# Patient Record
Sex: Female | Born: 1940
Health system: Southern US, Community
[De-identification: ages and names within clinical notes are randomized; demographics above are authoritative.]

## PROBLEM LIST (undated history)

## (undated) DIAGNOSIS — Z8542 Personal history of malignant neoplasm of other parts of uterus: Secondary | ICD-10-CM

## (undated) DIAGNOSIS — N182 Chronic kidney disease, stage 2 (mild): Secondary | ICD-10-CM

## (undated) DIAGNOSIS — M1990 Unspecified osteoarthritis, unspecified site: Secondary | ICD-10-CM

## (undated) DIAGNOSIS — E119 Type 2 diabetes mellitus without complications: Secondary | ICD-10-CM

## (undated) DIAGNOSIS — Z801 Family history of malignant neoplasm of trachea, bronchus and lung: Secondary | ICD-10-CM

## (undated) DIAGNOSIS — I429 Cardiomyopathy, unspecified: Secondary | ICD-10-CM

## (undated) DIAGNOSIS — I2510 Atherosclerotic heart disease of native coronary artery without angina pectoris: Secondary | ICD-10-CM

## (undated) DIAGNOSIS — Z8049 Family history of malignant neoplasm of other genital organs: Secondary | ICD-10-CM

## (undated) DIAGNOSIS — Z806 Family history of leukemia: Secondary | ICD-10-CM

## (undated) DIAGNOSIS — E785 Hyperlipidemia, unspecified: Secondary | ICD-10-CM

## (undated) DIAGNOSIS — I509 Heart failure, unspecified: Secondary | ICD-10-CM

## (undated) DIAGNOSIS — J449 Chronic obstructive pulmonary disease, unspecified: Secondary | ICD-10-CM

## (undated) DIAGNOSIS — Z87442 Personal history of urinary calculi: Secondary | ICD-10-CM

## (undated) DIAGNOSIS — I10 Essential (primary) hypertension: Secondary | ICD-10-CM

## (undated) DIAGNOSIS — G8929 Other chronic pain: Secondary | ICD-10-CM

## (undated) DIAGNOSIS — I4892 Unspecified atrial flutter: Secondary | ICD-10-CM

## (undated) DIAGNOSIS — M545 Low back pain, unspecified: Secondary | ICD-10-CM

## (undated) DIAGNOSIS — Z808 Family history of malignant neoplasm of other organs or systems: Secondary | ICD-10-CM

## (undated) DIAGNOSIS — I214 Non-ST elevation (NSTEMI) myocardial infarction: Secondary | ICD-10-CM

## (undated) DIAGNOSIS — Z800 Family history of malignant neoplasm of digestive organs: Secondary | ICD-10-CM

## (undated) DIAGNOSIS — C801 Malignant (primary) neoplasm, unspecified: Secondary | ICD-10-CM

## (undated) DIAGNOSIS — K859 Acute pancreatitis without necrosis or infection, unspecified: Secondary | ICD-10-CM

## (undated) DIAGNOSIS — Z9289 Personal history of other medical treatment: Secondary | ICD-10-CM

## (undated) DIAGNOSIS — K869 Disease of pancreas, unspecified: Secondary | ICD-10-CM

## (undated) DIAGNOSIS — K635 Polyp of colon: Secondary | ICD-10-CM

## (undated) DIAGNOSIS — C55 Malignant neoplasm of uterus, part unspecified: Secondary | ICD-10-CM

## (undated) HISTORY — PX: ABDOMINAL HYSTERECTOMY: SHX81

## (undated) HISTORY — DX: Disease of pancreas, unspecified: K86.9

## (undated) HISTORY — DX: Atherosclerotic heart disease of native coronary artery without angina pectoris: I25.10

## (undated) HISTORY — DX: Type 2 diabetes mellitus without complications: E11.9

## (undated) HISTORY — PX: GANGLION CYST EXCISION: SHX1691

## (undated) HISTORY — DX: Personal history of malignant neoplasm of other parts of uterus: Z85.42

## (undated) HISTORY — PX: EXCISIONAL HEMORRHOIDECTOMY: SHX1541

## (undated) HISTORY — DX: Essential (primary) hypertension: I10

## (undated) HISTORY — DX: Hyperlipidemia, unspecified: E78.5

## (undated) HISTORY — PX: TUBAL LIGATION: SHX77

## (undated) HISTORY — DX: Family history of malignant neoplasm of digestive organs: Z80.0

## (undated) HISTORY — PX: DILATION AND CURETTAGE OF UTERUS: SHX78

## (undated) HISTORY — DX: Chronic kidney disease, stage 2 (mild): N18.2

## (undated) HISTORY — DX: Family history of malignant neoplasm of other organs or systems: Z80.8

## (undated) HISTORY — DX: Family history of malignant neoplasm of other genital organs: Z80.49

## (undated) HISTORY — DX: Acute pancreatitis without necrosis or infection, unspecified: K85.90

## (undated) HISTORY — DX: Family history of leukemia: Z80.6

## (undated) HISTORY — PX: NASAL SINUS SURGERY: SHX719

## (undated) HISTORY — PX: CATARACT EXTRACTION W/ INTRAOCULAR LENS  IMPLANT, BILATERAL: SHX1307

## (undated) HISTORY — DX: Family history of malignant neoplasm of trachea, bronchus and lung: Z80.1

## (undated) HISTORY — DX: Polyp of colon: K63.5

---

## 1999-07-14 ENCOUNTER — Emergency Department (HOSPITAL_COMMUNITY): Admission: EM | Admit: 1999-07-14 | Discharge: 1999-07-14 | Payer: Self-pay | Admitting: Emergency Medicine

## 1999-07-15 ENCOUNTER — Encounter: Payer: Self-pay | Admitting: *Deleted

## 2000-06-24 ENCOUNTER — Encounter: Admission: RE | Admit: 2000-06-24 | Discharge: 2000-06-24 | Payer: Self-pay | Admitting: Internal Medicine

## 2000-06-24 ENCOUNTER — Encounter: Payer: Self-pay | Admitting: Internal Medicine

## 2000-12-08 HISTORY — PX: CARDIAC CATHETERIZATION: SHX172

## 2001-03-23 ENCOUNTER — Encounter: Payer: Self-pay | Admitting: Emergency Medicine

## 2001-03-24 ENCOUNTER — Encounter: Payer: Self-pay | Admitting: Internal Medicine

## 2001-03-24 ENCOUNTER — Inpatient Hospital Stay (HOSPITAL_COMMUNITY): Admission: EM | Admit: 2001-03-24 | Discharge: 2001-03-27 | Payer: Self-pay | Admitting: Emergency Medicine

## 2001-03-26 ENCOUNTER — Encounter: Payer: Self-pay | Admitting: Internal Medicine

## 2001-06-25 ENCOUNTER — Encounter: Admission: RE | Admit: 2001-06-25 | Discharge: 2001-06-25 | Payer: Self-pay | Admitting: Internal Medicine

## 2001-06-25 ENCOUNTER — Encounter: Payer: Self-pay | Admitting: Internal Medicine

## 2002-07-25 ENCOUNTER — Encounter: Admission: RE | Admit: 2002-07-25 | Discharge: 2002-07-25 | Payer: Self-pay | Admitting: Obstetrics and Gynecology

## 2002-07-25 ENCOUNTER — Encounter: Payer: Self-pay | Admitting: Internal Medicine

## 2003-09-27 ENCOUNTER — Encounter: Admission: RE | Admit: 2003-09-27 | Discharge: 2003-09-27 | Payer: Self-pay | Admitting: Internal Medicine

## 2003-09-27 ENCOUNTER — Encounter: Payer: Self-pay | Admitting: Internal Medicine

## 2004-10-28 ENCOUNTER — Ambulatory Visit: Payer: Self-pay | Admitting: Internal Medicine

## 2004-11-27 ENCOUNTER — Ambulatory Visit (HOSPITAL_COMMUNITY): Admission: RE | Admit: 2004-11-27 | Discharge: 2004-11-27 | Payer: Self-pay | Admitting: Internal Medicine

## 2005-01-31 ENCOUNTER — Ambulatory Visit: Payer: Self-pay | Admitting: Internal Medicine

## 2005-07-30 ENCOUNTER — Ambulatory Visit: Payer: Self-pay | Admitting: Internal Medicine

## 2005-07-30 ENCOUNTER — Other Ambulatory Visit: Admission: RE | Admit: 2005-07-30 | Discharge: 2005-07-30 | Payer: Self-pay | Admitting: Internal Medicine

## 2007-01-01 ENCOUNTER — Encounter (INDEPENDENT_AMBULATORY_CARE_PROVIDER_SITE_OTHER): Payer: Self-pay | Admitting: Specialist

## 2007-01-01 ENCOUNTER — Ambulatory Visit: Payer: Self-pay | Admitting: Internal Medicine

## 2007-01-01 ENCOUNTER — Ambulatory Visit (HOSPITAL_COMMUNITY): Admission: RE | Admit: 2007-01-01 | Discharge: 2007-01-01 | Payer: Self-pay | Admitting: Internal Medicine

## 2007-01-01 ENCOUNTER — Other Ambulatory Visit: Admission: RE | Admit: 2007-01-01 | Discharge: 2007-01-01 | Payer: Self-pay | Admitting: Internal Medicine

## 2007-01-21 ENCOUNTER — Encounter: Admission: RE | Admit: 2007-01-21 | Discharge: 2007-01-21 | Payer: Self-pay | Admitting: Internal Medicine

## 2007-01-21 ENCOUNTER — Ambulatory Visit: Payer: Self-pay | Admitting: Internal Medicine

## 2007-02-13 ENCOUNTER — Emergency Department (HOSPITAL_COMMUNITY): Admission: EM | Admit: 2007-02-13 | Discharge: 2007-02-13 | Payer: Self-pay | Admitting: Emergency Medicine

## 2007-03-01 ENCOUNTER — Ambulatory Visit: Payer: Self-pay | Admitting: Internal Medicine

## 2007-03-04 ENCOUNTER — Encounter: Admission: RE | Admit: 2007-03-04 | Discharge: 2007-03-04 | Payer: Self-pay | Admitting: Internal Medicine

## 2007-04-12 ENCOUNTER — Ambulatory Visit: Payer: Self-pay | Admitting: Internal Medicine

## 2007-06-28 ENCOUNTER — Telehealth: Payer: Self-pay | Admitting: Internal Medicine

## 2007-07-13 ENCOUNTER — Ambulatory Visit: Payer: Self-pay | Admitting: Internal Medicine

## 2007-07-13 DIAGNOSIS — I10 Essential (primary) hypertension: Secondary | ICD-10-CM | POA: Insufficient documentation

## 2007-07-13 DIAGNOSIS — E119 Type 2 diabetes mellitus without complications: Secondary | ICD-10-CM

## 2007-07-13 DIAGNOSIS — Z794 Long term (current) use of insulin: Secondary | ICD-10-CM

## 2007-07-13 DIAGNOSIS — IMO0001 Reserved for inherently not codable concepts without codable children: Secondary | ICD-10-CM | POA: Insufficient documentation

## 2007-07-13 HISTORY — DX: Essential (primary) hypertension: I10

## 2007-07-13 HISTORY — DX: Type 2 diabetes mellitus without complications: E11.9

## 2007-08-30 ENCOUNTER — Telehealth (INDEPENDENT_AMBULATORY_CARE_PROVIDER_SITE_OTHER): Payer: Self-pay | Admitting: *Deleted

## 2007-09-07 ENCOUNTER — Ambulatory Visit: Payer: Self-pay | Admitting: Internal Medicine

## 2007-09-07 DIAGNOSIS — R609 Edema, unspecified: Secondary | ICD-10-CM | POA: Insufficient documentation

## 2007-10-20 ENCOUNTER — Encounter: Payer: Self-pay | Admitting: Internal Medicine

## 2007-11-03 ENCOUNTER — Ambulatory Visit: Payer: Self-pay | Admitting: Internal Medicine

## 2007-12-24 ENCOUNTER — Ambulatory Visit: Payer: Self-pay | Admitting: Internal Medicine

## 2007-12-24 DIAGNOSIS — R0789 Other chest pain: Secondary | ICD-10-CM | POA: Insufficient documentation

## 2007-12-24 DIAGNOSIS — I2510 Atherosclerotic heart disease of native coronary artery without angina pectoris: Secondary | ICD-10-CM | POA: Insufficient documentation

## 2007-12-24 DIAGNOSIS — E785 Hyperlipidemia, unspecified: Secondary | ICD-10-CM | POA: Insufficient documentation

## 2007-12-24 DIAGNOSIS — Z87442 Personal history of urinary calculi: Secondary | ICD-10-CM

## 2007-12-24 HISTORY — DX: Atherosclerotic heart disease of native coronary artery without angina pectoris: I25.10

## 2007-12-24 HISTORY — DX: Personal history of urinary calculi: Z87.442

## 2007-12-24 HISTORY — DX: Hyperlipidemia, unspecified: E78.5

## 2008-01-26 ENCOUNTER — Ambulatory Visit: Payer: Self-pay | Admitting: Internal Medicine

## 2008-01-26 DIAGNOSIS — M25559 Pain in unspecified hip: Secondary | ICD-10-CM | POA: Insufficient documentation

## 2008-03-20 ENCOUNTER — Encounter: Admission: RE | Admit: 2008-03-20 | Discharge: 2008-03-20 | Payer: Self-pay | Admitting: Internal Medicine

## 2008-05-23 ENCOUNTER — Ambulatory Visit: Payer: Self-pay | Admitting: Internal Medicine

## 2008-06-20 ENCOUNTER — Ambulatory Visit: Payer: Self-pay | Admitting: Internal Medicine

## 2008-10-03 ENCOUNTER — Ambulatory Visit: Payer: Self-pay | Admitting: Internal Medicine

## 2009-01-05 ENCOUNTER — Ambulatory Visit: Payer: Self-pay | Admitting: Internal Medicine

## 2009-01-05 LAB — CONVERTED CEMR LAB: Hgb A1c MFr Bld: 8.2 % — ABNORMAL HIGH (ref 4.6–6.1)

## 2009-03-06 ENCOUNTER — Telehealth: Payer: Self-pay | Admitting: Internal Medicine

## 2009-03-06 ENCOUNTER — Encounter: Payer: Self-pay | Admitting: Internal Medicine

## 2009-04-09 ENCOUNTER — Encounter: Admission: RE | Admit: 2009-04-09 | Discharge: 2009-04-09 | Payer: Self-pay | Admitting: Internal Medicine

## 2009-04-09 ENCOUNTER — Ambulatory Visit: Payer: Self-pay | Admitting: Internal Medicine

## 2009-04-12 ENCOUNTER — Ambulatory Visit: Payer: Self-pay | Admitting: Internal Medicine

## 2009-05-25 ENCOUNTER — Telehealth: Payer: Self-pay | Admitting: Internal Medicine

## 2009-07-09 ENCOUNTER — Ambulatory Visit: Payer: Self-pay | Admitting: Internal Medicine

## 2009-08-06 ENCOUNTER — Telehealth: Payer: Self-pay | Admitting: Internal Medicine

## 2009-08-27 ENCOUNTER — Encounter (INDEPENDENT_AMBULATORY_CARE_PROVIDER_SITE_OTHER): Payer: Self-pay

## 2009-09-10 ENCOUNTER — Telehealth: Payer: Self-pay | Admitting: Internal Medicine

## 2009-10-09 ENCOUNTER — Ambulatory Visit: Payer: Self-pay | Admitting: Internal Medicine

## 2009-11-14 ENCOUNTER — Telehealth (INDEPENDENT_AMBULATORY_CARE_PROVIDER_SITE_OTHER): Payer: Self-pay

## 2009-11-23 ENCOUNTER — Telehealth (INDEPENDENT_AMBULATORY_CARE_PROVIDER_SITE_OTHER): Payer: Self-pay

## 2009-12-08 HISTORY — PX: COLONOSCOPY W/ BIOPSIES AND POLYPECTOMY: SHX1376

## 2009-12-13 ENCOUNTER — Telehealth: Payer: Self-pay | Admitting: Internal Medicine

## 2009-12-31 ENCOUNTER — Telehealth: Payer: Self-pay | Admitting: Internal Medicine

## 2010-01-10 ENCOUNTER — Ambulatory Visit: Payer: Self-pay | Admitting: Internal Medicine

## 2010-01-10 LAB — CONVERTED CEMR LAB
Glucose, Urine, Semiquant: >=1000
Nitrite: NEGATIVE
Protein, U semiquant: 100
Specific Gravity, Urine: >=1.03
Urobilinogen, UA: 0.2
pH: 5

## 2010-01-25 ENCOUNTER — Encounter: Payer: Self-pay | Admitting: Internal Medicine

## 2010-04-12 ENCOUNTER — Ambulatory Visit: Payer: Self-pay | Admitting: Internal Medicine

## 2010-04-12 ENCOUNTER — Encounter (INDEPENDENT_AMBULATORY_CARE_PROVIDER_SITE_OTHER): Payer: Self-pay | Admitting: *Deleted

## 2010-04-12 ENCOUNTER — Encounter: Admission: RE | Admit: 2010-04-12 | Discharge: 2010-04-12 | Payer: Self-pay | Admitting: Internal Medicine

## 2010-04-12 LAB — CONVERTED CEMR LAB: Hgb A1c MFr Bld: 7.3 % — ABNORMAL HIGH (ref <5.7)

## 2010-04-17 ENCOUNTER — Encounter (INDEPENDENT_AMBULATORY_CARE_PROVIDER_SITE_OTHER): Payer: Self-pay | Admitting: *Deleted

## 2010-04-19 ENCOUNTER — Ambulatory Visit: Payer: Self-pay | Admitting: Internal Medicine

## 2010-04-19 ENCOUNTER — Encounter (INDEPENDENT_AMBULATORY_CARE_PROVIDER_SITE_OTHER): Payer: Self-pay | Admitting: *Deleted

## 2010-04-24 ENCOUNTER — Telehealth (INDEPENDENT_AMBULATORY_CARE_PROVIDER_SITE_OTHER): Payer: Self-pay | Admitting: *Deleted

## 2010-04-29 ENCOUNTER — Ambulatory Visit: Payer: Self-pay | Admitting: Internal Medicine

## 2010-04-29 DIAGNOSIS — K635 Polyp of colon: Secondary | ICD-10-CM

## 2010-04-29 HISTORY — DX: Polyp of colon: K63.5

## 2010-04-29 LAB — HM COLONOSCOPY

## 2010-04-30 ENCOUNTER — Encounter: Payer: Self-pay | Admitting: Internal Medicine

## 2010-05-29 ENCOUNTER — Telehealth: Payer: Self-pay | Admitting: Internal Medicine

## 2010-07-12 ENCOUNTER — Ambulatory Visit: Payer: Self-pay | Admitting: Internal Medicine

## 2010-08-20 ENCOUNTER — Encounter: Payer: Self-pay | Admitting: Internal Medicine

## 2010-08-21 ENCOUNTER — Encounter: Payer: Self-pay | Admitting: Internal Medicine

## 2010-10-10 ENCOUNTER — Ambulatory Visit: Payer: Self-pay | Admitting: Internal Medicine

## 2010-10-10 DIAGNOSIS — R82998 Other abnormal findings in urine: Secondary | ICD-10-CM | POA: Insufficient documentation

## 2010-10-10 LAB — CONVERTED CEMR LAB
Ketones, urine, test strip: NEGATIVE
Specific Gravity, Urine: 1.02
Urobilinogen, UA: 0.2

## 2010-11-25 ENCOUNTER — Telehealth: Payer: Self-pay | Admitting: Internal Medicine

## 2010-12-29 ENCOUNTER — Encounter: Payer: Self-pay | Admitting: Emergency Medicine

## 2011-01-02 ENCOUNTER — Encounter: Payer: Self-pay | Admitting: Internal Medicine

## 2011-01-05 LAB — CONVERTED CEMR LAB
AST: 18 units/L (ref 0–37)
Basophils Absolute: 0 10*3/uL (ref 0.0–0.1)
Bilirubin, Direct: 0.2 mg/dL (ref 0.0–0.3)
CO2: 30 meq/L (ref 19–32)
Calcium: 9.4 mg/dL (ref 8.4–10.5)
Chloride: 107 meq/L (ref 96–112)
Cholesterol: 123 mg/dL (ref 0–200)
Eosinophils Relative: 1.6 % (ref 0.0–5.0)
GFR calc non Af Amer: 76 mL/min
Glucose, Bld: 223 mg/dL — ABNORMAL HIGH (ref 70–99)
HCT: 33.3 % — ABNORMAL LOW (ref 36.0–46.0)
HCT: 39.1 % (ref 36.0–46.0)
HDL: 24.7 mg/dL — ABNORMAL LOW (ref >39.0)
Hgb A1c MFr Bld: 12.9 % — ABNORMAL HIGH (ref 4.6–6.0)
LDL Cholesterol: 66 mg/dL (ref 0–99)
Lymphocytes Relative: 20.4 % (ref 12.0–46.0)
MCHC: 34.7 g/dL (ref 30.0–36.0)
MCHC: 35.6 g/dL (ref 30.0–36.0)
MCV: 83.5 fL (ref 78.0–100.0)
Monocytes Absolute: 0.6 10*3/uL (ref 0.2–0.7)
Monocytes Relative: 8.6 % (ref 3.0–11.0)
Neutro Abs: 5.2 10*3/uL (ref 1.4–7.7)
Neutrophils Relative %: 69.3 % (ref 43.0–77.0)
Platelets: 198 10*3/uL (ref 150–400)
RBC: 3.99 M/uL (ref 3.87–5.11)
RDW: 12.6 % (ref 11.5–14.6)
Sodium: 143 meq/L (ref 135–145)
TSH: 0.78 microintl units/mL (ref 0.35–5.50)
Total Bilirubin: 0.9 mg/dL (ref 0.3–1.2)
Total CHOL/HDL Ratio: 5
Total Protein: 6.4 g/dL (ref 6.0–8.3)
Triglycerides: 160 mg/dL — ABNORMAL HIGH (ref 0–149)
WBC: 4.6 10*3/uL (ref 4.5–10.5)

## 2011-01-07 NOTE — Progress Notes (Signed)
Summary: prep ?  Phone Note Call from Patient Call back at Work Phone [phone]   Caller: Patient Call For: Dr. Henrene Pastor Reason for Call: Talk to Nurse Summary of Call: cannot afford prep Initial call taken by: Lucien Mons,  Apr 24, 2010 8:24 AM  Follow-up for Phone Call        Pt. states she cannot afford Moviprep even with the rebate coupon.  Compliment Movi Prep given to pt.  Follow-up by: Emerson Monte RN,  Apr 24, 2010 9:56 AM

## 2011-01-07 NOTE — Miscellaneous (Signed)
Summary: [facility] PV.  Clinical Lists Changes  Medications: Added new medication of MOVIPREP 100 GM  SOLR (PEG-KCL-NACL-NASULF-NA ASC-C) As per prep instructions. - Signed Rx of MOVIPREP 100 GM  SOLR (PEG-KCL-NACL-NASULF-NA ASC-C) As per prep instructions.;  #1 x 0;  Signed;  Entered by: Ulice Dash RN;  Authorized by: Irene Shipper MD;  Method used: Electronically to [facility] [phone]*, [address], [place], [place]  [address], Ph: [phone], Fax: [phone] Observations: Added new observation of NKA: T (04/19/2010 16:20)    Prescriptions: MOVIPREP 100 GM  SOLR (PEG-KCL-NACL-NASULF-NA ASC-C) As per prep instructions.  #1 x 0   Entered by:   Ulice Dash RN   Authorized by:   Irene Shipper MD   Signed by:   Ulice Dash RN on 04/19/2010   Method used:   Electronically to        [facility] [phone]* (retail)       [address]       [place], [place]  [address]       Ph: [phone]       Fax: [phone]   RxID:   [id number]

## 2011-01-07 NOTE — Miscellaneous (Signed)
Summary: Flu Shot/[facility]  [facility]   Imported By: Laural Benes 08/26/2010 09:30:36  _____________________________________________________________________  External Attachment:    Type:   Image     Comment:   External Document

## 2011-01-07 NOTE — Progress Notes (Signed)
Summary: lost rx - hydrocodone-acetamin.  Phone Note Call from Patient Call back at Work Phone [phone]   Caller: Patient Call For: Catherine Lor  MD Summary of Call: pt lost rx hydrocodone Initial call taken by: Glo Herring,  May 29, 2010 10:27 AM  Follow-up for Phone Call        #90 Follow-up by: Catherine Lor  MD,  May 29, 2010 12:19 PM  Additional Follow-up for Phone Call Additional follow up Details #1::        spoke with pt - informed that I would call in #90 but no RF per Dr. Burnice Logan - she states she is using 3 every day - I told her he may want to she her for future rf since this is as needed and she is using daily at least three times a day - verbalixed understanding.   [facility]. KIK Additional Follow-up by: Cay Schillings LPN,  June 22, 624THL 624THL PM    Prescriptions: HYDROCODONE-ACETAMINOPHEN 5-500 MG  TABS (HYDROCODONE-ACETAMINOPHEN) one every 6 hours for pain  #90 x 0   Entered by:   Cay Schillings LPN   Authorized by:   Catherine Lor  MD   Signed by:   Cay Schillings LPN on [id number]   Method used:   Historical   RxIDVK:[id number]

## 2011-01-07 NOTE — Assessment & Plan Note (Signed)
Summary: 3 month rov/njr   Vital Signs:  Patient profile:   70 year old female Weight:      193 pounds Temp:     98.1 degrees F oral BP sitting:   110 / 62  (left arm) Cuff size:   regular  Vitals Entered By: Cay Schillings LPN (May  6, 624THL 579FGE PM) CC: 3 mos rov - doing well     fbs 139 Is Patient Diabetic? Yes Did you bring your meter with you today? No   CC:  3 mos rov - doing well     fbs 139.  History of Present Illness:  70 year old patient who is seen today for follow up of her type 2 diabetes.  Medical regimen now includes Byetta.  She has hypertension, coronary artery disease, dyslipidemia.  She denies any cardiopulmonary complaints. No screening colonoscopy in excess of 10 years. She remains on simvastatin, which he tolerates well.  She does continue to have occasional nausea with Byetta  Preventive Screening-Counseling & Management  Alcohol-Tobacco     Smoking Status: quit  Allergies (verified): No Known Drug Allergies  Past History:  Past Medical History: Reviewed history from 12/24/2007 and no changes required. Diabetes mellitus, type II Hypertension Coronary artery disease Hyperlipidemia thyroid not dural Nephrolithiasis, hx of  Past Surgical History: Reviewed history from 12/24/2007 and no changes required. Hemorrhoidectomy Hysterectomy Sinus surgery heart catheterization 2002  Family History: Reviewed history from 12/24/2007 and no changes required. father died age 24, throat cancer, history of COPD  Four brothers, one died of throat cancer.  Another suicide death 74 sisters, one died of uterine cancer; history of breast cancer  Review of Systems       The patient complains of weight gain.  The patient denies anorexia, fever, weight loss, vision loss, decreased hearing, hoarseness, chest pain, syncope, dyspnea on exertion, peripheral edema, prolonged cough, headaches, hemoptysis, abdominal pain, melena, hematochezia, severe  indigestion/heartburn, hematuria, incontinence, genital sores, muscle weakness, suspicious skin lesions, transient blindness, difficulty walking, depression, unusual weight change, abnormal bleeding, enlarged lymph nodes, angioedema, and breast masses.    Physical Exam  General:  overweight-appearing.   130/60overweight-appearing.   Head:  Normocephalic and atraumatic without obvious abnormalities. No apparent alopecia or balding. Eyes:  No corneal or conjunctival inflammation noted. EOMI. Perrla. Funduscopic exam benign, without hemorrhages, exudates or papilledema. Vision grossly normal. Ears:  External ear exam shows no significant lesions or deformities.  Otoscopic examination reveals clear canals, tympanic membranes are intact bilaterally without bulging, retraction, inflammation or discharge. Hearing is grossly normal bilaterally. Mouth:  Oral mucosa and oropharynx without lesions or exudates.  Teeth in good repair. Neck:  No deformities, masses, or tenderness noted. Lungs:  Normal respiratory effort, chest expands symmetrically. Lungs are clear to auscultation, no crackles or wheezes. Heart:  Normal rate and regular rhythm. S1 and S2 normal without gallop, murmur, click, rub or other extra sounds. Abdomen:  Bowel sounds positive,abdomen soft and non-tender without masses, organomegaly or hernias noted. Msk:  No deformity or scoliosis noted of thoracic or lumbar spine.   Extremities:  No clubbing, cyanosis, edema, or deformity noted with normal full range of motion of all joints.   Skin:  Intact without suspicious lesions or rashes Cervical Nodes:  No lymphadenopathy noted Axillary Nodes:  No palpable lymphadenopathy Inguinal Nodes:  No significant adenopathy   Impression & Recommendations:  Problem # 1:  CORONARY ARTERY DISEASE (ICD-414.00)  The following medications were removed from the medication list:    Furosemide  40 Mg Tabs (Furosemide) ..... One daily Her updated medication  list for this problem includes:    Bayer Aspirin 325 Mg Tabs (Aspirin) .Marland Kitchen... Take 1 tablet by mouth once a day    Furosemide 40 Mg Tabs (Furosemide) ..... One daily    Benazepril Hcl 40 Mg Tabs (Benazepril hcl) ..... One daily    Amlodipine Besylate 5 Mg Tabs (Amlodipine besylate) ..... One daily  The following medications were removed from the medication list:    Furosemide 40 Mg Tabs (Furosemide) ..... One daily Her updated medication list for this problem includes:    Bayer Aspirin 325 Mg Tabs (Aspirin) .Marland Kitchen... Take 1 tablet by mouth once a day    Furosemide 40 Mg Tabs (Furosemide) ..... One daily    Benazepril Hcl 40 Mg Tabs (Benazepril hcl) ..... One daily    Amlodipine Besylate 5 Mg Tabs (Amlodipine besylate) ..... One daily  Problem # 2:  HYPERLIPIDEMIA (ICD-272.4)  Her updated medication list for this problem includes:    Simvastatin 20 Mg Tabs (Simvastatin) .Marland Kitchen... Take 1 tablet by mouth every night  Her updated medication list for this problem includes:    Simvastatin 20 Mg Tabs (Simvastatin) .Marland Kitchen... Take 1 tablet by mouth every night  Problem # 3:  HYPERTENSION (ICD-401.9)  The following medications were removed from the medication list:    Furosemide 40 Mg Tabs (Furosemide) ..... One daily Her updated medication list for this problem includes:    Furosemide 40 Mg Tabs (Furosemide) ..... One daily    Benazepril Hcl 40 Mg Tabs (Benazepril hcl) ..... One daily    Amlodipine Besylate 5 Mg Tabs (Amlodipine besylate) ..... One daily  The following medications were removed from the medication list:    Furosemide 40 Mg Tabs (Furosemide) ..... One daily Her updated medication list for this problem includes:    Furosemide 40 Mg Tabs (Furosemide) ..... One daily    Benazepril Hcl 40 Mg Tabs (Benazepril hcl) ..... One daily    Amlodipine Besylate 5 Mg Tabs (Amlodipine besylate) ..... One daily  Problem # 4:  DIABETES MELLITUS, TYPE II (ICD-250.00)  Her updated medication list for  this problem includes:    Bayer Aspirin 325 Mg Tabs (Aspirin) .Marland Kitchen... Take 1 tablet by mouth once a day    Glucophage 1000 Mg Tabs (Metformin hcl) .Marland Kitchen... 1 two times a day    Glyburide 5 Mg Tabs (Glyburide) .Marland Kitchen... 1 two times a day    Benazepril Hcl 40 Mg Tabs (Benazepril hcl) ..... One daily    Byetta 10 Mcg Pen 10 Mcg/0.68ml Soln (Exenatide) ..... Use twice daily    Her updated medication list for this problem includes:    Bayer Aspirin 325 Mg Tabs (Aspirin) .Marland Kitchen... Take 1 tablet by mouth once a day    Glucophage 1000 Mg Tabs (Metformin hcl) .Marland Kitchen... 1 two times a day    Glyburide 5 Mg Tabs (Glyburide) .Marland Kitchen... 1 two times a day    Benazepril Hcl 40 Mg Tabs (Benazepril hcl) ..... One daily    Byetta 10 Mcg Pen 10 Mcg/0.63ml Soln (Exenatide) ..... Use twice daily  Orders: Venipuncture [id number]) TLB-A1C / Hgb A1C (Glycohemoglobin) (83036-A1C) Prescription Created Electronically [phone])  Complete Medication List: 1)  Bayer Aspirin 325 Mg Tabs (Aspirin) .... Take 1 tablet by mouth once a day 2)  Glucophage 1000 Mg Tabs (Metformin hcl) .Marland Kitchen.. 1 two times a day 3)  Glyburide 5 Mg Tabs (Glyburide) .Marland Kitchen.. 1 two times a day 4)  Lorazepam 0.5 Mg Tabs (  Lorazepam) .... Q6h as needed 5)  Simvastatin 20 Mg Tabs (Simvastatin) .... Take 1 tablet by mouth every night 6)  Furosemide 40 Mg Tabs (Furosemide) .... One daily 7)  Benazepril Hcl 40 Mg Tabs (Benazepril hcl) .... One daily 8)  Freestyle Lite Strp (Glucose blood) 9)  Meloxicam 7.5 Mg Tabs (Meloxicam) .... One twice daily as needed 10)  Hydrocodone-acetaminophen 5-500 Mg Tabs (Hydrocodone-acetaminophen) .... One every 6 hours for pain 11)  Bd Insulin Syringe Ultrafine 31g X 5/16" 0.3 Ml Misc (Insulin syringe-needle u-100) .... Use as directed 12)  Onetouch Ultra Test Strp (Glucose blood) .... Use daily 13)  Amlodipine Besylate 5 Mg Tabs (Amlodipine besylate) .... One daily 14)  Byetta 10 Mcg Pen 10 Mcg/0.107ml Soln (Exenatide) .... Use twice daily 15)   Ciprofloxacin Hcl 500 Mg Tabs (Ciprofloxacin hcl) .... One twice daily  Other Orders: Gastroenterology Referral (GI)  Patient Instructions: 1)  Please schedule a follow-up appointment in 3 months. 2)  Limit your Sodium (Salt). 3)  It is important that you exercise regularly at least 20 minutes 5 times a week. If you develop chest pain, have severe difficulty breathing, or feel very tired , stop exercising immediately and seek medical attention. 4)  You need to lose weight. Consider a lower calorie diet and regular exercise.  5)  Check your blood sugars regularly. If your readings are usually above : or below 70 you should contact our office. 6)  It is important that your Diabetic A1c level is checked every 3 months. 7)  See your eye doctor yearly to check for diabetic eye damage. Prescriptions: HYDROCODONE-ACETAMINOPHEN 5-500 MG  TABS (HYDROCODONE-ACETAMINOPHEN) one every 6 hours for pain  #90 x 3   Entered and Authorized by:   Marletta Lor  MD   Signed by:   Marletta Lor  MD on 04/12/2010   Method used:   Print then Give to Patient   RxID:   [id number] LORAZEPAM 0.5 MG TABS (LORAZEPAM) q6h as needed  #50 x 2   Entered and Authorized by:   Marletta Lor  MD   Signed by:   Marletta Lor  MD on 04/12/2010   Method used:   Print then Give to Patient   RxID:   [id number] BYETTA 10 MCG PEN 10 MCG/0.04ML SOLN (EXENATIDE) use twice daily  #3 pens x 6   Entered and Authorized by:   Marletta Lor  MD   Signed by:   Marletta Lor  MD on 04/12/2010   Method used:   Electronically to        [facility] [phone]* (retail)       [place], [place]  [address]       Ph: [phone]       Fax: [phone]   RxIDNL:[id number] AMLODIPINE BESYLATE 5 MG TABS (AMLODIPINE BESYLATE) one daily  #90 x 4   Entered and Authorized by:   Marletta Lor  MD   Signed by:   Marletta Lor  MD on 04/12/2010   Method used:    Electronically to        [facility] [phone]* (retail)       [address]       [place], [place]  [address]       Ph: [phone]       Fax: [phone]   RxID:   [id number] Donald Siva  TEST   STRP (GLUCOSE BLOOD) use daily  #180 x 6   Entered and Authorized by:   Marletta Lor  MD   Signed by:   Marletta Lor  MD on 04/12/2010   Method used:   Electronically to        [facility] [phone]* (retail)       [address]       [place], [place]  [address]       Ph: [phone]       Fax: [phone]   RxID:   [id number] BD INSULIN SYRINGE ULTRAFINE 31G X 5/16" 0.3 ML  MISC (INSULIN SYRINGE-NEEDLE U-100) use as directed  #180 x 6   Entered and Authorized by:   Marletta Lor  MD   Signed by:   Marletta Lor  MD on 04/12/2010   Method used:   Electronically to        [facility] [phone]* (retail)       [address]       [place], [place]  [address]       Ph: [phone]       Fax: [phone]   [id number] MELOXICAM 7.5 MG  TABS (MELOXICAM) one twice daily as needed  #90 x 2   Entered and Authorized by:   Marletta Lor  MD   Signed by:   Marletta Lor  MD on 04/12/2010   Method used:   Electronically to        [facility] [phone]* (retail)       [place], [place]  [address]       Ph: [phone]       Fax: [phone]   RxID:   [id number] FREESTYLE LITE   STRP (GLUCOSE BLOOD)   #180 x 6   Entered and Authorized by:   Marletta Lor  MD   Signed by:   Marletta Lor  MD on 04/12/2010   Method used:   Print then Give to Patient   RxID:   [id number] BENAZEPRIL HCL 40 MG  TABS (BENAZEPRIL HCL) one daily  #90 Tablet x 4   Entered and Authorized by:   Marletta Lor  MD   Signed by:   Marletta Lor  MD on 04/12/2010   Method used:   Electronically to        [facility] [phone]* (retail)       [place], [place]   [address]       Ph: [phone]       Fax: [phone]   [id number] FUROSEMIDE 40 MG  TABS (FUROSEMIDE) one daily  #90 x 6   Entered and Authorized by:   Marletta Lor  MD   Signed by:   Marletta Lor  MD on 04/12/2010   Method used:   Electronically to        [facility] [phone]* (retail)       [place], [place]  [address]       Ph: [phone]       Fax: [phone]   [id number] SIMVASTATIN 20 MG TABS (SIMVASTATIN) Take 1 tablet by mouth every night  #90 x 6   Entered and Authorized by:  Marletta Lor  MD   Signed by:   Marletta Lor  MD on 04/12/2010   Method used:   Electronically to        [facility] [phone]* (retail)       [place], [place]  [address]       Ph: [phone]       Fax: [phone]   RxID:   [id number] GLYBURIDE 5 MG TABS (GLYBURIDE) 1 two times a day  #180 Tablet x 4   Entered and Authorized by:   Marletta Lor  MD   Signed by:   Marletta Lor  MD on 04/12/2010   Method used:   Electronically to        [facility] [phone]* (retail)       [place], [place]  [address]       Ph: [phone]       Fax: [phone]   RxIDZA:[id number] GLUCOPHAGE 1000 MG TABS (METFORMIN HCL) 1 two times a day  #180 Tablet x 4   Entered and Authorized by:   Marletta Lor  MD   Signed by:   Marletta Lor  MD on 04/12/2010   Method used:   Electronically to        [facility] [phone]* (retail)       [address]       [place], [place]  [address]       Ph: [phone]       Fax: [phone]   RxID:   [id number]

## 2011-01-07 NOTE — Progress Notes (Signed)
Summary: REQ FOR SAMPLES  Phone Note Call from Patient   Caller: Patient @ [phone] Reason for Call: Talk to Nurse Summary of Call: Pt called to speak with Dr Marthann Schiller Nurse Bethena Roys, RN)..... Pt req addtitional samples of diabetes pen (Victoza)...... Pt can be reached at [phone] with any questions or concerns.  Initial call taken by: Duanne Moron,  December 13, 2009 8:54 AM  Follow-up for Phone Call        Pt called back to check on status of samples. Please call today asap. [id number] Follow-up by: Braulio Bosch,  December 14, 2009 12:47 PM  Additional Follow-up for Phone Call Additional follow up Details #1::        none here, Rx called in Additional Follow-up by: Chipper Oman, RN,  December 14, 2009 1:03 PM    Prescriptions: VICTOZA 18 MG/3ML SOLN (LIRAGLUTIDE) 0.6 mg daily  #1 x 6   Entered by:   Chipper Oman, RN   Authorized by:   Marletta Lor  MD   Signed by:   Chipper Oman, RN on 12/14/2009   Method used:   Electronically to        [facility] [phone]* (retail)       [address]       [place], [place]  [address]       Ph: [phone]       Fax: [phone]   [id number]

## 2011-01-07 NOTE — Letter (Signed)
Summary: Moviprep Instructions  [facility]  [address]   [place], [place] [address]   Phone: [phone]  Fax: [phone]       TOLEEN SPRADLIN    1941/03/02    MRN: [id number]        Procedure Day Sudie Grumbling: Monday, 04-29-10     Arrival Time: 9:30 a.m.     Procedure Time: 10:30 a.m.     Location of Procedure:                    x   [facility] (4th Floor)                        [place]   Starting 5 days prior to your procedure 04-24-10 do not eat nuts, seeds, popcorn, corn, beans, peas,  salads, or any raw vegetables.  Do not take any fiber supplements (e.g. Metamucil, Citrucel, and Benefiber).  THE DAY BEFORE YOUR PROCEDURE         DATE: 04-28-10    DAY: Sunday  1.  Drink clear liquids the entire day-NO SOLID FOOD  2.  Do not drink anything colored red or purple.  Avoid juices with pulp.  No orange juice.  3.  Drink at least 64 oz. (8 glasses) of fluid/clear liquids during the day to prevent dehydration and help the prep work efficiently.  CLEAR LIQUIDS INCLUDE: Water Jello Ice Popsicles Tea (sugar ok, no milk/cream) Powdered fruit flavored drinks Coffee (sugar ok, no milk/cream) Gatorade Juice: apple, white grape, white cranberry  Lemonade Clear bullion, consomm, broth Carbonated beverages (any kind) Strained chicken noodle soup Hard Candy                             4.  In the morning, mix first dose of MoviPrep solution:    Empty 1 Pouch A and 1 Pouch B into the disposable container    Add lukewarm drinking water to the top line of the container. Mix to dissolve    Refrigerate (mixed solution should be used within 24 hrs)  5.  Begin drinking the prep at 5:00 p.m. The MoviPrep container is divided by 4 marks.   Every 15 minutes drink the solution down to the next mark (approximately 8 oz) until the full liter is complete.   6.  Follow completed prep with 16 oz of clear liquid of your choice  (Nothing red or purple).  Continue to drink clear liquids until bedtime.  7.  Before going to bed, mix second dose of MoviPrep solution:    Empty 1 Pouch A and 1 Pouch B into the disposable container    Add lukewarm drinking water to the top line of the container. Mix to dissolve    Refrigerate  THE DAY OF YOUR PROCEDURE      DATE: 04-29-10  DAY: Monday  Beginning at  5:30 a.m. (5 hours before procedure):         1. Every 15 minutes, drink the solution down to the next mark (approx 8 oz) until the full liter is complete.  2. Follow completed prep with 16 oz. of clear liquid of your choice.    3. You may drink clear liquids until  8:30 a.m.  (2 HOURS BEFORE PROCEDURE).   MEDICATION INSTRUCTIONS  Unless otherwise instructed, you should take regular prescription medications with a small sip of water   as  early as possible the morning of your procedure.  Diabetic patients - see separate instructions.   Additional medication instructions:  Do not take Furosemide day of procedure.         OTHER INSTRUCTIONS  You will need a responsible adult at least 69 years of age to accompany you and drive you home.   This person must remain in the waiting room during your procedure.  Wear loose fitting clothing that is easily removed.  Leave jewelry and other valuables at home.  However, you may wish to bring a book to read or  an iPod/MP3 player to listen to music as you wait for your procedure to start.  Remove all body piercing jewelry and leave at home.  Total time from sign-in until discharge is approximately 2-3 hours.  You should go home directly after your procedure and rest.  You can resume normal activities the  day after your procedure.  The day of your procedure you should not:   Drive   Make legal decisions   Operate machinery   Drink alcohol   Return to work  You will receive specific instructions about eating, activities and medications before you  leave.    The above instructions have been reviewed and explained to me by   Ulice Dash RN  Apr 19, 2010 5:11 PM     I fully understand and can verbalize these instructions _____________________________ Date _________

## 2011-01-07 NOTE — Miscellaneous (Signed)
Summary: flu vaccine  Clinical Lists Changes  Observations: Added new observation of FLU VAX: Historical (08/20/2010 9:43)      Immunization History:  Influenza Immunization History:    Influenza:  Historical (08/20/2010)  H1N1 History:    H1N1 # 1:  H1N1 vaccine G code (01/05/2009)

## 2011-01-07 NOTE — Progress Notes (Signed)
Summary: Name of Insulin Pen  Phone Note Other Incoming Call back at [phone]   Caller: Ammie Ferrier [employer]) Summary of Call: I am trying to get patient's insulin injection pen covered under her insurance and she does not know the name of it.  Please call back with the name of this med. Initial call taken by: Candace Cruise,  December 31, 2009 10:54 AM  Follow-up for Phone Call        called patient and told her she needed to give him that info. Follow-up by: Chipper Oman, RN,  December 31, 2009 11:40 AM

## 2011-01-07 NOTE — Letter (Signed)
Summary: Diabetic Instructions  [facility]  [place], [place] [address]   Phone: [phone]  Fax: [phone]    [name] [birth year] MRN: [id number]   _  _   ORAL DIABETIC MEDICATION INSTRUCTIONS  The day before your procedure:   Take your diabetic pill as you do normally  The day of your procedure:   Do not take your diabetic pill    We will check your blood sugar levels during the admission process and again in Recovery before discharging you home  ________________________________________________________________________ Hold Byetta day before procedure and day of procedure.

## 2011-01-07 NOTE — Medication Information (Signed)
Summary: Request for Exception-Vitoza Pen  Request for Exception-Vitoza Pen   Imported By: Laural Benes 01/29/2010 15:42:56  _____________________________________________________________________  External Attachment:    Type:   Image     Comment:   External Document

## 2011-01-07 NOTE — Letter (Signed)
Summary: Previsit letter  [facility]  [place], [place] [address]   Phone: [phone]  Fax: [phone]       04/12/2010 MRN: [id number]  [facility] [place], [place]  [address]  Dear Ms. Arreola,  Welcome to the Gastroenterology Division at [facility].    You are scheduled to see a nurse for your pre-procedure visit on 05-03-10 at 10:30a.m. on the 3rd floor at [facility], [place] [employer].  We ask that you try to arrive at our office 15 minutes prior to your appointment time to allow for check-in.  Your nurse visit will consist of discussing your medical and surgical history, your immediate family medical history, and your medications.    Please bring a complete list of all your medications or, if you prefer, bring the medication bottles and we will list them.  We will need to be aware of both prescribed and over the counter drugs.  We will need to know exact dosage information as well.  If you are on blood thinners (Coumadin, Plavix, Aggrenox, Ticlid, etc.) please call our office today/prior to your appointment, as we need to consult with your physician about holding your medication.   Please be prepared to read and sign documents such as consent forms, a financial agreement, and acknowledgement forms.  If necessary, and with your consent, a friend or relative is welcome to sit-in on the nurse visit with you.  Please bring your insurance card so that we may make a copy of it.  If your insurance requires a referral to see a specialist, please bring your referral form from your primary care physician.  No co-pay is required for this nurse visit.     If you cannot keep your appointment, please call [phone] to cancel or reschedule prior to your appointment date.  This allows [place] the opportunity to schedule an appointment for another patient in need of care.    Thank you for choosing [facility] for your medical needs.   We appreciate the opportunity to care for you.  Please visit [place] at our website  to learn more about our practice.                     Sincerely.                                                                                                                   The Gastroenterology Division

## 2011-01-07 NOTE — Assessment & Plan Note (Signed)
Summary: 3 month rov/njr   Vital Signs:  Patient profile:   70 year old female Weight:      191 pounds Temp:     98.4 degrees F oral BP sitting:   124 / 70  (right arm) Cuff size:   regular  Vitals Entered By: Cay Schillings LPN (August  5, 624THL 4:21 PM) CC: 3 mos rov - doing ok      fbs 87 Is Patient Diabetic? Yes Did you bring your meter with you today? No   CC:  3 mos rov - doing ok      fbs 87.  History of Present Illness:  70 year old patient who is seen today for follow-up of her type 2 diabetes.  She has dyslipidemia and hypertension.  Her last hemoglobin A1c7.3.  She has had some modest weight loss and has done quite well.  Since her last visit here.  She has had colonoscopy.  A fasting blood sugar this morning 89.  No concerns or complaints.  She does have a history of coronary artery disease, which has been stable  Allergies (verified): No Known Drug Allergies  Past History:  Past Medical History: Reviewed history from 12/24/2007 and no changes required. Diabetes mellitus, type II Hypertension Coronary artery disease Hyperlipidemia thyroid not dural Nephrolithiasis, hx of  Review of Systems       The patient complains of weight loss.  The patient denies anorexia, fever, weight gain, vision loss, decreased hearing, hoarseness, chest pain, syncope, dyspnea on exertion, peripheral edema, prolonged cough, headaches, hemoptysis, abdominal pain, melena, hematochezia, severe indigestion/heartburn, hematuria, incontinence, genital sores, muscle weakness, suspicious skin lesions, transient blindness, difficulty walking, depression, unusual weight change, abnormal bleeding, enlarged lymph nodes, angioedema, and breast masses.    Physical Exam  General:  overweight-appearing.  normal blood pressure Head:  Normocephalic and atraumatic without obvious abnormalities. No apparent alopecia or balding. Eyes:  No corneal or conjunctival inflammation noted. EOMI. Perrla.  Funduscopic exam benign, without hemorrhages, exudates or papilledema. Vision grossly normal. Mouth:  Oral mucosa and oropharynx without lesions or exudates.  Teeth in good repair. Neck:  No deformities, masses, or tenderness noted. Lungs:  Normal respiratory effort, chest expands symmetrically. Lungs are clear to auscultation, no crackles or wheezes. Heart:  Normal rate and regular rhythm. S1 and S2 normal without gallop, murmur, click, rub or other extra sounds. Abdomen:  Bowel sounds positive,abdomen soft and non-tender without masses, organomegaly or hernias noted.  Diabetes Management Exam:    Eye Exam:       Eye Exam done here today          Results: normal   Impression & Recommendations:  Problem # 1:  HYPERLIPIDEMIA (P102836.4)  Her updated medication list for this problem includes:    Simvastatin 20 Mg Tabs (Simvastatin) .Marland Kitchen... Take 1 tablet by mouth every night  Problem # 2:  CORONARY ARTERY DISEASE (ICD-414.00)  Her updated medication list for this problem includes:    Bayer Aspirin 325 Mg Tabs (Aspirin) .Marland Kitchen... Take 1 tablet by mouth once a day    Furosemide 40 Mg Tabs (Furosemide) ..... One daily    Benazepril Hcl 40 Mg Tabs (Benazepril hcl) ..... One daily    Amlodipine Besylate 5 Mg Tabs (Amlodipine besylate) ..... One daily  Problem # 3:  DIABETES MELLITUS, TYPE II (ICD-250.00)  Her updated medication list for this problem includes:    Bayer Aspirin 325 Mg Tabs (Aspirin) .Marland Kitchen... Take 1 tablet by mouth once a day  Glucophage 1000 Mg Tabs (Metformin hcl) .Marland Kitchen... 1 two times a day    Glyburide 5 Mg Tabs (Glyburide) .Marland Kitchen... 1 two times a day    Benazepril Hcl 40 Mg Tabs (Benazepril hcl) ..... One daily    Byetta 10 Mcg Pen 10 Mcg/0.80ml Soln (Exenatide) ..... Use twice daily patient declines hemoglobin A1c testing today.  Will check next R. O. the  Problem # 4:  HYPERTENSION (ICD-401.9)  Her updated medication list for this problem includes:    Furosemide 40 Mg Tabs  (Furosemide) ..... One daily    Benazepril Hcl 40 Mg Tabs (Benazepril hcl) ..... One daily    Amlodipine Besylate 5 Mg Tabs (Amlodipine besylate) ..... One daily  Complete Medication List: 1)  Bayer Aspirin 325 Mg Tabs (Aspirin) .... Take 1 tablet by mouth once a day 2)  Glucophage 1000 Mg Tabs (Metformin hcl) .Marland Kitchen.. 1 two times a day 3)  Glyburide 5 Mg Tabs (Glyburide) .Marland Kitchen.. 1 two times a day 4)  Lorazepam 0.5 Mg Tabs (Lorazepam) .... Q6h as needed 5)  Simvastatin 20 Mg Tabs (Simvastatin) .... Take 1 tablet by mouth every night 6)  Furosemide 40 Mg Tabs (Furosemide) .... One daily 7)  Benazepril Hcl 40 Mg Tabs (Benazepril hcl) .... One daily 8)  Freestyle Lite Strp (Glucose blood) 9)  Meloxicam 7.5 Mg Tabs (Meloxicam) .... One twice daily as needed 10)  Hydrocodone-acetaminophen 5-500 Mg Tabs (Hydrocodone-acetaminophen) .... One every 6 hours for pain 11)  Bd Insulin Syringe Ultrafine 31g X 5/16" 0.3 Ml Misc (Insulin syringe-needle u-100) .... Use as directed 12)  Onetouch Ultra Test Strp (Glucose blood) .... Use daily 13)  Amlodipine Besylate 5 Mg Tabs (Amlodipine besylate) .... One daily 14)  Byetta 10 Mcg Pen 10 Mcg/0.36ml Soln (Exenatide) .... Use twice daily 15)  Ciprofloxacin Hcl 500 Mg Tabs (Ciprofloxacin hcl) .... One twice daily  Patient Instructions: 1)  Please schedule a follow-up appointment in 3 months. 2)  Limit your Sodium (Salt). 3)  It is important that you exercise regularly at least 20 minutes 5 times a week. If you develop chest pain, have severe difficulty breathing, or feel very tired , stop exercising immediately and seek medical attention. 4)  You need to lose weight. Consider a lower calorie diet and regular exercise.  5)  Check your blood sugars regularly. If your readings are usually above : or below 70 you should contact our office. 6)  It is important that your Diabetic A1c level is checked every 3 months.

## 2011-01-07 NOTE — Assessment & Plan Note (Signed)
Summary: ROA X 3 MTHS /RS ALSO A1C/NJR   Vital Signs:  Patient profile:   70 year old female Weight:      181 pounds BP sitting:   124 / 84  (left arm) Cuff size:   regular  Vitals Entered By: Chipper Oman, RN (January 10, 2010 4:25 PM) CC: ROV, c/o urine burning skin. BS's 200's- can't afford Victoza. Is Patient Diabetic? Yes   CC:  ROV and c/o urine burning skin. BS's 200's- can't afford Victoza.Marland Kitchen  History of Present Illness:  70 year old patient who is seen today for follow-up of her type 2 diabetes.  She was placed on Victoza 3 months ago, but had a difficult time obtaining insurance coverage.  Her blood sugars did do quite well even on a submaximal dose of the medication.  Blood sugars more recently have been quite high, often in excess of 300 and she has not been able to obtain the medication.  She has hypertension, dyslipidemia, and a history of coronary artery disease.  Otherwise, her status has been stable.  Her only new complaint is burning, dysuria for several days.  She denies any fever, chills, or flank pain.  She denies any exertional chest pain or shortness of breath  Allergies: No Known Drug Allergies  Past History:  Past Medical History: Reviewed history from 12/24/2007 and no changes required. Diabetes mellitus, type II Hypertension Coronary artery disease Hyperlipidemia thyroid not dural Nephrolithiasis, hx of  Family History: Reviewed history from 12/24/2007 and no changes required. father died age 52, throat cancer, history of COPD  Four brothers, one died of throat cancer.  Another suicide death 6 sisters, one died of uterine cancer; history of breast cancer  Social History: Reviewed history from 12/24/2007 and no changes required. Former Smoker  Review of Systems  The patient denies anorexia, fever, weight loss, weight gain, vision loss, decreased hearing, hoarseness, chest pain, syncope, dyspnea on exertion, peripheral edema, prolonged cough,  headaches, hemoptysis, abdominal pain, melena, hematochezia, severe indigestion/heartburn, hematuria, incontinence, genital sores, muscle weakness, suspicious skin lesions, transient blindness, difficulty walking, depression, unusual weight change, abnormal bleeding, enlarged lymph nodes, angioedema, and breast masses.    Physical Exam  General:  overweight-appearing.  no distress.  Blood pressure 140/70 Head:  Normocephalic and atraumatic without obvious abnormalities. No apparent alopecia or balding. Eyes:  No corneal or conjunctival inflammation noted. EOMI. Perrla. Funduscopic exam benign, without hemorrhages, exudates or papilledema. Vision grossly normal. Mouth:  Oral mucosa and oropharynx without lesions or exudates.  Teeth in good repair. Neck:  No deformities, masses, or tenderness noted. Lungs:  Normal respiratory effort, chest expands symmetrically. Lungs are clear to auscultation, no crackles or wheezes. Heart:  Normal rate and regular rhythm. S1 and S2 normal without gallop, murmur, click, rub or other extra sounds. Abdomen:  Bowel sounds positive,abdomen soft and non-tender without masses, organomegaly or hernias noted. Msk:  No deformity or scoliosis noted of thoracic or lumbar spine.   Pulses:  R and L carotid,radial,femoral,dorsalis pedis and posterior tibial pulses are full and equal bilaterally Extremities:  No clubbing, cyanosis, edema, or deformity noted with normal full range of motion of all joints.     Impression & Recommendations:  Problem # 1:  DIABETES MELLITUS, TYPE II (ICD-250.00)  The following medications were removed from the medication list:    Victoza 18 Mg/51ml Soln (Liraglutide) .Marland Kitchen... 0.6 mg daily Her updated medication list for this problem includes:    Bayer Aspirin 325 Mg Tabs (Aspirin) .Marland Kitchen... Take  1 tablet by mouth once a day    Glucophage 1000 Mg Tabs (Metformin hcl) .Marland Kitchen... 1 two times a day    Glyburide 5 Mg Tabs (Glyburide) .Marland Kitchen... 1 two times a day     Benazepril Hcl 40 Mg Tabs (Benazepril hcl) ..... One daily    Byetta 10 Mcg Pen 10 Mcg/0.73ml Soln (Exenatide) ..... Use twice daily  Orders: UA Dipstick w/o Micro (manual) (81002)  Problem # 2:  CORONARY ARTERY DISEASE (ICD-414.00)  Her updated medication list for this problem includes:    Bayer Aspirin 325 Mg Tabs (Aspirin) .Marland Kitchen... Take 1 tablet by mouth once a day    Furosemide 40 Mg Tabs (Furosemide) ..... One daily    Benazepril Hcl 40 Mg Tabs (Benazepril hcl) ..... One daily    Amlodipine Besylate 5 Mg Tabs (Amlodipine besylate) ..... One daily  Problem # 3:  HYPERTENSION (ICD-401.9)  Her updated medication list for this problem includes:    Furosemide 40 Mg Tabs (Furosemide) ..... One daily    Benazepril Hcl 40 Mg Tabs (Benazepril hcl) ..... One daily    Amlodipine Besylate 5 Mg Tabs (Amlodipine besylate) ..... One daily  Problem # 4:  UTI (ICD-599.0)  Her updated medication list for this problem includes:    Ciprofloxacin Hcl 500 Mg Tabs (Ciprofloxacin hcl) ..... One twice daily  Complete Medication List: 1)  Bayer Aspirin 325 Mg Tabs (Aspirin) .... Take 1 tablet by mouth once a day 2)  Glucophage 1000 Mg Tabs (Metformin hcl) .Marland Kitchen.. 1 two times a day 3)  Glyburide 5 Mg Tabs (Glyburide) .Marland Kitchen.. 1 two times a day 4)  Lorazepam 0.5 Mg Tabs (Lorazepam) .... Q6h as needed 5)  Simvastatin 20 Mg Tabs (Simvastatin) .... Take 1 tablet by mouth every night 6)  Furosemide 40 Mg Tabs (Furosemide) .... One daily 7)  Benazepril Hcl 40 Mg Tabs (Benazepril hcl) .... One daily 8)  Freestyle Lite Strp (Glucose blood) 9)  Meloxicam 7.5 Mg Tabs (Meloxicam) .... One twice daily as needed 10)  Hydrocodone-acetaminophen 5-500 Mg Tabs (Hydrocodone-acetaminophen) .... One every 6 hours for pain 11)  Bd Insulin Syringe Ultrafine 31g X 5/16" 0.3 Ml Misc (Insulin syringe-needle u-100) .... Use as directed 12)  Furosemide 40 Mg Tabs (Furosemide) .... One daily 13)  Onetouch Ultra Test Strp (Glucose blood)  .... Use daily 14)  Amlodipine Besylate 5 Mg Tabs (Amlodipine besylate) .... One daily 15)  Byetta 10 Mcg Pen 10 Mcg/0.26ml Soln (Exenatide) .... Use twice daily 16)  Ciprofloxacin Hcl 500 Mg Tabs (Ciprofloxacin hcl) .... One twice daily  Patient Instructions: 1)  Please schedule a follow-up appointment in 3 months. 2)  Limit your Sodium (Salt). 3)  It is important that you exercise regularly at least 20 minutes 5 times a week. If you develop chest pain, have severe difficulty breathing, or feel very tired , stop exercising immediately and seek medical attention. 4)  You need to lose weight. Consider a lower calorie diet and regular exercise.  5)  Check your blood sugars regularly. If your readings are usually above : or below 70 you should contact our office. 6)  It is important that your Diabetic A1c level is checked every 3 months. 7)  See your eye doctor yearly to check for diabetic eye damage. Prescriptions: CIPROFLOXACIN HCL 500 MG TABS (CIPROFLOXACIN HCL) one twice daily  #1and a4 x 0   Entered and Authorized by:   Marletta Lor  MD   Signed by:   Marletta Lor  MD on 01/10/2010   Method used:   Print then Give to Patient   RxID:   [id number] BYETTA 10 MCG PEN 10 MCG/0.04ML SOLN (EXENATIDE) use twice daily  #3 pens x 6   Entered and Authorized by:   Marletta Lor  MD   Signed by:   Marletta Lor  MD on 01/10/2010   Method used:   Print then Give to Patient   RxID:   [id number] AMLODIPINE BESYLATE 5 MG TABS (AMLODIPINE BESYLATE) one daily  #90 x 4   Entered and Authorized by:   Marletta Lor  MD   Signed by:   Marletta Lor  MD on 01/10/2010   Method used:   Print then Give to Patient   RxID:   [id number] Glory [place] ULTRA TEST   STRP (GLUCOSE BLOOD) use daily  #180 x 6   Entered and Authorized by:   Marletta Lor  MD   Signed by:   Marletta Lor  MD on 01/10/2010   Method used:   Print then Give to Patient   RxID:    [id number] BD INSULIN SYRINGE ULTRAFINE 31G X 5/16" 0.3 ML  MISC (INSULIN SYRINGE-NEEDLE U-100) use as directed  #180 x 6   Entered and Authorized by:   Marletta Lor  MD   Signed by:   Marletta Lor  MD on 01/10/2010   Method used:   Print then Give to Patient   RxID:   [id number] MELOXICAM 7.5 MG  TABS (MELOXICAM) one twice daily as needed  #90 x 2   Entered and Authorized by:   Marletta Lor  MD   Signed by:   Marletta Lor  MD on 01/10/2010   Method used:   Print then Give to Patient   RxID:   [id number] FREESTYLE LITE   STRP (GLUCOSE BLOOD)   #180 x 6   Entered and Authorized by:   Marletta Lor  MD   Signed by:   Marletta Lor  MD on 01/10/2010   Method used:   Print then Give to Patient   RxID:   [id number] BENAZEPRIL HCL 40 MG  TABS (BENAZEPRIL HCL) one daily  #90 Tablet x 4   Entered and Authorized by:   Marletta Lor  MD   Signed by:   Marletta Lor  MD on 01/10/2010   Method used:   Print then Give to Patient   [id number] FUROSEMIDE 40 MG  TABS (FUROSEMIDE) one daily  #90 x 6   Entered and Authorized by:   Marletta Lor  MD   Signed by:   Marletta Lor  MD on 01/10/2010   Method used:   Print then Give to Patient   RxID:   [id number] SIMVASTATIN 20 MG TABS (SIMVASTATIN) Take 1 tablet by mouth every night  #90 x 6   Entered and Authorized by:   Marletta Lor  MD   Signed by:   Marletta Lor  MD on 01/10/2010   Method used:   Print then Give to Patient   RxID:   [id number] GLYBURIDE 5 MG TABS (GLYBURIDE) 1 two times a day  #180 Tablet x 4   Entered and Authorized by:   Marletta Lor  MD   Signed by:   Marletta Lor  MD on 01/10/2010   Method used:   Print then Give to Patient   RxID:   [id number]  GLUCOPHAGE 1000 MG TABS (METFORMIN HCL) 1 two times a day  #180 Tablet x 4   Entered and Authorized by:   Marletta Lor  MD    Signed by:   Marletta Lor  MD on 01/10/2010   Method used:   Print then Give to Patient   RxID:   [id number] LORAZEPAM 0.5 MG TABS (LORAZEPAM) q6h as needed  #50 x 2   Entered and Authorized by:   Marletta Lor  MD   Signed by:   Marletta Lor  MD on 01/10/2010   Method used:   Print then Give to Patient   RxID:   [id number]   Laboratory Results   Urine Tests    Routine Urinalysis   Color: yellow Appearance: Clear Glucose: >=1000   (Normal Range: Negative) Bilirubin: small   (Normal Range: Negative) Ketone: small (15)   (Normal Range: Negative) Spec. Gravity: >=1.030   (Normal Range: 1.003-1.035) Blood: negative   (Normal Range: Negative) pH: 5.0   (Normal Range: 5.0-8.0) Protein: 100   (Normal Range: Negative) Urobilinogen: 0.2   (Normal Range: 0-1) Nitrite: negative   (Normal Range: Negative) Leukocyte Esterace: small   (Normal Range: Negative)

## 2011-01-07 NOTE — Assessment & Plan Note (Signed)
Summary: 3 month rov/njr   Vital Signs:  Patient profile:   70 year old female Weight:      194 pounds Temp:     98.7 degrees F oral BP sitting:   160 / 80  (right arm) Cuff size:   regular  Vitals Entered By: Cay Schillings LPN (November  3, 624THL 4:48 PM) CC: 3 mos rov - doing well - c/o urine very smelly     fbs 150 Is Patient Diabetic? Yes Did you bring your meter with you today? No   CC:  3 mos rov - doing well - c/o urine very smelly     fbs 150.  History of Present Illness: 70 year old patient who has a history of hypertension and type 2 diabetes.  She has a history also of dyslipidemia.  For the past several days.  She has had increasing frequency and darker urine.  She has also noted more odor to the urine.  Her diabetic status has been about the same.  Medical regimen includes oral therapy, as well as Byetta.  Denies any hypoglycemic symptoms  Allergies (verified): No Known Drug Allergies  Past History:  Past Medical History: Reviewed history from 12/24/2007 and no changes required. Diabetes mellitus, type II Hypertension Coronary artery disease Hyperlipidemia thyroid not dural Nephrolithiasis, hx of  Review of Systems  The patient denies anorexia, fever, weight loss, weight gain, vision loss, decreased hearing, hoarseness, chest pain, syncope, dyspnea on exertion, peripheral edema, prolonged cough, headaches, hemoptysis, abdominal pain, melena, hematochezia, severe indigestion/heartburn, hematuria, incontinence, genital sores, muscle weakness, suspicious skin lesions, transient blindness, difficulty walking, depression, unusual weight change, abnormal bleeding, enlarged lymph nodes, angioedema, and breast masses.    Physical Exam  General:  overweight-appearing.  140/78 Head:  Normocephalic and atraumatic without obvious abnormalities. No apparent alopecia or balding. Eyes:  No corneal or conjunctival inflammation noted. EOMI. Perrla. Funduscopic exam benign,  without hemorrhages, exudates or papilledema. Vision grossly normal. Mouth:  Oral mucosa and oropharynx without lesions or exudates.  Teeth in good repair. Neck:  No deformities, masses, or tenderness noted. Lungs:  Normal respiratory effort, chest expands symmetrically. Lungs are clear to auscultation, no crackles or wheezes. Heart:  Normal rate and regular rhythm. S1 and S2 normal without gallop, murmur, click, rub or other extra sounds. Abdomen:  Bowel sounds positive,abdomen soft and non-tender without masses, organomegaly or hernias noted. Msk:  No deformity or scoliosis noted of thoracic or lumbar spine.   Extremities:  No clubbing, cyanosis, edema, or deformity noted with normal full range of motion of all joints.     Impression & Recommendations:  Problem # 1:  UTI (ICD-599.0)  Her updated medication list for this problem includes:    Ciprofloxacin Hcl 500 Mg Tabs (Ciprofloxacin hcl) ..... One twice daily  Problem # 2:  HYPERLIPIDEMIA (ICD-272.4)  Her updated medication list for this problem includes:    Simvastatin 20 Mg Tabs (Simvastatin) .Marland Kitchen... Take 1 tablet by mouth every night  Problem # 3:  HYPERTENSION (ICD-401.9)  Her updated medication list for this problem includes:    Furosemide 40 Mg Tabs (Furosemide) ..... One daily    Benazepril Hcl 40 Mg Tabs (Benazepril hcl) ..... One daily    Amlodipine Besylate 5 Mg Tabs (Amlodipine besylate) ..... One daily  Problem # 4:  DIABETES MELLITUS, TYPE II (ICD-250.00)  Her updated medication list for this problem includes:    Bayer Aspirin 325 Mg Tabs (Aspirin) .Marland Kitchen... Take 1 tablet by mouth once a day  Glucophage 1000 Mg Tabs (Metformin hcl) .Marland Kitchen... 1 two times a day    Glyburide 5 Mg Tabs (Glyburide) .Marland Kitchen... 1 two times a day    Benazepril Hcl 40 Mg Tabs (Benazepril hcl) ..... One daily    Byetta 10 Mcg Pen 10 Mcg/0.78ml Soln (Exenatide) ..... Use twice daily will check a hemoglobin A1C next visit.  Unfortunately, it is after 5  o'clock  and  our lab has closed  Complete Medication List: 1)  Bayer Aspirin 325 Mg Tabs (Aspirin) .... Take 1 tablet by mouth once a day 2)  Glucophage 1000 Mg Tabs (Metformin hcl) .Marland Kitchen.. 1 two times a day 3)  Glyburide 5 Mg Tabs (Glyburide) .Marland Kitchen.. 1 two times a day 4)  Lorazepam 0.5 Mg Tabs (Lorazepam) .... Q6h as needed 5)  Simvastatin 20 Mg Tabs (Simvastatin) .... Take 1 tablet by mouth every night 6)  Furosemide 40 Mg Tabs (Furosemide) .... One daily 7)  Benazepril Hcl 40 Mg Tabs (Benazepril hcl) .... One daily 8)  Freestyle Lite Strp (Glucose blood) 9)  Meloxicam 7.5 Mg Tabs (Meloxicam) .... One twice daily as needed 10)  Hydrocodone-acetaminophen 5-500 Mg Tabs (Hydrocodone-acetaminophen) .... One every 6 hours for pain 11)  Bd Insulin Syringe Ultrafine 31g X 5/16" 0.3 Ml Misc (Insulin syringe-needle u-100) .... Use as directed 12)  Onetouch Ultra Test Strp (Glucose blood) .... Use daily 13)  Amlodipine Besylate 5 Mg Tabs (Amlodipine besylate) .... One daily 14)  Byetta 10 Mcg Pen 10 Mcg/0.59ml Soln (Exenatide) .... Use twice daily 15)  Ciprofloxacin Hcl 500 Mg Tabs (Ciprofloxacin hcl) .... One twice daily  Other Orders: UA Dipstick W/ Micro (manual) (81000) Tdap => 50yrs IM [id number]) Admin 1st Vaccine [id number])  Patient Instructions: 1)  Please schedule a follow-up appointment in 3 months. 2)  Limit your Sodium (Salt) to less than 2 grams a day(slightly less than 1/2 a teaspoon) to prevent fluid retention, swelling, or worsening of symptoms. 3)  It is important that you exercise regularly at least 20 minutes 5 times a week. If you develop chest pain, have severe difficulty breathing, or feel very tired , stop exercising immediately and seek medical attention. 4)  You need to lose weight. Consider a lower calorie diet and regular exercise.  5)  Check your blood sugars regularly. If your readings are usually above : or below 70 you should contact our office. 6)  It is important that your  Diabetic A1c level is checked every 3 months.   Orders Added: 1)  UA Dipstick W/ Micro (manual) [81000] 2)  Tdap => 63yrs IM [90715] 3)  Admin 1st Vaccine [90471] 4)  Est. Patient Level IV [id number]   Immunizations Administered:  Tetanus Vaccine:    Vaccine Type: Tdap    Site: left deltoid    Mfr: GlaxoSmithKline    Dose: 0.5 ml    Route: IM    Given by: Cay Schillings LPN    Exp. Date: 09/26/2012    Lot #: [id number]    VIS given: 10/25/08 version given October 10, 2010.    Physician counseled: yes   Immunizations Administered:  Tetanus Vaccine:    Vaccine Type: Tdap    Site: left deltoid    Mfr: GlaxoSmithKline    Dose: 0.5 ml    Route: IM    Given by: Cay Schillings LPN    Exp. Date: 09/26/2012    Lot #: [id number]    VIS given: 10/25/08 version given October 10, 2010.  Physician counseled: yes   Laboratory Results   Urine Tests  Date/Time Received: October 10, 2010 5:02 PM  Date/Time Reported: October 10, 2010 5:02 PM   Routine Urinalysis   Color: lt. yellow Appearance: Hazy Glucose: negative   (Normal Range: Negative) Bilirubin: negative   (Normal Range: Negative) Ketone: negative   (Normal Range: Negative) Spec. Gravity: 1.020   (Normal Range: 1.003-1.035) Blood: negative   (Normal Range: Negative) pH: 5.0   (Normal Range: 5.0-8.0) Protein: negative   (Normal Range: Negative) Urobilinogen: 0.2   (Normal Range: 0-1) Nitrite: negative   (Normal Range: Negative) Leukocyte Esterace: moderate   (Normal Range: Negative)

## 2011-01-07 NOTE — Procedures (Signed)
Summary: Colonoscopy  Patient: [name] Note: All result statuses are Final unless otherwise noted.  Tests: (1) Colonoscopy (COL)   COL Colonoscopy           [facility].     [place],   [address]           COLONOSCOPY PROCEDURE REPORT           PATIENT:  [name], [name]  MR#:  [id number]     BIRTHDATE:  Jan 21, 1941, 22 yrs. old  GENDER:  female     ENDOSCOPIST:  Docia Chuck. Geri Seminole, MD     REF. BY:  Bluford Kaufmann, M.D.     PROCEDURE DATE:  04/29/2010     PROCEDURE:  Colonoscopy with snare polypectomy x 2     ASA CLASS:  Class II     INDICATIONS:  Routine Risk Screening     MEDICATIONS:   Fentanyl 75 mcg IV, Versed 8 mg IV           DESCRIPTION OF PROCEDURE:   After the risks benefits and     alternatives of the procedure were thoroughly explained, informed     consent was obtained.  Digital rectal exam was performed and     revealed no abnormalities.   The LB [id number] endoscope     was introduced through the anus and advanced to the cecum, which     was identified by both the appendix and ileocecal valve, without     limitations.Time to cecum = 2:54 min. The quality of the prep was     excellent, using MoviPrep.  The instrument was then slowly     withdrawn (time = 9:10 min) as the colon was fully examined.     <<PROCEDUREIMAGES>>           FINDINGS:  Two 52mm polyps were found in the ascending colon.     Polyps were snared without cautery. Retrieval was successful.     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  This was otherwise a normal examination of the     colon.   Retroflexed views in the rectum revealed small internal     hemorrhoids.    The scope was then withdrawn from the patient and     the procedure completed.           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 Geri Seminole, MD           CC:  Marletta Lor, MD; The Patient           n.     eSIGNED:   Docia Chuck. Geri Seminole at 04/29/2010 11:04 AM           Arther Abbott, [id number]  Note: An exclamation mark (!) indicates a result that was not dispersed into the flowsheet. Document Creation Date: 04/29/2010 11:05  AM _______________________________________________________________________  (1) Order result status: Final Collection or observation date-time: 04/29/2010 10:55 Requested date-time:  Receipt date-time:  Reported date-time:  Referring Physician:   Ordering Physician: Lavena Bullion [phone]) Specimen Source:  Source: Tawanna Cooler Order Number: [id number] Lab site:   Appended Document: Colonoscopy recall     Procedures Next Due Date:    Colonoscopy: 04/2015

## 2011-01-07 NOTE — Letter (Signed)
Summary: Patient Notice- Polyp Results  [facility]  [address] [place], [place] [address]   Phone: [phone]  Fax: [phone]        Apr 30, 2010 MRN: [id number]    [facility] [address] [place], [place]  [address]    Dear [name],  I am pleased to inform you that the colon polyp(s) removed during your recent colonoscopy was (were) found to be benign (no cancer detected) upon pathologic examination.  I recommend you have a repeat colonoscopy examination in 5 years to look for recurrent polyps, as having colon polyps increases your risk for having recurrent polyps or even colon cancer in the future.  Should you develop new or worsening symptoms of abdominal pain, bowel habit changes or bleeding from the rectum or bowels, please schedule an evaluation with either your primary care physician or with me.  Additional information/recommendations:  __ No further action with gastroenterology is needed at this time. Please      follow-up with your primary care physician for your other healthcare      needs.   Please call us if you are having persistent problems or have questions about your condition that have not been fully answered at this time.  Sincerely,  Catherine Shipper MD  This letter has been electronically signed by your physician.  Appended Document: Patient Notice- Polyp Results letter mailed.

## 2011-01-09 ENCOUNTER — Encounter: Payer: Self-pay | Admitting: Internal Medicine

## 2011-01-09 ENCOUNTER — Ambulatory Visit (INDEPENDENT_AMBULATORY_CARE_PROVIDER_SITE_OTHER): Payer: Medicare Other | Admitting: Internal Medicine

## 2011-01-09 ENCOUNTER — Ambulatory Visit: Admit: 2011-01-09 | Payer: Self-pay | Admitting: Internal Medicine

## 2011-01-09 DIAGNOSIS — E785 Hyperlipidemia, unspecified: Secondary | ICD-10-CM

## 2011-01-09 DIAGNOSIS — I2510 Atherosclerotic heart disease of native coronary artery without angina pectoris: Secondary | ICD-10-CM

## 2011-01-09 DIAGNOSIS — E119 Type 2 diabetes mellitus without complications: Secondary | ICD-10-CM

## 2011-01-09 DIAGNOSIS — I10 Essential (primary) hypertension: Secondary | ICD-10-CM

## 2011-01-09 MED ORDER — AMLODIPINE BESYLATE 5 MG PO TABS: 5.0000 mg | ORAL_TABLET | Freq: Every day | ORAL | Status: DC

## 2011-01-09 MED ORDER — GLYBURIDE 5 MG PO TABS: 5.0000 mg | ORAL_TABLET | Freq: Two times a day (BID) | ORAL | Status: DC

## 2011-01-09 MED ORDER — LORAZEPAM 0.5 MG PO TABS: 0.5000 mg | ORAL_TABLET | Freq: Four times a day (QID) | ORAL | Status: DC | PRN

## 2011-01-09 MED ORDER — GLIMEPIRIDE 4 MG PO TABS: 4.0000 mg | ORAL_TABLET | ORAL | Status: DC

## 2011-01-09 MED ORDER — HYDROCODONE-ACETAMINOPHEN 5-500 MG PO TABS: 1.0000 | ORAL_TABLET | Freq: Four times a day (QID) | ORAL | Status: DC | PRN

## 2011-01-09 MED ORDER — MELOXICAM 7.5 MG PO TABS: 7.5000 mg | ORAL_TABLET | Freq: Two times a day (BID) | ORAL | Status: DC | PRN

## 2011-01-09 MED ORDER — "INSULIN SYRINGE-NEEDLE U-100 31G X 5/16"" 0.3 ML MISC": Status: DC

## 2011-01-09 MED ORDER — SIMVASTATIN 20 MG PO TABS: 20.0000 mg | ORAL_TABLET | Freq: Every day | ORAL | Status: DC

## 2011-01-09 MED ORDER — FUROSEMIDE 40 MG PO TABS: 40.0000 mg | ORAL_TABLET | Freq: Every day | ORAL | Status: DC

## 2011-01-09 MED ORDER — BENAZEPRIL HCL 40 MG PO TABS: 40.0000 mg | ORAL_TABLET | Freq: Every day | ORAL | Status: DC

## 2011-01-09 MED ORDER — EXENATIDE 10 MCG/0.04ML ~~LOC~~ SOPN: 10.0000 ug | PEN_INJECTOR | Freq: Two times a day (BID) | SUBCUTANEOUS | Status: DC

## 2011-01-09 MED ORDER — GLUCOSE BLOOD VI STRP: ORAL_STRIP | Status: DC

## 2011-01-09 MED ORDER — METFORMIN HCL 1000 MG PO TABS: 1000.0000 mg | ORAL_TABLET | Freq: Two times a day (BID) | ORAL | Status: DC

## 2011-01-09 NOTE — Assessment & Plan Note (Signed)
1.  Diabetes mellitus.  Will switch from Glyburide  to generic Amaryl; Will check a him go an A1c today.  Diet and weight loss encouraged

## 2011-01-09 NOTE — Progress Notes (Signed)
  Subjective:    Patient ID: [name], female    DOB: 1941-09-23, 70 y.o.   MRN: [id number]  HPI   70 -year-old patient who is seen today for follow-up.  She has a history of type 2 diabetes, which has been fairly stable.  Her last tingle and A1c7.3.  She has a history of exogenous obesity.  She has hypertension, which has been stable.  She has coronary artery disease and has had no ischemic symptoms.  She has dyslipidemia, controlled on simvastatin therapy.  Blood pressure medications include ACE inhibition, which he tolerates well.  Due to cost considerations.  She wishes to switch off glyburide and switched to generic Ameron   Review of Systems  Constitutional: Negative.   HENT: Negative for hearing loss, congestion, sore throat, rhinorrhea, dental problem, sinus pressure and tinnitus.   Eyes: Negative for pain, discharge and visual disturbance.  Respiratory: Negative for cough and shortness of breath.   Cardiovascular: Negative for chest pain, palpitations and leg swelling.  Gastrointestinal: Negative for nausea, vomiting, abdominal pain, diarrhea, constipation, blood in stool and abdominal distention.  Genitourinary: Negative for dysuria, urgency, frequency, hematuria, flank pain, vaginal bleeding, vaginal discharge, difficulty urinating, vaginal pain and pelvic pain.  Musculoskeletal: Negative for joint swelling, arthralgias and gait problem.  Skin: Negative for rash.  Neurological: Negative for dizziness, syncope, speech difficulty, weakness, numbness and headaches.  Hematological: Negative for adenopathy. Does not bruise/bleed easily.  Psychiatric/Behavioral: Negative for behavioral problems, dysphoric mood and agitation. The patient is not nervous/anxious.        Objective:   Physical Exam  Constitutional: She is oriented to person, place, and time. She appears well-developed and well-nourished.       obese  HENT:  Head: Normocephalic.  Right Ear: External ear normal.  Left  Ear: External ear normal.  Mouth/Throat: Oropharynx is clear and moist.  Eyes: Conjunctivae and EOM are normal. Pupils are equal, round, and reactive to light.  Neck: Normal range of motion. Neck supple. No thyromegaly present.  Cardiovascular: Normal rate, regular rhythm, normal heart sounds and intact distal pulses.   Pulmonary/Chest: Effort normal and breath sounds normal.  Abdominal: Soft. Bowel sounds are normal. She exhibits no mass. There is no tenderness.  Musculoskeletal: Normal range of motion.  Lymphadenopathy:    She has no cervical adenopathy.  Neurological: She is alert and oriented to person, place, and time.  Skin: Skin is warm and dry. No rash noted.  Psychiatric: She has a normal mood and affect. Her behavior is normal.          Assessment & Plan:

## 2011-01-09 NOTE — Patient Instructions (Signed)
Limit your sodium (Salt) intake   It is important that you exercise regularly, at least 20 minutes 3 to 4 times per week.  If you develop chest pain or shortness of breath seek  medical attention. You need to lose weight.  Consider a lower calorie diet and regular exercise.  Please check your hemoglobin A1c every 3 months  Smoking tobacco is very bad for your health. You should stop smoking immediately.  Return in 3 months for follow-up

## 2011-01-09 NOTE — Assessment & Plan Note (Signed)
3.  Coronary artery disease.  Clinically stable will continue aggressive risk factor modification.  Encouraged weight loss, diet, exercise, and total cessation of tobacco products

## 2011-01-09 NOTE — Assessment & Plan Note (Signed)
2. Hypertension stable.  Repeat blood pressure 140/60.  Will continue her present regimen.  Medications refill

## 2011-01-09 NOTE — Progress Notes (Signed)
Summary: new rx  Phone Note Refill Request Message from:  Patient  Refills Requested: Medication #1:  ONETOUCH ULTRA TEST   STRP use daily pt would like to test her glucose 4 twices a day please call [facility] [address]  Initial call taken by: Glo Herring,  November 25, 2010 11:04 AM  Follow-up for Phone Call        ok but two times a day max more appropriate Follow-up by: Marletta Lor  MD,  November 25, 2010 5:22 PM    OK  Appended Document: new rx    Clinical Lists Changes  Medications: Changed medication from [facility] ULTRA TEST   STRP (GLUCOSE BLOOD) use daily to [facility] ULTRA TEST   STRP (GLUCOSE BLOOD) qid - Signed Rx of ONETOUCH ULTRA TEST   STRP (GLUCOSE BLOOD) qid;  #360 x 3;  Signed;  Entered by: Cay Schillings LPN;  Authorized by: Marletta Lor  MD;  Method used: Electronically to [facility] [phone]*, [address], [place], [place]  [address], Ph: [phone], Fax: [phone]    Prescriptions: [facility] Siva TEST   STRP (GLUCOSE BLOOD) qid  #360 x 3   Entered by:   Cay Schillings LPN   Authorized by:   Marletta Lor  MD   Signed by:   Cay Schillings LPN on [id number]   Method used:   Electronically to        [facility] [phone]* (retail)       [address]       [place], [place]  [id number]       Ph: [phone]       Fax: [phone]   [id number]

## 2011-01-10 LAB — HEMOGLOBIN A1C: Hgb A1c MFr Bld: 7.5 % — ABNORMAL HIGH (ref 4.6–6.5)

## 2011-02-24 ENCOUNTER — Other Ambulatory Visit: Payer: Self-pay | Admitting: Internal Medicine

## 2011-02-24 DIAGNOSIS — Z1231 Encounter for screening mammogram for malignant neoplasm of breast: Secondary | ICD-10-CM

## 2011-04-11 ENCOUNTER — Ambulatory Visit: Payer: Medicare Other | Admitting: Internal Medicine

## 2011-04-14 ENCOUNTER — Ambulatory Visit (INDEPENDENT_AMBULATORY_CARE_PROVIDER_SITE_OTHER): Payer: Medicare Other | Admitting: Internal Medicine

## 2011-04-14 ENCOUNTER — Ambulatory Visit
Admission: RE | Admit: 2011-04-14 | Discharge: 2011-04-14 | Disposition: A | Discharge status: Home / Self Care | Payer: Medicare Other | Source: Ambulatory Visit | Attending: Internal Medicine | Admitting: Internal Medicine

## 2011-04-14 ENCOUNTER — Encounter: Payer: Self-pay | Admitting: Internal Medicine

## 2011-04-14 DIAGNOSIS — E119 Type 2 diabetes mellitus without complications: Secondary | ICD-10-CM

## 2011-04-14 DIAGNOSIS — I10 Essential (primary) hypertension: Secondary | ICD-10-CM

## 2011-04-14 DIAGNOSIS — Z1231 Encounter for screening mammogram for malignant neoplasm of breast: Secondary | ICD-10-CM

## 2011-04-14 DIAGNOSIS — E785 Hyperlipidemia, unspecified: Secondary | ICD-10-CM

## 2011-04-14 NOTE — Progress Notes (Signed)
  Subjective:    Patient ID: [name], female    DOB: 08/02/1941, 70 y.o.   MRN: [id number]  HPI Wt Readings from Last 3 Encounters:  04/14/11 184 lb (83.462 kg)  01/09/11 186 lb (84.369 kg)  10/10/10 194 lb (87.998 kg)    A 70 year old patient who is seen today for quarterly followup of her type 2 diabetes. Her last hemoglobin A1c 7.5. There has been some modest weight loss she remains on by 8 glimepiride and metformin. She describes rare hypoglycemia in the range of 55-65 which is only mildly symptomatic. She has had perhaps 2 episodes over the past 2 months She has coronary artery disease which has been stable. She has treated hypertension and remains on simvastatin for dyslipidemia. Denies any cardiopulmonary complaints she has osteoarthritis and chronic left hip pain this has been stable Review of Systems  Constitutional: Negative.   HENT: Negative for hearing loss, congestion, sore throat, rhinorrhea, dental problem, sinus pressure and tinnitus.   Eyes: Negative for pain, discharge and visual disturbance.  Respiratory: Negative for cough and shortness of breath.   Cardiovascular: Negative for chest pain, palpitations and leg swelling.  Gastrointestinal: Negative for nausea, vomiting, abdominal pain, diarrhea, constipation, blood in stool and abdominal distention.  Genitourinary: Negative for dysuria, urgency, frequency, hematuria, flank pain, vaginal bleeding, vaginal discharge, difficulty urinating, vaginal pain and pelvic pain.  Musculoskeletal: Negative for joint swelling, arthralgias and gait problem.  Skin: Negative for rash.  Neurological: Negative for dizziness, syncope, speech difficulty, weakness, numbness and headaches.  Hematological: Negative for adenopathy.  Psychiatric/Behavioral: Negative for behavioral problems, dysphoric mood and agitation. The patient is not nervous/anxious.        Objective:   Physical Exam  Constitutional: She is oriented to person, place,  and time. She appears well-developed and well-nourished.       Overweight no distress. Blood pressure 120/64  HENT:  Head: Normocephalic.  Right Ear: External ear normal.  Left Ear: External ear normal.  Mouth/Throat: Oropharynx is clear and moist.  Eyes: Conjunctivae and EOM are normal. Pupils are equal, round, and reactive to light.  Neck: Normal range of motion. Neck supple. No thyromegaly present.  Cardiovascular: Normal rate, regular rhythm, normal heart sounds and intact distal pulses.   Pulmonary/Chest: Effort normal and breath sounds normal.  Abdominal: Soft. Bowel sounds are normal. She exhibits no mass. There is no tenderness.  Musculoskeletal: Normal range of motion.  Lymphadenopathy:    She has no cervical adenopathy.  Neurological: She is alert and oriented to person, place, and time.  Skin: Skin is warm and dry. No rash noted.  Psychiatric: She has a normal mood and affect. Her behavior is normal.          Assessment & Plan:   Diabetes mellitus. Weight loss exercise all encouraged. There has been some modest weight loss over the past 6 months. We'll check a hemoglobin A1c  Hypertension stable we'll continue present regimen Coronary artery disease stable Dyslipidemia. Continues to tolerate the Zocor 20 mg daily

## 2011-04-14 NOTE — Patient Instructions (Signed)
It is important that you exercise regularly, at least 20 minutes 3 to 4 times per week.  If you develop chest pain or shortness of breath seek  medical attention.  You need to lose weight.  Consider a lower calorie diet and regular exercise.  Return in 3 months for follow-up

## 2011-04-15 LAB — HEMOGLOBIN A1C: Hgb A1c MFr Bld: 7.2 % — ABNORMAL HIGH (ref 4.6–6.5)

## 2011-04-25 NOTE — Discharge Summary (Signed)
[place]. [facility]  Patient:    [name], [name]                          MRN: [id number] Adm. Date:  [id number] Disc. Date: [id number] Attending:  [name] CC:         Marletta Lor, M.D.   Discharge Summary  ADMISSION DIAGNOSIS:  Acute coronary syndrome.  DISCHARGE DIAGNOSES: 1. Diffuse coronary artery disease with lesions ranging from 20% to 50%,    medical treatment recommended. 2. Hypertension controlled. 3. Diabetes, suboptimal control. 4. Granulomatous disease. 5. Thyroid nodule, incidental finding.  BRIEF HISTORY:  [name] is a 70 year old white female admitted with burning substernal chest pain lasting approximately 45 minutes.  Her symptoms included nausea, diaphoresis, and shortness of breath.  HOSPITAL COURSE:  She was admitted and placed on heparin.  She was seen in consultation by cardiology on April 17.  A catheterization was performed on April 18, which showed lesions which varied from 20 to 50%.  Ejection fraction was 70%.  Medical treatment was recommended as there was no high grade obstructive lesion.  Her LDL cholesterol was noted to be 139 above the goal of 130.  HDL was mildly reduced at 37 and total cholesterol was 193.  Zocor 20 mg at bedtime was initiated.  Serial CPKs revealed no injury.  Glucoses ranged from 140 to 170 roughly.  Hemoglobin A1C revealed suboptimal control with a hemoglobin A1C of 7.7; ideal goal would be at least less than 7 and preferably less than 6 which is nondiabetic.  Her renal function was normal.  Her initial hematocrit was 36.4 which was low normal; follow-up was 34.1 after IV fluids and multiple blood draws.  Her last hematocrit was on April 19, at 31.8.  She denied any rectal bleeding or melena.  During the hospitalization, her Glucovance was held because of the Metformin, because of the need for radiographic studies.  Her chest x-ray showed some vascular accentuation  and possibility of pulmonary venous hypertension.  A CT scan was performed following the catheterization to make sure there was no pulmonary emboli and this did not reveal such.  There were small nodules noted diffusely suggesting remote granulomatous disease. Follow-up CT limited to the bases was recommended in four to five months.  An incidental finding was a thyroid nodule.  This could not be palpated on examination.  Significantly she had quit smoking one month prior to admission.  She states this was related to her brothers upper airway cancer and her concerns of increased risk.  Her discharge status was improved.  On the day of discharge she denied any chest pain, was having improved respiratory function.  Chest was clear and she had a grade I systolic murmur.  Her pO2 on room air was 71 with a pCO2 of 34.   She was to be discharged on Protonix 40 mg daily, Lo-Trol 520 to be continued as at home, as well as Glucovance 3.5/500 twice a day with the largest meals.  New medications included enteric-coated aspirin 325 mg daily with a meal, Zocor 20 mg at bedtime.  Her hydrochlorothiazide was to be discontinued.  She will see Marletta Lor, M.D. in one to two weeks.  It was recommended that she follow the diets in [place] which is Weight [employer]chers 250 best recipes.  Dr. Burnice Logan can discuss with Ms. Durrani as to whether a nutritionalist should be consulted or whether she should  attend the [facility].  At the time of discharge, a glucometer was prescribed.  She was asked to check her fasting blood sugars and two-hour after the largest meal glucoses each day. The average fasting should be less than 120 and the average for the postmeal glucose should be less than 180.  She has addressed the most important risk factor which is smoking, weight loss to 150 or less would be recommended, along with a graduated exercise program. As stated the LDL or bad cholesterol was  only minimally elevated, but the protective goal would be less than 100.  The mildly reduced HDL can be addressed with exercise and the addition of cold water fish to her diet such as tuna or salmon.  CONDITION ON DISCHARGE:  Improved.  Her prognosis depends on addressing the risk factors to delay progression of the coronary artery disease or to even allow reversal.  The dictation was done in her presence to reinforce these recommendations.  Prescriptions were written for the glucometer with the specific strips as provided by the pharmacy.  Prescription was also written for Zocor 20 mg at bedtime dispense 30, Protonix 40 mg one each morning dispense 30, and Imdur 30 mg one daily dispense 30.  Dr. Burnice Logan will adjust these during her follow-up as an outpatient.  Her activities will be as tolerated. DD:  03/27/01 TD:  03/29/01 Job: [id number]

## 2011-04-25 NOTE — Assessment & Plan Note (Signed)
[facility] OFFICE NOTE   [name]                        MRN:          [id number]  DATE:01/01/2007                            DOB:          09-01-1941    A 70 year old female who is seen today for a physical.  She has not been  seen in approximately 16 months.  She has history of type 2 diabetes and  states blood sugars have been running high.  She has developed an upper  respiratory tract infection.  She was last hospital in 1999 for an acute  coronary syndrome.  She had moderate coronary artery disease documented  at that time.  She has hypertension, hyperlipidemia, has seen Dr.  Terance [name] in the past for a kidney stone.  She has had a prior  hysterectomy in 1981 for uterine cancer.  Has had a hemorrhoidectomy in  1983 and sinus surgery performed by Dr. Ernesto [name].   REVIEW OF SYSTEMS:  Negative.  No prior colon screening.  She is  scheduled for an eye exam today and states that she is planning a  mammogram.   SOCIAL HISTORY:  She is married.  Husband has been diagnosed with  esophageal cancer, is followed by oncology.   FAMILY HISTORY:  Father died at 41 of throat cancer and had COPD.  Mother is followed in this practice.  She is 65 with hypertension.  Four  brothers, six sisters.  Two brothers deceased, one from throat cancer,  another a suicide death.  One sister died of uterine cancer.  One sister  has a history of breast cancer.   PHYSICAL EXAMINATION:  GENERAL:  Revealed an overweight white female in  no acute distress.  VITAL SIGNS:  Blood pressure is 140/80, weight 187, temperature  afebrile.  SKIN:  Unremarkable.  HEENT:  Fundi, ear, nose and throat unremarkable.  NECK:  No bruits.  CHEST:  Revealed rhonchi and some faint expiratory wheezing.  BREAST:  Normal.  CARDIOVASCULAR:  Normal heart sounds, no murmurs.  ABDOMEN:  Obese, soft and nontender.  No organomegaly.  PELVIC:  Revealed  absent uterus, no adnexal masses, stool heme negative.  EXTREMITIES:  Reveal intact peripheral pulses.  NEUROLOGIC:  Negative, intact to monofilament testing.   IMPRESSION:  1. Upper respiratory infection.  2. Diabetes, poor control.  3. Coronary artery disease.  4. Hypertension.  5. Dyslipidemia.   DISPOSITION:  Laboratory studies including a hemoglobin A1c will be  reviewed.  Januvia will be added to her regimen today.  Once she is  stable, will set her up for a colonoscopy and also a Cardiolite stress  test.     Catherine Lor, MD  Electronically Signed    PFK/MedQ  DD: 01/01/2007  DT: 01/01/2007  Job #: [id number]

## 2011-04-25 NOTE — H&P (Signed)
[place]. [facility]  Patient:    [name], [name]                          MRN: [id number] Adm. Date:  03/24/01 Attending:  Marletta Lor, M.D.                         History and Physical  CHIEF COMPLAINT:  Chest pain.  HISTORY OF PRESENT ILLNESS:  The patient is a 70 year old white female with a history of hypertension and diabetes and prior tobacco use, who was stable until the evening of admission when she presented to the ED with a 45-minute episode of burning chest pain.  This was associated with significant left arm discomfort, nausea, diaphoresis and possibly some mild shortness of breath. Patient was evaluated in the emergency room department where EKG was normal. Patient is now admitted to exclude acute coronary insufficiency.  PAST MEDICAL HISTORY:  The patient has a prior history of a heart catheterization 7 to 10 years ago; this was performed as an outpatient and the patient is not aware of any abnormalities at that time.  She has long-standing hypertension and diabetes and discontinued tobacco use approximately four weeks ago.  She states that about 1 month ago also she had a similar episode of burning chest pain that lasted about 40 minutes.  She has had a remote hysterectomy and a remote hemorrhoidectomy.  MEDICATIONS:  Her present medical regimen includes Lotrel, unclear dose once daily; Glucovance, unclear dose b.i.d.; hydrochlorothiazide.  ALLERGIES:  She has no known allergies.  REVIEW OF SYSTEMS:  Review of system exam is otherwise fairly noncontributory. She denies any known hyperlipidemia nor any significant family history of coronary artery disease.  FAMILY HISTORY:  Patients mother is alive at 2 and in reasonably good health.  Father died of throat cancer.  Three brothers and six sisters have no coronary artery disease.  PHYSICAL EXAMINATION:  VITAL SIGNS:  Blood pressure was 160/74, pulse 80.  GENERAL:  General exam  revealed a well-developed, healthy-appearing, overweight white female, pain-free, in no acute distress.  SKIN:  Unremarkable.  HEENT:  Exam revealed normal pupillary responses, conjunctivae clear, ENT unremarkable.  NECK:  Neck revealed bilateral carotid bruits, the left louder than the right.  CHEST:  Chest was clear.  CARDIOVASCULAR:  Exam revealed normal S1 and S2.  There was a grade 2/6 systolic murmur heard rather diffusely.  BREASTS:  Negative.  ABDOMEN:  Abdomen soft, nontender.  No organomegaly.  No masses or bruits appreciate.  GU:  External genitalia normal.  EXTREMITIES:  Extremities revealed no edema, peripheral pulses intact.  NEUROLOGIC:  Negative.  IMPRESSION: 1. Rule out acute coronary syndrome. 2. Hypertension. 3. Diabetes. 4. Tobacco use. 5. History of a negative heart catheterization 7 to 10 years ago.  DISPOSITION:  Will admit the patient to the hospital to a telemetry setting. Serial cardiac enzymes and EKGs to be reviewed.  Cardiology will evaluate. DD:  03/24/01 TD:  03/24/01 Job: [id number]

## 2011-04-25 NOTE — Cardiovascular Report (Signed)
[place]. [facility]  Patient:    [name], [name]                          MRN: [id number] Proc. Date: 03/25/01 Adm. Date:  [id number] Disc. Date: [id number] Attending:  [name]                        Cardiac Catheterization  REFERRING PHYSICIAN:  Marletta Lor, M.D.  CARDIOLOGIST:  Terald Sleeper, M.D.  PROCEDURE PERFORMED:  Left heart catheterization and ventriculography.  INTERVENTION:  None was performed.  DIAGNOSES: 1. Single-vessel coronary artery disease. 2. Normal left ventricular systolic function, elevated left ventricular    end-diastolic pressure.  LOCATION:  Access was obtained from the right femoral artery.  The JL4 and JR4 catheters were used.  COMPLICATIONS:  None.  HEMODYNAMICS:  Left ventricular pressure 118/25 mmHg, aortic pressure 118/62 mmHg.  VENTRICULOGRAPHY:  Performed from single plane area.  Projected ejection fraction was 70% with no segmental wall motion abnormalities.  SELECTIVE CORONARY ANGIOGRAPHY: 1. Left main coronary was free of flow-limiting disease. 2. Left anterior descending artery had proximal diffuse 20% stenosis involving    the first diagonal branch with a 40% ostial stenosis. 3. Left circumflex coronary artery was a large caliber vessel with no evidence    of flow-limiting coronary artery disease. 4. The right coronary artery has an ostial diffuse proximal 50% stenosis.    There was damping of the pressure waves when engaging the JL4 catheter.    Normal dye efflux was observed.  There was further vessel diameter    increased with intracoronary nitroglycerin.  The remainder of the    right coronary artery at the level of the mid vessel with 30-40%    diffuse stenosis at the level of an RV branch.  CONCLUSIONS AND RECOMMENDATIONS:  The patient appears to have single-vessel coronary artery disease.  Start medical therapy as indicated.  Aspirin, beta blocker, and ACE inhibitor can be  continued.  A statin should be started lipid ______ check in six weeks.  PLAN:  The plan is to discharge the patient in the morning. DD:  05/09/01 TD:  05/10/01 Job: [id number]

## 2011-07-09 ENCOUNTER — Encounter: Payer: Self-pay | Admitting: Internal Medicine

## 2011-07-09 ENCOUNTER — Ambulatory Visit (INDEPENDENT_AMBULATORY_CARE_PROVIDER_SITE_OTHER): Payer: Medicare Other | Admitting: Internal Medicine

## 2011-07-09 DIAGNOSIS — E119 Type 2 diabetes mellitus without complications: Secondary | ICD-10-CM

## 2011-07-09 DIAGNOSIS — E785 Hyperlipidemia, unspecified: Secondary | ICD-10-CM

## 2011-07-09 DIAGNOSIS — I10 Essential (primary) hypertension: Secondary | ICD-10-CM

## 2011-07-09 DIAGNOSIS — I2510 Atherosclerotic heart disease of native coronary artery without angina pectoris: Secondary | ICD-10-CM

## 2011-07-09 LAB — HEMOGLOBIN A1C: Hgb A1c MFr Bld: 7.5 % — ABNORMAL HIGH (ref 4.6–6.5)

## 2011-07-09 MED ORDER — GLIMEPIRIDE 4 MG PO TABS: 4.0000 mg | ORAL_TABLET | ORAL | Status: DC

## 2011-07-09 MED ORDER — SIMVASTATIN 20 MG PO TABS: 20.0000 mg | ORAL_TABLET | Freq: Every day | ORAL | Status: DC

## 2011-07-09 MED ORDER — HYDROCODONE-ACETAMINOPHEN 5-500 MG PO TABS: 1.0000 | ORAL_TABLET | Freq: Four times a day (QID) | ORAL | Status: DC | PRN

## 2011-07-09 MED ORDER — BENAZEPRIL HCL 40 MG PO TABS: 40.0000 mg | ORAL_TABLET | Freq: Every day | ORAL | Status: DC

## 2011-07-09 MED ORDER — FUROSEMIDE 40 MG PO TABS: 40.0000 mg | ORAL_TABLET | Freq: Every day | ORAL | Status: DC

## 2011-07-09 MED ORDER — GLUCOSE BLOOD VI STRP: ORAL_STRIP | Status: DC

## 2011-07-09 MED ORDER — MELOXICAM 7.5 MG PO TABS: 7.5000 mg | ORAL_TABLET | Freq: Two times a day (BID) | ORAL | Status: DC | PRN

## 2011-07-09 MED ORDER — EXENATIDE 10 MCG/0.04ML ~~LOC~~ SOPN: 10.0000 ug | PEN_INJECTOR | Freq: Two times a day (BID) | SUBCUTANEOUS | Status: DC

## 2011-07-09 MED ORDER — AMLODIPINE BESYLATE 5 MG PO TABS: 5.0000 mg | ORAL_TABLET | Freq: Every day | ORAL | Status: DC

## 2011-07-09 MED ORDER — LORAZEPAM 0.5 MG PO TABS: 0.5000 mg | ORAL_TABLET | Freq: Four times a day (QID) | ORAL | Status: DC | PRN

## 2011-07-09 MED ORDER — METFORMIN HCL 1000 MG PO TABS: 1000.0000 mg | ORAL_TABLET | Freq: Two times a day (BID) | ORAL | Status: DC

## 2011-07-09 NOTE — Progress Notes (Signed)
  Subjective:    Patient ID: [name], female    DOB: 1941-10-28, 70 y.o.   MRN: [id number]  HPI   Wt Readings from Last 3 Encounters:  07/09/11 184 lb (83.462 kg)  04/14/11 184 lb (83.462 kg)  01/09/11 186 lb (84.369 kg)     Review of Systems     Objective:   Physical Exam        Assessment & Plan:

## 2011-07-09 NOTE — Patient Instructions (Signed)
Please check your hemoglobin A1c every 3 months    It is important that you exercise regularly, at least 20 minutes 3 to 4 times per week.  If you develop chest pain or shortness of breath seek  medical attention.  You need to lose weight.  Consider a lower calorie diet and regular exercise. 

## 2011-07-09 NOTE — Progress Notes (Signed)
  Subjective:    Patient ID: [name], female    DOB: 1941-08-04, 70 y.o.   MRN: [id number]  HPI  70 year old patient who is in today for followup of her type 2 diabetes. There has been no weight loss. Glycemic control has been fair. Her last hemoglobin A1c 7.3. She has coronary artery disease which has been stable. She denies any exertional chest pain or shortness of breath She has treated hypertension which has been controlled on ACE inhibition and diuretic therapy. She remains on simvastatin for dyslipidemia. She continues to tolerate this medication well. She is requesting refills on all her medications including her hydrocodone    Review of Systems  Constitutional: Negative.   HENT: Negative for hearing loss, congestion, sore throat, rhinorrhea, dental problem, sinus pressure and tinnitus.   Eyes: Negative for pain, discharge and visual disturbance.  Respiratory: Negative for cough and shortness of breath.   Cardiovascular: Negative for chest pain, palpitations and leg swelling.  Gastrointestinal: Negative for nausea, vomiting, abdominal pain, diarrhea, constipation, blood in stool and abdominal distention.  Genitourinary: Negative for dysuria, urgency, frequency, hematuria, flank pain, vaginal bleeding, vaginal discharge, difficulty urinating, vaginal pain and pelvic pain.  Musculoskeletal: Negative for joint swelling, arthralgias and gait problem.  Skin: Negative for rash.  Neurological: Negative for dizziness, syncope, speech difficulty, weakness, numbness and headaches.  Hematological: Negative for adenopathy.  Psychiatric/Behavioral: Negative for behavioral problems, dysphoric mood and agitation. The patient is not nervous/anxious.        Objective:   Physical Exam  Constitutional: She is oriented to person, place, and time. She appears well-developed and well-nourished.  HENT:  Head: Normocephalic.  Right Ear: External ear normal.  Left Ear: External ear normal.    Mouth/Throat: Oropharynx is clear and moist.  Eyes: Conjunctivae and EOM are normal. Pupils are equal, round, and reactive to light.  Neck: Normal range of motion. Neck supple. No thyromegaly present.  Cardiovascular: Normal rate, regular rhythm, normal heart sounds and intact distal pulses.   Pulmonary/Chest: Effort normal and breath sounds normal.  Abdominal: Soft. Bowel sounds are normal. She exhibits no mass. There is no tenderness.  Musculoskeletal: Normal range of motion.  Lymphadenopathy:    She has no cervical adenopathy.  Neurological: She is alert and oriented to person, place, and time.  Skin: Skin is warm and dry. No rash noted.  Psychiatric: She has a normal mood and affect. Her behavior is normal.          Assessment & Plan:    Diabetes mellitus. We'll check a hemoglobin A1c weight loss or exercise encouraged Hypertension well controlled Dyslipidemia Coronary artery disease stable

## 2011-07-14 ENCOUNTER — Ambulatory Visit: Payer: Medicare Other | Admitting: Internal Medicine

## 2011-09-17 LAB — HM DIABETES EYE EXAM

## 2011-09-19 ENCOUNTER — Encounter: Payer: Self-pay | Admitting: Internal Medicine

## 2011-10-27 ENCOUNTER — Other Ambulatory Visit: Payer: Self-pay | Admitting: Internal Medicine

## 2011-10-27 NOTE — Telephone Encounter (Signed)
Ok if available

## 2011-10-27 NOTE — Telephone Encounter (Signed)
Pt need byetta insulin pens samples.

## 2011-10-28 NOTE — Telephone Encounter (Signed)
Pt aware samples in fridge for pick up

## 2011-10-31 ENCOUNTER — Ambulatory Visit (INDEPENDENT_AMBULATORY_CARE_PROVIDER_SITE_OTHER): Payer: Medicare Other | Admitting: Internal Medicine

## 2011-10-31 ENCOUNTER — Encounter: Payer: Self-pay | Admitting: Internal Medicine

## 2011-10-31 DIAGNOSIS — H9202 Otalgia, left ear: Secondary | ICD-10-CM

## 2011-10-31 DIAGNOSIS — I10 Essential (primary) hypertension: Secondary | ICD-10-CM

## 2011-10-31 DIAGNOSIS — I2510 Atherosclerotic heart disease of native coronary artery without angina pectoris: Secondary | ICD-10-CM

## 2011-10-31 DIAGNOSIS — E119 Type 2 diabetes mellitus without complications: Secondary | ICD-10-CM

## 2011-10-31 DIAGNOSIS — H9209 Otalgia, unspecified ear: Secondary | ICD-10-CM

## 2011-10-31 DIAGNOSIS — E785 Hyperlipidemia, unspecified: Secondary | ICD-10-CM

## 2011-10-31 MED ORDER — MELOXICAM 7.5 MG PO TABS: 7.5000 mg | ORAL_TABLET | Freq: Two times a day (BID) | ORAL | Status: DC | PRN

## 2011-10-31 MED ORDER — EXENATIDE 10 MCG/0.04ML ~~LOC~~ SOPN: 10.0000 ug | PEN_INJECTOR | Freq: Two times a day (BID) | SUBCUTANEOUS | Status: DC

## 2011-10-31 MED ORDER — LORAZEPAM 0.5 MG PO TABS: 0.5000 mg | ORAL_TABLET | Freq: Four times a day (QID) | ORAL | Status: DC | PRN

## 2011-10-31 MED ORDER — BENAZEPRIL HCL 40 MG PO TABS: 40.0000 mg | ORAL_TABLET | Freq: Every day | ORAL | Status: DC

## 2011-10-31 MED ORDER — GLUCOSE BLOOD VI STRP: ORAL_STRIP | Status: DC

## 2011-10-31 MED ORDER — HYDROCODONE-ACETAMINOPHEN 5-500 MG PO TABS: 1.0000 | ORAL_TABLET | Freq: Four times a day (QID) | ORAL | Status: DC | PRN

## 2011-10-31 MED ORDER — AMLODIPINE BESYLATE 5 MG PO TABS: 5.0000 mg | ORAL_TABLET | Freq: Every day | ORAL | Status: DC

## 2011-10-31 MED ORDER — SIMVASTATIN 20 MG PO TABS: 20.0000 mg | ORAL_TABLET | Freq: Every day | ORAL | Status: DC

## 2011-10-31 MED ORDER — METFORMIN HCL 1000 MG PO TABS
1000.0000 mg | ORAL_TABLET | Freq: Two times a day (BID) | ORAL | Status: DC
Start: 2011-10-31 — End: 2012-01-05

## 2011-10-31 MED ORDER — FUROSEMIDE 40 MG PO TABS: 40.0000 mg | ORAL_TABLET | Freq: Every day | ORAL | Status: DC

## 2011-10-31 MED ORDER — GLIMEPIRIDE 4 MG PO TABS: 4.0000 mg | ORAL_TABLET | ORAL | Status: DC

## 2011-10-31 NOTE — Progress Notes (Signed)
  Subjective:    Patient ID: [name], female    DOB: 1941-05-31, 70 y.o.   MRN: [id number]  HPI  70 year old patient who presents today with a chief complaint of left ear discomfort. She describes a mild to moderate aching sensation about the left ear. There has been no hearing loss tinnitus or drainage from the canal. She has a history of diabetes. Her last hemoglobin A1c is 7.5. She has coronary artery disease ongoing tobacco use and exogenous obesity. She states her glycemic control has done better Denies any chest pain Requires medication refills Since her last visit here she has had an eye exam and cataract extraction surgery. She has had a flu vaccine this year   Review of Systems  Constitutional: Negative.   HENT: Positive for ear pain. Negative for hearing loss, congestion, sore throat, rhinorrhea, dental problem, sinus pressure and tinnitus.   Eyes: Negative for pain, discharge and visual disturbance.  Respiratory: Negative for cough and shortness of breath.   Cardiovascular: Negative for chest pain, palpitations and leg swelling.  Gastrointestinal: Negative for nausea, vomiting, abdominal pain, diarrhea, constipation, blood in stool and abdominal distention.  Genitourinary: Negative for dysuria, urgency, frequency, hematuria, flank pain, vaginal bleeding, vaginal discharge, difficulty urinating, vaginal pain and pelvic pain.  Musculoskeletal: Negative for joint swelling, arthralgias and gait problem.  Skin: Negative for rash.  Neurological: Negative for dizziness, syncope, speech difficulty, weakness, numbness and headaches.  Hematological: Negative for adenopathy.  Psychiatric/Behavioral: Negative for behavioral problems, dysphoric mood and agitation. The patient is not nervous/anxious.        Objective:   Physical Exam  Constitutional: She is oriented to person, place, and time. She appears well-developed and well-nourished.       Overweight. No distress. Blood pressure  normal  HENT:  Head: Normocephalic.  Right Ear: External ear normal.  Left Ear: External ear normal.  Mouth/Throat: Oropharynx is clear and moist.       Both canals and tympanic membranes were normal  Eyes: Conjunctivae and EOM are normal. Pupils are equal, round, and reactive to light.  Neck: Normal range of motion. Neck supple. No thyromegaly present.  Cardiovascular: Normal rate, regular rhythm, normal heart sounds and intact distal pulses.   Pulmonary/Chest: Effort normal and breath sounds normal.  Abdominal: Soft. Bowel sounds are normal. She exhibits no mass. There is no tenderness.  Musculoskeletal: Normal range of motion.  Lymphadenopathy:    She has no cervical adenopathy.  Neurological: She is alert and oriented to person, place, and time.  Skin: Skin is warm and dry. No rash noted.  Psychiatric: She has a normal mood and affect. Her behavior is normal.          Assessment & Plan:   Left ear pain. Unclear etiology. Will observe at this point the patient does have anti-inflammatories and analgesics pain is minor Diabetes mellitus. We'll check a hemoglobin A1c lifestyle issues discussed and stressed Coronary artery disease stable

## 2011-10-31 NOTE — Patient Instructions (Signed)
Limit your sodium (Salt) intake   Please check your hemoglobin A1c every 3 months  You need to lose weight.  Consider a lower calorie diet and regular exercise.    It is important that you exercise regularly, at least 20 minutes 3 to 4 times per week.  If you develop chest pain or shortness of breath seek  medical attention. 

## 2011-11-07 ENCOUNTER — Ambulatory Visit: Payer: Medicare Other | Admitting: Internal Medicine

## 2011-12-04 ENCOUNTER — Ambulatory Visit (INDEPENDENT_AMBULATORY_CARE_PROVIDER_SITE_OTHER): Payer: Medicare Other | Admitting: Internal Medicine

## 2011-12-04 ENCOUNTER — Encounter: Payer: Self-pay | Admitting: Internal Medicine

## 2011-12-04 DIAGNOSIS — J069 Acute upper respiratory infection, unspecified: Secondary | ICD-10-CM

## 2011-12-04 DIAGNOSIS — E119 Type 2 diabetes mellitus without complications: Secondary | ICD-10-CM

## 2011-12-04 DIAGNOSIS — I10 Essential (primary) hypertension: Secondary | ICD-10-CM

## 2011-12-04 MED ORDER — HYDROCODONE-HOMATROPINE 5-1.5 MG/5ML PO SYRP: 5.0000 mL | ORAL_SOLUTION | Freq: Four times a day (QID) | ORAL | Status: AC | PRN

## 2011-12-04 NOTE — Patient Instructions (Signed)
Get plenty of rest, Drink lots of  clear liquids, and use Tylenol or ibuprofen for fever and discomfort.    Limit your sodium (Salt) intake   Please check your hemoglobin A1c every 3 months    It is important that you exercise regularly, at least 20 minutes 3 to 4 times per week.  If you develop chest pain or shortness of breath seek  medical attention.

## 2011-12-04 NOTE — Progress Notes (Signed)
  Subjective:    Patient ID: [name], female    DOB: 28-Sep-1941, 70 y.o.   MRN: [id number]  HPI  70 year old patient who has a history of hypertension diabetes coronary artery disease who presents with a one-week history of head and chest congestion and cough cough is nonproductive. She has had intermittent low-grade fever denies any chest pain shortness of breath or wheezing. She has been using OTC medications with little benefit. Fasting blood sugar today 150. Her last hemoglobin A1c one month ago was slightly elevated at 7.2    Review of Systems  Constitutional: Positive for fever and fatigue.  HENT: Negative for hearing loss, congestion, sore throat, rhinorrhea, dental problem, sinus pressure and tinnitus.   Eyes: Negative for pain, discharge and visual disturbance.  Respiratory: Positive for cough (nonproductive). Negative for shortness of breath.   Cardiovascular: Negative for chest pain, palpitations and leg swelling.  Gastrointestinal: Negative for nausea, vomiting, abdominal pain, diarrhea, constipation, blood in stool and abdominal distention.  Genitourinary: Negative for dysuria, urgency, frequency, hematuria, flank pain, vaginal bleeding, vaginal discharge, difficulty urinating, vaginal pain and pelvic pain.  Musculoskeletal: Negative for joint swelling, arthralgias and gait problem.  Skin: Negative for rash.  Neurological: Negative for dizziness, syncope, speech difficulty, weakness, numbness and headaches.  Hematological: Negative for adenopathy.  Psychiatric/Behavioral: Negative for behavioral problems, dysphoric mood and agitation. The patient is not nervous/anxious.        Objective:   Physical Exam  Constitutional: She is oriented to person, place, and time. She appears well-developed and well-nourished.  HENT:  Head: Normocephalic.  Right Ear: External ear normal.  Left Ear: External ear normal.  Mouth/Throat: Oropharynx is clear and moist.  Eyes: Conjunctivae  and EOM are normal. Pupils are equal, round, and reactive to light.  Neck: Normal range of motion. Neck supple. No thyromegaly present.  Cardiovascular: Normal rate, regular rhythm, normal heart sounds and intact distal pulses.   Pulmonary/Chest: Effort normal and breath sounds normal.  Abdominal: Soft. Bowel sounds are normal. She exhibits no mass. There is no tenderness.  Musculoskeletal: Normal range of motion.  Lymphadenopathy:    She has no cervical adenopathy.  Neurological: She is alert and oriented to person, place, and time.  Skin: Skin is warm and dry. No rash noted.  Psychiatric: She has a normal mood and affect. Her behavior is normal.          Assessment & Plan:   Viral URI. Will treat symptomatically Diabetes mellitus. Last hemoglobin A1c 7.2 diet exercise weight loss all encouraged. Hypertension stable

## 2011-12-09 HISTORY — PX: PORTA CATH INSERTION: CATH118285

## 2012-01-05 ENCOUNTER — Telehealth: Payer: Self-pay | Admitting: Internal Medicine

## 2012-01-05 DIAGNOSIS — I2510 Atherosclerotic heart disease of native coronary artery without angina pectoris: Secondary | ICD-10-CM

## 2012-01-05 DIAGNOSIS — E119 Type 2 diabetes mellitus without complications: Secondary | ICD-10-CM

## 2012-01-05 DIAGNOSIS — E785 Hyperlipidemia, unspecified: Secondary | ICD-10-CM

## 2012-01-05 DIAGNOSIS — I10 Essential (primary) hypertension: Secondary | ICD-10-CM

## 2012-01-05 MED ORDER — MELOXICAM 7.5 MG PO TABS: 7.5000 mg | ORAL_TABLET | Freq: Two times a day (BID) | ORAL | Status: DC | PRN

## 2012-01-05 MED ORDER — FUROSEMIDE 40 MG PO TABS: 40.0000 mg | ORAL_TABLET | Freq: Every day | ORAL | Status: DC

## 2012-01-05 MED ORDER — AMLODIPINE BESYLATE 5 MG PO TABS: 5.0000 mg | ORAL_TABLET | Freq: Every day | ORAL | Status: DC

## 2012-01-05 MED ORDER — EXENATIDE 10 MCG/0.04ML ~~LOC~~ SOPN: 10.0000 ug | PEN_INJECTOR | Freq: Two times a day (BID) | SUBCUTANEOUS | Status: DC

## 2012-01-05 MED ORDER — GLIMEPIRIDE 4 MG PO TABS: 4.0000 mg | ORAL_TABLET | ORAL | Status: DC

## 2012-01-05 MED ORDER — BENAZEPRIL HCL 40 MG PO TABS: 40.0000 mg | ORAL_TABLET | Freq: Every day | ORAL | Status: DC

## 2012-01-05 MED ORDER — METFORMIN HCL 1000 MG PO TABS: 1000.0000 mg | ORAL_TABLET | Freq: Two times a day (BID) | ORAL | Status: DC

## 2012-01-05 MED ORDER — SIMVASTATIN 20 MG PO TABS: 20.0000 mg | ORAL_TABLET | Freq: Every day | ORAL | Status: DC

## 2012-01-05 NOTE — Telephone Encounter (Signed)
Rx sent to [facility] electronically.

## 2012-01-05 NOTE — Telephone Encounter (Signed)
Pt said that the exenatide (BYETTA 10 MCG PEN) 10 MCG/0.04ML SOLN and all other meds need to be faxed over to [facility] today. Pt is almost out of meds. Need 3 month supply on all meds.

## 2012-01-14 ENCOUNTER — Other Ambulatory Visit: Payer: Self-pay

## 2012-01-14 DIAGNOSIS — E785 Hyperlipidemia, unspecified: Secondary | ICD-10-CM

## 2012-01-14 MED ORDER — EXENATIDE 10 MCG/0.04ML ~~LOC~~ SOPN: 10.0000 ug | PEN_INJECTOR | Freq: Two times a day (BID) | SUBCUTANEOUS | Status: DC

## 2012-02-02 ENCOUNTER — Ambulatory Visit: Payer: Medicare Other | Admitting: Internal Medicine

## 2012-02-02 ENCOUNTER — Other Ambulatory Visit: Payer: Self-pay

## 2012-02-02 DIAGNOSIS — I10 Essential (primary) hypertension: Secondary | ICD-10-CM

## 2012-02-02 DIAGNOSIS — I2510 Atherosclerotic heart disease of native coronary artery without angina pectoris: Secondary | ICD-10-CM

## 2012-02-02 DIAGNOSIS — E119 Type 2 diabetes mellitus without complications: Secondary | ICD-10-CM

## 2012-02-02 MED ORDER — AMLODIPINE BESYLATE 5 MG PO TABS: 5.0000 mg | ORAL_TABLET | Freq: Every day | ORAL | Status: DC

## 2012-02-02 MED ORDER — METFORMIN HCL 1000 MG PO TABS: 1000.0000 mg | ORAL_TABLET | Freq: Two times a day (BID) | ORAL | Status: DC

## 2012-02-02 MED ORDER — MELOXICAM 7.5 MG PO TABS: 7.5000 mg | ORAL_TABLET | Freq: Two times a day (BID) | ORAL | Status: DC | PRN

## 2012-02-02 MED ORDER — BENAZEPRIL HCL 40 MG PO TABS: 40.0000 mg | ORAL_TABLET | Freq: Every day | ORAL | Status: DC

## 2012-03-01 ENCOUNTER — Encounter: Payer: Self-pay | Admitting: Internal Medicine

## 2012-03-01 ENCOUNTER — Ambulatory Visit (INDEPENDENT_AMBULATORY_CARE_PROVIDER_SITE_OTHER): Payer: Medicare Other | Admitting: Internal Medicine

## 2012-03-01 VITALS — BP 128/70 | Temp 98.2°F | Wt 182.0 lb

## 2012-03-01 DIAGNOSIS — I10 Essential (primary) hypertension: Secondary | ICD-10-CM

## 2012-03-01 DIAGNOSIS — L570 Actinic keratosis: Secondary | ICD-10-CM

## 2012-03-01 DIAGNOSIS — E119 Type 2 diabetes mellitus without complications: Secondary | ICD-10-CM

## 2012-03-01 DIAGNOSIS — I2510 Atherosclerotic heart disease of native coronary artery without angina pectoris: Secondary | ICD-10-CM

## 2012-03-01 NOTE — Progress Notes (Signed)
  Subjective:    Patient ID: [name], female    DOB: 1941-11-26, 71 y.o.   MRN: [id number]  HPI 71 year old patient has a history of type 2 diabetes. Medical regimen includes byetta. She's only been on 10 units twice daily for a short period of time. She is maintained nice glycemic control and wishes to wait 3 months to check a hemoglobin A1c due to her recent dose change. She has coronary artery disease which has been stable as well as treated hypertension. She is on Zocor for dyslipidemia. Denies any cardiopulmonary complaints She does have 2 skin lesions one above the left elbow and the other about the right knee that she wishes to have removed.    Review of Systems  Constitutional: Negative.   HENT: Negative for hearing loss, congestion, sore throat, rhinorrhea, dental problem, sinus pressure and tinnitus.   Eyes: Negative for pain, discharge and visual disturbance.  Respiratory: Negative for cough and shortness of breath.   Cardiovascular: Negative for chest pain, palpitations and leg swelling.  Gastrointestinal: Negative for nausea, vomiting, abdominal pain, diarrhea, constipation, blood in stool and abdominal distention.  Genitourinary: Negative for dysuria, urgency, frequency, hematuria, flank pain, vaginal bleeding, vaginal discharge, difficulty urinating, vaginal pain and pelvic pain.  Musculoskeletal: Negative for joint swelling, arthralgias and gait problem.  Skin: Positive for rash.  Neurological: Negative for dizziness, syncope, speech difficulty, weakness, numbness and headaches.  Hematological: Negative for adenopathy.  Psychiatric/Behavioral: Negative for behavioral problems, dysphoric mood and agitation. The patient is not nervous/anxious.        Objective:   Physical Exam  Constitutional: She is oriented to person, place, and time. She appears well-developed and well-nourished.       Overweight normal blood pressure   HENT:  Head: Normocephalic.  Right Ear:  External ear normal.  Left Ear: External ear normal.  Mouth/Throat: Oropharynx is clear and moist.  Eyes: Conjunctivae and EOM are normal. Pupils are equal, round, and reactive to light.  Neck: Normal range of motion. Neck supple. No thyromegaly present.  Cardiovascular: Normal rate, regular rhythm, normal heart sounds and intact distal pulses.   Pulmonary/Chest: Effort normal and breath sounds normal.  Abdominal: Soft. Bowel sounds are normal. She exhibits no mass. There is no tenderness.  Musculoskeletal: Normal range of motion.  Lymphadenopathy:    She has no cervical adenopathy.  Neurological: She is alert and oriented to person, place, and time.  Skin: Skin is warm and dry. No rash noted.       2 papules slightly scaly were noted involving the left elbow and right knee area. These appear to be actinic keratoses. They were both treated with cryotherapy  Psychiatric: She has a normal mood and affect. Her behavior is normal.          Assessment & Plan:   Diabetes mellitus. Will continue present therapy appears to be well-controlled we'll check a hemoglobin A1c in 3 months Hypertension well controlled Coronary artery disease stable Actinic keratoses-  2 lesions treated with cryotherapy without complications

## 2012-03-01 NOTE — Patient Instructions (Signed)
Please check your hemoglobin A1c every 3 months  You need to lose weight.  Consider a lower calorie diet and regular exercise.    It is important that you exercise regularly, at least 20 minutes 3 to 4 times per week.  If you develop chest pain or shortness of breath seek  medical attention.  Limit your sodium (Salt) intake

## 2012-03-10 ENCOUNTER — Other Ambulatory Visit: Payer: Self-pay | Admitting: Internal Medicine

## 2012-03-10 DIAGNOSIS — Z1231 Encounter for screening mammogram for malignant neoplasm of breast: Secondary | ICD-10-CM

## 2012-04-09 ENCOUNTER — Other Ambulatory Visit: Payer: Self-pay

## 2012-04-09 DIAGNOSIS — I2510 Atherosclerotic heart disease of native coronary artery without angina pectoris: Secondary | ICD-10-CM

## 2012-04-09 MED ORDER — HYDROCODONE-ACETAMINOPHEN 5-500 MG PO TABS: 1.0000 | ORAL_TABLET | Freq: Four times a day (QID) | ORAL | Status: DC | PRN

## 2012-04-09 NOTE — Telephone Encounter (Signed)
ok 

## 2012-04-09 NOTE — Telephone Encounter (Signed)
Fax refill request from [facility] for vicodin refill Last see 03/01/12  - 3 mos rov  Last written 10/31/11 # [phone] Rf Please advise

## 2012-04-09 NOTE — Telephone Encounter (Signed)
Faxed pre printed form back to [facility]

## 2012-04-15 ENCOUNTER — Ambulatory Visit
Admission: RE | Admit: 2012-04-15 | Discharge: 2012-04-15 | Disposition: A | Discharge status: Home / Self Care | Payer: Medicare Other | Source: Ambulatory Visit | Attending: Internal Medicine | Admitting: Internal Medicine

## 2012-04-15 DIAGNOSIS — Z1231 Encounter for screening mammogram for malignant neoplasm of breast: Secondary | ICD-10-CM

## 2012-05-13 ENCOUNTER — Other Ambulatory Visit: Payer: Self-pay

## 2012-05-13 DIAGNOSIS — I10 Essential (primary) hypertension: Secondary | ICD-10-CM

## 2012-05-13 DIAGNOSIS — E785 Hyperlipidemia, unspecified: Secondary | ICD-10-CM

## 2012-05-13 DIAGNOSIS — E119 Type 2 diabetes mellitus without complications: Secondary | ICD-10-CM

## 2012-05-13 MED ORDER — SIMVASTATIN 20 MG PO TABS: 20.0000 mg | ORAL_TABLET | Freq: Every day | ORAL | Status: DC

## 2012-05-13 MED ORDER — GLIMEPIRIDE 4 MG PO TABS: 4.0000 mg | ORAL_TABLET | ORAL | Status: DC

## 2012-05-13 MED ORDER — FUROSEMIDE 40 MG PO TABS: 40.0000 mg | ORAL_TABLET | Freq: Every day | ORAL | Status: DC

## 2012-06-01 ENCOUNTER — Ambulatory Visit (INDEPENDENT_AMBULATORY_CARE_PROVIDER_SITE_OTHER): Payer: Medicare Other | Admitting: Internal Medicine

## 2012-06-01 ENCOUNTER — Encounter: Payer: Self-pay | Admitting: Internal Medicine

## 2012-06-01 VITALS — BP 150/80 | Temp 98.4°F | Wt 180.0 lb

## 2012-06-01 DIAGNOSIS — I10 Essential (primary) hypertension: Secondary | ICD-10-CM

## 2012-06-01 DIAGNOSIS — E785 Hyperlipidemia, unspecified: Secondary | ICD-10-CM

## 2012-06-01 DIAGNOSIS — E119 Type 2 diabetes mellitus without complications: Secondary | ICD-10-CM

## 2012-06-01 NOTE — Patient Instructions (Signed)
It is important that you exercise regularly, at least 20 minutes 3 to 4 times per week.  If you develop chest pain or shortness of breath seek  medical attention.  You need to lose weight.  Consider a lower calorie diet and regular exercise.   Please check your hemoglobin A1c every 3 months   

## 2012-06-01 NOTE — Progress Notes (Signed)
  Subjective:    Patient ID: [name], female    DOB: [birth year], 71 y.o.   MRN: [id number]  HPI  71 year old patient who is seen today for followup. She has type 2 diabetes blood sugars fluctuate quite a bit. She has been under considerable situational stress due to the poor health another sister who is in the terminal stages of cancer. She has treated hypertension coronary artery disease and dyslipidemia. Her weight is down 2 pounds   Wt Readings from Last 3 Encounters:  06/01/12 180 lb (81.647 kg)  03/01/12 182 lb (82.555 kg)  12/04/11 182 lb (82.555 kg)   Review of Systems  Constitutional: Negative.   HENT: Negative for hearing loss, congestion, sore throat, rhinorrhea, dental problem, sinus pressure and tinnitus.   Eyes: Negative for pain, discharge and visual disturbance.  Respiratory: Negative for cough and shortness of breath.   Cardiovascular: Negative for chest pain, palpitations and leg swelling.  Gastrointestinal: Negative for nausea, vomiting, abdominal pain, diarrhea, constipation, blood in stool and abdominal distention.  Genitourinary: Negative for dysuria, urgency, frequency, hematuria, flank pain, vaginal bleeding, vaginal discharge, difficulty urinating, vaginal pain and pelvic pain.  Musculoskeletal: Negative for joint swelling, arthralgias and gait problem.  Skin: Negative for rash.  Neurological: Negative for dizziness, syncope, speech difficulty, weakness, numbness and headaches.  Hematological: Negative for adenopathy.  Psychiatric/Behavioral: Negative for behavioral problems, dysphoric mood and agitation. The patient is not nervous/anxious.        Objective:   Physical Exam  Constitutional: She is oriented to person, place, and time. She appears well-developed and well-nourished.       Blood pressure 148/70  HENT:  Head: Normocephalic.  Right Ear: External ear normal.  Left Ear: External ear normal.  Mouth/Throat: Oropharynx is clear and moist.  Eyes:  Conjunctivae and EOM are normal. Pupils are equal, round, and reactive to light.  Neck: Normal range of motion. Neck supple. No thyromegaly present.  Cardiovascular: Normal rate, regular rhythm, normal heart sounds and intact distal pulses.   Pulmonary/Chest: Effort normal and breath sounds normal.  Abdominal: Soft. Bowel sounds are normal. She exhibits no mass. There is no tenderness.  Musculoskeletal: Normal range of motion.  Lymphadenopathy:    She has no cervical adenopathy.  Neurological: She is alert and oriented to person, place, and time.  Skin: Skin is warm and dry. No rash noted.  Psychiatric: She has a normal mood and affect. Her behavior is normal.          Assessment & Plan:   Diabetes mellitus. We'll check a hemoglobin A1c weight loss exercise encouraged better diet encouraged  hypertension we'll continue present regimen dyslipidemia we'll continue simvastatin 20 Coronary artery disease asymptomatic We'll see in 3 months for a annual exam

## 2012-06-17 ENCOUNTER — Encounter: Payer: Self-pay | Admitting: Internal Medicine

## 2012-09-03 ENCOUNTER — Ambulatory Visit (INDEPENDENT_AMBULATORY_CARE_PROVIDER_SITE_OTHER): Payer: Medicare Other | Admitting: Internal Medicine

## 2012-09-03 ENCOUNTER — Encounter: Payer: Self-pay | Admitting: Internal Medicine

## 2012-09-03 VITALS — BP 118/70 | HR 68 | Temp 97.6°F | Resp 18 | Ht 62.0 in | Wt 187.0 lb

## 2012-09-03 DIAGNOSIS — E119 Type 2 diabetes mellitus without complications: Secondary | ICD-10-CM

## 2012-09-03 DIAGNOSIS — Z0000 Encounter for general adult medical examination without abnormal findings: Secondary | ICD-10-CM

## 2012-09-03 DIAGNOSIS — E785 Hyperlipidemia, unspecified: Secondary | ICD-10-CM

## 2012-09-03 DIAGNOSIS — I2510 Atherosclerotic heart disease of native coronary artery without angina pectoris: Secondary | ICD-10-CM

## 2012-09-03 DIAGNOSIS — I10 Essential (primary) hypertension: Secondary | ICD-10-CM

## 2012-09-03 DIAGNOSIS — Z23 Encounter for immunization: Secondary | ICD-10-CM

## 2012-09-03 LAB — CBC WITH DIFFERENTIAL/PLATELET
Basophils Absolute: 0 10*3/uL (ref 0.0–0.1)
Eosinophils Relative: 1.6 % (ref 0.0–5.0)
HCT: 32.2 % — ABNORMAL LOW (ref 36.0–46.0)
Lymphs Abs: 1.3 10*3/uL (ref 0.7–4.0)
MCV: 83 fl (ref 78.0–100.0)
Monocytes Absolute: 0.6 10*3/uL (ref 0.1–1.0)
Neutrophils Relative %: 67.7 % (ref 43.0–77.0)
Platelets: 151 10*3/uL (ref 150.0–400.0)
RDW: 14.8 % — ABNORMAL HIGH (ref 11.5–14.6)
WBC: 6.1 10*3/uL (ref 4.5–10.5)

## 2012-09-03 LAB — COMPREHENSIVE METABOLIC PANEL
Albumin: 3.7 g/dL (ref 3.5–5.2)
Alkaline Phosphatase: 55 U/L (ref 39–117)
BUN: 20 mg/dL (ref 6–23)
CO2: 27 mEq/L (ref 19–32)
GFR: 47.11 mL/min — ABNORMAL LOW (ref >60.00)
Glucose, Bld: 227 mg/dL — ABNORMAL HIGH (ref 70–99)
Total Bilirubin: 0.9 mg/dL (ref 0.3–1.2)
Total Protein: 6.6 g/dL (ref 6.0–8.3)

## 2012-09-03 LAB — MICROALBUMIN / CREATININE URINE RATIO
Microalb Creat Ratio: 2.6 mg/g (ref 0.0–30.0)
Microalb, Ur: 3.3 mg/dL — ABNORMAL HIGH (ref 0.0–1.9)

## 2012-09-03 LAB — LIPID PANEL
Cholesterol: 140 mg/dL (ref 0–200)
HDL: 35.8 mg/dL — ABNORMAL LOW (ref >39.00)
VLDL: 29 mg/dL (ref 0.0–40.0)

## 2012-09-03 LAB — HEMOGLOBIN A1C: Hgb A1c MFr Bld: 8.9 % — ABNORMAL HIGH (ref 4.6–6.5)

## 2012-09-03 LAB — TSH: TSH: 1.72 u[IU]/mL (ref 0.35–5.50)

## 2012-09-03 MED ORDER — AMLODIPINE BESYLATE 5 MG PO TABS: 5.0000 mg | ORAL_TABLET | Freq: Every day | ORAL | Status: DC

## 2012-09-03 MED ORDER — GLIMEPIRIDE 4 MG PO TABS: 4.0000 mg | ORAL_TABLET | ORAL | Status: DC

## 2012-09-03 MED ORDER — HYDROCODONE-ACETAMINOPHEN 5-500 MG PO TABS: 1.0000 | ORAL_TABLET | Freq: Four times a day (QID) | ORAL | Status: DC | PRN

## 2012-09-03 MED ORDER — LORAZEPAM 0.5 MG PO TABS: 0.5000 mg | ORAL_TABLET | Freq: Four times a day (QID) | ORAL | Status: DC | PRN

## 2012-09-03 MED ORDER — BENAZEPRIL HCL 40 MG PO TABS: 40.0000 mg | ORAL_TABLET | Freq: Every day | ORAL | Status: DC

## 2012-09-03 MED ORDER — METFORMIN HCL 1000 MG PO TABS: 1000.0000 mg | ORAL_TABLET | Freq: Two times a day (BID) | ORAL | Status: DC

## 2012-09-03 MED ORDER — FUROSEMIDE 40 MG PO TABS: 40.0000 mg | ORAL_TABLET | Freq: Every day | ORAL | Status: DC

## 2012-09-03 MED ORDER — SIMVASTATIN 20 MG PO TABS: 20.0000 mg | ORAL_TABLET | Freq: Every day | ORAL | Status: DC

## 2012-09-03 NOTE — Patient Instructions (Signed)
Limit your sodium (Salt) intake    It is important that you exercise regularly, at least 20 minutes 3 to 4 times per week.  If you develop chest pain or shortness of breath seek  medical attention.   Please check your hemoglobin A1c every 3 months  You need to lose weight.  Consider a lower calorie diet and regular exercise.

## 2012-09-03 NOTE — Progress Notes (Signed)
Subjective:    Patient ID: [name], female    DOB: Mar 12, 1941, 71 y.o.   MRN: [id number]  HPI   71 year old patient who is seen today for a health maintenance examination. Medical problems include type 2 diabetes. She has discontinued Byetta do to cancer concerns. Family history is strongly positive for a number of cancers. She has been attempting to eat healthier. She has coronary artery disease and has had a heart catheterization in 2002. She has single vessel disease at that time. She denies any exertional chest pain. She did have a screening colonoscopy in 2011    Current Allergies:  No known allergies   Past Medical History:  Reviewed history from 12/24/2007 and no changes required:  Diabetes mellitus, type II  Hypertension  Coronary artery disease  Hyperlipidemia  thyroid  Nephrolithiasis, hx of   Family History:  Reviewed history from 12/24/2007 and no changes required:  father died age 87, throat cancer, history of COPD  Four brothers, one died of throat cancer. Another suicide death  82 sisters, one died of uterine cancer; history of breast cancer  One sister recently died from either lung or esophageal cancer another treated with chemotherapy for lung cancer   Social History:  Reviewed history from 12/24/2007 and no changes required:  Former Smoker   Colonoscopy 2011  1. Risk factors, based on past  M,S,F history-  patient has known coronary artery disease status post catheterization 2002 vascular risk factors include diabetes dyslipidemia and hypertension  2.  Physical activities: No activity restrictions ;  little in the way of exercise  3.  Depression/mood: No history depression or mood disorder  4.  Hearing: No major deficits  5.  ADL's: Independent in all aspects of daily living  6.  Fall risk: Low  7.  Home safety: No problems identified  8.  Height weight, and visual acuity; height and weight stable no change in visual acuity eye exam in July of  this year  9.  Counseling: Weight loss more regular exercise encouraged  10. Lab orders based on risk factors: Laboratory profile including lipid panel hemoglobin A1c and urine for microalbuminuria will be reviewed 11. Referral : Not appropriate at this time  12. Care plan: Continue aggressive risk factor modification may need additional medications for diabetic control  13. Cognitive assessment: Alert and oriented normal affect. No cognitive dysfunction       Wt Readings from Last 3 Encounters:  09/03/12 187 lb (84.823 kg)  06/01/12 180 lb (81.647 kg)  03/01/12 182 lb (82.555 kg)    Review of Systems  Constitutional: Negative for fever, appetite change, fatigue and unexpected weight change.  HENT: Negative for hearing loss, ear pain, nosebleeds, congestion, sore throat, mouth sores, trouble swallowing, neck stiffness, dental problem, voice change, sinus pressure and tinnitus.   Eyes: Negative for photophobia, pain, redness and visual disturbance.  Respiratory: Negative for cough, chest tightness and shortness of breath.   Cardiovascular: Negative for chest pain, palpitations and leg swelling.  Gastrointestinal: Negative for nausea, vomiting, abdominal pain, diarrhea, constipation, blood in stool, abdominal distention and rectal pain.  Genitourinary: Negative for dysuria, urgency, frequency, hematuria, flank pain, vaginal bleeding, vaginal discharge, difficulty urinating, genital sores, vaginal pain, menstrual problem and pelvic pain.  Musculoskeletal: Negative for back pain and arthralgias.  Skin: Negative for rash.  Neurological: Negative for dizziness, syncope, speech difficulty, weakness, light-headedness, numbness and headaches.  Hematological: Negative for adenopathy. Does not bruise/bleed easily.  Psychiatric/Behavioral: Negative for suicidal ideas, behavioral  problems, self-injury, dysphoric mood and agitation. The patient is nervous/anxious.        Objective:    Physical Exam  Constitutional: She is oriented to person, place, and time. She appears well-developed and well-nourished.  HENT:  Head: Normocephalic and atraumatic.  Right Ear: External ear normal.  Left Ear: External ear normal.  Mouth/Throat: Oropharynx is clear and moist.  Eyes: Conjunctivae normal and EOM are normal.  Neck: Normal range of motion. Neck supple. No JVD present. No thyromegaly present.       Soft carotid and supraclavicular bruits  Cardiovascular: Normal rate, regular rhythm and intact distal pulses.   Murmur heard.      Grade 2/6 systolic murmur loudest over the primary aortic area Right dorsalis pedis pulse slightly diminished  Pulmonary/Chest: Effort normal and breath sounds normal. She has no wheezes. She has no rales.  Abdominal: Soft. Bowel sounds are normal. She exhibits no distension and no mass. There is no tenderness. There is no rebound and no guarding.  Genitourinary: Vagina normal.  Musculoskeletal: Normal range of motion. She exhibits no edema and no tenderness.  Neurological: She is alert and oriented to person, place, and time. She has normal reflexes. No cranial nerve deficit. She exhibits normal muscle tone. Coordination normal.  Skin: Skin is warm and dry. No rash noted.  Psychiatric: She has a normal mood and affect. Her behavior is normal.          Assessment & Plan:  Preventive health examination  Coronary artery disease remains asymptomatic Diabetes mellitus. Patient has had some weight gain and has also discontinuedByetta. Will check a hemoglobin A1c may need additional medication Hypertension well controlled Dyslipidemia. We'll check a lipid profile Exogenous obesity. Weight loss exercise encouraged  Recheck 3 months  Medications refilled

## 2012-09-06 ENCOUNTER — Other Ambulatory Visit: Payer: Self-pay | Admitting: Internal Medicine

## 2012-09-06 MED ORDER — PIOGLITAZONE HCL 15 MG PO TABS: 15.0000 mg | ORAL_TABLET | Freq: Every day | ORAL | Status: DC

## 2012-09-06 NOTE — Progress Notes (Signed)
Quick Note:  Spoke with pt- informed of lab and new med to add - will send to [facility]. ______

## 2012-12-06 ENCOUNTER — Encounter: Payer: Self-pay | Admitting: Internal Medicine

## 2012-12-06 ENCOUNTER — Ambulatory Visit (INDEPENDENT_AMBULATORY_CARE_PROVIDER_SITE_OTHER): Payer: Medicare Other | Admitting: Internal Medicine

## 2012-12-06 VITALS — BP 120/66 | HR 89 | Temp 98.2°F | Resp 18 | Wt 198.0 lb

## 2012-12-06 DIAGNOSIS — I10 Essential (primary) hypertension: Secondary | ICD-10-CM

## 2012-12-06 DIAGNOSIS — E785 Hyperlipidemia, unspecified: Secondary | ICD-10-CM

## 2012-12-06 DIAGNOSIS — E119 Type 2 diabetes mellitus without complications: Secondary | ICD-10-CM

## 2012-12-06 DIAGNOSIS — I2510 Atherosclerotic heart disease of native coronary artery without angina pectoris: Secondary | ICD-10-CM

## 2012-12-06 MED ORDER — FUROSEMIDE 40 MG PO TABS: 40.0000 mg | ORAL_TABLET | Freq: Every day | ORAL | Status: DC

## 2012-12-06 MED ORDER — BENAZEPRIL HCL 40 MG PO TABS: 40.0000 mg | ORAL_TABLET | Freq: Every day | ORAL | Status: DC

## 2012-12-06 MED ORDER — LORAZEPAM 0.5 MG PO TABS: 0.5000 mg | ORAL_TABLET | Freq: Four times a day (QID) | ORAL | Status: DC | PRN

## 2012-12-06 MED ORDER — METFORMIN HCL 1000 MG PO TABS: 1000.0000 mg | ORAL_TABLET | Freq: Two times a day (BID) | ORAL | Status: DC

## 2012-12-06 MED ORDER — AMLODIPINE BESYLATE 5 MG PO TABS: 5.0000 mg | ORAL_TABLET | Freq: Every day | ORAL | Status: DC

## 2012-12-06 MED ORDER — HYDROCODONE-ACETAMINOPHEN 5-325 MG PO TABS: 1.0000 | ORAL_TABLET | Freq: Four times a day (QID) | ORAL | Status: DC | PRN

## 2012-12-06 MED ORDER — SIMVASTATIN 20 MG PO TABS: 20.0000 mg | ORAL_TABLET | Freq: Every day | ORAL | Status: DC

## 2012-12-06 MED ORDER — PIOGLITAZONE HCL 15 MG PO TABS: 15.0000 mg | ORAL_TABLET | Freq: Every day | ORAL | Status: DC

## 2012-12-06 MED ORDER — MELOXICAM 7.5 MG PO TABS: 7.5000 mg | ORAL_TABLET | Freq: Two times a day (BID) | ORAL | Status: DC | PRN

## 2012-12-06 NOTE — Patient Instructions (Signed)
Limit your sodium (Salt) intake    It is important that you exercise regularly, at least 20 minutes 3 to 4 times per week.  If you develop chest pain or shortness of breath seek  medical attention.   Please check your hemoglobin A1c every 3 months  You need to lose weight.  Consider a lower calorie diet and regular exercise.

## 2012-12-06 NOTE — Progress Notes (Signed)
Subjective:    Patient ID: [name], female    DOB: 1941-06-05, 71 y.o.   MRN: [id number]  HPI  71 year old patient who is seen today for followup of her type 2 diabetes. 3 months ago her hemoglobin A1c was elevated at 8.9 after discontinuation of Byetta. She is reluctant to take this medication do to side effect and cancer concerns. There's been significant weight gain over the past 3 months. She was placed on pioglitazone at that time no cardiopulmonary complaints. She does have a history of coronary artery disease.  Past Medical History  Diagnosis Date  . DIABETES MELLITUS, TYPE II 07/13/2007  . HYPERLIPIDEMIA 12/24/2007  . HYPERTENSION 07/13/2007  . CORONARY ARTERY DISEASE 12/24/2007  . NEPHROLITHIASIS, HX OF 12/24/2007    History   Social History  . Marital Status: Married    Spouse Name: N/A    Number of Children: N/A  . Years of Education: N/A   Occupational History  . Not on file.   Social History Main Topics  . Smoking status: Former Smoker    Types: Cigarettes    Quit date: 12/08/2000  . Smokeless tobacco: Never Used  . Alcohol Use: No  . Drug Use: No  . Sexually Active: Not on file   Other Topics Concern  . Not on file   Social History Narrative  . No narrative on file    Past Surgical History  Procedure Date  . Excisional hemorrhoidectomy unknown  . Abdominal hysterectomy unknown  . Nasal sinus surgery unknown  . Cardiac catheterization 2002    Family History  Problem Relation Age of Onset  . Cancer Father     throat ca  . COPD Father   . Cancer Sister     uterine ca and breast ca  . Cancer Brother     throat    No Known Allergies  Current Outpatient Prescriptions on File Prior to Visit  Medication Sig Dispense Refill  . amLODipine (NORVASC) 5 MG tablet Take 1 tablet (5 mg total) by mouth daily.  90 tablet  3  . aspirin 325 MG tablet Take 325 mg by mouth daily.        . benazepril (LOTENSIN) 40 MG tablet Take 1 tablet (40 mg total) by mouth  daily.  90 tablet  3  . furosemide (LASIX) 40 MG tablet Take 1 tablet (40 mg total) by mouth daily.  90 tablet  3  . glimepiride (AMARYL) 4 MG tablet Take 1 tablet (4 mg total) by mouth every morning.  90 tablet  3  . glucose blood (ONE TOUCH ULTRA TEST) test strip Use as instructed   100 each  4  . HYDROcodone-acetaminophen (VICODIN) 5-500 MG per tablet Take 1 tablet by mouth every 6 (six) hours as needed.  90 tablet  1  . Insulin Syringe-Needle U-100 (B-D INSULIN SYRINGE) 31G X 5/16" 0.3 ML MISC As directed   180 each  4  . LORazepam (ATIVAN) 0.5 MG tablet Take 1 tablet (0.5 mg total) by mouth every 6 (six) hours as needed.  60 tablet  4  . meloxicam (MOBIC) 7.5 MG tablet Take 1 tablet (7.5 mg total) by mouth 2 (two) times daily as needed.  180 tablet  3  . metFORMIN (GLUCOPHAGE) 1000 MG tablet Take 1 tablet (1,000 mg total) by mouth 2 (two) times daily with a meal.  180 tablet  3  . pioglitazone (ACTOS) 15 MG tablet Take 1 tablet (15 mg total) by mouth daily.  90 tablet  3  . simvastatin (ZOCOR) 20 MG tablet Take 1 tablet (20 mg total) by mouth at bedtime.  90 tablet  3    BP 120/66  Pulse 89  Temp 98.2 F (36.8 C) (Oral)  Resp 18  Wt 198 lb (89.812 kg)  SpO2 95%       Review of Systems  Constitutional: Positive for unexpected weight change.  HENT: Negative for hearing loss, congestion, sore throat, rhinorrhea, dental problem, sinus pressure and tinnitus.   Eyes: Negative for pain, discharge and visual disturbance.  Respiratory: Negative for cough and shortness of breath.   Cardiovascular: Negative for chest pain, palpitations and leg swelling.  Gastrointestinal: Negative for nausea, vomiting, abdominal pain, diarrhea, constipation, blood in stool and abdominal distention.  Genitourinary: Negative for dysuria, urgency, frequency, hematuria, flank pain, vaginal bleeding, vaginal discharge, difficulty urinating, vaginal pain and pelvic pain.  Musculoskeletal: Negative for joint  swelling, arthralgias and gait problem.  Skin: Negative for rash.  Neurological: Negative for dizziness, syncope, speech difficulty, weakness, numbness and headaches.  Hematological: Negative for adenopathy.  Psychiatric/Behavioral: Negative for behavioral problems, dysphoric mood and agitation. The patient is not nervous/anxious.        Objective:   Physical Exam  Constitutional: She is oriented to person, place, and time. She appears well-developed and well-nourished.        Weight 198  Blood pressure low normal  HENT:  Head: Normocephalic.  Right Ear: External ear normal.  Left Ear: External ear normal.  Mouth/Throat: Oropharynx is clear and moist.  Eyes: Conjunctivae normal and EOM are normal. Pupils are equal, round, and reactive to light.  Neck: Normal range of motion. Neck supple. No thyromegaly present.  Cardiovascular: Normal rate, regular rhythm, normal heart sounds and intact distal pulses.   Pulmonary/Chest: Effort normal and breath sounds normal.  Abdominal: Soft. Bowel sounds are normal. She exhibits no mass. There is no tenderness.  Musculoskeletal: Normal range of motion.  Lymphadenopathy:    She has no cervical adenopathy.  Neurological: She is alert and oriented to person, place, and time.  Skin: Skin is warm and dry. No rash noted.  Psychiatric: She has a normal mood and affect. Her behavior is normal.          Assessment & Plan:    Diabetes mellitus. Will check a hemoglobin A1c Obesity/weight gain Hypertension well controlled CAD stable  Nonpharmacologic measures discussed Recheck 3 months

## 2012-12-06 NOTE — Progress Notes (Signed)
  Subjective:    Patient ID: [name], female    DOB: Apr 15, 1941, 71 y.o.   MRN: [id number]  HPI  Wt Readings from Last 3 Encounters:  12/06/12 198 lb (89.812 kg)  09/03/12 187 lb (84.823 kg)  06/01/12 180 lb (81.647 kg)    Review of Systems     Objective:   Physical Exam        Assessment & Plan:

## 2012-12-07 LAB — HEMOGLOBIN A1C: Hgb A1c MFr Bld: 7.7 % — ABNORMAL HIGH (ref 4.6–6.5)

## 2013-01-28 ENCOUNTER — Other Ambulatory Visit: Payer: Self-pay | Admitting: *Deleted

## 2013-01-28 DIAGNOSIS — E785 Hyperlipidemia, unspecified: Secondary | ICD-10-CM

## 2013-01-28 DIAGNOSIS — E119 Type 2 diabetes mellitus without complications: Secondary | ICD-10-CM

## 2013-01-28 DIAGNOSIS — I10 Essential (primary) hypertension: Secondary | ICD-10-CM

## 2013-01-28 MED ORDER — GLIMEPIRIDE 4 MG PO TABS: 4.0000 mg | ORAL_TABLET | ORAL | Status: DC

## 2013-01-28 MED ORDER — FUROSEMIDE 40 MG PO TABS: 40.0000 mg | ORAL_TABLET | Freq: Every day | ORAL | Status: DC

## 2013-01-28 MED ORDER — SIMVASTATIN 20 MG PO TABS: 20.0000 mg | ORAL_TABLET | Freq: Every day | ORAL | Status: DC

## 2013-01-28 MED ORDER — BENAZEPRIL HCL 40 MG PO TABS: 40.0000 mg | ORAL_TABLET | Freq: Every day | ORAL | Status: DC

## 2013-03-07 ENCOUNTER — Ambulatory Visit: Payer: Medicare Other | Admitting: Internal Medicine

## 2013-03-23 ENCOUNTER — Other Ambulatory Visit: Payer: Self-pay

## 2013-03-23 DIAGNOSIS — Z1231 Encounter for screening mammogram for malignant neoplasm of breast: Secondary | ICD-10-CM

## 2013-04-21 ENCOUNTER — Ambulatory Visit
Admission: RE | Admit: 2013-04-21 | Discharge: 2013-04-21 | Disposition: A | Discharge status: Home / Self Care | Payer: Medicare Other | Source: Ambulatory Visit

## 2013-04-21 ENCOUNTER — Ambulatory Visit (INDEPENDENT_AMBULATORY_CARE_PROVIDER_SITE_OTHER): Payer: Medicare Other | Admitting: Internal Medicine

## 2013-04-21 ENCOUNTER — Other Ambulatory Visit: Payer: Self-pay | Admitting: *Deleted

## 2013-04-21 ENCOUNTER — Encounter: Payer: Self-pay | Admitting: Internal Medicine

## 2013-04-21 ENCOUNTER — Ambulatory Visit: Payer: Medicare Other

## 2013-04-21 VITALS — BP 150/80 | HR 109 | Temp 98.7°F | Resp 20 | Wt 210.0 lb

## 2013-04-21 DIAGNOSIS — E119 Type 2 diabetes mellitus without complications: Secondary | ICD-10-CM

## 2013-04-21 DIAGNOSIS — I10 Essential (primary) hypertension: Secondary | ICD-10-CM

## 2013-04-21 DIAGNOSIS — D649 Anemia, unspecified: Secondary | ICD-10-CM

## 2013-04-21 DIAGNOSIS — Z87442 Personal history of urinary calculi: Secondary | ICD-10-CM

## 2013-04-21 DIAGNOSIS — R350 Frequency of micturition: Secondary | ICD-10-CM

## 2013-04-21 DIAGNOSIS — M545 Low back pain, unspecified: Secondary | ICD-10-CM

## 2013-04-21 DIAGNOSIS — Z1231 Encounter for screening mammogram for malignant neoplasm of breast: Secondary | ICD-10-CM

## 2013-04-21 DIAGNOSIS — I2510 Atherosclerotic heart disease of native coronary artery without angina pectoris: Secondary | ICD-10-CM

## 2013-04-21 LAB — POCT URINALYSIS DIPSTICK
Spec Grav, UA: 1.015
Urobilinogen, UA: 0.2

## 2013-04-21 MED ORDER — LIRAGLUTIDE 18 MG/3ML ~~LOC~~ SOPN: 1.8000 mg | PEN_INJECTOR | Freq: Every day | SUBCUTANEOUS | Status: DC

## 2013-04-21 NOTE — Patient Instructions (Signed)
Limit your sodium (Salt) intake    It is important that you exercise regularly, at least 20 minutes 3 to 4 times per week.  If you develop chest pain or shortness of breath seek  medical attention.  You need to lose weight.  Consider a lower calorie diet and regular exercise. 

## 2013-04-21 NOTE — Telephone Encounter (Signed)
Pt needs refill

## 2013-04-21 NOTE — Progress Notes (Signed)
Subjective:    Patient ID: [name], female    DOB: Sep 15, 1941, 72 y.o.   MRN: [id number]  HPI  72 year old patient who is seen today for followup of her diabetes. She has not been seen in a number of months. She has been on triple oral therapy which has included Actos 15 mg daily. She complains of weight gain and increasing the peel edema. She has been on Victoza in the past but this was discontinued due to poor insurance coverage.  She has no insurance coverage and she feels this may be covered at this time. She has treated hypertension as well as coronary artery disease. Other complaints include left lumbar pain as well as painful knees and legs. Medical regimen does include Mobic.  There's some nonspecific weakness and fatigue. She is on anti-inflammatory medication and does have a history of mild anemia.  Past Medical History  Diagnosis Date  . DIABETES MELLITUS, TYPE II 07/13/2007  . HYPERLIPIDEMIA 12/24/2007  . HYPERTENSION 07/13/2007  . CORONARY ARTERY DISEASE 12/24/2007  . NEPHROLITHIASIS, HX OF 12/24/2007    History   Social History  . Marital Status: Married    Spouse Name: N/A    Number of Children: N/A  . Years of Education: N/A   Occupational History  . Not on file.   Social History Main Topics  . Smoking status: Former Smoker    Types: Cigarettes    Quit date: 12/08/2000  . Smokeless tobacco: Never Used  . Alcohol Use: No  . Drug Use: No  . Sexually Active: Not on file   Other Topics Concern  . Not on file   Social History Narrative  . No narrative on file    Past Surgical History  Procedure Laterality Date  . Excisional hemorrhoidectomy  unknown  . Abdominal hysterectomy  unknown  . Nasal sinus surgery  unknown  . Cardiac catheterization  2002    Family History  Problem Relation Age of Onset  . Cancer Father     throat ca  . COPD Father   . Cancer Sister     uterine ca and breast ca  . Cancer Brother     throat    No Known  Allergies  Current Outpatient Prescriptions on File Prior to Visit  Medication Sig Dispense Refill  . amLODipine (NORVASC) 5 MG tablet Take 1 tablet (5 mg total) by mouth daily.  90 tablet  3  . aspirin 325 MG tablet Take 325 mg by mouth daily.        . benazepril (LOTENSIN) 40 MG tablet Take 1 tablet (40 mg total) by mouth daily.  90 tablet  3  . furosemide (LASIX) 40 MG tablet Take 1 tablet (40 mg total) by mouth daily.  90 tablet  3  . glimepiride (AMARYL) 4 MG tablet Take 1 tablet (4 mg total) by mouth every morning.  90 tablet  3  . glucose blood (ONE TOUCH ULTRA TEST) test strip Use as instructed   100 each  4  . HYDROcodone-acetaminophen (NORCO/VICODIN) 5-325 MG per tablet Take 1 tablet by mouth every 6 (six) hours as needed for pain.  270 tablet  1  . Insulin Syringe-Needle U-100 (B-D INSULIN SYRINGE) 31G X 5/16" 0.3 ML MISC As directed   180 each  4  . LORazepam (ATIVAN) 0.5 MG tablet Take 1 tablet (0.5 mg total) by mouth every 6 (six) hours as needed.  180 tablet  1  . meloxicam (MOBIC) 7.5 MG tablet Take  1 tablet (7.5 mg total) by mouth 2 (two) times daily as needed.  180 tablet  1  . metFORMIN (GLUCOPHAGE) 1000 MG tablet Take 1 tablet (1,000 mg total) by mouth 2 (two) times daily with a meal.  180 tablet  3  . pioglitazone (ACTOS) 15 MG tablet Take 1 tablet (15 mg total) by mouth daily.  90 tablet  3  . simvastatin (ZOCOR) 20 MG tablet Take 1 tablet (20 mg total) by mouth at bedtime.  90 tablet  3   No current facility-administered medications on file prior to visit.    BP 150/80  Pulse 109  Temp(Src) 98.7 F (37.1 C) (Oral)  Resp 20  Wt 210 lb (95.255 kg)  BMI 38.4 kg/m2  SpO2 98%     Wt Readings from Last 3 Encounters:  04/21/13 210 lb (95.255 kg)  12/06/12 198 lb (89.812 kg)  09/03/12 187 lb (84.823 kg)    Review of Systems  Constitutional: Positive for fatigue and unexpected weight change.  HENT: Negative for hearing loss, congestion, sore throat, rhinorrhea,  dental problem, sinus pressure and tinnitus.   Eyes: Negative for pain, discharge and visual disturbance.  Respiratory: Negative for cough and shortness of breath.   Cardiovascular: Positive for leg swelling. Negative for chest pain and palpitations.  Gastrointestinal: Negative for nausea, vomiting, abdominal pain, diarrhea, constipation, blood in stool and abdominal distention.  Genitourinary: Negative for dysuria, urgency, frequency, hematuria, flank pain, vaginal bleeding, vaginal discharge, difficulty urinating, vaginal pain and pelvic pain.  Musculoskeletal: Positive for back pain and arthralgias. Negative for joint swelling and gait problem.  Skin: Negative for rash.  Neurological: Positive for weakness. Negative for dizziness, syncope, speech difficulty, numbness and headaches.  Hematological: Negative for adenopathy.  Psychiatric/Behavioral: Negative for behavioral problems, dysphoric mood and agitation. The patient is not nervous/anxious.        Objective:   Physical Exam  Constitutional: She is oriented to person, place, and time. She appears well-developed and well-nourished.  HENT:  Head: Normocephalic.  Right Ear: External ear normal.  Left Ear: External ear normal.  Mouth/Throat: Oropharynx is clear and moist.  Eyes: Conjunctivae and EOM are normal. Pupils are equal, round, and reactive to light.  Neck: Normal range of motion. Neck supple. No thyromegaly present.  Cardiovascular: Normal rate, regular rhythm, normal heart sounds and intact distal pulses.   Pulmonary/Chest: Effort normal and breath sounds normal.  Abdominal: Soft. Bowel sounds are normal. She exhibits no mass. There is no tenderness.  Musculoskeletal: Normal range of motion.  Trace pedal edema  Lymphadenopathy:    She has no cervical adenopathy.  Neurological: She is alert and oriented to person, place, and time.  Skin: Skin is warm and dry. No rash noted.  Psychiatric: She has a normal mood and affect.  Her behavior is normal.          Assessment & Plan:  Diabetes mellitus We'll discontinue Actos and resume Victoza. Weight loss exercise discussed History of anemia. We'll check a followup CBC. Patient is on Mobic Hypertension stable CAD stable   Recheck 3 months

## 2013-04-22 LAB — CBC WITH DIFFERENTIAL/PLATELET
Basophils Absolute: 0.1 10*3/uL (ref 0.0–0.1)
Eosinophils Absolute: 0.1 10*3/uL (ref 0.0–0.7)
HCT: 32.2 % — ABNORMAL LOW (ref 36.0–46.0)
Lymphs Abs: 1.3 10*3/uL (ref 0.7–4.0)
MCHC: 33.6 g/dL (ref 30.0–36.0)
Monocytes Absolute: 0.6 10*3/uL (ref 0.1–1.0)
Monocytes Relative: 7.3 % (ref 3.0–12.0)
Platelets: 183 10*3/uL (ref 150.0–400.0)
RDW: 16 % — ABNORMAL HIGH (ref 11.5–14.6)

## 2013-04-22 LAB — HEMOGLOBIN A1C: Hgb A1c MFr Bld: 8.1 % — ABNORMAL HIGH (ref 4.6–6.5)

## 2013-04-22 MED ORDER — HYDROCODONE-ACETAMINOPHEN 5-325 MG PO TABS: 1.0000 | ORAL_TABLET | Freq: Four times a day (QID) | ORAL | Status: DC | PRN

## 2013-04-22 NOTE — Telephone Encounter (Signed)
Rx for Hydrocodone faxed to [place].

## 2013-05-10 ENCOUNTER — Emergency Department (HOSPITAL_COMMUNITY): Payer: No Typology Code available for payment source

## 2013-05-10 ENCOUNTER — Encounter (HOSPITAL_COMMUNITY): Payer: Self-pay | Admitting: *Deleted

## 2013-05-10 ENCOUNTER — Emergency Department (HOSPITAL_COMMUNITY)
Admission: EM | Admit: 2013-05-10 | Discharge: 2013-05-10 | Disposition: A | Discharge status: Home / Self Care | Payer: No Typology Code available for payment source | Attending: Emergency Medicine | Admitting: Emergency Medicine

## 2013-05-10 DIAGNOSIS — E119 Type 2 diabetes mellitus without complications: Secondary | ICD-10-CM | POA: Insufficient documentation

## 2013-05-10 DIAGNOSIS — Z7982 Long term (current) use of aspirin: Secondary | ICD-10-CM | POA: Insufficient documentation

## 2013-05-10 DIAGNOSIS — Z79899 Other long term (current) drug therapy: Secondary | ICD-10-CM | POA: Insufficient documentation

## 2013-05-10 DIAGNOSIS — M545 Low back pain, unspecified: Secondary | ICD-10-CM | POA: Insufficient documentation

## 2013-05-10 DIAGNOSIS — E785 Hyperlipidemia, unspecified: Secondary | ICD-10-CM | POA: Insufficient documentation

## 2013-05-10 DIAGNOSIS — Z87891 Personal history of nicotine dependence: Secondary | ICD-10-CM | POA: Insufficient documentation

## 2013-05-10 DIAGNOSIS — Y9389 Activity, other specified: Secondary | ICD-10-CM | POA: Insufficient documentation

## 2013-05-10 DIAGNOSIS — M25519 Pain in unspecified shoulder: Secondary | ICD-10-CM | POA: Insufficient documentation

## 2013-05-10 DIAGNOSIS — I10 Essential (primary) hypertension: Secondary | ICD-10-CM | POA: Insufficient documentation

## 2013-05-10 DIAGNOSIS — Y92410 Unspecified street and highway as the place of occurrence of the external cause: Secondary | ICD-10-CM | POA: Insufficient documentation

## 2013-05-10 DIAGNOSIS — Z87442 Personal history of urinary calculi: Secondary | ICD-10-CM | POA: Insufficient documentation

## 2013-05-10 DIAGNOSIS — M549 Dorsalgia, unspecified: Secondary | ICD-10-CM

## 2013-05-10 DIAGNOSIS — I2510 Atherosclerotic heart disease of native coronary artery without angina pectoris: Secondary | ICD-10-CM | POA: Insufficient documentation

## 2013-05-10 MED ORDER — PROMETHAZINE HCL 12.5 MG PO TABS: 25.0000 mg | ORAL_TABLET | Freq: Four times a day (QID) | ORAL | Status: DC | PRN

## 2013-05-10 MED ORDER — ONDANSETRON 8 MG PO TBDP
8.0000 mg | ORAL_TABLET | Freq: Once | ORAL | Status: AC
  Administered 2013-05-10: 8 mg via ORAL
  Filled 2013-05-10: qty 1

## 2013-05-10 MED ORDER — HYDROCODONE-ACETAMINOPHEN 5-325 MG PO TABS: 1.0000 | ORAL_TABLET | Freq: Four times a day (QID) | ORAL | Status: DC | PRN

## 2013-05-10 MED ORDER — HYDROCODONE-ACETAMINOPHEN 5-325 MG PO TABS
2.0000 | ORAL_TABLET | Freq: Once | ORAL | Status: AC
  Administered 2013-05-10: 2 via ORAL
  Filled 2013-05-10: qty 2
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
## 2013-05-10 NOTE — ED Notes (Signed)
Patient returned from X-ray 

## 2013-05-11 NOTE — ED Provider Notes (Signed)
Medical screening examination/treatment/procedure(s) were performed by non-physician practitioner and as supervising physician I was immediately available for consultation/collaboration.   [name], MD 05/11/13 Bosie Helper

## 2013-07-04 ENCOUNTER — Other Ambulatory Visit: Payer: Self-pay | Admitting: *Deleted

## 2013-07-04 MED ORDER — LORAZEPAM 0.5 MG PO TABS: 0.5000 mg | ORAL_TABLET | Freq: Four times a day (QID) | ORAL | Status: DC | PRN

## 2013-07-04 NOTE — Telephone Encounter (Signed)
Rx faxed to [facility].

## 2013-07-21 ENCOUNTER — Ambulatory Visit (INDEPENDENT_AMBULATORY_CARE_PROVIDER_SITE_OTHER): Payer: Self-pay | Admitting: Internal Medicine

## 2013-07-21 ENCOUNTER — Encounter: Payer: Self-pay | Admitting: Internal Medicine

## 2013-07-21 VITALS — BP 150/70 | HR 100 | Temp 98.7°F | Resp 20 | Wt 198.0 lb

## 2013-07-21 DIAGNOSIS — E785 Hyperlipidemia, unspecified: Secondary | ICD-10-CM

## 2013-07-21 DIAGNOSIS — E119 Type 2 diabetes mellitus without complications: Secondary | ICD-10-CM

## 2013-07-21 DIAGNOSIS — I10 Essential (primary) hypertension: Secondary | ICD-10-CM

## 2013-07-21 DIAGNOSIS — I2510 Atherosclerotic heart disease of native coronary artery without angina pectoris: Secondary | ICD-10-CM

## 2013-07-21 NOTE — Progress Notes (Signed)
Subjective:    Patient ID: [name], female    DOB: 06-06-1941, 72 y.o.   MRN: [id number]  HPI    72 year old patient who is seen today for followup. She has diabetes dyslipidemia hypertension and coronary artery disease. She has had much improved glycemic control since starting Victoza; however she now is having a difficult time of pain almost $500 for a three-month supply. She will attempt to enroll in a patient assistance program. Samples provided today She has hypertension which has been stable. Denies any exertional chest pain. She has been on lorazepam which she takes up to 4 times daily. Moderation this medication discussed  Past Medical History  Diagnosis Date  . DIABETES MELLITUS, TYPE II 07/13/2007  . HYPERLIPIDEMIA 12/24/2007  . HYPERTENSION 07/13/2007  . CORONARY ARTERY DISEASE 12/24/2007  . NEPHROLITHIASIS, HX OF 12/24/2007    History   Social History  . Marital Status: Married    Spouse Name: N/A    Number of Children: N/A  . Years of Education: N/A   Occupational History  . Not on file.   Social History Main Topics  . Smoking status: Former Smoker    Types: Cigarettes    Quit date: 12/08/2000  . Smokeless tobacco: Never Used  . Alcohol Use: No  . Drug Use: No  . Sexual Activity: Not on file   Other Topics Concern  . Not on file   Social History Narrative  . No narrative on file    Past Surgical History  Procedure Laterality Date  . Excisional hemorrhoidectomy  unknown  . Abdominal hysterectomy  unknown  . Nasal sinus surgery  unknown  . Cardiac catheterization  2002    Family History  Problem Relation Age of Onset  . Cancer Father     throat ca  . COPD Father   . Cancer Sister     uterine ca and breast ca  . Cancer Brother     throat    No Known Allergies  Current Outpatient Prescriptions on File Prior to Visit  Medication Sig Dispense Refill  . amLODipine (NORVASC) 5 MG tablet Take 5 mg by mouth daily.      Marland Kitchen aspirin 325 MG tablet Take  325 mg by mouth daily.        . benazepril (LOTENSIN) 40 MG tablet Take 40 mg by mouth daily.      . furosemide (LASIX) 40 MG tablet Take 40 mg by mouth daily.      Marland Kitchen glimepiride (AMARYL) 4 MG tablet Take 4 mg by mouth daily before breakfast.      . HYDROcodone-acetaminophen (NORCO/VICODIN) 5-325 MG per tablet Take 1 tablet by mouth every 6 (six) hours as needed for pain.  6 tablet  0  . Liraglutide (VICTOZA) 18 MG/3ML SOPN Inject 1.8 mg into the skin daily.      Marland Kitchen LORazepam (ATIVAN) 0.5 MG tablet Take 1 tablet (0.5 mg total) by mouth every 6 (six) hours as needed for anxiety.  90 tablet  1  . meloxicam (MOBIC) 7.5 MG tablet Take 7.5 mg by mouth daily.      . metFORMIN (GLUCOPHAGE) 1000 MG tablet Take 1,000 mg by mouth 2 (two) times daily with a meal.      . promethazine (PHENERGAN) 12.5 MG tablet Take 2 tablets (25 mg total) by mouth every 6 (six) hours as needed for nausea.  12 tablet  0  . simvastatin (ZOCOR) 20 MG tablet Take 20 mg by mouth every evening.  No current facility-administered medications on file prior to visit.    BP 150/70  Pulse 100  Temp(Src) 98.7 F (37.1 C) (Oral)  Resp 20  Wt 198 lb (89.812 kg)  BMI 36.21 kg/m2  SpO2 98%    Lab Results  Component Value Date   HGBA1C 8.1* 04/21/2013   BP Readings from Last 3 Encounters:  05/10/13 121/70  04/21/13 150/80  12/06/12 120/66    Wt Readings from Last 3 Encounters:  04/21/13 210 lb (95.255 kg)  12/06/12 198 lb (89.812 kg)  09/03/12 187 lb (84.823 kg)     Review of Systems  Constitutional: Negative.   HENT: Negative for hearing loss, congestion, sore throat, rhinorrhea, dental problem, sinus pressure and tinnitus.   Eyes: Negative for pain, discharge and visual disturbance.  Respiratory: Negative for cough and shortness of breath.   Cardiovascular: Negative for chest pain, palpitations and leg swelling.  Gastrointestinal: Negative for nausea, vomiting, abdominal pain, diarrhea, constipation, blood in  stool and abdominal distention.  Genitourinary: Negative for dysuria, urgency, frequency, hematuria, flank pain, vaginal bleeding, vaginal discharge, difficulty urinating, vaginal pain and pelvic pain.  Musculoskeletal: Negative for joint swelling, arthralgias and gait problem.  Skin: Negative for rash.  Neurological: Negative for dizziness, syncope, speech difficulty, weakness, numbness and headaches.  Hematological: Negative for adenopathy.  Psychiatric/Behavioral: Negative for behavioral problems, dysphoric mood and agitation. The patient is nervous/anxious.        Objective:   Physical Exam  Constitutional: She is oriented to person, place, and time. She appears well-developed and well-nourished.  HENT:  Head: Normocephalic.  Right Ear: External ear normal.  Left Ear: External ear normal.  Mouth/Throat: Oropharynx is clear and moist.  Eyes: Conjunctivae and EOM are normal. Pupils are equal, round, and reactive to light.  Neck: Normal range of motion. Neck supple. No thyromegaly present.  Carotid and supraclavicular bruits  Cardiovascular: Normal rate, regular rhythm and intact distal pulses.   Murmur heard. Pulmonary/Chest: Effort normal and breath sounds normal.  Abdominal: Soft. Bowel sounds are normal. She exhibits no mass. There is no tenderness.  Musculoskeletal: Normal range of motion.  Lymphadenopathy:    She has no cervical adenopathy.  Neurological: She is alert and oriented to person, place, and time.  Skin: Skin is warm and dry. No rash noted.  Psychiatric: She has a normal mood and affect. Her behavior is normal.          Assessment & Plan:   DM2 appears to be better controlled. Will check HghA1C HTN stable HLD  CAD  Anxiety disorder- moderation of  lorazepam discussed

## 2013-07-21 NOTE — Patient Instructions (Signed)
Please check your hemoglobin A1c every 3 months  You need to lose weight.  Consider a lower calorie diet and regular exercise.    It is important that you exercise regularly, at least 20 minutes 3 to 4 times per week.  If you develop chest pain or shortness of breath seek  medical attention.

## 2013-07-22 LAB — HEMOGLOBIN A1C: Hgb A1c MFr Bld: 6.7 % — ABNORMAL HIGH (ref 4.6–6.5)

## 2013-07-25 ENCOUNTER — Telehealth: Payer: Self-pay | Admitting: Internal Medicine

## 2013-07-25 MED ORDER — LIRAGLUTIDE 18 MG/3ML ~~LOC~~ SOPN: 1.8000 mg | PEN_INJECTOR | Freq: Every day | SUBCUTANEOUS | Status: DC

## 2013-07-25 NOTE — Telephone Encounter (Signed)
Pt called and requested a refill of her Liraglutide (VICTOZA) 18 MG/3ML SOPN. She would like these called into [facility] on [address].

## 2013-07-25 NOTE — Telephone Encounter (Signed)
Spoke to pt asked her how many pens she is getting at one time? Pt stated 3 month supply so 9 pens and she is paying $400 some dollars at a time now. Told pt I have a discount card for her that says $25 dollars for Rx she can pick up and also I can give her a sample. Pt verbalized understanding and stated she will have her daughter pick it up for her. Told her that is fine. Rx sent to pharmacy.

## 2013-08-26 ENCOUNTER — Other Ambulatory Visit: Payer: Self-pay | Admitting: *Deleted

## 2013-08-26 MED ORDER — MELOXICAM 7.5 MG PO TABS: 7.5000 mg | ORAL_TABLET | Freq: Every day | ORAL | Status: DC

## 2013-08-31 ENCOUNTER — Telehealth: Payer: Self-pay | Admitting: Internal Medicine

## 2013-08-31 NOTE — Telephone Encounter (Signed)
Called pt told her I do have sample of Victoza two boxes will put back for you. Pt verbalized understanding.

## 2013-08-31 NOTE — Telephone Encounter (Signed)
Pt would like to know if we have samples (three) of Liraglutide (VICTOZA) 18 MG/3ML SOPN Pt states it is $400 for her to get this pen and she can not afford. Pt's would like to know before lunch if possible.

## 2013-09-14 ENCOUNTER — Ambulatory Visit (INDEPENDENT_AMBULATORY_CARE_PROVIDER_SITE_OTHER): Payer: Medicare Other

## 2013-09-14 DIAGNOSIS — Z23 Encounter for immunization: Secondary | ICD-10-CM

## 2013-10-24 ENCOUNTER — Telehealth: Payer: Self-pay | Admitting: Internal Medicine

## 2013-10-24 NOTE — Telephone Encounter (Signed)
Pt is out of Liraglutide (VICTOZA) 18 MG/3ML SOPN. Pt states she is in the doughnut hole and needs samples. Pt ran out of this med and states her sugar ran up to over 500 over the weekend. Butch Penny was able to get samples and pt will pick up.

## 2013-10-28 ENCOUNTER — Encounter: Payer: Self-pay | Admitting: Internal Medicine

## 2013-10-31 ENCOUNTER — Encounter: Payer: Self-pay | Admitting: Internal Medicine

## 2013-10-31 ENCOUNTER — Ambulatory Visit (INDEPENDENT_AMBULATORY_CARE_PROVIDER_SITE_OTHER): Payer: Medicare Other | Admitting: Internal Medicine

## 2013-10-31 VITALS — BP 150/60 | HR 101 | Temp 98.2°F | Resp 20 | Wt 190.0 lb

## 2013-10-31 DIAGNOSIS — I2510 Atherosclerotic heart disease of native coronary artery without angina pectoris: Secondary | ICD-10-CM

## 2013-10-31 DIAGNOSIS — I10 Essential (primary) hypertension: Secondary | ICD-10-CM

## 2013-10-31 DIAGNOSIS — E119 Type 2 diabetes mellitus without complications: Secondary | ICD-10-CM

## 2013-10-31 DIAGNOSIS — E785 Hyperlipidemia, unspecified: Secondary | ICD-10-CM

## 2013-10-31 DIAGNOSIS — Z23 Encounter for immunization: Secondary | ICD-10-CM

## 2013-10-31 NOTE — Progress Notes (Signed)
Subjective:    Patient ID: [name], female    DOB: September 06, 1941, 72 y.o.   MRN: [id number]  HPI Pre-visit discussion using our clinic review tool. No additional management support is needed unless otherwise documented below in the visit note.  Her quarterly followup. Medical problems include diabetes. Her last hemoglobin A1c is well controlled at 6.7. She is now in the doughnut hole having a difficult time pain for her medications. She has treated hypertension dyslipidemia and coronary artery disease.  Blood sugars are generally well controlled but she states that she was briefly out of medications with blood sugars approaching 500;  blood sugars are now fairly normal  Past Medical History  Diagnosis Date  . DIABETES MELLITUS, TYPE II 07/13/2007  . HYPERLIPIDEMIA 12/24/2007  . HYPERTENSION 07/13/2007  . CORONARY ARTERY DISEASE 12/24/2007  . NEPHROLITHIASIS, HX OF 12/24/2007    History   Social History  . Marital Status: Married    Spouse Name: N/A    Number of Children: N/A  . Years of Education: N/A   Occupational History  . Not on file.   Social History Main Topics  . Smoking status: Former Smoker    Types: Cigarettes    Quit date: 12/08/2000  . Smokeless tobacco: Never Used  . Alcohol Use: No  . Drug Use: No  . Sexual Activity: Not on file   Other Topics Concern  . Not on file   Social History Narrative  . No narrative on file    Past Surgical History  Procedure Laterality Date  . Excisional hemorrhoidectomy  unknown  . Abdominal hysterectomy  unknown  . Nasal sinus surgery  unknown  . Cardiac catheterization  2002    Family History  Problem Relation Age of Onset  . Cancer Father     throat ca  . COPD Father   . Cancer Sister     uterine ca and breast ca  . Cancer Brother     throat    No Known Allergies  Current Outpatient Prescriptions on File Prior to Visit  Medication Sig Dispense Refill  . amLODipine (NORVASC) 5 MG tablet Take 5 mg by mouth  daily.      Marland Kitchen aspirin 325 MG tablet Take 325 mg by mouth daily.        . benazepril (LOTENSIN) 40 MG tablet Take 40 mg by mouth daily.      . furosemide (LASIX) 40 MG tablet Take 40 mg by mouth daily.      Marland Kitchen glimepiride (AMARYL) 4 MG tablet Take 4 mg by mouth daily before breakfast.      . HYDROcodone-acetaminophen (NORCO/VICODIN) 5-325 MG per tablet Take 1 tablet by mouth every 6 (six) hours as needed for pain.  6 tablet  0  . Liraglutide (VICTOZA) 18 MG/3ML SOPN Inject 1.8 mg into the skin daily.  9 pen  1  . LORazepam (ATIVAN) 0.5 MG tablet Take 1 tablet (0.5 mg total) by mouth every 6 (six) hours as needed for anxiety.  90 tablet  1  . meloxicam (MOBIC) 7.5 MG tablet Take 1 tablet (7.5 mg total) by mouth daily.  90 tablet  1  . metFORMIN (GLUCOPHAGE) 1000 MG tablet Take 1,000 mg by mouth 2 (two) times daily with a meal.      . promethazine (PHENERGAN) 12.5 MG tablet Take 2 tablets (25 mg total) by mouth every 6 (six) hours as needed for nausea.  12 tablet  0  . simvastatin (ZOCOR) 20 MG tablet  Take 20 mg by mouth every evening.       No current facility-administered medications on file prior to visit.    BP 150/60  Pulse 101  Temp(Src) 98.2 F (36.8 C) (Oral)  Resp 20  Wt 190 lb (86.183 kg)  SpO2 97%     Review of Systems  Constitutional: Negative.   HENT: Negative for congestion, dental problem, hearing loss, rhinorrhea, sinus pressure, sore throat and tinnitus.   Eyes: Negative for pain, discharge and visual disturbance.  Respiratory: Negative for cough and shortness of breath.   Cardiovascular: Negative for chest pain, palpitations and leg swelling.  Gastrointestinal: Negative for nausea, vomiting, abdominal pain, diarrhea, constipation, blood in stool and abdominal distention.  Genitourinary: Negative for dysuria, urgency, frequency, hematuria, flank pain, vaginal bleeding, vaginal discharge, difficulty urinating, vaginal pain and pelvic pain.  Musculoskeletal: Negative for  arthralgias, gait problem and joint swelling.  Skin: Negative for rash.  Neurological: Negative for dizziness, syncope, speech difficulty, weakness, numbness and headaches.  Hematological: Negative for adenopathy.  Psychiatric/Behavioral: Negative for behavioral problems, dysphoric mood and agitation. The patient is not nervous/anxious.        Objective:   Physical Exam  Constitutional: She is oriented to person, place, and time. She appears well-developed and well-nourished.  HENT:  Head: Normocephalic.  Right Ear: External ear normal.  Left Ear: External ear normal.  Mouth/Throat: Oropharynx is clear and moist.  Eyes: Conjunctivae and EOM are normal. Pupils are equal, round, and reactive to light.  Neck: Normal range of motion. Neck supple. No thyromegaly present.  Cardiovascular: Normal rate, regular rhythm, normal heart sounds and intact distal pulses.   Pulmonary/Chest: Effort normal and breath sounds normal.  Abdominal: Soft. Bowel sounds are normal. She exhibits no mass. There is no tenderness.  Musculoskeletal: Normal range of motion.  Lymphadenopathy:    She has no cervical adenopathy.  Neurological: She is alert and oriented to person, place, and time.  Skin: Skin is warm and dry. No rash noted.  Psychiatric: She has a normal mood and affect. Her behavior is normal.          Assessment & Plan:   Diabetes mellitus. We'll check a hemoglobin A1c. Samples dispensed Hypertension stable CAD stable Dyslipidemia. Continue simvastatin  Recheck 3 months Continue efforts at weight loss. Patient has had a steady 8 to 10 pounds of weight loss each quarter for the past 2 visits

## 2013-10-31 NOTE — Patient Instructions (Signed)
Limit your sodium (Salt) intake   Please check your hemoglobin A1c every 3 months  You need to lose weight.  Consider a lower calorie diet and regular exercise.    It is important that you exercise regularly, at least 20 minutes 3 to 4 times per week.  If you develop chest pain or shortness of breath seek  medical attention. 

## 2013-10-31 NOTE — Progress Notes (Signed)
Pre-visit discussion using our clinic review tool. No additional management support is needed unless otherwise documented below in the visit note.  

## 2013-10-31 NOTE — Progress Notes (Signed)
  Subjective:    Patient ID: [name], female    DOB: Apr 11, 1941, 72 y.o.   MRN: [id number]  HPI   Wt Readings from Last 3 Encounters:  10/31/13 190 lb (86.183 kg)  07/21/13 198 lb (89.812 kg)  04/21/13 210 lb (95.255 kg)   Review of Systems     Objective:   Physical Exam        Assessment & Plan:

## 2013-11-01 LAB — HEMOGLOBIN A1C: Hgb A1c MFr Bld: 8 % — ABNORMAL HIGH (ref 4.6–6.5)

## 2013-11-04 ENCOUNTER — Telehealth: Payer: Self-pay | Admitting: Internal Medicine

## 2013-11-04 ENCOUNTER — Ambulatory Visit: Payer: Medicare Other | Admitting: Internal Medicine

## 2013-11-04 NOTE — Telephone Encounter (Signed)
Pt returned your call concerning labs

## 2013-11-04 NOTE — Telephone Encounter (Signed)
Pt aware, see result note under labs.

## 2013-12-08 DIAGNOSIS — Z9289 Personal history of other medical treatment: Secondary | ICD-10-CM

## 2013-12-08 HISTORY — DX: Personal history of other medical treatment: Z92.89

## 2013-12-27 ENCOUNTER — Telehealth: Payer: Self-pay | Admitting: Internal Medicine

## 2013-12-27 NOTE — Telephone Encounter (Signed)
Pt was given samples of invokana. Pt would like more samples if none avail call [facility] [address]

## 2013-12-27 NOTE — Telephone Encounter (Signed)
Left detailed message need to know what dosage if Invokanna she is on so I can check for samples?

## 2013-12-28 NOTE — Telephone Encounter (Signed)
Pt notified samples of Invokanna will be at the front desk.

## 2013-12-28 NOTE — Telephone Encounter (Addendum)
300 mg invokana. (Enough to get until feb 23).  pls  Call @ [phone] sk for Denay Laforte.

## 2014-01-20 ENCOUNTER — Telehealth: Payer: Self-pay | Admitting: Internal Medicine

## 2014-01-20 MED ORDER — METFORMIN HCL 1000 MG PO TABS: 1000.0000 mg | ORAL_TABLET | Freq: Two times a day (BID) | ORAL | Status: DC

## 2014-01-20 NOTE — Telephone Encounter (Signed)
Rx sent to pharmacy   

## 2014-01-20 NOTE — Telephone Encounter (Signed)
Pt need refill on metformin 1000mg  twice a day #180 w/refills call into [facility] [address]. Pt has appt this month

## 2014-01-29 ENCOUNTER — Encounter (HOSPITAL_COMMUNITY): Payer: Self-pay | Admitting: Emergency Medicine

## 2014-01-29 ENCOUNTER — Emergency Department (HOSPITAL_COMMUNITY): Payer: Medicare HMO

## 2014-01-29 ENCOUNTER — Observation Stay (HOSPITAL_COMMUNITY)
Admission: EM | Admit: 2014-01-29 | Discharge: 2014-01-31 | Disposition: A | Discharge status: Home / Self Care | Payer: Medicare HMO | Attending: Internal Medicine | Admitting: Internal Medicine

## 2014-01-29 DIAGNOSIS — Z860100 Personal history of colon polyps, unspecified: Secondary | ICD-10-CM | POA: Insufficient documentation

## 2014-01-29 DIAGNOSIS — R1013 Epigastric pain: Secondary | ICD-10-CM | POA: Diagnosis not present

## 2014-01-29 DIAGNOSIS — K7460 Unspecified cirrhosis of liver: Secondary | ICD-10-CM | POA: Diagnosis present

## 2014-01-29 DIAGNOSIS — D649 Anemia, unspecified: Secondary | ICD-10-CM | POA: Insufficient documentation

## 2014-01-29 DIAGNOSIS — R52 Pain, unspecified: Secondary | ICD-10-CM

## 2014-01-29 DIAGNOSIS — K859 Acute pancreatitis without necrosis or infection, unspecified: Secondary | ICD-10-CM | POA: Diagnosis not present

## 2014-01-29 DIAGNOSIS — N289 Disorder of kidney and ureter, unspecified: Secondary | ICD-10-CM

## 2014-01-29 DIAGNOSIS — K869 Disease of pancreas, unspecified: Secondary | ICD-10-CM | POA: Diagnosis not present

## 2014-01-29 DIAGNOSIS — K862 Cyst of pancreas: Secondary | ICD-10-CM

## 2014-01-29 DIAGNOSIS — IMO0001 Reserved for inherently not codable concepts without codable children: Secondary | ICD-10-CM | POA: Diagnosis present

## 2014-01-29 DIAGNOSIS — Z87891 Personal history of nicotine dependence: Secondary | ICD-10-CM | POA: Insufficient documentation

## 2014-01-29 DIAGNOSIS — Z7982 Long term (current) use of aspirin: Secondary | ICD-10-CM | POA: Insufficient documentation

## 2014-01-29 DIAGNOSIS — N182 Chronic kidney disease, stage 2 (mild): Secondary | ICD-10-CM

## 2014-01-29 DIAGNOSIS — K863 Pseudocyst of pancreas: Secondary | ICD-10-CM

## 2014-01-29 DIAGNOSIS — I2510 Atherosclerotic heart disease of native coronary artery without angina pectoris: Secondary | ICD-10-CM | POA: Insufficient documentation

## 2014-01-29 DIAGNOSIS — E785 Hyperlipidemia, unspecified: Secondary | ICD-10-CM | POA: Insufficient documentation

## 2014-01-29 DIAGNOSIS — R110 Nausea: Secondary | ICD-10-CM

## 2014-01-29 DIAGNOSIS — D3500 Benign neoplasm of unspecified adrenal gland: Secondary | ICD-10-CM | POA: Insufficient documentation

## 2014-01-29 DIAGNOSIS — I129 Hypertensive chronic kidney disease with stage 1 through stage 4 chronic kidney disease, or unspecified chronic kidney disease: Secondary | ICD-10-CM | POA: Insufficient documentation

## 2014-01-29 DIAGNOSIS — Z794 Long term (current) use of insulin: Secondary | ICD-10-CM

## 2014-01-29 DIAGNOSIS — E119 Type 2 diabetes mellitus without complications: Secondary | ICD-10-CM

## 2014-01-29 DIAGNOSIS — R933 Abnormal findings on diagnostic imaging of other parts of digestive tract: Secondary | ICD-10-CM

## 2014-01-29 DIAGNOSIS — I10 Essential (primary) hypertension: Secondary | ICD-10-CM | POA: Diagnosis present

## 2014-01-29 LAB — BASIC METABOLIC PANEL
BUN: 28 mg/dL — AB (ref 6–23)
CHLORIDE: 102 meq/L (ref 96–112)
CO2: 24 mEq/L (ref 19–32)
Calcium: 9.5 mg/dL (ref 8.4–10.5)
Creatinine, Ser: 1.53 mg/dL — ABNORMAL HIGH (ref 0.50–1.10)
GFR calc Af Amer: 38 mL/min — ABNORMAL LOW (ref >90)
GFR calc non Af Amer: 33 mL/min — ABNORMAL LOW (ref >90)
GLUCOSE: 147 mg/dL — AB (ref 70–99)
POTASSIUM: 4.2 meq/L (ref 3.7–5.3)
Sodium: 144 mEq/L (ref 137–147)

## 2014-01-29 LAB — CBC
HEMATOCRIT: 37.7 % (ref 36.0–46.0)
HEMOGLOBIN: 12.2 g/dL (ref 12.0–15.0)
MCH: 25.9 pg — ABNORMAL LOW (ref 26.0–34.0)
MCHC: 32.4 g/dL (ref 30.0–36.0)
MCV: 80 fL (ref 78.0–100.0)
Platelets: 180 10*3/uL (ref 150–400)
RBC: 4.71 MIL/uL (ref 3.87–5.11)
RDW: 15.3 % (ref 11.5–15.5)
WBC: 10.9 10*3/uL — AB (ref 4.0–10.5)

## 2014-01-29 LAB — HEPATIC FUNCTION PANEL
ALK PHOS: 177 U/L — AB (ref 39–117)
ALT: 14 U/L (ref 0–35)
AST: 25 U/L (ref 0–37)
Albumin: 4.2 g/dL (ref 3.5–5.2)
BILIRUBIN TOTAL: 0.2 mg/dL — AB (ref 0.3–1.2)
Total Protein: 8.2 g/dL (ref 6.0–8.3)

## 2014-01-29 LAB — LIPASE, BLOOD: Lipase: 61 U/L — ABNORMAL HIGH (ref 11–59)

## 2014-01-29 LAB — GLUCOSE, CAPILLARY
GLUCOSE-CAPILLARY: 123 mg/dL — AB (ref 70–99)
Glucose-Capillary: 137 mg/dL — ABNORMAL HIGH (ref 70–99)

## 2014-01-29 LAB — I-STAT TROPONIN, ED: Troponin i, poc: 0 ng/mL (ref 0.00–0.08)

## 2014-01-29 MED ORDER — BENAZEPRIL HCL 40 MG PO TABS: 40.0000 mg | ORAL_TABLET | Freq: Every day | ORAL | Status: DC

## 2014-01-29 MED ORDER — INSULIN ASPART 100 UNIT/ML ~~LOC~~ SOLN
0.0000 [IU] | SUBCUTANEOUS | Status: DC
  Administered 2014-01-29 – 2014-01-30 (×2): 2 [IU] via SUBCUTANEOUS
  Administered 2014-01-30: 3 [IU] via SUBCUTANEOUS
  Administered 2014-01-31: 2 [IU] via SUBCUTANEOUS

## 2014-01-29 MED ORDER — MORPHINE SULFATE 4 MG/ML IJ SOLN
4.0000 mg | Freq: Once | INTRAMUSCULAR | Status: AC
  Administered 2014-01-29: 4 mg via INTRAVENOUS
  Filled 2014-01-29: qty 1

## 2014-01-29 MED ORDER — IOHEXOL 300 MG/ML  SOLN
25.0000 mL | Freq: Once | INTRAMUSCULAR | Status: AC | PRN
  Administered 2014-01-29: 25 mL via ORAL

## 2014-01-29 MED ORDER — HYDROMORPHONE HCL PF 1 MG/ML IJ SOLN
1.0000 mg | INTRAMUSCULAR | Status: DC | PRN
  Administered 2014-01-30 (×2): 1 mg via INTRAVENOUS
  Filled 2014-01-29 (×2): qty 1

## 2014-01-29 MED ORDER — PROMETHAZINE HCL 25 MG PO TABS: 25.0000 mg | ORAL_TABLET | Freq: Four times a day (QID) | ORAL | Status: DC | PRN

## 2014-01-29 MED ORDER — ASPIRIN 325 MG PO TABS
325.0000 mg | ORAL_TABLET | Freq: Every day | ORAL | Status: DC
  Administered 2014-01-30 – 2014-01-31 (×2): 325 mg via ORAL
  Filled 2014-01-29 (×2): qty 1

## 2014-01-29 MED ORDER — IOHEXOL 300 MG/ML  SOLN
100.0000 mL | Freq: Once | INTRAMUSCULAR | Status: AC | PRN
  Administered 2014-01-29: 100 mL via INTRAVENOUS

## 2014-01-29 MED ORDER — LORAZEPAM 0.5 MG PO TABS: 0.5000 mg | ORAL_TABLET | Freq: Four times a day (QID) | ORAL | Status: DC | PRN

## 2014-01-29 MED ORDER — SODIUM CHLORIDE 0.9 % IV SOLN
INTRAVENOUS | Status: DC
  Administered 2014-01-29 – 2014-01-30 (×3): via INTRAVENOUS
  Administered 2014-01-30: 20 mL/h via INTRAVENOUS

## 2014-01-29 MED ORDER — HYDROMORPHONE HCL PF 1 MG/ML IJ SOLN
1.0000 mg | Freq: Once | INTRAMUSCULAR | Status: AC
  Administered 2014-01-29: 1 mg via INTRAVENOUS
  Filled 2014-01-29 (×2): qty 1

## 2014-01-29 MED ORDER — ONDANSETRON HCL 4 MG/2ML IJ SOLN
4.0000 mg | Freq: Once | INTRAMUSCULAR | Status: AC
  Administered 2014-01-29: 4 mg via INTRAVENOUS
  Filled 2014-01-29: qty 2

## 2014-01-29 MED ORDER — AMLODIPINE BESYLATE 5 MG PO TABS
5.0000 mg | ORAL_TABLET | Freq: Every day | ORAL | Status: DC
  Administered 2014-01-30 – 2014-01-31 (×2): 5 mg via ORAL
  Filled 2014-01-29 (×2): qty 1

## 2014-01-29 MED ORDER — ONDANSETRON HCL 4 MG/2ML IJ SOLN
4.0000 mg | Freq: Four times a day (QID) | INTRAMUSCULAR | Status: DC | PRN
  Administered 2014-01-30: 4 mg via INTRAVENOUS
  Filled 2014-01-29: qty 2

## 2014-01-29 MED ORDER — HEPARIN SODIUM (PORCINE) 5000 UNIT/ML IJ SOLN
5000.0000 [IU] | Freq: Three times a day (TID) | INTRAMUSCULAR | Status: DC
  Administered 2014-01-30 – 2014-01-31 (×5): 5000 [IU] via SUBCUTANEOUS
  Filled 2014-01-29 (×8): qty 1

## 2014-01-29 MED ORDER — SIMVASTATIN 20 MG PO TABS
20.0000 mg | ORAL_TABLET | Freq: Every evening | ORAL | Status: DC
  Administered 2014-01-29 – 2014-01-31 (×3): 20 mg via ORAL
  Filled 2014-01-29 (×3): qty 1

## 2014-01-29 MED ORDER — MELOXICAM 7.5 MG PO TABS
7.5000 mg | ORAL_TABLET | Freq: Every day | ORAL | Status: DC
  Administered 2014-01-30 – 2014-01-31 (×2): 7.5 mg via ORAL
  Filled 2014-01-29 (×2): qty 1

## 2014-01-29 NOTE — ED Notes (Signed)
[place] [place] notified pt done with contrast.

## 2014-01-29 NOTE — ED Notes (Signed)
Pt to radiology.

## 2014-01-29 NOTE — ED Notes (Signed)
Report given to floor RN. No questions at this time. RN informed that pt will get dilaudid now in ED before being transported.

## 2014-01-29 NOTE — ED Notes (Signed)
Pt returned from radiology.

## 2014-01-29 NOTE — ED Notes (Signed)
Ct called about delay in contrast; RN offered to go get; CT working on it now. Pt aware.

## 2014-01-29 NOTE — ED Notes (Signed)
MD at bedside. 

## 2014-01-29 NOTE — ED Notes (Signed)
Per pt sts epigastric pain since this am. sts some nausea and gassiness. sts belching but not feeling any better. sts it hurts when she takes a deep breath.

## 2014-01-29 NOTE — H&P (Addendum)
[facility] History and Physical  [name] [id number] DOB: 1941-06-08 DOA: 01/29/2014  Referring physician: EDP PCP: [name], MD   Chief Complaint: Abdominal pain   HPI: [name] is a 73 y.o. female who presents to the ED with 1 day history of abdominal pain.  Pain is located in epigastric area, radiates to back, worse when lying down.  Associated with nausea, no vomiting nor diarrhea.  Pain constant over the course of the day.  Pain is pressure like sensation.  Review of Systems: Systems reviewed.  As above, otherwise negative  Past Medical History  Diagnosis Date  . DIABETES MELLITUS, TYPE II 07/13/2007  . HYPERLIPIDEMIA 12/24/2007  . HYPERTENSION 07/13/2007  . CORONARY ARTERY DISEASE 12/24/2007  . NEPHROLITHIASIS, HX OF 12/24/2007  . Tubular adenoma of colon    Past Surgical History  Procedure Laterality Date  . Excisional hemorrhoidectomy  unknown  . Abdominal hysterectomy  unknown  . Nasal sinus surgery  unknown  . Cardiac catheterization  2002   Social History:  reports that she quit smoking about 13 years ago. Her smoking use included Cigarettes. She smoked 0.00 packs per day. She has never used smokeless tobacco. She reports that she does not drink alcohol or use illicit drugs.  No Known Allergies  Family History  Problem Relation Age of Onset  . Cancer Father     throat ca  . COPD Father   . Cancer Sister     uterine ca and breast ca  . Cancer Brother     throat     Prior to Admission medications   Medication Sig Start Date End Date Taking? Authorizing Provider  amLODipine (NORVASC) 5 MG tablet Take 5 mg by mouth daily.   Yes Historical Provider, MD  aspirin 325 MG tablet Take 325 mg by mouth daily.    Yes Historical Provider, MD  benazepril (LOTENSIN) 40 MG tablet Take 40 mg by mouth daily.   Yes Historical Provider, MD  furosemide (LASIX) 40 MG tablet Take 40 mg by mouth daily.   Yes Historical Provider, MD  glimepiride  (AMARYL) 4 MG tablet Take 4 mg by mouth daily before breakfast.   Yes Historical Provider, MD  HYDROcodone-acetaminophen (NORCO/VICODIN) 5-325 MG per tablet Take 1 tablet by mouth every 6 (six) hours as needed for pain. 05/10/13  Yes Elwyn Lade, PA-C  LORazepam (ATIVAN) 0.5 MG tablet Take 1 tablet (0.5 mg total) by mouth every 6 (six) hours as needed for anxiety. 07/04/13  Yes Marletta Lor, MD  meloxicam (MOBIC) 7.5 MG tablet Take 1 tablet (7.5 mg total) by mouth daily. 08/26/13  Yes Marletta Lor, MD  metFORMIN (GLUCOPHAGE) 1000 MG tablet Take 1 tablet (1,000 mg total) by mouth 2 (two) times daily with a meal. 01/20/14  Yes Marletta Lor, MD  promethazine (PHENERGAN) 12.5 MG tablet Take 2 tablets (25 mg total) by mouth every 6 (six) hours as needed for nausea. 05/10/13  Yes Elwyn Lade, PA-C  simvastatin (ZOCOR) 20 MG tablet Take 20 mg by mouth every evening.   Yes Historical Provider, MD   Physical Exam: Filed Vitals:   01/29/14 2100  BP: 122/49  Pulse: 81  Temp:   Resp: 22    BP 122/49  Pulse 81  Temp(Src) 98 F (36.7 C) (Oral)  Resp 22  SpO2 93%  General Appearance:    Alert, oriented, no distress, appears stated age  Head:    Normocephalic, atraumatic  Eyes:  PERRL, EOMI, sclera non-icteric        Nose:   Nares without drainage or epistaxis. Mucosa, turbinates normal  Throat:   Dry mucous membranes. Oropharynx without erythema or exudate.  Neck:   Supple. No carotid bruits.  No thyromegaly.  No lymphadenopathy.   Back:     No CVA tenderness, no spinal tenderness  Lungs:     Clear to auscultation bilaterally, without wheezes, rhonchi or rales  Chest wall:    No tenderness to palpitation  Heart:    Regular rate and rhythm without murmurs, gallops, rubs  Abdomen:     Soft, severe epigastric tenderness, nondistended, normal bowel sounds, no organomegaly  Genitalia:    deferred  Rectal:    deferred  Extremities:   No clubbing, cyanosis or edema.   Pulses:   2+ and symmetric all extremities  Skin:   Skin color, texture, turgor normal, no rashes or lesions  Lymph nodes:   Cervical, supraclavicular, and axillary nodes normal  Neurologic:   CNII-XII intact. Normal strength, sensation and reflexes      throughout    Labs on Admission:  Basic Metabolic Panel:  Recent Labs Lab 01/29/14 1430  NA 144  K 4.2  CL 102  CO2 24  GLUCOSE 147*  BUN 28*  CREATININE 1.53*  CALCIUM 9.5   Liver Function Tests:  Recent Labs Lab 01/29/14 1430  AST 25  ALT 14  ALKPHOS 177*  BILITOT 0.2*  PROT 8.2  ALBUMIN 4.2    Recent Labs Lab 01/29/14 1430  LIPASE 61*   No results found for this basename: AMMONIA,  in the last 168 hours CBC:  Recent Labs Lab 01/29/14 1430  WBC 10.9*  HGB 12.2  HCT 37.7  MCV 80.0  PLT 180   Cardiac Enzymes: No results found for this basename: CKTOTAL, CKMB, CKMBINDEX, TROPONINI,  in the last 168 hours  BNP (last 3 results) No results found for this basename: PROBNP,  in the last 8760 hours CBG:  Recent Labs Lab 01/29/14 2201  GLUCAP 123*    Radiological Exams on Admission: Dg Chest 2 View  01/29/2014   CLINICAL DATA:  Abdominal pain  EXAM: CHEST  2 VIEW  COMPARISON:  None  FINDINGS: The heart size and mediastinal contours appear normal. There is no pleural effusion or edema. No airspace consolidation identified. Degenerative mild to moderate degenerative disc disease is noted within the lower thoracic spine.  IMPRESSION: No active cardiopulmonary disease.   Electronically Signed   By: Kerby Moors M.D.   On: 01/29/2014 17:06   US Abdomen Complete  01/29/2014   CLINICAL DATA:  Epigastric abdominal pain. Current history of diabetes and hypertension. Prior history of urinary tract calculi.  EXAM: ULTRASOUND ABDOMEN COMPLETE  COMPARISON:  CT abdomen and pelvis 02/13/2007.  FINDINGS: Gallbladder:  No shadowing gallstones or echogenic sludge. No gallbladder wall thickening or pericholecystic  fluid. Negative sonographic Murphy sign according to the ultrasound technologist.  Common bile duct:  Diameter: Approximately 4 mm diameter.  Liver:  Normal size and echotexture without focal parenchymal abnormality. Patent portal vein.  IVC:  Patent.  Pancreas:  Thickened, heterogeneous head and proximal body. Distal body and tail obscured by overlying bowel gas.  Spleen:  Borderline enlarged measuring approximately 13.1 x 5.1 x 12.6 cm, yielding a volume of approximately 420 cc. No focal splenic parenchymal abnormality.  Right Kidney:  Length: Approximately 10.7 cm. Mild diffuse cortical thinning consistent with age. No hydronephrosis. No focal parenchymal abnormality. No visible  shadowing calculi.  Left Kidney:  Length: Approximately 11.3 cm. Mild cortical thinning involving the upper pole with well-preserved cortex elsewhere. No hydronephrosis. No focal parenchymal abnormality. No visible shadowing calculi.  Abdominal aorta:  Normal in caliber throughout its visualized course in the abdomen with evidence of atherosclerosis. Obscured distally by overlying bowel gas. Maximum diameter 2.4 cm.  Other findings:  None.  IMPRESSION: 1. Thickened, heterogeneous pancreatic head and proximal body. Is there clinical evidence of pancreatitis? Distal body and tail were obscured by overlying bowel gas. 2. Borderline splenomegaly without focal splenic parenchymal abnormality. 3. Normal-appearing gallbladder.  No biliary ductal dilation.   Electronically Signed   By: Evangeline Dakin M.D.   On: 01/29/2014 16:46   Ct Abdomen Pelvis W Contrast  01/29/2014   CLINICAL DATA:  Abdominal and pelvic pain with nausea.  EXAM: CT ABDOMEN AND PELVIS WITH CONTRAST  TECHNIQUE: Multidetector CT imaging of the abdomen and pelvis was performed using the standard protocol following bolus administration of intravenous contrast.  CONTRAST:  150mL OMNIPAQUE IOHEXOL 300 MG/ML  SOLN  COMPARISON:  02/13/2007 CT and 01/29/2014 ultrasound  FINDINGS:  The liver has a slightly nodular contour suspicious for cirrhosis.  The spleen is upper limits of normal in size.  Mild to moderate bilateral renal cortical thinning is noted.  Adrenal glands are unremarkable except for at a stable small left adrenal myelolipoma.  There has been interval development of a 1.1 x 1.5 cm cystic structure within the pancreatic head (image number 33). There is no evidence of pancreatic ductal dilatation or the pancreatic abnormality.  There is no evidence of abdominal aortic aneurysm, biliary dilatation or worrisome enlarged lymph nodes.  There are few upper normal caliber small bowel loops within the mid abdomen containing gas and some fluid - nonspecific. There is no evidence of bowel obstruction, pneumoperitoneum, or abscess.  Remainder of the bowel and appendix are unremarkable.  The bladder is within normal limits.  The patient is status post hysterectomy.  No acute or suspicious bony abnormalities are noted. Degenerative changes within the lumbar spine are again noted. Marland Kitchen  IMPRESSION: Nonspecific nonobstructive bowel gas pattern with upper limits of normal caliber gas and fluid filled small bowel in the mid abdomen. This probably represents a mild ileus.  1.1 x 1.5 cm pancreatic head cystic structure - new since 2008. MR followup in 1 year is recommended to evaluate stability. This may represent an indolent cystic lesion or IPMN.  Probable cirrhosis.  Upper limits of normal spleen size.   Electronically Signed   By: [name] M.D.   On: 01/29/2014 20:49    EKG: Independently reviewed.  Assessment/Plan Principal Problem:   Pancreatitis Active Problems:   DIABETES MELLITUS, TYPE II   HYPERTENSION   Renal insufficiency   Lesion of pancreas   1. Pancreatitis - very mild by lab work, patient on pain control, IVF, monitor labs. 2. Lesion of pancreas - CT and ultrasound reveal a new pancreatic head cystic structure not present in 2008.  This could be an indolent cyst, however  IPMN is also a possibility and the patient certainly has a family history worrisome enough for this (everyone in family just about has cancer).  Given family history, unexplained nature of this lesion, the well described pre-cancerous nature of IPMNs, and the family and patients concern; do feel that it would be most appropriate to follow this up for definitive diagnosis.  Should consult with GI (patient sees Dr. Henrene Pastor), for referral for outpatient follow up  following this admission (may need ERCP for diagnosis). 3. Renal insufficiency - patient on IVF, holding lasix, holding lisinopril, last labs were in 2010 which showed creatinine of 1.2, now 1.5, unclear how much of this is acute today vs chronic progression from DM.  Repeat labs in AM. 4. DM2 - holding home meds and putting patient on Q4H mod dose SSI.    Code Status: Full Code  Family Communication: Daughter at bedside Disposition Plan: Admit to obs   Time spent: 70 min  Obediah Welles M. [facility] Pager [phone]  If 7AM-7PM, please contact the day team taking care of the patient [email or web address] Password [facility] 01/29/2014, 10:20 PM

## 2014-01-29 NOTE — ED Provider Notes (Signed)
CSN: [id number]     Arrival date & time 01/29/14  1402 History   First MD Initiated Contact with Patient 01/29/14 1457     Chief Complaint  Patient presents with  . Chest Pain     (Consider location/radiation/quality/duration/timing/severity/associated sxs/prior Treatment) HPI Patient reports she has had epigastric pain since she was awakened at 6 AM this morning. She describes the pain as a pressure sensation and she can feel her heart beating. She states she also has a heaviness to her epigastric area. She states sometimes it radiates to her back. The pain has been there constantly all day. She states laying flat makes it feel worse, nothing made it feel better. She tried aspirin without relief. She has mild shortness of breath, she describes diaphoresis. She has had nausea without vomiting. She states she's never had this before. She states she has not noticed any food intolerances lately. She states last night she ate hot wings. She cannot recall if this discomfort is similar to when she had her heart disease.  PCP Dr [facility] [place]  Past Medical History  Diagnosis Date  . DIABETES MELLITUS, TYPE II 07/13/2007  . HYPERLIPIDEMIA 12/24/2007  . HYPERTENSION 07/13/2007  . CORONARY ARTERY DISEASE 12/24/2007  . NEPHROLITHIASIS, HX OF 12/24/2007   Past Surgical History  Procedure Laterality Date  . Excisional hemorrhoidectomy  unknown  . Abdominal hysterectomy  unknown  . Nasal sinus surgery  unknown  . Cardiac catheterization  2002   Family History  Problem Relation Age of Onset  . Cancer Father     throat ca  . COPD Father   . Cancer Sister     uterine ca and breast ca  . Cancer Brother     throat   History  Substance Use Topics  . Smoking status: Former Smoker    Types: Cigarettes    Quit date: 12/08/2000  . Smokeless tobacco: Never Used  . Alcohol Use: No   Lives at home Lives with spouse   OB History   Grav Para Term Preterm Abortions TAB SAB Ect Mult  Living                 Review of Systems  All other systems reviewed and are negative.      Allergies  Review of patient's allergies indicates no known allergies.  Home Medications   Current Outpatient Rx  Name  Route  Sig  Dispense  Refill  . amLODipine (NORVASC) 5 MG tablet   Oral   Take 5 mg by mouth daily.         Marland Kitchen aspirin 325 MG tablet   Oral   Take 325 mg by mouth daily.          . benazepril (LOTENSIN) 40 MG tablet   Oral   Take 40 mg by mouth daily.         . furosemide (LASIX) 40 MG tablet   Oral   Take 40 mg by mouth daily.         Marland Kitchen glimepiride (AMARYL) 4 MG tablet   Oral   Take 4 mg by mouth daily before breakfast.         . HYDROcodone-acetaminophen (NORCO/VICODIN) 5-325 MG per tablet   Oral   Take 1 tablet by mouth every 6 (six) hours as needed for pain.   6 tablet   0   . LORazepam (ATIVAN) 0.5 MG tablet   Oral   Take 1 tablet (0.5 mg total) by mouth  every 6 (six) hours as needed for anxiety.   90 tablet   1   . meloxicam (MOBIC) 7.5 MG tablet   Oral   Take 1 tablet (7.5 mg total) by mouth daily.   90 tablet   1   . metFORMIN (GLUCOPHAGE) 1000 MG tablet   Oral   Take 1 tablet (1,000 mg total) by mouth 2 (two) times daily with a meal.   180 tablet   0   . promethazine (PHENERGAN) 12.5 MG tablet   Oral   Take 2 tablets (25 mg total) by mouth every 6 (six) hours as needed for nausea.   12 tablet   0   . simvastatin (ZOCOR) 20 MG tablet   Oral   Take 20 mg by mouth every evening.          BP 157/61  Pulse 84  Temp(Src) 98 F (36.7 C) (Oral)  Resp 16  SpO2 95%  Vital signs normal   Physical Exam  Nursing note and vitals reviewed. Constitutional: She is oriented to person, place, and time. She appears well-developed and well-nourished.  Non-toxic appearance. She does not appear ill. She appears distressed.  HENT:  Head: Normocephalic and atraumatic.  Right Ear: External ear normal.  Left Ear: External ear  normal.  Nose: Nose normal. No mucosal edema or rhinorrhea.  Mouth/Throat: Oropharynx is clear and moist and mucous membranes are normal. No dental abscesses or uvula swelling.  Eyes: Conjunctivae and EOM are normal. Pupils are equal, round, and reactive to light.  Neck: Normal range of motion and full passive range of motion without pain. Neck supple.  Cardiovascular: Normal rate, regular rhythm and normal heart sounds.  Exam reveals no gallop and no friction rub.   No murmur heard. Pulmonary/Chest: Effort normal and breath sounds normal. No respiratory distress. She has no wheezes. She has no rhonchi. She has no rales. She exhibits no tenderness and no crepitus.  Abdominal: Soft. Normal appearance and bowel sounds are normal. She exhibits no distension. There is tenderness. There is no rebound and no guarding.    Musculoskeletal: Normal range of motion. She exhibits no edema and no tenderness.  Moves all extremities well.   Neurological: She is alert and oriented to person, place, and time. She has normal strength. No cranial nerve deficit.  Skin: Skin is warm, dry and intact. No rash noted. No erythema. No pallor.  Psychiatric: She has a normal mood and affect. Her speech is normal and behavior is normal. Her mood appears not anxious.    ED Course  Procedures (including critical care time)  Medications  HYDROmorphone (DILAUDID) injection 1 mg (not administered)  morphine 4 MG/ML injection 4 mg (4 mg Intravenous Given 01/29/14 1516)  ondansetron (ZOFRAN) injection 4 mg (4 mg Intravenous Given 01/29/14 1516)  morphine 4 MG/ML injection 4 mg (4 mg Intravenous Given 01/29/14 1757)  ondansetron (ZOFRAN) injection 4 mg (4 mg Intravenous Given 01/29/14 1757)  iohexol (OMNIPAQUE) 300 MG/ML solution 25 mL (25 mLs Oral Contrast Given 01/29/14 1933)  iohexol (OMNIPAQUE) 300 MG/ML solution 100 mL (100 mLs Intravenous Contrast Given 01/29/14 2017)  morphine 4 MG/ML injection 4 mg (4 mg Intravenous Given  01/29/14 2044)   Pt required several doses of morphine with improvement of pain but no resolution.   Pt and daughter given CT results. Their concern is that her father and all her 12 siblings have had some type of cancer. Pt has not had relief of her pain. Is agreeable to  admission.   21:22 Dr Alcario Drought admit to med-surg, observation   Labs Review Results for orders placed during the hospital encounter of 01/29/14  CBC      Result Value Ref Range   WBC 10.9 (*) 4.0 - 10.5 K/uL   RBC 4.71  3.87 - 5.11 MIL/uL   Hemoglobin 12.2  12.0 - 15.0 g/dL   HCT 37.7  36.0 - 46.0 %   MCV 80.0  78.0 - 100.0 fL   MCH 25.9 (*) 26.0 - 34.0 pg   MCHC 32.4  30.0 - 36.0 g/dL   RDW 15.3  11.5 - 15.5 %   Platelets 180  150 - 400 K/uL  BASIC METABOLIC PANEL      Result Value Ref Range   Sodium 144  137 - 147 mEq/L   Potassium 4.2  3.7 - 5.3 mEq/L   Chloride 102  96 - 112 mEq/L   CO2 24  19 - 32 mEq/L   Glucose, Bld 147 (*) 70 - 99 mg/dL   BUN 28 (*) 6 - 23 mg/dL   Creatinine, Ser 1.53 (*) 0.50 - 1.10 mg/dL   Calcium 9.5  8.4 - 10.5 mg/dL   GFR calc non Af Amer 33 (*) >90 mL/min   GFR calc Af Amer 38 (*) >90 mL/min  HEPATIC FUNCTION PANEL      Result Value Ref Range   Total Protein 8.2  6.0 - 8.3 g/dL   Albumin 4.2  3.5 - 5.2 g/dL   AST 25  0 - 37 U/L   ALT 14  0 - 35 U/L   Alkaline Phosphatase 177 (*) 39 - 117 U/L   Total Bilirubin 0.2 (*) 0.3 - 1.2 mg/dL   Bilirubin, Direct <0.2  0.0 - 0.3 mg/dL   Indirect Bilirubin NOT CALCULATED  0.3 - 0.9 mg/dL  LIPASE, BLOOD      Result Value Ref Range   Lipase 61 (*) 11 - 59 U/L  I-STAT TROPOININ, ED      Result Value Ref Range   Troponin i, poc 0.00  0.00 - 0.08 ng/mL   Comment 3            Laboratory interpretation all normal except mild elevation of lipase    Imaging Review Dg Chest 2 View  01/29/2014   CLINICAL DATA:  Abdominal pain  EXAM: CHEST  2 VIEW  COMPARISON:  None  FINDINGS: The heart size and mediastinal contours appear normal. There  is no pleural effusion or edema. No airspace consolidation identified. Degenerative mild to moderate degenerative disc disease is noted within the lower thoracic spine.  IMPRESSION: No active cardiopulmonary disease.   Electronically Signed   By: Kerby Moors M.D.   On: 01/29/2014 17:06   US Abdomen Complete  01/29/2014   CLINICAL DATA:  Epigastric abdominal pain. Current history of diabetes and hypertension. Prior history of urinary tract calculi.  EXAM: ULTRASOUND ABDOMEN COMPLETE  COMPARISON:  CT abdomen and pelvis 02/13/2007.  FINDINGS: Gallbladder:  No shadowing gallstones or echogenic sludge. No gallbladder wall thickening or pericholecystic fluid. Negative sonographic Murphy sign according to the ultrasound technologist.  Common bile duct:  Diameter: Approximately 4 mm diameter.  Liver:  Normal size and echotexture without focal parenchymal abnormality. Patent portal vein.  IVC:  Patent.  Pancreas:  Thickened, heterogeneous head and proximal body. Distal body and tail obscured by overlying bowel gas.  Spleen:  Borderline enlarged measuring approximately 13.1 x 5.1 x 12.6 cm, yielding a volume of approximately  420 cc. No focal splenic parenchymal abnormality.  Right Kidney:  Length: Approximately 10.7 cm. Mild diffuse cortical thinning consistent with age. No hydronephrosis. No focal parenchymal abnormality. No visible shadowing calculi.  Left Kidney:  Length: Approximately 11.3 cm. Mild cortical thinning involving the upper pole with well-preserved cortex elsewhere. No hydronephrosis. No focal parenchymal abnormality. No visible shadowing calculi.  Abdominal aorta:  Normal in caliber throughout its visualized course in the abdomen with evidence of atherosclerosis. Obscured distally by overlying bowel gas. Maximum diameter 2.4 cm.  Other findings:  None.  IMPRESSION: 1. Thickened, heterogeneous pancreatic head and proximal body. Is there clinical evidence of pancreatitis? Distal body and tail were  obscured by overlying bowel gas. 2. Borderline splenomegaly without focal splenic parenchymal abnormality. 3. Normal-appearing gallbladder.  No biliary ductal dilation.   Electronically Signed   By: Evangeline Dakin M.D.   On: 01/29/2014 16:46   Ct Abdomen Pelvis W Contrast  01/29/2014   CLINICAL DATA:  Abdominal and pelvic pain with nausea.  EXAM: CT ABDOMEN AND PELVIS WITH CONTRAST  TECHNIQUE: Multidetector CT imaging of the abdomen and pelvis was performed using the standard protocol following bolus administration of intravenous contrast.  CONTRAST:  148mL OMNIPAQUE IOHEXOL 300 MG/ML  SOLN  COMPARISON:  02/13/2007 CT and 01/29/2014 ultrasound  FINDINGS: The liver has a slightly nodular contour suspicious for cirrhosis.  The spleen is upper limits of normal in size.  Mild to moderate bilateral renal cortical thinning is noted.  Adrenal glands are unremarkable except for at a stable small left adrenal myelolipoma.  There has been interval development of a 1.1 x 1.5 cm cystic structure within the pancreatic head (image number 33). There is no evidence of pancreatic ductal dilatation or the pancreatic abnormality.  There is no evidence of abdominal aortic aneurysm, biliary dilatation or worrisome enlarged lymph nodes.  There are few upper normal caliber small bowel loops within the mid abdomen containing gas and some fluid - nonspecific. There is no evidence of bowel obstruction, pneumoperitoneum, or abscess.  Remainder of the bowel and appendix are unremarkable.  The bladder is within normal limits.  The patient is status post hysterectomy.  No acute or suspicious bony abnormalities are noted. Degenerative changes within the lumbar spine are again noted. Marland Kitchen  IMPRESSION: Nonspecific nonobstructive bowel gas pattern with upper limits of normal caliber gas and fluid filled small bowel in the mid abdomen. This probably represents a mild ileus.  1.1 x 1.5 cm pancreatic head cystic structure - new since 2008. MR  followup in 1 year is recommended to evaluate stability. This may represent an indolent cystic lesion or IPMN.  Probable cirrhosis.  Upper limits of normal spleen size.   Electronically Signed   By: [name] M.D.   On: 01/29/2014 20:49    EKG Interpretation    Date/Time:  Sunday January 29 2014 14:05:22 EST Ventricular Rate:  101 PR Interval:  208 QRS Duration: 84 QT Interval:  346 QTC Calculation: 448 R Axis:   94 Text Interpretation:  Sinus tachycardia Rightward axis Low voltage QRS Electrode noise No significant change since last tracing Confirmed by Torina Ey  MD-I, Tj Kitchings ([id number]) on 01/29/2014 2:59:25 PM            MDM   Final diagnoses:  Epigastric abdominal pain  Inadequate pain control  Nausea  Pancreatic cyst  Pancreatitis     Plan admission   Rolland Porter, MD, Alanson Aly, MD 01/29/14 2129

## 2014-01-29 NOTE — ED Notes (Signed)
Pt returned from CT scan and placed back on monitor; updated on POC. Requesting more pain medicine. MD gave verbal order for repeat.

## 2014-01-30 ENCOUNTER — Encounter (HOSPITAL_COMMUNITY): Payer: Self-pay | Admitting: *Deleted

## 2014-01-30 ENCOUNTER — Ambulatory Visit: Payer: Medicare Other | Admitting: Internal Medicine

## 2014-01-30 DIAGNOSIS — R1013 Epigastric pain: Secondary | ICD-10-CM | POA: Diagnosis present

## 2014-01-30 DIAGNOSIS — K859 Acute pancreatitis without necrosis or infection, unspecified: Secondary | ICD-10-CM | POA: Diagnosis not present

## 2014-01-30 DIAGNOSIS — R933 Abnormal findings on diagnostic imaging of other parts of digestive tract: Secondary | ICD-10-CM | POA: Diagnosis not present

## 2014-01-30 DIAGNOSIS — K862 Cyst of pancreas: Secondary | ICD-10-CM | POA: Diagnosis not present

## 2014-01-30 DIAGNOSIS — E119 Type 2 diabetes mellitus without complications: Secondary | ICD-10-CM

## 2014-01-30 DIAGNOSIS — N289 Disorder of kidney and ureter, unspecified: Secondary | ICD-10-CM

## 2014-01-30 DIAGNOSIS — K869 Disease of pancreas, unspecified: Secondary | ICD-10-CM | POA: Diagnosis not present

## 2014-01-30 LAB — COMPREHENSIVE METABOLIC PANEL
ALT: 9 U/L (ref 0–35)
AST: 16 U/L (ref 0–37)
Albumin: 3.5 g/dL (ref 3.5–5.2)
Alkaline Phosphatase: 166 U/L — ABNORMAL HIGH (ref 39–117)
BUN: 25 mg/dL — ABNORMAL HIGH (ref 6–23)
CALCIUM: 8.8 mg/dL (ref 8.4–10.5)
CO2: 27 mEq/L (ref 19–32)
CREATININE: 1.57 mg/dL — AB (ref 0.50–1.10)
Chloride: 104 mEq/L (ref 96–112)
GFR calc Af Amer: 37 mL/min — ABNORMAL LOW (ref >90)
GFR calc non Af Amer: 32 mL/min — ABNORMAL LOW (ref >90)
GLUCOSE: 114 mg/dL — AB (ref 70–99)
Potassium: 4.5 mEq/L (ref 3.7–5.3)
SODIUM: 143 meq/L (ref 137–147)
Total Bilirubin: 0.2 mg/dL — ABNORMAL LOW (ref 0.3–1.2)
Total Protein: 6.8 g/dL (ref 6.0–8.3)

## 2014-01-30 LAB — GLUCOSE, CAPILLARY
GLUCOSE-CAPILLARY: 122 mg/dL — AB (ref 70–99)
GLUCOSE-CAPILLARY: 103 mg/dL — AB (ref 70–99)
Glucose-Capillary: 106 mg/dL — ABNORMAL HIGH (ref 70–99)
Glucose-Capillary: 108 mg/dL — ABNORMAL HIGH (ref 70–99)
Glucose-Capillary: 163 mg/dL — ABNORMAL HIGH (ref 70–99)
Glucose-Capillary: 92 mg/dL (ref 70–99)

## 2014-01-30 LAB — CBC
HCT: 32.3 % — ABNORMAL LOW (ref 36.0–46.0)
HEMOGLOBIN: 10.3 g/dL — AB (ref 12.0–15.0)
MCH: 25.7 pg — AB (ref 26.0–34.0)
MCHC: 31.9 g/dL (ref 30.0–36.0)
MCV: 80.5 fL (ref 78.0–100.0)
Platelets: 133 10*3/uL — ABNORMAL LOW (ref 150–400)
RBC: 4.01 MIL/uL (ref 3.87–5.11)
RDW: 15.6 % — ABNORMAL HIGH (ref 11.5–15.5)
WBC: 7.4 10*3/uL (ref 4.0–10.5)

## 2014-01-30 LAB — HEPATITIS B SURFACE ANTIGEN: Hepatitis B Surface Ag: NEGATIVE

## 2014-01-30 LAB — HEPATITIS B SURFACE ANTIBODY,QUALITATIVE: Hep B S Ab: NEGATIVE

## 2014-01-30 MED ORDER — ONDANSETRON HCL 4 MG/2ML IJ SOLN: 4.0000 mg | Freq: Three times a day (TID) | INTRAMUSCULAR | Status: AC | PRN

## 2014-01-30 MED ORDER — HYDROMORPHONE HCL PF 1 MG/ML IJ SOLN
1.0000 mg | INTRAMUSCULAR | Status: AC | PRN
  Administered 2014-01-30: 1 mg via INTRAVENOUS
  Filled 2014-01-30: qty 1

## 2014-01-30 MED ORDER — HYDRALAZINE HCL 10 MG PO TABS
10.0000 mg | ORAL_TABLET | Freq: Four times a day (QID) | ORAL | Status: DC | PRN
  Filled 2014-01-30: qty 1

## 2014-01-30 MED ORDER — SODIUM CHLORIDE 0.9 % IV SOLN: INTRAVENOUS | Status: DC

## 2014-01-30 NOTE — Progress Notes (Signed)
[facility] PROGRESS NOTE  KESHUNA HIGH [id number] DOB: 1941-02-26 DOA: 01/29/2014 PCP: [name], MD  Assessment/Plan: Principal Problem:   Pancreatitis: Lipase minimally elevated, resolved today. Have started clear liquids. See below.  Appreciate GIs help, this could be related to her medicine, Victoza  Active Problems:   DIABETES MELLITUS, TYPE II: Until on solid food, every 4 hours sliding scale    HYPERTENSION: Blood pressure slightly elevated. Continue home meds with when necessary hydralazine    Chronic renal insufficiency, stage II (mild): Stable. Recheck labs in the morning. Gentle hydration.    Lesion of pancreas: Checking CA 19-9 level. 4 MRCP tomorrow.  Code Status: Full code Family Communication: Spoke w/husband by phone Disposition Plan: Check MRCP, likely home tomorrow   Consultants:  [facility]  Procedures:  MRCP tomorrow  Antibiotics:  None  HPI/Subjective: Patient feeling better. No acute abdominal pain. About to start clear liquids  Objective: Filed Vitals:   01/30/14 1456  BP: 153/56  Pulse: 83  Temp: 98.6 F (37 C)  Resp: 18    Intake/Output Summary (Last 24 hours) at 01/30/14 1522 Last data filed at 01/30/14 1400  Gross per 24 hour  Intake 2072.92 ml  Output      0 ml  Net 2072.92 ml   Filed Weights   01/30/14 0513  Weight: 81.647 kg (180 lb)    Exam:   General:  Alert and oriented x3, no acute distress  Cardiovascular: Regular rate and rhythm, S1-S2  Respiratory: Clear auscultation bilaterally  Abdomen: Soft, nontender, nondistended, positive bowel sounds  Musculoskeletal: No clubbing or cyanosis, trace edema   Data Reviewed: Basic Metabolic Panel:  Recent Labs Lab 01/29/14 1430 01/30/14 0500  NA 144 143  K 4.2 4.5  CL 102 104  CO2 24 27  GLUCOSE 147* 114*  BUN 28* 25*  CREATININE 1.53* 1.57*  CALCIUM 9.5 8.8   Liver Function Tests:  Recent Labs Lab 01/29/14 1430 01/30/14 0500   AST 25 16  ALT 14 9  ALKPHOS 177* 166*  BILITOT 0.2* 0.2*  PROT 8.2 6.8  ALBUMIN 4.2 3.5    Recent Labs Lab 01/29/14 1430  LIPASE 61*   No results found for this basename: AMMONIA,  in the last 168 hours CBC:  Recent Labs Lab 01/29/14 1430 01/30/14 0500  WBC 10.9* 7.4  HGB 12.2 10.3*  HCT 37.7 32.3*  MCV 80.0 80.5  PLT 180 133*   Cardiac Enzymes: No results found for this basename: CKTOTAL, CKMB, CKMBINDEX, TROPONINI,  in the last 168 hours BNP (last 3 results) No results found for this basename: PROBNP,  in the last 8760 hours CBG:  Recent Labs Lab 01/29/14 2201 01/29/14 2354 01/30/14 0401 01/30/14 0753 01/30/14 1140  GLUCAP 123* 137* 122* 106* 108*    No results found for this or any previous visit (from the past 240 hour(s)).   Studies: Dg Chest 2 View  01/29/2014   CLINICAL DATA:  Abdominal pain  EXAM: CHEST  2 VIEW  COMPARISON:  None  FINDINGS: The heart size and mediastinal contours appear normal. There is no pleural effusion or edema. No airspace consolidation identified. Degenerative mild to moderate degenerative disc disease is noted within the lower thoracic spine.  IMPRESSION: No active cardiopulmonary disease.   Electronically Signed   By: Kerby Moors M.D.   On: 01/29/2014 17:06   US Abdomen Complete  01/29/2014   CLINICAL DATA:  Epigastric abdominal pain. Current history of diabetes and hypertension. Prior history of urinary  tract calculi.  EXAM: ULTRASOUND ABDOMEN COMPLETE  COMPARISON:  CT abdomen and pelvis 02/13/2007.  FINDINGS: Gallbladder:  No shadowing gallstones or echogenic sludge. No gallbladder wall thickening or pericholecystic fluid. Negative sonographic Murphy sign according to the ultrasound technologist.  Common bile duct:  Diameter: Approximately 4 mm diameter.  Liver:  Normal size and echotexture without focal parenchymal abnormality. Patent portal vein.  IVC:  Patent.  Pancreas:  Thickened, heterogeneous head and proximal body.  Distal body and tail obscured by overlying bowel gas.  Spleen:  Borderline enlarged measuring approximately 13.1 x 5.1 x 12.6 cm, yielding a volume of approximately 420 cc. No focal splenic parenchymal abnormality.  Right Kidney:  Length: Approximately 10.7 cm. Mild diffuse cortical thinning consistent with age. No hydronephrosis. No focal parenchymal abnormality. No visible shadowing calculi.  Left Kidney:  Length: Approximately 11.3 cm. Mild cortical thinning involving the upper pole with well-preserved cortex elsewhere. No hydronephrosis. No focal parenchymal abnormality. No visible shadowing calculi.  Abdominal aorta:  Normal in caliber throughout its visualized course in the abdomen with evidence of atherosclerosis. Obscured distally by overlying bowel gas. Maximum diameter 2.4 cm.  Other findings:  None.  IMPRESSION: 1. Thickened, heterogeneous pancreatic head and proximal body. Is there clinical evidence of pancreatitis? Distal body and tail were obscured by overlying bowel gas. 2. Borderline splenomegaly without focal splenic parenchymal abnormality. 3. Normal-appearing gallbladder.  No biliary ductal dilation.   Electronically Signed   By: Evangeline Dakin M.D.   On: 01/29/2014 16:46   Ct Abdomen Pelvis W Contrast  01/29/2014   CLINICAL DATA:  Abdominal and pelvic pain with nausea.  EXAM: CT ABDOMEN AND PELVIS WITH CONTRAST  TECHNIQUE: Multidetector CT imaging of the abdomen and pelvis was performed using the standard protocol following bolus administration of intravenous contrast.  CONTRAST:  133mL OMNIPAQUE IOHEXOL 300 MG/ML  SOLN  COMPARISON:  02/13/2007 CT and 01/29/2014 ultrasound  FINDINGS: The liver has a slightly nodular contour suspicious for cirrhosis.  The spleen is upper limits of normal in size.  Mild to moderate bilateral renal cortical thinning is noted.  Adrenal glands are unremarkable except for at a stable small left adrenal myelolipoma.  There has been interval development of a 1.1 x  1.5 cm cystic structure within the pancreatic head (image number 33). There is no evidence of pancreatic ductal dilatation or the pancreatic abnormality.  There is no evidence of abdominal aortic aneurysm, biliary dilatation or worrisome enlarged lymph nodes.  There are few upper normal caliber small bowel loops within the mid abdomen containing gas and some fluid - nonspecific. There is no evidence of bowel obstruction, pneumoperitoneum, or abscess.  Remainder of the bowel and appendix are unremarkable.  The bladder is within normal limits.  The patient is status post hysterectomy.  No acute or suspicious bony abnormalities are noted. Degenerative changes within the lumbar spine are again noted. Marland Kitchen  IMPRESSION: Nonspecific nonobstructive bowel gas pattern with upper limits of normal caliber gas and fluid filled small bowel in the mid abdomen. This probably represents a mild ileus.  1.1 x 1.5 cm pancreatic head cystic structure - new since 2008. MR followup in 1 year is recommended to evaluate stability. This may represent an indolent cystic lesion or IPMN.  Probable cirrhosis.  Upper limits of normal spleen size.   Electronically Signed   By: [name] M.D.   On: 01/29/2014 20:49    Scheduled Meds: . amLODipine  5 mg Oral Daily  . aspirin  325 mg  Oral Daily  . heparin  5,000 Units Subcutaneous 3 times per day  . insulin aspart  0-15 Units Subcutaneous 6 times per day  . meloxicam  7.5 mg Oral Daily  . simvastatin  20 mg Oral QPM   Continuous Infusions: . sodium chloride 125 mL/hr at 01/30/14 [id number]    Principal Problem:   Pancreatitis Active Problems:   DIABETES MELLITUS, TYPE II   HYPERTENSION   Chronic renal insufficiency, stage II (mild)   Lesion of pancreas    Time spent: 25 minutes    [facility] Pager [phone]. If 7PM-7AM, please contact night-coverage at [email or web address], password [facility] 01/30/2014, 3:22 PM  LOS: 1 day

## 2014-01-30 NOTE — Consult Note (Addendum)
[facility] Consult: 2:51 PM 01/30/2014  LOS: 1 day    Referring Provider: Dr Gevena Barre  Primary Care Physician:  [name], MD Primary Gastroenterologist:  Dr. Scarlette Shorts.      Reason for Consultation:  Cystic lesion in head of pancreas   HPI: [name] is a 73 y.o. female.   Known to Dr Henrene Pastor for adenomatous polyps on 2011 screening colonoscopy.  Admitted last PM with epigastric pain, radiating to back, pressure-like quality, severity 8-9/10.  Worse when laying down.  No nausea. Pain started 6 AM yesterday and not relived by passing flatus, oral narcotic, antacid, belching.  No previous similar pain. No anorexia or weight loss.  Lipase 61, alk phos 177 but otherwise normal LFTs.  CT scan confirms cystic lesion in HOP (head of pancreas).  Ducts not dilated.  Incidental finding of nodular liver.  No hx of ETOH abuse, jaundice.  Alk phos dating to 2008 is mildly elevated to 70s and 80s.  No pale stools, no pruritus.  Only newish med is Victoza, Dr Raliegh Ip has provided her with samples of this in last few months.   Today she is pain free and just started on clears which she is so far tolerating.      Past Medical History  Diagnosis Date  . DIABETES MELLITUS, TYPE II 07/13/2007  . HYPERLIPIDEMIA 12/24/2007  . HYPERTENSION 07/13/2007  . CORONARY ARTERY DISEASE 12/24/2007  . NEPHROLITHIASIS, HX OF 12/24/2007  . Tubular adenoma of colon 04/2010    on colonoscopy by Scarlette Shorts MD    Past Surgical History  Procedure Laterality Date  . Excisional hemorrhoidectomy  unknown  . Abdominal hysterectomy  unknown  . Nasal sinus surgery  unknown  . Cardiac catheterization  2002  . Cataract extraction Bilateral     Prior to Admission medications   Medication Sig Start Date End Date Taking?  Authorizing Provider  amLODipine (NORVASC) 5 MG tablet Take 5 mg by mouth daily.   Yes Historical Provider, MD  aspirin 325 MG tablet Take 325 mg by mouth daily.    Yes Historical Provider, MD  benazepril (LOTENSIN) 40 MG tablet Take 40 mg by mouth daily.   Yes Historical Provider, MD  furosemide (LASIX) 40 MG tablet Take 40 mg by mouth daily.   Yes Historical Provider, MD  glimepiride (AMARYL) 4 MG tablet Take 4 mg by mouth daily before breakfast.   Yes Historical Provider, MD  HYDROcodone-acetaminophen (NORCO/VICODIN) 5-325 MG per tablet Take 1 tablet by mouth every 6 (six) hours as needed for pain. 05/10/13  Yes Elwyn Lade, PA-C  LORazepam (ATIVAN) 0.5 MG tablet Take 1 tablet (0.5 mg total) by mouth every 6 (six) hours as needed for anxiety. 07/04/13  Yes Marletta Lor, MD  meloxicam (MOBIC) 7.5 MG tablet Take 1 tablet (7.5 mg total) by mouth daily. 08/26/13  Yes Marletta Lor, MD  metFORMIN (GLUCOPHAGE) 1000 MG tablet Take 1 tablet (1,000 mg total) by mouth 2 (two) times daily with a meal. 01/20/14  Yes Marletta Lor, MD  promethazine (PHENERGAN) 12.5 MG tablet Take 2 tablets (25 mg total) by mouth every 6 (six) hours as needed for nausea. 05/10/13  Yes Elwyn Lade, PA-C  simvastatin (ZOCOR) 20 MG tablet Take 20 mg by mouth every evening.   Yes Historical Provider, MD  Victoza                                    Dose unknown.           Once daily  Scheduled Meds: . amLODipine  5 mg Oral Daily  . aspirin  325 mg Oral Daily  . heparin  5,000 Units Subcutaneous 3 times per day  . insulin aspart  0-15 Units Subcutaneous 6 times per day  . meloxicam  7.5 mg Oral Daily  . simvastatin  20 mg Oral QPM   Infusions: . sodium chloride    . sodium chloride 125 mL/hr at 01/30/14 0821   PRN Meds: HYDROmorphone (DILAUDID) injection, LORazepam, ondansetron (ZOFRAN) IV, promethazine   Allergies as of 01/29/2014  . (No Known Allergies)    Family History  Problem Relation Age  of Onset  . Cancer Father     throat ca  . COPD Father   . Cancer Sister     uterine ca and breast ca  . Cancer Brother     throat    History   Social History  . Marital Status: Married    Spouse Name: N/A    Number of Children: N/A  . Years of Education: N/A   Occupational History  . Not on file.   Social History Main Topics  . Smoking status: Former Smoker    Types: Cigarettes    Quit date: 12/08/2000  . Smokeless tobacco: Never Used  . Alcohol Use: No  . Drug Use: No  . Sexual Activity: Not on file   Other Topics Concern  . Not on file   Social History Narrative  . No narrative on file    REVIEW OF SYSTEMS: Constitutional:  Stable weight, no weakness ENT:  No nose bleeds.  + full dentures Pulm:  No PND, no cough, no exertional dyspnea except with stair climbing CV:  No palpitations, no LE edema.  GU:  No hematuria, no frequency GI:  Per HPI.  No heartburn, no dysphagia.  No BPR Heme:  No iron use in past   Transfusions:  None  Neuro:  No headaches, no peripheral tingling or numbness Derm:  No itching, no rash or sores.  Endocrine:  No sweats or chills.  No polyuria or dysuria Immunization:  Flu, Td, pneumovax up todate Travel:  None beyond local counties in last few months.    PHYSICAL EXAM: Vital signs in last 24 hours: Filed Vitals:   01/30/14 0513  BP:   Pulse: 83  Temp: 98.2 F (36.8 C)  Resp: 19   Wt Readings from Last 3 Encounters:  01/30/14 81.647 kg (180 lb)  10/31/13 86.183 kg (190 lb)  07/21/13 89.812 kg (198 lb)    General: overweight, somewhat chronically unwell looking WF.  Comfortable.  Not acutely ill Head:  No asymmetry or swelling  Eyes:  No pallor or icterus Ears:  Not HOH  Nose:  No congestion or discharge Mouth:  Clear, moist, edentulous Neck:  No mass, JVD, or bruits Lungs:  Clear but overall diminished.  No cough or dyspnea Heart: RRR no MRG Abdomen:  Soft, NT,  no mass or HSM.  No caput medusa, no hernia.  BS  hypoactive. .   Rectal: deferred   Musc/Skeltl: no joint contractures Extremities:  No pedal edema  Neurologic:  No tremor, no asterixis,.  Oriented x 3.   Skin:  No rash or sores, no telangectasia Tattoos:  none Nodes:  No cervical adenopathy   Psych:  Pleasant, not depressed or anxious. Cooperative.   Intake/Output from previous day: 02/22 0701 - 02/23 0700 In: 947.9 [P.O.:200; I.V.:747.9] Out: -  Intake/Output this shift: Total I/O In: 1125 [I.V.:1125] Out: -   LAB RESULTS:  Recent Labs  01/29/14 1430 01/30/14 0500  WBC 10.9* 7.4  HGB 12.2 10.3*  HCT 37.7 32.3*  PLT 180 133*  MCV    80   BMET Lab Results  Component Value Date   NA 143 01/30/2014   NA 144 01/29/2014   NA 138 09/03/2012   K 4.5 01/30/2014   K 4.2 01/29/2014   K 4.3 09/03/2012   CL 104 01/30/2014   CL 102 01/29/2014   CL 103 09/03/2012   CO2 27 01/30/2014   CO2 24 01/29/2014   CO2 27 09/03/2012   GLUCOSE 114* 01/30/2014   GLUCOSE 147* 01/29/2014   GLUCOSE 227* 09/03/2012   BUN 25* 01/30/2014   BUN 28* 01/29/2014   BUN 20 09/03/2012   CREATININE 1.57* 01/30/2014   CREATININE 1.53* 01/29/2014   CREATININE 1.2 09/03/2012   CALCIUM 8.8 01/30/2014   CALCIUM 9.5 01/29/2014   CALCIUM 8.7 09/03/2012   LFT  Recent Labs  01/29/14 1430 01/30/14 0500  PROT 8.2 6.8  ALBUMIN 4.2 3.5  AST 25 16  ALT 14 9  ALKPHOS 177* 166*  BILITOT 0.2* 0.2*  BILIDIR <0.2  --   IBILI NOT CALCULATED  --    PT/INR No results found for this basename: INR,  PROTIME   Hepatitis Panel No results found for this basename: HEPBSAG, HCVAB, HEPAIGM, HEPBIGM,  in the last 72 hours Lipase     Component Value Date/Time   LIPASE 61* 01/29/2014 1430     RADIOLOGY STUDIES: Dg Chest 2 View 01/29/2014  COMPARISON:  None  FINDINGS: The heart size and mediastinal contours appear normal. There is no pleural effusion or edema. No airspace consolidation identified. Degenerative mild to moderate degenerative disc disease is noted within the  lower thoracic spine.  IMPRESSION: No active cardiopulmonary disease.   Electronically Signed   By: Kerby Moors M.D.   On: 01/29/2014 17:06   US Abdomen Complete 01/29/2014    COMPARISON:  CT abdomen and pelvis 02/13/2007.  FINDINGS: Gallbladder:  No shadowing gallstones or echogenic sludge. No gallbladder wall thickening or pericholecystic fluid. Negative sonographic Murphy sign according to the ultrasound technologist.  Common bile duct:  Diameter: Approximately 4 mm diameter.  Liver:  Normal size and echotexture without focal parenchymal abnormality. Patent portal vein.  IVC:  Patent.  Pancreas:  Thickened, heterogeneous head and proximal body. Distal body and tail obscured by overlying bowel gas.  Spleen:  Borderline enlarged measuring approximately 13.1 x 5.1 x 12.6 cm, yielding a volume of approximately 420 cc. No focal splenic parenchymal abnormality.  Right Kidney:  Length: Approximately 10.7 cm. Mild diffuse cortical thinning consistent with age. No hydronephrosis. No focal parenchymal abnormality. No visible shadowing calculi.  Left Kidney:  Length: Approximately 11.3 cm. Mild cortical thinning involving the upper pole with well-preserved cortex elsewhere. No hydronephrosis. No focal parenchymal abnormality. No visible shadowing calculi.  Abdominal aorta:  Normal in  caliber throughout its visualized course in the abdomen with evidence of atherosclerosis. Obscured distally by overlying bowel gas. Maximum diameter 2.4 cm.  Other findings:  None.  IMPRESSION: 1. Thickened, heterogeneous pancreatic head and proximal body. Is there clinical evidence of pancreatitis? Distal body and tail were obscured by overlying bowel gas. 2. Borderline splenomegaly without focal splenic parenchymal abnormality. 3. Normal-appearing gallbladder.  No biliary ductal dilation.   Electronically Signed   By: Evangeline Dakin M.D.   On: 01/29/2014 16:46   Ct Abdomen Pelvis W Contrast 01/29/2014     COMPARISON:  02/13/2007 CT  and 01/29/2014 ultrasound  FINDINGS: The liver has a slightly nodular contour suspicious for cirrhosis.  The spleen is upper limits of normal in size.  Mild to moderate bilateral renal cortical thinning is noted.  Adrenal glands are unremarkable except for at a stable small left adrenal myelolipoma.  There has been interval development of a 1.1 x 1.5 cm cystic structure within the pancreatic head (image number 33). There is no evidence of pancreatic ductal dilatation or the pancreatic abnormality.  There is no evidence of abdominal aortic aneurysm, biliary dilatation or worrisome enlarged lymph nodes.  There are few upper normal caliber small bowel loops within the mid abdomen containing gas and some fluid - nonspecific. There is no evidence of bowel obstruction, pneumoperitoneum, or abscess.  Remainder of the bowel and appendix are unremarkable.  The bladder is within normal limits.  The patient is status post hysterectomy.  No acute or suspicious bony abnormalities are noted. Degenerative changes within the lumbar spine are again noted. Marland Kitchen  IMPRESSION: Nonspecific nonobstructive bowel gas pattern with upper limits of normal caliber gas and fluid filled small bowel in the mid abdomen. This probably represents a mild ileus.  1.1 x 1.5 cm pancreatic head cystic structure - new since 2008. MR followup in 1 year is recommended to evaluate stability. This may represent an indolent cystic lesion or IPMN.  Probable cirrhosis.  Upper limits of normal spleen size.   Electronically Signed   By: [name] M.D.   On: 01/29/2014 20:49    ENDOSCOPIC STUDIES: 04/2010  Colonoscopy, screening study ENDOSCOPIC IMPRESSION:  1) Two polyps in the ascending colon -removed  Pathology: COLON, POLYP(S), ASCENDING : TUBULAR ADENOMA AND A SERRATED ADENOMA. NO HIGH GRADE DYSPLASIA OR MALIGNANCY IDENTIFIED. 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 .  Mild elevation of lipase with now resolved epigastric pain, rule out mild pancreatitis. No antecedent sxs or history to suggest previous pancreatitis.  *  Probable cirrhosis of liver per CT scan. Suspect due to fatty liver but need to rule out viral and autoimmune causes.  *  Borderline splenomegaly per  *  Hx adenomatous colon polyps 04/2010, due repeat colonoscopy 04/2015.  *  Normocytic anemia.  *  Renal insufficiency, CKD *  NIDDM   PLAN:     *  Per Dr Fuller Plan.  May need EUS/ERCP which would need to be set up with Dr Owens Loffler. For now, per Up to Date review:  obtain MRCP (pancreatic protocol CT is an alternative but she has renal dysfunction which is progressive in last 2 years) *  Check hep B and C serologies, ANA, smooth muscle ab, and AMA to rule  out viral and auroimmune liver disease. CA 19-9 is pending. *  Advance to diabetic diet as tolerated *  Need to consider stopping Victoza, which is linked to pancreatitis in drug studies.    Azucena Freed  01/30/2014, 2:51 PM Pager: [phone]      Attending physician's note   I have taken a history, examined the patient and reviewed the chart. I agree with the Advanced Practitioner's note, impression and recommendations.  *Mild pancreatitis, etiology is unclear. Victoza linked to pancreatitis however the patient relates that she has not taken this medication since November. She has been on Invokana daily since November which is not associated with pancreatitis but could potentially be a cause. Furosemide is also a potential cause of pancreatitis. MRI/MRCP to further evaluate. *1.5 cm pancreatic head cystic lesion. MRI/MRCP to further evaluate. Likely will need repeat MRI/MRCP in 6-12 months to assess stability. Outpatient follow up. No plans for EUS or ERCP at this time.  *Nodular hepatic  contour, elevated alk phos and borderline splenomegaly. R/O cirrhosis. Standard hepatic serologies for underlying causes of liver disease and outpatient follow up.   Ladene Artist, MD [facility]

## 2014-01-31 ENCOUNTER — Observation Stay (HOSPITAL_COMMUNITY): Payer: Medicare HMO

## 2014-01-31 DIAGNOSIS — K862 Cyst of pancreas: Secondary | ICD-10-CM | POA: Diagnosis not present

## 2014-01-31 DIAGNOSIS — R1013 Epigastric pain: Secondary | ICD-10-CM | POA: Diagnosis not present

## 2014-01-31 DIAGNOSIS — R933 Abnormal findings on diagnostic imaging of other parts of digestive tract: Secondary | ICD-10-CM | POA: Diagnosis not present

## 2014-01-31 DIAGNOSIS — K863 Pseudocyst of pancreas: Secondary | ICD-10-CM | POA: Diagnosis not present

## 2014-01-31 DIAGNOSIS — I10 Essential (primary) hypertension: Secondary | ICD-10-CM | POA: Diagnosis not present

## 2014-01-31 DIAGNOSIS — K859 Acute pancreatitis without necrosis or infection, unspecified: Secondary | ICD-10-CM | POA: Diagnosis not present

## 2014-01-31 DIAGNOSIS — K7460 Unspecified cirrhosis of liver: Secondary | ICD-10-CM | POA: Diagnosis present

## 2014-01-31 DIAGNOSIS — K869 Disease of pancreas, unspecified: Secondary | ICD-10-CM | POA: Diagnosis not present

## 2014-01-31 LAB — BASIC METABOLIC PANEL
BUN: 17 mg/dL (ref 6–23)
CO2: 24 mEq/L (ref 19–32)
CREATININE: 1.23 mg/dL — AB (ref 0.50–1.10)
Calcium: 9 mg/dL (ref 8.4–10.5)
Chloride: 108 mEq/L (ref 96–112)
GFR, EST AFRICAN AMERICAN: 50 mL/min — AB (ref >90)
GFR, EST NON AFRICAN AMERICAN: 43 mL/min — AB (ref >90)
GLUCOSE: 121 mg/dL — AB (ref 70–99)
Potassium: 5.4 mEq/L — ABNORMAL HIGH (ref 3.7–5.3)
Sodium: 145 mEq/L (ref 137–147)

## 2014-01-31 LAB — GLUCOSE, CAPILLARY
Glucose-Capillary: 111 mg/dL — ABNORMAL HIGH (ref 70–99)
Glucose-Capillary: 117 mg/dL — ABNORMAL HIGH (ref 70–99)
Glucose-Capillary: 106 mg/dL — ABNORMAL HIGH (ref 70–99)
Glucose-Capillary: 133 mg/dL — ABNORMAL HIGH (ref 70–99)

## 2014-01-31 LAB — HEPATITIS C ANTIBODY: HCV Ab: NEGATIVE

## 2014-01-31 LAB — LIPASE, BLOOD: Lipase: 39 U/L (ref 11–59)

## 2014-01-31 LAB — CANCER ANTIGEN 19-9: CA 19 9: 9.7 U/mL — AB (ref <35.0)

## 2014-01-31 MED ORDER — GADOBENATE DIMEGLUMINE 529 MG/ML IV SOLN
20.0000 mL | Freq: Once | INTRAVENOUS | Status: AC
  Administered 2014-01-31: 17 mL via INTRAVENOUS

## 2014-01-31 NOTE — Discharge Summary (Signed)
Physician Discharge Summary  [name] [id number] DOB: [birth year] DOA: 01/29/2014  PCP: [name], MD  Admit date: 01/29/2014 Discharge date: 01/31/2014  Time spent: 25 minutes  Recommendations for Outpatient Follow-up:  1. Patient will follow up with [facility] in the next month  Discharge Diagnoses:  Principal Problem:   Pancreatitis Active Problems:   DIABETES MELLITUS, TYPE II   HYPERTENSION   Chronic renal insufficiency, stage II (mild)   Lesion of pancreas   Nonspecific (abnormal) findings on radiological and other examination of gastrointestinal tract   Abdominal pain, epigastric   Discharge Condition: Proved, being discharged home  Diet recommendation: Carb modified heart healthy  Filed Weights   01/30/14 0513  Weight: 81.647 kg (180 lb)    History of present illness:  Patient is a 73 year old white female past medical history diabetes hypertension who presented on 2/22 with one-day history of midepigastric abdominal pain radiating to her back associated with some nausea. Pain was felt to be relatively constant. On examination in the emergency room, she was found to have minimally elevated lipase level in the 60s. A CT scan and ultrasound noted a new pancreatic head cystic structure had not previously seen since 2008. Patient was made n.p.o. and admitted to the hospitalist service.  Hospital Course:  Principal Problem:   Pancreatitis: By hospital day 2, patient was starting to feel better. She was put on clear liquids and tolerating this well. An MRCP didn't deep confirming nonobstructing pancreatic cystic lesion, but it was unclear whether or not the lesion had caused the pancreatitis. Post MRCP, patient's diet was advanced and currently she is tolerating a low sodium solid food diet without incident. She's up to be stable to be discharged home and will followup with GI as outpatient.  Active Problems:   DIABETES MELLITUS, TYPE II: Stable. Initially  she was placed on a every 4 hour sliding-scale only while n.p.o. She'll resume all of her home meds upon discharge    HYPERTENSION: Stable. Patient will continue home meds.    Chronic renal insufficiency, stage II (mild): Stable. Chronic conjunctival 0.57, with hydration by day of discharge. 0.23.    Lesion of pancreas: After being evaluated by GI, patient underwent MRCP on 2/24. Patient was noted of the cystic lesion in the head of the pancreas which may will communicate with the pancreatic duct. Differential considerations included postinflammatory cystic change versus an intraductal papilloma medullary tumor. Followup in one year was recommended. Patient will followup GI. Incidental tumor marker CA 19-9 level was normal at 9.7.    Nonspecific (abnormal) findings on radiological and other examination of gastrointestinal tract: As described above see my initial ultrasound.    Abdominal pain, epigastric: Secondary to resolving pancreatitis.  ? Early cirrhosis: No evidence of architectural distortion or portal hypertension was noted. Incidentally noted on MRCP. Patient underwent hepatitis panel which was negative. Rest of workup will be done as outpatient by GI  Procedures:  None  Consultations:  [facility]  Discharge Exam: Filed Vitals:   01/31/14 1500  BP: 166/80  Pulse: 88  Temp: 98.2 F (36.8 C)  Resp: 18    General: Alert and oriented x3, no acute distress, tolerating by mouth Cardiovascular: Regular rate and rhythm, S1-S2 Respiratory: Clear to auscultation bilaterally Abdomen: Soft, nontender, nondistended, positive bowel sounds  Discharge Instructions  Discharge Orders   Future Orders Complete By Expires   Diet - low sodium heart healthy  As directed    Diet Carb Modified  As directed  Increase activity slowly  As directed        Medication List         amLODipine 5 MG tablet  Commonly known as:  NORVASC  Take 5 mg by mouth daily.      aspirin 325 MG tablet  Take 325 mg by mouth daily.     benazepril 40 MG tablet  Commonly known as:  LOTENSIN  Take 40 mg by mouth daily.     furosemide 40 MG tablet  Commonly known as:  LASIX  Take 40 mg by mouth daily.     glimepiride 4 MG tablet  Commonly known as:  AMARYL  Take 4 mg by mouth daily before breakfast.     HYDROcodone-acetaminophen 5-325 MG per tablet  Commonly known as:  NORCO/VICODIN  Take 1 tablet by mouth every 6 (six) hours as needed for pain.     LORazepam 0.5 MG tablet  Commonly known as:  ATIVAN  Take 1 tablet (0.5 mg total) by mouth every 6 (six) hours as needed for anxiety.     meloxicam 7.5 MG tablet  Commonly known as:  MOBIC  Take 1 tablet (7.5 mg total) by mouth daily.     metFORMIN 1000 MG tablet  Commonly known as:  GLUCOPHAGE  Take 1 tablet (1,000 mg total) by mouth 2 (two) times daily with a meal.     promethazine 12.5 MG tablet  Commonly known as:  PHENERGAN  Take 2 tablets (25 mg total) by mouth every 6 (six) hours as needed for nausea.     simvastatin 20 MG tablet  Commonly known as:  ZOCOR  Take 20 mg by mouth every evening.       No Known Allergies    The results of significant diagnostics from this hospitalization (including imaging, microbiology, ancillary and laboratory) are listed below for reference.    Significant Diagnostic Studies: Dg Chest 2 View  01/29/2014   CLINICAL DATA:  Abdominal pain  EXAM: CHEST  2 VIEW  COMPARISON:  None  FINDINGS: The heart size and mediastinal contours appear normal. There is no pleural effusion or edema. No airspace consolidation identified. Degenerative mild to moderate degenerative disc disease is noted within the lower thoracic spine.  IMPRESSION: No active cardiopulmonary disease.   Electronically Signed   By: Kerby Moors M.D.   On: 01/29/2014 17:06   US Abdomen Complete  01/29/2014   CLINICAL DATA:  Epigastric abdominal pain. Current history of diabetes and hypertension. Prior  history of urinary tract calculi.  EXAM: ULTRASOUND ABDOMEN COMPLETE  COMPARISON:  CT abdomen and pelvis 02/13/2007.  FINDINGS: Gallbladder:  No shadowing gallstones or echogenic sludge. No gallbladder wall thickening or pericholecystic fluid. Negative sonographic Murphy sign according to the ultrasound technologist.  Common bile duct:  Diameter: Approximately 4 mm diameter.  Liver:  Normal size and echotexture without focal parenchymal abnormality. Patent portal vein.  IVC:  Patent.  Pancreas:  Thickened, heterogeneous head and proximal body. Distal body and tail obscured by overlying bowel gas.  Spleen:  Borderline enlarged measuring approximately 13.1 x 5.1 x 12.6 cm, yielding a volume of approximately 420 cc. No focal splenic parenchymal abnormality.  Right Kidney:  Length: Approximately 10.7 cm. Mild diffuse cortical thinning consistent with age. No hydronephrosis. No focal parenchymal abnormality. No visible shadowing calculi.  Left Kidney:  Length: Approximately 11.3 cm. Mild cortical thinning involving the upper pole with well-preserved cortex elsewhere. No hydronephrosis. No focal parenchymal abnormality. No visible shadowing calculi.  Abdominal aorta:  Normal in caliber throughout its visualized course in the abdomen with evidence of atherosclerosis. Obscured distally by overlying bowel gas. Maximum diameter 2.4 cm.  Other findings:  None.  IMPRESSION: 1. Thickened, heterogeneous pancreatic head and proximal body. Is there clinical evidence of pancreatitis? Distal body and tail were obscured by overlying bowel gas. 2. Borderline splenomegaly without focal splenic parenchymal abnormality. 3. Normal-appearing gallbladder.  No biliary ductal dilation.   Electronically Signed   By: Evangeline Dakin M.D.   On: 01/29/2014 16:46   Ct Abdomen Pelvis W Contrast  01/29/2014   CLINICAL DATA:  Abdominal and pelvic pain with nausea.  EXAM: CT ABDOMEN AND PELVIS WITH CONTRAST  TECHNIQUE: Multidetector CT imaging of  the abdomen and pelvis was performed using the standard protocol following bolus administration of intravenous contrast.  CONTRAST:  164mL OMNIPAQUE IOHEXOL 300 MG/ML  SOLN  COMPARISON:  02/13/2007 CT and 01/29/2014 ultrasound  FINDINGS: The liver has a slightly nodular contour suspicious for cirrhosis.  The spleen is upper limits of normal in size.  Mild to moderate bilateral renal cortical thinning is noted.  Adrenal glands are unremarkable except for at a stable small left adrenal myelolipoma.  There has been interval development of a 1.1 x 1.5 cm cystic structure within the pancreatic head (image number 33). There is no evidence of pancreatic ductal dilatation or the pancreatic abnormality.  There is no evidence of abdominal aortic aneurysm, biliary dilatation or worrisome enlarged lymph nodes.  There are few upper normal caliber small bowel loops within the mid abdomen containing gas and some fluid - nonspecific. There is no evidence of bowel obstruction, pneumoperitoneum, or abscess.  Remainder of the bowel and appendix are unremarkable.  The bladder is within normal limits.  The patient is status post hysterectomy.  No acute or suspicious bony abnormalities are noted. Degenerative changes within the lumbar spine are again noted. Marland Kitchen  IMPRESSION: Nonspecific nonobstructive bowel gas pattern with upper limits of normal caliber gas and fluid filled small bowel in the mid abdomen. This probably represents a mild ileus.  1.1 x 1.5 cm pancreatic head cystic structure - new since 2008. MR followup in 1 year is recommended to evaluate stability. This may represent an indolent cystic lesion or IPMN.  Probable cirrhosis.  Upper limits of normal spleen size.   Electronically Signed   By: [name] M.D.   On: 01/29/2014 20:49   Mr 3d Recon At Scanner  01/31/2014   CLINICAL DATA:  One-day history of abdominal pain in the epigastric region radiating to the back.  EXAM: MR 3D RECON AT SCANNER; MR ABDOMEN WO/W CM MRCP   TECHNIQUE: Multiplanar multisequence MR imaging of the abdomen was performed, including heavily T2-weighted images of the biliary and pancreatic ducts. Three-dimensional MR images were rendered by post processing of the original MR data.  CONTRAST:  87mL MULTIHANCE GADOBENATE DIMEGLUMINE 529 MG/ML IV SOLN  COMPARISON:  CT ABD/PELVIS W CM dated 01/29/2014  FINDINGS: Liver has a lobular contour with enlarged caudate lobe suggesting underlying cirrhosis. No evidence of underlying architectural distortion. Portal veins are patent. No evidence ascites. No significant periportal lymphadenopathy. Gallbladder is normal. There is no intrahepatic biliary duct dilatation. The common hepatic duct and common bile duct are normal caliber.  There is a small multilobular cystic lesion in the head of the pancreas which is immediately adjacent to the pancreatic duct. This lesion measures 17 mm x 9 mm by 13 mm (image 21, series 5). No evidence of dilatation of the  pancreas hepatic duct upstream or downstream from this lesion. The pancreatic parenchyma otherwise appears normal. The pancreatic duct is normal through the body and tail. No evidence of enhancement of the cystic lesion in the head of the pancreas. No enhancing lesions throughout the body of the pancreas. No enhancing hepatic lesion.  Spleen is mildly enlarged at 580 cubic cm. The splenic vein is patent. There is thickening of the left adrenal gland which has loss of signal intensity on the post phase imaging consistent with a benign adrenal adenoma. Within the left adrenal gland is a rounded 8 mm lesion (image 47, series 1300) consistent with a small myelolipoma. Kidneys are normal. No abdominal adenopathy.  IMPRESSION: 1. Morphologic changes of early cirrhosis. No evidence architectural distortion or significant portal hypertension. 2. Mild splenomegaly. 3. Cystic lesion in the head of the pancreas may well communicate with the pancreatic duct. Differential considerations  include IPMT and post inflammatory cystic change. Recommend followup MRI without with contrast in 12 months for re-evaluation. This recommendation follows ACR consensus guidelines: Managing Incidental Findings on Abdominal CT: White Paper of the ACR Incidental Findings Committee. J Am Coll Radiol 2010;7:754-773 4. Benign adenoma and myelolipoma of the left adrenal gland.   Electronically Signed   By: Suzy Bouchard M.D.   On: 01/31/2014 13:00   Mr Abd W/wo Cm/mrcp  01/31/2014   CLINICAL DATA:  One-day history of abdominal pain in the epigastric region radiating to the back.  EXAM: MR 3D RECON AT SCANNER; MR ABDOMEN WO/W CM MRCP  TECHNIQUE: Multiplanar multisequence MR imaging of the abdomen was performed, including heavily T2-weighted images of the biliary and pancreatic ducts. Three-dimensional MR images were rendered by post processing of the original MR data.  CONTRAST:  35mL MULTIHANCE GADOBENATE DIMEGLUMINE 529 MG/ML IV SOLN  COMPARISON:  CT ABD/PELVIS W CM dated 01/29/2014  FINDINGS: Liver has a lobular contour with enlarged caudate lobe suggesting underlying cirrhosis. No evidence of underlying architectural distortion. Portal veins are patent. No evidence ascites. No significant periportal lymphadenopathy. Gallbladder is normal. There is no intrahepatic biliary duct dilatation. The common hepatic duct and common bile duct are normal caliber.  There is a small multilobular cystic lesion in the head of the pancreas which is immediately adjacent to the pancreatic duct. This lesion measures 17 mm x 9 mm by 13 mm (image 21, series 5). No evidence of dilatation of the pancreas hepatic duct upstream or downstream from this lesion. The pancreatic parenchyma otherwise appears normal. The pancreatic duct is normal through the body and tail. No evidence of enhancement of the cystic lesion in the head of the pancreas. No enhancing lesions throughout the body of the pancreas. No enhancing hepatic lesion.  Spleen is  mildly enlarged at 580 cubic cm. The splenic vein is patent. There is thickening of the left adrenal gland which has loss of signal intensity on the post phase imaging consistent with a benign adrenal adenoma. Within the left adrenal gland is a rounded 8 mm lesion (image 47, series 1300) consistent with a small myelolipoma. Kidneys are normal. No abdominal adenopathy.  IMPRESSION: 1. Morphologic changes of early cirrhosis. No evidence architectural distortion or significant portal hypertension. 2. Mild splenomegaly. 3. Cystic lesion in the head of the pancreas may well communicate with the pancreatic duct. Differential considerations include IPMT and post inflammatory cystic change. Recommend followup MRI without with contrast in 12 months for re-evaluation. This recommendation follows ACR consensus guidelines: Managing Incidental Findings on Abdominal CT: White Paper of the  ACR Incidental Findings Committee. J Am Coll Radiol 2010;7:754-773 4. Benign adenoma and myelolipoma of the left adrenal gland.   Electronically Signed   By: Suzy Bouchard M.D.   On: 01/31/2014 13:00    Microbiology: No results found for this or any previous visit (from the past 240 hour(s)).   Labs: Basic Metabolic Panel:  Recent Labs Lab 01/29/14 1430 01/30/14 0500 01/31/14 0523  NA 144 143 145  K 4.2 4.5 5.4*  CL 102 104 108  CO2 24 27 24   GLUCOSE 147* 114* 121*  BUN 28* 25* 17  CREATININE 1.53* 1.57* 1.23*  CALCIUM 9.5 8.8 9.0   Liver Function Tests:  Recent Labs Lab 01/29/14 1430 01/30/14 0500  AST 25 16  ALT 14 9  ALKPHOS 177* 166*  BILITOT 0.2* 0.2*  PROT 8.2 6.8  ALBUMIN 4.2 3.5    Recent Labs Lab 01/29/14 1430 01/31/14 0523  LIPASE 61* 39   No results found for this basename: AMMONIA,  in the last 168 hours CBC:  Recent Labs Lab 01/29/14 1430 01/30/14 0500  WBC 10.9* 7.4  HGB 12.2 10.3*  HCT 37.7 32.3*  MCV 80.0 80.5  PLT 180 133*   Cardiac Enzymes: No results found for this  basename: CKTOTAL, CKMB, CKMBINDEX, TROPONINI,  in the last 168 hours BNP: BNP (last 3 results) No results found for this basename: PROBNP,  in the last 8760 hours CBG:  Recent Labs Lab 01/30/14 1921 01/30/14 2342 01/31/14 0405 01/31/14 0826 01/31/14 1217  GLUCAP 163* 92 111* 117* 106*       Signed:  Zyonna Vardaman K  [facility] 01/31/2014, 3:51 PM

## 2014-01-31 NOTE — Progress Notes (Signed)
Daily Rounding Note  01/31/2014, 9:12 AM  LOS: 2 days   SUBJECTIVE:      Some limited, milder epigastric pain with radiation into back, overnight.  Queasy at times, no emesis.  She is hungry, tolerated clears at supper.  NPO waiting for MRCP.  Feels bloated but passing gas, no stools. Urination not diminished.    OBJECTIVE:         Vital signs in last 24 hours:    Temp:  [98.2 F (36.8 C)-98.6 F (37 C)] 98.2 F (36.8 C) (02/24 0514) Pulse Rate:  [80-83] 82 (02/24 0514) Resp:  [17-18] 17 (02/24 0514) BP: (150-166)/(47-61) 150/47 mmHg (02/24 0514) SpO2:  [94 %-98 %] 98 % (02/24 0514)   General: non-toxic, looks tired.     Heart: occasional extra beats but overall regular Chest: clear, no cough, no dyspnea Abdomen: soft, NT, ND.  Obese.  BS active.   Extremities: no pedal edema Neuro/Psych:  Oriented x 3.  No tremor or limb weakness.   Intake/Output from previous day: 02/23 0701 - 02/24 0700 In: 1285 [I.V.:1285] Out: -   Intake/Output this shift:    Lab Results:  Recent Labs  01/29/14 1430 01/30/14 0500  WBC 10.9* 7.4  HGB 12.2 10.3*  HCT 37.7 32.3*  PLT 180 133*   BMET  Recent Labs  01/29/14 1430 01/30/14 0500 01/31/14 0523  NA 144 143 145  K 4.2 4.5 5.4*  CL 102 104 108  CO2 24 27 24   GLUCOSE 147* 114* 121*  BUN 28* 25* 17  CREATININE 1.53* 1.57* 1.23*  CALCIUM 9.5 8.8 9.0   LFT  Recent Labs  01/29/14 1430 01/30/14 0500  PROT 8.2 6.8  ALBUMIN 4.2 3.5  AST 25 16  ALT 14 9  ALKPHOS 177* 166*  BILITOT 0.2* 0.2*  BILIDIR <0.2  --   IBILI NOT CALCULATED  --    Lipase     Component Value Date/Time   LIPASE 39 01/31/2014 0523   Hepatitis Panel  Recent Labs  01/30/14 1529  HEPBSAG NEGATIVE    Studies/Results: US Abdomen Complete 01/29/2014  FINDINGS: Gallbladder:  No shadowing gallstones or echogenic sludge. No gallbladder wall thickening or pericholecystic fluid. Negative  sonographic Murphy sign according to the ultrasound technologist.  Common bile duct:  Diameter: Approximately 4 mm diameter.  Liver:  Normal size and echotexture without focal parenchymal abnormality. Patent portal vein.  IVC:  Patent.  Pancreas:  Thickened, heterogeneous head and proximal body. Distal body and tail obscured by overlying bowel gas.  Spleen:  Borderline enlarged measuring approximately 13.1 x 5.1 x 12.6 cm, yielding a volume of approximately 420 cc. No focal splenic parenchymal abnormality.  Right Kidney:  Length: Approximately 10.7 cm. Mild diffuse cortical thinning consistent with age. No hydronephrosis. No focal parenchymal abnormality. No visible shadowing calculi.  Left Kidney:  Length: Approximately 11.3 cm. Mild cortical thinning involving the upper pole with well-preserved cortex elsewhere. No hydronephrosis. No focal parenchymal abnormality. No visible shadowing calculi.  Abdominal aorta:  Normal in caliber throughout its visualized course in the abdomen with evidence of atherosclerosis. Obscured distally by overlying bowel gas. Maximum diameter 2.4 cm.  Other findings:  None.  IMPRESSION: 1. Thickened, heterogeneous pancreatic head and proximal body. Is there clinical evidence of pancreatitis? Distal body and tail were obscured by overlying bowel gas. 2. Borderline splenomegaly without focal splenic parenchymal abnormality. 3. Normal-appearing gallbladder.  No biliary ductal dilation.   Electronically Signed  By: Evangeline Dakin M.D.   On: 01/29/2014 16:46   Ct Abdomen Pelvis W Contrast 01/29/2014     FINDINGS: The liver has a slightly nodular contour suspicious for cirrhosis.  The spleen is upper limits of normal in size.  Mild to moderate bilateral renal cortical thinning is noted.  Adrenal glands are unremarkable except for at a stable small left adrenal myelolipoma.  There has been interval development of a 1.1 x 1.5 cm cystic structure within the pancreatic head (image number 33).  There is no evidence of pancreatic ductal dilatation or the pancreatic abnormality.  There is no evidence of abdominal aortic aneurysm, biliary dilatation or worrisome enlarged lymph nodes.  There are few upper normal caliber small bowel loops within the mid abdomen containing gas and some fluid - nonspecific. There is no evidence of bowel obstruction, pneumoperitoneum, or abscess.  Remainder of the bowel and appendix are unremarkable.  The bladder is within normal limits.  The patient is status post hysterectomy.  No acute or suspicious bony abnormalities are noted. Degenerative changes within the lumbar spine are again noted. Marland Kitchen  IMPRESSION: Nonspecific nonobstructive bowel gas pattern with upper limits of normal caliber gas and fluid filled small bowel in the mid abdomen. This probably represents a mild ileus.  1.1 x 1.5 cm pancreatic head cystic structure - new since 2008. MR followup in 1 year is recommended to evaluate stability. This may represent an indolent cystic lesion or IPMN.  Probable cirrhosis.  Upper limits of normal spleen size.   Electronically Signed   By: [name] M.D.   On: 01/29/2014 20:49    ASSESMENT:   * New cystic lesion in HOP, rule out IPMN vs pseudocyst. . Mild elevation of lipase with now resolved epigastric pain, rule out mild pancreatitis. No antecedent sxs or history to suggest previous pancreatitis. CA 19-9 in process.  Epigastric pain improved. * Probable cirrhosis of liver per CT scan. Suspect due to fatty liver but need to rule out viral and autoimmune causes. Hep B surface Ag negative. Other serologies pending.  * Borderline splenomegaly per imaging.  * Hx adenomatous colon polyps 04/2010, due repeat colonoscopy 04/2015.  * Normocytic anemia.  * Renal insufficiency, CKD.  BUN and creatinine improved.  * NIDDM    PLAN   *  Advance diet after completion of MRCP.  Addendum at 1300: MRI/MRCP IMPRESSION:  1. Morphologic changes of early cirrhosis. No evidence  architectural  distortion or significant portal hypertension.  2. Mild splenomegaly.  3. Cystic lesion in the head of the pancreas may well communicate  with the pancreatic duct. Differential considerations include IPMT  and post inflammatory cystic change. Recommend followup MRI without  with contrast in 12 months for re-evaluation. This recommendation  follows ACR consensus guidelines: Managing Incidental Findings on  Abdominal CT: White Paper of the ACR Incidental Findings Committee.  J Am Coll Radiol 2010;7:754-773  4. Benign adenoma and myelolipoma of the left adrenal gland.   Azucena Freed  01/31/2014, 9:12 AM Pager: [phone]    Attending physician's note   I have taken an interval history, reviewed the chart and examined the patient. I agree with the Advanced Practitioner's note, impression and recommendations. MRI/MRCP impression:   1. Morphologic changes of early cirrhosis. No evidence architectural distortion or significant portal hypertension.  2. Mild splenomegaly.  3. Cystic lesion in the head of the pancreas may well communicate with the pancreatic duct. Differential considerations include IPMT and post inflammatory cystic change. Recommend followup  MRI without with contrast in 12 months for re-evaluation.  Etiology of pancreatitis is not clear. Pancreatitis rapidly improving. Advance diet as tolerated. She might be ready for discharge tomorrow.  Follow of pancreatic head cystic lesion as per MRI recommendation. Complete the cirrhosis evaluation as outpatient with Dr. Scarlette Shorts.  GI signing off. Please call if needed.   Pricilla Riffle. Fuller Plan, MD [facility]

## 2014-01-31 NOTE — Discharge Instructions (Signed)
Acute Pancreatitis °Acute pancreatitis is a disease in which the pancreas becomes suddenly inflamed. The pancreas is a large gland located behind your stomach. The pancreas produces enzymes that help digest food. The pancreas also releases the hormones glucagon and insulin that help regulate blood sugar. Damage to the pancreas occurs when the digestive enzymes from the pancreas are activated and begin attacking the pancreas before being released into the intestine. Most acute attacks last a couple of days and can cause serious complications. Some people become dehydrated and develop low blood pressure. In severe cases, bleeding into the pancreas can lead to shock and can be life-threatening. The lungs, heart, and kidneys may fail. °CAUSES  °Pancreatitis can happen to anyone. In some cases, the cause is unknown. Most cases are caused by: °· Alcohol abuse. °· Gallstones. °Other less common causes are: °· Certain medicines. °· Exposure to certain chemicals. °· Infection. °· Damage caused by an accident (trauma). °· Abdominal surgery. °SYMPTOMS  °· Pain in the upper abdomen that may radiate to the back. °· Tenderness and swelling of the abdomen. °· Nausea and vomiting. °DIAGNOSIS  °Your caregiver will perform a physical exam. Blood and stool tests may be done to confirm the diagnosis. Imaging tests may also be done, such as X-rays, CT scans, or an ultrasound of the abdomen. °TREATMENT  °Treatment usually requires a stay in the hospital. Treatment may include: °· Pain medicine. °· Fluid replacement through an intravenous line (IV). °· Placing a tube in the stomach to remove stomach contents and control vomiting. °· Not eating for 3 or 4 days. This gives your pancreas a rest, because enzymes are not being produced that can cause further damage. °· Antibiotic medicines if your condition is caused by an infection. °· Surgery of the pancreas or gallbladder. °HOME CARE INSTRUCTIONS  °· Follow the diet advised by your  caregiver. This may involve avoiding alcohol and decreasing the amount of fat in your diet. °· Eat smaller, more frequent meals. This reduces the amount of digestive juices the pancreas produces. °· Drink enough fluids to keep your urine clear or pale yellow. °· Only take over-the-counter or prescription medicines as directed by your caregiver. °· Avoid drinking alcohol if it caused your condition. °· Do not smoke. °· Get plenty of rest. °· Check your blood sugar at home as directed by your caregiver. °· Keep all follow-up appointments as directed by your caregiver. °SEEK MEDICAL CARE IF:  °· You do not recover as quickly as expected. °· You develop new or worsening symptoms. °· You have persistent pain, weakness, or nausea. °· You recover and then have another episode of pain. °SEEK IMMEDIATE MEDICAL CARE IF:  °· You are unable to eat or keep fluids down. °· Your pain becomes severe. °· You have a fever or persistent symptoms for more than 2 to 3 days. °· You have a fever and your symptoms suddenly get worse. °· Your skin or the white part of your eyes turn yellow (jaundice). °· You develop vomiting. °· You feel dizzy, or you faint. °· Your blood sugar is high (over 300 mg/dL). °MAKE SURE YOU:  °· Understand these instructions. °· Will watch your condition. °· Will get help right away if you are not doing well or get worse. °Document Released: 11/24/2005 Document Revised: 05/25/2012 Document Reviewed: 03/04/2012 °ExitCare® Patient Information ©2014 [employer]. ° °

## 2014-02-01 ENCOUNTER — Telehealth: Payer: Self-pay | Admitting: Internal Medicine

## 2014-02-01 ENCOUNTER — Encounter: Payer: Self-pay | Admitting: Internal Medicine

## 2014-02-01 DIAGNOSIS — K859 Acute pancreatitis without necrosis or infection, unspecified: Secondary | ICD-10-CM

## 2014-02-01 DIAGNOSIS — R109 Unspecified abdominal pain: Secondary | ICD-10-CM

## 2014-02-01 LAB — MITOCHONDRIAL ANTIBODIES: Mitochondrial M2 Ab, IgG: 0.31 (ref <0.91)

## 2014-02-01 LAB — ANA: Anti Nuclear Antibody(ANA): NEGATIVE

## 2014-02-01 LAB — ANTI-SMOOTH MUSCLE ANTIBODY, IGG: F-ACTIN AB IGG: 6 U (ref <20)

## 2014-02-01 NOTE — Telephone Encounter (Signed)
Okay please schedule

## 2014-02-01 NOTE — Telephone Encounter (Signed)
Okay to do referral to GI?

## 2014-02-01 NOTE — Telephone Encounter (Signed)
Pt needs more samples of invokana

## 2014-02-01 NOTE — Telephone Encounter (Signed)
Pt's daughter walked in for samples of Invokana 300 mg . Samples given.

## 2014-02-01 NOTE — Telephone Encounter (Signed)
Pt was in hosp for pancreas attack. Pt instructed to fu w/ dr Scarlette Shorts @ [facility]. Pt needs referral for this appt. Pls advise. Pt instructed to make appt for a month out.

## 2014-02-01 NOTE — Telephone Encounter (Signed)
Spoke to pt told her order for referral to GI was done and someone will contact you for an appointment. Pt verbalized understanding and stated she is already scheduled 4/14. Told her okay.

## 2014-02-04 ENCOUNTER — Emergency Department (HOSPITAL_COMMUNITY): Payer: Medicare HMO

## 2014-02-04 ENCOUNTER — Inpatient Hospital Stay (HOSPITAL_COMMUNITY)
Admission: EM | Admit: 2014-02-04 | Discharge: 2014-02-06 | DRG: 313 | Disposition: A | Discharge status: Home / Self Care | Payer: Medicare HMO | Attending: Internal Medicine | Admitting: Internal Medicine

## 2014-02-04 ENCOUNTER — Encounter (HOSPITAL_COMMUNITY): Payer: Self-pay | Admitting: Emergency Medicine

## 2014-02-04 DIAGNOSIS — Z794 Long term (current) use of insulin: Secondary | ICD-10-CM

## 2014-02-04 DIAGNOSIS — N182 Chronic kidney disease, stage 2 (mild): Secondary | ICD-10-CM

## 2014-02-04 DIAGNOSIS — I10 Essential (primary) hypertension: Secondary | ICD-10-CM

## 2014-02-04 DIAGNOSIS — K863 Pseudocyst of pancreas: Secondary | ICD-10-CM

## 2014-02-04 DIAGNOSIS — K859 Acute pancreatitis without necrosis or infection, unspecified: Secondary | ICD-10-CM

## 2014-02-04 DIAGNOSIS — I129 Hypertensive chronic kidney disease with stage 1 through stage 4 chronic kidney disease, or unspecified chronic kidney disease: Secondary | ICD-10-CM | POA: Diagnosis present

## 2014-02-04 DIAGNOSIS — N183 Chronic kidney disease, stage 3 unspecified: Secondary | ICD-10-CM | POA: Diagnosis present

## 2014-02-04 DIAGNOSIS — N2889 Other specified disorders of kidney and ureter: Secondary | ICD-10-CM

## 2014-02-04 DIAGNOSIS — E785 Hyperlipidemia, unspecified: Secondary | ICD-10-CM

## 2014-02-04 DIAGNOSIS — K862 Cyst of pancreas: Secondary | ICD-10-CM | POA: Diagnosis present

## 2014-02-04 DIAGNOSIS — R609 Edema, unspecified: Secondary | ICD-10-CM

## 2014-02-04 DIAGNOSIS — Z79899 Other long term (current) drug therapy: Secondary | ICD-10-CM

## 2014-02-04 DIAGNOSIS — M25559 Pain in unspecified hip: Secondary | ICD-10-CM

## 2014-02-04 DIAGNOSIS — I4892 Unspecified atrial flutter: Secondary | ICD-10-CM

## 2014-02-04 DIAGNOSIS — E119 Type 2 diabetes mellitus without complications: Secondary | ICD-10-CM

## 2014-02-04 DIAGNOSIS — IMO0001 Reserved for inherently not codable concepts without codable children: Secondary | ICD-10-CM | POA: Diagnosis present

## 2014-02-04 DIAGNOSIS — Z87891 Personal history of nicotine dependence: Secondary | ICD-10-CM

## 2014-02-04 DIAGNOSIS — R0789 Other chest pain: Principal | ICD-10-CM

## 2014-02-04 DIAGNOSIS — K7460 Unspecified cirrhosis of liver: Secondary | ICD-10-CM

## 2014-02-04 DIAGNOSIS — R079 Chest pain, unspecified: Secondary | ICD-10-CM

## 2014-02-04 DIAGNOSIS — R82998 Other abnormal findings in urine: Secondary | ICD-10-CM

## 2014-02-04 DIAGNOSIS — K869 Disease of pancreas, unspecified: Secondary | ICD-10-CM

## 2014-02-04 DIAGNOSIS — I2510 Atherosclerotic heart disease of native coronary artery without angina pectoris: Secondary | ICD-10-CM

## 2014-02-04 DIAGNOSIS — Z7982 Long term (current) use of aspirin: Secondary | ICD-10-CM

## 2014-02-04 DIAGNOSIS — Z87442 Personal history of urinary calculi: Secondary | ICD-10-CM

## 2014-02-04 DIAGNOSIS — I4891 Unspecified atrial fibrillation: Secondary | ICD-10-CM | POA: Diagnosis present

## 2014-02-04 DIAGNOSIS — R933 Abnormal findings on diagnostic imaging of other parts of digestive tract: Secondary | ICD-10-CM

## 2014-02-04 DIAGNOSIS — R1013 Epigastric pain: Secondary | ICD-10-CM

## 2014-02-04 LAB — COMPREHENSIVE METABOLIC PANEL
ALBUMIN: 3.6 g/dL (ref 3.5–5.2)
ALK PHOS: 293 U/L — AB (ref 39–117)
ALT: 8 U/L (ref 0–35)
AST: 14 U/L (ref 0–37)
BUN: 24 mg/dL — ABNORMAL HIGH (ref 6–23)
CO2: 26 mEq/L (ref 19–32)
Calcium: 9.6 mg/dL (ref 8.4–10.5)
Chloride: 99 mEq/L (ref 96–112)
Creatinine, Ser: 1.54 mg/dL — ABNORMAL HIGH (ref 0.50–1.10)
GFR calc Af Amer: 38 mL/min — ABNORMAL LOW (ref >90)
GFR calc non Af Amer: 33 mL/min — ABNORMAL LOW (ref >90)
Glucose, Bld: 230 mg/dL — ABNORMAL HIGH (ref 70–99)
POTASSIUM: 3.9 meq/L (ref 3.7–5.3)
Sodium: 143 mEq/L (ref 137–147)
Total Bilirubin: 0.4 mg/dL (ref 0.3–1.2)
Total Protein: 7.5 g/dL (ref 6.0–8.3)

## 2014-02-04 LAB — URINE MICROSCOPIC-ADD ON

## 2014-02-04 LAB — CBC WITH DIFFERENTIAL/PLATELET
BASOS ABS: 0 10*3/uL (ref 0.0–0.1)
BASOS PCT: 0 % (ref 0–1)
Eosinophils Absolute: 0.1 10*3/uL (ref 0.0–0.7)
Eosinophils Relative: 1 % (ref 0–5)
HCT: 35 % — ABNORMAL LOW (ref 36.0–46.0)
Hemoglobin: 11 g/dL — ABNORMAL LOW (ref 12.0–15.0)
Lymphocytes Relative: 24 % (ref 12–46)
Lymphs Abs: 2.4 10*3/uL (ref 0.7–4.0)
MCH: 25 pg — ABNORMAL LOW (ref 26.0–34.0)
MCHC: 31.4 g/dL (ref 30.0–36.0)
MCV: 79.5 fL (ref 78.0–100.0)
Monocytes Absolute: 0.5 10*3/uL (ref 0.1–1.0)
Monocytes Relative: 5 % (ref 3–12)
NEUTROS ABS: 7.1 10*3/uL (ref 1.7–7.7)
NEUTROS PCT: 71 % (ref 43–77)
PLATELETS: 153 10*3/uL (ref 150–400)
RBC: 4.4 MIL/uL (ref 3.87–5.11)
RDW: 15.5 % (ref 11.5–15.5)
WBC: 10.1 10*3/uL (ref 4.0–10.5)

## 2014-02-04 LAB — LIPASE, BLOOD: Lipase: 66 U/L — ABNORMAL HIGH (ref 11–59)

## 2014-02-04 LAB — GLUCOSE, CAPILLARY
GLUCOSE-CAPILLARY: 212 mg/dL — AB (ref 70–99)
Glucose-Capillary: 166 mg/dL — ABNORMAL HIGH (ref 70–99)
Glucose-Capillary: 196 mg/dL — ABNORMAL HIGH (ref 70–99)
Glucose-Capillary: 196 mg/dL — ABNORMAL HIGH (ref 70–99)

## 2014-02-04 LAB — URINALYSIS, ROUTINE W REFLEX MICROSCOPIC
BILIRUBIN URINE: NEGATIVE
Hgb urine dipstick: NEGATIVE
KETONES UR: 15 mg/dL — AB
LEUKOCYTES UA: NEGATIVE
Nitrite: NEGATIVE
PH: 6 (ref 5.0–8.0)
Protein, ur: NEGATIVE mg/dL
SPECIFIC GRAVITY, URINE: 1.027 (ref 1.005–1.030)
Urobilinogen, UA: 0.2 mg/dL (ref 0.0–1.0)

## 2014-02-04 LAB — TROPONIN I
Troponin I: <0.3 ng/mL (ref <0.30)
Troponin I: <0.3 ng/mL (ref <0.30)

## 2014-02-04 LAB — D-DIMER, QUANTITATIVE: D-Dimer, Quant: 12.97 ug/mL-FEU — ABNORMAL HIGH (ref 0.00–0.48)

## 2014-02-04 MED ORDER — ONDANSETRON HCL 4 MG/2ML IJ SOLN: 4.0000 mg | Freq: Four times a day (QID) | INTRAMUSCULAR | Status: DC | PRN

## 2014-02-04 MED ORDER — AMLODIPINE BESYLATE 5 MG PO TABS
5.0000 mg | ORAL_TABLET | Freq: Every day | ORAL | Status: DC
  Administered 2014-02-04 – 2014-02-06 (×3): 5 mg via ORAL
  Filled 2014-02-04 (×3): qty 1

## 2014-02-04 MED ORDER — ACETAMINOPHEN 325 MG PO TABS: 650.0000 mg | ORAL_TABLET | Freq: Four times a day (QID) | ORAL | Status: DC | PRN

## 2014-02-04 MED ORDER — MORPHINE SULFATE 4 MG/ML IJ SOLN
4.0000 mg | Freq: Once | INTRAMUSCULAR | Status: AC
  Administered 2014-02-04: 4 mg via INTRAVENOUS
  Filled 2014-02-04: qty 1

## 2014-02-04 MED ORDER — HYDROMORPHONE HCL PF 1 MG/ML IJ SOLN
1.0000 mg | INTRAMUSCULAR | Status: DC | PRN
  Administered 2014-02-04 – 2014-02-06 (×10): 1 mg via INTRAVENOUS
  Filled 2014-02-04 (×11): qty 1

## 2014-02-04 MED ORDER — LORAZEPAM 0.5 MG PO TABS
0.5000 mg | ORAL_TABLET | Freq: Four times a day (QID) | ORAL | Status: DC | PRN
  Administered 2014-02-04: 0.5 mg via ORAL
  Filled 2014-02-04: qty 1

## 2014-02-04 MED ORDER — ACETAMINOPHEN 650 MG RE SUPP: 650.0000 mg | Freq: Four times a day (QID) | RECTAL | Status: DC | PRN

## 2014-02-04 MED ORDER — ENOXAPARIN SODIUM 40 MG/0.4ML ~~LOC~~ SOLN
40.0000 mg | SUBCUTANEOUS | Status: DC
Start: 2014-02-04 — End: 2014-02-06
  Administered 2014-02-04 – 2014-02-06 (×3): 40 mg via SUBCUTANEOUS
  Filled 2014-02-04 (×3): qty 0.4

## 2014-02-04 MED ORDER — PANTOPRAZOLE SODIUM 40 MG PO TBEC
40.0000 mg | DELAYED_RELEASE_TABLET | Freq: Every day | ORAL | Status: DC
  Administered 2014-02-04 – 2014-02-06 (×3): 40 mg via ORAL
  Filled 2014-02-04 (×3): qty 1

## 2014-02-04 MED ORDER — HYDROMORPHONE HCL PF 1 MG/ML IJ SOLN
2.0000 mg | Freq: Once | INTRAMUSCULAR | Status: AC
  Administered 2014-02-04: 2 mg via INTRAVENOUS
  Filled 2014-02-04: qty 2

## 2014-02-04 MED ORDER — REGADENOSON 0.4 MG/5ML IV SOLN
0.4000 mg | Freq: Once | INTRAVENOUS | Status: AC
  Administered 2014-02-05: 0.4 mg via INTRAVENOUS
  Filled 2014-02-04: qty 5

## 2014-02-04 MED ORDER — SODIUM CHLORIDE 0.9 % IV SOLN
INTRAVENOUS | Status: DC
  Administered 2014-02-04 – 2014-02-06 (×3): via INTRAVENOUS

## 2014-02-04 MED ORDER — IOHEXOL 350 MG/ML SOLN
80.0000 mL | Freq: Once | INTRAVENOUS | Status: AC | PRN
  Administered 2014-02-04: 80 mL via INTRAVENOUS

## 2014-02-04 MED ORDER — NITROGLYCERIN 0.4 MG SL SUBL
0.4000 mg | SUBLINGUAL_TABLET | SUBLINGUAL | Status: DC | PRN
  Administered 2014-02-04 (×2): 0.4 mg via SUBLINGUAL
  Filled 2014-02-04: qty 25

## 2014-02-04 MED ORDER — GI COCKTAIL ~~LOC~~
30.0000 mL | Freq: Once | ORAL | Status: AC
  Administered 2014-02-04: 30 mL via ORAL
  Filled 2014-02-04: qty 30

## 2014-02-04 MED ORDER — SIMVASTATIN 20 MG PO TABS
20.0000 mg | ORAL_TABLET | Freq: Every evening | ORAL | Status: DC
  Administered 2014-02-04 – 2014-02-05 (×2): 20 mg via ORAL
  Filled 2014-02-04 (×3): qty 1

## 2014-02-04 MED ORDER — HYDROCODONE-ACETAMINOPHEN 5-325 MG PO TABS
1.0000 | ORAL_TABLET | Freq: Four times a day (QID) | ORAL | Status: DC | PRN
  Administered 2014-02-04 – 2014-02-06 (×6): 1 via ORAL
  Filled 2014-02-04 (×6): qty 1

## 2014-02-04 MED ORDER — INSULIN ASPART 100 UNIT/ML ~~LOC~~ SOLN
0.0000 [IU] | Freq: Three times a day (TID) | SUBCUTANEOUS | Status: DC
  Administered 2014-02-04: 2 [IU] via SUBCUTANEOUS
  Administered 2014-02-05: 5 [IU] via SUBCUTANEOUS
  Administered 2014-02-05 – 2014-02-06 (×2): 2 [IU] via SUBCUTANEOUS

## 2014-02-04 MED ORDER — ASPIRIN 325 MG PO TABS
325.0000 mg | ORAL_TABLET | Freq: Every day | ORAL | Status: DC
  Administered 2014-02-04 – 2014-02-06 (×3): 325 mg via ORAL
  Filled 2014-02-04 (×3): qty 1

## 2014-02-04 MED ORDER — HYDROMORPHONE HCL PF 1 MG/ML IJ SOLN
1.0000 mg | Freq: Once | INTRAMUSCULAR | Status: AC
  Administered 2014-02-04: 1 mg via INTRAVENOUS
  Filled 2014-02-04: qty 1

## 2014-02-04 MED ORDER — PROMETHAZINE HCL 25 MG PO TABS: 25.0000 mg | ORAL_TABLET | Freq: Four times a day (QID) | ORAL | Status: DC | PRN

## 2014-02-04 MED ORDER — METOPROLOL TARTRATE 12.5 MG HALF TABLET
12.5000 mg | ORAL_TABLET | Freq: Two times a day (BID) | ORAL | Status: DC
  Administered 2014-02-04 – 2014-02-06 (×3): 12.5 mg via ORAL
  Filled 2014-02-04 (×5): qty 1

## 2014-02-04 NOTE — ED Notes (Signed)
Pt delayed due to having increased pain. Admitting MD paged for medication to be transported to the unit.

## 2014-02-04 NOTE — ED Notes (Signed)
The pt is c/o lt upper chest pain since 1800 tonight.  Sob and it hurts to breathe

## 2014-02-04 NOTE — ED Notes (Signed)
Admitting MD called. Patient complains of pain 9/10.

## 2014-02-04 NOTE — Consult Note (Signed)
Primary cardiologist: Consulting cardiologist:  Clinical Summary Ms. Romer is a 73 y.o.female history of DM, HL, HTN, mild to moderate non-obstructive CAD, pacncreatic mass admitted with chest pain. She describes a constant pressure in her mid chest lasting consistently (several hours), with no other associated symptoms. Not worst with position or breathing. Only pain relief is taking pain medications. Has not had pain like this before she reports. Denies any N/V, no diaphoresis, no SOB.   Cath 2002: LM patent, LAD diffuse 20%, LCX patent, RCA ostial 50% w/ dampening of pressures. Managed medically.   CT PE negative, did show evidence of diffuse coronary calcifications. Iniital EKG showed aflutter w/ variable block. Followup EKG with NSR, no acute ischemic changes. Troponin negative x 2.   No Known Allergies  Medications Scheduled Medications: . amLODipine  5 mg Oral Daily  . aspirin  325 mg Oral Daily  . enoxaparin (LOVENOX) injection  40 mg Subcutaneous Q24H  . pantoprazole  40 mg Oral Q1200  . simvastatin  20 mg Oral QPM     Infusions: . sodium chloride 75 mL/hr at 02/04/14 1223     PRN Medications:  acetaminophen, acetaminophen, HYDROcodone-acetaminophen, HYDROmorphone (DILAUDID) injection, LORazepam, nitroGLYCERIN, ondansetron (ZOFRAN) IV, promethazine   Past Medical History  Diagnosis Date  . DIABETES MELLITUS, TYPE II 07/13/2007  . HYPERLIPIDEMIA 12/24/2007  . HYPERTENSION 07/13/2007  . CORONARY ARTERY DISEASE 12/24/2007  . NEPHROLITHIASIS, HX OF 12/24/2007  . Tubular adenoma of colon 04/2010    on colonoscopy by Scarlette Shorts MD    Past Surgical History  Procedure Laterality Date  . Excisional hemorrhoidectomy  unknown  . Abdominal hysterectomy  unknown  . Nasal sinus surgery  unknown  . Cardiac catheterization  2002  . Cataract extraction Bilateral     Family History  Problem Relation Age of Onset  . Cancer Father     throat ca  . COPD Father   . Cancer  Sister     uterine ca and breast ca  . Cancer Brother     throat    Social History Ms. Colvert reports that she quit smoking about 13 years ago. Her smoking use included Cigarettes. She smoked 0.00 packs per day. She has never used smokeless tobacco. Ms. Dusseault reports that she does not drink alcohol.  Review of Systems CONSTITUTIONAL: No weight loss, fever, chills, weakness or fatigue.  HEENT: Eyes: No visual loss, blurred vision, double vision or yellow sclerae. No hearing loss, sneezing, congestion, runny nose or sore throat.  SKIN: No rash or itching.  CARDIOVASCULAR: per HPI  RESPIRATORY: No shortness of breath, cough or sputum.  GASTROINTESTINAL: No anorexia, nausea, vomiting or diarrhea. No abdominal pain or blood.  GENITOURINARY: no polyuria, no dysuria NEUROLOGICAL: No headache, dizziness, syncope, paralysis, ataxia, numbness or tingling in the extremities. No change in bowel or bladder control.  MUSCULOSKELETAL: No muscle, back pain, joint pain or stiffness.  HEMATOLOGIC: No anemia, bleeding or bruising.  LYMPHATICS: No enlarged nodes. No history of splenectomy.  PSYCHIATRIC: No history of depression or anxiety.      Physical Examination Blood pressure 121/60, pulse 103, temperature 97.7 F (36.5 C), temperature source Oral, resp. rate 18, height 5\' 2"  (1.575 m), weight 180 lb (81.647 kg), SpO2 97.00%. No intake or output data in the 24 hours ending 02/04/14 1453  Cardiovascular: RRR, no m/r/g, no JVD, no carotid bruits  Respiratory: CTAB  GI: abdomen soft, NT, ND  MSK: no LE edema  Neuro: no focal deficits  Lab Results  Basic Metabolic Panel:  Recent Labs Lab 01/29/14 1430 01/30/14 0500 01/31/14 0523 02/04/14 0425  NA 144 143 145 143  K 4.2 4.5 5.4* 3.9  CL 102 104 108 99  CO2 24 27 24 26   GLUCOSE 147* 114* 121* 230*  BUN 28* 25* 17 24*  CREATININE 1.53* 1.57* 1.23* 1.54*  CALCIUM 9.5 8.8 9.0 9.6    Liver Function Tests:  Recent Labs Lab  01/29/14 1430 01/30/14 0500 02/04/14 0425  AST 25 16 14   ALT 14 9 8   ALKPHOS 177* 166* 293*  BILITOT 0.2* 0.2* 0.4  PROT 8.2 6.8 7.5  ALBUMIN 4.2 3.5 3.6    CBC:  Recent Labs Lab 01/29/14 1430 01/30/14 0500 02/04/14 0425  WBC 10.9* 7.4 10.1  NEUTROABS  --   --  7.1  HGB 12.2 10.3* 11.0*  HCT 37.7 32.3* 35.0*  MCV 80.0 80.5 79.5  PLT 180 133* 153    Cardiac Enzymes:  Recent Labs Lab 02/04/14 0425 02/04/14 1125  TROPONINI <0.30 <0.30    BNP: No components found with this basename: POCBNP,    ECG niital EKG showed aflutter w/ variable block. Followup EKG with NSR, no acute ischemic changes.   Imaging 05/2001 Cath DIAGNOSES:  1. Single-vessel coronary artery disease.  2. Normal left ventricular systolic function, elevated left ventricular  end-diastolic pressure.  LOCATION: Access was obtained from the right femoral artery. The JL4 and JR4  catheters were used.  COMPLICATIONS: None.  HEMODYNAMICS: Left ventricular pressure 118/25 mmHg, aortic pressure 118/62  mmHg.  VENTRICULOGRAPHY: Performed from single plane area. Projected ejection  fraction was 70% with no segmental wall motion abnormalities.  SELECTIVE CORONARY ANGIOGRAPHY:  1. Left main coronary was free of flow-limiting disease.  2. Left anterior descending artery had proximal diffuse 20% stenosis involving  the first diagonal Mazie Fencl with a 40% ostial stenosis.  3. Left circumflex coronary artery was a large caliber vessel with no evidence  of flow-limiting coronary artery disease.  4. The right coronary artery has an ostial diffuse proximal 50% stenosis.  There was damping of the pressure waves when engaging the JL4 catheter.  Normal dye efflux was observed. There was further vessel diameter  increased with intracoronary nitroglycerin. The remainder of the  right coronary artery at the level of the mid vessel with 30-40%  diffuse stenosis at the level of an RV Ivadell Gaul.  CONCLUSIONS AND  RECOMMENDATIONS: The patient appears to have single-vessel  coronary artery disease. Start medical therapy as indicated. Aspirin, beta  blocker, and ACE inhibitor can be continued. A statin should be started lipid  ______ check in six weeks.  PLAN: The plan is to discharge the patient in the morning.      Impression/Recommendations 1. Atypical chest pain - she describes constant (over 24 hrs) pain in midchest with no associated symptoms - no evidence of ACS by EKG or cardiac enzymes at this time.  - she does have a history of mild to mod CAD by cath in 2002  - echo pending, given her history will obtain a lexiscan MPI to further evaluate.  - continue ASA and statin, will avoid high dose statin in the setting of possible cirrhosis  2. Atrial arrythmias - intermittent irregular rhythm, appears to have intermittent episodes of aflutter noted on EKG and telemetry. Normal rates.  - CHADS2Vasc score is 4. Given new diagnosis of pancreatic mass and evidence of cirrhosis by imaging, will defer anticoagulation at this time.   - will start  low dose metoprolol, continue ASA.  Carlyle Dolly, M.D., F.A.C.C.

## 2014-02-04 NOTE — H&P (Addendum)
[facility] History and Physical  CHELSAE MCGLOIN [id number] DOB: 04-28-1941 DOA: 02/04/2014  Referring physician: EDP PCP: [name], MD   Chief Complaint: chest pain  HPI: [name] is a 73 y.o. female with PMH of DM, Single vessel CAD on cath in 2002, HTN, Dyslipidemia, recently discharged from hospital after evaluation for cystic lesion in head of pancreas and mild pancreatitis, she is supposed to FU with GI next month for further workup for this. After discharge home next day started experiencing substernal/upper chest pressure, heaviness radiating up to her shoulders, arms. This is ongoing for 1-2 days now and hence presented to the ER. No nausea or Vomiting, no bowel disturbances, no fevers or chills. In ER, had elevated  D-dimer hence had CTA chest which was negative for PE, diffuse coronary calcifications  Review of Systems:  Constitutional:  No weight loss, night sweats, Fevers, chills, fatigue.  HEENT:  No headaches, Difficulty swallowing,Tooth/dental problems,Sore throat,  No sneezing, itching, ear ache, nasal congestion, post nasal drip,  Cardio-vascular:  No chest pain, Orthopnea, PND, swelling in lower extremities, anasarca, dizziness, palpitations  GI:  No heartburn, indigestion, abdominal pain, nausea, vomiting, diarrhea, change in bowel habits, loss of appetite  Resp:  No shortness of breath with exertion or at rest. No excess mucus, no productive cough, No non-productive cough, No coughing up of blood.No change in color of mucus.No wheezing.No chest wall deformity  Skin:  no rash or lesions.  GU:  no dysuria, change in color of urine, no urgency or frequency. No flank pain.  Musculoskeletal:  No joint pain or swelling. No decreased range of motion. No back pain.  Psych:  No change in mood or affect. No depression or anxiety. No memory loss.   Past Medical History  Diagnosis Date  . DIABETES MELLITUS, TYPE II 07/13/2007  .  HYPERLIPIDEMIA 12/24/2007  . HYPERTENSION 07/13/2007  . CORONARY ARTERY DISEASE 12/24/2007  . NEPHROLITHIASIS, HX OF 12/24/2007  . Tubular adenoma of colon 04/2010    on colonoscopy by Scarlette Shorts MD   Past Surgical History  Procedure Laterality Date  . Excisional hemorrhoidectomy  unknown  . Abdominal hysterectomy  unknown  . Nasal sinus surgery  unknown  . Cardiac catheterization  2002  . Cataract extraction Bilateral    Social History:  reports that she quit smoking about 13 years ago. Her smoking use included Cigarettes. She smoked 0.00 packs per day. She has never used smokeless tobacco. She reports that she does not drink alcohol or use illicit drugs.  No Known Allergies  Family History  Problem Relation Age of Onset  . Cancer Father     throat ca  . COPD Father   . Cancer Sister     uterine ca and breast ca  . Cancer Brother     throat     Prior to Admission medications   Medication Sig Start Date End Date Taking? Authorizing Provider  amLODipine (NORVASC) 5 MG tablet Take 5 mg by mouth daily.   Yes Historical Provider, MD  aspirin 325 MG tablet Take 325 mg by mouth daily.    Yes Historical Provider, MD  benazepril (LOTENSIN) 40 MG tablet Take 40 mg by mouth daily.   Yes Historical Provider, MD  furosemide (LASIX) 40 MG tablet Take 40 mg by mouth daily.   Yes Historical Provider, MD  glimepiride (AMARYL) 4 MG tablet Take 4 mg by mouth daily before breakfast.   Yes Historical Provider, MD  HYDROcodone-acetaminophen (NORCO/VICODIN) 5-325 MG  per tablet Take 1 tablet by mouth every 6 (six) hours as needed for pain. 05/10/13  Yes Elwyn Lade, PA-C  LORazepam (ATIVAN) 0.5 MG tablet Take 1 tablet (0.5 mg total) by mouth every 6 (six) hours as needed for anxiety. 07/04/13  Yes Marletta Lor, MD  meloxicam (MOBIC) 7.5 MG tablet Take 1 tablet (7.5 mg total) by mouth daily. 08/26/13  Yes Marletta Lor, MD  metFORMIN (GLUCOPHAGE) 1000 MG tablet Take 1 tablet (1,000 mg total)  by mouth 2 (two) times daily with a meal. 01/20/14  Yes Marletta Lor, MD  promethazine (PHENERGAN) 12.5 MG tablet Take 2 tablets (25 mg total) by mouth every 6 (six) hours as needed for nausea. 05/10/13  Yes Elwyn Lade, PA-C  simvastatin (ZOCOR) 20 MG tablet Take 20 mg by mouth every evening.   Yes Historical Provider, MD   Physical Exam: Filed Vitals:   02/04/14 1025  BP: 135/63  Pulse: 98  Temp: 98.6 F (37 C)  Resp: 17    BP 135/63  Pulse 98  Temp(Src) 98.6 F (37 C) (Oral)  Resp 17  Ht 5\' 2"  (1.575 m)  Wt 81.647 kg (180 lb)  BMI 32.91 kg/m2  SpO2 100%  General:  Appears calm and comfortable Eyes: PERRL, normal lids, irises & conjunctiva ENT: grossly normal hearing, lips & tongue Neck: no LAD, masses or thyromegaly Cardiovascular: RRR, no m/r/g. No LE edema. Telemetry: SR, no arrhythmias  Respiratory: CTA bilaterally, no w/r/r. Normal respiratory effort. Abdomen: soft, ntnd Skin: no rash or induration seen on limited exam Musculoskeletal: grossly normal tone BUE/BLE Psychiatric: grossly normal mood and affect, speech fluent and appropriate Neurologic: grossly non-focal.          Labs on Admission:  Basic Metabolic Panel:  Recent Labs Lab 01/29/14 1430 01/30/14 0500 01/31/14 0523 02/04/14 0425  NA 144 143 145 143  K 4.2 4.5 5.4* 3.9  CL 102 104 108 99  CO2 24 27 24 26   GLUCOSE 147* 114* 121* 230*  BUN 28* 25* 17 24*  CREATININE 1.53* 1.57* 1.23* 1.54*  CALCIUM 9.5 8.8 9.0 9.6   Liver Function Tests:  Recent Labs Lab 01/29/14 1430 01/30/14 0500 02/04/14 0425  AST 25 16 14   ALT 14 9 8   ALKPHOS 177* 166* 293*  BILITOT 0.2* 0.2* 0.4  PROT 8.2 6.8 7.5  ALBUMIN 4.2 3.5 3.6    Recent Labs Lab 01/29/14 1430 01/31/14 0523 02/04/14 0516  LIPASE 61* 39 66*   No results found for this basename: AMMONIA,  in the last 168 hours CBC:  Recent Labs Lab 01/29/14 1430 01/30/14 0500 02/04/14 0425  WBC 10.9* 7.4 10.1  NEUTROABS  --   --   7.1  HGB 12.2 10.3* 11.0*  HCT 37.7 32.3* 35.0*  MCV 80.0 80.5 79.5  PLT 180 133* 153   Cardiac Enzymes:  Recent Labs Lab 02/04/14 0425  TROPONINI <0.30    BNP (last 3 results) No results found for this basename: PROBNP,  in the last 8760 hours CBG:  Recent Labs Lab 01/31/14 0405 01/31/14 0826 01/31/14 1217 01/31/14 1653 02/04/14 1021  GLUCAP 111* 117* 106* 133* 212*    Radiological Exams on Admission: Dg Chest 2 View  02/04/2014   CLINICAL DATA:  Chest pain and shortness of breath. Shoulder and upper back pain.  EXAM: CHEST  2 VIEW  COMPARISON:  Chest radiograph from 01/29/2014  FINDINGS: The lungs are well-aerated and clear. There is no evidence of focal opacification,  pleural effusion or pneumothorax.  The heart is borderline normal in size. No acute osseous abnormalities are seen.  IMPRESSION: No acute cardiopulmonary process seen.   Electronically Signed   By: Garald Balding M.D.   On: 02/04/2014 05:53   Ct Angio Chest Pe W/cm &/or Wo Cm  02/04/2014   CLINICAL DATA:  Left upper chest pain  EXAM: CT ANGIOGRAPHY CHEST WITH CONTRAST  TECHNIQUE: Multidetector CT imaging of the chest was performed using the standard protocol during bolus administration of intravenous contrast. Multiplanar CT image reconstructions and MIPs were obtained to evaluate the vascular anatomy.  CONTRAST:  67mL OMNIPAQUE IOHEXOL 350 MG/ML SOLN  COMPARISON:  Prior radiograph from earlier the same day.  FINDINGS: Visualized thyroid is unremarkable.  No pathologically enlarged mediastinal, hilar, or axillary lymph nodes are identified.  Prominent atherosclerotic calcification seen within the aortic arch and within the proximal great vessels. Intrathoracic aorta is of normal caliber. Calcifications seen about the aortic valve. Mild cardiomegaly is present. Diffuse 3 vessel coronary artery calcifications present. No pericardial effusion.  Pulmonary arterial tree is well opacified. No filling defect to suggest  acute pulmonary embolism. Re-formatted imaging confirms these findings.  The lungs are clear without parenchymal consolidation, pulmonary edema, or pleural effusion. Mild subsegmental atelectasis seen dependently within the lower lobes bilaterally. 4 mm nodule seen within the right upper lobe (series 6, image 36). Additional subpleural 5 mm nodule seen within the right lower lobe (series 6, image 44). No other definite pulmonary nodules identified.  1.1 cm nodule within the medial limb of the left adrenal gland containing macroscopic fat density is most compatible with an adrenal myelolipoma (series 4, image 79). Remainder the visualized upper abdomen is unremarkable.  No acute osseous abnormality. No worrisome lytic or blastic osseous lesions.  1. No CT evidence of acute pulmonary embolism. 2. No other acute abnormality identified within the thorax. 3. Prominent atherosclerotic calcifications within the intrathoracic aorta with diffuse 3 vessel coronary artery calcifications. 4. 4 mm right upper lobe pulmonary nodule with additional 5 mm right lower lobe pulmonary nodule as above, indeterminate. If the patient is at high risk for bronchogenic carcinoma, follow-up chest CT at 6-12 months is recommended. If the patient is at low risk for bronchogenic carcinoma, follow-up chest CT at 12 months is recommended. This recommendation follows the consensus statement: Guidelines for Management of Small Pulmonary Nodules Detected on CT Scans: A Statement from the [place] as published in Radiology 2005;237:395-400. 5. 1.1 cm left adrenal myelolipoma.  Review of the MIP images confirms the above findings.   Electronically Signed   By: Jeannine Boga M.D.   On: 02/04/2014 06:56    EKG: Independently reviewed.non specific ST changes Assessment/Plan Active Problems:   Chest pain   1. Chest pain with Typical and atypical features -different pain and location from pancreatitis last admit -CTA negative for  PE -Cycle cardiac enzymes, ECHO -will ask Cards to eval, H/o Single vessel CAD on cath in 2002 -ASA 325mg  daily  2. Cystic lesion in head of pancreas -for further GI workup next month  3. 101mm RUL lung nodule -Follow up as outpatient  4. DM -SSI for now -hold metformin  5. HTN -stable, continue amlodipine -hold lasix  6. Dyslipidemia -continue statin  7. Cirrhosis -noted on imaging last admission, to be worked up further per dr.Perry  DVT proph: lovenox  Code Status: Full Code Family Communication: no family at bedside Disposition Plan: inpatient  Time spent: 60min  Cayce Paschal [facility] Pager  [phone] 

## 2014-02-04 NOTE — ED Provider Notes (Signed)
CSN: [id number]     Arrival date & time 02/04/14  0404 History   First MD Initiated Contact with Patient 02/04/14 [id number]     Chief Complaint  Patient presents with  . Chest Pain     (Consider location/radiation/quality/duration/timing/severity/associated sxs/prior Treatment) HPI 73 year old female presents to emergency apartment from home with complaint of chest pain.  Pain started last night around 6 PM.  Patient reports pain is a sharp, pressure-like sensation in the center of her chest.  She reports that she has pain with breathing.  She has had some mild shortness of breath.  Patient was discharged from the hospital last week (2/24) after having episode of epigastric pain, found to have mild pancreatitis, and mass at head of pancreas.  She has followup scheduled with GI.  She reports current pain is somewhat different in location and nature of pain that she had last week.  No n/v, no fever, no cough, no leg swelling.  Pain is worse with lying down.  No improvement from NTG, Gi cocktail, or morphine.   Past Medical History  Diagnosis Date  . DIABETES MELLITUS, TYPE II 07/13/2007  . HYPERLIPIDEMIA 12/24/2007  . HYPERTENSION 07/13/2007  . CORONARY ARTERY DISEASE 12/24/2007  . NEPHROLITHIASIS, HX OF 12/24/2007  . Tubular adenoma of colon 04/2010    on colonoscopy by Scarlette Shorts MD   Past Surgical History  Procedure Laterality Date  . Excisional hemorrhoidectomy  unknown  . Abdominal hysterectomy  unknown  . Nasal sinus surgery  unknown  . Cardiac catheterization  2002  . Cataract extraction Bilateral    Family History  Problem Relation Age of Onset  . Cancer Father     throat ca  . COPD Father   . Cancer Sister     uterine ca and breast ca  . Cancer Brother     throat   History  Substance Use Topics  . Smoking status: Former Smoker    Types: Cigarettes    Quit date: 12/08/2000  . Smokeless tobacco: Never Used  . Alcohol Use: No   OB History   Grav Para Term Preterm Abortions TAB  SAB Ect Mult Living                 Review of Systems   See History of Present Illness; otherwise all other systems are reviewed and negative  Allergies  Review of patient's allergies indicates no known allergies.  Home Medications   Current Outpatient Rx  Name  Route  Sig  Dispense  Refill  . amLODipine (NORVASC) 5 MG tablet   Oral   Take 5 mg by mouth daily.         Marland Kitchen aspirin 325 MG tablet   Oral   Take 325 mg by mouth daily.          . benazepril (LOTENSIN) 40 MG tablet   Oral   Take 40 mg by mouth daily.         . furosemide (LASIX) 40 MG tablet   Oral   Take 40 mg by mouth daily.         Marland Kitchen glimepiride (AMARYL) 4 MG tablet   Oral   Take 4 mg by mouth daily before breakfast.         . HYDROcodone-acetaminophen (NORCO/VICODIN) 5-325 MG per tablet   Oral   Take 1 tablet by mouth every 6 (six) hours as needed for pain.   6 tablet   0   . LORazepam (ATIVAN) 0.5 MG  tablet   Oral   Take 1 tablet (0.5 mg total) by mouth every 6 (six) hours as needed for anxiety.   90 tablet   1   . meloxicam (MOBIC) 7.5 MG tablet   Oral   Take 1 tablet (7.5 mg total) by mouth daily.   90 tablet   1   . metFORMIN (GLUCOPHAGE) 1000 MG tablet   Oral   Take 1 tablet (1,000 mg total) by mouth 2 (two) times daily with a meal.   180 tablet   0   . promethazine (PHENERGAN) 12.5 MG tablet   Oral   Take 2 tablets (25 mg total) by mouth every 6 (six) hours as needed for nausea.   12 tablet   0   . simvastatin (ZOCOR) 20 MG tablet   Oral   Take 20 mg by mouth every evening.          BP 146/54  Pulse 93  Temp(Src) 97.6 F (36.4 C)  Resp 17  Ht 5\' 2"  (1.575 m)  Wt 180 lb (81.647 kg)  BMI 32.91 kg/m2  SpO2 100% Physical Exam  Nursing note and vitals reviewed. Constitutional: She is oriented to person, place, and time. She appears well-developed and well-nourished. She appears distressed.  HENT:  Head: Normocephalic and atraumatic.  Right Ear: External ear  normal.  Left Ear: External ear normal.  Nose: Nose normal.  Mouth/Throat: Oropharynx is clear and moist.  Eyes: Conjunctivae and EOM are normal. Pupils are equal, round, and reactive to light.  Neck: Normal range of motion. Neck supple. No JVD present. No tracheal deviation present. No thyromegaly present.  Cardiovascular: Normal rate, regular rhythm, normal heart sounds and intact distal pulses.  Exam reveals no gallop and no friction rub.   No murmur heard. Pulmonary/Chest: Effort normal and breath sounds normal. No stridor. No respiratory distress. She has no wheezes. She has no rales. She exhibits no tenderness.  Abdominal: Soft. Bowel sounds are normal. She exhibits no distension and no mass. There is tenderness (mild epigastric tenderness, does not reproduce her chest pain). There is no rebound and no guarding.  Musculoskeletal: Normal range of motion. She exhibits no edema and no tenderness.  Lymphadenopathy:    She has no cervical adenopathy.  Neurological: She is alert and oriented to person, place, and time. She exhibits normal muscle tone. Coordination normal.  Skin: Skin is warm and dry. No rash noted. No erythema. No pallor.  Psychiatric: She has a normal mood and affect. Her behavior is normal. Judgment and thought content normal.    ED Course  Procedures (including critical care time) Labs Review Labs Reviewed  CBC WITH DIFFERENTIAL - Abnormal; Notable for the following:    Hemoglobin 11.0 (*)    HCT 35.0 (*)    MCH 25.0 (*)    All other components within normal limits  COMPREHENSIVE METABOLIC PANEL - Abnormal; Notable for the following:    Glucose, Bld 230 (*)    BUN 24 (*)    Creatinine, Ser 1.54 (*)    Alkaline Phosphatase 293 (*)    GFR calc non Af Amer 33 (*)    GFR calc Af Amer 38 (*)    All other components within normal limits  URINALYSIS, ROUTINE W REFLEX MICROSCOPIC - Abnormal; Notable for the following:    Glucose, UA >1000 (*)    Ketones, ur 15 (*)     All other components within normal limits  D-DIMER, QUANTITATIVE - Abnormal; Notable for the following:  D-Dimer, Quant 12.97 (*)    All other components within normal limits  LIPASE, BLOOD - Abnormal; Notable for the following:    Lipase 66 (*)    All other components within normal limits  TROPONIN I  URINE MICROSCOPIC-ADD ON   Imaging Review Dg Chest 2 View  02/04/2014   CLINICAL DATA:  Chest pain and shortness of breath. Shoulder and upper back pain.  EXAM: CHEST  2 VIEW  COMPARISON:  Chest radiograph from 01/29/2014  FINDINGS: The lungs are well-aerated and clear. There is no evidence of focal opacification, pleural effusion or pneumothorax.  The heart is borderline normal in size. No acute osseous abnormalities are seen.  IMPRESSION: No acute cardiopulmonary process seen.   Electronically Signed   By: Garald Balding M.D.   On: 02/04/2014 05:53   Ct Angio Chest Pe W/cm &/or Wo Cm  02/04/2014   CLINICAL DATA:  Left upper chest pain  EXAM: CT ANGIOGRAPHY CHEST WITH CONTRAST  TECHNIQUE: Multidetector CT imaging of the chest was performed using the standard protocol during bolus administration of intravenous contrast. Multiplanar CT image reconstructions and MIPs were obtained to evaluate the vascular anatomy.  CONTRAST:  62mL OMNIPAQUE IOHEXOL 350 MG/ML SOLN  COMPARISON:  Prior radiograph from earlier the same day.  FINDINGS: Visualized thyroid is unremarkable.  No pathologically enlarged mediastinal, hilar, or axillary lymph nodes are identified.  Prominent atherosclerotic calcification seen within the aortic arch and within the proximal great vessels. Intrathoracic aorta is of normal caliber. Calcifications seen about the aortic valve. Mild cardiomegaly is present. Diffuse 3 vessel coronary artery calcifications present. No pericardial effusion.  Pulmonary arterial tree is well opacified. No filling defect to suggest acute pulmonary embolism. Re-formatted imaging confirms these findings.  The  lungs are clear without parenchymal consolidation, pulmonary edema, or pleural effusion. Mild subsegmental atelectasis seen dependently within the lower lobes bilaterally. 4 mm nodule seen within the right upper lobe (series 6, image 36). Additional subpleural 5 mm nodule seen within the right lower lobe (series 6, image 44). No other definite pulmonary nodules identified.  1.1 cm nodule within the medial limb of the left adrenal gland containing macroscopic fat density is most compatible with an adrenal myelolipoma (series 4, image 79). Remainder the visualized upper abdomen is unremarkable.  No acute osseous abnormality. No worrisome lytic or blastic osseous lesions.  1. No CT evidence of acute pulmonary embolism. 2. No other acute abnormality identified within the thorax. 3. Prominent atherosclerotic calcifications within the intrathoracic aorta with diffuse 3 vessel coronary artery calcifications. 4. 4 mm right upper lobe pulmonary nodule with additional 5 mm right lower lobe pulmonary nodule as above, indeterminate. If the patient is at high risk for bronchogenic carcinoma, follow-up chest CT at 6-12 months is recommended. If the patient is at low risk for bronchogenic carcinoma, follow-up chest CT at 12 months is recommended. This recommendation follows the consensus statement: Guidelines for Management of Small Pulmonary Nodules Detected on CT Scans: A Statement from the [employer] as published in Radiology 2005;237:395-400. 5. 1.1 cm left adrenal myelolipoma.  Review of the MIP images confirms the above findings.   Electronically Signed   By: Jeannine Boga M.D.   On: 02/04/2014 06:56     EKG Interpretation   Date/Time:  Saturday February 04 2014 04:51:19 EST Ventricular Rate:  97 PR Interval:  228 QRS Duration: 97 QT Interval:  374 QTC Calculation: 475 R Axis:   93 Text Interpretation:  Sinus rhythm Prolonged PR interval Right  axis  deviation Low voltage, precordial leads  Borderline T abnormalities,  inferior leads pr increased from prior Confirmed by Jackqueline Aquilar  MD, Aliviyah Malanga  ([id number]) on 02/04/2014 5:21:53 AM      MDM   Final diagnoses:  Chest pain  Lesion of pancreas  Pancreatitis    73 year old female with chest pain.  Patient with recent hospitalization with some immobilization during that time.  She is not on any blood thinners.  She does report some pleuritic-type pain and dyspnea.  Concern for possible PE, we'll start with, d-dimer, and probably at include CT angio of the chest.  Patient has had persistent pain for over 10 hours, initial troponin is negative.  Her EKG does not have any ischemic changes.  We'll also add on lipase, for possible recurrence of her pancreatitis.  As patient reports no improvement with pain with morphine, will give a milligram of Dilaudid  7:27 AM Patient reports pain was relieved only briefly with Dilaudid.  CT angio of chest without PE despite elevated d-dimer.  Lipase is again very mildly elevated.  Unable to get pain under control.  Will discuss with hospitalist for admission.    Kalman Drape, MD 02/04/14 [phone]

## 2014-02-05 ENCOUNTER — Inpatient Hospital Stay (HOSPITAL_COMMUNITY): Payer: Medicare HMO

## 2014-02-05 DIAGNOSIS — N182 Chronic kidney disease, stage 2 (mild): Secondary | ICD-10-CM

## 2014-02-05 DIAGNOSIS — C9200 Acute myeloblastic leukemia, not having achieved remission: Secondary | ICD-10-CM

## 2014-02-05 DIAGNOSIS — R1013 Epigastric pain: Secondary | ICD-10-CM

## 2014-02-05 HISTORY — DX: Acute myeloblastic leukemia, not having achieved remission: C92.00

## 2014-02-05 LAB — BASIC METABOLIC PANEL
BUN: 16 mg/dL (ref 6–23)
CHLORIDE: 103 meq/L (ref 96–112)
CO2: 26 mEq/L (ref 19–32)
Calcium: 8.5 mg/dL (ref 8.4–10.5)
Creatinine, Ser: 1.32 mg/dL — ABNORMAL HIGH (ref 0.50–1.10)
GFR calc Af Amer: 45 mL/min — ABNORMAL LOW (ref >90)
GFR calc non Af Amer: 39 mL/min — ABNORMAL LOW (ref >90)
GLUCOSE: 176 mg/dL — AB (ref 70–99)
POTASSIUM: 4.7 meq/L (ref 3.7–5.3)
Sodium: 142 mEq/L (ref 137–147)

## 2014-02-05 LAB — GLUCOSE, CAPILLARY
Glucose-Capillary: 168 mg/dL — ABNORMAL HIGH (ref 70–99)
Glucose-Capillary: 161 mg/dL — ABNORMAL HIGH (ref 70–99)
Glucose-Capillary: 270 mg/dL — ABNORMAL HIGH (ref 70–99)
Glucose-Capillary: 87 mg/dL (ref 70–99)

## 2014-02-05 LAB — CBC
HEMATOCRIT: 31 % — AB (ref 36.0–46.0)
HEMOGLOBIN: 9.9 g/dL — AB (ref 12.0–15.0)
MCH: 25.7 pg — AB (ref 26.0–34.0)
MCHC: 31.9 g/dL (ref 30.0–36.0)
MCV: 80.5 fL (ref 78.0–100.0)
Platelets: 118 10*3/uL — ABNORMAL LOW (ref 150–400)
RBC: 3.85 MIL/uL — ABNORMAL LOW (ref 3.87–5.11)
RDW: 15.8 % — ABNORMAL HIGH (ref 11.5–15.5)
WBC: 5.8 10*3/uL (ref 4.0–10.5)

## 2014-02-05 MED ORDER — TECHNETIUM TC 99M SESTAMIBI GENERIC - CARDIOLITE
10.0000 | Freq: Once | INTRAVENOUS | Status: AC | PRN
  Administered 2014-02-05: 10 via INTRAVENOUS

## 2014-02-05 MED ORDER — TECHNETIUM TC 99M SESTAMIBI - CARDIOLITE
30.0000 | Freq: Once | INTRAVENOUS | Status: AC | PRN
  Administered 2014-02-05: 11:00:00 30 via INTRAVENOUS

## 2014-02-05 MED ORDER — REGADENOSON 0.4 MG/5ML IV SOLN
INTRAVENOUS | Status: AC
  Administered 2014-02-05: 0.4 mg via INTRAVENOUS
  Filled 2014-02-05: qty 5

## 2014-02-05 NOTE — Progress Notes (Signed)
    Subjective:  Constant chest pain, no chest change.  Objective:  Vital Signs in the last 24 hours: Temp:  [97.7 F (36.5 C)-100 F (37.8 C)] 99.2 F (37.3 C) (03/01 0549) Pulse Rate:  [103-109] 103 (03/01 0549) Resp:  [18-20] 20 (03/01 0549) BP: (121-158)/(60-77) 158/72 mmHg (03/01 0619) SpO2:  [97 %-99 %] 99 % (03/01 0549)  Intake/Output from previous day:  Intake/Output Summary (Last 24 hours) at 02/05/14 1115 Last data filed at 02/05/14 0200  Gross per 24 hour  Intake 1741.25 ml  Output      0 ml  Net 1741.25 ml    Physical Exam: General appearance: alert, cooperative and no distress Lungs: decreased at bases Heart: regular rate and rhythm and decreased heart sounds   Rate: 104  Rhythm: atrial flutter and with variable block  Lab Results:  Recent Labs  02/04/14 0425 02/05/14 0425  WBC 10.1 5.8  HGB 11.0* 9.9*  PLT 153 118*    Recent Labs  02/04/14 0425 02/05/14 0425  NA 143 142  K 3.9 4.7  CL 99 103  CO2 26 26  GLUCOSE 230* 176*  BUN 24* 16  CREATININE 1.54* 1.32*    Recent Labs  02/04/14 1125 02/04/14 1610  TROPONINI <0.30 <0.30   No results found for this basename: INR,  in the last 72 hours  Imaging: Imaging results have been reviewed  Cardiac Studies:  Assessment/Plan:   Active Problems:   Chest pain   DIABETES MELLITUS, TYPE II   CAD, non obstructive in '02   PAF- flutter   HYPERLIPIDEMIA   HYPERTENSION   Chronic renal insufficiency, stage II (mild)   Cirrhosis of liver without mention of alcohol    PLAN: Lexiscan Myoview today.  Kerin Ransom PA-C Beeper [id number] 02/05/2014, 11:15 AM   Patient seen and discussed with PA Rosalyn Gess, agree with documentation above.   1. Atypical chest pain  - she describes constant (over 24 hrs) pain in midchest with no associated symptoms  - no evidence of ACS by EKG or cardiac enzymes at this time.  - she does have a history of mild to mod CAD by cath in 2002  - negative Lexiscan  MPI for ischemia  2. Atrial arrythmias  - intermittent irregular rhythm, appears to have intermittent episodes of aflutter noted on EKG and telemetry. Normal rates.  - CHADS2Vasc score is 4. Given new diagnosis of pancreatic mass and evidence of cirrhosis by imaging, will defer anticoagulation at this time.  - will start low dose metoprolol, continue ASA.   No further cardiac testing or interventions planned at this time. Will sign off of inpatient care.    Carlyle Dolly MD

## 2014-02-05 NOTE — Progress Notes (Signed)
Patient ID: [name]  female  [id number]    DOB: [birth year]    DOA: 02/04/2014  PCP: [name], MD  Assessment/Plan:   1. Chest pain with Typical and atypical features,H/o Single vessel CAD on cath in 2002   -different pain and location from pancreatitis last admit  -CTA is negative for PE  - cardiac enzymes negative so far, ECHO pending - Cardiology consulted, recommended lexi scan myoview, pending today  - ASA 325mg  daily  - cont PPI  2. Cystic lesion in head of pancreas  -for further GI workup next month   3. 88mm RUL lung nodule  -Follow up as outpatient   4. DM  -SSI for now  -hold metformin   5. HTN  -stable, continue amlodipine  -hold lasix   6. Dyslipidemia  -continue statin   7. Cirrhosis  -noted on imaging last admission, to be worked up further per dr.Perry   DVT Prophylaxis: lovenox  Code Status:  Family Communication:  Disposition:   Consultants: Cardiologu  Procedures: NM stress test  Antibiotics: None  Subjective: States chest still hurting, eased up some with pain meds  Objective: Weight change:   Intake/Output Summary (Last 24 hours) at 02/05/14 1222 Last data filed at 02/05/14 0200  Gross per 24 hour  Intake 1741.25 ml  Output      0 ml  Net 1741.25 ml   Blood pressure 139/56, pulse 105, temperature 99.2 F (37.3 C), temperature source Oral, resp. rate 20, height 5\' 2"  (1.575 m), weight 81.647 kg (180 lb), SpO2 99.00%.  Physical Exam: General: Alert and awake, oriented x3, not in any acute distress. HEENT: anicteric sclera, PERLA, EOMI CVS: S1-S2 clear, no murmur rubs or gallops Chest: clear to auscultation bilaterally, no wheezing, rales or rhonchi Abdomen: soft nontender, nondistended, normal bowel sounds  Extremities: no cyanosis, clubbing or edema noted bilaterally Neuro: Cranial nerves II-XII intact, no focal neurological deficits  Lab Results: Basic Metabolic Panel:  Recent Labs Lab  02/04/14 0425 02/05/14 0425  NA 143 142  K 3.9 4.7  CL 99 103  CO2 26 26  GLUCOSE 230* 176*  BUN 24* 16  CREATININE 1.54* 1.32*  CALCIUM 9.6 8.5   Liver Function Tests:  Recent Labs Lab 01/30/14 0500 02/04/14 0425  AST 16 14  ALT 9 8  ALKPHOS 166* 293*  BILITOT 0.2* 0.4  PROT 6.8 7.5  ALBUMIN 3.5 3.6    Recent Labs Lab 01/31/14 0523 02/04/14 0516  LIPASE 39 66*   No results found for this basename: AMMONIA,  in the last 168 hours CBC:  Recent Labs Lab 02/04/14 0425 02/05/14 0425  WBC 10.1 5.8  NEUTROABS 7.1  --   HGB 11.0* 9.9*  HCT 35.0* 31.0*  MCV 79.5 80.5  PLT 153 118*   Cardiac Enzymes:  Recent Labs Lab 02/04/14 0425 02/04/14 1125 02/04/14 1610  TROPONINI <0.30 <0.30 <0.30   BNP: No components found with this basename: POCBNP,  CBG:  Recent Labs Lab 02/04/14 1021 02/04/14 1135 02/04/14 1655 02/04/14 2015 02/05/14 0747  GLUCAP 212* 166* 196* 196* 168*     Micro Results: No results found for this or any previous visit (from the past 240 hour(s)).  Studies/Results: Dg Chest 2 View  02/04/2014   CLINICAL DATA:  Chest pain and shortness of breath. Shoulder and upper back pain.  EXAM: CHEST  2 VIEW  COMPARISON:  Chest radiograph from 01/29/2014  FINDINGS: The lungs are well-aerated and clear. There is no evidence  of focal opacification, pleural effusion or pneumothorax.  The heart is borderline normal in size. No acute osseous abnormalities are seen.  IMPRESSION: No acute cardiopulmonary process seen.   Electronically Signed   By: Garald Balding M.D.   On: 02/04/2014 05:53   Dg Chest 2 View  01/29/2014   CLINICAL DATA:  Abdominal pain  EXAM: CHEST  2 VIEW  COMPARISON:  None  FINDINGS: The heart size and mediastinal contours appear normal. There is no pleural effusion or edema. No airspace consolidation identified. Degenerative mild to moderate degenerative disc disease is noted within the lower thoracic spine.  IMPRESSION: No active  cardiopulmonary disease.   Electronically Signed   By: Kerby Moors M.D.   On: 01/29/2014 17:06   Ct Angio Chest Pe W/cm &/or Wo Cm  02/04/2014   CLINICAL DATA:  Left upper chest pain  EXAM: CT ANGIOGRAPHY CHEST WITH CONTRAST  TECHNIQUE: Multidetector CT imaging of the chest was performed using the standard protocol during bolus administration of intravenous contrast. Multiplanar CT image reconstructions and MIPs were obtained to evaluate the vascular anatomy.  CONTRAST:  60mL OMNIPAQUE IOHEXOL 350 MG/ML SOLN  COMPARISON:  Prior radiograph from earlier the same day.  FINDINGS: Visualized thyroid is unremarkable.  No pathologically enlarged mediastinal, hilar, or axillary lymph nodes are identified.  Prominent atherosclerotic calcification seen within the aortic arch and within the proximal great vessels. Intrathoracic aorta is of normal caliber. Calcifications seen about the aortic valve. Mild cardiomegaly is present. Diffuse 3 vessel coronary artery calcifications present. No pericardial effusion.  Pulmonary arterial tree is well opacified. No filling defect to suggest acute pulmonary embolism. Re-formatted imaging confirms these findings.  The lungs are clear without parenchymal consolidation, pulmonary edema, or pleural effusion. Mild subsegmental atelectasis seen dependently within the lower lobes bilaterally. 4 mm nodule seen within the right upper lobe (series 6, image 36). Additional subpleural 5 mm nodule seen within the right lower lobe (series 6, image 44). No other definite pulmonary nodules identified.  1.1 cm nodule within the medial limb of the left adrenal gland containing macroscopic fat density is most compatible with an adrenal myelolipoma (series 4, image 79). Remainder the visualized upper abdomen is unremarkable.  No acute osseous abnormality. No worrisome lytic or blastic osseous lesions.  1. No CT evidence of acute pulmonary embolism. 2. No other acute abnormality identified within the  thorax. 3. Prominent atherosclerotic calcifications within the intrathoracic aorta with diffuse 3 vessel coronary artery calcifications. 4. 4 mm right upper lobe pulmonary nodule with additional 5 mm right lower lobe pulmonary nodule as above, indeterminate. If the patient is at high risk for bronchogenic carcinoma, follow-up chest CT at 6-12 months is recommended. If the patient is at low risk for bronchogenic carcinoma, follow-up chest CT at 12 months is recommended. This recommendation follows the consensus statement: Guidelines for Management of Small Pulmonary Nodules Detected on CT Scans: A Statement from the [place] as published in Radiology 2005;237:395-400. 5. 1.1 cm left adrenal myelolipoma.  Review of the MIP images confirms the above findings.   Electronically Signed   By: Jeannine Boga M.D.   On: 02/04/2014 06:56   US Abdomen Complete  01/29/2014   CLINICAL DATA:  Epigastric abdominal pain. Current history of diabetes and hypertension. Prior history of urinary tract calculi.  EXAM: ULTRASOUND ABDOMEN COMPLETE  COMPARISON:  CT abdomen and pelvis 02/13/2007.  FINDINGS: Gallbladder:  No shadowing gallstones or echogenic sludge. No gallbladder wall thickening or pericholecystic fluid. Negative sonographic Percell Miller  sign according to the ultrasound technologist.  Common bile duct:  Diameter: Approximately 4 mm diameter.  Liver:  Normal size and echotexture without focal parenchymal abnormality. Patent portal vein.  IVC:  Patent.  Pancreas:  Thickened, heterogeneous head and proximal body. Distal body and tail obscured by overlying bowel gas.  Spleen:  Borderline enlarged measuring approximately 13.1 x 5.1 x 12.6 cm, yielding a volume of approximately 420 cc. No focal splenic parenchymal abnormality.  Right Kidney:  Length: Approximately 10.7 cm. Mild diffuse cortical thinning consistent with age. No hydronephrosis. No focal parenchymal abnormality. No visible shadowing calculi.  Left  Kidney:  Length: Approximately 11.3 cm. Mild cortical thinning involving the upper pole with well-preserved cortex elsewhere. No hydronephrosis. No focal parenchymal abnormality. No visible shadowing calculi.  Abdominal aorta:  Normal in caliber throughout its visualized course in the abdomen with evidence of atherosclerosis. Obscured distally by overlying bowel gas. Maximum diameter 2.4 cm.  Other findings:  None.  IMPRESSION: 1. Thickened, heterogeneous pancreatic head and proximal body. Is there clinical evidence of pancreatitis? Distal body and tail were obscured by overlying bowel gas. 2. Borderline splenomegaly without focal splenic parenchymal abnormality. 3. Normal-appearing gallbladder.  No biliary ductal dilation.   Electronically Signed   By: Evangeline Dakin M.D.   On: 01/29/2014 16:46   Ct Abdomen Pelvis W Contrast  01/29/2014   CLINICAL DATA:  Abdominal and pelvic pain with nausea.  EXAM: CT ABDOMEN AND PELVIS WITH CONTRAST  TECHNIQUE: Multidetector CT imaging of the abdomen and pelvis was performed using the standard protocol following bolus administration of intravenous contrast.  CONTRAST:  157mL OMNIPAQUE IOHEXOL 300 MG/ML  SOLN  COMPARISON:  02/13/2007 CT and 01/29/2014 ultrasound  FINDINGS: The liver has a slightly nodular contour suspicious for cirrhosis.  The spleen is upper limits of normal in size.  Mild to moderate bilateral renal cortical thinning is noted.  Adrenal glands are unremarkable except for at a stable small left adrenal myelolipoma.  There has been interval development of a 1.1 x 1.5 cm cystic structure within the pancreatic head (image number 33). There is no evidence of pancreatic ductal dilatation or the pancreatic abnormality.  There is no evidence of abdominal aortic aneurysm, biliary dilatation or worrisome enlarged lymph nodes.  There are few upper normal caliber small bowel loops within the mid abdomen containing gas and some fluid - nonspecific. There is no evidence  of bowel obstruction, pneumoperitoneum, or abscess.  Remainder of the bowel and appendix are unremarkable.  The bladder is within normal limits.  The patient is status post hysterectomy.  No acute or suspicious bony abnormalities are noted. Degenerative changes within the lumbar spine are again noted. Marland Kitchen  IMPRESSION: Nonspecific nonobstructive bowel gas pattern with upper limits of normal caliber gas and fluid filled small bowel in the mid abdomen. This probably represents a mild ileus.  1.1 x 1.5 cm pancreatic head cystic structure - new since 2008. MR followup in 1 year is recommended to evaluate stability. This may represent an indolent cystic lesion or IPMN.  Probable cirrhosis.  Upper limits of normal spleen size.   Electronically Signed   By: [name] M.D.   On: 01/29/2014 20:49   Mr 3d Recon At Scanner  01/31/2014   CLINICAL DATA:  One-day history of abdominal pain in the epigastric region radiating to the back.  EXAM: MR 3D RECON AT SCANNER; MR ABDOMEN WO/W CM MRCP  TECHNIQUE: Multiplanar multisequence MR imaging of the abdomen was performed, including heavily T2-weighted  images of the biliary and pancreatic ducts. Three-dimensional MR images were rendered by post processing of the original MR data.  CONTRAST:  28mL MULTIHANCE GADOBENATE DIMEGLUMINE 529 MG/ML IV SOLN  COMPARISON:  CT ABD/PELVIS W CM dated 01/29/2014  FINDINGS: Liver has a lobular contour with enlarged caudate lobe suggesting underlying cirrhosis. No evidence of underlying architectural distortion. Portal veins are patent. No evidence ascites. No significant periportal lymphadenopathy. Gallbladder is normal. There is no intrahepatic biliary duct dilatation. The common hepatic duct and common bile duct are normal caliber.  There is a small multilobular cystic lesion in the head of the pancreas which is immediately adjacent to the pancreatic duct. This lesion measures 17 mm x 9 mm by 13 mm (image 21, series 5). No evidence of dilatation of the  pancreas hepatic duct upstream or downstream from this lesion. The pancreatic parenchyma otherwise appears normal. The pancreatic duct is normal through the body and tail. No evidence of enhancement of the cystic lesion in the head of the pancreas. No enhancing lesions throughout the body of the pancreas. No enhancing hepatic lesion.  Spleen is mildly enlarged at 580 cubic cm. The splenic vein is patent. There is thickening of the left adrenal gland which has loss of signal intensity on the post phase imaging consistent with a benign adrenal adenoma. Within the left adrenal gland is a rounded 8 mm lesion (image 47, series 1300) consistent with a small myelolipoma. Kidneys are normal. No abdominal adenopathy.  IMPRESSION: 1. Morphologic changes of early cirrhosis. No evidence architectural distortion or significant portal hypertension. 2. Mild splenomegaly. 3. Cystic lesion in the head of the pancreas may well communicate with the pancreatic duct. Differential considerations include IPMT and post inflammatory cystic change. Recommend followup MRI without with contrast in 12 months for re-evaluation. This recommendation follows ACR consensus guidelines: Managing Incidental Findings on Abdominal CT: White Paper of the ACR Incidental Findings Committee. J Am Coll Radiol 2010;7:754-773 4. Benign adenoma and myelolipoma of the left adrenal gland.   Electronically Signed   By: Suzy Bouchard M.D.   On: 01/31/2014 13:00   Mr Abd W/wo Cm/mrcp  01/31/2014   CLINICAL DATA:  One-day history of abdominal pain in the epigastric region radiating to the back.  EXAM: MR 3D RECON AT SCANNER; MR ABDOMEN WO/W CM MRCP  TECHNIQUE: Multiplanar multisequence MR imaging of the abdomen was performed, including heavily T2-weighted images of the biliary and pancreatic ducts. Three-dimensional MR images were rendered by post processing of the original MR data.  CONTRAST:  84mL MULTIHANCE GADOBENATE DIMEGLUMINE 529 MG/ML IV SOLN   COMPARISON:  CT ABD/PELVIS W CM dated 01/29/2014  FINDINGS: Liver has a lobular contour with enlarged caudate lobe suggesting underlying cirrhosis. No evidence of underlying architectural distortion. Portal veins are patent. No evidence ascites. No significant periportal lymphadenopathy. Gallbladder is normal. There is no intrahepatic biliary duct dilatation. The common hepatic duct and common bile duct are normal caliber.  There is a small multilobular cystic lesion in the head of the pancreas which is immediately adjacent to the pancreatic duct. This lesion measures 17 mm x 9 mm by 13 mm (image 21, series 5). No evidence of dilatation of the pancreas hepatic duct upstream or downstream from this lesion. The pancreatic parenchyma otherwise appears normal. The pancreatic duct is normal through the body and tail. No evidence of enhancement of the cystic lesion in the head of the pancreas. No enhancing lesions throughout the body of the pancreas. No enhancing hepatic lesion.  Spleen is mildly enlarged at 580 cubic cm. The splenic vein is patent. There is thickening of the left adrenal gland which has loss of signal intensity on the post phase imaging consistent with a benign adrenal adenoma. Within the left adrenal gland is a rounded 8 mm lesion (image 47, series 1300) consistent with a small myelolipoma. Kidneys are normal. No abdominal adenopathy.  IMPRESSION: 1. Morphologic changes of early cirrhosis. No evidence architectural distortion or significant portal hypertension. 2. Mild splenomegaly. 3. Cystic lesion in the head of the pancreas may well communicate with the pancreatic duct. Differential considerations include IPMT and post inflammatory cystic change. Recommend followup MRI without with contrast in 12 months for re-evaluation. This recommendation follows ACR consensus guidelines: Managing Incidental Findings on Abdominal CT: White Paper of the ACR Incidental Findings Committee. J Am Coll Radiol  2010;7:754-773 4. Benign adenoma and myelolipoma of the left adrenal gland.   Electronically Signed   By: Suzy Bouchard M.D.   On: 01/31/2014 13:00    Medications: Scheduled Meds: . amLODipine  5 mg Oral Daily  . aspirin  325 mg Oral Daily  . enoxaparin (LOVENOX) injection  40 mg Subcutaneous Q24H  . insulin aspart  0-9 Units Subcutaneous TID WC  . metoprolol tartrate  12.5 mg Oral BID  . pantoprazole  40 mg Oral Q1200  . simvastatin  20 mg Oral QPM      LOS: 1 day   Gaelle Adriance M.D. [facility] 02/05/2014, 12:22 PM Pager: [phone]  If 7PM-7AM, please contact night-coverage [email or web address] Password [email or web address]

## 2014-02-06 ENCOUNTER — Encounter: Payer: Self-pay | Admitting: *Deleted

## 2014-02-06 ENCOUNTER — Encounter: Payer: Self-pay | Admitting: Gastroenterology

## 2014-02-06 ENCOUNTER — Telehealth: Payer: Self-pay | Admitting: Internal Medicine

## 2014-02-06 DIAGNOSIS — R0789 Other chest pain: Principal | ICD-10-CM

## 2014-02-06 DIAGNOSIS — I2510 Atherosclerotic heart disease of native coronary artery without angina pectoris: Secondary | ICD-10-CM

## 2014-02-06 DIAGNOSIS — I369 Nonrheumatic tricuspid valve disorder, unspecified: Secondary | ICD-10-CM

## 2014-02-06 LAB — GLUCOSE, CAPILLARY
GLUCOSE-CAPILLARY: 178 mg/dL — AB (ref 70–99)
GLUCOSE-CAPILLARY: 133 mg/dL — AB (ref 70–99)

## 2014-02-06 LAB — LIPASE, BLOOD: Lipase: 48 U/L (ref 11–59)

## 2014-02-06 LAB — AMYLASE: Amylase: 50 U/L (ref 0–105)

## 2014-02-06 MED ORDER — NITROGLYCERIN 0.4 MG SL SUBL: 0.4000 mg | SUBLINGUAL_TABLET | SUBLINGUAL | Status: DC | PRN

## 2014-02-06 MED ORDER — PANTOPRAZOLE SODIUM 40 MG PO TBEC: 40.0000 mg | DELAYED_RELEASE_TABLET | Freq: Every day | ORAL | Status: DC

## 2014-02-06 MED ORDER — HYDROCODONE-ACETAMINOPHEN 5-325 MG PO TABS: 1.0000 | ORAL_TABLET | Freq: Four times a day (QID) | ORAL | Status: DC | PRN

## 2014-02-06 MED ORDER — METOPROLOL TARTRATE 25 MG PO TABS: ORAL_TABLET | ORAL | Status: DC

## 2014-02-06 MED ORDER — METOPROLOL TARTRATE 25 MG PO TABS: 12.5000 mg | ORAL_TABLET | Freq: Two times a day (BID) | ORAL | Status: DC

## 2014-02-06 NOTE — Telephone Encounter (Signed)
referal to Gastroenterology she has [facility] and an appointment scheduled for Thursday. Dr. Alonza Bogus

## 2014-02-06 NOTE — Discharge Summary (Signed)
Physician Discharge Summary  Patient ID: ZILLIE LEGE MRN: [id number] DOB/AGE: 02-04-1941 73 y.o.  Admit date: 02/04/2014 Discharge date: 02/06/2014  Primary Care Physician:  [name], MD  Discharge Diagnoses:    . atypical Chest pain . PAF- flutter anticoagulation deferred at this time  . Chronic renal insufficiency, stage II (mild) . Cirrhosis of liver without mention of alcohol . CAD, non obstructive in '02 . HYPERTENSION . HYPERLIPIDEMIA . DIABETES MELLITUS, TYPE II  Consults:  Cardiology, Dr. Harl Bowie   Recommendations for Outpatient Follow-up:  Patient was admitted to followup with gastroenterology, appointment was made for next week.  Allergies:  No Known Allergies   Discharge Medications:   Medication List    STOP taking these medications       benazepril 40 MG tablet  Commonly known as:  LOTENSIN     furosemide 40 MG tablet  Commonly known as:  LASIX      TAKE these medications       amLODipine 5 MG tablet  Commonly known as:  NORVASC  Take 5 mg by mouth daily.     aspirin 325 MG tablet  Take 325 mg by mouth daily.     glimepiride 4 MG tablet  Commonly known as:  AMARYL  Take 4 mg by mouth daily before breakfast.     HYDROcodone-acetaminophen 5-325 MG per tablet  Commonly known as:  NORCO/VICODIN  Take 1 tablet by mouth every 6 (six) hours as needed for moderate pain or severe pain.     LORazepam 0.5 MG tablet  Commonly known as:  ATIVAN  Take 1 tablet (0.5 mg total) by mouth every 6 (six) hours as needed for anxiety.     meloxicam 7.5 MG tablet  Commonly known as:  MOBIC  Take 1 tablet (7.5 mg total) by mouth daily.     metFORMIN 1000 MG tablet  Commonly known as:  GLUCOPHAGE  Take 1 tablet (1,000 mg total) by mouth 2 (two) times daily with a meal.     metoprolol tartrate 25 MG tablet  Commonly known as:  LOPRESSOR  Take metoprolol 12.5 mg (split 25mg  tab to half) oral BID (twice a day).     nitroGLYCERIN 0.4 MG SL  tablet  Commonly known as:  NITROSTAT  Place 1 tablet (0.4 mg total) under the tongue every 5 (five) minutes as needed for chest pain.     pantoprazole 40 MG tablet  Commonly known as:  PROTONIX  Take 1 tablet (40 mg total) by mouth daily.     promethazine 12.5 MG tablet  Commonly known as:  PHENERGAN  Take 2 tablets (25 mg total) by mouth every 6 (six) hours as needed for nausea.     simvastatin 20 MG tablet  Commonly known as:  ZOCOR  Take 20 mg by mouth every evening.         Brief H and P: For complete details please refer to admission H and P, but in brief [name] is a 73 y.o. female with PMH of DM, Single vessel CAD on cath in 2002, HTN, Dyslipidemia, recently discharged from hospital after evaluation for cystic lesion in head of pancreas and mild pancreatitis, she is supposed to FU with GI next month for further workup for this.  After discharge home next day started experiencing substernal/upper chest pressure, heaviness radiating up to her shoulders, arms. This was ongoing for 1-2 days now and hence presented to the ER.  No nausea or Vomiting, no bowel disturbances,  no fevers or chills.  In ER, had elevated D-dimer hence had CTA chest which was negative for PE, diffuse coronary calcifications   Hospital Course:   1. Chest pain with Typical and atypical features,H/o Single vessel CAD on cath in 2002 . Patient reported as different pain and location from pancreatitis last admit. CT angiogram was negative for pulmonary embolism. Cardiac enzymes remained negative. Cardiology was consulted. Patient underwent lexiscan stress test which showed no evidence of prior infarction or pharmacologically induced ischemia EF 63%. 2-D echo showed EF of 55-60%, mild LVF. Patient was continued on PPI and aspirin. Amylase and lipase were normal. Cardiology signed off and recommended no further cardiac workup.  2. Cystic lesion in head of pancreas: Patient has scheduled a followup appointment  with Dr. Henrene Pastor. However the patient continued to atypical GI symptoms, I made her appointment with [facility], Ms Zehr 02/09/14.  3. 47mm RUL lung nodule -Follow up as outpatient   4. DM: continue oral hypoglycemics  5. HTN -stable, continue amlodipine  6. Dyslipidemia -continue statin   7. Cirrhosis  -noted on imaging last admission, to be worked up further per Dr.Perry    Day of Discharge BP 137/68  Pulse 98  Temp(Src) 98.2 F (36.8 C) (Oral)  Resp 18  Ht 5\' 2"  (1.575 m)  Wt 81.647 kg (180 lb)  BMI 32.91 kg/m2  SpO2 98%  Physical Exam: General: Alert and awake oriented x3 not in any acute distress. CVS: S1-S2 clear no murmur rubs or gallops Chest: clear to auscultation bilaterally, no wheezing rales or rhonchi Abdomen: soft nontender, nondistended, normal bowel sounds Extremities: no cyanosis, clubbing or edema noted bilaterally Neuro: Cranial nerves II-XII intact, no focal neurological deficits   The results of significant diagnostics from this hospitalization (including imaging, microbiology, ancillary and laboratory) are listed below for reference.    LAB RESULTS: Basic Metabolic Panel:  Recent Labs Lab 02/04/14 0425 02/05/14 0425  NA 143 142  K 3.9 4.7  CL 99 103  CO2 26 26  GLUCOSE 230* 176*  BUN 24* 16  CREATININE 1.54* 1.32*  CALCIUM 9.6 8.5   Liver Function Tests:  Recent Labs Lab 02/04/14 0425  AST 14  ALT 8  ALKPHOS 293*  BILITOT 0.4  PROT 7.5  ALBUMIN 3.6    Recent Labs Lab 02/04/14 0516 02/06/14 0855  LIPASE 66* 48  AMYLASE  --  50   No results found for this basename: AMMONIA,  in the last 168 hours CBC:  Recent Labs Lab 02/04/14 0425 02/05/14 0425  WBC 10.1 5.8  NEUTROABS 7.1  --   HGB 11.0* 9.9*  HCT 35.0* 31.0*  MCV 79.5 80.5  PLT 153 118*   Cardiac Enzymes:  Recent Labs Lab 02/04/14 1125 02/04/14 1610  TROPONINI <0.30 <0.30   BNP: No components found with this basename: POCBNP,   CBG:  Recent Labs Lab 02/05/14 2046 02/06/14 0744  GLUCAP 87 178*    Significant Diagnostic Studies:  Dg Chest 2 View  02/04/2014   CLINICAL DATA:  Chest pain and shortness of breath. Shoulder and upper back pain.  EXAM: CHEST  2 VIEW  COMPARISON:  Chest radiograph from 01/29/2014  FINDINGS: The lungs are well-aerated and clear. There is no evidence of focal opacification, pleural effusion or pneumothorax.  The heart is borderline normal in size. No acute osseous abnormalities are seen.  IMPRESSION: No acute cardiopulmonary process seen.   Electronically Signed   By: Garald Balding M.D.   On: 02/04/2014 05:53  Ct Angio Chest Pe W/cm &/or Wo Cm  02/04/2014   CLINICAL DATA:  Left upper chest pain  EXAM: CT ANGIOGRAPHY CHEST WITH CONTRAST  TECHNIQUE: Multidetector CT imaging of the chest was performed using the standard protocol during bolus administration of intravenous contrast. Multiplanar CT image reconstructions and MIPs were obtained to evaluate the vascular anatomy.  CONTRAST:  3mL OMNIPAQUE IOHEXOL 350 MG/ML SOLN  COMPARISON:  Prior radiograph from earlier the same day.  FINDINGS: Visualized thyroid is unremarkable.  No pathologically enlarged mediastinal, hilar, or axillary lymph nodes are identified.  Prominent atherosclerotic calcification seen within the aortic arch and within the proximal great vessels. Intrathoracic aorta is of normal caliber. Calcifications seen about the aortic valve. Mild cardiomegaly is present. Diffuse 3 vessel coronary artery calcifications present. No pericardial effusion.  Pulmonary arterial tree is well opacified. No filling defect to suggest acute pulmonary embolism. Re-formatted imaging confirms these findings.  The lungs are clear without parenchymal consolidation, pulmonary edema, or pleural effusion. Mild subsegmental atelectasis seen dependently within the lower lobes bilaterally. 4 mm nodule seen within the right upper lobe (series 6, image 36).  Additional subpleural 5 mm nodule seen within the right lower lobe (series 6, image 44). No other definite pulmonary nodules identified.  1.1 cm nodule within the medial limb of the left adrenal gland containing macroscopic fat density is most compatible with an adrenal myelolipoma (series 4, image 79). Remainder the visualized upper abdomen is unremarkable.  No acute osseous abnormality. No worrisome lytic or blastic osseous lesions.  1. No CT evidence of acute pulmonary embolism. 2. No other acute abnormality identified within the thorax. 3. Prominent atherosclerotic calcifications within the intrathoracic aorta with diffuse 3 vessel coronary artery calcifications. 4. 4 mm right upper lobe pulmonary nodule with additional 5 mm right lower lobe pulmonary nodule as above, indeterminate. If the patient is at high risk for bronchogenic carcinoma, follow-up chest CT at 6-12 months is recommended. If the patient is at low risk for bronchogenic carcinoma, follow-up chest CT at 12 months is recommended. This recommendation follows the consensus statement: Guidelines for Management of Small Pulmonary Nodules Detected on CT Scans: A Statement from the [employer] as published in Radiology 2005;237:395-400. 5. 1.1 cm left adrenal myelolipoma.  Review of the MIP images confirms the above findings.   Electronically Signed   By: Jeannine Boga M.D.   On: 02/04/2014 06:56   Nm Myocar Multi W/spect W/wall Motion / Ef  02/05/2014   CLINICAL DATA:  Chest pain, history of diabetes and CAD  EXAM: MYOCARDIAL IMAGING WITH SPECT (REST AND PHARMACOLOGIC-STRESS)  GATED LEFT VENTRICULAR WALL MOTION STUDY  LEFT VENTRICULAR EJECTION FRACTION  TECHNIQUE: Standard myocardial SPECT imaging was performed after resting intravenous injection of 10 mCi Tc-79m sestamibi. Subsequently, intravenous infusion of Lexiscan was performed under the supervision of the Cardiology staff. At peak effect of the drug, 30 mCi Tc-13m sestamibi was  injected intravenously and standard myocardial SPECT imaging was performed. Quantitative gated imaging was also performed to evaluate left ventricular wall motion, and estimate left ventricular ejection fraction.  COMPARISON:  CT ANGIO CHEST W/CM &/OR WO/CM dated 02/04/2014; DG CHEST 2 VIEW dated 02/04/2014  FINDINGS: Review of the rotational raw images demonstrates significant breast and chest wall attenuation on both the provided rest and stress images. Mild GI activity is seen on both the provided rest and stress images. No significant patient motion artifact.  SPECT imaging demonstrates there is a very minimal amount of matched attenuation involving the left  ventricular apex which is without associated wall motion abnormality. No scintigraphic evidence of prior infarction or pharmacologically induced ischemia.  Quantitative gated analysis demonstrates normal wall motion.  The resting left ventricular ejection fraction is [id number] with end-diastolic volume of 57 ml and end-systolic volume of 21 ml.  IMPRESSION: 1. No scintigraphic evidence of prior infarction or pharmacologically induced ischemia. 2. Normal wall motion.  Ejection fraction is 63%.   Electronically Signed   By: Sandi Mariscal M.D.   On: 02/05/2014 12:34    2D ECHO: Study Conclusions  - Left ventricle: The cavity size was normal. Wall thickness was increased in a pattern of mild LVH. Systolic function was normal. The estimated ejection fraction was in the range of 55% to 60%. - Aortic valve: Small gradient across valve with no significant stenosis Valve area: 1.37cm^2(VTI). Valve area: 1.44cm^2 (Vmax). - Mitral valve: Calcification of anterior leaflet - Left atrium: The atrium was mildly dilated. - Atrial septum: No defect or patent foramen ovale was identified.    Disposition and Follow-up:     Discharge Orders   Future Appointments Provider Department Dept Phone   02/09/2014 2:30 PM Laban Emperor. Zehr, [facility] [phone]   03/21/2014 11:00 AM Irene Shipper, MD [facility] [phone]   Future Orders Complete By Expires   Diet Carb Modified  As directed    Increase activity slowly  As directed        DISPOSITION: home DIET: carb modified diet   DISCHARGE FOLLOW-UP Follow-up Information   Follow up with ZEHR, JESSICA D., PA-C On 02/09/2014. (at 2:30PM )    Specialty:  Gastroenterology   Contact information:   [address] [place] [address] [phone]       Follow up with [name], MD. Schedule an appointment as soon as possible for a visit in 2 weeks. (for hospital follow-up)    Specialty:  Internal Medicine   Contact information:   [place] [address] [phone]       Time spent on Discharge: 40 mins  Signed:   Kilyn Maragh M.D. [facility] 02/06/2014, 10:47 AM Pager: [phone]

## 2014-02-07 NOTE — Telephone Encounter (Signed)
Pt is being seen at [facility], which is within the [facility] network. No referral is required.

## 2014-02-09 ENCOUNTER — Encounter: Payer: Self-pay | Admitting: Gastroenterology

## 2014-02-09 ENCOUNTER — Ambulatory Visit (INDEPENDENT_AMBULATORY_CARE_PROVIDER_SITE_OTHER): Payer: Medicare HMO | Admitting: Gastroenterology

## 2014-02-09 VITALS — BP 130/60 | HR 80 | Ht 62.0 in | Wt 180.0 lb

## 2014-02-09 DIAGNOSIS — M549 Dorsalgia, unspecified: Secondary | ICD-10-CM

## 2014-02-09 DIAGNOSIS — K869 Disease of pancreas, unspecified: Secondary | ICD-10-CM

## 2014-02-09 DIAGNOSIS — K7460 Unspecified cirrhosis of liver: Secondary | ICD-10-CM

## 2014-02-09 NOTE — Patient Instructions (Addendum)
Please keep your appointment with Dr. Henrene Pastor on 03-21-2014 at 11 am.  Please follow up with your primary care doctor

## 2014-02-10 ENCOUNTER — Encounter: Payer: Self-pay | Admitting: Gastroenterology

## 2014-02-13 ENCOUNTER — Other Ambulatory Visit: Payer: Self-pay | Admitting: Cardiology

## 2014-02-13 DIAGNOSIS — M549 Dorsalgia, unspecified: Secondary | ICD-10-CM

## 2014-02-13 DIAGNOSIS — G8929 Other chronic pain: Secondary | ICD-10-CM | POA: Insufficient documentation

## 2014-02-13 NOTE — Progress Notes (Signed)
02/13/2014 KALIANNE DAVIDSON [id number] 10-May-1941   HISTORY OF PRESENT ILLNESS:  This is a pleasant 73 year old female who comes to our office today for hospital followup. She is known to Dr. Henrene Pastor in the past for previous colonoscopy. Her last colonoscopy was May 2011 at which time she was found to have 2 polyps which were removed from the descending colon as well as AV malformations in the cecum and descending colon, and small internal hemorrhoids. One of the polyps was a tubular adenoma and the other was a serrated adenoma with no high-grade dysplasia seen.  She was put in for a 5 year colonoscopy recall.  She was recently hospitalized with complaints of pelvic pain, abdominal pain, and chest pain.  She underwent extensive evaluation while hospitalized including an ultrasound of the abdomen which revealed normal gallbladder and bile ducts, but with thickened heterogeneous pancreatic head and proximal body with some borderline splenomegaly without focal splenic parenchymal abnormalities. CT scan of the abdomen and pelvis with contrast showed nonspecific nonobstructive bowel gas pattern with upper limits of normal caliber gas and fluid-filled small bowel in the midabdomen possibly representing an ileus. It also showed probable cirrhosis with upper limits of normal spleen size and a 1.1 x 1.5 cm pancreatic head cystic structure new since 2008 and it was recommended MRI followup in one year to evaluate stability. MRI was then performed, however, and showed morphologic changes consistent with early cirrhosis with no evidence of architectural distortion or significant portal hypertension, mild splenomegaly, cystic lesion in the head of the pancreas that may well communicate the pancreatic duct with differential including IPMN and postinflammatory cystic change. Once again repeat MRI was recommended in 12 months for further evaluation. She actually has followup scheduled in April with Dr. Henrene Pastor to discuss the cirrhosis  and the pancreatic lesion in further detail, but was scheduled for this appointment today due to the complaints of abdominal and chest pain issues that she was having.  Cardiac work-up with ECHO, etc was also unremarkable.  In regards to the new diagnosis of cirrhosis, she did have extensive serological evaluation in the hospital for that as well. ANA, AMA, anti-smooth muscle antibody, hepatitis B surface antigen and antibody, and hepatitis C antibody were all negative/normal.  Denies ETOH use.  CA 19-9 was actually low at 9.7.  She comes in today stating that her abdominal pain and chest pain have completely resolved and she has had no further issues since her hospitalizations. She is now complaining only of back pain and says that she has pain deep inside, but it also hurts more superficial like when she had shingles in the past. She says that she had a couple of lesions on her chest earlier.  She denies absolutely any other GI complaints at this time.  She is on pantoprazole 40 mg daily, which I believe was started in the hospital, but denies having any complaints of heartburn/reflux.  She is a rather poor historian.   Past Medical History  Diagnosis Date  . DIABETES MELLITUS, TYPE II 07/13/2007  . HYPERLIPIDEMIA 12/24/2007  . HYPERTENSION 07/13/2007  . CORONARY ARTERY DISEASE 12/24/2007  . NEPHROLITHIASIS, HX OF 12/24/2007  . Colon polyps 04/29/2010    TUBULAR ADENOMA AND A SERRATED ADENOMA  . Acute pancreatitis   . Unspecified disease of pancreas   . Chronic kidney disease, stage II (mild)    Past Surgical History  Procedure Laterality Date  . Excisional hemorrhoidectomy  unknown  . Abdominal hysterectomy  unknown  . Nasal  sinus surgery  unknown  . Cardiac catheterization  2002  . Cataract extraction Bilateral     reports that she quit smoking about 13 years ago. Her smoking use included Cigarettes. She smoked 0.00 packs per day. She has never used smokeless tobacco. She reports that she  does not drink alcohol or use illicit drugs. family history includes COPD in her father; Cancer in her brother, father, and sister. No Known Allergies    Outpatient Encounter Prescriptions as of 02/09/2014  Medication Sig  . amLODipine (NORVASC) 5 MG tablet Take 5 mg by mouth daily.  Marland Kitchen aspirin 325 MG tablet Take 325 mg by mouth daily.   Marland Kitchen glimepiride (AMARYL) 4 MG tablet Take 4 mg by mouth daily before breakfast.  . HYDROcodone-acetaminophen (NORCO/VICODIN) 5-325 MG per tablet Take 1 tablet by mouth every 6 (six) hours as needed for moderate pain or severe pain.  Marland Kitchen LORazepam (ATIVAN) 0.5 MG tablet Take 1 tablet (0.5 mg total) by mouth every 6 (six) hours as needed for anxiety.  . meloxicam (MOBIC) 7.5 MG tablet Take 1 tablet (7.5 mg total) by mouth daily.  . metFORMIN (GLUCOPHAGE) 1000 MG tablet Take 1 tablet (1,000 mg total) by mouth 2 (two) times daily with a meal.  . metoprolol tartrate (LOPRESSOR) 25 MG tablet Take metoprolol 12.5 mg (split 25mg  tab to half) oral BID (twice a day).  . nitroGLYCERIN (NITROSTAT) 0.4 MG SL tablet Place 1 tablet (0.4 mg total) under the tongue every 5 (five) minutes as needed for chest pain.  . pantoprazole (PROTONIX) 40 MG tablet Take 1 tablet (40 mg total) by mouth daily.  . promethazine (PHENERGAN) 12.5 MG tablet Take 2 tablets (25 mg total) by mouth every 6 (six) hours as needed for nausea.  . simvastatin (ZOCOR) 20 MG tablet Take 20 mg by mouth every evening.     REVIEW OF SYSTEMS  : All other systems reviewed and negative except where noted in the History of Present Illness.   PHYSICAL EXAM: BP 130/60  Pulse 80  Ht 5\' 2"  (1.575 m)  Wt 180 lb (81.647 kg)  BMI 32.91 kg/m2 General: Well developed white female in no acute distress Head: Normocephalic and atraumatic Eyes:  Sclerae anicteric, conjunctiva pink. Ears: Normal auditory acuity. Lungs: Clear throughout to auscultation Heart: Regular rate and rhythm Abdomen: Soft, non-distended.  Normal  bowel sounds.  Non-tender. Musculoskeletal: Symmetrical with no gross deformities  Skin: No lesions on visible extremities.  No rashes noted. Extremities: No edema  Neurological: Alert oriented x 4, grossly non-focal. Psychological:  Alert and cooperative. Normal mood and affect  ASSESSMENT AND PLAN: -Pelvic, abdominal, and chest pain:  Now resolved.  Unsure as to the cause of these symptoms as extensive evaluation has not shown any pain-causing abnormalities.  Could consider EGD.  She is on pantoprazole 40 mg daily and should continue that. -Back pain:  Her only complaint today.  Says that it feels like when she had shingles previously.  Says that she had a couple of small lesions on her chest earlier as well.  Needs to follow-up with PCP regarding this complaint. -Cirrhosis, early changes seen on imaging:  New diagnosis.  Possibly secondary to NASH with negative serologic evaluation at this point.  Denies ETOH.  ALP elevated at 293 with other LFT's normal. -Pancreatic lesion:  ? Observation and repeat imaging in a year vs EUS.  *She already has an appointment with Dr. Henrene Pastor in April and should keep that appointment for any further discussion/evaluation.

## 2014-02-14 NOTE — Progress Notes (Signed)
Agree with assessment and plans as outlined 

## 2014-02-20 ENCOUNTER — Telehealth: Payer: Self-pay | Admitting: Internal Medicine

## 2014-02-20 NOTE — Telephone Encounter (Signed)
Patient Information:  Caller Name: Donice  Phone: [phone]  Patient: [name], [name]  Gender: Female  DOB: 11-03-1941  Age: 73 Years  PCP: Bluford Kaufmann (Family Practice > 61yrs old)  Office Follow Up:  Does the office need to follow up with this patient?: No  Instructions For The Office: N/A   Symptoms  Reason For Call & Symptoms: Pt is calling with shoulder pain. Onset Sat am. Pt states it hurts in her shoulder joint. No injury.  Reviewed Health History In EMR: Yes  Reviewed Medications In EMR: Yes  Reviewed Allergies In EMR: Yes  Reviewed Surgeries / Procedures: Yes  Date of Onset of Symptoms: 02/18/2014  Treatments Tried: hydrocodone prn  Treatments Tried Worked: No  Guideline(s) Used:  Shoulder Pain  Disposition Per Guideline:   Go to ED Now  Reason For Disposition Reached:   Age > 19 and no obvious cause and pain even when not moving the arm (Exception: pain is clearly made worse by moving arm or bending neck)  Advice Given:  N/A  Patient Refused Recommendation:  Patient Refused Care Advice  RN called office d/t ED disposition. office recommended ED. Pt refused "I've already been there 3x in the past month/had x-rays, etc. " of course not of the shoulder but...pt refuses to go. RN advised her Dr. Raliegh Ip not in today.

## 2014-02-22 ENCOUNTER — Emergency Department (HOSPITAL_COMMUNITY)
Admission: EM | Admit: 2014-02-22 | Discharge: 2014-02-22 | Disposition: A | Discharge status: Home / Self Care | Payer: Medicare HMO | Attending: Emergency Medicine | Admitting: Emergency Medicine

## 2014-02-22 ENCOUNTER — Encounter (HOSPITAL_COMMUNITY): Payer: Self-pay | Admitting: Emergency Medicine

## 2014-02-22 DIAGNOSIS — I129 Hypertensive chronic kidney disease with stage 1 through stage 4 chronic kidney disease, or unspecified chronic kidney disease: Secondary | ICD-10-CM | POA: Insufficient documentation

## 2014-02-22 DIAGNOSIS — M545 Low back pain, unspecified: Secondary | ICD-10-CM | POA: Insufficient documentation

## 2014-02-22 DIAGNOSIS — Z7982 Long term (current) use of aspirin: Secondary | ICD-10-CM | POA: Insufficient documentation

## 2014-02-22 DIAGNOSIS — Z79899 Other long term (current) drug therapy: Secondary | ICD-10-CM | POA: Insufficient documentation

## 2014-02-22 DIAGNOSIS — E119 Type 2 diabetes mellitus without complications: Secondary | ICD-10-CM | POA: Insufficient documentation

## 2014-02-22 DIAGNOSIS — Z860100 Personal history of colon polyps, unspecified: Secondary | ICD-10-CM | POA: Insufficient documentation

## 2014-02-22 DIAGNOSIS — Z791 Long term (current) use of non-steroidal anti-inflammatories (NSAID): Secondary | ICD-10-CM | POA: Insufficient documentation

## 2014-02-22 DIAGNOSIS — E785 Hyperlipidemia, unspecified: Secondary | ICD-10-CM | POA: Insufficient documentation

## 2014-02-22 DIAGNOSIS — Z87442 Personal history of urinary calculi: Secondary | ICD-10-CM | POA: Insufficient documentation

## 2014-02-22 DIAGNOSIS — N182 Chronic kidney disease, stage 2 (mild): Secondary | ICD-10-CM | POA: Insufficient documentation

## 2014-02-22 DIAGNOSIS — I2510 Atherosclerotic heart disease of native coronary artery without angina pectoris: Secondary | ICD-10-CM | POA: Insufficient documentation

## 2014-02-22 DIAGNOSIS — R350 Frequency of micturition: Secondary | ICD-10-CM | POA: Insufficient documentation

## 2014-02-22 DIAGNOSIS — Z8719 Personal history of other diseases of the digestive system: Secondary | ICD-10-CM | POA: Insufficient documentation

## 2014-02-22 DIAGNOSIS — Z87891 Personal history of nicotine dependence: Secondary | ICD-10-CM | POA: Insufficient documentation

## 2014-02-22 DIAGNOSIS — R109 Unspecified abdominal pain: Secondary | ICD-10-CM | POA: Insufficient documentation

## 2014-02-22 DIAGNOSIS — Z9889 Other specified postprocedural states: Secondary | ICD-10-CM | POA: Insufficient documentation

## 2014-02-22 DIAGNOSIS — M549 Dorsalgia, unspecified: Secondary | ICD-10-CM

## 2014-02-22 DIAGNOSIS — R000 Tachycardia, unspecified: Secondary | ICD-10-CM | POA: Insufficient documentation

## 2014-02-22 LAB — COMPREHENSIVE METABOLIC PANEL
ALBUMIN: 3.5 g/dL (ref 3.5–5.2)
ALT: 7 U/L (ref 0–35)
AST: 17 U/L (ref 0–37)
Alkaline Phosphatase: 741 U/L — ABNORMAL HIGH (ref 39–117)
BUN: 23 mg/dL (ref 6–23)
CALCIUM: 9 mg/dL (ref 8.4–10.5)
CO2: 21 mEq/L (ref 19–32)
Chloride: 96 mEq/L (ref 96–112)
Creatinine, Ser: 1.59 mg/dL — ABNORMAL HIGH (ref 0.50–1.10)
GFR calc Af Amer: 36 mL/min — ABNORMAL LOW (ref >90)
GFR calc non Af Amer: 31 mL/min — ABNORMAL LOW (ref >90)
Glucose, Bld: 205 mg/dL — ABNORMAL HIGH (ref 70–99)
Potassium: 5.3 mEq/L (ref 3.7–5.3)
Sodium: 136 mEq/L — ABNORMAL LOW (ref 137–147)
TOTAL PROTEIN: 7.4 g/dL (ref 6.0–8.3)
Total Bilirubin: 0.5 mg/dL (ref 0.3–1.2)

## 2014-02-22 LAB — CBC WITH DIFFERENTIAL/PLATELET
BASOS ABS: 0 10*3/uL (ref 0.0–0.1)
BASOS PCT: 1 % (ref 0–1)
EOS PCT: 1 % (ref 0–5)
Eosinophils Absolute: 0 10*3/uL (ref 0.0–0.7)
HCT: 35.1 % — ABNORMAL LOW (ref 36.0–46.0)
Hemoglobin: 11.3 g/dL — ABNORMAL LOW (ref 12.0–15.0)
Lymphocytes Relative: 35 % (ref 12–46)
Lymphs Abs: 1.2 10*3/uL (ref 0.7–4.0)
MCH: 25.3 pg — ABNORMAL LOW (ref 26.0–34.0)
MCHC: 32.2 g/dL (ref 30.0–36.0)
MCV: 78.5 fL (ref 78.0–100.0)
Monocytes Absolute: 0.2 10*3/uL (ref 0.1–1.0)
Monocytes Relative: 5 % (ref 3–12)
Neutro Abs: 2 10*3/uL (ref 1.7–7.7)
Neutrophils Relative %: 59 % (ref 43–77)
PLATELETS: 91 10*3/uL — AB (ref 150–400)
RBC: 4.47 MIL/uL (ref 3.87–5.11)
RDW: 16.1 % — AB (ref 11.5–15.5)
WBC: 3.3 10*3/uL — ABNORMAL LOW (ref 4.0–10.5)

## 2014-02-22 LAB — URINE MICROSCOPIC-ADD ON

## 2014-02-22 LAB — URINALYSIS, ROUTINE W REFLEX MICROSCOPIC
Bilirubin Urine: NEGATIVE
Hgb urine dipstick: NEGATIVE
Ketones, ur: 15 mg/dL — AB
LEUKOCYTES UA: NEGATIVE
Nitrite: NEGATIVE
PROTEIN: 30 mg/dL — AB
SPECIFIC GRAVITY, URINE: 1.022 (ref 1.005–1.030)
UROBILINOGEN UA: 0.2 mg/dL (ref 0.0–1.0)
pH: 6 (ref 5.0–8.0)

## 2014-02-22 LAB — LIPASE, BLOOD: Lipase: 31 U/L (ref 11–59)

## 2014-02-22 MED ORDER — ONDANSETRON HCL 4 MG/2ML IJ SOLN
4.0000 mg | Freq: Once | INTRAMUSCULAR | Status: AC
  Administered 2014-02-22: 4 mg via INTRAVENOUS
  Filled 2014-02-22: qty 2

## 2014-02-22 MED ORDER — OXYCODONE-ACETAMINOPHEN 7.5-325 MG PO TABS: 1.0000 | ORAL_TABLET | ORAL | Status: DC | PRN

## 2014-02-22 MED ORDER — OXYCODONE-ACETAMINOPHEN 5-325 MG PO TABS
2.0000 | ORAL_TABLET | Freq: Once | ORAL | Status: AC
  Administered 2014-02-22: 2 via ORAL
  Filled 2014-02-22: qty 2

## 2014-02-22 MED ORDER — HYDROMORPHONE HCL PF 1 MG/ML IJ SOLN
0.5000 mg | Freq: Once | INTRAMUSCULAR | Status: AC
  Administered 2014-02-22: 0.5 mg via INTRAVENOUS
  Filled 2014-02-22: qty 1

## 2014-02-22 NOTE — ED Notes (Signed)
Ambulated to the BR without difficulty.  Urine obtained and sent to the lab

## 2014-02-22 NOTE — ED Notes (Signed)
Pt. reports low back pain with urinary frequency and low abdominal cramping onset this evening , denies fever or chills.

## 2014-02-22 NOTE — ED Notes (Signed)
Patient again requesting something for pain.  MD aware.

## 2014-02-22 NOTE — ED Notes (Signed)
Patient presents with c/o lower back pain.  Has been seen here for the same.  Denies any leg weakness or numbness.  Also c/o lower abd pain that started earlier this evening.

## 2014-02-22 NOTE — ED Provider Notes (Signed)
CSN: [id number]     Arrival date & time 02/22/14  0229 History   First MD Initiated Contact with Patient 02/22/14 [phone]     Chief Complaint  Patient presents with  . Back Pain     (Consider location/radiation/quality/duration/timing/severity/associated sxs/prior Treatment) HPI Comments: 73 year old female, history of recent diagnosis of a pancreatic cystic mass, recently admitted to the hospital within the last month because of chest pain and found to have a normal CT angiogram of the chest without pulmonary embolism and a nonischemic stress test. She presents to the hospital today because of several days of lower back pain, lower abdominal cramping and urinary frequency. Her symptoms have been variable over the last several days including back pain, bilateral shoulder pain and abdominal pain which is not always in the same location. At this time she describes her lower back pain as paraspinal, spinal intra-abdominal pain as bilateral lower abdominal in location. She admits to nausea but has not vomited, she endorses eating a barbecue sandwich for dinner. She has had loose stools today. The patient has been taking hydrocodone with minimal improvement  Patient is a 73 y.o. female presenting with back pain. The history is provided by the patient.  Back Pain   Past Medical History  Diagnosis Date  . DIABETES MELLITUS, TYPE II 07/13/2007  . HYPERLIPIDEMIA 12/24/2007  . HYPERTENSION 07/13/2007  . CORONARY ARTERY DISEASE 12/24/2007  . NEPHROLITHIASIS, HX OF 12/24/2007  . Colon polyps 04/29/2010    TUBULAR ADENOMA AND A SERRATED ADENOMA  . Acute pancreatitis   . Unspecified disease of pancreas   . Chronic kidney disease, stage II (mild)    Past Surgical History  Procedure Laterality Date  . Excisional hemorrhoidectomy  unknown  . Abdominal hysterectomy  unknown  . Nasal sinus surgery  unknown  . Cardiac catheterization  2002  . Cataract extraction Bilateral    Family History  Problem Relation  Age of Onset  . Cancer Father     throat ca  . COPD Father   . Cancer Sister     uterine ca and breast ca  . Cancer Brother     throat   History  Substance Use Topics  . Smoking status: Former Smoker    Types: Cigarettes    Quit date: 12/08/2000  . Smokeless tobacco: Never Used  . Alcohol Use: No   OB History   Grav Para Term Preterm Abortions TAB SAB Ect Mult Living                 Review of Systems  Musculoskeletal: Positive for back pain.  All other systems reviewed and are negative.      Allergies  Review of patient's allergies indicates no known allergies.  Home Medications   Current Outpatient Rx  Name  Route  Sig  Dispense  Refill  . amLODipine (NORVASC) 5 MG tablet   Oral   Take 5 mg by mouth daily.         Marland Kitchen aspirin 325 MG tablet   Oral   Take 325 mg by mouth daily.          Marland Kitchen glimepiride (AMARYL) 4 MG tablet   Oral   Take 4 mg by mouth daily before breakfast.         . HYDROcodone-acetaminophen (NORCO/VICODIN) 5-325 MG per tablet   Oral   Take 1 tablet by mouth every 6 (six) hours as needed for moderate pain or severe pain.   30 tablet   0   .  LORazepam (ATIVAN) 0.5 MG tablet   Oral   Take 1 tablet (0.5 mg total) by mouth every 6 (six) hours as needed for anxiety.   90 tablet   1   . meloxicam (MOBIC) 7.5 MG tablet   Oral   Take 1 tablet (7.5 mg total) by mouth daily.   90 tablet   1   . metFORMIN (GLUCOPHAGE) 1000 MG tablet   Oral   Take 1 tablet (1,000 mg total) by mouth 2 (two) times daily with a meal.   180 tablet   0   . metoprolol tartrate (LOPRESSOR) 25 MG tablet      Take metoprolol 12.5 mg (split 25mg  tab to half) oral BID (twice a day).   60 tablet   3   . nitroGLYCERIN (NITROSTAT) 0.4 MG SL tablet   Sublingual   Place 1 tablet (0.4 mg total) under the tongue every 5 (five) minutes as needed for chest pain.   60 tablet   5   . pantoprazole (PROTONIX) 40 MG tablet   Oral   Take 1 tablet (40 mg total) by  mouth daily.   30 tablet   4   . promethazine (PHENERGAN) 12.5 MG tablet   Oral   Take 2 tablets (25 mg total) by mouth every 6 (six) hours as needed for nausea.   12 tablet   0   . simvastatin (ZOCOR) 20 MG tablet   Oral   Take 20 mg by mouth every evening.         Marland Kitchen oxyCODONE-acetaminophen (PERCOCET) 7.5-325 MG per tablet   Oral   Take 1 tablet by mouth every 4 (four) hours as needed for pain.   20 tablet   0    BP 155/54  Pulse 100  Temp(Src) 97.5 F (36.4 C) (Oral)  Resp 24  Ht 5\' 2"  (1.575 m)  Wt 175 lb (79.379 kg)  BMI 32.00 kg/m2  SpO2 99% Physical Exam  Nursing note and vitals reviewed. Constitutional: She appears well-developed and well-nourished.  HENT:  Head: Normocephalic and atraumatic.  Mouth/Throat: Oropharynx is clear and moist. No oropharyngeal exudate.  Eyes: Conjunctivae and EOM are normal. Pupils are equal, round, and reactive to light. Right eye exhibits no discharge. Left eye exhibits no discharge. No scleral icterus.  Neck: Normal range of motion. Neck supple. No JVD present. No thyromegaly present.  Cardiovascular: Regular rhythm, normal heart sounds and intact distal pulses.  Exam reveals no gallop and no friction rub.   No murmur heard. Borderline tachycardia  Pulmonary/Chest: Effort normal and breath sounds normal. No respiratory distress. She has no wheezes. She has no rales.  Abdominal: Soft. Bowel sounds are normal. She exhibits no distension and no mass. There is tenderness (scant tenderness to the bilateral lower abdomen, no focal pain over McBurney's point, left upper quadrant tenderness present).  No right upper quadrant tenderness, no Murphy's sign  Musculoskeletal: Normal range of motion. She exhibits no edema and no tenderness.  No spinal tenderness to palpation  Lymphadenopathy:    She has no cervical adenopathy.  Neurological: She is alert. Coordination normal.  Skin: Skin is warm and dry. No rash noted. No erythema.   Psychiatric: She has a normal mood and affect. Her behavior is normal.    ED Course  Procedures (including critical care time) Labs Review Labs Reviewed  URINALYSIS, ROUTINE W REFLEX MICROSCOPIC - Abnormal; Notable for the following:    Glucose, UA >1000 (*)    Ketones, ur 15 (*)  Protein, ur 30 (*)    All other components within normal limits  CBC WITH DIFFERENTIAL - Abnormal; Notable for the following:    WBC 3.3 (*)    Hemoglobin 11.3 (*)    HCT 35.1 (*)    MCH 25.3 (*)    RDW 16.1 (*)    Platelets 91 (*)    All other components within normal limits  COMPREHENSIVE METABOLIC PANEL - Abnormal; Notable for the following:    Sodium 136 (*)    Glucose, Bld 205 (*)    Creatinine, Ser 1.59 (*)    Alkaline Phosphatase 741 (*)    GFR calc non Af Amer 31 (*)    GFR calc Af Amer 36 (*)    All other components within normal limits  URINE MICROSCOPIC-ADD ON - Abnormal; Notable for the following:    Casts HYALINE CASTS (*)    All other components within normal limits  LIPASE, BLOOD   Imaging Review No results found.   EKG Interpretation None      MDM   Final diagnoses:  Back pain    The patient appears to be uncomfortable with regards to her abdominal and low back discomfort. Her vital signs show no fever, no hypotension and no hypoxia. She has had a recent thorough cardiac evaluation and has had a gastroenterology evaluation in the hospital and has been referred to the outpatient setting. The family is displeased with the time to followup with gastroenterology, I have reviewed the MRI results as well as the CT results and find there to be no recent history of significant surgical pathology or need for emergent emergency department evaluation. Given her benign abdominal exam at this time I doubt need for that at this time as well. Labs, urinalysis, pain medication, reevaluate   Pain medication help the patient feel better, she has ambulated without difficulty or trouble with  her gait. I have discussed with the family and the patient regarding discharge and followup, they are amenable to the plan. Percocet to be given, encouraged her to use stronger medication until followup at her appointment tomorrow.   Meds given in ED:  Medications  oxyCODONE-acetaminophen (PERCOCET/ROXICET) 5-325 MG per tablet 2 tablet (not administered)  HYDROmorphone (DILAUDID) injection 0.5 mg (0.5 mg Intravenous Given 02/22/14 0457)  ondansetron (ZOFRAN) injection 4 mg (4 mg Intravenous Given 02/22/14 0455)    New Prescriptions   OXYCODONE-ACETAMINOPHEN (PERCOCET) 7.5-325 MG PER TABLET    Take 1 tablet by mouth every 4 (four) hours as needed for pain.      Johnna Acosta, MD 02/22/14 [phone]

## 2014-02-22 NOTE — Discharge Instructions (Signed)
Please call your doctor for a followup appointment within 24-48 hours. When you talk to your doctor please let them know that you were seen in the emergency department and have them acquire all of your records so that they can discuss the findings with you and formulate a treatment plan to fully care for your new and ongoing problems.  Back Pain:   Your back pain should be treated with medicines such as ibuprofen or aleve and this back pain should get better over the next 2 weeks.  However if you develop severe or worsening pain, low back pain with fever, numbness, weakness or inability to walk or urinate, you should return to the ER immediately.  Please follow up with your doctor this week for a recheck if still having symptoms. Low back pain is discomfort in the lower back that may be due to injuries to muscles and ligaments around the spine.  Occasionally, it may be caused by a a problem to a part of the spine called a disc.  The pain may last several days or a week;  However, most patients get completely well in 4 weeks.  Self - care:  The application of heat can help soothe the pain.  Maintaining your daily activities, including walking, is encourged, as it will help you get better faster than just staying in bed.  Medications are also useful to help with pain control.  A commonly prescribed medications includes acetaminophen.  This medication is generally safe, though you should not take more than 8 of the extra strength (500mg ) pills a day.  Non steroidal anti inflammatory medications including Ibuprofen and naproxen;  These medications help both pain and swelling and are very useful in treating back pain.  They should be taken with food, as they can cause stomach upset, and more seriously, stomach bleeding.    Muscle relaxants:  These medications can help with muscle tightness that is a cause of lower back pain.  Most of these medications can cause drowsiness, and it is not safe to drive or use  dangerous machinery while taking them.  You will need to follow up with  Your primary healthcare provider in 1-2 weeks for reassessment.  Be aware that if you develop new symptoms, such as a fever, leg weakness, difficulty with or loss of control of your urine or bowels, abdominal pain, or more severe pain, you will need to seek medical attention and  / or return to the Emergency department.  If you do not have a doctor see the list below.

## 2014-02-23 ENCOUNTER — Ambulatory Visit (INDEPENDENT_AMBULATORY_CARE_PROVIDER_SITE_OTHER): Payer: Medicare HMO | Admitting: Internal Medicine

## 2014-02-23 ENCOUNTER — Emergency Department (HOSPITAL_COMMUNITY): Payer: Medicare HMO

## 2014-02-23 ENCOUNTER — Encounter: Payer: Self-pay | Admitting: Internal Medicine

## 2014-02-23 ENCOUNTER — Inpatient Hospital Stay (HOSPITAL_COMMUNITY)
Admission: EM | Admit: 2014-02-23 | Discharge: 2014-03-02 | DRG: 834 | Disposition: A | Discharge status: Other Acute Inpatient Hospital | Payer: Medicare HMO | Attending: Internal Medicine | Admitting: Internal Medicine

## 2014-02-23 ENCOUNTER — Encounter (HOSPITAL_COMMUNITY): Payer: Self-pay | Admitting: Emergency Medicine

## 2014-02-23 VITALS — BP 126/70 | HR 158 | Temp 102.7°F | Resp 20 | Ht 62.0 in | Wt 177.0 lb

## 2014-02-23 DIAGNOSIS — R933 Abnormal findings on diagnostic imaging of other parts of digestive tract: Secondary | ICD-10-CM

## 2014-02-23 DIAGNOSIS — D649 Anemia, unspecified: Secondary | ICD-10-CM | POA: Diagnosis present

## 2014-02-23 DIAGNOSIS — A419 Sepsis, unspecified organism: Secondary | ICD-10-CM

## 2014-02-23 DIAGNOSIS — R739 Hyperglycemia, unspecified: Secondary | ICD-10-CM

## 2014-02-23 DIAGNOSIS — Z794 Long term (current) use of insulin: Secondary | ICD-10-CM

## 2014-02-23 DIAGNOSIS — R7401 Elevation of levels of liver transaminase levels: Secondary | ICD-10-CM | POA: Diagnosis present

## 2014-02-23 DIAGNOSIS — I2510 Atherosclerotic heart disease of native coronary artery without angina pectoris: Secondary | ICD-10-CM

## 2014-02-23 DIAGNOSIS — R109 Unspecified abdominal pain: Secondary | ICD-10-CM | POA: Diagnosis present

## 2014-02-23 DIAGNOSIS — M899 Disorder of bone, unspecified: Secondary | ICD-10-CM | POA: Diagnosis present

## 2014-02-23 DIAGNOSIS — E785 Hyperlipidemia, unspecified: Secondary | ICD-10-CM

## 2014-02-23 DIAGNOSIS — R748 Abnormal levels of other serum enzymes: Secondary | ICD-10-CM

## 2014-02-23 DIAGNOSIS — R161 Splenomegaly, not elsewhere classified: Secondary | ICD-10-CM | POA: Diagnosis present

## 2014-02-23 DIAGNOSIS — E236 Other disorders of pituitary gland: Secondary | ICD-10-CM | POA: Diagnosis present

## 2014-02-23 DIAGNOSIS — R5381 Other malaise: Secondary | ICD-10-CM | POA: Diagnosis present

## 2014-02-23 DIAGNOSIS — D696 Thrombocytopenia, unspecified: Secondary | ICD-10-CM | POA: Diagnosis present

## 2014-02-23 DIAGNOSIS — E119 Type 2 diabetes mellitus without complications: Secondary | ICD-10-CM

## 2014-02-23 DIAGNOSIS — R7402 Elevation of levels of lactic acid dehydrogenase (LDH): Secondary | ICD-10-CM | POA: Diagnosis present

## 2014-02-23 DIAGNOSIS — E872 Acidosis, unspecified: Secondary | ICD-10-CM | POA: Diagnosis present

## 2014-02-23 DIAGNOSIS — I4892 Unspecified atrial flutter: Secondary | ICD-10-CM

## 2014-02-23 DIAGNOSIS — M949 Disorder of cartilage, unspecified: Secondary | ICD-10-CM

## 2014-02-23 DIAGNOSIS — R82998 Other abnormal findings in urine: Secondary | ICD-10-CM

## 2014-02-23 DIAGNOSIS — N183 Chronic kidney disease, stage 3 unspecified: Secondary | ICD-10-CM

## 2014-02-23 DIAGNOSIS — Z860100 Personal history of colon polyps, unspecified: Secondary | ICD-10-CM

## 2014-02-23 DIAGNOSIS — K863 Pseudocyst of pancreas: Secondary | ICD-10-CM

## 2014-02-23 DIAGNOSIS — R9389 Abnormal findings on diagnostic imaging of other specified body structures: Secondary | ICD-10-CM | POA: Diagnosis present

## 2014-02-23 DIAGNOSIS — R0789 Other chest pain: Secondary | ICD-10-CM

## 2014-02-23 DIAGNOSIS — D638 Anemia in other chronic diseases classified elsewhere: Secondary | ICD-10-CM | POA: Diagnosis present

## 2014-02-23 DIAGNOSIS — R52 Pain, unspecified: Secondary | ICD-10-CM | POA: Diagnosis present

## 2014-02-23 DIAGNOSIS — R740 Nonspecific elevation of levels of transaminase and lactic acid dehydrogenase [LDH]: Secondary | ICD-10-CM

## 2014-02-23 DIAGNOSIS — G893 Neoplasm related pain (acute) (chronic): Secondary | ICD-10-CM

## 2014-02-23 DIAGNOSIS — M549 Dorsalgia, unspecified: Secondary | ICD-10-CM

## 2014-02-23 DIAGNOSIS — N289 Disorder of kidney and ureter, unspecified: Secondary | ICD-10-CM

## 2014-02-23 DIAGNOSIS — IMO0001 Reserved for inherently not codable concepts without codable children: Secondary | ICD-10-CM | POA: Diagnosis present

## 2014-02-23 DIAGNOSIS — R7989 Other specified abnormal findings of blood chemistry: Secondary | ICD-10-CM | POA: Diagnosis present

## 2014-02-23 DIAGNOSIS — I4891 Unspecified atrial fibrillation: Secondary | ICD-10-CM

## 2014-02-23 DIAGNOSIS — K7689 Other specified diseases of liver: Secondary | ICD-10-CM | POA: Diagnosis present

## 2014-02-23 DIAGNOSIS — Z87442 Personal history of urinary calculi: Secondary | ICD-10-CM

## 2014-02-23 DIAGNOSIS — Z79899 Other long term (current) drug therapy: Secondary | ICD-10-CM

## 2014-02-23 DIAGNOSIS — R627 Adult failure to thrive: Secondary | ICD-10-CM | POA: Diagnosis present

## 2014-02-23 DIAGNOSIS — Z6833 Body mass index (BMI) 33.0-33.9, adult: Secondary | ICD-10-CM

## 2014-02-23 DIAGNOSIS — Z7982 Long term (current) use of aspirin: Secondary | ICD-10-CM

## 2014-02-23 DIAGNOSIS — K859 Acute pancreatitis without necrosis or infection, unspecified: Secondary | ICD-10-CM

## 2014-02-23 DIAGNOSIS — D72819 Decreased white blood cell count, unspecified: Secondary | ICD-10-CM

## 2014-02-23 DIAGNOSIS — D61818 Other pancytopenia: Secondary | ICD-10-CM | POA: Diagnosis present

## 2014-02-23 DIAGNOSIS — R5383 Other fatigue: Secondary | ICD-10-CM

## 2014-02-23 DIAGNOSIS — M25519 Pain in unspecified shoulder: Secondary | ICD-10-CM | POA: Diagnosis present

## 2014-02-23 DIAGNOSIS — I129 Hypertensive chronic kidney disease with stage 1 through stage 4 chronic kidney disease, or unspecified chronic kidney disease: Secondary | ICD-10-CM | POA: Diagnosis present

## 2014-02-23 DIAGNOSIS — E43 Unspecified severe protein-calorie malnutrition: Secondary | ICD-10-CM | POA: Diagnosis present

## 2014-02-23 DIAGNOSIS — R1013 Epigastric pain: Secondary | ICD-10-CM

## 2014-02-23 DIAGNOSIS — Z9849 Cataract extraction status, unspecified eye: Secondary | ICD-10-CM

## 2014-02-23 DIAGNOSIS — Z87891 Personal history of nicotine dependence: Secondary | ICD-10-CM

## 2014-02-23 DIAGNOSIS — R079 Chest pain, unspecified: Secondary | ICD-10-CM

## 2014-02-23 DIAGNOSIS — K7460 Unspecified cirrhosis of liver: Secondary | ICD-10-CM

## 2014-02-23 DIAGNOSIS — I10 Essential (primary) hypertension: Secondary | ICD-10-CM

## 2014-02-23 DIAGNOSIS — K869 Disease of pancreas, unspecified: Secondary | ICD-10-CM | POA: Diagnosis present

## 2014-02-23 DIAGNOSIS — C9500 Acute leukemia of unspecified cell type not having achieved remission: Principal | ICD-10-CM

## 2014-02-23 DIAGNOSIS — M542 Cervicalgia: Secondary | ICD-10-CM

## 2014-02-23 DIAGNOSIS — K862 Cyst of pancreas: Secondary | ICD-10-CM | POA: Diagnosis present

## 2014-02-23 DIAGNOSIS — M898X9 Other specified disorders of bone, unspecified site: Secondary | ICD-10-CM

## 2014-02-23 LAB — URINALYSIS, ROUTINE W REFLEX MICROSCOPIC
Bilirubin Urine: NEGATIVE
Glucose, UA: >1000 mg/dL — AB
Hgb urine dipstick: NEGATIVE
Ketones, ur: 15 mg/dL — AB
Leukocytes, UA: NEGATIVE
Nitrite: NEGATIVE
Protein, ur: NEGATIVE mg/dL
Specific Gravity, Urine: 1.027 (ref 1.005–1.030)
Urobilinogen, UA: 0.2 mg/dL (ref 0.0–1.0)
pH: 5 (ref 5.0–8.0)

## 2014-02-23 LAB — BASIC METABOLIC PANEL
BUN: 25 mg/dL — ABNORMAL HIGH (ref 6–23)
CO2: 19 mEq/L (ref 19–32)
Calcium: 8.8 mg/dL (ref 8.4–10.5)
Chloride: 94 mEq/L — ABNORMAL LOW (ref 96–112)
Creatinine, Ser: 1.79 mg/dL — ABNORMAL HIGH (ref 0.50–1.10)
Glucose, Bld: 247 mg/dL — ABNORMAL HIGH (ref 70–99)

## 2014-02-23 LAB — I-STAT CG4 LACTIC ACID, ED
Lactic Acid, Venous: 3.07 mmol/L — ABNORMAL HIGH (ref 0.5–2.2)
Lactic Acid, Venous: 1.6 mmol/L (ref 0.5–2.2)

## 2014-02-23 LAB — BASIC METABOLIC PANEL WITH GFR
GFR calc Af Amer: 31 mL/min — ABNORMAL LOW (ref >90)
GFR calc non Af Amer: 27 mL/min — ABNORMAL LOW (ref >90)
Potassium: 4.7 meq/L (ref 3.7–5.3)
Sodium: 133 meq/L — ABNORMAL LOW (ref 137–147)

## 2014-02-23 LAB — URINE MICROSCOPIC-ADD ON

## 2014-02-23 LAB — CBC
HCT: 33.7 % — ABNORMAL LOW (ref 36.0–46.0)
Hemoglobin: 11 g/dL — ABNORMAL LOW (ref 12.0–15.0)
MCH: 25.4 pg — ABNORMAL LOW (ref 26.0–34.0)
MCHC: 32.6 g/dL (ref 30.0–36.0)
MCV: 77.8 fL — ABNORMAL LOW (ref 78.0–100.0)
Platelets: 85 10*3/uL — ABNORMAL LOW (ref 150–400)
RBC: 4.33 MIL/uL (ref 3.87–5.11)
RDW: 16.3 % — ABNORMAL HIGH (ref 11.5–15.5)
WBC: 2.6 10*3/uL — ABNORMAL LOW (ref 4.0–10.5)

## 2014-02-23 LAB — I-STAT TROPONIN, ED: Troponin i, poc: 0.01 ng/mL (ref 0.00–0.08)

## 2014-02-23 MED ORDER — SODIUM CHLORIDE 0.9 % IV SOLN
1000.0000 mL | Freq: Once | INTRAVENOUS | Status: AC
  Administered 2014-02-23: 1000 mL via INTRAVENOUS

## 2014-02-23 MED ORDER — FENTANYL CITRATE 0.05 MG/ML IJ SOLN
50.0000 ug | Freq: Once | INTRAMUSCULAR | Status: AC
  Administered 2014-02-23: 50 ug via INTRAVENOUS
  Filled 2014-02-23: qty 2

## 2014-02-23 MED ORDER — PIPERACILLIN-TAZOBACTAM 3.375 G IVPB
3.3750 g | Freq: Three times a day (TID) | INTRAVENOUS | Status: DC
  Filled 2014-02-23 (×2): qty 50

## 2014-02-23 MED ORDER — HYDROMORPHONE HCL PF 1 MG/ML IJ SOLN
0.5000 mg | INTRAMUSCULAR | Status: DC | PRN
  Administered 2014-02-23: 0.5 mg via INTRAVENOUS
  Filled 2014-02-23: qty 1

## 2014-02-23 MED ORDER — SODIUM CHLORIDE 0.9 % IV SOLN
1000.0000 mL | INTRAVENOUS | Status: DC
  Administered 2014-02-23: 1000 mL via INTRAVENOUS

## 2014-02-23 MED ORDER — PIPERACILLIN-TAZOBACTAM 3.375 G IVPB 30 MIN
3.3750 g | Freq: Once | INTRAVENOUS | Status: AC
  Administered 2014-02-23: 3.375 g via INTRAVENOUS
  Filled 2014-02-23: qty 50

## 2014-02-23 MED ORDER — SODIUM CHLORIDE 0.9 % IV BOLUS (SEPSIS)
500.0000 mL | Freq: Once | INTRAVENOUS | Status: AC
  Administered 2014-02-23: 500 mL via INTRAVENOUS

## 2014-02-23 MED ORDER — HYDROMORPHONE HCL PF 1 MG/ML IJ SOLN
0.5000 mg | Freq: Once | INTRAMUSCULAR | Status: AC
  Administered 2014-02-23: 0.5 mg via INTRAVENOUS
  Filled 2014-02-23: qty 1

## 2014-02-23 MED ORDER — IOHEXOL 300 MG/ML  SOLN
25.0000 mL | INTRAMUSCULAR | Status: AC
  Administered 2014-02-23 (×2): 25 mL via ORAL

## 2014-02-23 MED ORDER — ACETAMINOPHEN 500 MG PO TABS
1000.0000 mg | ORAL_TABLET | Freq: Once | ORAL | Status: AC
  Administered 2014-02-23: 1000 mg via ORAL
  Filled 2014-02-23: qty 2

## 2014-02-23 MED ORDER — HYDROMORPHONE HCL PF 1 MG/ML IJ SOLN: 1.0000 mg | Freq: Once | INTRAMUSCULAR | Status: DC

## 2014-02-23 NOTE — ED Notes (Addendum)
Pt sent here by her pcp for an irregular heartbeat.  She was seeing him for a follow-up visit post er chest pain tx.  C/o nausea and some sob. C/o back pain and chest pain immediately below both breasts.

## 2014-02-23 NOTE — ED Notes (Signed)
Pt refused to take off one of her rings for MRI, radiology ok.

## 2014-02-23 NOTE — Progress Notes (Signed)
Subjective:    Patient ID: [name], female    DOB: 12/06/1941, 73 y.o.   MRN: [id number]  HPI  73 year old patient who has had 2 recent hospitalizations.  She was discharged on February 24 and treated for a suspected pancreatitis.  She is noted to have a cystic mass involving the pancreatic head.  At that time.  She is written in the hospital with atypical chest pain and brief atrial flutter with a discharge date of March 2.  She's had a extensive cardiac evaluation including a negative stress test.  The patient was seen in the ED again yesterday complaining of low back pain over the past few days.  She was seen by GI and followup 10 days ago.  At that time.  Her GI complaints had resolved and her only complaint was some low back pain.  She presents to the office today complaining of worsening low back pain.  Was also noted to have fever and tachycardia.  Past Medical History  Diagnosis Date  . DIABETES MELLITUS, TYPE II 07/13/2007  . HYPERLIPIDEMIA 12/24/2007  . HYPERTENSION 07/13/2007  . CORONARY ARTERY DISEASE 12/24/2007  . NEPHROLITHIASIS, HX OF 12/24/2007  . Colon polyps 04/29/2010    TUBULAR ADENOMA AND A SERRATED ADENOMA  . Acute pancreatitis   . Unspecified disease of pancreas   . Chronic kidney disease, stage II (mild)     History   Social History  . Marital Status: Married    Spouse Name: N/A    Number of Children: N/A  . Years of Education: N/A   Occupational History  . Not on file.   Social History Main Topics  . Smoking status: Former Smoker    Types: Cigarettes    Quit date: 12/08/2000  . Smokeless tobacco: Never Used  . Alcohol Use: No  . Drug Use: No  . Sexual Activity: Not on file   Other Topics Concern  . Not on file   Social History Narrative  . No narrative on file    Past Surgical History  Procedure Laterality Date  . Excisional hemorrhoidectomy  unknown  . Abdominal hysterectomy  unknown  . Nasal sinus surgery  unknown  . Cardiac  catheterization  2002  . Cataract extraction Bilateral     Family History  Problem Relation Age of Onset  . Cancer Father     throat ca  . COPD Father   . Cancer Sister     uterine ca and breast ca  . Cancer Brother     throat    No Known Allergies  Current Outpatient Prescriptions on File Prior to Visit  Medication Sig Dispense Refill  . amLODipine (NORVASC) 5 MG tablet Take 5 mg by mouth daily.      Marland Kitchen aspirin 325 MG tablet Take 325 mg by mouth daily.       Marland Kitchen glimepiride (AMARYL) 4 MG tablet Take 4 mg by mouth daily before breakfast.      . LORazepam (ATIVAN) 0.5 MG tablet Take 1 tablet (0.5 mg total) by mouth every 6 (six) hours as needed for anxiety.  90 tablet  1  . meloxicam (MOBIC) 7.5 MG tablet Take 1 tablet (7.5 mg total) by mouth daily.  90 tablet  1  . metFORMIN (GLUCOPHAGE) 1000 MG tablet Take 1 tablet (1,000 mg total) by mouth 2 (two) times daily with a meal.  180 tablet  0  . metoprolol tartrate (LOPRESSOR) 25 MG tablet Take metoprolol 12.5 mg (split 25mg  tab  to half) oral BID (twice a day).  60 tablet  3  . nitroGLYCERIN (NITROSTAT) 0.4 MG SL tablet Place 1 tablet (0.4 mg total) under the tongue every 5 (five) minutes as needed for chest pain.  60 tablet  5  . oxyCODONE-acetaminophen (PERCOCET) 7.5-325 MG per tablet Take 1 tablet by mouth every 4 (four) hours as needed for pain.  20 tablet  0  . pantoprazole (PROTONIX) 40 MG tablet Take 1 tablet (40 mg total) by mouth daily.  30 tablet  4  . promethazine (PHENERGAN) 12.5 MG tablet Take 2 tablets (25 mg total) by mouth every 6 (six) hours as needed for nausea.  12 tablet  0  . simvastatin (ZOCOR) 20 MG tablet Take 20 mg by mouth every evening.      Marland Kitchen HYDROcodone-acetaminophen (NORCO/VICODIN) 5-325 MG per tablet Take 1 tablet by mouth every 6 (six) hours as needed for moderate pain or severe pain.  30 tablet  0   No current facility-administered medications on file prior to visit.    BP 126/70  Pulse 158  Temp(Src)  102.7 F (39.3 C) (Oral)  Resp 20  Ht 5\' 2"  (1.575 m)  Wt 177 lb (80.287 kg)  BMI 32.37 kg/m2  SpO2 98%        Review of Systems  Constitutional: Positive for fever, activity change, appetite change and fatigue.  HENT: Negative for congestion, dental problem, hearing loss, rhinorrhea, sinus pressure, sore throat and tinnitus.   Eyes: Negative for pain, discharge and visual disturbance.  Respiratory: Negative for cough and shortness of breath.   Cardiovascular: Negative for chest pain, palpitations and leg swelling.  Gastrointestinal: Positive for abdominal pain. Negative for nausea, vomiting, diarrhea, constipation, blood in stool and abdominal distention.  Genitourinary: Negative for dysuria, urgency, frequency, hematuria, flank pain, vaginal bleeding, vaginal discharge, difficulty urinating, vaginal pain and pelvic pain.  Musculoskeletal: Positive for back pain. Negative for arthralgias, gait problem and joint swelling.  Skin: Negative for rash.  Neurological: Negative for dizziness, syncope, speech difficulty, weakness, numbness and headaches.  Hematological: Negative for adenopathy.  Psychiatric/Behavioral: Negative for behavioral problems, dysphoric mood and agitation. The patient is not nervous/anxious.        Objective:   Physical Exam  Constitutional: She is oriented to person, place, and time. She appears well-developed and well-nourished.  Temperature 100.7 Pulse fast and irregular  HENT:  Head: Normocephalic.  Right Ear: External ear normal.  Left Ear: External ear normal.  Mouth/Throat: Oropharynx is clear and moist.  Eyes: Conjunctivae and EOM are normal. Pupils are equal, round, and reactive to light.  Neck: Normal range of motion. Neck supple. No thyromegaly present.  Cardiovascular: Normal rate, normal heart sounds and intact distal pulses.   Irregular tachycardia  Pulmonary/Chest: Effort normal and breath sounds normal.  Abdominal: Soft. Bowel sounds are  normal. She exhibits no mass. There is tenderness.  Very mild epigastric tenderness  Musculoskeletal: Normal range of motion.  No tenderness over the thoracic or lumbar spine  Lymphadenopathy:    She has no cervical adenopathy.  Neurological: She is alert and oriented to person, place, and time.  Skin: Skin is warm and dry. No rash noted.  Psychiatric: She has a normal mood and affect. Her behavior is normal.          Assessment & Plan:  Worsening back pain and fever.  Will need lumbar CT to exclude discitis, epidural abscess, etc. Hypertension Diabetes Atrial fibrillation with uncontrolled ventricular response   The patient  will be referred back to the ED for further evaluation and management

## 2014-02-23 NOTE — Progress Notes (Signed)
ANTIBIOTIC CONSULT NOTE - INITIAL  Pharmacy Consult for Zosyn Indication: intra-abdominal infection  No Known Allergies  Patient Measurements: Height: 5' 1.81" (157 cm) Weight: 177 lb 0.5 oz (80.3 kg) IBW/kg (Calculated) : 49.67  Vital Signs: Temp: 99.3 F (37.4 C) (03/19 1942) Temp src: Oral (03/19 1942) BP: 165/69 mmHg (03/19 1945) Pulse Rate: 118 (03/19 1945)  Labs:  Recent Labs  02/22/14 0406 02/23/14 1746  WBC 3.3* 2.6*  HGB 11.3* 11.0*  PLT 91* 85*  CREATININE 1.59* 1.79*   Estimated Creatinine Clearance: 27.8 ml/min (by C-G formula based on Cr of 1.79). No results found for this basename: VANCOTROUGH, VANCOPEAK, VANCORANDOM, GENTTROUGH, GENTPEAK, GENTRANDOM, TOBRATROUGH, TOBRAPEAK, TOBRARND, AMIKACINPEAK, AMIKACINTROU, AMIKACIN,  in the last 72 hours   Microbiology: No results found for this or any previous visit (from the past 720 hour(s)).  Medical History: Past Medical History  Diagnosis Date  . DIABETES MELLITUS, TYPE II 07/13/2007  . HYPERLIPIDEMIA 12/24/2007  . HYPERTENSION 07/13/2007  . CORONARY ARTERY DISEASE 12/24/2007  . NEPHROLITHIASIS, HX OF 12/24/2007  . Colon polyps 04/29/2010    TUBULAR ADENOMA AND A SERRATED ADENOMA  . Acute pancreatitis   . Unspecified disease of pancreas   . Chronic kidney disease, stage II (mild)     Medications:  Scheduled:  .  HYDROmorphone (DILAUDID) injection  0.5 mg Intravenous Once   Infusions:  . sodium chloride 1,000 mL (02/23/14 1813)  . piperacillin-tazobactam     Assessment: 73 yo F with recent diagnosis of pancreatic cystic mass and recent admission last month for CP, presents to ED tonight with c/o several days of lower back pain, lower abdominal cramping, nausea, and increased urinary frequency.  Pharmacy has been consulted to dose Zosyn for intra-abdominal infection.  She is currently receiving a one time dose of Zosyn IV 3.375g 98min infusion in the ED.  Initial labs reveal WBC 2.6 and SCr increasing from  1.59 to 1.79 with estimated CrCl ~28.  Patient has a Tmax in ED of 102.9, but currently afebrile.  Goal of Therapy:  Resolution of infection  Plan:  - continue with Zosyn IV 3.375g 55min infusion x1 in ED, followed by Zosyn IV 3.375g q8h (4h extended infusion) - monitor kidney function, WBC, temperature curve, any cultures, and clinical progression  Ovid Curd E. Jacqlyn Larsen, PharmD Clinical Pharmacist - Resident Pager: [phone] Pharmacy: [phone] 02/23/2014 8:17 PM

## 2014-02-23 NOTE — Progress Notes (Signed)
Pre-visit discussion using our clinic review tool. No additional management support is needed unless otherwise documented below in the visit note.  

## 2014-02-23 NOTE — Patient Instructions (Signed)
Return to the [facility]  emergency room immediately for further evaluation and management

## 2014-02-23 NOTE — ED Provider Notes (Addendum)
CSN: [id number]     Arrival date & time 02/23/14  1733 History   First MD Initiated Contact with Patient 02/23/14 1747     Chief Complaint  Patient presents with  . Palpitations     (Consider location/radiation/quality/duration/timing/severity/associated sxs/prior Treatment) HPI Comments: Pt with no reported h/o atrial fib presents from PCP office who was following up on patient to an ED visit yesterday.  Pt has had unusual back pain that is worsening over the past 3 days in lower back.  No change in urination, has felt cold and hot, but didn't know she had a fever, no cough, diarrhea, vomiting.  Has had some intermittent CP's, none now.  Has not had anti fever reducers.  Doesn't typically have low back pain.  Pt had extensive work up in the ED previously and has had neg Myoview and CT of chest, abd/pelvis as well a few weeks ago.    Patient is a 73 y.o. female presenting with palpitations. The history is provided by the patient and medical records. The history is limited by the condition of the patient.  Palpitations   Past Medical History  Diagnosis Date  . DIABETES MELLITUS, TYPE II 07/13/2007  . HYPERLIPIDEMIA 12/24/2007  . HYPERTENSION 07/13/2007  . CORONARY ARTERY DISEASE 12/24/2007  . NEPHROLITHIASIS, HX OF 12/24/2007  . Colon polyps 04/29/2010    TUBULAR ADENOMA AND A SERRATED ADENOMA  . Acute pancreatitis   . Unspecified disease of pancreas   . Chronic kidney disease, stage II (mild)    Past Surgical History  Procedure Laterality Date  . Excisional hemorrhoidectomy  unknown  . Abdominal hysterectomy  unknown  . Nasal sinus surgery  unknown  . Cardiac catheterization  2002  . Cataract extraction Bilateral    Family History  Problem Relation Age of Onset  . Cancer Father     throat ca  . COPD Father   . Cancer Sister     uterine ca and breast ca  . Cancer Brother     throat   History  Substance Use Topics  . Smoking status: Former Smoker    Types: Cigarettes    Quit  date: 12/08/2000  . Smokeless tobacco: Never Used  . Alcohol Use: No   OB History   Grav Para Term Preterm Abortions TAB SAB Ect Mult Living                 Review of Systems  Unable to perform ROS: Acuity of condition  Cardiovascular: Positive for palpitations.      Allergies  Review of patient's allergies indicates no known allergies.  Home Medications   Current Outpatient Rx  Name  Route  Sig  Dispense  Refill  . amLODipine (NORVASC) 5 MG tablet   Oral   Take 5 mg by mouth daily.         Marland Kitchen aspirin 325 MG tablet   Oral   Take 325 mg by mouth daily.          Marland Kitchen glimepiride (AMARYL) 4 MG tablet   Oral   Take 4 mg by mouth daily before breakfast.         . HYDROcodone-acetaminophen (NORCO/VICODIN) 5-325 MG per tablet   Oral   Take 1 tablet by mouth every 6 (six) hours as needed for moderate pain or severe pain.   30 tablet   0   . LORazepam (ATIVAN) 0.5 MG tablet   Oral   Take 1 tablet (0.5 mg total) by mouth every  6 (six) hours as needed for anxiety.   90 tablet   1   . meloxicam (MOBIC) 7.5 MG tablet   Oral   Take 1 tablet (7.5 mg total) by mouth daily.   90 tablet   1   . metFORMIN (GLUCOPHAGE) 1000 MG tablet   Oral   Take 1 tablet (1,000 mg total) by mouth 2 (two) times daily with a meal.   180 tablet   0   . oxyCODONE-acetaminophen (PERCOCET) 7.5-325 MG per tablet   Oral   Take 1 tablet by mouth every 4 (four) hours as needed for pain.   20 tablet   0   . pantoprazole (PROTONIX) 40 MG tablet   Oral   Take 1 tablet (40 mg total) by mouth daily.   30 tablet   4   . promethazine (PHENERGAN) 12.5 MG tablet   Oral   Take 2 tablets (25 mg total) by mouth every 6 (six) hours as needed for nausea.   12 tablet   0   . simvastatin (ZOCOR) 20 MG tablet   Oral   Take 20 mg by mouth every evening.         . metoprolol tartrate (LOPRESSOR) 25 MG tablet      Take metoprolol 12.5 mg (split 25mg  tab to half) oral BID (twice a day).   60  tablet   3   . nitroGLYCERIN (NITROSTAT) 0.4 MG SL tablet   Sublingual   Place 1 tablet (0.4 mg total) under the tongue every 5 (five) minutes as needed for chest pain.   60 tablet   5    BP 126/82  Pulse 115  Temp(Src) 98.3 F (36.8 C) (Oral)  Resp 19  Ht 5' 1.81" (1.57 m)  Wt 177 lb 0.5 oz (80.3 kg)  BMI 32.58 kg/m2  SpO2 96% Physical Exam  Nursing note and vitals reviewed. Constitutional: She is oriented to person, place, and time. She appears well-developed and well-nourished. She is cooperative. She has a sickly appearance. She appears ill. She appears distressed.  Eyes: Conjunctivae and EOM are normal. No scleral icterus.  Neck: Normal range of motion. Neck supple. No JVD present.  Pulmonary/Chest: Accessory muscle usage present. Tachypnea noted. She has no decreased breath sounds. She has no wheezes. She has no rhonchi.  Abdominal: Soft. She exhibits no distension. There is no tenderness. There is no rebound and no guarding.  Neurological: She is alert and oriented to person, place, and time. She exhibits normal muscle tone. Coordination normal.  Skin: Skin is warm. No rash noted. She is diaphoretic.    ED Course  Procedures (including critical care time) Labs Review Labs Reviewed  BASIC METABOLIC PANEL - Abnormal; Notable for the following:    Sodium 133 (*)    Chloride 94 (*)    Glucose, Bld 247 (*)    BUN 25 (*)    Creatinine, Ser 1.79 (*)    GFR calc non Af Amer 27 (*)    GFR calc Af Amer 31 (*)    All other components within normal limits  CBC - Abnormal; Notable for the following:    WBC 2.6 (*)    Hemoglobin 11.0 (*)    HCT 33.7 (*)    MCV 77.8 (*)    MCH 25.4 (*)    RDW 16.3 (*)    Platelets 85 (*)    All other components within normal limits  URINALYSIS, ROUTINE W REFLEX MICROSCOPIC - Abnormal; Notable for the following:  APPearance CLOUDY (*)    Glucose, UA >1000 (*)    Ketones, ur 15 (*)    All other components within normal limits  URINE  MICROSCOPIC-ADD ON - Abnormal; Notable for the following:    Squamous Epithelial / LPF FEW (*)    Casts HYALINE CASTS (*)    All other components within normal limits  I-STAT CG4 LACTIC ACID, ED - Abnormal; Notable for the following:    Lactic Acid, Venous 3.07 (*)    All other components within normal limits  CULTURE, BLOOD (ROUTINE X 2)  CULTURE, BLOOD (ROUTINE X 2)  URINE CULTURE  LIPASE, BLOOD  HEPATIC FUNCTION PANEL  I-STAT TROPOININ, ED  I-STAT CG4 LACTIC ACID, ED   Imaging Review Mr Lumbar Spine Wo Contrast  02/23/2014   CLINICAL DATA:  Back pain, concern for osteomyelitis/discitis.  EXAM: MRI LUMBAR SPINE WITHOUT CONTRAST  TECHNIQUE: Multiplanar, multisequence MR imaging was performed. No intravenous contrast was administered.  COMPARISON:  Prior MRI from 01/31/2014 and CT from 01/29/2014.  FINDINGS: For the purposes of this dictation, the lowest well-formed intervertebral disc spaces presumed to be the L5-S1 level, and there presumed to be 5 lumbar type vertebral bodies.  Mild straightening of the normal lumbar lordosis. There is no listhesis. Vertebral body heights are preserved. Degenerative endplate Schmorl's node present at the superior endplate of 624THL.  Mild diffuse congenital shortening of the pedicles noted.  Signal intensity within the vertebral body bone marrow is normal. Signal intensity within the spinal cord is within normal limits as well. Conus medullaris terminates at the L1 level. No abnormal STIR signal seen within the intervertebral discs or vertebral bodies to suggest osteomyelitis/ discitis. No epidural fluid collection. Mildly prominent epidural fat noted within the lower lumbar spine.  At T11-12, seen only on sagittal projection. There is degenerative disc bulging with bilateral facet arthrosis. The bulging disc appears to abut the ventral spinal cord with resultant moderate to severe central canal stenosis. No definite cord signal changes identified. Canal stenosis  (series 5, image 6). Mild right foraminal narrowing is present this level as well.  At T12-L1, prominent degenerative endplate spurring seen anteriorly. There is mild disc desiccation without significant disc bulge. No focal disc protrusion or facet arthrosis. No canal or neural foraminal stenosis.  At L1-2, there is degenerative disc desiccation with mild diffuse disc bulge. Mild bilateral facet hypertrophy. No canal or neural foraminal stenosis.  At L2-3, there is degenerative disc desiccation with associated degenerative endplate changes anteriorly. Mild anterior osteophytic spurring. There is mild disc bulge without focal disc protrusion. Mild bilateral facet hypertrophy and ligamentum flavum thickening. No canal or neural foraminal stenosis.  At L3-4, there is disc desiccation with mild diffuse disc bulge mild bilateral facet arthrosis. There is resultant moderate canal narrowing, largely due to short pedicles. There is no focal disc protrusion, although the bulging disc appears to abut the exiting left L3 nerve root as it courses out of the neural foramen (series 8, image 16). Neural foramina remain widely patent.  At L4-5, there is degenerative disc desiccation with mild-to-moderate bilateral facet hypertrophy and ligamentum flavum thickening. Moderate central canal stenosis present, largely due to short pedicles. There is mild left foraminal narrowing without significant right foraminal stenosis. No focal disc herniation at this level.  At L5-S1, there is degenerative disc desiccation with diffuse disc bulging and mild bilateral facet hypertrophy. A superimposed left foraminal disc protrusion is present, abutting the left L5 nerve root as it courses through the left  neural foramen (series 8, image 28). There is moderate left foraminal stenosis. No significant canal or right neural foraminal narrowing. Prominent epidural lipomatosis noted at this level.  Visualized paraspinous musculature is within normal  limits. Visualized visceral structures are unremarkable. No retroperitoneal adenopathy.  IMPRESSION: 1. No MRI evidence of osteomyelitis/discitis or other infectious process within the lower back. 2. Right central disc bulge/protrusion at T11-12 with resultant moderate to severe central canal stenosis. No definite cord signal changes identified. 3. Left foraminal disc protrusion at L5-S1 impinging upon the left L5 nerve root and resulting and moderate left neural foraminal stenosis. 4. Mild degenerative disc bulging and facet arthrosis at L3-4 and L4-5, which superimposed a short pedicles results in moderate canal stenosis. At the L3-4 level, the bulging disc appears to abut the exiting left L3 nerve root as it courses out of the left neural foramen.   Electronically Signed   By: Jeannine Boga M.D.   On: 02/23/2014 22:51   Dg Chest Port 1 View  02/23/2014   CLINICAL DATA:  Fever.  EXAM: PORTABLE CHEST - 1 VIEW  COMPARISON:  Chest x-ray 01/29/2014 .  FINDINGS: Mediastinum and hilar structures are normal. Heart size upper limits normal, no pulmonary venous congestion. Minimal left base infiltrate cannot be excluded. No pleural effusion or pneumothorax. No acute bony abnormality. Degenerative changes thoracic spine and both shoulders.  IMPRESSION: Cannot exclude a minimal infiltrate in the left lung base.   Electronically Signed   By: Marcello Moores  Register   On: 02/23/2014 18:45     EKG Interpretation   Date/Time:  Thursday February 23 2014 17:43:28 EDT Ventricular Rate:  149 PR Interval:    QRS Duration: 78 QT Interval:  282 QTC Calculation: 444 R Axis:   87 Text Interpretation:  Atrial fibrillation with rapid ventricular response  Low voltage QRS Abnormal QRS-T angle, consider primary T wave abnormality  Abnormal ECG Nonspecific ST depression diffuse leads Confirmed by [facility]   MD, MICHEAL ([id number]) on 02/23/2014 6:37:35 PM     RA sat is 98% and I interpret to be adequate  6:41 PM POC lactic acid  is elevated, will need further resuscitation and recheck of lactic acid.    8:03 PM] Pt required IV dilaudid due to abd pain with last admission while receiving IV abx.  Pt with possibly subtle LLL infiltrate, will treat with IV zosyn and vanco.  Given possibility of intra-abdominal infection due to new pancreatic cyst, would also be covered by IV zosyn as well.  With IVF's and antipyretics given, HR is improved into the 110's.    9:43 PM BP and HR improved, lactic acid improved as well.  Stabilizing, ok for telemetry in my opinion.  Abd CT changed to non contrast due to Cr as well as Lumbar MRI.    11:18 PM Pain improved.  MRI does not suggest diskitis or other acute infectious process.  CT of abd/pelvis is pending.  Awaiting hosptialist call back for admission.   MDM   Final diagnoses:  Sepsis  Atrial fibrillation with RVR  Back pain  Renal insufficiency  Hyperglycemia    Pt with fever to 102, tachycardic, irregular consistent wih atrial fib.  Pt had an ECG with atrial flutter from this past 02-14-2023.  Pt is not anticoagulated.  Pt has had neg CT of chest and abd/pelvis recnely.y  Will begin sepsis evaluation and provide IVF's, antipyretics and consider IV diltiazem as well for cardiac.  Blood cultures obtained.  UA and U cx also  ordered.  Will need admission.       Saddie Benders. Dorna Mai, MD 02/23/14 2144  Saddie Benders. Dorna Mai, MD 02/23/14 [phone]

## 2014-02-24 ENCOUNTER — Encounter (HOSPITAL_COMMUNITY): Payer: Self-pay | Admitting: Internal Medicine

## 2014-02-24 ENCOUNTER — Inpatient Hospital Stay (HOSPITAL_COMMUNITY): Payer: Medicare HMO

## 2014-02-24 DIAGNOSIS — A419 Sepsis, unspecified organism: Secondary | ICD-10-CM

## 2014-02-24 DIAGNOSIS — D696 Thrombocytopenia, unspecified: Secondary | ICD-10-CM | POA: Diagnosis present

## 2014-02-24 DIAGNOSIS — I4891 Unspecified atrial fibrillation: Secondary | ICD-10-CM

## 2014-02-24 DIAGNOSIS — D72819 Decreased white blood cell count, unspecified: Secondary | ICD-10-CM | POA: Diagnosis present

## 2014-02-24 DIAGNOSIS — M542 Cervicalgia: Secondary | ICD-10-CM | POA: Diagnosis present

## 2014-02-24 DIAGNOSIS — D638 Anemia in other chronic diseases classified elsewhere: Secondary | ICD-10-CM | POA: Diagnosis present

## 2014-02-24 DIAGNOSIS — D649 Anemia, unspecified: Secondary | ICD-10-CM | POA: Diagnosis present

## 2014-02-24 DIAGNOSIS — R748 Abnormal levels of other serum enzymes: Secondary | ICD-10-CM | POA: Diagnosis present

## 2014-02-24 LAB — COMPREHENSIVE METABOLIC PANEL
ALT: 6 U/L (ref 0–35)
AST: 21 U/L (ref 0–37)
Albumin: 2.9 g/dL — ABNORMAL LOW (ref 3.5–5.2)
Alkaline Phosphatase: 805 U/L — ABNORMAL HIGH (ref 39–117)
BUN: 22 mg/dL (ref 6–23)
CO2: 22 meq/L (ref 19–32)
Calcium: 8.5 mg/dL (ref 8.4–10.5)
Chloride: 102 mEq/L (ref 96–112)
Creatinine, Ser: 1.4 mg/dL — ABNORMAL HIGH (ref 0.50–1.10)
GFR, EST AFRICAN AMERICAN: 42 mL/min — AB (ref >90)
GFR, EST NON AFRICAN AMERICAN: 37 mL/min — AB (ref >90)
Glucose, Bld: 192 mg/dL — ABNORMAL HIGH (ref 70–99)
POTASSIUM: 5.3 meq/L (ref 3.7–5.3)
SODIUM: 138 meq/L (ref 137–147)
TOTAL PROTEIN: 6.5 g/dL (ref 6.0–8.3)
Total Bilirubin: 0.6 mg/dL (ref 0.3–1.2)

## 2014-02-24 LAB — CBC WITH DIFFERENTIAL/PLATELET
BASOS ABS: 0 10*3/uL (ref 0.0–0.1)
BASOS PCT: 1 % (ref 0–1)
EOS ABS: 0 10*3/uL (ref 0.0–0.7)
EOS PCT: 1 % (ref 0–5)
HCT: 29.9 % — ABNORMAL LOW (ref 36.0–46.0)
Hemoglobin: 9.7 g/dL — ABNORMAL LOW (ref 12.0–15.0)
Lymphocytes Relative: 25 % (ref 12–46)
Lymphs Abs: 0.5 10*3/uL — ABNORMAL LOW (ref 0.7–4.0)
MCH: 25.1 pg — ABNORMAL LOW (ref 26.0–34.0)
MCHC: 32.4 g/dL (ref 30.0–36.0)
MCV: 77.3 fL — ABNORMAL LOW (ref 78.0–100.0)
MONO ABS: 0.2 10*3/uL (ref 0.1–1.0)
Monocytes Relative: 8 % (ref 3–12)
NEUTROS ABS: 1.1 10*3/uL — AB (ref 1.7–7.7)
Neutrophils Relative %: 64 % (ref 43–77)
Platelets: 59 10*3/uL — ABNORMAL LOW (ref 150–400)
RBC: 3.87 MIL/uL (ref 3.87–5.11)
RDW: 16.2 % — AB (ref 11.5–15.5)
WBC: 1.8 10*3/uL — ABNORMAL LOW (ref 4.0–10.5)

## 2014-02-24 LAB — GLUCOSE, CAPILLARY
GLUCOSE-CAPILLARY: 145 mg/dL — AB (ref 70–99)
Glucose-Capillary: 223 mg/dL — ABNORMAL HIGH (ref 70–99)
Glucose-Capillary: 145 mg/dL — ABNORMAL HIGH (ref 70–99)

## 2014-02-24 LAB — LIPASE, BLOOD: Lipase: 59 U/L (ref 11–59)

## 2014-02-24 LAB — LACTATE DEHYDROGENASE
LDH: 1745 U/L — ABNORMAL HIGH (ref 94–250)
LDH: 1877 U/L — ABNORMAL HIGH (ref 94–250)

## 2014-02-24 LAB — URINE CULTURE

## 2014-02-24 LAB — PROCALCITONIN: Procalcitonin: 1.98 ng/mL

## 2014-02-24 LAB — GAMMA GT: GGT: 29 U/L (ref 7–51)

## 2014-02-24 LAB — HEPATIC FUNCTION PANEL
ALK PHOS: 790 U/L — AB (ref 39–117)
ALT: 6 U/L (ref 0–35)
AST: 17 U/L (ref 0–37)
Albumin: 2.8 g/dL — ABNORMAL LOW (ref 3.5–5.2)
BILIRUBIN DIRECT: 0.2 mg/dL (ref 0.0–0.3)
BILIRUBIN INDIRECT: 0.3 mg/dL (ref 0.3–0.9)
BILIRUBIN TOTAL: 0.5 mg/dL (ref 0.3–1.2)
Total Protein: 6.4 g/dL (ref 6.0–8.3)

## 2014-02-24 LAB — TROPONIN I
Troponin I: <0.3 ng/mL (ref <0.30)
Troponin I: <0.3 ng/mL (ref <0.30)
Troponin I: <0.3 ng/mL (ref <0.30)
Troponin I: <0.3 ng/mL (ref <0.30)

## 2014-02-24 LAB — SEDIMENTATION RATE: SED RATE: 61 mm/h — AB (ref 0–22)

## 2014-02-24 LAB — TSH: TSH: 0.782 u[IU]/mL (ref 0.350–4.500)

## 2014-02-24 LAB — MRSA PCR SCREENING: MRSA BY PCR: NEGATIVE

## 2014-02-24 LAB — LACTIC ACID, PLASMA: Lactic Acid, Venous: 1.5 mmol/L (ref 0.5–2.2)

## 2014-02-24 LAB — KETONES, QUALITATIVE: Acetone, Bld: NEGATIVE

## 2014-02-24 MED ORDER — NITROGLYCERIN 0.4 MG SL SUBL: 0.4000 mg | SUBLINGUAL_TABLET | SUBLINGUAL | Status: DC | PRN

## 2014-02-24 MED ORDER — BOOST / RESOURCE BREEZE PO LIQD
1.0000 | Freq: Three times a day (TID) | ORAL | Status: DC
  Administered 2014-02-24 – 2014-03-02 (×10): 1 via ORAL

## 2014-02-24 MED ORDER — OXYCODONE HCL 5 MG PO TABS
5.0000 mg | ORAL_TABLET | ORAL | Status: DC | PRN
  Administered 2014-02-24 – 2014-02-26 (×11): 5 mg via ORAL
  Filled 2014-02-24 (×11): qty 1

## 2014-02-24 MED ORDER — ACETAMINOPHEN 650 MG RE SUPP: 650.0000 mg | Freq: Four times a day (QID) | RECTAL | Status: DC | PRN

## 2014-02-24 MED ORDER — LORAZEPAM 0.5 MG PO TABS
0.5000 mg | ORAL_TABLET | Freq: Four times a day (QID) | ORAL | Status: DC | PRN
  Administered 2014-02-25 – 2014-03-02 (×5): 0.5 mg via ORAL
  Filled 2014-02-24 (×5): qty 1

## 2014-02-24 MED ORDER — ONDANSETRON HCL 4 MG PO TABS: 4.0000 mg | ORAL_TABLET | Freq: Four times a day (QID) | ORAL | Status: DC | PRN

## 2014-02-24 MED ORDER — VANCOMYCIN HCL IN DEXTROSE 1-5 GM/200ML-% IV SOLN: 1000.0000 mg | INTRAVENOUS | Status: DC

## 2014-02-24 MED ORDER — VANCOMYCIN HCL 10 G IV SOLR
1500.0000 mg | Freq: Once | INTRAVENOUS | Status: AC
  Administered 2014-02-24: 1500 mg via INTRAVENOUS
  Filled 2014-02-24: qty 1500

## 2014-02-24 MED ORDER — OXYCODONE HCL ER 10 MG PO T12A
10.0000 mg | EXTENDED_RELEASE_TABLET | Freq: Two times a day (BID) | ORAL | Status: DC
  Administered 2014-02-24 – 2014-02-25 (×3): 10 mg via ORAL
  Filled 2014-02-24 (×3): qty 1

## 2014-02-24 MED ORDER — HYDROMORPHONE HCL PF 1 MG/ML IJ SOLN
1.0000 mg | Freq: Once | INTRAMUSCULAR | Status: AC
  Administered 2014-02-24: 1 mg via INTRAVENOUS
  Filled 2014-02-24: qty 1

## 2014-02-24 MED ORDER — FENTANYL CITRATE 0.05 MG/ML IJ SOLN
50.0000 ug | INTRAMUSCULAR | Status: DC | PRN
  Administered 2014-02-24 (×3): 50 ug via INTRAVENOUS
  Filled 2014-02-24 (×3): qty 2

## 2014-02-24 MED ORDER — VANCOMYCIN HCL IN DEXTROSE 1-5 GM/200ML-% IV SOLN
1000.0000 mg | INTRAVENOUS | Status: DC
  Administered 2014-02-25: 1000 mg via INTRAVENOUS
  Filled 2014-02-24 (×2): qty 200

## 2014-02-24 MED ORDER — ONDANSETRON HCL 4 MG/2ML IJ SOLN: 4.0000 mg | Freq: Four times a day (QID) | INTRAMUSCULAR | Status: DC | PRN

## 2014-02-24 MED ORDER — GADOBENATE DIMEGLUMINE 529 MG/ML IV SOLN
10.0000 mL | Freq: Once | INTRAVENOUS | Status: AC
  Administered 2014-02-24: 8 mL via INTRAVENOUS

## 2014-02-24 MED ORDER — ACETAMINOPHEN 325 MG PO TABS: 650.0000 mg | ORAL_TABLET | Freq: Four times a day (QID) | ORAL | Status: DC | PRN

## 2014-02-24 MED ORDER — SODIUM CHLORIDE 0.9 % IJ SOLN
3.0000 mL | Freq: Two times a day (BID) | INTRAMUSCULAR | Status: DC
  Administered 2014-02-24 – 2014-02-28 (×8): 3 mL via INTRAVENOUS

## 2014-02-24 MED ORDER — ASPIRIN 325 MG PO TABS
325.0000 mg | ORAL_TABLET | Freq: Every day | ORAL | Status: DC
  Administered 2014-02-24 – 2014-02-28 (×5): 325 mg via ORAL
  Filled 2014-02-24 (×6): qty 1

## 2014-02-24 MED ORDER — SODIUM CHLORIDE 0.9 % IV SOLN
INTRAVENOUS | Status: AC
  Administered 2014-02-24 (×2): via INTRAVENOUS

## 2014-02-24 MED ORDER — DEXTROSE 5 % IV SOLN
1.0000 g | Freq: Every day | INTRAVENOUS | Status: DC
  Administered 2014-02-24 (×2): 1 g via INTRAVENOUS
  Filled 2014-02-24 (×3): qty 1

## 2014-02-24 MED ORDER — INSULIN ASPART 100 UNIT/ML ~~LOC~~ SOLN
0.0000 [IU] | Freq: Three times a day (TID) | SUBCUTANEOUS | Status: DC
  Administered 2014-02-24: 1 [IU] via SUBCUTANEOUS
  Administered 2014-02-24: 3 [IU] via SUBCUTANEOUS
  Administered 2014-02-24: 1 [IU] via SUBCUTANEOUS
  Administered 2014-02-25: 3 [IU] via SUBCUTANEOUS
  Administered 2014-02-25: 2 [IU] via SUBCUTANEOUS
  Administered 2014-02-25: 1 [IU] via SUBCUTANEOUS
  Administered 2014-02-26 (×3): 2 [IU] via SUBCUTANEOUS
  Administered 2014-02-27 (×3): 3 [IU] via SUBCUTANEOUS
  Administered 2014-02-28 (×2): 2 [IU] via SUBCUTANEOUS
  Administered 2014-02-28: 3 [IU] via SUBCUTANEOUS
  Administered 2014-03-01: 7 [IU] via SUBCUTANEOUS
  Administered 2014-03-01 – 2014-03-02 (×2): 2 [IU] via SUBCUTANEOUS
  Administered 2014-03-02: 3 [IU] via SUBCUTANEOUS

## 2014-02-24 MED ORDER — ONDANSETRON HCL 4 MG/2ML IJ SOLN: 4.0000 mg | Freq: Three times a day (TID) | INTRAMUSCULAR | Status: DC | PRN

## 2014-02-24 MED ORDER — PANTOPRAZOLE SODIUM 40 MG PO TBEC
40.0000 mg | DELAYED_RELEASE_TABLET | Freq: Every day | ORAL | Status: DC
  Administered 2014-02-24 – 2014-03-02 (×7): 40 mg via ORAL
  Filled 2014-02-24 (×7): qty 1

## 2014-02-24 MED ORDER — AMLODIPINE BESYLATE 5 MG PO TABS
5.0000 mg | ORAL_TABLET | Freq: Every day | ORAL | Status: DC
  Administered 2014-02-24 – 2014-03-02 (×7): 5 mg via ORAL
  Filled 2014-02-24 (×8): qty 1

## 2014-02-24 MED ORDER — METOPROLOL TARTRATE 12.5 MG HALF TABLET
12.5000 mg | ORAL_TABLET | Freq: Two times a day (BID) | ORAL | Status: DC
  Administered 2014-02-24 – 2014-02-25 (×4): 12.5 mg via ORAL
  Filled 2014-02-24 (×7): qty 1

## 2014-02-24 MED ORDER — SIMVASTATIN 20 MG PO TABS
20.0000 mg | ORAL_TABLET | Freq: Every evening | ORAL | Status: DC
  Administered 2014-02-24 – 2014-03-02 (×7): 20 mg via ORAL
  Filled 2014-02-24 (×8): qty 1

## 2014-02-24 NOTE — Progress Notes (Signed)
Returned from MRI 

## 2014-02-24 NOTE — Progress Notes (Signed)
[facility]  [name] [id number] DOB: 24-Mar-1941 DOA: 02/23/2014 PCP: [name], MD  Brief narrative: 73 year old female patient with multiple medical problems. Was sent to the ER with complaints of low back pain and abdominal pain associated with fever. Patient has been admitted twice in 2015 with complaints related to pancreatitis-type symptoms and was found to have a pancreatic cyst. Since discharge she was contacted by the gastroenterology office for previous a discussion and has plans to to formally see the physician in April. During previous admission she had chest discomfort and underwent a stress test that was negative. Essentially since last discharge patient has not improved in her symptomatology. Over the past week she had been having increasing abdominal pain that is now associated with a Back pain. She denied vomiting but endorses significant nausea and anorexia and inability to keep. No diarrhea. In the ER temperature was 102. She spent in atrial fibrillation with RVR. She had lactic acidosis. Symptoms did improve after administration of 2 L of saline. CT of the abdomen and pelvis was unremarkable. Lumbar MRI demonstrated disc protrusions without compressions or evidence of discitis or osteomyelitis in this region.  Assessment/Plan: Active Problems:   Sepsis -Source unclear but patient did have significant fever presentation -Lactic acid has decreased from 3.07 to 1.5 since admission -Procalcitonin elevated at 1.98 and based on the algorithm, broad-spectrum antibiotics should be continued until source is identified -Urinalysis, chest x-ray and CT the abdomen unrevealing -Upon additional examination this morning patient endorsed significant cervical/upper thoracic back discomfort on exam so concerned this may be source (see below) -Continue empiric antibiotic coverage -Followup on blood cultures -Influenza PCR  pending -ESR elevated at 61 which is indicative of some sort of inflammatory and/or infectious process    Cervical spine pain -Check MRI of cervical and thoracic spine -No neurological deficits appreciated on exam -Significant pain so adding long-acting narcotics with short acting oral and give a one-time dose of IV Dilaudid    Alkaline phosphatase elevation -Markedly elevated and greater than 700  -Etiology unclear but differential includes possible malignancy, possible bone resorption issue, infection, or related to atypical presentation of biliary disease -LFTs are normal (including gamma GT which is 96) but daughter reported that during abdominal ultrasound in the ER patient was very tender in right upper quadrant and endorsed family history of atypical presentation of biliary and pancreas issues -Possible marker of malignancy, noting calcium is normal even when corrected for low albumin, no protein in urine -Consider heme/onc evaluation if unable to clarify -TSH 0.72    Leukopenia/Thrombocytopenia (? Pancytopenia) -Unclear etiology -Check LDH and haptoglobin -Possibly marker for sepsis and significant infection -Patient had new thrombocytopenia last admission which has not resolved and is actually worsened somewhat    DIABETES MELLITUS, TYPE II -Variable control this admission -Continue sliding scale insulin-adjust as needed -Was on Amaryl and Glucophage at home but will hold for now -Consider inpatient use of long acting insulin in place at [facility] during acute illness    HYPERTENSION -Poorly controlled suspect related to pain -Continue antihypertensive medications    Lesion of pancreas -MRCP last admission revealed a cystic lesion in the head of the pancreas that might be communicating with a pancreatic duct in addition there was postinflammatory cystic change -CT abdomen and pelvis this admission revealed stable cystic changes of the pancreas when compared to prior  MRI/MRCP -CA 19.9 was was low on 01/31/2014 -No pancreatic abnormality seen on CT  abdomen and pelvis 2008 -Noted calcific density of the pancreas that could be related to vascular disease or chronic pancreatitis -Lipase normal this admission -Has not had a hepatobiliary scan to rule out atypical presentation of chronic cholecystitis -Continue to monitor for and work this problem up while she is in the hospital noting unclear if this is contributing to current symptoms -Pending results of cervical/thoracic spine MRI may need to consult gastroenterology this admission    Cirrhosis of liver without mention of alcohol/mild splenomegaly -Finding on MRCP this admission -CT abdomen and pelvis from 2008 without evidence of cirrhosis    Atrial fibrillation with RVR -Likely RVR precipitated by fever at presentation as well as pain -Currently only mildly tachycardic with sinus etiology -Continue beta blocker    HYPERLIPIDEMIA    CAD, non obstructive in '02 -Negative stress test last admission -TNI negative x3 this admission an EKG has been nonischemic    CKD (chronic kidney disease), stage III -Renal function remains stable    Anemia -Hemoglobin was 11.3 at presentation and after hydration has decreased to 9.7 so likely reflective of patient's volume status since baseline appears to be between 9.5 and 10 point   DVT prophylaxis: SCDs Code Status: Full Family Communication: Spoke with patient's daughter over the telephone. Reassured her that extensive workup will be undertaken this admission. Daughter is very frightened that patient may be experiencing an atypical presentation for malignancy or cholecystitis given family history of multiple family members with atypical presentations for malignancy and/or gallbladder disease. Disposition Plan/Expected LOS: Remain in step down   Consultants: None  Procedures: None  Antibiotics: Cefepime 3/19 >>> Zosyn 3/19 >>> Vancomycin 3/19  >>>  HPI/Subjective: Patient alert and quite restless in the bed. Endorses extensive discomfort from neck down to upper buttocks. States she is unable to get relief of pain with current medication regimen. Currently denies chest pain shortness of breath. Still having some mild low abnormal pain. Denies upper abdominal pain  Objective: Blood pressure 168/92, pulse 104, temperature 98 F (36.7 C), temperature source Oral, resp. rate 32, height _0  (1.575 m), weight 179 lb 0.2 oz (81.2 kg), SpO2 97.00%.  Intake/Output Summary (Last 24 hours) at 02/24/14 1326 Last data filed at 02/24/14 8182  Gross per 24 hour  Intake    895 ml  Output    250 ml  Net    645 ml     Exam: General: No acute respiratory distress-very restless due to complaints of significant pain Lungs: Clear to auscultation bilaterally without wheezes or crackles, RA Cardiovascular: Regular slightly tachycardic rate and rhythm without murmur gallop or rub normal S1 and S2, no peripheral edema or JVD Abdomen: Normal tender lower abdomen without guarding and no specific focal pain, nondistended, soft, bowel sounds positive, no rebound, no ascites, no appreciable mass Musculoskeletal: No significant cyanosis, clubbing of bilateral lower extremities-very tender upper neck and back without nuchal rigidity Neurological: Alert and oriented x 3, moves all extremities x 4 without focal neurological deficits, CN 2-12 intact  Scheduled Meds:  Scheduled Meds: . amLODipine  5 mg Oral Daily  . aspirin  325 mg Oral Daily  . ceFEPime (MAXIPIME) IV  1 g Intravenous QHS  . feeding supplement (RESOURCE BREEZE)  1 Container Oral TID BM  . insulin aspart  0-9 Units Subcutaneous TID WC  . metoprolol tartrate  12.5 mg Oral BID  . OxyCODONE  10 mg Oral Q12H  . pantoprazole  40 mg Oral Daily  . simvastatin  20 mg  Oral QPM  . sodium chloride  3 mL Intravenous Q12H  . [START ON 02/25/2014] vancomycin  1,000 mg Intravenous Q24H   Continuous  Infusions: . sodium chloride 125 mL/hr at 02/24/14 0230    Data Reviewed: Basic Metabolic Panel:  Recent Labs Lab 02/22/14 0406 02/23/14 1746 02/24/14 0311  NA 136* 133* 138  K 5.3 4.7 5.3  CL 96 94* 102  CO2 _0 GLUCOSE 205* 247* 192*  BUN 23 25* 22  CREATININE 1.59* 1.79* 1.40*  CALCIUM 9.0 8.8 8.5   Liver Function Tests:  Recent Labs Lab 02/22/14 0406 02/24/14 0040 02/24/14 0311  AST _1 ALT _2 ALKPHOS 741* 790* 805*  BILITOT 0.5 0.5 0.6  PROT 7.4 6.4 6.5  ALBUMIN 3.5 2.8* 2.9*    Recent Labs Lab 02/22/14 0540 02/24/14 0040  LIPASE 31 59   No results found for this basename: AMMONIA,  in the last 168 hours CBC:  Recent Labs Lab 02/22/14 0406 02/23/14 1746 02/24/14 0311  WBC 3.3* 2.6* 1.8*  NEUTROABS 2.0  --  1.1*  HGB 11.3* 11.0* 9.7*  HCT 35.1* 33.7* 29.9*  MCV 78.5 77.8* 77.3*  PLT 91* 85* 59*   Cardiac Enzymes:  Recent Labs Lab 02/24/14 0040 02/24/14 0311 02/24/14 0900  TROPONINI <0.30 <0.30 <0.30   BNP (last 3 results) No results found for this basename: PROBNP,  in the last 8760 hours CBG:  Recent Labs Lab 02/24/14 0844  GLUCAP 223*    Recent Results (from the past 240 hour(s))  MRSA PCR SCREENING     Status: None   Collection Time    02/24/14  3:00 AM      Result Value Ref Range Status   MRSA by PCR NEGATIVE  NEGATIVE Final   Comment:            The GeneXpert MRSA Assay (FDA     approved for NASAL specimens     only), is one component of a     comprehensive MRSA colonization     surveillance program. It is not     intended to diagnose MRSA     infection nor to guide or     monitor treatment for     MRSA infections.     Studies:  Recent x-ray studies have been reviewed in detail by the Attending Physician  Time spent :     Erin Hearing, [place] [facility] Office  [phone] Pager [phone]  **If unable to reach the above provider after paging please contact the [place] @  [phone]  On-Call/Text Page:      [email or web address]      password [email or web address]  If 7PM-7AM, please contact night-coverage [email or web address] Password [email or web address] 02/24/2014, 1:26 PM   LOS: 1 day

## 2014-02-24 NOTE — H&P (Addendum)
[facility] History and Physical  [name] [id number] DOB: 22-Nov-1941 DOA: 02/23/2014  Referring physician: ER physician. PCP: [name], MD   Chief Complaint: Low back pain and abdominal pain.  HPI: [name] is a 73 y.o. female with history of paroxysmal atrial fibrillation, chronic kidney disease, hypertension, diabetes mellitus was brought to the ER after patient was referred by patient's PCP for increasing low back pain and abdominal pain with fever. Patient was recently admitted twice for pancreatitis with pancreatic cyst and chest pain and at that time patient had stress test which was negative. Patient states that over the last one week patient has been having increasing abdominal pain and last 2-3 days has been having low back pain. Patient denies any vomiting but does have nausea with poor appetite. Denies any diarrhea. Denies any incontinence of urine or bowel. Denies any headache. In the ER patient was found to be febrile with temperature of 102F and was found to be in A. fib with RVR. Patient's lactic acid level was elevated and the patient's heart rate improved with 2 L normal saline bolus. CT abdomen pelvis done without contrast shows nothing significant from known patient's pancreatic cyst. Lumbar MRI shows disc protrusions and no compressions but no cord compression or discitis osteomyelitis.   Review of Systems: As presented in the history of presenting illness, rest negative.  Past Medical History  Diagnosis Date  . DIABETES MELLITUS, TYPE II 07/13/2007  . HYPERLIPIDEMIA 12/24/2007  . HYPERTENSION 07/13/2007  . CORONARY ARTERY DISEASE 12/24/2007  . NEPHROLITHIASIS, HX OF 12/24/2007  . Colon polyps 04/29/2010    TUBULAR ADENOMA AND A SERRATED ADENOMA  . Acute pancreatitis   . Unspecified disease of pancreas   . Chronic kidney disease, stage II (mild)    Past Surgical History  Procedure Laterality Date  . Excisional hemorrhoidectomy  unknown  .  Abdominal hysterectomy  unknown  . Nasal sinus surgery  unknown  . Cardiac catheterization  2002  . Cataract extraction Bilateral    Social History:  reports that she quit smoking about 13 years ago. Her smoking use included Cigarettes. She smoked 0.00 packs per day. She has never used smokeless tobacco. She reports that she does not drink alcohol or use illicit drugs. Where does patient live home. Can patient participate in ADLs? Yes.  No Known Allergies  Family History:  Family History  Problem Relation Age of Onset  . Cancer Father     throat ca  . COPD Father   . Cancer Sister     uterine ca and breast ca  . Cancer Brother     throat      Prior to Admission medications   Medication Sig Start Date End Date Taking? Authorizing Provider  amLODipine (NORVASC) 5 MG tablet Take 5 mg by mouth daily.   Yes Historical Provider, MD  aspirin 325 MG tablet Take 325 mg by mouth daily.    Yes Historical Provider, MD  glimepiride (AMARYL) 4 MG tablet Take 4 mg by mouth daily before breakfast.   Yes Historical Provider, MD  HYDROcodone-acetaminophen (NORCO/VICODIN) 5-325 MG per tablet Take 1 tablet by mouth every 6 (six) hours as needed for moderate pain or severe pain. 02/06/14  Yes Ripudeep Krystal Eaton, MD  LORazepam (ATIVAN) 0.5 MG tablet Take 1 tablet (0.5 mg total) by mouth every 6 (six) hours as needed for anxiety. 07/04/13  Yes Marletta Lor, MD  meloxicam (MOBIC) 7.5 MG tablet Take 1 tablet (7.5 mg total)  by mouth daily. 08/26/13  Yes Marletta Lor, MD  metFORMIN (GLUCOPHAGE) 1000 MG tablet Take 1 tablet (1,000 mg total) by mouth 2 (two) times daily with a meal. 01/20/14  Yes Marletta Lor, MD  oxyCODONE-acetaminophen (PERCOCET) 7.5-325 MG per tablet Take 1 tablet by mouth every 4 (four) hours as needed for pain. 02/22/14  Yes Johnna Acosta, MD  pantoprazole (PROTONIX) 40 MG tablet Take 1 tablet (40 mg total) by mouth daily. 02/06/14  Yes Ripudeep Krystal Eaton, MD  promethazine  (PHENERGAN) 12.5 MG tablet Take 2 tablets (25 mg total) by mouth every 6 (six) hours as needed for nausea. 05/10/13  Yes Elwyn Lade, PA-C  simvastatin (ZOCOR) 20 MG tablet Take 20 mg by mouth every evening.   Yes Historical Provider, MD  metoprolol tartrate (LOPRESSOR) 25 MG tablet Take metoprolol 12.5 mg (split 25mg  tab to half) oral BID (twice a day). 02/06/14   Ripudeep Krystal Eaton, MD  nitroGLYCERIN (NITROSTAT) 0.4 MG SL tablet Place 1 tablet (0.4 mg total) under the tongue every 5 (five) minutes as needed for chest pain. 02/06/14   Ripudeep Krystal Eaton, MD    Physical Exam: Filed Vitals:   02/24/14 0000 02/24/14 0015 02/24/14 0030 02/24/14 0106  BP: 138/62 142/65 145/63 152/59  Pulse: 106 105 104 105  Temp:      TempSrc:      Resp: 18 20 16 20   Height:      Weight:      SpO2: 92% 93% 95% 95%     General:  Well-developed well-nourished.  Eyes: Anicteric no pallor.  ENT: No discharge from the ears eyes nose mouth.  Neck: No mass felt.  Cardiovascular: S1-S2 heard.  Respiratory: No rhonchi or crepitations.  Abdomen: Soft mild tenderness in the epigastric area no guarding or rigidity.  Skin: No rash.  Musculoskeletal: No edema.  Psychiatric: Appears normal.  Neurologic: Alert awake oriented to time place and person. Moves all extremities.  Labs on Admission:  Basic Metabolic Panel:  Recent Labs Lab 02/22/14 0406 02/23/14 1746  NA 136* 133*  K 5.3 4.7  CL 96 94*  CO2 21 19  GLUCOSE 205* 247*  BUN 23 25*  CREATININE 1.59* 1.79*  CALCIUM 9.0 8.8   Liver Function Tests:  Recent Labs Lab 02/22/14 0406 02/24/14 0040  AST 17 17  ALT 7 6  ALKPHOS 741* 790*  BILITOT 0.5 0.5  PROT 7.4 6.4  ALBUMIN 3.5 2.8*    Recent Labs Lab 02/22/14 0540 02/24/14 0040  LIPASE 31 59   No results found for this basename: AMMONIA,  in the last 168 hours CBC:  Recent Labs Lab 02/22/14 0406 02/23/14 1746  WBC 3.3* 2.6*  NEUTROABS 2.0  --   HGB 11.3* 11.0*  HCT 35.1*  33.7*  MCV 78.5 77.8*  PLT 91* 85*   Cardiac Enzymes:  Recent Labs Lab 02/24/14 0040  TROPONINI <0.30    BNP (last 3 results) No results found for this basename: PROBNP,  in the last 8760 hours CBG: No results found for this basename: GLUCAP,  in the last 168 hours  Radiological Exams on Admission: Ct Abdomen Pelvis Wo Contrast  02/23/2014   CLINICAL DATA:  Low back pain.  EXAM: CT ABDOMEN AND PELVIS WITHOUT CONTRAST  TECHNIQUE: Multidetector CT imaging of the abdomen and pelvis was performed following the standard protocol without intravenous contrast.  COMPARISON:  MRI 01/31/2014.  FINDINGS: No focal hepatic abnormality. Slight hepatic irregularity noted. Cirrhosis could present this  fashion. Mild splenomegaly. Splenosis. No biliary distention. Cystic changes in the head of pancreas for better imaged with MRI, represents made to prior MRI report. Calcific densities noted in the pancreas could be related to vascular disease and/or chronic pancreatic inflammatory change. Gallbladder is nondistended. No biliary distention.  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 The otherwise unremarkable. No focal renal abnormalities identified. Renal vascular calcification present. No hydronephrosis or obstructing ureteral stone. Bladder is nondistended.  Hysterectomy. Tiny 1.5 cm left ovarian cyst noted. No free pelvic fluid.  No significant adenopathy. Abdominal aorta is atherosclerotic. No aneurysm. Distal vessels are atherosclerotic.  Appendix normal. No inflammatory changes are present in the right lower quadrant. Terminal ileum appears normal. No inflammatory changes left lower quadrant. No significant bowel distention. No free air. No mesenteric mass lesions noted. Small nodular opacities noted in the region of the gastrohepatic ligament may represent perigastric varices  Lung bases are clear. Coronary artery disease. Abdominal wall is intact. No significant  hernia. Lipoma noted along the distal ileal psoas muscle on the right. No acute bony abnormality. Degenerative changes lumbar spine. Degenerative changes both hips.  IMPRESSION: 1. Slight hepatic irregularity with splenomegaly. These findings suggest the possibility of cirrhosis. Small perigastric varices cannot be excluded.  2. Cystic changes previously noted in the pancreas on prior MRI better demonstrated by MRI and continued follow-up as recommended on prior MRI report.  3. Calcific density pancreas may be related to vascular disease however chronic pancreatitis cannot be excluded.  4.  Small left ovarian cyst.  5.  Coronary artery disease.  .   Electronically Signed   By: [place]   On: 02/23/2014 23:30   Mr Lumbar Spine Wo Contrast  02/23/2014   CLINICAL DATA:  Back pain, concern for osteomyelitis/discitis.  EXAM: MRI LUMBAR SPINE WITHOUT CONTRAST  TECHNIQUE: Multiplanar, multisequence MR imaging was performed. No intravenous contrast was administered.  COMPARISON:  Prior MRI from 01/31/2014 and CT from 01/29/2014.  FINDINGS: For the purposes of this dictation, the lowest well-formed intervertebral disc spaces presumed to be the L5-S1 level, and there presumed to be 5 lumbar type vertebral bodies.  Mild straightening of the normal lumbar lordosis. There is no listhesis. Vertebral body heights are preserved. Degenerative endplate Schmorl's node present at the superior endplate of [id number].  Mild diffuse congenital shortening of the pedicles noted.  Signal intensity within the vertebral body bone marrow is normal. Signal intensity within the spinal cord is within normal limits as well. Conus medullaris terminates at the L1 level. No abnormal STIR signal seen within the intervertebral discs or vertebral bodies to suggest osteomyelitis/ discitis. No epidural fluid collection. Mildly prominent epidural fat noted within the lower lumbar spine.  At T11-12, seen only on sagittal projection. There is degenerative  disc bulging with bilateral facet arthrosis. The bulging disc appears to abut the ventral spinal cord with resultant moderate to severe central canal stenosis. No definite cord signal changes identified. Canal stenosis (series 5, image 6). Mild right foraminal narrowing is present this level as well.  At T12-L1, prominent degenerative endplate spurring seen anteriorly. There is mild disc desiccation without significant disc bulge. No focal disc protrusion or facet arthrosis. No canal or neural foraminal stenosis.  At L1-2, there is degenerative disc desiccation with mild diffuse disc bulge. Mild bilateral facet hypertrophy. No canal or neural foraminal stenosis.  At L2-3, there is degenerative disc desiccation with associated degenerative endplate changes anteriorly. Mild anterior osteophytic spurring. There  is mild disc bulge without focal disc protrusion. Mild bilateral facet hypertrophy and ligamentum flavum thickening. No canal or neural foraminal stenosis.  At L3-4, there is disc desiccation with mild diffuse disc bulge mild bilateral facet arthrosis. There is resultant moderate canal narrowing, largely due to short pedicles. There is no focal disc protrusion, although the bulging disc appears to abut the exiting left L3 nerve root as it courses out of the neural foramen (series 8, image 16). Neural foramina remain widely patent.  At L4-5, there is degenerative disc desiccation with mild-to-moderate bilateral facet hypertrophy and ligamentum flavum thickening. Moderate central canal stenosis present, largely due to short pedicles. There is mild left foraminal narrowing without significant right foraminal stenosis. No focal disc herniation at this level.  At L5-S1, there is degenerative disc desiccation with diffuse disc bulging and mild bilateral facet hypertrophy. A superimposed left foraminal disc protrusion is present, abutting the left L5 nerve root as it courses through the left neural foramen (series 8,  image 28). There is moderate left foraminal stenosis. No significant canal or right neural foraminal narrowing. Prominent epidural lipomatosis noted at this level.  Visualized paraspinous musculature is within normal limits. Visualized visceral structures are unremarkable. No retroperitoneal adenopathy.  IMPRESSION: 1. No MRI evidence of osteomyelitis/discitis or other infectious process within the lower back. 2. Right central disc bulge/protrusion at T11-12 with resultant moderate to severe central canal stenosis. No definite cord signal changes identified. 3. Left foraminal disc protrusion at L5-S1 impinging upon the left L5 nerve root and resulting and moderate left neural foraminal stenosis. 4. Mild degenerative disc bulging and facet arthrosis at L3-4 and L4-5, which superimposed a short pedicles results in moderate canal stenosis. At the L3-4 level, the bulging disc appears to abut the exiting left L3 nerve root as it courses out of the left neural foramen.   Electronically Signed   By: Jeannine Boga M.D.   On: 02/23/2014 22:51   Dg Chest Port 1 View  02/23/2014   CLINICAL DATA:  Fever.  EXAM: PORTABLE CHEST - 1 VIEW  COMPARISON:  Chest x-ray 01/29/2014 .  FINDINGS: Mediastinum and hilar structures are normal. Heart size upper limits normal, no pulmonary venous congestion. Minimal left base infiltrate cannot be excluded. No pleural effusion or pneumothorax. No acute bony abnormality. Degenerative changes thoracic spine and both shoulders.  IMPRESSION: Cannot exclude a minimal infiltrate in the left lung base.   Electronically Signed   By: Marcello Moores  Register   On: 02/23/2014 18:45    EKG: Independently reviewed. Atrial fibrillation with RVR.  Assessment/Plan Principal Problem:   Sepsis Active Problems:   DIABETES MELLITUS, TYPE II   HYPERLIPIDEMIA   HYPERTENSION   Chronic renal insufficiency, stage II (mild)   Atrial fibrillation with RVR   1. Sepsis - most likely source could be  intra-abdominal. CT abdomen pelvis done without contrast does not show anything acute. I have ordered a sonogram of the abdomen since patient's alkaline phosphatase is significantly elevated although AST ALT and bilirubin is normal. Other source of her increased alkaline phosphatase could be bone but MRI of the lumbar spine doesn't show any signs of discitis. Check GGT.Patient has been placed on empiric antibiotics for now. Follow blood cultures and continue hydration. 2. A fibrillation RVR - patient does have known history of paroxysmal atrial fibrillation. Atrial fibrillation with RVR probably precipitated by sepsis. Patient's heart rate has improved with fluid. Closely monitor for now continue metoprolol. Patient is not on anticoagulants but eventually will  need to be on but presently anticipating procedures we'll hold off any anticoagulation for now. 3. Diabetes mellitus type 2 uncontrolled with metabolic acidosis - have ordered acetone levels and repeat metabolic panel. If acetone levels are positive and it metabolic panel still shows anion gap then patient may need to be on insulin infusion. Holding off metformin for now due to chronic kidney disease. Patient is placed on sliding-scale coverage.Metabolic acidosis probably from lactic acidosis. 4. Pancytopenia - probably precipitated by sepsis. Closely follow CBC. 5. Low back pain - lumbar MRI shows disc bulging at the root compressions which will need eventually referred to orthopedics and physical therapy. Continue with pain relief medications. 6. Chronic renal insufficiency - closely follow intake output and metabolic panel. 7. Hyperlipidemia - on statins. 8. Recently admitted for pancreatitis with pancreatic cyst - CT abdomen and pelvis does not show anything acute. Lipase and LFT levels are pending.    Code Status: Full code.  Family Communication: Patient's daughter.  Disposition Plan: Admit to inpatient.    KAKRAKANDY,ARSHAD N. [facility] Pager [phone].  If 7PM-7AM, please contact night-coverage [email or web address] Password [email or web address] 02/24/2014, 1:18 AM

## 2014-02-24 NOTE — Progress Notes (Signed)
INITIAL NUTRITION ASSESSMENT  DOCUMENTATION CODES Per approved criteria  -Obesity Unspecified   INTERVENTION: - Resource Breeze TID, each provides 250 kcal, 9 g protein - Encourage adequate intake of meals and supplements  NUTRITION DIAGNOSIS: Inadequate oral intake related to pain as evidenced by minimal PO intake.   Goal: Patient will meet >/=90% of estimated nutrition needs  Monitor:  PO intake of meals and supplements, weight, labs, I/Os  Reason for Assessment: Malnutrition screening tool  73 y.o. female  Admitting Dx: Sepsis  ASSESSMENT: Patient with history of atrial fibrillation, CKD, HTN, diabetes, admitted with lower back and abdominal pain with fever. She does have 2 recent admits for pancreatitis with pancreatic cyst. Patient reports that she has not been eating much due to being in pain. Patient has lost about 20 pounds in the last 7 months, but this is intentional. She is open to trying nutrition supplements.   Height: Ht Readings from Last 1 Encounters:  02/24/14 5\' 2"  (1.575 m)    Weight: Wt Readings from Last 1 Encounters:  02/24/14 179 lb 0.2 oz (81.2 kg)    Ideal Body Weight: 110 pounds  % Ideal Body Weight: 163%  Wt Readings from Last 10 Encounters:  02/24/14 179 lb 0.2 oz (81.2 kg)  02/23/14 177 lb (80.287 kg)  02/22/14 175 lb (79.379 kg)  02/09/14 180 lb (81.647 kg)  02/04/14 180 lb (81.647 kg)  01/30/14 180 lb (81.647 kg)  10/31/13 190 lb (86.183 kg)  07/21/13 198 lb (89.812 kg)  04/21/13 210 lb (95.255 kg)  12/06/12 198 lb (89.812 kg)    Usual Body Weight: 200 pounds  % Usual Body Weight: 90%  BMI:  Body mass index is 32.73 kg/(m^2). Patient is obese, class I  Estimated Nutritional Needs: Kcal: 1550-1650 kcal Protein: 80-95 g Fluid: >2.4 L/day  Skin: Intact  Diet Order:  Carb modified, heart healthy  EDUCATION NEEDS: -No education needs identified at this time   Intake/Output Summary (Last 24 hours) at 02/24/14  1314 Last data filed at 02/24/14 0437  Gross per 24 hour  Intake    895 ml  Output    250 ml  Net    645 ml    Last BM: PTA   Labs:   Recent Labs Lab 02/22/14 0406 02/23/14 1746 02/24/14 0311  NA 136* 133* 138  K 5.3 4.7 5.3  CL 96 94* 102  CO2 21 19 22   BUN 23 25* 22  CREATININE 1.59* 1.79* 1.40*  CALCIUM 9.0 8.8 8.5  GLUCOSE 205* 247* 192*    CBG (last 3)   Recent Labs  02/24/14 0844  GLUCAP 223*    Scheduled Meds: . amLODipine  5 mg Oral Daily  . aspirin  325 mg Oral Daily  . ceFEPime (MAXIPIME) IV  1 g Intravenous QHS  . insulin aspart  0-9 Units Subcutaneous TID WC  . metoprolol tartrate  12.5 mg Oral BID  . OxyCODONE  10 mg Oral Q12H  . pantoprazole  40 mg Oral Daily  . simvastatin  20 mg Oral QPM  . sodium chloride  3 mL Intravenous Q12H  . [START ON 02/25/2014] vancomycin  1,000 mg Intravenous Q24H    Continuous Infusions: . sodium chloride 125 mL/hr at 02/24/14 0230    Past Medical History  Diagnosis Date  . DIABETES MELLITUS, TYPE II 07/13/2007  . HYPERLIPIDEMIA 12/24/2007  . HYPERTENSION 07/13/2007  . CORONARY ARTERY DISEASE 12/24/2007  . NEPHROLITHIASIS, HX OF 12/24/2007  . Colon polyps 04/29/2010  TUBULAR ADENOMA AND A SERRATED ADENOMA  . Acute pancreatitis   . Unspecified disease of pancreas   . Chronic kidney disease, stage II (mild)     Past Surgical History  Procedure Laterality Date  . Excisional hemorrhoidectomy  unknown  . Abdominal hysterectomy  unknown  . Nasal sinus surgery  unknown  . Cardiac catheterization  2002  . Cataract extraction Bilateral     Larey Seat, RD, LDN Pager #: [phone] [place] Pager #: [phone]

## 2014-02-24 NOTE — Progress Notes (Signed)
Utilization review completed. Jaidalyn Schillo, RN, BSN. 

## 2014-02-24 NOTE — ED Notes (Signed)
Report attempted x 1

## 2014-02-24 NOTE — Progress Notes (Signed)
To MRI via bed

## 2014-02-24 NOTE — Progress Notes (Addendum)
ANTIBIOTIC CONSULT NOTE - INITIAL  Pharmacy Consult for Vancomycin and Cefepime Indication: rule out sepsis  No Known Allergies  Patient Measurements: Height: 5' 1.81" (157 cm) Weight: 177 lb 0.5 oz (80.3 kg) IBW/kg (Calculated) : 49.67 Adjusted Body Weight: 65 kg  Vital Signs: Temp: 98 F (36.7 C) (03/20 0215) Temp src: Oral (03/20 0215) BP: 160/68 mmHg (03/20 0215) Pulse Rate: 103 (03/20 0215) Intake/Output from previous day:   Intake/Output from this shift:    Labs:  Recent Labs  02/22/14 0406 02/23/14 1746  WBC 3.3* 2.6*  HGB 11.3* 11.0*  PLT 91* 85*  CREATININE 1.59* 1.79*   Estimated Creatinine Clearance: 27.8 ml/min (by C-G formula based on Cr of 1.79). No results found for this basename: VANCOTROUGH, VANCOPEAK, VANCORANDOM, GENTTROUGH, GENTPEAK, GENTRANDOM, TOBRATROUGH, TOBRAPEAK, TOBRARND, AMIKACINPEAK, AMIKACINTROU, AMIKACIN,  in the last 72 hours   Microbiology: No results found for this or any previous visit (from the past 720 hour(s)).  Medications:  Scheduled:  . amLODipine  5 mg Oral Daily  . aspirin  325 mg Oral Daily  . insulin aspart  0-9 Units Subcutaneous TID WC  . metoprolol tartrate  12.5 mg Oral BID  . pantoprazole  40 mg Oral Daily  . piperacillin-tazobactam (ZOSYN)  IV  3.375 g Intravenous Q8H  . simvastatin  20 mg Oral QPM  . sodium chloride  3 mL Intravenous Q12H   Assessment: 73 yo female with abdominal pain/possible sepsis for empiric antibiotics  Goal of Therapy:  Vancomycin trough level 15-20 mcg/ml  Plan:  Vancomycin 1500 mg IV now, then 1 g IV q48h Cefepime 1 g IV q24h  Abbott, Bronson Curb 02/24/2014,2:39 AM   Addendum: SCr improved to 1.4, estimated CrCl 35 ml/min.   Change vancomycin to 1000 mg IV q24h  [facility], Pharm.D., BCPS Clinical Pharmacist Pager: [phone] 02/24/2014 11:29 AM

## 2014-02-24 NOTE — Progress Notes (Signed)
Patient seen, examined and discussed with my nurse practitioner. Agree with note.  Elevated alkaline phosphatase especially in setting of normal GGTP, concerning for malignancy. Given acute pain, certainly concerning for bone metastases.  Awaiting for MRI, and if unrevealing, bone scan.

## 2014-02-25 DIAGNOSIS — R9389 Abnormal findings on diagnostic imaging of other specified body structures: Secondary | ICD-10-CM | POA: Diagnosis present

## 2014-02-25 DIAGNOSIS — E119 Type 2 diabetes mellitus without complications: Secondary | ICD-10-CM

## 2014-02-25 LAB — COMPREHENSIVE METABOLIC PANEL
ALBUMIN: 2.7 g/dL — AB (ref 3.5–5.2)
ALT: 7 U/L (ref 0–35)
AST: 28 U/L (ref 0–37)
Alkaline Phosphatase: 1575 U/L — ABNORMAL HIGH (ref 39–117)
BUN: 19 mg/dL (ref 6–23)
CO2: 21 mEq/L (ref 19–32)
Calcium: 8.6 mg/dL (ref 8.4–10.5)
Chloride: 101 mEq/L (ref 96–112)
Creatinine, Ser: 1.12 mg/dL — ABNORMAL HIGH (ref 0.50–1.10)
GFR calc non Af Amer: 48 mL/min — ABNORMAL LOW (ref >90)
GFR, EST AFRICAN AMERICAN: 55 mL/min — AB (ref >90)
Glucose, Bld: 201 mg/dL — ABNORMAL HIGH (ref 70–99)
Potassium: 4.4 mEq/L (ref 3.7–5.3)
SODIUM: 136 meq/L — AB (ref 137–147)
TOTAL PROTEIN: 6.3 g/dL (ref 6.0–8.3)
Total Bilirubin: 0.4 mg/dL (ref 0.3–1.2)

## 2014-02-25 LAB — INFLUENZA PANEL BY PCR (TYPE A & B)
H1N1 flu by pcr: NOT DETECTED
INFLBPCR: NEGATIVE
Influenza A By PCR: NEGATIVE

## 2014-02-25 LAB — CBC
HCT: 29 % — ABNORMAL LOW (ref 36.0–46.0)
HEMOGLOBIN: 9.4 g/dL — AB (ref 12.0–15.0)
MCH: 25.1 pg — AB (ref 26.0–34.0)
MCHC: 32.4 g/dL (ref 30.0–36.0)
MCV: 77.3 fL — ABNORMAL LOW (ref 78.0–100.0)
Platelets: 52 10*3/uL — ABNORMAL LOW (ref 150–400)
RBC: 3.75 MIL/uL — ABNORMAL LOW (ref 3.87–5.11)
RDW: 16.5 % — AB (ref 11.5–15.5)
WBC: 2.4 10*3/uL — ABNORMAL LOW (ref 4.0–10.5)

## 2014-02-25 LAB — GLUCOSE, CAPILLARY
GLUCOSE-CAPILLARY: 144 mg/dL — AB (ref 70–99)
GLUCOSE-CAPILLARY: 249 mg/dL — AB (ref 70–99)
GLUCOSE-CAPILLARY: 241 mg/dL — AB (ref 70–99)
GLUCOSE-CAPILLARY: 246 mg/dL — AB (ref 70–99)
Glucose-Capillary: 155 mg/dL — ABNORMAL HIGH (ref 70–99)

## 2014-02-25 LAB — HAPTOGLOBIN: Haptoglobin: 387 mg/dL — ABNORMAL HIGH (ref 45–215)

## 2014-02-25 MED ORDER — BISACODYL 10 MG RE SUPP
10.0000 mg | Freq: Once | RECTAL | Status: AC
  Administered 2014-02-26: 10 mg via RECTAL
  Filled 2014-02-25: qty 1

## 2014-02-25 MED ORDER — POLYETHYLENE GLYCOL 3350 17 G PO PACK
17.0000 g | PACK | Freq: Every day | ORAL | Status: DC | PRN
  Filled 2014-02-25: qty 1

## 2014-02-25 MED ORDER — METOPROLOL TARTRATE 25 MG PO TABS
25.0000 mg | ORAL_TABLET | Freq: Two times a day (BID) | ORAL | Status: DC
  Administered 2014-02-25 – 2014-03-02 (×10): 25 mg via ORAL
  Filled 2014-02-25 (×12): qty 1

## 2014-02-25 MED ORDER — METOPROLOL TARTRATE 12.5 MG HALF TABLET
12.5000 mg | ORAL_TABLET | ORAL | Status: AC
  Administered 2014-02-25: 12.5 mg via ORAL
  Filled 2014-02-25: qty 1

## 2014-02-25 MED ORDER — OXYCODONE HCL ER 15 MG PO T12A
15.0000 mg | EXTENDED_RELEASE_TABLET | Freq: Two times a day (BID) | ORAL | Status: DC
  Administered 2014-02-25 – 2014-03-02 (×10): 15 mg via ORAL
  Filled 2014-02-25 (×10): qty 1

## 2014-02-25 NOTE — Progress Notes (Signed)
Pt pick up by [facility] for transport to [facility], Rm [id number]; pt alert and oriented; no complications noted

## 2014-02-25 NOTE — Progress Notes (Signed)
Patient received from [facility] via [facility], pt alert, oriented and stable.

## 2014-02-25 NOTE — Progress Notes (Signed)
Report called to RN receiving patient at [place]; all questions answered

## 2014-02-25 NOTE — Progress Notes (Addendum)
[facility] PROGRESS NOTE  [name] LXB:[id number] DOB: Mar 10, 1941 DOA: 02/23/2014 PCP: [name], MD  Interim summary:  Patient is a 73 year old white female with past medical history of diabetes, atrial fibrillation and mild chronic kidney disease who was seen approximately a month ago for abdominal pain and monitored overnight. She is noted to have a mild nonobstructing cystic lesion in the head of the pancreas. She actually had several days of gastroenteritis-like symptoms prior to admission at that time. Plan was for outpatient workup, although there were no immediate signs of cancer. At that time, patient's lab work was unremarkable.   Patient return back on 3/19 with complaints of severe back and neck pain that had been worsening over the past 2-3 weeks. In the emergency room she was noted to have a temperature of 102 M.D. in nature fibrillation with rapid ventricular rate. Her lactic acid level was elevated and she looked to be moderately dehydrated. She was complaining of abdominal and back pain and a CT abdomen/pelvis was done which was unremarkable. Patient was admitted to the hospitalist service for further evaluation. Patient is also noted on admission to have mild pancytopenia with a low white blood cell count, anemia and thrombocytopenia. She is also noted to have a markedly elevated alkaline phosphatase level of 800 on admission but normal GG TP consistent with a bone turnover process. By 3/20, patient was continued in severe colicky-like pain with severe pain in her neck more than anything else. Concerns were for discitis and she underwent MRI of the cervical and lumbar spine. In the interim, her pain was better controlled starting her on extended release OxyContin 10 mg every 12 hours plus when necessary for breakthrough.  Surprisingly, patient MRI noted no signs of acute bony infection, but did note Areas of geographic nonenhancement throughout the vertebral body  marrow and probably also affecting the ribs. This pattern can be seen in widespread bone infarction or in hematopoetic diseases, particularly T-cell lymphoma.  I spoke with Dr. Yancey Flemings oncology for recommendations for next steps. A skeletal survey was recommended. Beta-2 and urine/serum electrophoresis testing as well. He recommended transfer to [facility] long for additional evaluation by hematology/oncology. Depending on skeletal survey findings, a bone marrow biopsy would be recommended as well.  Note: Initially with unknown diagnosis and reported fever, although none documented in epic, patient was started on broad-spectrum IV antibiotics. Now that we have a diagnosis and to calcitonin level between 0.5 and 2, which can be seen elevated and other processes, has stopped antibiotics in light of no obvious sign of infection.  Assessment/Plan: Active Problems:   DIABETES MELLITUS, TYPE II: CBG stable, on sliding scale   HYPERLIPIDEMIA   HYPERTENSION: Stable now that pain is under control    CAD, non obstructive in '02   CKD (chronic kidney disease), stage III: With hydration, creatinine down to baseline at 1.12    Lesion of pancreas: Cystic lesion, appears stable from CT scan    Cirrhosis of liver without mention of alcohol   Sepsis   Atrial fibrillation with RVR: Now the pain is under control, rate is better. Have increased metoprolol, patient not on anticoagulation    Cervical spine pain: No focal findings, but patient has diffuse type of bone turnover involvement. Etiology? Check a skeletal survey, urine and serum protein electrophoresis . Still frequent use of when necessary pain medication, although extended release oxy ER has caused some pain relief. Increase extended release to 15 mg every 12 hours. Have told patient  that goal is using instant release less than every 4 hours    Alkaline phosphatase elevation: Secondary bone turnover and disease process.     Leukopenia: Whatever is causing her  bone marrow suppression, check skeletal survey followed by bone marrow biopsy    Thrombocytopenia   Anemia   Abnormal MRI  Code Status: Full code  Family Communication: Daughter at the bedside.  Disposition Plan: Transfer to [facility] for further evaluation by heme/   Consultants:  Oncology  Procedures:  Skeletal survey ordered  Antibiotics:  IV cefepime and vancomycin 3/19-3/21  HPI/Subjective: Patient's pain better controlled. Still having some episodes in the back and mostly in the neck. Requiring extended release OxyContin plus intermediate release every 4 hours  Objective: Filed Vitals:   02/25/14 1140  BP: 152/59  Pulse: 98  Temp: 98.5 F (36.9 C)  Resp: 20    Intake/Output Summary (Last 24 hours) at 02/25/14 1533 Last data filed at 02/25/14 1348  Gross per 24 hour  Intake   1730 ml  Output   1600 ml  Net    130 ml   Filed Weights   02/24/14 0215 02/24/14 0423 02/25/14 0406  Weight: 81.2 kg (179 lb 0.2 oz) 81.2 kg (179 lb 0.2 oz) 82.6 kg (182 lb 1.6 oz)    Exam:   General:  Alert and oriented x3, fatigue, no acute distress  Cardiovascular: Regular rate and rhythm, S1-S2  Respiratory: Clear to auscultation bilaterally  Abdomen: Soft, nontender, nondistended, positive bowel sounds  Musculoskeletal: No clubbing or cyanosis or edema   Data Reviewed: Basic Metabolic Panel:  Recent Labs Lab 02/22/14 0406 02/23/14 1746 02/24/14 0311 02/25/14 0230  NA 136* 133* 138 136*  K 5.3 4.7 5.3 4.4  CL 96 94* 102 101  CO2 $Re'21 19 22 21  'pUo$ GLUCOSE 205* 247* 192* 201*  BUN 23 25* 22 19  CREATININE 1.59* 1.79* 1.40* 1.12*  CALCIUM 9.0 8.8 8.5 8.6   Liver Function Tests:  Recent Labs Lab 02/22/14 0406 02/24/14 0040 02/24/14 0311 02/25/14 0230  AST $Re'17 17 21 28  'ONq$ ALT $R'7 6 6 7  'lX$ ALKPHOS 741* 790* 805* 1575*  BILITOT 0.5 0.5 0.6 0.4  PROT 7.4 6.4 6.5 6.3  ALBUMIN 3.5 2.8* 2.9* 2.7*    Recent Labs Lab 02/22/14 0540 02/24/14 0040  LIPASE 31 59    No results found for this basename: AMMONIA,  in the last 168 hours CBC:  Recent Labs Lab 02/22/14 0406 02/23/14 1746 02/24/14 0311 02/25/14 0230  WBC 3.3* 2.6* 1.8* 2.4*  NEUTROABS 2.0  --  1.1*  --   HGB 11.3* 11.0* 9.7* 9.4*  HCT 35.1* 33.7* 29.9* 29.0*  MCV 78.5 77.8* 77.3* 77.3*  PLT 91* 85* 59* 52*   Cardiac Enzymes:  Recent Labs Lab 02/24/14 0040 02/24/14 0311 02/24/14 0900 02/24/14 1400  TROPONINI <0.30 <0.30 <0.30 <0.30   BNP (last 3 results) No results found for this basename: PROBNP,  in the last 8760 hours CBG:  Recent Labs Lab 02/24/14 0844 02/24/14 1333 02/24/14 1631 02/25/14 0739 02/25/14 1320  GLUCAP 223* 145* 145* 155* 144*    Recent Results (from the past 240 hour(s))  CULTURE, BLOOD (ROUTINE X 2)     Status: None   Collection Time    02/23/14  6:03 PM      Result Value Ref Range Status   Specimen Description BLOOD ARM LEFT   Final   Special Requests BOTTLES DRAWN AEROBIC AND ANAEROBIC 5CC   Final  Culture  Setup Time     Final   Value: 02/24/2014 00:44     Performed at [facility]   Culture     Final   Value:        BLOOD CULTURE RECEIVED NO GROWTH TO DATE CULTURE WILL BE HELD FOR 5 DAYS BEFORE ISSUING A FINAL NEGATIVE REPORT     Performed at [facility]   Report Status PENDING   Incomplete  CULTURE, BLOOD (ROUTINE X 2)     Status: None   Collection Time    02/23/14  6:10 PM      Result Value Ref Range Status   Specimen Description BLOOD HAND LEFT   Final   Special Requests BOTTLES DRAWN AEROBIC AND ANAEROBIC 10CC   Final   Culture  Setup Time     Final   Value: 02/24/2014 00:45     Performed at [facility]   Culture     Final   Value:        BLOOD CULTURE RECEIVED NO GROWTH TO DATE CULTURE WILL BE HELD FOR 5 DAYS BEFORE ISSUING A FINAL NEGATIVE REPORT     Performed at [facility]   Report Status PENDING   Incomplete  URINE CULTURE     Status: None   Collection Time    02/23/14  7:40 PM       Result Value Ref Range Status   Specimen Description URINE, CLEAN CATCH   Final   Special Requests NONE   Final   Culture  Setup Time     Final   Value: 02/24/2014 00:50     Performed at [facility] Count     Final   Value: 75,000 COLONIES/ML     Performed at [facility]   Culture     Final   Value: Multiple bacterial morphotypes present, none predominant. Suggest appropriate recollection if clinically indicated.     Performed at [facility]   Report Status 02/24/2014 FINAL   Final  MRSA PCR SCREENING     Status: None   Collection Time    02/24/14  3:00 AM      Result Value Ref Range Status   MRSA by PCR NEGATIVE  NEGATIVE Final   Comment:            The GeneXpert MRSA Assay (FDA     approved for NASAL specimens     only), is one component of a     comprehensive MRSA colonization     surveillance program. It is not     intended to diagnose MRSA     infection nor to guide or     monitor treatment for     MRSA infections.     Studies: Ct Abdomen Pelvis Wo Contrast  02/23/2014   CLINICAL DATA:  Low back pain.  EXAM: CT ABDOMEN AND PELVIS WITHOUT CONTRAST  TECHNIQUE: Multidetector CT imaging of the abdomen and pelvis was performed following the standard protocol without intravenous contrast.  COMPARISON:  MRI 01/31/2014.  FINDINGS: No focal hepatic abnormality. Slight hepatic irregularity noted. Cirrhosis could present this fashion. Mild splenomegaly. Splenosis. No biliary distention. Cystic changes in the head of pancreas for better imaged with MRI, represents made to prior MRI report. Calcific densities noted in the pancreas could be related to vascular disease and/or chronic pancreatic inflammatory change. Gallbladder is nondistended. No biliary distention.  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 The otherwise unremarkable. No focal renal abnormalities identified. Renal vascular calcification  present. No hydronephrosis or obstructing ureteral stone. Bladder is nondistended.  Hysterectomy. Tiny 1.5 cm left ovarian cyst noted. No free pelvic fluid.  No significant adenopathy. Abdominal aorta is atherosclerotic. No aneurysm. Distal vessels are atherosclerotic.  Appendix normal. No inflammatory changes are present in the right lower quadrant. Terminal ileum appears normal. No inflammatory changes left lower quadrant. No significant bowel distention. No free air. No mesenteric mass lesions noted. Small nodular opacities noted in the region of the gastrohepatic ligament may represent perigastric varices  Lung bases are clear. Coronary artery disease. Abdominal wall is intact. No significant hernia. Lipoma noted along the distal ileal psoas muscle on the right. No acute bony abnormality. Degenerative changes lumbar spine. Degenerative changes both hips.  IMPRESSION: 1. Slight hepatic irregularity with splenomegaly. These findings suggest the possibility of cirrhosis. Small perigastric varices cannot be excluded.  2. Cystic changes previously noted in the pancreas on prior MRI better demonstrated by MRI and continued follow-up as recommended on prior MRI report.  3. Calcific density pancreas may be related to vascular disease however chronic pancreatitis cannot be excluded.  4.  Small left ovarian cyst.  5.  Coronary artery disease.  .   Electronically Signed   By: Cleona   On: 02/23/2014 23:30   Mr Lumbar Spine Wo Contrast  02/23/2014   CLINICAL DATA:  Back pain, concern for osteomyelitis/discitis.  EXAM: MRI LUMBAR SPINE WITHOUT CONTRAST  TECHNIQUE: Multiplanar, multisequence MR imaging was performed. No intravenous contrast was administered.  COMPARISON:  Prior MRI from 01/31/2014 and CT from 01/29/2014.  FINDINGS: For the purposes of this dictation, the lowest well-formed intervertebral disc spaces presumed to be the L5-S1 level, and there presumed to be 5 lumbar type vertebral bodies.  Mild  straightening of the normal lumbar lordosis. There is no listhesis. Vertebral body heights are preserved. Degenerative endplate Schmorl's node present at the superior endplate of R42.  Mild diffuse congenital shortening of the pedicles noted.  Signal intensity within the vertebral body bone marrow is normal. Signal intensity within the spinal cord is within normal limits as well. Conus medullaris terminates at the L1 level. No abnormal STIR signal seen within the intervertebral discs or vertebral bodies to suggest osteomyelitis/ discitis. No epidural fluid collection. Mildly prominent epidural fat noted within the lower lumbar spine.  At T11-12, seen only on sagittal projection. There is degenerative disc bulging with bilateral facet arthrosis. The bulging disc appears to abut the ventral spinal cord with resultant moderate to severe central canal stenosis. No definite cord signal changes identified. Canal stenosis (series 5, image 6). Mild right foraminal narrowing is present this level as well.  At T12-L1, prominent degenerative endplate spurring seen anteriorly. There is mild disc desiccation without significant disc bulge. No focal disc protrusion or facet arthrosis. No canal or neural foraminal stenosis.  At L1-2, there is degenerative disc desiccation with mild diffuse disc bulge. Mild bilateral facet hypertrophy. No canal or neural foraminal stenosis.  At L2-3, there is degenerative disc desiccation with associated degenerative endplate changes anteriorly. Mild anterior osteophytic spurring. There is mild disc bulge without focal disc protrusion. Mild bilateral facet hypertrophy and ligamentum flavum thickening. No canal or neural foraminal stenosis.  At L3-4, there is disc desiccation with mild diffuse disc bulge mild bilateral facet arthrosis. There is resultant moderate canal narrowing, largely due to short pedicles. There is no focal disc protrusion, although the bulging disc  appears to abut the exiting  left L3 nerve root as it courses out of the neural foramen (series 8, image 16). Neural foramina remain widely patent.  At L4-5, there is degenerative disc desiccation with mild-to-moderate bilateral facet hypertrophy and ligamentum flavum thickening. Moderate central canal stenosis present, largely due to short pedicles. There is mild left foraminal narrowing without significant right foraminal stenosis. No focal disc herniation at this level.  At L5-S1, there is degenerative disc desiccation with diffuse disc bulging and mild bilateral facet hypertrophy. A superimposed left foraminal disc protrusion is present, abutting the left L5 nerve root as it courses through the left neural foramen (series 8, image 28). There is moderate left foraminal stenosis. No significant canal or right neural foraminal narrowing. Prominent epidural lipomatosis noted at this level.  Visualized paraspinous musculature is within normal limits. Visualized visceral structures are unremarkable. No retroperitoneal adenopathy.  IMPRESSION: 1. No MRI evidence of osteomyelitis/discitis or other infectious process within the lower back. 2. Right central disc bulge/protrusion at T11-12 with resultant moderate to severe central canal stenosis. No definite cord signal changes identified. 3. Left foraminal disc protrusion at L5-S1 impinging upon the left L5 nerve root and resulting and moderate left neural foraminal stenosis. 4. Mild degenerative disc bulging and facet arthrosis at L3-4 and L4-5, which superimposed a short pedicles results in moderate canal stenosis. At the L3-4 level, the bulging disc appears to abut the exiting left L3 nerve root as it courses out of the left neural foramen.   Electronically Signed   By: Jeannine Boga M.D.   On: 02/23/2014 22:51   Mr Cervical Spine W Wo Contrast  02/24/2014   CLINICAL DATA:  Severe back pain.  Fever.  EXAM: MRI CERVICAL AND thoracic SPINE WITHOUT AND WITH CONTRAST  TECHNIQUE: Multiplanar  and multiecho pulse sequences of the cervical spine, to include the craniocervical junction and cervicothoracic junction, and thoracic spine, were obtained without and with intravenous contrast.  CONTRAST:  36mL MULTIHANCE GADOBENATE DIMEGLUMINE 529 MG/ML IV SOLN  COMPARISON:  Lumbar study done yesterday.  Chest CT 02/04/2014.  FINDINGS: MRI CERVICAL SPINE FINDINGS  Alignment of the spine is normal. There are mild bulging discs at C3-4, C4-5, C5-6 and C6-7. These narrow the ventral subarachnoid space but do not compress the cord. No cord lesion. Enhancement pattern of the vertebral bodies shows geographic areas of nonenhancement throughout the marrow spaces. This is an unusual pattern that can occur with either bone infarction or can be seen an other hematopoetic malignancies particularly T-cell lymphoma. I do not see enhancement pattern that would allow diagnosis of osteomyelitis or any septic joint.  MRI thoracic SPINE FINDINGS  Alignment is normal. There are disc bulges in the mid and lower thoracic region but no herniation or compression of the neural structures. No abnormal cord signal. There is a small amount of layering pleural fluid bilaterally. Again demonstrated throughout the thoracic region are areas of geographic nonenhancement within the bones. This pattern can be seen with widespread bone infarction or hematopoetic states particularly T-cell lymphoma. There is no sign of focal enhancement to suggest osteomyelitis or septic joint. I think this marrow pattern also may be affecting the ribs, an this could be a systemic osseous condition.  IMPRESSION: There is no finding to allow diagnosis of focal bone or joint infection. No fracture. No significant disc pathology.  Areas of geographic nonenhancement throughout the vertebral body marrow and probably also affecting the ribs. This pattern can be seen in widespread bone infarction  or in hematopoetic diseases, particularly T-cell lymphoma.   Electronically  Signed   By: Paulina Fusi M.D.   On: 02/24/2014 20:05   Mr Thoracic Spine W Wo Contrast  02/24/2014   CLINICAL DATA:  Severe back pain.  Fever.  EXAM: MRI CERVICAL AND thoracic SPINE WITHOUT AND WITH CONTRAST  TECHNIQUE: Multiplanar and multiecho pulse sequences of the cervical spine, to include the craniocervical junction and cervicothoracic junction, and thoracic spine, were obtained without and with intravenous contrast.  CONTRAST:  31mL MULTIHANCE GADOBENATE DIMEGLUMINE 529 MG/ML IV SOLN  COMPARISON:  Lumbar study done yesterday.  Chest CT 02/04/2014.  FINDINGS: MRI CERVICAL SPINE FINDINGS  Alignment of the spine is normal. There are mild bulging discs at C3-4, C4-5, C5-6 and C6-7. These narrow the ventral subarachnoid space but do not compress the cord. No cord lesion. Enhancement pattern of the vertebral bodies shows geographic areas of nonenhancement throughout the marrow spaces. This is an unusual pattern that can occur with either bone infarction or can be seen an other hematopoetic malignancies particularly T-cell lymphoma. I do not see enhancement pattern that would allow diagnosis of osteomyelitis or any septic joint.  MRI thoracic SPINE FINDINGS  Alignment is normal. There are disc bulges in the mid and lower thoracic region but no herniation or compression of the neural structures. No abnormal cord signal. There is a small amount of layering pleural fluid bilaterally. Again demonstrated throughout the thoracic region are areas of geographic nonenhancement within the bones. This pattern can be seen with widespread bone infarction or hematopoetic states particularly T-cell lymphoma. There is no sign of focal enhancement to suggest osteomyelitis or septic joint. I think this marrow pattern also may be affecting the ribs, an this could be a systemic osseous condition.  IMPRESSION: There is no finding to allow diagnosis of focal bone or joint infection. No fracture. No significant disc pathology.  Areas  of geographic nonenhancement throughout the vertebral body marrow and probably also affecting the ribs. This pattern can be seen in widespread bone infarction or in hematopoetic diseases, particularly T-cell lymphoma.   Electronically Signed   By: Paulina Fusi M.D.   On: 02/24/2014 20:05   US Abdomen Complete  02/24/2014   CLINICAL DATA:  Abdominal pain.  EXAM: ULTRASOUND ABDOMEN COMPLETE  COMPARISON:  CT abdomen and pelvis 02/23/2014.  FINDINGS: Gallbladder:  No gallstones or wall thickening visualized. No sonographic Murphy sign noted.  Common bile duct:  Diameter: 0.4 cm  Liver:  Nodularity of the liver border is better demonstrated on CT scan. No focal liver lesion or intrahepatic biliary ductal dilatation.  IVC:  No abnormality visualized.  Pancreas:  Visualized portion unremarkable.  Spleen:  Size and appearance within normal limits.  Right Kidney:  Length: 11.3 cm. Echogenicity within normal limits. No mass or hydronephrosis visualized.  Left Kidney:  Length: 11.6 cm. Echogenicity within normal limits. No mass or hydronephrosis visualized.  Abdominal aorta:  No aneurysm visualized.  Other findings:  None.  IMPRESSION: Negative for gallstones.  No acute finding.  Nodularity of the liver border consistent with cirrhosis is better demonstrated on comparison CT scan.   Electronically Signed   By: Drusilla Kanner M.D.   On: 02/24/2014 03:45   Dg Chest Port 1 View  02/23/2014   CLINICAL DATA:  Fever.  EXAM: PORTABLE CHEST - 1 VIEW  COMPARISON:  Chest x-ray 01/29/2014 .  FINDINGS: Mediastinum and hilar structures are normal. Heart size upper limits normal, no pulmonary venous congestion. Minimal left  base infiltrate cannot be excluded. No pleural effusion or pneumothorax. No acute bony abnormality. Degenerative changes thoracic spine and both shoulders.  IMPRESSION: Cannot exclude a minimal infiltrate in the left lung base.   Electronically Signed   By: Marcello Moores  Register   On: 02/23/2014 18:45    Scheduled  Meds: . amLODipine  5 mg Oral Daily  . aspirin  325 mg Oral Daily  . ceFEPime (MAXIPIME) IV  1 g Intravenous QHS  . feeding supplement (RESOURCE BREEZE)  1 Container Oral TID BM  . insulin aspart  0-9 Units Subcutaneous TID WC  . metoprolol tartrate  12.5 mg Oral NOW  . metoprolol tartrate  25 mg Oral BID  . OxyCODONE  15 mg Oral Q12H  . pantoprazole  40 mg Oral Daily  . simvastatin  20 mg Oral QPM  . sodium chloride  3 mL Intravenous Q12H  . vancomycin  1,000 mg Intravenous Q24H   Continuous Infusions:   Active Problems:   DIABETES MELLITUS, TYPE II   HYPERLIPIDEMIA   HYPERTENSION   CAD, non obstructive in '02   CKD (chronic kidney disease), stage III   Lesion of pancreas   Cirrhosis of liver without mention of alcohol   Sepsis   Atrial fibrillation with RVR   Cervical spine pain   Alkaline phosphatase elevation   Leukopenia   Thrombocytopenia   Anemia   Abnormal MRI    Time spent: 35 minutes    [facility] Pager [phone]. If 7PM-7AM, please contact night-coverage at [email or web address], password [email or web address] 02/25/2014, 3:33 PM  LOS: 2 days

## 2014-02-26 ENCOUNTER — Inpatient Hospital Stay (HOSPITAL_COMMUNITY): Payer: Medicare HMO

## 2014-02-26 DIAGNOSIS — D649 Anemia, unspecified: Secondary | ICD-10-CM

## 2014-02-26 LAB — GLUCOSE, CAPILLARY
GLUCOSE-CAPILLARY: 166 mg/dL — AB (ref 70–99)
Glucose-Capillary: 161 mg/dL — ABNORMAL HIGH (ref 70–99)
Glucose-Capillary: 166 mg/dL — ABNORMAL HIGH (ref 70–99)
Glucose-Capillary: 255 mg/dL — ABNORMAL HIGH (ref 70–99)

## 2014-02-26 MED ORDER — HYDROMORPHONE HCL PF 2 MG/ML IJ SOLN
2.0000 mg | INTRAMUSCULAR | Status: DC | PRN
  Administered 2014-02-26 – 2014-02-27 (×9): 2 mg via INTRAVENOUS
  Administered 2014-02-27: 1 mg via INTRAVENOUS
  Administered 2014-02-27 – 2014-02-28 (×6): 2 mg via INTRAVENOUS
  Filled 2014-02-26 (×17): qty 1

## 2014-02-26 MED ORDER — HYDROMORPHONE HCL PF 1 MG/ML IJ SOLN
INTRAMUSCULAR | Status: AC
  Filled 2014-02-26: qty 1

## 2014-02-26 MED ORDER — OXYCODONE HCL 5 MG PO TABS
5.0000 mg | ORAL_TABLET | ORAL | Status: DC | PRN
  Administered 2014-02-26: 5 mg via ORAL
  Administered 2014-02-27 – 2014-03-02 (×7): 10 mg via ORAL
  Administered 2014-03-02: 5 mg via ORAL
  Filled 2014-02-26: qty 1
  Filled 2014-02-26: qty 2
  Filled 2014-02-26: qty 1
  Filled 2014-02-26 (×7): qty 2

## 2014-02-26 MED ORDER — HYDROMORPHONE HCL PF 1 MG/ML IJ SOLN
1.0000 mg | INTRAMUSCULAR | Status: DC | PRN
  Administered 2014-02-26: 1 mg via INTRAVENOUS

## 2014-02-26 NOTE — Progress Notes (Addendum)
[facility] PROGRESS NOTE  [name] GEX:[id number] DOB: 1941-07-21 DOA: 02/23/2014 PCP: [name], MD  Brief narrative: 73 year old female with past medical history of diabetes, atrial fibrillation and mild chronic kidney disease who presented to [facility] ED 02/23/2014 with complaints of neck, back and abdominal pain worsening over past 2-3 weeks prior to this admission. On admission she had a fever of 102, elevated lactic acid and looked clinically dehydrated. Her CT abdomen revealed cystic changes in pancreas similar to prior study and slight hepatic irregularity suggestive of possible cirrhosis. Additional evaluation revealed marked elevation in ALP thought to be from bone resorption likely from malignant process. MRI cervical and thoracic spine revealed possible widespread bone infarction. Per oncology, skeletal survey was recommended for further evaluation and likely bone marrow biopsy depending on results of skeletal survey.  Assessment/Plan:   Principal Problem: Bone pain, back and neck pain in the setting of markedly elevated ALP - findings on MRI thoracic and cervical spine showed possible widespread bone infarction/ T cell lymphoma - skeletal survey is pending. Will call oncology in am for an official consult  - follow up UPEP and SPEP - pain management with dilaudid 2 mg every 2 hours IV PRN severe pain; oxycodone 15 mg PO every 12 hours   Active Problems:  DIABETES MELLITUS, TYPE II - CBG's in past 24 hours: 161, 166, 255 - continue sliding scale insulin  HYPERLIPIDEMIA  - continue statin therapy HYPERTENSION - continue Norvasc and metoprolol CAD, non obstructive in '02  - continue aspirin  CKD (chronic kidney disease), stage III - With hydration, creatinine down to baseline at 1.12  H/O atrial fibrillation - rate controlled with metoprolol - not on anticoagulation; she is on aspirin daily Alkaline phosphatase elevation - Secondary bone turnover and  disease process Pancytopenia - possible malignancy - will follow up on skeletal survey results  Code Status: full code Family Communication: no family at the bedside  Disposition Plan: home when stable  Leisa Lenz, MD  [facility] Pager [phone]  If 7PM-7AM, please contact night-coverage [email or web address] Password [email or web address] 02/26/2014, 2:15 PM   LOS: 3 days   Consultants:  None   Procedures:  None   Antibiotics:  None   HPI/Subjective: In lot of pain this am mostly in the lower back.  Objective: Filed Vitals:   02/25/14 2300 02/25/14 2309 02/26/14 0510 02/26/14 1322  BP:  140/76 142/66 152/68  Pulse:  80 105 96  Temp: 99.2 F (37.3 C)  98.4 F (36.9 C) 97.7 F (36.5 C)  TempSrc: Oral  Oral Oral  Resp:  _0 Height:      Weight:      SpO2:  97% 97% 97%    Intake/Output Summary (Last 24 hours) at 02/26/14 1415 Last data filed at 02/26/14 1300  Gross per 24 hour  Intake    960 ml  Output    450 ml  Net    510 ml    Exam:   General:  Pt is alert, in mild distress due to pain  Cardiovascular: Regular rate and rhythm, S1/S2, no murmurs, no rubs, no gallops  Respiratory: Clear to auscultation bilaterally, no wheezing, no crackles, no rhonchi  Abdomen: Soft, non tender, non distended, bowel sounds present, no guarding  Extremities: No edema, pulses DP and PT palpable bilaterally  Neuro: Grossly nonfocal  Data Reviewed: Basic Metabolic Panel:  Recent Labs Lab 02/22/14 0406 02/23/14 1746 02/24/14 0311 02/25/14 0230  NA 136* 133* 138 136*  K  5.3 4.7 5.3 4.4  CL 96 94* 102 101  CO2 _0 GLUCOSE 205* 247* 192* 201*  BUN 23 25* 22 19  CREATININE 1.59* 1.79* 1.40* 1.12*  CALCIUM 9.0 8.8 8.5 8.6   Liver Function Tests:  Recent Labs Lab 02/22/14 0406 02/24/14 0040 02/24/14 0311 02/25/14 0230  AST _1 ALT _2 ALKPHOS 741* 790* 805* 1575*  BILITOT 0.5 0.5 0.6 0.4  PROT 7.4 6.4 6.5 6.3  ALBUMIN 3.5 2.8*  2.9* 2.7*    Recent Labs Lab 02/22/14 0540 02/24/14 0040  LIPASE 31 59   No results found for this basename: AMMONIA,  in the last 168 hours CBC:  Recent Labs Lab 02/22/14 0406 02/23/14 1746 02/24/14 0311 02/25/14 0230  WBC 3.3* 2.6* 1.8* 2.4*  NEUTROABS 2.0  --  1.1*  --   HGB 11.3* 11.0* 9.7* 9.4*  HCT 35.1* 33.7* 29.9* 29.0*  MCV 78.5 77.8* 77.3* 77.3*  PLT 91* 85* 59* 52*   Cardiac Enzymes:  Recent Labs Lab 02/24/14 0040 02/24/14 0311 02/24/14 0900 02/24/14 1400  TROPONINI <0.30 <0.30 <0.30 <0.30   BNP: No components found with this basename: POCBNP,  CBG:  Recent Labs Lab 02/25/14 1612 02/25/14 1852 02/25/14 2139 02/26/14 0751 02/26/14 1151  GLUCAP 249* 241* 246* 166* 161*    CULTURE, BLOOD (ROUTINE X 2)     Status: None   Collection Time    02/23/14  6:03 PM      Result Value Ref Range Status   Specimen Description BLOOD ARM LEFT   Final   Value:        BLOOD CULTURE RECEIVED NO GROWTH TO DATE CULTURE WILL BE HELD FOR 5 DAYS BEFORE ISSUING A FINAL NEGATIVE REPORT     Performed at [facility]   Report Status PENDING   Incomplete  CULTURE, BLOOD (ROUTINE X 2)     Status: None   Collection Time    02/23/14  6:10 PM      Result Value Ref Range Status   Specimen Description BLOOD HAND LEFT   Final   Value:        BLOOD CULTURE RECEIVED NO GROWTH TO DATE CULTURE WILL BE HELD FOR 5 DAYS BEFORE ISSUING A FINAL NEGATIVE REPORT     Performed at [facility]   Report Status PENDING   Incomplete  URINE CULTURE     Status: None   Collection Time    02/23/14  7:40 PM      Result Value Ref Range Status   Specimen Description URINE, CLEAN CATCH   Final   Value: Multiple bacterial morphotypes present, none predominant. Suggest appropriate recollection if clinically indicated.     Performed at [facility]   Report Status 02/24/2014 FINAL   Final  MRSA PCR SCREENING     Status: None   Collection Time    02/24/14  3:00 AM       Result Value Ref Range Status   MRSA by PCR NEGATIVE  NEGATIVE Final     Studies: Mr Cervical Spine W Wo Contrast 02/24/2014    IMPRESSION: There is no finding to allow diagnosis of focal bone or joint infection. No fracture. No significant disc pathology.  Areas of geographic nonenhancement throughout the vertebral body marrow and probably also affecting the ribs. This pattern can be seen in widespread bone infarction or in hematopoetic diseases, particularly T-cell lymphoma.     Mr Thoracic  Spine W Wo Contrast 02/24/2014    IMPRESSION: There is no finding to allow diagnosis of focal bone or joint infection. No fracture. No significant disc pathology.  Areas of geographic nonenhancement throughout the vertebral body marrow and probably also affecting the ribs. This pattern can be seen in widespread bone infarction or in hematopoetic diseases, particularly T-cell lymphoma.      Scheduled Meds: . amLODipine  5 mg Oral Daily  . aspirin  325 mg Oral Daily  . feeding supplement  1 Container Oral TID BM  . insulin aspart  0-9 Units Subcutaneous TID WC  . metoprolol tartrate  25 mg Oral BID  . OxyCODONE  15 mg Oral Q12H  . pantoprazole  40 mg Oral Daily  . simvastatin  20 mg Oral QPM

## 2014-02-27 DIAGNOSIS — C9030 Solitary plasmacytoma not having achieved remission: Secondary | ICD-10-CM

## 2014-02-27 DIAGNOSIS — M545 Low back pain, unspecified: Secondary | ICD-10-CM

## 2014-02-27 DIAGNOSIS — K7460 Unspecified cirrhosis of liver: Secondary | ICD-10-CM

## 2014-02-27 DIAGNOSIS — R109 Unspecified abdominal pain: Secondary | ICD-10-CM

## 2014-02-27 DIAGNOSIS — D61818 Other pancytopenia: Secondary | ICD-10-CM

## 2014-02-27 DIAGNOSIS — C801 Malignant (primary) neoplasm, unspecified: Secondary | ICD-10-CM

## 2014-02-27 DIAGNOSIS — R509 Fever, unspecified: Secondary | ICD-10-CM

## 2014-02-27 DIAGNOSIS — R1013 Epigastric pain: Secondary | ICD-10-CM

## 2014-02-27 LAB — GLUCOSE, CAPILLARY
GLUCOSE-CAPILLARY: 215 mg/dL — AB (ref 70–99)
GLUCOSE-CAPILLARY: 186 mg/dL — AB (ref 70–99)
Glucose-Capillary: 228 mg/dL — ABNORMAL HIGH (ref 70–99)
Glucose-Capillary: 222 mg/dL — ABNORMAL HIGH (ref 70–99)

## 2014-02-27 LAB — BETA-2-GLYCOPROTEIN I ABS, IGG/M/A
Beta-2 Glyco I IgG: 6 G Units (ref <20)
Beta-2-Glycoprotein I IgA: 19 A Units (ref <20)
Beta-2-Glycoprotein I IgM: 45 M Units — ABNORMAL HIGH (ref <20)

## 2014-02-27 LAB — CBC WITH DIFFERENTIAL/PLATELET
BASOS ABS: 0.1 10*3/uL (ref 0.0–0.1)
Basophils Relative: 2 % — ABNORMAL HIGH (ref 0–1)
Eosinophils Absolute: 0 10*3/uL (ref 0.0–0.7)
Eosinophils Relative: 1 % (ref 0–5)
HEMATOCRIT: 26.7 % — AB (ref 36.0–46.0)
Hemoglobin: 8.8 g/dL — ABNORMAL LOW (ref 12.0–15.0)
LYMPHS ABS: 1.2 10*3/uL (ref 0.7–4.0)
LYMPHS PCT: 33 % (ref 12–46)
MCH: 25.1 pg — ABNORMAL LOW (ref 26.0–34.0)
MCHC: 33 g/dL (ref 30.0–36.0)
MCV: 76.1 fL — ABNORMAL LOW (ref 78.0–100.0)
MONOS PCT: 7 % (ref 3–12)
Monocytes Absolute: 0.3 10*3/uL (ref 0.1–1.0)
Neutro Abs: 2 10*3/uL (ref 1.7–7.7)
Neutrophils Relative %: 57 % (ref 43–77)
PLATELETS: 39 10*3/uL — AB (ref 150–400)
RBC: 3.51 MIL/uL — ABNORMAL LOW (ref 3.87–5.11)
RDW: 16.7 % — AB (ref 11.5–15.5)
WBC: 3.6 10*3/uL — AB (ref 4.0–10.5)

## 2014-02-27 LAB — BASIC METABOLIC PANEL
BUN: 41 mg/dL — ABNORMAL HIGH (ref 6–23)
CALCIUM: 8.6 mg/dL (ref 8.4–10.5)
CHLORIDE: 94 meq/L — AB (ref 96–112)
CO2: 19 mEq/L (ref 19–32)
Creatinine, Ser: 1.72 mg/dL — ABNORMAL HIGH (ref 0.50–1.10)
GFR calc non Af Amer: 29 mL/min — ABNORMAL LOW (ref >90)
GFR, EST AFRICAN AMERICAN: 33 mL/min — AB (ref >90)
Glucose, Bld: 299 mg/dL — ABNORMAL HIGH (ref 70–99)
Potassium: 5 mEq/L (ref 3.7–5.3)
Sodium: 129 mEq/L — ABNORMAL LOW (ref 137–147)

## 2014-02-27 LAB — HEMOGLOBIN A1C
Hgb A1c MFr Bld: 8.5 % — ABNORMAL HIGH (ref <5.7)
Mean Plasma Glucose: 197 mg/dL — ABNORMAL HIGH (ref <117)

## 2014-02-27 MED ORDER — INSULIN GLARGINE 100 UNIT/ML ~~LOC~~ SOLN
10.0000 [IU] | Freq: Every day | SUBCUTANEOUS | Status: DC
  Administered 2014-02-27 – 2014-03-02 (×4): 10 [IU] via SUBCUTANEOUS
  Filled 2014-02-27 (×5): qty 0.1

## 2014-02-27 NOTE — Progress Notes (Addendum)
[facility] PROGRESS NOTE  CATHIE BONNELL UQJ:[id number] DOB: 1941-01-12 DOA: 02/23/2014 PCP: [name], MD  Brief narrative: 73 year old female with past medical history of diabetes, atrial fibrillation and mild chronic kidney disease who presented to [facility] ED 02/23/2014 with complaints of neck, back and abdominal pain worsening over past 2-3 weeks prior to this admission. On admission she had a fever of 102, elevated lactic acid and looked clinically dehydrated. Her CT abdomen revealed cystic changes in pancreas similar to prior study and slight hepatic irregularity suggestive of possible cirrhosis. Additional evaluation revealed marked elevation in ALP thought to be from bone resorption likely from malignant process. MRI cervical and thoracic spine revealed possible widespread bone infarction. Per oncology, skeletal survey was recommended for further evaluation but skeletal survey did not show focal osseous abnormalities to correspond to the findings on MRI.  Assessment/Plan:   Principal Problem:  Bone pain, back and cervical spine pain in the setting of markedly elevated ALP  Patient referred from [place] to [facility] 3/21 for further evaluation of possible widespread bone infarction/T-cell lymphoma. These findings were seen on MRI thoracic and cervical spine. Patient had skeletal survey done as well but there were no focal osseous abnormalities to correspond to the findings on MRI. I asked oncology to give [place] an input on further evaluation. Not sure how to explain high ALP and pancytopenia. GGT is WNL. So far UPEP, SPEP and IFE are still pending.  Pain management with dilaudid 2 mg every 2 hours IV PRN severe pain; oxycodone 15 mg PO every 12 hours  Active Problems:  Liver cirrhosis, NASH Has been seen by GI in the office on 02/09/2014 and patient was thought to have liver cirrhosis secondary to NASH. Patient had an extensive serological evaluation during previous hospital stay and the  evaluation included ANA, AMA, anti-smooth muscle antibody, hepatitis B surface antigen and antibody, and hepatitis C antibody were all negative/normal. Patient denies ETOH use. CA 19.9 was 9.7. GI consulted today.  DIABETES MELLITUS, TYPE II  CBG's in past 24 hours: 255, 228, 215. Diabetic coordinator recommended 10 units of Lantus daily. We will continue sliding scale insulin as well. A1c is pending. HYPERLIPIDEMIA  Continue statin therapy  HYPERTENSION  Continue Norvasc and metoprolol  CAD, non obstructive in '02  Continue aspirin  CKD (chronic kidney disease), stage III  With hydration, creatinine down to baseline at 1.12  H/O atrial fibrillation  Rate controlled with metoprolol. Not on anticoagulation; she is on aspirin daily  Pancytopenia  Unclear etiology. We will follow up on UPEP, SPEP, IFE. Appreciate oncology assistance. Patient's last colonoscopy was on 5/23/112 At which time, polyps which were removed from the descending colon as well as AV malformations in the cecum and descending colon, and small internal hemorrhoids. One of the polyps was a tubular adenoma and the other was a serrated adenoma with no evidence of high-grade dysplasia.  Code Status: full code  Family Communication: no family at the bedside  Disposition Plan: home when stable   Consultants:  Oncology Gastroenterology  Procedures:  None  Antibiotics:  None    Leisa Lenz, MD  [facility] Pager [phone]  If 7PM-7AM, please contact night-coverage [email or web address] Password [email or web address] 02/27/2014, 1:56 PM   LOS: 4 days    HPI/Subjective: Pain in the neck and back this am, 6/10 in intensity.   Objective: Filed Vitals:   02/26/14 1322 02/26/14 2120 02/27/14 0522 02/27/14 1159  BP: 152/68 147/75 139/62 110/68  Pulse: 96 64 108  Temp: 97.7 F (36.5 C) 98.7 F (37.1 C) 98.4 F (36.9 C)   TempSrc: Oral Oral Oral   Resp: _0 Height:      Weight:   82.2 kg (181 lb 3.5 oz)   SpO2: 97% 94%  95%     Intake/Output Summary (Last 24 hours) at 02/27/14 1356 Last data filed at 02/27/14 1129  Gross per 24 hour  Intake      0 ml  Output    500 ml  Net   -500 ml    Exam:   General:  Pt is alert, follows commands appropriately, not in acute distress  Cardiovascular: Regular rate and rhythm, S1/S2 appreciated   Respiratory: Clear to auscultation bilaterally, no wheezing, no crackles  Abdomen: non tender, non distended, bowel sounds present, no guarding  Extremities: No edema, pulses DP and PT palpable bilaterally  Neuro: Grossly nonfocal  Data Reviewed: Basic Metabolic Panel:  Recent Labs Lab 02/22/14 0406 02/23/14 1746 02/24/14 0311 02/25/14 0230 02/27/14 1015  NA 136* 133* 138 136* 129*  K 5.3 4.7 5.3 4.4 5.0  CL 96 94* 102 101 94*  CO2 _1 GLUCOSE 205* 247* 192* 201* 299*  BUN 23 25* 22 19 41*  CREATININE 1.59* 1.79* 1.40* 1.12* 1.72*  CALCIUM 9.0 8.8 8.5 8.6 8.6   Liver Function Tests:  Recent Labs Lab 02/22/14 0406 02/24/14 0040 02/24/14 0311 02/25/14 0230  AST _2 ALT _3 ALKPHOS 741* 790* 805* 1575*  BILITOT 0.5 0.5 0.6 0.4  PROT 7.4 6.4 6.5 6.3  ALBUMIN 3.5 2.8* 2.9* 2.7*    Recent Labs Lab 02/22/14 0540 02/24/14 0040  LIPASE 31 59   No results found for this basename: AMMONIA,  in the last 168 hours CBC:  Recent Labs Lab 02/22/14 0406 02/23/14 1746 02/24/14 0311 02/25/14 0230 02/27/14 1015  WBC 3.3* 2.6* 1.8* 2.4* 3.6*  NEUTROABS 2.0  --  1.1*  --  2.0  HGB 11.3* 11.0* 9.7* 9.4* 8.8*  HCT 35.1* 33.7* 29.9* 29.0* 26.7*  MCV 78.5 77.8* 77.3* 77.3* 76.1*  PLT 91* 85* 59* 52* 39*   Cardiac Enzymes:  Recent Labs Lab 02/24/14 0040 02/24/14 0311 02/24/14 0900 02/24/14 1400  TROPONINI <0.30 <0.30 <0.30 <0.30   BNP: No components found with this basename: POCBNP,  CBG:  Recent Labs Lab 02/26/14 1151 02/26/14 1720 02/26/14 2119 02/27/14 0803 02/27/14 1204  GLUCAP 161* 166* 255* 228*  215*    Recent Results (from the past 240 hour(s))  CULTURE, BLOOD (ROUTINE X 2)     Status: None   Collection Time    02/23/14  6:03 PM      Result Value Ref Range Status   Specimen Description BLOOD ARM LEFT   Final   Special Requests BOTTLES DRAWN AEROBIC AND ANAEROBIC 5CC   Final   Culture  Setup Time     Final   Value: 02/24/2014 00:44     Performed at [facility]   Culture     Final   Value:        BLOOD CULTURE RECEIVED NO GROWTH TO DATE CULTURE WILL BE HELD FOR 5 DAYS BEFORE ISSUING A FINAL NEGATIVE REPORT     Performed at [facility]   Report Status PENDING   Incomplete  CULTURE, BLOOD (ROUTINE X 2)     Status: None   Collection Time    02/23/14  6:10 PM  Result Value Ref Range Status   Specimen Description BLOOD HAND LEFT   Final   Special Requests BOTTLES DRAWN AEROBIC AND ANAEROBIC 10CC   Final   Culture  Setup Time     Final   Value: 02/24/2014 00:45     Performed at [facility]   Culture     Final   Value:        BLOOD CULTURE RECEIVED NO GROWTH TO DATE CULTURE WILL BE HELD FOR 5 DAYS BEFORE ISSUING A FINAL NEGATIVE REPORT     Performed at [facility]   Report Status PENDING   Incomplete  URINE CULTURE     Status: None   Collection Time    02/23/14  7:40 PM      Result Value Ref Range Status   Specimen Description URINE, CLEAN CATCH   Final   Special Requests NONE   Final   Culture  Setup Time     Final   Value: 02/24/2014 00:50     Performed at [place]     Final   Value: 75,000 COLONIES/ML     Performed at [facility]   Culture     Final   Value: Multiple bacterial morphotypes present, none predominant. Suggest appropriate recollection if clinically indicated.     Performed at [facility]   Report Status 02/24/2014 FINAL   Final  MRSA PCR SCREENING     Status: None   Collection Time    02/24/14  3:00 AM      Result Value Ref Range Status   MRSA by PCR NEGATIVE   NEGATIVE Final   Comment:            The GeneXpert MRSA Assay (FDA     approved for NASAL specimens     only), is one component of a     comprehensive MRSA colonization     surveillance program. It is not     intended to diagnose MRSA     infection nor to guide or     monitor treatment for     MRSA infections.     Studies: Dg Bone Survey Met 02/27/2014    IMPRESSION: No focal osseous abnormality to correspond to the abnormalities seen on MRI.     Scheduled Meds: . amLODipine  5 mg Oral Daily  . aspirin  325 mg Oral Daily  . feeding supplement (RESOURCE BREEZE)  1 Container Oral TID BM  . insulin aspart  0-9 Units Subcutaneous TID WC  . insulin glargine  10 Units Subcutaneous Daily  . metoprolol tartrate  25 mg Oral BID  . OxyCODONE  15 mg Oral Q12H  . pantoprazole  40 mg Oral Daily  . simvastatin  20 mg Oral QPM

## 2014-02-27 NOTE — Consult Note (Signed)
02/27/2014  Referring Provider: No ref. provider found Primary Care Physician:  [name], MD Primary Gastroenterologist:  Dr.Perry  Reason for Consultation:  Abdominal pain, back pain,cirrhosis,pancytopenia  HPI: [name] is a 73 y.o. female , primary patient of Dr. Cordelia Pen and known to Dr. Henrene Pastor from previous procedure In 2011. Patient has developed an illness within the past 4-5 weeks with progressive constant pain from her ischial  tuberosities up her  back into her shoulders and posterior neck. She says her bones are hurting , the pain has been constant over the past month. She has developed associated weakness and is unable to walk unassisted. She has been unable to sleep due to pain and her appetite has been poor. She apparently had complaints of abdominal pain at one point but has not complain of any specific abdominal pain at this time. She was seen in our office on 02/09/2014 by Janett Billow is air and at that time stated that her abdominal pain and chest pain had completely resolved and her only pain was back pain.. She had been hospitalized in February and was rehospitalized 02/23/2014 with complaints of diffuse pain and was noted to have a fever of 100.7 and tachycardia in the office with Dr. Cordelia Pen. She has had an extensive workup over the past 5 weeks which has included CT scans MRIs of the cervical thoracic and lumbar spine, abdominal ultrasounds and a bone survey which was done this admission. She is also developed a pancytopenia with significant drop in all of her counts. Etiology of her illness is unclear at this time CTA of the chest on 2/28 showed a 4 mm right upper lobe pulmonary nodule, MRIs of the lumbar spine showed bulging discs at T11-12 and L1-L2 at the T11-12 level the disc appears to about 30 central spinal cord with moderate severe central canal stenosis at the L3-L4 level there was a bulging disc with abutment of the L3 nerve root. Most recent  CT of the abdomen and pelvis on 319 shows no focal hepatic abnormality there is slight hepatic irregularity cirrhosis could be present mild splenomegaly small cyst in the head of the pancreas and calcific densities in the pancreas possibly related to chronic pancreatic inflammatory change gallbladder nondistended no stones no significant adenopathy. MRI of the cervical spine shows mild bulging discs at multiple levels ,no cord compression there was enhancement pattern of the vertebral body showing geographic areas of non-enhancement throughout the marrow spaces in an unusual pattern that could occur with bone infarction or hematochezia headache malignancy particularly T-cell lymphoma findings not consistent with osteomyelitis or sepsis.  Review of labs shows WBC on 02/04/2014 was 10.1 now 2.4 hemoglobin was 11 now 9.4 platelets were 153 now 52 Bone survey done this admission negative. MRI/ MRCP on 01/29/2014 shows a small multilobular cystic lesion in the head of the pancreas 17 mm x 9 mm x 13 mm no dilation of the pancreatic duct upstream or downstream parenchyma otherwise appears normal, spleen mildly enlarged and morphologic changes of early cirrhosis   Past Medical History  Diagnosis Date  . DIABETES MELLITUS, TYPE II 07/13/2007  . HYPERLIPIDEMIA 12/24/2007  . HYPERTENSION 07/13/2007  . CORONARY ARTERY DISEASE 12/24/2007  . NEPHROLITHIASIS, HX OF 12/24/2007  . Colon polyps 04/29/2010    TUBULAR ADENOMA AND A SERRATED ADENOMA  . Acute pancreatitis   . Unspecified disease of pancreas   . Chronic kidney disease, stage II (mild)     Past Surgical History  Procedure Laterality Date  . Excisional hemorrhoidectomy  unknown  . Abdominal hysterectomy  unknown  . Nasal sinus surgery  unknown  . Cardiac catheterization  2002  . Cataract extraction Bilateral     Prior to Admission medications   Medication Sig Start Date End Date Taking? Authorizing Provider  amLODipine (NORVASC) 5 MG tablet Take 5  mg by mouth daily.   Yes Historical Provider, MD  aspirin 325 MG tablet Take 325 mg by mouth daily.    Yes Historical Provider, MD  glimepiride (AMARYL) 4 MG tablet Take 4 mg by mouth daily before breakfast.   Yes Historical Provider, MD  HYDROcodone-acetaminophen (NORCO/VICODIN) 5-325 MG per tablet Take 1 tablet by mouth every 6 (six) hours as needed for moderate pain or severe pain. 02/06/14  Yes Ripudeep Krystal Eaton, MD  LORazepam (ATIVAN) 0.5 MG tablet Take 1 tablet (0.5 mg total) by mouth every 6 (six) hours as needed for anxiety. 07/04/13  Yes Marletta Lor, MD  meloxicam (MOBIC) 7.5 MG tablet Take 1 tablet (7.5 mg total) by mouth daily. 08/26/13  Yes Marletta Lor, MD  metFORMIN (GLUCOPHAGE) 1000 MG tablet Take 1 tablet (1,000 mg total) by mouth 2 (two) times daily with a meal. 01/20/14  Yes Marletta Lor, MD  oxyCODONE-acetaminophen (PERCOCET) 7.5-325 MG per tablet Take 1 tablet by mouth every 4 (four) hours as needed for pain. 02/22/14  Yes Johnna Acosta, MD  pantoprazole (PROTONIX) 40 MG tablet Take 1 tablet (40 mg total) by mouth daily. 02/06/14  Yes Ripudeep Krystal Eaton, MD  promethazine (PHENERGAN) 12.5 MG tablet Take 2 tablets (25 mg total) by mouth every 6 (six) hours as needed for nausea. 05/10/13  Yes Elwyn Lade, PA-C  simvastatin (ZOCOR) 20 MG tablet Take 20 mg by mouth every evening.   Yes Historical Provider, MD  metoprolol tartrate (LOPRESSOR) 25 MG tablet Take metoprolol 12.5 mg (split 69m tab to half) oral BID (twice a day). 02/06/14   Ripudeep KKrystal Eaton MD  nitroGLYCERIN (NITROSTAT) 0.4 MG SL tablet Place 1 tablet (0.4 mg total) under the tongue every 5 (five) minutes as needed for chest pain. 02/06/14   Ripudeep KKrystal Eaton MD    Current Facility-Administered Medications  Medication Dose Route Frequency Provider Last Rate Last Dose  . acetaminophen (TYLENOL) tablet 650 mg  650 mg Oral Q6H PRN ARise Patience MD       Or  . acetaminophen (TYLENOL) suppository 650 mg  650 mg  Rectal Q6H PRN ARise Patience MD      . amLODipine (NORVASC) tablet 5 mg  5 mg Oral Daily ARise Patience MD   5 mg at 02/27/14 1000  . aspirin tablet 325 mg  325 mg Oral Daily ARise Patience MD   325 mg at 02/27/14 0956  . feeding supplement (RESOURCE BREEZE) (RESOURCE BREEZE) liquid 1 Container  1 Container Oral TID BM Elyse A Shearer, RD   1 Container at 02/27/14 0957  . HYDROmorphone (DILAUDID) injection 2 mg  2 mg Intravenous Q2H PRN ARobbie Lis MD   2 mg at 02/27/14 1210  . insulin aspart (novoLOG) injection 0-9 Units  0-9 Units Subcutaneous TID WC ARise Patience MD   3 Units at 02/27/14 1209  . LORazepam (ATIVAN) tablet 0.5 mg  0.5 mg Oral Q6H PRN ARise Patience MD   0.5 mg at 02/25/14 2248  . metoprolol tartrate (LOPRESSOR) tablet 25 mg  25 mg Oral BID SAnnita Brod  MD   25 mg at 02/27/14 0956  . nitroGLYCERIN (NITROSTAT) SL tablet 0.4 mg  0.4 mg Sublingual Q5 min PRN Rise Patience, MD      . ondansetron [facility]) tablet 4 mg  4 mg Oral Q6H PRN Rise Patience, MD       Or  . ondansetron [facility]) injection 4 mg  4 mg Intravenous Q6H PRN Rise Patience, MD      . oxyCODONE (Oxy IR/ROXICODONE) immediate release tablet 5-10 mg  5-10 mg Oral Q4H PRN Dianne Dun, NP   10 mg at 02/27/14 9604  . OxyCODONE (OXYCONTIN) 12 hr tablet 15 mg  15 mg Oral Q12H Annita Brod, MD   15 mg at 02/27/14 0956  . pantoprazole (PROTONIX) EC tablet 40 mg  40 mg Oral Daily Rise Patience, MD   40 mg at 02/27/14 0956  . polyethylene glycol (MIRALAX / GLYCOLAX) packet 17 g  17 g Oral Daily PRN Annita Brod, MD      . simvastatin (ZOCOR) tablet 20 mg  20 mg Oral QPM Rise Patience, MD   20 mg at 02/26/14 1809  . sodium chloride 0.9 % injection 3 mL  3 mL Intravenous Q12H Rise Patience, MD   3 mL at 02/27/14 0957    Allergies as of 02/23/2014  . (No Known Allergies)    Family History  Problem Relation Age of Onset  . Cancer  Father     throat ca  . COPD Father   . Cancer Sister     uterine ca and breast ca  . Cancer Brother     throat    History   Social History  . Marital Status: Married    Spouse Name: N/A    Number of Children: N/A  . Years of Education: N/A   Occupational History  . Not on file.   Social History Main Topics  . Smoking status: Former Smoker    Types: Cigarettes    Quit date: 12/08/2000  . Smokeless tobacco: Never Used  . Alcohol Use: No  . Drug Use: No  . Sexual Activity: Not on file   Other Topics Concern  . Not on file   Social History Narrative  . No narrative on file    Review of Systems: Gen: no appetite, no nn/v.. +weight loss ,can't sleep  CV: Denies chest pain, angina, palpitations, syncope, orthopnea,  peripheral edema, and claudication. Resp: Denies dyspnea at rest, dyspnea with exercise, cough, sputum, wheezing, . GI: as in HPI. GU : Denies urinary burning, blood in urine, urinary frequency, urinary hesitancy, nocturnal urination, and urinary incontinence. MS: c/o pain from buttocks to neck and shoulders-constant Derm: Denies rash, itching,.  Psych: Denies depression, anxiety, memory loss, suicidal ideation, hallucinations, paranoia, and confusion. Heme: Denies bruising, bleeding, and enlarged lymph nodes. Neuro:  Denies any headaches, dizziness, paresthesias. Endo:  Denies any problems with DM, thyroid, adrenal function.  Physical Exam: Vital signs in last 24 hours: Temp:  [97.7 F (36.5 C)-98.7 F (37.1 C)] 98.4 F (36.9 C) (03/23 0522) Pulse Rate:  [64-108] 108 (03/23 0522) Resp:  [16-18] 16 (03/23 0522) BP: (110-152)/(62-75) 110/68 mmHg (03/23 1159) SpO2:  [94 %-97 %] 95 % (03/23 0522) Weight:  [181 lb 3.5 oz (82.2 kg)] 181 lb 3.5 oz (82.2 kg) (03/23 0522) Last BM Date: 02/26/14 General:  Well-developed older white female, pale and uncomfortable-appearing lying still in the bed Head:  Normocephalic and atraumatic. Eyes:  Sclera  clear, no  icterus.   Conjunctiva pale. Ears:  Normal auditory acuity. Nose:  No deformity, discharge,  or lesions. Mouth: benign Neck:  Supple; no masses or thyromegaly. Lungs:   clear. Heart: Regular rate and rhythm with S1-S2 no murmur rub or gallop Abdomen: Large soft no focal tenderness no guarding or rebound no palpable mass or hepatosplenomegaly bowel sounds are present Rectal: Not done Msk:  Symmetrical without gross deformities. Normal posture. Pulses:  Normal pulses noted. Extremities:  Without clubbing or edema. Neurologic:  Alert and  oriented x4;  grossly normal neurologically. Skin:  Intact without significant lesions or rashes. Cervical Nodes:  No significant cervical adenopathy. Psych:  Alert and cooperative. Normal mood and affect.  Intake/Output from previous day: 03/22 0701 - 03/23 0700 In: 480 [P.O.:480] Out: 475 [Urine:475] Intake/Output this shift: Total I/O In: -  Out: 225 [Urine:225]  Lab Results:  Recent Labs  02/25/14 0230 02/27/14 1015  WBC 2.4* 3.6*  HGB 9.4* 8.8*  HCT 29.0* 26.7*  PLT 52* 39*   BMET  Recent Labs  02/25/14 0230 02/27/14 1015  NA 136* 129*  K 4.4 5.0  CL 101 94*  CO2 21 19  GLUCOSE 201* 299*  BUN 19 41*  CREATININE 1.12* 1.72*  CALCIUM 8.6 8.6   LFT  Recent Labs  02/25/14 0230  PROT 6.3  ALBUMIN 2.7*  AST 28  ALT 7  ALKPHOS 1575*  BILITOT 0.4      Studies/Results: Dg Bone Survey Met  02/27/2014   CLINICAL DATA:  Unusual findings on MRI. Question diffuse metastatic lymphoma.  EXAM: METASTATIC BONE SURVEY  COMPARISON:  None.  FINDINGS: Skull: Hyperostosis frontalis. No focal lytic lesion or acute bony abnormality.  Cervical spine: Mild degenerative disc and facet disease. Normal alignment. Prevertebral soft tissues are normal. No visualized focal lesion or acute bony abnormality.  Thoracic spine: Degenerative spurring throughout the mid and lower thoracic spine. No focal lytic lesion or acute bony abnormality.  Lumbar  spine: Anterior degenerative spurring. Normal alignment. No focal lytic lesion or acute bony abnormality.  Pelvis: Contrast material within the right-sided colon obscures the right iliac bone. Mild degenerative changes in the hips bilaterally which are symmetric. No focal lytic lesion or acute bony abnormality.  Bilateral femurs:  No focal lytic lesion or acute bony abnormality.  Bilateral humeri/ shoulders: Degenerative changes in the [facility] joints bilaterally. No focal lytic lesion or acute bony abnormality.  Chest: Heart is borderline enlarged. Lungs are clear. No effusions or acute bony abnormality. No visible rib abnormality.  IMPRESSION: No focal osseous abnormality to correspond to the abnormalities seen on MRI.   Electronically Signed   By: Rolm Baptise M.D.   On: 02/27/2014 07:31    Impression/Plan;    #49  73 year old female with 4-5 week history of progressive pain which appears to involve her entire back neck and shoulders. This is been associated with generalized weakness poor appetite and weight loss. #2 progressive pancytopenia #3 early cirrhosis and mild splenomegaly #4 abnormal cervical spine imaging with MRI suggestive of hematologic malignancy however bone survey apparently negative #5 adult-onset diabetes mellitus #6 coronary artery disease #7 hypertension #8 chronic kidney disease stage III  #9 small pancreatic head cystic lesion does not appear malignant #10 history of colon polyps    Plan; Will review with Dr. Carlean Purl however at this time do not feel that her illness is related to primary GI issue. She does appear to have early cirrhosis and prior hepatic markers etc. all  negative and therefore most consistent with [name] Eventually the pancreatic cystic lesion may need further evaluation however primary concerns are present are severe back neck and shoulder pain and significant progressive pancytopenia. Oncology is following, suspect she may need a bone marrow biopsy as next step   GI will be available.        Amy Esterwood  02/27/2014, 12:59 PM    [facility] Attending  I have also seen and assessed the patient and agree with the above note. I think the answer is somewhere in her bone marrow and not the liver or GI tract.  Gatha Mayer, MD, [facility] [phone] (pager) 02/27/2014 5:42 PM

## 2014-02-27 NOTE — Progress Notes (Signed)
Inpatient Diabetes Program Recommendations  AACE/ADA: New Consensus Statement on Inpatient Glycemic Control (2013)  Target Ranges:  Prepandial:   less than 140 mg/dL      Peak postprandial:   less than 180 mg/dL (1-2 hours)      Critically ill patients:  140 - 180 mg/dL   Reason for Assessment: Hyperglycemia  Diabetes history: Type 2  Outpatient Diabetes medications: Amaryl 4 mg qd, Glucophage 1000 mg bid Current orders for Inpatient glycemic control: Sensitive correction tid  Results for [name], [name] (MRN [id number]) as of 02/27/2014 12:58  Ref. Range 02/26/2014 07:51 02/26/2014 11:51 02/26/2014 17:20 02/26/2014 21:19 02/27/2014 08:03 02/27/2014 12:04  Glucose-Capillary Latest Range: 70-99 mg/dL 166 (H) 161 (H) 166 (H) 255 (H) 228 (H) 215 (H)   Note: Despite correction, CBG's beginning at HS have all been over 200 mg/dl.  Request MD consider:  Add low-dose basal insulin such as Lantus 10 units either daily or at HS Thank you.  Cecely Rengel S. Marcelline Mates, RN, CNS, CDE Inpatient Diabetes Program, team pager [phone]

## 2014-02-27 NOTE — Consult Note (Signed)
[place]  Telephone:(336) [facility] NOTE  [name]                                MR#: [id number]  DOB: 1941/06/22                       CSN#: [id number]  Referring MD: [facility] Primary MD: Dr.KWIATKOWSKI,PETER Pilar Plate, MD    Reason for Consult: Pancytopenia   LFY:BOFBP [name] is a 73 y.o. female  asked to see for evaluation of Pancytopenia. In review, the patient was admitted due to a 2-3 day history of low back pain and abdominal pain, accompanied with fever up to 102. Of note, the patient had been recently admitted with chest pain, at which time a pancreatic cyst was noted on CT, (She  has an appointment with Dr. Henrene Pastor in April ).she was found to have atrial fibrillation with rapid ventricular rate as well as elevated lactic acid, eventually normalized with IV fluids.  CT of the abdomen and pelvis without contrast on 02/24/2014 Was remarkable for slight hepatic irregularity with mild splenomegaly, suggesting the possibility of cirrhosis. Lipoma noted along the distal ileal psoas muscle on the right was seen, no acute bony abnormality was noted. Pancreatic cyst was once again noted. MRI of the lumbar spine on the same day, was negative for cord compression or discitis osteomyelitis.disc bulging at the root were seen.  Recent CT of the chest on 02/04/2014 had shown 4. 4 mm right upper lobe pulmonary nodule with additional 5 mm right lower lobe pulmonary nodule as above, indeterminate, which was be followed up as an outpatient no PE was noted. Metastatic bone survey on 3/21 negative.   Labs on admission showed a white count of 3.3, ANC of 2.0  with a hemoglobin and hematocrit of 11.3 and 35.1 respectively, MCV of 78.5 and platelets of 91,000. Repeat labs in 02/23/2014 showed a white count of 2.6,H&H of 11 and 33.7 respectively, MCV of 77.8 and platelets dropping to 85,000.   As for her cirrhosis, have extensive serological  evaluation in the hospital for that as well. ANA, AMA, anti-smooth muscle antibody, hepatitis B surface antigen and antibody, and hepatitis C antibody were all negative/normal. Denies ETOH use. CA 19.9 was 9.7  Last colonoscopy was on 5/23/112  At which time, polyps which were removed from the descending colon as well as AV malformations in the cecum and descending colon, and small internal hemorrhoids. One of the polyps was a tubular adenoma and the other was a serrated adenoma with no high-grade dysplasia seen. She was put in for a 5 year colonoscopy recall.   No family history of hematological disorders. No gum bleed. No epistaxis or hemoptysis. Denies easy bruising. No prior episodes of fevers or chills, no night sweats.  Positive weight loss, with decreased appetite.Denies any ticks or other insect bites. No sick exposure.  PMH:  Past Medical History  Diagnosis Date  . DIABETES MELLITUS, TYPE II 07/13/2007  . HYPERLIPIDEMIA 12/24/2007  . HYPERTENSION 07/13/2007  . CORONARY ARTERY DISEASE 12/24/2007  . NEPHROLITHIASIS, HX OF 12/24/2007  . Colon polyps 04/29/2010    TUBULAR ADENOMA AND A SERRATED ADENOMA  . Acute pancreatitis   . Unspecified disease of pancreas   . Chronic kidney disease, stage II (mild)     . Paroxysmal atrial fibrillation-flutter  Surgeries:  Past  Surgical History  Procedure Laterality Date  . Excisional hemorrhoidectomy  unknown  . Abdominal hysterectomy  unknown  . Nasal sinus surgery  unknown  . Cardiac catheterization  2002  . Cataract extraction Bilateral     Allergies: No Known Allergies  Medications:   Prior to Admission:  Prescriptions prior to admission  Medication Sig Dispense Refill  . amLODipine (NORVASC) 5 MG tablet Take 5 mg by mouth daily.      Marland Kitchen aspirin 325 MG tablet Take 325 mg by mouth daily.       Marland Kitchen glimepiride (AMARYL) 4 MG tablet Take 4 mg by mouth daily before breakfast.      . HYDROcodone-acetaminophen (NORCO/VICODIN) 5-325 MG per tablet  Take 1 tablet by mouth every 6 (six) hours as needed for moderate pain or severe pain.  30 tablet  0  . LORazepam (ATIVAN) 0.5 MG tablet Take 1 tablet (0.5 mg total) by mouth every 6 (six) hours as needed for anxiety.  90 tablet  1  . meloxicam (MOBIC) 7.5 MG tablet Take 1 tablet (7.5 mg total) by mouth daily.  90 tablet  1  . metFORMIN (GLUCOPHAGE) 1000 MG tablet Take 1 tablet (1,000 mg total) by mouth 2 (two) times daily with a meal.  180 tablet  0  . oxyCODONE-acetaminophen (PERCOCET) 7.5-325 MG per tablet Take 1 tablet by mouth every 4 (four) hours as needed for pain.  20 tablet  0  . pantoprazole (PROTONIX) 40 MG tablet Take 1 tablet (40 mg total) by mouth daily.  30 tablet  4  . promethazine (PHENERGAN) 12.5 MG tablet Take 2 tablets (25 mg total) by mouth every 6 (six) hours as needed for nausea.  12 tablet  0  . simvastatin (ZOCOR) 20 MG tablet Take 20 mg by mouth every evening.      . metoprolol tartrate (LOPRESSOR) 25 MG tablet Take metoprolol 12.5 mg (split $RemoveB'25mg'HzjaNUmq$  tab to half) oral BID (twice a day).  60 tablet  3  . nitroGLYCERIN (NITROSTAT) 0.4 MG SL tablet Place 1 tablet (0.4 mg total) under the tongue every 5 (five) minutes as needed for chest pain.  60 tablet  5   Scheduled Meds: . amLODipine  5 mg Oral Daily  . aspirin  325 mg Oral Daily  . feeding supplement (RESOURCE BREEZE)  1 Container Oral TID BM  . insulin aspart  0-9 Units Subcutaneous TID WC  . metoprolol tartrate  25 mg Oral BID  . OxyCODONE  15 mg Oral Q12H  . pantoprazole  40 mg Oral Daily  . simvastatin  20 mg Oral QPM  . sodium chloride  3 mL Intravenous Q12H   Continuous Infusions:  PRN Meds:.acetaminophen, acetaminophen, HYDROmorphone (DILAUDID) injection, LORazepam, nitroGLYCERIN, ondansetron (ZOFRAN) IV, ondansetron, oxyCODONE, polyethylene glycol  ROS: Constitutional: Positive for  4 lb weight loss over the last 3 weeks.Positive for fever, no chills , night sweats while in hospital. Negative for  fatigue.    Eyes: Negative for blurred vision and double vision.  Respiratory: Negative for cough. No hemoptysis. No shortness of breath. No pleuritic chest pain.  Cardiovascular: Negative for chest pain. No palpitations.  GI: Negative for  nausea, vomiting, diarrhea or constipation. No change in bowel caliber. No  Melena or hematochezia. Positive  abdominal pain.  GU: Negative for hematuria. No loss of urinary control.No urinary retention. Skin: Negative for itching. No rash. No petechia. No easy bruising.  Neurological: No headaches. No motor or sensory deficits.positive back pain as above  Family  History:    Family History  Problem Relation Age of Onset  . Cancer Father     throat ca  . COPD Father   . Cancer Sister     uterine ca and breast ca  . Cancer Brother     throat    No family history of hematological  disorders.  Social History:  reports that she quit smoking about 13 years ago. Her smoking use included Cigarettes. She smoked 0.00 packs per day. She has never used smokeless tobacco. She reports that she does not drink alcohol or use illicit drugs. She still works as a Field seismologist.Married .one daughter, her son commited suicide. . Lives in [place].full code  Physical Exam    Filed Vitals:   02/27/14 0522  BP: 139/62  Pulse: 108  Temp: 98.4 F (36.9 C)  Resp: 16    General: 73 y.o. female  in no acute distress A. and O. x3  well-developed and well-nourished.  HEENT: Normocephalic, atraumatic, PERRLA. Oral cavity without thrush or lesions. Edentulous Neck supple. no thyromegaly, no cervical or supraclavicular adenopathy  Lungs clear bilaterally . No wheezing, rhonchi or rales. No axillary masses. Breasts: not examined. Cardiac regular rate and rhythm, no murmur , rubs or gallops Abdomen soft nontender , bowel sounds x4. No hepatosplenomegaly. No masses palpable.  GU/rectal: deferred. Extremities no clubbing cyanosis or edema. No bruising or petechial rash. No  inguinal lymph nodes palpable.  Musculoskeletal: no spinal tenderness.  Neuro: Non Focal  Labs:    Recent Labs Lab 02/22/14 0406 02/23/14 1746 02/24/14 0311 02/25/14 0230 02/27/14 1015  WBC 3.3* 2.6* 1.8* 2.4* 3.6*  HGB 11.3* 11.0* 9.7* 9.4* 8.8*  HCT 35.1* 33.7* 29.9* 29.0* 26.7*  PLT 91* 85* 59* 52* 39*  MCV 78.5 77.8* 77.3* 77.3* 76.1*  MCH 25.3* 25.4* 25.1* 25.1* 25.1*  MCHC 32.2 32.6 32.4 32.4 33.0  RDW 16.1* 16.3* 16.2* 16.5* 16.7*  LYMPHSABS 1.2  --  0.5*  --  1.2  MONOABS 0.2  --  0.2  --  0.3  EOSABS 0.0  --  0.0  --  0.0  BASOSABS 0.0  --  0.0  --  0.1   Peripheral blood smear 02/27/2014-the platelets are decreased in number, no platelet clumps. No blasts. Rare nucleated red cell. Moderate ovalocytes and a few teardrops.    Recent Labs Lab 02/22/14 0406 02/23/14 1746 02/24/14 0040 02/24/14 0311 02/25/14 0230 02/27/14 1015  NA 136* 133*  --  138 136* 129*  K 5.3 4.7  --  5.3 4.4 5.0  CL 96 94*  --  102 101 94*  CO2 21 19  --  _0 GLUCOSE 205* 247*  --  192* 201* 299*  BUN 23 25*  --  22 19 41*  CREATININE 1.59* 1.79*  --  1.40* 1.12* 1.72*  CALCIUM 9.0 8.8  --  8.5 8.6 8.6  AST 17  --  _1 --   ALT 7  --  _2 --   ALKPHOS 741*  --  790* 805* 1575*  --   BILITOT 0.5  --  0.5 0.6 0.4  --         Component Value Date/Time   BILITOT 0.4 02/25/2014 0230   BILIDIR 0.2 02/24/2014 0040   IBILI 0.3 02/24/2014 0040     No results found for this basename: INR, PROTIME,  in the last 168 hours  No results found for this basename: DDIMER,  in the last 72 hours   Anemia panel:  No results found for this basename: VITAMINB12, FOLATE, FERRITIN, TIBC, IRON, RETICCTPCT,  in the last 72 hours  Urinalysis    Component Value Date/Time   COLORURINE YELLOW 02/23/2014 1940   APPEARANCEUR CLOUDY* 02/23/2014 1940   LABSPEC 1.027 02/23/2014 1940   PHURINE 5.0 02/23/2014 1940   GLUCOSEU >1000* 02/23/2014 1940   HGBUR NEGATIVE 02/23/2014 1940   HGBUR  negative 10/10/2010 1607   BILIRUBINUR NEGATIVE 02/23/2014 1940   BILIRUBINUR Negative 04/21/2013 1703   KETONESUR 15* 02/23/2014 1940   PROTEINUR NEGATIVE 02/23/2014 1940   UROBILINOGEN 0.2 02/23/2014 1940   UROBILINOGEN 0.2 04/21/2013 1703   NITRITE NEGATIVE 02/23/2014 1940   NITRITE Negative 04/21/2013 1703   LEUKOCYTESUR NEGATIVE 02/23/2014 1940    Drugs of Abuse  No results found for this basename: labopia, cocainscrnur, labbenz, amphetmu, thcu, labbarb      Imaging Studies:  Ct Abdomen Pelvis Wo Contrast  02/23/2014   CLINICAL DATA:  Low back pain.  EXAM: CT ABDOMEN AND PELVIS WITHOUT CONTRAST  TECHNIQUE: Multidetector CT imaging of the abdomen and pelvis was performed following the standard protocol without intravenous contrast.  COMPARISON:  MRI 01/31/2014.  FINDINGS: No focal hepatic abnormality. Slight hepatic irregularity noted. Cirrhosis could present this fashion. Mild splenomegaly. Splenosis. No biliary distention. Cystic changes in the head of pancreas for better imaged with MRI, represents made to prior MRI report. Calcific densities noted in the pancreas could be related to vascular disease and/or chronic pancreatic inflammatory change. Gallbladder is nondistended. No biliary distention.  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 The otherwise unremarkable. No focal renal abnormalities identified. Renal vascular calcification present. No hydronephrosis or obstructing ureteral stone. Bladder is nondistended.  Hysterectomy. Tiny 1.5 cm left ovarian cyst noted. No free pelvic fluid.  No significant adenopathy. Abdominal aorta is atherosclerotic. No aneurysm. Distal vessels are atherosclerotic.  Appendix normal. No inflammatory changes are present in the right lower quadrant. Terminal ileum appears normal. No inflammatory changes left lower quadrant. No significant bowel distention. No free air. No mesenteric mass lesions noted. Small nodular  opacities noted in the region of the gastrohepatic ligament may represent perigastric varices  Lung bases are clear. Coronary artery disease. Abdominal wall is intact. No significant hernia. Lipoma noted along the distal ileal psoas muscle on the right. No acute bony abnormality. Degenerative changes lumbar spine. Degenerative changes both hips.  IMPRESSION: 1. Slight hepatic irregularity with splenomegaly. These findings suggest the possibility of cirrhosis. Small perigastric varices cannot be excluded.  2. Cystic changes previously noted in the pancreas on prior MRI better demonstrated by MRI and continued follow-up as recommended on prior MRI report.  3. Calcific density pancreas may be related to vascular disease however chronic pancreatitis cannot be excluded.  4.  Small left ovarian cyst.  5.  Coronary artery disease.  .   Electronically Signed   By: Noble   On: 02/23/2014 23:30   Ct Angio Chest Pe W/cm &/or Wo Cm  02/04/2014   CLINICAL DATA:  Left upper chest pain  EXAM: CT ANGIOGRAPHY CHEST WITH CONTRAST  TECHNIQUE: Multidetector CT imaging of the chest was performed using the standard protocol during bolus administration of intravenous contrast. Multiplanar CT image reconstructions and MIPs were obtained to evaluate the vascular anatomy.  CONTRAST:  31m OMNIPAQUE IOHEXOL 350 MG/ML SOLN  COMPARISON:  Prior radiograph from earlier the same day.  FINDINGS: Visualized thyroid is unremarkable.  No pathologically enlarged mediastinal, hilar, or axillary lymph nodes are identified.  Prominent atherosclerotic calcification seen within the aortic arch and within the proximal great vessels. Intrathoracic aorta is of normal caliber. Calcifications seen about the aortic valve. Mild cardiomegaly is present. Diffuse 3 vessel coronary artery calcifications present. No pericardial effusion.  Pulmonary arterial tree is well opacified. No filling defect to suggest acute pulmonary embolism. Re-formatted imaging  confirms these findings.  The lungs are clear without parenchymal consolidation, pulmonary edema, or pleural effusion. Mild subsegmental atelectasis seen dependently within the lower lobes bilaterally. 4 mm nodule seen within the right upper lobe (series 6, image 36). Additional subpleural 5 mm nodule seen within the right lower lobe (series 6, image 44). No other definite pulmonary nodules identified.  1.1 cm nodule within the medial limb of the left adrenal gland containing macroscopic fat density is most compatible with an adrenal myelolipoma (series 4, image 79). Remainder the visualized upper abdomen is unremarkable.  No acute osseous abnormality. No worrisome lytic or blastic osseous lesions.  1. No CT evidence of acute pulmonary embolism. 2. No other acute abnormality identified within the thorax. 3. Prominent atherosclerotic calcifications within the intrathoracic aorta with diffuse 3 vessel coronary artery calcifications. 4. 4 mm right upper lobe pulmonary nodule with additional 5 mm right lower lobe pulmonary nodule as above, indeterminate. If the patient is at high risk for bronchogenic carcinoma, follow-up chest CT at 6-12 months is recommended. If the patient is at low risk for bronchogenic carcinoma, follow-up chest CT at 12 months is recommended. This recommendation follows the consensus statement: Guidelines for Management of Small Pulmonary Nodules Detected on CT Scans: A Statement from the [place] as published in Radiology 2005;237:395-400. 5. 1.1 cm left adrenal myelolipoma.  Review of the MIP images confirms the above findings.   Electronically Signed   By: Jeannine Boga M.D.   On: 02/04/2014 06:56   Mr Lumbar Spine Wo Contrast  02/23/2014   CLINICAL DATA:  Back pain, concern for osteomyelitis/discitis.  EXAM: MRI LUMBAR SPINE WITHOUT CONTRAST  TECHNIQUE: Multiplanar, multisequence MR imaging was performed. No intravenous contrast was administered.  COMPARISON:  Prior MRI  from 01/31/2014 and CT from 01/29/2014.  FINDINGS: For the purposes of this dictation, the lowest well-formed intervertebral disc spaces presumed to be the L5-S1 level, and there presumed to be 5 lumbar type vertebral bodies.  Mild straightening of the normal lumbar lordosis. There is no listhesis. Vertebral body heights are preserved. Degenerative endplate Schmorl's node present at the superior endplate of S34.  Mild diffuse congenital shortening of the pedicles noted.  Signal intensity within the vertebral body bone marrow is normal. Signal intensity within the spinal cord is within normal limits as well. Conus medullaris terminates at the L1 level. No abnormal STIR signal seen within the intervertebral discs or vertebral bodies to suggest osteomyelitis/ discitis. No epidural fluid collection. Mildly prominent epidural fat noted within the lower lumbar spine.  At T11-12, seen only on sagittal projection. There is degenerative disc bulging with bilateral facet arthrosis. The bulging disc appears to abut the ventral spinal cord with resultant moderate to severe central canal stenosis. No definite cord signal changes identified. Canal stenosis (series 5, image 6). Mild right foraminal narrowing is present this level as well.  At T12-L1, prominent degenerative endplate spurring seen anteriorly. There is mild disc desiccation without significant disc bulge. No focal disc protrusion or facet arthrosis. No canal or neural foraminal stenosis.  At L1-2, there is degenerative disc desiccation  with mild diffuse disc bulge. Mild bilateral facet hypertrophy. No canal or neural foraminal stenosis.  At L2-3, there is degenerative disc desiccation with associated degenerative endplate changes anteriorly. Mild anterior osteophytic spurring. There is mild disc bulge without focal disc protrusion. Mild bilateral facet hypertrophy and ligamentum flavum thickening. No canal or neural foraminal stenosis.  At L3-4, there is disc  desiccation with mild diffuse disc bulge mild bilateral facet arthrosis. There is resultant moderate canal narrowing, largely due to short pedicles. There is no focal disc protrusion, although the bulging disc appears to abut the exiting left L3 nerve root as it courses out of the neural foramen (series 8, image 16). Neural foramina remain widely patent.  At L4-5, there is degenerative disc desiccation with mild-to-moderate bilateral facet hypertrophy and ligamentum flavum thickening. Moderate central canal stenosis present, largely due to short pedicles. There is mild left foraminal narrowing without significant right foraminal stenosis. No focal disc herniation at this level.  At L5-S1, there is degenerative disc desiccation with diffuse disc bulging and mild bilateral facet hypertrophy. A superimposed left foraminal disc protrusion is present, abutting the left L5 nerve root as it courses through the left neural foramen (series 8, image 28). There is moderate left foraminal stenosis. No significant canal or right neural foraminal narrowing. Prominent epidural lipomatosis noted at this level.  Visualized paraspinous musculature is within normal limits. Visualized visceral structures are unremarkable. No retroperitoneal adenopathy.  IMPRESSION: 1. No MRI evidence of osteomyelitis/discitis or other infectious process within the lower back. 2. Right central disc bulge/protrusion at T11-12 with resultant moderate to severe central canal stenosis. No definite cord signal changes identified. 3. Left foraminal disc protrusion at L5-S1 impinging upon the left L5 nerve root and resulting and moderate left neural foraminal stenosis. 4. Mild degenerative disc bulging and facet arthrosis at L3-4 and L4-5, which superimposed a short pedicles results in moderate canal stenosis. At the L3-4 level, the bulging disc appears to abut the exiting left L3 nerve root as it courses out of the left neural foramen.   Electronically Signed    By: Jeannine Boga M.D.   On: 02/23/2014 22:51   Mr Cervical Spine W Wo Contrast  02/24/2014   CLINICAL DATA:  Severe back pain.  Fever.  EXAM: MRI CERVICAL AND thoracic SPINE WITHOUT AND WITH CONTRAST  TECHNIQUE: Multiplanar and multiecho pulse sequences of the cervical spine, to include the craniocervical junction and cervicothoracic junction, and thoracic spine, were obtained without and with intravenous contrast.  CONTRAST:  63m MULTIHANCE GADOBENATE DIMEGLUMINE 529 MG/ML IV SOLN  COMPARISON:  Lumbar study done yesterday.  Chest CT 02/04/2014.  FINDINGS: MRI CERVICAL SPINE FINDINGS  Alignment of the spine is normal. There are mild bulging discs at C3-4, C4-5, C5-6 and C6-7. These narrow the ventral subarachnoid space but do not compress the cord. No cord lesion. Enhancement pattern of the vertebral bodies shows geographic areas of nonenhancement throughout the marrow spaces. This is an unusual pattern that can occur with either bone infarction or can be seen an other hematopoetic malignancies particularly T-cell lymphoma. I do not see enhancement pattern that would allow diagnosis of osteomyelitis or any septic joint.  MRI thoracic SPINE FINDINGS  Alignment is normal. There are disc bulges in the mid and lower thoracic region but no herniation or compression of the neural structures. No abnormal cord signal. There is a small amount of layering pleural fluid bilaterally. Again demonstrated throughout the thoracic region are areas of geographic nonenhancement within the bones.  This pattern can be seen with widespread bone infarction or hematopoetic states particularly T-cell lymphoma. There is no sign of focal enhancement to suggest osteomyelitis or septic joint. I think this marrow pattern also may be affecting the ribs, an this could be a systemic osseous condition.  IMPRESSION: There is no finding to allow diagnosis of focal bone or joint infection. No fracture. No significant disc pathology.  Areas  of geographic nonenhancement throughout the vertebral body marrow and probably also affecting the ribs. This pattern can be seen in widespread bone infarction or in hematopoetic diseases, particularly T-cell lymphoma.   Electronically Signed   By: Nelson Chimes M.D.   On: 02/24/2014 20:05   Mr Thoracic Spine W Wo Contrast  02/24/2014   CLINICAL DATA:  Severe back pain.  Fever.  EXAM: MRI CERVICAL AND thoracic SPINE WITHOUT AND WITH CONTRAST  TECHNIQUE: Multiplanar and multiecho pulse sequences of the cervical spine, to include the craniocervical junction and cervicothoracic junction, and thoracic spine, were obtained without and with intravenous contrast.  CONTRAST:  61m MULTIHANCE GADOBENATE DIMEGLUMINE 529 MG/ML IV SOLN  COMPARISON:  Lumbar study done yesterday.  Chest CT 02/04/2014.  FINDINGS: MRI CERVICAL SPINE FINDINGS  Alignment of the spine is normal. There are mild bulging discs at C3-4, C4-5, C5-6 and C6-7. These narrow the ventral subarachnoid space but do not compress the cord. No cord lesion. Enhancement pattern of the vertebral bodies shows geographic areas of nonenhancement throughout the marrow spaces. This is an unusual pattern that can occur with either bone infarction or can be seen an other hematopoetic malignancies particularly T-cell lymphoma. I do not see enhancement pattern that would allow diagnosis of osteomyelitis or any septic joint.  MRI thoracic SPINE FINDINGS  Alignment is normal. There are disc bulges in the mid and lower thoracic region but no herniation or compression of the neural structures. No abnormal cord signal. There is a small amount of layering pleural fluid bilaterally. Again demonstrated throughout the thoracic region are areas of geographic nonenhancement within the bones. This pattern can be seen with widespread bone infarction or hematopoetic states particularly T-cell lymphoma. There is no sign of focal enhancement to suggest osteomyelitis or septic joint. I think  this marrow pattern also may be affecting the ribs, an this could be a systemic osseous condition.  IMPRESSION: There is no finding to allow diagnosis of focal bone or joint infection. No fracture. No significant disc pathology.  Areas of geographic nonenhancement throughout the vertebral body marrow and probably also affecting the ribs. This pattern can be seen in widespread bone infarction or in hematopoetic diseases, particularly T-cell lymphoma.   Electronically Signed   By: MNelson ChimesM.D.   On: 02/24/2014 20:05   UKoreaAbdomen Complete  02/24/2014   CLINICAL DATA:  Abdominal pain.  EXAM: ULTRASOUND ABDOMEN COMPLETE  COMPARISON:  CT abdomen and pelvis 02/23/2014.  FINDINGS: Gallbladder:  No gallstones or wall thickening visualized. No sonographic Murphy sign noted.  Common bile duct:  Diameter: 0.4 cm  Liver:  Nodularity of the liver border is better demonstrated on CT scan. No focal liver lesion or intrahepatic biliary ductal dilatation.  IVC:  No abnormality visualized.  Pancreas:  Visualized portion unremarkable.  Spleen:  Size and appearance within normal limits.  Right Kidney:  Length: 11.3 cm. Echogenicity within normal limits. No mass or hydronephrosis visualized.  Left Kidney:  Length: 11.6 cm. Echogenicity within normal limits. No mass or hydronephrosis visualized.  Abdominal aorta:  No aneurysm visualized.  Other findings:  None.  IMPRESSION: Negative for gallstones.  No acute finding.  Nodularity of the liver border consistent with cirrhosis is better demonstrated on comparison CT scan.   Electronically Signed   By: Inge Rise M.D.   On: 02/24/2014 03:45   US Abdomen Complete  01/29/2014   CLINICAL DATA:  Epigastric abdominal pain. Current history of diabetes and hypertension. Prior history of urinary tract calculi.  EXAM: ULTRASOUND ABDOMEN COMPLETE  COMPARISON:  CT abdomen and pelvis 02/13/2007.  FINDINGS: Gallbladder:  No shadowing gallstones or echogenic sludge. No gallbladder wall  thickening or pericholecystic fluid. Negative sonographic Murphy sign according to the ultrasound technologist.  Common bile duct:  Diameter: Approximately 4 mm diameter.  Liver:  Normal size and echotexture without focal parenchymal abnormality. Patent portal vein.  IVC:  Patent.  Pancreas:  Thickened, heterogeneous head and proximal body. Distal body and tail obscured by overlying bowel gas.  Spleen:  Borderline enlarged measuring approximately 13.1 x 5.1 x 12.6 cm, yielding a volume of approximately 420 cc. No focal splenic parenchymal abnormality.  Right Kidney:  Length: Approximately 10.7 cm. Mild diffuse cortical thinning consistent with age. No hydronephrosis. No focal parenchymal abnormality. No visible shadowing calculi.  Left Kidney:  Length: Approximately 11.3 cm. Mild cortical thinning involving the upper pole with well-preserved cortex elsewhere. No hydronephrosis. No focal parenchymal abnormality. No visible shadowing calculi.  Abdominal aorta:  Normal in caliber throughout its visualized course in the abdomen with evidence of atherosclerosis. Obscured distally by overlying bowel gas. Maximum diameter 2.4 cm.  Other findings:  None.  IMPRESSION: 1. Thickened, heterogeneous pancreatic head and proximal body. Is there clinical evidence of pancreatitis? Distal body and tail were obscured by overlying bowel gas. 2. Borderline splenomegaly without focal splenic parenchymal abnormality. 3. Normal-appearing gallbladder.  No biliary ductal dilation.   Electronically Signed   By: Evangeline Dakin M.D.   On: 01/29/2014 16:46   Ct Abdomen Pelvis W Contrast  01/29/2014   CLINICAL DATA:  Abdominal and pelvic pain with nausea.  EXAM: CT ABDOMEN AND PELVIS WITH CONTRAST  TECHNIQUE: Multidetector CT imaging of the abdomen and pelvis was performed using the standard protocol following bolus administration of intravenous contrast.  CONTRAST:  171m OMNIPAQUE IOHEXOL 300 MG/ML  SOLN  COMPARISON:  02/13/2007 CT and  01/29/2014 ultrasound  FINDINGS: The liver has a slightly nodular contour suspicious for cirrhosis.  The spleen is upper limits of normal in size.  Mild to moderate bilateral renal cortical thinning is noted.  Adrenal glands are unremarkable except for at a stable small left adrenal myelolipoma.  There has been interval development of a 1.1 x 1.5 cm cystic structure within the pancreatic head (image number 33). There is no evidence of pancreatic ductal dilatation or the pancreatic abnormality.  There is no evidence of abdominal aortic aneurysm, biliary dilatation or worrisome enlarged lymph nodes.  There are few upper normal caliber small bowel loops within the mid abdomen containing gas and some fluid - nonspecific. There is no evidence of bowel obstruction, pneumoperitoneum, or abscess.  Remainder of the bowel and appendix are unremarkable.  The bladder is within normal limits.  The patient is status post hysterectomy.  No acute or suspicious bony abnormalities are noted. Degenerative changes within the lumbar spine are again noted. .Marland Kitchen IMPRESSION: Nonspecific nonobstructive bowel gas pattern with upper limits of normal caliber gas and fluid filled small bowel in the mid abdomen. This probably represents a mild ileus.  1.1 x 1.5 cm pancreatic  head cystic structure - new since 2008. MR followup in 1 year is recommended to evaluate stability. This may represent an indolent cystic lesion or IPMN.  Probable cirrhosis.  Upper limits of normal spleen size.   Electronically Signed   By: [name] M.D.   On: 01/29/2014 20:49   Mr 3d Recon At Scanner  01/31/2014   CLINICAL DATA:  One-day history of abdominal pain in the epigastric region radiating to the back.  EXAM: MR 3D RECON AT SCANNER; MR ABDOMEN WO/W CM MRCP  TECHNIQUE: Multiplanar multisequence MR imaging of the abdomen was performed, including heavily T2-weighted images of the biliary and pancreatic ducts. Three-dimensional MR images were rendered by post  processing of the original MR data.  CONTRAST:  1mL MULTIHANCE GADOBENATE DIMEGLUMINE 529 MG/ML IV SOLN  COMPARISON:  CT ABD/PELVIS W CM dated 01/29/2014  FINDINGS: Liver has a lobular contour with enlarged caudate lobe suggesting underlying cirrhosis. No evidence of underlying architectural distortion. Portal veins are patent. No evidence ascites. No significant periportal lymphadenopathy. Gallbladder is normal. There is no intrahepatic biliary duct dilatation. The common hepatic duct and common bile duct are normal caliber.  There is a small multilobular cystic lesion in the head of the pancreas which is immediately adjacent to the pancreatic duct. This lesion measures 17 mm x 9 mm by 13 mm (image 21, series 5). No evidence of dilatation of the pancreas hepatic duct upstream or downstream from this lesion. The pancreatic parenchyma otherwise appears normal. The pancreatic duct is normal through the body and tail. No evidence of enhancement of the cystic lesion in the head of the pancreas. No enhancing lesions throughout the body of the pancreas. No enhancing hepatic lesion.  Spleen is mildly enlarged at 580 cubic cm. The splenic vein is patent. There is thickening of the left adrenal gland which has loss of signal intensity on the post phase imaging consistent with a benign adrenal adenoma. Within the left adrenal gland is a rounded 8 mm lesion (image 47, series 1300) consistent with a small myelolipoma. Kidneys are normal. No abdominal adenopathy.  IMPRESSION: 1. Morphologic changes of early cirrhosis. No evidence architectural distortion or significant portal hypertension. 2. Mild splenomegaly. 3. Cystic lesion in the head of the pancreas may well communicate with the pancreatic duct. Differential considerations include IPMT and post inflammatory cystic change. Recommend followup MRI without with contrast in 12 months for re-evaluation. This recommendation follows ACR consensus guidelines: Managing Incidental  Findings on Abdominal CT: White Paper of the ACR Incidental Findings Committee. J Am Coll Radiol 2010;7:754-773 4. Benign adenoma and myelolipoma of the left adrenal gland.   Electronically Signed   By: Suzy Bouchard M.D.   On: 01/31/2014 13:00   Nm Myocar Multi W/spect W/wall Motion / Ef  02/05/2014   CLINICAL DATA:  Chest pain, history of diabetes and CAD  EXAM: MYOCARDIAL IMAGING WITH SPECT (REST AND PHARMACOLOGIC-STRESS)  GATED LEFT VENTRICULAR WALL MOTION STUDY  LEFT VENTRICULAR EJECTION FRACTION  TECHNIQUE: Standard myocardial SPECT imaging was performed after resting intravenous injection of 10 mCi Tc-58m sestamibi. Subsequently, intravenous infusion of Lexiscan was performed under the supervision of the Cardiology staff. At peak effect of the drug, 30 mCi Tc-12m sestamibi was injected intravenously and standard myocardial SPECT imaging was performed. Quantitative gated imaging was also performed to evaluate left ventricular wall motion, and estimate left ventricular ejection fraction.  COMPARISON:  CT ANGIO CHEST W/CM &/OR WO/CM dated 02/04/2014; DG CHEST 2 VIEW dated 02/04/2014  FINDINGS: Review of  the rotational raw images demonstrates significant breast and chest wall attenuation on both the provided rest and stress images. Mild GI activity is seen on both the provided rest and stress images. No significant patient motion artifact.  SPECT imaging demonstrates there is a very minimal amount of matched attenuation involving the left ventricular apex which is without associated wall motion abnormality. No scintigraphic evidence of prior infarction or pharmacologically induced ischemia.  Quantitative gated analysis demonstrates normal wall motion.  The resting left ventricular ejection fraction is 63% with end-diastolic volume of 57 ml and end-systolic volume of 21 ml.  IMPRESSION: 1. No scintigraphic evidence of prior infarction or pharmacologically induced ischemia. 2. Normal wall motion.  Ejection  fraction is 63%.   Electronically Signed   By: Simonne Come M.D.   On: 02/05/2014 12:34   Dg Chest Port 1 View  02/23/2014   CLINICAL DATA:  Fever.  EXAM: PORTABLE CHEST - 1 VIEW  COMPARISON:  Chest x-ray 01/29/2014 .  FINDINGS: Mediastinum and hilar structures are normal. Heart size upper limits normal, no pulmonary venous congestion. Minimal left base infiltrate cannot be excluded. No pleural effusion or pneumothorax. No acute bony abnormality. Degenerative changes thoracic spine and both shoulders.  IMPRESSION: Cannot exclude a minimal infiltrate in the left lung base.   Electronically Signed   By: Maisie Fus  Register   On: 02/23/2014 18:45   Dg Bone Survey Met  02/27/2014   CLINICAL DATA:  Unusual findings on MRI. Question diffuse metastatic lymphoma.  EXAM: METASTATIC BONE SURVEY  COMPARISON:  None.  FINDINGS: Skull: Hyperostosis frontalis. No focal lytic lesion or acute bony abnormality.  Cervical spine: Mild degenerative disc and facet disease. Normal alignment. Prevertebral soft tissues are normal. No visualized focal lesion or acute bony abnormality.  Thoracic spine: Degenerative spurring throughout the mid and lower thoracic spine. No focal lytic lesion or acute bony abnormality.  Lumbar spine: Anterior degenerative spurring. Normal alignment. No focal lytic lesion or acute bony abnormality.  Pelvis: Contrast material within the right-sided colon obscures the right iliac bone. Mild degenerative changes in the hips bilaterally which are symmetric. No focal lytic lesion or acute bony abnormality.  Bilateral femurs:  No focal lytic lesion or acute bony abnormality.  Bilateral humeri/ shoulders: Degenerative changes in the [facility] joints bilaterally. No focal lytic lesion or acute bony abnormality.  Chest: Heart is borderline enlarged. Lungs are clear. No effusions or acute bony abnormality. No visible rib abnormality.  IMPRESSION: No focal osseous abnormality to correspond to the abnormalities seen on MRI.    Electronically Signed   By: Charlett Nose M.D.   On: 02/27/2014 07:31   Mr Roe Coombs W/wo Cm/mrcp  01/31/2014   CLINICAL DATA:  One-day history of abdominal pain in the epigastric region radiating to the back.  EXAM: MR 3D RECON AT SCANNER; MR ABDOMEN WO/W CM MRCP  TECHNIQUE: Multiplanar multisequence MR imaging of the abdomen was performed, including heavily T2-weighted images of the biliary and pancreatic ducts. Three-dimensional MR images were rendered by post processing of the original MR data.  CONTRAST:  60mL MULTIHANCE GADOBENATE DIMEGLUMINE 529 MG/ML IV SOLN  COMPARISON:  CT ABD/PELVIS W CM dated 01/29/2014  FINDINGS: Liver has a lobular contour with enlarged caudate lobe suggesting underlying cirrhosis. No evidence of underlying architectural distortion. Portal veins are patent. No evidence ascites. No significant periportal lymphadenopathy. Gallbladder is normal. There is no intrahepatic biliary duct dilatation. The common hepatic duct and common bile duct are normal caliber.  There is a small  multilobular cystic lesion in the head of the pancreas which is immediately adjacent to the pancreatic duct. This lesion measures 17 mm x 9 mm by 13 mm (image 21, series 5). No evidence of dilatation of the pancreas hepatic duct upstream or downstream from this lesion. The pancreatic parenchyma otherwise appears normal. The pancreatic duct is normal through the body and tail. No evidence of enhancement of the cystic lesion in the head of the pancreas. No enhancing lesions throughout the body of the pancreas. No enhancing hepatic lesion.  Spleen is mildly enlarged at 580 cubic cm. The splenic vein is patent. There is thickening of the left adrenal gland which has loss of signal intensity on the post phase imaging consistent with a benign adrenal adenoma. Within the left adrenal gland is a rounded 8 mm lesion (image 47, series 1300) consistent with a small myelolipoma. Kidneys are normal. No abdominal adenopathy.   IMPRESSION: 1. Morphologic changes of early cirrhosis. No evidence architectural distortion or significant portal hypertension. 2. Mild splenomegaly. 3. Cystic lesion in the head of the pancreas may well communicate with the pancreatic duct. Differential considerations include IPMT and post inflammatory cystic change. Recommend followup MRI without with contrast in 12 months for re-evaluation. This recommendation follows ACR consensus guidelines: Managing Incidental Findings on Abdominal CT: White Paper of the ACR Incidental Findings Committee. J Am Coll Radiol 2010;7:754-773 4. Benign adenoma and myelolipoma of the left adrenal gland.   Electronically Signed   By: Suzy Bouchard M.D.   On: 01/31/2014 13:00      A/P: 73 y.o. female asked to see for evaluation of pancytopenia. Rondel Jumbo, PA-C 02/27/2014 11:56 AM  [name] was interviewed and examined. The peripheral blood smear was reviewed.  Impression: 1. Pancytopenia 2. Red cell microcytosis 3. CT changes of cirrhosis 4. Markedly elevated LDH 5. Back pain 6. Fever 7. Cystic pancreas lesion  Ms. Grega presents with a fever, failure to thrive, pancytopenia, pain, and a markedly elevated LDH. I reviewed the thoracic spine MRI in radiology. The bone marrow is diffusely abnormal with areas of enhancement. I suspect she has a disseminated malignancy such as a hematologic malignancy. The differential diagnosis includes metastatic carcinoma.  Recommendations: 1. Diagnostic bone marrow biopsy 2. Image guided biopsy of a spine lesion if the bone marrow is nondiagnostic 3. Check ferritin level  I will followup on the results bone marrow biopsy and recommend treatment as indicated.

## 2014-02-27 NOTE — Evaluation (Signed)
Occupational Therapy Evaluation Patient Details Name: [name] MRN: [id number] DOB: [birth year] Today's Date: 02/27/2014 Time: 1350-1404 OT Time Calculation (min): 14 min  OT Assessment / Plan / Recommendation History of present illness pt admittedm 3/19 with c/o  neck and back pain, fever. 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   Clinical Impression   Pt presents to OT with decreased I with ADL activity and will benefit from skilled OT to increase I with ADL activity and return to PLOF    OT Assessment  Patient needs continued OT Services    Follow Up Recommendations  SNF;Home health OT;Supervision/Assistance - 24 hour       Equipment Recommendations  None recommended by OT (TBD)       Frequency  Min 2X/week    Precautions / Restrictions Precautions Precautions: Fall       ADL  Where Assessed - Grooming: Unsupported sitting Upper Body Bathing: Minimal assistance Where Assessed - Upper Body Bathing: Unsupported sitting Lower Body Bathing: Maximal assistance Where Assessed - Lower Body Bathing: Supported sit to stand Upper Body Dressing: Min guard Where Assessed - Upper Body Dressing: Unsupported sitting Lower Body Dressing: Maximal assistance Where Assessed - Lower Body Dressing: Supported sit to Lobbyist: Moderate assistance Toilet Transfer Method: Sit to stand Toileting - Clothing Manipulation and Hygiene: Moderate assistance Where Assessed - Toileting Clothing Manipulation and Hygiene: Standing    OT Diagnosis: Acute pain  OT Problem List: Decreased strength;Decreased activity tolerance;Impaired balance (sitting and/or standing) OT Treatment Interventions: Self-care/ADL training;Patient/family education;DME and/or AE instruction   OT Goals(Current goals can be found in the care plan section) Acute Rehab OT Goals Patient Stated Goal: I want to walk. OT Goal  Formulation: With patient Time For Goal Achievement: 03/13/14  Visit Information  Last OT Received On: 02/27/14 Assistance Needed: +2 History of Present Illness: pt admittedm 3/19 with c/o  neck and back pain, fever. 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       Prior [place] expects to be discharged to:: Private residence Living Arrangements: Spouse/significant other Available Help at Discharge: Family Type of Home: House Home Access: Stairs to enter Technical brewer of Steps: 1 Entrance Stairs-Rails: None Home Layout: One level Home Equipment: None Prior Function Level of Independence: Independent Communication Communication: No difficulties         Vision/Perception Vision - History Patient Visual Report: No change from baseline   Cognition  Cognition Arousal/Alertness: Awake/alert Behavior During Therapy: Anxious;Restless Overall Cognitive Status: Within Functional Limits for tasks assessed    Extremity/Trunk Assessment Upper Extremity Assessment Upper Extremity Assessment: RUE deficits/detail RUE Deficits / Details: tremors present LUE Deficits / Details: tremors present Lower Extremity Assessment Lower Extremity Assessment: Generalized weakness     Mobility Bed Mobility Overal bed mobility: Needs Assistance Bed Mobility: Rolling;Sidelying to Sit Rolling: Mod assist Sidelying to sit: Max assist;HOB elevated General bed mobility comments: HOB raised fully to assist with getting  to upright, pain limiting Transfers Overall transfer level: Needs assistance Equipment used: Rolling walker (2 wheeled) Transfers: Sit to/from [facility] Sit to Stand: Mod assist Stand pivot transfers: Mod assist General transfer comment: cues for hand placement, extra time for pain control while pivoting from recliner to [facility] and  back., pt is very Marketing executive  End of Session OT - End of Session Activity Tolerance: Patient limited by pain Patient left: in bed;with call bell/phone within reach;with nursing/sitter in room Nurse Communication: Mobility status  GO     Betsy Pries 02/27/2014, 2:11 PM

## 2014-02-27 NOTE — Evaluation (Signed)
Physical Therapy Evaluation Patient Details Name: [name] MRN: [id number] DOB: [birth year] Today's Date: 02/27/2014 Time: [id number] PT Time Calculation (min): 27 min  PT Assessment / Plan / Recommendation History of Present Illness  pt admittedm 3/19 with c/o  neck and back pain, fever. 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  Clinical Impression   Pt is very anxious and tremulous. Pt reports today she is functioning much worse. Pt will benefit from PT to address problems listed. Pt states it is too early to think about needing SNF rehab, but may benefit.    PT Assessment  Patient needs continued PT services    Follow Up Recommendations  SNF (unless pt  improves to level to DC home with 24/7 )    Does the patient have the potential to tolerate intense rehabilitation      Barriers to Discharge        Equipment Recommendations  Rolling walker with 5" wheels    Recommendations for Other Services     Frequency Min 3X/week    Precautions / Restrictions Precautions Precautions: Fall   Pertinent Vitals/Pain 7 pain in back, RN aware.      Mobility  Bed Mobility Overal bed mobility: Needs Assistance Bed Mobility: Rolling;Sidelying to Sit Rolling: Mod assist Sidelying to sit: Max assist;HOB elevated General bed mobility comments: HOB raised fully to assist with getting  to upright, pain limiting Transfers Overall transfer level: Needs assistance Equipment used: Rolling walker (2 wheeled) Transfers: Sit to/from [facility] Sit to Stand: Mod assist Stand pivot transfers: Mod assist General transfer comment: cues for hand placement, extra time for pain control while pivoting from recliner to [facility] and back., pt is very shakey Ambulation/Gait Ambulation/Gait assistance: Mod assist Ambulation Distance (Feet): 5 Feet Assistive device: Rolling walker (2 wheeled) General  Gait Details: extra time  getting balance and taking a few steps.    Exercises     PT Diagnosis: Difficulty walking;Acute pain;Generalized weakness  PT Problem List: Decreased strength;Decreased range of motion;Decreased activity tolerance;Decreased mobility;Decreased balance;Decreased safety awareness;Decreased knowledge of precautions;Decreased knowledge of use of DME;Pain PT Treatment Interventions: DME instruction;Gait training;Functional mobility training;Therapeutic activities;Therapeutic exercise;Patient/family education     PT Goals(Current goals can be found in the care plan section) Acute Rehab PT Goals Patient Stated Goal: I want to walk. PT Goal Formulation: With patient Time For Goal Achievement: 03/13/14 Potential to Achieve Goals: Good  Visit Information  Last PT Received On: 02/27/14 Assistance Needed: +2 History of Present Illness: pt admittedm 3/19 with c/o  neck and back pain, fever. 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       Prior [place] expects to be discharged to:: Private residence Living Arrangements: Spouse/significant other Available Help at Discharge: Family Type of Home: House Home Access: Stairs to enter Technical brewer of Steps: 1 Entrance Stairs-Rails: None Home Equipment: None Prior Function Level of Independence: Independent Communication Communication: No difficulties    Cognition  Cognition Arousal/Alertness: Awake/alert Behavior During Therapy: Anxious;Restless Overall Cognitive Status: Within Functional Limits for tasks assessed    Extremity/Trunk Assessment Upper Extremity Assessment Upper Extremity Assessment: RUE deficits/detail;LUE deficits/detail RUE Deficits / Details: trmors present LUE Deficits / Details: tremors present Lower Extremity Assessment Lower Extremity Assessment: Generalized  weakness   Balance    End of Session PT - End of Session Equipment Utilized  During Treatment: Gait belt Activity Tolerance: Patient limited by pain Patient left: in chair;with call bell/phone within reach;with nursing/sitter in room Nurse Communication: Mobility status  GP     [name] 02/27/2014, 12:52 PM [phone]

## 2014-02-28 ENCOUNTER — Encounter (HOSPITAL_COMMUNITY): Payer: Self-pay | Admitting: Radiology

## 2014-02-28 ENCOUNTER — Inpatient Hospital Stay (HOSPITAL_COMMUNITY): Payer: Medicare HMO

## 2014-02-28 DIAGNOSIS — M899 Disorder of bone, unspecified: Secondary | ICD-10-CM

## 2014-02-28 DIAGNOSIS — M949 Disorder of cartilage, unspecified: Secondary | ICD-10-CM

## 2014-02-28 DIAGNOSIS — G893 Neoplasm related pain (acute) (chronic): Secondary | ICD-10-CM

## 2014-02-28 LAB — URINALYSIS, ROUTINE W REFLEX MICROSCOPIC
Bilirubin Urine: NEGATIVE
Glucose, UA: >1000 mg/dL — AB
Hgb urine dipstick: NEGATIVE
Ketones, ur: NEGATIVE mg/dL
LEUKOCYTES UA: NEGATIVE
NITRITE: NEGATIVE
PH: 5 (ref 5.0–8.0)
Protein, ur: NEGATIVE mg/dL
SPECIFIC GRAVITY, URINE: 1.016 (ref 1.005–1.030)
Urobilinogen, UA: 0.2 mg/dL (ref 0.0–1.0)

## 2014-02-28 LAB — UIFE/LIGHT CHAINS/TP QN, 24-HR UR
ALBUMIN, U: DETECTED
ALPHA 1 UR: DETECTED — AB
ALPHA 2 UR: DETECTED — AB
Beta, Urine: DETECTED — AB
Free Kappa Lt Chains,Ur: 27.3 mg/dL — ABNORMAL HIGH (ref 0.14–2.42)
Free Kappa/Lambda Ratio: 6.98 ratio (ref 2.04–10.37)
Free Lambda Lt Chains,Ur: 3.91 mg/dL — ABNORMAL HIGH (ref 0.02–0.67)
GAMMA UR: DETECTED — AB
Total Protein, Urine: 44.9 mg/dL

## 2014-02-28 LAB — GLUCOSE, CAPILLARY
GLUCOSE-CAPILLARY: 222 mg/dL — AB (ref 70–99)
GLUCOSE-CAPILLARY: 191 mg/dL — AB (ref 70–99)
Glucose-Capillary: 159 mg/dL — ABNORMAL HIGH (ref 70–99)
Glucose-Capillary: 166 mg/dL — ABNORMAL HIGH (ref 70–99)

## 2014-02-28 LAB — PROTEIN ELECTROPHORESIS, SERUM
ALBUMIN ELP: 45.2 % — AB (ref 55.8–66.1)
Alpha-1-Globulin: 14 % — ABNORMAL HIGH (ref 2.9–4.9)
Alpha-2-Globulin: 18.9 % — ABNORMAL HIGH (ref 7.1–11.8)
Beta 2: 4.3 % (ref 3.2–6.5)
Beta Globulin: 6 % (ref 4.7–7.2)
Gamma Globulin: 11.6 % (ref 11.1–18.8)
M-SPIKE, %: NOT DETECTED g/dL
TOTAL PROTEIN ELP: 5.2 g/dL — AB (ref 6.0–8.3)

## 2014-02-28 LAB — URINE MICROSCOPIC-ADD ON

## 2014-02-28 LAB — PROCALCITONIN: Procalcitonin: 3.15 ng/mL

## 2014-02-28 LAB — FERRITIN: Ferritin: 2727 ng/mL — ABNORMAL HIGH (ref 10–291)

## 2014-02-28 LAB — BONE MARROW EXAM

## 2014-02-28 MED ORDER — FENTANYL CITRATE 0.05 MG/ML IJ SOLN
INTRAMUSCULAR | Status: AC
  Filled 2014-02-28: qty 6

## 2014-02-28 MED ORDER — HYDROMORPHONE HCL PF 2 MG/ML IJ SOLN: 4.0000 mg | INTRAMUSCULAR | Status: DC | PRN

## 2014-02-28 MED ORDER — FENTANYL CITRATE 0.05 MG/ML IJ SOLN
INTRAMUSCULAR | Status: AC | PRN
  Administered 2014-02-28 (×2): 25 ug via INTRAVENOUS

## 2014-02-28 MED ORDER — MIDAZOLAM HCL 2 MG/2ML IJ SOLN
INTRAMUSCULAR | Status: AC
  Filled 2014-02-28: qty 6

## 2014-02-28 MED ORDER — MORPHINE SULFATE 4 MG/ML IJ SOLN
4.0000 mg | Freq: Once | INTRAMUSCULAR | Status: AC
Start: 2014-02-28 — End: 2014-02-28
  Administered 2014-02-28: 4 mg via INTRAVENOUS
  Filled 2014-02-28: qty 1

## 2014-02-28 MED ORDER — LEVOFLOXACIN IN D5W 750 MG/150ML IV SOLN
750.0000 mg | INTRAVENOUS | Status: DC
  Administered 2014-02-28 – 2014-03-02 (×2): 750 mg via INTRAVENOUS
  Filled 2014-02-28 (×2): qty 150

## 2014-02-28 MED ORDER — MIDAZOLAM HCL 2 MG/2ML IJ SOLN
INTRAMUSCULAR | Status: AC | PRN
  Administered 2014-02-28: 0.5 mg via INTRAVENOUS

## 2014-02-28 MED ORDER — MORPHINE SULFATE 4 MG/ML IJ SOLN
4.0000 mg | INTRAMUSCULAR | Status: DC | PRN
  Administered 2014-02-28 – 2014-03-02 (×9): 4 mg via INTRAVENOUS
  Filled 2014-02-28 (×9): qty 1

## 2014-02-28 MED ORDER — SODIUM CHLORIDE 0.9 % IV SOLN
INTRAVENOUS | Status: AC
  Administered 2014-02-28 – 2014-03-01 (×3): via INTRAVENOUS

## 2014-02-28 NOTE — Progress Notes (Signed)
Chaplain provided support and prayers with family prior to biopsy.   Pt's sister, husband communicated fears around results, as several relatives have died from cancer.   Will continue to follow for support needs.   Please page if condition changes or specific needs arise.   Jerene Pitch [employer] [place]

## 2014-02-28 NOTE — Progress Notes (Signed)
Came to visit patient at bedside to offer [place] [facility] services. However, she was off the unit. Will come back at later time. Spoke with inpatient RN case manager to make aware that writer is following for potential [place] [employer] services.  Marthenia Rolling, MSN- RN,BSN- [facility] [id number]

## 2014-02-28 NOTE — Consult Note (Signed)
HPI: [name] is an 73 y.o. female with c/o severe body pain and pancytopenia. There is concern of a systemic/disseminated malignancy. Oncology has requested a bone marrow biopsy as part of her workup. Chart, PMHX, meds reviewed. She presently c/o generalized pain, but denies CP, SOB, palpitations. She has been NPO this am.  Past Medical History:  Past Medical History  Diagnosis Date  . DIABETES MELLITUS, TYPE II 07/13/2007  . HYPERLIPIDEMIA 12/24/2007  . HYPERTENSION 07/13/2007  . CORONARY ARTERY DISEASE 12/24/2007  . NEPHROLITHIASIS, HX OF 12/24/2007  . Colon polyps 04/29/2010    TUBULAR ADENOMA AND A SERRATED ADENOMA  . Acute pancreatitis   . Unspecified disease of pancreas   . Chronic kidney disease, stage II (mild)     Past Surgical History:  Past Surgical History  Procedure Laterality Date  . Excisional hemorrhoidectomy  unknown  . Abdominal hysterectomy  unknown  . Nasal sinus surgery  unknown  . Cardiac catheterization  2002  . Cataract extraction Bilateral     Family History:  Family History  Problem Relation Age of Onset  . Cancer Father     throat ca  . COPD Father   . Cancer Sister     uterine ca and breast ca  . Cancer Brother     throat    Social History:  reports that she quit smoking about 13 years ago. Her smoking use included Cigarettes. She smoked 0.00 packs per day. She has never used smokeless tobacco. She reports that she does not drink alcohol or use illicit drugs.  Allergies: No Known Allergies  Medications:   Medication List    ASK your doctor about these medications       amLODipine 5 MG tablet  Commonly known as:  NORVASC  Take 5 mg by mouth daily.     aspirin 325 MG tablet  Take 325 mg by mouth daily.     glimepiride 4 MG tablet  Commonly known as:  AMARYL  Take 4 mg by mouth daily before breakfast.     HYDROcodone-acetaminophen 5-325 MG per tablet  Commonly known as:  NORCO/VICODIN  Take 1 tablet by mouth every 6 (six)  hours as needed for moderate pain or severe pain.     LORazepam 0.5 MG tablet  Commonly known as:  ATIVAN  Take 1 tablet (0.5 mg total) by mouth every 6 (six) hours as needed for anxiety.     meloxicam 7.5 MG tablet  Commonly known as:  MOBIC  Take 1 tablet (7.5 mg total) by mouth daily.     metFORMIN 1000 MG tablet  Commonly known as:  GLUCOPHAGE  Take 1 tablet (1,000 mg total) by mouth 2 (two) times daily with a meal.     metoprolol tartrate 25 MG tablet  Commonly known as:  LOPRESSOR  Take metoprolol 12.5 mg (split 3m tab to half) oral BID (twice a day).     nitroGLYCERIN 0.4 MG SL tablet  Commonly known as:  NITROSTAT  Place 1 tablet (0.4 mg total) under the tongue every 5 (five) minutes as needed for chest pain.     oxyCODONE-acetaminophen 7.5-325 MG per tablet  Commonly known as:  PERCOCET  Take 1 tablet by mouth every 4 (four) hours as needed for pain.     pantoprazole 40 MG tablet  Commonly known as:  PROTONIX  Take 1 tablet (40 mg total) by mouth daily.     promethazine 12.5 MG tablet  Commonly known as:  PHENERGAN  Take 2 tablets (25 mg total) by mouth every 6 (six) hours as needed for nausea.     simvastatin 20 MG tablet  Commonly known as:  ZOCOR  Take 20 mg by mouth every evening.        Please HPI for pertinent positives, otherwise complete 10 system ROS negative.  Physical Exam: BP 141/59  Pulse 100  Temp(Src) 99 F (37.2 C) (Oral)  Resp 16  Ht _0  (1.575 m)  Wt 181 lb 3.5 oz (82.2 kg)  BMI 33.14 kg/m2  SpO2 93% Body mass index is 33.14 kg/(m^2).   General Appearance:  Alert, cooperative, no distress, appears stated age  Head:  Normocephalic, without obvious abnormality, atraumatic  ENT: Unremarkable  Neck: Supple, symmetrical, trachea midline  Lungs:   Clear to auscultation bilaterally, no w/r/r.  Chest Wall:  No tenderness or deformity  Heart:  Regular rate and rhythm, S1, S2 normal, no murmur, rub or gallop.  Neurologic: Normal  affect, no gross deficits.   Results for orders placed during the hospital encounter of 02/23/14 (from the past 48 hour(s))  GLUCOSE, CAPILLARY     Status: Abnormal   Collection Time    02/26/14 11:51 AM      Result Value Ref Range   Glucose-Capillary 161 (*) 70 - 99 mg/dL  GLUCOSE, CAPILLARY     Status: Abnormal   Collection Time    02/26/14  5:20 PM      Result Value Ref Range   Glucose-Capillary 166 (*) 70 - 99 mg/dL  GLUCOSE, CAPILLARY     Status: Abnormal   Collection Time    02/26/14  9:19 PM      Result Value Ref Range   Glucose-Capillary 255 (*) 70 - 99 mg/dL   Comment 1 Notify RN    GLUCOSE, CAPILLARY     Status: Abnormal   Collection Time    02/27/14  8:03 AM      Result Value Ref Range   Glucose-Capillary 228 (*) 70 - 99 mg/dL   Comment 1 Documented in Chart     Comment 2 Notify RN    BASIC METABOLIC PANEL     Status: Abnormal   Collection Time    02/27/14 10:15 AM      Result Value Ref Range   Sodium 129 (*) 137 - 147 mEq/L   Potassium 5.0  3.7 - 5.3 mEq/L   Chloride 94 (*) 96 - 112 mEq/L   CO2 19  19 - 32 mEq/L   Glucose, Bld 299 (*) 70 - 99 mg/dL   BUN 41 (*) 6 - 23 mg/dL   Creatinine, Ser 1.72 (*) 0.50 - 1.10 mg/dL   Calcium 8.6  8.4 - 10.5 mg/dL   GFR calc non Af Amer 29 (*) >90 mL/min   GFR calc Af Amer 33 (*) >90 mL/min   Comment: (NOTE)     The eGFR has been calculated using the CKD EPI equation.     This calculation has not been validated in all clinical situations.     eGFR's persistently <90 mL/min signify possible Chronic Kidney     Disease.  CBC WITH DIFFERENTIAL     Status: Abnormal   Collection Time    02/27/14 10:15 AM      Result Value Ref Range   WBC 3.6 (*) 4.0 - 10.5 K/uL   RBC 3.51 (*) 3.87 - 5.11 MIL/uL   Hemoglobin 8.8 (*) 12.0 - 15.0 g/dL   HCT 26.7 (*) 36.0 -  46.0 %   MCV 76.1 (*) 78.0 - 100.0 fL   MCH 25.1 (*) 26.0 - 34.0 pg   MCHC 33.0  30.0 - 36.0 g/dL   RDW 16.7 (*) 11.5 - 15.5 %   Platelets 39 (*) 150 - 400 K/uL    Comment: REPEATED TO VERIFY     SPECIMEN CHECKED FOR CLOTS     PLATELET COUNT CONFIRMED BY SMEAR   Neutrophils Relative % 57  43 - 77 %   Lymphocytes Relative 33  12 - 46 %   Monocytes Relative 7  3 - 12 %   Eosinophils Relative 1  0 - 5 %   Basophils Relative 2 (*) 0 - 1 %   Neutro Abs 2.0  1.7 - 7.7 K/uL   Lymphs Abs 1.2  0.7 - 4.0 K/uL   Monocytes Absolute 0.3  0.1 - 1.0 K/uL   Eosinophils Absolute 0.0  0.0 - 0.7 K/uL   Basophils Absolute 0.1  0.0 - 0.1 K/uL   WBC Morphology MILD LEFT SHIFT (1-5% METAS, OCC MYELO, OCC BANDS)     Smear Review PLATELET COUNT CONFIRMED BY SMEAR    HEMOGLOBIN A1C     Status: Abnormal   Collection Time    02/27/14 10:15 AM      Result Value Ref Range   Hemoglobin A1C 8.5 (*) <5.7 %   Comment: (NOTE)                                                                               According to the ADA Clinical Practice Recommendations for 2011, when     HbA1c is used as a screening test:      >=6.5%   Diagnostic of Diabetes Mellitus               (if abnormal result is confirmed)     5.7-6.4%   Increased risk of developing Diabetes Mellitus     References:Diagnosis and Classification of Diabetes Mellitus,Diabetes     XTGG,2694,85(IOEVO 1):S62-S69 and Standards of Medical Care in             Diabetes - 2011,Diabetes Care,2011,34 (Suppl 1):S11-S61.   Mean Plasma Glucose 197 (*) <117 mg/dL   Comment: Performed at [facility], CAPILLARY     Status: Abnormal   Collection Time    02/27/14 12:04 PM      Result Value Ref Range   Glucose-Capillary 215 (*) 70 - 99 mg/dL   Comment 1 Documented in Chart     Comment 2 Notify RN    GLUCOSE, CAPILLARY     Status: Abnormal   Collection Time    02/27/14  5:41 PM      Result Value Ref Range   Glucose-Capillary 222 (*) 70 - 99 mg/dL  GLUCOSE, CAPILLARY     Status: Abnormal   Collection Time    02/27/14  9:49 PM      Result Value Ref Range   Glucose-Capillary 186 (*) 70 - 99 mg/dL   Comment 1  Notify RN    PROCALCITONIN     Status: None   Collection Time    02/28/14  3:30 AM  Result Value Ref Range   Procalcitonin 3.15     Comment:            Interpretation:     PCT > 2 ng/mL:     Systemic infection (sepsis) is likely,     unless other causes are known.     (NOTE)             ICU PCT Algorithm               Non ICU PCT Algorithm        ----------------------------     ------------------------------             PCT < 0.25 ng/mL                 PCT < 0.1 ng/mL         Stopping of antibiotics            Stopping of antibiotics           strongly encouraged.               strongly encouraged.        ----------------------------     ------------------------------           PCT level decrease by               PCT < 0.25 ng/mL           >= 80% from peak PCT           OR PCT 0.25 - 0.5 ng/mL          Stopping of antibiotics                                                 encouraged.         Stopping of antibiotics               encouraged.        ----------------------------     ------------------------------           PCT level decrease by              PCT >= 0.25 ng/mL           < 80% from peak PCT            AND PCT >= 0.5 ng/mL            Continuing antibiotics                                                  encouraged.           Continuing antibiotics                encouraged.        ----------------------------     ------------------------------         PCT level increase compared          PCT > 0.5 ng/mL             with peak PCT AND              PCT >= 0.5 ng/mL  Escalation of antibiotics                                              strongly encouraged.          Escalation of antibiotics            strongly encouraged.  GLUCOSE, CAPILLARY     Status: Abnormal   Collection Time    02/28/14  7:39 AM      Result Value Ref Range   Glucose-Capillary 222 (*) 70 - 99 mg/dL   Comment 1 Documented in Chart     Comment 2 Notify RN     Dg Bone Survey  Met  02/27/2014   CLINICAL DATA:  Unusual findings on MRI. Question diffuse metastatic lymphoma.  EXAM: METASTATIC BONE SURVEY  COMPARISON:  None.  FINDINGS: Skull: Hyperostosis frontalis. No focal lytic lesion or acute bony abnormality.  Cervical spine: Mild degenerative disc and facet disease. Normal alignment. Prevertebral soft tissues are normal. No visualized focal lesion or acute bony abnormality.  Thoracic spine: Degenerative spurring throughout the mid and lower thoracic spine. No focal lytic lesion or acute bony abnormality.  Lumbar spine: Anterior degenerative spurring. Normal alignment. No focal lytic lesion or acute bony abnormality.  Pelvis: Contrast material within the right-sided colon obscures the right iliac bone. Mild degenerative changes in the hips bilaterally which are symmetric. No focal lytic lesion or acute bony abnormality.  Bilateral femurs:  No focal lytic lesion or acute bony abnormality.  Bilateral humeri/ shoulders: Degenerative changes in the [facility] joints bilaterally. No focal lytic lesion or acute bony abnormality.  Chest: Heart is borderline enlarged. Lungs are clear. No effusions or acute bony abnormality. No visible rib abnormality.  IMPRESSION: No focal osseous abnormality to correspond to the abnormalities seen on MRI.   Electronically Signed   By: Rolm Baptise M.D.   On: 02/27/2014 07:31    Assessment/Plan Pancytopenia Widespread bone infarction on MRI Plan for CT guided bone marrow biopsy Procedure explained, including risks, complications, use of sedation to patient and family. Labs reviewed. Consent signed in chart   Ascencion Dike PA-C 02/28/2014, 8:58 AM

## 2014-02-28 NOTE — Progress Notes (Signed)
ANTIBIOTIC CONSULT NOTE - INITIAL  Pharmacy Consult for Renally adjust antibiotics (Levaquin) Indication: r/o Pna  No Known Allergies  Patient Measurements: Height: 5\' 2"  (157.5 cm) Weight: 181 lb 3.5 oz (82.2 kg) IBW/kg (Calculated) : 50.1  Vital Signs: Temp: 98.5 F (36.9 C) (03/24 1413) Temp src: Oral (03/24 1413) BP: 132/54 mmHg (03/24 1413) Pulse Rate: 87 (03/24 1413)  Labs:  Recent Labs  02/27/14 1015  WBC 3.6*  HGB 8.8*  PLT 39*  CREATININE 1.72*   Estimated Creatinine Clearance: 29.4 ml/min (by C-G formula based on Cr of 1.72).   Microbiology: Recent Results (from the past 720 hour(s))  CULTURE, BLOOD (ROUTINE X 2)     Status: None   Collection Time    02/23/14  6:03 PM      Result Value Ref Range Status   Specimen Description BLOOD ARM LEFT   Final   Special Requests BOTTLES DRAWN AEROBIC AND ANAEROBIC 5CC   Final   Culture  Setup Time     Final   Value: 02/24/2014 00:44     Performed at [facility]   Culture     Final   Value:        BLOOD CULTURE RECEIVED NO GROWTH TO DATE CULTURE WILL BE HELD FOR 5 DAYS BEFORE ISSUING A FINAL NEGATIVE REPORT     Performed at [facility]   Report Status PENDING   Incomplete  CULTURE, BLOOD (ROUTINE X 2)     Status: None   Collection Time    02/23/14  6:10 PM      Result Value Ref Range Status   Specimen Description BLOOD HAND LEFT   Final   Special Requests BOTTLES DRAWN AEROBIC AND ANAEROBIC 10CC   Final   Culture  Setup Time     Final   Value: 02/24/2014 00:45     Performed at [facility]   Culture     Final   Value:        BLOOD CULTURE RECEIVED NO GROWTH TO DATE CULTURE WILL BE HELD FOR 5 DAYS BEFORE ISSUING A FINAL NEGATIVE REPORT     Performed at [facility]   Report Status PENDING   Incomplete  URINE CULTURE     Status: None   Collection Time    02/23/14  7:40 PM      Result Value Ref Range Status   Specimen Description URINE, CLEAN CATCH   Final   Special Requests  NONE   Final   Culture  Setup Time     Final   Value: 02/24/2014 00:50     Performed at [place]     Final   Value: 75,000 COLONIES/ML     Performed at [facility]   Culture     Final   Value: Multiple bacterial morphotypes present, none predominant. Suggest appropriate recollection if clinically indicated.     Performed at [facility]   Report Status 02/24/2014 FINAL   Final  MRSA PCR SCREENING     Status: None   Collection Time    02/24/14  3:00 AM      Result Value Ref Range Status   MRSA by PCR NEGATIVE  NEGATIVE Final   Comment:            The GeneXpert MRSA Assay (FDA     approved for NASAL specimens     only), is one component of a  comprehensive MRSA colonization     surveillance program. It is not     intended to diagnose MRSA     infection nor to guide or     monitor treatment for     MRSA infections.    Medical History: Past Medical History  Diagnosis Date  . DIABETES MELLITUS, TYPE II 07/13/2007  . HYPERLIPIDEMIA 12/24/2007  . HYPERTENSION 07/13/2007  . CORONARY ARTERY DISEASE 12/24/2007  . NEPHROLITHIASIS, HX OF 12/24/2007  . Colon polyps 04/29/2010    TUBULAR ADENOMA AND A SERRATED ADENOMA  . Acute pancreatitis   . Unspecified disease of pancreas   . Chronic kidney disease, stage II (mild)     Anti-infectives:  3/19 >> Zosyn x1 3/20 >> vanc >> 3/21 3/20 >> cefepime >>  3/21 3/24 >> Levaquin >>  Assessment: 28 yoF admitted [facility]/20 for increasing low back pain and abdominal pain with fever, transferred to [facility] on 3/22 for oncology w/u.  Initially pt was on broad spectrum antibiotics for r/o sepsis, but were d/c on 3/21.  Pharmacy is now consulted to renally adjust Levaquin, added for fever and r/o pneumonia.  Tm 100.4  WBC 3.6  CrCl ~ 29   Goal of Therapy:  Appropriate abx dosing, eradication of infection.   Plan:   Levaquin 750mg  IV q48h  Pharmacy will sign off note writing, but will follow  peripherally.  Gretta Arab PharmD, BCPS Pager [phone] 02/28/2014 5:32 PM

## 2014-02-28 NOTE — Procedures (Signed)
CT guided bone marrow biopsy.  Unable to get aspirate material.  As a result, 5 core biopsies were performed and yielded soft tan colored material.  No immediate complication.

## 2014-02-28 NOTE — Progress Notes (Addendum)
Clinical Social Work Department BRIEF PSYCHOSOCIAL ASSESSMENT 02/28/2014  Patient:  [name], [name]     Account Number:  [id number]     Admit date:  02/23/2014  Clinical Social Worker:  Ulyess Blossom  Date/Time:  02/28/2014 11:30 AM  Referred by:  RN  Date Referred:  02/28/2014 Referred for  Advanced Directives   Other Referral:   Interview type:  Patient Other interview type:   and patient family at bedside    PSYCHOSOCIAL DATA Living Status:  [place] Admitted from facility:   Level of care:   Primary support name:  Gwyndolyn Saxon Morren/spouse/[phone] Primary support relationship to patient:  SPOUSE Degree of support available:   strong    CURRENT CONCERNS Current Concerns  Other - See comment   Other Concerns:   Advanced Directive    SOCIAL WORK ASSESSMENT / PLAN CSW received referral for Advanced Directive.    RN notified CSW that pt wishing to complete advanced directive before biopsy today.    CSW met with pt and pt family at bedside. Pt had completed document and CSW reviewed document with pt to confirm pt wishes.    CSW arranged two witnesses and notary.    Advanced Directive document notarized and original and two copies provided to pt. Copy placed in permanent medical record.    Pt and pt family appreciative of assistance with completion of packet as pt was very anxious about having advanced directives completed prior to biopsy.    CSW to continue to follow to provide support and assist with disposition planning as appropriate.   Assessment/plan status:  Psychosocial Support/Ongoing Assessment of Needs Other assessment/ plan:   Information/referral to community resources:   Ecologist    PATIENT'S/FAMILY'S RESPONSE TO PLAN OF CARE: Pt alert and oriented x 4. Pt family appears anxious about upcoming biopsy and support provided. Advanced Directive packet completed which appeared to ease some nerves in terms of having the document in  place prior to biopsy.    Alison Murray, MSW, [place] Work [phone]

## 2014-02-28 NOTE — Progress Notes (Addendum)
[facility] PROGRESS NOTE  LINDIE ROBERSON TKP:[id number] DOB: Jun 14, 1941 DOA: 02/23/2014 PCP: [name], MD  Brief narrative: 73 year old female with past medical history of diabetes, atrial fibrillation and mild chronic kidney disease who presented to [facility] ED 02/23/2014 with complaints of neck, back and abdominal pain worsening over past 2-3 weeks prior to this admission. On admission she had a fever of 102, elevated lactic acid and looked clinically dehydrated. Her CT abdomen revealed cystic changes in pancreas similar to prior study (done during recent hospitalization) and slight hepatic irregularity suggestive of possible cirrhosis. Additional evaluation revealed marked elevation in ALP thought to be from bone resorption likely from malignant process. MRI cervical and thoracic spine revealed possible widespread bone infarction. Per oncology, skeletal survey was recommended for further evaluation but skeletal survey did not show focal osseous abnormalities to correspond to the findings on MRI. Oncology further recommended bone marrow biopsy.   Assessment/Plan:   Principal Problem:  Bone pain, back and cervical spine pain in the setting of markedly elevated ALP  Patient transferred from [facility] to [facility] 3/21 for further evaluation of possible widespread bone infarction/T-cell lymphoma. These findings were seen on MRI thoracic and cervical spine. Patient had skeletal survey done as well but there were no focal osseous abnormalities to correspond to the findings on MRI. I asked oncology to give [place] an input on further evaluation. Not sure how to explain high ALP, high LDH and pancytopenia. GGT is WNL. Per oncology this is likely a malignancy for which reason order was placed for bone marrow biopsy. This was done today 3/24, dry aspirate so 5 core biopsies were done.  IFE results significant for free kappa and lambda chains, detected gamma globulin. SPEP was significant for low total  protein and albumin and high alpha 1 and alpha 2 globulin. Ferritin was 2727, LDH 1877.  Pain management waswith dilaudid 2 mg every 2 hours IV PRN but her pain was largely uncontrolled so we have changed it to morphine 4 mg IV every 2 hours IV PRN and pain seems to be better controlled with this med. Patient is also on oxycodone 15 mg PO every 12 hours.  Active Problems:  Fever Patient spiked fever overnight. We will obtain blood cultures, urinalysis, urine culture and CXR. She had a CXR done on admission showed questionable left lung base opacity but has not been treated with antibiotics on admission. Due to concern for possible pneumonia in this immunocompromised patient I think it is reasonable to start antibiotic and will treat empirically for pneumonia with Levaquin. We will narrow down as results of above tests are available. Her procalcitonin level was mildly elevate at 3.15 on 3/24. Of note, patient was given 1 dose of zosyn and vanco in ED on admission and 1 dose of cefepime for possible sepsis. Those antibiotics were not continued from ED. Liver cirrhosis, NASH  Has been seen by GI in the office on 02/09/2014 and patient was thought to have liver cirrhosis secondary to NASH. Patient had an extensive serological evaluation during previous hospital stay and the evaluation included ANA, AMA, anti-smooth muscle antibody, hepatitis B surface antigen and antibody, and hepatitis C antibody were all negative/normal. Patient denies ETOH use. CA 19.9 was 9.7. GI consulted and thought her abdominal pain is not related to liver cirrhosis.  DIABETES MELLITUS, TYPE II  CBG's in past 24 hours: 186, 222, 191. Diabetic coordinator recommended 10 units of Lantus daily. We will continue sliding scale insulin as well. A1c is 8.5  indicating poor glycemic control.  HYPERLIPIDEMIA  Continue statin therapy  HYPERTENSION  Continue Norvasc and metoprolol  Hyponatremia Secondary to dehydration. Sodium 129 on 3/23. Has  received IV fluids on admission and due to poor PO intake we will restart IV fluids today. Check BMP in am.  CAD, non obstructive in '02  Continue aspirin  CKD (chronic kidney disease), stage III  In 01/2014 creatinine was around 1.5 and on this admission as high as 1.7. We will continue to monitor renal function.  H/O atrial fibrillation  Rate controlled with metoprolol. Not on anticoagulation; she is on aspirin daily, stopped due to low platelet count Pancytopenia  Likely related to malignancy. IFE results significant for free kappa and lambda chains, detected gamma globulin. SPEP was significant for low total protein and albumin and high alpha 1 and alpha 2 globulin. Ferritin was 2727, LDH 1877. Appreciate oncology assistance. Blood work on 3/23 showed WBC count of 3.6, hemoglobin of 8.8 and platelet count of 39. No signs of acute bleed. No current indications for transfusion. We will continue to monitor CBC daily.  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 One of the polyps was a tubular adenoma and the other was a serrated adenoma with no evidence of high-grade dysplasia.  Severe protein calorie malnutrition Secondary to extensive pain, possible malignancy. Nutrition consulted.   Code Status: full code  Family Communication: family at the bedside  Disposition Plan: home when stable   Consultants:  Oncology  Gastroenterology  Interventional radiology  Procedures:  CT guided biopsy 3/24  Antibiotics:  Vanco, zosyn, cefepime on 3/19 Levaquin 02/28/14 -->  , , MD  [facility] Pager [phone]  If 7PM-7AM, please contact night-coverage [email or web address] Password [email or web address] 02/28/2014, 4:26 PM   LOS: 5 days   HPI/Subjective: In severe pain this am.   Objective: Filed Vitals:   02/28/14 1249 02/28/14  1252 02/28/14 1256 02/28/14 1413  BP: 118/53 103/75 127/81 132/54  Pulse: 86 88 87 87  Temp:    98.5 F (36.9 C)  TempSrc:    Oral  Resp: 14 14 12 18  Height:      Weight:      SpO2: 98% 98% 99% 93%    Intake/Output Summary (Last 24 hours) at 02/28/14 1626 Last data filed at 02/28/14 0900  Gross per 24 hour  Intake      0 ml  Output    200 ml  Net   -200 ml    Exam:   General:  Pt is alert, appears ill, in distress due to pain  Cardiovascular: tachycardic, irregular rhythm, S1/S2 appreciated   Respiratory: Clear to auscultation bilaterally, no wheezing, no crackles  Abdomen: Soft, obese, non tender, non distended, bowel sounds present  Extremities: Pulses DP and PT palpable bilaterally  Neuro: Grossly nonfocal  Data Reviewed: Basic Metabolic Panel:  Recent Labs Lab 02/22/14 0406 02/23/14 1746 02/24/14 0311 02/25/14 0230 02/27/14 1015  NA 136* 133* 138 136* 129*  K 5.3 4.7 5.3 4.4 5.0  CL 96 94* 102 101 94*  CO2 21 19 22 21 19  GLUCOSE 205* 247* 192* 201* 299*  BUN 23 25* 22 19 41*  CREATININE 1.59* 1.79* 1.40* 1.12* 1.72*  CALCIUM 9.0 8.8 8.5 8.6 8.6   Liver Function Tests:  Recent Labs Lab 02/22/14 0406 02/24/14 0040 02/24/14 0311 02/25/14 0230    AST 17 17 21 28  ALT 7 6 6 7  ALKPHOS 741* 790* 805* 1575*  BILITOT 0.5 0.5 0.6 0.4  PROT 7.4 6.4 6.5 6.3  ALBUMIN 3.5 2.8* 2.9* 2.7*    Recent Labs Lab 02/22/14 0540 02/24/14 0040  LIPASE 31 59   No results found for this basename: AMMONIA,  in the last 168 hours CBC:  Recent Labs Lab 02/22/14 0406 02/23/14 1746 02/24/14 0311 02/25/14 0230 02/27/14 1015  WBC 3.3* 2.6* 1.8* 2.4* 3.6*  NEUTROABS 2.0  --  1.1*  --  2.0  HGB 11.3* 11.0* 9.7* 9.4* 8.8*  HCT 35.1* 33.7* 29.9* 29.0* 26.7*  MCV 78.5 77.8* 77.3* 77.3* 76.1*  PLT 91* 85* 59* 52* 39*   Cardiac Enzymes:  Recent Labs Lab 02/24/14 0040 02/24/14 0311 02/24/14 0900 02/24/14 1400  TROPONINI <0.30 <0.30 <0.30 <0.30    BNP: No components found with this basename: POCBNP,  CBG:  Recent Labs Lab 02/27/14 1204 02/27/14 1741 02/27/14 2149 02/28/14 0739 02/28/14 1153  GLUCAP 215* 222* 186* 222* 191*    CULTURE, BLOOD (ROUTINE X 2)     Status: None   Collection Time    02/23/14  6:03 PM      Result Value Ref Range Status   Specimen Description BLOOD ARM LEFT   Final   Value:        BLOOD CULTURE RECEIVED NO GROWTH TO DATE CULTURE WILL BE HELD FOR 5 DAYS BEFORE ISSUING A FINAL NEGATIVE REPORT     Performed at [facility]   Report Status PENDING   Incomplete  CULTURE, BLOOD (ROUTINE X 2)     Status: None   Collection Time    02/23/14  6:10 PM      Result Value Ref Range Status   Specimen Description BLOOD HAND LEFT   Final   Value:        BLOOD CULTURE RECEIVED NO GROWTH TO DATE CULTURE WILL BE HELD FOR 5 DAYS BEFORE ISSUING A FINAL NEGATIVE REPORT     Performed at [facility]   Report Status PENDING   Incomplete  URINE CULTURE     Status: None   Collection Time    02/23/14  7:40 PM      Result Value Ref Range Status   Specimen Description URINE, CLEAN CATCH   Final   Value: Multiple bacterial morphotypes present, none predominant. Suggest appropriate recollection if clinically indicated.     Performed at [facility]   Report Status 02/24/2014 FINAL   Final  MRSA PCR SCREENING     Status: None   Collection Time    02/24/14  3:00 AM      Result Value Ref Range Status   MRSA by PCR NEGATIVE  NEGATIVE Final     Studies: Ct Biopsy 02/28/2014    IMPRESSION: CT guided bone marrow biopsy.     Scheduled Meds: . amLODipine  5 mg Oral Daily  . aspirin  325 mg Oral Daily  . feeding supplement  1 Container Oral TID BM  . fentaNYL      . insulin aspart  0-9 Units Subcutaneous TID WC  . insulin glargine  10 Units Subcutaneous Daily  . metoprolol tartrate  25 mg Oral BID  . midazolam      . OxyCODONE  15 mg Oral Q12H  . pantoprazole  40 mg Oral Daily  . simvastatin   20 mg Oral QPM        

## 2014-03-01 DIAGNOSIS — R933 Abnormal findings on diagnostic imaging of other parts of digestive tract: Secondary | ICD-10-CM

## 2014-03-01 DIAGNOSIS — D72819 Decreased white blood cell count, unspecified: Secondary | ICD-10-CM

## 2014-03-01 DIAGNOSIS — N183 Chronic kidney disease, stage 3 unspecified: Secondary | ICD-10-CM

## 2014-03-01 DIAGNOSIS — R82998 Other abnormal findings in urine: Secondary | ICD-10-CM

## 2014-03-01 LAB — BASIC METABOLIC PANEL
BUN: 67 mg/dL — ABNORMAL HIGH (ref 6–23)
CALCIUM: 8.2 mg/dL — AB (ref 8.4–10.5)
CO2: 17 mEq/L — ABNORMAL LOW (ref 19–32)
Chloride: 98 mEq/L (ref 96–112)
Creatinine, Ser: 2.16 mg/dL — ABNORMAL HIGH (ref 0.50–1.10)
GFR calc Af Amer: 25 mL/min — ABNORMAL LOW (ref >90)
GFR, EST NON AFRICAN AMERICAN: 22 mL/min — AB (ref >90)
GLUCOSE: 192 mg/dL — AB (ref 70–99)
Potassium: 5.2 mEq/L (ref 3.7–5.3)
Sodium: 131 mEq/L — ABNORMAL LOW (ref 137–147)

## 2014-03-01 LAB — URINE CULTURE
COLONY COUNT: NO GROWTH
CULTURE: NO GROWTH

## 2014-03-01 LAB — GLUCOSE, CAPILLARY
GLUCOSE-CAPILLARY: 118 mg/dL — AB (ref 70–99)
GLUCOSE-CAPILLARY: 124 mg/dL — AB (ref 70–99)
Glucose-Capillary: 210 mg/dL — ABNORMAL HIGH (ref 70–99)
Glucose-Capillary: 321 mg/dL — ABNORMAL HIGH (ref 70–99)

## 2014-03-01 LAB — CBC
HCT: 25 % — ABNORMAL LOW (ref 36.0–46.0)
Hemoglobin: 8 g/dL — ABNORMAL LOW (ref 12.0–15.0)
MCH: 24.3 pg — ABNORMAL LOW (ref 26.0–34.0)
MCHC: 32 g/dL (ref 30.0–36.0)
MCV: 76 fL — AB (ref 78.0–100.0)
Platelets: 24 10*3/uL — CL (ref 150–400)
RBC: 3.29 MIL/uL — ABNORMAL LOW (ref 3.87–5.11)
RDW: 16.9 % — ABNORMAL HIGH (ref 11.5–15.5)
WBC: 2.3 10*3/uL — ABNORMAL LOW (ref 4.0–10.5)

## 2014-03-01 MED ORDER — ENSURE PUDDING PO PUDG
1.0000 | ORAL | Status: AC
  Filled 2014-03-01: qty 1

## 2014-03-01 MED ORDER — UNJURY CHICKEN SOUP POWDER
2.0000 [oz_av] | Freq: Two times a day (BID) | ORAL | Status: DC
  Administered 2014-03-01 – 2014-03-02 (×2): 2 [oz_av] via ORAL
  Filled 2014-03-01 (×4): qty 27

## 2014-03-01 NOTE — Progress Notes (Signed)
CRITICAL VALUE ALERT  Critical value received:  Platelet 24  Date of notification:  03/01/14  Time of notification:  0426  Critical value read back:yes  Nurse who received alert:  Lottie Dawson  MD notified (1st page):  Tylene Fantasia  Time of first page:  0430  MD notified (2nd page):  Time of second page:  Responding MD: Tylene Fantasia  Time MD responded: [phone]

## 2014-03-01 NOTE — Progress Notes (Signed)
Occupational Therapy Treatment Patient Details Name: [name] MRN: [id number] DOB: Dec 16, 1940 Today's Date: 03/01/2014    History of present illness pt admittedm 3/19 with c/o  neck and back pain, fever. 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   OT comments  Lengthy conversation with daughter regarding ADL activity for pt.  Daughter did report pt wanted to sit EOB last night and get to [facility]. Educated on safety and appropriate activity. Daughter very open to conversation with OT regarding activity as pt feels able. Explained activity such as getting to [facility] and washing face is good for pt right now to provide time OOB.   Follow Up Recommendations  SNF;Home health OT;Supervision/Assistance - 24 hour    Equipment Recommendations  None recommended by OT (TBD)       Precautions / Restrictions Restrictions Weight Bearing Restrictions: No              Activity Tolerance Patient limited by pain   Patient Left in bed;with call bell/phone within reach;with nursing/sitter in room   Nurse Communication Mobility status         Betsy Pries 03/01/2014, 11:10 AM

## 2014-03-01 NOTE — Progress Notes (Signed)
Inpatient Diabetes Program Recommendations  AACE/ADA: New Consensus Statement on Inpatient Glycemic Control (2013)  Target Ranges:  Prepandial:   less than 140 mg/dL      Peak postprandial:   less than 180 mg/dL (1-2 hours)      Critically ill patients:  140 - 180 mg/dL    Results for CALIANNE, LARUE (MRN [id number]) as of 03/01/2014 08:56  Ref. Range 02/28/2014 07:39 02/28/2014 11:53 02/28/2014 18:42 02/28/2014 21:38  Glucose-Capillary Latest Range: 70-99 mg/dL 222 (H) 191 (H) 159 (H) 166 (H)    Results for DEBERA, STERBA (MRN [id number]) as of 03/01/2014 08:56  Ref. Range 03/01/2014 08:18  Glucose-Capillary Latest Range: 70-99 mg/dL 210 (H)     **Pt s/p bone marrow biopsy yesterday.   **Started on 10 units Lantus daily on 03/23.  Pt still having mild glucose elevations first thing in the morning   MD- Please consider increasing Lantus to 12 units daily    Will follow. Wyn Quaker RN, MSN, CDE Diabetes Coordinator Inpatient Diabetes Program Team Pager: [phone] (8a-10p)

## 2014-03-01 NOTE — Progress Notes (Signed)
Additional Interventions: -Allow one salt packet with meals to encourage PO intake/increase food pallatbility -Consider addition of stool softener/laxative as pt w/out bowel movement in three days; may assist in improving appetite  I have reviewed and agree with nutrition follow up note completed by Avel Peace, DI [facility] Clinical Dietitian [id number]

## 2014-03-01 NOTE — Progress Notes (Signed)
NUTRITION FOLLOW UP  Intervention:   - Continue Resource Breeze TID, each provides 250 kcal, 9 g protein - Recommend 2pm jello snack  - Recommend Unjury chicken broth at lunch and dinner  - Recommend Ensure pudding (chocolate) at 8 pm  - Continue to encourage adequate intake of meals and supplements   Nutrition Dx:   Inadeqaute oral intake related to pain as evidenced by minimal PO intake.   Goal:   Patient will meet >/=90% of estimated nutrition needs   Monitor:   PO intake of meals, supplements, weight, labs, I/Os   Assessment:   Patient with history of atrial fibrillation, CKD, HTN, diabetes, admitted with lower back and abdominal pain with fever. She does have 2 recent admits for pancreatitis with pancreatic cyst. Patient has lost about 20 pounds in the last 7 months, but this is intentional. Pt's daughter reports that pt is not eating much more than a half a piece of toast at breakfast. Pt reports that she is drinking all of her RB supplements, which is 3 times a day. Daughter explained that pt is eating beef broth and likes it. Pt reports that she has no appetite, but is willing to try Unjury chicken broth supplement at meals and daughter asked if pt meals could be chopped for easier chewing. Daughter asked if we could provide jello as a snack and also stated that pt likes pudding and requested that we provide that as well.    Height: Ht Readings from Last 1 Encounters:  02/25/14 5\' 2"  (1.575 m)    Weight Status:   Wt Readings from Last 1 Encounters:  02/27/14 181 lb 3.5 oz (82.2 kg)    Re-estimated needs:  Kcal: 1550-1750  Protein: 85-95 g Fluid: >2.4 L/day   Skin: Intact   Diet Order:  Heart Healthy/ Carb Modified    Intake/Output Summary (Last 24 hours) at 03/01/14 0955 Last data filed at 03/01/14 0414  Gross per 24 hour  Intake    480 ml  Output    550 ml  Net    -70 ml    Last BM: 02/26/14    Labs:   Recent Labs Lab 02/25/14 0230 02/27/14 1015  03/01/14 0315  NA 136* 129* 131*  K 4.4 5.0 5.2  CL 101 94* 98  CO2 21 19 17*  BUN 19 41* 67*  CREATININE 1.12* 1.72* 2.16*  CALCIUM 8.6 8.6 8.2*  GLUCOSE 201* 299* 192*    CBG (last 3)   Recent Labs  02/28/14 1842 02/28/14 2138 03/01/14 0818  GLUCAP 159* 166* 210*    Scheduled Meds: . amLODipine  5 mg Oral Daily  . feeding supplement (RESOURCE BREEZE)  1 Container Oral TID BM  . insulin aspart  0-9 Units Subcutaneous TID WC  . insulin glargine  10 Units Subcutaneous Daily  . levofloxacin (LEVAQUIN) IV  750 mg Intravenous Q48H  . metoprolol tartrate  25 mg Oral BID  . OxyCODONE  15 mg Oral Q12H  . pantoprazole  40 mg Oral Daily  . simvastatin  20 mg Oral QPM  . sodium chloride  3 mL Intravenous Q12H    Continuous Infusions: . sodium chloride 75 mL/hr at 03/01/14 0221    Avel Peace, BS Nutrition Intern

## 2014-03-01 NOTE — Progress Notes (Signed)
PROGRESS NOTE   [name] BPZ:[id number] DOB: 06/07/1941 DOA: 02/23/2014 PCP: [name], MD  Brief narrative: [name] is an 73 y.o. female with past medical history of diabetes, atrial fibrillation and mild chronic kidney disease who presented to [facility] ED 02/23/2014 with complaints of neck, back and abdominal pain worsening over past 2-3 weeks prior to this admission. On admission she had a fever of 102, elevated lactic acid and looked clinically dehydrated. Her CT abdomen revealed cystic changes in pancreas similar to prior study (done during recent hospitalization) and slight hepatic irregularity suggestive of possible cirrhosis. Additional evaluation revealed marked elevation in ALP thought to be from bone resorption likely from malignant process. MRI cervical and thoracic spine revealed possible widespread bone infarction. Per oncology, skeletal survey was recommended for further evaluation but skeletal survey did not show focal osseous abnormalities to correspond to the findings on MRI. Oncology further recommended bone marrow biopsy.    Assessment/Plan: Principal Problem:  Bone pain, back and cervical spine pain in the setting of markedly elevated ALP  Patient transferred from [facility] to [facility] 3/21 for further evaluation of possible widespread bone infarction/T-cell lymphoma. These findings were seen on MRI thoracic and cervical spine. Patient had skeletal survey done as well but there were no focal osseous abnormalities to correspond to the findings on MRI. Status post bone marrow biopsy with final pathology pending, but likely has infiltrative process of the bone marrow. IFE results significant for free kappa and lambda chains, detected gamma globulin. SPEP was significant for low total protein and albumin and high alpha 1 and alpha 2 globulin. Ferritin was 2727, LDH 1877.   Continue pain management with morphine 4 mg IV every 2 hours IV PRN. Patient is also on oxycodone  15 mg PO every 12 hours.  Active Problems:  Fever  Followup blood cultures and urine culture. She is on empiric Levaquin due to her immunosuppressed status although her chest x-rays have been clear without any evidence of pneumonia. Her procalcitonin level was mildly elevated at 3.15 on 3/24.  Of note, patient was given 1 dose of zosyn and vanco in ED on admission and 1 dose of cefepime for possible sepsis. Those antibiotics were not continued from ED.  Liver cirrhosis, NASH  Has been seen by GI in the office on 02/09/2014 and patient was thought to have liver cirrhosis secondary to NASH. Patient had an extensive serological evaluation during previous hospital stay and the evaluation included ANA, AMA, anti-smooth muscle antibody, hepatitis B surface antigen and antibody, and hepatitis C antibody were all negative/normal. Patient denies ETOH use. CA 19.9 was 9.7. GI consulted and felt that her abdominal pain was not related to liver cirrhosis.  DIABETES MELLITUS, TYPE II  Diabetic coordinator recommended 10 units of Lantus daily. We will continue insulin sensitive sliding scale as well. A1c is 8.5 indicating poor glycemic control. CBGs 166-321 over the past 24 hours. HYPERLIPIDEMIA  Continue statin therapy. HYPERTENSION  Continue Norvasc and metoprolol. Hyponatremia  Possibly from SIADH. Mild. CAD, non obstructive in '02  Continue aspirin.  CKD (chronic kidney disease), stage III  In 01/2014 creatinine was around 1.5 and on this admission as high as 1.7. We will continue to monitor renal function.  H/O atrial fibrillation  Rate controlled with metoprolol. Not on anticoagulation; she is on aspirin daily, stopped due to low platelet count  Pancytopenia  Likely secondary to infiltrative malignancy involving the bone marrow. Severe protein calorie malnutrition  Secondary to extensive  pain, possible malignancy. Nutrition consulted.   Code Status: full code  Family Communication: Family including  the daughter and the patient's husband at the bedside. Disposition Plan: Home when stable.   Consultants:  Oncology  Gastroenterology  Interventional radiology  Procedures:  CT guided biopsy 3/24  Antibiotics:  Vanco, zosyn, cefepime on 3/19  Levaquin 02/28/14 -->   HPI/Subjective: [name] is a bit lethargic. Nursing staff reports that there is a significant amount of familial discord with family members fighting in the patient's room. The daughter is attempting to obtain power of attorney and has threatened to bar the patient's husband from visiting. She apparently wants her mother to sign over her property to her. Patient is disengaged and appears depressed. She is having bone pain. No nausea or vomiting. Poor appetite.  Objective: Filed Vitals:   02/28/14 1256 02/28/14 1413 02/28/14 2140 03/01/14 0413  BP: 127/81 132/54 114/60 114/54  Pulse: 87 87 106 87  Temp:  98.5 F (36.9 C) 100 F (37.8 C) 99.1 F (37.3 C)  TempSrc:  Oral Oral Oral  Resp: $Remo'12 18 18 16  'RmyOH$ Height:      Weight:      SpO2: 99% 93% 98% 99%    Intake/Output Summary (Last 24 hours) at 03/01/14 1616 Last data filed at 03/01/14 1435  Gross per 24 hour  Intake    480 ml  Output   1200 ml  Net   -720 ml    Exam: Gen:  NAD Cardiovascular:  RRR, No M/R/G Respiratory:  Lungs CTAB Gastrointestinal:  Abdomen soft, NT/ND, + BS Extremities:  No C/E/C  Data Reviewed: Basic Metabolic Panel:  Recent Labs Lab 02/23/14 1746 02/24/14 0311 02/25/14 0230 02/27/14 1015 03/01/14 0315  NA 133* 138 136* 129* 131*  K 4.7 5.3 4.4 5.0 5.2  CL 94* 102 101 94* 98  CO2 $Re'19 22 21 19 'LOy$ 17*  GLUCOSE 247* 192* 201* 299* 192*  BUN 25* 22 19 41* 67*  CREATININE 1.79* 1.40* 1.12* 1.72* 2.16*  CALCIUM 8.8 8.5 8.6 8.6 8.2*   GFR Estimated Creatinine Clearance: 23.4 ml/min (by C-G formula based on Cr of 2.16). Liver Function Tests:  Recent Labs Lab 02/24/14 0040 02/24/14 0311 02/25/14 0230  AST $Re'17 21 28  'Yds$ ALT $R'6 6  7  'Ps$ ALKPHOS 790* 805* 1575*  BILITOT 0.5 0.6 0.4  PROT 6.4 6.5 6.3  ALBUMIN 2.8* 2.9* 2.7*    Recent Labs Lab 02/24/14 0040  LIPASE 59   CBC:  Recent Labs Lab 02/23/14 1746 02/24/14 0311 02/25/14 0230 02/27/14 1015 03/01/14 0315  WBC 2.6* 1.8* 2.4* 3.6* 2.3*  NEUTROABS  --  1.1*  --  2.0  --   HGB 11.0* 9.7* 9.4* 8.8* 8.0*  HCT 33.7* 29.9* 29.0* 26.7* 25.0*  MCV 77.8* 77.3* 77.3* 76.1* 76.0*  PLT 85* 59* 52* 39* 24*   Cardiac Enzymes:  Recent Labs Lab 02/24/14 0040 02/24/14 0311 02/24/14 0900 02/24/14 1400  TROPONINI <0.30 <0.30 <0.30 <0.30   CBG:  Recent Labs Lab 02/28/14 1153 02/28/14 1842 02/28/14 2138 03/01/14 0818 03/01/14 1132  GLUCAP 191* 159* 166* 210* 321*   Hgb A1c  Recent Labs  02/27/14 1015  HGBA1C 8.5*   Anemia work up  Recent Labs  02/28/14 0330  FERRITIN 2727*   Microbiology Recent Results (from the past 240 hour(s))  CULTURE, BLOOD (ROUTINE X 2)     Status: None   Collection Time    02/23/14  6:03 PM  Result Value Ref Range Status   Specimen Description BLOOD ARM LEFT   Final   Special Requests BOTTLES DRAWN AEROBIC AND ANAEROBIC 5CC   Final   Culture  Setup Time     Final   Value: 02/24/2014 00:44     Performed at [facility]   Culture     Final   Value:        BLOOD CULTURE RECEIVED NO GROWTH TO DATE CULTURE WILL BE HELD FOR 5 DAYS BEFORE ISSUING A FINAL NEGATIVE REPORT     Performed at [facility]   Report Status PENDING   Incomplete  CULTURE, BLOOD (ROUTINE X 2)     Status: None   Collection Time    02/23/14  6:10 PM      Result Value Ref Range Status   Specimen Description BLOOD HAND LEFT   Final   Special Requests BOTTLES DRAWN AEROBIC AND ANAEROBIC 10CC   Final   Culture  Setup Time     Final   Value: 02/24/2014 00:45     Performed at [facility]   Culture     Final   Value:        BLOOD CULTURE RECEIVED NO GROWTH TO DATE CULTURE WILL BE HELD FOR 5 DAYS BEFORE ISSUING A FINAL  NEGATIVE REPORT     Performed at [facility]   Report Status PENDING   Incomplete  URINE CULTURE     Status: None   Collection Time    02/23/14  7:40 PM      Result Value Ref Range Status   Specimen Description URINE, CLEAN CATCH   Final   Special Requests NONE   Final   Culture  Setup Time     Final   Value: 02/24/2014 00:50     Performed at [facility] Count     Final   Value: 75,000 COLONIES/ML     Performed at [facility]   Culture     Final   Value: Multiple bacterial morphotypes present, none predominant. Suggest appropriate recollection if clinically indicated.     Performed at [facility]   Report Status 02/24/2014 FINAL   Final  MRSA PCR SCREENING     Status: None   Collection Time    02/24/14  3:00 AM      Result Value Ref Range Status   MRSA by PCR NEGATIVE  NEGATIVE Final   Comment:            The GeneXpert MRSA Assay (FDA     approved for NASAL specimens     only), is one component of a     comprehensive MRSA colonization     surveillance program. It is not     intended to diagnose MRSA     infection nor to guide or     monitor treatment for     MRSA infections.  CULTURE, BLOOD (ROUTINE X 2)     Status: None   Collection Time    02/28/14  5:00 PM      Result Value Ref Range Status   Specimen Description BLOOD LEFT ARM   Final   Special Requests BOTTLES DRAWN AEROBIC ONLY 3CC   Final   Culture  Setup Time     Final   Value: 02/28/2014 21:35     Performed at [facility]   Culture     Final   Value:  BLOOD CULTURE RECEIVED NO GROWTH TO DATE CULTURE WILL BE HELD FOR 5 DAYS BEFORE ISSUING A FINAL NEGATIVE REPORT     Performed at [facility]   Report Status PENDING   Incomplete  CULTURE, BLOOD (ROUTINE X 2)     Status: None   Collection Time    02/28/14  5:10 PM      Result Value Ref Range Status   Specimen Description BLOOD LEFT HAND   Final   Special Requests BOTTLES DRAWN AEROBIC ONLY  3CC   Final   Culture  Setup Time     Final   Value: 02/28/2014 21:34     Performed at [facility]   Culture     Final   Value:        BLOOD CULTURE RECEIVED NO GROWTH TO DATE CULTURE WILL BE HELD FOR 5 DAYS BEFORE ISSUING A FINAL NEGATIVE REPORT     Performed at [facility]   Report Status PENDING   Incomplete     Procedures and Diagnostic Studies: Ct Abdomen Pelvis Wo Contrast  02/23/2014   CLINICAL DATA:  Low back pain.  EXAM: CT ABDOMEN AND PELVIS WITHOUT CONTRAST  TECHNIQUE: Multidetector CT imaging of the abdomen and pelvis was performed following the standard protocol without intravenous contrast.  COMPARISON:  MRI 01/31/2014.  FINDINGS: No focal hepatic abnormality. Slight hepatic irregularity noted. Cirrhosis could present this fashion. Mild splenomegaly. Splenosis. No biliary distention. Cystic changes in the head of pancreas for better imaged with MRI, represents made to prior MRI report. Calcific densities noted in the pancreas could be related to vascular disease and/or chronic pancreatic inflammatory change. Gallbladder is nondistended. No biliary distention.  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 The otherwise unremarkable. No focal renal abnormalities identified. Renal vascular calcification present. No hydronephrosis or obstructing ureteral stone. Bladder is nondistended.  Hysterectomy. Tiny 1.5 cm left ovarian cyst noted. No free pelvic fluid.  No significant adenopathy. Abdominal aorta is atherosclerotic. No aneurysm. Distal vessels are atherosclerotic.  Appendix normal. No inflammatory changes are present in the right lower quadrant. Terminal ileum appears normal. No inflammatory changes left lower quadrant. No significant bowel distention. No free air. No mesenteric mass lesions noted. Small nodular opacities noted in the region of the gastrohepatic ligament may represent perigastric varices  Lung bases are clear.  Coronary artery disease. Abdominal wall is intact. No significant hernia. Lipoma noted along the distal ileal psoas muscle on the right. No acute bony abnormality. Degenerative changes lumbar spine. Degenerative changes both hips.  IMPRESSION: 1. Slight hepatic irregularity with splenomegaly. These findings suggest the possibility of cirrhosis. Small perigastric varices cannot be excluded.  2. Cystic changes previously noted in the pancreas on prior MRI better demonstrated by MRI and continued follow-up as recommended on prior MRI report.  3. Calcific density pancreas may be related to vascular disease however chronic pancreatitis cannot be excluded.  4.  Small left ovarian cyst.  5.  Coronary artery disease.  .   Electronically Signed   By: Cowan   On: 02/23/2014 23:30   Dg Chest 2 View  02/04/2014   CLINICAL DATA:  Chest pain and shortness of breath. Shoulder and upper back pain.  EXAM: CHEST  2 VIEW  COMPARISON:  Chest radiograph from 01/29/2014  FINDINGS: The lungs are well-aerated and clear. There is no evidence of focal opacification, pleural effusion or pneumothorax.  The heart is borderline normal in size. No acute  osseous abnormalities are seen.  IMPRESSION: No acute cardiopulmonary process seen.   Electronically Signed   By: Garald Balding M.D.   On: 02/04/2014 05:53   Ct Angio Chest Pe W/cm &/or Wo Cm  02/04/2014   CLINICAL DATA:  Left upper chest pain  EXAM: CT ANGIOGRAPHY CHEST WITH CONTRAST  TECHNIQUE: Multidetector CT imaging of the chest was performed using the standard protocol during bolus administration of intravenous contrast. Multiplanar CT image reconstructions and MIPs were obtained to evaluate the vascular anatomy.  CONTRAST:  91mL OMNIPAQUE IOHEXOL 350 MG/ML SOLN  COMPARISON:  Prior radiograph from earlier the same day.  FINDINGS: Visualized thyroid is unremarkable.  No pathologically enlarged mediastinal, hilar, or axillary lymph nodes are identified.  Prominent  atherosclerotic calcification seen within the aortic arch and within the proximal great vessels. Intrathoracic aorta is of normal caliber. Calcifications seen about the aortic valve. Mild cardiomegaly is present. Diffuse 3 vessel coronary artery calcifications present. No pericardial effusion.  Pulmonary arterial tree is well opacified. No filling defect to suggest acute pulmonary embolism. Re-formatted imaging confirms these findings.  The lungs are clear without parenchymal consolidation, pulmonary edema, or pleural effusion. Mild subsegmental atelectasis seen dependently within the lower lobes bilaterally. 4 mm nodule seen within the right upper lobe (series 6, image 36). Additional subpleural 5 mm nodule seen within the right lower lobe (series 6, image 44). No other definite pulmonary nodules identified.  1.1 cm nodule within the medial limb of the left adrenal gland containing macroscopic fat density is most compatible with an adrenal myelolipoma (series 4, image 79). Remainder the visualized upper abdomen is unremarkable.  No acute osseous abnormality. No worrisome lytic or blastic osseous lesions.  1. No CT evidence of acute pulmonary embolism. 2. No other acute abnormality identified within the thorax. 3. Prominent atherosclerotic calcifications within the intrathoracic aorta with diffuse 3 vessel coronary artery calcifications. 4. 4 mm right upper lobe pulmonary nodule with additional 5 mm right lower lobe pulmonary nodule as above, indeterminate. If the patient is at high risk for bronchogenic carcinoma, follow-up chest CT at 6-12 months is recommended. If the patient is at low risk for bronchogenic carcinoma, follow-up chest CT at 12 months is recommended. This recommendation follows the consensus statement: Guidelines for Management of Small Pulmonary Nodules Detected on CT Scans: A Statement from the [place] as published in Radiology 2005;237:395-400. 5. 1.1 cm left adrenal myelolipoma.   Review of the MIP images confirms the above findings.   Electronically Signed   By: Jeannine Boga M.D.   On: 02/04/2014 06:56   Mr Lumbar Spine Wo Contrast  02/23/2014   CLINICAL DATA:  Back pain, concern for osteomyelitis/discitis.  EXAM: MRI LUMBAR SPINE WITHOUT CONTRAST  TECHNIQUE: Multiplanar, multisequence MR imaging was performed. No intravenous contrast was administered.  COMPARISON:  Prior MRI from 01/31/2014 and CT from 01/29/2014.  FINDINGS: For the purposes of this dictation, the lowest well-formed intervertebral disc spaces presumed to be the L5-S1 level, and there presumed to be 5 lumbar type vertebral bodies.  Mild straightening of the normal lumbar lordosis. There is no listhesis. Vertebral body heights are preserved. Degenerative endplate Schmorl's node present at the superior endplate of Z61.  Mild diffuse congenital shortening of the pedicles noted.  Signal intensity within the vertebral body bone marrow is normal. Signal intensity within the spinal cord is within normal limits as well. Conus medullaris terminates at the L1 level. No abnormal STIR signal seen within the intervertebral discs or vertebral bodies  to suggest osteomyelitis/ discitis. No epidural fluid collection. Mildly prominent epidural fat noted within the lower lumbar spine.  At T11-12, seen only on sagittal projection. There is degenerative disc bulging with bilateral facet arthrosis. The bulging disc appears to abut the ventral spinal cord with resultant moderate to severe central canal stenosis. No definite cord signal changes identified. Canal stenosis (series 5, image 6). Mild right foraminal narrowing is present this level as well.  At T12-L1, prominent degenerative endplate spurring seen anteriorly. There is mild disc desiccation without significant disc bulge. No focal disc protrusion or facet arthrosis. No canal or neural foraminal stenosis.  At L1-2, there is degenerative disc desiccation with mild diffuse disc  bulge. Mild bilateral facet hypertrophy. No canal or neural foraminal stenosis.  At L2-3, there is degenerative disc desiccation with associated degenerative endplate changes anteriorly. Mild anterior osteophytic spurring. There is mild disc bulge without focal disc protrusion. Mild bilateral facet hypertrophy and ligamentum flavum thickening. No canal or neural foraminal stenosis.  At L3-4, there is disc desiccation with mild diffuse disc bulge mild bilateral facet arthrosis. There is resultant moderate canal narrowing, largely due to short pedicles. There is no focal disc protrusion, although the bulging disc appears to abut the exiting left L3 nerve root as it courses out of the neural foramen (series 8, image 16). Neural foramina remain widely patent.  At L4-5, there is degenerative disc desiccation with mild-to-moderate bilateral facet hypertrophy and ligamentum flavum thickening. Moderate central canal stenosis present, largely due to short pedicles. There is mild left foraminal narrowing without significant right foraminal stenosis. No focal disc herniation at this level.  At L5-S1, there is degenerative disc desiccation with diffuse disc bulging and mild bilateral facet hypertrophy. A superimposed left foraminal disc protrusion is present, abutting the left L5 nerve root as it courses through the left neural foramen (series 8, image 28). There is moderate left foraminal stenosis. No significant canal or right neural foraminal narrowing. Prominent epidural lipomatosis noted at this level.  Visualized paraspinous musculature is within normal limits. Visualized visceral structures are unremarkable. No retroperitoneal adenopathy.  IMPRESSION: 1. No MRI evidence of osteomyelitis/discitis or other infectious process within the lower back. 2. Right central disc bulge/protrusion at T11-12 with resultant moderate to severe central canal stenosis. No definite cord signal changes identified. 3. Left foraminal disc  protrusion at L5-S1 impinging upon the left L5 nerve root and resulting and moderate left neural foraminal stenosis. 4. Mild degenerative disc bulging and facet arthrosis at L3-4 and L4-5, which superimposed a short pedicles results in moderate canal stenosis. At the L3-4 level, the bulging disc appears to abut the exiting left L3 nerve root as it courses out of the left neural foramen.   Electronically Signed   By: Jeannine Boga M.D.   On: 02/23/2014 22:51   Mr Cervical Spine W Wo Contrast  02/24/2014   CLINICAL DATA:  Severe back pain.  Fever.  EXAM: MRI CERVICAL AND thoracic SPINE WITHOUT AND WITH CONTRAST  TECHNIQUE: Multiplanar and multiecho pulse sequences of the cervical spine, to include the craniocervical junction and cervicothoracic junction, and thoracic spine, were obtained without and with intravenous contrast.  CONTRAST:  51mL MULTIHANCE GADOBENATE DIMEGLUMINE 529 MG/ML IV SOLN  COMPARISON:  Lumbar study done yesterday.  Chest CT 02/04/2014.  FINDINGS: MRI CERVICAL SPINE FINDINGS  Alignment of the spine is normal. There are mild bulging discs at C3-4, C4-5, C5-6 and C6-7. These narrow the ventral subarachnoid space but do not compress the cord.  No cord lesion. Enhancement pattern of the vertebral bodies shows geographic areas of nonenhancement throughout the marrow spaces. This is an unusual pattern that can occur with either bone infarction or can be seen an other hematopoetic malignancies particularly T-cell lymphoma. I do not see enhancement pattern that would allow diagnosis of osteomyelitis or any septic joint.  MRI thoracic SPINE FINDINGS  Alignment is normal. There are disc bulges in the mid and lower thoracic region but no herniation or compression of the neural structures. No abnormal cord signal. There is a small amount of layering pleural fluid bilaterally. Again demonstrated throughout the thoracic region are areas of geographic nonenhancement within the bones. This pattern can be  seen with widespread bone infarction or hematopoetic states particularly T-cell lymphoma. There is no sign of focal enhancement to suggest osteomyelitis or septic joint. I think this marrow pattern also may be affecting the ribs, an this could be a systemic osseous condition.  IMPRESSION: There is no finding to allow diagnosis of focal bone or joint infection. No fracture. No significant disc pathology.  Areas of geographic nonenhancement throughout the vertebral body marrow and probably also affecting the ribs. This pattern can be seen in widespread bone infarction or in hematopoetic diseases, particularly T-cell lymphoma.   Electronically Signed   By: Nelson Chimes M.D.   On: 02/24/2014 20:05   Mr Thoracic Spine W Wo Contrast  02/24/2014   CLINICAL DATA:  Severe back pain.  Fever.  EXAM: MRI CERVICAL AND thoracic SPINE WITHOUT AND WITH CONTRAST  TECHNIQUE: Multiplanar and multiecho pulse sequences of the cervical spine, to include the craniocervical junction and cervicothoracic junction, and thoracic spine, were obtained without and with intravenous contrast.  CONTRAST:  68mL MULTIHANCE GADOBENATE DIMEGLUMINE 529 MG/ML IV SOLN  COMPARISON:  Lumbar study done yesterday.  Chest CT 02/04/2014.  FINDINGS: MRI CERVICAL SPINE FINDINGS  Alignment of the spine is normal. There are mild bulging discs at C3-4, C4-5, C5-6 and C6-7. These narrow the ventral subarachnoid space but do not compress the cord. No cord lesion. Enhancement pattern of the vertebral bodies shows geographic areas of nonenhancement throughout the marrow spaces. This is an unusual pattern that can occur with either bone infarction or can be seen an other hematopoetic malignancies particularly T-cell lymphoma. I do not see enhancement pattern that would allow diagnosis of osteomyelitis or any septic joint.  MRI thoracic SPINE FINDINGS  Alignment is normal. There are disc bulges in the mid and lower thoracic region but no herniation or compression of the  neural structures. No abnormal cord signal. There is a small amount of layering pleural fluid bilaterally. Again demonstrated throughout the thoracic region are areas of geographic nonenhancement within the bones. This pattern can be seen with widespread bone infarction or hematopoetic states particularly T-cell lymphoma. There is no sign of focal enhancement to suggest osteomyelitis or septic joint. I think this marrow pattern also may be affecting the ribs, an this could be a systemic osseous condition.  IMPRESSION: There is no finding to allow diagnosis of focal bone or joint infection. No fracture. No significant disc pathology.  Areas of geographic nonenhancement throughout the vertebral body marrow and probably also affecting the ribs. This pattern can be seen in widespread bone infarction or in hematopoetic diseases, particularly T-cell lymphoma.   Electronically Signed   By: Nelson Chimes M.D.   On: 02/24/2014 20:05   US Abdomen Complete  02/24/2014   CLINICAL DATA:  Abdominal pain.  EXAM: ULTRASOUND ABDOMEN COMPLETE  COMPARISON:  CT abdomen and pelvis 02/23/2014.  FINDINGS: Gallbladder:  No gallstones or wall thickening visualized. No sonographic Murphy sign noted.  Common bile duct:  Diameter: 0.4 cm  Liver:  Nodularity of the liver border is better demonstrated on CT scan. No focal liver lesion or intrahepatic biliary ductal dilatation.  IVC:  No abnormality visualized.  Pancreas:  Visualized portion unremarkable.  Spleen:  Size and appearance within normal limits.  Right Kidney:  Length: 11.3 cm. Echogenicity within normal limits. No mass or hydronephrosis visualized.  Left Kidney:  Length: 11.6 cm. Echogenicity within normal limits. No mass or hydronephrosis visualized.  Abdominal aorta:  No aneurysm visualized.  Other findings:  None.  IMPRESSION: Negative for gallstones.  No acute finding.  Nodularity of the liver border consistent with cirrhosis is better demonstrated on comparison CT scan.    Electronically Signed   By: Inge Rise M.D.   On: 02/24/2014 03:45   Mr 3d Recon At Scanner  01/31/2014   CLINICAL DATA:  One-day history of abdominal pain in the epigastric region radiating to the back.  EXAM: MR 3D RECON AT SCANNER; MR ABDOMEN WO/W CM MRCP  TECHNIQUE: Multiplanar multisequence MR imaging of the abdomen was performed, including heavily T2-weighted images of the biliary and pancreatic ducts. Three-dimensional MR images were rendered by post processing of the original MR data.  CONTRAST:  58mL MULTIHANCE GADOBENATE DIMEGLUMINE 529 MG/ML IV SOLN  COMPARISON:  CT ABD/PELVIS W CM dated 01/29/2014  FINDINGS: Liver has a lobular contour with enlarged caudate lobe suggesting underlying cirrhosis. No evidence of underlying architectural distortion. Portal veins are patent. No evidence ascites. No significant periportal lymphadenopathy. Gallbladder is normal. There is no intrahepatic biliary duct dilatation. The common hepatic duct and common bile duct are normal caliber.  There is a small multilobular cystic lesion in the head of the pancreas which is immediately adjacent to the pancreatic duct. This lesion measures 17 mm x 9 mm by 13 mm (image 21, series 5). No evidence of dilatation of the pancreas hepatic duct upstream or downstream from this lesion. The pancreatic parenchyma otherwise appears normal. The pancreatic duct is normal through the body and tail. No evidence of enhancement of the cystic lesion in the head of the pancreas. No enhancing lesions throughout the body of the pancreas. No enhancing hepatic lesion.  Spleen is mildly enlarged at 580 cubic cm. The splenic vein is patent. There is thickening of the left adrenal gland which has loss of signal intensity on the post phase imaging consistent with a benign adrenal adenoma. Within the left adrenal gland is a rounded 8 mm lesion (image 47, series 1300) consistent with a small myelolipoma. Kidneys are normal. No abdominal adenopathy.   IMPRESSION: 1. Morphologic changes of early cirrhosis. No evidence architectural distortion or significant portal hypertension. 2. Mild splenomegaly. 3. Cystic lesion in the head of the pancreas may well communicate with the pancreatic duct. Differential considerations include IPMT and post inflammatory cystic change. Recommend followup MRI without with contrast in 12 months for re-evaluation. This recommendation follows ACR consensus guidelines: Managing Incidental Findings on Abdominal CT: White Paper of the ACR Incidental Findings Committee. J Am Coll Radiol 2010;7:754-773 4. Benign adenoma and myelolipoma of the left adrenal gland.   Electronically Signed   By: Suzy Bouchard M.D.   On: 01/31/2014 13:00   Nm Myocar Multi W/spect W/wall Motion / Ef  02/05/2014   CLINICAL DATA:  Chest pain, history of diabetes and CAD  EXAM: MYOCARDIAL IMAGING  WITH SPECT (REST AND PHARMACOLOGIC-STRESS)  GATED LEFT VENTRICULAR WALL MOTION STUDY  LEFT VENTRICULAR EJECTION FRACTION  TECHNIQUE: Standard myocardial SPECT imaging was performed after resting intravenous injection of 10 mCi Tc-11m sestamibi. Subsequently, intravenous infusion of Lexiscan was performed under the supervision of the Cardiology staff. At peak effect of the drug, 30 mCi Tc-8m sestamibi was injected intravenously and standard myocardial SPECT imaging was performed. Quantitative gated imaging was also performed to evaluate left ventricular wall motion, and estimate left ventricular ejection fraction.  COMPARISON:  CT ANGIO CHEST W/CM &/OR WO/CM dated 02/04/2014; DG CHEST 2 VIEW dated 02/04/2014  FINDINGS: Review of the rotational raw images demonstrates significant breast and chest wall attenuation on both the provided rest and stress images. Mild GI activity is seen on both the provided rest and stress images. No significant patient motion artifact.  SPECT imaging demonstrates there is a very minimal amount of matched attenuation involving the left  ventricular apex which is without associated wall motion abnormality. No scintigraphic evidence of prior infarction or pharmacologically induced ischemia.  Quantitative gated analysis demonstrates normal wall motion.  The resting left ventricular ejection fraction is 30% with end-diastolic volume of 57 ml and end-systolic volume of 21 ml.  IMPRESSION: 1. No scintigraphic evidence of prior infarction or pharmacologically induced ischemia. 2. Normal wall motion.  Ejection fraction is 63%.   Electronically Signed   By: Sandi Mariscal M.D.   On: 02/05/2014 12:34   Ct Biopsy  02/28/2014   CLINICAL DATA:  73 year old with pancytopenia.  EXAM: CT GUIDED BONE MARROW ASPIRATES AND BIOPSY  Physician: Stephan Minister. Henn, MD  MEDICATIONS: 0.5 mg Versed, 50 mcg fentanyl. A radiology nurse monitored the patient for moderate sedation.  ANESTHESIA/SEDATION: Sedation time: 20 min  PROCEDURE: The procedure was explained to the patient. The risks and benefits of the procedure were discussed and the patient's questions were addressed. Informed consent was obtained from the patient. The patient was placed prone on CT scan. Images of the pelvis were obtained. The right side of back was prepped and draped in sterile fashion. The skin and right posterior iliac bone were anesthetized with 1% lidocaine. 11 gauge bone needle was directed into the right iliac bone with CT guidance. No aspirate material could be obtained. As a result, multiple core biopsies were obtained. A total of 5 core biopsies were performed. Three of the core biopsies yielded soft tan colored material.  FINDINGS: No aspirate could be obtained.  Soft core material was obtained.  COMPLICATIONS: None  IMPRESSION: CT guided bone marrow biopsy.   Electronically Signed   By: Markus Daft M.D.   On: 02/28/2014 14:25   Dg Chest Port 1 View  02/28/2014   CLINICAL DATA:  Fever.  EXAM: PORTABLE CHEST - 1 VIEW  COMPARISON:  CT BIOPSY dated 02/28/2014; CT ANGIO CHEST W/CM &/OR WO/CM dated  02/04/2014; DG CHEST 2 VIEW dated 02/04/2014  FINDINGS: Mediastinum and hilar structures are normal. Borderline cardiomegaly, unchanged. Pulmonary vascularity is normal. No pleural effusion or pneumothorax. No focal pulmonary infiltrate. Questionable pulmonary nodule is noted. This is projected over the anterior right fourth rib. Chest CT is suggested for further evaluation. No acute bony abnormality.  IMPRESSION: Questionable subcentimeter pulmonary nodule periphery of the right mid lung at the level of the anterior right fourth rib. Chest CT suggested for further evaluation.   Electronically Signed   By: Marcello Moores  Register   On: 02/28/2014 18:03   Dg Chest Port 1 View  02/23/2014   CLINICAL  DATA:  Fever.  EXAM: PORTABLE CHEST - 1 VIEW  COMPARISON:  Chest x-ray 01/29/2014 .  FINDINGS: Mediastinum and hilar structures are normal. Heart size upper limits normal, no pulmonary venous congestion. Minimal left base infiltrate cannot be excluded. No pleural effusion or pneumothorax. No acute bony abnormality. Degenerative changes thoracic spine and both shoulders.  IMPRESSION: Cannot exclude a minimal infiltrate in the left lung base.   Electronically Signed   By: Marcello Moores  Register   On: 02/23/2014 18:45   Dg Bone Survey Met  02/27/2014   CLINICAL DATA:  Unusual findings on MRI. Question diffuse metastatic lymphoma.  EXAM: METASTATIC BONE SURVEY  COMPARISON:  None.  FINDINGS: Skull: Hyperostosis frontalis. No focal lytic lesion or acute bony abnormality.  Cervical spine: Mild degenerative disc and facet disease. Normal alignment. Prevertebral soft tissues are normal. No visualized focal lesion or acute bony abnormality.  Thoracic spine: Degenerative spurring throughout the mid and lower thoracic spine. No focal lytic lesion or acute bony abnormality.  Lumbar spine: Anterior degenerative spurring. Normal alignment. No focal lytic lesion or acute bony abnormality.  Pelvis: Contrast material within the right-sided colon  obscures the right iliac bone. Mild degenerative changes in the hips bilaterally which are symmetric. No focal lytic lesion or acute bony abnormality.  Bilateral femurs:  No focal lytic lesion or acute bony abnormality.  Bilateral humeri/ shoulders: Degenerative changes in the [facility] joints bilaterally. No focal lytic lesion or acute bony abnormality.  Chest: Heart is borderline enlarged. Lungs are clear. No effusions or acute bony abnormality. No visible rib abnormality.  IMPRESSION: No focal osseous abnormality to correspond to the abnormalities seen on MRI.   Electronically Signed   By: Rolm Baptise M.D.   On: 02/27/2014 07:31   Mr Jeananne  W/wo Cm/mrcp  01/31/2014   CLINICAL DATA:  One-day history of abdominal pain in the epigastric region radiating to the back.  EXAM: MR 3D RECON AT SCANNER; MR ABDOMEN WO/W CM MRCP  TECHNIQUE: Multiplanar multisequence MR imaging of the abdomen was performed, including heavily T2-weighted images of the biliary and pancreatic ducts. Three-dimensional MR images were rendered by post processing of the original MR data.  CONTRAST:  47mL MULTIHANCE GADOBENATE DIMEGLUMINE 529 MG/ML IV SOLN  COMPARISON:  CT ABD/PELVIS W CM dated 01/29/2014  FINDINGS: Liver has a lobular contour with enlarged caudate lobe suggesting underlying cirrhosis. No evidence of underlying architectural distortion. Portal veins are patent. No evidence ascites. No significant periportal lymphadenopathy. Gallbladder is normal. There is no intrahepatic biliary duct dilatation. The common hepatic duct and common bile duct are normal caliber.  There is a small multilobular cystic lesion in the head of the pancreas which is immediately adjacent to the pancreatic duct. This lesion measures 17 mm x 9 mm by 13 mm (image 21, series 5). No evidence of dilatation of the pancreas hepatic duct upstream or downstream from this lesion. The pancreatic parenchyma otherwise appears normal. The pancreatic duct is normal through the body  and tail. No evidence of enhancement of the cystic lesion in the head of the pancreas. No enhancing lesions throughout the body of the pancreas. No enhancing hepatic lesion.  Spleen is mildly enlarged at 580 cubic cm. The splenic vein is patent. There is thickening of the left adrenal gland which has loss of signal intensity on the post phase imaging consistent with a benign adrenal adenoma. Within the left adrenal gland is a rounded 8 mm lesion (image 47, series 1300) consistent with a small myelolipoma. Kidneys are  normal. No abdominal adenopathy.  IMPRESSION: 1. Morphologic changes of early cirrhosis. No evidence architectural distortion or significant portal hypertension. 2. Mild splenomegaly. 3. Cystic lesion in the head of the pancreas may well communicate with the pancreatic duct. Differential considerations include IPMT and post inflammatory cystic change. Recommend followup MRI without with contrast in 12 months for re-evaluation. This recommendation follows ACR consensus guidelines: Managing Incidental Findings on Abdominal CT: White Paper of the ACR Incidental Findings Committee. J Am Coll Radiol 2010;7:754-773 4. Benign adenoma and myelolipoma of the left adrenal gland.   Electronically Signed   By: Suzy Bouchard M.D.   On: 01/31/2014 13:00    Scheduled Meds: . amLODipine  5 mg Oral Daily  . feeding supplement (ENSURE)  1 Container Oral 1 day or 1 dose  . feeding supplement (RESOURCE BREEZE)  1 Container Oral TID BM  . insulin aspart  0-9 Units Subcutaneous TID WC  . insulin glargine  10 Units Subcutaneous Daily  . levofloxacin (LEVAQUIN) IV  750 mg Intravenous Q48H  . metoprolol tartrate  25 mg Oral BID  . OxyCODONE  15 mg Oral Q12H  . pantoprazole  40 mg Oral Daily  . protein supplement  2 oz Oral q12n4p  . simvastatin  20 mg Oral QPM  . sodium chloride  3 mL Intravenous Q12H   Continuous Infusions: . sodium chloride 75 mL/hr at 03/01/14 1217    Time spent: 35 minutes with > 50%  of time discussing current diagnostic test results, clinical impression and plan of care.    LOS: 6 days   [facility] Pager [phone]. If unable to reach me by pager, please call my cell phone at [phone].  *Please note that the hospitalists switch teams on Wednesdays. Please call the flow manager at [phone] if you are having difficulty reaching the hospitalist taking care of this patient as she can update you and provide the most up-to-date pager number of provider caring for the patient. If 8PM-8AM, please contact night-coverage at [email or web address], password [facility]  03/01/2014, 4:16 PM    **Disclaimer: This note was dictated with voice recognition software. Similar sounding words can inadvertently be transcribed and this note may contain transcription errors which may not have been corrected upon publication of note.**

## 2014-03-02 DIAGNOSIS — C9500 Acute leukemia of unspecified cell type not having achieved remission: Principal | ICD-10-CM

## 2014-03-02 DIAGNOSIS — M549 Dorsalgia, unspecified: Secondary | ICD-10-CM

## 2014-03-02 LAB — BASIC METABOLIC PANEL
BUN: 57 mg/dL — ABNORMAL HIGH (ref 6–23)
CO2: 16 mEq/L — ABNORMAL LOW (ref 19–32)
Calcium: 8.1 mg/dL — ABNORMAL LOW (ref 8.4–10.5)
Chloride: 101 mEq/L (ref 96–112)
Creatinine, Ser: 1.71 mg/dL — ABNORMAL HIGH (ref 0.50–1.10)
GFR calc non Af Amer: 29 mL/min — ABNORMAL LOW (ref >90)
GFR, EST AFRICAN AMERICAN: 33 mL/min — AB (ref >90)
Glucose, Bld: 240 mg/dL — ABNORMAL HIGH (ref 70–99)
POTASSIUM: 4.8 meq/L (ref 3.7–5.3)
Sodium: 132 mEq/L — ABNORMAL LOW (ref 137–147)

## 2014-03-02 LAB — CBC
HEMATOCRIT: 23.9 % — AB (ref 36.0–46.0)
Hemoglobin: 7.8 g/dL — ABNORMAL LOW (ref 12.0–15.0)
MCH: 24.8 pg — ABNORMAL LOW (ref 26.0–34.0)
MCHC: 32.6 g/dL (ref 30.0–36.0)
MCV: 75.9 fL — ABNORMAL LOW (ref 78.0–100.0)
PLATELETS: 22 10*3/uL — AB (ref 150–400)
RBC: 3.15 MIL/uL — ABNORMAL LOW (ref 3.87–5.11)
RDW: 17.1 % — AB (ref 11.5–15.5)
WBC: 2 10*3/uL — AB (ref 4.0–10.5)

## 2014-03-02 LAB — CULTURE, BLOOD (ROUTINE X 2)
Culture: NO GROWTH
Culture: NO GROWTH

## 2014-03-02 LAB — GLUCOSE, CAPILLARY: GLUCOSE-CAPILLARY: 171 mg/dL — AB (ref 70–99)

## 2014-03-02 LAB — BETA-2 MICROGLOBULIN, URINE: Beta-2 Microglobulin, Urine: HIGH ng/mL (ref 0–300)

## 2014-03-02 MED ORDER — OXYCODONE HCL 5 MG PO TABS: 5.0000 mg | ORAL_TABLET | ORAL | Status: DC | PRN

## 2014-03-02 MED ORDER — LEVOFLOXACIN IN D5W 750 MG/150ML IV SOLN: 750.0000 mg | INTRAVENOUS | Status: DC

## 2014-03-02 MED ORDER — SODIUM CHLORIDE 0.9 % IV SOLN: INTRAVENOUS | Status: DC

## 2014-03-02 MED ORDER — INSULIN GLARGINE 100 UNIT/ML ~~LOC~~ SOLN: 10.0000 [IU] | Freq: Every day | SUBCUTANEOUS | Status: DC

## 2014-03-02 MED ORDER — OXYCODONE HCL ER 15 MG PO T12A: 15.0000 mg | EXTENDED_RELEASE_TABLET | Freq: Two times a day (BID) | ORAL | Status: DC

## 2014-03-02 MED ORDER — ONDANSETRON HCL 4 MG/2ML IJ SOLN: 4.0000 mg | Freq: Four times a day (QID) | INTRAMUSCULAR | Status: DC | PRN

## 2014-03-02 MED ORDER — ENSURE PUDDING PO PUDG: 1.0000 | ORAL | Status: DC

## 2014-03-02 MED ORDER — MORPHINE SULFATE 4 MG/ML IJ SOLN: 4.0000 mg | INTRAMUSCULAR | Status: DC | PRN

## 2014-03-02 MED ORDER — ACETAMINOPHEN 325 MG PO TABS: 650.0000 mg | ORAL_TABLET | Freq: Four times a day (QID) | ORAL | Status: DC | PRN

## 2014-03-02 MED ORDER — GUAIFENESIN-DM 100-10 MG/5ML PO SYRP
5.0000 mL | ORAL_SOLUTION | ORAL | Status: DC | PRN
Start: 2014-03-02 — End: 2014-03-03

## 2014-03-02 MED ORDER — POLYETHYLENE GLYCOL 3350 17 G PO PACK: 17.0000 g | PACK | Freq: Every day | ORAL | Status: DC | PRN

## 2014-03-02 MED ORDER — UNJURY CHICKEN SOUP POWDER: 2.0000 [oz_av] | Freq: Two times a day (BID) | ORAL | Status: DC

## 2014-03-02 MED ORDER — SODIUM CHLORIDE 0.9 % IV SOLN
INTRAVENOUS | Status: DC
  Administered 2014-03-02: 1000 mL via INTRAVENOUS
  Administered 2014-03-02: 11:00:00 via INTRAVENOUS

## 2014-03-02 MED ORDER — GUAIFENESIN-DM 100-10 MG/5ML PO SYRP: 5.0000 mL | ORAL_SOLUTION | ORAL | Status: DC | PRN

## 2014-03-02 MED ORDER — ONDANSETRON HCL 4 MG PO TABS: 4.0000 mg | ORAL_TABLET | Freq: Four times a day (QID) | ORAL | Status: DC | PRN

## 2014-03-02 MED ORDER — INSULIN ASPART 100 UNIT/ML ~~LOC~~ SOLN: 0.0000 [IU] | Freq: Three times a day (TID) | SUBCUTANEOUS | Status: DC

## 2014-03-02 MED ORDER — BOOST / RESOURCE BREEZE PO LIQD: 1.0000 | Freq: Three times a day (TID) | ORAL | Status: DC

## 2014-03-02 NOTE — Progress Notes (Signed)
Notified of room availability, C [facility] at [facility],  will call report.

## 2014-03-02 NOTE — Plan of Care (Signed)
Problem: Phase III Progression Outcomes Goal: Voiding independently Outcome: Completed/Met Date Met:  03/02/14 Voiding frequent small amounts

## 2014-03-02 NOTE — Progress Notes (Signed)
IP PROGRESS NOTE  Subjective:   She continues to have pain at the upper and lower back. She reports shoulder pain. She feels "congested" this morning.  Objective: Vital signs in last 24 hours: Blood pressure 128/64, pulse 98, temperature 98.1 F (36.7 C), temperature source Oral, resp. rate 16, height _0  (1.575 m), weight 182 lb 15.7 oz (83 kg), SpO2 96.00%.  Intake/Output from previous day: 03/25 0701 - 03/26 0700 In: 2097.5 [P.O.:310; I.V.:1787.5] Out: 2125 [Urine:2125]  Physical Exam:  HEENT: No thrush or bleeding Lungs: Clear bilaterally, no respiratory distress Cardiac: Regular rate and rhythm Abdomen: No hepatosplenomegaly Extremities: No leg edema Skin: Bone marrow site without bleeding or evidence of infection   Lab Results:  Recent Labs  03/01/14 0315 03/02/14 0325  WBC 2.3* 2.0*  HGB 8.0* 7.8*  HCT 25.0* 23.9*  PLT 24* 22*    BMET  Recent Labs  03/01/14 0315 03/02/14 0325  NA 131* 132*  K 5.2 4.8  CL 98 101  CO2 17* 16*  GLUCOSE 192* 240*  BUN 67* 57*  CREATININE 2.16* 1.71*  CALCIUM 8.2* 8.1*    Studies/Results: Ct Biopsy  02/28/2014   CLINICAL DATA:  73 year old with pancytopenia.  EXAM: CT GUIDED BONE MARROW ASPIRATES AND BIOPSY  Physician: Stephan Minister. Henn, MD  MEDICATIONS: 0.5 mg Versed, 50 mcg fentanyl. A radiology nurse monitored the patient for moderate sedation.  ANESTHESIA/SEDATION: Sedation time: 20 min  PROCEDURE: The procedure was explained to the patient. The risks and benefits of the procedure were discussed and the patient's questions were addressed. Informed consent was obtained from the patient. The patient was placed prone on CT scan. Images of the pelvis were obtained. The right side of back was prepped and draped in sterile fashion. The skin and right posterior iliac bone were anesthetized with 1% lidocaine. 11 gauge bone needle was directed into the right iliac bone with CT guidance. No aspirate material could be obtained. As a  result, multiple core biopsies were obtained. A total of 5 core biopsies were performed. Three of the core biopsies yielded soft tan colored material.  FINDINGS: No aspirate could be obtained.  Soft core material was obtained.  COMPLICATIONS: None  IMPRESSION: CT guided bone marrow biopsy.   Electronically Signed   By: Markus Daft M.D.   On: 02/28/2014 14:25   Dg Chest Port 1 View  02/28/2014   CLINICAL DATA:  Fever.  EXAM: PORTABLE CHEST - 1 VIEW  COMPARISON:  CT BIOPSY dated 02/28/2014; CT ANGIO CHEST W/CM &/OR WO/CM dated 02/04/2014; DG CHEST 2 VIEW dated 02/04/2014  FINDINGS: Mediastinum and hilar structures are normal. Borderline cardiomegaly, unchanged. Pulmonary vascularity is normal. No pleural effusion or pneumothorax. No focal pulmonary infiltrate. Questionable pulmonary nodule is noted. This is projected over the anterior right fourth rib. Chest CT is suggested for further evaluation. No acute bony abnormality.  IMPRESSION: Questionable subcentimeter pulmonary nodule periphery of the right mid lung at the level of the anterior right fourth rib. Chest CT suggested for further evaluation.   Electronically Signed   By: Marcello Moores  Register   On: 02/28/2014 18:03    Medications: I have reviewed the patient's current medications.  Assessment/Plan:  1. Pancytopenia 2. Markedly elevated LDH 3. CT changes of cirrhosis 4. Back pain 5. Cystic pancreas lesion 6. Diabetes 7. Elevated creatinine 8. History of paroxysmal atrial fibrillation/flutter 9. Fever  She has progressive pancytopenia. I discussed the bone marrow findings with Dr. Gari Crown yesterday and again today. He indicates the  bone marrow has significant necrosis with a lack of adequate viable cells to judge morphology. The malignant cells are not marking with the or T-cell markers. 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 He request additional bone marrow material for morphology reviewed and diagnostic  studies.  I contacted Dr. Florene Glen on the acute leukemia service at [facility] We discussed the case. He agrees to accept [name] and transfer. We will arrange for a transfer to the [facility] service today. I will be glad to help in her care in the future as needed.   LOS: 7 days   Michaele Amundson, Dominica Severin  03/02/2014, 11:33 AM

## 2014-03-02 NOTE — Discharge Summary (Signed)
Physician Discharge Summary  [name] MWN:[id number] DOB: Jun 16, 1941 DOA: 02/23/2014  PCP: [name], MD  Admit date: 02/23/2014 Discharge date: 03/02/2014  Recommendations for Outpatient Follow-up:  1. The patient is being transferred to [facility] under the care of Dr. Jerrye Noble.  Discharge Diagnoses:  Principal Problem:    Acute leukemia Active Problems:    DIABETES MELLITUS, TYPE II    HYPERLIPIDEMIA    HYPERTENSION    CAD, non obstructive in '02    CKD (chronic kidney disease), stage III    Lesion of pancreas    Cirrhosis of liver without mention of alcohol    Sepsis    Atrial fibrillation with RVR    Cervical spine pain    Alkaline phosphatase elevation    Leukopenia    Thrombocytopenia    Anemia    Abnormal MRI   Discharge Condition: Stable.  Diet recommendation: Low-sodium, heart healthy, carbohydrate modified.  History of present illness:  [name] is an 73 y.o. female with a PMH of diabetes, atrial fibrillation and mild chronic kidney disease who presented to [facility] ED 02/23/2014 with complaints of neck, back and abdominal pain worsening over past 2-3 weeks prior to admission. On presentation, she had a fever of 102, elevated lactic acid and appeared clinically dehydrated. A CT of the abdomen revealed cystic changes in pancreas similar to prior study (done during recent hospitalization) and slight hepatic irregularity suggestive of possible cirrhosis. Additional evaluation revealed marked elevation in ALP thought to be from a malignant process. MRI cervical and thoracic spine revealed possible widespread bone infarction.    Hospital Course by problem:  Principal Problem:  Probable acute leukemia with bone pain, back and cervical spine pain in the setting of markedly elevated ALP  Patient transferred from [facility] to [facility] 02/25/14 for further evaluation of widespread bone infarction, seen on MRI thoracic and cervical spine. Patient  had skeletal survey done as well but there were no focal osseous abnormalities to correspond to the findings on MRI. Status post bone marrow biopsy with final pathology pending, but likely has infiltrative process of the bone marrow suggestive of acute leukemia. IFE results significant for free kappa and lambda chains, detected gamma globulin. SPEP was significant for low total protein and albumin and high alpha 1 and alpha 2 globulin. Ferritin was 2727, LDH 1877. Dr. [name] has been following the patient and has arranged transfer to [facility] for further evaluation and treatment. Active Problems:  Fever  Blood cultures have been negative to date and urine culture grew 75,000 colonies of multiple morphotypes. She is on empiric Levaquin due to her immunosuppressed status although her chest x-rays have been clear without any evidence of pneumonia. Her procalcitonin level was mildly elevated at 3.15 on 3/24. Of note, patient was given 1 dose of zosyn and vanco in ED on admission and 1 dose of cefepime for possible sepsis. Those antibiotics were not continued from ED.  Liver cirrhosis, NASH  Has been seen by GI in the office on 02/09/2014 and patient was thought to have liver cirrhosis secondary to NASH. Patient had an extensive serological evaluation during previous hospital stay and the evaluation included ANA, AMA, anti-smooth muscle antibody, hepatitis B surface antigen and antibody, and hepatitis C antibody were all negative/normal. Patient denies ETOH use. CA 19.9 was 9.7. GI was consulted and felt that her abdominal pain was not related to liver cirrhosis.  DIABETES MELLITUS, TYPE II  Diabetic coordinator recommended 10 units of Lantus daily. We  will continue insulin sensitive sliding scale as well. A1c is 8.5 indicating poor glycemic control. CBGs 166-321 over the past 24 hours. We'll need further titration of insulin to achieve euglycemia. HYPERLIPIDEMIA  Continue statin therapy.  HYPERTENSION  Continue  Norvasc and metoprolol.  Hyponatremia  Possibly from SIADH. Mild.  CAD, non obstructive in '02  Continue aspirin.  CKD (chronic kidney disease), stage III  In 01/2014 creatinine was around 1.5 and on this admission as high as 1.7. Recommend close monitoring of renal function.  H/O atrial fibrillation  Rate controlled with metoprolol. Not on anticoagulation; she is on aspirin daily, stopped due to low platelet count. Pancytopenia  Likely secondary to infiltrative malignancy involving the bone marrow.  Severe protein calorie malnutrition  Likely cancer related cachexia and significant bone pain contributory. Nutrition consulted. Continue to encourage by mouth intake and provide supplements.   Procedures:  Bone marrow biopsy 02/28/14  Consultations:  Dr. Julieanne Manson, Oncology  Dr. Silvano Rusk, Gastroenterology  Ascencion Dike, PA-C, IR  Discharge Exam: Filed Vitals:   03/02/14 0700  BP: 128/64  Pulse: 98  Temp: 98.1 F (36.7 C)  Resp: 16   Filed Vitals:   03/01/14 2020 03/01/14 2255 03/02/14 0220 03/02/14 0700  BP:  112/60  128/64  Pulse: 84 110 90 98  Temp:  100.6 F (38.1 C) 99 F (37.2 C) 98.1 F (36.7 C)  TempSrc:  Oral Oral Oral  Resp:  16  16  Height:      Weight:    83 kg (182 lb 15.7 oz)  SpO2:  96%  96%    Gen:  NAD Cardiovascular:  RRR, No M/R/G Respiratory: Lungs CTAB Gastrointestinal: Abdomen soft, NT/ND with normal active bowel sounds. Extremities: No C/E/C   Discharge Instructions      Discharge Orders   Future Appointments Provider Department Dept Phone   03/21/2014 11:00 AM Irene Shipper, MD [facility] [phone]   Future Orders Complete By Expires   Call MD for:  persistant nausea and vomiting  As directed    Call MD for:  severe uncontrolled pain  As directed    Call MD for:  temperature >100.4  As directed    Diet - low sodium heart healthy  As directed    Diet Carb Modified  As directed    Increase  activity slowly  As directed        Medication List    STOP taking these medications       aspirin 325 MG tablet     glimepiride 4 MG tablet  Commonly known as:  AMARYL     HYDROcodone-acetaminophen 5-325 MG per tablet  Commonly known as:  NORCO/VICODIN     meloxicam 7.5 MG tablet  Commonly known as:  MOBIC     metFORMIN 1000 MG tablet  Commonly known as:  GLUCOPHAGE     oxyCODONE-acetaminophen 7.5-325 MG per tablet  Commonly known as:  PERCOCET      TAKE these medications       acetaminophen 325 MG tablet  Commonly known as:  TYLENOL  Take 2 tablets (650 mg total) by mouth every 6 (six) hours as needed for mild pain (or Fever >/= 101).     amLODipine 5 MG tablet  Commonly known as:  NORVASC  Take 5 mg by mouth daily.     feeding supplement (ENSURE) Pudg  Take 1 Container by mouth 1 day or 1 dose.     feeding supplement (RESOURCE BREEZE) Liqd  Take 1 Container by mouth 3 (three) times daily between meals.     guaiFENesin-dextromethorphan 100-10 MG/5ML syrup  Commonly known as:  ROBITUSSIN DM  Take 5 mLs by mouth every 4 (four) hours as needed for cough.     insulin aspart 100 UNIT/ML injection  Commonly known as:  novoLOG  Inject 0-9 Units into the skin 3 (three) times daily with meals.     insulin glargine 100 UNIT/ML injection  Commonly known as:  LANTUS  Inject 0.1 mLs (10 Units total) into the skin daily.     levofloxacin 750 MG/150ML Soln  Commonly known as:  LEVAQUIN  Inject 150 mLs (750 mg total) into the vein every other day.     LORazepam 0.5 MG tablet  Commonly known as:  ATIVAN  Take 1 tablet (0.5 mg total) by mouth every 6 (six) hours as needed for anxiety.     metoprolol tartrate 25 MG tablet  Commonly known as:  LOPRESSOR  Take metoprolol 12.5 mg (split $RemoveB'25mg'vmhJBmTA$  tab to half) oral BID (twice a day).     morphine 4 MG/ML injection  Inject 1 mL (4 mg total) into the vein every 2 (two) hours as needed for severe pain.     nitroGLYCERIN 0.4 MG  SL tablet  Commonly known as:  NITROSTAT  Place 1 tablet (0.4 mg total) under the tongue every 5 (five) minutes as needed for chest pain.     ondansetron 4 MG tablet  Commonly known as:  ZOFRAN  Take 1 tablet (4 mg total) by mouth every 6 (six) hours as needed for nausea.     ondansetron 4 MG/2ML Soln injection  Commonly known as:  ZOFRAN  Inject 2 mLs (4 mg total) into the vein every 6 (six) hours as needed for nausea.     oxyCODONE 5 MG immediate release tablet  Commonly known as:  Oxy IR/ROXICODONE  Take 1-2 tablets (5-10 mg total) by mouth every 4 (four) hours as needed for severe pain or breakthrough pain.     OxyCODONE 15 mg T12a 12 hr tablet  Commonly known as:  OXYCONTIN  Take 1 tablet (15 mg total) by mouth every 12 (twelve) hours.     pantoprazole 40 MG tablet  Commonly known as:  PROTONIX  Take 1 tablet (40 mg total) by mouth daily.     polyethylene glycol packet  Commonly known as:  MIRALAX / GLYCOLAX  Take 17 g by mouth daily as needed for moderate constipation.     promethazine 12.5 MG tablet  Commonly known as:  PHENERGAN  Take 2 tablets (25 mg total) by mouth every 6 (six) hours as needed for nausea.     protein supplement Powd  Commonly known as:  UNJURY CHICKEN SOUP  Take 7 g (2 oz total) by mouth 2 times daily at 12 noon and 4 pm.     simvastatin 20 MG tablet  Commonly known as:  ZOCOR  Take 20 mg by mouth every evening.     sodium chloride 0.9 % infusion  $RemoveB'@100'jTZjuhFX$  cc/hr          The results of significant diagnostics from this hospitalization (including imaging, microbiology, ancillary and laboratory) are listed below for reference.    Significant Diagnostic Studies: Ct Abdomen Pelvis Wo Contrast  02/23/2014   CLINICAL DATA:  Low back pain.  EXAM: CT ABDOMEN AND PELVIS WITHOUT CONTRAST  TECHNIQUE: Multidetector CT imaging of the abdomen and pelvis was performed following the standard protocol without intravenous  contrast.  COMPARISON:  MRI  01/31/2014.  FINDINGS: No focal hepatic abnormality. Slight hepatic irregularity noted. Cirrhosis could present this fashion. Mild splenomegaly. Splenosis. No biliary distention. Cystic changes in the head of pancreas for better imaged with MRI, represents made to prior MRI report. Calcific densities noted in the pancreas could be related to vascular disease and/or chronic pancreatic inflammatory change. Gallbladder is nondistended. No biliary distention.  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 The otherwise unremarkable. No focal renal abnormalities identified. Renal vascular calcification present. No hydronephrosis or obstructing ureteral stone. Bladder is nondistended.  Hysterectomy. Tiny 1.5 cm left ovarian cyst noted. No free pelvic fluid.  No significant adenopathy. Abdominal aorta is atherosclerotic. No aneurysm. Distal vessels are atherosclerotic.  Appendix normal. No inflammatory changes are present in the right lower quadrant. Terminal ileum appears normal. No inflammatory changes left lower quadrant. No significant bowel distention. No free air. No mesenteric mass lesions noted. Small nodular opacities noted in the region of the gastrohepatic ligament may represent perigastric varices  Lung bases are clear. Coronary artery disease. Abdominal wall is intact. No significant hernia. Lipoma noted along the distal ileal psoas muscle on the right. No acute bony abnormality. Degenerative changes lumbar spine. Degenerative changes both hips.  IMPRESSION: 1. Slight hepatic irregularity with splenomegaly. These findings suggest the possibility of cirrhosis. Small perigastric varices cannot be excluded.  2. Cystic changes previously noted in the pancreas on prior MRI better demonstrated by MRI and continued follow-up as recommended on prior MRI report.  3. Calcific density pancreas may be related to vascular disease however chronic pancreatitis cannot be excluded.  4.   Small left ovarian cyst.  5.  Coronary artery disease.  .   Electronically Signed   By: [place]   On: 02/23/2014 23:30   Dg Chest 2 View  02/04/2014   CLINICAL DATA:  Chest pain and shortness of breath. Shoulder and upper back pain.  EXAM: CHEST  2 VIEW  COMPARISON:  Chest radiograph from 01/29/2014  FINDINGS: The lungs are well-aerated and clear. There is no evidence of focal opacification, pleural effusion or pneumothorax.  The heart is borderline normal in size. No acute osseous abnormalities are seen.  IMPRESSION: No acute cardiopulmonary process seen.   Electronically Signed   By: Garald Balding M.D.   On: 02/04/2014 05:53   Ct Angio Chest Pe W/cm &/or Wo Cm  02/04/2014   CLINICAL DATA:  Left upper chest pain  EXAM: CT ANGIOGRAPHY CHEST WITH CONTRAST  TECHNIQUE: Multidetector CT imaging of the chest was performed using the standard protocol during bolus administration of intravenous contrast. Multiplanar CT image reconstructions and MIPs were obtained to evaluate the vascular anatomy.  CONTRAST:  54mL OMNIPAQUE IOHEXOL 350 MG/ML SOLN  COMPARISON:  Prior radiograph from earlier the same day.  FINDINGS: Visualized thyroid is unremarkable.  No pathologically enlarged mediastinal, hilar, or axillary lymph nodes are identified.  Prominent atherosclerotic calcification seen within the aortic arch and within the proximal great vessels. Intrathoracic aorta is of normal caliber. Calcifications seen about the aortic valve. Mild cardiomegaly is present. Diffuse 3 vessel coronary artery calcifications present. No pericardial effusion.  Pulmonary arterial tree is well opacified. No filling defect to suggest acute pulmonary embolism. Re-formatted imaging confirms these findings.  The lungs are clear without parenchymal consolidation, pulmonary edema, or pleural effusion. Mild subsegmental atelectasis seen dependently within the lower lobes bilaterally. 4 mm nodule seen within the right upper lobe (series 6,  image 36). Additional subpleural 5 mm nodule seen within the right lower lobe (series 6, image 44). No other definite pulmonary nodules identified.  1.1 cm nodule within the medial limb of the left adrenal gland containing macroscopic fat density is most compatible with an adrenal myelolipoma (series 4, image 79). Remainder the visualized upper abdomen is unremarkable.  No acute osseous abnormality. No worrisome lytic or blastic osseous lesions.  1. No CT evidence of acute pulmonary embolism. 2. No other acute abnormality identified within the thorax. 3. Prominent atherosclerotic calcifications within the intrathoracic aorta with diffuse 3 vessel coronary artery calcifications. 4. 4 mm right upper lobe pulmonary nodule with additional 5 mm right lower lobe pulmonary nodule as above, indeterminate. If the patient is at high risk for bronchogenic carcinoma, follow-up chest CT at 6-12 months is recommended. If the patient is at low risk for bronchogenic carcinoma, follow-up chest CT at 12 months is recommended. This recommendation follows the consensus statement: Guidelines for Management of Small Pulmonary Nodules Detected on CT Scans: A Statement from the Fleischner Society as published in Radiology 2005;237:395-400. 5. 1.1 cm left adrenal myelolipoma.  Review of the MIP images confirms the above findings.   Electronically Signed   By: Rise Mu M.D.   On: 02/04/2014 06:56   Mr Lumbar Spine Wo Contrast  02/23/2014   CLINICAL DATA:  Back pain, concern for osteomyelitis/discitis.  EXAM: MRI LUMBAR SPINE WITHOUT CONTRAST  TECHNIQUE: Multiplanar, multisequence MR imaging was performed. No intravenous contrast was administered.  COMPARISON:  Prior MRI from 01/31/2014 and CT from 01/29/2014.  FINDINGS: For the purposes of this dictation, the lowest well-formed intervertebral disc spaces presumed to be the L5-S1 level, and there presumed to be 5 lumbar type vertebral bodies.  Mild straightening of the normal  lumbar lordosis. There is no listhesis. Vertebral body heights are preserved. Degenerative endplate Schmorl's node present at the superior endplate of T12.  Mild diffuse congenital shortening of the pedicles noted.  Signal intensity within the vertebral body bone marrow is normal. Signal intensity within the spinal cord is within normal limits as well. Conus medullaris terminates at the L1 level. No abnormal STIR signal seen within the intervertebral discs or vertebral bodies to suggest osteomyelitis/ discitis. No epidural fluid collection. Mildly prominent epidural fat noted within the lower lumbar spine.  At T11-12, seen only on sagittal projection. There is degenerative disc bulging with bilateral facet arthrosis. The bulging disc appears to abut the ventral spinal cord with resultant moderate to severe central canal stenosis. No definite cord signal changes identified. Canal stenosis (series 5, image 6). Mild right foraminal narrowing is present this level as well.  At T12-L1, prominent degenerative endplate spurring seen anteriorly. There is mild disc desiccation without significant disc bulge. No focal disc protrusion or facet arthrosis. No canal or neural foraminal stenosis.  At L1-2, there is degenerative disc desiccation with mild diffuse disc bulge. Mild bilateral facet hypertrophy. No canal or neural foraminal stenosis.  At L2-3, there is degenerative disc desiccation with associated degenerative endplate changes anteriorly. Mild anterior osteophytic spurring. There is mild disc bulge without focal disc protrusion. Mild bilateral facet hypertrophy and ligamentum flavum thickening. No canal or neural foraminal stenosis.  At L3-4, there is disc desiccation with mild diffuse disc bulge mild bilateral facet arthrosis. There is resultant moderate canal narrowing, largely due to short pedicles. There is no focal disc protrusion, although the bulging disc appears to abut the exiting left L3 nerve root as it  courses out  of the neural foramen (series 8, image 16). Neural foramina remain widely patent.  At L4-5, there is degenerative disc desiccation with mild-to-moderate bilateral facet hypertrophy and ligamentum flavum thickening. Moderate central canal stenosis present, largely due to short pedicles. There is mild left foraminal narrowing without significant right foraminal stenosis. No focal disc herniation at this level.  At L5-S1, there is degenerative disc desiccation with diffuse disc bulging and mild bilateral facet hypertrophy. A superimposed left foraminal disc protrusion is present, abutting the left L5 nerve root as it courses through the left neural foramen (series 8, image 28). There is moderate left foraminal stenosis. No significant canal or right neural foraminal narrowing. Prominent epidural lipomatosis noted at this level.  Visualized paraspinous musculature is within normal limits. Visualized visceral structures are unremarkable. No retroperitoneal adenopathy.  IMPRESSION: 1. No MRI evidence of osteomyelitis/discitis or other infectious process within the lower back. 2. Right central disc bulge/protrusion at T11-12 with resultant moderate to severe central canal stenosis. No definite cord signal changes identified. 3. Left foraminal disc protrusion at L5-S1 impinging upon the left L5 nerve root and resulting and moderate left neural foraminal stenosis. 4. Mild degenerative disc bulging and facet arthrosis at L3-4 and L4-5, which superimposed a short pedicles results in moderate canal stenosis. At the L3-4 level, the bulging disc appears to abut the exiting left L3 nerve root as it courses out of the left neural foramen.   Electronically Signed   By: Rise Mu M.D.   On: 02/23/2014 22:51   Mr Cervical Spine W Wo Contrast  02/24/2014   CLINICAL DATA:  Severe back pain.  Fever.  EXAM: MRI CERVICAL AND thoracic SPINE WITHOUT AND WITH CONTRAST  TECHNIQUE: Multiplanar and multiecho pulse  sequences of the cervical spine, to include the craniocervical junction and cervicothoracic junction, and thoracic spine, were obtained without and with intravenous contrast.  CONTRAST:  44mL MULTIHANCE GADOBENATE DIMEGLUMINE 529 MG/ML IV SOLN  COMPARISON:  Lumbar study done yesterday.  Chest CT 02/04/2014.  FINDINGS: MRI CERVICAL SPINE FINDINGS  Alignment of the spine is normal. There are mild bulging discs at C3-4, C4-5, C5-6 and C6-7. These narrow the ventral subarachnoid space but do not compress the cord. No cord lesion. Enhancement pattern of the vertebral bodies shows geographic areas of nonenhancement throughout the marrow spaces. This is an unusual pattern that can occur with either bone infarction or can be seen an other hematopoetic malignancies particularly T-cell lymphoma. I do not see enhancement pattern that would allow diagnosis of osteomyelitis or any septic joint.  MRI thoracic SPINE FINDINGS  Alignment is normal. There are disc bulges in the mid and lower thoracic region but no herniation or compression of the neural structures. No abnormal cord signal. There is a small amount of layering pleural fluid bilaterally. Again demonstrated throughout the thoracic region are areas of geographic nonenhancement within the bones. This pattern can be seen with widespread bone infarction or hematopoetic states particularly T-cell lymphoma. There is no sign of focal enhancement to suggest osteomyelitis or septic joint. I think this marrow pattern also may be affecting the ribs, an this could be a systemic osseous condition.  IMPRESSION: There is no finding to allow diagnosis of focal bone or joint infection. No fracture. No significant disc pathology.  Areas of geographic nonenhancement throughout the vertebral body marrow and probably also affecting the ribs. This pattern can be seen in widespread bone infarction or in hematopoetic diseases, particularly T-cell lymphoma.   Electronically Signed   By:  Nelson Chimes M.D.   On: 02/24/2014 20:05   Mr Thoracic Spine W Wo Contrast  02/24/2014   CLINICAL DATA:  Severe back pain.  Fever.  EXAM: MRI CERVICAL AND thoracic SPINE WITHOUT AND WITH CONTRAST  TECHNIQUE: Multiplanar and multiecho pulse sequences of the cervical spine, to include the craniocervical junction and cervicothoracic junction, and thoracic spine, were obtained without and with intravenous contrast.  CONTRAST:  88mL MULTIHANCE GADOBENATE DIMEGLUMINE 529 MG/ML IV SOLN  COMPARISON:  Lumbar study done yesterday.  Chest CT 02/04/2014.  FINDINGS: MRI CERVICAL SPINE FINDINGS  Alignment of the spine is normal. There are mild bulging discs at C3-4, C4-5, C5-6 and C6-7. These narrow the ventral subarachnoid space but do not compress the cord. No cord lesion. Enhancement pattern of the vertebral bodies shows geographic areas of nonenhancement throughout the marrow spaces. This is an unusual pattern that can occur with either bone infarction or can be seen an other hematopoetic malignancies particularly T-cell lymphoma. I do not see enhancement pattern that would allow diagnosis of osteomyelitis or any septic joint.  MRI thoracic SPINE FINDINGS  Alignment is normal. There are disc bulges in the mid and lower thoracic region but no herniation or compression of the neural structures. No abnormal cord signal. There is a small amount of layering pleural fluid bilaterally. Again demonstrated throughout the thoracic region are areas of geographic nonenhancement within the bones. This pattern can be seen with widespread bone infarction or hematopoetic states particularly T-cell lymphoma. There is no sign of focal enhancement to suggest osteomyelitis or septic joint. I think this marrow pattern also may be affecting the ribs, an this could be a systemic osseous condition.  IMPRESSION: There is no finding to allow diagnosis of focal bone or joint infection. No fracture. No significant disc pathology.  Areas of geographic  nonenhancement throughout the vertebral body marrow and probably also affecting the ribs. This pattern can be seen in widespread bone infarction or in hematopoetic diseases, particularly T-cell lymphoma.   Electronically Signed   By: Nelson Chimes M.D.   On: 02/24/2014 20:05   US Abdomen Complete  02/24/2014   CLINICAL DATA:  Abdominal pain.  EXAM: ULTRASOUND ABDOMEN COMPLETE  COMPARISON:  CT abdomen and pelvis 02/23/2014.  FINDINGS: Gallbladder:  No gallstones or wall thickening visualized. No sonographic Murphy sign noted.  Common bile duct:  Diameter: 0.4 cm  Liver:  Nodularity of the liver border is better demonstrated on CT scan. No focal liver lesion or intrahepatic biliary ductal dilatation.  IVC:  No abnormality visualized.  Pancreas:  Visualized portion unremarkable.  Spleen:  Size and appearance within normal limits.  Right Kidney:  Length: 11.3 cm. Echogenicity within normal limits. No mass or hydronephrosis visualized.  Left Kidney:  Length: 11.6 cm. Echogenicity within normal limits. No mass or hydronephrosis visualized.  Abdominal aorta:  No aneurysm visualized.  Other findings:  None.  IMPRESSION: Negative for gallstones.  No acute finding.  Nodularity of the liver border consistent with cirrhosis is better demonstrated on comparison CT scan.   Electronically Signed   By: Inge Rise M.D.   On: 02/24/2014 03:45   Nm Myocar Multi W/spect W/wall Motion / Ef  02/05/2014   CLINICAL DATA:  Chest pain, history of diabetes and CAD  EXAM: MYOCARDIAL IMAGING WITH SPECT (REST AND PHARMACOLOGIC-STRESS)  GATED LEFT VENTRICULAR WALL MOTION STUDY  LEFT VENTRICULAR EJECTION FRACTION  TECHNIQUE: Standard myocardial SPECT imaging was performed after resting intravenous injection of 10 mCi Tc-59m sestamibi.  Subsequently, intravenous infusion of Lexiscan was performed under the supervision of the Cardiology staff. At peak effect of the drug, 30 mCi Tc-39m sestamibi was injected intravenously and standard  myocardial SPECT imaging was performed. Quantitative gated imaging was also performed to evaluate left ventricular wall motion, and estimate left ventricular ejection fraction.  COMPARISON:  CT ANGIO CHEST W/CM &/OR WO/CM dated 02/04/2014; DG CHEST 2 VIEW dated 02/04/2014  FINDINGS: Review of the rotational raw images demonstrates significant breast and chest wall attenuation on both the provided rest and stress images. Mild GI activity is seen on both the provided rest and stress images. No significant patient motion artifact.  SPECT imaging demonstrates there is a very minimal amount of matched attenuation involving the left ventricular apex which is without associated wall motion abnormality. No scintigraphic evidence of prior infarction or pharmacologically induced ischemia.  Quantitative gated analysis demonstrates normal wall motion.  The resting left ventricular ejection fraction is 24% with end-diastolic volume of 57 ml and end-systolic volume of 21 ml.  IMPRESSION: 1. No scintigraphic evidence of prior infarction or pharmacologically induced ischemia. 2. Normal wall motion.  Ejection fraction is 63%.   Electronically Signed   By: Sandi Mariscal M.D.   On: 02/05/2014 12:34   Ct Biopsy  02/28/2014   CLINICAL DATA:  73 year old with pancytopenia.  EXAM: CT GUIDED BONE MARROW ASPIRATES AND BIOPSY  Physician: Stephan Minister. Henn, MD  MEDICATIONS: 0.5 mg Versed, 50 mcg fentanyl. A radiology nurse monitored the patient for moderate sedation.  ANESTHESIA/SEDATION: Sedation time: 20 min  PROCEDURE: The procedure was explained to the patient. The risks and benefits of the procedure were discussed and the patient's questions were addressed. Informed consent was obtained from the patient. The patient was placed prone on CT scan. Images of the pelvis were obtained. The right side of back was prepped and draped in sterile fashion. The skin and right posterior iliac bone were anesthetized with 1% lidocaine. 11 gauge bone needle  was directed into the right iliac bone with CT guidance. No aspirate material could be obtained. As a result, multiple core biopsies were obtained. A total of 5 core biopsies were performed. Three of the core biopsies yielded soft tan colored material.  FINDINGS: No aspirate could be obtained.  Soft core material was obtained.  COMPLICATIONS: None  IMPRESSION: CT guided bone marrow biopsy.   Electronically Signed   By: Markus Daft M.D.   On: 02/28/2014 14:25   Dg Chest Port 1 View  02/28/2014   CLINICAL DATA:  Fever.  EXAM: PORTABLE CHEST - 1 VIEW  COMPARISON:  CT BIOPSY dated 02/28/2014; CT ANGIO CHEST W/CM &/OR WO/CM dated 02/04/2014; DG CHEST 2 VIEW dated 02/04/2014  FINDINGS: Mediastinum and hilar structures are normal. Borderline cardiomegaly, unchanged. Pulmonary vascularity is normal. No pleural effusion or pneumothorax. No focal pulmonary infiltrate. Questionable pulmonary nodule is noted. This is projected over the anterior right fourth rib. Chest CT is suggested for further evaluation. No acute bony abnormality.  IMPRESSION: Questionable subcentimeter pulmonary nodule periphery of the right mid lung at the level of the anterior right fourth rib. Chest CT suggested for further evaluation.   Electronically Signed   By: [place]   On: 02/28/2014 18:03   Dg Chest Port 1 View  02/23/2014   CLINICAL DATA:  Fever.  EXAM: PORTABLE CHEST - 1 VIEW  COMPARISON:  Chest x-ray 01/29/2014 .  FINDINGS: Mediastinum and hilar structures are normal. Heart size upper limits normal, no pulmonary venous congestion.  Minimal left base infiltrate cannot be excluded. No pleural effusion or pneumothorax. No acute bony abnormality. Degenerative changes thoracic spine and both shoulders.  IMPRESSION: Cannot exclude a minimal infiltrate in the left lung base.   Electronically Signed   By: Marcello Moores  Register   On: 02/23/2014 18:45   Dg Bone Survey Met  02/27/2014   CLINICAL DATA:  Unusual findings on MRI. Question diffuse  metastatic lymphoma.  EXAM: METASTATIC BONE SURVEY  COMPARISON:  None.  FINDINGS: Skull: Hyperostosis frontalis. No focal lytic lesion or acute bony abnormality.  Cervical spine: Mild degenerative disc and facet disease. Normal alignment. Prevertebral soft tissues are normal. No visualized focal lesion or acute bony abnormality.  Thoracic spine: Degenerative spurring throughout the mid and lower thoracic spine. No focal lytic lesion or acute bony abnormality.  Lumbar spine: Anterior degenerative spurring. Normal alignment. No focal lytic lesion or acute bony abnormality.  Pelvis: Contrast material within the right-sided colon obscures the right iliac bone. Mild degenerative changes in the hips bilaterally which are symmetric. No focal lytic lesion or acute bony abnormality.  Bilateral femurs:  No focal lytic lesion or acute bony abnormality.  Bilateral humeri/ shoulders: Degenerative changes in the [facility] joints bilaterally. No focal lytic lesion or acute bony abnormality.  Chest: Heart is borderline enlarged. Lungs are clear. No effusions or acute bony abnormality. No visible rib abnormality.  IMPRESSION: No focal osseous abnormality to correspond to the abnormalities seen on MRI.   Electronically Signed   By: Rolm Baptise M.D.   On: 02/27/2014 07:31    Labs:  Basic Metabolic Panel:  Recent Labs Lab 02/24/14 0311 02/25/14 0230 02/27/14 1015 03/01/14 0315 03/02/14 0325  NA 138 136* 129* 131* 132*  K 5.3 4.4 5.0 5.2 4.8  CL 102 101 94* 98 101  CO2 $Re'22 21 19 'VjE$ 17* 16*  GLUCOSE 192* 201* 299* 192* 240*  BUN 22 19 41* 67* 57*  CREATININE 1.40* 1.12* 1.72* 2.16* 1.71*  CALCIUM 8.5 8.6 8.6 8.2* 8.1*   GFR Estimated Creatinine Clearance: 29.7 ml/min (by C-G formula based on Cr of 1.71). Liver Function Tests:  Recent Labs Lab 02/24/14 0040 02/24/14 0311 02/25/14 0230  AST $Re'17 21 28  'VHM$ ALT $R'6 6 7  'uS$ ALKPHOS 790* 805* 1575*  BILITOT 0.5 0.6 0.4  PROT 6.4 6.5 6.3  ALBUMIN 2.8* 2.9* 2.7*    Recent  Labs Lab 02/24/14 0040  LIPASE 59   CBC:  Recent Labs Lab 02/24/14 0311 02/25/14 0230 02/27/14 1015 03/01/14 0315 03/02/14 0325  WBC 1.8* 2.4* 3.6* 2.3* 2.0*  NEUTROABS 1.1*  --  2.0  --   --   HGB 9.7* 9.4* 8.8* 8.0* 7.8*  HCT 29.9* 29.0* 26.7* 25.0* 23.9*  MCV 77.3* 77.3* 76.1* 76.0* 75.9*  PLT 59* 52* 39* 24* 22*   Cardiac Enzymes:  Recent Labs Lab 02/24/14 0040 02/24/14 0311 02/24/14 0900 02/24/14 1400  TROPONINI <0.30 <0.30 <0.30 <0.30   BNP: No components found with this basename: POCBNP,  CBG:  Recent Labs Lab 03/01/14 0818 03/01/14 1132 03/01/14 1645 03/01/14 2255 03/02/14 0838  GLUCAP 210* 321* 118* 124* 171*   Anemia work up  Recent Labs  02/28/14 0330  FERRITIN 2727*   Microbiology Recent Results (from the past 240 hour(s))  CULTURE, BLOOD (ROUTINE X 2)     Status: None   Collection Time    02/23/14  6:03 PM      Result Value Ref Range Status   Specimen Description BLOOD ARM LEFT  Final   Special Requests BOTTLES DRAWN AEROBIC AND ANAEROBIC 5CC   Final   Culture  Setup Time     Final   Value: 02/24/2014 00:44     Performed at [facility]   Culture     Final   Value: NO GROWTH 5 DAYS     Performed at [facility]   Report Status 03/02/2014 FINAL   Final  CULTURE, BLOOD (ROUTINE X 2)     Status: None   Collection Time    02/23/14  6:10 PM      Result Value Ref Range Status   Specimen Description BLOOD HAND LEFT   Final   Special Requests BOTTLES DRAWN AEROBIC AND ANAEROBIC 10CC   Final   Culture  Setup Time     Final   Value: 02/24/2014 00:45     Performed at [facility]   Culture     Final   Value: NO GROWTH 5 DAYS     Performed at [facility]   Report Status 03/02/2014 FINAL   Final  URINE CULTURE     Status: None   Collection Time    02/23/14  7:40 PM      Result Value Ref Range Status   Specimen Description URINE, CLEAN CATCH   Final   Special Requests NONE   Final   Culture  Setup  Time     Final   Value: 02/24/2014 00:50     Performed at [place]     Final   Value: 75,000 COLONIES/ML     Performed at [facility]   Culture     Final   Value: Multiple bacterial morphotypes present, none predominant. Suggest appropriate recollection if clinically indicated.     Performed at [facility]   Report Status 02/24/2014 FINAL   Final  MRSA PCR SCREENING     Status: None   Collection Time    02/24/14  3:00 AM      Result Value Ref Range Status   MRSA by PCR NEGATIVE  NEGATIVE Final   Comment:            The GeneXpert MRSA Assay (FDA     approved for NASAL specimens     only), is one component of a     comprehensive MRSA colonization     surveillance program. It is not     intended to diagnose MRSA     infection nor to guide or     monitor treatment for     MRSA infections.  CULTURE, BLOOD (ROUTINE X 2)     Status: None   Collection Time    02/28/14  5:00 PM      Result Value Ref Range Status   Specimen Description BLOOD LEFT ARM   Final   Special Requests BOTTLES DRAWN AEROBIC ONLY 3CC   Final   Culture  Setup Time     Final   Value: 02/28/2014 21:35     Performed at [facility]   Culture     Final   Value:        BLOOD CULTURE RECEIVED NO GROWTH TO DATE CULTURE WILL BE HELD FOR 5 DAYS BEFORE ISSUING A FINAL NEGATIVE REPORT     Performed at [facility]   Report Status PENDING   Incomplete  CULTURE, BLOOD (ROUTINE X 2)     Status: None   Collection Time  02/28/14  5:10 PM      Result Value Ref Range Status   Specimen Description BLOOD LEFT HAND   Final   Special Requests BOTTLES DRAWN AEROBIC ONLY 3CC   Final   Culture  Setup Time     Final   Value: 02/28/2014 21:34     Performed at [facility]   Culture     Final   Value:        BLOOD CULTURE RECEIVED NO GROWTH TO DATE CULTURE WILL BE HELD FOR 5 DAYS BEFORE ISSUING A FINAL NEGATIVE REPORT     Performed at [facility]    Report Status PENDING   Incomplete  URINE CULTURE     Status: None   Collection Time    02/28/14  9:52 PM      Result Value Ref Range Status   Specimen Description URINE, CLEAN CATCH   Final   Special Requests NONE   Final   Culture  Setup Time     Final   Value: 03/01/2014 01:57     Performed at [place]     Final   Value: NO GROWTH     Performed at [facility]   Culture     Final   Value: NO GROWTH     Performed at [facility]   Report Status 03/01/2014 FINAL   Final    Time coordinating discharge: 40 minutes.  Signed:  RAMA,CHRISTINA  Pager [phone] [facility] 03/02/2014, 2:13 PM

## 2014-03-02 NOTE — Progress Notes (Signed)
Pt transferring to [place] arranged transfer.  No further social work needs at this time.  CSW signing off.   Alison Murray, MSW, [place] [facility] [phone]

## 2014-03-02 NOTE — Care Management Note (Signed)
Cm spoke with patient and daughter at bedside concerning consent to transfer to [facility]. Patient agreeable to transfer, patient's daughter HPOA signed COBRA form. Cm contacted [facility] physician referral line which confirms Dr.Powell with acute leukemia team to accept patient. [facility] transfer center to contact 3e Oncology with room assignment. RN instructed to write room number on [facility] form and call [facility] for transport at that time.    Venita Lick Maisen Klingler,MSN,RN [phone]

## 2014-03-02 NOTE — Progress Notes (Signed)
Report called to Interior and spatial designer at [place].

## 2014-03-02 NOTE — Progress Notes (Signed)
Transport arrived for pick up from [facility]. Patient transferred.

## 2014-03-03 LAB — GLUCOSE, CAPILLARY
GLUCOSE-CAPILLARY: 256 mg/dL — AB (ref 70–99)
GLUCOSE-CAPILLARY: 240 mg/dL — AB (ref 70–99)

## 2014-03-06 ENCOUNTER — Other Ambulatory Visit: Payer: Self-pay | Admitting: Oncology

## 2014-03-06 LAB — CULTURE, BLOOD (ROUTINE X 2)
CULTURE: NO GROWTH
Culture: NO GROWTH

## 2014-03-08 LAB — CHROMOSOME ANALYSIS, BONE MARROW

## 2014-03-16 ENCOUNTER — Encounter (HOSPITAL_COMMUNITY): Payer: Self-pay

## 2014-03-21 ENCOUNTER — Ambulatory Visit: Payer: Medicare Other | Admitting: Internal Medicine

## 2014-05-16 ENCOUNTER — Telehealth: Payer: Self-pay | Admitting: *Deleted

## 2014-05-16 DIAGNOSIS — C9500 Acute leukemia of unspecified cell type not having achieved remission: Secondary | ICD-10-CM

## 2014-05-16 NOTE — Telephone Encounter (Signed)
Message from Darliss Cheney at [facility] requesting lab appt for pt 05/19/14. Reviewed with Dr. [name], orders sent to schedulers for lab only appt. Left message on voicemail informing Alyse Low that schedulers will contact pt.

## 2014-05-17 ENCOUNTER — Telehealth: Payer: Self-pay | Admitting: Oncology

## 2014-05-17 NOTE — Telephone Encounter (Signed)
christy Stfleur at [facility] called to sched lab...orders were in...she will contact pt

## 2014-05-18 ENCOUNTER — Encounter: Payer: Self-pay | Admitting: Internal Medicine

## 2014-05-18 ENCOUNTER — Telehealth: Payer: Self-pay | Admitting: Internal Medicine

## 2014-05-18 ENCOUNTER — Ambulatory Visit (INDEPENDENT_AMBULATORY_CARE_PROVIDER_SITE_OTHER): Payer: Commercial Managed Care - HMO | Admitting: Internal Medicine

## 2014-05-18 VITALS — BP 120/70 | HR 69 | Temp 98.6°F | Resp 18 | Ht 62.0 in | Wt 153.0 lb

## 2014-05-18 DIAGNOSIS — E119 Type 2 diabetes mellitus without complications: Secondary | ICD-10-CM

## 2014-05-18 DIAGNOSIS — N183 Chronic kidney disease, stage 3 unspecified: Secondary | ICD-10-CM

## 2014-05-18 DIAGNOSIS — C9500 Acute leukemia of unspecified cell type not having achieved remission: Secondary | ICD-10-CM

## 2014-05-18 DIAGNOSIS — I4891 Unspecified atrial fibrillation: Secondary | ICD-10-CM

## 2014-05-18 DIAGNOSIS — D696 Thrombocytopenia, unspecified: Secondary | ICD-10-CM

## 2014-05-18 DIAGNOSIS — E785 Hyperlipidemia, unspecified: Secondary | ICD-10-CM

## 2014-05-18 DIAGNOSIS — I10 Essential (primary) hypertension: Secondary | ICD-10-CM

## 2014-05-18 DIAGNOSIS — C9200 Acute myeloblastic leukemia, not having achieved remission: Secondary | ICD-10-CM

## 2014-05-18 DIAGNOSIS — K7460 Unspecified cirrhosis of liver: Secondary | ICD-10-CM

## 2014-05-18 MED ORDER — LORAZEPAM 0.5 MG PO TABS: 0.5000 mg | ORAL_TABLET | Freq: Four times a day (QID) | ORAL | Status: DC | PRN

## 2014-05-18 MED ORDER — OXYCODONE-ACETAMINOPHEN 7.5-325 MG PO TABS: 1.0000 | ORAL_TABLET | ORAL | Status: DC | PRN

## 2014-05-18 NOTE — Telephone Encounter (Signed)
ok 

## 2014-05-18 NOTE — Telephone Encounter (Signed)
Patient is requesting a referral to see Dr. Florene Glen at [facility]

## 2014-05-18 NOTE — Progress Notes (Signed)
Subjective:    Patient ID: [name], female    DOB: 28-Feb-1941, 73 y.o.   MRN: [id number]  HPI  Admit date: 02/23/2014  Discharge date: 03/02/2014  Recommendations for Outpatient Follow-up:  1. The patient is being transferred to [facility] under the care of Dr. Jerrye Noble. Discharge Diagnoses:  Principal Problem:  Acute leukemia Active Problems:  DIABETES MELLITUS, TYPE II  HYPERLIPIDEMIA  HYPERTENSION  CAD, non obstructive in '02  CKD (chronic kidney disease), stage III  Lesion of pancreas  Cirrhosis of liver without mention of alcohol  Sepsis  Atrial fibrillation with RVR  Cervical spine pain  Alkaline phosphatase elevation  Leukopenia  Thrombocytopenia  Anemia  Abnormal MRI  Discharge Condition: Stable.  Diet recommendation: Low-sodium, heart healthy, carbohydrate modified.  73 year old patient who is seen in followup today after a recent hospital discharge.  She was admitted to the hospital here locally on March 19 and transferred to [facility] for further evaluation and management of acute leukemia.  She was discharged June 8. She is scheduled for evaluation and lab draw at the cancer center tomorrow and will be seen at [facility].  Next week for placement of a Port-A-Cath and further evaluation and treatment.  Medical problems include diabetes.  Currently is on metformin therapy alone.  She was discharged on metformin 2 g daily in divided dosages and has been having some diarrhea. She states her glycemic control has been good  She continues to have considerable pain and is requesting a refill on oxycodone.  She states that she requires this 2 or 3 times daily.  She also is requesting a refill on lorazepam what she also takes 2-3 times daily.  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  She has coronary artery disease, and mild chronic kidney disease.  Past Medical History  Diagnosis Date    . DIABETES MELLITUS, TYPE II 07/13/2007  . HYPERLIPIDEMIA 12/24/2007  . HYPERTENSION 07/13/2007  . CORONARY ARTERY DISEASE 12/24/2007  . NEPHROLITHIASIS, HX OF 12/24/2007  . Colon polyps 04/29/2010    TUBULAR ADENOMA AND A SERRATED ADENOMA  . Acute pancreatitis   . Unspecified disease of pancreas   . Chronic kidney disease, stage II (mild)     History   Social History  . Marital Status: Married    Spouse Name: N/A    Number of Children: N/A  . Years of Education: N/A   Occupational History  . Not on file.   Social History Main Topics  . Smoking status: Former Smoker    Types: Cigarettes    Quit date: 12/08/2000  . Smokeless tobacco: Never Used  . Alcohol Use: No  . Drug Use: No  . Sexual Activity: Not on file   Other Topics Concern  . Not on file   Social History Narrative  . No narrative on file    Past Surgical History  Procedure Laterality Date  . Excisional hemorrhoidectomy  unknown  . Abdominal hysterectomy  unknown  . Nasal sinus surgery  unknown  . Cardiac catheterization  2002  . Cataract extraction Bilateral     Family History  Problem Relation Age of Onset  . Cancer Father     throat ca  . COPD Father   . Cancer Sister     uterine ca and breast ca  . Cancer Brother     throat    No Known Allergies  Current Outpatient Prescriptions on File  Prior to Visit  Medication Sig Dispense Refill  . acetaminophen (TYLENOL) 325 MG tablet Take 2 tablets (650 mg total) by mouth every 6 (six) hours as needed for mild pain (or Fever >/= 101).      Marland Kitchen amLODipine (NORVASC) 5 MG tablet Take 5 mg by mouth daily.      . nitroGLYCERIN (NITROSTAT) 0.4 MG SL tablet Place 1 tablet (0.4 mg total) under the tongue every 5 (five) minutes as needed for chest pain.  60 tablet  5  . pantoprazole (PROTONIX) 40 MG tablet Take 1 tablet (40 mg total) by mouth daily.  30 tablet  4  . polyethylene glycol (MIRALAX / GLYCOLAX) packet Take 17 g by mouth daily as needed for moderate  constipation.  14 each  0  . promethazine (PHENERGAN) 12.5 MG tablet Take 2 tablets (25 mg total) by mouth every 6 (six) hours as needed for nausea.  12 tablet  0  . simvastatin (ZOCOR) 20 MG tablet Take 20 mg by mouth every evening.       No current facility-administered medications on file prior to visit.    BP 120/70  Pulse 69  Temp(Src) 98.6 F (37 C) (Oral)  Resp 18  Ht 5\' 2"  (1.575 m)  Wt 153 lb (69.4 kg)  BMI 27.98 kg/m2  SpO2 98%     Review of Systems  Constitutional: Positive for activity change, appetite change, fatigue and unexpected weight change.  HENT: Negative for congestion, dental problem, hearing loss, rhinorrhea, sinus pressure, sore throat and tinnitus.   Eyes: Negative for pain, discharge and visual disturbance.  Respiratory: Negative for cough and shortness of breath.   Cardiovascular: Negative for chest pain, palpitations and leg swelling.  Gastrointestinal: Negative for nausea, vomiting, abdominal pain, diarrhea, constipation, blood in stool and abdominal distention.  Genitourinary: Negative for dysuria, urgency, frequency, hematuria, flank pain, vaginal bleeding, vaginal discharge, difficulty urinating, vaginal pain and pelvic pain.  Musculoskeletal: Positive for arthralgias, back pain and myalgias. Negative for gait problem and joint swelling.  Skin: Positive for pallor and rash.  Neurological: Positive for weakness. Negative for dizziness, syncope, speech difficulty, numbness and headaches.  Hematological: Negative for adenopathy. Bruises/bleeds easily.  Psychiatric/Behavioral: Negative for behavioral problems, dysphoric mood and agitation. The patient is not nervous/anxious.        Objective:   Physical Exam  Constitutional: She is oriented to person, place, and time. She appears well-developed and well-nourished.   Weak uses a walker Blood pressure 120/70 Weight 153   HENT:  Head: Normocephalic.  Right Ear: External ear normal.  Left Ear:  External ear normal.  Mouth/Throat: Oropharynx is clear and moist.  Eyes: Conjunctivae and EOM are normal. Pupils are equal, round, and reactive to light.  Neck: Normal range of motion. Neck supple. No thyromegaly present.  Cardiovascular: Normal rate, normal heart sounds and intact distal pulses.   Irregular rhythm with a controlled ventricular response  Pulmonary/Chest: Effort normal and breath sounds normal.  Abdominal: Soft. Bowel sounds are normal. She exhibits no mass. There is no tenderness.  Musculoskeletal: Normal range of motion.  Lymphadenopathy:    She has no cervical adenopathy.  Neurological: She is alert and oriented to person, place, and time.  Skin: Skin is warm and dry. No rash noted.  Scattered petechial lesions over the lower arms bilaterally Healing abrasion over the right ankle bald  Psychiatric: She has a normal mood and affect. Her behavior is normal.          Assessment &  Plan:   Leukemia.  Followup local cancer center and [facility].  The patient is receiving home physical therapy  Diabetes mellitus.  Will continue home blood sugar monitoring.  Will decrease metformin to once daily in view of the diarrhea.  Recheck 1 month.  Check hemoglobin A1c at that time  Hypertension  Coronary artery disease  Atrial fibrillation  , presently on aspirin therapy, and not full anticoagulation

## 2014-05-18 NOTE — Telephone Encounter (Signed)
Okay to do referral

## 2014-05-18 NOTE — Patient Instructions (Signed)
Limit your sodium (Salt) intake   Please check your hemoglobin A1c every 3 months  Urology followup

## 2014-05-18 NOTE — Progress Notes (Signed)
Pre-visit discussion using our clinic review tool. No additional management support is needed unless otherwise documented below in the visit note.  

## 2014-05-19 ENCOUNTER — Telehealth: Payer: Self-pay | Admitting: *Deleted

## 2014-05-19 ENCOUNTER — Other Ambulatory Visit (HOSPITAL_BASED_OUTPATIENT_CLINIC_OR_DEPARTMENT_OTHER): Payer: Commercial Managed Care - HMO

## 2014-05-19 DIAGNOSIS — C9500 Acute leukemia of unspecified cell type not having achieved remission: Secondary | ICD-10-CM

## 2014-05-19 DIAGNOSIS — D61818 Other pancytopenia: Secondary | ICD-10-CM

## 2014-05-19 LAB — CBC WITH DIFFERENTIAL/PLATELET
BASO%: 0.1 % (ref 0.0–2.0)
BASOS ABS: 0 10*3/uL (ref 0.0–0.1)
EOS%: 0.1 % (ref 0.0–7.0)
Eosinophils Absolute: 0 10*3/uL (ref 0.0–0.5)
HCT: 29.8 % — ABNORMAL LOW (ref 34.8–46.6)
HGB: 9.9 g/dL — ABNORMAL LOW (ref 11.6–15.9)
LYMPH%: 8.7 % — ABNORMAL LOW (ref 14.0–49.7)
MCH: 28.4 pg (ref 25.1–34.0)
MCHC: 33.1 g/dL (ref 31.5–36.0)
MCV: 85.9 fL (ref 79.5–101.0)
MONO#: 0.9 10*3/uL (ref 0.1–0.9)
MONO%: 12.3 % (ref 0.0–14.0)
NEUT#: 5.8 10*3/uL (ref 1.5–6.5)
NEUT%: 78.8 % — ABNORMAL HIGH (ref 38.4–76.8)
Platelets: 156 10*3/uL (ref 145–400)
RBC: 3.47 10*6/uL — ABNORMAL LOW (ref 3.70–5.45)
RDW: 15.4 % — ABNORMAL HIGH (ref 11.2–14.5)
WBC: 7.4 10*3/uL (ref 3.9–10.3)
lymph#: 0.6 10*3/uL — ABNORMAL LOW (ref 0.9–3.3)

## 2014-05-19 LAB — COMPREHENSIVE METABOLIC PANEL (CC13)
ALK PHOS: 169 U/L — AB (ref 40–150)
AST: 11 U/L (ref 5–34)
Albumin: 2.4 g/dL — ABNORMAL LOW (ref 3.5–5.0)
Anion Gap: 12 mEq/L — ABNORMAL HIGH (ref 3–11)
BUN: 22.4 mg/dL (ref 7.0–26.0)
CALCIUM: 7.9 mg/dL — AB (ref 8.4–10.4)
CHLORIDE: 102 meq/L (ref 98–109)
CO2: 26 mEq/L (ref 22–29)
CREATININE: 2.4 mg/dL — AB (ref 0.6–1.1)
Glucose: 145 mg/dl — ABNORMAL HIGH (ref 70–140)
POTASSIUM: 3.3 meq/L — AB (ref 3.5–5.1)
Sodium: 139 mEq/L (ref 136–145)
Total Bilirubin: 0.62 mg/dL (ref 0.20–1.20)
Total Protein: 6.2 g/dL — ABNORMAL LOW (ref 6.4–8.3)

## 2014-05-19 LAB — LACTATE DEHYDROGENASE (CC13): LDH: 275 U/L — ABNORMAL HIGH (ref 125–245)

## 2014-05-19 LAB — MAGNESIUM (CC13): Magnesium: 1.3 mg/dl — CL (ref 1.5–2.5)

## 2014-05-19 NOTE — Telephone Encounter (Signed)
Faxed results to [facility] and called triage nurse, Cecille Rubin with panic Magnesium level.

## 2014-05-19 NOTE — Telephone Encounter (Signed)
Spoke to pt told her order for referral to Oncology at [facility] with Dr. Jerrye Noble was done.

## 2014-05-22 ENCOUNTER — Telehealth: Payer: Self-pay | Admitting: Internal Medicine

## 2014-05-22 DIAGNOSIS — C9500 Acute leukemia of unspecified cell type not having achieved remission: Secondary | ICD-10-CM

## 2014-05-22 DIAGNOSIS — M549 Dorsalgia, unspecified: Secondary | ICD-10-CM

## 2014-05-22 DIAGNOSIS — R269 Unspecified abnormalities of gait and mobility: Secondary | ICD-10-CM

## 2014-05-22 NOTE — Telephone Encounter (Signed)
Beth called for status of referral. Deborah aware. Fax [phone]

## 2014-05-22 NOTE — Telephone Encounter (Signed)
Order faxed.

## 2014-05-22 NOTE — Telephone Encounter (Signed)
Pt is having a lot of weakness. Cindy nurse from [facility] is requesting a rx for wheelchair and shower bench fax to [phone] [facility]

## 2014-05-24 ENCOUNTER — Observation Stay (HOSPITAL_COMMUNITY)
Admission: EM | Admit: 2014-05-24 | Discharge: 2014-05-25 | Disposition: A | Discharge status: Home / Self Care | Payer: Medicare HMO | Attending: Internal Medicine | Admitting: Internal Medicine

## 2014-05-24 ENCOUNTER — Emergency Department (HOSPITAL_COMMUNITY): Payer: Medicare HMO

## 2014-05-24 ENCOUNTER — Observation Stay (HOSPITAL_COMMUNITY): Payer: Medicare HMO

## 2014-05-24 ENCOUNTER — Encounter (HOSPITAL_COMMUNITY): Payer: Self-pay | Admitting: Emergency Medicine

## 2014-05-24 DIAGNOSIS — N183 Chronic kidney disease, stage 3 unspecified: Secondary | ICD-10-CM

## 2014-05-24 DIAGNOSIS — R0789 Other chest pain: Secondary | ICD-10-CM

## 2014-05-24 DIAGNOSIS — I4891 Unspecified atrial fibrillation: Secondary | ICD-10-CM

## 2014-05-24 DIAGNOSIS — G459 Transient cerebral ischemic attack, unspecified: Secondary | ICD-10-CM | POA: Diagnosis not present

## 2014-05-24 DIAGNOSIS — I10 Essential (primary) hypertension: Secondary | ICD-10-CM

## 2014-05-24 DIAGNOSIS — I2510 Atherosclerotic heart disease of native coronary artery without angina pectoris: Secondary | ICD-10-CM

## 2014-05-24 DIAGNOSIS — Z87442 Personal history of urinary calculi: Secondary | ICD-10-CM

## 2014-05-24 DIAGNOSIS — E119 Type 2 diabetes mellitus without complications: Secondary | ICD-10-CM | POA: Diagnosis not present

## 2014-05-24 DIAGNOSIS — C9200 Acute myeloblastic leukemia, not having achieved remission: Secondary | ICD-10-CM

## 2014-05-24 DIAGNOSIS — L659 Nonscarring hair loss, unspecified: Secondary | ICD-10-CM | POA: Insufficient documentation

## 2014-05-24 DIAGNOSIS — I4892 Unspecified atrial flutter: Secondary | ICD-10-CM | POA: Insufficient documentation

## 2014-05-24 DIAGNOSIS — R079 Chest pain, unspecified: Secondary | ICD-10-CM

## 2014-05-24 DIAGNOSIS — K7460 Unspecified cirrhosis of liver: Secondary | ICD-10-CM

## 2014-05-24 DIAGNOSIS — D638 Anemia in other chronic diseases classified elsewhere: Secondary | ICD-10-CM | POA: Diagnosis present

## 2014-05-24 DIAGNOSIS — Z79899 Other long term (current) drug therapy: Secondary | ICD-10-CM | POA: Insufficient documentation

## 2014-05-24 DIAGNOSIS — T451X5A Adverse effect of antineoplastic and immunosuppressive drugs, initial encounter: Secondary | ICD-10-CM | POA: Insufficient documentation

## 2014-05-24 DIAGNOSIS — I319 Disease of pericardium, unspecified: Secondary | ICD-10-CM

## 2014-05-24 DIAGNOSIS — E162 Hypoglycemia, unspecified: Secondary | ICD-10-CM

## 2014-05-24 DIAGNOSIS — R82998 Other abnormal findings in urine: Secondary | ICD-10-CM

## 2014-05-24 DIAGNOSIS — Z7982 Long term (current) use of aspirin: Secondary | ICD-10-CM | POA: Insufficient documentation

## 2014-05-24 DIAGNOSIS — D649 Anemia, unspecified: Secondary | ICD-10-CM

## 2014-05-24 DIAGNOSIS — R748 Abnormal levels of other serum enzymes: Secondary | ICD-10-CM

## 2014-05-24 DIAGNOSIS — R609 Edema, unspecified: Secondary | ICD-10-CM

## 2014-05-24 DIAGNOSIS — N184 Chronic kidney disease, stage 4 (severe): Secondary | ICD-10-CM | POA: Diagnosis present

## 2014-05-24 DIAGNOSIS — R933 Abnormal findings on diagnostic imaging of other parts of digestive tract: Secondary | ICD-10-CM

## 2014-05-24 DIAGNOSIS — C9500 Acute leukemia of unspecified cell type not having achieved remission: Secondary | ICD-10-CM | POA: Diagnosis not present

## 2014-05-24 DIAGNOSIS — Z8673 Personal history of transient ischemic attack (TIA), and cerebral infarction without residual deficits: Secondary | ICD-10-CM | POA: Diagnosis present

## 2014-05-24 DIAGNOSIS — D320 Benign neoplasm of cerebral meninges: Secondary | ICD-10-CM | POA: Insufficient documentation

## 2014-05-24 DIAGNOSIS — M542 Cervicalgia: Secondary | ICD-10-CM

## 2014-05-24 DIAGNOSIS — R1013 Epigastric pain: Secondary | ICD-10-CM

## 2014-05-24 DIAGNOSIS — E785 Hyperlipidemia, unspecified: Secondary | ICD-10-CM

## 2014-05-24 DIAGNOSIS — Z87891 Personal history of nicotine dependence: Secondary | ICD-10-CM | POA: Insufficient documentation

## 2014-05-24 DIAGNOSIS — Z794 Long term (current) use of insulin: Secondary | ICD-10-CM

## 2014-05-24 DIAGNOSIS — M25559 Pain in unspecified hip: Secondary | ICD-10-CM

## 2014-05-24 DIAGNOSIS — M549 Dorsalgia, unspecified: Secondary | ICD-10-CM

## 2014-05-24 DIAGNOSIS — I129 Hypertensive chronic kidney disease with stage 1 through stage 4 chronic kidney disease, or unspecified chronic kidney disease: Secondary | ICD-10-CM | POA: Insufficient documentation

## 2014-05-24 DIAGNOSIS — A419 Sepsis, unspecified organism: Secondary | ICD-10-CM

## 2014-05-24 DIAGNOSIS — K869 Disease of pancreas, unspecified: Secondary | ICD-10-CM

## 2014-05-24 DIAGNOSIS — IMO0001 Reserved for inherently not codable concepts without codable children: Secondary | ICD-10-CM | POA: Diagnosis present

## 2014-05-24 DIAGNOSIS — I484 Atypical atrial flutter: Secondary | ICD-10-CM

## 2014-05-24 DIAGNOSIS — E1169 Type 2 diabetes mellitus with other specified complication: Secondary | ICD-10-CM | POA: Insufficient documentation

## 2014-05-24 DIAGNOSIS — D696 Thrombocytopenia, unspecified: Secondary | ICD-10-CM

## 2014-05-24 DIAGNOSIS — D72819 Decreased white blood cell count, unspecified: Secondary | ICD-10-CM

## 2014-05-24 DIAGNOSIS — K859 Acute pancreatitis without necrosis or infection, unspecified: Secondary | ICD-10-CM

## 2014-05-24 DIAGNOSIS — N179 Acute kidney failure, unspecified: Secondary | ICD-10-CM | POA: Insufficient documentation

## 2014-05-24 DIAGNOSIS — R9389 Abnormal findings on diagnostic imaging of other specified body structures: Secondary | ICD-10-CM

## 2014-05-24 LAB — COMPREHENSIVE METABOLIC PANEL
ALT: 23 U/L (ref 0–35)
AST: 66 U/L — ABNORMAL HIGH (ref 0–37)
Albumin: 2.3 g/dL — ABNORMAL LOW (ref 3.5–5.2)
Alkaline Phosphatase: 250 U/L — ABNORMAL HIGH (ref 39–117)
BUN: 24 mg/dL — AB (ref 6–23)
CALCIUM: 7.9 mg/dL — AB (ref 8.4–10.5)
CO2: 25 meq/L (ref 19–32)
CREATININE: 1.83 mg/dL — AB (ref 0.50–1.10)
Chloride: 98 mEq/L (ref 96–112)
GFR calc non Af Amer: 26 mL/min — ABNORMAL LOW (ref >90)
GFR, EST AFRICAN AMERICAN: 31 mL/min — AB (ref >90)
GLUCOSE: 128 mg/dL — AB (ref 70–99)
Potassium: 3.4 mEq/L — ABNORMAL LOW (ref 3.7–5.3)
Sodium: 140 mEq/L (ref 137–147)
TOTAL PROTEIN: 6.5 g/dL (ref 6.0–8.3)
Total Bilirubin: 0.5 mg/dL (ref 0.3–1.2)

## 2014-05-24 LAB — URINALYSIS, ROUTINE W REFLEX MICROSCOPIC
Bilirubin Urine: NEGATIVE
GLUCOSE, UA: NEGATIVE mg/dL
Hgb urine dipstick: NEGATIVE
KETONES UR: NEGATIVE mg/dL
Nitrite: NEGATIVE
PROTEIN: 100 mg/dL — AB
Specific Gravity, Urine: 1.015 (ref 1.005–1.030)
Urobilinogen, UA: 0.2 mg/dL (ref 0.0–1.0)
pH: 6 (ref 5.0–8.0)

## 2014-05-24 LAB — URINE MICROSCOPIC-ADD ON

## 2014-05-24 LAB — DIFFERENTIAL
Basophils Absolute: 0 10*3/uL (ref 0.0–0.1)
Basophils Relative: 0 % (ref 0–1)
EOS ABS: 0 10*3/uL (ref 0.0–0.7)
Eosinophils Relative: 0 % (ref 0–5)
LYMPHS ABS: 0.4 10*3/uL — AB (ref 0.7–4.0)
Lymphocytes Relative: 6 % — ABNORMAL LOW (ref 12–46)
MONOS PCT: 7 % (ref 3–12)
Monocytes Absolute: 0.4 10*3/uL (ref 0.1–1.0)
Neutro Abs: 5.1 10*3/uL (ref 1.7–7.7)
Neutrophils Relative %: 87 % — ABNORMAL HIGH (ref 43–77)

## 2014-05-24 LAB — PROTIME-INR
INR: 1.11 (ref 0.00–1.49)
PROTHROMBIN TIME: 14.1 s (ref 11.6–15.2)

## 2014-05-24 LAB — GLUCOSE, CAPILLARY
Glucose-Capillary: 58 mg/dL — ABNORMAL LOW (ref 70–99)
Glucose-Capillary: 134 mg/dL — ABNORMAL HIGH (ref 70–99)
Glucose-Capillary: 107 mg/dL — ABNORMAL HIGH (ref 70–99)
Glucose-Capillary: 141 mg/dL — ABNORMAL HIGH (ref 70–99)

## 2014-05-24 LAB — CBC
HEMATOCRIT: 25.3 % — AB (ref 36.0–46.0)
HEMOGLOBIN: 8.5 g/dL — AB (ref 12.0–15.0)
MCH: 27.9 pg (ref 26.0–34.0)
MCHC: 33.6 g/dL (ref 30.0–36.0)
MCV: 83 fL (ref 78.0–100.0)
Platelets: 135 10*3/uL — ABNORMAL LOW (ref 150–400)
RBC: 3.05 MIL/uL — ABNORMAL LOW (ref 3.87–5.11)
RDW: 15.6 % — ABNORMAL HIGH (ref 11.5–15.5)
WBC: 6.1 10*3/uL (ref 4.0–10.5)

## 2014-05-24 LAB — HEMOGLOBIN A1C
HEMOGLOBIN A1C: 6.4 % — AB (ref <5.7)
Mean Plasma Glucose: 137 mg/dL — ABNORMAL HIGH (ref <117)

## 2014-05-24 LAB — MAGNESIUM: Magnesium: 1.6 mg/dL (ref 1.5–2.5)

## 2014-05-24 LAB — I-STAT TROPONIN, ED: TROPONIN I, POC: 0.04 ng/mL (ref 0.00–0.08)

## 2014-05-24 LAB — CBG MONITORING, ED: GLUCOSE-CAPILLARY: 136 mg/dL — AB (ref 70–99)

## 2014-05-24 LAB — APTT: aPTT: 31 seconds (ref 24–37)

## 2014-05-24 MED ORDER — POTASSIUM CHLORIDE CRYS ER 20 MEQ PO TBCR
40.0000 meq | EXTENDED_RELEASE_TABLET | ORAL | Status: AC
  Administered 2014-05-24 (×2): 40 meq via ORAL
  Filled 2014-05-24 (×2): qty 2

## 2014-05-24 MED ORDER — LORAZEPAM 0.5 MG PO TABS: 0.5000 mg | ORAL_TABLET | Freq: Four times a day (QID) | ORAL | Status: DC | PRN

## 2014-05-24 MED ORDER — OXYCODONE-ACETAMINOPHEN 5-325 MG PO TABS
1.0000 | ORAL_TABLET | ORAL | Status: DC | PRN
  Administered 2014-05-24: 1 via ORAL
  Filled 2014-05-24: qty 1

## 2014-05-24 MED ORDER — PANTOPRAZOLE SODIUM 40 MG PO TBEC
40.0000 mg | DELAYED_RELEASE_TABLET | Freq: Every day | ORAL | Status: DC
  Administered 2014-05-24 – 2014-05-25 (×2): 40 mg via ORAL
  Filled 2014-05-24 (×2): qty 1

## 2014-05-24 MED ORDER — ENOXAPARIN SODIUM 30 MG/0.3ML ~~LOC~~ SOLN
30.0000 mg | SUBCUTANEOUS | Status: DC
  Administered 2014-05-24 – 2014-05-25 (×2): 30 mg via SUBCUTANEOUS
  Filled 2014-05-24 (×3): qty 0.3

## 2014-05-24 MED ORDER — AMLODIPINE BESYLATE 5 MG PO TABS
5.0000 mg | ORAL_TABLET | Freq: Every day | ORAL | Status: DC
  Administered 2014-05-24 – 2014-05-25 (×2): 5 mg via ORAL
  Filled 2014-05-24 (×2): qty 1

## 2014-05-24 MED ORDER — INSULIN ASPART 100 UNIT/ML ~~LOC~~ SOLN: 0.0000 [IU] | SUBCUTANEOUS | Status: DC

## 2014-05-24 MED ORDER — INSULIN ASPART 100 UNIT/ML ~~LOC~~ SOLN
0.0000 [IU] | Freq: Three times a day (TID) | SUBCUTANEOUS | Status: DC
  Administered 2014-05-24: 1 [IU] via SUBCUTANEOUS
  Administered 2014-05-25: 3 [IU] via SUBCUTANEOUS
  Administered 2014-05-25: 5 [IU] via SUBCUTANEOUS

## 2014-05-24 MED ORDER — NITROGLYCERIN 0.4 MG SL SUBL: 0.4000 mg | SUBLINGUAL_TABLET | SUBLINGUAL | Status: DC | PRN

## 2014-05-24 MED ORDER — PROMETHAZINE HCL 25 MG PO TABS: 25.0000 mg | ORAL_TABLET | Freq: Four times a day (QID) | ORAL | Status: DC | PRN

## 2014-05-24 MED ORDER — POLYETHYLENE GLYCOL 3350 17 G PO PACK
17.0000 g | PACK | Freq: Every day | ORAL | Status: DC | PRN
  Filled 2014-05-24: qty 1

## 2014-05-24 MED ORDER — ASPIRIN EC 325 MG PO TBEC
325.0000 mg | DELAYED_RELEASE_TABLET | Freq: Every day | ORAL | Status: DC
  Administered 2014-05-24 – 2014-05-25 (×2): 325 mg via ORAL
  Filled 2014-05-24 (×2): qty 1

## 2014-05-24 MED ORDER — OXYCODONE HCL 5 MG PO TABS: 2.5000 mg | ORAL_TABLET | ORAL | Status: DC | PRN

## 2014-05-24 MED ORDER — ASPIRIN 325 MG PO TABS: 325.0000 mg | ORAL_TABLET | Freq: Every day | ORAL | Status: DC

## 2014-05-24 MED ORDER — METOPROLOL TARTRATE 50 MG PO TABS
50.0000 mg | ORAL_TABLET | Freq: Two times a day (BID) | ORAL | Status: DC
  Administered 2014-05-24 – 2014-05-25 (×3): 50 mg via ORAL
  Filled 2014-05-24 (×4): qty 1

## 2014-05-24 MED ORDER — BOOST / RESOURCE BREEZE PO LIQD
1.0000 | ORAL | Status: DC
  Administered 2014-05-25: 1 via ORAL

## 2014-05-24 MED ORDER — MAGNESIUM OXIDE 400 (241.3 MG) MG PO TABS
400.0000 mg | ORAL_TABLET | Freq: Two times a day (BID) | ORAL | Status: DC
  Administered 2014-05-24 – 2014-05-25 (×2): 400 mg via ORAL
  Filled 2014-05-24 (×5): qty 1

## 2014-05-24 MED ORDER — SIMVASTATIN 20 MG PO TABS
20.0000 mg | ORAL_TABLET | Freq: Every evening | ORAL | Status: DC
  Administered 2014-05-24: 20 mg via ORAL
  Filled 2014-05-24: qty 1
  Filled 2014-05-24: qty 4
  Filled 2014-05-24: qty 1

## 2014-05-24 MED ORDER — OXYCODONE-ACETAMINOPHEN 7.5-325 MG PO TABS: 1.0000 | ORAL_TABLET | ORAL | Status: DC | PRN

## 2014-05-24 NOTE — Progress Notes (Signed)
  Echocardiogram 2D Echocardiogram has been performed.  Donata Clay 05/24/2014, 12:13 PM

## 2014-05-24 NOTE — Progress Notes (Signed)
VASCULAR LAB PRELIMINARY  PRELIMINARY  PRELIMINARY  PRELIMINARY  Carotid duplex  completed.    Preliminary report:  Bilateral:  1-39% ICA stenosis.  Vertebral artery flow is antegrade.      CESTONE, HELENE, RVT 05/24/2014, 1:51 PM

## 2014-05-24 NOTE — H&P (Signed)
[facility] History and Physical  [name] [id number] DOB: 27-Jan-1941 DOA: 05/24/2014  Referring physician:  PCP: Catherine Cowden, MD  Specialists:   Chief Complaint:   HPI: [name] is a 73 y.o. female with a history of CAD, PAF, HTN, DM2, hyperlipidemia, and CKD Stage II Dz who was brought to the ED with symptoms of Left sided weakness, and dysarthria/ garbled speech which was noticed by her husband at 59 pm.   She had been last seen normal at 10 pm.   She had been having problems with hypoglycemia for the past 3-4 days and she reports that sh stopped taking her Metformin .  When EMS arrived her blood sugar was found to be 66.    EMS administered IV D50 x 1 with improvement in her blood sugars and her symptoms.    She does not remember what had happened.   She was seen in the ED as a Code Stroke and seen by Neurology Dr [name] and referred for a TIA workup.      Review of Systems:   Unable to Obtain from Patient  Past Medical History  Diagnosis Date  . DIABETES MELLITUS, TYPE II 07/13/2007  . HYPERLIPIDEMIA 12/24/2007  . HYPERTENSION 07/13/2007  . CORONARY ARTERY DISEASE 12/24/2007  . NEPHROLITHIASIS, HX OF 12/24/2007  . Colon polyps 04/29/2010    TUBULAR ADENOMA AND A SERRATED ADENOMA  . Acute pancreatitis   . Unspecified disease of pancreas   . Chronic kidney disease, stage II (mild)     Past Surgical History  Procedure Laterality Date  . Excisional hemorrhoidectomy  unknown  . Abdominal hysterectomy  unknown  . Nasal sinus surgery  unknown  . Cardiac catheterization  2002  . Cataract extraction Bilateral      Prior to Admission medications   Medication Sig Start Date End Date Taking? Authorizing Provider  acetaminophen (TYLENOL) 325 MG tablet Take 2 tablets (650 mg total) by mouth every 6 (six) hours as needed for mild pain (or Fever >/= 101). 03/02/14  Yes Christina P Rama, MD  amLODipine (NORVASC) 5 MG tablet Take 5 mg by mouth daily.   Yes  Historical Provider, MD  aspirin 325 MG EC tablet Take 325 mg by mouth daily.   Yes Historical Provider, MD  LORazepam (ATIVAN) 0.5 MG tablet Take 1 tablet (0.5 mg total) by mouth every 6 (six) hours as needed for anxiety. 05/18/14  Yes Marletta Lor, MD  metFORMIN (GLUCOPHAGE) 1000 MG tablet Take 1,000 mg by mouth 2 (two) times daily with a meal.   Yes Historical Provider, MD  nitroGLYCERIN (NITROSTAT) 0.4 MG SL tablet Place 1 tablet (0.4 mg total) under the tongue every 5 (five) minutes as needed for chest pain. 02/06/14  Yes Ripudeep Krystal Eaton, MD  oxyCODONE-acetaminophen (PERCOCET) 7.5-325 MG per tablet Take 1 tablet by mouth every 4 (four) hours as needed for pain. 05/18/14  Yes Marletta Lor, MD  pantoprazole (PROTONIX) 40 MG tablet Take 1 tablet (40 mg total) by mouth daily. 02/06/14  Yes Ripudeep Krystal Eaton, MD  polyethylene glycol (MIRALAX / GLYCOLAX) packet Take 17 g by mouth daily as needed for moderate constipation. 03/02/14  Yes Venetia Maxon Rama, MD  promethazine (PHENERGAN) 12.5 MG tablet Take 2 tablets (25 mg total) by mouth every 6 (six) hours as needed for nausea. 05/10/13  Yes Elwyn Lade, PA-C  simvastatin (ZOCOR) 20 MG tablet Take 20 mg by mouth every evening.   Yes Historical Provider, MD  metoprolol (LOPRESSOR) 50 MG tablet Take 50 mg by mouth 2 (two) times daily.    Historical Provider, MD    No Known Allergies   Social History:  reports that she quit smoking about 13 years ago. Her smoking use included Cigarettes. She smoked 0.00 packs per day. She has never used smokeless tobacco. She reports that she does not drink alcohol or use illicit drugs.     Family History  Problem Relation Age of Onset  . Cancer Father     throat ca  . COPD Father   . Cancer Sister     uterine ca and breast ca  . Cancer Brother     throat      Physical Exam:  GEN:  Pleasant Obese  73 y.o. Caucasian female with Alopecia due to Chemotherapy examined  and in no acute distress; cooperative  with exam Filed Vitals:   05/24/14 0400 05/24/14 0420  BP: 155/67 152/76  Pulse: 71 88  Temp:  98 F (36.7 C)  TempSrc:  Oral  Resp: 16 19  SpO2: 99% 99%   Blood pressure 152/76, pulse 88, temperature 98 F (36.7 C), temperature source Oral, resp. rate 19, SpO2 99.00%. PSYCH: She is alert and oriented x3; does not appear anxious does not appear depressed; affect is normal HEENT: Normocephalic and Atraumatic, Mucous membranes pink; PERRLA; EOM intact; Fundi:  Benign;  No scleral icterus, Nares: Patent, Oropharynx: Clear, Edentulous, Neck:  FROM, no cervical lymphadenopathy nor thyromegaly or carotid bruit; no JVD; Breasts:: Not examined CHEST WALL: No tenderness CHEST: Normal respiration, clear to auscultation bilaterally HEART: Regular rate and rhythm; no murmurs rubs or gallops BACK: No kyphosis or scoliosis; no CVA tenderness ABDOMEN: Positive Bowel Sounds, Obese, soft non-tender; no masses, no organomegaly, no pannus; no intertriginous candida. Rectal Exam: Not done EXTREMITIES: No cyanosis, clubbing or edema; no ulcerations. Genitalia: not examined PULSES: 2+ and symmetric SKIN: Normal hydration no rash or ulceration CNS:   Mental Status:  Alert, oriented, thought content appropriate. Speech fluent without evidence of aphasia. Able to follow 3 step commands without difficulty. In No obvious pain.  Cranial Nerves:  II: Discs flat bilaterally; Visual fields Intact, Pupils equal and reactive.  III,IV, VI: extra-ocular motions intact bilaterally  V,VII: smile symmetric, facial light touch sensation normal bilaterally  VIII: hearing decreasesd bilaterally  IX,X: gag reflex present  XI: bilateral shoulder shrug  XII: midline tongue extension  Motor:  Right : Upper extremity 5/5   Left: Upper extremity 5/5  Right:  Lower extremity 5/5   Left: Lower extremity 5/5  Tone and bulk:normal tone throughout; no atrophy noted  Sensory: Pinprick and light touch intact throughout,  bilaterally  Deep Tendon Reflexes: 2+ and symmetric throughout  Plantars/ Babinski: Right: normal Left: normal   Cerebellar:  Finger to nose with or without difficulty.  Gait: deferred  Vascular: pulses palpable throughout    Labs on Admission:  Basic Metabolic Panel:  Recent Labs Lab 05/19/14 1137 05/19/14 1137 05/24/14 0330  NA  --  139 140  K  --  3.3* 3.4*  CL  --   --  98  CO2  --  26 25  GLUCOSE  --  145* 128*  BUN  --  22.4 24*  CREATININE  --  2.4* 1.83*  CALCIUM  --  7.9* 7.9*  MG 1.3*  --  1.6   Liver Function Tests:  Recent Labs Lab 05/19/14 1137 05/24/14 0330  AST 11 66*  ALT <6 23  ALKPHOS 169* 250*  BILITOT 0.62 0.5  PROT 6.2* 6.5  ALBUMIN 2.4* 2.3*   No results found for this basename: LIPASE, AMYLASE,  in the last 168 hours No results found for this basename: AMMONIA,  in the last 168 hours CBC:  Recent Labs Lab 05/19/14 1137 05/24/14 0413  WBC 7.4 6.1  NEUTROABS 5.8  --   HGB 9.9 Repeated and Verified* 8.5*  HCT 29.8* 25.3*  MCV 85.9 83.0  PLT 156 135*   Cardiac Enzymes: No results found for this basename: CKTOTAL, CKMB, CKMBINDEX, TROPONINI,  in the last 168 hours  BNP (last 3 results) No results found for this basename: PROBNP,  in the last 8760 hours CBG:  Recent Labs Lab 05/24/14 0327  GLUCAP 136*    Radiological Exams on Admission: Dg Chest 2 View  05/24/2014   CLINICAL DATA:  Weakness.  EXAM: CHEST  2 VIEW  COMPARISON:  Chest radiograph performed 02/28/2014  FINDINGS: The lungs are well-aerated. Small bilateral pleural effusions are suspected on the lateral view. Pulmonary vascularity is at the upper limits of normal. There is no evidence of focal opacification or pneumothorax.  The heart is mildly enlarged. A right-sided chest port is noted ending about the distal SVC. No acute osseous abnormalities are seen.  IMPRESSION: Small bilateral pleural effusions suspected; lungs otherwise clear. Mild cardiomegaly.   Electronically  Signed   By: Garald Balding M.D.   On: 05/24/2014 04:35   Ct Head (brain) Wo Contrast  05/24/2014   CLINICAL DATA:  Code stroke. Right-sided weakness and slurred speech.  EXAM: CT HEAD WITHOUT CONTRAST  TECHNIQUE: Contiguous axial images were obtained from the base of the skull through the vertex without intravenous contrast.  COMPARISON:  None.  FINDINGS: There is no evidence of acute infarction, mass lesion, or intra- or extra-axial hemorrhage on CT.  Scattered periventricular and subcortical white matter change likely reflects small vessel ischemic microangiopathy. An apparent densely calcified 1.6 cm meningioma is noted at the anterior falx cerebri.  The posterior fossa, including the cerebellum, brainstem and fourth ventricle, is within normal limits. The third and lateral ventricles, and basal ganglia are unremarkable in appearance. The cerebral hemispheres demonstrate grossly normal gray-white differentiation. No midline shift is seen.  There is no evidence of fracture; visualized osseous structures are unremarkable in appearance. The orbits are within normal limits. The paranasal sinuses and mastoid air cells are well-aerated. No significant soft tissue abnormalities are seen.  IMPRESSION: 1. No acute intracranial pathology seen on CT. 2. Scattered small vessel ischemic microangiopathy. 3. Densely calcified 1.6 cm meningioma noted at the anterior falx cerebri.  These results were called by telephone at the time of interpretation on 05/24/2014 at 3:56 AM to Dr. Nicole [name], who verbally acknowledged these results.   Electronically Signed   By: Garald Balding M.D.   On: 05/24/2014 03:58     Assessment/Plan:     73 y.o. female with  Principal Problem:   TIA (transient ischemic attack) Active Problems:   Hypoglycemia   DIABETES MELLITUS, TYPE II   HYPERLIPIDEMIA   HYPERTENSION   CAD, non obstructive in '02   PAF- flutter   Thrombocytopenia   Anemia   CKD (chronic kidney disease) stage 3, GFR 30-59  ml/min   AML (acute myeloblastic leukemia)     1.   TIA-  TIA Workup ordered, CT scan of brain Negative for Acute findings,  ASA Rx.  MRI/MRA of Brain, Carotid US, and  2-D ECHO this AM.  Neuro checks, and Check HbA1c and Fasting Lipids.    2.    Hypoglycemia-  Hold oral hypoglycemics, monitor Glucose levels q 1 hr x6 and then q 4 hrs,  May have decreased her Glycogen reserves.       3.    DM2-  On Glucophage RX, but on hold due to #2.    SSI coverage PRN.     4.    CAD- stable on ASA Rx.  And Metoprolol.    5.    HTN-  Continue Metoprolol, and  Amlodipine Rx,   Monitor BPs.    6.    PAF/A.Flutter-   On ASA Rx.   And Metoprolol Rx.    7.    Thrombocytopenia-   Due to Cirrhosis  Hx, and consequence of Cancer and Cancer rx.    8.     CKD Stage III-   May have degree of pre-renal azotemia, Gentle IVFs ordered,  Monitor BUN/Cr trend.  ,.       9.     Hyperlipidemia-  On Zocor Rx.    10.   AML-  Last Chemo in 02/2014 at [facility],  hospitalized until 1 week ago.    11.   SCDs for DVT prophylaxis.        12.  Other- Request Medical records from [facility].         Code Status:   FULL CODE Family Communication:   Family at Bedside  Disposition Plan:     Observation Telemetry  Time spent:  [facility] Pager [phone]  If 7PM-7AM, please contact night-coverage [email or web address] Password [email or web address] 05/24/2014, 6:00 AM

## 2014-05-24 NOTE — Progress Notes (Signed)
UR Completed Brenda Graves-Bigelow, RN,BSN [phone]  

## 2014-05-24 NOTE — Progress Notes (Signed)
Stroke Team Progress Note  HISTORY Chief Complaint: Slurred speech and left-sided weakness.  HPI: [name] is an 73 y.o. female with a history of leukemia, diabetes mellitus, hypertension, hyperlipidemia, atrial fibrillation, chronic kidney disease stage III and coronary artery disease, presenting with transient slurred speech and left-sided weakness. Patient was last seen well at 10 PM. CBG initially was 66. She was given an amp of D50 and blood sugar increased to 136. Slurred speech and left-sided weakness resolved. CT scan of her head showed no acute intracranial abnormality. Frontal falx mass lesion is noted, likely meningioma. NIH stroke score was 0.  LSN: 10 PM on 05/23/2014   Patient was not administered TPA secondary to deficits rapidly resolved. She was admitted to the Internal Medicine Service for further evaluation and treatment. Neurology is consulting.   SUBJECTIVE No acute events overnight. Family is at the bedside. The patient is alert and conversant, states she is back to her baseline.   OBJECTIVE Most recent Vital Signs: Filed Vitals:   05/24/14 0420 05/24/14 0559 05/24/14 0608 05/24/14 0700  BP: 152/76 153/52  154/66  Pulse: 88 81  84  Temp: 98 F (36.7 C) 98.7 F (37.1 C) 98.7 F (37.1 C)   TempSrc: Oral Oral    Resp: 19 14  18   SpO2: 99% 99%  98%   CBG (last 3)   Recent Labs  05/24/14 0327  GLUCAP 136*    IV Fluid Intake:     MEDICATIONS  . amLODipine  5 mg Oral Daily  . aspirin  325 mg Oral Daily  . enoxaparin (LOVENOX) injection  30 mg Subcutaneous Q24H  . [START ON 05/25/2014] feeding supplement (RESOURCE BREEZE)  1 Container Oral Q24H  . insulin aspart  0-9 Units Subcutaneous TID AC & HS  . metoprolol  50 mg Oral BID  . pantoprazole  40 mg Oral Daily  . potassium chloride  40 mEq Oral Q4H  . simvastatin  20 mg Oral QPM   PRN:    Diet:  General thin liquids Activity:  Bathroom privileges with assistance DVT Prophylaxis:   Lovenox  CLINICALLY SIGNIFICANT STUDIES Basic Metabolic Panel:  Recent Labs Lab 05/19/14 1137 05/19/14 1137 05/24/14 0330  NA  --  139 140  K  --  3.3* 3.4*  CL  --   --  98  CO2  --  26 25  GLUCOSE  --  145* 128*  BUN  --  22.4 24*  CREATININE  --  2.4* 1.83*  CALCIUM  --  7.9* 7.9*  MG 1.3*  --  1.6   Liver Function Tests:  Recent Labs Lab 05/19/14 1137 05/24/14 0330  AST 11 66*  ALT <6 23  ALKPHOS 169* 250*  BILITOT 0.62 0.5  PROT 6.2* 6.5  ALBUMIN 2.4* 2.3*   CBC:  Recent Labs Lab 05/19/14 1137 05/24/14 0413  WBC 7.4 6.1  NEUTROABS 5.8  --   HGB 9.9 Repeated and Verified* 8.5*  HCT 29.8* 25.3*  MCV 85.9 83.0  PLT 156 135*   Coagulation:  Recent Labs Lab 05/24/14 0330  LABPROT 14.1  INR 1.11   Cardiac Enzymes: No results found for this basename: CKTOTAL, CKMB, CKMBINDEX, TROPONINI,  in the last 168 hours Urinalysis:  Recent Labs Lab 05/24/14 0443  COLORURINE YELLOW  LABSPEC 1.015  PHURINE 6.0  GLUCOSEU NEGATIVE  HGBUR NEGATIVE  BILIRUBINUR NEGATIVE  KETONESUR NEGATIVE  PROTEINUR 100*  UROBILINOGEN 0.2  NITRITE NEGATIVE  LEUKOCYTESUR TRACE*   Lipid Panel  Component Value Date/Time   CHOL 140 09/03/2012 0957   TRIG 145.0 09/03/2012 0957   HDL 35.80* 09/03/2012 0957   CHOLHDL 4 09/03/2012 0957   VLDL 29.0 09/03/2012 0957   LDLCALC 75 09/03/2012 0957   HgbA1C  Lab Results  Component Value Date   HGBA1C 8.5* 02/27/2014    Urine Drug Screen:   No results found for this basename: labopia, cocainscrnur, labbenz, amphetmu, thcu, labbarb    Alcohol Level: No results found for this basename: ETH,  in the last 168 hours  CXR  05/24/2014 IMPRESSION: Small bilateral pleural effusions suspected; lungs otherwise clear. Mild cardiomegaly.     CT HEAD  05/24/2014  IMPRESSION: 1. No acute intracranial pathology seen on CT. 2. Scattered small vessel ischemic microangiopathy. 3. Densely calcified 1.6 cm meningioma noted at the anterior falx  cerebri.    MRI/MRA brain:   05/24/2014 IMPRESSION: 1. No acute intracranial abnormality. Moderate for age nonspecific cerebral white matter signal changes, most commonly due to chronic small vessel disease. 2. Diffusely abnormal bone marrow signal in keeping with the "Hematopoietic neoplasm" result from marrow biopsy in March. 3. Small anterior midline meningioma, likely clinically silent. 4. Fairly extensive intracranial large vessel atherosclerosis, but no high-grade intracranial stenosis. No major circle of Willis branch occlusion.    2D Echocardiogram 05/24/14 Mild LVH. EF 55-60%. Abnormal relaxation with elevated filling pressures.   Carotid Doppler pending  EKG 05/24/14 Sinus rhythm. Atrial premature complex. Borderline T abnormalities, anterior leads. Borderline prolonged QT interval. For complete results please see formal report.   Therapy Recommendations   Physical Exam Blood pressure 134/42, pulse 66, temperature 97.9 F (36.6 C), temperature source Oral, resp. rate 18, SpO2 98.00%. Gen: Patient is well developed, ill appearing elderly woman  Cardiac: RRR. S1S2 audible.   Extremities: Cap refill <2 secs. No cyanosis or edema. Pulses 2+ radial and DP. Pulmonary: Respirations regular, symmetric. Lungs clear to auscultation bilat. Abd: Soft, non-tender. BS audible x 4 quadrants.  G/U: Deferred  MS: Alert, follows commands. Oriented to person, place, time, and event. Speech: Speech fluent and non-dysarthric. Able to name and repeat. No alexia or agraphia.   CN: No visual field cut. PERRL. EOMs intact. Facial sensation intact V1-3. No facial droop. Hearing grossly intact. Strong cough. Sternocleidomastoids and trapezius 5/5 strength. Tongue midline, full strength, no atrophy or fasciculations.  Strength: 5/5 in all four extremities proximally and distally.  Sensation: Intact to light touch in all four extremities.  Coordination: No ataxia or dysmetria on FTN or HTS bilat. Gait steady.   Proprioception: Negative Romberg.   Reflexes: 2+ biceps, brachioradialis bilat. 2+ patellar, achilles bilat. No clonus. Downgoing toes bilat.  NIHSS 0  ASSESSMENT/PLAN [name] is a 73 y.o. female with a prior history of leukemia, diabetes mellitus, hypertension, hyperlipidemia, atrial fibrillation, chronic kidney disease stage III and coronary artery disease, presenting with transient slurred speech and left-sided weakness. Patient did not receive IV t-PA due to symptoms resolved. MRI/MRA showed no acute infarct or severe arterial stenosis or occlusion, but did show chronic small vessel ischemic disease. On aspirin 325 mg orally every day prior to admission. Now on aspirin 325 mg orally every day for secondary stroke prevention. Patient's symptoms completely resolved. TIA work up underway.  TIA  Echo shows mild LVH. EF 55-60%.  Carotid US pending  LDL pending, on simvastatin 20 mg daily  HgbA1c pending  Continue ASA 325 mg daily  Hospital day # 0  SIGNED Deirdre Marshell Garfinkel, MSN, ANP-C, CNRN,  MSCS [facility] Stroke Team [phone]  I have personally obtained a history, examined the patient, evaluated imaging results, and formulated the assessment and plan of care. I agree with the above. Antony Contras, MD   To contact Stroke Continuity provider, please refer to [email or web address] After hours, contact General Neurology

## 2014-05-24 NOTE — ED Provider Notes (Signed)
CSN: [id number]     Arrival date & time 05/24/14  [id number] History   First MD Initiated Contact with Patient 05/24/14 [id number]     Chief Complaint  Patient presents with  . Code Stroke    An emergency department physician performed an initial assessment on this suspected stroke patient at 21. (Consider location/radiation/quality/duration/timing/severity/associated sxs/prior Treatment) Patient is a 73 y.o. female presenting with Acute Neurological Problem. The history is provided by the patient and the EMS personnel.  Cerebrovascular Accident This is a new problem. The current episode started 6 to 12 hours ago. The problem occurs constantly. The problem has been rapidly improving. Pertinent negatives include no chest pain, no abdominal pain, no headaches and no shortness of breath. Nothing aggravates the symptoms. Nothing relieves the symptoms. She has tried nothing for the symptoms. The treatment provided significant relief.    Past Medical History  Diagnosis Date  . DIABETES MELLITUS, TYPE II 07/13/2007  . HYPERLIPIDEMIA 12/24/2007  . HYPERTENSION 07/13/2007  . CORONARY ARTERY DISEASE 12/24/2007  . NEPHROLITHIASIS, HX OF 12/24/2007  . Colon polyps 04/29/2010    TUBULAR ADENOMA AND A SERRATED ADENOMA  . Acute pancreatitis   . Unspecified disease of pancreas   . Chronic kidney disease, stage II (mild)    Past Surgical History  Procedure Laterality Date  . Excisional hemorrhoidectomy  unknown  . Abdominal hysterectomy  unknown  . Nasal sinus surgery  unknown  . Cardiac catheterization  2002  . Cataract extraction Bilateral    Family History  Problem Relation Age of Onset  . Cancer Father     throat ca  . COPD Father   . Cancer Sister     uterine ca and breast ca  . Cancer Brother     throat   History  Substance Use Topics  . Smoking status: Former Smoker    Types: Cigarettes    Quit date: 12/08/2000  . Smokeless tobacco: Never Used  . Alcohol Use: No   OB History   Grav Para  Term Preterm Abortions TAB SAB Ect Mult Living                 Review of Systems  Constitutional: Negative for fever.  HENT: Negative for drooling.   Respiratory: Negative for shortness of breath.   Cardiovascular: Negative for chest pain.  Gastrointestinal: Negative for abdominal pain.  Neurological: Negative for headaches.  All other systems reviewed and are negative.     Allergies  Review of patient's allergies indicates no known allergies.  Home Medications   Prior to Admission medications   Medication Sig Start Date End Date Taking? Authorizing Marne Meline  acetaminophen (TYLENOL) 325 MG tablet Take 2 tablets (650 mg total) by mouth every 6 (six) hours as needed for mild pain (or Fever >/= 101). 03/02/14  Yes Christina P Rama, MD  amLODipine (NORVASC) 5 MG tablet Take 5 mg by mouth daily.   Yes Historical Yoko Mcgahee, MD  aspirin 325 MG EC tablet Take 325 mg by mouth daily.   Yes Historical Cledith Kamiya, MD  LORazepam (ATIVAN) 0.5 MG tablet Take 1 tablet (0.5 mg total) by mouth every 6 (six) hours as needed for anxiety. 05/18/14  Yes Marletta Lor, MD  metFORMIN (GLUCOPHAGE) 1000 MG tablet Take 1,000 mg by mouth 2 (two) times daily with a meal.   Yes Historical Sinclair Arrazola, MD  nitroGLYCERIN (NITROSTAT) 0.4 MG SL tablet Place 1 tablet (0.4 mg total) under the tongue every 5 (five) minutes as needed for chest pain.  02/06/14  Yes Ripudeep Krystal Eaton, MD  oxyCODONE-acetaminophen (PERCOCET) 7.5-325 MG per tablet Take 1 tablet by mouth every 4 (four) hours as needed for pain. 05/18/14  Yes Marletta Lor, MD  pantoprazole (PROTONIX) 40 MG tablet Take 1 tablet (40 mg total) by mouth daily. 02/06/14  Yes Ripudeep Krystal Eaton, MD  polyethylene glycol (MIRALAX / GLYCOLAX) packet Take 17 g by mouth daily as needed for moderate constipation. 03/02/14  Yes Venetia Maxon Rama, MD  promethazine (PHENERGAN) 12.5 MG tablet Take 2 tablets (25 mg total) by mouth every 6 (six) hours as needed for nausea. 05/10/13  Yes  Elwyn Lade, PA-C  simvastatin (ZOCOR) 20 MG tablet Take 20 mg by mouth every evening.   Yes Historical Kirtan Sada, MD  metoprolol (LOPRESSOR) 50 MG tablet Take 50 mg by mouth 2 (two) times daily.    Historical Eupha Lobb, MD   BP 153/52  Pulse 81  Temp(Src) 98.7 F (37.1 C) (Oral)  Resp 14  SpO2 99% Physical Exam  Constitutional: She appears well-developed and well-nourished. No distress.  HENT:  Head: Atraumatic.  Mouth/Throat: Oropharynx is clear and moist.  Eyes: Conjunctivae and EOM are normal. Pupils are equal, round, and reactive to light.  Neck: Normal range of motion. No tracheal deviation present.  Cardiovascular: Normal rate, regular rhythm and intact distal pulses.   Pulmonary/Chest: Effort normal and breath sounds normal. No stridor. No respiratory distress.  Abdominal: Soft. Bowel sounds are normal. There is no tenderness. There is no rebound and no guarding.  Musculoskeletal: Normal range of motion. She exhibits no tenderness.  Neurological: She is alert. She has normal reflexes. No cranial nerve deficit.  Skin: Skin is warm and dry.  Psychiatric: She has a normal mood and affect.    ED Course  Procedures (including critical care time) Labs Review Labs Reviewed  COMPREHENSIVE METABOLIC PANEL - Abnormal; Notable for the following:    Potassium 3.4 (*)    Glucose, Bld 128 (*)    BUN 24 (*)    Creatinine, Ser 1.83 (*)    Calcium 7.9 (*)    Albumin 2.3 (*)    AST 66 (*)    Alkaline Phosphatase 250 (*)    GFR calc non Af Amer 26 (*)    GFR calc Af Amer 31 (*)    All other components within normal limits  URINALYSIS, ROUTINE W REFLEX MICROSCOPIC - Abnormal; Notable for the following:    Protein, ur 100 (*)    Leukocytes, UA TRACE (*)    All other components within normal limits  CBC - Abnormal; Notable for the following:    RBC 3.05 (*)    Hemoglobin 8.5 (*)    HCT 25.3 (*)    RDW 15.6 (*)    Platelets 135 (*)    All other components within normal limits   URINE MICROSCOPIC-ADD ON - Abnormal; Notable for the following:    Squamous Epithelial / LPF MANY (*)    Casts GRANULAR CAST (*)    All other components within normal limits  CBG MONITORING, ED - Abnormal; Notable for the following:    Glucose-Capillary 136 (*)    All other components within normal limits  PROTIME-INR  APTT  MAGNESIUM  CBC WITH DIFFERENTIAL  Randolm Idol, ED    Imaging Review Dg Chest 2 View  05/24/2014   CLINICAL DATA:  Weakness.  EXAM: CHEST  2 VIEW  COMPARISON:  Chest radiograph performed 02/28/2014  FINDINGS: The lungs are well-aerated. Small bilateral pleural  effusions are suspected on the lateral view. Pulmonary vascularity is at the upper limits of normal. There is no evidence of focal opacification or pneumothorax.  The heart is mildly enlarged. A right-sided chest port is noted ending about the distal SVC. No acute osseous abnormalities are seen.  IMPRESSION: Small bilateral pleural effusions suspected; lungs otherwise clear. Mild cardiomegaly.   Electronically Signed   By: Garald Balding M.D.   On: 05/24/2014 04:35   Ct Head (brain) Wo Contrast  05/24/2014   CLINICAL DATA:  Code stroke. Right-sided weakness and slurred speech.  EXAM: CT HEAD WITHOUT CONTRAST  TECHNIQUE: Contiguous axial images were obtained from the base of the skull through the vertex without intravenous contrast.  COMPARISON:  None.  FINDINGS: There is no evidence of acute infarction, mass lesion, or intra- or extra-axial hemorrhage on CT.  Scattered periventricular and subcortical white matter change likely reflects small vessel ischemic microangiopathy. An apparent densely calcified 1.6 cm meningioma is noted at the anterior falx cerebri.  The posterior fossa, including the cerebellum, brainstem and fourth ventricle, is within normal limits. The third and lateral ventricles, and basal ganglia are unremarkable in appearance. The cerebral hemispheres demonstrate grossly normal gray-white  differentiation. No midline shift is seen.  There is no evidence of fracture; visualized osseous structures are unremarkable in appearance. The orbits are within normal limits. The paranasal sinuses and mastoid air cells are well-aerated. No significant soft tissue abnormalities are seen.  IMPRESSION: 1. No acute intracranial pathology seen on CT. 2. Scattered small vessel ischemic microangiopathy. 3. Densely calcified 1.6 cm meningioma noted at the anterior falx cerebri.  These results were called by telephone at the time of interpretation on 05/24/2014 at 3:56 AM to Dr. Nicole Kindred, who verbally acknowledged these results.   Electronically Signed   By: Garald Balding M.D.   On: 05/24/2014 03:58     EKG Interpretation   Date/Time:  Wednesday May 24 2014 05:58:42 EDT Ventricular Rate:  72 PR Interval:  190 QRS Duration: 93 QT Interval:  453 QTC Calculation: 496 R Axis:   91 Text Interpretation:  Sinus rhythm Atrial premature complex Borderline T  abnormalities, anterior leads Borderline prolonged QT interval Confirmed  by [facility]  MD, APRIL ([id number]) on 05/24/2014 6:18:00 AM      MDM   Final diagnoses:  TIA (transient ischemic attack)  Acute leukemia  Atrial fibrillation with RVR  Type II or unspecified type diabetes mellitus without mention of complication, not stated as uncontrolled  Unspecified essential hypertension  Hypoglycemia  AML (acute myeloblastic leukemia)  CKD (chronic kidney disease), stage III  Coronary atherosclerosis of unspecified type of vessel, native or graft  Thrombocytopenia    Admit for tia    April Alfonso Patten, MD 05/24/14 [phone]

## 2014-05-24 NOTE — Progress Notes (Signed)
Patient Demographics  [name], is a 73 y.o. female, DOB - 15-Oct-1941, [id number]  Admit date - 05/24/2014   Admitting Physician Theressa Millard, MD  Outpatient Primary MD for the patient is Catherine Cowden, MD  LOS - 0   Chief Complaint  Patient presents with  . Code Stroke           Subjective:    [name] today has, No headache, No chest pain, No abdominal pain - No Nausea, No new weakness tingling or numbness, No Cough - SOB. Back to baseline now per patient and family.    Assessment & Plan     1. TIA- TIA Workup ordered, CT scan of brain Negative for Acute findings, ASA Rx. MRI/MRA of Brain, Carotid US, and 2-D ECHO this AM. Neuro checks, and Check HbA1c and Fasting Lipids.      2. DM type II with Hypoglycemia- Hold oral hypoglycemics, monitor Glucose levels q 1 hr x6 and then q 4 hrs, May have decreased her Glycogen reserves.      3. CAD- stable on ASA Rx. And Metoprolol.      4. HTN- Continue Metoprolol, and Amlodipine Rx, Monitor BPs.      5. PAF/A.Flutter- On ASA Rx. And Metoprolol Rx. Will request neurology to evaluate for long-term anticoagulation using needed.      6. Thrombocytopenia- Due to Cirrhosis Hx, and consequence of Cancer and Cancer rx.      7. ARF on CKD Stage III- now baseline creatinine appears to be close to 2, improved with IV fluids continue to monitor.     8. Hyperlipidemia- On Zocor Rx.      9. AML- Last Chemo in 02/2014 at [facility], hospitalized until 1 week ago.      10. Calcified 1.6 cm meningioma noted on CT scan. Await MRI, neurology following will await their input.       Code Status: Full  Family Communication: Daughter bedside  Disposition Plan: Home   Procedures CT head, MRI MRA  brain, 2-D echo, carotid duplex   Consults   neurology   Medications  Scheduled Meds: . amLODipine  5 mg Oral Daily  . aspirin  325 mg Oral Daily  . enoxaparin (LOVENOX) injection  30 mg Subcutaneous Q24H  . insulin aspart  0-9 Units Subcutaneous 6 times per day  . metoprolol  50 mg Oral BID  . pantoprazole  40 mg Oral Daily  . simvastatin  20 mg Oral QPM   Continuous Infusions:  PRN Meds:.LORazepam, nitroGLYCERIN, oxyCODONE, oxyCODONE-acetaminophen, polyethylene glycol, promethazine  DVT Prophylaxis  Lovenox   Lab Results  Component Value Date   PLT 135* 05/24/2014    Antibiotics     Anti-infectives   None          Objective:   Filed Vitals:   05/24/14 0420 05/24/14 0559 05/24/14 0608 05/24/14 0700  BP: 152/76 153/52  154/66  Pulse: 88 81  84  Temp: 98 F (36.7 C) 98.7 F (37.1 C) 98.7 F (37.1 C)   TempSrc: Oral Oral    Resp: 19 14  18   SpO2: 99% 99%  98%    Wt Readings from Last 3 Encounters:  05/18/14 69.4 kg (153 lb)  03/02/14 83 kg (182  lb 15.7 oz)  02/23/14 80.287 kg (177 lb)     Intake/Output Summary (Last 24 hours) at 05/24/14 0957 Last data filed at 05/24/14 0443  Gross per 24 hour  Intake      0 ml  Output    125 ml  Net   -125 ml     Physical Exam  Awake Alert, Oriented X 3, No new F.N deficits, Normal affect [place].AT,PERRAL Supple Neck,No JVD, No cervical lymphadenopathy appriciated.  Symmetrical Chest wall movement, Good air movement bilaterally, CTAB RRR,No Gallops,Rubs or new Murmurs, No Parasternal Heave +ve B.Sounds, Abd Soft, No tenderness, No organomegaly appriciated, No rebound - guarding or rigidity. No Cyanosis, Clubbing or edema, No new Rash or bruise      Data Review   Micro Results No results found for this or any previous visit (from the past 240 hour(s)).  Radiology Reports Dg Chest 2 View  05/24/2014   CLINICAL DATA:  Weakness.  EXAM: CHEST  2 VIEW  COMPARISON:  Chest radiograph performed 02/28/2014   FINDINGS: The lungs are well-aerated. Small bilateral pleural effusions are suspected on the lateral view. Pulmonary vascularity is at the upper limits of normal. There is no evidence of focal opacification or pneumothorax.  The heart is mildly enlarged. A right-sided chest port is noted ending about the distal SVC. No acute osseous abnormalities are seen.  IMPRESSION: Small bilateral pleural effusions suspected; lungs otherwise clear. Mild cardiomegaly.   Electronically Signed   By: Garald Balding M.D.   On: 05/24/2014 04:35   Ct Head (brain) Wo Contrast  05/24/2014   CLINICAL DATA:  Code stroke. Right-sided weakness and slurred speech.  EXAM: CT HEAD WITHOUT CONTRAST  TECHNIQUE: Contiguous axial images were obtained from the base of the skull through the vertex without intravenous contrast.  COMPARISON:  None.  FINDINGS: There is no evidence of acute infarction, mass lesion, or intra- or extra-axial hemorrhage on CT.  Scattered periventricular and subcortical white matter change likely reflects small vessel ischemic microangiopathy. An apparent densely calcified 1.6 cm meningioma is noted at the anterior falx cerebri.  The posterior fossa, including the cerebellum, brainstem and fourth ventricle, is within normal limits. The third and lateral ventricles, and basal ganglia are unremarkable in appearance. The cerebral hemispheres demonstrate grossly normal gray-white differentiation. No midline shift is seen.  There is no evidence of fracture; visualized osseous structures are unremarkable in appearance. The orbits are within normal limits. The paranasal sinuses and mastoid air cells are well-aerated. No significant soft tissue abnormalities are seen.  IMPRESSION: 1. No acute intracranial pathology seen on CT. 2. Scattered small vessel ischemic microangiopathy. 3. Densely calcified 1.6 cm meningioma noted at the anterior falx cerebri.  These results were called by telephone at the time of interpretation on  05/24/2014 at 3:56 AM to Dr. Nicole Kindred, who verbally acknowledged these results.   Electronically Signed   By: Garald Balding M.D.   On: 05/24/2014 03:58    CBC  Recent Labs Lab 05/19/14 1137 05/24/14 0413  WBC 7.4 6.1  HGB 9.9 Repeated and Verified* 8.5*  HCT 29.8* 25.3*  PLT 156 135*  MCV 85.9 83.0  MCH 28.4 27.9  MCHC 33.1 33.6  RDW 15.4* 15.6*  LYMPHSABS 0.6*  --   MONOABS 0.9  --   EOSABS 0.0  --   BASOSABS 0.0  --     Chemistries   Recent Labs Lab 05/19/14 1137 05/19/14 1137 05/24/14 0330  NA  --  139 140  K  --  3.3* 3.4*  CL  --   --  98  CO2  --  26 25  GLUCOSE  --  145* 128*  BUN  --  22.4 24*  CREATININE  --  2.4* 1.83*  CALCIUM  --  7.9* 7.9*  MG 1.3*  --  1.6  AST  --  11 66*  ALT  --  <6 23  ALKPHOS  --  169* 250*  BILITOT  --  0.62 0.5   ------------------------------------------------------------------------------------------------------------------ CrCl is unknown because both a height and weight (above a minimum accepted value) are required for this calculation. ------------------------------------------------------------------------------------------------------------------ No results found for this basename: HGBA1C,  in the last 72 hours ------------------------------------------------------------------------------------------------------------------ No results found for this basename: CHOL, HDL, LDLCALC, TRIG, CHOLHDL, LDLDIRECT,  in the last 72 hours ------------------------------------------------------------------------------------------------------------------ No results found for this basename: TSH, T4TOTAL, FREET3, T3FREE, THYROIDAB,  in the last 72 hours ------------------------------------------------------------------------------------------------------------------ No results found for this basename: VITAMINB12, FOLATE, FERRITIN, TIBC, IRON, RETICCTPCT,  in the last 72 hours  Coagulation profile  Recent Labs Lab 05/24/14 0330  INR  1.11    No results found for this basename: DDIMER,  in the last 72 hours  Cardiac Enzymes No results found for this basename: CK, CKMB, TROPONINI, MYOGLOBIN,  in the last 168 hours ------------------------------------------------------------------------------------------------------------------ No components found with this basename: POCBNP,      Time Spent in minutes   35   Lala Lund K M.D on 05/24/2014 at 9:57 AM  Between 7am to 7pm - Pager - [phone]  After 7pm go to [email or web address] - password [email or web address]  And look for the night coverage person covering for me after hours  [facility] Office  [phone]   **Disclaimer: This note may have been dictated with voice recognition software. Similar sounding words can inadvertently be transcribed and this note may contain transcription errors which may not have been corrected upon publication of note.**

## 2014-05-24 NOTE — ED Notes (Addendum)
Patient transported to X-ray 

## 2014-05-24 NOTE — Progress Notes (Addendum)
INITIAL NUTRITION ASSESSMENT  DOCUMENTATION CODES Per approved criteria  -Severe malnutrition in the context of chronic illness  Pt meets criteria for SEVERE MALNUTRITION in the context of  CHRONIC ILLNESS as evidenced by 23% weight loss in less than 10 months and estimated energy intake <75% of estimated energy needs for > 1 month.  INTERVENTION: Recommend liberalizing diet to regular Provide Carnation Instant Breakfast BID Provide Resource Breeze once daily Encourage PO intake  NUTRITION DIAGNOSIS: Inadequate oral intake related to poor appetite as evidenced by >12% weight loss in less than 3 months.   Goal: Pt to meet >/= 90% of their estimated nutrition needs   Monitor:  PO intake, weight, labs  Reason for Assessment: Malnutrition Screening Tool  74 y.o. female  Admitting Dx: TIA (transient ischemic attack)  ASSESSMENT: 73 y.o. female with a history of CAD, PAF, HTN, DM2, hyperlipidemia, and CKD Stage II Dz who was brought to the ED with symptoms of Left sided weakness, and dysarthria/ garbled speech which was noticed by her husband at 1130 pm. She had been having problems with hypoglycemia for the past 3-4 days and she reports that sh stopped taking her Metformin . AML- Last Chemo in 02/2014 at [facility], hospitalized until 1 week ago.   Per weight history pt has lost 23% of her body weight within the past 10 months with >12% weight loss within the past 3 months. Discussed weight loss with pt. She reports her appetite has been poor and she has been eating 50-75% compared to what she used to. She reports drinking Carnation Instant Breakfast BID PTA. She has had Ensure in the past but, it causes her to have diarrhea.  Pt reports that her appetite is better and she ate 50-75% of her meals today. She states she is hoping to be discharged tomorrow.  Labs reviewed. Blood glucose ranging  58-136 mg/dL, low hemoglobin, low calcium, elevated BUN and creatinine, decreased GFR  Nutrition  Focused Physical Exam:  Subcutaneous Fat:  Orbital Region: wnl Upper Arm Region: mild wasting Thoracic and Lumbar Region: NA  Muscle:  Temple Region: wnl Clavicle Bone Region: mild wasting Clavicle and Acromion Bone Region: mild wasting Scapular Bone Region: NA Dorsal Hand: mild wasting Patellar Region: moderate wasting Anterior Thigh Region: moderate wasting Posterior Calf Region: moderate/severe wasting  Edema: none noted   Height: Ht Readings from Last 1 Encounters:  05/18/14 5\' 2"  (1.575 m)    Weight: Wt Readings from Last 1 Encounters:  05/18/14 153 lb (69.4 kg)    Ideal Body Weight: 110 lbs  % Ideal Body Weight: 139%  Wt Readings from Last 10 Encounters:  05/18/14 153 lb (69.4 kg)  03/02/14 182 lb 15.7 oz (83 kg)  02/23/14 177 lb (80.287 kg)  02/22/14 175 lb (79.379 kg)  02/09/14 180 lb (81.647 kg)  02/04/14 180 lb (81.647 kg)  01/30/14 180 lb (81.647 kg)  10/31/13 190 lb (86.183 kg)  07/21/13 198 lb (89.812 kg)  04/21/13 210 lb (95.255 kg)    Usual Body Weight: 200 lbs  % Usual Body Weight: 77%  BMI:  There is no weight on file to calculate BMI. 27.98 kg/(m^2) based on most recent weight on 6/11  Estimated Nutritional Needs: Kcal: 1700-1900 Protein: 85-95 grams Fluid: 1.9 L/day  Skin: WDL  Diet Order:   Heart Healthy/Carb Modified  EDUCATION NEEDS: -No education needs identified at this time   Intake/Output Summary (Last 24 hours) at 05/24/14 1357 Last data filed at 05/24/14 0443  Gross per 24  hour  Intake      0 ml  Output    125 ml  Net   -125 ml    Last BM: 6/17  Labs:   Recent Labs Lab 05/19/14 1137 05/19/14 1137 05/24/14 0330  NA  --  139 140  K  --  3.3* 3.4*  CL  --   --  98  CO2  --  26 25  BUN  --  22.4 24*  CREATININE  --  2.4* 1.83*  CALCIUM  --  7.9* 7.9*  MG 1.3*  --  1.6  GLUCOSE  --  145* 128*    CBG (last 3)   Recent Labs  05/24/14 0327 05/24/14 1019 05/24/14 1127  GLUCAP 136* 58* 134*     Scheduled Meds: . amLODipine  5 mg Oral Daily  . aspirin  325 mg Oral Daily  . enoxaparin (LOVENOX) injection  30 mg Subcutaneous Q24H  . insulin aspart  0-9 Units Subcutaneous TID AC & HS  . metoprolol  50 mg Oral BID  . pantoprazole  40 mg Oral Daily  . potassium chloride  40 mEq Oral Q4H  . simvastatin  20 mg Oral QPM    Continuous Infusions:   Past Medical History  Diagnosis Date  . DIABETES MELLITUS, TYPE II 07/13/2007  . HYPERLIPIDEMIA 12/24/2007  . HYPERTENSION 07/13/2007  . CORONARY ARTERY DISEASE 12/24/2007  . NEPHROLITHIASIS, HX OF 12/24/2007  . Colon polyps 04/29/2010    TUBULAR ADENOMA AND A SERRATED ADENOMA  . Acute pancreatitis   . Unspecified disease of pancreas   . Chronic kidney disease, stage II (mild)     Past Surgical History  Procedure Laterality Date  . Excisional hemorrhoidectomy  unknown  . Abdominal hysterectomy  unknown  . Nasal sinus surgery  unknown  . Cardiac catheterization  2002  . Cataract extraction Bilateral     Pryor Ochoa RD, LDN Inpatient Clinical Dietitian Pager: [phone] After Hours Pager: [phone]

## 2014-05-24 NOTE — Consult Note (Signed)
Referring Physician: [facility], A    Chief Complaint: Slurred speech and left-sided weakness.  HPI: [name] is an 73 y.o. female with a history of leukemia, diabetes mellitus, hypertension, hyperlipidemia, atrial fibrillation, chronic kidney disease stage III and coronary artery disease, presenting with transient slurred speech and left-sided weakness. Patient was last seen well at 10 PM. CBG initially was 66. She was given an amp of D50 and blood sugar increased to 136. Slurred speech and left-sided weakness resolved. CT scan of her head showed no acute intracranial abnormality. Frontal falx mass lesion is noted, likely meningioma. NIH stroke score was 0.  LSN: 10 PM on 05/23/2014 tPA Given: No: Deficits rapidly resolved. MRankin: 0  Past Medical History  Diagnosis Date  . DIABETES MELLITUS, TYPE II 07/13/2007  . HYPERLIPIDEMIA 12/24/2007  . HYPERTENSION 07/13/2007  . CORONARY ARTERY DISEASE 12/24/2007  . NEPHROLITHIASIS, HX OF 12/24/2007  . Colon polyps 04/29/2010    TUBULAR ADENOMA AND A SERRATED ADENOMA  . Acute pancreatitis   . Unspecified disease of pancreas   . Chronic kidney disease, stage II (mild)     Family History  Problem Relation Age of Onset  . Cancer Father     throat ca  . COPD Father   . Cancer Sister     uterine ca and breast ca  . Cancer Brother     throat     Medications: I have reviewed the patient's current medications.  ROS: History obtained from the patient  General ROS: negative for - chills, fatigue, fever, night sweats, weight gain or weight loss Psychological ROS: negative for - behavioral disorder, hallucinations, memory difficulties, mood swings or suicidal ideation Ophthalmic ROS: negative for - blurry vision, double vision, eye pain or loss of vision ENT ROS: negative for - epistaxis, nasal discharge, oral lesions, sore throat, tinnitus or vertigo Allergy and Immunology ROS: negative for - hives or itchy/watery eyes Hematological and  Lymphatic ROS: Leukemia, [facility]; recent Port-A-Cath placement Endocrine ROS: negative for - galactorrhea, hair pattern changes, polydipsia/polyuria or temperature intolerance Respiratory ROS: negative for - cough, hemoptysis, shortness of breath or wheezing Cardiovascular ROS: negative for - chest pain, dyspnea on exertion, edema or irregular heartbeat Gastrointestinal ROS: negative for - abdominal pain, diarrhea, hematemesis, nausea/vomiting or stool incontinence Genito-Urinary ROS: negative for - dysuria, hematuria, incontinence or urinary frequency/urgency Musculoskeletal ROS: negative for - joint swelling or muscular weakness Neurological ROS: as noted in HPI Dermatological ROS: negative for rash and skin lesion changes  Physical Examination: There were no vitals taken for this visit.  Neurologic Examination: Mental Status: Alert, oriented, thought content appropriate.  Speech fluent without evidence of aphasia. Able to follow commands without difficulty. Cranial Nerves: II-Visual fields were normal. III/IV/VI-Pupils were equal and reacted. Extraocular movements were full and conjugate.    V/VII-no facial numbness and no facial weakness. VIII-normal. X-normal speech, edentulous. Motor: 5/5 bilaterally with normal tone and bulk Sensory: Normal throughout. Deep Tendon Reflexes: 2+ and symmetric. Plantars: Flexor bilaterally Cerebellar: Normal finger-to-nose testing.  No results found.  Assessment: 73 y.o. female with multiple risk factors for stroke with transient dysarthria and left-sided weakness associated with hypoglycemia. Right subcortical TIA, and possibly acute subcortical ischemic stroke, cannot be ruled out at this point.  Stroke Risk Factors - atrial fibrillation, diabetes mellitus, hyperlipidemia and hypertension  Plan: 1. HgbA1c, fasting lipid panel 2. MRI, MRA  of the brain without contrast 3. PT consult, OT consult, Speech  consult 4. Echocardiogram 5. Carotid  dopplers 6. Prophylactic therapy-Antiplatelet med: Aspirin  7. Risk factor modification 8. Telemetry monitoring   C.R. Nicole Kindred, MD [facility] [phone]  05/24/2014, 3:51 AM

## 2014-05-24 NOTE — ED Notes (Signed)
Pt transported to 4N room 6 by Learta Codding EMT, report given to [place]

## 2014-05-24 NOTE — ED Notes (Signed)
Pt arrives to ED via [facility] with Left side flaccidity and slurred speech starting at 0130 and family called 911 at 0245. Pt LSN at 2200 yesterday. Pt's CBG was 66 upon EMS arrival 1 amp of D50 given en route and pt's CBG increasing and all symptoms resolved. Pt had a porta cath placed yesterday.

## 2014-05-24 NOTE — Code Documentation (Signed)
73 yo wf brought in via [facility] for Lt side weakness & garbled speech. Pt had porta-cath placed yesterday for leukemia treatment.  Per report pt went to bed at 2200 at neuro baseline & was later discovered to have Lt side weakness & garbled speech.  BS 66 for EMS so D50 IV given with resolution of s/s prior to ED arrival.  NIH 0.

## 2014-05-25 DIAGNOSIS — C9200 Acute myeloblastic leukemia, not having achieved remission: Secondary | ICD-10-CM

## 2014-05-25 DIAGNOSIS — M479 Spondylosis, unspecified: Secondary | ICD-10-CM

## 2014-05-25 DIAGNOSIS — E119 Type 2 diabetes mellitus without complications: Secondary | ICD-10-CM

## 2014-05-25 DIAGNOSIS — M6281 Muscle weakness (generalized): Secondary | ICD-10-CM

## 2014-05-25 LAB — LIPID PANEL
CHOLESTEROL: 153 mg/dL (ref 0–200)
HDL: 27 mg/dL — ABNORMAL LOW (ref >39)
LDL Cholesterol: 86 mg/dL (ref 0–99)
Total CHOL/HDL Ratio: 5.7 RATIO
Triglycerides: 198 mg/dL — ABNORMAL HIGH (ref <150)
VLDL: 40 mg/dL (ref 0–40)

## 2014-05-25 LAB — COMPREHENSIVE METABOLIC PANEL
ALBUMIN: 2.1 g/dL — AB (ref 3.5–5.2)
ALK PHOS: 209 U/L — AB (ref 39–117)
ALT: 10 U/L (ref 0–35)
AST: 20 U/L (ref 0–37)
BILIRUBIN TOTAL: 0.4 mg/dL (ref 0.3–1.2)
BUN: 26 mg/dL — ABNORMAL HIGH (ref 6–23)
CHLORIDE: 102 meq/L (ref 96–112)
CO2: 24 mEq/L (ref 19–32)
Calcium: 8 mg/dL — ABNORMAL LOW (ref 8.4–10.5)
Creatinine, Ser: 1.83 mg/dL — ABNORMAL HIGH (ref 0.50–1.10)
GFR calc Af Amer: 31 mL/min — ABNORMAL LOW (ref >90)
GFR calc non Af Amer: 26 mL/min — ABNORMAL LOW (ref >90)
Glucose, Bld: 197 mg/dL — ABNORMAL HIGH (ref 70–99)
Potassium: 4.8 mEq/L (ref 3.7–5.3)
SODIUM: 142 meq/L (ref 137–147)
Total Protein: 5.8 g/dL — ABNORMAL LOW (ref 6.0–8.3)

## 2014-05-25 LAB — GLUCOSE, CAPILLARY
GLUCOSE-CAPILLARY: 225 mg/dL — AB (ref 70–99)
Glucose-Capillary: 268 mg/dL — ABNORMAL HIGH (ref 70–99)

## 2014-05-25 LAB — HEMOGLOBIN A1C
HEMOGLOBIN A1C: 6.6 % — AB (ref <5.7)
Mean Plasma Glucose: 143 mg/dL — ABNORMAL HIGH (ref <117)

## 2014-05-25 MED ORDER — FREESTYLE SYSTEM KIT: 1.0000 | PACK | Freq: Three times a day (TID) | Status: DC

## 2014-05-25 NOTE — Progress Notes (Signed)
Stroke Team Progress Note  HISTORY Chief Complaint: Slurred speech and left-sided weakness.  HPI: [name] is an 73 y.o. female with a history of leukemia, diabetes mellitus, hypertension, hyperlipidemia, atrial fibrillation, chronic kidney disease stage III and coronary artery disease, presenting with transient slurred speech and left-sided weakness. Patient was last seen well at 10 PM on 05/23/2014. CBG initially was 66. She was given an amp of D50 and blood sugar increased to 136. Slurred speech and left-sided weakness resolved. CT scan of her head showed no acute intracranial abnormality. Frontal falx mass lesion is noted, likely meningioma. NIH stroke score was 0. Patient was not administered TPA secondary to deficits rapidly resolved. She was admitted to the Internal Medicine Service for further evaluation and treatment. Neurology is consulting.   SUBJECTIVE No family at bedside. Asked MD to look at [facility].  OBJECTIVE Most recent Vital Signs: Filed Vitals:   05/24/14 2123 05/25/14 0101 05/25/14 0507 05/25/14 0958  BP: 150/52 118/54 123/50 135/75  Pulse: 78 71 69 69  Temp: 98.4 F (36.9 C) 98.4 F (36.9 C) 98 F (36.7 C) 97.9 F (36.6 C)  TempSrc: Oral Oral Oral Oral  Resp: 20 20 20 20   Height:      Weight:      SpO2: 100% 100% 100% 97%   CBG (last 3)   Recent Labs  05/24/14 1620 05/24/14 2111 05/25/14 0642  GLUCAP 107* 141* 268*    IV Fluid Intake:     MEDICATIONS  . amLODipine  5 mg Oral Daily  . aspirin  325 mg Oral Daily  . enoxaparin (LOVENOX) injection  30 mg Subcutaneous Q24H  . feeding supplement (RESOURCE BREEZE)  1 Container Oral Q24H  . insulin aspart  0-9 Units Subcutaneous TID AC & HS  . magnesium oxide  400 mg Oral BID  . metoprolol  50 mg Oral BID  . pantoprazole  40 mg Oral Daily  . simvastatin  20 mg Oral QPM   PRN:  LORazepam, nitroGLYCERIN, oxyCODONE, oxyCODONE-acetaminophen, polyethylene glycol, promethazine  Diet:  General thin  liquids Activity:  Bathroom privileges with assistance DVT Prophylaxis:  Lovenox  CLINICALLY SIGNIFICANT STUDIES Basic Metabolic Panel:  Recent Labs Lab 05/19/14 1137  05/24/14 0330 05/25/14 0612  NA  --   < > 140 142  K  --   < > 3.4* 4.8  CL  --   --  98 102  CO2  --   < > 25 24  GLUCOSE  --   < > 128* 197*  BUN  --   < > 24* 26*  CREATININE  --   < > 1.83* 1.83*  CALCIUM  --   < > 7.9* 8.0*  MG 1.3*  --  1.6  --   < > = values in this interval not displayed. Liver Function Tests:   Recent Labs Lab 05/24/14 0330 05/25/14 0612  AST 66* 20  ALT 23 10  ALKPHOS 250* 209*  BILITOT 0.5 0.4  PROT 6.5 5.8*  ALBUMIN 2.3* 2.1*   CBC:   Recent Labs Lab 05/19/14 1137 05/24/14 0413  WBC 7.4 6.1  NEUTROABS 5.8 5.1  HGB 9.9 Repeated and Verified* 8.5*  HCT 29.8* 25.3*  MCV 85.9 83.0  PLT 156 135*   Coagulation:   Recent Labs Lab 05/24/14 0330  LABPROT 14.1  INR 1.11   Cardiac Enzymes: No results found for this basename: CKTOTAL, CKMB, CKMBINDEX, TROPONINI,  in the last 168 hours Urinalysis:   Recent Labs  Lab 05/24/14 0443  COLORURINE YELLOW  LABSPEC 1.015  PHURINE 6.0  GLUCOSEU NEGATIVE  HGBUR NEGATIVE  BILIRUBINUR NEGATIVE  KETONESUR NEGATIVE  PROTEINUR 100*  UROBILINOGEN 0.2  NITRITE NEGATIVE  LEUKOCYTESUR TRACE*   Lipid Panel    Component Value Date/Time   CHOL 153 05/25/2014 0612   TRIG 198* 05/25/2014 0612   HDL 27* 05/25/2014 0612   CHOLHDL 5.7 05/25/2014 0612   VLDL 40 05/25/2014 0612   LDLCALC 86 05/25/2014 0612   HgbA1C  Lab Results  Component Value Date   HGBA1C 6.4* 05/24/2014    Urine Drug Screen:   No results found for this basename: labopia,  cocainscrnur,  labbenz,  amphetmu,  thcu,  labbarb    Alcohol Level: No results found for this basename: ETH,  in the last 168 hours   CT HEAD 05/24/2014  IMPRESSION: 1. No acute intracranial pathology seen on CT. 2. Scattered small vessel ischemic microangiopathy. 3. Densely calcified 1.6  cm meningioma noted at the anterior falx cerebri.    MRI/MRA brain 05/24/2014 IMPRESSION: 1. No acute intracranial abnormality. Moderate for age nonspecific cerebral white matter signal changes, most commonly due to chronic small vessel disease. 2. Diffusely abnormal bone marrow signal in keeping with the "Hematopoietic neoplasm" result from marrow biopsy in March. 3. Small anterior midline meningioma, likely clinically silent. 4. Fairly extensive intracranial large vessel atherosclerosis, but no high-grade intracranial stenosis. No major circle of Willis branch occlusion.    2D Echocardiogram 05/24/14 Mild LVH. EF 55-60%. Abnormal relaxation with elevated filling pressures.   Carotid Doppler No evidence of hemodynamically significant internal carotid artery stenosis. Vertebral artery flow is antegrade.   CXR 05/24/2014 IMPRESSION: Small bilateral pleural effusions suspected; lungs otherwise clear. Mild cardiomegaly.     EKG 05/24/14 Sinus rhythm. Atrial premature complex. Borderline T abnormalities, anterior leads. Borderline prolonged QT interval. For complete results please see formal report.   Therapy Recommendations   Physical Exam Blood pressure 135/75, pulse 69, temperature 97.9 F (36.6 C), temperature source Oral, resp. rate 20, height 5\' 2"  (1.575 m), weight 69.4 kg (153 lb), SpO2 97.00%. Gen: Patient is well developed, ill appearing elderly woman  Cardiac: RRR. S1S2 audible.   Extremities: Cap refill <2 secs. No cyanosis or edema. Pulses 2+ radial and DP. Pulmonary: Respirations regular, symmetric. Lungs clear to auscultation bilat. Abd: Soft, non-tender. BS audible x 4 quadrants.  G/U: Deferred  MS: Alert, follows commands. Oriented to person, place, time, and event. Speech: Speech fluent and non-dysarthric. Able to name and repeat. No alexia or agraphia.   CN: No visual field cut. PERRL. EOMs intact. Facial sensation intact V1-3. No facial droop. Hearing grossly intact. Strong  cough. Sternocleidomastoids and trapezius 5/5 strength. Tongue midline, full strength, no atrophy or fasciculations.  Strength: 5/5 in all four extremities proximally and distally.  Sensation: Intact to light touch in all four extremities.  Coordination: No ataxia or dysmetria on FTN or HTS bilat. Gait steady.  Proprioception: Negative Romberg.   Reflexes: 2+ biceps, brachioradialis bilat. 2+ patellar, achilles bilat. No clonus. Downgoing toes bilat.  NIHSS 0  ASSESSMENT [name] is a 73 y.o. female with a prior history of leukemia, diabetes mellitus, hypertension, hyperlipidemia, atrial fibrillation, chronic kidney disease stage III and coronary artery disease, presenting with transient slurred speech and left-sided weakness. Patient did not receive IV t-PA due to symptoms resolved. MRI/MRA showed no acute infarct or severe arterial stenosis or occlusion, but did show chronic small vessel ischemic disease. Dx:  Alfonso Patten  brain TIA in the setting of hypoglycemia. On aspirin 325 mg orally every day prior to admission. Now on aspirin 325 mg orally every day for secondary stroke prevention. Patient's symptoms completely resolved. TIA work up underway.  atrial fibrillation hypertension Hyperlipidemia, LDL 86, on simvastatin 20 mg daily PTA, now on zocor 20 mg daily, goal LDL < 100 (< 70 for diabetics)  Diabetes, HgbA1c 6.4, goal < 7.0  CAD  Thrombocytopenia- Due to Cirrhosis Hx, and consequence of Cancer and Cancer rx.  ARF on CKD Stage III- now baseline creatinine appears to be close to 2  AML- Last Chemo in 02/2014 at [facility], hospitalized until 1 week ago.   Calcified 1.6 cm meningioma   PLAN  Continue ASA 325 mg daily for secondary stroke prevention, due to high CHADsVasc and atrial fibrillation would typically consider anticoagulation; not an anticoagulation candidate secondary to ongoing chemo and leukemia  Meningioma - no further evaluation or treatment indicated  No Therapy  needs  No further stroke workup indicated.  Patient has a 10-15% risk of having another stroke over the next year, the highest risk is within 2 weeks of the most recent stroke/TIA (risk of having a stroke following a stroke or TIA is the same).  Ongoing risk factor control by Primary Care Physician  Stroke Service will sign off. Please call should any needs arise.  Follow up with Dr. Erlinda Hong, [facility], in 2 months.  Hospital day # 1  SIGNED Burnetta Sabin, MSN, RN, ANVP-BC, ANP-BC, [facility] Pager: [phone] 05/25/2014 10:05 AM   I have personally obtained a history, examined the patient, evaluated imaging results, and formulated the assessment and plan of care. I agree with the above.  Antony Contras, MD   To contact Stroke Continuity provider, please refer to [email or web address] After hours, contact General Neurology

## 2014-05-25 NOTE — Progress Notes (Signed)
Patient is d/ced home this afternoon, assessments remain unchanged as at now. Prescription for Glucose monitoring kit was given but patient made a concern that she did not need it. Education given to patient and husband on the need to follow up after d/c.

## 2014-05-25 NOTE — Discharge Instructions (Signed)
Follow with Primary MD [name], MD in 3 days   Get CBC, CMP  checked  by Primary MD next visit.    Activity: As tolerated with Full fall precautions use walker/cane & assistance as needed   Disposition Home     Diet: Heart Healthy low carb.  Accuchecks 4 times/day, Once in AM empty stomach and then before each meal. Log in all results and show them to your Prim.MD in 3 days. If any glucose reading is under 80 or above 300 call your Prim MD immidiately. Follow Low glucose instructions for glucose under 80 as instructed.   For Heart failure patients - Check your Weight same time everyday, if you gain over 2 pounds, or you develop in leg swelling, experience more shortness of breath or chest pain, call your Primary MD immediately. Follow Cardiac Low Salt Diet and 1.8 lit/day fluid restriction.   On your next visit with her primary care physician please Get Medicines reviewed and adjusted.  Please request your Prim.MD to go over all Hospital Tests and Procedure/Radiological results at the follow up, please get all Hospital records sent to your Prim MD by signing hospital release before you go home.   If you experience worsening of your admission symptoms, develop shortness of breath, life threatening emergency, suicidal or homicidal thoughts you must seek medical attention immediately by calling 911 or calling your MD immediately  if symptoms less severe.  You Must read complete instructions/literature along with all the possible adverse reactions/side effects for all the Medicines you take and that have been prescribed to you. Take any new Medicines after you have completely understood and accpet all the possible adverse reactions/side effects.   Do not drive and provide baby sitting services if your were admitted for syncope or siezures until you have seen by Primary MD or a Neurologist and advised to do so again.  Do not drive when taking Pain medications.    Do not  take more than prescribed Pain, Sleep and Anxiety Medications  Special Instructions: If you have smoked or chewed Tobacco  in the last 2 yrs please stop smoking, stop any regular Alcohol  and or any Recreational drug use.  Wear Seat belts while driving.   Please note  You were cared for by a hospitalist during your hospital stay. If you have any questions about your discharge medications or the care you received while you were in the hospital after you are discharged, you can call the unit and asked to speak with the hospitalist on call if the hospitalist that took care of you is not available. Once you are discharged, your primary care physician will handle any further medical issues. Please note that NO REFILLS for any discharge medications will be authorized once you are discharged, as it is imperative that you return to your primary care physician (or establish a relationship with a primary care physician if you do not have one) for your aftercare needs so that they can reassess your need for medications and monitor your lab values.

## 2014-05-25 NOTE — Discharge Summary (Signed)
[name], is a 73 y.o. female  DOB [birth year]  MRN [id number].  Admission date:  05/24/2014  Admitting Physician  Theressa Millard, MD  Discharge Date:  05/25/2014   Primary MD  [name], MD  Recommendations for primary care physician for things to follow:   Monitor glycemic control and secondary factors for stroke   Admission Diagnosis  Type II or unspecified type diabetes mellitus without mention of complication, not stated as uncontrolled [250.00] Unspecified essential hypertension [401.9] Coronary atherosclerosis of unspecified type of vessel, native or graft [414.00] TIA (transient ischemic attack) [435.9] Acute leukemia [208.00] Anemia [285.9] Hypoglycemia [251.2] Thrombocytopenia [287.5] AML (acute myeloblastic leukemia) [205.00] CKD (chronic kidney disease), stage III [585.3] Atrial fibrillation with RVR [427.31]   Discharge Diagnosis  Type II or unspecified type diabetes mellitus without mention of complication, not stated as uncontrolled [250.00] Unspecified essential hypertension [401.9] Coronary atherosclerosis of unspecified type of vessel, native or graft [414.00] TIA (transient ischemic attack) [435.9] Acute leukemia [208.00] Anemia [285.9] Hypoglycemia [251.2] Thrombocytopenia [287.5] AML (acute myeloblastic leukemia) [205.00] CKD (chronic kidney disease), stage III [585.3] Atrial fibrillation with RVR [427.31]   Hypoglycemia versus TIA  Principal Problem:   TIA (transient ischemic attack) Active Problems:   DIABETES MELLITUS, TYPE II   HYPERLIPIDEMIA   HYPERTENSION   CAD, non obstructive in '02   PAF- flutter   Thrombocytopenia   Anemia   Hypoglycemia   CKD (chronic kidney disease) stage 3, GFR 30-59 ml/min   AML (acute myeloblastic leukemia)      Past Medical History    Diagnosis Date  . DIABETES MELLITUS, TYPE II 07/13/2007  . HYPERLIPIDEMIA 12/24/2007  . HYPERTENSION 07/13/2007  . CORONARY ARTERY DISEASE 12/24/2007  . NEPHROLITHIASIS, HX OF 12/24/2007  . Colon polyps 04/29/2010    TUBULAR ADENOMA AND A SERRATED ADENOMA  . Acute pancreatitis   . Unspecified disease of pancreas   . Chronic kidney disease, stage II (mild)     Past Surgical History  Procedure Laterality Date  . Excisional hemorrhoidectomy  unknown  . Abdominal hysterectomy  unknown  . Nasal sinus surgery  unknown  . Cardiac catheterization  2002  . Cataract extraction Bilateral      Discharge Condition: Stable   Follow UP  Follow-up Information   Follow up with Xu,Jindong, MD. Schedule an appointment as soon as possible for a visit in 2 months. (Stroke Clinic)    Specialty:  Neurology   Contact information:   [address] [place] [address] [phone]       Follow up with [name], MD. Schedule an appointment as soon as possible for a visit in 3 days.   Specialty:  Internal Medicine   Contact information:   [place]  [address] [phone]       Follow up with Consuella Lose, C, MD. Schedule an appointment as soon as possible for a visit in 1 week. (Calcified meningioma)    Specialty:  Neurosurgery   Contact information:   [phone]  [place], [address] [place] [address] [phone]         Discharge Instructions  and  Discharge Medications          Discharge Instructions   Discharge instructions    Complete by:  As directed   Follow with Primary MD [name], MD in 3 days   Get CBC, CMP  checked  by Primary MD next visit.    Activity: As tolerated with Full fall precautions use walker/cane & assistance as needed   Disposition Home     Diet: Heart Healthy low carb.  Accuchecks 4 times/day, Once in AM empty stomach and then before each meal. Log in all results and  show them to your Prim.MD in 3 days. If any glucose reading is under 80 or above 300 call your Prim MD immidiately. Follow Low glucose instructions for glucose under 80 as instructed.   For Heart failure patients - Check your Weight same time everyday, if you gain over 2 pounds, or you develop in leg swelling, experience more shortness of breath or chest pain, call your Primary MD immediately. Follow Cardiac Low Salt Diet and 1.8 lit/day fluid restriction.   On your next visit with her primary care physician please Get Medicines reviewed and adjusted.  Please request your Prim.MD to go over all Hospital Tests and Procedure/Radiological results at the follow up, please get all Hospital records sent to your Prim MD by signing hospital release before you go home.   If you experience worsening of your admission symptoms, develop shortness of breath, life threatening emergency, suicidal or homicidal thoughts you must seek medical attention immediately by calling 911 or calling your MD immediately  if symptoms less severe.  You Must read complete instructions/literature along with all the possible adverse reactions/side effects for all the Medicines you take and that have been prescribed to you. Take any new Medicines after you have completely understood and accpet all the possible adverse reactions/side effects.   Do not drive and provide baby sitting services if your were admitted for syncope or siezures until you have seen by Primary MD or a Neurologist and advised to do so again.  Do not drive when taking Pain medications.    Do not take more than prescribed Pain, Sleep and Anxiety Medications  Special Instructions: If you have smoked or chewed Tobacco  in the last 2 yrs please stop smoking, stop any regular Alcohol  and or any Recreational drug use.  Wear Seat belts while driving.   Please note  You were cared for by a hospitalist during your hospital stay. If you have any questions  about your discharge medications or the care you received while you were in the hospital after you are discharged, you can call the unit and asked to speak with the hospitalist on call if the hospitalist that took care of you is not available. Once you are discharged, your primary care physician will handle any further medical issues. Please note that NO REFILLS for any discharge medications will be authorized once you are discharged, as it is imperative that you return to your primary care physician (or establish a relationship with a primary care physician if you do not have one) for your aftercare needs so that they can reassess your need for medications and monitor your lab values.  Follow with Primary MD [name], MD in 3 days   Get CBC, CMP  checked  by Primary MD next visit.    Activity:  As tolerated with Full fall precautions use walker/cane & assistance as needed   Disposition Home     Diet: Heart Healthy low carb.  Accuchecks 4 times/day, Once in AM empty stomach and then before each meal. Log in all results and show them to your Prim.MD in 3 days. If any glucose reading is under 80 or above 300 call your Prim MD immidiately. Follow Low glucose instructions for glucose under 80 as instructed.   For Heart failure patients - Check your Weight same time everyday, if you gain over 2 pounds, or you develop in leg swelling, experience more shortness of breath or chest pain, call your Primary MD immediately. Follow Cardiac Low Salt Diet and 1.8 lit/day fluid restriction.   On your next visit with her primary care physician please Get Medicines reviewed and adjusted.  Please request your Prim.MD to go over all Hospital Tests and Procedure/Radiological results at the follow up, please get all Hospital records sent to your Prim MD by signing hospital release before you go home.   If you experience worsening of your admission symptoms, develop shortness of breath, life  threatening emergency, suicidal or homicidal thoughts you must seek medical attention immediately by calling 911 or calling your MD immediately  if symptoms less severe.  You Must read complete instructions/literature along with all the possible adverse reactions/side effects for all the Medicines you take and that have been prescribed to you. Take any new Medicines after you have completely understood and accpet all the possible adverse reactions/side effects.   Do not drive and provide baby sitting services if your were admitted for syncope or siezures until you have seen by Primary MD or a Neurologist and advised to do so again.  Do not drive when taking Pain medications.    Do not take more than prescribed Pain, Sleep and Anxiety Medications  Special Instructions: If you have smoked or chewed Tobacco  in the last 2 yrs please stop smoking, stop any regular Alcohol  and or any Recreational drug use.  Wear Seat belts while driving.   Please note  You were cared for by a hospitalist during your hospital stay. If you have any questions about your discharge medications or the care you received while you were in the hospital after you are discharged, you can call the unit and asked to speak with the hospitalist on call if the hospitalist that took care of you is not available. Once you are discharged, your primary care physician will handle any further medical issues. Please note that NO REFILLS for any discharge medications will be authorized once you are discharged, as it is imperative that you return to your primary care physician (or establish a relationship with a primary care physician if you do not have one) for your aftercare needs so that they can reassess your need for medications and monitor your lab values.     Increase activity slowly    Complete by:  As directed             Medication List    STOP taking these medications       metFORMIN 1000 MG tablet  Commonly known as:   GLUCOPHAGE      TAKE these medications       acetaminophen 325 MG tablet  Commonly known as:  TYLENOL  Take 2 tablets (650 mg total) by mouth every 6 (six) hours as needed for mild pain (or Fever >/= 101).     amLODipine 5  MG tablet  Commonly known as:  NORVASC  Take 5 mg by mouth daily.     aspirin 325 MG EC tablet  Take 325 mg by mouth daily.     glucose monitoring kit monitoring kit  1 each by Does not apply route 4 (four) times daily - after meals and at bedtime. 1 month Diabetic Testing Supplies for QAC-QHS accuchecks.Any brand OK     LORazepam 0.5 MG tablet  Commonly known as:  ATIVAN  Take 1 tablet (0.5 mg total) by mouth every 6 (six) hours as needed for anxiety.     metoprolol 50 MG tablet  Commonly known as:  LOPRESSOR  Take 50 mg by mouth 2 (two) times daily.     nitroGLYCERIN 0.4 MG SL tablet  Commonly known as:  NITROSTAT  Place 1 tablet (0.4 mg total) under the tongue every 5 (five) minutes as needed for chest pain.     oxyCODONE-acetaminophen 7.5-325 MG per tablet  Commonly known as:  PERCOCET  Take 1 tablet by mouth every 4 (four) hours as needed for pain.     pantoprazole 40 MG tablet  Commonly known as:  PROTONIX  Take 1 tablet (40 mg total) by mouth daily.     polyethylene glycol packet  Commonly known as:  MIRALAX / GLYCOLAX  Take 17 g by mouth daily as needed for moderate constipation.     promethazine 12.5 MG tablet  Commonly known as:  PHENERGAN  Take 2 tablets (25 mg total) by mouth every 6 (six) hours as needed for nausea.     simvastatin 20 MG tablet  Commonly known as:  ZOCOR  Take 20 mg by mouth every evening.          Diet and Activity recommendation: See Discharge Instructions above   Consults obtained - neurology   Major procedures and Radiology Reports - PLEASE review detailed and final reports for all details, in brief -    Echo  - Left ventricle: The cavity size was normal. There was mild concentric hypertrophy.  Systolic function was normal. The estimated ejection fraction was in the range of 55% to 60%. Wall motion was normal; there were no regional wall motion abnormalities. Doppler parameters are consistent with abnormal left ventricular relaxation (grade 1 diastolic dysfunction). Doppler parameters are consistent with elevated ventricular end-diastolic filling pressure. - Aortic valve: There was mild stenosis. There was no regurgitation. Valve area (VTI): 1.66 cm^2. Valve area (Vmax): 1.64 cm^2. - Mitral valve: Mildly thickened, moderately calcified tip of the anterior leaflet. There was no regurgitation. - Left atrium: The atrium was mildly dilated. - Right ventricle: Systolic function was normal. - Pericardium, extracardiac: A small pericardial effusion was identified. Features were not consistent with tamponade physiology.  Impressions:  - Abnormal relaxation with elevated filling pressures. Normal LVEF. RVSP can&'t be estimated as there is no tricuspid regurgitation.    Carotids  Preliminary report: Bilateral: 1-39% ICA stenosis. Vertebral artery flow is antegrade.   Dg Chest 2 View  05/24/2014   CLINICAL DATA:  Weakness.  EXAM: CHEST  2 VIEW  COMPARISON:  Chest radiograph performed 02/28/2014  FINDINGS: The lungs are well-aerated. Small bilateral pleural effusions are suspected on the lateral view. Pulmonary vascularity is at the upper limits of normal. There is no evidence of focal opacification or pneumothorax.  The heart is mildly enlarged. A right-sided chest port is noted ending about the distal SVC. No acute osseous abnormalities are seen.  IMPRESSION: Small bilateral pleural effusions suspected; lungs  otherwise clear. Mild cardiomegaly.   Electronically Signed   By: Garald Balding M.D.   On: 05/24/2014 04:35   Ct Head (brain) Wo Contrast  05/24/2014   CLINICAL DATA:  Code stroke. Right-sided weakness and slurred speech.  EXAM: CT HEAD WITHOUT CONTRAST  TECHNIQUE: Contiguous  axial images were obtained from the base of the skull through the vertex without intravenous contrast.  COMPARISON:  None.  FINDINGS: There is no evidence of acute infarction, mass lesion, or intra- or extra-axial hemorrhage on CT.  Scattered periventricular and subcortical white matter change likely reflects small vessel ischemic microangiopathy. An apparent densely calcified 1.6 cm meningioma is noted at the anterior falx cerebri.  The posterior fossa, including the cerebellum, brainstem and fourth ventricle, is within normal limits. The third and lateral ventricles, and basal ganglia are unremarkable in appearance. The cerebral hemispheres demonstrate grossly normal gray-white differentiation. No midline shift is seen.  There is no evidence of fracture; visualized osseous structures are unremarkable in appearance. The orbits are within normal limits. The paranasal sinuses and mastoid air cells are well-aerated. No significant soft tissue abnormalities are seen.  IMPRESSION: 1. No acute intracranial pathology seen on CT. 2. Scattered small vessel ischemic microangiopathy. 3. Densely calcified 1.6 cm meningioma noted at the anterior falx cerebri.  These results were called by telephone at the time of interpretation on 05/24/2014 at 3:56 AM to Dr. Nicole Kindred, who verbally acknowledged these results.   Electronically Signed   By: Garald Balding M.D.   On: 05/24/2014 03:58   Mri Brain Without Contrast  05/24/2014   CLINICAL DATA:  73 year old female with slurred speech and left-sided weakness. Initial encounter. Renal insufficiency (GFR 31) precludes IV contrast at this time. Suspected anterior midline meningioma on recent head CT.  "Hematopoietic neoplasm" on bone marrow biopsy in March.  EXAM: MRI HEAD WITHOUT CONTRAST  MRA HEAD WITHOUT CONTRAST  TECHNIQUE: Multiplanar, multiecho pulse sequences of the brain and surrounding structures were obtained without intravenous contrast. Angiographic images of the head were  obtained using MRA technique without contrast.  COMPARISON:  Head CT without contrast 05/24/2014. Cervical spine MRI 02/24/2014.  FINDINGS: MRI HEAD FINDINGS  Oval 16 x 13 x 14 mm (AP by transverse by CC) gray matter a isointense extra-axial lesion in the midline anteriorly is compatible with a small benign meningioma. Minor associated mass effect on the adjacent anterior frontal lobes with no cerebral edema. As seen by CT, this is densely mineralized.  Cerebral volume is within normal limits for age. No restricted diffusion to suggest acute infarction. No midline shift, mass effect, evidence of mass lesion, ventriculomegaly, extra-axial collection or acute intracranial hemorrhage. Cervicomedullary junction and pituitary are within normal limits. Major intracranial vascular flow voids are preserved.  Patchy cerebral white matter T2 and FLAIR hyperintensity, moderate for age but nonspecific. Mild involvement in the pons. No cortical encephalomalacia. Mild involvement in both alum I a. Cerebellum within normal limits.  Visible internal auditory structures appear normal. Visualized scalp soft tissues are within normal limits. Postoperative changes to the globes. Trace left mastoid fluid, significance doubtful. Other Visualized paranasal sinuses and mastoids are clear.  Diffusely abnormal bone marrow signal, mildly progressed since March, with a more abnormal appearance of the clivus today.  MRA HEAD FINDINGS  Antegrade flow in the posterior circulation. Dominant distal right vertebral artery. It non dominant and irregular distal left vertebral artery, but with no focal stenosis. Patent vertebrobasilar junction. Normal right PICA origin. Patent AICA origins (the left is duplicated). The  basilar artery is irregular in keeping with atherosclerosis but no significant stenosis occurs. SCA and PCA origins are within normal limits. Posterior communicating arteries are diminutive or absent. Bilateral PCA branches are within  normal limits.  Antegrade flow in both ICA siphons. Extensive irregularity of the cavernous and supra clinoid segments in keeping with atherosclerosis. Mild to moderate tandem stenoses on the left. Mild stenoses on the right. Ophthalmic artery origins are within normal limits.  Carotid termini are patent. MCA and ACA origins are patent. There is mild left MCA origin stenosis.  Mild ectasia of the anterior communicating artery without discrete aneurysm. Other visualized bilateral ACA branches are within normal limits. Visualized bilateral MCA branches are within normal limits. Incidental simple applied branching pattern on the right.  IMPRESSION: 1. No acute intracranial abnormality. Moderate for age nonspecific cerebral white matter signal changes, most commonly due to chronic small vessel disease. 2. Diffusely abnormal bone marrow signal in keeping with the "Hematopoietic neoplasm" result from marrow biopsy in March. 3. Small anterior midline meningioma, likely clinically silent. 4. Fairly extensive intracranial large vessel atherosclerosis, but no high-grade intracranial stenosis. No major circle of Willis branch occlusion.   Electronically Signed   By: Lars Pinks M.D.   On: 05/24/2014 10:13   Mr Jodene [name] Head/brain Wo Cm  05/24/2014   CLINICAL DATA:  73 year old female with slurred speech and left-sided weakness. Initial encounter. Renal insufficiency (GFR 31) precludes IV contrast at this time. Suspected anterior midline meningioma on recent head CT.  "Hematopoietic neoplasm" on bone marrow biopsy in March.  EXAM: MRI HEAD WITHOUT CONTRAST  MRA HEAD WITHOUT CONTRAST  TECHNIQUE: Multiplanar, multiecho pulse sequences of the brain and surrounding structures were obtained without intravenous contrast. Angiographic images of the head were obtained using MRA technique without contrast.  COMPARISON:  Head CT without contrast 05/24/2014. Cervical spine MRI 02/24/2014.  FINDINGS: MRI HEAD FINDINGS  Oval 16 x 13 x 14 mm (AP  by transverse by CC) gray matter a isointense extra-axial lesion in the midline anteriorly is compatible with a small benign meningioma. Minor associated mass effect on the adjacent anterior frontal lobes with no cerebral edema. As seen by CT, this is densely mineralized.  Cerebral volume is within normal limits for age. No restricted diffusion to suggest acute infarction. No midline shift, mass effect, evidence of mass lesion, ventriculomegaly, extra-axial collection or acute intracranial hemorrhage. Cervicomedullary junction and pituitary are within normal limits. Major intracranial vascular flow voids are preserved.  Patchy cerebral white matter T2 and FLAIR hyperintensity, moderate for age but nonspecific. Mild involvement in the pons. No cortical encephalomalacia. Mild involvement in both alum I a. Cerebellum within normal limits.  Visible internal auditory structures appear normal. Visualized scalp soft tissues are within normal limits. Postoperative changes to the globes. Trace left mastoid fluid, significance doubtful. Other Visualized paranasal sinuses and mastoids are clear.  Diffusely abnormal bone marrow signal, mildly progressed since March, with a more abnormal appearance of the clivus today.  MRA HEAD FINDINGS  Antegrade flow in the posterior circulation. Dominant distal right vertebral artery. It non dominant and irregular distal left vertebral artery, but with no focal stenosis. Patent vertebrobasilar junction. Normal right PICA origin. Patent AICA origins (the left is duplicated). The basilar artery is irregular in keeping with atherosclerosis but no significant stenosis occurs. SCA and PCA origins are within normal limits. Posterior communicating arteries are diminutive or absent. Bilateral PCA branches are within normal limits.  Antegrade flow in both ICA siphons. Extensive irregularity of the  cavernous and supra clinoid segments in keeping with atherosclerosis. Mild to moderate tandem stenoses  on the left. Mild stenoses on the right. Ophthalmic artery origins are within normal limits.  Carotid termini are patent. MCA and ACA origins are patent. There is mild left MCA origin stenosis.  Mild ectasia of the anterior communicating artery without discrete aneurysm. Other visualized bilateral ACA branches are within normal limits. Visualized bilateral MCA branches are within normal limits. Incidental simple applied branching pattern on the right.  IMPRESSION: 1. No acute intracranial abnormality. Moderate for age nonspecific cerebral white matter signal changes, most commonly due to chronic small vessel disease. 2. Diffusely abnormal bone marrow signal in keeping with the "Hematopoietic neoplasm" result from marrow biopsy in March. 3. Small anterior midline meningioma, likely clinically silent. 4. Fairly extensive intracranial large vessel atherosclerosis, but no high-grade intracranial stenosis. No major circle of Willis branch occlusion.   Electronically Signed   By: Lars Pinks M.D.   On: 05/24/2014 10:13    Micro Results      No results found for this or any previous visit (from the past 240 hour(s)).   History of present illness and  Hospital Course:     Kindly see H&P for history of present illness and admission details, please review complete Labs, Consult reports and Test reports for all details in brief [name], is a 73 y.o. female, patient with history of CAD, PAF, HTN, DM2, hyperlipidemia, and CKD Stage II Dz who was brought to the ED with symptoms of Left sided weakness, and dysarthria/ garbled speech which was noticed by her husband at 1130 pm. She had been last seen normal at 10 pm. She had been having problems with hypoglycemia for the past 3-4 days and she reports that sh stopped taking her Metformin . When EMS arrived her blood sugar was found to be 66. EMS administered IV D50 x 1 with improvement in her blood sugars and her symptoms. She does not remember what had happened. She  was seen in the ED as a Code Stroke and seen by Neurology Dr Nicole Kindred and referred for a TIA workup.      1. Hypoglycemia versus TIA- TIA Workup negative including CT head, MRI/MRA of Brain, Carotid US, and 2-D ECHO, seen and cleared by Neuro for home discharge, discussed with Dr. Leonie Man, acceptable A1c and fasting lipid panel. Likely prediabetic per A1c. Completely resolved. Continue home dose aspirin    2. DM type II with Hypoglycemia- Hold Glucophage, given testing supplies to do q. a.c. at bedtime Accu-Cheks and follow with PCP, A1c was 6.4 and prediabetic range.     3. CAD- stable on ASA Rx. And Metoprolol.     4. HTN- Continue Metoprolol, and Amlodipine .   5. PAF/A.Flutter- On ASA Rx. And Metoprolol Rx. Will defer PCP for long-term and aggravation outpatient, neurology did not recommend anticoagulation as symptoms could have been secondary to hypoglycemia Plus patient has thrombocytopenia as well.    6. Thrombocytopenia- Due to Cirrhosis Hx, and consequence of Cancer and Cancer rx.           7. ARF on CKD Stage III- now baseline creatinine appears to be close to 2, improved with IV fluids continue to monitor.     8. Hyperlipidemia- On Zocor Rx.    9. 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     10. Calcified 1.6 cm meningioma - Outpatient neurosurgery followup.      Today  Subjective:   Shaelin Lalley today has no headache,no chest abdominal pain,no new weakness tingling or numbness, feels much better wants to go home today.   Objective:   Blood pressure 135/75, pulse 69, temperature 97.9 F (36.6 C), temperature source Oral, resp. rate 20, height _0  (1.575 m), weight 69.4 kg (153 lb), SpO2 97.00%.  No intake or output data in the 24 hours ending 05/25/14 1056  Exam Awake Alert, Oriented x 3, No new F.N deficits, Normal affect [place].AT,PERRAL Supple Neck,No JVD, No cervical lymphadenopathy appriciated.  Symmetrical Chest wall  movement, Good air movement bilaterally, CTAB RRR,No Gallops,Rubs or new Murmurs, No Parasternal Heave +ve B.Sounds, Abd Soft, Non tender, No organomegaly appriciated, No rebound -guarding or rigidity. No Cyanosis, Clubbing or edema, No new Rash or bruise  Data Review    Lab Results  Component Value Date   CHOL 153 05/25/2014   HDL 27* 05/25/2014   LDLCALC 86 05/25/2014   TRIG 198* 05/25/2014   CHOLHDL 5.7 05/25/2014    Lab Results  Component Value Date   HGBA1C 6.4* 05/24/2014    CBC w Diff:  Lab Results  Component Value Date   WBC 6.1 05/24/2014   WBC 7.4 05/19/2014   HGB 8.5* 05/24/2014   HGB 9.9 Repeated and Verified* 05/19/2014   HCT 25.3* 05/24/2014   HCT 29.8* 05/19/2014   PLT 135* 05/24/2014   PLT 156 05/19/2014   LYMPHOPCT 6* 05/24/2014   LYMPHOPCT 8.7* 05/19/2014   MONOPCT 7 05/24/2014   MONOPCT 12.3 05/19/2014   EOSPCT 0 05/24/2014   EOSPCT 0.1 05/19/2014   BASOPCT 0 05/24/2014   BASOPCT 0.1 05/19/2014    CMP:  Lab Results  Component Value Date   NA 142 05/25/2014   NA 139 05/19/2014   K 4.8 05/25/2014   K 3.3* 05/19/2014   CL 102 05/25/2014   CO2 24 05/25/2014   CO2 26 05/19/2014   BUN 26* 05/25/2014   BUN 22.4 05/19/2014   CREATININE 1.83* 05/25/2014   CREATININE 2.4* 05/19/2014   PROT 5.8* 05/25/2014   PROT 6.2* 05/19/2014   ALBUMIN 2.1* 05/25/2014   ALBUMIN 2.4* 05/19/2014   BILITOT 0.4 05/25/2014   BILITOT 0.62 05/19/2014   ALKPHOS 209* 05/25/2014   ALKPHOS 169* 05/19/2014   AST 20 05/25/2014   AST 11 05/19/2014   ALT 10 05/25/2014   ALT <6 05/19/2014  .   Total Time in preparing paper work, data evaluation and todays exam - 35 minutes  Thurnell Lose M.D on 05/25/2014 at 10:56 AM  [facility] Office  [phone]   **Disclaimer: This note may have been dictated with voice recognition software. Similar sounding words can inadvertently be transcribed and this note may contain transcription errors which may not have been corrected upon publication of  note.**

## 2014-05-25 NOTE — Progress Notes (Signed)
UR Completed Brenda Graves-Bigelow, RN,BSN [phone]  

## 2014-05-30 ENCOUNTER — Other Ambulatory Visit: Payer: Self-pay | Admitting: Oncology

## 2014-06-01 ENCOUNTER — Telehealth: Payer: Self-pay | Admitting: Oncology

## 2014-06-01 ENCOUNTER — Telehealth: Payer: Self-pay | Admitting: *Deleted

## 2014-06-01 DIAGNOSIS — C9200 Acute myeloblastic leukemia, not having achieved remission: Secondary | ICD-10-CM

## 2014-06-01 NOTE — Telephone Encounter (Signed)
per 6/25 pof called jill @ [facility] [phone]) re appts for 6/30 and 7/2. lmonvm for jill who was identified on vm. also called pt's home and s/w husband who was not comfortable w/taking the appts. schedule mailed.

## 2014-06-01 NOTE — Telephone Encounter (Signed)
Message from [place], [place] coordinator at [facility] [phone]) requesting Neulasta appt 6/30 and labs to be drawn on 06/08/14. Order sent to scheduler for appt.

## 2014-06-05 ENCOUNTER — Other Ambulatory Visit: Payer: Self-pay | Admitting: *Deleted

## 2014-06-05 ENCOUNTER — Telehealth: Payer: Self-pay | Admitting: Oncology

## 2014-06-05 NOTE — Telephone Encounter (Signed)
S/w the pt and she is aware of her lab appts and to pick up an appt calendar

## 2014-06-06 ENCOUNTER — Ambulatory Visit (HOSPITAL_BASED_OUTPATIENT_CLINIC_OR_DEPARTMENT_OTHER): Payer: Commercial Managed Care - HMO

## 2014-06-06 VITALS — BP 154/75 | HR 98 | Temp 100.3°F

## 2014-06-06 DIAGNOSIS — C9500 Acute leukemia of unspecified cell type not having achieved remission: Secondary | ICD-10-CM

## 2014-06-06 DIAGNOSIS — Z5189 Encounter for other specified aftercare: Secondary | ICD-10-CM | POA: Diagnosis not present

## 2014-06-06 MED ORDER — PEGFILGRASTIM INJECTION 6 MG/0.6ML
6.0000 mg | Freq: Once | SUBCUTANEOUS | Status: AC
  Administered 2014-06-06: 6 mg via SUBCUTANEOUS
  Filled 2014-06-06: qty 0.6

## 2014-06-08 ENCOUNTER — Ambulatory Visit: Payer: PRIVATE HEALTH INSURANCE | Admitting: Oncology

## 2014-06-08 ENCOUNTER — Ambulatory Visit: Payer: PRIVATE HEALTH INSURANCE

## 2014-06-08 ENCOUNTER — Ambulatory Visit (HOSPITAL_BASED_OUTPATIENT_CLINIC_OR_DEPARTMENT_OTHER): Payer: Commercial Managed Care - HMO

## 2014-06-08 ENCOUNTER — Inpatient Hospital Stay (HOSPITAL_COMMUNITY)
Admission: AD | Admit: 2014-06-08 | Discharge: 2014-06-16 | DRG: 809 | Disposition: A | Discharge status: Home / Self Care | Payer: Medicare HMO | Source: Ambulatory Visit | Attending: Oncology | Admitting: Oncology

## 2014-06-08 ENCOUNTER — Other Ambulatory Visit: Payer: Self-pay | Admitting: Oncology

## 2014-06-08 ENCOUNTER — Encounter (HOSPITAL_COMMUNITY): Payer: Self-pay | Admitting: *Deleted

## 2014-06-08 ENCOUNTER — Other Ambulatory Visit (HOSPITAL_BASED_OUTPATIENT_CLINIC_OR_DEPARTMENT_OTHER): Payer: Commercial Managed Care - HMO

## 2014-06-08 ENCOUNTER — Encounter: Payer: Self-pay | Admitting: *Deleted

## 2014-06-08 ENCOUNTER — Ambulatory Visit (HOSPITAL_COMMUNITY)
Admission: RE | Admit: 2014-06-08 | Discharge: 2014-06-08 | Disposition: A | Discharge status: Home / Self Care | Payer: Medicare HMO | Source: Ambulatory Visit | Attending: Oncology | Admitting: Oncology

## 2014-06-08 ENCOUNTER — Other Ambulatory Visit: Payer: Self-pay | Admitting: *Deleted

## 2014-06-08 VITALS — Ht 62.0 in | Wt 153.0 lb

## 2014-06-08 VITALS — BP 134/76 | HR 77 | Temp 97.2°F

## 2014-06-08 DIAGNOSIS — E8809 Other disorders of plasma-protein metabolism, not elsewhere classified: Secondary | ICD-10-CM | POA: Diagnosis present

## 2014-06-08 DIAGNOSIS — C9200 Acute myeloblastic leukemia, not having achieved remission: Secondary | ICD-10-CM | POA: Diagnosis present

## 2014-06-08 DIAGNOSIS — D709 Neutropenia, unspecified: Secondary | ICD-10-CM | POA: Diagnosis present

## 2014-06-08 DIAGNOSIS — Z808 Family history of malignant neoplasm of other organs or systems: Secondary | ICD-10-CM

## 2014-06-08 DIAGNOSIS — Z8049 Family history of malignant neoplasm of other genital organs: Secondary | ICD-10-CM | POA: Diagnosis not present

## 2014-06-08 DIAGNOSIS — Z8701 Personal history of pneumonia (recurrent): Secondary | ICD-10-CM

## 2014-06-08 DIAGNOSIS — Z87891 Personal history of nicotine dependence: Secondary | ICD-10-CM

## 2014-06-08 DIAGNOSIS — T451X5A Adverse effect of antineoplastic and immunosuppressive drugs, initial encounter: Secondary | ICD-10-CM | POA: Diagnosis present

## 2014-06-08 DIAGNOSIS — I2510 Atherosclerotic heart disease of native coronary artery without angina pectoris: Secondary | ICD-10-CM | POA: Diagnosis present

## 2014-06-08 DIAGNOSIS — Z836 Family history of other diseases of the respiratory system: Secondary | ICD-10-CM

## 2014-06-08 DIAGNOSIS — Z9849 Cataract extraction status, unspecified eye: Secondary | ICD-10-CM

## 2014-06-08 DIAGNOSIS — N19 Unspecified kidney failure: Secondary | ICD-10-CM

## 2014-06-08 DIAGNOSIS — A0472 Enterocolitis due to Clostridium difficile, not specified as recurrent: Secondary | ICD-10-CM

## 2014-06-08 DIAGNOSIS — E119 Type 2 diabetes mellitus without complications: Secondary | ICD-10-CM | POA: Diagnosis present

## 2014-06-08 DIAGNOSIS — E785 Hyperlipidemia, unspecified: Secondary | ICD-10-CM | POA: Diagnosis present

## 2014-06-08 DIAGNOSIS — E876 Hypokalemia: Secondary | ICD-10-CM | POA: Diagnosis not present

## 2014-06-08 DIAGNOSIS — D61810 Antineoplastic chemotherapy induced pancytopenia: Secondary | ICD-10-CM | POA: Diagnosis present

## 2014-06-08 DIAGNOSIS — C9201 Acute myeloblastic leukemia, in remission: Secondary | ICD-10-CM

## 2014-06-08 DIAGNOSIS — R609 Edema, unspecified: Secondary | ICD-10-CM | POA: Diagnosis present

## 2014-06-08 DIAGNOSIS — N182 Chronic kidney disease, stage 2 (mild): Secondary | ICD-10-CM | POA: Diagnosis present

## 2014-06-08 DIAGNOSIS — I4891 Unspecified atrial fibrillation: Secondary | ICD-10-CM | POA: Diagnosis present

## 2014-06-08 DIAGNOSIS — R233 Spontaneous ecchymoses: Secondary | ICD-10-CM | POA: Diagnosis present

## 2014-06-08 DIAGNOSIS — Z803 Family history of malignant neoplasm of breast: Secondary | ICD-10-CM

## 2014-06-08 DIAGNOSIS — D61818 Other pancytopenia: Secondary | ICD-10-CM | POA: Insufficient documentation

## 2014-06-08 DIAGNOSIS — Z87442 Personal history of urinary calculi: Secondary | ICD-10-CM

## 2014-06-08 DIAGNOSIS — D696 Thrombocytopenia, unspecified: Secondary | ICD-10-CM

## 2014-06-08 DIAGNOSIS — I129 Hypertensive chronic kidney disease with stage 1 through stage 4 chronic kidney disease, or unspecified chronic kidney disease: Secondary | ICD-10-CM | POA: Diagnosis present

## 2014-06-08 DIAGNOSIS — R197 Diarrhea, unspecified: Secondary | ICD-10-CM

## 2014-06-08 DIAGNOSIS — Z860100 Personal history of colon polyps, unspecified: Secondary | ICD-10-CM

## 2014-06-08 DIAGNOSIS — C9500 Acute leukemia of unspecified cell type not having achieved remission: Secondary | ICD-10-CM | POA: Diagnosis present

## 2014-06-08 LAB — CBC WITH DIFFERENTIAL/PLATELET
BASO%: 0 % (ref 0.0–2.0)
Basophils Absolute: 0 10*3/uL (ref 0.0–0.1)
EOS ABS: 0 10*3/uL (ref 0.0–0.5)
EOS%: 5.4 % (ref 0.0–7.0)
HCT: 22.9 % — ABNORMAL LOW (ref 34.8–46.6)
HGB: 7.7 g/dL — ABNORMAL LOW (ref 11.6–15.9)
LYMPH%: 46.4 % (ref 14.0–49.7)
MCH: 28.6 pg (ref 25.1–34.0)
MCHC: 33.6 g/dL (ref 31.5–36.0)
MCV: 85.1 fL (ref 79.5–101.0)
MONO#: 0 10*3/uL — ABNORMAL LOW (ref 0.1–0.9)
MONO%: 0 % (ref 0.0–14.0)
NEUT%: 48.2 % (ref 38.4–76.8)
NEUTROS ABS: 0.3 10*3/uL — AB (ref 1.5–6.5)
Platelets: 10 10*3/uL — CL (ref 145–400)
RBC: 2.69 10*6/uL — ABNORMAL LOW (ref 3.70–5.45)
RDW: 14.8 % — AB (ref 11.2–14.5)
WBC: 0.6 10*3/uL — CL (ref 3.9–10.3)
lymph#: 0.3 10*3/uL — ABNORMAL LOW (ref 0.9–3.3)
nRBC: 0 % (ref 0–0)

## 2014-06-08 LAB — GLUCOSE, CAPILLARY: GLUCOSE-CAPILLARY: 161 mg/dL — AB (ref 70–99)

## 2014-06-08 LAB — HOLD TUBE, BLOOD BANK

## 2014-06-08 LAB — PREPARE RBC (CROSSMATCH)

## 2014-06-08 LAB — ABO/RH: ABO/RH(D): O NEG

## 2014-06-08 MED ORDER — DIPHENHYDRAMINE HCL 25 MG PO CAPS
25.0000 mg | ORAL_CAPSULE | Freq: Once | ORAL | Status: AC
  Administered 2014-06-08: 25 mg via ORAL

## 2014-06-08 MED ORDER — SODIUM CHLORIDE 0.9 % IV SOLN
INTRAVENOUS | Status: DC
  Administered 2014-06-09: 03:00:00 via INTRAVENOUS

## 2014-06-08 MED ORDER — ACYCLOVIR 200 MG PO CAPS
400.0000 mg | ORAL_CAPSULE | Freq: Every morning | ORAL | Status: DC
Start: 2014-06-09 — End: 2014-06-12
  Administered 2014-06-09 – 2014-06-11 (×3): 400 mg via ORAL
  Filled 2014-06-08 (×4): qty 2

## 2014-06-08 MED ORDER — ACETAMINOPHEN 325 MG PO TABS
ORAL_TABLET | ORAL | Status: AC
  Filled 2014-06-08: qty 2

## 2014-06-08 MED ORDER — HEPARIN SOD (PORK) LOCK FLUSH 100 UNIT/ML IV SOLN
500.0000 [IU] | Freq: Every day | INTRAVENOUS | Status: DC | PRN
  Filled 2014-06-08: qty 5

## 2014-06-08 MED ORDER — METOPROLOL TARTRATE 50 MG PO TABS
50.0000 mg | ORAL_TABLET | Freq: Two times a day (BID) | ORAL | Status: DC
  Administered 2014-06-08 – 2014-06-16 (×14): 50 mg via ORAL
  Filled 2014-06-08 (×17): qty 1

## 2014-06-08 MED ORDER — ACETAMINOPHEN 325 MG PO TABS
650.0000 mg | ORAL_TABLET | Freq: Once | ORAL | Status: AC
  Administered 2014-06-08: 650 mg via ORAL

## 2014-06-08 MED ORDER — SODIUM CHLORIDE 0.9 % IV SOLN
250.0000 mL | Freq: Once | INTRAVENOUS | Status: AC
  Administered 2014-06-08: 250 mL via INTRAVENOUS

## 2014-06-08 MED ORDER — NITROGLYCERIN 0.4 MG SL SUBL: 0.4000 mg | SUBLINGUAL_TABLET | SUBLINGUAL | Status: DC | PRN

## 2014-06-08 MED ORDER — SODIUM CHLORIDE 0.9 % IJ SOLN
10.0000 mL | INTRAMUSCULAR | Status: DC | PRN
  Filled 2014-06-08: qty 10

## 2014-06-08 MED ORDER — ACETAMINOPHEN 325 MG PO TABS: 650.0000 mg | ORAL_TABLET | Freq: Four times a day (QID) | ORAL | Status: DC | PRN

## 2014-06-08 MED ORDER — LEVOFLOXACIN 250 MG PO TABS
250.0000 mg | ORAL_TABLET | Freq: Every day | ORAL | Status: DC
  Administered 2014-06-08 – 2014-06-16 (×9): 250 mg via ORAL
  Filled 2014-06-08 (×9): qty 1

## 2014-06-08 MED ORDER — PANTOPRAZOLE SODIUM 40 MG PO TBEC
40.0000 mg | DELAYED_RELEASE_TABLET | Freq: Every day | ORAL | Status: DC
  Administered 2014-06-08: 40 mg via ORAL
  Filled 2014-06-08: qty 1

## 2014-06-08 MED ORDER — DIPHENHYDRAMINE HCL 25 MG PO CAPS
ORAL_CAPSULE | ORAL | Status: AC
  Filled 2014-06-08: qty 1

## 2014-06-08 MED ORDER — FLUCONAZOLE 100 MG PO TABS
100.0000 mg | ORAL_TABLET | Freq: Every day | ORAL | Status: DC
  Administered 2014-06-08 – 2014-06-16 (×9): 100 mg via ORAL
  Filled 2014-06-08 (×9): qty 1

## 2014-06-08 MED ORDER — AMLODIPINE BESYLATE 5 MG PO TABS
5.0000 mg | ORAL_TABLET | Freq: Every day | ORAL | Status: DC
  Administered 2014-06-08 – 2014-06-16 (×8): 5 mg via ORAL
  Filled 2014-06-08 (×10): qty 1

## 2014-06-08 MED ORDER — LORAZEPAM 0.5 MG PO TABS: 0.5000 mg | ORAL_TABLET | Freq: Four times a day (QID) | ORAL | Status: DC | PRN

## 2014-06-08 NOTE — H&P (Signed)
Patient History and Physical   [name] [id number] 1941-05-19 73 y.o. 06/08/2014    Patient Identification: 73 year old with AML, status post high-dose cytarabine consolidation  HPI:  [name] was diagnosed with AML (monocytic differentiation) in March of 2015. She was treated with induction cytarabine/daunorubicin (7+3) followed by reinduction with cytarabine/daunorubicin and zinecard (5+2) on 15 2015. A recovering bone marrow 05/16/2014 was consistent with remission.  She was admitted for high-dose cytarabine consolidation 05/30/2014. She was treated with cytarabine at a dose of 400 mg per meter squared every 24 hours for 5 days. She was discharged 06/05/2014. She received Neulasta 06/06/2014.  [name] presented today for a nadir CBC. She was found to have severe pancytopenia. She is admitted for transfusion support.  PMH:  Past Medical History  Diagnosis Date  . DIABETES MELLITUS, TYPE II 07/13/2007  . HYPERLIPIDEMIA 12/24/2007  . HYPERTENSION 07/13/2007  . CORONARY ARTERY DISEASE 12/24/2007  . NEPHROLITHIASIS, HX OF 12/24/2007  . Colon polyps 04/29/2010    TUBULAR ADENOMA AND A SERRATED ADENOMA  . Acute pancreatitis   .  history of pneumonia treated with cefepime and micafungin    . Chronic kidney disease, stage II (mild)     .   History of VRE bacteremia treated with linezolid   .  Atrial fibrillation  Past Surgical History  Procedure Laterality Date  . Excisional hemorrhoidectomy  unknown  . Abdominal hysterectomy  unknown  . Nasal sinus surgery  unknown  . Cardiac catheterization  2002  . Cataract extraction Bilateral     Allergies:  No Known Allergies  Medications: see electronically medical  Social History:   She lives with her husband. She makes eyeglasses. She has a remote history of smoking. No alcohol use.  Family History:  Family History  Problem Relation Age of Onset  . Cancer Father     throat ca  . COPD Father   . Cancer Sister    uterine ca and breast ca  . Cancer Brother     throat    Review of Systems:  Positives include: Diarrhea for the past 1 week, malaise  A complete ROS was otherwise negative.   Physical Exam:  HEENT: Edentulous, oral cavity without bleeding or thrush Lungs: Clear bilaterally Cardiac: Irregular Abdomen: Mild diffuse tenderness, no hepatosplenomegaly, soft  Vascular: Trace pitting edema at the right greater than left lower leg Neurologic: Alert and oriented, the motor exam appears grossly intact. Skin: Small ecchymoses over the arms, no rash  Lab Results:  Lab Results  Component Value Date   WBC 0.6* 06/08/2014   HGB 7.7* 06/08/2014   HCT 22.9* 06/08/2014   MCV 85.1 06/08/2014   PLT 10* 06/08/2014   NEUTROABS 0.3* 06/08/2014      Impression and Plan:  1. Acute myelogenous leukemia, status post cycle 1 high-dose cytarabine consolidation therapy, currently day 10 2. Severe pancytopenia secondary chemotherapy 3. Diabetes-currently maintained off of metformin secondary to recent hypoglycemia 4. atrial fibrillation 5. history of coronary artery disease 6. chronic renal failure 7. Hypertension 8. Hyperlipidemia 9. diarrhea   [name] has a history of acute myelogenous leukemia, currently in clinical remission. She is now at day 10 following a first cycle of high-dose cytarabine consolidation therapy. She has a severe pancytopenia. She will be admitted for transfusion support with platelets and packed red blood cells. We will check a stool sample for the C. difficile toxin. The diarrhea is most likely related to chemotherapy.  She will be discharged to home if  she is in stable condition on 06/09/2014.  Betsy Coder, MD 06/08/2014, 5:12 PM

## 2014-06-08 NOTE — Progress Notes (Addendum)
Platelet transfusion stopped. 282 ml transfused. Pt tolerated transfusion with no suspected reaction

## 2014-06-08 NOTE — Progress Notes (Signed)
Fax from [facility] requesting stool collection today for C-diff when she comes in today for other labs. Added order as requested and noted on order to call results to Dr. Florene Glen. Call [place] if not able to reach [facility] since our office is closed tomorrow.

## 2014-06-08 NOTE — Progress Notes (Signed)
Dr. [name] was on unit to check on pt. Dr. [name] stated that he wanted pt to receive the unit of platelets before the 2 units of blood. Order was released by [facility] nurse for pt to receive 1 unit of platelets which were not given at the [facility]. Confirmed with blood bank that platelets had not been given yet. When this RN went to scan the platelets it was stated to be discontinued because the patient was transferred here before they were given. Platelets given per protocol and documented in progress notes. Noreene Larsson RN, BSN

## 2014-06-08 NOTE — Progress Notes (Signed)
1725-notified by blood bank that platelets are ready. Made nurse on 3-west aware.

## 2014-06-08 NOTE — Progress Notes (Signed)
Dr. [name] at chairside assessing patient at this time.

## 2014-06-08 NOTE — Progress Notes (Signed)
Platelets administration: Unit [id number] started 2038 Rate 50 Volume 12.5 platelet type A positive. Linked to Right Port-A-Cath  Verified with Adline Peals RN

## 2014-06-08 NOTE — Progress Notes (Signed)
Platelets rate increased to 531ml/hr.

## 2014-06-08 NOTE — Patient Instructions (Signed)
Platelet Transfusion Information  This is information about transfusions of platelets. Platelets are tiny cells made by the bone marrow and found in the blood. When a blood vessel is damaged platelets rush to the damaged area to help form a clot. This begins the healing process. When platelets get very low your blood may have trouble clotting. This may be from:   Illness.   Blood disorder.   Chemotherapy to treat cancer.  Often lower platelet counts do not usually cause problems.   Platelets usually last for 7 to 10 days. If they are not used not used in an injury, they are broken down by the liver or spleen.  Symptoms of low platelet count include:   Nosebleeds.   Bleeding gums.   Heavy periods.   Bruising and tiny blood spots in the skin.   Pin point spots of bleeding are called (petechiae).   Larger bruises (purpura).   Bleeding can be more serious if it happens in the brain or bowel.  Platelet transfusions are often used to keep the platelet count at an acceptable level. Serious bleeding due to low platelets is uncommon.  RISKS AND COMPLICATIONS  Severe side effects from platelet transfusions are uncommon. Minor reactions may include:   Itching.   Rashes.   High temperature and shivering.  Medications are available to stop transfusion reactions. Let your caregivers know if you develop any of the above problems.   If you are having platelet transfusions frequently they may get less effective. This is called becoming refractory to platelets. It is uncommon. This can happen from non-immune causes and immune causes. Non-immune causes include:   High temperatures.   Some medications.   An enlarged spleen.  Immune causes happen when your body discovers the platelets are not your own and begin making antibodies against them. The antibodies kill the platelets quickly. Even with platelet transfusions you may still notice problems with bleeding or bruising. Let your caregivers know about this. Other things  can be done to help if this happens.   BEFORE THE PROCEDURE    Your doctors will check your platelet count regularly.   If the platelet count is too low it may be necessary to have a platelet transfusion.   This is more important before certain procedures with a risk of bleeding such as a spinal tap.   Platelet transfusion reduces the risk of bleeding during or after the procedure.   Except in emergencies, giving a transfusion requires a written consent.  Before blood is taken from a donor, a complete history is taken to make sure the person has no history of previous diseases, nor engages in risky social behavior. Examples of this are intravenous drug use or sexual activity with multiple partners. This could lead to infected blood or blood products being used. This history is done even in spite of the extensive testing to make sure the blood is safe. All blood products transfused are tested to make sure it is a match for the person getting the blood. It is also checked for infections. Blood is the safest it has ever been. The risk of getting an infection is very low.  PROCEDURE   The platelets are stored in small plastic bags which are kept at a low temperature.   Each bag is called a unit and sometimes two units are given. They are given through an intravenous line by drip infusion over about one half hour.   Usually blood is collected from multiple people to   get enough to transfuse.   Sometimes, the platelets are collected from a single person. This is done using a special machine that separates the platelets from the blood. The machine is called an apheresis machine. Platelets collected in this way are called apheresed platelets. Apheresed platelets reduce the risk of becoming sensitive to the platelets. This lowers the chances of having a transfusion reaction.   As it only takes a short time to give the platelets, this treatment can be given in an outpatients department. Platelets can also be given  before or after other treatments.  SEEK IMMEDIATE MEDICAL CARE IF:  Any of the following symptoms over the next 12 hours or several days:   Shaking chills.   Fever with a temperature greater than 102 F (38.9 C) develops.   Back pain or muscle pain.   People around you feel you are not acting correctly, or you are confused.   Blood in the urine or bowel movements or bleeding from any place in your body.   Shortness of breath, or difficulty breathing.   Dizziness.   Fainting.   You break out in a rash or develop hives.   You have a decrease in the amount of urine you are putting out, or the urine turns a dark color or changes to pink, red, or [place].   A severe headache or stiff neck.   Bruising more easily.  Document Released: 09/21/2007 Document Revised: 02/16/2012 Document Reviewed: 09/21/2007  ExitCare Patient Information 2015 [employer]. This information is not intended to replace advice given to you by your health care provider. Make sure you discuss any questions you have with your health care provider.

## 2014-06-08 NOTE — Progress Notes (Signed)
See admission history and physical 06/08/2014

## 2014-06-09 DIAGNOSIS — D61818 Other pancytopenia: Secondary | ICD-10-CM | POA: Diagnosis not present

## 2014-06-09 DIAGNOSIS — T451X5A Adverse effect of antineoplastic and immunosuppressive drugs, initial encounter: Secondary | ICD-10-CM | POA: Diagnosis not present

## 2014-06-09 LAB — CBC
HEMATOCRIT: 25.8 % — AB (ref 36.0–46.0)
HEMOGLOBIN: 9 g/dL — AB (ref 12.0–15.0)
MCH: 29.6 pg (ref 26.0–34.0)
MCHC: 34.9 g/dL (ref 30.0–36.0)
MCV: 84.9 fL (ref 78.0–100.0)
Platelets: 5 10*3/uL — CL (ref 150–400)
RBC: 3.04 MIL/uL — ABNORMAL LOW (ref 3.87–5.11)
RDW: 14 % (ref 11.5–15.5)
WBC: 0.2 10*3/uL — CL (ref 4.0–10.5)

## 2014-06-09 LAB — PREPARE PLATELET PHERESIS: UNIT DIVISION: 0

## 2014-06-09 LAB — GLUCOSE, CAPILLARY: GLUCOSE-CAPILLARY: 89 mg/dL (ref 70–99)

## 2014-06-09 LAB — COMPREHENSIVE METABOLIC PANEL
ALBUMIN: 2.2 g/dL — AB (ref 3.5–5.2)
ALT: 11 U/L (ref 0–35)
ANION GAP: 14 (ref 5–15)
AST: 10 U/L (ref 0–37)
Alkaline Phosphatase: 171 U/L — ABNORMAL HIGH (ref 39–117)
BUN: 37 mg/dL — AB (ref 6–23)
CO2: 25 mEq/L (ref 19–32)
CREATININE: 1.57 mg/dL — AB (ref 0.50–1.10)
Calcium: 7.1 mg/dL — ABNORMAL LOW (ref 8.4–10.5)
Chloride: 104 mEq/L (ref 96–112)
GFR calc non Af Amer: 32 mL/min — ABNORMAL LOW (ref >90)
GFR, EST AFRICAN AMERICAN: 37 mL/min — AB (ref >90)
Glucose, Bld: 143 mg/dL — ABNORMAL HIGH (ref 70–99)
Potassium: 3.3 mEq/L — ABNORMAL LOW (ref 3.7–5.3)
Sodium: 143 mEq/L (ref 137–147)
TOTAL PROTEIN: 5.7 g/dL — AB (ref 6.0–8.3)
Total Bilirubin: 1.4 mg/dL — ABNORMAL HIGH (ref 0.3–1.2)

## 2014-06-09 LAB — CLOSTRIDIUM DIFFICILE BY PCR: CDIFFPCR: POSITIVE — AB

## 2014-06-09 LAB — PLATELET COUNT

## 2014-06-09 MED ORDER — LORAZEPAM 0.5 MG PO TABS
0.5000 mg | ORAL_TABLET | Freq: Four times a day (QID) | ORAL | Status: DC | PRN
Start: 2014-06-09 — End: 2014-06-16
  Administered 2014-06-09: 0.5 mg via ORAL
  Filled 2014-06-09: qty 1

## 2014-06-09 MED ORDER — PROMETHAZINE HCL 25 MG PO TABS
12.5000 mg | ORAL_TABLET | Freq: Four times a day (QID) | ORAL | Status: DC | PRN
  Administered 2014-06-09 – 2014-06-15 (×5): 12.5 mg via ORAL
  Filled 2014-06-09 (×6): qty 1

## 2014-06-09 MED ORDER — METRONIDAZOLE 500 MG PO TABS
500.0000 mg | ORAL_TABLET | Freq: Three times a day (TID) | ORAL | Status: DC
  Administered 2014-06-09 – 2014-06-16 (×22): 500 mg via ORAL
  Filled 2014-06-09 (×24): qty 1

## 2014-06-09 MED ORDER — PANTOPRAZOLE SODIUM 40 MG PO TBEC
40.0000 mg | DELAYED_RELEASE_TABLET | Freq: Every day | ORAL | Status: DC
  Administered 2014-06-09 – 2014-06-16 (×8): 40 mg via ORAL
  Filled 2014-06-09 (×8): qty 1

## 2014-06-09 MED ORDER — OXYCODONE-ACETAMINOPHEN 5-325 MG PO TABS
1.0000 | ORAL_TABLET | ORAL | Status: DC | PRN
  Administered 2014-06-09 – 2014-06-16 (×24): 1 via ORAL
  Filled 2014-06-09 (×24): qty 1

## 2014-06-09 MED ORDER — SODIUM CHLORIDE 0.9 % IV SOLN: INTRAVENOUS | Status: DC

## 2014-06-09 MED ORDER — NITROGLYCERIN 0.4 MG SL SUBL: 0.4000 mg | SUBLINGUAL_TABLET | SUBLINGUAL | Status: DC | PRN

## 2014-06-09 MED ORDER — ACETAMINOPHEN 325 MG PO TABS: 650.0000 mg | ORAL_TABLET | Freq: Four times a day (QID) | ORAL | Status: DC | PRN

## 2014-06-09 MED ORDER — POTASSIUM CHLORIDE IN NACL 20-0.9 MEQ/L-% IV SOLN
INTRAVENOUS | Status: DC
  Administered 2014-06-09: 1000 mL via INTRAVENOUS
  Administered 2014-06-09 – 2014-06-12 (×4): via INTRAVENOUS
  Filled 2014-06-09 (×6): qty 1000

## 2014-06-09 NOTE — Progress Notes (Signed)
CRITICAL VALUE ALERT  Critical value received:   Positive c-diff  Date of notification:  06/09/13  Time of notification:  0840  Critical value read back:Yes.    Nurse who received alert:  Sandie Ano  MD notified (1st page):  Dr. Learta Codding  Time of first page:  [phone]  MD notified (2nd page):  Time of second page:  Responding MD: DR. Learta Codding  Time MD responded:  [phone]

## 2014-06-09 NOTE — Progress Notes (Signed)
IP PROGRESS NOTE  Subjective:   She feels stronger after the red cell transfusion. No bleeding. She continues to have diarrhea. Mild abdominal "soreness ". No fever.  Objective: Vital signs in last 24 hours: Blood pressure 133/49, pulse 82, temperature 98.5 F (36.9 C), temperature source Oral, resp. rate 20, height 5\' 2"  (1.575 m), weight 153 lb (69.4 kg), SpO2 97.00%.  Intake/Output from previous day: 07/02 0701 - 07/03 0700 In: 909.2 [I.V.:379.2; Blood:530] Out: -   Physical Exam:  HEENT: No thrush or bleeding Lungs: Clear bilaterally Cardiac: Irregular Abdomen: Soft, mild diffuse tenderness Extremities: Trace pitting edema at the lower leg bilaterally  Portacath/PICC-without erythema  Lab Results:  Recent Labs  06/08/14 1456 06/09/14 0500  WBC 0.6* 0.2*  HGB 7.7* 9.0*  HCT 22.9* 25.8*  PLT 10* 5*    BMET  Recent Labs  06/09/14 0500  NA 143  K 3.3*  CL 104  CO2 25  GLUCOSE 143*  BUN 37*  CREATININE 1.57*  CALCIUM 7.1*    Studies/Results: No results found.  Medications: I have reviewed the patient's current medications.  Assessment/Plan:  1. Acute myelogenous leukemia, status post cycle 1 high-dose cytarabine consolidation therapy, currently day 11 2. Severe pancytopenia secondary chemotherapy  3. Diabetes-currently maintained off of metformin secondary to recent hypoglycemia  4. atrial fibrillation  5. history of coronary artery disease  6. chronic renal failure  7. Hypertension  8. Hyperlipidemia  9. diarrhea-stool positive for the C. difficile toxin 10. Hypokalemia  She has persistent severe pancytopenia. The platelets remain low despite the transfusion yesterday. We will transfuse platelets again today.  She has been diagnosed with C. difficile colitis. We will begin Flagyl therapy.  I will add intravenous hydration with  potassium.    LOS: 1 day   [name]  06/09/2014, 9:37 AM

## 2014-06-09 NOTE — Progress Notes (Addendum)
WBC count this AM 0.2, 0.6 yesterday. MD to arrive to unit in 1 hour. Protective precautions implemented.  0619 Critical value Platelets 5. Was 59 yesterday. Waiting on MD arrival to unit. Noreene Larsson RN, BSN

## 2014-06-10 LAB — CBC WITH DIFFERENTIAL/PLATELET
Basophils Absolute: 0 10*3/uL (ref 0.0–0.1)
Basophils Relative: 0 % (ref 0–1)
EOS PCT: 0 % (ref 0–5)
Eosinophils Absolute: 0 10*3/uL (ref 0.0–0.7)
HCT: 20.3 % — ABNORMAL LOW (ref 36.0–46.0)
HEMOGLOBIN: 7.1 g/dL — AB (ref 12.0–15.0)
LYMPHS PCT: 96 % — AB (ref 12–46)
Lymphs Abs: 0.3 10*3/uL — ABNORMAL LOW (ref 0.7–4.0)
MCH: 30.1 pg (ref 26.0–34.0)
MCHC: 35 g/dL (ref 30.0–36.0)
MCV: 86 fL (ref 78.0–100.0)
MONOS PCT: 4 % (ref 3–12)
Monocytes Absolute: 0 10*3/uL — ABNORMAL LOW (ref 0.1–1.0)
NEUTROS PCT: 0 % — AB (ref 43–77)
Neutro Abs: 0 10*3/uL — ABNORMAL LOW (ref 1.7–7.7)
Platelets: 5 10*3/uL — CL (ref 150–400)
RBC: 2.36 MIL/uL — AB (ref 3.87–5.11)
RDW: 14.2 % (ref 11.5–15.5)
WBC: 0.3 10*3/uL — CL (ref 4.0–10.5)

## 2014-06-10 LAB — COMPREHENSIVE METABOLIC PANEL
ALT: 9 U/L (ref 0–35)
ANION GAP: 13 (ref 5–15)
AST: 9 U/L (ref 0–37)
Albumin: 2.2 g/dL — ABNORMAL LOW (ref 3.5–5.2)
Alkaline Phosphatase: 188 U/L — ABNORMAL HIGH (ref 39–117)
BUN: 36 mg/dL — ABNORMAL HIGH (ref 6–23)
CALCIUM: 7.1 mg/dL — AB (ref 8.4–10.5)
CHLORIDE: 106 meq/L (ref 96–112)
CO2: 23 mEq/L (ref 19–32)
Creatinine, Ser: 1.46 mg/dL — ABNORMAL HIGH (ref 0.50–1.10)
GFR calc Af Amer: 40 mL/min — ABNORMAL LOW (ref >90)
GFR calc non Af Amer: 35 mL/min — ABNORMAL LOW (ref >90)
GLUCOSE: 106 mg/dL — AB (ref 70–99)
POTASSIUM: 3.7 meq/L (ref 3.7–5.3)
SODIUM: 142 meq/L (ref 137–147)
Total Bilirubin: 1.2 mg/dL (ref 0.3–1.2)
Total Protein: 5.7 g/dL — ABNORMAL LOW (ref 6.0–8.3)

## 2014-06-10 LAB — PREPARE PLATELET PHERESIS: Unit division: 0

## 2014-06-10 LAB — MAGNESIUM: MAGNESIUM: 1 mg/dL — AB (ref 1.5–2.5)

## 2014-06-10 LAB — PLATELET COUNT: PLATELETS: 11 10*3/uL — AB (ref 150–400)

## 2014-06-10 MED ORDER — MAGNESIUM OXIDE 400 (241.3 MG) MG PO TABS
800.0000 mg | ORAL_TABLET | Freq: Two times a day (BID) | ORAL | Status: DC
  Administered 2014-06-10 (×2): 800 mg via ORAL
  Filled 2014-06-10 (×4): qty 2

## 2014-06-10 MED ORDER — AMINOCAPROIC ACID 500 MG PO TABS
1.0000 g | ORAL_TABLET | Freq: Four times a day (QID) | ORAL | Status: DC
  Administered 2014-06-10 – 2014-06-16 (×24): 1 g via ORAL
  Filled 2014-06-10 (×30): qty 2

## 2014-06-10 NOTE — Progress Notes (Signed)
IP PROGRESS NOTE  Subjective:   The diarrhea has improved. The stool is dark. No gross bleeding..  Objective: Vital signs in last 24 hours: Blood pressure 136/71, pulse 90, temperature 98.3 F (36.8 C), temperature source Oral, resp. rate 16, height 5\' 2"  (1.575 m), weight 153 lb (69.4 kg), SpO2 100.00%.  Intake/Output from previous day: 07/03 0701 - 07/04 0700 In: 363.8 [P.O.:120; I.V.:231.3; Blood:12.5] Out: -   Physical Exam:  HEENT: No thrush or bleeding Lungs: Clear bilaterally Cardiac: Irregular Abdomen: Soft, mild diffuse tenderness Extremities: Trace pitting edema at the right greater than left lower leg Skin: Small ecchymoses over the arms  Portacath/PICC-without erythema  Lab Results:  Recent Labs  06/09/14 0500 06/09/14 1040 06/10/14 0525  WBC 0.2*  --  0.3*  HGB 9.0*  --  7.1*  HCT 25.8*  --  20.3*  PLT 5* <5* 5*    BMET  Recent Labs  06/09/14 0500 06/10/14 0525  NA 143 142  K 3.3* 3.7  CL 104 106  CO2 25 23  GLUCOSE 143* 106*  BUN 37* 36*  CREATININE 1.57* 1.46*  CALCIUM 7.1* 7.1*    Studies/Results: No results found.  Medications: I have reviewed the patient's current medications.  Assessment/Plan:  1. Acute myelogenous leukemia, status post cycle 1 high-dose cytarabine consolidation therapy, currently day 12 2. Severe pancytopenia secondary chemotherapy-she appears to be alloimmunized 2 platelets  3. Diabetes-currently maintained off of metformin secondary to recent hypoglycemia  4. atrial fibrillation  5. history of coronary artery disease  6. chronic renal failure  7. Hypertension  8. Hyperlipidemia  9. diarrhea-stool positive for the C. difficile toxin, on Flagyl 10. Hypokalemia-improved  She has persistent severe pancytopenia. The thrombocytopenia has not responded to platelet transfusions. We will try another platelet transfusion today and and Amicar.  There has been no gross GI bleeding, but the stool is dark. We will  followup on the hemoglobin 06/11/2014 and transfuse red cells as indicated.      LOS: 2 days   Ryle Buscemi  06/10/2014, 9:31 AM

## 2014-06-11 LAB — BASIC METABOLIC PANEL
Anion gap: 15 (ref 5–15)
BUN: 32 mg/dL — AB (ref 6–23)
CO2: 22 meq/L (ref 19–32)
Calcium: 7.4 mg/dL — ABNORMAL LOW (ref 8.4–10.5)
Chloride: 106 mEq/L (ref 96–112)
Creatinine, Ser: 1.46 mg/dL — ABNORMAL HIGH (ref 0.50–1.10)
GFR calc Af Amer: 40 mL/min — ABNORMAL LOW (ref >90)
GFR, EST NON AFRICAN AMERICAN: 35 mL/min — AB (ref >90)
GLUCOSE: 106 mg/dL — AB (ref 70–99)
POTASSIUM: 3.7 meq/L (ref 3.7–5.3)
Sodium: 143 mEq/L (ref 137–147)

## 2014-06-11 LAB — CBC
HEMATOCRIT: 20.8 % — AB (ref 36.0–46.0)
Hemoglobin: 7.2 g/dL — ABNORMAL LOW (ref 12.0–15.0)
MCH: 30 pg (ref 26.0–34.0)
MCHC: 34.6 g/dL (ref 30.0–36.0)
MCV: 86.7 fL (ref 78.0–100.0)
Platelets: 12 10*3/uL — CL (ref 150–400)
RBC: 2.4 MIL/uL — AB (ref 3.87–5.11)
RDW: 13.9 % (ref 11.5–15.5)
WBC: 0.4 10*3/uL — CL (ref 4.0–10.5)

## 2014-06-11 LAB — PREPARE PLATELET PHERESIS: Unit division: 0

## 2014-06-11 LAB — PREPARE RBC (CROSSMATCH)

## 2014-06-11 LAB — MAGNESIUM: Magnesium: 1 mg/dL — ABNORMAL LOW (ref 1.5–2.5)

## 2014-06-11 LAB — GLUCOSE, CAPILLARY
Glucose-Capillary: 129 mg/dL — ABNORMAL HIGH (ref 70–99)
Glucose-Capillary: 104 mg/dL — ABNORMAL HIGH (ref 70–99)

## 2014-06-11 MED ORDER — MAGNESIUM SULFATE 4000MG/100ML IJ SOLN
4.0000 g | Freq: Once | INTRAMUSCULAR | Status: AC
  Administered 2014-06-11: 4 g via INTRAVENOUS
  Filled 2014-06-11: qty 100

## 2014-06-11 MED ORDER — FUROSEMIDE 10 MG/ML IJ SOLN
40.0000 mg | Freq: Once | INTRAMUSCULAR | Status: AC
  Administered 2014-06-11: 40 mg via INTRAVENOUS
  Filled 2014-06-11: qty 4

## 2014-06-11 NOTE — Progress Notes (Signed)
IP PROGRESS NOTE  Subjective:   She reports some blood when "wiping "after urinating. No other bleeding. Approximately 2 bowel movements yesterday. She complains of lower leg and foot swelling.  Objective: Vital signs in last 24 hours: Blood pressure 133/52, pulse 83, temperature 99.3 F (37.4 C), temperature source Oral, resp. rate 20, height 5\' 2"  (1.575 m), weight 153 lb (69.4 kg), SpO2 99.00%.  Intake/Output from previous day: 07/04 0701 - 07/05 0700 In: 730 [P.O.:730] Out: -   Physical Exam:  HEENT: No thrush or bleeding Lungs: Clear bilaterally Cardiac: Irregular Abdomen: Soft, mild tenderness in the right upper quadrant Extremities: 1+ pitting edema at the right greater than left lower leg and foot Skin: Small ecchymoses over the arms, petechial rash at the dorsum of the foot bilaterally  Portacath/PICC-without erythema  Lab Results:  Recent Labs  06/10/14 0525 06/10/14 1540 06/11/14 0530  WBC 0.3*  --  0.4*  HGB 7.1*  --  7.2*  HCT 20.3*  --  20.8*  PLT 5* 11* 12*    BMET  Recent Labs  06/10/14 0525 06/11/14 0530  NA 142 143  K 3.7 3.7  CL 106 106  CO2 23 22  GLUCOSE 106* 106*  BUN 36* 32*  CREATININE 1.46* 1.46*  CALCIUM 7.1* 7.4*    Studies/Results: No results found.  Medications: I have reviewed the patient's current medications.  Assessment/Plan:  1. Acute myelogenous leukemia, status post cycle 1 high-dose cytarabine consolidation therapy, currently day 13. 2. Severe pancytopenia secondary chemotherapy-the platelets and white count are slightly higher today, persistent severe anemia 3. Diabetes-currently maintained off of metformin secondary to recent hypoglycemia  4. atrial fibrillation  5. history of coronary artery disease  6. chronic renal failure  7. Hypertension  8. Hyperlipidemia  9. diarrhea-stool positive for the C. difficile toxin, on Flagyl 10. Hypokalemia-improved 11. Leg/foot edema secondary to hypoalbuminemia and  intravenous hydration  The diarrhea is improving. She will continue Flagyl.  The platelet count is slightly higher today.  We will decrease the IV fluids and give a dose of Lasix secondary to the increased lower leg and foot edema.  She will be transfused with packed red blood cells today.      LOS: 3 days   [place]  06/11/2014, 8:40 AM

## 2014-06-12 ENCOUNTER — Other Ambulatory Visit: Payer: PRIVATE HEALTH INSURANCE

## 2014-06-12 ENCOUNTER — Other Ambulatory Visit: Payer: Commercial Managed Care - HMO

## 2014-06-12 DIAGNOSIS — E8809 Other disorders of plasma-protein metabolism, not elsewhere classified: Secondary | ICD-10-CM

## 2014-06-12 LAB — CBC WITH DIFFERENTIAL/PLATELET
Basophils Absolute: 0 10*3/uL (ref 0.0–0.1)
Basophils Relative: 0 % (ref 0–1)
Eosinophils Absolute: 0 10*3/uL (ref 0.0–0.7)
Eosinophils Relative: 0 % (ref 0–5)
HCT: 27.3 % — ABNORMAL LOW (ref 36.0–46.0)
Hemoglobin: 9.6 g/dL — ABNORMAL LOW (ref 12.0–15.0)
LYMPHS ABS: 0.3 10*3/uL — AB (ref 0.7–4.0)
Lymphocytes Relative: 97 % — ABNORMAL HIGH (ref 12–46)
MCH: 28.9 pg (ref 26.0–34.0)
MCHC: 35.2 g/dL (ref 30.0–36.0)
MCV: 82.2 fL (ref 78.0–100.0)
MONO ABS: 0 10*3/uL — AB (ref 0.1–1.0)
Monocytes Relative: 0 % — ABNORMAL LOW (ref 3–12)
Neutro Abs: 0 10*3/uL — ABNORMAL LOW (ref 1.7–7.7)
Neutrophils Relative %: 3 % — ABNORMAL LOW (ref 43–77)
RBC: 3.32 MIL/uL — ABNORMAL LOW (ref 3.87–5.11)
RDW: 15.8 % — AB (ref 11.5–15.5)
WBC: 0.3 10*3/uL — CL (ref 4.0–10.5)

## 2014-06-12 LAB — TYPE AND SCREEN
ABO/RH(D): O NEG
ANTIBODY SCREEN: NEGATIVE
UNIT DIVISION: 0
UNIT DIVISION: 0
Unit division: 0
Unit division: 0

## 2014-06-12 LAB — BASIC METABOLIC PANEL
Anion gap: 15 (ref 5–15)
BUN: 29 mg/dL — AB (ref 6–23)
CALCIUM: 8.3 mg/dL — AB (ref 8.4–10.5)
CO2: 22 mEq/L (ref 19–32)
CREATININE: 1.45 mg/dL — AB (ref 0.50–1.10)
Chloride: 102 mEq/L (ref 96–112)
GFR, EST AFRICAN AMERICAN: 41 mL/min — AB (ref >90)
GFR, EST NON AFRICAN AMERICAN: 35 mL/min — AB (ref >90)
Glucose, Bld: 130 mg/dL — ABNORMAL HIGH (ref 70–99)
Potassium: 3.6 mEq/L — ABNORMAL LOW (ref 3.7–5.3)
Sodium: 139 mEq/L (ref 137–147)

## 2014-06-12 LAB — GLUCOSE, CAPILLARY: Glucose-Capillary: 136 mg/dL — ABNORMAL HIGH (ref 70–99)

## 2014-06-12 LAB — MAGNESIUM: Magnesium: 1.7 mg/dL (ref 1.5–2.5)

## 2014-06-12 MED ORDER — FUROSEMIDE 40 MG PO TABS
40.0000 mg | ORAL_TABLET | Freq: Two times a day (BID) | ORAL | Status: DC
  Administered 2014-06-12 – 2014-06-13 (×2): 40 mg via ORAL
  Filled 2014-06-12 (×4): qty 1

## 2014-06-12 MED ORDER — ACYCLOVIR 400 MG PO TABS
400.0000 mg | ORAL_TABLET | Freq: Every day | ORAL | Status: DC
  Administered 2014-06-12 – 2014-06-16 (×5): 400 mg via ORAL
  Filled 2014-06-12 (×5): qty 1

## 2014-06-12 MED ORDER — SODIUM CHLORIDE 0.9 % IV SOLN
INTRAVENOUS | Status: DC
  Administered 2014-06-12: 12:00:00 via INTRAVENOUS
  Filled 2014-06-12 (×2): qty 1000

## 2014-06-12 NOTE — Progress Notes (Addendum)
Bilateral edema Lt >Rt. Pitting edema 2 +. Pt received 1 unit of platelets, tolerated well. Appetite poor. Eating applesauce for dinner. Pt states her BM is not as loose.

## 2014-06-12 NOTE — Progress Notes (Signed)
IP PROGRESS NOTE  Subjective:   The diarrhea has resolved. No bleeding except when she has, to the arms. She reports urinating following Lasix, but the leg edema has not improved.  Objective: Vital signs in last 24 hours: Blood pressure 128/55, pulse 81, temperature 99.2 F (37.3 C), temperature source Oral, resp. rate 20, height 5\' 2"  (1.575 m), weight 153 lb (69.4 kg), SpO2 99.00%.  Intake/Output from previous day: 07/05 0701 - 07/06 0700 In: 720 [P.O.:720] Out: -   Physical Exam:  HEENT: No thrush or bleeding Lungs: Clear bilaterally Cardiac: Irregular Abdomen: Soft, mild upper abdomen tenderness bilaterally Extremities: 1+ pitting edema at the right greater than left lower leg and foot Skin: Small ecchymoses over the arms, petechial rash at the dorsum of the foot bilaterally  Portacath/PICC-without erythema  Lab Results:  Recent Labs  06/11/14 0530 06/12/14 0530  WBC 0.4* 0.3*  HGB 7.2* 9.6*  HCT 20.8* 27.3*  PLT 12* <5*    BMET  Recent Labs  06/11/14 0530 06/12/14 0530  NA 143 139  K 3.7 3.6*  CL 106 102  CO2 22 22  GLUCOSE 106* 130*  BUN 32* 29*  CREATININE 1.46* 1.45*  CALCIUM 7.4* 8.3*    Studies/Results: No results found.  Medications: I have reviewed the patient's current medications.  Assessment/Plan:  1. Acute myelogenous leukemia, status post cycle 1 high-dose cytarabine consolidation therapy, currently day 14. 2. Severe pancytopenia secondary chemotherapy-the hemoglobin is higher after the red cell transfusion 06/11/2014. Persistent severe neutropenia/thrombocytopenia. 3. Diabetes-currently maintained off of metformin secondary to recent hypoglycemia  4. atrial fibrillation  5. history of coronary artery disease  6. chronic renal failure  7. Hypertension  8. Hyperlipidemia  9. diarrhea-stool positive for the C. difficile toxin, on Flagyl 10. Hypokalemia-improved 11. Leg/foot edema secondary to hypoalbuminemia and intravenous  hydration-I will add Lasix  She appears stable. There is persistent severe neutropenia/thrombocytopenia. [name] will remain in the hospital until her counts began to rise.     LOS: 4 days   [name]  06/12/2014, 10:11 AM

## 2014-06-13 ENCOUNTER — Telehealth: Payer: Self-pay | Admitting: Internal Medicine

## 2014-06-13 DIAGNOSIS — I129 Hypertensive chronic kidney disease with stage 1 through stage 4 chronic kidney disease, or unspecified chronic kidney disease: Secondary | ICD-10-CM

## 2014-06-13 LAB — BASIC METABOLIC PANEL
ANION GAP: 13 (ref 5–15)
BUN: 25 mg/dL — ABNORMAL HIGH (ref 6–23)
CHLORIDE: 105 meq/L (ref 96–112)
CO2: 23 mEq/L (ref 19–32)
Calcium: 7.6 mg/dL — ABNORMAL LOW (ref 8.4–10.5)
Creatinine, Ser: 1.31 mg/dL — ABNORMAL HIGH (ref 0.50–1.10)
GFR calc non Af Amer: 40 mL/min — ABNORMAL LOW (ref >90)
GFR, EST AFRICAN AMERICAN: 46 mL/min — AB (ref >90)
Glucose, Bld: 119 mg/dL — ABNORMAL HIGH (ref 70–99)
POTASSIUM: 3.4 meq/L — AB (ref 3.7–5.3)
Sodium: 141 mEq/L (ref 137–147)

## 2014-06-13 LAB — MAGNESIUM: MAGNESIUM: 1.2 mg/dL — AB (ref 1.5–2.5)

## 2014-06-13 LAB — GLUCOSE, CAPILLARY: GLUCOSE-CAPILLARY: 174 mg/dL — AB (ref 70–99)

## 2014-06-13 LAB — CBC WITH DIFFERENTIAL/PLATELET
BASOS ABS: 0 10*3/uL (ref 0.0–0.1)
Basophils Relative: 0 % (ref 0–1)
Eosinophils Absolute: 0 10*3/uL (ref 0.0–0.7)
Eosinophils Relative: 0 % (ref 0–5)
HEMATOCRIT: 24.8 % — AB (ref 36.0–46.0)
Hemoglobin: 8.7 g/dL — ABNORMAL LOW (ref 12.0–15.0)
LYMPHS ABS: 0.2 10*3/uL — AB (ref 0.7–4.0)
Lymphocytes Relative: 100 % — ABNORMAL HIGH (ref 12–46)
MCH: 29.1 pg (ref 26.0–34.0)
MCHC: 35.1 g/dL (ref 30.0–36.0)
MCV: 82.9 fL (ref 78.0–100.0)
MONOS PCT: 0 % — AB (ref 3–12)
Monocytes Absolute: 0 10*3/uL — ABNORMAL LOW (ref 0.1–1.0)
Neutro Abs: 0 10*3/uL — ABNORMAL LOW (ref 1.7–7.7)
Neutrophils Relative %: 0 % — ABNORMAL LOW (ref 43–77)
PLATELETS: 21 10*3/uL — AB (ref 150–400)
RBC: 2.99 MIL/uL — ABNORMAL LOW (ref 3.87–5.11)
RDW: 15.5 % (ref 11.5–15.5)
WBC: 0.2 10*3/uL — AB (ref 4.0–10.5)

## 2014-06-13 LAB — PREPARE PLATELET PHERESIS: UNIT DIVISION: 0

## 2014-06-13 LAB — PATHOLOGIST SMEAR REVIEW

## 2014-06-13 MED ORDER — MAGNESIUM SULFATE 4000MG/100ML IJ SOLN
4.0000 g | Freq: Once | INTRAMUSCULAR | Status: AC
  Administered 2014-06-13: 4 g via INTRAVENOUS
  Filled 2014-06-13: qty 100

## 2014-06-13 MED ORDER — FUROSEMIDE 40 MG PO TABS
40.0000 mg | ORAL_TABLET | Freq: Every day | ORAL | Status: DC
  Administered 2014-06-14 – 2014-06-16 (×3): 40 mg via ORAL
  Filled 2014-06-13 (×3): qty 1

## 2014-06-13 MED ORDER — POTASSIUM CHLORIDE CRYS ER 20 MEQ PO TBCR
20.0000 meq | EXTENDED_RELEASE_TABLET | Freq: Every day | ORAL | Status: DC
  Administered 2014-06-13 – 2014-06-15 (×3): 20 meq via ORAL
  Filled 2014-06-13 (×4): qty 1

## 2014-06-13 NOTE — Telephone Encounter (Signed)
Error/gd °

## 2014-06-13 NOTE — Progress Notes (Signed)
Came to visit patient at bedside to offer and explain [facility] Care Management services. She states " I am not interested" and did not want to hear further about the program. She was pleasant however. Accepted [facility] Care Management brochure and states she will call in case she changes her mind in the future. Made inpatient RNCM aware.  Marthenia Rolling, [facility] [phone]

## 2014-06-13 NOTE — Progress Notes (Signed)
IP PROGRESS NOTE  Subjective:  No diarrhea. No bleeding. She reports frequent urination during the night after receiving Lasix.  Objective: Vital signs in last 24 hours: Blood pressure 144/63, pulse 101, temperature 99 F (37.2 C), temperature source Oral, resp. rate 16, height 5\' 2"  (1.575 m), weight 153 lb (69.4 kg), SpO2 97.00%.  Intake/Output from previous day: 07/06 0701 - 07/07 0700 In: 2010.5 [P.O.:1320; I.V.:298; Blood:392.5] Out: -   Physical Exam:  HEENT: No thrush or bleeding Lungs: Coarse inspiratory rales at the right upper chest, no respiratory Cardiac: Irregular Abdomen: Soft, nontender Extremities: 1+ pitting edema at the right greater than left lower leg and foot-partially improved Skin: Small ecchymoses over the arms, petechial rash at the dorsum of the foot bilaterally  Portacath/PICC-without erythema  Lab Results:  Recent Labs  06/12/14 0530 06/13/14 0530  WBC 0.3* 0.2*  HGB 9.6* 8.7*  HCT 27.3* 24.8*  PLT <5* 21*    BMET  Recent Labs  06/12/14 0530 06/13/14 0530  NA 139 141  K 3.6* 3.4*  CL 102 105  CO2 22 23  GLUCOSE 130* 119*  BUN 29* 25*  CREATININE 1.45* 1.31*  CALCIUM 8.3* 7.6*    Studies/Results: No results found.  Medications: I have reviewed the patient's current medications.  Assessment/Plan:  1. Acute myelogenous leukemia, status post cycle 1 high-dose cytarabine consolidation therapy, currently day 15. 2. Severe pancytopenia secondary chemotherapy-the hemoglobin is higher after the red cell transfusion 06/11/2014. Persistent severe neutropenia, platelets are better today 3. Diabetes-currently maintained off of metformin secondary to recent hypoglycemia  4. atrial fibrillation  5. history of coronary artery disease  6. chronic renal failure  7. Hypertension  8. Hyperlipidemia  9. diarrhea-stool positive for the C. difficile toxin, on Flagyl, day 5 10. Hypokalemia/hypomagnesemia-replete magnesium IV, and oral  potassium 11. Leg/foot edema secondary to hypoalbuminemia and intravenous hydration-continue daily Lasix  She appears stable. There is persistent severe neutropenia. Plan to consider discharge to home if the platelet count remains adequate on 06/14/2014.    LOS: 5 days   [name]  06/13/2014, 8:09 AM

## 2014-06-14 LAB — CBC WITH DIFFERENTIAL/PLATELET
Basophils Absolute: 0 10*3/uL (ref 0.0–0.1)
Basophils Relative: 0 % (ref 0–1)
EOS ABS: 0 10*3/uL (ref 0.0–0.7)
Eosinophils Relative: 0 % (ref 0–5)
HCT: 26.6 % — ABNORMAL LOW (ref 36.0–46.0)
Hemoglobin: 9.1 g/dL — ABNORMAL LOW (ref 12.0–15.0)
Lymphocytes Relative: 97 % — ABNORMAL HIGH (ref 12–46)
Lymphs Abs: 0.3 10*3/uL — ABNORMAL LOW (ref 0.7–4.0)
MCH: 28.5 pg (ref 26.0–34.0)
MCHC: 34.2 g/dL (ref 30.0–36.0)
MCV: 83.4 fL (ref 78.0–100.0)
MONO ABS: 0 10*3/uL — AB (ref 0.1–1.0)
Monocytes Relative: 3 % (ref 3–12)
NEUTROS PCT: 0 % — AB (ref 43–77)
Neutro Abs: 0 10*3/uL — ABNORMAL LOW (ref 1.7–7.7)
PLATELETS: 16 10*3/uL — AB (ref 150–400)
RBC: 3.19 MIL/uL — ABNORMAL LOW (ref 3.87–5.11)
RDW: 15.1 % (ref 11.5–15.5)
WBC: 0.3 10*3/uL — CL (ref 4.0–10.5)

## 2014-06-14 LAB — BASIC METABOLIC PANEL
ANION GAP: 12 (ref 5–15)
BUN: 22 mg/dL (ref 6–23)
CALCIUM: 8.5 mg/dL (ref 8.4–10.5)
CO2: 23 mEq/L (ref 19–32)
CREATININE: 1.3 mg/dL — AB (ref 0.50–1.10)
Chloride: 103 mEq/L (ref 96–112)
GFR, EST AFRICAN AMERICAN: 46 mL/min — AB (ref >90)
GFR, EST NON AFRICAN AMERICAN: 40 mL/min — AB (ref >90)
Glucose, Bld: 149 mg/dL — ABNORMAL HIGH (ref 70–99)
Potassium: 3.4 mEq/L — ABNORMAL LOW (ref 3.7–5.3)
Sodium: 138 mEq/L (ref 137–147)

## 2014-06-14 LAB — GLUCOSE, CAPILLARY: GLUCOSE-CAPILLARY: 149 mg/dL — AB (ref 70–99)

## 2014-06-14 LAB — MAGNESIUM: Magnesium: 2 mg/dL (ref 1.5–2.5)

## 2014-06-14 NOTE — Care Management Note (Signed)
    Page 1 of 1   06/14/2014     12:46:08 PM CARE MANAGEMENT NOTE 06/14/2014  Patient:  [name], [name]   Account Number:  [id number]  Date Initiated:  06/12/2014  Documentation initiated by:  [facility]  Subjective/Objective Assessment:   73 year old female admitted with pancytopenia.     Action/Plan:   From home.   Anticipated DC Date:  06/15/2014   Anticipated DC Plan:  [place]  CM consult      Choice offered to / List presented to:             Status of service:  Completed, signed off Medicare Important Message given?   (If response is "NO", the following Medicare IM given date fields will be blank) Date Medicare IM given:   Medicare IM given by:   Date Additional Medicare IM given:   Additional Medicare IM given by:    Discharge Disposition:  HOME/SELF CARE  Per UR Regulation:  Reviewed for med. necessity/level of care/duration of stay  If discussed at [place] of Stay Meetings, dates discussed:    Comments:  06/14/14 12:40 CM met with pt in room to discuss discharge needs.  Pt states she doe not need any help at home.  Pt states her husband is able to help her and does not wish to have any agency come into her home.  No other CM needs were communicated.  [name] Masters, BSN, CM [phone].

## 2014-06-14 NOTE — Progress Notes (Signed)
IP PROGRESS NOTE  Subjective:  No complaint. No diarrhea or bleeding  Objective: Vital signs in last 24 hours: Blood pressure 145/59, pulse 79, temperature 98.6 F (37 C), temperature source Oral, resp. rate 20, height 5\' 2"  (1.575 m), weight 153 lb (69.4 kg), SpO2 98.00%.  Intake/Output from previous day: 07/07 0701 - 07/08 0700 In: 480 [P.O.:480] Out: -   Physical Exam:  HEENT: No thrush or bleeding Lungs: Few end inspiratory rales at the posterior basis, no respiratory distress Cardiac: Regular rhythm with premature beats Abdomen: Soft, nontender Extremities: 1+ pitting edema at the right greater than left lower leg and foot-partially improved Skin: Small ecchymoses over the arms, petechial rash at the dorsum of the foot bilaterally  Portacath/PICC-without erythema  Lab Results:  Recent Labs  06/13/14 0530 06/14/14 0540  WBC 0.2* 0.3*  HGB 8.7* 9.1*  HCT 24.8* 26.6*  PLT 21* 16*    BMET  Recent Labs  06/13/14 0530 06/14/14 0540  NA 141 138  K 3.4* 3.4*  CL 105 103  CO2 23 23  GLUCOSE 119* 149*  BUN 25* 22  CREATININE 1.31* 1.30*  CALCIUM 7.6* 8.5    Studies/Results: No results found.  Medications: I have reviewed the patient's current medications.  Assessment/Plan:  1. Acute myelogenous leukemia, status post cycle 1 high-dose cytarabine consolidation therapy, currently day 16. 2. Severe pancytopenia secondary chemotherapy-the hemoglobin is higher after the red cell transfusion 06/11/2014. Persistent severe neutropenia, platelets are adequate today 3. Diabetes-currently maintained off of metformin secondary to recent hypoglycemia  4. atrial fibrillation  5. history of coronary artery disease  6. chronic renal failure  7. Hypertension  8. Hyperlipidemia  9. diarrhea-stool positive for the C. difficile toxin, on Flagyl, day 6 10. Hypokalemia/hypomagnesemia-improved 11. Leg/foot edema secondary to hypoalbuminemia and intravenous hydration-continue  daily Lasix  She appears stable. There is persistent severe neutropenia. Plan to consider discharge to home if the platelet count remains adequate on 06/15/2014.    LOS: 6 days   Taysen Bushart, Dominica Severin  06/14/2014, 9:36 AM

## 2014-06-15 ENCOUNTER — Ambulatory Visit: Payer: Commercial Managed Care - HMO | Admitting: Internal Medicine

## 2014-06-15 ENCOUNTER — Other Ambulatory Visit: Payer: Commercial Managed Care - HMO

## 2014-06-15 DIAGNOSIS — R609 Edema, unspecified: Secondary | ICD-10-CM

## 2014-06-15 DIAGNOSIS — N189 Chronic kidney disease, unspecified: Secondary | ICD-10-CM

## 2014-06-15 DIAGNOSIS — I10 Essential (primary) hypertension: Secondary | ICD-10-CM

## 2014-06-15 DIAGNOSIS — E119 Type 2 diabetes mellitus without complications: Secondary | ICD-10-CM

## 2014-06-15 DIAGNOSIS — D61818 Other pancytopenia: Secondary | ICD-10-CM

## 2014-06-15 DIAGNOSIS — E876 Hypokalemia: Secondary | ICD-10-CM

## 2014-06-15 DIAGNOSIS — A0472 Enterocolitis due to Clostridium difficile, not specified as recurrent: Secondary | ICD-10-CM

## 2014-06-15 DIAGNOSIS — I4891 Unspecified atrial fibrillation: Secondary | ICD-10-CM

## 2014-06-15 DIAGNOSIS — E785 Hyperlipidemia, unspecified: Secondary | ICD-10-CM

## 2014-06-15 DIAGNOSIS — D696 Thrombocytopenia, unspecified: Secondary | ICD-10-CM

## 2014-06-15 DIAGNOSIS — C9200 Acute myeloblastic leukemia, not having achieved remission: Secondary | ICD-10-CM

## 2014-06-15 DIAGNOSIS — Z0289 Encounter for other administrative examinations: Secondary | ICD-10-CM

## 2014-06-15 DIAGNOSIS — D709 Neutropenia, unspecified: Secondary | ICD-10-CM

## 2014-06-15 LAB — BASIC METABOLIC PANEL
ANION GAP: 12 (ref 5–15)
BUN: 20 mg/dL (ref 6–23)
CALCIUM: 8.4 mg/dL (ref 8.4–10.5)
CO2: 23 mEq/L (ref 19–32)
Chloride: 103 mEq/L (ref 96–112)
Creatinine, Ser: 1.32 mg/dL — ABNORMAL HIGH (ref 0.50–1.10)
GFR calc Af Amer: 45 mL/min — ABNORMAL LOW (ref >90)
GFR calc non Af Amer: 39 mL/min — ABNORMAL LOW (ref >90)
GLUCOSE: 124 mg/dL — AB (ref 70–99)
Potassium: 3.4 mEq/L — ABNORMAL LOW (ref 3.7–5.3)
SODIUM: 138 meq/L (ref 137–147)

## 2014-06-15 LAB — CBC WITH DIFFERENTIAL/PLATELET
BASOS ABS: 0 10*3/uL (ref 0.0–0.1)
Basophils Relative: 0 % (ref 0–1)
EOS ABS: 0 10*3/uL (ref 0.0–0.7)
Eosinophils Relative: 0 % (ref 0–5)
HEMATOCRIT: 26.2 % — AB (ref 36.0–46.0)
Hemoglobin: 8.9 g/dL — ABNORMAL LOW (ref 12.0–15.0)
LYMPHS PCT: 78 % — AB (ref 12–46)
Lymphs Abs: 0.4 10*3/uL — ABNORMAL LOW (ref 0.7–4.0)
MCH: 28.4 pg (ref 26.0–34.0)
MCHC: 34 g/dL (ref 30.0–36.0)
MCV: 83.7 fL (ref 78.0–100.0)
Monocytes Absolute: 0.1 10*3/uL (ref 0.1–1.0)
Monocytes Relative: 14 % — ABNORMAL HIGH (ref 3–12)
NEUTROS ABS: 0 10*3/uL — AB (ref 1.7–7.7)
Neutrophils Relative %: 8 % — ABNORMAL LOW (ref 43–77)
Platelets: 11 10*3/uL — CL (ref 150–400)
RBC: 3.13 MIL/uL — ABNORMAL LOW (ref 3.87–5.11)
RDW: 15 % (ref 11.5–15.5)
WBC: 0.5 10*3/uL — CL (ref 4.0–10.5)

## 2014-06-15 LAB — MAGNESIUM: MAGNESIUM: 1.5 mg/dL (ref 1.5–2.5)

## 2014-06-15 LAB — GLUCOSE, CAPILLARY: Glucose-Capillary: 162 mg/dL — ABNORMAL HIGH (ref 70–99)

## 2014-06-15 NOTE — Progress Notes (Signed)
IP PROGRESS NOTE  Subjective:  Her taste is abnormal.. No diarrhea or bleeding  Objective: Vital signs in last 24 hours: Blood pressure 120/60, pulse 80, temperature 98.2 F (36.8 C), temperature source Oral, resp. rate 20, height 5\' 2"  (1.575 m), weight 153 lb (69.4 kg), SpO2 97.00%.  Intake/Output from previous day: 07/08 0701 - 07/09 0700 In: 875 [P.O.:360; I.V.:515] Out: -   Physical Exam:  HEENT: No thrush or bleeding Lungs: Few end inspiratory rales at the posterior bases, no respiratory distress Cardiac: Irregular Abdomen: Soft, nontender Extremities: 1+ pitting edema at the right greater than left lower leg and foot Skin: Small ecchymoses over the arms, petechial rash at the dorsum of the foot bilaterally  Portacath/PICC-without erythema  Lab Results:  Recent Labs  06/14/14 0540 06/15/14 0500  WBC 0.3* 0.5*  HGB 9.1* 8.9*  HCT 26.6* 26.2*  PLT 16* 11*    BMET  Recent Labs  06/14/14 0540 06/15/14 0500  NA 138 138  K 3.4* 3.4*  CL 103 103  CO2 23 23  GLUCOSE 149* 124*  BUN 22 20  CREATININE 1.30* 1.32*  CALCIUM 8.5 8.4   magnesium 1.5  Studies/Results: No results found.  Medications: I have reviewed the patient's current medications.  Assessment/Plan:  1. Acute myelogenous leukemia, status post cycle 1 high-dose cytarabine consolidation therapy, currently day 17. 2. Severe pancytopenia secondary chemotherapy-the hemoglobin is higher after the red cell transfusion 06/11/2014. Persistent severe neutropenia, platelets are lower today 3. Diabetes-currently maintained off of metformin secondary to recent hypoglycemia  4. atrial fibrillation  5. history of coronary artery disease  6. chronic renal failure  7. Hypertension  8. Hyperlipidemia  9. diarrhea-stool positive for the C. difficile toxin, on Flagyl, day 7 10. Hypokalemia/hypomagnesemia-improved 11. Leg/foot edema secondary to hypoalbuminemia and intravenous hydration-continue daily  Lasix  She appears stable. There is persistent severe neutropenia. Plan to consider discharge to home if the platelet count remains adequate and the white count is higher on 06/16/2014. I encouraged her to increase ambulation.    LOS: 7 days   Ashlye Oviedo  06/15/2014, 5:07 PM

## 2014-06-16 ENCOUNTER — Other Ambulatory Visit: Payer: Self-pay | Admitting: Oncology

## 2014-06-16 ENCOUNTER — Telehealth: Payer: Self-pay | Admitting: *Deleted

## 2014-06-16 ENCOUNTER — Other Ambulatory Visit: Payer: Self-pay | Admitting: *Deleted

## 2014-06-16 DIAGNOSIS — C9500 Acute leukemia of unspecified cell type not having achieved remission: Secondary | ICD-10-CM

## 2014-06-16 DIAGNOSIS — I2510 Atherosclerotic heart disease of native coronary artery without angina pectoris: Secondary | ICD-10-CM

## 2014-06-16 LAB — CBC WITH DIFFERENTIAL/PLATELET
BASOS PCT: 0 % (ref 0–1)
Basophils Absolute: 0 10*3/uL (ref 0.0–0.1)
EOS ABS: 0 10*3/uL (ref 0.0–0.7)
Eosinophils Relative: 0 % (ref 0–5)
HEMATOCRIT: 25.4 % — AB (ref 36.0–46.0)
HEMOGLOBIN: 8.8 g/dL — AB (ref 12.0–15.0)
LYMPHS ABS: 0.5 10*3/uL — AB (ref 0.7–4.0)
Lymphocytes Relative: 50 % — ABNORMAL HIGH (ref 12–46)
MCH: 29 pg (ref 26.0–34.0)
MCHC: 34.6 g/dL (ref 30.0–36.0)
MCV: 83.8 fL (ref 78.0–100.0)
MONO ABS: 0.2 10*3/uL (ref 0.1–1.0)
Monocytes Relative: 25 % — ABNORMAL HIGH (ref 3–12)
Neutro Abs: 0.2 10*3/uL — ABNORMAL LOW (ref 1.7–7.7)
Neutrophils Relative %: 25 % — ABNORMAL LOW (ref 43–77)
Platelets: 6 10*3/uL — CL (ref 150–400)
RBC: 3.03 MIL/uL — ABNORMAL LOW (ref 3.87–5.11)
RDW: 14.9 % (ref 11.5–15.5)
WBC: 0.9 10*3/uL — CL (ref 4.0–10.5)

## 2014-06-16 LAB — BASIC METABOLIC PANEL
Anion gap: 13 (ref 5–15)
BUN: 17 mg/dL (ref 6–23)
CALCIUM: 8.6 mg/dL (ref 8.4–10.5)
CO2: 22 meq/L (ref 19–32)
CREATININE: 1.27 mg/dL — AB (ref 0.50–1.10)
Chloride: 105 mEq/L (ref 96–112)
GFR calc Af Amer: 48 mL/min — ABNORMAL LOW (ref >90)
GFR calc non Af Amer: 41 mL/min — ABNORMAL LOW (ref >90)
GLUCOSE: 118 mg/dL — AB (ref 70–99)
Potassium: 3.4 mEq/L — ABNORMAL LOW (ref 3.7–5.3)
Sodium: 140 mEq/L (ref 137–147)

## 2014-06-16 LAB — HEPATIC FUNCTION PANEL
AST: 7 U/L (ref 0–37)
Albumin: 2.4 g/dL — ABNORMAL LOW (ref 3.5–5.2)
Alkaline Phosphatase: 294 U/L — ABNORMAL HIGH (ref 39–117)
BILIRUBIN INDIRECT: 0.3 mg/dL (ref 0.3–0.9)
Bilirubin, Direct: 0.3 mg/dL (ref 0.0–0.3)
Total Bilirubin: 0.6 mg/dL (ref 0.3–1.2)
Total Protein: 5.5 g/dL — ABNORMAL LOW (ref 6.0–8.3)

## 2014-06-16 LAB — PLATELET COUNT: PLATELETS: 21 10*3/uL — AB (ref 150–400)

## 2014-06-16 LAB — GLUCOSE, CAPILLARY: GLUCOSE-CAPILLARY: 171 mg/dL — AB (ref 70–99)

## 2014-06-16 LAB — MAGNESIUM: Magnesium: 1.3 mg/dL — ABNORMAL LOW (ref 1.5–2.5)

## 2014-06-16 MED ORDER — FLUCONAZOLE 100 MG PO TABS: 100.0000 mg | ORAL_TABLET | Freq: Every day | ORAL | Status: DC

## 2014-06-16 MED ORDER — HEPARIN SOD (PORK) LOCK FLUSH 100 UNIT/ML IV SOLN
500.0000 [IU] | INTRAVENOUS | Status: DC | PRN
  Administered 2014-06-16: 500 [IU]
  Filled 2014-06-16: qty 5

## 2014-06-16 MED ORDER — HEPARIN SOD (PORK) LOCK FLUSH 100 UNIT/ML IV SOLN: 500.0000 [IU] | INTRAVENOUS | Status: DC

## 2014-06-16 MED ORDER — AMINOCAPROIC ACID 500 MG PO TABS: 1000.0000 mg | ORAL_TABLET | Freq: Three times a day (TID) | ORAL | Status: DC

## 2014-06-16 MED ORDER — ACYCLOVIR 400 MG PO TABS: 400.0000 mg | ORAL_TABLET | Freq: Every day | ORAL | Status: DC

## 2014-06-16 MED ORDER — POTASSIUM CHLORIDE CRYS ER 20 MEQ PO TBCR
20.0000 meq | EXTENDED_RELEASE_TABLET | Freq: Two times a day (BID) | ORAL | Status: DC
  Administered 2014-06-16: 20 meq via ORAL
  Filled 2014-06-16 (×2): qty 1

## 2014-06-16 MED ORDER — LEVOFLOXACIN 250 MG PO TABS: 250.0000 mg | ORAL_TABLET | Freq: Every day | ORAL | Status: DC

## 2014-06-16 MED ORDER — MAGNESIUM SULFATE 40 MG/ML IJ SOLN
2.0000 g | Freq: Once | INTRAMUSCULAR | Status: AC
  Administered 2014-06-16: 2 g via INTRAVENOUS
  Filled 2014-06-16: qty 50

## 2014-06-16 MED ORDER — MAGNESIUM OXIDE 400 (241.3 MG) MG PO TABS: 400.0000 mg | ORAL_TABLET | Freq: Two times a day (BID) | ORAL | Status: DC

## 2014-06-16 MED ORDER — POTASSIUM CHLORIDE CRYS ER 20 MEQ PO TBCR: 20.0000 meq | EXTENDED_RELEASE_TABLET | Freq: Every day | ORAL | Status: DC

## 2014-06-16 MED ORDER — MAGNESIUM OXIDE 400 (241.3 MG) MG PO TABS
400.0000 mg | ORAL_TABLET | Freq: Two times a day (BID) | ORAL | Status: DC
  Administered 2014-06-16: 400 mg via ORAL
  Filled 2014-06-16 (×2): qty 1

## 2014-06-16 MED ORDER — METRONIDAZOLE 500 MG PO TABS: 500.0000 mg | ORAL_TABLET | Freq: Three times a day (TID) | ORAL | Status: AC

## 2014-06-16 NOTE — Telephone Encounter (Signed)
Pt discharged from hospital today. Lab appt did not get scheduled before she left. Called pt with instructions to come in for lab 7/13 at 1100.

## 2014-06-16 NOTE — Progress Notes (Signed)
Patient discharge instructions given,discussed meds with patient and she verbalized understanding. Reminded the patient to call MD if she has bleeding and to keep up with her scheduled lab on [place] at the [facility] Porta cth site unremarkable, no bleeding noted. She said she is ready to go home. Awaiting for husband to pick her up. Stable._ Catherine Ano RN

## 2014-06-16 NOTE — Progress Notes (Signed)
Results of platelets called in to Dr. Learta Codding.  MD gave verbal order to d/c patient.Sandie Ano RN

## 2014-06-16 NOTE — Progress Notes (Signed)
Pt port cath due to be changed. Rn stated to pt the hospital policy of portha cath, but pt wanted dressing and biopatch changed and if not d/c 7/10 then needle changed. Port site was cleaned, dressing was changed and biopatch changed. Will notify morning nurse.

## 2014-06-16 NOTE — Progress Notes (Signed)
Dr. Learta Codding aware about port dsg being change last night by Justice Rocher RN except for the [place] needle,MD said it is okay to use port for the platelet transfusion, site unremarkable.Sandie Ano RN

## 2014-06-16 NOTE — Discharge Instructions (Signed)
Call for fever or bleeding

## 2014-06-16 NOTE — Discharge Summary (Signed)
Physician Discharge Summary  Patient ID: [name] @ATTENDINGNPI @ MRN: [id number] DOB/AGE: 15-Apr-1941 73 y.o.  Admit date: 06/08/2014 Discharge date: 06/16/2014  Discharge Diagnoses:  1. Acute myelogenous leukemia, status post cycle 1 high-dose araC consolidation 05/30/2014 2. pancytopenia secondary to chemotherapy-improved 3. C. difficile colitis-started on Flagyl 06/09/2014 4. Diabetes 5. renal insufficiency 6. Hypokalemia 7. Hypomagnesemia 8. atrial fibrillation 9. history of coronary artery disease  Discharged Condition: Improved  Discharge Labs:  potassium 3.4, creatinine 1.27, magnesium 1.3, white count 0.9, ANC 0.2, platelets 21  Significant Diagnostic Studies: None  Consults: None  Procedures:  Transfusion with packed red blood cells and platelets  Disposition: 01-Home or Self Care     Medication List    STOP taking these medications       aspirin 325 MG EC tablet      TAKE these medications       acetaminophen 325 MG tablet  Commonly known as:  TYLENOL  Take 2 tablets (650 mg total) by mouth every 6 (six) hours as needed for mild pain (or Fever >/= 101).     acyclovir 400 MG tablet  Commonly known as:  ZOVIRAX  Take 1 tablet (400 mg total) by mouth daily.     aminocaproic acid 500 MG tablet  Commonly known as:  AMICAR  Take 2 tablets (1,000 mg total) by mouth every 8 (eight) hours.     amLODipine 5 MG tablet  Commonly known as:  NORVASC  Take 5 mg by mouth daily.     fluconazole 100 MG tablet  Commonly known as:  DIFLUCAN  Take 1 tablet (100 mg total) by mouth daily.     levofloxacin 250 MG tablet  Commonly known as:  LEVAQUIN  Take 1 tablet (250 mg total) by mouth daily.     LORazepam 0.5 MG tablet  Commonly known as:  ATIVAN  Take 1 tablet (0.5 mg total) by mouth every 6 (six) hours as needed for anxiety.     magnesium oxide 400 (241.3 MG) MG tablet  Commonly known as:  MAG-OX  Take 1 tablet (400 mg total) by mouth 2 (two) times  daily.     metoprolol 50 MG tablet  Commonly known as:  LOPRESSOR  Take 50 mg by mouth 2 (two) times daily.     metroNIDAZOLE 500 MG tablet  Commonly known as:  FLAGYL  Take 1 tablet (500 mg total) by mouth every 8 (eight) hours.     nitroGLYCERIN 0.4 MG SL tablet  Commonly known as:  NITROSTAT  Place 1 tablet (0.4 mg total) under the tongue every 5 (five) minutes as needed for chest pain.     oxyCODONE-acetaminophen 7.5-325 MG per tablet  Commonly known as:  PERCOCET  Take 1 tablet by mouth every 4 (four) hours as needed for pain.     pantoprazole 40 MG tablet  Commonly known as:  PROTONIX  Take 1 tablet (40 mg total) by mouth daily.     polyethylene glycol packet  Commonly known as:  MIRALAX / GLYCOLAX  Take 17 g by mouth daily as needed for moderate constipation.     potassium chloride SA 20 MEQ tablet  Commonly known as:  K-DUR,KLOR-CON  Take 1 tablet (20 mEq total) by mouth daily.     promethazine 12.5 MG tablet  Commonly known as:  PHENERGAN  Take 2 tablets (25 mg total) by mouth every 6 (six) hours as needed for nausea.     simvastatin 20 MG tablet  Commonly  known as:  ZOCOR  Take 20 mg by mouth every evening.        Follow-up Information   Follow up with Betsy Coder, MD On 06/19/2014. (Dr. [facility] for lab visit 06/19/2014)    Specialty:  Oncology   Contact information:   [place] [address] [place] Hospital Course: [name] was admitted on 06/08/2014 with severe pancytopenia following high-dose araC consolidation initiated 05/30/2014. She had received Neulasta 06/06/2014. She was maintained on prophylactic acyclovir, Diflucan, and Levaquin throughout this hospital admission. She remained afebrile aside from a low-grade fever between 99 and 100 on a few occasions. She had diarrhea on hospital admission. A stool sample returned positive for the C. difficile toxin and she was treated with Flagyl. The  diarrhea resolved.  [name] received multiple red cell and platelet transfusions during this admission. The white count began to recover over the last 2 days of this hospital admission. There was initial evidence of alloimmunization to platelets, but the platelet count responded to transfusion 06/12/2014 and drifted down over the next few days. The platelets returned at 6000 the morning of 06/16/2014. She received a platelet transfusion and a post transfusion count returned at 21,000.  [name] had no bleeding or symptoms of a systemic infection. She appeared stable for discharge to home after the platelet transfusion 06/16/2014.  She received potassium and magnesium supplements. She developed pitting edema at the lower legs and was treated with furosemide diuresis. Edema persisted on the day of discharge.  [name] will return to the [facility] for a CBC 06/19/2014. She knows to call for a fever or bleeding.       Signed: Betsy Coder, MD 06/16/2014, 6:02 PM

## 2014-06-16 NOTE — Progress Notes (Signed)
Patient d/c home. Smiling. Appreciative of care given to her. -Sandie Ano RN

## 2014-06-16 NOTE — Progress Notes (Signed)
IP PROGRESS NOTE  Subjective:  No complaint.  Objective: Vital signs in last 24 hours: Blood pressure 128/59, pulse 88, temperature 98.4 F (36.9 C), temperature source Oral, resp. rate 16, height 5\' 2"  (1.575 m), weight 153 lb (69.4 kg), SpO2 99.00%.  Intake/Output from previous day: 07/09 0701 - 07/10 0700 In: 840 [P.O.:840] Out: -   Physical Exam:  HEENT: No thrush or bleeding Lungs: Clear bilaterally Cardiac: Irregular Extremities: 1+ pitting edema at the right greater than left lower leg and foot Skin: Small ecchymoses over the arms, petechial rash at the dorsum of the foot bilaterally  Portacath/PICC-without erythema  Lab Results:  Recent Labs  06/15/14 0500 06/16/14 0500  WBC 0.5* 0.9*  HGB 8.9* 8.8*  HCT 26.2* 25.4*  PLT 11* 6*    BMET  Recent Labs  06/15/14 0500 06/16/14 0500  NA 138 140  K 3.4* 3.4*  CL 103 105  CO2 23 22  GLUCOSE 124* 118*  BUN 20 17  CREATININE 1.32* 1.27*  CALCIUM 8.4 8.6   magnesium 1.3  Studies/Results: No results found.  Medications: I have reviewed the patient's current medications.  Assessment/Plan:  1. Acute myelogenous leukemia, status post cycle 1 high-dose cytarabine consolidation therapy, currently day 18. 2. Severe pancytopenia secondary chemotherapy-the hemoglobin is higher after the red cell transfusion 06/11/2014. The white count has bumped over the past 2 days. platelets are lower today 3. Diabetes-currently maintained off of metformin secondary to recent hypoglycemia  4. atrial fibrillation  5. history of coronary artery disease  6. chronic renal failure  7. Hypertension  8. Hyperlipidemia  9. diarrhea-stool positive for the C. difficile toxin, on Flagyl, day 7 10. Hypokalemia/hypomagnesemia-improved 11. Leg/foot edema secondary to hypoalbuminemia and intravenous hydration-continue daily Lasix  She appears stable. The white count appears to be increasing. The platelets remain low. She would like to  go home. We will transfuse platelets this morning. If there is a significant improvement in the platelet count she will be discharged to home with a followup lab planned for 06/19/2014.  LOS: 8 days   [name]  06/16/2014, 8:21 AM

## 2014-06-16 NOTE — Progress Notes (Signed)
Pt has a a critical Platelet of 6 and wbc 0.9 this A.M.

## 2014-06-17 ENCOUNTER — Telehealth: Payer: Self-pay | Admitting: Oncology

## 2014-06-17 NOTE — Telephone Encounter (Signed)
S/w the pt's husband and he is aware of the lab appt on 06/19/2014@11 :00am

## 2014-06-18 LAB — PREPARE PLATELET PHERESIS: Unit division: 0

## 2014-06-19 ENCOUNTER — Telehealth: Payer: Self-pay | Admitting: *Deleted

## 2014-06-19 ENCOUNTER — Encounter: Payer: Self-pay | Admitting: Internal Medicine

## 2014-06-19 ENCOUNTER — Other Ambulatory Visit (HOSPITAL_BASED_OUTPATIENT_CLINIC_OR_DEPARTMENT_OTHER): Payer: Medicare HMO

## 2014-06-19 ENCOUNTER — Other Ambulatory Visit: Payer: Medicare HMO

## 2014-06-19 DIAGNOSIS — C9200 Acute myeloblastic leukemia, not having achieved remission: Secondary | ICD-10-CM

## 2014-06-19 LAB — CBC WITH DIFFERENTIAL/PLATELET
BASO%: 0 % (ref 0.0–2.0)
BASOS ABS: 0 10*3/uL (ref 0.0–0.1)
EOS%: 0 % (ref 0.0–7.0)
Eosinophils Absolute: 0 10*3/uL (ref 0.0–0.5)
HEMATOCRIT: 30.4 % — AB (ref 34.8–46.6)
HEMOGLOBIN: 10.3 g/dL — AB (ref 11.6–15.9)
LYMPH%: 20 % (ref 14.0–49.7)
MCH: 28.3 pg (ref 25.1–34.0)
MCHC: 33.9 g/dL (ref 31.5–36.0)
MCV: 83.5 fL (ref 79.5–101.0)
MONO#: 1.3 10*3/uL — ABNORMAL HIGH (ref 0.1–0.9)
MONO%: 26.8 % — ABNORMAL HIGH (ref 0.0–14.0)
NEUT#: 2.5 10*3/uL (ref 1.5–6.5)
NEUT%: 53.2 % (ref 38.4–76.8)
PLATELETS: 17 10*3/uL — AB (ref 145–400)
RBC: 3.64 10*6/uL — ABNORMAL LOW (ref 3.70–5.45)
RDW: 14.9 % — ABNORMAL HIGH (ref 11.2–14.5)
WBC: 4.7 10*3/uL (ref 3.9–10.3)
lymph#: 0.9 10*3/uL (ref 0.9–3.3)

## 2014-06-19 LAB — COMPREHENSIVE METABOLIC PANEL (CC13)
ALK PHOS: 360 U/L — AB (ref 40–150)
ALT: 6 U/L (ref 0–55)
AST: 14 U/L (ref 5–34)
Albumin: 2.7 g/dL — ABNORMAL LOW (ref 3.5–5.0)
Anion Gap: 12 mEq/L — ABNORMAL HIGH (ref 3–11)
BUN: 9.6 mg/dL (ref 7.0–26.0)
CALCIUM: 8.2 mg/dL — AB (ref 8.4–10.4)
CO2: 27 mEq/L (ref 22–29)
Chloride: 105 mEq/L (ref 98–109)
Creatinine: 1.1 mg/dL (ref 0.6–1.1)
Glucose: 160 mg/dl — ABNORMAL HIGH (ref 70–140)
Potassium: 3.1 mEq/L — ABNORMAL LOW (ref 3.5–5.1)
Sodium: 143 mEq/L (ref 136–145)
Total Bilirubin: 0.53 mg/dL (ref 0.20–1.20)
Total Protein: 6 g/dL — ABNORMAL LOW (ref 6.4–8.3)

## 2014-06-19 LAB — MAGNESIUM (CC13): Magnesium: 1.2 mg/dl — CL (ref 1.5–2.5)

## 2014-06-19 NOTE — Telephone Encounter (Signed)
MD reviewed labs- CBC is stable. Needs to increase Ktab to 20 meq bid and Magnesium oxide to 800 mg twice daily. Recheck labs on Thursday at 11:30. May d/c Diflucan and Levaquin per Dr. [name]. Patient understands and agrees. Copy of labs sent to Dr. Abel Presto at [facility].

## 2014-06-20 ENCOUNTER — Telehealth: Payer: Self-pay | Admitting: *Deleted

## 2014-06-20 ENCOUNTER — Other Ambulatory Visit: Payer: Self-pay | Admitting: *Deleted

## 2014-06-20 DIAGNOSIS — C9200 Acute myeloblastic leukemia, not having achieved remission: Secondary | ICD-10-CM

## 2014-06-20 NOTE — Telephone Encounter (Signed)
Message from [place], [place] at [facility]. Asking if pt was given platelets and magnesium after labs on 06/19/14? Informed her that CBC will be checked again 7/16. Pt is taking oral magnesium BID. She will make Dr. Florene Glen aware.

## 2014-06-21 ENCOUNTER — Telehealth: Payer: Self-pay | Admitting: Oncology

## 2014-06-21 NOTE — Telephone Encounter (Signed)
Called to give pt next appt ordered. Gave appt for Lb/MD on 7/23 @ 12pm. Pt verbalized understanding.

## 2014-06-22 ENCOUNTER — Other Ambulatory Visit (HOSPITAL_BASED_OUTPATIENT_CLINIC_OR_DEPARTMENT_OTHER): Payer: Medicare HMO

## 2014-06-22 ENCOUNTER — Telehealth: Payer: Self-pay | Admitting: *Deleted

## 2014-06-22 DIAGNOSIS — C9200 Acute myeloblastic leukemia, not having achieved remission: Secondary | ICD-10-CM

## 2014-06-22 DIAGNOSIS — C9500 Acute leukemia of unspecified cell type not having achieved remission: Secondary | ICD-10-CM

## 2014-06-22 LAB — CBC WITH DIFFERENTIAL/PLATELET
BASO%: 0 % (ref 0.0–2.0)
Basophils Absolute: 0 10*3/uL (ref 0.0–0.1)
EOS%: 0 % (ref 0.0–7.0)
Eosinophils Absolute: 0 10*3/uL (ref 0.0–0.5)
HCT: 28.9 % — ABNORMAL LOW (ref 34.8–46.6)
HGB: 9.6 g/dL — ABNORMAL LOW (ref 11.6–15.9)
LYMPH%: 15 % (ref 14.0–49.7)
MCH: 28 pg (ref 25.1–34.0)
MCHC: 33.2 g/dL (ref 31.5–36.0)
MCV: 84.3 fL (ref 79.5–101.0)
MONO#: 2.4 10*3/uL — ABNORMAL HIGH (ref 0.1–0.9)
MONO%: 28.6 % — AB (ref 0.0–14.0)
NEUT#: 4.8 10*3/uL (ref 1.5–6.5)
NEUT%: 56.4 % (ref 38.4–76.8)
PLATELETS: 65 10*3/uL — AB (ref 145–400)
RBC: 3.43 10*6/uL — ABNORMAL LOW (ref 3.70–5.45)
RDW: 15 % — ABNORMAL HIGH (ref 11.2–14.5)
WBC: 8.5 10*3/uL (ref 3.9–10.3)
lymph#: 1.3 10*3/uL (ref 0.9–3.3)

## 2014-06-22 LAB — COMPREHENSIVE METABOLIC PANEL (CC13)
ALBUMIN: 2.7 g/dL — AB (ref 3.5–5.0)
ALK PHOS: 337 U/L — AB (ref 40–150)
ALT: <6 U/L (ref 0–55)
AST: 15 U/L (ref 5–34)
Anion Gap: 10 mEq/L (ref 3–11)
BUN: 12.2 mg/dL (ref 7.0–26.0)
CO2: 26 mEq/L (ref 22–29)
Calcium: 8.2 mg/dL — ABNORMAL LOW (ref 8.4–10.4)
Chloride: 106 mEq/L (ref 98–109)
Creatinine: 1.2 mg/dL — ABNORMAL HIGH (ref 0.6–1.1)
GLUCOSE: 202 mg/dL — AB (ref 70–140)
POTASSIUM: 4.3 meq/L (ref 3.5–5.1)
Sodium: 142 mEq/L (ref 136–145)
TOTAL PROTEIN: 6.1 g/dL — AB (ref 6.4–8.3)
Total Bilirubin: 0.47 mg/dL (ref 0.20–1.20)

## 2014-06-22 LAB — MAGNESIUM (CC13): Magnesium: 1.2 mg/dl — CL (ref 1.5–2.5)

## 2014-06-22 NOTE — Telephone Encounter (Signed)
Per Dr [name], labs reviewed from 7/16, pt is to continue mag oxide 800mg  BID, pt can take K dur 18meq 1 tablet daily, and she is to keep her lab appt as scheduled on 06/29/14.  Pt verbalized understanding.  SLJ

## 2014-06-27 ENCOUNTER — Telehealth: Payer: Self-pay | Admitting: Oncology

## 2014-06-27 NOTE — Telephone Encounter (Signed)
Faxed pt labs to [facility]

## 2014-06-29 ENCOUNTER — Telehealth: Payer: Self-pay | Admitting: *Deleted

## 2014-06-29 ENCOUNTER — Other Ambulatory Visit (HOSPITAL_BASED_OUTPATIENT_CLINIC_OR_DEPARTMENT_OTHER): Payer: Medicare HMO

## 2014-06-29 ENCOUNTER — Other Ambulatory Visit (HOSPITAL_BASED_OUTPATIENT_CLINIC_OR_DEPARTMENT_OTHER): Payer: Medicare HMO | Admitting: Oncology

## 2014-06-29 ENCOUNTER — Other Ambulatory Visit: Payer: Self-pay | Admitting: *Deleted

## 2014-06-29 ENCOUNTER — Telehealth: Payer: Self-pay | Admitting: Oncology

## 2014-06-29 ENCOUNTER — Ambulatory Visit (HOSPITAL_BASED_OUTPATIENT_CLINIC_OR_DEPARTMENT_OTHER): Payer: Medicare HMO | Admitting: Oncology

## 2014-06-29 VITALS — BP 176/70 | HR 83 | Temp 98.2°F | Resp 18 | Ht 62.0 in | Wt 145.1 lb

## 2014-06-29 DIAGNOSIS — C9500 Acute leukemia of unspecified cell type not having achieved remission: Secondary | ICD-10-CM

## 2014-06-29 DIAGNOSIS — C9200 Acute myeloblastic leukemia, not having achieved remission: Secondary | ICD-10-CM

## 2014-06-29 DIAGNOSIS — D61818 Other pancytopenia: Secondary | ICD-10-CM

## 2014-06-29 DIAGNOSIS — A0472 Enterocolitis due to Clostridium difficile, not specified as recurrent: Secondary | ICD-10-CM

## 2014-06-29 DIAGNOSIS — N289 Disorder of kidney and ureter, unspecified: Secondary | ICD-10-CM

## 2014-06-29 LAB — CBC WITH DIFFERENTIAL/PLATELET
BASO%: 0.5 % (ref 0.0–2.0)
Basophils Absolute: 0 10*3/uL (ref 0.0–0.1)
EOS%: 0 % (ref 0.0–7.0)
Eosinophils Absolute: 0 10*3/uL (ref 0.0–0.5)
HEMATOCRIT: 29.1 % — AB (ref 34.8–46.6)
HGB: 9.6 g/dL — ABNORMAL LOW (ref 11.6–15.9)
LYMPH#: 0.9 10*3/uL (ref 0.9–3.3)
LYMPH%: 10.7 % — ABNORMAL LOW (ref 14.0–49.7)
MCH: 28.5 pg (ref 25.1–34.0)
MCHC: 32.9 g/dL (ref 31.5–36.0)
MCV: 86.5 fL (ref 79.5–101.0)
MONO#: 1.5 10*3/uL — AB (ref 0.1–0.9)
MONO%: 18.1 % — ABNORMAL HIGH (ref 0.0–14.0)
NEUT#: 5.8 10*3/uL (ref 1.5–6.5)
NEUT%: 70.7 % (ref 38.4–76.8)
Platelets: 209 10*3/uL (ref 145–400)
RBC: 3.36 10*6/uL — AB (ref 3.70–5.45)
RDW: 15.4 % — AB (ref 11.2–14.5)
WBC: 8.2 10*3/uL (ref 3.9–10.3)

## 2014-06-29 LAB — BASIC METABOLIC PANEL (CC13)
ANION GAP: 8 meq/L (ref 3–11)
BUN: 16.5 mg/dL (ref 7.0–26.0)
CO2: 22 mEq/L (ref 22–29)
Calcium: 9 mg/dL (ref 8.4–10.4)
Chloride: 108 mEq/L (ref 98–109)
Creatinine: 1.4 mg/dL — ABNORMAL HIGH (ref 0.6–1.1)
Glucose: 131 mg/dl (ref 70–140)
Sodium: 139 mEq/L (ref 136–145)

## 2014-06-29 LAB — MAGNESIUM (CC13): MAGNESIUM: 1.6 mg/dL (ref 1.5–2.5)

## 2014-06-29 MED ORDER — SODIUM POLYSTYRENE SULFONATE PO POWD: ORAL | Status: DC

## 2014-06-29 NOTE — Telephone Encounter (Signed)
gv and printed appt sched and avs for pt for Aug °

## 2014-06-29 NOTE — Progress Notes (Signed)
  [place] OFFICE PROGRESS NOTE   Diagnosis: AML  INTERVAL HISTORY:   Ms. Slonaker was discharged from the hospital 06/16/2014 after an admission with pancytopenia following high-dose cytarabine chemotherapy. She was diagnosed with C. difficile colitis and completed a course of Flagyl. She denies diarrhea. She feels well.  Objective:  Vital signs in last 24 hours:  Blood pressure 176/70, pulse 83, temperature 98.2 F (36.8 C), temperature source Oral, resp. rate 18, height 5\' 2"  (1.575 m), weight 145 lb 1.6 oz (65.817 kg), SpO2 100.00%.    HEENT: No thrush or ulcers Resp: Lungs clear bilaterally Cardio: Regular rate and rhythm GI: No hepatosplenomegaly Vascular: No leg edema   Portacath/PICC-without erythema  Lab Results:  Lab Results  Component Value Date   WBC 8.2 06/29/2014   HGB 9.6* 06/29/2014   HCT 29.1* 06/29/2014   MCV 86.5 06/29/2014   PLT 209 06/29/2014   NEUTROABS 5.8 06/29/2014   magnesium 1.6   Medications: I have reviewed the patient's current medications.  Assessment/Plan: 1. Acute myelogenous leukemia, monocytic differentiation diagnosed in March of 2015  Induction cytarabine/daunorubicin (7+3) followed by reinduction with cytarabine/daunorubicin and zinecard (5+2) with a recovering bone marrow 05/16/2014 consistent with remission   status post cycle 1 high-dose araC consolidation 05/30/2014  2. pancytopenia secondary to chemotherapy-leukopenia and thrombocytopenia have resolved, stable anemia 3. C. difficile colitis-06/09/2014 treated with Flagyl 4. Diabetes  5. renal insufficiency  6. history of hypokalemia and hypomagnesemia 7. history of atrial fibrillation  9. history of coronary artery disease    Disposition:  Ms. Bottari appears well. She is scheduled to be admitted for a second cycle of high-dose cytarabine chemotherapy 07/11/2014. She will return here for Neulasta and laboratory monitoring following chemotherapy. We will be  sure Dr. Florene Glen is aware of the hospital admission with severe pancytopenia following cycle 1 high-dose cytarabine. A dose reduction may be considered with cycle 2.  Ms. Diebel will be scheduled for an office visit approximately 2 weeks following the next cycle of chemotherapy.  Betsy Coder, MD  06/29/2014  12:51 PM

## 2014-06-29 NOTE — Telephone Encounter (Signed)
Per Dr. [name]; pt notified of high potassium and instructed to stop taking 20 meq KCL daily; script sent to pharmacy for Kayexalate 60 g to take today and re-check lab 06/30/14.  Pt verbalized understanding of all instructions and states "I'll go get it now" Confirmed appt for 7/24 @ 11:15.

## 2014-06-29 NOTE — Addendum Note (Signed)
Addended by: Domenic Schwab on: 06/29/2014 03:59 PM   Modules accepted: Orders

## 2014-06-29 NOTE — Telephone Encounter (Signed)
Spoke with Darliss Cheney, RN @ [facility] re: per Dr. Junious Silk fax dates for Neulasta and labs post next chemo; made her aware after 1st cycle pt admitted; severe pancytopenia; KCL level today in office 6.0; Kayexalate ordered, oral KCL stopped.  Darliss Cheney verbalized understanding and states at this time next cycle not scheduled but will fax new orders when available.

## 2014-06-30 ENCOUNTER — Other Ambulatory Visit: Payer: Self-pay | Admitting: Physician Assistant

## 2014-06-30 ENCOUNTER — Telehealth: Payer: Self-pay | Admitting: *Deleted

## 2014-06-30 ENCOUNTER — Other Ambulatory Visit: Payer: PRIVATE HEALTH INSURANCE

## 2014-06-30 DIAGNOSIS — E875 Hyperkalemia: Secondary | ICD-10-CM

## 2014-06-30 NOTE — Telephone Encounter (Signed)
NO NOTE

## 2014-06-30 NOTE — Telephone Encounter (Signed)
PT. DID NOT HAVE THE KAYEXALATE TO TAKE LAST NIGHT. HER PHARMACY ORDERED THE MEDICATION SO PT. COULD TAKE IT TODAY. CANCELLED PT. LAB WORK TODAY AT [facility] AND RESCHEDULED IT FOR TOMORROW AT [facility] [place]- WILL NOTIFIED THE PHYSICIAN ON CALL Saturday CONCERNING PT.'S LAB WORK.

## 2014-07-01 LAB — BASIC METABOLIC PANEL
BUN: 19 mg/dL (ref 6–23)
CALCIUM: 8.9 mg/dL (ref 8.4–10.5)
CO2: 23 meq/L (ref 19–32)
Chloride: 104 mEq/L (ref 96–112)
Creatinine, Ser: 1.44 mg/dL — ABNORMAL HIGH (ref 0.50–1.10)
Glucose, Bld: 191 mg/dL — ABNORMAL HIGH (ref 70–99)
POTASSIUM: 4.8 meq/L (ref 3.5–5.3)
SODIUM: 142 meq/L (ref 135–145)

## 2014-07-07 ENCOUNTER — Telehealth: Payer: Self-pay | Admitting: *Deleted

## 2014-07-07 NOTE — Telephone Encounter (Signed)
   Provider input needed: vaginal discharge and itching    Reason for call: vaginal discharge and itching   Genitourinary:positive for itching and vaginal discharge   ALLERGIES:  has No Known Allergies.  Patient last received chemotherapy/ treatment on dytarabine/daunorubin early July at [facility]  Patient was last seen in the office on 06/29/14   Next appt is Returns to [facility] on 07/11/14 for another treatment  Is patient having fevers greater than 100.5?  no   Is patient having uncontrolled pain, or new pain? no   Is patient having new back pain that changes with position (worsens or eases when laying down?)  no   Is patient able to eat and drink? yes    Is patient able to pass stool without difficulty?   yes     Is patient having uncontrolled nausea?  no    Summary Based on the above information advised patient to await return call after talking with NP.   Tania Ade  07/07/2014, 9:54 AM   Background Info  [name]   DOB: 1941-10-14   MR#: [id number]   CSN#   [id number] 07/07/2014

## 2014-07-07 NOTE — Telephone Encounter (Signed)
Per Ned Card, call Dr. Abel Presto staff at [facility] since she is due treatment next week and ensure they are aware of her C. Diff colitis she was in hospital for this month. Gaylyn Rong at [facility] triage nurse line. Reported symptoms and recent hospitalization. They will take care of it and call patient. Notified Corneisha of plan and she agrees.

## 2014-07-12 ENCOUNTER — Other Ambulatory Visit: Payer: Self-pay | Admitting: Oncology

## 2014-07-12 ENCOUNTER — Telehealth: Payer: Self-pay | Admitting: *Deleted

## 2014-07-12 ENCOUNTER — Other Ambulatory Visit: Payer: Self-pay | Admitting: *Deleted

## 2014-07-12 NOTE — Telephone Encounter (Signed)
Received call from [facility] to set up appt. For inj. Discuss with RN-Tonya about POF

## 2014-07-12 NOTE — Telephone Encounter (Signed)
Received fax from Dr. Abel Presto office. Pt will need Neulasta 8/11. CBC 8/13 then every M+TH. CMET and Mag every Mon. Transfuse for HGB less than 9 and PLT less than 20- per their protocol. Orders entered for lab/ injection appts.

## 2014-07-14 ENCOUNTER — Telehealth: Payer: Self-pay | Admitting: Dietician

## 2014-07-14 NOTE — Telephone Encounter (Signed)
Brief Outpatient Oncology Nutrition Note  Patient has been identified to be at risk on malnutrition screen.  Wt Readings from Last 10 Encounters:  06/29/14 145 lb 1.6 oz (65.817 kg)  06/08/14 153 lb (69.4 kg)  06/08/14 153 lb (69.4 kg)  05/24/14 153 lb (69.4 kg)  05/18/14 153 lb (69.4 kg)  03/02/14 182 lb 15.7 oz (83 kg)  02/23/14 177 lb (80.287 kg)  02/22/14 175 lb (79.379 kg)  02/09/14 180 lb (81.647 kg)  02/04/14 180 lb (81.647 kg)      Dx:  AML.  Patient of Dr. [name].  Called patient due to weight loss.  Patient has lost 35 lbs (19%) in the last 7 months.  Patient was seen by the Inpatient RD 05/24/14 and diagnosed with severe MALNUTRITION at that time.  Patient has continued to lose weight since discharge.  Patient denied problems or questions at this time.  Stated that she was eating well.  Does not use supplement.  [place] RD available as needed.  Antonieta Iba, RD, LDN

## 2014-07-15 ENCOUNTER — Telehealth: Payer: Self-pay | Admitting: Oncology

## 2014-07-15 NOTE — Telephone Encounter (Signed)
Labs added per 08/05 POF, advised to give pt an updated sch at next apt 08/11.Marland Kitchen..KJ

## 2014-07-17 ENCOUNTER — Other Ambulatory Visit: Payer: Self-pay | Admitting: *Deleted

## 2014-07-17 DIAGNOSIS — C9500 Acute leukemia of unspecified cell type not having achieved remission: Secondary | ICD-10-CM

## 2014-07-18 ENCOUNTER — Telehealth: Payer: Self-pay | Admitting: Oncology

## 2014-07-18 ENCOUNTER — Ambulatory Visit: Payer: Medicare HMO

## 2014-07-18 NOTE — Telephone Encounter (Signed)
[facility] @ [facility] called to cx appts for 8/12 - per Sharee Pimple still in [place] and they will call back to r/s lab when pt d/cd. message to desk nurse.

## 2014-07-19 ENCOUNTER — Ambulatory Visit: Payer: Medicare HMO

## 2014-07-19 ENCOUNTER — Other Ambulatory Visit: Payer: Medicare HMO

## 2014-07-20 ENCOUNTER — Other Ambulatory Visit: Payer: Medicare HMO

## 2014-07-25 ENCOUNTER — Ambulatory Visit: Payer: Medicare HMO | Admitting: Oncology

## 2014-07-25 ENCOUNTER — Other Ambulatory Visit: Payer: Medicare HMO

## 2014-07-26 ENCOUNTER — Other Ambulatory Visit: Payer: Self-pay | Admitting: *Deleted

## 2014-07-26 DIAGNOSIS — C9200 Acute myeloblastic leukemia, not having achieved remission: Secondary | ICD-10-CM

## 2014-07-26 DIAGNOSIS — C9500 Acute leukemia of unspecified cell type not having achieved remission: Secondary | ICD-10-CM

## 2014-07-27 ENCOUNTER — Other Ambulatory Visit: Payer: Medicare HMO

## 2014-07-27 ENCOUNTER — Telehealth: Payer: Self-pay | Admitting: Oncology

## 2014-07-27 NOTE — Telephone Encounter (Signed)
Nurse from [facility] cld to cancel apt due to pt was hospitalized will be heading home today.[name]..KJ

## 2014-07-28 ENCOUNTER — Other Ambulatory Visit: Payer: Self-pay | Admitting: *Deleted

## 2014-07-28 ENCOUNTER — Telehealth: Payer: Self-pay | Admitting: Oncology

## 2014-07-28 NOTE — Telephone Encounter (Signed)
lvm for pt regarding to all Aug appt

## 2014-07-31 ENCOUNTER — Encounter: Payer: Self-pay | Admitting: *Deleted

## 2014-07-31 ENCOUNTER — Telehealth: Payer: Self-pay | Admitting: *Deleted

## 2014-07-31 ENCOUNTER — Other Ambulatory Visit (HOSPITAL_BASED_OUTPATIENT_CLINIC_OR_DEPARTMENT_OTHER): Payer: Commercial Managed Care - HMO

## 2014-07-31 DIAGNOSIS — C9200 Acute myeloblastic leukemia, not having achieved remission: Secondary | ICD-10-CM

## 2014-07-31 DIAGNOSIS — D61818 Other pancytopenia: Secondary | ICD-10-CM

## 2014-07-31 DIAGNOSIS — C9500 Acute leukemia of unspecified cell type not having achieved remission: Secondary | ICD-10-CM

## 2014-07-31 LAB — COMPREHENSIVE METABOLIC PANEL (CC13)
ALK PHOS: 263 U/L — AB (ref 40–150)
ALT: <6 U/L (ref 0–55)
AST: 10 U/L (ref 5–34)
Albumin: 2.6 g/dL — ABNORMAL LOW (ref 3.5–5.0)
Anion Gap: 8 mEq/L (ref 3–11)
BUN: 14.3 mg/dL (ref 7.0–26.0)
CHLORIDE: 107 meq/L (ref 98–109)
CO2: 29 mEq/L (ref 22–29)
Calcium: 8.1 mg/dL — ABNORMAL LOW (ref 8.4–10.4)
Creatinine: 1 mg/dL (ref 0.6–1.1)
GLUCOSE: 162 mg/dL — AB (ref 70–140)
Potassium: 3.5 mEq/L (ref 3.5–5.1)
Sodium: 144 mEq/L (ref 136–145)
TOTAL PROTEIN: 5.6 g/dL — AB (ref 6.4–8.3)
Total Bilirubin: 0.38 mg/dL (ref 0.20–1.20)

## 2014-07-31 LAB — MAGNESIUM (CC13): Magnesium: 1.1 mg/dl — CL (ref 1.5–2.5)

## 2014-07-31 LAB — TECHNOLOGIST REVIEW

## 2014-07-31 LAB — CBC WITH DIFFERENTIAL/PLATELET
BASO%: 0.2 % (ref 0.0–2.0)
BASOS ABS: 0 10*3/uL (ref 0.0–0.1)
EOS%: 0.6 % (ref 0.0–7.0)
Eosinophils Absolute: 0 10*3/uL (ref 0.0–0.5)
HCT: 28.1 % — ABNORMAL LOW (ref 34.8–46.6)
HEMOGLOBIN: 9.4 g/dL — AB (ref 11.6–15.9)
LYMPH#: 0.7 10*3/uL — AB (ref 0.9–3.3)
LYMPH%: 17.5 % (ref 14.0–49.7)
MCH: 29.5 pg (ref 25.1–34.0)
MCHC: 33.5 g/dL (ref 31.5–36.0)
MCV: 88.1 fL (ref 79.5–101.0)
MONO#: 1.2 10*3/uL — ABNORMAL HIGH (ref 0.1–0.9)
MONO%: 30.7 % — ABNORMAL HIGH (ref 0.0–14.0)
NEUT#: 2 10*3/uL (ref 1.5–6.5)
NEUT%: 51 % (ref 38.4–76.8)
PLATELETS: 19 10*3/uL — AB (ref 145–400)
RBC: 3.19 10*6/uL — ABNORMAL LOW (ref 3.70–5.45)
RDW: 15 % — ABNORMAL HIGH (ref 11.2–14.5)
WBC: 3.9 10*3/uL (ref 3.9–10.3)

## 2014-07-31 NOTE — Telephone Encounter (Signed)
Per Dr. [name]; notified pt that platelets are 19 today and need to re-check tomorrow; also that Magnesium level is low and increase her Mag Oxide to 800 mg BID.  Pt verbalized understanding and confirmed lab appt 08/01/14 @ 10:45.  Faxed results to [facility].

## 2014-08-01 ENCOUNTER — Other Ambulatory Visit (HOSPITAL_BASED_OUTPATIENT_CLINIC_OR_DEPARTMENT_OTHER): Payer: Commercial Managed Care - HMO

## 2014-08-01 ENCOUNTER — Telehealth: Payer: Self-pay | Admitting: *Deleted

## 2014-08-01 DIAGNOSIS — C9200 Acute myeloblastic leukemia, not having achieved remission: Secondary | ICD-10-CM

## 2014-08-01 DIAGNOSIS — C9500 Acute leukemia of unspecified cell type not having achieved remission: Secondary | ICD-10-CM

## 2014-08-01 DIAGNOSIS — D61818 Other pancytopenia: Secondary | ICD-10-CM

## 2014-08-01 LAB — CBC WITH DIFFERENTIAL/PLATELET
BASO%: 0.1 % (ref 0.0–2.0)
BASOS ABS: 0 10*3/uL (ref 0.0–0.1)
EOS ABS: 0 10*3/uL (ref 0.0–0.5)
EOS%: 0.5 % (ref 0.0–7.0)
HEMATOCRIT: 25.9 % — AB (ref 34.8–46.6)
HEMOGLOBIN: 8.7 g/dL — AB (ref 11.6–15.9)
LYMPH%: 19.2 % (ref 14.0–49.7)
MCH: 29.4 pg (ref 25.1–34.0)
MCHC: 33.6 g/dL (ref 31.5–36.0)
MCV: 87.6 fL (ref 79.5–101.0)
MONO#: 1.1 10*3/uL — ABNORMAL HIGH (ref 0.1–0.9)
MONO%: 35.3 % — ABNORMAL HIGH (ref 0.0–14.0)
NEUT%: 44.9 % (ref 38.4–76.8)
NEUTROS ABS: 1.4 10*3/uL — AB (ref 1.5–6.5)
Platelets: 30 10*3/uL — ABNORMAL LOW (ref 145–400)
RBC: 2.95 10*6/uL — ABNORMAL LOW (ref 3.70–5.45)
RDW: 14.8 % — AB (ref 11.2–14.5)
WBC: 3.1 10*3/uL — ABNORMAL LOW (ref 3.9–10.3)
lymph#: 0.6 10*3/uL — ABNORMAL LOW (ref 0.9–3.3)

## 2014-08-01 NOTE — Telephone Encounter (Signed)
Per Dr. [name]; spoke to pt in lobby re: lab results; platelets up to 30, WBC 3.1; neutropenic precautions reviewed.  Pt verbalized understanding; copy of labs given to pt and confirmed lab appt Thursday 08/03/14.  Faxed lab results to [facility].

## 2014-08-03 ENCOUNTER — Other Ambulatory Visit: Payer: Self-pay | Admitting: *Deleted

## 2014-08-03 ENCOUNTER — Telehealth: Payer: Self-pay | Admitting: *Deleted

## 2014-08-03 ENCOUNTER — Other Ambulatory Visit (HOSPITAL_BASED_OUTPATIENT_CLINIC_OR_DEPARTMENT_OTHER): Payer: Commercial Managed Care - HMO

## 2014-08-03 DIAGNOSIS — D61818 Other pancytopenia: Secondary | ICD-10-CM

## 2014-08-03 DIAGNOSIS — C9200 Acute myeloblastic leukemia, not having achieved remission: Secondary | ICD-10-CM

## 2014-08-03 DIAGNOSIS — C9500 Acute leukemia of unspecified cell type not having achieved remission: Secondary | ICD-10-CM

## 2014-08-03 LAB — COMPREHENSIVE METABOLIC PANEL (CC13)
ALK PHOS: 244 U/L — AB (ref 40–150)
ALT: 8 U/L (ref 0–55)
AST: 14 U/L (ref 5–34)
Albumin: 2.4 g/dL — ABNORMAL LOW (ref 3.5–5.0)
Anion Gap: 8 mEq/L (ref 3–11)
BILIRUBIN TOTAL: 0.6 mg/dL (ref 0.20–1.20)
BUN: 16.9 mg/dL (ref 7.0–26.0)
CO2: 28 mEq/L (ref 22–29)
Calcium: 7.6 mg/dL — ABNORMAL LOW (ref 8.4–10.4)
Chloride: 106 mEq/L (ref 98–109)
Creatinine: 1.1 mg/dL (ref 0.6–1.1)
GLUCOSE: 198 mg/dL — AB (ref 70–140)
Potassium: 3.7 mEq/L (ref 3.5–5.1)
Sodium: 143 mEq/L (ref 136–145)
Total Protein: 5.6 g/dL — ABNORMAL LOW (ref 6.4–8.3)

## 2014-08-03 LAB — CBC WITH DIFFERENTIAL/PLATELET
BASO%: 0.2 % (ref 0.0–2.0)
Basophils Absolute: 0 10*3/uL (ref 0.0–0.1)
EOS ABS: 0 10*3/uL (ref 0.0–0.5)
EOS%: 0.1 % (ref 0.0–7.0)
HCT: 24.7 % — ABNORMAL LOW (ref 34.8–46.6)
HGB: 8.1 g/dL — ABNORMAL LOW (ref 11.6–15.9)
LYMPH%: 14.8 % (ref 14.0–49.7)
MCH: 29.3 pg (ref 25.1–34.0)
MCHC: 32.9 g/dL (ref 31.5–36.0)
MCV: 89.1 fL (ref 79.5–101.0)
MONO#: 1.2 10*3/uL — ABNORMAL HIGH (ref 0.1–0.9)
MONO%: 30.1 % — AB (ref 0.0–14.0)
NEUT%: 54.8 % (ref 38.4–76.8)
NEUTROS ABS: 2.2 10*3/uL (ref 1.5–6.5)
PLATELETS: 59 10*3/uL — AB (ref 145–400)
RBC: 2.77 10*6/uL — ABNORMAL LOW (ref 3.70–5.45)
RDW: 14.9 % — AB (ref 11.2–14.5)
WBC: 4 10*3/uL (ref 3.9–10.3)
lymph#: 0.6 10*3/uL — ABNORMAL LOW (ref 0.9–3.3)

## 2014-08-03 LAB — MAGNESIUM (CC13): MAGNESIUM: 1.1 mg/dL — AB (ref 1.5–2.5)

## 2014-08-03 NOTE — Telephone Encounter (Signed)
After MD review of labs, called [name] who reports some increase in fatigue, but no real dyspnea at this time. Gave her results-will try to transfuse tomorrow or Saturday-she understands and agrees. Lab results faxed to [facility]. Dr. [name] not making any changes in regards to magnesium-has already increased dose.

## 2014-08-03 NOTE — Telephone Encounter (Signed)
Per staff message and POF I have scheduled appts. Patient called and awae. JMW

## 2014-08-04 ENCOUNTER — Telehealth: Payer: Self-pay | Admitting: *Deleted

## 2014-08-04 ENCOUNTER — Other Ambulatory Visit: Payer: Self-pay | Admitting: *Deleted

## 2014-08-04 ENCOUNTER — Ambulatory Visit (HOSPITAL_BASED_OUTPATIENT_CLINIC_OR_DEPARTMENT_OTHER): Payer: Commercial Managed Care - HMO | Admitting: Nurse Practitioner

## 2014-08-04 ENCOUNTER — Ambulatory Visit (HOSPITAL_COMMUNITY)
Admission: RE | Admit: 2014-08-04 | Discharge: 2014-08-04 | Disposition: A | Discharge status: Home / Self Care | Payer: Medicare HMO | Source: Ambulatory Visit | Attending: Oncology | Admitting: Oncology

## 2014-08-04 ENCOUNTER — Ambulatory Visit (HOSPITAL_BASED_OUTPATIENT_CLINIC_OR_DEPARTMENT_OTHER): Payer: Commercial Managed Care - HMO

## 2014-08-04 ENCOUNTER — Other Ambulatory Visit (HOSPITAL_BASED_OUTPATIENT_CLINIC_OR_DEPARTMENT_OTHER): Payer: Commercial Managed Care - HMO

## 2014-08-04 VITALS — BP 165/58 | HR 99 | Temp 99.9°F | Resp 20

## 2014-08-04 DIAGNOSIS — C9200 Acute myeloblastic leukemia, not having achieved remission: Secondary | ICD-10-CM

## 2014-08-04 DIAGNOSIS — C9500 Acute leukemia of unspecified cell type not having achieved remission: Secondary | ICD-10-CM

## 2014-08-04 DIAGNOSIS — D61818 Other pancytopenia: Secondary | ICD-10-CM | POA: Diagnosis present

## 2014-08-04 DIAGNOSIS — D649 Anemia, unspecified: Secondary | ICD-10-CM

## 2014-08-04 DIAGNOSIS — R509 Fever, unspecified: Secondary | ICD-10-CM

## 2014-08-04 DIAGNOSIS — R197 Diarrhea, unspecified: Secondary | ICD-10-CM

## 2014-08-04 LAB — URINALYSIS, MICROSCOPIC - CHCC
BILIRUBIN (URINE): NEGATIVE
GLUCOSE UR CHCC: NEGATIVE mg/dL
KETONES: NEGATIVE mg/dL
Leukocyte Esterase: NEGATIVE
NITRITE: NEGATIVE
Protein: 300 mg/dL
Specific Gravity, Urine: 1.015 (ref 1.003–1.035)
Urobilinogen, UR: 0.2 mg/dL (ref 0.2–1)
pH: 8 (ref 4.6–8.0)

## 2014-08-04 LAB — CBC WITH DIFFERENTIAL/PLATELET
BASO%: 0 % (ref 0.0–2.0)
Basophils Absolute: 0 10*3/uL (ref 0.0–0.1)
EOS%: 0 % (ref 0.0–7.0)
Eosinophils Absolute: 0 10*3/uL (ref 0.0–0.5)
HEMATOCRIT: 25 % — AB (ref 34.8–46.6)
HGB: 8.3 g/dL — ABNORMAL LOW (ref 11.6–15.9)
LYMPH%: 13.7 % — AB (ref 14.0–49.7)
MCH: 29.4 pg (ref 25.1–34.0)
MCHC: 33.2 g/dL (ref 31.5–36.0)
MCV: 88.7 fL (ref 79.5–101.0)
MONO#: 1.3 10*3/uL — AB (ref 0.1–0.9)
MONO%: 26.3 % — ABNORMAL HIGH (ref 0.0–14.0)
NEUT#: 2.9 10*3/uL (ref 1.5–6.5)
NEUT%: 60 % (ref 38.4–76.8)
Platelets: 79 10*3/uL — ABNORMAL LOW (ref 145–400)
RBC: 2.82 10*6/uL — ABNORMAL LOW (ref 3.70–5.45)
RDW: 14.5 % (ref 11.2–14.5)
WBC: 4.8 10*3/uL (ref 3.9–10.3)
lymph#: 0.7 10*3/uL — ABNORMAL LOW (ref 0.9–3.3)

## 2014-08-04 LAB — PREPARE RBC (CROSSMATCH)

## 2014-08-04 LAB — HOLD TUBE, BLOOD BANK

## 2014-08-04 MED ORDER — SODIUM CHLORIDE 0.9 % IJ SOLN
10.0000 mL | INTRAMUSCULAR | Status: DC | PRN
Start: 2014-08-04 — End: 2014-08-04
  Administered 2014-08-04 (×2): 10 mL via INTRAVENOUS
  Filled 2014-08-04: qty 10

## 2014-08-04 MED ORDER — ACETAMINOPHEN 325 MG PO TABS
650.0000 mg | ORAL_TABLET | Freq: Once | ORAL | Status: AC
  Administered 2014-08-04: 650 mg via ORAL

## 2014-08-04 MED ORDER — DIPHENHYDRAMINE HCL 25 MG PO CAPS
ORAL_CAPSULE | ORAL | Status: AC
  Filled 2014-08-04: qty 1

## 2014-08-04 MED ORDER — SODIUM CHLORIDE 0.9 % IJ SOLN
10.0000 mL | INTRAMUSCULAR | Status: DC | PRN
  Filled 2014-08-04: qty 10

## 2014-08-04 MED ORDER — ACETAMINOPHEN 325 MG PO TABS
ORAL_TABLET | ORAL | Status: AC
  Filled 2014-08-04: qty 2

## 2014-08-04 MED ORDER — SODIUM CHLORIDE 0.9 % IV SOLN
Freq: Once | INTRAVENOUS | Status: AC
  Administered 2014-08-04: 13:00:00 via INTRAVENOUS

## 2014-08-04 MED ORDER — SODIUM CHLORIDE 0.9 % IV SOLN
250.0000 mL | Freq: Once | INTRAVENOUS | Status: AC
  Administered 2014-08-04: 250 mL via INTRAVENOUS

## 2014-08-04 MED ORDER — DIPHENHYDRAMINE HCL 25 MG PO CAPS
25.0000 mg | ORAL_CAPSULE | Freq: Once | ORAL | Status: AC
  Administered 2014-08-04: 25 mg via ORAL

## 2014-08-04 MED ORDER — SODIUM CHLORIDE 0.9 % IV SOLN
INTRAVENOUS | Status: AC
  Administered 2014-08-04: 13:00:00 via INTRAVENOUS
  Filled 2014-08-04: qty 1000

## 2014-08-04 MED ORDER — HEPARIN SOD (PORK) LOCK FLUSH 100 UNIT/ML IV SOLN
500.0000 [IU] | Freq: Once | INTRAVENOUS | Status: AC
  Administered 2014-08-04: 500 [IU] via INTRAVENOUS
  Filled 2014-08-04: qty 5

## 2014-08-04 NOTE — Progress Notes (Signed)
1345-Pt with a temp of 100.0.  Tylenol 650 mg PO given at 1315 pre med to PRBC's.  Slight shaking and chills noted to patient.  Pt denies any other complaints at this time.  Michel Harrow NP notified and will be in to see pt in infusion room.  Order received to proceed with blood transfusion at this time and to send urine for u/a and culture.  1600-Order received to give pt additional Tylenol 650 mg PO at 1715 and to send stool for c-diff sample. Pt has had no loose stools today.  Per pt, she had 2 small loose stools yesterday.   1710-Temp-100.2.  Pt instructed to monitor temperature over the weekend and to notify MD on call for temp 100.5 or greater.  Pt does have thermometer and Tylenol at home.

## 2014-08-04 NOTE — Progress Notes (Signed)
Selena Lesser NP in to see pt. From [facility] to assess pt's increase temp.   Repeat Tylenol in 4hrs since last given dose. Continue to monitor.  Cyndee will return to assess pt. Before discharge. Hl

## 2014-08-04 NOTE — Patient Instructions (Signed)

## 2014-08-04 NOTE — Telephone Encounter (Signed)
Received call from [facility] @ Dr. Theodora Blow office stating that she had received pt's  faxed lab results done yesterday.   Pt would need to be set up for blood transfusion and magnesium infusion as per their parameters.  Asked Maudie Mercury to fax the order to our office.  Message to Norville Haggard, desk nurse.

## 2014-08-04 NOTE — Addendum Note (Signed)
Addended by: Domenic Schwab on: 08/04/2014 10:56 AM   Modules accepted: Orders

## 2014-08-05 LAB — URINE CULTURE

## 2014-08-05 LAB — TYPE AND SCREEN
ABO/RH(D): O NEG
ANTIBODY SCREEN: NEGATIVE
UNIT DIVISION: 0
Unit division: 0

## 2014-08-07 ENCOUNTER — Ambulatory Visit (HOSPITAL_BASED_OUTPATIENT_CLINIC_OR_DEPARTMENT_OTHER): Payer: Commercial Managed Care - HMO | Admitting: Nurse Practitioner

## 2014-08-07 ENCOUNTER — Telehealth: Payer: Self-pay | Admitting: Oncology

## 2014-08-07 ENCOUNTER — Other Ambulatory Visit (HOSPITAL_BASED_OUTPATIENT_CLINIC_OR_DEPARTMENT_OTHER): Payer: Commercial Managed Care - HMO

## 2014-08-07 ENCOUNTER — Other Ambulatory Visit: Payer: Medicare HMO

## 2014-08-07 VITALS — BP 148/55 | HR 78 | Temp 98.5°F | Resp 18 | Ht 62.0 in | Wt 153.1 lb

## 2014-08-07 DIAGNOSIS — D61810 Antineoplastic chemotherapy induced pancytopenia: Secondary | ICD-10-CM

## 2014-08-07 DIAGNOSIS — E876 Hypokalemia: Secondary | ICD-10-CM

## 2014-08-07 DIAGNOSIS — C9200 Acute myeloblastic leukemia, not having achieved remission: Secondary | ICD-10-CM

## 2014-08-07 DIAGNOSIS — N289 Disorder of kidney and ureter, unspecified: Secondary | ICD-10-CM

## 2014-08-07 DIAGNOSIS — C9500 Acute leukemia of unspecified cell type not having achieved remission: Secondary | ICD-10-CM

## 2014-08-07 DIAGNOSIS — T451X5A Adverse effect of antineoplastic and immunosuppressive drugs, initial encounter: Secondary | ICD-10-CM

## 2014-08-07 LAB — CBC WITH DIFFERENTIAL/PLATELET
BASO%: 0.3 % (ref 0.0–2.0)
BASOS ABS: 0 10*3/uL (ref 0.0–0.1)
EOS%: 0.1 % (ref 0.0–7.0)
Eosinophils Absolute: 0 10*3/uL (ref 0.0–0.5)
HCT: 31 % — ABNORMAL LOW (ref 34.8–46.6)
HGB: 10.2 g/dL — ABNORMAL LOW (ref 11.6–15.9)
LYMPH#: 0.5 10*3/uL — AB (ref 0.9–3.3)
LYMPH%: 11.9 % — ABNORMAL LOW (ref 14.0–49.7)
MCH: 29.5 pg (ref 25.1–34.0)
MCHC: 33 g/dL (ref 31.5–36.0)
MCV: 89.3 fL (ref 79.5–101.0)
MONO#: 0.6 10*3/uL (ref 0.1–0.9)
MONO%: 13.9 % (ref 0.0–14.0)
NEUT#: 3.2 10*3/uL (ref 1.5–6.5)
NEUT%: 73.8 % (ref 38.4–76.8)
PLATELETS: 129 10*3/uL — AB (ref 145–400)
RBC: 3.47 10*6/uL — ABNORMAL LOW (ref 3.70–5.45)
RDW: 15.2 % — ABNORMAL HIGH (ref 11.2–14.5)
WBC: 4.3 10*3/uL (ref 3.9–10.3)

## 2014-08-07 LAB — COMPREHENSIVE METABOLIC PANEL (CC13)
ALBUMIN: 2.4 g/dL — AB (ref 3.5–5.0)
ALT: 12 U/L (ref 0–55)
ANION GAP: 8 meq/L (ref 3–11)
AST: 15 U/L (ref 5–34)
Alkaline Phosphatase: 221 U/L — ABNORMAL HIGH (ref 40–150)
BUN: 11.5 mg/dL (ref 7.0–26.0)
CALCIUM: 8.1 mg/dL — AB (ref 8.4–10.4)
CHLORIDE: 112 meq/L — AB (ref 98–109)
CO2: 24 meq/L (ref 22–29)
CREATININE: 1 mg/dL (ref 0.6–1.1)
Glucose: 210 mg/dl — ABNORMAL HIGH (ref 70–140)
POTASSIUM: 3.5 meq/L (ref 3.5–5.1)
Sodium: 144 mEq/L (ref 136–145)
Total Bilirubin: 0.61 mg/dL (ref 0.20–1.20)
Total Protein: 5.8 g/dL — ABNORMAL LOW (ref 6.4–8.3)

## 2014-08-07 LAB — MAGNESIUM (CC13): Magnesium: 1.5 mg/dl (ref 1.5–2.5)

## 2014-08-07 MED ORDER — MAGNESIUM OXIDE 400 (241.3 MG) MG PO TABS: 800.0000 mg | ORAL_TABLET | Freq: Two times a day (BID) | ORAL | Status: DC

## 2014-08-07 NOTE — Telephone Encounter (Signed)
gv and printed appt sched and avs for pt for SEpt. °

## 2014-08-07 NOTE — Progress Notes (Signed)
  [place] OFFICE PROGRESS NOTE   Diagnosis:  AML.  INTERVAL HISTORY:   [name] was admitted on 07/11/2014 for cycle 2 high-dose cytarabine chemotherapy. Course was complicated by diarrhea, C. difficile negative, as well as fever and possible pneumonia. She was discharged on 07/28/2014.  She received a blood transfusion and IV magnesium on 08/04/2014.  She denies fever. No shaking chills. She denies bleeding. She has occasional loose stools. No significant diarrhea. No nausea or vomiting. No mouth sores. She denies pain. No shortness of breath. No hematuria or dysuria.     Objective:  Vital signs in last 24 hours:  Blood pressure 148/55, pulse 78, temperature 98.5 F (36.9 C), temperature source Oral, resp. rate 18, height 5\' 2"  (1.575 m), weight 153 lb 1.6 oz (69.446 kg), SpO2 99.00%.    HEENT: No thrush or ulcers. Lymphatics: No palpable cervical or supraclavicular lymph nodes. Resp: Lungs clear bilaterally. Cardio: Regular rate and rhythm. GI: Abdomen soft and nontender. No organomegaly. Vascular: Trace to 1+ pitting edema at the lower legs bilaterally. Calves soft and nontender.  Port-A-Cath site without erythema.  Lab Results:  Lab Results  Component Value Date   WBC 4.3 08/07/2014   HGB 10.2* 08/07/2014   HCT 31.0* 08/07/2014   MCV 89.3 08/07/2014   PLT 129* 08/07/2014   NEUTROABS 3.2 08/07/2014    Imaging:  No results found.  Medications: I have reviewed the patient's current medications.  Assessment/Plan: 1. Acute myelogenous leukemia, monocytic differentiation diagnosed in March of 2015  Induction cytarabine/daunorubicin (7+3) followed by reinduction with cytarabine/daunorubicin and zinecard (5+2) with a recovering bone marrow 05/16/2014 consistent with remission  status post cycle 1 high-dose araC consolidation 05/30/2014  Status post cycle 2 high-dose araC consolidation 07/11/2014. 2. pancytopenia secondary to chemotherapy.  Improved. 3. C. difficile colitis-06/09/2014 treated with Flagyl  4. Diabetes  5. renal insufficiency  6. history of hypokalemia and hypomagnesemia. She continues potassium and magnesium supplements.  7. history of atrial fibrillation  9. history of coronary artery disease     Disposition: [name] appears stable. She is scheduled to be admitted for a third cycle of high-dose cytarabine chemotherapy on 08/22/2014. We scheduled a followup visit here in one month.  She will contact the office prior to her next visit with any problems.  Plan reviewed with Dr. [name].      Ned Card ANP/GNP-BC   08/07/2014  10:27 AM

## 2014-08-08 ENCOUNTER — Other Ambulatory Visit: Payer: Self-pay | Admitting: *Deleted

## 2014-08-08 MED ORDER — POTASSIUM CHLORIDE CRYS ER 20 MEQ PO TBCR: 20.0000 meq | EXTENDED_RELEASE_TABLET | Freq: Every day | ORAL | Status: DC

## 2014-08-09 DIAGNOSIS — D61818 Other pancytopenia: Secondary | ICD-10-CM | POA: Insufficient documentation

## 2014-08-09 DIAGNOSIS — R197 Diarrhea, unspecified: Secondary | ICD-10-CM | POA: Insufficient documentation

## 2014-08-09 DIAGNOSIS — R509 Fever, unspecified: Secondary | ICD-10-CM | POA: Insufficient documentation

## 2014-08-09 NOTE — Assessment & Plan Note (Signed)
Patient's hemoglobin was 8.3 yesterday.  The patient will receive 2 units packed red blood cells today.  Also, patients up the count has slightly improved from 59 up to 79.  Patient denies any new issues with either easy bleeding or bruising.

## 2014-08-09 NOTE — Progress Notes (Signed)
[name]   Chief Complaint  Patient presents with  . Fever    HPI: [name] 73 y.o. female diagnosed with acute myelogenous leukemia.  Patient has completed 2 cycles of high-dose cytarabine chemotherapy as an admitted patient.  As she is scheduled to be admitted and received cycle 3 of the same chemotherapy regimen on 08/22/2014.  Patient had labs drawn just yesterday; and it was noted that her hemoglobin remained low at 8.3.  Also, her magnesium was 1.1.  The patient presented today to receive 2 units packed red blood cells; and to receive a magnesium infusion.  Patient states that she has also been taking oral magnesium at home as well.  Patient states she has noticed some intermittent loose stools secondary to increase in her oral magnesium.  It was noted the patient presented to the [facility] today with a temperature of 99.2.  The patient's temperature did increase to maximum of 100.2 prior to receiving any blood transfusion products.  Did confirm the patient did receive both Tylenol and Benadryl as premedications prior to any blood product transfusions today.  Also, patient was given a second dose of Tylenol approximate 4-5 hours after receiving first dose of the initial Tylenol.    Patient denies any new symptoms whatsoever.  Patient does have a history of C. difficile in the past; but states that she has had no abdominal pain/cramping within the past few days.  Chills denies any coughing or dysuria as well.    HPI   ROS  Past Medical History  Diagnosis Date  . DIABETES MELLITUS, TYPE II 07/13/2007  . HYPERLIPIDEMIA 12/24/2007  . HYPERTENSION 07/13/2007  . CORONARY ARTERY DISEASE 12/24/2007  . NEPHROLITHIASIS, HX OF 12/24/2007  . Colon polyps 04/29/2010    TUBULAR ADENOMA AND A SERRATED ADENOMA  . Acute pancreatitis   . Unspecified disease of pancreas   . Chronic kidney disease, stage II (mild)     Past Surgical History  Procedure Laterality Date  .  Excisional hemorrhoidectomy  unknown  . Abdominal hysterectomy  unknown  . Nasal sinus surgery  unknown  . Cardiac catheterization  2002  . Cataract extraction Bilateral     has DIABETES MELLITUS, TYPE II; HYPERLIPIDEMIA; HYPERTENSION; CAD, non obstructive in '02; HIP PAIN, LEFT; EDEMA- LOCALIZED; CHEST PAIN, ATYPICAL; OTHER NONSPECIFIC FINDING EXAMINATION OF URINE; NEPHROLITHIASIS, HX OF; Pancreatitis; CKD (chronic kidney disease), stage III; Lesion of pancreas; Nonspecific (abnormal) findings on radiological and other examination of gastrointestinal tract; Abdominal pain, epigastric; Cirrhosis of liver without mention of alcohol; Chest pain; PAF- flutter; Back pain; Sepsis; Atrial fibrillation with RVR; Cervical spine pain; Alkaline phosphatase elevation; Leukopenia; Thrombocytopenia; Anemia; Abnormal MRI; Acute leukemia; TIA (transient ischemic attack); Hypoglycemia; CKD (chronic kidney disease) stage 3, GFR 30-59 ml/min; AML (acute myeloblastic leukemia); Leukemia, acute; Other pancytopenia; Fever; Hypomagnesemia; and Diarrhea on her problem list.     has No Known Allergies.    Medication List       This list is accurate as of: 08/04/14 11:59 PM.  Always use your most recent med list.               acetaminophen 325 MG tablet  Commonly known as:  TYLENOL  Take 2 tablets (650 mg total) by mouth every 6 (six) hours as needed for mild pain (or Fever >/= 101).     acyclovir 400 MG tablet  Commonly known as:  ZOVIRAX  Take 1 tablet (400 mg total) by mouth daily.  aminocaproic acid 500 MG tablet  Commonly known as:  AMICAR  Take 2 tablets (1,000 mg total) by mouth every 8 (eight) hours.     amLODipine 5 MG tablet  Commonly known as:  NORVASC  Take 5 mg by mouth daily.     LORazepam 0.5 MG tablet  Commonly known as:  ATIVAN  Take 1 tablet (0.5 mg total) by mouth every 6 (six) hours as needed for anxiety.     magnesium oxide 400 (241.3 MG) MG tablet  Commonly known as:  MAG-OX   Take 800 mg by mouth 2 (two) times daily.     metoprolol 50 MG tablet  Commonly known as:  LOPRESSOR  Take 50 mg by mouth 2 (two) times daily.     nitroGLYCERIN 0.4 MG SL tablet  Commonly known as:  NITROSTAT  Place 1 tablet (0.4 mg total) under the tongue every 5 (five) minutes as needed for chest pain.     oxyCODONE-acetaminophen 7.5-325 MG per tablet  Commonly known as:  PERCOCET  Take 1 tablet by mouth every 4 (four) hours as needed for pain.     pantoprazole 40 MG tablet  Commonly known as:  PROTONIX  Take 1 tablet (40 mg total) by mouth daily.     polyethylene glycol packet  Commonly known as:  MIRALAX / GLYCOLAX  Take 17 g by mouth daily as needed for moderate constipation.     potassium chloride SA 20 MEQ tablet  Commonly known as:  K-DUR,KLOR-CON  Take 1 tablet (20 mEq total) by mouth daily.     promethazine 12.5 MG tablet  Commonly known as:  PHENERGAN  Take 2 tablets (25 mg total) by mouth every 6 (six) hours as needed for nausea.     simvastatin 20 MG tablet  Commonly known as:  ZOCOR  Take 20 mg by mouth every evening.     sodium polystyrene powder  Commonly known as:  KAYEXALATE  Take 60 grams now--Today only 06/29/14         PHYSICAL EXAMINATION Vitals: BP 150/53, HR 93, temp 99.8, o2 sat 99%  Physical Exam  LABORATORY DATA:. CBC  Lab Results  Component Value Date   WBC 4.3 08/07/2014   RBC 3.47* 08/07/2014   HGB 10.2* 08/07/2014   HCT 31.0* 08/07/2014   PLT 129* 08/07/2014   MCV 89.3 08/07/2014   MCH 29.5 08/07/2014   MCHC 33.0 08/07/2014   RDW 15.2* 08/07/2014   LYMPHSABS 0.5* 08/07/2014   MONOABS 0.6 08/07/2014   EOSABS 0.0 08/07/2014   BASOSABS 0.0 08/07/2014     CMET  Lab Results  Component Value Date   NA 144 08/07/2014   K 3.5 08/07/2014   CL 104 06/30/2014   CO2 24 08/07/2014   GLUCOSE 210* 08/07/2014   BUN 11.5 08/07/2014   CREATININE 1.0 08/07/2014   CALCIUM 8.1* 08/07/2014   PROT 5.8* 08/07/2014   ALBUMIN 2.4* 08/07/2014   AST 15  08/07/2014   ALT 12 08/07/2014   ALKPHOS 221* 08/07/2014   BILITOT 0.61 08/07/2014   GFRNONAA 41* 06/16/2014   GFRAA 48* 06/16/2014     ASSESSMENT/PLAN:    AML (acute myeloblastic leukemia)  Assessment & Plan Patient was admitted and received high dose cytarabine on 07/11/2014.  I she is scheduled to receive cycle 3 of the same chemotherapy regimen as an admitted patient on 08/22/2014.   Other pancytopenia  Assessment & Plan Patient's hemoglobin was 8.3 yesterday.  The patient will receive 2 units packed  red blood cells today.  Also, patients up the count has slightly improved from 59 up to 79.  Patient denies any new issues with either easy bleeding or bruising.   Fever  Assessment & Plan Patient was noted to have a temperature of 99.0 to max of 100.2 prior to receiving any blood transfusion products.  Patient denies any known fever or chills within the past 24-48 hours.  Patient denies any new cough or dysuria.  Urinalysis obtained today was negative for UTI.  Since patient presented with low grade temperature prior to receiving a blood transfusion-low grade fever  cannot realistically be associated with possible blood transfusion reaction.  Patient was premedicated with both Tylenol and.  She also received a second dose of Tylenol Pepcid 45 hours after arriving at the [facility]. Patient proceeded with transfusion of 2 units of packed red blood cells at this afternoon cancer Center with the no other symptoms whatsoever.  Temperature prior to discharge from the [facility] infusion area was 99.2.  Patient was advised to call or go directly to the emergency department if she continues with fever or any worsening symptoms whatsoever.   Hypomagnesemia  Assessment & Plan Magnesium level from labs drawn yesterday was 1.1.  The patient did receive a magnesium infusion today in the cancer Center infusion area as well.  Patient also states that she has been taking exam orally at home.  She  states that this has caused some mild diarrhea; but denies any specific abdominal discomfort whatsoever.   Diarrhea  Assessment & Plan Patient has been taking oral magnesium at home due to low magnesium levels.  She also received a magnesium intravenously today while in the infusion area.  She states that she has developed some intermittent loose stools secondary to the oral magnesium.  I she does have a history of C. difficile in the past; but denies any abdominal discomfort or cramping.  She states that she only had 2 small loose stools yesterday.  Will need to consider obtaining a stool sample to rule out C. difficile is fevers or new symptoms present.   Patient stated understanding of all instructions; and was in agreement with this plan of care. The patient knows to call the clinic with any problems, questions or concerns.   Review/collaboration with Dr. [name]  regarding all aspects of patient's visit today.   Total time spent with patient was  40 minutes;  with greater than 80 percent of that time spent in face to face counseling regarding  her symptoms, frequent monitoring of patient,  and coordination of care and follow up.  Disclaimer: This note was dictated with voice recognition software. Similar sounding words can inadvertently be transcribed and may not be corrected upon review.   Drue Second, NP 08/09/2014

## 2014-08-09 NOTE — Assessment & Plan Note (Signed)
Patient was noted to have a temperature of 99.0 to max of 100.2 prior to receiving any blood transfusion products.  Patient denies any known fever or chills within the past 24-48 hours.  Patient denies any new cough or dysuria.  Urinalysis obtained today was negative for UTI.  Since patient presented with low grade temperature prior to receiving a blood transfusion-low grade fever  cannot realistically be associated with possible blood transfusion reaction.  Patient was premedicated with both Tylenol and.  She also received a second dose of Tylenol Pepcid 45 hours after arriving at the [facility]. Patient proceeded with transfusion of 2 units of packed red blood cells at this afternoon cancer Center with the no other symptoms whatsoever.  Temperature prior to discharge from the [facility] infusion area was 99.2.  Patient was advised to call or go directly to the emergency department if she continues with fever or any worsening symptoms whatsoever.

## 2014-08-09 NOTE — Assessment & Plan Note (Signed)
Magnesium level from labs drawn yesterday was 1.1.  The patient did receive a magnesium infusion today in the cancer Center infusion area as well.  Patient also states that she has been taking exam orally at home.  She states that this has caused some mild diarrhea; but denies any specific abdominal discomfort whatsoever.

## 2014-08-09 NOTE — Assessment & Plan Note (Signed)
Patient has been taking oral magnesium at home due to low magnesium levels.  She also received a magnesium intravenously today while in the infusion area.  She states that she has developed some intermittent loose stools secondary to the oral magnesium.  I she does have a history of C. difficile in the past; but denies any abdominal discomfort or cramping.  She states that she only had 2 small loose stools yesterday.  Will need to consider obtaining a stool sample to rule out C. difficile is fevers or new symptoms present.

## 2014-08-09 NOTE — Assessment & Plan Note (Signed)
Patient was admitted and received high dose cytarabine on 07/11/2014.  I she is scheduled to receive cycle 3 of the same chemotherapy regimen as an admitted patient on 08/22/2014.

## 2014-08-21 ENCOUNTER — Ambulatory Visit: Payer: PRIVATE HEALTH INSURANCE | Admitting: *Deleted

## 2014-08-21 VITALS — BP 132/54

## 2014-08-21 DIAGNOSIS — I10 Essential (primary) hypertension: Secondary | ICD-10-CM

## 2014-08-21 NOTE — Progress Notes (Signed)
Pt comes in today for a nurse visit BP check for the diabetic bundle.  BP was 132/54. Pt was metoprolol 50 mg and amlodipine 5 mg.  Told pt to continue current medications

## 2014-08-24 ENCOUNTER — Other Ambulatory Visit: Payer: Self-pay | Admitting: *Deleted

## 2014-08-24 ENCOUNTER — Telehealth: Payer: Self-pay | Admitting: Oncology

## 2014-08-24 DIAGNOSIS — C9500 Acute leukemia of unspecified cell type not having achieved remission: Secondary | ICD-10-CM

## 2014-08-24 NOTE — Telephone Encounter (Signed)
Spk w/pt's husband Gwyndolyn Saxon, pt is still in the hospital but if she gets to come home this wknd pt will come to 09/21 if not pt's husband will call us back cancel. KJ

## 2014-08-28 ENCOUNTER — Ambulatory Visit (HOSPITAL_BASED_OUTPATIENT_CLINIC_OR_DEPARTMENT_OTHER): Payer: Commercial Managed Care - HMO

## 2014-08-28 ENCOUNTER — Other Ambulatory Visit (HOSPITAL_BASED_OUTPATIENT_CLINIC_OR_DEPARTMENT_OTHER): Payer: Commercial Managed Care - HMO

## 2014-08-28 VITALS — BP 148/56 | HR 75 | Temp 98.3°F

## 2014-08-28 DIAGNOSIS — C9200 Acute myeloblastic leukemia, not having achieved remission: Secondary | ICD-10-CM

## 2014-08-28 DIAGNOSIS — Z5189 Encounter for other specified aftercare: Secondary | ICD-10-CM

## 2014-08-28 DIAGNOSIS — C9500 Acute leukemia of unspecified cell type not having achieved remission: Secondary | ICD-10-CM

## 2014-08-28 LAB — COMPREHENSIVE METABOLIC PANEL (CC13)
ALBUMIN: 3 g/dL — AB (ref 3.5–5.0)
ALK PHOS: 232 U/L — AB (ref 40–150)
ALT: 34 U/L (ref 0–55)
AST: 47 U/L — ABNORMAL HIGH (ref 5–34)
Anion Gap: 12 mEq/L — ABNORMAL HIGH (ref 3–11)
BILIRUBIN TOTAL: 0.69 mg/dL (ref 0.20–1.20)
BUN: 19.1 mg/dL (ref 7.0–26.0)
CO2: 21 mEq/L — ABNORMAL LOW (ref 22–29)
Calcium: 8.3 mg/dL — ABNORMAL LOW (ref 8.4–10.4)
Chloride: 112 mEq/L — ABNORMAL HIGH (ref 98–109)
Creatinine: 1.3 mg/dL — ABNORMAL HIGH (ref 0.6–1.1)
Glucose: 181 mg/dl — ABNORMAL HIGH (ref 70–140)
POTASSIUM: 3.6 meq/L (ref 3.5–5.1)
SODIUM: 145 meq/L (ref 136–145)
TOTAL PROTEIN: 6 g/dL — AB (ref 6.4–8.3)

## 2014-08-28 LAB — CBC WITH DIFFERENTIAL/PLATELET
BASO%: 0.3 % (ref 0.0–2.0)
Basophils Absolute: 0 10*3/uL (ref 0.0–0.1)
EOS ABS: 0 10*3/uL (ref 0.0–0.5)
EOS%: 0.7 % (ref 0.0–7.0)
HCT: 32.4 % — ABNORMAL LOW (ref 34.8–46.6)
HGB: 10.6 g/dL — ABNORMAL LOW (ref 11.6–15.9)
LYMPH%: 4.9 % — AB (ref 14.0–49.7)
MCH: 29.4 pg (ref 25.1–34.0)
MCHC: 32.6 g/dL (ref 31.5–36.0)
MCV: 90.2 fL (ref 79.5–101.0)
MONO#: 0 10*3/uL — ABNORMAL LOW (ref 0.1–0.9)
MONO%: 0.2 % (ref 0.0–14.0)
NEUT%: 93.9 % — ABNORMAL HIGH (ref 38.4–76.8)
NEUTROS ABS: 5.6 10*3/uL (ref 1.5–6.5)
PLATELETS: 92 10*3/uL — AB (ref 145–400)
RBC: 3.59 10*6/uL — AB (ref 3.70–5.45)
RDW: 17 % — AB (ref 11.2–14.5)
WBC: 6 10*3/uL (ref 3.9–10.3)
lymph#: 0.3 10*3/uL — ABNORMAL LOW (ref 0.9–3.3)

## 2014-08-28 LAB — MAGNESIUM (CC13): Magnesium: 1.7 mg/dl (ref 1.5–2.5)

## 2014-08-28 MED ORDER — PEGFILGRASTIM INJECTION 6 MG/0.6ML
6.0000 mg | Freq: Once | SUBCUTANEOUS | Status: AC
Start: 2014-08-28 — End: 2014-08-28
  Administered 2014-08-28: 6 mg via SUBCUTANEOUS
  Filled 2014-08-28: qty 0.6

## 2014-08-29 ENCOUNTER — Telehealth: Payer: Self-pay | Admitting: *Deleted

## 2014-08-29 NOTE — Telephone Encounter (Signed)
Faxed lab results to [facility] @ [phone]

## 2014-08-29 NOTE — Telephone Encounter (Signed)
Call from pt requesting Neulasta authorization to be re-filed. She received a call from [facility] stating it was filed incorrectly therefore denied. Informed pt that managed care dept is working on this. Spoke with Benjamine Mola, she will clarify with [employer].

## 2014-08-31 ENCOUNTER — Other Ambulatory Visit (HOSPITAL_BASED_OUTPATIENT_CLINIC_OR_DEPARTMENT_OTHER): Payer: Commercial Managed Care - HMO

## 2014-08-31 ENCOUNTER — Telehealth: Payer: Self-pay | Admitting: *Deleted

## 2014-08-31 ENCOUNTER — Ambulatory Visit (HOSPITAL_BASED_OUTPATIENT_CLINIC_OR_DEPARTMENT_OTHER): Payer: Commercial Managed Care - HMO

## 2014-08-31 ENCOUNTER — Other Ambulatory Visit: Payer: Self-pay | Admitting: *Deleted

## 2014-08-31 ENCOUNTER — Ambulatory Visit (HOSPITAL_COMMUNITY)
Admission: RE | Admit: 2014-08-31 | Discharge: 2014-08-31 | Disposition: A | Discharge status: Home / Self Care | Payer: Medicare HMO | Source: Ambulatory Visit | Attending: Oncology | Admitting: Oncology

## 2014-08-31 VITALS — BP 145/82 | HR 95 | Temp 98.5°F | Resp 20

## 2014-08-31 DIAGNOSIS — C9500 Acute leukemia of unspecified cell type not having achieved remission: Secondary | ICD-10-CM

## 2014-08-31 DIAGNOSIS — C9200 Acute myeloblastic leukemia, not having achieved remission: Secondary | ICD-10-CM | POA: Diagnosis present

## 2014-08-31 LAB — CBC WITH DIFFERENTIAL/PLATELET
BASO%: 0 % (ref 0.0–2.0)
BASOS ABS: 0 10*3/uL (ref 0.0–0.1)
EOS%: 1 % (ref 0.0–7.0)
Eosinophils Absolute: 0 10*3/uL (ref 0.0–0.5)
HEMATOCRIT: 27 % — AB (ref 34.8–46.6)
HEMOGLOBIN: 9 g/dL — AB (ref 11.6–15.9)
LYMPH#: 0.3 10*3/uL — AB (ref 0.9–3.3)
LYMPH%: 27.9 % (ref 14.0–49.7)
MCH: 29.5 pg (ref 25.1–34.0)
MCHC: 33.3 g/dL (ref 31.5–36.0)
MCV: 88.5 fL (ref 79.5–101.0)
MONO#: 0 10*3/uL — ABNORMAL LOW (ref 0.1–0.9)
MONO%: 0 % (ref 0.0–14.0)
NEUT#: 0.7 10*3/uL — ABNORMAL LOW (ref 1.5–6.5)
NEUT%: 71.1 % (ref 38.4–76.8)
Platelets: 15 10*3/uL — ABNORMAL LOW (ref 145–400)
RBC: 3.05 10*6/uL — ABNORMAL LOW (ref 3.70–5.45)
RDW: 15.2 % — ABNORMAL HIGH (ref 11.2–14.5)
WBC: 1 10*3/uL — ABNORMAL LOW (ref 3.9–10.3)

## 2014-08-31 LAB — SAMPLE TO BLOOD BANK

## 2014-08-31 LAB — HOLD TUBE, BLOOD BANK

## 2014-08-31 MED ORDER — HEPARIN SOD (PORK) LOCK FLUSH 100 UNIT/ML IV SOLN
500.0000 [IU] | Freq: Every day | INTRAVENOUS | Status: AC | PRN
  Administered 2014-08-31: 500 [IU]
  Filled 2014-08-31: qty 5

## 2014-08-31 MED ORDER — ACETAMINOPHEN 325 MG PO TABS: 650.0000 mg | ORAL_TABLET | Freq: Once | ORAL | Status: DC

## 2014-08-31 MED ORDER — SODIUM CHLORIDE 0.9 % IV SOLN: 250.0000 mL | Freq: Once | INTRAVENOUS | Status: DC

## 2014-08-31 MED ORDER — NITROGLYCERIN 0.4 MG SL SUBL
0.4000 mg | SUBLINGUAL_TABLET | SUBLINGUAL | Status: DC | PRN
  Administered 2014-08-31: 0.4 mg via SUBLINGUAL

## 2014-08-31 MED ORDER — DIPHENHYDRAMINE HCL 25 MG PO CAPS
ORAL_CAPSULE | ORAL | Status: AC
  Filled 2014-08-31: qty 1

## 2014-08-31 MED ORDER — ACETAMINOPHEN 325 MG PO TABS
ORAL_TABLET | ORAL | Status: AC
  Filled 2014-08-31: qty 2

## 2014-08-31 MED ORDER — DIPHENHYDRAMINE HCL 25 MG PO CAPS: 25.0000 mg | ORAL_CAPSULE | Freq: Once | ORAL | Status: DC

## 2014-08-31 MED ORDER — SODIUM CHLORIDE 0.9 % IJ SOLN
10.0000 mL | INTRAMUSCULAR | Status: AC | PRN
  Administered 2014-08-31: 10 mL
  Filled 2014-08-31: qty 10

## 2014-08-31 MED ORDER — NITROGLYCERIN 0.4 MG SL SUBL
SUBLINGUAL_TABLET | SUBLINGUAL | Status: AC
  Filled 2014-08-31: qty 1

## 2014-08-31 NOTE — Progress Notes (Signed)
At 1655, pt co of chest pain after ambulating to bathroom. Pulse ox 100 on RA, pulse  Of 110, Resp at 22. Pt stated she takes nitorglyercin tabs at home.  Nitro 0.4 mg given per subling as order. Pt denies nausea, jaw pain, dizziness or pain in shoulder.   At 1700,  VSS are BP 148/78, pulse 100, Repiration are 20, SaO2  At 100 on RA. States pain resolving, denies nausea, denies difficulty breathing.  AT 1705, pt  States pain is totatlly gone. Adair Laundry informed.  At 1710, VSS, denies chest pain, nausea or difficulty breathing or  Dizziness. BP is 138/72, Pulse 88, Resp 20 SaO2 is 99 on RA.  At 1715, deines distress. VSS at 138/70, Pulse is 82, Respirations are 18 SaO2 is 100 on RA. States " I'm fine now..ibuprofen have these episodes at home and I take Nitro pills for it".  Pt asked if she had brought nitro pills with her. Pt stated "No..... I didn't." Nurse Instructed pt on  complicance of  Carry Nitro medications with her at all times. At 1720 Denies distress... VSS at 120/74 pulsr is 78, Resp at 18, SaO2 at 100 on RA.  Denies nausea or distress... Denies chest pain.   At 1725, VSS at 122/82 P is 79, Resp at 18... Watching Television... Denies nausea or pain. At 1730... Pt states she is fine... VSS, denies nausea or pain....sipping soda. Skin warm dry, non cyanotic in colouring.  AT 1735, Pt denies distress..  VSS BP is 126/74, Pulse is 84, Respirations are even and unlaboured at 18.. SaO2 at 99 on RA, pulse even, skin warm and dry.  Pt discharged home.   Pt instructed to call 911 if having another episode of chest pain or  Difficulty breathin immediately. Pt verbalized understanding. History  Sexual Activity  . Sexual Activity: Not on file

## 2014-08-31 NOTE — Telephone Encounter (Signed)
Per Dr. [name], needs CBC check again on Friday. Faxed labs to [facility]. Jaritza agrees to come 9/25 at 1130 for recheck on labs.

## 2014-08-31 NOTE — Patient Instructions (Signed)
Platelet Transfusion Information °This is information about transfusions of platelets. Platelets are tiny cells made by the bone marrow and found in the blood. When a blood vessel is damaged, platelets rush to the damaged area to help form a clot. This begins the healing process. When platelets get very low, your blood may have trouble clotting. This may be from: °· Illness. °· Blood disorder. °· Chemotherapy to treat cancer. °Often, lower platelet counts do not cause problems.  °Platelets usually last for 7 to 10 days. If they are not used in an injury, they are broken down by the liver or spleen. °Symptoms of low platelet count include: °· Nosebleeds. °· Bleeding gums. °· Heavy periods. °· Bruising and tiny blood spots in the skin. °¨ Pinpoint spots of bleeding (petechiae). °¨ Larger bruises (purpura). °· Bleeding can be more serious if it happens in the brain or bowel. °Platelet transfusions are often used to keep the platelet count at an acceptable level. Serious bleeding due to low platelets is uncommon. °RISKS AND COMPLICATIONS °Severe side effects from platelet transfusions are uncommon. Minor reactions may include: °· Itching. °· Rashes. °· High temperature and shivering. °Medications are available to stop transfusion reactions. Let your health care provider know if you develop any of the above problems.  °If you are having platelet transfusions frequently, they may get less effective. This is called becoming refractory to platelets. It is uncommon. This can happen from non-immune causes and immune causes. Non-immune causes include: °· High temperatures. °· Some medications. °· An enlarged spleen. °Immune causes happen when your body discovers the platelets are not your own and begins making antibodies against them. The antibodies kill the platelets quickly. Even with platelet transfusions, you may still notice problems with bleeding or bruising. Let your health care providers know about this. Other things  can be done to help if this happens.  °BEFORE THE PROCEDURE  °· Your health care provider will check your platelet count regularly. °· If the platelet count is too low, it may be necessary to have a platelet transfusion. °· This is more important before certain procedures with a risk of bleeding, such as a spinal tap. °· Platelet transfusion reduces the risk of bleeding during or after the procedure. °· Except in emergencies, giving a transfusion requires a written consent. °Before blood is taken from a donor, a complete history is taken to make sure the person has no history of previous diseases, nor engages in risky social behavior. Examples of this are intravenous drug use or sexual activity with multiple partners. This could lead to infected blood or blood products being used. This history is taken in spite of the extensive testing to make sure the blood is safe. All blood products transfused are tested to make sure it is a match for the person getting the blood. It is also checked for infections. Blood is the safest it has ever been. The risk of getting an infection is very low. °PROCEDURE °· The platelets are stored in small plastic bags that are kept at a low temperature. °· Each bag is called a unit and sometimes two units are given. They are given through an intravenous line by drip infusion over about one-half hour. °· Usually blood is collected from multiple people to get enough to transfuse. °· Sometimes, the platelets are collected from a single person. This is done using a special machine that separates the platelets from the blood. The machine is called an apheresis machine. Platelets collected in this   way are called apheresed platelets. Apheresed platelets reduce the risk of becoming sensitive to the platelets. This lowers the chances of having a transfusion reaction. °· As it only takes a short time to give the platelets, this treatment can be given in an outpatient department. Platelets can also be  given before or after other treatments. °SEEK IMMEDIATE MEDICAL CARE IF: °You have any of the following symptoms over the next 12 hours or several days: °· Shaking chills. °· Fever with a temperature greater than 102°F (38.9°C) develops. °· Back pain or muscle pain. °· People around you feel you are not acting correctly, or you are confused. °· Blood in the urine or bowel movements, or bleeding from any place in your body. °· Shortness of breath, or difficulty breathing. °· Dizziness. °· Fainting. °· You break out in a rash or develop hives. °· Decrease in the amount of urine you are putting out, or the urine turns a dark color or changes to pink, red, or Allie. °· A severe headache or stiff neck. °· Bruising more easily. °Document Released: 09/21/2007 Document Revised: 04/10/2014 Document Reviewed: 09/21/2007 °ExitCare® Patient Information ©2015 [employer]. This information is not intended to replace advice given to you by your health care provider. Make sure you discuss any questions you have with your health care provider. ° °

## 2014-09-01 ENCOUNTER — Ambulatory Visit (HOSPITAL_BASED_OUTPATIENT_CLINIC_OR_DEPARTMENT_OTHER): Payer: Commercial Managed Care - HMO

## 2014-09-01 ENCOUNTER — Other Ambulatory Visit: Payer: Self-pay | Admitting: *Deleted

## 2014-09-01 ENCOUNTER — Other Ambulatory Visit (HOSPITAL_BASED_OUTPATIENT_CLINIC_OR_DEPARTMENT_OTHER): Payer: Commercial Managed Care - HMO

## 2014-09-01 ENCOUNTER — Other Ambulatory Visit: Payer: Self-pay | Admitting: Medical Oncology

## 2014-09-01 ENCOUNTER — Telehealth: Payer: Self-pay | Admitting: *Deleted

## 2014-09-01 VITALS — BP 158/56 | HR 87 | Temp 98.6°F | Resp 18

## 2014-09-01 DIAGNOSIS — C9502 Acute leukemia of unspecified cell type, in relapse: Secondary | ICD-10-CM

## 2014-09-01 DIAGNOSIS — C9200 Acute myeloblastic leukemia, not having achieved remission: Secondary | ICD-10-CM | POA: Diagnosis not present

## 2014-09-01 DIAGNOSIS — D62 Acute posthemorrhagic anemia: Secondary | ICD-10-CM

## 2014-09-01 DIAGNOSIS — D61818 Other pancytopenia: Secondary | ICD-10-CM

## 2014-09-01 DIAGNOSIS — D696 Thrombocytopenia, unspecified: Secondary | ICD-10-CM

## 2014-09-01 DIAGNOSIS — C9500 Acute leukemia of unspecified cell type not having achieved remission: Secondary | ICD-10-CM

## 2014-09-01 LAB — CBC WITH DIFFERENTIAL/PLATELET
BASO%: 0 % (ref 0.0–2.0)
Basophils Absolute: 0 10*3/uL (ref 0.0–0.1)
EOS%: 0 % (ref 0.0–7.0)
Eosinophils Absolute: 0 10*3/uL (ref 0.0–0.5)
HEMATOCRIT: 23.8 % — AB (ref 34.8–46.6)
HEMOGLOBIN: 7.9 g/dL — AB (ref 11.6–15.9)
LYMPH%: 73.9 % — ABNORMAL HIGH (ref 14.0–49.7)
MCH: 29 pg (ref 25.1–34.0)
MCHC: 33.2 g/dL (ref 31.5–36.0)
MCV: 87.5 fL (ref 79.5–101.0)
MONO#: 0 10*3/uL — ABNORMAL LOW (ref 0.1–0.9)
MONO%: 4.3 % (ref 0.0–14.0)
NEUT#: 0.1 10*3/uL — CL (ref 1.5–6.5)
NEUT%: 21.8 % — ABNORMAL LOW (ref 38.4–76.8)
NRBC: 0 % (ref 0–0)
Platelets: 21 10*3/uL — ABNORMAL LOW (ref 145–400)
RBC: 2.72 10*6/uL — ABNORMAL LOW (ref 3.70–5.45)
RDW: 15 % — AB (ref 11.2–14.5)
WBC: 0.2 10*3/uL — CL (ref 3.9–10.3)
lymph#: 0.2 10*3/uL — ABNORMAL LOW (ref 0.9–3.3)

## 2014-09-01 LAB — TYPE AND SCREEN
ABO/RH(D): O NEG
ANTIBODY SCREEN: NEGATIVE
UNIT DIVISION: 0
Unit division: 0

## 2014-09-01 LAB — PREPARE PLATELET PHERESIS: UNIT DIVISION: 0

## 2014-09-01 LAB — PREPARE RBC (CROSSMATCH)

## 2014-09-01 MED ORDER — LEVOFLOXACIN 250 MG PO TABS: 250.0000 mg | ORAL_TABLET | Freq: Every day | ORAL | Status: DC

## 2014-09-01 MED ORDER — DIPHENHYDRAMINE HCL 25 MG PO CAPS
ORAL_CAPSULE | ORAL | Status: AC
  Filled 2014-09-01: qty 1

## 2014-09-01 MED ORDER — FLUCONAZOLE 200 MG PO TABS: 200.0000 mg | ORAL_TABLET | Freq: Every day | ORAL | Status: DC

## 2014-09-01 MED ORDER — HEPARIN SOD (PORK) LOCK FLUSH 100 UNIT/ML IV SOLN
500.0000 [IU] | Freq: Every day | INTRAVENOUS | Status: AC | PRN
  Administered 2014-09-01: 500 [IU]
  Filled 2014-09-01: qty 5

## 2014-09-01 MED ORDER — ACETAMINOPHEN 325 MG PO TABS
650.0000 mg | ORAL_TABLET | Freq: Once | ORAL | Status: AC
  Administered 2014-09-01: 650 mg via ORAL

## 2014-09-01 MED ORDER — ACETAMINOPHEN 325 MG PO TABS
ORAL_TABLET | ORAL | Status: AC
  Filled 2014-09-01: qty 2

## 2014-09-01 MED ORDER — SODIUM CHLORIDE 0.9 % IJ SOLN
10.0000 mL | INTRAMUSCULAR | Status: AC | PRN
  Administered 2014-09-01: 10 mL
  Filled 2014-09-01: qty 10

## 2014-09-01 MED ORDER — DIPHENHYDRAMINE HCL 25 MG PO CAPS
25.0000 mg | ORAL_CAPSULE | Freq: Once | ORAL | Status: AC
  Administered 2014-09-01: 25 mg via ORAL

## 2014-09-01 MED ORDER — SODIUM CHLORIDE 0.9 % IV SOLN
250.0000 mL | Freq: Once | INTRAVENOUS | Status: AC
  Administered 2014-09-01: 250 mL via INTRAVENOUS

## 2014-09-01 NOTE — Patient Instructions (Addendum)
Go to [facility] outpatient lab on Saturday 09/02/14 for stat CBC to check platelets. Then proceed to [place] infusion room to await results and need to transfuse. Call for any fever or s/s of infection.      Blood Transfusion Information WHAT IS A BLOOD TRANSFUSION? A transfusion is the replacement of blood or some of its parts. Blood is made up of multiple cells which provide different functions.  Red blood cells carry oxygen and are used for blood loss replacement.  White blood cells fight against infection.  Platelets control bleeding.  Plasma helps clot blood.  Other blood products are available for specialized needs, such as hemophilia or other clotting disorders. BEFORE THE TRANSFUSION  Who gives blood for transfusions?   You may be able to donate blood to be used at a later date on yourself (autologous donation).  Relatives can be asked to donate blood. This is generally not any safer than if you have received blood from a stranger. The same precautions are taken to ensure safety when a relative's blood is donated.  Healthy volunteers who are fully evaluated to make sure their blood is safe. This is blood bank blood. Transfusion therapy is the safest it has ever been in the practice of medicine. Before blood is taken from a donor, a complete history is taken to make sure that person has no history of diseases nor engages in risky social behavior (examples are intravenous drug use or sexual activity with multiple partners). The donor's travel history is screened to minimize risk of transmitting infections, such as malaria. The donated blood is tested for signs of infectious diseases, such as HIV and hepatitis. The blood is then tested to be sure it is compatible with you in order to minimize the chance of a transfusion reaction. If you or a relative donates blood, this is often done in anticipation of surgery and is not appropriate for emergency situations. It takes  many days to process the donated blood. RISKS AND COMPLICATIONS Although transfusion therapy is very safe and saves many lives, the main dangers of transfusion include:   Getting an infectious disease.  Developing a transfusion reaction. This is an allergic reaction to something in the blood you were given. Every precaution is taken to prevent this. The decision to have a blood transfusion has been considered carefully by your caregiver before blood is given. Blood is not given unless the benefits outweigh the risks. AFTER THE TRANSFUSION  Right after receiving a blood transfusion, you will usually feel much better and more energetic. This is especially true if your red blood cells have gotten low (anemic). The transfusion raises the level of the red blood cells which carry oxygen, and this usually causes an energy increase.  The nurse administering the transfusion will monitor you carefully for complications. HOME CARE INSTRUCTIONS  No special instructions are needed after a transfusion. You may find your energy is better. Speak with your caregiver about any limitations on activity for underlying diseases you may have. SEEK MEDICAL CARE IF:   Your condition is not improving after your transfusion.  You develop redness or irritation at the intravenous (IV) site. SEEK IMMEDIATE MEDICAL CARE IF:  Any of the following symptoms occur over the next 12 hours:  Shaking chills.  You have a temperature by mouth above 102 F (38.9 C), not controlled by medicine.  Chest, back, or muscle pain.  People around you feel you are not acting correctly or are confused.  Shortness  of breath or difficulty breathing.  Dizziness and fainting.  You get a rash or develop hives.  You have a decrease in urine output.  Your urine turns a dark color or changes to pink, red, or Larin. Any of the following symptoms occur over the next 10 days:  You have a temperature by mouth above 102 F (38.9 C), not  controlled by medicine.  Shortness of breath.  Weakness after normal activity.  The white part of the eye turns yellow (jaundice).  You have a decrease in the amount of urine or are urinating less often.  Your urine turns a dark color or changes to pink, red, or Preston. Document Released: 11/21/2000 Document Revised: 02/16/2012 Document Reviewed: 07/10/2008 [facility] Patient Information 2015 [place], [place]. This information is not intended to replace advice given to you by your health care provider. Make sure you discuss any questions you have with your health care provider.

## 2014-09-01 NOTE — Telephone Encounter (Signed)
Husband left VM that nurse from [facility] just called him and told him to have [name] continue her antibiotics she was on when she was last neutropenic. He did not say what antibiotic and she does not have any except the acyclovir at home. Asking nurse to call in to her pharmacy. Noted at last nadir she was on Levaquin 250 mg daily + Diflucan 200 mg daily, so this was ordered. Called [name] back and made her aware to start her antibiotics tonight.

## 2014-09-01 NOTE — Progress Notes (Unsigned)
Faxed CBC results to [facility] [phone]

## 2014-09-01 NOTE — Telephone Encounter (Signed)
Per staff message and POF I have scheduled appts. Patient receive message with AVS . JMW

## 2014-09-02 ENCOUNTER — Ambulatory Visit (HOSPITAL_BASED_OUTPATIENT_CLINIC_OR_DEPARTMENT_OTHER): Payer: Commercial Managed Care - HMO

## 2014-09-02 ENCOUNTER — Other Ambulatory Visit: Payer: Self-pay | Admitting: *Deleted

## 2014-09-02 VITALS — BP 140/56 | HR 102 | Temp 99.5°F | Resp 18

## 2014-09-02 DIAGNOSIS — C9200 Acute myeloblastic leukemia, not having achieved remission: Secondary | ICD-10-CM | POA: Diagnosis not present

## 2014-09-02 DIAGNOSIS — D61818 Other pancytopenia: Secondary | ICD-10-CM

## 2014-09-02 DIAGNOSIS — C9502 Acute leukemia of unspecified cell type, in relapse: Secondary | ICD-10-CM

## 2014-09-02 DIAGNOSIS — D62 Acute posthemorrhagic anemia: Secondary | ICD-10-CM

## 2014-09-02 LAB — TYPE AND SCREEN
ABO/RH(D): O NEG
Antibody Screen: NEGATIVE
UNIT DIVISION: 0
Unit division: 0

## 2014-09-02 LAB — CBC
HEMATOCRIT: 28 % — AB (ref 36.0–46.0)
Hemoglobin: 9.4 g/dL — ABNORMAL LOW (ref 12.0–15.0)
MCH: 28.8 pg (ref 26.0–34.0)
MCHC: 33.6 g/dL (ref 30.0–36.0)
MCV: 85.9 fL (ref 78.0–100.0)
Platelets: 10 10*3/uL — CL (ref 150–400)
RBC: 3.26 MIL/uL — ABNORMAL LOW (ref 3.87–5.11)
RDW: 16 % — AB (ref 11.5–15.5)
WBC: 0.2 10*3/uL — CL (ref 4.0–10.5)

## 2014-09-02 MED ORDER — ACETAMINOPHEN 325 MG PO TABS
650.0000 mg | ORAL_TABLET | Freq: Once | ORAL | Status: AC
  Administered 2014-09-02: 650 mg via ORAL

## 2014-09-02 MED ORDER — DIPHENHYDRAMINE HCL 25 MG PO CAPS
25.0000 mg | ORAL_CAPSULE | Freq: Once | ORAL | Status: AC
  Administered 2014-09-02: 25 mg via ORAL

## 2014-09-02 MED ORDER — ACETAMINOPHEN 325 MG PO TABS
ORAL_TABLET | ORAL | Status: AC
  Filled 2014-09-02: qty 2

## 2014-09-02 MED ORDER — DIPHENHYDRAMINE HCL 25 MG PO CAPS
ORAL_CAPSULE | ORAL | Status: AC
  Filled 2014-09-02: qty 1

## 2014-09-02 NOTE — Progress Notes (Signed)
Per Dr. Jeral Fruit at [facility]- have pt proceed to [facility] for direct admission. Pt received platelets but did not get second unit of blood. Blood returned to blood bank.

## 2014-09-02 NOTE — Progress Notes (Signed)
Pt in infusion room for platelets and blood. Prior to initiation of platelets pt temp 100.4. Pt received Tylenol as premed. Fever increased to 100.6 at 15 min check. Pt neutropenic and was advised to start antibiotic last night however, she has not picked up prescription from pharmacy yet. MD oncall advised to call MD at [facility]. Awaiting return call from [facility] MD.

## 2014-09-04 ENCOUNTER — Other Ambulatory Visit: Payer: Commercial Managed Care - HMO

## 2014-09-04 ENCOUNTER — Ambulatory Visit: Payer: Commercial Managed Care - HMO | Admitting: Oncology

## 2014-09-04 LAB — PREPARE PLATELET PHERESIS: Unit division: 0

## 2014-09-07 ENCOUNTER — Other Ambulatory Visit: Payer: Commercial Managed Care - HMO

## 2014-09-14 ENCOUNTER — Telehealth: Payer: Self-pay | Admitting: Oncology

## 2014-09-14 NOTE — Telephone Encounter (Signed)
Christy from [facility] called is sending lab req for this coming wk, sch labs and also r/s lab/ov from 09/28 where pt was hospitalized. Pt is to p/u sch on lab visit 10/12 per edit note.... KJ

## 2014-09-15 ENCOUNTER — Other Ambulatory Visit: Payer: Self-pay | Admitting: *Deleted

## 2014-09-15 NOTE — Progress Notes (Signed)
Received faxed orders from [facility] for weekly CBC CMET and Mag to be drawn 10/12 and then weekly. Pt is not to receive any growth factors. They recommend transfusion for PLT less than 20 or HGB less than 9. All blood products need to be irradiated. Pre-medications for blood products also on this order. (Sent to be scanned.) Pt is scheduled for lab/office visit 10/19 will schedule further labs then.

## 2014-09-18 ENCOUNTER — Other Ambulatory Visit (HOSPITAL_BASED_OUTPATIENT_CLINIC_OR_DEPARTMENT_OTHER): Payer: Commercial Managed Care - HMO

## 2014-09-18 ENCOUNTER — Other Ambulatory Visit: Payer: Commercial Managed Care - HMO

## 2014-09-18 DIAGNOSIS — C9200 Acute myeloblastic leukemia, not having achieved remission: Secondary | ICD-10-CM

## 2014-09-18 DIAGNOSIS — D61818 Other pancytopenia: Secondary | ICD-10-CM

## 2014-09-18 LAB — CBC WITH DIFFERENTIAL/PLATELET
BASO%: 0.3 % (ref 0.0–2.0)
Basophils Absolute: 0 10*3/uL (ref 0.0–0.1)
EOS ABS: 0 10*3/uL (ref 0.0–0.5)
EOS%: 0 % (ref 0.0–7.0)
HEMATOCRIT: 29.7 % — AB (ref 34.8–46.6)
HGB: 10 g/dL — ABNORMAL LOW (ref 11.6–15.9)
LYMPH#: 0.8 10*3/uL — AB (ref 0.9–3.3)
LYMPH%: 9.3 % — ABNORMAL LOW (ref 14.0–49.7)
MCH: 28.6 pg (ref 25.1–34.0)
MCHC: 33.5 g/dL (ref 31.5–36.0)
MCV: 85.4 fL (ref 79.5–101.0)
MONO#: 2 10*3/uL — ABNORMAL HIGH (ref 0.1–0.9)
MONO%: 23.6 % — ABNORMAL HIGH (ref 0.0–14.0)
NEUT%: 66.8 % (ref 38.4–76.8)
NEUTROS ABS: 5.6 10*3/uL (ref 1.5–6.5)
Platelets: 108 10*3/uL — ABNORMAL LOW (ref 145–400)
RBC: 3.48 10*6/uL — ABNORMAL LOW (ref 3.70–5.45)
RDW: 15.5 % — ABNORMAL HIGH (ref 11.2–14.5)
WBC: 8.5 10*3/uL (ref 3.9–10.3)

## 2014-09-18 LAB — COMPREHENSIVE METABOLIC PANEL (CC13)
ALT: 13 U/L (ref 0–55)
ANION GAP: 11 meq/L (ref 3–11)
AST: 15 U/L (ref 5–34)
Albumin: 2.7 g/dL — ABNORMAL LOW (ref 3.5–5.0)
Alkaline Phosphatase: 270 U/L — ABNORMAL HIGH (ref 40–150)
BUN: 14.3 mg/dL (ref 7.0–26.0)
CALCIUM: 8.8 mg/dL (ref 8.4–10.4)
CO2: 26 meq/L (ref 22–29)
Chloride: 109 mEq/L (ref 98–109)
Creatinine: 1 mg/dL (ref 0.6–1.1)
Glucose: 155 mg/dl — ABNORMAL HIGH (ref 70–140)
Potassium: 3.6 mEq/L (ref 3.5–5.1)
SODIUM: 147 meq/L — AB (ref 136–145)
TOTAL PROTEIN: 6.1 g/dL — AB (ref 6.4–8.3)
Total Bilirubin: 0.38 mg/dL (ref 0.20–1.20)

## 2014-09-18 LAB — MAGNESIUM (CC13): Magnesium: 1.6 mg/dl (ref 1.5–2.5)

## 2014-09-25 ENCOUNTER — Ambulatory Visit (HOSPITAL_BASED_OUTPATIENT_CLINIC_OR_DEPARTMENT_OTHER): Payer: Commercial Managed Care - HMO | Admitting: Nurse Practitioner

## 2014-09-25 ENCOUNTER — Other Ambulatory Visit (HOSPITAL_BASED_OUTPATIENT_CLINIC_OR_DEPARTMENT_OTHER): Payer: Commercial Managed Care - HMO

## 2014-09-25 ENCOUNTER — Telehealth: Payer: Self-pay | Admitting: Oncology

## 2014-09-25 VITALS — BP 173/66 | HR 92 | Temp 97.3°F | Resp 20 | Ht 62.0 in | Wt 155.1 lb

## 2014-09-25 DIAGNOSIS — C9201 Acute myeloblastic leukemia, in remission: Secondary | ICD-10-CM

## 2014-09-25 DIAGNOSIS — D61810 Antineoplastic chemotherapy induced pancytopenia: Secondary | ICD-10-CM

## 2014-09-25 DIAGNOSIS — C9200 Acute myeloblastic leukemia, not having achieved remission: Secondary | ICD-10-CM

## 2014-09-25 DIAGNOSIS — C9500 Acute leukemia of unspecified cell type not having achieved remission: Secondary | ICD-10-CM

## 2014-09-25 LAB — COMPREHENSIVE METABOLIC PANEL (CC13)
ALT: 11 U/L (ref 0–55)
AST: 16 U/L (ref 5–34)
Albumin: 2.7 g/dL — ABNORMAL LOW (ref 3.5–5.0)
Alkaline Phosphatase: 252 U/L — ABNORMAL HIGH (ref 40–150)
Anion Gap: 9 mEq/L (ref 3–11)
BUN: 16 mg/dL (ref 7.0–26.0)
CALCIUM: 8.4 mg/dL (ref 8.4–10.4)
CO2: 26 mEq/L (ref 22–29)
Chloride: 111 mEq/L — ABNORMAL HIGH (ref 98–109)
Creatinine: 1.5 mg/dL — ABNORMAL HIGH (ref 0.6–1.1)
Glucose: 164 mg/dl — ABNORMAL HIGH (ref 70–140)
Potassium: 4.3 mEq/L (ref 3.5–5.1)
Sodium: 145 mEq/L (ref 136–145)
TOTAL PROTEIN: 5.8 g/dL — AB (ref 6.4–8.3)
Total Bilirubin: 0.38 mg/dL (ref 0.20–1.20)

## 2014-09-25 LAB — CBC WITH DIFFERENTIAL/PLATELET
BASO%: 0.2 % (ref 0.0–2.0)
BASOS ABS: 0 10*3/uL (ref 0.0–0.1)
EOS%: 0 % (ref 0.0–7.0)
Eosinophils Absolute: 0 10*3/uL (ref 0.0–0.5)
HCT: 29.6 % — ABNORMAL LOW (ref 34.8–46.6)
HEMOGLOBIN: 9.7 g/dL — AB (ref 11.6–15.9)
LYMPH#: 0.7 10*3/uL — AB (ref 0.9–3.3)
LYMPH%: 18.1 % (ref 14.0–49.7)
MCH: 28.8 pg (ref 25.1–34.0)
MCHC: 32.8 g/dL (ref 31.5–36.0)
MCV: 87.8 fL (ref 79.5–101.0)
MONO#: 0.9 10*3/uL (ref 0.1–0.9)
MONO%: 21.8 % — ABNORMAL HIGH (ref 0.0–14.0)
NEUT#: 2.4 10*3/uL (ref 1.5–6.5)
NEUT%: 59.9 % (ref 38.4–76.8)
Platelets: 151 10*3/uL (ref 145–400)
RBC: 3.37 10*6/uL — ABNORMAL LOW (ref 3.70–5.45)
RDW: 15.7 % — AB (ref 11.2–14.5)
WBC: 4 10*3/uL (ref 3.9–10.3)

## 2014-09-25 LAB — MAGNESIUM (CC13): Magnesium: 1.6 mg/dl (ref 1.5–2.5)

## 2014-09-25 NOTE — Telephone Encounter (Signed)
gv and prnted appt sched and avs for pt for DEc

## 2014-09-25 NOTE — Progress Notes (Addendum)
  [place] OFFICE PROGRESS NOTE   Diagnosis:  AML  INTERVAL HISTORY:   Ms. Mcdonnell was admitted to [facility] on 08/22/2014 for cycle 3 high-dose cytarabine chemotherapy. She was readmitted on 09/02/2014 with febrile neutropenia/sepsis secondary to Escherichia coli bacteremia. Followup bone marrow biopsy on 09/15/2014 showed necrotic marrow with minute focus of myeloid precursors. No evidence of viable acute leukemia. She was discharged home on 09/15/2014.  She feels her strength is slowly improving. She denies fever. No shaking chills. Appetite is better. She denies bleeding. No pain. She has stable mild dyspnea on exertion. She has occasional nausea. No vomiting. Bowels moving regularly. No diarrhea. No hematuria or dysuria.  Objective:  Vital signs in last 24 hours:  Blood pressure 173/66, pulse 92, temperature 97.3 F (36.3 C), temperature source Oral, resp. rate 20, height $RemoveBe'5\' 2"'HoreGykxv$  (1.575 m), weight 155 lb 1.6 oz (70.353 kg), SpO2 99.00%.    HEENT: No thrush or ulcers. Lymphatics: No palpable cervical or supraclavicular lymph nodes. Resp: Breath sounds diminished right base. No respiratory distress. Cardio: Regular rate and rhythm. GI: Abdomen soft and nontender. No organomegaly. Vascular: 1+ pitting edema at the lower legs bilaterally. Skin: Ecchymoses scattered over the forearms.  Port-A-Cath without erythema.  Lab Results:  Lab Results  Component Value Date   WBC 4.0 09/25/2014   HGB 9.7* 09/25/2014   HCT 29.6* 09/25/2014   MCV 87.8 09/25/2014   PLT 151 09/25/2014   NEUTROABS 2.4 09/25/2014    Imaging:  No results found.  Medications: I have reviewed the patient's current medications.  Assessment/Plan: 1. Acute myelogenous leukemia, monocytic differentiation diagnosed in March of 2015  Induction cytarabine/daunorubicin (7+3) followed by reinduction with cytarabine/daunorubicin and zinecard (5+2) with a recovering bone marrow 05/16/2014 consistent with  remission  status post cycle 1 high-dose araC consolidation 05/30/2014  Status post cycle 2 high-dose araC consolidation 07/11/2014. Status post cycle 3 high-dose araC consolidation 08/22/2014. Bone marrow 09/15/2014 showed necrotic marrow with minute focus of myeloid precursors. No evidence of viable acute leukemia. 2. pancytopenia secondary to chemotherapy. Improved.  3. C. difficile colitis-06/09/2014 treated with Flagyl  4. Diabetes  5. renal insufficiency  6. history of hypokalemia and hypomagnesemia. She continues potassium and magnesium supplements.  7. history of atrial fibrillation  9. history of coronary artery disease  10. Hospitalization with febrile neutropenia/sepsis secondary to Escherichia coli bacteremia 09/02/2014.  Disposition: [name] continues to recover from the recent chemotherapy and subsequent hospitalization with Escherichia coli sepsis in the setting of neutropenia. Blood counts have recovered. She has a followup visit with Dr. Florene Glen on 10/13/2014. We scheduled a followup visit here in 8 weeks with a Port-A-Cath flush. She will contact the office prior to her next visit with any problems.  Patient seen with Dr. [name].    Ned Card ANP/GNP-BC   09/25/2014  1:17 PM  This was a shared visit with Ned Card.  Ms. Mara is in clinical remission from AML. She continues followup with the leukemia service at [facility]. She will return for an office visit here in 2 months.  Julieanne Manson, M.D.

## 2014-09-29 ENCOUNTER — Encounter: Payer: Self-pay | Admitting: Family Medicine

## 2014-09-29 ENCOUNTER — Ambulatory Visit (INDEPENDENT_AMBULATORY_CARE_PROVIDER_SITE_OTHER): Payer: Commercial Managed Care - HMO | Admitting: Family Medicine

## 2014-09-29 VITALS — BP 116/72 | HR 73 | Temp 97.9°F | Ht 62.0 in | Wt 150.5 lb

## 2014-09-29 DIAGNOSIS — B029 Zoster without complications: Secondary | ICD-10-CM

## 2014-09-29 MED ORDER — VALACYCLOVIR HCL 1 G PO TABS: 1000.0000 mg | ORAL_TABLET | Freq: Three times a day (TID) | ORAL | Status: DC

## 2014-09-29 MED ORDER — OXYCODONE-ACETAMINOPHEN 7.5-325 MG PO TABS: 1.0000 | ORAL_TABLET | ORAL | Status: DC | PRN

## 2014-09-29 NOTE — Progress Notes (Signed)
No chief complaint on file.   HPI:  Acute visit for:  1) skin rash: -a little pain R larter abd - then rash started yesterday, burning and itchy pain -denies: malaise, fevers, nvd, HA -request refill on pain med for the shingles pain  ROS: See pertinent positives and negatives per HPI.  Past Medical History  Diagnosis Date  . DIABETES MELLITUS, TYPE II 07/13/2007  . HYPERLIPIDEMIA 12/24/2007  . HYPERTENSION 07/13/2007  . CORONARY ARTERY DISEASE 12/24/2007  . NEPHROLITHIASIS, HX OF 12/24/2007  . Colon polyps 04/29/2010    TUBULAR ADENOMA AND A SERRATED ADENOMA  . Acute pancreatitis   . Unspecified disease of pancreas   . Chronic kidney disease, stage II (mild)     Past Surgical History  Procedure Laterality Date  . Excisional hemorrhoidectomy  unknown  . Abdominal hysterectomy  unknown  . Nasal sinus surgery  unknown  . Cardiac catheterization  2002  . Cataract extraction Bilateral     Family History  Problem Relation Age of Onset  . Cancer Father     throat ca  . COPD Father   . Cancer Sister     uterine ca and breast ca  . Cancer Brother     throat    History   Social History  . Marital Status: Married    Spouse Name: N/A    Number of Children: N/A  . Years of Education: N/A   Social History Main Topics  . Smoking status: Former Smoker    Types: Cigarettes    Quit date: 12/08/2000  . Smokeless tobacco: Never Used  . Alcohol Use: No  . Drug Use: No  . Sexual Activity: None   Other Topics Concern  . None   Social History Narrative  . None    Current outpatient prescriptions:acetaminophen (TYLENOL) 325 MG tablet, Take 2 tablets (650 mg total) by mouth every 6 (six) hours as needed for mild pain (or Fever >/= 101)., Disp: , Rfl: ;  acyclovir (ZOVIRAX) 400 MG tablet, Take 1 tablet (400 mg total) by mouth daily., Disp: 30 tablet, Rfl: 0;  aminocaproic acid (AMICAR) 500 MG tablet, Take 2 tablets (1,000 mg total) by mouth every 8 (eight) hours., Disp: 30  tablet, Rfl: 0 amLODipine (NORVASC) 5 MG tablet, Take 5 mg by mouth daily., Disp: , Rfl: ;  fluconazole (DIFLUCAN) 200 MG tablet, Take 1 tablet (200 mg total) by mouth daily., Disp: 30 tablet, Rfl: 0;  furosemide (LASIX) 40 MG tablet, Take 40 mg by mouth., Disp: , Rfl: ;  levofloxacin (LEVAQUIN) 250 MG tablet, Take 1 tablet (250 mg total) by mouth daily., Disp: 30 tablet, Rfl: 0 LORazepam (ATIVAN) 0.5 MG tablet, Take 1 tablet (0.5 mg total) by mouth every 6 (six) hours as needed for anxiety., Disp: 90 tablet, Rfl: 2;  magnesium oxide (MAG-OX) 400 (241.3 MG) MG tablet, Take 2 tablets (800 mg total) by mouth 2 (two) times daily., Disp: 60 tablet, Rfl: 0;  metoprolol (LOPRESSOR) 50 MG tablet, Take 50 mg by mouth 2 (two) times daily., Disp: , Rfl:  nitroGLYCERIN (NITROSTAT) 0.4 MG SL tablet, Place 1 tablet (0.4 mg total) under the tongue every 5 (five) minutes as needed for chest pain., Disp: 60 tablet, Rfl: 5;  oxyCODONE (OXY IR/ROXICODONE) 5 MG immediate release tablet, Take 5 mg by mouth., Disp: , Rfl: ;  oxyCODONE-acetaminophen (PERCOCET) 7.5-325 MG per tablet, Take 1 tablet by mouth every 4 (four) hours as needed for pain., Disp: 60 tablet, Rfl: 0 pantoprazole (PROTONIX) 40  MG tablet, Take 1 tablet (40 mg total) by mouth daily., Disp: 30 tablet, Rfl: 4;  polyethylene glycol (MIRALAX / GLYCOLAX) packet, Take 17 g by mouth daily as needed for moderate constipation., Disp: 14 each, Rfl: 0;  potassium chloride SA (K-DUR,KLOR-CON) 20 MEQ tablet, Take 1 tablet (20 mEq total) by mouth daily., Disp: 30 tablet, Rfl: 0 promethazine (PHENERGAN) 12.5 MG tablet, Take 2 tablets (25 mg total) by mouth every 6 (six) hours as needed for nausea., Disp: 12 tablet, Rfl: 0;  simvastatin (ZOCOR) 20 MG tablet, Take 20 mg by mouth every evening., Disp: , Rfl: ;  sodium polystyrene (KAYEXALATE) powder, Take 60 grams now--Today only 06/29/14, Disp: 60 g, Rfl: 0 valACYclovir (VALTREX) 1000 MG tablet, Take 1 tablet (1,000 mg total) by  mouth 3 (three) times daily., Disp: 21 tablet, Rfl: 0 No current facility-administered medications for this visit. Facility-Administered Medications Ordered in Other Visits: sodium chloride 0.9 % 1,000 mL with magnesium sulfate 2 g infusion, , Intravenous, Continuous, Ladell Pier, MD  EXAMDanley Danker Vitals:   09/29/14 1425  BP: 116/72  Pulse: 73  Temp: 97.9 F (36.6 C)    Body mass index is 27.52 kg/(m^2).  GENERAL: vitals reviewed and listed above, alert, oriented, appears well hydrated and in no acute distress  HEENT: atraumatic, conjunttiva clear, no obvious abnormalities on inspection of external nose and ears  NECK: no obvious masses on inspection  SKIN: Two patches of erythematous vesicular lesions on R flank in dermatomal pattern  MS: moves all extremities without noticeable abnormality  PSYCH: pleasant and cooperative, no obvious depression or anxiety  ASSESSMENT AND PLAN:  Discussed the following assessment and plan:  Shingles - Plan: oxyCODONE-acetaminophen (PERCOCET) 7.5-325 MG per tablet, valACYclovir (VALTREX) 1000 MG tablet  -Patient advised to return or notify a doctor immediately if symptoms worsen or persist or new concerns arise.  Patient Instructions  BEFORE YOU LEAVE: -schedule follow up with Dr. Burnice Logan next week  Take the valtrex starting today   Shingles Shingles is caused by the same virus that causes chickenpox. The first feelings may be pain or tingling. A rash will follow in a couple days. The rash may occur on any area of the body. Long-lasting pain is more likely in an elderly person. It can last months to years. There are medicines that can help prevent pain if you start taking them early. HOME CARE   Take cool baths or place cool cloths on the rash as told by your doctor.  Take medicine only as told by your doctor.  Rest as told by your doctor.  Keep your rash clean with mild soap and cool water or as told by your doctor.  Do  not scratch your rash. You may use calamine lotion to relieve itchy skin as told by your doctor.  Keep your rash covered with a loose bandage (dressing).  Avoid touching:  Babies.  Pregnant women.  Children with inflamed skin (eczema).  People who have gotten organ transplants.  People with chronic illnesses, such as leukemia or AIDS.  Wear loose-fitting clothing.  If the rash is on the face, you may need to see a specialist. Keep all appointments. Shingles must be kept away from the eyes, if possible.  Keep all follow-up visits as told by your doctor. GET HELP RIGHT AWAY IF:   You have any pain on the face or eye.  You lose feeling on one side of your face.  You have ear pain or ringing in  your ear.  You cannot taste as well.  Your medicines do not help the pain.  Your redness or puffiness (swelling) spreads.  You feel like you are getting worse.  You have a fever. MAKE SURE YOU:   Understand these instructions.  Will watch your condition.  Will get help right away if you are not doing well or get worse. Document Released: 05/12/2008 Document Revised: 04/10/2014 Document Reviewed: 05/12/2008 [facility] Patient Information 2015 [place], [place]. This information is not intended to replace advice given to you by your health care provider. Make sure you discuss any questions you have with your health care provider.      [name] R.

## 2014-09-29 NOTE — Patient Instructions (Signed)
BEFORE YOU LEAVE: -schedule follow up with Dr. Burnice Logan next week  Take the valtrex starting today   Shingles Shingles is caused by the same virus that causes chickenpox. The first feelings may be pain or tingling. A rash will follow in a couple days. The rash may occur on any area of the body. Long-lasting pain is more likely in an elderly person. It can last months to years. There are medicines that can help prevent pain if you start taking them early. HOME CARE   Take cool baths or place cool cloths on the rash as told by your doctor.  Take medicine only as told by your doctor.  Rest as told by your doctor.  Keep your rash clean with mild soap and cool water or as told by your doctor.  Do not scratch your rash. You may use calamine lotion to relieve itchy skin as told by your doctor.  Keep your rash covered with a loose bandage (dressing).  Avoid touching:  Babies.  Pregnant women.  Children with inflamed skin (eczema).  People who have gotten organ transplants.  People with chronic illnesses, such as leukemia or AIDS.  Wear loose-fitting clothing.  If the rash is on the face, you may need to see a specialist. Keep all appointments. Shingles must be kept away from the eyes, if possible.  Keep all follow-up visits as told by your doctor. GET HELP RIGHT AWAY IF:   You have any pain on the face or eye.  You lose feeling on one side of your face.  You have ear pain or ringing in your ear.  You cannot taste as well.  Your medicines do not help the pain.  Your redness or puffiness (swelling) spreads.  You feel like you are getting worse.  You have a fever. MAKE SURE YOU:   Understand these instructions.  Will watch your condition.  Will get help right away if you are not doing well or get worse. Document Released: 05/12/2008 Document Revised: 04/10/2014 Document Reviewed: 05/12/2008 [facility] Patient Information 2015 [place], [place]. This information is  not intended to replace advice given to you by your health care provider. Make sure you discuss any questions you have with your health care provider.

## 2014-09-29 NOTE — Progress Notes (Signed)
Pre visit review using our clinic review tool, if applicable. No additional management support is needed unless otherwise documented below in the visit note. 

## 2014-10-06 ENCOUNTER — Encounter: Payer: Self-pay | Admitting: Internal Medicine

## 2014-10-06 ENCOUNTER — Encounter: Payer: Self-pay | Admitting: *Deleted

## 2014-10-06 ENCOUNTER — Ambulatory Visit (INDEPENDENT_AMBULATORY_CARE_PROVIDER_SITE_OTHER): Payer: Commercial Managed Care - HMO | Admitting: Internal Medicine

## 2014-10-06 VITALS — BP 120/58 | HR 68 | Temp 97.8°F | Resp 18 | Ht 62.0 in | Wt 149.0 lb

## 2014-10-06 DIAGNOSIS — B029 Zoster without complications: Secondary | ICD-10-CM

## 2014-10-06 MED ORDER — OXYCODONE-ACETAMINOPHEN 7.5-325 MG PO TABS: 1.0000 | ORAL_TABLET | ORAL | Status: DC | PRN

## 2014-10-06 MED ORDER — LORAZEPAM 0.5 MG PO TABS: 0.5000 mg | ORAL_TABLET | Freq: Four times a day (QID) | ORAL | Status: DC | PRN

## 2014-10-06 NOTE — Progress Notes (Signed)
Subjective:    Patient ID: [name], female    DOB: May 26, 1941, 73 y.o.   MRN: [id number]  HPI 73 year old patient who is seen today in follow-up.  She was seen last week with an acute herpetic eruption involving her right flank and chest wall area.  She has been on analgesics with some improvement.  She is completing antiviral therapy.  She states that she has had a remote history of prior shingles and no history of receiving a shingles vaccine.  She will discuss with oncology.  The timing of a possible future shingles vaccine  Past Medical History  Diagnosis Date  . DIABETES MELLITUS, TYPE II 07/13/2007  . HYPERLIPIDEMIA 12/24/2007  . HYPERTENSION 07/13/2007  . CORONARY ARTERY DISEASE 12/24/2007  . NEPHROLITHIASIS, HX OF 12/24/2007  . Colon polyps 04/29/2010    TUBULAR ADENOMA AND A SERRATED ADENOMA  . Acute pancreatitis   . Unspecified disease of pancreas   . Chronic kidney disease, stage II (mild)     History   Social History  . Marital Status: Married    Spouse Name: N/A    Number of Children: N/A  . Years of Education: N/A   Occupational History  . Not on file.   Social History Main Topics  . Smoking status: Former Smoker    Types: Cigarettes    Quit date: 12/08/2000  . Smokeless tobacco: Never Used  . Alcohol Use: No  . Drug Use: No  . Sexual Activity: Not on file   Other Topics Concern  . Not on file   Social History Narrative  . No narrative on file    Past Surgical History  Procedure Laterality Date  . Excisional hemorrhoidectomy  unknown  . Abdominal hysterectomy  unknown  . Nasal sinus surgery  unknown  . Cardiac catheterization  2002  . Cataract extraction Bilateral     Family History  Problem Relation Age of Onset  . Cancer Father     throat ca  . COPD Father   . Cancer Sister     uterine ca and breast ca  . Cancer Brother     throat    No Known Allergies  Current Outpatient Prescriptions on File Prior to Visit  Medication Sig  Dispense Refill  . acetaminophen (TYLENOL) 325 MG tablet Take 2 tablets (650 mg total) by mouth every 6 (six) hours as needed for mild pain (or Fever >/= 101).      Marland Kitchen aminocaproic acid (AMICAR) 500 MG tablet Take 2 tablets (1,000 mg total) by mouth every 8 (eight) hours.  30 tablet  0  . amLODipine (NORVASC) 5 MG tablet Take 5 mg by mouth daily.      . furosemide (LASIX) 40 MG tablet Take 40 mg by mouth.      . magnesium oxide (MAG-OX) 400 (241.3 MG) MG tablet Take 2 tablets (800 mg total) by mouth 2 (two) times daily.  60 tablet  0  . metoprolol (LOPRESSOR) 50 MG tablet Take 50 mg by mouth 2 (two) times daily.      . nitroGLYCERIN (NITROSTAT) 0.4 MG SL tablet Place 1 tablet (0.4 mg total) under the tongue every 5 (five) minutes as needed for chest pain.  60 tablet  5  . oxyCODONE (OXY IR/ROXICODONE) 5 MG immediate release tablet Take 5 mg by mouth.      . pantoprazole (PROTONIX) 40 MG tablet Take 1 tablet (40 mg total) by mouth daily.  30 tablet  4  . polyethylene  glycol (MIRALAX / GLYCOLAX) packet Take 17 g by mouth daily as needed for moderate constipation.  14 each  0  . potassium chloride SA (K-DUR,KLOR-CON) 20 MEQ tablet Take 1 tablet (20 mEq total) by mouth daily.  30 tablet  0  . promethazine (PHENERGAN) 12.5 MG tablet Take 2 tablets (25 mg total) by mouth every 6 (six) hours as needed for nausea.  12 tablet  0  . simvastatin (ZOCOR) 20 MG tablet Take 20 mg by mouth every evening.      . sodium polystyrene (KAYEXALATE) powder Take 60 grams now--Today only 06/29/14  60 g  0  . valACYclovir (VALTREX) 1000 MG tablet Take 1 tablet (1,000 mg total) by mouth 3 (three) times daily.  21 tablet  0   Current Facility-Administered Medications on File Prior to Visit  Medication Dose Route Frequency Provider Last Rate Last Dose  . sodium chloride 0.9 % 1,000 mL with magnesium sulfate 2 g infusion   Intravenous Continuous Ladell Pier, MD        BP 120/58  Pulse 68  Temp(Src) 97.8 F (36.6 C)  (Oral)  Resp 18  Ht 5\' 2"  (1.575 m)  Wt 149 lb (67.586 kg)  BMI 27.25 kg/m2  SpO2 96%      Review of Systems  Skin: Positive for rash.       Objective:   Physical Exam  Constitutional: She is oriented to person, place, and time. She appears well-developed and well-nourished.  HENT:  Head: Normocephalic.  Right Ear: External ear normal.  Left Ear: External ear normal.  Mouth/Throat: Oropharynx is clear and moist.  Eyes: Conjunctivae and EOM are normal. Pupils are equal, round, and reactive to light.  Neck: Normal range of motion. Neck supple. No thyromegaly present.  Cardiovascular: Normal rate, regular rhythm, normal heart sounds and intact distal pulses.   Pulmonary/Chest: Effort normal and breath sounds normal.  Abdominal: Soft. Bowel sounds are normal. She exhibits no mass. There is no tenderness.  Musculoskeletal: Normal range of motion.  Lymphadenopathy:    She has no cervical adenopathy.  Neurological: She is alert and oriented to person, place, and time.  Skin: Skin is warm and dry. Rash noted.  Two  Patchy areas of dry erythematous scaly dermatitis consistent with resolving herpetic lesions  Psychiatric: She has a normal mood and affect. Her behavior is normal.          Assessment & Plan:   Shingles.  Will complete Valtrex.  Will continue to treat symptomatically with analgesics AML.  Follow-up oncology History of hypertension, stable History diet controlled diabetes.  Will reassess in 6 months

## 2014-10-06 NOTE — Progress Notes (Signed)
Pre visit review using our clinic review tool, if applicable. No additional management support is needed unless otherwise documented below in the visit note. 

## 2014-10-06 NOTE — Patient Instructions (Signed)
Shingles Shingles is caused by the same virus that causes chickenpox. The first feelings may be pain or tingling. A rash will follow in a couple days. The rash may occur on any area of the body. Long-lasting pain is more likely in an elderly person. It can last months to years. There are medicines that can help prevent pain if you start taking them early. HOME CARE   Take cool baths or place cool cloths on the rash as told by your doctor.  Take medicine only as told by your doctor.  Rest as told by your doctor.  Keep your rash clean with mild soap and cool water or as told by your doctor.  Do not scratch your rash. You may use calamine lotion to relieve itchy skin as told by your doctor.  Keep your rash covered with a loose bandage (dressing).  Avoid touching:  Babies.  Pregnant women.  Children with inflamed skin (eczema).  People who have gotten organ transplants.  People with chronic illnesses, such as leukemia or AIDS.  Wear loose-fitting clothing.  If the rash is on the face, you may need to see a specialist. Keep all appointments. Shingles must be kept away from the eyes, if possible.  Keep all follow-up visits as told by your doctor. GET HELP RIGHT AWAY IF:   You have any pain on the face or eye.  You lose feeling on one side of your face.  You have ear pain or ringing in your ear.  You cannot taste as well.  Your medicines do not help the pain.  Your redness or puffiness (swelling) spreads.  You feel like you are getting worse.  You have a fever. MAKE SURE YOU:   Understand these instructions.  Will watch your condition.  Will get help right away if you are not doing well or get worse. Document Released: 05/12/2008 Document Revised: 04/10/2014 Document Reviewed: 05/12/2008 [employer] Patient Information 2015 [employer]. This information is not intended to replace advice given to you by your health care provider. Make sure you discuss any questions  you have with your health care provider.  

## 2014-10-18 ENCOUNTER — Other Ambulatory Visit: Payer: Self-pay | Admitting: *Deleted

## 2014-10-18 ENCOUNTER — Telehealth: Payer: Self-pay | Admitting: Oncology

## 2014-10-18 DIAGNOSIS — C9201 Acute myeloblastic leukemia, in remission: Secondary | ICD-10-CM

## 2014-10-18 NOTE — Progress Notes (Signed)
Received fax from [facility] requesting labs 11/17. Orders entered, request to schedulers for appt.

## 2014-10-18 NOTE — Telephone Encounter (Signed)
Lft msg for pt labs only per 11/11 POF, mailed sch to pt...Marland KitchenMarland KitchenMarland Kitchen KJ

## 2014-10-24 ENCOUNTER — Other Ambulatory Visit (HOSPITAL_BASED_OUTPATIENT_CLINIC_OR_DEPARTMENT_OTHER): Payer: Commercial Managed Care - HMO

## 2014-10-24 DIAGNOSIS — C9201 Acute myeloblastic leukemia, in remission: Secondary | ICD-10-CM

## 2014-10-24 DIAGNOSIS — D61810 Antineoplastic chemotherapy induced pancytopenia: Secondary | ICD-10-CM

## 2014-10-24 LAB — CBC WITH DIFFERENTIAL/PLATELET
BASO%: 0.2 % (ref 0.0–2.0)
Basophils Absolute: 0 10*3/uL (ref 0.0–0.1)
EOS%: 4.8 % (ref 0.0–7.0)
Eosinophils Absolute: 0.2 10*3/uL (ref 0.0–0.5)
HEMATOCRIT: 33.4 % — AB (ref 34.8–46.6)
HGB: 10.7 g/dL — ABNORMAL LOW (ref 11.6–15.9)
LYMPH#: 0.8 10*3/uL — AB (ref 0.9–3.3)
LYMPH%: 17.4 % (ref 14.0–49.7)
MCH: 30.8 pg (ref 25.1–34.0)
MCHC: 32.1 g/dL (ref 31.5–36.0)
MCV: 96.1 fL (ref 79.5–101.0)
MONO#: 0.5 10*3/uL (ref 0.1–0.9)
MONO%: 12 % (ref 0.0–14.0)
NEUT%: 65.6 % (ref 38.4–76.8)
NEUTROS ABS: 3 10*3/uL (ref 1.5–6.5)
Platelets: 104 10*3/uL — ABNORMAL LOW (ref 145–400)
RBC: 3.48 10*6/uL — ABNORMAL LOW (ref 3.70–5.45)
RDW: 19.9 % — ABNORMAL HIGH (ref 11.2–14.5)
WBC: 4.5 10*3/uL (ref 3.9–10.3)

## 2014-10-24 LAB — COMPREHENSIVE METABOLIC PANEL (CC13)
ALBUMIN: 3.3 g/dL — AB (ref 3.5–5.0)
ALT: 21 U/L (ref 0–55)
AST: 24 U/L (ref 5–34)
Alkaline Phosphatase: 226 U/L — ABNORMAL HIGH (ref 40–150)
Anion Gap: 9 mEq/L (ref 3–11)
BUN: 24.7 mg/dL (ref 7.0–26.0)
CALCIUM: 8.9 mg/dL (ref 8.4–10.4)
CHLORIDE: 109 meq/L (ref 98–109)
CO2: 22 mEq/L (ref 22–29)
CREATININE: 1.6 mg/dL — AB (ref 0.6–1.1)
Glucose: 219 mg/dl — ABNORMAL HIGH (ref 70–140)
Potassium: 4.1 mEq/L (ref 3.5–5.1)
Sodium: 141 mEq/L (ref 136–145)
Total Bilirubin: 0.48 mg/dL (ref 0.20–1.20)
Total Protein: 6.4 g/dL (ref 6.4–8.3)

## 2014-10-24 LAB — MAGNESIUM (CC13): MAGNESIUM: 1.9 mg/dL (ref 1.5–2.5)

## 2014-10-30 ENCOUNTER — Telehealth: Payer: Self-pay | Admitting: *Deleted

## 2014-10-30 NOTE — Telephone Encounter (Signed)
Received message from pt asking "I have a cold and sinus congestion; should I go to my regular physician or see Dr. [name]?"  Per Dr. [name]; instructed pt to call her PCP for evaluation of cold symptoms.  Pt verbalized understanding and expressed appreciation for call back.

## 2014-11-08 ENCOUNTER — Telehealth: Payer: Self-pay | Admitting: Oncology

## 2014-11-08 NOTE — Telephone Encounter (Signed)
11/06/14 - S/w pt to advise of appt chg from 12/14 (md pal) to 12/17. Pt stated she has an appt with Dr. Florene Glen at [facility] on 12/18 and doesn't think she needs to see Dr. Ammie Dalton and Dr. Florene Glen that close together. Msg sent to Dr. [name].  11/08/14 - S/w pt re: 12/17 appt, advised per Dr. [name] ok to come back 2nd wk in Jan with MD or APP. Gave pt appt for 12/18/14 @ 1.15pm and adivsed pt to have Dr. Abel Presto office send and labs results to [place] from 12/18 appt. Pt verbalized understanding.

## 2014-11-20 ENCOUNTER — Other Ambulatory Visit: Payer: Commercial Managed Care - HMO

## 2014-11-20 ENCOUNTER — Ambulatory Visit: Payer: Commercial Managed Care - HMO | Admitting: Oncology

## 2014-11-23 ENCOUNTER — Other Ambulatory Visit: Payer: Commercial Managed Care - HMO

## 2014-11-23 ENCOUNTER — Ambulatory Visit: Payer: Commercial Managed Care - HMO | Admitting: Oncology

## 2014-12-18 ENCOUNTER — Ambulatory Visit (HOSPITAL_BASED_OUTPATIENT_CLINIC_OR_DEPARTMENT_OTHER): Payer: Commercial Managed Care - HMO | Admitting: Nurse Practitioner

## 2014-12-18 ENCOUNTER — Other Ambulatory Visit (HOSPITAL_BASED_OUTPATIENT_CLINIC_OR_DEPARTMENT_OTHER): Payer: Commercial Managed Care - HMO

## 2014-12-18 ENCOUNTER — Telehealth: Payer: Self-pay | Admitting: Nurse Practitioner

## 2014-12-18 ENCOUNTER — Other Ambulatory Visit: Payer: Commercial Managed Care - HMO

## 2014-12-18 VITALS — BP 163/89 | HR 87 | Temp 98.5°F | Resp 18 | Ht 62.0 in | Wt 161.3 lb

## 2014-12-18 DIAGNOSIS — D61810 Antineoplastic chemotherapy induced pancytopenia: Secondary | ICD-10-CM

## 2014-12-18 DIAGNOSIS — C9200 Acute myeloblastic leukemia, not having achieved remission: Secondary | ICD-10-CM

## 2014-12-18 DIAGNOSIS — C9500 Acute leukemia of unspecified cell type not having achieved remission: Secondary | ICD-10-CM

## 2014-12-18 LAB — COMPREHENSIVE METABOLIC PANEL (CC13)
ALT: 11 U/L (ref 0–55)
ANION GAP: 9 meq/L (ref 3–11)
AST: 15 U/L (ref 5–34)
Albumin: 3.6 g/dL (ref 3.5–5.0)
Alkaline Phosphatase: 163 U/L — ABNORMAL HIGH (ref 40–150)
BILIRUBIN TOTAL: 0.42 mg/dL (ref 0.20–1.20)
BUN: 27.4 mg/dL — ABNORMAL HIGH (ref 7.0–26.0)
CO2: 26 meq/L (ref 22–29)
CREATININE: 1.5 mg/dL — AB (ref 0.6–1.1)
Calcium: 8.9 mg/dL (ref 8.4–10.4)
Chloride: 108 mEq/L (ref 98–109)
EGFR: 35 mL/min/{1.73_m2} — ABNORMAL LOW (ref >90)
GLUCOSE: 113 mg/dL (ref 70–140)
POTASSIUM: 4.2 meq/L (ref 3.5–5.1)
Sodium: 143 mEq/L (ref 136–145)
Total Protein: 6.4 g/dL (ref 6.4–8.3)

## 2014-12-18 LAB — CBC WITH DIFFERENTIAL/PLATELET
BASO%: 0 % (ref 0.0–2.0)
BASOS ABS: 0 10*3/uL (ref 0.0–0.1)
EOS%: 1.5 % (ref 0.0–7.0)
Eosinophils Absolute: 0.1 10*3/uL (ref 0.0–0.5)
HCT: 33.8 % — ABNORMAL LOW (ref 34.8–46.6)
HGB: 11.4 g/dL — ABNORMAL LOW (ref 11.6–15.9)
LYMPH#: 0.8 10*3/uL — AB (ref 0.9–3.3)
LYMPH%: 24.1 % (ref 14.0–49.7)
MCH: 32.1 pg (ref 25.1–34.0)
MCHC: 33.7 g/dL (ref 31.5–36.0)
MCV: 95.2 fL (ref 79.5–101.0)
MONO#: 0.4 10*3/uL (ref 0.1–0.9)
MONO%: 11.1 % (ref 0.0–14.0)
NEUT%: 63.3 % (ref 38.4–76.8)
NEUTROS ABS: 2 10*3/uL (ref 1.5–6.5)
Platelets: 103 10*3/uL — ABNORMAL LOW (ref 145–400)
RBC: 3.55 10*6/uL — ABNORMAL LOW (ref 3.70–5.45)
RDW: 12.4 % (ref 11.2–14.5)
WBC: 3.2 10*3/uL — AB (ref 3.9–10.3)

## 2014-12-18 LAB — MAGNESIUM (CC13): Magnesium: 2.2 mg/dl (ref 1.5–2.5)

## 2014-12-18 NOTE — Telephone Encounter (Signed)
Pt confirmed labs/ov per 01/11 POF, gave pt AVS..... KJ  °

## 2014-12-18 NOTE — Progress Notes (Signed)
  [facility] OFFICE PROGRESS NOTE   Diagnosis:  AML  INTERVAL HISTORY:   [name] returns as scheduled. She completed the third consolidation cycle of high-dose cytarabine beginning 08/22/2014. End of treatment bone marrow on 09/15/2014 was necrotic with no viable evidence of leukemia.  She feels well. No fevers or sweats. She reports being diagnosed with shingles at the right back in October of this year. Otherwise she has felt well. She has a good appetite and good energy level. She is gaining weight. She continues to note easy bruising over the forearms. She has occasional mild nausea. No vomiting. Bowels moving regular. No shortness of breath or cough. No hematuria or dysuria. She denies pain.  Objective:  Vital signs in last 24 hours:  Blood pressure 163/89, pulse 87, temperature 98.5 F (36.9 C), temperature source Oral, resp. rate 18, height 5\' 2"  (1.575 m), weight 161 lb 4.8 oz (73.165 kg), SpO2 100 %.    HEENT: No thrush or ulcers. Resp: Lungs clear bilaterally. Cardio: Regular rate and rhythm. GI: Abdomen soft and nontender. No organomegaly. Vascular: No leg edema. Calves soft and nontender. Neuro: Alert and oriented.  Skin: A few ecchymoses scattered over the forearms.  Port-A-Cath without erythema.   Lab Results:  Lab Results  Component Value Date   WBC 3.2* 12/18/2014   HGB 11.4* 12/18/2014   HCT 33.8* 12/18/2014   MCV 95.2 12/18/2014   PLT 103* 12/18/2014   NEUTROABS 2.0 12/18/2014    Imaging:  No results found.  Medications: I have reviewed the patient's current medications.  Assessment/Plan: 1. Acute myelogenous leukemia, monocytic differentiation diagnosed in March of 2015   Induction cytarabine/daunorubicin (7+3) followed by reinduction with cytarabine/daunorubicin and zinecard (5+2) with a recovering bone marrow 05/16/2014 consistent with remission   status post cycle 1 high-dose araC consolidation 05/30/2014   Status post  cycle 2 high-dose araC consolidation 07/11/2014.  Status post cycle 3 high-dose araC consolidation 08/22/2014.  Bone marrow 09/15/2014 showed necrotic marrow with minute focus of myeloid precursors. No evidence of viable acute leukemia. 2. Pancytopenia secondary to chemotherapy. Improved.  3. C. difficile colitis-06/09/2014 treated with Flagyl  4. Diabetes  5. renal insufficiency  6. history of hypokalemia and hypomagnesemia. She continues potassium and magnesium supplements.  7. history of atrial fibrillation  9. history of coronary artery disease  10. Hospitalization with febrile neutropenia/sepsis secondary to Escherichia coli bacteremia 09/02/2014. 11. Shingles involving the right back October 2015.   Disposition: [name] appears stable. Platelet count is stable to improved as compared to labs done at [facility] 12/06/2014. She has a follow-up visit with Dr. Florene Glen 01/12/2015. We scheduled a return visit here in 8 weeks. She will contact the office in the interim with any problems.  Plan reviewed with Dr. [name].    Ned Card ANP/GNP-BC   12/18/2014  2:23 PM

## 2015-01-12 ENCOUNTER — Telehealth: Payer: Self-pay | Admitting: Oncology

## 2015-01-12 ENCOUNTER — Other Ambulatory Visit: Payer: Self-pay | Admitting: *Deleted

## 2015-01-12 DIAGNOSIS — C9200 Acute myeloblastic leukemia, not having achieved remission: Secondary | ICD-10-CM

## 2015-01-12 DIAGNOSIS — C9500 Acute leukemia of unspecified cell type not having achieved remission: Secondary | ICD-10-CM

## 2015-01-12 DIAGNOSIS — C9201 Acute myeloblastic leukemia, in remission: Secondary | ICD-10-CM | POA: Diagnosis not present

## 2015-01-12 NOTE — Telephone Encounter (Addendum)
Patient confirmed appt for 03/21. Mailed calendar. Patient req all on the same day.

## 2015-01-16 ENCOUNTER — Other Ambulatory Visit: Payer: Self-pay | Admitting: *Deleted

## 2015-02-12 ENCOUNTER — Ambulatory Visit: Payer: Commercial Managed Care - HMO | Admitting: Oncology

## 2015-02-12 ENCOUNTER — Other Ambulatory Visit: Payer: Commercial Managed Care - HMO

## 2015-02-19 ENCOUNTER — Telehealth: Payer: Self-pay | Admitting: Oncology

## 2015-02-19 ENCOUNTER — Telehealth: Payer: Self-pay | Admitting: Internal Medicine

## 2015-02-19 MED ORDER — METFORMIN HCL 1000 MG PO TABS
1000.0000 mg | ORAL_TABLET | Freq: Two times a day (BID) | ORAL | Status: DC
Start: 2015-02-19 — End: 2015-04-17

## 2015-02-19 NOTE — Telephone Encounter (Signed)
Pt request refill of the following: metFORMIN (GLUCOPHAGE) 1000 MG tablet   Phamacy: [facility]

## 2015-02-19 NOTE — Telephone Encounter (Signed)
Pt lft msg to confirm schedule, lft msg for pt confirming labs/ov .Marland Kitchen... KJ

## 2015-02-19 NOTE — Telephone Encounter (Signed)
Pt notified Rx sent to pharmacy

## 2015-02-23 ENCOUNTER — Other Ambulatory Visit: Payer: Medicare HMO

## 2015-02-26 ENCOUNTER — Ambulatory Visit (HOSPITAL_BASED_OUTPATIENT_CLINIC_OR_DEPARTMENT_OTHER): Payer: Commercial Managed Care - HMO

## 2015-02-26 ENCOUNTER — Ambulatory Visit (HOSPITAL_BASED_OUTPATIENT_CLINIC_OR_DEPARTMENT_OTHER): Payer: Commercial Managed Care - HMO | Admitting: Oncology

## 2015-02-26 ENCOUNTER — Telehealth: Payer: Self-pay | Admitting: Oncology

## 2015-02-26 ENCOUNTER — Other Ambulatory Visit (HOSPITAL_BASED_OUTPATIENT_CLINIC_OR_DEPARTMENT_OTHER): Payer: Commercial Managed Care - HMO

## 2015-02-26 VITALS — BP 187/62 | HR 71 | Temp 98.2°F | Resp 18 | Ht 62.0 in | Wt 172.5 lb

## 2015-02-26 DIAGNOSIS — C9201 Acute myeloblastic leukemia, in remission: Secondary | ICD-10-CM | POA: Diagnosis not present

## 2015-02-26 DIAGNOSIS — C9200 Acute myeloblastic leukemia, not having achieved remission: Secondary | ICD-10-CM

## 2015-02-26 DIAGNOSIS — D61810 Antineoplastic chemotherapy induced pancytopenia: Secondary | ICD-10-CM | POA: Diagnosis not present

## 2015-02-26 DIAGNOSIS — N289 Disorder of kidney and ureter, unspecified: Secondary | ICD-10-CM | POA: Diagnosis not present

## 2015-02-26 DIAGNOSIS — C9500 Acute leukemia of unspecified cell type not having achieved remission: Secondary | ICD-10-CM

## 2015-02-26 DIAGNOSIS — Z452 Encounter for adjustment and management of vascular access device: Secondary | ICD-10-CM | POA: Diagnosis not present

## 2015-02-26 DIAGNOSIS — Z95828 Presence of other vascular implants and grafts: Secondary | ICD-10-CM

## 2015-02-26 LAB — CBC WITH DIFFERENTIAL/PLATELET
BASO%: 0.4 % (ref 0.0–2.0)
Basophils Absolute: 0 10*3/uL (ref 0.0–0.1)
EOS%: 2.7 % (ref 0.0–7.0)
Eosinophils Absolute: 0.2 10*3/uL (ref 0.0–0.5)
HCT: 34.8 % (ref 34.8–46.6)
HGB: 11.4 g/dL — ABNORMAL LOW (ref 11.6–15.9)
LYMPH%: 21.7 % (ref 14.0–49.7)
MCH: 31.3 pg (ref 25.1–34.0)
MCHC: 32.8 g/dL (ref 31.5–36.0)
MCV: 95.6 fL (ref 79.5–101.0)
MONO#: 0.6 10*3/uL (ref 0.1–0.9)
MONO%: 10.1 % (ref 0.0–14.0)
NEUT#: 3.8 10*3/uL (ref 1.5–6.5)
NEUT%: 65.1 % (ref 38.4–76.8)
PLATELETS: 119 10*3/uL — AB (ref 145–400)
RBC: 3.63 10*6/uL — AB (ref 3.70–5.45)
RDW: 12.8 % (ref 11.2–14.5)
WBC: 5.8 10*3/uL (ref 3.9–10.3)
lymph#: 1.3 10*3/uL (ref 0.9–3.3)

## 2015-02-26 LAB — COMPREHENSIVE METABOLIC PANEL (CC13)
ALK PHOS: 130 U/L (ref 40–150)
ALT: 17 U/L (ref 0–55)
AST: 17 U/L (ref 5–34)
Albumin: 3.7 g/dL (ref 3.5–5.0)
Anion Gap: 11 mEq/L (ref 3–11)
BUN: 37.7 mg/dL — AB (ref 7.0–26.0)
CALCIUM: 9.3 mg/dL (ref 8.4–10.4)
CHLORIDE: 109 meq/L (ref 98–109)
CO2: 20 mEq/L — ABNORMAL LOW (ref 22–29)
Creatinine: 1.5 mg/dL — ABNORMAL HIGH (ref 0.6–1.1)
EGFR: 33 mL/min/{1.73_m2} — ABNORMAL LOW (ref >90)
GLUCOSE: 134 mg/dL (ref 70–140)
Potassium: 5.2 mEq/L — ABNORMAL HIGH (ref 3.5–5.1)
Sodium: 140 mEq/L (ref 136–145)
Total Bilirubin: 0.46 mg/dL (ref 0.20–1.20)
Total Protein: 6.7 g/dL (ref 6.4–8.3)

## 2015-02-26 LAB — MAGNESIUM (CC13): Magnesium: 2 mg/dl (ref 1.5–2.5)

## 2015-02-26 MED ORDER — HEPARIN SOD (PORK) LOCK FLUSH 100 UNIT/ML IV SOLN
500.0000 [IU] | Freq: Once | INTRAVENOUS | Status: AC
  Administered 2015-02-26: 500 [IU] via INTRAVENOUS
  Filled 2015-02-26: qty 5

## 2015-02-26 MED ORDER — SODIUM CHLORIDE 0.9 % IJ SOLN
10.0000 mL | INTRAMUSCULAR | Status: DC | PRN
  Administered 2015-02-26: 10 mL via INTRAVENOUS
  Filled 2015-02-26: qty 10

## 2015-02-26 NOTE — Patient Instructions (Signed)

## 2015-02-26 NOTE — Telephone Encounter (Signed)
gv and printed appt sched and avs for pt for June... °

## 2015-02-26 NOTE — Progress Notes (Signed)
  [place] OFFICE PROGRESS NOTE   Diagnosis: AML  INTERVAL HISTORY:   [name] returns as scheduled. She feels well. No fever, bleeding, or recent infection. No specific complaint.  Objective:  Vital signs in last 24 hours:  Blood pressure 187/62, pulse 71, temperature 98.2 F (36.8 C), temperature source Oral, resp. rate 18, height 5\' 2"  (1.575 m), weight 172 lb 8 oz (78.245 kg), SpO2 99 %.    Resp: Mild wheeze at the right upper posterior chest, no respiratory distress Cardio: Distant heart sounds, regular rhythm with premature beats GI: No hepatomegaly, nontender Vascular: No leg edema   Portacath/PICC-without erythema  Lab Results:  Lab Results  Component Value Date   WBC 5.8 02/26/2015   HGB 11.4* 02/26/2015   HCT 34.8 02/26/2015   MCV 95.6 02/26/2015   PLT 119* 02/26/2015   NEUTROABS 3.8 02/26/2015    Medications: I have reviewed the patient's current medications.  Assessment/Plan: 1. 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  Status post cycle 3 high-dose araC consolidation 08/22/2014.  Bone marrow 09/15/2014 showed necrotic marrow with minute focus of myeloid precursors. No evidence of viable acute leukemia. 2. Pancytopenia secondary to chemotherapy. Improved.  3. C. difficile colitis-06/09/2014 treated with Flagyl  4. Diabetes  5. renal insufficiency  6. history of hypokalemia and hypomagnesemia. 7. history of atrial fibrillation  9. history of coronary artery disease  10. Hospitalization with febrile neutropenia/sepsis secondary to Escherichia coli bacteremia 09/02/2014. 11.  Shingles involving the right back October 2015.  Disposition:  [name] appears well. She remains in clinical remission from AML. She is scheduled for appointment at [facility] in approximately 6 weeks. She will return for an office visit and Port-A-Cath flush in 3 months.  Betsy Coder, MD  02/26/2015  10:39 AM

## 2015-02-27 ENCOUNTER — Telehealth: Payer: Self-pay | Admitting: *Deleted

## 2015-02-27 NOTE — Telephone Encounter (Signed)
-----   Message from Ladell Pier, MD sent at 02/26/2015  6:27 PM EDT ----- Please call patient, stop potassium

## 2015-02-27 NOTE — Telephone Encounter (Signed)
Called pt to confirm she is not taking potassium. She reports having stopped it months ago. Continues magnesium supplement. Per Dr. [name]: OK to stop magnesium.

## 2015-03-14 ENCOUNTER — Encounter: Payer: Self-pay | Admitting: Internal Medicine

## 2015-04-05 ENCOUNTER — Ambulatory Visit: Payer: PRIVATE HEALTH INSURANCE | Admitting: Internal Medicine

## 2015-04-09 DIAGNOSIS — H40013 Open angle with borderline findings, low risk, bilateral: Secondary | ICD-10-CM | POA: Diagnosis not present

## 2015-04-09 DIAGNOSIS — Z9841 Cataract extraction status, right eye: Secondary | ICD-10-CM | POA: Diagnosis not present

## 2015-04-09 DIAGNOSIS — H52223 Regular astigmatism, bilateral: Secondary | ICD-10-CM | POA: Diagnosis not present

## 2015-04-09 DIAGNOSIS — H18413 Arcus senilis, bilateral: Secondary | ICD-10-CM | POA: Diagnosis not present

## 2015-04-09 DIAGNOSIS — H11153 Pinguecula, bilateral: Secondary | ICD-10-CM | POA: Diagnosis not present

## 2015-04-09 DIAGNOSIS — E119 Type 2 diabetes mellitus without complications: Secondary | ICD-10-CM | POA: Diagnosis not present

## 2015-04-09 DIAGNOSIS — H5203 Hypermetropia, bilateral: Secondary | ICD-10-CM | POA: Diagnosis not present

## 2015-04-09 DIAGNOSIS — H524 Presbyopia: Secondary | ICD-10-CM | POA: Diagnosis not present

## 2015-04-09 DIAGNOSIS — Z9842 Cataract extraction status, left eye: Secondary | ICD-10-CM | POA: Diagnosis not present

## 2015-04-10 ENCOUNTER — Telehealth: Payer: Self-pay | Admitting: *Deleted

## 2015-04-10 NOTE — Telephone Encounter (Signed)
PT. SAW DR.[place] ON 02/26/15. [name] DID NOT HAVE A REFERRAL FOR THIS VISIT AND IS RESPONSIBLE FOR THE BILL. PT.'S PRIMARY CARE PHYSICIAN'S OFFICE SAID THEY SENT THE REFERRAL. SPOKE TO [place]. THE PCP'S OFFICE SHOULD HAVE SENT THE REFERRAL TO [employer] WHO WOULD HAVE SENT THE PCP'S OFFICE THE APPROVAL FOR THE 02/26/15 VISIT WITH DR.SHERRILL. LEFT PT. A MESSAGE TO RETURN A CALL.

## 2015-04-13 DIAGNOSIS — C9200 Acute myeloblastic leukemia, not having achieved remission: Secondary | ICD-10-CM | POA: Diagnosis not present

## 2015-04-13 DIAGNOSIS — Z9221 Personal history of antineoplastic chemotherapy: Secondary | ICD-10-CM | POA: Diagnosis not present

## 2015-04-13 DIAGNOSIS — C9201 Acute myeloblastic leukemia, in remission: Secondary | ICD-10-CM | POA: Diagnosis not present

## 2015-04-13 DIAGNOSIS — Z7982 Long term (current) use of aspirin: Secondary | ICD-10-CM | POA: Diagnosis not present

## 2015-04-17 ENCOUNTER — Ambulatory Visit (INDEPENDENT_AMBULATORY_CARE_PROVIDER_SITE_OTHER): Payer: Commercial Managed Care - HMO | Admitting: Internal Medicine

## 2015-04-17 ENCOUNTER — Other Ambulatory Visit: Payer: Self-pay | Admitting: *Deleted

## 2015-04-17 ENCOUNTER — Encounter: Payer: Self-pay | Admitting: Internal Medicine

## 2015-04-17 VITALS — BP 130/70 | HR 77 | Temp 98.2°F | Resp 20 | Ht 62.0 in | Wt 171.0 lb

## 2015-04-17 DIAGNOSIS — N183 Chronic kidney disease, stage 3 unspecified: Secondary | ICD-10-CM

## 2015-04-17 DIAGNOSIS — E0821 Diabetes mellitus due to underlying condition with diabetic nephropathy: Secondary | ICD-10-CM

## 2015-04-17 DIAGNOSIS — C9200 Acute myeloblastic leukemia, not having achieved remission: Secondary | ICD-10-CM

## 2015-04-17 DIAGNOSIS — C9500 Acute leukemia of unspecified cell type not having achieved remission: Secondary | ICD-10-CM

## 2015-04-17 DIAGNOSIS — I10 Essential (primary) hypertension: Secondary | ICD-10-CM

## 2015-04-17 MED ORDER — HYDROCODONE-ACETAMINOPHEN 5-325 MG PO TABS: 1.0000 | ORAL_TABLET | Freq: Four times a day (QID) | ORAL | Status: DC | PRN

## 2015-04-17 MED ORDER — SIMVASTATIN 20 MG PO TABS: 20.0000 mg | ORAL_TABLET | Freq: Every evening | ORAL | Status: DC

## 2015-04-17 MED ORDER — METOPROLOL TARTRATE 50 MG PO TABS: 50.0000 mg | ORAL_TABLET | Freq: Two times a day (BID) | ORAL | Status: DC

## 2015-04-17 MED ORDER — GLIMEPIRIDE 4 MG PO TABS: 4.0000 mg | ORAL_TABLET | Freq: Every day | ORAL | Status: DC

## 2015-04-17 MED ORDER — LORAZEPAM 0.5 MG PO TABS: 0.5000 mg | ORAL_TABLET | Freq: Four times a day (QID) | ORAL | Status: DC | PRN

## 2015-04-17 MED ORDER — METFORMIN HCL 1000 MG PO TABS: 1000.0000 mg | ORAL_TABLET | Freq: Two times a day (BID) | ORAL | Status: DC

## 2015-04-17 MED ORDER — AMLODIPINE BESYLATE 5 MG PO TABS: 5.0000 mg | ORAL_TABLET | Freq: Every day | ORAL | Status: DC

## 2015-04-17 MED ORDER — FUROSEMIDE 40 MG PO TABS: 40.0000 mg | ORAL_TABLET | ORAL | Status: DC | PRN

## 2015-04-17 NOTE — Patient Instructions (Signed)
Limit your sodium (Salt) intake   Please check your hemoglobin A1c every 3 months    It is important that you exercise regularly, at least 20 minutes 3 to 4 times per week.  If you develop chest pain or shortness of breath seek  medical attention.  You need to lose weight.  Consider a lower calorie diet and regular exercise. 

## 2015-04-17 NOTE — Progress Notes (Signed)
Subjective:    Patient ID: [name], female    DOB: 19-Jul-1941, 74 y.o.   MRN: [id number]  HPI 74 year old patient who is seen today for follow-up.  She is followed closely by oncology, both locally and at [facility] due to AML, which presently is in remission.  She has a history of diabetes, dyslipidemia and hypertension. She has chronic low back pain and has been on narcotic analgesics in the past.  She has chronic kidney disease. She has had a recent eye examination. Recent laboratory studies revealed a creatinine of 1.69.  Past Medical History  Diagnosis Date  . DIABETES MELLITUS, TYPE II 07/13/2007  . HYPERLIPIDEMIA 12/24/2007  . HYPERTENSION 07/13/2007  . CORONARY ARTERY DISEASE 12/24/2007  . NEPHROLITHIASIS, HX OF 12/24/2007  . Colon polyps 04/29/2010    TUBULAR ADENOMA AND A SERRATED ADENOMA  . Acute pancreatitis   . Unspecified disease of pancreas   . Chronic kidney disease, stage II (mild)     History   Social History  . Marital Status: Married    Spouse Name: N/A  . Number of Children: N/A  . Years of Education: N/A   Occupational History  . Not on file.   Social History Main Topics  . Smoking status: Former Smoker    Types: Cigarettes    Quit date: 12/08/2000  . Smokeless tobacco: Never Used  . Alcohol Use: No  . Drug Use: No  . Sexual Activity: Not on file   Other Topics Concern  . Not on file   Social History Narrative    Past Surgical History  Procedure Laterality Date  . Excisional hemorrhoidectomy  unknown  . Abdominal hysterectomy  unknown  . Nasal sinus surgery  unknown  . Cardiac catheterization  2002  . Cataract extraction Bilateral     Family History  Problem Relation Age of Onset  . Cancer Father     throat ca  . COPD Father   . Cancer Sister     uterine ca and breast ca  . Cancer Brother     throat    No Known Allergies  Current Outpatient Prescriptions on File Prior to Visit  Medication Sig Dispense Refill  . aspirin  EC 325 MG tablet Take 325 mg by mouth daily.    . magnesium oxide (MAG-OX) 400 (241.3 MG) MG tablet Take 2 tablets (800 mg total) by mouth 2 (two) times daily. (Patient taking differently: Take 800 mg by mouth daily. ) 60 tablet 0  . nitroGLYCERIN (NITROSTAT) 0.4 MG SL tablet Place 1 tablet (0.4 mg total) under the tongue every 5 (five) minutes as needed for chest pain. 60 tablet 5  . polyethylene glycol (MIRALAX / GLYCOLAX) packet Take 17 g by mouth daily as needed for moderate constipation. 14 each 0  . promethazine (PHENERGAN) 12.5 MG tablet Take 2 tablets (25 mg total) by mouth every 6 (six) hours as needed for nausea. 12 tablet 0  . senna-docusate (SENOKOT-S) 8.6-50 MG per tablet Take 1 tablet by mouth 2 (two) times daily as needed.     Current Facility-Administered Medications on File Prior to Visit  Medication Dose Route Frequency Provider Last Rate Last Dose  . sodium chloride 0.9 % 1,000 mL with magnesium sulfate 2 g infusion   Intravenous Continuous Ladell Pier, MD   Stopped at 08/04/14 1441    BP 130/70 mmHg  Pulse 77  Temp(Src) 98.2 F (36.8 C) (Oral)  Resp 20  Ht 5\' 2"  (1.575 m)  Wt 171 lb (77.565 kg)  BMI 31.27 kg/m2  SpO2 98%     Review of Systems  Constitutional: Negative.   HENT: Negative for congestion, dental problem, hearing loss, rhinorrhea, sinus pressure, sore throat and tinnitus.   Eyes: Negative for pain, discharge and visual disturbance.  Respiratory: Negative for cough and shortness of breath.   Cardiovascular: Negative for chest pain, palpitations and leg swelling.  Gastrointestinal: Negative for nausea, vomiting, abdominal pain, diarrhea, constipation, blood in stool and abdominal distention.  Genitourinary: Negative for dysuria, urgency, frequency, hematuria, flank pain, vaginal bleeding, vaginal discharge, difficulty urinating, vaginal pain and pelvic pain.  Musculoskeletal: Positive for back pain and arthralgias. Negative for joint swelling and  gait problem.  Skin: Negative for rash.  Neurological: Negative for dizziness, syncope, speech difficulty, weakness, numbness and headaches.  Hematological: Negative for adenopathy.  Psychiatric/Behavioral: Negative for behavioral problems, dysphoric mood and agitation. The patient is not nervous/anxious.        Objective:   Physical Exam  Constitutional: She is oriented to person, place, and time. She appears well-developed and well-nourished.  HENT:  Head: Normocephalic.  Right Ear: External ear normal.  Left Ear: External ear normal.  Mouth/Throat: Oropharynx is clear and moist.  Eyes: Conjunctivae and EOM are normal. Pupils are equal, round, and reactive to light.  Neck: Normal range of motion. Neck supple. No thyromegaly present.  Diffuse bruits in the carotid and supraclavicular regions hemoglobin A1cn, stable Cardiovascular: Normal rate, regular rhythm and intact distal pulses.   Murmur heard. Right dorsalis pedis pulse diminished  Pulmonary/Chest: Effort normal and breath sounds normal.  Abdominal: Soft. Bowel sounds are normal. She exhibits no mass. There is no tenderness.  Musculoskeletal: Normal range of motion.  Lymphadenopathy:    She has no cervical adenopathy.  Neurological: She is alert and oriented to person, place, and time.  Skin: Skin is warm and dry. No rash noted.  Psychiatric: She has a normal mood and affect. Her behavior is normal.          Assessment & Plan:   Diabetes mellitus.  Will check a hemoglobin A1c and urine for microalbumin Hypertension, controlled AML in remission.  Follow-up oncology Chronic low back pain.  Vicodin refilled  Recheck 3 months All medications updated

## 2015-04-17 NOTE — Progress Notes (Signed)
Pre visit review using our clinic review tool, if applicable. No additional management support is needed unless otherwise documented below in the visit note. 

## 2015-04-18 ENCOUNTER — Other Ambulatory Visit: Payer: Self-pay | Admitting: *Deleted

## 2015-04-18 LAB — HEMOGLOBIN A1C: Hgb A1c MFr Bld: 7.2 % — ABNORMAL HIGH (ref 4.6–6.5)

## 2015-04-18 MED ORDER — FUROSEMIDE 40 MG PO TABS: 40.0000 mg | ORAL_TABLET | Freq: Every day | ORAL | Status: DC

## 2015-04-23 ENCOUNTER — Telehealth: Payer: Self-pay | Admitting: Internal Medicine

## 2015-04-23 NOTE — Telephone Encounter (Signed)
Spoke to pt, told her all her medicines were refilled and sent to [facility] when you were in. Pt said she knows she called [employer] and they are sending them out. Told pt okay.

## 2015-04-23 NOTE — Telephone Encounter (Signed)
Pt request refill of the following: BENAZEPRIL,  simvastatin (ZOCOR) 20 MG tablet  metFORMIN (GLUCOPHAGE) 1000 MG tablet,  metoprolol (LOPRESSOR) 50 MG tablet   glimepiride (AMARYL) 4 MG tablet  Pt said she did not receive any of the above medicines      Phamacy: [place]

## 2015-04-25 ENCOUNTER — Telehealth: Payer: Self-pay | Admitting: Internal Medicine

## 2015-04-25 MED ORDER — NITROGLYCERIN 0.4 MG SL SUBL: 0.4000 mg | SUBLINGUAL_TABLET | SUBLINGUAL | Status: DC | PRN

## 2015-04-25 NOTE — Telephone Encounter (Signed)
Pt needs refill on nitroglycerin send to [facility] order

## 2015-04-25 NOTE — Telephone Encounter (Signed)
Pt notified Rx sent to [facility].

## 2015-05-17 ENCOUNTER — Telehealth: Payer: Self-pay | Admitting: Oncology

## 2015-05-17 NOTE — Telephone Encounter (Signed)
Pt called lft msg stating she doesn't have an apt for the 13th of June, called pt back lft msg confirming labs/flush/ov on 06/13 per 03/21 POF and labs/flush on 06/17 per 05/10 POF..... KJ

## 2015-05-18 NOTE — Telephone Encounter (Signed)
S/w pt confirming labs/ov/inj all moved to 06/17 .... [name]

## 2015-05-21 ENCOUNTER — Ambulatory Visit: Payer: Medicare HMO | Admitting: Nurse Practitioner

## 2015-05-21 ENCOUNTER — Other Ambulatory Visit: Payer: Medicare HMO

## 2015-05-24 ENCOUNTER — Telehealth: Payer: Self-pay | Admitting: Nurse Practitioner

## 2015-05-24 ENCOUNTER — Other Ambulatory Visit (HOSPITAL_BASED_OUTPATIENT_CLINIC_OR_DEPARTMENT_OTHER): Payer: Commercial Managed Care - HMO

## 2015-05-24 ENCOUNTER — Telehealth: Payer: Self-pay | Admitting: Oncology

## 2015-05-24 ENCOUNTER — Ambulatory Visit (HOSPITAL_BASED_OUTPATIENT_CLINIC_OR_DEPARTMENT_OTHER): Payer: Commercial Managed Care - HMO

## 2015-05-24 ENCOUNTER — Ambulatory Visit (HOSPITAL_BASED_OUTPATIENT_CLINIC_OR_DEPARTMENT_OTHER): Payer: Commercial Managed Care - HMO | Admitting: Nurse Practitioner

## 2015-05-24 VITALS — BP 150/84 | HR 70 | Temp 99.0°F | Resp 18 | Ht 62.0 in | Wt 189.6 lb

## 2015-05-24 DIAGNOSIS — D61810 Antineoplastic chemotherapy induced pancytopenia: Secondary | ICD-10-CM

## 2015-05-24 DIAGNOSIS — N289 Disorder of kidney and ureter, unspecified: Secondary | ICD-10-CM

## 2015-05-24 DIAGNOSIS — E119 Type 2 diabetes mellitus without complications: Secondary | ICD-10-CM

## 2015-05-24 DIAGNOSIS — C9201 Acute myeloblastic leukemia, in remission: Secondary | ICD-10-CM

## 2015-05-24 DIAGNOSIS — C9200 Acute myeloblastic leukemia, not having achieved remission: Secondary | ICD-10-CM

## 2015-05-24 DIAGNOSIS — Z452 Encounter for adjustment and management of vascular access device: Secondary | ICD-10-CM

## 2015-05-24 DIAGNOSIS — C9500 Acute leukemia of unspecified cell type not having achieved remission: Secondary | ICD-10-CM

## 2015-05-24 DIAGNOSIS — D329 Benign neoplasm of meninges, unspecified: Secondary | ICD-10-CM

## 2015-05-24 DIAGNOSIS — Z95828 Presence of other vascular implants and grafts: Secondary | ICD-10-CM

## 2015-05-24 LAB — CBC WITH DIFFERENTIAL/PLATELET
BASO%: 0.2 % (ref 0.0–2.0)
Basophils Absolute: 0 10*3/uL (ref 0.0–0.1)
EOS%: 4 % (ref 0.0–7.0)
Eosinophils Absolute: 0.3 10*3/uL (ref 0.0–0.5)
HEMATOCRIT: 32.4 % — AB (ref 34.8–46.6)
HEMOGLOBIN: 11 g/dL — AB (ref 11.6–15.9)
LYMPH#: 1.4 10*3/uL (ref 0.9–3.3)
LYMPH%: 21.4 % (ref 14.0–49.7)
MCH: 31.6 pg (ref 25.1–34.0)
MCHC: 34 g/dL (ref 31.5–36.0)
MCV: 93.1 fL (ref 79.5–101.0)
MONO#: 0.7 10*3/uL (ref 0.1–0.9)
MONO%: 10.1 % (ref 0.0–14.0)
NEUT%: 64.3 % (ref 38.4–76.8)
NEUTROS ABS: 4.2 10*3/uL (ref 1.5–6.5)
Platelets: 108 10*3/uL — ABNORMAL LOW (ref 145–400)
RBC: 3.48 10*6/uL — ABNORMAL LOW (ref 3.70–5.45)
RDW: 13.3 % (ref 11.2–14.5)
WBC: 6.5 10*3/uL (ref 3.9–10.3)

## 2015-05-24 LAB — COMPREHENSIVE METABOLIC PANEL (CC13)
ALK PHOS: 99 U/L (ref 40–150)
ALT: 20 U/L (ref 0–55)
AST: 17 U/L (ref 5–34)
Albumin: 3.6 g/dL (ref 3.5–5.0)
Anion Gap: 7 mEq/L (ref 3–11)
BUN: 37.9 mg/dL — AB (ref 7.0–26.0)
CO2: 24 mEq/L (ref 22–29)
Calcium: 9 mg/dL (ref 8.4–10.4)
Chloride: 110 mEq/L — ABNORMAL HIGH (ref 98–109)
Creatinine: 1.6 mg/dL — ABNORMAL HIGH (ref 0.6–1.1)
EGFR: 32 mL/min/{1.73_m2} — ABNORMAL LOW (ref >90)
Glucose: 135 mg/dl (ref 70–140)
POTASSIUM: 5.2 meq/L — AB (ref 3.5–5.1)
Sodium: 141 mEq/L (ref 136–145)
Total Bilirubin: 0.51 mg/dL (ref 0.20–1.20)
Total Protein: 6.7 g/dL (ref 6.4–8.3)

## 2015-05-24 LAB — MAGNESIUM (CC13): MAGNESIUM: 2.3 mg/dL (ref 1.5–2.5)

## 2015-05-24 MED ORDER — HEPARIN SOD (PORK) LOCK FLUSH 100 UNIT/ML IV SOLN
500.0000 [IU] | Freq: Once | INTRAVENOUS | Status: AC
  Administered 2015-05-24: 500 [IU] via INTRAVENOUS
  Filled 2015-05-24: qty 5

## 2015-05-24 MED ORDER — SODIUM CHLORIDE 0.9 % IJ SOLN
10.0000 mL | INTRAMUSCULAR | Status: DC | PRN
  Administered 2015-05-24: 10 mL via INTRAVENOUS
  Filled 2015-05-24: qty 10

## 2015-05-24 NOTE — Telephone Encounter (Signed)
Pt came in a day early....LT ok to see pt today all appts moved to today.

## 2015-05-24 NOTE — Progress Notes (Signed)
  [name] OFFICE PROGRESS NOTE   Diagnosis:  AML  INTERVAL HISTORY:   [name] returns as scheduled. She feels well. She has a good appetite. She is gaining weight. No interim illnesses or infections. No fevers or sweats. She denies any bleeding. She has occasional dyspnea on exertion. No cough. She has occasional nausea. No change in bowel habits. No hematuria or dysuria. No unusual headaches. No vision change. She has bilateral shoulder pain which she attributes to arthritis.  Objective:  Vital signs in last 24 hours:  Blood pressure 150/84, pulse 70, temperature 99 F (37.2 C), temperature source Oral, resp. rate 18, height 5\' 2"  (1.575 m), weight 189 lb 9.6 oz (86.002 kg), SpO2 99 %.    HEENT: No thrush or ulcers. Lymphatics: No palpable cervical, supra clavicular or axillary lymph nodes. Resp: Lungs clear bilaterally. Cardio: Regular rate and rhythm. GI: Abdomen soft and nontender. No organomegaly. Vascular: No leg edema. Calves soft and nontender. Skin: No rash. Port-A-Cath without erythema.    Lab Results:  Lab Results  Component Value Date   WBC 6.5 05/24/2015   HGB 11.0* 05/24/2015   HCT 32.4* 05/24/2015   MCV 93.1 05/24/2015   PLT 108* 05/24/2015   NEUTROABS 4.2 05/24/2015    Imaging:  No results found.  Medications: I have reviewed the patient's current medications.  Assessment/Plan: 1. 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  Status post cycle 3 high-dose araC consolidation 08/22/2014.  Bone marrow 09/15/2014 showed necrotic marrow with minute focus of myeloid precursors. No evidence of  viable acute leukemia. 2. Pancytopenia secondary to chemotherapy. Improved.  3. C. difficile colitis-06/09/2014 treated with Flagyl  4. Diabetes  5. renal insufficiency  6. history of hypokalemia and hypomagnesemia. 7. history of atrial fibrillation  9. history of coronary artery disease  10. Hospitalization with febrile neutropenia/sepsis secondary to Escherichia coli bacteremia 09/02/2014. 11. Shingles involving the right back October 2015. 12. Meningioma on brain CT 05/24/2014 and brain MRI 05/24/2014   Disposition: [name] appears stable. She remains in clinical remission from AML. She has follow-up at [facility] in approximately 6 weeks. She will return for a follow-up visit and Port-A-Cath flush here in 12 weeks. She will contact the office in the interim with any problems.  She was found to have a meningioma on brain CT and brain MRI 05/24/2014. We are referring her for a follow-up noncontrast brain CT. We will contact her with the result.  Plan reviewed with Dr. [name].    Ned Card ANP/GNP-BC   05/24/2015  4:29 PM

## 2015-05-24 NOTE — Telephone Encounter (Signed)
per pof to sch pt papt-gave pt avs

## 2015-05-24 NOTE — Progress Notes (Signed)
Patient in for labs and PAC flush. Patient has double lumen PAC. Double lumen PAC accessed and flushed without any difficulty. Blood return noted with flush of both lumens and blood obtained for labs. Patient denies any pain or discomfort at this time. Patient states, "Even though I'm feeling okay, please leave my port accessed just in case. I will have them take it out at my appointment with Ned Card after my results come back." Per patient's request, Patient's PAC left accessed and covered with a Tegaderm dressing. Truett Mainland, LPN, nurse working with Ned Card, NP, made aware that Patient's [facility] is accessed. Truett Mainland, LPN verbalized understanding.

## 2015-05-24 NOTE — Patient Instructions (Signed)

## 2015-05-25 ENCOUNTER — Ambulatory Visit: Payer: Commercial Managed Care - HMO | Admitting: Nurse Practitioner

## 2015-05-25 ENCOUNTER — Encounter: Payer: Self-pay | Admitting: Internal Medicine

## 2015-05-25 ENCOUNTER — Other Ambulatory Visit: Payer: Commercial Managed Care - HMO

## 2015-05-29 ENCOUNTER — Encounter (HOSPITAL_COMMUNITY): Payer: Self-pay

## 2015-05-29 ENCOUNTER — Ambulatory Visit (HOSPITAL_COMMUNITY)
Admission: RE | Admit: 2015-05-29 | Discharge: 2015-05-29 | Disposition: A | Discharge status: Home / Self Care | Payer: Commercial Managed Care - HMO | Source: Ambulatory Visit | Attending: Nurse Practitioner | Admitting: Nurse Practitioner

## 2015-05-29 DIAGNOSIS — D329 Benign neoplasm of meninges, unspecified: Secondary | ICD-10-CM

## 2015-05-29 DIAGNOSIS — C9200 Acute myeloblastic leukemia, not having achieved remission: Secondary | ICD-10-CM

## 2015-05-29 HISTORY — DX: Malignant (primary) neoplasm, unspecified: C80.1

## 2015-05-31 ENCOUNTER — Telehealth: Payer: Self-pay | Admitting: *Deleted

## 2015-05-31 NOTE — Telephone Encounter (Signed)
Per Dr. [name]; notified pt that meningioma is stable on CT scan, f/u as scheduled.  Pt verbalized understanding.

## 2015-05-31 NOTE — Telephone Encounter (Signed)
-----   Message from Ladell Pier, MD sent at 05/30/2015  8:27 PM EDT ----- Please call patient, meningioma is stable, f/u as scheduled

## 2015-06-22 NOTE — Patient Outreach (Signed)
[facility] [facility]) Care Management  06/22/2015  KADE DEATRICK [birth year] [id number]   Referral from [facility] Tier 4 list, assigned to Mariann Laster, RN for patient outreach.  Novah Goza L. [facility] Care Management Assistant [phone]

## 2015-06-25 ENCOUNTER — Other Ambulatory Visit: Payer: Self-pay

## 2015-06-25 NOTE — Patient Outreach (Signed)
[facility]) Care Management  06/25/2015  [name] 26-Dec-1940 [id number]  Telephonic Care Management Note  Referral Date:  06/22/15 Referral Source:  [facility] Tier 4 Referral Issue:  DM with no ED or Hospital admissions.  PCP:  Marletta Lor, MD Per Epic MR review:  H/O meningioma on brain CT and brain MRI 05/24/2014 and Acute myelogenous leukemia (AML)  Triage contact call.  Patient not reached.  HIPAA compliant voice message left with name and contact #.  RN CM rescheduled for next contact call.   Mariann Laster, RN, BSN, [facility], CCM  [employer] Management Coordinator [phone] Office [phone] Direct [phone] Cell

## 2015-06-27 ENCOUNTER — Other Ambulatory Visit: Payer: Commercial Managed Care - HMO

## 2015-06-27 NOTE — Patient Outreach (Signed)
[facility]) Care Management  06/27/2015  [name] 05-19-1941 [id number]    Telephonic Care Management Note  Referral Date: 06/22/15 Referral Source: [facility] Tier 4 Referral Issue: DM with no ED or Hospital admissions.  PCP: Marletta Lor, MD Per Epic MR review: H/O meningioma on brain CT and brain MRI 05/24/2014 and Acute myelogenous leukemia (AML)  Triage contact call #2. Patient reached.  [phone] home - left message [phone] cell -  contact made.  Patient states she is at work and does not have time to talk.  States she is doing fine and does not need anything.  Plan:  RN CM notified [place] Assistant:  Close case; patient refused services.  RN CM will send MD notification of unsuccessful outreach via letter   Mariann Laster, RN, BSN, [facility], [place] Management Care Management Coordinator [phone] Office [phone] Direct [phone] Cell

## 2015-07-03 NOTE — Patient Outreach (Signed)
[place] [facility]) Care Management  07/03/2015  [name] 03/30/1941 [id number]   Notification from Mariann Laster, RN to close case due to patient refused [place].  Ronnell Freshwater. [place], [place] Management [place] Assistant Phone: [phone] Fax: [phone]

## 2015-07-11 ENCOUNTER — Telehealth: Payer: Self-pay | Admitting: Internal Medicine

## 2015-07-11 DIAGNOSIS — C9500 Acute leukemia of unspecified cell type not having achieved remission: Secondary | ICD-10-CM

## 2015-07-11 NOTE — Telephone Encounter (Addendum)
Pt is calling back with first name of dr Florene Glen. His first name is [name]. Pt has an appt on 07-13-15. Pt has [place] ins

## 2015-07-11 NOTE — Telephone Encounter (Signed)
Pt call to say she need a referral to see Dr Florene Glen in [place] at  [place] also need a referral to see Dr [name] at [place]     Reason; pt see the doctor for  Leukemia

## 2015-07-12 NOTE — Telephone Encounter (Signed)
All ok

## 2015-07-12 NOTE — Telephone Encounter (Signed)
Referrals placed 

## 2015-07-12 NOTE — Telephone Encounter (Signed)
Okay to refer? 

## 2015-07-13 ENCOUNTER — Other Ambulatory Visit: Payer: Self-pay | Admitting: Internal Medicine

## 2015-07-13 DIAGNOSIS — E119 Type 2 diabetes mellitus without complications: Secondary | ICD-10-CM | POA: Diagnosis not present

## 2015-07-13 DIAGNOSIS — C9500 Acute leukemia of unspecified cell type not having achieved remission: Secondary | ICD-10-CM

## 2015-07-13 DIAGNOSIS — M25552 Pain in left hip: Secondary | ICD-10-CM | POA: Diagnosis not present

## 2015-07-13 DIAGNOSIS — K5900 Constipation, unspecified: Secondary | ICD-10-CM | POA: Diagnosis not present

## 2015-07-13 DIAGNOSIS — M25511 Pain in right shoulder: Secondary | ICD-10-CM | POA: Diagnosis not present

## 2015-07-13 DIAGNOSIS — N189 Chronic kidney disease, unspecified: Secondary | ICD-10-CM | POA: Diagnosis not present

## 2015-07-13 DIAGNOSIS — I4891 Unspecified atrial fibrillation: Secondary | ICD-10-CM | POA: Diagnosis not present

## 2015-07-13 DIAGNOSIS — I129 Hypertensive chronic kidney disease with stage 1 through stage 4 chronic kidney disease, or unspecified chronic kidney disease: Secondary | ICD-10-CM | POA: Diagnosis not present

## 2015-07-13 DIAGNOSIS — C9201 Acute myeloblastic leukemia, in remission: Secondary | ICD-10-CM | POA: Diagnosis not present

## 2015-07-18 ENCOUNTER — Ambulatory Visit: Payer: Commercial Managed Care - HMO | Admitting: Internal Medicine

## 2015-08-27 ENCOUNTER — Other Ambulatory Visit: Payer: Commercial Managed Care - HMO

## 2015-08-27 ENCOUNTER — Ambulatory Visit (HOSPITAL_BASED_OUTPATIENT_CLINIC_OR_DEPARTMENT_OTHER): Payer: Commercial Managed Care - HMO

## 2015-08-27 ENCOUNTER — Other Ambulatory Visit (HOSPITAL_BASED_OUTPATIENT_CLINIC_OR_DEPARTMENT_OTHER): Payer: Commercial Managed Care - HMO

## 2015-08-27 ENCOUNTER — Telehealth: Payer: Self-pay | Admitting: Oncology

## 2015-08-27 ENCOUNTER — Ambulatory Visit (HOSPITAL_BASED_OUTPATIENT_CLINIC_OR_DEPARTMENT_OTHER): Payer: Commercial Managed Care - HMO | Admitting: Oncology

## 2015-08-27 VITALS — Resp 18

## 2015-08-27 DIAGNOSIS — Z23 Encounter for immunization: Secondary | ICD-10-CM | POA: Diagnosis not present

## 2015-08-27 DIAGNOSIS — Z452 Encounter for adjustment and management of vascular access device: Secondary | ICD-10-CM

## 2015-08-27 DIAGNOSIS — D61810 Antineoplastic chemotherapy induced pancytopenia: Secondary | ICD-10-CM | POA: Diagnosis not present

## 2015-08-27 DIAGNOSIS — C9201 Acute myeloblastic leukemia, in remission: Secondary | ICD-10-CM | POA: Diagnosis not present

## 2015-08-27 DIAGNOSIS — N289 Disorder of kidney and ureter, unspecified: Secondary | ICD-10-CM

## 2015-08-27 DIAGNOSIS — E119 Type 2 diabetes mellitus without complications: Secondary | ICD-10-CM

## 2015-08-27 DIAGNOSIS — C9500 Acute leukemia of unspecified cell type not having achieved remission: Secondary | ICD-10-CM

## 2015-08-27 DIAGNOSIS — D329 Benign neoplasm of meninges, unspecified: Secondary | ICD-10-CM

## 2015-08-27 DIAGNOSIS — C9200 Acute myeloblastic leukemia, not having achieved remission: Secondary | ICD-10-CM

## 2015-08-27 LAB — CBC WITH DIFFERENTIAL/PLATELET
BASO%: 0.2 % (ref 0.0–2.0)
Basophils Absolute: 0 10*3/uL (ref 0.0–0.1)
EOS%: 2.9 % (ref 0.0–7.0)
Eosinophils Absolute: 0.2 10*3/uL (ref 0.0–0.5)
HCT: 34.2 % — ABNORMAL LOW (ref 34.8–46.6)
HEMOGLOBIN: 11.5 g/dL — AB (ref 11.6–15.9)
LYMPH%: 23.7 % (ref 14.0–49.7)
MCH: 31.2 pg (ref 25.1–34.0)
MCHC: 33.6 g/dL (ref 31.5–36.0)
MCV: 93 fL (ref 79.5–101.0)
MONO#: 0.6 10*3/uL (ref 0.1–0.9)
MONO%: 9.6 % (ref 0.0–14.0)
NEUT#: 4.1 10*3/uL (ref 1.5–6.5)
NEUT%: 63.6 % (ref 38.4–76.8)
Platelets: 135 10*3/uL — ABNORMAL LOW (ref 145–400)
RBC: 3.67 10*6/uL — AB (ref 3.70–5.45)
RDW: 13.6 % (ref 11.2–14.5)
WBC: 6.4 10*3/uL (ref 3.9–10.3)
lymph#: 1.5 10*3/uL (ref 0.9–3.3)

## 2015-08-27 LAB — COMPREHENSIVE METABOLIC PANEL (CC13)
ALK PHOS: 78 U/L (ref 40–150)
ALT: 12 U/L (ref 0–55)
AST: 13 U/L (ref 5–34)
Albumin: 3.5 g/dL (ref 3.5–5.0)
Anion Gap: 7 mEq/L (ref 3–11)
BILIRUBIN TOTAL: 0.52 mg/dL (ref 0.20–1.20)
BUN: 16.3 mg/dL (ref 7.0–26.0)
CO2: 24 meq/L (ref 22–29)
Calcium: 8.9 mg/dL (ref 8.4–10.4)
Chloride: 112 mEq/L — ABNORMAL HIGH (ref 98–109)
Creatinine: 1.4 mg/dL — ABNORMAL HIGH (ref 0.6–1.1)
EGFR: 38 mL/min/{1.73_m2} — AB (ref >90)
GLUCOSE: 157 mg/dL — AB (ref 70–140)
Potassium: 4.5 mEq/L (ref 3.5–5.1)
SODIUM: 143 meq/L (ref 136–145)
TOTAL PROTEIN: 6.3 g/dL — AB (ref 6.4–8.3)

## 2015-08-27 LAB — MAGNESIUM (CC13): Magnesium: 1.6 mg/dl (ref 1.5–2.5)

## 2015-08-27 MED ORDER — SODIUM CHLORIDE 0.9 % IJ SOLN
10.0000 mL | INTRAMUSCULAR | Status: DC | PRN
  Administered 2015-08-27: 10 mL via INTRAVENOUS
  Filled 2015-08-27: qty 10

## 2015-08-27 MED ORDER — HEPARIN SOD (PORK) LOCK FLUSH 100 UNIT/ML IV SOLN
500.0000 [IU] | Freq: Once | INTRAVENOUS | Status: AC
  Administered 2015-08-27: 500 [IU] via INTRAVENOUS
  Filled 2015-08-27: qty 5

## 2015-08-27 MED ORDER — HYDROCODONE-ACETAMINOPHEN 5-325 MG PO TABS: 1.0000 | ORAL_TABLET | Freq: Four times a day (QID) | ORAL | Status: DC | PRN

## 2015-08-27 MED ORDER — INFLUENZA VAC SPLIT QUAD 0.5 ML IM SUSY
0.5000 mL | PREFILLED_SYRINGE | Freq: Once | INTRAMUSCULAR | Status: AC
  Administered 2015-08-27: 0.5 mL via INTRAMUSCULAR
  Filled 2015-08-27: qty 0.5

## 2015-08-27 NOTE — Telephone Encounter (Signed)
Gave and printed appts ched and avs for pt for DEC  °

## 2015-08-27 NOTE — Progress Notes (Signed)
  [place] OFFICE PROGRESS NOTE   Diagnosis: AML  INTERVAL HISTORY:   Ms. Sandler returns as scheduled. She feels well. No recent infection. She continues every three-month follow-up with Dr. Florene Glen. She has chronic discomfort at the right shoulder and left hip.  Objective:  Vital signs in last 24 hours:  Resp. rate 18.    HEENT: No thrush or bleeding Resp: Lungs clear bilaterally Cardio: Distant heart sounds GI: No hepatosplenomegaly Vascular: No leg edema   Portacath/PICC-without erythema  Lab Results:  Lab Results  Component Value Date   WBC 6.4 08/27/2015   HGB 11.5* 08/27/2015   HCT 34.2* 08/27/2015   MCV 93.0 08/27/2015   PLT 135* 08/27/2015   NEUTROABS 4.1 08/27/2015     Medications: I have reviewed the patient's current medications.  Assessment/Plan: 1. 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  Status post cycle 3 high-dose araC consolidation 08/22/2014.  Bone marrow 09/15/2014 showed necrotic marrow with minute focus of myeloid precursors. No evidence of viable acute leukemia. 2. Pancytopenia secondary to chemotherapy. Improved.  3. C. difficile colitis-06/09/2014 treated with Flagyl  4. Diabetes  5. renal insufficiency  6. history of hypokalemia and hypomagnesemia. 7. history of atrial fibrillation  9. history of coronary artery disease  10. Hospitalization with febrile neutropenia/sepsis secondary to Escherichia coli bacteremia 09/02/2014. 11. Shingles involving the right back October 2015. 12. Meningioma on brain CT 05/24/2014 and brain MRI 05/24/2014  Disposition:  Ms. Doh  remains in clinical remission from AML. She received an influenza vaccine today. She will see Dr. Florene Glen in 6 weeks and will return for an office visit/Port-A-Cath flush here in 12 weeks.  Betsy Coder, MD  08/27/2015  10:52 AM

## 2015-09-27 ENCOUNTER — Other Ambulatory Visit: Payer: Self-pay | Admitting: Internal Medicine

## 2015-10-05 ENCOUNTER — Encounter: Payer: Self-pay | Admitting: Internal Medicine

## 2015-10-05 ENCOUNTER — Ambulatory Visit (INDEPENDENT_AMBULATORY_CARE_PROVIDER_SITE_OTHER): Payer: Commercial Managed Care - HMO | Admitting: Internal Medicine

## 2015-10-05 VITALS — BP 160/70 | HR 76 | Temp 98.3°F | Resp 20 | Ht 62.0 in | Wt 200.0 lb

## 2015-10-05 DIAGNOSIS — N183 Chronic kidney disease, stage 3 unspecified: Secondary | ICD-10-CM

## 2015-10-05 DIAGNOSIS — E785 Hyperlipidemia, unspecified: Secondary | ICD-10-CM | POA: Diagnosis not present

## 2015-10-05 DIAGNOSIS — I4891 Unspecified atrial fibrillation: Secondary | ICD-10-CM | POA: Diagnosis not present

## 2015-10-05 DIAGNOSIS — E119 Type 2 diabetes mellitus without complications: Secondary | ICD-10-CM

## 2015-10-05 DIAGNOSIS — E0821 Diabetes mellitus due to underlying condition with diabetic nephropathy: Secondary | ICD-10-CM | POA: Diagnosis not present

## 2015-10-05 DIAGNOSIS — I10 Essential (primary) hypertension: Secondary | ICD-10-CM

## 2015-10-05 LAB — LIPID PANEL
Cholesterol: 170 mg/dL (ref 125–200)
HDL: 37 mg/dL — ABNORMAL LOW (ref >=46)
Total CHOL/HDL Ratio: 4.6 Ratio (ref <=5.0)
Triglycerides: 401 mg/dL — ABNORMAL HIGH (ref <150)

## 2015-10-05 LAB — HEMOGLOBIN A1C
HEMOGLOBIN A1C: 9.2 % — AB (ref <5.7)
MEAN PLASMA GLUCOSE: 217 mg/dL — AB (ref <117)

## 2015-10-05 MED ORDER — METFORMIN HCL 500 MG PO TABS: 500.0000 mg | ORAL_TABLET | Freq: Two times a day (BID) | ORAL | Status: DC

## 2015-10-05 MED ORDER — LISINOPRIL 10 MG PO TABS: 10.0000 mg | ORAL_TABLET | Freq: Every day | ORAL | Status: DC

## 2015-10-05 MED ORDER — GLIPIZIDE ER 10 MG PO TB24: 10.0000 mg | ORAL_TABLET | Freq: Every day | ORAL | Status: DC

## 2015-10-05 MED ORDER — HYDROCODONE-ACETAMINOPHEN 5-325 MG PO TABS: 1.0000 | ORAL_TABLET | Freq: Four times a day (QID) | ORAL | Status: DC | PRN

## 2015-10-05 NOTE — Progress Notes (Signed)
Subjective:    Patient ID: [name], female    DOB: [birth year], 74 y.o.   MRN: [id number]  HPI  Lab Results  Component Value Date   HGBA1C 7.2* 04/17/2015    74 year old patient who is seen today in follow-up.  She has type 2 diabetes which has been controlled on oral medications. She has chronic kidney disease with most recent creatinine 1.4 and GFR 38.  She is on a maximal dose of metformin as well as glimepiride  Wt Readings from Last 3 Encounters:  10/05/15 200 lb (90.719 kg)  05/24/15 189 lb 9.6 oz (86.002 kg)  04/17/15 171 lb (77.565 kg)    She generally feels well following treatment for AML with excellent appetite and significant weight gain  Past Medical History  Diagnosis Date  . DIABETES MELLITUS, TYPE II 07/13/2007  . HYPERLIPIDEMIA 12/24/2007  . HYPERTENSION 07/13/2007  . CORONARY ARTERY DISEASE 12/24/2007  . NEPHROLITHIASIS, HX OF 12/24/2007  . Colon polyps 04/29/2010    TUBULAR ADENOMA AND A SERRATED ADENOMA  . Acute pancreatitis   . Unspecified disease of pancreas   . Chronic kidney disease, stage II (mild)   . acute myeloblastic leukemia (AML) dx'd 06/2014    Social History   Social History  . Marital Status: Married    Spouse Name: N/A  . Number of Children: N/A  . Years of Education: N/A   Occupational History  . Not on file.   Social History Main Topics  . Smoking status: Former Smoker    Types: Cigarettes    Quit date: 12/08/2000  . Smokeless tobacco: Never Used  . Alcohol Use: No  . Drug Use: No  . Sexual Activity: Not on file   Other Topics Concern  . Not on file   Social History Narrative    Past Surgical History  Procedure Laterality Date  . Excisional hemorrhoidectomy  unknown  . Abdominal hysterectomy  unknown  . Nasal sinus surgery  unknown  . Cardiac catheterization  2002  . Cataract extraction Bilateral     Family History  Problem Relation Age of Onset  . Cancer Father     throat ca  . COPD Father   . Cancer  Sister     uterine ca and breast ca  . Cancer Brother     throat    No Known Allergies  Current Outpatient Prescriptions on File Prior to Visit  Medication Sig Dispense Refill  . amLODipine (NORVASC) 5 MG tablet TAKE 1 TABLET EVERY DAY 90 tablet 1  . aspirin EC 325 MG tablet Take 325 mg by mouth daily.    . furosemide (LASIX) 40 MG tablet TAKE 1 TABLET (40 MG TOTAL) BY MOUTH DAILY. 90 tablet 1  . glimepiride (AMARYL) 4 MG tablet TAKE 1 TABLET EVERY DAY WITH BREAKFAST 90 tablet 1  . HYDROcodone-acetaminophen (NORCO/VICODIN) 5-325 MG per tablet Take 1 tablet by mouth every 6 (six) hours as needed for moderate pain. 60 tablet 0  . LORazepam (ATIVAN) 0.5 MG tablet Take 1 tablet (0.5 mg total) by mouth every 6 (six) hours as needed for anxiety. 90 tablet 2  . magnesium oxide (MAG-OX) 400 (241.3 MG) MG tablet Take 2 tablets (800 mg total) by mouth 2 (two) times daily. (Patient taking differently: Take 800 mg by mouth daily. ) 60 tablet 0  . metFORMIN (GLUCOPHAGE) 1000 MG tablet TAKE 1 TABLET TWICE DAILY WITH MEALS 180 tablet 1  . metoprolol (LOPRESSOR) 50 MG tablet TAKE 1 TABLET TWICE  DAILY 180 tablet 1  . nitroGLYCERIN (NITROSTAT) 0.4 MG SL tablet Place 1 tablet (0.4 mg total) under the tongue every 5 (five) minutes as needed for chest pain. 90 tablet 1  . polyethylene glycol (MIRALAX / GLYCOLAX) packet Take 17 g by mouth daily as needed for moderate constipation. 14 each 0  . promethazine (PHENERGAN) 12.5 MG tablet Take 2 tablets (25 mg total) by mouth every 6 (six) hours as needed for nausea. 12 tablet 0  . simvastatin (ZOCOR) 20 MG tablet TAKE 1 TABLET EVERY EVENING 90 tablet 1   Current Facility-Administered Medications on File Prior to Visit  Medication Dose Route Frequency Provider Last Rate Last Dose  . sodium chloride 0.9 % 1,000 mL with magnesium sulfate 2 g infusion   Intravenous Continuous Ladell Pier, MD   Stopped at 08/04/14 1441    BP 160/70 mmHg  Pulse 76  Temp(Src) 98.3  F (36.8 C) (Oral)  Resp 20  Ht 5\' 2"  (1.575 m)  Wt 200 lb (90.719 kg)  BMI 36.57 kg/m2  SpO2 98%     Review of Systems  Constitutional: Negative.   HENT: Negative for congestion, dental problem, hearing loss, rhinorrhea, sinus pressure, sore throat and tinnitus.   Eyes: Negative for pain, discharge and visual disturbance.  Respiratory: Negative for cough and shortness of breath.   Cardiovascular: Negative for chest pain, palpitations and leg swelling.  Gastrointestinal: Negative for nausea, vomiting, abdominal pain, diarrhea, constipation, blood in stool and abdominal distention.  Genitourinary: Negative for dysuria, urgency, frequency, hematuria, flank pain, vaginal bleeding, vaginal discharge, difficulty urinating, vaginal pain and pelvic pain.  Musculoskeletal: Negative for joint swelling, arthralgias and gait problem.  Skin: Negative for rash.  Neurological: Negative for dizziness, syncope, speech difficulty, weakness, numbness and headaches.  Hematological: Negative for adenopathy.  Psychiatric/Behavioral: Negative for behavioral problems, dysphoric mood and agitation. The patient is not nervous/anxious.        Objective:   Physical Exam  Constitutional: She is oriented to person, place, and time. She appears well-developed and well-nourished.  Blood pressure 144/76  HENT:  Head: Normocephalic.  Right Ear: External ear normal.  Left Ear: External ear normal.  Mouth/Throat: Oropharynx is clear and moist.  Eyes: Conjunctivae and EOM are normal. Pupils are equal, round, and reactive to light.  Neck: Normal range of motion. Neck supple. No thyromegaly present.  Cardiovascular: Normal rate, regular rhythm and normal heart sounds.   Pulmonary/Chest: Effort normal and breath sounds normal.  Abdominal: Soft. Bowel sounds are normal. She exhibits no mass. There is no tenderness.  Musculoskeletal: Normal range of motion.  Lymphadenopathy:    She has no cervical adenopathy.    Neurological: She is alert and oriented to person, place, and time.  Skin: Skin is warm and dry. No rash noted.  Psychiatric: She has a normal mood and affect. Her behavior is normal.          Assessment & Plan:

## 2015-10-05 NOTE — Addendum Note (Signed)
Addended by: Gari Crown D on: 10/05/2015 05:11 PM   Modules accepted: Orders

## 2015-10-05 NOTE — Progress Notes (Signed)
Pre visit review using our clinic review tool, if applicable. No additional management support is needed unless otherwise documented below in the visit note. 

## 2015-10-05 NOTE — Patient Instructions (Signed)
Limit your sodium (Salt) intake  You need to lose weight.  Consider a lower calorie diet and regular exercise.   Please check your hemoglobin A1c every 3 months    It is important that you exercise regularly, at least 20 minutes 3 to 4 times per week.  If you develop chest pain or shortness of breath seek  medical attention.  Consider dietary referral  Basic Carbohydrate Counting for Diabetes Mellitus Carbohydrate counting is a method for keeping track of the amount of carbohydrates you eat. Eating carbohydrates naturally increases the level of sugar (glucose) in your blood, so it is important for you to know the amount that is okay for you to have in every meal. Carbohydrate counting helps keep the level of glucose in your blood within normal limits. The amount of carbohydrates allowed is different for every person. A dietitian can help you calculate the amount that is right for you. Once you know the amount of carbohydrates you can have, you can count the carbohydrates in the foods you want to eat. Carbohydrates are found in the following foods:  Grains, such as breads and cereals.  Dried beans and soy products.  Starchy vegetables, such as potatoes, peas, and corn.  Fruit and fruit juices.  Milk and yogurt.  Sweets and snack foods, such as cake, cookies, candy, chips, soft drinks, and fruit drinks. CARBOHYDRATE COUNTING There are two ways to count the carbohydrates in your food. You can use either of the methods or a combination of both. Reading the "Nutrition Facts" on [place] The "Nutrition Facts" is an area that is included on the labels of almost all packaged food and beverages in the [place]. It includes the serving size of that food or beverage and information about the nutrients in each serving of the food, including the grams (g) of carbohydrate per serving.  Decide the number of servings of this food or beverage that you will be able to eat or drink. Multiply that  number of servings by the number of grams of carbohydrate that is listed on the label for that serving. The total will be the amount of carbohydrates you will be having when you eat or drink this food or beverage. Learning Standard Serving Sizes of Food When you eat food that is not packaged or does not include "Nutrition Facts" on the label, you need to measure the servings in order to count the amount of carbohydrates.A serving of most carbohydrate-rich foods contains about 15 g of carbohydrates. The following list includes serving sizes of carbohydrate-rich foods that provide 15 g ofcarbohydrate per serving:   1 slice of bread (1 oz) or 1 six-inch tortilla.    of a hamburger bun or English muffin.  4-6 crackers.   cup unsweetened dry cereal.    cup hot cereal.   cup rice or pasta.    cup mashed potatoes or  of a large baked potato.  1 cup fresh fruit or one small piece of fruit.    cup canned or frozen fruit or fruit juice.  1 cup milk.   cup plain fat-free yogurt or yogurt sweetened with artificial sweeteners.   cup cooked dried beans or starchy vegetable, such as peas, corn, or potatoes.  Decide the number of standard-size servings that you will eat. Multiply that number of servings by 15 (the grams of carbohydrates in that serving). For example, if you eat 2 cups of strawberries, you will have eaten 2 servings and 30 g of carbohydrates (  2 servings x 15 g = 30 g). For foods such as soups and casseroles, in which more than one food is mixed in, you will need to count the carbohydrates in each food that is included. EXAMPLE OF CARBOHYDRATE COUNTING Sample Dinner  3 oz chicken breast.   cup of [place] rice.   cup of corn.  1 cup milk.   1 cup strawberries with sugar-free whipped topping.  Carbohydrate Calculation Step 1: Identify the foods that contain carbohydrates:   Rice.   Corn.   Milk.   Strawberries. Step 2:Calculate the number of  servings eaten of each:   2 servings of rice.   1 serving of corn.   1 serving of milk.   1 serving of strawberries. Step 3: Multiply each of those number of servings by 15 g:   2 servings of rice x 15 g = 30 g.   1 serving of corn x 15 g = 15 g.   1 serving of milk x 15 g = 15 g.   1 serving of strawberries x 15 g = 15 g. Step 4: Add together all of the amounts to find the total grams of carbohydrates eaten: 30 g + 15 g + 15 g + 15 g = 75 g.   This information is not intended to replace advice given to you by your health care provider. Make sure you discuss any questions you have with your health care provider.   Document Released: 11/24/2005 Document Revised: 12/15/2014 Document Reviewed: 10/21/2013 Elsevier Interactive Patient Education 2016 [employer]. Diabetes Mellitus and Food It is important for you to manage your blood sugar (glucose) level. Your blood glucose level can be greatly affected by what you eat. Eating healthier foods in the appropriate amounts throughout the day at about the same time each day will help you control your blood glucose level. It can also help slow or prevent worsening of your diabetes mellitus. Healthy eating may even help you improve the level of your blood pressure and reach or maintain a healthy weight.  General recommendations for healthful eating and cooking habits include:  Eating meals and snacks regularly. Avoid going long periods of time without eating to lose weight.  Eating a diet that consists mainly of plant-based foods, such as fruits, vegetables, nuts, legumes, and whole grains.  Using low-heat cooking methods, such as baking, instead of high-heat cooking methods, such as deep frying. Work with your dietitian to make sure you understand how to use the Nutrition Facts information on food labels. HOW CAN FOOD AFFECT ME? Carbohydrates Carbohydrates affect your blood glucose level more than any other type of food. Your  dietitian will help you determine how many carbohydrates to eat at each meal and teach you how to count carbohydrates. Counting carbohydrates is important to keep your blood glucose at a healthy level, especially if you are using insulin or taking certain medicines for diabetes mellitus. Alcohol Alcohol can cause sudden decreases in blood glucose (hypoglycemia), especially if you use insulin or take certain medicines for diabetes mellitus. Hypoglycemia can be a life-threatening condition. Symptoms of hypoglycemia (sleepiness, dizziness, and disorientation) are similar to symptoms of having too much alcohol.  If your health care provider has given you approval to drink alcohol, do so in moderation and use the following guidelines:  Women should not have more than one drink per day, and men should not have more than two drinks per day. One drink is equal to:  12 oz of beer.  5 oz of wine.  1 oz of hard liquor.  Do not drink on an empty stomach.  Keep yourself hydrated. Have water, diet soda, or unsweetened iced tea.  Regular soda, juice, and other mixers might contain a lot of carbohydrates and should be counted. WHAT FOODS ARE NOT RECOMMENDED? As you make food choices, it is important to remember that all foods are not the same. Some foods have fewer nutrients per serving than other foods, even though they might have the same number of calories or carbohydrates. It is difficult to get your body what it needs when you eat foods with fewer nutrients. Examples of foods that you should avoid that are high in calories and carbohydrates but low in nutrients include:  Trans fats (most processed foods list trans fats on the Nutrition Facts label).  Regular soda.  Juice.  Candy.  Sweets, such as cake, pie, doughnuts, and cookies.  Fried foods. WHAT FOODS CAN I EAT? Eat nutrient-rich foods, which will nourish your body and keep you healthy. The food you should eat also will depend on several  factors, including:  The calories you need.  The medicines you take.  Your weight.  Your blood glucose level.  Your blood pressure level.  Your cholesterol level. You should eat a variety of foods, including:  Protein.  Lean cuts of meat.  Proteins low in saturated fats, such as fish, egg whites, and beans. Avoid processed meats.  Fruits and vegetables.  Fruits and vegetables that may help control blood glucose levels, such as apples, mangoes, and yams.  Dairy products.  Choose fat-free or low-fat dairy products, such as milk, yogurt, and cheese.  Grains, bread, pasta, and rice.  Choose whole grain products, such as multigrain bread, whole oats, and [place] rice. These foods may help control blood pressure.  Fats.  Foods containing healthful fats, such as nuts, avocado, olive oil, canola oil, and fish. DOES EVERYONE WITH DIABETES MELLITUS HAVE THE SAME MEAL PLAN? Because every person with diabetes mellitus is different, there is not one meal plan that works for everyone. It is very important that you meet with a dietitian who will help you create a meal plan that is just right for you.   This information is not intended to replace advice given to you by your health care provider. Make sure you discuss any questions you have with your health care provider.   Document Released: 08/21/2005 Document Revised: 12/15/2014 Document Reviewed: 10/21/2013 Elsevier Interactive Patient Education 2016 [place] for Eating Away From Home If You Have Diabetes Controlling your level of blood glucose, also known as blood sugar, can be challenging. It can be even more difficult when you do not prepare your own meals. The following tips can help you manage your diabetes when you eat away from home. PLANNING AHEAD Plan ahead if you know you will be eating away from home:  Ask your health care provider how to time meals and medicine if you are taking insulin.  Make a list of  restaurants near you that offer healthy choices. If they have a carry-out menu, take it home and plan what you will order ahead of time.  Look up the restaurant you want to eat at online. Many chain and fast-food restaurants list nutritional information online. Use this information to choose the healthiest options and to calculate how many carbohydrates will be in your meal.  Use a carbohydrate-counting book or mobile app to look up the carbohydrate content and serving  size of the foods you want to eat.  Become familiar with serving sizes and learn to recognize how many servings are in a portion. This will allow you to estimate how many carbohydrates you can eat. FREE FOODS A "free food" is any food or drink that has less than 5 g of carbohydrates per serving. Free foods include:  Many vegetables.  Hard boiled eggs.  Nuts or seeds.  Olives.  Cheeses.  Meats. These types of foods make good appetizer choices and are often available at salad bars. Lemon juice, vinegar, or a low-calorie salad dressing of fewer than 20 calories per serving can be used as a "free" salad dressing.  CHOICES TO REDUCE CARBOHYDRATES  Substitute nonfat sweetened yogurt with a sugar-free yogurt. Yogurt made from soy milk may also be used, but you will still want a sugar-free or plain option to choose a lower carbohydrate amount.  Ask your server to take away the bread basket or chips from your table.  Order fresh fruit. A salad bar often offers fresh fruit choices. Avoid canned fruit because it is usually packed in sugar or syrup.  Order a salad, and eat it without dressing. Or, create a "free" salad dressing.  Ask for substitutions. For example, instead of [place] fries, request an order of a vegetable such as salad, green beans, or broccoli. OTHER TIPS   If you take insulin, take the insulin once your food arrives to your table. This will ensure your insulin and food are timed correctly.  Ask your server  about the portion size before your order, and ask for a take-out box if the portion has more servings than you should have. When your food comes, leave the amount you should have on the plate, and put the rest in the take-out box.  Consider splitting an entree with someone and ordering a side salad.   This information is not intended to replace advice given to you by your health care provider. Make sure you discuss any questions you have with your health care provider.   Document Released: 11/24/2005 Document Revised: 08/15/2015 Document Reviewed: 02/21/2014 Elsevier Interactive Patient Education [employer].

## 2015-10-06 LAB — MICROALBUMIN / CREATININE URINE RATIO
Creatinine, Urine: 88 mg/dL (ref 20–320)
MICROALB UR: 75.4 mg/dL
MICROALB/CREAT RATIO: 857 ug/mg{creat} — AB (ref <30)

## 2015-10-08 ENCOUNTER — Telehealth: Payer: Self-pay | Admitting: *Deleted

## 2015-10-08 NOTE — Telephone Encounter (Signed)
Pt has been sch for tomorrow

## 2015-10-08 NOTE — Telephone Encounter (Signed)
Please call pt and schedule follow up this week for Diabetes per Dr.K.

## 2015-10-09 ENCOUNTER — Encounter: Payer: Self-pay | Admitting: Internal Medicine

## 2015-10-09 ENCOUNTER — Ambulatory Visit (INDEPENDENT_AMBULATORY_CARE_PROVIDER_SITE_OTHER): Payer: Commercial Managed Care - HMO | Admitting: Internal Medicine

## 2015-10-09 VITALS — BP 136/80 | HR 80 | Temp 98.5°F | Resp 20 | Ht 62.0 in | Wt 197.0 lb

## 2015-10-09 DIAGNOSIS — E0821 Diabetes mellitus due to underlying condition with diabetic nephropathy: Secondary | ICD-10-CM | POA: Diagnosis not present

## 2015-10-09 MED ORDER — INSULIN GLARGINE 300 UNIT/ML ~~LOC~~ SOPN: 12.0000 [IU] | PEN_INJECTOR | Freq: Every day | SUBCUTANEOUS | Status: DC

## 2015-10-09 NOTE — Progress Notes (Signed)
Pre visit review using our clinic review tool, if applicable. No additional management support is needed unless otherwise documented below in the visit note. 

## 2015-10-09 NOTE — Patient Instructions (Signed)
Toujeo  12 units daily at bedtime;  increase by 4 units every week until fasting blood sugar less than 130  Return in one month for follow-up  You need to lose weight.  Consider a lower calorie diet and regular exercise.

## 2015-10-09 NOTE — Progress Notes (Signed)
Subjective:    Patient ID: [name], female    DOB: 1941-05-25, 74 y.o.   MRN: [id number]  HPI  74 year old patient who is seen today for further management of diabetes.  Hemoglobin A1c has increased from 7.2 to 9.2 over the past 6 months, associated with significant weight gain.  Blood sugars are consistently greater than 200. She has chronic kidney disease with GFR in the mid to high thirties  She has been on G LP 1 agonist in the past, but has not been able to afford Presently is on metformin and SU therapy  Past Medical History  Diagnosis Date  . DIABETES MELLITUS, TYPE II 07/13/2007  . HYPERLIPIDEMIA 12/24/2007  . HYPERTENSION 07/13/2007  . CORONARY ARTERY DISEASE 12/24/2007  . NEPHROLITHIASIS, HX OF 12/24/2007  . Colon polyps 04/29/2010    TUBULAR ADENOMA AND A SERRATED ADENOMA  . Acute pancreatitis   . Unspecified disease of pancreas   . Chronic kidney disease, stage II (mild)   . acute myeloblastic leukemia (AML) dx'd 06/2014    Social History   Social History  . Marital Status: Married    Spouse Name: N/A  . Number of Children: N/A  . Years of Education: N/A   Occupational History  . Not on file.   Social History Main Topics  . Smoking status: Former Smoker    Types: Cigarettes    Quit date: 12/08/2000  . Smokeless tobacco: Never Used  . Alcohol Use: No  . Drug Use: No  . Sexual Activity: Not on file   Other Topics Concern  . Not on file   Social History Narrative    Past Surgical History  Procedure Laterality Date  . Excisional hemorrhoidectomy  unknown  . Abdominal hysterectomy  unknown  . Nasal sinus surgery  unknown  . Cardiac catheterization  2002  . Cataract extraction Bilateral     Family History  Problem Relation Age of Onset  . Cancer Father     throat ca  . COPD Father   . Cancer Sister     uterine ca and breast ca  . Cancer Brother     throat    No Known Allergies  Current Outpatient Prescriptions on File Prior to Visit    Medication Sig Dispense Refill  . amLODipine (NORVASC) 5 MG tablet TAKE 1 TABLET EVERY DAY 90 tablet 1  . aspirin EC 325 MG tablet Take 325 mg by mouth daily.    . furosemide (LASIX) 40 MG tablet TAKE 1 TABLET (40 MG TOTAL) BY MOUTH DAILY. 90 tablet 1  . HYDROcodone-acetaminophen (NORCO/VICODIN) 5-325 MG tablet Take 1 tablet by mouth every 6 (six) hours as needed for moderate pain. 90 tablet 0  . lisinopril (PRINIVIL,ZESTRIL) 10 MG tablet Take 1 tablet (10 mg total) by mouth daily. 90 tablet 3  . LORazepam (ATIVAN) 0.5 MG tablet Take 1 tablet (0.5 mg total) by mouth every 6 (six) hours as needed for anxiety. 90 tablet 2  . magnesium oxide (MAG-OX) 400 (241.3 MG) MG tablet Take 2 tablets (800 mg total) by mouth 2 (two) times daily. (Patient taking differently: Take 800 mg by mouth daily. ) 60 tablet 0  . metFORMIN (GLUCOPHAGE) 500 MG tablet Take 1 tablet (500 mg total) by mouth 2 (two) times daily with a meal. (Patient taking differently: Take 1,000 mg by mouth 2 (two) times daily with a meal. ) 180 tablet 2  . metoprolol (LOPRESSOR) 50 MG tablet TAKE 1 TABLET TWICE DAILY 180 tablet  1  . nitroGLYCERIN (NITROSTAT) 0.4 MG SL tablet Place 1 tablet (0.4 mg total) under the tongue every 5 (five) minutes as needed for chest pain. 90 tablet 1  . polyethylene glycol (MIRALAX / GLYCOLAX) packet Take 17 g by mouth daily as needed for moderate constipation. 14 each 0  . promethazine (PHENERGAN) 12.5 MG tablet Take 2 tablets (25 mg total) by mouth every 6 (six) hours as needed for nausea. 12 tablet 0  . simvastatin (ZOCOR) 20 MG tablet TAKE 1 TABLET EVERY EVENING 90 tablet 1  . glipiZIDE (GLUCOTROL XL) 10 MG 24 hr tablet Take 1 tablet (10 mg total) by mouth daily with breakfast. (Patient not taking: Reported on 10/09/2015) 90 tablet 3   Current Facility-Administered Medications on File Prior to Visit  Medication Dose Route Frequency Provider Last Rate Last Dose  . sodium chloride 0.9 % 1,000 mL with magnesium  sulfate 2 g infusion   Intravenous Continuous Ladell Pier, MD   Stopped at 08/04/14 1441    BP 136/80 mmHg  Pulse 80  Temp(Src) 98.5 F (36.9 C) (Oral)  Resp 20  Ht 5\' 2"  (1.575 m)  Wt 197 lb (89.359 kg)  BMI 36.02 kg/m2  SpO2 98%      Review of Systems  Constitutional: Negative.   HENT: Negative for congestion, dental problem, hearing loss, rhinorrhea, sinus pressure, sore throat and tinnitus.   Eyes: Negative for pain, discharge and visual disturbance.  Respiratory: Negative for cough and shortness of breath.   Cardiovascular: Negative for chest pain, palpitations and leg swelling.  Gastrointestinal: Negative for nausea, vomiting, abdominal pain, diarrhea, constipation, blood in stool and abdominal distention.  Genitourinary: Negative for dysuria, urgency, frequency, hematuria, flank pain, vaginal bleeding, vaginal discharge, difficulty urinating, vaginal pain and pelvic pain.  Musculoskeletal: Negative for joint swelling, arthralgias and gait problem.  Skin: Negative for rash.  Neurological: Negative for dizziness, syncope, speech difficulty, weakness, numbness and headaches.  Hematological: Negative for adenopathy.  Psychiatric/Behavioral: Negative for behavioral problems, dysphoric mood and agitation. The patient is not nervous/anxious.        Objective:   Physical Exam  Constitutional: She appears well-developed and well-nourished. No distress.  Blood pressure 130/60 Weight 197          Assessment & Plan:   Diabetes mellitus.  Poor control.  Will initiate insulin therapy with Toujeo 12 units at bedtime.  She will increase her nighttime dose by 4 units weekly until fasting blood sugars consistently less than 1:30.  We'll reassess in 4 weeks

## 2015-10-12 ENCOUNTER — Other Ambulatory Visit: Payer: Self-pay | Admitting: *Deleted

## 2015-10-12 ENCOUNTER — Other Ambulatory Visit: Payer: Self-pay

## 2015-10-12 DIAGNOSIS — M25511 Pain in right shoulder: Secondary | ICD-10-CM | POA: Diagnosis not present

## 2015-10-12 DIAGNOSIS — E875 Hyperkalemia: Secondary | ICD-10-CM | POA: Diagnosis not present

## 2015-10-12 DIAGNOSIS — C9201 Acute myeloblastic leukemia, in remission: Secondary | ICD-10-CM | POA: Diagnosis not present

## 2015-10-12 DIAGNOSIS — E785 Hyperlipidemia, unspecified: Secondary | ICD-10-CM | POA: Diagnosis not present

## 2015-10-12 DIAGNOSIS — K5900 Constipation, unspecified: Secondary | ICD-10-CM | POA: Diagnosis not present

## 2015-10-12 DIAGNOSIS — Z1231 Encounter for screening mammogram for malignant neoplasm of breast: Secondary | ICD-10-CM

## 2015-10-12 DIAGNOSIS — R0902 Hypoxemia: Secondary | ICD-10-CM | POA: Diagnosis not present

## 2015-10-12 DIAGNOSIS — N183 Chronic kidney disease, stage 3 (moderate): Secondary | ICD-10-CM | POA: Diagnosis not present

## 2015-10-12 DIAGNOSIS — N189 Chronic kidney disease, unspecified: Secondary | ICD-10-CM | POA: Diagnosis not present

## 2015-10-12 DIAGNOSIS — I480 Paroxysmal atrial fibrillation: Secondary | ICD-10-CM | POA: Diagnosis not present

## 2015-10-12 DIAGNOSIS — I129 Hypertensive chronic kidney disease with stage 1 through stage 4 chronic kidney disease, or unspecified chronic kidney disease: Secondary | ICD-10-CM | POA: Diagnosis not present

## 2015-10-12 DIAGNOSIS — J90 Pleural effusion, not elsewhere classified: Secondary | ICD-10-CM | POA: Diagnosis not present

## 2015-10-12 DIAGNOSIS — E119 Type 2 diabetes mellitus without complications: Secondary | ICD-10-CM | POA: Diagnosis not present

## 2015-10-12 MED ORDER — METFORMIN HCL 1000 MG PO TABS: 1000.0000 mg | ORAL_TABLET | Freq: Two times a day (BID) | ORAL | Status: DC

## 2015-11-06 ENCOUNTER — Ambulatory Visit
Admission: RE | Admit: 2015-11-06 | Discharge: 2015-11-06 | Disposition: A | Discharge status: Home / Self Care | Payer: Commercial Managed Care - HMO | Source: Ambulatory Visit

## 2015-11-06 DIAGNOSIS — Z1231 Encounter for screening mammogram for malignant neoplasm of breast: Secondary | ICD-10-CM | POA: Diagnosis not present

## 2015-11-12 ENCOUNTER — Other Ambulatory Visit: Payer: Self-pay | Admitting: *Deleted

## 2015-11-12 DIAGNOSIS — E119 Type 2 diabetes mellitus without complications: Secondary | ICD-10-CM

## 2015-11-19 ENCOUNTER — Encounter: Payer: Self-pay | Admitting: Internal Medicine

## 2015-11-19 ENCOUNTER — Ambulatory Visit (INDEPENDENT_AMBULATORY_CARE_PROVIDER_SITE_OTHER): Payer: Commercial Managed Care - HMO | Admitting: Internal Medicine

## 2015-11-19 VITALS — BP 140/80 | HR 82 | Temp 99.1°F | Ht 62.0 in | Wt 203.0 lb

## 2015-11-19 DIAGNOSIS — E0821 Diabetes mellitus due to underlying condition with diabetic nephropathy: Secondary | ICD-10-CM | POA: Diagnosis not present

## 2015-11-19 MED ORDER — INSULIN GLARGINE 300 UNIT/ML ~~LOC~~ SOPN: 30.0000 [IU] | PEN_INJECTOR | Freq: Every day | SUBCUTANEOUS | Status: DC

## 2015-11-19 MED ORDER — METFORMIN HCL 500 MG PO TABS: 500.0000 mg | ORAL_TABLET | Freq: Two times a day (BID) | ORAL | Status: DC

## 2015-11-19 MED ORDER — HYDROCODONE-ACETAMINOPHEN 5-325 MG PO TABS: 1.0000 | ORAL_TABLET | Freq: Four times a day (QID) | ORAL | Status: DC | PRN

## 2015-11-19 NOTE — Progress Notes (Signed)
Pre visit review using our clinic review tool, if applicable. No additional management support is needed unless otherwise documented below in the visit note. 

## 2015-11-19 NOTE — Progress Notes (Signed)
Subjective:    Patient ID: [name], female    DOB: [birth year], 74 y.o.   MRN: [id number]  HPI  Lab Results  Component Value Date   HGBA1C 9.2* 10/05/2015    74 year old patient who is seen today in follow-up.  She was placed on basal insulin approximate 6 weeks ago and glipizide was discontinued.  Metformin was down titrated to 500 mg twice a day due to renal insufficiency. She has been up titrating basal insulin and last night she took 32 units with a fasting blood sugar of 93 this morning.  No hypoglycemia.  She generally feels well She is scheduled for laboratory follow-up later this week  Recent mammogram Colonoscopy scheduled for 12/07/2015  Past Medical History  Diagnosis Date  . DIABETES MELLITUS, TYPE II 07/13/2007  . HYPERLIPIDEMIA 12/24/2007  . HYPERTENSION 07/13/2007  . CORONARY ARTERY DISEASE 12/24/2007  . NEPHROLITHIASIS, HX OF 12/24/2007  . Colon polyps 04/29/2010    TUBULAR ADENOMA AND A SERRATED ADENOMA  . Acute pancreatitis   . Unspecified disease of pancreas   . Chronic kidney disease, stage II (mild)   . acute myeloblastic leukemia (AML) dx'd 06/2014    Social History   Social History  . Marital Status: Married    Spouse Name: N/A  . Number of Children: N/A  . Years of Education: N/A   Occupational History  . Not on file.   Social History Main Topics  . Smoking status: Former Smoker    Types: Cigarettes    Quit date: 12/08/2000  . Smokeless tobacco: Never Used  . Alcohol Use: No  . Drug Use: No  . Sexual Activity: Not on file   Other Topics Concern  . Not on file   Social History Narrative    Past Surgical History  Procedure Laterality Date  . Excisional hemorrhoidectomy  unknown  . Abdominal hysterectomy  unknown  . Nasal sinus surgery  unknown  . Cardiac catheterization  2002  . Cataract extraction Bilateral     Family History  Problem Relation Age of Onset  . Cancer Father     throat ca  . COPD Father   . Cancer Sister    uterine ca and breast ca  . Cancer Brother     throat    No Known Allergies  Current Outpatient Prescriptions on File Prior to Visit  Medication Sig Dispense Refill  . amLODipine (NORVASC) 5 MG tablet TAKE 1 TABLET EVERY DAY 90 tablet 1  . aspirin EC 325 MG tablet Take 325 mg by mouth daily.    . furosemide (LASIX) 40 MG tablet TAKE 1 TABLET (40 MG TOTAL) BY MOUTH DAILY. 90 tablet 1  . lisinopril (PRINIVIL,ZESTRIL) 10 MG tablet Take 1 tablet (10 mg total) by mouth daily. 90 tablet 3  . LORazepam (ATIVAN) 0.5 MG tablet Take 1 tablet (0.5 mg total) by mouth every 6 (six) hours as needed for anxiety. 90 tablet 2  . magnesium oxide (MAG-OX) 400 (241.3 MG) MG tablet Take 2 tablets (800 mg total) by mouth 2 (two) times daily. (Patient taking differently: Take 800 mg by mouth daily. ) 60 tablet 0  . metoprolol (LOPRESSOR) 50 MG tablet TAKE 1 TABLET TWICE DAILY 180 tablet 1  . nitroGLYCERIN (NITROSTAT) 0.4 MG SL tablet Place 1 tablet (0.4 mg total) under the tongue every 5 (five) minutes as needed for chest pain. 90 tablet 1  . polyethylene glycol (MIRALAX / GLYCOLAX) packet Take 17 g by mouth daily as needed  for moderate constipation. 14 each 0  . promethazine (PHENERGAN) 12.5 MG tablet Take 2 tablets (25 mg total) by mouth every 6 (six) hours as needed for nausea. 12 tablet 0  . simvastatin (ZOCOR) 20 MG tablet TAKE 1 TABLET EVERY EVENING 90 tablet 1   Current Facility-Administered Medications on File Prior to Visit  Medication Dose Route Frequency Provider Last Rate Last Dose  . sodium chloride 0.9 % 1,000 mL with magnesium sulfate 2 g infusion   Intravenous Continuous Ladell Pier, MD   Stopped at 08/04/14 1441    BP 140/80 mmHg  Pulse 82  Temp(Src) 99.1 F (37.3 C) (Oral)  Ht 5\' 2"  (1.575 m)  Wt 203 lb (92.08 kg)  BMI 37.12 kg/m2  SpO2 98%     Review of Systems  Constitutional: Negative.   HENT: Negative for congestion, dental problem, hearing loss, rhinorrhea, sinus pressure,  sore throat and tinnitus.   Eyes: Negative for pain, discharge and visual disturbance.  Respiratory: Negative for cough and shortness of breath.   Cardiovascular: Negative for chest pain, palpitations and leg swelling.  Gastrointestinal: Negative for nausea, vomiting, abdominal pain, diarrhea, constipation, blood in stool and abdominal distention.  Genitourinary: Negative for dysuria, urgency, frequency, hematuria, flank pain, vaginal bleeding, vaginal discharge, difficulty urinating, vaginal pain and pelvic pain.  Musculoskeletal: Negative for joint swelling, arthralgias and gait problem.  Skin: Negative for rash.  Neurological: Negative for dizziness, syncope, speech difficulty, weakness, numbness and headaches.  Hematological: Negative for adenopathy.  Psychiatric/Behavioral: Negative for behavioral problems, dysphoric mood and agitation. The patient is not nervous/anxious.        Objective:   Physical Exam  Constitutional: She appears well-developed and well-nourished. No distress.  Blood pressure well controlled          Assessment & Plan:   Diabetes.  Will decrease basal insulin to 30 units Hypertension, stable Chronic kidney disease.  Lab draw scheduled for Friday  Recheck 2 months

## 2015-11-19 NOTE — Patient Instructions (Signed)
Limit your sodium (Salt) intake   Please check your hemoglobin A1c every 3 months   

## 2015-11-20 ENCOUNTER — Telehealth: Payer: Self-pay | Admitting: Oncology

## 2015-11-20 NOTE — Telephone Encounter (Signed)
Due to LT out per BS patient to keep lab/flush for 12/16 and follow up in 6 weeks for lab/flush/LT. Spoke with patient yesterday evening and per patient she can come for lab/flush 11/23/15 but she has would like to cx the 6 week lab/flush/LT due to she will be seeing her doctor in [place] 01/18/16 and will have lab and flush with that visit. Per patient she would like to come for lab/flush 12/16, cx 01/04/16 lab/flush/LT, proceed with her 01/18/16 visits at [facility] and f/u with [place] for lab/flush/LT 6 weeks after her 01/18/16 visit in [place]. Patient made aware I would send a message to BS to see if this was ok and will get back to her after I have received a response. Patient will keep 12/16 appointments and look to hear from me re 01/04/16 appointments. Message to BS this morning re patient's request.

## 2015-11-21 ENCOUNTER — Ambulatory Visit (AMBULATORY_SURGERY_CENTER): Payer: Self-pay

## 2015-11-21 VITALS — Ht 62.0 in | Wt 202.0 lb

## 2015-11-21 DIAGNOSIS — Z86010 Personal history of colonic polyps: Secondary | ICD-10-CM

## 2015-11-21 MED ORDER — NA SULFATE-K SULFATE-MG SULF 17.5-3.13-1.6 GM/177ML PO SOLN
1.0000 | Freq: Once | ORAL | Status: DC
Start: 2015-11-21 — End: 2015-11-21

## 2015-11-21 MED ORDER — NA SULFATE-K SULFATE-MG SULF 17.5-3.13-1.6 GM/177ML PO SOLN: ORAL | Status: DC

## 2015-11-21 NOTE — Progress Notes (Signed)
No egg or soy allergies Not on home 02 No previous anesthesia complications No diet or weight loss meds Sample of Suprep given to pt during PV 11-21-15.

## 2015-11-22 ENCOUNTER — Other Ambulatory Visit: Payer: Self-pay | Admitting: *Deleted

## 2015-11-22 NOTE — Patient Outreach (Signed)
[place] [facility]) Care Management  11/22/2015  JOELEE STALLSWORTH 16-Oct-1941 [id number]   Referral from [place] 4 list: Telephone call to patient; left message on voice mail requesting return call.  Plan: Will follow up. Sherrin Daisy, RN BSN [place] Management Coordinator [facility] Care Management  [phone]

## 2015-11-23 ENCOUNTER — Ambulatory Visit: Payer: Commercial Managed Care - HMO | Admitting: Internal Medicine

## 2015-11-23 ENCOUNTER — Other Ambulatory Visit (HOSPITAL_BASED_OUTPATIENT_CLINIC_OR_DEPARTMENT_OTHER): Payer: Commercial Managed Care - HMO

## 2015-11-23 ENCOUNTER — Ambulatory Visit (HOSPITAL_BASED_OUTPATIENT_CLINIC_OR_DEPARTMENT_OTHER): Payer: Commercial Managed Care - HMO

## 2015-11-23 ENCOUNTER — Ambulatory Visit: Payer: Medicare HMO | Admitting: Nurse Practitioner

## 2015-11-23 VITALS — BP 143/68 | HR 71 | Temp 98.5°F | Resp 18

## 2015-11-23 DIAGNOSIS — C9201 Acute myeloblastic leukemia, in remission: Secondary | ICD-10-CM | POA: Diagnosis not present

## 2015-11-23 DIAGNOSIS — D61810 Antineoplastic chemotherapy induced pancytopenia: Secondary | ICD-10-CM | POA: Diagnosis not present

## 2015-11-23 DIAGNOSIS — Z23 Encounter for immunization: Secondary | ICD-10-CM

## 2015-11-23 DIAGNOSIS — E119 Type 2 diabetes mellitus without complications: Secondary | ICD-10-CM | POA: Diagnosis not present

## 2015-11-23 DIAGNOSIS — Z452 Encounter for adjustment and management of vascular access device: Secondary | ICD-10-CM | POA: Diagnosis not present

## 2015-11-23 DIAGNOSIS — C9500 Acute leukemia of unspecified cell type not having achieved remission: Secondary | ICD-10-CM

## 2015-11-23 DIAGNOSIS — Z95828 Presence of other vascular implants and grafts: Secondary | ICD-10-CM

## 2015-11-23 LAB — COMPREHENSIVE METABOLIC PANEL
ALT: 14 U/L (ref 0–55)
ANION GAP: 8 meq/L (ref 3–11)
AST: 15 U/L (ref 5–34)
Albumin: 3.2 g/dL — ABNORMAL LOW (ref 3.5–5.0)
Alkaline Phosphatase: 81 U/L (ref 40–150)
BILIRUBIN TOTAL: 0.52 mg/dL (ref 0.20–1.20)
BUN: 22.6 mg/dL (ref 7.0–26.0)
CHLORIDE: 109 meq/L (ref 98–109)
CO2: 24 meq/L (ref 22–29)
Calcium: 9.3 mg/dL (ref 8.4–10.4)
Creatinine: 1.4 mg/dL — ABNORMAL HIGH (ref 0.6–1.1)
EGFR: 38 mL/min/{1.73_m2} — AB (ref >90)
Glucose: 199 mg/dl — ABNORMAL HIGH (ref 70–140)
POTASSIUM: 4.5 meq/L (ref 3.5–5.1)
Sodium: 141 mEq/L (ref 136–145)
TOTAL PROTEIN: 6.1 g/dL — AB (ref 6.4–8.3)

## 2015-11-23 LAB — CBC WITH DIFFERENTIAL/PLATELET
BASO%: 0.3 % (ref 0.0–2.0)
BASOS ABS: 0 10*3/uL (ref 0.0–0.1)
EOS ABS: 0.2 10*3/uL (ref 0.0–0.5)
EOS%: 2.4 % (ref 0.0–7.0)
HCT: 35.8 % (ref 34.8–46.6)
HGB: 11.7 g/dL (ref 11.6–15.9)
LYMPH%: 21.4 % (ref 14.0–49.7)
MCH: 31.2 pg (ref 25.1–34.0)
MCHC: 32.6 g/dL (ref 31.5–36.0)
MCV: 95.8 fL (ref 79.5–101.0)
MONO#: 0.6 10*3/uL (ref 0.1–0.9)
MONO%: 8.9 % (ref 0.0–14.0)
NEUT#: 4.3 10*3/uL (ref 1.5–6.5)
NEUT%: 67 % (ref 38.4–76.8)
Platelets: 133 10*3/uL — ABNORMAL LOW (ref 145–400)
RBC: 3.74 10*6/uL (ref 3.70–5.45)
RDW: 13.2 % (ref 11.2–14.5)
WBC: 6.4 10*3/uL (ref 3.9–10.3)
lymph#: 1.4 10*3/uL (ref 0.9–3.3)

## 2015-11-23 LAB — MAGNESIUM: MAGNESIUM: 1.8 mg/dL (ref 1.5–2.5)

## 2015-11-23 MED ORDER — SODIUM CHLORIDE 0.9 % IJ SOLN
10.0000 mL | INTRAMUSCULAR | Status: DC | PRN
  Administered 2015-11-23: 10 mL via INTRAVENOUS
  Filled 2015-11-23: qty 10

## 2015-11-23 MED ORDER — HEPARIN SOD (PORK) LOCK FLUSH 100 UNIT/ML IV SOLN
500.0000 [IU] | Freq: Once | INTRAVENOUS | Status: AC
  Administered 2015-11-23: 500 [IU] via INTRAVENOUS
  Filled 2015-11-23: qty 5

## 2015-11-23 NOTE — Patient Instructions (Signed)

## 2015-11-24 LAB — HEMOGLOBIN A1C
HEMOGLOBIN A1C: 8.5 % — AB (ref <5.7)
MEAN PLASMA GLUCOSE: 197 mg/dL — AB (ref <117)

## 2015-11-29 ENCOUNTER — Telehealth: Payer: Self-pay | Admitting: Oncology

## 2015-11-29 ENCOUNTER — Telehealth: Payer: Self-pay | Admitting: Internal Medicine

## 2015-11-29 ENCOUNTER — Other Ambulatory Visit: Payer: Self-pay | Admitting: *Deleted

## 2015-11-29 ENCOUNTER — Telehealth: Payer: Self-pay | Admitting: *Deleted

## 2015-11-29 NOTE — Patient Outreach (Signed)
[place] [facility]) Care Management  11/29/2015  KERLYN TIDRICK [birth year] [id number]   [facility] HMO Tier 4 list:  Telephone call to patient. HIPPA verification received from patient.  Subjective: Patient agreed to complete screening assessment. Voices that she currently has diagnosis of leukemia, diabetes, and HTN. States at last primary care office visit her hemoglobin A1c was 8.5 and that target was 7 or less. States she is in remission with leukemia and sees oncologist-every 6 weeks and gets blood work done locally (Dr. [name]).  States she gets follow up at [facility] every 2 months.  States blood pressure is under control.  Patient voices that she is taking medications consistently and as prescribed by her doctors. States she has never had any formal diabetic education. Currently owns workable glucometer and checks her blood sugar 2-3 times daily. She is aware of elevated hemoglobin A1c level and wants to get level down to target level.  Patient voices that she is taking medications as prescribed by her doctors consistently.   Patient advised of [facility] care management services. Patient voices interest in learning more about her diabetes condition. States she works part-time and availability to talk is limited. Agrees to [employer] and will take call to try to set up convenient time for phone calls.  Objective: See screening assessment as noted.  See medication list as noted in system.  Assessment: Recommended Health Coach for disease management for diabetes mellitus.  Patient interested in getting more information about diabetes mellitus.  Patient interested in Health Coaching regarding diabetes if able to set convenient times. Patient works part-time.  Plan: Referral to care management assistant to refer to Health Coach. Telephonic signing off.   Sherrin Daisy, RN BSN [place] Management Coordinator [facility] Care Management  [phone]

## 2015-11-29 NOTE — Telephone Encounter (Signed)
Patient called today to r/s 1/27 lab/flush and LT to 03/01/15 due to she is being seen in [place] 2/10. This was a previous request of patient when December appointments were rescheduled and okayed per [facility] - see staff  Message from [facility] 12/13. Patient has new date/time for 02/29/2016.

## 2015-11-29 NOTE — Telephone Encounter (Signed)
Pt call to say her blood sugar on 11/23/15 was 8.5

## 2015-11-29 NOTE — Telephone Encounter (Signed)
Noted, lab result is in chart.

## 2015-11-29 NOTE — Telephone Encounter (Signed)
VM message received from patient @ 8:26 am requesting lab results from 11/23/15.  TC back to patient and reviewed CBC, and A1C with her.  She wants to change her next appt with Dr. [name] scheduled for 01/04/16 to after her appt @ [facility] , which is Feb. 11, 2017. Transferred her call to scheduling. No other issues verbalized. States she feels well.

## 2015-12-07 ENCOUNTER — Ambulatory Visit (AMBULATORY_SURGERY_CENTER): Payer: Commercial Managed Care - HMO | Admitting: Internal Medicine

## 2015-12-07 ENCOUNTER — Encounter: Payer: Self-pay | Admitting: Internal Medicine

## 2015-12-07 VITALS — BP 162/82 | HR 80 | Temp 97.9°F | Resp 15 | Ht 62.0 in | Wt 202.0 lb

## 2015-12-07 DIAGNOSIS — Z86010 Personal history of colonic polyps: Secondary | ICD-10-CM

## 2015-12-07 DIAGNOSIS — D123 Benign neoplasm of transverse colon: Secondary | ICD-10-CM

## 2015-12-07 DIAGNOSIS — I10 Essential (primary) hypertension: Secondary | ICD-10-CM | POA: Diagnosis not present

## 2015-12-07 DIAGNOSIS — E119 Type 2 diabetes mellitus without complications: Secondary | ICD-10-CM | POA: Diagnosis not present

## 2015-12-07 DIAGNOSIS — I2510 Atherosclerotic heart disease of native coronary artery without angina pectoris: Secondary | ICD-10-CM | POA: Diagnosis not present

## 2015-12-07 LAB — GLUCOSE, CAPILLARY
Glucose-Capillary: 110 mg/dL — ABNORMAL HIGH (ref 65–99)
Glucose-Capillary: 98 mg/dL (ref 65–99)

## 2015-12-07 MED ORDER — SODIUM CHLORIDE 0.9 % IV SOLN: 500.0000 mL | INTRAVENOUS | Status: DC

## 2015-12-07 NOTE — Progress Notes (Signed)
To recovery, report to [place], RN, VSS. 

## 2015-12-07 NOTE — Progress Notes (Signed)
Called to room to assist during endoscopic procedure.  Patient ID and intended procedure confirmed with present staff. Received instructions for my participation in the procedure from the performing physician.  

## 2015-12-07 NOTE — Op Note (Signed)
[place]  [address] [place], [address]   COLONOSCOPY PROCEDURE REPORT  PATIENT: [name], [name]  MR#: [id number] BIRTHDATE: [birth year] , 74  yrs. old GENDER: female ENDOSCOPIST: Eustace Quail, MD REFERRED [id number] Program Recall PROCEDURE DATE:  12/07/2015 PROCEDURE:   Colonoscopy, surveillance and Colonoscopy with snare polypectomy x 1 First Screening Colonoscopy - Avg.  risk and is 50 yrs.  old or older - No.  Prior Negative Screening - Now for repeat screening. N/A  History of Adenoma - Now for follow-up colonoscopy & has been > or = to 3 yrs.  Yes hx of adenoma.  Has been 3 or more years since last colonoscopy.  Polyps removed today? Yes ASA CLASS:   Class II INDICATIONS:Surveillance due to prior colonic neoplasia and PH Colon Adenoma.. Index examination May 2011 with tubular adenoma and sessile serrated polyp MEDICATIONS: Monitored anesthesia care and Propofol 200 mg IV  DESCRIPTION OF PROCEDURE:   After the risks benefits and alternatives of the procedure were thoroughly explained, informed consent was obtained.  The digital rectal exam revealed no abnormalities of the rectum.   The LB [id number]  endoscope was introduced through the anus and advanced to the cecum, which was identified by both the appendix and ileocecal valve. No adverse events experienced.   The quality of the prep was excellent. (Suprep was used)  The instrument was then slowly withdrawn as the colon was fully examined. Estimated blood loss is zero unless otherwise noted in this procedure report.  COLON FINDINGS: A single polyp measuring 4 mm in size was found in the transverse colon.  A polypectomy was performed with a cold snare.  The resection was complete, the polyp tissue was completely retrieved and sent to histology.   Arteriovenous malformation(s) measuring 102mm in size were found at the cecum.   The examination was otherwise normal.  Retroflexed views  revealed internal hemorrhoids. The time to cecum = 2.4 Withdrawal time = 9.5   The scope was withdrawn and the procedure completed. COMPLICATIONS: There were no immediate complications.  ENDOSCOPIC IMPRESSION: 1.   Single polyp was found in the transverse colon; polypectomy was performed with a cold snare 2.   Arteriovenous malformation(s) measuring 66mm in size were found at the cecum 3.   The examination was otherwise normal  RECOMMENDATIONS: 1. Return to the care of your primary provider.  GI follow up as needed  eSigned:  Eustace Quail, MD 12/07/2015 3:43 PM   cc: The Patient

## 2015-12-07 NOTE — Patient Instructions (Signed)
YOU HAD AN ENDOSCOPIC PROCEDURE TODAY AT [facility]:   Refer to the procedure report that was given to you for any specific questions about what was found during the examination.  If the procedure report does not answer your questions, please call your gastroenterologist to clarify.  If you requested that your care partner not be given the details of your procedure findings, then the procedure report has been included in a sealed envelope for you to review at your convenience later.  YOU SHOULD EXPECT: Some feelings of bloating in the abdomen. Passage of more gas than usual.  Walking can help get rid of the air that was put into your GI tract during the procedure and reduce the bloating. If you had a lower endoscopy (such as a colonoscopy or flexible sigmoidoscopy) you may notice spotting of blood in your stool or on the toilet paper. If you underwent a bowel prep for your procedure, you may not have a normal bowel movement for a few days.  Please Note:  You might notice some irritation and congestion in your nose or some drainage.  This is from the oxygen used during your procedure.  There is no need for concern and it should clear up in a day or so.  SYMPTOMS TO REPORT IMMEDIATELY:   Following lower endoscopy (colonoscopy or flexible sigmoidoscopy):  Excessive amounts of blood in the stool  Significant tenderness or worsening of abdominal pains  Swelling of the abdomen that is new, acute  Fever of 100F or higher   For urgent or emergent issues, a gastroenterologist can be reached at any hour by calling [phone].   DIET: Your first meal following the procedure should be a small meal and then it is ok to progress to your normal diet. Heavy or fried foods are harder to digest and may make you feel nauseous or bloated.  Likewise, meals heavy in dairy and vegetables can increase bloating.  Drink plenty of fluids but you should avoid alcoholic beverages for 24  hours.  ACTIVITY:  You should plan to take it easy for the rest of today and you should NOT DRIVE or use heavy machinery until tomorrow (because of the sedation medicines used during the test).    FOLLOW UP: Our staff will call the number listed on your records the next business day following your procedure to check on you and address any questions or concerns that you may have regarding the information given to you following your procedure. If we do not reach you, we will leave a message.  However, if you are feeling well and you are not experiencing any problems, there is no need to return our call.  We will assume that you have returned to your regular daily activities without incident.  If any biopsies were taken you will be contacted by phone or by letter within the next 1-3 weeks.  Please call us at [phone] if you have not heard about the biopsies in 3 weeks.    SIGNATURES/CONFIDENTIALITY: You and/or your care partner have signed paperwork which will be entered into your electronic medical record.  These signatures attest to the fact that that the information above on your After Visit Summary has been reviewed and is understood.  Full responsibility of the confidentiality of this discharge information lies with you and/or your care-partner.  Polyp-handouts given  Return to your primary care doctor, Follow-up with GI as needed.

## 2015-12-11 ENCOUNTER — Telehealth: Payer: Self-pay | Admitting: *Deleted

## 2015-12-11 NOTE — Telephone Encounter (Signed)
  Follow up Call-  Call back number 12/07/2015  Post procedure Call Back phone  # [phone] home #  Permission to leave phone message Yes     Patient questions:  Do you have a fever, pain , or abdominal swelling? No. Pain Score  0 *  Have you tolerated food without any problems? Yes.    Have you been able to return to your normal activities? Yes.    Do you have any questions about your discharge instructions: Diet   No. Medications  No. Follow up visit  No.  Do you have questions or concerns about your Care? No.  Actions: * If pain score is 4 or above: No action needed, pain <4.

## 2015-12-13 ENCOUNTER — Encounter: Payer: Self-pay | Admitting: Internal Medicine

## 2015-12-25 ENCOUNTER — Telehealth: Payer: Self-pay | Admitting: Internal Medicine

## 2015-12-25 NOTE — Telephone Encounter (Signed)
Pt call to ask for an order to take with her to [facility] to have her A1C DRAWN  Since she has an appt that day    Fax  [phone]

## 2015-12-25 NOTE — Telephone Encounter (Signed)
I used sda for 01/21/16 pt had to be rescheduled from 01/18/16 she need a 4:15 appt .Marland Kitchen

## 2015-12-26 NOTE — Telephone Encounter (Signed)
Noted  

## 2015-12-27 ENCOUNTER — Other Ambulatory Visit: Payer: Self-pay | Admitting: *Deleted

## 2015-12-27 NOTE — Patient Outreach (Signed)
[place] [facility]) Care Management  12/27/2015  [name] Feb 18, 1941 [id number]   RN Health Coach  Attempted introduction  outreach call to patient.  Patient was unavailable. HIPPA compliance  message was left with return callback number.    [place] Care Management [phone]

## 2016-01-02 ENCOUNTER — Encounter: Payer: Self-pay | Admitting: Family Medicine

## 2016-01-04 ENCOUNTER — Ambulatory Visit: Payer: Commercial Managed Care - HMO | Admitting: Nurse Practitioner

## 2016-01-04 ENCOUNTER — Other Ambulatory Visit: Payer: Commercial Managed Care - HMO

## 2016-01-18 ENCOUNTER — Ambulatory Visit: Payer: Commercial Managed Care - HMO | Admitting: Internal Medicine

## 2016-01-18 DIAGNOSIS — I2510 Atherosclerotic heart disease of native coronary artery without angina pectoris: Secondary | ICD-10-CM | POA: Diagnosis not present

## 2016-01-18 DIAGNOSIS — C9201 Acute myeloblastic leukemia, in remission: Secondary | ICD-10-CM | POA: Diagnosis not present

## 2016-01-18 DIAGNOSIS — K5900 Constipation, unspecified: Secondary | ICD-10-CM | POA: Diagnosis not present

## 2016-01-18 DIAGNOSIS — I480 Paroxysmal atrial fibrillation: Secondary | ICD-10-CM | POA: Diagnosis not present

## 2016-01-18 DIAGNOSIS — N183 Chronic kidney disease, stage 3 (moderate): Secondary | ICD-10-CM | POA: Diagnosis not present

## 2016-01-18 DIAGNOSIS — E1122 Type 2 diabetes mellitus with diabetic chronic kidney disease: Secondary | ICD-10-CM | POA: Diagnosis not present

## 2016-01-18 DIAGNOSIS — D696 Thrombocytopenia, unspecified: Secondary | ICD-10-CM | POA: Diagnosis not present

## 2016-01-18 DIAGNOSIS — I129 Hypertensive chronic kidney disease with stage 1 through stage 4 chronic kidney disease, or unspecified chronic kidney disease: Secondary | ICD-10-CM | POA: Diagnosis not present

## 2016-01-18 DIAGNOSIS — E785 Hyperlipidemia, unspecified: Secondary | ICD-10-CM | POA: Diagnosis not present

## 2016-01-21 ENCOUNTER — Encounter: Payer: Self-pay | Admitting: Internal Medicine

## 2016-01-21 ENCOUNTER — Ambulatory Visit (INDEPENDENT_AMBULATORY_CARE_PROVIDER_SITE_OTHER): Payer: Commercial Managed Care - HMO | Admitting: Internal Medicine

## 2016-01-21 ENCOUNTER — Telehealth: Payer: Self-pay | Admitting: Oncology

## 2016-01-21 VITALS — BP 140/60 | HR 78 | Temp 98.5°F | Resp 22 | Ht 62.0 in | Wt 201.0 lb

## 2016-01-21 DIAGNOSIS — I10 Essential (primary) hypertension: Secondary | ICD-10-CM

## 2016-01-21 DIAGNOSIS — C9201 Acute myeloblastic leukemia, in remission: Secondary | ICD-10-CM

## 2016-01-21 DIAGNOSIS — E0821 Diabetes mellitus due to underlying condition with diabetic nephropathy: Secondary | ICD-10-CM | POA: Diagnosis not present

## 2016-01-21 DIAGNOSIS — Z794 Long term (current) use of insulin: Secondary | ICD-10-CM

## 2016-01-21 DIAGNOSIS — N183 Chronic kidney disease, stage 3 unspecified: Secondary | ICD-10-CM

## 2016-01-21 MED ORDER — INSULIN GLARGINE 300 UNIT/ML ~~LOC~~ SOPN: 36.0000 [IU] | PEN_INJECTOR | Freq: Every day | SUBCUTANEOUS | Status: DC

## 2016-01-21 MED ORDER — INSULIN LISPRO 200 UNIT/ML ~~LOC~~ SOPN: 6.0000 [IU] | PEN_INJECTOR | Freq: Two times a day (BID) | SUBCUTANEOUS | Status: DC

## 2016-01-21 MED ORDER — HYDROCODONE-ACETAMINOPHEN 5-325 MG PO TABS: 1.0000 | ORAL_TABLET | Freq: Four times a day (QID) | ORAL | Status: DC | PRN

## 2016-01-21 NOTE — Telephone Encounter (Signed)
Left message for patient to confirm date/time of March appointments

## 2016-01-21 NOTE — Progress Notes (Signed)
Pre visit review using our clinic review tool, if applicable. No additional management support is needed unless otherwise documented below in the visit note. 

## 2016-01-21 NOTE — Patient Instructions (Signed)
Increase Toujeo to 36 units daily  Start Humalog mealtime insulin prior to breakfast and your evening meal- 6 units; if blood sugar is greater than 200, add an additional 4 units  Return in one month for follow-up

## 2016-01-21 NOTE — Progress Notes (Signed)
Subjective:    Patient ID: [name], female    DOB: 1941/05/06, 75 y.o.   MRN: [id number]  HPI  75 year old patient who is seen today for follow-up of diabetes.  Lab Results  Component Value Date   HGBA1C 8.5* 11/23/2015    She states that she was seen at [facility] last weekend hemoglobin A1c was repeated and was 9.6  She states fasting blood sugars are generally greater than 200.  She states that she eats a very light breakfast and a modest lunch and dinner.  She still works and has lunch at work  She has essential hypertension.  She is followed by oncology with a history of AML. She does have a history of chronic kidney disease and is on submaximal dose of metformin.  Wt Readings from Last 3 Encounters:  01/21/16 201 lb (91.173 kg)  12/07/15 202 lb (91.627 kg)  11/21/15 202 lb (91.627 kg)    Past Medical History  Diagnosis Date  . DIABETES MELLITUS, TYPE II 07/13/2007  . HYPERLIPIDEMIA 12/24/2007  . HYPERTENSION 07/13/2007  . CORONARY ARTERY DISEASE 12/24/2007  . NEPHROLITHIASIS, HX OF 12/24/2007  . Colon polyps 04/29/2010    TUBULAR ADENOMA AND A SERRATED ADENOMA  . Acute pancreatitis   . Unspecified disease of pancreas   . Chronic kidney disease, stage II (mild)   . acute myeloblastic leukemia (AML) dx'd 06/2014  . Blood transfusion without reported diagnosis 2015    Leukemia    Social History   Social History  . Marital Status: Married    Spouse Name: N/A  . Number of Children: N/A  . Years of Education: N/A   Occupational History  . Not on file.   Social History Main Topics  . Smoking status: Former Smoker    Types: Cigarettes    Quit date: 12/08/2000  . Smokeless tobacco: Never Used  . Alcohol Use: No  . Drug Use: No  . Sexual Activity: Not on file   Other Topics Concern  . Not on file   Social History Narrative    Past Surgical History  Procedure Laterality Date  . Excisional hemorrhoidectomy  unknown  . Abdominal hysterectomy   unknown  . Nasal sinus surgery  unknown  . Cardiac catheterization  2002  . Cataract extraction Bilateral   . Colonoscopy  2011  . Polypectomy  2011    Family History  Problem Relation Age of Onset  . Cancer Father     throat ca  . COPD Father   . Cancer Sister     uterine ca and breast ca  . Cancer Brother     throat  . Colon cancer Neg Hx     No Known Allergies  Current Outpatient Prescriptions on File Prior to Visit  Medication Sig Dispense Refill  . amLODipine (NORVASC) 5 MG tablet TAKE 1 TABLET EVERY DAY 90 tablet 1  . aspirin EC 325 MG tablet Take 325 mg by mouth daily.    . furosemide (LASIX) 40 MG tablet TAKE 1 TABLET (40 MG TOTAL) BY MOUTH DAILY. 90 tablet 1  . HYDROcodone-acetaminophen (NORCO/VICODIN) 5-325 MG tablet Take 1 tablet by mouth every 6 (six) hours as needed for moderate pain. 90 tablet 0  . Insulin Glargine (TOUJEO SOLOSTAR) 300 UNIT/ML SOPN Inject 30 Units into the skin at bedtime. 1 pen 6  . lisinopril (PRINIVIL,ZESTRIL) 10 MG tablet Take 1 tablet (10 mg total) by mouth daily. 90 tablet 3  . LORazepam (ATIVAN) 0.5 MG tablet  Take 1 tablet (0.5 mg total) by mouth every 6 (six) hours as needed for anxiety. 90 tablet 2  . magnesium oxide (MAG-OX) 400 (241.3 MG) MG tablet Take 2 tablets (800 mg total) by mouth 2 (two) times daily. 60 tablet 0  . metFORMIN (GLUCOPHAGE) 500 MG tablet Take 1 tablet (500 mg total) by mouth 2 (two) times daily with a meal. 180 tablet 3  . metoprolol (LOPRESSOR) 50 MG tablet TAKE 1 TABLET TWICE DAILY 180 tablet 1  . nitroGLYCERIN (NITROSTAT) 0.4 MG SL tablet Place 1 tablet (0.4 mg total) under the tongue every 5 (five) minutes as needed for chest pain. 90 tablet 1  . promethazine (PHENERGAN) 12.5 MG tablet Take 2 tablets (25 mg total) by mouth every 6 (six) hours as needed for nausea. 12 tablet 0  . simvastatin (ZOCOR) 20 MG tablet TAKE 1 TABLET EVERY EVENING 90 tablet 1   Current Facility-Administered Medications on File Prior to  Visit  Medication Dose Route Frequency Provider Last Rate Last Dose  . sodium chloride 0.9 % 1,000 mL with magnesium sulfate 2 g infusion   Intravenous Continuous Ladell Pier, MD   Stopped at 08/04/14 1441    BP 140/60 mmHg  Pulse 78  Temp(Src) 98.5 F (36.9 C) (Oral)  Resp 22  Ht 5\' 2"  (1.575 m)  Wt 201 lb (91.173 kg)  BMI 36.75 kg/m2  SpO2 98%     Review of Systems  Constitutional: Negative.   HENT: Negative for congestion, dental problem, hearing loss, rhinorrhea, sinus pressure, sore throat and tinnitus.   Eyes: Negative for pain, discharge and visual disturbance.  Respiratory: Negative for cough and shortness of breath.   Cardiovascular: Negative for chest pain, palpitations and leg swelling.  Gastrointestinal: Negative for nausea, vomiting, abdominal pain, diarrhea, constipation, blood in stool and abdominal distention.  Genitourinary: Negative for dysuria, urgency, frequency, hematuria, flank pain, vaginal bleeding, vaginal discharge, difficulty urinating, vaginal pain and pelvic pain.  Musculoskeletal: Positive for back pain. Negative for joint swelling, arthralgias and gait problem.  Skin: Negative for rash.  Neurological: Negative for dizziness, syncope, speech difficulty, weakness, numbness and headaches.  Hematological: Negative for adenopathy.  Psychiatric/Behavioral: Negative for behavioral problems, dysphoric mood and agitation. The patient is not nervous/anxious.        Objective:   Physical Exam  Constitutional: She is oriented to person, place, and time. She appears well-developed and well-nourished.  HENT:  Head: Normocephalic.  Right Ear: External ear normal.  Left Ear: External ear normal.  Mouth/Throat: Oropharynx is clear and moist.  Eyes: Conjunctivae and EOM are normal. Pupils are equal, round, and reactive to light.  Neck: Normal range of motion. Neck supple. No thyromegaly present.  Cardiovascular: Normal rate, regular rhythm, normal heart  sounds and intact distal pulses.   Pulmonary/Chest: Effort normal and breath sounds normal.  Abdominal: Soft. Bowel sounds are normal. She exhibits no mass. There is no tenderness.  Musculoskeletal: Normal range of motion.  Lymphadenopathy:    She has no cervical adenopathy.  Neurological: She is alert and oriented to person, place, and time.  Skin: Skin is warm and dry. No rash noted.  Psychiatric: She has a normal mood and affect. Her behavior is normal.          Assessment & Plan:   Diabetes mellitus.  Poor control.  Will initiate mealtime insulin.  Parameters discussed Hypertension History of AML.  Follow-up oncology  Recheck 4 weeks

## 2016-01-23 ENCOUNTER — Other Ambulatory Visit: Payer: Self-pay | Admitting: *Deleted

## 2016-01-23 DIAGNOSIS — C9200 Acute myeloblastic leukemia, not having achieved remission: Secondary | ICD-10-CM

## 2016-01-24 ENCOUNTER — Other Ambulatory Visit: Payer: Self-pay | Admitting: *Deleted

## 2016-01-24 NOTE — Patient Outreach (Addendum)
[place] [facility]) Care Management  01/24/2016  [name] Mar 19, 1941 [id number]   RN Health Coach telephone call to patient.  [place] compliance verified. RN Health Coach  introduced self to patient. Patient was very agreeable to follow up out reach calls. Per patient she would like for the nurse to call her after 5 pm.  [place] made patient aware that office closed at 5 pm and that [name] would try to make her the last call Patient works part-time and is not sure of the convenient  time to receive calls. RN explained may have to leave message for her to call back,  Patient was referred by  Sherrin Daisy  Plan: [place] will do follow up call within a month for assessment. Patient agreed to follow up call  Johny Shock, BSN, [place] Management RN Health Coach Phone: [place] complies with applicable Federal civil rights laws and does not discriminate on the basis of race, color, national origin, age, disability, or sex. Espaol (Spanish)  [facility] cumple con las leyes federales de derechos civiles aplicables y no discrimina por motivos de raza, color, nacionalidad, edad, discapacidad o sexo.    [place] ([place]  [place] tun th? lu?t dn quy?n hi?n hnh c?a Lin bang v khng phn bi?t ?i x? d?a trn ch?ng t?c, mu da, ngu?n g?c qu?c gia, ? tu?i, khuy?t t?t, ho?c gi?i tnh.    (Arabic)    [place]                      .

## 2016-02-15 ENCOUNTER — Ambulatory Visit (INDEPENDENT_AMBULATORY_CARE_PROVIDER_SITE_OTHER): Payer: Commercial Managed Care - HMO | Admitting: Internal Medicine

## 2016-02-15 ENCOUNTER — Encounter: Payer: Self-pay | Admitting: Internal Medicine

## 2016-02-15 VITALS — BP 140/80 | HR 86 | Temp 98.6°F | Resp 20 | Ht 62.0 in | Wt 207.0 lb

## 2016-02-15 DIAGNOSIS — D696 Thrombocytopenia, unspecified: Secondary | ICD-10-CM | POA: Diagnosis not present

## 2016-02-15 DIAGNOSIS — E0821 Diabetes mellitus due to underlying condition with diabetic nephropathy: Secondary | ICD-10-CM

## 2016-02-15 DIAGNOSIS — N183 Chronic kidney disease, stage 3 unspecified: Secondary | ICD-10-CM

## 2016-02-15 MED ORDER — INSULIN LISPRO 200 UNIT/ML ~~LOC~~ SOPN: 15.0000 [IU] | PEN_INJECTOR | Freq: Three times a day (TID) | SUBCUTANEOUS | Status: DC

## 2016-02-15 MED ORDER — LORAZEPAM 0.5 MG PO TABS: 0.5000 mg | ORAL_TABLET | Freq: Four times a day (QID) | ORAL | Status: DC | PRN

## 2016-02-15 MED ORDER — MAGNESIUM OXIDE 400 (241.3 MG) MG PO TABS: 800.0000 mg | ORAL_TABLET | Freq: Two times a day (BID) | ORAL | Status: DC

## 2016-02-15 MED ORDER — INSULIN GLARGINE 300 UNIT/ML ~~LOC~~ SOPN: 40.0000 [IU] | PEN_INJECTOR | Freq: Every day | SUBCUTANEOUS | Status: DC

## 2016-02-15 MED ORDER — HYDROCODONE-ACETAMINOPHEN 5-325 MG PO TABS: 1.0000 | ORAL_TABLET | Freq: Four times a day (QID) | ORAL | Status: DC | PRN

## 2016-02-15 NOTE — Progress Notes (Signed)
Pre visit review using our clinic review tool, if applicable. No additional management support is needed unless otherwise documented below in the visit note. 

## 2016-02-15 NOTE — Progress Notes (Signed)
Subjective:    Patient ID: [name], female    DOB: 01/13/1941, 75 y.o.   MRN: [id number]  HPI  Lab Results  Component Value Date   HGBA1C 8.5* 11/23/2015   75 year old patient who was placed on mealtime insulin .  4 weeks ago.  She is on basal insulin 36 units and is taking 10 units prior to breakfast and 10 units prior to her evening meal. Blood sugars generally run 150-275  in the morning and higher throughout the rest of the day.  Occasionally over 400 She works through the day and has not been using insulin prior to lunch  Past Medical History  Diagnosis Date  . DIABETES MELLITUS, TYPE II 07/13/2007  . HYPERLIPIDEMIA 12/24/2007  . HYPERTENSION 07/13/2007  . CORONARY ARTERY DISEASE 12/24/2007  . NEPHROLITHIASIS, HX OF 12/24/2007  . Colon polyps 04/29/2010    TUBULAR ADENOMA AND A SERRATED ADENOMA  . Acute pancreatitis   . Unspecified disease of pancreas   . Chronic kidney disease, stage II (mild)   . acute myeloblastic leukemia (AML) dx'd 06/2014  . Blood transfusion without reported diagnosis 2015    Leukemia    Social History   Social History  . Marital Status: Married    Spouse Name: N/A  . Number of Children: N/A  . Years of Education: N/A   Occupational History  . Not on file.   Social History Main Topics  . Smoking status: Former Smoker    Types: Cigarettes    Quit date: 12/08/2000  . Smokeless tobacco: Never Used  . Alcohol Use: No  . Drug Use: No  . Sexual Activity: Not on file   Other Topics Concern  . Not on file   Social History Narrative    Past Surgical History  Procedure Laterality Date  . Excisional hemorrhoidectomy  unknown  . Abdominal hysterectomy  unknown  . Nasal sinus surgery  unknown  . Cardiac catheterization  2002  . Cataract extraction Bilateral   . Colonoscopy  2011  . Polypectomy  2011    Family History  Problem Relation Age of Onset  . Cancer Father     throat ca  . COPD Father   . Cancer Sister     uterine ca and  breast ca  . Cancer Brother     throat  . Colon cancer Neg Hx     No Known Allergies  Current Outpatient Prescriptions on File Prior to Visit  Medication Sig Dispense Refill  . amLODipine (NORVASC) 5 MG tablet TAKE 1 TABLET EVERY DAY 90 tablet 1  . aspirin EC 325 MG tablet Take 325 mg by mouth daily.    . Blood Glucose Monitoring Suppl (TRUE METRIX AIR GLUCOSE METER) w/Device KIT     . furosemide (LASIX) 40 MG tablet TAKE 1 TABLET (40 MG TOTAL) BY MOUTH DAILY. 90 tablet 1  . lisinopril (PRINIVIL,ZESTRIL) 10 MG tablet Take 1 tablet (10 mg total) by mouth daily. 90 tablet 3  . metFORMIN (GLUCOPHAGE) 500 MG tablet Take 1 tablet (500 mg total) by mouth 2 (two) times daily with a meal. 180 tablet 3  . metoprolol (LOPRESSOR) 50 MG tablet TAKE 1 TABLET TWICE DAILY 180 tablet 1  . nitroGLYCERIN (NITROSTAT) 0.4 MG SL tablet Place 1 tablet (0.4 mg total) under the tongue every 5 (five) minutes as needed for chest pain. 90 tablet 1  . promethazine (PHENERGAN) 12.5 MG tablet Take 2 tablets (25 mg total) by mouth every 6 (six) hours  as needed for nausea. 12 tablet 0  . simvastatin (ZOCOR) 20 MG tablet TAKE 1 TABLET EVERY EVENING 90 tablet 1  . TRUE METRIX BLOOD GLUCOSE TEST test strip      Current Facility-Administered Medications on File Prior to Visit  Medication Dose Route Frequency Provider Last Rate Last Dose  . sodium chloride 0.9 % 1,000 mL with magnesium sulfate 2 g infusion   Intravenous Continuous Ladell Pier, MD   Stopped at 08/04/14 1441    BP 140/80 mmHg  Pulse 86  Temp(Src) 98.6 F (37 C) (Oral)  Resp 20  Ht '5\' 2"'$  (1.575 m)  Wt 207 lb (93.895 kg)  BMI 37.85 kg/m2  SpO2 98%     Review of Systems  Constitutional: Negative.   HENT: Negative for congestion, dental problem, hearing loss, rhinorrhea, sinus pressure, sore throat and tinnitus.   Eyes: Negative for pain, discharge and visual disturbance.  Respiratory: Negative for cough and shortness of breath.     Cardiovascular: Negative for chest pain, palpitations and leg swelling.  Gastrointestinal: Negative for nausea, vomiting, abdominal pain, diarrhea, constipation, blood in stool and abdominal distention.  Genitourinary: Negative for dysuria, urgency, frequency, hematuria, flank pain, vaginal bleeding, vaginal discharge, difficulty urinating, vaginal pain and pelvic pain.  Musculoskeletal: Negative for joint swelling, arthralgias and gait problem.  Skin: Negative for rash.  Neurological: Negative for dizziness, syncope, speech difficulty, weakness, numbness and headaches.  Hematological: Negative for adenopathy.  Psychiatric/Behavioral: Negative for behavioral problems, dysphoric mood and agitation. The patient is not nervous/anxious.        Objective:   Physical Exam  Constitutional: She appears well-developed and well-nourished. No distress.  Blood pressure 140/80          Assessment & Plan:   Diabetes mellitus.  Poor control.  Will modestly increase basal insulin to 40 units; will intensify prandial insulin.  Detailed written instructions dispensed  Return in 4 weeks for follow-up

## 2016-02-15 NOTE — Patient Instructions (Signed)
Increase bedtime dose of insulin to 40 units  Humalog  15 units prior to breakfast 20 units prior to lunch 25 units prior to your evening meal  If blood sugar before your meals are greater than 200, add an additional 5 units

## 2016-02-19 ENCOUNTER — Other Ambulatory Visit: Payer: Self-pay | Admitting: *Deleted

## 2016-02-19 ENCOUNTER — Ambulatory Visit: Payer: Self-pay | Admitting: *Deleted

## 2016-02-29 ENCOUNTER — Other Ambulatory Visit (HOSPITAL_BASED_OUTPATIENT_CLINIC_OR_DEPARTMENT_OTHER): Payer: Commercial Managed Care - HMO

## 2016-02-29 ENCOUNTER — Encounter: Payer: Self-pay | Admitting: *Deleted

## 2016-02-29 ENCOUNTER — Telehealth: Payer: Self-pay | Admitting: Nurse Practitioner

## 2016-02-29 ENCOUNTER — Ambulatory Visit (HOSPITAL_BASED_OUTPATIENT_CLINIC_OR_DEPARTMENT_OTHER): Payer: Commercial Managed Care - HMO

## 2016-02-29 ENCOUNTER — Ambulatory Visit (HOSPITAL_BASED_OUTPATIENT_CLINIC_OR_DEPARTMENT_OTHER): Payer: Commercial Managed Care - HMO | Admitting: Nurse Practitioner

## 2016-02-29 ENCOUNTER — Telehealth: Payer: Self-pay | Admitting: Internal Medicine

## 2016-02-29 VITALS — BP 150/62 | HR 70 | Temp 98.1°F | Resp 18 | Ht 62.0 in | Wt 208.3 lb

## 2016-02-29 DIAGNOSIS — C9501 Acute leukemia of unspecified cell type, in remission: Secondary | ICD-10-CM

## 2016-02-29 DIAGNOSIS — Z452 Encounter for adjustment and management of vascular access device: Secondary | ICD-10-CM | POA: Diagnosis not present

## 2016-02-29 DIAGNOSIS — E119 Type 2 diabetes mellitus without complications: Secondary | ICD-10-CM

## 2016-02-29 DIAGNOSIS — I4891 Unspecified atrial fibrillation: Secondary | ICD-10-CM

## 2016-02-29 DIAGNOSIS — D61810 Antineoplastic chemotherapy induced pancytopenia: Secondary | ICD-10-CM | POA: Diagnosis not present

## 2016-02-29 DIAGNOSIS — N289 Disorder of kidney and ureter, unspecified: Secondary | ICD-10-CM | POA: Diagnosis not present

## 2016-02-29 DIAGNOSIS — C9200 Acute myeloblastic leukemia, not having achieved remission: Secondary | ICD-10-CM

## 2016-02-29 DIAGNOSIS — C9251 Acute myelomonocytic leukemia, in remission: Secondary | ICD-10-CM

## 2016-02-29 DIAGNOSIS — Z95828 Presence of other vascular implants and grafts: Secondary | ICD-10-CM

## 2016-02-29 LAB — COMPREHENSIVE METABOLIC PANEL
ALT: 14 U/L (ref 0–55)
ANION GAP: 8 meq/L (ref 3–11)
AST: 19 U/L (ref 5–34)
Albumin: 3.3 g/dL — ABNORMAL LOW (ref 3.5–5.0)
Alkaline Phosphatase: 82 U/L (ref 40–150)
BILIRUBIN TOTAL: 0.46 mg/dL (ref 0.20–1.20)
BUN: 30.7 mg/dL — ABNORMAL HIGH (ref 7.0–26.0)
CALCIUM: 9.1 mg/dL (ref 8.4–10.4)
CO2: 25 mEq/L (ref 22–29)
CREATININE: 1.7 mg/dL — AB (ref 0.6–1.1)
Chloride: 109 mEq/L (ref 98–109)
EGFR: 30 mL/min/{1.73_m2} — ABNORMAL LOW (ref >90)
Glucose: 226 mg/dl — ABNORMAL HIGH (ref 70–140)
Potassium: 4.1 mEq/L (ref 3.5–5.1)
Sodium: 142 mEq/L (ref 136–145)
TOTAL PROTEIN: 6.3 g/dL — AB (ref 6.4–8.3)

## 2016-02-29 LAB — CBC WITH DIFFERENTIAL/PLATELET
BASO%: 0.4 % (ref 0.0–2.0)
BASOS ABS: 0 10*3/uL (ref 0.0–0.1)
EOS ABS: 0.2 10*3/uL (ref 0.0–0.5)
EOS%: 2.8 % (ref 0.0–7.0)
HEMATOCRIT: 35.6 % (ref 34.8–46.6)
HEMOGLOBIN: 11.6 g/dL (ref 11.6–15.9)
LYMPH#: 1.5 10*3/uL (ref 0.9–3.3)
LYMPH%: 24.5 % (ref 14.0–49.7)
MCH: 30.9 pg (ref 25.1–34.0)
MCHC: 32.5 g/dL (ref 31.5–36.0)
MCV: 95.3 fL (ref 79.5–101.0)
MONO#: 0.5 10*3/uL (ref 0.1–0.9)
MONO%: 8.3 % (ref 0.0–14.0)
NEUT#: 4 10*3/uL (ref 1.5–6.5)
NEUT%: 64 % (ref 38.4–76.8)
PLATELETS: 129 10*3/uL — AB (ref 145–400)
RBC: 3.74 10*6/uL (ref 3.70–5.45)
RDW: 13.7 % (ref 11.2–14.5)
WBC: 6.2 10*3/uL (ref 3.9–10.3)

## 2016-02-29 LAB — MAGNESIUM: MAGNESIUM: 2.1 mg/dL (ref 1.5–2.5)

## 2016-02-29 LAB — PROTIME-INR
INR: 1.1 — AB (ref 2.00–3.50)
Protime: 13.2 Seconds (ref 10.6–13.4)

## 2016-02-29 MED ORDER — HEPARIN SOD (PORK) LOCK FLUSH 100 UNIT/ML IV SOLN
500.0000 [IU] | Freq: Once | INTRAVENOUS | Status: AC
  Administered 2016-02-29: 500 [IU] via INTRAVENOUS
  Filled 2016-02-29: qty 5

## 2016-02-29 MED ORDER — SODIUM CHLORIDE 0.9% FLUSH
10.0000 mL | INTRAVENOUS | Status: DC | PRN
  Administered 2016-02-29: 10 mL via INTRAVENOUS
  Filled 2016-02-29: qty 10

## 2016-02-29 NOTE — Telephone Encounter (Signed)
Pt call to ask if youhave any samples of the following med Insulin Lispro (HUMALOG KWIKPEN) 200 UNIT/ML SOPN

## 2016-02-29 NOTE — Patient Outreach (Signed)
[facility]) Care Management  [facility] Care Manager  Late entry 02/29/2016  [name] Sep 03, 1941 [id number]  Subjective: [place] telephone call to patient.  [employer] compliance verified. Per patient she does not much about diabetes. Patient knows the symptoms of hypoglycemia but had never experienced  hyperglycemia. Per patient she just knew she needed to eat. Per patient her blood sugars have been ranging from 148-270.Patient is on insulin and oral agents. Per patient she is in remission from the leukemia. Patient is wanting to get her A1C from 8.5 to 7.  Patient has agreed to follow up outreach calls.     Objective:   Current Medications:  Current Outpatient Prescriptions  Medication Sig Dispense Refill  . amLODipine (NORVASC) 5 MG tablet TAKE 1 TABLET EVERY DAY 90 tablet 1  . aspirin EC 325 MG tablet Take 325 mg by mouth daily.    . Blood Glucose Monitoring Suppl (TRUE METRIX AIR GLUCOSE METER) w/Device KIT     . furosemide (LASIX) 40 MG tablet TAKE 1 TABLET (40 MG TOTAL) BY MOUTH DAILY. 90 tablet 1  . HYDROcodone-acetaminophen (NORCO/VICODIN) 5-325 MG tablet Take 1 tablet by mouth every 6 (six) hours as needed for moderate pain. 90 tablet 0  . Insulin Glargine (TOUJEO SOLOSTAR) 300 UNIT/ML SOPN Inject 40 Units into the skin at bedtime. 1 pen 6  . Insulin Lispro (HUMALOG KWIKPEN) 200 UNIT/ML SOPN Inject 15 Units into the skin 3 (three) times daily before meals. 15 units prior to breakfast, 20 units prior to lunch , 25 units prior to your evening meal; If blood sugar greater than 200, add an additional 5 units 3 pen 4  . lisinopril (PRINIVIL,ZESTRIL) 10 MG tablet Take 1 tablet (10 mg total) by mouth daily. 90 tablet 3  . LORazepam (ATIVAN) 0.5 MG tablet Take 1 tablet (0.5 mg total) by mouth every 6 (six) hours as needed for anxiety. 90 tablet 5  . magnesium oxide (MAG-OX) 400 (241.3 Mg) MG tablet Take 2 tablets (800 mg total) by mouth 2 (two) times daily. 360  tablet 3  . metFORMIN (GLUCOPHAGE) 500 MG tablet Take 1 tablet (500 mg total) by mouth 2 (two) times daily with a meal. 180 tablet 3  . metoprolol (LOPRESSOR) 50 MG tablet TAKE 1 TABLET TWICE DAILY 180 tablet 1  . nitroGLYCERIN (NITROSTAT) 0.4 MG SL tablet Place 1 tablet (0.4 mg total) under the tongue every 5 (five) minutes as needed for chest pain. 90 tablet 1  . promethazine (PHENERGAN) 12.5 MG tablet Take 2 tablets (25 mg total) by mouth every 6 (six) hours as needed for nausea. 12 tablet 0  . simvastatin (ZOCOR) 20 MG tablet TAKE 1 TABLET EVERY EVENING 90 tablet 1  . TRUE METRIX BLOOD GLUCOSE TEST test strip      No current facility-administered medications for this visit.   Facility-Administered Medications Ordered in Other Visits  Medication Dose Route Frequency Provider Last Rate Last Dose  . sodium chloride 0.9 % 1,000 mL with magnesium sulfate 2 g infusion   Intravenous Continuous Ladell Pier, MD   Stopped at 08/04/14 1441    Functional Status:  In your present state of health, do you have any difficulty performing the following activities: 02/19/2016  Hearing? N  Vision? N  Difficulty concentrating or making decisions? N  Walking or climbing stairs? N  Dressing or bathing? N  Doing errands, shopping? N    Fall/Depression Screening: PHQ 2/9 Scores 02/19/2016 11/29/2015 04/17/2015 10/31/2013  PHQ -  2 Score 0 0 0 0   THN CM Care Plan Problem One        Most Recent Value   Care Plan Problem One  Knowledge deficit in self management of diabetes   Role Documenting the Problem One  [place] for Problem One  Active   THN Long Term Goal (31-90 days)  patient A1C will decrease by one point within the next 90 days   THN Long Term Goal Start Date  02/19/16   Interventions for Problem One [place] sent patient educational information on How the A1C tests helps, Your A1c goal, The A1C test, Controlling your blood sugar.. Rn will have have  follow up discussion and teachback   THN CM Short Term Goal #1 (0-30 days)  Patient will be able to tell the signs and symptoms with action plan for Hypoglycemia and hyperglycemia wiithin 30 days   THN CM Short Term Goal #1 Start Date  02/19/16   Interventions for Short Term Goal #1  RN will send patient a picture chart of symptoms of hyper and hypoglycemia. RN will send EMMI information on hypo and hyperglycemia. RN will  send information on treating high and low blood glucose. RN will send information on sick day plan   THN CM Short Term Goal #2 (0-30 days)  Patient will be able to verbalize foods to eat for a healthy snack within 30 days   THN CM Short Term Goal #2 Start Date  02/19/16   Interventions for Short Term Goal #2  RN will send patient a healthy snack list. RN will have a follow up discussion and teach back      Assessment:  Patient will benefit from Health Coach outreach for education and support for diabetes self management  Plan:  RN sent picture chart Hypo and Hyperglycemia signs and symptoms RN sent  EMMI written educational information on hypo and hyperglycemia RN send educational material on treating high and low blood glucose RN sent EMMI information on what to do on sick days with diabetes RN sent 2017 calendar Book RN sent Healthy snack list RN sent Emmi information on A1C RN will send educational material on track your readings and what it tells RN will follow up within a month with discussion and teach back  [place] Management [phone]

## 2016-02-29 NOTE — Telephone Encounter (Signed)
per pof to sch pt appt-gave pt copy of avs °

## 2016-02-29 NOTE — Progress Notes (Signed)
  [facility] OFFICE PROGRESS NOTE   Diagnosis:  AML  INTERVAL HISTORY:   [name] returns as scheduled. She feels well. No interim illnesses or infections. No fevers or sweats. She reports a good appetite. No bleeding. No rash. No unusual headaches. No vision change. She has stable mild dyspnea on exertion. No cough. No chest pain. No leg swelling or calf pain. She has intermittent numbness in both hands.  Objective:  Vital signs in last 24 hours:  Blood pressure 150/62, pulse 70, temperature 98.1 F (36.7 C), temperature source Oral, resp. rate 18, height 5\' 2"  (1.575 m), weight 208 lb 4.8 oz (94.484 kg), SpO2 99 %.    HEENT: No thrush or ulcers. Lymphatics: No palpable cervical or supraclavicular lymph nodes. Resp: Lungs clear bilaterally. Cardio: Distant heart sounds. GI: Abdomen soft and nontender. No organomegaly. Vascular: Trace lower leg edema bilaterally. Port-A-Cath without erythema.   Lab Results:  Lab Results  Component Value Date   WBC 6.2 02/29/2016   HGB 11.6 02/29/2016   HCT 35.6 02/29/2016   MCV 95.3 02/29/2016   PLT 129* 02/29/2016   NEUTROABS 4.0 02/29/2016    Imaging:  No results found.  Medications: I have reviewed the patient's current medications.  Assessment/Plan: 1. 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  Status post cycle 3 high-dose araC consolidation 08/22/2014.  Bone marrow 09/15/2014 showed necrotic marrow with minute focus of myeloid precursors. No evidence of viable acute leukemia. 2. Pancytopenia secondary to chemotherapy. Improved.  3. C. difficile  colitis-06/09/2014 treated with Flagyl  4. Diabetes  5. renal insufficiency  6. history of hypokalemia and hypomagnesemia. 7. history of atrial fibrillation  9. history of coronary artery disease  10. Hospitalization with febrile neutropenia/sepsis secondary to Escherichia coli bacteremia 09/02/2014. 11. Shingles involving the right back October 2015. 12. Meningioma on brain CT 05/24/2014 and brain MRI 05/24/2014   Disposition: [name] remains in clinical remission from AML. She will see Dr. Florene Glen in 6 weeks. We scheduled a return visit here with a Port-A-Cath flush in 12 weeks. She will contact the office in the interim with any problems.  Plan reviewed with Dr. [name].  Ned Card ANP/GNP-BC   02/29/2016  12:04 PM

## 2016-02-29 NOTE — Patient Instructions (Signed)

## 2016-02-29 NOTE — Telephone Encounter (Signed)
Spoke to pt, told her I do have a sample of Humalog Kwikpen 200 u/ml you can come by and pickup. Pt verbalized understanding and said she will be by later today. Told her okay.

## 2016-03-18 ENCOUNTER — Encounter: Payer: Self-pay | Admitting: Internal Medicine

## 2016-03-18 ENCOUNTER — Ambulatory Visit (INDEPENDENT_AMBULATORY_CARE_PROVIDER_SITE_OTHER): Payer: Commercial Managed Care - HMO | Admitting: Internal Medicine

## 2016-03-18 VITALS — BP 140/70 | HR 81 | Temp 99.1°F | Resp 20 | Ht 62.0 in | Wt 208.0 lb

## 2016-03-18 DIAGNOSIS — E0821 Diabetes mellitus due to underlying condition with diabetic nephropathy: Secondary | ICD-10-CM

## 2016-03-18 DIAGNOSIS — I10 Essential (primary) hypertension: Secondary | ICD-10-CM

## 2016-03-18 MED ORDER — HYDROCODONE-ACETAMINOPHEN 5-325 MG PO TABS: 1.0000 | ORAL_TABLET | Freq: Four times a day (QID) | ORAL | Status: DC | PRN

## 2016-03-18 MED ORDER — INSULIN LISPRO 100 UNIT/ML (KWIKPEN): PEN_INJECTOR | SUBCUTANEOUS | Status: DC

## 2016-03-18 NOTE — Patient Instructions (Signed)
Limit your sodium (Salt) intake   Please check your hemoglobin A1c every 3 months   

## 2016-03-18 NOTE — Progress Notes (Signed)
Pre visit review using our clinic review tool, if applicable. No additional management support is needed unless otherwise documented below in the visit note. 

## 2016-03-18 NOTE — Progress Notes (Signed)
Subjective:    Patient ID: [name], female    DOB: 1941/11/05, 75 y.o.   MRN: [id number]  HPI 75 year old patient who is seen today for follow-up of diabetes. Presently she is on basal insulin 40 units.  Fasting blood sugars are generally between 100-120. She is taking 16 units prior to breakfast and 20 units prior to her evening meal.  She takes 20 units prior to lunch only sporadically hearing low blood sugars.  She states that blood sugars prior to lunch are occasionally in the eighties and blood sugars prior to bedtime are also occasionally in the eighties.  No hypoglycemia  Past Medical History  Diagnosis Date  . DIABETES MELLITUS, TYPE II 07/13/2007  . HYPERLIPIDEMIA 12/24/2007  . HYPERTENSION 07/13/2007  . CORONARY ARTERY DISEASE 12/24/2007  . NEPHROLITHIASIS, HX OF 12/24/2007  . Colon polyps 04/29/2010    TUBULAR ADENOMA AND A SERRATED ADENOMA  . Acute pancreatitis   . Unspecified disease of pancreas   . Chronic kidney disease, stage II (mild)   . acute myeloblastic leukemia (AML) dx'd 06/2014  . Blood transfusion without reported diagnosis 2015    Leukemia    Social History   Social History  . Marital Status: Married    Spouse Name: N/A  . Number of Children: N/A  . Years of Education: N/A   Occupational History  . Not on file.   Social History Main Topics  . Smoking status: Former Smoker    Types: Cigarettes    Quit date: 12/08/2000  . Smokeless tobacco: Never Used  . Alcohol Use: No  . Drug Use: No  . Sexual Activity: Not on file   Other Topics Concern  . Not on file   Social History Narrative    Past Surgical History  Procedure Laterality Date  . Excisional hemorrhoidectomy  unknown  . Abdominal hysterectomy  unknown  . Nasal sinus surgery  unknown  . Cardiac catheterization  2002  . Cataract extraction Bilateral   . Colonoscopy  2011  . Polypectomy  2011    Family History  Problem Relation Age of Onset  . Cancer Father     throat ca  .  COPD Father   . Cancer Sister     uterine ca and breast ca  . Cancer Brother     throat  . Colon cancer Neg Hx     No Known Allergies  Current Outpatient Prescriptions on File Prior to Visit  Medication Sig Dispense Refill  . amLODipine (NORVASC) 5 MG tablet TAKE 1 TABLET EVERY DAY 90 tablet 1  . aspirin EC 325 MG tablet Take 325 mg by mouth daily.    . Blood Glucose Monitoring Suppl (TRUE METRIX AIR GLUCOSE METER) w/Device KIT     . furosemide (LASIX) 40 MG tablet TAKE 1 TABLET (40 MG TOTAL) BY MOUTH DAILY. 90 tablet 1  . Insulin Glargine (TOUJEO SOLOSTAR) 300 UNIT/ML SOPN Inject 40 Units into the skin at bedtime. 1 pen 6  . lisinopril (PRINIVIL,ZESTRIL) 10 MG tablet Take 1 tablet (10 mg total) by mouth daily. 90 tablet 3  . LORazepam (ATIVAN) 0.5 MG tablet Take 1 tablet (0.5 mg total) by mouth every 6 (six) hours as needed for anxiety. 90 tablet 5  . magnesium oxide (MAG-OX) 400 (241.3 Mg) MG tablet Take 2 tablets (800 mg total) by mouth 2 (two) times daily. 360 tablet 3  . metFORMIN (GLUCOPHAGE) 500 MG tablet Take 1 tablet (500 mg total) by mouth 2 (two) times  daily with a meal. 180 tablet 3  . metoprolol (LOPRESSOR) 50 MG tablet TAKE 1 TABLET TWICE DAILY 180 tablet 1  . nitroGLYCERIN (NITROSTAT) 0.4 MG SL tablet Place 1 tablet (0.4 mg total) under the tongue every 5 (five) minutes as needed for chest pain. 90 tablet 1  . promethazine (PHENERGAN) 12.5 MG tablet Take 2 tablets (25 mg total) by mouth every 6 (six) hours as needed for nausea. 12 tablet 0  . simvastatin (ZOCOR) 20 MG tablet TAKE 1 TABLET EVERY EVENING 90 tablet 1  . TRUE METRIX BLOOD GLUCOSE TEST test strip      Current Facility-Administered Medications on File Prior to Visit  Medication Dose Route Frequency Provider Last Rate Last Dose  . sodium chloride 0.9 % 1,000 mL with magnesium sulfate 2 g infusion   Intravenous Continuous Ladell Pier, MD   Stopped at 08/04/14 1441    BP 140/70 mmHg  Pulse 81  Temp(Src)  99.1 F (37.3 C) (Oral)  Resp 20  Ht '5\' 2"'$  (1.575 m)  Wt 208 lb (94.348 kg)  BMI 38.03 kg/m2  SpO2 97%      Review of Systems  Eyes: Negative for visual disturbance.  Endocrine: Negative for polydipsia, polyphagia and polyuria.       Objective:   Physical Exam  Constitutional: She appears well-developed and well-nourished. No distress.          Assessment & Plan:  Diabetes mellitus.  Better controlled.  Compliance with the mealtime insulin prior to lunch.  Encouraged.  Will continue present basal insulin dose of 40 units We'll change.  Prandial insulin to 12 units prior to breakfast and lunch and 16 units prior to her evening meal  Recheck 3 months or as needed

## 2016-03-19 ENCOUNTER — Ambulatory Visit: Payer: Self-pay | Admitting: *Deleted

## 2016-03-27 ENCOUNTER — Ambulatory Visit: Payer: Self-pay | Admitting: *Deleted

## 2016-03-31 ENCOUNTER — Ambulatory Visit: Payer: Self-pay | Admitting: *Deleted

## 2016-04-01 ENCOUNTER — Other Ambulatory Visit: Payer: Self-pay | Admitting: *Deleted

## 2016-04-01 ENCOUNTER — Ambulatory Visit: Payer: Self-pay | Admitting: *Deleted

## 2016-04-01 NOTE — Patient Outreach (Signed)
[facility]) Care Management  04/01/2016  [name] Dec 19, 1940 [id number]   RN Health Coach  attempted  #1  Follow up outreach call to patient.  Patient was unavailable. HIPPA compliance voicemail message was left with return callback number.    [facility] Care Management [phone]

## 2016-04-14 ENCOUNTER — Telehealth: Payer: Self-pay | Admitting: Internal Medicine

## 2016-04-14 NOTE — Telephone Encounter (Signed)
Error/ltd ° °

## 2016-04-22 ENCOUNTER — Other Ambulatory Visit: Payer: Self-pay | Admitting: Internal Medicine

## 2016-04-22 DIAGNOSIS — I129 Hypertensive chronic kidney disease with stage 1 through stage 4 chronic kidney disease, or unspecified chronic kidney disease: Secondary | ICD-10-CM | POA: Diagnosis not present

## 2016-04-22 DIAGNOSIS — B9789 Other viral agents as the cause of diseases classified elsewhere: Secondary | ICD-10-CM | POA: Diagnosis not present

## 2016-04-22 DIAGNOSIS — I480 Paroxysmal atrial fibrillation: Secondary | ICD-10-CM | POA: Diagnosis not present

## 2016-04-22 DIAGNOSIS — N183 Chronic kidney disease, stage 3 (moderate): Secondary | ICD-10-CM | POA: Diagnosis not present

## 2016-04-22 DIAGNOSIS — J069 Acute upper respiratory infection, unspecified: Secondary | ICD-10-CM | POA: Diagnosis not present

## 2016-04-22 DIAGNOSIS — E1122 Type 2 diabetes mellitus with diabetic chronic kidney disease: Secondary | ICD-10-CM | POA: Diagnosis not present

## 2016-04-22 DIAGNOSIS — C9201 Acute myeloblastic leukemia, in remission: Secondary | ICD-10-CM | POA: Diagnosis not present

## 2016-04-22 DIAGNOSIS — N179 Acute kidney failure, unspecified: Secondary | ICD-10-CM | POA: Diagnosis not present

## 2016-04-28 ENCOUNTER — Telehealth: Payer: Self-pay | Admitting: Internal Medicine

## 2016-04-28 ENCOUNTER — Encounter: Payer: Self-pay | Admitting: Internal Medicine

## 2016-04-28 ENCOUNTER — Ambulatory Visit (INDEPENDENT_AMBULATORY_CARE_PROVIDER_SITE_OTHER): Payer: Commercial Managed Care - HMO | Admitting: Internal Medicine

## 2016-04-28 VITALS — BP 112/56 | HR 66 | Temp 98.4°F | Ht 62.0 in | Wt 202.0 lb

## 2016-04-28 DIAGNOSIS — C9201 Acute myeloblastic leukemia, in remission: Secondary | ICD-10-CM

## 2016-04-28 DIAGNOSIS — J209 Acute bronchitis, unspecified: Secondary | ICD-10-CM

## 2016-04-28 DIAGNOSIS — I10 Essential (primary) hypertension: Secondary | ICD-10-CM

## 2016-04-28 DIAGNOSIS — J441 Chronic obstructive pulmonary disease with (acute) exacerbation: Secondary | ICD-10-CM | POA: Diagnosis not present

## 2016-04-28 DIAGNOSIS — E119 Type 2 diabetes mellitus without complications: Secondary | ICD-10-CM

## 2016-04-28 DIAGNOSIS — Z794 Long term (current) use of insulin: Secondary | ICD-10-CM

## 2016-04-28 DIAGNOSIS — E0821 Diabetes mellitus due to underlying condition with diabetic nephropathy: Secondary | ICD-10-CM | POA: Diagnosis not present

## 2016-04-28 MED ORDER — AZITHROMYCIN 250 MG PO TABS: ORAL_TABLET | ORAL | Status: DC

## 2016-04-28 NOTE — Telephone Encounter (Signed)
Pt scheduled by Triage Nurse

## 2016-04-28 NOTE — Patient Instructions (Signed)
Take your antibiotic as prescribed until ALL of it is gone, but stop if you develop a rash, swelling, or any side effects of the medication.  Contact our office as soon as possible if  there are side effects of the medication.   Take over-the-counter expectorants and cough medications such as  Mucinex DM.  Call if there is no improvement in 5 to 7 days or if  you develop worsening cough, fever, or new symptoms, such as shortness of breath or chest pain.  Please check with [employer] for a patient assistant program   Please check your hemoglobin A1c every 3 months

## 2016-04-28 NOTE — Telephone Encounter (Addendum)
Pt would like to have a Zpac coughing up green mucus. Sent to Triage nurse.

## 2016-04-28 NOTE — Progress Notes (Signed)
Subjective:    Patient ID: [name], female    DOB: 22-Oct-1941, 75 y.o.   MRN: [id number]  HPI  75 year old patient who presents with a five-day history of chest congestion and productive cough.  She has developed a sputum described as green.  She has history of diabetes, chronic kidney disease, and AML in remission.  Denies any fever or chills.  She remains on basal insulin, but she is no longer taking mealtime insulin.  She states that she has no insurance coverage.  Lab Results  Component Value Date   HGBA1C 8.5* 11/23/2015     Past Medical History  Diagnosis Date  . DIABETES MELLITUS, TYPE II 07/13/2007  . HYPERLIPIDEMIA 12/24/2007  . HYPERTENSION 07/13/2007  . CORONARY ARTERY DISEASE 12/24/2007  . NEPHROLITHIASIS, HX OF 12/24/2007  . Colon polyps 04/29/2010    TUBULAR ADENOMA AND A SERRATED ADENOMA  . Acute pancreatitis   . Unspecified disease of pancreas   . Chronic kidney disease, stage II (mild)   . acute myeloblastic leukemia (AML) dx'd 06/2014  . Blood transfusion without reported diagnosis 2015    Leukemia     Social History   Social History  . Marital Status: Married    Spouse Name: N/A  . Number of Children: N/A  . Years of Education: N/A   Occupational History  . Not on file.   Social History Main Topics  . Smoking status: Former Smoker    Types: Cigarettes    Quit date: 12/08/2000  . Smokeless tobacco: Never Used  . Alcohol Use: No  . Drug Use: No  . Sexual Activity: Not on file   Other Topics Concern  . Not on file   Social History Narrative    Past Surgical History  Procedure Laterality Date  . Excisional hemorrhoidectomy  unknown  . Abdominal hysterectomy  unknown  . Nasal sinus surgery  unknown  . Cardiac catheterization  2002  . Cataract extraction Bilateral   . Colonoscopy  2011  . Polypectomy  2011    Family History  Problem Relation Age of Onset  . Cancer Father     throat ca  . COPD Father   . Cancer Sister     uterine  ca and breast ca  . Cancer Brother     throat  . Colon cancer Neg Hx     No Known Allergies  Current Outpatient Prescriptions on File Prior to Visit  Medication Sig Dispense Refill  . amLODipine (NORVASC) 5 MG tablet TAKE 1 TABLET EVERY DAY 90 tablet 1  . aspirin EC 325 MG tablet Take 325 mg by mouth daily.    . Blood Glucose Monitoring Suppl (TRUE METRIX AIR GLUCOSE METER) w/Device KIT     . furosemide (LASIX) 40 MG tablet TAKE 1 TABLET EVERY DAY 90 tablet 1  . HYDROcodone-acetaminophen (NORCO/VICODIN) 5-325 MG tablet Take 1 tablet by mouth every 6 (six) hours as needed for moderate pain. 90 tablet 0  . Insulin Glargine (TOUJEO SOLOSTAR) 300 UNIT/ML SOPN Inject 40 Units into the skin at bedtime. 1 pen 6  . insulin lispro (HUMALOG KWIKPEN) 100 UNIT/ML KiwkPen 12 units prior to breakfast and lunch and 16 units prior to your evening meal 15 mL 11  . lisinopril (PRINIVIL,ZESTRIL) 10 MG tablet Take 1 tablet (10 mg total) by mouth daily. 90 tablet 3  . LORazepam (ATIVAN) 0.5 MG tablet Take 1 tablet (0.5 mg total) by mouth every 6 (six) hours as needed for anxiety. 90 tablet  5  . magnesium oxide (MAG-OX) 400 (241.3 Mg) MG tablet Take 2 tablets (800 mg total) by mouth 2 (two) times daily. 360 tablet 3  . metFORMIN (GLUCOPHAGE) 500 MG tablet Take 1 tablet (500 mg total) by mouth 2 (two) times daily with a meal. 180 tablet 3  . metoprolol (LOPRESSOR) 50 MG tablet TAKE 1 TABLET TWICE DAILY 180 tablet 1  . nitroGLYCERIN (NITROSTAT) 0.4 MG SL tablet Place 1 tablet (0.4 mg total) under the tongue every 5 (five) minutes as needed for chest pain. 90 tablet 1  . promethazine (PHENERGAN) 12.5 MG tablet Take 2 tablets (25 mg total) by mouth every 6 (six) hours as needed for nausea. 12 tablet 0  . simvastatin (ZOCOR) 20 MG tablet TAKE 1 TABLET EVERY EVENING 90 tablet 1  . TRUE METRIX BLOOD GLUCOSE TEST test strip      Current Facility-Administered Medications on File Prior to Visit  Medication Dose Route  Frequency Provider Last Rate Last Dose  . sodium chloride 0.9 % 1,000 mL with magnesium sulfate 2 g infusion   Intravenous Continuous Ladell Pier, MD   Stopped at 08/04/14 1441    BP 112/56 mmHg  Pulse 66  Temp(Src) 98.4 F (36.9 C) (Oral)  Ht 5' 2" (1.575 m)  Wt 202 lb (91.627 kg)  BMI 36.94 kg/m2  SpO2 96%      Review of Systems  Constitutional: Positive for activity change, appetite change and fatigue. Negative for fever, chills and diaphoresis.  HENT: Negative for congestion, dental problem, hearing loss, rhinorrhea, sinus pressure, sore throat and tinnitus.   Eyes: Negative for pain, discharge and visual disturbance.  Respiratory: Positive for cough. Negative for shortness of breath.   Cardiovascular: Negative for chest pain, palpitations and leg swelling.  Gastrointestinal: Negative for nausea, vomiting, abdominal pain, diarrhea, constipation, blood in stool and abdominal distention.  Genitourinary: Negative for dysuria, urgency, frequency, hematuria, flank pain, vaginal bleeding, vaginal discharge, difficulty urinating, vaginal pain and pelvic pain.  Musculoskeletal: Negative for joint swelling, arthralgias and gait problem.  Skin: Negative for rash.  Neurological: Positive for weakness. Negative for dizziness, syncope, speech difficulty, numbness and headaches.  Hematological: Negative for adenopathy.  Psychiatric/Behavioral: Negative for behavioral problems, dysphoric mood and agitation. The patient is not nervous/anxious.        Objective:   Physical Exam  Constitutional: She is oriented to person, place, and time. She appears well-developed and well-nourished. No distress.  HENT:  Head: Normocephalic.  Right Ear: External ear normal.  Left Ear: External ear normal.  Mouth/Throat: Oropharynx is clear and moist.  Eyes: Conjunctivae and EOM are normal. Pupils are equal, round, and reactive to light.  Neck: Normal range of motion. Neck supple. No thyromegaly  present.  Cardiovascular: Normal rate, regular rhythm, normal heart sounds and intact distal pulses.   Pulmonary/Chest: Effort normal and breath sounds normal. No respiratory distress. She has no wheezes. She has no rales.  Abdominal: Soft. Bowel sounds are normal. She exhibits no mass. There is no tenderness.  Musculoskeletal: Normal range of motion.  Lymphadenopathy:    She has no cervical adenopathy.  Neurological: She is alert and oriented to person, place, and time.  Skin: Skin is warm and dry. No rash noted.  Psychiatric: She has a normal mood and affect. Her behavior is normal.          Assessment & Plan:   Acute bronchitis.  High risk patient.  Will treat with azithromycin and expectorants Diabetes mellitus.  Samples of  Toujeo and Humalog dispensed.  Patient was told to check with her insurance company about coverage for mealtime insulin.  Patient was asked to check into the patient.  Assistant plans Essential hypertension, stable Chronic kidney disease).  These come and that's not appropriate at  [name], MD

## 2016-04-28 NOTE — Telephone Encounter (Signed)
Patient Name: [name]  DOB: 1941/04/18    Initial Comment Caller states coughing up thick green stuff    Nurse Assessment  Nurse: Raphael Gibney, RN, Vera Date/Time (Eastern Time): 04/28/2016 9:01:39 AM  Confirm and document reason for call. If symptomatic, describe symptoms. You must click the next button to save text entered. ---Caller states she has productive cough with green sputum for 4 days. Has been taking OTC medication which is not helping. No fever.  Has the patient traveled out of the country within the last 30 days? ---No  Does the patient have any new or worsening symptoms? ---Yes  Will a triage be completed? ---Yes  Related visit to physician within the last 2 weeks? ---No  Does the PT have any chronic conditions? (i.e. diabetes, asthma, etc.) ---Yes  List chronic conditions. ---diabetes, leukemia in remission  Is this a behavioral health or substance abuse call? ---No     Guidelines    Guideline Title Affirmed Question Affirmed Notes  Cough - Acute Productive SEVERE coughing spells (e.g., whooping sound after coughing, vomiting after coughing)    Final Disposition User   See Physician within 24 Hours Burley, RN, Vera    Comments  Appt scheduled for 11 am 04/28/2016 with Dr. Simonne Martinet   Referrals  REFERRED TO PCP OFFICE   Disagree/Comply: Comply

## 2016-04-28 NOTE — Telephone Encounter (Signed)
Pt scheduled on 04/28/16 to see Dr. Raliegh Ip

## 2016-05-02 ENCOUNTER — Other Ambulatory Visit: Payer: Self-pay | Admitting: *Deleted

## 2016-05-02 ENCOUNTER — Ambulatory Visit: Payer: Self-pay | Admitting: *Deleted

## 2016-05-02 NOTE — Patient Outreach (Signed)
[facility]) Care Management  05/02/2016  [name] July 24, 1941 [id number]   RN Health Coach attempted  Follow up outreach call to patient.  Patient was unavailable. Per Husband patient is not at home.   [facility] Care Management [phone]

## 2016-05-30 ENCOUNTER — Ambulatory Visit (HOSPITAL_BASED_OUTPATIENT_CLINIC_OR_DEPARTMENT_OTHER): Payer: Commercial Managed Care - HMO

## 2016-05-30 ENCOUNTER — Ambulatory Visit (HOSPITAL_BASED_OUTPATIENT_CLINIC_OR_DEPARTMENT_OTHER): Payer: Commercial Managed Care - HMO | Admitting: Oncology

## 2016-05-30 ENCOUNTER — Telehealth: Payer: Self-pay | Admitting: Oncology

## 2016-05-30 ENCOUNTER — Telehealth: Payer: Self-pay | Admitting: Internal Medicine

## 2016-05-30 ENCOUNTER — Other Ambulatory Visit (HOSPITAL_BASED_OUTPATIENT_CLINIC_OR_DEPARTMENT_OTHER): Payer: Commercial Managed Care - HMO

## 2016-05-30 VITALS — BP 157/55 | HR 79 | Temp 98.6°F | Resp 17 | Ht 62.0 in | Wt 204.6 lb

## 2016-05-30 DIAGNOSIS — E876 Hypokalemia: Secondary | ICD-10-CM

## 2016-05-30 DIAGNOSIS — C9251 Acute myelomonocytic leukemia, in remission: Secondary | ICD-10-CM | POA: Diagnosis not present

## 2016-05-30 DIAGNOSIS — D61810 Antineoplastic chemotherapy induced pancytopenia: Secondary | ICD-10-CM

## 2016-05-30 DIAGNOSIS — N289 Disorder of kidney and ureter, unspecified: Secondary | ICD-10-CM

## 2016-05-30 DIAGNOSIS — C9501 Acute leukemia of unspecified cell type, in remission: Secondary | ICD-10-CM

## 2016-05-30 DIAGNOSIS — E119 Type 2 diabetes mellitus without complications: Secondary | ICD-10-CM

## 2016-05-30 DIAGNOSIS — Z95828 Presence of other vascular implants and grafts: Secondary | ICD-10-CM

## 2016-05-30 DIAGNOSIS — Z452 Encounter for adjustment and management of vascular access device: Secondary | ICD-10-CM | POA: Diagnosis not present

## 2016-05-30 LAB — COMPREHENSIVE METABOLIC PANEL
ALBUMIN: 3.2 g/dL — AB (ref 3.5–5.0)
ALK PHOS: 96 U/L (ref 40–150)
ALT: 15 U/L (ref 0–55)
ANION GAP: 8 meq/L (ref 3–11)
AST: 13 U/L (ref 5–34)
BUN: 18 mg/dL (ref 7.0–26.0)
CALCIUM: 8.9 mg/dL (ref 8.4–10.4)
CHLORIDE: 106 meq/L (ref 98–109)
CO2: 26 mEq/L (ref 22–29)
CREATININE: 1.4 mg/dL — AB (ref 0.6–1.1)
EGFR: 36 mL/min/{1.73_m2} — ABNORMAL LOW (ref >90)
Glucose: 326 mg/dl — ABNORMAL HIGH (ref 70–140)
POTASSIUM: 4.8 meq/L (ref 3.5–5.1)
Sodium: 141 mEq/L (ref 136–145)
Total Bilirubin: 0.59 mg/dL (ref 0.20–1.20)
Total Protein: 6.2 g/dL — ABNORMAL LOW (ref 6.4–8.3)

## 2016-05-30 LAB — CBC WITH DIFFERENTIAL/PLATELET
BASO%: 0.2 % (ref 0.0–2.0)
BASOS ABS: 0 10*3/uL (ref 0.0–0.1)
EOS ABS: 0.1 10*3/uL (ref 0.0–0.5)
EOS%: 1.9 % (ref 0.0–7.0)
HEMATOCRIT: 32.5 % — AB (ref 34.8–46.6)
HEMOGLOBIN: 11 g/dL — AB (ref 11.6–15.9)
LYMPH#: 1.5 10*3/uL (ref 0.9–3.3)
LYMPH%: 28.9 % (ref 14.0–49.7)
MCH: 31.8 pg (ref 25.1–34.0)
MCHC: 33.8 g/dL (ref 31.5–36.0)
MCV: 93.9 fL (ref 79.5–101.0)
MONO#: 0.4 10*3/uL (ref 0.1–0.9)
MONO%: 7 % (ref 0.0–14.0)
NEUT#: 3.3 10*3/uL (ref 1.5–6.5)
NEUT%: 62 % (ref 38.4–76.8)
PLATELETS: 108 10*3/uL — AB (ref 145–400)
RBC: 3.46 10*6/uL — ABNORMAL LOW (ref 3.70–5.45)
RDW: 13.6 % (ref 11.2–14.5)
WBC: 5.3 10*3/uL (ref 3.9–10.3)
nRBC: 0 % (ref 0–0)

## 2016-05-30 LAB — MAGNESIUM: MAGNESIUM: 1.8 mg/dL (ref 1.5–2.5)

## 2016-05-30 MED ORDER — HEPARIN SOD (PORK) LOCK FLUSH 100 UNIT/ML IV SOLN
500.0000 [IU] | Freq: Once | INTRAVENOUS | Status: AC | PRN
  Administered 2016-05-30: 500 [IU] via INTRAVENOUS
  Filled 2016-05-30: qty 5

## 2016-05-30 MED ORDER — SODIUM CHLORIDE 0.9 % IJ SOLN
10.0000 mL | INTRAMUSCULAR | Status: DC | PRN
  Administered 2016-05-30: 10 mL via INTRAVENOUS
  Filled 2016-05-30: qty 10

## 2016-05-30 NOTE — Telephone Encounter (Signed)
Pt request refill  °HYDROcodone-acetaminophen (NORCO/VICODIN) 5-325 MG tablet °

## 2016-05-30 NOTE — Telephone Encounter (Signed)
Okay to refill Hydrocodone.  

## 2016-05-30 NOTE — Progress Notes (Signed)
  [place] OFFICE PROGRESS NOTE   Diagnosis: AML  INTERVAL HISTORY:   [name] returns as scheduled. She saw Dr. Florene Glen in May. She reports upper chest congestion. No fever. Mild dyspnea. She was treated with azithromycin last month. Her chief complaint is chronic back pain. The pain is worse with activity.  Objective:  Vital signs in last 24 hours:  Blood pressure 157/55, pulse 79, temperature 98.6 F (37 C), temperature source Oral, resp. rate 17, height 5\' 2"  (1.575 m), weight 204 lb 9.6 oz (92.806 kg), SpO2 96 %.    HEENT: Pharynx without exudate or erythema. No thrush Resp: Lungs clear bilaterally Cardio: Distant heart sounds, regular rate and rhythm GI: No hepatosplenomegaly Vascular: Trace pretibial edema bilaterally Port-A-Cath: No erythema  Lab Results:  Lab Results  Component Value Date   WBC 5.3 05/30/2016   HGB 11.0* 05/30/2016   HCT 32.5* 05/30/2016   MCV 93.9 05/30/2016   PLT 108* 05/30/2016   NEUTROABS 3.3 05/30/2016     Medications: I have reviewed the patient's current medications.  Assessment/Plan: 1. 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  Status post cycle 3 high-dose araC consolidation 08/22/2014.  Bone marrow 09/15/2014 showed necrotic marrow with minute focus of myeloid precursors. No evidence of viable acute leukemia. 2. Pancytopenia secondary to chemotherapy. Improved.  3. C. difficile colitis-06/09/2014 treated with Flagyl  4. Diabetes  5. renal insufficiency  6. history of hypokalemia and hypomagnesemia. 7. history of atrial fibrillation  9. history of coronary  artery disease  10. Hospitalization with febrile neutropenia/sepsis secondary to Escherichia coli bacteremia 09/02/2014. 11. Shingles involving the right back October 2015. 12. Meningioma on brain CT 05/24/2014 and brain MRI 05/24/2014    Disposition:  [name] remains in clinical remission from AML. She has persistent mild anemia and thrombocytopenia. She will see Dr. Florene Glen in 6 weeks and return for an office visit here in 12 weeks. I recommended she follow-up with her primary physician to evaluate the chronic back pain.  Catherine Coder, MD  05/30/2016  12:03 PM

## 2016-05-30 NOTE — Patient Instructions (Signed)

## 2016-05-30 NOTE — Telephone Encounter (Signed)
Gave and printed appt sched and avs for pt for Sept °

## 2016-05-30 NOTE — Telephone Encounter (Signed)
Okay to refill? 

## 2016-06-02 MED ORDER — HYDROCODONE-ACETAMINOPHEN 5-325 MG PO TABS: 1.0000 | ORAL_TABLET | Freq: Four times a day (QID) | ORAL | Status: DC | PRN

## 2016-06-02 NOTE — Telephone Encounter (Signed)
Pt notified Rx ready for pickup. Rx printed and signed.  

## 2016-06-03 ENCOUNTER — Other Ambulatory Visit: Payer: Self-pay | Admitting: *Deleted

## 2016-06-03 NOTE — Patient Outreach (Signed)
[facility]) Care Management  06/03/2016  [name] June 10, 1941 [id number]   RN Health  attempted  #3  outreach call to patient.  Patient stated she was eating dinner and expecting company. Patient stated she would call me back. Plan: RN will file for 10 days if no return call  I will send out an unsuccessful outreach letter and closure letter in 10 additional days   [place] Management [phone]

## 2016-06-16 ENCOUNTER — Encounter: Payer: Self-pay | Admitting: Internal Medicine

## 2016-06-16 ENCOUNTER — Ambulatory Visit (INDEPENDENT_AMBULATORY_CARE_PROVIDER_SITE_OTHER): Payer: Commercial Managed Care - HMO | Admitting: Internal Medicine

## 2016-06-16 VITALS — BP 136/60 | HR 81 | Temp 98.5°F | Resp 20 | Ht 62.0 in | Wt 202.0 lb

## 2016-06-16 DIAGNOSIS — N183 Chronic kidney disease, stage 3 unspecified: Secondary | ICD-10-CM

## 2016-06-16 DIAGNOSIS — I10 Essential (primary) hypertension: Secondary | ICD-10-CM

## 2016-06-16 DIAGNOSIS — Z794 Long term (current) use of insulin: Secondary | ICD-10-CM

## 2016-06-16 DIAGNOSIS — M545 Low back pain, unspecified: Secondary | ICD-10-CM

## 2016-06-16 DIAGNOSIS — E0821 Diabetes mellitus due to underlying condition with diabetic nephropathy: Secondary | ICD-10-CM

## 2016-06-16 MED ORDER — PIOGLITAZONE HCL 15 MG PO TABS: 15.0000 mg | ORAL_TABLET | Freq: Every day | ORAL | Status: DC

## 2016-06-16 MED ORDER — GLIPIZIDE ER 5 MG PO TB24: 5.0000 mg | ORAL_TABLET | Freq: Every day | ORAL | Status: DC

## 2016-06-16 MED ORDER — HYDROCODONE-ACETAMINOPHEN 5-325 MG PO TABS: 1.0000 | ORAL_TABLET | Freq: Four times a day (QID) | ORAL | Status: DC | PRN

## 2016-06-16 NOTE — Progress Notes (Signed)
Subjective:    Patient ID: [name], female    DOB: 1941/07/17, 75 y.o.   MRN: [id number]  HPI  Lab Results  Component Value Date   HGBA1C 8.5* 11/23/2015   75 year old patient who is seen today for follow-up of diabetes.  Presently she is on Toujeo 40 units at bedtime.  She has been unable to afford mealtime Humalog stating that it cost in excess of 300 dollars per month.  She is on submaximal dose of metformin due to chronic kidney disease. She strongly wishes to switch to oral medications if at all possible due to cost considerations. Fasting blood sugars are always greater than 200.  Recent laboratory screen 2 weeks ago revealed a random blood sugar in excess of 300.  The natural history of diabetes.  Discussed  Otherwise, doing quite well.  Followed closely by hematology locally and at [facility].  She has essential hypertension and has been on lisinopril. She has osteoarthritis and intermittent low back pain.  She is requesting a refill on her analgesics.  No eye examination in greater than 1 year  Past Medical History  Diagnosis Date  . DIABETES MELLITUS, TYPE II 07/13/2007  . HYPERLIPIDEMIA 12/24/2007  . HYPERTENSION 07/13/2007  . CORONARY ARTERY DISEASE 12/24/2007  . NEPHROLITHIASIS, HX OF 12/24/2007  . Colon polyps 04/29/2010    TUBULAR ADENOMA AND A SERRATED ADENOMA  . Acute pancreatitis   . Unspecified disease of pancreas   . Chronic kidney disease, stage II (mild)   . acute myeloblastic leukemia (AML) dx'd 06/2014  . Blood transfusion without reported diagnosis 2015    Leukemia     Social History   Social History  . Marital Status: Married    Spouse Name: N/A  . Number of Children: N/A  . Years of Education: N/A   Occupational History  . Not on file.   Social History Main Topics  . Smoking status: Former Smoker    Types: Cigarettes    Quit date: 12/08/2000  . Smokeless tobacco: Never Used  . Alcohol Use: No  . Drug Use: No  . Sexual Activity: Not  on file   Other Topics Concern  . Not on file   Social History Narrative    Past Surgical History  Procedure Laterality Date  . Excisional hemorrhoidectomy  unknown  . Abdominal hysterectomy  unknown  . Nasal sinus surgery  unknown  . Cardiac catheterization  2002  . Cataract extraction Bilateral   . Colonoscopy  2011  . Polypectomy  2011    Family History  Problem Relation Age of Onset  . Cancer Father     throat ca  . COPD Father   . Cancer Sister     uterine ca and breast ca  . Cancer Brother     throat  . Colon cancer Neg Hx     No Known Allergies  Current Outpatient Prescriptions on File Prior to Visit  Medication Sig Dispense Refill  . amLODipine (NORVASC) 5 MG tablet TAKE 1 TABLET EVERY DAY 90 tablet 1  . aspirin EC 325 MG tablet Take 325 mg by mouth daily.    . Blood Glucose Monitoring Suppl (TRUE METRIX AIR GLUCOSE METER) w/Device KIT     . furosemide (LASIX) 40 MG tablet TAKE 1 TABLET EVERY DAY 90 tablet 1  . Insulin Glargine (TOUJEO SOLOSTAR) 300 UNIT/ML SOPN Inject 40 Units into the skin at bedtime. 1 pen 6  . insulin lispro (HUMALOG KWIKPEN) 100 UNIT/ML KiwkPen 12  units prior to breakfast and lunch and 16 units prior to your evening meal 15 mL 11  . lisinopril (PRINIVIL,ZESTRIL) 10 MG tablet Take 1 tablet (10 mg total) by mouth daily. 90 tablet 3  . LORazepam (ATIVAN) 0.5 MG tablet Take 1 tablet (0.5 mg total) by mouth every 6 (six) hours as needed for anxiety. 90 tablet 5  . magnesium oxide (MAG-OX) 400 (241.3 Mg) MG tablet Take 2 tablets (800 mg total) by mouth 2 (two) times daily. 360 tablet 3  . metFORMIN (GLUCOPHAGE) 500 MG tablet Take 1 tablet (500 mg total) by mouth 2 (two) times daily with a meal. 180 tablet 3  . metoprolol (LOPRESSOR) 50 MG tablet TAKE 1 TABLET TWICE DAILY 180 tablet 1  . nitroGLYCERIN (NITROSTAT) 0.4 MG SL tablet Place 1 tablet (0.4 mg total) under the tongue every 5 (five) minutes as needed for chest pain. 90 tablet 1  .  promethazine (PHENERGAN) 12.5 MG tablet Take 2 tablets (25 mg total) by mouth every 6 (six) hours as needed for nausea. 12 tablet 0  . simvastatin (ZOCOR) 20 MG tablet TAKE 1 TABLET EVERY EVENING 90 tablet 1  . TRUE METRIX BLOOD GLUCOSE TEST test strip      Current Facility-Administered Medications on File Prior to Visit  Medication Dose Route Frequency Provider Last Rate Last Dose  . sodium chloride 0.9 % 1,000 mL with magnesium sulfate 2 g infusion   Intravenous Continuous Ladell Pier, MD   Stopped at 08/04/14 1441    BP 136/60 mmHg  Pulse 81  Temp(Src) 98.5 F (36.9 C) (Oral)  Resp 20  Ht _0  (1.575 m)  Wt 202 lb (91.627 kg)  BMI 36.94 kg/m2  SpO2 98%     Review of Systems  Constitutional: Negative.   HENT: Negative for congestion, dental problem, hearing loss, rhinorrhea, sinus pressure, sore throat and tinnitus.   Eyes: Negative for pain, discharge and visual disturbance.  Respiratory: Negative for cough and shortness of breath.   Cardiovascular: Negative for chest pain, palpitations and leg swelling.  Gastrointestinal: Negative for nausea, vomiting, abdominal pain, diarrhea, constipation, blood in stool and abdominal distention.  Genitourinary: Negative for dysuria, urgency, frequency, hematuria, flank pain, vaginal bleeding, vaginal discharge, difficulty urinating, vaginal pain and pelvic pain.  Musculoskeletal: Negative for joint swelling, arthralgias and gait problem.  Skin: Negative for rash.  Neurological: Negative for dizziness, syncope, speech difficulty, weakness, numbness and headaches.  Hematological: Negative for adenopathy.  Psychiatric/Behavioral: Negative for behavioral problems, dysphoric mood and agitation. The patient is not nervous/anxious.        Objective:   Physical Exam  Constitutional: She is oriented to person, place, and time. She appears well-developed and well-nourished.  Weight 202 pounds Blood pressure 130/70  HENT:  Head:  Normocephalic.  Right Ear: External ear normal.  Left Ear: External ear normal.  Mouth/Throat: Oropharynx is clear and moist.  Eyes: Conjunctivae and EOM are normal. Pupils are equal, round, and reactive to light.  Neck: Normal range of motion. Neck supple. No thyromegaly present.  Cardiovascular: Normal rate, regular rhythm, normal heart sounds and intact distal pulses.   Pulmonary/Chest: Effort normal and breath sounds normal.  Abdominal: Soft. Bowel sounds are normal. She exhibits no mass. There is no tenderness.  Musculoskeletal: Normal range of motion.  Lymphadenopathy:    She has no cervical adenopathy.  Neurological: She is alert and oriented to person, place, and time.  Skin: Skin is warm and dry. No rash noted.  Psychiatric: She  has a normal mood and affect. Her behavior is normal.          Assessment & Plan:   Diabetes mellitus.  Poor control.  Will check hemoglobin A1c.  Samples of Toujeo, dispensed.  Will start glipizide and pioglitazone per patient request.  Patient aware that unlikely we'll be able to control diabetes adequately without a more aggressive basal bolus insulin regimen Low back pain.  Analgesics refilled Essential hypertension, stable Chronic kidney disease, stable  Recheck 6 weeks  [name], MD

## 2016-06-16 NOTE — Patient Instructions (Signed)
Return in 6 weeks for follow-up  Limit your sodium (Salt) intake   Please check your hemoglobin A1c every 3 months

## 2016-06-16 NOTE — Progress Notes (Signed)
Pre visit review using our clinic review tool, if applicable. No additional management support is needed unless otherwise documented below in the visit note. 

## 2016-06-20 ENCOUNTER — Encounter: Payer: Self-pay | Admitting: *Deleted

## 2016-07-01 ENCOUNTER — Encounter: Payer: Self-pay | Admitting: *Deleted

## 2016-07-25 DIAGNOSIS — I10 Essential (primary) hypertension: Secondary | ICD-10-CM | POA: Diagnosis not present

## 2016-07-25 DIAGNOSIS — E119 Type 2 diabetes mellitus without complications: Secondary | ICD-10-CM | POA: Diagnosis not present

## 2016-07-25 DIAGNOSIS — Z794 Long term (current) use of insulin: Secondary | ICD-10-CM | POA: Diagnosis not present

## 2016-07-25 DIAGNOSIS — Z9221 Personal history of antineoplastic chemotherapy: Secondary | ICD-10-CM | POA: Diagnosis not present

## 2016-07-25 DIAGNOSIS — C92Z1 Other myeloid leukemia, in remission: Secondary | ICD-10-CM | POA: Diagnosis not present

## 2016-07-25 DIAGNOSIS — Z7982 Long term (current) use of aspirin: Secondary | ICD-10-CM | POA: Diagnosis not present

## 2016-08-04 ENCOUNTER — Encounter: Payer: Self-pay | Admitting: Internal Medicine

## 2016-08-04 ENCOUNTER — Ambulatory Visit (INDEPENDENT_AMBULATORY_CARE_PROVIDER_SITE_OTHER): Payer: Commercial Managed Care - HMO | Admitting: Internal Medicine

## 2016-08-04 VITALS — BP 130/90 | HR 71 | Temp 98.3°F | Resp 24 | Ht 62.0 in | Wt 206.5 lb

## 2016-08-04 DIAGNOSIS — N183 Chronic kidney disease, stage 3 unspecified: Secondary | ICD-10-CM

## 2016-08-04 DIAGNOSIS — R0609 Other forms of dyspnea: Secondary | ICD-10-CM | POA: Diagnosis not present

## 2016-08-04 DIAGNOSIS — I10 Essential (primary) hypertension: Secondary | ICD-10-CM | POA: Diagnosis not present

## 2016-08-04 DIAGNOSIS — E0821 Diabetes mellitus due to underlying condition with diabetic nephropathy: Secondary | ICD-10-CM

## 2016-08-04 DIAGNOSIS — Z794 Long term (current) use of insulin: Secondary | ICD-10-CM

## 2016-08-04 MED ORDER — HYDROCODONE-ACETAMINOPHEN 5-325 MG PO TABS: 1.0000 | ORAL_TABLET | Freq: Four times a day (QID) | ORAL | 0 refills | Status: DC | PRN

## 2016-08-04 NOTE — Patient Instructions (Signed)
Limit your sodium (Salt) intake  You need to lose weight.  Consider a lower calorie diet and regular exercise.    It is important that you exercise regularly, at least 20 minutes 3 to 4 times per week.  If you develop chest pain or shortness of breath seek  medical attention.   Please check your hemoglobin A1c every 3 months  Oncology follow-up as scheduled

## 2016-08-04 NOTE — Progress Notes (Signed)
Subjective:    Patient ID: [name], female    DOB: 02-May-1941, 75 y.o.   MRN: [id number]  HPI  75 year old patient who is seen today for follow-up of diabetes.  Due to cost consideration mealtime insulin was discontinued and she now is on basal insulin only, along with oral medications.  She has maintained much better glycemic control.  [facility] revealed a random sugar of 111.  Patient states fasting blood sugar today 79.  This has improved in spite of some modest weight gain  She has a 4-5 month history of dyspnea on exertion.  Due to a history of anthracycline exposure, oncology is recommended.  Echocardiogram to assess ejection fraction and also PFTs.  The patient is a former smoker starting at age 40 and discontinuation after approximately 40 years of tobacco use.  Maximum daily.  Use one pack per day  Blood pressure elevated at recent oncology evaluation.  A chest x-ray in 2015 did reveal mild cardiomegaly and prominent vascularity and small effusions  Patient is requesting hemoglobin A1c to be performed next month.  At the time of her oncology blood draw through her Port-A-Cath  Past Medical History:  Diagnosis Date  . acute myeloblastic leukemia (AML) dx'd 06/2014  . Acute pancreatitis   . Blood transfusion without reported diagnosis 2015   Leukemia  . Chronic kidney disease, stage II (mild)   . Colon polyps 04/29/2010   TUBULAR ADENOMA AND A SERRATED ADENOMA  . CORONARY ARTERY DISEASE 12/24/2007  . DIABETES MELLITUS, TYPE II 07/13/2007  . HYPERLIPIDEMIA 12/24/2007  . HYPERTENSION 07/13/2007  . NEPHROLITHIASIS, HX OF 12/24/2007  . Unspecified disease of pancreas      Social History   Social History  . Marital status: Married    Spouse name: N/A  . Number of children: N/A  . Years of education: N/A   Occupational History  . Not on file.   Social History Main Topics  . Smoking status: Former Smoker    Types: Cigarettes    Quit  date: 12/08/2000  . Smokeless tobacco: Never Used  . Alcohol use No  . Drug use: No  . Sexual activity: Not on file   Other Topics Concern  . Not on file   Social History Narrative  . No narrative on file    Past Surgical History:  Procedure Laterality Date  . ABDOMINAL HYSTERECTOMY  unknown  . CARDIAC CATHETERIZATION  2002  . CATARACT EXTRACTION Bilateral   . COLONOSCOPY  2011  . EXCISIONAL HEMORRHOIDECTOMY  unknown  . NASAL SINUS SURGERY  unknown  . POLYPECTOMY  2011    Family History  Problem Relation Age of Onset  . Cancer Father     throat ca  . COPD Father   . Cancer Sister     uterine ca and breast ca  . Cancer Brother     throat  . Colon cancer Neg Hx     No Known Allergies  Current Outpatient Prescriptions on File Prior to Visit  Medication Sig Dispense Refill  . amLODipine (NORVASC) 5 MG tablet TAKE 1 TABLET EVERY DAY 90 tablet 1  . aspirin EC 325 MG tablet Take 325 mg by mouth daily.    . Blood Glucose Monitoring Suppl (TRUE METRIX AIR GLUCOSE METER) w/Device KIT     . furosemide (LASIX) 40 MG tablet TAKE 1 TABLET EVERY DAY 90 tablet 1  . glipiZIDE (GLUCOTROL XL) 5 MG 24 hr tablet Take 1  tablet (5 mg total) by mouth daily with breakfast. 90 tablet 2  . HYDROcodone-acetaminophen (NORCO/VICODIN) 5-325 MG tablet Take 1 tablet by mouth every 6 (six) hours as needed for moderate pain. 90 tablet 0  . Insulin Glargine (TOUJEO SOLOSTAR) 300 UNIT/ML SOPN Inject 40 Units into the skin at bedtime. 1 pen 6  . lisinopril (PRINIVIL,ZESTRIL) 10 MG tablet Take 1 tablet (10 mg total) by mouth daily. 90 tablet 3  . LORazepam (ATIVAN) 0.5 MG tablet Take 1 tablet (0.5 mg total) by mouth every 6 (six) hours as needed for anxiety. 90 tablet 5  . magnesium oxide (MAG-OX) 400 (241.3 Mg) MG tablet Take 2 tablets (800 mg total) by mouth 2 (two) times daily. 360 tablet 3  . metFORMIN (GLUCOPHAGE) 500 MG tablet Take 1 tablet (500 mg total) by mouth 2 (two) times daily with a meal. 180  tablet 3  . metoprolol (LOPRESSOR) 50 MG tablet TAKE 1 TABLET TWICE DAILY 180 tablet 1  . nitroGLYCERIN (NITROSTAT) 0.4 MG SL tablet Place 1 tablet (0.4 mg total) under the tongue every 5 (five) minutes as needed for chest pain. 90 tablet 1  . pioglitazone (ACTOS) 15 MG tablet Take 1 tablet (15 mg total) by mouth daily. 90 tablet 2  . promethazine (PHENERGAN) 12.5 MG tablet Take 2 tablets (25 mg total) by mouth every 6 (six) hours as needed for nausea. 12 tablet 0  . simvastatin (ZOCOR) 20 MG tablet TAKE 1 TABLET EVERY EVENING 90 tablet 1  . TRUE METRIX BLOOD GLUCOSE TEST test strip      Current Facility-Administered Medications on File Prior to Visit  Medication Dose Route Frequency Provider Last Rate Last Dose  . sodium chloride 0.9 % 1,000 mL with magnesium sulfate 2 g infusion   Intravenous Continuous Ladell Pier, MD   Stopped at 08/04/14 1441    BP 130/90 (BP Location: Left Arm, Patient Position: Sitting, Cuff Size: Large)   Pulse 71   Temp 98.3 F (36.8 C) (Oral)   Resp (!) 24   Ht '5\' 2"'$  (1.575 m)   Wt 206 lb 8 oz (93.7 kg)   SpO2 98%   BMI 37.77 kg/m     Review of Systems  Constitutional: Negative.   HENT: Negative for congestion, dental problem, hearing loss, rhinorrhea, sinus pressure, sore throat and tinnitus.   Eyes: Negative for pain, discharge and visual disturbance.  Respiratory: Positive for shortness of breath. Negative for cough.   Cardiovascular: Negative for chest pain, palpitations and leg swelling.  Gastrointestinal: Negative for abdominal distention, abdominal pain, blood in stool, constipation, diarrhea, nausea and vomiting.  Genitourinary: Negative for difficulty urinating, dysuria, flank pain, frequency, hematuria, pelvic pain, urgency, vaginal bleeding, vaginal discharge and vaginal pain.  Musculoskeletal: Negative for arthralgias, gait problem and joint swelling.  Skin: Negative for rash.  Neurological: Negative for dizziness, syncope, speech  difficulty, weakness, numbness and headaches.  Hematological: Negative for adenopathy.  Psychiatric/Behavioral: Negative for agitation, behavioral problems and dysphoric mood. The patient is not nervous/anxious.        Objective:   Physical Exam  Constitutional: She is oriented to person, place, and time. She appears well-developed and well-nourished.  HENT:  Head: Normocephalic.  Right Ear: External ear normal.  Left Ear: External ear normal.  Mouth/Throat: Oropharynx is clear and moist.  Eyes: Conjunctivae and EOM are normal. Pupils are equal, round, and reactive to light.  Neck: Normal range of motion. Neck supple. No thyromegaly present.  Cardiovascular: Normal rate, regular rhythm, normal  heart sounds and intact distal pulses.   Pulmonary/Chest: Effort normal and breath sounds normal. No respiratory distress. She has no wheezes. She has no rales.  Abdominal: Soft. Bowel sounds are normal. She exhibits no mass. There is no tenderness.  Musculoskeletal: Normal range of motion.  Lymphadenopathy:    She has no cervical adenopathy.  Neurological: She is alert and oriented to person, place, and time.  Skin: Skin is warm and dry. No rash noted.  Psychiatric: She has a normal mood and affect. Her behavior is normal.          Assessment & Plan:   Diabetes mellitus.  Appears to be much better controlled on present regimen of basal insulin and oral therapy.  Check hemoglobin A1c with lab draw next month with oncology Hypertension.  Well-controlled today.  No change in medical regimen Dyspnea on exertion.  This has been present for 5 months.  Will check PFTs and 2-D echo Obesity.  Weight loss encouraged  Follow-up 3 months Oncology follow-up as scheduled  [name], MD

## 2016-08-25 ENCOUNTER — Telehealth: Payer: Self-pay | Admitting: Internal Medicine

## 2016-08-25 ENCOUNTER — Other Ambulatory Visit (HOSPITAL_BASED_OUTPATIENT_CLINIC_OR_DEPARTMENT_OTHER): Payer: Commercial Managed Care - HMO

## 2016-08-25 ENCOUNTER — Telehealth: Payer: Self-pay | Admitting: Oncology

## 2016-08-25 ENCOUNTER — Ambulatory Visit (HOSPITAL_BASED_OUTPATIENT_CLINIC_OR_DEPARTMENT_OTHER): Payer: Commercial Managed Care - HMO

## 2016-08-25 ENCOUNTER — Ambulatory Visit (HOSPITAL_BASED_OUTPATIENT_CLINIC_OR_DEPARTMENT_OTHER): Payer: Commercial Managed Care - HMO | Admitting: Nurse Practitioner

## 2016-08-25 DIAGNOSIS — N289 Disorder of kidney and ureter, unspecified: Secondary | ICD-10-CM | POA: Diagnosis not present

## 2016-08-25 DIAGNOSIS — Z23 Encounter for immunization: Secondary | ICD-10-CM

## 2016-08-25 DIAGNOSIS — Z452 Encounter for adjustment and management of vascular access device: Secondary | ICD-10-CM | POA: Diagnosis not present

## 2016-08-25 DIAGNOSIS — E876 Hypokalemia: Secondary | ICD-10-CM

## 2016-08-25 DIAGNOSIS — D649 Anemia, unspecified: Secondary | ICD-10-CM

## 2016-08-25 DIAGNOSIS — E119 Type 2 diabetes mellitus without complications: Secondary | ICD-10-CM | POA: Diagnosis not present

## 2016-08-25 DIAGNOSIS — C9201 Acute myeloblastic leukemia, in remission: Secondary | ICD-10-CM

## 2016-08-25 DIAGNOSIS — C9501 Acute leukemia of unspecified cell type, in remission: Secondary | ICD-10-CM

## 2016-08-25 DIAGNOSIS — Z95828 Presence of other vascular implants and grafts: Secondary | ICD-10-CM

## 2016-08-25 DIAGNOSIS — D61810 Antineoplastic chemotherapy induced pancytopenia: Secondary | ICD-10-CM

## 2016-08-25 DIAGNOSIS — D696 Thrombocytopenia, unspecified: Secondary | ICD-10-CM

## 2016-08-25 DIAGNOSIS — E1129 Type 2 diabetes mellitus with other diabetic kidney complication: Secondary | ICD-10-CM | POA: Diagnosis not present

## 2016-08-25 LAB — COMPREHENSIVE METABOLIC PANEL
ALBUMIN: 3.1 g/dL — AB (ref 3.5–5.0)
ALK PHOS: 87 U/L (ref 40–150)
ALT: 14 U/L (ref 0–55)
AST: 15 U/L (ref 5–34)
Anion Gap: 7 mEq/L (ref 3–11)
BUN: 21.1 mg/dL (ref 7.0–26.0)
CALCIUM: 9.2 mg/dL (ref 8.4–10.4)
CHLORIDE: 108 meq/L (ref 98–109)
CO2: 28 mEq/L (ref 22–29)
Creatinine: 1.4 mg/dL — ABNORMAL HIGH (ref 0.6–1.1)
EGFR: 37 mL/min/{1.73_m2} — AB (ref >90)
Glucose: 169 mg/dl — ABNORMAL HIGH (ref 70–140)
POTASSIUM: 4.4 meq/L (ref 3.5–5.1)
Sodium: 143 mEq/L (ref 136–145)
Total Bilirubin: 0.45 mg/dL (ref 0.20–1.20)
Total Protein: 6.3 g/dL — ABNORMAL LOW (ref 6.4–8.3)

## 2016-08-25 LAB — CBC WITH DIFFERENTIAL/PLATELET
BASO%: 0.1 % (ref 0.0–2.0)
BASOS ABS: 0 10*3/uL (ref 0.0–0.1)
EOS ABS: 0.2 10*3/uL (ref 0.0–0.5)
EOS%: 2.1 % (ref 0.0–7.0)
HEMATOCRIT: 36.5 % (ref 34.8–46.6)
HEMOGLOBIN: 12 g/dL (ref 11.6–15.9)
LYMPH#: 1.2 10*3/uL (ref 0.9–3.3)
LYMPH%: 16.4 % (ref 14.0–49.7)
MCH: 31.2 pg (ref 25.1–34.0)
MCHC: 32.7 g/dL (ref 31.5–36.0)
MCV: 95.4 fL (ref 79.5–101.0)
MONO#: 0.6 10*3/uL (ref 0.1–0.9)
MONO%: 9 % (ref 0.0–14.0)
NEUT#: 5.1 10*3/uL (ref 1.5–6.5)
NEUT%: 72.4 % (ref 38.4–76.8)
Platelets: 130 10*3/uL — ABNORMAL LOW (ref 145–400)
RBC: 3.83 10*6/uL (ref 3.70–5.45)
RDW: 13.2 % (ref 11.2–14.5)
WBC: 7.1 10*3/uL (ref 3.9–10.3)

## 2016-08-25 LAB — HEMOGLOBIN A1C: Hemoglobin A1C: 6.6

## 2016-08-25 MED ORDER — SODIUM CHLORIDE 0.9 % IJ SOLN
10.0000 mL | INTRAMUSCULAR | Status: DC | PRN
  Administered 2016-08-25: 10 mL via INTRAVENOUS
  Filled 2016-08-25: qty 10

## 2016-08-25 MED ORDER — HEPARIN SOD (PORK) LOCK FLUSH 100 UNIT/ML IV SOLN
500.0000 [IU] | Freq: Once | INTRAVENOUS | Status: AC | PRN
  Administered 2016-08-25: 500 [IU] via INTRAVENOUS
  Filled 2016-08-25: qty 5

## 2016-08-25 MED ORDER — INFLUENZA VAC SPLIT QUAD 0.5 ML IM SUSY
0.5000 mL | PREFILLED_SYRINGE | Freq: Once | INTRAMUSCULAR | Status: AC
  Administered 2016-08-25: 0.5 mL via INTRAMUSCULAR
  Filled 2016-08-25: qty 0.5

## 2016-08-25 MED ORDER — HEPARIN SOD (PORK) LOCK FLUSH 100 UNIT/ML IV SOLN
500.0000 [IU] | Freq: Once | INTRAVENOUS | Status: DC
  Filled 2016-08-25: qty 5

## 2016-08-25 NOTE — Telephone Encounter (Signed)
[facility] needs order a1c fax to [phone]. Please put dx code

## 2016-08-25 NOTE — Patient Instructions (Signed)
Influenza Virus Vaccine (Flucelvax) What is this medicine? INFLUENZA VIRUS VACCINE (in floo EN zuh VAHY ruhs vak SEEN) helps to reduce the risk of getting influenza also known as the flu. The vaccine only helps protect you against some strains of the flu. This medicine may be used for other purposes; ask your health care provider or pharmacist if you have questions. What should I tell my health care provider before I take this medicine? They need to know if you have any of these conditions: -bleeding disorder like hemophilia -fever or infection -Guillain-Barre syndrome or other neurological problems -immune system problems -infection with the human immunodeficiency virus (HIV) or AIDS -low blood platelet counts -multiple sclerosis -an unusual or allergic reaction to influenza virus vaccine, other medicines, foods, dyes or preservatives -pregnant or trying to get pregnant -breast-feeding How should I use this medicine? This vaccine is for injection into a muscle. It is given by a health care professional. A copy of Vaccine Information Statements will be given before each vaccination. Read this sheet carefully each time. The sheet may change frequently. Talk to your pediatrician regarding the use of this medicine in children. Special care may be needed. Overdosage: If you think you've taken too much of this medicine contact a poison control center or emergency room at once. Overdosage: If you think you have taken too much of this medicine contact a poison control center or emergency room at once. NOTE: This medicine is only for you. Do not share this medicine with others. What if I miss a dose? This does not apply. What may interact with this medicine? -chemotherapy or radiation therapy -medicines that lower your immune system like etanercept, anakinra, infliximab, and adalimumab -medicines that treat or prevent blood clots like warfarin -phenytoin -steroid medicines like prednisone or  cortisone -theophylline -vaccines This list may not describe all possible interactions. Give your health care provider a list of all the medicines, herbs, non-prescription drugs, or dietary supplements you use. Also tell them if you smoke, drink alcohol, or use illegal drugs. Some items may interact with your medicine. What should I watch for while using this medicine? Report any side effects that do not go away within 3 days to your doctor or health care professional. Call your health care provider if any unusual symptoms occur within 6 weeks of receiving this vaccine. You may still catch the flu, but the illness is not usually as bad. You cannot get the flu from the vaccine. The vaccine will not protect against colds or other illnesses that may cause fever. The vaccine is needed every year. What side effects may I notice from receiving this medicine? Side effects that you should report to your doctor or health care professional as soon as possible: -allergic reactions like skin rash, itching or hives, swelling of the face, lips, or tongue Side effects that usually do not require medical attention (Report these to your doctor or health care professional if they continue or are bothersome.): -fever -headache -muscle aches and pains -pain, tenderness, redness, or swelling at the injection site -tiredness This list may not describe all possible side effects. Call your doctor for medical advice about side effects. You may report side effects to FDA at 1-800-FDA-1088. Where should I keep my medicine? The vaccine will be given by a health care professional in a clinic, pharmacy, doctor's office, or other health care setting. You will not be given vaccine doses to store at home. NOTE: This sheet is a summary. It may not cover   all possible information. If you have questions about this medicine, talk to your doctor, pharmacist, or health care provider.    2016, Elsevier/Gold Standard. (2011-11-05  14:06:47)  

## 2016-08-25 NOTE — Telephone Encounter (Signed)
Pt request refill  °HYDROcodone-acetaminophen (NORCO/VICODIN) 5-325 MG tablet °

## 2016-08-25 NOTE — Telephone Encounter (Signed)
Okay to refill? 

## 2016-08-25 NOTE — Progress Notes (Signed)
Pt requesting HGB A1C drawn while in office today, states Dr. Nonie Hoyer was requesting it and she wanted it drawn from her port. Noted PCP office note from 08/04/16 states pt to have it drawn during oncology visit, no orders noted in system. Called Dr. Lolita Rieger office and spoke with Constance Holster and requested an order with diagnosis code be faxed to this office.

## 2016-08-25 NOTE — Telephone Encounter (Signed)
Gave patient avs report and appointments for December  °

## 2016-08-25 NOTE — Progress Notes (Signed)
  [name] OFFICE PROGRESS NOTE   Diagnosis:  AML  INTERVAL HISTORY:   [name] returns as scheduled. She saw Dr. Florene Glen on 07/25/2016. Next visit in 3 months. No interim illnesses or infections. No fever. She has persistent dyspnea on exertion. No chest pain. Stable chronic back pain.  Objective:  Vital signs in last 24 hours:  Blood pressure (!) 165/62, pulse 62, temperature 98.4 F (36.9 C), temperature source Oral, resp. rate 18, height 5\' 2"  (1.575 m), weight 209 lb 1.6 oz (94.8 kg), SpO2 100 %.    HEENT: No thrush or ulcers. Resp: Lungs clear bilaterally. Cardio: Regular rate and rhythm. GI: Abdomen soft and nontender. No organomegaly. Vascular: No leg edema. Port-A-Cath without erythema.  Lab Results:  Lab Results  Component Value Date   WBC 7.1 08/25/2016   HGB 12.0 08/25/2016   HCT 36.5 08/25/2016   MCV 95.4 08/25/2016   PLT 130 (L) 08/25/2016   NEUTROABS 5.1 08/25/2016    Imaging:  No results found.  Medications: I have reviewed the patient's current medications.  Assessment/Plan: 1. 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  Status post cycle 3 high-dose araC consolidation 08/22/2014.  Bone marrow 09/15/2014 showed necrotic marrow with minute focus of myeloid precursors. No evidence of viable acute leukemia. 2. Pancytopenia secondary to chemotherapy. Improved.  3. C. difficile colitis-06/09/2014 treated with Flagyl  4. Diabetes  5. renal insufficiency  6. history of hypokalemia and hypomagnesemia. 7. history of atrial fibrillation  9. history of coronary artery disease  10.  Hospitalization with febrile neutropenia/sepsis secondary to Escherichia coli bacteremia 09/02/2014. 11. Shingles involving the right back October 2015. 12. Meningioma on brain CT 05/24/2014 and brain MRI 05/24/2014   Disposition: [name] remains in clinical remission from AML. She has mild persistent anemia and thrombocytopenia. She will see Dr. Florene Glen in approximately 6 weeks and return for a follow-up visit here in 12 weeks. We administered the influenza vaccine at today's visit.    Ned Card ANP/GNP-BC   08/25/2016  11:37 AM

## 2016-08-25 NOTE — Telephone Encounter (Signed)
Order faxed to [facility] for Hemoglobin A1c.

## 2016-08-25 NOTE — Telephone Encounter (Signed)
Pt requesting refill Hydrocodone, last fill 8/28, # [phone] please advise if refill okay?

## 2016-08-25 NOTE — Patient Instructions (Signed)

## 2016-08-26 MED ORDER — HYDROCODONE-ACETAMINOPHEN 5-325 MG PO TABS: 1.0000 | ORAL_TABLET | Freq: Four times a day (QID) | ORAL | 0 refills | Status: DC | PRN

## 2016-08-26 NOTE — Telephone Encounter (Signed)
Left message on voicemail Rx ready for pickup will be at the front desk. Rx printed and signed.

## 2016-08-27 ENCOUNTER — Ambulatory Visit (HOSPITAL_COMMUNITY): Payer: Commercial Managed Care - HMO | Attending: Cardiology

## 2016-08-27 DIAGNOSIS — I350 Nonrheumatic aortic (valve) stenosis: Secondary | ICD-10-CM | POA: Diagnosis not present

## 2016-08-27 DIAGNOSIS — R0609 Other forms of dyspnea: Secondary | ICD-10-CM | POA: Diagnosis not present

## 2016-09-01 ENCOUNTER — Ambulatory Visit: Payer: Commercial Managed Care - HMO | Admitting: Internal Medicine

## 2016-09-04 ENCOUNTER — Encounter: Payer: Self-pay | Admitting: Internal Medicine

## 2016-09-23 ENCOUNTER — Other Ambulatory Visit: Payer: Self-pay | Admitting: Internal Medicine

## 2016-09-26 ENCOUNTER — Other Ambulatory Visit: Payer: Self-pay | Admitting: Internal Medicine

## 2016-09-26 DIAGNOSIS — Z1231 Encounter for screening mammogram for malignant neoplasm of breast: Secondary | ICD-10-CM

## 2016-10-03 ENCOUNTER — Telehealth: Payer: Self-pay | Admitting: Internal Medicine

## 2016-10-03 NOTE — Telephone Encounter (Signed)
Pt need new Rx for Hydrocodone  Pt is aware of the 3 business day refill and that Dr. Raliegh Ip is not in the office today 10/27

## 2016-10-06 MED ORDER — HYDROCODONE-ACETAMINOPHEN 5-325 MG PO TABS: 1.0000 | ORAL_TABLET | Freq: Four times a day (QID) | ORAL | 0 refills | Status: DC | PRN

## 2016-10-06 NOTE — Telephone Encounter (Signed)
Pt notified Rx ready for pickup. Rx printed and signed.  

## 2016-10-24 DIAGNOSIS — C9201 Acute myeloblastic leukemia, in remission: Secondary | ICD-10-CM | POA: Diagnosis not present

## 2016-10-24 DIAGNOSIS — R0600 Dyspnea, unspecified: Secondary | ICD-10-CM | POA: Diagnosis not present

## 2016-10-24 DIAGNOSIS — J9601 Acute respiratory failure with hypoxia: Secondary | ICD-10-CM | POA: Diagnosis not present

## 2016-10-24 DIAGNOSIS — I129 Hypertensive chronic kidney disease with stage 1 through stage 4 chronic kidney disease, or unspecified chronic kidney disease: Secondary | ICD-10-CM | POA: Diagnosis not present

## 2016-10-24 DIAGNOSIS — K5900 Constipation, unspecified: Secondary | ICD-10-CM | POA: Diagnosis not present

## 2016-10-24 DIAGNOSIS — J90 Pleural effusion, not elsewhere classified: Secondary | ICD-10-CM | POA: Diagnosis not present

## 2016-10-24 DIAGNOSIS — E1122 Type 2 diabetes mellitus with diabetic chronic kidney disease: Secondary | ICD-10-CM | POA: Diagnosis not present

## 2016-10-24 DIAGNOSIS — I480 Paroxysmal atrial fibrillation: Secondary | ICD-10-CM | POA: Diagnosis not present

## 2016-10-24 DIAGNOSIS — E119 Type 2 diabetes mellitus without complications: Secondary | ICD-10-CM | POA: Diagnosis not present

## 2016-10-24 DIAGNOSIS — N189 Chronic kidney disease, unspecified: Secondary | ICD-10-CM | POA: Diagnosis not present

## 2016-10-24 DIAGNOSIS — N183 Chronic kidney disease, stage 3 (moderate): Secondary | ICD-10-CM | POA: Diagnosis not present

## 2016-10-24 DIAGNOSIS — E785 Hyperlipidemia, unspecified: Secondary | ICD-10-CM | POA: Diagnosis not present

## 2016-10-24 DIAGNOSIS — J189 Pneumonia, unspecified organism: Secondary | ICD-10-CM | POA: Diagnosis not present

## 2016-10-24 DIAGNOSIS — I2510 Atherosclerotic heart disease of native coronary artery without angina pectoris: Secondary | ICD-10-CM | POA: Diagnosis not present

## 2016-10-29 ENCOUNTER — Telehealth: Payer: Self-pay | Admitting: *Deleted

## 2016-10-29 DIAGNOSIS — E538 Deficiency of other specified B group vitamins: Secondary | ICD-10-CM | POA: Insufficient documentation

## 2016-10-29 NOTE — Telephone Encounter (Signed)
Spoke to pt, told her Dr.K received lab result for Vit B12 from [place] her need to start Vit B12 injections once a week x 4 weeks and then monthly. Pt verbalized understanding. Asked pt if she can come in next week to start injections? Pt said yes. Told pt I will have schedulers call her to schedule. Pt verbalized understanding.

## 2016-11-04 ENCOUNTER — Ambulatory Visit: Payer: Commercial Managed Care - HMO | Admitting: Internal Medicine

## 2016-11-04 ENCOUNTER — Telehealth: Payer: Self-pay | Admitting: Internal Medicine

## 2016-11-04 ENCOUNTER — Ambulatory Visit (INDEPENDENT_AMBULATORY_CARE_PROVIDER_SITE_OTHER): Payer: Commercial Managed Care - HMO | Admitting: *Deleted

## 2016-11-04 DIAGNOSIS — E538 Deficiency of other specified B group vitamins: Secondary | ICD-10-CM | POA: Diagnosis not present

## 2016-11-04 MED ORDER — HYDROCODONE-ACETAMINOPHEN 5-325 MG PO TABS: 1.0000 | ORAL_TABLET | Freq: Four times a day (QID) | ORAL | 0 refills | Status: DC | PRN

## 2016-11-04 MED ORDER — CYANOCOBALAMIN 1000 MCG/ML IJ SOLN
1000.0000 ug | Freq: Once | INTRAMUSCULAR | Status: AC
  Administered 2016-11-04: 1000 ug via INTRAMUSCULAR

## 2016-11-04 NOTE — Telephone Encounter (Signed)
Pt notified Rx ready for pickup. Rx printed and signed.  

## 2016-11-04 NOTE — Telephone Encounter (Signed)
Pt request refill  HYDROcodone-acetaminophen (NORCO/VICODIN) 5-325 MG   Pt has a B12 at noon and wants to know if she can pick up when she comes in for that today?

## 2016-11-07 ENCOUNTER — Ambulatory Visit
Admission: RE | Admit: 2016-11-07 | Discharge: 2016-11-07 | Disposition: A | Discharge status: Home / Self Care | Payer: Commercial Managed Care - HMO | Source: Ambulatory Visit | Attending: Internal Medicine | Admitting: Internal Medicine

## 2016-11-07 DIAGNOSIS — Z1231 Encounter for screening mammogram for malignant neoplasm of breast: Secondary | ICD-10-CM

## 2016-11-11 ENCOUNTER — Ambulatory Visit: Payer: Commercial Managed Care - HMO | Admitting: Internal Medicine

## 2016-11-11 ENCOUNTER — Ambulatory Visit: Payer: Commercial Managed Care - HMO

## 2016-11-11 ENCOUNTER — Ambulatory Visit (INDEPENDENT_AMBULATORY_CARE_PROVIDER_SITE_OTHER): Payer: Commercial Managed Care - HMO

## 2016-11-11 DIAGNOSIS — E538 Deficiency of other specified B group vitamins: Secondary | ICD-10-CM

## 2016-11-11 MED ORDER — CYANOCOBALAMIN 1000 MCG/ML IJ SOLN
1000.0000 ug | Freq: Once | INTRAMUSCULAR | Status: AC
  Administered 2016-11-11: 1000 ug via INTRAMUSCULAR

## 2016-11-17 ENCOUNTER — Ambulatory Visit: Payer: Commercial Managed Care - HMO | Admitting: Internal Medicine

## 2016-11-18 ENCOUNTER — Other Ambulatory Visit: Payer: Self-pay | Admitting: Internal Medicine

## 2016-11-18 ENCOUNTER — Encounter: Payer: Self-pay | Admitting: *Deleted

## 2016-11-18 ENCOUNTER — Ambulatory Visit (INDEPENDENT_AMBULATORY_CARE_PROVIDER_SITE_OTHER): Payer: Commercial Managed Care - HMO | Admitting: Internal Medicine

## 2016-11-18 ENCOUNTER — Encounter: Payer: Self-pay | Admitting: Internal Medicine

## 2016-11-18 VITALS — BP 140/66 | HR 83 | Temp 97.8°F | Ht 62.0 in | Wt 217.8 lb

## 2016-11-18 DIAGNOSIS — Z794 Long term (current) use of insulin: Secondary | ICD-10-CM

## 2016-11-18 DIAGNOSIS — E538 Deficiency of other specified B group vitamins: Secondary | ICD-10-CM

## 2016-11-18 DIAGNOSIS — R0609 Other forms of dyspnea: Secondary | ICD-10-CM | POA: Diagnosis not present

## 2016-11-18 DIAGNOSIS — N183 Chronic kidney disease, stage 3 unspecified: Secondary | ICD-10-CM

## 2016-11-18 DIAGNOSIS — E0821 Diabetes mellitus due to underlying condition with diabetic nephropathy: Secondary | ICD-10-CM

## 2016-11-18 DIAGNOSIS — I10 Essential (primary) hypertension: Secondary | ICD-10-CM | POA: Diagnosis not present

## 2016-11-18 DIAGNOSIS — Z23 Encounter for immunization: Secondary | ICD-10-CM

## 2016-11-18 MED ORDER — CYANOCOBALAMIN 1000 MCG/ML IJ SOLN
1000.0000 ug | Freq: Once | INTRAMUSCULAR | Status: AC
Start: 2016-11-18 — End: 2016-11-18
  Administered 2016-11-18: 1000 ug via INTRAMUSCULAR

## 2016-11-18 NOTE — Progress Notes (Signed)
Pre visit review using our clinic review tool, if applicable. No additional management support is needed unless otherwise documented below in the visit note. 

## 2016-11-18 NOTE — Progress Notes (Signed)
Subjective:    Patient ID: [name], female    DOB: [birth year], 75 y.o.   MRN: [id number]  HPI  75 year old patient who is seen today for follow-up.  She is followed closely by oncology with a history of AML in remission. She has type 2 diabetes  Lab Results  Component Value Date   HGBA1C 6.6 08/25/2016   Doing quite well.  Due to dyspnea on exertion as well as anthracycline exposure.  A 2-D echocardiogram was obtained that was unremarkable.  PFTs were also ordered but have not been performed.  Her DOE is about the same She has essential hypertension. Diabetes remains well controlled. She has chronic kidney disease and is on a submaximal dose of metformin therapy.  She remains on basal insulin and continues to do well She receives monthly B12 injections  Past Medical History:  Diagnosis Date  . acute myeloblastic leukemia (AML) dx'd 06/2014  . Acute pancreatitis   . Blood transfusion without reported diagnosis 2015   Leukemia  . Chronic kidney disease, stage II (mild)   . Colon polyps 04/29/2010   TUBULAR ADENOMA AND A SERRATED ADENOMA  . CORONARY ARTERY DISEASE 12/24/2007  . DIABETES MELLITUS, TYPE II 07/13/2007  . HYPERLIPIDEMIA 12/24/2007  . HYPERTENSION 07/13/2007  . NEPHROLITHIASIS, HX OF 12/24/2007  . Unspecified disease of pancreas      Social History   Social History  . Marital status: Married    Spouse name: N/A  . Number of children: N/A  . Years of education: N/A   Occupational History  . Not on file.   Social History Main Topics  . Smoking status: Former Smoker    Types: Cigarettes    Quit date: 12/08/2000  . Smokeless tobacco: Never Used  . Alcohol use No  . Drug use: No  . Sexual activity: Not on file   Other Topics Concern  . Not on file   Social History Narrative  . No narrative on file    Past Surgical History:  Procedure Laterality Date  . ABDOMINAL HYSTERECTOMY  unknown  . CARDIAC CATHETERIZATION  2002  . CATARACT EXTRACTION Bilateral    . COLONOSCOPY  2011  . EXCISIONAL HEMORRHOIDECTOMY  unknown  . NASAL SINUS SURGERY  unknown  . POLYPECTOMY  2011    Family History  Problem Relation Age of Onset  . Cancer Father     throat ca  . COPD Father   . Cancer Sister     uterine ca and breast ca  . Cancer Brother     throat  . Colon cancer Neg Hx     No Known Allergies  Current Outpatient Prescriptions on File Prior to Visit  Medication Sig Dispense Refill  . amLODipine (NORVASC) 5 MG tablet TAKE 1 TABLET EVERY DAY 90 tablet 1  . aspirin EC 325 MG tablet Take 325 mg by mouth daily.    . Blood Glucose Monitoring Suppl (TRUE METRIX AIR GLUCOSE METER) w/Device KIT     . furosemide (LASIX) 40 MG tablet TAKE 1 TABLET EVERY DAY 90 tablet 1  . glipiZIDE (GLUCOTROL XL) 5 MG 24 hr tablet Take 1 tablet (5 mg total) by mouth daily with breakfast. 90 tablet 2  . HYDROcodone-acetaminophen (NORCO/VICODIN) 5-325 MG tablet Take 1 tablet by mouth every 6 (six) hours as needed for moderate pain. 90 tablet 0  . Insulin Glargine (TOUJEO SOLOSTAR) 300 UNIT/ML SOPN Inject 40 Units into the skin at bedtime. 1 pen 6  . lisinopril (PRINIVIL,ZESTRIL)  10 MG tablet TAKE 1 TABLET EVERY DAY 90 tablet 3  . LORazepam (ATIVAN) 0.5 MG tablet Take 1 tablet (0.5 mg total) by mouth every 6 (six) hours as needed for anxiety. 90 tablet 5  . magnesium oxide (MAG-OX) 400 (241.3 Mg) MG tablet Take 2 tablets (800 mg total) by mouth 2 (two) times daily. 360 tablet 3  . metFORMIN (GLUCOPHAGE) 500 MG tablet Take 1 tablet (500 mg total) by mouth 2 (two) times daily with a meal. 180 tablet 3  . metoprolol (LOPRESSOR) 50 MG tablet TAKE 1 TABLET TWICE DAILY 180 tablet 1  . nitroGLYCERIN (NITROSTAT) 0.4 MG SL tablet Place 1 tablet (0.4 mg total) under the tongue every 5 (five) minutes as needed for chest pain. 90 tablet 1  . pioglitazone (ACTOS) 15 MG tablet Take 1 tablet (15 mg total) by mouth daily. 90 tablet 2  . promethazine (PHENERGAN) 12.5 MG tablet Take 2 tablets  (25 mg total) by mouth every 6 (six) hours as needed for nausea. 12 tablet 0  . simvastatin (ZOCOR) 20 MG tablet TAKE 1 TABLET EVERY EVENING 90 tablet 1  . TRUE METRIX BLOOD GLUCOSE TEST test strip      Current Facility-Administered Medications on File Prior to Visit  Medication Dose Route Frequency Provider Last Rate Last Dose  . sodium chloride 0.9 % 1,000 mL with magnesium sulfate 2 g infusion   Intravenous Continuous Ladell Pier, MD   Stopped at 08/04/14 1441    BP 140/66 (BP Location: Left Arm, Patient Position: Sitting, Cuff Size: Large)   Pulse 83   Temp 97.8 F (36.6 C) (Oral)   Ht 5' 2" (1.575 m)   Wt 217 lb 12 oz (98.8 kg)   SpO2 97%   BMI 39.83 kg/m     Review of Systems  Constitutional: Negative.   HENT: Negative for congestion, dental problem, hearing loss, rhinorrhea, sinus pressure, sore throat and tinnitus.   Eyes: Negative for pain, discharge and visual disturbance.  Respiratory: Positive for shortness of breath. Negative for cough.   Cardiovascular: Negative for chest pain, palpitations and leg swelling.  Gastrointestinal: Negative for abdominal distention, abdominal pain, blood in stool, constipation, diarrhea, nausea and vomiting.  Genitourinary: Negative for difficulty urinating, dysuria, flank pain, frequency, hematuria, pelvic pain, urgency, vaginal bleeding, vaginal discharge and vaginal pain.  Musculoskeletal: Positive for back pain. Negative for arthralgias, gait problem and joint swelling.  Skin: Negative for rash.  Neurological: Negative for dizziness, syncope, speech difficulty, weakness, numbness and headaches.  Hematological: Negative for adenopathy.  Psychiatric/Behavioral: Negative for agitation, behavioral problems and dysphoric mood. The patient is not nervous/anxious.        Objective:   Physical Exam  Constitutional: She is oriented to person, place, and time. She appears well-developed and well-nourished.  Blood pressure 140/70  HENT:   Head: Normocephalic.  Right Ear: External ear normal.  Left Ear: External ear normal.  Mouth/Throat: Oropharynx is clear and moist.  Eyes: Conjunctivae and EOM are normal. Pupils are equal, round, and reactive to light.  Neck: Normal range of motion. Neck supple. No thyromegaly present.  Cardiovascular: Normal rate, regular rhythm, normal heart sounds and intact distal pulses.   Pulmonary/Chest: Effort normal and breath sounds normal. No respiratory distress. She has no wheezes. She has no rales.  Abdominal: Soft. Bowel sounds are normal. She exhibits no mass. There is no tenderness.  Musculoskeletal: Normal range of motion. She exhibits no edema.  Lymphadenopathy:    She has no cervical adenopathy.  Neurological: She is alert and oriented to person, place, and time.  Skin: Skin is warm and dry. No rash noted.  Psychiatric: She has a normal mood and affect. Her behavior is normal.          Assessment & Plan:   Diabetes mellitus.  Well-controlled.  No change in medical therapy Essential hypertension, stable Chronic kidney disease History of AML in remission Dyspnea on exertion.  Will reschedule PFTs  Follow-up 4 months  Jeniece Hannis Pilar Plate

## 2016-11-18 NOTE — Patient Instructions (Signed)
Limit your sodium (Salt) intake  Pulmonary function studies as discussed    It is important that you exercise regularly, at least 20 minutes 3 to 4 times per week.  If you develop chest pain or shortness of breath seek  medical attention.  You need to lose weight.  Consider a lower calorie diet and regular exercise.

## 2016-11-25 ENCOUNTER — Ambulatory Visit (INDEPENDENT_AMBULATORY_CARE_PROVIDER_SITE_OTHER): Payer: Commercial Managed Care - HMO

## 2016-11-25 ENCOUNTER — Other Ambulatory Visit: Payer: Self-pay | Admitting: Internal Medicine

## 2016-11-25 DIAGNOSIS — E538 Deficiency of other specified B group vitamins: Secondary | ICD-10-CM

## 2016-11-25 MED ORDER — CYANOCOBALAMIN 1000 MCG/ML IJ SOLN
1000.0000 ug | Freq: Once | INTRAMUSCULAR | Status: AC
  Administered 2016-11-25: 1000 ug via INTRAMUSCULAR

## 2016-11-25 NOTE — Progress Notes (Signed)
Patient had a B12 injection on 11/25/16 at 12.45pm for Vit B12 Deficiency.

## 2016-12-02 ENCOUNTER — Other Ambulatory Visit: Payer: Self-pay | Admitting: Internal Medicine

## 2016-12-02 ENCOUNTER — Ambulatory Visit (INDEPENDENT_AMBULATORY_CARE_PROVIDER_SITE_OTHER): Payer: Commercial Managed Care - HMO

## 2016-12-02 DIAGNOSIS — E539 Vitamin B deficiency, unspecified: Secondary | ICD-10-CM | POA: Diagnosis not present

## 2016-12-02 MED ORDER — CYANOCOBALAMIN 1000 MCG/ML IJ SOLN
1000.0000 ug | Freq: Once | INTRAMUSCULAR | Status: AC
  Administered 2016-12-02: 1000 ug via INTRAMUSCULAR

## 2016-12-02 MED ORDER — HYDROCODONE-ACETAMINOPHEN 5-325 MG PO TABS: 1.0000 | ORAL_TABLET | Freq: Four times a day (QID) | ORAL | 0 refills | Status: DC | PRN

## 2016-12-02 NOTE — Progress Notes (Signed)
Patient got her B12 injection for her Vit B deficiency today at 12PM. Given by Izora Gala CMA

## 2016-12-02 NOTE — Telephone Encounter (Signed)
Pt need new Rx for Hydrocodone  Pt is aware of Dr. Raliegh Ip being out of the office and of the 3 business days for the refill.   Pt would like to get the refill today when she comes in for a appointment.

## 2016-12-02 NOTE — Telephone Encounter (Signed)
Pt notified Rx ready for pickup. Rx printed and signed.  

## 2016-12-04 ENCOUNTER — Telehealth: Payer: Self-pay | Admitting: *Deleted

## 2016-12-04 NOTE — Telephone Encounter (Signed)
Called pt to reschedule office visit due to change in MD schedule. MD will see her with next port flush. Pt reports she is scheduled for port flush 01/23/17. Requests visit 6 weeks from that appt. Message to schedulers. Pt will pick up appt while here for lab on 12/29.

## 2016-12-05 ENCOUNTER — Ambulatory Visit (HOSPITAL_BASED_OUTPATIENT_CLINIC_OR_DEPARTMENT_OTHER): Payer: Commercial Managed Care - HMO

## 2016-12-05 ENCOUNTER — Telehealth: Payer: Self-pay | Admitting: Oncology

## 2016-12-05 ENCOUNTER — Other Ambulatory Visit (HOSPITAL_BASED_OUTPATIENT_CLINIC_OR_DEPARTMENT_OTHER): Payer: Commercial Managed Care - HMO

## 2016-12-05 ENCOUNTER — Ambulatory Visit: Payer: Commercial Managed Care - HMO | Admitting: Oncology

## 2016-12-05 DIAGNOSIS — Z95828 Presence of other vascular implants and grafts: Secondary | ICD-10-CM

## 2016-12-05 DIAGNOSIS — C9201 Acute myeloblastic leukemia, in remission: Secondary | ICD-10-CM | POA: Diagnosis not present

## 2016-12-05 DIAGNOSIS — Z452 Encounter for adjustment and management of vascular access device: Secondary | ICD-10-CM | POA: Diagnosis not present

## 2016-12-05 LAB — CBC WITH DIFFERENTIAL/PLATELET
BASO%: 0.2 % (ref 0.0–2.0)
Basophils Absolute: 0 10*3/uL (ref 0.0–0.1)
EOS ABS: 0.1 10*3/uL (ref 0.0–0.5)
EOS%: 2.5 % (ref 0.0–7.0)
HCT: 31.8 % — ABNORMAL LOW (ref 34.8–46.6)
HGB: 10.2 g/dL — ABNORMAL LOW (ref 11.6–15.9)
LYMPH%: 21.2 % (ref 14.0–49.7)
MCH: 30.1 pg (ref 25.1–34.0)
MCHC: 32.1 g/dL (ref 31.5–36.0)
MCV: 93.8 fL (ref 79.5–101.0)
MONO#: 0.5 10*3/uL (ref 0.1–0.9)
MONO%: 9.4 % (ref 0.0–14.0)
NEUT#: 3.8 10*3/uL (ref 1.5–6.5)
NEUT%: 66.7 % (ref 38.4–76.8)
PLATELETS: 133 10*3/uL — AB (ref 145–400)
RBC: 3.39 10*6/uL — ABNORMAL LOW (ref 3.70–5.45)
RDW: 13.3 % (ref 11.2–14.5)
WBC: 5.6 10*3/uL (ref 3.9–10.3)
lymph#: 1.2 10*3/uL (ref 0.9–3.3)

## 2016-12-05 LAB — COMPREHENSIVE METABOLIC PANEL
ALT: 14 U/L (ref 0–55)
ANION GAP: 9 meq/L (ref 3–11)
AST: 15 U/L (ref 5–34)
Albumin: 2.9 g/dL — ABNORMAL LOW (ref 3.5–5.0)
Alkaline Phosphatase: 90 U/L (ref 40–150)
BILIRUBIN TOTAL: 0.46 mg/dL (ref 0.20–1.20)
BUN: 28.8 mg/dL — AB (ref 7.0–26.0)
CALCIUM: 8.6 mg/dL (ref 8.4–10.4)
CHLORIDE: 106 meq/L (ref 98–109)
CO2: 23 mEq/L (ref 22–29)
CREATININE: 1.4 mg/dL — AB (ref 0.6–1.1)
EGFR: 37 mL/min/{1.73_m2} — ABNORMAL LOW (ref >90)
Glucose: 337 mg/dl — ABNORMAL HIGH (ref 70–140)
Potassium: 4.8 mEq/L (ref 3.5–5.1)
Sodium: 138 mEq/L (ref 136–145)
TOTAL PROTEIN: 5.9 g/dL — AB (ref 6.4–8.3)

## 2016-12-05 LAB — MAGNESIUM: MAGNESIUM: 2 mg/dL (ref 1.5–2.5)

## 2016-12-05 MED ORDER — HEPARIN SOD (PORK) LOCK FLUSH 100 UNIT/ML IV SOLN
500.0000 [IU] | Freq: Once | INTRAVENOUS | Status: AC | PRN
  Administered 2016-12-05: 500 [IU] via INTRAVENOUS
  Filled 2016-12-05: qty 5

## 2016-12-05 MED ORDER — SODIUM CHLORIDE 0.9 % IJ SOLN
10.0000 mL | INTRAMUSCULAR | Status: DC | PRN
  Administered 2016-12-05: 10 mL via INTRAVENOUS
  Filled 2016-12-05: qty 10

## 2016-12-05 NOTE — Telephone Encounter (Signed)
A copy of the appointment schedule was given to the patient, per 03/06/17 appointments. 12/05/16

## 2016-12-23 ENCOUNTER — Other Ambulatory Visit: Payer: Self-pay | Admitting: Internal Medicine

## 2016-12-30 ENCOUNTER — Telehealth: Payer: Self-pay | Admitting: Internal Medicine

## 2016-12-30 NOTE — Telephone Encounter (Signed)
See message below... Last Ov: 11/18/16 Last refilled on 12/02/16 with #90 Order is for Q6h prn.  Ok to refill? Please advise.

## 2016-12-30 NOTE — Telephone Encounter (Signed)
All okay.  If sample available

## 2016-12-30 NOTE — Telephone Encounter (Signed)
° ° °  Pt asked if she can get a sample of   Insulin Glargine (TOUJEO SOLOSTAR) 300 UNIT/ML SOPN   Pt request refill of the following:  HYDROcodone-acetaminophen (NORCO/VICODIN) 5-325 MG tablet  Pt is coming in tomorrow for a injection and is asking if she can pick up her rx and sample    Phamacy:

## 2016-12-31 ENCOUNTER — Ambulatory Visit (INDEPENDENT_AMBULATORY_CARE_PROVIDER_SITE_OTHER): Payer: Medicare HMO

## 2016-12-31 DIAGNOSIS — E538 Deficiency of other specified B group vitamins: Secondary | ICD-10-CM | POA: Diagnosis not present

## 2016-12-31 MED ORDER — HYDROCODONE-ACETAMINOPHEN 5-325 MG PO TABS: 1.0000 | ORAL_TABLET | Freq: Four times a day (QID) | ORAL | 0 refills | Status: DC | PRN

## 2016-12-31 MED ORDER — CYANOCOBALAMIN 1000 MCG/ML IJ SOLN
1000.0000 ug | Freq: Once | INTRAMUSCULAR | Status: AC
  Administered 2016-12-31: 1000 ug via INTRAMUSCULAR

## 2016-12-31 NOTE — Telephone Encounter (Signed)
Pt notified Rx ready for pickup. Rx printed and signed.  

## 2016-12-31 NOTE — Progress Notes (Signed)
Patient received her B12 injection for her Vit B12 deficiency on 12/31/16 at 12.25pm. Given by Rosealee Albee CMA.

## 2017-01-02 ENCOUNTER — Ambulatory Visit: Payer: Commercial Managed Care - HMO

## 2017-01-14 ENCOUNTER — Encounter: Payer: Self-pay | Admitting: Internal Medicine

## 2017-01-14 DIAGNOSIS — H18413 Arcus senilis, bilateral: Secondary | ICD-10-CM | POA: Diagnosis not present

## 2017-01-14 DIAGNOSIS — E119 Type 2 diabetes mellitus without complications: Secondary | ICD-10-CM | POA: Diagnosis not present

## 2017-01-14 DIAGNOSIS — H40013 Open angle with borderline findings, low risk, bilateral: Secondary | ICD-10-CM | POA: Diagnosis not present

## 2017-01-14 DIAGNOSIS — H11423 Conjunctival edema, bilateral: Secondary | ICD-10-CM | POA: Diagnosis not present

## 2017-01-14 DIAGNOSIS — H40053 Ocular hypertension, bilateral: Secondary | ICD-10-CM | POA: Diagnosis not present

## 2017-01-14 DIAGNOSIS — H11153 Pinguecula, bilateral: Secondary | ICD-10-CM | POA: Diagnosis not present

## 2017-01-14 DIAGNOSIS — H40003 Preglaucoma, unspecified, bilateral: Secondary | ICD-10-CM | POA: Diagnosis not present

## 2017-01-14 DIAGNOSIS — Z7984 Long term (current) use of oral hypoglycemic drugs: Secondary | ICD-10-CM | POA: Diagnosis not present

## 2017-01-14 DIAGNOSIS — Z9841 Cataract extraction status, right eye: Secondary | ICD-10-CM | POA: Diagnosis not present

## 2017-01-14 LAB — HM DIABETES EYE EXAM

## 2017-01-23 DIAGNOSIS — J90 Pleural effusion, not elsewhere classified: Secondary | ICD-10-CM | POA: Diagnosis not present

## 2017-01-23 DIAGNOSIS — J9601 Acute respiratory failure with hypoxia: Secondary | ICD-10-CM | POA: Diagnosis not present

## 2017-01-23 DIAGNOSIS — Z95828 Presence of other vascular implants and grafts: Secondary | ICD-10-CM | POA: Diagnosis not present

## 2017-01-23 DIAGNOSIS — E119 Type 2 diabetes mellitus without complications: Secondary | ICD-10-CM | POA: Diagnosis not present

## 2017-01-23 DIAGNOSIS — N183 Chronic kidney disease, stage 3 (moderate): Secondary | ICD-10-CM | POA: Diagnosis not present

## 2017-01-23 DIAGNOSIS — D696 Thrombocytopenia, unspecified: Secondary | ICD-10-CM | POA: Diagnosis not present

## 2017-01-23 DIAGNOSIS — C9201 Acute myeloblastic leukemia, in remission: Secondary | ICD-10-CM | POA: Diagnosis not present

## 2017-01-23 DIAGNOSIS — D631 Anemia in chronic kidney disease: Secondary | ICD-10-CM | POA: Diagnosis not present

## 2017-01-23 DIAGNOSIS — E1122 Type 2 diabetes mellitus with diabetic chronic kidney disease: Secondary | ICD-10-CM | POA: Diagnosis not present

## 2017-01-23 DIAGNOSIS — I10 Essential (primary) hypertension: Secondary | ICD-10-CM | POA: Diagnosis not present

## 2017-01-23 DIAGNOSIS — K5900 Constipation, unspecified: Secondary | ICD-10-CM | POA: Diagnosis not present

## 2017-01-23 DIAGNOSIS — I129 Hypertensive chronic kidney disease with stage 1 through stage 4 chronic kidney disease, or unspecified chronic kidney disease: Secondary | ICD-10-CM | POA: Diagnosis not present

## 2017-01-23 DIAGNOSIS — E538 Deficiency of other specified B group vitamins: Secondary | ICD-10-CM | POA: Diagnosis not present

## 2017-01-28 ENCOUNTER — Telehealth: Payer: Self-pay | Admitting: Internal Medicine

## 2017-01-28 NOTE — Telephone Encounter (Signed)
Pt request refill   HYDROcodone-acetaminophen (NORCO/VICODIN) 5-325 MG tablet  SAMPLE OF Insulin Glargine (TOUJEO SOLOSTAR) 300 UNIT/ML SOPN  Pt would like to pick up on 2/27 when she gets her B12 inj

## 2017-01-29 MED ORDER — HYDROCODONE-ACETAMINOPHEN 5-325 MG PO TABS: 1.0000 | ORAL_TABLET | Freq: Four times a day (QID) | ORAL | 0 refills | Status: DC | PRN

## 2017-01-29 MED ORDER — INSULIN GLARGINE 300 UNIT/ML ~~LOC~~ SOPN: 40.0000 [IU] | PEN_INJECTOR | Freq: Every day | SUBCUTANEOUS | 6 refills | Status: DC

## 2017-01-29 NOTE — Telephone Encounter (Signed)
Rx ready for pickup. Rx printed and signed.

## 2017-01-29 NOTE — Telephone Encounter (Signed)
Rx printed

## 2017-01-29 NOTE — Addendum Note (Signed)
Addended by: Abelardo Diesel on: 01/29/2017 12:44 PM   Modules accepted: Orders

## 2017-02-03 ENCOUNTER — Ambulatory Visit (INDEPENDENT_AMBULATORY_CARE_PROVIDER_SITE_OTHER): Payer: Medicare HMO

## 2017-02-03 ENCOUNTER — Telehealth: Payer: Self-pay | Admitting: Internal Medicine

## 2017-02-03 DIAGNOSIS — E538 Deficiency of other specified B group vitamins: Secondary | ICD-10-CM

## 2017-02-03 MED ORDER — CYANOCOBALAMIN 1000 MCG/ML IJ SOLN
1000.0000 ug | Freq: Once | INTRAMUSCULAR | Status: AC
  Administered 2017-02-03: 1000 ug via INTRAMUSCULAR

## 2017-02-03 NOTE — Progress Notes (Signed)
Patient got her B12 Injection for her Vit B12 deficiency on 02/03/2017 at 4.45PM. Given by Rosealee Albee CMA.

## 2017-02-03 NOTE — Telephone Encounter (Signed)
Medication Samples have been provided to the patient.  Toujeo   1 pen   LOT: [id number]   Exp.Date: 02/15/18    The patient has been instructed regarding the correct time, dose, and frequency of taking this medication, including desired effects and most common side effects.   [name] 4:56 PM 02/03/2017

## 2017-02-09 ENCOUNTER — Telehealth: Payer: Self-pay | Admitting: Internal Medicine

## 2017-02-09 NOTE — Telephone Encounter (Signed)
Pt need new Rx for metoprolol  Pharm:  [facility] pts state to please refill all other Rx's that are needed.

## 2017-02-10 MED ORDER — LISINOPRIL 10 MG PO TABS: 10.0000 mg | ORAL_TABLET | Freq: Every day | ORAL | 3 refills | Status: DC

## 2017-02-10 MED ORDER — GLIPIZIDE ER 5 MG PO TB24: 5.0000 mg | ORAL_TABLET | Freq: Every day | ORAL | 2 refills | Status: DC

## 2017-02-10 MED ORDER — METFORMIN HCL 500 MG PO TABS: 500.0000 mg | ORAL_TABLET | Freq: Two times a day (BID) | ORAL | 3 refills | Status: DC

## 2017-02-10 MED ORDER — PIOGLITAZONE HCL 15 MG PO TABS: 15.0000 mg | ORAL_TABLET | Freq: Every day | ORAL | 2 refills | Status: DC

## 2017-02-10 MED ORDER — AMLODIPINE BESYLATE 5 MG PO TABS: 5.0000 mg | ORAL_TABLET | Freq: Every day | ORAL | 1 refills | Status: DC

## 2017-02-10 MED ORDER — FUROSEMIDE 40 MG PO TABS: 40.0000 mg | ORAL_TABLET | Freq: Every day | ORAL | 1 refills | Status: DC

## 2017-02-10 MED ORDER — INSULIN GLARGINE 300 UNIT/ML ~~LOC~~ SOPN: 40.0000 [IU] | PEN_INJECTOR | Freq: Every day | SUBCUTANEOUS | 6 refills | Status: DC

## 2017-02-10 MED ORDER — METOPROLOL TARTRATE 50 MG PO TABS
50.0000 mg | ORAL_TABLET | Freq: Two times a day (BID) | ORAL | 1 refills | Status: DC
Start: 2017-02-10 — End: 2017-06-05

## 2017-02-10 MED ORDER — SIMVASTATIN 20 MG PO TABS: 20.0000 mg | ORAL_TABLET | Freq: Every evening | ORAL | 2 refills | Status: DC

## 2017-02-10 NOTE — Telephone Encounter (Signed)
All long term meds were refilled

## 2017-02-27 ENCOUNTER — Telehealth: Payer: Self-pay | Admitting: Internal Medicine

## 2017-02-27 NOTE — Telephone Encounter (Signed)
° ° ° ° ° °  Pt request refill of the following: ° °HYDROcodone-acetaminophen (NORCO/VICODIN) 5-325 MG tablet ° ° °Phamacy: °

## 2017-03-02 MED ORDER — HYDROCODONE-ACETAMINOPHEN 5-325 MG PO TABS: 1.0000 | ORAL_TABLET | Freq: Four times a day (QID) | ORAL | 0 refills | Status: DC | PRN

## 2017-03-02 NOTE — Telephone Encounter (Signed)
Pt notified Rx ready for pickup. Rx printed and signed.  

## 2017-03-03 ENCOUNTER — Telehealth: Payer: Self-pay | Admitting: Internal Medicine

## 2017-03-03 ENCOUNTER — Ambulatory Visit (INDEPENDENT_AMBULATORY_CARE_PROVIDER_SITE_OTHER): Payer: Medicare HMO

## 2017-03-03 DIAGNOSIS — E538 Deficiency of other specified B group vitamins: Secondary | ICD-10-CM | POA: Diagnosis not present

## 2017-03-03 MED ORDER — CYANOCOBALAMIN 1000 MCG/ML IJ SOLN
1000.0000 ug | Freq: Once | INTRAMUSCULAR | Status: AC
  Administered 2017-03-03: 1000 ug via INTRAMUSCULAR

## 2017-03-03 NOTE — Telephone Encounter (Signed)
Medication Samples have been provided to the patient.  Toujeo      Qty: 2 pens                                                  LOT: [id number]                                                       Exp.Date: 10/27/18  The patient has been instructed regarding the correct time, dose, and frequency of taking this medication, including desired effects and most common side effects.   [name] 4:45 PM 03/03/2017

## 2017-03-03 NOTE — Progress Notes (Signed)
Patient got her B12 injection for her Vit B 12 deficiency on 03/03/2017 at 4.45 pm. Given by Rosealee Albee CMA.Patient also has an appointment with Dr Raliegh Ip on 03/24/2017 and requested to have her B12 injection same day.

## 2017-03-05 ENCOUNTER — Other Ambulatory Visit: Payer: Self-pay | Admitting: *Deleted

## 2017-03-05 DIAGNOSIS — C9201 Acute myeloblastic leukemia, in remission: Secondary | ICD-10-CM

## 2017-03-06 ENCOUNTER — Telehealth: Payer: Self-pay | Admitting: Oncology

## 2017-03-06 ENCOUNTER — Other Ambulatory Visit: Payer: Self-pay | Admitting: *Deleted

## 2017-03-06 ENCOUNTER — Ambulatory Visit (HOSPITAL_BASED_OUTPATIENT_CLINIC_OR_DEPARTMENT_OTHER): Payer: Medicare HMO

## 2017-03-06 ENCOUNTER — Other Ambulatory Visit (HOSPITAL_BASED_OUTPATIENT_CLINIC_OR_DEPARTMENT_OTHER): Payer: Medicare HMO

## 2017-03-06 ENCOUNTER — Ambulatory Visit (HOSPITAL_BASED_OUTPATIENT_CLINIC_OR_DEPARTMENT_OTHER): Payer: Medicare HMO | Admitting: Oncology

## 2017-03-06 VITALS — BP 141/79 | HR 63 | Temp 97.9°F | Resp 18 | Ht 62.0 in | Wt 225.1 lb

## 2017-03-06 DIAGNOSIS — Z95828 Presence of other vascular implants and grafts: Secondary | ICD-10-CM | POA: Diagnosis not present

## 2017-03-06 DIAGNOSIS — E0821 Diabetes mellitus due to underlying condition with diabetic nephropathy: Secondary | ICD-10-CM

## 2017-03-06 DIAGNOSIS — Z452 Encounter for adjustment and management of vascular access device: Secondary | ICD-10-CM

## 2017-03-06 DIAGNOSIS — E1122 Type 2 diabetes mellitus with diabetic chronic kidney disease: Secondary | ICD-10-CM | POA: Diagnosis not present

## 2017-03-06 DIAGNOSIS — Z794 Long term (current) use of insulin: Secondary | ICD-10-CM

## 2017-03-06 DIAGNOSIS — C9201 Acute myeloblastic leukemia, in remission: Secondary | ICD-10-CM

## 2017-03-06 DIAGNOSIS — C9501 Acute leukemia of unspecified cell type, in remission: Secondary | ICD-10-CM | POA: Diagnosis not present

## 2017-03-06 LAB — COMPREHENSIVE METABOLIC PANEL
ALBUMIN: 3.3 g/dL — AB (ref 3.5–5.0)
ALK PHOS: 85 U/L (ref 40–150)
ALT: 14 U/L (ref 0–55)
AST: 13 U/L (ref 5–34)
Anion Gap: 5 mEq/L (ref 3–11)
BILIRUBIN TOTAL: 0.41 mg/dL (ref 0.20–1.20)
BUN: 33 mg/dL — AB (ref 7.0–26.0)
CALCIUM: 9 mg/dL (ref 8.4–10.4)
CO2: 27 mEq/L (ref 22–29)
CREATININE: 1.4 mg/dL — AB (ref 0.6–1.1)
Chloride: 108 mEq/L (ref 98–109)
EGFR: 37 mL/min/{1.73_m2} — ABNORMAL LOW (ref >90)
Glucose: 243 mg/dl — ABNORMAL HIGH (ref 70–140)
Potassium: 4.8 mEq/L (ref 3.5–5.1)
Sodium: 141 mEq/L (ref 136–145)
Total Protein: 6.2 g/dL — ABNORMAL LOW (ref 6.4–8.3)

## 2017-03-06 LAB — CBC WITH DIFFERENTIAL/PLATELET
BASO%: 0.2 % (ref 0.0–2.0)
BASOS ABS: 0 10*3/uL (ref 0.0–0.1)
EOS%: 2.1 % (ref 0.0–7.0)
Eosinophils Absolute: 0.1 10*3/uL (ref 0.0–0.5)
HEMATOCRIT: 33.7 % — AB (ref 34.8–46.6)
HEMOGLOBIN: 10.7 g/dL — AB (ref 11.6–15.9)
LYMPH#: 1.3 10*3/uL (ref 0.9–3.3)
LYMPH%: 24 % (ref 14.0–49.7)
MCH: 30.1 pg (ref 25.1–34.0)
MCHC: 31.8 g/dL (ref 31.5–36.0)
MCV: 94.7 fL (ref 79.5–101.0)
MONO#: 0.5 10*3/uL (ref 0.1–0.9)
MONO%: 9.4 % (ref 0.0–14.0)
NEUT#: 3.4 10*3/uL (ref 1.5–6.5)
NEUT%: 64.3 % (ref 38.4–76.8)
Platelets: 134 10*3/uL — ABNORMAL LOW (ref 145–400)
RBC: 3.56 10*6/uL — ABNORMAL LOW (ref 3.70–5.45)
RDW: 14 % (ref 11.2–14.5)
WBC: 5.3 10*3/uL (ref 3.9–10.3)

## 2017-03-06 MED ORDER — HEPARIN SOD (PORK) LOCK FLUSH 100 UNIT/ML IV SOLN
500.0000 [IU] | Freq: Once | INTRAVENOUS | Status: AC
  Administered 2017-03-06: 500 [IU] via INTRAVENOUS
  Filled 2017-03-06: qty 5

## 2017-03-06 MED ORDER — SODIUM CHLORIDE 0.9% FLUSH
10.0000 mL | INTRAVENOUS | Status: DC | PRN
  Administered 2017-03-06: 10 mL via INTRAVENOUS
  Filled 2017-03-06: qty 10

## 2017-03-06 NOTE — Progress Notes (Signed)
  [facility] OFFICE PROGRESS NOTE   Diagnosis: AML  INTERVAL HISTORY:   [name] returns as scheduled. She complains of chronic low back pain that is worse with standing. She has swelling in the legs and exertional dyspnea. She continues close follow-up with Dr. Florene Glen for the leukemia.  Objective:  Vital signs in last 24 hours:  Blood pressure (!) 141/79, pulse 63, temperature 97.9 F (36.6 C), temperature source Oral, resp. rate 18, height 5\' 2"  (1.575 m), weight 225 lb 1.6 oz (102.1 kg), SpO2 94 %.    HEENT: Neck without mass Resp: Distant breath sounds, no respiratory distress Cardio: Distant heart sounds, regular rate and rhythm GI: No hepatosplenomegaly Vascular: Trace pitting edema at the lower leg bilaterally   Lab Results:  Lab Results  Component Value Date   WBC 5.3 03/06/2017   HGB 10.7 (L) 03/06/2017   HCT 33.7 (L) 03/06/2017   MCV 94.7 03/06/2017   PLT 134 (L) 03/06/2017   NEUTROABS 3.4 03/06/2017     Medications: I have reviewed the patient's current medications.  Assessment/Plan: 1. 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  Status post cycle 3 high-dose araC consolidation 08/22/2014.  Bone marrow 09/15/2014 showed necrotic marrow with minute focus of myeloid precursors. No evidence of viable acute leukemia. 2. Pancytopenia secondary to chemotherapy. Improved.  3. C. difficile colitis-06/09/2014 treated with Flagyl  4. Diabetes  5. renal insufficiency  6. history of hypokalemia and hypomagnesemia. 7. history of atrial fibrillation  9. history of coronary artery disease  10.  Hospitalization with febrile neutropenia/sepsis secondary to Escherichia coli bacteremia 09/02/2014. 11. Shingles involving the right back October 2015. 12. Meningioma on brain CT 05/24/2014 and brain MRI 05/24/2014    Disposition:  [name] remains in clinical remission from AML. She will see Dr. Florene Glen in 6 weeks and return for an office visit/Port-A-Cath flush year in 12 weeks. She is scheduled to see Dr.Kwiatkowski On 03/24/2017. I recommended she discuss the low back pain and exertional dyspnea with him.  15 minutes were spent with the patient today. The majority of the time was used for counseling and coordination of care.  Betsy Coder, MD  03/06/2017  1:05 PM

## 2017-03-06 NOTE — Patient Instructions (Signed)
Implanted Port Insertion, Care After °This sheet gives you information about how to care for yourself after your procedure. Your health care provider may also give you more specific instructions. If you have problems or questions, contact your health care provider. °What can I expect after the procedure? °After your procedure, it is common to have: °· Discomfort at the port insertion site. °· Bruising on the skin over the port. This should improve over 3-4 days. ° °Follow these instructions at home: °Port care °· After your port is placed, you will get a manufacturer's information card. The card has information about your port. Keep this card with you at all times. °· Take care of the port as told by your health care provider. Ask your health care provider if you or a family member can get training for taking care of the port at home. A home health care nurse may also take care of the port. °· Make sure to remember what type of port you have. °Incision care °· Follow instructions from your health care provider about how to take care of your port insertion site. Make sure you: °? Wash your hands with soap and water before you change your bandage (dressing). If soap and water are not available, use hand sanitizer. °? Change your dressing as told by your health care provider. °? Leave stitches (sutures), skin glue, or adhesive strips in place. These skin closures may need to stay in place for 2 weeks or longer. If adhesive strip edges start to loosen and curl up, you may trim the loose edges. Do not remove adhesive strips completely unless your health care provider tells you to do that. °· Check your port insertion site every day for signs of infection. Check for: °? More redness, swelling, or pain. °? More fluid or blood. °? Warmth. °? Pus or a bad smell. °General instructions °· Do not take baths, swim, or use a hot tub until your health care provider approves. °· Do not lift anything that is heavier than 10 lb (4.5  kg) for a week, or as told by your health care provider. °· Ask your health care provider when it is okay to: °? Return to work or school. °? Resume usual physical activities or sports. °· Do not drive for 24 hours if you were given a medicine to help you relax (sedative). °· Take over-the-counter and prescription medicines only as told by your health care provider. °· Wear a medical alert bracelet in case of an emergency. This will tell any health care providers that you have a port. °· Keep all follow-up visits as told by your health care provider. This is important. °Contact a health care provider if: °· You cannot flush your port with saline as directed, or you cannot draw blood from the port. °· You have a fever or chills. °· You have more redness, swelling, or pain around your port insertion site. °· You have more fluid or blood coming from your port insertion site. °· Your port insertion site feels warm to the touch. °· You have pus or a bad smell coming from the port insertion site. °Get help right away if: °· You have chest pain or shortness of breath. °· You have bleeding from your port that you cannot control. °Summary °· Take care of the port as told by your health care provider. °· Change your dressing as told by your health care provider. °· Keep all follow-up visits as told by your health care provider. °  This information is not intended to replace advice given to you by your health care provider. Make sure you discuss any questions you have with your health care provider. °Document Released: 09/14/2013 Document Revised: 10/15/2016 Document Reviewed: 10/15/2016 °Elsevier Interactive Patient Education © 2017 [employer] ° °

## 2017-03-06 NOTE — Telephone Encounter (Signed)
Gave patient avs report and appointments for June. Per patient she does not need another flush scheduled before returning in June as it will be done in [place].

## 2017-03-07 LAB — HEMOGLOBIN A1C
Est. average glucose Bld gHb Est-mCnc: 160 mg/dL
Hemoglobin A1c: 7.2 % — ABNORMAL HIGH (ref 4.8–5.6)

## 2017-03-24 ENCOUNTER — Encounter: Payer: Self-pay | Admitting: Internal Medicine

## 2017-03-24 ENCOUNTER — Ambulatory Visit (INDEPENDENT_AMBULATORY_CARE_PROVIDER_SITE_OTHER): Payer: Medicare HMO | Admitting: Internal Medicine

## 2017-03-24 VITALS — BP 140/64 | HR 80 | Temp 97.7°F | Wt 225.6 lb

## 2017-03-24 DIAGNOSIS — I10 Essential (primary) hypertension: Secondary | ICD-10-CM | POA: Diagnosis not present

## 2017-03-24 DIAGNOSIS — I4891 Unspecified atrial fibrillation: Secondary | ICD-10-CM

## 2017-03-24 DIAGNOSIS — E538 Deficiency of other specified B group vitamins: Secondary | ICD-10-CM | POA: Diagnosis not present

## 2017-03-24 DIAGNOSIS — J441 Chronic obstructive pulmonary disease with (acute) exacerbation: Secondary | ICD-10-CM | POA: Diagnosis not present

## 2017-03-24 DIAGNOSIS — Z794 Long term (current) use of insulin: Secondary | ICD-10-CM | POA: Diagnosis not present

## 2017-03-24 DIAGNOSIS — R0609 Other forms of dyspnea: Secondary | ICD-10-CM

## 2017-03-24 DIAGNOSIS — M545 Low back pain, unspecified: Secondary | ICD-10-CM

## 2017-03-24 DIAGNOSIS — E0821 Diabetes mellitus due to underlying condition with diabetic nephropathy: Secondary | ICD-10-CM | POA: Diagnosis not present

## 2017-03-24 DIAGNOSIS — G8929 Other chronic pain: Secondary | ICD-10-CM | POA: Diagnosis not present

## 2017-03-24 MED ORDER — INSULIN GLARGINE 300 UNIT/ML ~~LOC~~ SOPN: 40.0000 [IU] | PEN_INJECTOR | Freq: Every day | SUBCUTANEOUS | 6 refills | Status: DC

## 2017-03-24 MED ORDER — CYANOCOBALAMIN 1000 MCG/ML IJ SOLN
1000.0000 ug | Freq: Once | INTRAMUSCULAR | Status: AC
  Administered 2017-03-24: 1000 ug via INTRAMUSCULAR

## 2017-03-24 MED ORDER — HYDROCODONE-ACETAMINOPHEN 5-325 MG PO TABS: 1.0000 | ORAL_TABLET | Freq: Four times a day (QID) | ORAL | 0 refills | Status: DC | PRN

## 2017-03-24 NOTE — Progress Notes (Signed)
Pre visit review using our clinic review tool, if applicable. No additional management support is needed unless otherwise documented below in the visit note. 

## 2017-03-24 NOTE — Patient Instructions (Addendum)
WE NOW OFFER   [facility] FAST TRACK!!!  SAME DAY Appointments for ACUTE CARE  Such as: Sprains, Injuries, cuts, abrasions, rashes, muscle pain, joint pain, back pain Colds, flu, sore throats, headache, allergies, cough, fever  Ear pain, sinus and eye infections Abdominal pain, nausea, vomiting, diarrhea, upset stomach Animal/insect bites  3 Easy Ways to Schedule: Walk-In Scheduling Call in scheduling Mychart Sign-up: [email or web address]      Please check your hemoglobin A1c every 3 months  Limit your sodium (Salt) intake  You need to lose weight.  Consider a lower calorie diet and regular exercise.  Pulmonary function studies as discussed  Chest x-ray  Now  Lumbar MRI

## 2017-03-24 NOTE — Progress Notes (Signed)
Subjective:    Patient ID: [name], female    DOB: 06/29/1941, 76 y.o.   MRN: [id number]  HPI 76 year old patient who is seen today in follow-up.  She is followed by oncology with a history of AML in remission.  She has diabetes, kidney by renal disease, essential hypertension and dyslipidemia. Her chief complaint today is low back pain.  This has been present at least since 2015 but has worsened over the past several months.  Pain is aggravated by walking and relieved by rest.  She also describes some numbness in the legs with activity. She continues to have dyspnea on exertion.  Pulmonary function studies were ordered.  Will reorder today Denies any productive cough or wheezing.  Lab Results  Component Value Date   HGBA1C 7.2 (H) 03/06/2017    Wt Readings from Last 3 Encounters:  03/24/17 225 lb 9.6 oz (102.3 kg)  03/06/17 225 lb 1.6 oz (102.1 kg)  11/18/16 217 lb 12 oz (98.8 kg)   Past Medical History:  Diagnosis Date  . acute myeloblastic leukemia (AML) dx'd 06/2014  . Acute pancreatitis   . Blood transfusion without reported diagnosis 2015   Leukemia  . Chronic kidney disease, stage II (mild)   . Colon polyps 04/29/2010   TUBULAR ADENOMA AND A SERRATED ADENOMA  . CORONARY ARTERY DISEASE 12/24/2007  . DIABETES MELLITUS, TYPE II 07/13/2007  . HYPERLIPIDEMIA 12/24/2007  . HYPERTENSION 07/13/2007  . NEPHROLITHIASIS, HX OF 12/24/2007  . Unspecified disease of pancreas      Social History   Social History  . Marital status: Married    Spouse name: N/A  . Number of children: N/A  . Years of education: N/A   Occupational History  . Not on file.   Social History Main Topics  . Smoking status: Former Smoker    Types: Cigarettes    Quit date: 12/08/2000  . Smokeless tobacco: Never Used  . Alcohol use No  . Drug use: No  . Sexual activity: Not on file   Other Topics Concern  . Not on file   Social History Narrative  . No narrative on file    Past Surgical  History:  Procedure Laterality Date  . ABDOMINAL HYSTERECTOMY  unknown  . CARDIAC CATHETERIZATION  2002  . CATARACT EXTRACTION Bilateral   . COLONOSCOPY  2011  . EXCISIONAL HEMORRHOIDECTOMY  unknown  . NASAL SINUS SURGERY  unknown  . POLYPECTOMY  2011    Family History  Problem Relation Age of Onset  . Cancer Father     throat ca  . COPD Father   . Cancer Sister     uterine ca and breast ca  . Cancer Brother     throat  . Colon cancer Neg Hx     No Known Allergies  Current Outpatient Prescriptions on File Prior to Visit  Medication Sig Dispense Refill  . amLODipine (NORVASC) 5 MG tablet Take 1 tablet (5 mg total) by mouth daily. 90 tablet 1  . aspirin EC 325 MG tablet Take 325 mg by mouth daily.    . Blood Glucose Monitoring Suppl (TRUE METRIX AIR GLUCOSE METER) w/Device KIT USE AS DIRECTED 1 kit 0  . furosemide (LASIX) 40 MG tablet Take 1 tablet (40 mg total) by mouth daily. 90 tablet 1  . glipiZIDE (GLUCOTROL XL) 5 MG 24 hr tablet Take 1 tablet (5 mg total) by mouth daily with breakfast. 90 tablet 2  . lisinopril (PRINIVIL,ZESTRIL) 10 MG tablet Take  1 tablet (10 mg total) by mouth daily. 90 tablet 3  . LORazepam (ATIVAN) 0.5 MG tablet Take 1 tablet (0.5 mg total) by mouth every 6 (six) hours as needed for anxiety. 90 tablet 5  . magnesium oxide (MAG-OX) 400 (241.3 Mg) MG tablet Take 2 tablets (800 mg total) by mouth 2 (two) times daily. 360 tablet 3  . metFORMIN (GLUCOPHAGE) 500 MG tablet Take 1 tablet (500 mg total) by mouth 2 (two) times daily with a meal. 180 tablet 3  . metoprolol (LOPRESSOR) 50 MG tablet Take 1 tablet (50 mg total) by mouth 2 (two) times daily. 180 tablet 1  . nitroGLYCERIN (NITROSTAT) 0.4 MG SL tablet Place 1 tablet (0.4 mg total) under the tongue every 5 (five) minutes as needed for chest pain. 90 tablet 1  . pioglitazone (ACTOS) 15 MG tablet Take 1 tablet (15 mg total) by mouth daily. 90 tablet 2  . promethazine (PHENERGAN) 12.5 MG tablet Take 2  tablets (25 mg total) by mouth every 6 (six) hours as needed for nausea. 12 tablet 0  . simvastatin (ZOCOR) 20 MG tablet Take 1 tablet (20 mg total) by mouth every evening. 90 tablet 2  . TRUE METRIX BLOOD GLUCOSE TEST test strip USE TO CHECK BLOOD SUGAR 4 TIMES DAILY AND AS NEEDED  450 each 3   Current Facility-Administered Medications on File Prior to Visit  Medication Dose Route Frequency Provider Last Rate Last Dose  . sodium chloride 0.9 % 1,000 mL with magnesium sulfate 2 g infusion   Intravenous Continuous Ladene Artist, MD   Stopped at 08/04/14 1441    BP 140/64 (BP Location: Left Arm, Patient Position: Sitting, Cuff Size: Large)   Pulse 80   Temp 97.7 F (36.5 C) (Oral)   Wt 225 lb 9.6 oz (102.3 kg)   SpO2 96%   BMI 41.26 kg/m      Review of Systems  Constitutional: Positive for unexpected weight change.  HENT: Negative for congestion, dental problem, hearing loss, rhinorrhea, sinus pressure, sore throat and tinnitus.   Eyes: Negative for pain, discharge and visual disturbance.  Respiratory: Positive for shortness of breath. Negative for cough.   Cardiovascular: Negative for chest pain, palpitations and leg swelling.  Gastrointestinal: Negative for abdominal distention, abdominal pain, blood in stool, constipation, diarrhea, nausea and vomiting.  Genitourinary: Negative for difficulty urinating, dysuria, flank pain, frequency, hematuria, pelvic pain, urgency, vaginal bleeding, vaginal discharge and vaginal pain.  Musculoskeletal: Positive for back pain and gait problem. Negative for arthralgias and joint swelling.  Skin: Negative for rash.  Neurological: Negative for dizziness, syncope, speech difficulty, weakness, numbness and headaches.  Hematological: Negative for adenopathy.  Psychiatric/Behavioral: Negative for agitation, behavioral problems and dysphoric mood. The patient is not nervous/anxious.        Objective:   Physical Exam  Constitutional: She is oriented  to person, place, and time. She appears well-developed and well-nourished.  Obese Weight 225 Blood pressure 120/60 O2 saturation 96  HENT:  Head: Normocephalic.  Right Ear: External ear normal.  Left Ear: External ear normal.  Mouth/Throat: Oropharynx is clear and moist.  Eyes: Conjunctivae and EOM are normal. Pupils are equal, round, and reactive to light.  Neck: Normal range of motion. Neck supple. No thyromegaly present.  Cardiovascular: Normal rate, regular rhythm, normal heart sounds and intact distal pulses.   Pulmonary/Chest: Effort normal and breath sounds normal. No respiratory distress. She has no wheezes. She has no rales.  Dyspnea with mild exertion  Abdominal:  Soft. Bowel sounds are normal. She exhibits no mass. There is no tenderness.  Musculoskeletal: Normal range of motion.  Lymphadenopathy:    She has no cervical adenopathy.  Neurological: She is alert and oriented to person, place, and time.  Skin: Skin is warm and dry. No rash noted.  Psychiatric: She has a normal mood and affect. Her behavior is normal.          Assessment & Plan:   Progressive lumbar pain.  Request a lumbar MRI.  Agree this is indicated due to the severity and progression.  Rule out spinal stenosis and neurogenic claudication AML in remission Diabetes mellitus.  Samples of insulin dispensed Essential hypertension, stable DOE.  Will set up for pulmonary function studies B12 deficiency.  B12 injection, dispensed  Return in 3 months Weight loss encouraged  [name]

## 2017-03-25 ENCOUNTER — Telehealth: Payer: Self-pay | Admitting: Internal Medicine

## 2017-03-25 NOTE — Telephone Encounter (Signed)
[employer] unable to approve MRI lumbar spine at this time. Would like pt to try PT and anti inflammatory.for at least four weeks.  If Dr Raliegh Ip insists, it can be sent up for next level review.   Fax:  [phone]

## 2017-03-27 ENCOUNTER — Other Ambulatory Visit: Payer: Self-pay | Admitting: *Deleted

## 2017-03-27 ENCOUNTER — Ambulatory Visit (INDEPENDENT_AMBULATORY_CARE_PROVIDER_SITE_OTHER)
Admission: RE | Admit: 2017-03-27 | Discharge: 2017-03-27 | Disposition: A | Discharge status: Home / Self Care | Payer: Medicare HMO | Source: Ambulatory Visit | Attending: Internal Medicine | Admitting: Internal Medicine

## 2017-03-27 DIAGNOSIS — R0602 Shortness of breath: Secondary | ICD-10-CM | POA: Diagnosis not present

## 2017-03-27 DIAGNOSIS — R0609 Other forms of dyspnea: Secondary | ICD-10-CM

## 2017-03-27 NOTE — Telephone Encounter (Signed)
See message below, please advise.

## 2017-03-27 NOTE — Telephone Encounter (Signed)
Please call in a new prescription for meloxicam 7.5 milligrams #30 one daily

## 2017-03-30 MED ORDER — MELOXICAM 7.5 MG PO TABS: 7.5000 mg | ORAL_TABLET | Freq: Every day | ORAL | 0 refills | Status: DC

## 2017-03-30 NOTE — Telephone Encounter (Signed)
Spoke to the pt and advised she will need to try Mobic 7.5 mg daily for 4 weeks before insurance will approve MRI.  Pt agreed to try medication for the four weeks.  Will report back if medication is working or not.

## 2017-04-01 ENCOUNTER — Ambulatory Visit (INDEPENDENT_AMBULATORY_CARE_PROVIDER_SITE_OTHER): Payer: Medicare HMO | Admitting: Pulmonary Disease

## 2017-04-01 ENCOUNTER — Encounter: Payer: Self-pay | Admitting: Pulmonary Disease

## 2017-04-01 VITALS — BP 130/60 | HR 73 | Ht 62.0 in | Wt 225.2 lb

## 2017-04-01 DIAGNOSIS — R0609 Other forms of dyspnea: Secondary | ICD-10-CM

## 2017-04-01 DIAGNOSIS — Z6841 Body Mass Index (BMI) 40.0 and over, adult: Secondary | ICD-10-CM

## 2017-04-01 DIAGNOSIS — M549 Dorsalgia, unspecified: Secondary | ICD-10-CM | POA: Diagnosis not present

## 2017-04-01 DIAGNOSIS — R5381 Other malaise: Secondary | ICD-10-CM

## 2017-04-01 NOTE — Patient Instructions (Signed)
Will arrange for pulmonary function test and overnight oxygen test   Follow up in 4 weeks with Dr. Halford Chessman or Nurse Practitioner

## 2017-04-01 NOTE — Progress Notes (Signed)
Past surgical history She  has a past surgical history that includes Excisional hemorrhoidectomy (unknown); Abdominal hysterectomy (unknown); Nasal sinus surgery (unknown); Cardiac catheterization (2002); Cataract extraction (Bilateral); Colonoscopy (2011); and Polypectomy (2011).  Family history Her family history includes COPD in her father; Cancer in her brother, father, and sister.  Social history She  reports that she quit smoking about 16 years ago. Her smoking use included Cigarettes. She has a 20.00 pack-year smoking history. She has never used smokeless tobacco. She reports that she does not drink alcohol or use drugs.  ROS: 12 point ROS negative except below.  No Known Allergies  Current Outpatient Prescriptions on File Prior to Visit  Medication Sig  . amLODipine (NORVASC) 5 MG tablet Take 1 tablet (5 mg total) by mouth daily.  Marland Kitchen aspirin EC 325 MG tablet Take 325 mg by mouth daily.  . Blood Glucose Monitoring Suppl (TRUE METRIX AIR GLUCOSE METER) w/Device KIT USE AS DIRECTED  . furosemide (LASIX) 40 MG tablet Take 1 tablet (40 mg total) by mouth daily.  Marland Kitchen glipiZIDE (GLUCOTROL XL) 5 MG 24 hr tablet Take 1 tablet (5 mg total) by mouth daily with breakfast.  . HYDROcodone-acetaminophen (NORCO/VICODIN) 5-325 MG tablet Take 1 tablet by mouth every 6 (six) hours as needed for moderate pain.  . Insulin Glargine (TOUJEO SOLOSTAR) 300 UNIT/ML SOPN Inject 40 Units into the skin at bedtime.  Marland Kitchen lisinopril (PRINIVIL,ZESTRIL) 10 MG tablet Take 1 tablet (10 mg total) by mouth daily.  Marland Kitchen LORazepam (ATIVAN) 0.5 MG tablet Take 1 tablet (0.5 mg total) by mouth every 6 (six) hours as needed for anxiety.  . magnesium oxide (MAG-OX) 400 (241.3 Mg) MG tablet Take 2 tablets (800 mg total) by mouth 2 (two) times daily.  . meloxicam (MOBIC) 7.5 MG tablet Take 1 tablet (7.5 mg total) by mouth daily.  . metFORMIN (GLUCOPHAGE) 500 MG tablet Take 1 tablet (500 mg total) by mouth 2 (two) times daily with a  meal.  . metoprolol (LOPRESSOR) 50 MG tablet Take 1 tablet (50 mg total) by mouth 2 (two) times daily.  . nitroGLYCERIN (NITROSTAT) 0.4 MG SL tablet Place 1 tablet (0.4 mg total) under the tongue every 5 (five) minutes as needed for chest pain.  . pioglitazone (ACTOS) 15 MG tablet Take 1 tablet (15 mg total) by mouth daily.  . promethazine (PHENERGAN) 12.5 MG tablet Take 2 tablets (25 mg total) by mouth every 6 (six) hours as needed for nausea.  . simvastatin (ZOCOR) 20 MG tablet Take 1 tablet (20 mg total) by mouth every evening.  . TRUE METRIX BLOOD GLUCOSE TEST test strip USE TO CHECK BLOOD SUGAR 4 TIMES DAILY AND AS NEEDED    Current Facility-Administered Medications on File Prior to Visit  Medication  . sodium chloride 0.9 % 1,000 mL with magnesium sulfate 2 g infusion    Chief Complaint  Patient presents with  . PULMONARY CONSULT    Referred by [facility] for bronchitis. Recent CXR 4/20. Pt states that her breathing is worse when she has increased back pain.     Cardiac tests Echo 08/27/16 >> mild LVH, EF 60 to 65%, grade 1 DD  Past medical history She  has a past medical history of acute myeloblastic leukemia (AML) (dx'd 06/2014); Acute pancreatitis; Blood transfusion without reported diagnosis (2015); Chronic kidney disease, stage II (mild); Colon polyps (04/29/2010); CORONARY ARTERY DISEASE (12/24/2007); DIABETES MELLITUS, TYPE II (07/13/2007); HYPERLIPIDEMIA (12/24/2007); HYPERTENSION (07/13/2007); NEPHROLITHIASIS, HX OF (12/24/2007); and Unspecified disease of pancreas.  Vital  signs BP 130/60 (BP Location: Right Arm, Cuff Size: Large)   Pulse 73   Ht _0  (1.575 m)   Wt 225 lb 3.2 oz (102.2 kg)   SpO2 94%   BMI 41.19 kg/m   History of present illness [name] is a 76 y.o. female with dyspnea.  She has noticed trouble with her breathing with exertion.  She starts getting back pain and then feels her breathing gets worse.  She recovers quickly after resting.  She denies chest pain  or palpitations.  She gets episodes of wheezing.  She quit smoking several years ago.  She was never told she had asthma, or COPD.  She had PFT scheduled, but hasn't had this done yet.  She had chest xray 03/27/17 that was unremarkable.  She has history of leukemia, but this has been in remission.  She denies history of pneumonia or exposure to tuberculosis.  She has mild ankle swelling, but this no worse than usual.  She denies history of thromboembolic disease.  As a result of her back pain she is not very active.  Ambulatory oximetry attempted today.  She was only able to walk 1 lap.  Had to stop due to back pain and leg pain.  SpO2 low was 95% on room air.  Physical exam  General - No distress Eyes - wears glasses, pupils reactive ENT - No sinus tenderness, no oral exudate, no LAN, no thyromegaly, TM clear, pupils equal/reactive Cardiac - s1s2 regular, no murmur, pulses symmetric Chest - No wheeze/rales/dullness, good air entry, normal respiratory excursion Back - No focal tenderness Abd - Soft, non-tender, no organomegaly, + bowel sounds Ext - 1+ edema Neuro - Normal strength, cranial nerves intact Skin - No rashes Psych - Normal mood, and behavior   CMP Latest Ref Rng & Units 03/06/2017 12/05/2016 08/25/2016  Glucose 70 - 140 mg/dl 243(H) 337(H) 169(H)  BUN 7.0 - 26.0 mg/dL 33.0(H) 28.8(H) 21.1  Creatinine 0.6 - 1.1 mg/dL 1.4(H) 1.4(H) 1.4(H)  Sodium 136 - 145 mEq/L 141 138 143  Potassium 3.5 - 5.1 mEq/L 4.8 4.8 4.4  Chloride 96 - 112 mEq/L - - -  CO2 22 - 29 mEq/L _1 Calcium 8.4 - 10.4 mg/dL 9.0 8.6 9.2  Total Protein 6.4 - 8.3 g/dL 6.2(L) 5.9(L) 6.3(L)  Total Bilirubin 0.20 - 1.20 mg/dL 0.41 0.46 0.45  Alkaline Phos 40 - 150 U/L 85 90 87  AST 5 - 34 U/L _2 ALT 0 - 55 U/L _3 CBC Latest Ref Rng & Units 03/06/2017 12/05/2016 08/25/2016  WBC 3.9 - 10.3 10e3/uL 5.3 5.6 7.1  Hemoglobin 11.6 - 15.9 g/dL 10.7(L) 10.2(L) 12.0  Hematocrit 34.8 - 46.6 % 33.7(L)  31.8(L) 36.5  Platelets 145 - 400 10e3/uL 134(L) 133(L) 130(L)    Dg Chest 2 View  Result Date: 03/27/2017 CLINICAL DATA:  76 year old female with shortness of breath for 3 months. Former smoker. History of leukemia. EXAM: CHEST  2 VIEW COMPARISON:  05/24/2014 FINDINGS: Right chest stool lumen power port remains in place. Small bilateral pleural effusions seen in 2015 have resolved. Mediastinal contours are within normal limits. No pneumothorax, pulmonary edema, pleural effusion or confluent pulmonary opacity. No acute osseous abnormality identified. Negative visible bowel gas pattern. Calcified aortic atherosclerosis. In the abdomen. IMPRESSION: 1.  No acute cardiopulmonary abnormality. 2.  Calcified aortic atherosclerosis. Electronically Signed   By: Genevie Ann M.D.   On: 03/27/2017 17:17  Discussion She has progressive dyspnea. She has prior history of smoking.  She is obese and relatively inactive.  She reports her respiratory symptoms getting worse since developed back pain.   Assessment/plan  Dyspnea on exertion. - will arrange for pulmonary function test and overnight oximetry - she might need further cardiac assessment if pulmonary assessment is unrevealing  Morbid obesity with deconditioning. - she would likely benefit from a monitor exercise program - discussed importance of weight loss  Back pain. - advised her to f/u with her PCP   Patient Instructions  Will arrange for pulmonary function test and overnight oxygen test   Follow up in 4 weeks with Dr. Halford Chessman or Nurse Practitioner    Chesley Mires, MD [place] [facility]/Critical Care/Sleep Pager:  [phone] 04/01/2017, 4:48 PM

## 2017-04-06 ENCOUNTER — Telehealth: Payer: Self-pay | Admitting: Pulmonary Disease

## 2017-04-06 NOTE — Telephone Encounter (Signed)
Spoke with pt, answered questions regarding PFT and ONO that she has scheduled.  Nothing further needed.

## 2017-04-08 ENCOUNTER — Ambulatory Visit (INDEPENDENT_AMBULATORY_CARE_PROVIDER_SITE_OTHER): Payer: Medicare HMO | Admitting: Pulmonary Disease

## 2017-04-08 DIAGNOSIS — R0609 Other forms of dyspnea: Secondary | ICD-10-CM | POA: Diagnosis not present

## 2017-04-08 NOTE — Progress Notes (Signed)
PFT done today. 

## 2017-04-16 ENCOUNTER — Telehealth: Payer: Self-pay | Admitting: Internal Medicine

## 2017-04-16 NOTE — Telephone Encounter (Signed)
Pt needs new rx hydrocodone and would like to pick up on 04-24-17. Pt has an appt for injection

## 2017-04-17 ENCOUNTER — Other Ambulatory Visit: Payer: Self-pay

## 2017-04-17 MED ORDER — HYDROCODONE-ACETAMINOPHEN 5-325 MG PO TABS: 1.0000 | ORAL_TABLET | Freq: Four times a day (QID) | ORAL | 0 refills | Status: DC | PRN

## 2017-04-17 NOTE — Telephone Encounter (Signed)
Rx approved, printed awaiting for doctor K signature.

## 2017-04-17 NOTE — Telephone Encounter (Signed)
Rx ready for pick up at the front desk. Patient aware.

## 2017-04-17 NOTE — Telephone Encounter (Signed)
Okay for refill?  

## 2017-04-21 ENCOUNTER — Telehealth: Payer: Self-pay | Admitting: Pulmonary Disease

## 2017-04-21 LAB — PULMONARY FUNCTION TEST
DL/VA % PRED: 106 %
DL/VA: 4.55 ml/min/mmHg/L
DLCO COR % PRED: 79 %
DLCO UNC: 14.51 ml/min/mmHg
DLCO cor: 15.32 ml/min/mmHg
DLCO unc % pred: 75 %
FEF 25-75 PRE: 0.49 L/s
FEF 25-75 Post: 0.68 L/sec
FEF2575-%Change-Post: 40 %
FEF2575-%PRED-POST: 47 %
FEF2575-%PRED-PRE: 34 %
FEV1-%Change-Post: 11 %
FEV1-%PRED-POST: 51 %
FEV1-%Pred-Pre: 46 %
FEV1-Post: 0.91 L
FEV1-Pre: 0.81 L
FEV1FVC-%Change-Post: -2 %
FEV1FVC-%PRED-PRE: 89 %
FEV6-%CHANGE-POST: 15 %
FEV6-%PRED-POST: 61 %
FEV6-%Pred-Pre: 53 %
FEV6-POST: 1.38 L
FEV6-Pre: 1.2 L
FEV6FVC-%CHANGE-POST: 0 %
FEV6FVC-%PRED-POST: 106 %
FEV6FVC-%Pred-Pre: 105 %
FVC-%CHANGE-POST: 14 %
FVC-%PRED-POST: 58 %
FVC-%Pred-Pre: 50 %
FVC-Post: 1.38 L
FVC-Pre: 1.2 L
POST FEV1/FVC RATIO: 66 %
PRE FEV1/FVC RATIO: 67 %
Post FEV6/FVC ratio: 100 %
Pre FEV6/FVC Ratio: 99 %

## 2017-04-21 NOTE — Telephone Encounter (Signed)
PFT 04/08/17 >> FEV1 0.91 (51%), FEV1% 66, TLC 3.54 (78%), DLCO 75%  Will have my nurse inform pt that PFT showed severe airflow obstruction.  Will discuss in more detail at [facility] with [place] on 04/24/17.

## 2017-04-23 ENCOUNTER — Ambulatory Visit: Payer: Medicare HMO

## 2017-04-24 ENCOUNTER — Encounter: Payer: Self-pay | Admitting: Adult Health

## 2017-04-24 ENCOUNTER — Ambulatory Visit (INDEPENDENT_AMBULATORY_CARE_PROVIDER_SITE_OTHER): Payer: Medicare HMO | Admitting: *Deleted

## 2017-04-24 ENCOUNTER — Ambulatory Visit: Payer: Medicare HMO

## 2017-04-24 ENCOUNTER — Ambulatory Visit (INDEPENDENT_AMBULATORY_CARE_PROVIDER_SITE_OTHER): Payer: Medicare HMO | Admitting: Adult Health

## 2017-04-24 DIAGNOSIS — J449 Chronic obstructive pulmonary disease, unspecified: Secondary | ICD-10-CM

## 2017-04-24 DIAGNOSIS — E538 Deficiency of other specified B group vitamins: Secondary | ICD-10-CM | POA: Diagnosis not present

## 2017-04-24 DIAGNOSIS — E785 Hyperlipidemia, unspecified: Secondary | ICD-10-CM | POA: Diagnosis not present

## 2017-04-24 DIAGNOSIS — I129 Hypertensive chronic kidney disease with stage 1 through stage 4 chronic kidney disease, or unspecified chronic kidney disease: Secondary | ICD-10-CM | POA: Diagnosis not present

## 2017-04-24 DIAGNOSIS — J9601 Acute respiratory failure with hypoxia: Secondary | ICD-10-CM | POA: Diagnosis not present

## 2017-04-24 DIAGNOSIS — E1122 Type 2 diabetes mellitus with diabetic chronic kidney disease: Secondary | ICD-10-CM | POA: Diagnosis not present

## 2017-04-24 DIAGNOSIS — C9201 Acute myeloblastic leukemia, in remission: Secondary | ICD-10-CM | POA: Diagnosis not present

## 2017-04-24 DIAGNOSIS — I2510 Atherosclerotic heart disease of native coronary artery without angina pectoris: Secondary | ICD-10-CM | POA: Diagnosis not present

## 2017-04-24 DIAGNOSIS — N183 Chronic kidney disease, stage 3 (moderate): Secondary | ICD-10-CM | POA: Diagnosis not present

## 2017-04-24 DIAGNOSIS — I480 Paroxysmal atrial fibrillation: Secondary | ICD-10-CM | POA: Diagnosis not present

## 2017-04-24 MED ORDER — CYANOCOBALAMIN 1000 MCG/ML IJ SOLN
1000.0000 ug | Freq: Once | INTRAMUSCULAR | Status: AC
  Administered 2017-04-24: 1000 ug via INTRAMUSCULAR

## 2017-04-24 MED ORDER — BUDESONIDE-FORMOTEROL FUMARATE 160-4.5 MCG/ACT IN AERO: 2.0000 | INHALATION_SPRAY | Freq: Two times a day (BID) | RESPIRATORY_TRACT | 0 refills | Status: DC

## 2017-04-24 NOTE — Progress Notes (Signed)
Patient walked in for b12 injection; verbally ok by Dr. Raliegh Ip to administer; administered b12 injection to rt deltoid IM; patient tolerated well; no s/s of reactions

## 2017-04-24 NOTE — Progress Notes (Signed)
_0  ID: [name], female    DOB: 1941/06/25, 76 y.o.   MRN: [id number]  Chief Complaint  Patient presents with  . Follow-up    dyspnea     Referring provider: Marletta Lor, MD  HPI: 76 year old female former smoker seen for pulmonary consult 04/01/2017 for dyspnea  Cardiac tests Echo 08/27/16 >> mild LVH, EF 60 to 65%, grade 1 DD  04/24/2017 Follow up ; Dyspnea  Patient returns for a one-month follow-up. Patient was seen last month for a pulmonary consult for shortness of breath. She had noticed that she was having more shortness of breath with activity. She does have back pain. Walk test last visit showed no desats on room air. Chest x-ray 03/27/2017 showed no acute process. Has a history of leukemia,  in remission PFTs done on 04/08/2017 showed moderate to severe airflow obstruction  with an FEV1 at 51%, ratio 66, FVC 58%, positive bronchodilator response, DLCO 75%. ONO was ordered but not done.  Smoked 1 PPD for at least 20 yr .  On ACE inhibitor , denies sign cough . Or wheezing .   No Known Allergies  Immunization History  Administered Date(s) Administered  . H1N1 01/05/2009  . Influenza Split 09/03/2012  . Influenza Whole 08/26/2009, 08/20/2010  . Influenza, High Dose Seasonal PF 09/14/2013  . Influenza,inj,Quad PF,36+ Mos 08/27/2015, 08/25/2016  . Influenza-Unspecified 09/15/2014  . Pneumococcal Conjugate-13 10/31/2013  . Pneumococcal Polysaccharide-23 11/18/2016  . Td 10/10/2010    Past Medical History:  Diagnosis Date  . acute myeloblastic leukemia (AML) dx'd 06/2014  . Acute pancreatitis   . Blood transfusion without reported diagnosis 2015   Leukemia  . Chronic kidney disease, stage II (mild)   . Colon polyps 04/29/2010   TUBULAR ADENOMA AND A SERRATED ADENOMA  . CORONARY ARTERY DISEASE 12/24/2007  . DIABETES MELLITUS, TYPE II 07/13/2007  . HYPERLIPIDEMIA 12/24/2007  . HYPERTENSION 07/13/2007  . NEPHROLITHIASIS, HX OF 12/24/2007  .  Unspecified disease of pancreas     Tobacco History: History  Smoking Status  . Former Smoker  . Packs/day: 1.00  . Years: 20.00  . Types: Cigarettes  . Quit date: 12/08/2000  Smokeless Tobacco  . Never Used   Counseling given: Not Answered   Outpatient Encounter Prescriptions as of 04/24/2017  Medication Sig  . amLODipine (NORVASC) 5 MG tablet Take 1 tablet (5 mg total) by mouth daily.  Marland Kitchen aspirin EC 325 MG tablet Take 325 mg by mouth daily.  . Blood Glucose Monitoring Suppl (TRUE METRIX AIR GLUCOSE METER) w/Device KIT USE AS DIRECTED  . furosemide (LASIX) 40 MG tablet Take 1 tablet (40 mg total) by mouth daily.  Marland Kitchen glipiZIDE (GLUCOTROL XL) 5 MG 24 hr tablet Take 1 tablet (5 mg total) by mouth daily with breakfast.  . HYDROcodone-acetaminophen (NORCO/VICODIN) 5-325 MG tablet Take 1 tablet by mouth every 6 (six) hours as needed for moderate pain.  . Insulin Glargine (TOUJEO SOLOSTAR) 300 UNIT/ML SOPN Inject 40 Units into the skin at bedtime.  Marland Kitchen lisinopril (PRINIVIL,ZESTRIL) 10 MG tablet Take 1 tablet (10 mg total) by mouth daily.  Marland Kitchen LORazepam (ATIVAN) 0.5 MG tablet Take 1 tablet (0.5 mg total) by mouth every 6 (six) hours as needed for anxiety.  . magnesium oxide (MAG-OX) 400 (241.3 Mg) MG tablet Take 2 tablets (800 mg total) by mouth 2 (two) times daily.  . meloxicam (MOBIC) 7.5 MG tablet Take 1 tablet (7.5 mg total) by mouth daily.  . metFORMIN (GLUCOPHAGE) 500 MG tablet  Take 1 tablet (500 mg total) by mouth 2 (two) times daily with a meal.  . metoprolol (LOPRESSOR) 50 MG tablet Take 1 tablet (50 mg total) by mouth 2 (two) times daily.  . nitroGLYCERIN (NITROSTAT) 0.4 MG SL tablet Place 1 tablet (0.4 mg total) under the tongue every 5 (five) minutes as needed for chest pain.  . pioglitazone (ACTOS) 15 MG tablet Take 1 tablet (15 mg total) by mouth daily.  . promethazine (PHENERGAN) 12.5 MG tablet Take 2 tablets (25 mg total) by mouth every 6 (six) hours as needed for nausea.  .  simvastatin (ZOCOR) 20 MG tablet Take 1 tablet (20 mg total) by mouth every evening.  . TRUE METRIX BLOOD GLUCOSE TEST test strip USE TO CHECK BLOOD SUGAR 4 TIMES DAILY AND AS NEEDED    Facility-Administered Encounter Medications as of 04/24/2017  Medication  . sodium chloride 0.9 % 1,000 mL with magnesium sulfate 2 g infusion     Review of Systems  Constitutional:   No  weight loss, night sweats,  Fevers, chills, + fatigue, or  lassitude.  HEENT:   No headaches,  Difficulty swallowing,  Tooth/dental problems, or  Sore throat,                No sneezing, itching, ear ache,  +nasal congestion, post nasal drip,   CV:  No chest pain,  Orthopnea, PND,  anasarca, dizziness, palpitations, syncope.   GI  No heartburn, indigestion, abdominal pain, nausea, vomiting, diarrhea, change in bowel habits, loss of appetite, bloody stools.   Resp:   No chest wall deformity  Skin: no rash or lesions.  GU: no dysuria, change in color of urine, no urgency or frequency.  No flank pain, no hematuria   MS:  No joint pain or swelling.  No decreased range of motion.  No back pain.    Physical Exam  BP 128/70 (BP Location: Left Arm, Cuff Size: Large)   Pulse 88   Ht 5' 0.25" (1.53 m)   Wt 226 lb 9.6 oz (102.8 kg)   SpO2 94%   BMI 43.89 kg/m   GEN: A/Ox3; pleasant , NAD, obese    HEENT:  [place]/AT,  EACs-clear, TMs-wnl, NOSE-clear, THROAT-clear, no lesions, no postnasal drip or exudate noted. Class 2-3 MP airway   NECK:  Supple w/ fair ROM; no JVD; normal carotid impulses w/o bruits; no thyromegaly or nodules palpated; no lymphadenopathy.    RESP  Clear  P & A; w/o, wheezes/ rales/ or rhonchi. no accessory muscle use, no dullness to percussion  CARD:  RRR, no m/r/g, 1+  peripheral edema, pulses intact, no cyanosis or clubbing.  GI:   Soft & nt; nml bowel sounds; no organomegaly or masses detected.   Musco: Warm bil, no deformities or joint swelling noted.   Neuro: alert, no focal deficits  noted.    Skin: Warm, no lesions or rashes    Lab Results:NP No results found for: BNP  ProBNP No results found for: PROBNP  Imaging: Dg Chest 2 View  Result Date: 03/27/2017 CLINICAL DATA:  76 year old female with shortness of breath for 3 months. Former smoker. History of leukemia. EXAM: CHEST  2 VIEW COMPARISON:  05/24/2014 FINDINGS: Right chest stool lumen power port remains in place. Small bilateral pleural effusions seen in 2015 have resolved. Mediastinal contours are within normal limits. No pneumothorax, pulmonary edema, pleural effusion or confluent pulmonary opacity. No acute osseous abnormality identified. Negative visible bowel gas pattern. Calcified aortic atherosclerosis. In the  abdomen. IMPRESSION: 1.  No acute cardiopulmonary abnormality. 2.  Calcified aortic atherosclerosis. Electronically Signed   By: Genevie Ann M.D.   On: 03/27/2017 17:17     Assessment & Plan:   COPD (chronic obstructive pulmonary disease) (HCC) Moderate to Severe COPD with suspected asthnma component  Begin Symbicort 2 puffs Twice daily   Check ONO .   Plan  Patient Instructions  Begin Symbicort 160/4.46mg 2 puffs Twice daily , rinse after use.  Set up overnight oxygen test.  Follow up up with Dr. SHalford Chessman In 2 months and As needed   Please contact office for sooner follow up if symptoms do not improve or worsen or seek emergency care         TRexene Edison NP 04/24/2017

## 2017-04-24 NOTE — Addendum Note (Signed)
Addended by: Parke Poisson E on: 04/24/2017 04:45 PM   Modules accepted: Orders

## 2017-04-24 NOTE — Patient Instructions (Addendum)
Begin Symbicort 160/4.60mcg 2 puffs Twice daily , rinse after use.  Set up overnight oxygen test.  Follow up up with Dr. Halford Chessman  In 2 months and As needed   Please contact office for sooner follow up if symptoms do not improve or worsen or seek emergency care

## 2017-04-24 NOTE — Assessment & Plan Note (Signed)
Moderate to Severe COPD with suspected asthnma component  Begin Symbicort 2 puffs Twice daily   Check ONO .   Plan  Patient Instructions  Begin Symbicort 160/4.70mcg 2 puffs Twice daily , rinse after use.  Set up overnight oxygen test.  Follow up up with Dr. Halford Chessman  In 2 months and As needed   Please contact office for sooner follow up if symptoms do not improve or worsen or seek emergency care

## 2017-04-25 ENCOUNTER — Telehealth: Payer: Self-pay | Admitting: Pulmonary Disease

## 2017-04-25 NOTE — Telephone Encounter (Signed)
Called by patient. Saw TP yesterday and was started on symbicort. C/O of some chest discomfort on deep inspiration right after she takes the inhaler.  Denies any SOB, cough, fevers, chills Advised to continue using the inahler. She will call us back if symptoms persist or worsen.  PM

## 2017-04-27 NOTE — Telephone Encounter (Signed)
LMOM TCB x1 to check on patient

## 2017-04-27 NOTE — Progress Notes (Signed)
I have reviewed and agree with assessment/plan.  Chesley Mires, MD [facility] Pulmonary/Critical Care 04/27/2017, 6:23 AM Pager:  [phone]

## 2017-04-28 NOTE — Telephone Encounter (Signed)
Reviewed with patient at 04/24/17 appt with TP. Nothing further needed.

## 2017-05-01 NOTE — Telephone Encounter (Signed)
LMOM TCB x2

## 2017-05-08 MED ORDER — BUDESONIDE-FORMOTEROL FUMARATE 160-4.5 MCG/ACT IN AERO: 2.0000 | INHALATION_SPRAY | Freq: Two times a day (BID) | RESPIRATORY_TRACT | 5 refills | Status: DC

## 2017-05-08 NOTE — Telephone Encounter (Signed)
Called pt , she is feeling better. Symbicort is helping .

## 2017-05-25 ENCOUNTER — Ambulatory Visit: Payer: Medicare HMO

## 2017-05-25 ENCOUNTER — Ambulatory Visit (INDEPENDENT_AMBULATORY_CARE_PROVIDER_SITE_OTHER): Payer: Medicare HMO | Admitting: *Deleted

## 2017-05-25 ENCOUNTER — Telehealth: Payer: Self-pay | Admitting: Internal Medicine

## 2017-05-25 DIAGNOSIS — E538 Deficiency of other specified B group vitamins: Secondary | ICD-10-CM

## 2017-05-25 MED ORDER — CYANOCOBALAMIN 1000 MCG/ML IJ SOLN
1000.0000 ug | Freq: Once | INTRAMUSCULAR | Status: AC
  Administered 2017-05-25: 1000 ug via INTRAMUSCULAR

## 2017-05-25 MED ORDER — HYDROCODONE-ACETAMINOPHEN 5-325 MG PO TABS
1.0000 | ORAL_TABLET | Freq: Four times a day (QID) | ORAL | 0 refills | Status: DC | PRN
Start: 2017-05-25 — End: 2017-06-26

## 2017-05-25 NOTE — Progress Notes (Signed)
Patient here for b12 injection; administered IM to left deltoid; patient tolerated well; no s/s of reactions

## 2017-05-25 NOTE — Telephone Encounter (Signed)
Medication Samples have been provided to the patient.  Toujeo       300 units/ML      One pen  LOT [id number]  Exp.Date: 08/07/18    The patient has been instructed regarding the correct time, dose, and frequency of taking this medication, including desired effects and most common side effects.   Abelardo Diesel 11:31 AM 05/25/2017

## 2017-05-25 NOTE — Telephone Encounter (Signed)
Pt notified Rx ready for pickup. Rx printed and signed.  

## 2017-05-25 NOTE — Telephone Encounter (Signed)
° ° ° ° ° °  Pt request refill of the following: ° °HYDROcodone-acetaminophen (NORCO/VICODIN) 5-325 MG tablet ° ° °Phamacy: °

## 2017-05-25 NOTE — Telephone Encounter (Signed)
Rx printed awaiting to be signed.  

## 2017-06-05 ENCOUNTER — Other Ambulatory Visit: Payer: Self-pay | Admitting: Internal Medicine

## 2017-06-05 ENCOUNTER — Other Ambulatory Visit: Payer: Medicare HMO

## 2017-06-05 ENCOUNTER — Ambulatory Visit: Payer: Medicare HMO | Admitting: Nurse Practitioner

## 2017-06-08 ENCOUNTER — Other Ambulatory Visit (HOSPITAL_BASED_OUTPATIENT_CLINIC_OR_DEPARTMENT_OTHER): Payer: Medicare HMO

## 2017-06-08 ENCOUNTER — Telehealth: Payer: Self-pay | Admitting: Nurse Practitioner

## 2017-06-08 ENCOUNTER — Ambulatory Visit (HOSPITAL_BASED_OUTPATIENT_CLINIC_OR_DEPARTMENT_OTHER): Payer: Medicare HMO | Admitting: Nurse Practitioner

## 2017-06-08 ENCOUNTER — Other Ambulatory Visit: Payer: Self-pay | Admitting: Internal Medicine

## 2017-06-08 ENCOUNTER — Ambulatory Visit: Payer: Medicare HMO

## 2017-06-08 ENCOUNTER — Other Ambulatory Visit: Payer: Self-pay | Admitting: Family Medicine

## 2017-06-08 ENCOUNTER — Other Ambulatory Visit: Payer: Self-pay

## 2017-06-08 VITALS — BP 151/65 | HR 64 | Temp 97.7°F | Resp 18 | Ht 60.25 in | Wt 229.9 lb

## 2017-06-08 DIAGNOSIS — Z452 Encounter for adjustment and management of vascular access device: Secondary | ICD-10-CM | POA: Diagnosis not present

## 2017-06-08 DIAGNOSIS — Z95828 Presence of other vascular implants and grafts: Secondary | ICD-10-CM

## 2017-06-08 DIAGNOSIS — C9201 Acute myeloblastic leukemia, in remission: Secondary | ICD-10-CM | POA: Diagnosis not present

## 2017-06-08 DIAGNOSIS — E119 Type 2 diabetes mellitus without complications: Secondary | ICD-10-CM | POA: Diagnosis not present

## 2017-06-08 DIAGNOSIS — E0821 Diabetes mellitus due to underlying condition with diabetic nephropathy: Secondary | ICD-10-CM

## 2017-06-08 DIAGNOSIS — Z794 Long term (current) use of insulin: Secondary | ICD-10-CM | POA: Diagnosis not present

## 2017-06-08 LAB — CBC WITH DIFFERENTIAL/PLATELET
BASO%: 0.1 % (ref 0.0–2.0)
BASOS ABS: 0 10*3/uL (ref 0.0–0.1)
EOS ABS: 0 10*3/uL (ref 0.0–0.5)
EOS%: 0.1 % (ref 0.0–7.0)
HEMATOCRIT: 32.2 % — AB (ref 34.8–46.6)
HEMOGLOBIN: 10.4 g/dL — AB (ref 11.6–15.9)
LYMPH%: 12.6 % — ABNORMAL LOW (ref 14.0–49.7)
MCH: 30.7 pg (ref 25.1–34.0)
MCHC: 32.4 g/dL (ref 31.5–36.0)
MCV: 94.7 fL (ref 79.5–101.0)
MONO#: 0.5 10*3/uL (ref 0.1–0.9)
MONO%: 7.4 % (ref 0.0–14.0)
NEUT#: 5.9 10*3/uL (ref 1.5–6.5)
NEUT%: 79.8 % — AB (ref 38.4–76.8)
Platelets: 146 10*3/uL (ref 145–400)
RBC: 3.4 10*6/uL — ABNORMAL LOW (ref 3.70–5.45)
RDW: 14.7 % — AB (ref 11.2–14.5)
WBC: 7.3 10*3/uL (ref 3.9–10.3)
lymph#: 0.9 10*3/uL (ref 0.9–3.3)

## 2017-06-08 MED ORDER — SODIUM CHLORIDE 0.9 % IJ SOLN
10.0000 mL | INTRAMUSCULAR | Status: DC | PRN
  Administered 2017-06-08: 10 mL via INTRAVENOUS
  Filled 2017-06-08: qty 10

## 2017-06-08 MED ORDER — INSULIN PEN NEEDLE 32G X 6 MM MISC: 11 refills | Status: DC

## 2017-06-08 MED ORDER — TRUE METRIX AIR GLUCOSE METER DEVI: 1.0000 | Freq: Three times a day (TID) | 0 refills | Status: DC | PRN

## 2017-06-08 MED ORDER — HEPARIN SOD (PORK) LOCK FLUSH 100 UNIT/ML IV SOLN
500.0000 [IU] | Freq: Once | INTRAVENOUS | Status: AC | PRN
  Administered 2017-06-08: 500 [IU] via INTRAVENOUS
  Filled 2017-06-08: qty 5

## 2017-06-08 NOTE — Progress Notes (Signed)
  [place] OFFICE PROGRESS NOTE   Diagnosis:  AML  INTERVAL HISTORY:   Ms. [name] returns as scheduled. No interim illnesses or infections. No bleeding. No fever. Continued back pain. Dyspnea on exertion is better since beginning an inhaler.  Objective:  Vital signs in last 24 hours:  Blood pressure (!) 151/65, pulse 64, temperature 97.7 F (36.5 C), temperature source Oral, resp. rate 18, height 5' 0.25" (1.53 m), weight 229 lb 14.4 oz (104.3 kg), SpO2 97 %.    HEENT: Neck without mass. Resp: Distant breath sounds. No respiratory distress. Cardio: Distant heart sounds. Regular rate and rhythm. GI: No hepatosplenomegaly. Vascular: Trace pitting edema at the lower legs bilaterally. Neuro: Alert and oriented.   Port-A-Cath without erythema.  Lab Results:  Lab Results  Component Value Date   WBC 7.3 06/08/2017   HGB 10.4 (L) 06/08/2017   HCT 32.2 (L) 06/08/2017   MCV 94.7 06/08/2017   PLT 146 06/08/2017   NEUTROABS 5.9 06/08/2017    Imaging:  No results found.  Medications: I have reviewed the patient's current medications.  Assessment/Plan: 1. 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  Status post cycle 3 high-dose araC consolidation 08/22/2014.  Bone marrow 09/15/2014 showed necrotic marrow with minute focus of myeloid precursors. No evidence of viable acute leukemia. 2. Pancytopenia secondary to chemotherapy. Improved.  3. C. difficile colitis-06/09/2014 treated with Flagyl  4. Diabetes  5. renal insufficiency  6. history of hypokalemia and hypomagnesemia. 7. history of atrial fibrillation  9.  history of coronary artery disease  10. Hospitalization with febrile neutropenia/sepsis secondary to Escherichia coli bacteremia 09/02/2014. 11. Shingles involving the right back October 2015. 12. Meningioma on brain CT 05/24/2014 and brain MRI 05/24/2014    Disposition: [name] remains in clinical remission from AML. She will see Dr. Florene Glen and have the Port-A-Cath flushed in 6 weeks. We will schedule a return visit and Port-A-Cath flush here in 12 weeks. She will contact the office in the interim with any problems.  Plan reviewed with Dr. [name].   Ned Card ANP/GNP-BC   06/08/2017  12:12 PM

## 2017-06-08 NOTE — Telephone Encounter (Signed)
Scheduled appt per 7/2 los - Gave patient AVS and calender per los.  

## 2017-06-08 NOTE — Patient Instructions (Signed)

## 2017-06-09 ENCOUNTER — Telehealth: Payer: Self-pay | Admitting: Internal Medicine

## 2017-06-09 NOTE — Telephone Encounter (Signed)
Pharmacy needs clarification on directions for  1. Blood Glucose Monitoring Suppl (TRUE METRIX AIR GLUCOSE METER) DEVI - Route: 1 applicator by Does not apply route 3 (three) times daily between meals as needed. - Does not apply  2.Insulin Pen Needle (BD ULTRA-FINE MICRO PEN NEEDLE) 32G X 6 MM MISC  Sig: Use to check Blood sugar TID and prn.  They do not understand these directions. Can you clarify?

## 2017-06-10 LAB — HGB A1C W/O EAG: Hgb A1c MFr Bld: 8.7 % — ABNORMAL HIGH (ref 4.8–5.6)

## 2017-06-26 ENCOUNTER — Encounter: Payer: Self-pay | Admitting: Internal Medicine

## 2017-06-26 ENCOUNTER — Ambulatory Visit (INDEPENDENT_AMBULATORY_CARE_PROVIDER_SITE_OTHER): Payer: Medicare HMO | Admitting: Internal Medicine

## 2017-06-26 VITALS — BP 144/58 | HR 82 | Temp 97.7°F | Ht 60.25 in | Wt 230.0 lb

## 2017-06-26 DIAGNOSIS — E0821 Diabetes mellitus due to underlying condition with diabetic nephropathy: Secondary | ICD-10-CM | POA: Diagnosis not present

## 2017-06-26 DIAGNOSIS — E538 Deficiency of other specified B group vitamins: Secondary | ICD-10-CM

## 2017-06-26 DIAGNOSIS — I10 Essential (primary) hypertension: Secondary | ICD-10-CM | POA: Diagnosis not present

## 2017-06-26 MED ORDER — INSULIN LISPRO 100 UNIT/ML (KWIKPEN): PEN_INJECTOR | SUBCUTANEOUS | 11 refills | Status: DC

## 2017-06-26 MED ORDER — CYANOCOBALAMIN 1000 MCG/ML IJ SOLN
1000.0000 ug | Freq: Once | INTRAMUSCULAR | Status: AC
  Administered 2017-06-26: 1000 ug via INTRAMUSCULAR

## 2017-06-26 MED ORDER — INSULIN GLARGINE 300 UNIT/ML ~~LOC~~ SOPN: 20.0000 [IU] | PEN_INJECTOR | Freq: Every day | SUBCUTANEOUS | 6 refills | Status: DC

## 2017-06-26 MED ORDER — HYDROCODONE-ACETAMINOPHEN 5-325 MG PO TABS: 1.0000 | ORAL_TABLET | Freq: Four times a day (QID) | ORAL | 0 refills | Status: DC | PRN

## 2017-06-26 NOTE — Patient Instructions (Addendum)
Discontinue glipizide  Decrease Toujeo to 20 units at bedtime  Mealtime insulin as instructed  Do not take any anti-inflammatory medications such as Mobic  Naproxen or ibuprofen    Return in one month for follow-up

## 2017-06-26 NOTE — Progress Notes (Signed)
Subjective:    Patient ID: [name], female    DOB: 13-Nov-1941, 76 y.o.   MRN: [id number]  HPI  Wt Readings from Last 3 Encounters:  06/26/17 230 lb (104.3 kg)  06/08/17 229 lb 14.4 oz (104.3 kg)  04/24/17 226 lb 9.6 oz (38.46 kg)   76 year old patient who is seen today for her quarterly follow-up She has a history of diabetes and last hemoglobin A1c was poorly controlled  Lab Results  Component Value Date   HGBA1C 8.7 (H) 06/08/2017   She has chronic kidney disease and remains on submaximal dose of metformin.  GFRs  have been approximately 37.  She states that she has had several episodes of nocturnal hypoglycemia with blood sugars in the fifties associated with diaphoresis and weakness. Medical regimen has  included glipizide in addition to basal insulin. Patient has chronic renal disease.  She continues to use anti-inflammatory medication sporadically.  She was counseled to permanently discontinue all anti-inflammatory medications. She continues to have chronic low back pain involving the cervical, thoracic and lumbar area.  She remains on hydrocodone analgesics She has essential hypertension. She has been seen by oncology.  Recently with history of AML in remission  She continues to be actively employed. Other complaints include pedal edema.  Medical regimen includes furosemide, amlodipine, and submaximal dose of Actos  Past Medical History:  Diagnosis Date  . acute myeloblastic leukemia (AML) dx'd 06/2014  . Acute pancreatitis   . Blood transfusion without reported diagnosis 2015   Leukemia  . Chronic kidney disease, stage II (mild)   . Colon polyps 04/29/2010   TUBULAR ADENOMA AND A SERRATED ADENOMA  . CORONARY ARTERY DISEASE 12/24/2007  . DIABETES MELLITUS, TYPE II 07/13/2007  . HYPERLIPIDEMIA 12/24/2007  . HYPERTENSION 07/13/2007  . NEPHROLITHIASIS, HX OF 12/24/2007  . Unspecified disease of pancreas      Social History   Social History  . Marital status: Married      Spouse name: N/A  . Number of children: N/A  . Years of education: N/A   Occupational History  . works in a lab     makes glasses   Social History Main Topics  . Smoking status: Former Smoker    Packs/day: 1.00    Years: 20.00    Types: Cigarettes    Quit date: 12/08/2000  . Smokeless tobacco: Never Used  . Alcohol use No  . Drug use: No  . Sexual activity: Not on file   Other Topics Concern  . 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 ABDOMINAL HYSTERECTOMY  unknown  . CARDIAC CATHETERIZATION  2002  . CATARACT EXTRACTION Bilateral   . COLONOSCOPY  2011  . EXCISIONAL HEMORRHOIDECTOMY  unknown  . NASAL SINUS SURGERY  unknown  . POLYPECTOMY  2011    Family History  Problem Relation Age of Onset  . Cancer Father        throat ca  . COPD Father   . Cancer Sister        uterine ca and breast ca  . Cancer Brother        throat  . Colon cancer Neg Hx     No Known Allergies  Current Outpatient Prescriptions on File Prior to Visit  Medication Sig Dispense Refill  . amLODipine (NORVASC) 5 MG tablet Take 1 tablet (5 mg total) by mouth daily.  90 tablet 1  . aspirin EC 325 MG tablet Take 325 mg by mouth daily.    . Blood Glucose Monitoring Suppl (TRUE METRIX AIR GLUCOSE METER) DEVI 1 applicator by Does not apply route 3 (three) times daily between meals as needed. 1 Device 0  . Blood Glucose Monitoring Suppl (TRUE METRIX AIR GLUCOSE METER) w/Device KIT USE AS DIRECTED 1 kit 0  . furosemide (LASIX) 40 MG tablet Take 1 tablet (40 mg total) by mouth daily. 90 tablet 1  . Insulin Pen Needle (BD ULTRA-FINE MICRO PEN NEEDLE) 32G X 6 MM MISC Use to check Blood sugar TID and prn. 100 each 11  . lisinopril (PRINIVIL,ZESTRIL) 10 MG tablet Take 1 tablet (10 mg total) by mouth daily. 90 tablet 3  . LORazepam (ATIVAN) 0.5 MG tablet Take 1 tablet (0.5 mg total) by mouth every 6  (six) hours as needed for anxiety. 90 tablet 5  . magnesium oxide (MAG-OX) 400 (241.3 Mg) MG tablet Take 2 tablets (800 mg total) by mouth 2 (two) times daily. 360 tablet 3  . metFORMIN (GLUCOPHAGE) 500 MG tablet Take 1 tablet (500 mg total) by mouth 2 (two) times daily with a meal. 180 tablet 3  . metoprolol tartrate (LOPRESSOR) 50 MG tablet TAKE 1 TABLET (50 MG TOTAL) BY MOUTH 2 (TWO) TIMES DAILY. 180 tablet 1  . nitroGLYCERIN (NITROSTAT) 0.4 MG SL tablet Place 1 tablet (0.4 mg total) under the tongue every 5 (five) minutes as needed for chest pain. 90 tablet 1  . pioglitazone (ACTOS) 15 MG tablet Take 1 tablet (15 mg total) by mouth daily. 90 tablet 2  . promethazine (PHENERGAN) 12.5 MG tablet Take 2 tablets (25 mg total) by mouth every 6 (six) hours as needed for nausea. 12 tablet 0  . simvastatin (ZOCOR) 20 MG tablet Take 1 tablet (20 mg total) by mouth every evening. 90 tablet 2  . TRUE METRIX BLOOD GLUCOSE TEST test strip USE TO CHECK BLOOD SUGAR 4 TIMES DAILY AND AS NEEDED  450 each 3  . budesonide-formoterol (SYMBICORT) 160-4.5 MCG/ACT inhaler Inhale 2 puffs into the lungs 2 (two) times daily. 1 Inhaler 5   Current Facility-Administered Medications on File Prior to Visit  Medication Dose Route Frequency Provider Last Rate Last Dose  . sodium chloride 0.9 % 1,000 mL with magnesium sulfate 2 g infusion   Intravenous Continuous Ladene Artist, MD   Stopped at 08/04/14 1441    BP (!) 144/58 (BP Location: Left Arm, Patient Position: Sitting, Cuff Size: Normal)   Pulse 82   Temp 97.7 F (36.5 C) (Oral)   Ht 5' 0.25" (1.53 m)   Wt 230 lb (104.3 kg)   SpO2 98%   BMI 44.55 kg/m     Review of Systems  Constitutional: Positive for activity change and unexpected weight change.  HENT: Negative for congestion, dental problem, hearing loss, rhinorrhea, sinus pressure, sore throat and tinnitus.   Eyes: Negative for pain, discharge and visual disturbance.  Respiratory: Negative for cough  and shortness of breath.   Cardiovascular: Positive for leg swelling. Negative for chest pain and palpitations.  Gastrointestinal: Negative for abdominal distention, abdominal pain, blood in stool, constipation, diarrhea, nausea and vomiting.  Genitourinary: Negative for difficulty urinating, dysuria, flank pain, frequency, hematuria, pelvic pain, urgency, vaginal bleeding, vaginal discharge and vaginal pain.  Musculoskeletal: Positive for back pain. Negative for arthralgias, gait problem and joint swelling.  Skin: Negative for rash.  Neurological: Negative for dizziness, syncope, speech difficulty, weakness, numbness  and headaches.  Hematological: Negative for adenopathy.  Psychiatric/Behavioral: Negative for agitation, behavioral problems and dysphoric mood. The patient is not nervous/anxious.        Objective:   Physical Exam  Constitutional: She is oriented to person, place, and time. She appears well-developed and well-nourished.  Weight 2:30    HENT:  Head: Normocephalic.  Right Ear: External ear normal.  Left Ear: External ear normal.  Mouth/Throat: Oropharynx is clear and moist.  Eyes: Pupils are equal, round, and reactive to light. Conjunctivae and EOM are normal.  Neck: Normal range of motion. Neck supple. No thyromegaly present.  Cardiovascular: Normal rate, regular rhythm, normal heart sounds and intact distal pulses.   Pulmonary/Chest: Effort normal and breath sounds normal.  Abdominal: Soft. Bowel sounds are normal. She exhibits no mass. There is no tenderness.  Musculoskeletal: Normal range of motion. She exhibits edema.  Ankle and peel edema, right slightly more prominent  Lymphadenopathy:    She has no cervical adenopathy.  Neurological: She is alert and oriented to person, place, and time.  Skin: Skin is warm and dry. No rash noted.  Psychiatric: She has a normal mood and affect. Her behavior is normal.          Assessment & Plan:   Diabetes mellitus.   Elevated hemoglobin A1c.  Also with nocturnal hypoglycemia. We'll decrease basal insulin by 50% to 20 units at bedtime.  We'll continue to monitor blood sugars 3 times daily prior to meals.  Will discontinue glipizide and start mealtime insulin sliding scale.  Recheck 1 month Hypertension, stable Chronic back pain Pedal edema.  Multi-factorial.  Stress.  Low-salt diet.  May need to consider discontinuation of amlodipine and/or Actos if worsens Chronic kidney disease.  Patient counseled to permanently discontinue all anti-inflammatory medications.  Present.  GFR 37.  Will need to discontinue metformin.  If any further decrease  .  Recheck 4 weeks Forms for handicap placard completed Samples of insulin provided We'll start sliding scale mealtime short acting insulin  [name]

## 2017-06-29 ENCOUNTER — Ambulatory Visit: Payer: Medicare HMO | Admitting: Adult Health

## 2017-07-06 ENCOUNTER — Telehealth: Payer: Self-pay

## 2017-07-06 NOTE — Telephone Encounter (Signed)
Received PA request for Humalog. PA submitted & pending. Key: [name]

## 2017-07-08 NOTE — Telephone Encounter (Addendum)
PA approved. Form faxed back to pharmacy. 

## 2017-07-13 ENCOUNTER — Telehealth: Payer: Self-pay | Admitting: Internal Medicine

## 2017-07-13 NOTE — Telephone Encounter (Signed)
I called the pt and informed her of the message below.  I offered an appt for her to be seen tomorrow at 3:45, Wednesday and she declined, stated she will await the appt on 8/17.

## 2017-07-13 NOTE — Telephone Encounter (Signed)
Pt is calling stating that she would like to have the whole back from her shoulders to the buttocks done instead of only the lower back.  Pt state that she need to call the office that is going to be doing the MRI back to let them know she will be having the whole back and not particle.  She state that she is hurting from her shoulders down to the buttocks and she needs to know why.

## 2017-07-13 NOTE — Telephone Encounter (Signed)
Pt is calling stating that she would like to have a MRI done (perfer afternoon) and would like to be referred this week due to back pain unable to hardly walk.

## 2017-07-13 NOTE — Telephone Encounter (Signed)
Patient requires office visit to determine appropriate diagnostic studies

## 2017-07-14 ENCOUNTER — Encounter: Payer: Self-pay | Admitting: Internal Medicine

## 2017-07-14 ENCOUNTER — Ambulatory Visit (INDEPENDENT_AMBULATORY_CARE_PROVIDER_SITE_OTHER): Payer: Medicare HMO | Admitting: Internal Medicine

## 2017-07-14 VITALS — BP 146/76 | HR 93 | Temp 97.9°F | Ht 60.0 in | Wt 234.2 lb

## 2017-07-14 DIAGNOSIS — N183 Chronic kidney disease, stage 3 unspecified: Secondary | ICD-10-CM

## 2017-07-14 DIAGNOSIS — Z794 Long term (current) use of insulin: Secondary | ICD-10-CM

## 2017-07-14 DIAGNOSIS — M545 Low back pain: Secondary | ICD-10-CM

## 2017-07-14 DIAGNOSIS — C9201 Acute myeloblastic leukemia, in remission: Secondary | ICD-10-CM | POA: Diagnosis not present

## 2017-07-14 DIAGNOSIS — G8929 Other chronic pain: Secondary | ICD-10-CM

## 2017-07-14 DIAGNOSIS — I10 Essential (primary) hypertension: Secondary | ICD-10-CM | POA: Diagnosis not present

## 2017-07-14 DIAGNOSIS — E0821 Diabetes mellitus due to underlying condition with diabetic nephropathy: Secondary | ICD-10-CM

## 2017-07-14 DIAGNOSIS — R748 Abnormal levels of other serum enzymes: Secondary | ICD-10-CM

## 2017-07-14 MED ORDER — HYDROCODONE-ACETAMINOPHEN 5-325 MG PO TABS: 1.0000 | ORAL_TABLET | Freq: Four times a day (QID) | ORAL | 0 refills | Status: DC | PRN

## 2017-07-14 NOTE — Progress Notes (Signed)
Subjective:    Patient ID: [name], female    DOB: 21-Feb-1941, 76 y.o.   MRN: [id number]  HPI  76 year old patient who is seen today complaining of persistent back pain.  This is rather diffuse and in the cervical, thoracic and lumbar area. She presented with fever and diffuse back pain in 2015 and cervical and thoracic MRIs were performed that suggested her hematologic malignancy.  For the past few months.  She has had diffuse back pain.  She does not recall that this pain is qualitatively similar.  At that time she was noted have a very elevated alkaline phosphatase which subsequently  has been normal with AML in remission.    She has diabetes.  Presently is on basal insulin 40 units and glycemic control seems to be improving.  She no longer is on mealtime insulin due to noncoverage  Past Medical History:  Diagnosis Date  . acute myeloblastic leukemia (AML) dx'd 06/2014  . Acute pancreatitis   . Blood transfusion without reported diagnosis 2015   Leukemia  . Chronic kidney disease, stage II (mild)   . Colon polyps 04/29/2010   TUBULAR ADENOMA AND A SERRATED ADENOMA  . CORONARY ARTERY DISEASE 12/24/2007  . DIABETES MELLITUS, TYPE II 07/13/2007  . HYPERLIPIDEMIA 12/24/2007  . HYPERTENSION 07/13/2007  . NEPHROLITHIASIS, HX OF 12/24/2007  . Unspecified disease of pancreas      Social History   Social History  . Marital status: Married    Spouse name: N/A  . Number of children: N/A  . Years of education: N/A   Occupational History  . works in a lab     makes glasses   Social History Main Topics  . Smoking status: Former Smoker    Packs/day: 1.00    Years: 20.00    Types: Cigarettes    Quit date: 12/08/2000  . Smokeless tobacco: Never Used  . Alcohol use No  . Drug use: No  . Sexual activity: Not on file   Other Topics Concern  . 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 ABDOMINAL HYSTERECTOMY  unknown  . CARDIAC CATHETERIZATION  2002  . CATARACT EXTRACTION Bilateral   . COLONOSCOPY  2011  . EXCISIONAL HEMORRHOIDECTOMY  unknown  . NASAL SINUS SURGERY  unknown  . POLYPECTOMY  2011    Family History  Problem Relation Age of Onset  . Cancer Father        throat ca  . COPD Father   . Cancer Sister        uterine ca and breast ca  . Cancer Brother        throat  . Colon cancer Neg Hx     No Known Allergies  Current Outpatient Prescriptions on File Prior to Visit  Medication Sig Dispense Refill  . amLODipine (NORVASC) 5 MG tablet Take 1 tablet (5 mg total) by mouth daily. 90 tablet 1  . aspirin EC 325 MG tablet Take 325 mg by mouth daily.    . Blood Glucose Monitoring Suppl (TRUE METRIX AIR GLUCOSE METER) DEVI 1 applicator by Does not apply route 3 (three) times daily between meals as needed. 1 Device 0  . Blood Glucose Monitoring Suppl (TRUE METRIX AIR GLUCOSE METER) w/Device KIT USE AS DIRECTED 1 kit 0  . furosemide (LASIX) 40 MG tablet Take 1 tablet (40 mg total) by mouth  daily. 90 tablet 1  . Insulin Glargine (TOUJEO SOLOSTAR) 300 UNIT/ML SOPN Inject 20 Units into the skin at bedtime. 1 pen 6  . insulin lispro (HUMALOG) 100 UNIT/ML KiwkPen Blood sugar less than 70- no insulin Blood sugar 71-150- 4 units Blood sugar 151- 200- 6 units Blood sugar over 200-  8 units 15 mL 11  . Insulin Pen Needle (BD ULTRA-FINE MICRO PEN NEEDLE) 32G X 6 MM MISC Use to check Blood sugar TID and prn. 100 each 11  . lisinopril (PRINIVIL,ZESTRIL) 10 MG tablet Take 1 tablet (10 mg total) by mouth daily. 90 tablet 3  . LORazepam (ATIVAN) 0.5 MG tablet Take 1 tablet (0.5 mg total) by mouth every 6 (six) hours as needed for anxiety. 90 tablet 5  . magnesium oxide (MAG-OX) 400 (241.3 Mg) MG tablet Take 2 tablets (800 mg total) by mouth 2 (two) times daily. 360 tablet 3  . metFORMIN (GLUCOPHAGE) 500 MG tablet Take 1 tablet (500 mg total) by mouth 2 (two) times daily  with a meal. 180 tablet 3  . metoprolol tartrate (LOPRESSOR) 50 MG tablet TAKE 1 TABLET (50 MG TOTAL) BY MOUTH 2 (TWO) TIMES DAILY. 180 tablet 1  . nitroGLYCERIN (NITROSTAT) 0.4 MG SL tablet Place 1 tablet (0.4 mg total) under the tongue every 5 (five) minutes as needed for chest pain. 90 tablet 1  . pioglitazone (ACTOS) 15 MG tablet Take 1 tablet (15 mg total) by mouth daily. 90 tablet 2  . promethazine (PHENERGAN) 12.5 MG tablet Take 2 tablets (25 mg total) by mouth every 6 (six) hours as needed for nausea. 12 tablet 0  . simvastatin (ZOCOR) 20 MG tablet Take 1 tablet (20 mg total) by mouth every evening. 90 tablet 2  . TRUE METRIX BLOOD GLUCOSE TEST test strip USE TO CHECK BLOOD SUGAR 4 TIMES DAILY AND AS NEEDED  450 each 3  . budesonide-formoterol (SYMBICORT) 160-4.5 MCG/ACT inhaler Inhale 2 puffs into the lungs 2 (two) times daily. 1 Inhaler 5   Current Facility-Administered Medications on File Prior to Visit  Medication Dose Route Frequency Provider Last Rate Last Dose  . sodium chloride 0.9 % 1,000 mL with magnesium sulfate 2 g infusion   Intravenous Continuous Ladell Pier, MD   Stopped at 08/04/14 1441    BP (!) 146/76 (BP Location: Left Arm, Patient Position: Sitting, Cuff Size: Normal)   Pulse 93   Temp 97.9 F (36.6 C) (Oral)   Ht 5' (1.524 m)   Wt 234 lb 3.2 oz (106.2 kg)   SpO2 98%   BMI 45.74 kg/m     Review of Systems  Constitutional: Negative.  Negative for fever.  HENT: Negative for congestion, dental problem, hearing loss, rhinorrhea, sinus pressure, sore throat and tinnitus.   Eyes: Negative for pain, discharge and visual disturbance.  Respiratory: Negative for cough and shortness of breath.   Cardiovascular: Positive for leg swelling. Negative for chest pain and palpitations.  Gastrointestinal: Negative for abdominal distention, abdominal pain, blood in stool, constipation, diarrhea, nausea and vomiting.  Genitourinary: Negative for difficulty urinating,  dysuria, flank pain, frequency, hematuria, pelvic pain, urgency, vaginal bleeding, vaginal discharge and vaginal pain.  Musculoskeletal: Positive for back pain. Negative for arthralgias, gait problem and joint swelling.  Skin: Negative for rash.  Neurological: Negative for dizziness, syncope, speech difficulty, weakness, numbness and headaches.  Hematological: Negative for adenopathy.  Psychiatric/Behavioral: Negative for agitation, behavioral problems and dysphoric mood. The patient is not nervous/anxious.  Objective:   Physical Exam  Constitutional: She appears well-developed and well-nourished. No distress.  Musculoskeletal: She exhibits edema.  Edema right greater than left          Assessment & Plan:   Diffuse back pain.  We'll check alkaline phosphatase and CBC.  Continue analgesics. Suspect this may signal recurrence of AML Diabetes mellitus.  Seems to be improving.  Continue present regimen  Hematologic/oncology follow-up  Return here as scheduled Analgesics refilled  [name]

## 2017-07-14 NOTE — Patient Instructions (Signed)
Hematology oncology follow-up as scheduled  Pain medications as directed

## 2017-07-15 LAB — CBC WITH DIFFERENTIAL/PLATELET
BASOS ABS: 0 10*3/uL (ref 0.0–0.1)
Basophils Relative: 0.7 % (ref 0.0–3.0)
EOS ABS: 0.1 10*3/uL (ref 0.0–0.7)
Eosinophils Relative: 1.6 % (ref 0.0–5.0)
HEMATOCRIT: 33.4 % — AB (ref 36.0–46.0)
Hemoglobin: 10.8 g/dL — ABNORMAL LOW (ref 12.0–15.0)
LYMPHS PCT: 24.2 % (ref 12.0–46.0)
Lymphs Abs: 1.3 10*3/uL (ref 0.7–4.0)
MCHC: 32.4 g/dL (ref 30.0–36.0)
MCV: 97.8 fl (ref 78.0–100.0)
Monocytes Absolute: 0.5 10*3/uL (ref 0.1–1.0)
Monocytes Relative: 9.8 % (ref 3.0–12.0)
NEUTROS ABS: 3.5 10*3/uL (ref 1.4–7.7)
Neutrophils Relative %: 63.7 % (ref 43.0–77.0)
Platelets: 131 10*3/uL — ABNORMAL LOW (ref 150.0–400.0)
RBC: 3.41 Mil/uL — ABNORMAL LOW (ref 3.87–5.11)
RDW: 15.4 % (ref 11.5–15.5)
WBC: 5.5 10*3/uL (ref 4.0–10.5)

## 2017-07-15 LAB — COMPREHENSIVE METABOLIC PANEL
ALBUMIN: 3.7 g/dL (ref 3.5–5.2)
ALT: 14 U/L (ref 0–35)
AST: 14 U/L (ref 0–37)
Alkaline Phosphatase: 80 U/L (ref 39–117)
BILIRUBIN TOTAL: 0.4 mg/dL (ref 0.2–1.2)
BUN: 27 mg/dL — ABNORMAL HIGH (ref 6–23)
CALCIUM: 9.2 mg/dL (ref 8.4–10.5)
CO2: 30 meq/L (ref 19–32)
CREATININE: 1.69 mg/dL — AB (ref 0.40–1.20)
Chloride: 103 mEq/L (ref 96–112)
GFR: 31.31 mL/min — ABNORMAL LOW (ref >60.00)
Glucose, Bld: 381 mg/dL — ABNORMAL HIGH (ref 70–99)
Potassium: 5.8 mEq/L — ABNORMAL HIGH (ref 3.5–5.1)
Sodium: 138 mEq/L (ref 135–145)
TOTAL PROTEIN: 5.9 g/dL — AB (ref 6.0–8.3)

## 2017-07-15 LAB — HEMOGLOBIN A1C: HEMOGLOBIN A1C: 10.3 % — AB (ref 4.6–6.5)

## 2017-07-16 ENCOUNTER — Telehealth: Payer: Self-pay | Admitting: Internal Medicine

## 2017-07-16 NOTE — Telephone Encounter (Signed)
Pt would like results of labs done yesterday.  Advised pt waiting on Dr Raliegh Ip to review. Please call back.

## 2017-07-16 NOTE — Telephone Encounter (Signed)
Called and informed pt that we have some insulin samples for her.

## 2017-07-17 NOTE — Telephone Encounter (Signed)
   Medication Samples have been provided to the patient.  Drug name: Humalog U-200      Strength: 200 units/ mL      Qty: 1 pen LOT: [id number]  Exp.Date:08/08/19  Dosing instructions:  HUMALOG  Sig: Blood sugar less than 70- no insulin  Blood sugar 71-150- 4 units  Blood sugar 151- 200- 6 units  Blood sugar over 200- 8 units The patient has been instructed regarding the correct time, dose, and frequency of taking this medication, including desired effects and most common side effects.     Drug name: Humalog U-100       Strength: 100 units/mL       Qty: 1 pen  LOT: [id number]  Exp.Date: 21/31/19  Dosing instructions: HUMALOG  Sig: Blood sugar less than 70- no insulin  Blood sugar 71-150- 4 units  Blood sugar 151- 200- 6 units  Blood sugar over 200- 8 units    Abelardo Diesel 4:47 PM 07/17/2017 Abelardo Diesel 4:44 PM 07/17/2017

## 2017-07-17 NOTE — Telephone Encounter (Signed)
Please advise 

## 2017-07-17 NOTE — Telephone Encounter (Signed)
Notify patient the labs were okay and did not suggest relapse of leukemia.  Continue analgesics.  Have patient discuss with oncology possible Imaging studies

## 2017-07-17 NOTE — Telephone Encounter (Signed)
Left message on voicemail to call office.  

## 2017-07-24 ENCOUNTER — Ambulatory Visit (INDEPENDENT_AMBULATORY_CARE_PROVIDER_SITE_OTHER): Payer: Medicare HMO | Admitting: Internal Medicine

## 2017-07-24 ENCOUNTER — Encounter: Payer: Self-pay | Admitting: Internal Medicine

## 2017-07-24 VITALS — BP 100/56 | HR 86 | Temp 98.4°F | Wt 232.0 lb

## 2017-07-24 DIAGNOSIS — I10 Essential (primary) hypertension: Secondary | ICD-10-CM | POA: Diagnosis not present

## 2017-07-24 DIAGNOSIS — E538 Deficiency of other specified B group vitamins: Secondary | ICD-10-CM

## 2017-07-24 DIAGNOSIS — E0821 Diabetes mellitus due to underlying condition with diabetic nephropathy: Secondary | ICD-10-CM | POA: Diagnosis not present

## 2017-07-24 DIAGNOSIS — I4892 Unspecified atrial flutter: Secondary | ICD-10-CM

## 2017-07-24 DIAGNOSIS — J449 Chronic obstructive pulmonary disease, unspecified: Secondary | ICD-10-CM | POA: Diagnosis not present

## 2017-07-24 DIAGNOSIS — M549 Dorsalgia, unspecified: Secondary | ICD-10-CM | POA: Diagnosis not present

## 2017-07-24 DIAGNOSIS — Z7984 Long term (current) use of oral hypoglycemic drugs: Secondary | ICD-10-CM | POA: Diagnosis not present

## 2017-07-24 DIAGNOSIS — N189 Chronic kidney disease, unspecified: Secondary | ICD-10-CM | POA: Diagnosis not present

## 2017-07-24 DIAGNOSIS — I129 Hypertensive chronic kidney disease with stage 1 through stage 4 chronic kidney disease, or unspecified chronic kidney disease: Secondary | ICD-10-CM | POA: Diagnosis not present

## 2017-07-24 DIAGNOSIS — G8929 Other chronic pain: Secondary | ICD-10-CM | POA: Diagnosis not present

## 2017-07-24 DIAGNOSIS — Z9221 Personal history of antineoplastic chemotherapy: Secondary | ICD-10-CM | POA: Diagnosis not present

## 2017-07-24 DIAGNOSIS — C9201 Acute myeloblastic leukemia, in remission: Secondary | ICD-10-CM | POA: Diagnosis not present

## 2017-07-24 DIAGNOSIS — I480 Paroxysmal atrial fibrillation: Secondary | ICD-10-CM | POA: Diagnosis not present

## 2017-07-24 DIAGNOSIS — Z794 Long term (current) use of insulin: Secondary | ICD-10-CM

## 2017-07-24 DIAGNOSIS — Z7982 Long term (current) use of aspirin: Secondary | ICD-10-CM | POA: Diagnosis not present

## 2017-07-24 DIAGNOSIS — E119 Type 2 diabetes mellitus without complications: Secondary | ICD-10-CM | POA: Diagnosis not present

## 2017-07-24 MED ORDER — CYANOCOBALAMIN 1000 MCG/ML IJ SOLN
1000.0000 ug | Freq: Once | INTRAMUSCULAR | Status: AC
  Administered 2017-07-24: 1000 ug via INTRAMUSCULAR

## 2017-07-24 NOTE — Progress Notes (Signed)
Subjective:    Patient ID: [name], female    DOB: May 16, 1941, 76 y.o.   MRN: [id number]  HPI 76 year old patient who is seen today in follow-up.  She was seen 8 days ago complaining of fairly diffuse back pain involving both the cervical, lumbar and thoracic areas. Today she complains of mainly severe lumbar pain associated with leg pain.  This has greatly interfere with her activities and ability to walk.  She describes some tightness in the shoulder area, but again pain seems more limited to the lumbar region.  She did have cervical and thoracic MRI studies performed in 2015 She has had a recent hematologic evaluation earlier today for AML which has been in remission The patient states that she is quite miserable and hopes there is a surgical solution to her pain.  She remains symptomatic in spite of analgesics  She has poorly controlled diabetes and self discontinued mealtime insulin due to noncoverage.  She has resumed short acting insulin (.  Samples were provided) and has noted much improved glycemic control.  No hypoglycemia  Blood studies obtained earlier today by hematology revealed a random blood sugar of 145  Past Medical History:  Diagnosis Date  . acute myeloblastic leukemia (AML) dx'd 06/2014  . Acute pancreatitis   . Blood transfusion without reported diagnosis 2015   Leukemia  . Chronic kidney disease, stage II (mild)   . Colon polyps 04/29/2010   TUBULAR ADENOMA AND A SERRATED ADENOMA  . CORONARY ARTERY DISEASE 12/24/2007  . DIABETES MELLITUS, TYPE II 07/13/2007  . HYPERLIPIDEMIA 12/24/2007  . HYPERTENSION 07/13/2007  . NEPHROLITHIASIS, HX OF 12/24/2007  . Unspecified disease of pancreas      Social History   Social History  . Marital status: Married    Spouse name: N/A  . Number of children: N/A  . Years of education: N/A   Occupational History  . works in a lab     makes glasses   Social History Main Topics  . Smoking status: Former Smoker    Packs/day:  1.00    Years: 20.00    Types: Cigarettes    Quit date: 12/08/2000  . Smokeless tobacco: Never Used  . Alcohol use No  . Drug use: No  . Sexual activity: Not on file   Other Topics Concern  . Not on file   Social History Narrative   Still works full-time assembling eye glasses '[employer]'$     Past Surgical History:  Procedure Laterality Date  . ABDOMINAL HYSTERECTOMY  unknown  . CARDIAC CATHETERIZATION  2002  . CATARACT EXTRACTION Bilateral   . COLONOSCOPY  2011  . EXCISIONAL HEMORRHOIDECTOMY  unknown  . NASAL SINUS SURGERY  unknown  . POLYPECTOMY  2011    Family History  Problem Relation Age of Onset  . Cancer Father        throat ca  . COPD Father   . Cancer Sister        uterine ca and breast ca  . Cancer Brother        throat  . Colon cancer Neg Hx     No Known Allergies  Current Outpatient Prescriptions on File Prior to Visit  Medication Sig Dispense Refill  . amLODipine (NORVASC) 5 MG tablet Take 1 tablet (5 mg total) by mouth daily. 90 tablet 1  . aspirin EC 325 MG tablet Take 325 mg by mouth daily.    . Blood Glucose Monitoring Suppl (TRUE METRIX AIR GLUCOSE METER) DEVI 1  applicator by Does not apply route 3 (three) times daily between meals as needed. 1 Device 0  . Blood Glucose Monitoring Suppl (TRUE METRIX AIR GLUCOSE METER) w/Device KIT USE AS DIRECTED 1 kit 0  . furosemide (LASIX) 40 MG tablet Take 1 tablet (40 mg total) by mouth daily. 90 tablet 1  . HYDROcodone-acetaminophen (NORCO/VICODIN) 5-325 MG tablet Take 1 tablet by mouth every 6 (six) hours as needed for moderate pain. 120 tablet 0  . Insulin Glargine (TOUJEO SOLOSTAR) 300 UNIT/ML SOPN Inject 20 Units into the skin at bedtime. 1 pen 6  . insulin lispro (HUMALOG) 100 UNIT/ML KiwkPen Blood sugar less than 70- no insulin Blood sugar 71-150- 4 units Blood sugar 151- 200- 6 units Blood sugar over 200-  8 units 15 mL 11  . Insulin Pen Needle (BD ULTRA-FINE MICRO PEN NEEDLE) 32G X 6 MM MISC Use to check  Blood sugar TID and prn. 100 each 11  . lisinopril (PRINIVIL,ZESTRIL) 10 MG tablet Take 1 tablet (10 mg total) by mouth daily. 90 tablet 3  . LORazepam (ATIVAN) 0.5 MG tablet Take 1 tablet (0.5 mg total) by mouth every 6 (six) hours as needed for anxiety. 90 tablet 5  . magnesium oxide (MAG-OX) 400 (241.3 Mg) MG tablet Take 2 tablets (800 mg total) by mouth 2 (two) times daily. 360 tablet 3  . metFORMIN (GLUCOPHAGE) 500 MG tablet Take 1 tablet (500 mg total) by mouth 2 (two) times daily with a meal. 180 tablet 3  . metoprolol tartrate (LOPRESSOR) 50 MG tablet TAKE 1 TABLET (50 MG TOTAL) BY MOUTH 2 (TWO) TIMES DAILY. 180 tablet 1  . nitroGLYCERIN (NITROSTAT) 0.4 MG SL tablet Place 1 tablet (0.4 mg total) under the tongue every 5 (five) minutes as needed for chest pain. 90 tablet 1  . pioglitazone (ACTOS) 15 MG tablet Take 1 tablet (15 mg total) by mouth daily. 90 tablet 2  . promethazine (PHENERGAN) 12.5 MG tablet Take 2 tablets (25 mg total) by mouth every 6 (six) hours as needed for nausea. 12 tablet 0  . simvastatin (ZOCOR) 20 MG tablet Take 1 tablet (20 mg total) by mouth every evening. 90 tablet 2  . TRUE METRIX BLOOD GLUCOSE TEST test strip USE TO CHECK BLOOD SUGAR 4 TIMES DAILY AND AS NEEDED  450 each 3  . budesonide-formoterol (SYMBICORT) 160-4.5 MCG/ACT inhaler Inhale 2 puffs into the lungs 2 (two) times daily. 1 Inhaler 5   Current Facility-Administered Medications on File Prior to Visit  Medication Dose Route Frequency Provider Last Rate Last Dose  . sodium chloride 0.9 % 1,000 mL with magnesium sulfate 2 g infusion   Intravenous Continuous Ladell Pier, MD   Stopped at 08/04/14 1441    BP (!) 100/56 (BP Location: Left Arm, Patient Position: Sitting, Cuff Size: Large)   Pulse 86   Temp 98.4 F (36.9 C) (Oral)   Wt 232 lb (105.2 kg)   SpO2 97%   BMI 45.31 kg/m      Review of Systems  Constitutional: Negative.   HENT: Negative for congestion, dental problem, hearing loss,  rhinorrhea, sinus pressure, sore throat and tinnitus.   Eyes: Negative for pain, discharge and visual disturbance.  Respiratory: Negative for cough and shortness of breath.   Cardiovascular: Negative for chest pain, palpitations and leg swelling.  Gastrointestinal: Negative for abdominal distention, abdominal pain, blood in stool, constipation, diarrhea, nausea and vomiting.  Genitourinary: Negative for difficulty urinating, dysuria, flank pain, frequency, hematuria, pelvic pain, urgency, vaginal  bleeding, vaginal discharge and vaginal pain.  Musculoskeletal: Positive for back pain and neck stiffness. Negative for arthralgias, gait problem and joint swelling.  Skin: Negative for rash.  Neurological: Negative for dizziness, syncope, speech difficulty, weakness, numbness and headaches.  Hematological: Negative for adenopathy.  Psychiatric/Behavioral: Negative for agitation, behavioral problems and dysphoric mood. The patient is not nervous/anxious.        Objective:   Physical Exam  Constitutional: She is oriented to person, place, and time. She appears well-developed and well-nourished.  Weight 232 Blood pressure 110/60  HENT:  Head: Normocephalic.  Right Ear: External ear normal.  Left Ear: External ear normal.  Mouth/Throat: Oropharynx is clear and moist.  Eyes: Pupils are equal, round, and reactive to light. Conjunctivae and EOM are normal.  Neck: Normal range of motion. Neck supple. No thyromegaly present.  Cardiovascular: Normal rate, regular rhythm, normal heart sounds and intact distal pulses.   Pulmonary/Chest: Effort normal and breath sounds normal.  Abdominal: Soft. Bowel sounds are normal. She exhibits no mass. There is no tenderness.  Musculoskeletal: Normal range of motion.  Lymphadenopathy:    She has no cervical adenopathy.  Neurological: She is alert and oriented to person, place, and time.  Skin: Skin is warm and dry. No rash noted.  Psychiatric: She has a normal  mood and affect. Her behavior is normal.          Assessment & Plan:   Chronic low back pain with associated leg pain, rule out spinal stenosis.  Lumbar MRI ordered.  Continue analgesics Diabetes mellitus, improved.  Will continue present regimen which includes basal and bolus insulin.  Insulin.  Samples provided AML remission B12 deficiency.  Vitamin B12 injection dispensed  Follow-up 3 months  [id number],PETER Pilar Plate

## 2017-07-24 NOTE — Patient Instructions (Signed)
Please check your hemoglobin A1c every 3 months  Lumbar MRI as discussed  Limit your sodium (Salt) intake

## 2017-07-24 NOTE — Progress Notes (Signed)
Medication Samples have been provided to the patient.  Drug name: Toujeo Solostar       Strength:300 units        Qty: 2   LOT: [id number] Exp.Date: 06/07/2019  Dosing instructions: Inject 20 units at bedtime  The patient has been instructed regarding the correct time, dose, and frequency of taking this medication, including desired effects and most common side effects.   [name] 4:22 PM 07/24/2017

## 2017-07-24 NOTE — Addendum Note (Signed)
Addended by: Wyvonne Lenz on: 07/24/2017 04:39 PM   Modules accepted: Orders

## 2017-08-08 ENCOUNTER — Ambulatory Visit
Admission: RE | Admit: 2017-08-08 | Discharge: 2017-08-08 | Disposition: A | Discharge status: Home / Self Care | Payer: Medicare HMO | Source: Ambulatory Visit | Attending: Internal Medicine | Admitting: Internal Medicine

## 2017-08-08 DIAGNOSIS — M549 Dorsalgia, unspecified: Principal | ICD-10-CM

## 2017-08-08 DIAGNOSIS — M5126 Other intervertebral disc displacement, lumbar region: Secondary | ICD-10-CM | POA: Diagnosis not present

## 2017-08-08 DIAGNOSIS — G8929 Other chronic pain: Secondary | ICD-10-CM

## 2017-08-24 ENCOUNTER — Encounter (HOSPITAL_COMMUNITY): Payer: Self-pay | Admitting: Emergency Medicine

## 2017-08-24 ENCOUNTER — Ambulatory Visit (HOSPITAL_BASED_OUTPATIENT_CLINIC_OR_DEPARTMENT_OTHER): Payer: Medicare HMO

## 2017-08-24 ENCOUNTER — Inpatient Hospital Stay (HOSPITAL_COMMUNITY)
Admission: EM | Admit: 2017-08-24 | Discharge: 2017-08-28 | DRG: 280 | Disposition: A | Discharge status: Home / Self Care | Payer: Medicare HMO | Attending: Internal Medicine | Admitting: Internal Medicine

## 2017-08-24 ENCOUNTER — Emergency Department (HOSPITAL_COMMUNITY): Payer: Medicare HMO

## 2017-08-24 DIAGNOSIS — R0689 Other abnormalities of breathing: Secondary | ICD-10-CM

## 2017-08-24 DIAGNOSIS — I5042 Chronic combined systolic (congestive) and diastolic (congestive) heart failure: Secondary | ICD-10-CM

## 2017-08-24 DIAGNOSIS — E1165 Type 2 diabetes mellitus with hyperglycemia: Secondary | ICD-10-CM | POA: Diagnosis not present

## 2017-08-24 DIAGNOSIS — I5043 Acute on chronic combined systolic (congestive) and diastolic (congestive) heart failure: Secondary | ICD-10-CM

## 2017-08-24 DIAGNOSIS — C9200 Acute myeloblastic leukemia, not having achieved remission: Secondary | ICD-10-CM | POA: Diagnosis present

## 2017-08-24 DIAGNOSIS — M549 Dorsalgia, unspecified: Secondary | ICD-10-CM | POA: Diagnosis not present

## 2017-08-24 DIAGNOSIS — I4891 Unspecified atrial fibrillation: Secondary | ICD-10-CM | POA: Diagnosis present

## 2017-08-24 DIAGNOSIS — I5023 Acute on chronic systolic (congestive) heart failure: Secondary | ICD-10-CM | POA: Diagnosis not present

## 2017-08-24 DIAGNOSIS — R0682 Tachypnea, not elsewhere classified: Secondary | ICD-10-CM | POA: Diagnosis not present

## 2017-08-24 DIAGNOSIS — Z7951 Long term (current) use of inhaled steroids: Secondary | ICD-10-CM | POA: Diagnosis not present

## 2017-08-24 DIAGNOSIS — N184 Chronic kidney disease, stage 4 (severe): Secondary | ICD-10-CM | POA: Diagnosis not present

## 2017-08-24 DIAGNOSIS — Z8249 Family history of ischemic heart disease and other diseases of the circulatory system: Secondary | ICD-10-CM

## 2017-08-24 DIAGNOSIS — I2510 Atherosclerotic heart disease of native coronary artery without angina pectoris: Secondary | ICD-10-CM | POA: Diagnosis present

## 2017-08-24 DIAGNOSIS — G8929 Other chronic pain: Secondary | ICD-10-CM | POA: Diagnosis present

## 2017-08-24 DIAGNOSIS — I130 Hypertensive heart and chronic kidney disease with heart failure and stage 1 through stage 4 chronic kidney disease, or unspecified chronic kidney disease: Secondary | ICD-10-CM | POA: Diagnosis not present

## 2017-08-24 DIAGNOSIS — E0821 Diabetes mellitus due to underlying condition with diabetic nephropathy: Secondary | ICD-10-CM | POA: Diagnosis not present

## 2017-08-24 DIAGNOSIS — I10 Essential (primary) hypertension: Secondary | ICD-10-CM | POA: Diagnosis not present

## 2017-08-24 DIAGNOSIS — R0601 Orthopnea: Secondary | ICD-10-CM | POA: Diagnosis present

## 2017-08-24 DIAGNOSIS — R0602 Shortness of breath: Secondary | ICD-10-CM | POA: Diagnosis not present

## 2017-08-24 DIAGNOSIS — I272 Pulmonary hypertension, unspecified: Secondary | ICD-10-CM | POA: Diagnosis present

## 2017-08-24 DIAGNOSIS — Z79899 Other long term (current) drug therapy: Secondary | ICD-10-CM | POA: Diagnosis not present

## 2017-08-24 DIAGNOSIS — Z6841 Body Mass Index (BMI) 40.0 and over, adult: Secondary | ICD-10-CM | POA: Diagnosis not present

## 2017-08-24 DIAGNOSIS — Z87891 Personal history of nicotine dependence: Secondary | ICD-10-CM

## 2017-08-24 DIAGNOSIS — E1329 Other specified diabetes mellitus with other diabetic kidney complication: Secondary | ICD-10-CM | POA: Diagnosis not present

## 2017-08-24 DIAGNOSIS — C9201 Acute myeloblastic leukemia, in remission: Secondary | ICD-10-CM | POA: Diagnosis not present

## 2017-08-24 DIAGNOSIS — D649 Anemia, unspecified: Secondary | ICD-10-CM | POA: Diagnosis present

## 2017-08-24 DIAGNOSIS — Z9221 Personal history of antineoplastic chemotherapy: Secondary | ICD-10-CM | POA: Diagnosis not present

## 2017-08-24 DIAGNOSIS — Z7982 Long term (current) use of aspirin: Secondary | ICD-10-CM | POA: Diagnosis not present

## 2017-08-24 DIAGNOSIS — D61818 Other pancytopenia: Secondary | ICD-10-CM

## 2017-08-24 DIAGNOSIS — Z23 Encounter for immunization: Secondary | ICD-10-CM | POA: Diagnosis not present

## 2017-08-24 DIAGNOSIS — E785 Hyperlipidemia, unspecified: Secondary | ICD-10-CM | POA: Diagnosis present

## 2017-08-24 DIAGNOSIS — J449 Chronic obstructive pulmonary disease, unspecified: Secondary | ICD-10-CM | POA: Diagnosis present

## 2017-08-24 DIAGNOSIS — I214 Non-ST elevation (NSTEMI) myocardial infarction: Secondary | ICD-10-CM | POA: Diagnosis present

## 2017-08-24 DIAGNOSIS — I25110 Atherosclerotic heart disease of native coronary artery with unstable angina pectoris: Secondary | ICD-10-CM | POA: Diagnosis not present

## 2017-08-24 DIAGNOSIS — Z794 Long term (current) use of insulin: Secondary | ICD-10-CM | POA: Diagnosis not present

## 2017-08-24 DIAGNOSIS — I5041 Acute combined systolic (congestive) and diastolic (congestive) heart failure: Secondary | ICD-10-CM | POA: Diagnosis not present

## 2017-08-24 DIAGNOSIS — E875 Hyperkalemia: Secondary | ICD-10-CM | POA: Diagnosis not present

## 2017-08-24 DIAGNOSIS — E119 Type 2 diabetes mellitus without complications: Secondary | ICD-10-CM

## 2017-08-24 DIAGNOSIS — N289 Disorder of kidney and ureter, unspecified: Secondary | ICD-10-CM

## 2017-08-24 DIAGNOSIS — D696 Thrombocytopenia, unspecified: Secondary | ICD-10-CM | POA: Diagnosis present

## 2017-08-24 DIAGNOSIS — I509 Heart failure, unspecified: Secondary | ICD-10-CM

## 2017-08-24 DIAGNOSIS — R0600 Dyspnea, unspecified: Secondary | ICD-10-CM | POA: Diagnosis not present

## 2017-08-24 DIAGNOSIS — I252 Old myocardial infarction: Secondary | ICD-10-CM

## 2017-08-24 DIAGNOSIS — I5021 Acute systolic (congestive) heart failure: Secondary | ICD-10-CM | POA: Diagnosis not present

## 2017-08-24 DIAGNOSIS — E1122 Type 2 diabetes mellitus with diabetic chronic kidney disease: Secondary | ICD-10-CM | POA: Diagnosis present

## 2017-08-24 DIAGNOSIS — I5033 Acute on chronic diastolic (congestive) heart failure: Secondary | ICD-10-CM

## 2017-08-24 DIAGNOSIS — IMO0001 Reserved for inherently not codable concepts without codable children: Secondary | ICD-10-CM | POA: Diagnosis present

## 2017-08-24 DIAGNOSIS — N183 Chronic kidney disease, stage 3 (moderate): Secondary | ICD-10-CM | POA: Diagnosis not present

## 2017-08-24 DIAGNOSIS — R000 Tachycardia, unspecified: Secondary | ICD-10-CM | POA: Diagnosis not present

## 2017-08-24 DIAGNOSIS — N058 Unspecified nephritic syndrome with other morphologic changes: Secondary | ICD-10-CM | POA: Diagnosis not present

## 2017-08-24 HISTORY — DX: Unspecified atrial flutter: I48.92

## 2017-08-24 HISTORY — DX: Chronic obstructive pulmonary disease, unspecified: J44.9

## 2017-08-24 LAB — ECHOCARDIOGRAM COMPLETE
CHL CUP RV SYS PRESS: 51 mmHg
CHL CUP TV REG PEAK VELOCITY: 300 cm/s
FS: 33 % (ref 28–44)
IV/PV OW: 1
LA diam end sys: 40 mm
LA diam index: 1.84 cm/m2
LA vol A4C: 52.1 ml
LA vol index: 21.4 mL/m2
LA vol: 46.5 mL
LASIZE: 40 mm
LVOT SV: 64 mL
LVOT VTI: 28 cm
LVOT area: 2.27 cm2
LVOT diameter: 17 mm
LVOT peak grad rest: 7 mmHg
LVOT peak vel: 128 cm/s
MVPG: 4 mmHg
MVPKAVEL: 103 m/s
MVPKEVEL: 100 m/s
PW: 14 mm — AB (ref 0.6–1.1)
TAPSE: 16 mm
TRMAXVEL: 300 cm/s

## 2017-08-24 LAB — DIFFERENTIAL
Basophils Absolute: 0 10*3/uL (ref 0.0–0.1)
Basophils Relative: 0 %
EOS ABS: 0.1 10*3/uL (ref 0.0–0.7)
EOS PCT: 1 %
LYMPHS PCT: 13 %
Lymphs Abs: 0.9 10*3/uL (ref 0.7–4.0)
MONO ABS: 0.8 10*3/uL (ref 0.1–1.0)
Monocytes Relative: 11 %
NEUTROS PCT: 75 %
Neutro Abs: 5 10*3/uL (ref 1.7–7.7)

## 2017-08-24 LAB — URINALYSIS, ROUTINE W REFLEX MICROSCOPIC
BACTERIA UA: NONE SEEN
Bilirubin Urine: NEGATIVE
Hgb urine dipstick: NEGATIVE
Ketones, ur: NEGATIVE mg/dL
LEUKOCYTES UA: NEGATIVE
NITRITE: NEGATIVE
PROTEIN: NEGATIVE mg/dL
SQUAMOUS EPITHELIAL / LPF: NONE SEEN
Specific Gravity, Urine: 1.009 (ref 1.005–1.030)
pH: 5 (ref 5.0–8.0)

## 2017-08-24 LAB — CBC
HCT: 28.2 % — ABNORMAL LOW (ref 36.0–46.0)
Hemoglobin: 9.1 g/dL — ABNORMAL LOW (ref 12.0–15.0)
MCH: 31.3 pg (ref 26.0–34.0)
MCHC: 32.3 g/dL (ref 30.0–36.0)
MCV: 96.9 fL (ref 78.0–100.0)
PLATELETS: 101 10*3/uL — AB (ref 150–400)
RBC: 2.91 MIL/uL — ABNORMAL LOW (ref 3.87–5.11)
RDW: 14.4 % (ref 11.5–15.5)
WBC: 7 10*3/uL (ref 4.0–10.5)

## 2017-08-24 LAB — BLOOD CULTURE ID PANEL (REFLEXED)
Acinetobacter baumannii: NOT DETECTED
CANDIDA ALBICANS: NOT DETECTED
CANDIDA PARAPSILOSIS: NOT DETECTED
CANDIDA TROPICALIS: NOT DETECTED
Candida glabrata: NOT DETECTED
Candida krusei: NOT DETECTED
Carbapenem resistance: NOT DETECTED
ENTEROBACTER CLOACAE COMPLEX: NOT DETECTED
ENTEROCOCCUS SPECIES: NOT DETECTED
Enterobacteriaceae species: NOT DETECTED
Escherichia coli: NOT DETECTED
Haemophilus influenzae: NOT DETECTED
KLEBSIELLA PNEUMONIAE: NOT DETECTED
Klebsiella oxytoca: NOT DETECTED
LISTERIA MONOCYTOGENES: NOT DETECTED
Methicillin resistance: NOT DETECTED
Neisseria meningitidis: NOT DETECTED
PROTEUS SPECIES: NOT DETECTED
Pseudomonas aeruginosa: NOT DETECTED
SERRATIA MARCESCENS: NOT DETECTED
STAPHYLOCOCCUS AUREUS BCID: NOT DETECTED
STREPTOCOCCUS PNEUMONIAE: NOT DETECTED
Staphylococcus species: NOT DETECTED
Streptococcus agalactiae: NOT DETECTED
Streptococcus pyogenes: NOT DETECTED
Streptococcus species: DETECTED — AB
VANCOMYCIN RESISTANCE: NOT DETECTED

## 2017-08-24 LAB — BASIC METABOLIC PANEL
Anion gap: 9 (ref 5–15)
BUN: 23 mg/dL — AB (ref 6–20)
CO2: 22 mmol/L (ref 22–32)
CREATININE: 1.73 mg/dL — AB (ref 0.44–1.00)
Calcium: 8.7 mg/dL — ABNORMAL LOW (ref 8.9–10.3)
Chloride: 105 mmol/L (ref 101–111)
GFR calc Af Amer: 32 mL/min — ABNORMAL LOW (ref >60)
GFR, EST NON AFRICAN AMERICAN: 28 mL/min — AB (ref >60)
GLUCOSE: 443 mg/dL — AB (ref 65–99)
Potassium: 4.4 mmol/L (ref 3.5–5.1)
SODIUM: 136 mmol/L (ref 135–145)

## 2017-08-24 LAB — TSH: TSH: 6.197 u[IU]/mL — ABNORMAL HIGH (ref 0.350–4.500)

## 2017-08-24 LAB — I-STAT TROPONIN, ED: TROPONIN I, POC: 0.01 ng/mL (ref 0.00–0.08)

## 2017-08-24 LAB — TROPONIN I
Troponin I: 0.34 ng/mL (ref <0.03)
Troponin I: 0.33 ng/mL (ref <0.03)
Troponin I: 0.4 ng/mL (ref <0.03)
Troponin I: 0.47 ng/mL (ref <0.03)

## 2017-08-24 LAB — I-STAT CG4 LACTIC ACID, ED: LACTIC ACID, VENOUS: 1.88 mmol/L (ref 0.5–1.9)

## 2017-08-24 LAB — PROTIME-INR
INR: 1.05
Prothrombin Time: 13.6 seconds (ref 11.4–15.2)

## 2017-08-24 LAB — CBG MONITORING, ED
GLUCOSE-CAPILLARY: 114 mg/dL — AB (ref 65–99)
GLUCOSE-CAPILLARY: 153 mg/dL — AB (ref 65–99)
Glucose-Capillary: 323 mg/dL — ABNORMAL HIGH (ref 65–99)

## 2017-08-24 LAB — GLUCOSE, CAPILLARY: GLUCOSE-CAPILLARY: 204 mg/dL — AB (ref 65–99)

## 2017-08-24 LAB — BRAIN NATRIURETIC PEPTIDE: B NATRIURETIC PEPTIDE 5: 357.9 pg/mL — AB (ref 0.0–100.0)

## 2017-08-24 MED ORDER — ENOXAPARIN SODIUM 30 MG/0.3ML ~~LOC~~ SOLN: 30.0000 mg | SUBCUTANEOUS | Status: DC

## 2017-08-24 MED ORDER — INSULIN ASPART 100 UNIT/ML ~~LOC~~ SOLN
4.0000 [IU] | Freq: Three times a day (TID) | SUBCUTANEOUS | Status: DC
  Administered 2017-08-24 – 2017-08-28 (×13): 4 [IU] via SUBCUTANEOUS
  Filled 2017-08-24: qty 1

## 2017-08-24 MED ORDER — PERFLUTREN LIPID MICROSPHERE
1.0000 mL | INTRAVENOUS | Status: AC | PRN
  Administered 2017-08-24: 2 mL via INTRAVENOUS
  Filled 2017-08-24: qty 10

## 2017-08-24 MED ORDER — MORPHINE SULFATE (PF) 4 MG/ML IV SOLN
4.0000 mg | Freq: Once | INTRAVENOUS | Status: AC | PRN
  Administered 2017-08-24: 4 mg via INTRAVENOUS
  Filled 2017-08-24: qty 1

## 2017-08-24 MED ORDER — ONDANSETRON HCL 4 MG/2ML IJ SOLN: 4.0000 mg | Freq: Four times a day (QID) | INTRAMUSCULAR | Status: DC | PRN

## 2017-08-24 MED ORDER — ONDANSETRON HCL 4 MG PO TABS: 4.0000 mg | ORAL_TABLET | Freq: Four times a day (QID) | ORAL | Status: DC | PRN

## 2017-08-24 MED ORDER — ACETAMINOPHEN 325 MG PO TABS
650.0000 mg | ORAL_TABLET | Freq: Four times a day (QID) | ORAL | Status: DC | PRN
  Administered 2017-08-28: 650 mg via ORAL
  Filled 2017-08-24: qty 2

## 2017-08-24 MED ORDER — MORPHINE SULFATE (PF) 4 MG/ML IV SOLN
4.0000 mg | Freq: Once | INTRAVENOUS | Status: AC
  Administered 2017-08-24: 4 mg via INTRAVENOUS
  Filled 2017-08-24: qty 1

## 2017-08-24 MED ORDER — FUROSEMIDE 10 MG/ML IJ SOLN
40.0000 mg | Freq: Two times a day (BID) | INTRAMUSCULAR | Status: DC
  Administered 2017-08-24 – 2017-08-25 (×2): 40 mg via INTRAVENOUS
  Filled 2017-08-24 (×2): qty 4

## 2017-08-24 MED ORDER — INSULIN ASPART 100 UNIT/ML ~~LOC~~ SOLN
0.0000 [IU] | Freq: Every day | SUBCUTANEOUS | Status: DC
  Administered 2017-08-24 – 2017-08-25 (×2): 2 [IU] via SUBCUTANEOUS

## 2017-08-24 MED ORDER — LORAZEPAM 0.5 MG PO TABS
0.5000 mg | ORAL_TABLET | Freq: Four times a day (QID) | ORAL | Status: DC | PRN
  Administered 2017-08-24 – 2017-08-27 (×4): 0.5 mg via ORAL
  Filled 2017-08-24 (×4): qty 1

## 2017-08-24 MED ORDER — INSULIN ASPART 100 UNIT/ML ~~LOC~~ SOLN
7.0000 [IU] | Freq: Once | SUBCUTANEOUS | Status: AC
  Administered 2017-08-24: 7 [IU] via INTRAVENOUS
  Filled 2017-08-24: qty 1

## 2017-08-24 MED ORDER — INSULIN ASPART 100 UNIT/ML ~~LOC~~ SOLN
0.0000 [IU] | Freq: Three times a day (TID) | SUBCUTANEOUS | Status: DC
  Administered 2017-08-24: 15 [IU] via SUBCUTANEOUS
  Administered 2017-08-24: 4 [IU] via SUBCUTANEOUS
  Administered 2017-08-25: 7 [IU] via SUBCUTANEOUS
  Administered 2017-08-25: 11 [IU] via SUBCUTANEOUS
  Administered 2017-08-26 – 2017-08-27 (×3): 7 [IU] via SUBCUTANEOUS
  Administered 2017-08-27: 4 [IU] via SUBCUTANEOUS
  Administered 2017-08-28: 15 [IU] via SUBCUTANEOUS
  Administered 2017-08-28: 4 [IU] via SUBCUTANEOUS
  Filled 2017-08-24: qty 1

## 2017-08-24 MED ORDER — SIMVASTATIN 20 MG PO TABS
20.0000 mg | ORAL_TABLET | Freq: Every evening | ORAL | Status: DC
  Administered 2017-08-24 – 2017-08-27 (×4): 20 mg via ORAL
  Filled 2017-08-24 (×5): qty 1

## 2017-08-24 MED ORDER — HYDROCODONE-ACETAMINOPHEN 5-325 MG PO TABS
1.0000 | ORAL_TABLET | Freq: Four times a day (QID) | ORAL | Status: DC | PRN
  Administered 2017-08-24 – 2017-08-28 (×8): 1 via ORAL
  Filled 2017-08-24 (×8): qty 1

## 2017-08-24 MED ORDER — AMLODIPINE BESYLATE 5 MG PO TABS
5.0000 mg | ORAL_TABLET | Freq: Every day | ORAL | Status: DC
  Administered 2017-08-24 – 2017-08-28 (×5): 5 mg via ORAL
  Filled 2017-08-24 (×5): qty 1

## 2017-08-24 MED ORDER — METOPROLOL TARTRATE 50 MG PO TABS
50.0000 mg | ORAL_TABLET | Freq: Two times a day (BID) | ORAL | Status: DC
  Administered 2017-08-24 – 2017-08-28 (×9): 50 mg via ORAL
  Filled 2017-08-24 (×7): qty 1
  Filled 2017-08-24: qty 2
  Filled 2017-08-24: qty 1

## 2017-08-24 MED ORDER — LISINOPRIL 10 MG PO TABS
10.0000 mg | ORAL_TABLET | Freq: Every day | ORAL | Status: DC
  Administered 2017-08-24 – 2017-08-26 (×3): 10 mg via ORAL
  Filled 2017-08-24 (×3): qty 1

## 2017-08-24 MED ORDER — ASPIRIN EC 325 MG PO TBEC
325.0000 mg | DELAYED_RELEASE_TABLET | Freq: Every day | ORAL | Status: DC
  Administered 2017-08-24 – 2017-08-27 (×4): 325 mg via ORAL
  Filled 2017-08-24 (×4): qty 1

## 2017-08-24 MED ORDER — INFLUENZA VAC SPLIT HIGH-DOSE 0.5 ML IM SUSY
0.5000 mL | PREFILLED_SYRINGE | INTRAMUSCULAR | Status: AC
  Administered 2017-08-25: 0.5 mL via INTRAMUSCULAR
  Filled 2017-08-24: qty 0.5

## 2017-08-24 MED ORDER — POTASSIUM CHLORIDE CRYS ER 20 MEQ PO TBCR
20.0000 meq | EXTENDED_RELEASE_TABLET | Freq: Two times a day (BID) | ORAL | Status: DC
  Administered 2017-08-24 – 2017-08-26 (×5): 20 meq via ORAL
  Filled 2017-08-24 (×5): qty 1

## 2017-08-24 MED ORDER — ACETAMINOPHEN 650 MG RE SUPP: 650.0000 mg | Freq: Four times a day (QID) | RECTAL | Status: DC | PRN

## 2017-08-24 MED ORDER — FUROSEMIDE 10 MG/ML IJ SOLN
40.0000 mg | Freq: Once | INTRAMUSCULAR | Status: AC
  Administered 2017-08-24: 40 mg via INTRAVENOUS
  Filled 2017-08-24: qty 4

## 2017-08-24 MED ORDER — INSULIN GLARGINE 100 UNIT/ML ~~LOC~~ SOLN
20.0000 [IU] | Freq: Every day | SUBCUTANEOUS | Status: DC
  Administered 2017-08-24 – 2017-08-25 (×2): 20 [IU] via SUBCUTANEOUS
  Filled 2017-08-24 (×3): qty 0.2

## 2017-08-24 NOTE — Progress Notes (Signed)
  Echocardiogram 2D Echocardiogram has been performed.  [name] 08/24/2017, 4:19 PM

## 2017-08-24 NOTE — Progress Notes (Signed)
Patient admitted after midnight, please see H&P.  Await echo.  Feels better after IV lasix- continue with close monitoring of labs.  Troponin slightly elevated- trend  Eulogio Bear DO

## 2017-08-24 NOTE — Progress Notes (Signed)
  PHARMACY - PHYSICIAN COMMUNICATION CRITICAL VALUE ALERT - BLOOD CULTURE IDENTIFICATION (BCID)  Results for orders placed or performed during the hospital encounter of 08/24/17  Blood Culture ID Panel (Reflexed) (Collected: 08/24/2017  2:47 AM)  Result Value Ref Range   Enterococcus species NOT DETECTED NOT DETECTED   Vancomycin resistance NOT DETECTED NOT DETECTED   Listeria monocytogenes NOT DETECTED NOT DETECTED   Staphylococcus species NOT DETECTED NOT DETECTED   Staphylococcus aureus NOT DETECTED NOT DETECTED   Methicillin resistance NOT DETECTED NOT DETECTED   Streptococcus species DETECTED (A) NOT DETECTED   Streptococcus agalactiae NOT DETECTED NOT DETECTED   Streptococcus pneumoniae NOT DETECTED NOT DETECTED   Streptococcus pyogenes NOT DETECTED NOT DETECTED   Acinetobacter baumannii NOT DETECTED NOT DETECTED   Enterobacteriaceae species NOT DETECTED NOT DETECTED   Enterobacter cloacae complex NOT DETECTED NOT DETECTED   Escherichia coli NOT DETECTED NOT DETECTED   Klebsiella oxytoca NOT DETECTED NOT DETECTED   Klebsiella pneumoniae NOT DETECTED NOT DETECTED   Proteus species NOT DETECTED NOT DETECTED   Serratia marcescens NOT DETECTED NOT DETECTED   Carbapenem resistance NOT DETECTED NOT DETECTED   Haemophilus influenzae NOT DETECTED NOT DETECTED   Neisseria meningitidis NOT DETECTED NOT DETECTED   Pseudomonas aeruginosa NOT DETECTED NOT DETECTED   Candida albicans NOT DETECTED NOT DETECTED   Candida glabrata NOT DETECTED NOT DETECTED   Candida krusei NOT DETECTED NOT DETECTED   Candida parapsilosis NOT DETECTED NOT DETECTED   Candida tropicalis NOT DETECTED NOT DETECTED    Name of physician (or Provider) Contacted: K. Schorr  Here with CHF and COPD exacerbation. Blood cx's probably drawn on admit due to meeting "septic" criteria with tachycardia and tachypnea. No clinical signs of infection. Now 2/4 blood cx with strep species. Probably contaminant. No need to add abx  at this time unless clinically declines and shows signs of infection.  Changes to prescribed antibiotics required: No abx needed at this time  Elenor Quinones, PharmD, [place] Pharmacist Pager [phone] 08/24/2017 9:09 PM

## 2017-08-24 NOTE — ED Notes (Signed)
MD Eliseo Squires paged to inform on trop of 0.34

## 2017-08-24 NOTE — ED Triage Notes (Signed)
Pt transported from home by [facility] for CP,SHOB worsening throughout the day  Pt initially did not want EMS to transport her but became severely shob with exertion.  Pt took several nitro and neb tx today without relief. IV est by EMS,  Last BP 178/78

## 2017-08-24 NOTE — ED Provider Notes (Signed)
[facility] DEPT Provider Note   CSN: [id number] Arrival date & time: 08/24/17  0059     History   Chief Complaint Chief Complaint  Patient presents with  . Shortness of Breath    HPI [name] is a 76 y.o. female.  The history is provided by the patient.   She complains of shortness of breath all day which is getting worse. Dyspnea is worse with exertion and with laying flat. She did have some dyspnea last night and did wake up twice. She's been having some problems with her breathing for quite a while. She has used albuterol nebulizer without any relief. She complains of burning in her chest when she takes a breath, but no tightness or heaviness. She denies cough. She denies fever or chills.  Past Medical History:  Diagnosis Date  . acute myeloblastic leukemia (AML) dx'd 06/2014  . Acute pancreatitis   . Atrial flutter ([place])   . Blood transfusion without reported diagnosis 2015   Leukemia  . Chronic kidney disease, stage II (mild)   . Colon polyps 04/29/2010   TUBULAR ADENOMA AND A SERRATED ADENOMA  . COPD (chronic obstructive pulmonary disease) ([place])   . CORONARY ARTERY DISEASE 12/24/2007  . DIABETES MELLITUS, TYPE II 07/13/2007  . HYPERLIPIDEMIA 12/24/2007  . HYPERTENSION 07/13/2007  . NEPHROLITHIASIS, HX OF 12/24/2007  . Unspecified disease of pancreas     Patient Active Problem List   Diagnosis Date Noted  . COPD (chronic obstructive pulmonary disease) ([place]) 04/24/2017  . Vitamin B12 deficiency 10/29/2016  . Port catheter in place 05/30/2016  . Other pancytopenia ([place]) 08/09/2014  . Leukemia, acute ([place]) 06/08/2014  . TIA (transient ischemic attack) 05/24/2014  . CKD (chronic kidney disease) stage 3, GFR 30-59 ml/min 05/24/2014  . AML (acute myeloblastic leukemia) ([place]) 05/24/2014  . Acute leukemia ([place]) 03/02/2014  . Atrial fibrillation with RVR ([place]) 02/24/2014  . Cervical spine pain 02/24/2014  . Alkaline phosphatase elevation 02/24/2014  . Back pain  02/13/2014  . PAF- flutter 02/04/2014  . Cirrhosis of liver without mention of alcohol 01/31/2014  . Pancreatitis 01/29/2014  . Lesion of pancreas 01/29/2014  . HIP PAIN, LEFT 01/26/2008  . HYPERLIPIDEMIA 12/24/2007  . CAD, non obstructive in '02 12/24/2007  . CHEST PAIN, ATYPICAL 12/24/2007  . NEPHROLITHIASIS, HX OF 12/24/2007  . Diabetes mellitus with renal complications ([place]) 47/42/5956  . Essential hypertension 07/13/2007    Past Surgical History:  Procedure Laterality Date  . ABDOMINAL HYSTERECTOMY  unknown  . CARDIAC CATHETERIZATION  2002  . CATARACT EXTRACTION Bilateral   . COLONOSCOPY  2011  . EXCISIONAL HEMORRHOIDECTOMY  unknown  . NASAL SINUS SURGERY  unknown  . POLYPECTOMY  2011    OB History    No data available       Home Medications    Prior to Admission medications   Medication Sig Start Date End Date Taking? Authorizing Provider  amLODipine (NORVASC) 5 MG tablet Take 1 tablet (5 mg total) by mouth daily. 02/10/17   Marletta Lor, MD  aspirin EC 325 MG tablet Take 325 mg by mouth daily. 01/12/15   [provider]  Blood Glucose Monitoring Suppl (TRUE METRIX AIR GLUCOSE METER) DEVI 1 applicator by Does not apply route 3 (three) times daily between meals as needed. 06/08/17   Marletta Lor, MD  Blood Glucose Monitoring Suppl (TRUE METRIX AIR GLUCOSE METER) w/Device KIT USE AS DIRECTED 11/19/16   Marletta Lor, MD  budesonide-formoterol [facility]) 160-4.5 MCG/ACT inhaler  Inhale 2 puffs into the lungs 2 (two) times daily. 05/08/17 05/09/17  Par[place], Fonnie Mu, NP  furosemide (LASIX) 40 MG tablet Take 1 tablet (40 mg total) by mouth daily. 02/10/17   Marletta Lor, MD  HYDROcodone-acetaminophen (NORCO/VICODIN) 5-325 MG tablet Take 1 tablet by mouth every 6 (six) hours as needed for moderate pain. 07/14/17   Marletta Lor, MD  Insulin Glargine (TOUJEO SOLOSTAR) 300 UNIT/ML SOPN Inject 20 Units into the skin at bedtime. 06/26/17    Marletta Lor, MD  insulin lispro (HUMALOG) 100 UNIT/ML KiwkPen Blood sugar less than 70- no insulin Blood sugar 71-150- 4 units Blood sugar 151- 200- 6 units Blood sugar over 200-  8 units 06/26/17   Marletta Lor, MD  Insulin Pen Needle (BD ULTRA-FINE MICRO PEN NEEDLE) 32G X 6 MM MISC Use to check Blood sugar TID and prn. 06/08/17   Marletta Lor, MD  lisinopril (PRINIVIL,ZESTRIL) 10 MG tablet Take 1 tablet (10 mg total) by mouth daily. 02/10/17   Marletta Lor, MD  LORazepam (ATIVAN) 0.5 MG tablet Take 1 tablet (0.5 mg total) by mouth every 6 (six) hours as needed for anxiety. 02/15/16   Marletta Lor, MD  magnesium oxide (MAG-OX) 400 (241.3 Mg) MG tablet Take 2 tablets (800 mg total) by mouth 2 (two) times daily. 02/15/16   Marletta Lor, MD  metFORMIN (GLUCOPHAGE) 500 MG tablet Take 1 tablet (500 mg total) by mouth 2 (two) times daily with a meal. 02/10/17   Marletta Lor, MD  metoprolol tartrate (LOPRESSOR) 50 MG tablet TAKE 1 TABLET (50 MG TOTAL) BY MOUTH 2 (TWO) TIMES DAILY. 06/08/17   Marletta Lor, MD  nitroGLYCERIN (NITROSTAT) 0.4 MG SL tablet Place 1 tablet (0.4 mg total) under the tongue every 5 (five) minutes as needed for chest pain. 04/25/15   Marletta Lor, MD  pioglitazone (ACTOS) 15 MG tablet Take 1 tablet (15 mg total) by mouth daily. 02/10/17   Marletta Lor, MD  promethazine (PHENERGAN) 12.5 MG tablet Take 2 tablets (25 mg total) by mouth every 6 (six) hours as needed for nausea. 05/10/13   [name], PA-C  simvastatin (ZOCOR) 20 MG tablet Take 1 tablet (20 mg total) by mouth every evening. 02/10/17   Marletta Lor, MD  TRUE METRIX BLOOD GLUCOSE TEST test strip USE TO CHECK BLOOD SUGAR 4 TIMES DAILY AND AS NEEDED  12/26/16   Marletta Lor, MD    Family History Family History  Problem Relation Age of Onset  . Cancer Father        throat ca  . COPD Father   . Cancer Sister        uterine ca and breast  ca  . Cancer Brother        throat  . Colon cancer Neg Hx     Social History Social History  Substance Use Topics  . Smoking status: Former Smoker    Packs/day: 1.00    Years: 20.00    Types: Cigarettes    Quit date: 12/08/2000  . Smokeless tobacco: Never Used  . Alcohol use No     Allergies   Patient has no known allergies.   Review of Systems Review of Systems  All other systems reviewed and are negative.    Physical Exam Updated Vital Signs BP (!) 155/86 (BP Location: Right Arm)   Pulse (!) 108   Temp 98.9 F (37.2 C) (Oral)   Resp (!) 24  SpO2 96%   Physical Exam  Nursing note and vitals reviewed.  76 year old female, resting comfortably and in no acute distress. Vital signs are significant for hypertension, tachycardia, tachypnea. Oxygen saturation is 96%, which is normal. Head is normocephalic and atraumatic. PERRLA, EOMI. Oropharynx is clear. Neck is nontender and supple without adenopathy or JVD. Back is nontender and there is no CVA tenderness. Lungs are clear without rales, wheezes, or rhonchi. Chest is nontender. Heart has regular rate and rhythm without murmur. Abdomen is soft, flat, nontender without masses or hepatosplenomegaly and peristalsis is normoactive. Extremities have 1+ pretibial edema and 1+ presacral edema, full range of motion is present. Skin is warm and dry without rash. Neurologic: Mental status is normal, cranial nerves are intact, there are no motor or sensory deficits.  ED Treatments / Results  Labs (all labs ordered are listed, but only abnormal results are displayed) Labs Reviewed  BASIC METABOLIC PANEL - Abnormal; Notable for the following:       Result Value   Glucose, Bld 443 (*)    BUN 23 (*)    Creatinine, Ser 1.73 (*)    Calcium 8.7 (*)    GFR calc non Af Amer 28 (*)    GFR calc Af Amer 32 (*)    All other components within normal limits  CBC - Abnormal; Notable for the following:    RBC 2.91 (*)    Hemoglobin  9.1 (*)    HCT 28.2 (*)    Platelets 101 (*)    All other components within normal limits  BRAIN NATRIURETIC PEPTIDE - Abnormal; Notable for the following:    B Natriuretic Peptide 357.9 (*)    All other components within normal limits  URINALYSIS, ROUTINE W REFLEX MICROSCOPIC - Abnormal; Notable for the following:    Color, Urine STRAW (*)    Glucose, UA >=500 (*)    All other components within normal limits  CULTURE, BLOOD (ROUTINE X 2)  CULTURE, BLOOD (ROUTINE X 2)  DIFFERENTIAL  PROTIME-INR  I-STAT TROPONIN, ED  I-STAT CG4 LACTIC ACID, ED    EKG  EKG Interpretation  Date/Time:  Monday August 24 2017 01:10:04 EDT Ventricular Rate:  114 PR Interval:    QRS Duration: 93 QT Interval:  376 QTC Calculation: 518 R Axis:   83 Text Interpretation:  Sinus tachycardia Multiform ventricular premature complexes Borderline right axis deviation Low voltage, precordial leads Prolonged QT interval When compared with ECG of 05/24/2014, QT has lengthened Premature ventricular complexes are now present Confirmed by Delora Fuel ([id number]) on 08/24/2017 1:26:05 AM       Radiology Dg Chest 2 View  Result Date: 08/24/2017 CLINICAL DATA:  Shortness of breath EXAM: CHEST  2 VIEW COMPARISON:  03/27/2017 FINDINGS: Mild cardiac enlargement. No pulmonary vascular congestion or edema. Mild blunting of costophrenic angles suggesting small effusions. No pneumothorax. Mediastinal contours appear intact. Calcification of the aorta. Power port type central venous catheter on the right with tip over the low SVC region. Degenerative changes in the shoulders. IMPRESSION: Cardiac enlargement. Blunting of the costophrenic angles suggesting small effusions. No edema or consolidation. Electronically Signed   By: Lucienne Capers M.D.   On: 08/24/2017 01:40    Procedures Procedures (including critical care time)  Medications Ordered in ED Medications  furosemide (LASIX) injection 40 mg (40 mg Intravenous Given  08/24/17 0158)  morphine 4 MG/ML injection 4 mg (4 mg Intravenous Given 08/24/17 0158)  insulin aspart (novoLOG) injection 7 Units (7 Units Intravenous  Given 08/24/17 1829)     Initial Impression / Assessment and Plan / ED Course  I have reviewed the triage vital signs and the nursing notes.  Pertinent labs & imaging results that were available during my care of the patient were reviewed by me and considered in my medical decision making (see chart for details).  Dyspnea without evidence of bronchospasm on exam. Response to bronchodilators at home. Some findings suggestive of congestive heart failure. Old records are reviewed, and she has a history of acute myelogenous leukemia in remission. She underwent induction twice with daunorubicin, so she would be at risk for cardiomyopathy from that. Echocardiogram 1 year ago did show grade 1 diastolic dysfunction. Chest x-ray does show some changes of heart failure with cardiomegaly. She is given furosemide and morphine.   She had reasonably good diuresis with furosemide. BNP is moderately elevated consistent with CHF. She will be ambulated to see if she maintains adequate oxygen saturation with exertion.  On ambulation, patient was only able to walk about 20 feet before getting exceedingly dyspneic, and oxygen ration dropped to 88%. Case is discussed with Dr. Loleta Books of [facility] hospital[facility] who agrees to admit the patient.  Final Clinical Impressions(s) / ED Diagnoses   Final diagnoses:  Acute on chronic diastolic congestive heart failure (HCC)  Renal insufficiency  Normochromic normocytic anemia  Chronic obstructive pulmonary disease, unspecified COPD type ([place])  AML (acute myeloid leukemia) in remission [facility])    New Prescriptions New Prescriptions   No medications on file     Delora Fuel, MD 93/71/69 [phone]

## 2017-08-24 NOTE — ED Notes (Signed)
Family at bedside. 

## 2017-08-24 NOTE — H&P (Signed)
History and Physical  Patient Name: [name]     QAS:[id number]    DOB: [birth year]    DOA: 08/24/2017 PCP: Marletta Lor, MD  Patient coming from: Home  Chief Complaint: Chest discomfort, orthopnea, leg swelling      HPI: [name] is a 76 y.o. female with a past medical history significant for AML in remission since 2015, treated with doxurubicin, CKD III-IV baseline Cr 1.7, chronic anemia, and moderate COPD who presents with chest discomfort.  The patient was in her usual state of health (very limited mobility since her chemotherapy, widespread pain, he relates only short distances), until about the last month. Over the last several weeks, she has started to noticed leg swelling. She has not been able to notice exertional dyspnea, however she can only walk from one room to the other at baseline. Then in the last 24 hours, she has had burning chest discomfort with inspiration, worse with exertion, new orthopnea, and for the last several nights, she has been waking up with chest discomfort, relieved with sitting up and taking nitroglycerin.  ED course: -Afebrile, heart rate 108, respirations 24, blood pressure 155/86, pulse ox 96% on room air -Na 136, K 4.4, Cr 1.7 (baseline 1.7), WBC 7K, Hgb 9.1 down from baseline 10.8, platelets 101, glucose 444 -Lactic acid normal -BNP 357 -Troponin negative -INR normal -Chest x-ray showed cardiomegaly, trace pleural effusions, no frank edema -ECG showed sinus tachycardia, first-degree AV block -She was given Lasix and insulin, with good urine output, but was still dyspneic to 88% with atrial fibrillation with tachycardia and so TRH were asked to evaluate for admission     ROS: Review of Systems  Constitutional: Negative for chills and fever.  Respiratory: Positive for shortness of breath. Negative for cough, hemoptysis, sputum production and wheezing.   Cardiovascular: Positive for chest pain, orthopnea, leg swelling and PND.    Neurological: Positive for weakness.  All other systems reviewed and are negative.         Past Medical History:  Diagnosis Date  . acute myeloblastic leukemia (AML) dx'd 06/2014  . Acute pancreatitis   . Atrial flutter ([place])   . Blood transfusion without reported diagnosis 2015   Leukemia  . Chronic kidney disease, stage II (mild)   . Colon polyps 04/29/2010   TUBULAR ADENOMA AND A SERRATED ADENOMA  . COPD (chronic obstructive pulmonary disease) ([place])   . CORONARY ARTERY DISEASE 12/24/2007  . DIABETES MELLITUS, TYPE II 07/13/2007  . HYPERLIPIDEMIA 12/24/2007  . HYPERTENSION 07/13/2007  . NEPHROLITHIASIS, HX OF 12/24/2007  . Unspecified disease of pancreas     Past Surgical History:  Procedure Laterality Date  . ABDOMINAL HYSTERECTOMY  unknown  . CARDIAC CATHETERIZATION  2002  . CATARACT EXTRACTION Bilateral   . COLONOSCOPY  2011  . EXCISIONAL HEMORRHOIDECTOMY  unknown  . NASAL SINUS SURGERY  unknown  . POLYPECTOMY  2011    Social History: Patient lives with her husband.  The patient walks unassisted at baseline, uses a walker for distances >30 feet.  Nonsmoker.  Makes eye glasses, still works.  Never smoker.  No alcohol.  No Known Allergies  Family history: family history includes COPD in her father; Cancer in her brother, father, and sister; Congestive Heart Failure in her mother; Heart attack in her maternal grandmother.  Prior to Admission medications   Medication Sig Start Date End Date Taking? Authorizing Provider  amLODipine (NORVASC) 5 MG tablet Take 1 tablet (5 mg total) by mouth  daily. 02/10/17   Marletta Lor, MD  aspirin EC 325 MG tablet Take 325 mg by mouth daily. 01/12/15   [provider]  Blood Glucose Monitoring Suppl (TRUE METRIX AIR GLUCOSE METER) DEVI 1 applicator by Does not apply route 3 (three) times daily between meals as needed. 06/08/17   Marletta Lor, MD  Blood Glucose Monitoring Suppl (TRUE METRIX AIR GLUCOSE METER) w/Device KIT  USE AS DIRECTED 11/19/16   Marletta Lor, MD  budesonide-formoterol [facility]) 160-4.5 MCG/ACT inhaler Inhale 2 puffs into the lungs 2 (two) times daily. 05/08/17 05/09/17  Parrett, Fonnie Mu, NP  furosemide (LASIX) 40 MG tablet Take 1 tablet (40 mg total) by mouth daily. 02/10/17   Marletta Lor, MD  HYDROcodone-acetaminophen (NORCO/VICODIN) 5-325 MG tablet Take 1 tablet by mouth every 6 (six) hours as needed for moderate pain. 07/14/17   Marletta Lor, MD  Insulin Glargine (TOUJEO SOLOSTAR) 300 UNIT/ML SOPN Inject 20 Units into the skin at bedtime. 06/26/17   Marletta Lor, MD  insulin lispro (HUMALOG) 100 UNIT/ML KiwkPen Blood sugar less than 70- no insulin Blood sugar 71-150- 4 units Blood sugar 151- 200- 6 units Blood sugar over 200-  8 units 06/26/17   Marletta Lor, MD  Insulin Pen Needle (BD ULTRA-FINE MICRO PEN NEEDLE) 32G X 6 MM MISC Use to check Blood sugar TID and prn. 06/08/17   Marletta Lor, MD  lisinopril (PRINIVIL,ZESTRIL) 10 MG tablet Take 1 tablet (10 mg total) by mouth daily. 02/10/17   Marletta Lor, MD  LORazepam (ATIVAN) 0.5 MG tablet Take 1 tablet (0.5 mg total) by mouth every 6 (six) hours as needed for anxiety. 02/15/16   Marletta Lor, MD  magnesium oxide (MAG-OX) 400 (241.3 Mg) MG tablet Take 2 tablets (800 mg total) by mouth 2 (two) times daily. 02/15/16   Marletta Lor, MD  metFORMIN (GLUCOPHAGE) 500 MG tablet Take 1 tablet (500 mg total) by mouth 2 (two) times daily with a meal. 02/10/17   Marletta Lor, MD  metoprolol tartrate (LOPRESSOR) 50 MG tablet TAKE 1 TABLET (50 MG TOTAL) BY MOUTH 2 (TWO) TIMES DAILY. 06/08/17   Marletta Lor, MD  nitroGLYCERIN (NITROSTAT) 0.4 MG SL tablet Place 1 tablet (0.4 mg total) under the tongue every 5 (five) minutes as needed for chest pain. 04/25/15   Marletta Lor, MD  promethazine (PHENERGAN) 12.5 MG tablet Take 2 tablets (25 mg total) by mouth every 6 (six) hours as  needed for nausea. 05/10/13   [name], PA-C  simvastatin (ZOCOR) 20 MG tablet Take 1 tablet (20 mg total) by mouth every evening. 02/10/17   Marletta Lor, MD  TRUE METRIX BLOOD GLUCOSE TEST test strip USE TO CHECK BLOOD SUGAR 4 TIMES DAILY AND AS NEEDED  12/26/16   Marletta Lor, MD       Physical Exam: BP (!) 128/52   Pulse (!) 116   Temp 98.9 F (37.2 C) (Oral)   Resp 19   SpO2 98%  General appearance: Well-developed, obese adult female, alert and in moderate distress from dyspnea.   Eyes: Anicteric, conjunctiva pink, lids and lashes normal. PERRL.    ENT: No nasal deformity, discharge, epistaxis.  Hearing normal. OP moist without lesions.   Neck: No neck masses.  Trachea midline.  No thyromegaly/tenderness. Lymph: No cervical or supraclavicular lymphadenopathy. Skin: Warm and dry.  No jaundice.  No suspicious rashes or lesions. Cardiac: Tachycardia, nl S1-S2, no murmurs appreciated,  heart sounds distant.  Capillary refill is brisk.  JVP not visible.  2+ LE edema to knees.  Radial pulses 2+ and symmetric. Respiratory: Normal respiratory rate and rhythm.  CTAB without rales or wheezes, but lung sounds distant. Abdomen: Abdomen soft.  No TTP. No ascites, distension, hepatosplenomegaly.   MSK: No deformities or effusions.  No cyanosis or clubbing. Neuro: Cranial nerves normal. Speech is fluent.  Muscle strength normal.    Psych: Sensorium intact and responding to questions, attention normal.  Behavior appropriate.  Affect blunted by discomfort.  Judgment and insight appear normal.     Labs on Admission:  I have personally reviewed following labs and imaging studies: CBC:  Recent Labs Lab 08/24/17 0129 08/24/17 0130  WBC 7.0  --   NEUTROABS  --  5.0  HGB 9.1*  --   HCT 28.2*  --   MCV 96.9  --   PLT 101*  --    Basic Metabolic Panel:  Recent Labs Lab 08/24/17 0129  NA 136  K 4.4  CL 105  CO2 22  GLUCOSE 443*  BUN 23*  CREATININE 1.73*  CALCIUM  8.7*   GFR: CrCl cannot be calculated (Unknown ideal weight.).  Liver Function Tests: No results for input(s): AST, ALT, ALKPHOS, BILITOT, PROT, ALBUMIN in the last 168 hours. No results for input(s): LIPASE, AMYLASE in the last 168 hours. No results for input(s): AMMONIA in the last 168 hours. Coagulation Profile:  Recent Labs Lab 08/24/17 0310  INR 1.05   Cardiac Enzymes: No results for input(s): CKTOTAL, CKMB, CKMBINDEX, TROPONINI in the last 168 hours. BNP (last 3 results) No results for input(s): PROBNP in the last 8760 hours. HbA1C: No results for input(s): HGBA1C in the last 72 hours. CBG: No results for input(s): GLUCAP in the last 168 hours. Lipid Profile: No results for input(s): CHOL, HDL, LDLCALC, TRIG, CHOLHDL, LDLDIRECT in the last 72 hours. Thyroid Function Tests: No results for input(s): TSH, T4TOTAL, FREET4, T3FREE, THYROIDAB in the last 72 hours. Anemia Panel: No results for input(s): VITAMINB12, FOLATE, FERRITIN, TIBC, IRON, RETICCTPCT in the last 72 hours. Sepsis Labs: Lactic acid normal Invalid input(s): PROCALCITONIN, LACTICIDVEN No results found for this or any previous visit (from the past 240 hour(s)).       Radiological Exams on Admission: Personally reviewed CXR shows cardiomegaly, trace effusions, no pneumonia, no obvoius edema, Sensitivity reduced given body habitus: Dg Chest 2 View  Result Date: 08/24/2017 CLINICAL DATA:  Shortness of breath EXAM: CHEST  2 VIEW COMPARISON:  03/27/2017 FINDINGS: Mild cardiac enlargement. No pulmonary vascular congestion or edema. Mild blunting of costophrenic angles suggesting small effusions. No pneumothorax. Mediastinal contours appear intact. Calcification of the aorta. Power port type central venous catheter on the right with tip over the low SVC region. Degenerative changes in the shoulders. IMPRESSION: Cardiac enlargement. Blunting of the costophrenic angles suggesting small effusions. No edema or  consolidation. Electronically Signed   By: Lucienne Capers M.D.   On: 08/24/2017 01:40    EKG: Independently reviewed. Rate 114, QTc 518, it appears that this is sinus tachycardia, regular, although PR interval overlaps the T-wave and P waves are difficult to discern.  Echocardiogram 2017: Report reviewed EF 60-65% Mild LVH Grade 1 DD       Assessment/Plan Principal Problem:   Acute CHF (congestive heart failure) (HCC) Active Problems:   Diabetes mellitus with renal complications (HCC)   Essential hypertension   Back pain   CKD (chronic kidney disease) stage 3, GFR  30-59 ml/min   AML (acute myeloblastic leukemia) (HCC)   Other pancytopenia (HCC)   COPD (chronic obstructive pulmonary disease) ([place])  1. Acute CHF:  New-onset, type unknown. Risk factor previous grade 1 diastolic dysfunction, also doxorubicin use, coronary disease.   -Trend troponin -Furosemide 40 mg IV twice a day  -K supplement -Strict I/Os, daily weights, telemetry  -Daily monitoring renal function -Echocardiogram ordered   2. COPD:  No clear wheezing, favor that this is all CHF. -Await med rec and continue Symbicort if using  3. Hypertension:  Hypertensive at admission. -Continue lisinopril, metoprolol, amlodipine -Continue aspirin, statin  4. Diabetes:  Hyperglycemic at admission. -Continue long-acting insulin -Mealtime and SSI ordered -Hold metformin -Stop Actos given CHF  5. CKD IV:  Baseline Cr 1.7, eGFR 28. -Monitor renal function daily  6. Anemia and thrombocytopenia:  Slightly down from baseline.  Just saw her Oncologist in the last few weeks.      DVT prophylaxis: SCDs  Code Status: FULL  Family Communication: Daughter at bedside  Disposition Plan: Anticipate IV Lasix and close I/Os, daily weithts.  Obtain echo and trend troponins.  If significantly better respirations tomorrow, perhaps home, otherwise may need to be inpatient for further diuresis. Consults called:  None Admission status: OBS At the point of initial evaluation, it is my clinical opinion that admission for OBSERVATION is reasonable and necessary because the patient's presenting complaints in the context of their chronic conditions represent sufficient risk of deterioration or significant morbidity to constitute reasonable grounds for close observation in the hospital setting, but that the patient may be medically stable for discharge from the hospital within 24 to 48 hours.    Medical decision making: Patient seen at 5:10 AM on 08/24/2017.  The patient was discussed with Dr. Roxanne Mins.  What exists of the patient's chart was reviewed in depth and summarized above.  Clinical condition: stable.        Edwin Dada [facility] Pager [phone]

## 2017-08-24 NOTE — ED Notes (Signed)
cbg was 153

## 2017-08-24 NOTE — ED Notes (Signed)
Hospital bed ordered for patient.

## 2017-08-24 NOTE — ED Notes (Signed)
cbg was 323

## 2017-08-24 NOTE — ED Notes (Signed)
Pt ambulated to BR & back w/ great difficulty. Pt became winded & very SHOB w/ sats @ 88% & HR @ 131. Gave pt chance to catch breath in BR before attempting to ambulate back to RM. EDP made aware.

## 2017-08-24 NOTE — ED Notes (Signed)
Report given to Grace

## 2017-08-25 ENCOUNTER — Ambulatory Visit: Payer: Medicare HMO

## 2017-08-25 DIAGNOSIS — Z7951 Long term (current) use of inhaled steroids: Secondary | ICD-10-CM | POA: Diagnosis not present

## 2017-08-25 DIAGNOSIS — J449 Chronic obstructive pulmonary disease, unspecified: Secondary | ICD-10-CM | POA: Diagnosis present

## 2017-08-25 DIAGNOSIS — E1329 Other specified diabetes mellitus with other diabetic kidney complication: Secondary | ICD-10-CM | POA: Diagnosis not present

## 2017-08-25 DIAGNOSIS — E875 Hyperkalemia: Secondary | ICD-10-CM | POA: Diagnosis not present

## 2017-08-25 DIAGNOSIS — D696 Thrombocytopenia, unspecified: Secondary | ICD-10-CM | POA: Diagnosis present

## 2017-08-25 DIAGNOSIS — I5021 Acute systolic (congestive) heart failure: Secondary | ICD-10-CM | POA: Diagnosis not present

## 2017-08-25 DIAGNOSIS — I509 Heart failure, unspecified: Secondary | ICD-10-CM | POA: Diagnosis not present

## 2017-08-25 DIAGNOSIS — I2510 Atherosclerotic heart disease of native coronary artery without angina pectoris: Secondary | ICD-10-CM | POA: Diagnosis present

## 2017-08-25 DIAGNOSIS — N184 Chronic kidney disease, stage 4 (severe): Secondary | ICD-10-CM | POA: Diagnosis present

## 2017-08-25 DIAGNOSIS — N058 Unspecified nephritic syndrome with other morphologic changes: Secondary | ICD-10-CM | POA: Diagnosis not present

## 2017-08-25 DIAGNOSIS — I25110 Atherosclerotic heart disease of native coronary artery with unstable angina pectoris: Secondary | ICD-10-CM

## 2017-08-25 DIAGNOSIS — Z23 Encounter for immunization: Secondary | ICD-10-CM | POA: Diagnosis present

## 2017-08-25 DIAGNOSIS — Z6841 Body Mass Index (BMI) 40.0 and over, adult: Secondary | ICD-10-CM | POA: Diagnosis not present

## 2017-08-25 DIAGNOSIS — E1165 Type 2 diabetes mellitus with hyperglycemia: Secondary | ICD-10-CM | POA: Diagnosis present

## 2017-08-25 DIAGNOSIS — D649 Anemia, unspecified: Secondary | ICD-10-CM | POA: Diagnosis present

## 2017-08-25 DIAGNOSIS — I214 Non-ST elevation (NSTEMI) myocardial infarction: Secondary | ICD-10-CM | POA: Diagnosis present

## 2017-08-25 DIAGNOSIS — Z794 Long term (current) use of insulin: Secondary | ICD-10-CM | POA: Diagnosis not present

## 2017-08-25 DIAGNOSIS — Z79899 Other long term (current) drug therapy: Secondary | ICD-10-CM | POA: Diagnosis not present

## 2017-08-25 DIAGNOSIS — N183 Chronic kidney disease, stage 3 (moderate): Secondary | ICD-10-CM | POA: Diagnosis not present

## 2017-08-25 DIAGNOSIS — Z9221 Personal history of antineoplastic chemotherapy: Secondary | ICD-10-CM | POA: Diagnosis not present

## 2017-08-25 DIAGNOSIS — I5041 Acute combined systolic (congestive) and diastolic (congestive) heart failure: Secondary | ICD-10-CM | POA: Diagnosis not present

## 2017-08-25 DIAGNOSIS — E1122 Type 2 diabetes mellitus with diabetic chronic kidney disease: Secondary | ICD-10-CM | POA: Diagnosis present

## 2017-08-25 DIAGNOSIS — I4891 Unspecified atrial fibrillation: Secondary | ICD-10-CM | POA: Diagnosis present

## 2017-08-25 DIAGNOSIS — Z8249 Family history of ischemic heart disease and other diseases of the circulatory system: Secondary | ICD-10-CM | POA: Diagnosis not present

## 2017-08-25 DIAGNOSIS — I10 Essential (primary) hypertension: Secondary | ICD-10-CM | POA: Diagnosis not present

## 2017-08-25 DIAGNOSIS — E0821 Diabetes mellitus due to underlying condition with diabetic nephropathy: Secondary | ICD-10-CM | POA: Diagnosis not present

## 2017-08-25 DIAGNOSIS — E785 Hyperlipidemia, unspecified: Secondary | ICD-10-CM | POA: Diagnosis present

## 2017-08-25 DIAGNOSIS — Z87891 Personal history of nicotine dependence: Secondary | ICD-10-CM | POA: Diagnosis not present

## 2017-08-25 DIAGNOSIS — I5043 Acute on chronic combined systolic (congestive) and diastolic (congestive) heart failure: Secondary | ICD-10-CM | POA: Diagnosis present

## 2017-08-25 DIAGNOSIS — C9201 Acute myeloblastic leukemia, in remission: Secondary | ICD-10-CM | POA: Diagnosis present

## 2017-08-25 DIAGNOSIS — I272 Pulmonary hypertension, unspecified: Secondary | ICD-10-CM | POA: Diagnosis present

## 2017-08-25 DIAGNOSIS — I130 Hypertensive heart and chronic kidney disease with heart failure and stage 1 through stage 4 chronic kidney disease, or unspecified chronic kidney disease: Secondary | ICD-10-CM | POA: Diagnosis present

## 2017-08-25 DIAGNOSIS — R0601 Orthopnea: Secondary | ICD-10-CM | POA: Diagnosis present

## 2017-08-25 DIAGNOSIS — Z7982 Long term (current) use of aspirin: Secondary | ICD-10-CM | POA: Diagnosis not present

## 2017-08-25 LAB — GLUCOSE, CAPILLARY
GLUCOSE-CAPILLARY: 72 mg/dL (ref 65–99)
GLUCOSE-CAPILLARY: 140 mg/dL — AB (ref 65–99)
Glucose-Capillary: 207 mg/dL — ABNORMAL HIGH (ref 65–99)
Glucose-Capillary: 272 mg/dL — ABNORMAL HIGH (ref 65–99)
Glucose-Capillary: 103 mg/dL — ABNORMAL HIGH (ref 65–99)
Glucose-Capillary: 215 mg/dL — ABNORMAL HIGH (ref 65–99)

## 2017-08-25 LAB — CBC
HEMATOCRIT: 28.7 % — AB (ref 36.0–46.0)
HEMOGLOBIN: 8.9 g/dL — AB (ref 12.0–15.0)
MCH: 30.1 pg (ref 26.0–34.0)
MCHC: 31 g/dL (ref 30.0–36.0)
MCV: 97 fL (ref 78.0–100.0)
Platelets: 141 10*3/uL — ABNORMAL LOW (ref 150–400)
RBC: 2.96 MIL/uL — ABNORMAL LOW (ref 3.87–5.11)
RDW: 14.6 % (ref 11.5–15.5)
WBC: 5.8 10*3/uL (ref 4.0–10.5)

## 2017-08-25 LAB — BASIC METABOLIC PANEL
ANION GAP: 7 (ref 5–15)
BUN: 28 mg/dL — ABNORMAL HIGH (ref 6–20)
CHLORIDE: 107 mmol/L (ref 101–111)
CO2: 25 mmol/L (ref 22–32)
CREATININE: 2.05 mg/dL — AB (ref 0.44–1.00)
Calcium: 8.8 mg/dL — ABNORMAL LOW (ref 8.9–10.3)
GFR calc Af Amer: 26 mL/min — ABNORMAL LOW (ref >60)
GFR calc non Af Amer: 23 mL/min — ABNORMAL LOW (ref >60)
Glucose, Bld: 148 mg/dL — ABNORMAL HIGH (ref 65–99)
Potassium: 4.4 mmol/L (ref 3.5–5.1)
Sodium: 139 mmol/L (ref 135–145)

## 2017-08-25 LAB — T4, FREE: Free T4: 1.06 ng/dL (ref 0.61–1.12)

## 2017-08-25 MED ORDER — FUROSEMIDE 10 MG/ML IJ SOLN
40.0000 mg | Freq: Every day | INTRAMUSCULAR | Status: DC
  Administered 2017-08-26: 40 mg via INTRAVENOUS
  Filled 2017-08-25: qty 4

## 2017-08-25 MED ORDER — GI COCKTAIL ~~LOC~~
30.0000 mL | Freq: Once | ORAL | Status: AC
  Administered 2017-08-25: 30 mL via ORAL
  Filled 2017-08-25: qty 30

## 2017-08-25 NOTE — Progress Notes (Signed)
PROGRESS NOTE    [name]  TGY:[id number] DOB: 1941/05/04 DOA: 08/24/2017 PCP: Marletta Lor, MD   Outpatient Specialists:     Brief Narrative:  [name] is a 76 y.o. female with a past medical history significant for AML in remission since 2015, treated with doxurubicin, CKD III-IV baseline Cr 1.7, chronic anemia, and moderate COPD who presents with chest discomfort.  The patient was in her usual state of health (very limited mobility since her chemotherapy, widespread pain, he relates only short distances), until about the last month. Over the last several weeks, she has started to noticed leg swelling. She has not been able to notice exertional dyspnea, however she can only walk from one room to the other at baseline. Then in the last 24 hours, she has had burning chest discomfort with inspiration, worse with exertion, new orthopnea, and for the last several nights, she has been waking up with chest discomfort, relieved with sitting up and taking nitroglycerin.  Assessment & Plan:   Principal Problem:   Acute CHF (congestive heart failure) (HCC) Active Problems:   Diabetes mellitus with renal complications (HCC)   Essential hypertension   Back pain   CKD (chronic kidney disease) stage 3, GFR 30-59 ml/min   AML (acute myeloblastic leukemia) (HCC)   Other pancytopenia (HCC)   COPD (chronic obstructive pulmonary disease) (HCC)   Acute systolic CHF - EF 28-76% with hypokinesis   -troponin trending up -Furosemide 40 mg IV daily, not much diuresis if I/Os correct -Strict I/Os, daily weights, telemetry  -Daily monitoring renal function -Echocardiogram: Left ventricle: The cavity size was normal. There was moderate   concentric hypertrophy. Systolic function was mildly to   moderately reduced. The estimated ejection fraction was in the   range of 40% to 45%. Hypokinesis of all mid-apical segments.   Apical akinesis. 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  Hypertension:  Hypertensive at admission. -Continue lisinopril, metoprolol, amlodipine -Continue aspirin, statin  Elevated TSH -check free t4  Diabetes:  Hyperglycemic at admission. -Continue long-acting insulin -Mealtime and SSI ordered -Hold metformin -Stop Actos given CHF  CKD IV:  Baseline Cr 1.7, eGFR 28. -Monitor renal function daily   Anemia and thrombocytopenia:  Slightly down from baseline.  Just saw her Oncologist in the last few weeks.  1/2 blood cultures-- + for gram + cocci -await final results    DVT prophylaxis:  SCD's  Code Status: Full Code   Family Communication: At bedside  Disposition Plan:     Consultants:   cards  Procedures:  echo  Subjective: Breathing better but not back to baseline  Objective: Vitals:   08/25/17 0006 08/25/17 0518 08/25/17 0830 08/25/17 1145  BP: (!) 120/57 (!) 143/60 (!) 122/56 132/63  Pulse: 73 86 74 67  Resp: 20 18  (!) 21  Temp: 98.5 F (36.9 C) 98.1 F (36.7 C) 97.9 F (36.6 C) 97.9 F (36.6 C)  TempSrc: Oral Oral Oral Oral  SpO2: 96% 100% 96% 96%  Weight:  106.1 kg (234 lb)    Height:        Intake/Output Summary (Last 24 hours) at 08/25/17 1220 Last data filed at 08/25/17 1143  Gross per 24 hour  Intake              480 ml  Output  650 ml  Net             -170 ml   Filed Weights   08/24/17 1811 08/25/17 0518  Weight: 106.3 kg (234 lb 6.4 oz) 106.1 kg (234 lb)    Examination:  General exam: chronically ill appearing Respiratory system: diminished, no wheezing, no crackles Cardiovascular system: rrr, +pitting LE edema Gastrointestinal system: +BS, soft Central nervous system: Alert and oriented. No focal neurological deficits. Extremities: moves all 4 ext Skin: No rashes, lesions or ulcers Psychiatry: Judgement and insight appear normal. Mood & affect  appropriate.     Data Reviewed: I have personally reviewed following labs and imaging studies  CBC:  Recent Labs Lab 08/24/17 0129 08/24/17 0130 08/25/17 0504  WBC 7.0  --  5.8  NEUTROABS  --  5.0  --   HGB 9.1*  --  8.9*  HCT 28.2*  --  28.7*  MCV 96.9  --  97.0  PLT 101*  --  202*   Basic Metabolic Panel:  Recent Labs Lab 08/24/17 0129 08/25/17 0504  NA 136 139  K 4.4 4.4  CL 105 107  CO2 22 25  GLUCOSE 443* 148*  BUN 23* 28*  CREATININE 1.73* 2.05*  CALCIUM 8.7* 8.8*   GFR: Estimated Creatinine Clearance: 27.1 mL/min (A) (by C-G formula based on SCr of 2.05 mg/dL (H)). Liver Function Tests: No results for input(s): AST, ALT, ALKPHOS, BILITOT, PROT, ALBUMIN in the last 168 hours. No results for input(s): LIPASE, AMYLASE in the last 168 hours. No results for input(s): AMMONIA in the last 168 hours. Coagulation Profile:  Recent Labs Lab 08/24/17 0310  INR 1.05   Cardiac Enzymes:  Recent Labs Lab 08/24/17 0810 08/24/17 1049 08/24/17 1829 08/24/17 2242  TROPONINI 0.34* 0.33* 0.40* 0.47*   BNP (last 3 results) No results for input(s): PROBNP in the last 8760 hours. HbA1C: No results for input(s): HGBA1C in the last 72 hours. CBG:  Recent Labs Lab 08/24/17 0725 08/24/17 1318 08/24/17 1744 08/24/17 2037 08/25/17 0727  GLUCAP 323* 114* 153* 204* 207*   Lipid Profile: No results for input(s): CHOL, HDL, LDLCALC, TRIG, CHOLHDL, LDLDIRECT in the last 72 hours. Thyroid Function Tests:  Recent Labs  08/24/17 0800  TSH 6.197*   Anemia Panel: No results for input(s): VITAMINB12, FOLATE, FERRITIN, TIBC, IRON, RETICCTPCT in the last 72 hours. Urine analysis:    Component Value Date/Time   COLORURINE STRAW (A) 08/24/2017 0247   APPEARANCEUR CLEAR 08/24/2017 0247   LABSPEC 1.009 08/24/2017 0247   LABSPEC 1.015 08/04/2014 1439   PHURINE 5.0 08/24/2017 0247   GLUCOSEU >=500 (A) 08/24/2017 0247   GLUCOSEU Negative 08/04/2014 1439   HGBUR  NEGATIVE 08/24/2017 0247   HGBUR negative 10/10/2010 1607   BILIRUBINUR NEGATIVE 08/24/2017 0247   BILIRUBINUR Negative 08/04/2014 1439   KETONESUR NEGATIVE 08/24/2017 0247   PROTEINUR NEGATIVE 08/24/2017 0247   UROBILINOGEN 0.2 08/04/2014 1439   NITRITE NEGATIVE 08/24/2017 0247   LEUKOCYTESUR NEGATIVE 08/24/2017 0247   LEUKOCYTESUR Negative 08/04/2014 1439     ) Recent Results (from the past 240 hour(s))  Culture, blood (Routine x 2)     Status: None (Preliminary result)   Collection Time: 08/24/17  2:47 AM  Result Value Ref Range Status   Specimen Description BLOOD LEFT HAND  Final   Special Requests   Final    BOTTLES DRAWN AEROBIC AND ANAEROBIC Blood Culture adequate volume   Culture  Setup Time   Final    GRAM POSITIVE  COCCI IN CHAINS IN BOTH AEROBIC AND ANAEROBIC BOTTLES CRITICAL RESULT CALLED TO, READ BACK BY AND VERIFIED WITH: Karlene Einstein PHARMD 2100 08/24/17 A BROWNING    Culture GRAM POSITIVE COCCI  Final   Report Status PENDING  Incomplete  Blood Culture ID Panel (Reflexed)     Status: Abnormal   Collection Time: 08/24/17  2:47 AM  Result Value Ref Range Status   Enterococcus species NOT DETECTED NOT DETECTED Final   Vancomycin resistance NOT DETECTED NOT DETECTED Final   Listeria monocytogenes NOT DETECTED NOT DETECTED Final   Staphylococcus species NOT DETECTED NOT DETECTED Final   Staphylococcus aureus NOT DETECTED NOT DETECTED Final   Methicillin resistance NOT DETECTED NOT DETECTED Final   Streptococcus species DETECTED (A) NOT DETECTED Final    Comment: Not Enterococcus species, Streptococcus agalactiae, Streptococcus pyogenes, or Streptococcus pneumoniae. CRITICAL RESULT CALLED TO, READ BACK BY AND VERIFIED WITH: Karlene Einstein PHARMD 2100 08/24/17 A BROWNING    Streptococcus agalactiae NOT DETECTED NOT DETECTED Final   Streptococcus pneumoniae NOT DETECTED NOT DETECTED Final   Streptococcus pyogenes NOT DETECTED NOT DETECTED Final   Acinetobacter baumannii  NOT DETECTED NOT DETECTED Final   Enterobacteriaceae species NOT DETECTED NOT DETECTED Final   Enterobacter cloacae complex NOT DETECTED NOT DETECTED Final   Escherichia coli NOT DETECTED NOT DETECTED Final   Klebsiella oxytoca NOT DETECTED NOT DETECTED Final   Klebsiella pneumoniae NOT DETECTED NOT DETECTED Final   Proteus species NOT DETECTED NOT DETECTED Final   Serratia marcescens NOT DETECTED NOT DETECTED Final   Carbapenem resistance NOT DETECTED NOT DETECTED Final   Haemophilus influenzae NOT DETECTED NOT DETECTED Final   Neisseria meningitidis NOT DETECTED NOT DETECTED Final   Pseudomonas aeruginosa NOT DETECTED NOT DETECTED Final   Candida albicans NOT DETECTED NOT DETECTED Final   Candida glabrata NOT DETECTED NOT DETECTED Final   Candida krusei NOT DETECTED NOT DETECTED Final   Candida parapsilosis NOT DETECTED NOT DETECTED Final   Candida tropicalis NOT DETECTED NOT DETECTED Final      Anti-infectives    None       Radiology Studies: Dg Chest 2 View  Result Date: 08/24/2017 CLINICAL DATA:  Shortness of breath EXAM: CHEST  2 VIEW COMPARISON:  03/27/2017 FINDINGS: Mild cardiac enlargement. No pulmonary vascular congestion or edema. Mild blunting of costophrenic angles suggesting small effusions. No pneumothorax. Mediastinal contours appear intact. Calcification of the aorta. Power port type central venous catheter on the right with tip over the low SVC region. Degenerative changes in the shoulders. IMPRESSION: Cardiac enlargement. Blunting of the costophrenic angles suggesting small effusions. No edema or consolidation. Electronically Signed   By: Lucienne Capers M.D.   On: 08/24/2017 01:40        Scheduled Meds: . amLODipine  5 mg Oral Daily  . aspirin EC  325 mg Oral Daily  . furosemide  40 mg Intravenous BID  . insulin aspart  0-20 Units Subcutaneous TID WC  . insulin aspart  0-5 Units Subcutaneous QHS  . insulin aspart  4 Units Subcutaneous TID WC  . insulin  glargine  20 Units Subcutaneous QHS  . lisinopril  10 mg Oral Daily  . metoprolol tartrate  50 mg Oral BID  . potassium chloride  20 mEq Oral BID  . simvastatin  20 mg Oral QPM   Continuous Infusions:   LOS: 0 days    Time spent:35 min    Voyd Groft Alison Stalling, DO [facility] Pager [phone]  If 7PM-7AM, please contact  night-coverage [email or web address] Password [email or web address] 08/25/2017, 12:20 PM

## 2017-08-25 NOTE — Progress Notes (Signed)
Patient's CBG 72, pt is diaphoretic, alert and oriented, orange juice given. CBG rechecked 103. Will monitor accordingly.

## 2017-08-25 NOTE — Consult Note (Signed)
Cardiology Consultation:   Patient ID: [name]; [id number]; 01-28-1941   Admit date: 08/24/2017 Date of Consult: 08/25/2017  Primary Care Provider: Marletta Lor, MD Primary Cardiologist: New to Dr. Acie Fredrickson    Patient Profile:   [name] is a 76 y.o. female with a hx of non obstructive CAD, HTN, HLD, CKD stage III-IV with baseline Scr of 1.7, AML in remission (tx with Doxurubicin), COPD and chronic anemia who is being seen today for the evaluation of elevated troponin at the request of Dr. Eliseo Squires.    Cath 2002: LM patent, LAD diffuse 20%, LCX patent, RCA ostial 50% w/ dampening of pressures. Managed medically.   Seen by Dr. Harl Bowie 01/2014 when she was admitted with chest pain. Felt atypical. Also noted intermittent atrial flutter. Given new diagnosis of pancreatic mass and evidence of cirrhosis by imaging--> defer anticoagulation. Lexiscan negative for ischemic.   History of Present Illness:   [name] presented to ER yesterday with progressive worsening of DOE. Limited ambulation since chemo however symptoms worsen in past one month. Noted burning chest discomfort that relives with up sitting and nitro. Has orthopnea and LE edema. Admitted for further  Evaluation. BNP 357. Treated with IV lasix. Scr worsen to 2.05 from 1.73. Echo showed newly reduced LVEF of 40-45%, apical hypokinesis. Moderate pulmonary hypertension. hgb 8.9. Troponin 0.34-->0.33-->0.4-->0.47. TSH 6.1. 1/2 blood cx grown + cocci.   Her breathing has improved. No recurrent burning chest pain.    Past Medical History:  Diagnosis Date  . acute myeloblastic leukemia (AML) dx'd 06/2014  . Acute pancreatitis   . Atrial flutter ([place])   . Blood transfusion without reported diagnosis 2015   Leukemia  . Chronic kidney disease, stage II (mild)   . Colon polyps 04/29/2010   TUBULAR ADENOMA AND A SERRATED ADENOMA  . COPD (chronic obstructive pulmonary disease) ([place])   . CORONARY ARTERY DISEASE 12/24/2007  .  DIABETES MELLITUS, TYPE II 07/13/2007  . HYPERLIPIDEMIA 12/24/2007  . HYPERTENSION 07/13/2007  . NEPHROLITHIASIS, HX OF 12/24/2007  . Unspecified disease of pancreas     Past Surgical History:  Procedure Laterality Date  . ABDOMINAL HYSTERECTOMY  unknown  . CARDIAC CATHETERIZATION  2002  . CATARACT EXTRACTION Bilateral   . COLONOSCOPY  2011  . EXCISIONAL HEMORRHOIDECTOMY  unknown  . NASAL SINUS SURGERY  unknown  . POLYPECTOMY  2011      Inpatient Medications: Scheduled Meds: . amLODipine  5 mg Oral Daily  . aspirin EC  325 mg Oral Daily  . [START ON 08/26/2017] furosemide  40 mg Intravenous Daily  . insulin aspart  0-20 Units Subcutaneous TID WC  . insulin aspart  0-5 Units Subcutaneous QHS  . insulin aspart  4 Units Subcutaneous TID WC  . insulin glargine  20 Units Subcutaneous QHS  . lisinopril  10 mg Oral Daily  . metoprolol tartrate  50 mg Oral BID  . potassium chloride  20 mEq Oral BID  . simvastatin  20 mg Oral QPM   Continuous Infusions:  PRN Meds: acetaminophen **OR** acetaminophen, HYDROcodone-acetaminophen, LORazepam, ondansetron **OR** ondansetron (ZOFRAN) IV  Allergies:   No Known Allergies  Social History:   Social History   Social History  . Marital status: Married    Spouse name: N/A  . Number of children: N/A  . Years of education: N/A   Occupational History  . works in a lab     makes glasses   Social History Main Topics  . Smoking status:  Former Smoker    Packs/day: 1.00    Years: 20.00    Types: Cigarettes    Quit date: 12/08/2000  . Smokeless tobacco: Never Used  . Alcohol use No  . Drug use: No  . Sexual activity: Not on file   Other Topics Concern  . 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 Cancer Father        throat ca  . COPD Father   . Cancer Sister        uterine ca and breast ca  . Cancer Brother         throat  . Congestive Heart Failure Mother   . Heart attack Maternal Grandmother   . Colon cancer Neg Hx      ROS:  Please see the history of present illness.  ROS All other ROS reviewed and negative.     Physical Exam/Data:   Vitals:   08/25/17 0006 08/25/17 0518 08/25/17 0830 08/25/17 1145  BP: (!) 120/57 (!) 143/60 (!) 122/56 132/63  Pulse: 73 86 74 67  Resp: 20 18  (!) 21  Temp: 98.5 F (36.9 C) 98.1 F (36.7 C) 97.9 F (36.6 C) 97.9 F (36.6 C)  TempSrc: Oral Oral Oral Oral  SpO2: 96% 100% 96% 96%  Weight:  234 lb (106.1 kg)    Height:        Intake/Output Summary (Last 24 hours) at 08/25/17 1334 Last data filed at 08/25/17 1143  Gross per 24 hour  Intake              480 ml  Output              650 ml  Net             -170 ml   Filed Weights   08/24/17 1811 08/25/17 0518  Weight: 234 lb 6.4 oz (106.3 kg) 234 lb (106.1 kg)   Body mass index is 42.8 kg/m.  General:  Obese female in no acute distress HEENT: normal Lymph: no adenopathy Neck: no JVD Endocrine:  No thryomegaly Vascular: No carotid bruits; FA pulses 2+ bilaterally without bruits  Cardiac:  normal S1, S2; RRR; no murmur Lungs:  clear to auscultation bilaterally, no wheezing, rhonchi or rales  Abd: soft, nontender, no hepatomegaly  Ext: 1 + BL LE edema Musculoskeletal:  No deformities, BUE and BLE strength normal and equal Skin: warm and dry  Neuro:  CNs 2-12 intact, no focal abnormalities noted Psych:  Normal affect   EKG:  The EKG was personally reviewed and demonstrates:  Sinus tachycardia at rate of 114 bpm, PVC and QT/QTc 376/518 ms Telemetry:  Telemetry was personally reviewed and demonstrates:  Sinus rhythm with PVCs  Relevant CV Studies: Cath 2002  VENTRICULOGRAPHY:  Performed from single plane area.  Projected ejection fraction was 70% with no segmental wall motion abnormalities.  SELECTIVE CORONARY ANGIOGRAPHY: 1. Left main coronary was free of flow-limiting disease. 2. Left  anterior descending artery had proximal diffuse 20% stenosis involving    the first diagonal branch with a 40% ostial stenosis. 3. Left circumflex coronary artery was a large caliber vessel with no evidence    of flow-limiting coronary artery disease. 4. The right coronary artery has an ostial diffuse proximal 50% stenosis.    There was damping of the pressure waves when engaging  the JL4 catheter.    Normal dye efflux was observed.  There was further vessel diameter    increased with intracoronary nitroglycerin.  The remainder of the    right coronary artery at the level of the mid vessel with 30-40%    diffuse stenosis at the level of an RV branch.  CONCLUSIONS AND RECOMMENDATIONS:  The patient appears to have single-vessel coronary artery disease.  Start medical therapy as indicated.  Aspirin, beta blocker, and ACE inhibitor can be continued.  A statin should be started lipid ______ check in six weeks.  Echo 08/24/17 Study Conclusions  - Left ventricle: The cavity size was normal. There was moderate   concentric hypertrophy. Systolic function was mildly to   moderately reduced. The estimated ejection fraction was in the   range of 40% to 45%. Hypokinesis of all mid-apical segments.   Apical akinesis. - Aortic valve: Transvalvular velocity was within the normal range.   There was no stenosis. There was no regurgitation. - Mitral valve: Mild thickening and calcification. Transvalvular   velocity was within the normal range. There was no evidence for   stenosis. There was trivial regurgitation. - Right ventricle: The cavity size was normal. Wall thickness was   normal. - Tricuspid valve: There was trivial regurgitation. - Pulmonary arteries: Systolic pressure was moderately increased.   PA peak pressure: 51 mm Hg (S). Laboratory Data:  Chemistry Recent Labs Lab 08/24/17 0129 08/25/17 0504  NA 136 139  K 4.4 4.4  CL 105 107  CO2 22 25  GLUCOSE 443* 148*  BUN 23* 28*    CREATININE 1.73* 2.05*  CALCIUM 8.7* 8.8*  GFRNONAA 28* 23*  GFRAA 32* 26*  ANIONGAP 9 7    No results for input(s): PROT, ALBUMIN, AST, ALT, ALKPHOS, BILITOT in the last 168 hours. Hematology Recent Labs Lab 08/24/17 0129 08/25/17 0504  WBC 7.0 5.8  RBC 2.91* 2.96*  HGB 9.1* 8.9*  HCT 28.2* 28.7*  MCV 96.9 97.0  MCH 31.3 30.1  MCHC 32.3 31.0  RDW 14.4 14.6  PLT 101* 141*   Cardiac Enzymes Recent Labs Lab 08/24/17 0810 08/24/17 1049 08/24/17 1829 08/24/17 2242  TROPONINI 0.34* 0.33* 0.40* 0.47*    Recent Labs Lab 08/24/17 0145  TROPIPOC 0.01    BNP Recent Labs Lab 08/24/17 0130  BNP 357.9*    DDimer No results for input(s): DDIMER in the last 168 hours.  Radiology/Studies:  Dg Chest 2 View  Result Date: 08/24/2017 CLINICAL DATA:  Shortness of breath EXAM: CHEST  2 VIEW COMPARISON:  03/27/2017 FINDINGS: Mild cardiac enlargement. No pulmonary vascular congestion or edema. Mild blunting of costophrenic angles suggesting small effusions. No pneumothorax. Mediastinal contours appear intact. Calcification of the aorta. Power port type central venous catheter on the right with tip over the low SVC region. Degenerative changes in the shoulders. IMPRESSION: Cardiac enlargement. Blunting of the costophrenic angles suggesting small effusions. No edema or consolidation. Electronically Signed   By: Lucienne Capers M.D.   On: 08/24/2017 01:40    Assessment and Plan:   1. Acute systolic CHF - Echo showed LVEF of 40-45% (was 60-65 in 08/2016) with apical hypokinesis and moderate pHTN. BNP 357.9.  - NET I & O +10cc. Patient states she is going to bathroom a lot. Doubt accurate. Has 1+ LE edema bilaterally.  - Given newly reduce LVEF, she will need ischemic evaluation. However, Scr has worsen with diuresis. She does not have nephrologist. She is risk of contrast induced nephropathy. Continue  BB and ACE.  Reduce IV lasix to 40mg  qd.   2. Acute on CKD, stage IV - as above.  Follow closely with diuresis. Baseline creatinine 1.6-1.7.   3. Elevated TSH - pending free t4. Per primary team  4. HTN - BP was 155/86 at presentation. Now stable.   5. Anemia and thrombocytopenia - Per primary team.   For questions or updates, please contact [place] Please consult [email or web address] for contact info under Cardiology/STEMI.   Jarrett Soho, [place]  08/25/2017 1:34 PM   Attending Note:   The patient was seen and examined.  Agree with assessment and plan as noted above.  Changes made to the above note as needed.  Patient seen and independently examined with Vin bhagat, PA .   We discussed all aspects of the encounter. I agree with the assessment and plan as stated above.  1. Coronary artery disease/non-ST segment elevation myocardial infarction: Ruth presents with severe shortness of breath and chest burning. Her troponins are positive and are gradually increasing.  Last troponin is 0.47. Her echocardiogram shows a moderately depressed left ventricle systolic function ( EF 49-70%)  with apical akinesis. I suspect that she has a mid/ apical LAD stenosis.    She also has acute on chronic kidney disease with a creatinine of 2.05.  She has diabetes mellitus  We discussed the fact that she would typically need a cardiac catheterization for further evaluation of her coronary artery disease and LV wall motion abnormalities. We discussed the fact that this might lead to renal failure and dialysis. She has had dialysis before when she was started on chemotherapy and does not want to have dialysis again.  She would not completely rule out starting dialysis if it was a life-threatening issue.  At this point we'll reduce her Lasix to 40 mg a day and allow her creatinine to improve slightly. Continue with maximal medical therapy including beta blockers ,  hydralazine, nitrates. One approach would be to continue continue with maximal medical therapy and hold off on doing  a heart catheterization for now. We would reserve heart catheterization if she failed medical therapy and had recurrent episodes of chest discomfort / NSTEMI.    If her creatinine improves back to baseline from earlier this year, I think we could safely do a limited contrast heart cath. If she's found have significant blockage we would consider staging  the PCI for another day, again minimizing the risk for contrast-induced nephropathy.    I have spent a total of 40 minutes with patient reviewing hospital  notes , telemetry, EKGs, labs and examining patient as well as establishing an assessment and plan that was discussed with the patient. > 50% of time was spent in direct patient care.    Thayer Headings, Brooke Bonito., MD, [facility] 08/25/2017, 3:14 PM [address]  [place] Pager [phone]

## 2017-08-26 DIAGNOSIS — I5041 Acute combined systolic (congestive) and diastolic (congestive) heart failure: Secondary | ICD-10-CM

## 2017-08-26 LAB — CBC
HEMATOCRIT: 29.3 % — AB (ref 36.0–46.0)
HEMOGLOBIN: 9 g/dL — AB (ref 12.0–15.0)
MCH: 29.6 pg (ref 26.0–34.0)
MCHC: 30.7 g/dL (ref 30.0–36.0)
MCV: 96.4 fL (ref 78.0–100.0)
Platelets: 153 10*3/uL (ref 150–400)
RBC: 3.04 MIL/uL — ABNORMAL LOW (ref 3.87–5.11)
RDW: 14.3 % (ref 11.5–15.5)
WBC: 5.4 10*3/uL (ref 4.0–10.5)

## 2017-08-26 LAB — BASIC METABOLIC PANEL
ANION GAP: 7 (ref 5–15)
BUN: 36 mg/dL — AB (ref 6–20)
CHLORIDE: 106 mmol/L (ref 101–111)
CO2: 23 mmol/L (ref 22–32)
Calcium: 8.7 mg/dL — ABNORMAL LOW (ref 8.9–10.3)
Creatinine, Ser: 2.32 mg/dL — ABNORMAL HIGH (ref 0.44–1.00)
GFR calc Af Amer: 23 mL/min — ABNORMAL LOW (ref >60)
GFR, EST NON AFRICAN AMERICAN: 19 mL/min — AB (ref >60)
Glucose, Bld: 170 mg/dL — ABNORMAL HIGH (ref 65–99)
POTASSIUM: 5.2 mmol/L — AB (ref 3.5–5.1)
Sodium: 136 mmol/L (ref 135–145)

## 2017-08-26 LAB — GLUCOSE, CAPILLARY
GLUCOSE-CAPILLARY: 106 mg/dL — AB (ref 65–99)
GLUCOSE-CAPILLARY: 75 mg/dL (ref 65–99)
Glucose-Capillary: 217 mg/dL — ABNORMAL HIGH (ref 65–99)
Glucose-Capillary: 241 mg/dL — ABNORMAL HIGH (ref 65–99)
Glucose-Capillary: 156 mg/dL — ABNORMAL HIGH (ref 65–99)

## 2017-08-26 LAB — CULTURE, BLOOD (ROUTINE X 2): Special Requests: ADEQUATE

## 2017-08-26 MED ORDER — INSULIN GLARGINE 100 UNIT/ML ~~LOC~~ SOLN
15.0000 [IU] | Freq: Every day | SUBCUTANEOUS | Status: DC
  Administered 2017-08-26 – 2017-08-27 (×2): 15 [IU] via SUBCUTANEOUS
  Filled 2017-08-26 (×3): qty 0.15

## 2017-08-26 MED ORDER — FUROSEMIDE 40 MG PO TABS
40.0000 mg | ORAL_TABLET | Freq: Every day | ORAL | Status: DC
  Administered 2017-08-27 – 2017-08-28 (×2): 40 mg via ORAL
  Filled 2017-08-26 (×2): qty 1

## 2017-08-26 NOTE — Care Management Note (Signed)
Case Management Note  Patient Details  Name: [name] MRN: [id number] Date of Birth: 1941-09-04  Subjective/Objective: CHF                  Action/Plan: Patient lives with spouse; PCP: Marletta Lor, MD; has private insurance with [facility] Medicare with prescription drug coverage; CM following for DCP;   Expected Discharge Date:   possibly 08/30/2017               Expected Discharge Plan: possibly [place] depending on PT eval  In-House Referral:   [facility]  Discharge planning Services  CM Consult     Status of Service:  In process, will continue to follow  Sherrilyn Rist [phone] 08/26/2017, 10:01 AM

## 2017-08-26 NOTE — Progress Notes (Signed)
Progress Note  Patient Name: [name] Date of Encounter: 08/26/2017  Primary Cardiologist: new to [facility]   Subjective   [name] is a 76 y.o. female with a hx of non obstructive CAD, HTN, HLD, CKD stage III-IV with baseline Scr of 1.7, AML in remission (tx with Doxurubicin), COPD and chronic anemia who is being seen today for the evaluation of elevated troponin at the request of Dr. Eliseo Squires.   We decreased her Lasix dose yesterday. Her creatinine continues to climb. She is breathing a little bit better.  Inpatient Medications    Scheduled Meds: . amLODipine  5 mg Oral Daily  . aspirin EC  325 mg Oral Daily  . furosemide  40 mg Intravenous Daily  . insulin aspart  0-20 Units Subcutaneous TID WC  . insulin aspart  0-5 Units Subcutaneous QHS  . insulin aspart  4 Units Subcutaneous TID WC  . insulin glargine  20 Units Subcutaneous QHS  . lisinopril  10 mg Oral Daily  . metoprolol tartrate  50 mg Oral BID  . potassium chloride  20 mEq Oral BID  . simvastatin  20 mg Oral QPM   Continuous Infusions:  PRN Meds: acetaminophen **OR** acetaminophen, HYDROcodone-acetaminophen, LORazepam, ondansetron **OR** ondansetron (ZOFRAN) IV   Vital Signs    Vitals:   08/25/17 2012 08/26/17 0107 08/26/17 0543 08/26/17 0803  BP: (!) 115/48 (!) 146/66 128/64 111/80  Pulse: 76 69 75 82  Resp: 20 20 20 18   Temp: (!) 97.5 F (36.4 C) 97.6 F (36.4 C) 98.1 F (36.7 C) 98.2 F (36.8 C)  TempSrc: Oral Oral Oral Oral  SpO2: 100% 100% 100% 94%  Weight:   235 lb 12.8 oz (107 kg)   Height:        Intake/Output Summary (Last 24 hours) at 08/26/17 1045 Last data filed at 08/26/17 0930  Gross per 24 hour  Intake              820 ml  Output             1050 ml  Net             -230 ml   Filed Weights   08/24/17 1811 08/25/17 0518 08/26/17 0543  Weight: 234 lb 6.4 oz (106.3 kg) 234 lb (106.1 kg) 235 lb 12.8 oz (107 kg)    Telemetry    NSR  - Personally Reviewed  ECG     NSR  -  Personally Reviewed  Physical Exam   XLK:[id number] female. She is morbidly obese. She is in no acute distress. Neck: No JVD Cardiac: RRR, no murmurs, rubs, or gallops.  Respiratory: Clear to auscultation , breath sounds are distant. GI:  obese, Soft, nontender, non-distended  MS:  1+ pitting edema bilaterally. Neuro:  Nonfocal  Psych: Normal affect   Labs    Chemistry Recent Labs Lab 08/24/17 0129 08/25/17 0504 08/26/17 0422  NA 136 139 136  K 4.4 4.4 5.2*  CL 105 107 106  CO2 22 25 23   GLUCOSE 443* 148* 170*  BUN 23* 28* 36*  CREATININE 1.73* 2.05* 2.32*  CALCIUM 8.7* 8.8* 8.7*  GFRNONAA 28* 23* 19*  GFRAA 32* 26* 23*  ANIONGAP 9 7 7      Hematology Recent Labs Lab 08/24/17 0129 08/25/17 0504 08/26/17 0422  WBC 7.0 5.8 5.4  RBC 2.91* 2.96* 3.04*  HGB 9.1* 8.9* 9.0*  HCT 28.2* 28.7* 29.3*  MCV 96.9 97.0 96.4  MCH 31.3 30.1 29.6  MCHC 32.3 31.0 30.7  RDW 14.4 14.6 14.3  PLT 101* 141* 153    Cardiac Enzymes Recent Labs Lab 08/24/17 0810 08/24/17 1049 08/24/17 1829 08/24/17 2242  TROPONINI 0.34* 0.33* 0.40* 0.47*    Recent Labs Lab 08/24/17 0145  TROPIPOC 0.01     BNP Recent Labs Lab 08/24/17 0130  BNP 357.9*     DDimer No results for input(s): DDIMER in the last 168 hours.   Radiology    No results found.  Cardiac Studies     Patient Profile     76 y.o. female with known mild coronary artery disease, morbid obesity, diabetes mellitus. She was admitted with progressive shortness of breath. Troponin levels are consistent with a non-ST segment elevation myocardial infarction.   Assessment & Plan    1. Non-ST segment elevation myocardial infarction: The patient seems to be feeling a little bit better. She has apical akinesis which is suggestive of a mid/distal LAD occlusion.  She has stage 3-4 chronic kidney disease and does not want to go on dialysis. We've tried to treat her medically. Since that time she's done well and has not had  any recurrent chest pain. I would continue with medical therapy. I would reserve heart catheter station if she fails medical therapy.  2. Chronic combined systolic and diastolic congestive heart failure:   I have personally reviewed the echo.   It is very difficult to assess diastolic function but I think there is grade 2 diastolic dysfunction  Continue with Lasix at a lower dose given her renal insufficiency. She desperately needs to lose weight.  3. Essential hypertension: Continue metoprolol, amlodipine, lisinopril  4. CKD:   Further management per IM   5. DM :  Further management per IM   For questions or updates, please contact [place] Please consult [email or web address] for contact info under Cardiology/STEMI.      Signed, Mertie Moores, MD  08/26/2017, 10:45 AM

## 2017-08-26 NOTE — Progress Notes (Signed)
PROGRESS NOTE    [name]  QZE:[id number] DOB: Jun 27, 1941 DOA: 08/24/2017 PCP: Marletta Lor, MD   Outpatient Specialists:     Brief Narrative:  [name] is a 76 y.o. female with a past medical history significant for AML in remission since 2015, treated with doxurubicin, CKD III-IV baseline Cr 1.7, chronic anemia, and moderate COPD who presents with chest discomfort.  The patient was in her usual state of health (very limited mobility since her chemotherapy, widespread pain, he relates only short distances), until about the last month. Over the last several weeks, she has started to noticed leg swelling. She has not been able to notice exertional dyspnea, however she can only walk from one room to the other at baseline. Then in the last 24 hours, she has had burning chest discomfort with inspiration, worse with exertion, new orthopnea, and for the last several nights, she has been waking up with chest discomfort, relieved with sitting up and taking nitroglycerin.  Found to have an EF of 40-45% with hypokinesis.  Cath being held in favor of medical management.  Assessment & Plan:   Principal Problem:   Acute CHF (congestive heart failure) (HCC) Active Problems:   Diabetes mellitus with renal complications (HCC)   Essential hypertension   Back pain   CKD (chronic kidney disease) stage 3, GFR 30-59 ml/min   AML (acute myeloblastic leukemia) (HCC)   Other pancytopenia (HCC)   COPD (chronic obstructive pulmonary disease) (HCC)   Acute systolic/diastolic CHF - EF 22-63% with hypokinesis / grade 2 diastolic CHF -troponins up -lasix per cards- decreased to 40 mg PO daily -Strict I/Os, daily weights, telemetry  -Daily monitoring renal function -Echocardiogram: Left ventricle: The cavity size was normal. There was moderate   concentric hypertrophy. Systolic function was mildly to   moderately reduced. The estimated ejection fraction was in the   range of 40% to 45%.  Hypokinesis of all mid-apical segments.   Apical akinesis. -cardiology consult: "apical akinesis which is suggestive of a mid/distal LAD occlusion.  She has stage 3-4 chronic kidney disease and does not want to go on dialysis. We've tried to treat her medically. Since that time she's done well and has not had any recurrent chest pain. I would continue with medical therapy. I would reserve heart catheter station if she fails medical therapy." -TED hose for LE edema  COPD:  No clear wheezing, favor that this is all CHF.  Hypertension:  Hypertensive at admission. -Continue lisinopril, metoprolol, amlodipine -Continue aspirin, statin  Elevated TSH -free t4 pending  Diabetes:  Hyperglycemic at admission. -Continue long-acting insulin -Mealtime and SSI ordered -Hold metformin -Stop Actos given CHF  CKD IV:  Baseline Cr 1.7, eGFR 28. -Monitor renal function daily -hold lisinopril for increasing Cr -decrease lasix to PO    Anemia and thrombocytopenia:  Slightly down from baseline.  Just saw her Oncologist in the last few weeks.  1/2 blood cultures-- + for gram + cocci -await final results- suspect contaminant    DVT prophylaxis:  SCD's  Code Status: Full Code   Family Communication: At bedside  Disposition Plan:  Home 1-2 days   Consultants:   cards  Procedures:  echo  Subjective: No overnight events  Objective: Vitals:   08/26/17 0107 08/26/17 0543 08/26/17 0803 08/26/17 1206  BP: (!) 146/66 128/64 111/80 (!) 118/43  Pulse: 69 75 82 68  Resp: '20 20 18 18  '$ Temp: 97.6 F (36.4 C) 98.1 F (36.7 C) 98.2 F (  36.8 C) 98.3 F (36.8 C)  TempSrc: Oral Oral Oral Oral  SpO2: 100% 100% 94% 94%  Weight:  107 kg (235 lb 12.8 oz)    Height:        Intake/Output Summary (Last 24 hours) at 08/26/17 1446 Last data filed at 08/26/17 1339  Gross per 24 hour  Intake              700 ml  Output             1000 ml  Net             -300 ml   Filed  Weights   08/24/17 1811 08/25/17 0518 08/26/17 0543  Weight: 106.3 kg (234 lb 6.4 oz) 106.1 kg (234 lb) 107 kg (235 lb 12.8 oz)    Examination:  General exam: on bedside commode Respiratory system: no weezing Cardiovascular system: +LE edema, rrr Gastrointestinal system: +Bs, soft Central nervous system: alert Psychiatry: flat affect    Data Reviewed: I have personally reviewed following labs and imaging studies  CBC:  Recent Labs Lab 08/24/17 0129 08/24/17 0130 08/25/17 0504 08/26/17 0422  WBC 7.0  --  5.8 5.4  NEUTROABS  --  5.0  --   --   HGB 9.1*  --  8.9* 9.0*  HCT 28.2*  --  28.7* 29.3*  MCV 96.9  --  97.0 96.4  PLT 101*  --  141* 892   Basic Metabolic Panel:  Recent Labs Lab 08/24/17 0129 08/25/17 0504 08/26/17 0422  NA 136 139 136  K 4.4 4.4 5.2*  CL 105 107 106  CO2 '22 25 23  '$ GLUCOSE 443* 148* 170*  BUN 23* 28* 36*  CREATININE 1.73* 2.05* 2.32*  CALCIUM 8.7* 8.8* 8.7*   GFR: Estimated Creatinine Clearance: 24.1 mL/min (A) (by C-G formula based on SCr of 2.32 mg/dL (H)). Liver Function Tests: No results for input(s): AST, ALT, ALKPHOS, BILITOT, PROT, ALBUMIN in the last 168 hours. No results for input(s): LIPASE, AMYLASE in the last 168 hours. No results for input(s): AMMONIA in the last 168 hours. Coagulation Profile:  Recent Labs Lab 08/24/17 0310  INR 1.05   Cardiac Enzymes:  Recent Labs Lab 08/24/17 0810 08/24/17 1049 08/24/17 1829 08/24/17 2242  TROPONINI 0.34* 0.33* 0.40* 0.47*   BNP (last 3 results) No results for input(s): PROBNP in the last 8760 hours. HbA1C: No results for input(s): HGBA1C in the last 72 hours. CBG:  Recent Labs Lab 08/25/17 1728 08/25/17 1808 08/25/17 2117 08/26/17 0737 08/26/17 1104  GLUCAP 103* 140* 215* 217* 106*   Lipid Profile: No results for input(s): CHOL, HDL, LDLCALC, TRIG, CHOLHDL, LDLDIRECT in the last 72 hours. Thyroid Function Tests:  Recent Labs  08/24/17 0800 08/25/17 1428    TSH 6.197*  --   FREET4  --  1.06   Anemia Panel: No results for input(s): VITAMINB12, FOLATE, FERRITIN, TIBC, IRON, RETICCTPCT in the last 72 hours. Urine analysis:    Component Value Date/Time   COLORURINE STRAW (A) 08/24/2017 0247   APPEARANCEUR CLEAR 08/24/2017 0247   LABSPEC 1.009 08/24/2017 0247   LABSPEC 1.015 08/04/2014 1439   PHURINE 5.0 08/24/2017 0247   GLUCOSEU >=500 (A) 08/24/2017 0247   GLUCOSEU Negative 08/04/2014 1439   HGBUR NEGATIVE 08/24/2017 0247   HGBUR negative 10/10/2010 1607   BILIRUBINUR NEGATIVE 08/24/2017 0247   BILIRUBINUR Negative 08/04/2014 1439   KETONESUR NEGATIVE 08/24/2017 0247   PROTEINUR NEGATIVE 08/24/2017 0247   UROBILINOGEN 0.2 08/04/2014 1439  NITRITE NEGATIVE 08/24/2017 0247   LEUKOCYTESUR NEGATIVE 08/24/2017 0247   LEUKOCYTESUR Negative 08/04/2014 1439     ) Recent Results (from the past 240 hour(s))  Culture, blood (Routine x 2)     Status: Abnormal   Collection Time: 08/24/17  2:47 AM  Result Value Ref Range Status   Specimen Description BLOOD LEFT HAND  Final   Special Requests   Final    BOTTLES DRAWN AEROBIC AND ANAEROBIC Blood Culture adequate volume   Culture  Setup Time   Final    GRAM POSITIVE COCCI IN CHAINS IN BOTH AEROBIC AND ANAEROBIC BOTTLES CRITICAL RESULT CALLED TO, READ BACK BY AND VERIFIED WITH: Patrick North PHARMD 2100 08/24/17 A BROWNING    Culture STREPTOCOCCUS MITIS/ORALIS (A)  Final   Report Status 08/26/2017 FINAL  Final   Organism ID, Bacteria STREPTOCOCCUS MITIS/ORALIS  Final      Susceptibility   Streptococcus mitis/oralis - MIC*    ERYTHROMYCIN 2 RESISTANT Resistant     TETRACYCLINE >=16 RESISTANT Resistant     VANCOMYCIN 0.5 SENSITIVE Sensitive     CLINDAMYCIN <=0.25 SENSITIVE Sensitive     * STREPTOCOCCUS MITIS/ORALIS  Blood Culture ID Panel (Reflexed)     Status: Abnormal   Collection Time: 08/24/17  2:47 AM  Result Value Ref Range Status   Enterococcus species NOT DETECTED NOT DETECTED  Final   Vancomycin resistance NOT DETECTED NOT DETECTED Final   Listeria monocytogenes NOT DETECTED NOT DETECTED Final   Staphylococcus species NOT DETECTED NOT DETECTED Final   Staphylococcus aureus NOT DETECTED NOT DETECTED Final   Methicillin resistance NOT DETECTED NOT DETECTED Final   Streptococcus species DETECTED (A) NOT DETECTED Final    Comment: Not Enterococcus species, Streptococcus agalactiae, Streptococcus pyogenes, or Streptococcus pneumoniae. CRITICAL RESULT CALLED TO, READ BACK BY AND VERIFIED WITH: Patrick North PHARMD 2100 08/24/17 A BROWNING    Streptococcus agalactiae NOT DETECTED NOT DETECTED Final   Streptococcus pneumoniae NOT DETECTED NOT DETECTED Final   Streptococcus pyogenes NOT DETECTED NOT DETECTED Final   Acinetobacter baumannii NOT DETECTED NOT DETECTED Final   Enterobacteriaceae species NOT DETECTED NOT DETECTED Final   Enterobacter cloacae complex NOT DETECTED NOT DETECTED Final   Escherichia coli NOT DETECTED NOT DETECTED Final   Klebsiella oxytoca NOT DETECTED NOT DETECTED Final   Klebsiella pneumoniae NOT DETECTED NOT DETECTED Final   Proteus species NOT DETECTED NOT DETECTED Final   Serratia marcescens NOT DETECTED NOT DETECTED Final   Carbapenem resistance NOT DETECTED NOT DETECTED Final   Haemophilus influenzae NOT DETECTED NOT DETECTED Final   Neisseria meningitidis NOT DETECTED NOT DETECTED Final   Pseudomonas aeruginosa NOT DETECTED NOT DETECTED Final   Candida albicans NOT DETECTED NOT DETECTED Final   Candida glabrata NOT DETECTED NOT DETECTED Final   Candida krusei NOT DETECTED NOT DETECTED Final   Candida parapsilosis NOT DETECTED NOT DETECTED Final   Candida tropicalis NOT DETECTED NOT DETECTED Final  Culture, blood (Routine x 2)     Status: None (Preliminary result)   Collection Time: 08/24/17  3:10 AM  Result Value Ref Range Status   Specimen Description BLOOD RIGHT ANTECUBITAL  Final   Special Requests   Final    BOTTLES DRAWN  AEROBIC ONLY Blood Culture adequate volume   Culture NO GROWTH 1 DAY  Final   Report Status PENDING  Incomplete      Anti-infectives    None       Radiology Studies: No results found.      Scheduled  Meds: . amLODipine  5 mg Oral Daily  . aspirin EC  325 mg Oral Daily  . furosemide  40 mg Intravenous Daily  . insulin aspart  0-20 Units Subcutaneous TID WC  . insulin aspart  0-5 Units Subcutaneous QHS  . insulin aspart  4 Units Subcutaneous TID WC  . insulin glargine  20 Units Subcutaneous QHS  . metoprolol tartrate  50 mg Oral BID  . simvastatin  20 mg Oral QPM   Continuous Infusions:   LOS: 1 day    Time spent:35 min    JESSICA U VANN, DO [facility] Pager [phone]  If 7PM-7AM, please contact night-coverage [email or web address] Password [email or web address] 08/26/2017, 2:46 PM

## 2017-08-27 DIAGNOSIS — C9201 Acute myeloblastic leukemia, in remission: Secondary | ICD-10-CM

## 2017-08-27 DIAGNOSIS — I10 Essential (primary) hypertension: Secondary | ICD-10-CM

## 2017-08-27 DIAGNOSIS — Z794 Long term (current) use of insulin: Secondary | ICD-10-CM

## 2017-08-27 DIAGNOSIS — J449 Chronic obstructive pulmonary disease, unspecified: Secondary | ICD-10-CM

## 2017-08-27 DIAGNOSIS — E0821 Diabetes mellitus due to underlying condition with diabetic nephropathy: Secondary | ICD-10-CM

## 2017-08-27 DIAGNOSIS — I5043 Acute on chronic combined systolic (congestive) and diastolic (congestive) heart failure: Secondary | ICD-10-CM

## 2017-08-27 LAB — BASIC METABOLIC PANEL
Anion gap: 9 (ref 5–15)
BUN: 43 mg/dL — ABNORMAL HIGH (ref 6–20)
CHLORIDE: 108 mmol/L (ref 101–111)
CO2: 19 mmol/L — ABNORMAL LOW (ref 22–32)
CREATININE: 2.31 mg/dL — AB (ref 0.44–1.00)
Calcium: 8.3 mg/dL — ABNORMAL LOW (ref 8.9–10.3)
GFR, EST AFRICAN AMERICAN: 23 mL/min — AB (ref >60)
GFR, EST NON AFRICAN AMERICAN: 20 mL/min — AB (ref >60)
Glucose, Bld: 200 mg/dL — ABNORMAL HIGH (ref 65–99)
POTASSIUM: 5.6 mmol/L — AB (ref 3.5–5.1)
SODIUM: 136 mmol/L (ref 135–145)

## 2017-08-27 LAB — GLUCOSE, CAPILLARY
GLUCOSE-CAPILLARY: 192 mg/dL — AB (ref 65–99)
GLUCOSE-CAPILLARY: 114 mg/dL — AB (ref 65–99)
Glucose-Capillary: 224 mg/dL — ABNORMAL HIGH (ref 65–99)
Glucose-Capillary: 120 mg/dL — ABNORMAL HIGH (ref 65–99)

## 2017-08-27 LAB — CBC
HEMATOCRIT: 28 % — AB (ref 36.0–46.0)
Hemoglobin: 8.7 g/dL — ABNORMAL LOW (ref 12.0–15.0)
MCH: 30.2 pg (ref 26.0–34.0)
MCHC: 31.1 g/dL (ref 30.0–36.0)
MCV: 97.2 fL (ref 78.0–100.0)
PLATELETS: 151 10*3/uL (ref 150–400)
RBC: 2.88 MIL/uL — AB (ref 3.87–5.11)
RDW: 14.5 % (ref 11.5–15.5)
WBC: 5.5 10*3/uL (ref 4.0–10.5)

## 2017-08-27 MED ORDER — ASPIRIN 81 MG PO CHEW
81.0000 mg | CHEWABLE_TABLET | Freq: Every day | ORAL | Status: DC
  Administered 2017-08-28: 81 mg via ORAL
  Filled 2017-08-27 (×2): qty 1

## 2017-08-27 MED ORDER — ISOSORBIDE MONONITRATE ER 30 MG PO TB24
30.0000 mg | ORAL_TABLET | Freq: Every day | ORAL | Status: DC
  Administered 2017-08-27 – 2017-08-28 (×2): 30 mg via ORAL
  Filled 2017-08-27 (×2): qty 1

## 2017-08-27 NOTE — Progress Notes (Signed)
PROGRESS NOTE    [name]  HWT:[id number] DOB: 02/15/1941 DOA: 08/24/2017 PCP: Marletta Lor, MD   Outpatient Specialists:     Brief Narrative:  [name] is a 76 y.o. female with a past medical history significant for AML in remission since 2015, treated with doxurubicin, CKD III-IV baseline Cr 1.7, chronic anemia, and moderate COPD who presents with chest discomfort.  The patient was in her usual state of health (very limited mobility since her chemotherapy, widespread pain, he relates only short distances), until about the last month. Over the last several weeks, she has started to noticed leg swelling. She has not been able to notice exertional dyspnea, however she can only walk from one room to the other at baseline. Then in the last 24 hours, she has had burning chest discomfort with inspiration, worse with exertion, new orthopnea, and for the last several nights, she has been waking up with chest discomfort, relieved with sitting up and taking nitroglycerin.  Found to have an EF of 40-45% with hypokinesis.  Cath being held in favor of medical management.  Assessment & Plan:   Principal Problem:   Acute CHF (congestive heart failure) (HCC) Active Problems:   Diabetes mellitus with renal complications (HCC)   Essential hypertension   Back pain   CKD (chronic kidney disease) stage 3, GFR 30-59 ml/min   AML (acute myeloblastic leukemia) (HCC)   Other pancytopenia (HCC)   COPD (chronic obstructive pulmonary disease) (HCC)   Acute systolic/diastolic CHF/NSTEMI - EF 91-79% with hypokinesis / grade 2 diastolic CHF -troponins up -no CP -following cardiology rec's, no candidate for cath given CKD and CP free. Will continue medical management -cardiology consult recommendations: "apical akinesis which is suggestive of a mid/distal LAD occlusion.  She has stage 3-4 chronic kidney disease and does not want to go on dialysis. We've tried to treat her medically. Since that  time she's done well and has not had any recurrent chest pain. I would continue with medical therapy. I would reserve heart catheter station if she fails medical therapy." -started on Imdur and continue PO lasix -continue to follow daily weights, Strict I's and O's -close follow up of renal function -continue TED hose for LE edema  COPD:  -patient with Some SOB -No clear wheezing, favor that this is all CHF. -continue PRN duoneb  HLD -continue statins   Hyperkalemia -will follow electrolytes -stop potassium supplementation -K 5.6   Hypertension:  -continue metoprolol and amlodipine -lasix also helping with BP control. -Continue aspirin, statin  Elevated TSH -TSH 6.197 -normal free T4 -will recommend repeat thyroid panel in 3 weeks and start treatment if needed.  Diabetes:  -Hyperglycemic at admission. -Continue long-acting insulin and SSI -will hold metformin in setting of CKD with worsening Cr and inpatient status -due to CHF will stop Actos.  CKD IV:  -Baseline Cr 1.7, eGFR 28. -Monitor renal function daily, especially with diuresis -continue hold lisinopril in setting of increasing Cr from baseline  -continue lasix by mouth   Anemia and thrombocytopenia:  -Slightly down from baseline.  Just saw her Oncologist in the last few weeks. -no active bleeding -will monitor trend   1/2 blood cultures-- + for gram + cocci -streptococcus mitis/oralis -sensitive to vancomycin and cleocin -will discussed with ID if treatment needed or considered to be contaminant -no fever, and non toxic.  DVT prophylaxis:  SCD's  Code Status: Full Code   Family Communication: No family at bedside   Disposition Plan:  Home when medically stable. Per discussion with cardiology, no cath to be pursuit, continue medical management. Transition diuretics to PO and observe urine output.   Consultants:   Cardiology   Procedures:   2-D echo - Left ventricle: The cavity size  was normal. There was moderate   concentric hypertrophy. Systolic function was mildly to   moderately reduced. The estimated ejection fraction was in the   range of 40% to 45%. Hypokinesis of all mid-apical segments.   Apical akinesis. - Aortic valve: Transvalvular velocity was within the normal range.   There was no stenosis. There was no regurgitation. - Mitral valve: Mild thickening and calcification. Transvalvular   velocity was within the normal range. There was no evidence for   stenosis. There was trivial regurgitation. - Right ventricle: The cavity size was normal. Wall thickness was   normal. - Tricuspid valve: There was trivial regurgitation. - Pulmonary arteries: Systolic pressure was moderately increased.   PA peak pressure: 51 mm Hg (S).  Subjective: No CP, no fever, no nausea, no vomiting. Breathing better and in no distress.  Objective: Vitals:   08/27/17 0501 08/27/17 0930 08/27/17 1202 08/27/17 1940  BP: (!) 136/55 (!) 120/53 102/68 (!) 114/43  Pulse: 81 73 62 66  Resp: 20 (!) _0 Temp: 97.7 F (36.5 C)  98.4 F (36.9 C) 98.6 F (37 C)  TempSrc: Oral  Oral Oral  SpO2: 93% 97% 93% 93%  Weight: 106.8 kg (235 lb 6.4 oz)     Height:        Intake/Output Summary (Last 24 hours) at 08/27/17 2138 Last data filed at 08/27/17 1842  Gross per 24 hour  Intake             1200 ml  Output              600 ml  Net              600 ml   Filed Weights   08/25/17 0518 08/26/17 0543 08/27/17 0501  Weight: 106.1 kg (234 lb) 107 kg (235 lb 12.8 oz) 106.8 kg (235 lb 6.4 oz)    Examination:  General exam: sitting up on bed, no fever, no CP, no nausea or vomiting. Feeling better and reporting improvement in her breathing. Respiratory system: no wheezing, no frank crackles, improved air movement  Cardiovascular system: rrr, no rubs, no gallops Gastrointestinal system: positive BS, no guarding, no distension  Central nervous system: AAOX3, CN intact, no focal motor  deficit  Psychiatry: flat affect, no hallucinations, no SI    Data Reviewed: I have personally reviewed following labs and imaging studies  CBC:  Recent Labs Lab 08/24/17 0129 08/24/17 0130 08/25/17 0504 08/26/17 0422 08/27/17 0225  WBC 7.0  --  5.8 5.4 5.5  NEUTROABS  --  5.0  --   --   --   HGB 9.1*  --  8.9* 9.0* 8.7*  HCT 28.2*  --  28.7* 29.3* 28.0*  MCV 96.9  --  97.0 96.4 97.2  PLT 101*  --  141* 153 580   Basic Metabolic Panel:  Recent Labs Lab 08/24/17 0129 08/25/17 0504 08/26/17 0422 08/27/17 0225  NA 136 139 136 136  K 4.4 4.4 5.2* 5.6*  CL 105 107 106 108  CO2 _1 19*  GLUCOSE 443* 148* 170* 200*  BUN 23* 28* 36* 43*  CREATININE 1.73* 2.05* 2.32* 2.31*  CALCIUM 8.7* 8.8* 8.7* 8.3*   GFR: Estimated  Creatinine Clearance: 24.2 mL/min (A) (by C-G formula based on SCr of 2.31 mg/dL (H)).  Coagulation Profile:  Recent Labs Lab 08/24/17 0310  INR 1.05   Cardiac Enzymes:  Recent Labs Lab 08/24/17 0810 08/24/17 1049 08/24/17 1829 08/24/17 2242  TROPONINI 0.34* 0.33* 0.40* 0.47*   CBG:  Recent Labs Lab 08/26/17 2116 08/26/17 2321 08/27/17 0742 08/27/17 1207 08/27/17 1629  GLUCAP 75 156* 224* 192* 120*   Thyroid Function Tests:  Recent Labs  08/25/17 1428  FREET4 1.06   Urine analysis:    Component Value Date/Time   COLORURINE STRAW (A) 08/24/2017 0247   APPEARANCEUR CLEAR 08/24/2017 0247   LABSPEC 1.009 08/24/2017 0247   LABSPEC 1.015 08/04/2014 1439   PHURINE 5.0 08/24/2017 0247   GLUCOSEU >=500 (A) 08/24/2017 0247   GLUCOSEU Negative 08/04/2014 1439   HGBUR NEGATIVE 08/24/2017 0247   HGBUR negative 10/10/2010 1607   BILIRUBINUR NEGATIVE 08/24/2017 0247   BILIRUBINUR Negative 08/04/2014 1439   KETONESUR NEGATIVE 08/24/2017 0247   PROTEINUR NEGATIVE 08/24/2017 0247   UROBILINOGEN 0.2 08/04/2014 1439   NITRITE NEGATIVE 08/24/2017 0247   LEUKOCYTESUR NEGATIVE 08/24/2017 0247   LEUKOCYTESUR Negative 08/04/2014 1439       ) Recent Results (from the past 240 hour(s))  Culture, blood (Routine x 2)     Status: Abnormal   Collection Time: 08/24/17  2:47 AM  Result Value Ref Range Status   Specimen Description BLOOD LEFT HAND  Final   Special Requests   Final    BOTTLES DRAWN AEROBIC AND ANAEROBIC Blood Culture adequate volume   Culture  Setup Time   Final    GRAM POSITIVE COCCI IN CHAINS IN BOTH AEROBIC AND ANAEROBIC BOTTLES CRITICAL RESULT CALLED TO, READ BACK BY AND VERIFIED WITH: Karlene Einstein PHARMD 2100 08/24/17 A BROWNING    Culture STREPTOCOCCUS MITIS/ORALIS (A)  Final   Report Status 08/26/2017 FINAL  Final   Organism ID, Bacteria STREPTOCOCCUS MITIS/ORALIS  Final      Susceptibility   Streptococcus mitis/oralis - MIC*    ERYTHROMYCIN 2 RESISTANT Resistant     TETRACYCLINE >=16 RESISTANT Resistant     VANCOMYCIN 0.5 SENSITIVE Sensitive     CLINDAMYCIN <=0.25 SENSITIVE Sensitive     * STREPTOCOCCUS MITIS/ORALIS  Blood Culture ID Panel (Reflexed)     Status: Abnormal   Collection Time: 08/24/17  2:47 AM  Result Value Ref Range Status   Enterococcus species NOT DETECTED NOT DETECTED Final   Vancomycin resistance NOT DETECTED NOT DETECTED Final   Listeria monocytogenes NOT DETECTED NOT DETECTED Final   Staphylococcus species NOT DETECTED NOT DETECTED Final   Staphylococcus aureus NOT DETECTED NOT DETECTED Final   Methicillin resistance NOT DETECTED NOT DETECTED Final   Streptococcus species DETECTED (A) NOT DETECTED Final    Comment: Not Enterococcus species, Streptococcus agalactiae, Streptococcus pyogenes, or Streptococcus pneumoniae. CRITICAL RESULT CALLED TO, READ BACK BY AND VERIFIED WITH: Karlene Einstein PHARMD 2100 08/24/17 A BROWNING    Streptococcus agalactiae NOT DETECTED NOT DETECTED Final   Streptococcus pneumoniae NOT DETECTED NOT DETECTED Final   Streptococcus pyogenes NOT DETECTED NOT DETECTED Final   Acinetobacter baumannii NOT DETECTED NOT DETECTED Final   Enterobacteriaceae  species NOT DETECTED NOT DETECTED Final   Enterobacter cloacae complex NOT DETECTED NOT DETECTED Final   Escherichia coli NOT DETECTED NOT DETECTED Final   Klebsiella oxytoca NOT DETECTED NOT DETECTED Final   Klebsiella pneumoniae NOT DETECTED NOT DETECTED Final   Proteus species NOT DETECTED NOT DETECTED Final  Serratia marcescens NOT DETECTED NOT DETECTED Final   Carbapenem resistance NOT DETECTED NOT DETECTED Final   Haemophilus influenzae NOT DETECTED NOT DETECTED Final   Neisseria meningitidis NOT DETECTED NOT DETECTED Final   Pseudomonas aeruginosa NOT DETECTED NOT DETECTED Final   Candida albicans NOT DETECTED NOT DETECTED Final   Candida glabrata NOT DETECTED NOT DETECTED Final   Candida krusei NOT DETECTED NOT DETECTED Final   Candida parapsilosis NOT DETECTED NOT DETECTED Final   Candida tropicalis NOT DETECTED NOT DETECTED Final  Culture, blood (Routine x 2)     Status: None (Preliminary result)   Collection Time: 08/24/17  3:10 AM  Result Value Ref Range Status   Specimen Description BLOOD RIGHT ANTECUBITAL  Final   Special Requests   Final    BOTTLES DRAWN AEROBIC ONLY Blood Culture adequate volume   Culture NO GROWTH 3 DAYS  Final   Report Status PENDING  Incomplete      Anti-infectives    None       Scheduled Meds: . amLODipine  5 mg Oral Daily  . aspirin  81 mg Oral Daily  . furosemide  40 mg Oral Daily  . insulin aspart  0-20 Units Subcutaneous TID WC  . insulin aspart  0-5 Units Subcutaneous QHS  . insulin aspart  4 Units Subcutaneous TID WC  . insulin glargine  15 Units Subcutaneous QHS  . isosorbide mononitrate  30 mg Oral Daily  . metoprolol tartrate  50 mg Oral BID  . simvastatin  20 mg Oral QPM   Continuous Infusions:   LOS: 2 days    Time spent: 30 min    Barton Dubois, MD [facility] Pager [phone]  If 7PM-7AM, please contact night-coverage [email or web address] Password [facility] 08/27/2017, 9:38 PM

## 2017-08-27 NOTE — Progress Notes (Signed)
Progress Note  Patient Name: [name] Date of Encounter: 08/27/2017  Primary Cardiologist:   Aydrien Froman  Subjective   [name] a 76 y.o.femalewith a hx of non obstructive CAD, HTN, HLD, CKD stage III-IV with baseline Scr of 1.7, AML in remission (tx with Doxurubicin), COPD and chronic anemiawho is being seen today for the evaluation of elevated troponinat the request of Dr. Eliseo Squires  Overall is doing well.  Mild continued DOE .  No significant angina   Inpatient Medications    Scheduled Meds: . amLODipine  5 mg Oral Daily  . aspirin EC  325 mg Oral Daily  . furosemide  40 mg Oral Daily  . insulin aspart  0-20 Units Subcutaneous TID WC  . insulin aspart  0-5 Units Subcutaneous QHS  . insulin aspart  4 Units Subcutaneous TID WC  . insulin glargine  15 Units Subcutaneous QHS  . metoprolol tartrate  50 mg Oral BID  . simvastatin  20 mg Oral QPM   Continuous Infusions:  PRN Meds: acetaminophen **OR** acetaminophen, HYDROcodone-acetaminophen, LORazepam, ondansetron **OR** ondansetron (ZOFRAN) IV   Vital Signs    Vitals:   08/26/17 1600 08/26/17 1937 08/27/17 0008 08/27/17 0501  BP: (!) 112/40 (!) 135/42 (!) 104/47 (!) 136/55  Pulse: 75 69 67 81  Resp: (!) 23 20 20 20   Temp: 98.1 F (36.7 C) 98.4 F (36.9 C) 98.8 F (37.1 C) 97.7 F (36.5 C)  TempSrc:  Oral Oral Oral  SpO2: 98% 99% 96% 93%  Weight:    235 lb 6.4 oz (106.8 kg)  Height:        Intake/Output Summary (Last 24 hours) at 08/27/17 0935 Last data filed at 08/27/17 0934  Gross per 24 hour  Intake             1320 ml  Output             1300 ml  Net               20 ml   Filed Weights   08/25/17 0518 08/26/17 0543 08/27/17 0501  Weight: 234 lb (106.1 kg) 235 lb 12.8 oz (107 kg) 235 lb 6.4 oz (106.8 kg)    Telemetry    NSR  - Personally Reviewed  ECG    NSR  - Personally Reviewed  Physical Exam   GEN: morbidly obese  Female.   No acute distress.   Neck: No JVD Cardiac: RR,     Respiratory: Clear to auscultation bilaterally. GI: Soft, nontender, non-distended  MS: trace edema . Neuro:  Nonfocal  Psych: Normal affect   Labs    Chemistry Recent Labs Lab 08/25/17 0504 08/26/17 0422 08/27/17 0225  NA 139 136 136  K 4.4 5.2* 5.6*  CL 107 106 108  CO2 25 23 19*  GLUCOSE 148* 170* 200*  BUN 28* 36* 43*  CREATININE 2.05* 2.32* 2.31*  CALCIUM 8.8* 8.7* 8.3*  GFRNONAA 23* 19* 20*  GFRAA 26* 23* 23*  ANIONGAP 7 7 9      Hematology Recent Labs Lab 08/25/17 0504 08/26/17 0422 08/27/17 0225  WBC 5.8 5.4 5.5  RBC 2.96* 3.04* 2.88*  HGB 8.9* 9.0* 8.7*  HCT 28.7* 29.3* 28.0*  MCV 97.0 96.4 97.2  MCH 30.1 29.6 30.2  MCHC 31.0 30.7 31.1  RDW 14.6 14.3 14.5  PLT 141* 153 151    Cardiac Enzymes Recent Labs Lab 08/24/17 0810 08/24/17 1049 08/24/17 1829 08/24/17 2242  TROPONINI 0.34* 0.33* 0.40* 0.47*  Recent Labs Lab 08/24/17 0145  TROPIPOC 0.01     BNP Recent Labs Lab 08/24/17 0130  BNP 357.9*     DDimer No results for input(s): DDIMER in the last 168 hours.   Radiology    No results found.  Cardiac Studies      Patient Profile     76 y.o. female with morbid obesity, DM, acute on chronic combined CHF + troponins   Assessment & Plan    1.  Non-ST segment elevation myocardial infarction: The patient seems to be feeling better. She has apical akinesis suggestive of a mid/distal LAD stenosis. She has chronic stage 3-4 chronic kidney disease and would prefer not to go on dialysis. Because of this we will continue with medical therapy.   Add Imdur  Decrease ASA to 81 mg a day  She has CKD and is at risk for renal failure if we cath her.   She does not want to go on dialysis.   For now, she seems to be doing well  If she were to start to have significant CP that does not resolve, we certainly would consider cath / PCI   Needs to get up and walk  Needs to lose weight,  Get diabetes under control   For questions or updates,  please contact [place] Please consult [email or web address] for contact info under Cardiology/STEMI.      Signed, Mertie Moores, MD  08/27/2017, 9:35 AM

## 2017-08-27 NOTE — Consult Note (Signed)
   [facility] Inpatient Consult   08/27/2017  AMIEL SHARROW 01/14/1941 [id number]  Patient screened for re-start of services [facility] services in the [place].  Met with patient and daughter at the bedside regarding [facility] for community needs and follow up. Patient declined any needs at this time.   Patient endorses she sees her primary care provider, Dr. Bluford Kaufmann of [facility].  This office is listed to provide post hospital transition of care calls and follow up.  Patient did accept brochure, contact information with 24 hour nurse advise line and has been assign by inpatient RNCM for EMMI HF calls a information sheet given regarding calls by this Probation officer.   Please place a [facility] Care Management consult or for questions contact:   [name], RN BSN [place] [facility] Liaison  [phone] business mobile phone Toll free office [phone]

## 2017-08-27 NOTE — Progress Notes (Signed)
Inpatient Diabetes Program Recommendations  AACE/ADA: New Consensus Statement on Inpatient Glycemic Control (2015)  Target Ranges:  Prepandial:   less than 140 mg/dL      Peak postprandial:   less than 180 mg/dL (1-2 hours)      Critically ill patients:  140 - 180 mg/dL   Results for [name], [name] (MRN [id number]) as of 08/27/2017 08:26  Ref. Range 08/26/2017 07:37 08/26/2017 11:04 08/26/2017 16:16 08/26/2017 21:16 08/26/2017 23:21 08/27/2017 07:42  Glucose-Capillary Latest Ref Range: 65 - 99 mg/dL 217 (H)  Novolog 11 units 106 (H)  NO Meal Cov given (ate 100%) 241 (H)  Novolog 11 units 75 156 (H)  Lantus 15 units 224 (H)  Novolog 11 units   Results for [name], [name] (MRN [id number]) as of 08/27/2017 08:26  Ref. Range 08/25/2017 07:27 08/25/2017 11:42 08/25/2017 16:46 08/25/2017 17:28 08/25/2017 18:08 08/25/2017 21:17  Glucose-Capillary Latest Ref Range: 65 - 99 mg/dL 207 (H)  Novolog 11 units 272 (H)  Novolog 15 units 72 103 (H)   140 (H)  Novolog 4 units 215 (H)  Novolog 2 units  Lantus 20 units   Review of Glycemic Control  Diabetes history: DM2 Outpatient Diabetes medications: Toujeo 40 units QHS, Humalog 0-8 units correction scale, Actos 15 mg daily, Glipizide XL 5 mg daily, Metformin 500 mg BID Current orders for Inpatient glycemic control: Lantus 15 units QHS, Novolog 0-20 units TID with meals, Novolog 0-5 units QHS, Novolog 4 units TID with meals  Inpatient Diabetes Program Recommendations: Insulin - Basal: Please consider increasing Lantus back up to 20 units QHS. Correction (SSI): Please decrease Novolog correction scale to 0-15 units TID with meals. Insulin - Meal Coverage: Please consider increasing meal coverage to Novolog 6 units TID with meals.  NOTE: In reviewing chart, noted patient was ordered Lantus 20 units but it was decreased on 08/26/17. Patient's glucose 75 mg/dl at 21:16 on 08/26/17 but likely due to too much Novolog correction insulin. Also noted to be down  to 72 mg/dl on 08/25/17 at 16:46 which was also likely due to too much Novolog correction insulin. Recommend increase Lantus back up to 20 units, increasing meal coverage, and decreasing Novolog correction scale.  Thanks, Barnie Alderman, RN, MSN, CDE Diabetes Coordinator Inpatient Diabetes Program [phone] (Team Pager from 8am to 5pm)

## 2017-08-28 ENCOUNTER — Other Ambulatory Visit: Payer: Self-pay | Admitting: *Deleted

## 2017-08-28 ENCOUNTER — Other Ambulatory Visit: Payer: Medicare HMO

## 2017-08-28 ENCOUNTER — Ambulatory Visit: Payer: Medicare HMO | Admitting: Oncology

## 2017-08-28 DIAGNOSIS — N183 Chronic kidney disease, stage 3 (moderate): Secondary | ICD-10-CM

## 2017-08-28 DIAGNOSIS — I5042 Chronic combined systolic (congestive) and diastolic (congestive) heart failure: Secondary | ICD-10-CM

## 2017-08-28 DIAGNOSIS — I214 Non-ST elevation (NSTEMI) myocardial infarction: Secondary | ICD-10-CM

## 2017-08-28 DIAGNOSIS — I5043 Acute on chronic combined systolic (congestive) and diastolic (congestive) heart failure: Secondary | ICD-10-CM

## 2017-08-28 DIAGNOSIS — E1329 Other specified diabetes mellitus with other diabetic kidney complication: Secondary | ICD-10-CM

## 2017-08-28 DIAGNOSIS — I252 Old myocardial infarction: Secondary | ICD-10-CM

## 2017-08-28 DIAGNOSIS — N058 Unspecified nephritic syndrome with other morphologic changes: Secondary | ICD-10-CM

## 2017-08-28 DIAGNOSIS — Z23 Encounter for immunization: Secondary | ICD-10-CM | POA: Diagnosis not present

## 2017-08-28 LAB — GLUCOSE, CAPILLARY
GLUCOSE-CAPILLARY: 309 mg/dL — AB (ref 65–99)
Glucose-Capillary: 198 mg/dL — ABNORMAL HIGH (ref 65–99)

## 2017-08-28 LAB — BASIC METABOLIC PANEL
ANION GAP: 10 (ref 5–15)
BUN: 49 mg/dL — ABNORMAL HIGH (ref 6–20)
CALCIUM: 8.2 mg/dL — AB (ref 8.9–10.3)
CO2: 20 mmol/L — ABNORMAL LOW (ref 22–32)
CREATININE: 2.35 mg/dL — AB (ref 0.44–1.00)
Chloride: 106 mmol/L (ref 101–111)
GFR calc non Af Amer: 19 mL/min — ABNORMAL LOW (ref >60)
GFR, EST AFRICAN AMERICAN: 22 mL/min — AB (ref >60)
Glucose, Bld: 239 mg/dL — ABNORMAL HIGH (ref 65–99)
Potassium: 5 mmol/L (ref 3.5–5.1)
Sodium: 136 mmol/L (ref 135–145)

## 2017-08-28 MED ORDER — ASPIRIN 81 MG PO CHEW: 81.0000 mg | CHEWABLE_TABLET | Freq: Every day | ORAL | 1 refills | Status: DC

## 2017-08-28 MED ORDER — ISOSORBIDE MONONITRATE ER 30 MG PO TB24
30.0000 mg | ORAL_TABLET | Freq: Every day | ORAL | 1 refills | Status: DC
Start: 2017-08-29 — End: 2017-10-09

## 2017-08-28 NOTE — Progress Notes (Signed)
Progress Note  Patient Name: [name] Date of Encounter: 08/28/2017  Primary Cardiologist: Dr. Acie Fredrickson  Subjective   Pt states that she continues to feel better. No longer having the intense chest burning that she was having on admission. She is still having DOE with very little exertion like up to [facility], but is much improved. She states that her edema is also better although still 1+ pitting.  Inpatient Medications    Scheduled Meds: . amLODipine  5 mg Oral Daily  . aspirin  81 mg Oral Daily  . furosemide  40 mg Oral Daily  . insulin aspart  0-20 Units Subcutaneous TID WC  . insulin aspart  0-5 Units Subcutaneous QHS  . insulin aspart  4 Units Subcutaneous TID WC  . insulin glargine  15 Units Subcutaneous QHS  . isosorbide mononitrate  30 mg Oral Daily  . metoprolol tartrate  50 mg Oral BID  . simvastatin  20 mg Oral QPM   Continuous Infusions:  PRN Meds: acetaminophen **OR** acetaminophen, HYDROcodone-acetaminophen, LORazepam, ondansetron **OR** ondansetron (ZOFRAN) IV   Vital Signs    Vitals:   08/27/17 1202 08/27/17 1940 08/28/17 0452 08/28/17 0813  BP: 102/68 (!) 114/43 (!) 108/42 (!) 128/50  Pulse: 62 66 72 72  Resp: 18 18 20    Temp: 98.4 F (36.9 C) 98.6 F (37 C) 98.2 F (36.8 C) 97.9 F (36.6 C)  TempSrc: Oral Oral Oral Oral  SpO2: 93% 93% 93% 96%  Weight:   236 lb 12.8 oz (107.4 kg)   Height:        Intake/Output Summary (Last 24 hours) at 08/28/17 0907 Last data filed at 08/28/17 0455  Gross per 24 hour  Intake             1200 ml  Output              700 ml  Net              500 ml   Filed Weights   08/26/17 0543 08/27/17 0501 08/28/17 0452  Weight: 235 lb 12.8 oz (107 kg) 235 lb 6.4 oz (106.8 kg) 236 lb 12.8 oz (107.4 kg)    Telemetry    First degree AVB in the 60's, occ 50's with PVC's - Personally Reviewed  ECG    No new tracings - Personally Reviewed  Physical Exam   GEN: Obese caucasian female. No acute distress.   Neck: No  JVD Cardiac: RRR, no murmurs, rubs, or gallops.  Respiratory: Clear to auscultation bilaterally. GI: Soft, nontender, non-distended  MS: 1+ pitting edema in lower legs.  Neuro:  Nonfocal  Psych: Normal affect   Labs    Chemistry Recent Labs Lab 08/26/17 0422 08/27/17 0225 08/28/17 0420  NA 136 136 136  K 5.2* 5.6* 5.0  CL 106 108 106  CO2 23 19* 20*  GLUCOSE 170* 200* 239*  BUN 36* 43* 49*  CREATININE 2.32* 2.31* 2.35*  CALCIUM 8.7* 8.3* 8.2*  GFRNONAA 19* 20* 19*  GFRAA 23* 23* 22*  ANIONGAP 7 9 10      Hematology Recent Labs Lab 08/25/17 0504 08/26/17 0422 08/27/17 0225  WBC 5.8 5.4 5.5  RBC 2.96* 3.04* 2.88*  HGB 8.9* 9.0* 8.7*  HCT 28.7* 29.3* 28.0*  MCV 97.0 96.4 97.2  MCH 30.1 29.6 30.2  MCHC 31.0 30.7 31.1  RDW 14.6 14.3 14.5  PLT 141* 153 151    Cardiac Enzymes Recent Labs Lab 08/24/17 0810 08/24/17 1049 08/24/17 1829  08/24/17 2242  TROPONINI 0.34* 0.33* 0.40* 0.47*    Recent Labs Lab 08/24/17 0145  TROPIPOC 0.01     BNP Recent Labs Lab 08/24/17 0130  BNP 357.9*     DDimer No results for input(s): DDIMER in the last 168 hours.   Radiology    No results found.  Cardiac Studies   Echocardiogram 08/24/17 Study Conclusions  - Left ventricle: The cavity size was normal. There was moderate   concentric hypertrophy. Systolic function was mildly to   moderately reduced. The estimated ejection fraction was in the   range of 40% to 45%. Hypokinesis of all mid-apical segments.   Apical akinesis. - Aortic valve: Transvalvular velocity was within the normal range.   There was no stenosis. There was no regurgitation. - Mitral valve: Mild thickening and calcification. Transvalvular   velocity was within the normal range. There was no evidence for   stenosis. There was trivial regurgitation. - Right ventricle: The cavity size was normal. Wall thickness was   normal. - Tricuspid valve: There was trivial regurgitation. - Pulmonary  arteries: Systolic pressure was moderately increased.   PA peak pressure: 51 mm Hg (S).   Patient Profile     76 y.o. female with a hx of non obstructive CAD, HTN, HLD, CKD stage III-IV with baseline Scr of 1.7, AML in remission (tx with Doxurubicin), COPD and chronic anemiawho is being seen for evaluation of elevated troponin and acute on chronic combined CHF.   Assessment & Plan    NonSTEMI: Troponins mildly elevated 0.47. She has apical akinesis on echo suggestive of a mid/distal LAD stenosis. She has chronic stage 3-4 CKD and would prefer not to go on dialysis. Continuing medical therapy with Imdur added. No longer having chest burning. Would Consider cath/PCI if significant chest pain that does not resolve.   Acute on chronic combine heart failure: Echo showed EF 40-45%. On Lasix 40 mg. BNP was 357.9. WT 234-->236. UOP is only fair with [phone] ml/day. Still looks volume overloaded. Continue lasix.   CKD: SCr 2.35- elevated but stable. Baseline appears to be around 1.4. ACE-I is on hold.   Hypertension: Continuing on amlodipine 5, metoprolol 50 bid. BP is stable.   Hyperlipidemia:  Simvastatin 20 mg daily. No recent liid pane.  For questions or updates, please contact [place] Please consult [email or web address] for contact info under Cardiology/STEMI.      Signed, Daune Perch, NP  08/28/2017, 9:07 AM    Attending Note:   The patient was seen and examined.  Agree with assessment and plan as noted above.  Changes made to the above note as needed.  Patient seen and independently examined with Pecolia Ades, NP.   We discussed all aspects of the encounter. I agree with the assessment and plan as stated above.  1.  NSTEMI:   Doing well.  No CP currently . Continue medical therapy.   Will reserve cath if she fails medical therapy  2.  Acute on chronic combined CHF:    Doing well   3.. Acute on chronic kidney disease;   Will need close follow up    I have spent a total  of 40 minutes with patient reviewing hospital  notes , telemetry, EKGs, labs and examining patient as well as establishing an assessment and plan that was discussed with the patient. > 50% of time was spent in direct patient care.    Thayer Headings, Brooke Bonito., MD, [facility] 08/28/2017, 11:10 AM [address],  [place] Pager [phone]

## 2017-08-28 NOTE — Care Management Important Message (Signed)
Important Message  Patient Details  Name: [name] MRN: [id number] Date of Birth: 1941/06/25   Medicare Important Message Given:  Yes    Orbie Pyo 08/28/2017, 12:24 PM

## 2017-08-28 NOTE — Progress Notes (Signed)
Pt stated that she will wait till she goes home being d/c today

## 2017-08-28 NOTE — Progress Notes (Signed)
Pt has orders to be discharged. Discharge instructions given and pt has no additional questions at this time. Medication regimen reviewed and pt educated. Pt verbalized understanding and has no additional questions. Telemetry box removed. IV removed and site in good condition. Pt stable and waiting for transportation.  Vir Whetstine RN 

## 2017-08-28 NOTE — Discharge Summary (Addendum)
Physician Discharge Summary  [name] QGB:[id number] DOB: [birth year] DOA: 08/24/2017  PCP: Marletta Lor, MD  Admit date: 08/24/2017 Discharge date: 08/28/2017  Time spent: 35 minutes  Recommendations for Outpatient Follow-up:  Repeat BMET to assess electrolytes and renal function. Follow CBG's/diabetes and adjust hypoglycemic regimen. Check bp  And adjust antihypertensive regimen if needed  Follow volume status and further complaints of CP; patient to follow up with cardiology service after discharge. Check CBC to follow platelets and Hgb trend  Check Thyroid panel in 3 weeks   Discharge Diagnoses:  Principal Problem:   Acute CHF (congestive heart failure) (HCC) Active Problems:   Diabetes mellitus with renal complications (HCC)   Essential hypertension   Back pain   CKD (chronic kidney disease) stage 3, GFR 30-59 ml/min   AML (acute myeloblastic leukemia) (HCC)   Other pancytopenia (HCC)   COPD (chronic obstructive pulmonary disease) (HCC)   NSTEMI (non-ST elevated myocardial infarction) (HCC)   Acute on chronic combined systolic and diastolic CHF (congestive heart failure) ([place]) morbid Obesity   Discharge Condition: stable and improved. No CP and looking to go home. Will follow up with PCP in 10 days and will have also follow up with cardiology service after discharge.  Diet recommendation: heart healthy diet and modified carbohydrates diet.  Filed Weights   08/26/17 0543 08/27/17 0501 08/28/17 0452  Weight: 107 kg (235 lb 12.8 oz) 106.8 kg (235 lb 6.4 oz) 107.4 kg (236 lb 12.8 oz)    History of present illness:  As per H&P written by Dr. Loleta Books on 08/24/17 76 y.o. female with a past medical history significant for AML in remission since 2015, treated with doxurubicin, CKD III-IV baseline Cr 1.7, chronic anemia, and moderate COPD who presents with chest discomfort. The patient was in her usual state of health (very limited mobility since her chemotherapy,  widespread pain, he relates only short distances), until about the last month. Over the last several weeks, she has started to noticed leg swelling. She has not been able to notice exertional dyspnea, however she can only walk from one room to the other at baseline. Then in the last 24 hours, she has had burning chest discomfort with inspiration, worse with exertion, new orthopnea, and for the last several nights, she has been waking up with chest discomfort, relieved with sitting up and taking nitroglycerin.  Hospital Course:  Acute systolic/diastolic CHF/NSTEMI- EF 97-58% with hypokinesis / grade 2 diastolic CHF -no CP at discharge -per cardiology rec's, no candidate for cath given CKD and current CP free status. Will continue medical management; see below for proper rec's -cardiology consult recommendations: "apical akinesis which is suggestive of a mid/distal LAD occlusion. She has stage 3-4 chronic kidney disease and does not want to go on dialysis. We've tried to treat her medically. Since that time she's done well and has not had any recurrent chest pain. I would continue with medical therapy. I would reserve heart catheter station if she fails medical therapy." -started on Imdur and will continue b-blocker. Patient also on lasix for volume control  -advise to check weight on daily basis  -close follow up of renal function at follow up visit  -advise to use TED hose for LE edema  COPD: -patient with Some SOB -No clear wheezing, favor that this is all CHF. -continue PRN duoneb  HLD -continue statins   Hyperkalemia -will recommend BMWET at follow up visit to reassess electrolytes trend  -K WNL at discharge   Hypertension: -  Continue metoprolol and amlodipine -Lasix also helping with BP control. -advise to follow heart healthy diet  -Continue aspirin, statin  Elevated TSH -TSH 6.197 -normal free T4 -will recommend repeat thyroid panel in 3 weeks and start treatment if  needed.  Diabetes: -Hyperglycemic at admission. -Continue modified carbohydrates and resume glipizide  -will hold metformin in setting of CKD with worsening Cr and inpatient status -due to CHF will stop Actos.  CKD IV: -Baseline Cr 1.7, eGFR 28. -Monitor renal function daily, especially with diuresis -continue hold lisinopril in setting of increasing Cr from baseline  -continue lasix by mouth   Anemia and thrombocytopenia: -Slightly down from baseline.  -Just saw her Oncologist in the last few weeks. -no active bleeding appreciated -will recommend CBC to monitor trend   1/2 blood cultures-- + for gram + cocci -streptococcus mitis/oralis -sensitive to vancomycin and cleocin -discussed with ID and decided not to be treated and labeled as a contaminant -no fever, and non toxic.  Morbid Obesity -Body mass index is 43.31 kg/m. -low calorie diet discussed with patient   Procedures:  2-D echo - Left ventricle: The cavity size was normal. There was moderate concentric hypertrophy. Systolic function was mildly to moderately reduced. The estimated ejection fraction was in the range of 40% to 45%. Hypokinesis of all mid-apical segments. Apical akinesis. - Aortic valve: Transvalvular velocity was within the normal range. There was no stenosis. There was no regurgitation. - Mitral valve: Mild thickening and calcification. Transvalvular velocity was within the normal range. There was no evidence for stenosis. There was trivial regurgitation. - Right ventricle: The cavity size was normal. Wall thickness was normal. - Tricuspid valve: There was trivial regurgitation. - Pulmonary arteries: Systolic pressure was moderately increased. PA peak pressure: 51 mm Hg (S).  Consultations:  Cardiology   ID (Curbside)  Discharge Exam: Vitals:   08/28/17 0452 08/28/17 0813  BP: (!) 108/42 (!) 128/50  Pulse: 72 72  Resp: 20   Temp: 98.2 F (36.8 C) 97.9 F  (36.6 C)  SpO2: 93% 96%   General exam: sitting up on bed, no fever, no CP, no nausea or vomiting. Feeling better and reporting improvement in her breathing. Respiratory system: no wheezing, no frank crackles, improved air movement  Cardiovascular system: rrr, no rubs, no gallops Gastrointestinal system: positive BS, no guarding, no distension  Central nervous system: AAOX3, CN intact, no focal motor deficit  Psychiatry: flat affect, no hallucinations, no SI   Discharge Instructions   Discharge Instructions    (HEART FAILURE PATIENTS) Call MD:  Anytime you have any of the following symptoms: 1) 3 pound weight gain in 24 hours or 5 pounds in 1 week 2) shortness of breath, with or without a dry hacking cough 3) swelling in the hands, feet or stomach 4) if you have to sleep on extra pillows at night in order to breathe.    Complete by:  As directed    Diet - low sodium heart healthy    Complete by:  As directed    Diet Carb Modified    Complete by:  As directed    Discharge instructions    Complete by:  As directed    Take medications as prescribed  Follow up with PCP in 10 days Outpatient follow up with cardiology as instructed (office will contact you with appointment details) Low sodium diet (less than 2 gram daily) Fluid restriction to 2L daily Check weight on daily basis and contact cardiology office with more than 3   pounds gained overnight and/or more than 5 pounds in 1 week.   Increase activity slowly    Complete by:  As directed      Current Discharge Medication List    START taking these medications   Details  aspirin 81 MG chewable tablet Chew 1 tablet (81 mg total) by mouth daily. Qty: 30 tablet, Refills: 1    isosorbide mononitrate (IMDUR) 30 MG 24 hr tablet Take 1 tablet (30 mg total) by mouth daily. Qty: 30 tablet, Refills: 1      CONTINUE these medications which have NOT CHANGED   Details  amLODipine (NORVASC) 5 MG tablet Take 1 tablet (5 mg total) by mouth  daily. Qty: 90 tablet, Refills: 1    budesonide-formoterol (SYMBICORT) 160-4.5 MCG/ACT inhaler Inhale 2 puffs into the lungs 2 (two) times daily. Qty: 1 Inhaler, Refills: 5    furosemide (LASIX) 40 MG tablet Take 1 tablet (40 mg total) by mouth daily. Qty: 90 tablet, Refills: 1    glipiZIDE (GLUCOTROL XL) 5 MG 24 hr tablet Take 5 mg by mouth daily.    HYDROcodone-acetaminophen (NORCO/VICODIN) 5-325 MG tablet Take 1 tablet by mouth every 6 (six) hours as needed for moderate pain. Qty: 120 tablet, Refills: 0    Insulin Glargine (TOUJEO SOLOSTAR) 300 UNIT/ML SOPN Inject 20 Units into the skin at bedtime. Qty: 1 pen, Refills: 6    insulin lispro (HUMALOG) 100 UNIT/ML KiwkPen Blood sugar less than 70- no insulin Blood sugar 71-150- 4 units Blood sugar 151- 200- 6 units Blood sugar over 200-  8 units Qty: 15 mL, Refills: 11    LORazepam (ATIVAN) 0.5 MG tablet Take 1 tablet (0.5 mg total) by mouth every 6 (six) hours as needed for anxiety. Qty: 90 tablet, Refills: 5    magnesium oxide (MAG-OX) 400 (241.3 Mg) MG tablet Take 2 tablets (800 mg total) by mouth 2 (two) times daily. Qty: 360 tablet, Refills: 3    metoprolol tartrate (LOPRESSOR) 50 MG tablet TAKE 1 TABLET (50 MG TOTAL) BY MOUTH 2 (TWO) TIMES DAILY. Qty: 180 tablet, Refills: 1    nitroGLYCERIN (NITROSTAT) 0.4 MG SL tablet Place 1 tablet (0.4 mg total) under the tongue every 5 (five) minutes as needed for chest pain. Qty: 90 tablet, Refills: 1    simvastatin (ZOCOR) 20 MG tablet Take 1 tablet (20 mg total) by mouth every evening. Qty: 90 tablet, Refills: 2    promethazine (PHENERGAN) 12.5 MG tablet Take 2 tablets (25 mg total) by mouth every 6 (six) hours as needed for nausea. Qty: 12 tablet, Refills: 0      STOP taking these medications     aspirin EC 325 MG tablet      lisinopril (PRINIVIL,ZESTRIL) 10 MG tablet      meloxicam (MOBIC) 7.5 MG tablet      metFORMIN (GLUCOPHAGE) 500 MG tablet      pioglitazone  (ACTOS) 15 MG tablet        No Known Allergies Follow-up Information    Marletta Lor, MD. Schedule an appointment as soon as possible for a visit in 10 day(s).   Specialty:  Internal Medicine Contact information: [address] [place] [id number] [phone]            The results of significant diagnostics from this hospitalization (including imaging, microbiology, ancillary and laboratory) are listed below for reference.    Significant Diagnostic Studies: Dg Chest 2 View  Result Date: 08/24/2017 CLINICAL DATA:  Shortness of breath EXAM:  CHEST  2 VIEW COMPARISON:  03/27/2017 FINDINGS: Mild cardiac enlargement. No pulmonary vascular congestion or edema. Mild blunting of costophrenic angles suggesting small effusions. No pneumothorax. Mediastinal contours appear intact. Calcification of the aorta. Power port type central venous catheter on the right with tip over the low SVC region. Degenerative changes in the shoulders. IMPRESSION: Cardiac enlargement. Blunting of the costophrenic angles suggesting small effusions. No edema or consolidation. Electronically Signed   By: William  Stevens M.D.   On: 08/24/2017 01:40   Mr Lumbar Spine Wo Contrast  Result Date: 08/08/2017 CLINICAL DATA:  76-year-old female with chronic lumbar back pain affecting both legs. Progressive symptoms. Personal history of acute myeloblastic leukemia diagnosed in 2015. EXAM: MRI LUMBAR SPINE WITHOUT CONTRAST TECHNIQUE: Multiplanar, multisequence MR imaging of the lumbar spine was performed. No intravenous contrast was administered. COMPARISON:  Lumbar MRI and CT Abdomen and Pelvis  02/23/2014 FINDINGS: Segmentation: Normal as demonstrated on the comparison CT which is the same numbering system used on the 2015 MRI. Alignment / Vertebrae: Severely heterogeneous bone marrow signal throughout the visible spine and pelvis. This is compared to homogeneous but diffusely abnormal T1 marrow signal in 2015.  Multiple 10 mm or smaller areas of decreased T1 marrow signal today are associated with increased STIR signal, most notably in the central L2 body, the inferior L4 endplate, the central S1 body, and the posteromedial left iliac bone. Associated increased diffuse lower thoracic and lumbar endplate irregularity since 2015. No significant loss of lumbar vertebral body height. Chronic straightening of lumbar lordosis. Conus medullaris: Extends to the L1 level and appears normal. Paraspinal and other soft tissues: Diffusely decreased T2 signal in the visible liver and spleen. Bilateral renal cortical volume loss since 2015. There is a degree of diffuse paraspinal muscle atrophy since 2015. Disc levels: Superimposed degenerative changes: T10-T11: Circumferential disc bulge with broad-based posterior component. Borderline to mild spinal stenosis. T11-T12: Chronic circumferential disc bulge and mild spinal stenosis. No definite spinal cord mass effect. T12-L1:  Circumferential disc bulge without spinal stenosis. L1-L2:  Circumferential disc bulge without spinal stenosis. L2-L3:  Circumferential disc bulge without spinal stenosis. L3-L4: Mostly far lateral circumferential disc bulge with mild facet hypertrophy but decreased ligament flavum hypertrophy and improved thecal sac patency since 2015. L4-L5: Mostly far lateral disc bulge. Mild facet and ligament flavum hypertrophy. No spinal stenosis. L5-S1:  Negative. IMPRESSION: 1. Severely heterogeneous bone marrow signal throughout the visible spine and pelvis. This appearance might reflect sequelae of AML therapy, but active leukemia cannot be excluded. 2. Associated endplate irregularity throughout the visible spine has increased since 2015, but there is no bona fide pathologic fracture. 3. 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 Bilateral renal cortical atrophy, and a degree of diffuse paraspinal muscle atrophy since 2015. 4. Chronic mild  lower thoracic spinal stenosis. No lumbar spinal stenosis. Electronically Signed   By: H  Hall M.D.   On: 08/08/2017 20:58    Microbiology: Recent Results (from the past 240 hour(s))  Culture, blood (Routine x 2)     Status: Abnormal   Collection Time: 08/24/17  2:47 AM  Result Value Ref Range Status   Specimen Description BLOOD LEFT HAND  Final   Special Requests   Final    BOTTLES DRAWN AEROBIC AND ANAEROBIC Blood Culture adequate volume   Culture  Setup Time   Final    GRAM POSITIVE COCCI IN CHAINS IN BOTH AEROBIC AND ANAEROBIC BOTTLES CRITICAL RESULT CALLED TO, READ BACK BY AND   VERIFIED WITH: N BATCHELDER PHARMD 2100 08/24/17 A BROWNING    Culture STREPTOCOCCUS MITIS/ORALIS (A)  Final   Report Status 08/26/2017 FINAL  Final   Organism ID, Bacteria STREPTOCOCCUS MITIS/ORALIS  Final      Susceptibility   Streptococcus mitis/oralis - MIC*    ERYTHROMYCIN 2 RESISTANT Resistant     TETRACYCLINE >=16 RESISTANT Resistant     VANCOMYCIN 0.5 SENSITIVE Sensitive     CLINDAMYCIN <=0.25 SENSITIVE Sensitive     * STREPTOCOCCUS MITIS/ORALIS  Blood Culture ID Panel (Reflexed)     Status: Abnormal   Collection Time: 08/24/17  2:47 AM  Result Value Ref Range Status   Enterococcus species NOT DETECTED NOT DETECTED Final   Vancomycin resistance NOT DETECTED NOT DETECTED Final   Listeria monocytogenes NOT DETECTED NOT DETECTED Final   Staphylococcus species NOT DETECTED NOT DETECTED Final   Staphylococcus aureus NOT DETECTED NOT DETECTED Final   Methicillin resistance NOT DETECTED NOT DETECTED Final   Streptococcus species DETECTED (A) NOT DETECTED Final    Comment: Not Enterococcus species, Streptococcus agalactiae, Streptococcus pyogenes, or Streptococcus pneumoniae. CRITICAL RESULT CALLED TO, READ BACK BY AND VERIFIED WITH: N BATCHELDER PHARMD 2100 08/24/17 A BROWNING    Streptococcus agalactiae NOT DETECTED NOT DETECTED Final   Streptococcus pneumoniae NOT DETECTED NOT DETECTED Final    Streptococcus pyogenes NOT DETECTED NOT DETECTED Final   Acinetobacter baumannii NOT DETECTED NOT DETECTED Final   Enterobacteriaceae species NOT DETECTED NOT DETECTED Final   Enterobacter cloacae complex NOT DETECTED NOT DETECTED Final   Escherichia coli NOT DETECTED NOT DETECTED Final   Klebsiella oxytoca NOT DETECTED NOT DETECTED Final   Klebsiella pneumoniae NOT DETECTED NOT DETECTED Final   Proteus species NOT DETECTED NOT DETECTED Final   Serratia marcescens NOT DETECTED NOT DETECTED Final   Carbapenem resistance NOT DETECTED NOT DETECTED Final   Haemophilus influenzae NOT DETECTED NOT DETECTED Final   Neisseria meningitidis NOT DETECTED NOT DETECTED Final   Pseudomonas aeruginosa NOT DETECTED NOT DETECTED Final   Candida albicans NOT DETECTED NOT DETECTED Final   Candida glabrata NOT DETECTED NOT DETECTED Final   Candida krusei NOT DETECTED NOT DETECTED Final   Candida parapsilosis NOT DETECTED NOT DETECTED Final   Candida tropicalis NOT DETECTED NOT DETECTED Final  Culture, blood (Routine x 2)     Status: None (Preliminary result)   Collection Time: 08/24/17  3:10 AM  Result Value Ref Range Status   Specimen Description BLOOD RIGHT ANTECUBITAL  Final   Special Requests   Final    BOTTLES DRAWN AEROBIC ONLY Blood Culture adequate volume   Culture NO GROWTH 3 DAYS  Final   Report Status PENDING  Incomplete     Labs: Basic Metabolic Panel:  Recent Labs Lab 08/24/17 0129 08/25/17 0504 08/26/17 0422 08/27/17 0225 08/28/17 0420  NA 136 139 136 136 136  K 4.4 4.4 5.2* 5.6* 5.0  CL 105 107 106 108 106  CO2 22 25 23 19* 20*  GLUCOSE 443* 148* 170* 200* 239*  BUN 23* 28* 36* 43* 49*  CREATININE 1.73* 2.05* 2.32* 2.31* 2.35*  CALCIUM 8.7* 8.8* 8.7* 8.3* 8.2*   Liver Function Tests: No results for input(s): AST, ALT, ALKPHOS, BILITOT, PROT, ALBUMIN in the last 168 hours. No results for input(s): LIPASE, AMYLASE in the last 168 hours. No results for input(s): AMMONIA in  the last 168 hours. CBC:  Recent Labs Lab 08/24/17 0129 08/24/17 0130 08/25/17 0504 08/26/17 0422 08/27/17 0225  WBC 7.0  --    5.8 5.4 5.5  NEUTROABS  --  5.0  --   --   --   HGB 9.1*  --  8.9* 9.0* 8.7*  HCT 28.2*  --  28.7* 29.3* 28.0*  MCV 96.9  --  97.0 96.4 97.2  PLT 101*  --  141* 153 151   Cardiac Enzymes:  Recent Labs Lab 08/24/17 0810 08/24/17 1049 08/24/17 1829 08/24/17 2242  TROPONINI 0.34* 0.33* 0.40* 0.47*   BNP: BNP (last 3 results)  Recent Labs  08/24/17 0130  BNP 357.9*    ProBNP (last 3 results) No results for input(s): PROBNP in the last 8760 hours.  CBG:  Recent Labs Lab 08/27/17 1207 08/27/17 1629 08/27/17 2221 08/28/17 0730 08/28/17 1139  GLUCAP 192* 120* 114* 198* 309*     Signed:  Barton Dubois MD.  [facility] 08/28/2017, 11:42 AM

## 2017-08-28 NOTE — Telephone Encounter (Signed)
Left message informing pt schedulers will call to reschedule visit missed office visit.

## 2017-08-29 LAB — CULTURE, BLOOD (ROUTINE X 2)
Culture: NO GROWTH
Special Requests: ADEQUATE

## 2017-09-01 ENCOUNTER — Telehealth: Payer: Self-pay | Admitting: Oncology

## 2017-09-01 NOTE — Telephone Encounter (Signed)
Scheduled appt per 9/21 sch message - patient is aware and request an earlier appt .

## 2017-09-02 ENCOUNTER — Telehealth: Payer: Self-pay | Admitting: Internal Medicine

## 2017-09-02 NOTE — Telephone Encounter (Signed)
Pt would like 2 humalog kwik pen samples and will pick up on friday

## 2017-09-03 ENCOUNTER — Other Ambulatory Visit: Payer: Self-pay | Admitting: *Deleted

## 2017-09-03 NOTE — Telephone Encounter (Signed)
Medication Samples are ready for pt to pick up on Friday  Drug name: Humalog U-100      Strength: 100 units/mL       Qty: 2 pens  LOT: [id number]                        Exp.Date: 10/08/19  Abelardo Diesel 9:12 AM 09/03/2017

## 2017-09-03 NOTE — Patient Outreach (Signed)
[facility]) Care Management  09/03/2017  [name] [birth year] [id number]  Referral via EMMI-Heart Failure; Red Flag day #2, 09/02/2017; reason-new worsening problem:  Call#1 to patient who was advised of reason for call. Patient gave HIPPA verification.  Patient voices that she did not have any worsening or new problem since hospital discharge. States she has not had chest pain since discharged 09/21.   Patient voices that she has follow up appointment scheduled with primary care provider-Dr. Burnice Logan 10/2. States she has not received call back for cardiology appointment yet. Voices that she does have transportation to appointments. States she has all of medications & is taking as prescribed.  States she weighs daily & is aware of symptoms that she needs to report to her MD.   Patient voicing no concerns at this time. Per notes,  patient declined [facility] care management  services when offered by [facility]. EMMI-call has been addressed.  Plan: Send to care management assistant to close case.  Sherrin Daisy, RN BSN [place] Management Coordinator [facility] Care Management  [phone]

## 2017-09-04 ENCOUNTER — Telehealth: Payer: Self-pay | Admitting: Oncology

## 2017-09-04 ENCOUNTER — Ambulatory Visit (HOSPITAL_BASED_OUTPATIENT_CLINIC_OR_DEPARTMENT_OTHER): Payer: Medicare HMO | Admitting: Nurse Practitioner

## 2017-09-04 ENCOUNTER — Ambulatory Visit (HOSPITAL_BASED_OUTPATIENT_CLINIC_OR_DEPARTMENT_OTHER): Payer: Medicare HMO

## 2017-09-04 ENCOUNTER — Other Ambulatory Visit (HOSPITAL_BASED_OUTPATIENT_CLINIC_OR_DEPARTMENT_OTHER): Payer: Medicare HMO

## 2017-09-04 VITALS — BP 150/61 | HR 65 | Temp 98.4°F | Resp 20 | Ht 62.0 in | Wt 229.8 lb

## 2017-09-04 DIAGNOSIS — C9201 Acute myeloblastic leukemia, in remission: Secondary | ICD-10-CM | POA: Diagnosis not present

## 2017-09-04 DIAGNOSIS — Z452 Encounter for adjustment and management of vascular access device: Secondary | ICD-10-CM

## 2017-09-04 DIAGNOSIS — N289 Disorder of kidney and ureter, unspecified: Secondary | ICD-10-CM

## 2017-09-04 DIAGNOSIS — E119 Type 2 diabetes mellitus without complications: Secondary | ICD-10-CM

## 2017-09-04 DIAGNOSIS — D61810 Antineoplastic chemotherapy induced pancytopenia: Secondary | ICD-10-CM

## 2017-09-04 DIAGNOSIS — C9202 Acute myeloblastic leukemia, in relapse: Secondary | ICD-10-CM

## 2017-09-04 DIAGNOSIS — Z95828 Presence of other vascular implants and grafts: Secondary | ICD-10-CM

## 2017-09-04 LAB — CBC WITH DIFFERENTIAL/PLATELET
BASO%: 0.3 % (ref 0.0–2.0)
Basophils Absolute: 0 10*3/uL (ref 0.0–0.1)
EOS%: 2 % (ref 0.0–7.0)
Eosinophils Absolute: 0.1 10*3/uL (ref 0.0–0.5)
HCT: 29.3 % — ABNORMAL LOW (ref 34.8–46.6)
HGB: 9.6 g/dL — ABNORMAL LOW (ref 11.6–15.9)
LYMPH%: 23.7 % (ref 14.0–49.7)
MCH: 31.2 pg (ref 25.1–34.0)
MCHC: 32.6 g/dL (ref 31.5–36.0)
MCV: 95.6 fL (ref 79.5–101.0)
MONO#: 0.6 10*3/uL (ref 0.1–0.9)
MONO%: 9.6 % (ref 0.0–14.0)
NEUT#: 3.8 10*3/uL (ref 1.5–6.5)
NEUT%: 64.4 % (ref 38.4–76.8)
PLATELETS: 162 10*3/uL (ref 145–400)
RBC: 3.07 10*6/uL — AB (ref 3.70–5.45)
RDW: 14.7 % — ABNORMAL HIGH (ref 11.2–14.5)
WBC: 5.9 10*3/uL (ref 3.9–10.3)
lymph#: 1.4 10*3/uL (ref 0.9–3.3)

## 2017-09-04 LAB — COMPREHENSIVE METABOLIC PANEL
ALBUMIN: 3.4 g/dL — AB (ref 3.5–5.0)
ALT: 15 U/L (ref 0–55)
AST: 20 U/L (ref 5–34)
Alkaline Phosphatase: 84 U/L (ref 40–150)
Anion Gap: 8 mEq/L (ref 3–11)
BILIRUBIN TOTAL: 0.68 mg/dL (ref 0.20–1.20)
BUN: 20.6 mg/dL (ref 7.0–26.0)
CALCIUM: 9.4 mg/dL (ref 8.4–10.4)
CO2: 26 meq/L (ref 22–29)
Chloride: 111 mEq/L — ABNORMAL HIGH (ref 98–109)
Creatinine: 1.4 mg/dL — ABNORMAL HIGH (ref 0.6–1.1)
EGFR: 37 mL/min/{1.73_m2} — AB (ref >90)
GLUCOSE: 152 mg/dL — AB (ref 70–140)
POTASSIUM: 4.1 meq/L (ref 3.5–5.1)
Sodium: 144 mEq/L (ref 136–145)
Total Protein: 6.3 g/dL — ABNORMAL LOW (ref 6.4–8.3)

## 2017-09-04 MED ORDER — HEPARIN SOD (PORK) LOCK FLUSH 100 UNIT/ML IV SOLN
500.0000 [IU] | Freq: Once | INTRAVENOUS | Status: AC | PRN
  Administered 2017-09-04: 1000 [IU] via INTRAVENOUS
  Filled 2017-09-04: qty 5

## 2017-09-04 MED ORDER — SODIUM CHLORIDE 0.9 % IJ SOLN
10.0000 mL | INTRAMUSCULAR | Status: DC | PRN
  Administered 2017-09-04: 20 mL via INTRAVENOUS
  Filled 2017-09-04: qty 10

## 2017-09-04 NOTE — Progress Notes (Signed)
  [facility] OFFICE PROGRESS NOTE   Diagnosis:  AML  INTERVAL HISTORY:   Ms. Rozman returns for follow-up. She was hospitalized 08/24/2017 through 08/28/2017 with acute CHF. She continues to have shortness of breath with exertion. The chest "burning" has resolved. Leg swelling is better. No bleeding. She denies fever.   Objective:  Vital signs in last 24 hours:  Blood pressure (!) 150/61, pulse 65, temperature 98.4 F (36.9 C), temperature source Oral, resp. rate 20, height 5\' 2"  (1.575 m), weight 229 lb 12.8 oz (104.2 kg), SpO2 96 %.    HEENT: No thrush or ulcers. Lymphatics: No palpable cervical, supraclavicular or axillary lymph nodes. Resp: Distant breath sounds. No respiratory distress. Cardio: Regular rate and rhythm. GI: No hepatomegaly. Vascular: Trace to 1+ pitting edema at the lower legs bilaterally. Skin: Scattered ecchymoses over the forearms. Port-A-Cath without erythema.    Lab Results:  Lab Results  Component Value Date   WBC 5.9 09/04/2017   HGB 9.6 (L) 09/04/2017   HCT 29.3 (L) 09/04/2017   MCV 95.6 09/04/2017   PLT 162 09/04/2017   NEUTROABS 3.8 09/04/2017    Imaging:  No results found.  Medications: I have reviewed the patient's current medications.  Assessment/Plan: 1. 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  Status post cycle 3 high-dose araC consolidation 08/22/2014.  Bone marrow 09/15/2014 showed necrotic marrow with minute focus of myeloid precursors. No evidence of viable acute leukemia. 2. Pancytopenia secondary to chemotherapy. Improved.  3. C. difficile colitis-06/09/2014  treated with Flagyl  4. Diabetes  5. renal insufficiency  6. history of hypokalemia and hypomagnesemia. 7. history of atrial fibrillation  9. history of coronary artery disease  10. Hospitalization with febrile neutropenia/sepsis secondary to Escherichia coli bacteremia 09/02/2014. 11. Shingles involving the right back October 2015. 12. Meningioma on brain CT 05/24/2014 and brain MRI 05/24/2014 13. Hospitalization 08/24/2017 through 08/28/2017 with acute CHF.   Disposition: Ms. Mooradian remains in clinical remission from AML. She is scheduled to see Dr. Florene Glen and have a Port-A-Cath flush 10/23/2017. We will see her in follow-up with a Port-A-Cath flush 12/03/2017.  We will contact cardiology regarding follow-up of the CHF.  Plan reviewed with Dr. [name].    Ned Card ANP/GNP-BC   09/04/2017  3:38 PM

## 2017-09-04 NOTE — Progress Notes (Signed)
Appointment with Cardiology is scheduled for October 10 at 130pm. Patient has been advised.

## 2017-09-04 NOTE — Telephone Encounter (Signed)
Gave avs and calendar for december °

## 2017-09-07 NOTE — Telephone Encounter (Signed)
Called and spoke with pt because insulin is still in the refrigerator. Pt states she will stop by tomorrow on 09-08-18

## 2017-09-08 ENCOUNTER — Telehealth: Payer: Self-pay

## 2017-09-08 ENCOUNTER — Encounter: Payer: Self-pay | Admitting: Internal Medicine

## 2017-09-08 ENCOUNTER — Ambulatory Visit (INDEPENDENT_AMBULATORY_CARE_PROVIDER_SITE_OTHER): Payer: Medicare HMO | Admitting: Internal Medicine

## 2017-09-08 VITALS — BP 110/60 | HR 58 | Temp 97.7°F | Wt 224.0 lb

## 2017-09-08 DIAGNOSIS — E0821 Diabetes mellitus due to underlying condition with diabetic nephropathy: Secondary | ICD-10-CM | POA: Diagnosis not present

## 2017-09-08 DIAGNOSIS — E538 Deficiency of other specified B group vitamins: Secondary | ICD-10-CM

## 2017-09-08 DIAGNOSIS — Z794 Long term (current) use of insulin: Secondary | ICD-10-CM

## 2017-09-08 DIAGNOSIS — J410 Simple chronic bronchitis: Secondary | ICD-10-CM

## 2017-09-08 DIAGNOSIS — I5021 Acute systolic (congestive) heart failure: Secondary | ICD-10-CM

## 2017-09-08 DIAGNOSIS — I10 Essential (primary) hypertension: Secondary | ICD-10-CM | POA: Diagnosis not present

## 2017-09-08 MED ORDER — CYANOCOBALAMIN 1000 MCG/ML IJ SOLN
1000.0000 ug | Freq: Once | INTRAMUSCULAR | Status: AC
  Administered 2017-09-08: 1000 ug via INTRAMUSCULAR

## 2017-09-08 MED ORDER — INSULIN LISPRO 100 UNIT/ML (KWIKPEN): PEN_INJECTOR | SUBCUTANEOUS | 11 refills | Status: DC

## 2017-09-08 MED ORDER — HYDROCODONE-ACETAMINOPHEN 5-325 MG PO TABS: 1.0000 | ORAL_TABLET | Freq: Four times a day (QID) | ORAL | 0 refills | Status: DC | PRN

## 2017-09-08 MED ORDER — INSULIN GLARGINE 300 UNIT/ML ~~LOC~~ SOPN: 24.0000 [IU] | PEN_INJECTOR | Freq: Every day | SUBCUTANEOUS | 6 refills | Status: DC

## 2017-09-08 NOTE — Progress Notes (Signed)
Subjective:    Patient ID: [name], female    DOB: Oct 21, 1941, 76 y.o.   MRN: [id number]  HPI  Admit date: 08/24/2017 Discharge date: 08/28/2017   Recommendations for Outpatient Follow-up:  Repeat BMET to assess electrolytes and renal function. Follow CBG's/diabetes and adjust hypoglycemic regimen. Check bp  And adjust antihypertensive regimen if needed  Follow volume status and further complaints of CP; patient to follow up with cardiology service after discharge. Check CBC to follow platelets and Hgb trend  Check Thyroid panel in 3 weeks   Discharge Diagnoses:  Principal Problem:   Acute CHF (congestive heart failure) (HCC) Active Problems:   Diabetes mellitus with renal complications (HCC)   Essential hypertension   Back pain   CKD (chronic kidney disease) stage 3, GFR 30-59 ml/min   AML (acute myeloblastic leukemia) (HCC)   Other pancytopenia (HCC)   COPD (chronic obstructive pulmonary disease) (HCC)   NSTEMI (non-ST elevated myocardial infarction) (HCC)   Acute on chronic combined systolic and diastolic CHF (congestive heart failure) ([place]) morbid Obesity   Discharge Condition: stable and improved. No CP and looking to go home. Will follow up with PCP in 10 days and will have also follow up with cardiology service after discharge.   Wt Readings from Last 3 Encounters:  09/08/17 224 lb (101.6 kg)  09/04/17 229 lb 12.8 oz (104.2 kg)  08/28/17 236 lb 12.8 oz (107.4 kg)     Lab Results  Component Value Date   HGBA1C 10.3 (H) 07/14/2017   76 year old patient who is seen today following a recent hospital discharge. Hospital records reviewed. She was admitted with acute heart failure.  She continues to do well following her discharge.  She is anxious to return to work.  She is scheduled for cardiology follow-up in 8 days She's had no recurrent chest pain  She has insulin requiring diabetes and oral medications have largely been discontinued.  She remains  on basal bolus insulin with sliding scale mealtime coverage Fasting blood sugars are generally in the 150 range.  She is on sliding scale mealtime coverage and only rarely through the day is blood sugar over 200 requiring 8 units  Her pulmonary status has been stable She has been seen and followed by oncology.  Due to AML in remission  Post hospital laboratory studies reviewed and revealed improved renal indices  Past Medical History:  Diagnosis Date  . acute myeloblastic leukemia (AML) dx'd 06/2014  . Acute pancreatitis   . Atrial flutter ([place])   . Blood transfusion without reported diagnosis 2015   Leukemia  . Chronic kidney disease, stage II (mild)   . Colon polyps 04/29/2010   TUBULAR ADENOMA AND A SERRATED ADENOMA  . COPD (chronic obstructive pulmonary disease) ([place])   . CORONARY ARTERY DISEASE 12/24/2007  . DIABETES MELLITUS, TYPE II 07/13/2007  . HYPERLIPIDEMIA 12/24/2007  . HYPERTENSION 07/13/2007  . NEPHROLITHIASIS, HX OF 12/24/2007  . Unspecified disease of pancreas      Social History   Social History  . Marital status: Married    Spouse name: N/A  . Number of children: N/A  . Years of education: N/A   Occupational History  . works in a lab     makes glasses   Social History Main Topics  . Smoking status: Former Smoker    Packs/day: 1.00    Years: 20.00    Types: Cigarettes    Quit date: 12/08/2000  . Smokeless tobacco: Never Used  . Alcohol use  No  . Drug use: No  . Sexual activity: Not on file   Other Topics Concern  . 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 ABDOMINAL HYSTERECTOMY  unknown  . CARDIAC CATHETERIZATION  2002  . CATARACT EXTRACTION Bilateral   . COLONOSCOPY  2011  . EXCISIONAL HEMORRHOIDECTOMY  unknown  . NASAL SINUS SURGERY  unknown  . POLYPECTOMY  2011    Family History  Problem Relation Age of Onset  . Cancer Father         throat ca  . COPD Father   . Cancer Sister        uterine ca and breast ca  . Cancer Brother        throat  . Congestive Heart Failure Mother   . Heart attack Maternal Grandmother   . Colon cancer Neg Hx     No Known Allergies  Current Outpatient Prescriptions on File Prior to Visit  Medication Sig Dispense Refill  . amLODipine (NORVASC) 5 MG tablet Take 1 tablet (5 mg total) by mouth daily. 90 tablet 1  . aspirin 81 MG chewable tablet Chew 1 tablet (81 mg total) by mouth daily. 30 tablet 1  . furosemide (LASIX) 40 MG tablet Take 1 tablet (40 mg total) by mouth daily. 90 tablet 1  . isosorbide mononitrate (IMDUR) 30 MG 24 hr tablet Take 1 tablet (30 mg total) by mouth daily. 30 tablet 1  . LORazepam (ATIVAN) 0.5 MG tablet Take 1 tablet (0.5 mg total) by mouth every 6 (six) hours as needed for anxiety. 90 tablet 5  . magnesium oxide (MAG-OX) 400 (241.3 Mg) MG tablet Take 2 tablets (800 mg total) by mouth 2 (two) times daily. 360 tablet 3  . metoprolol tartrate (LOPRESSOR) 50 MG tablet TAKE 1 TABLET (50 MG TOTAL) BY MOUTH 2 (TWO) TIMES DAILY. 180 tablet 1  . nitroGLYCERIN (NITROSTAT) 0.4 MG SL tablet Place 1 tablet (0.4 mg total) under the tongue every 5 (five) minutes as needed for chest pain. 90 tablet 1  . promethazine (PHENERGAN) 12.5 MG tablet Take 2 tablets (25 mg total) by mouth every 6 (six) hours as needed for nausea. 12 tablet 0  . simvastatin (ZOCOR) 20 MG tablet Take 1 tablet (20 mg total) by mouth every evening. 90 tablet 2  . budesonide-formoterol (SYMBICORT) 160-4.5 MCG/ACT inhaler Inhale 2 puffs into the lungs 2 (two) times daily. 1 Inhaler 5   Current Facility-Administered Medications on File Prior to Visit  Medication Dose Route Frequency Provider Last Rate Last Dose  . sodium chloride 0.9 % 1,000 mL with magnesium sulfate 2 g infusion   Intravenous Continuous Ladell Pier, MD   Stopped at 08/04/14 1441    BP 110/60 (BP Location: Left Arm, Patient Position:  Sitting, Cuff Size: Large)   Pulse (!) 58   Temp 97.7 F (36.5 C) (Oral)   Wt 224 lb (101.6 kg)   SpO2 95%   BMI 40.97 kg/m     Review of Systems  Constitutional: Negative.   HENT: Negative for congestion, dental problem, hearing loss, rhinorrhea, sinus pressure, sore throat and tinnitus.   Eyes: Negative for pain, discharge and visual disturbance.  Respiratory: Positive for shortness of breath. Negative for cough.   Cardiovascular: Negative for chest pain, palpitations and leg swelling.  Gastrointestinal: Negative for abdominal distention, abdominal pain, blood in stool, constipation, diarrhea, nausea  and vomiting.  Genitourinary: Negative for difficulty urinating, dysuria, flank pain, frequency, hematuria, pelvic pain, urgency, vaginal bleeding, vaginal discharge and vaginal pain.  Musculoskeletal: Negative for arthralgias, gait problem and joint swelling.  Skin: Negative for rash.  Neurological: Negative for dizziness, syncope, speech difficulty, weakness, numbness and headaches.  Hematological: Negative for adenopathy.  Psychiatric/Behavioral: Negative for agitation, behavioral problems and dysphoric mood. The patient is not nervous/anxious.        Objective:   Physical Exam  Constitutional: She is oriented to person, place, and time. She appears well-developed and well-nourished.   Weight stable at 224, blood pressure well controlled  HENT:  Head: Normocephalic.  Right Ear: External ear normal.  Left Ear: External ear normal.  Mouth/Throat: Oropharynx is clear and moist.  Eyes: Pupils are equal, round, and reactive to light. Conjunctivae and EOM are normal.  Neck: Normal range of motion. Neck supple. No thyromegaly present.  Cardiovascular: Normal rate, regular rhythm and intact distal pulses.   Murmur heard. Grade 2/6 systolic murmur  Pulmonary/Chest: Effort normal and breath sounds normal.  Abdominal: Soft. Bowel sounds are normal. She exhibits no mass. There is no  tenderness.  Musculoskeletal: Normal range of motion. She exhibits edema.  Trace pedal edema  Lymphadenopathy:    She has no cervical adenopathy.  Neurological: She is alert and oriented to person, place, and time.  Skin: Skin is warm and dry. No rash noted.  Psychiatric: She has a normal mood and affect. Her behavior is normal.          Assessment & Plan:   Acute on chronic systolic and diastolic heart failure.  Appears fairly well compensated Cardiology follow-up as scheduled next week.  Restricted salt diet encouraged Diabetes mellitus.  Under better control.  Will up titrate insulin therapy slightly.  Follow-up 2 months Essential hypertension, stable AML in remission Chronic back pain.  Vicodin refilled.  Patient aware that she will need chronic pain management  [name]

## 2017-09-08 NOTE — Patient Instructions (Signed)
Limit your sodium (Salt) intake  Check your weights daily and notify if any weight gain of greater than 3 pounds  Cardiology follow-up as scheduled  Return here in 2 months for follow-up  Discontinue glyburide  Increase bedtime insulin to 24 units  No change in mealtime sliding scale insulin

## 2017-09-08 NOTE — Telephone Encounter (Signed)
Pt picked up the 2 Kwik pen Humalog Insulin that was in the refrigerator on 09/08/2017

## 2017-09-10 ENCOUNTER — Ambulatory Visit: Payer: Medicare HMO | Admitting: Oncology

## 2017-09-10 ENCOUNTER — Other Ambulatory Visit: Payer: Medicare HMO

## 2017-09-11 ENCOUNTER — Other Ambulatory Visit: Payer: Self-pay | Admitting: *Deleted

## 2017-09-11 NOTE — Patient Outreach (Signed)
[facility]) Care Management  09/11/2017  [name] 1941-03-10 [id number]  EMMI-HF-Red Alert referral-Day#37, 09/09/2017;  Reason: Know why taking meds-no.  Call#2 to patient who gave HIPPA verification. States she is doing well & has started back to work.  States she takes her medications as prescribed & knows generally why she I taking all of her medications but sometimes can't single out what a specific pill is for if she does not have pill bottle close by. States she does not identify pill by color. States she takes medication from bottle & follow instructions on how to take as noted on bottle. Medication review was done with patient & she was able to state why she was taking 90% of the time when generic name was called out.  Patient voices that she continues to weigh daily & has not gained weight but also has not lost any. Voices understanding of knowing when to call MD as it relates to gaining weight. States she is really looking at labels & cutting down on sodium intake.   Patient voices no further concerns. EMMI call has been completed.   Plan: Send to care management assistant to close out  Sherrin Daisy, RN BSN [place] Management Coordinator [facility] Care Management  [phone]

## 2017-09-15 NOTE — Progress Notes (Signed)
Cardiology Office Note:    Date:  09/16/2017   ID:  [name], DOB [birth year], MRN [id number]  PCP:  Marletta Lor, MD  Cardiologist:  Dr. Acie Fredrickson   Referring MD: Marletta Lor, MD   Chief Complaint  Patient presents with  . Hospitalization Follow-up    NSTEMI    History of Present Illness:    [name] is a 76 y.o. female with a past medical history significant for Nonobstructive CAD, hypertension, hyperlipidemia, AML in remission since 2015 treated with doxurubicin, CKD III-IV Ace line CR 1.7, chronic anemia, moderate COPD.   The patient has a history of cardiac cath in 2002 showing patent left main, LAD with diffuse 20%, left circumflex patent, RCA ostial 50% with dampening of pressures. She was managed medically. She was seen in 01/2014 by Dr. Harl Bowie and she was admitted for chest pain that was felt to be atypical. She was noted to have atrial flutter at the time, but given a new diagnosis of pancreatic mass and evidence of cirrhosis by imaging, anticoagulation was deferred. A Lexiscan was negative for ischemia at that time.  On 08/24/17 [name] presented to the [facility] emergency department with progressive worsening dyspnea on exertion. She also noted a burning chest discomfort that was relieved with sitting up and with nitroglycerin. She also had orthopnea and lower extremity edema. BNP was 357 and echo showed newly reduced LVEF of 40-45% with apical hypokinesis. Her max troponin level was 0.47 and 1/2 blood culture was positive. She was diuresed and her serum creatinine increased to 2.05. The need for further cardiac evaluation was discussed with the patient including cardiac catheterization that may worsen her renal function. The patient had been on dialysis once before when she had chemotherapy and did not want to take the chance of needing dialysis again. It was decided to manage the patient with maximal medical therapy and hold off on doing a heart  catheterization unless she failed medical therapy.  Today the patient is here for follow up with her husband. She is complaining of continued dyspnea on minimal exertion. She became short of breath walking in from the parking lot and had to use a wheelchair to get up to the office. She gets short of breath just walking through her house. She still longer having chest burning that she is having occasional tightness across her upper shoulders unrelated to activity. She has chronic lower extremity edema for the last 6 months which is currently present but no worse than her usual. She has no cough or wheezing. Posthospitalization labs showed an improved renal function with serum creatinine of 1.4 and the patient says that she is interested in cardiac catheterization to further definitively evaluate her cardiac status.  Past Medical History:  Diagnosis Date  . acute myeloblastic leukemia (AML) dx'd 06/2014  . Acute pancreatitis   . Atrial flutter ([place])   . Blood transfusion without reported diagnosis 2015   Leukemia  . Chronic kidney disease, stage II (mild)   . Colon polyps 04/29/2010   TUBULAR ADENOMA AND A SERRATED ADENOMA  . COPD (chronic obstructive pulmonary disease) ([place])   . CORONARY ARTERY DISEASE 12/24/2007  . DIABETES MELLITUS, TYPE II 07/13/2007  . HYPERLIPIDEMIA 12/24/2007  . HYPERTENSION 07/13/2007  . NEPHROLITHIASIS, HX OF 12/24/2007  . Unspecified disease of pancreas     Past Surgical History:  Procedure Laterality Date  . ABDOMINAL HYSTERECTOMY  unknown  . CARDIAC CATHETERIZATION  2002  . CATARACT EXTRACTION Bilateral   .  COLONOSCOPY  2011  . EXCISIONAL HEMORRHOIDECTOMY  unknown  . NASAL SINUS SURGERY  unknown  . POLYPECTOMY  2011    Current Medications: Current Meds  Medication Sig  . amLODipine (NORVASC) 5 MG tablet Take 1 tablet (5 mg total) by mouth daily.  Marland Kitchen aspirin 81 MG chewable tablet Chew 1 tablet (81 mg total) by mouth daily.  . budesonide-formoterol (SYMBICORT)  160-4.5 MCG/ACT inhaler Inhale 2 puffs into the lungs 2 (two) times daily.  . furosemide (LASIX) 40 MG tablet Take 1 tablet (40 mg total) by mouth daily.  Marland Kitchen HYDROcodone-acetaminophen (NORCO/VICODIN) 5-325 MG tablet Take 1 tablet by mouth every 6 (six) hours as needed for moderate pain.  . Insulin Glargine (TOUJEO SOLOSTAR) 300 UNIT/ML SOPN Inject 24 Units into the skin at bedtime.  . insulin lispro (HUMALOG) 100 UNIT/ML KiwkPen Blood sugar less than 70- no insulin Blood sugar 71-150- 6 units Blood sugar 151- 200- 8 units Blood sugar over 200-  10 units  . isosorbide mononitrate (IMDUR) 30 MG 24 hr tablet Take 1 tablet (30 mg total) by mouth daily.  Marland Kitchen LORazepam (ATIVAN) 0.5 MG tablet Take 1 tablet (0.5 mg total) by mouth every 6 (six) hours as needed for anxiety.  . magnesium oxide (MAG-OX) 400 (241.3 Mg) MG tablet Take 2 tablets (800 mg total) by mouth 2 (two) times daily.  . metoprolol tartrate (LOPRESSOR) 50 MG tablet TAKE 1 TABLET (50 MG TOTAL) BY MOUTH 2 (TWO) TIMES DAILY.  . nitroGLYCERIN (NITROSTAT) 0.4 MG SL tablet Place 1 tablet (0.4 mg total) under the tongue every 5 (five) minutes as needed for chest pain.  . promethazine (PHENERGAN) 12.5 MG tablet Take 2 tablets (25 mg total) by mouth every 6 (six) hours as needed for nausea.  . simvastatin (ZOCOR) 20 MG tablet Take 1 tablet (20 mg total) by mouth every evening.     Allergies:   Patient has no known allergies.   Social History   Social History  . Marital status: Married    Spouse name: N/A  . Number of children: N/A  . Years of education: N/A   Occupational History  . works in a lab     makes glasses   Social History Main Topics  . Smoking status: Former Smoker    Packs/day: 1.00    Years: 20.00    Types: Cigarettes    Quit date: 12/08/2000  . Smokeless tobacco: Never Used  . Alcohol use No  . Drug use: No  . Sexual activity: Not Asked   Other Topics Concern  . 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 There is no history of Colon cancer. ROS:   Please see the history of present illness.     All other systems reviewed and are negative.  EKGs/Labs/Other Studies Reviewed:    The following studies were reviewed today:  Echocardiogram 08/24/17 Study Conclusions - Left ventricle: The cavity size was normal. There was moderate   concentric hypertrophy. Systolic function was mildly to   moderately reduced. The estimated ejection fraction was in the   range of 40% to 45%. Hypokinesis of all mid-apical segments.   Apical akinesis. - Aortic valve: Transvalvular velocity was within the normal range.   There was no stenosis. There was  no regurgitation. - Mitral valve: Mild thickening and calcification. Transvalvular   velocity was within the normal range. There was no evidence for   stenosis. There was trivial regurgitation. - Right ventricle: The cavity size was normal. Wall thickness was   normal. - Tricuspid valve: There was trivial regurgitation. - Pulmonary arteries: Systolic pressure was moderately increased.   PA peak pressure: 51 mm Hg (S).  EKG:  EKG is  ordered today.  The ekg ordered today demonstrates sinus rhythm with first-degree AV block, PR are 284, T-wave inversions that are fairly diffuse and new compared to previous EKG  Recent Labs: 12/05/2016: Magnesium 2.0 08/24/2017: B Natriuretic Peptide 357.9; TSH 6.197 09/04/2017: ALT 15; BUN 20.6; Creatinine 1.4; HGB 9.6; Platelets 162; Potassium 4.1; Sodium 144   Recent Lipid Panel    Component Value Date/Time   CHOL 170 10/05/2015 1711   TRIG 401 (H) 10/05/2015 1711   HDL 37 (L) 10/05/2015 1711   CHOLHDL 4.6 10/05/2015 1711   VLDL NOT CALC 10/05/2015 1711   LDLCALC NOT CALC 10/05/2015  1711    Physical Exam:    VS:  BP (!) 122/40   Pulse 73   Ht 5\' 2"  (1.575 m)   Wt 228 lb (103.4 kg)   SpO2 95%   BMI 41.70 kg/m     Wt Readings from Last 3 Encounters:  09/16/17 228 lb (103.4 kg)  09/08/17 224 lb (101.6 kg)  09/04/17 229 lb 12.8 oz (104.2 kg)     Physical Exam  Constitutional: She is oriented to person, place, and time. She appears well-developed and well-nourished. No distress.  Obese female  HENT:  Head: Normocephalic and atraumatic.  Neck: Normal range of motion. Neck supple. No JVD present.  Cardiovascular: Normal rate, regular rhythm and normal heart sounds.  Exam reveals no gallop and no friction rub.   No murmur heard. Pulmonary/Chest: Breath sounds normal. No respiratory distress. She has no wheezes. She has no rales.  Slightly increase effort with minimal activity like talking  And moving in exam room.   Abdominal: Soft. Bowel sounds are normal.  Central obesity  Musculoskeletal: Normal range of motion. She exhibits edema. She exhibits no deformity.  1-2+ lower leg pitting edema  Neurological: She is alert and oriented to person, place, and time.  Skin: Skin is warm and dry.  Psychiatric: She has a normal mood and affect. Her behavior is normal. Thought content normal.     ASSESSMENT:    1. NSTEMI (non-ST elevated myocardial infarction) ([place])   2. Acute systolic congestive heart failure ([place])   3. CKD (chronic kidney disease) stage 4, GFR 15-29 ml/min (HCC)   4. Essential hypertension   5. Hyperlipidemia, unspecified hyperlipidemia type   6. Shortness of breath   7. Chest pain, unspecified type    PLAN:    In order of problems listed above:  S/P NSTEMI:  The patient was admitted to the hospital with worsening shortness of breath and burning in the chest 9/17. Troponin was mildly elevated at 0.47. An echo showed reduced EF 40% to 45% with RWMAs. Due to impaired renal function it was decided to avoid invasive cardiac procedures and optimize  medical therapy. Isosorbide was added. Today the patient denies chest burning, however she still continues to have significant dyspnea on exertion with minimal activity and occasional tightness across her shoulders. Her creatinine has returned to better than baseline at 1.4. In the hospital Dr. Acie Fredrickson had discussed with the patient the possibility of a cardiac  cath if her renal function improved and currently the patient would like to proceed. I have spoken to Dr.Nahser and conveyed the patient's continued complaints of dyspnea.   -We will arrange for cardiac catheterization to be done tomorrow.   -She will arrive early and receive gentle hydration for approximately 3 hours prior to her cath.   -I will have her labs checked today. (Scr today is 1.46)  -I will request limited contrast to be used with her catheterization and she should be considered for staged PCI if needed, minimizing the risk of contrast-induced nephropathy.          Cardiac catheterization has been arranged for tomorrow. The patient understands that risks included but are not limited to stroke (1 in 1000), death (1 in 100), kidney failure [usually temporary] (1 in 500), bleeding (1 in 200), allergic reaction [possibly serious] (1 in 200). She wishes to proceed.                                                                                   Acute on chronic combined heart failure:  Echocardiogram showed EF 40-45%. BNP was 357.9. The patient was diuresed in the hospital as allowed by renal function. She was discharged on oral Lasix. Initially her breathing was mildly improved after the hospital however her shortness of breath has worsened. She has lower extremity edema which has been present for about the last 6 months. Plan for right and left heart cath tomorrow to assess filling pressures and possible ischemic cause of reduced EF.  Acute on chronic CKD stage III-IV: Baseline creatinine is around 1.7. Creatinine was elevated in the  hospital up to 2.35. Follow-up labs on 9/28 showed creatinine of 1.4. Lisinopril is currently on hold. We'll continue to hold until after cardiac catheterization.  Hypertension: Blood pressure is currently well controlled  Hyperlipidemia:  Continue statin. May need to be switched to high intensity statin if cardiac cath reveals CAD.   Diabetes on insulin:  Hgb A1c 10.3 on 07/14/17. Pt needs better blood sugar control. Management per IM. Sine pt will be NPO after midnight, she is advised to only take 1/2 dose of insulin tonight and hold am Novolog.    Medication Adjustments/Labs and Tests Ordered: Current medicines are reviewed at length with the patient today.  Concerns regarding medicines are outlined above. Labs and tests ordered and medication changes are outlined in the patient instructions below:  Patient Instructions  Medication Instructions:  Your physician recommends that you continue on your current medications as directed. Please refer to the Current Medication list given to you today.  Labwork: TODAY BMET, CBC, PT/INR  Testing/Procedures: Your physician has requested that you have a cardiac catheterization. Cardiac catheterization is used to diagnose and/or treat various heart conditions. Doctors may recommend this procedure for a number of different reasons. The most common reason is to evaluate chest pain. Chest pain can be a symptom of coronary artery disease (CAD), and cardiac catheterization can show whether plaque is narrowing or blocking your heart's arteries. This procedure is also used to evaluate the valves, as well as measure the blood flow and oxygen levels in different parts of your heart. For  further information please visit HugeFiesta.tn. Please follow instruction sheet, as given.    Follow-Up: 10/06/17 @ 3:15 WITH SCOTT WEAVER, PAC ; THIS IS YOUR POST PROCEDURE FOLLOW UP   Any Other Special Instructions Will Be Listed Below (If Applicable). If you need a  refill on your cardiac medications before your next appointment, please call your pharmacy.    [place] OFFICE [address] [place] [address] Dept: [phone] Loc: [phone]  [name]  09/16/2017  You are scheduled for a Cardiac Catheterization on Thursday, October 11 with Dr. Harrell Gave End.  1. Please arrive at the [facility] (Main Entrance A) at [facility]: [place], [place] [address] at 10:30 AM (two hours before your procedure to ensure your preparation). Free valet parking service is available.   Special note: Every effort is made to have your procedure done on time. Please understand that emergencies sometimes delay scheduled procedures.  2. Diet: Do not eat or drink anything after midnight prior to your procedure except sips of water to take medications.  3. Labs: You will need to have blood drawn on Wednesday, October 10 at [facility] at [facility]. [address]  Open: 7:30am - 5pm    Phone: [phone]. You do not need to be fasting.  4. Medication instructions in preparation for your procedure: INSULIN INSTRUCTIONS:  1. TAKE 1/2 DOSE TONIGHT 09/16/17 OF TOUJEO 2. TOMORROW MORNING 09/17/17 YOU WILL NOT TAKE YOUR MORNING HUMALOG.    OTHER MEDICATION DIRECTIONS: MAKE SURE YOU TAKE YOU IMDUR AND METOPROLOL AND AMLODIPINE TOMORROW MORNING   On the morning of your procedure, take your Aspirin 81 MG THE MORNING OF CATH.  You may use sips of water.  5. Plan for one night stay--bring personal belongings. 6. Bring a current list of your medications and current insurance cards. 7. You MUST have a responsible person to drive you home. 8. Someone MUST be with you the first 24 hours after you arrive home or your discharge will be delayed. 9. Please wear clothes that are easy to get on and off and wear slip-on  shoes.  Thank you for allowing [place] to care for you!   -- [facility] Invasive Cardiovascular services     Signed, Daune Perch, NP  09/16/2017 3:11 PM    [place]  Attending Note:   The patient was seen and examined.  Agree with assessment and plan as noted above.  Changes made to the above note as needed.  Patient seen and independently examined with Pecolia Ades, NP.   We discussed all aspects of the encounter. I agree with the assessment and plan as stated above.  1.  Unstable angina Agree with plans for cath . We have discussed risks, benefits, options.  She understands and agrees to proceed     I have spent a total of 40 minutes with patient reviewing hospital  notes , telemetry, EKGs, labs and examining patient as well as establishing an assessment and plan that was discussed with the patient. > 50% of time was spent in direct patient care.    Thayer Headings, Brooke Bonito., MD, [facility] 09/18/2017, 3:26 PM [address],  [place] Pager [phone]

## 2017-09-16 ENCOUNTER — Telehealth: Payer: Self-pay | Admitting: *Deleted

## 2017-09-16 ENCOUNTER — Ambulatory Visit (INDEPENDENT_AMBULATORY_CARE_PROVIDER_SITE_OTHER): Payer: Medicare HMO | Admitting: Cardiology

## 2017-09-16 ENCOUNTER — Other Ambulatory Visit: Payer: Self-pay | Admitting: Cardiology

## 2017-09-16 ENCOUNTER — Encounter (INDEPENDENT_AMBULATORY_CARE_PROVIDER_SITE_OTHER): Payer: Self-pay

## 2017-09-16 ENCOUNTER — Encounter: Payer: Self-pay | Admitting: Physician Assistant

## 2017-09-16 VITALS — BP 122/40 | HR 73 | Ht 62.0 in | Wt 228.0 lb

## 2017-09-16 DIAGNOSIS — R0602 Shortness of breath: Secondary | ICD-10-CM

## 2017-09-16 DIAGNOSIS — E785 Hyperlipidemia, unspecified: Secondary | ICD-10-CM

## 2017-09-16 DIAGNOSIS — R079 Chest pain, unspecified: Secondary | ICD-10-CM

## 2017-09-16 DIAGNOSIS — I10 Essential (primary) hypertension: Secondary | ICD-10-CM

## 2017-09-16 DIAGNOSIS — I5021 Acute systolic (congestive) heart failure: Secondary | ICD-10-CM

## 2017-09-16 DIAGNOSIS — I214 Non-ST elevation (NSTEMI) myocardial infarction: Secondary | ICD-10-CM

## 2017-09-16 DIAGNOSIS — N184 Chronic kidney disease, stage 4 (severe): Secondary | ICD-10-CM

## 2017-09-16 LAB — PROTIME-INR
INR: 0.9 (ref 0.8–1.2)
PROTHROMBIN TIME: 10.1 s (ref 9.1–12.0)

## 2017-09-16 LAB — BASIC METABOLIC PANEL
BUN/Creatinine Ratio: 10 — ABNORMAL LOW (ref 12–28)
BUN: 15 mg/dL (ref 8–27)
CHLORIDE: 103 mmol/L (ref 96–106)
CO2: 29 mmol/L (ref 20–29)
CREATININE: 1.46 mg/dL — AB (ref 0.57–1.00)
Calcium: 9.1 mg/dL (ref 8.7–10.3)
GFR calc Af Amer: 40 mL/min/{1.73_m2} — ABNORMAL LOW (ref >59)
GFR calc non Af Amer: 35 mL/min/{1.73_m2} — ABNORMAL LOW (ref >59)
GLUCOSE: 292 mg/dL — AB (ref 65–99)
POTASSIUM: 4 mmol/L (ref 3.5–5.2)
SODIUM: 139 mmol/L (ref 134–144)

## 2017-09-16 LAB — CBC
Hematocrit: 30 % — ABNORMAL LOW (ref 34.0–46.6)
Hemoglobin: 10 g/dL — ABNORMAL LOW (ref 11.1–15.9)
MCH: 30.5 pg (ref 26.6–33.0)
MCHC: 33.3 g/dL (ref 31.5–35.7)
MCV: 92 fL (ref 79–97)
PLATELETS: 154 10*3/uL (ref 150–379)
RBC: 3.28 x10E6/uL — AB (ref 3.77–5.28)
RDW: 13.6 % (ref 12.3–15.4)
WBC: 5.8 10*3/uL (ref 3.4–10.8)

## 2017-09-16 NOTE — Telephone Encounter (Signed)
Pt has been notified of lab results and findings by phone with verbal understanding. Pt aware glucose high 292 and A1c from August was over 10. Advised she may need to f/u with PCP for medication adjustment of her DM medications. Pt agreeable to plan of care and is aware she is ok to proceed with her cath tomorrow 09/17/17. Pt thanked me for my call. Will forward results to Dr. Inda Merlin.

## 2017-09-16 NOTE — Telephone Encounter (Signed)
-----   Message from Daune Perch, NP sent at 09/16/2017  4:57 PM EDT ----- Please let pt  Know that her labs today look pretty good. Her kidney function is stable and OK to proceed with cath tomorrow. Her glucose is elevated at 292 and her A1c in August was over 10. She should work on better blood sugar control. May need to see her PCP for medication adjustment if her current insulin regimen is not controlling her blood sugar (after her cath).  Daune Perch, AGNP-C [facility] 09/16/2017  4:57 PM

## 2017-09-16 NOTE — Patient Instructions (Addendum)
Medication Instructions:  Your physician recommends that you continue on your current medications as directed. Please refer to the Current Medication list given to you today.  Labwork: TODAY BMET, CBC, PT/INR  Testing/Procedures: Your physician has requested that you have a cardiac catheterization. Cardiac catheterization is used to diagnose and/or treat various heart conditions. Doctors may recommend this procedure for a number of different reasons. The most common reason is to evaluate chest pain. Chest pain can be a symptom of coronary artery disease (CAD), and cardiac catheterization can show whether plaque is narrowing or blocking your heart's arteries. This procedure is also used to evaluate the valves, as well as measure the blood flow and oxygen levels in different parts of your heart. For further information please visit HugeFiesta.tn. Please follow instruction sheet, as given.    Follow-Up: 10/06/17 @ 3:15 WITH SCOTT WEAVER, PAC ; THIS IS YOUR POST PROCEDURE FOLLOW UP   Any Other Special Instructions Will Be Listed Below (If Applicable). If you need a refill on your cardiac medications before your next appointment, please call your pharmacy.    [place] OFFICE [address] [place] [address] Dept: [phone] Loc: [phone]  [name]  09/16/2017  You are scheduled for a Cardiac Catheterization on Thursday, October 11 with Dr. Harrell Gave End.  1. Please arrive at the [facility] (Main Entrance A) at [facility]: [place], [place] [address] at 10:30 AM (two hours before your procedure to ensure your preparation). Free valet parking service is available.   Special note: Every effort is made to have your procedure done on time. Please understand that emergencies sometimes delay scheduled procedures.  2. Diet: Do not eat or drink anything after  midnight prior to your procedure except sips of water to take medications.  3. Labs: You will need to have blood drawn on Wednesday, October 10 at [facility] at [facility]. [address]  Open: 7:30am - 5pm    Phone: [phone]. You do not need to be fasting.  4. Medication instructions in preparation for your procedure: INSULIN INSTRUCTIONS:  1. TAKE 1/2 DOSE TONIGHT 09/16/17 OF TOUJEO 2. TOMORROW MORNING 09/17/17 YOU WILL NOT TAKE YOUR MORNING HUMALOG.    OTHER MEDICATION DIRECTIONS: MAKE SURE YOU TAKE YOU IMDUR AND METOPROLOL AND AMLODIPINE TOMORROW MORNING   On the morning of your procedure, take your Aspirin 81 MG THE MORNING OF CATH.  You may use sips of water.  5. Plan for one night stay--bring personal belongings. 6. Bring a current list of your medications and current insurance cards. 7. You MUST have a responsible person to drive you home. 8. Someone MUST be with you the first 24 hours after you arrive home or your discharge will be delayed. 9. Please wear clothes that are easy to get on and off and wear slip-on shoes.  Thank you for allowing [place] to care for you!   -- [place] Invasive Cardiovascular services

## 2017-09-17 ENCOUNTER — Encounter (HOSPITAL_COMMUNITY): Payer: Self-pay | Admitting: Internal Medicine

## 2017-09-17 ENCOUNTER — Inpatient Hospital Stay (HOSPITAL_COMMUNITY)
Admission: AD | Admit: 2017-09-17 | Discharge: 2017-09-22 | DRG: 246 | Disposition: A | Discharge status: Home / Self Care | Payer: Medicare HMO | Source: Ambulatory Visit | Attending: Internal Medicine | Admitting: Internal Medicine

## 2017-09-17 ENCOUNTER — Encounter (HOSPITAL_COMMUNITY)
Admission: AD | Disposition: A | Discharge status: Home / Self Care | Payer: Self-pay | Source: Ambulatory Visit | Attending: Internal Medicine

## 2017-09-17 DIAGNOSIS — Z7982 Long term (current) use of aspirin: Secondary | ICD-10-CM | POA: Diagnosis not present

## 2017-09-17 DIAGNOSIS — C9201 Acute myeloblastic leukemia, in remission: Secondary | ICD-10-CM | POA: Diagnosis present

## 2017-09-17 DIAGNOSIS — I5042 Chronic combined systolic (congestive) and diastolic (congestive) heart failure: Secondary | ICD-10-CM | POA: Diagnosis present

## 2017-09-17 DIAGNOSIS — Z79899 Other long term (current) drug therapy: Secondary | ICD-10-CM

## 2017-09-17 DIAGNOSIS — N183 Chronic kidney disease, stage 3 (moderate): Secondary | ICD-10-CM | POA: Diagnosis not present

## 2017-09-17 DIAGNOSIS — E1122 Type 2 diabetes mellitus with diabetic chronic kidney disease: Secondary | ICD-10-CM | POA: Diagnosis present

## 2017-09-17 DIAGNOSIS — N186 End stage renal disease: Secondary | ICD-10-CM | POA: Diagnosis not present

## 2017-09-17 DIAGNOSIS — I5043 Acute on chronic combined systolic (congestive) and diastolic (congestive) heart failure: Secondary | ICD-10-CM

## 2017-09-17 DIAGNOSIS — I2510 Atherosclerotic heart disease of native coronary artery without angina pectoris: Secondary | ICD-10-CM | POA: Diagnosis not present

## 2017-09-17 DIAGNOSIS — E039 Hypothyroidism, unspecified: Secondary | ICD-10-CM | POA: Diagnosis present

## 2017-09-17 DIAGNOSIS — Z9842 Cataract extraction status, left eye: Secondary | ICD-10-CM | POA: Diagnosis not present

## 2017-09-17 DIAGNOSIS — E669 Obesity, unspecified: Secondary | ICD-10-CM | POA: Diagnosis present

## 2017-09-17 DIAGNOSIS — I255 Ischemic cardiomyopathy: Secondary | ICD-10-CM | POA: Diagnosis present

## 2017-09-17 DIAGNOSIS — I130 Hypertensive heart and chronic kidney disease with heart failure and stage 1 through stage 4 chronic kidney disease, or unspecified chronic kidney disease: Principal | ICD-10-CM | POA: Diagnosis present

## 2017-09-17 DIAGNOSIS — Z6841 Body Mass Index (BMI) 40.0 and over, adult: Secondary | ICD-10-CM

## 2017-09-17 DIAGNOSIS — I509 Heart failure, unspecified: Secondary | ICD-10-CM

## 2017-09-17 DIAGNOSIS — I4892 Unspecified atrial flutter: Secondary | ICD-10-CM | POA: Diagnosis not present

## 2017-09-17 DIAGNOSIS — I214 Non-ST elevation (NSTEMI) myocardial infarction: Secondary | ICD-10-CM

## 2017-09-17 DIAGNOSIS — D631 Anemia in chronic kidney disease: Secondary | ICD-10-CM | POA: Diagnosis not present

## 2017-09-17 DIAGNOSIS — Z955 Presence of coronary angioplasty implant and graft: Secondary | ICD-10-CM

## 2017-09-17 DIAGNOSIS — Z87891 Personal history of nicotine dependence: Secondary | ICD-10-CM | POA: Diagnosis not present

## 2017-09-17 DIAGNOSIS — Z794 Long term (current) use of insulin: Secondary | ICD-10-CM

## 2017-09-17 DIAGNOSIS — I252 Old myocardial infarction: Secondary | ICD-10-CM | POA: Diagnosis not present

## 2017-09-17 DIAGNOSIS — E785 Hyperlipidemia, unspecified: Secondary | ICD-10-CM | POA: Diagnosis present

## 2017-09-17 DIAGNOSIS — J449 Chronic obstructive pulmonary disease, unspecified: Secondary | ICD-10-CM | POA: Diagnosis present

## 2017-09-17 DIAGNOSIS — Z9841 Cataract extraction status, right eye: Secondary | ICD-10-CM

## 2017-09-17 DIAGNOSIS — R0602 Shortness of breath: Secondary | ICD-10-CM | POA: Diagnosis present

## 2017-09-17 DIAGNOSIS — I25110 Atherosclerotic heart disease of native coronary artery with unstable angina pectoris: Secondary | ICD-10-CM | POA: Diagnosis not present

## 2017-09-17 DIAGNOSIS — I200 Unstable angina: Secondary | ICD-10-CM | POA: Diagnosis present

## 2017-09-17 DIAGNOSIS — Z992 Dependence on renal dialysis: Secondary | ICD-10-CM | POA: Diagnosis not present

## 2017-09-17 HISTORY — DX: Personal history of other medical treatment: Z92.89

## 2017-09-17 HISTORY — DX: Non-ST elevation (NSTEMI) myocardial infarction: I21.4

## 2017-09-17 HISTORY — DX: Low back pain, unspecified: M54.50

## 2017-09-17 HISTORY — DX: Low back pain: M54.5

## 2017-09-17 HISTORY — DX: Malignant neoplasm of uterus, part unspecified: C55

## 2017-09-17 HISTORY — DX: Personal history of urinary calculi: Z87.442

## 2017-09-17 HISTORY — DX: Other chronic pain: G89.29

## 2017-09-17 HISTORY — DX: Cardiomyopathy, unspecified: I42.9

## 2017-09-17 HISTORY — PX: INTRAVASCULAR PRESSURE WIRE/FFR STUDY: CATH118243

## 2017-09-17 HISTORY — PX: CARDIAC CATHETERIZATION: SHX172

## 2017-09-17 HISTORY — PX: RIGHT/LEFT HEART CATH AND CORONARY ANGIOGRAPHY: CATH118266

## 2017-09-17 HISTORY — DX: Unspecified osteoarthritis, unspecified site: M19.90

## 2017-09-17 LAB — POCT I-STAT 3, ART BLOOD GAS (G3+)
ACID-BASE EXCESS: 2 mmol/L (ref 0.0–2.0)
BICARBONATE: 26.9 mmol/L (ref 20.0–28.0)
O2 Saturation: 90 %
PH ART: 7.399 (ref 7.350–7.450)
TCO2: 28 mmol/L (ref 22–32)
pCO2 arterial: 43.5 mmHg (ref 32.0–48.0)
pO2, Arterial: 60 mmHg — ABNORMAL LOW (ref 83.0–108.0)

## 2017-09-17 LAB — POCT ACTIVATED CLOTTING TIME: Activated Clotting Time: 252 seconds

## 2017-09-17 LAB — POCT I-STAT 3, VENOUS BLOOD GAS (G3P V)
ACID-BASE EXCESS: 2 mmol/L (ref 0.0–2.0)
Bicarbonate: 27.7 mmol/L (ref 20.0–28.0)
O2 SAT: 54 %
PCO2 VEN: 48.9 mmHg (ref 44.0–60.0)
PO2 VEN: 30 mmHg — AB (ref 32.0–45.0)
TCO2: 29 mmol/L (ref 22–32)
pH, Ven: 7.362 (ref 7.250–7.430)

## 2017-09-17 LAB — GLUCOSE, CAPILLARY
Glucose-Capillary: 323 mg/dL — ABNORMAL HIGH (ref 65–99)
Glucose-Capillary: 174 mg/dL — ABNORMAL HIGH (ref 65–99)
Glucose-Capillary: 257 mg/dL — ABNORMAL HIGH (ref 65–99)

## 2017-09-17 SURGERY — RIGHT/LEFT HEART CATH AND CORONARY ANGIOGRAPHY
Anesthesia: LOCAL

## 2017-09-17 MED ORDER — ASPIRIN 81 MG PO CHEW: 81.0000 mg | CHEWABLE_TABLET | ORAL | Status: DC

## 2017-09-17 MED ORDER — VERAPAMIL HCL 2.5 MG/ML IV SOLN
INTRAVENOUS | Status: DC | PRN
  Administered 2017-09-17: 15:00:00 via INTRA_ARTERIAL

## 2017-09-17 MED ORDER — ACETAMINOPHEN 325 MG PO TABS
650.0000 mg | ORAL_TABLET | ORAL | Status: DC | PRN
  Administered 2017-09-19: 650 mg via ORAL
  Filled 2017-09-17: qty 2

## 2017-09-17 MED ORDER — LORAZEPAM 0.5 MG PO TABS
0.5000 mg | ORAL_TABLET | Freq: Four times a day (QID) | ORAL | Status: DC | PRN
  Administered 2017-09-18 – 2017-09-21 (×4): 0.5 mg via ORAL
  Filled 2017-09-17 (×4): qty 1

## 2017-09-17 MED ORDER — IOPAMIDOL (ISOVUE-370) INJECTION 76%
INTRAVENOUS | Status: DC | PRN
  Administered 2017-09-17: 60 mL via INTRAVENOUS

## 2017-09-17 MED ORDER — SODIUM CHLORIDE 0.9% FLUSH: 3.0000 mL | Freq: Two times a day (BID) | INTRAVENOUS | Status: DC

## 2017-09-17 MED ORDER — INSULIN ASPART 100 UNIT/ML ~~LOC~~ SOLN
SUBCUTANEOUS | Status: AC
  Filled 2017-09-17: qty 1

## 2017-09-17 MED ORDER — INSULIN GLARGINE 100 UNIT/ML ~~LOC~~ SOLN
20.0000 [IU] | Freq: Every day | SUBCUTANEOUS | Status: DC
  Administered 2017-09-17 – 2017-09-18 (×2): 20 [IU] via SUBCUTANEOUS
  Filled 2017-09-17 (×3): qty 0.2

## 2017-09-17 MED ORDER — HEPARIN SODIUM (PORCINE) 1000 UNIT/ML IJ SOLN
INTRAMUSCULAR | Status: DC | PRN
Start: 2017-09-17 — End: 2017-09-17
  Administered 2017-09-17 (×2): 5000 [IU] via INTRAVENOUS

## 2017-09-17 MED ORDER — INSULIN ASPART 100 UNIT/ML ~~LOC~~ SOLN
5.0000 [IU] | Freq: Once | SUBCUTANEOUS | Status: AC
  Administered 2017-09-17: 5 [IU] via SUBCUTANEOUS

## 2017-09-17 MED ORDER — ADENOSINE 12 MG/4ML IV SOLN
INTRAVENOUS | Status: AC
  Filled 2017-09-17: qty 16

## 2017-09-17 MED ORDER — METOPROLOL TARTRATE 50 MG PO TABS
50.0000 mg | ORAL_TABLET | Freq: Two times a day (BID) | ORAL | Status: DC
  Administered 2017-09-17 – 2017-09-18 (×3): 50 mg via ORAL
  Filled 2017-09-17 (×3): qty 1

## 2017-09-17 MED ORDER — SODIUM CHLORIDE 0.9% FLUSH: 3.0000 mL | INTRAVENOUS | Status: DC | PRN

## 2017-09-17 MED ORDER — ATORVASTATIN CALCIUM 40 MG PO TABS
40.0000 mg | ORAL_TABLET | Freq: Every day | ORAL | Status: DC
  Administered 2017-09-17 – 2017-09-20 (×4): 40 mg via ORAL
  Filled 2017-09-17 (×4): qty 1

## 2017-09-17 MED ORDER — HEPARIN (PORCINE) IN NACL 2-0.9 UNIT/ML-% IJ SOLN
INTRAMUSCULAR | Status: AC
  Filled 2017-09-17: qty 1000

## 2017-09-17 MED ORDER — ASPIRIN 81 MG PO CHEW
81.0000 mg | CHEWABLE_TABLET | Freq: Every day | ORAL | Status: DC
  Administered 2017-09-18 – 2017-09-20 (×3): 81 mg via ORAL
  Filled 2017-09-17 (×3): qty 1

## 2017-09-17 MED ORDER — VERAPAMIL HCL 2.5 MG/ML IV SOLN
INTRAVENOUS | Status: AC
Start: 2017-09-17 — End: 2017-09-17
  Filled 2017-09-17: qty 2

## 2017-09-17 MED ORDER — SODIUM CHLORIDE 0.9 % IV SOLN
INTRAVENOUS | Status: DC
  Administered 2017-09-17: 11:00:00 via INTRAVENOUS

## 2017-09-17 MED ORDER — SODIUM CHLORIDE 0.9 % IV SOLN: 250.0000 mL | INTRAVENOUS | Status: DC | PRN

## 2017-09-17 MED ORDER — IOPAMIDOL (ISOVUE-370) INJECTION 76%
INTRAVENOUS | Status: AC
  Filled 2017-09-17: qty 100

## 2017-09-17 MED ORDER — HEPARIN (PORCINE) IN NACL 2-0.9 UNIT/ML-% IJ SOLN
INTRAMUSCULAR | Status: DC | PRN
  Administered 2017-09-17: 1000 mL

## 2017-09-17 MED ORDER — SODIUM CHLORIDE 0.9 % IV SOLN: INTRAVENOUS | Status: AC

## 2017-09-17 MED ORDER — INSULIN ASPART 100 UNIT/ML ~~LOC~~ SOLN
0.0000 [IU] | Freq: Three times a day (TID) | SUBCUTANEOUS | Status: DC
Start: 2017-09-17 — End: 2017-09-18
  Administered 2017-09-17: 3 [IU] via SUBCUTANEOUS
  Administered 2017-09-18: 11 [IU] via SUBCUTANEOUS
  Administered 2017-09-18: 5 [IU] via SUBCUTANEOUS
  Administered 2017-09-18: 8 [IU] via SUBCUTANEOUS

## 2017-09-17 MED ORDER — NITROGLYCERIN 0.4 MG SL SUBL: 0.4000 mg | SUBLINGUAL_TABLET | SUBLINGUAL | Status: DC | PRN

## 2017-09-17 MED ORDER — ANGIOPLASTY BOOK
Freq: Once | Status: AC
  Administered 2017-09-17: 21:00:00
  Filled 2017-09-17: qty 1

## 2017-09-17 MED ORDER — HEPARIN SODIUM (PORCINE) 1000 UNIT/ML IJ SOLN
INTRAMUSCULAR | Status: AC
  Filled 2017-09-17: qty 1

## 2017-09-17 MED ORDER — ADENOSINE (DIAGNOSTIC) 140MCG/KG/MIN
INTRAVENOUS | Status: DC | PRN
  Administered 2017-09-17: 140 ug/kg/min via INTRAVENOUS

## 2017-09-17 MED ORDER — MIDAZOLAM HCL 2 MG/2ML IJ SOLN
INTRAMUSCULAR | Status: DC | PRN
  Administered 2017-09-17: 1 mg via INTRAVENOUS
  Administered 2017-09-17: 0.5 mg via INTRAVENOUS

## 2017-09-17 MED ORDER — ONDANSETRON HCL 4 MG/2ML IJ SOLN: 4.0000 mg | Freq: Four times a day (QID) | INTRAMUSCULAR | Status: DC | PRN

## 2017-09-17 MED ORDER — HYDRALAZINE HCL 20 MG/ML IJ SOLN: 5.0000 mg | INTRAMUSCULAR | Status: AC | PRN

## 2017-09-17 MED ORDER — MIDAZOLAM HCL 2 MG/2ML IJ SOLN
INTRAMUSCULAR | Status: AC
  Filled 2017-09-17: qty 2

## 2017-09-17 MED ORDER — ISOSORBIDE MONONITRATE ER 30 MG PO TB24
30.0000 mg | ORAL_TABLET | Freq: Every day | ORAL | Status: DC
  Administered 2017-09-18: 30 mg via ORAL
  Filled 2017-09-17: qty 1

## 2017-09-17 MED ORDER — FENTANYL CITRATE (PF) 100 MCG/2ML IJ SOLN
INTRAMUSCULAR | Status: DC | PRN
  Administered 2017-09-17 (×2): 25 ug via INTRAVENOUS

## 2017-09-17 MED ORDER — LIDOCAINE HCL 2 % IJ SOLN
INTRAMUSCULAR | Status: DC | PRN
  Administered 2017-09-17: 5 mL

## 2017-09-17 MED ORDER — HEPARIN SODIUM (PORCINE) 5000 UNIT/ML IJ SOLN
5000.0000 [IU] | Freq: Three times a day (TID) | INTRAMUSCULAR | Status: DC
  Administered 2017-09-18 – 2017-09-22 (×9): 5000 [IU] via SUBCUTANEOUS
  Filled 2017-09-17 (×8): qty 1

## 2017-09-17 MED ORDER — HYDROCODONE-ACETAMINOPHEN 5-325 MG PO TABS
1.0000 | ORAL_TABLET | Freq: Four times a day (QID) | ORAL | Status: DC | PRN
  Administered 2017-09-17 – 2017-09-22 (×10): 1 via ORAL
  Filled 2017-09-17 (×10): qty 1

## 2017-09-17 MED ORDER — AMLODIPINE BESYLATE 5 MG PO TABS
5.0000 mg | ORAL_TABLET | Freq: Every day | ORAL | Status: DC
  Administered 2017-09-18 – 2017-09-22 (×5): 5 mg via ORAL
  Filled 2017-09-17 (×5): qty 1

## 2017-09-17 MED ORDER — LIDOCAINE HCL 2 % IJ SOLN
INTRAMUSCULAR | Status: AC
  Filled 2017-09-17: qty 10

## 2017-09-17 MED ORDER — SODIUM CHLORIDE 0.9% FLUSH
3.0000 mL | Freq: Two times a day (BID) | INTRAVENOUS | Status: DC
  Administered 2017-09-17 – 2017-09-20 (×7): 3 mL via INTRAVENOUS

## 2017-09-17 MED ORDER — FENTANYL CITRATE (PF) 100 MCG/2ML IJ SOLN
INTRAMUSCULAR | Status: AC
  Filled 2017-09-17: qty 2

## 2017-09-17 SURGICAL SUPPLY — 16 items
CATH BALLN WEDGE 5F 110CM (CATHETERS) ×1 IMPLANT
CATH IMPULSE 5F ANG/FL3.5 (CATHETERS) ×1 IMPLANT
CATH LAUNCHER 5F EBU3.0 (CATHETERS) IMPLANT
CATHETER LAUNCHER 5F EBU3.0 (CATHETERS) ×2
DEVICE RAD COMP TR BAND LRG (VASCULAR PRODUCTS) ×1 IMPLANT
GLIDESHEATH SLEND SS 6F .021 (SHEATH) ×1 IMPLANT
GUIDEWIRE INQWIRE 1.5J.035X260 (WIRE) IMPLANT
GUIDEWIRE PRESSURE COMET II (WIRE) ×1 IMPLANT
INQWIRE 1.5J .035X260CM (WIRE) ×2
KIT ESSENTIALS PG (KITS) ×1 IMPLANT
KIT HEART LEFT (KITS) ×2 IMPLANT
PACK CARDIAC CATHETERIZATION (CUSTOM PROCEDURE TRAY) ×2 IMPLANT
SHEATH GLIDE SLENDER 4/5FR (SHEATH) ×1 IMPLANT
TRANSDUCER W/STOPCOCK (MISCELLANEOUS) ×2 IMPLANT
TUBING CIL FLEX 10 FLL-RA (TUBING) ×2 IMPLANT
WIRE HI TORQ BMW 190CM (WIRE) ×1 IMPLANT

## 2017-09-17 NOTE — Progress Notes (Signed)
TR BAND REMOVAL  LOCATION:    right radial  DEFLATED PER PROTOCOL:    Yes.    TIME BAND OFF / DRESSING APPLIED:    1845   SITE UPON ARRIVAL:    Level 0  SITE AFTER BAND REMOVAL:    Level 0  CIRCULATION SENSATION AND MOVEMENT:    Within Normal Limits   Yes.    COMMENTS:   Dressing applied C/D/I, no bleeding, no hematoma. Soft to touch. Post removal instructions provided, teach back effective.

## 2017-09-17 NOTE — H&P (Addendum)
Cardiac Catheterization History & Physical    Patient ID: [name] MRN: [id number], DOB: Apr 28, 1941 Date of Encounter: 09/17/2017, 2:45 PM Primary Physician: Marletta Lor, MD Primary Cardiologist: Dr. Acie Fredrickson  Chief Complaint: shortness of breath Reason for Catheterization: Acute on chronic systolic heart failure and recent NSTEMI Requesting MD: Daune Perch, NP  HPI: 76 year old woman with history of nonobstructive CAD by remote, hypertension, hyperlipidemia, AML in remission, CKD, chronic anemia, and moderate COPD, presenting for evaluation of newly diagnosed low LVEF and recent NSTEMI. She was hospitalized last month due to worsening dyspnea on exertion and burning in her chest. At the time, echo revealed LVEF of 40-45% with apical hypokinesis. Troponin also rose up to 0.47. In the setting of acute on chronic kidney disease and 1/2 positive blood cultures, the decision was made to manage her medically rather than pursue cardiac catheterization at that time. She was seen in clinic for follow-up yesterday and noted continued significant exertional dyspnea (NYHA III). Creatinine has returned to baseline. She has therefore been referred for left and right heart catheterization.  Past Medical History:  Diagnosis Date  . acute myeloblastic leukemia (AML) dx'd 06/2014  . Acute pancreatitis   . Atrial flutter ([place])   . Blood transfusion without reported diagnosis 2015   Leukemia  . Cardiomyopathy ([place])   . Chronic kidney disease, stage II (mild)   . Colon polyps 04/29/2010   TUBULAR ADENOMA AND A SERRATED ADENOMA  . COPD (chronic obstructive pulmonary disease) ([place])   . CORONARY ARTERY DISEASE 12/24/2007  . DIABETES MELLITUS, TYPE II 07/13/2007  . HYPERLIPIDEMIA 12/24/2007  . HYPERTENSION 07/13/2007  . NEPHROLITHIASIS, HX OF 12/24/2007  . Unspecified disease of pancreas      Surgical History:  Past Surgical History:  Procedure Laterality Date  . ABDOMINAL HYSTERECTOMY  unknown   . CARDIAC CATHETERIZATION  2002  . CATARACT EXTRACTION Bilateral   . COLONOSCOPY  2011  . EXCISIONAL HEMORRHOIDECTOMY  unknown  . NASAL SINUS SURGERY  unknown  . POLYPECTOMY  2011     Home Meds: Prior to Admission medications   Medication Sig Start Date Jesus Poplin Date Taking? Authorizing Provider  amLODipine (NORVASC) 5 MG tablet Take 1 tablet (5 mg total) by mouth daily. 02/10/17  Yes Marletta Lor, MD  aspirin 81 MG chewable tablet Chew 1 tablet (81 mg total) by mouth daily. 08/29/17  Yes Barton Dubois, MD  budesonide-formoterol [facility] Dt) 160-4.5 MCG/ACT inhaler Inhale 2 puffs into the lungs 2 (two) times daily. Patient taking differently: Inhale 2 puffs into the lungs 2 (two) times daily as needed.  05/08/17 09/16/17 Yes Parrett, Tammy S, NP  furosemide (LASIX) 40 MG tablet Take 1 tablet (40 mg total) by mouth daily. 02/10/17  Yes Marletta Lor, MD  HYDROcodone-acetaminophen (NORCO/VICODIN) 5-325 MG tablet Take 1 tablet by mouth every 6 (six) hours as needed for moderate pain. 09/08/17  Yes Marletta Lor, MD  Insulin Glargine (TOUJEO SOLOSTAR) 300 UNIT/ML SOPN Inject 24 Units into the skin at bedtime. Patient taking differently: Inject 20 Units into the skin at bedtime.  09/08/17  Yes Marletta Lor, MD  insulin lispro (HUMALOG) 100 UNIT/ML KiwkPen Blood sugar less than 70- no insulin Blood sugar 71-150- 6 units Blood sugar 151- 200- 8 units Blood sugar over 200-  10 units Patient taking differently: Inject into the skin 3 (three) times daily. Blood sugar less than 70- no insulin Blood sugar 71-150- 6 units Blood sugar 151- 200- 8 units Blood sugar over  200-  10 units 09/08/17  Yes Marletta Lor, MD  isosorbide mononitrate (IMDUR) 30 MG 24 hr tablet Take 1 tablet (30 mg total) by mouth daily. 08/29/17  Yes Barton Dubois, MD  LORazepam (ATIVAN) 0.5 MG tablet Take 1 tablet (0.5 mg total) by mouth every 6 (six) hours as needed for anxiety. 02/15/16  Yes Marletta Lor, MD  magnesium oxide (MAG-OX) 400 (241.3 Mg) MG tablet Take 2 tablets (800 mg total) by mouth 2 (two) times daily. 02/15/16  Yes Marletta Lor, MD  metoprolol tartrate (LOPRESSOR) 50 MG tablet TAKE 1 TABLET (50 MG TOTAL) BY MOUTH 2 (TWO) TIMES DAILY. 06/08/17  Yes Marletta Lor, MD  nitroGLYCERIN (NITROSTAT) 0.4 MG SL tablet Place 1 tablet (0.4 mg total) under the tongue every 5 (five) minutes as needed for chest pain. 04/25/15  Yes Marletta Lor, MD  promethazine (PHENERGAN) 12.5 MG tablet Take 2 tablets (25 mg total) by mouth every 6 (six) hours as needed for nausea. 05/10/13  Yes [name], PA-C  simvastatin (ZOCOR) 20 MG tablet Take 1 tablet (20 mg total) by mouth every evening. 02/10/17  Yes Marletta Lor, MD    Allergies: No Known Allergies  Social History   Social History  . Marital status: Married    Spouse name: N/A  . Number of children: N/A  . Years of education: N/A   Occupational History  . works in a lab     makes glasses   Social History Main Topics  . Smoking status: Former Smoker    Packs/day: 1.00    Years: 20.00    Types: Cigarettes    Quit date: 12/08/2000  . Smokeless tobacco: Never Used  . Alcohol use No  . Drug use: No  . Sexual activity: Not on file   Other Topics Concern  . 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 Cancer Father        throat ca  . COPD Father   . Cancer Sister        uterine ca and breast ca  . Cancer Brother        throat  . Congestive Heart Failure Mother   . Heart attack Maternal Grandmother   . Colon cancer Neg Hx     Review of Systems: A 12-system review of systems was performed and is negative except as noted in the HPI.  Labs:   Lab Results  Component Value Date   WBC 5.8 09/16/2017   HGB 10.0 (L) 09/16/2017   HCT 30.0 (L) 09/16/2017   MCV 92 09/16/2017   PLT 154  09/16/2017     Recent Labs Lab 09/16/17 1507  NA 139  K 4.0  CL 103  CO2 29  BUN 15  CREATININE 1.46*  CALCIUM 9.1  GLUCOSE 292*   No results for input(s): CKTOTAL, CKMB, TROPONINI in the last 72 hours. Lab Results  Component Value Date   CHOL 170 10/05/2015   HDL 37 (L) 10/05/2015   LDLCALC NOT CALC 10/05/2015   TRIG 401 (H) 10/05/2015   Lab Results  Component Value Date   DDIMER 12.97 (H) 02/04/2014    Radiology/Studies:  Dg Chest 2 View  Result Date: 08/24/2017 CLINICAL DATA:  Shortness of breath EXAM: CHEST  2 VIEW COMPARISON:  03/27/2017 FINDINGS: Mild cardiac enlargement. No pulmonary vascular congestion or  edema. Mild blunting of costophrenic angles suggesting small effusions. No pneumothorax. Mediastinal contours appear intact. Calcification of the aorta. Power port type central venous catheter on the right with tip over the low SVC region. Degenerative changes in the shoulders. IMPRESSION: Cardiac enlargement. Blunting of the costophrenic angles suggesting small effusions. No edema or consolidation. Electronically Signed   By: Lucienne Capers M.D.   On: 08/24/2017 01:40   Wt Readings from Last 3 Encounters:  09/17/17 103.4 kg (228 lb)  09/16/17 103.4 kg (228 lb)  09/08/17 101.6 kg (224 lb)    EKG: 09/16/17 - normal sinus rhythm with first-degree AV block, right axis deviation, low voltage, prolonged QT, and inferior and anterolateral T-wave inversions.  Physical Exam: Blood pressure (!) 145/43, pulse 69, temperature 98.5 F (36.9 C), temperature source Oral, height 5\' 2"  (1.575 m), weight 103.4 kg (228 lb), SpO2 95 %. Body mass index is 41.7 kg/m. General: Obese woman seated comfortably in a chair. She is accompanied by her daughter. Head: Normocephalic, atraumatic, sclera non-icteric, no xanthomas, nares are without discharge.  Neck: difficult to assess JVP due to body habitus. Lungs: diminished breath sounds throughout without wheezes or crackles. Heart:  distant heart sounds. Regular rate and rhythm without murmurs. Abdomen: Soft, non-tender, non-distended with normoactive bowel sounds. No hepatomegaly. No rebound/guarding. No obvious abdominal masses. Msk:  Strength and tone appear normal for age. Extremities: 1+ pretibial edema bilaterally. Neuro: Alert and oriented X 3. No focal deficit. No facial asymmetry. Moves all extremities spontaneously. Psych:  Responds to questions appropriately with a normal affect.    Assessment and Plan  Ms. Lepak is a 76 year old woman with history of nonobstructive CAD and recently diagnosed moderately reduced LVEF with apical wall motion abnormality and mild troponin elevation during hospitalization last month, who presents for left and right heart catheterization for evaluation of persistent dyspnea on exertion and cardiomyopathy. I have reviewed the risks, indications, and alternatives to cardiac catheterization, possible angioplasty, and stenting with the patient. Risks include but are not limited to bleeding, infection, vascular injury, stroke, myocardial infection, arrhythmia, kidney injury, radiation-related injury in the case of prolonged fluoroscopy use, emergency cardiac surgery, and death. The patient understands the risks of serious complication is 1-2 in 1655 with diagnostic cardiac cath and 1-2% or less with angioplasty/stenting.  Verlan Friends Adreena Willits MD 09/17/2017, 2:45 PM Pager: [phone]

## 2017-09-18 ENCOUNTER — Encounter (HOSPITAL_COMMUNITY): Payer: Self-pay | Admitting: Internal Medicine

## 2017-09-18 ENCOUNTER — Inpatient Hospital Stay (HOSPITAL_COMMUNITY): Payer: Medicare HMO

## 2017-09-18 DIAGNOSIS — I25110 Atherosclerotic heart disease of native coronary artery with unstable angina pectoris: Secondary | ICD-10-CM

## 2017-09-18 DIAGNOSIS — N186 End stage renal disease: Secondary | ICD-10-CM

## 2017-09-18 DIAGNOSIS — Z992 Dependence on renal dialysis: Secondary | ICD-10-CM

## 2017-09-18 LAB — CBC
HCT: 31 % — ABNORMAL LOW (ref 36.0–46.0)
HEMOGLOBIN: 9.7 g/dL — AB (ref 12.0–15.0)
MCH: 29.7 pg (ref 26.0–34.0)
MCHC: 31.3 g/dL (ref 30.0–36.0)
MCV: 94.8 fL (ref 78.0–100.0)
Platelets: 144 10*3/uL — ABNORMAL LOW (ref 150–400)
RBC: 3.27 MIL/uL — ABNORMAL LOW (ref 3.87–5.11)
RDW: 14.2 % (ref 11.5–15.5)
WBC: 5.2 10*3/uL (ref 4.0–10.5)

## 2017-09-18 LAB — BASIC METABOLIC PANEL
ANION GAP: 10 (ref 5–15)
BUN: 17 mg/dL (ref 6–20)
CO2: 24 mmol/L (ref 22–32)
Calcium: 8.4 mg/dL — ABNORMAL LOW (ref 8.9–10.3)
Chloride: 103 mmol/L (ref 101–111)
Creatinine, Ser: 1.5 mg/dL — ABNORMAL HIGH (ref 0.44–1.00)
GFR calc Af Amer: 38 mL/min — ABNORMAL LOW (ref >60)
GFR calc non Af Amer: 33 mL/min — ABNORMAL LOW (ref >60)
GLUCOSE: 338 mg/dL — AB (ref 65–99)
POTASSIUM: 3.8 mmol/L (ref 3.5–5.1)
Sodium: 137 mmol/L (ref 135–145)

## 2017-09-18 LAB — GLUCOSE, CAPILLARY
GLUCOSE-CAPILLARY: 320 mg/dL — AB (ref 65–99)
GLUCOSE-CAPILLARY: 264 mg/dL — AB (ref 65–99)
GLUCOSE-CAPILLARY: 313 mg/dL — AB (ref 65–99)
Glucose-Capillary: 239 mg/dL — ABNORMAL HIGH (ref 65–99)

## 2017-09-18 LAB — PULMONARY FUNCTION TEST
FEF 25-75 Post: 0.29 L/sec
FEF 25-75 Pre: 0.29 L/sec
FEF2575-%Change-Post: 0 %
FEF2575-%Pred-Post: 18 %
FEF2575-%Pred-Pre: 19 %
FEV1-%Change-Post: 8 %
FEV1-%Pred-Post: 48 %
FEV1-%Pred-Pre: 44 %
FEV1-Post: 0.92 L
FEV1-Pre: 0.85 L
FEV1FVC-%Change-Post: 30 %
FEV1FVC-%Pred-Pre: 67 %
FEV6-%Change-Post: -14 %
FEV6-%Pred-Post: 56 %
FEV6-%Pred-Pre: 66 %
FEV6-Post: 1.37 L
FEV6-Pre: 1.6 L
FEV6FVC-%Change-Post: 3 %
FEV6FVC-%Pred-Post: 105 %
FEV6FVC-%Pred-Pre: 101 %
FVC-%Change-Post: -17 %
FVC-%Pred-Post: 54 %
FVC-%Pred-Pre: 65 %
FVC-Post: 1.37 L
FVC-Pre: 1.66 L
Post FEV1/FVC ratio: 67 %
Post FEV6/FVC ratio: 100 %
Pre FEV1/FVC ratio: 51 %
Pre FEV6/FVC Ratio: 97 %

## 2017-09-18 LAB — HEPATIC FUNCTION PANEL
ALK PHOS: 83 U/L (ref 38–126)
ALT: 14 U/L (ref 14–54)
AST: 20 U/L (ref 15–41)
Albumin: 3.2 g/dL — ABNORMAL LOW (ref 3.5–5.0)
BILIRUBIN INDIRECT: 0.6 mg/dL (ref 0.3–0.9)
BILIRUBIN TOTAL: 0.8 mg/dL (ref 0.3–1.2)
Bilirubin, Direct: 0.2 mg/dL (ref 0.1–0.5)
Total Protein: 5.7 g/dL — ABNORMAL LOW (ref 6.5–8.1)

## 2017-09-18 MED ORDER — POTASSIUM CHLORIDE CRYS ER 10 MEQ PO TBCR
10.0000 meq | EXTENDED_RELEASE_TABLET | Freq: Once | ORAL | Status: AC
  Administered 2017-09-18: 10 meq via ORAL
  Filled 2017-09-18: qty 1

## 2017-09-18 MED ORDER — FUROSEMIDE 10 MG/ML IJ SOLN
40.0000 mg | Freq: Once | INTRAMUSCULAR | Status: AC
  Administered 2017-09-18: 40 mg via INTRAVENOUS
  Filled 2017-09-18: qty 4

## 2017-09-18 MED ORDER — ALBUTEROL SULFATE (2.5 MG/3ML) 0.083% IN NEBU
INHALATION_SOLUTION | RESPIRATORY_TRACT | Status: AC
  Administered 2017-09-18: 2.5 mg
  Filled 2017-09-18: qty 3

## 2017-09-18 MED ORDER — ALBUTEROL SULFATE (2.5 MG/3ML) 0.083% IN NEBU: 2.5000 mg | INHALATION_SOLUTION | Freq: Once | RESPIRATORY_TRACT | Status: DC

## 2017-09-18 MED ORDER — INSULIN ASPART 100 UNIT/ML ~~LOC~~ SOLN
0.0000 [IU] | Freq: Three times a day (TID) | SUBCUTANEOUS | Status: DC
  Administered 2017-09-18: 11 [IU] via SUBCUTANEOUS
  Administered 2017-09-19: 3 [IU] via SUBCUTANEOUS
  Administered 2017-09-19 (×3): 8 [IU] via SUBCUTANEOUS
  Administered 2017-09-20: 3 [IU] via SUBCUTANEOUS
  Administered 2017-09-20: 11 [IU] via SUBCUTANEOUS
  Administered 2017-09-20: 8 [IU] via SUBCUTANEOUS
  Administered 2017-09-20: 15 [IU] via SUBCUTANEOUS
  Administered 2017-09-21: 11 [IU] via SUBCUTANEOUS
  Administered 2017-09-21: 18:00:00 15 [IU] via SUBCUTANEOUS
  Administered 2017-09-22: 13:00:00 5 [IU] via SUBCUTANEOUS

## 2017-09-18 NOTE — Consult Note (Signed)
   [facility] Inpatient Consult   09/18/2017  [name] 04/24/1941 [id number]  Patient assessed for re-admission in the [facility] Medicare ACO with [facility] and active with [facility] RN.  Patient was being followed by [place] for HF EMMI calls follow up. Patient is currently being evaluated for CABG for 1st degree AV block, PVCs and PACs according to documentation. Will follow for progress and disposition needs as appropriate. Please contact for questions or referrals:  [name], RN BSN [facility] Liaison  [phone] business mobile phone Toll free office [phone]

## 2017-09-18 NOTE — Progress Notes (Signed)
Results for NELSON, JULSON (MRN [id number]) as of 09/18/2017 12:35  Ref. Range 09/17/2017 10:54 09/17/2017 16:51 09/17/2017 21:07 09/18/2017 06:33 09/18/2017 11:31  Glucose-Capillary Latest Ref Range: 65 - 99 mg/dL 323 (H) 174 (H) 257 (H) 320 (H) 239 (H)  Received diabetes coordinator consult.  Noted that patient was on Toujeo 24 units every HS and Humalog sliding scale 6 units (CBG 71-150 mg/dl)-8 units (CBG 151-200 mg/dl)-10 units (CBG >200 mg/dl) at home. Was a patient in the hospital in September, 2018.   Recommend increasing Lantus to 24 units every HS (if blood sugars continue to be elevated) and continue Novolog MODERATE correction scale TID & HS.  If blood sugars continue to be greater than 180 mg/dl, may need to add Novolog 3-4 units TID with meals if patient eats at least 50 % of meal.  Will continue to follow patient's blood sugars while in the hospital.   Harvel Ricks RN BSN CDE Diabetes Coordinator Pager: [phone]  8am-5pm

## 2017-09-18 NOTE — Consult Note (Signed)
[address]       [place] [address]             [place] Record #[id number] Date of Birth: [birth year]  Referring: Dr. Saunders Revel Primary Care: Marletta Lor, MD  Chief Complaint:   No chief complaint on file.   History of Present Illness:     [name] is a 76 year old female with a past medical history of known nonobstructive coronary artery disease, Was on dialysis while being treated for AML, stayed in hospital for 2 months ,   TIA, history of atrial fibrillation with RVR, history of tobacco abuse, uncontrolled type 2 diabetes mellitus, hypertension, hyperlipidemia, chronic back pain, AML in remission since 2015 with Port-A-Cath in place, chronic kidney disease, chronic anemia, and COPD who presented to [facility] emergency department on 08/24/2017 with progressive worsening dyspnea on exertion. She also had burning chest discomfort which was relieved when sitting up and with nitroglycerin. She has had orthopnea and lower extremity edema. On her initial workup she did have 1/2 positive blood cultures for Streptococcus mitis/oralis. Diuretics were initiated for fluid overload and her creatinine increased to 2.05. The patient had been on dialysis once before when she had chemotherapy and did not want to risk needing dialysis again. It was decided at this time to manage the patient medically and hold off on doing a cardiac catheterization.   She then returned to her cardiologist's office on 10/10 for a follow-up visit. She was having increased shortness of breath. She continued to have dyspnea on exertion. She continued to have occasional chest tightness across her upper shoulders. At this time she was agreeable to cardiac catheterization. On cardiac cath she was found to have significant multivessel coronary artery disease, including sequential 40% ostial and 50% distal L MCA stenosis, sequential proximal and mid LAD stenosis of  50-60%, 40% distal left circumflex stenosis, and 80% proximal to mid RCA stenosis.  She did receive an echocardiogram on 08/24/2017 which showed a LVEF of 40-45%, and no significant valvular disease. With multivessel coronary artery disease, diabetes mellitus, and a reduced LVEF of the patient was admitted for cardiac surgery consultation for coronary bypass grafting.   Current Activity/ Functional Status: Patient was independent with mobility/ambulation, transfers, ADL's, IADL's.   Zubrod Score: At the time of surgery this patient's most appropriate activity status/level should be described as: []     0    Normal activity, no symptoms []     1    Restricted in physical strenuous activity but ambulatory, able to do out light work []     2    Ambulatory and capable of self care, unable to do work activities, up and about                 more than 50%  Of the time                            [x]     3    Only limited self care, in bed greater than 50% of waking hours []     4    Completely disabled, no self care, confined to bed or chair []     5    Moribund  Past Medical History:  Diagnosis Date  . acute myeloblastic leukemia (AML) dx'd 06/2014  . Acute pancreatitis   . Arthritis    "  back" (09/17/2017)  . Atrial flutter ([place])   . Cardiomyopathy ([place])   . Chronic kidney disease, stage II (mild)   . Chronic lower back pain   . Colon polyps 04/29/2010   TUBULAR ADENOMA AND A SERRATED ADENOMA  . COPD (chronic obstructive pulmonary disease) ([place])   . CORONARY ARTERY DISEASE 12/24/2007  . DIABETES MELLITUS, TYPE II 07/13/2007  . History of blood transfusion 2015   "related to leukemia"  . History of kidney stones 12/24/2007  . HYPERLIPIDEMIA 12/24/2007  . HYPERTENSION 07/13/2007  . NSTEMI (non-ST elevated myocardial infarction) ([place]) 09/17/2017  . Unspecified disease of pancreas   . Uterine cancer [facility])     Past Surgical History:  Procedure Laterality Date  . ABDOMINAL HYSTERECTOMY     "still  have my ovaries"  . CARDIAC CATHETERIZATION  2002  . CARDIAC CATHETERIZATION  09/17/2017  . CATARACT EXTRACTION W/ INTRAOCULAR LENS  IMPLANT, BILATERAL Bilateral   . COLONOSCOPY W/ BIOPSIES AND POLYPECTOMY  2011  . DILATION AND CURETTAGE OF UTERUS    . EXCISIONAL HEMORRHOIDECTOMY  X 2  . GANGLION CYST EXCISION Left X 3  . INTRAVASCULAR PRESSURE WIRE/FFR STUDY N/A 09/17/2017   Procedure: INTRAVASCULAR PRESSURE WIRE/FFR STUDY;  Surgeon: Nelva Bush, MD;  Location: [place] [facility];  Service: Cardiovascular;  Laterality: N/A;  . NASAL SINUS SURGERY    . PORTA CATH INSERTION Right 2013  . RIGHT/LEFT HEART CATH AND CORONARY ANGIOGRAPHY N/A 09/17/2017   Procedure: RIGHT/LEFT HEART CATH AND CORONARY ANGIOGRAPHY;  Surgeon: Nelva Bush, MD;  Location: [facility] [facility];  Service: Cardiovascular;  Laterality: N/A;  . TUBAL LIGATION      History  Smoking Status  . Former Smoker  . Packs/day: 1.00  . Years: 20.00  . Types: Cigarettes  . Quit date: 12/08/2000  Smokeless Tobacco  . Never Used    History  Alcohol Use No    Social History   Social History  . Marital status: Married    Spouse name: N/A  . Number of children: N/A  . Years of education: N/A   Occupational History  . works in a lab     makes glasses   Social History Main Topics  . Smoking status: Former Smoker    Packs/day: 1.00    Years: 20.00    Types: Cigarettes    Quit date: 12/08/2000  . Smokeless tobacco: Never Used  . Alcohol use No  . Drug use: No  . Sexual activity: No   Other Topics Concern  . 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 0.9 %  sodium chloride infusion  250 mL Intravenous PRN End, Harrell Gave, MD      . acetaminophen (TYLENOL) tablet 650 mg  650 mg Oral Q4H PRN End, Harrell Gave, MD      .  amLODipine (NORVASC) tablet 5 mg  5 mg Oral Daily End, Christopher, MD   5 mg at 09/18/17 0930  . aspirin chewable tablet 81 mg  81 mg Oral Daily End, Christopher, MD   81 mg at 09/18/17 0930  . atorvastatin (LIPITOR) tablet 40 mg  40 mg Oral q1800 End, Christopher, MD   40 mg at 09/17/17 1701  . heparin injection 5,000 Units  5,000 Units Subcutaneous Q8H End, Christopher, MD   5,000 Units at 09/18/17 0451  .  HYDROcodone-acetaminophen (NORCO/VICODIN) 5-325 MG per tablet 1 tablet  1 tablet Oral Q6H PRN End, Harrell Gave, MD   1 tablet at 09/18/17 0059  . insulin aspart (novoLOG) injection 0-15 Units  0-15 Units Subcutaneous TID WC End, Harrell Gave, MD   5 Units at 09/18/17 1209  . insulin glargine (LANTUS) injection 20 Units  20 Units Subcutaneous QHS End, Christopher, MD   20 Units at 09/17/17 2107  . isosorbide mononitrate (IMDUR) 24 hr tablet 30 mg  30 mg Oral Daily End, Christopher, MD   30 mg at 09/18/17 0930  . LORazepam (ATIVAN) tablet 0.5 mg  0.5 mg Oral Q6H PRN End, Harrell Gave, MD   0.5 mg at 09/18/17 0059  . metoprolol tartrate (LOPRESSOR) tablet 50 mg  50 mg Oral BID End, Christopher, MD   50 mg at 09/18/17 0930  . nitroGLYCERIN (NITROSTAT) SL tablet 0.4 mg  0.4 mg Sublingual Q5 min PRN End, Harrell Gave, MD      . ondansetron (ZOFRAN) injection 4 mg  4 mg Intravenous Q6H PRN End, Christopher, MD      . sodium chloride flush (NS) 0.9 % injection 3 mL  3 mL Intravenous Q12H End, Christopher, MD   3 mL at 09/18/17 0931  . sodium chloride flush (NS) 0.9 % injection 3 mL  3 mL Intravenous PRN End, Harrell Gave, MD       Facility-Administered Medications Ordered in Other Encounters  Medication Dose Route Frequency Provider Last Rate Last Dose  . sodium chloride 0.9 % 1,000 mL with magnesium sulfate 2 g infusion   Intravenous Continuous Ladell Pier, MD   Stopped at 08/04/14 1441    Prescriptions Prior to Admission  Medication Sig Dispense Refill Last Dose  . amLODipine (NORVASC) 5 MG  tablet Take 1 tablet (5 mg total) by mouth daily. 90 tablet 1 09/17/2017 at 0600  . aspirin 81 MG chewable tablet Chew 1 tablet (81 mg total) by mouth daily. 30 tablet 1 09/17/2017 at 0600  . budesonide-formoterol (SYMBICORT) 160-4.5 MCG/ACT inhaler Inhale 2 puffs into the lungs 2 (two) times daily. (Patient taking differently: Inhale 2 puffs into the lungs 2 (two) times daily as needed. ) 1 Inhaler 5 Taking  . furosemide (LASIX) 40 MG tablet Take 1 tablet (40 mg total) by mouth daily. 90 tablet 1 09/16/2017 at Unknown time  . HYDROcodone-acetaminophen (NORCO/VICODIN) 5-325 MG tablet Take 1 tablet by mouth every 6 (six) hours as needed for moderate pain. 120 tablet 0 09/16/2017 at Unknown time  . Insulin Glargine (TOUJEO SOLOSTAR) 300 UNIT/ML SOPN Inject 24 Units into the skin at bedtime. (Patient taking differently: Inject 20 Units into the skin at bedtime. ) 1 pen 6 09/16/2017 at Unknown time  . insulin lispro (HUMALOG) 100 UNIT/ML KiwkPen Blood sugar less than 70- no insulin Blood sugar 71-150- 6 units Blood sugar 151- 200- 8 units Blood sugar over 200-  10 units (Patient taking differently: Inject into the skin 3 (three) times daily. Blood sugar less than 70- no insulin Blood sugar 71-150- 6 units Blood sugar 151- 200- 8 units Blood sugar over 200-  10 units) 15 mL 11 09/16/2017 at Unknown time  . isosorbide mononitrate (IMDUR) 30 MG 24 hr tablet Take 1 tablet (30 mg total) by mouth daily. 30 tablet 1 09/16/2017 at Unknown time  . LORazepam (ATIVAN) 0.5 MG tablet Take 1 tablet (0.5 mg total) by mouth every 6 (six) hours as needed for anxiety. 90 tablet 5 09/16/2017 at Unknown time  . magnesium oxide (MAG-OX) 400 (  241.3 Mg) MG tablet Take 2 tablets (800 mg total) by mouth 2 (two) times daily. 360 tablet 3 09/16/2017 at Unknown time  . metoprolol tartrate (LOPRESSOR) 50 MG tablet TAKE 1 TABLET (50 MG TOTAL) BY MOUTH 2 (TWO) TIMES DAILY. 180 tablet 1 09/17/2017 at 0600  . nitroGLYCERIN (NITROSTAT)  0.4 MG SL tablet Place 1 tablet (0.4 mg total) under the tongue every 5 (five) minutes as needed for chest pain. 90 tablet 1 09/16/2017 at Unknown time  . promethazine (PHENERGAN) 12.5 MG tablet Take 2 tablets (25 mg total) by mouth every 6 (six) hours as needed for nausea. 12 tablet 0 Taking  . simvastatin (ZOCOR) 20 MG tablet Take 1 tablet (20 mg total) by mouth every evening. 90 tablet 2 09/16/2017 at Unknown time    Family History  Problem Relation Age of Onset  . Cancer Father        throat ca  . COPD Father   . Cancer Sister        uterine ca and breast ca  . Cancer Brother        throat  . Congestive Heart Failure Mother   . Heart attack Maternal Grandmother   . Colon cancer Neg Hx      Review of Systems:  Pertinent items are noted in HPI.     Cardiac Review of Systems: Y or N  Chest Pain [  Y  ]  Resting SOB [   ] Exertional SOB  [ Y ]  Orthopnea [  ]   Pedal Edema [ Y  ]    Palpitations [  ] Syncope  [ N ]   Presyncope [   ]  General Review of Systems: [Y] = yes [  ]=no Constitional: recent weight change [  ]; anorexia [  ]; fatigue [  ]; nausea [ N ]; night sweats [  ]; fever [ N ]; or chills [  ]                                                               Dental: poor dentition[  ]; Last Dentist visit:   Eye : blurred vision [  ]; diplopia [   ]; vision changes [  ];  Amaurosis fugax[  ]; Resp: cough Aqua.Slicker  ];  wheezing[ N ];  hemoptysis[  ]; shortness of breath[Y  ]; paroxysmal nocturnal dyspnea[  ]; dyspnea on exertion[Y  ]; or orthopnea[Y  ];  GI:  gallstones[  ], vomiting[N  ];  dysphagia[  ]; melena[  ];  hematochezia [  ]; heartburn[  ];   Hx of  Colonoscopy[  ]; GU: kidney stones [  ]; hematuria[  ];   dysuria [  ];  nocturia[  ];  history of  obstruction [ Y ]; urinary frequency [  ]             Skin: rash, swelling[ N ];, hair loss[  ];  peripheral edema[ Y ];  or itching[  ]; Musculosketetal: myalgias[  ];  joint swelling[  ];  joint erythema[ N ];  joint pain[  Y ];  back pain[ Y ];  Heme/Lymph: bruising[  ];  bleeding[  ];  anemia[  ];  Neuro: TIA[Y  ];  headaches[  ];  stroke[ N ];  vertigo[  ];  seizures[  ];   paresthesias[  ];  difficulty walking[  ];  Psych:depression[N  ]; anxiety[ N ];  Endocrine: diabetes[Y  ];  thyroid dysfunction[ N ];  Immunizations: Flu [  ]; Pneumococcal[  ];  Other:  Physical Exam: BP 140/61 (BP Location: Left Arm)   Pulse 61   Temp 97.8 F (36.6 C) (Oral)   Resp (!) 24   Ht 5\' 2"  (1.575 m)   Wt 228 lb (103.4 kg)   SpO2 93%   BMI 41.70 kg/m    General appearance: alert, cooperative and no distress Resp: clear to auscultation bilaterally Cardio: regular rate and rhythm, S1, S2 normal, no murmur, click, rub or gallop GI: soft, non-tender; bowel sounds normal; no masses,  no organomegaly Extremities: extremities normal, atraumatic, no cyanosis or edema  Diagnostic Studies & Laboratory data:  Cardiac Cath 10/11   Conclusion   Conclusions: 1. Significant multivessel coronary artery disease, including sequential 40% ostial and 50% distal LMCA stenoses, sequential proximal and mid LAD stenoses of 50-60%, 40% distal LCx lesion, and 80% proximal to mid RCA stenosis. FFR of the LAD is hemodynamically significant (FFR 0.72). 2. Mildly elevated left and right heart filling pressures. 3. Normal Fick cardiac output  Recommendations: 1. Admit for medical optimization and cardiac surgery consultation for bypass, given the patient's multivessel CAD, diabetes mellitus, and reduced LVEF. 2. Gentle hydration tonight. Restart diuresis tomorrow based on renal function. 3. Aggressive secondary prevention.    Study Conclusions Echocardiogram 08/24/17  - Left ventricle: The cavity size was normal. There was moderate   concentric hypertrophy. Systolic function was mildly to   moderately reduced. The estimated ejection fraction was in the   range of 40% to 45%. Hypokinesis of all mid-apical segments.   Apical  akinesis. - Aortic valve: Transvalvular velocity was within the normal range.   There was no stenosis. There was no regurgitation. - Mitral valve: Mild thickening and calcification. Transvalvular   velocity was within the normal range. There was no evidence for   stenosis. There was trivial regurgitation. - Right ventricle: The cavity size was normal. Wall thickness was   normal. - Tricuspid valve: There was trivial regurgitation. - Pulmonary arteries: Systolic pressure was moderately increased.   PA peak pressure: 51 mm Hg (S).   Recent Radiology Findings:    CLINICAL DATA:  Shortness of breath  EXAM: CHEST  2 VIEW  COMPARISON:  03/27/2017  FINDINGS: Mild cardiac enlargement. No pulmonary vascular congestion or edema. Mild blunting of costophrenic angles suggesting small effusions. No pneumothorax. Mediastinal contours appear intact. Calcification of the aorta. Power port type central venous catheter on the right with tip over the low SVC region. Degenerative changes in the shoulders.  IMPRESSION: Cardiac enlargement. Blunting of the costophrenic angles suggesting small effusions. No edema or consolidation.   Electronically Signed   By: Lucienne Capers M.D.   On: 08/24/2017 01:40  I have independently reviewed the above radiologic studies.  Recent Lab Findings: Lab Results  Component Value Date   WBC 5.2 09/18/2017   HGB 9.7 (L) 09/18/2017   HCT 31.0 (L) 09/18/2017   PLT 144 (L) 09/18/2017   GLUCOSE 338 (H) 09/18/2017   CHOL 170 10/05/2015   TRIG 401 (H) 10/05/2015   HDL 37 (L) 10/05/2015   LDLCALC NOT CALC 10/05/2015   ALT 14 09/18/2017   AST 20 09/18/2017   NA 137 09/18/2017   K 3.8 09/18/2017   CL 103  09/18/2017   CREATININE 1.50 (H) 09/18/2017   BUN 17 09/18/2017   CO2 24 09/18/2017   TSH 6.197 (H) 08/24/2017   INR 0.9 09/16/2017   HGBA1C 10.3 (H) 07/14/2017   PFT's Pulmonary Function Diagnosis: Severe Obstructive Airways Disease FEV1=   0.85 44%  Chronic Kidney Disease   Stage I     GFR >90  Stage II    GFR 60-89  Stage IIIA GFR 45-59  Stage IIIB GFR 30-44  Stage IV   GFR 15-29  Stage V    GFR  <15  Lab Results  Component Value Date   CREATININE 1.50 (H) 09/18/2017   Estimated Creatinine Clearance: 36.5 mL/min (A) (by C-G formula based on SCr of 1.5 mg/dL (H)).  RISK SCORES About the STS Risk Calculator Procedure: CAB Only  Risk of Mortality: 25.042%  Morbidity or Mortality: 65.496%  Long Length of Stay: 55.433%  Short Length of Stay: 3.156%  Permanent Stroke: 3.78%  Prolonged Ventilation: 51.342%  DSW Infection: 5.474%  Renal Failure: 44.531%  Reoperation: 20.738%    Assessment / Plan:   Patient is very poor candidate for CABG due to high risk of respiratory failure, renal failure ( not on dialysis now but was on with chemo for leukemia ) Consider angioplasty/stent  of RCA and treat remaining non critical disease medically. Discussed with patient and daughter .    Pulmonary Function Diagnosis: Severe Obstructive Airways Disease FEV1 = 44% Patient has very limited functional status, sob  at rest  History of dialysis - not currently History of AML    I  spent 40  minutes counseling the patient face to face and 50% or more the  time was spent in counseling and coordination of care. The total time spent in the appointment was 60 minutes.   Grace Isaac MD      [address] [place] [address] Office [phone]   [place]

## 2017-09-18 NOTE — Progress Notes (Signed)
Progress Note  Patient Name: [name] Date of Encounter: 09/18/2017  Primary Cardiologist: Dr. Acie Fredrickson  Subjective   + SOB just sitting at bedside.   No chest pain or back burning.  + lower ext edema   Inpatient Medications    Scheduled Meds: . amLODipine  5 mg Oral Daily  . aspirin  81 mg Oral Daily  . atorvastatin  40 mg Oral q1800  . heparin  5,000 Units Subcutaneous Q8H  . insulin aspart  0-15 Units Subcutaneous TID WC  . insulin glargine  20 Units Subcutaneous QHS  . isosorbide mononitrate  30 mg Oral Daily  . metoprolol tartrate  50 mg Oral BID  . sodium chloride flush  3 mL Intravenous Q12H   Continuous Infusions: . sodium chloride     PRN Meds: sodium chloride, acetaminophen, HYDROcodone-acetaminophen, LORazepam, nitroGLYCERIN, ondansetron (ZOFRAN) IV, sodium chloride flush   Vital Signs    Vitals:   09/17/17 2200 09/18/17 0500 09/18/17 0635 09/18/17 0803  BP: 129/66 (!) 173/66 (!) 146/48 132/71  Pulse:    72  Resp: 18 (!) 22 15 (!) 22  Temp:  98.7 F (37.1 C)  98.2 F (36.8 C)  TempSrc:  Oral  Oral  SpO2:  93% 92% 94%  Weight:      Height:        Intake/Output Summary (Last 24 hours) at 09/18/17 1058 Last data filed at 09/18/17 0806  Gross per 24 hour  Intake              975 ml  Output              500 ml  Net              475 ml   Filed Weights   09/17/17 1046  Weight: 228 lb (103.4 kg)    Telemetry    SR with 1st degree AV block and PVCs and non conducted PACs - Personally Reviewed  ECG    SR 1st degree AV block with T wave inversion inf ant lat.  These are deeper than in Sept.  - Personally Reviewed  Physical Exam   GEN: No acute distress.  But obviously SOB Neck: No JVD Cardiac: RRR, no murmurs, rubs, or gallops.  Respiratory: breath sounds to auscultation bilaterally and diminished in the bases. GI: Soft, nontender, non-distended  MS: 2+ edema; No deformity. Neuro:  Nonfocal  Psych: Normal affect   Labs     Chemistry Recent Labs Lab 09/16/17 1507 09/18/17 0335  NA 139 137  K 4.0 3.8  CL 103 103  CO2 29 24  GLUCOSE 292* 338*  BUN 15 17  CREATININE 1.46* 1.50*  CALCIUM 9.1 8.4*  PROT  --  5.7*  ALBUMIN  --  3.2*  AST  --  20  ALT  --  14  ALKPHOS  --  83  BILITOT  --  0.8  GFRNONAA 35* 33*  GFRAA 40* 38*  ANIONGAP  --  10     Hematology Recent Labs Lab 09/16/17 1507 09/18/17 0335  WBC 5.8 5.2  RBC 3.28* 3.27*  HGB 10.0* 9.7*  HCT 30.0* 31.0*  MCV 92 94.8  MCH 30.5 29.7  MCHC 33.3 31.3  RDW 13.6 14.2  PLT 154 144*    Cardiac EnzymesNo results for input(s): TROPONINI in the last 168 hours. No results for input(s): TROPIPOC in the last 168 hours.   BNPNo results for input(s): BNP, PROBNP in the last 168 hours.  DDimer No results for input(s): DDIMER in the last 168 hours.   Radiology    No results found.  Cardiac Studies   09/17/17 cardiac cath   RIGHT/LEFT HEART CATH AND CORONARY ANGIOGRAPHY  Conclusion   Conclusions: 1. Significant multivessel coronary artery disease, including sequential 40% ostial and 50% distal LMCA stenoses, sequential proximal and mid LAD stenoses of 50-60%, 40% distal LCx lesion, and 80% proximal to mid RCA stenosis. FFR of the LAD is hemodynamically significant (FFR 0.72). 2. Mildly elevated left and right heart filling pressures. 3. Normal Fick cardiac output  Recommendations: 1. Admit for medical optimization and cardiac surgery consultation for bypass, given the patient's multivessel CAD, diabetes mellitus, and reduced LVEF. 2. Gentle hydration tonight. Restart diuresis tomorrow based on renal function. 3. Aggressive secondary prevention   Echo 08/24/17  Study Conclusions  - Left ventricle: The cavity size was normal. There was moderate   concentric hypertrophy. Systolic function was mildly to   moderately reduced. The estimated ejection fraction was in the   range of 40% to 45%. Hypokinesis of all mid-apical  segments.   Apical akinesis. - Aortic valve: Transvalvular velocity was within the normal range.   There was no stenosis. There was no regurgitation. - Mitral valve: Mild thickening and calcification. Transvalvular   velocity was within the normal range. There was no evidence for   stenosis. There was trivial regurgitation. - Right ventricle: The cavity size was normal. Wall thickness was   normal. - Tricuspid valve: There was trivial regurgitation. - Pulmonary arteries: Systolic pressure was moderately increased.   PA peak pressure: 51 mm Hg (S).   Patient Profile     76 y.o. female with a past medical history significant for AML in remission since 2015, treated with doxurubicin, CKD III-IV baseline Cr 1.7, chronic anemia, and moderate COPD, recent acute CHF and NSTEMI and now cardiac cath arranged 09/16/17 for continued if not increased DOE and burning across shoulders.    Assessment & Plan    1. Crescendo angina with DOE and burning across shoulders. Presented for cardiac cath. Recent NSTEMI in Sept  2.  CAD  Significant multivessel CAD - admitted for TCTS consult for CABG and management of disease.  I have contacted TCTS -on lopressor 50 BID, Imdur 30 mg daily  3.  LV dysfunction with EF 40-45%  Recent admit for CHF. -resume lasix today will give 40 mg IV X 1 then possible po tomorrow  - + 475 since admit  4.  CKD -stage 3 Cr today 1.50 resume lasix continue to monitor  5.  HLD  No recent lipids noted on lipitor 40 mg. Check in AM  6.  DM-2 insulin depend.  Glucose 174 to 320 will ask diabetic coordinator to see, check [id number]    For questions or updates, please contact [place] Please consult [email or web address] for contact info under Cardiology/STEMI.      Signed, Cecilie Kicks, NP  09/18/2017, 10:58 AM    Attending Note:   The patient was seen and examined.  Agree with assessment and plan as noted above.  Changes made to the above note as needed.  Patient seen  and independently examined with Cecilie Kicks, NP .   We discussed all aspects of the encounter. I agree with the assessment and plan as stated above.  1.  CAD:  I have reviewed cath films She has been referred to TCTS. Pain free at present Renal function seems to have remained steady following cath  I have spent a total of 40 minutes with patient reviewing hospital  notes , telemetry, EKGs, labs and examining patient as well as establishing an assessment and plan that was discussed with the patient. > 50% of time was spent in direct patient care.   Thayer Headings, Brooke Bonito., MD, [facility] 09/18/2017, 3:29 PM [address]  [place] Pager [phone]

## 2017-09-18 NOTE — Care Management Note (Signed)
Case Management Note  Patient Details  Name: [name] MRN: [id number] Date of Birth: 1941-05-16  Subjective/Objective:  From home with spouse, pta indep, she states her spouse is able to assist her at home after dc if needed,  she has a PCP and medication coverage.  She is s/p heart cath/coronary angiography shows multivessel CAD, for heart surgery consult.                    Action/Plan: NCM will follow for dc needs.  Expected Discharge Date:                  Expected Discharge Plan:  Home/Self Care  In-House Referral:     Discharge planning Services  CM Consult  Post Acute Care Choice:    Choice offered to:     DME Arranged:    DME Agency:     HH Arranged:    HH Agency:     Status of Service:  In process, will continue to follow  If discussed at Long Length of Stay Meetings, dates discussed:    Additional Comments:  Zenon Mayo, RN 09/18/2017, 9:54 AM

## 2017-09-19 ENCOUNTER — Other Ambulatory Visit (HOSPITAL_COMMUNITY): Payer: Medicare HMO

## 2017-09-19 LAB — BASIC METABOLIC PANEL
ANION GAP: 7 (ref 5–15)
BUN: 19 mg/dL (ref 6–20)
CHLORIDE: 104 mmol/L (ref 101–111)
CO2: 27 mmol/L (ref 22–32)
Calcium: 8.3 mg/dL — ABNORMAL LOW (ref 8.9–10.3)
Creatinine, Ser: 1.64 mg/dL — ABNORMAL HIGH (ref 0.44–1.00)
GFR calc non Af Amer: 30 mL/min — ABNORMAL LOW (ref >60)
GFR, EST AFRICAN AMERICAN: 34 mL/min — AB (ref >60)
Glucose, Bld: 232 mg/dL — ABNORMAL HIGH (ref 65–99)
POTASSIUM: 3.6 mmol/L (ref 3.5–5.1)
SODIUM: 138 mmol/L (ref 135–145)

## 2017-09-19 LAB — GLUCOSE, CAPILLARY
GLUCOSE-CAPILLARY: 182 mg/dL — AB (ref 65–99)
GLUCOSE-CAPILLARY: 251 mg/dL — AB (ref 65–99)
GLUCOSE-CAPILLARY: 267 mg/dL — AB (ref 65–99)
GLUCOSE-CAPILLARY: 269 mg/dL — AB (ref 65–99)

## 2017-09-19 MED ORDER — ALUM & MAG HYDROXIDE-SIMETH 200-200-20 MG/5ML PO SUSP
30.0000 mL | ORAL | Status: DC | PRN
  Administered 2017-09-19: 30 mL via ORAL
  Filled 2017-09-19: qty 30

## 2017-09-19 MED ORDER — MAGNESIUM HYDROXIDE 400 MG/5ML PO SUSP: 30.0000 mL | Freq: Every day | ORAL | Status: DC | PRN

## 2017-09-19 MED ORDER — ISOSORBIDE MONONITRATE ER 60 MG PO TB24
60.0000 mg | ORAL_TABLET | Freq: Every day | ORAL | Status: DC
  Administered 2017-09-19 – 2017-09-22 (×4): 60 mg via ORAL
  Filled 2017-09-19 (×4): qty 1

## 2017-09-19 MED ORDER — METOPROLOL TARTRATE 25 MG PO TABS
25.0000 mg | ORAL_TABLET | Freq: Two times a day (BID) | ORAL | Status: DC
  Administered 2017-09-19 (×2): 25 mg via ORAL
  Filled 2017-09-19 (×2): qty 1

## 2017-09-19 MED ORDER — INSULIN GLARGINE 100 UNIT/ML ~~LOC~~ SOLN
24.0000 [IU] | Freq: Every day | SUBCUTANEOUS | Status: DC
  Administered 2017-09-19 – 2017-09-21 (×3): 24 [IU] via SUBCUTANEOUS
  Filled 2017-09-19 (×4): qty 0.24

## 2017-09-19 MED ORDER — NITROGLYCERIN 0.4 MG SL SUBL
SUBLINGUAL_TABLET | SUBLINGUAL | Status: DC
Start: 2017-09-19 — End: 2017-09-19
  Filled 2017-09-19: qty 2

## 2017-09-19 MED ORDER — LOPERAMIDE HCL 2 MG PO CAPS: 2.0000 mg | ORAL_CAPSULE | ORAL | Status: DC | PRN

## 2017-09-19 NOTE — Progress Notes (Signed)
Progress Note  Patient Name: [name] Date of Encounter: 09/19/2017  Primary Cardiologist: Dr. Acie Fredrickson  Subjective   No recurrent chest pain. Breathing at baseline but still with lower extremity edema.   Inpatient Medications    Scheduled Meds: . albuterol  2.5 mg Nebulization Once  . amLODipine  5 mg Oral Daily  . aspirin  81 mg Oral Daily  . atorvastatin  40 mg Oral q1800  . heparin  5,000 Units Subcutaneous Q8H  . insulin aspart  0-15 Units Subcutaneous TID AC & HS  . insulin glargine  20 Units Subcutaneous QHS  . isosorbide mononitrate  30 mg Oral Daily  . metoprolol tartrate  50 mg Oral BID  . sodium chloride flush  3 mL Intravenous Q12H   Continuous Infusions: . sodium chloride     PRN Meds: sodium chloride, acetaminophen, HYDROcodone-acetaminophen, LORazepam, nitroGLYCERIN, ondansetron (ZOFRAN) IV, sodium chloride flush   Vital Signs    Vitals:   09/19/17 0020 09/19/17 0250 09/19/17 0620 09/19/17 0723  BP: (!) 142/61 137/69 (!) 145/62 (!) 130/44  Pulse: 74 70 77 71  Resp: 20 18 (!) 26 (!) 26  Temp: 98.5 F (36.9 C)  97.9 F (36.6 C) (!) 97.5 F (36.4 C)  TempSrc: Oral  Oral Oral  SpO2: (!) 87% 96% 99% 94%  Weight:  225 lb 5 oz (102.2 kg)    Height:        Intake/Output Summary (Last 24 hours) at 09/19/17 0802 Last data filed at 09/19/17 0600  Gross per 24 hour  Intake              563 ml  Output             1300 ml  Net             -737 ml   Filed Weights   09/17/17 1046 09/19/17 0250  Weight: 228 lb (103.4 kg) 225 lb 5 oz (102.2 kg)    Telemetry    NSR, HR in 60's - 70's. Pause up to 2.04 seconds.  - Personally Reviewed  ECG   No new tracings.   Physical Exam   General: Well developed, well nourished, female appearing in no acute distress. Head: Normocephalic, atraumatic.  Neck: Supple without bruits, JVD not elevated. Lungs:  Resp regular and unlabored, CTA. Heart: RRR, S1, S2, no S3, S4, or murmur; no rub. Abdomen: Soft,  non-tender, non-distended with normoactive bowel sounds. No hepatomegaly. No rebound/guarding. No obvious abdominal masses. Extremities: No clubbing, cyanosis, 1+ pitting edema bilaterally. Distal pedal pulses are 2+ bilaterally. Neuro: Alert and oriented X 3. Moves all extremities spontaneously. Psych: Normal affect.  Labs    Chemistry Recent Labs Lab 09/16/17 1507 09/18/17 0335 09/19/17 0235  NA 139 137 138  K 4.0 3.8 3.6  CL 103 103 104  CO2 29 24 27   GLUCOSE 292* 338* 232*  BUN 15 17 19   CREATININE 1.46* 1.50* 1.64*  CALCIUM 9.1 8.4* 8.3*  PROT  --  5.7*  --   ALBUMIN  --  3.2*  --   AST  --  20  --   ALT  --  14  --   ALKPHOS  --  83  --   BILITOT  --  0.8  --   GFRNONAA 35* 33* 30*  GFRAA 40* 38* 34*  ANIONGAP  --  10 7     Hematology Recent Labs Lab 09/16/17 1507 09/18/17 0335  WBC 5.8 5.2  RBC  3.28* 3.27*  HGB 10.0* 9.7*  HCT 30.0* 31.0*  MCV 92 94.8  MCH 30.5 29.7  MCHC 33.3 31.3  RDW 13.6 14.2  PLT 154 144*    Cardiac EnzymesNo results for input(s): TROPONINI in the last 168 hours. No results for input(s): TROPIPOC in the last 168 hours.   BNPNo results for input(s): BNP, PROBNP in the last 168 hours.   DDimer No results for input(s): DDIMER in the last 168 hours.   Radiology    No results found.  Cardiac Studies   Echocardiogram: 08/24/2017 Study Conclusions  - Left ventricle: The cavity size was normal. There was moderate   concentric hypertrophy. Systolic function was mildly to   moderately reduced. The estimated ejection fraction was in the   range of 40% to 45%. Hypokinesis of all mid-apical segments.   Apical akinesis. - Aortic valve: Transvalvular velocity was within the normal range.   There was no stenosis. There was no regurgitation. - Mitral valve: Mild thickening and calcification. Transvalvular   velocity was within the normal range. There was no evidence for   stenosis. There was trivial regurgitation. - Right  ventricle: The cavity size was normal. Wall thickness was   normal. - Tricuspid valve: There was trivial regurgitation. - Pulmonary arteries: Systolic pressure was moderately increased.   PA peak pressure: 51 mm Hg (S).   Cardiac Catheterization: 09/17/2017 Conclusions: 1. Significant multivessel coronary artery disease, including sequential 40% ostial and 50% distal LMCA stenoses, sequential proximal and mid LAD stenoses of 50-60%, 40% distal LCx lesion, and 80% proximal to mid RCA stenosis. FFR of the LAD is hemodynamically significant (FFR 0.72). 2. Mildly elevated left and right heart filling pressures. 3. Normal Fick cardiac output  Recommendations: 1. Admit for medical optimization and cardiac surgery consultation for bypass, given the patient's multivessel CAD, diabetes mellitus, and reduced LVEF. 2. Gentle hydration tonight. Restart diuresis tomorrow based on renal function. 3. Aggressive secondary prevention.   Patient Profile     76 y.o. 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 Cath showed multivessel CAD. Admitted for further management.   Assessment & Plan    1. Unstable Angina/CAD   - was evaluated in the office for worsening chest pain and dyspnea on exertion. Was scheduled for an outpatient cath on 09/17/2017 which showed significant multivessel coronary artery disease, including sequential 40% ostial and 50% distal LMCA stenoses, sequential proximal and mid LAD stenoses of 50-60%, 40% distal LCx lesion, and 80% proximal to mid RCA stenosis. FFR of the LAD is hemodynamically significant (FFR 0.72). - TCTS consulted and she was felt to be a poor candidate for CABG due to the high-risk for respiratory failure, renal failure, and her limited functional status. Therefore, will need to consider PIC  with stent placement of the RCA on Monday and continued medical management.  - continue ASA, Lipitor 40mg  daily, and Imdur 30mg  daily. Currently on Lopressor 50mg  BID, will reduce to 25mg  BID in the setting of sinus pauses up to 2.04 seconds.   2. Chronic Systolic CHF - LV dysfunction with EF 40-45%. Given IV Lasix yesterday with mild bump in creatinine. Would hold further diuresis at this time in preparation for cath.   3. Stage 3 CKD - creatinine previously elevated to 2.35 in 08/2017. At 1.46 on admission, trending up to 1.64 today.  4. HLD - FLP ordered. Continue with Atorvastatin 40mg  daily.   5. IDDM - Hgb A1c ordered. Diabetes Coordinator following. Recommended increasing Lantus to 24 units HS and continuing Novolog moderate correction. Will adjust Lantus dosing.   Arna Medici , PA-C 8:02 AM 09/19/2017 Pager: [phone]   Patient seen and examined. Agree with assessment and plan. No recurrent chest pain. Pt was turned down for CABG.  I have reviewed the cath cines. High grade RCA stenosis is amenable to stenting.  LAD lesion is significant by FFR and may need intervention, ? staged. Will need to be cautious with contrast with reduced GFR.  Cr increased to 1.64 today.  Will hold lasix. She had several 2 second pauses while sleeping. Will reduce metoprolol and increase isosorbide to 60 mg. Pt is in agreement for attmpt at interventin.  Will tentatively set up for Monday, 10/15 depending on renal function. F/U lipids and  increase atorvastatin if LDL  > 70   Troy Sine, MD, [facility] 09/19/2017 8:28 AM

## 2017-09-19 NOTE — Plan of Care (Signed)
Problem: Activity: Goal: Ability to return to baseline activity level will improve Outcome: Progressing Patient ambulates in room independently with ease

## 2017-09-20 LAB — BASIC METABOLIC PANEL
Anion gap: 6 (ref 5–15)
BUN: 21 mg/dL — ABNORMAL HIGH (ref 6–20)
CALCIUM: 8.3 mg/dL — AB (ref 8.9–10.3)
CHLORIDE: 104 mmol/L (ref 101–111)
CO2: 28 mmol/L (ref 22–32)
CREATININE: 1.59 mg/dL — AB (ref 0.44–1.00)
GFR, EST AFRICAN AMERICAN: 36 mL/min — AB (ref >60)
GFR, EST NON AFRICAN AMERICAN: 31 mL/min — AB (ref >60)
Glucose, Bld: 203 mg/dL — ABNORMAL HIGH (ref 65–99)
Potassium: 3.9 mmol/L (ref 3.5–5.1)
SODIUM: 138 mmol/L (ref 135–145)

## 2017-09-20 LAB — GLUCOSE, CAPILLARY
GLUCOSE-CAPILLARY: 197 mg/dL — AB (ref 65–99)
GLUCOSE-CAPILLARY: 359 mg/dL — AB (ref 65–99)
Glucose-Capillary: 255 mg/dL — ABNORMAL HIGH (ref 65–99)
Glucose-Capillary: 323 mg/dL — ABNORMAL HIGH (ref 65–99)

## 2017-09-20 LAB — LIPID PANEL
CHOLESTEROL: 117 mg/dL (ref 0–200)
HDL: 28 mg/dL — ABNORMAL LOW (ref >40)
LDL Cholesterol: 44 mg/dL (ref 0–99)
TRIGLYCERIDES: 224 mg/dL — AB (ref <150)
Total CHOL/HDL Ratio: 4.2 RATIO
VLDL: 45 mg/dL — ABNORMAL HIGH (ref 0–40)

## 2017-09-20 LAB — HEMOGLOBIN A1C
Hgb A1c MFr Bld: 7.7 % — ABNORMAL HIGH (ref 4.8–5.6)
Mean Plasma Glucose: 174.29 mg/dL

## 2017-09-20 MED ORDER — SODIUM CHLORIDE 0.9 % IV SOLN: 250.0000 mL | INTRAVENOUS | Status: DC | PRN

## 2017-09-20 MED ORDER — MAGNESIUM CITRATE PO SOLN
1.0000 | Freq: Once | ORAL | Status: AC
  Administered 2017-09-20: 1 via ORAL
  Filled 2017-09-20: qty 296

## 2017-09-20 MED ORDER — ASPIRIN 81 MG PO CHEW
81.0000 mg | CHEWABLE_TABLET | Freq: Every day | ORAL | Status: DC
  Administered 2017-09-22: 12:00:00 81 mg via ORAL
  Filled 2017-09-20: qty 1

## 2017-09-20 MED ORDER — SODIUM CHLORIDE 0.9 % IV SOLN
INTRAVENOUS | Status: DC
  Administered 2017-09-21: 06:00:00 via INTRAVENOUS

## 2017-09-20 MED ORDER — SODIUM CHLORIDE 0.9% FLUSH: 3.0000 mL | Freq: Two times a day (BID) | INTRAVENOUS | Status: DC

## 2017-09-20 MED ORDER — BISACODYL 10 MG RE SUPP
10.0000 mg | Freq: Every day | RECTAL | Status: DC | PRN
  Administered 2017-09-20: 10 mg via RECTAL
  Filled 2017-09-20: qty 1

## 2017-09-20 MED ORDER — ASPIRIN 81 MG PO CHEW
81.0000 mg | CHEWABLE_TABLET | ORAL | Status: AC
  Administered 2017-09-21: 81 mg via ORAL
  Filled 2017-09-20: qty 1

## 2017-09-20 MED ORDER — SODIUM CHLORIDE 0.9% FLUSH: 3.0000 mL | INTRAVENOUS | Status: DC | PRN

## 2017-09-20 MED ORDER — METOPROLOL TARTRATE 25 MG PO TABS
25.0000 mg | ORAL_TABLET | Freq: Three times a day (TID) | ORAL | Status: DC
  Administered 2017-09-20 – 2017-09-22 (×7): 25 mg via ORAL
  Filled 2017-09-20 (×7): qty 1

## 2017-09-20 NOTE — Progress Notes (Signed)
Patient stated she has not had a bowel movement since admission. Has received milk of mag, prune juice, 1 rectal suppository and, magnesium citrate. Was successful, although it was difficult for her to pass. Patient states this happens at home and self-administers suppository and magnesium citrate. Will pass information to night shift RN.

## 2017-09-20 NOTE — Progress Notes (Signed)
Progress Note  Patient Name: [name] Date of Encounter: 09/20/2017  Primary Cardiologist: Nahser  Subjective   No chest pain on current meds.  Inpatient Medications    Scheduled Meds: . albuterol  2.5 mg Nebulization Once  . amLODipine  5 mg Oral Daily  . aspirin  81 mg Oral Daily  . atorvastatin  40 mg Oral q1800  . heparin  5,000 Units Subcutaneous Q8H  . insulin aspart  0-15 Units Subcutaneous TID AC & HS  . insulin glargine  24 Units Subcutaneous QHS  . isosorbide mononitrate  60 mg Oral Daily  . metoprolol tartrate  25 mg Oral BID  . sodium chloride flush  3 mL Intravenous Q12H   Continuous Infusions: . sodium chloride     PRN Meds: sodium chloride, acetaminophen, alum & mag hydroxide-simeth, HYDROcodone-acetaminophen, loperamide, LORazepam, magnesium hydroxide, nitroGLYCERIN, ondansetron (ZOFRAN) IV, sodium chloride flush   Vital Signs    Vitals:   09/19/17 0723 09/19/17 1104 09/19/17 2120 09/20/17 0449  BP: (!) 130/44 (!) 138/52 (!) 132/46 (!) 142/50  Pulse: 71 67 73 82  Resp: (!) 26 20 20 18   Temp: (!) 97.5 F (36.4 C) 98.6 F (37 C) 98.7 F (37.1 C) 98 F (36.7 C)  TempSrc: Oral Oral Oral Oral  SpO2: 94% 95% 90% 94%  Weight:    229 lb 4.8 oz (104 kg)  Height:       No intake or output data in the 24 hours ending 09/20/17 0744  I/O since admission:  N/A  Filed Weights   09/17/17 1046 09/19/17 0250 09/20/17 0449  Weight: 228 lb (103.4 kg) 225 lb 5 oz (102.2 kg) 229 lb 4.8 oz (104 kg)    Telemetry    Sinus at 80bpm- Personally Reviewed  ECG    09/18/17 ECG (independently read by me):sinus rhythm at 69 bpm.  T-wave inversion in leads 2 aVF, aVL LV2 through V6.  QTc interval increased at 481 ms.  Physical Exam   BP (!) 142/50 (BP Location: Left Arm)   Pulse 82   Temp 98 F (36.7 C) (Oral)   Resp 18   Ht 5\' 2"  (1.575 m)   Wt 229 lb 4.8 oz (104 kg)   SpO2 94%   BMI 41.94 kg/m  General: Alert, oriented, no distress.  Skin:  normal turgor, no rashes, warm and dry HEENT: Normocephalic, atraumatic. Pupils equal round and reactive to light; sclera anicteric; extraocular muscles intact;  Nose without nasal septal hypertrophy Mouth/Parynx benign; Mallinpatti scale 3 Neck:Thick neck  No JVD, no carotid bruits; normal carotid upstroke Lungs: clear to ausculatation and percussion; no wheezing or rales Chest wall: without tenderness to palpitation Heart: PMI not displaced, RRR, s1 s2 normal, 1/6 systolic murmur, no diastolic murmur, no rubs, gallops, thrills, or heaves Abdomen: obese; soft, nontender; no hepatosplenomehaly, BS+; abdominal aorta nontender and not dilated by palpation. Back: no CVA tenderness Pulses 2+ Musculoskeletal: full range of motion, normal strength, no joint deformities Extremities: no clubbing cyanosis or edema, Homan's sign negative  Neurologic: grossly nonfocal; Cranial nerves grossly wnl Psychologic: Normal mood and affect   Labs    Chemistry Recent Labs Lab 09/18/17 0335 09/19/17 0235 09/20/17 0306  NA 137 138 138  K 3.8 3.6 3.9  CL 103 104 104  CO2 24 27 28   GLUCOSE 338* 232* 203*  BUN 17 19 21*  CREATININE 1.50* 1.64* 1.59*  CALCIUM 8.4* 8.3* 8.3*  PROT 5.7*  --   --   ALBUMIN  3.2*  --   --   AST 20  --   --   ALT 14  --   --   ALKPHOS 83  --   --   BILITOT 0.8  --   --   GFRNONAA 33* 30* 31*  GFRAA 38* 34* 36*  ANIONGAP 10 7 6      Hematology Recent Labs Lab 09/16/17 1507 09/18/17 0335  WBC 5.8 5.2  RBC 3.28* 3.27*  HGB 10.0* 9.7*  HCT 30.0* 31.0*  MCV 92 94.8  MCH 30.5 29.7  MCHC 33.3 31.3  RDW 13.6 14.2  PLT 154 144*    Cardiac EnzymesNo results for input(s): TROPONINI in the last 168 hours. No results for input(s): TROPIPOC in the last 168 hours.   BNPNo results for input(s): BNP, PROBNP in the last 168 hours.   DDimer No results for input(s): DDIMER in the last 168 hours.   Lipid Panel     Component Value Date/Time   CHOL 117 09/20/2017 0306    TRIG 224 (H) 09/20/2017 0306   HDL 28 (L) 09/20/2017 0306   CHOLHDL 4.2 09/20/2017 0306   VLDL 45 (H) 09/20/2017 0306   LDLCALC 44 09/20/2017 0306    Radiology    No results found.  Cardiac Studies   Cardiac Cath Conclusions: 1. Significant multivessel coronary artery disease, including sequential 40% ostial and 50% distal LMCA stenoses, sequential proximal and mid LAD stenoses of 50-60%, 40% distal LCx lesion, and 80% proximal to mid RCA stenosis. FFR of the LAD is hemodynamically significant (FFR 0.72). 2. Mildly elevated left and right heart filling pressures. 3. Normal Fick cardiac output  Recommendations: 1. Admit for medical optimization and cardiac surgery consultation for bypass, given the patient's multivessel CAD, diabetes mellitus, and reduced LVEF. 2. Gentle hydration tonight. Restart diuresis tomorrow based on renal function. 3. Aggressive secondary prevention    Patient Profile     76 y.o. 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 Cath showed multivessel CAD. Admitted for further management.   Assessment & Plan    1. CAD/previous unstable angina: Data noted as above.  The patient was turned down and felt to be a poor candidate for CABG revascularization due to high risk for respiratory failure, renal failure and functional status.  Plan for PCI to RCA tomorrow and possibly the LAD depending upon contrast load. Currently asymptomatic on isosorbide 60 mg, atorvastatin, metoprolol 25 mg twice a day.  Her metoprolol dose was reduced yesterday due to previous bradycardia.  Resting pulse is now on the 80s.  Will increase to 25 mg every 8 hours.  2.  Ischemic cardiomyopathy/chronic systolic heart failure; EF 40-45%.  Appears euvolemic.  Lasix was discontinued yesterday due to renal  insufficiency.  3.  Stage III chronic kidney disease: September Cr 2.35, which was improved on admission of 1.46.  Increase to 1.64 yesterday, slightly improved to 1.59 today.  Will hydrate prior to catheterization. Patient had previously been on dialysis while being treated for AML  4. Hyperlipidemia: currently on atorvastatin 40 mg.  LDL is 44.  However, atherogenic profile with elevation of TG and VLDL.  5. Type 2 DM:  HbA1c 7.7  6. Anemia: H/H 9.7/31.0  7. Hypothyroid:  TSH 6.197 in September  The risks and benefits of a cardiac catheterization/PCI including, but not limited to, death, stroke,  MI, kidney damage and bleeding were discussed with the patient who indicates understanding and agrees to proceed.    Signed, Troy Sine, MD, [facility] 09/20/2017, 7:44 AM

## 2017-09-20 NOTE — Progress Notes (Signed)
Inpatient Diabetes Program Recommendations  AACE/ADA: New Consensus Statement on Inpatient Glycemic Control (2015)  Target Ranges:  Prepandial:   less than 140 mg/dL      Peak postprandial:   less than 180 mg/dL (1-2 hours)      Critically ill patients:  140 - 180 mg/dL   Lab Results  Component Value Date   GLUCAP 359 (H) 09/20/2017   HGBA1C 7.7 (H) 09/20/2017    Review of Glycemic Control  Post-prandial blood sugars elevated. FBS has improved with Lantus 24 units. Needs meal coverage insulin.  Inpatient Diabetes Program Recommendations:   Add Novolog 4 units tidwc for meal coverage insulin if pt eats > 50% meal.  Continue to follow.  Thank you. Lorenda Peck, RD, LDN, CDE Inpatient Diabetes Coordinator [phone]

## 2017-09-21 ENCOUNTER — Encounter (HOSPITAL_COMMUNITY)
Admission: AD | Disposition: A | Discharge status: Home / Self Care | Payer: Self-pay | Source: Ambulatory Visit | Attending: Internal Medicine

## 2017-09-21 DIAGNOSIS — I2510 Atherosclerotic heart disease of native coronary artery without angina pectoris: Secondary | ICD-10-CM

## 2017-09-21 DIAGNOSIS — I25110 Atherosclerotic heart disease of native coronary artery with unstable angina pectoris: Secondary | ICD-10-CM

## 2017-09-21 HISTORY — PX: CORONARY STENT INTERVENTION: CATH118234

## 2017-09-21 LAB — BASIC METABOLIC PANEL
ANION GAP: 7 (ref 5–15)
BUN: 19 mg/dL (ref 6–20)
CO2: 28 mmol/L (ref 22–32)
Calcium: 8.7 mg/dL — ABNORMAL LOW (ref 8.9–10.3)
Chloride: 103 mmol/L (ref 101–111)
Creatinine, Ser: 1.52 mg/dL — ABNORMAL HIGH (ref 0.44–1.00)
GFR calc Af Amer: 38 mL/min — ABNORMAL LOW (ref >60)
GFR, EST NON AFRICAN AMERICAN: 32 mL/min — AB (ref >60)
GLUCOSE: 135 mg/dL — AB (ref 65–99)
POTASSIUM: 3.8 mmol/L (ref 3.5–5.1)
Sodium: 138 mmol/L (ref 135–145)

## 2017-09-21 LAB — CBC WITH DIFFERENTIAL/PLATELET
BASOS ABS: 0 10*3/uL (ref 0.0–0.1)
Basophils Relative: 0 %
Eosinophils Absolute: 0.2 10*3/uL (ref 0.0–0.7)
Eosinophils Relative: 3 %
HEMATOCRIT: 31.7 % — AB (ref 36.0–46.0)
Hemoglobin: 9.9 g/dL — ABNORMAL LOW (ref 12.0–15.0)
LYMPHS PCT: 33 %
Lymphs Abs: 1.8 10*3/uL (ref 0.7–4.0)
MCH: 29.6 pg (ref 26.0–34.0)
MCHC: 31.2 g/dL (ref 30.0–36.0)
MCV: 94.9 fL (ref 78.0–100.0)
MONO ABS: 0.4 10*3/uL (ref 0.1–1.0)
Monocytes Relative: 8 %
NEUTROS ABS: 3.1 10*3/uL (ref 1.7–7.7)
Neutrophils Relative %: 56 %
Platelets: 175 10*3/uL (ref 150–400)
RBC: 3.34 MIL/uL — ABNORMAL LOW (ref 3.87–5.11)
RDW: 14.6 % (ref 11.5–15.5)
WBC: 5.5 10*3/uL (ref 4.0–10.5)

## 2017-09-21 LAB — GLUCOSE, CAPILLARY
GLUCOSE-CAPILLARY: 146 mg/dL — AB (ref 65–99)
GLUCOSE-CAPILLARY: 351 mg/dL — AB (ref 65–99)
GLUCOSE-CAPILLARY: 307 mg/dL — AB (ref 65–99)

## 2017-09-21 LAB — POCT ACTIVATED CLOTTING TIME: ACTIVATED CLOTTING TIME: 670 s

## 2017-09-21 SURGERY — CORONARY STENT INTERVENTION
Anesthesia: LOCAL

## 2017-09-21 MED ORDER — MIDAZOLAM HCL 2 MG/2ML IJ SOLN
INTRAMUSCULAR | Status: DC | PRN
  Administered 2017-09-21 (×3): 1 mg via INTRAVENOUS

## 2017-09-21 MED ORDER — HEPARIN (PORCINE) IN NACL 2-0.9 UNIT/ML-% IJ SOLN
INTRAMUSCULAR | Status: AC | PRN
  Administered 2017-09-21: 1000 mL

## 2017-09-21 MED ORDER — HYDRALAZINE HCL 20 MG/ML IJ SOLN: 5.0000 mg | INTRAMUSCULAR | Status: AC | PRN

## 2017-09-21 MED ORDER — LIDOCAINE HCL 2 % IJ SOLN
INTRAMUSCULAR | Status: AC
  Filled 2017-09-21: qty 10

## 2017-09-21 MED ORDER — MIDAZOLAM HCL 2 MG/2ML IJ SOLN
INTRAMUSCULAR | Status: AC
Start: 2017-09-21 — End: 2017-09-21
  Filled 2017-09-21: qty 2

## 2017-09-21 MED ORDER — BIVALIRUDIN TRIFLUOROACETATE 250 MG IV SOLR
INTRAVENOUS | Status: AC
  Filled 2017-09-21: qty 250

## 2017-09-21 MED ORDER — IOPAMIDOL (ISOVUE-370) INJECTION 76%
INTRAVENOUS | Status: AC
  Filled 2017-09-21: qty 100

## 2017-09-21 MED ORDER — LABETALOL HCL 5 MG/ML IV SOLN: 10.0000 mg | INTRAVENOUS | Status: AC | PRN

## 2017-09-21 MED ORDER — FENTANYL CITRATE (PF) 100 MCG/2ML IJ SOLN
INTRAMUSCULAR | Status: DC | PRN
  Administered 2017-09-21 (×2): 25 ug via INTRAVENOUS

## 2017-09-21 MED ORDER — SODIUM CHLORIDE 0.9 % IV SOLN: 250.0000 mL | INTRAVENOUS | Status: DC | PRN

## 2017-09-21 MED ORDER — ASPIRIN 81 MG PO CHEW: 81.0000 mg | CHEWABLE_TABLET | Freq: Every day | ORAL | Status: DC

## 2017-09-21 MED ORDER — VERAPAMIL HCL 2.5 MG/ML IV SOLN
INTRAVENOUS | Status: AC
  Filled 2017-09-21: qty 2

## 2017-09-21 MED ORDER — NITROGLYCERIN 1 MG/10 ML FOR IR/CATH LAB
INTRA_ARTERIAL | Status: AC
  Filled 2017-09-21: qty 10

## 2017-09-21 MED ORDER — SODIUM CHLORIDE 0.9 % IV SOLN: INTRAVENOUS | Status: DC

## 2017-09-21 MED ORDER — SODIUM CHLORIDE 0.9 % IV SOLN
INTRAVENOUS | Status: AC | PRN
  Administered 2017-09-21 (×2): 1.75 mg/kg/h via INTRAVENOUS

## 2017-09-21 MED ORDER — BIVALIRUDIN BOLUS VIA INFUSION - CUPID
INTRAVENOUS | Status: DC | PRN
  Administered 2017-09-21: 78.075 mg via INTRAVENOUS

## 2017-09-21 MED ORDER — LIDOCAINE HCL (PF) 1 % IJ SOLN: INTRAMUSCULAR | Status: DC | PRN

## 2017-09-21 MED ORDER — ONDANSETRON HCL 4 MG/2ML IJ SOLN: 4.0000 mg | Freq: Four times a day (QID) | INTRAMUSCULAR | Status: DC | PRN

## 2017-09-21 MED ORDER — IOPAMIDOL (ISOVUE-370) INJECTION 76%
INTRAVENOUS | Status: DC | PRN
  Administered 2017-09-21: 155 mL via INTRA_ARTERIAL

## 2017-09-21 MED ORDER — CLOPIDOGREL BISULFATE 75 MG PO TABS
75.0000 mg | ORAL_TABLET | Freq: Every day | ORAL | Status: DC
  Administered 2017-09-22: 09:00:00 75 mg via ORAL
  Filled 2017-09-21: qty 1

## 2017-09-21 MED ORDER — ACETAMINOPHEN 325 MG PO TABS: 650.0000 mg | ORAL_TABLET | ORAL | Status: DC | PRN

## 2017-09-21 MED ORDER — SODIUM CHLORIDE 0.9% FLUSH
3.0000 mL | Freq: Two times a day (BID) | INTRAVENOUS | Status: DC
  Administered 2017-09-21: 3 mL via INTRAVENOUS

## 2017-09-21 MED ORDER — NITROGLYCERIN 1 MG/10 ML FOR IR/CATH LAB
INTRA_ARTERIAL | Status: DC | PRN
  Administered 2017-09-21: 200 ug
  Administered 2017-09-21: 200 ug via INTRACORONARY

## 2017-09-21 MED ORDER — SODIUM CHLORIDE 0.9% FLUSH: 3.0000 mL | INTRAVENOUS | Status: DC | PRN

## 2017-09-21 MED ORDER — SODIUM CHLORIDE 0.9 % IV SOLN
INTRAVENOUS | Status: DC
  Administered 2017-09-21: 12:00:00 via INTRAVENOUS

## 2017-09-21 MED ORDER — CLOPIDOGREL BISULFATE 300 MG PO TABS
600.0000 mg | ORAL_TABLET | Freq: Once | ORAL | Status: AC
  Administered 2017-09-21: 600 mg via ORAL
  Filled 2017-09-21: qty 2

## 2017-09-21 MED ORDER — LIDOCAINE HCL 2 % IJ SOLN
INTRAMUSCULAR | Status: DC | PRN
Start: 2017-09-21 — End: 2017-09-21
  Administered 2017-09-21: 2 mL

## 2017-09-21 MED ORDER — HEPARIN (PORCINE) IN NACL 2-0.9 UNIT/ML-% IJ SOLN
INTRAMUSCULAR | Status: AC
  Filled 2017-09-21: qty 1000

## 2017-09-21 MED ORDER — FENTANYL CITRATE (PF) 100 MCG/2ML IJ SOLN
INTRAMUSCULAR | Status: AC
  Filled 2017-09-21: qty 2

## 2017-09-21 MED ORDER — DIAZEPAM 5 MG PO TABS: 5.0000 mg | ORAL_TABLET | Freq: Four times a day (QID) | ORAL | Status: DC | PRN

## 2017-09-21 MED ORDER — ANGIOPLASTY BOOK
Freq: Once | Status: AC
  Administered 2017-09-21: 22:00:00
  Filled 2017-09-21: qty 1

## 2017-09-21 MED ORDER — MIDAZOLAM HCL 2 MG/2ML IJ SOLN
INTRAMUSCULAR | Status: AC
  Filled 2017-09-21: qty 2

## 2017-09-21 MED ORDER — ATORVASTATIN CALCIUM 80 MG PO TABS
80.0000 mg | ORAL_TABLET | Freq: Every day | ORAL | Status: DC
  Administered 2017-09-21: 18:00:00 80 mg via ORAL
  Filled 2017-09-21: qty 1

## 2017-09-21 MED ORDER — VERAPAMIL HCL 2.5 MG/ML IV SOLN
INTRAVENOUS | Status: DC | PRN
  Administered 2017-09-21: 10 mL via INTRA_ARTERIAL

## 2017-09-21 SURGICAL SUPPLY — 18 items
BALLN EMERGE MR 2.0X15 (BALLOONS) ×2
BALLN SAPPHIRE ~~LOC~~ 2.75X15 (BALLOONS) ×1 IMPLANT
BALLOON EMERGE MR 2.0X15 (BALLOONS) IMPLANT
CATH VISTA GUIDE 6FR JR4 (CATHETERS) ×1 IMPLANT
DEVICE RAD COMP TR BAND LRG (VASCULAR PRODUCTS) ×1 IMPLANT
GLIDESHEATH SLEND SS 6F .021 (SHEATH) ×1 IMPLANT
GUIDEWIRE INQWIRE 1.5J.035X260 (WIRE) IMPLANT
HOVERMATT SINGLE USE (MISCELLANEOUS) ×1 IMPLANT
INQWIRE 1.5J .035X260CM (WIRE) ×2
KIT ENCORE 26 ADVANTAGE (KITS) ×1 IMPLANT
KIT HEART LEFT (KITS) ×2 IMPLANT
PACK CARDIAC CATHETERIZATION (CUSTOM PROCEDURE TRAY) ×2 IMPLANT
STENT SIERRA 2.50 X 23 MM (Permanent Stent) ×1 IMPLANT
TRANSDUCER W/STOPCOCK (MISCELLANEOUS) ×2 IMPLANT
TUBING CIL FLEX 10 FLL-RA (TUBING) ×2 IMPLANT
WIRE ASAHI PROWATER 180CM (WIRE) ×1 IMPLANT
WIRE MARVEL STR TIP 190CM (WIRE) ×1 IMPLANT
WIRE PT2 MS 185 (WIRE) ×1 IMPLANT

## 2017-09-21 NOTE — Interval H&P Note (Signed)
Cath Lab Visit (complete for each Cath Lab visit)  Clinical Evaluation Leading to the Procedure:   ACS: No.  Non-ACS:    Anginal Classification: CCS III  Anti-ischemic medical therapy: Maximal Therapy (2 or more classes of medications)  Non-Invasive Test Results: No non-invasive testing performed  Prior CABG: No previous CABG      History and Physical Interval Note:  09/21/2017 9:33 AM  [name]  has presented today for surgery, with the diagnosis of unstable angina  The various methods of treatment have been discussed with the patient and family. After consideration of risks, benefits and other options for treatment, the patient has consented to  Procedure(s): CORONARY STENT INTERVENTION (N/A) as a surgical intervention .  The patient's history has been reviewed, patient examined, no change in status, stable for surgery.  I have reviewed the patient's chart and labs.  Questions were answered to the patient's satisfaction.     Shelva Majestic

## 2017-09-21 NOTE — Care Management Important Message (Signed)
Important Message  Patient Details  Name: [name] MRN: [id number] Date of Birth: [birth year]   Medicare Important Message Given:  Yes    Orbie Pyo 09/21/2017, 2:27 PM

## 2017-09-21 NOTE — Progress Notes (Signed)
Progress Note  Patient Name: [name] Date of Encounter: 09/21/2017  Primary Cardiologist: Dr. Acie Fredrickson  Subjective   Patient is feeling well; denies chest pain, SOB, and palpitations. She reports lower extremity swelling  Inpatient Medications    Scheduled Meds: . albuterol  2.5 mg Nebulization Once  . amLODipine  5 mg Oral Daily  . [START ON 09/22/2017] aspirin  81 mg Oral Daily  . atorvastatin  40 mg Oral q1800  . heparin  5,000 Units Subcutaneous Q8H  . insulin aspart  0-15 Units Subcutaneous TID AC & HS  . insulin glargine  24 Units Subcutaneous QHS  . isosorbide mononitrate  60 mg Oral Daily  . metoprolol tartrate  25 mg Oral Q8H  . sodium chloride flush  3 mL Intravenous Q12H  . sodium chloride flush  3 mL Intravenous Q12H   Continuous Infusions: . sodium chloride    . sodium chloride    . sodium chloride 50 mL/hr at 09/21/17 0602   PRN Meds: sodium chloride, sodium chloride, acetaminophen, alum & mag hydroxide-simeth, bisacodyl, HYDROcodone-acetaminophen, loperamide, LORazepam, magnesium hydroxide, nitroGLYCERIN, ondansetron (ZOFRAN) IV, sodium chloride flush, sodium chloride flush   Vital Signs    Vitals:   09/19/17 2120 09/20/17 0449 09/20/17 2048 09/21/17 0527  BP: (!) 132/46 (!) 142/50 (!) 134/43 (!) 146/58  Pulse: 73 82 77 86  Resp: 20 18 18    Temp: 98.7 F (37.1 C) 98 F (36.7 C) 97.7 F (36.5 C) 98.4 F (36.9 C)  TempSrc: Oral Oral Oral Oral  SpO2: 90% 94% 94% 96%  Weight:  229 lb 4.8 oz (104 kg)  229 lb 8 oz (104.1 kg)  Height:        Intake/Output Summary (Last 24 hours) at 09/21/17 0711 Last data filed at 09/20/17 1400  Gross per 24 hour  Intake              240 ml  Output                0 ml  Net              240 ml   Filed Weights   09/19/17 0250 09/20/17 0449 09/21/17 0527  Weight: 225 lb 5 oz (102.2 kg) 229 lb 4.8 oz (104 kg) 229 lb 8 oz (104.1 kg)     Physical Exam   General: Well developed, well nourished, female  appearing in no acute distress. Head: Normocephalic, atraumatic.  Neck: Supple without bruits, no JVD Lungs:  Resp regular and unlabored, CTA but diminished in bases. Heart: RRR, S1, S2, no S3, S4, or murmur; no rub. Abdomen: Soft, non-tender, non-distended with normoactive bowel sounds. No hepatomegaly. No rebound/guarding. No obvious abdominal masses. Extremities: No clubbing, cyanosis, 1+edema. Distal pedal pulses are 2+ bilaterally. Neuro: Alert and oriented X 3. Moves all extremities spontaneously. Psych: Normal affect.  Labs    Chemistry Recent Labs Lab 09/18/17 0335 09/19/17 0235 09/20/17 0306 09/21/17 0339  NA 137 138 138 138  K 3.8 3.6 3.9 3.8  CL 103 104 104 103  CO2 24 27 28 28   GLUCOSE 338* 232* 203* 135*  BUN 17 19 21* 19  CREATININE 1.50* 1.64* 1.59* 1.52*  CALCIUM 8.4* 8.3* 8.3* 8.7*  PROT 5.7*  --   --   --   ALBUMIN 3.2*  --   --   --   AST 20  --   --   --   ALT 14  --   --   --  ALKPHOS 83  --   --   --   BILITOT 0.8  --   --   --   GFRNONAA 33* 30* 31* 32*  GFRAA 38* 34* 36* 38*  ANIONGAP 10 7 6 7      Hematology Recent Labs Lab 09/16/17 1507 09/18/17 0335 09/21/17 0339  WBC 5.8 5.2 5.5  RBC 3.28* 3.27* 3.34*  HGB 10.0* 9.7* 9.9*  HCT 30.0* 31.0* 31.7*  MCV 92 94.8 94.9  MCH 30.5 29.7 29.6  MCHC 33.3 31.3 31.2  RDW 13.6 14.2 14.6  PLT 154 144* 175    Cardiac EnzymesNo results for input(s): TROPONINI in the last 168 hours. No results for input(s): TROPIPOC in the last 168 hours.   BNPNo results for input(s): BNP, PROBNP in the last 168 hours.   DDimer No results for input(s): DDIMER in the last 168 hours.   Radiology    No results found.   Telemetry    Sinus  - Personally Reviewed  ECG    No new tracings - Personally Reviewed   Cardiac Studies   Left heart cath 09/17/17: Conclusions: 1. Significant multivessel coronary artery disease, including sequential 40% ostial and 50% distal LMCA stenoses, sequential proximal and  mid LAD stenoses of 50-60%, 40% distal LCx lesion, and 80% proximal to mid RCA stenosis. FFR of the LAD is hemodynamically significant (FFR 0.72). 2. Mildly elevated left and right heart filling pressures. 3. Normal Fick cardiac output  Recommendations: 1. Admit for medical optimization and cardiac surgery consultation for bypass, given the patient's multivessel CAD, diabetes mellitus, and reduced LVEF. 2. Gentle hydration tonight. Restart diuresis tomorrow based on renal function. 3. Aggressive secondary prevention.   Echocardiogram 08/24/17: Study Conclusions - Left ventricle: The cavity size was normal. There was moderate   concentric hypertrophy. Systolic function was mildly to   moderately reduced. The estimated ejection fraction was in the   range of 40% to 45%. Hypokinesis of all mid-apical segments.   Apical akinesis. - Aortic valve: Transvalvular velocity was within the normal range.   There was no stenosis. There was no regurgitation. - Mitral valve: Mild thickening and calcification. Transvalvular   velocity was within the normal range. There was no evidence for   stenosis. There was trivial regurgitation. - Right ventricle: The cavity size was normal. Wall thickness was   normal. - Tricuspid valve: There was trivial regurgitation. - Pulmonary arteries: Systolic pressure was moderately increased.   PA peak pressure: 51 mm Hg (S).  Patient Profile     76 y.o. female w/ PMH of Stage 3-4 CKD, COPD, chronic anemia, and AML (in remission since 2015) who presented to [facility] on 09/17/2017 for planned cardiac catheterization after recent evaluation for worsening unstable angina. Cath showed multivessel CAD, turned down by TCTS.    Assessment & Plan    1. CAD - cath with multivessel disease - TCTS consulted and will not proceed with CABG - pt is scheduled for PCI to RCA and possible LAD - continue imdur and lopresser 25 mg q8hr - continue ASA and statin  2. Ischemic  cardiomyopathy, chronic systolic heart failure with LVEF of 40-45% - sCr trending back down after lasix D/C'ed yesterday : 1.52 (1.59) - pt appears has 1+ pitting edema on lower extremities - restart lasix when recovered from cath dye load  3. CKD stage III - creatinine baseline appears to be near 1.4 - sCr trending down - continue holding lasix today  4. HLD - continue statin  5. DM type 2 - per primary team   Signed, Ledora Bottcher , PA-C 7:11 AM 09/21/2017 Pager: [phone]  Personally seen and examined. Agree with above.  No CP, no SOB.   76 year old with severe CAD, not wishing to pursue CABG. Post PCI to RCA.  Residual LAD lesion Hydration given CKD 3. Future plans to perform LAD stent. Given dye load, was not performed today  Cath site with mild ecchymosis. RRR, lungs CTAB, mild LE edema.  Obese  Candee Furbish, MD

## 2017-09-21 NOTE — H&P (View-Only) (Signed)
Progress Note  Patient Name: [name] Date of Encounter: 09/20/2017  Primary Cardiologist: Nahser  Subjective   No chest pain on current meds.  Inpatient Medications    Scheduled Meds: . albuterol  2.5 mg Nebulization Once  . amLODipine  5 mg Oral Daily  . aspirin  81 mg Oral Daily  . atorvastatin  40 mg Oral q1800  . heparin  5,000 Units Subcutaneous Q8H  . insulin aspart  0-15 Units Subcutaneous TID AC & HS  . insulin glargine  24 Units Subcutaneous QHS  . isosorbide mononitrate  60 mg Oral Daily  . metoprolol tartrate  25 mg Oral BID  . sodium chloride flush  3 mL Intravenous Q12H   Continuous Infusions: . sodium chloride     PRN Meds: sodium chloride, acetaminophen, alum & mag hydroxide-simeth, HYDROcodone-acetaminophen, loperamide, LORazepam, magnesium hydroxide, nitroGLYCERIN, ondansetron (ZOFRAN) IV, sodium chloride flush   Vital Signs    Vitals:   09/19/17 0723 09/19/17 1104 09/19/17 2120 09/20/17 0449  BP: (!) 130/44 (!) 138/52 (!) 132/46 (!) 142/50  Pulse: 71 67 73 82  Resp: (!) 26 20 20 18   Temp: (!) 97.5 F (36.4 C) 98.6 F (37 C) 98.7 F (37.1 C) 98 F (36.7 C)  TempSrc: Oral Oral Oral Oral  SpO2: 94% 95% 90% 94%  Weight:    229 lb 4.8 oz (104 kg)  Height:       No intake or output data in the 24 hours ending 09/20/17 0744  I/O since admission:  N/A  Filed Weights   09/17/17 1046 09/19/17 0250 09/20/17 0449  Weight: 228 lb (103.4 kg) 225 lb 5 oz (102.2 kg) 229 lb 4.8 oz (104 kg)    Telemetry    Sinus at 80bpm- Personally Reviewed  ECG    09/18/17 ECG (independently read by me):sinus rhythm at 69 bpm.  T-wave inversion in leads 2 aVF, aVL LV2 through V6.  QTc interval increased at 481 ms.  Physical Exam   BP (!) 142/50 (BP Location: Left Arm)   Pulse 82   Temp 98 F (36.7 C) (Oral)   Resp 18   Ht 5\' 2"  (1.575 m)   Wt 229 lb 4.8 oz (104 kg)   SpO2 94%   BMI 41.94 kg/m  General: Alert, oriented, no distress.  Skin:  normal turgor, no rashes, warm and dry HEENT: Normocephalic, atraumatic. Pupils equal round and reactive to light; sclera anicteric; extraocular muscles intact;  Nose without nasal septal hypertrophy Mouth/Parynx benign; Mallinpatti scale 3 Neck:Thick neck  No JVD, no carotid bruits; normal carotid upstroke Lungs: clear to ausculatation and percussion; no wheezing or rales Chest wall: without tenderness to palpitation Heart: PMI not displaced, RRR, s1 s2 normal, 1/6 systolic murmur, no diastolic murmur, no rubs, gallops, thrills, or heaves Abdomen: obese; soft, nontender; no hepatosplenomehaly, BS+; abdominal aorta nontender and not dilated by palpation. Back: no CVA tenderness Pulses 2+ Musculoskeletal: full range of motion, normal strength, no joint deformities Extremities: no clubbing cyanosis or edema, Homan's sign negative  Neurologic: grossly nonfocal; Cranial nerves grossly wnl Psychologic: Normal mood and affect   Labs    Chemistry Recent Labs Lab 09/18/17 0335 09/19/17 0235 09/20/17 0306  NA 137 138 138  K 3.8 3.6 3.9  CL 103 104 104  CO2 24 27 28   GLUCOSE 338* 232* 203*  BUN 17 19 21*  CREATININE 1.50* 1.64* 1.59*  CALCIUM 8.4* 8.3* 8.3*  PROT 5.7*  --   --   ALBUMIN  3.2*  --   --   AST 20  --   --   ALT 14  --   --   ALKPHOS 83  --   --   BILITOT 0.8  --   --   GFRNONAA 33* 30* 31*  GFRAA 38* 34* 36*  ANIONGAP 10 7 6      Hematology Recent Labs Lab 09/16/17 1507 09/18/17 0335  WBC 5.8 5.2  RBC 3.28* 3.27*  HGB 10.0* 9.7*  HCT 30.0* 31.0*  MCV 92 94.8  MCH 30.5 29.7  MCHC 33.3 31.3  RDW 13.6 14.2  PLT 154 144*    Cardiac EnzymesNo results for input(s): TROPONINI in the last 168 hours. No results for input(s): TROPIPOC in the last 168 hours.   BNPNo results for input(s): BNP, PROBNP in the last 168 hours.   DDimer No results for input(s): DDIMER in the last 168 hours.   Lipid Panel     Component Value Date/Time   CHOL 117 09/20/2017 0306    TRIG 224 (H) 09/20/2017 0306   HDL 28 (L) 09/20/2017 0306   CHOLHDL 4.2 09/20/2017 0306   VLDL 45 (H) 09/20/2017 0306   LDLCALC 44 09/20/2017 0306    Radiology    No results found.  Cardiac Studies   Cardiac Cath Conclusions: 1. Significant multivessel coronary artery disease, including sequential 40% ostial and 50% distal LMCA stenoses, sequential proximal and mid LAD stenoses of 50-60%, 40% distal LCx lesion, and 80% proximal to mid RCA stenosis. FFR of the LAD is hemodynamically significant (FFR 0.72). 2. Mildly elevated left and right heart filling pressures. 3. Normal Fick cardiac output  Recommendations: 1. Admit for medical optimization and cardiac surgery consultation for bypass, given the patient's multivessel CAD, diabetes mellitus, and reduced LVEF. 2. Gentle hydration tonight. Restart diuresis tomorrow based on renal function. 3. Aggressive secondary prevention    Patient Profile     76 y.o. female w/ PMH of Stage 3-4 CKDm COPD, chronic anemia, and AML (in remission since 2015) who presented to [facility] on 09/17/2017 for planned cardiac catheterization after recent evaluation for worsening unstable angina. Cath showed multivessel CAD. Admitted for further management.   Assessment & Plan    1. CAD/previous unstable angina: Data noted as above.  The patient was turned down and felt to be a poor candidate for CABG revascularization due to high risk for respiratory failure, renal failure and functional status.  Plan for PCI to RCA tomorrow and possibly the LAD depending upon contrast load. Currently asymptomatic on isosorbide 60 mg, atorvastatin, metoprolol 25 mg twice a day.  Her metoprolol dose was reduced yesterday due to previous bradycardia.  Resting pulse is now on the 80s.  Will increase to 25 mg every 8 hours.  2.  Ischemic cardiomyopathy/chronic systolic heart failure; EF 40-45%.  Appears euvolemic.  Lasix was discontinued yesterday due to renal  insufficiency.  3.  Stage III chronic kidney disease: September Cr 2.35, which was improved on admission of 1.46.  Increase to 1.64 yesterday, slightly improved to 1.59 today.  Will hydrate prior to catheterization. Patient had previously been on dialysis while being treated for AML  4. Hyperlipidemia: currently on atorvastatin 40 mg.  LDL is 44.  However, atherogenic profile with elevation of TG and VLDL.  5. Type 2 DM:  HbA1c 7.7  6. Anemia: H/H 9.7/31.0  7. Hypothyroid:  TSH 6.197 in September  The risks and benefits of a cardiac catheterization/PCI including, but not limited to, death, stroke,  MI, kidney damage and bleeding were discussed with the patient who indicates understanding and agrees to proceed.    Signed, Troy Sine, MD, [facility] 09/20/2017, 7:44 AM

## 2017-09-21 NOTE — Progress Notes (Signed)
Notified by CCMD that patient has 6 bts non sustained VT. Pt asymptomatic. Will continue to monitor. Jessie Foot, RN

## 2017-09-21 NOTE — Progress Notes (Signed)
Inpatient Diabetes Program Recommendations  AACE/ADA: New Consensus Statement on Inpatient Glycemic Control (2015)  Target Ranges:  Prepandial:   less than 140 mg/dL      Peak postprandial:   less than 180 mg/dL (1-2 hours)      Critically ill patients:  140 - 180 mg/dL   Lab Results  Component Value Date   GLUCAP 146 (H) 09/21/2017   HGBA1C 7.7 (H) 09/20/2017    Review of Glycemic ControlResults for MONA, AYARS (MRN [id number]) as of 09/21/2017 12:05  Ref. Range 09/19/2017 06:48 09/19/2017 11:40 09/19/2017 17:06 09/19/2017 21:18 09/20/2017 07:47 09/20/2017 12:10 09/20/2017 17:13 09/20/2017 20:46 09/21/2017 07:42  Glucose-Capillary Latest Ref Range: 65 - 99 mg/dL 182 (H) 251 (H) 267 (H) 269 (H) 197 (H) 359 (H) 255 (H) 323 (H) 146 (H)   Diabetes history: Type 2 DM Outpatient Diabetes medications: Toujeo 20 units q HS, Humalog kwik pen 71-150- 6 units, 151-200-8 units, Blood sugar>200-10 units Current orders for Inpatient glycemic control:  Novolog moderate tid with meals and HS Lantus 24 units q HS Inpatient Diabetes Program Recommendations:   Please add Novolog meal coverage 4 units tid with meals- hold if patient eats less than 50%.    Thanks, Adah Perl, RN, BC-ADM Inpatient Diabetes Coordinator Pager [phone] (8a-5p)

## 2017-09-22 ENCOUNTER — Encounter (HOSPITAL_COMMUNITY): Payer: Self-pay | Admitting: Cardiovascular Disease

## 2017-09-22 ENCOUNTER — Encounter (HOSPITAL_COMMUNITY)
Admission: AD | Disposition: A | Discharge status: Home / Self Care | Payer: Self-pay | Source: Ambulatory Visit | Attending: Internal Medicine

## 2017-09-22 ENCOUNTER — Telehealth: Payer: Self-pay | Admitting: Cardiovascular Disease

## 2017-09-22 DIAGNOSIS — Z955 Presence of coronary angioplasty implant and graft: Secondary | ICD-10-CM

## 2017-09-22 LAB — BASIC METABOLIC PANEL
ANION GAP: 6 (ref 5–15)
BUN: 16 mg/dL (ref 6–20)
CHLORIDE: 106 mmol/L (ref 101–111)
CO2: 27 mmol/L (ref 22–32)
Calcium: 8.2 mg/dL — ABNORMAL LOW (ref 8.9–10.3)
Creatinine, Ser: 1.4 mg/dL — ABNORMAL HIGH (ref 0.44–1.00)
GFR calc Af Amer: 41 mL/min — ABNORMAL LOW (ref >60)
GFR, EST NON AFRICAN AMERICAN: 36 mL/min — AB (ref >60)
GLUCOSE: 97 mg/dL (ref 65–99)
POTASSIUM: 4 mmol/L (ref 3.5–5.1)
Sodium: 139 mmol/L (ref 135–145)

## 2017-09-22 LAB — GLUCOSE, CAPILLARY
Glucose-Capillary: 108 mg/dL — ABNORMAL HIGH (ref 65–99)
Glucose-Capillary: 240 mg/dL — ABNORMAL HIGH (ref 65–99)

## 2017-09-22 LAB — CBC
HEMATOCRIT: 26.8 % — AB (ref 36.0–46.0)
HEMOGLOBIN: 8.4 g/dL — AB (ref 12.0–15.0)
MCH: 29.9 pg (ref 26.0–34.0)
MCHC: 31.3 g/dL (ref 30.0–36.0)
MCV: 95.4 fL (ref 78.0–100.0)
Platelets: 142 10*3/uL — ABNORMAL LOW (ref 150–400)
RBC: 2.81 MIL/uL — ABNORMAL LOW (ref 3.87–5.11)
RDW: 14.5 % (ref 11.5–15.5)
WBC: 5 10*3/uL (ref 4.0–10.5)

## 2017-09-22 SURGERY — CORONARY ARTERY BYPASS GRAFTING (CABG)
Anesthesia: General | Site: Chest

## 2017-09-22 MED ORDER — CLOPIDOGREL BISULFATE 75 MG PO TABS: 75.0000 mg | ORAL_TABLET | Freq: Every day | ORAL | 11 refills | Status: DC

## 2017-09-22 MED ORDER — METOPROLOL SUCCINATE ER 100 MG PO TB24: 100.0000 mg | ORAL_TABLET | Freq: Every day | ORAL | 11 refills | Status: DC

## 2017-09-22 MED ORDER — POLYETHYLENE GLYCOL 3350 17 GM/SCOOP PO POWD: ORAL | 3 refills | Status: DC

## 2017-09-22 MED ORDER — METOPROLOL SUCCINATE ER 100 MG PO TB24
100.0000 mg | ORAL_TABLET | Freq: Every day | ORAL | 11 refills | Status: DC
Start: 2017-09-22 — End: 2017-10-09

## 2017-09-22 MED ORDER — HEPARIN SOD (PORK) LOCK FLUSH 100 UNIT/ML IV SOLN
500.0000 [IU] | INTRAVENOUS | Status: AC | PRN
  Administered 2017-09-22: 500 [IU]

## 2017-09-22 NOTE — Telephone Encounter (Signed)
New message    Appt with Richardson Dopp on 10/30 at 3:15pm. TOC per Angie.

## 2017-09-22 NOTE — Telephone Encounter (Signed)
Per the pts husband the pt is still in the hospital and has not been released. Will callback later this week.

## 2017-09-22 NOTE — Discharge Summary (Signed)
Discharge Summary    Patient ID: [name],  MRN: [id number], DOB/AGE: 75-Apr-1942 75 y.o.  Admit date: 09/17/2017 Discharge date: 09/22/2017   Primary Care Provider: Marletta Lor Primary Cardiologist: Dr. Acie Fredrickson  Discharge Diagnoses    Principal Problem:   Acute on chronic combined systolic and diastolic CHF (congestive heart failure) ([place]) Active Problems:   NSTEMI (non-ST elevated myocardial infarction) ([place])   Unstable angina (H[facility])   Allergies No Known Allergies   History of Present Illness     Pt is a 76 year old woman with history of nonobstructive CAD, hypertension, hyperlipidemia, AML in remission, CKD, chronic anemia, and moderate COPD, presenting for evaluation of newly diagnosed low LVEF and recent NSTEMI. She was hospitalized last month due to worsening dyspnea on exertion and burning in her chest. At the time, echo revealed LVEF of 40-45% with apical hypokinesis. Troponin also rose up to 0.47. In the setting of acute on chronic kidney disease and 1/2 positive blood cultures, the decision was made to manage her medically rather than pursue cardiac catheterization at that time. She was seen in clinic for follow-up yesterday and noted continued significant exertional dyspnea (NYHA III). Creatinine has returned to baseline. She has therefore been referred for left and right heart catheterization.  Hospital Course     Consultants: TCTS  [name] presented for elective right and left heart catheterization. She had sequential 40% ostial and 50% distal LMCA stenoses, sequential proximal and mid LAD stenoses of 50-60%, 40% distal LCx lesion, and 80% proximal to mid RCA stenosis. FFR of the LAD is hemodynamically significant (FFR 0.72). She also had mildly elevated left and right heart filling pressures but normal cardiac output. She was admitted and TCTS was consulted for CABG revascularization, but she was ultimately turned down for surgery given her  comorbidities. She returned to the cath lab for stent placement to the RCA on 09/21/17. It is recommended that she remains on lifelong DAPT: ASA and plavix.  She was hydrated and discharge sCr was 1.40, near her baseline of 1.46.  She was discharged on ASA, plavix, lipitor, 100 mg toprol, imdur, and norvasc.  She is scheduled for follow up in two weeks. At that time, she can discuss utility of repeat cath for intervention on her LAD.   Patient seen and examined by Dr. Marlou Porch today and was stable for discharge. All follow up has been arranged.  _____________   Physical Exam   Discharge Vitals Blood pressure (!) 145/42, pulse 76, temperature 98.1 F (36.7 C), temperature source Oral, resp. rate 17, height 5\' 2"  (1.575 m), weight 235 lb 7.2 oz (106.8 kg), SpO2 99 %.  Filed Weights   09/20/17 0449 09/21/17 0527 09/22/17 0430  Weight: 229 lb 4.8 oz (104 kg) 229 lb 8 oz (104.1 kg) 235 lb 7.2 oz (106.8 kg)   Physical Exam  Constitutional: She is oriented to person, place, and time.  Obese, no acute distress  HENT:  Head: Normocephalic.  Neck: No JVD present.  Cardiovascular: Normal rate, regular rhythm and normal heart sounds.   No murmur heard. Pulmonary/Chest: Effort normal and breath sounds normal. No respiratory distress. She has no wheezes.  Abdominal: Soft. Bowel sounds are normal.  Musculoskeletal: She exhibits no edema.  Neurological: She is alert and oriented to person, place, and time.  Skin: Skin is warm and dry.  Psychiatric: She has a normal mood and affect.  ecchymosis noted near right radial site, no signs of infection  Labs &  Radiologic Studies    CBC  Recent Labs  09/21/17 0339 09/22/17 0555  WBC 5.5 5.0  NEUTROABS 3.1  --   HGB 9.9* 8.4*  HCT 31.7* 26.8*  MCV 94.9 95.4  PLT 175 665*   Basic Metabolic Panel  Recent Labs  09/21/17 0339 09/22/17 0555  NA 138 139  K 3.8 4.0  CL 103 106  CO2 28 27  GLUCOSE 135* 97  BUN 19 16  CREATININE 1.52*  1.40*  CALCIUM 8.7* 8.2*   Liver Function Tests No results for input(s): AST, ALT, ALKPHOS, BILITOT, PROT, ALBUMIN in the last 72 hours. No results for input(s): LIPASE, AMYLASE in the last 72 hours. Cardiac Enzymes No results for input(s): CKTOTAL, CKMB, CKMBINDEX, TROPONINI in the last 72 hours. BNP Invalid input(s): POCBNP D-Dimer No results for input(s): DDIMER in the last 72 hours. Hemoglobin A1C  Recent Labs  09/20/17 0306  HGBA1C 7.7*   Fasting Lipid Panel  Recent Labs  09/20/17 0306  CHOL 117  HDL 28*  LDLCALC 44  TRIG 224*  CHOLHDL 4.2   Thyroid Function Tests No results for input(s): TSH, T4TOTAL, T3FREE, THYROIDAB in the last 72 hours.  Invalid input(s): FREET3 _____________  Dg Chest 2 View  Result Date: 08/24/2017 CLINICAL DATA:  Shortness of breath EXAM: CHEST  2 VIEW COMPARISON:  03/27/2017 FINDINGS: Mild cardiac enlargement. No pulmonary vascular congestion or edema. Mild blunting of costophrenic angles suggesting small effusions. No pneumothorax. Mediastinal contours appear intact. Calcification of the aorta. Power port type central venous catheter on the right with tip over the low SVC region. Degenerative changes in the shoulders. IMPRESSION: Cardiac enlargement. Blunting of the costophrenic angles suggesting small effusions. No edema or consolidation. Electronically Signed   By: Lucienne Capers M.D.   On: 08/24/2017 01:40     Diagnostic Studies/Procedures    Left Heart Cath 09/21/17:  A STENT SIERRA 2.50 X 23 MM drug eluting stent was successfully placed.  Prox RCA to Mid RCA lesion, 90 %stenosed.  Post intervention, there is a 0% residual stenosis.   Difficult but successful PCI to the mid RCA with ultimate insertion of a 2.523 mm Xience Sierra DES stent postdilated to 2.8 mm with the 90% stenosis being reduced to 0%.  RECOMMENDATION: The patient should continue with DAPT, probably indefinitely with her concomitant CAD since she was turned  down for CABG revascularization.  Plan to hydrate the patient.  Increased medical therapy.  Consider staged PCI to the LAD since FFR was positive later this week or possibly in several weeks to allow for stabilization of renal function in this patient with renal insufficiency who already has undergone cath on 10/12 and PCI today vs medical therapy unless increase in symptoms.   Diagnostic Diagram       Post-Intervention Diagram           Right and Left Heart Cath 09/17/17: Conclusions: 1. Significant multivessel coronary artery disease, including sequential 40% ostial and 50% distal LMCA stenoses, sequential proximal and mid LAD stenoses of 50-60%, 40% distal LCx lesion, and 80% proximal to mid RCA stenosis. FFR of the LAD is hemodynamically significant (FFR 0.72). 2. Mildly elevated left and right heart filling pressures. 3. Normal Fick cardiac output  Recommendations: 1. Admit for medical optimization and cardiac surgery consultation for bypass, given the patient's multivessel CAD, diabetes mellitus, and reduced LVEF. 2. Gentle hydration tonight. Restart diuresis tomorrow based on renal function. 3. Aggressive secondary prevention.  Diagnostic Diagram  Echocardiogram 08/24/17: Study Conclusions - Left ventricle: The cavity size was normal. There was moderate   concentric hypertrophy. Systolic function was mildly to   moderately reduced. The estimated ejection fraction was in the   range of 40% to 45%. Hypokinesis of all mid-apical segments.   Apical akinesis. - Aortic valve: Transvalvular velocity was within the normal range.   There was no stenosis. There was no regurgitation. - Mitral valve: Mild thickening and calcification. Transvalvular   velocity was within the normal range. There was no evidence for   stenosis. There was trivial regurgitation. - Right ventricle: The cavity size was normal. Wall thickness was   normal. - Tricuspid valve: There was trivial  regurgitation. - Pulmonary arteries: Systolic pressure was moderately increased.   PA peak pressure: 51 mm Hg (S).  Disposition   Pt is being discharged home today in good condition.  Follow-up Plans & Appointments    Follow-up Information    Liliane Shi, PA-C Follow up on 10/06/2017.   Specialties:  Cardiology, Physician Assistant Why:  3:15 pm for hospital follow up and TOC Contact information: [id number] [id number] [phone]          Discharge Instructions    Diet - low sodium heart healthy    Complete by:  As directed    Discharge instructions    Complete by:  As directed    No driving for 2 days. No lifting over 5 lbs for 1 week. No sexual activity for 1 week. Keep procedure site clean & dry. If you notice increased pain, swelling, bleeding or pus, call/return!  You may shower, but no soaking baths/hot tubs/pools for 1 week.   Increase activity slowly    Complete by:  As directed       Discharge Medications   Current Discharge Medication List    START taking these medications   Details  clopidogrel (PLAVIX) 75 MG tablet Take 1 tablet (75 mg total) by mouth daily with breakfast. Qty: 30 tablet, Refills: 11    metoprolol succinate (TOPROL XL) 100 MG 24 hr tablet Take 1 tablet (100 mg total) by mouth daily. Take with or immediately following a meal. Qty: 30 tablet, Refills: 11      CONTINUE these medications which have NOT CHANGED   Details  amLODipine (NORVASC) 5 MG tablet Take 1 tablet (5 mg total) by mouth daily. Qty: 90 tablet, Refills: 1    aspirin 81 MG chewable tablet Chew 1 tablet (81 mg total) by mouth daily. Qty: 30 tablet, Refills: 1    budesonide-formoterol (SYMBICORT) 160-4.5 MCG/ACT inhaler Inhale 2 puffs into the lungs 2 (two) times daily. Qty: 1 Inhaler, Refills: 5    furosemide (LASIX) 40 MG tablet Take 1 tablet (40 mg total) by mouth daily. Qty: 90 tablet, Refills: 1    HYDROcodone-acetaminophen  (NORCO/VICODIN) 5-325 MG tablet Take 1 tablet by mouth every 6 (six) hours as needed for moderate pain. Qty: 120 tablet, Refills: 0    Insulin Glargine (TOUJEO SOLOSTAR) 300 UNIT/ML SOPN Inject 24 Units into the skin at bedtime. Qty: 1 pen, Refills: 6    insulin lispro (HUMALOG) 100 UNIT/ML KiwkPen Blood sugar less than 70- no insulin Blood sugar 71-150- 6 units Blood sugar 151- 200- 8 units Blood sugar over 200-  10 units Qty: 15 mL, Refills: 11    isosorbide mononitrate (IMDUR) 30 MG 24 hr tablet Take 1 tablet (30 mg total) by mouth daily. Qty: 30 tablet, Refills:  1    LORazepam (ATIVAN) 0.5 MG tablet Take 1 tablet (0.5 mg total) by mouth every 6 (six) hours as needed for anxiety. Qty: 90 tablet, Refills: 5    magnesium oxide (MAG-OX) 400 (241.3 Mg) MG tablet Take 2 tablets (800 mg total) by mouth 2 (two) times daily. Qty: 360 tablet, Refills: 3    nitroGLYCERIN (NITROSTAT) 0.4 MG SL tablet Place 1 tablet (0.4 mg total) under the tongue every 5 (five) minutes as needed for chest pain. Qty: 90 tablet, Refills: 1    promethazine (PHENERGAN) 12.5 MG tablet Take 2 tablets (25 mg total) by mouth every 6 (six) hours as needed for nausea. Qty: 12 tablet, Refills: 0    simvastatin (ZOCOR) 20 MG tablet Take 1 tablet (20 mg total) by mouth every evening. Qty: 90 tablet, Refills: 2      STOP taking these medications     metoprolol tartrate (LOPRESSOR) 50 MG tablet          Outstanding Labs/Studies   Discuss staged LAD intervention at follow up  Duration of Discharge Encounter   Greater than 30 minutes including physician time.  Signed, Tami Lin Duke PA-C 09/22/2017, 9:37 AM Personally seen and examined. Agree with above.  76 year old with CAD post RCA stent (challenging) with remaining LAD dz. Not felt to be a CABG candidate.    - DAPT  - consider LAD PCI as elective PCI (FFR was abnormal) in the future or continue with medical mgt if no anginal symptoms develop.    - Continue to encourage weight loss.   - Hg 8.4 dilutional. Aggressive IVF post cath. CKD 3. Radial site normal, RRR, obese, CTAB, alert.   OK for DC.  Return to work on Monday (next week) Requested Miralax.   Close follow up in clinic.   Candee Furbish, MD   Candee Furbish, MD

## 2017-09-22 NOTE — Progress Notes (Signed)
CARDIAC REHAB PHASE I   PRE:  Rate/Rhythm: 80 SR  BP:  Supine:   Sitting: 167/50  Standing:    SaO2: 95%RA  MODE:  Ambulation: 100 ft   POST:  Rate/Rhythm: 99 SR  BP:  Supine:   Sitting: 179/51  Standing:    SaO2: 96%RA 0905-1019 Pt only able to walk 100 ft independently with steady gait. Her back started to hurt badly with short distance. Pt only able to walk short distances normally. She had received pain med. Her pain intensified her SOB. Tried to get pt to do purse-lip breathing. Sats good on RA but pt with visible DOE. To recliner. Gave pt CHF booklet and encouraged daily weights and low sodium diet. Gave diabetic diet and discussed carb counting. Discussed importance of plavix with stent. Reviewed NTG use and importance of notifying MD if she has recurrent CP.  Discussed CRP 2 and will refer to [place] letting them know that pt for staged procedure. Pt stated she would not be able to do program with back issues. Did not give ex ed due to poor tolerance of ex with back issues.   Graylon Good, RN BSN  09/22/2017 10:10 AM

## 2017-09-23 ENCOUNTER — Observation Stay (HOSPITAL_COMMUNITY)
Admission: EM | Admit: 2017-09-23 | Discharge: 2017-09-24 | Disposition: A | Discharge status: Home / Self Care | Payer: Medicare HMO | Attending: Cardiology | Admitting: Cardiology

## 2017-09-23 ENCOUNTER — Encounter (HOSPITAL_COMMUNITY): Payer: Self-pay | Admitting: Emergency Medicine

## 2017-09-23 ENCOUNTER — Emergency Department (HOSPITAL_COMMUNITY): Payer: Medicare HMO

## 2017-09-23 ENCOUNTER — Other Ambulatory Visit: Payer: Self-pay | Admitting: *Deleted

## 2017-09-23 DIAGNOSIS — R079 Chest pain, unspecified: Secondary | ICD-10-CM | POA: Diagnosis present

## 2017-09-23 DIAGNOSIS — I130 Hypertensive heart and chronic kidney disease with heart failure and stage 1 through stage 4 chronic kidney disease, or unspecified chronic kidney disease: Secondary | ICD-10-CM | POA: Insufficient documentation

## 2017-09-23 DIAGNOSIS — I200 Unstable angina: Secondary | ICD-10-CM | POA: Diagnosis present

## 2017-09-23 DIAGNOSIS — Z87891 Personal history of nicotine dependence: Secondary | ICD-10-CM | POA: Diagnosis not present

## 2017-09-23 DIAGNOSIS — Z8673 Personal history of transient ischemic attack (TIA), and cerebral infarction without residual deficits: Secondary | ICD-10-CM | POA: Insufficient documentation

## 2017-09-23 DIAGNOSIS — I25110 Atherosclerotic heart disease of native coronary artery with unstable angina pectoris: Principal | ICD-10-CM | POA: Insufficient documentation

## 2017-09-23 DIAGNOSIS — Z794 Long term (current) use of insulin: Secondary | ICD-10-CM

## 2017-09-23 DIAGNOSIS — I252 Old myocardial infarction: Secondary | ICD-10-CM | POA: Diagnosis not present

## 2017-09-23 DIAGNOSIS — E1122 Type 2 diabetes mellitus with diabetic chronic kidney disease: Secondary | ICD-10-CM | POA: Diagnosis not present

## 2017-09-23 DIAGNOSIS — E785 Hyperlipidemia, unspecified: Secondary | ICD-10-CM | POA: Diagnosis not present

## 2017-09-23 DIAGNOSIS — Z79899 Other long term (current) drug therapy: Secondary | ICD-10-CM | POA: Diagnosis not present

## 2017-09-23 DIAGNOSIS — E0821 Diabetes mellitus due to underlying condition with diabetic nephropathy: Secondary | ICD-10-CM

## 2017-09-23 DIAGNOSIS — Z955 Presence of coronary angioplasty implant and graft: Secondary | ICD-10-CM | POA: Diagnosis not present

## 2017-09-23 DIAGNOSIS — Z7902 Long term (current) use of antithrombotics/antiplatelets: Secondary | ICD-10-CM | POA: Insufficient documentation

## 2017-09-23 DIAGNOSIS — K7460 Unspecified cirrhosis of liver: Secondary | ICD-10-CM | POA: Insufficient documentation

## 2017-09-23 DIAGNOSIS — I2510 Atherosclerotic heart disease of native coronary artery without angina pectoris: Secondary | ICD-10-CM | POA: Diagnosis not present

## 2017-09-23 DIAGNOSIS — Z6841 Body Mass Index (BMI) 40.0 and over, adult: Secondary | ICD-10-CM | POA: Insufficient documentation

## 2017-09-23 DIAGNOSIS — I5042 Chronic combined systolic (congestive) and diastolic (congestive) heart failure: Secondary | ICD-10-CM | POA: Insufficient documentation

## 2017-09-23 DIAGNOSIS — N184 Chronic kidney disease, stage 4 (severe): Secondary | ICD-10-CM | POA: Diagnosis not present

## 2017-09-23 DIAGNOSIS — J449 Chronic obstructive pulmonary disease, unspecified: Secondary | ICD-10-CM | POA: Insufficient documentation

## 2017-09-23 DIAGNOSIS — C9201 Acute myeloblastic leukemia, in remission: Secondary | ICD-10-CM | POA: Diagnosis not present

## 2017-09-23 DIAGNOSIS — Z8542 Personal history of malignant neoplasm of other parts of uterus: Secondary | ICD-10-CM | POA: Diagnosis not present

## 2017-09-23 DIAGNOSIS — I429 Cardiomyopathy, unspecified: Secondary | ICD-10-CM | POA: Insufficient documentation

## 2017-09-23 DIAGNOSIS — I4891 Unspecified atrial fibrillation: Secondary | ICD-10-CM | POA: Diagnosis not present

## 2017-09-23 DIAGNOSIS — I209 Angina pectoris, unspecified: Secondary | ICD-10-CM | POA: Diagnosis not present

## 2017-09-23 LAB — COMPREHENSIVE METABOLIC PANEL
ALT: 20 U/L (ref 14–54)
AST: 25 U/L (ref 15–41)
Albumin: 3.1 g/dL — ABNORMAL LOW (ref 3.5–5.0)
Alkaline Phosphatase: 79 U/L (ref 38–126)
Anion gap: 8 (ref 5–15)
BILIRUBIN TOTAL: 0.9 mg/dL (ref 0.3–1.2)
BUN: 15 mg/dL (ref 6–20)
CALCIUM: 8.5 mg/dL — AB (ref 8.9–10.3)
CO2: 24 mmol/L (ref 22–32)
CREATININE: 1.44 mg/dL — AB (ref 0.44–1.00)
Chloride: 106 mmol/L (ref 101–111)
GFR calc Af Amer: 40 mL/min — ABNORMAL LOW (ref >60)
GFR, EST NON AFRICAN AMERICAN: 35 mL/min — AB (ref >60)
Glucose, Bld: 288 mg/dL — ABNORMAL HIGH (ref 65–99)
Potassium: 4.5 mmol/L (ref 3.5–5.1)
Sodium: 138 mmol/L (ref 135–145)
Total Protein: 5.3 g/dL — ABNORMAL LOW (ref 6.5–8.1)

## 2017-09-23 LAB — I-STAT TROPONIN, ED: Troponin i, poc: 0.16 ng/mL (ref 0.00–0.08)

## 2017-09-23 LAB — CBC WITH DIFFERENTIAL/PLATELET
BASOS PCT: 0 %
Basophils Absolute: 0 10*3/uL (ref 0.0–0.1)
EOS ABS: 0.1 10*3/uL (ref 0.0–0.7)
EOS PCT: 3 %
HCT: 29.9 % — ABNORMAL LOW (ref 36.0–46.0)
Hemoglobin: 9.5 g/dL — ABNORMAL LOW (ref 12.0–15.0)
LYMPHS ABS: 1.1 10*3/uL (ref 0.7–4.0)
Lymphocytes Relative: 25 %
MCH: 30.5 pg (ref 26.0–34.0)
MCHC: 31.8 g/dL (ref 30.0–36.0)
MCV: 96.1 fL (ref 78.0–100.0)
Monocytes Absolute: 0.3 10*3/uL (ref 0.1–1.0)
Monocytes Relative: 6 %
Neutro Abs: 2.8 10*3/uL (ref 1.7–7.7)
Neutrophils Relative %: 66 %
PLATELETS: 127 10*3/uL — AB (ref 150–400)
RBC: 3.11 MIL/uL — AB (ref 3.87–5.11)
RDW: 15.1 % (ref 11.5–15.5)
WBC: 4.3 10*3/uL (ref 4.0–10.5)

## 2017-09-23 LAB — TROPONIN I: TROPONIN I: 0.17 ng/mL — AB (ref <0.03)

## 2017-09-23 LAB — GLUCOSE, CAPILLARY: Glucose-Capillary: 330 mg/dL — ABNORMAL HIGH (ref 65–99)

## 2017-09-23 MED ORDER — INSULIN ASPART 100 UNIT/ML ~~LOC~~ SOLN
0.0000 [IU] | Freq: Every day | SUBCUTANEOUS | Status: DC
  Administered 2017-09-23: 4 [IU] via SUBCUTANEOUS

## 2017-09-23 MED ORDER — METOPROLOL SUCCINATE ER 100 MG PO TB24
100.0000 mg | ORAL_TABLET | Freq: Every day | ORAL | Status: DC
  Administered 2017-09-24: 100 mg via ORAL
  Filled 2017-09-23 (×2): qty 1

## 2017-09-23 MED ORDER — SODIUM CHLORIDE 0.9 % WEIGHT BASED INFUSION
1.0000 mL/kg/h | INTRAVENOUS | Status: DC
  Administered 2017-09-24: 1 mL/kg/h via INTRAVENOUS

## 2017-09-23 MED ORDER — ASPIRIN 81 MG PO CHEW: 81.0000 mg | CHEWABLE_TABLET | Freq: Every day | ORAL | Status: DC

## 2017-09-23 MED ORDER — CLOPIDOGREL BISULFATE 75 MG PO TABS
75.0000 mg | ORAL_TABLET | Freq: Every day | ORAL | Status: DC
  Administered 2017-09-23 – 2017-09-24 (×2): 75 mg via ORAL
  Filled 2017-09-23 (×2): qty 1

## 2017-09-23 MED ORDER — ASPIRIN 81 MG PO CHEW
324.0000 mg | CHEWABLE_TABLET | ORAL | Status: AC
  Administered 2017-09-23: 324 mg via ORAL
  Filled 2017-09-23: qty 4

## 2017-09-23 MED ORDER — PROMETHAZINE HCL 25 MG PO TABS: 25.0000 mg | ORAL_TABLET | Freq: Four times a day (QID) | ORAL | Status: DC | PRN

## 2017-09-23 MED ORDER — LORAZEPAM 0.5 MG PO TABS
0.5000 mg | ORAL_TABLET | Freq: Four times a day (QID) | ORAL | Status: DC | PRN
  Administered 2017-09-23: 0.5 mg via ORAL
  Filled 2017-09-23: qty 1

## 2017-09-23 MED ORDER — NITROGLYCERIN 0.4 MG SL SUBL: 0.4000 mg | SUBLINGUAL_TABLET | SUBLINGUAL | Status: DC | PRN

## 2017-09-23 MED ORDER — SODIUM CHLORIDE 0.9 % IV SOLN
INTRAVENOUS | Status: AC
  Administered 2017-09-23: 50 mL/h via INTRAVENOUS

## 2017-09-23 MED ORDER — ONDANSETRON HCL 4 MG/2ML IJ SOLN: 4.0000 mg | Freq: Four times a day (QID) | INTRAMUSCULAR | Status: DC | PRN

## 2017-09-23 MED ORDER — SODIUM CHLORIDE 0.9 % IV SOLN: 250.0000 mL | INTRAVENOUS | Status: DC | PRN

## 2017-09-23 MED ORDER — ISOSORBIDE MONONITRATE ER 30 MG PO TB24
30.0000 mg | ORAL_TABLET | Freq: Every day | ORAL | Status: DC
  Administered 2017-09-24: 30 mg via ORAL
  Filled 2017-09-23 (×2): qty 1

## 2017-09-23 MED ORDER — INSULIN ASPART 100 UNIT/ML ~~LOC~~ SOLN
0.0000 [IU] | Freq: Three times a day (TID) | SUBCUTANEOUS | Status: DC
  Administered 2017-09-24: 3 [IU] via SUBCUTANEOUS
  Administered 2017-09-24: 5 [IU] via SUBCUTANEOUS

## 2017-09-23 MED ORDER — SODIUM CHLORIDE 0.9 % WEIGHT BASED INFUSION
3.0000 mL/kg/h | INTRAVENOUS | Status: AC
  Administered 2017-09-24: 3 mL/kg/h via INTRAVENOUS

## 2017-09-23 MED ORDER — SODIUM CHLORIDE 0.9% FLUSH
3.0000 mL | Freq: Two times a day (BID) | INTRAVENOUS | Status: DC
  Administered 2017-09-23: 3 mL via INTRAVENOUS

## 2017-09-23 MED ORDER — MAGNESIUM OXIDE 400 (241.3 MG) MG PO TABS
800.0000 mg | ORAL_TABLET | Freq: Two times a day (BID) | ORAL | Status: DC
  Administered 2017-09-23 – 2017-09-24 (×2): 800 mg via ORAL
  Filled 2017-09-23 (×3): qty 2

## 2017-09-23 MED ORDER — HYDROCODONE-ACETAMINOPHEN 5-325 MG PO TABS
1.0000 | ORAL_TABLET | Freq: Four times a day (QID) | ORAL | Status: DC | PRN
  Administered 2017-09-23 – 2017-09-24 (×2): 1 via ORAL
  Filled 2017-09-23 (×2): qty 1

## 2017-09-23 MED ORDER — HEPARIN SODIUM (PORCINE) 5000 UNIT/ML IJ SOLN
5000.0000 [IU] | Freq: Three times a day (TID) | INTRAMUSCULAR | Status: DC
  Administered 2017-09-23 – 2017-09-24 (×2): 5000 [IU] via SUBCUTANEOUS
  Filled 2017-09-23 (×2): qty 1

## 2017-09-23 MED ORDER — SIMVASTATIN 20 MG PO TABS
20.0000 mg | ORAL_TABLET | Freq: Every evening | ORAL | Status: DC
  Administered 2017-09-23: 20 mg via ORAL
  Filled 2017-09-23: qty 1

## 2017-09-23 MED ORDER — POLYETHYLENE GLYCOL 3350 17 GM/SCOOP PO POWD: 1.0000 | Freq: Two times a day (BID) | ORAL | Status: DC | PRN

## 2017-09-23 MED ORDER — SODIUM CHLORIDE 0.9% FLUSH: 3.0000 mL | INTRAVENOUS | Status: DC | PRN

## 2017-09-23 MED ORDER — AMLODIPINE BESYLATE 5 MG PO TABS
5.0000 mg | ORAL_TABLET | Freq: Every day | ORAL | Status: DC
  Administered 2017-09-24: 5 mg via ORAL
  Filled 2017-09-23 (×2): qty 1

## 2017-09-23 MED ORDER — ACETAMINOPHEN 325 MG PO TABS: 650.0000 mg | ORAL_TABLET | ORAL | Status: DC | PRN

## 2017-09-23 MED ORDER — SODIUM CHLORIDE 0.9% FLUSH
10.0000 mL | INTRAVENOUS | Status: DC | PRN
  Administered 2017-09-24 (×2): 10 mL
  Filled 2017-09-23 (×2): qty 40

## 2017-09-23 MED ORDER — ASPIRIN 81 MG PO CHEW
81.0000 mg | CHEWABLE_TABLET | ORAL | Status: AC
  Administered 2017-09-24: 81 mg via ORAL
  Filled 2017-09-23: qty 1

## 2017-09-23 MED ORDER — MOMETASONE FURO-FORMOTEROL FUM 200-5 MCG/ACT IN AERO
2.0000 | INHALATION_SPRAY | Freq: Two times a day (BID) | RESPIRATORY_TRACT | Status: DC
  Administered 2017-09-23 – 2017-09-24 (×2): 2 via RESPIRATORY_TRACT
  Filled 2017-09-23: qty 8.8

## 2017-09-23 MED ORDER — ASPIRIN EC 81 MG PO TBEC
81.0000 mg | DELAYED_RELEASE_TABLET | Freq: Every day | ORAL | Status: DC
  Filled 2017-09-23: qty 1

## 2017-09-23 MED ORDER — POLYETHYLENE GLYCOL 3350 17 G PO PACK: 17.0000 g | PACK | Freq: Three times a day (TID) | ORAL | Status: DC | PRN

## 2017-09-23 MED ORDER — ASPIRIN 300 MG RE SUPP
300.0000 mg | RECTAL | Status: AC
  Filled 2017-09-23: qty 1

## 2017-09-23 NOTE — Patient Outreach (Signed)
Patient triggered RED on [facility] Heart Failure dashboard, notification sent to:  Sherrin Daisy, RN

## 2017-09-23 NOTE — Telephone Encounter (Signed)
Pt c/o of Chest Pain: STAT if CP now or developed within 24 hours  1. Are you having CP right now? no  2. Are you experiencing any other symptoms (ex. SOB, nausea, vomiting, sweating)? wheezing  3. How long have you been experiencing CP? Last night 4. Is your CP continuous or coming and going? both  5. Have you taken Nitroglycerin? Yes  ?

## 2017-09-23 NOTE — H&P (Signed)
Cardiology History and Physical   Patient ID: [name]; [id number]; May 08, 1941   Admit date: 09/23/2017 Date of Consult: 09/23/2017  Primary Care Provider: Marletta Lor, MD Primary Cardiologist: Dr. Acie Fredrickson Primary Electrophysiologist:     Patient Profile:   [name] is a 76 y.o. female with a hx of CAD s/p DES to RCA 09/21/17, HTN, HLD, COPD, AML in remission, and CKD stage 2 who is being seen today for the evaluation of recurrent chest pain.  History of Present Illness:   [name] is a 76 year old woman with history of nonobstructive CAD, hypertension, hyperlipidemia, AML in remission, CKD, chronic anemia, and moderate COPD. She was hospitalized last month due to worsening dyspnea on exertion and burning in her chest. At the time, echo revealed LVEF of 40-45% with apical hypokinesis. Troponin also rose up to 0.47. In the setting of acute on chronic kidney disease and 1/2 positive blood cultures, the decision was made to manage her medically rather than pursue cardiac catheterization at that time. She was seen in clinic forfollow-up 09/16/17 and noted continued significant exertional dyspnea (NYHA III). Creatinine has returned to baseline. She was scheduled for elective outpatient left and right heart catheterization.   On heart caht, she had sequential 40% ostial and 50% distal LMCA stenoses, sequential proximal and mid LAD stenoses of 50-60%, 40% distal LCx lesion, and 80% proximal to mid RCA stenosis. FFR of the LAD is hemodynamically significant (FFR 0.72). She also had mildly elevated left and right heart filling pressures but normal cardiac output. She was admitted and TCTS was consulted for CABG revascularization, but she was ultimately turned down for surgery given her comorbidities. She returned to the cath lab for stent placement to the RCA on 09/21/17. It was recommended that she continue on lifelong DAPT with ASA and plavix.   She returned to [facility] today  09/23/17 with 2 episodes of chest burning. This is described as the same sensation she had prior to her recent heart cath.she took one sublingual nitroglycerin with relief of her chest burning. She denies shortness of breath, palpitations, dizziness, lightheadedness, syncope, lower extremity edema. She was able to go to bed and rest. However; she woke up with the chest burning at approximately 5 AM this morning. The pain lasted about 5 minutes before subsiding. She waited until 8 AM this morning to call the [employer] for advice. She was advised to return to [facility] ED. On my interview, she is not currently having chest pain. She has had no further chest pain since 5 AM this morning. She did not take a nitroglycerin SL this morning. She has received no medications in the ER.    Past Medical History:  Diagnosis Date  . acute myeloblastic leukemia (AML) dx'd 06/2014  . Acute pancreatitis   . Arthritis    "back" (09/17/2017)  . Atrial flutter ([place])   . Cardiomyopathy ([place])   . Chronic kidney disease, stage II (mild)   . Chronic lower back pain   . Colon polyps 04/29/2010   TUBULAR ADENOMA AND A SERRATED ADENOMA  . COPD (chronic obstructive pulmonary disease) ([place])   . CORONARY ARTERY DISEASE 12/24/2007  . DIABETES MELLITUS, TYPE II 07/13/2007  . History of blood transfusion 2015   "related to leukemia"  . History of kidney stones 12/24/2007  . HYPERLIPIDEMIA 12/24/2007  . HYPERTENSION 07/13/2007  . NSTEMI (non-ST elevated myocardial infarction) ([place]) 09/17/2017  . Unspecified disease of pancreas   . Uterine cancer ([place])  Past Surgical History:  Procedure Laterality Date  . ABDOMINAL HYSTERECTOMY     "still have my ovaries"  . CARDIAC CATHETERIZATION  2002  . CARDIAC CATHETERIZATION  09/17/2017  . CATARACT EXTRACTION W/ INTRAOCULAR LENS  IMPLANT, BILATERAL Bilateral   . COLONOSCOPY W/ BIOPSIES AND POLYPECTOMY  2011  . CORONARY STENT INTERVENTION N/A 09/21/2017   Procedure:  CORONARY STENT INTERVENTION;  Surgeon: Troy Sine, MD;  Location: [place] [facility];  Service: Cardiovascular;  Laterality: N/A;  . DILATION AND CURETTAGE OF UTERUS    . EXCISIONAL HEMORRHOIDECTOMY  X 2  . GANGLION CYST EXCISION Left X 3  . INTRAVASCULAR PRESSURE WIRE/FFR STUDY N/A 09/17/2017   Procedure: INTRAVASCULAR PRESSURE WIRE/FFR STUDY;  Surgeon: Nelva Bush, MD;  Location: [place] [facility];  Service: Cardiovascular;  Laterality: N/A;  . NASAL SINUS SURGERY    . PORTA CATH INSERTION Right 2013  . RIGHT/LEFT HEART CATH AND CORONARY ANGIOGRAPHY N/A 09/17/2017   Procedure: RIGHT/LEFT HEART CATH AND CORONARY ANGIOGRAPHY;  Surgeon: Nelva Bush, MD;  Location: [place] [facility];  Service: Cardiovascular;  Laterality: N/A;  . TUBAL LIGATION       Home Medications:  Prior to Admission medications   Medication Sig Start Date End Date Taking? Authorizing Provider  amLODipine (NORVASC) 5 MG tablet Take 1 tablet (5 mg total) by mouth daily. 02/10/17  Yes Marletta Lor, MD  aspirin 81 MG chewable tablet Chew 1 tablet (81 mg total) by mouth daily. 08/29/17  Yes Barton Dubois, MD  budesonide-formoterol [facility]) 160-4.5 MCG/ACT inhaler Inhale 2 puffs into the lungs 2 (two) times daily. Patient taking differently: Inhale 2 puffs into the lungs 2 (two) times daily as needed.  05/08/17 09/23/17 Yes Parrett, Fonnie Mu, NP  clopidogrel (PLAVIX) 75 MG tablet Take 1 tablet (75 mg total) by mouth daily with breakfast. 09/23/17  Yes Duke, Tami Lin, PA  furosemide (LASIX) 40 MG tablet Take 1 tablet (40 mg total) by mouth daily. 02/10/17  Yes Marletta Lor, MD  HYDROcodone-acetaminophen (NORCO/VICODIN) 5-325 MG tablet Take 1 tablet by mouth every 6 (six) hours as needed for moderate pain. 09/08/17  Yes Marletta Lor, MD  Insulin Glargine (TOUJEO SOLOSTAR) 300 UNIT/ML SOPN Inject 24 Units into the skin at bedtime. 09/08/17  Yes Marletta Lor, MD  insulin lispro  (HUMALOG) 100 UNIT/ML KiwkPen Blood sugar less than 70- no insulin Blood sugar 71-150- 6 units Blood sugar 151- 200- 8 units Blood sugar over 200-  10 units Patient taking differently: Inject into the skin 3 (three) times daily. Blood sugar less than 70- no insulin Blood sugar 71-150- 6 units Blood sugar 151- 200- 8 units Blood sugar over 200-  10 units 09/08/17  Yes Marletta Lor, MD  isosorbide mononitrate (IMDUR) 30 MG 24 hr tablet Take 1 tablet (30 mg total) by mouth daily. 08/29/17  Yes Barton Dubois, MD  LORazepam (ATIVAN) 0.5 MG tablet Take 1 tablet (0.5 mg total) by mouth every 6 (six) hours as needed for anxiety. 02/15/16  Yes Marletta Lor, MD  magnesium oxide (MAG-OX) 400 (241.3 Mg) MG tablet Take 2 tablets (800 mg total) by mouth 2 (two) times daily. 02/15/16  Yes Marletta Lor, MD  metoprolol succinate (TOPROL XL) 100 MG 24 hr tablet Take 1 tablet (100 mg total) by mouth daily. Take with or immediately following a meal. 09/22/17 09/22/18 Yes Duke, Tami Lin, PA  nitroGLYCERIN (NITROSTAT) 0.4 MG SL tablet Place 1 tablet (0.4 mg total) under  the tongue every 5 (five) minutes as needed for chest pain. 04/25/15  Yes Marletta Lor, MD  polyethylene glycol powder [facility]) powder Take 17 grams by mouth in 8 ounces of water one to two times daily for constipation. 09/22/17  Yes Duke, Tami Lin, PA  promethazine (PHENERGAN) 12.5 MG tablet Take 2 tablets (25 mg total) by mouth every 6 (six) hours as needed for nausea. 05/10/13  Yes [name], PA-C  simvastatin (ZOCOR) 20 MG tablet Take 1 tablet (20 mg total) by mouth every evening. 02/10/17  Yes Marletta Lor, MD    Inpatient Medications: Scheduled Meds:  Continuous Infusions:  PRN Meds: sodium chloride flush  Allergies:   No Known Allergies  Social History:   Social History   Social History  . Marital status: Married    Spouse name: N/A  . Number of children: N/A  . Years of  education: N/A   Occupational History  . works in a lab     makes glasses   Social History Main Topics  . Smoking status: Former Smoker    Packs/day: 1.00    Years: 20.00    Types: Cigarettes    Quit date: 12/08/2000  . Smokeless tobacco: Never Used  . Alcohol use No  . Drug use: No  . Sexual activity: No   Other Topics Concern  . 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 Cancer Father        throat ca  . COPD Father   . Cancer Sister        uterine ca and breast ca  . Cancer Brother        throat  . Congestive Heart Failure Mother   . Heart attack Maternal Grandmother   . Colon cancer Neg Hx      ROS:  Please see the history of present illness.  ROS  All other ROS reviewed and negative.     Physical Exam/Data:   Vitals:   09/23/17 0934 09/23/17 1230 09/23/17 1302  BP:  (!) 134/48 (!) 132/50  Pulse: 99 85 89  Resp: 16 (!) 24 (!) 21  Temp: 98 F (36.7 C)    TempSrc: Oral    SpO2: 97% 96% 94%    General:  Well nourished, well developed, in no acute distress HEENT: normal Neck: no JVD Vascular: No carotid bruits Cardiac:  normal S1, S2; RRR; no murmur  Lungs:  clear to auscultation bilaterally, no wheezing, rhonchi or rales  Abd: soft, nontender, no hepatomegaly  Ext: no edema Musculoskeletal:  No deformities, BUE and BLE strength normal and equal Skin: warm and dry  Neuro:  CNs 2-12 intact, no focal abnormalities noted Psych:  Normal affect   EKG:  The EKG was personally reviewed and demonstrates:  Sinus with sinus arrhythmia Telemetry:  Telemetry was personally reviewed and demonstrates:  Sinus   Relevant CV Studies:  Left Heart Cath 09/21/17:  A STENT SIERRA 2.50 X 23 MM drug eluting stent was successfully placed.  Prox RCA to Mid RCA lesion, 90 %stenosed.  Post intervention, there is a 0% residual stenosis.  Difficult  but successful PCI to the mid RCA with ultimate insertion of a 2.523 mm Xience Sierra DES stent postdilated to 2.8 mm with the 90% stenosis being reduced to 0%.  RECOMMENDATION: The patient should continue with DAPT,  probably indefinitely with her concomitant CAD since she was turned down for CABG revascularization. Plan to hydrate the patient. Increased medical therapy. Consider staged PCI to the LAD since FFR was positive later this week or possibly in several weeks to allow for stabilization of renal function in this patient with renal insufficiency who already has undergone cath on 10/12 and PCI today vs medical therapy unless increase in symptoms.   Diagnostic Diagram       Post-Intervention Diagram         Right and left heart cath 09/17/17: Conclusions: 1. Significant multivessel coronary artery disease, including sequential 40% ostial and 50% distal LMCA stenoses, sequential proximal and mid LAD stenoses of 50-60%, 40% distal LCx lesion, and 80% proximal to mid RCA stenosis. FFR of the LAD is hemodynamically significant (FFR 0.72). 2. Mildly elevated left and right heart filling pressures. 3. Normal Fick cardiac output  Recommendations: 1. Admit for medical optimization and cardiac surgery consultation for bypass, given the patient's multivessel CAD, diabetes mellitus, and reduced LVEF. 2. Gentle hydration tonight. Restart diuresis tomorrow based on renal function. 3. Aggressive secondary prevention.   Diagnostic Diagram            Laboratory Data:  Chemistry Recent Labs Lab 09/21/17 0339 09/22/17 0555 09/23/17 1230  NA 138 139 138  K 3.8 4.0 4.5  CL 103 106 106  CO2 28 27 24   GLUCOSE 135* 97 288*  BUN 19 16 15   CREATININE 1.52* 1.40* 1.44*  CALCIUM 8.7* 8.2* 8.5*  GFRNONAA 32* 36* 35*  GFRAA 38* 41* 40*  ANIONGAP 7 6 8      Recent Labs Lab 09/18/17 0335 09/23/17 1230  PROT 5.7* 5.3*  ALBUMIN 3.2* 3.1*  AST 20 25  ALT 14 20    ALKPHOS 83 79  BILITOT 0.8 0.9   Hematology Recent Labs Lab 09/21/17 0339 09/22/17 0555 09/23/17 1230  WBC 5.5 5.0 4.3  RBC 3.34* 2.81* 3.11*  HGB 9.9* 8.4* 9.5*  HCT 31.7* 26.8* 29.9*  MCV 94.9 95.4 96.1  MCH 29.6 29.9 30.5  MCHC 31.2 31.3 31.8  RDW 14.6 14.5 15.1  PLT 175 142* 127*   Cardiac EnzymesNo results for input(s): TROPONINI in the last 168 hours.  Recent Labs Lab 09/23/17 1242  TROPIPOC 0.16*    BNPNo results for input(s): BNP, PROBNP in the last 168 hours.  DDimer No results for input(s): DDIMER in the last 168 hours.  Radiology/Studies:  Dg Chest 2 View  Result Date: 09/23/2017 CLINICAL DATA:  Chest pain EXAM: CHEST  2 VIEW COMPARISON:  08/24/2017 FINDINGS: Porta catheter on the right with tip at the upper cavoatrial junction. Chronic cardiomegaly. EKG leads create artifact over the chest. There is no edema, consolidation, effusion, or pneumothorax. Stable mediastinal contours. IMPRESSION: 1. No evidence of active disease. 2. Chronic cardiomegaly. Electronically Signed   By: Monte Fantasia M.D.   On: 09/23/2017 11:42    Assessment and Plan:   1. Chest pain, CAD, s/p DES to RCA 09/21/17 and 50-60% stenosis - This patient has known CAD. Troponin initially drawn was 0.16. EKG looks similar to prior. She is not currently in chest pain. We'll plan to admit patient to cardiology service for repeat heart catheterization tomorrow with possible coronary stenting of her LAD. Differential includes in-stent restenosis versus LAD etiology - continue aspirin, Plavix - nitroglycerin PRN - will hold off on heparin, if second troponin has increased over initial, will start heparin drip   2. Hypertension - Continue Norvasc, Toprol  3. Hyperlipidemia - Continue statin   4. CKD stage II - sCr 1.44 (1.40) - hydrate with gentle IV fluids    For questions or updates, please contact [place] Please consult [email or web address] for contact info under  Cardiology/STEMI.   Signed, Tami Lin Duke, PA  09/23/2017 1:39 PM  Personally seen and examined. Agree with above.  76 year old with severe CAD, post PCI to RCA 3 days ago, with residual LAD FFR positive lesion here with angina, mildly elevated Troponin, likely related to recent PCI.  ECG shows no ST elevation in inferior leads. Hence, stent thrombosis not present. CP currently is resolved. However I am concerned that she may have another episode of pain in near future. Because of this, we will set her up for LAD PCI tomorrow. IVF will be administered.  CKD 3- creat has been 1.4.  Continue with goal directed therapy. If troponin increases, start heparin.   Exam: alert, MAE, RRR, obese, CTAB.  Candee Furbish, MD

## 2017-09-23 NOTE — Progress Notes (Signed)
Paged physician regarding pain medication for patient.  Awaiting call back.

## 2017-09-23 NOTE — ED Triage Notes (Signed)
Pt was just discharged yesterday from this hospital after having a cardiac stent put in on Monday. Pt states last night at 10pm she started having a burning in the top of her chest into her throat. Pt states the pain last until this am and then went away. Pt has no discomfort at this time.

## 2017-09-23 NOTE — Patient Outreach (Signed)
[place] [facility]) Care Management  09/23/2017  LEGACY CARRENDER 04-Jun-1941 [id number]  EMMI-Heart Failure -Red Alert referral -Day#22, 10/16;  Reason: weight-235 lbs. / It is noted that there are no answers to EMMI calls from 10/4-10/16. Per history patient had planned hospital admission from 10/11 thru 10/16 where elective left & right catheterizations were done. Stent placement completed on 10/15; Pt not a candidate for bypass due to co-morbidities.  "TOC" being done by primary care office who will refer to [facility] if needed.   Today 10/17 per notes patient is in ED.  Patient had been in contact with MD office.    Plan: Will follow up.      Sherrin Daisy, RN BSN [place] Management Coordinator [facility] Care Management  [phone]

## 2017-09-23 NOTE — ED Provider Notes (Signed)
[facility] EMERGENCY DEPARTMENT Provider Note   CSN: [id number] Arrival date & time: 09/23/17  4098     History   Chief Complaint Chief Complaint  Patient presents with  . Chest Pain    HPI [name] is a 76 y.o. female.  HPI   [name] is a 76 y.o. female, with a history of CAD, cardiac cath, atrial flutter, pancreatitis, CKD stage II, AML, DM, HTN, and a-fib, presenting to the ED with chest pain beginning last night around 10PM. Three separate episodes. Took one nitro with first episode, which helped. Did not take any other nitro. Feels similar to the chest pain she had when recent blockages were found. Last pain episode at 5AM this morning, but no recurrence since then. Accompanied by shortness of breath. Pain was burning, superior sternum, nonradiating, 4-5/10. Each episode lasted for 5 minutes or less and arose while patient was at rest. Nothing made it worse or better. He states he does not feel like acid reflux. Stent placed on 10/15, discharged yesterday. Was recommended for possible CABG, but ultimately determined not to be a candidate. Patient called cardiology office this morning. Case was discussed with Dr. Irish Lack who recommended patient come to ED.  Patient denies increased shortness of breath above baseline, current chest pain, abdominal pain, cough, N/V/D, fever/chills, dizziness, diaphoresis, or any other complaints.  Cardiologist: Nahser  Past Medical History:  Diagnosis Date  . acute myeloblastic leukemia (AML) dx'd 06/2014  . Acute pancreatitis   . Arthritis    "back" (09/17/2017)  . Atrial flutter ([place])   . Cardiomyopathy ([place])   . Chronic kidney disease, stage II (mild)   . Chronic lower back pain   . Colon polyps 04/29/2010   TUBULAR ADENOMA AND A SERRATED ADENOMA  . COPD (chronic obstructive pulmonary disease) ([place])   . CORONARY ARTERY DISEASE 12/24/2007  . DIABETES MELLITUS, TYPE II 07/13/2007  . History of blood  transfusion 2015   "related to leukemia"  . History of kidney stones 12/24/2007  . HYPERLIPIDEMIA 12/24/2007  . HYPERTENSION 07/13/2007  . NSTEMI (non-ST elevated myocardial infarction) ([place]) 09/17/2017  . Unspecified disease of pancreas   . Uterine cancer [facility])     Patient Active Problem List   Diagnosis Date Noted  . Unstable angina ([place]) 09/17/2017  . NSTEMI (non-ST elevated myocardial infarction) ([place])   . Acute on chronic combined systolic and diastolic CHF (congestive heart failure) ([place])   . COPD (chronic obstructive pulmonary disease) ([place]) 04/24/2017  . Vitamin B12 deficiency 10/29/2016  . Port catheter in place 05/30/2016  . Other pancytopenia ([place]) 08/09/2014  . Leukemia, acute ([place]) 06/08/2014  . TIA (transient ischemic attack) 05/24/2014  . CKD (chronic kidney disease) stage 4, GFR 15-29 ml/min (HCC) 05/24/2014  . AML (acute myeloblastic leukemia) ([place]) 05/24/2014  . Acute leukemia ([place]) 03/02/2014  . Atrial fibrillation with RVR ([place]) 02/24/2014  . Cervical spine pain 02/24/2014  . Alkaline phosphatase elevation 02/24/2014  . Back pain 02/13/2014  . PAF- flutter 02/04/2014  . Cirrhosis of liver without mention of alcohol 01/31/2014  . Pancreatitis 01/29/2014  . Lesion of pancreas 01/29/2014  . HIP PAIN, LEFT 01/26/2008  . Hyperlipidemia 12/24/2007  . CAD, non obstructive in '02 12/24/2007  . CHEST PAIN, ATYPICAL 12/24/2007  . NEPHROLITHIASIS, HX OF 12/24/2007  . Diabetes mellitus with renal complications ([place]) 11/91/4782  . Essential hypertension 07/13/2007    Past Surgical History:  Procedure Laterality Date  . ABDOMINAL HYSTERECTOMY     "  still have my ovaries"  . CARDIAC CATHETERIZATION  2002  . CARDIAC CATHETERIZATION  09/17/2017  . CATARACT EXTRACTION W/ INTRAOCULAR LENS  IMPLANT, BILATERAL Bilateral   . COLONOSCOPY W/ BIOPSIES AND POLYPECTOMY  2011  . CORONARY STENT INTERVENTION N/A 09/21/2017   Procedure: CORONARY STENT INTERVENTION;  Surgeon: Troy Sine, MD;  Location: [facility] CV LAB;  Service: Cardiovascular;  Laterality: N/A;  . DILATION AND CURETTAGE OF UTERUS    . EXCISIONAL HEMORRHOIDECTOMY  X 2  . GANGLION CYST EXCISION Left X 3  . INTRAVASCULAR PRESSURE WIRE/FFR STUDY N/A 09/17/2017   Procedure: INTRAVASCULAR PRESSURE WIRE/FFR STUDY;  Surgeon: Nelva Bush, MD;  Location: [facility] CV LAB;  Service: Cardiovascular;  Laterality: N/A;  . NASAL SINUS SURGERY    . PORTA CATH INSERTION Right 2013  . RIGHT/LEFT HEART CATH AND CORONARY ANGIOGRAPHY N/A 09/17/2017   Procedure: RIGHT/LEFT HEART CATH AND CORONARY ANGIOGRAPHY;  Surgeon: Nelva Bush, MD;  Location: [facility] CV LAB;  Service: Cardiovascular;  Laterality: N/A;  . TUBAL LIGATION      OB History    No data available       Home Medications    Prior to Admission medications   Medication Sig Start Date End Date Taking? Authorizing Provider  amLODipine (NORVASC) 5 MG tablet Take 1 tablet (5 mg total) by mouth daily. 02/10/17  Yes Marletta Lor, MD  aspirin 81 MG chewable tablet Chew 1 tablet (81 mg total) by mouth daily. 08/29/17  Yes Barton Dubois, MD  budesonide-formoterol [facility]) 160-4.5 MCG/ACT inhaler Inhale 2 puffs into the lungs 2 (two) times daily. Patient taking differently: Inhale 2 puffs into the lungs 2 (two) times daily as needed.  05/08/17 09/23/17 Yes Parrett, Fonnie Mu, NP  clopidogrel (PLAVIX) 75 MG tablet Take 1 tablet (75 mg total) by mouth daily with breakfast. 09/23/17  Yes Duke, Tami Lin, PA  furosemide (LASIX) 40 MG tablet Take 1 tablet (40 mg total) by mouth daily. 02/10/17  Yes Marletta Lor, MD  HYDROcodone-acetaminophen (NORCO/VICODIN) 5-325 MG tablet Take 1 tablet by mouth every 6 (six) hours as needed for moderate pain. 09/08/17  Yes Marletta Lor, MD  Insulin Glargine (TOUJEO SOLOSTAR) 300 UNIT/ML SOPN Inject 24 Units into the skin at bedtime. 09/08/17  Yes Marletta Lor, MD  insulin lispro  (HUMALOG) 100 UNIT/ML KiwkPen Blood sugar less than 70- no insulin Blood sugar 71-150- 6 units Blood sugar 151- 200- 8 units Blood sugar over 200-  10 units Patient taking differently: Inject into the skin 3 (three) times daily. Blood sugar less than 70- no insulin Blood sugar 71-150- 6 units Blood sugar 151- 200- 8 units Blood sugar over 200-  10 units 09/08/17  Yes Marletta Lor, MD  isosorbide mononitrate (IMDUR) 30 MG 24 hr tablet Take 1 tablet (30 mg total) by mouth daily. 08/29/17  Yes Barton Dubois, MD  LORazepam (ATIVAN) 0.5 MG tablet Take 1 tablet (0.5 mg total) by mouth every 6 (six) hours as needed for anxiety. 02/15/16  Yes Marletta Lor, MD  magnesium oxide (MAG-OX) 400 (241.3 Mg) MG tablet Take 2 tablets (800 mg total) by mouth 2 (two) times daily. 02/15/16  Yes Marletta Lor, MD  metoprolol succinate (TOPROL XL) 100 MG 24 hr tablet Take 1 tablet (100 mg total) by mouth daily. Take with or immediately following a meal. 09/22/17 09/22/18 Yes Duke, Tami Lin, PA  nitroGLYCERIN (NITROSTAT) 0.4 MG SL tablet Place 1 tablet (0.4 mg total) under  the tongue every 5 (five) minutes as needed for chest pain. 04/25/15  Yes Marletta Lor, MD  polyethylene glycol powder [facility]) powder Take 17 grams by mouth in 8 ounces of water one to two times daily for constipation. 09/22/17  Yes Duke, Tami Lin, PA  promethazine (PHENERGAN) 12.5 MG tablet Take 2 tablets (25 mg total) by mouth every 6 (six) hours as needed for nausea. 05/10/13  Yes [name], PA-C  simvastatin (ZOCOR) 20 MG tablet Take 1 tablet (20 mg total) by mouth every evening. 02/10/17  Yes Marletta Lor, MD    Family History Family History  Problem Relation Age of Onset  . Cancer Father        throat ca  . COPD Father   . Cancer Sister        uterine ca and breast ca  . Cancer Brother        throat  . Congestive Heart Failure Mother   . Heart attack Maternal Grandmother   .  Colon cancer Neg Hx     Social History Social History  Substance Use Topics  . Smoking status: Former Smoker    Packs/day: 1.00    Years: 20.00    Types: Cigarettes    Quit date: 12/08/2000  . Smokeless tobacco: Never Used  . Alcohol use No     Allergies   Patient has no known allergies.   Review of Systems Review of Systems  Constitutional: Negative for chills, diaphoresis and fever.  Respiratory: Negative for shortness of breath.   Cardiovascular: Positive for chest pain.  Gastrointestinal: Negative for abdominal pain, diarrhea, nausea and vomiting.  Neurological: Negative for dizziness and light-headedness.  All other systems reviewed and are negative.    Physical Exam Updated Vital Signs Pulse 99   Temp 98 F (36.7 C) (Oral)   Resp 16   SpO2 97%   Physical Exam  Constitutional: She appears well-developed and well-nourished. No distress.  HENT:  Head: Normocephalic and atraumatic.  Eyes: Conjunctivae are normal.  Neck: Neck supple.  Cardiovascular: Normal rate, regular rhythm, normal heart sounds and intact distal pulses.   Pulmonary/Chest: Effort normal and breath sounds normal. No respiratory distress. She exhibits no tenderness.  No noted increased work of breathing at rest. Patient speaks in full sentences without difficulty.  Abdominal: Soft. There is no tenderness. There is no guarding.  Musculoskeletal: She exhibits edema.  Bilateral lower extremity pitting edema that the patient states is normal for her.  Lymphadenopathy:    She has no cervical adenopathy.  Neurological: She is alert.  Skin: Skin is warm and dry. Capillary refill takes less than 2 seconds. She is not diaphoretic.  PCI entry site noted left anterior forearm. No signs of dehiscence of wound. Some old appearing bruising noted. No surrounding swelling or erythema.  Psychiatric: She has a normal mood and affect. Her behavior is normal.  Nursing note and vitals reviewed.    ED  Treatments / Results  Labs (all labs ordered are listed, but only abnormal results are displayed) Labs Reviewed  COMPREHENSIVE METABOLIC PANEL - Abnormal; Notable for the following:       Result Value   Glucose, Bld 288 (*)    Creatinine, Ser 1.44 (*)    Calcium 8.5 (*)    Total Protein 5.3 (*)    Albumin 3.1 (*)    GFR calc non Af Amer 35 (*)    GFR calc Af Amer 40 (*)    All other components  within normal limits  CBC WITH DIFFERENTIAL/PLATELET - Abnormal; Notable for the following:    RBC 3.11 (*)    Hemoglobin 9.5 (*)    HCT 29.9 (*)    Platelets 127 (*)    All other components within normal limits  I-STAT TROPONIN, ED - Abnormal; Notable for the following:    Troponin i, poc 0.16 (*)    All other components within normal limits    EKG  EKG Interpretation  Date/Time:  Wednesday September 23 2017 09:34:30 EDT Ventricular Rate:  94 PR Interval:  272 QRS Duration: 76 QT Interval:  382 QTC Calculation: 477 R Axis:   92 Text Interpretation:  Sinus rhythm with 1st degree A-V block with occasional Premature ventricular complexes Rightward axis Low voltage QRS T wave abnormality, consider inferolateral ischemia Prolonged QT Abnormal ECG No STEMI.  Confirmed by Nanda Quinton [id number] on 09/23/2017 11:35:54 AM       Radiology Dg Chest 2 View  Result Date: 09/23/2017 CLINICAL DATA:  Chest pain EXAM: CHEST  2 VIEW COMPARISON:  08/24/2017 FINDINGS: Porta catheter on the right with tip at the upper cavoatrial junction. Chronic cardiomegaly. EKG leads create artifact over the chest. There is no edema, consolidation, effusion, or pneumothorax. Stable mediastinal contours. IMPRESSION: 1. No evidence of active disease. 2. Chronic cardiomegaly. Electronically Signed   By: Monte Fantasia M.D.   On: 09/23/2017 11:42    Procedures Procedures (including critical care time)  Medications Ordered in ED Medications  sodium chloride flush (NS) 0.9 % injection 10-40 mL (not administered)  0.9  %  sodium chloride infusion (not administered)     Initial Impression / Assessment and Plan / ED Course  I have reviewed the triage vital signs and the nursing notes.  Pertinent labs & imaging results that were available during my care of the patient were reviewed by me and considered in my medical decision making (see chart for details).  Clinical Course as of Sep 23 1416  Wed Sep 23, 2017  1308 Spoke with Wannetta Sender with cardiology. States she will send someone to evaluate the patient.  [SJ]    Clinical Course User Index [SJ] Linell Meldrum C, PA-C    Patient presents with episodes of chest discomfort today status post cardiac cath. No recurrence of chest pain since this morning or during ED course. Cardiology was consulted and evaluated the patient. Cardiology to admit with plan for repeat cardiac cath tomorrow.  Findings and plan of care discussed with Nanda Quinton, MD.   Vitals:   09/23/17 1302 09/23/17 1315 09/23/17 1345 09/23/17 1400  BP: (!) 132/50 (!) 131/50 (!) 113/47 (!) 121/59  Pulse: 89 85 88 95  Resp: (!) 21 (!) 21 (!) 27 (!) 24  Temp:      TempSrc:      SpO2: 94% 94% 96% 95%     Recent procedure summary: Patient underwent cardiac cath on October 11, was found to have multiple areas of stenosis. Consultation for possible CABG was obtained, but patient was ultimately not a candidate. PCI with stent placement performed on October 15.   Cardiac cath and stent placement on 09/21/17: Performed by Dr. Claiborne Billings  A STENT SIERRA 2.50 X 23 MM drug eluting stent was successfully placed.  Prox RCA to Mid RCA lesion, 90 %stenosed.  Post intervention, there is a 0% residual stenosis.   Difficult but successful PCI to the mid RCA with ultimate insertion of a 2.523 mm Xience Sierra DES stent postdilated to 2.8 mm with  the 90% stenosis being reduced to 0%.  RECOMMENDATION: The patient should continue with DAPT, probably indefinitely with her concomitant CAD since she was turned down  for CABG revascularization.  Plan to hydrate the patient.  Increased medical therapy.  Consider staged PCI to the LAD since FFR was positive later this week or possibly in several weeks to allow for stabilization of renal function in this patient with renal insufficiency who already has undergone cath on 10/12 and PCI today vs medical therapy unless increase in symptoms.    Cardiac cath on 09/17/17: Performed by Dr. Saunders Revel. Conclusions: 1. Significant multivessel coronary artery disease, including sequential 40% ostial and 50% distal LMCA stenoses, sequential proximal and mid LAD stenoses of 50-60%, 40% distal LCx lesion, and 80% proximal to mid RCA stenosis. FFR of the LAD is hemodynamically significant (FFR 0.72). 2. Mildly elevated left and right heart filling pressures. 3. Normal Fick cardiac output  Recommendations: 1. Admit for medical optimization and cardiac surgery consultation for bypass, given the patient's multivessel CAD, diabetes mellitus, and reduced LVEF. 2. Gentle hydration tonight. Restart diuresis tomorrow based on renal function. 3. Aggressive secondary prevention.  Final Clinical Impressions(s) / ED Diagnoses   Final diagnoses:  Chest pain, unspecified type    New Prescriptions New Prescriptions   No medications on file     Layla Maw 09/23/17 1452    Lorayne Bender, PA-C 09/23/17 1608    Margette Fast, MD 09/24/17 [phone]

## 2017-09-23 NOTE — ED Notes (Signed)
Lab results reported to Dr. Laverta Baltimore.

## 2017-09-23 NOTE — ED Notes (Signed)
Pt placed on pure wick at her request

## 2017-09-23 NOTE — Telephone Encounter (Addendum)
Pt called complaining of chest burning same symptoms as previous to cath on Monday.Pt has taken ntg with relief per hx pt has disease affecting other arteries and is not bypass candidate also pt states has bad kidney's .Discussed with Dr Irish Lack and pt needs to return to hospiatl via  ER pt is aware and will have husband transport her to  ER .Adonis Housekeeper

## 2017-09-23 NOTE — ED Notes (Addendum)
Pts wishes are not to be struck for blood in triage but she would like to go to a room and have her port accessed due to recent admission and multiple IV attempts. Pt has has redness and swelling in her right arm where cath was done. Will move to next available room.

## 2017-09-24 ENCOUNTER — Other Ambulatory Visit: Payer: Self-pay | Admitting: *Deleted

## 2017-09-24 ENCOUNTER — Encounter (HOSPITAL_COMMUNITY)
Admission: EM | Disposition: A | Discharge status: Home / Self Care | Payer: Self-pay | Source: Home / Self Care | Attending: Emergency Medicine

## 2017-09-24 DIAGNOSIS — I2510 Atherosclerotic heart disease of native coronary artery without angina pectoris: Secondary | ICD-10-CM | POA: Diagnosis not present

## 2017-09-24 DIAGNOSIS — I209 Angina pectoris, unspecified: Secondary | ICD-10-CM

## 2017-09-24 DIAGNOSIS — I5042 Chronic combined systolic (congestive) and diastolic (congestive) heart failure: Secondary | ICD-10-CM | POA: Diagnosis not present

## 2017-09-24 DIAGNOSIS — I429 Cardiomyopathy, unspecified: Secondary | ICD-10-CM | POA: Diagnosis not present

## 2017-09-24 DIAGNOSIS — E1122 Type 2 diabetes mellitus with diabetic chronic kidney disease: Secondary | ICD-10-CM | POA: Diagnosis not present

## 2017-09-24 DIAGNOSIS — I130 Hypertensive heart and chronic kidney disease with heart failure and stage 1 through stage 4 chronic kidney disease, or unspecified chronic kidney disease: Secondary | ICD-10-CM | POA: Diagnosis not present

## 2017-09-24 DIAGNOSIS — C9201 Acute myeloblastic leukemia, in remission: Secondary | ICD-10-CM | POA: Diagnosis not present

## 2017-09-24 DIAGNOSIS — J449 Chronic obstructive pulmonary disease, unspecified: Secondary | ICD-10-CM | POA: Diagnosis not present

## 2017-09-24 DIAGNOSIS — N184 Chronic kidney disease, stage 4 (severe): Secondary | ICD-10-CM | POA: Diagnosis not present

## 2017-09-24 DIAGNOSIS — I200 Unstable angina: Secondary | ICD-10-CM | POA: Diagnosis not present

## 2017-09-24 DIAGNOSIS — R079 Chest pain, unspecified: Secondary | ICD-10-CM | POA: Diagnosis present

## 2017-09-24 DIAGNOSIS — I25110 Atherosclerotic heart disease of native coronary artery with unstable angina pectoris: Secondary | ICD-10-CM | POA: Diagnosis not present

## 2017-09-24 DIAGNOSIS — E785 Hyperlipidemia, unspecified: Secondary | ICD-10-CM | POA: Diagnosis not present

## 2017-09-24 LAB — GLUCOSE, CAPILLARY
GLUCOSE-CAPILLARY: 240 mg/dL — AB (ref 65–99)
GLUCOSE-CAPILLARY: 190 mg/dL — AB (ref 65–99)

## 2017-09-24 LAB — CBC
HCT: 25.7 % — ABNORMAL LOW (ref 36.0–46.0)
Hemoglobin: 7.9 g/dL — ABNORMAL LOW (ref 12.0–15.0)
MCH: 29.8 pg (ref 26.0–34.0)
MCHC: 30.7 g/dL (ref 30.0–36.0)
MCV: 97 fL (ref 78.0–100.0)
PLATELETS: 128 10*3/uL — AB (ref 150–400)
RBC: 2.65 MIL/uL — ABNORMAL LOW (ref 3.87–5.11)
RDW: 15 % (ref 11.5–15.5)
WBC: 3.8 10*3/uL — ABNORMAL LOW (ref 4.0–10.5)

## 2017-09-24 LAB — PROTIME-INR
INR: 1.11
PROTHROMBIN TIME: 14.3 s (ref 11.4–15.2)

## 2017-09-24 LAB — BASIC METABOLIC PANEL
Anion gap: 8 (ref 5–15)
BUN: 14 mg/dL (ref 6–20)
CALCIUM: 8.3 mg/dL — AB (ref 8.9–10.3)
CHLORIDE: 107 mmol/L (ref 101–111)
CO2: 25 mmol/L (ref 22–32)
CREATININE: 1.38 mg/dL — AB (ref 0.44–1.00)
GFR calc non Af Amer: 36 mL/min — ABNORMAL LOW (ref >60)
GFR, EST AFRICAN AMERICAN: 42 mL/min — AB (ref >60)
GLUCOSE: 243 mg/dL — AB (ref 65–99)
Potassium: 4.3 mmol/L (ref 3.5–5.1)
Sodium: 140 mmol/L (ref 135–145)

## 2017-09-24 LAB — TROPONIN I: Troponin I: 0.13 ng/mL (ref <0.03)

## 2017-09-24 SURGERY — CORONARY STENT INTERVENTION
Anesthesia: LOCAL

## 2017-09-24 MED ORDER — HEPARIN SOD (PORK) LOCK FLUSH 100 UNIT/ML IV SOLN
500.0000 [IU] | INTRAVENOUS | Status: AC | PRN
  Administered 2017-09-24: 500 [IU]

## 2017-09-24 MED ORDER — ATORVASTATIN CALCIUM 40 MG PO TABS
40.0000 mg | ORAL_TABLET | Freq: Every day | ORAL | 11 refills | Status: DC
Start: 2017-09-24 — End: 2017-10-09

## 2017-09-24 MED ORDER — NITROGLYCERIN 0.4 MG SL SUBL: 0.4000 mg | SUBLINGUAL_TABLET | SUBLINGUAL | 1 refills | Status: DC | PRN

## 2017-09-24 NOTE — Care Management CC44 (Signed)
Condition Code 44 Documentation Completed  Patient Details  Name: [name] MRN: [id number] Date of Birth: 01-Feb-1941   Condition Code 44 given:  Yes Patient signature on Condition Code 44 notice:  Yes Documentation of 2 MD's agreement:  Yes Code 44 added to claim:  Yes    Carles Collet, RN 09/24/2017, 11:40 AM

## 2017-09-24 NOTE — Progress Notes (Signed)
Inpatient Diabetes Program Recommendations  AACE/ADA: New Consensus Statement on Inpatient Glycemic Control (2015)  Target Ranges:  Prepandial:   less than 140 mg/dL      Peak postprandial:   less than 180 mg/dL (1-2 hours)      Critically ill patients:  140 - 180 mg/dL   Lab Results  Component Value Date   GLUCAP 240 (H) 09/24/2017   HGBA1C 7.7 (H) 09/20/2017    Review of Glycemic Control Results for [name], [name] (MRN [id number]) as of 09/24/2017 08:32  Ref. Range 09/23/2017 21:06 09/24/2017 07:49  Glucose-Capillary Latest Ref Range: 65 - 99 mg/dL 330 (H) 240 (H)   Diabetes history: Type 2 DM Outpatient Diabetes medications: Toujeo 24 units q HS, Humalog kwik pen 71-150- 6 units, 151-200-8 units, Blood sugar>200-10 units Current orders for Inpatient glycemic control:  Novolog moderate tid with meals and HS   Inpatient Diabetes Program Recommendations:    -Lantus 20 units qd -Add Novolog 4 units tid meal coverage post procedure and patient eating @ least 50%  Thank you, Bethena Roys E. Jamair Cato, RN, MSN, CDE  Diabetes Coordinator Inpatient Glycemic Control Team Team Pager [phone] (8am-5pm) 09/24/2017 8:34 AM

## 2017-09-24 NOTE — Care Management Obs Status (Signed)
[facility] NOTIFICATION   Patient Details  Name: [name] MRN: [id number] Date of Birth: [birth year]   Medicare Observation Status Notification Given:  Yes    Carles Collet, RN 09/24/2017, 11:40 AM

## 2017-09-24 NOTE — Progress Notes (Signed)
Progress Note  Patient Name: [name] Date of Encounter: 09/24/2017  Primary Cardiologist:  Dr. Acie Fredrickson  Subjective   Feels better. No CP since episode which brought her into ER. No bleeding. In bed.   Inpatient Medications    Scheduled Meds: . amLODipine  5 mg Oral Daily  . aspirin EC  81 mg Oral Daily  . clopidogrel  75 mg Oral Q breakfast  . heparin  5,000 Units Subcutaneous Q8H  . insulin aspart  0-15 Units Subcutaneous TID WC  . insulin aspart  0-5 Units Subcutaneous QHS  . isosorbide mononitrate  30 mg Oral Daily  . magnesium oxide  800 mg Oral BID  . metoprolol succinate  100 mg Oral Daily  . mometasone-formoterol  2 puff Inhalation BID  . simvastatin  20 mg Oral QPM  . sodium chloride flush  3 mL Intravenous Q12H   Continuous Infusions: . sodium chloride    . sodium chloride 1 mL/kg/hr (09/24/17 0653)   PRN Meds: sodium chloride, acetaminophen, HYDROcodone-acetaminophen, LORazepam, nitroGLYCERIN, ondansetron (ZOFRAN) IV, polyethylene glycol, promethazine, sodium chloride flush, sodium chloride flush   Vital Signs    Vitals:   09/24/17 0013 09/24/17 0210 09/24/17 0535 09/24/17 0800  BP: (!) 133/48  (!) 141/55 (!) 134/48  Pulse: 88  95 89  Resp: 20  20 (!) 24  Temp: 98.1 F (36.7 C)  98.2 F (36.8 C) 97.7 F (36.5 C)  TempSrc: Oral  Oral Oral  SpO2: 93%  95% 97%  Weight:  235 lb 12.8 oz (107 kg)    Height:        Intake/Output Summary (Last 24 hours) at 09/24/17 0850 Last data filed at 09/24/17 0600  Gross per 24 hour  Intake            570.4 ml  Output              600 ml  Net            -29.6 ml   Filed Weights   09/23/17 1959 09/24/17 0210  Weight: 234 lb 9.6 oz (106.4 kg) 235 lb 12.8 oz (107 kg)    Telemetry    NSR, no VT - Personally Reviewed  ECG    TWI chronic on EG - Personally Reviewed  Physical Exam   GEN: No acute distress.  obese Neck: No JVD Cardiac: RRR, no murmurs, rubs, or gallops.  Respiratory: Clear to  auscultation bilaterally. GI: Soft, nontender, non-distended  MS: No edema; No deformity. Neuro:  Nonfocal  Psych: Normal affect   Labs    Chemistry Recent Labs Lab 09/18/17 0335  09/22/17 0555 09/23/17 1230 09/24/17 0436  NA 137  < > 139 138 140  K 3.8  < > 4.0 4.5 4.3  CL 103  < > 106 106 107  CO2 24  < > 27 24 25   GLUCOSE 338*  < > 97 288* 243*  BUN 17  < > 16 15 14   CREATININE 1.50*  < > 1.40* 1.44* 1.38*  CALCIUM 8.4*  < > 8.2* 8.5* 8.3*  PROT 5.7*  --   --  5.3*  --   ALBUMIN 3.2*  --   --  3.1*  --   AST 20  --   --  25  --   ALT 14  --   --  20  --   ALKPHOS 83  --   --  79  --   BILITOT 0.8  --   --  0.9  --   GFRNONAA 33*  < > 36* 35* 36*  GFRAA 38*  < > 41* 40* 42*  ANIONGAP 10  < > 6 8 8   < > = values in this interval not displayed.   Hematology Recent Labs Lab 09/22/17 0555 09/23/17 1230 09/24/17 0436  WBC 5.0 4.3 3.8*  RBC 2.81* 3.11* 2.65*  HGB 8.4* 9.5* 7.9*  HCT 26.8* 29.9* 25.7*  MCV 95.4 96.1 97.0  MCH 29.9 30.5 29.8  MCHC 31.3 31.8 30.7  RDW 14.5 15.1 15.0  PLT 142* 127* 128*    Cardiac Enzymes Recent Labs Lab 09/23/17 2052 09/24/17 0436  TROPONINI 0.17* 0.13*    Recent Labs Lab 09/23/17 1242  TROPIPOC 0.16*     BNPNo results for input(s): BNP, PROBNP in the last 168 hours.   DDimer No results for input(s): DDIMER in the last 168 hours.   Radiology    Dg Chest 2 View  Result Date: 09/23/2017 CLINICAL DATA:  Chest pain EXAM: CHEST  2 VIEW COMPARISON:  08/24/2017 FINDINGS: Porta catheter on the right with tip at the upper cavoatrial junction. Chronic cardiomegaly. EKG leads create artifact over the chest. There is no edema, consolidation, effusion, or pneumothorax. Stable mediastinal contours. IMPRESSION: 1. No evidence of active disease. 2. Chronic cardiomegaly. Electronically Signed   By: Monte Fantasia M.D.   On: 09/23/2017 11:42    Cardiac Studies   Cath reviewed.   Patient Profile     76 y.o. female with mildly  elevated down trending troponin likely from recent PCI, morbid obesity, CAD with recent RCA stent and residual LAD diffuse disease (despite FFR +, many 30-40% lesions scattered)  Assessment & Plan    Angina/CAD  - discussed with Dr. Saunders Revel of interventional cardiology who examined her films and discussed with other colleagues. We are not going to proceed with PCI to LAD because of diffuse nature of moderate disease, would have to stent the entire vessel. Increased risk. This makes sense. Focal RCA lesion is stable. No evidence of stent thrombosis.   - Continue with DAPT, imdur and metoprolol.  - Remember, previously deemed a non surgical candidate.   Leukemia  - Sees Dr. [name].   - Anemia noted. She has required transfusion in the past. In part her Hg today is dilutional. She will be seeing Dr. [name] soon. Can transfuse at their discretion.   Will ambulate the hall. If feeling well, ok for DC home. Back to work Monday.   Diabetes  - continue outpt reg.    For questions or updates, please contact [facility] Please consult [email or web address] for contact info under Cardiology/STEMI.      Signed, Candee Furbish, MD  09/24/2017, 8:50 AM

## 2017-09-24 NOTE — Discharge Summary (Signed)
Discharge Summary    Patient ID: [name],  MRN: [id number], DOB/AGE: 1941-05-01 76 y.o.  Admit date: 09/23/2017 Discharge date: 09/24/2017   Primary Care Provider: Marletta Lor Primary Cardiologist: Dr. Acie Fredrickson  Discharge Diagnoses    Active Problems:   Unstable angina [facility])   Chest pain   Allergies No Known Allergies   History of Present Illness     [name] is a 76 year old woman with history of nonobstructive CAD, hypertension, hyperlipidemia, AML in remission, CKD, chronic anemia, and moderate COPD. She was hospitalized last month due to worsening dyspnea on exertion and burning in her chest. At the time, echo revealed LVEF of 40-45% with apical hypokinesis. Troponin also rose up to 0.47. In the setting of acute on chronic kidney disease and 1/2 positive blood cultures, the decision was made to manage her medically rather than pursue cardiac catheterization at that time. She was seen in clinic forfollow-up 09/16/17 and noted continued significant exertional dyspnea (NYHA III). Creatinine has returned to baseline. She was scheduled for elective outpatient left and right heart catheterization.   On heart caht, she had sequential 40% ostial and 50% distal LMCA stenoses, sequential proximal and mid LAD stenoses of 50-60%, 40% distal LCx lesion, and 80% proximal to mid RCA stenosis. FFR of the LAD is hemodynamically significant (FFR 0.72). She also had mildly elevated left and right heart filling pressures but normal cardiac output. She was admitted and TCTS was consulted for CABG revascularization, but she was ultimately turned down for surgery given her comorbidities. She returned to the cath lab for stent placement to the RCA on 09/21/17. It was recommended that she continue on lifelong DAPT with ASA and plavix.   She returned to [facility] today 09/23/17 with 2 episodes of chest burning. This is described as the same sensation she had prior to her recent heart cath.she  took one sublingual nitroglycerin with relief of her chest burning. She denies shortness of breath, palpitations, dizziness, lightheadedness, syncope, lower extremity edema. She was able to go to bed and rest. However; she woke up with the chest burning at approximately 5 AM this morning. The pain lasted about 5 minutes before subsiding. She waited until 8 AM this morning to call the [employer] for advice. She was advised to return to [facility] ED. On my interview, she is not currently having chest pain. She has had no further chest pain since 5 AM this morning. She did not take a nitroglycerin SL this morning. She has received no medications in the ER.   Hospital Course     Consultants: none  Troponin this admission: 0.17 --> 0.13. EKG was unchanged.  Patient was admitted and tentatively scheduled for repeat cardiac catheterization with possible intervention to her LAD. However, films were reviewed by Dr. Saunders Revel and colleagues this morning and she was deemed not a candidate for LAD intervention, given her diffuse disease throughout the entire vessel. She has been chest pain free since her admission. I discussed the use of SL nitro with her if her chest pain returns. She has follow up already scheduled with [place].  Her Hb was found to be 7.9, down from 9.5 at admission. She ambulated in the halls and was at her baseline SOB and ability to walk. She states that she feels safe to return home today. She has close follow up with her hematologist.  She was discharged on ASA, plavix, lipitor, 100 mg toprol, imdur, and norvasc. She was also  provided with a new prescription for nitro.   Her discharge creatinine is 1.38 (1.44), which is at her baseline.  Patient seen and examined by Dr. Marlou Porch today and was stable for discharge. All follow up has been arranged.  _____________  Discharge Vitals Blood pressure (!) 130/48, pulse 75, temperature 98.4 F (36.9 C), temperature source Oral, resp. rate (!)  22, height 5\' 2"  (1.575 m), weight 235 lb 12.8 oz (107 kg), SpO2 93 %.  Filed Weights   09/23/17 1959 09/24/17 0210  Weight: 234 lb 9.6 oz (106.4 kg) 235 lb 12.8 oz (107 kg)    Labs & Radiologic Studies    CBC  Recent Labs  09/23/17 1230 09/24/17 0436  WBC 4.3 3.8*  NEUTROABS 2.8  --   HGB 9.5* 7.9*  HCT 29.9* 25.7*  MCV 96.1 97.0  PLT 127* 793*   Basic Metabolic Panel  Recent Labs  09/23/17 1230 09/24/17 0436  NA 138 140  K 4.5 4.3  CL 106 107  CO2 24 25  GLUCOSE 288* 243*  BUN 15 14  CREATININE 1.44* 1.38*  CALCIUM 8.5* 8.3*   Liver Function Tests  Recent Labs  09/23/17 1230  AST 25  ALT 20  ALKPHOS 79  BILITOT 0.9  PROT 5.3*  ALBUMIN 3.1*   No results for input(s): LIPASE, AMYLASE in the last 72 hours. Cardiac Enzymes  Recent Labs  09/23/17 2052 09/24/17 0436  TROPONINI 0.17* 0.13*   BNP Invalid input(s): POCBNP D-Dimer No results for input(s): DDIMER in the last 72 hours. Hemoglobin A1C No results for input(s): HGBA1C in the last 72 hours. Fasting Lipid Panel No results for input(s): CHOL, HDL, LDLCALC, TRIG, CHOLHDL, LDLDIRECT in the last 72 hours. Thyroid Function Tests No results for input(s): TSH, T4TOTAL, T3FREE, THYROIDAB in the last 72 hours.  Invalid input(s): FREET3 _____________  Dg Chest 2 View  Result Date: 09/23/2017 CLINICAL DATA:  Chest pain EXAM: CHEST  2 VIEW COMPARISON:  08/24/2017 FINDINGS: Porta catheter on the right with tip at the upper cavoatrial junction. Chronic cardiomegaly. EKG leads create artifact over the chest. There is no edema, consolidation, effusion, or pneumothorax. Stable mediastinal contours. IMPRESSION: 1. No evidence of active disease. 2. Chronic cardiomegaly. Electronically Signed   By: Monte Fantasia M.D.   On: 09/23/2017 11:42     Diagnostic Studies/Procedures    Left Heart Cath 09/21/17:  A STENT SIERRA 2.50 X 23 MM drug eluting stent was successfully placed.  Prox RCA to Mid RCA  lesion, 90 %stenosed.  Post intervention, there is a 0% residual stenosis.  Difficult but successful PCI to the mid RCA with ultimate insertion of a 2.523 mm Xience Sierra DES stent postdilated to 2.8 mm with the 90% stenosis being reduced to 0%.  RECOMMENDATION: The patient should continue with DAPT, probably indefinitely with her concomitant CAD since she was turned down for CABG revascularization. Plan to hydrate the patient. Increased medical therapy. Consider staged PCI to the LAD since FFR was positive later this week or possibly in several weeks to allow for stabilization of renal function in this patient with renal insufficiency who already has undergone cath on 10/12 and PCI today vs medical therapy unless increase in symptoms.   Diagnostic Diagram       Post-Intervention Diagram           Right and Left Heart Cath 09/17/17: Conclusions: 1. Significant multivessel coronary artery disease, including sequential 40% ostial and 50% distal LMCA stenoses, sequential proximal and mid LAD  stenoses of 50-60%, 40% distal LCx lesion, and 80% proximal to mid RCA stenosis. FFR of the LAD is hemodynamically significant (FFR 0.72). 2. Mildly elevated left and right heart filling pressures. 3. Normal Fick cardiac output  Recommendations: 1. Admit for medical optimization and cardiac surgery consultation for bypass, given the patient's multivessel CAD, diabetes mellitus, and reduced LVEF. 2. Gentle hydration tonight. Restart diuresis tomorrow based on renal function. 3. Aggressive secondary prevention.  Diagnostic Diagram          Echocardiogram 08/24/17: Study Conclusions - Left ventricle: The cavity size was normal. There was moderate concentric hypertrophy. Systolic function was mildly to moderately reduced. The estimated ejection fraction was in the range of 40% to 45%. Hypokinesis of all mid-apical segments. Apical akinesis. - Aortic valve:  Transvalvular velocity was within the normal range. There was no stenosis. There was no regurgitation. - Mitral valve: Mild thickening and calcification. Transvalvular velocity was within the normal range. There was no evidence for stenosis. There was trivial regurgitation. - Right ventricle: The cavity size was normal. Wall thickness was normal. - Tricuspid valve: There was trivial regurgitation. - Pulmonary arteries: Systolic pressure was moderately increased. PA peak pressure: 51 mm Hg (S).   Disposition   Pt is being discharged home today in good condition.  Follow-up Plans & Appointments    Follow-up Information    Liliane Shi, PA-C Follow up on 10/06/2017.   Specialties:  Cardiology, Physician Assistant Why:  3:15 pm for hospital follow up and TOC Contact information: [address] [place] [address] [phone]     Discharge Instructions    Diet - low sodium heart healthy    Complete by:  As directed    Increase activity slowly    Complete by:  As directed      Discharge Medications   Current Discharge Medication List    START taking these medications   Details  atorvastatin (LIPITOR) 40 MG tablet Take 1 tablet (40 mg total) by mouth daily. Qty: 30 tablet, Refills: 11      CONTINUE these medications which have CHANGED   Details  nitroGLYCERIN (NITROSTAT) 0.4 MG SL tablet Place 1 tablet (0.4 mg total) under the tongue every 5 (five) minutes as needed for chest pain. Qty: 24 tablet, Refills: 1      CONTINUE these medications which have NOT CHANGED   Details  amLODipine (NORVASC) 5 MG tablet Take 1 tablet (5 mg total) by mouth daily. Qty: 90 tablet, Refills: 1    aspirin 81 MG chewable tablet Chew 1 tablet (81 mg total) by mouth daily. Qty: 30 tablet, Refills: 1    budesonide-formoterol (SYMBICORT) 160-4.5 MCG/ACT inhaler Inhale 2 puffs into the lungs 2 (two) times daily. Qty: 1 Inhaler, Refills: 5      clopidogrel (PLAVIX) 75 MG tablet Take 1 tablet (75 mg total) by mouth daily with breakfast. Qty: 30 tablet, Refills: 11    furosemide (LASIX) 40 MG tablet Take 1 tablet (40 mg total) by mouth daily. Qty: 90 tablet, Refills: 1    HYDROcodone-acetaminophen (NORCO/VICODIN) 5-325 MG tablet Take 1 tablet by mouth every 6 (six) hours as needed for moderate pain. Qty: 120 tablet, Refills: 0    Insulin Glargine (TOUJEO SOLOSTAR) 300 UNIT/ML SOPN Inject 24 Units into the skin at bedtime. Qty: 1 pen, Refills: 6    insulin lispro (HUMALOG) 100 UNIT/ML KiwkPen Blood sugar less than 70- no insulin Blood sugar 71-150- 6 units Blood sugar 151- 200- 8 units  Blood sugar over 200-  10 units Qty: 15 mL, Refills: 11    isosorbide mononitrate (IMDUR) 30 MG 24 hr tablet Take 1 tablet (30 mg total) by mouth daily. Qty: 30 tablet, Refills: 1    LORazepam (ATIVAN) 0.5 MG tablet Take 1 tablet (0.5 mg total) by mouth every 6 (six) hours as needed for anxiety. Qty: 90 tablet, Refills: 5    magnesium oxide (MAG-OX) 400 (241.3 Mg) MG tablet Take 2 tablets (800 mg total) by mouth 2 (two) times daily. Qty: 360 tablet, Refills: 3    metoprolol succinate (TOPROL XL) 100 MG 24 hr tablet Take 1 tablet (100 mg total) by mouth daily. Take with or immediately following a meal. Qty: 30 tablet, Refills: 11    polyethylene glycol powder (GLYCOLAX/MIRALAX) powder Take 17 grams by mouth in 8 ounces of water one to two times daily for constipation. Qty: 255 g, Refills: 3    promethazine (PHENERGAN) 12.5 MG tablet Take 2 tablets (25 mg total) by mouth every 6 (six) hours as needed for nausea. Qty: 12 tablet, Refills: 0      STOP taking these medications     simvastatin (ZOCOR) 20 MG tablet            Outstanding Labs/Studies   Follow up OP clinic, repeat CBC. Has close follow up with her hematologist.  Duration of Discharge Encounter   Greater than 30 minutes including physician time.  Signed, Tami Lin Duke PA-C 09/24/2017, 1:28 PM  Personally seen and examined. Agree with above.   Subjective   Feels better. No CP since episode which brought her into ER. No bleeding. In bed.   Inpatient Medications    Scheduled Meds: . amLODipine  5 mg Oral Daily  . aspirin EC  81 mg Oral Daily  . clopidogrel  75 mg Oral Q breakfast  . heparin  5,000 Units Subcutaneous Q8H  . insulin aspart  0-15 Units Subcutaneous TID WC  . insulin aspart  0-5 Units Subcutaneous QHS  . isosorbide mononitrate  30 mg Oral Daily  . magnesium oxide  800 mg Oral BID  . metoprolol succinate  100 mg Oral Daily  . mometasone-formoterol  2 puff Inhalation BID  . simvastatin  20 mg Oral QPM  . sodium chloride flush  3 mL Intravenous Q12H   Continuous Infusions: . sodium chloride    . sodium chloride 1 mL/kg/hr (09/24/17 0653)   PRN Meds: sodium chloride, acetaminophen, HYDROcodone-acetaminophen, LORazepam, nitroGLYCERIN, ondansetron (ZOFRAN) IV, polyethylene glycol, promethazine, sodium chloride flush, sodium chloride flush   Vital Signs          Vitals:   09/24/17 0013 09/24/17 0210 09/24/17 0535 09/24/17 0800  BP: (!) 133/48  (!) 141/55 (!) 134/48  Pulse: 88  95 89  Resp: 20  20 (!) 24  Temp: 98.1 F (36.7 C)  98.2 F (36.8 C) 97.7 F (36.5 C)  TempSrc: Oral  Oral Oral  SpO2: 93%  95% 97%  Weight:  235 lb 12.8 oz (107 kg)    Height:        Intake/Output Summary (Last 24 hours) at 09/24/17 0850 Last data filed at 09/24/17 0600  Gross per 24 hour  Intake            570.4 ml  Output              600 ml  Net            -29.6 ml  Filed Weights   09/23/17 1959 09/24/17 0210  Weight: 234 lb 9.6 oz (106.4 kg) 235 lb 12.8 oz (107 kg)    Telemetry    NSR, no VT - Personally Reviewed  ECG    TWI chronic on EG - Personally Reviewed  Physical Exam   GEN:No acute distress.  obese Neck:No JVD Cardiac:RRR, no murmurs, rubs, or gallops.    Respiratory:Clear to auscultation bilaterally. ZO:XWRU, nontender, non-distended  MS:No edema; No deformity. Neuro:Nonfocal  Psych: Normal affect   Labs    Chemistry Last Labs    Recent Labs Lab 09/18/17 0335  09/22/17 0555 09/23/17 1230 09/24/17 0436  NA 137  < > 139 138 140  K 3.8  < > 4.0 4.5 4.3  CL 103  < > 106 106 107  CO2 24  < > 27 24 25   GLUCOSE 338*  < > 97 288* 243*  BUN 17  < > 16 15 14   CREATININE 1.50*  < > 1.40* 1.44* 1.38*  CALCIUM 8.4*  < > 8.2* 8.5* 8.3*  PROT 5.7*  --   --  5.3*  --   ALBUMIN 3.2*  --   --  3.1*  --   AST 20  --   --  25  --   ALT 14  --   --  20  --   ALKPHOS 83  --   --  79  --   BILITOT 0.8  --   --  0.9  --   GFRNONAA 33*  < > 36* 35* 36*  GFRAA 38*  < > 41* 40* 42*  ANIONGAP 10  < > 6 8 8   < > = values in this interval not displayed.     Hematology Last Labs    Recent Labs Lab 09/22/17 0555 09/23/17 1230 09/24/17 0436  WBC 5.0 4.3 3.8*  RBC 2.81* 3.11* 2.65*  HGB 8.4* 9.5* 7.9*  HCT 26.8* 29.9* 25.7*  MCV 95.4 96.1 97.0  MCH 29.9 30.5 29.8  MCHC 31.3 31.8 30.7  RDW 14.5 15.1 15.0  PLT 142* 127* 128*      Cardiac Enzymes Last Labs    Recent Labs Lab 09/23/17 2052 09/24/17 0436  TROPONINI 0.17* 0.13*      Recent Labs Lab 09/23/17 1242  TROPIPOC 0.16*     BNP Last Labs   No results for input(s): BNP, PROBNP in the last 168 hours.     DDimer  Last Labs   No results for input(s): DDIMER in the last 168 hours.     Radiology     Imaging Results (Last 48 hours)  Dg Chest 2 View  Result Date: 09/23/2017 CLINICAL DATA:  Chest pain EXAM: CHEST  2 VIEW COMPARISON:  08/24/2017 FINDINGS: Porta catheter on the right with tip at the upper cavoatrial junction. Chronic cardiomegaly. EKG leads create artifact over the chest. There is no edema, consolidation, effusion, or pneumothorax. Stable mediastinal contours. IMPRESSION: 1. No evidence of active disease. 2. Chronic cardiomegaly.  Electronically Signed   By: Monte Fantasia M.D.   On: 09/23/2017 11:42     Cardiac Studies   Cath reviewed.   Patient Profile     76 y.o. female with mildly elevated down trending troponin likely from recent PCI, morbid obesity, CAD with recent RCA stent and residual LAD diffuse disease (despite FFR +, many 30-40% lesions scattered)  Assessment & Plan    Angina/CAD  - discussed with Dr. Saunders Revel of interventional cardiology who examined her  films and discussed with other colleagues. We are not going to proceed with PCI to LAD because of diffuse nature of moderate disease, would have to stent the entire vessel. Increased risk. This makes sense. Focal RCA lesion is stable. No evidence of stent thrombosis.   - Continue with DAPT, imdur and metoprolol.  - Remember, previously deemed a non surgical candidate.   Leukemia  - Sees Dr. [name].   - Anemia noted. She has required transfusion in the past. In part her Hg today is dilutional. She will be seeing Dr. [name] soon. Can transfuse at their discretion.   Will ambulate the hall. If feeling well, ok for DC home. Back to work Monday.   Diabetes  - continue outpt reg.   Candee Furbish, MD

## 2017-09-24 NOTE — Progress Notes (Signed)
CRITICAL VALUE ALERT  Critical Value:  Troponin 0.17  Date & Time Notied:  09/24/17 0227  Provider Notified: Dr. Johnna Acosta  Orders Received/Actions taken: no new orders

## 2017-09-24 NOTE — Progress Notes (Signed)
AT bedside, pt requesting to speak with MD prior to d/c.  Will wait to deaccess PAC until d/c definite.

## 2017-09-24 NOTE — Plan of Care (Signed)
Problem: Safety: Goal: Ability to remain free from injury will improve Outcome: Progressing Provided pt education regarding safety from falls.  Monitor for dizziness and lightheadedness.  Take rest breaks as needed if she gets short of breath.  Demonstrated purse lip breathing.  Transition from lying to sitting and sitting to standing slowly.    Problem: Pain Managment: Goal: General experience of comfort will improve Outcome: Progressing Monitored patient pain using 0-10 pain scale.    Problem: Activity: Goal: Risk for activity intolerance will decrease Outcome: Progressing Provided pt education regarding ambulation and complications of immobility.   Continue to ambulate as tolerated.

## 2017-09-24 NOTE — Patient Outreach (Signed)
[place] [facility]) Care Management  09/24/2017  [name] 05/26/1941 [id number]  Per chart review patient is currently inpatient status.  Plan: Notify [facility] hospital liaison Stop EMI calls.  Sherrin Daisy, RN BSN [place] Management Coordinator [facility] Care Management  [phone]

## 2017-09-24 NOTE — Consult Note (Signed)
   [facility] CM Inpatient Consult   09/24/2017  [name] 1941/03/28 [id number]  Information received from [place], Vaughan Basta of the patient's hospitalization.  Patient is currently in observation status for unstable angina, HX of diabetes, HF.  Met with the patient at bedside. Patient with a flat affect.  Patient states she prefers to have follow up calls from a live person, EMMI calls were stopped. Patient will have follow up with [place] for HF as she does not subscribe to home visits at this time.  Contact information given.  She endorses Dr. Bluford Kaufmann as her primary care provider with [facility].  This office is listed to provide the transition of care calls and follow up for their patients.  Inpatient made aware of [facility] Care Management following.  Patient for discharge home today.  For questions, please contact:  [name], RN BSN [facility] Liaison  [phone] business mobile phone Toll free office [phone]

## 2017-09-29 ENCOUNTER — Other Ambulatory Visit: Payer: Self-pay | Admitting: *Deleted

## 2017-09-29 NOTE — Patient Outreach (Signed)
[place] [facility]) Care Management  09/29/2017  [name] 1941/03/07 [id number]  Referral via [place]; recent hospital discharge 09/24/2017: Transition of care calls handled by care provider.  Telephone call to patient; person answered call & advised that patient was not available. Contact information given & request was for patient to return call.  Plan: Will follow up.  Sherrin Daisy, RN BSN [place] Management Coordinator [facility] Care Management  [phone]

## 2017-10-01 ENCOUNTER — Other Ambulatory Visit: Payer: Self-pay | Admitting: *Deleted

## 2017-10-01 NOTE — Patient Outreach (Signed)
[place] [facility]) Care Management  10/01/2017  NATHA GUIN 03-May-1941 [id number]   Referral via [place]; recent hospital discharge 09/24/2017: Transition of care calls handled by care provider.  Telephone call attempt x 2 ; left HIPPA compliant voice mail requesting return call.  Plan: Will follow up.   Sherrin Daisy, RN BSN [place] Management Coordinator [facility] Care Management  [phone]

## 2017-10-06 ENCOUNTER — Ambulatory Visit (INDEPENDENT_AMBULATORY_CARE_PROVIDER_SITE_OTHER): Payer: Medicare HMO | Admitting: Physician Assistant

## 2017-10-06 ENCOUNTER — Encounter: Payer: Self-pay | Admitting: Physician Assistant

## 2017-10-06 VITALS — BP 110/46 | HR 58 | Ht 62.0 in | Wt 219.0 lb

## 2017-10-06 DIAGNOSIS — D61818 Other pancytopenia: Secondary | ICD-10-CM | POA: Diagnosis not present

## 2017-10-06 DIAGNOSIS — N184 Chronic kidney disease, stage 4 (severe): Secondary | ICD-10-CM

## 2017-10-06 DIAGNOSIS — E785 Hyperlipidemia, unspecified: Secondary | ICD-10-CM | POA: Diagnosis not present

## 2017-10-06 DIAGNOSIS — I10 Essential (primary) hypertension: Secondary | ICD-10-CM | POA: Diagnosis not present

## 2017-10-06 DIAGNOSIS — I5042 Chronic combined systolic (congestive) and diastolic (congestive) heart failure: Secondary | ICD-10-CM | POA: Diagnosis not present

## 2017-10-06 DIAGNOSIS — I25119 Atherosclerotic heart disease of native coronary artery with unspecified angina pectoris: Secondary | ICD-10-CM | POA: Diagnosis not present

## 2017-10-06 MED ORDER — MOMETASONE FURO-FORMOTEROL FUM 200-5 MCG/ACT IN AERO: 2.0000 | INHALATION_SPRAY | Freq: Two times a day (BID) | RESPIRATORY_TRACT | 3 refills | Status: DC

## 2017-10-06 NOTE — Progress Notes (Signed)
Cardiology Office Note:    Date:  10/06/2017   ID:  MADELIN [name], DOB Sep 17, 1941, MRN [id number]  PCP:  Marletta Lor, MD  Cardiologist:  Dr. Liam Rogers   Oncologist: Dr. Jerrye Noble [facility]); Dr. [name] ([facility]) Pulmonologist: Dr. Halford Chessman  Referring MD: Marletta Lor, MD   Chief Complaint  Patient presents with  . Hospitalization Follow-up    CAD status post PCI    History of Present Illness:    [name] is a 76 y.o. female with a hx of acute myelogenous leukemia in remission since 2015 (treated with doxorubicin), stage III-IV chronic kidney disease, chronic anemia, moderate COPD, prior history of atrial fibrillation (anticoagulation deferred in 2015 due to bleeding risk).  Cardiac catheterization in 2002 demonstrated single-vessel CAD.    She was admitted in September 2018 with non-ST elevation myocardial infarction in the setting of acute combined systolic and diastolic heart failure.   Echocardiogram demonstrated reduced LV function with an EF of 40-45% and apical hypokinesis/akinesis.  Wall motion abnormalities suggested mid to distal LAD stenosis.  Initially, the patient was treated medically at an attempt to avoid cardiac catheterization due to her high risk for contrast-induced nephropathy.    When she was seen in follow-up on 09/16/17, she continued to complain of dyspnea with exertion and occasional chest tightness.  Therefore, decision was made to pursue cardiac catheterization.  This demonstrated three-vessel CAD.  She was seen by Dr. Servando Snare for surgical consultation.  She was felt to be high risk for coronary artery bypass grafting.  PCI of the RCA was recommended.  She underwent drug-eluting stent placement to the proximal-mid RCA 09/21/17.  Creatinine remained stable.  Indefinite dual antiplatelet therapy was recommended.  Intervention of the LAD could be considered if she has refractory angina.    She was admitted again 10/17-10/18 with chest pain.   Initially it was felt that she would need repeat catheterization with possible intervention of the LAD.  However, after review of her films and discussion with the interventional team, she was deemed a poor candidate for LAD intervention given diffuse disease throughout the entire vessel.  Of note, her hemoglobin was decreased.  Follow-up with her oncologist was recommended.    [name] returns for posthospitalization follow-up.  She is here with her husband.  Since discharge, she has been doing well.  She feels that her breathing is improved.  She is able to get around better at work.  She has not had chest discomfort.  She denies paroxysmal nocturnal dyspnea, syncope.  Her lower extremity edema is improved.  Of note, her Symbicort was changed over to [facility] in the hospital.  She feels better on this medication.  She would like it refilled.  Prior CV studies:   The following studies were reviewed today:  Cardiac catheterization/PCI 09/21/17 2.5 x 23 mm Sierra DES to the proximal-mid RCA  R/L cardiac catheterization 09/17/17 LM ostial 40, 50 LAD proximal 60, mid 50 LCx mid 40 RCA proximal 80 LVEDP 20, mean RA 10, RV 40/12, PA 40/15 (mean 23), PCWP 20  Echocardiogram 08/24/17 Moderate concentric LVH, EF 40-45, mid apical hypokinesis, apical akinesis, trivial MR/TR, PASP 51  Echocardiogram 08/27/16 EF 88-32, grade 1 diastolic dysfunction, mild aortic stenosis (mean 13), normal RV SF  Carotid US 05/24/14 Bilateral ICA 1-39  Nuclear stress test 02/05/14 IMPRESSION: 1. No scintigraphic evidence of prior infarction or pharmacologically induced ischemia. 2. Normal wall motion.  Ejection fraction is 63%.  Past Medical History:  Diagnosis Date  . acute myeloblastic leukemia (AML) dx'd 06/2014  . Acute pancreatitis   . Arthritis    "back" (09/17/2017)  . Atrial flutter ([place])   . Cardiomyopathy ([place])   . Chronic kidney disease, stage II (mild)   . Chronic lower back pain   . Colon polyps  04/29/2010   TUBULAR ADENOMA AND A SERRATED ADENOMA  . COPD (chronic obstructive pulmonary disease) ([place])   . CORONARY ARTERY DISEASE 12/24/2007  . DIABETES MELLITUS, TYPE II 07/13/2007  . History of blood transfusion 2015   "related to leukemia"  . History of kidney stones 12/24/2007  . HYPERLIPIDEMIA 12/24/2007  . HYPERTENSION 07/13/2007  . NSTEMI (non-ST elevated myocardial infarction) ([place]) 09/17/2017  . Unspecified disease of pancreas   . Uterine cancer [facility])     Past Surgical History:  Procedure Laterality Date  . ABDOMINAL HYSTERECTOMY     "still have my ovaries"  . CARDIAC CATHETERIZATION  2002  . CARDIAC CATHETERIZATION  09/17/2017  . CATARACT EXTRACTION W/ INTRAOCULAR LENS  IMPLANT, BILATERAL Bilateral   . COLONOSCOPY W/ BIOPSIES AND POLYPECTOMY  2011  . CORONARY STENT INTERVENTION N/A 09/21/2017   Procedure: CORONARY STENT INTERVENTION;  Surgeon: Troy Sine, MD;  Location: [place] [facility];  Service: Cardiovascular;  Laterality: N/A;  . DILATION AND CURETTAGE OF UTERUS    . EXCISIONAL HEMORRHOIDECTOMY  X 2  . GANGLION CYST EXCISION Left X 3  . INTRAVASCULAR PRESSURE WIRE/FFR STUDY N/A 09/17/2017   Procedure: INTRAVASCULAR PRESSURE WIRE/FFR STUDY;  Surgeon: Nelva Bush, MD;  Location: [place] [facility];  Service: Cardiovascular;  Laterality: N/A;  . NASAL SINUS SURGERY    . PORTA CATH INSERTION Right 2013  . RIGHT/LEFT HEART CATH AND CORONARY ANGIOGRAPHY N/A 09/17/2017   Procedure: RIGHT/LEFT HEART CATH AND CORONARY ANGIOGRAPHY;  Surgeon: Nelva Bush, MD;  Location: [place] [facility];  Service: Cardiovascular;  Laterality: N/A;  . TUBAL LIGATION      Current Medications: Current Meds  Medication Sig  . amLODipine (NORVASC) 5 MG tablet Take 1 tablet (5 mg total) by mouth daily.  Marland Kitchen aspirin 81 MG chewable tablet Chew 1 tablet (81 mg total) by mouth daily.  Marland Kitchen atorvastatin (LIPITOR) 40 MG tablet Take 1 tablet (40 mg total) by mouth daily.  . clopidogrel  (PLAVIX) 75 MG tablet Take 1 tablet (75 mg total) by mouth daily with breakfast.  . furosemide (LASIX) 40 MG tablet Take 1 tablet (40 mg total) by mouth daily.  Marland Kitchen HYDROcodone-acetaminophen (NORCO/VICODIN) 5-325 MG tablet Take 1 tablet by mouth every 6 (six) hours as needed for moderate pain.  . Insulin Glargine (TOUJEO SOLOSTAR) 300 UNIT/ML SOPN Inject 24 Units into the skin at bedtime.  . insulin lispro (HUMALOG) 100 UNIT/ML KiwkPen Inject 6-10 Units into the skin 3 (three) times daily.  . isosorbide mononitrate (IMDUR) 30 MG 24 hr tablet Take 1 tablet (30 mg total) by mouth daily.  Marland Kitchen LORazepam (ATIVAN) 0.5 MG tablet Take 1 tablet (0.5 mg total) by mouth every 6 (six) hours as needed for anxiety.  . magnesium oxide (MAG-OX) 400 (241.3 Mg) MG tablet Take 2 tablets (800 mg total) by mouth 2 (two) times daily.  . metoprolol succinate (TOPROL XL) 100 MG 24 hr tablet Take 1 tablet (100 mg total) by mouth daily. Take with or immediately following a meal.  . nitroGLYCERIN (NITROSTAT) 0.4 MG SL tablet Place 1 tablet (0.4 mg total) under the tongue every 5 (five) minutes as needed for chest pain.  Marland Kitchen  polyethylene glycol powder (GLYCOLAX/MIRALAX) powder Take 17 grams by mouth in 8 ounces of water one to two times daily for constipation.  . promethazine (PHENERGAN) 12.5 MG tablet Take 2 tablets (25 mg total) by mouth every 6 (six) hours as needed for nausea.     Allergies:   Patient has no known allergies.   Social History  Substance Use Topics  . Smoking status: Former Smoker    Packs/day: 1.00    Years: 20.00    Types: Cigarettes    Quit date: 12/08/2000  . Smokeless tobacco: Never Used  . Alcohol use No     Family Hx: The patient's family history includes COPD in her father; Cancer in her brother, father, and sister; Congestive Heart Failure in her mother; Heart attack in her maternal grandmother. There is no history of Colon cancer.  ROS:   Please see the history of present illness.    ROS All  other systems reviewed and are negative.   EKGs/Labs/Other Test Reviewed:    EKG:  EKG is  ordered today.  The ekg ordered today demonstrates sinus bradycardia, rightward axis, first-degree AV block, PR interval 272 ms, nonspecific ST-T wave changes -of note previous diffuse inferior and anterolateral T wave inversions have resolved  Recent Labs: 12/05/2016: Magnesium 2.0 08/24/2017: B Natriuretic Peptide 357.9; TSH 6.197 09/23/2017: ALT 20 09/24/2017: BUN 14; Creatinine, Ser 1.38; Hemoglobin 7.9; Platelets 128; Potassium 4.3; Sodium 140   Recent Lipid Panel Lab Results  Component Value Date/Time   CHOL 117 09/20/2017 03:06 AM   TRIG 224 (H) 09/20/2017 03:06 AM   HDL 28 (L) 09/20/2017 03:06 AM   CHOLHDL 4.2 09/20/2017 03:06 AM   LDLCALC 44 09/20/2017 03:06 AM    Physical Exam:    VS:  BP (!) 110/46   Pulse (!) 58   Ht 5\' 2"  (1.575 m)   Wt 219 lb (99.3 kg)   BMI 40.06 kg/m     Wt Readings from Last 3 Encounters:  10/06/17 219 lb (99.3 kg)  09/24/17 235 lb 12.8 oz (107 kg)  09/22/17 235 lb 7.2 oz (106.8 kg)     Physical Exam  Constitutional: She is oriented to person, place, and time. She appears well-developed and well-nourished. No distress.  HENT:  Head: Normocephalic and atraumatic.  Neck: No JVD (No JVD at 90 degrees) present.  Cardiovascular: Normal rate and regular rhythm.   Pulmonary/Chest: Effort normal. She has no wheezes. She has no rales.  Abdominal: Soft.  Musculoskeletal: She exhibits no edema or deformity (Right wrist without hematoma).  Neurological: She is alert and oriented to person, place, and time.  Skin: Skin is warm and dry.    ASSESSMENT:    1. Coronary artery disease involving native coronary artery of native heart with angina pectoris ([place])   2. Chronic combined systolic and diastolic heart failure ([place])   3. Essential hypertension   4. Hyperlipidemia, unspecified hyperlipidemia type   5. CKD (chronic kidney disease) stage 4, GFR 15-29  ml/min (HCC)   6. Other pancytopenia (HCC)    PLAN:    In order of problems listed above:  1.  Coronary artery disease involving native coronary artery of native heart with angina pectoris [facility]) History of non-ST elevation myocardial infarction in September 2018.  The cardiac catheterization demonstrated three-vessel CAD but she was not a good candidate for coronary artery bypass grafting.  She is now status post drug-eluting stent to the RCA.  She has diffuse LAD disease.  Initially, staged PCI  of the LAD was considered.  However, after consultation with the interventional team, it is felt that the LAD is not amenable to PCI.  She is doing well on current medical therapy which includes amlodipine, aspirin, Plavix, statin, nitrates, beta-blocker.  2.  Chronic combined systolic and diastolic heart failure (HCC) EF 40-45.  New York Heart Association class IIb-III.  Volume is currently stable.  Continue current medical program which includes beta-blocker, nitrates.  If her renal function remains stable, we should consider the addition of ARB for heart failure and renal protection.  3.  Essential hypertension The patient's blood pressure is controlled on her current regimen.  Continue current therapy.   4.  Hyperlipidemia, unspecified hyperlipidemia type She is now on Lipitor 40 mg daily.  Arrange lipids and LFTs in 6-8 weeks.  5.  CKD (chronic kidney disease) stage 4, GFR 15-29 ml/min (HCC) Obtain follow-up basic metabolic panel today.  6.  Other pancytopenia [facility]) She has follow-up with oncology in several weeks at [facility].  Hemoglobin was 7.9 in the hospital.  Obtain repeat CBC today.  If it continues to decrease, she will need earlier follow-up with oncology.   Dispo:  Return in about 6 weeks (around 11/17/2017) for Routine Follow Up, w/ Dr. Acie Fredrickson.   Medication Adjustments/Labs and Tests Ordered: Current medicines are reviewed at length with the patient today.  Concerns regarding  medicines are outlined above.  Tests Ordered: Orders Placed This Encounter  Procedures  . Basic Metabolic Panel (BMET)  . CBC  . Lipid Profile  . Hepatic function panel  . EKG 12-Lead   Medication Changes: Meds ordered this encounter  Medications  . mometasone-formoterol (DULERA) 200-5 MCG/ACT AERO    Sig: Inhale 2 puffs into the lungs 2 (two) times daily.    Dispense:  1 Inhaler    Refill:  3    FURTHER REFILLS NEED TO BE DONE WITH PRIMARY CARE    Signed, Richardson Dopp, PA-C  10/06/2017 5:19 PM    [facility] [place], [place], [place]  [address] Phone: [phone]; Fax: [phone]

## 2017-10-06 NOTE — Patient Instructions (Signed)
Medication Instructions:  1. A REFILL WAS SENT IN FOR DULERA ; YOU HAVE BEEN GIVEN 3 REFILLS AFTER THE 3 REFILLS YOU WILL NEED PRIMARY CARE TO REFILL   Labwork: 1. TODAY BMET, CBC  2. 11/23/17 FASTING LIPID AND LIVER PANEL TO BE DONE; NOTHING TO EAT OR DRINK AFTER MIDNIGHT THE NIGHT BEFORE LAB WORK  Testing/Procedures: NONE ORDERED TODAY  Follow-Up: 6-8 WEEKS WITH DR. Acie Fredrickson   Any Other Special Instructions Will Be Listed Below (If Applicable).     If you need a refill on your cardiac medications before your next appointment, please call your pharmacy.

## 2017-10-07 ENCOUNTER — Telehealth: Payer: Self-pay

## 2017-10-07 ENCOUNTER — Encounter: Payer: Self-pay | Admitting: *Deleted

## 2017-10-07 ENCOUNTER — Other Ambulatory Visit: Payer: Self-pay | Admitting: *Deleted

## 2017-10-07 DIAGNOSIS — I5042 Chronic combined systolic (congestive) and diastolic (congestive) heart failure: Secondary | ICD-10-CM

## 2017-10-07 LAB — CBC
HEMATOCRIT: 35.6 % (ref 34.0–46.6)
Hemoglobin: 11.2 g/dL (ref 11.1–15.9)
MCH: 29.4 pg (ref 26.6–33.0)
MCHC: 31.5 g/dL (ref 31.5–35.7)
MCV: 93 fL (ref 79–97)
Platelets: 213 10*3/uL (ref 150–379)
RBC: 3.81 x10E6/uL (ref 3.77–5.28)
RDW: 15.1 % (ref 12.3–15.4)
WBC: 9.8 10*3/uL (ref 3.4–10.8)

## 2017-10-07 LAB — BASIC METABOLIC PANEL
BUN / CREAT RATIO: 20 (ref 12–28)
BUN: 31 mg/dL — AB (ref 8–27)
CO2: 24 mmol/L (ref 20–29)
CREATININE: 1.53 mg/dL — AB (ref 0.57–1.00)
Calcium: 9.4 mg/dL (ref 8.7–10.3)
Chloride: 103 mmol/L (ref 96–106)
GFR calc non Af Amer: 33 mL/min/{1.73_m2} — ABNORMAL LOW (ref >59)
GFR, EST AFRICAN AMERICAN: 38 mL/min/{1.73_m2} — AB (ref >59)
Glucose: 93 mg/dL (ref 65–99)
Potassium: 4.8 mmol/L (ref 3.5–5.2)
SODIUM: 140 mmol/L (ref 134–144)

## 2017-10-07 NOTE — Telephone Encounter (Signed)
-----   Message from Liliane Shi, [place] sent at 10/07/2017  3:37 PM EDT ----- Please call the patient Renal function stable.  Potassium normal.  Hemoglobin improved - now normal. Continue current medications and follow up as planned.  Repeat BMET 1 month. Richardson Dopp, PA-C 10/07/2017 3:37 PM

## 2017-10-07 NOTE — Telephone Encounter (Signed)
Spoke with patient and gave results of recent lab results with verbal understanding. Reminded patient of her upcoming appt with Dr. Acie Fredrickson on Dec 11 at 8:40 with FASTING labs, BMET. Patient verbalized understanding.

## 2017-10-07 NOTE — Patient Outreach (Signed)
[place] [facility] [place] [facility]) Care Management  10/07/2017  [name] 07-31-1941 [id number]  Telephone call to patient who was advised of reason for call.  Patient voices that she is doing okay now & had recent MD follow up this week.  States heart failure is under control. States she is weighing daily & knows when she needs to call MD office as it relates to weight gain.  States she has started back to work & would not be available to participate in [employer].  States she will accept [facility] contact information to use if needed.   Plan: Close case/send to care management assistant.   Sherrin Daisy, RN BSN [place] Management Coordinator [facility] Care Management  [phone]

## 2017-10-08 ENCOUNTER — Other Ambulatory Visit: Payer: Self-pay | Admitting: Internal Medicine

## 2017-10-09 ENCOUNTER — Ambulatory Visit (INDEPENDENT_AMBULATORY_CARE_PROVIDER_SITE_OTHER): Payer: Medicare HMO

## 2017-10-09 DIAGNOSIS — E538 Deficiency of other specified B group vitamins: Secondary | ICD-10-CM

## 2017-10-09 MED ORDER — METOPROLOL SUCCINATE ER 100 MG PO TB24: 100.0000 mg | ORAL_TABLET | Freq: Every day | ORAL | 3 refills | Status: DC

## 2017-10-09 MED ORDER — CLOPIDOGREL BISULFATE 75 MG PO TABS: 75.0000 mg | ORAL_TABLET | Freq: Every day | ORAL | 3 refills | Status: DC

## 2017-10-09 MED ORDER — CYANOCOBALAMIN 1000 MCG/ML IJ SOLN
1000.0000 ug | Freq: Once | INTRAMUSCULAR | Status: AC
  Administered 2017-10-09: 1000 ug via INTRAMUSCULAR

## 2017-10-09 MED ORDER — POLYETHYLENE GLYCOL 3350 17 GM/SCOOP PO POWD: ORAL | 5 refills | Status: DC

## 2017-10-09 MED ORDER — MAGNESIUM OXIDE 400 (241.3 MG) MG PO TABS: 800.0000 mg | ORAL_TABLET | Freq: Two times a day (BID) | ORAL | 3 refills | Status: DC

## 2017-10-09 MED ORDER — ATORVASTATIN CALCIUM 40 MG PO TABS: 40.0000 mg | ORAL_TABLET | Freq: Every day | ORAL | 3 refills | Status: DC

## 2017-10-09 MED ORDER — ISOSORBIDE MONONITRATE ER 30 MG PO TB24: 30.0000 mg | ORAL_TABLET | Freq: Every day | ORAL | 3 refills | Status: DC

## 2017-10-09 MED ORDER — MOMETASONE FURO-FORMOTEROL FUM 200-5 MCG/ACT IN AERO: 2.0000 | INHALATION_SPRAY | Freq: Two times a day (BID) | RESPIRATORY_TRACT | 6 refills | Status: DC

## 2017-10-12 ENCOUNTER — Telehealth: Payer: Self-pay | Admitting: Internal Medicine

## 2017-10-12 ENCOUNTER — Encounter: Payer: Self-pay | Admitting: Internal Medicine

## 2017-10-12 ENCOUNTER — Ambulatory Visit (INDEPENDENT_AMBULATORY_CARE_PROVIDER_SITE_OTHER): Payer: Medicare HMO | Admitting: Internal Medicine

## 2017-10-12 VITALS — BP 138/68 | Temp 98.2°F | Ht 62.0 in | Wt 217.8 lb

## 2017-10-12 DIAGNOSIS — G8929 Other chronic pain: Secondary | ICD-10-CM | POA: Diagnosis not present

## 2017-10-12 DIAGNOSIS — M549 Dorsalgia, unspecified: Secondary | ICD-10-CM | POA: Diagnosis not present

## 2017-10-12 DIAGNOSIS — R52 Pain, unspecified: Secondary | ICD-10-CM

## 2017-10-12 MED ORDER — HYDROCODONE-ACETAMINOPHEN 5-325 MG PO TABS: 1.0000 | ORAL_TABLET | Freq: Four times a day (QID) | ORAL | 0 refills | Status: DC | PRN

## 2017-10-12 MED ORDER — INSULIN GLARGINE 300 UNIT/ML ~~LOC~~ SOPN: 40.0000 [IU] | PEN_INJECTOR | Freq: Every day | SUBCUTANEOUS | 6 refills | Status: DC

## 2017-10-12 NOTE — Telephone Encounter (Signed)
Please advise 

## 2017-10-12 NOTE — Telephone Encounter (Signed)
Sorry, no longer accepting new patients

## 2017-10-12 NOTE — Progress Notes (Signed)
   Subjective:    Patient ID: [name], female    DOB: 12/26/1940, 76 y.o.   MRN: [id number]  HPI  [place] printed, initialed  and scanned into the EMR.  Indication for chronic opioid: Patient has a history of chronic back pain secondary to advanced osteoarthritis.  On most days she takes 4 hydrocodone tablets daily Medication and dose: Hydrocodone 5-acetaminophen 325 mg every 6 hours as needed for pain # pills per month: 120 maximum monthly dose  Last UDS date: October 12, 2017 Pain contract signed (Y/N): Yes Date narcotic database last reviewed (include red flags): October 12, 2017  Review of Systems     Objective:   Physical Exam        Assessment & Plan:    Encounter for chronic pain management (G89.29) Narcotic use  (711.90) Pain management contract signed (Z02.89)  [name]

## 2017-10-12 NOTE — Patient Instructions (Signed)
Return as scheduled for general follow-up

## 2017-10-12 NOTE — Telephone Encounter (Signed)
Pt husband would like to est with dr Raliegh Ip. Can I sch?

## 2017-10-12 NOTE — Telephone Encounter (Signed)
Pt has OV today

## 2017-10-15 ENCOUNTER — Other Ambulatory Visit: Payer: Self-pay | Admitting: Nurse Practitioner

## 2017-10-17 ENCOUNTER — Other Ambulatory Visit: Payer: Self-pay | Admitting: Nurse Practitioner

## 2017-10-21 ENCOUNTER — Telehealth: Payer: Self-pay | Admitting: Internal Medicine

## 2017-10-21 NOTE — Telephone Encounter (Signed)
Received prior authorization for [facility]. Prior authorization has been submitted via covermymeds. [id number]

## 2017-10-22 ENCOUNTER — Ambulatory Visit: Payer: Medicare HMO | Admitting: Internal Medicine

## 2017-10-22 NOTE — Telephone Encounter (Signed)
[employer] [facility] would like to know why the formulary alternatives would not be appropriate for this patient please call [phone] or fax form back

## 2017-10-23 DIAGNOSIS — C9201 Acute myeloblastic leukemia, in remission: Secondary | ICD-10-CM | POA: Diagnosis not present

## 2017-10-23 DIAGNOSIS — I10 Essential (primary) hypertension: Secondary | ICD-10-CM | POA: Diagnosis not present

## 2017-10-23 DIAGNOSIS — I129 Hypertensive chronic kidney disease with stage 1 through stage 4 chronic kidney disease, or unspecified chronic kidney disease: Secondary | ICD-10-CM | POA: Diagnosis not present

## 2017-10-23 DIAGNOSIS — N183 Chronic kidney disease, stage 3 (moderate): Secondary | ICD-10-CM | POA: Diagnosis not present

## 2017-10-23 DIAGNOSIS — E119 Type 2 diabetes mellitus without complications: Secondary | ICD-10-CM | POA: Diagnosis not present

## 2017-10-23 DIAGNOSIS — D696 Thrombocytopenia, unspecified: Secondary | ICD-10-CM | POA: Diagnosis not present

## 2017-10-23 DIAGNOSIS — Z9221 Personal history of antineoplastic chemotherapy: Secondary | ICD-10-CM | POA: Diagnosis not present

## 2017-10-23 DIAGNOSIS — E538 Deficiency of other specified B group vitamins: Secondary | ICD-10-CM | POA: Diagnosis not present

## 2017-10-23 DIAGNOSIS — Z95828 Presence of other vascular implants and grafts: Secondary | ICD-10-CM | POA: Diagnosis not present

## 2017-10-23 DIAGNOSIS — I480 Paroxysmal atrial fibrillation: Secondary | ICD-10-CM | POA: Diagnosis not present

## 2017-10-23 DIAGNOSIS — M25471 Effusion, right ankle: Secondary | ICD-10-CM | POA: Diagnosis not present

## 2017-10-23 DIAGNOSIS — K5900 Constipation, unspecified: Secondary | ICD-10-CM | POA: Diagnosis not present

## 2017-10-23 LAB — HEMOGLOBIN A1C: Hemoglobin A1C: 9.8

## 2017-10-26 ENCOUNTER — Ambulatory Visit: Payer: Medicare HMO | Admitting: Internal Medicine

## 2017-10-26 ENCOUNTER — Encounter: Payer: Self-pay | Admitting: Internal Medicine

## 2017-10-26 VITALS — BP 120/64 | HR 74 | Temp 98.6°F | Wt 222.6 lb

## 2017-10-26 DIAGNOSIS — I2510 Atherosclerotic heart disease of native coronary artery without angina pectoris: Secondary | ICD-10-CM

## 2017-10-26 DIAGNOSIS — E1122 Type 2 diabetes mellitus with diabetic chronic kidney disease: Secondary | ICD-10-CM | POA: Diagnosis not present

## 2017-10-26 DIAGNOSIS — Z794 Long term (current) use of insulin: Secondary | ICD-10-CM

## 2017-10-26 DIAGNOSIS — N189 Chronic kidney disease, unspecified: Secondary | ICD-10-CM | POA: Diagnosis not present

## 2017-10-26 DIAGNOSIS — E0821 Diabetes mellitus due to underlying condition with diabetic nephropathy: Secondary | ICD-10-CM

## 2017-10-26 MED ORDER — LORAZEPAM 0.5 MG PO TABS: 0.5000 mg | ORAL_TABLET | Freq: Four times a day (QID) | ORAL | 5 refills | Status: DC | PRN

## 2017-10-26 MED ORDER — INSULIN GLARGINE 300 UNIT/ML ~~LOC~~ SOPN: 50.0000 [IU] | PEN_INJECTOR | Freq: Every day | SUBCUTANEOUS | 6 refills | Status: DC

## 2017-10-26 MED ORDER — INSULIN LISPRO 100 UNIT/ML (KWIKPEN): PEN_INJECTOR | SUBCUTANEOUS | 11 refills | Status: DC

## 2017-10-26 NOTE — Patient Instructions (Signed)
Increase bedtime insulin to 50 units  Increase mealtime units as directed  Return in 4 weeks for follow-up

## 2017-10-26 NOTE — Progress Notes (Signed)
Subjective:    Patient ID: [name], female    DOB: 01/28/1941, 76 y.o.   MRN: [id number]  HPI 76 year old patient who is seen today for follow-up of poorly controlled diabetes. Due to chronic renal and cardiac disease oral medications have been discontinued.  Presently she is on basal bolus insulin only last week hemoglobin A1c had increased to 9.8.  On October 14 hemoglobin A1c was 7.7.  The patient has increased basal insulin to 45 units and has increased her mealtime insulin to 10 units 3 times daily.  Blood sugars remain consistently greater than 300.  She generally feels well   Past Medical History:  Diagnosis Date  . acute myeloblastic leukemia (AML) dx'd 06/2014  . Acute pancreatitis   . Arthritis    "back" (09/17/2017)  . Atrial flutter ([place])   . Cardiomyopathy ([place])   . Chronic kidney disease, stage II (mild)   . Chronic lower back pain   . Colon polyps 04/29/2010   TUBULAR ADENOMA AND A SERRATED ADENOMA  . COPD (chronic obstructive pulmonary disease) ([place])   . CORONARY ARTERY DISEASE 12/24/2007  . DIABETES MELLITUS, TYPE II 07/13/2007  . History of blood transfusion 2015   "related to leukemia"  . History of kidney stones 12/24/2007  . HYPERLIPIDEMIA 12/24/2007  . HYPERTENSION 07/13/2007  . NSTEMI (non-ST elevated myocardial infarction) ([place]) 09/17/2017  . Unspecified disease of pancreas   . Uterine cancer [facility])      Social History   Socioeconomic History  . Marital status: Married    Spouse name: Not on file  . Number of children: Not on file  . Years of education: Not on file  . Highest education level: Not on file  Social Needs  . Financial resource strain: Not on file  . Food insecurity - worry: Not on file  . Food insecurity - inability: Not on file  . Transportation needs - medical: Not on file  . Transportation needs - non-medical: Not on file  Occupational History  . Occupation: works in a lab    Comment: makes glasses  Tobacco Use  . Smoking  status: Former Smoker    Packs/day: 1.00    Years: 20.00    Pack years: 20.00    Types: Cigarettes    Last attempt to quit: 12/08/2000    Years since quitting: 16.8  . Smokeless tobacco: Never Used  Substance and Sexual Activity  . Alcohol use: No    Alcohol/week: 0.0 oz  . Drug use: No  . Sexual activity: No  Other Topics Concern  . 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 ABDOMINAL HYSTERECTOMY     "still have my ovaries"  . CARDIAC CATHETERIZATION  2002  . CARDIAC CATHETERIZATION  09/17/2017  . CATARACT EXTRACTION W/ INTRAOCULAR LENS  IMPLANT, BILATERAL Bilateral   . COLONOSCOPY W/ BIOPSIES AND POLYPECTOMY  2011  . CORONARY STENT INTERVENTION N/A 09/21/2017   Performed by 10/04/2017, MD at [place] [facility]  . DILATION AND CURETTAGE OF UTERUS    . EXCISIONAL HEMORRHOIDECTOMY  X 2  . GANGLION CYST EXCISION Left X 3  . INTRAVASCULAR PRESSURE WIRE/FFR STUDY N/A 09/17/2017   Performed by 23/10/2017, MD at [place] [facility]  . NASAL SINUS SURGERY    . PORTA CATH INSERTION Right 2013  . RIGHT/LEFT HEART CATH AND CORONARY ANGIOGRAPHY N/A  09/17/2017   Performed by Nelva Bush, MD at [place] [facility]  . TUBAL LIGATION      Family History  Problem Relation Age of Onset  . Cancer Father        throat ca  . COPD Father   . Cancer Sister        uterine ca and breast ca  . Cancer Brother        throat  . Congestive Heart Failure Mother   . Heart attack Maternal Grandmother   . Colon cancer Neg Hx     No Known Allergies  Current Outpatient Medications on File Prior to Visit  Medication Sig Dispense Refill  . amLODipine (NORVASC) 5 MG tablet TAKE 1 TABLET (5 MG TOTAL) BY MOUTH DAILY. 90 tablet 1  . aspirin 81 MG chewable tablet Chew 1 tablet (81 mg total) by mouth daily. 30 tablet 1  . atorvastatin (LIPITOR) 40 MG tablet Take 1 tablet (40 mg total)  by mouth daily. 90 tablet 3  . clopidogrel (PLAVIX) 75 MG tablet Take 1 tablet (75 mg total) by mouth daily with breakfast. 90 tablet 3  . furosemide (LASIX) 40 MG tablet Take 1 tablet (40 mg total) by mouth daily. 90 tablet 1  . HYDROcodone-acetaminophen (NORCO/VICODIN) 5-325 MG tablet Take 1 tablet every 6 (six) hours as needed by mouth for moderate pain. 120 tablet 0  . isosorbide mononitrate (IMDUR) 30 MG 24 hr tablet Take 1 tablet (30 mg total) by mouth daily. 90 tablet 3  . magnesium oxide (MAG-OX) 400 (241.3 Mg) MG tablet Take 2 tablets (800 mg total) by mouth 2 (two) times daily. 360 tablet 3  . metoprolol succinate (TOPROL XL) 100 MG 24 hr tablet Take 1 tablet (100 mg total) by mouth daily. Take with or immediately following a meal. 90 tablet 3  . mometasone-formoterol (DULERA) 200-5 MCG/ACT AERO Inhale 2 puffs into the lungs 2 (two) times daily. 2 Inhaler 6  . nitroGLYCERIN (NITROSTAT) 0.4 MG SL tablet Place 1 tablet (0.4 mg total) under the tongue every 5 (five) minutes as needed for chest pain. 24 tablet 1  . pioglitazone (ACTOS) 15 MG tablet TAKE 1 TABLET EVERY DAY 90 tablet 2  . polyethylene glycol powder (GLYCOLAX/MIRALAX) powder Take 17 grams by mouth in 8 ounces of water one to two times daily for constipation. 850 g 5  . promethazine (PHENERGAN) 12.5 MG tablet Take 2 tablets (25 mg total) by mouth every 6 (six) hours as needed for nausea. 12 tablet 0   Current Facility-Administered Medications on File Prior to Visit  Medication Dose Route Frequency Provider Last Rate Last Dose  . sodium chloride 0.9 % 1,000 mL with magnesium sulfate 2 g infusion   Intravenous Continuous Ladell Pier, MD   Stopped at 08/04/14 1441    BP 120/64 (BP Location: Left Arm, Patient Position: Sitting, Cuff Size: Large)   Pulse 74   Temp 98.6 F (37 C) (Oral)   Wt 222 lb 9.6 oz (101 kg)   BMI 40.71 kg/m      Review of Systems  Constitutional: Negative.   HENT: Negative for congestion,  dental problem, hearing loss, rhinorrhea, sinus pressure, sore throat and tinnitus.   Eyes: Negative for pain, discharge and visual disturbance.  Respiratory: Negative for cough and shortness of breath.   Cardiovascular: Negative for chest pain, palpitations and leg swelling.  Gastrointestinal: Negative for abdominal distention, abdominal pain, blood in stool, constipation, diarrhea, nausea and vomiting.  Genitourinary: Negative  for difficulty urinating, dysuria, flank pain, frequency, hematuria, pelvic pain, urgency, vaginal bleeding, vaginal discharge and vaginal pain.  Musculoskeletal: Positive for back pain. Negative for arthralgias, gait problem and joint swelling.  Skin: Negative for rash.  Neurological: Negative for dizziness, syncope, speech difficulty, weakness, numbness and headaches.  Hematological: Negative for adenopathy.  Psychiatric/Behavioral: Negative for agitation, behavioral problems and dysphoric mood. The patient is not nervous/anxious.        Objective:   Physical Exam  Constitutional: She appears well-developed and well-nourished. No distress.  Weight 222 Blood pressure well controlled pulse 70   Psychiatric: She has a normal mood and affect. Her behavior is normal.          Assessment & Plan:   Diabetes mellitus poor control.  Will increase basal insulin to 50 units and up titrate mealtime insulin.  Presently no regular home blood sugar monitoring. We will place on a sliding scale mealtime regimen. Follow-up 4 weeks  [name]

## 2017-11-02 NOTE — Telephone Encounter (Signed)
Breo Ellipta 200/25 one puff daily

## 2017-11-02 NOTE — Telephone Encounter (Signed)
The form stated Ruthe Mannan is non-formulary and the following drugs are preferred: -Breo Ellipta powder -Advair Diskus powder -Advair HFA aerosol inhaler

## 2017-11-02 NOTE — Telephone Encounter (Signed)
Please advise 

## 2017-11-04 ENCOUNTER — Other Ambulatory Visit: Payer: Self-pay | Admitting: Internal Medicine

## 2017-11-04 MED ORDER — FLUTICASONE FUROATE-VILANTEROL 200-25 MCG/INH IN AEPB: 1.0000 | INHALATION_SPRAY | Freq: Every day | RESPIRATORY_TRACT | 5 refills | Status: DC

## 2017-11-04 NOTE — Telephone Encounter (Signed)
medication was ordered threw [facility] mail order pharmacy,pt was called and a voicemail was left.

## 2017-11-06 ENCOUNTER — Telehealth: Payer: Self-pay | Admitting: Internal Medicine

## 2017-11-06 NOTE — Telephone Encounter (Signed)
Called pt left a message for pt to return my call in the office in regards to her request for Humalog  Kwik pen samples.

## 2017-11-06 NOTE — Telephone Encounter (Signed)
Copied from [place] [phone]. Topic: Quick Communication - See Telephone Encounter >> Nov 06, 2017  9:31 AM Aurelio Brash B wrote: CRM for notification. See Telephone encounter for:  PT asking if office has samples of humalog pen, would like a call back  11/06/17.

## 2017-11-09 ENCOUNTER — Ambulatory Visit (INDEPENDENT_AMBULATORY_CARE_PROVIDER_SITE_OTHER): Payer: Medicare HMO | Admitting: *Deleted

## 2017-11-09 DIAGNOSIS — E538 Deficiency of other specified B group vitamins: Secondary | ICD-10-CM

## 2017-11-09 MED ORDER — CYANOCOBALAMIN 1000 MCG/ML IJ SOLN
1000.0000 ug | Freq: Once | INTRAMUSCULAR | Status: AC
  Administered 2017-11-09: 1000 ug via INTRAMUSCULAR

## 2017-11-09 NOTE — Progress Notes (Signed)
Per orders of Dr. Burnice Logan, injection of B12 given by Dorrene German. Patient tolerated injection well.  Dorrene German, RN

## 2017-11-10 ENCOUNTER — Telehealth: Payer: Self-pay | Admitting: Oncology

## 2017-11-10 NOTE — Telephone Encounter (Signed)
Moved from 12/27 spoke w/ patient

## 2017-11-17 ENCOUNTER — Other Ambulatory Visit: Payer: Medicare HMO

## 2017-11-17 ENCOUNTER — Ambulatory Visit: Payer: Medicare HMO | Admitting: Cardiovascular Disease

## 2017-11-20 ENCOUNTER — Encounter: Payer: Self-pay | Admitting: Cardiovascular Disease

## 2017-11-21 ENCOUNTER — Emergency Department (HOSPITAL_COMMUNITY): Payer: Medicare HMO

## 2017-11-21 ENCOUNTER — Encounter (HOSPITAL_COMMUNITY): Payer: Self-pay | Admitting: Family Medicine

## 2017-11-21 ENCOUNTER — Observation Stay (HOSPITAL_COMMUNITY): Payer: Medicare HMO

## 2017-11-21 ENCOUNTER — Inpatient Hospital Stay (HOSPITAL_COMMUNITY)
Admission: EM | Admit: 2017-11-21 | Discharge: 2017-11-25 | DRG: 069 | Disposition: A | Discharge status: Home / Self Care | Payer: Medicare HMO | Attending: Internal Medicine | Admitting: Internal Medicine

## 2017-11-21 DIAGNOSIS — Z8542 Personal history of malignant neoplasm of other parts of uterus: Secondary | ICD-10-CM

## 2017-11-21 DIAGNOSIS — R569 Unspecified convulsions: Secondary | ICD-10-CM | POA: Diagnosis present

## 2017-11-21 DIAGNOSIS — I130 Hypertensive heart and chronic kidney disease with heart failure and stage 1 through stage 4 chronic kidney disease, or unspecified chronic kidney disease: Secondary | ICD-10-CM | POA: Diagnosis present

## 2017-11-21 DIAGNOSIS — IMO0001 Reserved for inherently not codable concepts without codable children: Secondary | ICD-10-CM

## 2017-11-21 DIAGNOSIS — R4781 Slurred speech: Secondary | ICD-10-CM | POA: Diagnosis not present

## 2017-11-21 DIAGNOSIS — J449 Chronic obstructive pulmonary disease, unspecified: Secondary | ICD-10-CM | POA: Diagnosis present

## 2017-11-21 DIAGNOSIS — Z961 Presence of intraocular lens: Secondary | ICD-10-CM | POA: Diagnosis present

## 2017-11-21 DIAGNOSIS — C9200 Acute myeloblastic leukemia, not having achieved remission: Secondary | ICD-10-CM | POA: Diagnosis present

## 2017-11-21 DIAGNOSIS — E1122 Type 2 diabetes mellitus with diabetic chronic kidney disease: Secondary | ICD-10-CM | POA: Diagnosis present

## 2017-11-21 DIAGNOSIS — I5042 Chronic combined systolic (congestive) and diastolic (congestive) heart failure: Secondary | ICD-10-CM | POA: Diagnosis not present

## 2017-11-21 DIAGNOSIS — E785 Hyperlipidemia, unspecified: Secondary | ICD-10-CM | POA: Diagnosis present

## 2017-11-21 DIAGNOSIS — I429 Cardiomyopathy, unspecified: Secondary | ICD-10-CM | POA: Diagnosis present

## 2017-11-21 DIAGNOSIS — E119 Type 2 diabetes mellitus without complications: Secondary | ICD-10-CM

## 2017-11-21 DIAGNOSIS — I10 Essential (primary) hypertension: Secondary | ICD-10-CM

## 2017-11-21 DIAGNOSIS — I672 Cerebral atherosclerosis: Secondary | ICD-10-CM | POA: Diagnosis present

## 2017-11-21 DIAGNOSIS — Z794 Long term (current) use of insulin: Secondary | ICD-10-CM

## 2017-11-21 DIAGNOSIS — E871 Hypo-osmolality and hyponatremia: Secondary | ICD-10-CM | POA: Diagnosis present

## 2017-11-21 DIAGNOSIS — E878 Other disorders of electrolyte and fluid balance, not elsewhere classified: Secondary | ICD-10-CM | POA: Diagnosis present

## 2017-11-21 DIAGNOSIS — R739 Hyperglycemia, unspecified: Secondary | ICD-10-CM

## 2017-11-21 DIAGNOSIS — I2510 Atherosclerotic heart disease of native coronary artery without angina pectoris: Secondary | ICD-10-CM

## 2017-11-21 DIAGNOSIS — E1165 Type 2 diabetes mellitus with hyperglycemia: Secondary | ICD-10-CM | POA: Diagnosis present

## 2017-11-21 DIAGNOSIS — Z955 Presence of coronary angioplasty implant and graft: Secondary | ICD-10-CM

## 2017-11-21 DIAGNOSIS — Z79899 Other long term (current) drug therapy: Secondary | ICD-10-CM

## 2017-11-21 DIAGNOSIS — M545 Low back pain: Secondary | ICD-10-CM | POA: Diagnosis present

## 2017-11-21 DIAGNOSIS — G459 Transient cerebral ischemic attack, unspecified: Secondary | ICD-10-CM | POA: Diagnosis present

## 2017-11-21 DIAGNOSIS — I4892 Unspecified atrial flutter: Secondary | ICD-10-CM | POA: Diagnosis present

## 2017-11-21 DIAGNOSIS — Z7902 Long term (current) use of antithrombotics/antiplatelets: Secondary | ICD-10-CM

## 2017-11-21 DIAGNOSIS — I252 Old myocardial infarction: Secondary | ICD-10-CM

## 2017-11-21 DIAGNOSIS — R29700 NIHSS score 0: Secondary | ICD-10-CM | POA: Diagnosis present

## 2017-11-21 DIAGNOSIS — G40209 Localization-related (focal) (partial) symptomatic epilepsy and epileptic syndromes with complex partial seizures, not intractable, without status epilepticus: Secondary | ICD-10-CM | POA: Diagnosis not present

## 2017-11-21 DIAGNOSIS — N184 Chronic kidney disease, stage 4 (severe): Secondary | ICD-10-CM | POA: Diagnosis present

## 2017-11-21 DIAGNOSIS — I5022 Chronic systolic (congestive) heart failure: Secondary | ICD-10-CM | POA: Diagnosis present

## 2017-11-21 DIAGNOSIS — Z8673 Personal history of transient ischemic attack (TIA), and cerebral infarction without residual deficits: Secondary | ICD-10-CM | POA: Diagnosis not present

## 2017-11-21 DIAGNOSIS — N179 Acute kidney failure, unspecified: Secondary | ICD-10-CM | POA: Diagnosis present

## 2017-11-21 DIAGNOSIS — K869 Disease of pancreas, unspecified: Secondary | ICD-10-CM | POA: Diagnosis present

## 2017-11-21 DIAGNOSIS — D696 Thrombocytopenia, unspecified: Secondary | ICD-10-CM | POA: Diagnosis present

## 2017-11-21 DIAGNOSIS — I5084 End stage heart failure: Secondary | ICD-10-CM | POA: Diagnosis present

## 2017-11-21 DIAGNOSIS — Z6841 Body Mass Index (BMI) 40.0 and over, adult: Secondary | ICD-10-CM | POA: Diagnosis not present

## 2017-11-21 DIAGNOSIS — G8929 Other chronic pain: Secondary | ICD-10-CM | POA: Diagnosis present

## 2017-11-21 DIAGNOSIS — E860 Dehydration: Secondary | ICD-10-CM | POA: Diagnosis present

## 2017-11-21 DIAGNOSIS — E669 Obesity, unspecified: Secondary | ICD-10-CM | POA: Diagnosis present

## 2017-11-21 DIAGNOSIS — Z8249 Family history of ischemic heart disease and other diseases of the circulatory system: Secondary | ICD-10-CM

## 2017-11-21 DIAGNOSIS — R253 Fasciculation: Secondary | ICD-10-CM | POA: Diagnosis present

## 2017-11-21 DIAGNOSIS — J410 Simple chronic bronchitis: Secondary | ICD-10-CM | POA: Diagnosis not present

## 2017-11-21 DIAGNOSIS — R4701 Aphasia: Secondary | ICD-10-CM | POA: Diagnosis present

## 2017-11-21 DIAGNOSIS — C9201 Acute myeloblastic leukemia, in remission: Secondary | ICD-10-CM | POA: Diagnosis not present

## 2017-11-21 DIAGNOSIS — I440 Atrioventricular block, first degree: Secondary | ICD-10-CM | POA: Diagnosis present

## 2017-11-21 DIAGNOSIS — I5043 Acute on chronic combined systolic (congestive) and diastolic (congestive) heart failure: Secondary | ICD-10-CM | POA: Diagnosis present

## 2017-11-21 DIAGNOSIS — E861 Hypovolemia: Secondary | ICD-10-CM | POA: Diagnosis present

## 2017-11-21 DIAGNOSIS — Z87891 Personal history of nicotine dependence: Secondary | ICD-10-CM

## 2017-11-21 DIAGNOSIS — Z86010 Personal history of colonic polyps: Secondary | ICD-10-CM

## 2017-11-21 DIAGNOSIS — D631 Anemia in chronic kidney disease: Secondary | ICD-10-CM | POA: Diagnosis present

## 2017-11-21 DIAGNOSIS — N183 Chronic kidney disease, stage 3 (moderate): Secondary | ICD-10-CM | POA: Diagnosis not present

## 2017-11-21 DIAGNOSIS — I480 Paroxysmal atrial fibrillation: Secondary | ICD-10-CM | POA: Diagnosis not present

## 2017-11-21 DIAGNOSIS — R9431 Abnormal electrocardiogram [ECG] [EKG]: Secondary | ICD-10-CM | POA: Diagnosis not present

## 2017-11-21 DIAGNOSIS — G40109 Localization-related (focal) (partial) symptomatic epilepsy and epileptic syndromes with simple partial seizures, not intractable, without status epilepticus: Secondary | ICD-10-CM | POA: Diagnosis not present

## 2017-11-21 DIAGNOSIS — Z7982 Long term (current) use of aspirin: Secondary | ICD-10-CM

## 2017-11-21 DIAGNOSIS — R130 Aphagia: Secondary | ICD-10-CM | POA: Diagnosis not present

## 2017-11-21 DIAGNOSIS — G40119 Localization-related (focal) (partial) symptomatic epilepsy and epileptic syndromes with simple partial seizures, intractable, without status epilepticus: Secondary | ICD-10-CM | POA: Diagnosis not present

## 2017-11-21 LAB — COMPREHENSIVE METABOLIC PANEL
ALT: 17 U/L (ref 14–54)
AST: 20 U/L (ref 15–41)
Albumin: 3.1 g/dL — ABNORMAL LOW (ref 3.5–5.0)
Alkaline Phosphatase: 159 U/L — ABNORMAL HIGH (ref 38–126)
Anion gap: 11 (ref 5–15)
BUN: 27 mg/dL — ABNORMAL HIGH (ref 6–20)
CO2: 22 mmol/L (ref 22–32)
Calcium: 8.3 mg/dL — ABNORMAL LOW (ref 8.9–10.3)
Chloride: 96 mmol/L — ABNORMAL LOW (ref 101–111)
Creatinine, Ser: 2.16 mg/dL — ABNORMAL HIGH (ref 0.44–1.00)
GFR calc Af Amer: 25 mL/min — ABNORMAL LOW (ref >60)
GFR calc non Af Amer: 21 mL/min — ABNORMAL LOW (ref >60)
Glucose, Bld: 478 mg/dL — ABNORMAL HIGH (ref 65–99)
Potassium: 4 mmol/L (ref 3.5–5.1)
Sodium: 129 mmol/L — ABNORMAL LOW (ref 135–145)
Total Bilirubin: 0.7 mg/dL (ref 0.3–1.2)
Total Protein: 5.8 g/dL — ABNORMAL LOW (ref 6.5–8.1)

## 2017-11-21 LAB — DIFFERENTIAL
Basophils Absolute: 0 10*3/uL (ref 0.0–0.1)
Basophils Relative: 0 %
Eosinophils Absolute: 0.1 10*3/uL (ref 0.0–0.7)
Eosinophils Relative: 2 %
Lymphocytes Relative: 22 %
Lymphs Abs: 1.5 10*3/uL (ref 0.7–4.0)
Monocytes Absolute: 0.5 10*3/uL (ref 0.1–1.0)
Monocytes Relative: 7 %
Neutro Abs: 5 10*3/uL (ref 1.7–7.7)
Neutrophils Relative %: 69 %

## 2017-11-21 LAB — I-STAT CHEM 8, ED
BUN: 26 mg/dL — ABNORMAL HIGH (ref 6–20)
Calcium, Ion: 1.12 mmol/L — ABNORMAL LOW (ref 1.15–1.40)
Chloride: 95 mmol/L — ABNORMAL LOW (ref 101–111)
Creatinine, Ser: 2 mg/dL — ABNORMAL HIGH (ref 0.44–1.00)
Glucose, Bld: 474 mg/dL — ABNORMAL HIGH (ref 65–99)
HCT: 35 % — ABNORMAL LOW (ref 36.0–46.0)
Hemoglobin: 11.9 g/dL — ABNORMAL LOW (ref 12.0–15.0)
Potassium: 4.1 mmol/L (ref 3.5–5.1)
Sodium: 133 mmol/L — ABNORMAL LOW (ref 135–145)
TCO2: 24 mmol/L (ref 22–32)

## 2017-11-21 LAB — CBC
HCT: 35 % — ABNORMAL LOW (ref 36.0–46.0)
Hemoglobin: 11.6 g/dL — ABNORMAL LOW (ref 12.0–15.0)
MCH: 30.1 pg (ref 26.0–34.0)
MCHC: 33.1 g/dL (ref 30.0–36.0)
MCV: 90.9 fL (ref 78.0–100.0)
Platelets: 145 10*3/uL — ABNORMAL LOW (ref 150–400)
RBC: 3.85 MIL/uL — ABNORMAL LOW (ref 3.87–5.11)
RDW: 13.7 % (ref 11.5–15.5)
WBC: 7.2 10*3/uL (ref 4.0–10.5)

## 2017-11-21 LAB — PROTIME-INR
INR: 1.02
Prothrombin Time: 13.3 seconds (ref 11.4–15.2)

## 2017-11-21 LAB — I-STAT TROPONIN, ED: Troponin i, poc: 0.01 ng/mL (ref 0.00–0.08)

## 2017-11-21 LAB — APTT: aPTT: 27 seconds (ref 24–36)

## 2017-11-21 MED ORDER — ACETAMINOPHEN 650 MG RE SUPP: 650.0000 mg | RECTAL | Status: DC | PRN

## 2017-11-21 MED ORDER — STROKE: EARLY STAGES OF RECOVERY BOOK
Freq: Once | Status: AC
  Administered 2017-11-22: 01:00:00
  Filled 2017-11-21: qty 1

## 2017-11-21 MED ORDER — CLOPIDOGREL BISULFATE 75 MG PO TABS
75.0000 mg | ORAL_TABLET | Freq: Every day | ORAL | Status: DC
  Administered 2017-11-22 – 2017-11-25 (×4): 75 mg via ORAL
  Filled 2017-11-21 (×4): qty 1

## 2017-11-21 MED ORDER — MOMETASONE FURO-FORMOTEROL FUM 200-5 MCG/ACT IN AERO
2.0000 | INHALATION_SPRAY | Freq: Two times a day (BID) | RESPIRATORY_TRACT | Status: DC
  Administered 2017-11-22 – 2017-11-25 (×7): 2 via RESPIRATORY_TRACT
  Filled 2017-11-21: qty 8.8

## 2017-11-21 MED ORDER — ASPIRIN 81 MG PO CHEW
324.0000 mg | CHEWABLE_TABLET | Freq: Once | ORAL | Status: AC
  Administered 2017-11-21: 324 mg via ORAL
  Filled 2017-11-21: qty 4

## 2017-11-21 MED ORDER — ACETAMINOPHEN 160 MG/5ML PO SOLN: 650.0000 mg | ORAL | Status: DC | PRN

## 2017-11-21 MED ORDER — ATORVASTATIN CALCIUM 40 MG PO TABS
40.0000 mg | ORAL_TABLET | Freq: Every day | ORAL | Status: DC
  Administered 2017-11-22 – 2017-11-25 (×4): 40 mg via ORAL
  Filled 2017-11-21 (×4): qty 1

## 2017-11-21 MED ORDER — INSULIN GLARGINE 100 UNIT/ML ~~LOC~~ SOLN
50.0000 [IU] | Freq: Every day | SUBCUTANEOUS | Status: DC
  Administered 2017-11-22: 50 [IU] via SUBCUTANEOUS
  Filled 2017-11-21 (×2): qty 0.5

## 2017-11-21 MED ORDER — INSULIN ASPART 100 UNIT/ML ~~LOC~~ SOLN: 15.0000 [IU] | Freq: Once | SUBCUTANEOUS | Status: DC

## 2017-11-21 MED ORDER — HEPARIN SODIUM (PORCINE) 5000 UNIT/ML IJ SOLN
5000.0000 [IU] | Freq: Three times a day (TID) | INTRAMUSCULAR | Status: DC
  Administered 2017-11-22 – 2017-11-25 (×11): 5000 [IU] via SUBCUTANEOUS
  Filled 2017-11-21 (×11): qty 1

## 2017-11-21 MED ORDER — SODIUM CHLORIDE 0.9 % IV SOLN
INTRAVENOUS | Status: AC
  Administered 2017-11-22: 01:00:00 via INTRAVENOUS

## 2017-11-21 MED ORDER — ASPIRIN 300 MG RE SUPP
300.0000 mg | Freq: Every day | RECTAL | Status: DC
  Filled 2017-11-21: qty 1

## 2017-11-21 MED ORDER — HYDROCODONE-ACETAMINOPHEN 5-325 MG PO TABS
1.0000 | ORAL_TABLET | Freq: Four times a day (QID) | ORAL | Status: DC | PRN
  Administered 2017-11-25: 1 via ORAL
  Filled 2017-11-21: qty 1

## 2017-11-21 MED ORDER — INSULIN ASPART 100 UNIT/ML ~~LOC~~ SOLN
0.0000 [IU] | SUBCUTANEOUS | Status: DC
  Administered 2017-11-22 (×4): 5 [IU] via SUBCUTANEOUS

## 2017-11-21 MED ORDER — ASPIRIN 325 MG PO TABS
325.0000 mg | ORAL_TABLET | Freq: Every day | ORAL | Status: DC
  Administered 2017-11-22 – 2017-11-25 (×4): 325 mg via ORAL
  Filled 2017-11-21 (×4): qty 1

## 2017-11-21 MED ORDER — LORAZEPAM 0.5 MG PO TABS
0.5000 mg | ORAL_TABLET | Freq: Four times a day (QID) | ORAL | Status: DC | PRN
  Administered 2017-11-25: 0.5 mg via ORAL
  Filled 2017-11-21: qty 1

## 2017-11-21 MED ORDER — SODIUM CHLORIDE 0.9% FLUSH
10.0000 mL | INTRAVENOUS | Status: DC | PRN
  Administered 2017-11-24: 10 mL
  Filled 2017-11-21: qty 40

## 2017-11-21 MED ORDER — SODIUM CHLORIDE 0.9 % IV BOLUS (SEPSIS)
1000.0000 mL | Freq: Once | INTRAVENOUS | Status: AC
  Administered 2017-11-22: 1000 mL via INTRAVENOUS

## 2017-11-21 MED ORDER — ISOSORBIDE MONONITRATE ER 30 MG PO TB24
30.0000 mg | ORAL_TABLET | Freq: Every day | ORAL | Status: DC
Start: 2017-11-22 — End: 2017-11-25
  Administered 2017-11-22 – 2017-11-25 (×4): 30 mg via ORAL
  Filled 2017-11-21 (×4): qty 1

## 2017-11-21 MED ORDER — ACETAMINOPHEN 325 MG PO TABS
650.0000 mg | ORAL_TABLET | ORAL | Status: DC | PRN
  Administered 2017-11-22: 650 mg via ORAL
  Filled 2017-11-21: qty 2

## 2017-11-21 MED ORDER — SENNOSIDES-DOCUSATE SODIUM 8.6-50 MG PO TABS: 1.0000 | ORAL_TABLET | Freq: Every evening | ORAL | Status: DC | PRN

## 2017-11-21 NOTE — H&P (Signed)
History and Physical    [name]:[id number] DOB: February 13, 1941 DOA: 11/21/2017  PCP: Marletta Lor, MD   Patient coming from: Home  Chief Complaint: Speech difficulty   HPI: [name] is a 76 y.o. female with medical history significant for acute myeloblastic leukemia, chronic kidney disease stage IV, chronic systolic CHF, coronary artery disease, insulin-dependent diabetes mellitus, and hypertension, now presenting to the emergency department with difficulty communicating.  Patient reports that yesterday afternoon at approximately 3 PM, she was unable to write a check, describing difficulty spelling and knowing what to write where.  She went on to have a fairly unremarkable evening, but today, had difficulty "getting the words out" in the morning, and this persisted throughout the day.  No recent fall or trauma.  No alcohol or substance use, and no headache, chest pain, or focal numbness or weakness reported.  ED Course: Upon arrival to the ED, patient is found to be afebrile, saturating well on room air, and with vitals otherwise stable.  EKG features a sinus rhythm with PVC and first-degree AV nodal block.  Noncontrast head CT is negative for acute intracranial abnormality.  Chemistry panel reveals a sodium of 129, glucose 478, and creatinine of 2.16, up from 1.5 a few weeks ago.  CBC is notable for stable normocytic anemia with hemoglobin of 11.6 and a stable chronic thrombocytopenia with platelets 145,000.  Bone and is within normal limits.  Patient was treated with 324 mg of aspirin and 1 L normal saline in the ED.  Neurology was consulted by the ED physician.  Patient remained hemodynamically stable and in no apparent respiratory distress, and she will be observed on the telemetry unit for ongoing evaluation and management of expressive aphasia, suspected secondary to CVA.  Review of Systems:  All other systems reviewed and apart from HPI, are negative.  Past Medical  History:  Diagnosis Date  . acute myeloblastic leukemia (AML) dx'd 06/2014  . Acute pancreatitis   . Arthritis    "back" (09/17/2017)  . Atrial flutter ([place])   . Cardiomyopathy ([place])   . Chronic kidney disease, stage II (mild)   . Chronic lower back pain   . Colon polyps 04/29/2010   TUBULAR ADENOMA AND A SERRATED ADENOMA  . COPD (chronic obstructive pulmonary disease) ([place])   . CORONARY ARTERY DISEASE 12/24/2007  . DIABETES MELLITUS, TYPE II 07/13/2007  . History of blood transfusion 2015   "related to leukemia"  . History of kidney stones 12/24/2007  . HYPERLIPIDEMIA 12/24/2007  . HYPERTENSION 07/13/2007  . NSTEMI (non-ST elevated myocardial infarction) ([place]) 09/17/2017  . Unspecified disease of pancreas   . Uterine cancer [facility])     Past Surgical History:  Procedure Laterality Date  . ABDOMINAL HYSTERECTOMY     "still have my ovaries"  . CARDIAC CATHETERIZATION  2002  . CARDIAC CATHETERIZATION  09/17/2017  . CATARACT EXTRACTION W/ INTRAOCULAR LENS  IMPLANT, BILATERAL Bilateral   . COLONOSCOPY W/ BIOPSIES AND POLYPECTOMY  2011  . CORONARY STENT INTERVENTION N/A 09/21/2017   Procedure: CORONARY STENT INTERVENTION;  Surgeon: Troy Sine, MD;  Location: [place] [facility];  Service: Cardiovascular;  Laterality: N/A;  . DILATION AND CURETTAGE OF UTERUS    . EXCISIONAL HEMORRHOIDECTOMY  X 2  . GANGLION CYST EXCISION Left X 3  . INTRAVASCULAR PRESSURE WIRE/FFR STUDY N/A 09/17/2017   Procedure: INTRAVASCULAR PRESSURE WIRE/FFR STUDY;  Surgeon: Nelva Bush, MD;  Location: [place] [facility];  Service: Cardiovascular;  Laterality: N/A;  .  NASAL SINUS SURGERY    . PORTA CATH INSERTION Right 2013  . RIGHT/LEFT HEART CATH AND CORONARY ANGIOGRAPHY N/A 09/17/2017   Procedure: RIGHT/LEFT HEART CATH AND CORONARY ANGIOGRAPHY;  Surgeon: Nelva Bush, MD;  Location: [place] [facility];  Service: Cardiovascular;  Laterality: N/A;  . TUBAL LIGATION       reports that she quit smoking  about 16 years ago. Her smoking use included cigarettes. She has a 20.00 pack-year smoking history. she has never used smokeless tobacco. She reports that she does not drink alcohol or use drugs.  No Known Allergies  Family History  Problem Relation Age of Onset  . Cancer Father        throat ca  . COPD Father   . Cancer Sister        uterine ca and breast ca  . Cancer Brother        throat  . Congestive Heart Failure Mother   . Heart attack Maternal Grandmother   . Colon cancer Neg Hx      Prior to Admission medications   Medication Sig Start Date End Date Taking? Authorizing Provider  amLODipine (NORVASC) 5 MG tablet TAKE 1 TABLET (5 MG TOTAL) BY MOUTH DAILY. 10/09/17   Marletta Lor, MD  aspirin 81 MG chewable tablet Chew 1 tablet (81 mg total) by mouth daily. 08/29/17   Barton Dubois, MD  atorvastatin (LIPITOR) 40 MG tablet Take 1 tablet (40 mg total) by mouth daily. 10/09/17 10/09/18  Marletta Lor, MD  clopidogrel (PLAVIX) 75 MG tablet Take 1 tablet (75 mg total) by mouth daily with breakfast. 10/09/17   Marletta Lor, MD  fluticasone furoate-vilanterol (BREO ELLIPTA) 200-25 MCG/INH AEPB Inhale 1 puff into the lungs daily. 11/04/17   Marletta Lor, MD  furosemide (LASIX) 40 MG tablet Take 1 tablet (40 mg total) by mouth daily. 02/10/17   Marletta Lor, MD  HYDROcodone-acetaminophen (NORCO/VICODIN) 5-325 MG tablet Take 1 tablet every 6 (six) hours as needed by mouth for moderate pain. 10/12/17   Marletta Lor, MD  Insulin Glargine (TOUJEO SOLOSTAR) 300 UNIT/ML SOPN Inject 50 Units at bedtime into the skin. 10/26/17   Marletta Lor, MD  insulin lispro (HUMALOG KWIKPEN) 100 UNIT/ML KiwkPen 14 units prior to each meal; BS> 200 add 4 units; BS>300 add 6 units 10/26/17   Marletta Lor, MD  isosorbide mononitrate (IMDUR) 30 MG 24 hr tablet Take 1 tablet (30 mg total) by mouth daily. 10/09/17   Marletta Lor, MD  LORazepam (ATIVAN)  0.5 MG tablet Take 1 tablet (0.5 mg total) every 6 (six) hours as needed by mouth for anxiety. 10/26/17   Marletta Lor, MD  magnesium oxide (MAG-OX) 400 (241.3 Mg) MG tablet Take 2 tablets (800 mg total) by mouth 2 (two) times daily. 10/09/17   Marletta Lor, MD  metoprolol succinate (TOPROL XL) 100 MG 24 hr tablet Take 1 tablet (100 mg total) by mouth daily. Take with or immediately following a meal. 10/09/17 10/09/18  Marletta Lor, MD  mometasone-formoterol [facility]) 200-5 MCG/ACT AERO Inhale 2 puffs into the lungs 2 (two) times daily. 10/09/17   Marletta Lor, MD  nitroGLYCERIN (NITROSTAT) 0.4 MG SL tablet Place 1 tablet (0.4 mg total) under the tongue every 5 (five) minutes as needed for chest pain. 09/24/17   Ledora Bottcher, PA  pioglitazone (ACTOS) 15 MG tablet TAKE 1 TABLET EVERY DAY 10/09/17   Marletta Lor, MD  polyethylene  glycol powder (GLYCOLAX/MIRALAX) powder Take 17 grams by mouth in 8 ounces of water one to two times daily for constipation. 10/09/17   Marletta Lor, MD  promethazine (PHENERGAN) 12.5 MG tablet Take 2 tablets (25 mg total) by mouth every 6 (six) hours as needed for nausea. 05/10/13   [name], PA-C    Physical Exam: Vitals:   11/21/17 1903  BP: (!) 154/53  Pulse: 86  Resp: 18  Temp: 99 F (37.2 C)  TempSrc: Oral  SpO2: 96%      Constitutional: NAD, calm, comfortable Eyes: PERTLA, lids and conjunctivae normal ENMT: Mucous membranes are moist. Posterior pharynx clear of any exudate or lesions.   Neck: normal, supple, no masses, no thyromegaly Respiratory: clear to auscultation bilaterally, no wheezing, no crackles. Normal respiratory effort.    Cardiovascular: S1 & S2 heard, regular rate and rhythm. No significant JVD. Abdomen: No distension, no tenderness, no masses palpated. Bowel sounds normal.  Musculoskeletal: no clubbing / cyanosis. No joint deformity upper and lower extremities.   Skin: no significant  rashes, lesions, ulcers. Poor turgor. Neurologic: CN 2-12 grossly intact. Sensation intact. Strength 5/5 in all 4 limbs.  Psychiatric: Alert and oriented x 3. Calm. Cooperative.     Labs on Admission: I have personally reviewed following labs and imaging studies  CBC: Recent Labs  Lab 11/21/17 1852 11/21/17 1909  WBC 7.2  --   NEUTROABS 5.0  --   HGB 11.6* 11.9*  HCT 35.0* 35.0*  MCV 90.9  --   PLT 145*  --    Basic Metabolic Panel: Recent Labs  Lab 11/21/17 1852 11/21/17 1909  NA 129* 133*  K 4.0 4.1  CL 96* 95*  CO2 22  --   GLUCOSE 478* 474*  BUN 27* 26*  CREATININE 2.16* 2.00*  CALCIUM 8.3*  --    GFR: CrCl cannot be calculated (Unknown ideal weight.). Liver Function Tests: Recent Labs  Lab 11/21/17 1852  AST 20  ALT 17  ALKPHOS 159*  BILITOT 0.7  PROT 5.8*  ALBUMIN 3.1*   No results for input(s): LIPASE, AMYLASE in the last 168 hours. No results for input(s): AMMONIA in the last 168 hours. Coagulation Profile: Recent Labs  Lab 11/21/17 1852  INR 1.02   Cardiac Enzymes: No results for input(s): CKTOTAL, CKMB, CKMBINDEX, TROPONINI in the last 168 hours. BNP (last 3 results) No results for input(s): PROBNP in the last 8760 hours. HbA1C: No results for input(s): HGBA1C in the last 72 hours. CBG: No results for input(s): GLUCAP in the last 168 hours. Lipid Profile: No results for input(s): CHOL, HDL, LDLCALC, TRIG, CHOLHDL, LDLDIRECT in the last 72 hours. Thyroid Function Tests: No results for input(s): TSH, T4TOTAL, FREET4, T3FREE, THYROIDAB in the last 72 hours. Anemia Panel: No results for input(s): VITAMINB12, FOLATE, FERRITIN, TIBC, IRON, RETICCTPCT in the last 72 hours. Urine analysis:    Component Value Date/Time   COLORURINE STRAW (A) 08/24/2017 0247   APPEARANCEUR CLEAR 08/24/2017 0247   LABSPEC 1.009 08/24/2017 0247   LABSPEC 1.015 08/04/2014 1439   PHURINE 5.0 08/24/2017 0247   GLUCOSEU >=500 (A) 08/24/2017 0247   GLUCOSEU  Negative 08/04/2014 1439   HGBUR NEGATIVE 08/24/2017 0247   HGBUR negative 10/10/2010 1607   BILIRUBINUR NEGATIVE 08/24/2017 0247   BILIRUBINUR Negative 08/04/2014 [place] 08/24/2017 0247   PROTEINUR NEGATIVE 08/24/2017 0247   UROBILINOGEN 0.2 08/04/2014 1439   NITRITE NEGATIVE 08/24/2017 [place] 08/24/2017 0247  LEUKOCYTESUR Negative 08/04/2014 1439   Sepsis Labs: @LABRCNTIP (procalcitonin:4,lacticidven:4) )No results found for this or any previous visit (from the past 240 hour(s)).   Radiological Exams on Admission: Ct Head Wo Contrast  Result Date: 11/21/2017 CLINICAL DATA:  Slurred speech EXAM: CT HEAD WITHOUT CONTRAST TECHNIQUE: Contiguous axial images were obtained from the base of the skull through the vertex without intravenous contrast. COMPARISON:  05/29/2015 FINDINGS: Brain: Calcified meningioma is again noted along the anterior falx, stable. Mild age related volume loss. Chronic small vessel disease throughout the deep white matter, stable. No acute intracranial abnormality. Specifically, no hemorrhage, hydrocephalus, acute infarction, or significant intracranial injury. Vascular: No hyperdense vessel or unexpected calcification. Skull: No acute calvarial abnormality. Sinuses/Orbits: Visualized paranasal sinuses and mastoids clear. Orbital soft tissues unremarkable. Other: None IMPRESSION: No acute intracranial abnormality. Atrophy, chronic microvascular disease. Stable meningioma along the anterior falx. Electronically Signed   By: Rolm Baptise M.D.   On: 11/21/2017 19:17    EKG: Independently reviewed. Sinus rhythm, PVC's, 1st degree AV block.   Assessment/Plan  1. Expressive aphasia  - Pt presents with waxing and waning speech difficulty since ~15:00 on 11/20/17  - Head CT is negative for acute findings  - Neurology is consulting and much appreciated; will follow-up on recommendations  - Plan to continue cardiac monitoring, continue  frequent neuro checks, PT/OT/SLP evals  - Check MRI brain, MRA head, carotid dopplers, echocardiogram, fasting lipids, and A1c  - Continue ASA and Plavix, continue Lipitor, glycemic-control as below    2. Acute kidney injury superimposed on CKD stage IV  - SCr is 2.16 on admission, up from 1.5 in October 2018  - Possibly prerenal azotemia superimposed on CKD IV  - Treated in ED with 1 liter NS; continue gentle NS infusion, hold Lasix, repeat chem panel in am   3. Hyponatremia  - Serum sodium is 129 on admission in setting of marked hyperglycemia and hypovolemia  - Treated in ED with 1 liter NS  - Continue gentle IVF hydration overnight, control sugars, repeat chem panel in am    4. Insulin-dependent DM  - A1c was 9.8% in November 2018  - Managed at home with Actos, Toujeo 50 units qHS, and Humalog 14-20 units TID  - Serum glucose is 478 on admission and 15 units Novolog given  - Check CBG q4h until glycemic-control achieved; start Lantus 50 units qHS and Novolog per sliding-scale q4h    5. Chronic systolic CHF  - Appeared dry on admission and given 1 liter NS in ED  - Hold Lasix initially, continue gentle IVF hydration  - Follow daily wts and I/O's; update echo as above    6. CAD  - No anginal complaints  - Continue ASA, Plavix, and Imdur; Toprol held in light of suspected acute ischemic CVA    7. AML  - Follows with oncology and status-post chemotherapy  - Counts are stable with chronic mild normocytic anemia and mild thrombocytopenia on admission    8. Hypertension  - BP at goal  - Hold Toprol and Norvasc while evaluating for ?acute ischemic CVA     DVT prophylaxis: sq heparin  Code Status: Full  Family Communication: Daughter updated at bedside Disposition Plan: Observe on telemetry Consults called: Neurology Admission status: Observation    Vianne Bulls, MD [facility] Pager [phone]  If 7PM-7AM, please contact night-coverage [email or web address] Password  [facility]  11/21/2017, 8:24 PM

## 2017-11-21 NOTE — ED Notes (Signed)
Patient transported to CT 

## 2017-11-21 NOTE — ED Notes (Signed)
Daughter has called again reminding [place] to call when there are MRI results;  I will let patient know that daughter [name] arrived home safely ([id number])

## 2017-11-21 NOTE — ED Provider Notes (Signed)
[facility] EMERGENCY DEPARTMENT Provider Note   CSN: [id number] Arrival date & time: 11/21/17  1845     History   Chief Complaint Chief Complaint  Patient presents with  . Cerebrovascular Accident    HPI [name] is a 76 y.o. female.  HPI   76 year old female with expressive aphasia.  Yesterday she was at the bank trying to write a check.  She was trying to write the word "please" and just couldn't do it. Later in the day her daughter noticed that her speech seemed to be hesitant.  This morning she was try to order breakfast and had difficulty getting her order out.  While in the CT scanner in the emergency room she had some difficulty reciting her birthdate.  Symptoms have waxed and waned since onset.  She was asymptomatic at the time of my evaluation.  She denies any other associated symptoms.  Past Medical History:  Diagnosis Date  . acute myeloblastic leukemia (AML) dx'd 06/2014  . Acute pancreatitis   . Arthritis    "back" (09/17/2017)  . Atrial flutter ([place])   . Cardiomyopathy ([place])   . Chronic kidney disease, stage II (mild)   . Chronic lower back pain   . Colon polyps 04/29/2010   TUBULAR ADENOMA AND A SERRATED ADENOMA  . COPD (chronic obstructive pulmonary disease) ([place])   . CORONARY ARTERY DISEASE 12/24/2007  . DIABETES MELLITUS, TYPE II 07/13/2007  . History of blood transfusion 2015   "related to leukemia"  . History of kidney stones 12/24/2007  . HYPERLIPIDEMIA 12/24/2007  . HYPERTENSION 07/13/2007  . NSTEMI (non-ST elevated myocardial infarction) ([place]) 09/17/2017  . Unspecified disease of pancreas   . Uterine cancer [facility])     Patient Active Problem List   Diagnosis Date Noted  . Chest pain 09/24/2017  . History of non-ST elevation myocardial infarction (NSTEMI)   . Chronic combined systolic and diastolic heart failure ([place])   . COPD (chronic obstructive pulmonary disease) ([place]) 04/24/2017  . Vitamin B12 deficiency 10/29/2016  .  Port catheter in place 05/30/2016  . Other pancytopenia ([place]) 08/09/2014  . Leukemia, acute ([place]) 06/08/2014  . History of TIA (transient ischemic attack) 05/24/2014  . CKD (chronic kidney disease) stage 4, GFR 15-29 ml/min ([facility]) 05/24/2014  . AML (acute myeloblastic leukemia) ([place]) 05/24/2014  . Acute leukemia ([place]) 03/02/2014  . Cervical spine pain 02/24/2014  . Alkaline phosphatase elevation 02/24/2014  . Back pain 02/13/2014  . PAF- flutter 02/04/2014  . Cirrhosis of liver without mention of alcohol 01/31/2014  . Pancreatitis 01/29/2014  . Lesion of pancreas 01/29/2014  . HIP PAIN, LEFT 01/26/2008  . Hyperlipidemia 12/24/2007  . CAD (coronary artery disease) 12/24/2007  . CHEST PAIN, ATYPICAL 12/24/2007  . NEPHROLITHIASIS, HX OF 12/24/2007  . Diabetes mellitus with renal complications ([place]) 16/60/6301  . Essential hypertension 07/13/2007    Past Surgical History:  Procedure Laterality Date  . ABDOMINAL HYSTERECTOMY     "still have my ovaries"  . CARDIAC CATHETERIZATION  2002  . CARDIAC CATHETERIZATION  09/17/2017  . CATARACT EXTRACTION W/ INTRAOCULAR LENS  IMPLANT, BILATERAL Bilateral   . COLONOSCOPY W/ BIOPSIES AND POLYPECTOMY  2011  . CORONARY STENT INTERVENTION N/A 09/21/2017   Procedure: CORONARY STENT INTERVENTION;  Surgeon: Troy Sine, MD;  Location: [facility] CV LAB;  Service: Cardiovascular;  Laterality: N/A;  . DILATION AND CURETTAGE OF UTERUS    . EXCISIONAL HEMORRHOIDECTOMY  X 2  . GANGLION CYST EXCISION Left X 3  .  INTRAVASCULAR PRESSURE WIRE/FFR STUDY N/A 09/17/2017   Procedure: INTRAVASCULAR PRESSURE WIRE/FFR STUDY;  Surgeon: Nelva Bush, MD;  Location: [facility] LAB;  Service: Cardiovascular;  Laterality: N/A;  . NASAL SINUS SURGERY    . PORTA CATH INSERTION Right 2013  . RIGHT/LEFT HEART CATH AND CORONARY ANGIOGRAPHY N/A 09/17/2017   Procedure: RIGHT/LEFT HEART CATH AND CORONARY ANGIOGRAPHY;  Surgeon: Nelva Bush, MD;  Location: [facility];  Service: Cardiovascular;  Laterality: N/A;  . TUBAL LIGATION      OB History    No data available       Home Medications    Prior to Admission medications   Medication Sig Start Date End Date Taking? Authorizing Provider  amLODipine (NORVASC) 5 MG tablet TAKE 1 TABLET (5 MG TOTAL) BY MOUTH DAILY. 10/09/17   Marletta Lor, MD  aspirin 81 MG chewable tablet Chew 1 tablet (81 mg total) by mouth daily. 08/29/17   Barton Dubois, MD  atorvastatin (LIPITOR) 40 MG tablet Take 1 tablet (40 mg total) by mouth daily. 10/09/17 10/09/18  Marletta Lor, MD  clopidogrel (PLAVIX) 75 MG tablet Take 1 tablet (75 mg total) by mouth daily with breakfast. 10/09/17   Marletta Lor, MD  fluticasone furoate-vilanterol (BREO ELLIPTA) 200-25 MCG/INH AEPB Inhale 1 puff into the lungs daily. 11/04/17   Marletta Lor, MD  furosemide (LASIX) 40 MG tablet Take 1 tablet (40 mg total) by mouth daily. 02/10/17   Marletta Lor, MD  HYDROcodone-acetaminophen (NORCO/VICODIN) 5-325 MG tablet Take 1 tablet every 6 (six) hours as needed by mouth for moderate pain. 10/12/17   Marletta Lor, MD  Insulin Glargine (TOUJEO SOLOSTAR) 300 UNIT/ML SOPN Inject 50 Units at bedtime into the skin. 10/26/17   Marletta Lor, MD  insulin lispro (HUMALOG KWIKPEN) 100 UNIT/ML KiwkPen 14 units prior to each meal; BS> 200 add 4 units; BS>300 add 6 units 10/26/17   Marletta Lor, MD  isosorbide mononitrate (IMDUR) 30 MG 24 hr tablet Take 1 tablet (30 mg total) by mouth daily. 10/09/17   Marletta Lor, MD  LORazepam (ATIVAN) 0.5 MG tablet Take 1 tablet (0.5 mg total) every 6 (six) hours as needed by mouth for anxiety. 10/26/17   Marletta Lor, MD  magnesium oxide (MAG-OX) 400 (241.3 Mg) MG tablet Take 2 tablets (800 mg total) by mouth 2 (two) times daily. 10/09/17   Marletta Lor, MD  metoprolol succinate (TOPROL XL) 100 MG 24 hr tablet Take 1 tablet (100 mg total)  by mouth daily. Take with or immediately following a meal. 10/09/17 10/09/18  Marletta Lor, MD  mometasone-formoterol [facility]) 200-5 MCG/ACT AERO Inhale 2 puffs into the lungs 2 (two) times daily. 10/09/17   Marletta Lor, MD  nitroGLYCERIN (NITROSTAT) 0.4 MG SL tablet Place 1 tablet (0.4 mg total) under the tongue every 5 (five) minutes as needed for chest pain. 09/24/17   Ledora Bottcher, PA  pioglitazone (ACTOS) 15 MG tablet TAKE 1 TABLET EVERY DAY 10/09/17   Marletta Lor, MD  polyethylene glycol powder [facility]) powder Take 17 grams by mouth in 8 ounces of water one to two times daily for constipation. 10/09/17   Marletta Lor, MD  promethazine (PHENERGAN) 12.5 MG tablet Take 2 tablets (25 mg total) by mouth every 6 (six) hours as needed for nausea. 05/10/13   [name], PA-C    Family History Family History  Problem Relation Age of Onset  . Cancer  Father        throat ca  . COPD Father   . Cancer Sister        uterine ca and breast ca  . Cancer Brother        throat  . Congestive Heart Failure Mother   . Heart attack Maternal Grandmother   . Colon cancer Neg Hx     Social History Social History   Tobacco Use  . Smoking status: Former Smoker    Packs/day: 1.00    Years: 20.00    Pack years: 20.00    Types: Cigarettes    Last attempt to quit: 12/08/2000    Years since quitting: 16.9  . Smokeless tobacco: Never Used  Substance Use Topics  . Alcohol use: No    Alcohol/week: 0.0 oz  . Drug use: No     Allergies   Patient has no known allergies.   Review of Systems Review of Systems  All systems reviewed and negative, other than as noted in HPI. Physical Exam Updated Vital Signs BP (!) 154/53 (BP Location: Right Arm)   Pulse 86   Temp 99 F (37.2 C) (Oral)   Resp 18   SpO2 96%   Physical Exam  Constitutional: She is oriented to person, place, and time. She appears well-developed and well-nourished. No distress.  HENT:   Head: Normocephalic and atraumatic.  Eyes: Conjunctivae are normal. Right eye exhibits no discharge. Left eye exhibits no discharge.  Neck: Neck supple.  Cardiovascular: Normal rate, regular rhythm and normal heart sounds. Exam reveals no gallop and no friction rub.  No murmur heard. Pulmonary/Chest: Effort normal and breath sounds normal. No respiratory distress.  Abdominal: Soft. She exhibits no distension. There is no tenderness.  Musculoskeletal: She exhibits no edema or tenderness.  Neurological: She is alert and oriented to person, place, and time.  Speech fluent.  Content appropriate.  Follows commands.  Cranial nerves II through XII intact.  Strength is 5 out of 5 bilateral upper and lower extremities.  Skin: Skin is warm and dry.  Psychiatric: She has a normal mood and affect. Her behavior is normal. Thought content normal.  Nursing note and vitals reviewed.    ED Treatments / Results  Labs (all labs ordered are listed, but only abnormal results are displayed) Labs Reviewed  CBC - Abnormal; Notable for the following components:      Result Value   RBC 3.85 (*)    Hemoglobin 11.6 (*)    HCT 35.0 (*)    Platelets 145 (*)    All other components within normal limits  COMPREHENSIVE METABOLIC PANEL - Abnormal; Notable for the following components:   Sodium 129 (*)    Chloride 96 (*)    Glucose, Bld 478 (*)    BUN 27 (*)    Creatinine, Ser 2.16 (*)    Calcium 8.3 (*)    Total Protein 5.8 (*)    Albumin 3.1 (*)    Alkaline Phosphatase 159 (*)    GFR calc non Af Amer 21 (*)    GFR calc Af Amer 25 (*)    All other components within normal limits  I-STAT CHEM 8, ED - Abnormal; Notable for the following components:   Sodium 133 (*)    Chloride 95 (*)    BUN 26 (*)    Creatinine, Ser 2.00 (*)    Glucose, Bld 474 (*)    Calcium, Ion 1.12 (*)    Hemoglobin 11.9 (*)  HCT 35.0 (*)    All other components within normal limits  PROTIME-INR  APTT  DIFFERENTIAL  I-STAT  TROPONIN, ED  CBG MONITORING, ED    EKG  EKG Interpretation  Date/Time:  Saturday November 21 2017 18:50:02 EST Ventricular Rate:  88 PR Interval:  264 QRS Duration: 86 QT Interval:  358 QTC Calculation: 433 R Axis:   91 Text Interpretation:  Sinus rhythm with 1st degree A-V block with Premature supraventricular complexes Rightward axis Low voltage QRS T wave abnormality, consider inferolateral ischemia Abnormal ECG Confirmed by Virgel Manifold [phone]) on 11/21/2017 7:54:44 PM       Radiology Ct Head Wo Contrast  Result Date: 11/21/2017 CLINICAL DATA:  Slurred speech EXAM: CT HEAD WITHOUT CONTRAST TECHNIQUE: Contiguous axial images were obtained from the base of the skull through the vertex without intravenous contrast. COMPARISON:  05/29/2015 FINDINGS: Brain: Calcified meningioma is again noted along the anterior falx, stable. Mild age related volume loss. Chronic small vessel disease throughout the deep white matter, stable. No acute intracranial abnormality. Specifically, no hemorrhage, hydrocephalus, acute infarction, or significant intracranial injury. Vascular: No hyperdense vessel or unexpected calcification. Skull: No acute calvarial abnormality. Sinuses/Orbits: Visualized paranasal sinuses and mastoids clear. Orbital soft tissues unremarkable. Other: None IMPRESSION: No acute intracranial abnormality. Atrophy, chronic microvascular disease. Stable meningioma along the anterior falx. Electronically Signed   By: Rolm Baptise M.D.   On: 11/21/2017 19:17    Procedures Procedures (including critical care time)  Medications Ordered in ED Medications  aspirin chewable tablet 324 mg (not administered)  sodium chloride 0.9 % bolus 1,000 mL (not administered)     Initial Impression / Assessment and Plan / ED Course  I have reviewed the triage vital signs and the nursing notes.  Pertinent labs & imaging results that were available during my care of the patient were reviewed by me  and considered in my medical decision making (see chart for details).     76 year old female with intermittent expressive aphasia since yesterday.  Currently asymptomatic.  CT of the head without acute abnormality.  Will admit for TIA workup.  Final Clinical Impressions(s) / ED Diagnoses   Final diagnoses:  Expressive aphasia  TIA (transient ischemic attack)  Hyperglycemia    ED Discharge Orders    None       Virgel Manifold, MD 11/21/17 2002

## 2017-11-21 NOTE — ED Notes (Addendum)
[place], [place], daughter, wants to be called with results  [phone]  ANY TIME OF DAY or NIGHT

## 2017-11-21 NOTE — Consult Note (Signed)
Neurology Consultation  Reason for Consult: Intermittent aphasia Referring Physician: Dr. Wilson Singer  CC: Inability to get words out, confusion  History is obtained from: Patient  HPI: [name] is a 76 y.o. female who has a past medical history of AML, atrial flutter, cardiomyopathy, chronic kidney disease, colonic polyps, diabetes with blood sugars closer to 500 today, hypertension, who presented to the emergency room for evaluation of intermittent episodes of inability to say the words that she wanted to.  She said that this started around 9 AM this morning, and last for a few minutes and resolved spontaneously and then recurs. She recently had, in October, a stent placed in her heart, following which her diabetes medications were changed from pills to insulin pen.  She feels that her diabetes has not been well controlled ever since. She denies any headaches or visual symptoms.  Denies any focal weakness.  Denies any nausea or vomiting.  Her family also noticed that she had difficulty speaking and this is the reason for which she was brought to the hospital.  She also appeared confused, not knowing where to sign a check. In the ED, she was initially found to be afebrile, saturating well on room air with stable vitals. Her EKG showed PVCs and first-degree AV block.  She had a noncontrast CT scan of the head with no acute changes. Her blood glucose was 478.  Creatinine 2.16, which is higher than her baseline of 1.5 from a few weeks ago.  Trauma cytopenia platelets 145 K neurological consultation was placed for this intermittent aphasia as a possible stroke/TIA.  LKW: 0900 on 11/21/2017 tpa given?: no, outside the window, fluctuating symptoms, currently NIH 0 Premorbid modified Rankin scale (mRS): 1  ROS: A 14 point ROS was performed and is negative except as noted in the HPI.   Past Medical History:  Diagnosis Date  . acute myeloblastic leukemia (AML) dx'd 06/2014  . Acute pancreatitis   .  Arthritis    "back" (09/17/2017)  . Atrial flutter ([place])   . Cardiomyopathy ([place])   . Chronic kidney disease, stage II (mild)   . Chronic lower back pain   . Colon polyps 04/29/2010   TUBULAR ADENOMA AND A SERRATED ADENOMA  . COPD (chronic obstructive pulmonary disease) ([place])   . CORONARY ARTERY DISEASE 12/24/2007  . DIABETES MELLITUS, TYPE II 07/13/2007  . History of blood transfusion 2015   "related to leukemia"  . History of kidney stones 12/24/2007  . HYPERLIPIDEMIA 12/24/2007  . HYPERTENSION 07/13/2007  . NSTEMI (non-ST elevated myocardial infarction) ([place]) 09/17/2017  . Unspecified disease of pancreas   . Uterine cancer ([place])     Family History  Problem Relation Age of Onset  . Cancer Father        throat ca  . COPD Father   . Cancer Sister        uterine ca and breast ca  . Cancer Brother        throat  . Congestive Heart Failure Mother   . Heart attack Maternal Grandmother   . Colon cancer Neg Hx    Social History:   reports that she quit smoking about 16 years ago. Her smoking use included cigarettes. She has a 20.00 pack-year smoking history. she has never used smokeless tobacco. She reports that she does not drink alcohol or use drugs.  Medications  Current Facility-Administered Medications:  .   stroke: mapping our early stages of recovery book, , Does not apply, Once, Opyd, Christia Reading  S, MD .  0.9 %  sodium chloride infusion, , Intravenous, Continuous, Opyd, Ilene Qua, MD .  acetaminophen (TYLENOL) tablet 650 mg, 650 mg, Oral, Q4H PRN **OR** acetaminophen (TYLENOL) solution 650 mg, 650 mg, Per Tube, Q4H PRN **OR** acetaminophen (TYLENOL) suppository 650 mg, 650 mg, Rectal, Q4H PRN, Opyd, Ilene Qua, MD .  Derrill Memo ON 11/22/2017] aspirin suppository 300 mg, 300 mg, Rectal, Daily **OR** [START ON 11/22/2017] aspirin tablet 325 mg, 325 mg, Oral, Daily, Opyd, Ilene Qua, MD .  Derrill Memo ON 11/22/2017] atorvastatin (LIPITOR) tablet 40 mg, 40 mg, Oral, q1800, Opyd, Ilene Qua, MD .   Derrill Memo ON 11/22/2017] clopidogrel (PLAVIX) tablet 75 mg, 75 mg, Oral, Q breakfast, Opyd, Timothy S, MD .  heparin injection 5,000 Units, 5,000 Units, Subcutaneous, Q8H, Opyd, Timothy S, MD .  HYDROcodone-acetaminophen (NORCO/VICODIN) 5-325 MG per tablet 1 tablet, 1 tablet, Oral, Q6H PRN, Opyd, Timothy S, MD .  insulin aspart (novoLOG) injection 0-15 Units, 0-15 Units, Subcutaneous, Q4H, Opyd, Timothy S, MD .  insulin aspart (novoLOG) injection 15 Units, 15 Units, Subcutaneous, Once, Opyd, Timothy S, MD .  insulin glargine (LANTUS) injection 50 Units, 50 Units, Subcutaneous, QHS, Opyd, Ilene Qua, MD .  Derrill Memo ON 11/22/2017] isosorbide mononitrate (IMDUR) 24 hr tablet 30 mg, 30 mg, Oral, Daily, Opyd, Timothy S, MD .  LORazepam (ATIVAN) tablet 0.5 mg, 0.5 mg, Oral, Q6H PRN, Opyd, Timothy S, MD .  mometasone-formoterol (DULERA) 200-5 MCG/ACT inhaler 2 puff, 2 puff, Inhalation, BID, Opyd, Ilene Qua, MD .  senna-docusate (Senokot-S) tablet 1 tablet, 1 tablet, Oral, QHS PRN, Opyd, Timothy S, MD .  sodium chloride 0.9 % bolus 1,000 mL, 1,000 mL, Intravenous, Once, Virgel Manifold, MD  Current Outpatient Medications:  .  amLODipine (NORVASC) 5 MG tablet, TAKE 1 TABLET (5 MG TOTAL) BY MOUTH DAILY., Disp: 90 tablet, Rfl: 1 .  aspirin 81 MG chewable tablet, Chew 1 tablet (81 mg total) by mouth daily., Disp: 30 tablet, Rfl: 1 .  atorvastatin (LIPITOR) 40 MG tablet, Take 1 tablet (40 mg total) by mouth daily., Disp: 90 tablet, Rfl: 3 .  clopidogrel (PLAVIX) 75 MG tablet, Take 1 tablet (75 mg total) by mouth daily with breakfast. (Patient taking differently: Take 75 mg by mouth every evening. ), Disp: 90 tablet, Rfl: 3 .  fluticasone furoate-vilanterol (BREO ELLIPTA) 200-25 MCG/INH AEPB, Inhale 1 puff into the lungs daily. (Patient taking differently: Inhale 1 puff into the lungs as needed. ), Disp: 120 each, Rfl: 5 .  furosemide (LASIX) 40 MG tablet, Take 1 tablet (40 mg total) by mouth daily., Disp: 90 tablet,  Rfl: 1 .  HYDROcodone-acetaminophen (NORCO/VICODIN) 5-325 MG tablet, Take 1 tablet every 6 (six) hours as needed by mouth for moderate pain., Disp: 120 tablet, Rfl: 0 .  Insulin Glargine (TOUJEO SOLOSTAR) 300 UNIT/ML SOPN, Inject 50 Units at bedtime into the skin., Disp: 1 pen, Rfl: 6 .  insulin lispro (HUMALOG KWIKPEN) 100 UNIT/ML KiwkPen, 14 units prior to each meal; BS> 200 add 4 units; BS>300 add 6 units, Disp: 15 mL, Rfl: 11 .  isosorbide mononitrate (IMDUR) 30 MG 24 hr tablet, Take 1 tablet (30 mg total) by mouth daily. (Patient taking differently: Take 30 mg by mouth daily as needed. ), Disp: 90 tablet, Rfl: 3 .  lisinopril (PRINIVIL,ZESTRIL) 10 MG tablet, Take 10 mg by mouth daily., Disp: , Rfl:  .  LORazepam (ATIVAN) 0.5 MG tablet, Take 1 tablet (0.5 mg total) every 6 (six) hours as needed by mouth for anxiety.,  Disp: 90 tablet, Rfl: 5 .  magnesium oxide (MAG-OX) 400 (241.3 Mg) MG tablet, Take 2 tablets (800 mg total) by mouth 2 (two) times daily., Disp: 360 tablet, Rfl: 3 .  metoprolol succinate (TOPROL XL) 100 MG 24 hr tablet, Take 1 tablet (100 mg total) by mouth daily. Take with or immediately following a meal., Disp: 90 tablet, Rfl: 3 .  mometasone-formoterol (DULERA) 200-5 MCG/ACT AERO, Inhale 2 puffs into the lungs 2 (two) times daily. (Patient taking differently: Inhale 2 puffs into the lungs daily as needed. ), Disp: 2 Inhaler, Rfl: 6 .  nitroGLYCERIN (NITROSTAT) 0.4 MG SL tablet, Place 1 tablet (0.4 mg total) under the tongue every 5 (five) minutes as needed for chest pain., Disp: 24 tablet, Rfl: 1 .  polyethylene glycol powder (GLYCOLAX/MIRALAX) powder, Take 17 grams by mouth in 8 ounces of water one to two times daily for constipation., Disp: 850 g, Rfl: 5 .  promethazine (PHENERGAN) 12.5 MG tablet, Take 2 tablets (25 mg total) by mouth every 6 (six) hours as needed for nausea., Disp: 12 tablet, Rfl: 0  Facility-Administered Medications Ordered in Other Encounters:  .  sodium  chloride 0.9 % 1,000 mL with magnesium sulfate 2 g infusion, , Intravenous, Continuous, Ladell Pier, MD, Stopped at 08/04/14 1441  Exam: Current vital signs: BP (!) 154/53 (BP Location: Right Arm)   Pulse 86   Temp 99 F (37.2 C) (Oral)   Resp 18   SpO2 96%  Vital signs in last 24 hours: Temp:  [99 F (37.2 C)] 99 F (37.2 C) (12/15 1903) Pulse Rate:  [86] 86 (12/15 1903) Resp:  [18] 18 (12/15 1903) BP: (154)/(53) 154/53 (12/15 1903) SpO2:  [96 %] 96 % (12/15 1903)  GENERAL: Awake, alert in NAD HEENT: - Normocephalic and atraumatic, dry mm, no LN++, no Thyromegally LUNGS - Clear to auscultation bilaterally with no wheezes CV - S1S2 RRR, no m/r/g, equal pulses bilaterally. ABDOMEN - Soft, nontender, nondistended with normoactive BS Ext: warm, well perfused, intact peripheral pulses, 2+ pitting pedal edema  NEURO:  Mental Status: AA&Ox3  Language: speech is clear.  Naming, repetition, fluency, and comprehension intact.  Good attention concentration at this time Cranial Nerves: PERRL. EOMI, visual fields full, no facial asymmetry, facial sensation intact, hearing reduced bilaterally mildly, tongue/uvula/soft palate midline, normal sternocleidomastoid and trapezius muscle strength. No evidence of tongue atrophy or fibrillations Motor: Grossly 5/5 bilaterally symmetric with no vertical drift, slightly limited lower extremity exam because of pain. Tone: is normal and bulk is normal Sensation- Intact to light touch bilaterally.  No extinction Coordination: FTN intact bilaterally Gait- deferred  NIHSS - 0   Labs I have reviewed labs in epic and the results pertinent to this consultation are: Hemoglobin 11.9, platelet count 147, sodium 133, blood sugar 478, creatinine 2.0, alk phos 159 CBC    Component Value Date/Time   WBC 7.2 11/21/2017 1852   RBC 3.85 (L) 11/21/2017 1852   HGB 11.9 (L) 11/21/2017 1909   HGB 11.2 10/06/2017 1632   HGB 9.6 (L) 09/04/2017 1344   HCT 35.0  (L) 11/21/2017 1909   HCT 35.6 10/06/2017 1632   HCT 29.3 (L) 09/04/2017 1344   PLT 145 (L) 11/21/2017 1852   PLT 213 10/06/2017 1632   MCV 90.9 11/21/2017 1852   MCV 93 10/06/2017 1632   MCV 95.6 09/04/2017 1344   MCH 30.1 11/21/2017 1852   MCHC 33.1 11/21/2017 1852   RDW 13.7 11/21/2017 1852   RDW 15.1 10/06/2017  1632   RDW 14.7 (H) 09/04/2017 1344   LYMPHSABS 1.5 11/21/2017 1852   LYMPHSABS 1.4 09/04/2017 1344   MONOABS 0.5 11/21/2017 1852   MONOABS 0.6 09/04/2017 1344   EOSABS 0.1 11/21/2017 1852   EOSABS 0.1 09/04/2017 1344   BASOSABS 0.0 11/21/2017 1852   BASOSABS 0.0 09/04/2017 1344    CMP     Component Value Date/Time   NA 133 (L) 11/21/2017 1909   NA 140 10/06/2017 1632   NA 144 09/04/2017 1436   K 4.1 11/21/2017 1909   K 4.1 09/04/2017 1436   CL 95 (L) 11/21/2017 1909   CO2 22 11/21/2017 1852   CO2 26 09/04/2017 1436   GLUCOSE 474 (H) 11/21/2017 1909   GLUCOSE 152 (H) 09/04/2017 1436   BUN 26 (H) 11/21/2017 1909   BUN 31 (H) 10/06/2017 1632   BUN 20.6 09/04/2017 1436   CREATININE 2.00 (H) 11/21/2017 1909   CREATININE 1.4 (H) 09/04/2017 1436   CALCIUM 8.3 (L) 11/21/2017 1852   CALCIUM 9.4 09/04/2017 1436   PROT 5.8 (L) 11/21/2017 1852   PROT 6.3 (L) 09/04/2017 1436   ALBUMIN 3.1 (L) 11/21/2017 1852   ALBUMIN 3.4 (L) 09/04/2017 1436   AST 20 11/21/2017 1852   AST 20 09/04/2017 1436   ALT 17 11/21/2017 1852   ALT 15 09/04/2017 1436   ALKPHOS 159 (H) 11/21/2017 1852   ALKPHOS 84 09/04/2017 1436   BILITOT 0.7 11/21/2017 1852   BILITOT 0.68 09/04/2017 1436   GFRNONAA 21 (L) 11/21/2017 1852   GFRAA 25 (L) 11/21/2017 1852    Lipid Panel     Component Value Date/Time   CHOL 117 09/20/2017 0306   TRIG 224 (H) 09/20/2017 0306   HDL 28 (L) 09/20/2017 0306   CHOLHDL 4.2 09/20/2017 0306   VLDL 45 (H) 09/20/2017 0306   LDLCALC 44 09/20/2017 0306   Imaging I have reviewed the images obtained:  CT-scan of the brain no acute changes, chronic  microvascular disease, stable meningioma along the anterior falx.  Assessment:  76 year old woman with past history of AML, atrial flutter not on anticoagulation, currently on dual antiplatelets, cardia myopathy, chronic kidney disease, colonic polyp, diabetes with uncontrolled sugars, hypertension presented to the emergency room for intermittent episodes of inability to find words that she wanted to say.  She also appeared confused to family. Her symptoms are resolved for now.  She has had multiple such episodes during the day today. Noncontrast CT of the head unremarkable for any evidence of evolving infarct or bleed. Blood sugars noted to be very high-closer to 500. I think she would benefit from an evaluation for TIA and stroke but her symptoms could also be attributable primarily to her current hyperglycemia.  Impression: Evaluate for TIA/stroke Hyperglycemia  Recommendations: -Admit to hospitalist  -Telemetry monitoring -Allow for permissive hypertension for the first 24-48h - only treat PRN if SBP >220 mmHg. Blood pressures can be gradually normalized to SBP<140 upon discharge. -MRI, MRA  of the brain without contrast -No CTA due to deranged renal function -Echocardiogram -HgbA1c, fasting lipid panel -Frequent neuro checks -Prophylactic therapy-Antiplatelet med: Aspirin - dose '325mg'$  PO or '300mg'$  PR and Plavic 75. Might need anticoagulation if Afib is seen. Or may need longer term cardiac monitoring -Atorvastatin 80 mg PO daily. -Correction of hyperglycemia per primary team. Needs optimization of glycemic control and med recs. -Correct metabolic derangements - AKI per primary team. -Risk factor modification -PT consult, OT consult, Speech consult  Please page stroke NP/PA/MD (listed  on AMION)  from 8am-4 pm as this patient will be followed by the stroke team at this point.  -- Amie Portland, MD [facility] [phone] If 7pm to 7am, please call on call as listed on  AMION.

## 2017-11-21 NOTE — ED Triage Notes (Addendum)
Pt states yesterday around 3pm she was writing a check and noticed she could not remember what to write or how to spell. This has came and gone since 3pm yesteryda. Pt spoke with daughter on the phone last night and "sounded tired" and patient woke up this morning, did not talk to anyone, but then went to breakfast and tried to order and could not get her words out. She has had mild expressive aphasia throughout the day. No weakness VAN negative

## 2017-11-22 ENCOUNTER — Other Ambulatory Visit: Payer: Self-pay

## 2017-11-22 ENCOUNTER — Observation Stay (HOSPITAL_COMMUNITY): Payer: Medicare HMO

## 2017-11-22 DIAGNOSIS — R4701 Aphasia: Secondary | ICD-10-CM | POA: Diagnosis not present

## 2017-11-22 DIAGNOSIS — I480 Paroxysmal atrial fibrillation: Secondary | ICD-10-CM | POA: Diagnosis not present

## 2017-11-22 DIAGNOSIS — N179 Acute kidney failure, unspecified: Secondary | ICD-10-CM

## 2017-11-22 DIAGNOSIS — J410 Simple chronic bronchitis: Secondary | ICD-10-CM

## 2017-11-22 DIAGNOSIS — E871 Hypo-osmolality and hyponatremia: Secondary | ICD-10-CM | POA: Diagnosis not present

## 2017-11-22 DIAGNOSIS — I10 Essential (primary) hypertension: Secondary | ICD-10-CM | POA: Diagnosis not present

## 2017-11-22 DIAGNOSIS — R739 Hyperglycemia, unspecified: Secondary | ICD-10-CM

## 2017-11-22 DIAGNOSIS — N183 Chronic kidney disease, stage 3 (moderate): Secondary | ICD-10-CM

## 2017-11-22 DIAGNOSIS — N184 Chronic kidney disease, stage 4 (severe): Secondary | ICD-10-CM | POA: Diagnosis not present

## 2017-11-22 DIAGNOSIS — Z794 Long term (current) use of insulin: Secondary | ICD-10-CM | POA: Diagnosis not present

## 2017-11-22 DIAGNOSIS — I2510 Atherosclerotic heart disease of native coronary artery without angina pectoris: Secondary | ICD-10-CM | POA: Diagnosis not present

## 2017-11-22 DIAGNOSIS — E119 Type 2 diabetes mellitus without complications: Secondary | ICD-10-CM | POA: Diagnosis not present

## 2017-11-22 DIAGNOSIS — C9201 Acute myeloblastic leukemia, in remission: Secondary | ICD-10-CM | POA: Diagnosis not present

## 2017-11-22 LAB — LIPID PANEL
Cholesterol: 127 mg/dL (ref 0–200)
HDL: 28 mg/dL — ABNORMAL LOW (ref >40)
LDL CALC: 55 mg/dL (ref 0–99)
Total CHOL/HDL Ratio: 4.5 RATIO
Triglycerides: 222 mg/dL — ABNORMAL HIGH (ref <150)
VLDL: 44 mg/dL — AB (ref 0–40)

## 2017-11-22 LAB — GLUCOSE, CAPILLARY
GLUCOSE-CAPILLARY: 235 mg/dL — AB (ref 65–99)
GLUCOSE-CAPILLARY: 240 mg/dL — AB (ref 65–99)
GLUCOSE-CAPILLARY: 246 mg/dL — AB (ref 65–99)
Glucose-Capillary: 249 mg/dL — ABNORMAL HIGH (ref 65–99)
Glucose-Capillary: 280 mg/dL — ABNORMAL HIGH (ref 65–99)
Glucose-Capillary: 336 mg/dL — ABNORMAL HIGH (ref 65–99)

## 2017-11-22 MED ORDER — INSULIN GLARGINE 100 UNIT/ML ~~LOC~~ SOLN
60.0000 [IU] | Freq: Every day | SUBCUTANEOUS | Status: DC
  Administered 2017-11-22 – 2017-11-24 (×3): 60 [IU] via SUBCUTANEOUS
  Filled 2017-11-22 (×4): qty 0.6

## 2017-11-22 MED ORDER — SODIUM CHLORIDE 0.9 % IV SOLN: INTRAVENOUS | Status: DC

## 2017-11-22 MED ORDER — AMLODIPINE BESYLATE 5 MG PO TABS
5.0000 mg | ORAL_TABLET | Freq: Every day | ORAL | Status: DC
  Administered 2017-11-23 – 2017-11-25 (×3): 5 mg via ORAL
  Filled 2017-11-22 (×3): qty 1

## 2017-11-22 MED ORDER — ASPIRIN 325 MG PO TABS
ORAL_TABLET | ORAL | Status: AC
  Filled 2017-11-22: qty 1

## 2017-11-22 MED ORDER — INSULIN ASPART 100 UNIT/ML ~~LOC~~ SOLN
8.0000 [IU] | Freq: Once | SUBCUTANEOUS | Status: AC
  Administered 2017-11-22: 8 [IU] via SUBCUTANEOUS

## 2017-11-22 MED ORDER — INSULIN ASPART 100 UNIT/ML ~~LOC~~ SOLN
0.0000 [IU] | Freq: Three times a day (TID) | SUBCUTANEOUS | Status: DC
  Administered 2017-11-22: 8 [IU] via SUBCUTANEOUS

## 2017-11-22 MED ORDER — MAGNESIUM OXIDE 400 (241.3 MG) MG PO TABS
800.0000 mg | ORAL_TABLET | Freq: Two times a day (BID) | ORAL | Status: DC
  Administered 2017-11-22 – 2017-11-25 (×6): 800 mg via ORAL
  Filled 2017-11-22 (×6): qty 2

## 2017-11-22 MED ORDER — SODIUM CHLORIDE 0.9 % IV SOLN
1000.0000 mg | Freq: Once | INTRAVENOUS | Status: AC
  Administered 2017-11-22: 1000 mg via INTRAVENOUS
  Filled 2017-11-22: qty 10

## 2017-11-22 MED ORDER — LEVETIRACETAM 500 MG PO TABS
500.0000 mg | ORAL_TABLET | Freq: Two times a day (BID) | ORAL | Status: DC
  Administered 2017-11-23: 500 mg via ORAL
  Filled 2017-11-22: qty 1

## 2017-11-22 MED ORDER — HEPARIN SOD (PORK) LOCK FLUSH 100 UNIT/ML IV SOLN: 500.0000 [IU] | INTRAVENOUS | Status: DC | PRN

## 2017-11-22 MED ORDER — METOPROLOL SUCCINATE ER 100 MG PO TB24
100.0000 mg | ORAL_TABLET | Freq: Every day | ORAL | Status: DC
  Administered 2017-11-23 – 2017-11-25 (×3): 100 mg via ORAL
  Filled 2017-11-22 (×3): qty 1

## 2017-11-22 NOTE — Procedures (Signed)
History: 76 year old female with intermittent episodes of aphasia  Sedation: None  Technique: This is a 21 channel routine scalp EEG performed at the bedside with bipolar and monopolar montages arranged in accordance to the international 10/20 system of electrode placement. One channel was dedicated to EKG recording.    Background: The background consists of intermixed alpha and beta activities. There is a well defined posterior dominant rhythm of 9 - 10 hz that attenuates with eye opening.  There is increasing delta activity associated with drowsiness.  Sleep is recorded with normal appearing structures.   Photic stimulation: Physiologic driving is not performed  EEG Abnormalities: None  Clinical Interpretation: This normal EEG is recorded in the waking and sleep state. There was no seizure or seizure predisposition recorded on this study. Please note that a normal EEG does not preclude the possibility of epilepsy.   Roland Rack, MD [facility] [phone]  If 7pm- 7am, please page neurology on call as listed in [place].

## 2017-11-22 NOTE — Progress Notes (Signed)
Set up LTM; educated nurse and patient on the event button. Also requested a bedside commode. Dr Dimitriu notified.

## 2017-11-22 NOTE — Evaluation (Addendum)
Occupational Therapy Evaluation Patient Details Name: [name] MRN: [id number] DOB: [birth year] Today's Date: 11/22/2017    History of Present Illness 76 y.o. admitted for evaluation of intermittent episodes of inability to say the words that she wanted to. MRI negative for acute infarct. PMH includes AML, atrial flutter, cardiomyopathy, chronic kidney disesase, diabetes, HTN.    Clinical Impression   Pt admitted for above. OT provided education in session and OT signing off.  Follow Up Recommendations  No OT follow up;Supervision - Intermittent    Equipment Recommendations  None recommended by OT    Recommendations for Other Services Speech consult     Precautions / Restrictions Precautions Precautions: Fall Restrictions Weight Bearing Restrictions: No      Mobility Bed Mobility Overal bed mobility: Needs Assistance Bed Mobility: Supine to Sit;Sit to Supine     Supine to sit: Supervision Sit to supine: Supervision      Transfers Overall transfer level: Needs assistance   Transfers: Sit to/from Stand Sit to Stand: Supervision              Balance  No LOB in session.                                          ADL either performed or assessed with clinical judgement   ADL Overall ADL's : Needs assistance/impaired     Grooming: Wash/dry hands;Brushing hair;Sitting;Standing;Supervision/safety               Lower Body Dressing: Minimal assistance;Sit to/from stand   Toilet Transfer: Supervision/safety;Ambulation;Regular Toilet   Toileting- Clothing Manipulation and Hygiene: Modified independent;Sit to/from stand       Functional mobility during ADLs: Supervision/safety General ADL Comments: Educated on BE FAST stroke education. Educated on AE for LB dressing. Educated on energy conservation as pt out of breath in session. Educated on safety.     Vision Baseline Vision/History: Wears glasses Wears Glasses: Reading  only Patient Visual Report: No change from baseline       Perception     Praxis      Pertinent Vitals/Pain Pain Assessment: No/denies pain     Hand Dominance     Extremity/Trunk Assessment Upper Extremity Assessment Upper Extremity Assessment: Overall WFL for tasks assessed   Lower Extremity Assessment Lower Extremity Assessment: Defer to PT evaluation       Communication Communication Communication: Expressive difficulties(one episode in session)   Cognition Arousal/Alertness: (lethargic at beginning but became awake) Behavior During Therapy: WFL for tasks assessed/performed Overall Cognitive Status: Within Functional Limits for tasks assessed                                     General Comments       Exercises     Shoulder Instructions      Home Living Family/patient expects to be discharged to:: Private residence Living Arrangements: Spouse/significant other Available Help at Discharge: Family;Available 24 hours/day Type of Home: House Home Access: Stairs to enter [employer] of Steps: 1 Entrance Stairs-Rails: None Home Layout: One level(with basement)     Bathroom Shower/Tub: Tub/shower unit         Home Equipment: Environmental consultant - 4 wheels;Cane - single point;Grab bars - tub/shower;Shower seat;Adaptive equipment Adaptive Equipment: Reacher        Prior Functioning/Environment Level of Independence:  Independent        Comments: works        OT Problem List: Decreased activity tolerance;Decreased range of motion      OT Treatment/Interventions:      OT Goals(Current goals can be found in the care plan section)    OT Frequency:     Barriers to D/C:            Co-evaluation              AM-PAC PT "6 Clicks" Daily Activity     Outcome Measure Help from another person eating meals?: None Help from another person taking care of personal grooming?: A Little Help from another person toileting, which includes  using toliet, bedpan, or urinal?: A Little Help from another person bathing (including washing, rinsing, drying)?: A Little Help from another person to put on and taking off regular upper body clothing?: A Little Help from another person to put on and taking off regular lower body clothing?: A Little 6 Click Score: 19   End of Session Nurse Communication: Other (comment)(episode of speech difficulty)  Activity Tolerance: Patient tolerated treatment well Patient left: in bed;with call bell/phone within reach;with bed alarm set  OT Visit Diagnosis: Other (comment)(Speech difficulty;decreased activity tolerance)                Time: 2505-3976 OT Time Calculation (min): 19 min Charges:  OT General Charges $OT Visit: 1 Visit OT Evaluation $OT Eval Low Complexity: 1 Low G-Codes: OT G-codes **NOT FOR INPATIENT CLASS** Functional Assessment Tool Used: AM-PAC 6 Clicks Daily Activity Functional Limitation: Self care Self Care Current Status (B3419): At least 40 percent but less than 60 percent impaired, limited or restricted Self Care Goal Status (F7902): At least 40 percent but less than 60 percent impaired, limited or restricted Self Care Discharge Status [phone]): At least 40 percent but less than 60 percent impaired, limited or restricted    Benito Mccreedy OTR/L 11/22/2017, 8:37 AM

## 2017-11-22 NOTE — Evaluation (Signed)
Physical Therapy Evaluation Patient Details Name: [name] MRN: [id number] DOB: November 06, 1941 Today's Date: 11/22/2017   History of Present Illness  76 y.o. admitted for evaluation of intermittent episodes of inability to say the words that she wanted to. MRI negative for acute infarct. PMH includes AML, atrial flutter, cardiomyopathy, chronic kidney disesase, diabetes, HTN.   Clinical Impression  Patient seen for PT evaluation: during ambulation, patient with episode of inability to perform any form of verbal communication accompanied by severe right facial twitching and drooling. Symptoms came about instantly and lasted approximately 1 minute in duration. Patient able to recall all aspects of event. Nsg called to room to assess  Overall, from a mobility standpoint, patient mobilizing at her baseline level of function and shows no overt neuro signs impeding mobility. No further skilled acute PT needs, will sign off.     Follow Up Recommendations No PT follow up    Equipment Recommendations  None recommended by PT    Recommendations for Other Services       Precautions / Restrictions Precautions Precautions: Fall Restrictions Weight Bearing Restrictions: No      Mobility  Bed Mobility Overal bed mobility: Needs Assistance Bed Mobility: Supine to Sit;Sit to Supine     Supine to sit: Supervision Sit to supine: Supervision      Transfers Overall transfer level: Needs assistance Equipment used: None Transfers: Sit to/from Stand Sit to Stand: Supervision            Ambulation/Gait Ambulation/Gait assistance: Supervision Ambulation Distance (Feet): 120 Feet Assistive device: None Gait Pattern/deviations: Wide base of support Gait velocity: decreased   General Gait Details: patient with some decreased cadence/gait speed but overall steady. 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    Modified Rankin (Stroke Patients  Only)       Balance     Sitting balance-Leahy Scale: Good       Standing balance-Leahy Scale: Good               High level balance activites: Direction changes;Turns;Head turns High Level Balance Comments: no difficulty             Pertinent Vitals/Pain Pain Assessment: No/denies pain    Home Living Family/patient expects to be discharged to:: Private residence Living Arrangements: Spouse/significant other Available Help at Discharge: Family;Available 24 hours/day Type of Home: House Home Access: Stairs to enter Entrance Stairs-Rails: None Entrance Stairs-Number of Steps: 1 Home Layout: One level(with basement) Home Equipment: Walker - 4 wheels;Cane - single point;Grab bars - tub/shower;Shower seat;Adaptive equipment      Prior Function Level of Independence: Independent         Comments: works     Journalist, newspaper        Extremity/Trunk Assessment   Upper Extremity Assessment Upper Extremity Assessment: Overall WFL for tasks assessed    Lower Extremity Assessment Lower Extremity Assessment: Overall WFL for tasks assessed       Communication   Communication: Expressive difficulties(one episode in session)  Cognition Arousal/Alertness: (lethargic at beginning but became awake) Behavior During Therapy: WFL for tasks assessed/performed Overall Cognitive Status: Within Functional Limits for tasks assessed                                        General Comments  Exercises     Assessment/Plan    PT Assessment Patent does not need any further PT services  PT Problem List         PT Treatment Interventions      PT Goals (Current goals can be found in the Care Plan section)  Acute Rehab PT Goals PT Goal Formulation: All assessment and education complete, DC therapy    Frequency     Barriers to discharge        Co-evaluation               AM-PAC PT "6 Clicks" Daily Activity  Outcome Measure  Difficulty turning over in bed (including adjusting bedclothes, sheets and blankets)?: None Difficulty moving from lying on back to sitting on the side of the bed? : None Difficulty sitting down on and standing up from a chair with arms (e.g., wheelchair, bedside commode, etc,.)?: None Help needed moving to and from a bed to chair (including a wheelchair)?: None Help needed walking in hospital room?: None Help needed climbing 3-5 steps with a railing? : A Little 6 Click Score: 23    End of Session Equipment Utilized During Treatment: Gait belt Activity Tolerance: Treatment limited secondary to medical complications (Comment)(patient with adverse event during activity (VSS, nsg aware)) Patient left: in bed;with call bell/phone within reach;with nursing/sitter in room Nurse Communication: Mobility status PT Visit Diagnosis: Other symptoms and signs involving the nervous system (R29.898)    Time: 2774-1287 PT Time Calculation (min) (ACUTE ONLY): 20 min   Charges:   PT Evaluation $PT Eval Moderate Complexity: 1 Mod     PT G Codes:   PT G-Codes **NOT FOR INPATIENT CLASS** Functional Assessment Tool Used: Clinical judgement Functional Limitation: Mobility: Walking and moving around Mobility: Walking and Moving Around Current Status ([id number]): At least 1 percent but less than 20 percent impaired, limited or restricted Mobility: Walking and Moving Around Goal Status [phone]): At least 1 percent but less than 20 percent impaired, limited or restricted Mobility: Walking and Moving Around Discharge Status [phone]): At least 1 percent but less than 20 percent impaired, limited or restricted    Alben Deeds, PT DPT  Board Certified Neurologic Specialist [place] 11/22/2017, 10:24 AM

## 2017-11-22 NOTE — Progress Notes (Addendum)
Patient seen for PT evaluation (formal assessment note to follow) during ambulation, patient with episode of inability to perform any form of verbal communication accompanied by SEVERE not subtle right facial twitching and drooling. Symptoms came about instantly and lasted approximately 1 minute in duration. Patient able to recall all aspects of event. Nsg called to room to assess.  Alben Deeds, PT DPT  Board Certified Neurologic Specialist [phone]

## 2017-11-22 NOTE — Progress Notes (Signed)
2+ pitting in lower extremities noted. MD notified, fluids cancelled

## 2017-11-22 NOTE — Progress Notes (Addendum)
Pt had an episode of subtle right sided facial twitching along with expressive aphasia that resolved after 1 minute. PERRL during episode, face symmetrical. No confusion during or after episode, follows commands. AOx 4. Patient reports that during the episode, there is a slight "funny" feeling on right side. Patient says it's not numbness, tingling, or pain. Sensation felt on right side of mouth and arm.  MD notified

## 2017-11-22 NOTE — Progress Notes (Signed)
Patient walked to bathroom, alert and oriented as usual.  She appeared in a daze after she sat on the toilet bowl,  Upper and lower lips twitching with drooling , unable to answer questions. This episode lasted about one minutes.  Patient came around with mild expressive aphasia ,  Alert and oriented and said that she was aware and remembered when it happened.  Vital signs taken pulse 80/min,  BP 156/106 resp.20 and O2 sat at 97% on room air.  Will continue to monitor.

## 2017-11-22 NOTE — Evaluation (Signed)
Speech Language Pathology Evaluation Patient Details Name: [name] MRN: [id number] DOB: October 29, 1941 Today's Date: 11/22/2017 Time:  -     Problem List:  Patient Active Problem List   Diagnosis Date Noted  . Expressive aphasia 11/21/2017  . Hyponatremia 11/21/2017  . TIA (transient ischemic attack)   . Chest pain 09/24/2017  . History of non-ST elevation myocardial infarction (NSTEMI)   . Chronic combined systolic and diastolic heart failure ([place])   . COPD (chronic obstructive pulmonary disease) ([place]) 04/24/2017  . Vitamin B12 deficiency 10/29/2016  . Port catheter in place 05/30/2016  . Other pancytopenia ([place]) 08/09/2014  . Leukemia, acute ([place]) 06/08/2014  . History of TIA (transient ischemic attack) 05/24/2014  . CKD (chronic kidney disease) stage 4, GFR 15-29 ml/min (H[facility]) 05/24/2014  . AML (acute myeloblastic leukemia) ([place]) 05/24/2014  . Acute leukemia ([place]) 03/02/2014  . Cervical spine pain 02/24/2014  . Alkaline phosphatase elevation 02/24/2014  . Back pain 02/13/2014  . PAF- flutter 02/04/2014  . Cirrhosis of liver without mention of alcohol 01/31/2014  . Pancreatitis 01/29/2014  . Lesion of pancreas 01/29/2014  . HIP PAIN, LEFT 01/26/2008  . Hyperlipidemia 12/24/2007  . CAD (coronary artery disease) 12/24/2007  . CHEST PAIN, ATYPICAL 12/24/2007  . NEPHROLITHIASIS, HX OF 12/24/2007  . Insulin dependent diabetes mellitus ([place]) 07/13/2007  . Essential hypertension 07/13/2007   Past Medical History:  Past Medical History:  Diagnosis Date  . acute myeloblastic leukemia (AML) dx'd 06/2014  . Acute pancreatitis   . Arthritis    "back" (09/17/2017)  . Atrial flutter ([place])   . Cardiomyopathy ([place])   . Chronic kidney disease, stage II (mild)   . Chronic lower back pain   . Colon polyps 04/29/2010   TUBULAR ADENOMA AND A SERRATED ADENOMA  . COPD (chronic obstructive pulmonary disease) ([place])   . CORONARY ARTERY DISEASE 12/24/2007  . DIABETES MELLITUS, TYPE II  07/13/2007  . History of blood transfusion 2015   "related to leukemia"  . History of kidney stones 12/24/2007  . HYPERLIPIDEMIA 12/24/2007  . HYPERTENSION 07/13/2007  . NSTEMI (non-ST elevated myocardial infarction) ([place]) 09/17/2017  . Unspecified disease of pancreas   . Uterine cancer [facility])    Past Surgical History:  Past Surgical History:  Procedure Laterality Date  . ABDOMINAL HYSTERECTOMY     "still have my ovaries"  . CARDIAC CATHETERIZATION  2002  . CARDIAC CATHETERIZATION  09/17/2017  . CATARACT EXTRACTION W/ INTRAOCULAR LENS  IMPLANT, BILATERAL Bilateral   . COLONOSCOPY W/ BIOPSIES AND POLYPECTOMY  2011  . CORONARY STENT INTERVENTION N/A 09/21/2017   Procedure: CORONARY STENT INTERVENTION;  Surgeon: Troy Sine, MD;  Location: [place] [facility];  Service: Cardiovascular;  Laterality: N/A;  . DILATION AND CURETTAGE OF UTERUS    . EXCISIONAL HEMORRHOIDECTOMY  X 2  . GANGLION CYST EXCISION Left X 3  . INTRAVASCULAR PRESSURE WIRE/FFR STUDY N/A 09/17/2017   Procedure: INTRAVASCULAR PRESSURE WIRE/FFR STUDY;  Surgeon: Nelva Bush, MD;  Location: [place] [facility];  Service: Cardiovascular;  Laterality: N/A;  . NASAL SINUS SURGERY    . PORTA CATH INSERTION Right 2013  . RIGHT/LEFT HEART CATH AND CORONARY ANGIOGRAPHY N/A 09/17/2017   Procedure: RIGHT/LEFT HEART CATH AND CORONARY ANGIOGRAPHY;  Surgeon: Nelva Bush, MD;  Location: [place] [facility];  Service: Cardiovascular;  Laterality: N/A;  . TUBAL LIGATION     HPI:  Pt is a 76 y.o. female with PMH of acute myeloblastic leukemia, chronic kidney disease stage IV, chronic  systolic CHF, coronary artery disease, insulin-dependent diabetes mellitus, and hypertension, presenting to the emergency department 12/15 with expressive aphasia which has been waxing and waning since 12/14. MRI negative for acute findings. This morning with PT the pt had an episode of R facial twitching and expressive aphasia whicih lasted about 1  minute. Cognitive-linguistic evaluation ordered.   Assessment / Plan / Recommendation Clinical Impression  During this evaluation, pt showed cognitive-linguistic skills within functional limits for tasks assessed. Pt only experiences aphasia during intermittent episodes which she reports last about 1 minute; she said that during those times she "just can't get the words out". Reviewed some compensatory strategies to attempt when episodes do occur such as pointing or gesturing. SLP will sign off at this time; please re-consult if needs arise.    SLP Assessment  SLP Recommendation/Assessment: Patient does not need any further Speech Lanaguage Pathology Services SLP Visit Diagnosis: Aphasia (R47.01)    Follow Up Recommendations  None;Other (comment)(consider o/p if episodes become longer/ more frequent)    Frequency and Duration           SLP Evaluation Cognition  Overall Cognitive Status: Within Functional Limits for tasks assessed Arousal/Alertness: Awake/alert Orientation Level: Oriented X4 Attention: Alternating Alternating Attention: Appears intact Memory: Appears intact Awareness: Appears intact Problem Solving: Appears intact Safety/Judgment: Appears intact       Comprehension  Auditory Comprehension Overall Auditory Comprehension: Appears within functional limits for tasks assessed Yes/No Questions: Within Functional Limits Commands: Within Functional Limits Conversation: Simple Reading Comprehension Reading Status: Within funtional limits    Expression Expression Primary Mode of Expression: Verbal Verbal Expression Overall Verbal Expression: Appears within functional limits for tasks assessed Initiation: No impairment Level of Generative/Spontaneous Verbalization: Conversation Repetition: No impairment Naming: No impairment Pragmatics: No impairment Non-Verbal Means of Communication: Not applicable   Oral / Motor  Oral Motor/Sensory Function Overall Oral  Motor/Sensory Function: Within functional limits Motor Speech Overall Motor Speech: Appears within functional limits for tasks assessed Intelligibility: Intelligible   GO          Functional Assessment Tool Used: clinical judgment Functional Limitations: Spoken language expressive Spoken Language Expression Current Status [phone]): At least 1 percent but less than 20 percent impaired, limited or restricted Spoken Language Expression Goal Status [phone]): At least 1 percent but less than 20 percent impaired, limited or restricted Spoken Language Expression Discharge Status [phone]): At least 1 percent but less than 20 percent impaired, limited or restricted         Kern Reap, [place], CCC-SLP 11/22/2017, 11:43 AM  [phone]

## 2017-11-22 NOTE — Progress Notes (Signed)
PROGRESS NOTE    SHUNNA MIKAELIAN   VCB:[id number]  DOB: 05-Jul-1941  DOA: 11/21/2017 PCP: Marletta Lor, MD   Brief Narrative:  [name] is a 76 y.o. female who has a past medical history of CKD 3, AML, CAD sp stent recently, IDDM with blood sugars closer to 500 in ER, hypertension, who presented to the emergency room for evaluation of intermittent episodes of inability to getting out her words.  She said that she had multiple episodes. She also appeared confused during these episodes.  In ER, noted to have AKI with Cr up to 2.1 from 1.5. Sodium was 129 and chloride was 96    Subjective: She has had 2 more "episodes" again today and had about 4- yesterday. All episodes are inability to speak. Per noted in computer from RNs in the hospital who witnessed 2 of these episodes, she has some facial twitching associated with them. Episodes last about 1 min. ROS: no complaints of nausea, vomiting, constipation diarrhea, cough, dyspnea or dysuria. No other complaints.   Assessment & Plan:   Principal Problem:   Expressive aphasia - CT head:Stable meningioma along the anterior falx.  - MRI/ MRA: negative for acute CVA>>  + ve for extensive intracranial atherosclerosis. - multiple episodes of symptoms could be related to TIAs or seizures - have ordered an EEG  - if EEG negative today, will need 24 hr EEG tomorrow - Neurology to decide on anticoagulation if these episodes are thought to be TIAs    CAD (coronary artery disease) - s/p DES RCA stent in Oct - on Lipitor, ASA and Plavix at home- continue  H/o A-flutter? - apparently had an episode that might have been a-flutter in 2015 - anticoagulation was deferred due to a finding of a pancreatic mass (per notes) - no further episodes of A-fib or flutter that I can see documented in cardiology notes since then-      Insulin dependent diabetes mellitus - uncontrolled - severe hyperglycemia - uses Toujeo 50 U QHS, Lispro prior to  meals - A1c in 10/23/17 was 9.8 - sugars high today, increase Lantus to 60 U  Essential hypertension/ Chronic systolic and grade 1 dCHF - Toprol, Imdur, Norvasc, Lasix at home - dehydrated -see above- holding Lasix-     AKI on CKD (chronic kidney disease) stage 3 Hyponatremia and hypochloremia - hold lasix for today     AML (acute myeloblastic leukemia) - patient states this is in remission    COPD (chronic obstructive pulmonary disease) - stable    DVT prophylaxis: Heparin Code Status: Full code Family Communication:  Disposition Plan: follow on tele Consultants:   neuro Procedures:    Antimicrobials:  Anti-infectives (From admission, onward)   None       Objective: Vitals:   11/22/17 0700 11/22/17 0835 11/22/17 0859 11/22/17 1000  BP: (!) 132/37 (!) 119/42  (!) 156/73  Pulse: 73 76  83  Resp:  20  16  Temp: 98.6 F (37 C) 98.8 F (37.1 C)  98.7 F (37.1 C)  TempSrc: Oral Oral  Oral  SpO2: 98% 95% 97% 99%  Weight:      Height:        Intake/Output Summary (Last 24 hours) at 11/22/2017 1052 Last data filed at 11/22/2017 0835 Gross per 24 hour  Intake 1490 ml  Output -  Net 1490 ml   Filed Weights   11/22/17 0436  Weight: 99.8 kg (220 lb)    Examination: General exam:  Appears comfortable  HEENT: PERRLA, oral mucosa moist, no sclera icterus or thrush Respiratory system: Clear to auscultation. Respiratory effort normal. Cardiovascular system: S1 & S2 heard, RRR.  No murmurs  Gastrointestinal system: Abdomen soft, non-tender, nondistended. Normal bowel sound. No organomegaly Central nervous system: Alert and oriented. No focal neurological deficits. Extremities: No cyanosis, clubbing or edema Skin: No rashes or ulcers Psychiatry:  Mood & affect appropriate.     Data Reviewed: I have personally reviewed following labs and imaging studies  CBC: Recent Labs  Lab 11/21/17 1852 11/21/17 1909  WBC 7.2  --   NEUTROABS 5.0  --   HGB 11.6*  11.9*  HCT 35.0* 35.0*  MCV 90.9  --   PLT 145*  --    Basic Metabolic Panel: Recent Labs  Lab 11/21/17 1852 11/21/17 1909  NA 129* 133*  K 4.0 4.1  CL 96* 95*  CO2 22  --   GLUCOSE 478* 474*  BUN 27* 26*  CREATININE 2.16* 2.00*  CALCIUM 8.3*  --    GFR: Estimated Creatinine Clearance: 26.9 mL/min (A) (by C-G formula based on SCr of 2 mg/dL (H)). Liver Function Tests: Recent Labs  Lab 11/21/17 1852  AST 20  ALT 17  ALKPHOS 159*  BILITOT 0.7  PROT 5.8*  ALBUMIN 3.1*   No results for input(s): LIPASE, AMYLASE in the last 168 hours. No results for input(s): AMMONIA in the last 168 hours. Coagulation Profile: Recent Labs  Lab 11/21/17 1852  INR 1.02   Cardiac Enzymes: No results for input(s): CKTOTAL, CKMB, CKMBINDEX, TROPONINI in the last 168 hours. BNP (last 3 results) No results for input(s): PROBNP in the last 8760 hours. HbA1C: No results for input(s): HGBA1C in the last 72 hours. CBG: Recent Labs  Lab 11/22/17 0021 11/22/17 0411 11/22/17 0743  GLUCAP 235* 246* 249*   Lipid Profile: Recent Labs    11/22/17 0354  CHOL 127  HDL 28*  LDLCALC 55  TRIG 222*  CHOLHDL 4.5   Thyroid Function Tests: No results for input(s): TSH, T4TOTAL, FREET4, T3FREE, THYROIDAB in the last 72 hours. Anemia Panel: No results for input(s): VITAMINB12, FOLATE, FERRITIN, TIBC, IRON, RETICCTPCT in the last 72 hours. Urine analysis:    Component Value Date/Time   COLORURINE STRAW (A) 08/24/2017 0247   APPEARANCEUR CLEAR 08/24/2017 0247   LABSPEC 1.009 08/24/2017 0247   LABSPEC 1.015 08/04/2014 1439   PHURINE 5.0 08/24/2017 0247   GLUCOSEU >=500 (A) 08/24/2017 0247   GLUCOSEU Negative 08/04/2014 1439   HGBUR NEGATIVE 08/24/2017 0247   HGBUR negative 10/10/2010 1607   BILIRUBINUR NEGATIVE 08/24/2017 0247   BILIRUBINUR Negative 08/04/2014 1439   KETONESUR NEGATIVE 08/24/2017 0247   PROTEINUR NEGATIVE 08/24/2017 0247   UROBILINOGEN 0.2 08/04/2014 1439   NITRITE  NEGATIVE 08/24/2017 0247   LEUKOCYTESUR NEGATIVE 08/24/2017 0247   LEUKOCYTESUR Negative 08/04/2014 1439   Sepsis Labs: @LABRCNTIP (procalcitonin:4,lacticidven:4) )No results found for this or any previous visit (from the past 240 hour(s)).       Radiology Studies: Ct Head Wo Contrast  Result Date: 11/21/2017 CLINICAL DATA:  Slurred speech EXAM: CT HEAD WITHOUT CONTRAST TECHNIQUE: Contiguous axial images were obtained from the base of the skull through the vertex without intravenous contrast. COMPARISON:  05/29/2015 FINDINGS: Brain: Calcified meningioma is again noted along the anterior falx, stable. Mild age related volume loss. Chronic small vessel disease throughout the deep white matter, stable. No acute intracranial abnormality. Specifically, no hemorrhage, hydrocephalus, acute infarction, or significant intracranial injury. Vascular: No hyperdense  vessel or unexpected calcification. Skull: No acute calvarial abnormality. Sinuses/Orbits: Visualized paranasal sinuses and mastoids clear. Orbital soft tissues unremarkable. Other: None IMPRESSION: No acute intracranial abnormality. Atrophy, chronic microvascular disease. Stable meningioma along the anterior falx. Electronically Signed   By: Rolm Baptise M.D.   On: 11/21/2017 19:17   Mr Brain Wo Contrast  Result Date: 11/22/2017 CLINICAL DATA:  Initial evaluation for acute mid spurring aphasia. EXAM: MRI HEAD WITHOUT CONTRAST MRA HEAD WITHOUT CONTRAST TECHNIQUE: Multiplanar, multiecho pulse sequences of the brain and surrounding structures were obtained without intravenous contrast. Angiographic images of the head were obtained using MRA technique without contrast. COMPARISON:  Priors CT from earlier the same day as well as previous MRI from 05/24/2014. FINDINGS: MRI HEAD FINDINGS Brain: Generalized age related cerebral atrophy. Patchy and confluent T2/FLAIR hyperintensity within the periventricular and deep white matter both cerebral  hemispheres, nonspecific, but most like related chronic small vessel ischemic disease, relatively stable from previous. No abnormal foci of restricted diffusion to suggest acute or subacute ischemia. Gray-white matter differentiation maintained. No encephalomalacia to suggest chronic infarction. No evidence for acute or chronic intracranial hemorrhage. Densely calcified meningioma straddling the anterior falx again seen measuring 14 mm in size, perhaps minimally increased in size from previous. No associated edema or mass effect. No other mass lesion. No midline shift. No hydrocephalus. No extra-axial fluid collection. Major dural sinuses are grossly patent. Pituitary gland suprasellar region normal. Midline structures intact and normal. Vascular: Major intracranial vascular flow voids are maintained. Skull and upper cervical spine: Craniocervical junction normal. Visualized upper cervical spine normal. Visualized bone marrow is diffusely heterogeneous in appearance, relatively similar to previous. No scalp soft tissue abnormality. Sinuses/Orbits: Globes and orbital soft tissues within normal limits. Patient status post cataract extraction bilaterally. Other: Moderate left sphenoid sinus mucosal thickening. Paranasal sinuses are otherwise clear. Trace left mastoid effusion, of doubtful significance. Inner ear structures normal. MRA HEAD FINDINGS ANTERIOR CIRCULATION: Study moderately degraded by motion artifact. Distal cervical segments of the internal carotid arteries are patent with antegrade flow. Petrous segments widely patent. Atheromatous irregularity throughout the carotid siphons with mild to moderate narrowing on the left, mild narrowing on the right. Origin of the ophthalmic arteries within normal limits. ICA termini patent. Widely patent A1 segments. Anterior communicating artery normal without evidence for aneurysm. ACAs patent to their distal aspects without stenosis. Short-segment mild stenosis at the  origin of the left MCA. M1 segments otherwise patent. Probable new severe proximal right M2 stenosis (series 452, image 15). Distal MCA branches well perfused and symmetric. Small vessel irregularity seen bilaterally. POSTERIOR CIRCULATION: Dominant right vertebral artery patent to the vertebrobasilar junction. Left vertebral artery hypoplastic and somewhat irregular but patent as well without flow-limiting stenosis. Patent right PICA. Left PICA not visualized. Anterior inferior cerebral arteries patent bilaterally. Atheromatous regularity within the proximal basilar without flow-limiting stenosis. Basilar is well perfused distally. Superior cerebral arteries patent bilaterally. PCAs supplied via the basilar and are well perfused to their distal aspects without flow-limiting stenosis. Distal small vessel atheromatous irregularity. No aneurysm. IMPRESSION: MRI HEAD IMPRESSION: 1. No acute intracranial abnormality. 2. Moderate for age nonspecific cerebral white matter changes, most likely due to chronic small vessel ischemic disease. 3. Relatively stable anterior parafalcine meningioma without associated edema. 4. Abnormal appearance of the visualized bone marrow, most likely related to history of myeloblastic leukemia. MRA HEAD IMPRESSION: 1. Motion degraded exam with grossly stable appearance of extensive intracranial atherosclerosis. 2. No large or proximal arterial branch occlusion. No high-grade proximal  or correctable stenosis identified. Electronically Signed   By: Jeannine Boga M.D.   On: 11/22/2017 00:38   Mr Virgel Paling GB Contrast  Result Date: 11/22/2017 CLINICAL DATA:  Initial evaluation for acute mid spurring aphasia. EXAM: MRI HEAD WITHOUT CONTRAST MRA HEAD WITHOUT CONTRAST TECHNIQUE: Multiplanar, multiecho pulse sequences of the brain and surrounding structures were obtained without intravenous contrast. Angiographic images of the head were obtained using MRA technique without contrast.  COMPARISON:  P[facility] CT from earlier the same day as well as previous MRI from 05/24/2014. FINDINGS: MRI HEAD FINDINGS Brain: Generalized age related cerebral atrophy. Patchy and confluent T2/FLAIR hyperintensity within the periventricular and deep white matter both cerebral hemispheres, nonspecific, but most like related chronic small vessel ischemic disease, relatively stable from previous. No abnormal foci of restricted diffusion to suggest acute or subacute ischemia. Gray-white matter differentiation maintained. No encephalomalacia to suggest chronic infarction. No evidence for acute or chronic intracranial hemorrhage. Densely calcified meningioma straddling the anterior falx again seen measuring 14 mm in size, perhaps minimally increased in size from previous. No associated edema or mass effect. No other mass lesion. No midline shift. No hydrocephalus. No extra-axial fluid collection. Major dural sinuses are grossly patent. Pituitary gland suprasellar region normal. Midline structures intact and normal. Vascular: Major intracranial vascular flow voids are maintained. Skull and upper cervical spine: Craniocervical junction normal. Visualized upper cervical spine normal. Visualized bone marrow is diffusely heterogeneous in appearance, relatively similar to previous. No scalp soft tissue abnormality. Sinuses/Orbits: Globes and orbital soft tissues within normal limits. Patient status post cataract extraction bilaterally. Other: Moderate left sphenoid sinus mucosal thickening. Paranasal sinuses are otherwise clear. Trace left mastoid effusion, of doubtful significance. Inner ear structures normal. MRA HEAD FINDINGS ANTERIOR CIRCULATION: Study moderately degraded by motion artifact. Distal cervical segments of the internal carotid arteries are patent with antegrade flow. Petrous segments widely patent. Atheromatous irregularity throughout the carotid siphons with mild to moderate narrowing on the left, mild narrowing  on the right. Origin of the ophthalmic arteries within normal limits. ICA termini patent. Widely patent A1 segments. Anterior communicating artery normal without evidence for aneurysm. ACAs patent to their distal aspects without stenosis. Short-segment mild stenosis at the origin of the left MCA. M1 segments otherwise patent. Probable new severe proximal right M2 stenosis (series 452, image 15). Distal MCA branches well perfused and symmetric. Small vessel irregularity seen bilaterally. POSTERIOR CIRCULATION: Dominant right vertebral artery patent to the vertebrobasilar junction. Left vertebral artery hypoplastic and somewhat irregular but patent as well without flow-limiting stenosis. Patent right PICA. Left PICA not visualized. Anterior inferior cerebral arteries patent bilaterally. Atheromatous regularity within the proximal basilar without flow-limiting stenosis. Basilar is well perfused distally. Superior cerebral arteries patent bilaterally. PCAs supplied via the basilar and are well perfused to their distal aspects without flow-limiting stenosis. Distal small vessel atheromatous irregularity. No aneurysm. IMPRESSION: MRI HEAD IMPRESSION: 1. No acute intracranial abnormality. 2. Moderate for age nonspecific cerebral white matter changes, most likely due to chronic small vessel ischemic disease. 3. Relatively stable anterior parafalcine meningioma without associated edema. 4. Abnormal appearance of the visualized bone marrow, most likely related to history of myeloblastic leukemia. MRA HEAD IMPRESSION: 1. Motion degraded exam with grossly stable appearance of extensive intracranial atherosclerosis. 2. No large or proximal arterial branch occlusion. No high-grade proximal or correctable stenosis identified. Electronically Signed   By: Jeannine Boga M.D.   On: 11/22/2017 00:38      Scheduled Meds: . aspirin  300 mg Rectal Daily  Or  . aspirin  325 mg Oral Daily  . atorvastatin  40 mg Oral q1800    . clopidogrel  75 mg Oral Q breakfast  . heparin  5,000 Units Subcutaneous Q8H  . insulin aspart  0-15 Units Subcutaneous Q4H  . insulin aspart  15 Units Subcutaneous Once  . insulin glargine  50 Units Subcutaneous QHS  . isosorbide mononitrate  30 mg Oral Daily  . mometasone-formoterol  2 puff Inhalation BID   Continuous Infusions:   LOS: 0 days    Time spent in minutes: 35    Debbe Odea, MD [facility] Pager: [email or web address] Password [email or web address] 11/22/2017, 10:52 AM

## 2017-11-22 NOTE — Progress Notes (Signed)
STROKE TEAM PROGRESS NOTE   SUBJECTIVE (INTERVAL HISTORY) Her nursing tech is at the bedside checking glucose which was 250.  She is setting in bed, continues to have episodes overnight. This early morning she had daze, upper/lower lips twitching with drooling, unable to answer questions, lasted one minute. This morning had right facial twitching with expressive aphasia, no confusion, following commands during episode, slight funny feeling on the right. Lasted one minute. Currently pt symptom free.   She has hx of afib, not on [facility], [place], she does not know why, according to cardiology note, it was due to bleeding risk. It was likely due to the AML at that time. Currently, AML in remission after chemo and pt anemia much improved now.    OBJECTIVE Temp:  [97.4 F (36.3 C)-99 F (37.2 C)] 98.7 F (37.1 C) (12/16 1000) Pulse Rate:  [69-86] 83 (12/16 1000) Cardiac Rhythm: Normal sinus rhythm (12/16 0700) Resp:  [16-20] 16 (12/16 1000) BP: (111-156)/(37-106) 156/73 (12/16 1000) SpO2:  [95 %-100 %] 99 % (12/16 1000) Weight:  [220 lb (99.8 kg)] 220 lb (99.8 kg) (12/16 0436)  CBC:  Recent Labs  Lab 11/21/17 1852 11/21/17 1909  WBC 7.2  --   NEUTROABS 5.0  --   HGB 11.6* 11.9*  HCT 35.0* 35.0*  MCV 90.9  --   PLT 145*  --     Basic Metabolic Panel:  Recent Labs  Lab 11/21/17 1852 11/21/17 1909  NA 129* 133*  K 4.0 4.1  CL 96* 95*  CO2 22  --   GLUCOSE 478* 474*  BUN 27* 26*  CREATININE 2.16* 2.00*  CALCIUM 8.3*  --     Lipid Panel:     Component Value Date/Time   CHOL 127 11/22/2017 0354   TRIG 222 (H) 11/22/2017 0354   HDL 28 (L) 11/22/2017 0354   CHOLHDL 4.5 11/22/2017 0354   VLDL 44 (H) 11/22/2017 0354   LDLCALC 55 11/22/2017 0354   HgbA1c:  Lab Results  Component Value Date   HGBA1C 9.8 10/23/2017   Urine Drug Screen: No results found for: LABOPIA, COCAINSCRNUR, LABBENZ, AMPHETMU, THCU, LABBARB  Alcohol Level No results found for: [place] I have personally  reviewed the radiological images below and agree with the radiology interpretations.  Ct Head Wo Contrast 11/21/2017 IMPRESSION:  No acute intracranial abnormality. Atrophy, chronic microvascular disease. Stable meningioma along the anterior falx.   Mr Jodene [name] Head Wo Contrast 11/22/2017 IMPRESSION:  MRI HEAD 1. No acute intracranial abnormality.  2. Moderate for age nonspecific cerebral white matter changes, most likely due to chronic small vessel ischemic disease.  3. Relatively stable anterior parafalcine meningioma without associated edema.  4. Abnormal appearance of the visualized bone marrow, most likely related to history of myeloblastic leukemia.  MRA HEAD  1. Motion degraded exam with grossly stable appearance of extensive intracranial atherosclerosis.  2. No large or proximal arterial branch occlusion. No high-grade proximal or correctable stenosis identified.   Transthoracic Echocardiogram  08/14/2017 - Left ventricle: The cavity size was normal. There was moderate   concentric hypertrophy. Systolic function was mildly to   moderately reduced. The estimated ejection fraction was in the   range of 40% to 45%. Hypokinesis of all mid-apical segments.   Apical akinesis. - Aortic valve: Transvalvular velocity was within the normal range.   There was no stenosis. There was no regurgitation. - Mitral valve: Mild thickening and calcification. Transvalvular   velocity was within the normal range. There was no evidence  for   stenosis. There was trivial regurgitation. - Right ventricle: The cavity size was normal. Wall thickness was   normal. - Tricuspid valve: There was trivial regurgitation. - Pulmonary arteries: Systolic pressure was moderately increased.   PA peak pressure: 51 mm Hg (S).  Bilateral Carotid Dopplers - pending 11/22/2017  EEG - pending   PHYSICAL EXAM  Temp:  [97.4 F (36.3 C)-99 F (37.2 C)] 98.7 F (37.1 C) (12/16 1000) Pulse Rate:  [69-86] 83 (12/16  1000) Resp:  [16-20] 16 (12/16 1000) BP: (111-156)/(37-106) 156/73 (12/16 1000) SpO2:  [95 %-100 %] 99 % (12/16 1000) Weight:  [220 lb (99.8 kg)] 220 lb (99.8 kg) (12/16 0436)  General - obese, well developed, in no apparent distress.  Ophthalmologic - Fundi not visualized due to noncooperation.  Cardiovascular - Regular rate and rhythm.  Mental Status -  Level of arousal and orientation to time, place, and person were intact. Language including expression, naming, repetition, comprehension was assessed and found intact. Attention span and concentration were normal. Fund of Knowledge was assessed and was intact.  Cranial Nerves II - XII - II - Visual field intact OU. III, IV, VI - Extraocular movements intact. V - Facial sensation intact bilaterally. VII - Facial movement intact bilaterally. VIII - Hearing & vestibular intact bilaterally. X - Palate elevates symmetrically. XI - Chin turning & shoulder shrug intact bilaterally. XII - Tongue protrusion intact.  Motor Strength - The patient's strength was normal in all extremities and pronator drift was absent.  Bulk was normal and fasciculations were absent.   Motor Tone - Muscle tone was assessed at the neck and appendages and was normal.  Reflexes - The patient's reflexes were 1+ in all extremities and she had no pathological reflexes.  Sensory - Light touch, temperature/pinprick were assessed and were symmetrical.   Coordination - The patient had normal movements in the hands and feet with no ataxia or dysmetria.  Tremor was absent.  Gait and Station - deferred   ASSESSMENT/PLAN [name] is a 76 y.o. female with history of coronary artery disease with previous MI and stent placement, hypertension, hyperlipidemia, diabetes mellitus, chronic kidney disease, cardiomyopathy, atrial flutter, and acute myeloblastic leukemia presenting with speech difficulties and confusion.  She did not receive IV t-PA due to late  presentation.Marland Kitchen   Possible seizure vs. TIA  Resultant  Intermittent speech difficulty with facial twitching and drooling  CT head - No acute intracranial abnormality.  MRI head -  No acute intracranial abnormality.   EEG - pending  Will consider LTM EEG given frequent episodes  MRA head - no high-grade stenosis.  Carotid Doppler - pending  2D Echo - 08/14/2017 - EF 40-45%.  LDL - 55  HgbA1c - pending  VTE prophylaxis - subcutaneous heparin Diet heart healthy/carb modified Room service appropriate? Yes; Fluid consistency: Thin  aspirin 81 mg daily and clopidogrel 75 mg daily prior to admission, now on aspirin 325 mg daily and clopidogrel 75 mg daily.  Patient counseled to be compliant with her antithrombotic medications  Ongoing aggressive stroke risk factor management  Therapy recommendations:  No follow-up therapies recommended.  Disposition:  Pending  PAF with RVR  Has been following with cardiology  Not on [facility] likely due to bleeding risk in the past with AML  Now anemia much improved  Will need to consider anticoagulation for stroke prevention  CAD s/p stent  Did with cardiology 09/2017  Put on DAPT with ASA and plavix  AML  S/p chemo and now in remission  Following with oncology   Anemia much improved. WBC WNL.   Still has mild thrombocytopenia   Do not feel there is any contraindication for anticoagulation.  Hypertension  Stable  Permissive hypertension (OK if < 220/120) but gradually normalize in 5-7 days  Long-term BP goal normotensive  Hyperlipidemia  Home meds:  Lipitor 40 mg daily resumed in hospital  LDL 55, goal < 70  Statin resumed on admission  Continue statin at discharge  Diabetes  HgbA1c pending, goal < 7.0  Uncontrolled  Hyperglycemia  SSI  CBG monitoring  On lantus  Close PCP follow up  Other Stroke Risk Factors  Advanced age  Former cigarette smoker - quit approximately 16 years ago  Obesity, Body  mass index is 40.24 kg/m., recommend weight loss, diet and exercise as appropriate   CHF with cardiomyopathy  Other Active Problems  AKI on CKD - Cre 2.16->2.00 (baseline 1.4-1.5)   Cardiomyopathy   Hospital day # 0  Rosalin Hawking, MD PhD Stroke Neurology 11/22/2017 2:58 PM   To contact Stroke Continuity provider, please refer to [email or web address] After hours, contact General Neurology

## 2017-11-22 NOTE — Progress Notes (Signed)
EEG Completed; Results Pending  

## 2017-11-22 NOTE — Progress Notes (Signed)
Patient has some slow activity associated with her episodes of aphasia.  There does seem to be possibly some evolution with some the episodes, I suspect that these are likely to be partial seizures and I am starting Keppra 500 mg twice daily  Roland Rack, MD [facility] [phone]  If 7pm- 7am, please page neurology on call as listed in [place].

## 2017-11-22 NOTE — Progress Notes (Signed)
Patient had another episode of expressive aphasia after returning from using the bathroom. No facial twitching noted, but excessive stuttering

## 2017-11-23 ENCOUNTER — Observation Stay (HOSPITAL_COMMUNITY): Payer: Medicare HMO

## 2017-11-23 ENCOUNTER — Other Ambulatory Visit: Payer: Self-pay | Admitting: *Deleted

## 2017-11-23 DIAGNOSIS — D696 Thrombocytopenia, unspecified: Secondary | ICD-10-CM | POA: Diagnosis present

## 2017-11-23 DIAGNOSIS — E1165 Type 2 diabetes mellitus with hyperglycemia: Secondary | ICD-10-CM | POA: Diagnosis present

## 2017-11-23 DIAGNOSIS — I5022 Chronic systolic (congestive) heart failure: Secondary | ICD-10-CM | POA: Diagnosis present

## 2017-11-23 DIAGNOSIS — R4701 Aphasia: Secondary | ICD-10-CM | POA: Diagnosis present

## 2017-11-23 DIAGNOSIS — I5042 Chronic combined systolic (congestive) and diastolic (congestive) heart failure: Secondary | ICD-10-CM | POA: Diagnosis not present

## 2017-11-23 DIAGNOSIS — I4892 Unspecified atrial flutter: Secondary | ICD-10-CM | POA: Diagnosis present

## 2017-11-23 DIAGNOSIS — E119 Type 2 diabetes mellitus without complications: Secondary | ICD-10-CM | POA: Diagnosis not present

## 2017-11-23 DIAGNOSIS — I252 Old myocardial infarction: Secondary | ICD-10-CM | POA: Diagnosis not present

## 2017-11-23 DIAGNOSIS — I440 Atrioventricular block, first degree: Secondary | ICD-10-CM | POA: Diagnosis present

## 2017-11-23 DIAGNOSIS — E1122 Type 2 diabetes mellitus with diabetic chronic kidney disease: Secondary | ICD-10-CM | POA: Diagnosis present

## 2017-11-23 DIAGNOSIS — I429 Cardiomyopathy, unspecified: Secondary | ICD-10-CM | POA: Diagnosis present

## 2017-11-23 DIAGNOSIS — I130 Hypertensive heart and chronic kidney disease with heart failure and stage 1 through stage 4 chronic kidney disease, or unspecified chronic kidney disease: Secondary | ICD-10-CM | POA: Diagnosis present

## 2017-11-23 DIAGNOSIS — G8929 Other chronic pain: Secondary | ICD-10-CM | POA: Diagnosis present

## 2017-11-23 DIAGNOSIS — C9201 Acute myeloblastic leukemia, in remission: Secondary | ICD-10-CM | POA: Diagnosis not present

## 2017-11-23 DIAGNOSIS — E871 Hypo-osmolality and hyponatremia: Secondary | ICD-10-CM | POA: Diagnosis present

## 2017-11-23 DIAGNOSIS — I2510 Atherosclerotic heart disease of native coronary artery without angina pectoris: Secondary | ICD-10-CM | POA: Diagnosis present

## 2017-11-23 DIAGNOSIS — G40209 Localization-related (focal) (partial) symptomatic epilepsy and epileptic syndromes with complex partial seizures, not intractable, without status epilepticus: Secondary | ICD-10-CM

## 2017-11-23 DIAGNOSIS — C9200 Acute myeloblastic leukemia, not having achieved remission: Secondary | ICD-10-CM | POA: Diagnosis present

## 2017-11-23 DIAGNOSIS — R569 Unspecified convulsions: Secondary | ICD-10-CM

## 2017-11-23 DIAGNOSIS — R29700 NIHSS score 0: Secondary | ICD-10-CM | POA: Diagnosis present

## 2017-11-23 DIAGNOSIS — Z794 Long term (current) use of insulin: Secondary | ICD-10-CM | POA: Diagnosis not present

## 2017-11-23 DIAGNOSIS — J410 Simple chronic bronchitis: Secondary | ICD-10-CM | POA: Diagnosis not present

## 2017-11-23 DIAGNOSIS — Z961 Presence of intraocular lens: Secondary | ICD-10-CM | POA: Diagnosis present

## 2017-11-23 DIAGNOSIS — G459 Transient cerebral ischemic attack, unspecified: Secondary | ICD-10-CM | POA: Diagnosis present

## 2017-11-23 DIAGNOSIS — N184 Chronic kidney disease, stage 4 (severe): Secondary | ICD-10-CM | POA: Diagnosis present

## 2017-11-23 DIAGNOSIS — N179 Acute kidney failure, unspecified: Secondary | ICD-10-CM | POA: Diagnosis present

## 2017-11-23 DIAGNOSIS — I10 Essential (primary) hypertension: Secondary | ICD-10-CM | POA: Diagnosis not present

## 2017-11-23 DIAGNOSIS — Z8673 Personal history of transient ischemic attack (TIA), and cerebral infarction without residual deficits: Secondary | ICD-10-CM | POA: Diagnosis not present

## 2017-11-23 DIAGNOSIS — J449 Chronic obstructive pulmonary disease, unspecified: Secondary | ICD-10-CM | POA: Diagnosis present

## 2017-11-23 DIAGNOSIS — E861 Hypovolemia: Secondary | ICD-10-CM | POA: Diagnosis present

## 2017-11-23 DIAGNOSIS — Z6841 Body Mass Index (BMI) 40.0 and over, adult: Secondary | ICD-10-CM | POA: Diagnosis not present

## 2017-11-23 DIAGNOSIS — R253 Fasciculation: Secondary | ICD-10-CM | POA: Diagnosis present

## 2017-11-23 DIAGNOSIS — E785 Hyperlipidemia, unspecified: Secondary | ICD-10-CM | POA: Diagnosis present

## 2017-11-23 DIAGNOSIS — R739 Hyperglycemia, unspecified: Secondary | ICD-10-CM | POA: Diagnosis not present

## 2017-11-23 LAB — BASIC METABOLIC PANEL
Anion gap: 8 (ref 5–15)
BUN: 23 mg/dL — AB (ref 6–20)
CALCIUM: 8.1 mg/dL — AB (ref 8.9–10.3)
CO2: 24 mmol/L (ref 22–32)
CREATININE: 1.55 mg/dL — AB (ref 0.44–1.00)
Chloride: 105 mmol/L (ref 101–111)
GFR calc non Af Amer: 32 mL/min — ABNORMAL LOW (ref >60)
GFR, EST AFRICAN AMERICAN: 37 mL/min — AB (ref >60)
Glucose, Bld: 137 mg/dL — ABNORMAL HIGH (ref 65–99)
Potassium: 3.8 mmol/L (ref 3.5–5.1)
SODIUM: 137 mmol/L (ref 135–145)

## 2017-11-23 LAB — GLUCOSE, CAPILLARY
GLUCOSE-CAPILLARY: 178 mg/dL — AB (ref 65–99)
GLUCOSE-CAPILLARY: 241 mg/dL — AB (ref 65–99)
Glucose-Capillary: 135 mg/dL — ABNORMAL HIGH (ref 65–99)
Glucose-Capillary: 198 mg/dL — ABNORMAL HIGH (ref 65–99)
Glucose-Capillary: 390 mg/dL — ABNORMAL HIGH (ref 65–99)
Glucose-Capillary: 257 mg/dL — ABNORMAL HIGH (ref 65–99)

## 2017-11-23 MED ORDER — INSULIN ASPART 100 UNIT/ML ~~LOC~~ SOLN
0.0000 [IU] | Freq: Three times a day (TID) | SUBCUTANEOUS | Status: DC
  Administered 2017-11-23: 7 [IU] via SUBCUTANEOUS
  Administered 2017-11-23: 3 [IU] via SUBCUTANEOUS
  Administered 2017-11-23: 20 [IU] via SUBCUTANEOUS
  Administered 2017-11-24: 3 [IU] via SUBCUTANEOUS
  Administered 2017-11-24: 15 [IU] via SUBCUTANEOUS
  Administered 2017-11-24 – 2017-11-25 (×2): 11 [IU] via SUBCUTANEOUS
  Administered 2017-11-25 (×2): 7 [IU] via SUBCUTANEOUS

## 2017-11-23 MED ORDER — LEVETIRACETAM 500 MG PO TABS: 1000.0000 mg | ORAL_TABLET | Freq: Two times a day (BID) | ORAL | Status: DC

## 2017-11-23 MED ORDER — SODIUM CHLORIDE 0.9 % IV SOLN
500.0000 mg | Freq: Once | INTRAVENOUS | Status: AC
  Administered 2017-11-23: 500 mg via INTRAVENOUS
  Filled 2017-11-23: qty 5

## 2017-11-23 MED ORDER — LEVETIRACETAM 500 MG PO TABS
1000.0000 mg | ORAL_TABLET | Freq: Two times a day (BID) | ORAL | Status: DC
  Administered 2017-11-23 – 2017-11-25 (×4): 1000 mg via ORAL
  Filled 2017-11-23 (×4): qty 2

## 2017-11-23 MED ORDER — INSULIN ASPART 100 UNIT/ML ~~LOC~~ SOLN
0.0000 [IU] | Freq: Every day | SUBCUTANEOUS | Status: DC
  Administered 2017-11-23: 3 [IU] via SUBCUTANEOUS

## 2017-11-23 NOTE — Progress Notes (Signed)
LTM takedown; no skin breakdown was seen. Notified Dr Deborah Chalk.

## 2017-11-23 NOTE — Procedures (Signed)
  Electroencephalogram report- LTM   Data acquisition: 10-20 electrode placement.  Additional T1, T2, and EKG electrodes; 26 channel digital referential acquisition reformatted to 18 channel/7 channel coronal bipolar     Spike detection: ON     Seizure detection: ON   Beginning time: 11/22/2017 at 15 00 Ending time: 11/23/2017 at 7:43 AM  CPT: 95951 Day of study: Day 1   This 17  hours of intensive EEG monitoring with simultaneous video monitoring was performed for this patient with spells of aphasia as a part of ongoing series to capture events of interest and determine if these are seizures.    Medications: As per EMR  Waking background activities were marked by  9 cps posterior dominant symmetric synchronized alpha rhythm which tends to attenuate with eyes opening.  11 pushbutton activation events were recorded.  On all occasions activated by patient in response to the spell was accompanied by inability to speak.  Electrographically the surveillance were accompanied by buildup of rhythmic synchronized evolve in 5-6 cps sharp waves with some seizures evolving into high amplitude delta slowing in at the end of the ictal pattern.  At the onset of ictal discharges appeared fairly broad through the left hemisphere with maximum negativity to left frontotemporal region.  As seizure evolved there is appear to be a negative field extending into the right hemisphere as well particularly parieto-occipital cortex.  Clinically patient reaches for event button she is not tested.  She does not spontaneously exhibiting any vocalizations or speech.  There is bilateral hands movements that often appears in a purposeless way without clear lateralizing features.  On a few occasions with procedures patient was covered and so semiology is difficult to correct arise and on some occasions she was not on the camera.  Clinical interpretation: This 17 hours of intensive EEG monitoring with simultaneous video  monitoring recorded 11 stereotypical events of interest marked by aphasia.  Patient was able to identify all of this events of interest by activating the event button.  All of these events of interest were accompanied by ictal pattern lateralizing to the left hemisphere localizing to the left frontotemporal region with broad field and evolving features as discussed above.  This consistent with complex partial seizures involved in left hemisphere particular left frontotemporal region however ictal pattern appears fairly broad across left hemisphere.  Clinical correlation is advised.

## 2017-11-23 NOTE — Progress Notes (Signed)
VASCULAR LAB PRELIMINARY  PRELIMINARY  PRELIMINARY  PRELIMINARY  Carotid duplex completed.    Preliminary report:  1-39% ICA stenosis.  Vertebral artery flow is antegrade.   Debera Sterba, RVT 11/23/2017, 3:20 PM

## 2017-11-23 NOTE — Progress Notes (Signed)
PROGRESS NOTE    [name]   IOE:[id number]  DOB: Oct 25, 1941  DOA: 11/21/2017 PCP: Marletta Lor, MD   Brief Narrative:  [name] is a 76 y.o. female who has a past medical history of CKD 3, AML, CAD sp stent recently, IDDM with blood sugars closer to 500 in ER, hypertension, who presented to the emergency room for evaluation of intermittent episodes of inability to getting out her words.  She said that she had multiple episodes. She also appeared confused during these episodes.  In ER, noted to have AKI with Cr up to 2.1 from 1.5. Sodium was 129 and chloride was 96    Subjective: Loss of speech episodes have resolved since yesterday. She still is occassionally feeling "like something is about the happen" and this is bothering her.  ROS: no complaints of nausea, vomiting, constipation diarrhea, cough, dyspnea or dysuria. No other complaints.   Assessment & Plan:   Principal Problem:   Expressive aphasia - CT head:Stable meningioma along the anterior falx.  - MRI/ MRA: negative for acute CVA>>  + ve for extensive intracranial atherosclerosis. - multiple episodes of symptoms could be related to TIAs or seizures - have ordered an EEG  - 24 hr EEG in process - Keppra has been started and spells seem to have resolved    CAD (coronary artery disease) - s/p DES RCA stent in Oct - on Lipitor, ASA and Plavix at home- continue  H/o A-flutter? - apparently had an episode that might have been a-flutter in 2015 - anticoagulation was deferred due to a finding of a pancreatic mass (per notes) - no further episodes of A-fib or flutter that I can see documented in cardiology notes since then-     Insulin dependent diabetes mellitus - uncontrolled - severe hyperglycemia - uses Toujeo 50 U QHS, Lispro prior to meals - A1c in 10/23/17 was 9.8 - sugars high today, increase Lantus to 60 U  Essential hypertension/ Chronic systolic and grade 1 dCHF - Toprol, Imdur, Norvasc,  Lasix at home - dehydrated -see above- holding Lasix-     AKI on CKD (chronic kidney disease) stage 3 Hyponatremia and hypochloremia - hold lasix      AML (acute myeloblastic leukemia) - patient states this is in remission    COPD (chronic obstructive pulmonary disease) - stable    DVT prophylaxis: Heparin Code Status: Full code Family Communication:  Disposition Plan: follow on tele Consultants:   neuro Procedures:   EEG -Clinical Interpretation: This normal EEG is recorded in the waking and sleep state. There was no seizure or seizure predisposition recorded on this study. Please note that a normal EEG does not preclude the possibility of epilepsy.     Antimicrobials:  Anti-infectives (From admission, onward)   None       Objective: Vitals:   11/23/17 0415 11/23/17 0938 11/23/17 0945 11/23/17 1400  BP: (!) 140/56 (!) 147/47  (!) 142/60  Pulse: 81 89 (!) 102 85  Resp: 16 18 16 18   Temp: 98.2 F (36.8 C) 97.8 F (36.6 C)  98.3 F (36.8 C)  TempSrc: Oral Oral  Oral  SpO2: 99% 99% 97% 97%  Weight:      Height:        Intake/Output Summary (Last 24 hours) at 11/23/2017 1526 Last data filed at 11/23/2017 1300 Gross per 24 hour  Intake 600 ml  Output -  Net 600 ml   Filed Weights   11/22/17 0436  Weight:  99.8 kg (220 lb)    Examination: General exam: Appears comfortable  HEENT: PERRLA, oral mucosa moist, no sclera icterus or thrush Respiratory system: Clear to auscultation. Respiratory effort normal. Cardiovascular system: S1 & S2 heard, RRR.  No murmurs  Gastrointestinal system: Abdomen soft, non-tender, nondistended. Normal bowel sound. No organomegaly Central nervous system: Alert and oriented. No focal neurological deficits. Extremities: No cyanosis, clubbing or edema Skin: No rashes or ulcers Psychiatry:  Mood & affect appropriate.     Data Reviewed: I have personally reviewed following labs and imaging studies  CBC: Recent Labs  Lab  11/21/17 1852 11/21/17 1909  WBC 7.2  --   NEUTROABS 5.0  --   HGB 11.6* 11.9*  HCT 35.0* 35.0*  MCV 90.9  --   PLT 145*  --    Basic Metabolic Panel: Recent Labs  Lab 11/21/17 1852 11/21/17 1909 11/23/17 0335  NA 129* 133* 137  K 4.0 4.1 3.8  CL 96* 95* 105  CO2 22  --  24  GLUCOSE 478* 474* 137*  BUN 27* 26* 23*  CREATININE 2.16* 2.00* 1.55*  CALCIUM 8.3*  --  8.1*   GFR: Estimated Creatinine Clearance: 34.7 mL/min (A) (by C-G formula based on SCr of 1.55 mg/dL (H)). Liver Function Tests: Recent Labs  Lab 11/21/17 1852  AST 20  ALT 17  ALKPHOS 159*  BILITOT 0.7  PROT 5.8*  ALBUMIN 3.1*   No results for input(s): LIPASE, AMYLASE in the last 168 hours. No results for input(s): AMMONIA in the last 168 hours. Coagulation Profile: Recent Labs  Lab 11/21/17 1852  INR 1.02   Cardiac Enzymes: No results for input(s): CKTOTAL, CKMB, CKMBINDEX, TROPONINI in the last 168 hours. BNP (last 3 results) No results for input(s): PROBNP in the last 8760 hours. HbA1C: No results for input(s): HGBA1C in the last 72 hours. CBG: Recent Labs  Lab 11/22/17 1935 11/23/17 0014 11/23/17 0357 11/23/17 0742 11/23/17 1127  GLUCAP 336* 178* 135* 198* 390*   Lipid Profile: Recent Labs    11/22/17 0354  CHOL 127  HDL 28*  LDLCALC 55  TRIG 222*  CHOLHDL 4.5   Thyroid Function Tests: No results for input(s): TSH, T4TOTAL, FREET4, T3FREE, THYROIDAB in the last 72 hours. Anemia Panel: No results for input(s): VITAMINB12, FOLATE, FERRITIN, TIBC, IRON, RETICCTPCT in the last 72 hours. Urine analysis:    Component Value Date/Time   COLORURINE STRAW (A) 08/24/2017 0247   APPEARANCEUR CLEAR 08/24/2017 0247   LABSPEC 1.009 08/24/2017 0247   LABSPEC 1.015 08/04/2014 1439   PHURINE 5.0 08/24/2017 0247   GLUCOSEU >=500 (A) 08/24/2017 0247   GLUCOSEU Negative 08/04/2014 1439   HGBUR NEGATIVE 08/24/2017 0247   HGBUR negative 10/10/2010 1607   BILIRUBINUR NEGATIVE 08/24/2017  0247   BILIRUBINUR Negative 08/04/2014 1439   KETONESUR NEGATIVE 08/24/2017 0247   PROTEINUR NEGATIVE 08/24/2017 0247   UROBILINOGEN 0.2 08/04/2014 1439   NITRITE NEGATIVE 08/24/2017 0247   LEUKOCYTESUR NEGATIVE 08/24/2017 0247   LEUKOCYTESUR Negative 08/04/2014 1439   Sepsis Labs: @LABRCNTIP (procalcitonin:4,lacticidven:4) )No results found for this or any previous visit (from the past 240 hour(s)).       Radiology Studies: Ct Head Wo Contrast  Result Date: 11/21/2017 CLINICAL DATA:  Slurred speech EXAM: CT HEAD WITHOUT CONTRAST TECHNIQUE: Contiguous axial images were obtained from the base of the skull through the vertex without intravenous contrast. COMPARISON:  05/29/2015 FINDINGS: Brain: Calcified meningioma is again noted along the anterior falx, stable. Mild age related volume loss. Chronic  small vessel disease throughout the deep white matter, stable. No acute intracranial abnormality. Specifically, no hemorrhage, hydrocephalus, acute infarction, or significant intracranial injury. Vascular: No hyperdense vessel or unexpected calcification. Skull: No acute calvarial abnormality. Sinuses/Orbits: Visualized paranasal sinuses and mastoids clear. Orbital soft tissues unremarkable. Other: None IMPRESSION: No acute intracranial abnormality. Atrophy, chronic microvascular disease. Stable meningioma along the anterior falx. Electronically Signed   By: Rolm Baptise M.D.   On: 11/21/2017 19:17   Mr Brain Wo Contrast  Result Date: 11/22/2017 CLINICAL DATA:  Initial evaluation for acute mid spurring aphasia. EXAM: MRI HEAD WITHOUT CONTRAST MRA HEAD WITHOUT CONTRAST TECHNIQUE: Multiplanar, multiecho pulse sequences of the brain and surrounding structures were obtained without intravenous contrast. Angiographic images of the head were obtained using MRA technique without contrast. COMPARISON:  Priors CT from earlier the same day as well as previous MRI from 05/24/2014. FINDINGS: MRI HEAD FINDINGS  Brain: Generalized age related cerebral atrophy. Patchy and confluent T2/FLAIR hyperintensity within the periventricular and deep white matter both cerebral hemispheres, nonspecific, but most like related chronic small vessel ischemic disease, relatively stable from previous. No abnormal foci of restricted diffusion to suggest acute or subacute ischemia. Gray-white matter differentiation maintained. No encephalomalacia to suggest chronic infarction. No evidence for acute or chronic intracranial hemorrhage. Densely calcified meningioma straddling the anterior falx again seen measuring 14 mm in size, perhaps minimally increased in size from previous. No associated edema or mass effect. No other mass lesion. No midline shift. No hydrocephalus. No extra-axial fluid collection. Major dural sinuses are grossly patent. Pituitary gland suprasellar region normal. Midline structures intact and normal. Vascular: Major intracranial vascular flow voids are maintained. Skull and upper cervical spine: Craniocervical junction normal. Visualized upper cervical spine normal. Visualized bone marrow is diffusely heterogeneous in appearance, relatively similar to previous. No scalp soft tissue abnormality. Sinuses/Orbits: Globes and orbital soft tissues within normal limits. Patient status post cataract extraction bilaterally. Other: Moderate left sphenoid sinus mucosal thickening. Paranasal sinuses are otherwise clear. Trace left mastoid effusion, of doubtful significance. Inner ear structures normal. MRA HEAD FINDINGS ANTERIOR CIRCULATION: Study moderately degraded by motion artifact. Distal cervical segments of the internal carotid arteries are patent with antegrade flow. Petrous segments widely patent. Atheromatous irregularity throughout the carotid siphons with mild to moderate narrowing on the left, mild narrowing on the right. Origin of the ophthalmic arteries within normal limits. ICA termini patent. Widely patent A1 segments.  Anterior communicating artery normal without evidence for aneurysm. ACAs patent to their distal aspects without stenosis. Short-segment mild stenosis at the origin of the left MCA. M1 segments otherwise patent. Probable new severe proximal right M2 stenosis (series 452, image 15). Distal MCA branches well perfused and symmetric. Small vessel irregularity seen bilaterally. POSTERIOR CIRCULATION: Dominant right vertebral artery patent to the vertebrobasilar junction. Left vertebral artery hypoplastic and somewhat irregular but patent as well without flow-limiting stenosis. Patent right PICA. Left PICA not visualized. Anterior inferior cerebral arteries patent bilaterally. Atheromatous regularity within the proximal basilar without flow-limiting stenosis. Basilar is well perfused distally. Superior cerebral arteries patent bilaterally. PCAs supplied via the basilar and are well perfused to their distal aspects without flow-limiting stenosis. Distal small vessel atheromatous irregularity. No aneurysm. IMPRESSION: MRI HEAD IMPRESSION: 1. No acute intracranial abnormality. 2. Moderate for age nonspecific cerebral white matter changes, most likely due to chronic small vessel ischemic disease. 3. Relatively stable anterior parafalcine meningioma without associated edema. 4. Abnormal appearance of the visualized bone marrow, most likely related to history of myeloblastic leukemia.  MRA HEAD IMPRESSION: 1. Motion degraded exam with grossly stable appearance of extensive intracranial atherosclerosis. 2. No large or proximal arterial branch occlusion. No high-grade proximal or correctable stenosis identified. Electronically Signed   By: Jeannine Boga M.D.   On: 11/22/2017 00:38   Mr Virgel Paling TG Contrast  Result Date: 11/22/2017 CLINICAL DATA:  Initial evaluation for acute mid spurring aphasia. EXAM: MRI HEAD WITHOUT CONTRAST MRA HEAD WITHOUT CONTRAST TECHNIQUE: Multiplanar, multiecho pulse sequences of the brain and  surrounding structures were obtained without intravenous contrast. Angiographic images of the head were obtained using MRA technique without contrast. COMPARISON:  Priors CT from earlier the same day as well as previous MRI from 05/24/2014. FINDINGS: MRI HEAD FINDINGS Brain: Generalized age related cerebral atrophy. Patchy and confluent T2/FLAIR hyperintensity within the periventricular and deep white matter both cerebral hemispheres, nonspecific, but most like related chronic small vessel ischemic disease, relatively stable from previous. No abnormal foci of restricted diffusion to suggest acute or subacute ischemia. Gray-white matter differentiation maintained. No encephalomalacia to suggest chronic infarction. No evidence for acute or chronic intracranial hemorrhage. Densely calcified meningioma straddling the anterior falx again seen measuring 14 mm in size, perhaps minimally increased in size from previous. No associated edema or mass effect. No other mass lesion. No midline shift. No hydrocephalus. No extra-axial fluid collection. Major dural sinuses are grossly patent. Pituitary gland suprasellar region normal. Midline structures intact and normal. Vascular: Major intracranial vascular flow voids are maintained. Skull and upper cervical spine: Craniocervical junction normal. Visualized upper cervical spine normal. Visualized bone marrow is diffusely heterogeneous in appearance, relatively similar to previous. No scalp soft tissue abnormality. Sinuses/Orbits: Globes and orbital soft tissues within normal limits. Patient status post cataract extraction bilaterally. Other: Moderate left sphenoid sinus mucosal thickening. Paranasal sinuses are otherwise clear. Trace left mastoid effusion, of doubtful significance. Inner ear structures normal. MRA HEAD FINDINGS ANTERIOR CIRCULATION: Study moderately degraded by motion artifact. Distal cervical segments of the internal carotid arteries are patent with antegrade  flow. Petrous segments widely patent. Atheromatous irregularity throughout the carotid siphons with mild to moderate narrowing on the left, mild narrowing on the right. Origin of the ophthalmic arteries within normal limits. ICA termini patent. Widely patent A1 segments. Anterior communicating artery normal without evidence for aneurysm. ACAs patent to their distal aspects without stenosis. Short-segment mild stenosis at the origin of the left MCA. M1 segments otherwise patent. Probable new severe proximal right M2 stenosis (series 452, image 15). Distal MCA branches well perfused and symmetric. Small vessel irregularity seen bilaterally. POSTERIOR CIRCULATION: Dominant right vertebral artery patent to the vertebrobasilar junction. Left vertebral artery hypoplastic and somewhat irregular but patent as well without flow-limiting stenosis. Patent right PICA. Left PICA not visualized. Anterior inferior cerebral arteries patent bilaterally. Atheromatous regularity within the proximal basilar without flow-limiting stenosis. Basilar is well perfused distally. Superior cerebral arteries patent bilaterally. PCAs supplied via the basilar and are well perfused to their distal aspects without flow-limiting stenosis. Distal small vessel atheromatous irregularity. No aneurysm. IMPRESSION: MRI HEAD IMPRESSION: 1. No acute intracranial abnormality. 2. Moderate for age nonspecific cerebral white matter changes, most likely due to chronic small vessel ischemic disease. 3. Relatively stable anterior parafalcine meningioma without associated edema. 4. Abnormal appearance of the visualized bone marrow, most likely related to history of myeloblastic leukemia. MRA HEAD IMPRESSION: 1. Motion degraded exam with grossly stable appearance of extensive intracranial atherosclerosis. 2. No large or proximal arterial branch occlusion. No high-grade proximal or correctable stenosis identified. Electronically Signed  By: Jeannine Boga M.D.    On: 11/22/2017 00:38      Scheduled Meds: . amLODipine  5 mg Oral Daily  . aspirin  300 mg Rectal Daily   Or  . aspirin  325 mg Oral Daily  . atorvastatin  40 mg Oral q1800  . clopidogrel  75 mg Oral Q breakfast  . heparin  5,000 Units Subcutaneous Q8H  . insulin aspart  0-20 Units Subcutaneous TID WC  . insulin aspart  0-5 Units Subcutaneous QHS  . insulin glargine  60 Units Subcutaneous QHS  . isosorbide mononitrate  30 mg Oral Daily  . levETIRAcetam  500 mg Oral BID  . magnesium oxide  800 mg Oral BID  . metoprolol succinate  100 mg Oral Daily  . mometasone-formoterol  2 puff Inhalation BID   Continuous Infusions:   LOS: 0 days    Time spent in minutes: 35    Debbe Odea, MD [facility] Pager: [email or web address] Password [email or web address] 11/23/2017, 3:26 PM

## 2017-11-23 NOTE — Consult Note (Addendum)
   [facility] CM Inpatient Consult   11/23/2017  [name] March 09, 1941 [id number]    Spoke with [name] at bedside to discuss [place] Management program. She has been active with [facility] RN Telephonic and [place]. Discussed [place] follow up due to multiple hospitalizations. [name] is agreeable and [facility] Care Management written consent obtained. [facility] Care Management packet provided.   [name] lives with her husband.  She states she receives her medications thru [facility] mail order pharmacy. She uses [facility] on [address] if needed.  [name] reports she is still independent and works.  Denies having any issues with transportation or meals.  Confirms Primary Care MD is Dr. Burnice Logan. States best contact number is [phone].  Discussed referral to be made for [facility]. She is agreeable to this.   [name] has history of AML, CKD, DM, CAD, COPD, NSTEMI, PAF with RVR, HTN, HLD.  Will make inpatient RNCM aware that [place] Management to follow post discharge.    Marthenia Rolling, MSN-Ed, RN,BSN [facility] Liaison [phone]

## 2017-11-23 NOTE — Progress Notes (Signed)
Neurology sign off note  Subjective: Patient has not had any further episodes.  She feels that she is not having any side effects on the Keppra.  She does feel that she is slightly sleepier than normal but nothing significant.  She also feels like she is having a little bit of anxiety but nothing significant as she is resting comfortably in bed.   Vitals:   11/23/17 0143 11/23/17 0415  BP: (!) 118/47 (!) 140/56  Pulse: 72 81  Resp: 16 16  Temp: 98.6 F (37 C) 98.2 F (36.8 C)  SpO2: 100% 99%   Gen: In bed, NAD Resp: non-labored breathing, no acute distress Abd: soft, nt  General: NAD Mental Status: Alert, oriented, thought content appropriate.  Speech fluent without evidence of aphasia.  Able to follow 3 step commands without difficulty. Cranial Nerves: II:  Visual fields grossly normal, pupils equal, round, reactive to light and accommodation III,IV, VI: ptosis not present, extra-ocular motions intact bilaterally V,VII: smile symmetric, facial light touch sensation normal bilaterally VIII: hearing normal bilaterally IX,X: uvula rises symmetrically XI: bilateral shoulder shrug XII: midline tongue extension without atrophy or fasciculations Motor: Right : Upper extremity   5/5    Left:     Upper extremity   5/5  Lower extremity   5/5     Lower extremity   5/5 Tone and bulk:normal tone throughout; no atrophy noted Sensory: Pinprick and light touch intact throughout, bilaterally Deep Tendon Reflexes:  Depressed throughout lower extremities with upper extremities have 2+ Plantars: Right: downgoing   Left: downgoing Cerebellar: normal finger-to-nose  Pertinent Labs:  Inconsistent glucose as high as close to 500 and as low as 135. Creatinine 1.55 Calcium 8.1 Triglycerides 222 LDL 55  MRI HEAD IMPRESSION: 1. No acute intracranial abnormality. 2. Moderate for age nonspecific cerebral white matter changes, most likely due to chronic small vessel ischemic disease. 3.  Relatively stable anterior parafalcine meningioma without associated edema. 4. Abnormal appearance of the visualized bone marrow, most likely related to history of myeloblastic leukemia.  MRA HEAD IMPRESSION:  1. Motion degraded exam with grossly stable appearance of extensive intracranial atherosclerosis. 2. No large or proximal arterial branch occlusion. No high-grade proximal or correctable stenosis identified.  LTM EEG report (11/22/2017 at 15 00 to 11/23/2017 at 7:43 AM) Clinical Interpretation:  This 17 hours of intensive EEG monitoring with simultaneous video monitoring recorded 11 stereotypical events of interest marked by aphasia.  Patient was able to identify all of this events of interest by activating the event button.  All of these events of interest were accompanied by ictal pattern lateralizing to the left hemisphere localizing to the left frontotemporal region with broad field and evolving features as discussed above.  This consistent with complex partial seizures involved in left hemisphere particular left frontotemporal region however ictal pattern appears fairly broad across left hemisphere.    Assessment:  76 year old female presenting with intermittent spells of aphasia. Several events were occurred during LTM EEG, with correlating  ictal pattern on EEG localized to the left frontotemporal region.  1.  Clinical and electrographic partial seizures continue despite having been started on 500 mg of Keppra BID. 2. MRI/MRA was negative.  Stroke workup was negative.  Recommendations: 1) Increase Keppra to 1000 mg BID. Administer supplemental load of IV Keppra 500 mg now.  2) LTM EEG has been discontinued for now. Will restart LTM EEG tomorrow after she has been on the increased dose Keppra for 2 doses.  3) Per [facility]  [place] statutes, patients with seizures are not allowed to drive until  they have been seizure-free for six months. Use caution when using heavy equipment or  power tools. Avoid working on ladders or at heights. Take showers instead of baths. Ensure the water temperature is not too high on the home water heater. Do not go swimming alone. When caring for infants or small children, sit down when holding, feeding, or changing them to minimize risk of injury to the child in the event you have a seizure.   Also, Maintain good sleep hygiene. Avoid alcohol.  --> Call 911 and bring the patient back to the ED if:                   A.  The seizure lasts longer than 5 minutes.                  B.  The patient doesn't awaken shortly after the seizure             C.  The patient has new problems such as difficulty seeing, speaking or moving             D.  The patient was injured during the seizure             E.  The patient has a temperature over 102 F (39C)             F.  The patient vomited and now is having trouble breathing  Electronically signed: Dr. Kerney Elbe

## 2017-11-23 NOTE — Progress Notes (Signed)
Inpatient Diabetes Program Recommendations  AACE/ADA: New Consensus Statement on Inpatient Glycemic Control (2015)  Target Ranges:  Prepandial:   less than 140 mg/dL      Peak postprandial:   less than 180 mg/dL (1-2 hours)      Critically ill patients:  140 - 180 mg/dL    Results for ZHARA, GIESKE (MRN [id number]) as of 11/23/2017 12:38  Ref. Range 11/23/2017 00:14 11/23/2017 03:57 11/23/2017 07:42 11/23/2017 11:27  Glucose-Capillary Latest Ref Range: 65 - 99 mg/dL 178 (H) 135 (H) 198 (H) 390 (H)    Home DM Meds: Toujeo 50 units QHS       Humalog 14 units TID       Humalog 4 extra units CBG >200 mg/dl       Humalog 6 extra units CBG > 300 mg/dl  Current Insulin Orders: Lantus 60 units QHS      Novolog Resistant Correction Scale/ SSI (0-20 units) TID AC + HS       MD- Please consider starting Novolog Meal Coverage:  Novolog 7 units TID with meals (hold if pt eats <50% of meal)  This would be 50% total home dose of rapid-acting insulin patient takes at home       --Will follow patient during hospitalization--  Wyn Quaker RN, MSN, CDE Diabetes Coordinator Inpatient Glycemic Control Team Team Pager: [phone] (8a-5p)

## 2017-11-24 ENCOUNTER — Inpatient Hospital Stay (HOSPITAL_COMMUNITY): Payer: Medicare HMO

## 2017-11-24 LAB — GLUCOSE, CAPILLARY
GLUCOSE-CAPILLARY: 297 mg/dL — AB (ref 65–99)
Glucose-Capillary: 148 mg/dL — ABNORMAL HIGH (ref 65–99)
Glucose-Capillary: 324 mg/dL — ABNORMAL HIGH (ref 65–99)
Glucose-Capillary: 162 mg/dL — ABNORMAL HIGH (ref 65–99)

## 2017-11-24 LAB — HEMOGLOBIN A1C
HEMOGLOBIN A1C: 12.4 % — AB (ref 4.8–5.6)
MEAN PLASMA GLUCOSE: 309 mg/dL

## 2017-11-24 NOTE — Progress Notes (Addendum)
Inpatient Diabetes Program Recommendations  AACE/ADA: New Consensus Statement on Inpatient Glycemic Control (2015)  Target Ranges:  Prepandial:   less than 140 mg/dL      Peak postprandial:   less than 180 mg/dL (1-2 hours)      Critically ill patients:  140 - 180 mg/dL   Results for [name], [name] (MRN [id number]) as of 11/24/2017 10:19  Ref. Range 11/22/2017 03:54  Hemoglobin A1C Latest Ref Range: 4.8 - 5.6 % 12.4 (H)   Results for [name] (MRN [id number]) as of 11/24/2017 10:19  Ref. Range 11/23/2017 07:42 11/23/2017 11:27 11/23/2017 16:40 11/23/2017 21:26  Glucose-Capillary Latest Ref Range: 65 - 99 mg/dL 198 (H)  3 units Novolog 390 (H)  20 units Novolog 241 (H)  7 units Novolog 257 (H)  3 units Novolog +   60 units Lantus   Results for [name] (MRN [id number]) as of 11/24/2017 10:19  Ref. Range 11/24/2017 06:19  Glucose-Capillary Latest Ref Range: 65 - 99 mg/dL 148 (H)    Home DM Meds: Toujeo 50 units QHS                             Humalog 14 units TID                             Humalog 4 extra units CBG >200 mg/dl                             Humalog 6 extra units CBG > 300 mg/dl  Current Insulin Orders: Lantus 60 units QHS                                       Novolog Resistant Correction Scale/ SSI (0-20 units) TID AC + HS       MD- Please consider starting Novolog Meal Coverage:  Novolog 7 units TID with meals (hold if pt eats <50% of meal)  This would be 50% total home dose of rapid-acting insulin patient takes at home   Fasting CBG OK.  Would leave Lantus at current dose of 60 units QHS.    Note patient saw her PCP (Dr. Bluford Kaufmann with [facility]) on 10/26/17.  At that visit, patient's insulins were increased to the doses of: Toujeo 50 units QHS Humalog 14 units TID Humalog 4 extra units CBG >200 mg/dl Humalog 6 extra units CBG > 300 mg/dl     Addendum 1:50pm: Met with patient today.  Spoke with patient about her  current A1c of 12.4%.   Explained what an A1c is and what it measures.  Reminded patient that her goal A1c is 7% or less per ADA standards to prevent both acute and long-term complications.  Explained to patient the extreme importance of good glucose control at home.  Encouraged patient to check her CBGs at least TID AC at home and to record all CBGs in a logbook for her PCP to review.  Patient stated to me that her insurance will not cover Humalog and that she has been relying on her PCP's office to give her Humalog samples.  Often skips doses of Humalog at home b/c she does not have enough.  Pays over $100 for her Toujeo Rx as well.  Discussed with patient  that her A1c has steadily risen over the last few months (was 7.7% in October, rose to 9.8% in November, and then now up to 12.4% in December).  Discussed with pt that her A1c is likely elevated due to the fact that she has been skipping Humalog doses at home.  Patient asked me if we could switch her back to oral meds, however, given the fact that her CBGs are so high, I relayed to patient that oral meds alone would likely not be effective to control her CBGs.        --Will follow patient during hospitalization--  Wyn Quaker RN, MSN, CDE Diabetes Coordinator Inpatient Glycemic Control Team Team Pager: [phone] (8a-5p)

## 2017-11-24 NOTE — Progress Notes (Signed)
Subjective: Patient is currently being hooked up to LTM.  Patient has no complaints is able to follow commands.  Patient is not having any speech difficulties.  Exam: Vitals:   11/24/17 0517 11/24/17 0727  BP: (!) 116/44   Pulse: 71 68  Resp: 18 16  Temp: 98.1 F (36.7 C)   SpO2: 97% 96%    HEENT-  Normocephalic, no lesions, without obvious abnormality.  Normal external eye and conjunctiva.  Normal TM's bilaterally.  Normal auditory canals and external ears. Normal external nose, mucus membranes and septum.  Normal pharynx. Cardiovascular- S1, S2 normal, pulses palpable throughout   Lungs- chest clear, no wheezing, rales, normal symmetric air entry Abdomen- normal findings: bowel sounds normal Extremities- no edema  Neuro:  CN: Pupils are equal and round. They are symmetrically reactive from 3-->2 mm. EOMI without nystagmus. Facial sensation is intact to light touch. Face is symmetric at rest with normal strength and mobility. Hearing is intact to conversational voice. Palate elevates symmetrically and uvula is midline. Voice is normal in tone, pitch and quality. Bilateral SCM and trapezii are 5/5. Tongue is midline with normal bulk and mobility.  Motor: Normal bulk, tone, and strength. 5/5 throughout. No drift.  Sensation: Intact to light touch.  DTRs: 2+, symmetric  Toes downgoing bilaterally. No pathologic reflexes.  Coordination: Finger-to-nose and heel-to-shin are without dysmetria   Medications:  Scheduled: . amLODipine  5 mg Oral Daily  . aspirin  300 mg Rectal Daily   Or  . aspirin  325 mg Oral Daily  . atorvastatin  40 mg Oral q1800  . clopidogrel  75 mg Oral Q breakfast  . heparin  5,000 Units Subcutaneous Q8H  . insulin aspart  0-20 Units Subcutaneous TID WC  . insulin aspart  0-5 Units Subcutaneous QHS  . insulin glargine  60 Units Subcutaneous QHS  . isosorbide mononitrate  30 mg Oral Daily  . levETIRAcetam  1,000 mg Oral BID  . magnesium oxide  800 mg Oral BID   . metoprolol succinate  100 mg Oral Daily  . mometasone-formoterol  2 puff Inhalation BID    Pertinent Labs/Diagnostics: EEG: Clinical interpretation: This 17 hours of intensive EEG monitoring with simultaneous video monitoring recorded 11 stereotypical events of interest marked by aphasia.  Patient was able to identify all of this events of interest by activating the event button.  All of these events of interest were accompanied by ictal pattern lateralizing to the left hemisphere localizing to the left frontotemporal region with broad field and evolving features as discussed above.  This consistent with complex partial seizures involved in left hemisphere particular left frontotemporal region however ictal pattern appears fairly broad across left hemisphere.  Clinical correlation is advised.  No results found.   Etta Quill PA-C [facility] [phone]  Impression: 76 year old female presenting with intermittent spells of aphasia.  Several events were seen on recent LTM EEG that did correlate with an ictal pattern.  For this reason patient will be hooked back up to LTM.  Patient has been loaded with an additional Keppra 500 mg and Keppra has been increased to 1000 mg twice daily.  We will continue to follow patient and make further decisions based on LTM.   Recommendations: 1) Restart LTM and monitor for improvement of EEG pattern with increased Keppra dosing. Clinically improving.  2) Continue antiepileptic drugs at current dose.  Will make corrections based on LTM findings 3) Per [facility] statutes, patients with seizures are not allowed to  drive until  they have been seizure-free for six months. Use caution when using heavy equipment or power tools. Avoid working on ladders or at heights. Take showers instead of baths. Ensure the water temperature is not too high on the home water heater. Do not go swimming alone. When caring for infants or small children, sit down  when holding, feeding, or changing them to minimize risk of injury to the child in the event you have a seizure.   Also, Maintain good sleep hygiene. Avoid alcohol.  --> Call 911 and bring the patient back to the ED if:                   A.  The seizure lasts longer than 5 minutes.                  B.  The patient doesn't awaken shortly after the seizure             C.  The patient has new problems such as difficulty seeing, speaking or moving             D.  The patient was injured during the seizure             E.  The patient has a temperature over 102 F (39C)             F.  The patient vomited and now is having trouble breathing   Electronically signed: Dr. Kerney Elbe 11/24/2017, 10:22 AM

## 2017-11-24 NOTE — Progress Notes (Signed)
LTM set up again per Dr Cheral Marker; educated pt and nurse on the event button. Notified Dr Deborah Chalk of LTM.

## 2017-11-24 NOTE — Progress Notes (Signed)
PROGRESS NOTE    [name]  MVH:[id number] DOB: [birth year] DOA: 11/21/2017 PCP: Marletta Lor, MD   Chief Complaint  Patient presents with  . Cerebrovascular Accident    Brief Narrative:  HPI On 11/21/2017 by Dr. Christia Reading Opyd [name] is a 76 y.o. female with medical history significant for acute myeloblastic leukemia, chronic kidney disease stage IV, chronic systolic CHF, coronary artery disease, insulin-dependent diabetes mellitus, and hypertension, now presenting to the emergency department with difficulty communicating.  Patient reports that yesterday afternoon at approximately 3 PM, she was unable to write a check, describing difficulty spelling and knowing what to write where.  She went on to have a fairly unremarkable evening, but today, had difficulty "getting the words out" in the morning, and this persisted throughout the day.  No recent fall or trauma.  No alcohol or substance use, and no headache, chest pain, or focal numbness or weakness reported.  Assessment & Plan   Expressive aphasia -CT head: Stable meningioma along the anterior falx -MRI/MRA: Negative for acute CVA. Positive for extensive intracranial atherosclerosis. -Patient multiple episodes of symptoms which could be related to TIAs versus seizures -speech does appear to be improving -EEG pending -Neurology consulted and appreciated -Continue Keppra  Coronary artery disease -Currently chest pain-free -Status post DES RCA stent in October 2018 -Continue statin, aspirin, Plavix  Questionable history of atrial flutter -Apparently had episode in 2015 -Anticoagulation was deferred to finding of pancreatic mass -No further episodes of atrial fibrillation or flutter was seen with documented cardiology notes since then  Diabetes mellitus, type II -Hemoglobin A1c 9.8 on 10/23/2017 -Continue Lantus, insulin sliding scale CBG monitoring  Essential hypertension/chronic diastolic and systolic heart  failure -continue metoprolol, imdur, amlodipine -lasix at home   Acute kidney injury on chronic kidney disease, stage III -lasix currently held -creatinine peaked 2.16, baseline creatinine 1.5 -creatinine currently 1.55 -continue to monitor BMP  AML -Per patient, in remission  COPD -Currently stable, no wheezing on exam  DVT Prophylaxis  heparin  Code Status: Full  Family Communication: None at bedside  Disposition Plan: Admitted. Pending 24-hour EEG and further neurology recommendations  Consultants Neurology  Procedures  EEG  Antibiotics   Anti-infectives (From admission, onward)   None      Subjective:   [name] seen and examined today.  Patient has no complaints today. Denies chest pain, shortness of breath, abdominal pain, N/V/D/C.  Objective:   Vitals:   11/24/17 0727 11/24/17 0803 11/24/17 1021 11/24/17 1447  BP:   (!) 132/40 135/60  Pulse: 68  62 70  Resp: 16  20 20   Temp:   98 F (36.7 C) 98 F (36.7 C)  TempSrc:   Oral Oral  SpO2: 96%  95% 99%  Weight:  101.9 kg (224 lb 10.4 oz)    Height:        Intake/Output Summary (Last 24 hours) at 11/24/2017 1527 Last data filed at 11/24/2017 1447 Gross per 24 hour  Intake 1185 ml  Output -  Net 1185 ml   Filed Weights   11/22/17 0436 11/24/17 0803  Weight: 99.8 kg (220 lb) 101.9 kg (224 lb 10.4 oz)    Exam  General: Well developed, well nourished, NAD, appears stated age  HEENT: NCAT, mucous membranes moist.   Cardiovascular: S1 S2 auscultated, no murmurs, RRR  Respiratory: Clear to auscultation bilaterally with equal chest rise  Abdomen: Soft, obese, nontender, nondistended, + bowel sounds  Extremities: warm dry without cyanosis clubbing or  edema  Neuro: AAOx3, nonfocal  Psych: Appropriate mood and affect, pleasant    Data Reviewed: I have personally reviewed following labs and imaging studies  CBC: Recent Labs  Lab 11/21/17 1852 11/21/17 1909  WBC 7.2  --   NEUTROABS  5.0  --   HGB 11.6* 11.9*  HCT 35.0* 35.0*  MCV 90.9  --   PLT 145*  --    Basic Metabolic Panel: Recent Labs  Lab 11/21/17 1852 11/21/17 1909 11/23/17 0335  NA 129* 133* 137  K 4.0 4.1 3.8  CL 96* 95* 105  CO2 22  --  24  GLUCOSE 478* 474* 137*  BUN 27* 26* 23*  CREATININE 2.16* 2.00* 1.55*  CALCIUM 8.3*  --  8.1*   GFR: Estimated Creatinine Clearance: 35.1 mL/min (A) (by C-G formula based on SCr of 1.55 mg/dL (H)). Liver Function Tests: Recent Labs  Lab 11/21/17 1852  AST 20  ALT 17  ALKPHOS 159*  BILITOT 0.7  PROT 5.8*  ALBUMIN 3.1*   No results for input(s): LIPASE, AMYLASE in the last 168 hours. No results for input(s): AMMONIA in the last 168 hours. Coagulation Profile: Recent Labs  Lab 11/21/17 1852  INR 1.02   Cardiac Enzymes: No results for input(s): CKTOTAL, CKMB, CKMBINDEX, TROPONINI in the last 168 hours. BNP (last 3 results) No results for input(s): PROBNP in the last 8760 hours. HbA1C: Recent Labs    11/22/17 0354  HGBA1C 12.4*   CBG: Recent Labs  Lab 11/23/17 1127 11/23/17 1640 11/23/17 2126 11/24/17 0619 11/24/17 1125  GLUCAP 390* 241* 257* 148* 297*   Lipid Profile: Recent Labs    11/22/17 0354  CHOL 127  HDL 28*  LDLCALC 55  TRIG 222*  CHOLHDL 4.5   Thyroid Function Tests: No results for input(s): TSH, T4TOTAL, FREET4, T3FREE, THYROIDAB in the last 72 hours. Anemia Panel: No results for input(s): VITAMINB12, FOLATE, FERRITIN, TIBC, IRON, RETICCTPCT in the last 72 hours. Urine analysis:    Component Value Date/Time   COLORURINE STRAW (A) 08/24/2017 0247   APPEARANCEUR CLEAR 08/24/2017 0247   LABSPEC 1.009 08/24/2017 0247   LABSPEC 1.015 08/04/2014 1439   PHURINE 5.0 08/24/2017 0247   GLUCOSEU >=500 (A) 08/24/2017 0247   GLUCOSEU Negative 08/04/2014 1439   HGBUR NEGATIVE 08/24/2017 0247   HGBUR negative 10/10/2010 1607   BILIRUBINUR NEGATIVE 08/24/2017 0247   BILIRUBINUR Negative 08/04/2014 1439   KETONESUR  NEGATIVE 08/24/2017 0247   PROTEINUR NEGATIVE 08/24/2017 0247   UROBILINOGEN 0.2 08/04/2014 1439   NITRITE NEGATIVE 08/24/2017 0247   LEUKOCYTESUR NEGATIVE 08/24/2017 0247   LEUKOCYTESUR Negative 08/04/2014 1439   Sepsis Labs: @LABRCNTIP (procalcitonin:4,lacticidven:4)  )No results found for this or any previous visit (from the past 240 hour(s)).    Radiology Studies: No results found.   Scheduled Meds: . amLODipine  5 mg Oral Daily  . aspirin  300 mg Rectal Daily   Or  . aspirin  325 mg Oral Daily  . atorvastatin  40 mg Oral q1800  . clopidogrel  75 mg Oral Q breakfast  . heparin  5,000 Units Subcutaneous Q8H  . insulin aspart  0-20 Units Subcutaneous TID WC  . insulin aspart  0-5 Units Subcutaneous QHS  . insulin glargine  60 Units Subcutaneous QHS  . isosorbide mononitrate  30 mg Oral Daily  . levETIRAcetam  1,000 mg Oral BID  . magnesium oxide  800 mg Oral BID  . metoprolol succinate  100 mg Oral Daily  . mometasone-formoterol  2  puff Inhalation BID   Continuous Infusions:   LOS: 1 day   Time Spent in minutes   30 minutes  [place]jan Grove D.O. on 11/24/2017 at 3:27 PM  Between 7am to 7pm - Pager - [phone]  After 7pm go to [email or web address] - password [email or web address]  And look for the night coverage person covering for me after hours  [facility] Office  [phone]

## 2017-11-25 DIAGNOSIS — I252 Old myocardial infarction: Secondary | ICD-10-CM

## 2017-11-25 DIAGNOSIS — Z8673 Personal history of transient ischemic attack (TIA), and cerebral infarction without residual deficits: Secondary | ICD-10-CM

## 2017-11-25 LAB — CBC
HEMATOCRIT: 32.1 % — AB (ref 36.0–46.0)
HEMOGLOBIN: 10.3 g/dL — AB (ref 12.0–15.0)
MCH: 29.9 pg (ref 26.0–34.0)
MCHC: 32.1 g/dL (ref 30.0–36.0)
MCV: 93.3 fL (ref 78.0–100.0)
Platelets: 136 10*3/uL — ABNORMAL LOW (ref 150–400)
RBC: 3.44 MIL/uL — AB (ref 3.87–5.11)
RDW: 14 % (ref 11.5–15.5)
WBC: 5.9 10*3/uL (ref 4.0–10.5)

## 2017-11-25 LAB — BASIC METABOLIC PANEL
ANION GAP: 8 (ref 5–15)
BUN: 25 mg/dL — AB (ref 6–20)
CHLORIDE: 105 mmol/L (ref 101–111)
CO2: 24 mmol/L (ref 22–32)
Calcium: 8.4 mg/dL — ABNORMAL LOW (ref 8.9–10.3)
Creatinine, Ser: 1.55 mg/dL — ABNORMAL HIGH (ref 0.44–1.00)
GFR, EST AFRICAN AMERICAN: 37 mL/min — AB (ref >60)
GFR, EST NON AFRICAN AMERICAN: 32 mL/min — AB (ref >60)
Glucose, Bld: 243 mg/dL — ABNORMAL HIGH (ref 65–99)
POTASSIUM: 5 mmol/L (ref 3.5–5.1)
SODIUM: 137 mmol/L (ref 135–145)

## 2017-11-25 LAB — GLUCOSE, CAPILLARY
GLUCOSE-CAPILLARY: 285 mg/dL — AB (ref 65–99)
GLUCOSE-CAPILLARY: 238 mg/dL — AB (ref 65–99)
Glucose-Capillary: 224 mg/dL — ABNORMAL HIGH (ref 65–99)

## 2017-11-25 MED ORDER — HEPARIN SOD (PORK) LOCK FLUSH 100 UNIT/ML IV SOLN
500.0000 [IU] | INTRAVENOUS | Status: AC | PRN
  Administered 2017-11-25: 500 [IU]

## 2017-11-25 MED ORDER — LEVETIRACETAM 1000 MG PO TABS: 1000.0000 mg | ORAL_TABLET | Freq: Two times a day (BID) | ORAL | 0 refills | Status: DC

## 2017-11-25 MED ORDER — INSULIN REGULAR HUMAN 100 UNIT/ML IJ SOLN: INTRAMUSCULAR | 1 refills | Status: DC

## 2017-11-25 MED ORDER — "PEN NEEDLES 3/16"" 31G X 5 MM MISC": 0 refills | Status: DC

## 2017-11-25 NOTE — Progress Notes (Signed)
Subjective: No clinical events overnight.  Patient did push the button twice for right hand numbness. EEG showed no electrographic correlate. She stated there was no speech arrest. At this time she is resting comfortably alert oriented with no speech problems.  Exam: Vitals:   11/25/17 0007 11/25/17 0508  BP: (!) 118/48 (!) 137/43  Pulse: 75 71  Resp: 20 20  Temp: 98.1 F (36.7 C) 97.7 F (36.5 C)  SpO2: 95% 100%    HEENT-  Normocephalic, no lesions, without obvious abnormality.  Normal external eye and conjunctiva.  Normal TM's bilaterally.  Normal auditory canals and external ears. Normal external nose, mucus membranes and septum.  Normal pharynx. Cardiovascular- S1, S2 normal, pulses palpable throughout   Lungs- chest clear, no wheezing, rales, normal symmetric air entry Abdomen- normal findings: bowel sounds normal Extremities- no edema Lymph-no adenopathy palpable Musculoskeletal-no joint tenderness, deformity or swelling Skin-warm and dry, no hyperpigmentation, vitiligo, or suspicious lesions   Neuro:  Ment: Speech intact without aphasia. Alert and oriented x 3. CN: Pupils are equal and round. They are symmetrically reactive from 3-->2 mm. EOMI without nystagmus. Facial sensation is intact to light touch. Face is symmetric at rest with normal strength and mobility. Hearing is intact to conversation. Palate elevates symmetrically and uvula is midline. Voice is normal in tone, pitch and quality. Bilateral SCM and trapezii are 5/5. Tongue is midline with normal bulk and mobility.  Motor: Normal bulk, tone, and strength. 5/5 throughout. No drift.  Sensation: Intact to light touch.    Medications:  Scheduled: . amLODipine  5 mg Oral Daily  . aspirin  300 mg Rectal Daily   Or  . aspirin  325 mg Oral Daily  . atorvastatin  40 mg Oral q1800  . clopidogrel  75 mg Oral Q breakfast  . heparin  5,000 Units Subcutaneous Q8H  . insulin aspart  0-20 Units Subcutaneous TID WC  .  insulin aspart  0-5 Units Subcutaneous QHS  . insulin glargine  60 Units Subcutaneous QHS  . isosorbide mononitrate  30 mg Oral Daily  . levETIRAcetam  1,000 mg Oral BID  . magnesium oxide  800 mg Oral BID  . metoprolol succinate  100 mg Oral Daily  . mometasone-formoterol  2 puff Inhalation BID   Continuous:   Pertinent Labs/Diagnostics: EEG Day 2: Two PB events unclear if accidents or events, patient was not tested.  No EEG correlate with these PB events. No clinical or subclinical seizures. Occasional left FT slowing interictally.  Clinical interpretation: This day 2 of  intensive EEG monitoring with simultaneous video monitoring did not demonstrate any clinical or subclinical seizures. Two PB activation events without eeg changes. If these were activated by the patient due to aura, these events  could be related to SPS and might not be accompanied by EEG changes. Clinical correlation is advised.    Etta Quill PA-C [facility] [phone]  Impression: 76 year old female presenting with intermittent spells of aphasia.  Several events were seen on her first LTM EEG that did correlate with an ictal pattern; this was while she was on 500 mg BID of Keppra.  For this reason patient was hooked back up to LTM after Keppra dosage was increased to 1000 mg BID. Second LTM reading without electrographic seizures.    Recommendations: 1) Continue Keppra at 1000 mg IV or PO BID.  2) Discontinue LTM EEG 3) Per [facility] statutes, patients with seizures are not allowed to drive until  they have been  seizure-free for six months. Use caution when using heavy equipment or power tools. Avoid working on ladders or at heights. Take showers instead of baths. Ensure the water temperature is not too high on the home water heater. Do not go swimming alone. When caring for infants or small children, sit down when holding, feeding, or changing them to minimize risk of injury to the child in  the event you have a seizure.  4) Neurology will sign off. Please call if there are additional questions.   Electronically signed: Dr. Kerney Elbe 11/25/2017, 10:05 AM

## 2017-11-25 NOTE — Progress Notes (Signed)
CM consulted for medication assistance. Benefits check revealed:   JASMINE  @ [place] [employer] # [phone]   1. NOVOLOG 100 U/ML  COVER- YES  CO-PAY- $ 1,856.31  TIER- 3 DRUG  PRIOR APPROVAL- NO   2. LANTUS 100 U /ML  COVER- YES  CO-PAY- $ 320.26  TIER- 3 DRUG  PRIOR APPROVAL- NO   3. LEVEMIR 100 U / ML  COVER- YES  CO-PAY- $1,951.00  TIER- 3 DRUG  PRIOR APPROVAL- NO   DEDUCTIBLE- NOT MET   PREFERRED PHARMACY : [facility]  AND [facility]

## 2017-11-25 NOTE — Discharge Summary (Signed)
Physician Discharge Summary  [name] YQI:[id number] DOB: [birth year] DOA: 11/21/2017  PCP: Marletta Lor, MD  Admit date: 11/21/2017 Discharge date: 11/25/2017  Time spent: 45 minutes  Recommendations for Outpatient Follow-up:  Patient will be discharged to home.  Patient will need to follow up with primary care provider within one week of discharge.  Follow up with neurology. Patient should continue medications as prescribed.  Patient should follow a heart healthy/carb modified diet. Do not drive.   Discharge Diagnoses:  Expressive aphasia Coronary artery disease Questionable history of atrial flutter Diabetes mellitus, type II Essential hypertension/chronic diastolic and systolic heart failure Acute kidney injury on chronic kidney disease, stage III AML COPD  Discharge Condition: Stable   Diet recommendation: heart healthy/carb modified   Filed Weights   11/22/17 0436 11/24/17 0803  Weight: 99.8 kg (220 lb) 101.9 kg (224 lb 10.4 oz)    History of present illness:  On 11/21/2017 by Dr. Mitzi Hansen [name] Brownis a 76 y.o.femalewith medical history significant foracute myeloblastic leukemia, chronic kidney disease stage IV, chronic systolic CHF, coronary artery disease, insulin-dependent diabetes mellitus, and hypertension, now presenting to the emergency department with difficulty communicating. Patient reports that yesterday afternoon at approximately 3 PM, she was unable to write a check, describing difficulty spelling and knowing what to write where. She went on to have a fairly unremarkable evening, but today, had difficulty "getting the words out" in the morning, and this persisted throughout the day. No recent fall or trauma. No alcohol or substance use, and no headache, chest pain, or focal numbness or weakness reported.  Hospital Course:  Expressive aphasia/Possible seizure -CT head: Stable meningioma along the anterior falx -MRI/MRA: Negative for  acute CVA. Positive for extensive intracranial atherosclerosis. -Patient multiple episodes of symptoms which could be related to TIAs versus seizures -speech does appear to be improving -Had continuous EEG - showed no electrographic correlates -Neurology consulted and appreciated- recommended continuing Keppra and  -Continue Keppra 1000mg  BID  Coronary artery disease -Currently chest pain-free -Status post DES RCA stent in October 2018 -Continue statin, aspirin, Plavix  Questionable history of atrial flutter -Apparently had episode in 2015 -Anticoagulation was deferred to finding of pancreatic mass -No further episodes of atrial fibrillation or flutter was seen with documented cardiology notes since then  Diabetes mellitus, type II -Hemoglobin A1c 9.8 on 10/23/2017 -Continue Toujeo, Novolin R (instead of humalog as she cannot afford this medication) -follow up closely with PCP  Essential hypertension/chronic diastolic and systolic heart failure -continue metoprolol, imdur, amlodipine -lasix at home   Acute kidney injury on chronic kidney disease, stage III -lasix currently held -creatinine peaked 2.16, baseline creatinine 1.5 -creatinine currently 1.55 -continue to monitor BMP  AML -Per patient, in remission  COPD -Currently stable, no wheezing on exam  Consultants Neurology  Procedures  EEG  Discharge Exam: Vitals:   11/25/17 1023 11/25/17 1344  BP: 125/62 (!) 108/41  Pulse: 75 66  Resp:    Temp: 97.6 F (36.4 C) 97.8 F (36.6 C)  SpO2: 95% 98%     General: Well developed, well nourished, NAD, appears stated age  HEENT: NCAT, mucous membranes moist.  Cardiovascular: S1 S2 auscultated, RRR, no murmurs  Respiratory: Clear to auscultation bilaterally with equal chest rise  Abdomen: Soft, nontender, nondistended, + bowel sounds  Extremities: warm dry without cyanosis clubbing or edema  Neuro: AAOx3, nonfocal  Psych:Appropriate mood and  affect  Discharge Instructions Discharge Instructions    AMB Referral to [place] [facility]  Complete by:  As directed    Please assign [place] member to [facility] for transition of care. Has had multiple hospitalizations. [facility] Pharmacist to please complete a 30 day post-discharge medication reconciliation on this patient to help lower risk of readmission. Written consent obtained. Currently at [facility]. Please call with questions. Thanks. Marthenia Rolling, [place], [facility] Liaison-[phone]   Reason for consult:  Please assign to [facility] [facility] Pharmacist   Expected date of contact:  1-3 days (reserved for hospital discharges)   Discharge instructions   Complete by:  As directed    Patient will be discharged to home.  Patient will need to follow up with primary care provider within one week of discharge.  Follow up with neurology. Patient should continue medications as prescribed.  Patient should follow a heart healthy/carb modified diet. Do not drive.   Per [facility] statutes, patients with seizures are not allowed to drive until  they have been seizure-free for six months. Use caution when using heavy equipment or power tools. Avoid working on ladders or at heights. Take showers instead of baths. Ensure the water temperature is not too high on the home water heater. Do not go swimming alone. When caring for infants or small children, sit down when holding, feeding, or changing them to minimize risk of injury to the child in the event you have a seizure.     Allergies as of 11/25/2017   No Known Allergies     Medication List    STOP taking these medications   insulin lispro 100 UNIT/ML KiwkPen Commonly known as:  HUMALOG KWIKPEN   lisinopril 10 MG tablet Commonly known as:  PRINIVIL,ZESTRIL     TAKE these medications   amLODipine 5 MG tablet Commonly known as:  NORVASC TAKE 1 TABLET (5 MG TOTAL) BY MOUTH DAILY.   aspirin 81 MG  chewable tablet Chew 1 tablet (81 mg total) by mouth daily.   atorvastatin 40 MG tablet Commonly known as:  LIPITOR Take 1 tablet (40 mg total) by mouth daily.   clopidogrel 75 MG tablet Commonly known as:  PLAVIX Take 1 tablet (75 mg total) by mouth daily with breakfast. What changed:  when to take this   fluticasone furoate-vilanterol 200-25 MCG/INH Aepb Commonly known as:  BREO ELLIPTA Inhale 1 puff into the lungs daily. What changed:    when to take this  reasons to take this   furosemide 40 MG tablet Commonly known as:  LASIX Take 1 tablet (40 mg total) by mouth daily.   HYDROcodone-acetaminophen 5-325 MG tablet Commonly known as:  NORCO/VICODIN Take 1 tablet every 6 (six) hours as needed by mouth for moderate pain.   Insulin Glargine 300 UNIT/ML Sopn Commonly known as:  TOUJEO SOLOSTAR Inject 50 Units at bedtime into the skin.   insulin regular 100 units/mL injection Commonly known as:  NOVOLIN R RELION Take 14 units TID. Take 4 extra units if CBG >200. Take 6 extra untis if CBG >300   isosorbide mononitrate 30 MG 24 hr tablet Commonly known as:  IMDUR Take 1 tablet (30 mg total) by mouth daily. What changed:    when to take this  reasons to take this   levETIRAcetam 1000 MG tablet Commonly known as:  KEPPRA Take 1 tablet (1,000 mg total) by mouth 2 (two) times daily.   LORazepam 0.5 MG tablet Commonly known as:  ATIVAN Take 1 tablet (0.5 mg total) every 6 (six) hours  as needed by mouth for anxiety.   magnesium oxide 400 (241.3 Mg) MG tablet Commonly known as:  MAG-OX Take 2 tablets (800 mg total) by mouth 2 (two) times daily.   metoprolol succinate 100 MG 24 hr tablet Commonly known as:  TOPROL XL Take 1 tablet (100 mg total) by mouth daily. Take with or immediately following a meal.   mometasone-formoterol 200-5 MCG/ACT Aero Commonly known as:  DULERA Inhale 2 puffs into the lungs 2 (two) times daily. What changed:    when to take  this  reasons to take this   nitroGLYCERIN 0.4 MG SL tablet Commonly known as:  NITROSTAT Place 1 tablet (0.4 mg total) under the tongue every 5 (five) minutes as needed for chest pain.   Pen Needles 3/16" 31G X 5 MM Misc Use with Novolin R   polyethylene glycol powder powder Commonly known as:  GLYCOLAX/MIRALAX Take 17 grams by mouth in 8 ounces of water one to two times daily for constipation.   promethazine 12.5 MG tablet Commonly known as:  PHENERGAN Take 2 tablets (25 mg total) by mouth every 6 (six) hours as needed for nausea.      No Known Allergies    The results of significant diagnostics from this hospitalization (including imaging, microbiology, ancillary and laboratory) are listed below for reference.    Significant Diagnostic Studies: Ct Head Wo Contrast  Result Date: 11/21/2017 CLINICAL DATA:  Slurred speech EXAM: CT HEAD WITHOUT CONTRAST TECHNIQUE: Contiguous axial images were obtained from the base of the skull through the vertex without intravenous contrast. COMPARISON:  05/29/2015 FINDINGS: Brain: Calcified meningioma is again noted along the anterior falx, stable. Mild age related volume loss. Chronic small vessel disease throughout the deep white matter, stable. No acute intracranial abnormality. Specifically, no hemorrhage, hydrocephalus, acute infarction, or significant intracranial injury. Vascular: No hyperdense vessel or unexpected calcification. Skull: No acute calvarial abnormality. Sinuses/Orbits: Visualized paranasal sinuses and mastoids clear. Orbital soft tissues unremarkable. Other: None IMPRESSION: No acute intracranial abnormality. Atrophy, chronic microvascular disease. Stable meningioma along the anterior falx. Electronically Signed   By: Rolm Baptise M.D.   On: 11/21/2017 19:17   Mr Brain Wo Contrast  Result Date: 11/22/2017 CLINICAL DATA:  Initial evaluation for acute mid spurring aphasia. EXAM: MRI HEAD WITHOUT CONTRAST MRA HEAD WITHOUT  CONTRAST TECHNIQUE: Multiplanar, multiecho pulse sequences of the brain and surrounding structures were obtained without intravenous contrast. Angiographic images of the head were obtained using MRA technique without contrast. COMPARISON:  P[facility] CT from earlier the same day as well as previous MRI from 05/24/2014. FINDINGS: MRI HEAD FINDINGS Brain: Generalized age related cerebral atrophy. Patchy and confluent T2/FLAIR hyperintensity within the periventricular and deep white matter both cerebral hemispheres, nonspecific, but most like related chronic small vessel ischemic disease, relatively stable from previous. No abnormal foci of restricted diffusion to suggest acute or subacute ischemia. Gray-white matter differentiation maintained. No encephalomalacia to suggest chronic infarction. No evidence for acute or chronic intracranial hemorrhage. Densely calcified meningioma straddling the anterior falx again seen measuring 14 mm in size, perhaps minimally increased in size from previous. No associated edema or mass effect. No other mass lesion. No midline shift. No hydrocephalus. No extra-axial fluid collection. Major dural sinuses are grossly patent. Pituitary gland suprasellar region normal. Midline structures intact and normal. Vascular: Major intracranial vascular flow voids are maintained. Skull and upper cervical spine: Craniocervical junction normal. Visualized upper cervical spine normal. Visualized bone marrow is diffusely heterogeneous in appearance, relatively similar to previous.  No scalp soft tissue abnormality. Sinuses/Orbits: Globes and orbital soft tissues within normal limits. Patient status post cataract extraction bilaterally. Other: Moderate left sphenoid sinus mucosal thickening. Paranasal sinuses are otherwise clear. Trace left mastoid effusion, of doubtful significance. Inner ear structures normal. MRA HEAD FINDINGS ANTERIOR CIRCULATION: Study moderately degraded by motion artifact. Distal  cervical segments of the internal carotid arteries are patent with antegrade flow. Petrous segments widely patent. Atheromatous irregularity throughout the carotid siphons with mild to moderate narrowing on the left, mild narrowing on the right. Origin of the ophthalmic arteries within normal limits. ICA termini patent. Widely patent A1 segments. Anterior communicating artery normal without evidence for aneurysm. ACAs patent to their distal aspects without stenosis. Short-segment mild stenosis at the origin of the left MCA. M1 segments otherwise patent. Probable new severe proximal right M2 stenosis (series 452, image 15). Distal MCA branches well perfused and symmetric. Small vessel irregularity seen bilaterally. POSTERIOR CIRCULATION: Dominant right vertebral artery patent to the vertebrobasilar junction. Left vertebral artery hypoplastic and somewhat irregular but patent as well without flow-limiting stenosis. Patent right PICA. Left PICA not visualized. Anterior inferior cerebral arteries patent bilaterally. Atheromatous regularity within the proximal basilar without flow-limiting stenosis. Basilar is well perfused distally. Superior cerebral arteries patent bilaterally. PCAs supplied via the basilar and are well perfused to their distal aspects without flow-limiting stenosis. Distal small vessel atheromatous irregularity. No aneurysm. IMPRESSION: MRI HEAD IMPRESSION: 1. No acute intracranial abnormality. 2. Moderate for age nonspecific cerebral white matter changes, most likely due to chronic small vessel ischemic disease. 3. Relatively stable anterior parafalcine meningioma without associated edema. 4. Abnormal appearance of the visualized bone marrow, most likely related to history of myeloblastic leukemia. MRA HEAD IMPRESSION: 1. Motion degraded exam with grossly stable appearance of extensive intracranial atherosclerosis. 2. No large or proximal arterial branch occlusion. No high-grade proximal or  correctable stenosis identified. Electronically Signed   By: Jeannine Boga M.D.   On: 11/22/2017 00:38   Mr Virgel Paling HA Contrast  Result Date: 11/22/2017 CLINICAL DATA:  Initial evaluation for acute mid spurring aphasia. EXAM: MRI HEAD WITHOUT CONTRAST MRA HEAD WITHOUT CONTRAST TECHNIQUE: Multiplanar, multiecho pulse sequences of the brain and surrounding structures were obtained without intravenous contrast. Angiographic images of the head were obtained using MRA technique without contrast. COMPARISON:  Priors CT from earlier the same day as well as previous MRI from 05/24/2014. FINDINGS: MRI HEAD FINDINGS Brain: Generalized age related cerebral atrophy. Patchy and confluent T2/FLAIR hyperintensity within the periventricular and deep white matter both cerebral hemispheres, nonspecific, but most like related chronic small vessel ischemic disease, relatively stable from previous. No abnormal foci of restricted diffusion to suggest acute or subacute ischemia. Gray-white matter differentiation maintained. No encephalomalacia to suggest chronic infarction. No evidence for acute or chronic intracranial hemorrhage. Densely calcified meningioma straddling the anterior falx again seen measuring 14 mm in size, perhaps minimally increased in size from previous. No associated edema or mass effect. No other mass lesion. No midline shift. No hydrocephalus. No extra-axial fluid collection. Major dural sinuses are grossly patent. Pituitary gland suprasellar region normal. Midline structures intact and normal. Vascular: Major intracranial vascular flow voids are maintained. Skull and upper cervical spine: Craniocervical junction normal. Visualized upper cervical spine normal. Visualized bone marrow is diffusely heterogeneous in appearance, relatively similar to previous. No scalp soft tissue abnormality. Sinuses/Orbits: Globes and orbital soft tissues within normal limits. Patient status post cataract extraction  bilaterally. Other: Moderate left sphenoid sinus mucosal thickening. Paranasal sinuses are otherwise  clear. Trace left mastoid effusion, of doubtful significance. Inner ear structures normal. MRA HEAD FINDINGS ANTERIOR CIRCULATION: Study moderately degraded by motion artifact. Distal cervical segments of the internal carotid arteries are patent with antegrade flow. Petrous segments widely patent. Atheromatous irregularity throughout the carotid siphons with mild to moderate narrowing on the left, mild narrowing on the right. Origin of the ophthalmic arteries within normal limits. ICA termini patent. Widely patent A1 segments. Anterior communicating artery normal without evidence for aneurysm. ACAs patent to their distal aspects without stenosis. Short-segment mild stenosis at the origin of the left MCA. M1 segments otherwise patent. Probable new severe proximal right M2 stenosis (series 452, image 15). Distal MCA branches well perfused and symmetric. Small vessel irregularity seen bilaterally. POSTERIOR CIRCULATION: Dominant right vertebral artery patent to the vertebrobasilar junction. Left vertebral artery hypoplastic and somewhat irregular but patent as well without flow-limiting stenosis. Patent right PICA. Left PICA not visualized. Anterior inferior cerebral arteries patent bilaterally. Atheromatous regularity within the proximal basilar without flow-limiting stenosis. Basilar is well perfused distally. Superior cerebral arteries patent bilaterally. PCAs supplied via the basilar and are well perfused to their distal aspects without flow-limiting stenosis. Distal small vessel atheromatous irregularity. No aneurysm. IMPRESSION: MRI HEAD IMPRESSION: 1. No acute intracranial abnormality. 2. Moderate for age nonspecific cerebral white matter changes, most likely due to chronic small vessel ischemic disease. 3. Relatively stable anterior parafalcine meningioma without associated edema. 4. Abnormal appearance of the  visualized bone marrow, most likely related to history of myeloblastic leukemia. MRA HEAD IMPRESSION: 1. Motion degraded exam with grossly stable appearance of extensive intracranial atherosclerosis. 2. No large or proximal arterial branch occlusion. No high-grade proximal or correctable stenosis identified. Electronically Signed   By: Jeannine Boga M.D.   On: 11/22/2017 00:38    Microbiology: No results found for this or any previous visit (from the past 240 hour(s)).   Labs: Basic Metabolic Panel: Recent Labs  Lab 11/21/17 1852 11/21/17 1909 11/23/17 0335 11/25/17 0412  NA 129* 133* 137 137  K 4.0 4.1 3.8 5.0  CL 96* 95* 105 105  CO2 22  --  24 24  GLUCOSE 478* 474* 137* 243*  BUN 27* 26* 23* 25*  CREATININE 2.16* 2.00* 1.55* 1.55*  CALCIUM 8.3*  --  8.1* 8.4*   Liver Function Tests: Recent Labs  Lab 11/21/17 1852  AST 20  ALT 17  ALKPHOS 159*  BILITOT 0.7  PROT 5.8*  ALBUMIN 3.1*   No results for input(s): LIPASE, AMYLASE in the last 168 hours. No results for input(s): AMMONIA in the last 168 hours. CBC: Recent Labs  Lab 11/21/17 1852 11/21/17 1909 11/25/17 0412  WBC 7.2  --  5.9  NEUTROABS 5.0  --   --   HGB 11.6* 11.9* 10.3*  HCT 35.0* 35.0* 32.1*  MCV 90.9  --  93.3  PLT 145*  --  136*   Cardiac Enzymes: No results for input(s): CKTOTAL, CKMB, CKMBINDEX, TROPONINI in the last 168 hours. BNP: BNP (last 3 results) Recent Labs    08/24/17 0130  BNP 357.9*    ProBNP (last 3 results) No results for input(s): PROBNP in the last 8760 hours.  CBG: Recent Labs  Lab 11/24/17 1125 11/24/17 1543 11/24/17 2105 11/25/17 0640 11/25/17 1132  GLUCAP 297* 324* 162* 224* 285*       Signed:  Tailer Volkert  [facility] 11/25/2017, 4:17 PM

## 2017-11-25 NOTE — Progress Notes (Signed)
Pt given discharge papers. Being taken down via wheelchair by NT with all of her belongings.

## 2017-11-25 NOTE — Progress Notes (Signed)
LTM takedown; pt had small skin breakdown at Fp2 and F4. Dr Deborah Chalk notified of LTM discontinued.

## 2017-11-25 NOTE — Progress Notes (Signed)
LTM EEG checked. Fp2 electrode reapplied. Pt reports no aphasic events however did report multiple episodes of right hand numbness. She did push the event button during these episodes of numbness.

## 2017-11-25 NOTE — Procedures (Signed)
  Electroencephalogram report- LTM   Data acquisition: 10-20 electrode placement.  Additional T1, T2, and EKG electrodes; 26 channel digital referential acquisition reformatted to 18 channel/7 channel coronal bipolar     Spike detection: ON     Seizure detection: ON   Beginning time: 11/24/2017 at 09 23 am Ending time: 11/25/2017 at 708 am   CPT: 95951 Day of study: Day 2   This intensive EEG monitoring with simultaneous video monitoring was performed for this patient with spells of aphasia as a part of ongoing series to capture events of interest and determine if these are seizures.    Medications: As per EMR  Day 1 Waking background activities were marked by  9 cps posterior dominant symmetric synchronized alpha rhythm which tends to attenuate with eyes opening.  11 pushbutton activation events were recorded.  On all occasions activated by patient in response to the spell was accompanied by inability to speak.  Electrographically the surveillance were accompanied by buildup of rhythmic synchronized evolve in 5-6 cps sharp waves with some seizures evolving into high amplitude delta slowing in at the end of the ictal pattern.  At the onset of ictal discharges appeared fairly broad through the left hemisphere with maximum negativity to left frontotemporal region.  As seizure evolved there is appear to be a negative field extending into the right hemisphere as well particularly parieto-occipital cortex.  Clinically patient reaches for event button she is not tested.  She does not spontaneously exhibiting any vocalizations or speech.  There is bilateral hands movements that often appears in a purposeless way without clear lateralizing features.  On a few occasions with procedures patient was covered and so semiology is difficult to correct arise and on some occasions she was not on the camera.  Day 2: Two PB events unclear if accidents or events, patient was not tested.  No EEG correlate with  these PB events. No clinical or subclinical seizures. Occasional left FT slowing interictally.   Clinical interpretation: This day 2 of  intensive EEG monitoring with simultaneous video monitoring did not demonstrate any clinical or subclinical seizures. Two PB activation events without eeg changes. If these were activated by the patient due to aura, these events  could be related to SPS and might not be accompanied by EEG changes. Clinical correlation is advised.

## 2017-11-25 NOTE — Care Management Note (Signed)
Case Management Note  Patient Details  Name: [name] MRN: [id number] Date of Birth: 08-18-1941  Subjective/Objective:   Pt in with expressive aphasia. She is from home with her spouse.                  Action/Plan: CM consulted for assistance with her insulins. After speaking with DM coordinator it was suggested pt continue with her Toujeo and use Regular insulin for SS instead of Humalog. MD made aware. CM informed the patient and she was in agreement.  Pt states she has transportation home.   Expected Discharge Date:  11/25/17               Expected Discharge Plan:  Home/Self Care  In-House Referral:     Discharge planning Services  CM Consult(medication assistance)  Post Acute Care Choice:    Choice offered to:     DME Arranged:    DME Agency:     HH Arranged:    HH Agency:     Status of Service:  Completed, signed off  If discussed at [employer], dates discussed:    Additional Comments:  Pollie Friar, RN 11/25/2017, 5:24 PM

## 2017-11-26 ENCOUNTER — Other Ambulatory Visit: Payer: Self-pay | Admitting: *Deleted

## 2017-11-26 ENCOUNTER — Telehealth: Payer: Self-pay | Admitting: Family Medicine

## 2017-11-26 ENCOUNTER — Telehealth: Payer: Self-pay | Admitting: Internal Medicine

## 2017-11-26 ENCOUNTER — Ambulatory Visit: Payer: Medicare HMO | Admitting: Internal Medicine

## 2017-11-26 NOTE — Telephone Encounter (Signed)
I called pt for TCM, pt is back at work part time hours, husband says to call late in afternoon or early in the morning. I will try again later.

## 2017-11-26 NOTE — Telephone Encounter (Addendum)
Copied from [place] [id number]. Topic: Quick Communication - Rx Refill/Question >> Nov 26, 2017  3:19 PM Scherrie Gerlach wrote: Has the patient contacted their pharmacy? {yes  Pt states she was in the hospital dc'd yesterday, and was prescribed insulin regular (NOVOLIN R RELION) 100 units/mL injection (vial)  Hospital also sent in Insulin Pen Needle (PEN NEEDLES 3/16") 31G X 5 MM MISC  However, the pharmacy states these pen needles do NOT go with the vial.   Pt states she has never used a vial before.  If she goes the route of the vial, she will need a rx for syringes.  Pharmacy states the only other alternative is to do an insulin flex pen.  Pt wanted to know what would be less expensive. Advised pt did not have that info. Pt did not get anything from the pharmacy. Will wait to hear from you.  Pt has appt with Dr Raliegh Ip on 12/09/2017 I asked pt if she has any insulin to take, pt states seh still has the Insulin Glargine (TOUJEO SOLOSTAR) 300 UNIT/ML SOPN  [facility] [place], [place] [phone] (Phone) [phone] (Fax)

## 2017-11-26 NOTE — Patient Outreach (Signed)
[place] [facility]) Care Management  11/26/2017  [name] Jul 30, 1941 [id number]   Transition of care (1st) D/C 12/19  RN attempted outreach call today however unsuccessful but able to leave a HIPAA approved voice message requesting a call back. Will continue outreach calls accordingly.  Raina Mina, RN Care Management Coordinator [place] Office [phone]

## 2017-11-26 NOTE — Telephone Encounter (Signed)
I spoke with pharmacy and Novolin R does not come in pen form. I spoke with pt and explained that Dr. Raliegh Ip is not in the office, will ask another provider to review to see if another insulin can be prescribed, if not then pt will have to come in for teaching on how to draw up insulin from syringe and inject properly.

## 2017-11-26 NOTE — Telephone Encounter (Signed)
Transition Care Management Follow-up Telephone Call  Admit date: 11/21/2017 Discharge date: 11/25/2017  Time spent: 45 minutes  Recommendations for Outpatient Follow-up:  Patient will be discharged to home.  Patient will need to follow up with primary care provider within one week of discharge.  Follow up with neurology. Patient should continue medications as prescribed.  Patient should follow a heart healthy/carb modified diet. Do not drive.   Discharge Diagnoses:  Expressive aphasia Coronary artery disease Questionable history of atrial flutter Diabetes mellitus, type II Essential hypertension/chronic diastolic and systolic heart failure Acute kidney injury on chronic kidney disease, stage III AML COPD  Discharge Condition: Stable      How have you been since you were released from the hospital? " feeling better"   Do you understand why you were in the hospital? yes   Do you understand the discharge instructions? yes   Where were you discharged to? Home   Items Reviewed:  Medications reviewed: yes  Allergies reviewed: yes  Dietary changes reviewed: yes  Referrals reviewed: yes   Functional Questionnaire:   Activities of Daily Living (ADLs):   She states they are independent in the following: ambulation, bathing and hygiene, feeding, continence, grooming, toileting and dressing States they require assistance with the following: none   Any transportation issues/concerns?: no   Any patient concerns? Yes, script for Novolin R vial was given, pt has never drawn up insulin before, she uses Toujeo however that is in pen form. Dr. Raliegh Ip is out of office, we will follow up on this with another provider and call patient back.    Confirmed importance and date/time of follow-up visits scheduled no  Provider Appointment booked with, patient declined at this time.   Confirmed with patient if condition begins to worsen call PCP or go to the ER.  Patient was given  the office number and encouraged to call back with question or concerns.  : yes

## 2017-11-27 NOTE — Telephone Encounter (Signed)
We do not have any samples. I tried to reach pt, left a message for pt to return our call. We need to schedule nurse visit to show how to inject, if pt agrees to this?

## 2017-11-27 NOTE — Telephone Encounter (Signed)
If we do not have samples of Humalog or NovoLog pen-to use until Dr.K gets back, recommend that she come in with Novolin vial be instructed how to give

## 2017-11-30 ENCOUNTER — Encounter: Payer: Self-pay | Admitting: *Deleted

## 2017-11-30 ENCOUNTER — Other Ambulatory Visit: Payer: Self-pay | Admitting: *Deleted

## 2017-11-30 MED ORDER — INSULIN SYRINGES (DISPOSABLE) U-100 0.3 ML MISC: 0 refills | Status: DC

## 2017-11-30 NOTE — Telephone Encounter (Signed)
Pt has been scheduled for 1/7/209 Monday at 4 pm with Dr. Raliegh Ip, also we did send in syringes to [facility] to go with Novolin. Pt was sent home with script for new insulin but no syringes. She will pick up today and ask the pharmacist for directions in drawing up, she doesn't remember someone at the hospital going over the drawing up part.

## 2017-11-30 NOTE — Telephone Encounter (Signed)
I spoke with pt, scheduled follow up appointment with Dr. Raliegh Ip and sent script for syringes to [place], okay per Dr. Elease Hashimoto to order the syringes. Pt will have pharmacist go over directions on drawing up. I will forward the TCM call to Dr. Raliegh Ip for review. This was a separate phone note.

## 2017-11-30 NOTE — Patient Outreach (Signed)
[place] [facility]) Care Management  11/30/2017  [name] 03-05-1941 [id number]    Transition of care  (Declined)  RN spoke with pt today and verified identifiers and purpose for today's call. Pt receptive to the call as RN explained the [facility] services and her recent hospitalization. Pt states she does not wish to received her discharge sheet with medications at this time and further indicated she does not wish to engage in [facility] community case management services at this time. Pt opt to decline nursing visits and disease management services both community and telephonically at this time. RN offered to continue to engage via transition of care however pt has also opted to discontinue. RN has informed pt that her provider will be update on her decision not to engage further with [facility] for disease specific services at this time (pt with clear understanding).  Raina Mina, RN Care Management Coordinator [place] Office [phone]

## 2017-11-30 NOTE — Telephone Encounter (Signed)
Per Dr. Elease Hashimoto okay to order syringes, done.

## 2017-12-02 ENCOUNTER — Other Ambulatory Visit: Payer: Self-pay | Admitting: Pharmacist

## 2017-12-02 NOTE — Patient Outreach (Signed)
[name]) Care Management  12/02/2017  [name] March 17, 1941 [id number]  Patient was referred to [place] Pharmacist by [facility] Liaison, Orrin Brigham, for discharge medication review.   Noted per review of chart notes, patient declined [facility] RN Lisa's services.    Unsuccessful phone outreach to patient---gentleman answered phone, no PHI exchanged, and reports patient not available at time of call.   Plan:  Will make second phone outreach attempt with in the next week.   Karrie Meres, PharmD, [place] [phone]

## 2017-12-03 ENCOUNTER — Ambulatory Visit: Payer: Self-pay | Admitting: Pharmacist

## 2017-12-03 ENCOUNTER — Ambulatory Visit: Payer: Medicare HMO | Admitting: Oncology

## 2017-12-03 ENCOUNTER — Other Ambulatory Visit: Payer: Medicare HMO

## 2017-12-04 ENCOUNTER — Other Ambulatory Visit: Payer: Self-pay | Admitting: Pharmacist

## 2017-12-04 NOTE — Patient Outreach (Signed)
[place] [facility]) Care Management  12/04/2017  ITSEL OPFER Dec 31, 1940 [id number]  Second unsuccessful phone outreach to patient.  HIPAA compliant message left requesting return call.   Plan:  Third phone outreach attempt next week.   Karrie Meres, PharmD, [place] [phone]

## 2017-12-07 ENCOUNTER — Other Ambulatory Visit: Payer: Self-pay | Admitting: Pharmacist

## 2017-12-07 NOTE — Patient Outreach (Signed)
[place] [facility]) Care Management  12/07/2017  KANON COLUNGA Apr 13, 1941 [id number]  Third outreach attempt to patient.  HIPAA compliant message left requesting return call.    Plan:  Will make final outreach attempt this week.   Karrie Meres, PharmD, [place] [phone]

## 2017-12-09 ENCOUNTER — Ambulatory Visit (INDEPENDENT_AMBULATORY_CARE_PROVIDER_SITE_OTHER): Payer: PRIVATE HEALTH INSURANCE

## 2017-12-09 DIAGNOSIS — E538 Deficiency of other specified B group vitamins: Secondary | ICD-10-CM

## 2017-12-09 MED ORDER — CYANOCOBALAMIN 1000 MCG/ML IJ SOLN
1000.0000 ug | Freq: Once | INTRAMUSCULAR | Status: AC
Start: 2017-12-09 — End: 2017-12-09
  Administered 2017-12-09: 1000 ug via INTRAMUSCULAR

## 2017-12-10 ENCOUNTER — Ambulatory Visit: Payer: Medicare HMO

## 2017-12-14 ENCOUNTER — Encounter: Payer: Self-pay | Admitting: Internal Medicine

## 2017-12-14 ENCOUNTER — Telehealth: Payer: Self-pay | Admitting: Oncology

## 2017-12-14 ENCOUNTER — Ambulatory Visit (INDEPENDENT_AMBULATORY_CARE_PROVIDER_SITE_OTHER): Payer: Medicare HMO | Admitting: Internal Medicine

## 2017-12-14 ENCOUNTER — Inpatient Hospital Stay: Payer: Medicare HMO

## 2017-12-14 ENCOUNTER — Inpatient Hospital Stay: Payer: Medicare HMO | Attending: Oncology

## 2017-12-14 ENCOUNTER — Inpatient Hospital Stay (HOSPITAL_BASED_OUTPATIENT_CLINIC_OR_DEPARTMENT_OTHER): Payer: Medicare HMO | Admitting: Oncology

## 2017-12-14 VITALS — BP 121/39 | HR 76 | Temp 98.0°F | Resp 16 | Ht 62.0 in | Wt 218.2 lb

## 2017-12-14 VITALS — BP 122/58 | HR 107 | Temp 97.9°F | Ht 62.0 in | Wt 219.6 lb

## 2017-12-14 DIAGNOSIS — T451X5D Adverse effect of antineoplastic and immunosuppressive drugs, subsequent encounter: Secondary | ICD-10-CM

## 2017-12-14 DIAGNOSIS — D61810 Antineoplastic chemotherapy induced pancytopenia: Secondary | ICD-10-CM

## 2017-12-14 DIAGNOSIS — C9201 Acute myeloblastic leukemia, in remission: Secondary | ICD-10-CM | POA: Insufficient documentation

## 2017-12-14 DIAGNOSIS — IMO0001 Reserved for inherently not codable concepts without codable children: Secondary | ICD-10-CM

## 2017-12-14 DIAGNOSIS — Z794 Long term (current) use of insulin: Secondary | ICD-10-CM | POA: Diagnosis not present

## 2017-12-14 DIAGNOSIS — R4701 Aphasia: Secondary | ICD-10-CM | POA: Diagnosis not present

## 2017-12-14 DIAGNOSIS — Z95828 Presence of other vascular implants and grafts: Secondary | ICD-10-CM

## 2017-12-14 DIAGNOSIS — E119 Type 2 diabetes mellitus without complications: Secondary | ICD-10-CM | POA: Diagnosis not present

## 2017-12-14 DIAGNOSIS — N289 Disorder of kidney and ureter, unspecified: Secondary | ICD-10-CM

## 2017-12-14 DIAGNOSIS — N184 Chronic kidney disease, stage 4 (severe): Secondary | ICD-10-CM | POA: Diagnosis not present

## 2017-12-14 DIAGNOSIS — I10 Essential (primary) hypertension: Secondary | ICD-10-CM

## 2017-12-14 DIAGNOSIS — Z452 Encounter for adjustment and management of vascular access device: Secondary | ICD-10-CM | POA: Insufficient documentation

## 2017-12-14 DIAGNOSIS — I4891 Unspecified atrial fibrillation: Secondary | ICD-10-CM | POA: Diagnosis not present

## 2017-12-14 DIAGNOSIS — C9202 Acute myeloblastic leukemia, in relapse: Secondary | ICD-10-CM

## 2017-12-14 LAB — CBC WITH DIFFERENTIAL/PLATELET
Abs Granulocyte: 3.8 10*3/uL (ref 1.5–6.5)
Basophils Absolute: 0 10*3/uL (ref 0.0–0.1)
Basophils Relative: 0 %
EOS ABS: 0.2 10*3/uL (ref 0.0–0.5)
EOS PCT: 3 %
HCT: 35.9 % (ref 34.8–46.6)
Hemoglobin: 11.7 g/dL (ref 11.6–15.9)
LYMPHS ABS: 1.2 10*3/uL (ref 0.9–3.3)
Lymphocytes Relative: 22 %
MCH: 30.5 pg (ref 25.1–34.0)
MCHC: 32.6 g/dL (ref 31.5–36.0)
MCV: 93.5 fL (ref 79.5–101.0)
MONOS PCT: 8 %
Monocytes Absolute: 0.5 10*3/uL (ref 0.1–0.9)
Neutro Abs: 3.8 10*3/uL (ref 1.5–6.5)
Neutrophils Relative %: 67 %
PLATELETS: 136 10*3/uL — AB (ref 145–400)
RBC: 3.84 MIL/uL (ref 3.70–5.45)
RDW: 13.9 % (ref 11.2–16.1)
WBC: 5.7 10*3/uL (ref 3.9–10.3)

## 2017-12-14 MED ORDER — HEPARIN SOD (PORK) LOCK FLUSH 100 UNIT/ML IV SOLN
500.0000 [IU] | Freq: Once | INTRAVENOUS | Status: AC | PRN
  Administered 2017-12-14: 500 [IU] via INTRAVENOUS
  Filled 2017-12-14: qty 5

## 2017-12-14 MED ORDER — INSULIN REGULAR HUMAN 100 UNIT/ML IJ SOLN: INTRAMUSCULAR | 1 refills | Status: DC

## 2017-12-14 MED ORDER — SODIUM CHLORIDE 0.9 % IJ SOLN
10.0000 mL | INTRAMUSCULAR | Status: DC | PRN
  Administered 2017-12-14: 10 mL via INTRAVENOUS
  Filled 2017-12-14: qty 10

## 2017-12-14 NOTE — Progress Notes (Signed)
Subjective:    Patient ID: [name], female    DOB: February 06, 1941, 77 y.o.   MRN: [id number]  HPI  Lab Results  Component Value Date   HGBA1C 12.4 (H) 11/22/2017   77 year old patient who was hospitalized last month with expressive a aphasia.  She is now on Keppra for suspected seizure disorder. Her insurance plan does not cover Humalog pens and she has been on basal insulin only.  When discharged from the hospital she was given Humalog vials which she has not picked up due to hesitation about proper use.  She states that she has no experience with drawing up insulin through vials and injecting with insulin syringes.   She was seen earlier today follow-up oncology  Past Medical History:  Diagnosis Date  . acute myeloblastic leukemia (AML) dx'd 06/2014  . Acute pancreatitis   . Arthritis    "back" (09/17/2017)  . Atrial flutter ([place])   . Cardiomyopathy ([place])   . Chronic kidney disease, stage II (mild)   . Chronic lower back pain   . Colon polyps 04/29/2010   TUBULAR ADENOMA AND A SERRATED ADENOMA  . COPD (chronic obstructive pulmonary disease) ([place])   . CORONARY ARTERY DISEASE 12/24/2007  . DIABETES MELLITUS, TYPE II 07/13/2007  . History of blood transfusion 2015   "related to leukemia"  . History of kidney stones 12/24/2007  . HYPERLIPIDEMIA 12/24/2007  . HYPERTENSION 07/13/2007  . NSTEMI (non-ST elevated myocardial infarction) ([place]) 09/17/2017  . Unspecified disease of pancreas   . Uterine cancer [facility])      Social History   Socioeconomic History  . Marital status: Married    Spouse name: Not on file  . Number of children: Not on file  . Years of education: Not on file  . Highest education level: Not on file  Social Needs  . Financial resource strain: Not on file  . Food insecurity - worry: Not on file  . Food insecurity - inability: Not on file  . Transportation needs - medical: Not on file  . Transportation needs - non-medical: Not on file  Occupational History  .  Occupation: works in a lab    Comment: makes glasses  Tobacco Use  . Smoking status: Former Smoker    Packs/day: 1.00    Years: 20.00    Pack years: 20.00    Types: Cigarettes    Last attempt to quit: 12/08/2000    Years since quitting: 17.0  . Smokeless tobacco: Never Used  Substance and Sexual Activity  . Alcohol use: No    Alcohol/week: 0.0 oz  . Drug use: No  . Sexual activity: No  Other Topics Concern  . 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 ABDOMINAL HYSTERECTOMY     "still have my ovaries"  . CARDIAC CATHETERIZATION  2002  . CARDIAC CATHETERIZATION  09/17/2017  . CATARACT EXTRACTION W/ INTRAOCULAR LENS  IMPLANT, BILATERAL Bilateral   . COLONOSCOPY W/ BIOPSIES AND POLYPECTOMY  2011  . CORONARY STENT INTERVENTION N/A 09/21/2017   Procedure: CORONARY STENT INTERVENTION;  Surgeon: Troy Sine, MD;  Location: [facility];  Service: Cardiovascular;  Laterality: N/A;  . DILATION AND CURETTAGE OF UTERUS    . EXCISIONAL HEMORRHOIDECTOMY  X 2  . GANGLION CYST EXCISION Left X 3  . INTRAVASCULAR PRESSURE WIRE/FFR STUDY N/A 09/17/2017   Procedure: INTRAVASCULAR PRESSURE WIRE/FFR  STUDY;  Surgeon: Nelva Bush, MD;  Location: [place] [facility];  Service: Cardiovascular;  Laterality: N/A;  . NASAL SINUS SURGERY    . PORTA CATH INSERTION Right 2013  . RIGHT/LEFT HEART CATH AND CORONARY ANGIOGRAPHY N/A 09/17/2017   Procedure: RIGHT/LEFT HEART CATH AND CORONARY ANGIOGRAPHY;  Surgeon: Nelva Bush, MD;  Location: [place] [facility];  Service: Cardiovascular;  Laterality: N/A;  . TUBAL LIGATION      Family History  Problem Relation Age of Onset  . Cancer Father        throat ca  . COPD Father   . Cancer Sister        uterine ca and breast ca  . Cancer Brother        throat  . Congestive Heart Failure Mother   . Heart attack Maternal Grandmother   .  Colon cancer Neg Hx     No Known Allergies  Current Outpatient Medications on File Prior to Visit  Medication Sig Dispense Refill  . amLODipine (NORVASC) 5 MG tablet TAKE 1 TABLET (5 MG TOTAL) BY MOUTH DAILY. 90 tablet 1  . atorvastatin (LIPITOR) 40 MG tablet Take 1 tablet (40 mg total) by mouth daily. 90 tablet 3  . clopidogrel (PLAVIX) 75 MG tablet Take 1 tablet (75 mg total) by mouth daily with breakfast. (Patient taking differently: Take 75 mg by mouth every evening. ) 90 tablet 3  . fluticasone furoate-vilanterol (BREO ELLIPTA) 200-25 MCG/INH AEPB Inhale 1 puff into the lungs daily. (Patient taking differently: Inhale 1 puff into the lungs as needed. ) 120 each 5  . furosemide (LASIX) 40 MG tablet Take 1 tablet (40 mg total) by mouth daily. 90 tablet 1  . HYDROcodone-acetaminophen (NORCO/VICODIN) 5-325 MG tablet Take 1 tablet every 6 (six) hours as needed by mouth for moderate pain. 120 tablet 0  . Insulin Glargine (TOUJEO SOLOSTAR) 300 UNIT/ML SOPN Inject 50 Units at bedtime into the skin. 1 pen 6  . Insulin Pen Needle (PEN NEEDLES 3/16") 31G X 5 MM MISC Use with Novolin R 100 each 0  . Insulin Syringes, Disposable, U-100 0.3 ML MISC Use three times per day 100 each 0  . isosorbide mononitrate (IMDUR) 30 MG 24 hr tablet Take 1 tablet (30 mg total) by mouth daily. (Patient taking differently: Take 30 mg by mouth daily as needed. ) 90 tablet 3  . levETIRAcetam (KEPPRA) 1000 MG tablet Take 1 tablet (1,000 mg total) by mouth 2 (two) times daily. 60 tablet 0  . LORazepam (ATIVAN) 0.5 MG tablet Take 1 tablet (0.5 mg total) every 6 (six) hours as needed by mouth for anxiety. 90 tablet 5  . magnesium oxide (MAG-OX) 400 (241.3 Mg) MG tablet Take 2 tablets (800 mg total) by mouth 2 (two) times daily. 360 tablet 3  . metoprolol succinate (TOPROL XL) 100 MG 24 hr tablet Take 1 tablet (100 mg total) by mouth daily. Take with or immediately following a meal. 90 tablet 3  . mometasone-formoterol (DULERA)  200-5 MCG/ACT AERO Inhale 2 puffs into the lungs 2 (two) times daily. 2 Inhaler 6  . nitroGLYCERIN (NITROSTAT) 0.4 MG SL tablet Place 1 tablet (0.4 mg total) under the tongue every 5 (five) minutes as needed for chest pain. 24 tablet 1  . polyethylene glycol powder (GLYCOLAX/MIRALAX) powder Take 17 grams by mouth in 8 ounces of water one to two times daily for constipation. 850 g 5  . promethazine (PHENERGAN) 12.5 MG tablet Take 2 tablets (25 mg  total) by mouth every 6 (six) hours as needed for nausea. 12 tablet 0  . aspirin 81 MG chewable tablet Chew 1 tablet (81 mg total) by mouth daily. (Patient not taking: Reported on 12/14/2017) 30 tablet 1   Current Facility-Administered Medications on File Prior to Visit  Medication Dose Route Frequency Provider Last Rate Last Dose  . sodium chloride 0.9 % 1,000 mL with magnesium sulfate 2 g infusion   Intravenous Continuous Ladell Pier, MD   Stopped at 08/04/14 1441    BP (!) 122/58 (BP Location: Left Arm, Patient Position: Sitting, Cuff Size: Normal)   Pulse (!) 107   Temp 97.9 F (36.6 C) (Oral)   Ht 5\' 2"  (1.575 m)   Wt 219 lb 9.6 oz (99.6 kg)   SpO2 91%   BMI 40.17 kg/m    Review of Systems  Constitutional: Negative.   HENT: Negative for congestion, dental problem, hearing loss, rhinorrhea, sinus pressure, sore throat and tinnitus.   Eyes: Negative for pain, discharge and visual disturbance.  Respiratory: Negative for cough and shortness of breath.   Cardiovascular: Negative for chest pain, palpitations and leg swelling.  Gastrointestinal: Negative for abdominal distention, abdominal pain, blood in stool, constipation, diarrhea, nausea and vomiting.  Genitourinary: Negative for difficulty urinating, dysuria, flank pain, frequency, hematuria, pelvic pain, urgency, vaginal bleeding, vaginal discharge and vaginal pain.  Musculoskeletal: Negative for arthralgias, gait problem and joint swelling.  Skin: Negative for rash.  Neurological:  Negative for dizziness, syncope, speech difficulty, weakness, numbness and headaches.  Hematological: Negative for adenopathy.  Psychiatric/Behavioral: Negative for agitation, behavioral problems and dysphoric mood. The patient is not nervous/anxious.        Objective:   Physical Exam  Constitutional: She appears well-developed and well-nourished. No distress.  Cardiovascular: Normal rate and regular rhythm.  Pulmonary/Chest: Effort normal and breath sounds normal.  Abdominal: Soft.          Assessment & Plan:   Diabetes mellitus poor control.  Will continue present basal insulin.  Samples of NovoLog vials dispensed.  Patient was given considerable samples of insulin syringes with needles.  Insulin injection technique demonstrated and discussed at length.  Greater than 30 minutes was spent discussing and demonstrating insulin injection technique;  patient return in 1 month for follow-up Hypertension Seizure disorder   [name]

## 2017-12-14 NOTE — Patient Instructions (Addendum)
Return next month for follow-up  20 units prior to each meal  Limit your sodium (Salt) intake  Please check your blood pressure on a regular basis.  If it is consistently greater than 150/90, please make an office appointment.

## 2017-12-14 NOTE — Progress Notes (Signed)
  [place] OFFICE PROGRESS NOTE   Diagnosis: AML  INTERVAL HISTORY:   [name] returns as scheduled.  She was admitted 11/21/2017 with expressive a aphasia.  No acute findings were found on imaging studies.  The aphasia improved.  She was started on Keppra after seizure activity was noted on an EEG.  No recurrent a aphasia.  No seizures.  No recent fever or infection.  No bleeding.  Objective:  Vital signs in last 24 hours:  Blood pressure (!) 121/39, pulse 76, temperature 98 F (36.7 C), temperature source Oral, resp. rate 16, height 5\' 2"  (1.575 m), weight 218 lb 3.2 oz (99 kg), SpO2 99 %.    HEENT: Small ecchymosis at the posterior right buccal mucosa, no thrush Resp: Lungs clear bilaterally Cardio: Distant heart sounds, regular rate and rhythm GI: Mild tenderness at the costal margin bilaterally, no hepatosplenomegaly Vascular: Trace pretibial edema bilaterally    Portacath/PICC-without erythema  Lab Results:  Lab Results  Component Value Date   WBC 5.7 12/14/2017   HGB 11.7 12/14/2017   HCT 35.9 12/14/2017   MCV 93.5 12/14/2017   PLT 136 (L) 12/14/2017   NEUTROABS 3.8 12/14/2017    Medications: I have reviewed the patient's current medications.   Assessment/Plan: 1. 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  Status post cycle 3 high-dose araC consolidation 08/22/2014.  Bone marrow 09/15/2014 showed necrotic marrow with minute focus of myeloid precursors. No evidence of viable acute leukemia. 2. Pancytopenia secondary to chemotherapy. Improved.  3. C. difficile colitis-06/09/2014 treated with  Flagyl  4. Diabetes  5. renal insufficiency  6. history of hypokalemia and hypomagnesemia. 7. history of atrial fibrillation  9. history of coronary artery disease  10. Hospitalization with febrile neutropenia/sepsis secondary to Escherichia coli bacteremia 09/02/2014. 11. Shingles involving the right back October 2015. 12. Meningioma on brain CT 05/24/2014 and brain MRI 05/24/2014 13. Hospitalization 08/24/2017 through 08/28/2017 with acute CHF. 14.  Admission 11/21/2017 with expressive aphasia, seizure activity noted on EEG, placed on Keppra  Disposition: [name] remains in clinical remission from AML.  We flush the Port-A-Cath today.  She will see Dr. Florene Glen in 6 weeks.  She will return for an office visit here in 12 weeks.  15 minutes were spent with the patient today.  The majority of the time was used for counseling and coordination of care.  Betsy Coder, MD  12/14/2017  1:00 PM

## 2017-12-14 NOTE — Telephone Encounter (Signed)
Gave patient avs and calendar with appts per 1/7 los.  °

## 2017-12-16 ENCOUNTER — Other Ambulatory Visit: Payer: Self-pay | Admitting: Pharmacist

## 2017-12-16 NOTE — Patient Outreach (Signed)
[place] [facility]) Care Management  12/16/2017  [name] 1940/12/18 [id number]  Final phone outreach attempt to patient---unsuccessful---HIPAA compliant message left requesting return call.   Plan:  Outreach letter mailed.   If no response from patient within 10 business days---will close pharmacy episode.   Karrie Meres, PharmD, [place] [phone]

## 2017-12-26 ENCOUNTER — Telehealth: Payer: Self-pay | Admitting: Internal Medicine

## 2017-12-28 NOTE — Telephone Encounter (Signed)
Pt called to check on status of the refill of levETIRAcetam (KEPPRA) 1000 MG tablet ,  Pt states she has been out of this medication since Saturday.  [id number], [address] [phone] (Phone) [phone] (Fax)

## 2017-12-28 NOTE — Telephone Encounter (Signed)
Rx sent today.

## 2017-12-30 ENCOUNTER — Other Ambulatory Visit: Payer: Self-pay | Admitting: Pharmacist

## 2017-12-30 NOTE — Patient Outreach (Signed)
[facility]) Care Management  12/30/2017  IYANLA EILERS 1941/05/25 [id number]  Four phone outreach attempts were made and an outreach letter mailed with no response from patient.   Plan:  Case will be closed due to inability to maintain contact with patient.   Closure letter sent to MD.   Karrie Meres, PharmD, [place] [phone]

## 2017-12-31 ENCOUNTER — Telehealth: Payer: Self-pay

## 2017-12-31 ENCOUNTER — Telehealth: Payer: Self-pay | Admitting: Internal Medicine

## 2017-12-31 NOTE — Telephone Encounter (Signed)
Copied from [place] [id number]. Topic: General - Other >> Dec 31, 2017 12:38 PM Cecelia Byars, NT wrote: Reason for CRM: Patient said the pharmacy has Humalog on back order and would like samples if they are available please advise [phone]

## 2017-12-31 NOTE — Telephone Encounter (Signed)
Per chart pt is not on Humalog. She was given Novolin rx on 12/14/17 along with Toujeo Per pt she has not been to pharmacy to pick up rx's. Advised her to contact pharmacy for current rx's. Pt is concerned she may not have current insurance coverage or they may not cover rx. Advised her she will need to talk to pharmacy about that, and then she can call us if she needs to change medication or still needs sample. Pt voiced understanding and will call pharmacy.    Copied from [place] [id number]. Topic: Quick Communication - See Telephone Encounter >> Dec 31, 2017  9:55 AM Percell Belt A wrote: CRM for notification. See Telephone encounter for: pt called in and would like to know if DR K could provide with a sample of the Humalog insulin.  Not the pen the bottle.  She would like someone to call her at [phone]   12/31/17.

## 2018-01-01 NOTE — Telephone Encounter (Signed)
Pt was called and informed via detailed voice message that medication samples are available  for pick up.  Drug name: Humalog U-100      Strength: 100 units/mL        Qty: 1 pen LOT: [id number]  Exp.Date: 10/08/19  The patient has been instructed regarding the correct time, dose, and frequency of taking this medication, including desired effects and most common side effects.   [name] 9:30 AM 01/01/2018

## 2018-01-13 ENCOUNTER — Telehealth: Payer: Self-pay | Admitting: Internal Medicine

## 2018-01-13 NOTE — Telephone Encounter (Signed)
Copied from [place]. Topic: Referral - Request >> Jan 08, 2018  9:52 AM Pricilla Handler wrote: Reason for CRM: Patient called requesting referrals for her [facility] insurance for the following three providers: Dr. Pearlean Brownie - Oncologist in [place], [place]; Dr. Learta Codding - Oncologist in [place]; and Dr. Cathie Olden - Cardiologist in [place]. Patient also wants a call from the referral coordinator for a question. Patient wants to know if the referral last for 6 months?       Thank You!!!

## 2018-01-13 NOTE — Telephone Encounter (Signed)
Ok for referrals  

## 2018-01-18 ENCOUNTER — Other Ambulatory Visit: Payer: Self-pay | Admitting: Internal Medicine

## 2018-01-18 DIAGNOSIS — C9500 Acute leukemia of unspecified cell type not having achieved remission: Secondary | ICD-10-CM

## 2018-01-18 DIAGNOSIS — I25119 Atherosclerotic heart disease of native coronary artery with unspecified angina pectoris: Secondary | ICD-10-CM

## 2018-01-18 NOTE — Telephone Encounter (Signed)
All referrals okay.  Thank you

## 2018-01-18 NOTE — Telephone Encounter (Signed)
Referral orders placed

## 2018-01-19 ENCOUNTER — Encounter: Payer: Self-pay | Admitting: Internal Medicine

## 2018-01-19 ENCOUNTER — Ambulatory Visit (INDEPENDENT_AMBULATORY_CARE_PROVIDER_SITE_OTHER): Payer: Medicare HMO | Admitting: Internal Medicine

## 2018-01-19 VITALS — BP 130/60 | HR 73 | Temp 98.5°F | Ht 62.0 in | Wt 216.2 lb

## 2018-01-19 DIAGNOSIS — R52 Pain, unspecified: Secondary | ICD-10-CM

## 2018-01-19 DIAGNOSIS — F1190 Opioid use, unspecified, uncomplicated: Secondary | ICD-10-CM

## 2018-01-19 DIAGNOSIS — M542 Cervicalgia: Secondary | ICD-10-CM

## 2018-01-19 DIAGNOSIS — Z5181 Encounter for therapeutic drug level monitoring: Secondary | ICD-10-CM | POA: Diagnosis not present

## 2018-01-19 MED ORDER — INSULIN GLARGINE 300 UNIT/ML ~~LOC~~ SOPN: 50.0000 [IU] | PEN_INJECTOR | Freq: Every day | SUBCUTANEOUS | 6 refills | Status: DC

## 2018-01-19 MED ORDER — HYDROCODONE-ACETAMINOPHEN 5-325 MG PO TABS: 1.0000 | ORAL_TABLET | Freq: Four times a day (QID) | ORAL | 0 refills | Status: DC | PRN

## 2018-01-19 MED ORDER — ATORVASTATIN CALCIUM 40 MG PO TABS: 40.0000 mg | ORAL_TABLET | Freq: Every day | ORAL | 3 refills | Status: DC

## 2018-01-19 MED ORDER — CLOPIDOGREL BISULFATE 75 MG PO TABS: 75.0000 mg | ORAL_TABLET | Freq: Every evening | ORAL | 3 refills | Status: DC

## 2018-01-19 MED ORDER — INSULIN REGULAR HUMAN 100 UNIT/ML IJ SOLN: INTRAMUSCULAR | 1 refills | Status: DC

## 2018-01-19 MED ORDER — ISOSORBIDE MONONITRATE ER 30 MG PO TB24: 30.0000 mg | ORAL_TABLET | Freq: Every day | ORAL | 3 refills | Status: DC

## 2018-01-19 MED ORDER — METOPROLOL SUCCINATE ER 100 MG PO TB24: 100.0000 mg | ORAL_TABLET | Freq: Every day | ORAL | 3 refills | Status: DC

## 2018-01-19 MED ORDER — LEVETIRACETAM 1000 MG PO TABS: 1000.0000 mg | ORAL_TABLET | Freq: Two times a day (BID) | ORAL | 0 refills | Status: DC

## 2018-01-19 MED ORDER — AMLODIPINE BESYLATE 5 MG PO TABS: 5.0000 mg | ORAL_TABLET | Freq: Every day | ORAL | 1 refills | Status: DC

## 2018-01-19 MED ORDER — LORAZEPAM 0.5 MG PO TABS: ORAL_TABLET | ORAL | 2 refills | Status: DC

## 2018-01-19 NOTE — Patient Instructions (Signed)
Please check your hemoglobin A1c every 3 months  Return for routine office follow-up as scheduled

## 2018-01-19 NOTE — Progress Notes (Signed)
   Subjective:    Patient ID: [name], female    DOB: 1941-10-24, 77 y.o.   MRN: [id number]  HPI  77 year old patient who is seen today for pain management evaluation per opioid protocol  [place] printed, initialed  and scanned into the EMR.  Indication for chronic opioid: Patient has chronic cervical and low back pain Medication and dose: Patient has been treated with hydrocodone with acetaminophen 4 times daily. # pills per month: 120/month Last UDS date: January 19, 2018 Pain contract signed (Y/N): January 19, 2018 Date narcotic database last reviewed (include red flags): January 19, 2018.  Overdose risk score to 230  Review of Systems     Objective:   Physical Exam        Assessment & Plan:    Encounter for chronic pain management (G89.29) Narcotic use  (711.90) Pain management contract signed (Z02.89)  [name]

## 2018-01-22 DIAGNOSIS — J189 Pneumonia, unspecified organism: Secondary | ICD-10-CM | POA: Diagnosis not present

## 2018-01-22 DIAGNOSIS — N183 Chronic kidney disease, stage 3 (moderate): Secondary | ICD-10-CM | POA: Diagnosis not present

## 2018-01-22 DIAGNOSIS — J918 Pleural effusion in other conditions classified elsewhere: Secondary | ICD-10-CM | POA: Diagnosis not present

## 2018-01-22 DIAGNOSIS — E538 Deficiency of other specified B group vitamins: Secondary | ICD-10-CM | POA: Diagnosis not present

## 2018-01-22 DIAGNOSIS — I480 Paroxysmal atrial fibrillation: Secondary | ICD-10-CM | POA: Diagnosis not present

## 2018-01-22 DIAGNOSIS — C9201 Acute myeloblastic leukemia, in remission: Secondary | ICD-10-CM | POA: Diagnosis not present

## 2018-01-22 DIAGNOSIS — I252 Old myocardial infarction: Secondary | ICD-10-CM | POA: Diagnosis not present

## 2018-01-22 DIAGNOSIS — I129 Hypertensive chronic kidney disease with stage 1 through stage 4 chronic kidney disease, or unspecified chronic kidney disease: Secondary | ICD-10-CM | POA: Diagnosis not present

## 2018-01-22 DIAGNOSIS — R0600 Dyspnea, unspecified: Secondary | ICD-10-CM | POA: Diagnosis not present

## 2018-01-22 DIAGNOSIS — K5900 Constipation, unspecified: Secondary | ICD-10-CM | POA: Diagnosis not present

## 2018-01-22 DIAGNOSIS — J449 Chronic obstructive pulmonary disease, unspecified: Secondary | ICD-10-CM | POA: Diagnosis not present

## 2018-01-22 DIAGNOSIS — J9600 Acute respiratory failure, unspecified whether with hypoxia or hypercapnia: Secondary | ICD-10-CM | POA: Diagnosis not present

## 2018-01-22 DIAGNOSIS — J90 Pleural effusion, not elsewhere classified: Secondary | ICD-10-CM | POA: Diagnosis not present

## 2018-01-22 DIAGNOSIS — N189 Chronic kidney disease, unspecified: Secondary | ICD-10-CM | POA: Diagnosis not present

## 2018-01-22 LAB — PAIN MGMT, PROFILE 8 W/CONF, U
6 Acetylmorphine: NEGATIVE ng/mL (ref <10)
AMPHETAMINES: NEGATIVE ng/mL (ref <500)
Alcohol Metabolites: NEGATIVE ng/mL (ref <500)
Benzodiazepines: NEGATIVE ng/mL (ref <100)
Buprenorphine, Urine: NEGATIVE ng/mL (ref <5)
COCAINE METABOLITE: NEGATIVE ng/mL (ref <150)
CODEINE: NEGATIVE ng/mL (ref <50)
Creatinine: 95.7 mg/dL
HYDROMORPHONE: 256 ng/mL — AB (ref <50)
Hydrocodone: 712 ng/mL — ABNORMAL HIGH (ref <50)
MARIJUANA METABOLITE: NEGATIVE ng/mL (ref <20)
MDMA: NEGATIVE ng/mL (ref <500)
Morphine: NEGATIVE ng/mL (ref <50)
Norhydrocodone: 303 ng/mL — ABNORMAL HIGH (ref <50)
OXYCODONE: NEGATIVE ng/mL (ref <100)
Opiates: POSITIVE ng/mL — AB (ref <100)
Oxidant: NEGATIVE ug/mL (ref <200)
pH: 5.15 (ref 4.5–9.0)

## 2018-01-25 ENCOUNTER — Ambulatory Visit (INDEPENDENT_AMBULATORY_CARE_PROVIDER_SITE_OTHER): Payer: Medicare HMO | Admitting: Internal Medicine

## 2018-01-25 ENCOUNTER — Encounter: Payer: Self-pay | Admitting: Internal Medicine

## 2018-01-25 VITALS — Temp 98.5°F | Ht 62.0 in | Wt 213.4 lb

## 2018-01-25 DIAGNOSIS — Z794 Long term (current) use of insulin: Secondary | ICD-10-CM | POA: Diagnosis not present

## 2018-01-25 DIAGNOSIS — IMO0001 Reserved for inherently not codable concepts without codable children: Secondary | ICD-10-CM

## 2018-01-25 DIAGNOSIS — E538 Deficiency of other specified B group vitamins: Secondary | ICD-10-CM | POA: Diagnosis not present

## 2018-01-25 DIAGNOSIS — I10 Essential (primary) hypertension: Secondary | ICD-10-CM

## 2018-01-25 DIAGNOSIS — E119 Type 2 diabetes mellitus without complications: Secondary | ICD-10-CM

## 2018-01-25 DIAGNOSIS — I5042 Chronic combined systolic (congestive) and diastolic (congestive) heart failure: Secondary | ICD-10-CM | POA: Diagnosis not present

## 2018-01-25 MED ORDER — LEVETIRACETAM 1000 MG PO TABS: 1000.0000 mg | ORAL_TABLET | Freq: Two times a day (BID) | ORAL | 0 refills | Status: DC

## 2018-01-25 MED ORDER — INSULIN GLARGINE 300 UNIT/ML ~~LOC~~ SOPN: 50.0000 [IU] | PEN_INJECTOR | Freq: Every day | SUBCUTANEOUS | 6 refills | Status: DC

## 2018-01-25 MED ORDER — INSULIN REGULAR HUMAN 100 UNIT/ML IJ SOLN: INTRAMUSCULAR | 1 refills | Status: DC

## 2018-01-25 MED ORDER — CYANOCOBALAMIN 1000 MCG/ML IJ SOLN
1000.0000 ug | Freq: Once | INTRAMUSCULAR | Status: AC
  Administered 2018-04-20: 1000 ug via INTRAMUSCULAR

## 2018-01-25 NOTE — Progress Notes (Signed)
Subjective:    Patient ID: [name], female    DOB: 07-Jul-1941, 77 y.o.   MRN: [id number]  HPI  Lab Results  Component Value Date   HGBA1C 12.4 (H) 11/22/2017   77 year old patient who is seen today for follow-up.  She has insulin dependent diabetes.  She continues to have issues with affordability and presently is not on mealtime insulin.  Samples again provided today.  Last hemoglobin A1c was extremely poorly controlled She is scheduled for cardiology follow-up soon with expected lab draw.  She.  Does have chronic kidney disease. She does have coronary artery disease which appears stable. She is followed by hematology with a history of AML in remission.  She was admitted to the hospital in December for expressive a aphasia.  She remains on AED therapy   Past Medical History:  Diagnosis Date  . acute myeloblastic leukemia (AML) dx'd 06/2014  . Acute pancreatitis   . Arthritis    "back" (09/17/2017)  . Atrial flutter ([place])   . Cardiomyopathy ([place])   . Chronic kidney disease, stage II (mild)   . Chronic lower back pain   . Colon polyps 04/29/2010   TUBULAR ADENOMA AND A SERRATED ADENOMA  . COPD (chronic obstructive pulmonary disease) ([place])   . CORONARY ARTERY DISEASE 12/24/2007  . DIABETES MELLITUS, TYPE II 07/13/2007  . History of blood transfusion 2015   "related to leukemia"  . History of kidney stones 12/24/2007  . HYPERLIPIDEMIA 12/24/2007  . HYPERTENSION 07/13/2007  . NSTEMI (non-ST elevated myocardial infarction) ([place]) 09/17/2017  . Unspecified disease of pancreas   . Uterine cancer [facility])      Social History   Socioeconomic History  . Marital status: Married    Spouse name: Not on file  . Number of children: Not on file  . Years of education: Not on file  . Highest education level: Not on file  Social Needs  . Financial resource strain: Not on file  . Food insecurity - worry: Not on file  . Food insecurity - inability: Not on file  . Transportation needs -  medical: Not on file  . Transportation needs - non-medical: Not on file  Occupational History  . Occupation: works in a lab    Comment: makes glasses  Tobacco Use  . Smoking status: Former Smoker    Packs/day: 1.00    Years: 20.00    Pack years: 20.00    Types: Cigarettes    Last attempt to quit: 12/08/2000    Years since quitting: 17.1  . Smokeless tobacco: Never Used  Substance and Sexual Activity  . Alcohol use: No    Alcohol/week: 0.0 oz  . Drug use: No  . Sexual activity: No  Other Topics Concern  . 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 ABDOMINAL HYSTERECTOMY     "still have my ovaries"  . CARDIAC CATHETERIZATION  2002  . CARDIAC CATHETERIZATION  09/17/2017  . CATARACT EXTRACTION W/ INTRAOCULAR LENS  IMPLANT, BILATERAL Bilateral   . COLONOSCOPY W/ BIOPSIES AND POLYPECTOMY  2011  . CORONARY STENT INTERVENTION N/A 09/21/2017   Procedure: CORONARY STENT INTERVENTION;  Surgeon: Troy Sine, MD;  Location: [place] [facility];  Service: Cardiovascular;  Laterality: N/A;  . DILATION AND CURETTAGE OF UTERUS    . EXCISIONAL HEMORRHOIDECTOMY  X 2  . GANGLION CYST EXCISION Left X 3  .  INTRAVASCULAR PRESSURE WIRE/FFR STUDY N/A 09/17/2017   Procedure: INTRAVASCULAR PRESSURE WIRE/FFR STUDY;  Surgeon: Nelva Bush, MD;  Location: [place] [facility];  Service: Cardiovascular;  Laterality: N/A;  . NASAL SINUS SURGERY    . PORTA CATH INSERTION Right 2013  . RIGHT/LEFT HEART CATH AND CORONARY ANGIOGRAPHY N/A 09/17/2017   Procedure: RIGHT/LEFT HEART CATH AND CORONARY ANGIOGRAPHY;  Surgeon: Nelva Bush, MD;  Location: [place] [facility];  Service: Cardiovascular;  Laterality: N/A;  . TUBAL LIGATION      Family History  Problem Relation Age of Onset  . Cancer Father        throat ca  . COPD Father   . Cancer Sister        uterine ca and breast ca  . Cancer Brother         throat  . Congestive Heart Failure Mother   . Heart attack Maternal Grandmother   . Colon cancer Neg Hx     No Known Allergies  Current Outpatient Medications on File Prior to Visit  Medication Sig Dispense Refill  . amLODipine (NORVASC) 5 MG tablet Take 1 tablet (5 mg total) by mouth daily. 90 tablet 1  . aspirin 81 MG chewable tablet Chew 1 tablet (81 mg total) by mouth daily. 30 tablet 1  . atorvastatin (LIPITOR) 40 MG tablet Take 1 tablet (40 mg total) by mouth daily. 90 tablet 3  . clopidogrel (PLAVIX) 75 MG tablet Take 1 tablet (75 mg total) by mouth every evening. 90 tablet 3  . fluticasone furoate-vilanterol (BREO ELLIPTA) 200-25 MCG/INH AEPB Inhale 1 puff into the lungs daily. (Patient taking differently: Inhale 1 puff into the lungs as needed. ) 120 each 5  . furosemide (LASIX) 40 MG tablet Take 1 tablet (40 mg total) by mouth daily. 90 tablet 1  . HYDROcodone-acetaminophen (NORCO/VICODIN) 5-325 MG tablet Take 1 tablet by mouth every 6 (six) hours as needed for moderate pain. 120 tablet 0  . Insulin Glargine (TOUJEO SOLOSTAR) 300 UNIT/ML SOPN Inject 50 Units into the skin at bedtime. 1 pen 6  . Insulin Pen Needle (PEN NEEDLES 3/16") 31G X 5 MM MISC Use with Novolin R 100 each 0  . insulin regular (NOVOLIN R RELION) 100 units/mL injection Take 20 units TID. Take 4 extra units if CBG >200. Take 6 extra untis if CBG >300 10 mL 1  . Insulin Syringes, Disposable, U-100 0.3 ML MISC Use three times per day 100 each 0  . isosorbide mononitrate (IMDUR) 30 MG 24 hr tablet Take 1 tablet (30 mg total) by mouth daily. 90 tablet 3  . levETIRAcetam (KEPPRA) 1000 MG tablet Take 1 tablet (1,000 mg total) by mouth 2 (two) times daily. 60 tablet 0  . LORazepam (ATIVAN) 0.5 MG tablet 1 tablet every 8 hours as needed for anxiety not to exceed 2 tablets per 24 hours 60 tablet 2  . metoprolol succinate (TOPROL XL) 100 MG 24 hr tablet Take 1 tablet (100 mg total) by mouth daily. Take with or  immediately following a meal. 90 tablet 3  . mometasone-formoterol (DULERA) 200-5 MCG/ACT AERO Inhale 2 puffs into the lungs 2 (two) times daily. 2 Inhaler 6  . nitroGLYCERIN (NITROSTAT) 0.4 MG SL tablet Place 1 tablet (0.4 mg total) under the tongue every 5 (five) minutes as needed for chest pain. 24 tablet 1  . polyethylene glycol powder (GLYCOLAX/MIRALAX) powder Take 17 grams by mouth in 8 ounces of water one to two times daily for constipation. [place]  g 5  . promethazine (PHENERGAN) 12.5 MG tablet Take 2 tablets (25 mg total) by mouth every 6 (six) hours as needed for nausea. 12 tablet 0   Current Facility-Administered Medications on File Prior to Visit  Medication Dose Route Frequency Provider Last Rate Last Dose  . sodium chloride 0.9 % 1,000 mL with magnesium sulfate 2 g infusion   Intravenous Continuous Ladell Pier, MD   Stopped at 08/04/14 1441    Temp 98.5 F (36.9 C) (Oral)   Ht 5\' 2"  (1.575 m)   Wt 213 lb 6.4 oz (96.8 kg)   BMI 39.03 kg/m       Review of Systems  Constitutional: Negative.   HENT: Negative for congestion, dental problem, hearing loss, rhinorrhea, sinus pressure, sore throat and tinnitus.   Eyes: Negative for pain, discharge and visual disturbance.  Respiratory: Negative for cough and shortness of breath.   Cardiovascular: Negative for chest pain, palpitations and leg swelling.  Gastrointestinal: Negative for abdominal distention, abdominal pain, blood in stool, constipation, diarrhea, nausea and vomiting.  Genitourinary: Negative for difficulty urinating, dysuria, flank pain, frequency, hematuria, pelvic pain, urgency, vaginal bleeding, vaginal discharge and vaginal pain.  Musculoskeletal: Positive for back pain. Negative for arthralgias, gait problem and joint swelling.  Skin: Negative for rash.  Neurological: Negative for dizziness, syncope, speech difficulty, weakness, numbness and headaches.  Hematological: Negative for adenopathy.    Psychiatric/Behavioral: Negative for agitation, behavioral problems and dysphoric mood. The patient is not nervous/anxious.        Objective:   Physical Exam  Constitutional: She is oriented to person, place, and time. She appears well-developed and well-nourished.  Blood pressure 104/64  HENT:  Head: Normocephalic.  Right Ear: External ear normal.  Left Ear: External ear normal.  Mouth/Throat: Oropharynx is clear and moist.  Eyes: Conjunctivae and EOM are normal. Pupils are equal, round, and reactive to light.  Neck: Normal range of motion. Neck supple. No thyromegaly present.  Cardiovascular: Normal rate, regular rhythm, normal heart sounds and intact distal pulses.  Pulmonary/Chest: Effort normal and breath sounds normal.  Abdominal: Soft. Bowel sounds are normal. She exhibits no mass. There is no tenderness.  Musculoskeletal: Normal range of motion.  Lymphadenopathy:    She has no cervical adenopathy.  Neurological: She is alert and oriented to person, place, and time.  Skin: Skin is warm and dry. No rash noted.  Psychiatric: She has a normal mood and affect. Her behavior is normal.          Assessment & Plan:   Diabetes mellitus.  Poorly controlled.  Samples provided.  Compliance stressed.  Patient was told that she will need referral to endocrinology if progress is not made AML in remission Chronic kidney disease Hypertension History of expressive aphasia  Follow-up 2 months  Kevork Joyce Pilar Plate

## 2018-01-25 NOTE — Patient Instructions (Signed)
Limit your sodium (Salt) intake   Please check your hemoglobin A1c every 3 months  Return in 3 months for follow-up  Cardiology follow-up as scheduled  Oncology follow-up as scheduled

## 2018-02-08 ENCOUNTER — Telehealth: Payer: Self-pay | Admitting: Internal Medicine

## 2018-02-08 NOTE — Telephone Encounter (Signed)
Copied from [place] [id number]. Topic: General - Other >> Feb 08, 2018 12:23 PM Darl Householder, RMA wrote: Reason for CRM: Medication refill request for Insulin Glargine (TOUJEO SOLOSTAR) 300 UNIT/ML SOPN to be sent to [facility] [address]

## 2018-02-09 NOTE — Telephone Encounter (Signed)
Call to patient- informed patient to contact the pharmacy and let them know to fill her prescription- it was sent in on February.

## 2018-02-11 ENCOUNTER — Other Ambulatory Visit: Payer: Self-pay

## 2018-02-11 NOTE — Telephone Encounter (Signed)
Patient is out of Insulin and says the [facility] on [address] is telling her we have to write her a new script. She wants Tujeo Vials and not the pens. Advised this can take 2 to 3 business days.

## 2018-02-11 NOTE — Telephone Encounter (Signed)
Spoke to patient and told her that I would talk to someone to see if we can switch the pens to the vial.

## 2018-02-12 NOTE — Telephone Encounter (Signed)
Spoke to the pt.  She informed me that Toujeo is too expensive.  Requesting a different medication.  Informed her that Dr. Raliegh Ip will need to authorize medication change.  Will forward.  Please send to Gwenyth Ober as I will be out of the office until Tuesday of next week.

## 2018-02-14 NOTE — Telephone Encounter (Signed)
Please asked patient to check with her insurance plan for least expensive option for daily/basal insulin and we will refill that medication

## 2018-02-15 NOTE — Telephone Encounter (Signed)
Pt called - verbalized understanding of message.  She is going to call insurance company and find out what option ins cheapest.

## 2018-02-16 ENCOUNTER — Telehealth: Payer: Self-pay | Admitting: Internal Medicine

## 2018-02-16 NOTE — Telephone Encounter (Signed)
Copied from [place] [phone]. Topic: Quick Communication - Rx Refill/Question >> Feb 16, 2018  4:22 PM Margot Ables wrote: Medication: Insulin Glargine ([place]) 300 UNIT/ML SOPN - pt out of this medication - pt requesting sample to be picked up tomorrow by her spouse  Pt asking for new RX for Lantus Solostar - pt states it is like Toujeo - requesting 90 day supply sent in thru mail order - [place], [place] [phone] (Phone) [phone] (Fax)  Refill insulin regular (NOVOLIN R RELION) 100 units/mL injection - not covered thru [employer] order - this needs sent via new RX to [facility] [place] [phone] (Phone) [phone] (Fax)

## 2018-02-17 ENCOUNTER — Other Ambulatory Visit: Payer: Self-pay

## 2018-02-17 ENCOUNTER — Ambulatory Visit: Payer: Medicare HMO | Admitting: Cardiovascular Disease

## 2018-02-17 ENCOUNTER — Encounter: Payer: Self-pay | Admitting: Nurse Practitioner

## 2018-02-17 ENCOUNTER — Ambulatory Visit: Payer: Self-pay | Admitting: *Deleted

## 2018-02-17 ENCOUNTER — Other Ambulatory Visit: Payer: Medicare HMO

## 2018-02-17 ENCOUNTER — Encounter: Payer: Self-pay | Admitting: Cardiovascular Disease

## 2018-02-17 VITALS — BP 114/44 | HR 63 | Ht 62.0 in | Wt 212.8 lb

## 2018-02-17 DIAGNOSIS — I5042 Chronic combined systolic (congestive) and diastolic (congestive) heart failure: Secondary | ICD-10-CM | POA: Diagnosis not present

## 2018-02-17 DIAGNOSIS — I2510 Atherosclerotic heart disease of native coronary artery without angina pectoris: Secondary | ICD-10-CM | POA: Diagnosis not present

## 2018-02-17 DIAGNOSIS — E785 Hyperlipidemia, unspecified: Secondary | ICD-10-CM

## 2018-02-17 LAB — LIPID PANEL
CHOL/HDL RATIO: 3.4 ratio (ref 0.0–4.4)
Cholesterol, Total: 130 mg/dL (ref 100–199)
HDL: 38 mg/dL — AB (ref >39)
LDL Calculated: 60 mg/dL (ref 0–99)
TRIGLYCERIDES: 161 mg/dL — AB (ref 0–149)
VLDL Cholesterol Cal: 32 mg/dL (ref 5–40)

## 2018-02-17 LAB — BASIC METABOLIC PANEL
BUN / CREAT RATIO: 15 (ref 12–28)
BUN: 26 mg/dL (ref 8–27)
CO2: 23 mmol/L (ref 20–29)
CREATININE: 1.7 mg/dL — AB (ref 0.57–1.00)
Calcium: 8.7 mg/dL (ref 8.7–10.3)
Chloride: 101 mmol/L (ref 96–106)
GFR calc non Af Amer: 29 mL/min/{1.73_m2} — ABNORMAL LOW (ref >59)
GFR, EST AFRICAN AMERICAN: 33 mL/min/{1.73_m2} — AB (ref >59)
Glucose: 395 mg/dL — ABNORMAL HIGH (ref 65–99)
Potassium: 4.5 mmol/L (ref 3.5–5.2)
SODIUM: 139 mmol/L (ref 134–144)

## 2018-02-17 LAB — HEPATIC FUNCTION PANEL
ALT: 16 IU/L (ref 0–32)
AST: 11 IU/L (ref 0–40)
Albumin: 3.7 g/dL (ref 3.5–4.8)
Alkaline Phosphatase: 145 IU/L — ABNORMAL HIGH (ref 39–117)
BILIRUBIN TOTAL: 0.5 mg/dL (ref 0.0–1.2)
BILIRUBIN, DIRECT: 0.16 mg/dL (ref 0.00–0.40)
TOTAL PROTEIN: 5.8 g/dL — AB (ref 6.0–8.5)

## 2018-02-17 MED ORDER — INSULIN GLARGINE 100 UNIT/ML SOLOSTAR PEN: PEN_INJECTOR | SUBCUTANEOUS | 2 refills | Status: DC

## 2018-02-17 MED ORDER — INSULIN GLARGINE 300 UNIT/ML ~~LOC~~ SOPN: 50.0000 [IU] | PEN_INJECTOR | Freq: Every day | SUBCUTANEOUS | 6 refills | Status: DC

## 2018-02-17 MED ORDER — INSULIN REGULAR HUMAN 100 UNIT/ML IJ SOLN: INTRAMUSCULAR | 1 refills | Status: DC

## 2018-02-17 NOTE — Patient Instructions (Signed)
Medication Instructions:  Your physician recommends that you continue on your current medications as directed. Please refer to the Current Medication list given to you today.   Labwork: TODAY - cholesterol, liver panel, basic metabolic panel   Testing/Procedures: None Ordered   Follow-Up: Your physician wants you to follow-up in: 6 months with Richardson Dopp, PA or another member of Dr. Elmarie Shiley team. Dennis Bast will receive a reminder letter in the mail two months in advance. If you don't receive a letter, please call our office to schedule the follow-up appointment.   If you need a refill on your cardiac medications before your next appointment, please call your pharmacy.   Thank you for choosing [facility]! Christen Bame, RN [phone]

## 2018-02-17 NOTE — Telephone Encounter (Signed)
Insulin sent to patient preferred pharmacy.

## 2018-02-17 NOTE — Telephone Encounter (Signed)
Left message for patient to return phone call.  

## 2018-02-17 NOTE — Telephone Encounter (Signed)
Notified patient that prescriptions were sent.

## 2018-02-17 NOTE — Telephone Encounter (Signed)
Medications was sent in. Patient was notified. No further action needed.

## 2018-02-17 NOTE — Telephone Encounter (Signed)
[facility] [address]- 30 day supply- Solostar                     [facility] [place]-  Insulin  Vials - uses 20 units -3 times/day                     [place]- 3 month supply  Patient blood sugar today 444. She has used 26 units 3 times today

## 2018-02-17 NOTE — Telephone Encounter (Signed)
Patient said that her husband has an appt today at 1pm & wants to know if he can pick it up then for her

## 2018-02-17 NOTE — Progress Notes (Signed)
Cardiology Office Note:    Date:  02/17/2018   ID:  [name], DOB January 22, 1941, MRN [id number]  PCP:  Marletta Lor, MD  Cardiologist:  Murad Staples   Referring MD: Marletta Lor, MD   Chief Complaint  Patient presents with  . Coronary Artery Disease    History of Present Illness:    [name] is a 77 y.o. female with a hx of coronary artery disease and a non-ST segment elevation myocardial infarction.  She was admitted in #2018 with a non-ST segment elevation myocardial infarction in the setting of acute combined systolic and diastolic congestive heart failure.  Her ejection fraction was 40-45%.  Patient has chronic kidney disease and we treat her medically in an effort to avoid cardiac catheterization at that time.  She was seen in follow-up on October 10 and continued to have shortness of breath and occasional chest tightness.  We ultimately did do heart cath which showed severe three-vessel coronary artery disease.  She was seen by Dr. Servando Snare for surgical consultation.  She was thought to be too high risk for CABG. She subsequently had PCI to her right coronary artery.  He was found to have a moderate LAD stenosis with an FFR of 0.72 at that time.   She is been treated medically with the thought that we would intervene on the LAD if she continued to have episodes of chest pain and shortness of breath.  She is feeling well.   Some dyspnea.   No CP    Past Medical History:  Diagnosis Date  . acute myeloblastic leukemia (AML) dx'd 06/2014  . Acute pancreatitis   . Arthritis    "back" (09/17/2017)  . Atrial flutter ([place])   . Cardiomyopathy ([place])   . Chronic kidney disease, stage II (mild)   . Chronic lower back pain   . Colon polyps 04/29/2010   TUBULAR ADENOMA AND A SERRATED ADENOMA  . COPD (chronic obstructive pulmonary disease) ([place])   . CORONARY ARTERY DISEASE 12/24/2007  . DIABETES MELLITUS, TYPE II 07/13/2007  . History of blood transfusion 2015   "related  to leukemia"  . History of kidney stones 12/24/2007  . HYPERLIPIDEMIA 12/24/2007  . HYPERTENSION 07/13/2007  . NSTEMI (non-ST elevated myocardial infarction) ([place]) 09/17/2017  . Unspecified disease of pancreas   . Uterine cancer [facility])     Past Surgical History:  Procedure Laterality Date  . ABDOMINAL HYSTERECTOMY     "still have my ovaries"  . CARDIAC CATHETERIZATION  2002  . CARDIAC CATHETERIZATION  09/17/2017  . CATARACT EXTRACTION W/ INTRAOCULAR LENS  IMPLANT, BILATERAL Bilateral   . COLONOSCOPY W/ BIOPSIES AND POLYPECTOMY  2011  . CORONARY STENT INTERVENTION N/A 09/21/2017   Procedure: CORONARY STENT INTERVENTION;  Surgeon: Troy Sine, MD;  Location: [place] [facility];  Service: Cardiovascular;  Laterality: N/A;  . DILATION AND CURETTAGE OF UTERUS    . EXCISIONAL HEMORRHOIDECTOMY  X 2  . GANGLION CYST EXCISION Left X 3  . INTRAVASCULAR PRESSURE WIRE/FFR STUDY N/A 09/17/2017   Procedure: INTRAVASCULAR PRESSURE WIRE/FFR STUDY;  Surgeon: Nelva Bush, MD;  Location: [place] [facility];  Service: Cardiovascular;  Laterality: N/A;  . NASAL SINUS SURGERY    . PORTA CATH INSERTION Right 2013  . RIGHT/LEFT HEART CATH AND CORONARY ANGIOGRAPHY N/A 09/17/2017   Procedure: RIGHT/LEFT HEART CATH AND CORONARY ANGIOGRAPHY;  Surgeon: Nelva Bush, MD;  Location: [place] [facility];  Service: Cardiovascular;  Laterality: N/A;  . TUBAL LIGATION  Current Medications: Current Meds  Medication Sig  . amLODipine (NORVASC) 5 MG tablet Take 1 tablet (5 mg total) by mouth daily.  Marland Kitchen aspirin 325 MG tablet Take 325 mg by mouth daily.  Marland Kitchen atorvastatin (LIPITOR) 40 MG tablet Take 1 tablet (40 mg total) by mouth daily.  . clopidogrel (PLAVIX) 75 MG tablet Take 1 tablet (75 mg total) by mouth every evening.  . fluticasone furoate-vilanterol (BREO ELLIPTA) 200-25 MCG/INH AEPB Inhale 1 puff into the lungs daily.  . furosemide (LASIX) 40 MG tablet Take 40 mg by mouth daily as needed for edema.    Marland Kitchen HYDROcodone-acetaminophen (NORCO/VICODIN) 5-325 MG tablet Take 1 tablet by mouth every 6 (six) hours as needed for moderate pain.  . Insulin Glargine (TOUJEO SOLOSTAR) 300 UNIT/ML SOPN Inject 50 Units into the skin at bedtime.  . Insulin Pen Needle (PEN NEEDLES 3/16") 31G X 5 MM MISC Use with Novolin R  . insulin regular (NOVOLIN R RELION) 100 units/mL injection Take 20 units TID. Take 4 extra units if CBG >200. Take 6 extra untis if CBG >300  . Insulin Syringes, Disposable, U-100 0.3 ML MISC Use three times per day  . isosorbide mononitrate (IMDUR) 30 MG 24 hr tablet Take 1 tablet (30 mg total) by mouth daily.  Marland Kitchen levETIRAcetam (KEPPRA) 1000 MG tablet Take 1 tablet (1,000 mg total) by mouth 2 (two) times daily.  Marland Kitchen LORazepam (ATIVAN) 0.5 MG tablet 1 tablet every 8 hours as needed for anxiety not to exceed 2 tablets per 24 hours  . metoprolol succinate (TOPROL XL) 100 MG 24 hr tablet Take 1 tablet (100 mg total) by mouth daily. Take with or immediately following a meal.  . mometasone-formoterol (DULERA) 200-5 MCG/ACT AERO Inhale 2 puffs into the lungs 2 (two) times daily.  . nitroGLYCERIN (NITROSTAT) 0.4 MG SL tablet Place 1 tablet (0.4 mg total) under the tongue every 5 (five) minutes as needed for chest pain.  . polyethylene glycol powder (GLYCOLAX/MIRALAX) powder Take 17 grams by mouth in 8 ounces of water one to two times daily for constipation.  . promethazine (PHENERGAN) 12.5 MG tablet Take 2 tablets (25 mg total) by mouth every 6 (six) hours as needed for nausea.   Current Facility-Administered Medications for the 02/17/18 encounter (Office Visit) with Meghan Warshawsky, Wonda Cheng, MD  Medication  . cyanocobalamin ((VITAMIN B-12)) injection 1,000 mcg     Allergies:   Patient has no known allergies.   Social History   Socioeconomic History  . Marital status: Married    Spouse name: None  . Number of children: None  . Years of education: None  . Highest education level: None  Social Needs  .  Financial resource strain: None  . Food insecurity - worry: None  . Food insecurity - inability: None  . Transportation needs - medical: None  . Transportation needs - non-medical: None  Occupational History  . Occupation: works in a lab    Comment: makes glasses  Tobacco Use  . Smoking status: Former Smoker    Packs/day: 1.00    Years: 20.00    Pack years: 20.00    Types: Cigarettes    Last attempt to quit: 12/08/2000    Years since quitting: 17.2  . Smokeless tobacco: Never Used  Substance and Sexual Activity  . Alcohol use: No    Alcohol/week: 0.0 oz  . Drug use: No  . Sexual activity: No  Other Topics Concern  . 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 There is no history of Colon cancer.  ROS:   Please see the history of present illness.     All other systems reviewed and are negative.  EKGs/Labs/Other Studies Reviewed:    The following studies were reviewed today:   EKG:    Recent Labs: 08/24/2017: B Natriuretic Peptide 357.9; TSH 6.197 11/21/2017: ALT 17 11/25/2017: BUN 25; Creatinine, Ser 1.55; Potassium 5.0; Sodium 137 12/14/2017: Hemoglobin 11.7; Platelets 136  Recent Lipid Panel    Component Value Date/Time   CHOL 127 11/22/2017 0354   TRIG 222 (H) 11/22/2017 0354   HDL 28 (L) 11/22/2017 0354   CHOLHDL 4.5 11/22/2017 0354   VLDL 44 (H) 11/22/2017 0354   LDLCALC 55 11/22/2017 0354    Physical Exam:    VS:  BP (!) 114/44   Pulse 63   Ht 5\' 2"  (1.575 m)   Wt 212 lb 12.8 oz (96.5 kg)   SpO2 97%   BMI 38.92 kg/m     Wt Readings from Last 3 Encounters:  02/17/18 212 lb 12.8 oz (96.5 kg)  01/25/18 213 lb 6.4 oz (96.8 kg)  01/19/18 216 lb 3.2 oz (98.1 kg)     GEN:  Well nourished, well developed in no  acute distress HEENT: Normal NECK: No JVD; No carotid bruits LYMPHATICS: No lymphadenopathy CARDIAC: RRR, no murmurs, rubs, gallops RESPIRATORY:  Clear to auscultation without rales, wheezing or rhonchi  ABDOMEN: Soft, non-tender, non-distended MUSCULOSKELETAL:  No edema; No deformity  SKIN: Warm and dry NEUROLOGIC:  Alert and oriented x 3 PSYCHIATRIC:  Normal affect   ASSESSMENT:    No diagnosis found. PLAN:    In order of problems listed above:  1. Coronary artery disease: Stanton Kidney presents for follow-up of her coronary artery disease.  She had a non-ST segment elevation myocardial infarction in September, 2018.  She has subsequent stenting of her right coronary artery in October, 2018.  She is done well.  She has not had any further episodes of chest discomfort.  She does have chronic shortness of breath.  Continue current medications.  Will check fasting lipids, liver enzymes, and basic metabolic profile today.  2.  Essential hypertension: Blood pressure is well controlled.  3.  Chronic diastolic and systolic congestive heart failure: Stable.  She has stable shortness of breath but she seems to be well compensated.   Medication Adjustments/Labs and Tests Ordered: Current medicines are reviewed at length with the patient today.  Concerns regarding medicines are outlined above.  No orders of the defined types were placed in this encounter.  No orders of the defined types were placed in this encounter.   Signed, Mertie Moores, MD  02/17/2018 11:33 AM    [place]

## 2018-02-17 NOTE — Telephone Encounter (Signed)
Spoke to patient letting her know that the medication were sent to [place]. She stated that only the Novolin need to be sent to [facility] and the Lantus need to be sent to [name] aide. Canceled and call medications in. No further action needed.

## 2018-02-18 ENCOUNTER — Telehealth: Payer: Self-pay

## 2018-02-18 NOTE — Telephone Encounter (Signed)
Left voicemail for patient to call back regarding lab results.

## 2018-02-18 NOTE — Telephone Encounter (Signed)
-----   Message from Teressa Senter, RN sent at 02/18/2018 10:06 AM EDT ----- To CMA pool

## 2018-02-19 ENCOUNTER — Telehealth: Payer: Self-pay | Admitting: *Deleted

## 2018-02-19 NOTE — Telephone Encounter (Signed)
Left message to go over lab results. I will forward a copy of results to Dr. Inda Merlin (PCP) as well as to Dr. [name] (Oncologist).

## 2018-02-22 ENCOUNTER — Telehealth: Payer: Self-pay | Admitting: Family Medicine

## 2018-02-22 ENCOUNTER — Ambulatory Visit (INDEPENDENT_AMBULATORY_CARE_PROVIDER_SITE_OTHER): Payer: Medicare HMO | Admitting: Family Medicine

## 2018-02-22 ENCOUNTER — Ambulatory Visit: Payer: Medicare HMO

## 2018-02-22 DIAGNOSIS — E538 Deficiency of other specified B group vitamins: Secondary | ICD-10-CM | POA: Diagnosis not present

## 2018-02-22 MED ORDER — CYANOCOBALAMIN 1000 MCG/ML IJ SOLN
1000.0000 ug | Freq: Once | INTRAMUSCULAR | Status: AC
  Administered 2018-02-22: 1000 ug via INTRAMUSCULAR

## 2018-02-22 NOTE — Telephone Encounter (Signed)
Pt was here today for Vitamin B 12 injection, would like to clarify how often and for how long should patient get these injections? Does she need a level drawn?

## 2018-02-22 NOTE — Progress Notes (Signed)
Per orders of Dr. Kwiatkowski, injection of Vitamin B 12 given by NIMMONS, SYLVIA ANN. Patient tolerated injection well. 

## 2018-02-22 NOTE — Telephone Encounter (Signed)
Pt has been notified of lab results by phon with verbal understanding. Pt advised to be sure to follow up with PCP and Oncologist in regards to ALP elevated as well as her glucose levels are elevated. Pt thanked me for my call and my help today. Results have been sent to both PCP and Oncologist.

## 2018-02-22 NOTE — Telephone Encounter (Signed)
Patient will require monthly B12 injections indefinitely No need for follow-up B12 level

## 2018-03-08 ENCOUNTER — Inpatient Hospital Stay: Payer: Medicare HMO

## 2018-03-08 ENCOUNTER — Telehealth: Payer: Self-pay | Admitting: Nurse Practitioner

## 2018-03-08 ENCOUNTER — Inpatient Hospital Stay: Payer: Medicare HMO | Attending: Oncology | Admitting: Nurse Practitioner

## 2018-03-08 ENCOUNTER — Encounter: Payer: Self-pay | Admitting: Nurse Practitioner

## 2018-03-08 VITALS — BP 159/53 | HR 72 | Temp 98.3°F | Resp 20 | Ht 62.0 in | Wt 214.7 lb

## 2018-03-08 DIAGNOSIS — C9301 Acute monoblastic/monocytic leukemia, in remission: Secondary | ICD-10-CM

## 2018-03-08 DIAGNOSIS — Z452 Encounter for adjustment and management of vascular access device: Secondary | ICD-10-CM | POA: Diagnosis not present

## 2018-03-08 DIAGNOSIS — C9501 Acute leukemia of unspecified cell type, in remission: Secondary | ICD-10-CM

## 2018-03-08 DIAGNOSIS — Z95828 Presence of other vascular implants and grafts: Secondary | ICD-10-CM

## 2018-03-08 MED ORDER — SODIUM CHLORIDE 0.9 % IJ SOLN
10.0000 mL | INTRAMUSCULAR | Status: DC | PRN
  Administered 2018-03-08: 10 mL via INTRAVENOUS
  Filled 2018-03-08: qty 10

## 2018-03-08 MED ORDER — HEPARIN SOD (PORK) LOCK FLUSH 100 UNIT/ML IV SOLN
500.0000 [IU] | Freq: Once | INTRAVENOUS | Status: AC | PRN
  Administered 2018-03-08: 500 [IU] via INTRAVENOUS
  Filled 2018-03-08: qty 5

## 2018-03-08 NOTE — Progress Notes (Signed)
  [place] OFFICE PROGRESS NOTE   Diagnosis: AML  INTERVAL HISTORY:   [name] returns as scheduled.  Other than a "cold" no interim illnesses or infections.  No fevers or sweats.  No bleeding.  She has a good appetite.  Dyspnea is better.  Main complaint is being "tired".  She has stable chronic back pain.  Objective:  Vital signs in last 24 hours:  Blood pressure (!) 159/53, pulse 72, temperature 98.3 F (36.8 C), temperature source Oral, resp. rate 20, height 5\' 2"  (1.575 m), weight 214 lb 11.2 oz (97.4 kg), SpO2 98 %.    HEENT: No thrush or ulcers. Lymphatics: No palpable cervical or supraclavicular lymph nodes. Resp: Lungs clear bilaterally. Cardio: Regular rate and rhythm. GI: Abdomen soft and nontender.  No hepatosplenomegaly. Vascular: Trace lower leg edema bilaterally. Port-A-Cath without erythema.  Lab Results:  Lab Results  Component Value Date   WBC 5.7 12/14/2017   HGB 11.7 12/14/2017   HCT 35.9 12/14/2017   MCV 93.5 12/14/2017   PLT 136 (L) 12/14/2017   NEUTROABS 3.8 12/14/2017    Imaging:  No results found.  Medications: I have reviewed the patient's current medications.  Assessment/Plan: 1. 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  Status post cycle 3 high-dose araC consolidation 08/22/2014.  Bone marrow 09/15/2014 showed necrotic marrow with minute focus of myeloid precursors. No evidence of viable acute leukemia. 2.  History of pancytopenia secondary to chemotherapy.  3. C. difficile colitis-06/09/2014 treated with Flagyl  4. Diabetes  5. renal insufficiency  6. history of  hypokalemia and hypomagnesemia. 7. history of atrial fibrillation  9. history of coronary artery disease  10. Hospitalization with febrile neutropenia/sepsis secondary to Escherichia coli bacteremia 09/02/2014. 11. Shingles involving the right back October 2015. 12. Meningioma on brain CT 05/24/2014 and brain MRI 05/24/2014 13. Hospitalization09/17/2018 through 08/28/2017 with acute CHF. 14.  Admission 11/21/2017 with expressive aphasia, seizure activity noted on EEG, placed on Keppra    Disposition: [name] is now about 4 years out from initial diagnosis of AML.  She remains in clinical remission.  The Port-A-Cath was flushed today.  She will return for the next Port-A-Cath flush in 6 weeks.  She has a follow-up appointment with Dr. Florene Glen in June.  We will see her in follow-up here in approximately 5 months.  She will contact the office in the interim with any problems.  Plan reviewed with Dr. [name].    Ned Card ANP/GNP-BC   03/08/2018  12:15 PM

## 2018-03-08 NOTE — Telephone Encounter (Signed)
Scheduled appt per 4/1 los - Gave patient AVS and calender per los.  

## 2018-03-15 ENCOUNTER — Other Ambulatory Visit: Payer: Self-pay | Admitting: Internal Medicine

## 2018-03-17 ENCOUNTER — Other Ambulatory Visit: Payer: Self-pay | Admitting: Internal Medicine

## 2018-03-17 NOTE — Telephone Encounter (Signed)
Toujeo Solostar 300 unit/ml SOPN refill request  LOV 01/25/18 with Dr. Burnice Logan.  It is requesting specific dosages which I was not sure of or felt comfortable giving.  [place], [place].

## 2018-03-17 NOTE — Telephone Encounter (Signed)
Copied from [place] [id number]. Topic: Quick Communication - Rx Refill/Question >> Mar 17, 2018  9:45 AM Yvette Rack wrote: Medication: TOUJEO SOLOSTAR 300 UNIT/ML SOPN Has the patient contacted their pharmacy? Yes.   (Agent: If no, request that the patient contact the pharmacy for the refill.) Preferred Pharmacy (with phone number or street name): [place], [place] [phone] (Phone) [phone] (Fax)  Agent: Please be advised that RX refills may take up to 3 business days. We ask that you follow-up with your pharmacy.

## 2018-03-22 ENCOUNTER — Telehealth: Payer: Self-pay | Admitting: Internal Medicine

## 2018-03-22 NOTE — Telephone Encounter (Signed)
Copied from [place] [id number]. Topic: Quick Communication - See Telephone Encounter >> Mar 22, 2018  4:36 PM Vernona Rieger wrote: CRM for notification. See Telephone encounter for: 03/22/18.  Patient wants to know if the office has any TOUJEO SOLOSTAR 300 UNIT/ML SOPN samples. She will be coming in Thursday for a B12.

## 2018-03-24 NOTE — Telephone Encounter (Signed)
Patient notified no samples here in the office.

## 2018-03-25 ENCOUNTER — Ambulatory Visit: Payer: Medicare HMO

## 2018-04-19 ENCOUNTER — Inpatient Hospital Stay: Payer: Medicare HMO | Attending: Oncology

## 2018-04-19 DIAGNOSIS — Z452 Encounter for adjustment and management of vascular access device: Secondary | ICD-10-CM | POA: Diagnosis not present

## 2018-04-19 DIAGNOSIS — C9201 Acute myeloblastic leukemia, in remission: Secondary | ICD-10-CM | POA: Insufficient documentation

## 2018-04-19 DIAGNOSIS — Z95828 Presence of other vascular implants and grafts: Secondary | ICD-10-CM

## 2018-04-19 MED ORDER — HEPARIN SOD (PORK) LOCK FLUSH 100 UNIT/ML IV SOLN
500.0000 [IU] | Freq: Once | INTRAVENOUS | Status: AC | PRN
  Administered 2018-04-19: 500 [IU] via INTRAVENOUS
  Filled 2018-04-19: qty 5

## 2018-04-19 MED ORDER — SODIUM CHLORIDE 0.9 % IJ SOLN
10.0000 mL | INTRAMUSCULAR | Status: DC | PRN
  Administered 2018-04-19: 10 mL via INTRAVENOUS
  Filled 2018-04-19: qty 10

## 2018-04-20 ENCOUNTER — Ambulatory Visit (INDEPENDENT_AMBULATORY_CARE_PROVIDER_SITE_OTHER): Payer: Medicare HMO | Admitting: Internal Medicine

## 2018-04-20 ENCOUNTER — Encounter: Payer: Self-pay | Admitting: Internal Medicine

## 2018-04-20 VITALS — BP 126/50 | HR 70 | Temp 98.2°F | Wt 211.0 lb

## 2018-04-20 DIAGNOSIS — G8929 Other chronic pain: Secondary | ICD-10-CM

## 2018-04-20 DIAGNOSIS — E119 Type 2 diabetes mellitus without complications: Secondary | ICD-10-CM | POA: Diagnosis not present

## 2018-04-20 DIAGNOSIS — M549 Dorsalgia, unspecified: Secondary | ICD-10-CM

## 2018-04-20 DIAGNOSIS — E538 Deficiency of other specified B group vitamins: Secondary | ICD-10-CM | POA: Diagnosis not present

## 2018-04-20 DIAGNOSIS — I10 Essential (primary) hypertension: Secondary | ICD-10-CM

## 2018-04-20 LAB — POCT GLYCOSYLATED HEMOGLOBIN (HGB A1C): Hemoglobin A1C: 8.6

## 2018-04-20 MED ORDER — HYDROCODONE-ACETAMINOPHEN 5-325 MG PO TABS: 1.0000 | ORAL_TABLET | Freq: Four times a day (QID) | ORAL | 0 refills | Status: DC | PRN

## 2018-04-20 MED ORDER — INSULIN GLARGINE 300 UNIT/ML ~~LOC~~ SOPN: 50.0000 [IU] | PEN_INJECTOR | Freq: Every day | SUBCUTANEOUS | 6 refills | Status: DC

## 2018-04-20 NOTE — Patient Instructions (Signed)
Limit your sodium (Salt) intake   Please check your hemoglobin A1c every 3 months    It is important that you exercise regularly, at least 20 minutes 3 to 4 times per week.  If you develop chest pain or shortness of breath seek  medical attention.   

## 2018-04-20 NOTE — Progress Notes (Signed)
Subjective:    Patient ID: [name], female    DOB: 1941/10/24, 77 y.o.   MRN: [id number]  HPI  77 year old patient who is seen today for quarterly follow-up. She has a history of diabetes controlled on basal and bolus insulin.  She has had a difficult time affording insulin therapy and presently is pain $250 every 3 months for both basal and mealtime insulin.  She is concerned about affordability when she enters the donut hole.  Samples again provided today Her hemoglobin A1c is much improved at 8.4 down from 12.4 she remains on 50 units of basal insulin.  She occasionally cuts her dose to 40 units when capillary blood glucose testing at bedtime is low.  She occasionally has readings in the 60 range.  Fasting blood sugars generally run in the low to high 100s  She is also seen today for chronic pain management   Per opioid protocol [place] printed, initialed  and scanned into the EMR.  Indication for chronic opioid: Chronic low back pain Medication and dose: Hydrocodone 5 mg acetaminophen 325 4 times daily # pills per month: Maximum 120 Last UDS date: January 19, 2018 Opioid Treatment Agreement signed (Y/N): January 19, 2018 Opioid Treatment Agreement last reviewed with patient:  January 19, 2018 [place] reviewed this encounter (include red flags):  Yes- overdose risk score to 60   Patient is concerned about availability of hydrocodone after retirement of her present PCP.  She states that her insurance plan does not cover for management at a chronic pain center   Review of Systems  Constitutional: Negative.   HENT: Negative for congestion, dental problem, hearing loss, rhinorrhea, sinus pressure, sore throat and tinnitus.   Eyes: Negative for pain, discharge and visual disturbance.  Respiratory: Negative for cough and shortness of breath.   Cardiovascular: Negative for chest pain, palpitations and leg swelling.  Gastrointestinal: Negative for abdominal distention,  abdominal pain, blood in stool, constipation, diarrhea, nausea and vomiting.  Genitourinary: Negative for difficulty urinating, dysuria, flank pain, frequency, hematuria, pelvic pain, urgency, vaginal bleeding, vaginal discharge and vaginal pain.  Musculoskeletal: Positive for back pain. Negative for arthralgias, gait problem and joint swelling.  Skin: Negative for rash.  Neurological: Negative for dizziness, syncope, speech difficulty, weakness, numbness and headaches.  Hematological: Negative for adenopathy.  Psychiatric/Behavioral: Negative for agitation, behavioral problems and dysphoric mood. The patient is not nervous/anxious.        Objective:   Physical Exam  Constitutional: She is oriented to person, place, and time. She appears well-developed and well-nourished.  Weight 211 Blood pressure normal  HENT:  Head: Normocephalic.  Right Ear: External ear normal.  Left Ear: External ear normal.  Mouth/Throat: Oropharynx is clear and moist.  Eyes: Pupils are equal, round, and reactive to light. Conjunctivae and EOM are normal.  Neck: Normal range of motion. Neck supple. No thyromegaly present.  Cardiovascular: Normal rate, regular rhythm, normal heart sounds and intact distal pulses.  Pulmonary/Chest: Effort normal and breath sounds normal.  Abdominal: Soft. Bowel sounds are normal. She exhibits no mass. There is no tenderness.  Musculoskeletal: Normal range of motion.  Lymphadenopathy:    She has no cervical adenopathy.  Neurological: She is alert and oriented to person, place, and time.  Skin: Skin is warm and dry. No rash noted.  Psychiatric: She has a normal mood and affect. Her behavior is normal.          Assessment & Plan:  Diabetes mellitus.  Much improved glycemic  control.  No change in therapy. Nonpharmacologic measures stressed and encouraged Hypertension stable Chronic pain.  Analgesics refilled  Encounter for chronic pain management (G89.29) Narcotic use   (711.90) Pain management contract signed (Z02.89) \ [name]

## 2018-05-21 ENCOUNTER — Ambulatory Visit (INDEPENDENT_AMBULATORY_CARE_PROVIDER_SITE_OTHER): Payer: Medicare HMO | Admitting: *Deleted

## 2018-05-21 DIAGNOSIS — E538 Deficiency of other specified B group vitamins: Secondary | ICD-10-CM | POA: Diagnosis not present

## 2018-05-21 MED ORDER — CYANOCOBALAMIN 1000 MCG/ML IJ SOLN
1000.0000 ug | Freq: Once | INTRAMUSCULAR | Status: AC
  Administered 2018-05-21: 1000 ug via INTRAMUSCULAR

## 2018-05-21 NOTE — Progress Notes (Signed)
Per orders of Dr. Burchette, injection of B12 given by Rachel Vereen. Patient tolerated injection well. 

## 2018-06-03 ENCOUNTER — Other Ambulatory Visit: Payer: Self-pay | Admitting: Internal Medicine

## 2018-06-04 DIAGNOSIS — E1122 Type 2 diabetes mellitus with diabetic chronic kidney disease: Secondary | ICD-10-CM | POA: Diagnosis not present

## 2018-06-04 DIAGNOSIS — I129 Hypertensive chronic kidney disease with stage 1 through stage 4 chronic kidney disease, or unspecified chronic kidney disease: Secondary | ICD-10-CM | POA: Diagnosis not present

## 2018-06-04 DIAGNOSIS — E538 Deficiency of other specified B group vitamins: Secondary | ICD-10-CM | POA: Diagnosis not present

## 2018-06-04 DIAGNOSIS — I480 Paroxysmal atrial fibrillation: Secondary | ICD-10-CM | POA: Diagnosis not present

## 2018-06-04 DIAGNOSIS — C9201 Acute myeloblastic leukemia, in remission: Secondary | ICD-10-CM | POA: Diagnosis not present

## 2018-06-04 DIAGNOSIS — N183 Chronic kidney disease, stage 3 (moderate): Secondary | ICD-10-CM | POA: Diagnosis not present

## 2018-06-04 DIAGNOSIS — Z95828 Presence of other vascular implants and grafts: Secondary | ICD-10-CM | POA: Diagnosis not present

## 2018-06-04 DIAGNOSIS — D696 Thrombocytopenia, unspecified: Secondary | ICD-10-CM | POA: Diagnosis not present

## 2018-06-04 DIAGNOSIS — I252 Old myocardial infarction: Secondary | ICD-10-CM | POA: Diagnosis not present

## 2018-06-04 DIAGNOSIS — J9601 Acute respiratory failure with hypoxia: Secondary | ICD-10-CM | POA: Diagnosis not present

## 2018-06-18 ENCOUNTER — Ambulatory Visit (INDEPENDENT_AMBULATORY_CARE_PROVIDER_SITE_OTHER): Payer: Medicare HMO | Admitting: *Deleted

## 2018-06-18 DIAGNOSIS — E538 Deficiency of other specified B group vitamins: Secondary | ICD-10-CM

## 2018-06-18 MED ORDER — CYANOCOBALAMIN 1000 MCG/ML IJ SOLN
1000.0000 ug | Freq: Once | INTRAMUSCULAR | Status: AC
Start: 2018-06-18 — End: 2018-06-18
  Administered 2018-06-18: 1000 ug via INTRAMUSCULAR

## 2018-06-24 NOTE — Progress Notes (Addendum)
Per orders of Dr Betty Martinique in Dr Marthann Schiller absence, injection of B12 given by Varney Daily M on 06/18/18 Patient tolerated injection well.

## 2018-07-05 ENCOUNTER — Inpatient Hospital Stay: Payer: Medicare HMO | Attending: Oncology

## 2018-07-05 VITALS — BP 140/66 | HR 65 | Temp 98.3°F | Resp 18

## 2018-07-05 DIAGNOSIS — Z452 Encounter for adjustment and management of vascular access device: Secondary | ICD-10-CM | POA: Diagnosis not present

## 2018-07-05 DIAGNOSIS — C9201 Acute myeloblastic leukemia, in remission: Secondary | ICD-10-CM | POA: Diagnosis not present

## 2018-07-05 DIAGNOSIS — Z95828 Presence of other vascular implants and grafts: Secondary | ICD-10-CM

## 2018-07-05 MED ORDER — SODIUM CHLORIDE 0.9 % IJ SOLN
10.0000 mL | INTRAMUSCULAR | Status: DC | PRN
  Administered 2018-07-05: 10 mL via INTRAVENOUS
  Filled 2018-07-05: qty 10

## 2018-07-05 MED ORDER — HEPARIN SOD (PORK) LOCK FLUSH 100 UNIT/ML IV SOLN
500.0000 [IU] | Freq: Once | INTRAVENOUS | Status: AC | PRN
  Administered 2018-07-05: 500 [IU] via INTRAVENOUS
  Filled 2018-07-05: qty 5

## 2018-07-05 NOTE — Patient Instructions (Signed)
Implanted Port Home Guide An implanted port is a type of central line that is placed under the skin. Central lines are used to provide IV access when treatment or nutrition needs to be given through a person's veins. Implanted ports are used for long-term IV access. An implanted port may be placed because:  You need IV medicine that would be irritating to the small veins in your hands or arms.  You need long-term IV medicines, such as antibiotics.  You need IV nutrition for a long period.  You need frequent blood draws for lab tests.  You need dialysis.  Implanted ports are usually placed in the chest area, but they can also be placed in the upper arm, the abdomen, or the leg. An implanted port has two main parts:  Reservoir. The reservoir is round and will appear as a small, raised area under your skin. The reservoir is the part where a needle is inserted to give medicines or draw blood.  Catheter. The catheter is a thin, flexible tube that extends from the reservoir. The catheter is placed into a large vein. Medicine that is inserted into the reservoir goes into the catheter and then into the vein.  How will I care for my incision site? Do not get the incision site wet. Bathe or shower as directed by your health care provider. How is my port accessed? Special steps must be taken to access the port:  Before the port is accessed, a numbing cream can be placed on the skin. This helps numb the skin over the port site.  Your health care provider uses a sterile technique to access the port. ? Your health care provider must put on a mask and sterile gloves. ? The skin over your port is cleaned carefully with an antiseptic and allowed to dry. ? The port is gently pinched between sterile gloves, and a needle is inserted into the port.  Only "non-coring" port needles should be used to access the port. Once the port is accessed, a blood return should be checked. This helps ensure that the port  is in the vein and is not clogged.  If your port needs to remain accessed for a constant infusion, a clear (transparent) bandage will be placed over the needle site. The bandage and needle will need to be changed every week, or as directed by your health care provider.  Keep the bandage covering the needle clean and dry. Do not get it wet. Follow your health care provider's instructions on how to take a shower or bath while the port is accessed.  If your port does not need to stay accessed, no bandage is needed over the port.  What is flushing? Flushing helps keep the port from getting clogged. Follow your health care provider's instructions on how and when to flush the port. Ports are usually flushed with saline solution or a medicine called heparin. The need for flushing will depend on how the port is used.  If the port is used for intermittent medicines or blood draws, the port will need to be flushed: ? After medicines have been given. ? After blood has been drawn. ? As part of routine maintenance.  If a constant infusion is running, the port may not need to be flushed.  How long will my port stay implanted? The port can stay in for as long as your health care provider thinks it is needed. When it is time for the port to come out, surgery will be   done to remove it. The procedure is similar to the one performed when the port was put in. When should I seek immediate medical care? When you have an implanted port, you should seek immediate medical care if:  You notice a bad smell coming from the incision site.  You have swelling, redness, or drainage at the incision site.  You have more swelling or pain at the port site or the surrounding area.  You have a fever that is not controlled with medicine.  This information is not intended to replace advice given to you by your health care provider. Make sure you discuss any questions you have with your health care provider. Document  Released: 11/24/2005 Document Revised: 05/01/2016 Document Reviewed: 08/01/2013 Elsevier Interactive Patient Education  2017 [employer]  

## 2018-07-20 ENCOUNTER — Ambulatory Visit (INDEPENDENT_AMBULATORY_CARE_PROVIDER_SITE_OTHER): Payer: Medicare HMO | Admitting: Internal Medicine

## 2018-07-20 ENCOUNTER — Encounter: Payer: Self-pay | Admitting: Internal Medicine

## 2018-07-20 VITALS — BP 138/50 | HR 71 | Temp 98.1°F | Wt 202.0 lb

## 2018-07-20 DIAGNOSIS — I10 Essential (primary) hypertension: Secondary | ICD-10-CM

## 2018-07-20 DIAGNOSIS — J029 Acute pharyngitis, unspecified: Secondary | ICD-10-CM

## 2018-07-20 DIAGNOSIS — I5042 Chronic combined systolic (congestive) and diastolic (congestive) heart failure: Secondary | ICD-10-CM

## 2018-07-20 DIAGNOSIS — N184 Chronic kidney disease, stage 4 (severe): Secondary | ICD-10-CM

## 2018-07-20 DIAGNOSIS — G8929 Other chronic pain: Secondary | ICD-10-CM | POA: Diagnosis not present

## 2018-07-20 DIAGNOSIS — E538 Deficiency of other specified B group vitamins: Secondary | ICD-10-CM

## 2018-07-20 DIAGNOSIS — M549 Dorsalgia, unspecified: Secondary | ICD-10-CM

## 2018-07-20 DIAGNOSIS — E119 Type 2 diabetes mellitus without complications: Secondary | ICD-10-CM | POA: Diagnosis not present

## 2018-07-20 DIAGNOSIS — Z794 Long term (current) use of insulin: Secondary | ICD-10-CM

## 2018-07-20 DIAGNOSIS — IMO0001 Reserved for inherently not codable concepts without codable children: Secondary | ICD-10-CM

## 2018-07-20 LAB — POCT GLYCOSYLATED HEMOGLOBIN (HGB A1C): HEMOGLOBIN A1C: 9.5 % — AB (ref 4.0–5.6)

## 2018-07-20 LAB — POCT RAPID STREP A (OFFICE): RAPID STREP A SCREEN: NEGATIVE

## 2018-07-20 MED ORDER — CYANOCOBALAMIN 1000 MCG/ML IJ SOLN
1000.0000 ug | Freq: Once | INTRAMUSCULAR | Status: AC
  Administered 2018-07-20: 1000 ug via INTRAMUSCULAR

## 2018-07-20 MED ORDER — HYDROCODONE-ACETAMINOPHEN 5-325 MG PO TABS: 1.0000 | ORAL_TABLET | Freq: Four times a day (QID) | ORAL | 0 refills | Status: DC | PRN

## 2018-07-20 MED ORDER — INSULIN GLARGINE 300 UNIT/ML ~~LOC~~ SOPN: 56.0000 [IU] | PEN_INJECTOR | Freq: Every day | SUBCUTANEOUS | 6 refills | Status: DC

## 2018-07-20 NOTE — Patient Instructions (Signed)
Increase basal insulin from 50 to 56 units at bedtime. If fasting blood sugars remain greater than 150 increased to 60 units  If blood sugars prior to your evening basal insulin dose are consistently less than 100, decrease mealtime insulin prior to your evening meal to 16 units  Return in 3 months for follow-up  Hematology follow-up as scheduled

## 2018-07-20 NOTE — Progress Notes (Signed)
Subjective:    Patient ID: [name], female    DOB: 1941/02/05, 77 y.o.   MRN: [id number]  HPI  Lab Results  Component Value Date   HGBA1C 8.6 04/20/2018   77 year old patient who is seen today for follow-up.  She came in a bit early due to a sore throat for the last 4 days.  She denies any fever.  She is also seen today for follow-up of diabetes.  Hemoglobin A1c has increased to 9.5.  She states that 3 times over the past month blood sugars prior to her evening basal insulin in the 50 range and she has had to hold basal insulin.  When she does this fasting blood sugars the following day are generally in the 150 range. She states that ordinarily fasting blood sugars are in the 1 90-200 range.  She remains on mealtime insulin 20 units 3 times daily prior to meals.  She is also seen today for pain management evaluation per opioid protocol  Marina printed, initialed  and scanned into the EMR.   Indication for chronic opioid: She continues to have chronic low back pain. Medication and dose: She remains on hydrocodone 5 mg 4 times daily.  She has been refilling hydrocodone on a monthly basis # pills per month: 120 pills/month Last UDS date: January 19, 2018 Opioid Treatment Agreement signed (Y/N): January 19, 2018 Opioid Treatment Agreement last reviewed with patient:  January 19, 2018 Fraser reviewed this encounter (include red flags):  Yes overall risk score  220     Review of Systems  Constitutional: Negative.   HENT: Positive for sore throat. Negative for congestion, dental problem, hearing loss, rhinorrhea, sinus pressure and tinnitus.   Eyes: Negative for pain, discharge and visual disturbance.  Respiratory: Negative for cough and shortness of breath.   Cardiovascular: Negative for chest pain, palpitations and leg swelling.  Gastrointestinal: Negative for abdominal distention, abdominal pain, blood in stool, constipation, diarrhea, nausea and vomiting.    Genitourinary: Negative for difficulty urinating, dysuria, flank pain, frequency, hematuria, pelvic pain, urgency, vaginal bleeding, vaginal discharge and vaginal pain.  Musculoskeletal: Negative for arthralgias, gait problem and joint swelling.  Skin: Negative for rash.  Neurological: Negative for dizziness, syncope, speech difficulty, weakness, numbness and headaches.  Hematological: Negative for adenopathy.  Psychiatric/Behavioral: Negative for agitation, behavioral problems and dysphoric mood. The patient is not nervous/anxious.        Objective:   Physical Exam  Constitutional: She is oriented to person, place, and time. She appears well-developed and well-nourished. No distress.  HENT:  Head: Normocephalic.  Right Ear: External ear normal.  Left Ear: External ear normal.  Oropharynx erythematous  Eyes: Pupils are equal, round, and reactive to light. Conjunctivae and EOM are normal.  Neck: Normal range of motion. Neck supple. No thyromegaly present.  No cervical adenopathy  Cardiovascular: Normal rate, regular rhythm, normal heart sounds and intact distal pulses.  Pulmonary/Chest: Effort normal and breath sounds normal.  Abdominal: Soft. Bowel sounds are normal. She exhibits no mass. There is no tenderness.  Musculoskeletal: Normal range of motion.  Lymphadenopathy:    She has no cervical adenopathy.  Neurological: She is alert and oriented to person, place, and time.  Skin: Skin is warm and dry. No rash noted.  Psychiatric: She has a normal mood and affect. Her behavior is normal.          Assessment & Plan:   Pharyngitis.  Will review a screen for group A beta hemolytic  strep.  Antibiotic therapy if indicated  Diabetes mellitus.  Hemoglobin A1c has increased to 9.5.  Fasting blood sugars are consistently elevated will increase basal insulin from 50 to 56 units.  Patient will add an additional 4 units if fasting blood sugars remain greater than 150  Encounter for  chronic pain management (G89.29) Narcotic use  (711.90)  B12 deficiency.  Parenteral B12 given  Marletta Lor

## 2018-07-21 ENCOUNTER — Ambulatory Visit: Payer: Medicare HMO | Admitting: Internal Medicine

## 2018-07-23 ENCOUNTER — Telehealth: Payer: Self-pay

## 2018-07-23 NOTE — Telephone Encounter (Signed)
Returned call to pt regarding symptoms. Pt called to report that she was recently seen in urgent care re: "sore throat and white spots on tonsils". Pt states that they said she does not have strep, but she still "feels bad". This RN LVM for pt to return call to clinic.

## 2018-08-10 ENCOUNTER — Other Ambulatory Visit: Payer: Self-pay | Admitting: Internal Medicine

## 2018-08-16 ENCOUNTER — Telehealth: Payer: Self-pay | Admitting: Oncology

## 2018-08-16 ENCOUNTER — Encounter: Payer: Self-pay | Admitting: Oncology

## 2018-08-16 ENCOUNTER — Other Ambulatory Visit: Payer: Medicare HMO

## 2018-08-16 ENCOUNTER — Ambulatory Visit: Payer: Medicare HMO | Admitting: Oncology

## 2018-08-16 ENCOUNTER — Inpatient Hospital Stay: Payer: Medicare HMO

## 2018-08-16 ENCOUNTER — Inpatient Hospital Stay (HOSPITAL_BASED_OUTPATIENT_CLINIC_OR_DEPARTMENT_OTHER): Payer: Medicare HMO | Admitting: Oncology

## 2018-08-16 ENCOUNTER — Inpatient Hospital Stay: Payer: Medicare HMO | Attending: Oncology

## 2018-08-16 VITALS — BP 120/58 | HR 70 | Temp 98.6°F | Resp 19 | Ht 62.0 in | Wt 196.9 lb

## 2018-08-16 DIAGNOSIS — E1165 Type 2 diabetes mellitus with hyperglycemia: Secondary | ICD-10-CM | POA: Insufficient documentation

## 2018-08-16 DIAGNOSIS — N289 Disorder of kidney and ureter, unspecified: Secondary | ICD-10-CM | POA: Insufficient documentation

## 2018-08-16 DIAGNOSIS — Z95828 Presence of other vascular implants and grafts: Secondary | ICD-10-CM

## 2018-08-16 DIAGNOSIS — C9201 Acute myeloblastic leukemia, in remission: Secondary | ICD-10-CM

## 2018-08-16 DIAGNOSIS — Z452 Encounter for adjustment and management of vascular access device: Secondary | ICD-10-CM | POA: Insufficient documentation

## 2018-08-16 DIAGNOSIS — C9501 Acute leukemia of unspecified cell type, in remission: Secondary | ICD-10-CM

## 2018-08-16 LAB — CBC WITH DIFFERENTIAL (CANCER CENTER ONLY)
BASOS ABS: 0 10*3/uL (ref 0.0–0.1)
BASOS PCT: 0 %
EOS ABS: 0.1 10*3/uL (ref 0.0–0.5)
Eosinophils Relative: 1 %
HCT: 34.2 % — ABNORMAL LOW (ref 34.8–46.6)
Hemoglobin: 11.5 g/dL — ABNORMAL LOW (ref 11.6–15.9)
Lymphocytes Relative: 25 %
Lymphs Abs: 1.9 10*3/uL (ref 0.9–3.3)
MCH: 31.4 pg (ref 25.1–34.0)
MCHC: 33.6 g/dL (ref 31.5–36.0)
MCV: 93.4 fL (ref 79.5–101.0)
MONOS PCT: 9 %
Monocytes Absolute: 0.7 10*3/uL (ref 0.1–0.9)
Neutro Abs: 5 10*3/uL (ref 1.5–6.5)
Neutrophils Relative %: 65 %
Platelet Count: 153 10*3/uL (ref 145–400)
RBC: 3.66 MIL/uL — ABNORMAL LOW (ref 3.70–5.45)
RDW: 13.4 % (ref 11.2–14.5)
WBC: 7.7 10*3/uL (ref 3.9–10.3)

## 2018-08-16 LAB — CMP (CANCER CENTER ONLY)
ALK PHOS: 163 U/L — AB (ref 38–126)
ALT: 37 U/L (ref 0–44)
AST: 42 U/L — ABNORMAL HIGH (ref 15–41)
Albumin: 3.3 g/dL — ABNORMAL LOW (ref 3.5–5.0)
Anion gap: 12 (ref 5–15)
BILIRUBIN TOTAL: 0.5 mg/dL (ref 0.3–1.2)
BUN: 30 mg/dL — ABNORMAL HIGH (ref 8–23)
CALCIUM: 8.5 mg/dL — AB (ref 8.9–10.3)
CO2: 23 mmol/L (ref 22–32)
CREATININE: 2.32 mg/dL — AB (ref 0.44–1.00)
Chloride: 102 mmol/L (ref 98–111)
GFR, EST AFRICAN AMERICAN: 22 mL/min — AB (ref >60)
GFR, EST NON AFRICAN AMERICAN: 19 mL/min — AB (ref >60)
Glucose, Bld: 578 mg/dL (ref 70–99)
Potassium: 3.9 mmol/L (ref 3.5–5.1)
Sodium: 137 mmol/L (ref 135–145)
TOTAL PROTEIN: 6.3 g/dL — AB (ref 6.5–8.1)

## 2018-08-16 MED ORDER — HEPARIN SOD (PORK) LOCK FLUSH 100 UNIT/ML IV SOLN
500.0000 [IU] | Freq: Once | INTRAVENOUS | Status: AC | PRN
  Administered 2018-08-16: 500 [IU] via INTRAVENOUS
  Filled 2018-08-16: qty 5

## 2018-08-16 MED ORDER — SODIUM CHLORIDE 0.9 % IJ SOLN
10.0000 mL | INTRAMUSCULAR | Status: DC | PRN
  Administered 2018-08-16: 10 mL via INTRAVENOUS
  Filled 2018-08-16: qty 10

## 2018-08-16 NOTE — Telephone Encounter (Signed)
Scheduled appt per 9/9 los - gave patient AVS and calender per los.   

## 2018-08-16 NOTE — Progress Notes (Signed)
  [facility] OFFICE PROGRESS NOTE   Diagnosis: AML  INTERVAL HISTORY:   [name] returns as scheduled.  She feels well.  She had a recent sore throat.  No complaint today.  She was last seen at [facility] in June.  She is scheduled to see Dr. Florene Glen again 25 2019  Objective:  Vital signs in last 24 hours:  Blood pressure (!) 120/58, pulse 70, temperature 98.6 F (37 C), temperature source Oral, resp. rate 19, height 5\' 2"  (1.575 m), weight 196 lb 14.4 oz (89.3 kg), SpO2 97 %.    HEENT: No thrush or ulcers.  Pharynx without erythema or exudate Resp: Lungs clear bilaterally Cardio: Distant heart sounds, regular rhythm with premature beats GI: No hepatosplenomegaly Vascular: No leg edema   Portacath/PICC-without erythema  Lab Results:  Lab Results  Component Value Date   WBC 7.7 08/16/2018   HGB 11.5 (L) 08/16/2018   HCT 34.2 (L) 08/16/2018   MCV 93.4 08/16/2018   PLT 153 08/16/2018   NEUTROABS 5.0 08/16/2018    CMP  Lab Results  Component Value Date   NA 137 08/16/2018   K 3.9 08/16/2018   CL 102 08/16/2018   CO2 23 08/16/2018   GLUCOSE 578 (HH) 08/16/2018   BUN 30 (H) 08/16/2018   CREATININE 2.32 (H) 08/16/2018   CALCIUM 8.5 (L) 08/16/2018   PROT 6.3 (L) 08/16/2018   ALBUMIN 3.3 (L) 08/16/2018   AST 42 (H) 08/16/2018   ALT 37 08/16/2018   ALKPHOS 163 (H) 08/16/2018   BILITOT 0.5 08/16/2018   GFRNONAA 19 (L) 08/16/2018   GFRAA 22 (L) 08/16/2018     Medications: I have reviewed the patient's current medications.   Assessment/Plan: 1. 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  Status post  cycle 3 high-dose araC consolidation 08/22/2014.  Bone marrow 09/15/2014 showed necrotic marrow with minute focus of myeloid precursors. No evidence of viable acute leukemia. 2.  History of pancytopenia secondary to chemotherapy.  3. C. difficile colitis-06/09/2014 treated with Flagyl  4. Diabetes  5. renal insufficiency  6. history of hypokalemia and hypomagnesemia. 7. history of atrial fibrillation  9. history of coronary artery disease  10. Hospitalization with febrile neutropenia/sepsis secondary to Escherichia coli bacteremia 09/02/2014. 11. Shingles involving the right back October 2015. 12. Meningioma on brain CT 05/24/2014 and brain MRI 05/24/2014 13. Hospitalization09/17/2018 through 08/28/2017 with acute CHF. 14.Admission 11/21/2017 with expressive aphasia, seizure activity noted on EEG, placed on Keppra     Disposition: [name] remains in clinical remission from AML.  The Port-A-Cath was flushed today.  She will see Dr. Florene Glen for an office visit and Port-A-Cath flush in October.  She will return for an office visit here in December.  The blood sugar is markedly elevated today.  She says that she will manage this at home with an insulin sliding scale.  The creatinine is more elevated today.  We will forward the chemistry panel to her primary physician as this may be need to be repeated this week.  15 minutes were spent with the patient today.  The majority of the time was used for counseling and coordination of care.  Betsy Coder, MD  08/16/2018  4:57 PM

## 2018-08-17 ENCOUNTER — Ambulatory Visit: Payer: Medicare HMO | Admitting: Physician Assistant

## 2018-08-17 ENCOUNTER — Encounter: Payer: Self-pay | Admitting: Physician Assistant

## 2018-08-17 ENCOUNTER — Telehealth: Payer: Self-pay | Admitting: *Deleted

## 2018-08-17 VITALS — BP 116/58 | HR 81 | Ht 62.0 in | Wt 198.1 lb

## 2018-08-17 DIAGNOSIS — R4701 Aphasia: Secondary | ICD-10-CM | POA: Diagnosis not present

## 2018-08-17 DIAGNOSIS — N184 Chronic kidney disease, stage 4 (severe): Secondary | ICD-10-CM

## 2018-08-17 DIAGNOSIS — I5042 Chronic combined systolic (congestive) and diastolic (congestive) heart failure: Secondary | ICD-10-CM

## 2018-08-17 DIAGNOSIS — I10 Essential (primary) hypertension: Secondary | ICD-10-CM

## 2018-08-17 DIAGNOSIS — E785 Hyperlipidemia, unspecified: Secondary | ICD-10-CM | POA: Diagnosis not present

## 2018-08-17 DIAGNOSIS — I25119 Atherosclerotic heart disease of native coronary artery with unspecified angina pectoris: Secondary | ICD-10-CM

## 2018-08-17 MED ORDER — ASPIRIN EC 81 MG PO TBEC: 81.0000 mg | DELAYED_RELEASE_TABLET | Freq: Every day | ORAL | Status: DC

## 2018-08-17 MED ORDER — ISOSORBIDE MONONITRATE ER 30 MG PO TB24: 45.0000 mg | ORAL_TABLET | Freq: Every day | ORAL | 3 refills | Status: DC

## 2018-08-17 NOTE — Patient Instructions (Addendum)
Medication Instructions:  1. DECREASE ASPIRIN TO 81 MG DAILY  2. INCREASE IMDUR TO 45 MG DAILY; THIS WILL BE 1 AND 1/2 TABS ONCE A DAY  Labwork: NONE ORDERED TODAY  Testing/Procedures: NONE ORDERED TODAY  Follow-Up: DR. Acie Fredrickson ON 10/12/18 @ 3:20 PM   Any Other Special Instructions Will Be Listed Below (If Applicable).     If you need a refill on your cardiac medications before your next appointment, please call your pharmacy.

## 2018-08-17 NOTE — Telephone Encounter (Signed)
err

## 2018-08-17 NOTE — Progress Notes (Signed)
Cardiology Office Note:    Date:  08/17/2018   ID:  [name], DOB August 11, 1941, MRN [id number]  PCP:  Marletta Lor, MD  Cardiologist:  Mertie Moores, MD   Electrophysiologist:  None  Oncologist: Dr. Jerrye Noble [facility] Dr. [name] ([facility]) Pulmonologist: Dr. Halford Chessman  Referring MD: Marletta Lor, MD   Chief Complaint  Patient presents with  . Follow-up    CAD, CHF    History of Present Illness:    [name] is a 77 y.o. female with coronary artery disease, acute myelogenous leukemia in remission since 2015, chronic kidney disease stage IV, chronic anemia, COPD, atrial fibrillation, combined systolic and diastolic heart failure.  She had a non-ST elevation myocardial infarction in September 2018 in the setting of acute on chronic combined systolic and diastolic heart failure.  EF was 40-45%.  She was initially treated medically due to her chronic kidney disease.  However, due to ongoing symptoms, she underwent PCI with a drug-eluting stent to the proximal RCA.  Initially, it was felt that the LAD could be intervened on if she had refractory angina.  However, it was later felt that the LAD was not amenable to PCI and medical therapy was continued.  She was admitted to the hospital with aphasia December 2018.  She was initially worked up for a TIA.  However, she was ultimately placed on antiepileptic therapy.  She was last seen by Dr. Acie Fredrickson in 02/2018.    [name] returns for follow-up.  She is here today with her husband.  She notes some episodes of chest tightness recently.  She had some discomfort today after getting here.  Her symptoms are now resolved.  She denies any significant change in her shortness of breath.  She does not feel that her chest symptoms are getting worse.  They are not like her previous angina.  She denies syncope, PND, lower extremity swelling.  She denies any significant cough, wheezing or bleeding issues.  Of note, she ran out of her Keppra several  months ago and has not resumed it.  She has not followed up with neurology.  Prior CV studies:   The following studies were reviewed today:  Carotid US 11/23/17 Final Interpretation: Right Carotid: There is evidence in the right ICA of a 1-39% stenosis. Left Carotid: There is evidence in the left ICA of a 1-39% stenosis.  Cardiac catheterization/PCI 09/21/17 2.5 x 23 mm Sierra DES to the proximal-mid RCA  R/L cardiac catheterization 09/17/17 LM ostial 40, 50 LAD proximal 60, mid 50 LCx mid 40 RCA proximal 80 LVEDP 20, mean RA 10, RV 40/12, PA 40/15 (mean 23), PCWP 20  Echocardiogram 08/24/17 Moderate concentric LVH, EF 40-45, mid apical hypokinesis, apical akinesis, trivial MR/TR, PASP 51  Echocardiogram 08/27/16 EF 96-75, grade 1 diastolic dysfunction, mild aortic stenosis (mean 13), normal RV SF  Carotid US 05/24/14 Bilateral ICA 1-39  Nuclear stress test 02/05/14 IMPRESSION: 1. No scintigraphic evidence of prior infarction or pharmacologically induced ischemia. 2. Normal wall motion. Ejection fraction is 63%.  Past Medical History:  Diagnosis Date  . acute myeloblastic leukemia (AML) dx'd 06/2014  . Acute pancreatitis   . Arthritis    "back" (09/17/2017)  . Atrial flutter ([place])   . Cardiomyopathy ([place])   . Chronic kidney disease, stage II (mild)   . Chronic lower back pain   . Colon polyps 04/29/2010   TUBULAR ADENOMA AND A SERRATED ADENOMA  . COPD (chronic obstructive pulmonary disease) ([place])   .  CORONARY ARTERY DISEASE 12/24/2007  . DIABETES MELLITUS, TYPE II 07/13/2007  . History of blood transfusion 2015   "related to leukemia"  . History of kidney stones 12/24/2007  . HYPERLIPIDEMIA 12/24/2007  . HYPERTENSION 07/13/2007  . NSTEMI (non-ST elevated myocardial infarction) ([place]) 09/17/2017  . Unspecified disease of pancreas   . Uterine cancer [facility])    Surgical Hx: The patient  has a past surgical history that includes Excisional hemorrhoidectomy (X 2); Nasal  sinus surgery; Cataract extraction w/ intraocular lens  implant, bilateral (Bilateral); Dilation and curettage of uterus; Abdominal hysterectomy; Tubal ligation; Cardiac catheterization (2002); Cardiac catheterization (09/17/2017); Ganglion cyst excision (Left, X 3); Colonoscopy w/ biopsies and polypectomy (2011); PORTA CATH INSERTION (Right, 2013); RIGHT/LEFT HEART CATH AND CORONARY ANGIOGRAPHY (N/A, 09/17/2017); INTRAVASCULAR PRESSURE WIRE/FFR STUDY (N/A, 09/17/2017); and CORONARY STENT INTERVENTION (N/A, 09/21/2017).   Current Medications: Current Meds  Medication Sig  . amLODipine (NORVASC) 5 MG tablet Take 1 tablet (5 mg total) by mouth daily.  Marland Kitchen atorvastatin (LIPITOR) 40 MG tablet Take 1 tablet (40 mg total) by mouth daily.  . clopidogrel (PLAVIX) 75 MG tablet Take 1 tablet (75 mg total) by mouth every evening.  . fluticasone furoate-vilanterol (BREO ELLIPTA) 200-25 MCG/INH AEPB Inhale 1 puff into the lungs daily.  . furosemide (LASIX) 40 MG tablet Take 40 mg by mouth daily as needed for edema.  Marland Kitchen HYDROcodone-acetaminophen (NORCO/VICODIN) 5-325 MG tablet Take 1 tablet by mouth every 6 (six) hours as needed for moderate pain.  . Insulin Glargine (TOUJEO SOLOSTAR) 300 UNIT/ML SOPN Inject 56 Units into the skin at bedtime.  . Insulin Pen Needle (PEN NEEDLES 3/16") 31G X 5 MM MISC Use with Novolin R  . insulin regular (NOVOLIN R) 100 units/mL injection INJECT 20 UNITS SUBCUTANEOUSLY 3 TIMES DAILY. INJECT 4 EXTRA UNITS IF CBG OVER 200. INJECT 6 EXTRA UNITS IF CBG OVER 300  . Insulin Syringes, Disposable, U-100 0.3 ML MISC Use three times per day  . LORazepam (ATIVAN) 0.5 MG tablet TAKE 1 TABLET BY MOUTH EVERY 8 HOURS AS NEEDED FOR ANXIETY(DO NOT EXCEED MORE THAN 2 TABS IN 24 HRS)  . MAGNESIUM OXIDE 400 PO Take 400 mg by mouth 2 (two) times daily.  . metoprolol succinate (TOPROL XL) 100 MG 24 hr tablet Take 1 tablet (100 mg total) by mouth daily. Take with or immediately following a meal.  .  nitroGLYCERIN (NITROSTAT) 0.4 MG SL tablet Place 1 tablet (0.4 mg total) under the tongue every 5 (five) minutes as needed for chest pain.  . polyethylene glycol powder (GLYCOLAX/MIRALAX) powder Take 17 grams by mouth in 8 ounces of water one to two times daily for constipation.  . promethazine (PHENERGAN) 12.5 MG tablet Take 2 tablets (25 mg total) by mouth every 6 (six) hours as needed for nausea.  . [DISCONTINUED] aspirin 325 MG tablet Take 325 mg by mouth daily.  . [DISCONTINUED] isosorbide mononitrate (IMDUR) 30 MG 24 hr tablet Take 1 tablet (30 mg total) by mouth daily.     Allergies:   Patient has no known allergies.   Social History   Tobacco Use  . Smoking status: Former Smoker    Packs/day: 1.00    Years: 20.00    Pack years: 20.00    Types: Cigarettes    Last attempt to quit: 12/08/2000    Years since quitting: 17.7  . Smokeless tobacco: Never Used  Substance Use Topics  . Alcohol use: No    Alcohol/week: 0.0 standard drinks  . Drug use:  No     Family Hx: The patient's family history includes COPD in her father; Cancer in her brother, father, and sister; Congestive Heart Failure in her mother; Heart attack in her maternal grandmother. There is no history of Colon cancer.  ROS:   Please see the history of present illness.    Review of Systems  Musculoskeletal: Positive for back pain.   All other systems reviewed and are negative.   EKGs/Labs/Other Test Reviewed:    EKG:  EKG is  ordered today.  The ekg ordered today demonstrates normal sinus rhythm, heart rate 81, normal axis, T wave inversions 2, 3, aVF, QTC 429, similar to prior tracings  Recent Labs: 08/24/2017: B Natriuretic Peptide 357.9; TSH 6.197 08/16/2018: ALT 37; BUN 30; Creatinine 2.32; Hemoglobin 11.5; Platelet Count 153; Potassium 3.9; Sodium 137   Recent Lipid Panel Lab Results  Component Value Date/Time   CHOL 130 02/17/2018 10:05 AM   TRIG 161 (H) 02/17/2018 10:05 AM   HDL 38 (L) 02/17/2018 10:05  AM   CHOLHDL 3.4 02/17/2018 10:05 AM   CHOLHDL 4.5 11/22/2017 03:54 AM   LDLCALC 60 02/17/2018 10:05 AM    Physical Exam:    VS:  BP (!) 116/58   Pulse 81   Ht 5\' 2"  (1.575 m)   Wt 198 lb 1.9 oz (89.9 kg)   BMI 36.24 kg/m     Wt Readings from Last 3 Encounters:  08/17/18 198 lb 1.9 oz (89.9 kg)  08/16/18 196 lb 14.4 oz (89.3 kg)  07/20/18 202 lb (91.6 kg)     Physical Exam  Constitutional: She is oriented to person, place, and time. She appears well-developed and well-nourished. No distress.  HENT:  Head: Normocephalic and atraumatic.  Eyes: No scleral icterus.  Neck: No thyromegaly present.  Cardiovascular: Normal rate and regular rhythm.  No murmur heard. Pulmonary/Chest: Effort normal. She has no wheezes. She has no rales.  Abdominal: Soft. She exhibits no distension.  Musculoskeletal: She exhibits no edema.  Lymphadenopathy:    She has no cervical adenopathy.  Neurological: She is alert and oriented to person, place, and time.  Skin: Skin is warm and dry.  Psychiatric: She has a normal mood and affect.    ASSESSMENT & PLAN:    Coronary artery disease involving native coronary artery of native heart with angina pectoris [facility]) Status post prior non-ST elevation myocardial infarction in September 2018.  She has three-vessel CAD and has undergone PCI with a DES to the RCA.  She has diffuse disease in the LAD which is not amenable to PCI.  Today, she does note recent symptoms of chest tightness.  She does have inferior T wave inversions on her ECG.  However, her ECG appears similar to her prior ECGs.  She does not feel that her symptoms are reminiscent of her previous angina.  I have recommended adjusting her antianginal therapy further close follow-up.  If her symptoms resolve, she can certainly change her follow-up to later.  She is currently on aspirin 325.  She can change this to 81 mg.  -Increase isosorbide to 45 mg daily  -Decrease aspirin 81 mg  -Continue  amlodipine, atorvastatin, clopidogrel, metoprolol succinate  -Follow-up 4-6 weeks, sooner if symptoms worsen  -Change follow-up to 6 months if symptoms are improved  Chronic combined systolic and diastolic heart failure (HCC) EF 40-45.  NYHA 3.  Volume status appears stable.  She is not on ACE inhibitor or ARB secondary to continue beta-blocker, nitrates.  CKD (chronic  kidney disease) stage 4, GFR 15-29 ml/min (HCC) Recent creatinine stable.  Essential hypertension The patient's blood pressure is controlled on her current regimen.  Continue current therapy.   Hyperlipidemia, unspecified hyperlipidemia type LDL optimal on most recent lab work.  Continue current Rx.    Expressive aphasia She was admitted in December 2018 for questionable TIA symptoms.  She was ultimately placed on Keppra due to concerns of seizure activity.  She ran out of Keppra several months ago renewed it.  I have asked her to follow-up with her primary care doctor discuss this further.   Dispo:  Return in about 6 weeks (around 09/28/2018) for Close Follow Up, w/ Dr. Acie Fredrickson, or Richardson Dopp, PA-C.   Medication Adjustments/Labs and Tests Ordered: Current medicines are reviewed at length with the patient today.  Concerns regarding medicines are outlined above.  Tests Ordered: Orders Placed This Encounter  Procedures  . EKG 12-Lead   Medication Changes: Meds ordered this encounter  Medications  . aspirin EC 81 MG tablet    Sig: Take 1 tablet (81 mg total) by mouth daily.    Order Specific Question:   Supervising Provider    Answer:   Thayer Headings [phone]  . isosorbide mononitrate (IMDUR) 30 MG 24 hr tablet    Sig: Take 1.5 tablets (45 mg total) by mouth daily.    Dispense:  135 tablet    Refill:  3    DOSE INCREASE    Signed, Richardson Dopp, PA-C  08/17/2018 5:01 PM    [facility] [place], [place], [place]  [address] Phone: [phone]; Fax: [phone]

## 2018-08-18 ENCOUNTER — Encounter: Payer: Self-pay | Admitting: Internal Medicine

## 2018-08-18 ENCOUNTER — Ambulatory Visit (INDEPENDENT_AMBULATORY_CARE_PROVIDER_SITE_OTHER): Payer: Medicare HMO | Admitting: Internal Medicine

## 2018-08-18 VITALS — BP 110/70 | HR 74 | Temp 98.1°F | Wt 193.0 lb

## 2018-08-18 DIAGNOSIS — IMO0001 Reserved for inherently not codable concepts without codable children: Secondary | ICD-10-CM

## 2018-08-18 DIAGNOSIS — N184 Chronic kidney disease, stage 4 (severe): Secondary | ICD-10-CM | POA: Diagnosis not present

## 2018-08-18 DIAGNOSIS — E119 Type 2 diabetes mellitus without complications: Secondary | ICD-10-CM

## 2018-08-18 DIAGNOSIS — I10 Essential (primary) hypertension: Secondary | ICD-10-CM | POA: Diagnosis not present

## 2018-08-18 DIAGNOSIS — Z794 Long term (current) use of insulin: Secondary | ICD-10-CM | POA: Diagnosis not present

## 2018-08-18 DIAGNOSIS — E538 Deficiency of other specified B group vitamins: Secondary | ICD-10-CM | POA: Diagnosis not present

## 2018-08-18 LAB — GLUCOSE, POCT (MANUAL RESULT ENTRY): POC Glucose: 567 mg/dl — AB (ref 70–99)

## 2018-08-18 MED ORDER — CYANOCOBALAMIN 1000 MCG/ML IJ SOLN
1000.0000 ug | Freq: Once | INTRAMUSCULAR | Status: AC
  Administered 2018-08-18: 1000 ug via INTRAMUSCULAR

## 2018-08-18 MED ORDER — INSULIN GLARGINE 300 UNIT/ML ~~LOC~~ SOPN: 40.0000 [IU] | PEN_INJECTOR | Freq: Every day | SUBCUTANEOUS | 6 refills | Status: DC

## 2018-08-18 MED ORDER — LORAZEPAM 0.5 MG PO TABS: ORAL_TABLET | ORAL | 2 refills | Status: DC

## 2018-08-18 NOTE — Progress Notes (Signed)
Subjective:    Patient ID: [name], female    DOB: May 01, 1941, 77 y.o.   MRN: [id number]  HPI 77 year old patient who is seen today as an add-on due to uncontrolled diabetes. She was seen 4 weeks ago and hemoglobin A1c had increased to 9.5.  It was suggested that she increase basal insulin from 50 to 56 units.  It was also suggested that she add sliding scale coverage to prandial short acting insulin. She continues to take 20 units prior to meals only without sliding scale coverage. She has been checking blood sugars prior to bedtime and she omits basal insulin when blood sugars are normal or low She states that she has had several episodes of hypoglycemia. Last night blood sugar was 139 prior to bedtime and she omitted basal insulin.  She awoke at 3 AM diaphoretic and blood sugar was 67.  She took some sugar pills and fasting blood sugar this morning 139.  Blood sugar this afternoon over 500 She has chronic kidney disease  Past Medical History:  Diagnosis Date  . acute myeloblastic leukemia (AML) dx'd 06/2014  . Acute pancreatitis   . Arthritis    "back" (09/17/2017)  . Atrial flutter ([place])   . Cardiomyopathy ([place])   . Chronic kidney disease, stage II (mild)   . Chronic lower back pain   . Colon polyps 04/29/2010   TUBULAR ADENOMA AND A SERRATED ADENOMA  . COPD (chronic obstructive pulmonary disease) ([place])   . CORONARY ARTERY DISEASE 12/24/2007  . DIABETES MELLITUS, TYPE II 07/13/2007  . History of blood transfusion 2015   "related to leukemia"  . History of kidney stones 12/24/2007  . HYPERLIPIDEMIA 12/24/2007  . HYPERTENSION 07/13/2007  . NSTEMI (non-ST elevated myocardial infarction) ([place]) 09/17/2017  . Unspecified disease of pancreas   . Uterine cancer [facility])      Social History   Socioeconomic History  . Marital status: Married    Spouse name: Not on file  . Number of children: Not on file  . Years of education: Not on file  . Highest education level: Not on file    Occupational History  . Occupation: works in a lab    Comment: makes glasses  Social Needs  . Financial resource strain: Not on file  . Food insecurity:    Worry: Not on file    Inability: Not on file  . Transportation needs:    Medical: Not on file    Non-medical: Not on file  Tobacco Use  . Smoking status: Former Smoker    Packs/day: 1.00    Years: 20.00    Pack years: 20.00    Types: Cigarettes    Last attempt to quit: 12/08/2000    Years since quitting: 17.7  . Smokeless tobacco: Never Used  Substance and Sexual Activity  . Alcohol use: No    Alcohol/week: 0.0 standard drinks  . Drug use: No  . Sexual activity: Never  Lifestyle  . Physical activity:    Days per week: Not on file    Minutes per session: Not on file  . Stress: Not on file  Relationships  . Social connections:    Talks on phone: Not on file    Gets together: Not on file    Attends religious service: Not on file    Active member of club or organization: Not on file    Attends meetings of clubs or organizations: Not on file    Relationship status: Not on file  .  Intimate partner violence:    Fear of current or ex partner: Not on file    Emotionally abused: Not on file    Physically abused: Not on file    Forced sexual activity: Not on file  Other Topics Concern  . 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 ABDOMINAL HYSTERECTOMY     "still have my ovaries"  . CARDIAC CATHETERIZATION  2002  . CARDIAC CATHETERIZATION  09/17/2017  . CATARACT EXTRACTION W/ INTRAOCULAR LENS  IMPLANT, BILATERAL Bilateral   . COLONOSCOPY W/ BIOPSIES AND POLYPECTOMY  2011  . CORONARY STENT INTERVENTION N/A 09/21/2017   Procedure: CORONARY STENT INTERVENTION;  Surgeon: Troy Sine, MD;  Location: [facility];  Service: Cardiovascular;  Laterality: N/A;  . DILATION AND CURETTAGE OF UTERUS    . EXCISIONAL  HEMORRHOIDECTOMY  X 2  . GANGLION CYST EXCISION Left X 3  . INTRAVASCULAR PRESSURE WIRE/FFR STUDY N/A 09/17/2017   Procedure: INTRAVASCULAR PRESSURE WIRE/FFR STUDY;  Surgeon: Nelva Bush, MD;  Location: [facility];  Service: Cardiovascular;  Laterality: N/A;  . NASAL SINUS SURGERY    . PORTA CATH INSERTION Right 2013  . RIGHT/LEFT HEART CATH AND CORONARY ANGIOGRAPHY N/A 09/17/2017   Procedure: RIGHT/LEFT HEART CATH AND CORONARY ANGIOGRAPHY;  Surgeon: Nelva Bush, MD;  Location: [facility];  Service: Cardiovascular;  Laterality: N/A;  . TUBAL LIGATION      Family History  Problem Relation Age of Onset  . Cancer Father        throat ca  . COPD Father   . Cancer Sister        uterine ca and breast ca  . Cancer Brother        throat  . Congestive Heart Failure Mother   . Heart attack Maternal Grandmother   . Colon cancer Neg Hx     No Known Allergies  Current Outpatient Medications on File Prior to Visit  Medication Sig Dispense Refill  . amLODipine (NORVASC) 5 MG tablet Take 1 tablet (5 mg total) by mouth daily. 90 tablet 1  . aspirin EC 81 MG tablet Take 1 tablet (81 mg total) by mouth daily.    Marland Kitchen atorvastatin (LIPITOR) 40 MG tablet Take 1 tablet (40 mg total) by mouth daily. 90 tablet 3  . clopidogrel (PLAVIX) 75 MG tablet Take 1 tablet (75 mg total) by mouth every evening. 90 tablet 3  . fluticasone furoate-vilanterol (BREO ELLIPTA) 200-25 MCG/INH AEPB Inhale 1 puff into the lungs daily. 120 each 5  . furosemide (LASIX) 40 MG tablet Take 40 mg by mouth daily as needed for edema.    Marland Kitchen HYDROcodone-acetaminophen (NORCO/VICODIN) 5-325 MG tablet Take 1 tablet by mouth every 6 (six) hours as needed for moderate pain. 120 tablet 0  . Insulin Pen Needle (PEN NEEDLES 3/16") 31G X 5 MM MISC Use with Novolin R 100 each 0  . insulin regular (NOVOLIN R) 100 units/mL injection INJECT 20 UNITS SUBCUTANEOUSLY 3 TIMES DAILY. INJECT 4 EXTRA UNITS IF CBG OVER 200. INJECT 6  EXTRA UNITS IF CBG OVER 300 20 mL 1  . Insulin Syringes, Disposable, U-100 0.3 ML MISC Use three times per day 100 each 0  . isosorbide mononitrate (IMDUR) 30 MG 24 hr tablet Take 1.5 tablets (45 mg total) by mouth daily. 135 tablet 3  . MAGNESIUM OXIDE 400 PO Take 400  mg by mouth 2 (two) times daily.    . metoprolol succinate (TOPROL XL) 100 MG 24 hr tablet Take 1 tablet (100 mg total) by mouth daily. Take with or immediately following a meal. 90 tablet 3  . nitroGLYCERIN (NITROSTAT) 0.4 MG SL tablet Place 1 tablet (0.4 mg total) under the tongue every 5 (five) minutes as needed for chest pain. 24 tablet 1  . polyethylene glycol powder (GLYCOLAX/MIRALAX) powder Take 17 grams by mouth in 8 ounces of water one to two times daily for constipation. 850 g 5  . promethazine (PHENERGAN) 12.5 MG tablet Take 2 tablets (25 mg total) by mouth every 6 (six) hours as needed for nausea. 12 tablet 0   Current Facility-Administered Medications on File Prior to Visit  Medication Dose Route Frequency Provider Last Rate Last Dose  . sodium chloride 0.9 % 1,000 mL with magnesium sulfate 2 g infusion   Intravenous Continuous Ladell Pier, MD   Stopped at 08/04/14 1441    BP 110/70   Pulse 74   Temp 98.1 F (36.7 C) (Oral)   Wt 193 lb (87.5 kg)   SpO2 96%   BMI 35.30 kg/m     Review of Systems  Constitutional: Positive for diaphoresis.  HENT: Negative for congestion, dental problem, hearing loss, rhinorrhea, sinus pressure, sore throat and tinnitus.   Eyes: Negative for pain, discharge and visual disturbance.  Respiratory: Negative for cough and shortness of breath.   Cardiovascular: Negative for chest pain, palpitations and leg swelling.  Gastrointestinal: Negative for abdominal distention, abdominal pain, blood in stool, constipation, diarrhea, nausea and vomiting.  Genitourinary: Negative for difficulty urinating, dysuria, flank pain, frequency, hematuria, pelvic pain, urgency, vaginal bleeding,  vaginal discharge and vaginal pain.  Musculoskeletal: Negative for arthralgias, gait problem and joint swelling.  Skin: Negative for rash.  Neurological: Positive for weakness and light-headedness. Negative for dizziness, syncope, speech difficulty, numbness and headaches.  Hematological: Negative for adenopathy.  Psychiatric/Behavioral: Negative for agitation, behavioral problems and dysphoric mood. The patient is not nervous/anxious.        Objective:   Physical Exam  Constitutional: She appears well-developed and well-nourished. No distress.          Assessment & Plan:   Poorly controlled labile diabetes.  States that she has had frequent hypoglycemia  Will decrease basal insulin to 40 units.  She was instructed to increase by 4 units every 4 days if fasting blood sugars are greater than 150 to a maximum dose of 52 units  She was asked to check blood sugars prior to lunch and dinner and to use 20 units.  If preprandial blood sugar is over 200 to take an additional 4 units and if over 300 and additional 8 units.  The patient states that she rarely has breakfast and usually lunch is her first meal  We will set up for endocrine evaluation  Marletta Lor

## 2018-08-18 NOTE — Patient Instructions (Signed)
Toujeo 40 units at bedtime.  Increase Toujeo dose by 4 units every 4 days if fasting blood sugars are consistently greater than 15  2 maximum dose of 52 units  Check blood sugars prior to lunch and dinner.  Continue 20 units prior to each meal with sliding scale coverage   Endocrinology follow-up

## 2018-08-23 ENCOUNTER — Ambulatory Visit: Payer: Medicare HMO

## 2018-08-24 ENCOUNTER — Encounter: Payer: Self-pay | Admitting: Endocrinology

## 2018-09-21 ENCOUNTER — Ambulatory Visit (INDEPENDENT_AMBULATORY_CARE_PROVIDER_SITE_OTHER): Payer: Medicare HMO

## 2018-09-21 DIAGNOSIS — Z23 Encounter for immunization: Secondary | ICD-10-CM

## 2018-09-21 DIAGNOSIS — E538 Deficiency of other specified B group vitamins: Secondary | ICD-10-CM

## 2018-09-21 MED ORDER — CYANOCOBALAMIN 1000 MCG/ML IJ SOLN
1000.0000 ug | Freq: Once | INTRAMUSCULAR | Status: AC
  Administered 2018-09-21: 1000 ug via INTRAMUSCULAR

## 2018-09-21 NOTE — Progress Notes (Signed)
Per orders of Dr. Sherren Mocha, injection of B12 and fluzone given by Rebecca Eaton. Patient tolerated injection well.

## 2018-09-22 ENCOUNTER — Encounter: Payer: Self-pay | Admitting: *Deleted

## 2018-10-01 ENCOUNTER — Other Ambulatory Visit: Payer: Self-pay | Admitting: Internal Medicine

## 2018-10-01 DIAGNOSIS — C9201 Acute myeloblastic leukemia, in remission: Secondary | ICD-10-CM | POA: Diagnosis not present

## 2018-10-01 DIAGNOSIS — E119 Type 2 diabetes mellitus without complications: Secondary | ICD-10-CM | POA: Diagnosis not present

## 2018-10-01 DIAGNOSIS — K5900 Constipation, unspecified: Secondary | ICD-10-CM | POA: Diagnosis not present

## 2018-10-01 DIAGNOSIS — I10 Essential (primary) hypertension: Secondary | ICD-10-CM | POA: Diagnosis not present

## 2018-10-01 DIAGNOSIS — I480 Paroxysmal atrial fibrillation: Secondary | ICD-10-CM | POA: Diagnosis not present

## 2018-10-01 MED ORDER — INSULIN REGULAR HUMAN 100 UNIT/ML IJ SOLN: INTRAMUSCULAR | 1 refills | Status: DC

## 2018-10-01 NOTE — Telephone Encounter (Signed)
Copied from [place] [phone]. Topic: Quick Communication - Rx Refill/Question >> Oct 01, 2018  7:35 AM [name] wrote: Medication: insulin regular (NOVOLIN R) 100 units/mL injection  90 day samples  Has the patient contacted their pharmacy? Yes.   (Agent: If no, request that the patient contact the pharmacy for the refill.) (Agent: If yes, when and what did the pharmacy advise?) call office no more refills  Preferred Pharmacy (with phone number or street name): [place], [place] - [place] [phone] (Phone) [phone] (Fax)    Agent: Please be advised that RX refills may take up to 3 business days. We ask that you follow-up with your pharmacy.

## 2018-10-01 NOTE — Telephone Encounter (Signed)
Requested medication (s) are due for refill today: yes  Requested medication (s) are on the active medication list: yes  Last refill:  06/04/18  Future visit scheduled: yes with Dr Deniece Ree 10/27/18  Notes to clinic:  Pt requesting 90 day refill   Requested Prescriptions  Pending Prescriptions Disp Refills   insulin regular (NOVOLIN R) 100 units/mL injection 20 mL 1    Sig: INJECT 20 UNITS SUBCUTANEOUSLY 3 TIMES DAILY. INJECT 4 EXTRA UNITS IF CBG OVER 200. INJECT 6 EXTRA UNITS IF CBG OVER 300     Endocrinology:  Diabetes - Insulins Failed - 10/01/2018  7:52 AM      Failed - HBA1C is between 0 and 7.9 and within 180 days    Hemoglobin A1C  Date Value Ref Range Status  07/20/2018 9.5 (A) 4.0 - 5.6 % Final  10/23/2017 9.8  Final   Hgb A1c MFr Bld  Date Value Ref Range Status  11/22/2017 12.4 (H) 4.8 - 5.6 % Final    Comment:    (NOTE)         Prediabetes: 5.7 - 6.4         Diabetes: >6.4         Glycemic control for adults with diabetes: <7.0          Passed - Valid encounter within last 6 months    Recent Outpatient Visits          1 month ago Diabetes mellitus without complication ([place])   [place] [facility], Doretha Sou, MD   2 months ago Vitamin B12 deficiency   [facility], Doretha Sou, MD   5 months ago Diabetes mellitus without complication ([place])   [facility], Doretha Sou, MD   8 months ago Essential hypertension   [facility], Doretha Sou, MD   8 months ago Opiate use   [facility], Doretha Sou, MD      Future Appointments            In 3 weeks Isaac Bliss, Rayford Halsted, MD [employer] [facility] [place], [facility]

## 2018-10-12 ENCOUNTER — Ambulatory Visit: Payer: Medicare HMO | Admitting: Cardiovascular Disease

## 2018-10-27 ENCOUNTER — Encounter: Payer: Self-pay | Admitting: *Deleted

## 2018-10-27 ENCOUNTER — Encounter: Payer: Self-pay | Admitting: Internal Medicine

## 2018-10-27 ENCOUNTER — Ambulatory Visit (INDEPENDENT_AMBULATORY_CARE_PROVIDER_SITE_OTHER): Payer: Medicare HMO | Admitting: Internal Medicine

## 2018-10-27 VITALS — BP 160/60 | HR 69 | Temp 99.3°F | Wt 201.3 lb

## 2018-10-27 DIAGNOSIS — E119 Type 2 diabetes mellitus without complications: Secondary | ICD-10-CM | POA: Diagnosis not present

## 2018-10-27 DIAGNOSIS — E538 Deficiency of other specified B group vitamins: Secondary | ICD-10-CM

## 2018-10-27 DIAGNOSIS — I10 Essential (primary) hypertension: Secondary | ICD-10-CM | POA: Diagnosis not present

## 2018-10-27 DIAGNOSIS — G40209 Localization-related (focal) (partial) symptomatic epilepsy and epileptic syndromes with complex partial seizures, not intractable, without status epilepticus: Secondary | ICD-10-CM

## 2018-10-27 DIAGNOSIS — Z794 Long term (current) use of insulin: Secondary | ICD-10-CM

## 2018-10-27 DIAGNOSIS — E785 Hyperlipidemia, unspecified: Secondary | ICD-10-CM | POA: Diagnosis not present

## 2018-10-27 DIAGNOSIS — G8929 Other chronic pain: Secondary | ICD-10-CM

## 2018-10-27 DIAGNOSIS — I2510 Atherosclerotic heart disease of native coronary artery without angina pectoris: Secondary | ICD-10-CM

## 2018-10-27 DIAGNOSIS — M549 Dorsalgia, unspecified: Secondary | ICD-10-CM | POA: Diagnosis not present

## 2018-10-27 DIAGNOSIS — C9201 Acute myeloblastic leukemia, in remission: Secondary | ICD-10-CM

## 2018-10-27 DIAGNOSIS — IMO0001 Reserved for inherently not codable concepts without codable children: Secondary | ICD-10-CM

## 2018-10-27 DIAGNOSIS — N184 Chronic kidney disease, stage 4 (severe): Secondary | ICD-10-CM

## 2018-10-27 MED ORDER — AMLODIPINE BESYLATE 5 MG PO TABS: 5.0000 mg | ORAL_TABLET | Freq: Every day | ORAL | 1 refills | Status: DC

## 2018-10-27 MED ORDER — INSULIN REGULAR HUMAN 100 UNIT/ML IJ SOLN: INTRAMUSCULAR | 1 refills | Status: DC

## 2018-10-27 MED ORDER — CLOPIDOGREL BISULFATE 75 MG PO TABS: 75.0000 mg | ORAL_TABLET | Freq: Every evening | ORAL | 1 refills | Status: DC

## 2018-10-27 MED ORDER — ISOSORBIDE MONONITRATE ER 30 MG PO TB24: 45.0000 mg | ORAL_TABLET | Freq: Every day | ORAL | 3 refills | Status: DC

## 2018-10-27 MED ORDER — FUROSEMIDE 40 MG PO TABS: 40.0000 mg | ORAL_TABLET | Freq: Every day | ORAL | 1 refills | Status: DC | PRN

## 2018-10-27 MED ORDER — INSULIN GLARGINE (2 UNIT DIAL) 300 UNIT/ML ~~LOC~~ SOPN: 40.0000 [IU] | PEN_INJECTOR | Freq: Every day | SUBCUTANEOUS | 3 refills | Status: DC

## 2018-10-27 MED ORDER — ATORVASTATIN CALCIUM 40 MG PO TABS: 40.0000 mg | ORAL_TABLET | Freq: Every day | ORAL | 1 refills | Status: DC

## 2018-10-27 MED ORDER — CYANOCOBALAMIN 1000 MCG/ML IJ SOLN
1000.0000 ug | Freq: Once | INTRAMUSCULAR | Status: AC
  Administered 2018-10-27: 1000 ug via INTRAMUSCULAR

## 2018-10-27 MED ORDER — HYDROCODONE-ACETAMINOPHEN 5-325 MG PO TABS: 1.0000 | ORAL_TABLET | Freq: Four times a day (QID) | ORAL | 0 refills | Status: DC | PRN

## 2018-10-27 MED ORDER — FLUTICASONE FUROATE-VILANTEROL 200-25 MCG/INH IN AEPB: 1.0000 | INHALATION_SPRAY | Freq: Every day | RESPIRATORY_TRACT | 5 refills | Status: DC

## 2018-10-27 MED ORDER — METOPROLOL SUCCINATE ER 100 MG PO TB24: 100.0000 mg | ORAL_TABLET | Freq: Every day | ORAL | 1 refills | Status: DC

## 2018-10-27 NOTE — Progress Notes (Signed)
Established Patient Office Visit     CC/Reason for Visit: To establish care, medication refills and diabetic management  HPI: [name] is a 77 y.o. female who is coming in today for the above mentioned reasons. Past Medical History is significant for: Labile insulin-dependent diabetes, hypertension, hyperlipidemia, AML in remission, chronic kidney disease stage IV, coronary artery disease followed by cardiology.  She mainly comes in today to discuss diabetic management.  She is concerned because 2-3 times a week she will wake up at 2 or 3:00 in the morning with diaphoresis and palpitations, she will check her CBG and it will be anywhere between 40 and 60.  It has gotten to the point where she has elected not to take her nighttime 40 units of Toujeo if her before bedtime CBG is below 150.  This happens about 3-4 times a week.  She does not eat breakfast, she uses regular insulin before lunch and before dinner which are the only 2 meals she eats in the day.  She uses 20 units with an additional 4 units if CBG is above 200 and 6 units if it is above 300.  She also wants her scheduled B12 injection today.  She would also like all of her medications refilled.   Past Medical/Surgical History: Past Medical History:  Diagnosis Date  . acute myeloblastic leukemia (AML) dx'd 06/2014  . Acute pancreatitis   . Arthritis    "back" (09/17/2017)  . Atrial flutter ([place])   . Cardiomyopathy ([place])   . Chronic kidney disease, stage II (mild)   . Chronic lower back pain   . Colon polyps 04/29/2010   TUBULAR ADENOMA AND A SERRATED ADENOMA  . COPD (chronic obstructive pulmonary disease) ([place])   . CORONARY ARTERY DISEASE 12/24/2007  . DIABETES MELLITUS, TYPE II 07/13/2007  . History of blood transfusion 2015   "related to leukemia"  . History of kidney stones 12/24/2007  . HYPERLIPIDEMIA 12/24/2007  . HYPERTENSION 07/13/2007  . NSTEMI (non-ST elevated myocardial infarction) ([place]) 09/17/2017  .  Unspecified disease of pancreas   . Uterine cancer [facility])     Past Surgical History:  Procedure Laterality Date  . ABDOMINAL HYSTERECTOMY     "still have my ovaries"  . CARDIAC CATHETERIZATION  2002  . CARDIAC CATHETERIZATION  09/17/2017  . CATARACT EXTRACTION W/ INTRAOCULAR LENS  IMPLANT, BILATERAL Bilateral   . COLONOSCOPY W/ BIOPSIES AND POLYPECTOMY  2011  . CORONARY STENT INTERVENTION N/A 09/21/2017   Procedure: CORONARY STENT INTERVENTION;  Surgeon: Troy Sine, MD;  Location: [place] [facility];  Service: Cardiovascular;  Laterality: N/A;  . DILATION AND CURETTAGE OF UTERUS    . EXCISIONAL HEMORRHOIDECTOMY  X 2  . GANGLION CYST EXCISION Left X 3  . INTRAVASCULAR PRESSURE WIRE/FFR STUDY N/A 09/17/2017   Procedure: INTRAVASCULAR PRESSURE WIRE/FFR STUDY;  Surgeon: Nelva Bush, MD;  Location: [place] [facility];  Service: Cardiovascular;  Laterality: N/A;  . NASAL SINUS SURGERY    . PORTA CATH INSERTION Right 2013  . RIGHT/LEFT HEART CATH AND CORONARY ANGIOGRAPHY N/A 09/17/2017   Procedure: RIGHT/LEFT HEART CATH AND CORONARY ANGIOGRAPHY;  Surgeon: Nelva Bush, MD;  Location: [place] [facility];  Service: Cardiovascular;  Laterality: N/A;  . TUBAL LIGATION      Social History:  reports that she quit smoking about 17 years ago. Her smoking use included cigarettes. She has a 20.00 pack-year smoking history. She has never used smokeless tobacco. She reports that she does not  drink alcohol or use drugs.  Allergies: No Known Allergies  Family History:  Family History  Problem Relation Age of Onset  . COPD Father   . Throat cancer Father   . Uterine cancer Sister        uterine ca and breast ca  . Breast cancer Sister   . Throat cancer Brother   . Suicidality Brother   . Congestive Heart Failure Mother   . Heart attack Maternal Grandmother   . Colon cancer Neg Hx      Current Outpatient Medications:  .  amLODipine (NORVASC) 5 MG tablet, Take 1 tablet (5 mg  total) by mouth daily., Disp: 90 tablet, Rfl: 1 .  aspirin EC 81 MG tablet, Take 1 tablet (81 mg total) by mouth daily., Disp: , Rfl:  .  atorvastatin (LIPITOR) 40 MG tablet, Take 1 tablet (40 mg total) by mouth daily., Disp: 90 tablet, Rfl: 3 .  clopidogrel (PLAVIX) 75 MG tablet, Take 1 tablet (75 mg total) by mouth every evening., Disp: 90 tablet, Rfl: 3 .  fluticasone furoate-vilanterol (BREO ELLIPTA) 200-25 MCG/INH AEPB, Inhale 1 puff into the lungs daily., Disp: 120 each, Rfl: 5 .  furosemide (LASIX) 40 MG tablet, Take 40 mg by mouth daily as needed for edema., Disp: , Rfl:  .  HYDROcodone-acetaminophen (NORCO/VICODIN) 5-325 MG tablet, Take 1 tablet by mouth every 6 (six) hours as needed for moderate pain., Disp: 120 tablet, Rfl: 0 .  Insulin Glargine (TOUJEO SOLOSTAR) 300 UNIT/ML SOPN, Inject 40 Units into the skin at bedtime., Disp: 1 pen, Rfl: 6 .  Insulin Pen Needle (PEN NEEDLES 3/16") 31G X 5 MM MISC, Use with Novolin R, Disp: 100 each, Rfl: 0 .  insulin regular (NOVOLIN R) 100 units/mL injection, INJECT 10 UNITS SUBCUTANEOUSLY 3 TIMES DAILY. INJECT 4 EXTRA UNITS IF CBG OVER 200. INJECT 6 EXTRA UNITS IF CBG OVER 300, Disp: 20 mL, Rfl: 1 .  Insulin Syringes, Disposable, U-100 0.3 ML MISC, Use three times per day, Disp: 100 each, Rfl: 0 .  isosorbide mononitrate (IMDUR) 30 MG 24 hr tablet, Take 1.5 tablets (45 mg total) by mouth daily., Disp: 135 tablet, Rfl: 3 .  LORazepam (ATIVAN) 0.5 MG tablet, TAKE 1 TABLET BY MOUTH EVERY 8 HOURS AS NEEDED FOR ANXIETY(DO NOT EXCEED MORE THAN 2 TABS IN 24 HRS), Disp: 60 tablet, Rfl: 2 .  MAGNESIUM OXIDE 400 PO, Take 400 mg by mouth 2 (two) times daily., Disp: , Rfl:  .  metoprolol succinate (TOPROL XL) 100 MG 24 hr tablet, Take 1 tablet (100 mg total) by mouth daily. Take with or immediately following a meal., Disp: 90 tablet, Rfl: 3 .  nitroGLYCERIN (NITROSTAT) 0.4 MG SL tablet, Place 1 tablet (0.4 mg total) under the tongue every 5 (five) minutes as  needed for chest pain., Disp: 24 tablet, Rfl: 1 .  polyethylene glycol powder (GLYCOLAX/MIRALAX) powder, Take 17 grams by mouth in 8 ounces of water one to two times daily for constipation., Disp: 850 g, Rfl: 5 .  promethazine (PHENERGAN) 12.5 MG tablet, Take 2 tablets (25 mg total) by mouth every 6 (six) hours as needed for nausea., Disp: 12 tablet, Rfl: 0 .  HYDROcodone-acetaminophen (NORCO/VICODIN) 5-325 MG tablet, Take 1 tablet by mouth every 6 (six) hours as needed for moderate pain., Disp: 120 tablet, Rfl: 0 .  HYDROcodone-acetaminophen (NORCO/VICODIN) 5-325 MG tablet, Take 1 tablet by mouth every 6 (six) hours as needed for moderate pain., Disp: 120 tablet, Rfl: 0 No current  facility-administered medications for this visit.   Facility-Administered Medications Ordered in Other Visits:  .  sodium chloride 0.9 % 1,000 mL with magnesium sulfate 2 g infusion, , Intravenous, Continuous, Ladell Pier, MD, Stopped at 08/04/14 1441  Review of Systems:  Constitutional: Denies fever, chills, diaphoresis, appetite change and fatigue.  HEENT: Denies photophobia, eye pain, redness, hearing loss, ear pain, congestion, sore throat, rhinorrhea, sneezing, mouth sores, trouble swallowing, neck pain, neck stiffness and tinnitus.   Respiratory: Denies SOB, DOE, cough, chest tightness,  and wheezing.   Cardiovascular: Denies chest pain, palpitations and leg swelling.  Gastrointestinal: Denies nausea, vomiting, abdominal pain, diarrhea, constipation, blood in stool and abdominal distention.  Genitourinary: Denies dysuria, urgency, frequency, hematuria, flank pain and difficulty urinating.  Endocrine: Denies: hot or cold intolerance, sweats, changes in hair or nails, polyuria, polydipsia. Musculoskeletal: Denies myalgias, back pain, joint swelling, arthralgias and gait problem.  Skin: Denies pallor, rash and wound.  Neurological: Denies dizziness, seizures, syncope, weakness, light-headedness, numbness and  headaches.  Hematological: Denies adenopathy. Easy bruising, personal or family bleeding history  Psychiatric/Behavioral: Denies suicidal ideation, mood changes, confusion, nervousness, sleep disturbance and agitation    Physical Exam: Vitals:   10/27/18 1635  BP: (!) 160/60  Pulse: 69  Temp: 99.3 F (37.4 C)  TempSrc: Oral  SpO2: 97%  Weight: 201 lb 4.8 oz (91.3 kg)    Body mass index is 36.82 kg/m.   Constitutional: NAD, calm, comfortable, obese Eyes: PERRL, lids and conjunctivae normal ENMT: Mucous membranes are moist. Posterior pharynx clear of any exudate or lesions. Normal dentition.  Neck: normal, supple, no masses, no thyromegaly Respiratory: clear to auscultation bilaterally, no wheezing, no crackles. Normal respiratory effort. No accessory muscle use.  Cardiovascular: Regular rate and rhythm, no murmurs / rubs / gallops. No extremity edema. 2+ pedal pulses. No carotid bruits.  Abdomen: no tenderness, no masses palpated. No hepatosplenomegaly. Bowel sounds positive.  Musculoskeletal: no clubbing / cyanosis. No joint deformity upper and lower extremities. Good ROM, no contractures. Normal muscle tone.  Skin: no rashes, lesions, ulcers. No induration Neurologic: CN 2-12 grossly intact. Sensation intact, DTR normal. Strength 5/5 in all 4.  Psychiatric: Normal judgment and insight. Alert and oriented x 3. Normal mood.    Impression and Plan:  Insulin dependent diabetes mellitus ([place])  -This is the main reason for today's visit. -I do not believe she is managing her insulin correctly. -Her A1c and majority of CBGs remain elevated with multiple episodes of nighttime hypoglycemia. -In order to address this, I have emphasized the importance of taking nighttime Toujeo, have reinforced the fact that this should not cause nighttime hypoglycemia. -Because she is having frequent nocturnal hypoglycemia, I will decrease the dose of her mealtime insulin, specifically the nighttime  dose from 20 to 10 units with sliding scale permitted for hyperglycemia. -She is instructed to take her CBGs 4 times a day and to bring CBG log to next visit. -I will see her back in 3 to 4 weeks for diabetic management.  Hyperlipidemia, unspecified hyperlipidemia type -Will address at next visit.  Essential hypertension -BP is elevated today to 160/60. -She has been taking blood pressure medication. -We will continue to address at next visit as majority of time today has been spent on diabetic management.  Coronary artery disease involving native coronary artery of native heart without angina pectoris -Followed by cardiology  Acute myeloid leukemia in remission ([place]) - Plan: HYDROcodone-acetaminophen (NORCO/VICODIN) 5-325 MG tablet, HYDROcodone-acetaminophen (NORCO/VICODIN) 5-325 MG tablet, HYDROcodone-acetaminophen (NORCO/VICODIN)  5-325 MG tablet  CKD (chronic kidney disease) stage 4, GFR 15-29 ml/min (HCC) -Will continue to address at next visit.  Vitamin B12 deficiency - Plan: cyanocobalamin ((VITAMIN B-12)) injection 1,000 mcg today  Chronic bilateral back pain, unspecified back location -[employer], initialed  and scanned into the EMR.   Indication for chronic opioid:  Chronic back pain Medication and dose:  Hydrocodone 5/325 mg every 6 hours as needed # pills per month:  120 Last UDS date:  Will request next visit Opioid Treatment Agreement signed:  Yes Opioid Treatment Agreement last reviewed with patient:  Yes [place] reviewed this encounter (include red flags):   Yes, no red flags      Patient Instructions  BEFORE YOU LEAVE: -Labs today  We have ordered labs or studies at this visit. It can take up to 1-2 weeks for results and processing. IF results require follow up or explanation, we will call you with instructions. Clinically stable results will be released to your [facility] If you have not heard from [place] or cannot find your results in [facility] in 2  weeks please contact our office at [phone].  If you are not yet signed up for [facility], please consider signing up.    -Follow up: In 4 weeks for diabetes management.  Insulin administration instructions -Take Toujeo 40 units at bedtime no matter your nighttime CBG. -Take Novolin 10 units with lunch and 10 units with dinner.  May add 4 units with each meal if CBG is above 200. -Take your CBG first thing when you wake up in the morning, before lunch, before dinner and before bedtime.  Can add additional CBG checks if you feel like your sugar is low in the middle of the night. -Please write down your CBGs at each check with the date and bring it in at time of your follow-up in 4 weeks.      Lelon Frohlich, MD [facility]

## 2018-10-27 NOTE — Patient Instructions (Signed)
BEFORE YOU LEAVE: -Labs today  We have ordered labs or studies at this visit. It can take up to 1-2 weeks for results and processing. IF results require follow up or explanation, we will call you with instructions. Clinically stable results will be released to your [facility] If you have not heard from [place] or cannot find your results in [facility] in 2 weeks please contact our office at [phone].  If you are not yet signed up for [facility], please consider signing up.    -Follow up: In 4 weeks for diabetes management.  Insulin administration instructions -Take Toujeo 40 units at bedtime no matter your nighttime CBG. -Take Novolin 10 units with lunch and 10 units with dinner.  May add 4 units with each meal if CBG is above 200. -Take your CBG first thing when you wake up in the morning, before lunch, before dinner and before bedtime.  Can add additional CBG checks if you feel like your sugar is low in the middle of the night. -Please write down your CBGs at each check with the date and bring it in at time of your follow-up in 4 weeks.

## 2018-10-28 ENCOUNTER — Other Ambulatory Visit: Payer: Self-pay | Admitting: *Deleted

## 2018-10-28 LAB — HEMOGLOBIN A1C: Hgb A1c MFr Bld: 8.6 % — ABNORMAL HIGH (ref 4.6–6.5)

## 2018-10-28 LAB — MICROALBUMIN / CREATININE URINE RATIO
Creatinine,U: 42.9 mg/dL
MICROALB UR: 43.5 mg/dL — AB (ref 0.0–1.9)
Microalb Creat Ratio: 101.4 mg/g — ABNORMAL HIGH (ref 0.0–30.0)

## 2018-10-28 MED ORDER — GLUCOSE BLOOD VI STRP: ORAL_STRIP | 12 refills | Status: DC

## 2018-11-08 MED ORDER — TRUE METRIX GO GLUCOSE METER W/DEVICE KIT: 30.0000 [IU] | PACK | Freq: Every day | 0 refills | Status: DC

## 2018-11-08 NOTE — Telephone Encounter (Signed)
Glucose meter has been sent in.

## 2018-11-08 NOTE — Addendum Note (Signed)
Addended by: Gwenyth Ober R on: 11/08/2018 10:37 AM   Modules accepted: Orders

## 2018-11-08 NOTE — Telephone Encounter (Signed)
[place] w/ [employer] is calling in to follow up on meter /test strips request. They are stating that they have sent over several request.  10/30/18 and also 11/01/18.   They would like to have faxed over as soon as possible.    Pharmacy:  [facility] - [place], [place] [phone] (Phone) [phone] (Fax)

## 2018-11-09 ENCOUNTER — Other Ambulatory Visit: Payer: Self-pay

## 2018-11-09 MED ORDER — "PEN NEEDLES 3/16"" 31G X 5 MM MISC": 0 refills | Status: DC

## 2018-11-09 MED ORDER — INSULIN SYRINGES (DISPOSABLE) U-100 0.3 ML MISC: 0 refills | Status: DC

## 2018-11-16 ENCOUNTER — Inpatient Hospital Stay: Payer: Medicare HMO | Attending: Oncology | Admitting: Nurse Practitioner

## 2018-11-16 ENCOUNTER — Inpatient Hospital Stay: Payer: Medicare HMO

## 2018-11-16 ENCOUNTER — Encounter: Payer: Self-pay | Admitting: Nurse Practitioner

## 2018-11-16 ENCOUNTER — Telehealth: Payer: Self-pay

## 2018-11-16 VITALS — BP 170/59 | HR 77 | Temp 98.8°F | Resp 19 | Ht 62.0 in | Wt 199.2 lb

## 2018-11-16 DIAGNOSIS — I4891 Unspecified atrial fibrillation: Secondary | ICD-10-CM | POA: Insufficient documentation

## 2018-11-16 DIAGNOSIS — Z95828 Presence of other vascular implants and grafts: Secondary | ICD-10-CM

## 2018-11-16 DIAGNOSIS — E119 Type 2 diabetes mellitus without complications: Secondary | ICD-10-CM | POA: Diagnosis not present

## 2018-11-16 DIAGNOSIS — C9501 Acute leukemia of unspecified cell type, in remission: Secondary | ICD-10-CM

## 2018-11-16 DIAGNOSIS — Z452 Encounter for adjustment and management of vascular access device: Secondary | ICD-10-CM | POA: Insufficient documentation

## 2018-11-16 DIAGNOSIS — C9201 Acute myeloblastic leukemia, in remission: Secondary | ICD-10-CM | POA: Insufficient documentation

## 2018-11-16 LAB — CMP (CANCER CENTER ONLY)
ALT: 12 U/L (ref 0–44)
AST: 18 U/L (ref 15–41)
Albumin: 3.1 g/dL — ABNORMAL LOW (ref 3.5–5.0)
Alkaline Phosphatase: 138 U/L — ABNORMAL HIGH (ref 38–126)
Anion gap: 9 (ref 5–15)
BILIRUBIN TOTAL: 0.8 mg/dL (ref 0.3–1.2)
BUN: 12 mg/dL (ref 8–23)
CHLORIDE: 108 mmol/L (ref 98–111)
CO2: 25 mmol/L (ref 22–32)
CREATININE: 1.24 mg/dL — AB (ref 0.44–1.00)
Calcium: 8.1 mg/dL — ABNORMAL LOW (ref 8.9–10.3)
GFR, EST AFRICAN AMERICAN: 49 mL/min — AB (ref >60)
GFR, EST NON AFRICAN AMERICAN: 42 mL/min — AB (ref >60)
Glucose, Bld: 123 mg/dL — ABNORMAL HIGH (ref 70–99)
Potassium: 3.4 mmol/L — ABNORMAL LOW (ref 3.5–5.1)
Sodium: 142 mmol/L (ref 135–145)
TOTAL PROTEIN: 6.2 g/dL — AB (ref 6.5–8.1)

## 2018-11-16 LAB — CBC WITH DIFFERENTIAL (CANCER CENTER ONLY)
Abs Immature Granulocytes: 0.02 10*3/uL (ref 0.00–0.07)
Basophils Absolute: 0 10*3/uL (ref 0.0–0.1)
Basophils Relative: 0 %
EOS ABS: 0.1 10*3/uL (ref 0.0–0.5)
EOS PCT: 1 %
HEMATOCRIT: 34.1 % — AB (ref 36.0–46.0)
Hemoglobin: 10.9 g/dL — ABNORMAL LOW (ref 12.0–15.0)
Immature Granulocytes: 0 %
LYMPHS ABS: 1.5 10*3/uL (ref 0.7–4.0)
Lymphocytes Relative: 19 %
MCH: 31.2 pg (ref 26.0–34.0)
MCHC: 32 g/dL (ref 30.0–36.0)
MCV: 97.7 fL (ref 80.0–100.0)
MONO ABS: 0.6 10*3/uL (ref 0.1–1.0)
Monocytes Relative: 8 %
Neutro Abs: 5.7 10*3/uL (ref 1.7–7.7)
Neutrophils Relative %: 72 %
PLATELETS: 145 10*3/uL — AB (ref 150–400)
RBC: 3.49 MIL/uL — ABNORMAL LOW (ref 3.87–5.11)
RDW: 12.8 % (ref 11.5–15.5)
WBC Count: 8 10*3/uL (ref 4.0–10.5)
nRBC: 0 % (ref 0.0–0.2)

## 2018-11-16 MED ORDER — HEPARIN SOD (PORK) LOCK FLUSH 100 UNIT/ML IV SOLN
500.0000 [IU] | Freq: Once | INTRAVENOUS | Status: AC | PRN
  Administered 2018-11-16: 500 [IU] via INTRAVENOUS
  Filled 2018-11-16: qty 5

## 2018-11-16 MED ORDER — SODIUM CHLORIDE 0.9% FLUSH
10.0000 mL | INTRAVENOUS | Status: DC | PRN
  Administered 2018-11-16: 10 mL via INTRAVENOUS
  Filled 2018-11-16: qty 10

## 2018-11-16 NOTE — Telephone Encounter (Signed)
Printed avs and calender of upcoming appointment. Per 12/4 los 

## 2018-11-16 NOTE — Progress Notes (Signed)
  [place] OFFICE PROGRESS NOTE   Diagnosis:  AML  INTERVAL HISTORY:   [name] returns as scheduled.  She feels well.  No interim illnesses or infections.  No fever, chills or sweats.  She reports a good appetite.  Level of dyspnea varies.  No cough.  She had some loose stools earlier this morning.  Typically bowels move regularly and stools are not loose.  She denies nausea.  Objective:  Vital signs in last 24 hours:  Blood pressure (!) 170/59, pulse 77, temperature 98.8 F (37.1 C), temperature source Oral, resp. rate 19, height 5\' 2"  (1.575 m), weight 199 lb 3.2 oz (90.4 kg), SpO2 98 %.    HEENT: No thrush or ulcers. Lymphatics: No palpable cervical or supraclavicular lymph nodes. Resp: Lungs clear bilaterally. Cardio: Regular rate and rhythm. GI: Abdomen is soft.  No hepatosplenomegaly. Vascular: Trace lower leg edema bilaterally.  Port-A-Cath without erythema.  Lab Results:  Lab Results  Component Value Date   WBC 8.0 11/16/2018   HGB 10.9 (L) 11/16/2018   HCT 34.1 (L) 11/16/2018   MCV 97.7 11/16/2018   PLT 145 (L) 11/16/2018   NEUTROABS 5.7 11/16/2018    Imaging:  No results found.  Medications: I have reviewed the patient's current medications.  Assessment/Plan: 1. 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  Status post cycle 3 high-dose araC consolidation 08/22/2014.  Bone marrow 09/15/2014 showed necrotic marrow with minute focus of myeloid precursors. No evidence of viable acute leukemia. 2.History of pancytopenia secondary to chemotherapy.  3. C. difficile colitis-06/09/2014 treated  with Flagyl  4. Diabetes  5. renal insufficiency  6. history of hypokalemia and hypomagnesemia. 7. history of atrial fibrillation  9. history of coronary artery disease  10. Hospitalization with febrile neutropenia/sepsis secondary to Escherichia coli bacteremia 09/02/2014. 11. Shingles involving the right back October 2015. 12. Meningioma on brain CT 05/24/2014 and brain MRI 05/24/2014 13. Hospitalization09/17/2018 through 08/28/2017 with acute CHF. 14.Admission 11/21/2017 with expressive aphasia, seizure activity noted on EEG, placed on Keppra  Disposition: [name] remains in clinical remission from AML.  The Port-A-Cath was flushed today.  She will return in 6 weeks for the next Port-A-Cath flush.  She will see Dr. Florene Glen and have a Port-A-Cath flush in February 2020.  We will see her in follow-up with labs and a Port-A-Cath flush in early April 2020.  She will contact the office in the interim with any problems.  Plan reviewed with Dr. [name].    Ned Card ANP/GNP-BC   11/16/2018  10:01 AM

## 2018-11-25 ENCOUNTER — Encounter: Payer: Self-pay | Admitting: Internal Medicine

## 2018-11-25 ENCOUNTER — Ambulatory Visit (INDEPENDENT_AMBULATORY_CARE_PROVIDER_SITE_OTHER): Payer: Medicare HMO | Admitting: Internal Medicine

## 2018-11-25 VITALS — BP 110/68 | HR 76 | Temp 99.0°F | Wt 199.1 lb

## 2018-11-25 DIAGNOSIS — I10 Essential (primary) hypertension: Secondary | ICD-10-CM | POA: Diagnosis not present

## 2018-11-25 DIAGNOSIS — Z794 Long term (current) use of insulin: Secondary | ICD-10-CM

## 2018-11-25 DIAGNOSIS — E538 Deficiency of other specified B group vitamins: Secondary | ICD-10-CM | POA: Diagnosis not present

## 2018-11-25 DIAGNOSIS — E119 Type 2 diabetes mellitus without complications: Secondary | ICD-10-CM | POA: Diagnosis not present

## 2018-11-25 DIAGNOSIS — IMO0001 Reserved for inherently not codable concepts without codable children: Secondary | ICD-10-CM

## 2018-11-25 MED ORDER — CYANOCOBALAMIN 1000 MCG/ML IJ SOLN
1000.0000 ug | Freq: Once | INTRAMUSCULAR | Status: AC
  Administered 2018-11-25: 1000 ug via INTRAMUSCULAR

## 2018-11-25 NOTE — Addendum Note (Signed)
Addended by: Westley Hummer B on: 11/25/2018 04:31 PM   Modules accepted: Orders

## 2018-11-25 NOTE — Progress Notes (Signed)
Established Patient Office Visit     CC/Reason for Visit: Follow-up for diabetes management  HPI: [name] is a 77 y.o. female who is coming in today for the above mentioned reasons. Past Medical History is significant for: Labile insulin-dependent diabetes, hypertension, stage IV chronic kidney disease, hyperlipidemia, coronary artery disease among other issues.  She is coming in today for 4-week follow-up for diabetic management.  She was previously having frequent early morning awakenings with hypoglycemia.  We made some changes to her insulin dosing and are having her return today for follow-up.  She is currently taking Toujeo 40 units at bedtime.  She only eats 2 meals which are lunch and dinner so she has been doing 10 units of NovoLog with lunch at 12 PM and with dinner at 5 PM, does not take NovoLog in the morning.  She brings her CBG log today.  Overall CBGs have improved, she is no longer having early morning hypoglycemia but she is now having hypoglycemia at around 9 PM with frequent CBGs in the 50-60 range.  Her fasting blood sugars are averaging around 130 with a range between 90 and 160 .  She has no acute complaints today.   Past Medical/Surgical History: Past Medical History:  Diagnosis Date  . acute myeloblastic leukemia (AML) dx'd 06/2014  . Acute pancreatitis   . Arthritis    "back" (09/17/2017)  . Atrial flutter ([place])   . Cardiomyopathy ([place])   . Chronic kidney disease, stage II (mild)   . Chronic lower back pain   . Colon polyps 04/29/2010   TUBULAR ADENOMA AND A SERRATED ADENOMA  . COPD (chronic obstructive pulmonary disease) ([place])   . CORONARY ARTERY DISEASE 12/24/2007  . DIABETES MELLITUS, TYPE II 07/13/2007  . History of blood transfusion 2015   "related to leukemia"  . History of kidney stones 12/24/2007  . HYPERLIPIDEMIA 12/24/2007  . HYPERTENSION 07/13/2007  . NSTEMI (non-ST elevated myocardial infarction) ([place]) 09/17/2017  . Unspecified disease of  pancreas   . Uterine cancer [facility])     Past Surgical History:  Procedure Laterality Date  . ABDOMINAL HYSTERECTOMY     "still have my ovaries"  . CARDIAC CATHETERIZATION  2002  . CARDIAC CATHETERIZATION  09/17/2017  . CATARACT EXTRACTION W/ INTRAOCULAR LENS  IMPLANT, BILATERAL Bilateral   . COLONOSCOPY W/ BIOPSIES AND POLYPECTOMY  2011  . CORONARY STENT INTERVENTION N/A 09/21/2017   Procedure: CORONARY STENT INTERVENTION;  Surgeon: Troy Sine, MD;  Location: [place] [facility];  Service: Cardiovascular;  Laterality: N/A;  . DILATION AND CURETTAGE OF UTERUS    . EXCISIONAL HEMORRHOIDECTOMY  X 2  . GANGLION CYST EXCISION Left X 3  . INTRAVASCULAR PRESSURE WIRE/FFR STUDY N/A 09/17/2017   Procedure: INTRAVASCULAR PRESSURE WIRE/FFR STUDY;  Surgeon: Nelva Bush, MD;  Location: [place] [facility];  Service: Cardiovascular;  Laterality: N/A;  . NASAL SINUS SURGERY    . PORTA CATH INSERTION Right 2013  . RIGHT/LEFT HEART CATH AND CORONARY ANGIOGRAPHY N/A 09/17/2017   Procedure: RIGHT/LEFT HEART CATH AND CORONARY ANGIOGRAPHY;  Surgeon: Nelva Bush, MD;  Location: [place] [facility];  Service: Cardiovascular;  Laterality: N/A;  . TUBAL LIGATION      Social History:  reports that she quit smoking about 17 years ago. Her smoking use included cigarettes. She has a 20.00 pack-year smoking history. She has never used smokeless tobacco. She reports that she does not drink alcohol or use drugs.  Allergies: No Known Allergies  Family History:  Family History  Problem Relation Age of Onset  . COPD Father   . Throat cancer Father   . Uterine cancer Sister        uterine ca and breast ca  . Breast cancer Sister   . Throat cancer Brother   . Suicidality Brother   . Congestive Heart Failure Mother   . Heart attack Maternal Grandmother   . Colon cancer Neg Hx      Current Outpatient Medications:  .  amLODipine (NORVASC) 5 MG tablet, Take 1 tablet (5 mg total) by mouth daily.,  Disp: 90 tablet, Rfl: 1 .  aspirin EC 81 MG tablet, Take 1 tablet (81 mg total) by mouth daily., Disp: , Rfl:  .  atorvastatin (LIPITOR) 40 MG tablet, Take 1 tablet (40 mg total) by mouth daily., Disp: 90 tablet, Rfl: 1 .  Blood Glucose Monitoring Suppl (TRUE METRIX GO GLUCOSE METER) w/Device KIT, 30 Units by Does not apply route daily., Disp: 1 kit, Rfl: 0 .  clopidogrel (PLAVIX) 75 MG tablet, Take 1 tablet (75 mg total) by mouth every evening., Disp: 90 tablet, Rfl: 1 .  fluticasone furoate-vilanterol (BREO ELLIPTA) 200-25 MCG/INH AEPB, Inhale 1 puff into the lungs daily., Disp: 120 each, Rfl: 5 .  furosemide (LASIX) 40 MG tablet, Take 1 tablet (40 mg total) by mouth daily as needed for edema., Disp: 90 tablet, Rfl: 1 .  glucose blood (TRUE METRIX BLOOD GLUCOSE TEST) test strip, Use to test glucose 3 times daily. Dx e11.9  Z79.4 - [employer], Disp: 300 each, Rfl: 12 .  HYDROcodone-acetaminophen (NORCO/VICODIN) 5-325 MG tablet, Take 1 tablet by mouth every 6 (six) hours as needed for moderate pain., Disp: 120 tablet, Rfl: 0 .  HYDROcodone-acetaminophen (NORCO/VICODIN) 5-325 MG tablet, Take 1 tablet by mouth every 6 (six) hours as needed for moderate pain., Disp: 120 tablet, Rfl: 0 .  HYDROcodone-acetaminophen (NORCO/VICODIN) 5-325 MG tablet, Take 1 tablet by mouth every 6 (six) hours as needed for moderate pain., Disp: 120 tablet, Rfl: 0 .  Insulin Glargine (TOUJEO SOLOSTAR) 300 UNIT/ML SOPN, Inject 40 Units into the skin at bedtime., Disp: 1 pen, Rfl: 6 .  Insulin Glargine, 2 Unit Dial, (TOUJEO MAX SOLOSTAR) 300 UNIT/ML SOPN, Inject 40 Units into the skin at bedtime., Disp: 15 mL, Rfl: 3 .  Insulin Pen Needle (PEN NEEDLES 3/16") 31G X 5 MM MISC, Use with Novolin R, Disp: 100 each, Rfl: 0 .  insulin regular (NOVOLIN R) 100 units/mL injection, INJECT 10 UNITS SUBCUTANEOUSLY 3 TIMES DAILY. INJECT 4 EXTRA UNITS IF CBG OVER 200. INJECT 6 EXTRA UNITS IF CBG OVER 300, Disp: 20 mL, Rfl: 1 .  Insulin Syringes,  Disposable, U-100 0.3 ML MISC, Use three times per day, Disp: 100 each, Rfl: 0 .  isosorbide mononitrate (IMDUR) 30 MG 24 hr tablet, Take 1.5 tablets (45 mg total) by mouth daily., Disp: 135 tablet, Rfl: 3 .  LORazepam (ATIVAN) 0.5 MG tablet, TAKE 1 TABLET BY MOUTH EVERY 8 HOURS AS NEEDED FOR ANXIETY(DO NOT EXCEED MORE THAN 2 TABS IN 24 HRS), Disp: 60 tablet, Rfl: 2 .  MAGNESIUM OXIDE 400 PO, Take 400 mg by mouth 2 (two) times daily., Disp: , Rfl:  .  metoprolol succinate (TOPROL XL) 100 MG 24 hr tablet, Take 1 tablet (100 mg total) by mouth daily. Take with or immediately following a meal., Disp: 90 tablet, Rfl: 1 .  nitroGLYCERIN (NITROSTAT) 0.4 MG SL tablet, Place 1 tablet (0.4 mg total) under the  tongue every 5 (five) minutes as needed for chest pain., Disp: 24 tablet, Rfl: 1 .  polyethylene glycol powder (GLYCOLAX/MIRALAX) powder, Take 17 grams by mouth in 8 ounces of water one to two times daily for constipation., Disp: 850 g, Rfl: 5 .  promethazine (PHENERGAN) 12.5 MG tablet, Take 2 tablets (25 mg total) by mouth every 6 (six) hours as needed for nausea., Disp: 12 tablet, Rfl: 0 No current facility-administered medications for this visit.   Facility-Administered Medications Ordered in Other Visits:  .  sodium chloride 0.9 % 1,000 mL with magnesium sulfate 2 g infusion, , Intravenous, Continuous, Ladell Pier, MD, Stopped at 08/04/14 1441  Review of Systems:  Constitutional: Denies fever, chills, diaphoresis, appetite change and fatigue.  HEENT: Denies photophobia, eye pain, redness, hearing loss, ear pain, congestion, sore throat, rhinorrhea, sneezing, mouth sores, trouble swallowing, neck pain, neck stiffness and tinnitus.   Respiratory: Denies SOB, DOE, cough, chest tightness,  and wheezing.   Cardiovascular: Denies chest pain, palpitations and leg swelling.  Gastrointestinal: Denies nausea, vomiting, abdominal pain, diarrhea, constipation, blood in stool and abdominal distention.    Genitourinary: Denies dysuria, urgency, frequency, hematuria, flank pain and difficulty urinating.  Endocrine: Denies: hot or cold intolerance, sweats, changes in hair or nails, polyuria, polydipsia. Musculoskeletal: Denies myalgias, back pain, joint swelling, arthralgias and gait problem.  Skin: Denies pallor, rash and wound.  Neurological: Denies dizziness, seizures, syncope, weakness, light-headedness, numbness and headaches.  Hematological: Denies adenopathy. Easy bruising, personal or family bleeding history  Psychiatric/Behavioral: Denies suicidal ideation, mood changes, confusion, nervousness, sleep disturbance and agitation    Physical Exam: Vitals:   11/25/18 1553  BP: 110/68  Pulse: 76  Temp: 99 F (37.2 C)  TempSrc: Oral  SpO2: 96%  Weight: 199 lb 1.6 oz (90.3 kg)    Body mass index is 36.42 kg/m.   Constitutional: NAD, calm, comfortable Eyes: PERRL, lids and conjunctivae normal ENMT: Mucous membranes are moist.  Respiratory: clear to auscultation bilaterally, no wheezing, no crackles. Normal respiratory effort. No accessory muscle use.  Cardiovascular: Regular rate and rhythm, no murmurs / rubs / gallops. No extremity edema. 2+ pedal pulses. No carotid bruits.  Musculoskeletal: no clubbing / cyanosis. No joint deformity upper and lower extremities. Good ROM, no contractures. Normal muscle tone.  Neurologic: grossly intact and nonfocal Psychiatric: Normal judgment and insight. Alert and oriented x 3. Normal mood.    Impression and Plan:  Insulin dependent diabetes mellitus ([place]) -This remains the main focus of concern today. -Patient advised to stay on Toujeo 40 units at bedtime, she will continue NovoLog 10 units at lunchtime but due to her almost daily 9 PM hypoglycemia, she has been asked to decrease her NovoLog that she takes at 5 PM from 10 units to 5 units. -I suspect this might make her A1c even higher, was 8.6 most recently, however at this point my goal  is to avoid frequent hypoglycemia. -If we continue to have issues with labile diabetes we can consider endocrinology referral.  Essential hypertension -Well-controlled on current management.    Patient Instructions  -It was nice seeing you today!  -Continue Toujeo 40 units at bedtime. Continue Novolog 10 units with lunch, decrease Novolog at dinner time to 5 units. Keep your blood sugar log and bring in to your next visit.  -B12 injection today.  -Schedule follow up in 2 months for continued diabetic management.     Lelon Frohlich, MD [facility]

## 2018-11-25 NOTE — Patient Instructions (Signed)
-  It was nice seeing you today!  -Continue Toujeo 40 units at bedtime. Continue Novolog 10 units with lunch, decrease Novolog at dinner time to 5 units. Keep your blood sugar log and bring in to your next visit.  -B12 injection today.  -Schedule follow up in 2 months for continued diabetic management.

## 2018-12-02 ENCOUNTER — Ambulatory Visit: Payer: Self-pay | Admitting: *Deleted

## 2018-12-02 NOTE — Telephone Encounter (Signed)
Contacted pt regarding symptoms; she states that she had chest and head congestion when she was in the office on 11/25/18 and was told to call back if she still had it for 10 days; she states that now she has green oral and nasal secretions; she has been taking mucinex and robitussin cough syrup with little relief; nurse triage initiated; recommendations made per protocol; the pt normally sees Dr Jerilee Hoh; per Rachel Bo there is no availability in the office today; pt declined being seen at another [facility] office today; she request to be seen 1500 or later 12/03/18; pt offered and accepted appointment with Dr Sarajane Jews, [facility], 12-03-18 at 1500; she verbalized understanding; will route to office for notification of this upcoming appointment. Reason for Disposition . [1] Continuous (nonstop) coughing interferes with work or school AND [2] no improvement using cough treatment per Care Advice  Answer Assessment - Initial Assessment Questions 1. ONSET: "When did the cough begin?"      11/25/18 2. SEVERITY: "How bad is the cough today?"      Severe; takes "forever to get up secretions"  3. RESPIRATORY DISTRESS: "Describe your breathing."      Chest tightenss 4. FEVER: "Do you have a fever?" If so, ask: "What is your temperature, how was it measured, and when did it start?"     no 5. SPUTUM: "Describe the color of your sputum" (clear, white, yellow, green)     Yellowish-green 6. HEMOPTYSIS: "Are you coughing up any blood?" If so ask: "How much?" (flecks, streaks, tablespoons, etc.)     Yes, flecks 7. CARDIAC HISTORY: "Do you have any history of heart disease?" (e.g., heart attack, congestive heart failure)      Congestive heart failure, stent 8. LUNG HISTORY: "Do you have any history of lung disease?"  (e.g., pulmonary embolus, asthma, emphysema)     COPD 9. PE RISK FACTORS: "Do you have a history of blood clots?" (or: recent major surgery, recent prolonged travel, bedridden)     no 10. OTHER SYMPTOMS:  "Do you have any other symptoms?" (e.g., runny nose, wheezing, chest pain)       Chest and head congestion 11. PREGNANCY: "Is there any chance you are pregnant?" "When was your last menstrual period?"       no 12. TRAVEL: "Have you traveled out of the country in the last month?" (e.g., travel history, exposures)       no  Protocols used: [place]

## 2018-12-03 ENCOUNTER — Ambulatory Visit (INDEPENDENT_AMBULATORY_CARE_PROVIDER_SITE_OTHER): Payer: Medicare HMO | Admitting: Family Medicine

## 2018-12-03 VITALS — BP 128/68 | HR 77 | Temp 98.6°F | Wt 197.0 lb

## 2018-12-03 DIAGNOSIS — J019 Acute sinusitis, unspecified: Secondary | ICD-10-CM | POA: Diagnosis not present

## 2018-12-03 MED ORDER — AZITHROMYCIN 250 MG PO TABS: ORAL_TABLET | ORAL | 0 refills | Status: DC

## 2018-12-03 MED ORDER — HYDROCODONE-HOMATROPINE 5-1.5 MG/5ML PO SYRP: 5.0000 mL | ORAL_SOLUTION | ORAL | 0 refills | Status: DC | PRN

## 2018-12-06 ENCOUNTER — Encounter: Payer: Self-pay | Admitting: Family Medicine

## 2018-12-06 NOTE — Progress Notes (Signed)
   Subjective:    Patient ID: [name], female    DOB: [birth year], 77 y.o.   MRN: [id number]  HPI Here for one week of stuffy head, PND, blowing green mucus from the nose, and a dry cough. Using Mucinex. No fever.    Review of Systems  Constitutional: Negative.   HENT: Positive for congestion, postnasal drip, sinus pressure and sinus pain. Negative for sore throat.   Eyes: Negative.   Respiratory: Positive for cough.        Objective:   Physical Exam Constitutional:      Appearance: Normal appearance.  HENT:     Right Ear: Tympanic membrane and ear canal normal.     Left Ear: Tympanic membrane and ear canal normal.     Nose: Nose normal.     Mouth/Throat:     Pharynx: Oropharynx is clear.  Eyes:     Conjunctiva/sclera: Conjunctivae normal.  Neck:     Musculoskeletal: Normal range of motion.  Pulmonary:     Effort: Pulmonary effort is normal. No respiratory distress.     Breath sounds: Normal breath sounds. No stridor. No wheezing, rhonchi or rales.  Lymphadenopathy:     Cervical: No cervical adenopathy.  Neurological:     Mental Status: She is alert.           Assessment & Plan:  Sinusitis, treat with a Zpack.  Alysia Penna, MD

## 2018-12-15 ENCOUNTER — Other Ambulatory Visit: Payer: Self-pay | Admitting: Internal Medicine

## 2018-12-18 ENCOUNTER — Other Ambulatory Visit: Payer: Self-pay | Admitting: Internal Medicine

## 2018-12-20 ENCOUNTER — Ambulatory Visit (INDEPENDENT_AMBULATORY_CARE_PROVIDER_SITE_OTHER): Payer: Medicare HMO | Admitting: *Deleted

## 2018-12-20 DIAGNOSIS — E538 Deficiency of other specified B group vitamins: Secondary | ICD-10-CM

## 2018-12-20 MED ORDER — CYANOCOBALAMIN 1000 MCG/ML IJ SOLN
1000.0000 ug | Freq: Once | INTRAMUSCULAR | Status: AC
  Administered 2018-12-20: 1000 ug via INTRAMUSCULAR

## 2018-12-20 NOTE — Progress Notes (Signed)
Per orders of Dr. Regis Bill in Dr Ella Bodo absence, injection of Vit B12 given by [name], Jaquana Geiger M. Patient tolerated injection well.

## 2018-12-27 ENCOUNTER — Ambulatory Visit: Payer: Medicare HMO

## 2018-12-31 ENCOUNTER — Inpatient Hospital Stay: Payer: Medicare HMO | Attending: Oncology

## 2018-12-31 VITALS — BP 166/66 | HR 74 | Temp 98.9°F

## 2018-12-31 DIAGNOSIS — Z452 Encounter for adjustment and management of vascular access device: Secondary | ICD-10-CM | POA: Diagnosis not present

## 2018-12-31 DIAGNOSIS — C9201 Acute myeloblastic leukemia, in remission: Secondary | ICD-10-CM | POA: Diagnosis not present

## 2018-12-31 DIAGNOSIS — Z95828 Presence of other vascular implants and grafts: Secondary | ICD-10-CM

## 2018-12-31 MED ORDER — HEPARIN SOD (PORK) LOCK FLUSH 100 UNIT/ML IV SOLN
500.0000 [IU] | Freq: Once | INTRAVENOUS | Status: AC
  Administered 2018-12-31: 500 [IU] via INTRAVENOUS
  Filled 2018-12-31: qty 5

## 2018-12-31 MED ORDER — SODIUM CHLORIDE 0.9% FLUSH
10.0000 mL | Freq: Once | INTRAVENOUS | Status: AC
  Administered 2018-12-31: 10 mL via INTRAVENOUS
  Filled 2018-12-31: qty 10

## 2018-12-31 MED ORDER — HEPARIN SOD (PORK) LOCK FLUSH 100 UNIT/ML IV SOLN
500.0000 [IU] | Freq: Once | INTRAVENOUS | Status: AC | PRN
  Administered 2018-12-31: 500 [IU] via INTRAVENOUS
  Filled 2018-12-31: qty 5

## 2018-12-31 NOTE — Patient Instructions (Signed)
Implanted Port Home Guide An implanted port is a type of central line that is placed under the skin. Central lines are used to provide IV access when treatment or nutrition needs to be given through a person's veins. Implanted ports are used for long-term IV access. An implanted port may be placed because:  You need IV medicine that would be irritating to the small veins in your hands or arms.  You need long-term IV medicines, such as antibiotics.  You need IV nutrition for a long period.  You need frequent blood draws for lab tests.  You need dialysis.  Implanted ports are usually placed in the chest area, but they can also be placed in the upper arm, the abdomen, or the leg. An implanted port has two main parts:  Reservoir. The reservoir is round and will appear as a small, raised area under your skin. The reservoir is the part where a needle is inserted to give medicines or draw blood.  Catheter. The catheter is a thin, flexible tube that extends from the reservoir. The catheter is placed into a large vein. Medicine that is inserted into the reservoir goes into the catheter and then into the vein.  How will I care for my incision site? Do not get the incision site wet. Bathe or shower as directed by your health care provider. How is my port accessed? Special steps must be taken to access the port:  Before the port is accessed, a numbing cream can be placed on the skin. This helps numb the skin over the port site.  Your health care provider uses a sterile technique to access the port. ? Your health care provider must put on a mask and sterile gloves. ? The skin over your port is cleaned carefully with an antiseptic and allowed to dry. ? The port is gently pinched between sterile gloves, and a needle is inserted into the port.  Only "non-coring" port needles should be used to access the port. Once the port is accessed, a blood return should be checked. This helps ensure that the port  is in the vein and is not clogged.  If your port needs to remain accessed for a constant infusion, a clear (transparent) bandage will be placed over the needle site. The bandage and needle will need to be changed every week, or as directed by your health care provider.  Keep the bandage covering the needle clean and dry. Do not get it wet. Follow your health care provider's instructions on how to take a shower or bath while the port is accessed.  If your port does not need to stay accessed, no bandage is needed over the port.  What is flushing? Flushing helps keep the port from getting clogged. Follow your health care provider's instructions on how and when to flush the port. Ports are usually flushed with saline solution or a medicine called heparin. The need for flushing will depend on how the port is used.  If the port is used for intermittent medicines or blood draws, the port will need to be flushed: ? After medicines have been given. ? After blood has been drawn. ? As part of routine maintenance.  If a constant infusion is running, the port may not need to be flushed.  How long will my port stay implanted? The port can stay in for as long as your health care provider thinks it is needed. When it is time for the port to come out, surgery will be   done to remove it. The procedure is similar to the one performed when the port was put in. When should I seek immediate medical care? When you have an implanted port, you should seek immediate medical care if:  You notice a bad smell coming from the incision site.  You have swelling, redness, or drainage at the incision site.  You have more swelling or pain at the port site or the surrounding area.  You have a fever that is not controlled with medicine.  This information is not intended to replace advice given to you by your health care provider. Make sure you discuss any questions you have with your health care provider. Document  Released: 11/24/2005 Document Revised: 05/01/2016 Document Reviewed: 08/01/2013 Elsevier Interactive Patient Education  2017 [employer]  

## 2019-01-12 ENCOUNTER — Other Ambulatory Visit: Payer: Self-pay | Admitting: Internal Medicine

## 2019-01-12 DIAGNOSIS — C9201 Acute myeloblastic leukemia, in remission: Secondary | ICD-10-CM

## 2019-01-12 NOTE — Telephone Encounter (Signed)
Copied from [id number] [id number]. Topic: Quick Communication - Rx Refill/Question >> Jan 12, 2019  3:48 PM Wynetta Emery, [place] C wrote: Medication: HYDROcodone-acetaminophen (NORCO/VICODIN) 5-325 MG tablet   Has the patient contacted their pharmacy? No  (Agent: If no, request that the patient contact the pharmacy for the refill.) (Agent: If yes, when and what did the pharmacy advise?)  Preferred Pharmacy (with phone number or street name): [facility], [place] [phone] (Phone) [phone] (Fax)    Agent: Please be advised that RX refills may take up to 3 business days. We ask that you follow-up with your pharmacy.

## 2019-01-13 ENCOUNTER — Telehealth: Payer: Self-pay | Admitting: *Deleted

## 2019-01-13 NOTE — Telephone Encounter (Signed)
Can she see me before this prescription expires? I like to see the folks on pain contracts every 3 months.

## 2019-01-13 NOTE — Telephone Encounter (Signed)
Copied from [place] [id number]. Topic: General - Other >> Jan 12, 2019  3:53 PM Wynetta Emery, [place] C wrote: Reason for CRM: pt would like to know if the office has samples of Insulin Glargine (TOUJEO SOLOSTAR) 300 UNIT/ML SOPN ?   Please advise

## 2019-01-20 ENCOUNTER — Ambulatory Visit: Payer: Medicare HMO

## 2019-01-25 ENCOUNTER — Ambulatory Visit (INDEPENDENT_AMBULATORY_CARE_PROVIDER_SITE_OTHER): Payer: Medicare HMO | Admitting: Internal Medicine

## 2019-01-25 ENCOUNTER — Encounter: Payer: Self-pay | Admitting: Internal Medicine

## 2019-01-25 VITALS — BP 150/50 | HR 74 | Temp 98.3°F | Wt 192.2 lb

## 2019-01-25 DIAGNOSIS — F419 Anxiety disorder, unspecified: Secondary | ICD-10-CM | POA: Diagnosis not present

## 2019-01-25 DIAGNOSIS — Z794 Long term (current) use of insulin: Secondary | ICD-10-CM | POA: Diagnosis not present

## 2019-01-25 DIAGNOSIS — E538 Deficiency of other specified B group vitamins: Secondary | ICD-10-CM

## 2019-01-25 DIAGNOSIS — I10 Essential (primary) hypertension: Secondary | ICD-10-CM

## 2019-01-25 DIAGNOSIS — G8929 Other chronic pain: Secondary | ICD-10-CM

## 2019-01-25 DIAGNOSIS — M549 Dorsalgia, unspecified: Secondary | ICD-10-CM | POA: Diagnosis not present

## 2019-01-25 DIAGNOSIS — E119 Type 2 diabetes mellitus without complications: Secondary | ICD-10-CM

## 2019-01-25 DIAGNOSIS — C9201 Acute myeloblastic leukemia, in remission: Secondary | ICD-10-CM | POA: Diagnosis not present

## 2019-01-25 DIAGNOSIS — IMO0001 Reserved for inherently not codable concepts without codable children: Secondary | ICD-10-CM

## 2019-01-25 MED ORDER — CYANOCOBALAMIN 1000 MCG/ML IJ SOLN
1000.0000 ug | Freq: Once | INTRAMUSCULAR | Status: AC
  Administered 2019-01-25: 1000 ug via INTRAMUSCULAR

## 2019-01-25 MED ORDER — INSULIN GLARGINE (2 UNIT DIAL) 300 UNIT/ML ~~LOC~~ SOPN: 40.0000 [IU] | PEN_INJECTOR | Freq: Every day | SUBCUTANEOUS | 0 refills | Status: DC

## 2019-01-25 MED ORDER — HYDROCODONE-ACETAMINOPHEN 5-325 MG PO TABS: 1.0000 | ORAL_TABLET | Freq: Four times a day (QID) | ORAL | 0 refills | Status: DC | PRN

## 2019-01-25 MED ORDER — LORAZEPAM 0.5 MG PO TABS: ORAL_TABLET | ORAL | 2 refills | Status: DC

## 2019-01-25 NOTE — Patient Instructions (Addendum)
Great seeing you again today.  Instructions: -B12 injection today. -Referral to endocrinology is in process. -Schedule follow up in 3 months.

## 2019-01-25 NOTE — Progress Notes (Signed)
Established Patient Office Visit     CC/Reason for Visit: diabetic follow up, medication refills, B12 injection  HPI: [name] is a 78 y.o. female who is coming in today for the above mentioned reasons. Past Medical History is significant for diabetes, hypertension and vitamin B12 def, CKD, anxiety, chronic back pain and copd.  For her DM she is on Toujeo 40 units, Novolog 10 with lunch and 5 with dinner. She was also on novolog 10 with dinner but I had decreased at last visit due to frequent 9 pm hypoglycemia. Patient says that sometimes when she wakes up in the middle of the night to urinate, she doesn't feel well and checks her blood sugars, which tend to be on the low side.  See blood sugar diary below.  Will refer to endo for labile diabetes.  Patient is requesting a refill on her pain medication and her ativan for anxiety.  Patient says her chronic back pain and anxiety is stable on her medications.  Patient's blood pressure is elevated today at 150/50 and we will cont to monitor closely.  She will receive her B12 injection today.    [place] printed, initialed  and scanned into the EMR.   Indication for chronic opioid: chronic back, neck and hip pain Medication and dose: vicodin 5-'325mg'$  # pills per month: 120 Last UDS date:  Opioid Treatment Agreement signed: yes Opioid Treatment Agreement last reviewed with patient: yes 10/27/18 NCCSRS reviewed this encounter (include red flags):  yes, no red flags, overdose risk score 240    Past Medical/Surgical History: Past Medical History:  Diagnosis Date  . acute myeloblastic leukemia (AML) dx'd 06/2014  . Acute pancreatitis   . Arthritis    "back" (09/17/2017)  . Atrial flutter ([place])   . Cardiomyopathy ([place])   . Chronic kidney disease, stage II (mild)   . Chronic lower back pain   . Colon polyps 04/29/2010   TUBULAR ADENOMA AND A SERRATED ADENOMA  . COPD (chronic obstructive pulmonary disease) ([place])   . CORONARY  ARTERY DISEASE 12/24/2007  . DIABETES MELLITUS, TYPE II 07/13/2007  . History of blood transfusion 2015   "related to leukemia"  . History of kidney stones 12/24/2007  . HYPERLIPIDEMIA 12/24/2007  . HYPERTENSION 07/13/2007  . NSTEMI (non-ST elevated myocardial infarction) ([place]) 09/17/2017  . Unspecified disease of pancreas   . Uterine cancer [facility])     Past Surgical History:  Procedure Laterality Date  . ABDOMINAL HYSTERECTOMY     "still have my ovaries"  . CARDIAC CATHETERIZATION  2002  . CARDIAC CATHETERIZATION  09/17/2017  . CATARACT EXTRACTION W/ INTRAOCULAR LENS  IMPLANT, BILATERAL Bilateral   . COLONOSCOPY W/ BIOPSIES AND POLYPECTOMY  2011  . CORONARY STENT INTERVENTION N/A 09/21/2017   Procedure: CORONARY STENT INTERVENTION;  Surgeon: Troy Sine, MD;  Location: [facility];  Service: Cardiovascular;  Laterality: N/A;  . DILATION AND CURETTAGE OF UTERUS    . EXCISIONAL HEMORRHOIDECTOMY  X 2  . GANGLION CYST EXCISION Left X 3  . INTRAVASCULAR PRESSURE WIRE/FFR STUDY N/A 09/17/2017   Procedure: INTRAVASCULAR PRESSURE WIRE/FFR STUDY;  Surgeon: Nelva Bush, MD;  Location: [facility];  Service: Cardiovascular;  Laterality: N/A;  . NASAL SINUS SURGERY    . PORTA CATH INSERTION Right 2013  . RIGHT/LEFT HEART CATH AND CORONARY ANGIOGRAPHY N/A 09/17/2017   Procedure: RIGHT/LEFT HEART CATH AND CORONARY ANGIOGRAPHY;  Surgeon: Nelva Bush, MD;  Location: [facility];  Service: Cardiovascular;  Laterality: N/A;  . TUBAL LIGATION      Social History:  reports that she quit smoking about 18 years ago. Her smoking use included cigarettes. She has a 20.00 pack-year smoking history. She has never used smokeless tobacco. She reports that she does not drink alcohol or use drugs.  Allergies: No Known Allergies  Family History:  Family History  Problem Relation Age of Onset  . COPD Father   . Throat cancer Father   . Uterine cancer Sister        uterine ca  and breast ca  . Breast cancer Sister   . Throat cancer Brother   . Suicidality Brother   . Congestive Heart Failure Mother   . Heart attack Maternal Grandmother   . Colon cancer Neg Hx      Current Outpatient Medications:  .  amLODipine (NORVASC) 5 MG tablet, Take 1 tablet (5 mg total) by mouth daily., Disp: 90 tablet, Rfl: 1 .  aspirin EC 81 MG tablet, Take 1 tablet (81 mg total) by mouth daily., Disp: , Rfl:  .  atorvastatin (LIPITOR) 40 MG tablet, Take 1 tablet (40 mg total) by mouth daily., Disp: 90 tablet, Rfl: 1 .  azithromycin (ZITHROMAX Z-PAK) 250 MG tablet, As directed, Disp: 6 each, Rfl: 0 .  BD VEO INSULIN SYRINGE U/F 31G X 15/64" 0.3 ML MISC, USE THREE TIMES DAILY, Disp: 100 each, Rfl: 1 .  Blood Glucose Monitoring Suppl (TRUE METRIX GO GLUCOSE METER) w/Device KIT, 30 Units by Does not apply route daily., Disp: 1 kit, Rfl: 0 .  clopidogrel (PLAVIX) 75 MG tablet, Take 1 tablet (75 mg total) by mouth every evening., Disp: 90 tablet, Rfl: 1 .  fluticasone furoate-vilanterol (BREO ELLIPTA) 200-25 MCG/INH AEPB, Inhale 1 puff into the lungs daily., Disp: 120 each, Rfl: 5 .  furosemide (LASIX) 40 MG tablet, Take 1 tablet (40 mg total) by mouth daily as needed for edema., Disp: 90 tablet, Rfl: 1 .  glucose blood (TRUE METRIX BLOOD GLUCOSE TEST) test strip, Use to test glucose 3 times daily. Dx e11.9  Z79.4 - [employer], Disp: 300 each, Rfl: 12 .  HYDROcodone-acetaminophen (NORCO/VICODIN) 5-325 MG tablet, Take 1 tablet by mouth every 6 (six) hours as needed for moderate pain., Disp: 120 tablet, Rfl: 0 .  HYDROcodone-acetaminophen (NORCO/VICODIN) 5-325 MG tablet, Take 1 tablet by mouth every 6 (six) hours as needed for moderate pain., Disp: 120 tablet, Rfl: 0 .  HYDROcodone-acetaminophen (NORCO/VICODIN) 5-325 MG tablet, Take 1 tablet by mouth every 6 (six) hours as needed for moderate pain., Disp: 120 tablet, Rfl: 0 .  HYDROcodone-homatropine (HYDROMET) 5-1.5 MG/5ML syrup, Take 5 mLs by mouth  every 4 (four) hours as needed., Disp: 240 mL, Rfl: 0 .  Insulin Glargine (TOUJEO SOLOSTAR) 300 UNIT/ML SOPN, Inject 40 Units into the skin at bedtime., Disp: 1 pen, Rfl: 6 .  Insulin Glargine, 2 Unit Dial, (TOUJEO MAX SOLOSTAR) 300 UNIT/ML SOPN, Inject 40 Units into the skin at bedtime., Disp: 15 mL, Rfl: 0 .  Insulin Pen Needle (PEN NEEDLES 3/16") 31G X 5 MM MISC, Use with Novolin R, Disp: 100 each, Rfl: 0 .  insulin regular (NOVOLIN R) 100 units/mL injection, INJECT 10 UNITS SUBCUTANEOUSLY 3 TIMES DAILY. INJECT 4 EXTRA UNITS IF CBG OVER 200. INJECT 6 EXTRA UNITS IF CBG OVER 300, Disp: 20 mL, Rfl: 1 .  isosorbide mononitrate (IMDUR) 30 MG 24 hr tablet, Take 1.5 tablets (45 mg total) by mouth daily., Disp: 135 tablet,  Rfl: 3 .  LORazepam (ATIVAN) 0.5 MG tablet, TAKE 1 TABLET BY MOUTH EVERY 8 HOURS AS NEEDED FOR ANXIETY(DO NOT EXCEED MORE THAN 2 TABS IN 24 HRS), Disp: 60 tablet, Rfl: 2 .  MAGNESIUM OXIDE 400 PO, Take 400 mg by mouth 2 (two) times daily., Disp: , Rfl:  .  metoprolol succinate (TOPROL XL) 100 MG 24 hr tablet, Take 1 tablet (100 mg total) by mouth daily. Take with or immediately following a meal., Disp: 90 tablet, Rfl: 1 .  nitroGLYCERIN (NITROSTAT) 0.4 MG SL tablet, Place 1 tablet (0.4 mg total) under the tongue every 5 (five) minutes as needed for chest pain., Disp: 24 tablet, Rfl: 1 .  polyethylene glycol powder (GLYCOLAX/MIRALAX) powder, Take 17 grams by mouth in 8 ounces of water one to two times daily for constipation., Disp: 850 g, Rfl: 5 .  promethazine (PHENERGAN) 12.5 MG tablet, Take 2 tablets (25 mg total) by mouth every 6 (six) hours as needed for nausea., Disp: 12 tablet, Rfl: 0 No current facility-administered medications for this visit.   Facility-Administered Medications Ordered in Other Visits:  .  sodium chloride 0.9 % 1,000 mL with magnesium sulfate 2 g infusion, , Intravenous, Continuous, Ladell Pier, MD, Stopped at 08/04/14 1441  Review of Systems:    Constitutional: Denies fever, chills, diaphoresis, appetite change and fatigue.  HEENT: Denies photophobia, eye pain, redness, hearing loss, ear pain, congestion, sore throat, rhinorrhea, sneezing, mouth sores, trouble swallowing, neck pain, neck stiffness and tinnitus.   Respiratory: Denies SOB, DOE, cough, chest tightness,  and wheezing.   Cardiovascular: Denies chest pain, palpitations and leg swelling.  Gastrointestinal: Denies nausea, vomiting, abdominal pain, diarrhea, constipation, blood in stool and abdominal distention.  Genitourinary: Denies dysuria, urgency, frequency, hematuria, flank pain and difficulty urinating.  Endocrine: Denies: cold intolerance, sweats, changes in hair or nails, polyuria, polydipsia. C/o waking up in middle of the night feeling hot and "feeling bad" Musculoskeletal: Denies myalgias, back pain, joint swelling, arthralgias and gait problem.  Skin: Denies pallor, rash and wound.  Neurological: Denies dizziness, seizures, syncope, weakness, light-headedness, numbness and headaches.  Hematological: Denies adenopathy. Easy bruising, personal or family bleeding history  Psychiatric/Behavioral: Denies suicidal ideation, mood changes, confusion, nervousness, sleep disturbance and agitation      Physical Exam: Vitals:   01/25/19 1528  BP: (!) 150/50  Pulse: 74  Temp: 98.3 F (36.8 C)  TempSrc: Oral  SpO2: 96%  Weight: 192 lb 3.2 oz (87.2 kg)    Body mass index is 35.15 kg/m.   Constitutional: NAD, calm, comfortable Eyes: PERRL, lids and conjunctivae normal Respiratory: clear to auscultation bilaterally, no wheezing, no crackles. Normal respiratory effort. No accessory muscle use.  Cardiovascular: Regular rate and rhythm, no murmurs / rubs / gallops. No extremity edema.  Psychiatric: Normal judgment and insight. Alert and oriented x 3. Normal mood.    Impression and Plan:  Insulin dependent diabetes mellitus (HCC) -   -Continue current regimen for  now. -Labile DM with A1C not at goal despite frequent hypoglycemia, will refer to endocrinology.  Vitamin B12 deficiency -  -cyanocobalamin ((VITAMIN B-12)) injection 1,000 mcg today  Essential hypertension -not at goal. -Continue to monitor, ambulatory BP monitoring has been recommended.  Chronic bilateral back pain, unspecified back location -refilled vicodin, see above -use heating pad as needed  Anxiety -  -requesting lorazapam refill. Has not had one since September 2019.     Patient Instructions  Doristine Devoid seeing you again today.  Instructions: -B12 injection today. -  Referral to endocrinology is in process. -Schedule follow up in 3 months.     Enzo Bi, RN DNP Student [facility]

## 2019-01-26 ENCOUNTER — Ambulatory Visit: Payer: Self-pay | Admitting: Internal Medicine

## 2019-01-28 DIAGNOSIS — R0609 Other forms of dyspnea: Secondary | ICD-10-CM | POA: Diagnosis not present

## 2019-01-28 DIAGNOSIS — N189 Chronic kidney disease, unspecified: Secondary | ICD-10-CM | POA: Diagnosis not present

## 2019-01-28 DIAGNOSIS — I129 Hypertensive chronic kidney disease with stage 1 through stage 4 chronic kidney disease, or unspecified chronic kidney disease: Secondary | ICD-10-CM | POA: Diagnosis not present

## 2019-01-28 DIAGNOSIS — E119 Type 2 diabetes mellitus without complications: Secondary | ICD-10-CM | POA: Diagnosis not present

## 2019-01-28 DIAGNOSIS — I480 Paroxysmal atrial fibrillation: Secondary | ICD-10-CM | POA: Diagnosis not present

## 2019-01-28 DIAGNOSIS — E876 Hypokalemia: Secondary | ICD-10-CM | POA: Diagnosis not present

## 2019-01-28 DIAGNOSIS — C9201 Acute myeloblastic leukemia, in remission: Secondary | ICD-10-CM | POA: Diagnosis not present

## 2019-01-28 DIAGNOSIS — Z95828 Presence of other vascular implants and grafts: Secondary | ICD-10-CM | POA: Diagnosis not present

## 2019-01-28 DIAGNOSIS — E538 Deficiency of other specified B group vitamins: Secondary | ICD-10-CM | POA: Diagnosis not present

## 2019-01-28 DIAGNOSIS — K5900 Constipation, unspecified: Secondary | ICD-10-CM | POA: Diagnosis not present

## 2019-01-28 DIAGNOSIS — N179 Acute kidney failure, unspecified: Secondary | ICD-10-CM | POA: Diagnosis not present

## 2019-01-28 DIAGNOSIS — I10 Essential (primary) hypertension: Secondary | ICD-10-CM | POA: Diagnosis not present

## 2019-01-28 DIAGNOSIS — Z7901 Long term (current) use of anticoagulants: Secondary | ICD-10-CM | POA: Diagnosis not present

## 2019-02-03 ENCOUNTER — Other Ambulatory Visit: Payer: Self-pay | Admitting: Internal Medicine

## 2019-02-03 DIAGNOSIS — E785 Hyperlipidemia, unspecified: Secondary | ICD-10-CM

## 2019-02-08 ENCOUNTER — Encounter: Payer: Self-pay | Admitting: Internal Medicine

## 2019-02-22 ENCOUNTER — Telehealth: Payer: Self-pay | Admitting: *Deleted

## 2019-02-22 NOTE — Telephone Encounter (Signed)
Copied from [place] [id number]. Topic: General - Other >> Feb 21, 2019 12:35 PM Leward Quan A wrote: Reason for CRM: Patient called to request samples of  Insulin Glargine (TOUJEO SOLOSTAR) 300 UNIT/ML SOPN asking for a call back if there are any available. Please call patient at Ph#  [phone]

## 2019-02-24 ENCOUNTER — Ambulatory Visit: Payer: Medicare HMO

## 2019-02-24 ENCOUNTER — Ambulatory Visit: Payer: Medicare HMO | Admitting: Internal Medicine

## 2019-02-28 ENCOUNTER — Telehealth: Payer: Self-pay | Admitting: Internal Medicine

## 2019-02-28 ENCOUNTER — Ambulatory Visit (INDEPENDENT_AMBULATORY_CARE_PROVIDER_SITE_OTHER): Payer: Medicare HMO | Admitting: *Deleted

## 2019-02-28 ENCOUNTER — Other Ambulatory Visit: Payer: Self-pay

## 2019-02-28 DIAGNOSIS — E538 Deficiency of other specified B group vitamins: Secondary | ICD-10-CM

## 2019-02-28 MED ORDER — CYANOCOBALAMIN 1000 MCG/ML IJ SOLN
1000.0000 ug | Freq: Once | INTRAMUSCULAR | Status: AC
  Administered 2019-02-28: 1000 ug via INTRAMUSCULAR

## 2019-02-28 NOTE — Telephone Encounter (Signed)
The patient is requesting for her Rx to be sent to the [facility] for her [place], [place] [phone] (Phone) [phone] (Fax)

## 2019-02-28 NOTE — Progress Notes (Signed)
Per orders of Dr. Elease Hashimoto in Dr Ledell Noss absence, injection of Vit B12 given by [name], Mellissa Conley M. Patient tolerated injection well.

## 2019-03-01 ENCOUNTER — Telehealth: Payer: Self-pay

## 2019-03-01 MED ORDER — CYANOCOBALAMIN 1000 MCG/ML IJ SOLN: 1000.0000 ug | Freq: Once | INTRAMUSCULAR | 3 refills | Status: AC

## 2019-03-01 MED ORDER — CYANOCOBALAMIN 1000 MCG/ML IJ SOLN: 1000.0000 ug | Freq: Once | INTRAMUSCULAR | 3 refills | Status: DC

## 2019-03-01 MED ORDER — "SYRINGE/NEEDLE (DISP) 25G X 5/8"" 1 ML MISC": 3 refills | Status: DC

## 2019-03-01 NOTE — Telephone Encounter (Signed)
Refill sent.

## 2019-03-01 NOTE — Telephone Encounter (Signed)
rx sent

## 2019-03-01 NOTE — Addendum Note (Signed)
Addended by: Westley Hummer B on: 03/01/2019 11:11 AM   Modules accepted: Orders

## 2019-03-01 NOTE — Telephone Encounter (Signed)
Copied from [place] [id number]. Topic: General - Other >> Mar 01, 2019  3:49 PM Leward Quan A wrote: Reason for CRM: Patient called to say that since she cant get her B-12 shots there can the Rx be sent to the pharmacy for her to receive and with the Syringes needed. States that she have someone to give her the injection.   Ph# [phone]

## 2019-03-10 ENCOUNTER — Telehealth: Payer: Self-pay

## 2019-03-10 NOTE — Telephone Encounter (Signed)
Copied from [place] [id number]. Topic: General - Other >> Mar 10, 2019  8:14 AM Virl Axe D wrote: Reason for CRM: Pt complained of head and chest congestion and cough but no fever. She is requesting a z-pac. Pt advised that she may have to do a virtual visit. Expressed understanding. Please advise. CB#[phone]

## 2019-03-10 NOTE — Telephone Encounter (Signed)
Web visit

## 2019-03-10 NOTE — Telephone Encounter (Signed)
Patient states she has the same thing she had when she saw Dr Sarajane Jews.  She was given a z-pak and cough syrup and it worked.  I explained Dr Jerilee Hoh may not treat her cough the same.  patient is complaining of sinus pressure, drainage, sore throat, cough.  She denies any dyspnea.  The symptoms started last night.  [facility] on [address]  Please advise.

## 2019-03-10 NOTE — Telephone Encounter (Signed)
Patient does not have access to a computer or Iphone.  Telephone visit was schedueld

## 2019-03-11 ENCOUNTER — Ambulatory Visit (INDEPENDENT_AMBULATORY_CARE_PROVIDER_SITE_OTHER): Payer: Medicare HMO | Admitting: Internal Medicine

## 2019-03-11 ENCOUNTER — Ambulatory Visit: Payer: Self-pay | Admitting: Internal Medicine

## 2019-03-11 ENCOUNTER — Other Ambulatory Visit: Payer: Self-pay

## 2019-03-11 DIAGNOSIS — J209 Acute bronchitis, unspecified: Secondary | ICD-10-CM

## 2019-03-11 DIAGNOSIS — J440 Chronic obstructive pulmonary disease with acute lower respiratory infection: Secondary | ICD-10-CM

## 2019-03-11 MED ORDER — AZITHROMYCIN 250 MG PO TABS: ORAL_TABLET | ORAL | 0 refills | Status: DC

## 2019-03-11 MED ORDER — BENZONATATE 100 MG PO CAPS: 100.0000 mg | ORAL_CAPSULE | Freq: Two times a day (BID) | ORAL | 0 refills | Status: DC | PRN

## 2019-03-11 NOTE — Progress Notes (Signed)
Virtual Visit via Telephone Note  I connected with [name] on 03/11/19 at  8:30 AM EDT by telephone and verified that I am speaking with the correct person using two identifiers.   I discussed the limitations, risks, security and privacy concerns of performing an evaluation and management service by telephone and the availability of in person appointments. I also discussed with the patient that there may be a patient responsible charge related to this service. The patient expressed understanding and agreed to proceed.  We attempted to connect via WebEx video chat first, but due to difficulties on the patient's end we had to transform it into a phone visit.  Location patient: home Location provider: work office Participants present for the call: patient, provider Patient did not have a visit in the prior 7 days to address this/these issue(s).   History of Present Illness:  2 days of sinus drainage and sore throat. Is now producing a thick, yellow sputum. She has COPD. No increased SOB, DOE, CP, wheezing. No fever or chills. No sick contacts or recent travel. She has been self quarantining at home. Only been twice to the grocery store. She know she will get worse if she "doesn't get something fast".   Observations/Objective: Patient sounds cheerful and well on the phone. I do not appreciate any increased work of breathing. Speech and thought processing are grossly intact. Patient reported vitals: none reported   Current Outpatient Medications:  .  amLODipine (NORVASC) 5 MG tablet, Take 1 tablet (5 mg total) by mouth daily., Disp: 90 tablet, Rfl: 1 .  aspirin EC 81 MG tablet, Take 1 tablet (81 mg total) by mouth daily., Disp: , Rfl:  .  atorvastatin (LIPITOR) 40 MG tablet, TAKE 1 TABLET EVERY DAY, Disp: 90 tablet, Rfl: 1 .  azithromycin (ZITHROMAX Z-PAK) 250 MG tablet, As directed, Disp: 6 each, Rfl: 0 .  BD VEO INSULIN SYRINGE U/F 31G X 15/64" 0.3 ML MISC, USE THREE TIMES  DAILY, Disp: 100 each, Rfl: 1 .  benzonatate (TESSALON) 100 MG capsule, Take 1 capsule (100 mg total) by mouth 2 (two) times daily as needed for cough., Disp: 20 capsule, Rfl: 0 .  Blood Glucose Monitoring Suppl (TRUE METRIX GO GLUCOSE METER) w/Device KIT, 30 Units by Does not apply route daily., Disp: 1 kit, Rfl: 0 .  clopidogrel (PLAVIX) 75 MG tablet, Take 1 tablet (75 mg total) by mouth every evening., Disp: 90 tablet, Rfl: 1 .  fluticasone furoate-vilanterol (BREO ELLIPTA) 200-25 MCG/INH AEPB, Inhale 1 puff into the lungs daily., Disp: 120 each, Rfl: 5 .  furosemide (LASIX) 40 MG tablet, Take 1 tablet (40 mg total) by mouth daily as needed for edema., Disp: 90 tablet, Rfl: 1 .  glucose blood (TRUE METRIX BLOOD GLUCOSE TEST) test strip, Use to test glucose 3 times daily. Dx e11.9  Z79.4 - [employer], Disp: 300 each, Rfl: 12 .  HYDROcodone-acetaminophen (NORCO/VICODIN) 5-325 MG tablet, Take 1 tablet by mouth every 6 (six) hours as needed for moderate pain., Disp: 120 tablet, Rfl: 0 .  HYDROcodone-acetaminophen (NORCO/VICODIN) 5-325 MG tablet, Take 1 tablet by mouth every 6 (six) hours as needed for moderate pain., Disp: 120 tablet, Rfl: 0 .  HYDROcodone-acetaminophen (NORCO/VICODIN) 5-325 MG tablet, Take 1 tablet by mouth every 6 (six) hours as needed for moderate pain., Disp: 120 tablet, Rfl: 0 .  HYDROcodone-homatropine (HYDROMET) 5-1.5 MG/5ML syrup, Take 5 mLs by mouth every 4 (four) hours as needed., Disp: 240 mL, Rfl: 0 .  Insulin Glargine (TOUJEO SOLOSTAR) 300 UNIT/ML SOPN, Inject 40 Units into the skin at bedtime., Disp: 1 pen, Rfl: 6 .  Insulin Glargine, 2 Unit Dial, (TOUJEO MAX SOLOSTAR) 300 UNIT/ML SOPN, Inject 40 Units into the skin at bedtime., Disp: 15 mL, Rfl: 0 .  Insulin Pen Needle (PEN NEEDLES 3/16") 31G X 5 MM MISC, Use with Novolin R, Disp: 100 each, Rfl: 0 .  insulin regular (NOVOLIN R) 100 units/mL injection, INJECT 10 UNITS SUBCUTANEOUSLY 3 TIMES DAILY. INJECT 4 EXTRA UNITS IF CBG  OVER 200. INJECT 6 EXTRA UNITS IF CBG OVER 300, Disp: 20 mL, Rfl: 1 .  isosorbide mononitrate (IMDUR) 30 MG 24 hr tablet, Take 1.5 tablets (45 mg total) by mouth daily., Disp: 135 tablet, Rfl: 3 .  LORazepam (ATIVAN) 0.5 MG tablet, TAKE 1 TABLET BY MOUTH EVERY 8 HOURS AS NEEDED FOR ANXIETY(DO NOT EXCEED MORE THAN 2 TABS IN 24 HRS), Disp: 60 tablet, Rfl: 2 .  MAGNESIUM OXIDE 400 PO, Take 400 mg by mouth 2 (two) times daily., Disp: , Rfl:  .  metoprolol succinate (TOPROL XL) 100 MG 24 hr tablet, Take 1 tablet (100 mg total) by mouth daily. Take with or immediately following a meal., Disp: 90 tablet, Rfl: 1 .  nitroGLYCERIN (NITROSTAT) 0.4 MG SL tablet, Place 1 tablet (0.4 mg total) under the tongue every 5 (five) minutes as needed for chest pain., Disp: 24 tablet, Rfl: 1 .  polyethylene glycol powder (GLYCOLAX/MIRALAX) powder, Take 17 grams by mouth in 8 ounces of water one to two times daily for constipation., Disp: 850 g, Rfl: 5 .  promethazine (PHENERGAN) 12.5 MG tablet, Take 2 tablets (25 mg total) by mouth every 6 (six) hours as needed for nausea., Disp: 12 tablet, Rfl: 0 .  SYRINGE/NEEDLE, DISP, 1 ML (BD ECLIPSE SYRINGE) 25G X 5/8" 1 ML MISC, Use for b12 injections, Disp: 100 each, Rfl: 3 No current facility-administered medications for this visit.   Facility-Administered Medications Ordered in Other Visits:  .  sodium chloride 0.9 % 1,000 mL with magnesium sulfate 2 g infusion, , Intravenous, Continuous, Ladell Pier, MD, Stopped at 08/04/14 1441  Review of Systems:  Constitutional: Denies fever, chills, diaphoresis, appetite change and fatigue.  HEENT: Denies photophobia, eye pain, redness, hearing loss, ear pain,  mouth sores, trouble swallowing, neck pain, neck stiffness and tinnitus.   Respiratory: Denies SOB, DOE,  chest tightness,  and wheezing.   Cardiovascular: Denies chest pain, palpitations and leg swelling.  Gastrointestinal: Denies nausea, vomiting, abdominal pain, diarrhea,  constipation, blood in stool and abdominal distention.  Genitourinary: Denies dysuria, urgency, frequency, hematuria, flank pain and difficulty urinating.  Endocrine: Denies: hot or cold intolerance, sweats, changes in hair or nails, polyuria, polydipsia. Musculoskeletal: Denies myalgias, back pain, joint swelling, arthralgias and gait problem.  Skin: Denies pallor, rash and wound.  Neurological: Denies dizziness, seizures, syncope, weakness, light-headedness, numbness and headaches.  Hematological: Denies adenopathy. Easy bruising, personal or family bleeding history  Psychiatric/Behavioral: Denies suicidal ideation, mood changes, confusion, nervousness, sleep disturbance and agitation   Assessment and Plan:  Acute bronchitis with COPD ([place])  -In an abundance of caution, and because I am unable to physically examine her, will send in a Rx for a Z-pak and tessalon perles. -Will touch base with her next week to make sure her symptoms are improving   I discussed the assessment and treatment plan with the patient. The patient was provided an opportunity to ask questions and all were answered. The patient agreed  with the plan and demonstrated an understanding of the instructions.   The patient was advised to call back or seek an in-person evaluation if the symptoms worsen or if the condition fails to improve as anticipated.  I provided 22 minutes of non-face-to-face time during this encounter.   Lelon Frohlich, MD [facility] at [facility]

## 2019-03-15 ENCOUNTER — Telehealth: Payer: Self-pay | Admitting: Internal Medicine

## 2019-03-15 NOTE — Telephone Encounter (Signed)
[facility] pharmacy called regarding patients hydrocodone rx.  They are cancelling the patients request because she got this filled [facility]/25/19 at [facility].  [employer] states that if patient wants this medication mail order she needs to have a new rx sent to [facility].

## 2019-03-15 NOTE — Telephone Encounter (Signed)
Copied from [place] [id number] [id number]. Topic: Quick Communication - Rx Refill/Question >> Mar 15, 2019  8:12 AM Oneta Rack wrote: Medication: Insulin Glargine, 2 Unit Dial, (TOUJEO MAX SOLOSTAR) 300 UNIT/ML SOPN  (requesting samples until medication is received) and would like B12 sent to mail order instead of retail.   Has the patient contacted their pharmacy? Yes   Preferred Pharmacy (with phone number or street name):  [place], [place] [phone] (Phone) [phone] (Fax)  Agent: Please be advised that RX refills may take up to 3 business days. We ask that you follow-up with your pharmacy.

## 2019-03-16 MED ORDER — CYANOCOBALAMIN 1000 MCG/ML IJ SOLN: 1000.0000 ug | Freq: Once | INTRAMUSCULAR | 6 refills | Status: AC

## 2019-03-22 ENCOUNTER — Telehealth: Payer: Self-pay | Admitting: *Deleted

## 2019-03-22 DIAGNOSIS — IMO0001 Reserved for inherently not codable concepts without codable children: Secondary | ICD-10-CM

## 2019-03-22 DIAGNOSIS — E119 Type 2 diabetes mellitus without complications: Principal | ICD-10-CM

## 2019-03-22 DIAGNOSIS — Z794 Long term (current) use of insulin: Principal | ICD-10-CM

## 2019-03-22 NOTE — Telephone Encounter (Addendum)
Dr. [name] suggests moving her 03/29/19 appointment out 2 months with port flush and lab due to [place] Precautions. Left VM for patient to return call to discuss her appointment. Noted her next visit at [facility] is 07/29/2019. Need to confirm her last port flush date (? 01/28/19 at [facility]). Called patient back on 03/23/19: Confirmed her last flush was Feb, so she is past due on flush. She will keep lab/flush on 03/29/19 and agrees to reschedule OV for 2 months. Also needs A1-C checked since her PCP's office is closed

## 2019-03-23 NOTE — Addendum Note (Signed)
Addended by: Tania Ade on: 03/23/2019 04:34 PM   Modules accepted: Orders

## 2019-03-24 ENCOUNTER — Telehealth: Payer: Self-pay | Admitting: Internal Medicine

## 2019-03-24 NOTE — Telephone Encounter (Signed)
[facility] pharmacy called needing dosage clarification on her

## 2019-03-29 ENCOUNTER — Inpatient Hospital Stay: Payer: Medicare HMO

## 2019-03-29 ENCOUNTER — Other Ambulatory Visit: Payer: Self-pay

## 2019-03-29 ENCOUNTER — Ambulatory Visit: Payer: Medicare HMO | Admitting: Oncology

## 2019-03-29 ENCOUNTER — Inpatient Hospital Stay: Payer: Medicare HMO | Attending: Oncology

## 2019-03-29 DIAGNOSIS — Z95828 Presence of other vascular implants and grafts: Secondary | ICD-10-CM

## 2019-03-29 DIAGNOSIS — C9501 Acute leukemia of unspecified cell type, in remission: Secondary | ICD-10-CM

## 2019-03-29 DIAGNOSIS — IMO0001 Reserved for inherently not codable concepts without codable children: Secondary | ICD-10-CM

## 2019-03-29 DIAGNOSIS — Z452 Encounter for adjustment and management of vascular access device: Secondary | ICD-10-CM | POA: Insufficient documentation

## 2019-03-29 DIAGNOSIS — D61818 Other pancytopenia: Secondary | ICD-10-CM | POA: Diagnosis not present

## 2019-03-29 DIAGNOSIS — E119 Type 2 diabetes mellitus without complications: Secondary | ICD-10-CM | POA: Insufficient documentation

## 2019-03-29 DIAGNOSIS — Z794 Long term (current) use of insulin: Principal | ICD-10-CM

## 2019-03-29 DIAGNOSIS — C9201 Acute myeloblastic leukemia, in remission: Secondary | ICD-10-CM | POA: Insufficient documentation

## 2019-03-29 LAB — CMP (CANCER CENTER ONLY)
ALT: 21 U/L (ref 0–44)
AST: 24 U/L (ref 15–41)
Albumin: 2.9 g/dL — ABNORMAL LOW (ref 3.5–5.0)
Alkaline Phosphatase: 190 U/L — ABNORMAL HIGH (ref 38–126)
Anion gap: 6 (ref 5–15)
BUN: 21 mg/dL (ref 8–23)
CO2: 23 mmol/L (ref 22–32)
Calcium: 7.9 mg/dL — ABNORMAL LOW (ref 8.9–10.3)
Chloride: 109 mmol/L (ref 98–111)
Creatinine: 1.48 mg/dL — ABNORMAL HIGH (ref 0.44–1.00)
GFR, Est AFR Am: 39 mL/min — ABNORMAL LOW (ref >60)
GFR, Estimated: 34 mL/min — ABNORMAL LOW (ref >60)
Glucose, Bld: 340 mg/dL — ABNORMAL HIGH (ref 70–99)
Potassium: 4 mmol/L (ref 3.5–5.1)
Sodium: 138 mmol/L (ref 135–145)
Total Bilirubin: 0.6 mg/dL (ref 0.3–1.2)
Total Protein: 5.8 g/dL — ABNORMAL LOW (ref 6.5–8.1)

## 2019-03-29 LAB — CBC WITH DIFFERENTIAL (CANCER CENTER ONLY)
Abs Immature Granulocytes: 0.02 10*3/uL (ref 0.00–0.07)
Basophils Absolute: 0 10*3/uL (ref 0.0–0.1)
Basophils Relative: 0 %
Eosinophils Absolute: 0.1 10*3/uL (ref 0.0–0.5)
Eosinophils Relative: 1 %
HCT: 35.6 % — ABNORMAL LOW (ref 36.0–46.0)
Hemoglobin: 11.8 g/dL — ABNORMAL LOW (ref 12.0–15.0)
Immature Granulocytes: 0 %
Lymphocytes Relative: 29 %
Lymphs Abs: 1.9 10*3/uL (ref 0.7–4.0)
MCH: 31.6 pg (ref 26.0–34.0)
MCHC: 33.1 g/dL (ref 30.0–36.0)
MCV: 95.2 fL (ref 80.0–100.0)
Monocytes Absolute: 0.5 10*3/uL (ref 0.1–1.0)
Monocytes Relative: 7 %
Neutro Abs: 4.1 10*3/uL (ref 1.7–7.7)
Neutrophils Relative %: 63 %
Platelet Count: 121 10*3/uL — ABNORMAL LOW (ref 150–400)
RBC: 3.74 MIL/uL — ABNORMAL LOW (ref 3.87–5.11)
RDW: 12.5 % (ref 11.5–15.5)
WBC Count: 6.6 10*3/uL (ref 4.0–10.5)
nRBC: 0 % (ref 0.0–0.2)

## 2019-03-29 LAB — HEMOGLOBIN A1C
Hgb A1c MFr Bld: 9.3 % — ABNORMAL HIGH (ref 4.8–5.6)
Mean Plasma Glucose: 220.21 mg/dL

## 2019-03-29 MED ORDER — SODIUM CHLORIDE 0.9% FLUSH
10.0000 mL | INTRAVENOUS | Status: DC | PRN
  Administered 2019-03-29: 15:00:00 10 mL via INTRAVENOUS
  Filled 2019-03-29: qty 10

## 2019-03-29 MED ORDER — HEPARIN SOD (PORK) LOCK FLUSH 100 UNIT/ML IV SOLN
500.0000 [IU] | Freq: Once | INTRAVENOUS | Status: AC
  Administered 2019-03-29: 500 [IU] via INTRAVENOUS
  Filled 2019-03-29: qty 5

## 2019-03-29 NOTE — Patient Instructions (Signed)

## 2019-03-31 ENCOUNTER — Other Ambulatory Visit: Payer: Self-pay | Admitting: Internal Medicine

## 2019-03-31 DIAGNOSIS — I10 Essential (primary) hypertension: Secondary | ICD-10-CM

## 2019-04-05 ENCOUNTER — Other Ambulatory Visit: Payer: Self-pay

## 2019-04-15 ENCOUNTER — Telehealth: Payer: Self-pay

## 2019-04-15 NOTE — Telephone Encounter (Signed)
Left message for pt to call back about upcoming appt and her preference of video or phone visit.

## 2019-04-22 ENCOUNTER — Telehealth: Payer: Self-pay | Admitting: Internal Medicine

## 2019-04-22 NOTE — Telephone Encounter (Signed)
Will need office visit for further refills.  Can reschedule appointment from 04/27/2019 to earlier day with a telephone visit

## 2019-04-22 NOTE — Telephone Encounter (Signed)
Copied from [place] [id number]. Topic: Quick Communication - Rx Refill/Question >> Apr 22, 2019  1:09 PM Blase Mess A wrote: Medication: HYDROcodone-acetaminophen (NORCO/VICODIN) 5-325 MG tablet [[id number]] - requesting a 3 month supply,   Has the patient contacted their pharmacy? yes (Agent: If no, request that the patient contact the pharmacy for the refill.) (Agent: If yes, when and what did the pharmacy advise?)  Preferred Pharmacy (with phone number or street name): [facility] [phone] - [place], [address] AT [address] [phone] (Phone) [phone] (Fax)    Agent: Please be advised that RX refills may take up to 3 business days. We ask that you follow-up with your pharmacy.

## 2019-04-25 NOTE — Telephone Encounter (Signed)
Spoke with pt and she gave verbal consent for a phone visit for her appt. Pt does not have capability to take vitals at home but will be ready for appt.   YOUR CARDIOLOGY TEAM HAS ARRANGED FOR AN E-VISIT FOR YOUR APPOINTMENT - PLEASE REVIEW IMPORTANT INFORMATION BELOW SEVERAL DAYS PRIOR TO YOUR APPOINTMENT  Due to the recent COVID-19 pandemic, we are transitioning in-person office visits to tele-medicine visits in an effort to decrease unnecessary exposure to our patients, their families, and staff. These visits are billed to your insurance just like a normal visit is. We also encourage you to sign up for MyChart if you have not already done so. You will need a smartphone if possible. For patients that do not have this, we can still complete the visit using a regular telephone but do prefer a smartphone to enable video when possible. You may have a family member that lives with you that can help. If possible, we also ask that you have a blood pressure cuff and scale at home to measure your blood pressure, heart rate and weight prior to your scheduled appointment. Patients with clinical needs that need an in-person evaluation and testing will still be able to come to the office if absolutely necessary. If you have any questions, feel free to call our office.     YOUR PROVIDER WILL BE USING THE FOLLOWING PLATFORM TO COMPLETE YOUR VISIT: [email or web address]  . IF USING MYCHART - How to Download the MyChart App to Your SmartPhone   - If Apple, go to [employer] and type in MyChart in the search bar and download the app. If Android, ask patient to go to [employer] and type in [place] in the search bar and download the app. The app is free but as with any other app downloads, your phone may require you to verify saved payment information or Apple/Android password.  - You will need to then log into the app with your MyChart username and password, and select [facility] as your healthcare provider to link the  account.  - When it is time for your visit, go to the MyChart app, find appointments, and click Begin Video Visit. Be sure to Select Allow for your device to access the Microphone and Camera for your visit. You will then be connected, and your provider will be with you shortly.  **If you have any issues connecting or need assistance, please contact MyChart service desk [phone])**  **If using a computer, in order to ensure the best quality for your visit, you will need to use either of the following Internet Browsers: Insurance underwriter or [employer]**  . IF USING DOXIMITY or [email or web address] - The staff will give you instructions on receiving your link to join the meeting the day of your visit.      2-3 DAYS BEFORE YOUR APPOINTMENT  You will receive a telephone call from one of our [facility] team members - your caller ID may say "Unknown caller." If this is a video visit, we will walk you through how to get the video launched on your phone. We will remind you check your blood pressure, heart rate and weight prior to your scheduled appointment. If you have an Apple Watch or Kardia, please upload any pertinent ECG strips the day before or morning of your appointment to [facility]. Our staff will also make sure you have reviewed the consent and agree to move forward with your scheduled tele-health visit.     THE  DAY OF YOUR APPOINTMENT  Approximately 15 minutes prior to your scheduled appointment, you will receive a telephone call from one of [facility] team - your caller ID may say "Unknown caller."  Our staff will confirm medications, vital signs for the day and any symptoms you may be experiencing. Please have this information available prior to the time of visit start. It may also be helpful for you to have a pad of paper and pen handy for any instructions given during your visit. They will also walk you through joining the smartphone meeting if this is a video visit.    CONSENT FOR  TELE-HEALTH VISIT - PLEASE REVIEW  I hereby voluntarily request, consent and authorize [facility] and its employed or contracted physicians, physician assistants, nurse practitioners or other licensed health care professionals (the Practitioner), to provide me with telemedicine health care services (the "Services") as deemed necessary by the treating Practitioner. I acknowledge and consent to receive the Services by the Practitioner via telemedicine. I understand that the telemedicine visit will involve communicating with the Practitioner through live audiovisual communication technology and the disclosure of certain medical information by electronic transmission. I acknowledge that I have been given the opportunity to request an in-person assessment or other available alternative prior to the telemedicine visit and am voluntarily participating in the telemedicine visit.  I understand that I have the right to withhold or withdraw my consent to the use of telemedicine in the course of my care at any time, without affecting my right to future care or treatment, and that the Practitioner or I may terminate the telemedicine visit at any time. I understand that I have the right to inspect all information obtained and/or recorded in the course of the telemedicine visit and may receive copies of available information for a reasonable fee.  I understand that some of the potential risks of receiving the Services via telemedicine include:  Marland Kitchen Delay or interruption in medical evaluation due to technological equipment failure or disruption; . Information transmitted may not be sufficient (e.g. poor resolution of images) to allow for appropriate medical decision making by the Practitioner; and/or  . In rare instances, security protocols could fail, causing a breach of personal health information.  Furthermore, I acknowledge that it is my responsibility to provide information about my medical history, conditions and  care that is complete and accurate to the best of my ability. I acknowledge that Practitioner's advice, recommendations, and/or decision may be based on factors not within their control, such as incomplete or inaccurate data provided by me or distortions of diagnostic images or specimens that may result from electronic transmissions. I understand that the practice of medicine is not an exact science and that Practitioner makes no warranties or guarantees regarding treatment outcomes. I acknowledge that I will receive a copy of this consent concurrently upon execution via email to the email address I last provided but may also request a printed copy by calling the office of [facility].    I understand that my insurance will be billed for this visit.   I have read or had this consent read to me. . I understand the contents of this consent, which adequately explains the benefits and risks of the Services being provided via telemedicine.  . I have been provided ample opportunity to ask questions regarding this consent and the Services and have had my questions answered to my satisfaction. . I give my informed consent for the services to be provided through the use  of telemedicine in my medical care  By participating in this telemedicine visit I agree to the above.

## 2019-04-26 ENCOUNTER — Other Ambulatory Visit: Payer: Self-pay

## 2019-04-26 ENCOUNTER — Ambulatory Visit: Payer: Medicare HMO | Admitting: Internal Medicine

## 2019-04-26 ENCOUNTER — Ambulatory Visit (INDEPENDENT_AMBULATORY_CARE_PROVIDER_SITE_OTHER): Payer: Medicare HMO | Admitting: Internal Medicine

## 2019-04-26 DIAGNOSIS — C9201 Acute myeloblastic leukemia, in remission: Secondary | ICD-10-CM

## 2019-04-26 DIAGNOSIS — M549 Dorsalgia, unspecified: Secondary | ICD-10-CM

## 2019-04-26 DIAGNOSIS — F419 Anxiety disorder, unspecified: Secondary | ICD-10-CM | POA: Diagnosis not present

## 2019-04-26 DIAGNOSIS — IMO0001 Reserved for inherently not codable concepts without codable children: Secondary | ICD-10-CM

## 2019-04-26 DIAGNOSIS — I10 Essential (primary) hypertension: Secondary | ICD-10-CM

## 2019-04-26 DIAGNOSIS — G8929 Other chronic pain: Secondary | ICD-10-CM | POA: Diagnosis not present

## 2019-04-26 DIAGNOSIS — Z794 Long term (current) use of insulin: Secondary | ICD-10-CM | POA: Diagnosis not present

## 2019-04-26 DIAGNOSIS — E119 Type 2 diabetes mellitus without complications: Secondary | ICD-10-CM | POA: Diagnosis not present

## 2019-04-26 MED ORDER — LORAZEPAM 0.5 MG PO TABS: ORAL_TABLET | ORAL | 2 refills | Status: DC

## 2019-04-26 MED ORDER — HYDROCODONE-ACETAMINOPHEN 5-325 MG PO TABS: 1.0000 | ORAL_TABLET | Freq: Four times a day (QID) | ORAL | 0 refills | Status: DC | PRN

## 2019-04-26 MED ORDER — "SYRINGE/NEEDLE (DISP) 25G X 5/8"" 1 ML MISC": 3 refills | Status: DC

## 2019-04-26 NOTE — Progress Notes (Signed)
Virtual Visit via Telephone Note  I connected with [name] on 04/26/19 at  3:30 PM EDT by telephone and verified that I am speaking with the correct person using two identifiers.   I discussed the limitations, risks, security and privacy concerns of performing an evaluation and management service by telephone and the availability of in person appointments. I also discussed with the patient that there may be a patient responsible charge related to this service. The patient expressed understanding and agreed to proceed.  We initially attempted to connect via video chat but were unable to due to technical difficulties on the patient's end, so we converted this visit to a phone visit.   Location patient: home Location provider: work office Participants present for the call: patient, provider Patient did not have a visit in the prior 7 days to address this/these issue(s).   History of Present Illness:  This is a scheduled visit for medication refills per opioid contract as well as for BDZ refills, also to discuss DM.  Last visit we had sent out a referral to endocrine as she has very labile DM with frequent hypoglycemia at random times of the day in the 60s with highs of 330 and on average around 150-200 per patient report. Her A1c increased to 9.3 from 8.6 last visit.   Observations/Objective: Patient sounds cheerful and well on the phone. I do not appreciate any increased work of breathing. Speech and thought processing are grossly intact. Patient reported vitals: none reported   Current Outpatient Medications:  .  amLODipine (NORVASC) 5 MG tablet, TAKE 1 TABLET EVERY DAY, Disp: 90 tablet, Rfl: 1 .  aspirin EC 81 MG tablet, Take 1 tablet (81 mg total) by mouth daily., Disp: , Rfl:  .  atorvastatin (LIPITOR) 40 MG tablet, TAKE 1 TABLET EVERY DAY, Disp: 90 tablet, Rfl: 1 .  azithromycin (ZITHROMAX Z-PAK) 250 MG tablet, As directed, Disp: 6 each, Rfl: 0 .  BD VEO INSULIN  SYRINGE U/F 31G X 15/64" 0.3 ML MISC, USE THREE TIMES DAILY, Disp: 100 each, Rfl: 1 .  benzonatate (TESSALON) 100 MG capsule, Take 1 capsule (100 mg total) by mouth 2 (two) times daily as needed for cough., Disp: 20 capsule, Rfl: 0 .  Blood Glucose Monitoring Suppl (TRUE METRIX GO GLUCOSE METER) w/Device KIT, 30 Units by Does not apply route daily., Disp: 1 kit, Rfl: 0 .  clopidogrel (PLAVIX) 75 MG tablet, Take 1 tablet (75 mg total) by mouth every evening., Disp: 90 tablet, Rfl: 1 .  fluticasone furoate-vilanterol (BREO ELLIPTA) 200-25 MCG/INH AEPB, Inhale 1 puff into the lungs daily., Disp: 120 each, Rfl: 5 .  furosemide (LASIX) 40 MG tablet, Take 1 tablet (40 mg total) by mouth daily as needed for edema., Disp: 90 tablet, Rfl: 1 .  glucose blood (TRUE METRIX BLOOD GLUCOSE TEST) test strip, Use to test glucose 3 times daily. Dx e11.9  Z79.4 - [employer], Disp: 300 each, Rfl: 12 .  HYDROcodone-acetaminophen (NORCO/VICODIN) 5-325 MG tablet, Take 1 tablet by mouth every 6 (six) hours as needed for moderate pain., Disp: 120 tablet, Rfl: 0 .  HYDROcodone-acetaminophen (NORCO/VICODIN) 5-325 MG tablet, Take 1 tablet by mouth every 6 (six) hours as needed for moderate pain., Disp: 120 tablet, Rfl: 0 .  HYDROcodone-acetaminophen (NORCO/VICODIN) 5-325 MG tablet, Take 1 tablet by mouth every 6 (six) hours as needed for moderate pain., Disp: 120 tablet, Rfl: 0 .  HYDROcodone-homatropine (HYDROMET) 5-1.5 MG/5ML syrup, Take 5 mLs by mouth every  4 (four) hours as needed., Disp: 240 mL, Rfl: 0 .  Insulin Glargine (TOUJEO SOLOSTAR) 300 UNIT/ML SOPN, Inject 40 Units into the skin at bedtime., Disp: 1 pen, Rfl: 6 .  Insulin Glargine, 2 Unit Dial, (TOUJEO MAX SOLOSTAR) 300 UNIT/ML SOPN, Inject 40 Units into the skin at bedtime., Disp: 15 mL, Rfl: 0 .  Insulin Pen Needle (PEN NEEDLES 3/16") 31G X 5 MM MISC, Use with Novolin R, Disp: 100 each, Rfl: 0 .  insulin regular (NOVOLIN R) 100 units/mL injection, INJECT 10 UNITS  SUBCUTANEOUSLY 3 TIMES DAILY. INJECT 4 EXTRA UNITS IF CBG OVER 200. INJECT 6 EXTRA UNITS IF CBG OVER 300, Disp: 20 mL, Rfl: 1 .  isosorbide mononitrate (IMDUR) 30 MG 24 hr tablet, Take 1.5 tablets (45 mg total) by mouth daily., Disp: 135 tablet, Rfl: 3 .  LORazepam (ATIVAN) 0.5 MG tablet, TAKE 1 TABLET BY MOUTH EVERY 8 HOURS AS NEEDED FOR ANXIETY(DO NOT EXCEED MORE THAN 2 TABS IN 24 HRS), Disp: 60 tablet, Rfl: 2 .  MAGNESIUM OXIDE 400 PO, Take 400 mg by mouth 2 (two) times daily., Disp: , Rfl:  .  metoprolol succinate (TOPROL XL) 100 MG 24 hr tablet, Take 1 tablet (100 mg total) by mouth daily. Take with or immediately following a meal., Disp: 90 tablet, Rfl: 1 .  nitroGLYCERIN (NITROSTAT) 0.4 MG SL tablet, Place 1 tablet (0.4 mg total) under the tongue every 5 (five) minutes as needed for chest pain., Disp: 24 tablet, Rfl: 1 .  polyethylene glycol powder (GLYCOLAX/MIRALAX) powder, Take 17 grams by mouth in 8 ounces of water one to two times daily for constipation., Disp: 850 g, Rfl: 5 .  promethazine (PHENERGAN) 12.5 MG tablet, Take 2 tablets (25 mg total) by mouth every 6 (six) hours as needed for nausea., Disp: 12 tablet, Rfl: 0 .  SYRINGE/NEEDLE, DISP, 1 ML (BD ECLIPSE SYRINGE) 25G X 5/8" 1 ML MISC, Use for b12 injections, Disp: 100 each, Rfl: 3 No current facility-administered medications for this visit.   Facility-Administered Medications Ordered in Other Visits:  .  sodium chloride 0.9 % 1,000 mL with magnesium sulfate 2 g infusion, , Intravenous, Continuous, Ladell Pier, MD, Stopped at 08/04/14 1441  Review of Systems:  Constitutional: Denies fever, chills, diaphoresis, appetite change and fatigue.  HEENT: Denies photophobia, eye pain, redness, hearing loss, ear pain, congestion, sore throat, rhinorrhea, sneezing, mouth sores, trouble swallowing, neck pain, neck stiffness and tinnitus.   Respiratory: Denies SOB, DOE, cough, chest tightness,  and wheezing.   Cardiovascular: Denies  chest pain, palpitations and leg swelling.  Gastrointestinal: Denies nausea, vomiting, abdominal pain, diarrhea, constipation, blood in stool and abdominal distention.  Genitourinary: Denies dysuria, urgency, frequency, hematuria, flank pain and difficulty urinating.  Endocrine: Denies: hot or cold intolerance, sweats, changes in hair or nails, polyuria, polydipsia. Musculoskeletal: Denies myalgias, back pain, joint swelling, arthralgias and gait problem.  Skin: Denies pallor, rash and wound.  Neurological: Denies dizziness, seizures, syncope, weakness, light-headedness, numbness and headaches.  Hematological: Denies adenopathy. Easy bruising, personal or family bleeding history  Psychiatric/Behavioral: Denies suicidal ideation, mood changes, confusion, nervousness, sleep disturbance and agitation   Assessment and Plan:  Insulin dependent diabetes mellitus ([place]) -Send out referral to endocrine. Will discuss with referrals coordinator.  Anxiety -Refill lorazepam x 3 months.  Essential hypertension -Has been well controlled in past. -Advised routine ambulatory checks.  Chronic bilateral back pain, unspecified back location  NCCSRS reviewed in EPIC   Indication for chronic opioid: chronic back, neck  and hip pain Medication and dose: hydrocodone 5/325 mg q6h PRN # pills per month: 120 Last UDS date:  Opioid Treatment Agreement signed: yes Opioid Treatment Agreement last reviewed with patient: 10/27/18 NCCSRS reviewed this encounter (include red flags):  yes, no red flags, overdose risk score 270. I am sole prescriber.     I discussed the assessment and treatment plan with the patient. The patient was provided an opportunity to ask questions and all were answered. The patient agreed with the plan and demonstrated an understanding of the instructions.   The patient was advised to call back or seek an in-person evaluation if the symptoms worsen or if the condition fails to improve as  anticipated.  I provided 23 minutes of non-face-to-face time during this encounter.   Lelon Frohlich, MD [facility]

## 2019-04-27 ENCOUNTER — Ambulatory Visit: Payer: Medicare HMO | Admitting: Internal Medicine

## 2019-04-28 ENCOUNTER — Encounter: Payer: Self-pay | Admitting: Cardiovascular Disease

## 2019-04-28 ENCOUNTER — Other Ambulatory Visit: Payer: Self-pay

## 2019-04-28 ENCOUNTER — Telehealth (INDEPENDENT_AMBULATORY_CARE_PROVIDER_SITE_OTHER): Payer: Medicare HMO | Admitting: Cardiovascular Disease

## 2019-04-28 VITALS — Ht 62.0 in

## 2019-04-28 DIAGNOSIS — Z7189 Other specified counseling: Secondary | ICD-10-CM | POA: Diagnosis not present

## 2019-04-28 DIAGNOSIS — I5042 Chronic combined systolic (congestive) and diastolic (congestive) heart failure: Secondary | ICD-10-CM | POA: Diagnosis not present

## 2019-04-28 DIAGNOSIS — I2510 Atherosclerotic heart disease of native coronary artery without angina pectoris: Secondary | ICD-10-CM

## 2019-04-28 DIAGNOSIS — I10 Essential (primary) hypertension: Secondary | ICD-10-CM | POA: Diagnosis not present

## 2019-04-28 MED ORDER — POTASSIUM CHLORIDE CRYS ER 10 MEQ PO TBCR: 10.0000 meq | EXTENDED_RELEASE_TABLET | Freq: Every day | ORAL | 3 refills | Status: DC

## 2019-04-28 NOTE — Progress Notes (Signed)
Virtual Visit via Telephone Note   This visit type was conducted due to national recommendations for restrictions regarding the COVID-19 Pandemic (e.g. social distancing) in an effort to limit this patient's exposure and mitigate transmission in our community.  Due to her co-morbid illnesses, this patient is at least at moderate risk for complications without adequate follow up.  This format is felt to be most appropriate for this patient at this time.  The patient did not have access to video technology/had technical difficulties with video requiring transitioning to audio format only (telephone).  All issues noted in this document were discussed and addressed.  No physical exam could be performed with this format.  Please refer to the patient's chart for her  consent to telehealth for [facility].   Date:  04/28/2019   ID:  [name], DOB 12-Oct-1941, MRN [id number]  Patient Location: Home Provider Location: Office  PCP:  Isaac Bliss, Rayford Halsted, MD  Cardiologist:  Mertie Moores, MD  Electrophysiologist:  None   Evaluation Performed:  Follow-Up Visit  Previous notes by Richardson Dopp, PA   [name] is a 78 y.o. female with coronary artery disease, acute myelogenous leukemia in remission since 2015, chronic kidney disease stage IV, chronic anemia, COPD, atrial fibrillation, combined systolic and diastolic heart failure.  She had a non-ST elevation myocardial infarction in September 2018 in the setting of acute on chronic combined systolic and diastolic heart failure.  EF was 40-45%.  She was initially treated medically due to her chronic kidney disease.  However, due to ongoing symptoms, she underwent PCI with a drug-eluting stent to the proximal RCA.  Initially, it was felt that the LAD could be intervened on if she had refractory angina.  However, it was later felt that the LAD was not amenable to PCI and medical therapy was continued.  She was admitted to the hospital with  aphasia December 2018.  She was initially worked up for a TIA.  However, she was ultimately placed on antiepileptic therapy.  She was last seen by Dr. Acie Fredrickson in 02/2018.    [name] returns for follow-up.  She is here today with her husband.  She notes some episodes of chest tightness recently.  She had some discomfort today after getting here.  Her symptoms are now resolved.  She denies any significant change in her shortness of breath.  She does not feel that her chest symptoms are getting worse.  They are not like her previous angina.  She denies syncope, PND, lower extremity swelling.  She denies any significant cough, wheezing or bleeding issues.  Of note, she ran out of her Keppra several months ago and has not resumed it.  She has not followed up with neurology.  Chief Complaint:  *CAD   Apr 28, 2019    [name] is a 78 y.o. female with CAD , COPD, CKD, anemia,  Chronic combined chf Echo in Sept.  2018-   EF 40$,   Apical akinesis.  P-HTN with est PA pressure of 51  Been feeling well Feet continue  to swell  Went back to work this week and her feet swelling has worsened since that time .   Typically works 20 hours a week . Typically goes down but not completely resolves at night  Has not tried compression hose or leg elevation  Increased her lasix to QD dosing instead of QOD .   Does not get any regular exercise .  Advised her  to ambulate.   She walks on occasion around the house but not specifically for exericise Glucose has been every elevated   The patient does not have symptoms concerning for COVID-19 infection (fever, chills, cough, or new shortness of breath).    Past Medical History:  Diagnosis Date  . acute myeloblastic leukemia (AML) dx'd 06/2014  . Acute pancreatitis   . Arthritis    "back" (09/17/2017)  . Atrial flutter ([place])   . Cardiomyopathy ([place])   . Chronic kidney disease, stage II (mild)   . Chronic lower back pain   . Colon polyps 04/29/2010   TUBULAR  ADENOMA AND A SERRATED ADENOMA  . COPD (chronic obstructive pulmonary disease) ([place])   . CORONARY ARTERY DISEASE 12/24/2007  . DIABETES MELLITUS, TYPE II 07/13/2007  . History of blood transfusion 2015   "related to leukemia"  . History of kidney stones 12/24/2007  . HYPERLIPIDEMIA 12/24/2007  . HYPERTENSION 07/13/2007  . NSTEMI (non-ST elevated myocardial infarction) ([place]) 09/17/2017  . Unspecified disease of pancreas   . Uterine cancer [facility])    Past Surgical History:  Procedure Laterality Date  . ABDOMINAL HYSTERECTOMY     "still have my ovaries"  . CARDIAC CATHETERIZATION  2002  . CARDIAC CATHETERIZATION  09/17/2017  . CATARACT EXTRACTION W/ INTRAOCULAR LENS  IMPLANT, BILATERAL Bilateral   . COLONOSCOPY W/ BIOPSIES AND POLYPECTOMY  2011  . CORONARY STENT INTERVENTION N/A 09/21/2017   Procedure: CORONARY STENT INTERVENTION;  Surgeon: Troy Sine, MD;  Location: [place] [facility];  Service: Cardiovascular;  Laterality: N/A;  . DILATION AND CURETTAGE OF UTERUS    . EXCISIONAL HEMORRHOIDECTOMY  X 2  . GANGLION CYST EXCISION Left X 3  . INTRAVASCULAR PRESSURE WIRE/FFR STUDY N/A 09/17/2017   Procedure: INTRAVASCULAR PRESSURE WIRE/FFR STUDY;  Surgeon: Nelva Bush, MD;  Location: [place] [facility];  Service: Cardiovascular;  Laterality: N/A;  . NASAL SINUS SURGERY    . PORTA CATH INSERTION Right 2013  . RIGHT/LEFT HEART CATH AND CORONARY ANGIOGRAPHY N/A 09/17/2017   Procedure: RIGHT/LEFT HEART CATH AND CORONARY ANGIOGRAPHY;  Surgeon: Nelva Bush, MD;  Location: [place] [facility];  Service: Cardiovascular;  Laterality: N/A;  . TUBAL LIGATION       Current Meds  Medication Sig  . amLODipine (NORVASC) 5 MG tablet TAKE 1 TABLET EVERY DAY  . aspirin EC 81 MG tablet Take 1 tablet (81 mg total) by mouth daily.  Marland Kitchen atorvastatin (LIPITOR) 40 MG tablet TAKE 1 TABLET EVERY DAY  . BD VEO INSULIN SYRINGE U/F 31G X 15/64" 0.3 ML MISC USE THREE TIMES DAILY  . Blood Glucose Monitoring  Suppl (TRUE METRIX GO GLUCOSE METER) w/Device KIT 30 Units by Does not apply route daily.  . clopidogrel (PLAVIX) 75 MG tablet Take 1 tablet (75 mg total) by mouth every evening.  . fluticasone furoate-vilanterol (BREO ELLIPTA) 200-25 MCG/INH AEPB Inhale 1 puff into the lungs daily.  . furosemide (LASIX) 40 MG tablet Take 1 tablet (40 mg total) by mouth daily as needed for edema.  Marland Kitchen glucose blood (TRUE METRIX BLOOD GLUCOSE TEST) test strip Use to test glucose 3 times daily. Dx e11.9  Z79.4 - [employer]  . HYDROcodone-acetaminophen (NORCO/VICODIN) 5-325 MG tablet Take 1 tablet by mouth every 6 (six) hours as needed for moderate pain.  . Insulin Glargine (TOUJEO SOLOSTAR) 300 UNIT/ML SOPN Inject 40 Units into the skin at bedtime.  . Insulin Pen Needle (PEN NEEDLES 3/16") 31G X 5 MM MISC Use with Novolin R  .  insulin regular (NOVOLIN R) 100 units/mL injection INJECT 10 UNITS SUBCUTANEOUSLY 3 TIMES DAILY. INJECT 4 EXTRA UNITS IF CBG OVER 200. INJECT 6 EXTRA UNITS IF CBG OVER 300  . isosorbide mononitrate (IMDUR) 30 MG 24 hr tablet Take 1.5 tablets (45 mg total) by mouth daily.  Marland Kitchen LORazepam (ATIVAN) 0.5 MG tablet TAKE 1 TABLET BY MOUTH EVERY 8 HOURS AS NEEDED FOR ANXIETY(DO NOT EXCEED MORE THAN 2 TABS IN 24 HRS)  . MAGNESIUM OXIDE 400 PO Take 400 mg by mouth 2 (two) times daily.  . metoprolol succinate (TOPROL XL) 100 MG 24 hr tablet Take 1 tablet (100 mg total) by mouth daily. Take with or immediately following a meal.  . nitroGLYCERIN (NITROSTAT) 0.4 MG SL tablet Place 1 tablet (0.4 mg total) under the tongue every 5 (five) minutes as needed for chest pain.  . polyethylene glycol (MIRALAX / GLYCOLAX) 17 g packet Take 17 g by mouth as needed.  . potassium chloride (K-DUR) 10 MEQ tablet Take 1 tablet by mouth daily.  . promethazine (PHENERGAN) 12.5 MG tablet Take 2 tablets (25 mg total) by mouth every 6 (six) hours as needed for nausea.  Marland Kitchen SYRINGE/NEEDLE, DISP, 1 ML (BD ECLIPSE SYRINGE) 25G X 5/8" 1 ML MISC  Use for b12 injections     Allergies:   Patient has no known allergies.   Social History   Tobacco Use  . Smoking status: Former Smoker    Packs/day: 1.00    Years: 20.00    Pack years: 20.00    Types: Cigarettes    Last attempt to quit: 12/08/2000    Years since quitting: 18.3  . Smokeless tobacco: Never Used  Substance Use Topics  . Alcohol use: No    Alcohol/week: 0.0 standard drinks  . Drug use: No     Family Hx: The patient's family history includes Breast cancer in her sister; COPD in her father; Congestive Heart Failure in her mother; Heart attack in her maternal grandmother; Suicidality in her brother; Throat cancer in her brother and father; Uterine cancer in her sister. There is no history of Colon cancer.  ROS:   Please see the history of present illness.     All other systems reviewed and are negative.   Prior CV studies:   The following studies were reviewed today:    Labs/Other Tests and Data Reviewed:    EKG:  No ECG reviewed.  Recent Labs: 03/29/2019: ALT 21; BUN 21; Creatinine 1.48; Hemoglobin 11.8; Platelet Count 121; Potassium 4.0; Sodium 138   Recent Lipid Panel Lab Results  Component Value Date/Time   CHOL 130 02/17/2018 10:05 AM   TRIG 161 (H) 02/17/2018 10:05 AM   HDL 38 (L) 02/17/2018 10:05 AM   CHOLHDL 3.4 02/17/2018 10:05 AM   CHOLHDL 4.5 11/22/2017 03:54 AM   LDLCALC 60 02/17/2018 10:05 AM    Wt Readings from Last 3 Encounters:  01/25/19 192 lb 3.2 oz (87.2 kg)  12/03/18 197 lb (89.4 kg)  11/25/18 199 lb 1.6 oz (90.3 kg)     Objective:    Vital Signs:  Ht '5\' 2"'$  (1.575 m)   BMI 35.15 kg/m     No exam - telephone visit   ASSESSMENT & PLAN:    1. Chronic combined CHF:   Still admits to eating salty foods.  Also on amlodipine - and does not walk - all of which are contributing to her leg swelling   Advised ambulation,  Salt restriction,  Compression hose.  She has increased lasix to daily ,  Will add kdur. Check bmp in 3  weeks   Office visit with Richardson Dopp, PA in 6 months   2. CAD :  No angina   3.  CKD:   Per primary md   COVID-19 Education: The signs and symptoms of COVID-19 were discussed with the patient and how to seek care for testing (follow up with PCP or arrange E-visit).  The importance of social distancing was discussed today.  Time:   Today, I have spent  20  minutes with the patient with telehealth technology discussing the above problems.     Medication Adjustments/Labs and Tests Ordered: Current medicines are reviewed at length with the patient today.  Concerns regarding medicines are outlined above.   Tests Ordered: No orders of the defined types were placed in this encounter.   Medication Changes: No orders of the defined types were placed in this encounter.   Disposition:  Follow up in 6 month(s)  Signed, Mertie Moores, MD  04/28/2019 4:38 PM    [facility]

## 2019-04-28 NOTE — Patient Instructions (Addendum)
Medication Instructions:  Your physician has recommended you make the following change in your medication:  RESTART Kdur (Potassium chloride) 10 mEq daily A new prescription has been sent to your pharmacy  If you need a refill on your cardiac medications before your next appointment, please call your pharmacy.    Lab work: Your physician recommends that you return to our office for lab work on Thursday June 11 for basic metabolic panel  You may come in anytime between 7:30 and 4:30 pm. You do not have to fast for this test.   Testing/Procedures: None Ordered   Follow-Up: At [facility], you and your health needs are our priority.  As part of our continuing mission to provide you with exceptional heart care, we have created designated Provider Care Teams.  These Care Teams include your primary Cardiologist (physician) and Advanced Practice Providers (APPs -  Physician Assistants and Nurse Practitioners) who all work together to provide you with the care you need, when you need it. You will need a follow up appointment in:  6 months.  Please call our office 2 months in advance to schedule this appointment.  You may see Mertie Moores, MD or one of the following Advanced Practice Providers on your designated Care Team: Richardson Dopp, PA-C [place], [place] . Daune Perch, NP

## 2019-04-29 ENCOUNTER — Other Ambulatory Visit: Payer: Self-pay | Admitting: Internal Medicine

## 2019-05-03 ENCOUNTER — Other Ambulatory Visit: Payer: Self-pay

## 2019-05-04 ENCOUNTER — Ambulatory Visit (INDEPENDENT_AMBULATORY_CARE_PROVIDER_SITE_OTHER): Payer: Medicare HMO | Admitting: Internal Medicine

## 2019-05-04 ENCOUNTER — Encounter: Payer: Self-pay | Admitting: Internal Medicine

## 2019-05-04 ENCOUNTER — Other Ambulatory Visit: Payer: Self-pay

## 2019-05-04 VITALS — BP 124/68 | HR 72 | Temp 98.1°F | Ht 62.0 in | Wt 191.2 lb

## 2019-05-04 DIAGNOSIS — E1122 Type 2 diabetes mellitus with diabetic chronic kidney disease: Secondary | ICD-10-CM | POA: Diagnosis not present

## 2019-05-04 DIAGNOSIS — E1165 Type 2 diabetes mellitus with hyperglycemia: Secondary | ICD-10-CM | POA: Diagnosis not present

## 2019-05-04 DIAGNOSIS — E1159 Type 2 diabetes mellitus with other circulatory complications: Secondary | ICD-10-CM

## 2019-05-04 DIAGNOSIS — IMO0001 Reserved for inherently not codable concepts without codable children: Secondary | ICD-10-CM

## 2019-05-04 DIAGNOSIS — E119 Type 2 diabetes mellitus without complications: Secondary | ICD-10-CM

## 2019-05-04 DIAGNOSIS — N183 Chronic kidney disease, stage 3 unspecified: Secondary | ICD-10-CM | POA: Insufficient documentation

## 2019-05-04 DIAGNOSIS — Z794 Long term (current) use of insulin: Secondary | ICD-10-CM | POA: Insufficient documentation

## 2019-05-04 DIAGNOSIS — E1169 Type 2 diabetes mellitus with other specified complication: Secondary | ICD-10-CM | POA: Insufficient documentation

## 2019-05-04 LAB — GLUCOSE, POCT (MANUAL RESULT ENTRY): POC Glucose: 290 mg/dl — AB (ref 70–99)

## 2019-05-04 MED ORDER — INSULIN GLARGINE (2 UNIT DIAL) 300 UNIT/ML ~~LOC~~ SOPN: 32.0000 [IU] | PEN_INJECTOR | Freq: Every day | SUBCUTANEOUS | 6 refills | Status: DC

## 2019-05-04 MED ORDER — INSULIN REGULAR HUMAN 100 UNIT/ML IJ SOLN: 12.0000 [IU] | Freq: Three times a day (TID) | INTRAMUSCULAR | 3 refills | Status: DC

## 2019-05-04 MED ORDER — INSULIN REGULAR HUMAN 100 UNIT/ML IJ SOLN: 12.0000 [IU] | Freq: Three times a day (TID) | INTRAMUSCULAR | 6 refills | Status: DC

## 2019-05-04 MED ORDER — INSULIN GLARGINE (2 UNIT DIAL) 300 UNIT/ML ~~LOC~~ SOPN: 18.0000 [IU] | PEN_INJECTOR | Freq: Every day | SUBCUTANEOUS | 3 refills | Status: DC

## 2019-05-04 NOTE — Patient Instructions (Addendum)
-   Decrease Toujeo to 18 Units daily  - Increase Novolin-R to  12 units with each meal    -Choose healthy, lower carb lower calorie snacks: toss salad, cooked vegetables, cottage cheese, peanut butter, low fat cheese / string cheese, lower sodium deli meat, tuna salad or chicken salad    HOW TO TREAT LOW BLOOD SUGARS (Blood sugar LESS THAN 70 MG/DL)  Please follow the RULE OF 15 for the treatment of hypoglycemia treatment (when your (blood sugars are less than 70 mg/dL)    STEP 1: Take 15 grams of carbohydrates when your blood sugar is low, which includes:   3-4 GLUCOSE TABS  OR  3-4 OZ OF JUICE OR REGULAR SODA OR  ONE TUBE OF GLUCOSE GEL     STEP 2: RECHECK blood sugar in 15 MINUTES STEP 3: If your blood sugar is still low at the 15 minute recheck --> then, go back to STEP 1 and treat AGAIN with another 15 grams of carbohydrates.

## 2019-05-04 NOTE — Progress Notes (Signed)
Name: [name]  MRN/ DOB: [id number], 14-Jan-1941   Age/ Sex: 78 y.o., female    PCP: Isaac Bliss, Rayford Halsted, MD   Reason for Endocrinology Evaluation: Type 2 Diabetes Mellitus     Date of Initial Endocrinology Visit: 05/04/2019     PATIENT IDENTIFIER: [name] is a 78 y.o. female with a past medical history of T2DM, HTN, CAD, CHF , Hx of pancreatitis. The patient presented for initial endocrinology clinic visit on 05/04/2019 for consultative assistance with her diabetes management.    HPI: Ms. Kreis was    Diagnosed with T2DM in 2008 Prior Medications tried/Intolerance: Metformin, SU and pioglitazone Currently checking blood sugars 3 x / day,  before meals.  Hypoglycemia episodes : yes             Symptoms: sweating                  Frequency: 1/ month  Hemoglobin A1c has ranged from 6.6% in 2015, peaking at 12.4% in 2018. Patient required assistance for hypoglycemia: no Patient has required hospitalization within the last 1 year from hyper or hypoglycemia: no  In terms of diet, the patient drinks regular sodas, eats 2 meals a day, snacks 1-2x a day .   She continues to work at an [employer] place.    HOME DIABETES REGIMEN: Toujeo 40 units daily  Novolin-R 10 units Lunch and supper    Statin: yes ACE-I/ARB: no Prior Diabetic Education: no   METER DOWNLOAD SUMMARY:  This morning 110 mg/dL  Lunch 200's     DIABETIC COMPLICATIONS: Microvascular complications:   CKD III   Denies: neuropathy, retinopathy   Last eye exam: Completed 2019  Macrovascular complications:   CAD (S/P stent placement )  Denies: PVD, CVA   PAST HISTORY: Past Medical History:  Past Medical History:  Diagnosis Date  . acute myeloblastic leukemia (AML) dx'd 06/2014  . Acute pancreatitis   . Arthritis    "back" (09/17/2017)  . Atrial flutter ([place])   . Cardiomyopathy ([place])   . Chronic kidney disease, stage II (mild)   . Chronic lower back pain   . Colon polyps  04/29/2010   TUBULAR ADENOMA AND A SERRATED ADENOMA  . COPD (chronic obstructive pulmonary disease) ([place])   . CORONARY ARTERY DISEASE 12/24/2007  . DIABETES MELLITUS, TYPE II 07/13/2007  . History of blood transfusion 2015   "related to leukemia"  . History of kidney stones 12/24/2007  . HYPERLIPIDEMIA 12/24/2007  . HYPERTENSION 07/13/2007  . NSTEMI (non-ST elevated myocardial infarction) ([place]) 09/17/2017  . Unspecified disease of pancreas   . Uterine cancer [facility])    Past Surgical History:  Past Surgical History:  Procedure Laterality Date  . ABDOMINAL HYSTERECTOMY     "still have my ovaries"  . CARDIAC CATHETERIZATION  2002  . CARDIAC CATHETERIZATION  09/17/2017  . CATARACT EXTRACTION W/ INTRAOCULAR LENS  IMPLANT, BILATERAL Bilateral   . COLONOSCOPY W/ BIOPSIES AND POLYPECTOMY  2011  . CORONARY STENT INTERVENTION N/A 09/21/2017   Procedure: CORONARY STENT INTERVENTION;  Surgeon: Troy Sine, MD;  Location: [place] [facility];  Service: Cardiovascular;  Laterality: N/A;  . DILATION AND CURETTAGE OF UTERUS    . EXCISIONAL HEMORRHOIDECTOMY  X 2  . GANGLION CYST EXCISION Left X 3  . INTRAVASCULAR PRESSURE WIRE/FFR STUDY N/A 09/17/2017   Procedure: INTRAVASCULAR PRESSURE WIRE/FFR STUDY;  Surgeon: Nelva Bush, MD;  Location: [place] [facility];  Service: Cardiovascular;  Laterality: N/A;  .  NASAL SINUS SURGERY    . PORTA CATH INSERTION Right 2013  . RIGHT/LEFT HEART CATH AND CORONARY ANGIOGRAPHY N/A 09/17/2017   Procedure: RIGHT/LEFT HEART CATH AND CORONARY ANGIOGRAPHY;  Surgeon: Nelva Bush, MD;  Location: [place] [facility];  Service: Cardiovascular;  Laterality: N/A;  . TUBAL LIGATION        Social History:  reports that she quit smoking about 18 years ago. Her smoking use included cigarettes. She has a 20.00 pack-year smoking history. She has never used smokeless tobacco. She reports that she does not drink alcohol or use drugs. Family History:  Family History  Problem  Relation Age of Onset  . COPD Father   . Throat cancer Father   . Uterine cancer Sister        uterine ca and breast ca  . Breast cancer Sister   . Throat cancer Brother   . Suicidality Brother   . Congestive Heart Failure Mother   . Heart attack Maternal Grandmother   . Colon cancer Neg Hx       HOME MEDICATIONS: Allergies as of 05/04/2019   No Known Allergies     Medication List       Accurate as of May 04, 2019  3:42 PM. If you have any questions, ask your nurse or doctor.        amLODipine 5 MG tablet Commonly known as:  NORVASC TAKE 1 TABLET EVERY DAY   aspirin EC 81 MG tablet Take 1 tablet (81 mg total) by mouth daily.   atorvastatin 40 MG tablet Commonly known as:  LIPITOR TAKE 1 TABLET EVERY DAY   BD Veo Insulin Syringe U/F 31G X 15/64" 0.3 ML Misc Generic drug:  Insulin Syringe-Needle U-100 USE THREE TIMES DAILY   clopidogrel 75 MG tablet Commonly known as:  PLAVIX Take 1 tablet (75 mg total) by mouth every evening.   cyanocobalamin 1000 MCG/ML injection Commonly known as:  (VITAMIN B-12) ADMINISTER 1 ML(1000 MCG) IN THE MUSCLE 1 TIME FOR 1 DOSE   fluticasone furoate-vilanterol 200-25 MCG/INH Aepb Commonly known as:  Breo Ellipta Inhale 1 puff into the lungs daily.   furosemide 40 MG tablet Commonly known as:  LASIX Take 1 tablet (40 mg total) by mouth daily as needed for edema.   glucose blood test strip Commonly known as:  True Metrix Blood Glucose Test Use to test glucose 3 times daily. Dx e11.9  Z79.4 - [employer]   HYDROcodone-acetaminophen 5-325 MG tablet Commonly known as:  NORCO/VICODIN Take 1 tablet by mouth every 6 (six) hours as needed for moderate pain.   Insulin Glargine 300 UNIT/ML Sopn Commonly known as:  Toujeo SoloStar Inject 40 Units into the skin at bedtime.   insulin regular 100 units/mL injection Commonly known as:  NovoLIN R INJECT 10 UNITS SUBCUTANEOUSLY 3 TIMES DAILY. INJECT 4 EXTRA UNITS IF CBG OVER 200. INJECT 6  EXTRA UNITS IF CBG OVER 300   isosorbide mononitrate 30 MG 24 hr tablet Commonly known as:  IMDUR Take 1.5 tablets (45 mg total) by mouth daily.   LORazepam 0.5 MG tablet Commonly known as:  ATIVAN TAKE 1 TABLET BY MOUTH EVERY 8 HOURS AS NEEDED FOR ANXIETY(DO NOT EXCEED MORE THAN 2 TABS IN 24 HRS)   MAGNESIUM OXIDE 400 PO Take 400 mg by mouth 2 (two) times daily.   metoprolol succinate 100 MG 24 hr tablet Commonly known as:  Toprol XL Take 1 tablet (100 mg total) by mouth daily. Take with or immediately following  a meal.   nitroGLYCERIN 0.4 MG SL tablet Commonly known as:  NITROSTAT Place 1 tablet (0.4 mg total) under the tongue every 5 (five) minutes as needed for chest pain.   Pen Needles 3/16" 31G X 5 MM Misc Use with Novolin R   polyethylene glycol 17 g packet Commonly known as:  MIRALAX / GLYCOLAX Take 17 g by mouth as needed.   potassium chloride 10 MEQ tablet Commonly known as:  K-DUR Take 1 tablet (10 mEq total) by mouth daily.   promethazine 12.5 MG tablet Commonly known as:  PHENERGAN Take 2 tablets (25 mg total) by mouth every 6 (six) hours as needed for nausea.   SYRINGE/NEEDLE (DISP) 1 ML 25G X 5/8" 1 ML Misc Commonly known as:  BD Eclipse Syringe Use for b12 injections   True Metrix Go Glucose Meter w/Device Kit 30 Units by Does not apply route daily.        ALLERGIES: No Known Allergies   REVIEW OF SYSTEMS: A comprehensive ROS was conducted with the patient and is negative except as per HPI and below:  Review of Systems  Constitutional: Negative for fever and weight loss.  HENT: Negative for congestion and sore throat.   Eyes: Negative for blurred vision and discharge.  Respiratory: Negative for cough and shortness of breath.   Cardiovascular: Positive for leg swelling. Negative for palpitations.  Gastrointestinal: Negative for diarrhea and nausea.  Genitourinary: Negative for frequency.  Musculoskeletal: Positive for neck pain.   Neurological: Negative for tingling and tremors.  Endo/Heme/Allergies: Negative for polydipsia.  Psychiatric/Behavioral: Negative for depression. The patient is not nervous/anxious.       OBJECTIVE:   VITAL SIGNS: BP 124/68 (BP Location: Left Arm, Patient Position: Sitting, Cuff Size: Normal)   Pulse 72   Temp 98.1 F (36.7 C)   Ht '5\' 2"'$  (1.575 m)   Wt 191 lb 3.2 oz (86.7 kg)   SpO2 98%   BMI 34.97 kg/m    PHYSICAL EXAM:  General: Pt appears well and is in NAD  Hydration: Well-hydrated with moist mucous membranes and good skin turgor  HEENT: Head: Upper and lower dentures present. Oropharynx clear without exudate.  Eyes: External eye exam normal without stare, lid lag or exophthalmos.  EOM intact.  .  Neck: General: Supple without adenopathy or carotid bruits. Thyroid: Thyroid size normal.  No goiter or nodules appreciated. No thyroid bruit.  Lungs: Clear with good BS bilat with no rales, rhonchi, or wheezes  Heart: RRR with normal S1 and S2 and no gallops; no murmurs; no rub  Abdomen: Normoactive bowel sounds, soft, nontender, without masses or organomegaly palpable  Extremities:  Lower extremities - 1+ pretibial edema. No lesions.  Skin: Normal texture and temperature to palpation. Minimal  lipohypertrophy present at the lower abdominal wall  Neuro: MS is good with appropriate affect, pt is alert and Ox3    DM foot exam: 05/04/2019 The skin of the feet is intact without sores or ulcerations. The pedal pulses are 2+ on right and 2+ on left. The sensation is intact to a screening 5.07, 10 gram monofilament bilaterally   DATA REVIEWED:  Lab Results  Component Value Date   HGBA1C 9.3 (H) 03/29/2019   HGBA1C 8.6 (H) 10/27/2018   HGBA1C 9.5 (A) 07/20/2018   Lab Results  Component Value Date   MICROALBUR 43.5 (H) 10/27/2018   LDLCALC 60 02/17/2018   CREATININE 1.48 (H) 03/29/2019   Lab Results  Component Value Date   MICRALBCREAT 101.4 (  H) 10/27/2018    Lab  Results  Component Value Date   CHOL 130 02/17/2018   HDL 38 (L) 02/17/2018   LDLCALC 60 02/17/2018   TRIG 161 (H) 02/17/2018   CHOLHDL 3.4 02/17/2018        ASSESSMENT / PLAN / RECOMMENDATIONS:   1) Type 2 Diabetes Mellitus, Poorly controlled, With CKD III and macrovascular complications - Most recent A1c of 9.3 %. Goal A1c < 7.5 %.    Plan: GENERAL:  Poorly controlled diabetes due to dietary indiscretions . Pt drinks sugar-sweetened beverages, she admits not knowing what carbohydrates are. Today she ate a sausage biscuit for breakfast without insulin coverage, she also ate a honey dew bun with lunch,hence the office glucose of 290 mg/dL.   We will refer her to our CDE for proper education.  I have discussed with the patient the pathophysiology of diabetes. We went over the natural progression of the disease. We talked about both insulin resistance and insulin deficiency. We stressed the importance of lifestyle changes including diet and exercise. I explained the complications associated with diabetes including retinopathy, nephropathy, neuropathy as well as increased risk of cardiovascular disease. We went over the benefit seen with glycemic control.   I explained to the patient that diabetic patients are at higher than normal risk for amputations.   Even though her dose is Toujeo 40 units, she has been dialing her pen to 20 units, she is under the impression that since her current pen is more concentrated she was giving 20 units thinking its 40 units, but despite this pt states she has been having hypoglycemic episodes during the night, hence will reduce her basal insulin dose and given that her BG is still at 290 mg/dL hours after taking 10 units of regular insulin, will increase prandial insulin dose.   Pt is wondering  If she will ever be able to get off insulin, but I explained to her that unfortunately due to her deteriorating renal function, we are very limited in oral glycemic  agents. She has a hx of pancreatitis documented in the chart, which prohibits the use of GLP-1 agonists.   MEDICATIONS:  Decrease Toujeo to 18 units QHS  Increase Novolin- R to 12 units TID QAC  EDUCATION / INSTRUCTIONS:  BG monitoring instructions: Patient is instructed to check her blood sugars 4 times a day, before meals and bedtime.  Call [facility] if: BG persistently < 70 or > 300. . I reviewed the Rule of 15 for the treatment of hypoglycemia in detail with the patient. Literature supplied.   2) Diabetic complications:   Eye: Does not have known diabetic retinopathy.   Neuro/ Feet: Does not have known diabetic peripheral neuropathy. Renal: Patient does have known baseline CKD. She is not on an ACEI/ARB at present, she used to be on lisinopril, pt can not recall the reason for discontinuation. It is not listed on her allergy list. I will defer starting her on ACEI or an ARB to her PCP , as long as no contra-indications exist.  3) Lipids: Patient is on a statin.    4) Hypertension: She is at goal of < 140/90 mmHg.   F/u in 2 months     Signed electronically by: Mack Guise, MD  [facility]  [name] [address] [place], [place] [address] Phone: [phone] FAX: [phone]   CC: Isaac Bliss, Rayford Halsted, MD [address] [place] [address] Phone: [phone]  Fax: [phone]    Return to Endocrinology clinic as below: Future Appointments  Date Time Provider [place]  05/19/2019  9:15 AM [facility]

## 2019-05-05 ENCOUNTER — Encounter: Payer: Self-pay | Admitting: Internal Medicine

## 2019-05-09 ENCOUNTER — Telehealth: Payer: Self-pay | Admitting: Oncology

## 2019-05-09 NOTE — Telephone Encounter (Signed)
Returned patient's phone call regarding scheduling an appointment, left a voicemail. 

## 2019-05-10 ENCOUNTER — Other Ambulatory Visit: Payer: Self-pay | Admitting: Internal Medicine

## 2019-05-10 DIAGNOSIS — Z1231 Encounter for screening mammogram for malignant neoplasm of breast: Secondary | ICD-10-CM

## 2019-05-19 ENCOUNTER — Other Ambulatory Visit: Payer: Medicare HMO | Admitting: *Deleted

## 2019-05-19 ENCOUNTER — Other Ambulatory Visit: Payer: Self-pay

## 2019-05-19 DIAGNOSIS — I5042 Chronic combined systolic (congestive) and diastolic (congestive) heart failure: Secondary | ICD-10-CM

## 2019-05-19 DIAGNOSIS — I2510 Atherosclerotic heart disease of native coronary artery without angina pectoris: Secondary | ICD-10-CM

## 2019-05-20 LAB — BASIC METABOLIC PANEL
BUN/Creatinine Ratio: 16 (ref 12–28)
BUN: 22 mg/dL (ref 8–27)
CO2: 22 mmol/L (ref 20–29)
Calcium: 8.1 mg/dL — ABNORMAL LOW (ref 8.7–10.3)
Chloride: 107 mmol/L — ABNORMAL HIGH (ref 96–106)
Creatinine, Ser: 1.38 mg/dL — ABNORMAL HIGH (ref 0.57–1.00)
GFR calc Af Amer: 43 mL/min/{1.73_m2} — ABNORMAL LOW (ref >59)
GFR calc non Af Amer: 37 mL/min/{1.73_m2} — ABNORMAL LOW (ref >59)
Glucose: 237 mg/dL — ABNORMAL HIGH (ref 65–99)
Potassium: 3.9 mmol/L (ref 3.5–5.2)
Sodium: 141 mmol/L (ref 134–144)

## 2019-06-06 ENCOUNTER — Telehealth: Payer: Self-pay | Admitting: Oncology

## 2019-06-06 ENCOUNTER — Telehealth: Payer: Self-pay | Admitting: *Deleted

## 2019-06-06 NOTE — Telephone Encounter (Signed)
Called to request a port flush appointment for July. Scheduling message sent

## 2019-06-06 NOTE — Telephone Encounter (Signed)
Scheduled apt per 6/29 sch message - called pt - no answer The patient left the office before the visit was finished. Message for pt to call back to set up appt.

## 2019-06-13 ENCOUNTER — Inpatient Hospital Stay: Payer: Medicare HMO | Attending: Oncology

## 2019-06-13 ENCOUNTER — Telehealth: Payer: Self-pay | Admitting: Oncology

## 2019-06-13 ENCOUNTER — Other Ambulatory Visit: Payer: Self-pay

## 2019-06-13 DIAGNOSIS — C9201 Acute myeloblastic leukemia, in remission: Secondary | ICD-10-CM | POA: Diagnosis not present

## 2019-06-13 DIAGNOSIS — Z452 Encounter for adjustment and management of vascular access device: Secondary | ICD-10-CM | POA: Diagnosis not present

## 2019-06-13 DIAGNOSIS — Z95828 Presence of other vascular implants and grafts: Secondary | ICD-10-CM

## 2019-06-13 MED ORDER — SODIUM CHLORIDE 0.9 % IJ SOLN: 10.0000 mL | INTRAMUSCULAR | Status: DC | PRN

## 2019-06-13 MED ORDER — HEPARIN SOD (PORK) LOCK FLUSH 100 UNIT/ML IV SOLN
500.0000 [IU] | Freq: Once | INTRAVENOUS | Status: DC
  Filled 2019-06-13: qty 5

## 2019-06-13 MED ORDER — HEPARIN SOD (PORK) LOCK FLUSH 100 UNIT/ML IV SOLN
500.0000 [IU] | Freq: Once | INTRAVENOUS | Status: AC | PRN
  Administered 2019-06-13: 500 [IU] via INTRAVENOUS
  Filled 2019-06-13: qty 5

## 2019-06-13 MED ORDER — SODIUM CHLORIDE 0.9% FLUSH
10.0000 mL | INTRAVENOUS | Status: DC | PRN
  Administered 2019-06-13: 10 mL
  Filled 2019-06-13: qty 10

## 2019-06-13 MED ORDER — SODIUM CHLORIDE 0.9% FLUSH
10.0000 mL | INTRAVENOUS | Status: DC | PRN
  Filled 2019-06-13: qty 10

## 2019-06-13 NOTE — Telephone Encounter (Signed)
Scheduled port flush per patients request.

## 2019-07-01 ENCOUNTER — Other Ambulatory Visit: Payer: Self-pay

## 2019-07-01 ENCOUNTER — Ambulatory Visit (INDEPENDENT_AMBULATORY_CARE_PROVIDER_SITE_OTHER): Payer: Medicare HMO | Admitting: Internal Medicine

## 2019-07-01 VITALS — BP 136/72 | HR 69 | Temp 98.5°F | Ht 62.0 in | Wt 185.0 lb

## 2019-07-01 DIAGNOSIS — Z794 Long term (current) use of insulin: Secondary | ICD-10-CM

## 2019-07-01 DIAGNOSIS — E1165 Type 2 diabetes mellitus with hyperglycemia: Secondary | ICD-10-CM | POA: Diagnosis not present

## 2019-07-01 LAB — POCT GLYCOSYLATED HEMOGLOBIN (HGB A1C): Hemoglobin A1C: 7 % — AB (ref 4.0–5.6)

## 2019-07-01 NOTE — Patient Instructions (Signed)
-   Decrease Toujeo to 16 Units daily  - Continue Novolin-R at 12 units with each meal      HOW TO TREAT LOW BLOOD SUGARS (Blood sugar LESS THAN 70 MG/DL)  Please follow the RULE OF 15 for the treatment of hypoglycemia treatment (when your (blood sugars are less than 70 mg/dL)    STEP 1: Take 15 grams of carbohydrates when your blood sugar is low, which includes:   3-4 GLUCOSE TABS  OR  3-4 OZ OF JUICE OR REGULAR SODA OR  ONE TUBE OF GLUCOSE GEL     STEP 2: RECHECK blood sugar in 15 MINUTES STEP 3: If your blood sugar is still low at the 15 minute recheck --> then, go back to STEP 1 and treat AGAIN with another 15 grams of carbohydrates.

## 2019-07-01 NOTE — Progress Notes (Signed)
Name: [name]  Age/ Sex: 78 y.o., female   MRN/ DOB: [id number], [birth year]     PCP: Isaac Bliss, Rayford Halsted, MD   Reason for Endocrinology Evaluation: Type 2 Diabetes Mellitus  Initial Endocrine Consultative Visit: 05/04/2019    PATIENT IDENTIFIER: [name] is a 78 y.o. female with a past medical history of T2DM, HTN, CAD, CHF , Hx of pancreatitis. The patient has followed with Endocrinology clinic since 05/04/2019 for consultative assistance with management of her diabetes.  DIABETIC HISTORY:  Ms. [name] was diagnosed with T2DM in 2008, she was on metformin, SU and pioglitazone has been on and started insulin therapy many years ago. Her hemoglobin A1c has ranged from 6.6% in 2015, peaking at 12.4% in 2018.    She continues to work at an [employer] place.   SUBJECTIVE:   During the last visit (05/04/2019): A1c was 9.3% , we reduced toujeo and increased regular insulin   Today (07/01/2019): Ms. [name] is here for a 2 month follow up on diabetes management. She checks her blood sugars 2 times daily, preprandial to breakfast and supper. The patient has  had hypoglycemic episodes since the last clinic visit, which typically occur 2 x / month- most often occuring early morning. The patient is symptomatic with these episodes, with symptoms of shaking. Otherwise, the patient hasnot required any recent emergency interventions for hypoglycemia and has not had recent hospitalizations secondary to hyper or hypoglycemic episodes.    ROS: As per HPI and as detailed below: Review of Systems  Constitutional: Negative for chills and fever.  HENT: Negative for congestion and sore throat.   Eyes: Negative for redness.  Respiratory: Negative for cough and shortness of breath.   Cardiovascular: Positive for leg swelling.  Gastrointestinal: Negative for diarrhea and nausea.      HOME DIABETES REGIMEN:   Toujeo to 18 units QHS   Novolin- R to 12 units TID QAC     Glucose LOG:   BG 50's to 300's     HISTORY:  Past Medical History:  Past Medical History:  Diagnosis Date  . acute myeloblastic leukemia (AML) dx'd 06/2014  . Acute pancreatitis   . Arthritis    "back" (09/17/2017)  . Atrial flutter ([place])   . Cardiomyopathy ([place])   . Chronic kidney disease, stage II (mild)   . Chronic lower back pain   . Colon polyps 04/29/2010   TUBULAR ADENOMA AND A SERRATED ADENOMA  . COPD (chronic obstructive pulmonary disease) ([place])   . CORONARY ARTERY DISEASE 12/24/2007  . DIABETES MELLITUS, TYPE II 07/13/2007  . History of blood transfusion 2015   "related to leukemia"  . History of kidney stones 12/24/2007  . HYPERLIPIDEMIA 12/24/2007  . HYPERTENSION 07/13/2007  . NSTEMI (non-ST elevated myocardial infarction) ([place]) 09/17/2017  . Unspecified disease of pancreas   . Uterine cancer [facility])    Past Surgical History:  Past Surgical History:  Procedure Laterality Date  . ABDOMINAL HYSTERECTOMY     "still have my ovaries"  . CARDIAC CATHETERIZATION  2002  . CARDIAC CATHETERIZATION  09/17/2017  . CATARACT EXTRACTION W/ INTRAOCULAR LENS  IMPLANT, BILATERAL Bilateral   . COLONOSCOPY W/ BIOPSIES AND POLYPECTOMY  2011  . CORONARY STENT INTERVENTION N/A 09/21/2017   Procedure: CORONARY STENT INTERVENTION;  Surgeon: Troy Sine, MD;  Location: [facility];  Service: Cardiovascular;  Laterality: N/A;  . DILATION AND CURETTAGE OF UTERUS    . EXCISIONAL HEMORRHOIDECTOMY  X 2  .  GANGLION CYST EXCISION Left X 3  . INTRAVASCULAR PRESSURE WIRE/FFR STUDY N/A 09/17/2017   Procedure: INTRAVASCULAR PRESSURE WIRE/FFR STUDY;  Surgeon: Nelva Bush, MD;  Location: [facility];  Service: Cardiovascular;  Laterality: N/A;  . NASAL SINUS SURGERY    . PORTA CATH INSERTION Right 2013  . RIGHT/LEFT HEART CATH AND CORONARY ANGIOGRAPHY N/A 09/17/2017   Procedure: RIGHT/LEFT HEART CATH AND CORONARY ANGIOGRAPHY;  Surgeon: Nelva Bush, MD;  Location: [facility];   Service: Cardiovascular;  Laterality: N/A;  . TUBAL LIGATION      Social History:  reports that she quit smoking about 18 years ago. Her smoking use included cigarettes. She has a 20.00 pack-year smoking history. She has never used smokeless tobacco. She reports that she does not drink alcohol or use drugs. Family History:  Family History  Problem Relation Age of Onset  . COPD Father   . Throat cancer Father   . Uterine cancer Sister        uterine ca and breast ca  . Breast cancer Sister   . Throat cancer Brother   . Suicidality Brother   . Congestive Heart Failure Mother   . Heart attack Maternal Grandmother   . Colon cancer Neg Hx      HOME MEDICATIONS: Allergies as of 07/01/2019   No Known Allergies     Medication List       Accurate as of July 01, 2019  4:04 PM. If you have any questions, ask your nurse or doctor.        amLODipine 5 MG tablet Commonly known as: NORVASC TAKE 1 TABLET EVERY DAY   aspirin EC 81 MG tablet Take 1 tablet (81 mg total) by mouth daily.   atorvastatin 40 MG tablet Commonly known as: LIPITOR TAKE 1 TABLET EVERY DAY   BD Veo Insulin Syringe U/F 31G X 15/64" 0.3 ML Misc Generic drug: Insulin Syringe-Needle U-100 USE THREE TIMES DAILY   clopidogrel 75 MG tablet Commonly known as: PLAVIX Take 1 tablet (75 mg total) by mouth every evening.   cyanocobalamin 1000 MCG/ML injection Commonly known as: (VITAMIN B-12) ADMINISTER 1 ML(1000 MCG) IN THE MUSCLE 1 TIME FOR 1 DOSE   fluticasone furoate-vilanterol 200-25 MCG/INH Aepb Commonly known as: Breo Ellipta Inhale 1 puff into the lungs daily.   furosemide 40 MG tablet Commonly known as: LASIX Take 1 tablet (40 mg total) by mouth daily as needed for edema.   glucose blood test strip Commonly known as: True Metrix Blood Glucose Test Use to test glucose 3 times daily. Dx e11.9  Z79.4 - [employer]   HYDROcodone-acetaminophen 5-325 MG tablet Commonly known as: NORCO/VICODIN Take 1 tablet  by mouth every 6 (six) hours as needed for moderate pain.   Insulin Glargine (2 Unit Dial) 300 UNIT/ML Sopn Commonly known as: Toujeo Max SoloStar Inject 18 Units into the skin at bedtime.   insulin regular 100 units/mL injection Commonly known as: NovoLIN R Inject 0.12 mLs (12 Units total) into the skin 3 (three) times daily before meals. INJECT 10 UNITS SUBCUTANEOUSLY 3 TIMES DAILY. INJECT 4 EXTRA UNITS IF CBG OVER 200. INJECT 6 EXTRA UNITS IF CBG OVER 300   isosorbide mononitrate 30 MG 24 hr tablet Commonly known as: IMDUR Take 1.5 tablets (45 mg total) by mouth daily.   LORazepam 0.5 MG tablet Commonly known as: ATIVAN TAKE 1 TABLET BY MOUTH EVERY 8 HOURS AS NEEDED FOR ANXIETY(DO NOT EXCEED MORE THAN 2 TABS IN 24 HRS)  MAGNESIUM OXIDE 400 PO Take 400 mg by mouth 2 (two) times daily.   metoprolol succinate 100 MG 24 hr tablet Commonly known as: Toprol XL Take 1 tablet (100 mg total) by mouth daily. Take with or immediately following a meal.   nitroGLYCERIN 0.4 MG SL tablet Commonly known as: NITROSTAT Place 1 tablet (0.4 mg total) under the tongue every 5 (five) minutes as needed for chest pain.   Pen Needles 3/16" 31G X 5 MM Misc Use with Novolin R   polyethylene glycol 17 g packet Commonly known as: MIRALAX / GLYCOLAX Take 17 g by mouth as needed.   potassium chloride 10 MEQ tablet Commonly known as: K-DUR Take 1 tablet (10 mEq total) by mouth daily.   promethazine 12.5 MG tablet Commonly known as: PHENERGAN Take 2 tablets (25 mg total) by mouth every 6 (six) hours as needed for nausea.   SYRINGE/NEEDLE (DISP) 1 ML 25G X 5/8" 1 ML Misc Commonly known as: BD Eclipse Syringe Use for b12 injections   True Metrix Go Glucose Meter w/Device Kit 30 Units by Does not apply route daily.        OBJECTIVE:   Vital Signs: BP 136/72 (BP Location: Left Arm, Patient Position: Sitting, Cuff Size: Normal)   Pulse 69   Temp 98.5 F (36.9 C)   Ht '5\' 2"'$  (1.575 m)   Wt  185 lb (83.9 kg)   SpO2 97%   BMI 33.84 kg/m   Wt Readings from Last 3 Encounters:  07/01/19 185 lb (83.9 kg)  05/04/19 191 lb 3.2 oz (86.7 kg)  01/25/19 192 lb 3.2 oz (87.2 kg)     Exam: General: Pt appears well and is in NAD  Neck: General: Supple without adenopathy. Thyroid: Thyroid size normal.  No goiter or nodules appreciated. No thyroid bruit.  Lungs: Clear with good BS bilat with no rales, rhonchi, or wheezes  Heart: RRR with normal S1 and S2 and no gallops; no murmurs; no rub  Abdomen: Normoactive bowel sounds, soft, nontender, without masses or organomegaly palpable  Extremities: Trace pretibial edema. No tremor.   Skin: Normal texture and temperature to palpation. No rash noted. No Acanthosis nigricans/skin tags. No lipohypertrophy.  Neuro: MS is good with appropriate affect, pt is alert and Ox3       DM foot exam: 05/04/2019 The skin of the feet is intact without sores or ulcerations. The pedal pulses are 2+ on right and 2+ on left. The sensation is intact to a screening 5.07, 10 gram monofilament bilaterally       DATA REVIEWED:  Lab Results  Component Value Date   HGBA1C 7.0 (A) 07/01/2019   HGBA1C 9.3 (H) 03/29/2019   HGBA1C 8.6 (H) 10/27/2018   Lab Results  Component Value Date   MICROALBUR 43.5 (H) 10/27/2018   LDLCALC 60 02/17/2018   CREATININE 1.38 (H) 05/19/2019   Lab Results  Component Value Date   MICRALBCREAT 101.4 (H) 10/27/2018     Lab Results  Component Value Date   CHOL 130 02/17/2018   HDL 38 (L) 02/17/2018   LDLCALC 60 02/17/2018   TRIG 161 (H) 02/17/2018   CHOLHDL 3.4 02/17/2018         ASSESSMENT / PLAN / RECOMMENDATIONS:   1) Type 2 Diabetes Mellitus, Optimally controlled, With CKD III and Macrovascular complications - Most recent A1c of 7.0 %. Goal A1c < 7.5 %.    Plan:  - Praised the patient on glucose control, her A1c has trended down  from 9.3% to 7.0% but is partly due to hypoglycemia, which is most likely related  to basal insulin as her hypoglycemic episodes have been noted to occur in the morning.  - There's also evidence of insulin-CHO mismatch during the day , I have offered her a referral to our CDE to work on that but she has declined.     MEDICATIONS:  Decrease Toujeo to 16 units daily   Continue Novolin-R at 12 units [facility]  EDUCATION / INSTRUCTIONS:  BG monitoring instructions: Patient is instructed to check her blood sugars 3-4 times a day, before meals and bedtime .  Call [facility] if: BG persistently < 70 or > 300. . I reviewed the Rule of 15 for the treatment of hypoglycemia in detail with the patient. Literature supplied.    F/U in 3 months    Signed electronically by: Mack Guise, MD  [facility] End[facility]in[facility]  [address] [place], [place] [address] Phone: [phone] FAX: [phone]   CC: Isaac Bliss, Rayford Halsted, MD [place] [address] Phone: [phone]  Fax: [phone]  Return to Endocrinology clinic as below: Future Appointments  Date Time Provider [place]  09/16/2019  3:45 PM [place] None

## 2019-07-03 ENCOUNTER — Encounter: Payer: Self-pay | Admitting: Internal Medicine

## 2019-07-05 ENCOUNTER — Telehealth: Payer: Self-pay | Admitting: Internal Medicine

## 2019-07-05 NOTE — Telephone Encounter (Signed)
Pt advised that [employer] was sending paperwork for the Pt to get a Three wheel walker through [facility]. Pt wanted to see if Dr. Jerilee Hoh would put it in for her to get the walker due to furniture and space in home she needs the three wheel walker/ please advise

## 2019-07-13 ENCOUNTER — Telehealth: Payer: Self-pay

## 2019-07-13 DIAGNOSIS — G8929 Other chronic pain: Secondary | ICD-10-CM

## 2019-07-13 DIAGNOSIS — Z9181 History of falling: Secondary | ICD-10-CM

## 2019-07-13 DIAGNOSIS — G40209 Localization-related (focal) (partial) symptomatic epilepsy and epileptic syndromes with complex partial seizures, not intractable, without status epilepticus: Secondary | ICD-10-CM

## 2019-07-13 NOTE — Telephone Encounter (Signed)
Copied from [place] [id number]. Topic: General - Other >> Jul 13, 2019 12:20 PM [place] A wrote: Patient would like provider to send over authrorization to [facility] for her three wheel walker and bathtub ramp. Patient would like a callback

## 2019-07-13 NOTE — Telephone Encounter (Signed)
Okay to send 

## 2019-07-13 NOTE — Telephone Encounter (Signed)
Paperwork not yet received.

## 2019-07-14 NOTE — Telephone Encounter (Signed)
Yes

## 2019-07-14 NOTE — Telephone Encounter (Signed)
Order placed

## 2019-07-15 NOTE — Telephone Encounter (Signed)
Copied from [place] [id number]. Topic: General - Other >> Jul 15, 2019  4:28 PM Yvette Rack wrote: Reason for CRM: Pt returned call to office. Pt requests call back.

## 2019-07-15 NOTE — Telephone Encounter (Signed)
Left message on machine for patient to return our call 

## 2019-07-18 NOTE — Telephone Encounter (Signed)
Patient is calling to state that [name] did not receive the orders. Patient is requesting if the orders can be faxed. Please advise [phone] [facility] has changed names to [facility].

## 2019-07-19 NOTE — Telephone Encounter (Signed)
Insurance will not cover through [place]. Will try [facility]. Patient is aware

## 2019-07-19 NOTE — Telephone Encounter (Signed)
Patient called back to ask about orders that are to be sent to [facility] for her three wheeled walker. Please call patient at Ph# [phone]

## 2019-07-20 NOTE — Telephone Encounter (Signed)
Patient called to check the status of the order for her 3 wheel walker. Please give her a call back at Ph#  [phone]

## 2019-07-22 NOTE — Telephone Encounter (Signed)
Orders faxed to [facility].  Information given to husband per DPR.

## 2019-07-22 NOTE — Telephone Encounter (Signed)
Pt stated she just spoke with [facility] and they told pt they did not receive anything. Fax# [phone]. Pt would like to know if she can have another call once it's been sent in. Please advise.

## 2019-07-22 NOTE — Telephone Encounter (Signed)
Pt called back to report that [facility] does not accept insurance, however just spoke with [facility] will accept, prescription needed. Pt confirmed that insurance covers this. Please advise  Best contact: [phone]  VM available

## 2019-07-26 NOTE — Telephone Encounter (Signed)
See phone note

## 2019-07-29 ENCOUNTER — Encounter: Payer: Self-pay | Admitting: Internal Medicine

## 2019-07-29 ENCOUNTER — Ambulatory Visit: Payer: Medicare HMO | Admitting: Internal Medicine

## 2019-07-29 ENCOUNTER — Ambulatory Visit (INDEPENDENT_AMBULATORY_CARE_PROVIDER_SITE_OTHER): Payer: Medicare HMO | Admitting: Internal Medicine

## 2019-07-29 ENCOUNTER — Other Ambulatory Visit: Payer: Self-pay

## 2019-07-29 VITALS — BP 130/80 | HR 87 | Temp 98.9°F | Wt 183.1 lb

## 2019-07-29 DIAGNOSIS — D696 Thrombocytopenia, unspecified: Secondary | ICD-10-CM | POA: Diagnosis not present

## 2019-07-29 DIAGNOSIS — G8929 Other chronic pain: Secondary | ICD-10-CM | POA: Diagnosis not present

## 2019-07-29 DIAGNOSIS — K909 Intestinal malabsorption, unspecified: Secondary | ICD-10-CM

## 2019-07-29 DIAGNOSIS — E1122 Type 2 diabetes mellitus with diabetic chronic kidney disease: Secondary | ICD-10-CM | POA: Diagnosis not present

## 2019-07-29 DIAGNOSIS — J9601 Acute respiratory failure with hypoxia: Secondary | ICD-10-CM | POA: Diagnosis not present

## 2019-07-29 DIAGNOSIS — I480 Paroxysmal atrial fibrillation: Secondary | ICD-10-CM | POA: Diagnosis not present

## 2019-07-29 DIAGNOSIS — Z1231 Encounter for screening mammogram for malignant neoplasm of breast: Secondary | ICD-10-CM

## 2019-07-29 DIAGNOSIS — E538 Deficiency of other specified B group vitamins: Secondary | ICD-10-CM | POA: Diagnosis not present

## 2019-07-29 DIAGNOSIS — M549 Dorsalgia, unspecified: Secondary | ICD-10-CM | POA: Diagnosis not present

## 2019-07-29 DIAGNOSIS — R197 Diarrhea, unspecified: Secondary | ICD-10-CM

## 2019-07-29 DIAGNOSIS — N6320 Unspecified lump in the left breast, unspecified quadrant: Secondary | ICD-10-CM | POA: Diagnosis not present

## 2019-07-29 DIAGNOSIS — D649 Anemia, unspecified: Secondary | ICD-10-CM | POA: Diagnosis not present

## 2019-07-29 DIAGNOSIS — I2510 Atherosclerotic heart disease of native coronary artery without angina pectoris: Secondary | ICD-10-CM | POA: Diagnosis not present

## 2019-07-29 DIAGNOSIS — N183 Chronic kidney disease, stage 3 (moderate): Secondary | ICD-10-CM | POA: Diagnosis not present

## 2019-07-29 DIAGNOSIS — I129 Hypertensive chronic kidney disease with stage 1 through stage 4 chronic kidney disease, or unspecified chronic kidney disease: Secondary | ICD-10-CM | POA: Diagnosis not present

## 2019-07-29 DIAGNOSIS — C9201 Acute myeloblastic leukemia, in remission: Secondary | ICD-10-CM | POA: Diagnosis not present

## 2019-07-29 DIAGNOSIS — K5900 Constipation, unspecified: Secondary | ICD-10-CM | POA: Diagnosis not present

## 2019-07-29 MED ORDER — HYDROCODONE-ACETAMINOPHEN 5-325 MG PO TABS: 1.0000 | ORAL_TABLET | Freq: Four times a day (QID) | ORAL | 0 refills | Status: DC | PRN

## 2019-07-29 NOTE — Progress Notes (Signed)
Acute Office Visit     CC/Reason for Visit: orange diarrhea, left breast lump, needs medication refills.  HPI: [name] is a 78 y.o. female who is coming in today for the above mentioned reasons.  She has scheduled this visit to discuss several acute issues and for medication refills.  1.  She is due for her 81-monthhydrocodone refills per pain management contract, she has not required escalating doses, does not report any side effects.  2.  About 2 weeks ago she noticed a lump in her left breast, nonpainful.  She has not had a mammogram since 2018.  3.  Since early July she has noticed a change in her stools.  She states that they are orange in color and the toilet bowl looks greasy.  She is having 4-5 episodes a day.  She has not tried any over-the-counter diet pills, denies different diet.  She has also had severe fecal incontinence and the stool does not wash out easily from her underwear, she has resorted to wearing diapers.  She denies weight loss.  She has had pancreatitis in the past.   Past Medical/Surgical History: Past Medical History:  Diagnosis Date  . acute myeloblastic leukemia (AML) dx'd 06/2014  . Acute pancreatitis   . Arthritis    "back" (09/17/2017)  . Atrial flutter ([facility]   . Cardiomyopathy ([facility]   . Chronic kidney disease, stage II (mild)   . Chronic lower back pain   . Colon polyps 04/29/2010   TUBULAR ADENOMA AND A SERRATED ADENOMA  . COPD (chronic obstructive pulmonary disease) ([facility]   . CORONARY ARTERY DISEASE 12/24/2007  . DIABETES MELLITUS, TYPE II 07/13/2007  . History of blood transfusion 2015   "related to leukemia"  . History of kidney stones 12/24/2007  . HYPERLIPIDEMIA 12/24/2007  . HYPERTENSION 07/13/2007  . NSTEMI (non-ST elevated myocardial infarction) ([facility] 09/17/2017  . Unspecified disease of pancreas   . Uterine cancer ([facility]     Past Surgical History:  Procedure Laterality Date  . ABDOMINAL HYSTERECTOMY     "still have my  ovaries"  . CARDIAC CATHETERIZATION  2002  . CARDIAC CATHETERIZATION  09/17/2017  . CATARACT EXTRACTION W/ INTRAOCULAR LENS  IMPLANT, BILATERAL Bilateral   . COLONOSCOPY W/ BIOPSIES AND POLYPECTOMY  2011  . CORONARY STENT INTERVENTION N/A 09/21/2017   Procedure: CORONARY STENT INTERVENTION;  Surgeon: KTroy Sine MD;  [facility];  Service: Cardiovascular;  Laterality: N/A;  . DILATION AND CURETTAGE OF UTERUS    . EXCISIONAL HEMORRHOIDECTOMY  X 2  . GANGLION CYST EXCISION Left X 3  . INTRAVASCULAR PRESSURE WIRE/FFR STUDY N/A 09/17/2017   Procedure: INTRAVASCULAR PRESSURE WIRE/FFR STUDY;  Surgeon: ENelva Bush MD;  [place] [facility];  Service: Cardiovascular;  Laterality: N/A;  . NASAL SINUS SURGERY    . PORTA CATH INSERTION Right 2013  . RIGHT/LEFT HEART CATH AND CORONARY ANGIOGRAPHY N/A 09/17/2017   Procedure: RIGHT/LEFT HEART CATH AND CORONARY ANGIOGRAPHY;  Surgeon: ENelva Bush MD;  [facility];  Service: Cardiovascular;  Laterality: N/A;  . TUBAL LIGATION      Social History:  reports that she quit smoking about 18 years ago. Her smoking use included cigarettes. She has a 20.00 pack-year smoking history. She has never used smokeless tobacco. She reports that she does not drink alcohol or use drugs.  Allergies: No Known Allergies  Family History:  Family History  Problem Relation Age of Onset  .  COPD Father   . Throat cancer Father   . Uterine cancer Sister        uterine ca and breast ca  . Breast cancer Sister   . Throat cancer Brother   . Suicidality Brother   . Congestive Heart Failure Mother   . Heart attack Maternal Grandmother   . Colon cancer Neg Hx      Current Outpatient Medications:  .  amLODipine (NORVASC) 5 MG tablet, TAKE 1 TABLET EVERY DAY, Disp: 90 tablet, Rfl: 1 .  aspirin EC 81 MG tablet, Take 1 tablet (81 mg total) by mouth daily., Disp: , Rfl:  .  atorvastatin (LIPITOR) 40 MG tablet, TAKE 1  TABLET EVERY DAY, Disp: 90 tablet, Rfl: 1 .  BD VEO INSULIN SYRINGE U/F 31G X 15/64" 0.3 ML MISC, USE THREE TIMES DAILY, Disp: 100 each, Rfl: 1 .  Blood Glucose Monitoring Suppl (TRUE METRIX GO GLUCOSE METER) w/Device KIT, 30 Units by Does not apply route daily., Disp: 1 kit, Rfl: 0 .  clopidogrel (PLAVIX) 75 MG tablet, Take 1 tablet (75 mg total) by mouth every evening., Disp: 90 tablet, Rfl: 1 .  cyanocobalamin (,VITAMIN B-12,) 1000 MCG/ML injection, ADMINISTER 1 ML(1000 MCG) IN THE MUSCLE 1 TIME FOR 1 DOSE, Disp: 10 mL, Rfl: 10 .  fluticasone furoate-vilanterol (BREO ELLIPTA) 200-25 MCG/INH AEPB, Inhale 1 puff into the lungs daily., Disp: 120 each, Rfl: 5 .  furosemide (LASIX) 40 MG tablet, Take 1 tablet (40 mg total) by mouth daily as needed for edema., Disp: 90 tablet, Rfl: 1 .  glucose blood (TRUE METRIX BLOOD GLUCOSE TEST) test strip, Use to test glucose 3 times daily. Dx e11.9  Z79.4 - [employer], Disp: 300 each, Rfl: 12 .  HYDROcodone-acetaminophen (NORCO/VICODIN) 5-325 MG tablet, Take 1 tablet by mouth every 6 (six) hours as needed for moderate pain., Disp: 120 tablet, Rfl: 0 .  Insulin Glargine, 2 Unit Dial, (TOUJEO MAX SOLOSTAR) 300 UNIT/ML SOPN, Inject 18 Units into the skin at bedtime., Disp: 18 mL, Rfl: 3 .  Insulin Pen Needle (PEN NEEDLES 3/16") 31G X 5 MM MISC, Use with Novolin R, Disp: 100 each, Rfl: 0 .  insulin regular (NOVOLIN R) 100 units/mL injection, Inject 0.12 mLs (12 Units total) into the skin 3 (three) times daily before meals. INJECT 10 UNITS SUBCUTANEOUSLY 3 TIMES DAILY. INJECT 4 EXTRA UNITS IF CBG OVER 200. INJECT 6 EXTRA UNITS IF CBG OVER 300, Disp: 40 mL, Rfl: 3 .  isosorbide mononitrate (IMDUR) 30 MG 24 hr tablet, Take 1.5 tablets (45 mg total) by mouth daily., Disp: 135 tablet, Rfl: 3 .  LORazepam (ATIVAN) 0.5 MG tablet, TAKE 1 TABLET BY MOUTH EVERY 8 HOURS AS NEEDED FOR ANXIETY(DO NOT EXCEED MORE THAN 2 TABS IN 24 HRS), Disp: 60 tablet, Rfl: 2 .  MAGNESIUM OXIDE 400 PO,  Take 400 mg by mouth 2 (two) times daily., Disp: , Rfl:  .  metoprolol succinate (TOPROL XL) 100 MG 24 hr tablet, Take 1 tablet (100 mg total) by mouth daily. Take with or immediately following a meal., Disp: 90 tablet, Rfl: 1 .  nitroGLYCERIN (NITROSTAT) 0.4 MG SL tablet, Place 1 tablet (0.4 mg total) under the tongue every 5 (five) minutes as needed for chest pain., Disp: 24 tablet, Rfl: 1 .  polyethylene glycol (MIRALAX / GLYCOLAX) 17 g packet, Take 17 g by mouth as needed., Disp: , Rfl:  .  potassium chloride (K-DUR) 10 MEQ tablet, Take 1 tablet (10 mEq total) by mouth  daily., Disp: 90 tablet, Rfl: 3 .  promethazine (PHENERGAN) 12.5 MG tablet, Take 2 tablets (25 mg total) by mouth every 6 (six) hours as needed for nausea., Disp: 12 tablet, Rfl: 0 .  SYRINGE/NEEDLE, DISP, 1 ML (BD ECLIPSE SYRINGE) 25G X 5/8" 1 ML MISC, Use for b12 injections, Disp: 100 each, Rfl: 3 .  HYDROcodone-acetaminophen (NORCO/VICODIN) 5-325 MG tablet, Take 1 tablet by mouth every 6 (six) hours as needed for moderate pain., Disp: 120 tablet, Rfl: 0 .  HYDROcodone-acetaminophen (NORCO/VICODIN) 5-325 MG tablet, Take 1 tablet by mouth every 6 (six) hours as needed for moderate pain., Disp: 120 tablet, Rfl: 0 No current facility-administered medications for this visit.   Facility-Administered Medications Ordered in Other Visits:  .  sodium chloride 0.9 % 1,000 mL with magnesium sulfate 2 g infusion, , Intravenous, Continuous, Ladell Pier, MD, Stopped at 08/04/14 1441  Review of Systems:  Constitutional: Denies fever, chills, diaphoresis, appetite change and fatigue.  HEENT: Denies photophobia, eye pain, redness, hearing loss, ear pain, congestion, sore throat, rhinorrhea, sneezing, mouth sores, trouble swallowing, neck pain, neck stiffness and tinnitus.   Respiratory: Denies SOB, DOE, cough, chest tightness,  and wheezing.   Cardiovascular: Denies chest pain, palpitations and leg swelling.  Gastrointestinal: Denies  nausea, vomiting, abdominal pain, constipation, blood in stool and abdominal distention.  Genitourinary: Denies dysuria, urgency, frequency, hematuria, flank pain and difficulty urinating.  Endocrine: Denies: hot or cold intolerance, sweats, changes in hair or nails, polyuria, polydipsia. Musculoskeletal: Denies myalgias, back pain, joint swelling, arthralgias and gait problem.  Skin: Denies pallor, rash and wound.  Neurological: Denies dizziness, seizures, syncope, weakness, light-headedness, numbness and headaches.  Hematological: Denies adenopathy. Easy bruising, personal or family bleeding history  Psychiatric/Behavioral: Denies suicidal ideation, mood changes, confusion, nervousness, sleep disturbance and agitation    Physical Exam: Vitals:   07/29/19 1346  BP: 130/80  Pulse: 87  Temp: 98.9 F (37.2 C)  TempSrc: Temporal  SpO2: 98%  Weight: 183 lb 1.6 oz (83.1 kg)    Body mass index is 33.49 kg/m.   Constitutional: NAD, calm, comfortable, obese Eyes: PERRL, lids and conjunctivae normal, wears corrective lenses ENMT: Mucous membranes are moist.  Respiratory: clear to auscultation bilaterally, no wheezing, no crackles. Normal respiratory effort. No accessory muscle use.  Cardiovascular: Regular rate and rhythm, no murmurs / rubs / gallops. No extremity edema. 2+ pedal pulses.  Abdomen: no tenderness, no masses palpated. No hepatosplenomegaly. Bowel sounds positive.  Musculoskeletal: no clubbing / cyanosis. No joint deformity upper and lower extremities. Good ROM, no contractures. Normal muscle tone.  Skin: I am unable to identify any lumps in her left breast, axillary area. Neurologic: CN 2-12 grossly intact. Sensation intact, DTR normal. Strength 5/5 in all 4.  Psychiatric: Normal judgment and insight. Alert and oriented x 3. Normal mood.    Impression and Plan:  Left breast lump -I am unable to palpate any lumps, however she is overdue for mammogram anyway so we will  schedule.  Diarrhea due to malabsorption -Sounds like an increase in fecal fat causing the diarrhea. -She denies use of over-the-counter orlistat, no dietary changes. -She does have a history of pancreatitis so wonder if she may be developing pancreatic insufficiency. -She sees GI, Dr. Henrene Pastor, will refer to him for evaluation.  Chronic bilateral back pain, unspecified back location -NCCSRS reviewed in EPIC   Indication for chronic opioid:  Chronic back, neck and hip pain Medication and dose:  Hydrocodone 5/325 mg every 6 hours as needed #  pills per month:  120 Last UDS date:  Opioid Treatment Agreement signed:  Yes Opioid Treatment Agreement last reviewed with patient:  10/27/2018 NCCSRS reviewed this encounter (include red flags):   Yes, no red flags, overdose risk score is 160, I am sole prescriber.    Patient Instructions  -Nice seeing you today!!  -Will send you for a diagnostic mammogram for your left breast lump.  -Will send GI referral to investigate your diarrhea.  -Schedule follow up in 4 months.     Lelon Frohlich, MD [facility]

## 2019-07-29 NOTE — Addendum Note (Signed)
Addended by: Westley Hummer B on: 07/29/2019 02:50 PM   Modules accepted: Orders

## 2019-07-29 NOTE — Telephone Encounter (Signed)
Information given to patient at office visit 07/29/2019.  Orders faxed and confirmed.

## 2019-07-29 NOTE — Patient Instructions (Signed)
-  Nice seeing you today!!  -Will send you for a diagnostic mammogram for your left breast lump.  -Will send GI referral to investigate your diarrhea.  -Schedule follow up in 4 months.

## 2019-08-02 ENCOUNTER — Telehealth: Payer: Self-pay | Admitting: *Deleted

## 2019-08-02 NOTE — Telephone Encounter (Signed)
Copied from [place]. Topic: General - Other >> Aug 02, 2019  9:17 AM Rainey Pines A wrote: Patient called to inform provider that she is faxing over the form for her 3 wheel walker and would like a callback once fax is received.

## 2019-08-05 NOTE — Telephone Encounter (Signed)
Form faxed

## 2019-08-15 ENCOUNTER — Other Ambulatory Visit: Payer: Self-pay | Admitting: Internal Medicine

## 2019-08-15 DIAGNOSIS — I10 Essential (primary) hypertension: Secondary | ICD-10-CM

## 2019-08-15 DIAGNOSIS — I2510 Atherosclerotic heart disease of native coronary artery without angina pectoris: Secondary | ICD-10-CM

## 2019-08-26 ENCOUNTER — Other Ambulatory Visit: Payer: Self-pay | Admitting: *Deleted

## 2019-08-26 ENCOUNTER — Telehealth: Payer: Self-pay | Admitting: Oncology

## 2019-08-26 ENCOUNTER — Encounter: Payer: Self-pay | Admitting: *Deleted

## 2019-08-26 DIAGNOSIS — C9501 Acute leukemia of unspecified cell type, in remission: Secondary | ICD-10-CM

## 2019-08-26 NOTE — Telephone Encounter (Signed)
Scheduled appt per 9/18 sch message - pt aware of appt date and time

## 2019-08-26 NOTE — Progress Notes (Signed)
Per Dr. [name]: Schedule for OV and lab with next port flush due in November. Scheduling message sent.

## 2019-09-07 ENCOUNTER — Encounter: Payer: Self-pay | Admitting: Physician Assistant

## 2019-09-07 ENCOUNTER — Telehealth (INDEPENDENT_AMBULATORY_CARE_PROVIDER_SITE_OTHER): Payer: Medicare HMO | Admitting: Physician Assistant

## 2019-09-07 ENCOUNTER — Other Ambulatory Visit: Payer: Self-pay

## 2019-09-07 VITALS — Ht 62.0 in | Wt 174.0 lb

## 2019-09-07 DIAGNOSIS — I5042 Chronic combined systolic (congestive) and diastolic (congestive) heart failure: Secondary | ICD-10-CM

## 2019-09-07 DIAGNOSIS — N183 Chronic kidney disease, stage 3 unspecified: Secondary | ICD-10-CM

## 2019-09-07 DIAGNOSIS — I25118 Atherosclerotic heart disease of native coronary artery with other forms of angina pectoris: Secondary | ICD-10-CM

## 2019-09-07 DIAGNOSIS — I2510 Atherosclerotic heart disease of native coronary artery without angina pectoris: Secondary | ICD-10-CM

## 2019-09-07 DIAGNOSIS — I10 Essential (primary) hypertension: Secondary | ICD-10-CM

## 2019-09-07 DIAGNOSIS — E785 Hyperlipidemia, unspecified: Secondary | ICD-10-CM

## 2019-09-07 DIAGNOSIS — M7989 Other specified soft tissue disorders: Secondary | ICD-10-CM

## 2019-09-07 MED ORDER — FUROSEMIDE 40 MG PO TABS: 60.0000 mg | ORAL_TABLET | Freq: Every day | ORAL | 3 refills | Status: DC

## 2019-09-07 NOTE — Progress Notes (Signed)
Virtual Visit via Video Note   This visit type was conducted due to national recommendations for restrictions regarding the COVID-19 Pandemic (e.g. social distancing) in an effort to limit this patient's exposure and mitigate transmission in our community.  Due to her co-morbid illnesses, this patient is at least at moderate risk for complications without adequate follow up.  This format is felt to be most appropriate for this patient at this time.  All issues noted in this document were discussed and addressed.  A limited physical exam was performed with this format.  Please refer to the patient's chart for her consent to telehealth for [facility].   Date:  09/07/2019   ID:  MICHAEL VENTRESCA, DOB 03/26/1941, MRN [id number]  Patient Location: Other:  Work Provider Location: Office  PCP:  Isaac Bliss, Rayford Halsted, MD  Cardiologist:  Mertie Moores, MD   Electrophysiologist:  None   Evaluation Performed:  Follow-Up Visit  Chief Complaint: CAD, CHF  History of Present Illness:    [name] is a 78 y.o. female with:  Coronary artery disease  S/p NSTEMI 08/2017 in setting of acute exacerbation of CHF   Initial med Rx due to chronic kidney disease   Ultimately underwent PCI w/ DES to pRCA; LAD not amenable to PCI (tx medically)  Acute Myelogenous Leukemia (remission since 2015)  Chronic kidney disease 4  Chronic anemia  COPD  Hx of Atrial fibrillation (anticoagulation deferred 2/2 bleeding risk)  Combined systolic and diastolic CHF  EF 84-16 (05/629)  Seizure d/o  Carotid US 12/18: bilat ICA 1-39  She was last seen by Dr. Acie Fredrickson in May 2020 via telemedicine.  Today, she is seen while she is at work.  She is an Therapist, music.  She has not had chest discomfort, syncope, orthopnea, paroxysmal nocturnal dyspnea.  She does have shortness of breath with certain activities.  Overall, her breathing is stable.  She describes NYHA 2-2b symptoms.  She continues to note lower  extremity swelling.  It is much better in the mornings after awakening.  It gets worse throughout the day.  She is seated throughout most of the day at her job.  The patient does not have symptoms concerning for COVID-19 infection (fever, chills, cough, or new shortness of breath).    Past Medical History:  Diagnosis Date  . acute myeloblastic leukemia (AML) dx'd 06/2014  . Acute pancreatitis   . Arthritis    "back" (09/17/2017)  . Atrial flutter ([place])   . Cardiomyopathy ([place])   . Chronic kidney disease, stage II (mild)   . Chronic lower back pain   . Colon polyps 04/29/2010   TUBULAR ADENOMA AND A SERRATED ADENOMA  . COPD (chronic obstructive pulmonary disease) ([place])   . CORONARY ARTERY DISEASE 12/24/2007  . DIABETES MELLITUS, TYPE II 07/13/2007  . History of blood transfusion 2015   "related to leukemia"  . History of kidney stones 12/24/2007  . HYPERLIPIDEMIA 12/24/2007  . HYPERTENSION 07/13/2007  . NSTEMI (non-ST elevated myocardial infarction) ([place]) 09/17/2017  . Unspecified disease of pancreas   . Uterine cancer [facility])    Past Surgical History:  Procedure Laterality Date  . ABDOMINAL HYSTERECTOMY     "still have my ovaries"  . CARDIAC CATHETERIZATION  2002  . CARDIAC CATHETERIZATION  09/17/2017  . CATARACT EXTRACTION W/ INTRAOCULAR LENS  IMPLANT, BILATERAL Bilateral   . COLONOSCOPY W/ BIOPSIES AND POLYPECTOMY  2011  . CORONARY STENT INTERVENTION N/A 09/21/2017   Procedure: CORONARY STENT INTERVENTION;  Surgeon: Troy Sine, MD;  Location: [place] [facility];  Service: Cardiovascular;  Laterality: N/A;  . DILATION AND CURETTAGE OF UTERUS    . EXCISIONAL HEMORRHOIDECTOMY  X 2  . GANGLION CYST EXCISION Left X 3  . INTRAVASCULAR PRESSURE WIRE/FFR STUDY N/A 09/17/2017   Procedure: INTRAVASCULAR PRESSURE WIRE/FFR STUDY;  Surgeon: Nelva Bush, MD;  Location: [facility];  Service: Cardiovascular;  Laterality: N/A;  . NASAL SINUS SURGERY    . PORTA CATH INSERTION  Right 2013  . RIGHT/LEFT HEART CATH AND CORONARY ANGIOGRAPHY N/A 09/17/2017   Procedure: RIGHT/LEFT HEART CATH AND CORONARY ANGIOGRAPHY;  Surgeon: Nelva Bush, MD;  Location: [place] [facility];  Service: Cardiovascular;  Laterality: N/A;  . TUBAL LIGATION       Current Meds  Medication Sig  . amLODipine (NORVASC) 5 MG tablet TAKE 1 TABLET EVERY DAY  . aspirin EC 81 MG tablet Take 1 tablet (81 mg total) by mouth daily.  Marland Kitchen atorvastatin (LIPITOR) 40 MG tablet TAKE 1 TABLET EVERY DAY  . BD VEO INSULIN SYRINGE U/F 31G X 15/64" 0.3 ML MISC USE THREE TIMES DAILY  . Blood Glucose Monitoring Suppl (TRUE METRIX GO GLUCOSE METER) w/Device KIT 30 Units by Does not apply route daily.  . clopidogrel (PLAVIX) 75 MG tablet TAKE 1 TABLET EVERY EVENING  . cyanocobalamin (,VITAMIN B-12,) 1000 MCG/ML injection ADMINISTER 1 ML(1000 MCG) IN THE MUSCLE 1 TIME FOR 1 DOSE  . fluticasone furoate-vilanterol (BREO ELLIPTA) 200-25 MCG/INH AEPB Inhale 1 puff into the lungs daily.  . furosemide (LASIX) 40 MG tablet Take 1.5 tablets (60 mg total) by mouth daily.  Marland Kitchen glucose blood (TRUE METRIX BLOOD GLUCOSE TEST) test strip Use to test glucose 3 times daily. Dx e11.9  Z79.4 - [employer]  . HYDROcodone-acetaminophen (NORCO/VICODIN) 5-325 MG tablet Take 1 tablet by mouth every 6 (six) hours as needed for moderate pain.  Marland Kitchen HYDROcodone-acetaminophen (NORCO/VICODIN) 5-325 MG tablet Take 1 tablet by mouth every 6 (six) hours as needed for moderate pain.  Marland Kitchen HYDROcodone-acetaminophen (NORCO/VICODIN) 5-325 MG tablet Take 1 tablet by mouth every 6 (six) hours as needed for moderate pain.  . Insulin Glargine, 2 Unit Dial, (TOUJEO MAX SOLOSTAR) 300 UNIT/ML SOPN Inject 18 Units into the skin at bedtime.  . Insulin Pen Needle (PEN NEEDLES 3/16") 31G X 5 MM MISC Use with Novolin R  . insulin regular (NOVOLIN R) 100 units/mL injection Inject 0.12 mLs (12 Units total) into the skin 3 (three) times daily before meals. INJECT 10 UNITS  SUBCUTANEOUSLY 3 TIMES DAILY. INJECT 4 EXTRA UNITS IF CBG OVER 200. INJECT 6 EXTRA UNITS IF CBG OVER 300  . isosorbide mononitrate (IMDUR) 30 MG 24 hr tablet Take 1.5 tablets (45 mg total) by mouth daily.  Marland Kitchen LORazepam (ATIVAN) 0.5 MG tablet TAKE 1 TABLET BY MOUTH EVERY 8 HOURS AS NEEDED FOR ANXIETY(DO NOT EXCEED MORE THAN 2 TABS IN 24 HRS)  . MAGNESIUM OXIDE 400 PO Take 400 mg by mouth 2 (two) times daily.  . metoprolol succinate (TOPROL-XL) 100 MG 24 hr tablet TAKE 1 TABLET EVERY DAY  WITH  OR  IMMEDIATELY FOLLOWING A MEAL  . nitroGLYCERIN (NITROSTAT) 0.4 MG SL tablet Place 1 tablet (0.4 mg total) under the tongue every 5 (five) minutes as needed for chest pain.  . polyethylene glycol (MIRALAX / GLYCOLAX) 17 g packet Take 17 g by mouth as needed.  . potassium chloride (K-DUR) 10 MEQ tablet Take 1 tablet (10 mEq total) by mouth daily.  Marland Kitchen  promethazine (PHENERGAN) 12.5 MG tablet Take 2 tablets (25 mg total) by mouth every 6 (six) hours as needed for nausea.  Marland Kitchen SYRINGE/NEEDLE, DISP, 1 ML (BD ECLIPSE SYRINGE) 25G X 5/8" 1 ML MISC Use for b12 injections  . [DISCONTINUED] furosemide (LASIX) 40 MG tablet Take 1 tablet (40 mg total) by mouth daily as needed for edema.     Allergies:   Patient has no known allergies.   Social History   Tobacco Use  . Smoking status: Former Smoker    Packs/day: 1.00    Years: 20.00    Pack years: 20.00    Types: Cigarettes    Quit date: 12/08/2000    Years since quitting: 18.7  . Smokeless tobacco: Never Used  Substance Use Topics  . Alcohol use: No    Alcohol/week: 0.0 standard drinks  . Drug use: No     Family Hx: The patient's family history includes Breast cancer in her sister; COPD in her father; Congestive Heart Failure in her mother; Heart attack in her maternal grandmother; Suicidality in her brother; Throat cancer in her brother and father; Uterine cancer in her sister. There is no history of Colon cancer.  ROS:   Please see the history of present  illness.    No fever, no cough.  No hematochezia. All other systems reviewed and are negative.   Prior CV studies:   The following studies were reviewed today:  Carotid US 11/23/17 Final Interpretation: Right Carotid: There is evidence in the right ICA of a 1-39% stenosis. Left Carotid: There is evidence in the left ICA of a 1-39% stenosis.  Cardiac catheterization/PCI 09/21/17 2.5 x 23 mm [place] DES to the proximal-mid RCA  R/L cardiac catheterization 09/17/17 LM ostial 40, 50 LAD proximal 60, mid 50 LCx mid 40 RCA proximal 80 LVEDP 20,mean RA 10, RV 40/12, PA 40/15 (mean 23), PCWP 20  Echocardiogram 08/24/17 Moderate concentric LVH, EF 40-45, mid apical hypokinesis, apical akinesis, trivial MR/TR, PASP 51  Echocardiogram 08/27/16 EF 73-41, grade 1 diastolic dysfunction, mild aortic stenosis (mean 13), normal RV SF  Carotid US 05/24/14 Bilateral ICA 1-39  Nuclear stress test 02/05/14 IMPRESSION: 1. No scintigraphic evidence of prior infarction or pharmacologically induced ischemia. 2. Normal wall motion. Ejection fraction is 63%.  Labs/Other Tests and Data Reviewed:    EKG:  No ECG reviewed.  Recent Labs: 03/29/2019: ALT 21; Hemoglobin 11.8; Platelet Count 121 05/19/2019: BUN 22; Creatinine, Ser 1.38; Potassium 3.9; Sodium 141   Recent Lipid Panel Lab Results  Component Value Date/Time   CHOL 130 02/17/2018 10:05 AM   TRIG 161 (H) 02/17/2018 10:05 AM   HDL 38 (L) 02/17/2018 10:05 AM   CHOLHDL 3.4 02/17/2018 10:05 AM   CHOLHDL 4.5 11/22/2017 03:54 AM   LDLCALC 60 02/17/2018 10:05 AM    Wt Readings from Last 3 Encounters:  09/07/19 174 lb (78.9 kg)  07/29/19 183 lb 1.6 oz (83.1 kg)  07/01/19 185 lb (83.9 kg)     Objective:    Vital Signs:  Ht '5\' 2"'$  (1.575 m)   Wt 174 lb (78.9 kg)   BMI 31.83 kg/m    VITAL SIGNS:  reviewed GEN:  no acute distress EYES:  sclerae anicteric, EOMI - Extraocular Movements Intact RESPIRATORY:  Normal respiratory  effort CARDIOVASCULAR:  lower extremity edema noted SKIN:  No lower extremity erythema NEURO:  alert and oriented x 3, no obvious focal deficit PSYCH:  normal affect  ASSESSMENT & PLAN:    1. Chronic combined  systolic and diastolic heart failure (HCC) 2. Leg swelling EF 40-45 by echocardiogram in September 2018.  PASP at that time was 51.  She does have a history of COPD.  I suspect her lower extremity swelling is multifactorial.  As it does improve with leg elevation, I suspect venous insufficiency is the largest component.  She does have a diet rich in salt.  I have recommended that she reduce salt in her diet, wear compression stockings and keep her legs elevated.  I will adjust her Lasix to 60 mg daily to see if this provides any improvement.  Obtain follow-up CMET in 1-2 weeks.  Follow-up with Dr. Acie Fredrickson or me, in person in 6 months.  3. Coronary artery disease involving native coronary artery of native heart with angina pectoris History of myocardial infarction in September 2018 treated with a drug-eluting stent to the RCA.  She did have some moderate disease in LAD that was not amenable to PCI.  She is doing well without anginal symptoms.  Continue aspirin, atorvastatin, clopidogrel, nitrates, metoprolol succinate, amlodipine.  4. CKD (chronic kidney disease) stage 3 Most recent creatinine 1.38.  Repeat CMET after increasing Lasix.  5. Essential hypertension She was unable to obtain a blood pressure today.  Continue current therapy.  6. Hyperlipidemia, unspecified hyperlipidemia type Continue high-dose atorvastatin.  Arrange follow-up CMET, fasting lipids.   Time:   Today, I have spent 12.5 minutes with the patient with telehealth technology discussing the above problems.     Medication Adjustments/Labs and Tests Ordered: Current medicines are reviewed at length with the patient today.  Concerns regarding medicines are outlined above.   Tests Ordered: Orders Placed This  Encounter  Procedures  . Lipid panel  . Comprehensive metabolic panel    Medication Changes: Meds ordered this encounter  Medications  . furosemide (LASIX) 40 MG tablet    Sig: Take 1.5 tablets (60 mg total) by mouth daily.    Dispense:  180 tablet    Refill:  3    Follow Up:  In Person in 6 month(s)  Signed, Richardson Dopp, PA-C  09/07/2019 5:19 PM    [place]

## 2019-09-07 NOTE — Patient Instructions (Addendum)
Medication Instructions:  INCREASE: Lasix to 60 mg once a day   If you need a refill on your cardiac medications before your next appointment, please call your pharmacy.   Lab work: FUTURE: CMET & LIPIDS   If you have labs (blood work) drawn today and your tests are completely normal, you will receive your results only by: Marland Kitchen MyChart Message (if you have MyChart) OR . A paper copy in the mail If you have any lab test that is abnormal or we need to change your treatment, we will call you to review the results.  Testing/Procedures: None   Follow-Up: At [facility], you and your health needs are our priority.  As part of our continuing mission to provide you with exceptional heart care, we have created designated Provider Care Teams.  These Care Teams include your primary Cardiologist (physician) and Advanced Practice Providers (APPs -  Physician Assistants and Nurse Practitioners) who all work together to provide you with the care you need, when you need it. You will need a follow up appointment in:  6 months.  Please call our office 2 months in advance to schedule this appointment.  You may see Mertie Moores, MD or one of the following Advanced Practice Providers on your designated Care Team: Richardson Dopp, PA-C [place], [place] . Daune Perch, NP  Any Other Special Instructions Will Be Listed Below (If Applicable).  Keep legs elevated as much as possible   Wear compression stocking as much as possible      Low-Sodium Eating Plan Sodium, which is an element that makes up salt, helps you maintain a healthy balance of fluids in your body. Too much sodium can increase your blood pressure and cause fluid and waste to be held in your body. Your health care provider or dietitian may recommend following this plan if you have high blood pressure (hypertension), kidney disease, liver disease, or heart failure. Eating less sodium can help lower your blood pressure, reduce swelling, and  protect your heart, liver, and kidneys. What are tips for following this plan? General guidelines Most people on this plan should limit their sodium intake to 1,500-2,000 mg (milligrams) of sodium each day. Reading food labels  The Nutrition Facts label lists the amount of sodium in one serving of the food. If you eat more than one serving, you must multiply the listed amount of sodium by the number of servings. Choose foods with less than 140 mg of sodium per serving. Avoid foods with 300 mg of sodium or more per serving. Shopping Look for lower-sodium products, often labeled as "low-sodium" or "no salt added." Always check the sodium content even if foods are labeled as "unsalted" or "no salt added". Buy fresh foods. Avoid canned foods and premade or frozen meals. Avoid canned, cured, or processed meats Buy breads that have less than 80 mg of sodium per slice. Cooking Eat more home-cooked food and less restaurant, buffet, and fast food. Avoid adding salt when cooking. Use salt-free seasonings or herbs instead of table salt or sea salt. Check with your health care provider or pharmacist before using salt substitutes. Cook with plant-based oils, such as canola, sunflower, or olive oil. Meal planning When eating at a restaurant, ask that your food be prepared with less salt or no salt, if possible. Avoid foods that contain MSG (monosodium glutamate). MSG is sometimes added to [place] food, bouillon, and some canned foods. What foods are recommended? The items listed may not be a complete list. Talk  with your dietitian about what dietary choices are best for you. Grains Low-sodium cereals, including oats, puffed wheat and rice, and shredded wheat. Low-sodium crackers. Unsalted rice. Unsalted pasta. Low-sodium bread. Whole-grain breads and whole-grain pasta. Vegetables Fresh or frozen vegetables. "No salt added" canned vegetables. "No salt added" tomato sauce and paste. Low-sodium or  reduced-sodium tomato and vegetable juice. Fruits Fresh, frozen, or canned fruit. Fruit juice. Meats and other protein foods Fresh or frozen (no salt added) meat, poultry, seafood, and fish. Low-sodium canned tuna and salmon. Unsalted nuts. Dried peas, beans, and lentils without added salt. Unsalted canned beans. Eggs. Unsalted nut butters. Dairy Milk. Soy milk. Cheese that is naturally low in sodium, such as ricotta cheese, fresh mozzarella, or Swiss cheese Low-sodium or reduced-sodium cheese. Cream cheese. Yogurt. Fats and oils Unsalted butter. Unsalted margarine with no trans fat. Vegetable oils such as canola or olive oils. Seasonings and other foods Fresh and dried herbs and spices. Salt-free seasonings. Low-sodium mustard and ketchup. Sodium-free salad dressing. Sodium-free light mayonnaise. Fresh or refrigerated horseradish. Lemon juice. Vinegar. Homemade, reduced-sodium, or low-sodium soups. Unsalted popcorn and pretzels. Low-salt or salt-free chips. What foods are not recommended? The items listed may not be a complete list. Talk with your dietitian about what dietary choices are best for you. Grains Instant hot cereals. Bread stuffing, pancake, and biscuit mixes. Croutons. Seasoned rice or pasta mixes. Noodle soup cups. Boxed or frozen macaroni and cheese. Regular salted crackers. Self-rising flour. Vegetables Sauerkraut, pickled vegetables, and relishes. Olives. [place] fries. Onion rings. Regular canned vegetables (not low-sodium or reduced-sodium). Regular canned tomato sauce and paste (not low-sodium or reduced-sodium). Regular tomato and vegetable juice (not low-sodium or reduced-sodium). Frozen vegetables in sauces. Meats and other protein foods Meat or fish that is salted, canned, smoked, spiced, or pickled. Bacon, ham, sausage, hotdogs, corned beef, chipped beef, packaged lunch meats, salt pork, jerky, pickled herring, anchovies, regular canned tuna, sardines, salted  nuts. Dairy Processed cheese and cheese spreads. Cheese curds. Blue cheese. Feta cheese. String cheese. Regular cottage cheese. Buttermilk. Canned milk. Fats and oils Salted butter. Regular margarine. Ghee. Bacon fat. Seasonings and other foods Onion salt, garlic salt, seasoned salt, table salt, and sea salt. Canned and packaged gravies. Worcestershire sauce. Tartar sauce. Barbecue sauce. Teriyaki sauce. Soy sauce, including reduced-sodium. Steak sauce. Fish sauce. Oyster sauce. Cocktail sauce. Horseradish that you find on the shelf. Regular ketchup and mustard. Meat flavorings and tenderizers. Bouillon cubes. Hot sauce and Tabasco sauce. Premade or packaged marinades. Premade or packaged taco seasonings. Relishes. Regular salad dressings. Salsa. Potato and tortilla chips. Corn chips and puffs. Salted popcorn and pretzels. Canned or dried soups. Pizza. Frozen entrees and pot pies. Summary Eating less sodium can help lower your blood pressure, reduce swelling, and protect your heart, liver, and kidneys. Most people on this plan should limit their sodium intake to 1,500-2,000 mg (milligrams) of sodium each day. Canned, boxed, and frozen foods are high in sodium. Restaurant foods, fast foods, and pizza are also very high in sodium. You also get sodium by adding salt to food. Try to cook at home, eat more fresh fruits and vegetables, and eat less fast food, canned, processed, or prepared foods. This information is not intended to replace advice given to you by your health care provider. Make sure you discuss any questions you have with your health care provider. Document Released: 05/16/2002 Document Revised: 11/06/2017 Document Reviewed: 11/17/2016 Elsevier Patient Education  2020 [employer].

## 2019-09-12 ENCOUNTER — Ambulatory Visit: Payer: Medicare HMO

## 2019-09-13 ENCOUNTER — Ambulatory Visit
Admission: RE | Admit: 2019-09-13 | Discharge: 2019-09-13 | Disposition: A | Discharge status: Home / Self Care | Payer: Medicare HMO | Source: Ambulatory Visit | Attending: Internal Medicine | Admitting: Internal Medicine

## 2019-09-13 ENCOUNTER — Other Ambulatory Visit: Payer: Self-pay | Admitting: Internal Medicine

## 2019-09-13 ENCOUNTER — Other Ambulatory Visit: Payer: Self-pay

## 2019-09-13 DIAGNOSIS — Z1231 Encounter for screening mammogram for malignant neoplasm of breast: Secondary | ICD-10-CM

## 2019-09-13 DIAGNOSIS — N6320 Unspecified lump in the left breast, unspecified quadrant: Secondary | ICD-10-CM

## 2019-09-15 ENCOUNTER — Other Ambulatory Visit: Payer: Self-pay | Admitting: Internal Medicine

## 2019-09-15 DIAGNOSIS — R928 Other abnormal and inconclusive findings on diagnostic imaging of breast: Secondary | ICD-10-CM

## 2019-09-16 ENCOUNTER — Other Ambulatory Visit: Payer: Self-pay

## 2019-09-16 ENCOUNTER — Inpatient Hospital Stay: Payer: Medicare HMO | Attending: Oncology

## 2019-09-16 DIAGNOSIS — Z23 Encounter for immunization: Secondary | ICD-10-CM | POA: Diagnosis not present

## 2019-09-16 DIAGNOSIS — C9201 Acute myeloblastic leukemia, in remission: Secondary | ICD-10-CM | POA: Diagnosis not present

## 2019-09-16 DIAGNOSIS — Z452 Encounter for adjustment and management of vascular access device: Secondary | ICD-10-CM | POA: Insufficient documentation

## 2019-09-16 DIAGNOSIS — Z95828 Presence of other vascular implants and grafts: Secondary | ICD-10-CM

## 2019-09-16 MED ORDER — INFLUENZA VAC A&B SA ADJ QUAD 0.5 ML IM PRSY
0.5000 mL | PREFILLED_SYRINGE | INTRAMUSCULAR | Status: AC
  Administered 2019-09-16: 16:00:00 0.5 mL via INTRAMUSCULAR

## 2019-09-16 MED ORDER — SODIUM CHLORIDE 0.9% FLUSH
10.0000 mL | Freq: Once | INTRAVENOUS | Status: AC
  Administered 2019-09-16: 10 mL via INTRAVENOUS
  Filled 2019-09-16: qty 10

## 2019-09-16 MED ORDER — HEPARIN SOD (PORK) LOCK FLUSH 100 UNIT/ML IV SOLN
500.0000 [IU] | Freq: Once | INTRAVENOUS | Status: AC | PRN
  Administered 2019-09-16: 16:00:00 500 [IU] via INTRAVENOUS
  Filled 2019-09-16: qty 5

## 2019-09-16 MED ORDER — SODIUM CHLORIDE 0.9 % IJ SOLN: 10.0000 mL | INTRAMUSCULAR | Status: DC | PRN

## 2019-09-16 MED ORDER — INFLUENZA VAC A&B SA ADJ QUAD 0.5 ML IM PRSY
PREFILLED_SYRINGE | INTRAMUSCULAR | Status: AC
  Filled 2019-09-16: qty 0.5

## 2019-09-20 ENCOUNTER — Other Ambulatory Visit: Payer: Self-pay

## 2019-09-20 ENCOUNTER — Other Ambulatory Visit: Payer: Self-pay | Admitting: Internal Medicine

## 2019-09-20 ENCOUNTER — Ambulatory Visit
Admission: RE | Admit: 2019-09-20 | Discharge: 2019-09-20 | Disposition: A | Discharge status: Home / Self Care | Payer: Medicare HMO | Source: Ambulatory Visit | Attending: Internal Medicine | Admitting: Internal Medicine

## 2019-09-20 DIAGNOSIS — N6320 Unspecified lump in the left breast, unspecified quadrant: Secondary | ICD-10-CM

## 2019-09-20 DIAGNOSIS — R928 Other abnormal and inconclusive findings on diagnostic imaging of breast: Secondary | ICD-10-CM

## 2019-09-20 DIAGNOSIS — N6321 Unspecified lump in the left breast, upper outer quadrant: Secondary | ICD-10-CM | POA: Diagnosis not present

## 2019-09-22 ENCOUNTER — Ambulatory Visit
Admission: RE | Admit: 2019-09-22 | Discharge: 2019-09-22 | Disposition: A | Discharge status: Home / Self Care | Payer: Medicare HMO | Source: Ambulatory Visit | Attending: Internal Medicine | Admitting: Internal Medicine

## 2019-09-22 ENCOUNTER — Other Ambulatory Visit: Payer: Self-pay

## 2019-09-22 DIAGNOSIS — C50412 Malignant neoplasm of upper-outer quadrant of left female breast: Secondary | ICD-10-CM | POA: Diagnosis not present

## 2019-09-22 DIAGNOSIS — N6320 Unspecified lump in the left breast, unspecified quadrant: Secondary | ICD-10-CM

## 2019-09-22 DIAGNOSIS — N6322 Unspecified lump in the left breast, upper inner quadrant: Secondary | ICD-10-CM | POA: Diagnosis not present

## 2019-09-22 DIAGNOSIS — N6321 Unspecified lump in the left breast, upper outer quadrant: Secondary | ICD-10-CM | POA: Diagnosis not present

## 2019-09-22 DIAGNOSIS — N6323 Unspecified lump in the left breast, lower outer quadrant: Secondary | ICD-10-CM | POA: Diagnosis not present

## 2019-09-22 DIAGNOSIS — Z170 Estrogen receptor positive status [ER+]: Secondary | ICD-10-CM | POA: Diagnosis not present

## 2019-09-22 HISTORY — PX: BREAST BIOPSY: SHX20

## 2019-09-27 DIAGNOSIS — Z170 Estrogen receptor positive status [ER+]: Secondary | ICD-10-CM | POA: Diagnosis not present

## 2019-09-27 DIAGNOSIS — C50412 Malignant neoplasm of upper-outer quadrant of left female breast: Secondary | ICD-10-CM | POA: Diagnosis not present

## 2019-09-28 ENCOUNTER — Telehealth: Payer: Self-pay | Admitting: *Deleted

## 2019-09-28 NOTE — Telephone Encounter (Signed)
Dr. Acie Fredrickson  Can you please comment on holding this patients Plavix? She is scheduled for a lumpectomy (TBD) and the surgical team is asking to hold her Plavix for 5 days prior to procedure.   She has a hx of CAD with significant multivessel coronary artery disease, including sequential 40% ostial and 50% distal LMCA stenoses, sequential proximal and mid LAD stenoses of 50-60%, 40% distal LCx lesion, and 80% proximal to mid RCA stenosis. FFR of the LAD is hemodynamically significant (FFR 0.72) per cath in 2018. She has a staged PCI to RCA and other CAD was to be treated medically.   Please send your recommendations to the pre op pool  Thank you  Sharee Pimple

## 2019-09-28 NOTE — Telephone Encounter (Signed)
   [facility] Pre-operative Risk Assessment    Request for surgical clearance:  1. What type of surgery is being performed? BREAST LUMPECTOMY   2. When is this surgery scheduled? TBD   3. What type of clearance is required (medical clearance vs. Pharmacy clearance to hold med vs. Both)? MEDICAL  4. Are there any medications that need to be held prior to surgery and how long? PLAVIX X 5 DAYS PRIOR   5. Practice name and name of physician performing surgery? [facility]; DR. PAUL TOTH   6. What is your office phone number [phone]    7.   What is your office fax number [phone]  8.   Anesthesia type (None, local, MAC, general) ? GENERAL    [name] 09/28/2019, 11:59 AM  _________________________________________________________________   (provider comments below)

## 2019-09-29 ENCOUNTER — Ambulatory Visit: Payer: Self-pay | Admitting: General Surgery

## 2019-09-29 DIAGNOSIS — C50412 Malignant neoplasm of upper-outer quadrant of left female breast: Secondary | ICD-10-CM

## 2019-09-29 NOTE — Telephone Encounter (Signed)
   Primary Cardiologist: Mertie Moores, MD   [name]. [name] 78 year old female with past medical history of essential hypertension, coronary artery disease, PAF-flutter, chronic combined systolic and diastolic heart failure, TIA, pancreatitis, insulin-dependent diabetes mellitus type 2, CKD stage IV, hyperlipidemia, morbid obesity, and history of NSTEMI.  Chart reviewed as part of pre-operative protocol coverage. Given past medical history and time since last visit, based on ACC/AHA guidelines, [name] would be at acceptable risk for the planned procedure without further cardiovascular testing.   Her RCRI is class IV risk, 11% risk of major cardiac event.  She is able to complete greater than 4 METs of activity.  Her Plavix may be held for 5 days prior to her lumpectomy.  She will need to resume therapy as soon as possible after hemostasis is achieved.  I will route this recommendation to the requesting party via Epic fax function and remove from pre-op pool.  Please call with questions.  [name]. [facility] [address] Office [phone] Fax [phone]

## 2019-09-29 NOTE — Telephone Encounter (Signed)
Pt may hold plavix for 5 days prior to her lumpectomy She is at low risk for procedure

## 2019-10-04 ENCOUNTER — Other Ambulatory Visit: Payer: Self-pay | Admitting: General Surgery

## 2019-10-04 ENCOUNTER — Telehealth: Payer: Self-pay | Admitting: Oncology

## 2019-10-04 ENCOUNTER — Encounter: Payer: Self-pay | Admitting: *Deleted

## 2019-10-04 DIAGNOSIS — C50412 Malignant neoplasm of upper-outer quadrant of left female breast: Secondary | ICD-10-CM

## 2019-10-04 DIAGNOSIS — Z170 Estrogen receptor positive status [ER+]: Secondary | ICD-10-CM

## 2019-10-04 NOTE — Telephone Encounter (Signed)
Returned patient's phone call regarding rescheduling an appointment, left a voicemail. 

## 2019-10-05 ENCOUNTER — Telehealth: Payer: Self-pay | Admitting: Oncology

## 2019-10-05 ENCOUNTER — Encounter: Payer: Self-pay | Admitting: *Deleted

## 2019-10-05 NOTE — Telephone Encounter (Signed)
R/s appt per 10/28 sch message - pt aware of appt date and time

## 2019-10-05 NOTE — Progress Notes (Signed)
Per Dr. [name]: F/U in oncology 1-2 weeks after surgery. Noted that surgery scheduled for 11/16. Scheduling message sent for appointment.

## 2019-10-10 ENCOUNTER — Encounter: Payer: Self-pay | Admitting: Radiation Oncology

## 2019-10-10 NOTE — Progress Notes (Signed)
Location of Breast Cancer: Left Breast  Histology per Pathology Report:  09/22/19 Diagnosis Breast, left, needle core biopsy, 2 o'clock - INVASIVE DUCTAL CARCINOMA, SEE COMMENT.  Receptor Status: ER(95%), PR (95%), Her2-neu (NEG), Ki-(10%)  Did patient present with symptoms or was this found on screening mammography?: She self-palpated a left upper quadrant mass and presented for a screening mammogram which comfirmed the mass.   Past/Anticipated interventions by surgeon, if any: Surgery scheduled for 10/24/19 with Dr. Marlou Starks  Past/Anticipated interventions by medical oncology, if any:  Dr. [name] appointment scheduled for 11/04/19  Lymphedema issues, if any:   N/A  Pain issues, if any:  She denies.   SAFETY ISSUES:  Prior radiation? No  Pacemaker/ICD? No  Possible current pregnancy? No  Is the patient on methotrexate? No  Current Complaints / other details:   Acute myelogenous leukemia, monocytic differentiation diagnosed in March of 2015   Induction cytarabine/daunorubicin (7+3) followed by reinduction with cytarabine/daunorubicin and zinecard (5+2) with a recovering bone marrow 05/16/2014 consistent with remission   Status post cycle 1 high-dose araC consolidation 05/30/2014   Status post cycle 2 high-dose araC consolidation 07/11/2014.  Status post cycle 3 high-dose araC consolidation 08/22/2014.  Bone marrow 09/15/2014 showed necrotic marrow with minute focus of myeloid precursors. No evidence of viable acute leukemia  Managed by Dr. Bascom Levels, Stephani Police, RN 10/10/2019,10:10 AM

## 2019-10-17 ENCOUNTER — Encounter: Payer: Self-pay | Admitting: Radiation Oncology

## 2019-10-17 ENCOUNTER — Ambulatory Visit
Admission: RE | Admit: 2019-10-17 | Discharge: 2019-10-17 | Disposition: A | Discharge status: Home / Self Care | Payer: Medicare HMO | Source: Ambulatory Visit | Attending: Radiation Oncology | Admitting: Radiation Oncology

## 2019-10-17 ENCOUNTER — Other Ambulatory Visit: Payer: Self-pay

## 2019-10-17 DIAGNOSIS — Z170 Estrogen receptor positive status [ER+]: Secondary | ICD-10-CM | POA: Insufficient documentation

## 2019-10-17 DIAGNOSIS — C50412 Malignant neoplasm of upper-outer quadrant of left female breast: Secondary | ICD-10-CM

## 2019-10-17 DIAGNOSIS — Z856 Personal history of leukemia: Secondary | ICD-10-CM | POA: Diagnosis not present

## 2019-10-17 DIAGNOSIS — Z8542 Personal history of malignant neoplasm of other parts of uterus: Secondary | ICD-10-CM | POA: Diagnosis not present

## 2019-10-17 DIAGNOSIS — Z803 Family history of malignant neoplasm of breast: Secondary | ICD-10-CM | POA: Diagnosis not present

## 2019-10-17 NOTE — Progress Notes (Signed)
Patient's chart, ECHO reviewed with Dr Valma Cava. She has hx CAD-NSEMI 2018-stent-RCA, CHF, Cardiomyopathy, HTN IDDM, A-fib/flutter. There is clearance frm her cardiologist on chart. Dr Valma Cava states OK for [facility]. She will come in for EKG and BMP

## 2019-10-17 NOTE — Progress Notes (Signed)
Radiation Oncology         [phone] ________________________________  Initial outpatient Consultation by telephone as patient was unable to access MyChart video during pandemic precautions   Name: [name] MRN: [id number]  Date: 10/17/2019  DOB: January 12, 1941  [id number] [name], MD  Jovita Kussmaul, MD   REFERRING PHYSICIAN: Jovita Kussmaul, MD  DIAGNOSIS:    ICD-10-CM   1. Carcinoma of upper-outer quadrant of left breast in female, estrogen receptor positive ([place])  C50.412    Z17.0    Cancer Staging Carcinoma of upper-outer quadrant of left breast in female, estrogen receptor positive ([place]) Staging form: Breast, AJCC 8th Edition - Clinical stage from 10/17/2019: Stage IA (cT1c, cN0, cM0, G2, ER+, PR+, HER2-) - Signed by Eppie Gibson, MD on 10/17/2019  CHIEF COMPLAINT: Here to discuss management of left breast cancer  HISTORY OF PRESENT ILLNESS::[name] is a 78 y.o. female who presented with palpable mass, left breast, 2:00.  Mammogram 09-14-19 showed possible mass in left breast.  Ultrasound of breast on 09-20-19 revealed 1.3cm mass, 2:00, left breast, suspicious for cancer. No axillary adenopathy.   Biopsy showed IDC, Grade 2.  ER status: +; PR status +, Her2 status neg.  She is a survivor of acute myelogenous leukemia treated by Dr. Florene Glen at [facility] 5 years ago.  She also reports that many years ago she was treated for uterine cancer with surgery alone  She anticipates lumpectomy with Dr. Marlou Starks later this month.  She has a pending appointment with Dr. [name].  She still works, Public librarian.  Her mother died at 51 years old. She is not sure about her father - estimates he was in his 31s; he had throat cancer but she is not sure that was the cause of death.    PREVIOUS RADIATION THERAPY: No  PAST MEDICAL HISTORY:  has a past medical history of acute myeloblastic leukemia (AML) (dx'd 06/2014), Acute myelogenous leukemia ([place]) (02/2014), Acute  pancreatitis, Arthritis, Atrial flutter ([place]), Cardiomyopathy ([place]), Chronic kidney disease, stage II (mild), Chronic lower back pain, Colon polyps (04/29/2010), COPD (chronic obstructive pulmonary disease) ([place]), CORONARY ARTERY DISEASE (12/24/2007), DIABETES MELLITUS, TYPE II (07/13/2007), History of blood transfusion (2015), History of kidney stones (12/24/2007), HYPERLIPIDEMIA (12/24/2007), HYPERTENSION (07/13/2007), NSTEMI (non-ST elevated myocardial infarction) ([place]) (09/17/2017), Unspecified disease of pancreas, and Uterine cancer ([place]).    PAST SURGICAL HISTORY: Past Surgical History:  Procedure Laterality Date   ABDOMINAL HYSTERECTOMY     "still have my ovaries"   CARDIAC CATHETERIZATION  2002   CARDIAC CATHETERIZATION  09/17/2017   CATARACT EXTRACTION W/ INTRAOCULAR LENS  IMPLANT, BILATERAL Bilateral    COLONOSCOPY W/ BIOPSIES AND POLYPECTOMY  2011   CORONARY STENT INTERVENTION N/A 09/21/2017   Procedure: CORONARY STENT INTERVENTION;  Surgeon: Troy Sine, MD;  Location: [facility];  Service: Cardiovascular;  Laterality: N/A;   DILATION AND CURETTAGE OF UTERUS     EXCISIONAL HEMORRHOIDECTOMY  X 2   GANGLION CYST EXCISION Left X 3   INTRAVASCULAR PRESSURE WIRE/FFR STUDY N/A 09/17/2017   Procedure: INTRAVASCULAR PRESSURE WIRE/FFR STUDY;  Surgeon: Nelva Bush, MD;  Location: [facility];  Service: Cardiovascular;  Laterality: N/A;   NASAL SINUS SURGERY     PORTA CATH INSERTION Right 2013   RIGHT/LEFT HEART CATH AND CORONARY ANGIOGRAPHY N/A 09/17/2017   Procedure: RIGHT/LEFT HEART CATH AND CORONARY ANGIOGRAPHY;  Surgeon: Nelva Bush, MD;  Location: [facility];  Service: Cardiovascular;  Laterality: N/A;  TUBAL LIGATION      FAMILY HISTORY: family history includes Breast cancer in her sister; COPD in her father; Congestive Heart Failure in her mother; Heart attack in her maternal grandmother; Suicidality in her brother; Throat cancer in her  brother and father; Uterine cancer in her sister.  SOCIAL HISTORY:  reports that she quit smoking about 18 years ago. Her smoking use included cigarettes. She has a 20.00 pack-year smoking history. She has never used smokeless tobacco. She reports that she does not drink alcohol or use drugs.  ALLERGIES: Patient has no known allergies.  MEDICATIONS:  Current Outpatient Medications  Medication Sig Dispense Refill   amLODipine (NORVASC) 5 MG tablet TAKE 1 TABLET EVERY DAY 90 tablet 1   aspirin EC 81 MG tablet Take 1 tablet (81 mg total) by mouth daily.     atorvastatin (LIPITOR) 40 MG tablet TAKE 1 TABLET EVERY DAY 90 tablet 1   BD VEO INSULIN SYRINGE U/F 31G X 15/64" 0.3 ML MISC USE THREE TIMES DAILY 100 each 1   Blood Glucose Monitoring Suppl (TRUE METRIX GO GLUCOSE METER) w/Device KIT 30 Units by Does not apply route daily. 1 kit 0   clopidogrel (PLAVIX) 75 MG tablet TAKE 1 TABLET EVERY EVENING 90 tablet 1   cyanocobalamin (,VITAMIN B-12,) 1000 MCG/ML injection ADMINISTER 1 ML(1000 MCG) IN THE MUSCLE 1 TIME FOR 1 DOSE 10 mL 10   fluticasone furoate-vilanterol (BREO ELLIPTA) 200-25 MCG/INH AEPB Inhale 1 puff into the lungs daily. 120 each 5   furosemide (LASIX) 40 MG tablet Take 1.5 tablets (60 mg total) by mouth daily. 180 tablet 3   glucose blood (TRUE METRIX BLOOD GLUCOSE TEST) test strip Use to test glucose 3 times daily. Dx e11.9  Z79.4 - [employer] 300 each 12   HYDROcodone-acetaminophen (NORCO/VICODIN) 5-325 MG tablet Take 1 tablet by mouth every 6 (six) hours as needed for moderate pain. 120 tablet 0   HYDROcodone-acetaminophen (NORCO/VICODIN) 5-325 MG tablet Take 1 tablet by mouth every 6 (six) hours as needed for moderate pain. 120 tablet 0   Insulin Glargine, 2 Unit Dial, (TOUJEO MAX SOLOSTAR) 300 UNIT/ML SOPN Inject 18 Units into the skin at bedtime. 18 mL 3   Insulin Pen Needle (PEN NEEDLES 3/16") 31G X 5 MM MISC Use with Novolin R 100 each 0   insulin regular (NOVOLIN  R) 100 units/mL injection Inject 0.12 mLs (12 Units total) into the skin 3 (three) times daily before meals. INJECT 10 UNITS SUBCUTANEOUSLY 3 TIMES DAILY. INJECT 4 EXTRA UNITS IF CBG OVER 200. INJECT 6 EXTRA UNITS IF CBG OVER 300 40 mL 3   isosorbide mononitrate (IMDUR) 30 MG 24 hr tablet Take 1.5 tablets (45 mg total) by mouth daily. 135 tablet 3   LORazepam (ATIVAN) 0.5 MG tablet TAKE 1 TABLET BY MOUTH EVERY 8 HOURS AS NEEDED FOR ANXIETY(DO NOT EXCEED MORE THAN 2 TABS IN 24 HRS) 60 tablet 2   MAGNESIUM OXIDE 400 PO Take 400 mg by mouth 2 (two) times daily.     metoprolol succinate (TOPROL-XL) 100 MG 24 hr tablet TAKE 1 TABLET EVERY DAY  WITH  OR  IMMEDIATELY FOLLOWING A MEAL 90 tablet 1   polyethylene glycol (MIRALAX / GLYCOLAX) 17 g packet Take 17 g by mouth as needed.     potassium chloride (K-DUR) 10 MEQ tablet Take 1 tablet (10 mEq total) by mouth daily. 90 tablet 3   promethazine (PHENERGAN) 12.5 MG tablet Take 2 tablets (25 mg total) by mouth every  6 (six) hours as needed for nausea. 12 tablet 0   SYRINGE/NEEDLE, DISP, 1 ML (BD ECLIPSE SYRINGE) 25G X 5/8" 1 ML MISC Use for b12 injections 100 each 3   nitroGLYCERIN (NITROSTAT) 0.4 MG SL tablet Place 1 tablet (0.4 mg total) under the tongue every 5 (five) minutes as needed for chest pain. 24 tablet 1   No current facility-administered medications for this encounter.    Facility-Administered Medications Ordered in Other Encounters  Medication Dose Route Frequency Provider Last Rate Last Dose   sodium chloride 0.9 % 1,000 mL with magnesium sulfate 2 g infusion   Intravenous Continuous Ladell Pier, MD   Stopped at 08/04/14 1441    REVIEW OF SYSTEMS: As above   PHYSICAL EXAM:  vitals were not taken for this visit.   General: Alert and oriented, in no acute distress     LABORATORY DATA:  Lab Results  Component Value Date   WBC 6.6 03/29/2019   HGB 11.8 (L) 03/29/2019   HCT 35.6 (L) 03/29/2019   MCV 95.2 03/29/2019   PLT  121 (L) 03/29/2019   CMP     Component Value Date/Time   NA 141 05/19/2019 1546   NA 144 09/04/2017 1436   K 3.9 05/19/2019 1546   K 4.1 09/04/2017 1436   CL 107 (H) 05/19/2019 1546   CO2 22 05/19/2019 1546   CO2 26 09/04/2017 1436   GLUCOSE 237 (H) 05/19/2019 1546   GLUCOSE 340 (H) 03/29/2019 1459   GLUCOSE 152 (H) 09/04/2017 1436   BUN 22 05/19/2019 1546   BUN 20.6 09/04/2017 1436   CREATININE 1.38 (H) 05/19/2019 1546   CREATININE 1.48 (H) 03/29/2019 1459   CREATININE 1.4 (H) 09/04/2017 1436   CALCIUM 8.1 (L) 05/19/2019 1546   CALCIUM 9.4 09/04/2017 1436   PROT 5.8 (L) 03/29/2019 1459   PROT 5.8 (L) 02/17/2018 1005   PROT 6.3 (L) 09/04/2017 1436   ALBUMIN 2.9 (L) 03/29/2019 1459   ALBUMIN 3.7 02/17/2018 1005   ALBUMIN 3.4 (L) 09/04/2017 1436   AST 24 03/29/2019 1459   AST 20 09/04/2017 1436   ALT 21 03/29/2019 1459   ALT 15 09/04/2017 1436   ALKPHOS 190 (H) 03/29/2019 1459   ALKPHOS 84 09/04/2017 1436   BILITOT 0.6 03/29/2019 1459   BILITOT 0.68 09/04/2017 1436   GFRNONAA 37 (L) 05/19/2019 1546   GFRNONAA 34 (L) 03/29/2019 1459   GFRAA 43 (L) 05/19/2019 1546   GFRAA 39 (L) 03/29/2019 1459         RADIOGRAPHY: US Breast Ltd Uni Left Inc Axilla  Result Date: 09/20/2019 CLINICAL DATA:  78 year old patient recalled from recent screening mammogram for evaluation of a left breast mass. EXAM: DIGITAL DIAGNOSTIC LEFT MAMMOGRAM WITH CAD AND TOMO ULTRASOUND LEFT BREAST COMPARISON:  September 13, 2019 ACR Breast Density Category b: There are scattered areas of fibroglandular density. FINDINGS: There is an irregular mass in the posterior third of the upper-outer quadrant of the left breast. Mammographic images were processed with CAD. On physical exam, I palpate a 1 cm mass in the 2 o'clock position left breast approximately 10 cm from the nipple. In the inferior left axilla is prominent soft fullness. Targeted ultrasound is performed, showing a hypoechoic irregular mass with  internal vascularity at 2 o'clock position 10 cm from the nipple. This mass measures 1.2 x 0.9 x 1.3 cm and corresponds to the mass seen on the mammogram. Ultrasound of the soft prominent fullness in the inferior left axilla shows  normal fatty tissue. Evaluation of the left axillary lymph nodes shows normal size and morphology of axillary lymph nodes. No axillary lymphadenopathy identified. IMPRESSION: 1.3 cm suspicious mass in the 2 o'clock position left breast is suspicious for malignancy. Negative for left axillary lymphadenopathy. Palpable fullness in the left axilla corresponds to benign fatty prominence on ultrasound. RECOMMENDATION: Ultrasound-guided core needle biopsy of the left breast mass is recommended and is being scheduled for the patient. I have discussed the findings and recommendations with the patient. If applicable, a reminder letter will be sent to the patient regarding the next appointment. BI-RADS CATEGORY  5: Highly suggestive of malignancy. Electronically Signed   By: Curlene Dolphin M.D.   On: 09/20/2019 16:53   Mm Diag Breast Tomo Uni Left  Result Date: 09/20/2019 CLINICAL DATA:  78 year old patient recalled from recent screening mammogram for evaluation of a left breast mass. EXAM: DIGITAL DIAGNOSTIC LEFT MAMMOGRAM WITH CAD AND TOMO ULTRASOUND LEFT BREAST COMPARISON:  September 13, 2019 ACR Breast Density Category b: There are scattered areas of fibroglandular density. FINDINGS: There is an irregular mass in the posterior third of the upper-outer quadrant of the left breast. Mammographic images were processed with CAD. On physical exam, I palpate a 1 cm mass in the 2 o'clock position left breast approximately 10 cm from the nipple. In the inferior left axilla is prominent soft fullness. Targeted ultrasound is performed, showing a hypoechoic irregular mass with internal vascularity at 2 o'clock position 10 cm from the nipple. This mass measures 1.2 x 0.9 x 1.3 cm and corresponds to the mass  seen on the mammogram. Ultrasound of the soft prominent fullness in the inferior left axilla shows normal fatty tissue. Evaluation of the left axillary lymph nodes shows normal size and morphology of axillary lymph nodes. No axillary lymphadenopathy identified. IMPRESSION: 1.3 cm suspicious mass in the 2 o'clock position left breast is suspicious for malignancy. Negative for left axillary lymphadenopathy. Palpable fullness in the left axilla corresponds to benign fatty prominence on ultrasound. RECOMMENDATION: Ultrasound-guided core needle biopsy of the left breast mass is recommended and is being scheduled for the patient. I have discussed the findings and recommendations with the patient. If applicable, a reminder letter will be sent to the patient regarding the next appointment. BI-RADS CATEGORY  5: Highly suggestive of malignancy. Electronically Signed   By: Curlene Dolphin M.D.   On: 09/20/2019 16:53   Mm Clip Placement Left  Result Date: 09/22/2019 CLINICAL DATA:  Evaluate biopsy marker EXAM: DIAGNOSTIC LEFT MAMMOGRAM POST ULTRASOUND BIOPSY COMPARISON:  Previous exam(s). FINDINGS: Mammographic images were obtained following ultrasound guided biopsy of a left breast mass. The biopsy marking clip is in expected position at the site of biopsy. IMPRESSION: Appropriate positioning of the type shaped biopsy marking clip at the site of biopsy in the location. Final Assessment: Post Procedure Mammograms for Marker Placement Electronically Signed   By: Dorise Bullion III M.D   On: 09/22/2019 15:33   [place] Lt Breast Bx W Loc Dev 1st Lesion Img Bx Spec US Guide  Addendum Date: 09/26/2019   ADDENDUM REPORT: 09/23/2019 14:31 ADDENDUM: Pathology revealed GRADE II INVASIVE DUCTAL CARCINOMA, of the LEFT breast, 2 o'clock. This was found to be concordant by Dr. Dorise Bullion. Pathology results were discussed with the patient by telephone. The patient reported doing well after the biopsy with tenderness at the site. Post  biopsy instructions and care were reviewed and questions were answered. The patient was encouraged to call [facility] for any additional concerns. Surgical consultation has been arranged with Dr. Autumn Messing at [facility] on September 27, 2019. Pathology results reported by Stacie Acres, RN on 09/23/2019. Electronically Signed   By: Dorise Bullion III M.D   On: 09/23/2019 14:31   Result Date: 09/26/2019 CLINICAL DATA:  BIOPSY OF A LEFT BREAST MASS EXAM: ULTRASOUND GUIDED LEFT BREAST CORE NEEDLE BIOPSY COMPARISON:  Previous exam(s). FINDINGS: I met with the patient and we discussed the procedure of ultrasound-guided biopsy, including benefits and alternatives. We discussed the high likelihood of a successful procedure. We discussed the risks of the procedure, including infection, bleeding, tissue injury, clip migration, and inadequate sampling. Informed written consent was given. The usual time-out protocol was performed immediately prior to the procedure. Lesion quadrant: UPPER-OUTER Using sterile technique and 1% Lidocaine as local anesthetic, under direct ultrasound visualization, a 12 gauge spring-loaded device was used to perform biopsy of 2 O'CLOCK LEFT BREAST MASS using a MEDIAL approach. At the conclusion of the procedure tissue marker clip was deployed into the biopsy cavity. Follow up 2 view mammogram was performed and dictated separately. IMPRESSION: Ultrasound guided biopsy of THE 2 O'CLOCK LEFT BREAST MASS. No apparent complications. Electronically Signed: By: Dorise Bullion III M.D On: 09/22/2019 15:25      IMPRESSION/PLAN: Left breast cancer   I talked to her about the option of a mastectomy and informed her that her expected overall survival would be equivalent between mastectomy and breast conservation, based upon randomized controlled data. She is enthusiastic about breast conservation.  It was a pleasure meeting the patient today. We discussed the  risks, benefits, and side effects of radiotherapy.  We talked about adjuvant treatment following lumpectomy.  She still has to meet with medical oncology to discuss the antiestrogen pill.  She has some concerns about the affordability of this pill but I told her not to rule anything out until she discusses insurance coverage with the medical oncology team.  I am optimistic that the antiestrogen pill will be covered by her insurance. .  For the patient's early stage favorable risk breast cancer, we had a thorough discussion about her options for adjuvant therapy. One option would be antiestrogen therapy as discussed with medical oncology. She would take a pill for approximately 5 years. The alternative option (but less standard) would be radiotherapy to the breast. The most aggressive option would be to pursue both modalities.  I told the patient that her overall life expectancy should not be affected by adding radiotherapy to antiestrogen medication. She understands that the main benefit of  adding radiotherapy to anti estrogen therapy would be a very small but measurable local control benefit.  We discussed the risks benefits and side effects of radiotherapy. She understands that the side effects would likely include some skin irritation and fatigue during the weeks of radiation. There is a risk of late effects which include but are not necessarily limited to cosmetic changes and rare lung toxicity. I would anticipate delivering approximately 3-4 weeks of radiotherapy.  We spoke about acute effects including skin irritation and fatigue as well as much less common late effects including internal organ injury or irritation. We spoke about the latest technology that is used to minimize the risk of late effects for patients undergoing radiotherapy to the breast or chest wall. No guarantees of treatment were given. The patient is enthusiastic about proceeding with treatment. I look forward to participating in  the patient's care.  I will await  her referral back to me for postoperative follow-up and eventual CT simulation/treatment planning.   This encounter was provided by telemedicine platform by telephone as patient was unable to access MyChart video during pandemic precautions The patient has given verbal consent for this type of encounter and has been advised to only accept a meeting of this type in a secure network environment. The time spent during this encounter was 15 minutes. The attendants for this meeting include Eppie Gibson  and [name].  During the encounter, Eppie Gibson was located at [facility] Radiation Oncology Department.  [name] was located at home.    __________________________________________   Eppie Gibson, MD

## 2019-10-18 ENCOUNTER — Encounter (HOSPITAL_BASED_OUTPATIENT_CLINIC_OR_DEPARTMENT_OTHER): Payer: Self-pay | Admitting: *Deleted

## 2019-10-18 ENCOUNTER — Other Ambulatory Visit: Payer: Self-pay

## 2019-10-20 ENCOUNTER — Other Ambulatory Visit (HOSPITAL_COMMUNITY): Payer: Medicare HMO

## 2019-10-20 ENCOUNTER — Other Ambulatory Visit: Payer: Self-pay

## 2019-10-20 ENCOUNTER — Other Ambulatory Visit (HOSPITAL_COMMUNITY)
Admission: RE | Admit: 2019-10-20 | Discharge: 2019-10-20 | Disposition: A | Discharge status: Home / Self Care | Payer: Medicare HMO | Source: Ambulatory Visit | Attending: General Surgery | Admitting: General Surgery

## 2019-10-20 ENCOUNTER — Encounter (HOSPITAL_BASED_OUTPATIENT_CLINIC_OR_DEPARTMENT_OTHER)
Admission: RE | Admit: 2019-10-20 | Discharge: 2019-10-20 | Disposition: A | Discharge status: Home / Self Care | Payer: Medicare HMO | Source: Ambulatory Visit | Attending: General Surgery | Admitting: General Surgery

## 2019-10-20 DIAGNOSIS — Z20828 Contact with and (suspected) exposure to other viral communicable diseases: Secondary | ICD-10-CM | POA: Insufficient documentation

## 2019-10-20 DIAGNOSIS — C50912 Malignant neoplasm of unspecified site of left female breast: Secondary | ICD-10-CM | POA: Insufficient documentation

## 2019-10-20 DIAGNOSIS — Z01818 Encounter for other preprocedural examination: Secondary | ICD-10-CM | POA: Insufficient documentation

## 2019-10-20 LAB — BASIC METABOLIC PANEL
Anion gap: 10 (ref 5–15)
BUN: 15 mg/dL (ref 8–23)
CO2: 24 mmol/L (ref 22–32)
Calcium: 7.9 mg/dL — ABNORMAL LOW (ref 8.9–10.3)
Chloride: 103 mmol/L (ref 98–111)
Creatinine, Ser: 1.26 mg/dL — ABNORMAL HIGH (ref 0.44–1.00)
GFR calc Af Amer: 48 mL/min — ABNORMAL LOW (ref >60)
GFR calc non Af Amer: 41 mL/min — ABNORMAL LOW (ref >60)
Glucose, Bld: 355 mg/dL — ABNORMAL HIGH (ref 70–99)
Potassium: 4 mmol/L (ref 3.5–5.1)
Sodium: 137 mmol/L (ref 135–145)

## 2019-10-20 NOTE — Progress Notes (Signed)

## 2019-10-21 LAB — NOVEL CORONAVIRUS, NAA (HOSP ORDER, SEND-OUT TO REF LAB; TAT 18-24 HRS): SARS-CoV-2, NAA: NOT DETECTED

## 2019-10-21 NOTE — Progress Notes (Signed)
Dr Fransisco Beau with anesthesia reviewed patients glucose of 355 and EKG. Instructed to check CBG upon patient arrival to [facility] on day of surgery.

## 2019-10-21 NOTE — Progress Notes (Signed)
Notified [name] at Dr. Ethlyn Gallery office of pt's glucose-[phone]

## 2019-10-22 ENCOUNTER — Other Ambulatory Visit: Payer: Self-pay | Admitting: Internal Medicine

## 2019-10-22 DIAGNOSIS — I2510 Atherosclerotic heart disease of native coronary artery without angina pectoris: Secondary | ICD-10-CM

## 2019-10-23 NOTE — Anesthesia Preprocedure Evaluation (Addendum)
Anesthesia Evaluation  Patient identified by MRN, date of birth, ID band  Reviewed: Allergy & Precautions, NPO status , Patient's Chart, lab work & pertinent test results  Airway Mallampati: II  TM Distance: >3 FB Neck ROM: Full    Dental no notable dental hx. (+) Upper Dentures, Lower Dentures   Pulmonary COPD, former smoker,    Pulmonary exam normal breath sounds clear to auscultation       Cardiovascular hypertension, Pt. on medications and Pt. on home beta blockers + CAD  Normal cardiovascular exam+ dysrhythmias Atrial Fibrillation  Rhythm:Regular Rate:Normal     Neuro/Psych    GI/Hepatic negative GI ROS, Neg liver ROS,   Endo/Other  diabetes, Type 2  Renal/GU      Musculoskeletal   Abdominal (+) + obese,   Peds  Hematology   Anesthesia Other Findings   Reproductive/Obstetrics                            Anesthesia Physical Anesthesia Plan  ASA: III  Anesthesia Plan: General   Post-op Pain Management:    Induction: Intravenous  PONV Risk Score and Plan: 4 or greater and Treatment may vary due to age or medical condition, Ondansetron and Dexamethasone  Airway Management Planned: LMA  Additional Equipment: None  Intra-op Plan:   Post-operative Plan: Extubation in OR  Informed Consent: I have reviewed the patients History and Physical, chart, labs and discussed the procedure including the risks, benefits and alternatives for the proposed anesthesia with the patient or authorized representative who has indicated his/her understanding and acceptance.     Dental advisory given  Plan Discussed with: CRNA  Anesthesia Plan Comments:        Anesthesia Quick Evaluation

## 2019-10-24 ENCOUNTER — Encounter (HOSPITAL_BASED_OUTPATIENT_CLINIC_OR_DEPARTMENT_OTHER)
Admission: RE | Disposition: A | Discharge status: Home / Self Care | Payer: Self-pay | Source: Home / Self Care | Attending: General Surgery

## 2019-10-24 ENCOUNTER — Ambulatory Visit
Admission: RE | Admit: 2019-10-24 | Discharge: 2019-10-24 | Disposition: A | Discharge status: Home / Self Care | Payer: Medicare HMO | Source: Ambulatory Visit | Attending: General Surgery | Admitting: General Surgery

## 2019-10-24 ENCOUNTER — Other Ambulatory Visit: Payer: Medicare HMO

## 2019-10-24 ENCOUNTER — Ambulatory Visit (HOSPITAL_BASED_OUTPATIENT_CLINIC_OR_DEPARTMENT_OTHER): Payer: Medicare HMO | Admitting: Anesthesiology

## 2019-10-24 ENCOUNTER — Ambulatory Visit: Payer: Medicare HMO | Admitting: Oncology

## 2019-10-24 ENCOUNTER — Encounter (HOSPITAL_BASED_OUTPATIENT_CLINIC_OR_DEPARTMENT_OTHER): Payer: Self-pay

## 2019-10-24 ENCOUNTER — Other Ambulatory Visit: Payer: Self-pay | Admitting: General Surgery

## 2019-10-24 ENCOUNTER — Ambulatory Visit (HOSPITAL_BASED_OUTPATIENT_CLINIC_OR_DEPARTMENT_OTHER)
Admission: RE | Admit: 2019-10-24 | Discharge: 2019-10-24 | Disposition: A | Discharge status: Home / Self Care | Payer: Medicare HMO | Attending: General Surgery | Admitting: General Surgery

## 2019-10-24 ENCOUNTER — Other Ambulatory Visit: Payer: Self-pay

## 2019-10-24 DIAGNOSIS — Z170 Estrogen receptor positive status [ER+]: Secondary | ICD-10-CM

## 2019-10-24 DIAGNOSIS — I509 Heart failure, unspecified: Secondary | ICD-10-CM | POA: Insufficient documentation

## 2019-10-24 DIAGNOSIS — I252 Old myocardial infarction: Secondary | ICD-10-CM | POA: Diagnosis not present

## 2019-10-24 DIAGNOSIS — N189 Chronic kidney disease, unspecified: Secondary | ICD-10-CM | POA: Insufficient documentation

## 2019-10-24 DIAGNOSIS — Z803 Family history of malignant neoplasm of breast: Secondary | ICD-10-CM | POA: Diagnosis not present

## 2019-10-24 DIAGNOSIS — C50412 Malignant neoplasm of upper-outer quadrant of left female breast: Secondary | ICD-10-CM | POA: Diagnosis not present

## 2019-10-24 DIAGNOSIS — Z955 Presence of coronary angioplasty implant and graft: Secondary | ICD-10-CM | POA: Insufficient documentation

## 2019-10-24 DIAGNOSIS — Z8541 Personal history of malignant neoplasm of cervix uteri: Secondary | ICD-10-CM | POA: Insufficient documentation

## 2019-10-24 DIAGNOSIS — Z7982 Long term (current) use of aspirin: Secondary | ICD-10-CM | POA: Insufficient documentation

## 2019-10-24 DIAGNOSIS — Z794 Long term (current) use of insulin: Secondary | ICD-10-CM | POA: Diagnosis not present

## 2019-10-24 DIAGNOSIS — C50912 Malignant neoplasm of unspecified site of left female breast: Secondary | ICD-10-CM | POA: Diagnosis not present

## 2019-10-24 DIAGNOSIS — E785 Hyperlipidemia, unspecified: Secondary | ICD-10-CM | POA: Diagnosis not present

## 2019-10-24 DIAGNOSIS — E1122 Type 2 diabetes mellitus with diabetic chronic kidney disease: Secondary | ICD-10-CM | POA: Diagnosis not present

## 2019-10-24 DIAGNOSIS — I2510 Atherosclerotic heart disease of native coronary artery without angina pectoris: Secondary | ICD-10-CM | POA: Diagnosis not present

## 2019-10-24 DIAGNOSIS — I130 Hypertensive heart and chronic kidney disease with heart failure and stage 1 through stage 4 chronic kidney disease, or unspecified chronic kidney disease: Secondary | ICD-10-CM | POA: Diagnosis not present

## 2019-10-24 DIAGNOSIS — Z79899 Other long term (current) drug therapy: Secondary | ICD-10-CM | POA: Diagnosis not present

## 2019-10-24 DIAGNOSIS — N6320 Unspecified lump in the left breast, unspecified quadrant: Secondary | ICD-10-CM | POA: Diagnosis not present

## 2019-10-24 HISTORY — PX: BREAST LUMPECTOMY: SHX2

## 2019-10-24 HISTORY — PX: BREAST LUMPECTOMY WITH RADIOACTIVE SEED LOCALIZATION: SHX6424

## 2019-10-24 LAB — GLUCOSE, CAPILLARY
Glucose-Capillary: 142 mg/dL — ABNORMAL HIGH (ref 70–99)
Glucose-Capillary: 157 mg/dL — ABNORMAL HIGH (ref 70–99)

## 2019-10-24 SURGERY — BREAST LUMPECTOMY WITH RADIOACTIVE SEED LOCALIZATION
Anesthesia: General | Site: Breast | Laterality: Left

## 2019-10-24 MED ORDER — MIDAZOLAM HCL 2 MG/2ML IJ SOLN: 1.0000 mg | INTRAMUSCULAR | Status: DC | PRN

## 2019-10-24 MED ORDER — ONDANSETRON HCL 4 MG/2ML IJ SOLN: 4.0000 mg | Freq: Once | INTRAMUSCULAR | Status: DC | PRN

## 2019-10-24 MED ORDER — LIDOCAINE 2% (20 MG/ML) 5 ML SYRINGE
INTRAMUSCULAR | Status: AC
  Filled 2019-10-24: qty 5

## 2019-10-24 MED ORDER — CEFAZOLIN SODIUM-DEXTROSE 2-4 GM/100ML-% IV SOLN: 2.0000 g | INTRAVENOUS | Status: DC

## 2019-10-24 MED ORDER — ACETAMINOPHEN 500 MG PO TABS
1000.0000 mg | ORAL_TABLET | ORAL | Status: AC
  Administered 2019-10-24: 1000 mg via ORAL

## 2019-10-24 MED ORDER — HYDROCODONE-ACETAMINOPHEN 5-325 MG PO TABS: 1.0000 | ORAL_TABLET | ORAL | 0 refills | Status: DC | PRN

## 2019-10-24 MED ORDER — ONDANSETRON HCL 4 MG/2ML IJ SOLN
INTRAMUSCULAR | Status: DC | PRN
  Administered 2019-10-24: 4 mg via INTRAVENOUS

## 2019-10-24 MED ORDER — FENTANYL CITRATE (PF) 100 MCG/2ML IJ SOLN
50.0000 ug | INTRAMUSCULAR | Status: AC | PRN
  Administered 2019-10-24: 50 ug via INTRAVENOUS
  Administered 2019-10-24 (×2): 25 ug via INTRAVENOUS

## 2019-10-24 MED ORDER — CEFAZOLIN SODIUM-DEXTROSE 2-3 GM-%(50ML) IV SOLR
INTRAVENOUS | Status: DC | PRN
  Administered 2019-10-24: 2 g via INTRAVENOUS

## 2019-10-24 MED ORDER — GABAPENTIN 300 MG PO CAPS
ORAL_CAPSULE | ORAL | Status: AC
  Filled 2019-10-24: qty 1

## 2019-10-24 MED ORDER — CELECOXIB 200 MG PO CAPS
200.0000 mg | ORAL_CAPSULE | ORAL | Status: AC
  Administered 2019-10-24: 13:00:00 200 mg via ORAL

## 2019-10-24 MED ORDER — ACETAMINOPHEN 10 MG/ML IV SOLN: 1000.0000 mg | Freq: Once | INTRAVENOUS | Status: DC | PRN

## 2019-10-24 MED ORDER — LACTATED RINGERS IV SOLN
INTRAVENOUS | Status: DC
  Administered 2019-10-24: 14:00:00 via INTRAVENOUS

## 2019-10-24 MED ORDER — CHLORHEXIDINE GLUCONATE CLOTH 2 % EX PADS: 6.0000 | MEDICATED_PAD | Freq: Once | CUTANEOUS | Status: DC

## 2019-10-24 MED ORDER — PROPOFOL 10 MG/ML IV BOLUS
INTRAVENOUS | Status: AC
  Filled 2019-10-24: qty 40

## 2019-10-24 MED ORDER — BUPIVACAINE-EPINEPHRINE (PF) 0.25% -1:200000 IJ SOLN
INTRAMUSCULAR | Status: DC | PRN
  Administered 2019-10-24: 20 mL

## 2019-10-24 MED ORDER — ONDANSETRON 4 MG PO TBDP
ORAL_TABLET | ORAL | Status: AC
  Filled 2019-10-24: qty 1

## 2019-10-24 MED ORDER — FENTANYL CITRATE (PF) 100 MCG/2ML IJ SOLN
INTRAMUSCULAR | Status: AC
  Filled 2019-10-24: qty 2

## 2019-10-24 MED ORDER — 0.9 % SODIUM CHLORIDE (POUR BTL) OPTIME
TOPICAL | Status: DC | PRN
  Administered 2019-10-24: 1000 mL

## 2019-10-24 MED ORDER — LIDOCAINE HCL (CARDIAC) PF 100 MG/5ML IV SOSY
PREFILLED_SYRINGE | INTRAVENOUS | Status: DC | PRN
  Administered 2019-10-24: 100 mg via INTRAVENOUS

## 2019-10-24 MED ORDER — CELECOXIB 200 MG PO CAPS
ORAL_CAPSULE | ORAL | Status: AC
  Filled 2019-10-24: qty 1

## 2019-10-24 MED ORDER — ONDANSETRON 4 MG PO TBDP
4.0000 mg | ORAL_TABLET | Freq: Once | ORAL | Status: AC
  Administered 2019-10-24: 4 mg via ORAL

## 2019-10-24 MED ORDER — ACETAMINOPHEN 500 MG PO TABS
ORAL_TABLET | ORAL | Status: AC
  Filled 2019-10-24: qty 2

## 2019-10-24 MED ORDER — CEFAZOLIN SODIUM-DEXTROSE 2-4 GM/100ML-% IV SOLN
INTRAVENOUS | Status: AC
  Filled 2019-10-24: qty 100

## 2019-10-24 MED ORDER — GABAPENTIN 300 MG PO CAPS
300.0000 mg | ORAL_CAPSULE | ORAL | Status: AC
  Administered 2019-10-24: 13:00:00 300 mg via ORAL

## 2019-10-24 MED ORDER — FENTANYL CITRATE (PF) 100 MCG/2ML IJ SOLN: 25.0000 ug | INTRAMUSCULAR | Status: DC | PRN

## 2019-10-24 MED ORDER — PROPOFOL 10 MG/ML IV BOLUS
INTRAVENOUS | Status: DC | PRN
  Administered 2019-10-24: 100 mg via INTRAVENOUS

## 2019-10-24 MED ORDER — ONDANSETRON HCL 4 MG/2ML IJ SOLN
INTRAMUSCULAR | Status: AC
  Filled 2019-10-24: qty 2

## 2019-10-24 SURGICAL SUPPLY — 44 items
ADH SKN CLS APL DERMABOND .7 (GAUZE/BANDAGES/DRESSINGS) ×1
APL PRP STRL LF DISP 70% ISPRP (MISCELLANEOUS) ×1
APPLIER CLIP 9.375 MED OPEN (MISCELLANEOUS) ×2
APR CLP MED 9.3 20 MLT OPN (MISCELLANEOUS) ×1
BINDER BREAST XXLRG (GAUZE/BANDAGES/DRESSINGS) ×1 IMPLANT
BLADE SURG 15 STRL LF DISP TIS (BLADE) ×1 IMPLANT
BLADE SURG 15 STRL SS (BLADE) ×2
CANISTER SUC SOCK COL 7IN (MISCELLANEOUS) ×2 IMPLANT
CANISTER SUCT 1200ML W/VALVE (MISCELLANEOUS) ×2 IMPLANT
CHLORAPREP W/TINT 26 (MISCELLANEOUS) ×2 IMPLANT
CLIP APPLIE 9.375 MED OPEN (MISCELLANEOUS) IMPLANT
COVER BACK TABLE REUSABLE LG (DRAPES) ×2 IMPLANT
COVER MAYO STAND REUSABLE (DRAPES) ×2 IMPLANT
COVER PROBE W GEL 5X96 (DRAPES) ×2 IMPLANT
COVER WAND RF STERILE (DRAPES) IMPLANT
DECANTER SPIKE VIAL GLASS SM (MISCELLANEOUS) IMPLANT
DERMABOND ADVANCED (GAUZE/BANDAGES/DRESSINGS) ×1
DERMABOND ADVANCED .7 DNX12 (GAUZE/BANDAGES/DRESSINGS) ×1 IMPLANT
DRAPE LAPAROSCOPIC ABDOMINAL (DRAPES) ×2 IMPLANT
DRAPE UTILITY XL STRL (DRAPES) ×2 IMPLANT
ELECT COATED BLADE 2.86 ST (ELECTRODE) ×2 IMPLANT
ELECT REM PT RETURN 9FT ADLT (ELECTROSURGICAL) ×2
ELECTRODE REM PT RTRN 9FT ADLT (ELECTROSURGICAL) ×1 IMPLANT
GLOVE BIO SURGEON STRL SZ7.5 (GLOVE) ×4 IMPLANT
GOWN STRL REUS W/ TWL LRG LVL3 (GOWN DISPOSABLE) ×2 IMPLANT
GOWN STRL REUS W/TWL LRG LVL3 (GOWN DISPOSABLE) ×4
ILLUMINATOR WAVEGUIDE N/F (MISCELLANEOUS) IMPLANT
KIT MARKER MARGIN INK (KITS) ×2 IMPLANT
LIGHT WAVEGUIDE WIDE FLAT (MISCELLANEOUS) IMPLANT
NDL HYPO 25X1 1.5 SAFETY (NEEDLE) IMPLANT
NEEDLE HYPO 25X1 1.5 SAFETY (NEEDLE) IMPLANT
NS IRRIG 1000ML POUR BTL (IV SOLUTION) IMPLANT
PACK BASIN DAY SURGERY FS (CUSTOM PROCEDURE TRAY) ×2 IMPLANT
PENCIL SMOKE EVACUATOR (MISCELLANEOUS) ×2 IMPLANT
SLEEVE SCD COMPRESS KNEE MED (MISCELLANEOUS) ×2 IMPLANT
SPONGE LAP 18X18 RF (DISPOSABLE) ×2 IMPLANT
SUT MON AB 4-0 PC3 18 (SUTURE) ×2 IMPLANT
SUT SILK 2 0 SH (SUTURE) IMPLANT
SUT VICRYL 3-0 CR8 SH (SUTURE) ×2 IMPLANT
SYR CONTROL 10ML LL (SYRINGE) IMPLANT
TOWEL GREEN STERILE FF (TOWEL DISPOSABLE) ×2 IMPLANT
TRAY FAXITRON CT DISP (TRAY / TRAY PROCEDURE) ×2 IMPLANT
TUBE CONNECTING 20X1/4 (TUBING) ×2 IMPLANT
YANKAUER SUCT BULB TIP NO VENT (SUCTIONS) IMPLANT

## 2019-10-24 NOTE — Interval H&P Note (Signed)
History and Physical Interval Note:  10/24/2019 1:05 PM  [name]  has presented today for surgery, with the diagnosis of left breast cancer.  The various methods of treatment have been discussed with the patient and family. After consideration of risks, benefits and other options for treatment, the patient has consented to  Procedure(s): LEFT BREAST LUMPECTOMY WITH RADIOACTIVE SEED LOCALIZATION (Left) as a surgical intervention.  The patient's history has been reviewed, patient examined, no change in status, stable for surgery.  I have reviewed the patient's chart and labs.  Questions were answered to the patient's satisfaction.     [name]

## 2019-10-24 NOTE — Discharge Instructions (Signed)
  Post Anesthesia Home Care Instructions  Activity: Get plenty of rest for the remainder of the day. A responsible individual must stay with you for 24 hours following the procedure.  For the next 24 hours, DO NOT: -Drive a car -Operate machinery -Drink alcoholic beverages -Take any medication unless instructed by your physician -Make any legal decisions or sign important papers.  Meals: Start with liquid foods such as gelatin or soup. Progress to regular foods as tolerated. Avoid greasy, spicy, heavy foods. If nausea and/or vomiting occur, drink only clear liquids until the nausea and/or vomiting subsides. Call your physician if vomiting continues.  Special Instructions/Symptoms: Your throat may feel dry or sore from the anesthesia or the breathing tube placed in your throat during surgery. If this causes discomfort, gargle with warm salt water. The discomfort should disappear within 24 hours.  If you had a scopolamine patch placed behind your ear for the management of post- operative nausea and/or vomiting:  1. The medication in the patch is effective for 72 hours, after which it should be removed.  Wrap patch in a tissue and discard in the trash. Wash hands thoroughly with soap and water. 2. You may remove the patch earlier than 72 hours if you experience unpleasant side effects which may include dry mouth, dizziness or visual disturbances. 3. Avoid touching the patch. Wash your hands with soap and water after contact with the patch.         Call your surgeon if you experience:   1.  Fever over 101.0. 2.  Inability to urinate. 3.  Nausea and/or vomiting. 4.  Extreme swelling or bruising at the surgical site. 5.  Continued bleeding from the incision. 6.  Increased pain, redness or drainage from the incision. 7.  Problems related to your pain medication. 8.  Any problems and/or concerns  

## 2019-10-24 NOTE — Anesthesia Postprocedure Evaluation (Signed)
Anesthesia Post Note  Patient: [name]  Procedure(s) Performed: LEFT BREAST LUMPECTOMY WITH RADIOACTIVE SEED LOCALIZATION (Left Breast)     Patient location during evaluation: PACU Anesthesia Type: General Level of consciousness: awake and alert Pain management: pain level controlled Vital Signs Assessment: post-procedure vital signs reviewed and stable Respiratory status: spontaneous breathing, nonlabored ventilation, respiratory function stable and patient connected to nasal cannula oxygen Cardiovascular status: blood pressure returned to baseline and stable Postop Assessment: no apparent nausea or vomiting Anesthetic complications: no    Last Vitals:  Vitals:   10/24/19 1530 10/24/19 1543  BP: (!) 128/46 (!) 157/57  Pulse:  (!) 56  Resp: 13 16  Temp:  36.8 C  SpO2:      Last Pain:  Vitals:   10/24/19 1529  TempSrc:   PainSc: 0-No pain                 Barnet Glasgow

## 2019-10-24 NOTE — Anesthesia Procedure Notes (Signed)
Procedure Name: LMA Insertion Performed by: Verita Lamb, CRNA Pre-anesthesia Checklist: Patient identified, Emergency Drugs available, Suction available and Patient being monitored Patient Re-evaluated:Patient Re-evaluated prior to induction Oxygen Delivery Method: Circle system utilized Preoxygenation: Pre-oxygenation with 100% oxygen Induction Type: IV induction Ventilation: Mask ventilation without difficulty LMA: LMA inserted LMA Size: 4.0 Tube type: Oral Number of attempts: 1 Airway Equipment and Method: Bite block Placement Confirmation: positive ETCO2 Tube secured with: Tape Dental Injury: Teeth and Oropharynx as per pre-operative assessment

## 2019-10-24 NOTE — Transfer of Care (Signed)
Immediate Anesthesia Transfer of Care Note  Patient: [name]  Procedure(s) Performed: LEFT BREAST LUMPECTOMY WITH RADIOACTIVE SEED LOCALIZATION (Left Breast)  Patient Location: PACU  Anesthesia Type:General  Level of Consciousness: awake, alert  and oriented  Airway & Oxygen Therapy: Patient Spontanous Breathing and Patient connected to face mask oxygen  Post-op Assessment: Report given to RN and Post -op Vital signs reviewed and stable  Post vital signs: Reviewed and stable  Last Vitals:  Vitals Value Taken Time  BP    Temp    Pulse 68 10/24/19 1451  Resp    SpO2 95 % 10/24/19 1451  Vitals shown include unvalidated device data.  Last Pain:  Vitals:   10/24/19 1250  TempSrc: Oral  PainSc: 0-No pain      Patients Stated Pain Goal: 5 (28/11/88 6773)  Complications: No apparent anesthesia complications

## 2019-10-24 NOTE — Op Note (Signed)
10/24/2019  2:45 PM  PATIENT:  [name]  78 y.o. female  PRE-OPERATIVE DIAGNOSIS:  left breast cancer  POST-OPERATIVE DIAGNOSIS:  left breast cancer  PROCEDURE:  Procedure(s): LEFT BREAST LUMPECTOMY WITH RADIOACTIVE SEED LOCALIZATION (Left)  SURGEON:  Surgeon(s) and Role:    Jovita Kussmaul, MD - Primary  PHYSICIAN ASSISTANT:   ASSISTANTS: none   ANESTHESIA:   local and general  EBL:  minimal   BLOOD ADMINISTERED:none  DRAINS: none   LOCAL MEDICATIONS USED:  MARCAINE     SPECIMEN:  Source of Specimen:  left breast tissue  DISPOSITION OF SPECIMEN:  PATHOLOGY  COUNTS:  YES  TOURNIQUET:  * No tourniquets in log *  DICTATION: .Dragon Dictation   After informed consent was obtained the patient was brought to the operating room and placed in the supine position on the operating table.  After adequate induction of general anesthesia the patient's left breast was prepped with ChloraPrep, allowed to dry, and draped in usual sterile manner.  An appropriate timeout was performed.  Previously an I-125 seed was placed in the upper outer quadrant of the left breast to mark an area of invasive breast cancer.  The neoprobe was set to I-125 in the area of radioactivity was readily identified far in the outer left breast.  An elliptical incision was then made in the skin overlying the area of radioactivity.  The incision was carried through the skin and subcutaneous tissue sharply with the electrocautery.  A large circular portion of breast tissue was then excised sharply around the radioactive seed while checking the area of radioactivity frequently.  Once the specimen was removed it was oriented with the appropriate paint colors.  A specimen radiograph was obtained that showed the clip and seed to be near the center of the specimen.  The specimen was then sent to pathology for further evaluation.  Hemostasis was achieved using the Bovie electrocautery.  The wound was irrigated with  saline and infiltrated with more quarter percent Marcaine.  The cavity was marked with clips.  The deep layer of the wound was then closed with layers of interrupted 3-0 Vicryl stitches.  The skin was then closed with a running 4-0 Monocryl subcuticular stitch.  Dermabond dressings were applied.  The patient tolerated the procedure well.  At the end of the case all needle sponge and instrument counts were correct.  The patient was then awakened and taken to recovery in stable condition.  PLAN OF CARE: Discharge to home after PACU  PATIENT DISPOSITION:  PACU - hemodynamically stable.   Delay start of Pharmacological VTE agent (>24hrs) due to surgical blood loss or risk of bleeding: not applicable

## 2019-10-24 NOTE — H&P (Signed)
[name]  Location: [facility] Patient #: [id number] DOB: 10/30/1941 Married / Language: English / Race: White Female   History of Present Illness The patient is a 78 year old female who presents with breast cancer. We are asked to see the patient in consultation by Dr. Estella Hernandez to evaluate her for a new left breast cancer. The patient is a 78-year-old white female who thought she felt a mass in the upper outer quadrant of the left breast. She went in for a screening mammogram and was found to have a 1.3 cm mass in this location. The lymph nodes looked normal by ultrasound. The mass was biopsied and came back as an invasive breast cancer that was ER and PR positive and HER-2 negative with a Ki67 of 10%. She does have a history of breast cancer in her sister. She does have an extensive cardiac history with congestive heart failure and myocardial infarction with stent placement. She believes she is followed by Dr. Nahser   Problem List/Past Medical  MALIGNANT NEOPLASM OF UPPER-OUTER QUADRANT OF LEFT BREAST IN FEMALE, ESTROGEN RECEPTOR POSITIVE (C50.412)   Past Surgical History  Breast Biopsy  Left. Cataract Surgery  Bilateral. Hemorrhoidectomy   Diagnostic Studies History  Colonoscopy  1-5 years ago  Allergies  No Known Drug Allergies   Medication History  amLODIPine Besylate (5MG Tablet, Oral) Active. Aspirin (81MG Tablet, Oral) Active. Atorvastatin Calcium (40MG Tablet, Oral) Active. Clopidogrel Bisulfate (75MG Tablet, Oral) Active. Furosemide (40MG Tablet, Oral) Active. Metoprolol Succinate ER (100MG Tablet ER 24HR, Oral) Active. Isosorbide Mononitrate ER (30MG Tablet ER 24HR, Oral) Active. HYDROcodone-Acetaminophen (5-325MG Tablet, Oral) Active. LORazepam (0.5MG Tablet, Oral) Active. Cyanocobalamin (1000MCG/ML Solution, Injection) Active. NovoLIN R (100UNIT/ML Solution, Injection) Active. Potassium Chloride Crys ER (10MEQ  Tablet ER, Oral) Active. Phenergan (12.5MG Tablet, Oral) Active. Medications Reconciled  Other Problems  Arthritis  Back Pain  Cancer  Cervical Cancer  Chronic Renal Failure Syndrome  Congestive Heart Failure  Diabetes Mellitus  High blood pressure  Lump In Breast  Myocardial infarction     Review of Systems  General Not Present- Appetite Loss, Chills, Fatigue, Fever, Night Sweats, Weight Gain and Weight Loss. HEENT Present- Wears glasses/contact lenses. Not Present- Earache, Hearing Loss, Hoarseness, Nose Bleed, Oral Ulcers, Ringing in the Ears, Seasonal Allergies, Sinus Pain, Sore Throat, Visual Disturbances and Yellow Eyes. Respiratory Present- Difficulty Breathing. Not Present- Bloody sputum, Chronic Cough, Snoring and Wheezing. Cardiovascular Present- Shortness of Breath. Not Present- Chest Pain, Difficulty Breathing Lying Down, Leg Cramps, Palpitations, Rapid Heart Rate and Swelling of Extremities. Gastrointestinal Not Present- Abdominal Pain, Bloating, Bloody Stool, Change in Bowel Habits, Chronic diarrhea, Constipation, Difficulty Swallowing, Excessive gas, Gets full quickly at meals, Hemorrhoids, Indigestion, Nausea, Rectal Pain and Vomiting. Female Genitourinary Not Present- Frequency, Nocturia, Painful Urination, Pelvic Pain and Urgency. Musculoskeletal Present- Back Pain and Swelling of Extremities. Not Present- Joint Pain, Joint Stiffness, Muscle Pain and Muscle Weakness. Neurological Not Present- Decreased Memory, Fainting, Headaches, Numbness, Seizures, Tingling, Tremor, Trouble walking and Weakness. Psychiatric Not Present- Anxiety, Bipolar, Change in Sleep Pattern, Depression, Fearful and Frequent crying. Endocrine Present- New Diabetes. Not Present- Cold Intolerance, Excessive Hunger, Hair Changes, Heat Intolerance and Hot flashes.  Vitals  Weight: 184.2 lb Height: 60in Body Surface Area: 1.8 m Body Mass Index: 35.97 kg/m  Temp.: 97.4F   Pulse: 74 (Regular)  BP: 128/88 (Sitting, Left Arm, Standard)       Physical Exam  General Mental Status-Alert. General Appearance-Consistent with stated age. Hydration-Well hydrated. Voice-Normal.    Head and Neck Head-normocephalic, atraumatic with no lesions or palpable masses. Trachea-midline. Thyroid Gland Characteristics - normal size and consistency.  Eye Eyeball - Bilateral-Extraocular movements intact. Sclera/Conjunctiva - Bilateral-No scleral icterus.  Chest and Lung Exam Chest and lung exam reveals -quiet, even and easy respiratory effort with no use of accessory muscles and on auscultation, normal breath sounds, no adventitious sounds and normal vocal resonance. Inspection Chest Wall - Normal. Back - normal.  Breast Note: There is a bruise in the lateral left breast. At times I feel like I might be able to feel a 1 cm mass in the upper outer quadrant of the left breast. There is no palpable mass in the right breast. There is a palpable mobile lymph node in the left axilla but this was felt to be architecturally normal by ultrasound. She also has a port on her right chest wall because of a history of leukemia   Cardiovascular Cardiovascular examination reveals -normal heart sounds, regular rate and rhythm with no murmurs and normal pedal pulses bilaterally.  Abdomen Inspection Inspection of the abdomen reveals - No Hernias. Skin - Scar - no surgical scars. Palpation/Percussion Palpation and Percussion of the abdomen reveal - Soft, Non Tender, No Rebound tenderness, No Rigidity (guarding) and No hepatosplenomegaly. Auscultation Auscultation of the abdomen reveals - Bowel sounds normal.  Neurologic Neurologic evaluation reveals -alert and oriented x 3 with no impairment of recent or remote memory. Mental Status-Normal.  Musculoskeletal Normal Exam - Left-Upper Extremity Strength Normal and Lower Extremity Strength Normal. Normal  Exam - Right-Upper Extremity Strength Normal and Lower Extremity Strength Normal.  Lymphatic Head & Neck  General Head & Neck Lymphatics: Bilateral - Description - Normal. Axillary  General Axillary Region: Bilateral - Description - Normal. Tenderness - Non Tender. Femoral & Inguinal  Generalized Femoral & Inguinal Lymphatics: Bilateral - Description - Normal. Tenderness - Non Tender.    Assessment & Plan  MALIGNANT NEOPLASM OF UPPER-OUTER QUADRANT OF LEFT BREAST IN FEMALE, ESTROGEN RECEPTOR POSITIVE (C50.412) Impression: The patient appears to have a 1.3 cm cancer in the upper outer left breast with radiographically negative lymph nodes. I have talked her in detail about the different options for treatment at this point she favors breast conservation which I think is a reasonable way of treating her cancer. Given her age I do not think she will need a node evaluation. I have discussed with her in detail the risks and benefits of the operation as well as some of the technical aspects including the use of a radioactive seed for localization and she understands and wishes to proceed. We will need cardiac clearance given her extensive cardiac history prior to scheduling the surgery. Current Plans Referred to Oncology, for evaluation and follow up (Oncology). Routine.   

## 2019-10-25 ENCOUNTER — Encounter (HOSPITAL_BASED_OUTPATIENT_CLINIC_OR_DEPARTMENT_OTHER): Payer: Self-pay | Admitting: General Surgery

## 2019-10-26 ENCOUNTER — Encounter: Payer: Self-pay | Admitting: Internal Medicine

## 2019-10-26 ENCOUNTER — Ambulatory Visit (INDEPENDENT_AMBULATORY_CARE_PROVIDER_SITE_OTHER): Payer: Medicare HMO | Admitting: Internal Medicine

## 2019-10-26 ENCOUNTER — Other Ambulatory Visit: Payer: Self-pay

## 2019-10-26 VITALS — BP 126/74 | HR 70 | Ht 62.0 in | Wt 178.4 lb

## 2019-10-26 DIAGNOSIS — Z794 Long term (current) use of insulin: Secondary | ICD-10-CM

## 2019-10-26 DIAGNOSIS — E1165 Type 2 diabetes mellitus with hyperglycemia: Secondary | ICD-10-CM | POA: Diagnosis not present

## 2019-10-26 LAB — POCT GLYCOSYLATED HEMOGLOBIN (HGB A1C): Hemoglobin A1C: 7.7 % — AB (ref 4.0–5.6)

## 2019-10-26 LAB — GLUCOSE, POCT (MANUAL RESULT ENTRY): POC Glucose: 325 mg/dl — AB (ref 70–99)

## 2019-10-26 NOTE — Patient Instructions (Addendum)
-   Decrease Toujeo to 13 units daily  - Continue Novolin-R , 12 units, Take it BEFORE a meal, preferably THIRTY minutes before a meal.       HOW TO TREAT LOW BLOOD SUGARS (Blood sugar LESS THAN 70 MG/DL)  Please follow the RULE OF 15 for the treatment of hypoglycemia treatment (when your (blood sugars are less than 70 mg/dL)    STEP 1: Take 15 grams of carbohydrates when your blood sugar is low, which includes:   3-4 GLUCOSE TABS  OR  3-4 OZ OF JUICE OR REGULAR SODA OR  ONE TUBE OF GLUCOSE GEL     STEP 2: RECHECK blood sugar in 15 MINUTES STEP 3: If your blood sugar is still low at the 15 minute recheck --> then, go back to STEP 1 and treat AGAIN with another 15 grams of carbohydrates.

## 2019-10-26 NOTE — Progress Notes (Signed)
Name: SOMARA FRYMIRE  Age/ Sex: 78 y.o., female   MRN/ DOB: [id number], [birth year]     PCP: Isaac Bliss, Rayford Halsted, MD   Reason for Endocrinology Evaluation: Type 2 Diabetes Mellitus  Initial Endocrine Consultative Visit: 05/04/2019    PATIENT IDENTIFIER: Ms. FELECIA STANFILL is a 78 y.o. female with a past medical history of T2DM, HTN, CAD, CHF , Hx of pancreatitis. The patient has followed with Endocrinology clinic since 05/04/2019 for consultative assistance with management of her diabetes.  DIABETIC HISTORY:  Ms. Whicker was diagnosed with T2DM in 2008, she was on metformin, SU and pioglitazone has been on and started insulin therapy many years ago. Her hemoglobin A1c has ranged from 6.6% in 2015, peaking at 12.4% in 2018.  She continues to work at an [employer] place.   SUBJECTIVE:   During the last visit (07/01/2019): A1c was 7.0 % , we reduced toujeo and continued regular insulin   Today (10/27/2019): Ms. Cotten is here for a 3 month follow up on diabetes management.Since her last visit she has been diagnosed with breast ca recurrence, S/P Left lumpectomy 10/24/2019. She is recovering well overall. She is already back to work. She checks her blood sugars 3-4 times daily. The patient has  had hypoglycemic episodes since the last clinic visit, which typically occur 2 x / month- most often occuring early morning. The patient is symptomatic with these episodes, with symptoms of shaking. Otherwise, the patient hasnot required any recent emergency interventions for hypoglycemia and has not had recent hospitalizations secondary to hyper or hypoglycemic episodes.    ROS: As per HPI and as detailed below: Review of Systems  Constitutional: Negative for chills and fever.  HENT: Negative for congestion and sore throat.   Eyes: Negative for redness.  Respiratory: Negative for cough and shortness of breath.   Cardiovascular: Positive for leg swelling.  Gastrointestinal: Negative for diarrhea and  nausea.      HOME DIABETES REGIMEN:   Toujeo 16 units QHS   Novolin- R  12 units TID QAC     Glucose LOG:  This am 80 mg/dL      HISTORY:  Past Medical History:  Past Medical History:  Diagnosis Date  . acute myeloblastic leukemia (AML) dx'd 06/2014  . Acute myelogenous leukemia ([place]) 02/2014  . Acute pancreatitis   . Arthritis    "back" (09/17/2017)  . Atrial flutter ([place])   . Cardiomyopathy ([place])   . Chronic kidney disease, stage II (mild)   . Chronic lower back pain   . Colon polyps 04/29/2010   TUBULAR ADENOMA AND A SERRATED ADENOMA  . COPD (chronic obstructive pulmonary disease) ([place])   . CORONARY ARTERY DISEASE 12/24/2007  . DIABETES MELLITUS, TYPE II 07/13/2007  . History of blood transfusion 2015   "related to leukemia"  . History of kidney stones 12/24/2007  . HYPERLIPIDEMIA 12/24/2007  . HYPERTENSION 07/13/2007  . NSTEMI (non-ST elevated myocardial infarction) ([place]) 09/17/2017  . Unspecified disease of pancreas   . Uterine cancer [facility])    Past Surgical History:  Past Surgical History:  Procedure Laterality Date  . ABDOMINAL HYSTERECTOMY     "still have my ovaries"  . BREAST LUMPECTOMY WITH RADIOACTIVE SEED LOCALIZATION Left 10/24/2019   Procedure: LEFT BREAST LUMPECTOMY WITH RADIOACTIVE SEED LOCALIZATION;  Surgeon: Jovita Kussmaul, MD;  Location: [place];  Service: General;  Laterality: Left;  . CARDIAC CATHETERIZATION  2002  . CARDIAC CATHETERIZATION  09/17/2017  . CATARACT EXTRACTION W/  INTRAOCULAR LENS  IMPLANT, BILATERAL Bilateral   . COLONOSCOPY W/ BIOPSIES AND POLYPECTOMY  2011  . CORONARY STENT INTERVENTION N/A 09/21/2017   Procedure: CORONARY STENT INTERVENTION;  Surgeon: Troy Sine, MD;  Location: [place] [facility];  Service: Cardiovascular;  Laterality: N/A;  . DILATION AND CURETTAGE OF UTERUS    . EXCISIONAL HEMORRHOIDECTOMY  X 2  . GANGLION CYST EXCISION Left X 3  . INTRAVASCULAR PRESSURE WIRE/FFR STUDY N/A 09/17/2017    Procedure: INTRAVASCULAR PRESSURE WIRE/FFR STUDY;  Surgeon: Nelva Bush, MD;  Location: [place] [facility];  Service: Cardiovascular;  Laterality: N/A;  . NASAL SINUS SURGERY    . PORTA CATH INSERTION Right 2013  . RIGHT/LEFT HEART CATH AND CORONARY ANGIOGRAPHY N/A 09/17/2017   Procedure: RIGHT/LEFT HEART CATH AND CORONARY ANGIOGRAPHY;  Surgeon: Nelva Bush, MD;  Location: [place] [facility];  Service: Cardiovascular;  Laterality: N/A;  . TUBAL LIGATION      Social History:  reports that she quit smoking about 18 years ago. Her smoking use included cigarettes. She has a 20.00 pack-year smoking history. She has never used smokeless tobacco. She reports that she does not drink alcohol or use drugs. Family History:  Family History  Problem Relation Age of Onset  . COPD Father   . Throat cancer Father   . Uterine cancer Sister        uterine ca and breast ca  . Breast cancer Sister   . Throat cancer Brother   . Suicidality Brother   . Congestive Heart Failure Mother   . Heart attack Maternal Grandmother   . Colon cancer Neg Hx      HOME MEDICATIONS: Allergies as of 10/26/2019   No Known Allergies     Medication List       Accurate as of October 26, 2019 11:59 PM. If you have any questions, ask your nurse or doctor.        amLODipine 5 MG tablet Commonly known as: NORVASC TAKE 1 TABLET EVERY DAY   aspirin EC 81 MG tablet Take 1 tablet (81 mg total) by mouth daily.   atorvastatin 40 MG tablet Commonly known as: LIPITOR TAKE 1 TABLET EVERY DAY   BD Veo Insulin Syringe U/F 31G X 15/64" 0.3 ML Misc Generic drug: Insulin Syringe-Needle U-100 USE THREE TIMES DAILY   clopidogrel 75 MG tablet Commonly known as: PLAVIX TAKE 1 TABLET EVERY EVENING   cyanocobalamin 1000 MCG/ML injection Commonly known as: (VITAMIN B-12) ADMINISTER 1 ML(1000 MCG) IN THE MUSCLE 1 TIME FOR 1 DOSE   fluticasone furoate-vilanterol 200-25 MCG/INH Aepb Commonly known as: Breo  Ellipta Inhale 1 puff into the lungs daily.   furosemide 40 MG tablet Commonly known as: LASIX Take 1.5 tablets (60 mg total) by mouth daily.   glucose blood test strip Commonly known as: True Metrix Blood Glucose Test Use to test glucose 3 times daily. Dx e11.9  Z79.4 - [employer]   HYDROcodone-acetaminophen 5-325 MG tablet Commonly known as: NORCO/VICODIN Take 1 tablet by mouth every 6 (six) hours as needed for moderate pain.   HYDROcodone-acetaminophen 5-325 MG tablet Commonly known as: NORCO/VICODIN Take 1-2 tablets by mouth every 4 (four) hours as needed for moderate pain or severe pain.   HYDROcodone-acetaminophen 5-325 MG tablet Commonly known as: NORCO/VICODIN Take 1-2 tablets by mouth every 4 (four) hours as needed for moderate pain or severe pain.   Insulin Glargine (2 Unit Dial) 300 UNIT/ML Sopn Commonly known as: Toujeo Max SoloStar Inject 18 Units  into the skin at bedtime.   insulin regular 100 units/mL injection Commonly known as: NovoLIN R Inject 0.12 mLs (12 Units total) into the skin 3 (three) times daily before meals. INJECT 10 UNITS SUBCUTANEOUSLY 3 TIMES DAILY. INJECT 4 EXTRA UNITS IF CBG OVER 200. INJECT 6 EXTRA UNITS IF CBG OVER 300   isosorbide mononitrate 30 MG 24 hr tablet Commonly known as: IMDUR TAKE 1 AND 1/2 TABLETS EVERY DAY   LORazepam 0.5 MG tablet Commonly known as: ATIVAN TAKE 1 TABLET BY MOUTH EVERY 8 HOURS AS NEEDED FOR ANXIETY(DO NOT EXCEED MORE THAN 2 TABS IN 24 HRS)   MAGNESIUM OXIDE 400 PO Take 400 mg by mouth 2 (two) times daily.   metoprolol succinate 100 MG 24 hr tablet Commonly known as: TOPROL-XL TAKE 1 TABLET EVERY DAY  WITH  OR  IMMEDIATELY FOLLOWING A MEAL   nitroGLYCERIN 0.4 MG SL tablet Commonly known as: NITROSTAT Place 1 tablet (0.4 mg total) under the tongue every 5 (five) minutes as needed for chest pain.   Pen Needles 3/16" 31G X 5 MM Misc Use with Novolin R   polyethylene glycol 17 g packet Commonly known as:  MIRALAX / GLYCOLAX Take 17 g by mouth as needed.   potassium chloride 10 MEQ tablet Commonly known as: KLOR-CON Take 1 tablet (10 mEq total) by mouth daily.   promethazine 12.5 MG tablet Commonly known as: PHENERGAN Take 2 tablets (25 mg total) by mouth every 6 (six) hours as needed for nausea.   SYRINGE/NEEDLE (DISP) 1 ML 25G X 5/8" 1 ML Misc Commonly known as: BD Eclipse Syringe Use for b12 injections   True Metrix Go Glucose Meter w/Device Kit 30 Units by Does not apply route daily.        OBJECTIVE:   Vital Signs: BP 126/74   Pulse 70   Ht '5\' 2"'$  (1.575 m)   Wt 178 lb 6.4 oz (80.9 kg)   SpO2 96%   BMI 32.63 kg/m   Wt Readings from Last 3 Encounters:  10/26/19 178 lb 6.4 oz (80.9 kg)  10/24/19 171 lb 15.3 oz (78 kg)  09/07/19 174 lb (78.9 kg)     Exam: General: Pt appears well and is in NAD  Lungs: Clear with good BS bilat with no rales, rhonchi, or wheezes  Heart: RRR with normal S1 and S2 and no gallops; no murmurs; no rub  Abdomen: Normoactive bowel sounds, soft, nontender, without masses or organomegaly palpable  Extremities: 1+  pretibial edema.   Neuro: MS is good with appropriate affect, pt is alert and Ox3       DM foot exam: 05/04/2019 The skin of the feet is intact without sores or ulcerations. The pedal pulses are 2+ on right and 2+ on left. The sensation is intact to a screening 5.07, 10 gram monofilament bilaterally       DATA REVIEWED:  Lab Results  Component Value Date   HGBA1C 7.7 (A) 10/26/2019   HGBA1C 7.0 (A) 07/01/2019   HGBA1C 9.3 (H) 03/29/2019   Lab Results  Component Value Date   MICROALBUR 43.5 (H) 10/27/2018   LDLCALC 60 02/17/2018   CREATININE 1.26 (H) 10/20/2019   Lab Results  Component Value Date   MICRALBCREAT 101.4 (H) 10/27/2018     Lab Results  Component Value Date   CHOL 130 02/17/2018   HDL 38 (L) 02/17/2018   LDLCALC 60 02/17/2018   TRIG 161 (H) 02/17/2018   CHOLHDL 3.4 02/17/2018  In-office  BG 325 mg/dL   ASSESSMENT / PLAN / RECOMMENDATIONS:   1) Type 2 Diabetes Mellitus, Sub- Optimally controlled, With CKD III and Macrovascular complications - Most recent A1c of 7.7 %. Goal A1c < 7.5 %.     - Pt with mild hyperglycemia, she tends to take regular insulin after a meal which has resulted in hypoglycemia, she also tends to forget her prandial or not take it when her sugar is below 100 mg/dL  - I will reduce her basal insulin as she tends to be low or tight in morning, I will not make changes to her prandial dose, she was advised to take 50% of the prandial dose if her BG is < 100 mg/dL.  - This morning her Bg was 80 mg/dL, she  Did not take the prandial, ate lunch and took regular insulin after her meal, unknown pre-lunch BG , by the time she came to our office her BG was 325 mg/dL  - She has declined a referral to our CDE in the past.  - Overall I believe her glucose is at acceptable control giving the circumstances of  Recent sx for breaast Ca and husband who just broke his hip     MEDICATIONS:  Decrease Toujeo to 13 units daily   Continue Novolin-R at 12 units [facility]  EDUCATION / INSTRUCTIONS:  BG monitoring instructions: Patient is instructed to check her blood sugars 3-4 times a day, before meals and bedtime .  Call [facility] if: BG persistently < 70 or > 300. . I reviewed the Rule of 15 for the treatment of hypoglycemia in detail with the patient. Literature supplied.    F/U in 3 months    Signed electronically by: Mack Guise, MD  [facility]  [address] [place], [place] [address] Phone: [phone] FAX: [phone]   CC: Isaac Bliss, Rayford Halsted, MD [place] [address] Phone: [phone]  Fax: [phone]  Return to Endocrinology clinic as below: Future Appointments  Date Time Provider [place]  11/04/2019 10:30 AM [facility]  LAB 2 [facility] None  11/04/2019 10:45 AM [facility] [place] FLUSH [facility] None  11/04/2019 11:00 AM Ladell Pier, MD [facility] None  11/24/2019  4:00 PM Isaac Bliss, Rayford Halsted, MD [facility]  01/31/2020  3:40 PM , Melanie Crazier, MD [facility] None

## 2019-10-27 ENCOUNTER — Telehealth: Payer: Self-pay | Admitting: Oncology

## 2019-10-27 NOTE — Telephone Encounter (Signed)
Called patient regarding new rescheduled appointments, patient is notified.

## 2019-11-01 ENCOUNTER — Telehealth: Payer: Self-pay | Admitting: Internal Medicine

## 2019-11-01 ENCOUNTER — Telehealth: Payer: Self-pay | Admitting: *Deleted

## 2019-11-01 NOTE — Telephone Encounter (Signed)
Medication Refill - Medication:  HYDROcodone-acetaminophen (NORCO/VICODIN) 5-325 MG tablet [[id number]]    30 day supply   Preferred Pharmacy(with phone number or street name):  [facility], [address]  [place] [address]  Phone: [phone] Fax: [phone]    Agent: Please be advised that RX refills may take up to 3 business days. We ask that you follow-up with your pharmacy.

## 2019-11-01 NOTE — Telephone Encounter (Signed)
error 

## 2019-11-02 ENCOUNTER — Inpatient Hospital Stay: Payer: Medicare HMO

## 2019-11-02 ENCOUNTER — Inpatient Hospital Stay (HOSPITAL_BASED_OUTPATIENT_CLINIC_OR_DEPARTMENT_OTHER): Payer: Medicare HMO | Admitting: Oncology

## 2019-11-02 ENCOUNTER — Telehealth: Payer: Self-pay | Admitting: Oncology

## 2019-11-02 ENCOUNTER — Encounter: Payer: Self-pay | Admitting: Radiation Oncology

## 2019-11-02 ENCOUNTER — Telehealth: Payer: Self-pay | Admitting: *Deleted

## 2019-11-02 ENCOUNTER — Other Ambulatory Visit: Payer: Self-pay

## 2019-11-02 ENCOUNTER — Inpatient Hospital Stay: Payer: Medicare HMO | Attending: Oncology

## 2019-11-02 VITALS — BP 163/60 | HR 70 | Temp 98.9°F | Resp 17 | Ht 62.0 in | Wt 174.2 lb

## 2019-11-02 DIAGNOSIS — C50412 Malignant neoplasm of upper-outer quadrant of left female breast: Secondary | ICD-10-CM | POA: Insufficient documentation

## 2019-11-02 DIAGNOSIS — E119 Type 2 diabetes mellitus without complications: Secondary | ICD-10-CM | POA: Diagnosis not present

## 2019-11-02 DIAGNOSIS — C9501 Acute leukemia of unspecified cell type, in remission: Secondary | ICD-10-CM

## 2019-11-02 DIAGNOSIS — Z170 Estrogen receptor positive status [ER+]: Secondary | ICD-10-CM | POA: Diagnosis not present

## 2019-11-02 DIAGNOSIS — Z79899 Other long term (current) drug therapy: Secondary | ICD-10-CM | POA: Diagnosis not present

## 2019-11-02 DIAGNOSIS — I4891 Unspecified atrial fibrillation: Secondary | ICD-10-CM | POA: Insufficient documentation

## 2019-11-02 DIAGNOSIS — N289 Disorder of kidney and ureter, unspecified: Secondary | ICD-10-CM | POA: Diagnosis not present

## 2019-11-02 DIAGNOSIS — Z9221 Personal history of antineoplastic chemotherapy: Secondary | ICD-10-CM | POA: Diagnosis not present

## 2019-11-02 DIAGNOSIS — C9201 Acute myeloblastic leukemia, in remission: Secondary | ICD-10-CM | POA: Diagnosis not present

## 2019-11-02 DIAGNOSIS — Z95828 Presence of other vascular implants and grafts: Secondary | ICD-10-CM

## 2019-11-02 LAB — SURGICAL PATHOLOGY

## 2019-11-02 MED ORDER — SODIUM CHLORIDE 0.9% FLUSH
10.0000 mL | Freq: Once | INTRAVENOUS | Status: AC
  Administered 2019-11-02: 10:00:00 10 mL via INTRAVENOUS
  Filled 2019-11-02: qty 10

## 2019-11-02 MED ORDER — CALTRATE 600+D PLUS MINERALS 600-800 MG-UNIT PO TABS: 1.0000 | ORAL_TABLET | Freq: Every day | ORAL | Status: DC

## 2019-11-02 MED ORDER — LETROZOLE 2.5 MG PO TABS: 2.5000 mg | ORAL_TABLET | Freq: Every day | ORAL | 3 refills | Status: DC

## 2019-11-02 MED ORDER — HEPARIN SOD (PORK) LOCK FLUSH 100 UNIT/ML IV SOLN
500.0000 [IU] | Freq: Once | INTRAVENOUS | Status: AC | PRN
  Administered 2019-11-02: 500 [IU] via INTRAVENOUS
  Filled 2019-11-02: qty 5

## 2019-11-02 MED ORDER — SODIUM CHLORIDE 0.9 % IJ SOLN: 10.0000 mL | INTRAMUSCULAR | Status: DC | PRN

## 2019-11-02 NOTE — Progress Notes (Signed)
[name]  has a 1.5 cm grade 2, ER positive, PR positive, HER-2 negative tumor with negative margins, associated high-grade DCIS, no lymph nodes were sampled  She has multiple comorbid conditions including CHF, COPD, and diabetes  She agrees to adjuvant letrozole.   Given all of her comorbidities, I recommend letrozole alone. I just called her and let her know that I think the benefits of radiation would not be very great and I would not want to expose her to the  potential side effects of radiation given that I think her prognosis will be very good with the letrozole as her adjuvant treatment.  She is pleased with this plan and happy to proceed with taking antiestrogens alone.  I let medical oncology know; I will see her on an as needed basis.     -----------------------------------

## 2019-11-02 NOTE — Telephone Encounter (Signed)
Scheduled appt per 11/25 sch message - pt is aware of apt date and time   

## 2019-11-02 NOTE — Telephone Encounter (Signed)
Notified patient that Dr. [name] wants her to start letrozole 2.5 mg daily and calcium w/vitamin D (600mg /800units). She understands and agrees. Letrozole sent to [place] per her request. Also informed her that he has ordered a baseline bone density scan for her and she will be called to schedule this.

## 2019-11-02 NOTE — Telephone Encounter (Signed)
Scheduled per los. Gave avs and calendar  

## 2019-11-02 NOTE — Progress Notes (Signed)
[place] OFFICE PROGRESS NOTE   Diagnosis: AML, breast cancer  INTERVAL HISTORY:   [name] palpated a lump in the left breast.  Mammogram on 09/20/2019 revealed a mass in the posterior third of the upper outer left breast.  A 1 cm mass was palpated at the 2 o'clock position of the left breast.  An ultrasound confirmed a hypoechoic 1.2 x 0.9 x 1.3 cm mass.  The left axilla had no lymphadenopathy. She underwent ultrasound-guided biopsy of the left breast mass on 09/22/2019 revealed invasive ductal carcinoma measuring 13 mm, grade 2.  The tumor returned ER positive, PR positive, and HER-2 negative.  Ki-67 marker returned to 10%.  She was referred to Dr. Marlou Starks and underwent a left breast lumpectomy with radioactive seed localization on 10/24/2019.  The pathology revealed a 1.5 cm invasive ductal carcinoma, grade 2 with associated high-grade DCIS with necrosis.  The resection margins were negative.  [name] reports feeling well.  She has chronic leg edema right greater than left.  She is scheduled for follow-up at [facility] in March. Objective:  Vital signs in last 24 hours:  Blood pressure (!) 163/60, pulse 70, temperature 98.9 F (37.2 C), temperature source Temporal, resp. rate 17, height '5\' 2"'$  (1.575 m), weight 174 lb 3.2 oz (79 kg), SpO2 100 %.    HEENT: Neck without mass Lymphatics: No cervical, supraclavicular, axillary, or inguinal nodes Resp: Lungs clear bilaterally Cardio: Regular rate and rhythm GI: No hepatosplenomegaly Vascular: Pitting edema at the right greater than left lower leg Breast: Right breast without mass, status post left lumpectomy with a healing incision, no mass  Port-A-Cath without erythema.  Lab Results:  Lab Results  Component Value Date   WBC 6.6 03/29/2019   HGB 11.8 (L) 03/29/2019   HCT 35.6 (L) 03/29/2019   MCV 95.2 03/29/2019   PLT 121 (L) 03/29/2019   NEUTROABS 4.1 03/29/2019    CMP  Lab Results  Component Value  Date   NA 137 10/20/2019   K 4.0 10/20/2019   CL 103 10/20/2019   CO2 24 10/20/2019   GLUCOSE 355 (H) 10/20/2019   BUN 15 10/20/2019   CREATININE 1.26 (H) 10/20/2019   CALCIUM 7.9 (L) 10/20/2019   PROT 5.8 (L) 03/29/2019   ALBUMIN 2.9 (L) 03/29/2019   AST 24 03/29/2019   ALT 21 03/29/2019   ALKPHOS 190 (H) 03/29/2019   BILITOT 0.6 03/29/2019   GFRNONAA 41 (L) 10/20/2019   GFRAA 48 (L) 10/20/2019     Medications: I have reviewed the patient's current medications.   Assessment/Plan: 1. 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  Status post cycle 3 high-dose araC consolidation 08/22/2014.  Bone marrow 09/15/2014 showed necrotic marrow with minute focus of myeloid precursors. No evidence of viable acute leukemia. 2.History of pancytopenia secondary to chemotherapy.  3. C. difficile colitis-06/09/2014 treated with Flagyl  4. Diabetes  5. renal insufficiency  6. history of hypokalemia and hypomagnesemia. 7. history of atrial fibrillation  9. history of coronary artery disease  10. Hospitalization with febrile neutropenia/sepsis secondary to Escherichia coli bacteremia 09/02/2014. 11. Shingles involving the right back October 2015. 12. Meningioma on brain CT 05/24/2014 and brain MRI 05/24/2014 13. Hospitalization09/17/2018 through 08/28/2017 with acute  CHF. 14.Admission 11/21/2017 with expressive aphasia, seizure activity noted on EEG, placed on Keppra 15.  Left breast cancer, T1NX, status post a left lumpectomy 1116 2020-1.5 cm, grade 2, associated with high-grade DCIS with necrosis, ER 95%, PR 95%, HER-2 negative, KI-6710%     Disposition: [name] is in clinical remission from AML.  She continues follow-up for the AML at [facility].  She would like to leave the Port-A-Cath in place.  We will check a CBC when she returns for a Port-A-Cath flush in January.    [name] has been diagnosed with early stage left-sided breast cancer.  She has a good prognosis for a long-term disease-free survival.  The tumor is high level hormone receptor positive.  We discussed the expected benefit of adjuvant hormonal therapy with a decrease in the systemic relapse rate and development of contralateral breast cancer.  I recommend adjuvant aromatase inhibitor therapy.  She agrees.  We reviewed potential toxicities associated with aromatase inhibitors including the chance of hot flashes, arthralgias, decreased bone density, and hyperlipidemia.  I recommend she take calcium and vitamin D while on an aromatase inhibitor.  The plan is to begin letrozole.  She saw Dr. Isidore Moos prior to surgery.  I will consult with Dr. Isidore Moos regarding the indication for radiation.  [name] will return for an office visit and Port-A-Cath flush in approximately 6 weeks.  Betsy Coder, MD  11/02/2019  10:34 AM

## 2019-11-04 ENCOUNTER — Other Ambulatory Visit: Payer: Medicare HMO

## 2019-11-04 ENCOUNTER — Ambulatory Visit: Payer: Medicare HMO | Admitting: Oncology

## 2019-11-09 NOTE — Telephone Encounter (Signed)
Patient called again to check on status of med refill. Please advise.

## 2019-11-10 ENCOUNTER — Other Ambulatory Visit: Payer: Self-pay | Admitting: Internal Medicine

## 2019-11-10 DIAGNOSIS — G8929 Other chronic pain: Secondary | ICD-10-CM

## 2019-11-10 DIAGNOSIS — I10 Essential (primary) hypertension: Secondary | ICD-10-CM

## 2019-11-10 MED ORDER — HYDROCODONE-ACETAMINOPHEN 5-325 MG PO TABS: 1.0000 | ORAL_TABLET | Freq: Four times a day (QID) | ORAL | 0 refills | Status: DC | PRN

## 2019-11-10 NOTE — Telephone Encounter (Signed)
Forwarding to PCP for approval  

## 2019-11-10 NOTE — Telephone Encounter (Signed)
Controlled substance refills require check in every 3 months. This can be done virtually. If she runs out before we can fit her in I am willing to give her up to a weeks worth to cover her.

## 2019-11-10 NOTE — Telephone Encounter (Signed)
done

## 2019-11-10 NOTE — Telephone Encounter (Signed)
Spoke with patient she has an appointment 11/15/2019. Can you refill her enough till then

## 2019-11-10 NOTE — Telephone Encounter (Signed)
Pt called back to check status of refill, wants refill as soon as possible.

## 2019-11-10 NOTE — Telephone Encounter (Signed)
See note

## 2019-11-11 NOTE — Telephone Encounter (Signed)
Noted. Patient is aware. Nothing further needed. 

## 2019-11-15 ENCOUNTER — Encounter: Payer: Self-pay | Admitting: *Deleted

## 2019-11-15 ENCOUNTER — Other Ambulatory Visit: Payer: Self-pay

## 2019-11-15 ENCOUNTER — Telehealth (INDEPENDENT_AMBULATORY_CARE_PROVIDER_SITE_OTHER): Payer: Medicare HMO | Admitting: Internal Medicine

## 2019-11-15 DIAGNOSIS — I2510 Atherosclerotic heart disease of native coronary artery without angina pectoris: Secondary | ICD-10-CM | POA: Diagnosis not present

## 2019-11-15 DIAGNOSIS — G8929 Other chronic pain: Secondary | ICD-10-CM | POA: Diagnosis not present

## 2019-11-15 DIAGNOSIS — M549 Dorsalgia, unspecified: Secondary | ICD-10-CM | POA: Diagnosis not present

## 2019-11-15 DIAGNOSIS — F419 Anxiety disorder, unspecified: Secondary | ICD-10-CM | POA: Diagnosis not present

## 2019-11-15 DIAGNOSIS — M542 Cervicalgia: Secondary | ICD-10-CM | POA: Diagnosis not present

## 2019-11-15 MED ORDER — HYDROCODONE-ACETAMINOPHEN 5-325 MG PO TABS: 1.0000 | ORAL_TABLET | Freq: Four times a day (QID) | ORAL | 0 refills | Status: DC | PRN

## 2019-11-15 MED ORDER — POTASSIUM CHLORIDE CRYS ER 10 MEQ PO TBCR: 10.0000 meq | EXTENDED_RELEASE_TABLET | Freq: Every day | ORAL | 3 refills | Status: DC

## 2019-11-15 MED ORDER — FUROSEMIDE 40 MG PO TABS: 60.0000 mg | ORAL_TABLET | Freq: Every day | ORAL | 3 refills | Status: DC

## 2019-11-15 MED ORDER — LORAZEPAM 0.5 MG PO TABS: ORAL_TABLET | ORAL | 2 refills | Status: DC

## 2019-11-15 NOTE — Progress Notes (Signed)
Virtual Visit via Telephone Note  I connected with [name] on 11/15/19 at  3:00 PM EST by telephone and verified that I am speaking with the correct person using two identifiers.   I discussed the limitations, risks, security and privacy concerns of performing an evaluation and management service by telephone and the availability of in person appointments. I also discussed with the patient that there may be a patient responsible charge related to this service. The patient expressed understanding and agreed to proceed.  Location patient: home Location provider: work office Participants present for the call: patient, provider Patient did not have a visit in the prior 7 days to address this/these issue(s).   History of Present Illness:  Scheduled visit for medication refills per opioid contract. Since I last saw her, she was diagnosed with breast cancer. Had a lumpectomy and has started letrozole. Following with Dr. Learta Codding for this. She is on hydrocodone that she takes q6hours for chronic back pain and lorazapam 0.5 mg BID for anxiety. Doing well on these doses. Has not had increased pain/anxiety requirements with her breast cancer diagnosis and treatment.   Observations/Objective: Patient sounds cheerful and well on the phone. I do not appreciate any increased work of breathing. Speech and thought processing are grossly intact. Patient reported vitals: none reported   Current Outpatient Medications:  .  amLODipine (NORVASC) 5 MG tablet, TAKE 1 TABLET EVERY DAY, Disp: 90 tablet, Rfl: 1 .  aspirin EC 81 MG tablet, Take 1 tablet (81 mg total) by mouth daily., Disp: , Rfl:  .  atorvastatin (LIPITOR) 40 MG tablet, TAKE 1 TABLET EVERY DAY, Disp: 90 tablet, Rfl: 1 .  BD VEO INSULIN SYRINGE U/F 31G X 15/64" 0.3 ML MISC, USE THREE TIMES DAILY, Disp: 100 each, Rfl: 1 .  Blood Glucose Monitoring Suppl (TRUE METRIX GO GLUCOSE METER) w/Device KIT, 30 Units by Does not apply route daily.,  Disp: 1 kit, Rfl: 0 .  Calcium Carbonate-Vit D-Min (CALTRATE 600+D PLUS MINERALS) 600-800 MG-UNIT TABS, Take 1 tablet by mouth daily., Disp: , Rfl:  .  clopidogrel (PLAVIX) 75 MG tablet, TAKE 1 TABLET EVERY EVENING, Disp: 90 tablet, Rfl: 1 .  cyanocobalamin (,VITAMIN B-12,) 1000 MCG/ML injection, ADMINISTER 1 ML(1000 MCG) IN THE MUSCLE 1 TIME FOR 1 DOSE, Disp: 10 mL, Rfl: 10 .  fluticasone furoate-vilanterol (BREO ELLIPTA) 200-25 MCG/INH AEPB, Inhale 1 puff into the lungs daily., Disp: 120 each, Rfl: 5 .  furosemide (LASIX) 40 MG tablet, Take 1.5 tablets (60 mg total) by mouth daily., Disp: 180 tablet, Rfl: 3 .  glucose blood (TRUE METRIX BLOOD GLUCOSE TEST) test strip, Use to test glucose 3 times daily. Dx e11.9  Z79.4 - [employer], Disp: 300 each, Rfl: 12 .  HYDROcodone-acetaminophen (NORCO) 5-325 MG tablet, Take 1 tablet by mouth every 6 (six) hours as needed for moderate pain., Disp: 120 tablet, Rfl: 0 .  HYDROcodone-acetaminophen (NORCO) 5-325 MG tablet, Take 1 tablet by mouth every 6 (six) hours as needed for moderate pain., Disp: 120 tablet, Rfl: 0 .  HYDROcodone-acetaminophen (NORCO) 5-325 MG tablet, Take 1 tablet by mouth every 6 (six) hours as needed for moderate pain., Disp: 120 tablet, Rfl: 0 .  Insulin Glargine, 2 Unit Dial, (TOUJEO MAX SOLOSTAR) 300 UNIT/ML SOPN, Inject 18 Units into the skin at bedtime., Disp: 18 mL, Rfl: 3 .  Insulin Pen Needle (PEN NEEDLES 3/16") 31G X 5 MM MISC, Use with Novolin R, Disp: 100 each, Rfl: 0 .  insulin  regular (NOVOLIN R) 100 units/mL injection, Inject 0.12 mLs (12 Units total) into the skin 3 (three) times daily before meals. INJECT 10 UNITS SUBCUTANEOUSLY 3 TIMES DAILY. INJECT 4 EXTRA UNITS IF CBG OVER 200. INJECT 6 EXTRA UNITS IF CBG OVER 300, Disp: 40 mL, Rfl: 3 .  isosorbide mononitrate (IMDUR) 30 MG 24 hr tablet, TAKE 1 AND 1/2 TABLETS EVERY DAY, Disp: 135 tablet, Rfl: 1 .  letrozole (FEMARA) 2.5 MG tablet, Take 1 tablet (2.5 mg total) by mouth daily.,  Disp: 90 tablet, Rfl: 3 .  LORazepam (ATIVAN) 0.5 MG tablet, TAKE 1 TABLET BY MOUTH EVERY 8 HOURS AS NEEDED FOR ANXIETY(DO NOT EXCEED MORE THAN 2 TABS IN 24 HRS), Disp: 60 tablet, Rfl: 2 .  MAGNESIUM OXIDE 400 PO, Take 400 mg by mouth 2 (two) times daily., Disp: , Rfl:  .  metoprolol succinate (TOPROL-XL) 100 MG 24 hr tablet, TAKE 1 TABLET EVERY DAY  WITH  OR  IMMEDIATELY FOLLOWING A MEAL, Disp: 90 tablet, Rfl: 1 .  nitroGLYCERIN (NITROSTAT) 0.4 MG SL tablet, Place 1 tablet (0.4 mg total) under the tongue every 5 (five) minutes as needed for chest pain. (Patient not taking: Reported on 11/02/2019), Disp: 24 tablet, Rfl: 1 .  polyethylene glycol (MIRALAX / GLYCOLAX) 17 g packet, Take 17 g by mouth as needed., Disp: , Rfl:  .  potassium chloride (KLOR-CON) 10 MEQ tablet, Take 1 tablet (10 mEq total) by mouth daily., Disp: 90 tablet, Rfl: 3 .  promethazine (PHENERGAN) 12.5 MG tablet, Take 2 tablets (25 mg total) by mouth every 6 (six) hours as needed for nausea., Disp: 12 tablet, Rfl: 0 .  SYRINGE/NEEDLE, DISP, 1 ML (BD ECLIPSE SYRINGE) 25G X 5/8" 1 ML MISC, Use for b12 injections, Disp: 100 each, Rfl: 3 No current facility-administered medications for this visit.   Facility-Administered Medications Ordered in Other Visits:  .  sodium chloride 0.9 % 1,000 mL with magnesium sulfate 2 g infusion, , Intravenous, Continuous, Ladell Pier, MD, Stopped at 08/04/14 1441  Review of Systems:  Constitutional: Denies fever, chills, diaphoresis, appetite change and fatigue.  HEENT: Denies photophobia, eye pain, redness, hearing loss, ear pain, congestion, sore throat, rhinorrhea, sneezing, mouth sores, trouble swallowing, neck pain, neck stiffness and tinnitus.   Respiratory: Denies SOB, DOE, cough, chest tightness,  and wheezing.   Cardiovascular: Denies chest pain, palpitations and leg swelling.  Gastrointestinal: Denies nausea, vomiting, abdominal pain, diarrhea, constipation, blood in stool and  abdominal distention.  Genitourinary: Denies dysuria, urgency, frequency, hematuria, flank pain and difficulty urinating.  Endocrine: Denies: hot or cold intolerance, sweats, changes in hair or nails, polyuria, polydipsia. Musculoskeletal: Denies myalgias, back pain, joint swelling, arthralgias and gait problem.  Skin: Denies pallor, rash and wound.  Neurological: Denies dizziness, seizures, syncope, weakness, light-headedness, numbness and headaches.  Hematological: Denies adenopathy. Easy bruising, personal or family bleeding history  Psychiatric/Behavioral: Denies suicidal ideation, mood changes, confusion, nervousness, sleep disturbance and agitation   Assessment and Plan:  Anxiety -Refill lorazepam x 3 months per contract.  Cervical spine pain  Chronic bilateral back pain, unspecified back location -PDMP reviewed, no red flags, ORS 160. -Refill hydrocodone 5/325 mg #120 tabs per month x 3 months.  Coronary artery disease involving native coronary artery of native heart without angina pectoris  -She is requesting lasix and KCl be sent to [facility] mail order pharmacy. Done.    I discussed the assessment and treatment plan with the patient. The patient was provided an opportunity to ask questions  and all were answered. The patient agreed with the plan and demonstrated an understanding of the instructions.   The patient was advised to call back or seek an in-person evaluation if the symptoms worsen or if the condition fails to improve as anticipated.  I provided 13 minutes of non-face-to-face time during this encounter.   Lelon Frohlich, MD [facility]

## 2019-11-18 ENCOUNTER — Telehealth: Payer: Self-pay | Admitting: Genetic Counselor

## 2019-11-18 NOTE — Telephone Encounter (Signed)
Called patient regarding upcoming virtual visit, patient is notified. Labs cancelled.

## 2019-11-24 ENCOUNTER — Inpatient Hospital Stay: Payer: Medicare HMO | Admitting: Genetic Counselor

## 2019-11-24 ENCOUNTER — Ambulatory Visit: Payer: Medicare HMO | Admitting: Internal Medicine

## 2019-11-24 ENCOUNTER — Other Ambulatory Visit: Payer: Medicare HMO

## 2019-11-25 ENCOUNTER — Other Ambulatory Visit: Payer: Self-pay | Admitting: Internal Medicine

## 2019-11-25 DIAGNOSIS — I10 Essential (primary) hypertension: Secondary | ICD-10-CM

## 2019-12-06 ENCOUNTER — Inpatient Hospital Stay: Payer: Medicare HMO | Attending: Oncology | Admitting: Genetic Counselor

## 2019-12-06 ENCOUNTER — Other Ambulatory Visit: Payer: Self-pay

## 2019-12-06 DIAGNOSIS — C9201 Acute myeloblastic leukemia, in remission: Secondary | ICD-10-CM | POA: Diagnosis not present

## 2019-12-06 DIAGNOSIS — Z8542 Personal history of malignant neoplasm of other parts of uterus: Secondary | ICD-10-CM

## 2019-12-06 DIAGNOSIS — Z8049 Family history of malignant neoplasm of other genital organs: Secondary | ICD-10-CM

## 2019-12-06 DIAGNOSIS — Z800 Family history of malignant neoplasm of digestive organs: Secondary | ICD-10-CM | POA: Diagnosis not present

## 2019-12-06 DIAGNOSIS — C50412 Malignant neoplasm of upper-outer quadrant of left female breast: Secondary | ICD-10-CM

## 2019-12-06 DIAGNOSIS — Z808 Family history of malignant neoplasm of other organs or systems: Secondary | ICD-10-CM

## 2019-12-06 DIAGNOSIS — Z170 Estrogen receptor positive status [ER+]: Secondary | ICD-10-CM

## 2019-12-06 DIAGNOSIS — Z801 Family history of malignant neoplasm of trachea, bronchus and lung: Secondary | ICD-10-CM | POA: Diagnosis not present

## 2019-12-06 DIAGNOSIS — Z806 Family history of leukemia: Secondary | ICD-10-CM

## 2019-12-07 ENCOUNTER — Encounter: Payer: Self-pay | Admitting: Genetic Counselor

## 2019-12-07 DIAGNOSIS — Z8049 Family history of malignant neoplasm of other genital organs: Secondary | ICD-10-CM | POA: Insufficient documentation

## 2019-12-07 DIAGNOSIS — Z806 Family history of leukemia: Secondary | ICD-10-CM | POA: Insufficient documentation

## 2019-12-07 DIAGNOSIS — Z800 Family history of malignant neoplasm of digestive organs: Secondary | ICD-10-CM | POA: Insufficient documentation

## 2019-12-07 DIAGNOSIS — Z801 Family history of malignant neoplasm of trachea, bronchus and lung: Secondary | ICD-10-CM | POA: Insufficient documentation

## 2019-12-07 DIAGNOSIS — Z8542 Personal history of malignant neoplasm of other parts of uterus: Secondary | ICD-10-CM | POA: Insufficient documentation

## 2019-12-07 DIAGNOSIS — Z808 Family history of malignant neoplasm of other organs or systems: Secondary | ICD-10-CM | POA: Insufficient documentation

## 2019-12-07 NOTE — Progress Notes (Signed)
REFERRING PROVIDER: Ladell Pier, MD [address] [place],  [place] [address]  PRIMARY PROVIDER:  Isaac Bliss, Rayford Halsted, MD  PRIMARY REASON FOR VISIT:  1. Carcinoma of upper-outer quadrant of left breast in female, estrogen receptor positive ([place])   2. Acute myeloid leukemia in remission ([place] [facility])   3. Family history of esophageal cancer   4. Family history of lung cancer   5. Family history of cervical cancer   6. Family history of skin cancer   7. Family history of leukemia   8. History of uterine cancer      HISTORY OF PRESENT ILLNESS:   [name], a 78 y.o. female, was seen for a [facility] cancer genetics consultation at the request of Dr. [name] due to a personal and family history of breast cancer.  [name] presents to clinic today to discuss the possibility of a hereditary predisposition to cancer, genetic testing, and to further clarify her future cancer risks, as well as potential cancer risks for family members.   In 1974 or 1975, at the age of 35 or 60, [name] was diagnosed with uterine cancer, which was treated with surgery (hysterectomy) only. Pathology records were not available for review.   In 2015, at the age of 3, [name] was diagnosed with acute myeloid leukemia (AML), which is now in remission.   In 2020, at the age of 88, [name] was diagnosed with DCIS and IDC, ER+/PR+/Her2-, of the left breast.    CANCER HISTORY:  Oncology History  Carcinoma of upper-outer quadrant of left breast in female, estrogen receptor positive ([facility])  10/17/2019 Initial Diagnosis   Carcinoma of upper-outer quadrant of left breast in female, estrogen receptor positive ([facility])   10/17/2019 Cancer Staging   Staging form: Breast, AJCC 8th Edition - Clinical stage from 10/17/2019: Stage IA (cT1c, cN0, cM0, G2, ER+, PR+, HER2-) - Signed by Eppie Gibson, MD on 10/17/2019     RISK FACTORS:  Menarche was at age 74-14.  First live birth at age 47.  OCP use for  approximately 2 years.  Ovaries intact: yes.  Hysterectomy: yes.  Menopausal status: postmenopausal.  HRT use: <1 year. Colonoscopy: yes; 1 tubular adenoma in 2016. Mammogram within the last year: yes. Number of breast biopsies: 1. Up to date with pelvic exams: n/a. Any excessive radiation exposure in the past: no  Past Medical History:  Diagnosis Date  . acute myeloblastic leukemia (AML) dx'd 06/2014  . Acute myelogenous leukemia ([facility]) 02/2014  . Acute pancreatitis   . Arthritis    "back" (09/17/2017)  . Atrial flutter ([facility])   . Cardiomyopathy ([place])   . Chronic kidney disease, stage II (mild)   . Chronic lower back pain   . Colon polyps 04/29/2010   TUBULAR ADENOMA AND A SERRATED ADENOMA  . COPD (chronic obstructive pulmonary disease) ([facility])   . CORONARY ARTERY DISEASE 12/24/2007  . DIABETES MELLITUS, TYPE II 07/13/2007  . Family history of cervical cancer   . Family history of esophageal cancer   . Family history of leukemia   . Family history of lung cancer   . Family history of skin cancer   . History of blood transfusion 2015   "related to leukemia"  . History of kidney stones 12/24/2007  . History of uterine cancer   . HYPERLIPIDEMIA 12/24/2007  . HYPERTENSION 07/13/2007  . NSTEMI (non-ST elevated myocardial infarction) ([facility]) 09/17/2017  . Unspecified disease of pancreas   . Uterine cancer ([facility])  Past Surgical History:  Procedure Laterality Date  . ABDOMINAL HYSTERECTOMY     "still have my ovaries"  . BREAST LUMPECTOMY WITH RADIOACTIVE SEED LOCALIZATION Left 10/24/2019   Procedure: LEFT BREAST LUMPECTOMY WITH RADIOACTIVE SEED LOCALIZATION;  Surgeon: Jovita Kussmaul, MD;  Location: [facility];  Service: General;  Laterality: Left;  . CARDIAC CATHETERIZATION  2002  . CARDIAC CATHETERIZATION  09/17/2017  . CATARACT EXTRACTION W/ INTRAOCULAR LENS  IMPLANT, BILATERAL Bilateral   . COLONOSCOPY W/ BIOPSIES AND POLYPECTOMY  2011  . CORONARY STENT  INTERVENTION N/A 09/21/2017   Procedure: CORONARY STENT INTERVENTION;  Surgeon: Troy Sine, MD;  Location: [facility];  Service: Cardiovascular;  Laterality: N/A;  . DILATION AND CURETTAGE OF UTERUS    . EXCISIONAL HEMORRHOIDECTOMY  X 2  . GANGLION CYST EXCISION Left X 3  . INTRAVASCULAR PRESSURE WIRE/FFR STUDY N/A 09/17/2017   Procedure: INTRAVASCULAR PRESSURE WIRE/FFR STUDY;  Surgeon: Nelva Bush, MD;  Location: [facility];  Service: Cardiovascular;  Laterality: N/A;  . NASAL SINUS SURGERY    . PORTA CATH INSERTION Right 2013  . RIGHT/LEFT HEART CATH AND CORONARY ANGIOGRAPHY N/A 09/17/2017   Procedure: RIGHT/LEFT HEART CATH AND CORONARY ANGIOGRAPHY;  Surgeon: Nelva Bush, MD;  Location: [facility];  Service: Cardiovascular;  Laterality: N/A;  . TUBAL LIGATION      Social History   Socioeconomic History  . Marital status: Married    Spouse name: Not on file  . Number of children: Not on file  . Years of education: Not on file  . Highest education level: Not on file  Occupational History  . Occupation: works in a lab    Comment: makes glasses  Tobacco Use  . Smoking status: Former Smoker    Packs/day: 1.00    Years: 20.00    Pack years: 20.00    Types: Cigarettes    Quit date: 12/08/2000    Years since quitting: 19.0  . Smokeless tobacco: Never Used  Substance and Sexual Activity  . Alcohol use: No    Alcohol/week: 0.0 standard drinks  . Drug use: No  . Sexual activity: Never  Other Topics Concern  . 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 Difficulty of Paying Living Expenses: Not on file  Food Insecurity:   . Worried About Charity fundraiser in the Last Year: Not on file  . Ran Out of Food in the Last Year: Not on file  Transportation Needs: No Transportation Needs  . Lack of Transportation (Medical): No  . Lack  of Transportation (Non-Medical): No  Physical Activity:   . Days of Exercise per Week: Not on file  . Minutes of Exercise per Session: Not on file  Stress:   . Feeling of Stress : Not on file  Social Connections:   . Frequency of Communication with Friends and Family: Not on file  . Frequency of Social Gatherings with Friends and Family: Not on file  . Attends Religious Services: Not on file  . Active Member of Clubs or Organizations: Not on file  . Attends Archivist Meetings: Not on file  . Marital Status: Not on file     FAMILY HISTORY:  We obtained a detailed, 4-generation family history.  Significant diagnoses are listed below: Family History  Problem Relation Age of Onset  .  COPD Father   . Esophageal cancer Father   . Breast cancer Sister 93  . Lung cancer Sister   . Esophageal cancer Brother   . Suicidality Brother   . Lung cancer Sister   . Lung cancer Sister   . Cervical cancer Sister   . Congestive Heart Failure Mother   . Heart attack Maternal Grandmother   . Skin cancer Maternal Grandfather   . Heart attack Maternal Grandfather   . Stroke Paternal Grandmother 19  . Heart attack Paternal Grandfather   . Cancer Maternal Uncle        unknown type, dx. >65  . Cancer Paternal Aunt        unknown type, dx. >50  . Cancer Paternal Uncle        unknown type, dx. >50  . Cirrhosis Maternal Uncle   . Skin cancer Daughter   . Leukemia Cousin        maternal first cousin; dx. >50, in remission  . Cancer Niece        unknown type behind her ear, dx. 30s/40s  . Colon cancer Neg Hx    [name] has one daughter who is 83 and has had skin cancer, although she is unsure what type of skin cancer. Her daughter has had genetic testing for hereditary cancer risks, although she is not sure what those results were. Additionally, her daughter has had a hysterectomy. [name] has six sisters and four brothers. Four of her sisters have died from cancer. One had breast  cancer diagnosed around the age of 72 and later had lung cancer that she died from at age 48. A second sister died from lung cancer in her 31s. A third sister died from lung cancer in her 56s. A fourth sister died from cervical cancer in her 66s. Two of her brothers are deceased - one died from suicide and the other died from esophageal cancer in his 5s or 38s. Her living siblings have not had cancer and range in age from 68 to 73. One of her nieces has had an unspecified type of cancer that began behind her ear in her 46s or 44s.  [name] mother died at age 42 and did not have cancer. She had two maternal uncles, one of whom died when he was older than 38 from an unknown type of cancer, and one who died at age 14 from cirrhosis of the liver. She also had a maternal aunt who was stillborn. One of [name]'s cousins was diagnosed with leukemia when he was older than 36 and is now in remission. Her maternal grandmother died at age 77 from a heart attack, and her maternal grandfather had a history of skin cancer and died at an unknown age from a heart attack.  [name] father died when he was older than 15 from esophageal cancer. She had nine paternal aunts and uncles (number of aunts and number of uncles unspecified). One of her aunts and one of her uncles had an unknown type of cancer, both diagnosed older than 47. Her paternal grandmother died of a stroke at age 64, and her paternal grandfather died of a heart attack at an older age. There are no other known diagnoses of cancer on the paternal side of the family.  [name] is aware of previous family history of genetic testing for hereditary cancer risks in her daughter, but is unsure of the results. Her ancestry is unknown. There is no reported Ashkenazi [place]  ancestry. There is no known consanguinity.  GENETIC COUNSELING ASSESSMENT: [name] is a 78 y.o. female with a personal history of uterine cancer diagnosed at a young age, which is  somewhat suggestive of a hereditary cancer syndrome and predisposition to cancer. We, therefore, discussed and recommended the following at today's visit.   DISCUSSION: We discussed that approximately 5 - 10% of cancer is hereditary. The most common hereditary cause of uterine cancer diagnosed at young ages (i.e. younger than 88) is Lynch syndrome. Lynch syndrome is a genetic condition that increases the risk for colorectal and uterine cancer, as well as other cancer types. We discussed that testing is beneficial for several reasons including knowing about other cancer risks, identifying potential screening and risk-reduction options that may be appropriate, and to understand if other family members could be at risk for cancer and allow them to undergo genetic testing.   We reviewed the characteristics, features and inheritance patterns of hereditary cancer syndromes. We also discussed genetic testing, including the appropriate family members to test, the process of testing, insurance coverage and turn-around-time for results. We discussed the implications of a negative, positive and/or variant of uncertain significant result. We recommended [name] pursue genetic testing for the [facility] CustomNext-Cancer+RNAinsight gene panel.   The CustomNext-Cancer+RNAinsight panel offered by [facility] includes sequencing and rearrangement analysis for up to 91 genes, which will include the following 47 genes for [name]:  APC*, ATM*, AXIN2, BARD1, BMPR1A, BRCA1*, BRCA2*, BRIP1*, CDH1*, CDK4, CDKN2A, CHEK2*, DICER1, EPCAM, GREM1, HOXB13, MEN1, MLH1*, MSH2*, MSH3, MSH6*, MUTYH*, NBN, NF1*, NF2, NTHL1, PALB2*, PMS2*, POLD1, POLE, PTEN*, RAD51C*, RAD51D*, RECQL, RET, SDHA, SDHAF2, SDHB, SDHC, SDHD, SMAD4, SMARCA4, STK11, TP53*, TSC1, TSC2, and VHL.  DNA and RNA analyses performed for * genes.   Based on [name]'s personal history of uterine cancer diagnosed in her 61s, she meets medical criteria for genetic testing.  Despite that she meets criteria, she may still have an out of pocket cost. We discussed that if her out of pocket cost for testing is over $100, the laboratory will call and confirm whether she wants to proceed with testing.  If the out of pocket cost of testing is less than $100 she will be billed by the genetic testing laboratory.   PLAN: After considering the risks, benefits, and limitations, [name] provided informed consent to pursue genetic testing and her blood sample will be sent to [facility] for analysis of the CustomNext-Cancer+RNAinsight panel. She will have her blood drawn for genetics at her next scheduled blood draw, which is 12/12/19. Once the genetic testing laboratory receives her sample, results should be available within approximately two-three weeks' time, at which point they will be disclosed by telephone to [name], as will any additional recommendations warranted by these results. [name] will receive a summary of her genetic counseling visit and a copy of her results once available. This information will also be available in Epic.   [name] questions were answered to her satisfaction today. Our contact information was provided should additional questions or concerns arise. Thank you for the referral and allowing [place] to share in the care of your patient.   Clint Guy, MS, [facility] Certified Genetic Counselor [email or web address] Phone: [phone]  The patient was seen for a total of 35 minutes in face-to-face genetic counseling.  This patient was discussed with Drs. Magrinat, Lindi Adie and/or Burr Medico who agrees with the above.   _______________________________________________________________________ For Office Staff:  Number of people involved in session: 1  Was an Intern/ student involved with case: no

## 2019-12-08 ENCOUNTER — Other Ambulatory Visit: Payer: Self-pay | Admitting: Genetic Counselor

## 2019-12-08 DIAGNOSIS — Z170 Estrogen receptor positive status [ER+]: Secondary | ICD-10-CM

## 2019-12-08 DIAGNOSIS — C50412 Malignant neoplasm of upper-outer quadrant of left female breast: Secondary | ICD-10-CM

## 2019-12-12 ENCOUNTER — Inpatient Hospital Stay: Payer: Medicare HMO

## 2019-12-12 ENCOUNTER — Inpatient Hospital Stay: Payer: Medicare HMO | Attending: Oncology | Admitting: Oncology

## 2019-12-12 ENCOUNTER — Other Ambulatory Visit: Payer: Self-pay

## 2019-12-12 VITALS — BP 180/71 | HR 62 | Temp 98.4°F | Resp 16 | Ht 62.0 in | Wt 163.5 lb

## 2019-12-12 DIAGNOSIS — C9501 Acute leukemia of unspecified cell type, in remission: Secondary | ICD-10-CM

## 2019-12-12 DIAGNOSIS — Z170 Estrogen receptor positive status [ER+]: Secondary | ICD-10-CM | POA: Diagnosis not present

## 2019-12-12 DIAGNOSIS — C50412 Malignant neoplasm of upper-outer quadrant of left female breast: Secondary | ICD-10-CM

## 2019-12-12 DIAGNOSIS — C9201 Acute myeloblastic leukemia, in remission: Secondary | ICD-10-CM | POA: Diagnosis not present

## 2019-12-12 DIAGNOSIS — E119 Type 2 diabetes mellitus without complications: Secondary | ICD-10-CM | POA: Diagnosis not present

## 2019-12-12 DIAGNOSIS — Z95828 Presence of other vascular implants and grafts: Secondary | ICD-10-CM

## 2019-12-12 DIAGNOSIS — N289 Disorder of kidney and ureter, unspecified: Secondary | ICD-10-CM | POA: Diagnosis not present

## 2019-12-12 DIAGNOSIS — Z8049 Family history of malignant neoplasm of other genital organs: Secondary | ICD-10-CM | POA: Diagnosis not present

## 2019-12-12 DIAGNOSIS — Z8542 Personal history of malignant neoplasm of other parts of uterus: Secondary | ICD-10-CM | POA: Diagnosis not present

## 2019-12-12 DIAGNOSIS — Z79811 Long term (current) use of aromatase inhibitors: Secondary | ICD-10-CM | POA: Diagnosis not present

## 2019-12-12 DIAGNOSIS — I4891 Unspecified atrial fibrillation: Secondary | ICD-10-CM | POA: Diagnosis not present

## 2019-12-12 DIAGNOSIS — Z452 Encounter for adjustment and management of vascular access device: Secondary | ICD-10-CM | POA: Insufficient documentation

## 2019-12-12 LAB — CBC WITH DIFFERENTIAL (CANCER CENTER ONLY)
Abs Immature Granulocytes: 0.01 10*3/uL (ref 0.00–0.07)
Basophils Absolute: 0 10*3/uL (ref 0.0–0.1)
Basophils Relative: 0 %
Eosinophils Absolute: 0.1 10*3/uL (ref 0.0–0.5)
Eosinophils Relative: 1 %
HCT: 34.1 % — ABNORMAL LOW (ref 36.0–46.0)
Hemoglobin: 11.3 g/dL — ABNORMAL LOW (ref 12.0–15.0)
Immature Granulocytes: 0 %
Lymphocytes Relative: 27 %
Lymphs Abs: 1.3 10*3/uL (ref 0.7–4.0)
MCH: 31.4 pg (ref 26.0–34.0)
MCHC: 33.1 g/dL (ref 30.0–36.0)
MCV: 94.7 fL (ref 80.0–100.0)
Monocytes Absolute: 0.4 10*3/uL (ref 0.1–1.0)
Monocytes Relative: 9 %
Neutro Abs: 3 10*3/uL (ref 1.7–7.7)
Neutrophils Relative %: 63 %
Platelet Count: 140 10*3/uL — ABNORMAL LOW (ref 150–400)
RBC: 3.6 MIL/uL — ABNORMAL LOW (ref 3.87–5.11)
RDW: 12.5 % (ref 11.5–15.5)
WBC Count: 4.8 10*3/uL (ref 4.0–10.5)
nRBC: 0 % (ref 0.0–0.2)

## 2019-12-12 LAB — GENETIC SCREENING ORDER

## 2019-12-12 MED ORDER — SODIUM CHLORIDE 0.9% FLUSH
10.0000 mL | INTRAVENOUS | Status: DC | PRN
  Administered 2019-12-12: 10 mL via INTRAVENOUS
  Filled 2019-12-12: qty 10

## 2019-12-12 MED ORDER — HEPARIN SOD (PORK) LOCK FLUSH 100 UNIT/ML IV SOLN
500.0000 [IU] | Freq: Once | INTRAVENOUS | Status: AC | PRN
  Administered 2019-12-12: 15:00:00 500 [IU] via INTRAVENOUS
  Filled 2019-12-12: qty 5

## 2019-12-12 NOTE — Patient Instructions (Signed)

## 2019-12-12 NOTE — Progress Notes (Signed)
[facility] OFFICE PROGRESS NOTE   Diagnosis: AML, breast cancer  INTERVAL HISTORY:   [name] returns as scheduled.  She feels well.  No hot flashes or arthralgias since starting Femara.  The left breast wound has healed.  She reports 2-3 stools per day for the past 6 months.  No nausea or abdominal pain. She reports a good appetite.  She relates weight loss to changing her diet.  She is no longer drinking sodas. Objective:  Vital signs in last 24 hours:  Blood pressure (!) 180/71, pulse 62, temperature 98.4 F (36.9 C), temperature source Temporal, resp. rate 16, height '5\' 2"'$  (1.575 m), weight 163 lb 8 oz (74.2 kg), SpO2 100 %.    Limited physical examination secondary to distancing with the Covid pandemic Breasts: Lumpectomy scar has healed GI: She declined an abdominal exam Vascular: 1+ edema at the right greater than left lower leg and ankle   Lab Results:  Lab Results  Component Value Date   WBC 4.8 12/12/2019   HGB 11.3 (L) 12/12/2019   HCT 34.1 (L) 12/12/2019   MCV 94.7 12/12/2019   PLT 140 (L) 12/12/2019   NEUTROABS 3.0 12/12/2019    CMP  Lab Results  Component Value Date   NA 137 10/20/2019   K 4.0 10/20/2019   CL 103 10/20/2019   CO2 24 10/20/2019   GLUCOSE 355 (H) 10/20/2019   BUN 15 10/20/2019   CREATININE 1.26 (H) 10/20/2019   CALCIUM 7.9 (L) 10/20/2019   PROT 5.8 (L) 03/29/2019   ALBUMIN 2.9 (L) 03/29/2019   AST 24 03/29/2019   ALT 21 03/29/2019   ALKPHOS 190 (H) 03/29/2019   BILITOT 0.6 03/29/2019   GFRNONAA 41 (L) 10/20/2019   GFRAA 48 (L) 10/20/2019     Medications: I have reviewed the patient's current medications.   Assessment/Plan: 1. Acute myelogenous leukemia, monocytic differentiation diagnosed in March of 2015   Induction cytarabine/daunorubicin (7+3) followed by reinduction with cytarabine/daunorubicin and zinecard (5+2) with a recovering bone marrow 05/16/2014 consistent with remission   Status post cycle  1 high-dose araC consolidation 05/30/2014   Status post cycle 2 high-dose araC consolidation 07/11/2014.  Status post cycle 3 high-dose araC consolidation 08/22/2014.  Bone marrow 09/15/2014 showed necrotic marrow with minute focus of myeloid precursors. No evidence of viable acute leukemia. 2.History of pancytopenia secondary to chemotherapy.  3. C. difficile colitis-06/09/2014 treated with Flagyl  4. Diabetes  5. renal insufficiency  6. history of hypokalemia and hypomagnesemia. 7. history of atrial fibrillation  9. history of coronary artery disease  10. Hospitalization with febrile neutropenia/sepsis secondary to Escherichia coli bacteremia 09/02/2014. 11. Shingles involving the right back October 2015. 12. Meningioma on brain CT 05/24/2014 and brain MRI 05/24/2014 13. Hospitalization09/17/2018 through 08/28/2017 with acute CHF. 14.Admission 11/21/2017 with expressive aphasia, seizure activity noted on EEG, placed on Keppra 15.  Left breast cancer, T1NX, status post a left lumpectomy 1116 2020-1.5 cm, grade 2, associated with high-grade DCIS with necrosis, ER 95%, PR 95%, HER-2 negative, KI-6710%  Letrozole 11/02/2019     Disposition: [name] appears unchanged.  She is tolerating the letrozole well.  She will continue letrozole.  She is scheduled for follow-up appointment at [facility] on 02/24/2020.  She will return for an office visit and Port-A-Cath flush on 04/06/2020.  She would like to keep the Port-A-Cath in place.  She reports increased stool frequency for the past 6 months.  I have a low suspicion for C. difficile colitis.  She should follow-up with her primary provider for persistent/increased stool frequency.  I encouraged her to obtain the COVID-19 vaccine as soon as available.  Catherine Coder, MD  12/12/2019  4:20 PM

## 2019-12-29 DIAGNOSIS — Z515 Encounter for palliative care: Secondary | ICD-10-CM | POA: Insufficient documentation

## 2019-12-29 DIAGNOSIS — Z1379 Encounter for other screening for genetic and chromosomal anomalies: Secondary | ICD-10-CM | POA: Insufficient documentation

## 2019-12-30 ENCOUNTER — Telehealth: Payer: Self-pay | Admitting: Genetic Counselor

## 2019-12-30 NOTE — Telephone Encounter (Signed)
LVM that her genetic test results are available and requested that she call back to discuss them.  

## 2020-01-04 ENCOUNTER — Encounter: Payer: Self-pay | Admitting: Genetic Counselor

## 2020-01-04 ENCOUNTER — Ambulatory Visit: Payer: Self-pay | Admitting: Genetic Counselor

## 2020-01-04 ENCOUNTER — Other Ambulatory Visit: Payer: Self-pay

## 2020-01-04 ENCOUNTER — Telehealth (INDEPENDENT_AMBULATORY_CARE_PROVIDER_SITE_OTHER): Payer: Medicare HMO | Admitting: Internal Medicine

## 2020-01-04 DIAGNOSIS — C50412 Malignant neoplasm of upper-outer quadrant of left female breast: Secondary | ICD-10-CM | POA: Diagnosis not present

## 2020-01-04 DIAGNOSIS — E1165 Type 2 diabetes mellitus with hyperglycemia: Secondary | ICD-10-CM | POA: Diagnosis not present

## 2020-01-04 DIAGNOSIS — I10 Essential (primary) hypertension: Secondary | ICD-10-CM

## 2020-01-04 DIAGNOSIS — Z170 Estrogen receptor positive status [ER+]: Secondary | ICD-10-CM | POA: Diagnosis not present

## 2020-01-04 DIAGNOSIS — Z794 Long term (current) use of insulin: Secondary | ICD-10-CM

## 2020-01-04 DIAGNOSIS — Z1379 Encounter for other screening for genetic and chromosomal anomalies: Secondary | ICD-10-CM

## 2020-01-04 NOTE — Progress Notes (Signed)
Virtual Visit via Telephone Note  I connected with [name] on 01/04/20 at  1:00 PM EST by telephone and verified that I am speaking with the correct person using two identifiers.   I discussed the limitations, risks, security and privacy concerns of performing an evaluation and management service by telephone and the availability of in person appointments. I also discussed with the patient that there may be a patient responsible charge related to this service. The patient expressed understanding and agreed to proceed.  Location patient: home Location provider: work office Participants present for the call: patient, provider Patient did not have a visit in the prior 7 days to address this/these issue(s).   History of Present Illness:  This visit is scheduled as a 42-monthfollow-up.  Since I last saw her she was diagnosed with left breast cancer and underwent lumpectomy and is now on letrozole.  She was told she did not need radiation or chemotherapy.  She is followed by Dr. SBenay Spice  She states she has lost 20 pounds, her CBGs have improved although she has not been checking every day, she remains on insulin.  She has not been checking her blood pressure at home but states every time she visits the doctor's office it has been she has no acute complaints today.   Observations/Objective: Patient sounds cheerful and well on the phone. I do not appreciate any increased work of breathing. Speech and thought processing are grossly intact. Patient reported vitals: None reported   Current Outpatient Medications:  .  amLODipine (NORVASC) 5 MG tablet, TAKE 1 TABLET EVERY DAY, Disp: 90 tablet, Rfl: 1 .  aspirin EC 81 MG tablet, Take 1 tablet (81 mg total) by mouth daily., Disp: , Rfl:  .  atorvastatin (LIPITOR) 40 MG tablet, TAKE 1 TABLET EVERY DAY, Disp: 90 tablet, Rfl: 1 .  BD VEO INSULIN SYRINGE U/F 31G X 15/64" 0.3 ML MISC, USE THREE TIMES DAILY, Disp: 100 each, Rfl: 1 .  Blood  Glucose Monitoring Suppl (TRUE METRIX GO GLUCOSE METER) w/Device KIT, 30 Units by Does not apply route daily., Disp: 1 kit, Rfl: 0 .  Calcium Carbonate-Vit D-Min (CALTRATE 600+D PLUS MINERALS) 600-800 MG-UNIT TABS, Take 1 tablet by mouth daily., Disp: , Rfl:  .  clopidogrel (PLAVIX) 75 MG tablet, TAKE 1 TABLET EVERY EVENING, Disp: 90 tablet, Rfl: 1 .  cyanocobalamin (,VITAMIN B-12,) 1000 MCG/ML injection, ADMINISTER 1 ML(1000 MCG) IN THE MUSCLE 1 TIME FOR 1 DOSE, Disp: 10 mL, Rfl: 10 .  fluticasone furoate-vilanterol (BREO ELLIPTA) 200-25 MCG/INH AEPB, Inhale 1 puff into the lungs daily., Disp: 120 each, Rfl: 5 .  furosemide (LASIX) 40 MG tablet, Take 1.5 tablets (60 mg total) by mouth daily., Disp: 180 tablet, Rfl: 3 .  glucose blood (TRUE METRIX BLOOD GLUCOSE TEST) test strip, Use to test glucose 3 times daily. Dx e11.9  Z79.4 - [employer], Disp: 300 each, Rfl: 12 .  HYDROcodone-acetaminophen (NORCO) 5-325 MG tablet, Take 1 tablet by mouth every 6 (six) hours as needed for moderate pain., Disp: 120 tablet, Rfl: 0 .  Insulin Glargine, 2 Unit Dial, (TOUJEO MAX SOLOSTAR) 300 UNIT/ML SOPN, Inject 18 Units into the skin at bedtime. (Patient taking differently: Inject 12 Units into the skin at bedtime. ), Disp: 18 mL, Rfl: 3 .  Insulin Pen Needle (PEN NEEDLES 3/16") 31G X 5 MM MISC, Use with Novolin R, Disp: 100 each, Rfl: 0 .  insulin regular (NOVOLIN R) 100 units/mL injection, Inject 0.12 mLs (  12 Units total) into the skin 3 (three) times daily before meals. INJECT 10 UNITS SUBCUTANEOUSLY 3 TIMES DAILY. INJECT 4 EXTRA UNITS IF CBG OVER 200. INJECT 6 EXTRA UNITS IF CBG OVER 300, Disp: 40 mL, Rfl: 3 .  isosorbide mononitrate (IMDUR) 30 MG 24 hr tablet, TAKE 1 AND 1/2 TABLETS EVERY DAY, Disp: 135 tablet, Rfl: 1 .  letrozole (FEMARA) 2.5 MG tablet, Take 1 tablet (2.5 mg total) by mouth daily., Disp: 90 tablet, Rfl: 3 .  LORazepam (ATIVAN) 0.5 MG tablet, TAKE 1 TABLET BY MOUTH EVERY 8 HOURS AS NEEDED FOR  ANXIETY(DO NOT EXCEED MORE THAN 2 TABS IN 24 HRS), Disp: 60 tablet, Rfl: 2 .  MAGNESIUM OXIDE 400 PO, Take 400 mg by mouth 2 (two) times daily., Disp: , Rfl:  .  metoprolol succinate (TOPROL-XL) 100 MG 24 hr tablet, TAKE 1 TABLET EVERY DAY  WITH  OR  IMMEDIATELY FOLLOWING A MEAL, Disp: 90 tablet, Rfl: 1 .  nitroGLYCERIN (NITROSTAT) 0.4 MG SL tablet, Place 1 tablet (0.4 mg total) under the tongue every 5 (five) minutes as needed for chest pain. (Patient not taking: Reported on 11/02/2019), Disp: 24 tablet, Rfl: 1 .  polyethylene glycol (MIRALAX / GLYCOLAX) 17 g packet, Take 17 g by mouth as needed., Disp: , Rfl:  .  potassium chloride (KLOR-CON) 10 MEQ tablet, Take 1 tablet (10 mEq total) by mouth daily., Disp: 90 tablet, Rfl: 3 .  promethazine (PHENERGAN) 12.5 MG tablet, Take 2 tablets (25 mg total) by mouth every 6 (six) hours as needed for nausea. (Patient not taking: Reported on 12/12/2019), Disp: 12 tablet, Rfl: 0 .  SYRINGE/NEEDLE, DISP, 1 ML (BD ECLIPSE SYRINGE) 25G X 5/8" 1 ML MISC, Use for b12 injections, Disp: 100 each, Rfl: 3 No current facility-administered medications for this visit.  Facility-Administered Medications Ordered in Other Visits:  .  sodium chloride 0.9 % 1,000 mL with magnesium sulfate 2 g infusion, , Intravenous, Continuous, Ladell Pier, MD, Stopped at 08/04/14 1441  Review of Systems:  Constitutional: Denies fever, chills, diaphoresis, appetite change and fatigue.  HEENT: Denies photophobia, eye pain, redness, hearing loss, ear pain, congestion, sore throat, rhinorrhea, sneezing, mouth sores, trouble swallowing, neck pain, neck stiffness and tinnitus.   Respiratory: Denies SOB, DOE, cough, chest tightness,  and wheezing.   Cardiovascular: Denies chest pain, palpitations and leg swelling.  Gastrointestinal: Denies nausea, vomiting, abdominal pain, diarrhea, constipation, blood in stool and abdominal distention.  Genitourinary: Denies dysuria, urgency, frequency,  hematuria, flank pain and difficulty urinating.  Endocrine: Denies: hot or cold intolerance, sweats, changes in hair or nails, polyuria, polydipsia. Musculoskeletal: Denies myalgias, back pain, joint swelling, arthralgias and gait problem.  Skin: Denies pallor, rash and wound.  Neurological: Denies dizziness, seizures, syncope, weakness, light-headedness, numbness and headaches.  Hematological: Denies adenopathy. Easy bruising, personal or family bleeding history  Psychiatric/Behavioral: Denies suicidal ideation, mood changes, confusion, nervousness, sleep disturbance and agitation   Assessment and Plan:  Type 2 diabetes mellitus with hyperglycemia, with long-term current use of insulin ([place]) -Suspect her A1c will have improved with weight loss, she has cut out sodas completely. -Last A1c was 7.7 in November 2020.  Morbid (severe) obesity due to excess calories ([place]) -Discussed healthy lifestyle, including increased physical activity and better food choices to promote weight loss. -She has lost 29 pounds since we last spoke.  Carcinoma of upper-outer quadrant of left breast in female, estrogen receptor positive ([place]) -Followed by Dr. Learta Codding, currently on letrozole.  Essential hypertension -Per  patient well-controlled.    I discussed the assessment and treatment plan with the patient. The patient was provided an opportunity to ask questions and all were answered. The patient agreed with the plan and demonstrated an understanding of the instructions.   The patient was advised to call back or seek an in-person evaluation if the symptoms worsen or if the condition fails to improve as anticipated.  I provided 16 minutes of non-face-to-face time during this encounter.   Lelon Frohlich, MD [facility] at [facility]

## 2020-01-04 NOTE — Progress Notes (Signed)
HPI:  Ms. Malbrough was previously seen in the [facility] clinic due to a personal history of breast cancer, uterine cancer, and leukemia, as well as a family history of breast cancer, and concerns regarding a hereditary predisposition to cancer. Please refer to our prior cancer genetics clinic note for more information regarding our discussion, assessment and recommendations, at the time. Ms. Depaola recent genetic test results were disclosed to her, as were recommendations warranted by these results. These results and recommendations are discussed in more detail below.  CANCER HISTORY:  Oncology History  Carcinoma of upper-outer quadrant of left breast in female, estrogen receptor positive ([place])  10/17/2019 Initial Diagnosis   Carcinoma of upper-outer quadrant of left breast in female, estrogen receptor positive ([place])   10/17/2019 Cancer Staging   Staging form: Breast, AJCC 8th Edition - Clinical stage from 10/17/2019: Stage IA (cT1c, cN0, cM0, G2, ER+, PR+, HER2-) - Signed by Eppie Gibson, MD on 10/17/2019   12/29/2019 Genetic Testing   Negative genetic testing:  No pathogenic variants detected on the [facility] CustomNext-Cancer+RNAinsight panel. Two variants of uncertain significance were detected - one in the BRIP1 gene called c.1595T>C (p.M532T), and one in the PALB2 gene called c.2881C>A (p.L961M). The report date is 12/29/2019.  The CustomNext-Cancer+RNAinsight panel offered by [facility] includes sequencing and rearrangement analysis for up to 91 genes, which included the following 47 genes for Ms. Schul:  APC*, ATM*, AXIN2, BARD1, BMPR1A, BRCA1*, BRCA2*, BRIP1*, CDH1*, CDK4, CDKN2A, CHEK2*, DICER1, EPCAM, GREM1, HOXB13, MEN1, MLH1*, MSH2*, MSH3, MSH6*, MUTYH*, NBN, NF1*, NF2, NTHL1, PALB2*, PMS2*, POLD1, POLE, PTEN*, RAD51C*, RAD51D*, RECQL, RET, SDHA, SDHAF2, SDHB, SDHC, SDHD, SMAD4, SMARCA4, STK11, TP53*, TSC1, TSC2, and VHL.  DNA and RNA analyses performed for * genes.       FAMILY HISTORY:  We obtained a detailed, 4-generation family history.  Significant diagnoses are listed below: Family History  Problem Relation Age of Onset  . COPD Father   . Esophageal cancer Father   . Breast cancer Sister 72  . Lung cancer Sister   . Esophageal cancer Brother   . Suicidality Brother   . Lung cancer Sister   . Lung cancer Sister   . Cervical cancer Sister   . Congestive Heart Failure Mother   . Heart attack Maternal Grandmother   . Skin cancer Maternal Grandfather   . Heart attack Maternal Grandfather   . Stroke Paternal Grandmother 63  . Heart attack Paternal Grandfather   . Cancer Maternal Uncle        unknown type, dx. >65  . Cancer Paternal Aunt        unknown type, dx. >50  . Cancer Paternal Uncle        unknown type, dx. >50  . Cirrhosis Maternal Uncle   . Skin cancer Daughter   . Leukemia Cousin        maternal first cousin; dx. >50, in remission  . Cancer Niece        unknown type behind her ear, dx. 30s/40s  . Colon cancer Neg Hx     Ms. Leopard has one daughter who is 19 and has had skin cancer, although she is unsure what type of skin cancer. Her daughter has had genetic testing for hereditary cancer risks, although she is not sure what those results were. Additionally, her daughter has had a hysterectomy. Ms. Lamore has six sisters and four brothers. Four of her sisters have died from cancer. One had breast cancer diagnosed around the  age of 26 and later had lung cancer that she died from at age 67. A second sister died from lung cancer in her 46s. A third sister died from lung cancer in her 35s. A fourth sister died from cervical cancer in her 51s. Two of her brothers are deceased - one died from suicide and the other died from esophageal cancer in his 32s or 61s. Her living siblings have not had cancer and range in age from 60 to 48. One of her nieces has had an unspecified type of cancer that began behind her ear in her 41s or 41s.  Ms.  Deutschman mother died at age 59 and did not have cancer. She had two maternal uncles, one of whom died when he was older than 34 from an unknown type of cancer, and one who died at age 26 from cirrhosis of the liver. She also had a maternal aunt who was stillborn. One of Ms. Karrer's cousins was diagnosed with leukemia when he was older than 50 and is now in remission. Her maternal grandmother died at age 38 from a heart attack, and her maternal grandfather had a history of skin cancer and died at an unknown age from a heart attack.  Ms. Luthi father died when he was older than 73 from esophageal cancer. She had nine paternal aunts and uncles (number of aunts and number of uncles unspecified). One of her aunts and one of her uncles had an unknown type of cancer, both diagnosed older than 33. Her paternal grandmother died of a stroke at age 8, and her paternal grandfather died of a heart attack at an older age. There are no other known diagnoses of cancer on the paternal side of the family.  Ms. Vivian is aware of previous family history of genetic testing for hereditary cancer risks in her daughter, but is unsure of the results. Her ancestry is unknown. There is no reported Ashkenazi Jewish ancestry. There is no known consanguinity.  GENETIC TEST RESULTS: Genetic testing reported out on 12/29/2019 through the [facility] CustomNext-Cancer+RNAinsight panel. No pathogenic variants were detected.  The CustomNext-Cancer+RNAinsight panel offered by [facility] includes sequencing and rearrangement analysis for up to 91 genes, which will include the following 47 genes for Ms. Boeh:  APC*, ATM*, AXIN2, BARD1, BMPR1A, BRCA1*, BRCA2*, BRIP1*, CDH1*, CDK4, CDKN2A, CHEK2*, DICER1, EPCAM, GREM1, HOXB13, MEN1, MLH1*, MSH2*, MSH3, MSH6*, MUTYH*, NBN, NF1*, NF2, NTHL1, PALB2*, PMS2*, POLD1, POLE, PTEN*, RAD51C*, RAD51D*, RECQL, RET, SDHA, SDHAF2, SDHB, SDHC, SDHD, SMAD4, SMARCA4, STK11, TP53*, TSC1, TSC2, and VHL.  DNA and  RNA analyses performed for * genes. The test report will be scanned into EPIC and located under the Molecular Pathology section of the Results Review tab.  A portion of the result report is included below for reference.     We discussed with Ms. Takacs that because current genetic testing is not perfect, it is possible there may be a gene mutation in one of these genes that current testing cannot detect, but that chance is small.  We also discussed, that there could be another gene that has not yet been discovered, or that we have not yet tested, that is responsible for the cancer diagnoses in the family. It is also possible there is a hereditary cause for the cancer in the family that Ms. Bynum did not inherit and therefore was not identified in her testing.  Therefore, it is important to remain in touch with cancer genetics in the future so that we can  continue to offer Ms. Wyke the most up to date genetic testing.   Genetic testing did identify two variants of uncertain significance (VUS) - one in the BRIP1 gene called c.1595T>C (p.M532T), and a second in the PALB2 gene called c.2881C>A (p.L961M).  At this time, it is unknown if these variants are associated with increased cancer risk or if they are normal findings, but most variants such as these get reclassified to being inconsequential. They should not be used to make medical management decisions. With time, we suspect the lab will determine the significance of these variants, if any. If we do learn more about them, we will try to contact Ms. Honda to discuss it further. However, it is important to stay in touch with [place] periodically and keep the address and phone number up to date.  CANCER SCREENING RECOMMENDATIONS: Ms. Christoffersen test result is considered negative (normal).  This means that we have not identified a hereditary cause for her personal and family history of cancer at this time. While reassuring, this does not definitively rule out a  hereditary predisposition to cancer. It is still possible that there could be genetic mutations that are undetectable by current technology. There could be genetic mutations in genes that have not been tested or identified to increase cancer risk.  Therefore, it is recommended she continue to follow the cancer management and screening guidelines provided by her oncology and primary healthcare providers.   An individual's cancer risk and medical management are not determined by genetic test results alone. Overall cancer risk assessment incorporates additional factors, including personal medical history, family history, and any available genetic information that may result in a personalized plan for cancer prevention and surveillance.  RECOMMENDATIONS FOR FAMILY MEMBERS:  Individuals in this family might be at some increased risk of developing cancer, over the general population risk, simply due to the family history of cancer.  We recommended women in this family have a yearly mammogram beginning at age 88, or 71 years younger than the earliest onset of cancer, an annual clinical breast exam, and perform monthly breast self-exams. Women in this family should also have a gynecological exam as recommended by their primary provider. All family members should have a colonoscopy by age 42.  FOLLOW-UP: Lastly, we discussed with Ms. Lui that cancer genetics is a rapidly advancing field and it is possible that new genetic tests will be appropriate for her and/or her family members in the future. We encouraged her to remain in contact with cancer genetics on an annual basis so we can update her personal and family histories and let her know of advances in cancer genetics that may benefit this family.   Our contact number was provided. Ms. Pamintuan questions were answered to her satisfaction, and she knows she is welcome to call us at anytime with additional questions or concerns.   Clint Guy, MS,  [facility] Licensed Certified Genetic Counselor [email or web address] Phone: [phone]

## 2020-01-04 NOTE — Telephone Encounter (Signed)
Revealed negative genetic testing.  Discussed that we do not know why she has had multiple types of cancer or why there is cancer in the family.  It could be due to a different gene that we are not testing, or our current technology may not be able detect something.  It will be important for her to keep in contact with genetics to keep up with whether additional testing may be appropriate in the future.   Genetic testing did detect two variants of uncertain significance - one in the BRIP1 gene called c.1595T>C (p.M532T) and one in the PALB2 gene called c.2881C>A (p.L961M). Her results are still considered normal and these variants should not impact her medical management.

## 2020-01-05 ENCOUNTER — Encounter: Payer: Self-pay | Admitting: Genetic Counselor

## 2020-01-09 ENCOUNTER — Telehealth: Payer: Self-pay | Admitting: Internal Medicine

## 2020-01-09 DIAGNOSIS — I10 Essential (primary) hypertension: Secondary | ICD-10-CM

## 2020-01-09 DIAGNOSIS — E785 Hyperlipidemia, unspecified: Secondary | ICD-10-CM

## 2020-01-09 DIAGNOSIS — E1165 Type 2 diabetes mellitus with hyperglycemia: Secondary | ICD-10-CM

## 2020-01-09 DIAGNOSIS — I2510 Atherosclerotic heart disease of native coronary artery without angina pectoris: Secondary | ICD-10-CM

## 2020-01-09 NOTE — Telephone Encounter (Signed)
Pt want Refills call in to [facility] on all medications

## 2020-01-10 MED ORDER — ATORVASTATIN CALCIUM 40 MG PO TABS: 40.0000 mg | ORAL_TABLET | Freq: Every day | ORAL | 1 refills | Status: DC

## 2020-01-10 MED ORDER — METOPROLOL SUCCINATE ER 100 MG PO TB24: ORAL_TABLET | ORAL | 1 refills | Status: DC

## 2020-01-10 MED ORDER — NITROGLYCERIN 0.4 MG SL SUBL: 0.4000 mg | SUBLINGUAL_TABLET | SUBLINGUAL | 1 refills | Status: DC | PRN

## 2020-01-10 MED ORDER — CLOPIDOGREL BISULFATE 75 MG PO TABS: 75.0000 mg | ORAL_TABLET | Freq: Every evening | ORAL | 1 refills | Status: DC

## 2020-01-10 MED ORDER — INSULIN REGULAR HUMAN 100 UNIT/ML IJ SOLN: 12.0000 [IU] | Freq: Three times a day (TID) | INTRAMUSCULAR | 3 refills | Status: DC

## 2020-01-10 MED ORDER — CYANOCOBALAMIN 1000 MCG/ML IJ SOLN: INTRAMUSCULAR | 10 refills | Status: DC

## 2020-01-10 MED ORDER — "BD SAFETYGLIDE SYRINGE/NEEDLE 25G X 1"" 3 ML MISC": 11 refills | Status: DC

## 2020-01-10 MED ORDER — TOUJEO MAX SOLOSTAR 300 UNIT/ML ~~LOC~~ SOPN: 18.0000 [IU] | PEN_INJECTOR | Freq: Every day | SUBCUTANEOUS | 3 refills | Status: DC

## 2020-01-10 MED ORDER — FUROSEMIDE 40 MG PO TABS: 60.0000 mg | ORAL_TABLET | Freq: Every day | ORAL | 3 refills | Status: DC

## 2020-01-10 MED ORDER — ISOSORBIDE MONONITRATE ER 30 MG PO TB24: ORAL_TABLET | ORAL | 1 refills | Status: DC

## 2020-01-10 MED ORDER — AMLODIPINE BESYLATE 5 MG PO TABS: 5.0000 mg | ORAL_TABLET | Freq: Every day | ORAL | 1 refills | Status: DC

## 2020-01-10 MED ORDER — POTASSIUM CHLORIDE CRYS ER 10 MEQ PO TBCR: 10.0000 meq | EXTENDED_RELEASE_TABLET | Freq: Every day | ORAL | 1 refills | Status: DC

## 2020-01-10 NOTE — Telephone Encounter (Signed)
Refills sent and patient is aware 

## 2020-01-17 ENCOUNTER — Telehealth: Payer: Self-pay | Admitting: Internal Medicine

## 2020-01-17 NOTE — Telephone Encounter (Signed)
Pt is requesting all her [employer] prescriptions sent to [facility]. Per pt, she needs this done today or tomorrow and would appreciate a call once this is done. Thank you.   Pt's # [phone]

## 2020-01-18 NOTE — Telephone Encounter (Signed)
Spoke with patient and she will check with [facility] again, and call back as needed.

## 2020-01-25 ENCOUNTER — Telehealth: Payer: Self-pay | Admitting: Internal Medicine

## 2020-01-25 NOTE — Telephone Encounter (Signed)
Pt is requesting a phone call to [facility] about her B12 injection. She needs it by 21st and they have not mailed it out yet. They are requesting information from her provider.   Phone: [phone]

## 2020-01-25 NOTE — Telephone Encounter (Signed)
Spoke to someone at [facility] and they will fax a new Rx for B12 and Novolog.

## 2020-01-27 ENCOUNTER — Telehealth: Payer: Self-pay | Admitting: Internal Medicine

## 2020-01-27 NOTE — Telephone Encounter (Signed)
Pt is requesting a refill on Vitamin B-12 1000 MCG/ML injection sent to [facility]. Thanks

## 2020-01-27 NOTE — Telephone Encounter (Signed)
Rx faxed and confirmed and patient is aware.

## 2020-01-31 ENCOUNTER — Ambulatory Visit: Payer: Medicare HMO | Admitting: Internal Medicine

## 2020-01-31 NOTE — Telephone Encounter (Signed)
Rx already sent.

## 2020-02-10 ENCOUNTER — Ambulatory Visit: Payer: Medicare HMO | Admitting: Internal Medicine

## 2020-02-14 ENCOUNTER — Other Ambulatory Visit: Payer: Medicare HMO

## 2020-02-15 ENCOUNTER — Telehealth: Payer: Self-pay | Admitting: Internal Medicine

## 2020-02-15 NOTE — Telephone Encounter (Signed)
  HYDROcodone-acetaminophen (NORCO) 5-325 MG tablet   [phone] - [address], [place] AT [address] [place] Phone:  [phone]  Fax:  [phone]

## 2020-02-16 NOTE — Telephone Encounter (Signed)
Could you please deny this refill.  The patient needs an office visit.  thanks

## 2020-02-23 ENCOUNTER — Telehealth: Payer: Self-pay | Admitting: Internal Medicine

## 2020-02-23 NOTE — Telephone Encounter (Signed)
Pt is requesting a refill on Hydrocodone-Acetaminophen 5-325 mg tablet. Pt uses [facility] representative at [employer] Thanks

## 2020-02-23 NOTE — Telephone Encounter (Signed)
Patient had an appointment 01/04/20.  Would you like the patient to schedule an office visit?

## 2020-02-24 ENCOUNTER — Telehealth (INDEPENDENT_AMBULATORY_CARE_PROVIDER_SITE_OTHER): Payer: Medicare HMO | Admitting: Internal Medicine

## 2020-02-24 ENCOUNTER — Other Ambulatory Visit: Payer: Self-pay

## 2020-02-24 DIAGNOSIS — I10 Essential (primary) hypertension: Secondary | ICD-10-CM | POA: Diagnosis not present

## 2020-02-24 DIAGNOSIS — C9201 Acute myeloblastic leukemia, in remission: Secondary | ICD-10-CM | POA: Diagnosis not present

## 2020-02-24 DIAGNOSIS — Z794 Long term (current) use of insulin: Secondary | ICD-10-CM

## 2020-02-24 DIAGNOSIS — E538 Deficiency of other specified B group vitamins: Secondary | ICD-10-CM | POA: Diagnosis not present

## 2020-02-24 DIAGNOSIS — C50912 Malignant neoplasm of unspecified site of left female breast: Secondary | ICD-10-CM | POA: Diagnosis not present

## 2020-02-24 DIAGNOSIS — E1165 Type 2 diabetes mellitus with hyperglycemia: Secondary | ICD-10-CM | POA: Diagnosis not present

## 2020-02-24 DIAGNOSIS — D696 Thrombocytopenia, unspecified: Secondary | ICD-10-CM | POA: Diagnosis not present

## 2020-02-24 DIAGNOSIS — E785 Hyperlipidemia, unspecified: Secondary | ICD-10-CM | POA: Diagnosis not present

## 2020-02-24 DIAGNOSIS — E119 Type 2 diabetes mellitus without complications: Secondary | ICD-10-CM | POA: Diagnosis not present

## 2020-02-24 DIAGNOSIS — E876 Hypokalemia: Secondary | ICD-10-CM | POA: Diagnosis not present

## 2020-02-24 DIAGNOSIS — M542 Cervicalgia: Secondary | ICD-10-CM | POA: Diagnosis not present

## 2020-02-24 DIAGNOSIS — K5900 Constipation, unspecified: Secondary | ICD-10-CM | POA: Diagnosis not present

## 2020-02-24 MED ORDER — "BD VEO INSULIN SYRINGE U/F 31G X 15/64"" 0.3 ML MISC": 1 refills | Status: DC

## 2020-02-24 MED ORDER — HYDROCODONE-ACETAMINOPHEN 5-325 MG PO TABS: 1.0000 | ORAL_TABLET | Freq: Four times a day (QID) | ORAL | 0 refills | Status: DC | PRN

## 2020-02-24 NOTE — Telephone Encounter (Signed)
Ok to give enough for 1 month. By then will be due for OV for further refills and chronic disease management.

## 2020-02-24 NOTE — Progress Notes (Signed)
  Virtual Visit via Telephone Note  I connected with [name] on 02/24/20 at  1:00 PM EDT by telephone and verified that I am speaking with the correct person using two identifiers.   I discussed the limitations, risks, security and privacy concerns of performing an evaluation and management service by telephone and the availability of in person appointments. I also discussed with the patient that there may be a patient responsible charge related to this service. The patient expressed understanding and agreed to proceed.  Location patient: home Location provider: work office Participants present for the call: patient, provider Patient did not have a visit in the prior 7 days to address this/these issue(s).   History of Present Illness:  This is a scheduled visit for medication refills per opioid contract.  She takes Norco 5/325 mg every 6 hours and has done so for years for pain related to spinal stenosis.  She has not had increased in pain requirements.  She is also requesting refills for her pen needles for insulin.   Observations/Objective: Patient sounds cheerful and well on the phone. I do not appreciate any increased work of breathing. Speech and thought processing are grossly intact. Patient reported vitals: None reported   Current Outpatient Medications:  .  amLODipine (NORVASC) 5 MG tablet, Take 1 tablet (5 mg total) by mouth daily., Disp: 90 tablet, Rfl: 1 .  aspirin EC 81 MG tablet, Take 1 tablet (81 mg total) by mouth daily., Disp: , Rfl:  .  atorvastatin (LIPITOR) 40 MG tablet, Take 1 tablet (40 mg total) by mouth daily., Disp: 90 tablet, Rfl: 1 .  Blood Glucose Monitoring Suppl (TRUE METRIX GO GLUCOSE METER) w/Device KIT, 30 Units by Does not apply route daily., Disp: 1 kit, Rfl: 0 .  Calcium Carbonate-Vit D-Min (CALTRATE 600+D PLUS MINERALS) 600-800 MG-UNIT TABS, Take 1 tablet by mouth daily., Disp: , Rfl:  .  clopidogrel (PLAVIX) 75 MG tablet, Take 1 tablet (75  mg total) by mouth every evening., Disp: 90 tablet, Rfl: 1 .  cyanocobalamin (,VITAMIN B-12,) 1000 MCG/ML injection, ADMINISTER 1 ML(1000 MCG) IN THE MUSCLE 1 TIME FOR 1 DOSE, Disp: 10 mL, Rfl: 10 .  fluticasone furoate-vilanterol (BREO ELLIPTA) 200-25 MCG/INH AEPB, Inhale 1 puff into the lungs daily., Disp: 120 each, Rfl: 5 .  furosemide (LASIX) 40 MG tablet, Take 1.5 tablets (60 mg total) by mouth daily., Disp: 180 tablet, Rfl: 3 .  glucose blood (TRUE METRIX BLOOD GLUCOSE TEST) test strip, Use to test glucose 3 times daily. Dx e11.9  Z79.4 - [employer], Disp: 300 each, Rfl: 12 .  HYDROcodone-acetaminophen (NORCO) 5-325 MG tablet, Take 1 tablet by mouth every 6 (six) hours as needed for moderate pain., Disp: 120 tablet, Rfl: 0 .  Insulin Glargine, 2 Unit Dial, (TOUJEO MAX SOLOSTAR) 300 UNIT/ML SOPN, Inject 18 Units into the skin at bedtime., Disp: 18 mL, Rfl: 3 .  Insulin Pen Needle (PEN NEEDLES 3/16") 31G X 5 MM MISC, Use with Novolin R, Disp: 100 each, Rfl: 0 .  insulin regular (NOVOLIN R) 100 units/mL injection, Inject 0.12 mLs (12 Units total) into the skin 3 (three) times daily before meals. INJECT 10 UNITS SUBCUTANEOUSLY 3 TIMES DAILY. INJECT 4 EXTRA UNITS IF CBG OVER 200. INJECT 6 EXTRA UNITS IF CBG OVER 300, Disp: 40 mL, Rfl: 3 .  Insulin Syringe-Needle U-100 (BD VEO INSULIN SYRINGE U/F) 31G X 15/64" 0.3 ML MISC, USE THREE TIMES DAILY, Disp: 100 each, Rfl: 1 .  isosorbide   mononitrate (IMDUR) 30 MG 24 hr tablet, TAKE 1 AND 1/2 TABLETS EVERY DAY, Disp: 135 tablet, Rfl: 1 .  letrozole (FEMARA) 2.5 MG tablet, Take 1 tablet (2.5 mg total) by mouth daily., Disp: 90 tablet, Rfl: 3 .  LORazepam (ATIVAN) 0.5 MG tablet, TAKE 1 TABLET BY MOUTH EVERY 8 HOURS AS NEEDED FOR ANXIETY(DO NOT EXCEED MORE THAN 2 TABS IN 24 HRS), Disp: 60 tablet, Rfl: 2 .  MAGNESIUM OXIDE 400 PO, Take 400 mg by mouth 2 (two) times daily., Disp: , Rfl:  .  metoprolol succinate (TOPROL-XL) 100 MG 24 hr tablet, TAKE 1 TABLET EVERY DAY   WITH  OR  IMMEDIATELY FOLLOWING A MEAL, Disp: 90 tablet, Rfl: 1 .  nitroGLYCERIN (NITROSTAT) 0.4 MG SL tablet, Place 1 tablet (0.4 mg total) under the tongue every 5 (five) minutes as needed for chest pain., Disp: 24 tablet, Rfl: 1 .  polyethylene glycol (MIRALAX / GLYCOLAX) 17 g packet, Take 17 g by mouth as needed., Disp: , Rfl:  .  potassium chloride (KLOR-CON) 10 MEQ tablet, Take 1 tablet (10 mEq total) by mouth daily., Disp: 90 tablet, Rfl: 1 .  promethazine (PHENERGAN) 12.5 MG tablet, Take 2 tablets (25 mg total) by mouth every 6 (six) hours as needed for nausea., Disp: 12 tablet, Rfl: 0 .  SYRINGE-NEEDLE, DISP, 3 ML (BD SAFETYGLIDE SYRINGE/NEEDLE) 25G X 1" 3 ML MISC, Use for B12 injections, Disp: 100 each, Rfl: 11 .  HYDROcodone-acetaminophen (NORCO) 5-325 MG tablet, Take 1 tablet by mouth every 6 (six) hours as needed for moderate pain., Disp: 120 tablet, Rfl: 0 .  HYDROcodone-acetaminophen (NORCO) 5-325 MG tablet, Take 1 tablet by mouth every 6 (six) hours as needed for moderate pain., Disp: 120 tablet, Rfl: 0 No current facility-administered medications for this visit.  Facility-Administered Medications Ordered in Other Visits:  .  sodium chloride 0.9 % 1,000 mL with magnesium sulfate 2 g infusion, , Intravenous, Continuous, Sherrill, Gary B, MD, Stopped at 08/04/14 1441  Review of Systems:  Constitutional: Denies fever, chills, diaphoresis, appetite change and fatigue.  HEENT: Denies photophobia, eye pain, redness, hearing loss, ear pain, congestion, sore throat, rhinorrhea, sneezing, mouth sores, trouble swallowing, neck pain, neck stiffness and tinnitus.   Respiratory: Denies SOB, DOE, cough, chest tightness,  and wheezing.   Cardiovascular: Denies chest pain, palpitations and leg swelling.  Gastrointestinal: Denies nausea, vomiting, abdominal pain, diarrhea, constipation, blood in stool and abdominal distention.  Genitourinary: Denies dysuria, urgency, frequency, hematuria, flank  pain and difficulty urinating.  Endocrine: Denies: hot or cold intolerance, sweats, changes in hair or nails, polyuria, polydipsia. Musculoskeletal: Denies myalgias, back pain, joint swelling, arthralgias and gait problem.  Skin: Denies pallor, rash and wound.  Neurological: Denies dizziness, seizures, syncope, weakness, light-headedness, numbness and headaches.  Hematological: Denies adenopathy. Easy bruising, personal or family bleeding history  Psychiatric/Behavioral: Denies suicidal ideation, mood changes, confusion, nervousness, sleep disturbance and agitation   Assessment and Plan:  Type 2 diabetes mellitus with hyperglycemia, with long-term current use of insulin (HCC)  - Plan: Insulin Syringe-Needle U-100 (BD VEO INSULIN SYRINGE U/F) 31G X 15/64" 0.3 ML MISC -She is due for recheck of her A1c, and was last 7.7 in November 2020. -In person visit in 3 months.  Cervical spine pain  -PDMP has been reviewed in epic, no red flags, overdose risk score is 170. -Refill hydrocodone 5/325 mg number 120 tablets, x3 months.    I discussed the assessment and treatment plan with the patient. The patient was provided   an opportunity to ask questions and all were answered. The patient agreed with the plan and demonstrated an understanding of the instructions.   The patient was advised to call back or seek an in-person evaluation if the symptoms worsen or if the condition fails to improve as anticipated.  I provided 12 minutes of non-face-to-face time during this encounter.    Hernandez Acosta, MD [facility]  

## 2020-03-02 ENCOUNTER — Other Ambulatory Visit: Payer: Self-pay | Admitting: *Deleted

## 2020-03-02 MED ORDER — "INSULIN SYRINGE-NEEDLE U-100 31G X 5/16"" 0.3 ML MISC": 2 refills | Status: DC

## 2020-03-05 ENCOUNTER — Telehealth: Payer: Self-pay | Admitting: Internal Medicine

## 2020-03-05 NOTE — Progress Notes (Signed)
[name] husband stated she was at work. I scheduled a call back after 3:30pm.     [name] [employer] Scheduler

## 2020-04-02 ENCOUNTER — Telehealth: Payer: Self-pay | Admitting: Internal Medicine

## 2020-04-02 DIAGNOSIS — E785 Hyperlipidemia, unspecified: Secondary | ICD-10-CM

## 2020-04-02 DIAGNOSIS — I10 Essential (primary) hypertension: Secondary | ICD-10-CM

## 2020-04-02 NOTE — Telephone Encounter (Signed)
Medication:B12 Injection, Amlodipine, Atorvastain, True Metrix Test Strips   Pharmacy: [facility]

## 2020-04-03 ENCOUNTER — Other Ambulatory Visit: Payer: Self-pay | Admitting: Internal Medicine

## 2020-04-03 MED ORDER — CYANOCOBALAMIN 1000 MCG/ML IJ SOLN: INTRAMUSCULAR | 10 refills | Status: DC

## 2020-04-03 MED ORDER — AMLODIPINE BESYLATE 5 MG PO TABS: 5.0000 mg | ORAL_TABLET | Freq: Every day | ORAL | 1 refills | Status: DC

## 2020-04-03 MED ORDER — TRUE METRIX BLOOD GLUCOSE TEST VI STRP: ORAL_STRIP | 12 refills | Status: DC

## 2020-04-03 MED ORDER — ATORVASTATIN CALCIUM 40 MG PO TABS: 40.0000 mg | ORAL_TABLET | Freq: Every day | ORAL | 1 refills | Status: DC

## 2020-04-03 NOTE — Telephone Encounter (Signed)
Refills sent

## 2020-04-06 ENCOUNTER — Other Ambulatory Visit: Payer: Self-pay

## 2020-04-06 ENCOUNTER — Inpatient Hospital Stay: Payer: Medicare HMO | Attending: Nurse Practitioner | Admitting: Nurse Practitioner

## 2020-04-06 ENCOUNTER — Inpatient Hospital Stay: Payer: Medicare HMO

## 2020-04-06 ENCOUNTER — Encounter: Payer: Self-pay | Admitting: Nurse Practitioner

## 2020-04-06 VITALS — BP 165/41 | HR 87 | Temp 99.2°F | Resp 18 | Ht 62.0 in | Wt 165.6 lb

## 2020-04-06 VITALS — BP 165/41 | HR 71

## 2020-04-06 DIAGNOSIS — Z452 Encounter for adjustment and management of vascular access device: Secondary | ICD-10-CM | POA: Insufficient documentation

## 2020-04-06 DIAGNOSIS — E119 Type 2 diabetes mellitus without complications: Secondary | ICD-10-CM | POA: Diagnosis not present

## 2020-04-06 DIAGNOSIS — Z79811 Long term (current) use of aromatase inhibitors: Secondary | ICD-10-CM | POA: Insufficient documentation

## 2020-04-06 DIAGNOSIS — Z170 Estrogen receptor positive status [ER+]: Secondary | ICD-10-CM | POA: Diagnosis not present

## 2020-04-06 DIAGNOSIS — C50912 Malignant neoplasm of unspecified site of left female breast: Secondary | ICD-10-CM | POA: Diagnosis not present

## 2020-04-06 DIAGNOSIS — I4891 Unspecified atrial fibrillation: Secondary | ICD-10-CM | POA: Diagnosis not present

## 2020-04-06 DIAGNOSIS — Z95828 Presence of other vascular implants and grafts: Secondary | ICD-10-CM

## 2020-04-06 DIAGNOSIS — C50412 Malignant neoplasm of upper-outer quadrant of left female breast: Secondary | ICD-10-CM

## 2020-04-06 DIAGNOSIS — C9201 Acute myeloblastic leukemia, in remission: Secondary | ICD-10-CM | POA: Diagnosis not present

## 2020-04-06 LAB — CBC WITH DIFFERENTIAL/PLATELET
Abs Immature Granulocytes: 0.02 10*3/uL (ref 0.00–0.07)
Basophils Absolute: 0 10*3/uL (ref 0.0–0.1)
Basophils Relative: 0 %
Eosinophils Absolute: 0.1 10*3/uL (ref 0.0–0.5)
Eosinophils Relative: 2 %
HCT: 31.6 % — ABNORMAL LOW (ref 36.0–46.0)
Hemoglobin: 10.5 g/dL — ABNORMAL LOW (ref 12.0–15.0)
Immature Granulocytes: 1 %
Lymphocytes Relative: 24 %
Lymphs Abs: 1.1 10*3/uL (ref 0.7–4.0)
MCH: 31.6 pg (ref 26.0–34.0)
MCHC: 33.2 g/dL (ref 30.0–36.0)
MCV: 95.2 fL (ref 80.0–100.0)
Monocytes Absolute: 0.5 10*3/uL (ref 0.1–1.0)
Monocytes Relative: 10 %
Neutro Abs: 2.7 10*3/uL (ref 1.7–7.7)
Neutrophils Relative %: 63 %
Platelets: 123 10*3/uL — ABNORMAL LOW (ref 150–400)
RBC: 3.32 MIL/uL — ABNORMAL LOW (ref 3.87–5.11)
RDW: 12.8 % (ref 11.5–15.5)
WBC: 4.3 10*3/uL (ref 4.0–10.5)
nRBC: 0 % (ref 0.0–0.2)

## 2020-04-06 MED ORDER — HEPARIN SOD (PORK) LOCK FLUSH 100 UNIT/ML IV SOLN
500.0000 [IU] | Freq: Once | INTRAVENOUS | Status: AC | PRN
  Administered 2020-04-06: 500 [IU] via INTRAVENOUS
  Filled 2020-04-06: qty 5

## 2020-04-06 MED ORDER — SODIUM CHLORIDE 0.9% FLUSH
10.0000 mL | INTRAVENOUS | Status: DC | PRN
  Administered 2020-04-06: 16:00:00 10 mL via INTRAVENOUS
  Filled 2020-04-06: qty 10

## 2020-04-06 MED ORDER — SODIUM CHLORIDE 0.9 % IJ SOLN
10.0000 mL | INTRAMUSCULAR | Status: DC | PRN
  Filled 2020-04-06: qty 10

## 2020-04-06 NOTE — Progress Notes (Addendum)
  [facility] OFFICE PROGRESS NOTE   Diagnosis: AML, breast cancer  INTERVAL HISTORY:   Ms. Jankovich returns as scheduled.  She continues Femara.  No hot flashes.  No joint pain.  No change in baseline back pain.  She reports a good appetite.  She continues to have periodic frequent/loose stools.  No nausea or vomiting.  No bleeding.  No recent infections.  She has completed both Covid vaccine injections.  Objective:  Vital signs in last 24 hours:  Blood pressure (!) 165/41, pulse 87, temperature 99.2 F (37.3 C), temperature source Oral, resp. rate 18, height '5\' 2"'$  (1.575 m), weight 165 lb 9.6 oz (75.1 kg), SpO2 99 %.    HEENT: No thrush or ulcers. Lymphatics: No palpable cervical, supraclavicular or axillary lymph nodes. Resp: Lungs clear bilaterally.  Heart sounds are distant. Cardio: Regular rate and rhythm. GI: Abdomen soft and nontender.  No hepatomegaly. Vascular: Trace edema at the lower legs bilaterally. Breasts: No masses palpated in either breast.  Lumpectomy scar left upper outer quadrant. Port-A-Cath without erythema.  Lab Results:  Lab Results  Component Value Date   WBC 4.8 12/12/2019   HGB 11.3 (L) 12/12/2019   HCT 34.1 (L) 12/12/2019   MCV 94.7 12/12/2019   PLT 140 (L) 12/12/2019   NEUTROABS 3.0 12/12/2019    Imaging:  No results found.  Medications: I have reviewed the patient's current medications.  Assessment/Plan: 1. 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  Status post cycle 3 high-dose araC consolidation 08/22/2014.  Bone marrow 09/15/2014 showed necrotic marrow with minute focus of  myeloid precursors. No evidence of viable acute leukemia. 2.History of pancytopenia secondary to chemotherapy.  3. C. difficile colitis-06/09/2014 treated with Flagyl  4. Diabetes  5. renal insufficiency  6. history of hypokalemia and hypomagnesemia. 7. history of atrial fibrillation  9. history of coronary artery disease  10. Hospitalization with febrile neutropenia/sepsis secondary to Escherichia coli bacteremia 09/02/2014. 11. Shingles involving the right back October 2015. 12. Meningioma on brain CT 05/24/2014 and brain MRI 05/24/2014 13. Hospitalization09/17/2018 through 08/28/2017 with acute CHF. 14.Admission 11/21/2017 with expressive aphasia, seizure activity noted on EEG, placed on Keppra 15.  Left breast cancer, T1NX, status post a left lumpectomy 1116 2020-1.5 cm, grade 2, associated with high-grade DCIS with necrosis, ER 95%, PR 95%, HER-2 negative, KI-6710%  Letrozole 11/02/2019  Disposition: Ms. Nickell appears unchanged.  She continues Femara.  We are referring her for a bone density study.  She will continue calcium and vitamin D.  She plans to discuss the increased stool frequency/diarrhea with her PCP at upcoming appointment.  She was seen at [facility] 02/24/2020, now on annual follow-up there.  We will follow-up on the CBC from today.  She will return for a Port-A-Cath flush in 8 weeks.  She will return for lab, Port-A-Cath flush and follow-up in 4 months.  She will contact the office in the interim with any problems.   Ned Card ANP/GNP-BC   04/06/2020  4:29 PM

## 2020-04-09 ENCOUNTER — Telehealth: Payer: Self-pay | Admitting: Nurse Practitioner

## 2020-04-09 ENCOUNTER — Telehealth: Payer: Self-pay | Admitting: Internal Medicine

## 2020-04-09 NOTE — Chronic Care Management (AMB) (Signed)
  Chronic Care Management   Outreach Note  04/09/2020 Name: [name] MRN: [id number] DOB: [birth year]  Referred by: Isaac Bliss, Rayford Halsted, MD Reason for referral : Chronic Care Management   An unsuccessful telephone outreach was attempted today. The patient was referred to the pharmacist for assistance with care management and care coordination.   Follow Up Plan:   [place]

## 2020-04-09 NOTE — Telephone Encounter (Signed)
Scheduled per los. Called and left msg. Mailed printout  °

## 2020-04-12 ENCOUNTER — Other Ambulatory Visit: Payer: Self-pay

## 2020-04-13 ENCOUNTER — Ambulatory Visit (INDEPENDENT_AMBULATORY_CARE_PROVIDER_SITE_OTHER): Payer: Medicare HMO | Admitting: Internal Medicine

## 2020-04-13 ENCOUNTER — Encounter: Payer: Self-pay | Admitting: Internal Medicine

## 2020-04-13 VITALS — BP 160/70 | HR 77 | Temp 97.2°F | Ht 62.0 in | Wt 164.7 lb

## 2020-04-13 DIAGNOSIS — E1165 Type 2 diabetes mellitus with hyperglycemia: Secondary | ICD-10-CM

## 2020-04-13 DIAGNOSIS — I10 Essential (primary) hypertension: Secondary | ICD-10-CM

## 2020-04-13 DIAGNOSIS — Z794 Long term (current) use of insulin: Secondary | ICD-10-CM | POA: Diagnosis not present

## 2020-04-13 LAB — POCT GLYCOSYLATED HEMOGLOBIN (HGB A1C): Hemoglobin A1C: 9.6 % — AB (ref 4.0–5.6)

## 2020-04-13 MED ORDER — TOUJEO MAX SOLOSTAR 300 UNIT/ML ~~LOC~~ SOPN: 20.0000 [IU] | PEN_INJECTOR | Freq: Every day | SUBCUTANEOUS | 3 refills | Status: DC

## 2020-04-13 MED ORDER — AMLODIPINE BESYLATE 10 MG PO TABS: 10.0000 mg | ORAL_TABLET | Freq: Every day | ORAL | 1 refills | Status: DC

## 2020-04-13 NOTE — Patient Instructions (Signed)
-  Nice seeing you today!!  -Increase amlodipine (norvasc) from 5 to 10 mg daily.  -Increase Toujeo (bedtime insulin to 20 units at bedtime).  -Schedule follow up in 3 months.

## 2020-04-13 NOTE — Progress Notes (Signed)
   Established Patient Office Visit     This visit occurred during the SARS-CoV-2 public health emergency.  Safety protocols were in place, including screening questions prior to the visit, additional usage of staff PPE, and extensive cleaning of exam room while observing appropriate contact time as indicated for disinfecting solutions.    CC/Reason for Visit: Follow-up chronic conditions  HPI: [name] is a 79 y.o. female who is coming in today for the above mentioned reasons.  She is here today mainly for follow-up of her diabetes and hypertension.  She has been seen most recently for narcotic refills per contract.  Her last A1c was 7.7 in November 2020.  At her recent cancer appointment she was noticed to have elevated blood pressure and was asked to follow-up with me.  Blood pressure in office today is 160/70.  She has not been checking her CBGs at home.  She feels well.  She has received both Covid vaccines.   Past Medical/Surgical History: Past Medical History:  Diagnosis Date  . acute myeloblastic leukemia (AML) dx'd 06/2014  . Acute myelogenous leukemia (HCC) 02/2014  . Acute pancreatitis   . Arthritis    "back" (09/17/2017)  . Atrial flutter (HCC)   . Cardiomyopathy (HCC)   . Chronic kidney disease, stage II (mild)   . Chronic lower back pain   . Colon polyps 04/29/2010   TUBULAR ADENOMA AND A SERRATED ADENOMA  . COPD (chronic obstructive pulmonary disease) (HCC)   . CORONARY ARTERY DISEASE 12/24/2007  . DIABETES MELLITUS, TYPE II 07/13/2007  . Family history of cervical cancer   . Family history of esophageal cancer   . Family history of leukemia   . Family history of lung cancer   . Family history of skin cancer   . History of blood transfusion 2015   "related to leukemia"  . History of kidney stones 12/24/2007  . History of uterine cancer   . HYPERLIPIDEMIA 12/24/2007  . HYPERTENSION 07/13/2007  . NSTEMI (non-ST elevated myocardial infarction) (HCC) 09/17/2017   . Unspecified disease of pancreas   . Uterine cancer (HCC)     Past Surgical History:  Procedure Laterality Date  . ABDOMINAL HYSTERECTOMY     "still have my ovaries"  . BREAST LUMPECTOMY WITH RADIOACTIVE SEED LOCALIZATION Left 10/24/2019   Procedure: LEFT BREAST LUMPECTOMY WITH RADIOACTIVE SEED LOCALIZATION;  Surgeon: Toth, Paul III, MD;  Location: [facility];  Service: General;  Laterality: Left;  . CARDIAC CATHETERIZATION  2002  . CARDIAC CATHETERIZATION  09/17/2017  . CATARACT EXTRACTION W/ INTRAOCULAR LENS  IMPLANT, BILATERAL Bilateral   . COLONOSCOPY W/ BIOPSIES AND POLYPECTOMY  2011  . CORONARY STENT INTERVENTION N/A 09/21/2017   Procedure: CORONARY STENT INTERVENTION;  Surgeon: Kelly, Thomas A, MD;  Location: [facility];  Service: Cardiovascular;  Laterality: N/A;  . DILATION AND CURETTAGE OF UTERUS    . EXCISIONAL HEMORRHOIDECTOMY  X 2  . GANGLION CYST EXCISION Left X 3  . INTRAVASCULAR PRESSURE WIRE/FFR STUDY N/A 09/17/2017   Procedure: INTRAVASCULAR PRESSURE WIRE/FFR STUDY;  Surgeon: End, Christopher, MD;  Location: [facility];  Service: Cardiovascular;  Laterality: N/A;  . NASAL SINUS SURGERY    . PORTA CATH INSERTION Right 2013  . RIGHT/LEFT HEART CATH AND CORONARY ANGIOGRAPHY N/A 09/17/2017   Procedure: RIGHT/LEFT HEART CATH AND CORONARY ANGIOGRAPHY;  Surgeon: End, Christopher, MD;  Location: [facility];  Service: Cardiovascular;  Laterality: N/A;  . TUBAL LIGATION        Social History:  reports that she quit smoking about 19 years ago. Her smoking use included cigarettes. She has a 20.00 pack-year smoking history. She has never used smokeless tobacco. She reports that she does not drink alcohol or use drugs.  Allergies: No Known Allergies  Family History:  Family History  Problem Relation Age of Onset  . COPD Father   . Esophageal cancer Father   . Breast cancer Sister 34  . Lung cancer Sister   . Esophageal cancer  Brother   . Suicidality Brother   . Lung cancer Sister   . Lung cancer Sister   . Cervical cancer Sister   . Congestive Heart Failure Mother   . Heart attack Maternal Grandmother   . Skin cancer Maternal Grandfather   . Heart attack Maternal Grandfather   . Stroke Paternal Grandmother 61  . Heart attack Paternal Grandfather   . Cancer Maternal Uncle        unknown type, dx. >65  . Cancer Paternal Aunt        unknown type, dx. >50  . Cancer Paternal Uncle        unknown type, dx. >50  . Cirrhosis Maternal Uncle   . Skin cancer Daughter   . Leukemia Cousin        maternal first cousin; dx. >50, in remission  . Cancer Niece        unknown type behind her ear, dx. 30s/40s  . Colon cancer Neg Hx      Current Outpatient Medications:  .  amLODipine (NORVASC) 10 MG tablet, Take 1 tablet (10 mg total) by mouth daily., Disp: 90 tablet, Rfl: 1 .  aspirin EC 81 MG tablet, Take 1 tablet (81 mg total) by mouth daily., Disp: , Rfl:  .  atorvastatin (LIPITOR) 40 MG tablet, Take 1 tablet (40 mg total) by mouth daily., Disp: 90 tablet, Rfl: 1 .  Blood Glucose Monitoring Suppl (TRUE METRIX GO GLUCOSE METER) w/Device KIT, 30 Units by Does not apply route daily., Disp: 1 kit, Rfl: 0 .  Calcium Carbonate-Vit D-Min (CALTRATE 600+D PLUS MINERALS) 600-800 MG-UNIT TABS, Take 1 tablet by mouth daily., Disp: , Rfl:  .  clopidogrel (PLAVIX) 75 MG tablet, Take 1 tablet (75 mg total) by mouth every evening., Disp: 90 tablet, Rfl: 1 .  cyanocobalamin (,VITAMIN B-12,) 1000 MCG/ML injection, ADMINISTER 1 ML(1000 MCG) IN THE MUSCLE every 30 days, Disp: 10 mL, Rfl: 10 .  fluticasone furoate-vilanterol (BREO ELLIPTA) 200-25 MCG/INH AEPB, Inhale 1 puff into the lungs daily., Disp: 120 each, Rfl: 5 .  furosemide (LASIX) 40 MG tablet, Take 1.5 tablets (60 mg total) by mouth daily., Disp: 180 tablet, Rfl: 3 .  glucose blood (TRUE METRIX BLOOD GLUCOSE TEST) test strip, TEST BLOOD GLUCOSE THREE TIMES DAILY, Disp: 300  strip, Rfl: 2 .  HYDROcodone-acetaminophen (NORCO) 5-325 MG tablet, Take 1 tablet by mouth every 6 (six) hours as needed for moderate pain., Disp: 120 tablet, Rfl: 0 .  HYDROcodone-acetaminophen (NORCO) 5-325 MG tablet, Take 1 tablet by mouth every 6 (six) hours as needed for moderate pain., Disp: 120 tablet, Rfl: 0 .  HYDROcodone-acetaminophen (NORCO) 5-325 MG tablet, Take 1 tablet by mouth every 6 (six) hours as needed for moderate pain., Disp: 120 tablet, Rfl: 0 .  insulin glargine, 2 Unit Dial, (TOUJEO MAX SOLOSTAR) 300 UNIT/ML Solostar Pen, Inject 20 Units into the skin at bedtime., Disp: 18 mL, Rfl: 3 .  Insulin Pen Needle (PEN NEEDLES 3/16") 31G X 5 MM  MISC, Use with Novolin R, Disp: 100 each, Rfl: 0 .  insulin regular (NOVOLIN R) 100 units/mL injection, Inject 0.12 mLs (12 Units total) into the skin 3 (three) times daily before meals. INJECT 10 UNITS SUBCUTANEOUSLY 3 TIMES DAILY. INJECT 4 EXTRA UNITS IF CBG OVER 200. INJECT 6 EXTRA UNITS IF CBG OVER 300, Disp: 40 mL, Rfl: 3 .  Insulin Syringe-Needle U-100 (DROPLET INSULIN SYRINGE) 31G X 5/16" 0.3 ML MISC, Use three times a day for glucose control.  Dx E11.9, Disp: 100 each, Rfl: 2 .  isosorbide mononitrate (IMDUR) 30 MG 24 hr tablet, TAKE 1 AND 1/2 TABLETS EVERY DAY, Disp: 135 tablet, Rfl: 1 .  letrozole (FEMARA) 2.5 MG tablet, Take 1 tablet (2.5 mg total) by mouth daily., Disp: 90 tablet, Rfl: 3 .  LORazepam (ATIVAN) 0.5 MG tablet, TAKE 1 TABLET BY MOUTH EVERY 8 HOURS AS NEEDED FOR ANXIETY(DO NOT EXCEED MORE THAN 2 TABS IN 24 HRS), Disp: 60 tablet, Rfl: 2 .  MAGNESIUM OXIDE 400 PO, Take 400 mg by mouth 2 (two) times daily., Disp: , Rfl:  .  metoprolol succinate (TOPROL-XL) 100 MG 24 hr tablet, TAKE 1 TABLET EVERY DAY  WITH  OR  IMMEDIATELY FOLLOWING A MEAL, Disp: 90 tablet, Rfl: 1 .  nitroGLYCERIN (NITROSTAT) 0.4 MG SL tablet, Place 1 tablet (0.4 mg total) under the tongue every 5 (five) minutes as needed for chest pain., Disp: 24 tablet, Rfl:  1 .  polyethylene glycol (MIRALAX / GLYCOLAX) 17 g packet, Take 17 g by mouth as needed., Disp: , Rfl:  .  potassium chloride (KLOR-CON) 10 MEQ tablet, Take 1 tablet (10 mEq total) by mouth daily., Disp: 90 tablet, Rfl: 1 .  promethazine (PHENERGAN) 12.5 MG tablet, Take 2 tablets (25 mg total) by mouth every 6 (six) hours as needed for nausea., Disp: 12 tablet, Rfl: 0 .  SYRINGE-NEEDLE, DISP, 3 ML (BD SAFETYGLIDE SYRINGE/NEEDLE) 25G X 1" 3 ML MISC, Use for B12 injections, Disp: 100 each, Rfl: 11 No current facility-administered medications for this visit.  Facility-Administered Medications Ordered in Other Visits:  .  sodium chloride 0.9 % 1,000 mL with magnesium sulfate 2 g infusion, , Intravenous, Continuous, Sherrill, Gary B, MD, Stopped at 08/04/14 1441  Review of Systems:  Constitutional: Denies fever, chills, diaphoresis, appetite change and fatigue.  HEENT: Denies photophobia, eye pain, redness, hearing loss, ear pain, congestion, sore throat, rhinorrhea, sneezing, mouth sores, trouble swallowing, neck pain, neck stiffness and tinnitus.   Respiratory: Denies SOB, DOE, cough, chest tightness,  and wheezing.   Cardiovascular: Denies chest pain, palpitations and leg swelling.  Gastrointestinal: Denies nausea, vomiting, abdominal pain, diarrhea, constipation, blood in stool and abdominal distention.  Genitourinary: Denies dysuria, urgency, frequency, hematuria, flank pain and difficulty urinating.  Endocrine: Denies: hot or cold intolerance, sweats, changes in hair or nails, polyuria, polydipsia. Musculoskeletal: Denies myalgias, back pain, joint swelling, arthralgias and gait problem.  Skin: Denies pallor, rash and wound.  Neurological: Denies dizziness, seizures, syncope, weakness, light-headedness, numbness and headaches.  Hematological: Denies adenopathy. Easy bruising, personal or family bleeding history  Psychiatric/Behavioral: Denies suicidal ideation, mood changes, confusion,  nervousness, sleep disturbance and agitation    Physical Exam: Vitals:   04/13/20 1500  BP: (!) 160/70  Pulse: 77  Temp: (!) 97.2 F (36.2 C)  TempSrc: Temporal  SpO2: 97%  Weight: 164 lb 11.2 oz (74.7 kg)  Height: 5' 2" (1.575 m)    Body mass index is 30.12 kg/m.   Constitutional: NAD, calm, comfortable   Eyes: PERRL, lids and conjunctivae normal, wears corrective lenses ENMT: Mucous membranes are moist.  Respiratory: clear to auscultation bilaterally, no wheezing, no crackles. Normal respiratory effort. No accessory muscle use.  Cardiovascular: Regular rate and rhythm, no murmurs / rubs / gallops.  1+ pitting lower extremity edema.  Neurologic: Grossly intact and nonfocal Psychiatric: Normal judgment and insight. Alert and oriented x 3. Normal mood.    Impression and Plan:  Type 2 diabetes mellitus with hyperglycemia, with long-term current use of insulin (HCC)  -Uncontrolled with an A1c of 9.6 in office today. -She is supposed to be on Toujeo 18 units at bedtime and regular insulin 12 units with each meal. -She has only been doing 14 of Toujeo and will frequently skip her morning regular insulin. -I will increase Toujeo to 20, have encouraged her to take her regular insulin as long as she is eating. -Have asked her to check CBGs and return in 3 months for follow-up.  Essential hypertension  -Uncontrolled. -Increase Norvasc from 5 to 10 mg. -Continue metoprolol 100 mg daily, Lasix 60 mg daily, isosorbide.    Patient Instructions  -Nice seeing you today!!  -Increase amlodipine (norvasc) from 5 to 10 mg daily.  -Increase Toujeo (bedtime insulin to 20 units at bedtime).  -Schedule follow up in 3 months.      Hernandez Acosta, MD [facility]   

## 2020-04-14 ENCOUNTER — Inpatient Hospital Stay: Payer: Medicare HMO

## 2020-04-25 ENCOUNTER — Telehealth: Payer: Self-pay | Admitting: Internal Medicine

## 2020-04-25 DIAGNOSIS — Z794 Long term (current) use of insulin: Secondary | ICD-10-CM

## 2020-04-25 DIAGNOSIS — E538 Deficiency of other specified B group vitamins: Secondary | ICD-10-CM

## 2020-04-25 DIAGNOSIS — C9501 Acute leukemia of unspecified cell type, in remission: Secondary | ICD-10-CM

## 2020-04-25 DIAGNOSIS — G459 Transient cerebral ischemic attack, unspecified: Secondary | ICD-10-CM

## 2020-04-25 NOTE — Chronic Care Management (AMB) (Signed)
  Chronic Care Management   Note  04/25/2020 Name: [name] MRN: [id number] DOB: 04-06-1941  [name] is a 79 y.o. year old female who is a primary care patient of Isaac Bliss, Rayford Halsted, MD. I reached out to [name] by phone today in response to a referral sent by [name]'s PCP, Isaac Bliss, Rayford Halsted, MD.   [name] was given information about Chronic Care Management services today including:  1. CCM service includes personalized support from designated clinical staff supervised by her physician, including individualized plan of care and coordination with other care providers 2. 24/7 contact phone numbers for assistance for urgent and routine care needs. 3. Service will only be billed when office clinical staff spend 20 minutes or more in a month to coordinate care. 4. Only one practitioner may furnish and bill the service in a calendar month. 5. The patient may stop CCM services at any time (effective at the end of the month) by phone call to the office staff.   Patient agreed to services and verbal consent obtained.   Follow up plan:   [place]

## 2020-05-02 ENCOUNTER — Other Ambulatory Visit: Payer: Self-pay

## 2020-05-02 ENCOUNTER — Ambulatory Visit (INDEPENDENT_AMBULATORY_CARE_PROVIDER_SITE_OTHER): Payer: Medicare HMO | Admitting: Internal Medicine

## 2020-05-02 ENCOUNTER — Encounter: Payer: Self-pay | Admitting: Internal Medicine

## 2020-05-02 DIAGNOSIS — Z794 Long term (current) use of insulin: Secondary | ICD-10-CM | POA: Diagnosis not present

## 2020-05-02 DIAGNOSIS — E1165 Type 2 diabetes mellitus with hyperglycemia: Secondary | ICD-10-CM | POA: Diagnosis not present

## 2020-05-02 MED ORDER — INSULIN PEN NEEDLE 31G X 5 MM MISC: 1.0000 | 3 refills | Status: DC

## 2020-05-02 MED ORDER — INSULIN ASPART 100 UNIT/ML ~~LOC~~ SOLN
12.0000 [IU] | Freq: Three times a day (TID) | SUBCUTANEOUS | 9 refills | Status: DC
Start: 2020-05-02 — End: 2020-08-23

## 2020-05-02 NOTE — Progress Notes (Signed)
Name: [name]  Age/ Sex: 79 y.o., female   MRN/ DOB: [id number], 1941-10-15     PCP: [name], Catherine Halsted, MD   Reason for Endocrinology Evaluation: Type 2 Diabetes Mellitus  Initial Endocrine Consultative Visit: 05/04/2019    PATIENT IDENTIFIER: [name] is a 79 y.o. female with a past medical history of T2DM, HTN, CAD, CHF , Hx of pancreatitis. The patient has followed with Endocrinology clinic since 05/04/2019 for consultative assistance with management of her diabetes.  DIABETIC HISTORY:  [name] was diagnosed with T2DM in 2008, she was on metformin, SU and pioglitazone has been on and started insulin therapy many years ago. Her hemoglobin A1c has ranged from 6.6% in 2015, peaking at 12.4% in 2018.  She continues to work at an [employer] place.   SUBJECTIVE:   During the last visit (10/26/2019): A1c was 7.0 % , we reduced toujeo and continued regular insulin   Today (05/02/2020): [name] is here for a follow up on diabetes management. She has not been to our office in 6 months.  She checks her blood sugars occasionally  The patient has not had hypoglycemic episodes since the last clinic visit.  She eats 2 meals a day , does not eat breakfast    She continues to have LE edema and sob.   HOME DIABETES REGIMEN:  Toujeo 20 units QHS  Novolin- R  12 units TID QAC     Glucose LOG:  98-520 mg/dL     HISTORY:  Past Medical History:  Past Medical History:  Diagnosis Date  . acute myeloblastic leukemia (AML) dx'd 06/2014  . Acute myelogenous leukemia ([place]) 02/2014  . Acute pancreatitis   . Arthritis    "back" (09/17/2017)  . Atrial flutter ([place])   . Cardiomyopathy ([place])   . Chronic kidney disease, stage II (mild)   . Chronic lower back pain   . Colon polyps 04/29/2010   TUBULAR ADENOMA AND A SERRATED ADENOMA  . COPD (chronic obstructive pulmonary disease) ([place])   . CORONARY ARTERY DISEASE 12/24/2007  . DIABETES MELLITUS, TYPE II 07/13/2007  . Family  history of cervical cancer   . Family history of esophageal cancer   . Family history of leukemia   . Family history of lung cancer   . Family history of skin cancer   . History of blood transfusion 2015   "related to leukemia"  . History of kidney stones 12/24/2007  . History of uterine cancer   . HYPERLIPIDEMIA 12/24/2007  . HYPERTENSION 07/13/2007  . NSTEMI (non-ST elevated myocardial infarction) ([place]) 09/17/2017  . Unspecified disease of pancreas   . Uterine cancer [facility])    Past Surgical History:  Past Surgical History:  Procedure Laterality Date  . ABDOMINAL HYSTERECTOMY     "still have my ovaries"  . BREAST LUMPECTOMY WITH RADIOACTIVE SEED LOCALIZATION Left 10/24/2019   Procedure: LEFT BREAST LUMPECTOMY WITH RADIOACTIVE SEED LOCALIZATION;  Surgeon: Jovita Kussmaul, MD;  Location: [place];  Service: General;  Laterality: Left;  . CARDIAC CATHETERIZATION  2002  . CARDIAC CATHETERIZATION  09/17/2017  . CATARACT EXTRACTION W/ INTRAOCULAR LENS  IMPLANT, BILATERAL Bilateral   . COLONOSCOPY W/ BIOPSIES AND POLYPECTOMY  2011  . CORONARY STENT INTERVENTION N/A 09/21/2017   Procedure: CORONARY STENT INTERVENTION;  Surgeon: Troy Sine, MD;  Location: [place] [facility];  Service: Cardiovascular;  Laterality: N/A;  . DILATION AND CURETTAGE OF UTERUS    . EXCISIONAL HEMORRHOIDECTOMY  X 2  . GANGLION CYST EXCISION Left X 3  . INTRAVASCULAR PRESSURE WIRE/FFR STUDY N/A 09/17/2017   Procedure: INTRAVASCULAR PRESSURE WIRE/FFR STUDY;  Surgeon: Nelva Bush, MD;  Location: [place] [facility];  Service: Cardiovascular;  Laterality: N/A;  . NASAL SINUS SURGERY    . PORTA CATH INSERTION Right 2013  . RIGHT/LEFT HEART CATH AND CORONARY ANGIOGRAPHY N/A 09/17/2017   Procedure: RIGHT/LEFT HEART CATH AND CORONARY ANGIOGRAPHY;  Surgeon: Nelva Bush, MD;  Location: [place] [facility];  Service: Cardiovascular;  Laterality: N/A;  . TUBAL LIGATION     Social History:  reports  that she quit smoking about 19 years ago. Her smoking use included cigarettes. She has a 20.00 pack-year smoking history. She has never used smokeless tobacco. She reports that she does not drink alcohol or use drugs. Family History:  Family History  Problem Relation Age of Onset  . COPD Father   . Esophageal cancer Father   . Breast cancer Sister 77  . Lung cancer Sister   . Esophageal cancer Brother   . Suicidality Brother   . Lung cancer Sister   . Lung cancer Sister   . Cervical cancer Sister   . Congestive Heart Failure Mother   . Heart attack Maternal Grandmother   . Skin cancer Maternal Grandfather   . Heart attack Maternal Grandfather   . Stroke Paternal Grandmother 60  . Heart attack Paternal Grandfather   . Cancer Maternal Uncle        unknown type, dx. >65  . Cancer Paternal Aunt        unknown type, dx. >50  . Cancer Paternal Uncle        unknown type, dx. >50  . Cirrhosis Maternal Uncle   . Skin cancer Daughter   . Leukemia Cousin        maternal first cousin; dx. >50, in remission  . Cancer Niece        unknown type behind her ear, dx. 30s/40s  . Colon cancer Neg Hx      HOME MEDICATIONS: Allergies as of 05/02/2020   No Known Allergies     Medication List       Accurate as of May 02, 2020  3:54 PM. If you have any questions, ask your nurse or doctor.        amLODipine 10 MG tablet Commonly known as: NORVASC Take 1 tablet (10 mg total) by mouth daily.   aspirin EC 81 MG tablet Take 1 tablet (81 mg total) by mouth daily.   atorvastatin 40 MG tablet Commonly known as: LIPITOR Take 1 tablet (40 mg total) by mouth daily.   BD SafetyGlide Syringe/Needle 25G X 1" 3 ML Misc Generic drug: SYRINGE-NEEDLE (DISP) 3 ML Use for B12 injections   Caltrate 600+D Plus Minerals 600-800 MG-UNIT Tabs Take 1 tablet by mouth daily.   clopidogrel 75 MG tablet Commonly known as: PLAVIX Take 1 tablet (75 mg total) by mouth every evening.   cyanocobalamin 1000  MCG/ML injection Commonly known as: (VITAMIN B-12) ADMINISTER 1 ML(1000 MCG) IN THE MUSCLE every 30 days   fluticasone furoate-vilanterol 200-25 MCG/INH Aepb Commonly known as: Breo Ellipta Inhale 1 puff into the lungs daily.   furosemide 40 MG tablet Commonly known as: LASIX Take 1.5 tablets (60 mg total) by mouth daily.   HYDROcodone-acetaminophen 5-325 MG tablet Commonly known as: Norco Take 1 tablet by mouth every 6 (six) hours as needed for moderate pain.   HYDROcodone-acetaminophen 5-325 MG tablet  Commonly known as: Norco Take 1 tablet by mouth every 6 (six) hours as needed for moderate pain.   HYDROcodone-acetaminophen 5-325 MG tablet Commonly known as: Norco Take 1 tablet by mouth every 6 (six) hours as needed for moderate pain.   insulin regular 100 units/mL injection Commonly known as: NovoLIN R Inject 0.12 mLs (12 Units total) into the skin 3 (three) times daily before meals. INJECT 10 UNITS SUBCUTANEOUSLY 3 TIMES DAILY. INJECT 4 EXTRA UNITS IF CBG OVER 200. INJECT 6 EXTRA UNITS IF CBG OVER 300   Insulin Syringe-Needle U-100 31G X 5/16" 0.3 ML Misc Commonly known as: Droplet Insulin Syringe Use three times a day for glucose control.  Dx E11.9   isosorbide mononitrate 30 MG 24 hr tablet Commonly known as: IMDUR TAKE 1 AND 1/2 TABLETS EVERY DAY   letrozole 2.5 MG tablet Commonly known as: FEMARA Take 1 tablet (2.5 mg total) by mouth daily.   LORazepam 0.5 MG tablet Commonly known as: ATIVAN TAKE 1 TABLET BY MOUTH EVERY 8 HOURS AS NEEDED FOR ANXIETY(DO NOT EXCEED MORE THAN 2 TABS IN 24 HRS)   MAGNESIUM OXIDE 400 PO Take 400 mg by mouth 2 (two) times daily.   metoprolol succinate 100 MG 24 hr tablet Commonly known as: TOPROL-XL TAKE 1 TABLET EVERY DAY  WITH  OR  IMMEDIATELY FOLLOWING A MEAL   nitroGLYCERIN 0.4 MG SL tablet Commonly known as: NITROSTAT Place 1 tablet (0.4 mg total) under the tongue every 5 (five) minutes as needed for chest pain.   Pen  Needles 3/16" 31G X 5 MM Misc Use with Novolin R   polyethylene glycol 17 g packet Commonly known as: MIRALAX / GLYCOLAX Take 17 g by mouth as needed.   potassium chloride 10 MEQ tablet Commonly known as: KLOR-CON Take 1 tablet (10 mEq total) by mouth daily.   promethazine 12.5 MG tablet Commonly known as: PHENERGAN Take 2 tablets (25 mg total) by mouth every 6 (six) hours as needed for nausea.   Toujeo Max SoloStar 300 UNIT/ML Solostar Pen Generic drug: insulin glargine (2 Unit Dial) Inject 20 Units into the skin at bedtime.   True Metrix Blood Glucose Test test strip Generic drug: glucose blood TEST BLOOD GLUCOSE THREE TIMES DAILY   True Metrix Go Glucose Meter w/Device Kit 30 Units by Does not apply route daily.        OBJECTIVE:   Vital Signs: BP (!) 146/68 (BP Location: Left Arm, Patient Position: Sitting, Cuff Size: Normal)   Pulse 78   Temp 98.4 F (36.9 C)   Ht _0  (1.575 m)   Wt 174 lb 9.6 oz (79.2 kg)   SpO2 98%   BMI 31.93 kg/m   Wt Readings from Last 3 Encounters:  05/02/20 174 lb 9.6 oz (79.2 kg)  04/13/20 164 lb 11.2 oz (74.7 kg)  04/06/20 165 lb 9.6 oz (75.1 kg)     Exam: General: Pt appears well and is in NAD  Lungs: Clear with good BS bilat with no rales, rhonchi, or wheezes  Heart: RRR with normal S1 and S2 and no gallops; no murmurs; no rub  Abdomen: Normoactive bowel sounds, soft, nontender, without masses or organomegaly palpable  Extremities: 1+  pretibial edema.   Neuro: MS is good with appropriate affect, pt is alert and Ox3       DM foot exam: 05/04/2019 The skin of the feet is intact without sores or ulcerations. The pedal pulses are 2+ on right and 2+ on left.  The sensation is intact to a screening 5.07, 10 gram monofilament bilaterally       DATA REVIEWED:  Lab Results  Component Value Date   HGBA1C 9.6 (A) 04/13/2020   HGBA1C 7.7 (A) 10/26/2019   HGBA1C 7.0 (A) 07/01/2019   Lab Results  Component Value Date    MICROALBUR 43.5 (H) 10/27/2018   LDLCALC 60 02/17/2018   CREATININE 1.26 (H) 10/20/2019   Lab Results  Component Value Date   MICRALBCREAT 101.4 (H) 10/27/2018     Lab Results  Component Value Date   CHOL 130 02/17/2018   HDL 38 (L) 02/17/2018   LDLCALC 60 02/17/2018   TRIG 161 (H) 02/17/2018   CHOLHDL 3.4 02/17/2018   In-office Bg 112 mg/dL  ASSESSMENT / PLAN / RECOMMENDATIONS:   1) Type 2 Diabetes Mellitus, Poorly controlled, With CKD III and Macrovascular complications - Most recent A1c of 9.6 %. Goal A1c < 7.5 %.     - Worsening glycemic control is due to medication non-adherence ( occasional forgetfulness ) as well as dietary indiscretions.  - Pt states she has been doing better dietary changes and medication intake. -  Her in-office BG today ( postlunch) was 112 mg/dL after taking 12 units of regular insulin which is optimal . Pt is having hypoglycemia in the mornings , this is due to basal insulin.  - I have also recommended switching her regular insulin to Novolog  Due to better safety profile in the setting of CKD III - I am also going to provide her with Correctional scale      MEDICATIONS: Decrease Toujeo to 16 units daily  Stop Novolin-R Start Novolog at 12 units [facility] CF: Novolog ( BG - 140/25)   EDUCATION / INSTRUCTIONS: BG monitoring instructions: Patient is instructed to check her blood sugars 3-4 times a day, before meals and bedtime . Call [place] [facility] if: BG persistently < 70  I reviewed the Rule of 15 for the treatment of hypoglycemia in detail with the patient. Literature supplied.    F/U in 3 months    Signed electronically by: Mack Guise, MD  [facility] End[facility]  [facility] [address] [place], [place] [address] Phone: [phone] FAX: [phone]   CC: [name], Catherine Halsted, MD [place] [address] Phone: [phone]  Fax:  [phone]  Return to Endocrinology clinic as below: Future Appointments  Date Time Provider [place]  05/31/2020  3:45 PM Ferguson Wolsey [facility] None  06/05/2020  3:30 PM [facility] CCM PHARMACIST 2 [facility] PEC  06/21/2020  4:00 PM [facility] DX DEXA 1 [facility] GI-BREAST CE  07/18/2020  3:00 PM Erline Hau, MD [facility] PEC  07/24/2020  2:45 PM [facility] LAB 2 [facility] None  07/24/2020  3:00 PM [facility] [place] FLUSH [facility] None  07/24/2020  3:30 PM Ladell Pier, MD [facility] None

## 2020-05-02 NOTE — Patient Instructions (Addendum)
-   Decrease Toujeo to 16  units daily  - Stop Novolin-R (regular insulin) - Start Novolog 12 units with each meal    Novolog correctional insulin: ADD extra units on insulin to your meal-time Novolog dose if your blood sugars are higher than 165. Use the scale below to help guide you:   Blood sugar before meal Number of units to inject  Less than 165 0 unit  166 -  200 1 units  201 -  235 2 units  236 -  270 3 units  271 -  305 4 units  306 -  340 5 units  341 -  375 6 units  376 -  410 7 units  411 -  445 8 units          HOW TO TREAT LOW BLOOD SUGARS (Blood sugar LESS THAN 70 MG/DL)  Please follow the RULE OF 15 for the treatment of hypoglycemia treatment (when your (blood sugars are less than 70 mg/dL)    STEP 1: Take 15 grams of carbohydrates when your blood sugar is low, which includes:   3-4 GLUCOSE TABS  OR  3-4 OZ OF JUICE OR REGULAR SODA OR  ONE TUBE OF GLUCOSE GEL     STEP 2: RECHECK blood sugar in 15 MINUTES STEP 3: If your blood sugar is still low at the 15 minute recheck --> then, go back to STEP 1 and treat AGAIN with another 15 grams of carbohydrates.

## 2020-05-04 MED ORDER — TOUJEO MAX SOLOSTAR 300 UNIT/ML ~~LOC~~ SOPN: 16.0000 [IU] | PEN_INJECTOR | Freq: Every day | SUBCUTANEOUS | 11 refills | Status: DC

## 2020-05-04 MED ORDER — INSULIN PEN NEEDLE 32G X 4 MM MISC: 1.0000 | Freq: Four times a day (QID) | 3 refills | Status: DC

## 2020-05-08 ENCOUNTER — Encounter (HOSPITAL_COMMUNITY): Payer: Self-pay

## 2020-05-08 ENCOUNTER — Emergency Department (HOSPITAL_COMMUNITY): Payer: Medicare HMO

## 2020-05-08 ENCOUNTER — Emergency Department (HOSPITAL_COMMUNITY)
Admission: EM | Admit: 2020-05-08 | Discharge: 2020-05-08 | Disposition: A | Discharge status: Home / Self Care | Payer: Medicare HMO | Attending: Emergency Medicine | Admitting: Emergency Medicine

## 2020-05-08 ENCOUNTER — Other Ambulatory Visit: Payer: Self-pay

## 2020-05-08 ENCOUNTER — Emergency Department (HOSPITAL_BASED_OUTPATIENT_CLINIC_OR_DEPARTMENT_OTHER): Payer: Medicare HMO

## 2020-05-08 DIAGNOSIS — I252 Old myocardial infarction: Secondary | ICD-10-CM | POA: Insufficient documentation

## 2020-05-08 DIAGNOSIS — R635 Abnormal weight gain: Secondary | ICD-10-CM | POA: Diagnosis not present

## 2020-05-08 DIAGNOSIS — R600 Localized edema: Secondary | ICD-10-CM | POA: Insufficient documentation

## 2020-05-08 DIAGNOSIS — Z8542 Personal history of malignant neoplasm of other parts of uterus: Secondary | ICD-10-CM | POA: Diagnosis not present

## 2020-05-08 DIAGNOSIS — J449 Chronic obstructive pulmonary disease, unspecified: Secondary | ICD-10-CM | POA: Insufficient documentation

## 2020-05-08 DIAGNOSIS — Z87891 Personal history of nicotine dependence: Secondary | ICD-10-CM | POA: Insufficient documentation

## 2020-05-08 DIAGNOSIS — R0602 Shortness of breath: Secondary | ICD-10-CM | POA: Diagnosis not present

## 2020-05-08 DIAGNOSIS — I110 Hypertensive heart disease with heart failure: Secondary | ICD-10-CM | POA: Diagnosis not present

## 2020-05-08 DIAGNOSIS — I129 Hypertensive chronic kidney disease with stage 1 through stage 4 chronic kidney disease, or unspecified chronic kidney disease: Secondary | ICD-10-CM | POA: Insufficient documentation

## 2020-05-08 DIAGNOSIS — Z20822 Contact with and (suspected) exposure to covid-19: Secondary | ICD-10-CM | POA: Insufficient documentation

## 2020-05-08 DIAGNOSIS — I509 Heart failure, unspecified: Secondary | ICD-10-CM

## 2020-05-08 DIAGNOSIS — Z856 Personal history of leukemia: Secondary | ICD-10-CM | POA: Diagnosis not present

## 2020-05-08 DIAGNOSIS — N182 Chronic kidney disease, stage 2 (mild): Secondary | ICD-10-CM | POA: Diagnosis not present

## 2020-05-08 DIAGNOSIS — R0600 Dyspnea, unspecified: Secondary | ICD-10-CM

## 2020-05-08 DIAGNOSIS — I2510 Atherosclerotic heart disease of native coronary artery without angina pectoris: Secondary | ICD-10-CM | POA: Diagnosis not present

## 2020-05-08 LAB — COMPREHENSIVE METABOLIC PANEL
ALT: 29 U/L (ref 0–44)
AST: 27 U/L (ref 15–41)
Albumin: 3.2 g/dL — ABNORMAL LOW (ref 3.5–5.0)
Alkaline Phosphatase: 106 U/L (ref 38–126)
Anion gap: 8 (ref 5–15)
BUN: 15 mg/dL (ref 8–23)
CO2: 25 mmol/L (ref 22–32)
Calcium: 8.5 mg/dL — ABNORMAL LOW (ref 8.9–10.3)
Chloride: 109 mmol/L (ref 98–111)
Creatinine, Ser: 1.26 mg/dL — ABNORMAL HIGH (ref 0.44–1.00)
GFR calc Af Amer: 47 mL/min — ABNORMAL LOW (ref >60)
GFR calc non Af Amer: 41 mL/min — ABNORMAL LOW (ref >60)
Glucose, Bld: 196 mg/dL — ABNORMAL HIGH (ref 70–99)
Potassium: 4.5 mmol/L (ref 3.5–5.1)
Sodium: 142 mmol/L (ref 135–145)
Total Bilirubin: 0.9 mg/dL (ref 0.3–1.2)
Total Protein: 6 g/dL — ABNORMAL LOW (ref 6.5–8.1)

## 2020-05-08 LAB — ECHOCARDIOGRAM LIMITED: Height: 62 in

## 2020-05-08 LAB — BRAIN NATRIURETIC PEPTIDE: B Natriuretic Peptide: 570.5 pg/mL — ABNORMAL HIGH (ref 0.0–100.0)

## 2020-05-08 LAB — D-DIMER, QUANTITATIVE: D-Dimer, Quant: 1.75 ug/mL-FEU — ABNORMAL HIGH (ref 0.00–0.50)

## 2020-05-08 LAB — CBC
HCT: 35 % — ABNORMAL LOW (ref 36.0–46.0)
Hemoglobin: 11.4 g/dL — ABNORMAL LOW (ref 12.0–15.0)
MCH: 31.8 pg (ref 26.0–34.0)
MCHC: 32.6 g/dL (ref 30.0–36.0)
MCV: 97.8 fL (ref 80.0–100.0)
Platelets: 124 10*3/uL — ABNORMAL LOW (ref 150–400)
RBC: 3.58 MIL/uL — ABNORMAL LOW (ref 3.87–5.11)
RDW: 13.6 % (ref 11.5–15.5)
WBC: 5.9 10*3/uL (ref 4.0–10.5)
nRBC: 0 % (ref 0.0–0.2)

## 2020-05-08 LAB — URINALYSIS, ROUTINE W REFLEX MICROSCOPIC
Bilirubin Urine: NEGATIVE
Glucose, UA: 150 mg/dL — AB
Ketones, ur: NEGATIVE mg/dL
Leukocytes,Ua: NEGATIVE
Nitrite: NEGATIVE
Protein, ur: 100 mg/dL — AB
Specific Gravity, Urine: 1.006 (ref 1.005–1.030)
pH: 5 (ref 5.0–8.0)

## 2020-05-08 LAB — TROPONIN I (HIGH SENSITIVITY)
Troponin I (High Sensitivity): 10 ng/L (ref <18)
Troponin I (High Sensitivity): 9 ng/L (ref <18)

## 2020-05-08 LAB — POC SARS CORONAVIRUS 2 AG -  ED: SARS Coronavirus 2 Ag: NEGATIVE

## 2020-05-08 MED ORDER — IOHEXOL 350 MG/ML SOLN
60.0000 mL | Freq: Once | INTRAVENOUS | Status: AC | PRN
  Administered 2020-05-08: 60 mL via INTRAVENOUS

## 2020-05-08 MED ORDER — IPRATROPIUM-ALBUTEROL 0.5-2.5 (3) MG/3ML IN SOLN: 3.0000 mL | Freq: Once | RESPIRATORY_TRACT | Status: DC

## 2020-05-08 MED ORDER — IPRATROPIUM-ALBUTEROL 20-100 MCG/ACT IN AERS
1.0000 | INHALATION_SPRAY | Freq: Once | RESPIRATORY_TRACT | Status: AC
  Administered 2020-05-08: 1 via RESPIRATORY_TRACT
  Filled 2020-05-08: qty 4

## 2020-05-08 MED ORDER — FUROSEMIDE 10 MG/ML IJ SOLN
60.0000 mg | Freq: Once | INTRAMUSCULAR | Status: AC
  Administered 2020-05-08: 60 mg via INTRAVENOUS
  Filled 2020-05-08: qty 6

## 2020-05-08 MED ORDER — METHYLPREDNISOLONE SODIUM SUCC 125 MG IJ SOLR
125.0000 mg | Freq: Once | INTRAMUSCULAR | Status: AC
  Administered 2020-05-08: 125 mg via INTRAVENOUS
  Filled 2020-05-08: qty 2

## 2020-05-08 NOTE — Progress Notes (Signed)
°  Echocardiogram 2D Echocardiogram has been performed.  Matilde Bash 05/08/2020, 4:04 PM

## 2020-05-08 NOTE — Discharge Instructions (Addendum)
You had a CT scan of your chest performed in the Emergency Department today.  The scan showed changes to the bones in your spine as well as changes to your liver.  Please follow up with Dr. [name] and your family doctor for further evaluation and work up.   Get rechecked immediately if you have any new or concerning symptoms.

## 2020-05-08 NOTE — ED Triage Notes (Addendum)
Patient arrived by POV complaining of SOB. Patient tachypneic and pale on arrival. No CP and no cough. Reports COPD and thinks related, also CHF. Patient request port to be accessed for labs

## 2020-05-08 NOTE — ED Notes (Signed)
Patient ambulated to RR without assist X2; steady gait.

## 2020-05-08 NOTE — Plan of Care (Addendum)
[facility]  Consult Note  Chief Complaint: dyspnea on exertion  History of Present Illness: Increased weight gain over the last month noticed increased LE edema bilaterally and increasing dyspnea over the last few days.  May 7th - 26th  By office notes she is up five kg , 74>79.  She has not weighed herself at home since then.  She endorses orthopnea for at least 3 days but no PND.  Shortness of breath with exertion mainly.    Meds:  Current Meds  Medication Sig  . amLODipine (NORVASC) 10 MG tablet Take 1 tablet (10 mg total) by mouth daily.  Marland Kitchen aspirin EC 81 MG tablet Take 1 tablet (81 mg total) by mouth daily.  Marland Kitchen atorvastatin (LIPITOR) 40 MG tablet Take 1 tablet (40 mg total) by mouth daily.  . Calcium Carbonate-Vit D-Min (CALTRATE 600+D PLUS MINERALS) 600-800 MG-UNIT TABS Take 1 tablet by mouth daily.  . clopidogrel (PLAVIX) 75 MG tablet Take 1 tablet (75 mg total) by mouth every evening.  . cyanocobalamin (,VITAMIN B-12,) 1000 MCG/ML injection ADMINISTER 1 ML(1000 MCG) IN THE MUSCLE every 30 days  . fluticasone furoate-vilanterol (BREO ELLIPTA) 200-25 MCG/INH AEPB Inhale 1 puff into the lungs daily.  . furosemide (LASIX) 40 MG tablet Take 1.5 tablets (60 mg total) by mouth daily.  Marland Kitchen HYDROcodone-acetaminophen (NORCO) 5-325 MG tablet Take 1 tablet by mouth every 6 (six) hours as needed for moderate pain.  Marland Kitchen insulin aspart (NOVOLOG) 100 UNIT/ML injection Inject 12 Units into the skin 3 (three) times daily before meals.  . insulin glargine, 2 Unit Dial, (TOUJEO MAX SOLOSTAR) 300 UNIT/ML Solostar Pen Inject 16 Units into the skin at bedtime. (Patient taking differently: Inject 17 Units into the skin at bedtime. )  . isosorbide mononitrate (IMDUR) 30 MG 24 hr tablet TAKE 1 AND 1/2 TABLETS EVERY DAY (Patient taking differently: Take 45 mg by mouth daily. TAKE 1 AND 1/2 TABLETS EVERY DAY= 45 mg)  . letrozole (FEMARA) 2.5 MG tablet Take 1 tablet (2.5 mg total) by mouth daily.  Marland Kitchen LORazepam  (ATIVAN) 0.5 MG tablet TAKE 1 TABLET BY MOUTH EVERY 8 HOURS AS NEEDED FOR ANXIETY(DO NOT EXCEED MORE THAN 2 TABS IN 24 HRS) (Patient taking differently: Take 0.5 mg by mouth every 8 (eight) hours as needed for anxiety. TAKE 1 TABLET BY MOUTH EVERY 8 HOURS AS NEEDED FOR ANXIETY(DO NOT EXCEED MORE THAN 2 TABS IN 24 HRS))  . MAGNESIUM OXIDE 400 PO Take 400 mg by mouth 2 (two) times daily.  . metoprolol succinate (TOPROL-XL) 100 MG 24 hr tablet TAKE 1 TABLET EVERY DAY  WITH  OR  IMMEDIATELY FOLLOWING A MEAL (Patient taking differently: Take 100 mg by mouth daily. TAKE 1 TABLET EVERY DAY  WITH  OR  IMMEDIATELY FOLLOWING A MEAL)  . nitroGLYCERIN (NITROSTAT) 0.4 MG SL tablet Place 1 tablet (0.4 mg total) under the tongue every 5 (five) minutes as needed for chest pain.  . polyethylene glycol (MIRALAX / GLYCOLAX) 17 g packet Take 17 g by mouth as needed.  . potassium chloride (KLOR-CON) 10 MEQ tablet Take 1 tablet (10 mEq total) by mouth daily.  . promethazine (PHENERGAN) 12.5 MG tablet Take 2 tablets (25 mg total) by mouth every 6 (six) hours as needed for nausea.    Physical Exam: Blood pressure (!) 170/63, pulse 79, temperature 98.4 F (36.9 C), temperature source Oral, resp. rate (!) 25, height 5\' 2"  (1.575 m), SpO2 96 %. Cardiac: JVD difficult to assess,  normal rate and rhythm, clear s1 and s2, no murmurs, rubs or gallops, 2+ LE edema bilaterally  Pulmonary: decreased bibasilar breath sounds R>L, not in distress Abdominal: soft and nontender Psych: Alert, conversant, in good spirits   Clinical Screening: (ALL ANSWERS MUST BE NO)  Based on current presentation is the patient likely to require: advanced diagnostics, advanced imaging (CT, MRI, nuclear stress testing), cardiac catheterization, EGD/colonoscopy, or lab monitoring not amendable to home monitoring (troponin, >q12 hour labs): NO.  Based on current presentation is the patient is likely to require: mechanical ventilation (invasive and  noninvasive, history of intubation) and/or vasoactive medications: NO.  Based on current presentation is the patient likely to require a surgical or IR procedure including but not limited to intraabdominal abscess drainage, percutaneous nephrostomy tube placement, thoracentesis for parapneumonic effusion, surgical wound debridement: NO.  Based on current presentation is the patient is likely to require: daily specialty consultation, blood transfusions, respiratory isolation/airborne precautions, active adjustments of opiates or IV pain medications: NO.  Does the patient have barriers that would make it unsafe to provide care in the home including but not limited to severe AMS, active substance use disorder, history of or high risk of noncompliance with primary treatment plan: NO.  Has the patient ever been intubated or do they have a new tracheostomy: NO.  Does the patient have an unstable arrhythmia: NO.  Is hemodialysis likely to be required (i.e. already on HD or newly anuric/sever ATN): NO.  Is there risk for inability to obtain IV access: NO.]  Social Screening: (ALL QUESTIONS MUST BE YES) Does the patient have a home recovery environment? YES.  Is the patient's home recovery environment in an eligible geography [facility])? YES.  Does the patient's home have water, electricity, bathroom, heat/ac, refrigerator? YES.  Does the patient feel safe at home? YES.  Are family/caregivers willing to participate, as needed, while the patient participates [facility].  Is there a person in the home (patient or other) willing/able to take vital signs and answer phone calls? YES.  Is the patient willing to put pets in a secure area while [facility] and affiliated staff are in the home? YES.  Is patient willing/able to participate in the [facility] (this includes [facility] affiliated staff entering the home, and associated services)?  YES.  Is the patient/patient's HCP willing/able to sign consent? YES.  Assessment / Plan:  Based on the HPI and information obtained the patient is a candidate for the [facility]. Consent has been signed and the patient has been provided with a copy.   Dyspnea on exertion: dyspnea on exertion appears to be related to volume overload however pt endorses improvement with nebulizer treatments and carries history of COPD.  No wheezing on my exam but has already received breathing treatments. Limited ECHO with suprisingly good EF, no significant valvular abnormalities and IVC around 50% collapsibility 1.8cm diameter, very small pericardial effusion.  She has small pleural effusions R>L, left effusion is very small.    -continue diuresis 80mg  lasix daily and reassess volume status/daily labs and adjust given mostly collapsible IVC. Can use vest readings to help guide as well -d/c pioglitazone -review medications, increase bp control with GDMT, titrate off amlodipine due to peripheral edema, favor spiro, losartan -daily labs beginning 6/2 -daily weights and I's and O's -continue BID nebulizer treatments duoneb therapy   The patient has been enrolled in the [facility]  program. [facility] has been notified and will present to the patient's house at 05/08/20.   Home health / DME needs: N/A  Medication needs: lasix  Other needs: possible lower extremity compression stockings  Patient's contact information:  [place] [address]  Please do not hesitate to call with questions/concerns.   Katherine Roan, MD 05/08/2020, 1:53 PM

## 2020-05-08 NOTE — ED Notes (Signed)
Pt ambulated to restroom without difficulty. Some exertional shortness of breath noted on way back.

## 2020-05-08 NOTE — ED Provider Notes (Signed)
[facility] Emergency Department Provider Note MRN:  [id number]  Arrival date & time: 05/08/20     Chief Complaint   Shortness of breath History of Present Illness   [name] is a 79 y.o. year-old female with a history of COPD, CHF presenting to the ED with chief complaint of shortness of breath.  Gradual onset shortness of breath over the past 2 or 3 days, much worse over the past 24 hours.  Endorsing 15 pound weight gain and increased swelling to the legs.  Denies chest pain, denies cough or fever, no abdominal pain.  Symptoms are worse with ambulation.  Moderate to severe in severity, constant, no other exacerbating or alleviating factors.  Review of Systems  A complete 10 system review of systems was obtained and all systems are negative except as noted in the HPI and PMH.   Patient's Health History    Past Medical History:  Diagnosis Date  . acute myeloblastic leukemia (AML) dx'd 06/2014  . Acute myelogenous leukemia ([place]) 02/2014  . Acute pancreatitis   . Arthritis    "back" (09/17/2017)  . Atrial flutter ([place])   . Cardiomyopathy ([place])   . Chronic kidney disease, stage II (mild)   . Chronic lower back pain   . Colon polyps 04/29/2010   TUBULAR ADENOMA AND A SERRATED ADENOMA  . COPD (chronic obstructive pulmonary disease) ([place])   . CORONARY ARTERY DISEASE 12/24/2007  . DIABETES MELLITUS, TYPE II 07/13/2007  . Family history of cervical cancer   . Family history of esophageal cancer   . Family history of leukemia   . Family history of lung cancer   . Family history of skin cancer   . History of blood transfusion 2015   "related to leukemia"  . History of kidney stones 12/24/2007  . History of uterine cancer   . HYPERLIPIDEMIA 12/24/2007  . HYPERTENSION 07/13/2007  . NSTEMI (non-ST elevated myocardial infarction) ([place]) 09/17/2017  . Unspecified disease of pancreas   . Uterine cancer [facility])     Past Surgical History:  Procedure Laterality Date   . ABDOMINAL HYSTERECTOMY     "still have my ovaries"  . BREAST LUMPECTOMY WITH RADIOACTIVE SEED LOCALIZATION Left 10/24/2019   Procedure: LEFT BREAST LUMPECTOMY WITH RADIOACTIVE SEED LOCALIZATION;  Surgeon: Jovita Kussmaul, MD;  Location: [place];  Service: General;  Laterality: Left;  . CARDIAC CATHETERIZATION  2002  . CARDIAC CATHETERIZATION  09/17/2017  . CATARACT EXTRACTION W/ INTRAOCULAR LENS  IMPLANT, BILATERAL Bilateral   . COLONOSCOPY W/ BIOPSIES AND POLYPECTOMY  2011  . CORONARY STENT INTERVENTION N/A 09/21/2017   Procedure: CORONARY STENT INTERVENTION;  Surgeon: Troy Sine, MD;  Location: [place] [facility];  Service: Cardiovascular;  Laterality: N/A;  . DILATION AND CURETTAGE OF UTERUS    . EXCISIONAL HEMORRHOIDECTOMY  X 2  . GANGLION CYST EXCISION Left X 3  . INTRAVASCULAR PRESSURE WIRE/FFR STUDY N/A 09/17/2017   Procedure: INTRAVASCULAR PRESSURE WIRE/FFR STUDY;  Surgeon: Nelva Bush, MD;  Location: [place] [facility];  Service: Cardiovascular;  Laterality: N/A;  . NASAL SINUS SURGERY    . PORTA CATH INSERTION Right 2013  . RIGHT/LEFT HEART CATH AND CORONARY ANGIOGRAPHY N/A 09/17/2017   Procedure: RIGHT/LEFT HEART CATH AND CORONARY ANGIOGRAPHY;  Surgeon: Nelva Bush, MD;  Location: [place] [facility];  Service: Cardiovascular;  Laterality: N/A;  . TUBAL LIGATION      Family History  Problem Relation Age of Onset  . COPD Father   .  Esophageal cancer Father   . Breast cancer Sister 75  . Lung cancer Sister   . Esophageal cancer Brother   . Suicidality Brother   . Lung cancer Sister   . Lung cancer Sister   . Cervical cancer Sister   . Congestive Heart Failure Mother   . Heart attack Maternal Grandmother   . Skin cancer Maternal Grandfather   . Heart attack Maternal Grandfather   . Stroke Paternal Grandmother 22  . Heart attack Paternal Grandfather   . Cancer Maternal Uncle        unknown type, dx. >65  . Cancer Paternal Aunt         unknown type, dx. >50  . Cancer Paternal Uncle        unknown type, dx. >50  . Cirrhosis Maternal Uncle   . Skin cancer Daughter   . Leukemia Cousin        maternal first cousin; dx. >50, in remission  . Cancer Niece        unknown type behind her ear, dx. 30s/40s  . Colon cancer Neg Hx     Social History   Socioeconomic History  . Marital status: Married    Spouse name: Not on file  . Number of children: Not on file  . Years of education: Not on file  . Highest education level: Not on file  Occupational History  . Occupation: works in a lab    Comment: makes glasses  Tobacco Use  . Smoking status: Former Smoker    Packs/day: 1.00    Years: 20.00    Pack years: 20.00    Types: Cigarettes    Quit date: 12/08/2000    Years since quitting: 19.4  . Smokeless tobacco: Never Used  Substance and Sexual Activity  . Alcohol use: No    Alcohol/week: 0.0 standard drinks  . Drug use: No  . Sexual activity: Never  Other Topics Concern  . 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 Difficulty of Paying Living Expenses:   Food Insecurity:   . Worried About Charity fundraiser in the Last Year:   . Arboriculturist in the Last Year:   Transportation Needs: No Transportation Needs  . Lack of Transportation (Medical): No  . Lack of Transportation (Non-Medical): No  Physical Activity:   . Days of Exercise per Week:   . Minutes of Exercise per Session:   Stress:   . Feeling of Stress :   Social Connections:   . Frequency of Communication with Friends and Family:   . Frequency of Social Gatherings with Friends and Family:   . Attends Religious Services:   . Active Member of Clubs or Organizations:   . Attends Archivist Meetings:   Marland Kitchen Marital Status:   Intimate Partner Violence: Not At Risk  . Fear of Current or Ex-Partner: No  . Emotionally Abused: No  .  Physically Abused: No  . Sexually Abused: No     Physical Exam   Vitals:   05/08/20 1345 05/08/20 1600  BP:    Pulse: 67 89  Resp: (!) 24 (!) 21  Temp:    SpO2: 93% 95%    CONSTITUTIONAL: Well-appearing, NAD NEURO:  Alert and oriented x 3, no focal deficits EYES:  eyes equal and reactive ENT/NECK:  no LAD, no JVD CARDIO: Regular rate,  well-perfused, normal S1 and S2 PULM: Poor air movement throughout GI/GU:  normal bowel sounds, non-distended, non-tender MSK/SPINE:  No gross deformities, no edema SKIN:  no rash, atraumatic PSYCH:  Appropriate speech and behavior  *Additional and/or pertinent findings included in MDM below  Diagnostic and Interventional Summary    EKG Interpretation  Date/Time:  Tuesday May 08 2020 09:29:11 EDT Ventricular Rate:  69 PR Interval:    QRS Duration: 84 QT Interval:  402 QTC Calculation: 430 R Axis:   106 Text Interpretation: Accelerated Junctional rhythm Rightward axis Low voltage QRS Nonspecific T wave abnormality Abnormal ECG Confirmed by Gerlene Fee [phone]) on 05/08/2020 9:42:20 AM      Labs Reviewed  CBC - Abnormal; Notable for the following components:      Result Value   RBC 3.58 (*)    Hemoglobin 11.4 (*)    HCT 35.0 (*)    Platelets 124 (*)    All other components within normal limits  COMPREHENSIVE METABOLIC PANEL - Abnormal; Notable for the following components:   Glucose, Bld 196 (*)    Creatinine, Ser 1.26 (*)    Calcium 8.5 (*)    Total Protein 6.0 (*)    Albumin 3.2 (*)    GFR calc non Af Amer 41 (*)    GFR calc Af Amer 47 (*)    All other components within normal limits  BRAIN NATRIURETIC PEPTIDE - Abnormal; Notable for the following components:   B Natriuretic Peptide 570.5 (*)    All other components within normal limits  SARS CORONAVIRUS 2 BY RT PCR (HOSPITAL ORDER, [place] [facility])  URINALYSIS, ROUTINE W REFLEX MICROSCOPIC  POC SARS CORONAVIRUS 2 AG -  ED  TROPONIN I (HIGH  SENSITIVITY)  TROPONIN I (HIGH SENSITIVITY)    DG Chest Port 1 View  Final Result      Medications  methylPREDNISolone sodium succinate (SOLU-MEDROL) 125 mg/2 mL injection 125 mg (125 mg Intravenous Given 05/08/20 0959)  Ipratropium-Albuterol (COMBIVENT) respimat 1 puff (1 puff Inhalation Given 05/08/20 1402)  furosemide (LASIX) injection 60 mg (60 mg Intravenous Given 05/08/20 1322)     Procedures  /  Critical Care Procedures  ED Course and Medical Decision Making  I have reviewed the triage vital signs, the nursing notes, and pertinent available records from the EMR.  Listed above are laboratory and imaging tests that I personally ordered, reviewed, and interpreted and then considered in my medical decision making (see below for details).      Unclear if CHF versus COPD exacerbation, awaiting chest x-ray, laboratory evaluation.  Poor air movement, providing albuterol, Solu-Medrol.  Despite her poor lung exam she has reassuring oxygenation and is hemodynamically stable.  EKG unchanged from prior.  Patient had mild improvement in her lung exam on reassessment, still functionally struggling.  Considered hospitalist admission but patient thought to be a good candidate for [facility] and hospital at home program.  Evaluated by the hospital at home team, plan is for patient to show urine output of 100 or 200 cc here after Lasix and discharge home thereafter.  Signed out to oncoming provider at shift change.  Barth Kirks. Sedonia Small, MD [place] [email or web address]  Final Clinical Impressions(s) / ED Diagnoses     ICD-10-CM   1. Acute on chronic congestive heart failure, unspecified heart failure type ([place])  I50.9     ED Discharge Orders    None       Discharge Instructions  Discussed with and Provided to Patient:   Discharge Instructions   None       Maudie Flakes, MD 05/08/20 [phone]

## 2020-05-09 DIAGNOSIS — I5042 Chronic combined systolic (congestive) and diastolic (congestive) heart failure: Secondary | ICD-10-CM | POA: Diagnosis not present

## 2020-05-09 DIAGNOSIS — E1122 Type 2 diabetes mellitus with diabetic chronic kidney disease: Secondary | ICD-10-CM | POA: Diagnosis not present

## 2020-05-10 DIAGNOSIS — I5042 Chronic combined systolic (congestive) and diastolic (congestive) heart failure: Secondary | ICD-10-CM | POA: Diagnosis not present

## 2020-05-10 DIAGNOSIS — E1122 Type 2 diabetes mellitus with diabetic chronic kidney disease: Secondary | ICD-10-CM | POA: Diagnosis not present

## 2020-05-11 DIAGNOSIS — I5042 Chronic combined systolic (congestive) and diastolic (congestive) heart failure: Secondary | ICD-10-CM | POA: Diagnosis not present

## 2020-05-11 DIAGNOSIS — E1122 Type 2 diabetes mellitus with diabetic chronic kidney disease: Secondary | ICD-10-CM | POA: Diagnosis not present

## 2020-05-12 DIAGNOSIS — I509 Heart failure, unspecified: Secondary | ICD-10-CM | POA: Diagnosis not present

## 2020-05-13 DIAGNOSIS — I509 Heart failure, unspecified: Secondary | ICD-10-CM | POA: Diagnosis not present

## 2020-05-15 ENCOUNTER — Telehealth: Payer: Self-pay | Admitting: *Deleted

## 2020-05-15 NOTE — Telephone Encounter (Addendum)
Left VM requesting MD to review her CT chest from ER visit on 05/08/20--may have cancer in bones. Requests a return call to discuss next steps. Forwarded CT report to Dr. [name] to review. Patient has called back again and wishes to speak w/MD. [name] that she was told she did not need tx for her breast cancer and is concerned this is metastasis from the breast cancer. Informed her that MD will have some answers when he has opportunity to review the report.

## 2020-05-16 ENCOUNTER — Other Ambulatory Visit: Payer: Self-pay | Admitting: Oncology

## 2020-05-16 DIAGNOSIS — C50412 Malignant neoplasm of upper-outer quadrant of left female breast: Secondary | ICD-10-CM

## 2020-05-16 DIAGNOSIS — Z170 Estrogen receptor positive status [ER+]: Secondary | ICD-10-CM

## 2020-05-17 ENCOUNTER — Other Ambulatory Visit: Payer: Self-pay | Admitting: Internal Medicine

## 2020-05-17 ENCOUNTER — Ambulatory Visit: Payer: Self-pay | Admitting: Internal Medicine

## 2020-05-17 DIAGNOSIS — M542 Cervicalgia: Secondary | ICD-10-CM

## 2020-05-17 DIAGNOSIS — F419 Anxiety disorder, unspecified: Secondary | ICD-10-CM

## 2020-05-17 NOTE — Telephone Encounter (Signed)
Pt calling in stating that she need for her hydrocodone-acetaminophen ([place]) 5-325 MG to be approved.  Pharm:  [facility] on [address].

## 2020-05-18 ENCOUNTER — Ambulatory Visit: Payer: Self-pay | Admitting: Internal Medicine

## 2020-05-18 ENCOUNTER — Telehealth: Payer: Self-pay | Admitting: *Deleted

## 2020-05-18 NOTE — Telephone Encounter (Signed)
Notified patient of MRI at [facility] on 06/08/20-arrive at 3:40 pm for 4:00 pm scan. She asking to add MRI of her stomach since she has some stomach pain with a BM. Informed her that MRI of stomach is not usually done. Suggests she wait and see if this resolves on its own.

## 2020-05-21 DIAGNOSIS — E871 Hypo-osmolality and hyponatremia: Secondary | ICD-10-CM | POA: Diagnosis not present

## 2020-05-21 DIAGNOSIS — I5042 Chronic combined systolic (congestive) and diastolic (congestive) heart failure: Secondary | ICD-10-CM | POA: Diagnosis not present

## 2020-05-21 DIAGNOSIS — E1122 Type 2 diabetes mellitus with diabetic chronic kidney disease: Secondary | ICD-10-CM | POA: Diagnosis not present

## 2020-05-22 ENCOUNTER — Telehealth: Payer: Self-pay | Admitting: Cardiovascular Disease

## 2020-05-22 MED ORDER — HYDROCODONE-ACETAMINOPHEN 5-325 MG PO TABS: 1.0000 | ORAL_TABLET | Freq: Four times a day (QID) | ORAL | 0 refills | Status: DC | PRN

## 2020-05-22 MED ORDER — LORAZEPAM 0.5 MG PO TABS: ORAL_TABLET | ORAL | 2 refills | Status: DC

## 2020-05-22 NOTE — Telephone Encounter (Signed)
Will forward to [place] for review ./cy

## 2020-05-22 NOTE — Telephone Encounter (Signed)
New Message   [name] is calling from [facility] to give an update.  06/02 and 06/03 and 06/04 gave 80 Lasix IV  Was not pulling off weight  06/08 switched Torsemide  40 mg daily  She is doing good with weight, current wt 162  Taking Potassium 10 2 x daily  She says they are having better results with the Torsemide  On 06/02 her wt was 170  They did draw labs yesterday  Calcium a little low, 8.5  They did put her on calcium, 600 2 x daily  Will continue to see her and discharge her on July 2nd.   Please advise

## 2020-05-22 NOTE — Telephone Encounter (Signed)
Patient had an office visit 04/13/20.  Hydrocodone and lorazepam was not filled at that time, because it was too early.   Okay to refill?

## 2020-05-22 NOTE — Telephone Encounter (Signed)
Refills sent

## 2020-05-22 NOTE — Telephone Encounter (Signed)
Looks like she may need a med refill visit.Marland KitchenMarland KitchenMarland Kitchen

## 2020-05-23 ENCOUNTER — Encounter: Payer: Self-pay | Admitting: Cardiovascular Disease

## 2020-05-23 DIAGNOSIS — I5042 Chronic combined systolic (congestive) and diastolic (congestive) heart failure: Secondary | ICD-10-CM | POA: Diagnosis not present

## 2020-05-23 DIAGNOSIS — E1122 Type 2 diabetes mellitus with diabetic chronic kidney disease: Secondary | ICD-10-CM | POA: Diagnosis not present

## 2020-05-24 NOTE — Telephone Encounter (Signed)
Spoke with Delrae Rend, RN from [employer]. She states pt was referred from ER for IV lasix therapy at home. No orders needed from Dr. Acie Fredrickson, just calling to give [place] an update. Doing twice weekly visits, pt has been stable. Delrae Rend will forward labs to Dr. Acie Fredrickson for appointment with pt on 6/23.  creatinine 1.64 Calcium 7.9 I advised that he will not be back in the office until Monday and will look forward to seeing the pt on Wednesday. Delrae Rend thanked me for the call and I thanked her for the update.

## 2020-05-25 ENCOUNTER — Encounter: Payer: Self-pay | Admitting: Cardiovascular Disease

## 2020-05-30 ENCOUNTER — Other Ambulatory Visit: Payer: Self-pay

## 2020-05-30 ENCOUNTER — Ambulatory Visit: Payer: Medicare HMO | Admitting: Cardiovascular Disease

## 2020-05-30 ENCOUNTER — Encounter: Payer: Self-pay | Admitting: Cardiovascular Disease

## 2020-05-30 VITALS — BP 124/56 | HR 72 | Ht 62.0 in | Wt 163.5 lb

## 2020-05-30 DIAGNOSIS — I5033 Acute on chronic diastolic (congestive) heart failure: Secondary | ICD-10-CM | POA: Diagnosis not present

## 2020-05-30 DIAGNOSIS — I10 Essential (primary) hypertension: Secondary | ICD-10-CM

## 2020-05-30 DIAGNOSIS — I25118 Atherosclerotic heart disease of native coronary artery with other forms of angina pectoris: Secondary | ICD-10-CM | POA: Diagnosis not present

## 2020-05-30 MED ORDER — TORSEMIDE 20 MG PO TABS: 40.0000 mg | ORAL_TABLET | Freq: Every day | ORAL | 3 refills | Status: DC

## 2020-05-30 NOTE — Progress Notes (Signed)
Cardiology Office Note:    Date:  05/30/2020   ID:  [name], DOB 1941/03/10, MRN [id number]  PCP:  Isaac Bliss, Rayford Halsted, MD  Cardiologist:  Lindzie Boxx   Referring MD: Isaac Bliss, Est[place]*   Chief Complaint  Patient presents with  . Congestive Heart Failure    History of Present Illness:    [name] is a 79 y.o. female with a hx of coronary artery disease and a non-ST segment elevation myocardial infarction.  She was admitted in #2018 with a non-ST segment elevation myocardial infarction in the setting of acute combined systolic and diastolic congestive heart failure.  Her ejection fraction was 40-45%.  Patient has chronic [name] disease and we treat her medically in an effort to avoid cardiac catheterization at that time.  She was seen in follow-up on October 10 and continued to have shortness of breath and occasional chest tightness.  We ultimately did do heart cath which showed severe three-vessel coronary artery disease.  She was seen by Dr. Servando Snare for surgical consultation.  She was thought to be too high risk for CABG. She subsequently had PCI to her right coronary artery.  He was found to have a moderate LAD stenosis with an FFR of 0.72 at that time.   She is been treated medically with the thought that we would intervene on the LAD if she continued to have episodes of chest pain and shortness of breath.  She is feeling well.   Some dyspnea.   No CP   May 30, 2020:  [name]  is seen today for follow-up of her coronary artery disease and chronic combined systolic and diastolic congestive heart failure.  She was seen in the emergency room for congestive heart failure on June 1.  She was not admitted but was sent home with the home health CHF nurses.  She received IV Lasix for several days. She had a recent echocardiogram performed May 08, 2020 which reveals normal left ventricular systolic function with EF of 55 to 60%. There is trivial mitral regurgitation.     She was changed from furosemide to torsemide and is still feeling quite well. Still has some leg edema.   Breathing is much better.  Tries to avoid salt  We discussed leg elevation and compression hose.   Labs from June 16 reveal a creatinine of 1.64.  Her potassium is normal. I considered trying an ARB but since her creatinine has gone up a little bit I suspect that she will be better off staying with what she is on now.  Past Medical History:  Diagnosis Date  . acute myeloblastic leukemia (AML) dx'd 06/2014  . Acute myelogenous leukemia ([place]) 02/2014  . Acute pancreatitis   . Arthritis    "back" (09/17/2017)  . Atrial flutter ([place])   . Cardiomyopathy ([place])   . Chronic [name] disease, stage II (mild)   . Chronic lower back pain   . Colon polyps 04/29/2010   TUBULAR ADENOMA AND A SERRATED ADENOMA  . COPD (chronic obstructive pulmonary disease) ([place])   . CORONARY ARTERY DISEASE 12/24/2007  . DIABETES MELLITUS, TYPE II 07/13/2007  . Family history of cervical cancer   . Family history of esophageal cancer   . Family history of leukemia   . Family history of lung cancer   . Family history of skin cancer   . History of blood transfusion 2015   "related to leukemia"  . History of [name] stones 12/24/2007  . History of uterine  cancer   . HYPERLIPIDEMIA 12/24/2007  . HYPERTENSION 07/13/2007  . NSTEMI (non-ST elevated myocardial infarction) ([place]) 09/17/2017  . Unspecified disease of pancreas   . Uterine cancer [facility])     Past Surgical History:  Procedure Laterality Date  . ABDOMINAL HYSTERECTOMY     "still have my ovaries"  . BREAST LUMPECTOMY WITH RADIOACTIVE SEED LOCALIZATION Left 10/24/2019   Procedure: LEFT BREAST LUMPECTOMY WITH RADIOACTIVE SEED LOCALIZATION;  Surgeon: Jovita Kussmaul, MD;  Location: [facility];  Service: General;  Laterality: Left;  . CARDIAC CATHETERIZATION  2002  . CARDIAC CATHETERIZATION  09/17/2017  . CATARACT EXTRACTION W/ INTRAOCULAR LENS   IMPLANT, BILATERAL Bilateral   . COLONOSCOPY W/ BIOPSIES AND POLYPECTOMY  2011  . CORONARY STENT INTERVENTION N/A 09/21/2017   Procedure: CORONARY STENT INTERVENTION;  Surgeon: Troy Sine, MD;  Location: [facility];  Service: Cardiovascular;  Laterality: N/A;  . DILATION AND CURETTAGE OF UTERUS    . EXCISIONAL HEMORRHOIDECTOMY  X 2  . GANGLION CYST EXCISION Left X 3  . INTRAVASCULAR PRESSURE WIRE/FFR STUDY N/A 09/17/2017   Procedure: INTRAVASCULAR PRESSURE WIRE/FFR STUDY;  Surgeon: Nelva Bush, MD;  Location: [facility];  Service: Cardiovascular;  Laterality: N/A;  . NASAL SINUS SURGERY    . PORTA CATH INSERTION Right 2013  . RIGHT/LEFT HEART CATH AND CORONARY ANGIOGRAPHY N/A 09/17/2017   Procedure: RIGHT/LEFT HEART CATH AND CORONARY ANGIOGRAPHY;  Surgeon: Nelva Bush, MD;  Location: [facility];  Service: Cardiovascular;  Laterality: N/A;  . TUBAL LIGATION      Current Medications: Current Meds  Medication Sig  . amLODipine (NORVASC) 10 MG tablet Take 1 tablet (10 mg total) by mouth daily.  Marland Kitchen aspirin EC 81 MG tablet Take 1 tablet (81 mg total) by mouth daily.  Marland Kitchen atorvastatin (LIPITOR) 40 MG tablet Take 1 tablet (40 mg total) by mouth daily.  . Blood Glucose Monitoring Suppl (TRUE METRIX GO GLUCOSE METER) w/Device KIT 30 Units by Does not apply route daily.  . Calcium Carbonate-Vit D-Min (CALTRATE 600+D PLUS MINERALS) 600-800 MG-UNIT TABS Take 1 tablet by mouth daily.  . clopidogrel (PLAVIX) 75 MG tablet Take 1 tablet (75 mg total) by mouth every evening.  . cyanocobalamin (,VITAMIN B-12,) 1000 MCG/ML injection ADMINISTER 1 ML(1000 MCG) IN THE MUSCLE every 30 days  . fluticasone furoate-vilanterol (BREO ELLIPTA) 200-25 MCG/INH AEPB Inhale 1 puff into the lungs daily.  Marland Kitchen glucose blood (TRUE METRIX BLOOD GLUCOSE TEST) test strip TEST BLOOD GLUCOSE THREE TIMES DAILY  . HYDROcodone-acetaminophen (NORCO) 5-325 MG tablet Take 1 tablet by mouth every 6 (six)  hours as needed for moderate pain.  Marland Kitchen insulin aspart (NOVOLOG) 100 UNIT/ML injection Inject 12 Units into the skin 3 (three) times daily before meals.  . insulin glargine, 2 Unit Dial, (TOUJEO MAX SOLOSTAR) 300 UNIT/ML Solostar Pen Inject 16 Units into the skin at bedtime.  . Insulin Pen Needle 32G X 4 MM MISC 1 Device by Does not apply route in the morning, at noon, in the evening, and at bedtime.  . isosorbide mononitrate (IMDUR) 30 MG 24 hr tablet TAKE 1 AND 1/2 TABLETS EVERY DAY  . letrozole (FEMARA) 2.5 MG tablet Take 1 tablet (2.5 mg total) by mouth daily.  Marland Kitchen LORazepam (ATIVAN) 0.5 MG tablet TAKE 1 TABLET BY MOUTH EVERY 8 HOURS AS NEEDED FOR ANXIETY(DO NOT EXCEED MORE THAN 2 TABS IN 24 HRS)  . MAGNESIUM OXIDE 400 PO Take 400 mg by mouth 2 (two)  times daily.  . metoprolol succinate (TOPROL-XL) 100 MG 24 hr tablet TAKE 1 TABLET EVERY DAY  WITH  OR  IMMEDIATELY FOLLOWING A MEAL  . nitroGLYCERIN (NITROSTAT) 0.4 MG SL tablet Place 1 tablet (0.4 mg total) under the tongue every 5 (five) minutes as needed for chest pain.  . polyethylene glycol (MIRALAX / GLYCOLAX) 17 g packet Take 17 g by mouth as needed.  . potassium chloride (KLOR-CON) 10 MEQ tablet Take 1 tablet (10 mEq total) by mouth daily.  . promethazine (PHENERGAN) 12.5 MG tablet Take 2 tablets (25 mg total) by mouth every 6 (six) hours as needed for nausea.  Marland Kitchen SYRINGE-NEEDLE, DISP, 3 ML (BD SAFETYGLIDE SYRINGE/NEEDLE) 25G X 1" 3 ML MISC Use for B12 injections  . [DISCONTINUED] HYDROcodone-acetaminophen (NORCO) 5-325 MG tablet Take 1 tablet by mouth every 6 (six) hours as needed for moderate pain.  . [DISCONTINUED] HYDROcodone-acetaminophen (NORCO) 5-325 MG tablet Take 1 tablet by mouth every 6 (six) hours as needed for moderate pain.  . [DISCONTINUED] torsemide (DEMADEX) 20 MG tablet Take 2 tablets by mouth daily.     Allergies:   Patient has no known allergies.   Social History   Socioeconomic History  . Marital status: Married     Spouse name: Not on file  . Number of children: Not on file  . Years of education: Not on file  . Highest education level: Not on file  Occupational History  . Occupation: works in a lab    Comment: makes glasses  Tobacco Use  . Smoking status: Former Smoker    Packs/day: 1.00    Years: 20.00    Pack years: 20.00    Types: Cigarettes    Quit date: 12/08/2000    Years since quitting: 19.4  . Smokeless tobacco: Never Used  Vaping Use  . Vaping Use: Never used  Substance and Sexual Activity  . Alcohol use: No    Alcohol/week: 0.0 standard drinks  . Drug use: No  . Sexual activity: Never  Other Topics Concern  . Not on file  Social History Narrative   Still works full-time assembling eye glasses _0    Social Determinants of Radio broadcast assistant Strain:   . Difficulty of Paying Living Expenses:   Food Insecurity:   . Worried About Charity fundraiser in the Last Year:   . Arboriculturist in the Last Year:   Transportation Needs: No Transportation Needs  . Lack of Transportation (Medical): No  . Lack of Transportation (Non-Medical): No  Physical Activity:   . Days of Exercise per Week:   . Minutes of Exercise per Session:   Stress:   . Feeling of Stress :   Social Connections:   . Frequency of Communication with Friends and Family:   . Frequency of Social Gatherings with Friends and Family:   . Attends Religious Services:   . Active Member of Clubs or Organizations:   . Attends Archivist Meetings:   Marland Kitchen Marital Status:      Family History: The patient's family history includes Breast cancer (age of onset: 58) in her sister; COPD in her father; Cancer in her maternal uncle, niece, paternal aunt, and paternal uncle; Cervical cancer in her sister; Cirrhosis in her maternal uncle; Congestive Heart Failure in her mother; Esophageal cancer in her brother and father; Heart attack in her maternal grandfather, maternal grandmother, and paternal grandfather;  Leukemia in her cousin; Lung cancer in her sister, sister, and sister;  Skin cancer in her daughter and maternal grandfather; Stroke (age of onset: 92) in her paternal grandmother; Suicidality in her brother. There is no history of Colon cancer.  ROS:   Please see the history of present illness.     All other systems reviewed and are negative.  EKGs/Labs/Other Studies Reviewed:    The following studies were reviewed today:   EKG:    Recent Labs: 05/08/2020: ALT 29; B Natriuretic Peptide 570.5; BUN 15; Creatinine, Ser 1.26; Hemoglobin 11.4; Platelets 124; Potassium 4.5; Sodium 142  Recent Lipid Panel    Component Value Date/Time   CHOL 130 02/17/2018 1005   TRIG 161 (H) 02/17/2018 1005   HDL 38 (L) 02/17/2018 1005   CHOLHDL 3.4 02/17/2018 1005   CHOLHDL 4.5 11/22/2017 0354   VLDL 44 (H) 11/22/2017 0354   LDLCALC 60 02/17/2018 1005    Physical Exam:     Physical Exam: Blood pressure (!) 124/56, pulse 72, height _0  (1.575 m), weight 163 lb 8 oz (74.2 kg), SpO2 96 %.  GEN: Elderly female, no acute distress HEENT: Normal NECK: No JVD; No carotid bruits LYMPHATICS: No lymphadenopathy CARDIAC: RRR , no murmurs, rubs, gallops RESPIRATORY:  Clear to auscultation without rales, wheezing or rhonchi  ABDOMEN: Soft, non-tender, non-distended MUSCULOSKELETAL:  No edema; No deformity  SKIN: Warm and dry NEUROLOGIC:  Alert and oriented x 3   ASSESSMENT:    No diagnosis found. PLAN:    In order of problems listed above:  1. Coronary artery disease: [name] presents for follow-up of her coronary artery disease.  She had a non-ST segment elevation myocardial infarction in September, 2018.  She has subsequent stenting of her right coronary artery in October, 2018.   .   2.  Essential hypertension:    3.  Chronic diastolic congestive heart failure: She had an episode of congestive heart failure and presented to the emergency room on June 1.  She was not admitted but was sent home with  congestive heart failure home health nurses.  She was treated with IV Lasix for several days and then her Lasix was changed to torsemide.  She seems to be doing quite a bit better.  She still has some mild leg edema but no pulmonary edema.  I advised her to continue to watch her salt intake.  I advised her to elevate her legs and to use compression hose.   Medication Adjustments/Labs and Tests Ordered: Current medicines are reviewed at length with the patient today.  Concerns regarding medicines are outlined above.  No orders of the defined types were placed in this encounter.  Meds ordered this encounter  Medications  . torsemide (DEMADEX) 20 MG tablet    Sig: Take 2 tablets (40 mg total) by mouth daily.    Dispense:  180 tablet    Refill:  3    Signed, Mertie Moores, MD  05/30/2020 8:50 AM    [facility]

## 2020-05-30 NOTE — Patient Instructions (Signed)
Medication Instructions:  Your physician recommends that you continue on your current medications as directed. Please refer to the Current Medication list given to you today.  *If you need a refill on your cardiac medications before your next appointment, please call your pharmacy*   Lab Work: None If you have labs (blood work) drawn today and your tests are completely normal, you will receive your results only by: Marland Kitchen MyChart Message (if you have MyChart) OR . A paper copy in the mail If you have any lab test that is abnormal or we need to change your treatment, we will call you to review the results.   Testing/Procedures: None   Follow-Up: At [facility], you and your health needs are our priority.  As part of our continuing mission to provide you with exceptional heart care, we have created designated Provider Care Teams.  These Care Teams include your primary Cardiologist (physician) and Advanced Practice Providers (APPs -  Physician Assistants and Nurse Practitioners) who all work together to provide you with the care you need, when you need it.  We recommend signing up for the patient portal called "MyChart".  Sign up information is provided on this After Visit Summary.  MyChart is used to connect with patients for Virtual Visits (Telemedicine).  Patients are able to view lab/test results, encounter notes, upcoming appointments, etc.  Non-urgent messages can be sent to your provider as well.   To learn more about what you can do with MyChart, go to [email or web address].    Your next appointment:   6 month(s)  The format for your next appointment:   In Person  Provider:   You may see Mertie Moores, MD or one of the following Advanced Practice Providers on your designated Care Team:    Richardson Dopp, PA-C  Robbie Lis, [place]    Other Instructions

## 2020-05-31 ENCOUNTER — Inpatient Hospital Stay: Payer: Medicare HMO

## 2020-05-31 NOTE — Chronic Care Management (AMB) (Deleted)
Chronic Care Management Pharmacy  Name: [name]  MRN: [id number] DOB: Oct 19, 1941  Chief Complaint/ HPI   [name],  79 y.o. , female presents for their {Initial/Follow-up:3041532} CCM visit with the clinical pharmacist {CHL HP Upstream Pharm visit ZJIR:[id number]}.  PCP : Isaac Bliss, Rayford Halsted, MD  Their chronic conditions include: {CHL AMB CHRONIC MEDICAL CONDITIONS:[phone]}  *** 5/19  Office Visits: 04/13/20 OV - A1c of 9.6%. Supposed to be on Toujeo 18 units at bedtime and regular insuling 12 units with each meal. Only injects 14 units of Toujeo and frequently skip her morning regular insulin. Toujeo increased to 20 units. F/u in 3 months.  Consult Visit: 05/30/20 OV (Nahser, Cardio) - CHF f/u. Patient doing better. On torsemide 20 mg 2 tablets daily. Still has some mild leg edema but no pulmonary edema.  05/08/20 - ED - Acute on chronic CHF. Not admitted but sent home with CHF home health nurses. Treated with IV lasix for several days and changed to torsemide.   04/06/20 OV Marcello Moores, Oncology) - Referred for bone density study. No changes with medications.  Medications: Outpatient Encounter Medications as of 06/05/2020  Medication Sig  . amLODipine (NORVASC) 10 MG tablet Take 1 tablet (10 mg total) by mouth daily.  Marland Kitchen aspirin EC 81 MG tablet Take 1 tablet (81 mg total) by mouth daily.  Marland Kitchen atorvastatin (LIPITOR) 40 MG tablet Take 1 tablet (40 mg total) by mouth daily.  . Blood Glucose Monitoring Suppl (TRUE METRIX GO GLUCOSE METER) w/Device KIT 30 Units by Does not apply route daily.  . Calcium Carbonate-Vit D-Min (CALTRATE 600+D PLUS MINERALS) 600-800 MG-UNIT TABS Take 1 tablet by mouth daily.  . clopidogrel (PLAVIX) 75 MG tablet Take 1 tablet (75 mg total) by mouth every evening.  . cyanocobalamin (,VITAMIN B-12,) 1000 MCG/ML injection ADMINISTER 1 ML(1000 MCG) IN THE MUSCLE every 30 days  . fluticasone furoate-vilanterol (BREO ELLIPTA) 200-25 MCG/INH AEPB Inhale 1  puff into the lungs daily.  Marland Kitchen glucose blood (TRUE METRIX BLOOD GLUCOSE TEST) test strip TEST BLOOD GLUCOSE THREE TIMES DAILY  . HYDROcodone-acetaminophen (NORCO) 5-325 MG tablet Take 1 tablet by mouth every 6 (six) hours as needed for moderate pain.  Marland Kitchen insulin aspart (NOVOLOG) 100 UNIT/ML injection Inject 12 Units into the skin 3 (three) times daily before meals.  . insulin glargine, 2 Unit Dial, (TOUJEO MAX SOLOSTAR) 300 UNIT/ML Solostar Pen Inject 16 Units into the skin at bedtime.  . Insulin Pen Needle 32G X 4 MM MISC 1 Device by Does not apply route in the morning, at noon, in the evening, and at bedtime.  . isosorbide mononitrate (IMDUR) 30 MG 24 hr tablet TAKE 1 AND 1/2 TABLETS EVERY DAY  . letrozole (FEMARA) 2.5 MG tablet Take 1 tablet (2.5 mg total) by mouth daily.  Marland Kitchen LORazepam (ATIVAN) 0.5 MG tablet TAKE 1 TABLET BY MOUTH EVERY 8 HOURS AS NEEDED FOR ANXIETY(DO NOT EXCEED MORE THAN 2 TABS IN 24 HRS)  . MAGNESIUM OXIDE 400 PO Take 400 mg by mouth 2 (two) times daily.  . metoprolol succinate (TOPROL-XL) 100 MG 24 hr tablet TAKE 1 TABLET EVERY DAY  WITH  OR  IMMEDIATELY FOLLOWING A MEAL  . nitroGLYCERIN (NITROSTAT) 0.4 MG SL tablet Place 1 tablet (0.4 mg total) under the tongue every 5 (five) minutes as needed for chest pain.  . polyethylene glycol (MIRALAX / GLYCOLAX) 17 g packet Take 17 g by mouth as needed.  . potassium chloride (KLOR-CON) 10 MEQ tablet Take  1 tablet (10 mEq total) by mouth daily.  . promethazine (PHENERGAN) 12.5 MG tablet Take 2 tablets (25 mg total) by mouth every 6 (six) hours as needed for nausea.  Marland Kitchen SYRINGE-NEEDLE, DISP, 3 ML (BD SAFETYGLIDE SYRINGE/NEEDLE) 25G X 1" 3 ML MISC Use for B12 injections  . torsemide (DEMADEX) 20 MG tablet Take 2 tablets (40 mg total) by mouth daily.   Facility-Administered Encounter Medications as of 06/05/2020  Medication  . sodium chloride 0.9 % 1,000 mL with magnesium sulfate 2 g infusion     Current Diagnosis/Assessment:  Goals  Addressed   None     Heart Failure   Type: Combined Systolic and Diastolic  Last ejection fraction: 55-60% (05/08/20) NYHA Class: III (marked limitation of activity) AHA HF Stage: D (Advanced disease requiring aggressive medical therapy)  Patient has failed these meds in past: benazapril, furosemide, lisinopril Patient is currently {CHL Controlled/[phone]} on the following medications:  . Amlodipine 10 mg 1 tablet once daily . Isosorbide mononitrate 30 mg 1.5 tablets daily . Metoprolol succinate 100 mg 1 tablet daily with or immediately following a meal . Torsemide 20 mg 2 tablets daily . Klor-con 10 meq 1 tablet daily   We discussed {CHL HP Upstream Pharmacy discussion:[phone]}  Plan  Continue {CHL HP Upstream Pharmacy [phone]}   Hypertension   Office blood pressures are  BP Readings from Last 3 Encounters:  05/30/20 (!) 124/56  05/08/20 (!) 145/73  05/02/20 (!) 146/68    Patient has failed these meds in the past: benazapril, furosemide, lisinopril Patient is currently {CHL Controlled/[phone]} on the following medications:  . Amlodipine 10 mg 1 tablet once daily . Isosorbide mononitrate 30 mg 1.5 tablets daily . Metoprolol succinate 100 mg 1 tablet daily with or immediately following a meal . Torsemide 20 mg 2 tablets daily . Nitroglycerin 0.4 mg 1 tablet under the tongue every 5 mins as needed for chest pain  Patient checks BP at home {CHL HP BP Monitoring Frequency:[phone]}  Patient home BP readings are ranging: ***  We discussed {CHL HP Upstream Pharmacy discussion:[phone]}  Plan  Continue {CHL HP Upstream Pharmacy [phone]}      Hyperlipidemia   Lipid Panel     Component Value Date/Time   CHOL 130 02/17/2018 1005   TRIG 161 (H) 02/17/2018 1005   HDL 38 (L) 02/17/2018 1005   CHOLHDL 3.4 02/17/2018 1005   CHOLHDL 4.5 11/22/2017 0354   VLDL 44 (H) 11/22/2017 0354   LDLCALC 60 02/17/2018 1005    LABVLDL 32 02/17/2018 1005     The ASCVD Risk score (Goff DC Jr., et al., 2013) failed to calculate for the following reasons:   The patient has a prior MI or stroke diagnosis   Patient has failed these meds in past: simvastatin Patient is currently {CHL Controlled/[phone]} on the following medications:  . Atorvastatin 40 mg 1 tablet daily  We discussed:  {CHL HP Upstream Pharmacy discussion:[phone]}  Plan  Continue {CHL HP Upstream Pharmacy WPYKD:[id number]}    Diabetes   Recent Relevant Labs: Lab Results  Component Value Date/Time   HGBA1C 9.6 (A) 04/13/2020 03:11 PM   HGBA1C 7.7 (A) 10/26/2019 03:30 PM   HGBA1C 9.3 (H) 03/29/2019 02:59 PM   HGBA1C 8.6 (H) 10/27/2018 05:31 PM   HGBA1C 9.8 10/23/2017 12:00 AM   HGBA1C 7.2 (H) 03/06/2017 12:36 PM   HGBA1C 6.6 08/25/2016 12:00 AM   GFR 31.31 (L) 07/14/2017 04:35 PM   GFR 47.11 (L) 09/03/2012 09:57 AM   MICROALBUR 43.5 (H)  10/27/2018 05:31 PM   MICROALBUR 75.4 10/05/2015 05:11 PM     Checking BG: {CHL HP Blood Glucose Monitoring Frequency:[phone]}  Recent FBG Readings: *** Recent pre-meal BG readings: *** Recent 2hr PP BG readings:  *** Recent HS BG readings: ***  Patient has failed these meds in past: byetta, glipizide, glimepiride, lantus, novolin R, metformin, pioglitazone Patient is currently {CHL Controlled/Uncontrolled:[phone]} on the following medications:  . Novolog 100 units/ml inject 12 units into the skin 3 times daily before meals . Toujeo 300 units/ml inject 16 units into the skin at bedtime  Last diabetic Eye exam:  Lab Results  Component Value Date/Time   HMDIABEYEEXA No Retinopathy 01/14/2017 11:58 AM    Last diabetic Foot exam: No results found for: HMDIABFOOTEX   We discussed: {CHL HP Upstream Pharmacy discussion:[phone]}  Plan  Continue {CHL HP Upstream Pharmacy Plans:[phone]}   CAD   Patient has failed these meds in past: *** Patient is currently {CHL  Controlled/Uncontrolled:[phone]} on the following medications:  . Clopidogrel 75 mg 1 tablet in the evening  We discussed:  ***  Plan  Continue {CHL HP Upstream Pharmacy Plans:[phone]}    Left breast cancer s/p left lumpectomy   Patient has failed these meds in past: none Patient is currently {CHL Controlled/Uncontrolled:[phone]} on the following medications:  . Letrozole 2.5 mg 1 tablet daily  We discussed:  ***  Plan  Continue {CHL HP Upstream Pharmacy OVFIE:[id number]}   Anxiety   Patient has failed these meds in past: none Patient is currently {CHL Controlled/Uncontrolled:[phone]} on the following medications:  . Lorazepam 0.5 mg 1 tablet every 8 hrs as needed for anxiety. Do not exceed 2 tablets in 24 hrs.  We discussed:  ***  Plan  Continue {CHL HP Upstream Pharmacy Plans:[phone]}    Vitamin B12 deficiency   Patient has failed these meds in past: none Patient is currently {CHL Controlled/Uncontrolled:[phone]} on the following medications:  Marland Kitchen Vitamin B12 1000 mcg/ml inject 1 ml into the muscle every 30 days  We discussed:  ***  Plan  Continue {CHL HP Upstream Pharmacy YSAYT:[id number]}   OTCs/Health Maintenance   Patient is currently {CHL Controlled/Uncontrolled:[phone]} on the following medications: Marland Kitchen Miralax packets take 17 g by mouth as needed . TrueMetrix meter and test strips to use 3 times daily . Pen needle 32G x 4 mm use 3 times daily . BD SafetyGlide 25G x 1" use for B12 injections . Magnesium Oxide 400 mg 1 tablet twice daily . Calcium+D 600/800 mg/unit 1 tablet daily . Aspirin EC 81 mg 1 tablet daily . ***Breo Ellipta 200-25 mcg/inh 1 puff into the lungs daily . Hydrocodone/APAP 5/325 mg 1 tablet every 6 hrs as needed for moderate pain . Promethazine 12.5 mg 2 tablets every 6 hrs as needed for nausea  We discussed:  ***  Plan  Continue {CHL HP Upstream Pharmacy FTDDU:[id number]}   Vaccines   Reviewed and  discussed patient's vaccination history.    Immunization History  Administered Date(s) Administered  . Fluad Quad(high Dose 65+) 09/16/2019  . H1N1 01/05/2009  . Influenza Split 09/03/2012  . Influenza Whole 08/26/2009, 08/20/2010  . Influenza, High Dose Seasonal PF 09/14/2013, 09/15/2014, 08/25/2017, 09/21/2018, 09/16/2019  . Influenza,inj,Quad PF,6+ Mos 08/27/2015, 08/25/2016  . Pneumococcal Conjugate-13 10/31/2013  . Pneumococcal Polysaccharide-23 11/18/2016  . Td 10/10/2010    Plan  Recommended patient receive *** vaccine in *** office.    Medication Management   Pharmacy/Benefits: [facility] / [facility] / [employer] Adherence:  Pt endorses ***% compliance  We discussed: ***  Plan  {US Pharmacy ZSMO:[id number]}   Follow up: *** month phone visit  Geraldine Contras, PharmD Clinical Pharmacist [facility] [place] [phone]

## 2020-06-05 ENCOUNTER — Telehealth: Payer: Medicare HMO

## 2020-06-05 NOTE — Addendum Note (Signed)
Addended by: Westley Hummer B on: 06/05/2020 08:55 AM   Modules accepted: Orders

## 2020-06-08 ENCOUNTER — Ambulatory Visit
Admission: RE | Admit: 2020-06-08 | Discharge: 2020-06-08 | Disposition: A | Discharge status: Home / Self Care | Payer: Medicare HMO | Source: Ambulatory Visit | Attending: Oncology | Admitting: Oncology

## 2020-06-08 DIAGNOSIS — C50919 Malignant neoplasm of unspecified site of unspecified female breast: Secondary | ICD-10-CM | POA: Diagnosis not present

## 2020-06-08 DIAGNOSIS — C50412 Malignant neoplasm of upper-outer quadrant of left female breast: Secondary | ICD-10-CM

## 2020-06-08 DIAGNOSIS — M47814 Spondylosis without myelopathy or radiculopathy, thoracic region: Secondary | ICD-10-CM | POA: Diagnosis not present

## 2020-06-08 DIAGNOSIS — M4804 Spinal stenosis, thoracic region: Secondary | ICD-10-CM | POA: Diagnosis not present

## 2020-06-08 MED ORDER — GADOBENATE DIMEGLUMINE 529 MG/ML IV SOLN
15.0000 mL | Freq: Once | INTRAVENOUS | Status: AC | PRN
  Administered 2020-06-08: 15 mL via INTRAVENOUS

## 2020-06-21 ENCOUNTER — Other Ambulatory Visit: Payer: Medicare HMO

## 2020-06-27 ENCOUNTER — Other Ambulatory Visit: Payer: Self-pay | Admitting: Internal Medicine

## 2020-06-27 DIAGNOSIS — I10 Essential (primary) hypertension: Secondary | ICD-10-CM

## 2020-06-27 DIAGNOSIS — I2510 Atherosclerotic heart disease of native coronary artery without angina pectoris: Secondary | ICD-10-CM

## 2020-07-18 ENCOUNTER — Encounter: Payer: Self-pay | Admitting: Internal Medicine

## 2020-07-18 ENCOUNTER — Ambulatory Visit (INDEPENDENT_AMBULATORY_CARE_PROVIDER_SITE_OTHER): Payer: Medicare HMO | Admitting: Internal Medicine

## 2020-07-18 ENCOUNTER — Other Ambulatory Visit: Payer: Self-pay

## 2020-07-18 VITALS — BP 150/60 | HR 73 | Temp 98.2°F | Ht 62.0 in | Wt 157.1 lb

## 2020-07-18 DIAGNOSIS — E1165 Type 2 diabetes mellitus with hyperglycemia: Secondary | ICD-10-CM

## 2020-07-18 DIAGNOSIS — Z794 Long term (current) use of insulin: Secondary | ICD-10-CM | POA: Diagnosis not present

## 2020-07-18 DIAGNOSIS — I10 Essential (primary) hypertension: Secondary | ICD-10-CM

## 2020-07-18 DIAGNOSIS — I5033 Acute on chronic diastolic (congestive) heart failure: Secondary | ICD-10-CM

## 2020-07-18 LAB — POCT GLYCOSYLATED HEMOGLOBIN (HGB A1C): Hemoglobin A1C: 8.3 % — AB (ref 4.0–5.6)

## 2020-07-18 MED ORDER — AMLODIPINE BESYLATE 10 MG PO TABS: 10.0000 mg | ORAL_TABLET | Freq: Every day | ORAL | 1 refills | Status: DC

## 2020-07-18 NOTE — Progress Notes (Signed)
Established Patient Office Visit     This visit occurred during the SARS-CoV-2 public health emergency.  Safety protocols were in place, including screening questions prior to the visit, additional usage of staff PPE, and extensive cleaning of exam room while observing appropriate contact time as indicated for disinfecting solutions.    CC/Reason for Visit: 28-monthfollow-up chronic medical conditions  HPI: MZIOMARA BIRENBAUMis a 79y.o. female who is coming in today for the above mentioned reasons.  She sees me mainly for follow-up on her diabetes and hypertension.  At last visit her A1c was uncontrolled at 9.6.  Adjustments were made to her insulin regimen.  She takes long-acting Toujeo as well as NovoLog 12 units with meals.  Blood pressure is above goal today at 150/60 but was normal at her most recent visit with cardiology.  Since I last saw her, she did visit the emergency department due to shortness of breath and lower extremity edema.  She was diagnosed with acute on chronic diastolic CHF, she was not admitted but sent home with the CHF home health program.  They adjusted her diuretic therapy, she has since followed up with cardiology and is improving.  She is weighing herself on a daily basis.  She is down 7 pounds from her last office visit.  She does not bring in blood pressure or CBG logs today.   Past Medical/Surgical History: Past Medical History:  Diagnosis Date  . acute myeloblastic leukemia (AML) dx'd 06/2014  . [place]ogenous [place] ([facility] 02/2014  . Acute pancreatitis   . Arthritis    "back" (09/17/2017)  . [place]rial flutter ([facility]   . Cardiomyopathy ([facility]   . Chronic kidney disease, stage II (mild)   . Chronic lower back pain   . Colon polyps 04/29/2010   TUBULAR ADENOMA AND A SERRATED ADENOMA  . COPD ([place] obstructive pulmonary disease) ([place] [place]   . CORONARY ARTERY DISEASE 12/24/2007  . DIABETES MELLITUS, TYPE II 07/13/2007  . Family history of cervical cancer   .  Family history of esophageal cancer   . Family history of leukemia   . Family history of lung cancer   . Family history of skin cancer   . History of blood transfusion 2015   "related to leukemia"  . History of kidney stones 12/24/2007  . History of uterine cancer   . HYPERLIPIDEMIA 12/24/2007  . HYPERTENSION 07/13/2007  . NSTEMI (non-ST elevated myocardial infarction) ([place] 09/17/2017  . Unspecified disease of pancreas   . Uterine cancer ([facility]     Past Surgical History:  Procedure Laterality Date  . ABDOMINAL HYSTERECTOMY     "still have my ovaries"  . BREAST LUMPECTOMY WITH RADIOACTIVE SEED LOCALIZATION Left 10/24/2019   Procedure: LEFT BREAST LUMPECTOMY WITH RADIOACTIVE SEED LOCALIZATION;  Surgeon: TJovita Kussmaul MD;  [facility]  Service: General;  Laterality: Left;  . CARDIAC CATHETERIZATION  2002  . CARDIAC CATHETERIZATION  09/17/2017  . CATARACT EXTRACTION W/ INTRAOCULAR LENS  IMPLANT, BILATERAL Bilateral   . COLONOSCOPY W/ BIOPSIES AND POLYPECTOMY  2011  . CORONARY STENT INTERVENTION N/A 09/21/2017   Procedure: CORONARY STENT INTERVENTION;  Surgeon: KTroy Sine MD;  [facility];  Service: Cardiovascular;  Laterality: N/A;  . DILATION AND CURETTAGE OF UTERUS    . EXCISIONAL HEMORRHOIDECTOMY  X 2  . GANGLION CYST EXCISION Left X 3  . INTRAVASCULAR PRESSURE WIRE/FFR STUDY N/A 09/17/2017   Procedure: INTRAVASCULAR PRESSURE WIRE/FFR STUDY;  Surgeon: End, Christopher, MD;  [facility];  Service: Cardiovascular;  Laterality: N/A;  . NASAL SINUS SURGERY    . PORTA CATH INSERTION Right 2013  . RIGHT/LEFT HEART CATH AND CORONARY ANGIOGRAPHY N/A 09/17/2017   Procedure: RIGHT/LEFT HEART CATH AND CORONARY ANGIOGRAPHY;  Surgeon: End, Christopher, MD;  Location: [facility];  Service: Cardiovascular;  Laterality: N/A;  . TUBAL LIGATION      Social History:  reports that she quit smoking about 19 years ago. Her  smoking use included cigarettes. She has a 20.00 pack-year smoking history. She has never used smokeless tobacco. She reports that she does not drink alcohol and does not use drugs.  Allergies: No Known Allergies  Family History:  Family History  Problem Relation Age of Onset  . COPD Father   . Esophageal cancer Father   . Breast cancer Sister 50  . Lung cancer Sister   . Esophageal cancer Brother   . Suicidality Brother   . Lung cancer Sister   . Lung cancer Sister   . Cervical cancer Sister   . Congestive Heart Failure Mother   . Heart attack Maternal Grandmother   . Skin cancer Maternal Grandfather   . Heart attack Maternal Grandfather   . Stroke Paternal Grandmother 58  . Heart attack Paternal Grandfather   . Cancer Maternal Uncle        unknown type, dx. >65  . Cancer Paternal Aunt        unknown type, dx. >50  . Cancer Paternal Uncle        unknown type, dx. >50  . Cirrhosis Maternal Uncle   . Skin cancer Daughter   . Leukemia Cousin        maternal first cousin; dx. >50, in remission  . Cancer Niece        unknown type behind her ear, dx. 30s/40s  . Colon cancer Neg Hx      Current Outpatient Medications:  .  amLODipine (NORVASC) 10 MG tablet, Take 1 tablet (10 mg total) by mouth daily., Disp: 90 tablet, Rfl: 1 .  aspirin EC 81 MG tablet, Take 1 tablet (81 mg total) by mouth daily., Disp: , Rfl:  .  atorvastatin (LIPITOR) 40 MG tablet, Take 1 tablet (40 mg total) by mouth daily., Disp: 90 tablet, Rfl: 1 .  Blood Glucose Monitoring Suppl (TRUE METRIX GO GLUCOSE METER) w/Device KIT, 30 Units by Does not apply route daily., Disp: 1 kit, Rfl: 0 .  Calcium Carbonate-Vit D-Min (CALTRATE 600+D PLUS MINERALS) 600-800 MG-UNIT TABS, Take 1 tablet by mouth daily., Disp: , Rfl:  .  clopidogrel (PLAVIX) 75 MG tablet, TAKE 1 TABLET (75 MG TOTAL) BY MOUTH EVERY EVENING., Disp: 90 tablet, Rfl: 1 .  cyanocobalamin (,VITAMIN B-12,) 1000 MCG/ML injection, ADMINISTER 1 ML(1000 MCG)  IN THE MUSCLE every 30 days, Disp: 10 mL, Rfl: 10 .  fluticasone furoate-vilanterol (BREO ELLIPTA) 200-25 MCG/INH AEPB, Inhale 1 puff into the lungs daily., Disp: 120 each, Rfl: 5 .  glucose blood (TRUE METRIX BLOOD GLUCOSE TEST) test strip, TEST BLOOD GLUCOSE THREE TIMES DAILY, Disp: 300 strip, Rfl: 2 .  HYDROcodone-acetaminophen (NORCO) 5-325 MG tablet, Take 1 tablet by mouth every 6 (six) hours as needed for moderate pain., Disp: 120 tablet, Rfl: 0 .  insulin aspart (NOVOLOG) 100 UNIT/ML injection, Inject 12 Units into the skin 3 (three) times daily before meals., Disp: 15 mL, Rfl: 9 .  insulin glargine, 2 Unit Dial, (TOUJEO MAX SOLOSTAR)   300 UNIT/ML Solostar Pen, Inject 16 Units into the skin at bedtime., Disp: 15 mL, Rfl: 11 .  Insulin Pen Needle 32G X 4 MM MISC, 1 Device by Does not apply route in the morning, at noon, in the evening, and at bedtime., Disp: 400 each, Rfl: 3 .  isosorbide mononitrate (IMDUR) 30 MG 24 hr tablet, TAKE 1 AND 1/2 TABLETS EVERY DAY, Disp: 135 tablet, Rfl: 1 .  letrozole (FEMARA) 2.5 MG tablet, Take 1 tablet (2.5 mg total) by mouth daily., Disp: 90 tablet, Rfl: 3 .  LORazepam (ATIVAN) 0.5 MG tablet, TAKE 1 TABLET BY MOUTH EVERY 8 HOURS AS NEEDED FOR ANXIETY(DO NOT EXCEED MORE THAN 2 TABS IN 24 HRS), Disp: 60 tablet, Rfl: 2 .  MAGNESIUM OXIDE 400 PO, Take 400 mg by mouth 2 (two) times daily., Disp: , Rfl:  .  metoprolol succinate (TOPROL-XL) 100 MG 24 hr tablet, TAKE 1 TABLET EVERY DAY  WITH  OR  IMMEDIATELY FOLLOWING A MEAL, Disp: 90 tablet, Rfl: 1 .  nitroGLYCERIN (NITROSTAT) 0.4 MG SL tablet, Place 1 tablet (0.4 mg total) under the tongue every 5 (five) minutes as needed for chest pain., Disp: 24 tablet, Rfl: 1 .  polyethylene glycol (MIRALAX / GLYCOLAX) 17 g packet, Take 17 g by mouth as needed., Disp: , Rfl:  .  potassium chloride (KLOR-CON) 10 MEQ tablet, Take 1 tablet (10 mEq total) by mouth daily., Disp: 90 tablet, Rfl: 1 .  potassium chloride (KLOR-CON) 10 MEQ  tablet, TAKE 1 TABLET (10 MEQ TOTAL) BY MOUTH DAILY., Disp: 90 tablet, Rfl: 1 .  promethazine (PHENERGAN) 12.5 MG tablet, Take 2 tablets (25 mg total) by mouth every 6 (six) hours as needed for nausea., Disp: 12 tablet, Rfl: 0 .  SYRINGE-NEEDLE, DISP, 3 ML (BD SAFETYGLIDE SYRINGE/NEEDLE) 25G X 1" 3 ML MISC, Use for B12 injections, Disp: 100 each, Rfl: 11 .  torsemide (DEMADEX) 20 MG tablet, Take 2 tablets (40 mg total) by mouth daily., Disp: 180 tablet, Rfl: 3 No current facility-administered medications for this visit.  Facility-Administered Medications Ordered in Other Visits:  .  sodium chloride 0.9 % 1,000 mL with magnesium sulfate 2 g infusion, , Intravenous, Continuous, Sherrill, Gary B, MD, Stopped at 08/04/14 1441  Review of Systems:  Constitutional: Denies fever, chills, diaphoresis, appetite change and fatigue.  HEENT: Denies photophobia, eye pain, redness, hearing loss, ear pain, congestion, sore throat, rhinorrhea, sneezing, mouth sores, trouble swallowing, neck pain, neck stiffness and tinnitus.   Respiratory: Denies SOB, DOE, cough, chest tightness,  and wheezing.   Cardiovascular: Denies chest pain, palpitations and leg swelling.  Gastrointestinal: Denies nausea, vomiting, abdominal pain, diarrhea, constipation, blood in stool and abdominal distention.  Genitourinary: Denies dysuria, urgency, frequency, hematuria, flank pain and difficulty urinating.  Endocrine: Denies: hot or cold intolerance, sweats, changes in hair or nails, polyuria, polydipsia. Musculoskeletal: Denies myalgias, back pain, joint swelling, arthralgias and gait problem.  Skin: Denies pallor, rash and wound.  Neurological: Denies dizziness, seizures, syncope, weakness, light-headedness, numbness and headaches.  Hematological: Denies adenopathy. Easy bruising, personal or family bleeding history  Psychiatric/Behavioral: Denies suicidal ideation, mood changes, confusion, nervousness, sleep disturbance and  agitation    Physical Exam: Vitals:   07/18/20 1519  BP: (!) 150/60  Pulse: 73  Temp: 98.2 F (36.8 C)  TempSrc: Oral  SpO2: 96%  Weight: 157 lb 1.6 oz (71.3 kg)  Height: 5' 2" (1.575 m)    Body mass index is 28.73 kg/m.   Constitutional: NAD, calm, comfortable   Eyes: PERRL, lids and conjunctivae normal ENMT: Mucous membranes are moist.  Respiratory: clear to auscultation bilaterally, no wheezing, no crackles. Normal respiratory effort. No accessory muscle use.  Cardiovascular: Regular rate and rhythm, no murmurs / rubs / gallops. No extremity edema.  Neurologic: Grossly intact and nonfocal Psychiatric: Normal judgment and insight. Alert and oriented x 3. Normal mood.    Impression and Plan:  Type 2 diabetes mellitus with hyperglycemia, with long-term current use of insulin (HCC) -A1c, although improved, remains above goal at 8.3 today. -Have asked her to do a CBG log and return to see me in 1 month so we can do insulin titration. -She does long-acting insulin at bedtime plus short acting insulin with meals.  Essential hypertension  -Above target today, but in June was at home. -She will do ambulatory blood pressure monitoring and return in 4 weeks for follow-up.  Acute on chronic diastolic CHF (congestive heart failure) (HCC) -Congestive heart failure is compensated at this time, she is followed by cardiology.     Hernandez Acosta, MD  [facility] at [facility]   

## 2020-07-24 ENCOUNTER — Other Ambulatory Visit: Payer: Self-pay

## 2020-07-24 ENCOUNTER — Inpatient Hospital Stay: Payer: Medicare HMO

## 2020-07-24 ENCOUNTER — Inpatient Hospital Stay: Payer: Medicare HMO | Attending: Oncology | Admitting: Oncology

## 2020-07-24 VITALS — BP 183/58 | HR 71 | Temp 97.7°F | Resp 16 | Ht 62.0 in | Wt 158.4 lb

## 2020-07-24 DIAGNOSIS — I4891 Unspecified atrial fibrillation: Secondary | ICD-10-CM | POA: Diagnosis not present

## 2020-07-24 DIAGNOSIS — C9201 Acute myeloblastic leukemia, in remission: Secondary | ICD-10-CM

## 2020-07-24 DIAGNOSIS — C50412 Malignant neoplasm of upper-outer quadrant of left female breast: Secondary | ICD-10-CM

## 2020-07-24 DIAGNOSIS — Z7951 Long term (current) use of inhaled steroids: Secondary | ICD-10-CM | POA: Diagnosis not present

## 2020-07-24 DIAGNOSIS — Z170 Estrogen receptor positive status [ER+]: Secondary | ICD-10-CM

## 2020-07-24 DIAGNOSIS — N2889 Other specified disorders of kidney and ureter: Secondary | ICD-10-CM | POA: Insufficient documentation

## 2020-07-24 DIAGNOSIS — E119 Type 2 diabetes mellitus without complications: Secondary | ICD-10-CM | POA: Diagnosis not present

## 2020-07-24 DIAGNOSIS — D320 Benign neoplasm of cerebral meninges: Secondary | ICD-10-CM | POA: Diagnosis not present

## 2020-07-24 DIAGNOSIS — I2510 Atherosclerotic heart disease of native coronary artery without angina pectoris: Secondary | ICD-10-CM | POA: Insufficient documentation

## 2020-07-24 DIAGNOSIS — Z95828 Presence of other vascular implants and grafts: Secondary | ICD-10-CM

## 2020-07-24 MED ORDER — HEPARIN SOD (PORK) LOCK FLUSH 100 UNIT/ML IV SOLN
500.0000 [IU] | Freq: Once | INTRAVENOUS | Status: AC
  Administered 2020-07-24: 500 [IU] via INTRAVENOUS
  Filled 2020-07-24: qty 5

## 2020-07-24 MED ORDER — HEPARIN SOD (PORK) LOCK FLUSH 100 UNIT/ML IV SOLN
500.0000 [IU] | Freq: Once | INTRAVENOUS | Status: DC | PRN
  Filled 2020-07-24: qty 5

## 2020-07-24 MED ORDER — SODIUM CHLORIDE 0.9% FLUSH
10.0000 mL | INTRAVENOUS | Status: DC | PRN
  Administered 2020-07-24: 10 mL via INTRAVENOUS
  Filled 2020-07-24: qty 10

## 2020-07-24 MED ORDER — SODIUM CHLORIDE 0.9 % IJ SOLN
10.0000 mL | INTRAMUSCULAR | Status: DC | PRN
  Filled 2020-07-24: qty 10

## 2020-07-24 NOTE — Progress Notes (Signed)
[facility] OFFICE PROGRESS NOTE   Diagnosis: AML, breast cancer  INTERVAL HISTORY:   [name] returns as scheduled.  She continues letrozole.  She is tolerating the letrozole without apparent side effects.  She has occasional soreness in the left breast.  She has chronic low back/hip pain.  No significant pain in the upper back. She was seen in the emergency room 05/08/2020 with shortness of breath.  A CT chest was negative for pulmonary embolism.  Bilateral pleural effusions were noted.  Patchy sclerosis was noted throughout the thoracic spine and sternum felt to be consistent with widespread osseous metastases.  I contacted [name] with the CT results.  She did not report symptoms to suggest metastatic breast cancer.  I reviewed the CT images and radiology and it was felt the CT changes were potentially related to her history of AML.  A thoracic MRI was recommended. A thoracic MRI on 06/08/2020 revealed enhancement at T9 compatible with a bone metastasis, and other areas the bone marrow signal has improved compared to a two thousand fifteen MRI.  Further review of the MRI by radiology is suggestive of a marrow replacement process that was treated in two thousand fifteen with the only current suspicious area being the T9 body.  A surveillance thoracic MRI to follow-up on the T9 lesion is recommended.  [name] reports a good appetite.  No complaint today.   Objective:  Vital signs in last 24 hours:  Blood pressure (!) 183/58, pulse 71, temperature 97.7 F (36.5 C), temperature source Tympanic, resp. rate 16, height _0  (1.575 m), weight 158 lb 6.4 oz (71.8 kg), SpO2 100 %.    Lymphatics: No cervical, supraclavicular, or axillary nodes Resp: Lungs clear bilaterally Cardio: Regular rate and rhythm GI: No hepatosplenomegaly Vascular: No leg edema Breast: Bilateral breasts without mass, status post left lumpectomy.  No evidence for local tumor  recurrence Musculoskeletal: No spine tenderness  Portacath/PICC-without erythema  Lab Results:  Lab Results  Component Value Date   WBC 5.9 05/08/2020   HGB 11.4 (L) 05/08/2020   HCT 35.0 (L) 05/08/2020   MCV 97.8 05/08/2020   PLT 124 (L) 05/08/2020   NEUTROABS 2.7 04/06/2020    CMP  Lab Results  Component Value Date   NA 142 05/08/2020   K 4.5 05/08/2020   CL 109 05/08/2020   CO2 25 05/08/2020   GLUCOSE 196 (H) 05/08/2020   BUN 15 05/08/2020   CREATININE 1.26 (H) 05/08/2020   CALCIUM 8.5 (L) 05/08/2020   PROT 6.0 (L) 05/08/2020   ALBUMIN 3.2 (L) 05/08/2020   AST 27 05/08/2020   ALT 29 05/08/2020   ALKPHOS 106 05/08/2020   BILITOT 0.9 05/08/2020   GFRNONAA 41 (L) 05/08/2020   GFRAA 47 (L) 05/08/2020    No results found for: CEA1   Medications: I have reviewed the patient's current medications.   Assessment/Plan: 1. 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  Status post cycle 3 high-dose araC consolidation 08/22/2014.  Bone marrow 09/15/2014 showed necrotic marrow with minute focus of myeloid precursors. No evidence of viable acute leukemia. 2.History of pancytopenia secondary to chemotherapy.  3. C. difficile colitis-06/09/2014 treated with Flagyl  4. Diabetes  5. renal insufficiency  6. history of hypokalemia and hypomagnesemia. 7. history of atrial fibrillation  9. history of coronary artery disease  10. Hospitalization with febrile neutropenia/sepsis secondary to Escherichia coli bacteremia 09/02/2014. 11. Shingles involving the right back October 2015. 12. Meningioma on brain CT 05/24/2014 and brain  MRI 05/24/2014 13. Hospitalization09/17/2018 through 08/28/2017 with acute CHF. 14.Admission 11/21/2017 with expressive aphasia, seizure activity noted on EEG, placed on Keppra 15.  Left breast cancer, T1NX, status post a left lumpectomy 1116 2020-1.5 cm, grade 2, associated with high-grade DCIS with necrosis, ER 95%, PR 95%, HER-2 negative, KI-6710%  Letrozole 11/02/2019 16.  ER 05/08/2020-dyspnea  CT chest 05/08/2020-negative for pulmonary embolism, new patchy sclerosis throughout the thoracic skeleton concerning for bone metastases  MRI thoracic spine 06/08/2020-consistent with successfully treated marrow replacement process in 2015, current area of concern at T9 could represent a benign vascular lesion   Disposition: [name] is in clinical remission from AML and breast cancer.  She continues letrozole.  I suspect the bone findings on the June chest CT and July thoracic MRI are related to a benign process.  I have a low clinical suspicion for metastatic breast cancer or another metastatic tumor responsible for the atypical lesion at T9.  [name] will contact us if she develops upper back pain or new symptoms.  She would like to keep the Port-A-Cath in place.  She will return for Port-A-Cath flush in 8 weeks and an office visit in 16 weeks.  We will discuss the indication for a surveillance thoracic MRI when she is here in 16 weeks. I encouraged [name] to obtain the COVID-19 booster vaccine. Betsy Coder, MD  07/24/2020  4:05 PM

## 2020-07-26 ENCOUNTER — Encounter: Payer: Self-pay | Admitting: *Deleted

## 2020-07-26 NOTE — Progress Notes (Signed)
Lab tube drawn via port on 07/25/20 was not able to be located by lab. Per Dr. [name]: No need to repeat until next appointment on 09/19/20

## 2020-08-06 ENCOUNTER — Telehealth: Payer: Medicare HMO

## 2020-08-14 ENCOUNTER — Telehealth: Payer: Self-pay | Admitting: Internal Medicine

## 2020-08-14 NOTE — Progress Notes (Signed)
°  Chronic Care Management   Note  08/14/2020 Name: [name] MRN: [id number] DOB: 03-25-1941  [name] is a 79 y.o. year old female who is a primary care patient of Isaac Bliss, Rayford Halsted, MD. I reached out to [name] by phone today in response to a referral sent by Ms. [name] Arutyunyan's PCP, Isaac Bliss, Rayford Halsted, MD.   Ms. Saine was given information about Chronic Care Management services today including:  1. CCM service includes personalized support from designated clinical staff supervised by her physician, including individualized plan of care and coordination with other care providers 2. 24/7 contact phone numbers for assistance for urgent and routine care needs. 3. Service will only be billed when office clinical staff spend 20 minutes or more in a month to coordinate care. 4. Only one practitioner may furnish and bill the service in a calendar month. 5. The patient may stop CCM services at any time (effective at the end of the month) by phone call to the office staff.   Patient agreed to services and verbal consent obtained.   Follow up plan:   Carley Perdue [employer] Scheduler

## 2020-08-15 ENCOUNTER — Telehealth: Payer: Medicare HMO

## 2020-08-23 ENCOUNTER — Other Ambulatory Visit: Payer: Self-pay

## 2020-08-23 ENCOUNTER — Encounter: Payer: Self-pay | Admitting: Internal Medicine

## 2020-08-23 ENCOUNTER — Ambulatory Visit (INDEPENDENT_AMBULATORY_CARE_PROVIDER_SITE_OTHER): Payer: Medicare HMO | Admitting: Internal Medicine

## 2020-08-23 VITALS — BP 130/58 | HR 69 | Temp 98.3°F | Wt 162.5 lb

## 2020-08-23 DIAGNOSIS — Z23 Encounter for immunization: Secondary | ICD-10-CM

## 2020-08-23 DIAGNOSIS — E1165 Type 2 diabetes mellitus with hyperglycemia: Secondary | ICD-10-CM

## 2020-08-23 DIAGNOSIS — I5033 Acute on chronic diastolic (congestive) heart failure: Secondary | ICD-10-CM

## 2020-08-23 DIAGNOSIS — Z794 Long term (current) use of insulin: Secondary | ICD-10-CM

## 2020-08-23 DIAGNOSIS — I10 Essential (primary) hypertension: Secondary | ICD-10-CM

## 2020-08-23 MED ORDER — INSULIN ASPART 100 UNIT/ML ~~LOC~~ SOLN
14.0000 [IU] | Freq: Three times a day (TID) | SUBCUTANEOUS | 9 refills | Status: DC
Start: 2020-08-23 — End: 2021-04-10

## 2020-08-23 NOTE — Progress Notes (Signed)
Established Patient Office Visit     This visit occurred during the SARS-CoV-2 public health emergency.  Safety protocols were in place, including screening questions prior to the visit, additional usage of staff PPE, and extensive cleaning of exam room while observing appropriate contact time as indicated for disinfecting solutions.    CC/Reason for Visit: Follow-up hypertension and diabetes  HPI: [name] is a 79 y.o. female who is coming in today for the above mentioned reasons.  I see her mainly for management of hypertension and diabetes.  We have been in the process of titrating her insulin due to elevated A1c's.  It was 8.3 at last visit in August.  She is currently taking Toujeo 16 units at bedtime and NovoLog 12 units with meals.  She ends up taking the NovoLog on average twice a day as she tends to skip breakfast.  She does not bring in her CBG log, but tells me that her fasting sugars are usually in the 90-120 range and throughout the day her sugars can be as high as 300.  Blood pressures have been well controlled.  She has been having issues again with lower extremity edema and some very mild shortness of breath with exertion.  She was treated recently with increased torsemide dosing for acute on chronic diastolic heart failure.   Past Medical/Surgical History: Past Medical History:  Diagnosis Date  . acute myeloblastic leukemia (AML) dx'd 06/2014  . Acute myelogenous leukemia ([place]) 02/2014  . Acute pancreatitis   . Arthritis    "back" (09/17/2017)  . Atrial flutter ([place])   . Cardiomyopathy ([place])   . Chronic kidney disease, stage II (mild)   . Chronic lower back pain   . Colon polyps 04/29/2010   TUBULAR ADENOMA AND A SERRATED ADENOMA  . COPD (chronic obstructive pulmonary disease) ([place])   . CORONARY ARTERY DISEASE 12/24/2007  . DIABETES MELLITUS, TYPE II 07/13/2007  . Family history of cervical cancer   . Family history of esophageal cancer   . Family history of  leukemia   . Family history of lung cancer   . Family history of skin cancer   . History of blood transfusion 2015   "related to leukemia"  . History of kidney stones 12/24/2007  . History of uterine cancer   . HYPERLIPIDEMIA 12/24/2007  . HYPERTENSION 07/13/2007  . NSTEMI (non-ST elevated myocardial infarction) ([place]) 09/17/2017  . Unspecified disease of pancreas   . Uterine cancer [facility])     Past Surgical History:  Procedure Laterality Date  . ABDOMINAL HYSTERECTOMY     "still have my ovaries"  . BREAST LUMPECTOMY WITH RADIOACTIVE SEED LOCALIZATION Left 10/24/2019   Procedure: LEFT BREAST LUMPECTOMY WITH RADIOACTIVE SEED LOCALIZATION;  Surgeon: Jovita Kussmaul, MD;  Location: [facility];  Service: General;  Laterality: Left;  . CARDIAC CATHETERIZATION  2002  . CARDIAC CATHETERIZATION  09/17/2017  . CATARACT EXTRACTION W/ INTRAOCULAR LENS  IMPLANT, BILATERAL Bilateral   . COLONOSCOPY W/ BIOPSIES AND POLYPECTOMY  2011  . CORONARY STENT INTERVENTION N/A 09/21/2017   Procedure: CORONARY STENT INTERVENTION;  Surgeon: Troy Sine, MD;  Location: [facility] CV LAB;  Service: Cardiovascular;  Laterality: N/A;  . DILATION AND CURETTAGE OF UTERUS    . EXCISIONAL HEMORRHOIDECTOMY  X 2  . GANGLION CYST EXCISION Left X 3  . INTRAVASCULAR PRESSURE WIRE/FFR STUDY N/A 09/17/2017   Procedure: INTRAVASCULAR PRESSURE WIRE/FFR STUDY;  Surgeon: Nelva Bush, MD;  Location: [facility]  LAB;  Service: Cardiovascular;  Laterality: N/A;  . NASAL SINUS SURGERY    . PORTA CATH INSERTION Right 2013  . RIGHT/LEFT HEART CATH AND CORONARY ANGIOGRAPHY N/A 09/17/2017   Procedure: RIGHT/LEFT HEART CATH AND CORONARY ANGIOGRAPHY;  Surgeon: Nelva Bush, MD;  Location: [facility] LAB;  Service: Cardiovascular;  Laterality: N/A;  . TUBAL LIGATION      Social History:  reports that she quit smoking about 19 years ago. Her smoking use included cigarettes. She has a 20.00 pack-year  smoking history. She has never used smokeless tobacco. She reports that she does not drink alcohol and does not use drugs.  Allergies: No Known Allergies  Family History:  Family History  Problem Relation Age of Onset  . COPD Father   . Esophageal cancer Father   . Breast cancer Sister 51  . Lung cancer Sister   . Esophageal cancer Brother   . Suicidality Brother   . Lung cancer Sister   . Lung cancer Sister   . Cervical cancer Sister   . Congestive Heart Failure Mother   . Heart attack Maternal Grandmother   . Skin cancer Maternal Grandfather   . Heart attack Maternal Grandfather   . Stroke Paternal Grandmother 21  . Heart attack Paternal Grandfather   . Cancer Maternal Uncle        unknown type, dx. >65  . Cancer Paternal Aunt        unknown type, dx. >50  . Cancer Paternal Uncle        unknown type, dx. >50  . Cirrhosis Maternal Uncle   . Skin cancer Daughter   . Leukemia Cousin        maternal first cousin; dx. >50, in remission  . Cancer Niece        unknown type behind her ear, dx. 30s/40s  . Colon cancer Neg Hx      Current Outpatient Medications:  .  amLODipine (NORVASC) 10 MG tablet, Take 1 tablet (10 mg total) by mouth daily., Disp: 90 tablet, Rfl: 1 .  aspirin EC 81 MG tablet, Take 1 tablet (81 mg total) by mouth daily., Disp: , Rfl:  .  atorvastatin (LIPITOR) 40 MG tablet, Take 1 tablet (40 mg total) by mouth daily., Disp: 90 tablet, Rfl: 1 .  Blood Glucose Monitoring Suppl (TRUE METRIX GO GLUCOSE METER) w/Device KIT, 30 Units by Does not apply route daily., Disp: 1 kit, Rfl: 0 .  Calcium Carbonate-Vit D-Min (CALTRATE 600+D PLUS MINERALS) 600-800 MG-UNIT TABS, Take 1 tablet by mouth daily., Disp: , Rfl:  .  clopidogrel (PLAVIX) 75 MG tablet, TAKE 1 TABLET (75 MG TOTAL) BY MOUTH EVERY EVENING., Disp: 90 tablet, Rfl: 1 .  cyanocobalamin (,VITAMIN B-12,) 1000 MCG/ML injection, ADMINISTER 1 ML(1000 MCG) IN THE MUSCLE every 30 days, Disp: 10 mL, Rfl: 10 .   fluticasone furoate-vilanterol (BREO ELLIPTA) 200-25 MCG/INH AEPB, Inhale 1 puff into the lungs daily., Disp: 120 each, Rfl: 5 .  glucose blood (TRUE METRIX BLOOD GLUCOSE TEST) test strip, TEST BLOOD GLUCOSE THREE TIMES DAILY, Disp: 300 strip, Rfl: 2 .  HYDROcodone-acetaminophen (NORCO) 5-325 MG tablet, Take 1 tablet by mouth every 6 (six) hours as needed for moderate pain., Disp: 120 tablet, Rfl: 0 .  insulin aspart (NOVOLOG) 100 UNIT/ML injection, Inject 14 Units into the skin 3 (three) times daily before meals., Disp: 15 mL, Rfl: 9 .  insulin glargine, 2 Unit Dial, (TOUJEO MAX SOLOSTAR) 300 UNIT/ML Solostar Pen, Inject 16 Units into the  skin at bedtime., Disp: 15 mL, Rfl: 11 .  Insulin Pen Needle 32G X 4 MM MISC, 1 Device by Does not apply route in the morning, at noon, in the evening, and at bedtime., Disp: 400 each, Rfl: 3 .  isosorbide mononitrate (IMDUR) 30 MG 24 hr tablet, TAKE 1 AND 1/2 TABLETS EVERY DAY, Disp: 135 tablet, Rfl: 1 .  letrozole (FEMARA) 2.5 MG tablet, Take 1 tablet (2.5 mg total) by mouth daily., Disp: 90 tablet, Rfl: 3 .  LORazepam (ATIVAN) 0.5 MG tablet, TAKE 1 TABLET BY MOUTH EVERY 8 HOURS AS NEEDED FOR ANXIETY(DO NOT EXCEED MORE THAN 2 TABS IN 24 HRS), Disp: 60 tablet, Rfl: 2 .  MAGNESIUM OXIDE 400 PO, Take 400 mg by mouth 2 (two) times daily., Disp: , Rfl:  .  metoprolol succinate (TOPROL-XL) 100 MG 24 hr tablet, TAKE 1 TABLET EVERY DAY  WITH  OR  IMMEDIATELY FOLLOWING A MEAL, Disp: 90 tablet, Rfl: 1 .  nitroGLYCERIN (NITROSTAT) 0.4 MG SL tablet, Place 1 tablet (0.4 mg total) under the tongue every 5 (five) minutes as needed for chest pain., Disp: 24 tablet, Rfl: 1 .  polyethylene glycol (MIRALAX / GLYCOLAX) 17 g packet, Take 17 g by mouth as needed., Disp: , Rfl:  .  potassium chloride (KLOR-CON) 10 MEQ tablet, Take 1 tablet (10 mEq total) by mouth daily., Disp: 90 tablet, Rfl: 1 .  potassium chloride (KLOR-CON) 10 MEQ tablet, TAKE 1 TABLET (10 MEQ TOTAL) BY MOUTH DAILY.,  Disp: 90 tablet, Rfl: 1 .  promethazine (PHENERGAN) 12.5 MG tablet, Take 2 tablets (25 mg total) by mouth every 6 (six) hours as needed for nausea., Disp: 12 tablet, Rfl: 0 .  SYRINGE-NEEDLE, DISP, 3 ML (BD SAFETYGLIDE SYRINGE/NEEDLE) 25G X 1" 3 ML MISC, Use for B12 injections, Disp: 100 each, Rfl: 11 .  torsemide (DEMADEX) 20 MG tablet, Take 2 tablets (40 mg total) by mouth daily., Disp: 180 tablet, Rfl: 3 No current facility-administered medications for this visit.  Facility-Administered Medications Ordered in Other Visits:  .  sodium chloride 0.9 % 1,000 mL with magnesium sulfate 2 g infusion, , Intravenous, Continuous, Ladell Pier, MD, Stopped at 08/04/14 1441  Review of Systems:  Constitutional: Denies fever, chills, diaphoresis, appetite change and fatigue.  HEENT: Denies photophobia, eye pain, redness, hearing loss, ear pain, congestion, sore throat, rhinorrhea, sneezing, mouth sores, trouble swallowing, neck pain, neck stiffness and tinnitus.   Respiratory: Denies  cough, chest tightness,  and wheezing.   Cardiovascular: Denies chest pain, palpitations. Gastrointestinal: Denies nausea, vomiting, abdominal pain, diarrhea, constipation, blood in stool and abdominal distention.  Genitourinary: Denies dysuria, urgency, frequency, hematuria, flank pain and difficulty urinating.  Endocrine: Denies: hot or cold intolerance, sweats, changes in hair or nails, polyuria, polydipsia. Musculoskeletal: Denies myalgias, back pain, joint swelling, arthralgias and gait problem.  Skin: Denies pallor, rash and wound.  Neurological: Denies dizziness, seizures, syncope, weakness, light-headedness, numbness and headaches.  Hematological: Denies adenopathy. Easy bruising, personal or family bleeding history  Psychiatric/Behavioral: Denies suicidal ideation, mood changes, confusion, nervousness, sleep disturbance and agitation    Physical Exam: Vitals:   08/23/20 1510  BP: (!) 130/58  Pulse: 69   Temp: 98.3 F (36.8 C)  TempSrc: Oral  SpO2: 96%  Weight: 162 lb 8 oz (73.7 kg)    Body mass index is 29.72 kg/m.   Constitutional: NAD, calm, comfortable Eyes: PERRL, lids and conjunctivae normal, wears corrective lenses ENMT: Mucous membranes are moist. Respiratory: clear to auscultation bilaterally, no  wheezing, no crackles. Normal respiratory effort. No accessory muscle use.  Cardiovascular: Regular rate and rhythm, no murmurs / rubs / gallops.  2+ pitting edema from mid shins down. Neurologic: Grossly intact and nonfocal Psychiatric: Normal judgment and insight. Alert and oriented x 3. Normal mood.    Impression and Plan:  Acute on chronic diastolic CHF (congestive heart failure) ([place]) -She again has some lower extremity edema, mild dyspnea on exertion and has gained 4 pounds since her last office visit on August 17. -She has been taking torsemide 40 mg twice daily. -I have advised for the next 5-day to take 3 torsemide tablets. -She will come see me again in 2 weeks. -If she worsens, has been advised to follow-up with either me or cardiology before that timeframe.  Type 2 diabetes mellitus with hyperglycemia, with long-term current use of insulin (HCC) -Will leave Toujeo at 16 and increase mealtime NovoLog from 12 to 14 units. -She will return in 4 weeks for follow-up.  Essential hypertension -Blood pressure is well controlled.  Need for influenza vaccination -Flu vaccine administered.    Patient Instructions  -Nice seeing you today!!  -Increase novolog from 12 to 14 units with meals.  -Increase torsemide from 2 to 3 tablets for the next 5 days. Then go back to taking 2 tablets.  -Schedule follow up in 1 month.       Lelon Frohlich, MD [facility]

## 2020-08-23 NOTE — Patient Instructions (Signed)
-  Nice seeing you today!!  -Increase novolog from 12 to 14 units with meals.  -Increase torsemide from 2 to 3 tablets for the next 5 days. Then go back to taking 2 tablets.  -Schedule follow up in 1 month.

## 2020-08-30 ENCOUNTER — Other Ambulatory Visit: Payer: Self-pay | Admitting: Internal Medicine

## 2020-08-30 DIAGNOSIS — M542 Cervicalgia: Secondary | ICD-10-CM

## 2020-08-30 DIAGNOSIS — F419 Anxiety disorder, unspecified: Secondary | ICD-10-CM

## 2020-08-30 MED ORDER — HYDROCODONE-ACETAMINOPHEN 5-325 MG PO TABS
1.0000 | ORAL_TABLET | Freq: Four times a day (QID) | ORAL | 0 refills | Status: DC | PRN
Start: 2020-08-30 — End: 2020-10-04

## 2020-08-30 MED ORDER — LORAZEPAM 0.5 MG PO TABS: ORAL_TABLET | ORAL | 2 refills | Status: DC

## 2020-08-30 NOTE — Telephone Encounter (Signed)
visit done 07/18/20 okay to fill hydrocodone and lorazepam

## 2020-08-30 NOTE — Telephone Encounter (Signed)
HYDROcodone-acetaminophen (NORCO) 5-325 MG tablet   [facility] [phone] - [place], [place] [address] Phone:  [phone]  Fax:  [phone]       Patient is completely out and didn't realize that she didn't have any more refills at the pharmacy.  She said that it is really important that she gets them filled today.   Please advise

## 2020-08-30 NOTE — Addendum Note (Signed)
Addended by: Westley Hummer B on: 08/30/2020 11:40 AM   Modules accepted: Orders

## 2020-08-30 NOTE — Telephone Encounter (Signed)
Refills sent

## 2020-09-03 ENCOUNTER — Ambulatory Visit: Payer: Medicare HMO | Admitting: Internal Medicine

## 2020-09-12 ENCOUNTER — Ambulatory Visit
Admission: RE | Admit: 2020-09-12 | Discharge: 2020-09-12 | Disposition: A | Discharge status: Home / Self Care | Payer: Medicare HMO | Source: Ambulatory Visit | Attending: Nurse Practitioner | Admitting: Nurse Practitioner

## 2020-09-12 ENCOUNTER — Other Ambulatory Visit: Payer: Self-pay

## 2020-09-12 DIAGNOSIS — M810 Age-related osteoporosis without current pathological fracture: Secondary | ICD-10-CM | POA: Diagnosis not present

## 2020-09-12 DIAGNOSIS — C9201 Acute myeloblastic leukemia, in remission: Secondary | ICD-10-CM

## 2020-09-12 DIAGNOSIS — Z780 Asymptomatic menopausal state: Secondary | ICD-10-CM | POA: Diagnosis not present

## 2020-09-12 DIAGNOSIS — Z170 Estrogen receptor positive status [ER+]: Secondary | ICD-10-CM

## 2020-09-12 DIAGNOSIS — C50412 Malignant neoplasm of upper-outer quadrant of left female breast: Secondary | ICD-10-CM

## 2020-09-12 LAB — HM DEXA SCAN

## 2020-09-19 ENCOUNTER — Inpatient Hospital Stay: Payer: Medicare HMO

## 2020-09-19 ENCOUNTER — Inpatient Hospital Stay: Payer: Medicare HMO | Attending: Oncology

## 2020-09-19 ENCOUNTER — Other Ambulatory Visit: Payer: Self-pay

## 2020-09-19 DIAGNOSIS — C50412 Malignant neoplasm of upper-outer quadrant of left female breast: Secondary | ICD-10-CM

## 2020-09-19 DIAGNOSIS — C9201 Acute myeloblastic leukemia, in remission: Secondary | ICD-10-CM | POA: Diagnosis not present

## 2020-09-19 DIAGNOSIS — Z170 Estrogen receptor positive status [ER+]: Secondary | ICD-10-CM

## 2020-09-19 DIAGNOSIS — Z95828 Presence of other vascular implants and grafts: Secondary | ICD-10-CM

## 2020-09-19 LAB — CBC WITH DIFFERENTIAL (CANCER CENTER ONLY)
Abs Immature Granulocytes: 0.01 10*3/uL (ref 0.00–0.07)
Basophils Absolute: 0 10*3/uL (ref 0.0–0.1)
Basophils Relative: 0 %
Eosinophils Absolute: 0.1 10*3/uL (ref 0.0–0.5)
Eosinophils Relative: 2 %
HCT: 29.6 % — ABNORMAL LOW (ref 36.0–46.0)
Hemoglobin: 9.9 g/dL — ABNORMAL LOW (ref 12.0–15.0)
Immature Granulocytes: 0 %
Lymphocytes Relative: 27 %
Lymphs Abs: 1.5 10*3/uL (ref 0.7–4.0)
MCH: 31.1 pg (ref 26.0–34.0)
MCHC: 33.4 g/dL (ref 30.0–36.0)
MCV: 93.1 fL (ref 80.0–100.0)
Monocytes Absolute: 0.5 10*3/uL (ref 0.1–1.0)
Monocytes Relative: 9 %
Neutro Abs: 3.5 10*3/uL (ref 1.7–7.7)
Neutrophils Relative %: 62 %
Platelet Count: 131 10*3/uL — ABNORMAL LOW (ref 150–400)
RBC: 3.18 MIL/uL — ABNORMAL LOW (ref 3.87–5.11)
RDW: 12.7 % (ref 11.5–15.5)
WBC Count: 5.6 10*3/uL (ref 4.0–10.5)
nRBC: 0 % (ref 0.0–0.2)

## 2020-09-19 MED ORDER — SODIUM CHLORIDE 0.9% FLUSH
10.0000 mL | INTRAVENOUS | Status: DC | PRN
  Administered 2020-09-19 (×2): 10 mL
  Filled 2020-09-19: qty 10

## 2020-09-19 MED ORDER — SODIUM CHLORIDE 0.9 % IJ SOLN
10.0000 mL | INTRAMUSCULAR | Status: DC | PRN
  Filled 2020-09-19: qty 10

## 2020-09-19 MED ORDER — HEPARIN SOD (PORK) LOCK FLUSH 100 UNIT/ML IV SOLN
500.0000 [IU] | INTRAVENOUS | Status: DC | PRN
  Administered 2020-09-19 (×2): 500 [IU]
  Filled 2020-09-19: qty 5

## 2020-09-19 MED ORDER — HEPARIN SOD (PORK) LOCK FLUSH 100 UNIT/ML IV SOLN
500.0000 [IU] | Freq: Once | INTRAVENOUS | Status: DC | PRN
  Filled 2020-09-19: qty 5

## 2020-09-19 NOTE — Patient Instructions (Signed)

## 2020-09-20 ENCOUNTER — Other Ambulatory Visit: Payer: Self-pay | Admitting: Nurse Practitioner

## 2020-09-20 ENCOUNTER — Telehealth: Payer: Self-pay

## 2020-09-20 DIAGNOSIS — C9201 Acute myeloblastic leukemia, in remission: Secondary | ICD-10-CM

## 2020-09-20 NOTE — Telephone Encounter (Signed)
-----   Message from Owens Shark, NP sent at 09/20/2020  2:44 PM EDT ----- Please let her know hemoglobin was lower on recent labs.  Dr. [name] would like repeat labs in about 3 weeks.  I have entered the lab orders and sent the LOS.

## 2020-09-20 NOTE — Telephone Encounter (Signed)
-----   Message from Owens Shark, NP sent at 09/20/2020  9:07 AM EDT ----- Please forward the bone density study to her PCP and see if they will help [place] manage the osteoporosis.  Thanks

## 2020-09-20 NOTE — Telephone Encounter (Signed)
Faxed Bone Density study to [facility] [employer] per Lattie Haw NP

## 2020-09-20 NOTE — Telephone Encounter (Signed)
Called patient on home number and cell phone. No answer. Home phone voicemail was full. Left message on cell phone voicemail. Notified of hemoglobin being lower and wanting labs repeated in three weeks. Advised patient to call back to the office with any further questions.

## 2020-09-21 ENCOUNTER — Ambulatory Visit: Payer: Medicare HMO | Admitting: Internal Medicine

## 2020-09-24 ENCOUNTER — Telehealth: Payer: Self-pay | Admitting: Internal Medicine

## 2020-09-24 NOTE — Progress Notes (Signed)
  Chronic Care Management   Outreach Note  09/24/2020 Name: [name] MRN: [id number] DOB: 02-13-1941  Referred by: Isaac Bliss, Rayford Halsted, MD Reason for referral : No chief complaint on file.   An unsuccessful telephone outreach was attempted today. The patient was referred to the pharmacist for assistance with care management and care coordination.   Follow Up Plan:   Carley Perdue [employer] Scheduler

## 2020-09-27 ENCOUNTER — Other Ambulatory Visit: Payer: Self-pay | Admitting: Internal Medicine

## 2020-10-03 ENCOUNTER — Other Ambulatory Visit: Payer: Self-pay | Admitting: Internal Medicine

## 2020-10-03 ENCOUNTER — Encounter: Payer: Self-pay | Admitting: Cardiovascular Disease

## 2020-10-03 ENCOUNTER — Encounter: Payer: Self-pay | Admitting: Internal Medicine

## 2020-10-03 ENCOUNTER — Ambulatory Visit (INDEPENDENT_AMBULATORY_CARE_PROVIDER_SITE_OTHER): Payer: Medicare HMO

## 2020-10-03 ENCOUNTER — Other Ambulatory Visit: Payer: Self-pay

## 2020-10-03 ENCOUNTER — Ambulatory Visit (INDEPENDENT_AMBULATORY_CARE_PROVIDER_SITE_OTHER): Payer: Medicare HMO | Admitting: Internal Medicine

## 2020-10-03 VITALS — BP 135/60 | HR 71 | Temp 98.2°F | Wt 167.4 lb

## 2020-10-03 DIAGNOSIS — J9811 Atelectasis: Secondary | ICD-10-CM | POA: Diagnosis not present

## 2020-10-03 DIAGNOSIS — I517 Cardiomegaly: Secondary | ICD-10-CM | POA: Diagnosis not present

## 2020-10-03 DIAGNOSIS — I5033 Acute on chronic diastolic (congestive) heart failure: Secondary | ICD-10-CM

## 2020-10-03 DIAGNOSIS — E109 Type 1 diabetes mellitus without complications: Secondary | ICD-10-CM

## 2020-10-03 DIAGNOSIS — R0602 Shortness of breath: Secondary | ICD-10-CM | POA: Diagnosis not present

## 2020-10-03 DIAGNOSIS — J90 Pleural effusion, not elsewhere classified: Secondary | ICD-10-CM | POA: Diagnosis not present

## 2020-10-03 NOTE — Patient Instructions (Signed)
-  Nice seeing you today!!  -Lab work today; will notify you once results are available.  -Take 3 tablets of torsemide instead of 2 until you see cardiology.  -Chest Xray today.  -If you get worse before you can get in to see cardiology, please go to the ED.

## 2020-10-03 NOTE — Progress Notes (Signed)
Established Patient Office Visit     This visit occurred during the SARS-CoV-2 public health emergency.  Safety protocols were in place, including screening questions prior to the visit, additional usage of staff PPE, and extensive cleaning of exam room while observing appropriate contact time as indicated for disinfecting solutions.    CC/Reason for Visit: Worsening shortness of breath  HPI: [name] is a 79 y.o. female who is coming in today for the above mentioned reasons.  I see her mainly for history of hypertension and diabetes.  I last saw her on September 16.  At that point she was going through a mild heart failure exacerbation with increased lower extremity edema, 4 pound weight gain since her visit a month prior.  She is on 40 mg of torsemide twice daily.  She was asked to take it 3 times daily for a week and return in 2 weeks for follow-up.  I have not seen her since, it has now been 4 weeks.  She now tells me that she has had progressive shortness of breath with exertion, she has a significant shortness of breath when she lies down flat at nighttime in her bed.  She has gained an additional 4 pounds in the last month.  She denies any chest pain.  In regards to her diabetes, she finally brings in her glucometer.  I am concerned because she has very labile diabetes.  She has very high blood sugars throughout the day in the range of upper 200s to low 400s but then has significantly frequent episodes of hypoglycemia in the early morning hours of anywhere between 35 and 59.   Past Medical/Surgical History: Past Medical History:  Diagnosis Date  . acute myeloblastic leukemia (AML) dx'd 06/2014  . Acute myelogenous leukemia ([place]) 02/2014  . Acute pancreatitis   . Arthritis    "back" (09/17/2017)  . Atrial flutter ([place])   . Cardiomyopathy ([place])   . Chronic kidney disease, stage II (mild)   . Chronic lower back pain   . Colon polyps 04/29/2010   TUBULAR ADENOMA AND A SERRATED  ADENOMA  . COPD (chronic obstructive pulmonary disease) ([place])   . CORONARY ARTERY DISEASE 12/24/2007  . DIABETES MELLITUS, TYPE II 07/13/2007  . Family history of cervical cancer   . Family history of esophageal cancer   . Family history of leukemia   . Family history of lung cancer   . Family history of skin cancer   . History of blood transfusion 2015   "related to leukemia"  . History of kidney stones 12/24/2007  . History of uterine cancer   . HYPERLIPIDEMIA 12/24/2007  . HYPERTENSION 07/13/2007  . NSTEMI (non-ST elevated myocardial infarction) ([place]) 09/17/2017  . Unspecified disease of pancreas   . Uterine cancer [facility])     Past Surgical History:  Procedure Laterality Date  . ABDOMINAL HYSTERECTOMY     "still have my ovaries"  . BREAST LUMPECTOMY WITH RADIOACTIVE SEED LOCALIZATION Left 10/24/2019   Procedure: LEFT BREAST LUMPECTOMY WITH RADIOACTIVE SEED LOCALIZATION;  Surgeon: Jovita Kussmaul, MD;  Location: [facility];  Service: General;  Laterality: Left;  . CARDIAC CATHETERIZATION  2002  . CARDIAC CATHETERIZATION  09/17/2017  . CATARACT EXTRACTION W/ INTRAOCULAR LENS  IMPLANT, BILATERAL Bilateral   . COLONOSCOPY W/ BIOPSIES AND POLYPECTOMY  2011  . CORONARY STENT INTERVENTION N/A 09/21/2017   Procedure: CORONARY STENT INTERVENTION;  Surgeon: Troy Sine, MD;  Location: [facility];  Service: Cardiovascular;  Laterality: N/A;  . DILATION AND CURETTAGE OF UTERUS    . EXCISIONAL HEMORRHOIDECTOMY  X 2  . GANGLION CYST EXCISION Left X 3  . INTRAVASCULAR PRESSURE WIRE/FFR STUDY N/A 09/17/2017   Procedure: INTRAVASCULAR PRESSURE WIRE/FFR STUDY;  Surgeon: Nelva Bush, MD;  Location: [facility];  Service: Cardiovascular;  Laterality: N/A;  . NASAL SINUS SURGERY    . PORTA CATH INSERTION Right 2013  . RIGHT/LEFT HEART CATH AND CORONARY ANGIOGRAPHY N/A 09/17/2017   Procedure: RIGHT/LEFT HEART CATH AND CORONARY ANGIOGRAPHY;  Surgeon: Nelva Bush, MD;  Location: [facility];  Service: Cardiovascular;  Laterality: N/A;  . TUBAL LIGATION      Social History:  reports that she quit smoking about 19 years ago. Her smoking use included cigarettes. She has a 20.00 pack-year smoking history. She has never used smokeless tobacco. She reports that she does not drink alcohol and does not use drugs.  Allergies: No Known Allergies  Family History:  Family History  Problem Relation Age of Onset  . COPD Father   . Esophageal cancer Father   . Breast cancer Sister 12  . Lung cancer Sister   . Esophageal cancer Brother   . Suicidality Brother   . Lung cancer Sister   . Lung cancer Sister   . Cervical cancer Sister   . Congestive Heart Failure Mother   . Heart attack Maternal Grandmother   . Skin cancer Maternal Grandfather   . Heart attack Maternal Grandfather   . Stroke Paternal Grandmother 5  . Heart attack Paternal Grandfather   . Cancer Maternal Uncle        unknown type, dx. >65  . Cancer Paternal Aunt        unknown type, dx. >50  . Cancer Paternal Uncle        unknown type, dx. >50  . Cirrhosis Maternal Uncle   . Skin cancer Daughter   . Leukemia Cousin        maternal first cousin; dx. >50, in remission  . Cancer Niece        unknown type behind her ear, dx. 30s/40s  . Colon cancer Neg Hx      Current Outpatient Medications:  .  amLODipine (NORVASC) 10 MG tablet, Take 1 tablet (10 mg total) by mouth daily., Disp: 90 tablet, Rfl: 1 .  aspirin EC 81 MG tablet, Take 1 tablet (81 mg total) by mouth daily., Disp: , Rfl:  .  atorvastatin (LIPITOR) 40 MG tablet, Take 1 tablet (40 mg total) by mouth daily., Disp: 90 tablet, Rfl: 1 .  Blood Glucose Monitoring Suppl (TRUE METRIX GO GLUCOSE METER) w/Device KIT, 30 Units by Does not apply route daily., Disp: 1 kit, Rfl: 0 .  Calcium Carbonate-Vit D-Min (CALTRATE 600+D PLUS MINERALS) 600-800 MG-UNIT TABS, Take 1 tablet by mouth daily., Disp: , Rfl:  .   clopidogrel (PLAVIX) 75 MG tablet, TAKE 1 TABLET (75 MG TOTAL) BY MOUTH EVERY EVENING., Disp: 90 tablet, Rfl: 1 .  cyanocobalamin (,VITAMIN B-12,) 1000 MCG/ML injection, ADMINISTER 1 ML(1000 MCG) IN THE MUSCLE every 30 days, Disp: 10 mL, Rfl: 10 .  fluticasone furoate-vilanterol (BREO ELLIPTA) 200-25 MCG/INH AEPB, Inhale 1 puff into the lungs daily., Disp: 120 each, Rfl: 5 .  glucose blood (TRUE METRIX BLOOD GLUCOSE TEST) test strip, TEST BLOOD GLUCOSE THREE TIMES DAILY, Disp: 300 strip, Rfl: 2 .  HYDROcodone-acetaminophen (NORCO) 5-325 MG tablet, Take 1 tablet by mouth every 6 (six) hours  as needed for moderate pain., Disp: 120 tablet, Rfl: 0 .  HYDROcodone-acetaminophen (NORCO/VICODIN) 5-325 MG tablet, Take 1 tablet by mouth every 6 (six) hours as needed for moderate pain., Disp: 120 tablet, Rfl: 0 .  HYDROcodone-acetaminophen (NORCO/VICODIN) 5-325 MG tablet, Take 1 tablet by mouth every 6 (six) hours as needed for moderate pain., Disp: 120 tablet, Rfl: 0 .  HYDROcodone-acetaminophen (NORCO/VICODIN) 5-325 MG tablet, Take 1 tablet by mouth every 6 (six) hours as needed for moderate pain., Disp: 120 tablet, Rfl: 0 .  insulin aspart (NOVOLOG) 100 UNIT/ML injection, Inject 14 Units into the skin 3 (three) times daily before meals., Disp: 15 mL, Rfl: 9 .  insulin glargine, 2 Unit Dial, (TOUJEO MAX SOLOSTAR) 300 UNIT/ML Solostar Pen, Inject 16 Units into the skin at bedtime., Disp: 15 mL, Rfl: 11 .  Insulin Pen Needle 32G X 4 MM MISC, 1 Device by Does not apply route in the morning, at noon, in the evening, and at bedtime., Disp: 400 each, Rfl: 3 .  isosorbide mononitrate (IMDUR) 30 MG 24 hr tablet, TAKE 1 AND 1/2 TABLETS EVERY DAY, Disp: 135 tablet, Rfl: 1 .  letrozole (FEMARA) 2.5 MG tablet, Take 1 tablet (2.5 mg total) by mouth daily., Disp: 90 tablet, Rfl: 3 .  LORazepam (ATIVAN) 0.5 MG tablet, TAKE 1 TABLET BY MOUTH EVERY 8 HOURS AS NEEDED FOR ANXIETY(DO NOT EXCEED MORE THAN 2 TABS IN 24 HRS), Disp: 60  tablet, Rfl: 2 .  MAGNESIUM OXIDE 400 PO, Take 400 mg by mouth 2 (two) times daily., Disp: , Rfl:  .  metoprolol succinate (TOPROL-XL) 100 MG 24 hr tablet, TAKE 1 TABLET EVERY DAY  WITH  OR  IMMEDIATELY FOLLOWING A MEAL, Disp: 90 tablet, Rfl: 1 .  nitroGLYCERIN (NITROSTAT) 0.4 MG SL tablet, Place 1 tablet (0.4 mg total) under the tongue every 5 (five) minutes as needed for chest pain., Disp: 24 tablet, Rfl: 1 .  polyethylene glycol (MIRALAX / GLYCOLAX) 17 g packet, Take 17 g by mouth as needed., Disp: , Rfl:  .  potassium chloride (KLOR-CON) 10 MEQ tablet, Take 1 tablet (10 mEq total) by mouth daily., Disp: 90 tablet, Rfl: 1 .  potassium chloride (KLOR-CON) 10 MEQ tablet, TAKE 1 TABLET (10 MEQ TOTAL) BY MOUTH DAILY., Disp: 90 tablet, Rfl: 1 .  promethazine (PHENERGAN) 12.5 MG tablet, Take 2 tablets (25 mg total) by mouth every 6 (six) hours as needed for nausea., Disp: 12 tablet, Rfl: 0 .  SYRINGE-NEEDLE, DISP, 3 ML (BD SAFETYGLIDE SYRINGE/NEEDLE) 25G X 1" 3 ML MISC, Use for B12 injections, Disp: 100 each, Rfl: 11 .  torsemide (DEMADEX) 20 MG tablet, Take 2 tablets (40 mg total) by mouth daily., Disp: 180 tablet, Rfl: 3 No current facility-administered medications for this visit.  Facility-Administered Medications Ordered in Other Visits:  .  sodium chloride 0.9 % 1,000 mL with magnesium sulfate 2 g infusion, , Intravenous, Continuous, Ladell Pier, MD, Stopped at 08/04/14 1441  Review of Systems:  Constitutional: Denies fever, chills, diaphoresis, appetite change. HEENT: Denies photophobia, eye pain, redness, hearing loss, ear pain, congestion, sore throat, rhinorrhea, sneezing, mouth sores, trouble swallowing, neck pain, neck stiffness and tinnitus.   Respiratory: Denies  cough, chest tightness,  and wheezing.   Cardiovascular: Denies chest pain, palpitations. Gastrointestinal: Denies nausea, vomiting, abdominal pain, diarrhea, constipation, blood in stool and abdominal distention.   Genitourinary: Denies dysuria, urgency, frequency, hematuria, flank pain and difficulty urinating.  Endocrine: Denies: hot or cold intolerance, sweats, changes in hair  or nails, polyuria, polydipsia. Musculoskeletal: Denies myalgias, back pain, joint swelling, arthralgias and gait problem.  Skin: Denies pallor, rash and wound.  Neurological: Denies dizziness, seizures, syncope, weakness, light-headedness, numbness and headaches.  Hematological: Denies adenopathy. Easy bruising, personal or family bleeding history  Psychiatric/Behavioral: Denies suicidal ideation, mood changes, confusion, nervousness, sleep disturbance and agitation    Physical Exam: Vitals:   10/03/20 1443  BP: 135/60  Pulse: 71  Temp: 98.2 F (36.8 C)  TempSrc: Oral  SpO2: 96%  Weight: 167 lb 6.4 oz (75.9 kg)    Body mass index is 30.62 kg/m.   Constitutional: NAD, calm, comfortable Eyes: PERRL, lids and conjunctivae normal, wears corrective lenses ENMT: Mucous membranes are moist.  Respiratory: Good air movement, bilateral crackles more prominent over the right mid and lower lung fields. Cardiovascular: Regular rate and rhythm, no murmurs / rubs / gallops.  3+ pitting lower extremity edema.   Neurologic: Grossly intact and nonfocal Psychiatric: Normal judgment and insight. Alert and oriented x 3. Normal mood.    Impression and Plan:  Acute on chronic diastolic CHF (congestive heart failure) ([place]) -Last echo from June 2021 shows an ejection fraction of 55 to 60%, no wall motion abnormalities. -She has now had an 8 pound weight gain in the past 8 weeks  with orthopnea, shortness of breath on exertion and significant lower extremity edema. -I will order labs, chest x-ray and get her back urgently to see her cardiologist. -I have asked her to take 3 torsemide tablets until she sees cardiology next. -I actually think that she would benefit from IV diuresis, however she is hesitant to go to the ED. -She has  been counseled to go to the ED if her symptoms worsen at all before she is in to see cardiology.  Diabetes mellitus, labile ([place]) -As she is insulin-dependent with extremely labile diabetes, I believe it is time for her to see endocrinology, I will initiate referral.  Time spent: 43 minutes. Greater than 50% of this time was spent in direct contact with the patient, coordinating care and discussing relevant ongoing clinical issues, including importance of increasing IV diuresis, necessity of x-ray and labs, my belief that she would benefit from emergent care.    Patient Instructions  -Nice seeing you today!!  -Lab work today; will notify you once results are available.  -Take 3 tablets of torsemide instead of 2 until you see cardiology.  -Chest Xray today.  -If you get worse before you can get in to see cardiology, please go to the ED.     Lelon Frohlich, MD [facility]

## 2020-10-03 NOTE — Progress Notes (Signed)
Cardiology Office Note:    Date:  10/04/2020   ID:  [name], DOB Aug 18, 1941, MRN [id number]  PCP:  Isaac Bliss, Rayford Halsted, MD  Cardiologist:     Referring MD: Isaac Bliss, Estel*   Chief Complaint  Patient presents with  . Hypertension  . Congestive Heart Failure    Previous notes    LAKINDRA WIBLE is a 79 y.o. female with a hx of coronary artery disease and a non-ST segment elevation myocardial infarction.  She was admitted in #2018 with a non-ST segment elevation myocardial infarction in the setting of acute combined systolic and diastolic congestive heart failure.  Her ejection fraction was 40-45%.  Patient has chronic kidney disease and we treat her medically in an effort to avoid cardiac catheterization at that time.  She was seen in follow-up on October 10 and continued to have shortness of breath and occasional chest tightness.  We ultimately did do heart cath which showed severe three-vessel coronary artery disease.  She was seen by Dr. Servando Snare for surgical consultation.  She was thought to be too high risk for CABG. She subsequently had PCI to her right coronary artery.  He was found to have a moderate LAD stenosis with an FFR of 0.72 at that time.   She is been treated medically with the thought that we would intervene on the LAD if she continued to have episodes of chest pain and shortness of breath.  She is feeling well.   Some dyspnea.   No CP   May 30, 2020:  Samiya  is seen today for follow-up of her coronary artery disease and chronic combined systolic and diastolic congestive heart failure.  She was seen in the emergency room for congestive heart failure on June 1.  She was not admitted but was sent home with the home health CHF nurses.  She received IV Lasix for several days. She had a recent echocardiogram performed May 08, 2020 which reveals normal left ventricular systolic function with EF of 55 to 60%. There is trivial mitral regurgitation.     She was changed from furosemide to torsemide and is still feeling quite well. Still has some leg edema.   Breathing is much better.  Tries to avoid salt  We discussed leg elevation and compression hose.   Labs from June 16 reveal a creatinine of 1.64.  Her potassium is normal. I considered trying an ARB but since her creatinine has gone up a little bit I suspect that she will be better off staying with what she is on now.   Oct. 28, 2021: Makell is seen today for follow up of her CAD and CHF. Eats fast foods several times a week . Has had some leg swelling.   Is also short of breath  Has hx of leukemia. Hb is down to 7.  This may be partially responsible for her dyspnea and leg swelling   Her medical doctor increased her Demadex to 60 mg a day  She does not think that she has put out much additional urine today    Past Medical History:  Diagnosis Date  . acute myeloblastic leukemia (AML) dx'd 06/2014  . Acute myelogenous leukemia ([place]) 02/2014  . Acute pancreatitis   . Arthritis    "back" (09/17/2017)  . Atrial flutter ([place])   . Cardiomyopathy ([place])   . Chronic kidney disease, stage II (mild)   . Chronic lower back pain   . Colon polyps 04/29/2010   TUBULAR ADENOMA  AND A SERRATED ADENOMA  . COPD (chronic obstructive pulmonary disease) ([place])   . CORONARY ARTERY DISEASE 12/24/2007  . DIABETES MELLITUS, TYPE II 07/13/2007  . Family history of cervical cancer   . Family history of esophageal cancer   . Family history of leukemia   . Family history of lung cancer   . Family history of skin cancer   . History of blood transfusion 2015   "related to leukemia"  . History of kidney stones 12/24/2007  . History of uterine cancer   . HYPERLIPIDEMIA 12/24/2007  . HYPERTENSION 07/13/2007  . NSTEMI (non-ST elevated myocardial infarction) ([place]) 09/17/2017  . Unspecified disease of pancreas   . Uterine cancer [facility])     Past Surgical History:  Procedure Laterality Date  . ABDOMINAL  HYSTERECTOMY     "still have my ovaries"  . BREAST LUMPECTOMY WITH RADIOACTIVE SEED LOCALIZATION Left 10/24/2019   Procedure: LEFT BREAST LUMPECTOMY WITH RADIOACTIVE SEED LOCALIZATION;  Surgeon: Jovita Kussmaul, MD;  Location: [place];  Service: General;  Laterality: Left;  . CARDIAC CATHETERIZATION  2002  . CARDIAC CATHETERIZATION  09/17/2017  . CATARACT EXTRACTION W/ INTRAOCULAR LENS  IMPLANT, BILATERAL Bilateral   . COLONOSCOPY W/ BIOPSIES AND POLYPECTOMY  2011  . CORONARY STENT INTERVENTION N/A 09/21/2017   Procedure: CORONARY STENT INTERVENTION;  Surgeon: Troy Sine, MD;  Location: [place] [facility];  Service: Cardiovascular;  Laterality: N/A;  . DILATION AND CURETTAGE OF UTERUS    . EXCISIONAL HEMORRHOIDECTOMY  X 2  . GANGLION CYST EXCISION Left X 3  . INTRAVASCULAR PRESSURE WIRE/FFR STUDY N/A 09/17/2017   Procedure: INTRAVASCULAR PRESSURE WIRE/FFR STUDY;  Surgeon: Nelva Bush, MD;  Location: [place] [facility];  Service: Cardiovascular;  Laterality: N/A;  . NASAL SINUS SURGERY    . PORTA CATH INSERTION Right 2013  . RIGHT/LEFT HEART CATH AND CORONARY ANGIOGRAPHY N/A 09/17/2017   Procedure: RIGHT/LEFT HEART CATH AND CORONARY ANGIOGRAPHY;  Surgeon: Nelva Bush, MD;  Location: [place] [facility];  Service: Cardiovascular;  Laterality: N/A;  . TUBAL LIGATION      Current Medications: Current Meds  Medication Sig  . amLODipine (NORVASC) 10 MG tablet Take 1 tablet (10 mg total) by mouth daily.  Marland Kitchen aspirin EC 81 MG tablet Take 1 tablet (81 mg total) by mouth daily.  Marland Kitchen atorvastatin (LIPITOR) 40 MG tablet Take 1 tablet (40 mg total) by mouth daily.  . Blood Glucose Monitoring Suppl (TRUE METRIX GO GLUCOSE METER) w/Device KIT 30 Units by Does not apply route daily.  . Calcium Carbonate-Vit D-Min (CALTRATE 600+D PLUS MINERALS) 600-800 MG-UNIT TABS Take 1 tablet by mouth daily.  . clopidogrel (PLAVIX) 75 MG tablet TAKE 1 TABLET (75 MG TOTAL) BY MOUTH EVERY  EVENING.  . cyanocobalamin (,VITAMIN B-12,) 1000 MCG/ML injection ADMINISTER 1 ML(1000 MCG) IN THE MUSCLE every 30 days  . fluticasone furoate-vilanterol (BREO ELLIPTA) 200-25 MCG/INH AEPB Inhale 1 puff into the lungs daily.  Marland Kitchen glucose blood (TRUE METRIX BLOOD GLUCOSE TEST) test strip TEST BLOOD GLUCOSE THREE TIMES DAILY  . HYDROcodone-acetaminophen (NORCO) 5-325 MG tablet Take 1 tablet by mouth every 6 (six) hours as needed for moderate pain.  Marland Kitchen insulin aspart (NOVOLOG) 100 UNIT/ML injection Inject 14 Units into the skin 3 (three) times daily before meals.  . insulin glargine, 2 Unit Dial, (TOUJEO MAX SOLOSTAR) 300 UNIT/ML Solostar Pen Inject 16 Units into the skin at bedtime.  . Insulin Pen Needle 32G X 4 MM MISC 1 Device by  Does not apply route in the morning, at noon, in the evening, and at bedtime.  . isosorbide mononitrate (IMDUR) 30 MG 24 hr tablet TAKE 1 AND 1/2 TABLETS EVERY DAY  . letrozole (FEMARA) 2.5 MG tablet Take 1 tablet (2.5 mg total) by mouth daily.  Marland Kitchen LORazepam (ATIVAN) 0.5 MG tablet TAKE 1 TABLET BY MOUTH EVERY 8 HOURS AS NEEDED FOR ANXIETY(DO NOT EXCEED MORE THAN 2 TABS IN 24 HRS)  . MAGNESIUM OXIDE 400 PO Take 400 mg by mouth 2 (two) times daily.  . metoprolol succinate (TOPROL-XL) 100 MG 24 hr tablet TAKE 1 TABLET EVERY DAY  WITH  OR  IMMEDIATELY FOLLOWING A MEAL  . nitroGLYCERIN (NITROSTAT) 0.4 MG SL tablet Place 1 tablet (0.4 mg total) under the tongue every 5 (five) minutes as needed for chest pain.  . polyethylene glycol (MIRALAX / GLYCOLAX) 17 g packet Take 17 g by mouth as needed.  . potassium chloride (KLOR-CON) 10 MEQ tablet Take 1 tablet (10 mEq total) by mouth daily.  . promethazine (PHENERGAN) 12.5 MG tablet Take 2 tablets (25 mg total) by mouth every 6 (six) hours as needed for nausea.  Marland Kitchen SYRINGE-NEEDLE, DISP, 3 ML (BD SAFETYGLIDE SYRINGE/NEEDLE) 25G X 1" 3 ML MISC Use for B12 injections  . torsemide (DEMADEX) 20 MG tablet Take 2 tablets (40 mg total) by mouth  daily.  . [DISCONTINUED] HYDROcodone-acetaminophen (NORCO/VICODIN) 5-325 MG tablet Take 1 tablet by mouth every 6 (six) hours as needed for moderate pain.  . [DISCONTINUED] HYDROcodone-acetaminophen (NORCO/VICODIN) 5-325 MG tablet Take 1 tablet by mouth every 6 (six) hours as needed for moderate pain.  . [DISCONTINUED] HYDROcodone-acetaminophen (NORCO/VICODIN) 5-325 MG tablet Take 1 tablet by mouth every 6 (six) hours as needed for moderate pain.     Allergies:   Patient has no known allergies.   Social History   Socioeconomic History  . Marital status: Married    Spouse name: Not on file  . Number of children: Not on file  . Years of education: Not on file  . Highest education level: Not on file  Occupational History  . Occupation: works in a lab    Comment: makes glasses  Tobacco Use  . Smoking status: Former Smoker    Packs/day: 1.00    Years: 20.00    Pack years: 20.00    Types: Cigarettes    Quit date: 12/08/2000    Years since quitting: 19.8  . Smokeless tobacco: Never Used  Vaping Use  . Vaping Use: Never used  Substance and Sexual Activity  . Alcohol use: No    Alcohol/week: 0.0 standard drinks  . Drug use: No  . Sexual activity: Never  Other Topics Concern  . Not on file  Social History Narrative   Still works full-time assembling eye glasses _0    Social Determinants of Radio broadcast assistant Strain:   . Difficulty of Paying Living Expenses: Not on file  Food Insecurity:   . Worried About Charity fundraiser in the Last Year: Not on file  . Ran Out of Food in the Last Year: Not on file  Transportation Needs: No Transportation Needs  . Lack of Transportation (Medical): No  . Lack of Transportation (Non-Medical): No  Physical Activity:   . Days of Exercise per Week: Not on file  . Minutes of Exercise per Session: Not on file  Stress:   . Feeling of Stress : Not on file  Social Connections:   . Frequency of Communication with  Friends and Family: Not  on file  . Frequency of Social Gatherings with Friends and Family: Not on file  . Attends Religious Services: Not on file  . Active Member of Clubs or Organizations: Not on file  . Attends Archivist Meetings: Not on file  . Marital Status: Not on file     Family History: The patient's family history includes Breast cancer (age of onset: 27) in her sister; COPD in her father; Cancer in her maternal uncle, niece, paternal aunt, and paternal uncle; Cervical cancer in her sister; Cirrhosis in her maternal uncle; Congestive Heart Failure in her mother; Esophageal cancer in her brother and father; Heart attack in her maternal grandfather, maternal grandmother, and paternal grandfather; Leukemia in her cousin; Lung cancer in her sister, sister, and sister; Skin cancer in her daughter and maternal grandfather; Stroke (age of onset: 39) in her paternal grandmother; Suicidality in her brother. There is no history of Colon cancer.  ROS:   Please see the history of present illness.     All other systems reviewed and are negative.  EKGs/Labs/Other Studies Reviewed:    The following studies were reviewed today:   EKG:    Recent Labs: 05/08/2020: ALT 29; B Natriuretic Peptide 570.5; BUN 15; Creatinine, Ser 1.26; Potassium 4.5; Sodium 142 09/19/2020: Hemoglobin 9.9; Platelet Count 131  Recent Lipid Panel    Component Value Date/Time   CHOL 130 02/17/2018 1005   TRIG 161 (H) 02/17/2018 1005   HDL 38 (L) 02/17/2018 1005   CHOLHDL 3.4 02/17/2018 1005   CHOLHDL 4.5 11/22/2017 0354   VLDL 44 (H) 11/22/2017 0354   LDLCALC 60 02/17/2018 1005    Physical Exam:     Physical Exam: Blood pressure (!) 144/48, pulse 62, height 5' 2" (1.575 m), weight 164 lb 3.2 oz (74.5 kg), SpO2 97 %.  GEN:  Elderly female,   HEENT: Normal NECK: Mildly elevated JVD; No carotid bruits LYMPHATICS: No lymphadenopathy CARDIAC: RRR , no murmurs, rubs, gallops RESPIRATORY:  Clear to auscultation without rales,  wheezing or rhonchi  ABDOMEN: Soft, non-tender, non-distended MUSCULOSKELETAL:  1-2 + pitting edema  SKIN: Warm and dry NEUROLOGIC:  Alert and oriented x 3    ASSESSMENT:    1. Acute on chronic diastolic CHF (congestive heart failure) (HCC)   2. Leg swelling    PLAN:      1. Coronary artery disease: Sephira presents for follow-up of her coronary artery disease.  She had a non-ST segment elevation myocardial infarction in September, 2018.  She has subsequent stenting of her right coronary artery in October, 2018.  She is doing well.  No episodes of angina   2.  Essential hypertension:    3.  Chronic diastolic congestive heart failure:: She presents with worsening shortness of breath, fatigue and leg swelling.  It also should be noted that her hemoglobin is around 7.  She does have mildly elevated JVD.  Lungs sound clear today. She eats out / gets fast foods twice a week .   Admits to eating more salt than she should  We will increase her torsemide to 40 mg twice a day for the next 5 days.  We will also double her potassium to 10 mEq twice a day.  I have advised her to greatly reduce her salt intake.  Of instructed her to elevate her legs at night and periodically through the day.  Have given her information on the lounge doctor leg rest.  She just had an  echocardiogram several months ago.  She was found to have normal left ventricular systolic function.  I will see her at her previously scheduled appt in Dec.    Medication Adjustments/Labs and Tests Ordered: Current medicines are reviewed at length with the patient today.  Concerns regarding medicines are outlined above.  Orders Placed This Encounter  Procedures  . Basic Metabolic Panel (BMET)   No orders of the defined types were placed in this encounter.   Signed, Mertie Moores, MD  10/04/2020 2:13 PM    [facility]

## 2020-10-04 ENCOUNTER — Ambulatory Visit: Payer: Medicare HMO | Admitting: Cardiovascular Disease

## 2020-10-04 ENCOUNTER — Other Ambulatory Visit: Payer: Self-pay

## 2020-10-04 ENCOUNTER — Encounter: Payer: Self-pay | Admitting: Cardiovascular Disease

## 2020-10-04 VITALS — BP 144/48 | HR 62 | Ht 62.0 in | Wt 164.2 lb

## 2020-10-04 DIAGNOSIS — M7989 Other specified soft tissue disorders: Secondary | ICD-10-CM | POA: Diagnosis not present

## 2020-10-04 DIAGNOSIS — I5033 Acute on chronic diastolic (congestive) heart failure: Secondary | ICD-10-CM

## 2020-10-04 DIAGNOSIS — I5042 Chronic combined systolic (congestive) and diastolic (congestive) heart failure: Secondary | ICD-10-CM

## 2020-10-04 NOTE — Patient Instructions (Addendum)
Medication Instructions:  Your physician has recommended you make the following change in your medication:  INCREASE Torsemide (Demadex) to 40 mg twice daily for 5 days (Friday, Sat, Sun, Mon, Tues) INCREASE Kdur (potassium chloride) to 20 mEq for the same 5 days  *If you need a refill on your cardiac medications before your next appointment, please call your pharmacy*   Lab Work: TODAY - basic metabolic panel If you have labs (blood work) drawn today and your tests are completely normal, you will receive your results only by: Marland Kitchen MyChart Message (if you have MyChart) OR . A paper copy in the mail If you have any lab test that is abnormal or we need to change your treatment, we will call you to review the results.   Testing/Procedures: None Ordered    Follow-Up: At [facility], you and your health needs are our priority.  As part of our continuing mission to provide you with exceptional heart care, we have created designated Provider Care Teams.  These Care Teams include your primary Cardiologist (physician) and Advanced Practice Providers (APPs -  Physician Assistants and Nurse Practitioners) who all work together to provide you with the care you need, when you need it.  We recommend signing up for the patient portal called "MyChart".  Sign up information is provided on this After Visit Summary.  MyChart is used to connect with patients for Virtual Visits (Telemedicine).  Patients are able to view lab/test results, encounter notes, upcoming appointments, etc.  Non-urgent messages can be sent to your provider as well.   To learn more about what you can do with MyChart, go to [email or web address].    Your next appointment:   5 week(s) on December 13  The format for your next appointment:   In Person  Provider:   Mertie Moores, MD   Other Instructions For your  leg edema you  should do  the following 1. Leg elevation - I recommend the Lounge Dr. Leg rest.  See below for  details  2. Salt restriction  -  Use potassium chloride instead of regular salt as a salt substitute. 3. Walk regularly 4. Compression hose - [facility] 5. Weight loss    Available on [email or web address] Or  Go to [email or web address]       DASH Eating Plan DASH stands for "Dietary Approaches to Stop Hypertension." The DASH eating plan is a healthy eating plan that has been shown to reduce high blood pressure (hypertension). It may also reduce your risk for type 2 diabetes, heart disease, and stroke. The DASH eating plan may also help with weight loss. What are tips for following this plan?  General guidelines  Avoid eating more than 2,300 mg (milligrams) of salt (sodium) a day. If you have hypertension, you may need to reduce your sodium intake to 1,500 mg a day.  Limit alcohol intake to no more than 1 drink a day for nonpregnant women and 2 drinks a day for men. One drink equals 12 oz of beer, 5 oz of wine, or 1 oz of hard liquor.  Work with your health care provider to maintain a healthy body weight or to lose weight. Ask what an ideal weight is for you.  Get at least 30 minutes of exercise that causes your heart to beat faster (aerobic exercise) most days of the week. Activities may include walking, swimming, or biking.  Work with your health care provider or diet and nutrition specialist (dietitian) to adjust your eating plan to  your individual calorie needs. Reading food labels   Check food labels for the amount of sodium per serving. Choose foods with less than 5 percent of the Daily Value of sodium. Generally, foods with less than 300 mg of sodium per serving fit into this eating plan.  To find whole grains, look for the word "whole" as the first word in the ingredient list. Shopping  Buy products labeled as "low-sodium" or "no salt added."  Buy fresh foods. Avoid canned foods and premade or frozen meals. Cooking  Avoid adding salt when cooking. Use salt-free  seasonings or herbs instead of table salt or sea salt. Check with your health care provider or pharmacist before using salt substitutes.  Do not fry foods. Cook foods using healthy methods such as baking, boiling, grilling, and broiling instead.  Cook with heart-healthy oils, such as olive, canola, soybean, or sunflower oil. Meal planning  Eat a balanced diet that includes: ? 5 or more servings of fruits and vegetables each day. At each meal, try to fill half of your plate with fruits and vegetables. ? Up to 6-8 servings of whole grains each day. ? Less than 6 oz of lean meat, poultry, or fish each day. A 3-oz serving of meat is about the same size as a deck of cards. One egg equals 1 oz. ? 2 servings of low-fat dairy each day. ? A serving of nuts, seeds, or beans 5 times each week. ? Heart-healthy fats. Healthy fats called Omega-3 fatty acids are found in foods such as flaxseeds and coldwater fish, like sardines, salmon, and mackerel.  Limit how much you eat of the following: ? Canned or prepackaged foods. ? Food that is high in trans fat, such as fried foods. ? Food that is high in saturated fat, such as fatty meat. ? Sweets, desserts, sugary drinks, and other foods with added sugar. ? Full-fat dairy products.  Do not salt foods before eating.  Try to eat at least 2 vegetarian meals each week.  Eat more home-cooked food and less restaurant, buffet, and fast food.  When eating at a restaurant, ask that your food be prepared with less salt or no salt, if possible. What foods are recommended? The items listed may not be a complete list. Talk with your dietitian about what dietary choices are best for you. Grains Whole-grain or whole-wheat bread. Whole-grain or whole-wheat pasta. [place]ff rice. [place]. Bulgur. Whole-grain and low-sodium cereals. Pita bread. Low-fat, low-sodium crackers. Whole-wheat flour tortillas. Vegetables Fresh or frozen vegetables (raw, steamed, roasted, or  grilled). Low-sodium or reduced-sodium tomato and vegetable juice. Low-sodium or reduced-sodium tomato sauce and tomato paste. Low-sodium or reduced-sodium canned vegetables. Fruits All fresh, dried, or frozen fruit. Canned fruit in natural juice (without added sugar). Meat and other protein foods Skinless chicken or [place]. Ground chicken or [place]. Pork with fat trimmed off. Fish and seafood. Egg whites. Dried beans, peas, or lentils. Unsalted nuts, nut butters, and seeds. Unsalted canned beans. Lean cuts of beef with fat trimmed off. Low-sodium, lean deli meat. Dairy Low-fat (1%) or fat-free (skim) milk. Fat-free, low-fat, or reduced-fat cheeses. Nonfat, low-sodium ricotta or cottage cheese. Low-fat or nonfat yogurt. Low-fat, low-sodium cheese. Fats and oils Soft margarine without trans fats. Vegetable oil. Low-fat, reduced-fat, or light mayonnaise and salad dressings (reduced-sodium). Canola, safflower, olive, soybean, and sunflower oils. Avocado. Seasoning and other foods Herbs. Spices. Seasoning mixes without salt. Unsalted popcorn and pretzels. Fat-free sweets. What foods are not recommended? The items listed may not  be a complete list. Talk with your dietitian about what dietary choices are best for you. Grains Baked goods made with fat, such as croissants, muffins, or some breads. Dry pasta or rice meal packs. Vegetables Creamed or fried vegetables. Vegetables in a cheese sauce. Regular canned vegetables (not low-sodium or reduced-sodium). Regular canned tomato sauce and paste (not low-sodium or reduced-sodium). Regular tomato and vegetable juice (not low-sodium or reduced-sodium). Angie Fava. Olives. Fruits Canned fruit in a light or heavy syrup. Fried fruit. Fruit in cream or butter sauce. Meat and other protein foods Fatty cuts of meat. Ribs. Fried meat. Berniece Salines. Sausage. Bologna and other processed lunch meats. Salami. Fatback. Hotdogs. Bratwurst. Salted nuts and seeds. Canned beans with  added salt. Canned or smoked fish. Whole eggs or egg yolks. Chicken or [place] with skin. Dairy Whole or 2% milk, cream, and half-and-half. Whole or full-fat cream cheese. Whole-fat or sweetened yogurt. Full-fat cheese. Nondairy creamers. Whipped toppings. Processed cheese and cheese spreads. Fats and oils Butter. Stick margarine. Lard. Shortening. Ghee. Bacon fat. Tropical oils, such as coconut, palm kernel, or palm oil. Seasoning and other foods Salted popcorn and pretzels. Onion salt, garlic salt, seasoned salt, table salt, and sea salt. Worcestershire sauce. Tartar sauce. Barbecue sauce. Teriyaki sauce. Soy sauce, including reduced-sodium. Steak sauce. Canned and packaged gravies. Fish sauce. Oyster sauce. Cocktail sauce. Horseradish that you find on the shelf. Ketchup. Mustard. Meat flavorings and tenderizers. Bouillon cubes. Hot sauce and Tabasco sauce. Premade or packaged marinades. Premade or packaged taco seasonings. Relishes. Regular salad dressings. Where to find more information:  National Heart, Lung, and [place]: https://wilson-eaton.com/  American Heart Association: www.heart.org Summary  The DASH eating plan is a healthy eating plan that has been shown to reduce high blood pressure (hypertension). It may also reduce your risk for type 2 diabetes, heart disease, and stroke.  With the DASH eating plan, you should limit salt (sodium) intake to 2,300 mg a day. If you have hypertension, you may need to reduce your sodium intake to 1,500 mg a day.  When on the DASH eating plan, aim to eat more fresh fruits and vegetables, whole grains, lean proteins, low-fat dairy, and heart-healthy fats.  Work with your health care provider or diet and nutrition specialist (dietitian) to adjust your eating plan to your individual calorie needs. This information is not intended to replace advice given to you by your health care provider. Make sure you discuss any questions you have with your health care  provider. Document Revised: 11/06/2017 Document Reviewed: 11/17/2016 Elsevier Patient Education  2020 [employer].

## 2020-10-05 ENCOUNTER — Telehealth: Payer: Medicare HMO

## 2020-10-05 LAB — BASIC METABOLIC PANEL
BUN/Creatinine Ratio: 13 (ref 12–28)
BUN: 19 mg/dL (ref 8–27)
CO2: 22 mmol/L (ref 20–29)
Calcium: 8.5 mg/dL — ABNORMAL LOW (ref 8.7–10.3)
Chloride: 109 mmol/L — ABNORMAL HIGH (ref 96–106)
Creatinine, Ser: 1.46 mg/dL — ABNORMAL HIGH (ref 0.57–1.00)
GFR calc Af Amer: 39 mL/min/{1.73_m2} — ABNORMAL LOW (ref >59)
GFR calc non Af Amer: 34 mL/min/{1.73_m2} — ABNORMAL LOW (ref >59)
Glucose: 133 mg/dL — ABNORMAL HIGH (ref 65–99)
Potassium: 4 mmol/L (ref 3.5–5.2)
Sodium: 145 mmol/L — ABNORMAL HIGH (ref 134–144)

## 2020-10-08 ENCOUNTER — Telehealth: Payer: Self-pay

## 2020-10-08 ENCOUNTER — Telehealth: Payer: Self-pay | Admitting: Cardiology

## 2020-10-08 NOTE — Telephone Encounter (Signed)
Follow Up:     Pt is returning a call from this morning, she did not know who called her.

## 2020-10-08 NOTE — Telephone Encounter (Signed)
-----   Message from Thayer Headings, MD sent at 10/08/2020  8:31 AM EDT ----- Her creatinine and BUN are mildly elevated compared to previous levels.   She has increased her torsemide and potassium for 5 days for worsening leg swelling.   We will continue to watch her renal function closely.  I suspect her leg edema is largely due to her diet , inactivity, and venous stasis than worsening CHF

## 2020-10-08 NOTE — Telephone Encounter (Signed)
Left a message to call the office back regarding her lab results.

## 2020-10-08 NOTE — Telephone Encounter (Signed)
The patient has been notified of the result and verbalized understanding.  All questions (if any) were answered. Wilma Flavin, RN 10/08/2020 9:21 AM

## 2020-10-10 ENCOUNTER — Other Ambulatory Visit: Payer: Self-pay | Admitting: Oncology

## 2020-10-10 ENCOUNTER — Inpatient Hospital Stay: Payer: Medicare HMO | Attending: Oncology

## 2020-10-10 ENCOUNTER — Other Ambulatory Visit: Payer: Self-pay

## 2020-10-10 DIAGNOSIS — C9201 Acute myeloblastic leukemia, in remission: Secondary | ICD-10-CM | POA: Diagnosis not present

## 2020-10-10 LAB — CBC WITH DIFFERENTIAL (CANCER CENTER ONLY)
Abs Immature Granulocytes: 0.01 10*3/uL (ref 0.00–0.07)
Basophils Absolute: 0 10*3/uL (ref 0.0–0.1)
Basophils Relative: 0 %
Eosinophils Absolute: 0.2 10*3/uL (ref 0.0–0.5)
Eosinophils Relative: 2 %
HCT: 30.8 % — ABNORMAL LOW (ref 36.0–46.0)
Hemoglobin: 10.5 g/dL — ABNORMAL LOW (ref 12.0–15.0)
Immature Granulocytes: 0 %
Lymphocytes Relative: 19 %
Lymphs Abs: 1.4 10*3/uL (ref 0.7–4.0)
MCH: 31.6 pg (ref 26.0–34.0)
MCHC: 34.1 g/dL (ref 30.0–36.0)
MCV: 92.8 fL (ref 80.0–100.0)
Monocytes Absolute: 0.6 10*3/uL (ref 0.1–1.0)
Monocytes Relative: 9 %
Neutro Abs: 5.1 10*3/uL (ref 1.7–7.7)
Neutrophils Relative %: 70 %
Platelet Count: 134 10*3/uL — ABNORMAL LOW (ref 150–400)
RBC: 3.32 MIL/uL — ABNORMAL LOW (ref 3.87–5.11)
RDW: 12.8 % (ref 11.5–15.5)
WBC Count: 7.3 10*3/uL (ref 4.0–10.5)
nRBC: 0 % (ref 0.0–0.2)

## 2020-10-10 LAB — CMP (CANCER CENTER ONLY)
ALT: 25 U/L (ref 0–44)
AST: 22 U/L (ref 15–41)
Albumin: 3.3 g/dL — ABNORMAL LOW (ref 3.5–5.0)
Alkaline Phosphatase: 107 U/L (ref 38–126)
Anion gap: 5 (ref 5–15)
BUN: 19 mg/dL (ref 8–23)
CO2: 30 mmol/L (ref 22–32)
Calcium: 8.2 mg/dL — ABNORMAL LOW (ref 8.9–10.3)
Chloride: 107 mmol/L (ref 98–111)
Creatinine: 1.81 mg/dL — ABNORMAL HIGH (ref 0.44–1.00)
GFR, Estimated: 28 mL/min — ABNORMAL LOW (ref >60)
Glucose, Bld: 176 mg/dL — ABNORMAL HIGH (ref 70–99)
Potassium: 3.5 mmol/L (ref 3.5–5.1)
Sodium: 142 mmol/L (ref 135–145)
Total Bilirubin: 0.6 mg/dL (ref 0.3–1.2)
Total Protein: 6.2 g/dL — ABNORMAL LOW (ref 6.5–8.1)

## 2020-10-12 ENCOUNTER — Encounter: Payer: Self-pay | Admitting: Internal Medicine

## 2020-10-15 ENCOUNTER — Encounter: Payer: Self-pay | Admitting: *Deleted

## 2020-10-15 NOTE — Progress Notes (Signed)
Faxed CMP and CBC results of 10/10/20 to Dr. Jerilee Hoh, PCP [phone] per Dr. [name] request.

## 2020-11-12 ENCOUNTER — Inpatient Hospital Stay: Payer: Medicare HMO

## 2020-11-12 ENCOUNTER — Telehealth: Payer: Self-pay | Admitting: Oncology

## 2020-11-12 ENCOUNTER — Other Ambulatory Visit: Payer: Self-pay

## 2020-11-12 ENCOUNTER — Inpatient Hospital Stay: Payer: Medicare HMO | Admitting: Oncology

## 2020-11-12 ENCOUNTER — Inpatient Hospital Stay: Payer: Medicare HMO | Attending: Oncology

## 2020-11-12 VITALS — BP 159/51 | HR 70 | Temp 98.0°F | Resp 17 | Ht 62.0 in | Wt 175.0 lb

## 2020-11-12 DIAGNOSIS — I4891 Unspecified atrial fibrillation: Secondary | ICD-10-CM | POA: Insufficient documentation

## 2020-11-12 DIAGNOSIS — Z23 Encounter for immunization: Secondary | ICD-10-CM

## 2020-11-12 DIAGNOSIS — C50912 Malignant neoplasm of unspecified site of left female breast: Secondary | ICD-10-CM | POA: Diagnosis not present

## 2020-11-12 DIAGNOSIS — C9201 Acute myeloblastic leukemia, in remission: Secondary | ICD-10-CM

## 2020-11-12 DIAGNOSIS — Z170 Estrogen receptor positive status [ER+]: Secondary | ICD-10-CM | POA: Diagnosis not present

## 2020-11-12 DIAGNOSIS — C9200 Acute myeloblastic leukemia, not having achieved remission: Secondary | ICD-10-CM | POA: Insufficient documentation

## 2020-11-12 DIAGNOSIS — D649 Anemia, unspecified: Secondary | ICD-10-CM | POA: Diagnosis not present

## 2020-11-12 DIAGNOSIS — Z79811 Long term (current) use of aromatase inhibitors: Secondary | ICD-10-CM | POA: Diagnosis not present

## 2020-11-12 DIAGNOSIS — Z452 Encounter for adjustment and management of vascular access device: Secondary | ICD-10-CM | POA: Diagnosis not present

## 2020-11-12 DIAGNOSIS — I5033 Acute on chronic diastolic (congestive) heart failure: Secondary | ICD-10-CM

## 2020-11-12 DIAGNOSIS — Z95828 Presence of other vascular implants and grafts: Secondary | ICD-10-CM

## 2020-11-12 LAB — CBC WITH DIFFERENTIAL (CANCER CENTER ONLY)
Abs Immature Granulocytes: 0.01 10*3/uL (ref 0.00–0.07)
Basophils Absolute: 0 10*3/uL (ref 0.0–0.1)
Basophils Relative: 0 %
Eosinophils Absolute: 0.1 10*3/uL (ref 0.0–0.5)
Eosinophils Relative: 2 %
HCT: 30.5 % — ABNORMAL LOW (ref 36.0–46.0)
Hemoglobin: 10 g/dL — ABNORMAL LOW (ref 12.0–15.0)
Immature Granulocytes: 0 %
Lymphocytes Relative: 23 %
Lymphs Abs: 1.4 10*3/uL (ref 0.7–4.0)
MCH: 30.9 pg (ref 26.0–34.0)
MCHC: 32.8 g/dL (ref 30.0–36.0)
MCV: 94.1 fL (ref 80.0–100.0)
Monocytes Absolute: 0.6 10*3/uL (ref 0.1–1.0)
Monocytes Relative: 10 %
Neutro Abs: 4 10*3/uL (ref 1.7–7.7)
Neutrophils Relative %: 65 %
Platelet Count: 125 10*3/uL — ABNORMAL LOW (ref 150–400)
RBC: 3.24 MIL/uL — ABNORMAL LOW (ref 3.87–5.11)
RDW: 12.9 % (ref 11.5–15.5)
WBC Count: 6.2 10*3/uL (ref 4.0–10.5)
nRBC: 0 % (ref 0.0–0.2)

## 2020-11-12 LAB — COMPREHENSIVE METABOLIC PANEL
ALT: 16 U/L (ref 0–44)
AST: 20 U/L (ref 15–41)
Albumin: 2.8 g/dL — ABNORMAL LOW (ref 3.5–5.0)
Alkaline Phosphatase: 109 U/L (ref 38–126)
Anion gap: 7 (ref 5–15)
BUN: 17 mg/dL (ref 8–23)
CO2: 27 mmol/L (ref 22–32)
Calcium: 7.8 mg/dL — ABNORMAL LOW (ref 8.9–10.3)
Chloride: 109 mmol/L (ref 98–111)
Creatinine, Ser: 1.77 mg/dL — ABNORMAL HIGH (ref 0.44–1.00)
GFR, Estimated: 29 mL/min — ABNORMAL LOW (ref >60)
Glucose, Bld: 190 mg/dL — ABNORMAL HIGH (ref 70–99)
Potassium: 3 mmol/L — ABNORMAL LOW (ref 3.5–5.1)
Sodium: 143 mmol/L (ref 135–145)
Total Bilirubin: 0.5 mg/dL (ref 0.3–1.2)
Total Protein: 5.5 g/dL — ABNORMAL LOW (ref 6.5–8.1)

## 2020-11-12 MED ORDER — SODIUM CHLORIDE 0.9% FLUSH
10.0000 mL | INTRAVENOUS | Status: DC | PRN
  Administered 2020-11-12: 10 mL via INTRAVENOUS
  Filled 2020-11-12: qty 10

## 2020-11-12 MED ORDER — HEPARIN SOD (PORK) LOCK FLUSH 100 UNIT/ML IV SOLN
500.0000 [IU] | Freq: Once | INTRAVENOUS | Status: AC
  Administered 2020-11-12: 500 [IU] via INTRAVENOUS
  Filled 2020-11-12: qty 5

## 2020-11-12 NOTE — Progress Notes (Signed)
[facility] OFFICE PROGRESS NOTE   Diagnosis: AML, breast cancer  INTERVAL HISTORY:   [name] returns as scheduled.  She continues letrozole.  No hot flashes or significant bone pain.  She has chronic intermittent back pain, chiefly of the lower back.  She has intermittent leg swelling and dyspnea.  Objective:  Vital signs in last 24 hours:  Blood pressure (!) 159/51, pulse 70, temperature 98 F (36.7 C), temperature source Tympanic, resp. rate 17, height _0  (1.575 m), weight 175 lb (79.4 kg), SpO2 100 %.    Lymphatics: No cervical or supraclavicular nodes.  Prominent left greater than right axillary fat pad with nodularity in the left axilla.  No discrete lymph node. Resp: Lungs clear bilaterally Cardio: Regular rate and rhythm GI: No hepatosplenomegaly Vascular: 1+ pitting edema at the right greater than left lower leg Breast: Status post left lumpectomy.  No evidence for local tumor recurrence.  No mass in either breast.  Portacath/PICC-without erythema  Lab Results:  Lab Results  Component Value Date   WBC 6.2 11/12/2020   HGB 10.0 (L) 11/12/2020   HCT 30.5 (L) 11/12/2020   MCV 94.1 11/12/2020   PLT 125 (L) 11/12/2020   NEUTROABS 4.0 11/12/2020    CMP  Lab Results  Component Value Date   NA 142 10/10/2020   K 3.5 10/10/2020   CL 107 10/10/2020   CO2 30 10/10/2020   GLUCOSE 176 (H) 10/10/2020   BUN 19 10/10/2020   CREATININE 1.81 (H) 10/10/2020   CALCIUM 8.2 (L) 10/10/2020   PROT 6.2 (L) 10/10/2020   ALBUMIN 3.3 (L) 10/10/2020   AST 22 10/10/2020   ALT 25 10/10/2020   ALKPHOS 107 10/10/2020   BILITOT 0.6 10/10/2020   GFRNONAA 28 (L) 10/10/2020   GFRAA 39 (L) 10/04/2020    Medications: I have reviewed the patient's current medications.   Assessment/Plan: 1. Acute myelogenous leukemia, monocytic differentiation diagnosed in March of 2015   Induction cytarabine/daunorubicin (7+3) followed by reinduction with  cytarabine/daunorubicin and zinecard (5+2) with a recovering bone marrow 05/16/2014 consistent with remission   Status post cycle 1 high-dose araC consolidation 05/30/2014   Status post cycle 2 high-dose araC consolidation 07/11/2014.  Status post cycle 3 high-dose araC consolidation 08/22/2014.  Bone marrow 09/15/2014 showed necrotic marrow with minute focus of myeloid precursors. No evidence of viable acute leukemia. 2.History of pancytopenia secondary to chemotherapy.  3. C. difficile colitis-06/09/2014 treated with Flagyl  4. Diabetes  5. renal insufficiency  6. history of hypokalemia and hypomagnesemia. 7. history of atrial fibrillation  9. history of coronary artery disease  10. Hospitalization with febrile neutropenia/sepsis secondary to Escherichia coli bacteremia 09/02/2014. 11. Shingles involving the right back October 2015. 12. Meningioma on brain CT 05/24/2014 and brain MRI 05/24/2014 13. Hospitalization09/17/2018 through 08/28/2017 with acute CHF. 14.Admission 11/21/2017 with expressive aphasia, seizure activity noted on EEG, placed on Keppra 15.  Left breast cancer, T1NX, status post a left lumpectomy 1116 2020-1.5 cm, grade 2, associated with high-grade DCIS with necrosis, ER 95%, PR 95%, HER-2 negative, KI-6710%  Letrozole 11/02/2019 16.  ER 05/08/2020-dyspnea  CT chest 05/08/2020-negative for pulmonary embolism, new patchy sclerosis throughout the thoracic skeleton concerning for bone metastases  MRI thoracic spine 06/08/2020-consistent with successfully treated marrow replacement process in 2015, current area of concern at T9 could represent a benign vascular lesion    Disposition: [name] is in clinical remission from breast cancer.  She will continue letrozole.  She does not have symptoms to  suggest bone metastases.  We discussed the indication for repeating a thoracic MRI.  I think it is very unlikely the T9 lesion represents a metastasis.  She is  comfortable with observation.  We will schedule an MRI if she develops increased back pain.  [name] has mild anemia.  The anemia may be related to treatment of leukemia or renal insufficiency.  We will consider a trial of erythropoietin for progressive anemia.  She remains in remission from AML.  [name] would like to keep the Port-A-Cath in place.  She will return for an office visit and Port-A-Cath flush in approximately 6 weeks.  The potassium returned low today.  She will increase the potassium chloride to twice daily.  She is scheduled to see Dr. Cathie Olden next week.  [name] received a COVID-19 booster vaccine today. Catherine Coder, MD  11/12/2020  3:38 PM

## 2020-11-12 NOTE — Progress Notes (Signed)
   Covid-19 Vaccination Clinic  Name:  [name]    MRN: [id number] DOB: Oct 02, 1941  11/12/2020  [name] was observed post Covid-19 immunization for 15 minutes without incident. She was provided with Vaccine Information Sheet and instruction to access the V-Safe system.   [name] was instructed to call 911 with any severe reactions post vaccine: Marland Kitchen Difficulty breathing  . Swelling of face and throat  . A fast heartbeat  . A bad rash all over body  . Dizziness and weakness   Immunizations Administered    Name Date Dose VIS Date Route   Pfizer COVID-19 Vaccine 11/12/2020  3:41 PM 0.3 mL 09/26/2020 Intramuscular   Manufacturer: [place]   Lot: [id number]   [place]: [id number]

## 2020-11-12 NOTE — Telephone Encounter (Signed)
Scheduled appointment per 12/6 los. Spoke to patient who is aware of appointment date and time. Patient requested latest appointment possible, she did not want to schedule with Lattie Haw which is why she was scheduled with Dr. [name].

## 2020-11-18 ENCOUNTER — Encounter: Payer: Self-pay | Admitting: Cardiovascular Disease

## 2020-11-18 NOTE — Progress Notes (Signed)
Cardiology Office Note:    Date:  11/19/2020   ID:  [name], DOB 11/22/1941, MRN [id number]  PCP:  Isaac Bliss, Rayford Halsted, MD  Cardiologist:  Nilza Eaker   Referring MD: Isaac Bliss, Estel*   Chief Complaint  Patient presents with  . Congestive Heart Failure    Previous notes    [name] is a 79 y.o. female with a hx of coronary artery disease and a non-ST segment elevation myocardial infarction.  She was admitted in #2018 with a non-ST segment elevation myocardial infarction in the setting of acute combined systolic and diastolic congestive heart failure.  Her ejection fraction was 40-45%.  Patient has chronic kidney disease and we treat her medically in an effort to avoid cardiac catheterization at that time.  She was seen in follow-up on October 10 and continued to have shortness of breath and occasional chest tightness.  We ultimately did do heart cath which showed severe three-vessel coronary artery disease.  She was seen by Dr. Servando Snare for surgical consultation.  She was thought to be too high risk for CABG. She subsequently had PCI to her right coronary artery.  He was found to have a moderate LAD stenosis with an FFR of 0.72 at that time.   She is been treated medically with the thought that we would intervene on the LAD if she continued to have episodes of chest pain and shortness of breath.  She is feeling well.   Some dyspnea.   No CP   May 30, 2020:  [name]  is seen today for follow-up of her coronary artery disease and chronic combined systolic and diastolic congestive heart failure.  She was seen in the emergency room for congestive heart failure on June 1.  She was not admitted but was sent home with the home health CHF nurses.  She received IV Lasix for several days. She had a recent echocardiogram performed May 08, 2020 which reveals normal left ventricular systolic function with EF of 55 to 60%. There is trivial mitral regurgitation.    She was  changed from furosemide to torsemide and is still feeling quite well. Still has some leg edema.   Breathing is much better.  Tries to avoid salt  We discussed leg elevation and compression hose.   Labs from June 16 reveal a creatinine of 1.64.  Her potassium is normal. I considered trying an ARB but since her creatinine has gone up a little bit I suspect that she will be better off staying with what she is on now.   Oct. 28, 2021: [name] is seen today for follow up of her CAD and CHF. Eats fast foods several times a week . Has had some leg swelling.   Is also short of breath  Has hx of leukemia. Hb is down to 7.  This may be partially responsible for her dyspnea and leg swelling   Her medical doctor increased her Demadex to 60 mg a day  She does not think that she has put out much additional urine today   Dec. 13, 2021: [name] is seen today for follow up of her CAD and CHF She was very anemic when I last saw her in Oct.  She has a hx of acute myelogenous leukemia  Has had some leg swelling ,  Better after she increased her torsemide  She takes torsemide 40 daily, every so often she will add torsemide 20 mg in the evening to help with swelling .  We  will increase her prescribed dose so that she will have enough tablets and not run out .  Check lipids and liver enz.  Today    Past Medical History:  Diagnosis Date  . acute myeloblastic leukemia (AML) dx'd 06/2014  . Acute myelogenous leukemia (HCC) 02/2014  . Acute pancreatitis   . Arthritis    "back" (09/17/2017)  . Atrial flutter (HCC)   . Cardiomyopathy (HCC)   . Chronic kidney disease, stage II (mild)   . Chronic lower back pain   . Colon polyps 04/29/2010   TUBULAR ADENOMA AND A SERRATED ADENOMA  . COPD (chronic obstructive pulmonary disease) (HCC)   . CORONARY ARTERY DISEASE 12/24/2007  . DIABETES MELLITUS, TYPE II 07/13/2007  . Family history of cervical cancer   . Family history of esophageal cancer   . Family history of  leukemia   . Family history of lung cancer   . Family history of skin cancer   . History of blood transfusion 2015   "related to leukemia"  . History of kidney stones 12/24/2007  . History of uterine cancer   . HYPERLIPIDEMIA 12/24/2007  . HYPERTENSION 07/13/2007  . NSTEMI (non-ST elevated myocardial infarction) (HCC) 09/17/2017  . Unspecified disease of pancreas   . Uterine cancer [facility])     Past Surgical History:  Procedure Laterality Date  . ABDOMINAL HYSTERECTOMY     "still have my ovaries"  . BREAST LUMPECTOMY WITH RADIOACTIVE SEED LOCALIZATION Left 10/24/2019   Procedure: LEFT BREAST LUMPECTOMY WITH RADIOACTIVE SEED LOCALIZATION;  Surgeon: Griselda Miner, MD;  Location: [facility];  Service: General;  Laterality: Left;  . CARDIAC CATHETERIZATION  2002  . CARDIAC CATHETERIZATION  09/17/2017  . CATARACT EXTRACTION W/ INTRAOCULAR LENS  IMPLANT, BILATERAL Bilateral   . COLONOSCOPY W/ BIOPSIES AND POLYPECTOMY  2011  . CORONARY STENT INTERVENTION N/A 09/21/2017   Procedure: CORONARY STENT INTERVENTION;  Surgeon: Lennette Bihari, MD;  Location: [facility];  Service: Cardiovascular;  Laterality: N/A;  . DILATION AND CURETTAGE OF UTERUS    . EXCISIONAL HEMORRHOIDECTOMY  X 2  . GANGLION CYST EXCISION Left X 3  . INTRAVASCULAR PRESSURE WIRE/FFR STUDY N/A 09/17/2017   Procedure: INTRAVASCULAR PRESSURE WIRE/FFR STUDY;  Surgeon: Yvonne Kendall, MD;  Location: [facility];  Service: Cardiovascular;  Laterality: N/A;  . NASAL SINUS SURGERY    . PORTA CATH INSERTION Right 2013  . RIGHT/LEFT HEART CATH AND CORONARY ANGIOGRAPHY N/A 09/17/2017   Procedure: RIGHT/LEFT HEART CATH AND CORONARY ANGIOGRAPHY;  Surgeon: Yvonne Kendall, MD;  Location: [facility];  Service: Cardiovascular;  Laterality: N/A;  . TUBAL LIGATION      Current Medications: Current Meds  Medication Sig  . amLODipine (NORVASC) 10 MG tablet Take 1 tablet (10 mg total) by mouth daily.   Marland Kitchen aspirin EC 81 MG tablet Take 1 tablet (81 mg total) by mouth daily.  Marland Kitchen atorvastatin (LIPITOR) 40 MG tablet Take 1 tablet (40 mg total) by mouth daily.  . Blood Glucose Monitoring Suppl (TRUE METRIX GO GLUCOSE METER) w/Device KIT 30 Units by Does not apply route daily.  . Calcium Carbonate-Vit D-Min (CALTRATE 600+D PLUS MINERALS) 600-800 MG-UNIT TABS Take 1 tablet by mouth daily.  . clopidogrel (PLAVIX) 75 MG tablet TAKE 1 TABLET (75 MG TOTAL) BY MOUTH EVERY EVENING.  . cyanocobalamin (,VITAMIN B-12,) 1000 MCG/ML injection ADMINISTER 1 ML(1000 MCG) IN THE MUSCLE every 30 days  . fluticasone furoate-vilanterol (BREO ELLIPTA) 200-25 MCG/INH AEPB Inhale 1  puff into the lungs daily.  Marland Kitchen glucose blood (TRUE METRIX BLOOD GLUCOSE TEST) test strip TEST BLOOD GLUCOSE THREE TIMES DAILY  . HYDROcodone-acetaminophen (NORCO) 5-325 MG tablet Take 1 tablet by mouth every 6 (six) hours as needed for moderate pain.  Marland Kitchen insulin aspart (NOVOLOG) 100 UNIT/ML injection Inject 14 Units into the skin 3 (three) times daily before meals.  . insulin glargine, 2 Unit Dial, (TOUJEO MAX SOLOSTAR) 300 UNIT/ML Solostar Pen Inject 16 Units into the skin at bedtime.  . Insulin Pen Needle 32G X 4 MM MISC 1 Device by Does not apply route in the morning, at noon, in the evening, and at bedtime.  . isosorbide mononitrate (IMDUR) 30 MG 24 hr tablet TAKE 1 AND 1/2 TABLETS EVERY DAY  . letrozole (FEMARA) 2.5 MG tablet TAKE 1 TABLET (2.5 MG TOTAL) BY MOUTH DAILY.  Marland Kitchen LORazepam (ATIVAN) 0.5 MG tablet TAKE 1 TABLET BY MOUTH EVERY 8 HOURS AS NEEDED FOR ANXIETY(DO NOT EXCEED MORE THAN 2 TABS IN 24 HRS)  . MAGNESIUM OXIDE 400 PO Take 400 mg by mouth 2 (two) times daily.  . metoprolol succinate (TOPROL-XL) 100 MG 24 hr tablet TAKE 1 TABLET EVERY DAY  WITH  OR  IMMEDIATELY FOLLOWING A MEAL  . nitroGLYCERIN (NITROSTAT) 0.4 MG SL tablet Place 1 tablet (0.4 mg total) under the tongue every 5 (five) minutes as needed for chest pain.  . polyethylene  glycol (MIRALAX / GLYCOLAX) 17 g packet Take 17 g by mouth as needed.  . potassium chloride (KLOR-CON) 10 MEQ tablet Take 1 tablet (10 mEq total) by mouth daily.  . promethazine (PHENERGAN) 12.5 MG tablet Take 2 tablets (25 mg total) by mouth every 6 (six) hours as needed for nausea.  Marland Kitchen SYRINGE-NEEDLE, DISP, 3 ML (BD SAFETYGLIDE SYRINGE/NEEDLE) 25G X 1" 3 ML MISC Use for B12 injections  . [DISCONTINUED] torsemide (DEMADEX) 20 MG tablet Take 2 tablets (40 mg total) by mouth daily.     Allergies:   Patient has no known allergies.   Social History   Socioeconomic History  . Marital status: Married    Spouse name: Not on file  . Number of children: Not on file  . Years of education: Not on file  . Highest education level: Not on file  Occupational History  . Occupation: works in a lab    Comment: makes glasses  Tobacco Use  . Smoking status: Former Smoker    Packs/day: 1.00    Years: 20.00    Pack years: 20.00    Types: Cigarettes    Quit date: 12/08/2000    Years since quitting: 19.9  . Smokeless tobacco: Never Used  Vaping Use  . Vaping Use: Never used  Substance and Sexual Activity  . Alcohol use: No    Alcohol/week: 0.0 standard drinks  . Drug use: No  . Sexual activity: Never  Other Topics Concern  . 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76    Social Determinants of Radio broadcast assistant Strain: Not on file  Food Insecurity: Not on file  Transportation Needs: Not on file  Physical Activity: Not on file  Stress: Not on file  Social Connections: Not on file     Family History: The patient's family history includes Breast cancer (age of onset: 53) in her sister; COPD in her father; Cancer in her maternal uncle, niece, paternal aunt, and paternal uncle; Cervical cancer in her sister; Cirrhosis in her maternal  uncle; Congestive Heart Failure in her mother; Esophageal cancer in her brother and father; Heart attack in  her maternal grandfather, maternal grandmother, and paternal grandfather; Leukemia in her cousin; Lung cancer in her sister, sister, and sister; Skin cancer in her daughter and maternal grandfather; Stroke (age of onset: 25) in her paternal grandmother; Suicidality in her brother. There is no history of Colon cancer.  ROS:   Please see the history of present illness.     All other systems reviewed and are negative.  EKGs/Labs/Other Studies Reviewed:    The following studies were reviewed today:   EKG:    Recent Labs: 05/08/2020: B Natriuretic Peptide 570.5 11/12/2020: ALT 16; BUN 17; Creatinine, Ser 1.77; Hemoglobin 10.0; Platelet Count 125; Potassium 3.0; Sodium 143  Recent Lipid Panel    Component Value Date/Time   CHOL 130 02/17/2018 1005   TRIG 161 (H) 02/17/2018 1005   HDL 38 (L) 02/17/2018 1005   CHOLHDL 3.4 02/17/2018 1005   CHOLHDL 4.5 11/22/2017 0354   VLDL 44 (H) 11/22/2017 0354   LDLCALC 60 02/17/2018 1005    Physical Exam:     Physical Exam: Blood pressure (!) 146/52, pulse 72, height 5' 2" (1.575 m), weight 167 lb 12.8 oz (76.1 kg), SpO2 94 %.  GEN:  Well nourished, well developed in no acute distress HEENT: Normal NECK: No JVD; No carotid bruits LYMPHATICS: No lymphadenopathy CARDIAC: RRR , soft systolic murmur  RESPIRATORY:  Clear to auscultation without rales, wheezing or rhonchi  ABDOMEN: Soft, non-tender, non-distended MUSCULOSKELETAL:  No edema; No deformity  SKIN: Warm and dry NEUROLOGIC:  Alert and oriented x 3     ASSESSMENT:    1. Chronic combined systolic and diastolic heart failure ([place])   2. Hyperlipidemia, unspecified hyperlipidemia type   3. Coronary artery disease involving native coronary artery of native heart without angina pectoris    PLAN:     1.  Coronary artery disease:   No angina   2.  Essential hypertension:    BP is generally well controlled.   A bit elevated here today . Advised salt restriction   3.  Chronic diastolic  congestive heart failure:: doing well.   She has increaed her torsemide as needed which keeps her leg edema under good control.    4.  Hyperlipidemia :   Cont atorvastatin.   Check lipids , liver enz today     Medication Adjustments/Labs and Tests Ordered: Current medicines are reviewed at length with the patient today.  Concerns regarding medicines are outlined above.  Orders Placed This Encounter  Procedures  . Lipid Profile  . Hepatic function panel   Meds ordered this encounter  Medications  . torsemide (DEMADEX) 20 MG tablet    Sig: Take 40 mg in the morning and 20 mg in the afternoon    Dispense:  270 tablet    Refill:  3    Signed, Mertie Moores, MD  11/19/2020 8:41 AM    [place]

## 2020-11-19 ENCOUNTER — Encounter: Payer: Self-pay | Admitting: Cardiovascular Disease

## 2020-11-19 ENCOUNTER — Other Ambulatory Visit: Payer: Self-pay

## 2020-11-19 ENCOUNTER — Ambulatory Visit: Payer: Medicare HMO | Admitting: Cardiovascular Disease

## 2020-11-19 VITALS — BP 146/52 | HR 72 | Ht 62.0 in | Wt 167.8 lb

## 2020-11-19 DIAGNOSIS — I5042 Chronic combined systolic (congestive) and diastolic (congestive) heart failure: Secondary | ICD-10-CM | POA: Diagnosis not present

## 2020-11-19 DIAGNOSIS — E785 Hyperlipidemia, unspecified: Secondary | ICD-10-CM | POA: Diagnosis not present

## 2020-11-19 DIAGNOSIS — I5033 Acute on chronic diastolic (congestive) heart failure: Secondary | ICD-10-CM | POA: Diagnosis not present

## 2020-11-19 DIAGNOSIS — I2510 Atherosclerotic heart disease of native coronary artery without angina pectoris: Secondary | ICD-10-CM

## 2020-11-19 LAB — LIPID PANEL
Chol/HDL Ratio: 2.3 ratio (ref 0.0–4.4)
Cholesterol, Total: 112 mg/dL (ref 100–199)
HDL: 49 mg/dL (ref >39)
LDL Chol Calc (NIH): 45 mg/dL (ref 0–99)
Triglycerides: 94 mg/dL (ref 0–149)
VLDL Cholesterol Cal: 18 mg/dL (ref 5–40)

## 2020-11-19 LAB — HEPATIC FUNCTION PANEL
ALT: 30 IU/L (ref 0–32)
AST: 19 IU/L (ref 0–40)
Albumin: 3.4 g/dL — ABNORMAL LOW (ref 3.7–4.7)
Alkaline Phosphatase: 154 IU/L — ABNORMAL HIGH (ref 44–121)
Bilirubin Total: 0.7 mg/dL (ref 0.0–1.2)
Bilirubin, Direct: 0.31 mg/dL (ref 0.00–0.40)
Total Protein: 5.7 g/dL — ABNORMAL LOW (ref 6.0–8.5)

## 2020-11-19 MED ORDER — TORSEMIDE 20 MG PO TABS
ORAL_TABLET | ORAL | 3 refills | Status: DC
Start: 2020-11-19 — End: 2021-12-09

## 2020-11-19 NOTE — Patient Instructions (Signed)
Medication Instructions:  Your physician has recommended you make the following change in your medication:  INCREASE Torsemide (Demadex) to 40 mg in the mornings and 20 mg in the afternoons  *If you need a refill on your cardiac medications before your next appointment, please call your pharmacy*   Lab Work: TODAY - liver panel, cholesterol  If you have labs (blood work) drawn today and your tests are completely normal, you will receive your results only by: Marland Kitchen MyChart Message (if you have MyChart) OR . A paper copy in the mail If you have any lab test that is abnormal or we need to change your treatment, we will call you to review the results.    Testing/Procedures: None Ordered    Follow-Up: At [facility], you and your health needs are our priority.  As part of our continuing mission to provide you with exceptional heart care, we have created designated Provider Care Teams.  These Care Teams include your primary Cardiologist (physician) and Advanced Practice Providers (APPs -  Physician Assistants and Nurse Practitioners) who all work together to provide you with the care you need, when you need it.  We recommend signing up for the patient portal called "MyChart".  Sign up information is provided on this After Visit Summary.  MyChart is used to connect with patients for Virtual Visits (Telemedicine).  Patients are able to view lab/test results, encounter notes, upcoming appointments, etc.  Non-urgent messages can be sent to your provider as well.   To learn more about what you can do with MyChart, go to [email or web address].    Your next appointment:   1 year(s)  The format for your next appointment:   In Person  Provider:   You may see Mertie Moores, MD or one of the following Advanced Practice Providers on your designated Care Team:    Richardson Dopp, PA-C  [place], [place]

## 2020-11-20 ENCOUNTER — Telehealth: Payer: Self-pay | Admitting: Cardiovascular Disease

## 2020-11-20 NOTE — Telephone Encounter (Signed)
[facility] is returning [name]'s call in regards to her lab results. Please advise.

## 2020-11-20 NOTE — Telephone Encounter (Signed)
Pt aware of lab results ./cy 

## 2020-11-20 NOTE — Telephone Encounter (Signed)
Labs are stable     Patient Communication

## 2020-11-24 ENCOUNTER — Other Ambulatory Visit: Payer: Self-pay | Admitting: Internal Medicine

## 2020-11-24 DIAGNOSIS — I10 Essential (primary) hypertension: Secondary | ICD-10-CM

## 2020-11-28 ENCOUNTER — Other Ambulatory Visit: Payer: Self-pay | Admitting: Internal Medicine

## 2020-11-28 DIAGNOSIS — F419 Anxiety disorder, unspecified: Secondary | ICD-10-CM

## 2020-11-28 DIAGNOSIS — M542 Cervicalgia: Secondary | ICD-10-CM

## 2020-11-28 NOTE — Telephone Encounter (Signed)
Patient is calling and requesting a refill for HYDROcodone-acetaminophen ([place]) 5-325 MG tablet sent to [facility] [place], [place] [address]  Phone:  [phone] Fax:  [phone] CB is [phone]

## 2020-12-03 NOTE — Telephone Encounter (Signed)
Pt is calling in to check the status of the Rx Hydrocodone-acetaminophen (NORCO) 5-325 MG and Lorazepam (ATIVAN) 0.5 MG.  Pt is aware that the provider is out of the office and we will see if there is someone can assist [place] with this.  Pt would like to have a call back to let her know if it is called in.

## 2020-12-04 NOTE — Telephone Encounter (Signed)
Patient had an office visit 10/03/20

## 2020-12-05 MED ORDER — HYDROCODONE-ACETAMINOPHEN 5-325 MG PO TABS: 1.0000 | ORAL_TABLET | Freq: Four times a day (QID) | ORAL | 0 refills | Status: DC | PRN

## 2020-12-05 MED ORDER — LORAZEPAM 0.5 MG PO TABS: ORAL_TABLET | ORAL | 2 refills | Status: DC

## 2020-12-17 DIAGNOSIS — C50412 Malignant neoplasm of upper-outer quadrant of left female breast: Secondary | ICD-10-CM | POA: Diagnosis not present

## 2020-12-17 DIAGNOSIS — Z170 Estrogen receptor positive status [ER+]: Secondary | ICD-10-CM | POA: Diagnosis not present

## 2020-12-24 ENCOUNTER — Ambulatory Visit: Payer: Medicare HMO | Admitting: Nurse Practitioner

## 2020-12-24 ENCOUNTER — Other Ambulatory Visit: Payer: Medicare HMO

## 2020-12-25 ENCOUNTER — Inpatient Hospital Stay: Payer: Medicare HMO

## 2020-12-25 ENCOUNTER — Inpatient Hospital Stay: Payer: Medicare HMO | Attending: Oncology | Admitting: Oncology

## 2020-12-25 ENCOUNTER — Other Ambulatory Visit: Payer: Self-pay | Admitting: General Surgery

## 2020-12-25 DIAGNOSIS — Z9889 Other specified postprocedural states: Secondary | ICD-10-CM

## 2020-12-25 DIAGNOSIS — I2510 Atherosclerotic heart disease of native coronary artery without angina pectoris: Secondary | ICD-10-CM | POA: Insufficient documentation

## 2020-12-25 DIAGNOSIS — C9201 Acute myeloblastic leukemia, in remission: Secondary | ICD-10-CM | POA: Insufficient documentation

## 2020-12-25 DIAGNOSIS — Z170 Estrogen receptor positive status [ER+]: Secondary | ICD-10-CM | POA: Insufficient documentation

## 2020-12-25 DIAGNOSIS — C50912 Malignant neoplasm of unspecified site of left female breast: Secondary | ICD-10-CM | POA: Insufficient documentation

## 2020-12-25 DIAGNOSIS — Z79811 Long term (current) use of aromatase inhibitors: Secondary | ICD-10-CM | POA: Insufficient documentation

## 2020-12-25 DIAGNOSIS — E119 Type 2 diabetes mellitus without complications: Secondary | ICD-10-CM | POA: Insufficient documentation

## 2020-12-25 DIAGNOSIS — I4891 Unspecified atrial fibrillation: Secondary | ICD-10-CM | POA: Insufficient documentation

## 2020-12-25 DIAGNOSIS — Z86711 Personal history of pulmonary embolism: Secondary | ICD-10-CM | POA: Insufficient documentation

## 2020-12-25 DIAGNOSIS — Z853 Personal history of malignant neoplasm of breast: Secondary | ICD-10-CM

## 2020-12-27 ENCOUNTER — Telehealth: Payer: Self-pay | Admitting: Oncology

## 2020-12-27 NOTE — Telephone Encounter (Signed)
Left message with rescheduled follow-up appointment per 1/20 schedule message. Gave option to call back to reschedule if needed.

## 2020-12-31 ENCOUNTER — Other Ambulatory Visit: Payer: Medicare HMO

## 2020-12-31 ENCOUNTER — Ambulatory Visit: Payer: Medicare HMO | Admitting: Oncology

## 2021-01-01 ENCOUNTER — Inpatient Hospital Stay: Payer: Medicare HMO

## 2021-01-01 ENCOUNTER — Encounter: Payer: Self-pay | Admitting: Nurse Practitioner

## 2021-01-01 ENCOUNTER — Telehealth: Payer: Self-pay | Admitting: Nurse Practitioner

## 2021-01-01 ENCOUNTER — Other Ambulatory Visit: Payer: Self-pay

## 2021-01-01 ENCOUNTER — Inpatient Hospital Stay: Payer: Medicare HMO | Admitting: Nurse Practitioner

## 2021-01-01 VITALS — BP 150/82 | HR 65 | Temp 97.4°F | Ht 62.0 in | Wt 158.1 lb

## 2021-01-01 DIAGNOSIS — Z79811 Long term (current) use of aromatase inhibitors: Secondary | ICD-10-CM | POA: Diagnosis not present

## 2021-01-01 DIAGNOSIS — C50412 Malignant neoplasm of upper-outer quadrant of left female breast: Secondary | ICD-10-CM | POA: Diagnosis not present

## 2021-01-01 DIAGNOSIS — C50912 Malignant neoplasm of unspecified site of left female breast: Secondary | ICD-10-CM | POA: Diagnosis not present

## 2021-01-01 DIAGNOSIS — I4891 Unspecified atrial fibrillation: Secondary | ICD-10-CM | POA: Diagnosis not present

## 2021-01-01 DIAGNOSIS — C9201 Acute myeloblastic leukemia, in remission: Secondary | ICD-10-CM

## 2021-01-01 DIAGNOSIS — Z170 Estrogen receptor positive status [ER+]: Secondary | ICD-10-CM | POA: Diagnosis not present

## 2021-01-01 DIAGNOSIS — Z86711 Personal history of pulmonary embolism: Secondary | ICD-10-CM | POA: Diagnosis not present

## 2021-01-01 DIAGNOSIS — E119 Type 2 diabetes mellitus without complications: Secondary | ICD-10-CM | POA: Diagnosis not present

## 2021-01-01 DIAGNOSIS — Z95828 Presence of other vascular implants and grafts: Secondary | ICD-10-CM

## 2021-01-01 DIAGNOSIS — I2510 Atherosclerotic heart disease of native coronary artery without angina pectoris: Secondary | ICD-10-CM | POA: Diagnosis not present

## 2021-01-01 LAB — CBC WITH DIFFERENTIAL (CANCER CENTER ONLY)
Abs Immature Granulocytes: 0.01 10*3/uL (ref 0.00–0.07)
Basophils Absolute: 0 10*3/uL (ref 0.0–0.1)
Basophils Relative: 0 %
Eosinophils Absolute: 0.2 10*3/uL (ref 0.0–0.5)
Eosinophils Relative: 3 %
HCT: 32.3 % — ABNORMAL LOW (ref 36.0–46.0)
Hemoglobin: 11.3 g/dL — ABNORMAL LOW (ref 12.0–15.0)
Immature Granulocytes: 0 %
Lymphocytes Relative: 26 %
Lymphs Abs: 1.4 10*3/uL (ref 0.7–4.0)
MCH: 30.5 pg (ref 26.0–34.0)
MCHC: 35 g/dL (ref 30.0–36.0)
MCV: 87.3 fL (ref 80.0–100.0)
Monocytes Absolute: 0.5 10*3/uL (ref 0.1–1.0)
Monocytes Relative: 9 %
Neutro Abs: 3.3 10*3/uL (ref 1.7–7.7)
Neutrophils Relative %: 62 %
Platelet Count: 142 10*3/uL — ABNORMAL LOW (ref 150–400)
RBC: 3.7 MIL/uL — ABNORMAL LOW (ref 3.87–5.11)
RDW: 12.1 % (ref 11.5–15.5)
WBC Count: 5.4 10*3/uL (ref 4.0–10.5)
nRBC: 0 % (ref 0.0–0.2)

## 2021-01-01 LAB — CMP (CANCER CENTER ONLY)
ALT: 17 U/L (ref 0–44)
AST: 17 U/L (ref 15–41)
Albumin: 3 g/dL — ABNORMAL LOW (ref 3.5–5.0)
Alkaline Phosphatase: 128 U/L — ABNORMAL HIGH (ref 38–126)
Anion gap: 8 (ref 5–15)
BUN: 25 mg/dL — ABNORMAL HIGH (ref 8–23)
CO2: 25 mmol/L (ref 22–32)
Calcium: 8.3 mg/dL — ABNORMAL LOW (ref 8.9–10.3)
Chloride: 101 mmol/L (ref 98–111)
Creatinine: 1.62 mg/dL — ABNORMAL HIGH (ref 0.44–1.00)
GFR, Estimated: 32 mL/min — ABNORMAL LOW (ref >60)
Glucose, Bld: 394 mg/dL — ABNORMAL HIGH (ref 70–99)
Potassium: 4.1 mmol/L (ref 3.5–5.1)
Sodium: 134 mmol/L — ABNORMAL LOW (ref 135–145)
Total Bilirubin: 0.7 mg/dL (ref 0.3–1.2)
Total Protein: 6.1 g/dL — ABNORMAL LOW (ref 6.5–8.1)

## 2021-01-01 MED ORDER — HEPARIN SOD (PORK) LOCK FLUSH 100 UNIT/ML IV SOLN
500.0000 [IU] | Freq: Once | INTRAVENOUS | Status: DC | PRN
  Filled 2021-01-01: qty 5

## 2021-01-01 MED ORDER — SODIUM CHLORIDE 0.9% FLUSH
10.0000 mL | INTRAVENOUS | Status: DC | PRN
  Administered 2021-01-01: 10 mL via INTRAVENOUS
  Filled 2021-01-01: qty 10

## 2021-01-01 NOTE — Telephone Encounter (Signed)
Scheduled appointments per 1/25 los. Spoke to patient who is aware of appointments date and times.

## 2021-01-01 NOTE — Progress Notes (Signed)
  [facility] OFFICE PROGRESS NOTE   Diagnosis: AML, breast cancer  INTERVAL HISTORY:   [name] returns for follow-up.  She continues letrozole.  No hot flashes.  No change in baseline arthralgias.  Back pain is unchanged.  No new areas of pain.  She has a good appetite.  She thinks the weight loss is likely related to improvement in leg edema.  No fever or cough.  Dyspnea is at baseline.  Objective:  Vital signs in last 24 hours:  Blood pressure (!) 150/82, pulse 65, temperature (!) 97.4 F (36.3 C), temperature source Tympanic, height _0  (1.575 m), weight 158 lb 1.6 oz (71.7 kg), SpO2 98 %.    HEENT: No thrush or ulcers. Lymphatics: No palpable cervical, supraclavicular or inguinal lymph nodes.  Bilateral axillary fat pad.  No discrete adenopathy. Resp: Lungs clear bilaterally. Cardio: Regular rate and rhythm. GI: Abdomen soft and nontender.  No hepatosplenomegaly. Vascular: Very minimal bilateral pretibial edema right slightly greater than left. Port-A-Cath without erythema.   Lab Results:  Lab Results  Component Value Date   WBC 5.4 01/01/2021   HGB 11.3 (L) 01/01/2021   HCT 32.3 (L) 01/01/2021   MCV 87.3 01/01/2021   PLT 142 (L) 01/01/2021   NEUTROABS 3.3 01/01/2021    Imaging:  No results found.  Medications: I have reviewed the patient's current medications.  Assessment/Plan: 1. 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  Status post cycle 3 high-dose araC consolidation 08/22/2014.  Bone marrow 09/15/2014 showed necrotic marrow with minute focus of myeloid precursors. No evidence of viable acute  leukemia. 2.History of pancytopenia secondary to chemotherapy.  3. C. difficile colitis-06/09/2014 treated with Flagyl  4. Diabetes  5. renal insufficiency  6. history of hypokalemia and hypomagnesemia. 7. history of atrial fibrillation  9. history of coronary artery disease  10. Hospitalization with febrile neutropenia/sepsis secondary to Escherichia coli bacteremia 09/02/2014. 11. Shingles involving the right back October 2015. 12. Meningioma on brain CT 05/24/2014 and brain MRI 05/24/2014 13. Hospitalization09/17/2018 through 08/28/2017 with acute CHF. 14.Admission 11/21/2017 with expressive aphasia, seizure activity noted on EEG, placed on Keppra 15. Left breast cancer, T1NX, status post a left lumpectomy 1116 2020-1.5 cm, grade 2, associated with high-grade DCIS with necrosis, ER 95%, PR 95%, HER-2 negative, KI-6710%  Letrozole 11/02/2019 16.  ER 05/08/2020-dyspnea  CT chest 05/08/2020-negative for pulmonary embolism, new patchy sclerosis throughout the thoracic skeleton concerning for bone metastases  MRI thoracic spine 06/08/2020-consistent with successfully treated marrow replacement process in 2015, current area of concern at T9 could represent a benign vascular lesion    Disposition: [name] remains in clinical remission from breast cancer.  She will continue letrozole.  No change in chronic back pain.  We reviewed the CBC from today.  She has mild anemia, improved as compared to 6 weeks ago.  She remains in remission from AML.  Port-A-Cath flushed today.  She will return for lab, port flush, follow-up in 6 to 8 weeks.  We are available to see her sooner if needed.    Ned Card ANP/GNP-BC   01/01/2021  9:51 AM

## 2021-01-07 ENCOUNTER — Other Ambulatory Visit: Payer: Self-pay | Admitting: Internal Medicine

## 2021-01-07 DIAGNOSIS — I2510 Atherosclerotic heart disease of native coronary artery without angina pectoris: Secondary | ICD-10-CM

## 2021-01-07 DIAGNOSIS — I10 Essential (primary) hypertension: Secondary | ICD-10-CM

## 2021-01-22 ENCOUNTER — Other Ambulatory Visit: Payer: Self-pay | Admitting: Internal Medicine

## 2021-01-22 DIAGNOSIS — I10 Essential (primary) hypertension: Secondary | ICD-10-CM

## 2021-01-22 DIAGNOSIS — I2510 Atherosclerotic heart disease of native coronary artery without angina pectoris: Secondary | ICD-10-CM

## 2021-01-22 DIAGNOSIS — E785 Hyperlipidemia, unspecified: Secondary | ICD-10-CM

## 2021-01-22 NOTE — Telephone Encounter (Signed)
Pt call and stated she is out of potassium chloride (KLOR-CON) 10 MEQ tablet and need a refill sent to  [facility], [place] Phone:  [phone]  Fax:  [phone]

## 2021-01-23 MED ORDER — POTASSIUM CHLORIDE CRYS ER 10 MEQ PO TBCR: 10.0000 meq | EXTENDED_RELEASE_TABLET | Freq: Every day | ORAL | 1 refills | Status: DC

## 2021-02-13 ENCOUNTER — Ambulatory Visit
Admission: RE | Admit: 2021-02-13 | Discharge: 2021-02-13 | Disposition: A | Discharge status: Home / Self Care | Payer: Medicare HMO | Source: Ambulatory Visit | Attending: General Surgery | Admitting: General Surgery

## 2021-02-13 ENCOUNTER — Other Ambulatory Visit: Payer: Self-pay

## 2021-02-13 DIAGNOSIS — Z9889 Other specified postprocedural states: Secondary | ICD-10-CM

## 2021-02-13 DIAGNOSIS — R922 Inconclusive mammogram: Secondary | ICD-10-CM | POA: Diagnosis not present

## 2021-02-13 DIAGNOSIS — Z853 Personal history of malignant neoplasm of breast: Secondary | ICD-10-CM | POA: Diagnosis not present

## 2021-02-18 ENCOUNTER — Inpatient Hospital Stay: Payer: Medicare HMO | Admitting: Oncology

## 2021-02-18 ENCOUNTER — Telehealth: Payer: Self-pay | Admitting: Oncology

## 2021-02-18 ENCOUNTER — Inpatient Hospital Stay: Payer: Medicare HMO | Attending: Oncology

## 2021-02-18 ENCOUNTER — Inpatient Hospital Stay: Payer: Medicare HMO

## 2021-02-18 ENCOUNTER — Other Ambulatory Visit: Payer: Self-pay

## 2021-02-18 VITALS — BP 156/54 | HR 72 | Temp 97.8°F | Resp 18 | Ht 62.0 in | Wt 161.4 lb

## 2021-02-18 DIAGNOSIS — Z86711 Personal history of pulmonary embolism: Secondary | ICD-10-CM | POA: Diagnosis not present

## 2021-02-18 DIAGNOSIS — I4891 Unspecified atrial fibrillation: Secondary | ICD-10-CM | POA: Insufficient documentation

## 2021-02-18 DIAGNOSIS — I509 Heart failure, unspecified: Secondary | ICD-10-CM | POA: Insufficient documentation

## 2021-02-18 DIAGNOSIS — E119 Type 2 diabetes mellitus without complications: Secondary | ICD-10-CM | POA: Diagnosis not present

## 2021-02-18 DIAGNOSIS — Z95828 Presence of other vascular implants and grafts: Secondary | ICD-10-CM

## 2021-02-18 DIAGNOSIS — N289 Disorder of kidney and ureter, unspecified: Secondary | ICD-10-CM | POA: Diagnosis not present

## 2021-02-18 DIAGNOSIS — C50412 Malignant neoplasm of upper-outer quadrant of left female breast: Secondary | ICD-10-CM

## 2021-02-18 DIAGNOSIS — Z79899 Other long term (current) drug therapy: Secondary | ICD-10-CM | POA: Diagnosis not present

## 2021-02-18 DIAGNOSIS — C9201 Acute myeloblastic leukemia, in remission: Secondary | ICD-10-CM | POA: Diagnosis not present

## 2021-02-18 DIAGNOSIS — Z170 Estrogen receptor positive status [ER+]: Secondary | ICD-10-CM | POA: Insufficient documentation

## 2021-02-18 DIAGNOSIS — C50912 Malignant neoplasm of unspecified site of left female breast: Secondary | ICD-10-CM | POA: Insufficient documentation

## 2021-02-18 LAB — CMP (CANCER CENTER ONLY)
ALT: 17 U/L (ref 0–44)
AST: 17 U/L (ref 15–41)
Albumin: 2.9 g/dL — ABNORMAL LOW (ref 3.5–5.0)
Alkaline Phosphatase: 139 U/L — ABNORMAL HIGH (ref 38–126)
Anion gap: 10 (ref 5–15)
BUN: 17 mg/dL (ref 8–23)
CO2: 21 mmol/L — ABNORMAL LOW (ref 22–32)
Calcium: 8.1 mg/dL — ABNORMAL LOW (ref 8.9–10.3)
Chloride: 108 mmol/L (ref 98–111)
Creatinine: 1.52 mg/dL — ABNORMAL HIGH (ref 0.44–1.00)
GFR, Estimated: 35 mL/min — ABNORMAL LOW (ref >60)
Glucose, Bld: 310 mg/dL — ABNORMAL HIGH (ref 70–99)
Potassium: 3.3 mmol/L — ABNORMAL LOW (ref 3.5–5.1)
Sodium: 139 mmol/L (ref 135–145)
Total Bilirubin: 0.7 mg/dL (ref 0.3–1.2)
Total Protein: 5.8 g/dL — ABNORMAL LOW (ref 6.5–8.1)

## 2021-02-18 LAB — CBC WITH DIFFERENTIAL (CANCER CENTER ONLY)
Abs Immature Granulocytes: 0.01 10*3/uL (ref 0.00–0.07)
Basophils Absolute: 0 10*3/uL (ref 0.0–0.1)
Basophils Relative: 0 %
Eosinophils Absolute: 0.1 10*3/uL (ref 0.0–0.5)
Eosinophils Relative: 3 %
HCT: 30.5 % — ABNORMAL LOW (ref 36.0–46.0)
Hemoglobin: 10.4 g/dL — ABNORMAL LOW (ref 12.0–15.0)
Immature Granulocytes: 0 %
Lymphocytes Relative: 26 %
Lymphs Abs: 1.4 10*3/uL (ref 0.7–4.0)
MCH: 29.8 pg (ref 26.0–34.0)
MCHC: 34.1 g/dL (ref 30.0–36.0)
MCV: 87.4 fL (ref 80.0–100.0)
Monocytes Absolute: 0.5 10*3/uL (ref 0.1–1.0)
Monocytes Relative: 8 %
Neutro Abs: 3.4 10*3/uL (ref 1.7–7.7)
Neutrophils Relative %: 63 %
Platelet Count: 160 10*3/uL (ref 150–400)
RBC: 3.49 MIL/uL — ABNORMAL LOW (ref 3.87–5.11)
RDW: 13 % (ref 11.5–15.5)
WBC Count: 5.5 10*3/uL (ref 4.0–10.5)
nRBC: 0 % (ref 0.0–0.2)

## 2021-02-18 MED ORDER — SODIUM CHLORIDE 0.9 % IJ SOLN
10.0000 mL | INTRAMUSCULAR | Status: DC | PRN
  Filled 2021-02-18: qty 10

## 2021-02-18 MED ORDER — SODIUM CHLORIDE 0.9% FLUSH
10.0000 mL | Freq: Once | INTRAVENOUS | Status: AC
  Administered 2021-02-18: 10 mL via INTRAVENOUS
  Filled 2021-02-18: qty 10

## 2021-02-18 NOTE — Telephone Encounter (Signed)
Scheduled per los. Gave avs and calendar  

## 2021-02-18 NOTE — Progress Notes (Signed)
[facility] OFFICE PROGRESS NOTE   Diagnosis: Breast cancer, AML  INTERVAL HISTORY:   [name] returns for a scheduled visit.  She continues letrozole.  No hot flashes.  No change over the breasts.  No recent infection.  Bottle mammogram 02/13/2021 revealed postsurgical changes in the left breast.  No evidence of malignancy.  Objective:  Vital signs in last 24 hours:  Blood pressure (!) 156/54, pulse 72, temperature 97.8 F (36.6 C), resp. rate 18, height $RemoveBe'5\' 2"'aczvWNxex$  (1.575 m), weight 161 lb 6.4 oz (73.2 kg), SpO2 99 %.      Lymphatics: No axillary nodes Resp: Lungs clear bilaterally Cardio: Regular rate and rhythm GI: No hepatosplenomegaly Vascular: No leg edema Breast: Status post left lumpectomy.  No evidence for local tumor recurrence.  Portacath/PICC-without erythema  Lab Results:  Lab Results  Component Value Date   WBC 5.5 02/18/2021   HGB 10.4 (L) 02/18/2021   HCT 30.5 (L) 02/18/2021   MCV 87.4 02/18/2021   PLT 160 02/18/2021   NEUTROABS 3.4 02/18/2021    CMP  Lab Results  Component Value Date   NA 134 (L) 01/01/2021   K 4.1 01/01/2021   CL 101 01/01/2021   CO2 25 01/01/2021   GLUCOSE 394 (H) 01/01/2021   BUN 25 (H) 01/01/2021   CREATININE 1.62 (H) 01/01/2021   CALCIUM 8.3 (L) 01/01/2021   PROT 6.1 (L) 01/01/2021   ALBUMIN 3.0 (L) 01/01/2021   AST 17 01/01/2021   ALT 17 01/01/2021   ALKPHOS 128 (H) 01/01/2021   BILITOT 0.7 01/01/2021   GFRNONAA 32 (L) 01/01/2021   GFRAA 39 (L) 10/04/2020     Medications: I have reviewed the patient's current medications.   Assessment/Plan: 1. Acute myelogenous leukemia, monocytic differentiation diagnosed in March of 2015   Induction cytarabine/daunorubicin (7+3) followed by reinduction with cytarabine/daunorubicin and zinecard (5+2) with a recovering bone marrow 05/16/2014 consistent with remission   Status post cycle 1 high-dose araC consolidation 05/30/2014   Status post cycle 2 high-dose  araC consolidation 07/11/2014.  Status post cycle 3 high-dose araC consolidation 08/22/2014.  Bone marrow 09/15/2014 showed necrotic marrow with minute focus of myeloid precursors. No evidence of viable acute leukemia. 2.History of pancytopenia secondary to chemotherapy.  3. C. difficile colitis-06/09/2014 treated with Flagyl  4. Diabetes  5. renal insufficiency  6. history of hypokalemia and hypomagnesemia. 7. history of atrial fibrillation  9. history of coronary artery disease  10. Hospitalization with febrile neutropenia/sepsis secondary to Escherichia coli bacteremia 09/02/2014. 11. Shingles involving the right back October 2015. 12. Meningioma on brain CT 05/24/2014 and brain MRI 05/24/2014 13. Hospitalization09/17/2018 through 08/28/2017 with acute CHF. 14.Admission 11/21/2017 with expressive aphasia, seizure activity noted on EEG, placed on Keppra 15. Left breast cancer, T1NX, status post a left lumpectomy 10/24/2019-1.5 cm, grade 2, associated with high-grade DCIS with necrosis, ER 95%, PR 95%, HER-2 negative, KI-6710%  Letrozole 11/02/2019 16.  ER 05/08/2020-dyspnea  CT chest 05/08/2020-negative for pulmonary embolism, new patchy sclerosis throughout the thoracic skeleton concerning for bone metastases  MRI thoracic spine 06/08/2020-consistent with successfully treated marrow replacement process in 2015, current area of concern at T9 could represent a benign vascular lesion   Disposition: [name] remains in clinical remission from AML.  She has chronic mild anemia, likely related to the treatment course for AML and renal insufficiency.  She is scheduled to see Dr. Florene Glen next month.  Ms. Reach continues adjuvant letrozole for treatment of left-sided breast cancer.  She will return for  a Port-A-Cath flush 05/03/2021.  She will be scheduled for an office visit and CBC on 05/29/2021.  Betsy Coder, MD  02/18/2021  9:43 AM

## 2021-02-20 ENCOUNTER — Telehealth (INDEPENDENT_AMBULATORY_CARE_PROVIDER_SITE_OTHER): Payer: Medicare HMO | Admitting: Internal Medicine

## 2021-02-20 ENCOUNTER — Other Ambulatory Visit: Payer: Self-pay

## 2021-02-20 VITALS — Wt 161.0 lb

## 2021-02-20 DIAGNOSIS — N1831 Chronic kidney disease, stage 3a: Secondary | ICD-10-CM

## 2021-02-20 DIAGNOSIS — E1122 Type 2 diabetes mellitus with diabetic chronic kidney disease: Secondary | ICD-10-CM | POA: Diagnosis not present

## 2021-02-20 DIAGNOSIS — G8929 Other chronic pain: Secondary | ICD-10-CM

## 2021-02-20 DIAGNOSIS — Z794 Long term (current) use of insulin: Secondary | ICD-10-CM

## 2021-02-20 DIAGNOSIS — M542 Cervicalgia: Secondary | ICD-10-CM

## 2021-02-20 DIAGNOSIS — F419 Anxiety disorder, unspecified: Secondary | ICD-10-CM | POA: Diagnosis not present

## 2021-02-20 DIAGNOSIS — M549 Dorsalgia, unspecified: Secondary | ICD-10-CM

## 2021-02-20 MED ORDER — HYDROCODONE-ACETAMINOPHEN 5-325 MG PO TABS: 1.0000 | ORAL_TABLET | Freq: Four times a day (QID) | ORAL | 0 refills | Status: DC | PRN

## 2021-02-20 MED ORDER — HYDROCODONE-ACETAMINOPHEN 5-325 MG PO TABS
1.0000 | ORAL_TABLET | Freq: Four times a day (QID) | ORAL | 0 refills | Status: DC | PRN
Start: 2021-02-20 — End: 2021-05-14

## 2021-02-20 MED ORDER — LORAZEPAM 0.5 MG PO TABS: ORAL_TABLET | ORAL | 2 refills | Status: DC

## 2021-02-20 NOTE — Progress Notes (Signed)
Virtual Visit via Telephone Note  I connected with [name] on 02/20/21 at  3:30 PM EDT by telephone and verified that I am speaking with the correct person using two identifiers.   I discussed the limitations, risks, security and privacy concerns of performing an evaluation and management service by telephone and the availability of in person appointments. I also discussed with the patient that there may be a patient responsible charge related to this service. The patient expressed understanding and agreed to proceed.  Location patient: home Location provider: work office Participants present for the call: patient, provider Patient did not have a visit in the prior 7 days to address this/these issue(s).   History of Present Illness:  She has scheduled this visit for medication refills.  She has a history of breast cancer and chronic pain syndrome due to chronic back pain.  She has been on hydrocodone 5/325 every 6 hours for years.  She also needs refills of her lorazepam that she takes 3 times a day.   Observations/Objective: Patient sounds cheerful and well on the phone. I do not appreciate any increased work of breathing. Speech and thought processing are grossly intact. Patient reported vitals: None reported   Current Outpatient Medications:  .  amLODipine (NORVASC) 10 MG tablet, TAKE 1 TABLET EVERY DAY, Disp: 90 tablet, Rfl: 1 .  aspirin EC 81 MG tablet, Take 1 tablet (81 mg total) by mouth daily., Disp: , Rfl:  .  atorvastatin (LIPITOR) 40 MG tablet, TAKE 1 TABLET EVERY DAY, Disp: 90 tablet, Rfl: 1 .  Blood Glucose Monitoring Suppl (TRUE METRIX GO GLUCOSE METER) w/Device KIT, 30 Units by Does not apply route daily., Disp: 1 kit, Rfl: 0 .  Calcium Carbonate-Vit D-Min (CALTRATE 600+D PLUS MINERALS) 600-800 MG-UNIT TABS, Take 1 tablet by mouth daily., Disp: , Rfl:  .  clopidogrel (PLAVIX) 75 MG tablet, TAKE 1 TABLET (75 MG TOTAL) BY MOUTH EVERY EVENING., Disp: 90 tablet,  Rfl: 1 .  cyanocobalamin (,VITAMIN B-12,) 1000 MCG/ML injection, ADMINISTER 1 ML(1000 MCG) IN THE MUSCLE every 30 days, Disp: 10 mL, Rfl: 10 .  fluticasone furoate-vilanterol (BREO ELLIPTA) 200-25 MCG/INH AEPB, Inhale 1 puff into the lungs daily., Disp: 120 each, Rfl: 5 .  glucose blood (TRUE METRIX BLOOD GLUCOSE TEST) test strip, TEST BLOOD GLUCOSE THREE TIMES DAILY, Disp: 300 strip, Rfl: 2 .  insulin aspart (NOVOLOG) 100 UNIT/ML injection, Inject 14 Units into the skin 3 (three) times daily before meals., Disp: 15 mL, Rfl: 9 .  insulin glargine, 2 Unit Dial, (TOUJEO MAX SOLOSTAR) 300 UNIT/ML Solostar Pen, Inject 16 Units into the skin at bedtime., Disp: 15 mL, Rfl: 11 .  Insulin Pen Needle 32G X 4 MM MISC, 1 Device by Does not apply route in the morning, at noon, in the evening, and at bedtime., Disp: 400 each, Rfl: 3 .  isosorbide mononitrate (IMDUR) 30 MG 24 hr tablet, TAKE 1 AND 1/2 TABLETS EVERY DAY, Disp: 135 tablet, Rfl: 1 .  letrozole (FEMARA) 2.5 MG tablet, TAKE 1 TABLET (2.5 MG TOTAL) BY MOUTH DAILY., Disp: 90 tablet, Rfl: 3 .  MAGNESIUM OXIDE 400 PO, Take 400 mg by mouth 2 (two) times daily., Disp: , Rfl:  .  metoprolol succinate (TOPROL-XL) 100 MG 24 hr tablet, TAKE 1 TABLET EVERY DAY  WITH  OR  IMMEDIATELY FOLLOWING A MEAL, Disp: 90 tablet, Rfl: 1 .  nitroGLYCERIN (NITROSTAT) 0.4 MG SL tablet, Place 1 tablet (0.4 mg total) under the tongue  every 5 (five) minutes as needed for chest pain., Disp: 24 tablet, Rfl: 1 .  polyethylene glycol (MIRALAX / GLYCOLAX) 17 g packet, as needed., Disp: , Rfl:  .  potassium chloride (KLOR-CON) 10 MEQ tablet, Take 1 tablet (10 mEq total) by mouth daily., Disp: 90 tablet, Rfl: 1 .  promethazine (PHENERGAN) 12.5 MG tablet, Take 2 tablets (25 mg total) by mouth every 6 (six) hours as needed for nausea., Disp: 12 tablet, Rfl: 0 .  SYRINGE-NEEDLE, DISP, 3 ML (BD SAFETYGLIDE SYRINGE/NEEDLE) 25G X 1" 3 ML MISC, Use for B12 injections, Disp: 100 each, Rfl: 11 .   torsemide (DEMADEX) 20 MG tablet, Take 40 mg in the morning and 20 mg in the afternoon, Disp: 270 tablet, Rfl: 3 .  HYDROcodone-acetaminophen (NORCO) 5-325 MG tablet, Take 1 tablet by mouth every 6 (six) hours as needed for moderate pain., Disp: 120 tablet, Rfl: 0 .  HYDROcodone-acetaminophen (NORCO/VICODIN) 5-325 MG tablet, Take 1 tablet by mouth every 6 (six) hours as needed for moderate pain., Disp: 120 tablet, Rfl: 0 .  HYDROcodone-acetaminophen (NORCO/VICODIN) 5-325 MG tablet, Take 1 tablet by mouth every 6 (six) hours as needed for moderate pain., Disp: 120 tablet, Rfl: 0 .  LORazepam (ATIVAN) 0.5 MG tablet, TAKE 1 TABLET BY MOUTH EVERY 8 HOURS AS NEEDED FOR ANXIETY(DO NOT EXCEED MORE THAN 2 TABS IN 24 HRS), Disp: 60 tablet, Rfl: 2 No current facility-administered medications for this visit.  Facility-Administered Medications Ordered in Other Visits:  .  sodium chloride 0.9 % 1,000 mL with magnesium sulfate 2 g infusion, , Intravenous, Continuous, Ladell Pier, MD, Stopped at 08/04/14 1441  Review of Systems:  Constitutional: Denies fever, chills, diaphoresis, appetite change and fatigue.  HEENT: Denies photophobia, eye pain, redness, hearing loss, ear pain, congestion, sore throat, rhinorrhea, sneezing, mouth sores, trouble swallowing, neck pain, neck stiffness and tinnitus.   Respiratory: Denies SOB, DOE, cough, chest tightness,  and wheezing.   Cardiovascular: Denies chest pain, palpitations and leg swelling.  Gastrointestinal: Denies nausea, vomiting, abdominal pain, diarrhea, constipation, blood in stool and abdominal distention.  Genitourinary: Denies dysuria, urgency, frequency, hematuria, flank pain and difficulty urinating.  Endocrine: Denies: hot or cold intolerance, sweats, changes in hair or nails, polyuria, polydipsia. Musculoskeletal: Denies myalgias, back pain, joint swelling, arthralgias and gait problem.  Skin: Denies pallor, rash and wound.  Neurological: Denies  dizziness, seizures, syncope, weakness, light-headedness, numbness and headaches.  Hematological: Denies adenopathy. Easy bruising, personal or family bleeding history  Psychiatric/Behavioral: Denies suicidal ideation, mood changes, confusion, nervousness, sleep disturbance and agitation   Assessment and Plan:  Chronic bilateral back pain, unspecified back location  -PDMP reviewed, no red flags, overdose risk score is 70. -Refill hydrocodone 5/325 mg to take every 6 hours as needed for pain for total of 120 tablets a month x3 months. -When she next comes in office she needs to sign a new pain contract as hers has not been updated since 2019.  Anxiety  - Plan: LORazepam (ATIVAN) 0.5 MG tablet   Type 2 diabetes mellitus with stage 3a chronic kidney disease, with long-term current use of insulin ([place])  - Plan: Ambulatory referral to Endocrinology given labile diabetes with above goal A1c.    I discussed the assessment and treatment plan with the patient. The patient was provided an opportunity to ask questions and all were answered. The patient agreed with the plan and demonstrated an understanding of the instructions.   The patient was advised to call back or seek an  in-person evaluation if the symptoms worsen or if the condition fails to improve as anticipated.  I provided 15 minutes of non-face-to-face time during this encounter.   Lelon Frohlich, MD [facility]

## 2021-03-08 ENCOUNTER — Telehealth: Payer: Self-pay | Admitting: Internal Medicine

## 2021-03-08 DIAGNOSIS — E1165 Type 2 diabetes mellitus with hyperglycemia: Secondary | ICD-10-CM

## 2021-03-08 DIAGNOSIS — Z794 Long term (current) use of insulin: Secondary | ICD-10-CM

## 2021-03-08 MED ORDER — TOUJEO MAX SOLOSTAR 300 UNIT/ML ~~LOC~~ SOPN: 16.0000 [IU] | PEN_INJECTOR | Freq: Every day | SUBCUTANEOUS | 11 refills | Status: DC

## 2021-03-08 NOTE — Telephone Encounter (Signed)
Pt is calling in needing a refill on(TOUJEO MAX SOLOSTAR)  300 ML Quik Pen  Pharm:  [facility]  Pt would like to have a call once it has been sent in.

## 2021-03-08 NOTE — Telephone Encounter (Signed)
Refill sent.

## 2021-03-15 DIAGNOSIS — I129 Hypertensive chronic kidney disease with stage 1 through stage 4 chronic kidney disease, or unspecified chronic kidney disease: Secondary | ICD-10-CM | POA: Diagnosis not present

## 2021-03-15 DIAGNOSIS — C50912 Malignant neoplasm of unspecified site of left female breast: Secondary | ICD-10-CM | POA: Diagnosis not present

## 2021-03-15 DIAGNOSIS — E1122 Type 2 diabetes mellitus with diabetic chronic kidney disease: Secondary | ICD-10-CM | POA: Diagnosis not present

## 2021-03-15 DIAGNOSIS — I480 Paroxysmal atrial fibrillation: Secondary | ICD-10-CM | POA: Diagnosis not present

## 2021-03-15 DIAGNOSIS — K5900 Constipation, unspecified: Secondary | ICD-10-CM | POA: Diagnosis not present

## 2021-03-15 DIAGNOSIS — M545 Low back pain, unspecified: Secondary | ICD-10-CM | POA: Diagnosis not present

## 2021-03-15 DIAGNOSIS — C9201 Acute myeloblastic leukemia, in remission: Secondary | ICD-10-CM | POA: Diagnosis not present

## 2021-03-15 DIAGNOSIS — I10 Essential (primary) hypertension: Secondary | ICD-10-CM | POA: Diagnosis not present

## 2021-03-15 DIAGNOSIS — E538 Deficiency of other specified B group vitamins: Secondary | ICD-10-CM | POA: Diagnosis not present

## 2021-03-15 DIAGNOSIS — J9601 Acute respiratory failure with hypoxia: Secondary | ICD-10-CM | POA: Diagnosis not present

## 2021-03-15 DIAGNOSIS — J90 Pleural effusion, not elsewhere classified: Secondary | ICD-10-CM | POA: Diagnosis not present

## 2021-03-15 DIAGNOSIS — J9691 Respiratory failure, unspecified with hypoxia: Secondary | ICD-10-CM | POA: Diagnosis not present

## 2021-03-15 DIAGNOSIS — N189 Chronic kidney disease, unspecified: Secondary | ICD-10-CM | POA: Diagnosis not present

## 2021-04-10 ENCOUNTER — Ambulatory Visit: Payer: Medicare HMO | Admitting: Internal Medicine

## 2021-04-10 ENCOUNTER — Encounter: Payer: Self-pay | Admitting: Internal Medicine

## 2021-04-10 ENCOUNTER — Other Ambulatory Visit: Payer: Self-pay

## 2021-04-10 VITALS — BP 124/64 | HR 60 | Ht 62.0 in | Wt 160.0 lb

## 2021-04-10 DIAGNOSIS — E1122 Type 2 diabetes mellitus with diabetic chronic kidney disease: Secondary | ICD-10-CM | POA: Diagnosis not present

## 2021-04-10 DIAGNOSIS — N1832 Chronic kidney disease, stage 3b: Secondary | ICD-10-CM

## 2021-04-10 DIAGNOSIS — E1159 Type 2 diabetes mellitus with other circulatory complications: Secondary | ICD-10-CM

## 2021-04-10 DIAGNOSIS — Z794 Long term (current) use of insulin: Secondary | ICD-10-CM | POA: Diagnosis not present

## 2021-04-10 DIAGNOSIS — E1165 Type 2 diabetes mellitus with hyperglycemia: Secondary | ICD-10-CM | POA: Diagnosis not present

## 2021-04-10 DIAGNOSIS — N183 Chronic kidney disease, stage 3 unspecified: Secondary | ICD-10-CM | POA: Diagnosis not present

## 2021-04-10 DIAGNOSIS — J449 Chronic obstructive pulmonary disease, unspecified: Secondary | ICD-10-CM | POA: Diagnosis not present

## 2021-04-10 LAB — POCT GLYCOSYLATED HEMOGLOBIN (HGB A1C): Hemoglobin A1C: 11.2 % — AB (ref 4.0–5.6)

## 2021-04-10 LAB — POCT GLUCOSE (DEVICE FOR HOME USE): POC Glucose: 126 mg/dl — AB (ref 70–99)

## 2021-04-10 MED ORDER — DEXCOM G6 SENSOR MISC: 1.0000 | 3 refills | Status: DC

## 2021-04-10 MED ORDER — DEXCOM G6 TRANSMITTER MISC: 1.0000 | 3 refills | Status: DC

## 2021-04-10 MED ORDER — TOUJEO MAX SOLOSTAR 300 UNIT/ML ~~LOC~~ SOPN: 16.0000 [IU] | PEN_INJECTOR | Freq: Every day | SUBCUTANEOUS | 1 refills | Status: DC

## 2021-04-10 MED ORDER — INSULIN PEN NEEDLE 32G X 4 MM MISC: 1.0000 | Freq: Four times a day (QID) | 3 refills | Status: DC

## 2021-04-10 MED ORDER — NOVOLOG FLEXPEN 100 UNIT/ML ~~LOC~~ SOPN: PEN_INJECTOR | SUBCUTANEOUS | 3 refills | Status: DC

## 2021-04-10 NOTE — Patient Instructions (Signed)
-   Continue Toujeo 16  units daily  - Decrease  Novolog 10 units with each meal   Novolog correctional insulin: ADD extra units on insulin to your meal-time Novolog dose if your blood sugars are higher than 165. Use the scale below to help guide you:   Blood sugar before meal Number of units to inject  Less than 165 0 unit  166 -  200 1 units  201 -  235 2 units  236 -  270 3 units  271 -  305 4 units  306 -  340 5 units  341 -  375 6 units  376 -  410 7 units  411 -  445 8 units          HOW TO TREAT LOW BLOOD SUGARS (Blood sugar LESS THAN 70 MG/DL)  Please follow the RULE OF 15 for the treatment of hypoglycemia treatment (when your (blood sugars are less than 70 mg/dL)    STEP 1: Take 15 grams of carbohydrates when your blood sugar is low, which includes:   3-4 GLUCOSE TABS  OR  3-4 OZ OF JUICE OR REGULAR SODA OR  ONE TUBE OF GLUCOSE GEL     STEP 2: RECHECK blood sugar in 15 MINUTES STEP 3: If your blood sugar is still low at the 15 minute recheck --> then, go back to STEP 1 and treat AGAIN with another 15 grams of carbohydrates.

## 2021-04-10 NOTE — Progress Notes (Signed)
Name: [name]  Age/ Sex: 80 y.o., female   MRN/ DOB: [id number], 1940/12/25     PCP: Isaac Bliss, Rayford Halsted, MD   Reason for Endocrinology Evaluation: Type 2 Diabetes Mellitus  Initial Endocrine Consultative Visit: 05/04/2019    PATIENT IDENTIFIER: [name] is a 80 y.o. female with a past medical history of T2DM, HTN, CAD, CHF , Hx of pancreatitis. The patient has followed with Endocrinology clinic since 05/04/2019 for consultative assistance with management of her diabetes.  DIABETIC HISTORY:  [name] was diagnosed with T2DM in 2008, she was on metformin, SU and pioglitazone has been on and started insulin therapy many years ago. Her hemoglobin A1c has ranged from 6.6% in 2015, peaking at 12.4% in 2018.  She continues to work at an [employer] place.   SUBJECTIVE:   During the last visit (05/02/2020): A1c was 9.6 % , We adjusted MDI regimen      Today (04/10/2021): [name] is here for a follow up on diabetes management. She has not been to our clinic in 1 year.  She checks her blood sugars occasionally . The patient has had hypoglycemic episodes mainly at night       HOME DIABETES REGIMEN:  Toujeo 16 units QHS  Novolog  12 units TID QAC CF: Novolog (BG -140/25)       Glucose LOG: no log     HISTORY:  Past Medical History:  Past Medical History:  Diagnosis Date  . acute myeloblastic leukemia (AML) dx'd 06/2014  . Acute myelogenous leukemia ([place]) 02/2014  . Acute pancreatitis   . Arthritis    "back" (09/17/2017)  . Atrial flutter ([place])   . Cardiomyopathy ([place])   . Chronic kidney disease, stage II (mild)   . Chronic lower back pain   . Colon polyps 04/29/2010   TUBULAR ADENOMA AND A SERRATED ADENOMA  . COPD (chronic obstructive pulmonary disease) ([place])   . CORONARY ARTERY DISEASE 12/24/2007  . DIABETES MELLITUS, TYPE II 07/13/2007  . Family history of cervical cancer   . Family history of esophageal cancer   . Family history of leukemia   .  Family history of lung cancer   . Family history of skin cancer   . History of blood transfusion 2015   "related to leukemia"  . History of kidney stones 12/24/2007  . History of uterine cancer   . HYPERLIPIDEMIA 12/24/2007  . HYPERTENSION 07/13/2007  . NSTEMI (non-ST elevated myocardial infarction) ([place]) 09/17/2017  . Unspecified disease of pancreas   . Uterine cancer [facility])    Past Surgical History:  Past Surgical History:  Procedure Laterality Date  . ABDOMINAL HYSTERECTOMY     "still have my ovaries"  . BREAST BIOPSY Left 09/22/2019  . BREAST LUMPECTOMY Left 10/24/2019  . BREAST LUMPECTOMY WITH RADIOACTIVE SEED LOCALIZATION Left 10/24/2019   Procedure: LEFT BREAST LUMPECTOMY WITH RADIOACTIVE SEED LOCALIZATION;  Surgeon: Jovita Kussmaul, MD;  Location: [place];  Service: General;  Laterality: Left;  . CARDIAC CATHETERIZATION  2002  . CARDIAC CATHETERIZATION  09/17/2017  . CATARACT EXTRACTION W/ INTRAOCULAR LENS  IMPLANT, BILATERAL Bilateral   . COLONOSCOPY W/ BIOPSIES AND POLYPECTOMY  2011  . CORONARY STENT INTERVENTION N/A 09/21/2017   Procedure: CORONARY STENT INTERVENTION;  Surgeon: Troy Sine, MD;  Location: [place] [facility];  Service: Cardiovascular;  Laterality: N/A;  . DILATION AND CURETTAGE OF UTERUS    . EXCISIONAL HEMORRHOIDECTOMY  X 2  . GANGLION CYST  EXCISION Left X 3  . INTRAVASCULAR PRESSURE WIRE/FFR STUDY N/A 09/17/2017   Procedure: INTRAVASCULAR PRESSURE WIRE/FFR STUDY;  Surgeon: Nelva Bush, MD;  Location: [place] [facility];  Service: Cardiovascular;  Laterality: N/A;  . NASAL SINUS SURGERY    . PORTA CATH INSERTION Right 2013  . RIGHT/LEFT HEART CATH AND CORONARY ANGIOGRAPHY N/A 09/17/2017   Procedure: RIGHT/LEFT HEART CATH AND CORONARY ANGIOGRAPHY;  Surgeon: Nelva Bush, MD;  Location: [place] [facility];  Service: Cardiovascular;  Laterality: N/A;  . TUBAL LIGATION     Social History:  reports that she quit smoking about 20  years ago. Her smoking use included cigarettes. She has a 20.00 pack-year smoking history. She has never used smokeless tobacco. She reports that she does not drink alcohol and does not use drugs. Family History:  Family History  Problem Relation Age of Onset  . COPD Father   . Esophageal cancer Father   . Breast cancer Sister 26  . Lung cancer Sister   . Esophageal cancer Brother   . Suicidality Brother   . Lung cancer Sister   . Lung cancer Sister   . Cervical cancer Sister   . Congestive Heart Failure Mother   . Heart attack Maternal Grandmother   . Skin cancer Maternal Grandfather   . Heart attack Maternal Grandfather   . Stroke Paternal Grandmother 40  . Heart attack Paternal Grandfather   . Cancer Maternal Uncle        unknown type, dx. >65  . Cancer Paternal Aunt        unknown type, dx. >50  . Cancer Paternal Uncle        unknown type, dx. >50  . Cirrhosis Maternal Uncle   . Skin cancer Daughter   . Leukemia Cousin        maternal first cousin; dx. >50, in remission  . Cancer Niece        unknown type behind her ear, dx. 30s/40s  . Colon cancer Neg Hx      HOME MEDICATIONS: Allergies as of 04/10/2021   No Known Allergies     Medication List       Accurate as of Apr 10, 2021 12:50 PM. If you have any questions, ask your nurse or doctor.        amLODipine 10 MG tablet Commonly known as: NORVASC TAKE 1 TABLET EVERY DAY   aspirin EC 81 MG tablet Take 1 tablet (81 mg total) by mouth daily.   atorvastatin 40 MG tablet Commonly known as: LIPITOR TAKE 1 TABLET EVERY DAY   BD SafetyGlide Syringe/Needle 25G X 1" 3 ML Misc Generic drug: SYRINGE-NEEDLE (DISP) 3 ML Use for B12 injections   Caltrate 600+D Plus Minerals 600-800 MG-UNIT Tabs Take 1 tablet by mouth daily.   clopidogrel 75 MG tablet Commonly known as: PLAVIX TAKE 1 TABLET (75 MG TOTAL) BY MOUTH EVERY EVENING.   cyanocobalamin 1000 MCG/ML injection Commonly known as: (VITAMIN B-12) ADMINISTER  1 ML(1000 MCG) IN THE MUSCLE every 30 days   fluticasone furoate-vilanterol 200-25 MCG/INH Aepb Commonly known as: Breo Ellipta Inhale 1 puff into the lungs daily.   HYDROcodone-acetaminophen 5-325 MG tablet Commonly known as: NORCO/VICODIN Take 1 tablet by mouth every 6 (six) hours as needed for moderate pain.   HYDROcodone-acetaminophen 5-325 MG tablet Commonly known as: Norco Take 1 tablet by mouth every 6 (six) hours as needed for moderate pain.   HYDROcodone-acetaminophen 5-325 MG tablet Commonly known as: NORCO/VICODIN Take 1 tablet by  mouth every 6 (six) hours as needed for moderate pain.   insulin aspart 100 UNIT/ML injection Commonly known as: novoLOG Inject 14 Units into the skin 3 (three) times daily before meals.   Insulin Pen Needle 32G X 4 MM Misc 1 Device by Does not apply route in the morning, at noon, in the evening, and at bedtime.   isosorbide mononitrate 30 MG 24 hr tablet Commonly known as: IMDUR TAKE 1 AND 1/2 TABLETS EVERY DAY   letrozole 2.5 MG tablet Commonly known as: FEMARA TAKE 1 TABLET (2.5 MG TOTAL) BY MOUTH DAILY.   LORazepam 0.5 MG tablet Commonly known as: ATIVAN TAKE 1 TABLET BY MOUTH EVERY 8 HOURS AS NEEDED FOR ANXIETY(DO NOT EXCEED MORE THAN 2 TABS IN 24 HRS)   MAGNESIUM OXIDE 400 PO Take 400 mg by mouth 2 (two) times daily.   metoprolol succinate 100 MG 24 hr tablet Commonly known as: TOPROL-XL TAKE 1 TABLET EVERY DAY  WITH  OR  IMMEDIATELY FOLLOWING A MEAL   nitroGLYCERIN 0.4 MG SL tablet Commonly known as: NITROSTAT Place 1 tablet (0.4 mg total) under the tongue every 5 (five) minutes as needed for chest pain.   polyethylene glycol 17 g packet Commonly known as: MIRALAX / GLYCOLAX as needed.   potassium chloride 10 MEQ tablet Commonly known as: KLOR-CON Take 1 tablet (10 mEq total) by mouth daily.   promethazine 12.5 MG tablet Commonly known as: PHENERGAN Take 2 tablets (25 mg total) by mouth every 6 (six) hours as  needed for nausea.   torsemide 20 MG tablet Commonly known as: DEMADEX Take 40 mg in the morning and 20 mg in the afternoon   Toujeo Max SoloStar 300 UNIT/ML Solostar Pen Generic drug: insulin glargine (2 Unit Dial) Inject 16 Units into the skin at bedtime.   True Metrix Blood Glucose Test test strip Generic drug: glucose blood TEST BLOOD GLUCOSE THREE TIMES DAILY   True Metrix Go Glucose Meter w/Device Kit 30 Units by Does not apply route daily.        OBJECTIVE:   Vital Signs: There were no vitals taken for this visit.  Wt Readings from Last 3 Encounters:  02/20/21 161 lb (73 kg)  02/18/21 161 lb 6.4 oz (73.2 kg)  01/01/21 158 lb 1.6 oz (71.7 kg)     Exam: General: Pt appears well and is in NAD  Lungs: Clear with good BS bilat with no rales, rhonchi, or wheezes  Heart: RRR with normal S1 and S2 and no gallops; no murmurs; no rub  Abdomen: Normoactive bowel sounds, soft, nontender, without masses or organomegaly palpable  Extremities: 1+  pretibial edema.   Neuro: MS is good with appropriate affect, pt is alert and Ox3       DM foot exam: 05/04/2019 The skin of the feet is intact without sores or ulcerations. The pedal pulses are 2+ on right and 2+ on left. The sensation is intact to a screening 5.07, 10 gram monofilament bilaterally       DATA REVIEWED:  Lab Results  Component Value Date   HGBA1C 8.3 (A) 07/18/2020   HGBA1C 9.6 (A) 04/13/2020   HGBA1C 7.7 (A) 10/26/2019   Lab Results  Component Value Date   MICROALBUR 43.5 (H) 10/27/2018   LDLCALC 45 11/19/2020   CREATININE 1.52 (H) 02/18/2021   Lab Results  Component Value Date   MICRALBCREAT 101.4 (H) 10/27/2018     Lab Results  Component Value Date   CHOL 112 11/19/2020  HDL 49 11/19/2020   LDLCALC 45 11/19/2020   TRIG 94 11/19/2020   CHOLHDL 2.3 11/19/2020   ASSESSMENT / PLAN / RECOMMENDATIONS:   1) Type 2 Diabetes Mellitus, Poorly controlled, With CKD III and Macrovascular  complications - Most recent A1c of 11.3 %. Goal A1c < 7.5 %.     - poorly controlled due to medication non adherence. Her A1c is 11.3 % because she does not take inuslin consistently. She has endorsed hypoglycemia in the past few days and this is because she started taking it .  - Limited glucose data as she does not check consistently and did not bring a meter, I am going to reduce her Novolog based on hypoglycemia after dinner last night  - I will prescribe Dexcom and she will let me know when she gets it to refer her to our CDE  - She was also advised not to take prandial insulin unless she eats, this AM her fasting was 200 and took 12 units of Novolog despite not eating breakfast - Limited glycemic agents due to CKD and hx of pancreatitis    MEDICATIONS: Continue Toujeo to 16 units daily  Decrease  Novolog to 10  units TIDQAC CF: Novolog ( BG - 140/25)   EDUCATION / INSTRUCTIONS: BG monitoring instructions: Patient is instructed to check her blood sugars 3-4 times a day, before meals and bedtime . Call [place] [facility] if: BG persistently < 70  I reviewed the Rule of 15 for the treatment of hypoglycemia in detail with the patient. Literature supplied.    F/U in 4 months    Signed electronically by: Mack Guise, MD  [facility] Endocrinology  [address] [place], [place] [address] Phone: [phone] FAX: [phone]   CC: Isaac Bliss, Rayford Halsted, MD [place] [address] Phone: [phone]  Fax: [phone]  Return to Endocrinology clinic as below: Future Appointments  Date Time Provider [place]  04/10/2021  3:40 PM Zeba Luby, Melanie Crazier, MD [facility] None  05/02/2021  3:00 PM [facility] PHLEBOTOMIST [facility] None  05/02/2021  3:15 PM [facility] FLUSH ROOM [facility] None  06/03/2021  2:15 PM [facility] PHLEBOTOMIST [facility] None  06/03/2021  2:30 PM [facility] FLUSH  ROOM [facility] None  06/03/2021  3:00 PM Ladell Pier, MD [facility] None

## 2021-04-12 MED ORDER — NOVOLOG FLEXPEN 100 UNIT/ML ~~LOC~~ SOPN: PEN_INJECTOR | SUBCUTANEOUS | 3 refills | Status: DC

## 2021-04-12 NOTE — Addendum Note (Signed)
Addended by: Jacqualin Combes on: 04/12/2021 02:54 PM   Modules accepted: Orders

## 2021-04-19 ENCOUNTER — Other Ambulatory Visit: Payer: Self-pay | Admitting: Internal Medicine

## 2021-04-30 ENCOUNTER — Encounter: Payer: Self-pay | Admitting: Internal Medicine

## 2021-04-30 ENCOUNTER — Other Ambulatory Visit: Payer: Self-pay

## 2021-04-30 ENCOUNTER — Ambulatory Visit (INDEPENDENT_AMBULATORY_CARE_PROVIDER_SITE_OTHER): Payer: Medicare HMO | Admitting: Internal Medicine

## 2021-04-30 VITALS — BP 102/60 | HR 64 | Temp 98.1°F | Ht 62.0 in | Wt 161.4 lb

## 2021-04-30 DIAGNOSIS — G8929 Other chronic pain: Secondary | ICD-10-CM

## 2021-04-30 DIAGNOSIS — F419 Anxiety disorder, unspecified: Secondary | ICD-10-CM | POA: Diagnosis not present

## 2021-04-30 DIAGNOSIS — N1832 Chronic kidney disease, stage 3b: Secondary | ICD-10-CM

## 2021-04-30 DIAGNOSIS — E1122 Type 2 diabetes mellitus with diabetic chronic kidney disease: Secondary | ICD-10-CM

## 2021-04-30 DIAGNOSIS — Z794 Long term (current) use of insulin: Secondary | ICD-10-CM

## 2021-04-30 DIAGNOSIS — M549 Dorsalgia, unspecified: Secondary | ICD-10-CM | POA: Diagnosis not present

## 2021-04-30 NOTE — Progress Notes (Signed)
Established Patient Office Visit     This visit occurred during the SARS-CoV-2 public health emergency.  Safety protocols were in place, including screening questions prior to the visit, additional usage of staff PPE, and extensive cleaning of exam room while observing appropriate contact time as indicated for disinfecting solutions.    CC/Reason for Visit: Medication refills  HPI: [name] is a 80 y.o. female who is coming in today for the above mentioned reasons.  She has started seeing endocrinology again for her diabetes at my recommendation due to labile diabetes.  She was recommended a Dexcom monitor, however she cannot afford the $60 price tag.  She will be due for refills of her hydrocodone and lorazepam at the end of the month.  She takes these for chronic back pain and anxiety.  She has been on the same doses for years.  Past Medical/Surgical History: Past Medical History:  Diagnosis Date  . acute myeloblastic leukemia (AML) dx'd 06/2014  . Acute myelogenous leukemia ([place]) 02/2014  . Acute pancreatitis   . Arthritis    "back" (09/17/2017)  . Atrial flutter ([place])   . Cardiomyopathy ([place])   . Chronic kidney disease, stage II (mild)   . Chronic lower back pain   . Colon polyps 04/29/2010   TUBULAR ADENOMA AND A SERRATED ADENOMA  . COPD (chronic obstructive pulmonary disease) ([place])   . CORONARY ARTERY DISEASE 12/24/2007  . DIABETES MELLITUS, TYPE II 07/13/2007  . Family history of cervical cancer   . Family history of esophageal cancer   . Family history of leukemia   . Family history of lung cancer   . Family history of skin cancer   . History of blood transfusion 2015   "related to leukemia"  . History of kidney stones 12/24/2007  . History of uterine cancer   . HYPERLIPIDEMIA 12/24/2007  . HYPERTENSION 07/13/2007  . NSTEMI (non-ST elevated myocardial infarction) ([place]) 09/17/2017  . Unspecified disease of pancreas   . Uterine cancer [facility])     Past Surgical  History:  Procedure Laterality Date  . ABDOMINAL HYSTERECTOMY     "still have my ovaries"  . BREAST BIOPSY Left 09/22/2019  . BREAST LUMPECTOMY Left 10/24/2019  . BREAST LUMPECTOMY WITH RADIOACTIVE SEED LOCALIZATION Left 10/24/2019   Procedure: LEFT BREAST LUMPECTOMY WITH RADIOACTIVE SEED LOCALIZATION;  Surgeon: Jovita Kussmaul, MD;  Location: [place];  Service: General;  Laterality: Left;  . CARDIAC CATHETERIZATION  2002  . CARDIAC CATHETERIZATION  09/17/2017  . CATARACT EXTRACTION W/ INTRAOCULAR LENS  IMPLANT, BILATERAL Bilateral   . COLONOSCOPY W/ BIOPSIES AND POLYPECTOMY  2011  . CORONARY STENT INTERVENTION N/A 09/21/2017   Procedure: CORONARY STENT INTERVENTION;  Surgeon: Troy Sine, MD;  Location: [place] [facility];  Service: Cardiovascular;  Laterality: N/A;  . DILATION AND CURETTAGE OF UTERUS    . EXCISIONAL HEMORRHOIDECTOMY  X 2  . GANGLION CYST EXCISION Left X 3  . INTRAVASCULAR PRESSURE WIRE/FFR STUDY N/A 09/17/2017   Procedure: INTRAVASCULAR PRESSURE WIRE/FFR STUDY;  Surgeon: Nelva Bush, MD;  Location: [place] [facility];  Service: Cardiovascular;  Laterality: N/A;  . NASAL SINUS SURGERY    . PORTA CATH INSERTION Right 2013  . RIGHT/LEFT HEART CATH AND CORONARY ANGIOGRAPHY N/A 09/17/2017   Procedure: RIGHT/LEFT HEART CATH AND CORONARY ANGIOGRAPHY;  Surgeon: Nelva Bush, MD;  Location: [place] [facility];  Service: Cardiovascular;  Laterality: N/A;  . TUBAL LIGATION      Social  History:  reports that she quit smoking about 20 years ago. Her smoking use included cigarettes. She has a 20.00 pack-year smoking history. She has never used smokeless tobacco. She reports that she does not drink alcohol and does not use drugs.  Allergies: No Known Allergies  Family History:  Family History  Problem Relation Age of Onset  . COPD Father   . Esophageal cancer Father   . Breast cancer Sister 51  . Lung cancer Sister   . Esophageal cancer Brother    . Suicidality Brother   . Lung cancer Sister   . Lung cancer Sister   . Cervical cancer Sister   . Congestive Heart Failure Mother   . Heart attack Maternal Grandmother   . Skin cancer Maternal Grandfather   . Heart attack Maternal Grandfather   . Stroke Paternal Grandmother 69  . Heart attack Paternal Grandfather   . Cancer Maternal Uncle        unknown type, dx. >65  . Cancer Paternal Aunt        unknown type, dx. >50  . Cancer Paternal Uncle        unknown type, dx. >50  . Cirrhosis Maternal Uncle   . Skin cancer Daughter   . Leukemia Cousin        maternal first cousin; dx. >50, in remission  . Cancer Niece        unknown type behind her ear, dx. 30s/40s  . Colon cancer Neg Hx      Current Outpatient Medications:  .  amLODipine (NORVASC) 10 MG tablet, TAKE 1 TABLET EVERY DAY, Disp: 90 tablet, Rfl: 1 .  amLODipine (NORVASC) 5 MG tablet, Take 1 tablet by mouth daily., Disp: , Rfl:  .  aspirin 325 MG EC tablet, Take 1 tablet by mouth daily., Disp: , Rfl:  .  aspirin EC 81 MG tablet, Take 1 tablet (81 mg total) by mouth daily., Disp: , Rfl:  .  atorvastatin (LIPITOR) 40 MG tablet, TAKE 1 TABLET EVERY DAY, Disp: 90 tablet, Rfl: 1 .  Calcium Carbonate-Vit D-Min (CALTRATE 600+D PLUS MINERALS) 600-800 MG-UNIT TABS, Take 1 tablet by mouth daily., Disp: , Rfl:  .  clopidogrel (PLAVIX) 75 MG tablet, TAKE 1 TABLET (75 MG TOTAL) BY MOUTH EVERY EVENING., Disp: 90 tablet, Rfl: 1 .  Continuous Blood Gluc Sensor (DEXCOM G6 SENSOR) MISC, 1 Device by Does not apply route as directed., Disp: 9 each, Rfl: 3 .  Continuous Blood Gluc Transmit (DEXCOM G6 TRANSMITTER) MISC, 1 Device by Does not apply route as directed., Disp: 1 each, Rfl: 3 .  cyanocobalamin (,VITAMIN B-12,) 1000 MCG/ML injection, INJECT 1ML IN THE MUSCLE EVERY 30 DAYS AS DIRECTED, Disp: 3 mL, Rfl: 3 .  fluticasone furoate-vilanterol (BREO ELLIPTA) 200-25 MCG/INH AEPB, Inhale 1 puff into the lungs daily., Disp: 120 each, Rfl: 5 .   glucose blood (TRUE METRIX BLOOD GLUCOSE TEST) test strip, TEST BLOOD GLUCOSE THREE TIMES DAILY, Disp: 300 strip, Rfl: 2 .  HYDROcodone-acetaminophen (NORCO) 5-325 MG tablet, Take 1 tablet by mouth every 6 (six) hours as needed for moderate pain., Disp: 120 tablet, Rfl: 0 .  HYDROcodone-acetaminophen (NORCO/VICODIN) 5-325 MG tablet, Take 1 tablet by mouth every 6 (six) hours as needed for moderate pain., Disp: 120 tablet, Rfl: 0 .  HYDROcodone-acetaminophen (NORCO/VICODIN) 5-325 MG tablet, Take 1 tablet by mouth every 6 (six) hours as needed for moderate pain., Disp: 120 tablet, Rfl: 0 .  insulin aspart (NOVOLOG FLEXPEN) 100 UNIT/ML FlexPen, Max daily  60 units. Using sliding scale, Disp: 30 mL, Rfl: 3 .  insulin glargine, 2 Unit Dial, (TOUJEO MAX SOLOSTAR) 300 UNIT/ML Solostar Pen, Inject 16 Units into the skin at bedtime., Disp: 15 mL, Rfl: 1 .  Insulin Pen Needle 32G X 4 MM MISC, 1 Device by Does not apply route in the morning, at noon, in the evening, and at bedtime., Disp: 400 each, Rfl: 3 .  isosorbide mononitrate (IMDUR) 30 MG 24 hr tablet, TAKE 1 AND 1/2 TABLETS EVERY DAY, Disp: 135 tablet, Rfl: 1 .  letrozole (FEMARA) 2.5 MG tablet, TAKE 1 TABLET (2.5 MG TOTAL) BY MOUTH DAILY., Disp: 90 tablet, Rfl: 3 .  letrozole (FEMARA) 2.5 MG tablet, Take 1 tablet by mouth daily., Disp: , Rfl:  .  LORazepam (ATIVAN) 0.5 MG tablet, TAKE 1 TABLET BY MOUTH EVERY 8 HOURS AS NEEDED FOR ANXIETY(DO NOT EXCEED MORE THAN 2 TABS IN 24 HRS), Disp: 60 tablet, Rfl: 2 .  MAGNESIUM OXIDE 400 PO, Take 400 mg by mouth 2 (two) times daily., Disp: , Rfl:  .  metoprolol succinate (TOPROL-XL) 100 MG 24 hr tablet, TAKE 1 TABLET EVERY DAY  WITH  OR  IMMEDIATELY FOLLOWING A MEAL, Disp: 90 tablet, Rfl: 1 .  nitroGLYCERIN (NITROSTAT) 0.4 MG SL tablet, Place 1 tablet (0.4 mg total) under the tongue every 5 (five) minutes as needed for chest pain., Disp: 24 tablet, Rfl: 1 .  polyethylene glycol (MIRALAX / GLYCOLAX) 17 g packet, as  needed., Disp: , Rfl:  .  potassium chloride (KLOR-CON) 10 MEQ tablet, Take 1 tablet (10 mEq total) by mouth daily., Disp: 90 tablet, Rfl: 1 .  promethazine (PHENERGAN) 12.5 MG tablet, Take 2 tablets (25 mg total) by mouth every 6 (six) hours as needed for nausea., Disp: 12 tablet, Rfl: 0 .  SYRINGE-NEEDLE, DISP, 3 ML (BD SAFETYGLIDE SYRINGE/NEEDLE) 25G X 1" 3 ML MISC, Use for B12 injections, Disp: 100 each, Rfl: 11 .  torsemide (DEMADEX) 20 MG tablet, Take 40 mg in the morning and 20 mg in the afternoon, Disp: 270 tablet, Rfl: 3 .  Blood Glucose Monitoring Suppl (TRUE METRIX GO GLUCOSE METER) w/Device KIT, 30 Units by Does not apply route daily., Disp: 1 kit, Rfl: 0 .  potassium chloride (KLOR-CON) 10 MEQ tablet, , Disp: , Rfl:  No current facility-administered medications for this visit.  Facility-Administered Medications Ordered in Other Visits:  .  sodium chloride 0.9 % 1,000 mL with magnesium sulfate 2 g infusion, , Intravenous, Continuous, Ladell Pier, MD, Stopped at 08/04/14 1441  Review of Systems:  Constitutional: Denies fever, chills, diaphoresis, appetite change and fatigue.  HEENT: Denies photophobia, eye pain, redness, hearing loss, ear pain, congestion, sore throat, rhinorrhea, sneezing, mouth sores, trouble swallowing, neck pain, neck stiffness and tinnitus.   Respiratory: Denies SOB, DOE, cough, chest tightness,  and wheezing.   Cardiovascular: Denies chest pain, palpitations and leg swelling.  Gastrointestinal: Denies nausea, vomiting, abdominal pain, diarrhea, constipation, blood in stool and abdominal distention.  Genitourinary: Denies dysuria, urgency, frequency, hematuria, flank pain and difficulty urinating.  Endocrine: Denies: hot or cold intolerance, sweats, changes in hair or nails, polyuria, polydipsia. Musculoskeletal: Denies myalgias, back pain, joint swelling, arthralgias and gait problem.  Skin: Denies pallor, rash and wound.  Neurological: Denies dizziness,  seizures, syncope, weakness, light-headedness, numbness and headaches.  Hematological: Denies adenopathy. Easy bruising, personal or family bleeding history  Psychiatric/Behavioral: Denies suicidal ideation, mood changes, confusion, nervousness, sleep disturbance and agitation    Physical Exam: Vitals:  04/30/21 1434  BP: 102/60  Pulse: 64  Temp: 98.1 F (36.7 C)  TempSrc: Oral  SpO2: 98%  Weight: 161 lb 6.4 oz (73.2 kg)  Height: _0  (1.575 m)    Body mass index is 29.52 kg/m.   Constitutional: NAD, calm, comfortable Eyes: PERRL, lids and conjunctivae normal, wears corrective lenses ENMT: Mucous membranes are moist.  Respiratory: clear to auscultation bilaterally, no wheezing, no crackles. Normal respiratory effort. No accessory muscle use.  Cardiovascular: Regular rate and rhythm, no murmurs / rubs / gallops.  Neurologic: Grossly intact and nonfocal Psychiatric: Normal judgment and insight. Alert and oriented x 3. Normal mood.    Impression and Plan:  Type 2 diabetes mellitus with stage 3b chronic kidney disease, with long-term current use of insulin ([place]) -Have given her a sample of freestyle libre today. -We will try submitting it through DME to see if it goes through as she finds that the Dexcom is not affordable for her.  Chronic bilateral back pain, unspecified back location -PDMP reviewed, no red flags, overdose risk score is 70. -She is prescribed hydrocodone 5/325 mg to take 1 tablet every 6 hours as needed for a total of 120 tablets a month x3 months. -She is not due today, she will call back at the end of next month for refills.  Anxiety -Refill lorazepam at end of the month for 3 months.    Patient Instructions  -Nice seeing you today!!  -See you back in 3 months.     Lelon Frohlich, MD [facility]

## 2021-04-30 NOTE — Patient Instructions (Signed)
-  Nice seeing you today!!  -See you back in 3 months. 

## 2021-05-02 ENCOUNTER — Inpatient Hospital Stay: Payer: Medicare HMO

## 2021-05-02 ENCOUNTER — Inpatient Hospital Stay: Payer: Medicare HMO | Attending: Oncology

## 2021-05-02 ENCOUNTER — Other Ambulatory Visit: Payer: Self-pay

## 2021-05-02 DIAGNOSIS — C9201 Acute myeloblastic leukemia, in remission: Secondary | ICD-10-CM | POA: Diagnosis not present

## 2021-05-02 DIAGNOSIS — C50912 Malignant neoplasm of unspecified site of left female breast: Secondary | ICD-10-CM | POA: Insufficient documentation

## 2021-05-02 LAB — CBC WITH DIFFERENTIAL (CANCER CENTER ONLY)
Abs Immature Granulocytes: 0.01 10*3/uL (ref 0.00–0.07)
Basophils Absolute: 0 10*3/uL (ref 0.0–0.1)
Basophils Relative: 0 %
Eosinophils Absolute: 0.1 10*3/uL (ref 0.0–0.5)
Eosinophils Relative: 3 %
HCT: 31 % — ABNORMAL LOW (ref 36.0–46.0)
Hemoglobin: 10.3 g/dL — ABNORMAL LOW (ref 12.0–15.0)
Immature Granulocytes: 0 %
Lymphocytes Relative: 30 %
Lymphs Abs: 1.5 10*3/uL (ref 0.7–4.0)
MCH: 30.2 pg (ref 26.0–34.0)
MCHC: 33.2 g/dL (ref 30.0–36.0)
MCV: 90.9 fL (ref 80.0–100.0)
Monocytes Absolute: 0.5 10*3/uL (ref 0.1–1.0)
Monocytes Relative: 9 %
Neutro Abs: 2.9 10*3/uL (ref 1.7–7.7)
Neutrophils Relative %: 58 %
Platelet Count: 126 10*3/uL — ABNORMAL LOW (ref 150–400)
RBC: 3.41 MIL/uL — ABNORMAL LOW (ref 3.87–5.11)
RDW: 14 % (ref 11.5–15.5)
WBC Count: 5 10*3/uL (ref 4.0–10.5)
nRBC: 0 % (ref 0.0–0.2)

## 2021-05-02 LAB — BASIC METABOLIC PANEL - CANCER CENTER ONLY
Anion gap: 9 (ref 5–15)
BUN: 24 mg/dL — ABNORMAL HIGH (ref 8–23)
CO2: 23 mmol/L (ref 22–32)
Calcium: 7.8 mg/dL — ABNORMAL LOW (ref 8.9–10.3)
Chloride: 107 mmol/L (ref 98–111)
Creatinine: 1.29 mg/dL — ABNORMAL HIGH (ref 0.44–1.00)
GFR, Estimated: 42 mL/min — ABNORMAL LOW (ref >60)
Glucose, Bld: 339 mg/dL — ABNORMAL HIGH (ref 70–99)
Potassium: 3.4 mmol/L — ABNORMAL LOW (ref 3.5–5.1)
Sodium: 139 mmol/L (ref 135–145)

## 2021-05-03 ENCOUNTER — Other Ambulatory Visit: Payer: Self-pay

## 2021-05-03 DIAGNOSIS — C9201 Acute myeloblastic leukemia, in remission: Secondary | ICD-10-CM

## 2021-05-14 ENCOUNTER — Other Ambulatory Visit: Payer: Self-pay | Admitting: Internal Medicine

## 2021-05-14 ENCOUNTER — Telehealth: Payer: Self-pay | Admitting: Internal Medicine

## 2021-05-14 DIAGNOSIS — G8929 Other chronic pain: Secondary | ICD-10-CM

## 2021-05-14 DIAGNOSIS — F419 Anxiety disorder, unspecified: Secondary | ICD-10-CM

## 2021-05-14 DIAGNOSIS — M549 Dorsalgia, unspecified: Secondary | ICD-10-CM

## 2021-05-14 MED ORDER — HYDROCODONE-ACETAMINOPHEN 5-325 MG PO TABS: 1.0000 | ORAL_TABLET | Freq: Four times a day (QID) | ORAL | 0 refills | Status: DC | PRN

## 2021-05-14 MED ORDER — LORAZEPAM 0.5 MG PO TABS: ORAL_TABLET | ORAL | 2 refills | Status: DC

## 2021-05-14 NOTE — Telephone Encounter (Signed)
Pt is calling in needing a refill on Rx's hydrocodone-acetaminophen ([place]?VICODIN) 5-325 MG and lorazepam (ATIVAN) 0.5 MG    Pharm:  [facility] on [address] stated that she is not using the Freestyle (Dexcom G6) due to her not being able to set it up on her smart phone and not having a code to do so.  Pt stated that the company told her that they would send something out but it would cost her $50.00 for a device to help monitor her blood sugars and she is not able to afford it. So she will continue to do it the old way by sticking her fingers.

## 2021-05-14 NOTE — Telephone Encounter (Signed)
Last OV: 04/30/2021 Last Refill: 02/20/2021  Disp: 120  R: 0  Future OV: None scheduled

## 2021-05-15 DIAGNOSIS — Z170 Estrogen receptor positive status [ER+]: Secondary | ICD-10-CM | POA: Diagnosis not present

## 2021-05-15 DIAGNOSIS — C50412 Malignant neoplasm of upper-outer quadrant of left female breast: Secondary | ICD-10-CM | POA: Diagnosis not present

## 2021-05-15 NOTE — Telephone Encounter (Signed)
Noted  

## 2021-05-23 ENCOUNTER — Telehealth: Payer: Self-pay | Admitting: Internal Medicine

## 2021-05-27 NOTE — Telephone Encounter (Signed)
  Glucose Blood TEST BLOOD SUGAR [facility] ([facility]) - [address], [place] Phone:  [phone]  Fax:  [phone]

## 2021-06-03 ENCOUNTER — Ambulatory Visit: Payer: Medicare HMO | Attending: Internal Medicine

## 2021-06-03 ENCOUNTER — Inpatient Hospital Stay: Payer: Medicare HMO

## 2021-06-03 ENCOUNTER — Encounter: Payer: Self-pay | Admitting: Oncology

## 2021-06-03 ENCOUNTER — Inpatient Hospital Stay: Payer: Medicare HMO | Attending: Oncology | Admitting: Oncology

## 2021-06-03 ENCOUNTER — Other Ambulatory Visit: Payer: Self-pay

## 2021-06-03 ENCOUNTER — Other Ambulatory Visit (HOSPITAL_BASED_OUTPATIENT_CLINIC_OR_DEPARTMENT_OTHER): Payer: Self-pay

## 2021-06-03 VITALS — BP 160/54 | HR 72 | Temp 98.1°F | Resp 20 | Ht 62.0 in | Wt 158.6 lb

## 2021-06-03 DIAGNOSIS — I4891 Unspecified atrial fibrillation: Secondary | ICD-10-CM | POA: Diagnosis not present

## 2021-06-03 DIAGNOSIS — Z170 Estrogen receptor positive status [ER+]: Secondary | ICD-10-CM | POA: Insufficient documentation

## 2021-06-03 DIAGNOSIS — C9201 Acute myeloblastic leukemia, in remission: Secondary | ICD-10-CM

## 2021-06-03 DIAGNOSIS — Z95828 Presence of other vascular implants and grafts: Secondary | ICD-10-CM

## 2021-06-03 DIAGNOSIS — Z9221 Personal history of antineoplastic chemotherapy: Secondary | ICD-10-CM | POA: Diagnosis not present

## 2021-06-03 DIAGNOSIS — Z86711 Personal history of pulmonary embolism: Secondary | ICD-10-CM | POA: Insufficient documentation

## 2021-06-03 DIAGNOSIS — I509 Heart failure, unspecified: Secondary | ICD-10-CM | POA: Diagnosis not present

## 2021-06-03 DIAGNOSIS — Z23 Encounter for immunization: Secondary | ICD-10-CM

## 2021-06-03 DIAGNOSIS — E119 Type 2 diabetes mellitus without complications: Secondary | ICD-10-CM | POA: Insufficient documentation

## 2021-06-03 DIAGNOSIS — C50912 Malignant neoplasm of unspecified site of left female breast: Secondary | ICD-10-CM | POA: Diagnosis not present

## 2021-06-03 LAB — CBC WITH DIFFERENTIAL (CANCER CENTER ONLY)
Abs Immature Granulocytes: 0.02 10*3/uL (ref 0.00–0.07)
Basophils Absolute: 0 10*3/uL (ref 0.0–0.1)
Basophils Relative: 0 %
Eosinophils Absolute: 0.2 10*3/uL (ref 0.0–0.5)
Eosinophils Relative: 3 %
HCT: 30.7 % — ABNORMAL LOW (ref 36.0–46.0)
Hemoglobin: 10.1 g/dL — ABNORMAL LOW (ref 12.0–15.0)
Immature Granulocytes: 0 %
Lymphocytes Relative: 25 %
Lymphs Abs: 1.4 10*3/uL (ref 0.7–4.0)
MCH: 30 pg (ref 26.0–34.0)
MCHC: 32.9 g/dL (ref 30.0–36.0)
MCV: 91.1 fL (ref 80.0–100.0)
Monocytes Absolute: 0.5 10*3/uL (ref 0.1–1.0)
Monocytes Relative: 9 %
Neutro Abs: 3.7 10*3/uL (ref 1.7–7.7)
Neutrophils Relative %: 63 %
Platelet Count: 141 10*3/uL — ABNORMAL LOW (ref 150–400)
RBC: 3.37 MIL/uL — ABNORMAL LOW (ref 3.87–5.11)
RDW: 13.3 % (ref 11.5–15.5)
WBC Count: 5.8 10*3/uL (ref 4.0–10.5)
nRBC: 0 % (ref 0.0–0.2)

## 2021-06-03 LAB — BASIC METABOLIC PANEL - CANCER CENTER ONLY
Anion gap: 10 (ref 5–15)
BUN: 26 mg/dL — ABNORMAL HIGH (ref 8–23)
CO2: 24 mmol/L (ref 22–32)
Calcium: 7.9 mg/dL — ABNORMAL LOW (ref 8.9–10.3)
Chloride: 106 mmol/L (ref 98–111)
Creatinine: 1.74 mg/dL — ABNORMAL HIGH (ref 0.60–1.20)
GFR, Estimated: 29 mL/min — ABNORMAL LOW (ref >60)
Glucose, Bld: 428 mg/dL — ABNORMAL HIGH (ref 70–99)
Potassium: 3.9 mmol/L (ref 3.5–5.1)
Sodium: 140 mmol/L (ref 135–145)

## 2021-06-03 MED ORDER — SODIUM CHLORIDE 0.9% FLUSH
10.0000 mL | Freq: Once | INTRAVENOUS | Status: AC
Start: 2021-06-03 — End: 2021-06-03
  Administered 2021-06-03: 10 mL via INTRAVENOUS
  Filled 2021-06-03: qty 10

## 2021-06-03 MED ORDER — PFIZER-BIONT COVID-19 VAC-TRIS 30 MCG/0.3ML IM SUSP
INTRAMUSCULAR | 0 refills | Status: DC
  Filled 2021-06-03: qty 0.3, 1d supply, fill #0

## 2021-06-03 MED ORDER — HEPARIN SOD (PORK) LOCK FLUSH 100 UNIT/ML IV SOLN
500.0000 [IU] | Freq: Once | INTRAVENOUS | Status: AC | PRN
  Administered 2021-06-03: 500 [IU] via INTRAVENOUS
  Filled 2021-06-03: qty 5

## 2021-06-03 MED ORDER — SODIUM CHLORIDE 0.9 % IJ SOLN: 10.0000 mL | INTRAMUSCULAR | Status: DC | PRN

## 2021-06-03 NOTE — Progress Notes (Signed)
[facility] OFFICE PROGRESS NOTE   Diagnosis: AML, breast cancer  INTERVAL HISTORY:   [name] returns as scheduled.  She feels well.  No complaint.  She continues letrozole.  She recently saw Dr. Marlou Starks.  Objective:  Vital signs in last 24 hours:  Blood pressure (!) 160/54, pulse 72, temperature 98.1 F (36.7 C), temperature source Oral, resp. rate 20, height $RemoveBe'5\' 2"'uhticQKgS$  (1.575 m), weight 158 lb 9.6 oz (71.9 kg), SpO2 98 %.    Lymphatics: No cervical, supraclavicular, or axillary nodes.  Prominent left greater than right axillary fat pad Resp: Lungs clear bilaterally Cardio: Regular rate and rhythm GI: No hepatosplenomegaly Vascular: Trace edema in the lower leg bilaterally Breast: Status post left lumpectomy.  No evidence for local tumor recurrence.  Portacath/PICC-without erythema  Lab Results:  Lab Results  Component Value Date   WBC 5.8 06/03/2021   HGB 10.1 (L) 06/03/2021   HCT 30.7 (L) 06/03/2021   MCV 91.1 06/03/2021   PLT 141 (L) 06/03/2021   NEUTROABS 3.7 06/03/2021    CMP  Lab Results  Component Value Date   NA 139 05/02/2021   K 3.4 (L) 05/02/2021   CL 107 05/02/2021   CO2 23 05/02/2021   GLUCOSE 339 (H) 05/02/2021   BUN 24 (H) 05/02/2021   CREATININE 1.29 (H) 05/02/2021   CALCIUM 7.8 (L) 05/02/2021   PROT 5.8 (L) 02/18/2021   ALBUMIN 2.9 (L) 02/18/2021   AST 17 02/18/2021   ALT 17 02/18/2021   ALKPHOS 139 (H) 02/18/2021   BILITOT 0.7 02/18/2021   GFRNONAA 42 (L) 05/02/2021   GFRAA 39 (L) 10/04/2020    Medications: I have reviewed the patient's current medications.   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 Status post cycle 3 high-dose araC consolidation 08/22/2014. Bone marrow 09/15/2014 showed necrotic marrow with minute focus of myeloid precursors. No evidence of viable acute leukemia. 2.  History of pancytopenia secondary to chemotherapy.   3. C. difficile colitis-06/09/2014 treated with Flagyl   4. Diabetes   5. renal insufficiency   6. history of hypokalemia and hypomagnesemia. 7. history of atrial fibrillation   9. history of coronary artery disease   10. Hospitalization with febrile neutropenia/sepsis secondary to Escherichia coli bacteremia 09/02/2014. 11. Shingles involving the right back October 2015. 12. Meningioma on brain CT 05/24/2014 and brain MRI 05/24/2014 13. Hospitalization 08/24/2017 through 08/28/2017 with acute CHF. 14.  Admission 11/21/2017 with expressive aphasia, seizure activity noted on EEG, placed on Keppra 15.  Left breast cancer, T1NX, status post a left lumpectomy 10/24/2019-1.5 cm, grade 2, associated with high-grade DCIS with necrosis, ER 95%, PR 95%, HER-2 negative, [id number]-6710% Letrozole 11/02/2019 16.  ER 05/08/2020-dyspnea CT chest 05/08/2020-negative for pulmonary embolism, new patchy sclerosis throughout the thoracic skeleton concerning for bone metastases MRI thoracic spine 06/08/2020-consistent with successfully treated marrow replacement process in 2015, current area of concern at T9 could represent a benign vascular lesion       Disposition: [name] remains in clinical remission from AML.  She has chronic mild anemia, likely related to chronic disease, renal insufficiency, and previous chemotherapy.  [name] will continue letrozole given as adjuvant therapy for breast cancer.  She would like to keep the  Port-A-Cath in place.  She will return for a Port-A-Cath flush in 8 weeks and an office visit in 16 weeks. I recommended she obtain another COVID-19 booster vaccine.  Betsy Coder, MD  06/03/2021  3:55  PM

## 2021-06-03 NOTE — Patient Instructions (Signed)
Implanted Port Home Guide An implanted port is a device that is placed under the skin. It is usually placed in the chest. The device can be used to give IV medicine, to take blood, or for dialysis. You may have an implanted port if: You need IV medicine that would be irritating to the small veins in your hands or arms. You need IV medicines, such as antibiotics, for a long period of time. You need IV nutrition for a long period of time. You need dialysis. When you have a port, your health care provider can choose to use the port instead of veins in your arms for these procedures. You may have fewer limitations when using a port than you would if you used other types of long-term IVs, and you will likely be able to return to normal activities afteryour incision heals. An implanted port has two main parts: Reservoir. The reservoir is the part where a needle is inserted to give medicines or draw blood. The reservoir is round. After it is placed, it appears as a small, raised area under your skin. Catheter. The catheter is a thin, flexible tube that connects the reservoir to a vein. Medicine that is inserted into the reservoir goes into the catheter and then into the vein. How is my port accessed? To access your port: A numbing cream may be placed on the skin over the port site. Your health care provider will put on a mask and sterile gloves. The skin over your port will be cleaned carefully with a germ-killing soap and allowed to dry. Your health care provider will gently pinch the port and insert a needle into it. Your health care provider will check for a blood return to make sure the port is in the vein and is not clogged. If your port needs to remain accessed to get medicine continuously (constant infusion), your health care provider will place a clear bandage (dressing) over the needle site. The dressing and needle will need to be changed every week, or as told by your health care provider. What  is flushing? Flushing helps keep the port from getting clogged. Follow instructions from your health care provider about how and when to flush the port. Ports are usually flushed with saline solution or a medicine called heparin. The need for flushing will depend on how the port is used: If the port is only used from time to time to give medicines or draw blood, the port may need to be flushed: Before and after medicines have been given. Before and after blood has been drawn. As part of routine maintenance. Flushing may be recommended every 4-6 weeks. If a constant infusion is running, the port may not need to be flushed. Throw away any syringes in a disposal container that is meant for sharp items (sharps container). You can buy a sharps container from a pharmacy, or you can make one by using an empty hard plastic bottle with a cover. How long will my port stay implanted? The port can stay in for as long as your health care provider thinks it is needed. When it is time for the port to come out, a surgery will be done to remove it. The surgery will be similar to the procedure that was done to putthe port in. Follow these instructions at home:  Flush your port as told by your health care provider. If you need an infusion over several days, follow instructions from your health care provider about how to take   care of your port site. Make sure you: Wash your hands with soap and water before you change your dressing. If soap and water are not available, use alcohol-based hand sanitizer. Change your dressing as told by your health care provider. Place any used dressings or infusion bags into a plastic bag. Throw that bag in the trash. Keep the dressing that covers the needle clean and dry. Do not get it wet. Do not use scissors or sharp objects near the tube. Keep the tube clamped, unless it is being used. Check your port site every day for signs of infection. Check for: Redness, swelling, or  pain. Fluid or blood. Pus or a bad smell. Protect the skin around the port site. Avoid wearing bra straps that rub or irritate the site. Protect the skin around your port from seat belts. Place a soft pad over your chest if needed. Bathe or shower as told by your health care provider. The site may get wet as long as you are not actively receiving an infusion. Return to your normal activities as told by your health care provider. Ask your health care provider what activities are safe for you. Carry a medical alert card or wear a medical alert bracelet at all times. This will let health care providers know that you have an implanted port in case of an emergency. Get help right away if: You have redness, swelling, or pain at the port site. You have fluid or blood coming from your port site. You have pus or a bad smell coming from the port site. You have a fever. Summary Implanted ports are usually placed in the chest for long-term IV access. Follow instructions from your health care provider about flushing the port and changing bandages (dressings). Take care of the area around your port by avoiding clothing that puts pressure on the area, and by watching for signs of infection. Protect the skin around your port from seat belts. Place a soft pad over your chest if needed. Get help right away if you have a fever or you have redness, swelling, pain, drainage, or a bad smell at the port site. This information is not intended to replace advice given to you by your health care provider. Make sure you discuss any questions you have with your healthcare provider. Document Revised: 04/09/2020 Document Reviewed: 04/09/2020 Elsevier Patient Education  2022 [employer]  

## 2021-06-04 ENCOUNTER — Telehealth: Payer: Self-pay | Admitting: *Deleted

## 2021-06-04 NOTE — Telephone Encounter (Signed)
Informed patient that her glucose reading yesterday was very high. Important to take her diabetes meds consistently, push fluids and check her glucose at home. Contact PCP if blood sugars not controlled. She reports her last reading today was 176.

## 2021-06-04 NOTE — Telephone Encounter (Signed)
-----   Message from Ladell Pier, MD sent at 06/03/2021  5:43 PM EDT ----- [facility] call patient, the blood sugar returned very high on 06/03/2021, please be sure she is taking her diabetic medication and monitoring her blood sugar.  She needs to follow-up with her daughter if the blood sugar remains elevated

## 2021-06-29 ENCOUNTER — Other Ambulatory Visit: Payer: Self-pay | Admitting: Internal Medicine

## 2021-06-29 DIAGNOSIS — I2510 Atherosclerotic heart disease of native coronary artery without angina pectoris: Secondary | ICD-10-CM

## 2021-06-29 DIAGNOSIS — E785 Hyperlipidemia, unspecified: Secondary | ICD-10-CM

## 2021-06-29 DIAGNOSIS — I10 Essential (primary) hypertension: Secondary | ICD-10-CM

## 2021-07-11 ENCOUNTER — Telehealth: Payer: Self-pay

## 2021-07-11 DIAGNOSIS — I2510 Atherosclerotic heart disease of native coronary artery without angina pectoris: Secondary | ICD-10-CM

## 2021-07-11 DIAGNOSIS — I10 Essential (primary) hypertension: Secondary | ICD-10-CM

## 2021-07-11 DIAGNOSIS — E785 Hyperlipidemia, unspecified: Secondary | ICD-10-CM

## 2021-07-11 MED ORDER — AMLODIPINE BESYLATE 10 MG PO TABS: 10.0000 mg | ORAL_TABLET | Freq: Every day | ORAL | 1 refills | Status: DC

## 2021-07-11 MED ORDER — ISOSORBIDE MONONITRATE ER 30 MG PO TB24: ORAL_TABLET | ORAL | 1 refills | Status: DC

## 2021-07-11 MED ORDER — METOPROLOL SUCCINATE ER 100 MG PO TB24: ORAL_TABLET | ORAL | 1 refills | Status: DC

## 2021-07-11 MED ORDER — CYANOCOBALAMIN 1000 MCG/ML IJ SOLN: INTRAMUSCULAR | 3 refills | Status: DC

## 2021-07-11 MED ORDER — CLOPIDOGREL BISULFATE 75 MG PO TABS: 75.0000 mg | ORAL_TABLET | Freq: Every evening | ORAL | 1 refills | Status: DC

## 2021-07-11 MED ORDER — POTASSIUM CHLORIDE CRYS ER 10 MEQ PO TBCR: 10.0000 meq | EXTENDED_RELEASE_TABLET | Freq: Every day | ORAL | 1 refills | Status: DC

## 2021-07-11 MED ORDER — ATORVASTATIN CALCIUM 40 MG PO TABS: 40.0000 mg | ORAL_TABLET | Freq: Every day | ORAL | 1 refills | Status: DC

## 2021-07-11 NOTE — Telephone Encounter (Signed)
Reviewed medication list with patient and refills sent.

## 2021-07-11 NOTE — Telephone Encounter (Signed)
Pt called stating she needs refills for all her medication except for controlled meds and would like them sent to [facility]

## 2021-07-11 NOTE — Addendum Note (Signed)
Addended by: Westley Hummer B on: 07/11/2021 02:50 PM   Modules accepted: Orders

## 2021-07-17 MED ORDER — METOPROLOL SUCCINATE ER 100 MG PO TB24: 100.0000 mg | ORAL_TABLET | Freq: Every day | ORAL | 1 refills | Status: DC

## 2021-07-17 NOTE — Addendum Note (Signed)
Addended by: Westley Hummer B on: 07/17/2021 08:52 AM   Modules accepted: Orders

## 2021-07-29 ENCOUNTER — Other Ambulatory Visit: Payer: Self-pay

## 2021-07-29 ENCOUNTER — Inpatient Hospital Stay: Payer: Medicare HMO | Attending: Oncology

## 2021-07-29 VITALS — BP 156/56 | HR 63 | Temp 98.5°F | Resp 20

## 2021-07-29 DIAGNOSIS — Z95828 Presence of other vascular implants and grafts: Secondary | ICD-10-CM

## 2021-07-29 DIAGNOSIS — C9201 Acute myeloblastic leukemia, in remission: Secondary | ICD-10-CM | POA: Diagnosis not present

## 2021-07-29 DIAGNOSIS — Z452 Encounter for adjustment and management of vascular access device: Secondary | ICD-10-CM | POA: Insufficient documentation

## 2021-07-29 DIAGNOSIS — C50912 Malignant neoplasm of unspecified site of left female breast: Secondary | ICD-10-CM | POA: Diagnosis not present

## 2021-07-29 MED ORDER — SODIUM CHLORIDE 0.9 % IJ SOLN: 10.0000 mL | INTRAMUSCULAR | Status: DC | PRN

## 2021-07-29 MED ORDER — LETROZOLE 2.5 MG PO TABS: 2.5000 mg | ORAL_TABLET | Freq: Every day | ORAL | 3 refills | Status: DC

## 2021-07-29 MED ORDER — HEPARIN SOD (PORK) LOCK FLUSH 100 UNIT/ML IV SOLN
500.0000 [IU] | Freq: Once | INTRAVENOUS | Status: AC | PRN
  Administered 2021-07-29: 500 [IU] via INTRAVENOUS

## 2021-07-30 ENCOUNTER — Encounter: Payer: Self-pay | Admitting: Oncology

## 2021-08-13 ENCOUNTER — Telehealth: Payer: Self-pay | Admitting: Internal Medicine

## 2021-08-13 NOTE — Telephone Encounter (Signed)
Last office visit 04/30/21 Last refills 05/14/21

## 2021-08-13 NOTE — Telephone Encounter (Signed)
Patient states she needs refill on HYDROcodone-acetaminophen (NORCO) 5-325 MG tablet and  LORazepam (ATIVAN) 0.5 MG tablet.   Send to  [facility] [place], [place] AT [address] Phone:  [phone]  Fax:  [phone]      Good callback number is [phone]     Please Advise

## 2021-08-14 ENCOUNTER — Encounter: Payer: Self-pay | Admitting: Internal Medicine

## 2021-08-14 ENCOUNTER — Ambulatory Visit (INDEPENDENT_AMBULATORY_CARE_PROVIDER_SITE_OTHER): Payer: Medicare HMO | Admitting: Internal Medicine

## 2021-08-14 ENCOUNTER — Other Ambulatory Visit: Payer: Self-pay

## 2021-08-14 VITALS — BP 150/78 | HR 78 | Ht 62.0 in | Wt 158.4 lb

## 2021-08-14 DIAGNOSIS — E1159 Type 2 diabetes mellitus with other circulatory complications: Secondary | ICD-10-CM

## 2021-08-14 DIAGNOSIS — N183 Chronic kidney disease, stage 3 unspecified: Secondary | ICD-10-CM | POA: Diagnosis not present

## 2021-08-14 DIAGNOSIS — E1122 Type 2 diabetes mellitus with diabetic chronic kidney disease: Secondary | ICD-10-CM

## 2021-08-14 DIAGNOSIS — Z794 Long term (current) use of insulin: Secondary | ICD-10-CM | POA: Diagnosis not present

## 2021-08-14 DIAGNOSIS — E1165 Type 2 diabetes mellitus with hyperglycemia: Secondary | ICD-10-CM

## 2021-08-14 DIAGNOSIS — N1832 Chronic kidney disease, stage 3b: Secondary | ICD-10-CM

## 2021-08-14 DIAGNOSIS — J449 Chronic obstructive pulmonary disease, unspecified: Secondary | ICD-10-CM | POA: Diagnosis not present

## 2021-08-14 LAB — POCT GLYCOSYLATED HEMOGLOBIN (HGB A1C): Hemoglobin A1C: 8 % — AB (ref 4.0–5.6)

## 2021-08-14 LAB — POCT GLUCOSE (DEVICE FOR HOME USE)
POC Glucose: 102 mg/dl — AB (ref 70–99)
POC Glucose: 83 mg/dl (ref 70–99)

## 2021-08-14 LAB — GLUCOSE, POCT (MANUAL RESULT ENTRY): POC Glucose: 114 mg/dl — AB (ref 70–99)

## 2021-08-14 NOTE — Telephone Encounter (Signed)
Spoke with patient and a VV scheduled 08/16/21

## 2021-08-14 NOTE — Patient Instructions (Addendum)
-   Decrease Toujeo to 14  units daily  - Decrease Novolog to 10 units with each meal   Novolog correctional insulin: ADD extra units on insulin to your meal-time Novolog dose if your blood sugars are higher than 165. Use the scale below to help guide you:   Blood sugar before meal Number of units to inject  Less than 165 0 unit  166 -  200 1 units  201 -  235 2 units  236 -  270 3 units  271 -  305 4 units  306 -  340 5 units  341 -  375 6 units  376 -  410 7 units  411 -  445 8 units         HOW TO TREAT LOW BLOOD SUGARS (Blood sugar LESS THAN 70 MG/DL) Please follow the RULE OF 15 for the treatment of hypoglycemia treatment (when your (blood sugars are less than 70 mg/dL)   STEP 1: Take 15 grams of carbohydrates when your blood sugar is low, which includes:  3-4 GLUCOSE TABS  OR 3-4 OZ OF JUICE OR REGULAR SODA OR ONE TUBE OF GLUCOSE GEL    STEP 2: RECHECK blood sugar in 15 MINUTES STEP 3: If your blood sugar is still low at the 15 minute recheck --> then, go back to STEP 1 and treat AGAIN with another 15 grams of carbohydrates.

## 2021-08-14 NOTE — Progress Notes (Signed)
Name: NYIMAH SHADDUCK  Age/ Sex: 80 y.o., female   MRN/ DOB: [id number], [birth year]     PCP: Isaac Bliss, Rayford Halsted, MD   Reason for Endocrinology Evaluation: Type 2 Diabetes Mellitus  Initial Endocrine Consultative Visit: 05/04/2019    PATIENT IDENTIFIER: Ms. BRETTNEY FICKEN is a 80 y.o. 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 The patient has followed with Endocrinology clinic since 05/04/2019 for consultative assistance with management of her diabetes.  DIABETIC HISTORY:  Ms. Trosper was diagnosed with T2DM in 2008, she was on metformin, SU and pioglitazone has been on and started insulin therapy many years ago. Her hemoglobin A1c has ranged from 6.6% in 2015, peaking at 12.4% in 2018.   Dexcom cost prohibitive 04/2021    She continues to work at an [employer] place.   SUBJECTIVE:   During the last visit (04/10/2021): A1c was 11.3 % , We adjusted MDI regimen      Today (08/14/2021): Ms. Perona is here for a follow up on diabetes management.  She checks her blood sugars occasionally .She has two meters  and has been  The patient has had hypoglycemic episodes mainly at night    She saw oncology with AML remission 05/2021 Dexcom cost prohibitive   She felt weak in the office with Bg 114 mg/dL  Has tingling of the feet    HOME DIABETES REGIMEN:  Toujeo 16 units QHS  Novolog  12 units TID QAC CF: Novolog (BG -140/25)       GLUCOSE METER:  Unable to download. Dates are incorrect showing November  49 -239     HISTORY:  Past Medical History:  Past Medical History:  Diagnosis Date  . acute myeloblastic leukemia (AML) dx'd 06/2014  . Acute myelogenous leukemia ([place]) 02/2014  . Acute pancreatitis   . Arthritis    "back" (09/17/2017)  . Atrial flutter ([place])   . Cardiomyopathy ([place])   . Chronic kidney disease, stage II (mild)   . Chronic lower back pain   . Colon polyps 04/29/2010   TUBULAR  ADENOMA AND A SERRATED ADENOMA  . COPD (chronic obstructive pulmonary disease) ([place])   . CORONARY ARTERY DISEASE 12/24/2007  . DIABETES MELLITUS, TYPE II 07/13/2007  . Family history of cervical cancer   . Family history of esophageal cancer   . Family history of leukemia   . Family history of lung cancer   . Family history of skin cancer   . History of blood transfusion 2015   "related to leukemia"  . History of kidney stones 12/24/2007  . History of uterine cancer   . HYPERLIPIDEMIA 12/24/2007  . HYPERTENSION 07/13/2007  . NSTEMI (non-ST elevated myocardial infarction) ([place]) 09/17/2017  . Unspecified disease of pancreas   . Uterine cancer [facility])    Past Surgical History:  Past Surgical History:  Procedure Laterality Date  . ABDOMINAL HYSTERECTOMY     "still have my ovaries"  . BREAST BIOPSY Left 09/22/2019  . BREAST LUMPECTOMY Left 10/24/2019  . BREAST LUMPECTOMY WITH RADIOACTIVE SEED LOCALIZATION Left 10/24/2019   Procedure: LEFT BREAST LUMPECTOMY WITH RADIOACTIVE SEED LOCALIZATION;  Surgeon: Jovita Kussmaul, MD;  Location: [place];  Service: General;  Laterality: Left;  . CARDIAC CATHETERIZATION  2002  . CARDIAC CATHETERIZATION  09/17/2017  . CATARACT EXTRACTION W/ INTRAOCULAR LENS  IMPLANT, BILATERAL Bilateral   . COLONOSCOPY W/ BIOPSIES AND POLYPECTOMY  2011  .  CORONARY STENT INTERVENTION N/A 09/21/2017   Procedure: CORONARY STENT INTERVENTION;  Surgeon: Troy Sine, MD;  Location: [place] [facility];  Service: Cardiovascular;  Laterality: N/A;  . DILATION AND CURETTAGE OF UTERUS    . EXCISIONAL HEMORRHOIDECTOMY  X 2  . GANGLION CYST EXCISION Left X 3  . INTRAVASCULAR PRESSURE WIRE/FFR STUDY N/A 09/17/2017   Procedure: INTRAVASCULAR PRESSURE WIRE/FFR STUDY;  Surgeon: Nelva Bush, MD;  Location: [place] [facility];  Service: Cardiovascular;  Laterality: N/A;  . NASAL SINUS SURGERY    . PORTA CATH INSERTION Right 2013  . RIGHT/LEFT HEART CATH AND  CORONARY ANGIOGRAPHY N/A 09/17/2017   Procedure: RIGHT/LEFT HEART CATH AND CORONARY ANGIOGRAPHY;  Surgeon: Nelva Bush, MD;  Location: [place] [facility];  Service: Cardiovascular;  Laterality: N/A;  . TUBAL LIGATION     Social History:  reports that she quit smoking about 20 years ago. Her smoking use included cigarettes. She has a 20.00 pack-year smoking history. She has never used smokeless tobacco. She reports that she does not drink alcohol and does not use drugs. Family History:  Family History  Problem Relation Age of Onset  . COPD Father   . Esophageal cancer Father   . Breast cancer Sister 54  . Lung cancer Sister   . Esophageal cancer Brother   . Suicidality Brother   . Lung cancer Sister   . Lung cancer Sister   . Cervical cancer Sister   . Congestive Heart Failure Mother   . Heart attack Maternal Grandmother   . Skin cancer Maternal Grandfather   . Heart attack Maternal Grandfather   . Stroke Paternal Grandmother 66  . Heart attack Paternal Grandfather   . Cancer Maternal Uncle        unknown type, dx. >65  . Cancer Paternal Aunt        unknown type, dx. >50  . Cancer Paternal Uncle        unknown type, dx. >50  . Cirrhosis Maternal Uncle   . Skin cancer Daughter   . Leukemia Cousin        maternal first cousin; dx. >50, in remission  . Cancer Niece        unknown type behind her ear, dx. 30s/40s  . Colon cancer Neg Hx      HOME MEDICATIONS: Allergies as of 08/14/2021   No Known Allergies      Medication List        Accurate as of August 14, 2021  4:14 PM. If you have any questions, ask your nurse or doctor.          amLODipine 10 MG tablet Commonly known as: NORVASC Take 1 tablet (10 mg total) by mouth daily.   aspirin EC 81 MG tablet Take 1 tablet (81 mg total) by mouth daily.   atorvastatin 40 MG tablet Commonly known as: LIPITOR Take 1 tablet (40 mg total) by mouth daily.   BD SafetyGlide Syringe/Needle 25G X 1" 3 ML Misc Generic  drug: SYRINGE-NEEDLE (DISP) 3 ML Use for B12 injections   Caltrate 600+D Plus Minerals 600-800 MG-UNIT Tabs Take 1 tablet by mouth daily.   clopidogrel 75 MG tablet Commonly known as: PLAVIX Take 1 tablet (75 mg total) by mouth every evening.   cyanocobalamin 1000 MCG/ML injection Commonly known as: (VITAMIN B-12) INJECT 1ML IN THE MUSCLE EVERY 30 DAYS AS DIRECTED   Dexcom G6 Sensor Misc 1 Device by Does not apply route as directed.   Dexcom G6  Transmitter Misc 1 Device by Does not apply route as directed.   fluticasone furoate-vilanterol 200-25 MCG/INH Aepb Commonly known as: Breo Ellipta Inhale 1 puff into the lungs daily.   HYDROcodone-acetaminophen 5-325 MG tablet Commonly known as: Norco Take 1 tablet by mouth every 6 (six) hours as needed for moderate pain.   HYDROcodone-acetaminophen 5-325 MG tablet Commonly known as: NORCO/VICODIN Take 1 tablet by mouth every 6 (six) hours as needed for moderate pain.   HYDROcodone-acetaminophen 5-325 MG tablet Commonly known as: NORCO/VICODIN Take 1 tablet by mouth every 6 (six) hours as needed for moderate pain.   Insulin Pen Needle 32G X 4 MM Misc 1 Device by Does not apply route in the morning, at noon, in the evening, and at bedtime.   isosorbide mononitrate 30 MG 24 hr tablet Commonly known as: IMDUR Take one and half tab daliy   letrozole 2.5 MG tablet Commonly known as: FEMARA Take 1 tablet by mouth daily.   letrozole 2.5 MG tablet Commonly known as: FEMARA Take 1 tablet (2.5 mg total) by mouth daily.   LORazepam 0.5 MG tablet Commonly known as: ATIVAN TAKE 1 TABLET BY MOUTH EVERY 8 HOURS AS NEEDED FOR ANXIETY(DO NOT EXCEED MORE THAN 2 TABS IN 24 HRS)   MAGNESIUM OXIDE 400 PO Take 400 mg by mouth 2 (two) times daily.   metoprolol succinate 100 MG 24 hr tablet Commonly known as: TOPROL-XL Take 1 tablet (100 mg total) by mouth daily.   nitroGLYCERIN 0.4 MG SL tablet Commonly known as: NITROSTAT PLACE 1  TABLET (0.4 MG TOTAL) UNDER THE TONGUE EVERY 5 (FIVE) MINUTES AS NEEDED FOR CHEST PAIN.   NovoLOG FlexPen 100 UNIT/ML FlexPen Generic drug: insulin aspart Max daily 60 units. Using sliding scale   Pfizer-BioNT COVID-19 Vac-TriS Susp injection Generic drug: COVID-19 mRNA Vac-TriS (Pfizer) Inject into the muscle.   polyethylene glycol 17 g packet Commonly known as: MIRALAX / GLYCOLAX as needed.   potassium chloride 10 MEQ tablet Commonly known as: KLOR-CON Take 1 tablet (10 mEq total) by mouth daily.   promethazine 12.5 MG tablet Commonly known as: PHENERGAN Take 2 tablets (25 mg total) by mouth every 6 (six) hours as needed for nausea.   torsemide 20 MG tablet Commonly known as: DEMADEX Take 40 mg in the morning and 20 mg in the afternoon   Toujeo Max SoloStar 300 UNIT/ML Solostar Pen Generic drug: insulin glargine (2 Unit Dial) Inject 16 Units into the skin at bedtime.   True Metrix Blood Glucose Test test strip Generic drug: glucose blood TEST BLOOD SUGAR THREE TIMES DAILY   True Metrix Go Glucose Meter w/Device Kit 30 Units by Does not apply route daily.         OBJECTIVE:   Vital Signs: BP (!) 160/68 (BP Location: Left Arm, Patient Position: Sitting, Cuff Size: Small)   Pulse 78   Ht _0  (1.575 m)   Wt 158 lb 6.4 oz (71.8 kg)   SpO2 97%   BMI 28.97 kg/m   Wt Readings from Last 3 Encounters:  08/14/21 158 lb 6.4 oz (71.8 kg)  06/03/21 158 lb 9.6 oz (71.9 kg)  04/30/21 161 lb 6.4 oz (73.2 kg)     Exam: General: Pt appears well and is in NAD  Lungs: Clear with good BS bilat with no rales, rhonchi, or wheezes  Heart: RRR with normal S1 and S2 and no gallops; no murmurs; no rub  Abdomen: Normoactive bowel sounds, soft, nontender, without masses or organomegaly palpable  Extremities: 1+  pretibial edema.   Neuro: MS is good with appropriate affect, pt is alert and Ox3       DM foot exam: 08/14/2021 The skin of the feet is intact without sores or  ulcerations. The pedal pulses are 1+ on right and 1+ on left. The sensation is decreased to a screening 5.07, 10 gram monofilament at the right great toe        DATA REVIEWED:  Lab Results  Component Value Date   HGBA1C 8.0 (A) 08/14/2021   HGBA1C 11.2 (A) 04/10/2021   HGBA1C 8.3 (A) 07/18/2020   Lab Results  Component Value Date   MICROALBUR 43.5 (H) 10/27/2018   LDLCALC 45 11/19/2020   CREATININE 1.74 (H) 06/03/2021   Lab Results  Component Value Date   MICRALBCREAT 101.4 (H) 10/27/2018     Lab Results  Component Value Date   CHOL 112 11/19/2020   HDL 49 11/19/2020   LDLCALC 45 11/19/2020   TRIG 94 11/19/2020   CHOLHDL 2.3 11/19/2020   ASSESSMENT / PLAN / RECOMMENDATIONS:   1) Type 2 Diabetes Mellitus, with improving glycemic control , With CKD III , neuropathy and Macrovascular complications - Most recent A1c of 8.0 %. Goal A1c < 7.5 %.     - Improved glycemic control  - She had subjective hypoglycemia in the office with a BG of 114 mg/dL, she was given two sips og juice with repeat Bg 82 mg/dL, she was then given 4 oz of juice incase her BG's continued to drop while on the road.  - Limited glycemic agents due to CKD and hx of pancreatitis  - She was provided with pt assistance papers for [facility] care as insulin is cost prohibitive  - Dexcom cost prohibitive as well  - Will reduce insulin as below   MEDICATIONS: Decrease  Toujeo to 14 units daily  Decrease  Novolog to 10  units TIDQAC CF: Novolog ( BG - 140/25)   EDUCATION / INSTRUCTIONS: BG monitoring instructions: Patient is instructed to check her blood sugars 3-4 times a day, before meals and bedtime . Call [facility] if: BG persistently < 70  I reviewed the Rule of 15 for the treatment of hypoglycemia in detail with the patient. Literature supplied.  2) Diabetic complications:  Eye: Does not have known diabetic retinopathy.  Neuro/ Feet: Does  have known diabetic peripheral  neuropathy. Renal: Patient does have known baseline CKD.        F/U in 4 months   I spent 25 minutes preparing to see the patient by review of recent labs, imaging and procedures, obtaining and reviewing separately obtained history, communicating with the patient ordering medications, tests or procedures, and documenting clinical information in the EHR including the differential Dx, treatment, and any further evaluation and other management   Signed electronically by: Mack Guise, MD  [facility]  [facility] [address] [place], [place] [address] Phone: [phone] FAX: [phone]   CC: Isaac Bliss, Rayford Halsted, MD [place] [address] Phone: [phone]  Fax: [phone]  Return to Endocrinology clinic as below: Future Appointments  Date Time Provider [place]  08/16/2021  3:30 PM Isaac Bliss, Rayford Halsted, MD [facility]  09/23/2021  2:15 PM [facility] PHLEBOTOMIST [facility] None  09/23/2021  2:30 PM [facility] FLUSH ROOM [facility] None  09/23/2021  2:45 PM Owens Shark, NP [facility] None

## 2021-08-16 ENCOUNTER — Other Ambulatory Visit: Payer: Self-pay

## 2021-08-16 ENCOUNTER — Encounter: Payer: Self-pay | Admitting: Internal Medicine

## 2021-08-16 ENCOUNTER — Telehealth (INDEPENDENT_AMBULATORY_CARE_PROVIDER_SITE_OTHER): Payer: Medicare HMO | Admitting: Internal Medicine

## 2021-08-16 VITALS — BP 150/78 | HR 78 | Wt 158.0 lb

## 2021-08-16 DIAGNOSIS — G8929 Other chronic pain: Secondary | ICD-10-CM | POA: Diagnosis not present

## 2021-08-16 DIAGNOSIS — M549 Dorsalgia, unspecified: Secondary | ICD-10-CM

## 2021-08-16 DIAGNOSIS — F419 Anxiety disorder, unspecified: Secondary | ICD-10-CM

## 2021-08-16 MED ORDER — HYDROCODONE-ACETAMINOPHEN 5-325 MG PO TABS: 1.0000 | ORAL_TABLET | Freq: Four times a day (QID) | ORAL | 0 refills | Status: DC | PRN

## 2021-08-16 MED ORDER — HYDROCODONE-ACETAMINOPHEN 5-325 MG PO TABS
1.0000 | ORAL_TABLET | Freq: Four times a day (QID) | ORAL | 0 refills | Status: DC | PRN
Start: 2021-08-16 — End: 2021-12-09

## 2021-08-16 MED ORDER — LORAZEPAM 0.5 MG PO TABS: ORAL_TABLET | ORAL | 2 refills | Status: DC

## 2021-08-16 NOTE — Progress Notes (Signed)
Virtual Visit via Telephone Note  I connected with [name] on 08/16/21 at  3:30 PM EDT by telephone and verified that I am speaking with the correct person using two identifiers.   I discussed the limitations, risks, security and privacy concerns of performing an evaluation and management service by telephone and the availability of in person appointments. I also discussed with the patient that there may be a patient responsible charge related to this service. The patient expressed understanding and agreed to proceed.  Location patient: home Location provider: work office Participants present for the call: patient, provider Patient did not have a visit in the prior 7 days to address this/these issue(s).   History of Present Illness:  She has scheduled this visit for the purpose of medication refills.  She has a history of chronic back pain and is on hydrocodone pain contract with me.  She is also on lorazepam for generalized anxiety.  She is taking medications as prescribed.  She has no acute concerns.   Observations/Objective: Patient sounds cheerful and well on the phone. I do not appreciate any increased work of breathing. Speech and thought processing are grossly intact. Patient reported vitals: None reported   Current Outpatient Medications:    amLODipine (NORVASC) 10 MG tablet, Take 1 tablet (10 mg total) by mouth daily., Disp: 90 tablet, Rfl: 1   aspirin EC 81 MG tablet, Take 1 tablet (81 mg total) by mouth daily., Disp: , Rfl:    atorvastatin (LIPITOR) 40 MG tablet, Take 1 tablet (40 mg total) by mouth daily., Disp: 90 tablet, Rfl: 1   Blood Glucose Monitoring Suppl (TRUE METRIX GO GLUCOSE METER) w/Device KIT, 30 Units by Does not apply route daily., Disp: 1 kit, Rfl: 0   Calcium Carbonate-Vit D-Min (CALTRATE 600+D PLUS MINERALS) 600-800 MG-UNIT TABS, Take 1 tablet by mouth daily., Disp: , Rfl:    clopidogrel (PLAVIX) 75 MG tablet, Take 1 tablet (75 mg total) by mouth  every evening., Disp: 90 tablet, Rfl: 1   Continuous Blood Gluc Sensor (DEXCOM G6 SENSOR) MISC, 1 Device by Does not apply route as directed., Disp: 9 each, Rfl: 3   Continuous Blood Gluc Transmit (DEXCOM G6 TRANSMITTER) MISC, 1 Device by Does not apply route as directed., Disp: 1 each, Rfl: 3   COVID-19 mRNA Vac-TriS, Pfizer, (PFIZER-BIONT COVID-19 VAC-TRIS) SUSP injection, Inject into the muscle., Disp: 0.3 mL, Rfl: 0   cyanocobalamin (,VITAMIN B-12,) 1000 MCG/ML injection, INJECT 1ML IN THE MUSCLE EVERY 30 DAYS AS DIRECTED, Disp: 3 mL, Rfl: 3   fluticasone furoate-vilanterol (BREO ELLIPTA) 200-25 MCG/INH AEPB, Inhale 1 puff into the lungs daily., Disp: 120 each, Rfl: 5   glucose blood (TRUE METRIX BLOOD GLUCOSE TEST) test strip, TEST BLOOD SUGAR THREE TIMES DAILY, Disp: 300 strip, Rfl: 2   insulin aspart (NOVOLOG FLEXPEN) 100 UNIT/ML FlexPen, Max daily 60 units. Using sliding scale, Disp: 30 mL, Rfl: 3   insulin glargine, 2 Unit Dial, (TOUJEO MAX SOLOSTAR) 300 UNIT/ML Solostar Pen, Inject 16 Units into the skin at bedtime., Disp: 15 mL, Rfl: 1   Insulin Pen Needle 32G X 4 MM MISC, 1 Device by Does not apply route in the morning, at noon, in the evening, and at bedtime., Disp: 400 each, Rfl: 3   isosorbide mononitrate (IMDUR) 30 MG 24 hr tablet, Take one and half tab daliy, Disp: 135 tablet, Rfl: 1   letrozole (FEMARA) 2.5 MG tablet, Take 1 tablet by mouth daily., Disp: , Rfl:  letrozole (FEMARA) 2.5 MG tablet, Take 1 tablet (2.5 mg total) by mouth daily., Disp: 90 tablet, Rfl: 3   MAGNESIUM OXIDE 400 PO, Take 400 mg by mouth 2 (two) times daily., Disp: , Rfl:    metoprolol succinate (TOPROL-XL) 100 MG 24 hr tablet, Take 1 tablet (100 mg total) by mouth daily., Disp: 90 tablet, Rfl: 1   nitroGLYCERIN (NITROSTAT) 0.4 MG SL tablet, PLACE 1 TABLET (0.4 MG TOTAL) UNDER THE TONGUE EVERY 5 (FIVE) MINUTES AS NEEDED FOR CHEST PAIN., Disp: 25 tablet, Rfl: 0   polyethylene glycol (MIRALAX / GLYCOLAX) 17 g  packet, as needed., Disp: , Rfl:    potassium chloride (KLOR-CON) 10 MEQ tablet, Take 1 tablet (10 mEq total) by mouth daily., Disp: 90 tablet, Rfl: 1   promethazine (PHENERGAN) 12.5 MG tablet, Take 2 tablets (25 mg total) by mouth every 6 (six) hours as needed for nausea., Disp: 12 tablet, Rfl: 0   SYRINGE-NEEDLE, DISP, 3 ML (BD SAFETYGLIDE SYRINGE/NEEDLE) 25G X 1" 3 ML MISC, Use for B12 injections, Disp: 100 each, Rfl: 11   torsemide (DEMADEX) 20 MG tablet, Take 40 mg in the morning and 20 mg in the afternoon, Disp: 270 tablet, Rfl: 3   HYDROcodone-acetaminophen (NORCO) 5-325 MG tablet, Take 1 tablet by mouth every 6 (six) hours as needed for moderate pain., Disp: 120 tablet, Rfl: 0   HYDROcodone-acetaminophen (NORCO/VICODIN) 5-325 MG tablet, Take 1 tablet by mouth every 6 (six) hours as needed for moderate pain., Disp: 120 tablet, Rfl: 0   HYDROcodone-acetaminophen (NORCO/VICODIN) 5-325 MG tablet, Take 1 tablet by mouth every 6 (six) hours as needed for moderate pain., Disp: 120 tablet, Rfl: 0   LORazepam (ATIVAN) 0.5 MG tablet, TAKE 1 TABLET BY MOUTH EVERY 8 HOURS AS NEEDED FOR ANXIETY(DO NOT EXCEED MORE THAN 2 TABS IN 24 HRS), Disp: 60 tablet, Rfl: 2 No current facility-administered medications for this visit.  Facility-Administered Medications Ordered in Other Visits:    sodium chloride 0.9 % 1,000 mL with magnesium sulfate 2 g infusion, , Intravenous, Continuous, Ladell Pier, MD, Stopped at 08/04/14 1441  Review of Systems:  Constitutional: Denies fever, chills, diaphoresis, appetite change and fatigue.  HEENT: Denies photophobia, eye pain, redness, hearing loss, ear pain, congestion, sore throat, rhinorrhea, sneezing, mouth sores, trouble swallowing, neck pain, neck stiffness and tinnitus.   Respiratory: Denies SOB, DOE, cough, chest tightness,  and wheezing.   Cardiovascular: Denies chest pain, palpitations and leg swelling.  Gastrointestinal: Denies nausea, vomiting, abdominal  pain, diarrhea, constipation, blood in stool and abdominal distention.  Genitourinary: Denies dysuria, urgency, frequency, hematuria, flank pain and difficulty urinating.  Endocrine: Denies: hot or cold intolerance, sweats, changes in hair or nails, polyuria, polydipsia. Musculoskeletal: Denies myalgias, back pain, joint swelling, arthralgias and gait problem.  Skin: Denies pallor, rash and wound.  Neurological: Denies dizziness, seizures, syncope, weakness, light-headedness, numbness and headaches.  Hematological: Denies adenopathy. Easy bruising, personal or family bleeding history  Psychiatric/Behavioral: Denies suicidal ideation, mood changes, confusion, nervousness, sleep disturbance and agitation   Assessment and Plan:  Chronic bilateral back pain, unspecified back location  -PDMP reviewed, no red flags, overdose risk score is 50. -Refill hydrocodone 5/325 mg to take 1 tablet every 6 hours as needed for pain for total of 120 tablets a month x3 months.  Anxiety  - Plan: LORazepam (ATIVAN) 0.5 MG tablet    I discussed the assessment and treatment plan with the patient. The patient was provided an opportunity to ask questions and all were  answered. The patient agreed with the plan and demonstrated an understanding of the instructions.   The patient was advised to call back or seek an in-person evaluation if the symptoms worsen or if the condition fails to improve as anticipated.  I provided 13 minutes of non-face-to-face time during this encounter.   Lelon Frohlich, MD [facility]

## 2021-09-18 ENCOUNTER — Other Ambulatory Visit: Payer: Self-pay

## 2021-09-18 DIAGNOSIS — C9201 Acute myeloblastic leukemia, in remission: Secondary | ICD-10-CM

## 2021-09-18 NOTE — Progress Notes (Signed)
labs entered for 09/23/21 visit per phlebotomy

## 2021-09-23 ENCOUNTER — Other Ambulatory Visit: Payer: Self-pay

## 2021-09-23 ENCOUNTER — Inpatient Hospital Stay: Payer: Medicare HMO

## 2021-09-23 ENCOUNTER — Inpatient Hospital Stay: Payer: Medicare HMO | Admitting: Nurse Practitioner

## 2021-09-23 ENCOUNTER — Inpatient Hospital Stay: Payer: Medicare HMO | Attending: Oncology

## 2021-09-23 ENCOUNTER — Encounter: Payer: Self-pay | Admitting: Nurse Practitioner

## 2021-09-23 VITALS — BP 168/59 | HR 59 | Temp 97.4°F | Resp 18 | Wt 160.0 lb

## 2021-09-23 DIAGNOSIS — C50412 Malignant neoplasm of upper-outer quadrant of left female breast: Secondary | ICD-10-CM

## 2021-09-23 DIAGNOSIS — Z299 Encounter for prophylactic measures, unspecified: Secondary | ICD-10-CM | POA: Diagnosis not present

## 2021-09-23 DIAGNOSIS — C50912 Malignant neoplasm of unspecified site of left female breast: Secondary | ICD-10-CM | POA: Diagnosis not present

## 2021-09-23 DIAGNOSIS — J449 Chronic obstructive pulmonary disease, unspecified: Secondary | ICD-10-CM | POA: Diagnosis not present

## 2021-09-23 DIAGNOSIS — N289 Disorder of kidney and ureter, unspecified: Secondary | ICD-10-CM | POA: Insufficient documentation

## 2021-09-23 DIAGNOSIS — Z170 Estrogen receptor positive status [ER+]: Secondary | ICD-10-CM

## 2021-09-23 DIAGNOSIS — C9201 Acute myeloblastic leukemia, in remission: Secondary | ICD-10-CM

## 2021-09-23 DIAGNOSIS — Z452 Encounter for adjustment and management of vascular access device: Secondary | ICD-10-CM | POA: Diagnosis not present

## 2021-09-23 DIAGNOSIS — Z95828 Presence of other vascular implants and grafts: Secondary | ICD-10-CM

## 2021-09-23 DIAGNOSIS — Z23 Encounter for immunization: Secondary | ICD-10-CM | POA: Diagnosis not present

## 2021-09-23 LAB — CBC WITH DIFFERENTIAL (CANCER CENTER ONLY)
Abs Immature Granulocytes: 0.02 10*3/uL (ref 0.00–0.07)
Basophils Absolute: 0 10*3/uL (ref 0.0–0.1)
Basophils Relative: 0 %
Eosinophils Absolute: 0.1 10*3/uL (ref 0.0–0.5)
Eosinophils Relative: 2 %
HCT: 30.9 % — ABNORMAL LOW (ref 36.0–46.0)
Hemoglobin: 10.2 g/dL — ABNORMAL LOW (ref 12.0–15.0)
Immature Granulocytes: 0 %
Lymphocytes Relative: 26 %
Lymphs Abs: 1.6 10*3/uL (ref 0.7–4.0)
MCH: 30.4 pg (ref 26.0–34.0)
MCHC: 33 g/dL (ref 30.0–36.0)
MCV: 92 fL (ref 80.0–100.0)
Monocytes Absolute: 0.5 10*3/uL (ref 0.1–1.0)
Monocytes Relative: 8 %
Neutro Abs: 3.8 10*3/uL (ref 1.7–7.7)
Neutrophils Relative %: 64 %
Platelet Count: 167 10*3/uL (ref 150–400)
RBC: 3.36 MIL/uL — ABNORMAL LOW (ref 3.87–5.11)
RDW: 12.8 % (ref 11.5–15.5)
WBC Count: 6.1 10*3/uL (ref 4.0–10.5)
nRBC: 0 % (ref 0.0–0.2)

## 2021-09-23 LAB — CMP (CANCER CENTER ONLY)
ALT: 9 U/L (ref 0–44)
AST: 13 U/L — ABNORMAL LOW (ref 15–41)
Albumin: 3.1 g/dL — ABNORMAL LOW (ref 3.5–5.0)
Alkaline Phosphatase: 125 U/L (ref 38–126)
Anion gap: 8 (ref 5–15)
BUN: 21 mg/dL (ref 8–23)
CO2: 27 mmol/L (ref 22–32)
Calcium: 7.8 mg/dL — ABNORMAL LOW (ref 8.9–10.3)
Chloride: 107 mmol/L (ref 98–111)
Creatinine: 1.45 mg/dL — ABNORMAL HIGH (ref 0.44–1.00)
GFR, Estimated: 37 mL/min — ABNORMAL LOW (ref >60)
Glucose, Bld: 97 mg/dL (ref 70–99)
Potassium: 3.3 mmol/L — ABNORMAL LOW (ref 3.5–5.1)
Sodium: 142 mmol/L (ref 135–145)
Total Bilirubin: 0.4 mg/dL (ref 0.3–1.2)
Total Protein: 5.7 g/dL — ABNORMAL LOW (ref 6.5–8.1)

## 2021-09-23 MED ORDER — INFLUENZA VAC A&B SA ADJ QUAD 0.5 ML IM PRSY
0.5000 mL | PREFILLED_SYRINGE | Freq: Once | INTRAMUSCULAR | Status: DC
  Filled 2021-09-23: qty 0.5

## 2021-09-23 MED ORDER — INFLUENZA VAC A&B SA ADJ QUAD 0.5 ML IM PRSY
0.5000 mL | PREFILLED_SYRINGE | Freq: Once | INTRAMUSCULAR | Status: AC
  Administered 2021-09-23: 0.5 mL via INTRAMUSCULAR
  Filled 2021-09-23: qty 0.5

## 2021-09-23 MED ORDER — HEPARIN SOD (PORK) LOCK FLUSH 100 UNIT/ML IV SOLN
500.0000 [IU] | Freq: Once | INTRAVENOUS | Status: AC | PRN
  Administered 2021-09-23: 500 [IU] via INTRAVENOUS

## 2021-09-23 MED ORDER — SODIUM CHLORIDE 0.9% FLUSH
10.0000 mL | Freq: Once | INTRAVENOUS | Status: AC
  Administered 2021-09-23: 10 mL via INTRAVENOUS

## 2021-09-23 NOTE — Progress Notes (Unsigned)
U

## 2021-09-23 NOTE — Progress Notes (Signed)
  [name] OFFICE PROGRESS NOTE   Diagnosis: AML, breast cancer  INTERVAL HISTORY:   [name] returns as scheduled.  She feels well.  She continues letrozole.  No significant arthralgias.  No hot flashes.  She has a good appetite.   Objective:  Vital signs in last 24 hours:  Blood pressure (!) 168/59, pulse (!) 59, temperature (!) 97.4 F (36.3 C), temperature source Temporal, resp. rate 18, weight 160 lb (72.6 kg), SpO2 100 %.    HEENT: No thrush or ulcers. Lymphatics: No palpable cervical, supraclavicular or axillary lymph nodes. Resp: Lungs clear bilaterally. Cardio: Regular rate and rhythm. GI: Abdomen soft and nontender.  No hepatosplenomegaly. Vascular: Trace edema lower leg bilaterally. Breast: Status post left lumpectomy.  No evidence for local tumor recurrence. Port-A-Cath without erythema.  Lab Results:  Lab Results  Component Value Date   WBC 6.1 09/23/2021   HGB 10.2 (L) 09/23/2021   HCT 30.9 (L) 09/23/2021   MCV 92.0 09/23/2021   PLT 167 09/23/2021   NEUTROABS 3.8 09/23/2021    Imaging:  No results found.  Medications: I have reviewed the patient's current medications.  Assessment/Plan: Acute myelogenous leukemia, monocytic differentiation diagnosed in March of 2015   Induction cytarabine/daunorubicin (7+3) followed by reinduction with cytarabine/daunorubicin and zinecard (5+2) with a recovering bone marrow 05/16/2014 consistent with remission   Status post cycle 1 high-dose araC consolidation 05/30/2014   Status post cycle 2 high-dose araC consolidation 07/11/2014. Status post cycle 3 high-dose araC consolidation 08/22/2014. Bone marrow 09/15/2014 showed necrotic marrow with minute focus of myeloid precursors. No evidence of viable acute leukemia. 2.  History of pancytopenia secondary to chemotherapy.   3. C. difficile colitis-06/09/2014 treated with Flagyl   4. Diabetes   5. renal insufficiency   6. history of hypokalemia and  hypomagnesemia. 7. history of atrial fibrillation   9. history of coronary artery disease   10. Hospitalization with febrile neutropenia/sepsis secondary to Escherichia coli bacteremia 09/02/2014. 11. Shingles involving the right back October 2015. 12. Meningioma on brain CT 05/24/2014 and brain MRI 05/24/2014 13. Hospitalization 08/24/2017 through 08/28/2017 with acute CHF. 14.  Admission 11/21/2017 with expressive aphasia, seizure activity noted on EEG, placed on Keppra 15.  Left breast cancer, T1NX, status post a left lumpectomy 10/24/2019-1.5 cm, grade 2, associated with high-grade DCIS with necrosis, ER 95%, PR 95%, HER-2 negative, [id number]% Letrozole 11/02/2019 16.  ER 05/08/2020-dyspnea CT chest 05/08/2020-negative for pulmonary embolism, new patchy sclerosis throughout the thoracic skeleton concerning for bone metastases MRI thoracic spine 06/08/2020-consistent with successfully treated marrow replacement process in 2015, current area of concern at T9 could represent a benign vascular lesion      Disposition: [name] remains in clinical remission from AML.  She has stable mild anemia likely related to chronic disease, renal insufficiency and previous chemotherapy.  She continues letrozole as adjuvant therapy for breast cancer.  Port-A-Cath remains in place.  She will return for a Port-A-Cath flush in 8 weeks.  She will return for lab, flush, follow-up in 16 weeks.    Ned Card ANP/GNP-BC   09/23/2021  3:07 PM

## 2021-09-23 NOTE — Patient Instructions (Signed)
Implanted Port Home Guide An implanted port is a device that is placed under the skin. It is usually placed in the chest. The device can be used to give IV medicine, to take blood, or for dialysis. You may have an implanted port if: You need IV medicine that would be irritating to the small veins in your hands or arms. You need IV medicines, such as antibiotics, for a long period of time. You need IV nutrition for a long period of time. You need dialysis. When you have a port, your health care provider can choose to use the port instead of veins in your arms for these procedures. You may have fewer limitations when using a port than you would if you used other types of long-term IVs, and you will likely be able to return to normal activities after your incision heals. An implanted port has two main parts: Reservoir. The reservoir is the part where a needle is inserted to give medicines or draw blood. The reservoir is round. After it is placed, it appears as a small, raised area under your skin. Catheter. The catheter is a thin, flexible tube that connects the reservoir to a vein. Medicine that is inserted into the reservoir goes into the catheter and then into the vein. How is my port accessed? To access your port: A numbing cream may be placed on the skin over the port site. Your health care provider will put on a mask and sterile gloves. The skin over your port will be cleaned carefully with a germ-killing soap and allowed to dry. Your health care provider will gently pinch the port and insert a needle into it. Your health care provider will check for a blood return to make sure the port is in the vein and is not clogged. If your port needs to remain accessed to get medicine continuously (constant infusion), your health care provider will place a clear bandage (dressing) over the needle site. The dressing and needle will need to be changed every week, or as told by your health care provider. What  is flushing? Flushing helps keep the port from getting clogged. Follow instructions from your health care provider about how and when to flush the port. Ports are usually flushed with saline solution or a medicine called heparin. The need for flushing will depend on how the port is used: If the port is only used from time to time to give medicines or draw blood, the port may need to be flushed: Before and after medicines have been given. Before and after blood has been drawn. As part of routine maintenance. Flushing may be recommended every 4-6 weeks. If a constant infusion is running, the port may not need to be flushed. Throw away any syringes in a disposal container that is meant for sharp items (sharps container). You can buy a sharps container from a pharmacy, or you can make one by using an empty hard plastic bottle with a cover. How long will my port stay implanted? The port can stay in for as long as your health care provider thinks it is needed. When it is time for the port to come out, a surgery will be done to remove it. The surgery will be similar to the procedure that was done to put the port in. Follow these instructions at home:  Flush your port as told by your health care provider. If you need an infusion over several days, follow instructions from your health care provider about how   to take care of your port site. Make sure you: Wash your hands with soap and water before you change your dressing. If soap and water are not available, use alcohol-based hand sanitizer. Change your dressing as told by your health care provider. Place any used dressings or infusion bags into a plastic bag. Throw that bag in the trash. Keep the dressing that covers the needle clean and dry. Do not get it wet. Do not use scissors or sharp objects near the tube. Keep the tube clamped, unless it is being used. Check your port site every day for signs of infection. Check for: Redness, swelling, or  pain. Fluid or blood. Pus or a bad smell. Protect the skin around the port site. Avoid wearing bra straps that rub or irritate the site. Protect the skin around your port from seat belts. Place a soft pad over your chest if needed. Bathe or shower as told by your health care provider. The site may get wet as long as you are not actively receiving an infusion. Return to your normal activities as told by your health care provider. Ask your health care provider what activities are safe for you. Carry a medical alert card or wear a medical alert bracelet at all times. This will let health care providers know that you have an implanted port in case of an emergency. Get help right away if: You have redness, swelling, or pain at the port site. You have fluid or blood coming from your port site. You have pus or a bad smell coming from the port site. You have a fever. Summary Implanted ports are usually placed in the chest for long-term IV access. Follow instructions from your health care provider about flushing the port and changing bandages (dressings). Take care of the area around your port by avoiding clothing that puts pressure on the area, and by watching for signs of infection. Protect the skin around your port from seat belts. Place a soft pad over your chest if needed. Get help right away if you have a fever or you have redness, swelling, pain, drainage, or a bad smell at the port site. This information is not intended to replace advice given to you by your health care provider. Make sure you discuss any questions you have with your health care provider. Document Revised: 02/13/2021 Document Reviewed: 04/09/2020 Elsevier Patient Education  2022 [employer]  

## 2021-09-24 ENCOUNTER — Telehealth: Payer: Self-pay

## 2021-09-24 NOTE — Telephone Encounter (Signed)
Pt called message left for a return call to speak more about potassium and new instructions currently awaiting return call

## 2021-09-24 NOTE — Telephone Encounter (Signed)
-----   Message from Owens Shark, NP sent at 09/24/2021  8:25 AM EDT ----- Please call her-potassium is mildly low.  Has she been taking potassium?  List indicates 10 mEq daily.  If she has been taking this please have her increase to 20 mEq daily.  If she has not been taking please have her resume 10 mEq daily.

## 2021-10-04 ENCOUNTER — Other Ambulatory Visit: Payer: Self-pay | Admitting: Nurse Practitioner

## 2021-11-18 ENCOUNTER — Inpatient Hospital Stay: Payer: Medicare HMO | Attending: Oncology

## 2021-11-18 ENCOUNTER — Other Ambulatory Visit: Payer: Self-pay

## 2021-11-18 VITALS — BP 155/50 | HR 76 | Temp 98.4°F | Resp 18

## 2021-11-18 DIAGNOSIS — Z452 Encounter for adjustment and management of vascular access device: Secondary | ICD-10-CM | POA: Insufficient documentation

## 2021-11-18 DIAGNOSIS — C9201 Acute myeloblastic leukemia, in remission: Secondary | ICD-10-CM | POA: Insufficient documentation

## 2021-11-18 DIAGNOSIS — C50912 Malignant neoplasm of unspecified site of left female breast: Secondary | ICD-10-CM | POA: Insufficient documentation

## 2021-11-18 DIAGNOSIS — Z95828 Presence of other vascular implants and grafts: Secondary | ICD-10-CM

## 2021-11-18 MED ORDER — HEPARIN SOD (PORK) LOCK FLUSH 100 UNIT/ML IV SOLN
500.0000 [IU] | Freq: Once | INTRAVENOUS | Status: AC
Start: 2021-11-18 — End: 2021-11-18
  Administered 2021-11-18: 500 [IU] via INTRAVENOUS

## 2021-11-18 MED ORDER — SODIUM CHLORIDE 0.9% FLUSH
10.0000 mL | Freq: Once | INTRAVENOUS | Status: AC
  Administered 2021-11-18: 10 mL via INTRAVENOUS

## 2021-11-18 NOTE — Patient Instructions (Signed)
[facility]  Discharge Instructions: Thank you for choosing [facility] to provide your oncology and hematology care.   If you have a lab appointment with the [facility], please go directly to the [facility] and check in at the registration area.   Wear comfortable clothing and clothing appropriate for easy access to any Portacath or PICC line.   We strive to give you quality time with your provider. You may need to reschedule your appointment if you arrive late (15 or more minutes).  Arriving late affects you and other patients whose appointments are after yours.  Also, if you miss three or more appointments without notifying the office, you may be dismissed from the clinic at the provider's discretion.      For prescription refill requests, have your pharmacy contact our office and allow 72 hours for refills to be refilled Today you received the following port flush   To help prevent nausea and vomiting after your treatment, we encourage you to take your nausea medication as directed.  BELOW ARE SYMPTOMS THAT SHOULD BE REPORTED IMMEDIATELY: *FEVER GREATER THAN 100.4 F (38 C) OR HIGHER *CHILLS OR SWEATING *NAUSEA AND VOMITING THAT IS NOT CONTROLLED WITH YOUR NAUSEA MEDICATION *UNUSUAL SHORTNESS OF BREATH *UNUSUAL BRUISING OR BLEEDING *URINARY PROBLEMS (pain or burning when urinating, or frequent urination) *BOWEL PROBLEMS (unusual diarrhea, constipation, pain near the anus) TENDERNESS IN MOUTH AND THROAT WITH OR WITHOUT PRESENCE OF ULCERS (sore throat, sores in mouth, or a toothache) UNUSUAL RASH, SWELLING OR PAIN  UNUSUAL VAGINAL DISCHARGE OR ITCHING   Items with * indicate a potential emergency and should be followed up as soon as possible or go to the Emergency Department if any problems should occur.  Please show the CHEMOTHERAPY ALERT CARD or IMMUNOTHERAPY ALERT CARD at check-in to the Emergency Department and triage nurse.  Should  you have questions after your visit or need to cancel or reschedule your appointment, please contact [facility]  Dept: [phone]  and follow the prompts.  Office hours are 8:00 a.m. to 4:30 p.m. Monday - Friday. Please note that voicemails left after 4:00 p.m. may not be returned until the following business day.  We are closed weekends and major holidays. You have access to a nurse at all times for urgent questions. Please call the main number to the clinic Dept: [phone] and follow the prompts.   For any non-urgent questions, you may also contact your provider using MyChart. We now offer e-Visits for anyone 10 and older to request care online for non-urgent symptoms. For details visit [email or web address].   Also download the MyChart app! Go to the app store, search "MyChart", open the app, select [facility], and log in with your MyChart username and password.  Due to Covid, a mask is required upon entering the hospital/clinic. If you do not have a mask, one will be given to you upon arrival. For doctor visits, patients may have 1 support person aged 22 or older with them. For treatment visits, patients cannot have anyone with them due to current Covid guidelines and our immunocompromised population.   Implanted [facility] Guide An implanted port is a device that is placed under the skin. It is usually placed in the chest. The device may vary based on the need. Implanted ports can be used to give IV medicine, to take blood, or to give fluids. You may have an implanted port if: You need IV medicine  that would be irritating to the small veins in your hands or arms. You need IV medicines, such as chemotherapy, for a long period of time. You need IV nutrition for a long period of time. You may have fewer limitations when using a port than you would if you used other types of long-term IVs. You will also likely be able to return to normal activities after your  incision heals. An implanted port has two main parts: Reservoir. The reservoir is the part where a needle is inserted to give medicines or draw blood. The reservoir is round. After the port is placed, it appears as a small, raised area under your skin. Catheter. The catheter is a small, thin tube that connects the reservoir to a vein. Medicine that is inserted into the reservoir goes into the catheter and then into the vein. How is my port accessed? To access your port: A numbing cream may be placed on the skin over the port site. Your health care provider will put on a mask and sterile gloves. The skin over your port will be cleaned carefully with a germ-killing soap and allowed to dry. Your health care provider will gently pinch the port and insert a needle into it. Your health care provider will check for a blood return to make sure the port is in the vein and is still working (patent). If your port needs to remain accessed to get medicine continuously (constant infusion), your health care provider will place a clear bandage (dressing) over the needle site. The dressing and needle will need to be changed every week, or as told by your health care provider. What is flushing? Flushing helps keep the port working. Follow instructions from your health care provider about how and when to flush the port. Ports are usually flushed with saline solution or a medicine called heparin. The need for flushing will depend on how the port is used: If the port is only used from time to time to give medicines or draw blood, the port may need to be flushed: Before and after medicines have been given. Before and after blood has been drawn. As part of routine maintenance. Flushing may be recommended every 4-6 weeks. If a constant infusion is running, the port may not need to be flushed. Throw away any syringes in a disposal container that is meant for sharp items (sharps container). You can buy a sharps container  from a pharmacy, or you can make one by using an empty hard plastic bottle with a cover. How long will my port stay implanted? The port can stay in for as long as your health care provider thinks it is needed. When it is time for the port to come out, a surgery will be done to remove it. The surgery will be similar to the procedure that was done to put the port in. Follow these instructions at home: Caring for your port and port site Flush your port as told by your health care provider. If you need an infusion over several days, follow instructions from your health care provider about how to take care of your port site. Make sure you: Change your dressing as told by your health care provider. Wash your hands with soap and water for at least 20 seconds before and after you change your dressing. If soap and water are not available, use alcohol-based hand sanitizer. Place any used dressings or infusion bags into a plastic bag. Throw that bag in the trash. Keep the  dressing that covers the needle clean and dry. Do not get it wet. Do not use scissors or sharp objects near the infusion tubing. Keep any external tubes clamped, unless they are being used. Check your port site every day for signs of infection. Check for: Redness, swelling, or pain. Fluid or blood. Warmth. Pus or a bad smell. Protect the skin around the port site. Avoid wearing bra straps that rub or irritate the site. Protect the skin around your port from seat belts. Place a soft pad over your chest if needed. Bathe or shower as told by your health care provider. The site may get wet as long as you are not actively receiving an infusion. General instructions  Return to your normal activities as told by your health care provider. Ask your health care provider what activities are safe for you. Carry a medical alert card or wear a medical alert bracelet at all times. This will let health care providers know that you have an implanted  port in case of an emergency. Where to find more information [employer]: www.cancer.[place] of Clinical Oncology: www.cancer.net Contact a health care provider if: You have a fever or chills. You have redness, swelling, or pain at the port site. You have fluid or blood coming from your port site. Your incision feels warm to the touch. You have pus or a bad smell coming from the port site. Summary Implanted ports are usually placed in the chest for long-term IV access. Follow instructions from your health care provider about flushing the port and changing bandages (dressings). Take care of the area around your port by avoiding clothing that puts pressure on the area, and by watching for signs of infection. Protect the skin around your port from seat belts. Place a soft pad over your chest if needed. Contact a health care provider if you have a fever or you have redness, swelling, pain, fluid, or a bad smell at the port site. This information is not intended to replace advice given to you by your health care provider. Make sure you discuss any questions you have with your health care provider. Document Revised: 05/28/2021 Document Reviewed: 05/28/2021 Elsevier Patient Education  [place].

## 2021-12-05 ENCOUNTER — Emergency Department (HOSPITAL_COMMUNITY): Payer: Medicare HMO

## 2021-12-05 ENCOUNTER — Inpatient Hospital Stay (HOSPITAL_COMMUNITY)
Admission: EM | Admit: 2021-12-05 | Discharge: 2021-12-09 | DRG: 100 | Disposition: A | Discharge status: Home / Self Care | Payer: Medicare HMO | Attending: Internal Medicine | Admitting: Internal Medicine

## 2021-12-05 DIAGNOSIS — E1165 Type 2 diabetes mellitus with hyperglycemia: Secondary | ICD-10-CM

## 2021-12-05 DIAGNOSIS — E871 Hypo-osmolality and hyponatremia: Secondary | ICD-10-CM | POA: Diagnosis present

## 2021-12-05 DIAGNOSIS — K8020 Calculus of gallbladder without cholecystitis without obstruction: Secondary | ICD-10-CM | POA: Diagnosis not present

## 2021-12-05 DIAGNOSIS — N184 Chronic kidney disease, stage 4 (severe): Secondary | ICD-10-CM

## 2021-12-05 DIAGNOSIS — G939 Disorder of brain, unspecified: Secondary | ICD-10-CM | POA: Diagnosis not present

## 2021-12-05 DIAGNOSIS — Z794 Long term (current) use of insulin: Secondary | ICD-10-CM

## 2021-12-05 DIAGNOSIS — I5042 Chronic combined systolic (congestive) and diastolic (congestive) heart failure: Secondary | ICD-10-CM

## 2021-12-05 DIAGNOSIS — I480 Paroxysmal atrial fibrillation: Secondary | ICD-10-CM | POA: Diagnosis present

## 2021-12-05 DIAGNOSIS — E1169 Type 2 diabetes mellitus with other specified complication: Secondary | ICD-10-CM

## 2021-12-05 DIAGNOSIS — R4702 Dysphasia: Secondary | ICD-10-CM | POA: Diagnosis present

## 2021-12-05 DIAGNOSIS — E1100 Type 2 diabetes mellitus with hyperosmolarity without nonketotic hyperglycemic-hyperosmolar coma (NKHHC): Secondary | ICD-10-CM

## 2021-12-05 DIAGNOSIS — Z79811 Long term (current) use of aromatase inhibitors: Secondary | ICD-10-CM | POA: Diagnosis not present

## 2021-12-05 DIAGNOSIS — Z955 Presence of coronary angioplasty implant and graft: Secondary | ICD-10-CM

## 2021-12-05 DIAGNOSIS — C9200 Acute myeloblastic leukemia, not having achieved remission: Secondary | ICD-10-CM | POA: Diagnosis present

## 2021-12-05 DIAGNOSIS — I5043 Acute on chronic combined systolic (congestive) and diastolic (congestive) heart failure: Secondary | ICD-10-CM | POA: Diagnosis present

## 2021-12-05 DIAGNOSIS — Z8049 Family history of malignant neoplasm of other genital organs: Secondary | ICD-10-CM

## 2021-12-05 DIAGNOSIS — Z823 Family history of stroke: Secondary | ICD-10-CM

## 2021-12-05 DIAGNOSIS — R739 Hyperglycemia, unspecified: Secondary | ICD-10-CM | POA: Diagnosis not present

## 2021-12-05 DIAGNOSIS — I130 Hypertensive heart and chronic kidney disease with heart failure and stage 1 through stage 4 chronic kidney disease, or unspecified chronic kidney disease: Secondary | ICD-10-CM | POA: Diagnosis present

## 2021-12-05 DIAGNOSIS — R933 Abnormal findings on diagnostic imaging of other parts of digestive tract: Secondary | ICD-10-CM | POA: Diagnosis not present

## 2021-12-05 DIAGNOSIS — Z170 Estrogen receptor positive status [ER+]: Secondary | ICD-10-CM

## 2021-12-05 DIAGNOSIS — E860 Dehydration: Secondary | ICD-10-CM | POA: Diagnosis present

## 2021-12-05 DIAGNOSIS — Z806 Family history of leukemia: Secondary | ICD-10-CM

## 2021-12-05 DIAGNOSIS — Z7951 Long term (current) use of inhaled steroids: Secondary | ICD-10-CM

## 2021-12-05 DIAGNOSIS — R569 Unspecified convulsions: Principal | ICD-10-CM

## 2021-12-05 DIAGNOSIS — D320 Benign neoplasm of cerebral meninges: Secondary | ICD-10-CM | POA: Diagnosis present

## 2021-12-05 DIAGNOSIS — Z20822 Contact with and (suspected) exposure to covid-19: Secondary | ICD-10-CM | POA: Diagnosis present

## 2021-12-05 DIAGNOSIS — Z87442 Personal history of urinary calculi: Secondary | ICD-10-CM

## 2021-12-05 DIAGNOSIS — R4781 Slurred speech: Secondary | ICD-10-CM | POA: Diagnosis not present

## 2021-12-05 DIAGNOSIS — K7460 Unspecified cirrhosis of liver: Secondary | ICD-10-CM | POA: Diagnosis present

## 2021-12-05 DIAGNOSIS — C9201 Acute myeloblastic leukemia, in remission: Secondary | ICD-10-CM

## 2021-12-05 DIAGNOSIS — Z8673 Personal history of transient ischemic attack (TIA), and cerebral infarction without residual deficits: Secondary | ICD-10-CM | POA: Diagnosis not present

## 2021-12-05 DIAGNOSIS — E1122 Type 2 diabetes mellitus with diabetic chronic kidney disease: Secondary | ICD-10-CM | POA: Diagnosis present

## 2021-12-05 DIAGNOSIS — Z825 Family history of asthma and other chronic lower respiratory diseases: Secondary | ICD-10-CM

## 2021-12-05 DIAGNOSIS — G4089 Other seizures: Secondary | ICD-10-CM | POA: Diagnosis not present

## 2021-12-05 DIAGNOSIS — Z87891 Personal history of nicotine dependence: Secondary | ICD-10-CM

## 2021-12-05 DIAGNOSIS — D696 Thrombocytopenia, unspecified: Secondary | ICD-10-CM | POA: Diagnosis present

## 2021-12-05 DIAGNOSIS — K869 Disease of pancreas, unspecified: Secondary | ICD-10-CM | POA: Diagnosis not present

## 2021-12-05 DIAGNOSIS — N200 Calculus of kidney: Secondary | ICD-10-CM | POA: Diagnosis not present

## 2021-12-05 DIAGNOSIS — I2510 Atherosclerotic heart disease of native coronary artery without angina pectoris: Secondary | ICD-10-CM | POA: Diagnosis present

## 2021-12-05 DIAGNOSIS — E785 Hyperlipidemia, unspecified: Secondary | ICD-10-CM | POA: Diagnosis present

## 2021-12-05 DIAGNOSIS — N179 Acute kidney failure, unspecified: Secondary | ICD-10-CM

## 2021-12-05 DIAGNOSIS — K8689 Other specified diseases of pancreas: Secondary | ICD-10-CM | POA: Diagnosis not present

## 2021-12-05 DIAGNOSIS — I5032 Chronic diastolic (congestive) heart failure: Secondary | ICD-10-CM | POA: Diagnosis present

## 2021-12-05 DIAGNOSIS — Z803 Family history of malignant neoplasm of breast: Secondary | ICD-10-CM

## 2021-12-05 DIAGNOSIS — I4892 Unspecified atrial flutter: Secondary | ICD-10-CM

## 2021-12-05 DIAGNOSIS — D631 Anemia in chronic kidney disease: Secondary | ICD-10-CM | POA: Diagnosis present

## 2021-12-05 DIAGNOSIS — K862 Cyst of pancreas: Secondary | ICD-10-CM | POA: Diagnosis present

## 2021-12-05 DIAGNOSIS — Z800 Family history of malignant neoplasm of digestive organs: Secondary | ICD-10-CM

## 2021-12-05 DIAGNOSIS — C50412 Malignant neoplasm of upper-outer quadrant of left female breast: Secondary | ICD-10-CM | POA: Diagnosis not present

## 2021-12-05 DIAGNOSIS — I252 Old myocardial infarction: Secondary | ICD-10-CM

## 2021-12-05 DIAGNOSIS — Z79899 Other long term (current) drug therapy: Secondary | ICD-10-CM

## 2021-12-05 DIAGNOSIS — R932 Abnormal findings on diagnostic imaging of liver and biliary tract: Secondary | ICD-10-CM | POA: Diagnosis not present

## 2021-12-05 DIAGNOSIS — Z86010 Personal history of colonic polyps: Secondary | ICD-10-CM

## 2021-12-05 DIAGNOSIS — I5033 Acute on chronic diastolic (congestive) heart failure: Secondary | ICD-10-CM

## 2021-12-05 DIAGNOSIS — I10 Essential (primary) hypertension: Secondary | ICD-10-CM | POA: Diagnosis not present

## 2021-12-05 DIAGNOSIS — Z8542 Personal history of malignant neoplasm of other parts of uterus: Secondary | ICD-10-CM

## 2021-12-05 DIAGNOSIS — E876 Hypokalemia: Secondary | ICD-10-CM | POA: Diagnosis not present

## 2021-12-05 DIAGNOSIS — N281 Cyst of kidney, acquired: Secondary | ICD-10-CM | POA: Diagnosis not present

## 2021-12-05 DIAGNOSIS — Z853 Personal history of malignant neoplasm of breast: Secondary | ICD-10-CM

## 2021-12-05 DIAGNOSIS — Z8249 Family history of ischemic heart disease and other diseases of the circulatory system: Secondary | ICD-10-CM

## 2021-12-05 DIAGNOSIS — Z801 Family history of malignant neoplasm of trachea, bronchus and lung: Secondary | ICD-10-CM

## 2021-12-05 DIAGNOSIS — Z7982 Long term (current) use of aspirin: Secondary | ICD-10-CM

## 2021-12-05 DIAGNOSIS — I25118 Atherosclerotic heart disease of native coronary artery with other forms of angina pectoris: Secondary | ICD-10-CM

## 2021-12-05 DIAGNOSIS — G936 Cerebral edema: Secondary | ICD-10-CM | POA: Diagnosis not present

## 2021-12-05 DIAGNOSIS — K7689 Other specified diseases of liver: Secondary | ICD-10-CM | POA: Diagnosis not present

## 2021-12-05 LAB — I-STAT CHEM 8, ED
BUN: 30 mg/dL — ABNORMAL HIGH (ref 8–23)
Calcium, Ion: 1.06 mmol/L — ABNORMAL LOW (ref 1.15–1.40)
Chloride: 99 mmol/L (ref 98–111)
Creatinine, Ser: 1.7 mg/dL — ABNORMAL HIGH (ref 0.44–1.00)
Glucose, Bld: 642 mg/dL (ref 70–99)
HCT: 35 % — ABNORMAL LOW (ref 36.0–46.0)
Hemoglobin: 11.9 g/dL — ABNORMAL LOW (ref 12.0–15.0)
Potassium: 3.9 mmol/L (ref 3.5–5.1)
Sodium: 134 mmol/L — ABNORMAL LOW (ref 135–145)
TCO2: 22 mmol/L (ref 22–32)

## 2021-12-05 LAB — DIFFERENTIAL
Abs Immature Granulocytes: 0.01 10*3/uL (ref 0.00–0.07)
Basophils Absolute: 0 10*3/uL (ref 0.0–0.1)
Basophils Relative: 0 %
Eosinophils Absolute: 0.1 10*3/uL (ref 0.0–0.5)
Eosinophils Relative: 2 %
Immature Granulocytes: 0 %
Lymphocytes Relative: 24 %
Lymphs Abs: 1.4 10*3/uL (ref 0.7–4.0)
Monocytes Absolute: 0.5 10*3/uL (ref 0.1–1.0)
Monocytes Relative: 9 %
Neutro Abs: 3.9 10*3/uL (ref 1.7–7.7)
Neutrophils Relative %: 65 %

## 2021-12-05 LAB — PROTIME-INR
INR: 1.1 (ref 0.8–1.2)
Prothrombin Time: 14.6 seconds (ref 11.4–15.2)

## 2021-12-05 LAB — URINALYSIS, ROUTINE W REFLEX MICROSCOPIC
Bacteria, UA: NONE SEEN
Bilirubin Urine: NEGATIVE
Glucose, UA: >=500 mg/dL — AB
Ketones, ur: NEGATIVE mg/dL
Leukocytes,Ua: NEGATIVE
Nitrite: NEGATIVE
Protein, ur: 30 mg/dL — AB
Specific Gravity, Urine: 1.011 (ref 1.005–1.030)
pH: 5 (ref 5.0–8.0)

## 2021-12-05 LAB — COMPREHENSIVE METABOLIC PANEL
ALT: 31 U/L (ref 0–44)
AST: 47 U/L — ABNORMAL HIGH (ref 15–41)
Albumin: 3.1 g/dL — ABNORMAL LOW (ref 3.5–5.0)
Alkaline Phosphatase: 149 U/L — ABNORMAL HIGH (ref 38–126)
Anion gap: 9 (ref 5–15)
BUN: 29 mg/dL — ABNORMAL HIGH (ref 8–23)
CO2: 21 mmol/L — ABNORMAL LOW (ref 22–32)
Calcium: 7.7 mg/dL — ABNORMAL LOW (ref 8.9–10.3)
Chloride: 101 mmol/L (ref 98–111)
Creatinine, Ser: 1.95 mg/dL — ABNORMAL HIGH (ref 0.44–1.00)
GFR, Estimated: 26 mL/min — ABNORMAL LOW (ref >60)
Glucose, Bld: 622 mg/dL (ref 70–99)
Potassium: 3.8 mmol/L (ref 3.5–5.1)
Sodium: 131 mmol/L — ABNORMAL LOW (ref 135–145)
Total Bilirubin: 0.7 mg/dL (ref 0.3–1.2)
Total Protein: 5.9 g/dL — ABNORMAL LOW (ref 6.5–8.1)

## 2021-12-05 LAB — CBC
HCT: 34.4 % — ABNORMAL LOW (ref 36.0–46.0)
Hemoglobin: 11.7 g/dL — ABNORMAL LOW (ref 12.0–15.0)
MCH: 30.8 pg (ref 26.0–34.0)
MCHC: 34 g/dL (ref 30.0–36.0)
MCV: 90.5 fL (ref 80.0–100.0)
Platelets: 131 10*3/uL — ABNORMAL LOW (ref 150–400)
RBC: 3.8 MIL/uL — ABNORMAL LOW (ref 3.87–5.11)
RDW: 12.2 % (ref 11.5–15.5)
WBC: 6 10*3/uL (ref 4.0–10.5)
nRBC: 0 % (ref 0.0–0.2)

## 2021-12-05 LAB — RESP PANEL BY RT-PCR (FLU A&B, COVID) ARPGX2
Influenza A by PCR: NEGATIVE
Influenza B by PCR: NEGATIVE
SARS Coronavirus 2 by RT PCR: NEGATIVE

## 2021-12-05 LAB — CBG MONITORING, ED
Glucose-Capillary: 532 mg/dL (ref 70–99)
Glucose-Capillary: 541 mg/dL (ref 70–99)

## 2021-12-05 LAB — APTT: aPTT: 30 seconds (ref 24–36)

## 2021-12-05 MED ORDER — LEVETIRACETAM IN NACL 1500 MG/100ML IV SOLN
1500.0000 mg | Freq: Once | INTRAVENOUS | Status: AC
  Administered 2021-12-05: 23:00:00 1500 mg via INTRAVENOUS
  Filled 2021-12-05: qty 100

## 2021-12-05 MED ORDER — SODIUM CHLORIDE 0.9% FLUSH
3.0000 mL | Freq: Two times a day (BID) | INTRAVENOUS | Status: DC
  Administered 2021-12-06 – 2021-12-09 (×7): 3 mL via INTRAVENOUS

## 2021-12-05 MED ORDER — LORAZEPAM 2 MG/ML IJ SOLN
0.5000 mg | Freq: Once | INTRAMUSCULAR | Status: AC
  Administered 2021-12-05: 22:00:00 0.5 mg via INTRAVENOUS

## 2021-12-05 MED ORDER — LETROZOLE 2.5 MG PO TABS
2.5000 mg | ORAL_TABLET | Freq: Every day | ORAL | Status: DC
  Administered 2021-12-06 – 2021-12-09 (×4): 2.5 mg via ORAL
  Filled 2021-12-05 (×5): qty 1

## 2021-12-05 MED ORDER — TORSEMIDE 20 MG PO TABS: 40.0000 mg | ORAL_TABLET | Freq: Every day | ORAL | Status: DC

## 2021-12-05 MED ORDER — ISOSORBIDE MONONITRATE ER 30 MG PO TB24: 30.0000 mg | ORAL_TABLET | Freq: Every day | ORAL | Status: DC

## 2021-12-05 MED ORDER — METOPROLOL SUCCINATE ER 100 MG PO TB24
100.0000 mg | ORAL_TABLET | Freq: Every day | ORAL | Status: DC
  Administered 2021-12-06 – 2021-12-09 (×4): 100 mg via ORAL
  Filled 2021-12-05 (×2): qty 1
  Filled 2021-12-05: qty 4
  Filled 2021-12-05 (×2): qty 1

## 2021-12-05 MED ORDER — CHLORHEXIDINE GLUCONATE CLOTH 2 % EX PADS
6.0000 | MEDICATED_PAD | Freq: Every day | CUTANEOUS | Status: DC
  Administered 2021-12-06 – 2021-12-09 (×4): 6 via TOPICAL

## 2021-12-05 MED ORDER — ATORVASTATIN CALCIUM 40 MG PO TABS
40.0000 mg | ORAL_TABLET | Freq: Every day | ORAL | Status: DC
  Administered 2021-12-06 – 2021-12-09 (×4): 40 mg via ORAL
  Filled 2021-12-05 (×5): qty 1

## 2021-12-05 MED ORDER — LORAZEPAM 2 MG/ML IJ SOLN
1.0000 mg | Freq: Once | INTRAMUSCULAR | Status: DC
  Filled 2021-12-05: qty 1

## 2021-12-05 MED ORDER — AMLODIPINE BESYLATE 10 MG PO TABS
10.0000 mg | ORAL_TABLET | Freq: Every day | ORAL | Status: DC
  Administered 2021-12-06 – 2021-12-09 (×4): 10 mg via ORAL
  Filled 2021-12-05: qty 2
  Filled 2021-12-05 (×4): qty 1

## 2021-12-05 MED ORDER — ENOXAPARIN SODIUM 30 MG/0.3ML IJ SOSY
30.0000 mg | PREFILLED_SYRINGE | INTRAMUSCULAR | Status: DC
  Administered 2021-12-06 – 2021-12-09 (×4): 30 mg via SUBCUTANEOUS
  Filled 2021-12-05 (×5): qty 0.3

## 2021-12-05 MED ORDER — LACTATED RINGERS IV SOLN: INTRAVENOUS | Status: DC

## 2021-12-05 MED ORDER — POLYETHYLENE GLYCOL 3350 17 G PO PACK
17.0000 g | PACK | Freq: Every day | ORAL | Status: DC | PRN
  Administered 2021-12-08: 17 g via ORAL
  Filled 2021-12-05: qty 1

## 2021-12-05 MED ORDER — ACETAMINOPHEN 650 MG RE SUPP: 650.0000 mg | Freq: Four times a day (QID) | RECTAL | Status: DC | PRN

## 2021-12-05 MED ORDER — ACETAMINOPHEN 325 MG PO TABS
650.0000 mg | ORAL_TABLET | Freq: Four times a day (QID) | ORAL | Status: DC | PRN
  Administered 2021-12-09: 650 mg via ORAL
  Filled 2021-12-05: qty 2

## 2021-12-05 MED ORDER — SODIUM CHLORIDE 0.9% FLUSH
10.0000 mL | INTRAVENOUS | Status: DC | PRN
  Administered 2021-12-07: 10 mL

## 2021-12-05 MED ORDER — FLUTICASONE FUROATE-VILANTEROL 200-25 MCG/ACT IN AEPB
1.0000 | INHALATION_SPRAY | Freq: Every day | RESPIRATORY_TRACT | Status: DC
  Filled 2021-12-05: qty 28

## 2021-12-05 MED ORDER — INSULIN ASPART 100 UNIT/ML IJ SOLN: 10.0000 [IU] | Freq: Once | INTRAMUSCULAR | Status: DC

## 2021-12-05 MED ORDER — SODIUM CHLORIDE 0.9 % IV BOLUS: 1000.0000 mL | Freq: Once | INTRAVENOUS | Status: DC

## 2021-12-05 MED ORDER — POTASSIUM CHLORIDE 10 MEQ/100ML IV SOLN
10.0000 meq | INTRAVENOUS | Status: AC
  Administered 2021-12-05 – 2021-12-06 (×2): 10 meq via INTRAVENOUS
  Filled 2021-12-05: qty 100

## 2021-12-05 MED ORDER — DEXTROSE 50 % IV SOLN: 0.0000 mL | INTRAVENOUS | Status: DC | PRN

## 2021-12-05 MED ORDER — DEXTROSE IN LACTATED RINGERS 5 % IV SOLN: INTRAVENOUS | Status: DC

## 2021-12-05 MED ORDER — SODIUM CHLORIDE 0.9% FLUSH
10.0000 mL | Freq: Two times a day (BID) | INTRAVENOUS | Status: DC
  Administered 2021-12-06 – 2021-12-09 (×6): 10 mL

## 2021-12-05 MED ORDER — LACTATED RINGERS IV BOLUS
2000.0000 mL | Freq: Once | INTRAVENOUS | Status: AC
  Administered 2021-12-05: 2000 mL via INTRAVENOUS

## 2021-12-05 MED ORDER — ASPIRIN EC 81 MG PO TBEC
81.0000 mg | DELAYED_RELEASE_TABLET | Freq: Every day | ORAL | Status: DC
  Administered 2021-12-06 – 2021-12-09 (×4): 81 mg via ORAL
  Filled 2021-12-05 (×5): qty 1

## 2021-12-05 MED ORDER — INSULIN REGULAR(HUMAN) IN NACL 100-0.9 UT/100ML-% IV SOLN
INTRAVENOUS | Status: DC
  Administered 2021-12-05: 4.2 [IU]/h via INTRAVENOUS
  Administered 2021-12-06: 03:00:00 0.7 [IU]/h via INTRAVENOUS
  Administered 2021-12-06: 02:00:00 0.8 [IU]/h via INTRAVENOUS
  Administered 2021-12-06: 01:00:00 3.2 [IU]/h via INTRAVENOUS
  Filled 2021-12-05: qty 100

## 2021-12-05 MED ORDER — SODIUM CHLORIDE 0.9% FLUSH
3.0000 mL | Freq: Once | INTRAVENOUS | Status: DC
Start: 2021-12-05 — End: 2021-12-09

## 2021-12-05 NOTE — ED Provider Notes (Addendum)
[facility] EMERGENCY DEPARTMENT Provider Note   CSN: [id number] Arrival date & time: 12/05/21  2106     History Chief Complaint  Patient presents with   Aphasia    [name] is a 80 y.o. female history of AML, CKD, diabetes, here presenting with altered mental status.  Patient is from home and was noted to have some episodes of slurred speech.  Per the family, and last several minutes.  She states that she is unable to speak during those times.  Patient had about 3-4 episodes at home.  Patient had 1 episode witnessed in triage.  She was described to have seizure-like activity involving the right eye and right lip and drooling of the right side.  Patient had no tonic-clonic seizure-like activity.  Patient was given Ativan prior to my exam and is back to baseline now.  Patient also told me that her blood sugar has been elevated at home.  She denies any nausea vomiting or fevers.  The history is provided by the patient and a relative.      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   Acute pancreatitis    Arthritis    "back" (09/17/2017)   Atrial flutter (HCC)    Cardiomyopathy (HCC)    Chronic kidney disease, stage II (mild)    Chronic lower back pain    Colon polyps 04/29/2010   TUBULAR ADENOMA AND A SERRATED ADENOMA   COPD (chronic obstructive pulmonary disease) (HCC)    CORONARY ARTERY DISEASE 12/24/2007   DIABETES MELLITUS, TYPE II 07/13/2007   Family history of cervical cancer    Family history of esophageal cancer    Family history of leukemia    Family history of lung cancer    Family history of skin cancer    History of blood transfusion 2015   "related to leukemia"   History of kidney stones 12/24/2007   History of uterine cancer    HYPERLIPIDEMIA 12/24/2007   HYPERTENSION 07/13/2007   NSTEMI (non-ST elevated myocardial infarction) (HCC) 09/17/2017    Unspecified disease of pancreas    Uterine cancer [facility])     Patient Active Problem List   Diagnosis Date Noted   Acute on chronic diastolic CHF (congestive heart failure) (HCC) 05/30/2020   Genetic testing 12/29/2019   Family history of esophageal cancer    Family history of lung cancer    Family history of cervical cancer    Family history of skin cancer    Family history of leukemia    History of uterine cancer    Carcinoma of upper-outer quadrant of left breast in female, estrogen receptor positive (HCC) 10/17/2019   Type 2 diabetes mellitus with hyperglycemia, with long-term current use of insulin (HCC) 05/04/2019   Type 2 diabetes mellitus with stage 3 chronic kidney disease, with long-term current use of insulin (HCC) 05/04/2019   Diabetes mellitus (HCC) 05/04/2019   Morbid (severe) obesity due to excess calories (HCC) 10/27/2018   Localization-related (focal) (partial) symptomatic epilepsy and epileptic syndromes with complex partial seizures, not intractable, without status epilepticus (HCC) 10/27/2018   Expressive aphasia 11/21/2017   Hyponatremia 11/21/2017   TIA (transient ischemic attack)    Chest pain 09/24/2017   History of non-ST elevation myocardial infarction (NSTEMI)    Chronic combined systolic and diastolic heart failure (HCC)    Vitamin B12 deficiency 10/29/2016   Port catheter in place 05/30/2016  Other pancytopenia ([place]) 08/09/2014   Leukemia, acute ([place]) 06/08/2014   History of TIA (transient ischemic attack) 05/24/2014   CKD (chronic kidney disease) stage 4, GFR 15-29 ml/min ([place]) 05/24/2014   AML (acute myeloblastic leukemia) ([place]) 05/24/2014   Acute leukemia ([place]) 03/02/2014   Cervical spine pain 02/24/2014   Alkaline phosphatase elevation 02/24/2014   Chronic back pain 02/13/2014   PAF- flutter 02/04/2014   Pancreatitis 01/29/2014   Lesion of pancreas 01/29/2014   HIP PAIN, LEFT 01/26/2008   Hyperlipidemia 12/24/2007   CAD (coronary artery  disease) 12/24/2007   CHEST PAIN, ATYPICAL 12/24/2007   NEPHROLITHIASIS, HX OF 12/24/2007   Insulin dependent diabetes mellitus 07/13/2007   Essential hypertension 07/13/2007    Past Surgical History:  Procedure Laterality Date   ABDOMINAL HYSTERECTOMY     "still have my ovaries"   BREAST BIOPSY Left 09/22/2019   BREAST LUMPECTOMY Left 10/24/2019   BREAST LUMPECTOMY WITH RADIOACTIVE SEED LOCALIZATION Left 10/24/2019   Procedure: LEFT BREAST LUMPECTOMY WITH RADIOACTIVE SEED LOCALIZATION;  Surgeon: Jovita Kussmaul, MD;  Location: [place];  Service: General;  Laterality: Left;   CARDIAC CATHETERIZATION  2002   CARDIAC CATHETERIZATION  09/17/2017   CATARACT EXTRACTION W/ INTRAOCULAR LENS  IMPLANT, BILATERAL Bilateral    COLONOSCOPY W/ BIOPSIES AND POLYPECTOMY  2011   CORONARY STENT INTERVENTION N/A 09/21/2017   Procedure: CORONARY STENT INTERVENTION;  Surgeon: Troy Sine, MD;  Location: [place] [facility];  Service: Cardiovascular;  Laterality: N/A;   DILATION AND CURETTAGE OF UTERUS     EXCISIONAL HEMORRHOIDECTOMY  X 2   GANGLION CYST EXCISION Left X 3   INTRAVASCULAR PRESSURE WIRE/FFR STUDY N/A 09/17/2017   Procedure: INTRAVASCULAR PRESSURE WIRE/FFR STUDY;  Surgeon: Nelva Bush, MD;  Location: [place] [facility];  Service: Cardiovascular;  Laterality: N/A;   NASAL SINUS SURGERY     PORTA CATH INSERTION Right 2013   RIGHT/LEFT HEART CATH AND CORONARY ANGIOGRAPHY N/A 09/17/2017   Procedure: RIGHT/LEFT HEART CATH AND CORONARY ANGIOGRAPHY;  Surgeon: Nelva Bush, MD;  Location: [place] [facility];  Service: Cardiovascular;  Laterality: N/A;   TUBAL LIGATION       OB History   No obstetric history on file.     Family History  Problem Relation Age of Onset   COPD Father    Esophageal cancer Father    Breast cancer Sister 55   Lung cancer Sister    Esophageal cancer Brother    Suicidality Brother    Lung cancer Sister    Lung cancer Sister     Cervical cancer Sister    Congestive Heart Failure Mother    Heart attack Maternal Grandmother    Skin cancer Maternal Grandfather    Heart attack Maternal Grandfather    Stroke Paternal Grandmother 57   Heart attack Paternal Grandfather    Cancer Maternal Uncle        unknown type, dx. >65   Cancer Paternal Aunt        unknown type, dx. >50   Cancer Paternal Uncle        unknown type, dx. >50   Cirrhosis Maternal Uncle    Skin cancer Daughter    Leukemia Cousin        maternal first cousin; dx. >50, in remission   Cancer Niece        unknown type behind her ear, dx. 30s/40s   Colon cancer Neg Hx     Social History   Tobacco Use  Smoking status: Former    Packs/day: 1.00    Years: 20.00    Pack years: 20.00    Types: Cigarettes    Quit date: 12/08/2000    Years since quitting: 21.0   Smokeless tobacco: Never  Vaping Use   Vaping Use: Never used  Substance Use Topics   Alcohol use: No    Alcohol/week: 0.0 standard drinks   Drug use: No    Home Medications Prior to Admission medications   Medication Sig Start Date End Date Taking? Authorizing Provider  amLODipine (NORVASC) 10 MG tablet Take 1 tablet (10 mg total) by mouth daily. 07/11/21   Isaac Bliss, Rayford Halsted, MD  aspirin EC 81 MG tablet Take 1 tablet (81 mg total) by mouth daily. 08/17/18   Richardson Dopp T, PA-C  atorvastatin (LIPITOR) 40 MG tablet Take 1 tablet (40 mg total) by mouth daily. 07/11/21   Isaac Bliss, Rayford Halsted, MD  Blood Glucose Monitoring Suppl (TRUE METRIX GO GLUCOSE METER) w/Device KIT 30 Units by Does not apply route daily. 11/08/18   Isaac Bliss, Rayford Halsted, MD  Calcium Carbonate-Vit D-Min (CALTRATE 600+D PLUS MINERALS) 600-800 MG-UNIT TABS Take 1 tablet by mouth daily. 11/02/19   Ladell Pier, MD  clopidogrel (PLAVIX) 75 MG tablet Take 1 tablet (75 mg total) by mouth every evening. 07/11/21   Isaac Bliss, Rayford Halsted, MD  Continuous Blood Gluc Sensor (DEXCOM G6 SENSOR) MISC 1 Device by  Does not apply route as directed. 04/10/21   Shamleffer, Melanie Crazier, MD  Continuous Blood Gluc Transmit (DEXCOM G6 TRANSMITTER) MISC 1 Device by Does not apply route as directed. 04/10/21   Shamleffer, Melanie Crazier, MD  COVID-19 mRNA Vac-TriS, Pfizer, (PFIZER-BIONT COVID-19 VAC-TRIS) SUSP injection Inject into the muscle. 06/03/21   Carlyle Basques, MD  cyanocobalamin (,VITAMIN B-12,) 1000 MCG/ML injection INJECT 1ML IN THE MUSCLE EVERY 30 DAYS AS DIRECTED 07/11/21   Isaac Bliss, Rayford Halsted, MD  fluticasone furoate-vilanterol (BREO ELLIPTA) 200-25 MCG/INH AEPB Inhale 1 puff into the lungs daily. 10/27/18   Isaac Bliss, Rayford Halsted, MD  glucose blood (TRUE METRIX BLOOD GLUCOSE TEST) test strip TEST BLOOD SUGAR THREE TIMES DAILY 05/23/21   Isaac Bliss, Rayford Halsted, MD  HYDROcodone-acetaminophen (NORCO) 5-325 MG tablet Take 1 tablet by mouth every 6 (six) hours as needed for moderate pain. 08/16/21   Isaac Bliss, Rayford Halsted, MD  HYDROcodone-acetaminophen (NORCO/VICODIN) 5-325 MG tablet Take 1 tablet by mouth every 6 (six) hours as needed for moderate pain. 08/16/21   Isaac Bliss, Rayford Halsted, MD  HYDROcodone-acetaminophen (NORCO/VICODIN) 5-325 MG tablet Take 1 tablet by mouth every 6 (six) hours as needed for moderate pain. 08/16/21   Isaac Bliss, Rayford Halsted, MD  insulin aspart (NOVOLOG FLEXPEN) 100 UNIT/ML FlexPen Max daily 60 units. Using sliding scale 04/12/21   Shamleffer, Melanie Crazier, MD  insulin glargine, 2 Unit Dial, (TOUJEO MAX SOLOSTAR) 300 UNIT/ML Solostar Pen Inject 16 Units into the skin at bedtime. 04/10/21   Shamleffer, Melanie Crazier, MD  Insulin Pen Needle 32G X 4 MM MISC 1 Device by Does not apply route in the morning, at noon, in the evening, and at bedtime. 04/10/21   Shamleffer, Melanie Crazier, MD  isosorbide mononitrate (IMDUR) 30 MG 24 hr tablet Take one and half tab daliy 07/11/21   Isaac Bliss, Rayford Halsted, MD  letrozole [facility]) 2.5 MG tablet Take 1 tablet by mouth daily.  02/24/20   [provider]  letrozole (FEMARA) 2.5 MG tablet Take 1 tablet (2.5 mg  total) by mouth daily. 07/29/21   Ladell Pier, MD  LORazepam (ATIVAN) 0.5 MG tablet TAKE 1 TABLET BY MOUTH EVERY 8 HOURS AS NEEDED FOR ANXIETY(DO NOT EXCEED MORE THAN 2 TABS IN 24 HRS) 08/16/21   Isaac Bliss, Rayford Halsted, MD  MAGNESIUM OXIDE 400 PO Take 400 mg by mouth 2 (two) times daily. 06/08/18   [provider]  metoprolol succinate (TOPROL-XL) 100 MG 24 hr tablet Take 1 tablet (100 mg total) by mouth daily. 07/17/21   Isaac Bliss, Rayford Halsted, MD  nitroGLYCERIN (NITROSTAT) 0.4 MG SL tablet PLACE 1 TABLET (0.4 MG TOTAL) UNDER THE TONGUE EVERY 5 (FIVE) MINUTES AS NEEDED FOR CHEST PAIN. 05/23/21   Isaac Bliss, Rayford Halsted, MD  polyethylene glycol (MIRALAX / GLYCOLAX) 17 g packet as needed.    [provider]  potassium chloride (KLOR-CON) 10 MEQ tablet Take 1 tablet (10 mEq total) by mouth daily. 07/11/21   Isaac Bliss, Rayford Halsted, MD  promethazine (PHENERGAN) 12.5 MG tablet Take 2 tablets (25 mg total) by mouth every 6 (six) hours as needed for nausea. 05/10/13   Merrell, Jarrett Soho, PA-C  SYRINGE-NEEDLE, DISP, 3 ML (BD SAFETYGLIDE SYRINGE/NEEDLE) 25G X 1" 3 ML MISC Use for B12 injections 01/10/20   Isaac Bliss, Rayford Halsted, MD  torsemide (DEMADEX) 20 MG tablet Take 40 mg in the morning and 20 mg in the afternoon 11/19/20   Nahser, Wonda Cheng, MD    Allergies    Patient has no known allergies.  Review of Systems   Review of Systems  Neurological:  Positive for seizures.  All other systems reviewed and are negative.  Physical Exam Updated Vital Signs BP (!) 139/51 (BP Location: Left Arm)    Pulse 70    Temp 98.7 F (37.1 C) (Oral)    Resp 20    SpO2 99%   Physical Exam Vitals and nursing note reviewed.  Constitutional:      Comments: Chronically ill  HENT:     Head: Normocephalic.     Nose: Nose normal.     Mouth/Throat:     Mouth: Mucous membranes are dry.  Eyes:      Extraocular Movements: Extraocular movements intact.     Pupils: Pupils are equal, round, and reactive to light.  Cardiovascular:     Rate and Rhythm: Normal rate and regular rhythm.     Pulses: Normal pulses.  Pulmonary:     Effort: Pulmonary effort is normal.     Breath sounds: Normal breath sounds.  Abdominal:     General: Abdomen is flat.     Palpations: Abdomen is soft.  Musculoskeletal:        General: Normal range of motion.     Cervical back: Normal range of motion and neck supple.  Skin:    General: Skin is warm.     Capillary Refill: Capillary refill takes less than 2 seconds.  Neurological:     General: No focal deficit present.     Mental Status: She is oriented to person, place, and time.     Comments: No obvious facial droop.  Patient has normal speech.  Normal strength and sensation bilaterally  Psychiatric:        Mood and Affect: Mood normal.        Behavior: Behavior normal.    ED Results / Procedures / Treatments   Labs (all labs ordered are listed, but only abnormal results are displayed) Labs Reviewed  CBC - Abnormal; Notable for the following  components:      Result Value   RBC 3.80 (*)    Hemoglobin 11.7 (*)    HCT 34.4 (*)    Platelets 131 (*)    All other components within normal limits  COMPREHENSIVE METABOLIC PANEL - Abnormal; Notable for the following components:   Sodium 131 (*)    CO2 21 (*)    Glucose, Bld 622 (*)    BUN 29 (*)    Creatinine, Ser 1.95 (*)    Calcium 7.7 (*)    Total Protein 5.9 (*)    Albumin 3.1 (*)    AST 47 (*)    Alkaline Phosphatase 149 (*)    GFR, Estimated 26 (*)    All other components within normal limits  URINALYSIS, ROUTINE W REFLEX MICROSCOPIC - Abnormal; Notable for the following components:   Color, Urine STRAW (*)    Glucose, UA >=500 (*)    Hgb urine dipstick SMALL (*)    Protein, ur 30 (*)    All other components within normal limits  I-STAT CHEM 8, ED - Abnormal; Notable for the following  components:   Sodium 134 (*)    BUN 30 (*)    Creatinine, Ser 1.70 (*)    Glucose, Bld 642 (*)    Calcium, Ion 1.06 (*)    Hemoglobin 11.9 (*)    HCT 35.0 (*)    All other components within normal limits  CBG MONITORING, ED - Abnormal; Notable for the following components:   Glucose-Capillary 541 (*)    All other components within normal limits  RESP PANEL BY RT-PCR (FLU A&B, COVID) ARPGX2  PROTIME-INR  APTT  DIFFERENTIAL  OSMOLALITY  CBG MONITORING, ED    EKG EKG Interpretation  Date/Time:  Thursday December 05 2021 21:23:08 EST Ventricular Rate:  66 PR Interval:  306 QRS Duration: 86 QT Interval:  428 QTC Calculation: 448 R Axis:   103 Text Interpretation: Sinus rhythm with marked sinus arrhythmia with 1st degree A-V block Rightward axis Low voltage QRS Borderline ECG When compared with ECG of 08-May-2020 09:29, PREVIOUS ECG IS PRESENT No significant change since last tracing Confirmed by Wandra Arthurs [phone]) on 12/05/2021 10:27:37 PM  Radiology CT HEAD WO CONTRAST  Result Date: 12/05/2021 CLINICAL DATA:  Slurred speech. EXAM: CT HEAD WITHOUT CONTRAST TECHNIQUE: Contiguous axial images were obtained from the base of the skull through the vertex without intravenous contrast. COMPARISON:  November 21, 2017 FINDINGS: Brain: There is mild cerebral atrophy with widening of the extra-axial spaces and ventricular dilatation. There are areas of decreased attenuation within the white matter tracts of the supratentorial brain, consistent with microvascular disease changes. A stable 1.5 cm x 1.2 cm x 1.6 cm calcified meningioma is seen along the anterior falx. Vascular: No hyperdense vessels are identified. Skull: Normal. Negative for fracture or focal lesion. Sinuses/Orbits: No acute finding. Other: None. IMPRESSION: 1. Generalized cerebral atrophy. 2. No acute intracranial abnormality. 3. Stable calcified meningioma along the anterior falx. Electronically Signed   By: Virgina Norfolk  M.D.   On: 12/05/2021 22:18    Procedures Procedures   CRITICAL CARE Performed by: Wandra Arthurs   Total critical care time: 40 minutes  Critical care time was exclusive of separately billable procedures and treating other patients.  Critical care was necessary to treat or prevent imminent or life-threatening deterioration.  Critical care was time spent personally by me on the following activities: development of treatment plan with patient and/or surrogate as well as  nursing, discussions with consultants, evaluation of patient's response to treatment, examination of patient, obtaining history from patient or surrogate, ordering and performing treatments and interventions, ordering and review of laboratory studies, ordering and review of radiographic studies, pulse oximetry and re-evaluation of patient's condition.   Medications Ordered in ED Medications  sodium chloride flush (NS) 0.9 % injection 3 mL (3 mLs Intravenous Not Given 12/05/21 2321)  lactated ringers bolus 2,000 mL (has no administration in time range)  insulin regular, human (MYXREDLIN) 100 units/ 100 mL infusion (has no administration in time range)  lactated ringers infusion (has no administration in time range)  dextrose 5 % in lactated ringers infusion (has no administration in time range)  dextrose 50 % solution 0-50 mL (has no administration in time range)  potassium chloride 10 mEq in 100 mL IVPB (has no administration in time range)  sodium chloride flush (NS) 0.9 % injection 10-40 mL (has no administration in time range)  sodium chloride flush (NS) 0.9 % injection 10-40 mL (has no administration in time range)  Chlorhexidine Gluconate Cloth 2 % PADS 6 each (has no administration in time range)  LORazepam (ATIVAN) injection 0.5 mg (0.5 mg Intravenous Given 12/05/21 2151)  levETIRAcetam (KEPPRA) IVPB 1500 mg/ 100 mL premix (1,500 mg Intravenous New Bag/Given 12/05/21 2312)    ED Course  I have reviewed the triage  vital signs and the nursing notes.  Pertinent labs & imaging results that were available during my care of the patient were reviewed by me and considered in my medical decision making (see chart for details).    MDM Rules/Calculators/A&P                         KELCIE CURRIE is a 80 y.o. female here presenting with episodes of slurred speech.  She had a witnessed seizure in triage.  Patient had a history of seizure and no longer on antiepileptic.  Given that she had 4-5 episodes already today, I have ordered Keppra.  Patient's CT head unremarkable.  I also discussed case with Dr. Cheral Marker from neurology.  He states that he will see patient and order EEG.  11:32 PM Patient's labs came back and her glucose is over 600.  Patient's anion gap is normal.  I am concerned for possible HHS causing her symptoms.  I ordered IV fluids and also insulin drip.  Patient will be admitted by the hospital service.      Final Clinical Impression(s) / ED Diagnoses Final diagnoses:  None    Rx / DC Orders ED Discharge Orders     None        Drenda Freeze, MD 12/05/21 2332    Drenda Freeze, MD 12/05/21 [phone]

## 2021-12-05 NOTE — ED Notes (Signed)
Pt transported to CT ?

## 2021-12-05 NOTE — H&P (Signed)
History and Physical   [name] MBB:[id number] DOB: Nov 12, 1941 DOA: 12/05/2021  PCP: Isaac Bliss, Rayford Halsted, MD   Patient coming from: Home  Chief Complaint: Seizure-like activity  HPI: [name] is a 80 y.o. female with medical history significant of leukemia, CHF, elevated alk phos, CAD, breast cancer, chronic pain, CKD 4, diabetes, hypertension, TIA, hyperlipidemia, atrial flutter, seizure-like activity presenting with episodes of seizure-like activity.  Patient has a remote history years ago of prior seizure-like activity with no recurrence prior to today and not on any antiepileptics.  Some history obtained with assistance of family.  Today patient had several episodes of slurred speech that lasted several minutes.  Unable to speak clearly during these episodes and had 3-4 episodes at home.  Also noted to have seizure-like activity with right eye and right lip twitching and some drooling from the right side of her mouth.  1 episode was witnessed in triage.  No tonic-clonic activity.  No loss of bowel or bladder or tongue biting.  Patient did get a dose of Ativan on presentation when she had the activity in triage and had returned to her baseline since then.  Patient also reports some increased blood sugars at home.  Patient reports chronic incontinence as well.  She denies fevers, chills, chest pain, shortness of breath, abdominal pain, constipation, diarrhea, nausea, vomiting.   ED Course: Vital signs in the ED significant for blood pressure in the 838F 840R systolic.  Lab work-up showed CMP with sodium 131 which corrects considering glucose of 622, bicarb 21, gap normal, BUN 29, creatinine elevated to 1.95 from a baseline around 1.5.  Calcium 7.7 which improved considering albumin of 3.1, protein 5.9, AST 47, alk phos chronically elevated 149.  CBC showing anemia at 11.7 which is stable, platelets near baseline at 131.  PT, PTT, INR within normal limits.  Rest were panel for  flu and COVID-negative.  Urinalysis with glucose, hemoglobin, protein but no ketones.  Serum osmolality is pending.  CT head showed generalized atrophy but no acute abnormality.  LG consulted in the ED and will see the patient.  Patient received Keppra load, lorazepam, insulin drip, IV fluids including 2 L to start with a rate after that and 20 mEq of p.o. potassium.   Review of Systems: As per HPI otherwise all other systems reviewed and are negative.  Past Medical History:  Diagnosis Date   acute myeloblastic leukemia (AML) dx'd 06/2014   Acute myelogenous leukemia ([place]) 02/2014   Acute pancreatitis    Arthritis    "back" (09/17/2017)   Atrial flutter (HCC)    Cardiomyopathy (HCC)    Chronic kidney disease, stage II (mild)    Chronic lower back pain    Colon polyps 04/29/2010   TUBULAR ADENOMA AND A SERRATED ADENOMA   COPD (chronic obstructive pulmonary disease) ([place])    CORONARY ARTERY DISEASE 12/24/2007   DIABETES MELLITUS, TYPE II 07/13/2007   Family history of cervical cancer    Family history of esophageal cancer    Family history of leukemia    Family history of lung cancer    Family history of skin cancer    History of blood transfusion 2015   "related to leukemia"   History of kidney stones 12/24/2007   History of uterine cancer    HYPERLIPIDEMIA 12/24/2007   HYPERTENSION 07/13/2007   NSTEMI (non-ST elevated myocardial infarction) ([place]) 09/17/2017   Unspecified disease of pancreas    Uterine cancer ([place])  Past Surgical History:  Procedure Laterality Date   ABDOMINAL HYSTERECTOMY     "still have my ovaries"   BREAST BIOPSY Left 09/22/2019   BREAST LUMPECTOMY Left 10/24/2019   BREAST LUMPECTOMY WITH RADIOACTIVE SEED LOCALIZATION Left 10/24/2019   Procedure: LEFT BREAST LUMPECTOMY WITH RADIOACTIVE SEED LOCALIZATION;  Surgeon: Jovita Kussmaul, MD;  Location: [facility];  Service: General;  Laterality: Left;   CARDIAC CATHETERIZATION  2002   CARDIAC  CATHETERIZATION  09/17/2017   CATARACT EXTRACTION W/ INTRAOCULAR LENS  IMPLANT, BILATERAL Bilateral    COLONOSCOPY W/ BIOPSIES AND POLYPECTOMY  2011   CORONARY STENT INTERVENTION N/A 09/21/2017   Procedure: CORONARY STENT INTERVENTION;  Surgeon: Troy Sine, MD;  Location: [facility];  Service: Cardiovascular;  Laterality: N/A;   DILATION AND CURETTAGE OF UTERUS     EXCISIONAL HEMORRHOIDECTOMY  X 2   GANGLION CYST EXCISION Left X 3   INTRAVASCULAR PRESSURE WIRE/FFR STUDY N/A 09/17/2017   Procedure: INTRAVASCULAR PRESSURE WIRE/FFR STUDY;  Surgeon: Nelva Bush, MD;  Location: [facility];  Service: Cardiovascular;  Laterality: N/A;   NASAL SINUS SURGERY     PORTA CATH INSERTION Right 2013   RIGHT/LEFT HEART CATH AND CORONARY ANGIOGRAPHY N/A 09/17/2017   Procedure: RIGHT/LEFT HEART CATH AND CORONARY ANGIOGRAPHY;  Surgeon: Nelva Bush, MD;  Location: [facility];  Service: Cardiovascular;  Laterality: N/A;   TUBAL LIGATION      Social History  reports that she quit smoking about 21 years ago. Her smoking use included cigarettes. She has a 20.00 pack-year smoking history. She has never used smokeless tobacco. She reports that she does not drink alcohol and does not use drugs.  No Known Allergies  Family History  Problem Relation Age of Onset   COPD Father    Esophageal cancer Father    Breast cancer Sister 70   Lung cancer Sister    Esophageal cancer Brother    Suicidality Brother    Lung cancer Sister    Lung cancer Sister    Cervical cancer Sister    Congestive Heart Failure Mother    Heart attack Maternal Grandmother    Skin cancer Maternal Grandfather    Heart attack Maternal Grandfather    Stroke Paternal Grandmother 26   Heart attack Paternal Grandfather    Cancer Maternal Uncle        unknown type, dx. >65   Cancer Paternal Aunt        unknown type, dx. >50   Cancer Paternal Uncle        unknown type, dx. >50   Cirrhosis Maternal  Uncle    Skin cancer Daughter    Leukemia Cousin        maternal first cousin; dx. >50, in remission   Cancer Niece        unknown type behind her ear, dx. 30s/40s   Colon cancer Neg Hx   Reviewed on admission  Prior to Admission medications   Medication Sig Start Date End Date Taking? Authorizing Provider  amLODipine (NORVASC) 10 MG tablet Take 1 tablet (10 mg total) by mouth daily. 07/11/21   Isaac Bliss, Rayford Halsted, MD  aspirin EC 81 MG tablet Take 1 tablet (81 mg total) by mouth daily. 08/17/18   Richardson Dopp T, PA-C  atorvastatin (LIPITOR) 40 MG tablet Take 1 tablet (40 mg total) by mouth daily. 07/11/21   Isaac Bliss, Rayford Halsted, MD  Blood Glucose Monitoring Suppl (TRUE METRIX GO GLUCOSE METER)  w/Device KIT 30 Units by Does not apply route daily. 11/08/18   Isaac Bliss, Rayford Halsted, MD  Calcium Carbonate-Vit D-Min (CALTRATE 600+D PLUS MINERALS) 600-800 MG-UNIT TABS Take 1 tablet by mouth daily. 11/02/19   Ladell Pier, MD  clopidogrel (PLAVIX) 75 MG tablet Take 1 tablet (75 mg total) by mouth every evening. 07/11/21   Isaac Bliss, Rayford Halsted, MD  Continuous Blood Gluc Sensor (DEXCOM G6 SENSOR) MISC 1 Device by Does not apply route as directed. 04/10/21   Shamleffer, Melanie Crazier, MD  Continuous Blood Gluc Transmit (DEXCOM G6 TRANSMITTER) MISC 1 Device by Does not apply route as directed. 04/10/21   Shamleffer, Melanie Crazier, MD  COVID-19 mRNA Vac-TriS, Pfizer, (PFIZER-BIONT COVID-19 VAC-TRIS) SUSP injection Inject into the muscle. 06/03/21   Carlyle Basques, MD  cyanocobalamin (,VITAMIN B-12,) 1000 MCG/ML injection INJECT 1ML IN THE MUSCLE EVERY 30 DAYS AS DIRECTED 07/11/21   Isaac Bliss, Rayford Halsted, MD  fluticasone furoate-vilanterol (BREO ELLIPTA) 200-25 MCG/INH AEPB Inhale 1 puff into the lungs daily. 10/27/18   Isaac Bliss, Rayford Halsted, MD  glucose blood (TRUE METRIX BLOOD GLUCOSE TEST) test strip TEST BLOOD SUGAR THREE TIMES DAILY 05/23/21   Isaac Bliss, Rayford Halsted, MD   HYDROcodone-acetaminophen (NORCO) 5-325 MG tablet Take 1 tablet by mouth every 6 (six) hours as needed for moderate pain. 08/16/21   Isaac Bliss, Rayford Halsted, MD  HYDROcodone-acetaminophen (NORCO/VICODIN) 5-325 MG tablet Take 1 tablet by mouth every 6 (six) hours as needed for moderate pain. 08/16/21   Isaac Bliss, Rayford Halsted, MD  HYDROcodone-acetaminophen (NORCO/VICODIN) 5-325 MG tablet Take 1 tablet by mouth every 6 (six) hours as needed for moderate pain. 08/16/21   Isaac Bliss, Rayford Halsted, MD  insulin aspart (NOVOLOG FLEXPEN) 100 UNIT/ML FlexPen Max daily 60 units. Using sliding scale 04/12/21   Shamleffer, Melanie Crazier, MD  insulin glargine, 2 Unit Dial, (TOUJEO MAX SOLOSTAR) 300 UNIT/ML Solostar Pen Inject 16 Units into the skin at bedtime. 04/10/21   Shamleffer, Melanie Crazier, MD  Insulin Pen Needle 32G X 4 MM MISC 1 Device by Does not apply route in the morning, at noon, in the evening, and at bedtime. 04/10/21   Shamleffer, Melanie Crazier, MD  isosorbide mononitrate (IMDUR) 30 MG 24 hr tablet Take one and half tab daliy 07/11/21   Isaac Bliss, Rayford Halsted, MD  letrozole [facility]) 2.5 MG tablet Take 1 tablet by mouth daily. 02/24/20   [provider]  letrozole (FEMARA) 2.5 MG tablet Take 1 tablet (2.5 mg total) by mouth daily. 07/29/21   Ladell Pier, MD  LORazepam (ATIVAN) 0.5 MG tablet TAKE 1 TABLET BY MOUTH EVERY 8 HOURS AS NEEDED FOR ANXIETY(DO NOT EXCEED MORE THAN 2 TABS IN 24 HRS) 08/16/21   Isaac Bliss, Rayford Halsted, MD  MAGNESIUM OXIDE 400 PO Take 400 mg by mouth 2 (two) times daily. 06/08/18   [provider]  metoprolol succinate (TOPROL-XL) 100 MG 24 hr tablet Take 1 tablet (100 mg total) by mouth daily. 07/17/21   Isaac Bliss, Rayford Halsted, MD  nitroGLYCERIN (NITROSTAT) 0.4 MG SL tablet PLACE 1 TABLET (0.4 MG TOTAL) UNDER THE TONGUE EVERY 5 (FIVE) MINUTES AS NEEDED FOR CHEST PAIN. 05/23/21   Isaac Bliss, Rayford Halsted, MD  polyethylene glycol (MIRALAX / GLYCOLAX)  17 g packet as needed.    [provider]  potassium chloride (KLOR-CON) 10 MEQ tablet Take 1 tablet (10 mEq total) by mouth daily. 07/11/21   Isaac Bliss, Rayford Halsted, MD  promethazine (PHENERGAN) 12.5  MG tablet Take 2 tablets (25 mg total) by mouth every 6 (six) hours as needed for nausea. 05/10/13   Junious Silk, PA-C  SYRINGE-NEEDLE, DISP, 3 ML (BD SAFETYGLIDE SYRINGE/NEEDLE) 25G X 1" 3 ML MISC Use for B12 injections 01/10/20   Philip Aspen, Limmie Patricia, MD  torsemide [facility]) 20 MG tablet Take 40 mg in the morning and 20 mg in the afternoon 11/19/20   Nahser, Deloris Ping, MD    Physical Exam: Vitals:   12/05/21 2300 12/05/21 2315 12/05/21 2330 12/05/21 2332  BP: (!) 134/56 (!) 153/59 (!) 151/59   Pulse: 66 72 68   Resp:   17   Temp:      TempSrc:      SpO2: 98% 99% 99%   Weight:    74.8 kg  Height:    5\' 2"  (1.575 m)   Physical Exam Constitutional:      General: She is not in acute distress.    Appearance: Normal appearance.  HENT:     Head: Normocephalic and atraumatic.     Mouth/Throat:     Mouth: Mucous membranes are moist.     Pharynx: Oropharynx is clear.  Eyes:     Extraocular Movements: Extraocular movements intact.     Pupils: Pupils are equal, round, and reactive to light.  Cardiovascular:     Rate and Rhythm: Normal rate and regular rhythm.     Pulses: Normal pulses.     Heart sounds: Normal heart sounds.  Pulmonary:     Effort: Pulmonary effort is normal. No respiratory distress.     Breath sounds: Normal breath sounds.  Abdominal:     General: Bowel sounds are normal. There is no distension.     Palpations: Abdomen is soft.     Tenderness: There is no abdominal tenderness.  Musculoskeletal:        General: No swelling or deformity.  Skin:    General: Skin is warm and dry.  Neurological:     General: No focal deficit present.     Mental Status: Mental status is at baseline.   Labs on Admission: I have personally reviewed following labs and  imaging studies  CBC: Recent Labs  Lab 12/05/21 2134 12/05/21 2148  WBC 6.0  --   NEUTROABS 3.9  --   HGB 11.7* 11.9*  HCT 34.4* 35.0*  MCV 90.5  --   PLT 131*  --     Basic Metabolic Panel: Recent Labs  Lab 12/05/21 2134 12/05/21 2148  NA 131* 134*  K 3.8 3.9  CL 101 99  CO2 21*  --   GLUCOSE 622* 642*  BUN 29* 30*  CREATININE 1.95* 1.70*  CALCIUM 7.7*  --     GFR: Estimated Creatinine Clearance: 25.4 mL/min (A) (by C-G formula based on SCr of 1.7 mg/dL (H)).  Liver Function Tests: Recent Labs  Lab 12/05/21 2134  AST 47*  ALT 31  ALKPHOS 149*  BILITOT 0.7  PROT 5.9*  ALBUMIN 3.1*    Urine analysis:    Component Value Date/Time   COLORURINE STRAW (A) 12/05/2021 2233   APPEARANCEUR CLEAR 12/05/2021 2233   LABSPEC 1.011 12/05/2021 2233   LABSPEC 1.015 08/04/2014 1439   PHURINE 5.0 12/05/2021 2233   GLUCOSEU >=500 (A) 12/05/2021 2233   GLUCOSEU Negative 08/04/2014 1439   HGBUR SMALL (A) 12/05/2021 2233   HGBUR negative 10/10/2010 1607   BILIRUBINUR NEGATIVE 12/05/2021 2233   BILIRUBINUR Negative 08/04/2014 1439   KETONESUR NEGATIVE 12/05/2021 2233  PROTEINUR 30 (A) 12/05/2021 2233   UROBILINOGEN 0.2 08/04/2014 1439   NITRITE NEGATIVE 12/05/2021 2233   LEUKOCYTESUR NEGATIVE 12/05/2021 2233   LEUKOCYTESUR Negative 08/04/2014 1439    Radiological Exams on Admission: CT HEAD WO CONTRAST  Result Date: 12/05/2021 CLINICAL DATA:  Slurred speech. EXAM: CT HEAD WITHOUT CONTRAST TECHNIQUE: Contiguous axial images were obtained from the base of the skull through the vertex without intravenous contrast. COMPARISON:  November 21, 2017 FINDINGS: Brain: There is mild cerebral atrophy with widening of the extra-axial spaces and ventricular dilatation. There are areas of decreased attenuation within the white matter tracts of the supratentorial brain, consistent with microvascular disease changes. A stable 1.5 cm x 1.2 cm x 1.6 cm calcified meningioma is seen along  the anterior falx. Vascular: No hyperdense vessels are identified. Skull: Normal. Negative for fracture or focal lesion. Sinuses/Orbits: No acute finding. Other: None. IMPRESSION: 1. Generalized cerebral atrophy. 2. No acute intracranial abnormality. 3. Stable calcified meningioma along the anterior falx. Electronically Signed   By: Virgina Norfolk M.D.   On: 12/05/2021 22:18    EKG: Independently reviewed.  Sinus rhythm with significant sinus arrhythmia.  First-degree AV block.  Low voltage in multiple leads.  Assessment/Plan Principal Problem:   Seizure ([place]) Active Problems:   Hyperlipidemia   Essential hypertension   CAD (coronary artery disease)   PAF- flutter   History of TIA (transient ischemic attack)   AML (acute myeloblastic leukemia) (HCC)   Chronic combined systolic and diastolic heart failure (HCC)   Type 2 diabetes mellitus with hyperglycemia, with long-term current use of insulin (HCC)   Carcinoma of upper-outer quadrant of left breast in female, estrogen receptor positive ([place])   Acute renal failure superimposed on stage 4 chronic kidney disease (HCC)   Hyperglycemia  Seizure > Patient reporting several episodes of seizure-like activity at home and one witnessed in the ED. > Episodes consist of twitching of the right eye and lip with some drooling.  Resolved with Ativan in the ED. > Does have a remote history several years ago of seizure-like activity not on antiepileptics no recurrence till today. > Received Keppra load in the ED and neurology was consulted who will see the patient daily recommendations. - Appreciate neurology recommendations - Keppra 500 mg twice daily - EEG (will allow neurology to decide whether or to start with standard or monitored EEG) - Would recommend repeat Ativan for breakthrough seizures given her good response in the ED - Treat possible HHS as below. - MRI, given episodes of twitching and episodes of aphasia without twitching    Hyperglycemia ?  HHS > Patient noted to have significant hyperglycemia greater than 600 in the ED.  Possible HHS, serum osmolality is pending. > Started on insulin drip in the ED. Will discontinue some IVF as patient has hx of heart failure and does not appear significantly volume down. - Monitor on progressive unit - Continue with insulin drip - Received 20 mEq potassium in the ED - LR at 125 mL/hr until CBG less than 250 - Switch to D5-LR when 1 CBG less than 250 - CBG Q1H - Once glucose remains less than 250 for 2 checks, start CM diet and if able to eat, administer Lantus 10 units - Continue insulin drip for 1-2 more hours, then discontinue and start SSI-S  - DC fluids if eating, drinking, and off insulin drip  AKI on CKD 4 > Creatinine elevated to 1.95 from baseline around 1.5 > The setting of hyperglycemia and  possible HHS - Avoid nephrotoxic agents - Trend renal function electrolytes - Continue with IV fluids as above  CHF > Echo in 2021 with EF 55-60% and normal RV function. - Continue home metoprolol, torsemide   CAD - Continue home atorvastatin, Imdur, metoprolol, aspirin, Plavix  Hypertension - Continue home amlodipine, Imdur, metoprolol, torsemide   Hyperlipidemia - Continue home atorvastatin  History of TIA - Continue home aspirin, Plavix, atorvastatin  Elevated alk phos - History of this, stably elevated 149  Chronic incontinence Intermittent abdominal pain > Chronic issues, abdominal pain not active at this time. - Continue to monitor  History of AML and breast cancer - Noted  DVT prophylaxis: Lovenox Code Status:   Full  Family Communication:  Daughter updated at bedside Disposition Plan:   Patient is from:  Home  Anticipated DC to:  Home  Anticipated DC date:  1 to 2 days  Anticipated DC barriers: None  Consults called:  Neurology, consulted by EDP, will see the patient  Admission status:  Observation, progressive  Severity of  Illness: The appropriate patient status for this patient is OBSERVATION. Observation status is judged to be reasonable and necessary in order to provide the required intensity of service to ensure the patient's safety. The patient's presenting symptoms, physical exam findings, and initial radiographic and laboratory data in the context of their medical condition is felt to place them at decreased risk for further clinical deterioration. Furthermore, it is anticipated that the patient will be medically stable for discharge from the hospital within 2 midnights of admission.   Marcelyn Bruins MD [facility]  How to contact the [facility] Attending or Consulting provider [place] or covering provider during after hours [place], for this patient?   Check the care team in [facility] and look for a) attending/consulting [facility] provider listed and b) the [facility] team listed Log into [email or web address] and use [place]'s universal password to access. If you do not have the password, please contact the hospital operator. Locate the [facility] provider you are looking for under [facility] and page to a number that you can be directly reached. If you still have difficulty reaching the provider, please page the [facility] (Director on Call) for the Hospitalists listed on amion for assistance.  12/06/2021, 12:17 AM

## 2021-12-05 NOTE — Consult Note (Signed)
NEURO HOSPITALIST CONSULT NOTE   Requestig physician: Dr. Trilby Drummer  Reason for Consult: Breakthrough seizures  History obtained from:  Patient and Chart     HPI:                                                                                                                                          [name] is an 80 y.o. female with a PMHx of AML, uterine cancer, breast cancer, remote history of seizure-like activity (not on an anticonvulsant), arthritis, atrial flutter, cardiomyopathy, CKD2, COPD, CAD s/p coronary stent placement, HLD and HTN who presented to the ED Thursday night with episodic slurred speech which had been ongoing for the past week. She described 1 such spell on Wednesday and 2 additional spells on Thursday, one occurring on the way to the ED. Each episode lasted for several minutes. She also endorsed having elevated blood glucose levels. She describes the spells as sudden onset of slurred speech and difficulty "getting the words out". No associated incontinence, tongue biting or tonic-clonic activity.   While in the ED, at 9:20 PM, the patient had an acute episode of aphasia in conjunction with seizure-like activity involving her right eye and the right side of her lip with associated drooling, witnessed by EDP and RN. She was administered 1 mg Ativan. Keppra load of 1500 mg was then ordered.   CT head was unremarkable. Her blood glucose level came back markedly elevated at 600. EDP had concern for HHS as a possible etiology for her new onset seizures. She was started on IVF and insulin drip.   Past Medical History:  Diagnosis Date   acute myeloblastic leukemia (AML) dx'd 06/2014   Acute myelogenous leukemia ([facility]) 02/2014   Acute pancreatitis    Arthritis    "back" (09/17/2017)   Atrial flutter (H[facility])    Cardiomyopathy (H[facility])    Chronic kidney disease, stage II (mild)    Chronic lower back pain    Colon polyps 04/29/2010   TUBULAR ADENOMA AND A  SERRATED ADENOMA   COPD (chronic obstructive pulmonary disease) ([place])    CORONARY ARTERY DISEASE 12/24/2007   DIABETES MELLITUS, TYPE II 07/13/2007   Family history of cervical cancer    Family history of esophageal cancer    Family history of leukemia    Family history of lung cancer    Family history of skin cancer    History of blood transfusion 2015   "related to leukemia"   History of kidney stones 12/24/2007   History of uterine cancer    HYPERLIPIDEMIA 12/24/2007   HYPERTENSION 07/13/2007   NSTEMI (non-ST elevated myocardial infarction) ([place]) 09/17/2017   Unspecified disease of pancreas    Uterine cancer [facility])     Past Surgical History:  Procedure Laterality  Date   ABDOMINAL HYSTERECTOMY     "still have my ovaries"   BREAST BIOPSY Left 09/22/2019   BREAST LUMPECTOMY Left 10/24/2019   BREAST LUMPECTOMY WITH RADIOACTIVE SEED LOCALIZATION Left 10/24/2019   Procedure: LEFT BREAST LUMPECTOMY WITH RADIOACTIVE SEED LOCALIZATION;  Surgeon: Jovita Kussmaul, MD;  Location: [facility];  Service: General;  Laterality: Left;   CARDIAC CATHETERIZATION  2002   CARDIAC CATHETERIZATION  09/17/2017   CATARACT EXTRACTION W/ INTRAOCULAR LENS  IMPLANT, BILATERAL Bilateral    COLONOSCOPY W/ BIOPSIES AND POLYPECTOMY  2011   CORONARY STENT INTERVENTION N/A 09/21/2017   Procedure: CORONARY STENT INTERVENTION;  Surgeon: Troy Sine, MD;  Location: [place] [facility];  Service: Cardiovascular;  Laterality: N/A;   DILATION AND CURETTAGE OF UTERUS     EXCISIONAL HEMORRHOIDECTOMY  X 2   GANGLION CYST EXCISION Left X 3   INTRAVASCULAR PRESSURE WIRE/FFR STUDY N/A 09/17/2017   Procedure: INTRAVASCULAR PRESSURE WIRE/FFR STUDY;  Surgeon: Nelva Bush, MD;  Location: [facility] [facility];  Service: Cardiovascular;  Laterality: N/A;   NASAL SINUS SURGERY     PORTA CATH INSERTION Right 2013   RIGHT/LEFT HEART CATH AND CORONARY ANGIOGRAPHY N/A 09/17/2017   Procedure: RIGHT/LEFT HEART CATH AND  CORONARY ANGIOGRAPHY;  Surgeon: Nelva Bush, MD;  Location: [facility];  Service: Cardiovascular;  Laterality: N/A;   TUBAL LIGATION      Family History  Problem Relation Age of Onset   COPD Father    Esophageal cancer Father    Breast cancer Sister 66   Lung cancer Sister    Esophageal cancer Brother    Suicidality Brother    Lung cancer Sister    Lung cancer Sister    Cervical cancer Sister    Congestive Heart Failure Mother    Heart attack Maternal Grandmother    Skin cancer Maternal Grandfather    Heart attack Maternal Grandfather    Stroke Paternal Grandmother 46   Heart attack Paternal Grandfather    Cancer Maternal Uncle        unknown type, dx. >65   Cancer Paternal Aunt        unknown type, dx. >50   Cancer Paternal Uncle        unknown type, dx. >50   Cirrhosis Maternal Uncle    Skin cancer Daughter    Leukemia Cousin        maternal first cousin; dx. >50, in remission   Cancer Niece        unknown type behind her ear, dx. 30s/40s   Colon cancer Neg Hx               Social History:  reports that she quit smoking about 21 years ago. Her smoking use included cigarettes. She has a 20.00 pack-year smoking history. She has never used smokeless tobacco. She reports that she does not drink alcohol and does not use drugs.  No Known Allergies  HOME MEDICATIONS:  Current Facility-Administered Medications on File Prior to Encounter  Medication Dose Route Frequency Provider Last Rate Last Admin   sodium chloride 0.9 % 1,000 mL with magnesium sulfate 2 g infusion   Intravenous Continuous Ladell Pier, MD   Stopped at 08/04/14 1441   Current Outpatient Medications on File Prior to Encounter  Medication Sig Dispense Refill   amLODipine (NORVASC) 10 MG tablet Take 1 tablet (10 mg total) by mouth daily. 90 tablet 1   aspirin EC 81 MG tablet  Take 1 tablet (81 mg total) by mouth daily.     atorvastatin (LIPITOR) 40 MG tablet Take 1 tablet (40 mg total) by mouth daily. 90 tablet 1   Calcium Carbonate-Vit D-Min (CALTRATE 600+D PLUS MINERALS) 600-800 MG-UNIT TABS Take 1 tablet by mouth daily.     clopidogrel (PLAVIX) 75 MG tablet Take 1 tablet (75 mg total) by mouth every evening. 90 tablet 1   cyanocobalamin (,VITAMIN B-12,) 1000 MCG/ML injection INJECT 1ML IN THE MUSCLE EVERY 30 DAYS AS DIRECTED 3 mL 3   fluticasone furoate-vilanterol (BREO ELLIPTA) 200-25 MCG/INH AEPB Inhale 1 puff into the lungs daily. 120 each 5   HYDROcodone-acetaminophen (NORCO) 5-325 MG tablet Take 1 tablet by mouth every 6 (six) hours as needed for moderate pain. 120 tablet 0   insulin aspart (NOVOLOG FLEXPEN) 100 UNIT/ML FlexPen Max daily 60 units. Using sliding scale 30 mL 3   insulin glargine, 2 Unit Dial, (TOUJEO MAX SOLOSTAR) 300 UNIT/ML Solostar Pen Inject 16 Units into the skin at bedtime. 15 mL 1   isosorbide mononitrate (IMDUR) 30 MG 24 hr tablet Take one and half tab daliy 135 tablet 1   letrozole (FEMARA) 2.5 MG tablet Take 1 tablet by mouth daily.     LORazepam (ATIVAN) 0.5 MG tablet TAKE 1 TABLET BY MOUTH EVERY 8 HOURS AS NEEDED FOR ANXIETY(DO NOT EXCEED MORE THAN 2 TABS IN 24 HRS) (Patient taking differently: Take 0.5 mg by mouth every 8 (eight) hours as needed. TAKE 1 TABLET BY MOUTH EVERY 8 HOURS AS NEEDED FOR ANXIETY(DO NOT EXCEED MORE THAN 2 TABS IN 24 HRS)) 60 tablet 2   MAGNESIUM OXIDE 400 PO Take 400 mg by mouth 2 (two) times daily.     metoprolol succinate (TOPROL-XL) 100 MG 24 hr tablet Take 1 tablet (100 mg total) by mouth daily. 90 tablet 1   polyethylene glycol (MIRALAX / GLYCOLAX) 17 g packet as needed.     potassium chloride (KLOR-CON) 10 MEQ tablet Take 1 tablet (10 mEq total) by mouth daily. 90 tablet 1   promethazine (PHENERGAN) 12.5 MG tablet Take 2 tablets (25 mg total) by mouth every 6 (six) hours as needed for nausea. 12 tablet 0    torsemide (DEMADEX) 20 MG tablet Take 40 mg in the morning and 20 mg in the afternoon 270 tablet 3   Blood Glucose Monitoring Suppl (TRUE METRIX GO GLUCOSE METER) w/Device KIT 30 Units by Does not apply route daily. 1 kit 0   Continuous Blood Gluc Sensor (DEXCOM G6 SENSOR) MISC 1 Device by Does not apply route as directed. 9 each 3   Continuous Blood Gluc Transmit (DEXCOM G6 TRANSMITTER) MISC 1 Device by Does not apply route as directed. 1 each 3   COVID-19 mRNA Vac-TriS, Pfizer, (PFIZER-BIONT COVID-19 VAC-TRIS) SUSP injection Inject into the muscle. 0.3 mL 0   glucose blood (TRUE METRIX BLOOD GLUCOSE TEST) test strip TEST BLOOD SUGAR THREE TIMES DAILY 300 strip 2   HYDROcodone-acetaminophen (NORCO/VICODIN) 5-325 MG  tablet Take 1 tablet by mouth every 6 (six) hours as needed for moderate pain. 120 tablet 0   HYDROcodone-acetaminophen (NORCO/VICODIN) 5-325 MG tablet Take 1 tablet by mouth every 6 (six) hours as needed for moderate pain. 120 tablet 0   Insulin Pen Needle 32G X 4 MM MISC 1 Device by Does not apply route in the morning, at noon, in the evening, and at bedtime. 400 each 3   letrozole (FEMARA) 2.5 MG tablet Take 1 tablet (2.5 mg total) by mouth daily. 90 tablet 3   nitroGLYCERIN (NITROSTAT) 0.4 MG SL tablet PLACE 1 TABLET (0.4 MG TOTAL) UNDER THE TONGUE EVERY 5 (FIVE) MINUTES AS NEEDED FOR CHEST PAIN. (Patient not taking: Reported on 12/06/2021) 25 tablet 0   SYRINGE-NEEDLE, DISP, 3 ML (BD SAFETYGLIDE SYRINGE/NEEDLE) 25G X 1" 3 ML MISC Use for B12 injections 100 each 11     ROS:                                                                                                                                       No CP, SOB, N/V, weakness or abdominal pain. No fever or chills. Other ROS as per HPI.     Blood pressure (!) 139/51, pulse 70, temperature 98.7 F (37.1 C), temperature source Oral, resp. rate 20, SpO2 99 %.   General Examination:                                                                                                        Physical Exam  HEENT-  [place]/AT    Lungs- Respirations unlabored Extremities- Warm and well perfused  Neurological Examination Mental Status: Awake and alert. Patient has ongoing speech deficit during exam which started just before examiner entered the room. Speech varies from transiently fluent to halting, with phonemic paraphasias and at times garbled unrecognizable words. When asked to name objects, she gives clear correct answers for some items but for others with halting/stuttering utterances and phonemic paraphasias. Repetition is impaired. Mild dysarthria. Comprehension is intact. Oriented x 5.  Cranial Nerves: II: Temporal visual fields intact with no extinction to DSS. PERRL.  III,IV, VI: No ptosis. EOMI.  V: Temp sensation intact bilaterally VII: Smile symmetric VIII: Hearing intact to conversation IX,X: No hypophonia XI: Head is midline XII: Midline tongue extension Motor: Right : Upper extremity   5/5    Left:     Upper extremity   5/5  Lower extremity   5/5  Lower extremity   5/5 No pronator drift Sensory: Temp sensation equal in BUE and BLE. FT intact x 4. Positive for extinction to DSS on the right.   Deep Tendon Reflexes: 2+ right brachioradialis and biceps. 3+ left biceps and brachioradialis. 2+ bilateral patellae. 0 bilateral achilles. Cerebellar: No ataxia with FNF bilaterally Gait: Deferred   Lab Results: Basic Metabolic Panel: Recent Labs  Lab 12/05/21 2134 12/05/21 2148  NA 131* 134*  K 3.8 3.9  CL 101 99  CO2 21*  --   GLUCOSE 622* 642*  BUN 29* 30*  CREATININE 1.95* 1.70*  CALCIUM 7.7*  --     CBC: Recent Labs  Lab 12/05/21 2134 12/05/21 2148  WBC 6.0  --   NEUTROABS 3.9  --   HGB 11.7* 11.9*  HCT 34.4* 35.0*  MCV 90.5  --   PLT 131*  --     Cardiac Enzymes: No results for input(s): CKTOTAL, CKMB, CKMBINDEX, TROPONINI in the last 168 hours.  Lipid Panel: No results for  input(s): CHOL, TRIG, HDL, CHOLHDL, VLDL, LDLCALC in the last 168 hours.  Imaging: CT HEAD WO CONTRAST  Result Date: 12/05/2021 CLINICAL DATA:  Slurred speech. EXAM: CT HEAD WITHOUT CONTRAST TECHNIQUE: Contiguous axial images were obtained from the base of the skull through the vertex without intravenous contrast. COMPARISON:  November 21, 2017 FINDINGS: Brain: There is mild cerebral atrophy with widening of the extra-axial spaces and ventricular dilatation. There are areas of decreased attenuation within the white matter tracts of the supratentorial brain, consistent with microvascular disease changes. A stable 1.5 cm x 1.2 cm x 1.6 cm calcified meningioma is seen along the anterior falx. Vascular: No hyperdense vessels are identified. Skull: Normal. Negative for fracture or focal lesion. Sinuses/Orbits: No acute finding. Other: None. IMPRESSION: 1. Generalized cerebral atrophy. 2. No acute intracranial abnormality. 3. Stable calcified meningioma along the anterior falx. Electronically Signed   By: Virgina Norfolk M.D.   On: 12/05/2021 22:18     Assessment: 80 year old female presenting with 5 seizures today. She has a remote history of seizure-like activity. Not on anticonvulsant medication at home. Seizures may be due to HHS given markedly elevated blood glucose of 600. 1. Exam reveals expressive dysphasia with frequent phonemic paraphasias and mild dysarthria. This occurred despite correction of her CBG to < 200 overnight.  2. CT head: Generalized cerebral atrophy. No acute intracranial abnormality. Stable calcified meningioma along the anterior falx, chronically indenting the left > right medial aspects of the anterior frontal lobes inferiorly. 3. Underlying etiology for her seizures may cortical irritation secondary to the meningioma, focal cerebral involvement by solid tumor not visualizable on CT (of note, AML can be associated with a solid tumor manifestation known as extramedullary acute  myeloid leukemia tumor, which can be found at multiple anatomical sites, including the brain), versus simple partial seizures triggered by severe hyperglycemia.  4. Has been loaded with Keppra 1500 mg. Scheduled Keppra 500 mg PO BID was ordered as well.   Recommendations: 1. Agree with loading Keppra 1500 mg. Start scheduled Keppra at 500 mg PO BID.   2. MRI brain without contrast unless her renal function improves. If eGFR goes above 40, consider MRI with contrast. Also obtain MRA head.  3. LTM EEG (ordered).  4. Inpatient seizure precautions.  5. Outpatient seizure precautions: Per [facility] [place] statutes, patients with seizures are not allowed to drive until  they have been seizure-free for six months. Use caution when using heavy equipment  or power tools. Avoid working on ladders or at heights. Take showers instead of baths. Ensure the water temperature is not too high on the home water heater. Do not go swimming alone. When caring for infants or small children, sit down when holding, feeding, or changing them to minimize risk of injury to the child in the event you have a seizure. Also, Maintain good sleep hygiene. Avoid alcohol. 6. Ativan 2 mg IV PRN seizure lasting > 2 minutes.     Electronically signed: Dr. Kerney Elbe 12/05/2021, 10:30 PM

## 2021-12-05 NOTE — ED Provider Notes (Signed)
Emergency Medicine Provider Triage Evaluation Note  [name] , [name] 80 y.o. female  was evaluated in triage.  Pt complains of intermittent slurred speech ongoing 1 week.  Family member in the room who reports patient had similar episode yesterday however was not evaluated for his symptoms.  Family member in the room reports patient went to work today without experiencing similar symptoms.  Patient had 2 episodes of slurred speech today.  Patient also reports that she has episodes where her speech is slurred and she is trying to get her words out but she is unable to.  Denies prior history of seizures.  Patient's not trying medications for her symptoms.  She has [name] history of diabetes and takes insulin, she has not been checking her sugars.  She denies weakness, chest pain, shortness of breath, abdominal pain, nausea, vomiting, fever, chills.   Review of Systems  Positive: Aphasia, twitching, drooling Negative: Chest pain, shortness of breath  Physical Exam  BP (!) 147/62    Pulse 76    Temp 99.3 F (37.4 C) (Oral)    Resp 17    SpO2 99%  Gen:   Awake, no distress   Resp:  Normal effort  MSK:   Moves extremities without difficulty  Other:  No focal neurological deficits.  Negative pronator drift.  Strength and sensation intact to bilateral upper and lower extremities.  Normal finger-to-nose testing.  Right eye and right sided lip twitching with drooling.  Aphasia noted.  Medical Decision Making  Medically screening exam initiated at 9:34 PM.  Appropriate orders placed.  HAEVEN [name] was informed that the remainder of the evaluation will be completed by another provider, this initial triage assessment does not replace that evaluation, and the importance of remaining in the ED until their evaluation is complete.  9:20 PM - Patient with acute episode of aphasia, seizure-like activity involving right eye and right sided lip with associated drooling towards the end of my physical exam.  This activity  was witnessed by both myself and the RN.  9:25 PM - Attending Dr. Regenia Skeeter consulted and evaluated patient in triage.  Agrees with work-up.  Will give 1 g Ativan and monitor in triage prior to getting patient [name] room.  9:36 PM - Discussed with RN that patient is in need of [name] room immediately. RN aware and working on room placement.     Catherine Crago A, PA-C 12/05/21 2140    Catherine Gambler, MD 12/05/21 2234

## 2021-12-05 NOTE — ED Triage Notes (Signed)
Pt here via POV with "episodes " of slurred speech. Pt states it began going on for one week. Pt states she had 2 episodes today one of which was on the way here. Endorses high sugar levels. Denies CP and dizziness.

## 2021-12-06 ENCOUNTER — Observation Stay (HOSPITAL_COMMUNITY): Payer: Medicare HMO

## 2021-12-06 ENCOUNTER — Encounter (HOSPITAL_COMMUNITY): Payer: Self-pay | Admitting: Internal Medicine

## 2021-12-06 ENCOUNTER — Other Ambulatory Visit: Payer: Self-pay

## 2021-12-06 DIAGNOSIS — E876 Hypokalemia: Secondary | ICD-10-CM | POA: Diagnosis not present

## 2021-12-06 DIAGNOSIS — K7460 Unspecified cirrhosis of liver: Secondary | ICD-10-CM | POA: Diagnosis present

## 2021-12-06 DIAGNOSIS — K8689 Other specified diseases of pancreas: Secondary | ICD-10-CM | POA: Diagnosis not present

## 2021-12-06 DIAGNOSIS — I5042 Chronic combined systolic (congestive) and diastolic (congestive) heart failure: Secondary | ICD-10-CM | POA: Diagnosis not present

## 2021-12-06 DIAGNOSIS — N184 Chronic kidney disease, stage 4 (severe): Secondary | ICD-10-CM | POA: Diagnosis present

## 2021-12-06 DIAGNOSIS — K8020 Calculus of gallbladder without cholecystitis without obstruction: Secondary | ICD-10-CM | POA: Diagnosis not present

## 2021-12-06 DIAGNOSIS — N179 Acute kidney failure, unspecified: Secondary | ICD-10-CM

## 2021-12-06 DIAGNOSIS — R932 Abnormal findings on diagnostic imaging of liver and biliary tract: Secondary | ICD-10-CM | POA: Diagnosis not present

## 2021-12-06 DIAGNOSIS — D631 Anemia in chronic kidney disease: Secondary | ICD-10-CM | POA: Diagnosis present

## 2021-12-06 DIAGNOSIS — I480 Paroxysmal atrial fibrillation: Secondary | ICD-10-CM | POA: Diagnosis present

## 2021-12-06 DIAGNOSIS — R4702 Dysphasia: Secondary | ICD-10-CM | POA: Diagnosis present

## 2021-12-06 DIAGNOSIS — E1100 Type 2 diabetes mellitus with hyperosmolarity without nonketotic hyperglycemic-hyperosmolar coma (NKHHC): Secondary | ICD-10-CM | POA: Diagnosis present

## 2021-12-06 DIAGNOSIS — I25118 Atherosclerotic heart disease of native coronary artery with other forms of angina pectoris: Secondary | ICD-10-CM | POA: Diagnosis not present

## 2021-12-06 DIAGNOSIS — C9201 Acute myeloblastic leukemia, in remission: Secondary | ICD-10-CM | POA: Diagnosis present

## 2021-12-06 DIAGNOSIS — R739 Hyperglycemia, unspecified: Secondary | ICD-10-CM

## 2021-12-06 DIAGNOSIS — I252 Old myocardial infarction: Secondary | ICD-10-CM | POA: Diagnosis not present

## 2021-12-06 DIAGNOSIS — R4781 Slurred speech: Secondary | ICD-10-CM | POA: Diagnosis not present

## 2021-12-06 DIAGNOSIS — I5032 Chronic diastolic (congestive) heart failure: Secondary | ICD-10-CM | POA: Diagnosis present

## 2021-12-06 DIAGNOSIS — D320 Benign neoplasm of cerebral meninges: Secondary | ICD-10-CM | POA: Diagnosis present

## 2021-12-06 DIAGNOSIS — E871 Hypo-osmolality and hyponatremia: Secondary | ICD-10-CM | POA: Diagnosis present

## 2021-12-06 DIAGNOSIS — E860 Dehydration: Secondary | ICD-10-CM | POA: Diagnosis present

## 2021-12-06 DIAGNOSIS — I130 Hypertensive heart and chronic kidney disease with heart failure and stage 1 through stage 4 chronic kidney disease, or unspecified chronic kidney disease: Secondary | ICD-10-CM | POA: Diagnosis present

## 2021-12-06 DIAGNOSIS — Z79811 Long term (current) use of aromatase inhibitors: Secondary | ICD-10-CM | POA: Diagnosis not present

## 2021-12-06 DIAGNOSIS — N281 Cyst of kidney, acquired: Secondary | ICD-10-CM | POA: Diagnosis not present

## 2021-12-06 DIAGNOSIS — E785 Hyperlipidemia, unspecified: Secondary | ICD-10-CM | POA: Diagnosis present

## 2021-12-06 DIAGNOSIS — K862 Cyst of pancreas: Secondary | ICD-10-CM | POA: Diagnosis present

## 2021-12-06 DIAGNOSIS — I10 Essential (primary) hypertension: Secondary | ICD-10-CM | POA: Diagnosis not present

## 2021-12-06 DIAGNOSIS — R933 Abnormal findings on diagnostic imaging of other parts of digestive tract: Secondary | ICD-10-CM | POA: Diagnosis not present

## 2021-12-06 DIAGNOSIS — K7689 Other specified diseases of liver: Secondary | ICD-10-CM | POA: Diagnosis not present

## 2021-12-06 DIAGNOSIS — E1122 Type 2 diabetes mellitus with diabetic chronic kidney disease: Secondary | ICD-10-CM | POA: Diagnosis present

## 2021-12-06 DIAGNOSIS — K869 Disease of pancreas, unspecified: Secondary | ICD-10-CM | POA: Diagnosis not present

## 2021-12-06 DIAGNOSIS — Z20822 Contact with and (suspected) exposure to covid-19: Secondary | ICD-10-CM | POA: Diagnosis present

## 2021-12-06 DIAGNOSIS — E1165 Type 2 diabetes mellitus with hyperglycemia: Secondary | ICD-10-CM | POA: Diagnosis present

## 2021-12-06 DIAGNOSIS — R569 Unspecified convulsions: Secondary | ICD-10-CM | POA: Diagnosis present

## 2021-12-06 DIAGNOSIS — D696 Thrombocytopenia, unspecified: Secondary | ICD-10-CM | POA: Diagnosis present

## 2021-12-06 DIAGNOSIS — I2510 Atherosclerotic heart disease of native coronary artery without angina pectoris: Secondary | ICD-10-CM | POA: Diagnosis present

## 2021-12-06 LAB — CBC
HCT: 30.3 % — ABNORMAL LOW (ref 36.0–46.0)
Hemoglobin: 10.4 g/dL — ABNORMAL LOW (ref 12.0–15.0)
MCH: 31.2 pg (ref 26.0–34.0)
MCHC: 34.3 g/dL (ref 30.0–36.0)
MCV: 91 fL (ref 80.0–100.0)
Platelets: 111 10*3/uL — ABNORMAL LOW (ref 150–400)
RBC: 3.33 MIL/uL — ABNORMAL LOW (ref 3.87–5.11)
RDW: 12.2 % (ref 11.5–15.5)
WBC: 5.2 10*3/uL (ref 4.0–10.5)
nRBC: 0 % (ref 0.0–0.2)

## 2021-12-06 LAB — COMPREHENSIVE METABOLIC PANEL
ALT: 26 U/L (ref 0–44)
AST: 29 U/L (ref 15–41)
Albumin: 2.7 g/dL — ABNORMAL LOW (ref 3.5–5.0)
Alkaline Phosphatase: 122 U/L (ref 38–126)
Anion gap: 6 (ref 5–15)
BUN: 27 mg/dL — ABNORMAL HIGH (ref 8–23)
CO2: 24 mmol/L (ref 22–32)
Calcium: 7.7 mg/dL — ABNORMAL LOW (ref 8.9–10.3)
Chloride: 106 mmol/L (ref 98–111)
Creatinine, Ser: 1.63 mg/dL — ABNORMAL HIGH (ref 0.44–1.00)
GFR, Estimated: 32 mL/min — ABNORMAL LOW (ref >60)
Glucose, Bld: 157 mg/dL — ABNORMAL HIGH (ref 70–99)
Potassium: 3.2 mmol/L — ABNORMAL LOW (ref 3.5–5.1)
Sodium: 136 mmol/L (ref 135–145)
Total Bilirubin: 0.7 mg/dL (ref 0.3–1.2)
Total Protein: 5 g/dL — ABNORMAL LOW (ref 6.5–8.1)

## 2021-12-06 LAB — CBG MONITORING, ED
Glucose-Capillary: 141 mg/dL — ABNORMAL HIGH (ref 70–99)
Glucose-Capillary: 142 mg/dL — ABNORMAL HIGH (ref 70–99)
Glucose-Capillary: 151 mg/dL — ABNORMAL HIGH (ref 70–99)
Glucose-Capillary: 156 mg/dL — ABNORMAL HIGH (ref 70–99)
Glucose-Capillary: 172 mg/dL — ABNORMAL HIGH (ref 70–99)
Glucose-Capillary: 211 mg/dL — ABNORMAL HIGH (ref 70–99)
Glucose-Capillary: 222 mg/dL — ABNORMAL HIGH (ref 70–99)
Glucose-Capillary: 361 mg/dL — ABNORMAL HIGH (ref 70–99)
Glucose-Capillary: 90 mg/dL (ref 70–99)

## 2021-12-06 LAB — GLUCOSE, CAPILLARY
Glucose-Capillary: 258 mg/dL — ABNORMAL HIGH (ref 70–99)
Glucose-Capillary: 360 mg/dL — ABNORMAL HIGH (ref 70–99)

## 2021-12-06 LAB — MAGNESIUM: Magnesium: 1.9 mg/dL (ref 1.7–2.4)

## 2021-12-06 LAB — HEMOGLOBIN A1C
Hgb A1c MFr Bld: 11.3 % — ABNORMAL HIGH (ref 4.8–5.6)
Mean Plasma Glucose: 277.61 mg/dL

## 2021-12-06 LAB — OSMOLALITY: Osmolality: 292 mOsm/kg (ref 275–295)

## 2021-12-06 MED ORDER — HYDROCODONE-ACETAMINOPHEN 5-325 MG PO TABS
1.0000 | ORAL_TABLET | Freq: Four times a day (QID) | ORAL | Status: DC | PRN
  Administered 2021-12-06 – 2021-12-08 (×2): 1 via ORAL
  Filled 2021-12-06 (×2): qty 1

## 2021-12-06 MED ORDER — INSULIN ASPART 100 UNIT/ML IJ SOLN
0.0000 [IU] | Freq: Every day | INTRAMUSCULAR | Status: DC
  Administered 2021-12-06: 23:00:00 5 [IU] via SUBCUTANEOUS

## 2021-12-06 MED ORDER — ISOSORBIDE MONONITRATE ER 30 MG PO TB24
45.0000 mg | ORAL_TABLET | Freq: Every day | ORAL | Status: DC
  Administered 2021-12-06 – 2021-12-09 (×4): 45 mg via ORAL
  Filled 2021-12-06 (×5): qty 2

## 2021-12-06 MED ORDER — LORAZEPAM 2 MG/ML IJ SOLN
2.0000 mg | Freq: Four times a day (QID) | INTRAMUSCULAR | Status: DC | PRN
  Administered 2021-12-07: 2 mg via INTRAVENOUS
  Filled 2021-12-06 (×2): qty 1

## 2021-12-06 MED ORDER — TORSEMIDE 20 MG PO TABS
40.0000 mg | ORAL_TABLET | Freq: Every day | ORAL | Status: DC
  Administered 2021-12-06 – 2021-12-08 (×3): 40 mg via ORAL
  Filled 2021-12-06 (×3): qty 2

## 2021-12-06 MED ORDER — INSULIN ASPART 100 UNIT/ML IJ SOLN
0.0000 [IU] | Freq: Three times a day (TID) | INTRAMUSCULAR | Status: DC
  Administered 2021-12-06: 14:00:00 5 [IU] via SUBCUTANEOUS
  Administered 2021-12-06: 17:00:00 8 [IU] via SUBCUTANEOUS
  Administered 2021-12-07: 3 [IU] via SUBCUTANEOUS
  Administered 2021-12-07: 5 [IU] via SUBCUTANEOUS
  Administered 2021-12-07: 3 [IU] via SUBCUTANEOUS
  Administered 2021-12-08: 5 [IU] via SUBCUTANEOUS
  Administered 2021-12-09 (×2): 3 [IU] via SUBCUTANEOUS
  Administered 2021-12-09: 5 [IU] via SUBCUTANEOUS

## 2021-12-06 MED ORDER — LEVETIRACETAM 500 MG PO TABS
500.0000 mg | ORAL_TABLET | Freq: Two times a day (BID) | ORAL | Status: DC
  Administered 2021-12-06 – 2021-12-07 (×3): 500 mg via ORAL
  Filled 2021-12-06 (×3): qty 1

## 2021-12-06 MED ORDER — INSULIN GLARGINE-YFGN 100 UNIT/ML ~~LOC~~ SOLN
20.0000 [IU] | Freq: Every day | SUBCUTANEOUS | Status: DC
  Administered 2021-12-06 – 2021-12-09 (×4): 20 [IU] via SUBCUTANEOUS
  Filled 2021-12-06 (×4): qty 0.2

## 2021-12-06 MED ORDER — TORSEMIDE 20 MG PO TABS
20.0000 mg | ORAL_TABLET | Freq: Every day | ORAL | Status: DC
  Administered 2021-12-06 – 2021-12-07 (×2): 20 mg via ORAL
  Filled 2021-12-06 (×2): qty 1

## 2021-12-06 MED ORDER — POTASSIUM CHLORIDE CRYS ER 20 MEQ PO TBCR
40.0000 meq | EXTENDED_RELEASE_TABLET | Freq: Once | ORAL | Status: AC
  Administered 2021-12-06: 08:00:00 40 meq via ORAL
  Filled 2021-12-06: qty 2

## 2021-12-06 NOTE — Progress Notes (Signed)
EEG done at bedside. No skin breakdown noted. Results pending. 

## 2021-12-06 NOTE — ED Notes (Signed)
Bolus changed to 568mL LR per MD

## 2021-12-06 NOTE — ED Notes (Signed)
Assisted up to the bathroom, strong steady gait.

## 2021-12-06 NOTE — Progress Notes (Signed)
PROGRESS NOTE  PABLO MATHURIN ZJI:[id number] DOB: 09/09/1941 DOA: 12/05/2021 PCP: Isaac Bliss, Rayford Halsted, MD   LOS: 0 days   Brief Narrative / Interim history: 80 year old female with history of leukemia in remission, diastolic CHF, breast cancer, hypertension, hyperlipidemia, chronic kidney disease stage IV, DM 2, comes into the hospital with episodes of seizure-like activity.  Patient presented with several episodes of slurred speech that lasted several minutes, unable to speak clearly.  She was also noted to have right thigh and right lip twitching and some drooling from the right side of the mouth.  Neurology consulted after admission.  She was also seen to have significant hyperglycemia and was started on insulin drip  Subjective / 24h Interval events: She is doing well this morning.  No chest pain, no shortness of breath.  While I was talking to her started having difficulties getting her words out.  Remains alert throughout the episode.  Assessment & Plan: Principal Problem Concern for seizure activity-neurology consulted and following.  Episodes at home-in the setting of profound hypoglycemia but she had recurrent episode this morning when her CBG was in the mid 100s.  She has been placed on Keppra, continuous EEG monitoring currently  Active Problems DM2, with hyperglycemia, HHS-CBGs improved significantly this morning.  Discontinue insulin drip, start patient on glargine as well as NovoLog with meals.  Continue to closely monitor CBGs  Meningioma -MRI of the brain showed a 17 mm anterior falcine meningioma with local cortical mass-effect but no brain edema.  This is increased since prior MRI few years ago.  Neurosurgery consulted.  Acute kidney injury on chronic kidney disease stage IV-Baseline creatinine around 1.5.  Creatinine was 1.95 on admission, likely in the setting of dehydration due to HHS.  Currently creatinine 1.6, improving.  Hypokalemia-replete  Chronic diastolic  CHF-appears euvolemic, discontinue fluids this morning  Coronary artery disease-continue home regimen, no pain  Hypertension-continue home regimen  Hyperlipidemia-continue home regimen  History of TIA-continue aspirin, Plavix, statin  Intermittent abdominal pain, diarrhea-she reports significant diarrhea over the past couple years.  Never saw GI.  She has a history of C diff.  Obtain repeat C. difficile  History of AML and breast cancer- Noted  Scheduled Meds:  amLODipine  10 mg Oral Daily   aspirin EC  81 mg Oral Daily   atorvastatin  40 mg Oral Daily   Chlorhexidine Gluconate Cloth  6 each Topical Daily   enoxaparin (LOVENOX) injection  30 mg Subcutaneous Q24H   fluticasone furoate-vilanterol  1 puff Inhalation Daily   insulin aspart  0-15 Units Subcutaneous TID WC   insulin aspart  0-5 Units Subcutaneous QHS   insulin glargine-yfgn  20 Units Subcutaneous Daily   isosorbide mononitrate  45 mg Oral Daily   letrozole  2.5 mg Oral Daily   levETIRAcetam  500 mg Oral BID   metoprolol succinate  100 mg Oral Daily   sodium chloride flush  10-40 mL Intracatheter Q12H   sodium chloride flush  3 mL Intravenous Once   sodium chloride flush  3 mL Intravenous Q12H   torsemide  20 mg Oral Q supper   torsemide  40 mg Oral Q breakfast   Continuous Infusions: PRN Meds:.acetaminophen **OR** acetaminophen, dextrose, LORazepam, polyethylene glycol, sodium chloride flush  Diet Orders (From admission, onward)     Start     Ordered   12/06/21 0650  Diet heart healthy/carb modified Room service appropriate? Yes; Fluid consistency: Thin  Diet effective now  Question Answer Comment  Diet-HS Snack? Nothing   Room service appropriate? Yes   Fluid consistency: Thin      12/06/21 0650            DVT prophylaxis: enoxaparin (LOVENOX) injection 30 mg Start: 12/06/21 1000     Code Status: Full Code  Family Communication: no family at bedside   Status is: Observation  The patient  will require care spanning > 2 midnights and should be moved to inpatient because: continuous EEG  Level of care: Progressive  Consultants:  Neurology   Procedures:  none  Microbiology  none  Antimicrobials: none    Objective: Vitals:   12/06/21 0330 12/06/21 0400 12/06/21 0415 12/06/21 0700  BP: (!) 121/41 (!) 121/41 135/61 (!) 147/75  Pulse: 65 69 69 77  Resp: 14 18 15 20   Temp:  98.1 F (36.7 C)    TempSrc:  Oral    SpO2: 98% 99% 99% 98%  Weight:      Height:       No intake or output data in the 24 hours ending 12/06/21 1052 Filed Weights   12/05/21 2332  Weight: 74.8 kg    Examination:  Constitutional: NAD Eyes: no scleral icterus ENMT: Mucous membranes are moist.  Neck: normal, supple Respiratory: clear to auscultation bilaterally, no wheezing, no crackles. Normal respiratory effort.  Cardiovascular: Regular rate and rhythm, no murmurs / rubs / gallops. No LE edema.  Abdomen: non distended, no tenderness. Bowel sounds positive.  Musculoskeletal: no clubbing / cyanosis.  Skin: no rashes Neurologic: CN 2-12 grossly intact. Strength 5/5 in all 4.   Data Reviewed: I have independently reviewed following labs and imaging studies   CBC: Recent Labs  Lab 12/05/21 2134 12/05/21 2148 12/06/21 0405  WBC 6.0  --  5.2  NEUTROABS 3.9  --   --   HGB 11.7* 11.9* 10.4*  HCT 34.4* 35.0* 30.3*  MCV 90.5  --  91.0  PLT 131*  --  161*   Basic Metabolic Panel: Recent Labs  Lab 12/05/21 2134 12/05/21 2148 12/06/21 0405  NA 131* 134* 136  K 3.8 3.9 3.2*  CL 101 99 106  CO2 21*  --  24  GLUCOSE 622* 642* 157*  BUN 29* 30* 27*  CREATININE 1.95* 1.70* 1.63*  CALCIUM 7.7*  --  7.7*  MG  --   --  1.9   Liver Function Tests: Recent Labs  Lab 12/05/21 2134 12/06/21 0405  AST 47* 29  ALT 31 26  ALKPHOS 149* 122  BILITOT 0.7 0.7  PROT 5.9* 5.0*  ALBUMIN 3.1* 2.7*   Coagulation Profile: Recent Labs  Lab 12/05/21 2134  INR 1.1   HbA1C: No results  for input(s): HGBA1C in the last 72 hours. CBG: Recent Labs  Lab 12/06/21 0423 12/06/21 0503 12/06/21 0960 12/06/21 0733 12/06/21 0848  GLUCAP 151* 141* 142* 90 172*    Recent Results (from the past 240 hour(s))  Resp Panel by RT-PCR (Flu A&B, Covid) Nasopharyngeal Swab     Status: None   Collection Time: 12/05/21  9:48 PM   Specimen: Nasopharyngeal Swab; Nasopharyngeal(NP) swabs in vial transport medium  Result Value Ref Range Status   SARS Coronavirus 2 by RT PCR NEGATIVE NEGATIVE Final    Comment: (NOTE) SARS-CoV-2 target nucleic acids are NOT DETECTED.  The SARS-CoV-2 RNA is generally detectable in upper respiratory specimens during the acute phase of infection. The lowest concentration of SARS-CoV-2 viral copies this assay can detect is 138  copies/mL. A negative result does not preclude SARS-Cov-2 infection and should not be used as the sole basis for treatment or other patient management decisions. A negative result may occur with  improper specimen collection/handling, submission of specimen other than nasopharyngeal swab, presence of viral mutation(s) within the areas targeted by this assay, and inadequate number of viral copies(<138 copies/mL). A negative result must be combined with clinical observations, patient history, and epidemiological information. The expected result is Negative.  Fact Sheet for Patients:  EntrepreneurPulse.com.au  Fact Sheet for Healthcare Providers:  IncredibleEmployment.be  This test is no t yet approved or cleared by the [place] FDA and  has been authorized for detection and/or diagnosis of SARS-CoV-2 by FDA under an Emergency Use Authorization (EUA). This EUA will remain  in effect (meaning this test can be used) for the duration of the COVID-19 declaration under Section 564(b)(1) of the Act, 21 U.S.C.section 360bbb-3(b)(1), unless the authorization is terminated  or revoked sooner.        Influenza A by PCR NEGATIVE NEGATIVE Final   Influenza B by PCR NEGATIVE NEGATIVE Final    Comment: (NOTE) The Xpert Xpress SARS-CoV-2/FLU/RSV plus assay is intended as an aid in the diagnosis of influenza from Nasopharyngeal swab specimens and should not be used as a sole basis for treatment. Nasal washings and aspirates are unacceptable for Xpert Xpress SARS-CoV-2/FLU/RSV testing.  Fact Sheet for Patients: EntrepreneurPulse.com.au  Fact Sheet for Healthcare Providers: IncredibleEmployment.be  This test is not yet approved or cleared by the [place] FDA and has been authorized for detection and/or diagnosis of SARS-CoV-2 by FDA under an Emergency Use Authorization (EUA). This EUA will remain in effect (meaning this test can be used) for the duration of the COVID-19 declaration under Section 564(b)(1) of the Act, 21 U.S.C. section 360bbb-3(b)(1), unless the authorization is terminated or revoked.  Performed at [facility], [place] [address] [place], [place] [address]      Radiology Studies: CT HEAD WO CONTRAST  Result Date: 12/05/2021 CLINICAL DATA:  Slurred speech. EXAM: CT HEAD WITHOUT CONTRAST TECHNIQUE: Contiguous axial images were obtained from the base of the skull through the vertex without intravenous contrast. COMPARISON:  November 21, 2017 FINDINGS: Brain: There is mild cerebral atrophy with widening of the extra-axial spaces and ventricular dilatation. There are areas of decreased attenuation within the white matter tracts of the supratentorial brain, consistent with microvascular disease changes. A stable 1.5 cm x 1.2 cm x 1.6 cm calcified meningioma is seen along the anterior falx. Vascular: No hyperdense vessels are identified. Skull: Normal. Negative for fracture or focal lesion. Sinuses/Orbits: No acute finding. Other: None. IMPRESSION: 1. Generalized cerebral atrophy. 2. No acute intracranial abnormality. 3. Stable  calcified meningioma along the anterior falx. Electronically Signed   By: Virgina Norfolk M.D.   On: 12/05/2021 22:18   MR BRAIN WO CONTRAST  Result Date: 12/06/2021 CLINICAL DATA:  Neuro deficit with acute stroke suspected. Transient slurred speech. Possible seizure like symptoms. EXAM: MRI HEAD WITHOUT CONTRAST TECHNIQUE: Multiplanar, multiecho pulse sequences of the brain and surrounding structures were obtained without intravenous contrast. COMPARISON:  Head CT from yesterday FINDINGS: Brain: No acute infarction, hemorrhage, hydrocephalus, extra-axial collection or mass effect. Anterior falx meningioma measuring 17 mm. Local cortical mass effect without brain edema. Chronic small vessel ischemia in the deep white matter, mild to moderate for age. Age normal brain volume Vascular: Normal flow voids Skull and upper cervical spine: Normal marrow signal Sinuses/Orbits: Bilateral cataract resection. IMPRESSION: 1.  Aging brain without acute finding or explanation for symptoms. 2. 17 mm anterior falcine meningioma.  No adjacent brain edema. Electronically Signed   By: Jorje Guild M.D.   On: 12/06/2021 05:54     Marzetta Board, MD, PhD [facility]  Between 7 am - 7 pm I am available, please contact me via Amion (for emergencies) or Securechat (non urgent messages)  Between 7 pm - 7 am I am not available, please contact night coverage MD/APP via Amion

## 2021-12-06 NOTE — Progress Notes (Signed)
°  Transition of Care [facility]) Screening Note   Patient Details  Name: MONQUIE FULGHAM Date of Birth: 08-27-1941   [facility]) CM/SW Contact:    Pollie Friar, RN Phone Number: 12/06/2021, 3:50 PM    Transition of Care Department [facility]) has reviewed patient. We will continue to monitor patient advancement through interdisciplinary progression rounds. If new patient transition needs arise, please place a TOC consult.

## 2021-12-06 NOTE — Consult Note (Addendum)
Neurosurgery Consultation  Reason for Consult: new brain mass, seizure Referring Physician: Trilby Drummer  CC: Seizure  HPI: This is a 80 y.o. woman w/ h/o leukemia, CAD/CHF, CKD, prior TIA, A-flutter, who presented with episodes of slurred speech that lasted for a few minutes and were self-resolved, also note of some right sided facial movements. Workup showed an intracranial mass c/w meningioma, hyperglycemia of 642, hypocalcemia, and hyponatremia of 131. She reports maintaining consciousness throughout these events, intact speech reception, with little to no speech output due to [facility].   ROS: A 14 point ROS was performed and is negative except as noted in the HPI.   PMHx:  Past Medical History:  Diagnosis Date   acute myeloblastic leukemia (AML) dx'd 06/2014   Acute myelogenous leukemia ([place]) 02/2014   Acute pancreatitis    Arthritis    "back" (09/17/2017)   Atrial flutter (HCC)    Cardiomyopathy (HCC)    Chronic kidney disease, stage II (mild)    Chronic lower back pain    Colon polyps 04/29/2010   TUBULAR ADENOMA AND A SERRATED ADENOMA   COPD (chronic obstructive pulmonary disease) ([place])    CORONARY ARTERY DISEASE 12/24/2007   DIABETES MELLITUS, TYPE II 07/13/2007   Family history of cervical cancer    Family history of esophageal cancer    Family history of leukemia    Family history of lung cancer    Family history of skin cancer    History of blood transfusion 2015   "related to leukemia"   History of kidney stones 12/24/2007   History of uterine cancer    HYPERLIPIDEMIA 12/24/2007   HYPERTENSION 07/13/2007   NSTEMI (non-ST elevated myocardial infarction) ([place]) 09/17/2017   Unspecified disease of pancreas    Uterine cancer ([place])    FamHx:  Family History  Problem Relation Age of Onset   COPD Father    Esophageal cancer Father    Breast cancer Sister 70   Lung cancer Sister    Esophageal cancer Brother    Suicidality Brother    Lung cancer Sister    Lung cancer Sister     Cervical cancer Sister    Congestive Heart Failure Mother    Heart attack Maternal Grandmother    Skin cancer Maternal Grandfather    Heart attack Maternal Grandfather    Stroke Paternal Grandmother 40   Heart attack Paternal Grandfather    Cancer Maternal Uncle        unknown type, dx. >47   Cancer Paternal Aunt        unknown type, dx. >50   Cancer Paternal Uncle        unknown type, dx. >50   Cirrhosis Maternal Uncle    Skin cancer Daughter    Leukemia Cousin        maternal first cousin; dx. >50, in remission   Cancer Niece        unknown type behind her ear, dx. 30s/40s   Colon cancer Neg Hx    SocHx:  reports that she quit smoking about 21 years ago. Her smoking use included cigarettes. She has a 20.00 pack-year smoking history. She has never used smokeless tobacco. She reports that she does not drink alcohol and does not use drugs.  Exam: Vital signs in last 24 hours: Temp:  [98.1 F (36.7 C)-99.3 F (37.4 C)] 98.1 F (36.7 C) (12/30 0400) Pulse Rate:  [64-77] 73 (12/30 1059) Resp:  [12-27] 23 (12/30 1059) BP: (118-163)/(41-75) 163/54 (12/30 1059) SpO2:  [98 %-  100 %] 100 % (12/30 1059) Weight:  [74.8 kg] 74.8 kg (12/29 2332) General: Awake, alert, cooperative, lying in bed in NAD Head: Normocephalic and atruamatic w/ EEG leads in place HEENT: Neck supple Pulmonary: breathing room air comfortably, no evidence of increased work of breathing Cardiac: RRR Abdomen: S NT ND Extremities: Warm and well perfused x4 Neuro: AOx3, PERRL, EOMI, FS, speech fluent with normal content Strength 5/5 x4, SILTx4, no drift   Assessment and Plan: 80 y.o. R handed woman w/ semiology c/w left sided focal partial seizures. MRI brain w/o (CKD) personally reviewed, which shows a midline parafalcine dural-based mass without associated edema or significant brain compression. Seen on prior MRIs, currently measures 72mm on MRI, was previously 68mm in 2018 on MRI. Using today's CT (it's very  calcified), it looks like it has been 27mm on CT in 2022, 2016, and 2015.   -no acute neurosurgical intervention indicated at this time, certainly could be possible cause of the seizure but has multiple metabolic factors that could be the cause. Given her multiple significant medical problems and advanced age, would only recommend surgery if her seizures were medically refractory. Discussed this w/ the patient and she agreed. I don't see anything on her MRI concerning for a chloroma, no lab evidence c/w leukostasis. -symptomatic zone is left operculum, if it's from the tumor then she could have an [place] that's mesial frontal, doubt it would be cingulate, but I'd be surprised if she had such widespread electrophysiologic dysfunction in the L F lobe with such focal symptoms -please call with any concerns or questions  Judith Part, MD 12/06/21 1:01 PM [facility]

## 2021-12-06 NOTE — ED Notes (Signed)
Daughter wants to be contacted with any updates and when her mother gets to her room.

## 2021-12-06 NOTE — ED Notes (Signed)
PTAR called 7:23a - be ready for eta of "next"

## 2021-12-06 NOTE — Procedures (Signed)
Patient Name: [name]  MRN: [id number]  Epilepsy Attending: Lora Havens  Referring Physician/Provider: Dr Kerney Elbe Date: 12/06/2021 Duration: 20.59 mins  Patient history: 80 year old female with seizure-like episodes, not on antiseizure medications.  MRI brain showed anterior falcine meningioma with mass-effect but no edema as well as blood glucose of 541. EEG to evaluate for seizure  Level of alertness: Awake, asleep  AEDs during EEG study: LEV  Technical aspects: This EEG study was done with scalp electrodes positioned according to the 10-20 International system of electrode placement. Electrical activity was acquired at a sampling rate of 500Hz  and reviewed with a high frequency filter of 70Hz  and a low frequency filter of 1Hz . EEG data were recorded continuously and digitally stored.   Description: The posterior dominant rhythm consists of 9Hz  activity of moderate voltage (25-35 uV) seen predominantly in posterior head regions, symmetric and reactive to eye opening and eye closing. Sleep was characterized by vertex waves, sleep spindles (12 to 14 Hz), maximal frontocentral region. Hyperventilation and photic stimulation were not performed.     IMPRESSION: This study is within normal limits. No seizures or epileptiform discharges were seen throughout the recording.  [name]

## 2021-12-06 NOTE — Progress Notes (Signed)
LTM hooked up at bedside. No skin breakdown noted. Results pending. °

## 2021-12-06 NOTE — Progress Notes (Signed)
Subjective: Patient states she had another brief episode of speech disturbance this morning.  Denies any other concerns  ROS: negative except above  Examination  Vital signs in last 24 hours: Temp:  [98.1 F (36.7 C)-99.3 F (37.4 C)] 98.1 F (36.7 C) (12/30 0400) Pulse Rate:  [64-77] 77 (12/30 0700) Resp:  [12-27] 20 (12/30 0700) BP: (118-153)/(41-75) 147/75 (12/30 0700) SpO2:  [98 %-100 %] 98 % (12/30 0700) Weight:  [74.8 kg] 74.8 kg (12/29 2332)  General: lying in bed, NAD CVS: pulse-normal rate and rhythm RS: breathing comfortably, CTAB Extremities:warm, no edema  Neuro: MS: Alert, oriented, follows commands CN: pupils equal and reactive,  EOMI, face symmetric, tongue midline, normal sensation over face, Motor: 5/5 strength in all 4 extremities  Basic Metabolic Panel: Recent Labs  Lab 12/05/21 2133-03-16 12/05/21 2148 12/06/21 0405  NA 131* 134* 136  K 3.8 3.9 3.2*  CL 101 99 106  CO2 21*  --  24  GLUCOSE 622* 642* 157*  BUN 29* 30* 27*  CREATININE 1.95* 1.70* 1.63*  CALCIUM 7.7*  --  7.7*  MG  --   --  1.9    CBC: Recent Labs  Lab 12/05/21 03/16/33 12/05/21 03/17/47 12/06/21 0405  WBC 6.0  --  5.2  NEUTROABS 3.9  --   --   HGB 11.7* 11.9* 10.4*  HCT 34.4* 35.0* 30.3*  MCV 90.5  --  91.0  PLT 131*  --  111*     Coagulation Studies: Recent Labs    12/05/21 03-16-2133  LABPROT 14.6  INR 1.1    Imaging   MRI brain without contrast 12/06/2021:  1. Aging brain without acute finding or explanation for symptoms. 2. 17 mm anterior falcine meningioma.  No adjacent brain edema.   ASSESSMENT AND PLAN: 80 year old female with seizure-like episodes, not on antiseizure medications.  MRI brain showed anterior falcine meningioma with mass-effect but no edema as well as blood glucose of 541.  Suspected new onset seizures Meningioma with mass-effect Uncontrolled diabetes -Had 1 more episode this morning.  Recommendations -EEG ordered and pending -Continue Keppra 500 mg  twice daily for now.  If EEG shows any further seizures, can increase Keppra to 750 mg twice daily -If no further seizures overnight, will likely DC LTM tomorrow and consider discharging patient -Recommend neurosurgery consult as the size of meningioma has gradually increased over the last few years and also has some mass-effect -Seizure precautions -as needed IV Ativan 2 mg for clinical seizure-like activity.  Please notify neurology if administered -Management of rest of the comorbidities per primary team  I have spent a total of 36  minutes with the patient reviewing hospital notes,  test results, labs and examining the patient as well as establishing an assessment and plan that was discussed personally with the patient.  > 50% of time was spent in direct patient care.     Zeb Comfort Epilepsy [facility] For questions after 5pm please refer to AMION to reach the Neurologist on call

## 2021-12-07 ENCOUNTER — Inpatient Hospital Stay (HOSPITAL_COMMUNITY): Payer: Medicare HMO

## 2021-12-07 LAB — BASIC METABOLIC PANEL
Anion gap: 9 (ref 5–15)
BUN: 28 mg/dL — ABNORMAL HIGH (ref 8–23)
CO2: 21 mmol/L — ABNORMAL LOW (ref 22–32)
Calcium: 7.6 mg/dL — ABNORMAL LOW (ref 8.9–10.3)
Chloride: 108 mmol/L (ref 98–111)
Creatinine, Ser: 2.23 mg/dL — ABNORMAL HIGH (ref 0.44–1.00)
GFR, Estimated: 22 mL/min — ABNORMAL LOW (ref >60)
Glucose, Bld: 245 mg/dL — ABNORMAL HIGH (ref 70–99)
Potassium: 3.8 mmol/L (ref 3.5–5.1)
Sodium: 138 mmol/L (ref 135–145)

## 2021-12-07 LAB — GLUCOSE, CAPILLARY
Glucose-Capillary: 193 mg/dL — ABNORMAL HIGH (ref 70–99)
Glucose-Capillary: 186 mg/dL — ABNORMAL HIGH (ref 70–99)
Glucose-Capillary: 210 mg/dL — ABNORMAL HIGH (ref 70–99)
Glucose-Capillary: 156 mg/dL — ABNORMAL HIGH (ref 70–99)

## 2021-12-07 LAB — CBC
HCT: 30.9 % — ABNORMAL LOW (ref 36.0–46.0)
Hemoglobin: 10.3 g/dL — ABNORMAL LOW (ref 12.0–15.0)
MCH: 30.6 pg (ref 26.0–34.0)
MCHC: 33.3 g/dL (ref 30.0–36.0)
MCV: 91.7 fL (ref 80.0–100.0)
Platelets: 114 10*3/uL — ABNORMAL LOW (ref 150–400)
RBC: 3.37 MIL/uL — ABNORMAL LOW (ref 3.87–5.11)
RDW: 12.2 % (ref 11.5–15.5)
WBC: 4.9 10*3/uL (ref 4.0–10.5)
nRBC: 0 % (ref 0.0–0.2)

## 2021-12-07 MED ORDER — IOHEXOL 9 MG/ML PO SOLN
ORAL | Status: AC
  Administered 2021-12-07: 500 mL
  Filled 2021-12-07: qty 1000

## 2021-12-07 MED ORDER — LEVETIRACETAM 750 MG PO TABS
750.0000 mg | ORAL_TABLET | Freq: Two times a day (BID) | ORAL | Status: DC
  Administered 2021-12-07 – 2021-12-08 (×2): 750 mg via ORAL
  Filled 2021-12-07 (×2): qty 1

## 2021-12-07 MED ORDER — LEVETIRACETAM 250 MG PO TABS
250.0000 mg | ORAL_TABLET | ORAL | Status: AC
  Administered 2021-12-07: 250 mg via ORAL
  Filled 2021-12-07: qty 1

## 2021-12-07 MED ORDER — LORAZEPAM 0.5 MG PO TABS
0.5000 mg | ORAL_TABLET | Freq: Four times a day (QID) | ORAL | Status: DC | PRN
  Administered 2021-12-08 – 2021-12-09 (×2): 0.5 mg via ORAL
  Filled 2021-12-07: qty 1

## 2021-12-07 MED ORDER — SODIUM CHLORIDE 0.9 % IV SOLN: INTRAVENOUS | Status: AC

## 2021-12-07 NOTE — Progress Notes (Addendum)
Stool sample sent for GI Panel and C Diff Panel. However, stool sample is type 5, therefore, they are unable to complete testing. MD made aware. Verbal from provider to discontinue precautions.

## 2021-12-07 NOTE — Progress Notes (Signed)
PROGRESS NOTE  [name] ERD:[id number] DOB: [birth year] DOA: 12/05/2021 PCP: Isaac Bliss, Rayford Halsted, MD   LOS: 1 day   Brief Narrative / Interim history: 80 year old female with history of leukemia in remission, diastolic CHF, breast cancer, hypertension, hyperlipidemia, chronic kidney disease stage IV, DM 2, comes into the hospital with episodes of seizure-like activity.  Patient presented with several episodes of slurred speech that lasted several minutes, unable to speak clearly.  She was also noted to have right thigh and right lip twitching and some drooling from the right side of the mouth.  Neurology consulted after admission.  She was also seen to have significant hyperglycemia and was started on insulin drip  Subjective / 24h Interval events: Complains of intermittent abdominal pain and persistent diarrhea this morning.  C. difficile and GI pathogen panel were sent however sample was too formed and could not be ran  Assessment & Plan: Principal Problem Concern for seizure activity-neurology consulted and following.  Episodes at home-in the setting of profound hyperglycemia but she had recurrent episode on the morning of admission when her CBG was in the mid 100s.  She has been placed on Keppra, continuous EEG monitoring currently showing that she has had 1 seizure arising in the left temporal area.  Further management per neurology.  Active Problems DM2, with hyperglycemia, HHS-CBGs improved and have remained stable.  She was initially on insulin drip and now is on subcutaneous, continue  CBG (last 3)  Recent Labs    12/06/21 1554 12/06/21 2247 12/07/21 0659  GLUCAP 258* 360* 193*   Meningioma -MRI of the brain showed a 17 mm anterior falcine meningioma with local cortical mass-effect but no brain edema.  This is increased since prior MRI few years ago.  Neurosurgery consulted, no surgical indications currently  Acute kidney injury on chronic kidney disease stage  IV-Baseline creatinine around 1.5.  Creatinine was 1.95 on admission, likely in the setting of dehydration due to HHS.  Creatinine improved yesterday morning but back up today to 2.2, possibly related to ongoing diarrhea. Start limited IVF for 24h  Intermittent abdominal pain, diarrhea-she reports significant diarrhea over the past couple years.  Never saw GI.  Patient has a history of CAD.  Repeat C. difficile and GI pathogen panel could not be run because the stool was too formed.  Reports around 7 stools per day, as well as incontinence.  She reports intermittent abdominal pain.  We will obtain a CT scan of the abdomen and pelvis to further evaluate with oral contrast alone.  Can probably benefit from outpatient GI referral  Thrombocytopenia-acute on chronic, stable  Hypokalemia-replete  Chronic diastolic CHF-appears euvolemic, limited fluids as above  Coronary artery disease-continue home regimen, no pain  Hypertension-continue home regimen  Hyperlipidemia-continue home regimen  History of TIA-continue aspirin, Plavix, statin  History of AML and breast cancer- Noted  Scheduled Meds:  amLODipine  10 mg Oral Daily   aspirin EC  81 mg Oral Daily   atorvastatin  40 mg Oral Daily   Chlorhexidine Gluconate Cloth  6 each Topical Daily   enoxaparin (LOVENOX) injection  30 mg Subcutaneous Q24H   fluticasone furoate-vilanterol  1 puff Inhalation Daily   insulin aspart  0-15 Units Subcutaneous TID WC   insulin aspart  0-5 Units Subcutaneous QHS   insulin glargine-yfgn  20 Units Subcutaneous Daily   isosorbide mononitrate  45 mg Oral Daily   letrozole  2.5 mg Oral Daily   levETIRAcetam  500 mg Oral BID  metoprolol succinate  100 mg Oral Daily   sodium chloride flush  10-40 mL Intracatheter Q12H   sodium chloride flush  3 mL Intravenous Once   sodium chloride flush  3 mL Intravenous Q12H   torsemide  20 mg Oral Q supper   torsemide  40 mg Oral Q breakfast   Continuous Infusions: PRN  Meds:.acetaminophen **OR** acetaminophen, dextrose, HYDROcodone-acetaminophen, LORazepam, polyethylene glycol, sodium chloride flush  Diet Orders (From admission, onward)     Start     Ordered   12/06/21 0650  Diet heart healthy/carb modified Room service appropriate? Yes; Fluid consistency: Thin  Diet effective now       Question Answer Comment  Diet-HS Snack? Nothing   Room service appropriate? Yes   Fluid consistency: Thin      12/06/21 0650            DVT prophylaxis: enoxaparin (LOVENOX) injection 30 mg Start: 12/06/21 1000     Code Status: Full Code  Family Communication: no family at bedside   Status is: Inpatient  Remains inpatient due to AKI  Level of care: Telemetry Medical  Consultants:  Neurology   Procedures:  none  Microbiology  none  Antimicrobials: none    Objective: Vitals:   12/07/21 0013 12/07/21 0411 12/07/21 0500 12/07/21 0859  BP: (!) 136/55 (!) 137/51  (!) 139/54  Pulse: 66 61  64  Resp: 18 17  19   Temp: (!) 97.4 F (36.3 C) 97.7 F (36.5 C)  97.7 F (36.5 C)  TempSrc: Oral Oral  Oral  SpO2: 99% 100%  97%  Weight:   79 kg   Height:        Intake/Output Summary (Last 24 hours) at 12/07/2021 1003 Last data filed at 12/06/2021 1400 Gross per 24 hour  Intake 10.15 ml  Output --  Net 10.15 ml   Filed Weights   12/05/21 2332 12/07/21 0500  Weight: 74.8 kg 79 kg    Examination:  Constitutional: She is in no distress Eyes: No scleral icterus ENMT: mmm Neck: normal, supple Respiratory: Clear bilaterally, no wheezing or crackles. Cardiovascular: Regular rate and rhythm, no murmurs, no peripheral edema Abdomen: Soft, NT, ND, bowel sounds positive Musculoskeletal: no clubbing / cyanosis.  Skin: No rashes appreciated Neurologic: Nonfocal, equal strength  Data Reviewed: I have independently reviewed following labs and imaging studies   CBC: Recent Labs  Lab 12/05/21 2134 12/05/21 2148 12/06/21 0405 12/07/21 0147   WBC 6.0  --  5.2 4.9  NEUTROABS 3.9  --   --   --   HGB 11.7* 11.9* 10.4* 10.3*  HCT 34.4* 35.0* 30.3* 30.9*  MCV 90.5  --  91.0 91.7  PLT 131*  --  111* 114*    Basic Metabolic Panel: Recent Labs  Lab 12/05/21 2134 12/05/21 2148 12/06/21 0405 12/07/21 0147  NA 131* 134* 136 138  K 3.8 3.9 3.2* 3.8  CL 101 99 106 108  CO2 21*  --  24 21*  GLUCOSE 622* 642* 157* 245*  BUN 29* 30* 27* 28*  CREATININE 1.95* 1.70* 1.63* 2.23*  CALCIUM 7.7*  --  7.7* 7.6*  MG  --   --  1.9  --     Liver Function Tests: Recent Labs  Lab 12/05/21 2134 12/06/21 0405  AST 47* 29  ALT 31 26  ALKPHOS 149* 122  BILITOT 0.7 0.7  PROT 5.9* 5.0*  ALBUMIN 3.1* 2.7*    Coagulation Profile: Recent Labs  Lab 12/05/21 2134  INR  1.1    HbA1C: Recent Labs    Dec 23, 2021 1440  HGBA1C 11.3*   CBG: Recent Labs  Lab 12/23/2021 0848 12/23/2021 1142 12/23/21 1554 12-23-2021 2247 12/07/21 0659  GLUCAP 172* 211* 258* 360* 193*     Recent Results (from the past 240 hour(s))  Resp Panel by RT-PCR (Flu A&B, Covid) Nasopharyngeal Swab     Status: None   Collection Time: 12/05/21  9:48 PM   Specimen: Nasopharyngeal Swab; Nasopharyngeal(NP) swabs in vial transport medium  Result Value Ref Range Status   SARS Coronavirus 2 by RT PCR NEGATIVE NEGATIVE Final    Comment: (NOTE) SARS-CoV-2 target nucleic acids are NOT DETECTED.  The SARS-CoV-2 RNA is generally detectable in upper respiratory specimens during the acute phase of infection. The lowest concentration of SARS-CoV-2 viral copies this assay can detect is 138 copies/mL. A negative result does not preclude SARS-Cov-2 infection and should not be used as the sole basis for treatment or other patient management decisions. A negative result may occur with  improper specimen collection/handling, submission of specimen other than nasopharyngeal swab, presence of viral mutation(s) within the areas targeted by this assay, and inadequate number of  viral copies(<138 copies/mL). A negative result must be combined with clinical observations, patient history, and epidemiological information. The expected result is Negative.  Fact Sheet for Patients:  EntrepreneurPulse.com.au  Fact Sheet for Healthcare Providers:  IncredibleEmployment.be  This test is no t yet approved or cleared by the [place] FDA and  has been authorized for detection and/or diagnosis of SARS-CoV-2 by FDA under an Emergency Use Authorization (EUA). This EUA will remain  in effect (meaning this test can be used) for the duration of the COVID-19 declaration under Section 564(b)(1) of the Act, 21 U.S.C.section 360bbb-3(b)(1), unless the authorization is terminated  or revoked sooner.       Influenza A by PCR NEGATIVE NEGATIVE Final   Influenza B by PCR NEGATIVE NEGATIVE Final    Comment: (NOTE) The Xpert Xpress SARS-CoV-2/FLU/RSV plus assay is intended as an aid in the diagnosis of influenza from Nasopharyngeal swab specimens and should not be used as a sole basis for treatment. Nasal washings and aspirates are unacceptable for Xpert Xpress SARS-CoV-2/FLU/RSV testing.  Fact Sheet for Patients: EntrepreneurPulse.com.au  Fact Sheet for Healthcare Providers: IncredibleEmployment.be  This test is not yet approved or cleared by the [place] FDA and has been authorized for detection and/or diagnosis of SARS-CoV-2 by FDA under an Emergency Use Authorization (EUA). This EUA will remain in effect (meaning this test can be used) for the duration of the COVID-19 declaration under Section 564(b)(1) of the Act, 21 U.S.C. section 360bbb-3(b)(1), unless the authorization is terminated or revoked.  Performed at [facility], [place] [address], [place], [place] [address]       Radiology Studies: EEG adult  Result Date: 12/23/21 Lora Havens, MD     12/23/21 11:26 AM  Patient Name: MORAYO LEVEN MRN: [id number] Epilepsy Attending: Lora Havens Referring Physician/Provider: Dr Kerney Elbe Date: 12/23/2021 Duration: 20.59 mins Patient history: 80 year old female with seizure-like episodes, not on antiseizure medications.  MRI brain showed anterior falcine meningioma with mass-effect but no edema as well as blood glucose of 541. EEG to evaluate for seizure Level of alertness: Awake, asleep AEDs during EEG study: LEV Technical aspects: This EEG study was done with scalp electrodes positioned according to the 10-20 International system of electrode placement. Electrical activity was acquired at a sampling rate of 500Hz  and reviewed  with a high frequency filter of 70Hz  and a low frequency filter of 1Hz . EEG data were recorded continuously and digitally stored. Description: The posterior dominant rhythm consists of 9Hz  activity of moderate voltage (25-35 uV) seen predominantly in posterior head regions, symmetric and reactive to eye opening and eye closing. Sleep was characterized by vertex waves, sleep spindles (12 to 14 Hz), maximal frontocentral region. Hyperventilation and photic stimulation were not performed.   IMPRESSION: This study is within normal limits. No seizures or epileptiform discharges were seen throughout the recording. Priyanka Barbra Sarks   Overnight EEG with video  Result Date: 12/07/2021 Lora Havens, MD     12/07/2021  9:36 AM Patient Name: NANETTE WIRSING MRN: [id number] Epilepsy Attending: Lora Havens Referring Physician/Provider: Dr Kerney Elbe Duration: 12/06/2021 1128 to 12/07/2021 0935  Patient history: 80 year old female with seizure-like episodes, not on antiseizure medications.  MRI brain showed anterior falcine meningioma with mass-effect but no edema as well as blood glucose of 541. EEG to evaluate for seizure  Level of alertness: Awake, asleep  AEDs during EEG study: LEV  Technical aspects: This EEG study was done with scalp electrodes  positioned according to the 10-20 International system of electrode placement. Electrical activity was acquired at a sampling rate of 500Hz  and reviewed with a high frequency filter of 70Hz  and a low frequency filter of 1Hz . EEG data were recorded continuously and digitally stored.  Description: The posterior dominant rhythm consists of 9Hz  activity of moderate voltage (25-35 uV) seen predominantly in posterior head regions, symmetric and reactive to eye opening and eye closing. Sleep was characterized by vertex waves, sleep spindles (12 to 14 Hz), maximal frontocentral region. Intermittent left temporal 6-7hz  theta slowing was noted One seizure without clinical signs was noted on 12/06/2021 at 2237. EEG showed 6-7hz  theta slowing in left temporal region which then involved all of left hemisphere and right temporal region. EEG then showed 3-4hz  delta slowing. Duration of seizure was about 2 minutes. Hyperventilation and photic stimulation were not performed.   ABNORMALITY - Seizure without clinical signs, left temporal region - Intermittent slow, left temporal region  IMPRESSION: This study showed one seizure without clinical signs on 12/06/2021 at 2237 arising from left temporal region, lasting about 3 minutes. There is also cortical dysfunction in left temporal region likely secondary to underlying structural abnormality.  Priyanka Mont Dutton, MD, PhD [facility]  Between 7 am - 7 pm I am available, please contact me via Amion (for emergencies) or Securechat (non urgent messages)  Between 7 pm - 7 am I am not available, please contact night coverage MD/APP via Amion

## 2021-12-07 NOTE — Progress Notes (Signed)
Subjective: Had seizure overnight.  Denies any other concerns.  ROS: negative except above  Examination  Vital signs in last 24 hours: Temp:  [97.4 F (36.3 C)-98.3 F (36.8 C)] 98.2 F (36.8 C) (12/31 1229) Pulse Rate:  [60-72] 60 (12/31 1229) Resp:  [17-20] 20 (12/31 1229) BP: (136-140)/(51-59) 140/57 (12/31 1229) SpO2:  [97 %-100 %] 100 % (12/31 1229) Weight:  [79 kg] 79 kg (12/31 0500)  General: lying in bed, NAD CVS: pulse-normal rate and rhythm RS: breathing comfortably, CTAB Extremities:warm, no edema  Neuro: MS: Alert, oriented, follows commands CN: pupils equal and reactive,  EOMI, face symmetric, tongue midline, normal sensation over face, Motor: 5/5 strength in all 4 extremities  Basic Metabolic Panel: Recent Labs  Lab 12/05/21 2134 12/05/21 2148 12/06/21 0405 12/07/21 0147  NA 131* 134* 136 138  K 3.8 3.9 3.2* 3.8  CL 101 99 106 108  CO2 21*  --  24 21*  GLUCOSE 622* 642* 157* 245*  BUN 29* 30* 27* 28*  CREATININE 1.95* 1.70* 1.63* 2.23*  CALCIUM 7.7*  --  7.7* 7.6*  MG  --   --  1.9  --      CBC: Recent Labs  Lab 12/05/21 2134 12/05/21 2148 12/06/21 0405 12/07/21 0147  WBC 6.0  --  5.2 4.9  NEUTROABS 3.9  --   --   --   HGB 11.7* 11.9* 10.4* 10.3*  HCT 34.4* 35.0* 30.3* 30.9*  MCV 90.5  --  91.0 91.7  PLT 131*  --  111* 114*    Coagulation Studies: Recent Labs    12/05/21 2134  LABPROT 14.6  INR 1.1    Imaging   MRI brain without contrast 12/06/2021:  1. Aging brain without acute finding or explanation for symptoms. 2. 17 mm anterior falcine meningioma.  No adjacent brain edema.   LTM 12/06/2021 - 12/07/2021: One non-clinical seizure on 12/06/2021 at 2237 lasting ~2.5 minutes noted to evolve in the left temporal region with spread into entire left hemisphere and into right temporal region.    ASSESSMENT AND PLAN: 80 year old female calcified anterior falcine meningioma with mass-effect but no edema found to have left temporal  focal to bilateral seizure without clinical correlate not previously on antiseizure medications. She was started on LEV and had breakthrough seizure and will need to increase dose.    New onset left focal to bilateral non-clinical seizures Meningioma with mass-effect Uncontrolled diabetes  Recommendations  -Increase Keppra to 750mg  twice daily - Ordered. -Continue LTM for now and if no further seizures overnight, will likely DC LTM tomorrow and consider discharging patient. -Recommend neurosurgery consult as the size of meningioma has gradually increased over the last few years and also has some mass-effect. -Seizure precautions. -as needed IV Ativan 2 mg for clinical seizure-like activity.  Please notify neurology if administered. -Management of rest of the comorbidities per primary team.   Electronically signed by:  Lynnae Sandhoff, MD Page: [phone] 12/07/2021, 1:12 PM

## 2021-12-07 NOTE — Progress Notes (Signed)
LTM maint complete - no skin breakdown  Serviced O2, and lead bundle.  [facility] monitored.

## 2021-12-07 NOTE — Progress Notes (Signed)
LTM maintain done at bedside. No skin breakdown noted. Results pending ° °

## 2021-12-07 NOTE — Procedures (Addendum)
Patient Name: [name]  MRN: [id number]  Epilepsy Attending: Lora Havens  Referring Physician/Provider: Dr Kerney Elbe Duration: 12/06/2021 1128 to 12/07/2021 1128   Patient history: 80 year old female with seizure-like episodes, not on antiseizure medications.  MRI brain showed anterior falcine meningioma with mass-effect but no edema as well as blood glucose of 541. EEG to evaluate for seizure   Level of alertness: Awake, asleep   AEDs during EEG study: LEV   Technical aspects: This EEG study was done with scalp electrodes positioned according to the 10-20 International system of electrode placement. Electrical activity was acquired at a sampling rate of 500Hz  and reviewed with a high frequency filter of 70Hz  and a low frequency filter of 1Hz . EEG data were recorded continuously and digitally stored.    Description: The posterior dominant rhythm consists of 9Hz  activity of moderate voltage (25-35 uV) seen predominantly in posterior head regions, symmetric and reactive to eye opening and eye closing. Sleep was characterized by vertex waves, sleep spindles (12 to 14 Hz), maximal frontocentral region. Intermittent left temporal 6-7hz  theta slowing was noted   One seizure without clinical signs was noted on 12/06/2021 at 2237. EEG showed 6-7hz  theta slowing in left temporal region which then involved all of left hemisphere and right temporal region. EEG then showed 3-4hz  delta slowing. Duration of seizure was about 2 minutes.   Hyperventilation and photic stimulation were not performed.     ABNORMALITY - Seizure without clinical signs, left temporal region - Intermittent slow, left temporal region   IMPRESSION: This study showed one seizure without clinical signs on 12/06/2021 at 2237 arising from left temporal region, lasting about 3 minutes. There is also cortical dysfunction in left temporal region likely secondary to underlying structural abnormality.    Lilyian Quayle Barbra Sarks

## 2021-12-08 DIAGNOSIS — R932 Abnormal findings on diagnostic imaging of liver and biliary tract: Secondary | ICD-10-CM

## 2021-12-08 DIAGNOSIS — K7460 Unspecified cirrhosis of liver: Secondary | ICD-10-CM

## 2021-12-08 LAB — GLUCOSE, CAPILLARY
Glucose-Capillary: 105 mg/dL — ABNORMAL HIGH (ref 70–99)
Glucose-Capillary: 220 mg/dL — ABNORMAL HIGH (ref 70–99)
Glucose-Capillary: 89 mg/dL (ref 70–99)
Glucose-Capillary: 85 mg/dL (ref 70–99)
Glucose-Capillary: 156 mg/dL — ABNORMAL HIGH (ref 70–99)

## 2021-12-08 LAB — COMPREHENSIVE METABOLIC PANEL
ALT: 24 U/L (ref 0–44)
AST: 23 U/L (ref 15–41)
Albumin: 2.8 g/dL — ABNORMAL LOW (ref 3.5–5.0)
Alkaline Phosphatase: 117 U/L (ref 38–126)
Anion gap: 8 (ref 5–15)
BUN: 29 mg/dL — ABNORMAL HIGH (ref 8–23)
CO2: 22 mmol/L (ref 22–32)
Calcium: 7.6 mg/dL — ABNORMAL LOW (ref 8.9–10.3)
Chloride: 107 mmol/L (ref 98–111)
Creatinine, Ser: 1.91 mg/dL — ABNORMAL HIGH (ref 0.44–1.00)
GFR, Estimated: 26 mL/min — ABNORMAL LOW (ref >60)
Glucose, Bld: 152 mg/dL — ABNORMAL HIGH (ref 70–99)
Potassium: 3.6 mmol/L (ref 3.5–5.1)
Sodium: 137 mmol/L (ref 135–145)
Total Bilirubin: 0.7 mg/dL (ref 0.3–1.2)
Total Protein: 5.3 g/dL — ABNORMAL LOW (ref 6.5–8.1)

## 2021-12-08 LAB — CBC
HCT: 30.7 % — ABNORMAL LOW (ref 36.0–46.0)
Hemoglobin: 10.6 g/dL — ABNORMAL LOW (ref 12.0–15.0)
MCH: 31.4 pg (ref 26.0–34.0)
MCHC: 34.5 g/dL (ref 30.0–36.0)
MCV: 90.8 fL (ref 80.0–100.0)
Platelets: 113 10*3/uL — ABNORMAL LOW (ref 150–400)
RBC: 3.38 MIL/uL — ABNORMAL LOW (ref 3.87–5.11)
RDW: 12.2 % (ref 11.5–15.5)
WBC: 5.8 10*3/uL (ref 4.0–10.5)
nRBC: 0 % (ref 0.0–0.2)

## 2021-12-08 LAB — PROTIME-INR
INR: 1.1 (ref 0.8–1.2)
Prothrombin Time: 13.9 seconds (ref 11.4–15.2)

## 2021-12-08 LAB — HEPATITIS PANEL, ACUTE
HCV Ab: NONREACTIVE
Hep A IgM: NONREACTIVE
Hep B C IgM: NONREACTIVE
Hepatitis B Surface Ag: NONREACTIVE

## 2021-12-08 MED ORDER — ONDANSETRON HCL 4 MG/2ML IJ SOLN
4.0000 mg | Freq: Four times a day (QID) | INTRAMUSCULAR | Status: DC | PRN
  Administered 2021-12-08: 4 mg via INTRAVENOUS
  Filled 2021-12-08: qty 2

## 2021-12-08 MED ORDER — LORAZEPAM 2 MG/ML IJ SOLN
0.5000 mg | Freq: Once | INTRAMUSCULAR | Status: AC
  Administered 2021-12-09: 0.5 mg via INTRAVENOUS
  Filled 2021-12-08: qty 1

## 2021-12-08 MED ORDER — SODIUM CHLORIDE 0.9 % IV SOLN: INTRAVENOUS | Status: DC

## 2021-12-08 MED ORDER — SODIUM CHLORIDE 0.9 % IV SOLN
750.0000 mg | Freq: Two times a day (BID) | INTRAVENOUS | Status: DC
  Administered 2021-12-08 – 2021-12-09 (×2): 750 mg via INTRAVENOUS
  Filled 2021-12-08 (×3): qty 7.5

## 2021-12-08 NOTE — Plan of Care (Signed)
Neurology plan of care   Patient had not seizures overnight and tolerated levetiracetam at higher dose without reported side effects.   Discontinue LTM Continue levetiracetam 750mg  two times daily unchanged.  Follow up outpatient neurology. Please call for questions.   Electronically signed by:  Lynnae Sandhoff, MD Page: [phone] 12/08/2021, 7:46 AM

## 2021-12-08 NOTE — Procedures (Addendum)
Patient Name: ZAHRIAH ROES  MRN: [id number]  Epilepsy Attending: Lora Havens  Referring Physician/Provider: Dr Kerney Elbe Duration: 12/07/2021 1128 to 12/08/2021 0954   Patient history: 81 year old female with seizure-like episodes, not on antiseizure medications.  MRI brain showed anterior falcine meningioma with mass-effect but no edema as well as blood glucose of 541. EEG to evaluate for seizure   Level of alertness: Awake, asleep   AEDs during EEG study: LEV   Technical aspects: This EEG study was done with scalp electrodes positioned according to the 10-20 International system of electrode placement. Electrical activity was acquired at a sampling rate of 500Hz  and reviewed with a high frequency filter of 70Hz  and a low frequency filter of 1Hz . EEG data were recorded continuously and digitally stored.    Description: The posterior dominant rhythm consists of 9Hz  activity of moderate voltage (25-35 uV) seen predominantly in posterior head regions, symmetric and reactive to eye opening and eye closing. Sleep was characterized by vertex waves, sleep spindles (12 to 14 Hz), maximal frontocentral region. Intermittent left temporal 6-7hz  theta slowing was noted. Hyperventilation and photic stimulation were not performed.      ABNORMALITY - Intermittent slow, left temporal region   IMPRESSION: This study is suggestive of cortical dysfunction in left temporal region likely secondary to underlying structural abnormality. No seizure or epileptiform discharges were seen during this study.    Keelee Yankey Barbra Sarks

## 2021-12-08 NOTE — Consult Note (Addendum)
Consultation  Referring Provider: [facility]/ Starla Link MD Primary Care Physician:  Isaac Bliss, Rayford Halsted, MD Primary Gastroenterologist:  Dr. Henrene Pastor Reason for Consultation: Abnormal CT imaging  HPI: [name] is a 81 y.o. female, known to Dr. Henrene Pastor, last seen in 2016 and had colonoscopy in 2016 with finding of a 10 mm cecal AVM and a 4 mm polyp which was removed. Patient has history of coronary artery disease status post stent 2018 and maintained on aspirin and Plavix, congestive heart failure, history of breast cancer, history of acute myeloid leukemia diagnosed 2014 and in remission, history of hypertension chronic kidney disease stage IV and adult onset diabetes mellitus-insulin-dependent. Patient was admitted on 12/06/2021 after seizure-like activity at home. Work-up in the emergency room with significant hyperglycemia glucose 642, EEG negative for seizures. MRI shows a 17 mm meningioma, to be followed up as outpatient. Further imaging of the abdomen with CT shows a nodular liver contour, determinate hypodense lesion in the right hepatic lobe measuring 1.6 cm, small gallstones, 4.0 x 3.8 x 4.4 cm complex low-density lesion in the pancreatic head with diffuse atrophy of the body and tail. On admission WBC 5.2, hemoglobin 10.4/hematocrit 30.3/platelets 111 Potassium 3.2/creatinine 1.6/LFTs within normal limits blood sugars improved now in the 1 50-2 50 range  Patient says she has been having intermittent abdominal pain over the past year in her mid abdomen and has noticed intermittent very loose to pured type consistency stools and has also noted "oily stools".  Appetite has been okay weight fairly stable does have occasional nausea but no vomiting. Patient has not been aware of any prior diagnosis of cirrhosis, no history of EtOH use, no family history of liver disease. She is feeling better since admission and with improved glucose control.   Past Medical History:  Diagnosis Date    acute myeloblastic leukemia (AML) dx'd 06/2014   Acute myelogenous leukemia ([place]) 02/2014   Acute pancreatitis    Arthritis    "back" (09/17/2017)   Atrial flutter (HCC)    Cardiomyopathy (HCC)    Chronic kidney disease, stage II (mild)    Chronic lower back pain    Colon polyps 04/29/2010   TUBULAR ADENOMA AND A SERRATED ADENOMA   COPD (chronic obstructive pulmonary disease) ([place])    CORONARY ARTERY DISEASE 12/24/2007   DIABETES MELLITUS, TYPE II 07/13/2007   Family history of cervical cancer    Family history of esophageal cancer    Family history of leukemia    Family history of lung cancer    Family history of skin cancer    History of blood transfusion 2015   "related to leukemia"   History of kidney stones 12/24/2007   History of uterine cancer    HYPERLIPIDEMIA 12/24/2007   HYPERTENSION 07/13/2007   NSTEMI (non-ST elevated myocardial infarction) ([place]) 09/17/2017   Unspecified disease of pancreas    Uterine cancer ([place])     Past Surgical History:  Procedure Laterality Date   ABDOMINAL HYSTERECTOMY     "still have my ovaries"   BREAST BIOPSY Left 09/22/2019   BREAST LUMPECTOMY Left 10/24/2019   BREAST LUMPECTOMY WITH RADIOACTIVE SEED LOCALIZATION Left 10/24/2019   Procedure: LEFT BREAST LUMPECTOMY WITH RADIOACTIVE SEED LOCALIZATION;  Surgeon: Jovita Kussmaul, MD;  Location: [facility];  Service: General;  Laterality: Left;   CARDIAC CATHETERIZATION  2002   CARDIAC CATHETERIZATION  09/17/2017   CATARACT EXTRACTION W/ INTRAOCULAR LENS  IMPLANT, BILATERAL Bilateral    COLONOSCOPY W/ BIOPSIES AND  POLYPECTOMY  2011   CORONARY STENT INTERVENTION N/A 09/21/2017   Procedure: CORONARY STENT INTERVENTION;  Surgeon: Troy Sine, MD;  Location: [facility];  Service: Cardiovascular;  Laterality: N/A;   DILATION AND CURETTAGE OF UTERUS     EXCISIONAL HEMORRHOIDECTOMY  X 2   GANGLION CYST EXCISION Left X 3   INTRAVASCULAR PRESSURE WIRE/FFR STUDY N/A 09/17/2017    Procedure: INTRAVASCULAR PRESSURE WIRE/FFR STUDY;  Surgeon: Nelva Bush, MD;  Location: [facility];  Service: Cardiovascular;  Laterality: N/A;   NASAL SINUS SURGERY     PORTA CATH INSERTION Right 2013   RIGHT/LEFT HEART CATH AND CORONARY ANGIOGRAPHY N/A 09/17/2017   Procedure: RIGHT/LEFT HEART CATH AND CORONARY ANGIOGRAPHY;  Surgeon: Nelva Bush, MD;  Location: [place] [facility];  Service: Cardiovascular;  Laterality: N/A;   TUBAL LIGATION      Prior to Admission medications   Medication Sig Start Date End Date Taking? Authorizing Provider  amLODipine (NORVASC) 10 MG tablet Take 1 tablet (10 mg total) by mouth daily. 07/11/21  Yes Isaac Bliss, Rayford Halsted, MD  aspirin EC 81 MG tablet Take 1 tablet (81 mg total) by mouth daily. 08/17/18  Yes Weaver, Scott T, PA-C  atorvastatin (LIPITOR) 40 MG tablet Take 1 tablet (40 mg total) by mouth daily. 07/11/21  Yes Isaac Bliss, Rayford Halsted, MD  Calcium Carbonate-Vit D-Min (CALTRATE 600+D PLUS MINERALS) 600-800 MG-UNIT TABS Take 1 tablet by mouth daily. 11/02/19  Yes Ladell Pier, MD  clopidogrel (PLAVIX) 75 MG tablet Take 1 tablet (75 mg total) by mouth every evening. 07/11/21  Yes Isaac Bliss, Rayford Halsted, MD  cyanocobalamin (,VITAMIN B-12,) 1000 MCG/ML injection INJECT 1ML IN THE MUSCLE EVERY 30 DAYS AS DIRECTED 07/11/21  Yes Isaac Bliss, Rayford Halsted, MD  fluticasone furoate-vilanterol (BREO ELLIPTA) 200-25 MCG/INH AEPB Inhale 1 puff into the lungs daily. 10/27/18  Yes Isaac Bliss, Rayford Halsted, MD  HYDROcodone-acetaminophen (NORCO) 5-325 MG tablet Take 1 tablet by mouth every 6 (six) hours as needed for moderate pain. 08/16/21  Yes Isaac Bliss, Rayford Halsted, MD  insulin aspart (NOVOLOG FLEXPEN) 100 UNIT/ML FlexPen Max daily 60 units. Using sliding scale Patient taking differently: Inject 10 Units into the skin daily with supper. Max daily 60 units. Using sliding scale pt did state she was taking 60 units 04/12/21  Yes Shamleffer,  Melanie Crazier, MD  insulin glargine, 2 Unit Dial, (TOUJEO MAX SOLOSTAR) 300 UNIT/ML Solostar Pen Inject 16 Units into the skin at bedtime. 04/10/21  Yes Shamleffer, Melanie Crazier, MD  isosorbide mononitrate (IMDUR) 30 MG 24 hr tablet Take one and half tab daliy 07/11/21  Yes Isaac Bliss, Rayford Halsted, MD  letrozole [facility]) 2.5 MG tablet Take 1 tablet by mouth daily. 02/24/20  Yes [provider]  LORazepam (ATIVAN) 0.5 MG tablet TAKE 1 TABLET BY MOUTH EVERY 8 HOURS AS NEEDED FOR ANXIETY(DO NOT EXCEED MORE THAN 2 TABS IN 24 HRS) Patient taking differently: Take 0.5 mg by mouth every 8 (eight) hours as needed. TAKE 1 TABLET BY MOUTH EVERY 8 HOURS AS NEEDED FOR ANXIETY(DO NOT EXCEED MORE THAN 2 TABS IN 24 HRS) 08/16/21  Yes Isaac Bliss, Rayford Halsted, MD  MAGNESIUM OXIDE 400 PO Take 400 mg by mouth 2 (two) times daily. 06/08/18  Yes [provider]  metoprolol succinate (TOPROL-XL) 100 MG 24 hr tablet Take 1 tablet (100 mg total) by mouth daily. 07/17/21  Yes Isaac Bliss, Rayford Halsted, MD  polyethylene glycol (MIRALAX / GLYCOLAX) 17 g  packet as needed.   Yes [provider]  potassium chloride (KLOR-CON) 10 MEQ tablet Take 1 tablet (10 mEq total) by mouth daily. 07/11/21  Yes Isaac Bliss, Rayford Halsted, MD  promethazine (PHENERGAN) 12.5 MG tablet Take 2 tablets (25 mg total) by mouth every 6 (six) hours as needed for nausea. 05/10/13  Yes [name], PA-C  torsemide (DEMADEX) 20 MG tablet Take 40 mg in the morning and 20 mg in the afternoon 11/19/20  Yes Nahser, Wonda Cheng, MD  Blood Glucose Monitoring Suppl (TRUE METRIX GO GLUCOSE METER) w/Device KIT 30 Units by Does not apply route daily. 11/08/18   Isaac Bliss, Rayford Halsted, MD  Continuous Blood Gluc Sensor (DEXCOM G6 SENSOR) MISC 1 Device by Does not apply route as directed. 04/10/21   Shamleffer, Melanie Crazier, MD  Continuous Blood Gluc Transmit (DEXCOM G6 TRANSMITTER) MISC 1 Device by Does not apply route as directed. 04/10/21    Shamleffer, Melanie Crazier, MD  COVID-19 mRNA Vac-TriS, Pfizer, (PFIZER-BIONT COVID-19 VAC-TRIS) SUSP injection Inject into the muscle. 06/03/21   Carlyle Basques, MD  glucose blood (TRUE METRIX BLOOD GLUCOSE TEST) test strip TEST BLOOD SUGAR THREE TIMES DAILY 05/23/21   Isaac Bliss, Rayford Halsted, MD  HYDROcodone-acetaminophen (NORCO/VICODIN) 5-325 MG tablet Take 1 tablet by mouth every 6 (six) hours as needed for moderate pain. 08/16/21   Isaac Bliss, Rayford Halsted, MD  HYDROcodone-acetaminophen (NORCO/VICODIN) 5-325 MG tablet Take 1 tablet by mouth every 6 (six) hours as needed for moderate pain. 08/16/21   Isaac Bliss, Rayford Halsted, MD  Insulin Pen Needle 32G X 4 MM MISC 1 Device by Does not apply route in the morning, at noon, in the evening, and at bedtime. 04/10/21   Shamleffer, Melanie Crazier, MD  letrozole [facility]) 2.5 MG tablet Take 1 tablet (2.5 mg total) by mouth daily. 07/29/21   Ladell Pier, MD  nitroGLYCERIN (NITROSTAT) 0.4 MG SL tablet PLACE 1 TABLET (0.4 MG TOTAL) UNDER THE TONGUE EVERY 5 (FIVE) MINUTES AS NEEDED FOR CHEST PAIN. Patient not taking: Reported on 12/06/2021 05/23/21   Isaac Bliss, Rayford Halsted, MD  SYRINGE-NEEDLE, DISP, 3 ML (BD SAFETYGLIDE SYRINGE/NEEDLE) 25G X 1" 3 ML MISC Use for B12 injections 01/10/20   Isaac Bliss, Rayford Halsted, MD    Current Facility-Administered Medications  Medication Dose Route Frequency Provider Last Rate Last Admin   0.9 %  sodium chloride infusion   Intravenous Continuous Aline August, MD 50 mL/hr at 12/08/21 1154 New Bag at 12/08/21 1154   acetaminophen (TYLENOL) tablet 650 mg  650 mg Oral Q6H PRN Marcelyn Bruins, MD       Or   acetaminophen (TYLENOL) suppository 650 mg  650 mg Rectal Q6H PRN Marcelyn Bruins, MD       amLODipine (NORVASC) tablet 10 mg  10 mg Oral Daily Marcelyn Bruins, MD   10 mg at 12/08/21 5681   aspirin EC tablet 81 mg  81 mg Oral Daily Marcelyn Bruins, MD   81 mg at 12/08/21 0851   atorvastatin  (LIPITOR) tablet 40 mg  40 mg Oral Daily Marcelyn Bruins, MD   40 mg at 12/08/21 2751   Chlorhexidine Gluconate Cloth 2 % PADS 6 each  6 each Topical Daily Marcelyn Bruins, MD   6 each at 12/08/21 0851   dextrose 50 % solution 0-50 mL  0-50 mL Intravenous PRN Marcelyn Bruins, MD       enoxaparin (LOVENOX) injection 30 mg  30 mg Subcutaneous  Q24H Marcelyn Bruins, MD   30 mg at 12/08/21 0851   fluticasone furoate-vilanterol (BREO ELLIPTA) 200-25 MCG/ACT 1 puff  1 puff Inhalation Daily Marcelyn Bruins, MD       HYDROcodone-acetaminophen (NORCO/VICODIN) 5-325 MG per tablet 1 tablet  1 tablet Oral Q6H PRN Caren Griffins, MD   1 tablet at 12/06/21 1732   insulin aspart (novoLOG) injection 0-15 Units  0-15 Units Subcutaneous TID WC Caren Griffins, MD   5 Units at 12/08/21 1159   insulin aspart (novoLOG) injection 0-5 Units  0-5 Units Subcutaneous QHS Caren Griffins, MD   5 Units at 12/06/21 2318   insulin glargine-yfgn (SEMGLEE) injection 20 Units  20 Units Subcutaneous Daily Caren Griffins, MD   20 Units at 12/08/21 0900   isosorbide mononitrate (IMDUR) 24 hr tablet 45 mg  45 mg Oral Daily Marcelyn Bruins, MD   45 mg at 12/08/21 2841   letrozole [facility]) tablet 2.5 mg  2.5 mg Oral Daily Marcelyn Bruins, MD   2.5 mg at 12/08/21 0900   levETIRAcetam (KEPPRA) tablet 750 mg  750 mg Oral BID Gwinda Maine, MD   750 mg at 12/08/21 3244   LORazepam (ATIVAN) injection 2 mg  2 mg Intravenous Q6H PRN Lora Havens, MD   2 mg at 12/07/21 0019   LORazepam (ATIVAN) tablet 0.5 mg  0.5 mg Oral Q6H PRN Howerter, Justin B, DO   0.5 mg at 12/08/21 0056   metoprolol succinate (TOPROL-XL) 24 hr tablet 100 mg  100 mg Oral Daily Marcelyn Bruins, MD   100 mg at 12/08/21 0851   polyethylene glycol (MIRALAX / GLYCOLAX) packet 17 g  17 g Oral Daily PRN Marcelyn Bruins, MD   17 g at 12/08/21 0844   sodium chloride flush (NS) 0.9 % injection 10-40 mL  10-40 mL Intracatheter Q12H  Marcelyn Bruins, MD   10 mL at 12/08/21 0716   sodium chloride flush (NS) 0.9 % injection 10-40 mL  10-40 mL Intracatheter PRN Marcelyn Bruins, MD   10 mL at 12/07/21 2225   sodium chloride flush (NS) 0.9 % injection 3 mL  3 mL Intravenous Once Marcelyn Bruins, MD       sodium chloride flush (NS) 0.9 % injection 3 mL  3 mL Intravenous Q12H Marcelyn Bruins, MD   3 mL at 12/08/21 0102   Facility-Administered Medications Ordered in Other Encounters  Medication Dose Route Frequency Provider Last Rate Last Admin   sodium chloride 0.9 % 1,000 mL with magnesium sulfate 2 g infusion   Intravenous Continuous Ladell Pier, MD   Stopped at 08/04/14 1441    Allergies as of 12/05/2021   (No Known Allergies)    Family History  Problem Relation Age of Onset   COPD Father    Esophageal cancer Father    Breast cancer Sister 57   Lung cancer Sister    Esophageal cancer Brother    Suicidality Brother    Lung cancer Sister    Lung cancer Sister    Cervical cancer Sister    Congestive Heart Failure Mother    Heart attack Maternal Grandmother    Skin cancer Maternal Grandfather    Heart attack Maternal Grandfather    Stroke Paternal Grandmother 35   Heart attack Paternal Grandfather    Cancer Maternal Uncle        unknown type, dx. >65   Cancer Paternal Aunt  unknown type, dx. >50   Cancer Paternal Uncle        unknown type, dx. >50   Cirrhosis Maternal Uncle    Skin cancer Daughter    Leukemia Cousin        maternal first cousin; dx. >46, in remission   Cancer Niece        unknown type behind her ear, dx. 30s/40s   Colon cancer Neg Hx     Social History   Socioeconomic History   Marital status: Married    Spouse name: Not on file   Number of children: Not on file   Years of education: Not on file   Highest education level: Not on file  Occupational History   Occupation: works in a lab    Comment: makes glasses  Tobacco Use   Smoking status: Former     Packs/day: 1.00    Years: 20.00    Pack years: 20.00    Types: Cigarettes    Quit date: 12/08/2000    Years since quitting: 21.0   Smokeless tobacco: Never  Vaping Use   Vaping Use: Never used  Substance and Sexual Activity   Alcohol use: No    Alcohol/week: 0.0 standard drinks   Drug use: No   Sexual activity: Never  Other Topics Concern   Not on file  Social History Narrative   Still works full-time assembling eye glasses _0    Social Determinants of Radio broadcast assistant Strain: Not on file  Food Insecurity: Not on file  Transportation Needs: Not on file  Physical Activity: Not on file  Stress: Not on file  Social Connections: Not on file  Intimate Partner Violence: Not on file    Review of Systems: Pertinent positive and negative review of systems were noted in the above HPI section.  All other review of systems was otherwise negative.   Physical Exam: Vital signs in last 24 hours: Temp:  [97.8 F (36.6 C)-98.6 F (37 C)] 98.3 F (36.8 C) (01/01 1154) Pulse Rate:  [60-74] 65 (01/01 1154) Resp:  [15-20] 16 (01/01 1154) BP: (132-148)/(57-101) 140/88 (01/01 1154) SpO2:  [98 %-100 %] 100 % (01/01 1154) Weight:  [74.6 kg] 74.6 kg (01/01 0500) Last BM Date: 12/07/21 General:   Alert,  Well-developed, well-nourished, elderly white female pleasant and cooperative in NAD Head:  Normocephalic and atraumatic. Eyes:  Sclera clear, no icterus.   Conjunctiva pink. Ears:  Normal auditory acuity. Nose:  No deformity, discharge,  or lesions. Mouth:  No deformity or lesions.   Neck:  Supple; no masses or thyromegaly. Lungs:  Clear throughout to auscultation.   No wheezes, crackles, or rhonchi.  Heart:  Regular rate and rhythm; no murmurs, clicks, rubs,  or gallops. Abdomen:  Soft, obese, there is some mild tenderness in the mid abdomen and right mid quadrant no guarding or rebound, BS active,nonpalp mass or hsm.   Rectal: Not done Msk:  Symmetrical without gross  deformities. . Pulses:  Normal pulses noted. Extremities:  Without clubbing or edema. Neurologic:  Alert and  oriented x4;  grossly normal neurologically. Skin:  Intact without significant lesions or rashes.. Psych:  Alert and cooperative. Normal mood and affect.  Intake/Output from previous day: 12/31 0701 - 01/01 0700 In: 1099.4 [P.O.:717; I.V.:382.4] Out: -  Intake/Output this shift: Total I/O In: 237 [P.O.:237] Out: -   Lab Results: Recent Labs    12/06/21 0405 12/07/21 0147 12/08/21 0220  WBC 5.2 4.9 5.8  HGB 10.4* 10.3*  10.6*  HCT 30.3* 30.9* 30.7*  PLT 111* 114* 113*   BMET Recent Labs    12/06/21 0405 12/07/21 0147 12/08/21 0220  NA 136 138 137  K 3.2* 3.8 3.6  CL 106 108 107  CO2 24 21* 22  GLUCOSE 157* 245* 152*  BUN 27* 28* 29*  CREATININE 1.63* 2.23* 1.91*  CALCIUM 7.7* 7.6* 7.6*   LFT Recent Labs    12/08/21 0220  PROT 5.3*  ALBUMIN 2.8*  AST 23  ALT 24  ALKPHOS 117  BILITOT 0.7   PT/INR Recent Labs    12/05/21 2134  LABPROT 14.6  INR 1.1     IMPRESSION:  #52  81 year old white female admitted 12/05/2022 after presenting with concern for seizure activity and found to have severe hyperglycemia.  EEG negative #2 adult onset diabetes mellitus-insulin-dependent #3  Complaints of intermittent abdominal pain over the past year, intermittent loose stool and oily stool CT imaging shows new finding of nodular liver consistent with cirrhosis and an indeterminant right hepatic lobe lesion 1.6 cm, small gallstones and a 4.0 x 3.8 x 4.0 cm complex low-density lesion in the pancreatic head and diffuse atrophy of the pancreatic body and tail Rule out pancreatic cancer With indeterminant hepatic lesion, rule out possible mets  #4 Cirrhosis of unclear etiology-no history of EtOH, suspect nonalcoholic fatty liver disease #5 coronary artery disease status post stent 2018 #6  Chronic antiplatelet therapy on Plavix and aspirin-Plavix on hold since  admission #7 history of acute myeloid leukemia in remission #8  History of breast cancer #9  Congestive heart failure #10  Chronic kidney disease stage IV #11 Hypertension #12 History of adenomatous colon polyps #13 times consistent with Steatorrhea  Plan:  CEA, CA 19-9, AFP, PT/INR Check chronic hepatitis serologies, ANA Fecal elastase-if consistent with pancreatic insufficiency start Creon Patient will be scheduled for MRI of the abdomen/MRCP for further evaluation of pancreatic and hepatic abnormalities Findings on CT scan were discussed with the patient in detail this afternoon Consider CT-guided biopsy of the pancreatic head or EUS once she has had adequate Plavix washout.  (Last dose Plavix 12/05/2021)  GI will follow with you   Amy Esterwood  PA-C1/12/2021, 12:21 PM     Attending Physician Note   I have taken a history, reviewed the chart and examined the patient. I personally saw the patient and performed more than 50% of this encounter in conjunction with the APP. I agree with the APP's note, impression and recommendations. My additional impressions and recommendations are as follows.   Intermittent abdominal pain, intermittent loose and oily stools over the past year.  CT AP shows new finding of nodular liver consistent with cirrhosis and an indeterminant right hepatic lobe lesion 1.6 cm, small gallstones and a 4.0 x 3.8 x 4.0 cm complex low-density lesion in the pancreatic head and diffuse atrophy of the pancreatic body and tail.  Cirrhosis likely NASH, evaluate for other causes. Standard hepatic serologies.  Possible symptomatic cholelithiasis causing intermittent abdominal pain R/O pancreatic insufficiency.  Fecal elastase Complex cystic pancreatic head mass. MRI/MRCP, CA 19-9, CEA. If EUS is needed this may need to be deferred to the outpatient setting.     Lucio Edward, MD [facility] See AMION, [facility], for our on call provider

## 2021-12-08 NOTE — Evaluation (Signed)
Physical Therapy Evaluation Patient Details Name: PERNELLA ACKERLEY MRN: [id number] DOB: 02-11-1941 Today's Date: 12/08/2021  History of Present Illness  Pt is a 81 y.o. F who presents 12/05/2021 with episodes of seizure like activity along with slurred speech. LTM EEG from 12/07/2021-12/08/2021 did not show seizures. She was found to have significant hyperglycemia on presentation and started on insulin drip. MRI showing 17 mmm anterior falcine meningioma with local cortical mass-effect but no brain edema.  This is increased since prior MRI few years ago. Neurology recommended OP follow up. Significant PMH: leukemia in remission, diastolic CHF, breast CA, HTN, CKD stage IV, DM 2.  Clinical Impression  PTA, pt lives with her spouse and works part time. Pt seems to be close to her functional baseline. Ambulating 300 feet with no assistive device and negotiated 5 steps without physical difficulty. Pt endorses mildly decreased endurance. Her goal is to return to work; stated that her husband can drive her and she can sit if needed. No further acute or follow up PT needs. Thank you for this consult.       Recommendations for follow up therapy are one component of a multi-disciplinary discharge planning process, led by the attending physician.  Recommendations may be updated based on patient status, additional functional criteria and insurance authorization.  Follow Up Recommendations No PT follow up    Assistance Recommended at Discharge None  Functional Status Assessment Patient has not had a recent decline in their functional status  Equipment Recommendations  None recommended by PT    Recommendations for Other Services       Precautions / Restrictions Precautions Precautions: None Restrictions Weight Bearing Restrictions: No      Mobility  Bed Mobility               General bed mobility comments: OOB at sink upon arrival    Transfers Overall transfer level: Independent                       Ambulation/Gait Ambulation/Gait assistance: Independent Gait Distance (Feet): 300 Feet Assistive device: None Gait Pattern/deviations: Step-through pattern;Decreased stride length   Gait velocity interpretation: 1.31 - 2.62 ft/sec, indicative of limited community ambulator   General Gait Details: Mildly decreased pace  Stairs Stairs: Yes Stairs assistance: Modified independent (Device/Increase time) Stair Management: Two rails Number of Stairs: 5 General stair comments: step over step pattern  Wheelchair Mobility    Modified Rankin (Stroke Patients Only)       Balance Overall balance assessment: Mild deficits observed, not formally tested                               Standardized Balance Assessment Standardized Balance Assessment : Dynamic Gait Index   Dynamic Gait Index Level Surface: Mild Impairment Change in Gait Speed: Mild Impairment Gait with Horizontal Head Turns: Normal Gait with Vertical Head Turns: Normal Gait and Pivot Turn: Normal Step Over Obstacle: Mild Impairment Step Around Obstacles: Normal Steps: Mild Impairment Total Score: 20       Pertinent Vitals/Pain Pain Assessment: No/denies pain    Home Living Family/patient expects to be discharged to:: Private residence Living Arrangements: Spouse/significant other Available Help at Discharge: Family Type of Home: House Home Access: Stairs to enter   Technical brewer of Steps: 1   Home Layout: One level Home Equipment: Conservation officer, nature (2 wheels)      Prior Function Prior Level of Function :  Independent/Modified Independent             Mobility Comments: works Biomedical scientist        Extremity/Trunk Assessment   Upper Extremity Assessment Upper Extremity Assessment: Overall WFL for tasks assessed    Lower Extremity Assessment Lower Extremity Assessment: Overall WFL for tasks assessed       Communication    Communication: No difficulties  Cognition Arousal/Alertness: Awake/alert Behavior During Therapy: WFL for tasks assessed/performed Overall Cognitive Status: Within Functional Limits for tasks assessed                                          General Comments  VSS    Exercises     Assessment/Plan    PT Assessment Patient does not need any further PT services  PT Problem List         PT Treatment Interventions      PT Goals (Current goals can be found in the Care Plan section)  Acute Rehab PT Goals Patient Stated Goal: go back to work PT Goal Formulation: All assessment and education complete, DC therapy    Frequency     Barriers to discharge        Co-evaluation               AM-PAC PT "6 Clicks" Mobility  Outcome Measure Help needed turning from your back to your side while in a flat bed without using bedrails?: None Help needed moving from lying on your back to sitting on the side of a flat bed without using bedrails?: None Help needed moving to and from a bed to a chair (including a wheelchair)?: None Help needed standing up from a chair using your arms (e.g., wheelchair or bedside chair)?: None Help needed to walk in hospital room?: None Help needed climbing 3-5 steps with a railing? : None 6 Click Score: 24    End of Session   Activity Tolerance: Patient tolerated treatment well Patient left: in bed;with call bell/phone within reach Nurse Communication: Mobility status PT Visit Diagnosis: Other symptoms and signs involving the nervous system (R29.898)    Time: 9211-9417 PT Time Calculation (min) (ACUTE ONLY): 9 min   Charges:   PT Evaluation $PT Eval Low Complexity: [place], PT, DPT Acute Rehabilitation Services Pager [phone] Office [phone]   PAYETON GERMANI 12/08/2021, 11:22 AM

## 2021-12-08 NOTE — Progress Notes (Signed)
[facility] night cross cover note:  I was contacted by RN with the patient's request for resumption of home prn oral Ativan for anxiety, as the patient currently has order only for as needed IV Ativan for seizures.  Subsequently, I placed order for Ativan 0.5 mg p.o. every 6 hours as needed for anxiety, while maintaining order for prn IV Ativan, with the later solely for indication of seizures.     Babs Bertin, DO Hospitalist

## 2021-12-08 NOTE — Progress Notes (Signed)
vLTM discont;  [facility] notified  No skin breakdown at [place]  FP2  F7  F8

## 2021-12-08 NOTE — Progress Notes (Signed)
Patient ID: [name], female   DOB: May 26, 1941, 81 y.o.   MRN: [id number]  PROGRESS NOTE    KENNI NEWTON  TKP:[id number] DOB: 1941-09-12 DOA: 12/05/2021 PCP: Isaac Bliss, Rayford Halsted, MD   Brief Narrative:  81 year old female with history of leukemia in remission, diastolic CHF, breast cancer, hypertension, hyperlipidemia, chronic kidney disease stage IV, DM 2 presented  with episodes of seizure-like activity along with slurred speech.  Neurology was consulted.  She was found to have significant hyperglycemia on presentation and was started on insulin drip.  Assessment & Plan:   New onset seizures -Neurology following.  LTM EEG from 12/07/2021-12/08/2021 did not show seizures.  Currently on Keppra as per neurology.  Outpatient follow-up with neurology.  Neurology has cleared the patient for discharge. -For precaution/seizure precautions. -PT evaluation is pending.  Diabetes mellitus type 2 with hyperglycemia/HHS -Initially started on insulin drip on presentation for hyperglycemia.  Subsequently has been switched to subcutaneous insulin.  Continue long-acting insulin along with CBGs with SSI.  A1c 11.3.  Meningioma -MRI of the brain showed a 17 mm anterior falcine meningioma with local cortical mass-effect but no brain edema.  This is increased since prior MRI few years ago.  Neurosurgery consulted, no surgical indications currently.  Outpatient follow-up with neurosurgery  Acute kidney injury on chronic kidney disease stage IV -Baseline creatinine around 1.5.  Creatinine was 1.95 on admission, likely in the setting of dehydration due to HHS. -Creatinine was improving but worsened to 2.23 on 12/07/2021 for which she was started on IV fluids again.  Creatinine 1.91 today.  Repeat a.m. labs  Intermittent abdominal pain/diarrhea -Significant intermittent hives over the last couple of years. -CT of the abdomen and pelvis showed possible pancreatic head lesion along with cirrhotic liver.   Recommend MRI for further evaluation.  I have consulted GI to address this issue. -LFTs normal  Thrombocytopenia -Questionable cause.  Currently stable  Hypokalemia -Resolved  Anemia of chronic disease -Hemoglobin stable.  Hypertension -Blood pressure stable.  Continue amlodipine, Imdur, metoprolol succinate and torsemide  Chronic diastolic CHF -Currently euvolemic.  Patient is still on torsemide.  DC torsemide for now; repeat a.m. creatinine.  Outpatient follow-up with cardiology.  Continue Imdur and metoprolol succinate.  Strict input and output Daily weights.  Hyperlipidemia -Continue statin  History of TIA -Continue aspirin, Plavix, statin  History of AML in remission and breast cancer -Outpatient follow-up with oncology    DVT prophylaxis: Lovenox Code Status: Full Family Communication: None at bedside Disposition Plan: Status is: Inpatient  Remains inpatient appropriate because: Of severity of illness.  PT evaluation pending.  GI eval pending.  Consultants: Neurology.  Consulted GI today  Procedures: LTM EEG  Antimicrobials: None   Subjective: Patient seen and examined at bedside undergoing EEG.  No overnight seizures, nausea, vomiting, fever reported.  Still complains of intermittent abdominal pain  Objective: Vitals:   12/08/21 0000 12/08/21 0400 12/08/21 0500 12/08/21 0811  BP: (!) 148/70 (!) 142/68  (!) 132/101  Pulse: 68 63  70  Resp: 18 18  16   Temp: 98.4 F (36.9 C) 98.6 F (37 C)  98.4 F (36.9 C)  TempSrc: Oral Oral  Oral  SpO2: 99% 98%  98%  Weight:   74.6 kg   Height:        Intake/Output Summary (Last 24 hours) at 12/08/2021 1040 Last data filed at 12/07/2021 2229 Gross per 24 hour  Intake 1099.44 ml  Output --  Net 1099.44 ml   [employer]  12/05/21 2332 12/07/21 0500 12/08/21 0500  Weight: 74.8 kg 79 kg 74.6 kg    Examination:  General exam: Appears calm and comfortable.  Looks chronically ill and deconditioned.   Currently on room air. Respiratory system: Bilateral decreased breath sounds at bases Cardiovascular system: S1 & S2 heard, Rate controlled Gastrointestinal system: Abdomen is nondistended, soft and nontender. Normal bowel sounds heard. Extremities: No cyanosis, clubbing; trace lower extremity edema Central nervous system: Awake.  Slow to respond.  Poor historian.  No focal neurological deficits. Moving extremities Skin: No rashes, lesions or ulcers Psychiatry: Affect is mostly flat.   Data Reviewed: I have personally reviewed following labs and imaging studies  CBC: Recent Labs  Lab 12/05/21 2134 12/05/21 2148 12/06/21 0405 12/07/21 0147 12/08/21 0220  WBC 6.0  --  5.2 4.9 5.8  NEUTROABS 3.9  --   --   --   --   HGB 11.7* 11.9* 10.4* 10.3* 10.6*  HCT 34.4* 35.0* 30.3* 30.9* 30.7*  MCV 90.5  --  91.0 91.7 90.8  PLT 131*  --  111* 114* 161*   Basic Metabolic Panel: Recent Labs  Lab 12/05/21 2134 12/05/21 2148 12/06/21 0405 12/07/21 0147 12/08/21 0220  NA 131* 134* 136 138 137  K 3.8 3.9 3.2* 3.8 3.6  CL 101 99 106 108 107  CO2 21*  --  24 21* 22  GLUCOSE 622* 642* 157* 245* 152*  BUN 29* 30* 27* 28* 29*  CREATININE 1.95* 1.70* 1.63* 2.23* 1.91*  CALCIUM 7.7*  --  7.7* 7.6* 7.6*  MG  --   --  1.9  --   --    GFR: Estimated Creatinine Clearance: 22.6 mL/min (A) (by C-G formula based on SCr of 1.91 mg/dL (H)). Liver Function Tests: Recent Labs  Lab 12/05/21 2134 12/06/21 0405 12/08/21 0220  AST 47* 29 23  ALT 31 26 24   ALKPHOS 149* 122 117  BILITOT 0.7 0.7 0.7  PROT 5.9* 5.0* 5.3*  ALBUMIN 3.1* 2.7* 2.8*   No results for input(s): LIPASE, AMYLASE in the last 168 hours. No results for input(s): AMMONIA in the last 168 hours. Coagulation Profile: Recent Labs  Lab 12/05/21 2134  INR 1.1   Cardiac Enzymes: No results for input(s): CKTOTAL, CKMB, CKMBINDEX, TROPONINI in the last 168 hours. BNP (last 3 results) No results for input(s): PROBNP in the last  8760 hours. HbA1C: Recent Labs    12/06/21 1440  HGBA1C 11.3*   CBG: Recent Labs  Lab 12/07/21 0659 12/07/21 1232 12/07/21 1709 12/07/21 2226 12/08/21 0607  GLUCAP 193* 186* 210* 156* 105*   Lipid Profile: No results for input(s): CHOL, HDL, LDLCALC, TRIG, CHOLHDL, LDLDIRECT in the last 72 hours. Thyroid Function Tests: No results for input(s): TSH, T4TOTAL, FREET4, T3FREE, THYROIDAB in the last 72 hours. Anemia Panel: No results for input(s): VITAMINB12, FOLATE, FERRITIN, TIBC, IRON, RETICCTPCT in the last 72 hours. Sepsis Labs: No results for input(s): PROCALCITON, LATICACIDVEN in the last 168 hours.  Recent Results (from the past 240 hour(s))  Resp Panel by RT-PCR (Flu A&B, Covid) Nasopharyngeal Swab     Status: None   Collection Time: 12/05/21  9:48 PM   Specimen: Nasopharyngeal Swab; Nasopharyngeal(NP) swabs in vial transport medium  Result Value Ref Range Status   SARS Coronavirus 2 by RT PCR NEGATIVE NEGATIVE Final    Comment: (NOTE) SARS-CoV-2 target nucleic acids are NOT DETECTED.  The SARS-CoV-2 RNA is generally detectable in upper respiratory specimens during the acute phase of infection. The  lowest concentration of SARS-CoV-2 viral copies this assay can detect is 138 copies/mL. A negative result does not preclude SARS-Cov-2 infection and should not be used as the sole basis for treatment or other patient management decisions. A negative result may occur with  improper specimen collection/handling, submission of specimen other than nasopharyngeal swab, presence of viral mutation(s) within the areas targeted by this assay, and inadequate number of viral copies(<138 copies/mL). A negative result must be combined with clinical observations, patient history, and epidemiological information. The expected result is Negative.  Fact Sheet for Patients:  EntrepreneurPulse.com.au  Fact Sheet for Healthcare Providers:   IncredibleEmployment.be  This test is no t yet approved or cleared by the [place] FDA and  has been authorized for detection and/or diagnosis of SARS-CoV-2 by FDA under [name] Emergency Use Authorization (EUA). This EUA will remain  in effect (meaning this test can be used) for the duration of the COVID-19 declaration under Section 564(b)(1) of the Act, 21 U.S.C.section 360bbb-3(b)(1), unless the authorization is terminated  or revoked sooner.       Influenza A by PCR NEGATIVE NEGATIVE Final   Influenza B by PCR NEGATIVE NEGATIVE Final    Comment: (NOTE) The Xpert Xpress SARS-CoV-2/FLU/RSV plus assay is intended as [name] aid in the diagnosis of influenza from Nasopharyngeal swab specimens and should not be used as a sole basis for treatment. Nasal washings and aspirates are unacceptable for Xpert Xpress SARS-CoV-2/FLU/RSV testing.  Fact Sheet for Patients: EntrepreneurPulse.com.au  Fact Sheet for Healthcare Providers: IncredibleEmployment.be  This test is not yet approved or cleared by the [place] FDA and has been authorized for detection and/or diagnosis of SARS-CoV-2 by FDA under [name] Emergency Use Authorization (EUA). This EUA will remain in effect (meaning this test can be used) for the duration of the COVID-19 declaration under Section 564(b)(1) of the Act, 21 U.S.C. section 360bbb-3(b)(1), unless the authorization is terminated or revoked.  Performed at [facility], [place] [address] [place], [place] [address]          Radiology Studies: CT ABDOMEN PELVIS WO CONTRAST  Result Date: 12/07/2021 CLINICAL DATA:  Acute abdominal pain. EXAM: CT ABDOMEN AND PELVIS WITHOUT CONTRAST TECHNIQUE: Multidetector CT imaging of the abdomen and pelvis was performed following the standard protocol without IV contrast. COMPARISON:  CT abdomen and pelvis 02/23/2014. FINDINGS: Lower chest: Small nodular densities in the  lung bases are unchanged from the prior examination 2015 and favored as benign. There is a trace right pleural effusion. Hepatobiliary: There is nodular liver contour. There is [name] indeterminate hypodense lesion in the inferior right lobe of the liver measuring 16 mm image 3/34. Small gallstones are present. There is no biliary ductal dilatation. Pancreas: There is a 4.0 x 3.8 x 4.4 cm complex low-density lesion in the head of the pancreas with coarse peripheral calcifications. There is marked diffuse atrophy of the pancreatic body and tail with pancreatic ductal dilatation measuring 8 mm. These findings are new from 2015. these findings were likely present on 05/08/2020. Spleen: Normal in size without focal abnormality. Adrenals/Urinary Tract: There is a punctate nonobstructing left renal calculus measuring 3 mm. There is no hydronephrosis. There is a subcentimeter hyperdense lesion in the superior pole the left kidney image 6/73 which is indeterminate. There is likely a small cyst in the lateral left kidney. Adrenal glands and bladder are within normal limits. Stomach/Bowel: Stomach is within normal limits. Appendix appears normal. No evidence of bowel wall thickening, distention, or inflammatory changes. Vascular/Lymphatic:  Aortic atherosclerosis. No enlarged abdominal or pelvic lymph nodes. Reproductive: Status post hysterectomy. No adnexal masses. Other: There is a small fat containing umbilical hernia. There is no ascites or free air. There is mild body wall edema. Musculoskeletal: Diffuse mixed lytic and sclerotic osseous metastatic lesions present. There are no acute fractures identified. IMPRESSION: 1. Pancreatic head lesion appears cystic and complex (measuring up to 4.4 cm), indeterminate. Recommend further evaluation with pancreatic MRI. There is diffuse pancreatic body and tail atrophy and ductal dilatation. 2. Cirrhotic liver. Indeterminate hypodense lesion in the inferior right lobe of the liver.  Recommend further evaluation with MRI. 3. Cholelithiasis. 4. Nonobstructing left renal calculus. 5. Mild body wall edema and trace right pleural effusion. 6.  Aortic Atherosclerosis (ICD10-I70.0). 7. Diffuse osseous metastatic disease. Electronically Signed   By: Ronney Asters M.D.   On: 12/07/2021 20:58   EEG adult  Result Date: 12/06/2021 Lora Havens, MD     12/06/2021 11:26 AM Patient Name: [name] MRN: [id number] Epilepsy Attending: Lora Havens Referring Physician/Provider: Dr Kerney Elbe Date: 12/06/2021 Duration: 20.59 mins Patient history: 81 year old female with seizure-like episodes, not on antiseizure medications.  MRI brain showed anterior falcine meningioma with mass-effect but no edema as well as blood glucose of 541. EEG to evaluate for seizure Level of alertness: Awake, asleep AEDs during EEG study: LEV Technical aspects: This EEG study was done with scalp electrodes positioned according to the 10-20 International system of electrode placement. Electrical activity was acquired at a sampling rate of 500Hz  and reviewed with a high frequency filter of 70Hz  and a low frequency filter of 1Hz . EEG data were recorded continuously and digitally stored. Description: The posterior dominant rhythm consists of 9Hz  activity of moderate voltage (25-35 uV) seen predominantly in posterior head regions, symmetric and reactive to eye opening and eye closing. Sleep was characterized by vertex waves, sleep spindles (12 to 14 Hz), maximal frontocentral region. Hyperventilation and photic stimulation were not performed.   IMPRESSION: This study is within normal limits. No seizures or epileptiform discharges were seen throughout the recording. Priyanka Barbra Sarks   Overnight EEG with video  Result Date: 12/07/2021 Lora Havens, MD     12/08/2021  7:28 AM Patient Name: [name] MRN: [id number] Epilepsy Attending: Lora Havens Referring Physician/Provider: Dr Kerney Elbe Duration:  12/06/2021 1128 to 12/07/2021 1128  Patient history: 81 year old female with seizure-like episodes, not on antiseizure medications.  MRI brain showed anterior falcine meningioma with mass-effect but no edema as well as blood glucose of 541. EEG to evaluate for seizure  Level of alertness: Awake, asleep  AEDs during EEG study: LEV  Technical aspects: This EEG study was done with scalp electrodes positioned according to the 10-20 International system of electrode placement. Electrical activity was acquired at a sampling rate of 500Hz  and reviewed with a high frequency filter of 70Hz  and a low frequency filter of 1Hz . EEG data were recorded continuously and digitally stored.  Description: The posterior dominant rhythm consists of 9Hz  activity of moderate voltage (25-35 uV) seen predominantly in posterior head regions, symmetric and reactive to eye opening and eye closing. Sleep was characterized by vertex waves, sleep spindles (12 to 14 Hz), maximal frontocentral region. Intermittent left temporal 6-7hz  theta slowing was noted One seizure without clinical signs was noted on 12/06/2021 at 2237. EEG showed 6-7hz  theta slowing in left temporal region which then involved all of left hemisphere and right temporal region. EEG then showed 3-4hz  delta slowing. Duration  of seizure was about 2 minutes. Hyperventilation and photic stimulation were not performed.   ABNORMALITY - Seizure without clinical signs, left temporal region - Intermittent slow, left temporal region  IMPRESSION: This study showed one seizure without clinical signs on 12/06/2021 at 2237 arising from left temporal region, lasting about 3 minutes. There is also cortical dysfunction in left temporal region likely secondary to underlying structural abnormality.  Priyanka Barbra Sarks        Scheduled Meds:  amLODipine  10 mg Oral Daily   aspirin EC  81 mg Oral Daily   atorvastatin  40 mg Oral Daily   Chlorhexidine Gluconate Cloth  6 each Topical Daily    enoxaparin (LOVENOX) injection  30 mg Subcutaneous Q24H   fluticasone furoate-vilanterol  1 puff Inhalation Daily   insulin aspart  0-15 Units Subcutaneous TID WC   insulin aspart  0-5 Units Subcutaneous QHS   insulin glargine-yfgn  20 Units Subcutaneous Daily   isosorbide mononitrate  45 mg Oral Daily   letrozole  2.5 mg Oral Daily   levETIRAcetam  750 mg Oral BID   metoprolol succinate  100 mg Oral Daily   sodium chloride flush  10-40 mL Intracatheter Q12H   sodium chloride flush  3 mL Intravenous Once   sodium chloride flush  3 mL Intravenous Q12H   torsemide  20 mg Oral Q supper   torsemide  40 mg Oral Q breakfast   Continuous Infusions:  sodium chloride Stopped (12/08/21 0836)          Aline August, MD [facility] 12/08/2021, 10:40 AM

## 2021-12-09 ENCOUNTER — Inpatient Hospital Stay (HOSPITAL_COMMUNITY): Payer: Medicare HMO

## 2021-12-09 DIAGNOSIS — R933 Abnormal findings on diagnostic imaging of other parts of digestive tract: Secondary | ICD-10-CM

## 2021-12-09 DIAGNOSIS — K869 Disease of pancreas, unspecified: Secondary | ICD-10-CM

## 2021-12-09 DIAGNOSIS — K8689 Other specified diseases of pancreas: Secondary | ICD-10-CM

## 2021-12-09 LAB — COMPREHENSIVE METABOLIC PANEL
ALT: 25 U/L (ref 0–44)
AST: 27 U/L (ref 15–41)
Albumin: 2.6 g/dL — ABNORMAL LOW (ref 3.5–5.0)
Alkaline Phosphatase: 107 U/L (ref 38–126)
Anion gap: 8 (ref 5–15)
BUN: 23 mg/dL (ref 8–23)
CO2: 21 mmol/L — ABNORMAL LOW (ref 22–32)
Calcium: 7.6 mg/dL — ABNORMAL LOW (ref 8.9–10.3)
Chloride: 108 mmol/L (ref 98–111)
Creatinine, Ser: 1.67 mg/dL — ABNORMAL HIGH (ref 0.44–1.00)
GFR, Estimated: 31 mL/min — ABNORMAL LOW (ref >60)
Glucose, Bld: 213 mg/dL — ABNORMAL HIGH (ref 70–99)
Potassium: 3.6 mmol/L (ref 3.5–5.1)
Sodium: 137 mmol/L (ref 135–145)
Total Bilirubin: 0.8 mg/dL (ref 0.3–1.2)
Total Protein: 5 g/dL — ABNORMAL LOW (ref 6.5–8.1)

## 2021-12-09 LAB — GLUCOSE, CAPILLARY
Glucose-Capillary: 193 mg/dL — ABNORMAL HIGH (ref 70–99)
Glucose-Capillary: 239 mg/dL — ABNORMAL HIGH (ref 70–99)
Glucose-Capillary: 195 mg/dL — ABNORMAL HIGH (ref 70–99)

## 2021-12-09 LAB — MAGNESIUM: Magnesium: 1.7 mg/dL (ref 1.7–2.4)

## 2021-12-09 MED ORDER — HEPARIN SOD (PORK) LOCK FLUSH 100 UNIT/ML IV SOLN
500.0000 [IU] | INTRAVENOUS | Status: AC | PRN
  Administered 2021-12-09: 500 [IU]
  Filled 2021-12-09: qty 5

## 2021-12-09 MED ORDER — LEVETIRACETAM 750 MG PO TABS: 750.0000 mg | ORAL_TABLET | Freq: Two times a day (BID) | ORAL | 0 refills | Status: DC

## 2021-12-09 MED ORDER — GADOBUTROL 1 MMOL/ML IV SOLN
7.4000 mL | Freq: Once | INTRAVENOUS | Status: AC | PRN
  Administered 2021-12-09: 7.4 mL via INTRAVENOUS

## 2021-12-09 NOTE — Discharge Summary (Signed)
Physician Discharge Summary  [name] HLK:[id number] DOB: Oct 30, 1941 DOA: 12/05/2021  PCP: Isaac Bliss, Rayford Halsted, MD  Admit date: 12/05/2021 Discharge date: 12/09/2021  Admitted From: Home Disposition: Home  Recommendations for Outpatient Follow-up:  Follow up with PCP in 1 week with repeat CBC/CMP Outpatient follow-up with neurology, cardiology and gastroenterology Follow up in ED if symptoms worsen or new appear   Home Health: No Equipment/Devices: None  Discharge Condition: Stable CODE STATUS: Full Diet recommendation: Heart healthy  Brief/Interim Summary: 81 year old female with history of leukemia in remission, diastolic CHF, breast cancer, hypertension, hyperlipidemia, chronic kidney disease stage IV, DM 2 presented  with episodes of seizure-like activity along with slurred speech.  Neurology was consulted.  She was found to have significant hyperglycemia on presentation and was started on insulin drip.  Subsequently, she was switched to long-acting insulin.  Neurology recommended oral Keppra 750 mg twice a day with outpatient follow-up with neurology.  Neurology has signed off.  She was found to have pancreatic head cystic mass: GI recommended outpatient follow-up.  GI has signed off.  Her kidney function has improved.  She feels better and wants to go home today.  She will be discharged home today.  Discharge Diagnoses:   New onset seizures -LTM EEG from 12/07/2021-12/08/2021 did not show seizures.  Currently on Keppra as per neurology.  Neurology signed off on 12/08/2021.  Outpatient follow-up with neurology.  Neurology has cleared the patient for discharge. --PT recommended no PT follow-up. -Continue current dose of Keppra upon discharge.   Diabetes mellitus type 2 with hyperglycemia/HHS -Initially started on insulin drip on presentation for hyperglycemia.  Subsequently has been switched to subcutaneous insulin.  Continue home regimen.  Carb modified diet.  Outpatient  follow-up.  A1c 11.3.   Meningioma -MRI of the brain showed a 17 mm anterior falcine meningioma with local cortical mass-effect but no brain edema.  This is increased since prior MRI few years ago.  Neurosurgery consulted, no surgical indications currently.  Outpatient follow-up with neurosurgery   Acute kidney injury on chronic kidney disease stage IV -Baseline creatinine around 1.5.  Creatinine was 1.95 on admission, likely in the setting of dehydration due to HHS. -Creatinine was improving but worsened to 2.23 on 12/07/2021 for which she was started on IV fluids again.  Creatinine 1.67 today.  Currently on gentle hydration.  Outpatient follow-up of creatinine.    Intermittent abdominal pain/diarrhea Cystic pancreatic Mass Cirrhosis of liver, suspected NASH -Significant intermittent hives over the last couple of years. -CT of the abdomen and pelvis showed possible pancreatic head lesion along with cirrhotic liver.  MRI of abdomen showed cystic pancreatic head mass with cirrhosis of liver.  GI recommends outpatient follow-up.  Discharge patient home today. -LFTs normal   Thrombocytopenia -Questionable cause.  Currently stable   Hypokalemia -Resolved   Anemia of chronic disease -Hemoglobin stable.   Hypertension -Blood pressure stable.  Continue amlodipine, Imdur, metoprolol succinate   Chronic diastolic CHF -Currently euvolemic.  Outpatient follow-up with cardiology.  Continue Imdur and metoprolol succinate.  Strict input and output Daily weights. -Torsemide on hold for now till reevaluation by PCP.   Hyperlipidemia -Continue statin  History of TIA -Continue aspirin, Plavix, statin  History of AML in remission and breast cancer -Outpatient follow-up with oncology   Discharge Instructions  Discharge Instructions     Ambulatory referral to Cardiology   Complete by: As directed    Ambulatory referral to Gastroenterology   Complete by: As directed  Ambulatory referral  to Neurology   Complete by: As directed    An appointment is requested in approximately: 2 weeks   Diet - low sodium heart healthy   Complete by: As directed    Diet Carb Modified   Complete by: As directed    Increase activity slowly   Complete by: As directed       Allergies as of 12/09/2021   No Known Allergies      Medication List     STOP taking these medications    promethazine 12.5 MG tablet Commonly known as: PHENERGAN   torsemide 20 MG tablet Commonly known as: DEMADEX       TAKE these medications    amLODipine 10 MG tablet Commonly known as: NORVASC Take 1 tablet (10 mg total) by mouth daily.   aspirin EC 81 MG tablet Take 1 tablet (81 mg total) by mouth daily.   atorvastatin 40 MG tablet Commonly known as: LIPITOR Take 1 tablet (40 mg total) by mouth daily.   BD SafetyGlide Syringe/Needle 25G X 1" 3 ML Misc Generic drug: SYRINGE-NEEDLE (DISP) 3 ML Use for B12 injections   Caltrate 600+D Plus Minerals 600-800 MG-UNIT Tabs Take 1 tablet by mouth daily.   clopidogrel 75 MG tablet Commonly known as: PLAVIX Take 1 tablet (75 mg total) by mouth every evening.   cyanocobalamin 1000 MCG/ML injection Commonly known as: (VITAMIN B-12) INJECT 1ML IN THE MUSCLE EVERY 30 DAYS AS DIRECTED   Dexcom G6 Sensor Misc 1 Device by Does not apply route as directed.   Dexcom G6 Transmitter Misc 1 Device by Does not apply route as directed.   fluticasone furoate-vilanterol 200-25 MCG/INH Aepb Commonly known as: Breo Ellipta Inhale 1 puff into the lungs daily.   HYDROcodone-acetaminophen 5-325 MG tablet Commonly known as: Norco Take 1 tablet by mouth every 6 (six) hours as needed for moderate pain. What changed: Another medication with the same name was removed. Continue taking this medication, and follow the directions you see here.   Insulin Pen Needle 32G X 4 MM Misc 1 Device by Does not apply route in the morning, at noon, in the evening, and at  bedtime.   isosorbide mononitrate 30 MG 24 hr tablet Commonly known as: IMDUR Take one and half tab daliy   letrozole 2.5 MG tablet Commonly known as: FEMARA Take 1 tablet (2.5 mg total) by mouth daily. What changed: Another medication with the same name was removed. Continue taking this medication, and follow the directions you see here.   levETIRAcetam 750 MG tablet Commonly known as: Keppra Take 1 tablet (750 mg total) by mouth 2 (two) times daily.   LORazepam 0.5 MG tablet Commonly known as: ATIVAN TAKE 1 TABLET BY MOUTH EVERY 8 HOURS AS NEEDED FOR ANXIETY(DO NOT EXCEED MORE THAN 2 TABS IN 24 HRS) What changed:  how much to take how to take this when to take this reasons to take this   MAGNESIUM OXIDE 400 PO Take 400 mg by mouth 2 (two) times daily.   metoprolol succinate 100 MG 24 hr tablet Commonly known as: TOPROL-XL Take 1 tablet (100 mg total) by mouth daily.   nitroGLYCERIN 0.4 MG SL tablet Commonly known as: NITROSTAT PLACE 1 TABLET (0.4 MG TOTAL) UNDER THE TONGUE EVERY 5 (FIVE) MINUTES AS NEEDED FOR CHEST PAIN.   NovoLOG FlexPen 100 UNIT/ML FlexPen Generic drug: insulin aspart Max daily 60 units. Using sliding scale What changed:  how much to take how to take  this when to take this additional instructions   Pfizer-BioNT COVID-19 Vac-TriS Susp injection Generic drug: COVID-19 mRNA Vac-TriS (Pfizer) Inject into the muscle.   polyethylene glycol 17 g packet Commonly known as: MIRALAX / GLYCOLAX as needed.   potassium chloride 10 MEQ tablet Commonly known as: KLOR-CON M Take 1 tablet (10 mEq total) by mouth daily.   Toujeo Max SoloStar 300 UNIT/ML Solostar Pen Generic drug: insulin glargine (2 Unit Dial) Inject 16 Units into the skin at bedtime.   True Metrix Blood Glucose Test test strip Generic drug: glucose blood TEST BLOOD SUGAR THREE TIMES DAILY   True Metrix Go Glucose Meter w/Device Kit 30 Units by Does not apply route daily.         Follow-up Information     Isaac Bliss, Rayford Halsted, MD. Schedule an appointment as soon as possible for a visit in 1 week(s).   Specialty: Internal Medicine Contact information: [place] [address] [phone]         Nahser, Wonda Cheng, MD .   Specialty: Cardiology Contact information: [address] [place] [address] [phone]                No Known Allergies  Consultations: Neurology/GI   Procedures/Studies: CT ABDOMEN PELVIS WO CONTRAST  Result Date: 12/07/2021 CLINICAL DATA:  Acute abdominal pain. EXAM: CT ABDOMEN AND PELVIS WITHOUT CONTRAST TECHNIQUE: Multidetector CT imaging of the abdomen and pelvis was performed following the standard protocol without IV contrast. COMPARISON:  CT abdomen and pelvis 02/23/2014. FINDINGS: Lower chest: Small nodular densities in the lung bases are unchanged from the prior examination 2015 and favored as benign. There is a trace right pleural effusion. Hepatobiliary: There is nodular liver contour. There is an indeterminate hypodense lesion in the inferior right lobe of the liver measuring 16 mm image 3/34. Small gallstones are present. There is no biliary ductal dilatation. Pancreas: There is a 4.0 x 3.8 x 4.4 cm complex low-density lesion in the head of the pancreas with coarse peripheral calcifications. There is marked diffuse atrophy of the pancreatic body and tail with pancreatic ductal dilatation measuring 8 mm. These findings are new from 2015. these findings were likely present on 05/08/2020. Spleen: Normal in size without focal abnormality. Adrenals/Urinary Tract: There is a punctate nonobstructing left renal calculus measuring 3 mm. There is no hydronephrosis. There is a subcentimeter hyperdense lesion in the superior pole the left kidney image 6/73 which is indeterminate. There is likely a small cyst in the lateral left kidney. Adrenal glands and bladder are within normal limits.  Stomach/Bowel: Stomach is within normal limits. Appendix appears normal. No evidence of bowel wall thickening, distention, or inflammatory changes. Vascular/Lymphatic: Aortic atherosclerosis. No enlarged abdominal or pelvic lymph nodes. Reproductive: Status post hysterectomy. No adnexal masses. Other: There is a small fat containing umbilical hernia. There is no ascites or free air. There is mild body wall edema. Musculoskeletal: Diffuse mixed lytic and sclerotic osseous metastatic lesions present. There are no acute fractures identified. IMPRESSION: 1. Pancreatic head lesion appears cystic and complex (measuring up to 4.4 cm), indeterminate. Recommend further evaluation with pancreatic MRI. There is diffuse pancreatic body and tail atrophy and ductal dilatation. 2. Cirrhotic liver. Indeterminate hypodense lesion in the inferior right lobe of the liver. Recommend further evaluation with MRI. 3. Cholelithiasis. 4. Nonobstructing left renal calculus. 5. Mild body wall edema and trace right pleural effusion. 6.  Aortic Atherosclerosis (ICD10-I70.0). 7. Diffuse osseous metastatic disease. Electronically Signed  By: Ronney Asters M.D.   On: 12/07/2021 20:58   CT HEAD WO CONTRAST  Result Date: 12/05/2021 CLINICAL DATA:  Slurred speech. EXAM: CT HEAD WITHOUT CONTRAST TECHNIQUE: Contiguous axial images were obtained from the base of the skull through the vertex without intravenous contrast. COMPARISON:  November 21, 2017 FINDINGS: Brain: There is mild cerebral atrophy with widening of the extra-axial spaces and ventricular dilatation. There are areas of decreased attenuation within the white matter tracts of the supratentorial brain, consistent with microvascular disease changes. A stable 1.5 cm x 1.2 cm x 1.6 cm calcified meningioma is seen along the anterior falx. Vascular: No hyperdense vessels are identified. Skull: Normal. Negative for fracture or focal lesion. Sinuses/Orbits: No acute finding. Other: None.  IMPRESSION: 1. Generalized cerebral atrophy. 2. No acute intracranial abnormality. 3. Stable calcified meningioma along the anterior falx. Electronically Signed   By: Virgina Norfolk M.D.   On: 12/05/2021 22:18   MR BRAIN WO CONTRAST  Result Date: 12/06/2021 CLINICAL DATA:  Neuro deficit with acute stroke suspected. Transient slurred speech. Possible seizure like symptoms. EXAM: MRI HEAD WITHOUT CONTRAST TECHNIQUE: Multiplanar, multiecho pulse sequences of the brain and surrounding structures were obtained without intravenous contrast. COMPARISON:  Head CT from yesterday FINDINGS: Brain: No acute infarction, hemorrhage, hydrocephalus, extra-axial collection or mass effect. Anterior falx meningioma measuring 17 mm. Local cortical mass effect without brain edema. Chronic small vessel ischemia in the deep white matter, mild to moderate for age. Age normal brain volume Vascular: Normal flow voids Skull and upper cervical spine: Normal marrow signal Sinuses/Orbits: Bilateral cataract resection. IMPRESSION: 1. Aging brain without acute finding or explanation for symptoms. 2. 17 mm anterior falcine meningioma.  No adjacent brain edema. Electronically Signed   By: Jorje Guild M.D.   On: 12/06/2021 05:54   MR ABDOMEN W WO CONTRAST  Result Date: 12/09/2021 CLINICAL DATA:  Acute pain. EXAM: MRI ABDOMEN WITHOUT AND WITH CONTRAST TECHNIQUE: Multiplanar multisequence MR imaging of the abdomen was performed both before and after the administration of intravenous contrast. CONTRAST:  7.62mL GADAVIST GADOBUTROL 1 MMOL/ML IV SOLN COMPARISON:  CT AP 12/07/2021 and CT AP 02/23/2014 FINDINGS: Lower chest: Small pleural effusions noted. Hepatobiliary: The liver has a cirrhotic appearance with diffuse irregular nodular contour. Hypertrophy of the caudate lobe is also noted. No suspicious enhancing liver lesions. Multiple stones are identified layering within the dependent portion of the gallbladder measuring up to 5 mm. No  gallbladder wall thickening or pericholecystic inflammation. Common bile duct appears nondilated. Pancreas: Increased caliber of the main duct at the level of the pancreatic neck measures 8 mm, image 19/7. This communicates with a cystic lesion within the head of pancreas measuring 4.6 x 3.4 by 4.2 cm., new from 02/23/2014. This has multiple, discontinuous mural calcifications as seen on the CT from 12/07/2021. Just before the ampulla the distal duct measures 8 mm and there is a small calcification identified measuring 4 mm. No internal areas of enhancing septation or mural nodule identified at this time. There is atrophy of the body and tail of pancreas. Spleen:  Within normal limits in size and appearance. Adrenals/Urinary Tract: Normal appearance of the adrenal glands. Bilateral renal cortical thinning with multiple small cysts are identified. Cysts measure up to 1.4 cm. No hydronephrosis. Stomach/Bowel: No dilated bowel loops identified. Vascular/Lymphatic: Aortic atherosclerosis. No aneurysm. Upper abdominal vasculature is patent. Increase caliber of the extrahepatic portal vein measures 1.7 cm. No adenopathy. Other:  Trace perihepatic ascites.  No focal fluid collections. Musculoskeletal:  No suspicious bone lesions identified. IMPRESSION: 1. Large, cystic lesion within the head of pancreas communicates with the main pancreatic duct with a maximum diameter 4.6 cm. Associated main duct dilatation measures 8 mm. Primary differential considerations include main duct IPMN versus sequelae of sequelae of chronic pancreatitis. According to consensus criteria, further evaluation with endoscopic ultrasound/FNA is recommended. This recommendation follows ACR consensus guidelines: Management of Incidental Pancreatic Cysts: A White Paper of the ACR Incidental Findings Committee. J Am Coll Radiol 5726;20:355-974. 2. Morphologic features of the liver compatible with cirrhosis. 3. Bilateral renal cysts. 4. Trace perihepatic  ascites. Electronically Signed   By: Kerby Moors M.D.   On: 12/09/2021 05:11   EEG adult  Result Date: 12/06/2021 Lora Havens, MD     12/06/2021 11:26 AM Patient Name: [name] MRN: [id number] Epilepsy Attending: Lora Havens Referring Physician/Provider: Dr Kerney Elbe Date: 12/06/2021 Duration: 20.59 mins Patient history: 81 year old female with seizure-like episodes, not on antiseizure medications.  MRI brain showed anterior falcine meningioma with mass-effect but no edema as well as blood glucose of 541. EEG to evaluate for seizure Level of alertness: Awake, asleep AEDs during EEG study: LEV Technical aspects: This EEG study was done with scalp electrodes positioned according to the 10-20 International system of electrode placement. Electrical activity was acquired at a sampling rate of $Remov'500Hz'bDjNwN$  and reviewed with a high frequency filter of $RemoveB'70Hz'cTnErNOW$  and a low frequency filter of $RemoveB'1Hz'oIhcicxf$ . EEG data were recorded continuously and digitally stored. Description: The posterior dominant rhythm consists of $RemoveBef'9Hz'NCUDgVxXwb$  activity of moderate voltage (25-35 uV) seen predominantly in posterior head regions, symmetric and reactive to eye opening and eye closing. Sleep was characterized by vertex waves, sleep spindles (12 to 14 Hz), maximal frontocentral region. Hyperventilation and photic stimulation were not performed.   IMPRESSION: This study is within normal limits. No seizures or epileptiform discharges were seen throughout the recording. Priyanka Barbra Sarks   Overnight EEG with video  Result Date: 12/07/2021 Lora Havens, MD     12/08/2021  7:28 AM Patient Name: [name] MRN: [id number] Epilepsy Attending: Lora Havens Referring Physician/Provider: Dr Kerney Elbe Duration: 12/06/2021 1128 to 12/07/2021 1128  Patient history: 81 year old female with seizure-like episodes, not on antiseizure medications.  MRI brain showed anterior falcine meningioma with mass-effect but no edema as well as blood glucose of  541. EEG to evaluate for seizure  Level of alertness: Awake, asleep  AEDs during EEG study: LEV  Technical aspects: This EEG study was done with scalp electrodes positioned according to the 10-20 International system of electrode placement. Electrical activity was acquired at a sampling rate of $Remov'500Hz'vaemfi$  and reviewed with a high frequency filter of $RemoveB'70Hz'nDXMkPaU$  and a low frequency filter of $RemoveB'1Hz'vAPCdGpX$ . EEG data were recorded continuously and digitally stored.  Description: The posterior dominant rhythm consists of $RemoveBef'9Hz'COWEKTFtYf$  activity of moderate voltage (25-35 uV) seen predominantly in posterior head regions, symmetric and reactive to eye opening and eye closing. Sleep was characterized by vertex waves, sleep spindles (12 to 14 Hz), maximal frontocentral region. Intermittent left temporal 6-$RemoveBeforeD'7hz'etIYPPYdmwFICh$  theta slowing was noted One seizure without clinical signs was noted on 12/06/2021 at 2237. EEG showed 6-$RemoveBefo'7hz'mktdgPKddkt$  theta slowing in left temporal region which then involved all of left hemisphere and right temporal region. EEG then showed 3-$RemoveBefor'4hz'ueLHdAkzFMuc$  delta slowing. Duration of seizure was about 2 minutes. Hyperventilation and photic stimulation were not performed.   ABNORMALITY - Seizure without clinical signs, left temporal region - Intermittent slow, left temporal region  IMPRESSION: This study showed one seizure without clinical signs on 12/06/2021 at 2237 arising from left temporal region, lasting about 3 minutes. There is also cortical dysfunction in left temporal region likely secondary to underlying structural abnormality.  Priyanka Barbra Sarks      Subjective: Patient seen and examined at bedside.  Poor historian.  No overnight fever, seizures, worsening abdominal pain noted.  Discharge Exam: Vitals:   12/09/21 0807 12/09/21 1211  BP: (!) 148/52 (!) 142/61  Pulse: 70 88  Resp: 18 20  Temp: 98.2 F (36.8 C) (!) 100.4 F (38 C)  SpO2: 96% 99%    General exam: No distress.  On room air currently.  Looks chronically ill and deconditioned.    Respiratory system: Decreased breath sounds at bases bilaterally with scattered crackles  cardiovascular system: Rate controlled; S1-S2 heard gastrointestinal system: Abdomen is distended; soft and nontender.  Bowel sounds are heard  extremities: No cyanosis, clubbing; trace lower extremity edema Central nervous system: Alert; still slow to respond.  Poor historian.  No focal neurological deficits.  Moves extremities Skin: No obvious ecchymosis/lesions  psychiatry: Flat affect    The results of significant diagnostics from this hospitalization (including imaging, microbiology, ancillary and laboratory) are listed below for reference.     Microbiology: Recent Results (from the past 240 hour(s))  Resp Panel by RT-PCR (Flu A&B, Covid) Nasopharyngeal Swab     Status: None   Collection Time: 12/05/21  9:48 PM   Specimen: Nasopharyngeal Swab; Nasopharyngeal(NP) swabs in vial transport medium  Result Value Ref Range Status   SARS Coronavirus 2 by RT PCR NEGATIVE NEGATIVE Final    Comment: (NOTE) SARS-CoV-2 target nucleic acids are NOT DETECTED.  The SARS-CoV-2 RNA is generally detectable in upper respiratory specimens during the acute phase of infection. The lowest concentration of SARS-CoV-2 viral copies this assay can detect is 138 copies/mL. A negative result does not preclude SARS-Cov-2 infection and should not be used as the sole basis for treatment or other patient management decisions. A negative result may occur with  improper specimen collection/handling, submission of specimen other than nasopharyngeal swab, presence of viral mutation(s) within the areas targeted by this assay, and inadequate number of viral copies(<138 copies/mL). A negative result must be combined with clinical observations, patient history, and epidemiological information. The expected result is Negative.  Fact Sheet for Patients:  EntrepreneurPulse.com.au  Fact Sheet for Healthcare  Providers:  IncredibleEmployment.be  This test is no t yet approved or cleared by the [place] FDA and  has been authorized for detection and/or diagnosis of SARS-CoV-2 by FDA under an Emergency Use Authorization (EUA). This EUA will remain  in effect (meaning this test can be used) for the duration of the COVID-19 declaration under Section 564(b)(1) of the Act, 21 U.S.C.section 360bbb-3(b)(1), unless the authorization is terminated  or revoked sooner.       Influenza A by PCR NEGATIVE NEGATIVE Final   Influenza B by PCR NEGATIVE NEGATIVE Final    Comment: (NOTE) The Xpert Xpress SARS-CoV-2/FLU/RSV plus assay is intended as an aid in the diagnosis of influenza from Nasopharyngeal swab specimens and should not be used as a sole basis for treatment. Nasal washings and aspirates are unacceptable for Xpert Xpress SARS-CoV-2/FLU/RSV testing.  Fact Sheet for Patients: EntrepreneurPulse.com.au  Fact Sheet for Healthcare Providers: IncredibleEmployment.be  This test is not yet approved or cleared by the [place] FDA and has been authorized for detection and/or diagnosis of SARS-CoV-2 by FDA under an Emergency Use Authorization (  EUA). This EUA will remain in effect (meaning this test can be used) for the duration of the COVID-19 declaration under Section 564(b)(1) of the Act, 21 U.S.C. section 360bbb-3(b)(1), unless the authorization is terminated or revoked.  Performed at [facility], [place] [address], [place], [place] [address]      Labs: BNP (last 3 results) No results for input(s): BNP in the last 8760 hours. Basic Metabolic Panel: Recent Labs  Lab 12/05/21 2134 12/05/21 2148 12/06/21 0405 12/07/21 0147 12/08/21 0220 12/09/21 0630  NA 131* 134* 136 138 137 137  K 3.8 3.9 3.2* 3.8 3.6 3.6  CL 101 99 106 108 107 108  CO2 21*  --  24 21* 22 21*  GLUCOSE 622* 642* 157* 245* 152* 213*  BUN 29* 30* 27*  28* 29* 23  CREATININE 1.95* 1.70* 1.63* 2.23* 1.91* 1.67*  CALCIUM 7.7*  --  7.7* 7.6* 7.6* 7.6*  MG  --   --  1.9  --   --  1.7   Liver Function Tests: Recent Labs  Lab 12/05/21 2134 12/06/21 0405 12/08/21 0220 12/09/21 0630  AST 47* $Remov'29 23 27  'JThPcs$ ALT $Remo'31 26 24 25  'Exwla$ ALKPHOS 149* 122 117 107  BILITOT 0.7 0.7 0.7 0.8  PROT 5.9* 5.0* 5.3* 5.0*  ALBUMIN 3.1* 2.7* 2.8* 2.6*   No results for input(s): LIPASE, AMYLASE in the last 168 hours. No results for input(s): AMMONIA in the last 168 hours. CBC: Recent Labs  Lab 12/05/21 2134 12/05/21 2148 12/06/21 0405 12/07/21 0147 12/08/21 0220  WBC 6.0  --  5.2 4.9 5.8  NEUTROABS 3.9  --   --   --   --   HGB 11.7* 11.9* 10.4* 10.3* 10.6*  HCT 34.4* 35.0* 30.3* 30.9* 30.7*  MCV 90.5  --  91.0 91.7 90.8  PLT 131*  --  111* 114* 113*   Cardiac Enzymes: No results for input(s): CKTOTAL, CKMB, CKMBINDEX, TROPONINI in the last 168 hours. BNP: Invalid input(s): POCBNP CBG: Recent Labs  Lab 12/08/21 1734 12/08/21 1853 12/08/21 2219 12/09/21 0620 12/09/21 1226  GLUCAP 89 85 156* 193* 239*   D-Dimer No results for input(s): DDIMER in the last 72 hours. Hgb A1c No results for input(s): HGBA1C in the last 72 hours. Lipid Profile No results for input(s): CHOL, HDL, LDLCALC, TRIG, CHOLHDL, LDLDIRECT in the last 72 hours. Thyroid function studies No results for input(s): TSH, T4TOTAL, T3FREE, THYROIDAB in the last 72 hours.  Invalid input(s): FREET3 Anemia work up No results for input(s): VITAMINB12, FOLATE, FERRITIN, TIBC, IRON, RETICCTPCT in the last 72 hours. Urinalysis    Component Value Date/Time   COLORURINE STRAW (A) 12/05/2021 2233   APPEARANCEUR CLEAR 12/05/2021 2233   LABSPEC 1.011 12/05/2021 2233   LABSPEC 1.015 08/04/2014 1439   PHURINE 5.0 12/05/2021 2233   GLUCOSEU >=500 (A) 12/05/2021 2233   GLUCOSEU Negative 08/04/2014 1439   HGBUR SMALL (A) 12/05/2021 2233   HGBUR negative 10/10/2010 1607   BILIRUBINUR  NEGATIVE 12/05/2021 2233   BILIRUBINUR Negative 08/04/2014 1439   KETONESUR NEGATIVE 12/05/2021 2233   PROTEINUR 30 (A) 12/05/2021 2233   UROBILINOGEN 0.2 08/04/2014 1439   NITRITE NEGATIVE 12/05/2021 2233   LEUKOCYTESUR NEGATIVE 12/05/2021 2233   LEUKOCYTESUR Negative 08/04/2014 1439   Sepsis Labs Invalid input(s): PROCALCITONIN,  WBC,  LACTICIDVEN Microbiology Recent Results (from the past 240 hour(s))  Resp Panel by RT-PCR (Flu A&B, Covid) Nasopharyngeal Swab     Status: None   Collection Time: 12/05/21  9:48 PM   Specimen: Nasopharyngeal Swab; Nasopharyngeal(NP) swabs in vial transport medium  Result Value Ref Range Status   SARS Coronavirus 2 by RT PCR NEGATIVE NEGATIVE Final    Comment: (NOTE) SARS-CoV-2 target nucleic acids are NOT DETECTED.  The SARS-CoV-2 RNA is generally detectable in upper respiratory specimens during the acute phase of infection. The lowest concentration of SARS-CoV-2 viral copies this assay can detect is 138 copies/mL. A negative result does not preclude SARS-Cov-2 infection and should not be used as the sole basis for treatment or other patient management decisions. A negative result may occur with  improper specimen collection/handling, submission of specimen other than nasopharyngeal swab, presence of viral mutation(s) within the areas targeted by this assay, and inadequate number of viral copies(<138 copies/mL). A negative result must be combined with clinical observations, patient history, and epidemiological information. The expected result is Negative.  Fact Sheet for Patients:  EntrepreneurPulse.com.au  Fact Sheet for Healthcare Providers:  IncredibleEmployment.be  This test is no t yet approved or cleared by the [place] FDA and  has been authorized for detection and/or diagnosis of SARS-CoV-2 by FDA under an Emergency Use Authorization (EUA). This EUA will remain  in effect (meaning this test  can be used) for the duration of the COVID-19 declaration under Section 564(b)(1) of the Act, 21 U.S.C.section 360bbb-3(b)(1), unless the authorization is terminated  or revoked sooner.       Influenza A by PCR NEGATIVE NEGATIVE Final   Influenza B by PCR NEGATIVE NEGATIVE Final    Comment: (NOTE) The Xpert Xpress SARS-CoV-2/FLU/RSV plus assay is intended as an aid in the diagnosis of influenza from Nasopharyngeal swab specimens and should not be used as a sole basis for treatment. Nasal washings and aspirates are unacceptable for Xpert Xpress SARS-CoV-2/FLU/RSV testing.  Fact Sheet for Patients: EntrepreneurPulse.com.au  Fact Sheet for Healthcare Providers: IncredibleEmployment.be  This test is not yet approved or cleared by the [place] FDA and has been authorized for detection and/or diagnosis of SARS-CoV-2 by FDA under an Emergency Use Authorization (EUA). This EUA will remain in effect (meaning this test can be used) for the duration of the COVID-19 declaration under Section 564(b)(1) of the Act, 21 U.S.C. section 360bbb-3(b)(1), unless the authorization is terminated or revoked.  Performed at [facility], [place] [address] [place], [place] [address]      Time coordinating discharge: 35 minutes  SIGNED:   Aline August, MD  [facility] 12/09/2021, 3:06 PM

## 2021-12-09 NOTE — Care Management Important Message (Signed)
Important Message  Patient Details  Name: [name] MRN: [id number] Date of Birth: 1941/03/09   Medicare Important Message Given:  Yes     Orbie Pyo 12/09/2021, 2:55 PM

## 2021-12-09 NOTE — Plan of Care (Signed)

## 2021-12-09 NOTE — Progress Notes (Signed)
TR[facility] night cross cover note:  I was notified by RN that the patient remains n.p.o. leading up to MRI abdomen and that, within these confines, is due for her evening dose of scheduled Keppra 750 mg p.o. twice daily.  Consequently, I changed her oral Keppra to IV form, with next dose to occur now.  Additionally, the patient complaining of some nausea.  After reviewing EKG for QTC, I have placed order for as needed Zofran.  Update: per radiology, order for MRI abdomen needs to be changed to with and without contrast.  I subsequently made this requested change to the existing imaging order.     Babs Bertin, DO Hospitalist

## 2021-12-09 NOTE — Progress Notes (Signed)
Patient ID: [name], female   DOB: 1941/08/13, 81 y.o.   MRN: [id number]  PROGRESS NOTE    [name]  IRS:[id number] DOB: 1941/09/01 DOA: 12/05/2021 PCP: Isaac Bliss, Rayford Halsted, MD   Brief Narrative:  81 year old female with history of leukemia in remission, diastolic CHF, breast cancer, hypertension, hyperlipidemia, chronic kidney disease stage IV, DM 2 presented  with episodes of seizure-like activity along with slurred speech.  Neurology was consulted.  She was found to have significant hyperglycemia on presentation and was started on insulin drip.  Assessment & Plan:   New onset seizures -LTM EEG from 12/07/2021-12/08/2021 did not show seizures.  Currently on Keppra as per neurology.  Neurology signed off on 12/08/2021.  Outpatient follow-up with neurology.  Neurology has cleared the patient for discharge. -For precaution/seizure precautions. -PT recommended no PT follow-up.  Diabetes mellitus type 2 with hyperglycemia/HHS -Initially started on insulin drip on presentation for hyperglycemia.  Subsequently has been switched to subcutaneous insulin.  Continue long-acting insulin along with CBGs with SSI.  A1c 11.3.  Meningioma -MRI of the brain showed a 17 mm anterior falcine meningioma with local cortical mass-effect but no brain edema.  This is increased since prior MRI few years ago.  Neurosurgery consulted, no surgical indications currently.  Outpatient follow-up with neurosurgery  Acute kidney injury on chronic kidney disease stage IV -Baseline creatinine around 1.5.  Creatinine was 1.95 on admission, likely in the setting of dehydration due to HHS. -Creatinine was improving but worsened to 2.23 on 12/07/2021 for which she was started on IV fluids again.  Creatinine 1.67 today.  Currently on gentle hydration.  Repeat a.m. 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 -CT of the abdomen and pelvis showed possible pancreatic head lesion along with cirrhotic liver.  MRI of abdomen showed cystic pancreatic head mass with cirrhosis of liver.  Follow further GI recommendations. -LFTs normal  Thrombocytopenia -Questionable cause.  Currently stable  Hypokalemia -Resolved  Anemia of chronic disease -Hemoglobin stable.  Hypertension -Blood pressure stable.  Continue amlodipine, Imdur, metoprolol succinate and torsemide  Chronic diastolic CHF -Currently euvolemic.  Outpatient follow-up with cardiology.  Continue Imdur and metoprolol succinate.  Strict input and output Daily weights. -Torsemide on hold for now.  Hyperlipidemia -Continue statin  History of TIA -Continue aspirin, Plavix, statin  History of AML in remission and breast cancer -Outpatient follow-up with oncology    DVT prophylaxis: Lovenox Code Status: Full Family Communication: None at bedside Disposition Plan: Status is: Inpatient  Remains inpatient appropriate because: Of severity of illness.  Need for IV fluids. Consultants: Neurology/GI Procedures: LTM EEG  Antimicrobials: None   Subjective: Patient seen and examined at bedside.  Poor historian.  No overnight fever, seizures, worsening abdominal pain noted.  Objective: Vitals:   12/09/21 0300 12/09/21 0500 12/09/21 0807 12/09/21 1211  BP: (!) 139/55  (!) 148/52 (!) 142/61  Pulse: 73  70 88  Resp: 16  18 20   Temp: 98.2 F (36.8 C)  98.2 F (36.8 C) (!) 100.4 F (38 C)  TempSrc: Oral  Oral Oral  SpO2: 100%  96% 99%  Weight:  73.3 kg    Height:        Intake/Output Summary (Last 24 hours) at 12/09/2021 1353 Last data filed at 12/09/2021 1200 Gross per 24 hour  Intake 240 ml  Output --  Net 240 ml    [employer]  12/07/21 0500 12/08/21 0500 12/09/21 0500  Weight: 79 kg 74.6 kg 73.3 kg    Examination:  General exam: No distress.  On room air currently.  Looks chronically ill and  deconditioned.   Respiratory system: Decreased breath sounds at bases bilaterally with scattered crackles  cardiovascular system: Rate controlled; S1-S2 heard gastrointestinal system: Abdomen is distended; soft and nontender.  Bowel sounds are heard  extremities: No cyanosis, clubbing; trace lower extremity edema Central nervous system: Alert; still slow to respond.  Poor historian.  No focal neurological deficits.  Moves extremities Skin: No obvious ecchymosis/lesions  psychiatry: Flat affect  Data Reviewed: I have personally reviewed following labs and imaging studies  CBC: Recent Labs  Lab 12/05/21 2134 12/05/21 2148 12/06/21 0405 12/07/21 0147 12/08/21 0220  WBC 6.0  --  5.2 4.9 5.8  NEUTROABS 3.9  --   --   --   --   HGB 11.7* 11.9* 10.4* 10.3* 10.6*  HCT 34.4* 35.0* 30.3* 30.9* 30.7*  MCV 90.5  --  91.0 91.7 90.8  PLT 131*  --  111* 114* 113*    Basic Metabolic Panel: Recent Labs  Lab 12/05/21 2134 12/05/21 2148 12/06/21 0405 12/07/21 0147 12/08/21 0220 12/09/21 0630  NA 131* 134* 136 138 137 137  K 3.8 3.9 3.2* 3.8 3.6 3.6  CL 101 99 106 108 107 108  CO2 21*  --  24 21* 22 21*  GLUCOSE 622* 642* 157* 245* 152* 213*  BUN 29* 30* 27* 28* 29* 23  CREATININE 1.95* 1.70* 1.63* 2.23* 1.91* 1.67*  CALCIUM 7.7*  --  7.7* 7.6* 7.6* 7.6*  MG  --   --  1.9  --   --  1.7    GFR: Estimated Creatinine Clearance: 25.6 mL/min (A) (by C-G formula based on SCr of 1.67 mg/dL (H)). Liver Function Tests: Recent Labs  Lab 12/05/21 2134 12/06/21 0405 12/08/21 0220 12/09/21 0630  AST 47* 29 23 27   ALT 31 26 24 25   ALKPHOS 149* 122 117 107  BILITOT 0.7 0.7 0.7 0.8  PROT 5.9* 5.0* 5.3* 5.0*  ALBUMIN 3.1* 2.7* 2.8* 2.6*    No results for input(s): LIPASE, AMYLASE in the last 168 hours. No results for input(s): AMMONIA in the last 168 hours. Coagulation Profile: Recent Labs  Lab 12/05/21 2134 12/08/21 1251  INR 1.1 1.1    Cardiac Enzymes: No results for input(s):  CKTOTAL, CKMB, CKMBINDEX, TROPONINI in the last 168 hours. BNP (last 3 results) No results for input(s): PROBNP in the last 8760 hours. HbA1C: Recent Labs    12/06/21 1440  HGBA1C 11.3*    CBG: Recent Labs  Lab 12/08/21 1734 12/08/21 1853 12/08/21 2219 12/09/21 0620 12/09/21 1226  GLUCAP 89 85 156* 193* 239*    Lipid Profile: No results for input(s): CHOL, HDL, LDLCALC, TRIG, CHOLHDL, LDLDIRECT in the last 72 hours. Thyroid Function Tests: No results for input(s): TSH, T4TOTAL, FREET4, T3FREE, THYROIDAB in the last 72 hours. Anemia Panel: No results for input(s): VITAMINB12, FOLATE, FERRITIN, TIBC, IRON, RETICCTPCT in the last 72 hours. Sepsis Labs: No results for input(s): PROCALCITON, LATICACIDVEN in the last 168 hours.  Recent Results (from the past 240 hour(s))  Resp Panel by RT-PCR (Flu A&B, Covid) Nasopharyngeal Swab     Status: None   Collection Time: 12/05/21  9:48 PM   Specimen: Nasopharyngeal Swab; Nasopharyngeal(NP) swabs in vial transport medium  Result Value Ref Range Status   SARS Coronavirus 2 by RT PCR NEGATIVE NEGATIVE Final  Comment: (NOTE) SARS-CoV-2 target nucleic acids are NOT DETECTED.  The SARS-CoV-2 RNA is generally detectable in upper respiratory specimens during the acute phase of infection. The lowest concentration of SARS-CoV-2 viral copies this assay can detect is 138 copies/mL. A negative result does not preclude SARS-Cov-2 infection and should not be used as the sole basis for treatment or other patient management decisions. A negative result may occur with  improper specimen collection/handling, submission of specimen other than nasopharyngeal swab, presence of viral mutation(s) within the areas targeted by this assay, and inadequate number of viral copies(<138 copies/mL). A negative result must be combined with clinical observations, patient history, and epidemiological information. The expected result is Negative.  Fact Sheet for  Patients:  EntrepreneurPulse.com.au  Fact Sheet for Healthcare Providers:  IncredibleEmployment.be  This test is no t yet approved or cleared by the [place] FDA and  has been authorized for detection and/or diagnosis of SARS-CoV-2 by FDA under an Emergency Use Authorization (EUA). This EUA will remain  in effect (meaning this test can be used) for the duration of the COVID-19 declaration under Section 564(b)(1) of the Act, 21 U.S.C.section 360bbb-3(b)(1), unless the authorization is terminated  or revoked sooner.       Influenza A by PCR NEGATIVE NEGATIVE Final   Influenza B by PCR NEGATIVE NEGATIVE Final    Comment: (NOTE) The Xpert Xpress SARS-CoV-2/FLU/RSV plus assay is intended as an aid in the diagnosis of influenza from Nasopharyngeal swab specimens and should not be used as a sole basis for treatment. Nasal washings and aspirates are unacceptable for Xpert Xpress SARS-CoV-2/FLU/RSV testing.  Fact Sheet for Patients: EntrepreneurPulse.com.au  Fact Sheet for Healthcare Providers: IncredibleEmployment.be  This test is not yet approved or cleared by the [place] FDA and has been authorized for detection and/or diagnosis of SARS-CoV-2 by FDA under an Emergency Use Authorization (EUA). This EUA will remain in effect (meaning this test can be used) for the duration of the COVID-19 declaration under Section 564(b)(1) of the Act, 21 U.S.C. section 360bbb-3(b)(1), unless the authorization is terminated or revoked.  Performed at [facility], [place] [address] [place], [place] [address]           Radiology Studies: CT ABDOMEN PELVIS WO CONTRAST  Result Date: 12/07/2021 CLINICAL DATA:  Acute abdominal pain. EXAM: CT ABDOMEN AND PELVIS WITHOUT CONTRAST TECHNIQUE: Multidetector CT imaging of the abdomen and pelvis was performed following the standard protocol without IV contrast.  COMPARISON:  CT abdomen and pelvis 02/23/2014. FINDINGS: Lower chest: Small nodular densities in the lung bases are unchanged from the prior examination 2015 and favored as benign. There is a trace right pleural effusion. Hepatobiliary: There is nodular liver contour. There is an indeterminate hypodense lesion in the inferior right lobe of the liver measuring 16 mm image 3/34. Small gallstones are present. There is no biliary ductal dilatation. Pancreas: There is a 4.0 x 3.8 x 4.4 cm complex low-density lesion in the head of the pancreas with coarse peripheral calcifications. There is marked diffuse atrophy of the pancreatic body and tail with pancreatic ductal dilatation measuring 8 mm. These findings are new from 2015. these findings were likely present on 05/08/2020. Spleen: Normal in size without focal abnormality. Adrenals/Urinary Tract: There is a punctate nonobstructing left renal calculus measuring 3 mm. There is no hydronephrosis. There is a subcentimeter hyperdense lesion in the superior pole the left kidney image 6/73 which is indeterminate. There is likely a small cyst in the lateral left kidney.  Adrenal glands and bladder are within normal limits. Stomach/Bowel: Stomach is within normal limits. Appendix appears normal. No evidence of bowel wall thickening, distention, or inflammatory changes. Vascular/Lymphatic: Aortic atherosclerosis. No enlarged abdominal or pelvic lymph nodes. Reproductive: Status post hysterectomy. No adnexal masses. Other: There is a small fat containing umbilical hernia. There is no ascites or free air. There is mild body wall edema. Musculoskeletal: Diffuse mixed lytic and sclerotic osseous metastatic lesions present. There are no acute fractures identified. IMPRESSION: 1. Pancreatic head lesion appears cystic and complex (measuring up to 4.4 cm), indeterminate. Recommend further evaluation with pancreatic MRI. There is diffuse pancreatic body and tail atrophy and ductal  dilatation. 2. Cirrhotic liver. Indeterminate hypodense lesion in the inferior right lobe of the liver. Recommend further evaluation with MRI. 3. Cholelithiasis. 4. Nonobstructing left renal calculus. 5. Mild body wall edema and trace right pleural effusion. 6.  Aortic Atherosclerosis (ICD10-I70.0). 7. Diffuse osseous metastatic disease. Electronically Signed   By: Ronney Asters M.D.   On: 12/07/2021 20:58   MR ABDOMEN W WO CONTRAST  Result Date: 12/09/2021 CLINICAL DATA:  Acute pain. EXAM: MRI ABDOMEN WITHOUT AND WITH CONTRAST TECHNIQUE: Multiplanar multisequence MR imaging of the abdomen was performed both before and after the administration of intravenous contrast. CONTRAST:  7.62mL GADAVIST GADOBUTROL 1 MMOL/ML IV SOLN COMPARISON:  CT AP 12/07/2021 and CT AP 02/23/2014 FINDINGS: Lower chest: Small pleural effusions noted. Hepatobiliary: The liver has a cirrhotic appearance with diffuse irregular nodular contour. Hypertrophy of the caudate lobe is also noted. No suspicious enhancing liver lesions. Multiple stones are identified layering within the dependent portion of the gallbladder measuring up to 5 mm. No gallbladder wall thickening or pericholecystic inflammation. Common bile duct appears nondilated. Pancreas: Increased caliber of the main duct at the level of the pancreatic neck measures 8 mm, image 19/7. This communicates with a cystic lesion within the head of pancreas measuring 4.6 x 3.4 by 4.2 cm., new from 02/23/2014. This has multiple, discontinuous mural calcifications as seen on the CT from 12/07/2021. Just before the ampulla the distal duct measures 8 mm and there is a small calcification identified measuring 4 mm. No internal areas of enhancing septation or mural nodule identified at this time. There is atrophy of the body and tail of pancreas. Spleen:  Within normal limits in size and appearance. Adrenals/Urinary Tract: Normal appearance of the adrenal glands. Bilateral renal cortical thinning  with multiple small cysts are identified. Cysts measure up to 1.4 cm. No hydronephrosis. Stomach/Bowel: No dilated bowel loops identified. Vascular/Lymphatic: Aortic atherosclerosis. No aneurysm. Upper abdominal vasculature is patent. Increase caliber of the extrahepatic portal vein measures 1.7 cm. No adenopathy. Other:  Trace perihepatic ascites.  No focal fluid collections. Musculoskeletal: No suspicious bone lesions identified. IMPRESSION: 1. Large, cystic lesion within the head of pancreas communicates with the main pancreatic duct with a maximum diameter 4.6 cm. Associated main duct dilatation measures 8 mm. Primary differential considerations include main duct IPMN versus sequelae of sequelae of chronic pancreatitis. According to consensus criteria, further evaluation with endoscopic ultrasound/FNA is recommended. This recommendation follows ACR consensus guidelines: Management of Incidental Pancreatic Cysts: A White Paper of the ACR Incidental Findings Committee. J Am Coll Radiol 1638;46:659-935. 2. Morphologic features of the liver compatible with cirrhosis. 3. Bilateral renal cysts. 4. Trace perihepatic ascites. Electronically Signed   By: Kerby Moors M.D.   On: 12/09/2021 05:11        Scheduled Meds:  amLODipine  10 mg Oral Daily  aspirin EC  81 mg Oral Daily   atorvastatin  40 mg Oral Daily   Chlorhexidine Gluconate Cloth  6 each Topical Daily   enoxaparin (LOVENOX) injection  30 mg Subcutaneous Q24H   fluticasone furoate-vilanterol  1 puff Inhalation Daily   insulin aspart  0-15 Units Subcutaneous TID WC   insulin aspart  0-5 Units Subcutaneous QHS   insulin glargine-yfgn  20 Units Subcutaneous Daily   isosorbide mononitrate  45 mg Oral Daily   letrozole  2.5 mg Oral Daily   metoprolol succinate  100 mg Oral Daily   sodium chloride flush  10-40 mL Intracatheter Q12H   sodium chloride flush  3 mL Intravenous Once   sodium chloride flush  3 mL Intravenous Q12H   Continuous  Infusions:  sodium chloride 50 mL/hr at 12/08/21 1154   levETIRAcetam 750 mg (12/09/21 1100)          Aline August, MD [facility] 12/09/2021, 1:53 PM

## 2021-12-09 NOTE — Progress Notes (Addendum)
Patient ID: [name], female   DOB: [birth year], 81 y.o.   MRN: [id number]    Progress Note   Subjective  Day # 3 CC: abnormal CT imaging of pancreas and liver  CEA/CA 19-9 pending INR 1.1 Hepatitis panel negative/ANA pending Pancreatic fecal elastase pending Glucose 213 much improved LFTs within normal limits  MRI abdomen-cirrhotic appearing liver with diffuse irregular nodular contour, multiple layering stones in the gallbladder no gallbladder wall thickening, CBD nondilated Main pancreatic duct dilated 8 8 mm and communicating with a cystic lesion in the head of the pancreas measuring 4.6 x 3.4 x 4.2 cm new from 2015-multiple discontinuous mural calcifications, atrophy of pancreatic body and tail Rule out main duct IPMN in versus sequelae of chronic pancreatitis-EUS with FNA recommended.  Patient not feeling well this afternoon, has new fever in the 100 range and chills No current complaints of abdominal pain, no nausea vomiting, no cough or shortness of breath    Objective   Vital signs in last 24 hours: Temp:  [98.2 F (36.8 C)-98.5 F (36.9 C)] 98.2 F (36.8 C) (01/02 0807) Pulse Rate:  [65-73] 70 (01/02 0807) Resp:  [16-20] 18 (01/02 0807) BP: (139-148)/(52-59) 148/52 (01/02 0807) SpO2:  [96 %-100 %] 96 % (01/02 0807) Weight:  [73.3 kg] 73.3 kg (01/02 0500) Last BM Date: 12/07/21 General:    Elderly white female female in NAD, chronically ill-appearing Heart:  Regular rate and rhythm; no murmurs Lungs: Respirations even and unlabored, lungs CTA bilaterally Abdomen:  Soft, mild tenderness in the mid abdomen and nondistended. Normal bowel sounds. Extremities:  Without edema. Neurologic:  Alert and oriented,  grossly normal neurologically. Psych:  Cooperative. Normal mood and affect.  Intake/Output from previous day: 01/01 0701 - 01/02 0700 In: 237 [P.O.:237] Out: -  Intake/Output this shift: No intake/output data recorded.  Lab Results: Recent Labs     12/07/21 0147 12/08/21 0220  WBC 4.9 5.8  HGB 10.3* 10.6*  HCT 30.9* 30.7*  PLT 114* 113*   BMET Recent Labs    12/07/21 0147 12/08/21 0220 12/09/21 0630  NA 138 137 137  K 3.8 3.6 3.6  CL 108 107 108  CO2 21* 22 21*  GLUCOSE 245* 152* 213*  BUN 28* 29* 23  CREATININE 2.23* 1.91* 1.67*  CALCIUM 7.6* 7.6* 7.6*   LFT Recent Labs    12/09/21 0630  PROT 5.0*  ALBUMIN 2.6*  AST 27  ALT 25  ALKPHOS 107  BILITOT 0.8   PT/INR Recent Labs    12/08/21 1251  LABPROT 13.9  INR 1.1    Studies/Results: CT ABDOMEN PELVIS WO CONTRAST  Result Date: 12/07/2021 CLINICAL DATA:  Acute abdominal pain. EXAM: CT ABDOMEN AND PELVIS WITHOUT CONTRAST TECHNIQUE: Multidetector CT imaging of the abdomen and pelvis was performed following the standard protocol without IV contrast. COMPARISON:  CT abdomen and pelvis 02/23/2014. FINDINGS: Lower chest: Small nodular densities in the lung bases are unchanged from the prior examination 2015 and favored as benign. There is a trace right pleural effusion. Hepatobiliary: There is nodular liver contour. There is an indeterminate hypodense lesion in the inferior right lobe of the liver measuring 16 mm image 3/34. Small gallstones are present. There is no biliary ductal dilatation. Pancreas: There is a 4.0 x 3.8 x 4.4 cm complex low-density lesion in the head of the pancreas with coarse peripheral calcifications. There is marked diffuse atrophy of the pancreatic body and tail with pancreatic ductal dilatation measuring 8 mm. These findings are new  from 2015. these findings were likely present on 05/08/2020. Spleen: Normal in size without focal abnormality. Adrenals/Urinary Tract: There is a punctate nonobstructing left renal calculus measuring 3 mm. There is no hydronephrosis. There is a subcentimeter hyperdense lesion in the superior pole the left kidney image 6/73 which is indeterminate. There is likely a small cyst in the lateral left kidney. Adrenal glands  and bladder are within normal limits. Stomach/Bowel: Stomach is within normal limits. Appendix appears normal. No evidence of bowel wall thickening, distention, or inflammatory changes. Vascular/Lymphatic: Aortic atherosclerosis. No enlarged abdominal or pelvic lymph nodes. Reproductive: Status post hysterectomy. No adnexal masses. Other: There is a small fat containing umbilical hernia. There is no ascites or free air. There is mild body wall edema. Musculoskeletal: Diffuse mixed lytic and sclerotic osseous metastatic lesions present. There are no acute fractures identified. IMPRESSION: 1. Pancreatic head lesion appears cystic and complex (measuring up to 4.4 cm), indeterminate. Recommend further evaluation with pancreatic MRI. There is diffuse pancreatic body and tail atrophy and ductal dilatation. 2. Cirrhotic liver. Indeterminate hypodense lesion in the inferior right lobe of the liver. Recommend further evaluation with MRI. 3. Cholelithiasis. 4. Nonobstructing left renal calculus. 5. Mild body wall edema and trace right pleural effusion. 6.  Aortic Atherosclerosis (ICD10-I70.0). 7. Diffuse osseous metastatic disease. Electronically Signed   By: Ronney Asters M.D.   On: 12/07/2021 20:58   MR ABDOMEN W WO CONTRAST  Result Date: 12/09/2021 CLINICAL DATA:  Acute pain. EXAM: MRI ABDOMEN WITHOUT AND WITH CONTRAST TECHNIQUE: Multiplanar multisequence MR imaging of the abdomen was performed both before and after the administration of intravenous contrast. CONTRAST:  7.34mL GADAVIST GADOBUTROL 1 MMOL/ML IV SOLN COMPARISON:  CT AP 12/07/2021 and CT AP 02/23/2014 FINDINGS: Lower chest: Small pleural effusions noted. Hepatobiliary: The liver has a cirrhotic appearance with diffuse irregular nodular contour. Hypertrophy of the caudate lobe is also noted. No suspicious enhancing liver lesions. Multiple stones are identified layering within the dependent portion of the gallbladder measuring up to 5 mm. No gallbladder wall  thickening or pericholecystic inflammation. Common bile duct appears nondilated. Pancreas: Increased caliber of the main duct at the level of the pancreatic neck measures 8 mm, image 19/7. This communicates with a cystic lesion within the head of pancreas measuring 4.6 x 3.4 by 4.2 cm., new from 02/23/2014. This has multiple, discontinuous mural calcifications as seen on the CT from 12/07/2021. Just before the ampulla the distal duct measures 8 mm and there is a small calcification identified measuring 4 mm. No internal areas of enhancing septation or mural nodule identified at this time. There is atrophy of the body and tail of pancreas. Spleen:  Within normal limits in size and appearance. Adrenals/Urinary Tract: Normal appearance of the adrenal glands. Bilateral renal cortical thinning with multiple small cysts are identified. Cysts measure up to 1.4 cm. No hydronephrosis. Stomach/Bowel: No dilated bowel loops identified. Vascular/Lymphatic: Aortic atherosclerosis. No aneurysm. Upper abdominal vasculature is patent. Increase caliber of the extrahepatic portal vein measures 1.7 cm. No adenopathy. Other:  Trace perihepatic ascites.  No focal fluid collections. Musculoskeletal: No suspicious bone lesions identified. IMPRESSION: 1. Large, cystic lesion within the head of pancreas communicates with the main pancreatic duct with a maximum diameter 4.6 cm. Associated main duct dilatation measures 8 mm. Primary differential considerations include main duct IPMN versus sequelae of sequelae of chronic pancreatitis. According to consensus criteria, further evaluation with endoscopic ultrasound/FNA is recommended. This recommendation follows ACR consensus guidelines: Management of Incidental Pancreatic Cysts: A  White Paper of the ACR Incidental Findings Committee. J Am Coll Radiol 0962;83:662-947. 2. Morphologic features of the liver compatible with cirrhosis. 3. Bilateral renal cysts. 4. Trace perihepatic ascites.  Electronically Signed   By: Kerby Moors M.D.   On: 12/09/2021 05:11       Assessment / Plan:   #29  81 year old female admitted with concern for seizure activity and found to have severe hyperglycemia-EEG negative  #2 adult onset diabetes mellitus-insulin-dependent #3 intermittent abdominal pain over 1 year, intermittent loose/oily stool  CT on admission shows a 4.0 x 3.8 x 4.0 cm complex low-density lesion in the pancreatic head and diffuse atrophy pancreatic tail and body Cirrhotic appearing liver and indeterminant right hepatic lobe lesion 1.6 cm, small gallstones  No history of EtOH, hepatitis serologies negative ANA pending, tumor markers pending. Suspect cirrhotic liver secondary to nonalcoholic fatty liver disease  MRI-cystic lesion in the head of the pancreas communicating with the main pancreatic duct, maximal diameter 4.6 cm and associated main duct dilation to 8 mm probable main duct IPMN versus sequelae of chronic pancreatitis Cirrhotic appearing liver No liver lesion mentioned on MRI Gallstones  Fecal elastase pending  #4 coronary artery disease status post stent #5  Chronic antiplatelet therapy-on Plavix and aspirin-on hold since admit #6 history of acute myeloid leukemia in remission #7 history of breast cancer #8 Congestive heart failure #9 chronic kidney disease stage IV #10 History of adenomatous polyp #11 new fever and malaise-defer to primary team  Plan: If fecal elastase low, consistent with pancreatic insufficiency, start Creon 10 2 p.o. with each meal Patient will need EUS and FNA of the pancreatic head lesion and This will be done as an outpatient.  We will communicate with our biliary specialists and this will be arranged as an outpatient GI signing off. Outpatient GI follow up with Dr. Henrene Pastor. EUS to be arranged as outpatient.    LOS: 3 days   Amy Esterwood PA-C 12/09/2021, 12:07 PM     Attending Physician Note   I have taken an interval history,  reviewed the chart and examined the patient. I personally saw the patient and performed more than 50% of this encounter in conjunction with the APP. I agree with the APP's note, impression and recommendations. My additional impressions and recommendations are as follows.   *Cystic pancreatic head mass, suspected main duct IPMN vs sequelae of pancreatitis. Further evaluation with elective EUS as outpatient.  *Cirrhosis, suspected NASH. HBsAg and HCV negative. ANA pending. Further follow up and evaluation as outpatient.  *R/O pancreatic insufficiency. Fecal elastase pending.  GI signing off. Outpatient GI follow up with Dr. Scarlette Shorts. EUS to be arranged as outpatient and we will contact her.    Lucio Edward, MD [facility] See [facility], [facility], for our on call provider

## 2021-12-09 NOTE — TOC Transition Note (Signed)
Transition of Care [facility]) - CM/SW Discharge Note   Patient Details  Name: Catherine PANGILINAN MRN: [id number] Date of Birth: 10-Apr-1941  [facility] CM/SW Contact:  Pollie Friar, RN Phone Number: 12/09/2021, 3:19 PM   Clinical Narrative:    Pt is discharging home with self care. No f/u per PT and no new DME needs.  Pt has transportation home.    Final next level of care: Home/Self Care Barriers to Discharge: No Barriers Identified   Patient Goals and CMS Choice        Discharge Placement                       Discharge Plan and Services                                     Social Determinants of Health (SDOH) Interventions     Readmission Risk Interventions No flowsheet data found.

## 2021-12-10 ENCOUNTER — Other Ambulatory Visit: Payer: Self-pay

## 2021-12-10 ENCOUNTER — Telehealth: Payer: Self-pay

## 2021-12-10 DIAGNOSIS — K869 Disease of pancreas, unspecified: Secondary | ICD-10-CM

## 2021-12-10 DIAGNOSIS — K861 Other chronic pancreatitis: Secondary | ICD-10-CM

## 2021-12-10 LAB — CEA: CEA: 9.5 ng/mL — ABNORMAL HIGH (ref 0.0–4.7)

## 2021-12-10 LAB — CANCER ANTIGEN 19-9: CA 19-9: 32 U/mL (ref 0–35)

## 2021-12-10 LAB — AFP TUMOR MARKER: AFP, Serum, Tumor Marker: 4 ng/mL (ref 0.0–9.2)

## 2021-12-10 LAB — ANA W/REFLEX IF POSITIVE: Anti Nuclear Antibody (ANA): NEGATIVE

## 2021-12-10 NOTE — Telephone Encounter (Signed)
Transition Care Management Follow-up Telephone Call Date of discharge and from where: [facility] 12-09-21 Dx New onset of seizures  How have you been since you were released from the hospital? Feeling better  Any questions or concerns? Yes  Items Reviewed: Did the pt receive and understand the discharge instructions provided? Yes  Medications obtained and verified? Yes  Other? No  Any new allergies since your discharge? No  Dietary orders reviewed? Yes Do you have support at home? Yes   Home Care and Equipment/Supplies: Were home health services ordered? no   Has the agency set up a time to come to the patient's home? not applicable Were any new equipment or medical supplies ordered?  No What is the name of the medical supply agency? na Were you able to get the supplies/equipment? not applicable Do you have any questions related to the use of the equipment or supplies? No  Functional Questionnaire: (I = Independent and D = Dependent) ADLs: I  Bathing/Dressing- I  Meal Prep- I  Eating- I  Maintaining continence- I  Transferring/Ambulation- I  Managing Meds- I  Follow up appointments reviewed:  PCP Hospital f/u appt confirmed? Yes  Scheduled to see Dr Deniece Ree  on 12-16-21 @ 230pm. [facility] f/u appt confirmed? no Are transportation arrangements needed? no If their condition worsens, is the pt aware to call PCP or go to the Emergency Dept.? Yes Was the patient provided with contact information for the PCP's office or ED? Yes Was to pt encouraged to call back with questions or concerns? Yes

## 2021-12-10 NOTE — Telephone Encounter (Signed)
EUS scheduled on 01/30/22 730 am at [facility] with DJ

## 2021-12-10 NOTE — Telephone Encounter (Signed)
-----   Message from Milus Banister, MD sent at 12/10/2021  8:03 AM EST ----- Regarding: RE: EUS and FNA Got it, thanks  Starsky Nanna, She needs first available upper EUS with myself or Gabe for abnormal pancreas.  Wynetta Fines  Thanks  DJ  ----- Message ----- From: Alfredia Ferguson, PA-C Sent: 12/09/2021   1:41 PM EST To: Milus Banister, MD, # Subject: EUS and FNA                                    This patient is currently hospitalized at [facility], probably will be discharged in the next couple of days Presented with symptoms secondary to marked hyperglycemia, probably has underlying pancreatic insufficiency with stearrhea  CT then MRI done-cystic lesion in the head of the pancreas communicating with the main pancreatic duct which is dilated lesion 4.6 cm maximal diameter-Per radiology differential is IPMN versus sequelae secondary to chronic pancreatitis (patient has no history of chronic pancreatitis)  She will need EUS and FNA. If one of you could review her case and then get her scheduled for follow-up/EUS it would be much appreciated thank you  Patient has been told that we will contact her later this week and that procedures will be done as an outpatient after she is discharged

## 2021-12-10 NOTE — Telephone Encounter (Signed)
Spoke with pt about upcoming EUS with DJ at [facility] on 01/30/22 at 7:30 am. Instructions have been sent to home address, as well as reviewed with pt on the phone. Pt's PCP has been sent a letter regarding plavix clearance.

## 2021-12-15 LAB — PANCREATIC ELASTASE, FECAL: Pancreatic Elastase-1, Stool: <50 ug Elast./g — ABNORMAL LOW (ref >200)

## 2021-12-16 ENCOUNTER — Ambulatory Visit (INDEPENDENT_AMBULATORY_CARE_PROVIDER_SITE_OTHER): Payer: Medicare HMO | Admitting: Internal Medicine

## 2021-12-16 VITALS — BP 140/60 | HR 65 | Temp 97.7°F | Wt 156.0 lb

## 2021-12-16 DIAGNOSIS — D329 Benign neoplasm of meninges, unspecified: Secondary | ICD-10-CM

## 2021-12-16 DIAGNOSIS — N184 Chronic kidney disease, stage 4 (severe): Secondary | ICD-10-CM

## 2021-12-16 DIAGNOSIS — F419 Anxiety disorder, unspecified: Secondary | ICD-10-CM

## 2021-12-16 DIAGNOSIS — E538 Deficiency of other specified B group vitamins: Secondary | ICD-10-CM | POA: Diagnosis not present

## 2021-12-16 DIAGNOSIS — E1122 Type 2 diabetes mellitus with diabetic chronic kidney disease: Secondary | ICD-10-CM | POA: Diagnosis not present

## 2021-12-16 DIAGNOSIS — Z09 Encounter for follow-up examination after completed treatment for conditions other than malignant neoplasm: Secondary | ICD-10-CM

## 2021-12-16 DIAGNOSIS — K869 Disease of pancreas, unspecified: Secondary | ICD-10-CM | POA: Diagnosis not present

## 2021-12-16 DIAGNOSIS — R569 Unspecified convulsions: Secondary | ICD-10-CM

## 2021-12-16 DIAGNOSIS — G2581 Restless legs syndrome: Secondary | ICD-10-CM

## 2021-12-16 DIAGNOSIS — Z794 Long term (current) use of insulin: Secondary | ICD-10-CM | POA: Diagnosis not present

## 2021-12-16 DIAGNOSIS — N179 Acute kidney failure, unspecified: Secondary | ICD-10-CM | POA: Diagnosis not present

## 2021-12-16 DIAGNOSIS — N1832 Chronic kidney disease, stage 3b: Secondary | ICD-10-CM | POA: Diagnosis not present

## 2021-12-16 MED ORDER — LORAZEPAM 0.5 MG PO TABS: ORAL_TABLET | ORAL | 2 refills | Status: DC

## 2021-12-16 NOTE — Progress Notes (Signed)
Hospital follow-up visit     CC/Reason for Visit: Hospitalization follow-up  HPI: [name] is a 81 y.o. female who is coming in today for the above mentioned reasons, specifically transitional care services face-to-face visit.    Dates hospitalized: 12/05/2021-12/09/2021 Days since discharge from hospital: 7 Patient was discharged from the hospital to: Home Reason for admission to hospital: New onset seizures Date of interactive phone contact with patient and/or caregiver: 12/10/2021  I have reviewed in detail patient's discharge summary plus pertinent specific notes, labs, and images from the hospitalization.  Yes  Patient was admitted to the hospital on December 29 after exhibiting seizure-like activity.  She was seen by neurology and placed on Keppra 750 mg twice daily.  EEG while in the hospital did not show seizure activity.  During the work-up for this she was found to have on CT head a meningioma, she was evaluated by neurosurgery with the determination that no further intervention was needed.  She was noted to be hyperglycemic with an A1c of above 11 and was started on insulin.  While in the hospital she was noted to have a cystic mass of the pancreatic head.  She tells me she is scheduled for what sounds like an endoscopic ultrasound on February 23.  She had acute on chronic kidney disease stage IV with a creatinine that maxed out at 2.2 and was 1.67 on discharge, her baseline is 1.5.  She is feeling well since discharge other than fatigued.  She does not believe that a follow-up with neurology was scheduled.  She is having a lot of what she describes as restless legs at nighttime.  Medication reconciliation was done today and patient is taking meds as recommended by discharging hospitalist/specialist.  Yes   Past Medical/Surgical History: Past Medical History:  Diagnosis Date   acute myeloblastic leukemia (AML) dx'd 06/2014   Acute myelogenous leukemia ([place]) 02/2014    Acute pancreatitis    Arthritis    "back" (09/17/2017)   Atrial flutter ([facility])    Cardiomyopathy ([place])    Chronic kidney disease, stage II (mild)    Chronic lower back pain    Colon polyps 04/29/2010   TUBULAR ADENOMA AND A SERRATED ADENOMA   COPD (chronic obstructive pulmonary disease) ([place])    CORONARY ARTERY DISEASE 12/24/2007   DIABETES MELLITUS, TYPE II 07/13/2007   Family history of cervical cancer    Family history of esophageal cancer    Family history of leukemia    Family history of lung cancer    Family history of skin cancer    History of blood transfusion 2015   "related to leukemia"   History of kidney stones 12/24/2007   History of uterine cancer    HYPERLIPIDEMIA 12/24/2007   HYPERTENSION 07/13/2007   NSTEMI (non-ST elevated myocardial infarction) ([place]) 09/17/2017   Unspecified disease of pancreas    Uterine cancer ([place])     Past Surgical History:  Procedure Laterality Date   ABDOMINAL HYSTERECTOMY     "still have my ovaries"   BREAST BIOPSY Left 09/22/2019   BREAST LUMPECTOMY Left 10/24/2019   BREAST LUMPECTOMY WITH RADIOACTIVE SEED LOCALIZATION Left 10/24/2019   Procedure: LEFT BREAST LUMPECTOMY WITH RADIOACTIVE SEED LOCALIZATION;  Surgeon: Jovita Kussmaul, MD;  Location: [facility];  Service: General;  Laterality: Left;   CARDIAC CATHETERIZATION  2002   CARDIAC CATHETERIZATION  09/17/2017   CATARACT EXTRACTION W/ INTRAOCULAR LENS  IMPLANT, BILATERAL Bilateral    COLONOSCOPY W/  BIOPSIES AND POLYPECTOMY  2011   CORONARY STENT INTERVENTION N/A 09/21/2017   Procedure: CORONARY STENT INTERVENTION;  Surgeon: Troy Sine, MD;  Location: [facility];  Service: Cardiovascular;  Laterality: N/A;   DILATION AND CURETTAGE OF UTERUS     EXCISIONAL HEMORRHOIDECTOMY  X 2   GANGLION CYST EXCISION Left X 3   INTRAVASCULAR PRESSURE WIRE/FFR STUDY N/A 09/17/2017   Procedure: INTRAVASCULAR PRESSURE WIRE/FFR STUDY;  Surgeon: Nelva Bush, MD;   Location: [facility];  Service: Cardiovascular;  Laterality: N/A;   NASAL SINUS SURGERY     PORTA CATH INSERTION Right 2013   RIGHT/LEFT HEART CATH AND CORONARY ANGIOGRAPHY N/A 09/17/2017   Procedure: RIGHT/LEFT HEART CATH AND CORONARY ANGIOGRAPHY;  Surgeon: Nelva Bush, MD;  Location: [facility];  Service: Cardiovascular;  Laterality: N/A;   TUBAL LIGATION      Social History:  reports that she quit smoking about 21 years ago. Her smoking use included cigarettes. She has a 20.00 pack-year smoking history. She has never used smokeless tobacco. She reports that she does not drink alcohol and does not use drugs.  Allergies: No Known Allergies  Family History:  Family History  Problem Relation Age of Onset   COPD Father    Esophageal cancer Father    Breast cancer Sister 39   Lung cancer Sister    Esophageal cancer Brother    Suicidality Brother    Lung cancer Sister    Lung cancer Sister    Cervical cancer Sister    Congestive Heart Failure Mother    Heart attack Maternal Grandmother    Skin cancer Maternal Grandfather    Heart attack Maternal Grandfather    Stroke Paternal Grandmother 70   Heart attack Paternal Grandfather    Cancer Maternal Uncle        unknown type, dx. >65   Cancer Paternal Aunt        unknown type, dx. >50   Cancer Paternal Uncle        unknown type, dx. >50   Cirrhosis Maternal Uncle    Skin cancer Daughter    Leukemia Cousin        maternal first cousin; dx. >50, in remission   Cancer Niece        unknown type behind her ear, dx. 30s/40s   Colon cancer Neg Hx      Current Outpatient Medications:    amLODipine (NORVASC) 10 MG tablet, Take 1 tablet (10 mg total) by mouth daily., Disp: 90 tablet, Rfl: 1   aspirin EC 81 MG tablet, Take 1 tablet (81 mg total) by mouth daily., Disp: , Rfl:    atorvastatin (LIPITOR) 40 MG tablet, Take 1 tablet (40 mg total) by mouth daily., Disp: 90 tablet, Rfl: 1   Blood Glucose Monitoring Suppl  (TRUE METRIX GO GLUCOSE METER) w/Device KIT, 30 Units by Does not apply route daily., Disp: 1 kit, Rfl: 0   Calcium Carbonate-Vit D-Min (CALTRATE 600+D PLUS MINERALS) 600-800 MG-UNIT TABS, Take 1 tablet by mouth daily., Disp: , Rfl:    Continuous Blood Gluc Sensor (DEXCOM G6 SENSOR) MISC, 1 Device by Does not apply route as directed., Disp: 9 each, Rfl: 3   Continuous Blood Gluc Transmit (DEXCOM G6 TRANSMITTER) MISC, 1 Device by Does not apply route as directed., Disp: 1 each, Rfl: 3   cyanocobalamin (,VITAMIN B-12,) 1000 MCG/ML injection, INJECT 1ML IN THE MUSCLE EVERY 30 DAYS AS DIRECTED, Disp: 3 mL, Rfl: 3  fluticasone furoate-vilanterol (BREO ELLIPTA) 200-25 MCG/INH AEPB, Inhale 1 puff into the lungs daily., Disp: 120 each, Rfl: 5   glucose blood (TRUE METRIX BLOOD GLUCOSE TEST) test strip, TEST BLOOD SUGAR THREE TIMES DAILY, Disp: 300 strip, Rfl: 2   HYDROcodone-acetaminophen (NORCO) 5-325 MG tablet, Take 1 tablet by mouth every 6 (six) hours as needed for moderate pain., Disp: 120 tablet, Rfl: 0   insulin aspart (NOVOLOG FLEXPEN) 100 UNIT/ML FlexPen, Max daily 60 units. Using sliding scale (Patient taking differently: Inject 10 Units into the skin daily with supper. Max daily 60 units. Using sliding scale pt did state she was taking 60 units), Disp: 30 mL, Rfl: 3   insulin glargine, 2 Unit Dial, (TOUJEO MAX SOLOSTAR) 300 UNIT/ML Solostar Pen, Inject 16 Units into the skin at bedtime., Disp: 15 mL, Rfl: 1   Insulin Pen Needle 32G X 4 MM MISC, 1 Device by Does not apply route in the morning, at noon, in the evening, and at bedtime., Disp: 400 each, Rfl: 3   isosorbide mononitrate (IMDUR) 30 MG 24 hr tablet, Take one and half tab daliy, Disp: 135 tablet, Rfl: 1   letrozole (FEMARA) 2.5 MG tablet, Take 1 tablet (2.5 mg total) by mouth daily., Disp: 90 tablet, Rfl: 3   levETIRAcetam (KEPPRA) 750 MG tablet, Take 1 tablet (750 mg total) by mouth 2 (two) times daily., Disp: 60 tablet, Rfl: 0   LORazepam  (ATIVAN) 0.5 MG tablet, TAKE 1 TABLET BY MOUTH EVERY 8 HOURS AS NEEDED FOR ANXIETY(DO NOT EXCEED MORE THAN 2 TABS IN 24 HRS) (Patient taking differently: Take 0.5 mg by mouth every 8 (eight) hours as needed. TAKE 1 TABLET BY MOUTH EVERY 8 HOURS AS NEEDED FOR ANXIETY(DO NOT EXCEED MORE THAN 2 TABS IN 24 HRS)), Disp: 60 tablet, Rfl: 2   MAGNESIUM OXIDE 400 PO, Take 400 mg by mouth 2 (two) times daily., Disp: , Rfl:    metoprolol succinate (TOPROL-XL) 100 MG 24 hr tablet, Take 1 tablet (100 mg total) by mouth daily., Disp: 90 tablet, Rfl: 1   nitroGLYCERIN (NITROSTAT) 0.4 MG SL tablet, PLACE 1 TABLET (0.4 MG TOTAL) UNDER THE TONGUE EVERY 5 (FIVE) MINUTES AS NEEDED FOR CHEST PAIN., Disp: 25 tablet, Rfl: 0   polyethylene glycol (MIRALAX / GLYCOLAX) 17 g packet, as needed., Disp: , Rfl:    potassium chloride (KLOR-CON) 10 MEQ tablet, Take 1 tablet (10 mEq total) by mouth daily., Disp: 90 tablet, Rfl: 1   SYRINGE-NEEDLE, DISP, 3 ML (BD SAFETYGLIDE SYRINGE/NEEDLE) 25G X 1" 3 ML MISC, Use for B12 injections, Disp: 100 each, Rfl: 11   clopidogrel (PLAVIX) 75 MG tablet, Take 1 tablet (75 mg total) by mouth every evening. (Patient not taking: Reported on 12/16/2021), Disp: 90 tablet, Rfl: 1 No current facility-administered medications for this visit.  Facility-Administered Medications Ordered in Other Visits:    sodium chloride 0.9 % 1,000 mL with magnesium sulfate 2 g infusion, , Intravenous, Continuous, Ladell Pier, MD, Stopped at 08/04/14 1441  Review of Systems:  Constitutional: Denies fever, chills, diaphoresis, appetite change. HEENT: Denies photophobia, eye pain, redness, hearing loss, ear pain, congestion, sore throat, rhinorrhea, sneezing, mouth sores, trouble swallowing, neck pain, neck stiffness and tinnitus.   Respiratory: Denies SOB, DOE, cough, chest tightness,  and wheezing.   Cardiovascular: Denies chest pain, palpitations and leg swelling.  Gastrointestinal: Denies nausea, vomiting,  abdominal pain, diarrhea, constipation, blood in stool and abdominal distention.  Genitourinary: Denies dysuria, urgency, frequency, hematuria, flank pain and difficulty  urinating.  Endocrine: Denies: hot or cold intolerance, sweats, changes in hair or nails, polyuria, polydipsia. Musculoskeletal: Denies myalgias, back pain, joint swelling, arthralgias and gait problem.  Skin: Denies pallor, rash and wound.  Neurological: Denies dizziness, seizures, syncope, weakness, light-headedness, numbness and headaches.  Hematological: Denies adenopathy. Easy bruising, personal or family bleeding history  Psychiatric/Behavioral: Denies suicidal ideation, mood changes, confusion, nervousness, sleep disturbance and agitation    Physical Exam: Vitals:   12/16/21 1449  BP: 140/60  Pulse: 65  Temp: 97.7 F (36.5 C)  TempSrc: Oral  SpO2: 99%  Weight: 156 lb (70.8 kg)    Body mass index is 28.53 kg/m.   Constitutional: NAD, calm, comfortable Eyes: PERRL, lids and conjunctivae normal, wears corrective lenses ENMT: Mucous membranes are moist.  Respiratory: clear to auscultation bilaterally, no wheezing, no crackles. Normal respiratory effort. No accessory muscle use.  Cardiovascular: Regular rate and rhythm, no murmurs / rubs / gallops. No extremity edema.  Neurologic: Grossly intact and nonfocal Psychiatric: Normal judgment and insight. Alert and oriented x 3. Normal mood.    Impression and Plan:  Hospital discharge follow-up  Acute renal failure superimposed on stage 4 chronic kidney disease, unspecified acute renal failure type ([place])  Seizure ([place]) - Plan: Ambulatory referral to Neurology  Vitamin B12 deficiency  Type 2 diabetes mellitus with stage 3b chronic kidney disease, with long-term current use of insulin ([place]) - Plan: CBC with Differential/Platelet, Comprehensive metabolic panel, Comprehensive metabolic panel, CBC with Differential/Platelet  Meningioma ([place])  Lesion of  pancreas  Restless legs - Plan: Magnesium, Phosphorus, Phosphorus, Magnesium  -Hospital charts have been reviewed in great detail. -I will order CBC and CMP that were recommended by discharging hospitalist mainly to follow her renal function. -She has an endoscopic ultrasound in February for follow-up of her pancreatic cystic mass. -She already has follow-up scheduled with endocrinology on January 11 due to her uncontrolled diabetes. -She has been taking her Keppra 750 mg twice daily as prescribed. -I will refill lorazepam that she has been on long-term as requested. -A follow-up appointment with neurology has not yet been scheduled, I will place referral today.   Medical decision making of high complexity was utilized today.  Patient Instructions  -Nice seeing you today!!  -Lab work today; will notify you once results are available.  -Will make sure you have follow up with neurology.  -Schedule follow up in 4 months.    Lelon Frohlich, MD [facility]

## 2021-12-16 NOTE — Patient Instructions (Signed)
-  Nice seeing you today!!  -Lab work today; will notify you once results are available.  -Will make sure you have follow up with neurology.  -Schedule follow up in 4 months.

## 2021-12-17 ENCOUNTER — Other Ambulatory Visit: Payer: Self-pay | Admitting: Internal Medicine

## 2021-12-17 ENCOUNTER — Other Ambulatory Visit: Payer: Self-pay

## 2021-12-17 DIAGNOSIS — F419 Anxiety disorder, unspecified: Secondary | ICD-10-CM

## 2021-12-17 DIAGNOSIS — G8929 Other chronic pain: Secondary | ICD-10-CM

## 2021-12-17 LAB — COMPREHENSIVE METABOLIC PANEL
ALT: 22 U/L (ref 0–35)
AST: 24 U/L (ref 0–37)
Albumin: 3.5 g/dL (ref 3.5–5.2)
Alkaline Phosphatase: 144 U/L — ABNORMAL HIGH (ref 39–117)
BUN: 21 mg/dL (ref 6–23)
CO2: 26 mEq/L (ref 19–32)
Calcium: 8.7 mg/dL (ref 8.4–10.5)
Chloride: 106 mEq/L (ref 96–112)
Creatinine, Ser: 1.58 mg/dL — ABNORMAL HIGH (ref 0.40–1.20)
GFR: 30.84 mL/min — ABNORMAL LOW (ref >60.00)
Glucose, Bld: 92 mg/dL (ref 70–99)
Potassium: 3.9 mEq/L (ref 3.5–5.1)
Sodium: 140 mEq/L (ref 135–145)
Total Bilirubin: 0.5 mg/dL (ref 0.2–1.2)
Total Protein: 6.4 g/dL (ref 6.0–8.3)

## 2021-12-17 LAB — CBC WITH DIFFERENTIAL/PLATELET
Basophils Absolute: 0 10*3/uL (ref 0.0–0.1)
Basophils Relative: 0.7 % (ref 0.0–3.0)
Eosinophils Absolute: 0.2 10*3/uL (ref 0.0–0.7)
Eosinophils Relative: 2.6 % (ref 0.0–5.0)
HCT: 33.6 % — ABNORMAL LOW (ref 36.0–46.0)
Hemoglobin: 11.2 g/dL — ABNORMAL LOW (ref 12.0–15.0)
Lymphocytes Relative: 24.4 % (ref 12.0–46.0)
Lymphs Abs: 1.7 10*3/uL (ref 0.7–4.0)
MCHC: 33.5 g/dL (ref 30.0–36.0)
MCV: 90.9 fl (ref 78.0–100.0)
Monocytes Absolute: 0.6 10*3/uL (ref 0.1–1.0)
Monocytes Relative: 8.2 % (ref 3.0–12.0)
Neutro Abs: 4.4 10*3/uL (ref 1.4–7.7)
Neutrophils Relative %: 64.1 % (ref 43.0–77.0)
Platelets: 177 10*3/uL (ref 150.0–400.0)
RBC: 3.7 Mil/uL — ABNORMAL LOW (ref 3.87–5.11)
RDW: 12.5 % (ref 11.5–15.5)
WBC: 6.9 10*3/uL (ref 4.0–10.5)

## 2021-12-17 LAB — MAGNESIUM: Magnesium: 1.9 mg/dL (ref 1.5–2.5)

## 2021-12-17 LAB — PHOSPHORUS: Phosphorus: 3.5 mg/dL (ref 2.3–4.6)

## 2021-12-17 MED ORDER — PANCRELIPASE (LIP-PROT-AMYL) 36000-114000 UNITS PO CPEP: 72000.0000 [IU] | ORAL_CAPSULE | Freq: Three times a day (TID) | ORAL | 0 refills | Status: DC

## 2021-12-17 NOTE — Telephone Encounter (Signed)
Patient called to get refill on LORazepam (ATIVAN) 0.5 MG tablet  HYDROcodone-acetaminophen (NORCO) 5-325 MG tablet  Please send to    [facility] [phone] - [place], [address] AT [place] Phone:  [phone]  Fax:  [phone]        Please advise

## 2021-12-18 ENCOUNTER — Encounter: Payer: Self-pay | Admitting: Oncology

## 2021-12-18 ENCOUNTER — Ambulatory Visit: Payer: Medicare HMO | Admitting: Internal Medicine

## 2021-12-18 NOTE — Progress Notes (Deleted)
Name: [name]  Age/ Sex: 81 y.o., female   MRN/ DOB: [id number], 1940-12-11     PCP: Isaac Bliss, Rayford Halsted, MD   Reason for Endocrinology Evaluation: Type 2 Diabetes Mellitus  Initial Endocrine Consultative Visit: 05/04/2019    PATIENT IDENTIFIER: [name] is a 81 y.o. 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 The patient has followed with Endocrinology clinic since 05/04/2019 for consultative assistance with management of her diabetes.  DIABETIC HISTORY:  [name] was diagnosed with T2DM in 2008, she was on metformin, SU and pioglitazone has been on and started insulin therapy many years ago. Her hemoglobin A1c has ranged from 6.6% in 2015, peaking at 12.4% in 2018.   Dexcom cost prohibitive 04/2021    She continues to work at an [employer] place.   SUBJECTIVE:   During the last visit (08/14/2021): A1c was 11.3 % , We adjusted MDI regimen      Today (12/18/2021): [name] is here for a follow up on diabetes management.  She checks her blood sugars occasionally .She has two meters  and has been  The patient has had hypoglycemic episodes mainly at night    She saw oncology with AML remission 09/23/2021 Dexcom cost prohibitive   Had an ED visit for  seizures and slurred speech  found significant hyperglycemia  at 642 mg/dL 11/2021  HOME DIABETES REGIMEN:  Toujeo 16 units QHS  Novolog  12 units TID QAC CF: Novolog (BG -140/25)       GLUCOSE METER:  Unable to download. Dates are incorrect showing November  49 -239     HISTORY:  Past Medical History:  Past Medical History:  Diagnosis Date   acute myeloblastic leukemia (AML) dx'd 06/2014   Acute myelogenous leukemia ([facility]) 02/2014   Acute pancreatitis    Arthritis    "back" (09/17/2017)   Atrial flutter ([facility])    Cardiomyopathy ([place])    Chronic kidney disease, stage II (mild)    Chronic lower back pain    Colon polyps  04/29/2010   TUBULAR ADENOMA AND A SERRATED ADENOMA   COPD (chronic obstructive pulmonary disease) ([place])    CORONARY ARTERY DISEASE 12/24/2007   DIABETES MELLITUS, TYPE II 07/13/2007   Family history of cervical cancer    Family history of esophageal cancer    Family history of leukemia    Family history of lung cancer    Family history of skin cancer    History of blood transfusion 2015   "related to leukemia"   History of kidney stones 12/24/2007   History of uterine cancer    HYPERLIPIDEMIA 12/24/2007   HYPERTENSION 07/13/2007   NSTEMI (non-ST elevated myocardial infarction) ([facility]) 09/17/2017   Unspecified disease of pancreas    Uterine cancer ([facility])    Past Surgical History:  Past Surgical History:  Procedure Laterality Date   ABDOMINAL HYSTERECTOMY     "still have my ovaries"   BREAST BIOPSY Left 09/22/2019   BREAST LUMPECTOMY Left 10/24/2019   BREAST LUMPECTOMY WITH RADIOACTIVE SEED LOCALIZATION Left 10/24/2019   Procedure: LEFT BREAST LUMPECTOMY WITH RADIOACTIVE SEED LOCALIZATION;  Surgeon: Jovita Kussmaul, MD;  Location: [facility];  Service: General;  Laterality: Left;   CARDIAC CATHETERIZATION  2002   CARDIAC CATHETERIZATION  09/17/2017   CATARACT EXTRACTION W/ INTRAOCULAR LENS  IMPLANT, BILATERAL Bilateral    COLONOSCOPY W/ BIOPSIES AND POLYPECTOMY  2011   CORONARY STENT INTERVENTION N/A 09/21/2017   Procedure: CORONARY STENT INTERVENTION;  Surgeon: Troy Sine, MD;  Location: [facility];  Service: Cardiovascular;  Laterality: N/A;   DILATION AND CURETTAGE OF UTERUS     EXCISIONAL HEMORRHOIDECTOMY  X 2   GANGLION CYST EXCISION Left X 3   INTRAVASCULAR PRESSURE WIRE/FFR STUDY N/A 09/17/2017   Procedure: INTRAVASCULAR PRESSURE WIRE/FFR STUDY;  Surgeon: Nelva Bush, MD;  Location: [facility];  Service: Cardiovascular;  Laterality: N/A;   NASAL SINUS SURGERY     PORTA CATH INSERTION Right 2013   RIGHT/LEFT HEART CATH AND CORONARY  ANGIOGRAPHY N/A 09/17/2017   Procedure: RIGHT/LEFT HEART CATH AND CORONARY ANGIOGRAPHY;  Surgeon: Nelva Bush, MD;  Location: [facility];  Service: Cardiovascular;  Laterality: N/A;   TUBAL LIGATION     Social History:  reports that she quit smoking about 21 years ago. Her smoking use included cigarettes. She has a 20.00 pack-year smoking history. She has never used smokeless tobacco. She reports that she does not drink alcohol and does not use drugs. Family History:  Family History  Problem Relation Age of Onset   COPD Father    Esophageal cancer Father    Breast cancer Sister 25   Lung cancer Sister    Esophageal cancer Brother    Suicidality Brother    Lung cancer Sister    Lung cancer Sister    Cervical cancer Sister    Congestive Heart Failure Mother    Heart attack Maternal Grandmother    Skin cancer Maternal Grandfather    Heart attack Maternal Grandfather    Stroke Paternal Grandmother 2   Heart attack Paternal Grandfather    Cancer Maternal Uncle        unknown type, dx. >65   Cancer Paternal Aunt        unknown type, dx. >50   Cancer Paternal Uncle        unknown type, dx. >50   Cirrhosis Maternal Uncle    Skin cancer Daughter    Leukemia Cousin        maternal first cousin; dx. >50, in remission   Cancer Niece        unknown type behind her ear, dx. 30s/40s   Colon cancer Neg Hx      HOME MEDICATIONS: Allergies as of 12/18/2021   No Known Allergies      Medication List        Accurate as of December 18, 2021  7:52 AM. If you have any questions, ask your nurse or doctor.          amLODipine 10 MG tablet Commonly known as: NORVASC Take 1 tablet (10 mg total) by mouth daily.   aspirin EC 81 MG tablet Take 1 tablet (81 mg total) by mouth daily.   atorvastatin 40 MG tablet Commonly known as: LIPITOR Take 1 tablet (40 mg total) by mouth daily.   BD SafetyGlide Syringe/Needle 25G X 1" 3 ML Misc Generic drug: SYRINGE-NEEDLE (DISP) 3  ML Use for B12 injections   Caltrate 600+D Plus Minerals 600-800 MG-UNIT Tabs Take 1 tablet by mouth daily.   clopidogrel 75 MG tablet Commonly known as: PLAVIX Take 1 tablet (75 mg total) by mouth every evening.   cyanocobalamin 1000 MCG/ML injection Commonly known as: (VITAMIN B-12) INJECT 1ML IN THE MUSCLE EVERY 30 DAYS AS DIRECTED   Dexcom G6 Sensor Misc 1 Device by Does not apply route as directed.  Dexcom G6 Transmitter Misc 1 Device by Does not apply route as directed.   fluticasone furoate-vilanterol 200-25 MCG/INH Aepb Commonly known as: Breo Ellipta Inhale 1 puff into the lungs daily.   HYDROcodone-acetaminophen 5-325 MG tablet Commonly known as: Norco Take 1 tablet by mouth every 6 (six) hours as needed for moderate pain.   Insulin Pen Needle 32G X 4 MM Misc 1 Device by Does not apply route in the morning, at noon, in the evening, and at bedtime.   isosorbide mononitrate 30 MG 24 hr tablet Commonly known as: IMDUR Take one and half tab daliy   letrozole 2.5 MG tablet Commonly known as: FEMARA Take 1 tablet (2.5 mg total) by mouth daily.   levETIRAcetam 750 MG tablet Commonly known as: Keppra Take 1 tablet (750 mg total) by mouth 2 (two) times daily.   lipase/protease/amylase 36000 UNITS Cpep capsule Commonly known as: Creon Take 2 capsules (72,000 Units total) by mouth 3 (three) times daily with meals.   LORazepam 0.5 MG tablet Commonly known as: ATIVAN TAKE 1 TABLET BY MOUTH EVERY 8 HOURS AS NEEDED FOR ANXIETY(DO NOT EXCEED MORE THAN 2 TABS IN 24 HRS)   MAGNESIUM OXIDE 400 PO Take 400 mg by mouth 2 (two) times daily.   metoprolol succinate 100 MG 24 hr tablet Commonly known as: TOPROL-XL Take 1 tablet (100 mg total) by mouth daily.   nitroGLYCERIN 0.4 MG SL tablet Commonly known as: NITROSTAT PLACE 1 TABLET (0.4 MG TOTAL) UNDER THE TONGUE EVERY 5 (FIVE) MINUTES AS NEEDED FOR CHEST PAIN.   NovoLOG FlexPen 100 UNIT/ML FlexPen Generic drug:  insulin aspart Max daily 60 units. Using sliding scale What changed:  how much to take how to take this when to take this additional instructions   polyethylene glycol 17 g packet Commonly known as: MIRALAX / GLYCOLAX as needed.   potassium chloride 10 MEQ tablet Commonly known as: KLOR-CON M Take 1 tablet (10 mEq total) by mouth daily.   Toujeo Max SoloStar 300 UNIT/ML Solostar Pen Generic drug: insulin glargine (2 Unit Dial) Inject 16 Units into the skin at bedtime.   True Metrix Blood Glucose Test test strip Generic drug: glucose blood TEST BLOOD SUGAR THREE TIMES DAILY   True Metrix Go Glucose Meter w/Device Kit 30 Units by Does not apply route daily.         OBJECTIVE:   Vital Signs: There were no vitals taken for this visit.  Wt Readings from Last 3 Encounters:  12/16/21 156 lb (70.8 kg)  12/09/21 161 lb 9.6 oz (73.3 kg)  09/23/21 160 lb (72.6 kg)     Exam: General: Pt appears well and is in NAD  Lungs: Clear with good BS bilat with no rales, rhonchi, or wheezes  Heart: RRR with normal S1 and S2 and no gallops; no murmurs; no rub  Abdomen: Normoactive bowel sounds, soft, nontender, without masses or organomegaly palpable  Extremities: 1+  pretibial edema.   Neuro: MS is good with appropriate affect, pt is alert and Ox3       DM foot exam: 08/14/2021 The skin of the feet is intact without sores or ulcerations. The pedal pulses are 1+ on right and 1+ on left. The sensation is decreased to a screening 5.07, 10 gram monofilament at the right great toe        DATA REVIEWED:  Lab Results  Component Value Date   HGBA1C 11.3 (H) 12/06/2021   HGBA1C 8.0 (A) 08/14/2021   HGBA1C 11.2 (A)  04/10/2021   Lab Results  Component Value Date   MICROALBUR 43.5 (H) 10/27/2018   LDLCALC 45 11/19/2020   CREATININE 1.58 (H) 12/16/2021   Lab Results  Component Value Date   MICRALBCREAT 101.4 (H) 10/27/2018     Lab Results  Component Value Date   CHOL 112  11/19/2020   HDL 49 11/19/2020   LDLCALC 45 11/19/2020   TRIG 94 11/19/2020   CHOLHDL 2.3 11/19/2020   ASSESSMENT / PLAN / RECOMMENDATIONS:   1) Type 2 Diabetes Mellitus, with improving glycemic control , With CKD III , neuropathy and Macrovascular complications - Most recent A1c of 8.0 %. Goal A1c < 7.5 %.     - Improved glycemic control  - She had subjective hypoglycemia in the office with a BG of 114 mg/dL, she was given two sips og juice with repeat Bg 82 mg/dL, she was then given 4 oz of juice incase her BG's continued to drop while on the road.  - Limited glycemic agents due to CKD and hx of pancreatitis  - She was provided with pt assistance papers for [facility] care as insulin is cost prohibitive  - Dexcom cost prohibitive as well  - Will reduce insulin as below   MEDICATIONS: Decrease  Toujeo to 14 units daily  Decrease  Novolog to 10  units TIDQAC CF: Novolog ( BG - 140/25)   EDUCATION / INSTRUCTIONS: BG monitoring instructions: Patient is instructed to check her blood sugars 3-4 times a day, before meals and bedtime . Call [facility] if: BG persistently < 70  I reviewed the Rule of 15 for the treatment of hypoglycemia in detail with the patient. Literature supplied.  2) Diabetic complications:  Eye: Does not have known diabetic retinopathy.  Neuro/ Feet: Does  have known diabetic peripheral neuropathy. Renal: Patient does have known baseline CKD.        F/U in 4 months   I spent 25 minutes preparing to see the patient by review of recent labs, imaging and procedures, obtaining and reviewing separately obtained history, communicating with the patient ordering medications, tests or procedures, and documenting clinical information in the EHR including the differential Dx, treatment, and any further evaluation and other management   Signed electronically by: Mack Guise, MD  [facility]  [address] [place], [place] [address] Phone: [phone] FAX: [phone]   CC: Isaac Bliss, Rayford Halsted, MD [place] [address] Phone: [phone]  Fax: [phone]  Return to Endocrinology clinic as below: Future Appointments  Date Time Provider [place]  12/18/2021  3:40 PM , Melanie Crazier, MD [facility] None  01/21/2022  2:30 PM [facility] PHLEBOTOMIST [facility] None  01/21/2022  2:45 PM [facility] FLUSH ROOM [facility] None  01/21/2022  3:00 PM Ladell Pier, MD [facility] None  01/24/2022  3:40 PM Nahser, Wonda Cheng, MD [facility]  04/16/2022  3:30 PM Erline Hau, MD [facility]

## 2021-12-19 MED ORDER — LORAZEPAM 0.5 MG PO TABS: ORAL_TABLET | ORAL | 2 refills | Status: DC

## 2021-12-19 MED ORDER — HYDROCODONE-ACETAMINOPHEN 5-325 MG PO TABS: 1.0000 | ORAL_TABLET | Freq: Four times a day (QID) | ORAL | 0 refills | Status: DC | PRN

## 2021-12-19 NOTE — Addendum Note (Signed)
Addended by: Rodrigo Ran on: 12/19/2021 01:31 PM   Modules accepted: Orders

## 2021-12-28 ENCOUNTER — Other Ambulatory Visit: Payer: Self-pay | Admitting: Internal Medicine

## 2021-12-28 DIAGNOSIS — E785 Hyperlipidemia, unspecified: Secondary | ICD-10-CM

## 2021-12-28 DIAGNOSIS — I2510 Atherosclerotic heart disease of native coronary artery without angina pectoris: Secondary | ICD-10-CM

## 2021-12-28 DIAGNOSIS — I10 Essential (primary) hypertension: Secondary | ICD-10-CM

## 2021-12-29 NOTE — Telephone Encounter (Signed)
Last lipid panel 11/19/20 Last OV: TOC appt 12/16/21

## 2022-01-02 ENCOUNTER — Telehealth: Payer: Self-pay

## 2022-01-02 NOTE — Telephone Encounter (Signed)
-----   Message from Timothy Lasso, RN sent at 12/10/2021 11:49 AM EST ----- Make sure to have plavix hold

## 2022-01-02 NOTE — Telephone Encounter (Signed)
Resent letter to Dr Jerilee Hoh for anti coag hold.

## 2022-01-06 ENCOUNTER — Telehealth: Payer: Self-pay | Admitting: Internal Medicine

## 2022-01-06 NOTE — Telephone Encounter (Signed)
Koren Shiver with GI is requesting to speak with some one regarding the patient plavix.  Koren Shiver could be contacted via Epic or [phone].  Please advise.

## 2022-01-06 NOTE — Telephone Encounter (Signed)
Lm for Patty to return call

## 2022-01-06 NOTE — Telephone Encounter (Signed)
Please advise 

## 2022-01-06 NOTE — Telephone Encounter (Signed)
Hi [name] this pt has an EUS scheduled on 2/23 and we need her to hold her plavix 5 days prior.  I have sent the request to Dr Jerilee Hoh but have not received a response.  I will send it to you as well.  Can you help with this? Thank you   12/10/2021   RE: [name] DOB: [birth year] MRN: [id number]   Dear Dr. Jerilee Hoh,     We have scheduled the above patient for an endoscopic procedure. Our records show that she is on anticoagulation therapy.   Please advise as to how long the patient may come off her therapy of plavix prior to the procedure, which is scheduled for 01/30/22.  Please fax back/ or route to Koren Shiver at [phone].  Sincerely,    Koren Shiver

## 2022-01-07 NOTE — Telephone Encounter (Signed)
The pt has returned call and has been given the ok to hold plavix 5 days prior to her procedure. The pt has been advised of the information and verbalized understanding.

## 2022-01-07 NOTE — Telephone Encounter (Signed)
Please see other phone note

## 2022-01-07 NOTE — Telephone Encounter (Signed)
Left message on machine to call back  

## 2022-01-13 ENCOUNTER — Other Ambulatory Visit: Payer: Medicare HMO

## 2022-01-13 ENCOUNTER — Ambulatory Visit: Payer: Medicare HMO | Admitting: Oncology

## 2022-01-16 ENCOUNTER — Ambulatory Visit: Payer: Medicare HMO | Admitting: Neurology

## 2022-01-16 ENCOUNTER — Encounter: Payer: Self-pay | Admitting: Neurology

## 2022-01-16 VITALS — BP 160/59 | HR 75 | Ht 62.0 in | Wt 160.0 lb

## 2022-01-16 DIAGNOSIS — Z79899 Other long term (current) drug therapy: Secondary | ICD-10-CM | POA: Diagnosis not present

## 2022-01-16 DIAGNOSIS — R569 Unspecified convulsions: Secondary | ICD-10-CM

## 2022-01-16 DIAGNOSIS — R7989 Other specified abnormal findings of blood chemistry: Secondary | ICD-10-CM | POA: Diagnosis not present

## 2022-01-16 DIAGNOSIS — R799 Abnormal finding of blood chemistry, unspecified: Secondary | ICD-10-CM | POA: Diagnosis not present

## 2022-01-16 MED ORDER — LEVETIRACETAM 500 MG PO TABS: 500.0000 mg | ORAL_TABLET | Freq: Two times a day (BID) | ORAL | 4 refills | Status: DC

## 2022-01-16 MED ORDER — LEVETIRACETAM 750 MG PO TABS: 750.0000 mg | ORAL_TABLET | Freq: Two times a day (BID) | ORAL | 4 refills | Status: DC

## 2022-01-16 NOTE — Patient Instructions (Addendum)
Decrease Keppra to 500 mg twice daily (GFR 31), will check a level today  Continue your other medications Driving restrictions for the next 6 months  Return in a year

## 2022-01-16 NOTE — Progress Notes (Signed)
[facility]  PATIENT: [name] DOB: Sep 06, 1941  REQUESTING CLINICIAN: Aline August, MD HISTORY FROM: Patient  REASON FOR VISIT: New onset seizures   HISTORICAL  CHIEF COMPLAINT:  Chief Complaint  Patient presents with   New Patient (Initial Visit)    Rm 13. Alone. NP Internal referral for seizures. No new seizures since hospital visit. Pt states she does not have further refills for Keppra.    HISTORY OF PRESENT ILLNESS:  This is a 81 year old woman with past medical history of leukemia in remission, heart failure, history of breast cancer, diabetes mellitus, hypertension hyperlipidemia and CKD stage IV who initially presented to the hospital on 12/29 for slurred speech described as inability to get words out.  Patient reported she was aware what was going on but could not speak.  At that time she was found to be hyperglycemic and admitted for insulin drip.  While in the hospital she was noted to have seizure-like activity involving the right eye right side of the lip with associated drooling and aphasia.  She was loaded on Keppra, had a overnight EEG which captured 1 seizure arising from the left temporal region lasting for 3 minutes.  She was then started on Keppra.  She was stabilized and later discharge.  Patient report of only being given 1 month supply of Keppra therefore she ran out 5 days ago.  Since leaving the hospital she has not had any seizure-like activity, denies any additional episode of aphasia. She denies any previous history of seizures  Handedness: right handed   Onset: December 29  Seizure Type: Focal onset  Current frequency: First lifetime seizures  Any injuries from seizures: No   Seizure risk factors: Denies, but she has a history of cancer  Previous ASMs: None  Currenty ASMs: Levetiracetam 750 mg twice daily  ASMs side effects: Denies  Brain Images: 17 mm anterior falcine meningioma without edema  Previous EEGs: Left  temporal slowing, seizures coming from the left temporal region.   OTHER MEDICAL CONDITIONS: Hypertension, hyperlipidemia, diabetes mellitus type 2, leukemia in his remission, history of breast cancer  REVIEW OF SYSTEMS: Full 14 system review of systems performed and negative with exception of: As noted in the HPI.  ALLERGIES: No Known Allergies  HOME MEDICATIONS: Outpatient Medications Prior to Visit  Medication Sig Dispense Refill   amLODipine (NORVASC) 10 MG tablet TAKE 1 TABLET EVERY DAY 90 tablet 1   aspirin EC 81 MG tablet Take 1 tablet (81 mg total) by mouth daily.     atorvastatin (LIPITOR) 40 MG tablet TAKE 1 TABLET EVERY DAY 90 tablet 1   Blood Glucose Monitoring Suppl (TRUE METRIX GO GLUCOSE METER) w/Device KIT 30 Units by Does not apply route daily. 1 kit 0   Calcium Carbonate-Vit D-Min (CALTRATE 600+D PLUS MINERALS) 600-800 MG-UNIT TABS Take 1 tablet by mouth daily.     clopidogrel (PLAVIX) 75 MG tablet TAKE 1 TABLET EVERY EVENING 90 tablet 1   Continuous Blood Gluc Sensor (DEXCOM G6 SENSOR) MISC 1 Device by Does not apply route as directed. 9 each 3   Continuous Blood Gluc Transmit (DEXCOM G6 TRANSMITTER) MISC 1 Device by Does not apply route as directed. 1 each 3   cyanocobalamin (,VITAMIN B-12,) 1000 MCG/ML injection INJECT 1ML IN THE MUSCLE EVERY 30 DAYS AS DIRECTED 3 mL 3   fluticasone furoate-vilanterol (BREO ELLIPTA) 200-25 MCG/INH AEPB Inhale 1 puff into the lungs daily. 120 each 5   glucose blood (TRUE METRIX BLOOD GLUCOSE  TEST) test strip TEST BLOOD SUGAR THREE TIMES DAILY 300 strip 2   HYDROcodone-acetaminophen (NORCO) 5-325 MG tablet Take 1 tablet by mouth every 6 (six) hours as needed for moderate pain. 120 tablet 0   insulin aspart (NOVOLOG FLEXPEN) 100 UNIT/ML FlexPen Max daily 60 units. Using sliding scale (Patient taking differently: Inject 10 Units into the skin daily with supper. Max daily 60 units. Using sliding scale pt did state she was taking 60 units) 30  mL 3   insulin glargine, 2 Unit Dial, (TOUJEO MAX SOLOSTAR) 300 UNIT/ML Solostar Pen Inject 16 Units into the skin at bedtime. 15 mL 1   Insulin Pen Needle 32G X 4 MM MISC 1 Device by Does not apply route in the morning, at noon, in the evening, and at bedtime. 400 each 3   isosorbide mononitrate (IMDUR) 30 MG 24 hr tablet TAKE 1 AND 1/2 TABLETS EVERY DAY 135 tablet 1   letrozole (FEMARA) 2.5 MG tablet Take 1 tablet (2.5 mg total) by mouth daily. 90 tablet 3   lipase/protease/amylase (CREON) 36000 UNITS CPEP capsule Take 2 capsules (72,000 Units total) by mouth 3 (three) times daily with meals. 48 capsule 0   LORazepam (ATIVAN) 0.5 MG tablet TAKE 1 TABLET BY MOUTH EVERY 8 HOURS AS NEEDED FOR ANXIETY(DO NOT EXCEED MORE THAN 2 TABS IN 24 HRS) 60 tablet 2   MAGNESIUM OXIDE 400 PO Take 400 mg by mouth 2 (two) times daily.     metoprolol succinate (TOPROL-XL) 100 MG 24 hr tablet TAKE 1 TABLET EVERY DAY 90 tablet 1   nitroGLYCERIN (NITROSTAT) 0.4 MG SL tablet PLACE 1 TABLET (0.4 MG TOTAL) UNDER THE TONGUE EVERY 5 (FIVE) MINUTES AS NEEDED FOR CHEST PAIN. 25 tablet 0   polyethylene glycol (MIRALAX / GLYCOLAX) 17 g packet as needed.     potassium chloride (KLOR-CON) 10 MEQ tablet TAKE 1 TABLET EVERY DAY 90 tablet 1   SYRINGE-NEEDLE, DISP, 3 ML (BD SAFETYGLIDE SYRINGE/NEEDLE) 25G X 1" 3 ML MISC Use for B12 injections 100 each 11   levETIRAcetam (KEPPRA) 750 MG tablet Take 1 tablet (750 mg total) by mouth 2 (two) times daily. 60 tablet 0   Facility-Administered Medications Prior to Visit  Medication Dose Route Frequency Provider Last Rate Last Admin   sodium chloride 0.9 % 1,000 mL with magnesium sulfate 2 g infusion   Intravenous Continuous Ladell Pier, MD   Stopped at 08/04/14 1441    PAST MEDICAL HISTORY: Past Medical History:  Diagnosis Date   acute myeloblastic leukemia (AML) dx'd 06/2014   Acute myelogenous leukemia ([place]) 02/2014   Acute pancreatitis    Arthritis    "back" (09/17/2017)    Atrial flutter (HCC)    Cardiomyopathy (HCC)    Chronic kidney disease, stage II (mild)    Chronic lower back pain    Colon polyps 04/29/2010   TUBULAR ADENOMA AND A SERRATED ADENOMA   COPD (chronic obstructive pulmonary disease) ([place])    CORONARY ARTERY DISEASE 12/24/2007   DIABETES MELLITUS, TYPE II 07/13/2007   Family history of cervical cancer    Family history of esophageal cancer    Family history of leukemia    Family history of lung cancer    Family history of skin cancer    History of blood transfusion 2015   "related to leukemia"   History of kidney stones 12/24/2007   History of uterine cancer    HYPERLIPIDEMIA 12/24/2007   HYPERTENSION 07/13/2007   NSTEMI (non-ST elevated myocardial infarction) (  [place]) 09/17/2017   Unspecified disease of pancreas    Uterine cancer ([place])     PAST SURGICAL HISTORY: Past Surgical History:  Procedure Laterality Date   ABDOMINAL HYSTERECTOMY     "still have my ovaries"   BREAST BIOPSY Left 09/22/2019   BREAST LUMPECTOMY Left 10/24/2019   BREAST LUMPECTOMY WITH RADIOACTIVE SEED LOCALIZATION Left 10/24/2019   Procedure: LEFT BREAST LUMPECTOMY WITH RADIOACTIVE SEED LOCALIZATION;  Surgeon: Jovita Kussmaul, MD;  Location: [facility];  Service: General;  Laterality: Left;   CARDIAC CATHETERIZATION  2002   CARDIAC CATHETERIZATION  09/17/2017   CATARACT EXTRACTION W/ INTRAOCULAR LENS  IMPLANT, BILATERAL Bilateral    COLONOSCOPY W/ BIOPSIES AND POLYPECTOMY  2011   CORONARY STENT INTERVENTION N/A 09/21/2017   Procedure: CORONARY STENT INTERVENTION;  Surgeon: Troy Sine, MD;  Location: [facility];  Service: Cardiovascular;  Laterality: N/A;   DILATION AND CURETTAGE OF UTERUS     EXCISIONAL HEMORRHOIDECTOMY  X 2   GANGLION CYST EXCISION Left X 3   INTRAVASCULAR PRESSURE WIRE/FFR STUDY N/A 09/17/2017   Procedure: INTRAVASCULAR PRESSURE WIRE/FFR STUDY;  Surgeon: Nelva Bush, MD;  Location: [facility];  Service:  Cardiovascular;  Laterality: N/A;   NASAL SINUS SURGERY     PORTA CATH INSERTION Right 2013   RIGHT/LEFT HEART CATH AND CORONARY ANGIOGRAPHY N/A 09/17/2017   Procedure: RIGHT/LEFT HEART CATH AND CORONARY ANGIOGRAPHY;  Surgeon: Nelva Bush, MD;  Location: [facility];  Service: Cardiovascular;  Laterality: N/A;   TUBAL LIGATION      FAMILY HISTORY: Family History  Problem Relation Age of Onset   COPD Father    Esophageal cancer Father    Breast cancer Sister 53   Lung cancer Sister    Esophageal cancer Brother    Suicidality Brother    Lung cancer Sister    Lung cancer Sister    Cervical cancer Sister    Congestive Heart Failure Mother    Heart attack Maternal Grandmother    Skin cancer Maternal Grandfather    Heart attack Maternal Grandfather    Stroke Paternal Grandmother 31   Heart attack Paternal Grandfather    Cancer Maternal Uncle        unknown type, dx. >65   Cancer Paternal Aunt        unknown type, dx. >50   Cancer Paternal Uncle        unknown type, dx. >50   Cirrhosis Maternal Uncle    Skin cancer Daughter    Leukemia Cousin        maternal first cousin; dx. >50, in remission   Cancer Niece        unknown type behind her ear, dx. 30s/40s   Colon cancer Neg Hx     SOCIAL HISTORY: Social History   Socioeconomic History   Marital status: Married    Spouse name: Not on file   Number of children: Not on file   Years of education: Not on file   Highest education level: Not on file  Occupational History   Occupation: works in a lab    Comment: makes glasses  Tobacco Use   Smoking status: Former    Packs/day: 1.00    Years: 20.00    Pack years: 20.00    Types: Cigarettes    Quit date: 12/08/2000    Years since quitting: 21.1   Smokeless tobacco: Never  Vaping Use   Vaping Use: Never used  Substance and Sexual  Activity   Alcohol use: No    Alcohol/week: 0.0 standard drinks   Drug use: No   Sexual activity: Never  Other Topics Concern    Not on file  Social History Narrative   Still works full-time assembling eye glasses $RemoveBef'@[employer]$    Social Determinants of Radio broadcast assistant Strain: Not on file  Food Insecurity: Not on file  Transportation Needs: Not on file  Physical Activity: Not on file  Stress: Not on file  Social Connections: Not on file  Intimate Partner Violence: Not on file    PHYSICAL EXAM  GENERAL EXAM/CONSTITUTIONAL: Vitals:  Vitals:   01/16/22 1510  BP: (!) 160/59  Pulse: 75  Weight: 160 lb (72.6 kg)  Height: $Remove'5\' 2"'lDtTQUV$  (1.575 m)   Body mass index is 29.26 kg/m. Wt Readings from Last 3 Encounters:  01/16/22 160 lb (72.6 kg)  12/16/21 156 lb (70.8 kg)  12/09/21 161 lb 9.6 oz (73.3 kg)   Patient is in no distress; well developed, nourished and groomed; neck is supple  CARDIOVASCULAR: Examination of carotid arteries is normal; no carotid bruits Regular rate and rhythm, no murmurs Examination of peripheral vascular system by observation and palpation is normal  EYES: Pupils round and reactive to light, Visual fields full to confrontation, Extraocular movements intacts,  No results found.  MUSCULOSKELETAL: Gait, strength, tone, movements noted in Neurologic exam below  NEUROLOGIC: MENTAL STATUS:  No flowsheet data found. awake, alert, oriented to person, place and time recent and remote memory intact normal attention and concentration language fluent, comprehension intact, naming intact fund of knowledge appropriate  CRANIAL NERVE:  2nd, 3rd, 4th, 6th - pupils equal and reactive to light, visual fields full to confrontation, extraocular muscles intact, no nystagmus 5th - facial sensation symmetric 7th - facial strength symmetric 8th - hearing intact 9th - palate elevates symmetrically, uvula midline 11th - shoulder shrug symmetric 12th - tongue protrusion midline  MOTOR:  normal bulk and tone, full strength in the BUE, BLE  SENSORY:  normal and symmetric to light touch,  pinprick, temperature, vibration  COORDINATION:  finger-nose-finger, fine finger movements normal  REFLEXES:  deep tendon reflexes present and symmetric  GAIT/STATION:  normal    DIAGNOSTIC DATA (LABS, IMAGING, TESTING) - I reviewed patient records, labs, notes, testing and imaging myself where available.  Lab Results  Component Value Date   WBC 6.9 12/16/2021   HGB 11.2 (L) 12/16/2021   HCT 33.6 (L) 12/16/2021   MCV 90.9 12/16/2021   PLT 177.0 12/16/2021      Component Value Date/Time   NA 140 12/16/2021 1522   NA 145 (H) 10/04/2020 1404   NA 144 09/04/2017 1436   K 3.9 12/16/2021 1522   K 4.1 09/04/2017 1436   CL 106 12/16/2021 1522   CO2 26 12/16/2021 1522   CO2 26 09/04/2017 1436   GLUCOSE 92 12/16/2021 1522   GLUCOSE 152 (H) 09/04/2017 1436   BUN 21 12/16/2021 1522   BUN 19 10/04/2020 1404   BUN 20.6 09/04/2017 1436   CREATININE 1.58 (H) 12/16/2021 1522   CREATININE 1.45 (H) 09/23/2021 1431   CREATININE 1.4 (H) 09/04/2017 1436   CALCIUM 8.7 12/16/2021 1522   CALCIUM 9.4 09/04/2017 1436   PROT 6.4 12/16/2021 1522   PROT 5.7 (L) 11/19/2020 0827   PROT 6.3 (L) 09/04/2017 1436   ALBUMIN 3.5 12/16/2021 1522   ALBUMIN 3.4 (L) 11/19/2020 0827   ALBUMIN 3.4 (L) 09/04/2017 1436   AST 24 12/16/2021 1522  AST 13 (L) 09/23/2021 1431   AST 20 09/04/2017 1436   ALT 22 12/16/2021 1522   ALT 9 09/23/2021 1431   ALT 15 09/04/2017 1436   ALKPHOS 144 (H) 12/16/2021 1522   ALKPHOS 84 09/04/2017 1436   BILITOT 0.5 12/16/2021 1522   BILITOT 0.4 09/23/2021 1431   BILITOT 0.68 09/04/2017 1436   GFRNONAA 31 (L) 12/09/2021 0630   GFRNONAA 37 (L) 09/23/2021 1431   GFRAA 39 (L) 10/04/2020 1404   GFRAA 39 (L) 03/29/2019 1459   Lab Results  Component Value Date   CHOL 112 11/19/2020   HDL 49 11/19/2020   LDLCALC 45 11/19/2020   TRIG 94 11/19/2020   Lab Results  Component Value Date   HGBA1C 11.3 (H) 12/06/2021   No results found for: VITAMINB12 Lab Results   Component Value Date   TSH 2.570 01/16/2022    EEG 12/07/2021 - Seizure without clinical signs, left temporal region - Intermittent slow, left temporal region   IMPRESSION: This study showed one seizure without clinical signs on 12/06/2021 at 2237 arising from left temporal region, lasting about 3 minutes. There is also cortical dysfunction in left temporal region likely secondary to underlying structural abnormality.   MRI Brain 12/06/2021 1. Aging brain without acute finding or explanation for symptoms. 2. 17 mm anterior falcine meningioma.  No adjacent brain edema.  I personally reviewed brain Images and previous EEG reports.   ASSESSMENT AND PLAN  81 y.o. year old female  with vascular risk factor including diabetes mellitus type 2, hypertension, hyperlipidemia, leukemia in remission, history of breast cancer in remission who was admitted for aphasia and word finding difficulty, initially was found to be hypertensive but later noted to have seizures coming from the left temporal region.  Patient was loaded with Levetiracetam and started on levetiracetam 750 mg twice daily.  She ran out of medication a few days ago. Since leaving the hospital, she has not had any additional seizures, denies any side effects from the medications while taking it.  At this time, I will restart her on levetiracetam but I will decrease to 500 mg twice daily because her GFR is 31.  She is using a mail order pharmacy therefore I gave her a sample of Brivaracetam (we do not have any sample of levetiracetam in the office) for 7 days.  I will see her in 1 year for follow-up.  Patient understands to contact me if she has any additional seizures or any additional concerns.   1. Seizure ([place])     Patient Instructions  Decrease Keppra to 500 mg twice daily (GFR 31), will check a level today  Continue your other medications Driving restrictions for the next 6 months  Return in a year    Per [facility]  statutes, patients with seizures are not allowed to drive until they have been seizure-free for six months.  Other recommendations include using caution when using heavy equipment or power tools. Avoid working on ladders or at heights. Take showers instead of baths.  Do not swim alone.  Ensure the water temperature is not too high on the home water heater. Do not go swimming alone. Do not lock yourself in a room alone (i.e. bathroom). When caring for infants or small children, sit down when holding, feeding, or changing them to minimize risk of injury to the child in the event you have a seizure. Maintain good sleep hygiene. Avoid alcohol.  Also recommend adequate sleep, hydration, good diet and minimize  stress.   During the Seizure  - First, ensure adequate ventilation and place patients on the floor on their left side  Loosen clothing around the neck and ensure the airway is patent. If the patient is clenching the teeth, do not force the mouth open with any object as this can cause severe damage - Remove all items from the surrounding that can be hazardous. The patient may be oblivious to what's happening and may not even know what he or she is doing. If the patient is confused and wandering, either gently guide him/her away and block access to outside areas - Reassure the individual and be comforting - Call 911. In most cases, the seizure ends before EMS arrives. However, there are cases when seizures may last over 3 to 5 minutes. Or the individual may have developed breathing difficulties or severe injuries. If a pregnant patient or a person with diabetes develops a seizure, it is prudent to call an ambulance. - Finally, if the patient does not regain full consciousness, then call EMS. Most patients will remain confused for about 45 to 90 minutes after a seizure, so you must use judgment in calling for help. - Avoid restraints but make sure the patient is in a bed with padded side rails - Place the  individual in a lateral position with the neck slightly flexed; this will help the saliva drain from the mouth and prevent the tongue from falling backward - Remove all nearby furniture and other hazards from the area - Provide verbal assurance as the individual is regaining consciousness - Provide the patient with privacy if possible - Call for help and start treatment as ordered by the caregiver   After the Seizure (Postictal Stage)  After a seizure, most patients experience confusion, fatigue, muscle pain and/or a headache. Thus, one should permit the individual to sleep. For the next few days, reassurance is essential. Being calm and helping reorient the person is also of importance.  Most seizures are painless and end spontaneously. Seizures are not harmful to others but can lead to complications such as stress on the lungs, brain and the heart. Individuals with prior lung problems may develop labored breathing and respiratory distress.     Orders Placed This Encounter  Procedures   Thyroid Panel With TSH   Levetiracetam level   Levetiracetam level    Meds ordered this encounter  Medications   DISCONTD: levETIRAcetam (KEPPRA) 750 MG tablet    Sig: Take 1 tablet (750 mg total) by mouth 2 (two) times daily.    Dispense:  180 tablet    Refill:  4   levETIRAcetam (KEPPRA) 500 MG tablet    Sig: Take 1 tablet (500 mg total) by mouth 2 (two) times daily.    Dispense:  180 tablet    Refill:  4    Return in about 1 year (around 01/16/2023).    Alric Ran, MD 01/17/2022, 1:12 PM  [facility] [address] [place], [place] [address] [phone]

## 2022-01-17 LAB — LEVETIRACETAM LEVEL: Levetiracetam Lvl: 19.5 ug/mL (ref 10.0–40.0)

## 2022-01-17 LAB — THYROID PANEL WITH TSH
Free Thyroxine Index: 2.1 (ref 1.2–4.9)
T3 Uptake Ratio: 30 % (ref 24–39)
T4, Total: 7 ug/dL (ref 4.5–12.0)
TSH: 2.57 u[IU]/mL (ref 0.450–4.500)

## 2022-01-20 ENCOUNTER — Other Ambulatory Visit: Payer: Self-pay | Admitting: *Deleted

## 2022-01-20 ENCOUNTER — Telehealth: Payer: Self-pay | Admitting: Internal Medicine

## 2022-01-20 ENCOUNTER — Other Ambulatory Visit: Payer: Self-pay

## 2022-01-20 ENCOUNTER — Telehealth: Payer: Self-pay | Admitting: *Deleted

## 2022-01-20 DIAGNOSIS — R569 Unspecified convulsions: Secondary | ICD-10-CM

## 2022-01-20 DIAGNOSIS — G8929 Other chronic pain: Secondary | ICD-10-CM

## 2022-01-20 MED ORDER — LEVETIRACETAM 500 MG PO TABS: 500.0000 mg | ORAL_TABLET | Freq: Two times a day (BID) | ORAL | 4 refills | Status: DC

## 2022-01-20 MED ORDER — HYDROCODONE-ACETAMINOPHEN 5-325 MG PO TABS: 1.0000 | ORAL_TABLET | Freq: Four times a day (QID) | ORAL | 0 refills | Status: DC | PRN

## 2022-01-20 NOTE — Telephone Encounter (Signed)
Spoke with patient and informed of results. Patient verbalized understanding and expressed appreciation for the call. All questions answered.

## 2022-01-20 NOTE — Telephone Encounter (Signed)
Spoke with pharmacist.  Lorazepam is available. Hydrocodone was sent to Dr Jerilee Hoh for approval.

## 2022-01-20 NOTE — Telephone Encounter (Signed)
-----   Message from Alric Ran, MD sent at 01/20/2022  9:55 AM EST ----- Please call and advise the patient that the recent labs we checked were within normal limits. We checked a keppra level and thyroid function. No further action is required on these tests at this time. Please remind patient to keep any upcoming appointments or tests and to call us with any interim questions, concerns, problems or updates. Thanks,   Alric Ran, MD

## 2022-01-20 NOTE — Telephone Encounter (Signed)
Patient requests refills for:  HYDROcodone-acetaminophen ([place]) 5-325 MG tablet  Last office visit 12/16/21

## 2022-01-20 NOTE — Progress Notes (Signed)
Rx refilled. Clarification was required for medication refill.

## 2022-01-20 NOTE — Telephone Encounter (Signed)
Patient called in requesting a refill for HYDROcodone-acetaminophen (NORCO) 5-325 MG tablet [[id number]]  and LORazepam (ATIVAN) 0.5 MG tablet [[id number]]  to be sent to her pharmacy. Patient stated that her pharmacy informed her that she didn't have anymore refills.  Please advise.

## 2022-01-20 NOTE — Progress Notes (Signed)
Please call and advise the patient that the recent labs we checked were within normal limits. We checked a keppra level and thyroid function. No further action is required on these tests at this time. Please remind patient to keep any upcoming appointments or tests and to call us with any interim questions, concerns, problems or updates. Thanks,   Alric Ran, MD

## 2022-01-21 ENCOUNTER — Other Ambulatory Visit: Payer: Medicare HMO

## 2022-01-21 ENCOUNTER — Ambulatory Visit: Payer: Medicare HMO | Admitting: Oncology

## 2022-01-22 ENCOUNTER — Inpatient Hospital Stay: Payer: Medicare HMO

## 2022-01-22 ENCOUNTER — Inpatient Hospital Stay: Payer: Medicare HMO | Admitting: Nurse Practitioner

## 2022-01-22 ENCOUNTER — Telehealth: Payer: Self-pay | Admitting: *Deleted

## 2022-01-22 ENCOUNTER — Telehealth: Payer: Self-pay

## 2022-01-22 ENCOUNTER — Telehealth: Payer: Self-pay | Admitting: Nurse Practitioner

## 2022-01-22 ENCOUNTER — Other Ambulatory Visit: Payer: Self-pay

## 2022-01-22 ENCOUNTER — Encounter (HOSPITAL_COMMUNITY): Payer: Self-pay | Admitting: Gastroenterology

## 2022-01-22 ENCOUNTER — Inpatient Hospital Stay: Payer: Medicare HMO | Attending: Oncology

## 2022-01-22 ENCOUNTER — Encounter: Payer: Self-pay | Admitting: Nurse Practitioner

## 2022-01-22 DIAGNOSIS — E1165 Type 2 diabetes mellitus with hyperglycemia: Secondary | ICD-10-CM | POA: Diagnosis not present

## 2022-01-22 DIAGNOSIS — I2510 Atherosclerotic heart disease of native coronary artery without angina pectoris: Secondary | ICD-10-CM | POA: Insufficient documentation

## 2022-01-22 DIAGNOSIS — I4891 Unspecified atrial fibrillation: Secondary | ICD-10-CM | POA: Insufficient documentation

## 2022-01-22 DIAGNOSIS — C787 Secondary malignant neoplasm of liver and intrahepatic bile duct: Secondary | ICD-10-CM | POA: Diagnosis not present

## 2022-01-22 DIAGNOSIS — Z95828 Presence of other vascular implants and grafts: Secondary | ICD-10-CM

## 2022-01-22 DIAGNOSIS — N289 Disorder of kidney and ureter, unspecified: Secondary | ICD-10-CM | POA: Insufficient documentation

## 2022-01-22 DIAGNOSIS — C9201 Acute myeloblastic leukemia, in remission: Secondary | ICD-10-CM | POA: Insufficient documentation

## 2022-01-22 DIAGNOSIS — Z86711 Personal history of pulmonary embolism: Secondary | ICD-10-CM | POA: Insufficient documentation

## 2022-01-22 DIAGNOSIS — C7951 Secondary malignant neoplasm of bone: Secondary | ICD-10-CM | POA: Diagnosis not present

## 2022-01-22 DIAGNOSIS — C50412 Malignant neoplasm of upper-outer quadrant of left female breast: Secondary | ICD-10-CM

## 2022-01-22 DIAGNOSIS — Z79899 Other long term (current) drug therapy: Secondary | ICD-10-CM | POA: Insufficient documentation

## 2022-01-22 DIAGNOSIS — C50912 Malignant neoplasm of unspecified site of left female breast: Secondary | ICD-10-CM | POA: Diagnosis not present

## 2022-01-22 DIAGNOSIS — K7460 Unspecified cirrhosis of liver: Secondary | ICD-10-CM | POA: Insufficient documentation

## 2022-01-22 DIAGNOSIS — Z170 Estrogen receptor positive status [ER+]: Secondary | ICD-10-CM | POA: Diagnosis not present

## 2022-01-22 LAB — CBC WITH DIFFERENTIAL (CANCER CENTER ONLY)
Abs Immature Granulocytes: 0.01 10*3/uL (ref 0.00–0.07)
Basophils Absolute: 0 10*3/uL (ref 0.0–0.1)
Basophils Relative: 0 %
Eosinophils Absolute: 0.2 10*3/uL (ref 0.0–0.5)
Eosinophils Relative: 4 %
HCT: 31.1 % — ABNORMAL LOW (ref 36.0–46.0)
Hemoglobin: 10.1 g/dL — ABNORMAL LOW (ref 12.0–15.0)
Immature Granulocytes: 0 %
Lymphocytes Relative: 23 %
Lymphs Abs: 1.1 10*3/uL (ref 0.7–4.0)
MCH: 29.9 pg (ref 26.0–34.0)
MCHC: 32.5 g/dL (ref 30.0–36.0)
MCV: 92 fL (ref 80.0–100.0)
Monocytes Absolute: 0.4 10*3/uL (ref 0.1–1.0)
Monocytes Relative: 9 %
Neutro Abs: 3.1 10*3/uL (ref 1.7–7.7)
Neutrophils Relative %: 64 %
Platelet Count: 137 10*3/uL — ABNORMAL LOW (ref 150–400)
RBC: 3.38 MIL/uL — ABNORMAL LOW (ref 3.87–5.11)
RDW: 13.1 % (ref 11.5–15.5)
WBC Count: 4.9 10*3/uL (ref 4.0–10.5)
nRBC: 0 % (ref 0.0–0.2)

## 2022-01-22 LAB — CMP (CANCER CENTER ONLY)
ALT: 13 U/L (ref 0–44)
AST: 18 U/L (ref 15–41)
Albumin: 3.2 g/dL — ABNORMAL LOW (ref 3.5–5.0)
Alkaline Phosphatase: 129 U/L — ABNORMAL HIGH (ref 38–126)
Anion gap: 8 (ref 5–15)
BUN: 23 mg/dL (ref 8–23)
CO2: 23 mmol/L (ref 22–32)
Calcium: 7.7 mg/dL — ABNORMAL LOW (ref 8.9–10.3)
Chloride: 105 mmol/L (ref 98–111)
Creatinine: 1.53 mg/dL — ABNORMAL HIGH (ref 0.44–1.00)
GFR, Estimated: 34 mL/min — ABNORMAL LOW (ref >60)
Glucose, Bld: 520 mg/dL (ref 70–99)
Potassium: 4.1 mmol/L (ref 3.5–5.1)
Sodium: 136 mmol/L (ref 135–145)
Total Bilirubin: 0.4 mg/dL (ref 0.3–1.2)
Total Protein: 5.9 g/dL — ABNORMAL LOW (ref 6.5–8.1)

## 2022-01-22 MED ORDER — HEPARIN SOD (PORK) LOCK FLUSH 100 UNIT/ML IV SOLN
500.0000 [IU] | Freq: Once | INTRAVENOUS | Status: AC
  Administered 2022-01-22: 500 [IU] via INTRAVENOUS

## 2022-01-22 MED ORDER — SODIUM CHLORIDE 0.9% FLUSH
10.0000 mL | Freq: Once | INTRAVENOUS | Status: AC
  Administered 2022-01-22: 10 mL via INTRAVENOUS

## 2022-01-22 MED ORDER — HEPARIN SOD (PORK) LOCK FLUSH 100 UNIT/ML IV SOLN: 500.0000 [IU] | Freq: Once | INTRAVENOUS | Status: DC

## 2022-01-22 MED ORDER — SODIUM CHLORIDE 0.9% FLUSH: 10.0000 mL | INTRAVENOUS | Status: DC | PRN

## 2022-01-22 NOTE — Telephone Encounter (Signed)
Manuela Schwartz from the [facility] called with a critical value glucose result of 520, which was performed today during the routine visit.  Manuela Schwartz stated she spoke with the patient, informed her of the results, advised to push fluids and to contact PCP's office.  Manuela Schwartz also stated she noticed the patient was a no-show for the scheduled visit with endo in January.  Message sent to PCP.

## 2022-01-22 NOTE — Telephone Encounter (Signed)
Spoke with the patient and informed her of the message below.  

## 2022-01-22 NOTE — Telephone Encounter (Signed)
CRITICAL VALUE STICKER  CRITICAL VALUE: glucose 520  RECEIVER (on-site recipient of call):Raizel Wesolowski,RN  DATE & TIME NOTIFIED: 01/22/22 @ 1617  MESSENGER (representative from lab):Simona Huh  MD NOTIFIED: Ned Card, NP  TIME OF NOTIFICATION:1618  RESPONSE:  Push fluids and call PCP. This RN called PCP office and spoke w/JoAnn. She will forward results to PCP. Patient was called by [facility] office nurse, Roland Rack.

## 2022-01-22 NOTE — Progress Notes (Signed)
[place] OFFICE PROGRESS NOTE   Diagnosis: AML, breast cancer  INTERVAL HISTORY:   [name] returns as scheduled.  She continues letrozole.  She was hospitalized 12/05/2021 through 12/09/2021 with seizure-like activity, slurred speech.  She was found to have significant hyperglycemia, initially on an insulin drip then switched to long-acting insulin.  CT abdomen/pelvis showed a pancreatic head lesion with a cystic appearance, diffuse pancreatic body and tail atrophy and ductal dilatation, cirrhotic liver, diffuse mixed lytic and sclerotic osseous metastatic lesions.  MRI abdomen 12/09/2021 showed a cystic lesion within the head of the pancreas measuring 4.6 x 3.4 x 4.2 cm, new from 02/23/2014; morphologic features of the liver compatible with cirrhosis; no suspicious bone lesions identified.  EUS is planned.  Overall [name] reports feeling well.  She has intermittent shortness of breath.  No change in chronic back and right-sided abdominal pain.  Objective:  Vital signs in last 24 hours:  Blood pressure (!) 170/45, pulse 81, temperature 97.8 F (36.6 C), temperature source Oral, resp. rate 18, height $RemoveBe'5\' 2"'RuRqahjNR$  (1.575 m), weight 159 lb 9.6 oz (72.4 kg), SpO2 98 %.     Lymphatics: No palpable cervical, supraclavicular, axillary or inguinal lymph nodes. Resp: Lungs clear bilaterally. Cardio: Regular rate and rhythm. GI: Abdomen soft and nontender.  No hepatosplenomegaly. Vascular: Trace edema lower leg bilaterally. Breast: Status post left lumpectomy.  No evidence for local tumor recurrence.  Right breast without mass. Port-A-Cath without erythema.   Lab Results:  Lab Results  Component Value Date   WBC 4.9 01/22/2022   HGB 10.1 (L) 01/22/2022   HCT 31.1 (L) 01/22/2022   MCV 92.0 01/22/2022   PLT 137 (L) 01/22/2022   NEUTROABS 3.1 01/22/2022    Imaging:  No results found.  Medications: I have reviewed the patient's current  medications.  Assessment/Plan: Acute myelogenous leukemia, monocytic differentiation diagnosed in March of 2015   Induction cytarabine/daunorubicin (7+3) followed by reinduction with cytarabine/daunorubicin and zinecard (5+2) with a recovering bone marrow 05/16/2014 consistent with remission   Status post cycle 1 high-dose araC consolidation 05/30/2014   Status post cycle 2 high-dose araC consolidation 07/11/2014. Status post cycle 3 high-dose araC consolidation 08/22/2014. Bone marrow 09/15/2014 showed necrotic marrow with minute focus of myeloid precursors. No evidence of viable acute leukemia. 2.  History of pancytopenia secondary to chemotherapy.   3. C. difficile colitis-06/09/2014 treated with Flagyl   4. Diabetes   5. renal insufficiency   6. history of hypokalemia and hypomagnesemia. 7. history of atrial fibrillation   9. history of coronary artery disease   10. Hospitalization with febrile neutropenia/sepsis secondary to Escherichia coli bacteremia 09/02/2014. 11. Shingles involving the right back October 2015. 12. Meningioma on brain CT 05/24/2014 and brain MRI 05/24/2014; brain CT 12/05/2021-stable calcified meningioma along the anterior falx; brain MRI 12/06/2021 with meningioma measuring 17 mm.  Seen by neurosurgery, no acute neurosurgical intervention indicated. 29. Hospitalization 08/24/2017 through 08/28/2017 with acute CHF. 14.  Admission 11/21/2017 with expressive aphasia, seizure activity noted on EEG, placed on Keppra 15.  Left breast cancer, T1NX, status post a left lumpectomy 10/24/2019-1.5 cm, grade 2, associated with high-grade DCIS with necrosis, ER 95%, PR 95%, HER-2 negative, KI-6710% Letrozole 11/02/2019 16.  ER 05/08/2020-dyspnea CT chest 05/08/2020-negative for pulmonary embolism, new patchy sclerosis throughout the thoracic skeleton concerning for bone metastases MRI thoracic spine 06/08/2020-consistent with successfully treated marrow replacement process in 2015,  current area of concern at T9 could represent a benign vascular lesion 17.  Admission  12/05/2021 through 12/09/2021 with seizures, hyperglycemia 18.  CT abdomen/pelvis 12/07/2021-cystic pancreas head lesion, cirrhotic liver, lytic and sclerotic bone lesions 19.  MRI abdomen 12/09/2021-large cystic lesion within the head of the pancreas, cirrhosis, no suspicious bone lesions      Disposition: [name] remains in clinical remission from AML.  She has overall stable anemia likely related to chronic disease, renal insufficiency and previous chemotherapy.  She is on adjuvant therapy for breast cancer with letrozole.  She was found to have a cystic pancreatic head mass on recent CT and MRI.  She is scheduled for an EUS next week.  The CT scan showed bone lesions concerning for metastatic disease.  No suspicious lesions were seen on the MRI.  We will obtain tumor markers today including CA 19-9 and CA 27-29.  She will return for follow-up on 02/07/2022.  She will contact the office in the interim with any problems.  Patient seen with Dr. [name].    Ned Card ANP/GNP-BC   01/22/2022  3:30 PM This was a shared visit with Ned Card.  [name] was interviewed and examined.  We reviewed the 12/07/2021 CT images.  She has a remote history of AML.  She has a history of early stage left-sided breast cancer and continues adjuvant letrozole.  She was noted to have a cystic pancreas mass, changes of cirrhosis, and possible bone metastases when she was admitted with hyperglycemia in December.  The pancreas lesion could be malignant or benign.  She is scheduled for a diagnostic EUS by Dr. Ardis Hughs.  Bone metastases were not noted on the abdominal MRI.  She has a history of a suspicious bone lesion in the past that was felt to be related to treatment of AML.  We will present her case at the GI tumor conference.  I will see her after the EUS.  I was present for greater than 50% of today's visit.  I  performed medical decision making.  Julieanne Manson, MD

## 2022-01-22 NOTE — Telephone Encounter (Signed)
Called and spoke with the patient to let her know her glucose is 520 and to call her PCP. Patient voiced understanding of instruction and had no further questions or concerns at this time

## 2022-01-22 NOTE — Telephone Encounter (Signed)
I called [name] to discuss the elevated blood sugar.  She has a sliding scale which she utilized about 15 minutes ago.  She understands to keep a close check on her blood sugar.  She will avoid concentrated sweets.  She will ensure she remains adequately hydrated.  She plans to follow-up with her PCP tomorrow morning.

## 2022-01-23 ENCOUNTER — Telehealth: Payer: Self-pay

## 2022-01-23 ENCOUNTER — Encounter: Payer: Self-pay | Admitting: Cardiovascular Disease

## 2022-01-23 NOTE — Telephone Encounter (Signed)
Called and spoke with the patient to check on her because of blood sugar was 520. The patient stated she is feeling fine and her blood sugar is 94 now.

## 2022-01-23 NOTE — Progress Notes (Signed)
Cardiology Office Note:    Date:  01/24/2022   ID:  [name], DOB 22-Dec-1940, MRN [id number]  PCP:  Isaac Bliss, Rayford Halsted, MD  Cardiologist:  Lottie Siska   Referring MD: Aline August, MD   Chief Complaint  Patient presents with   Congestive Heart Failure        Hypertension        Coronary Artery Disease    Previous notes    [name] is a 81 y.o. female with a hx of coronary artery disease and a non-ST segment elevation myocardial infarction.  She was admitted in #2018 with a non-ST segment elevation myocardial infarction in the setting of acute combined systolic and diastolic congestive heart failure.  Her ejection fraction was 40-45%.  Patient has chronic kidney disease and we treat her medically in an effort to avoid cardiac catheterization at that time.  She was seen in follow-up on October 10 and continued to have shortness of breath and occasional chest tightness.  We ultimately did do heart cath which showed severe three-vessel coronary artery disease.  She was seen by Dr. Servando Snare for surgical consultation.  She was thought to be too high risk for CABG. She subsequently had PCI to her right coronary artery.  He was found to have a moderate LAD stenosis with an FFR of 0.72 at that time.   She is been treated medically with the thought that we would intervene on the LAD if she continued to have episodes of chest pain and shortness of breath.  She is feeling well.   Some dyspnea.   No CP   May 30, 2020:  [name]  is seen today for follow-up of her coronary artery disease and chronic combined systolic and diastolic congestive heart failure.  She was seen in the emergency room for congestive heart failure on June 1.  She was not admitted but was sent home with the home health CHF nurses.  She received IV Lasix for several days. She had a recent echocardiogram performed May 08, 2020 which reveals normal left ventricular systolic function with EF of 55 to 60%. There  is trivial mitral regurgitation.    She was changed from furosemide to torsemide and is still feeling quite well. Still has some leg edema.   Breathing is much better.  Tries to avoid salt  We discussed leg elevation and compression hose.   Labs from June 16 reveal a creatinine of 1.64.  Her potassium is normal. I considered trying an ARB but since her creatinine has gone up a little bit I suspect that she will be better off staying with what she is on now.   Oct. 28, 2021: [name] is seen today for follow up of her CAD and CHF. Eats fast foods several times a week . Has had some leg swelling.   Is also short of breath  Has hx of leukemia. Hb is down to 7.  This may be partially responsible for her dyspnea and leg swelling   Her medical doctor increased her Demadex to 60 mg a day  She does not think that she has put out much additional urine today   Dec. 13, 2021: [name] is seen today for follow up of her CAD and CHF She was very anemic when I last saw her in Oct.  She has a hx of acute myelogenous leukemia  Has had some leg swelling ,  Better after she increased her torsemide  She takes torsemide 40 daily, every  so often she will add torsemide 20 mg in the evening to help with swelling .  We will increase her prescribed dose so that she will have enough tablets and not run out .  Check lipids and liver enz.  Today    Feb. 17, 2023 [name] is seen today for follow up of her CAD , CHF, Was diagnosed with seizures in Dec.   Those are well controlled at this point  No cp Has chronic dyspnea.  Her anemia is better   HbA1C is elevated.   Glucose levels are improving    Past Medical History:  Diagnosis Date   acute myeloblastic leukemia (AML) dx'd 06/2014   Acute myelogenous leukemia ([facility]) 02/2014   Acute pancreatitis    Arthritis    "back" (09/17/2017)   Atrial flutter (HCC)    Cardiomyopathy (H[facility])    Chronic kidney disease, stage II (mild)    Chronic lower back pain    Colon  polyps 04/29/2010   TUBULAR ADENOMA AND A SERRATED ADENOMA   COPD (chronic obstructive pulmonary disease) ([facility])    CORONARY ARTERY DISEASE 12/24/2007   DIABETES MELLITUS, TYPE II 07/13/2007   Family history of cervical cancer    Family history of esophageal cancer    Family history of leukemia    Family history of lung cancer    Family history of skin cancer    History of blood transfusion 2015   "related to leukemia"   History of kidney stones 12/24/2007   History of uterine cancer    HYPERLIPIDEMIA 12/24/2007   HYPERTENSION 07/13/2007   NSTEMI (non-ST elevated myocardial infarction) ([facility]) 09/17/2017   Unspecified disease of pancreas    Uterine cancer ([facility])     Past Surgical History:  Procedure Laterality Date   ABDOMINAL HYSTERECTOMY     "still have my ovaries"   BREAST BIOPSY Left 09/22/2019   BREAST LUMPECTOMY Left 10/24/2019   BREAST LUMPECTOMY WITH RADIOACTIVE SEED LOCALIZATION Left 10/24/2019   Procedure: LEFT BREAST LUMPECTOMY WITH RADIOACTIVE SEED LOCALIZATION;  Surgeon: Jovita Kussmaul, MD;  Location: [facility];  Service: General;  Laterality: Left;   CARDIAC CATHETERIZATION  2002   CARDIAC CATHETERIZATION  09/17/2017   CATARACT EXTRACTION W/ INTRAOCULAR LENS  IMPLANT, BILATERAL Bilateral    COLONOSCOPY W/ BIOPSIES AND POLYPECTOMY  2011   CORONARY STENT INTERVENTION N/A 09/21/2017   Procedure: CORONARY STENT INTERVENTION;  Surgeon: Troy Sine, MD;  Location: [facility];  Service: Cardiovascular;  Laterality: N/A;   DILATION AND CURETTAGE OF UTERUS     EXCISIONAL HEMORRHOIDECTOMY  X 2   GANGLION CYST EXCISION Left X 3   INTRAVASCULAR PRESSURE WIRE/FFR STUDY N/A 09/17/2017   Procedure: INTRAVASCULAR PRESSURE WIRE/FFR STUDY;  Surgeon: Nelva Bush, MD;  Location: [facility];  Service: Cardiovascular;  Laterality: N/A;   NASAL SINUS SURGERY     PORTA CATH INSERTION Right 2013   RIGHT/LEFT HEART CATH AND CORONARY ANGIOGRAPHY N/A  09/17/2017   Procedure: RIGHT/LEFT HEART CATH AND CORONARY ANGIOGRAPHY;  Surgeon: Nelva Bush, MD;  Location: [facility];  Service: Cardiovascular;  Laterality: N/A;   TUBAL LIGATION      Current Medications: Current Meds  Medication Sig   amLODipine (NORVASC) 10 MG tablet TAKE 1 TABLET EVERY DAY   atorvastatin (LIPITOR) 40 MG tablet TAKE 1 TABLET EVERY DAY   Blood Glucose Monitoring Suppl (TRUE METRIX GO GLUCOSE METER) w/Device KIT 30 Units by Does not apply route daily.   clopidogrel (  PLAVIX) 75 MG tablet TAKE 1 TABLET EVERY EVENING   Continuous Blood Gluc Sensor (DEXCOM G6 SENSOR) MISC 1 Device by Does not apply route as directed.   Continuous Blood Gluc Transmit (DEXCOM G6 TRANSMITTER) MISC 1 Device by Does not apply route as directed.   cyanocobalamin (,VITAMIN B-12,) 1000 MCG/ML injection INJECT 1ML IN THE MUSCLE EVERY 30 DAYS AS DIRECTED   fluticasone furoate-vilanterol (BREO ELLIPTA) 200-25 MCG/ACT AEPB Inhale 1 puff into the lungs as needed (wheezing SOB).   glucose blood (TRUE METRIX BLOOD GLUCOSE TEST) test strip TEST BLOOD SUGAR THREE TIMES DAILY   HYDROcodone-acetaminophen (NORCO) 5-325 MG tablet Take 1 tablet by mouth every 6 (six) hours as needed for moderate pain.   insulin glargine, 2 Unit Dial, (TOUJEO MAX SOLOSTAR) 300 UNIT/ML Solostar Pen Inject 16 Units into the skin at bedtime.   Insulin Pen Needle 32G X 4 MM MISC 1 Device by Does not apply route in the morning, at noon, in the evening, and at bedtime.   isosorbide mononitrate (IMDUR) 30 MG 24 hr tablet TAKE 1 AND 1/2 TABLETS EVERY DAY   letrozole (FEMARA) 2.5 MG tablet Take 1 tablet (2.5 mg total) by mouth daily.   levETIRAcetam (KEPPRA) 500 MG tablet Take 1 tablet (500 mg total) by mouth 2 (two) times daily.   lipase/protease/amylase (CREON) 36000 UNITS CPEP capsule Take 2 capsules (72,000 Units total) by mouth 3 (three) times daily with meals.   LORazepam (ATIVAN) 0.5 MG tablet TAKE 1 TABLET BY MOUTH  EVERY 8 HOURS AS NEEDED FOR ANXIETY(DO NOT EXCEED MORE THAN 2 TABS IN 24 HRS)   MAGNESIUM OXIDE 400 PO Take 400 mg by mouth 2 (two) times daily.   metoprolol succinate (TOPROL-XL) 100 MG 24 hr tablet TAKE 1 TABLET EVERY DAY   nitroGLYCERIN (NITROSTAT) 0.4 MG SL tablet PLACE 1 TABLET (0.4 MG TOTAL) UNDER THE TONGUE EVERY 5 (FIVE) MINUTES AS NEEDED FOR CHEST PAIN.   polyethylene glycol (MIRALAX / GLYCOLAX) 17 g packet Take 17 g by mouth daily as needed for mild constipation.   potassium chloride (KLOR-CON) 10 MEQ tablet TAKE 1 TABLET EVERY DAY   SYRINGE-NEEDLE, DISP, 3 ML (BD SAFETYGLIDE SYRINGE/NEEDLE) 25G X 1" 3 ML MISC Use for B12 injections   [DISCONTINUED] aspirin EC 81 MG tablet Take 1 tablet (81 mg total) by mouth daily.     Allergies:   Patient has no known allergies.   Social History   Socioeconomic History   Marital status: Married    Spouse name: Not on file   Number of children: Not on file   Years of education: Not on file   Highest education level: Not on file  Occupational History   Occupation: works in a lab    Comment: makes glasses  Tobacco Use   Smoking status: Former    Packs/day: 1.00    Years: 20.00    Pack years: 20.00    Types: Cigarettes    Quit date: 12/08/2000    Years since quitting: 21.1   Smokeless tobacco: Never  Vaping Use   Vaping Use: Never used  Substance and Sexual Activity   Alcohol use: No    Alcohol/week: 0.0 standard drinks   Drug use: No   Sexual activity: Never  Other Topics Concern   Not on file  Social History Narrative   Still works full-time assembling eye glasses _0    Social Determinants of Radio broadcast assistant Strain: Not on file  Food Insecurity: Not on file  Transportation Needs: Not on file  Physical Activity: Not on file  Stress: Not on file  Social Connections: Not on file     Family History: The patient's family history includes Breast cancer (age of onset: 10) in her sister; COPD in her father; Cancer in  her maternal uncle, niece, paternal aunt, and paternal uncle; Cervical cancer in her sister; Cirrhosis in her maternal uncle; Congestive Heart Failure in her mother; Esophageal cancer in her brother and father; Heart attack in her maternal grandfather, maternal grandmother, and paternal grandfather; Leukemia in her cousin; Lung cancer in her sister, sister, and sister; Skin cancer in her daughter and maternal grandfather; Stroke (age of onset: 57) in her paternal grandmother; Suicidality in her brother. There is no history of Colon cancer.  ROS:   Please see the history of present illness.     All other systems reviewed and are negative.  EKGs/Labs/Other Studies Reviewed:    The following studies were reviewed today:   EKG:    Recent Labs: 12/16/2021: Magnesium 1.9 01/16/2022: TSH 2.570 01/22/2022: ALT 13; BUN 23; Creatinine 1.53; Hemoglobin 10.1; Platelet Count 137; Potassium 4.1; Sodium 136  Recent Lipid Panel    Component Value Date/Time   CHOL 112 11/19/2020 0827   TRIG 94 11/19/2020 0827   HDL 49 11/19/2020 0827   CHOLHDL 2.3 11/19/2020 0827   CHOLHDL 4.5 11/22/2017 0354   VLDL 44 (H) 11/22/2017 0354   LDLCALC 45 11/19/2020 0827    Physical Exam:     Physical Exam: Blood pressure (!) 122/38, pulse 74, height _0  (1.575 m), weight 155 lb 3.2 oz (70.4 kg), SpO2 98 %.  GEN:  elderly female,  NAD , examined in the chair., was not strong enough to get up on the exam table  HEENT: Normal NECK: No JVD; bilat carotid bruits  LYMPHATICS: No lymphadenopathy CARDIAC: RRR  RESPIRATORY:  Clear to auscultation without rales, wheezing or rhonchi  ABDOMEN: Soft, non-tender, non-distended MUSCULOSKELETAL:  No edema; No deformity  SKIN: Warm and dry NEUROLOGIC:  Alert and oriented x 3     ASSESSMENT:    No diagnosis found.  PLAN:     1.  Coronary artery disease:   She is on both aspirin and Plavix.  We will discontinue the aspirin.  Continue Plavix.  She is not having any  episodes of angina.  2.  Essential hypertension:    Blood pressure is well controlled.   3.  Chronic diastolic congestive heart failure::  Her chronic diastolic congestive heart failure seems to be well controlled.  Continue current medications.   4.  Hyperlipidemia :   Managed by her primary medical doctor.    Medication Adjustments/Labs and Tests Ordered: Current medicines are reviewed at length with the patient today.  Concerns regarding medicines are outlined above.  No orders of the defined types were placed in this encounter.  No orders of the defined types were placed in this encounter.    Signed, Mertie Moores, MD  01/24/2022 4:28 PM    [facility]

## 2022-01-24 ENCOUNTER — Other Ambulatory Visit: Payer: Self-pay

## 2022-01-24 ENCOUNTER — Ambulatory Visit: Payer: Medicare HMO | Admitting: Cardiovascular Disease

## 2022-01-24 ENCOUNTER — Encounter: Payer: Self-pay | Admitting: Cardiovascular Disease

## 2022-01-24 VITALS — BP 122/38 | HR 74 | Ht 62.0 in | Wt 155.2 lb

## 2022-01-24 DIAGNOSIS — I5033 Acute on chronic diastolic (congestive) heart failure: Secondary | ICD-10-CM | POA: Diagnosis not present

## 2022-01-24 DIAGNOSIS — I10 Essential (primary) hypertension: Secondary | ICD-10-CM

## 2022-01-24 LAB — CANCER ANTIGEN 19-9: CA 19-9: 32 U/mL (ref 0–35)

## 2022-01-24 LAB — CANCER ANTIGEN 27.29: CA 27.29: 19.6 U/mL (ref 0.0–38.6)

## 2022-01-24 NOTE — Patient Instructions (Addendum)
Medication Instructions:  Your physician has recommended you make the following change in your medication:  1) STOP Aspirin  *If you need a refill on your cardiac medications before your next appointment, please call your pharmacy*   Lab Work: NONE If you have labs (blood work) drawn today and your tests are completely normal, you will receive your results only by: [facility] (if you have MyChart) OR A paper copy in the mail If you have any lab test that is abnormal or we need to change your treatment, we will call you to review the results.   Testing/Procedures: NONE   Follow-Up: At [facility], you and your health needs are our priority.  As part of our continuing mission to provide you with exceptional heart care, we have created designated Provider Care Teams.  These Care Teams include your primary Cardiologist (physician) and Advanced Practice Providers (APPs -  Physician Assistants and Nurse Practitioners) who all work together to provide you with the care you need, when you need it.  We recommend signing up for the patient portal called "MyChart".  Sign up information is provided on this After Visit Summary.  MyChart is used to connect with patients for Virtual Visits (Telemedicine).  Patients are able to view lab/test results, encounter notes, upcoming appointments, etc.  Non-urgent messages can be sent to your provider as well.   To learn more about what you can do with MyChart, go to [email or web address].    Your next appointment:   1 year(s)  The format for your next appointment:   In Person  Provider:   Mertie Moores, MD, or PA or NP

## 2022-01-29 NOTE — Anesthesia Preprocedure Evaluation (Addendum)
Anesthesia Evaluation  Patient identified by MRN, date of birth, ID band Patient awake    Reviewed: Allergy & Precautions, NPO status , Patient's Chart, lab work & pertinent test results  Airway Mallampati: I  TM Distance: >3 FB Neck ROM: Full    Dental  (+) Edentulous Upper, Edentulous Lower, Lower Dentures, Upper Dentures   Pulmonary COPD, former smoker,    Pulmonary exam normal breath sounds clear to auscultation       Cardiovascular hypertension, + CAD, + Past MI, + Cardiac Stents and +CHF  Normal cardiovascular exam+ dysrhythmias Atrial Fibrillation  Rhythm:Regular Rate:Normal  TTE 2021 1. Left ventricular ejection fraction, by estimation, is 55 to 60%. The  left ventricle has normal function. The left ventricle has no regional  wall motion abnormalities.  2. Right ventricular systolic function is normal. The right ventricular  size is normal.  3. The mitral valve is abnormal. Trivial mitral valve regurgitation.  4. The inferior vena cava is normal in size with greater than 50%  respiratory variability, suggesting right atrial pressure of 3 mmHg.   Cath 2018 A STENT SIERRA 2.50 X 23 MM drug eluting stent was successfully placed. Prox RCA to Mid RCA lesion, 90 %stenosed. Post intervention, there is a 0% residual stenosis.   Difficult but successful PCI to the mid RCA with ultimate insertion of a 2.523 mm Xience Sierra DES stent postdilated to 2.8 mm with the 90% stenosis being reduced to 0%.    Neuro/Psych Seizures -,  TIAnegative psych ROS   GI/Hepatic negative GI ROS, Neg liver ROS,   Endo/Other  diabetes  Renal/GU Renal InsufficiencyRenal disease  negative genitourinary   Musculoskeletal  (+) Arthritis ,   Abdominal   Peds  Hematology AML   Anesthesia Other Findings   Reproductive/Obstetrics                            Anesthesia Physical Anesthesia Plan  ASA:  3  Anesthesia Plan: MAC   Post-op Pain Management:    Induction: Intravenous  PONV Risk Score and Plan: Propofol infusion and Treatment may vary due to age or medical condition  Airway Management Planned: Natural Airway  Additional Equipment:   Intra-op Plan:   Post-operative Plan:   Informed Consent: I have reviewed the patients History and Physical, chart, labs and discussed the procedure including the risks, benefits and alternatives for the proposed anesthesia with the patient or authorized representative who has indicated his/her understanding and acceptance.     Dental advisory given  Plan Discussed with: CRNA  Anesthesia Plan Comments:         Anesthesia Quick Evaluation

## 2022-01-30 ENCOUNTER — Ambulatory Visit (HOSPITAL_BASED_OUTPATIENT_CLINIC_OR_DEPARTMENT_OTHER): Payer: Medicare HMO | Admitting: Anesthesiology

## 2022-01-30 ENCOUNTER — Encounter (HOSPITAL_COMMUNITY): Payer: Self-pay | Admitting: Gastroenterology

## 2022-01-30 ENCOUNTER — Ambulatory Visit (HOSPITAL_COMMUNITY): Payer: Medicare HMO | Admitting: Anesthesiology

## 2022-01-30 ENCOUNTER — Other Ambulatory Visit: Payer: Self-pay

## 2022-01-30 ENCOUNTER — Ambulatory Visit (HOSPITAL_COMMUNITY)
Admission: RE | Admit: 2022-01-30 | Discharge: 2022-01-30 | Disposition: A | Discharge status: Home / Self Care | Payer: Medicare HMO | Attending: Gastroenterology | Admitting: Gastroenterology

## 2022-01-30 ENCOUNTER — Encounter (HOSPITAL_COMMUNITY)
Admission: RE | Disposition: A | Discharge status: Home / Self Care | Payer: Self-pay | Source: Home / Self Care | Attending: Gastroenterology

## 2022-01-30 DIAGNOSIS — I509 Heart failure, unspecified: Secondary | ICD-10-CM | POA: Insufficient documentation

## 2022-01-30 DIAGNOSIS — I2510 Atherosclerotic heart disease of native coronary artery without angina pectoris: Secondary | ICD-10-CM | POA: Diagnosis not present

## 2022-01-30 DIAGNOSIS — I130 Hypertensive heart and chronic kidney disease with heart failure and stage 1 through stage 4 chronic kidney disease, or unspecified chronic kidney disease: Secondary | ICD-10-CM | POA: Insufficient documentation

## 2022-01-30 DIAGNOSIS — Z955 Presence of coronary angioplasty implant and graft: Secondary | ICD-10-CM | POA: Insufficient documentation

## 2022-01-30 DIAGNOSIS — K862 Cyst of pancreas: Secondary | ICD-10-CM | POA: Diagnosis not present

## 2022-01-30 DIAGNOSIS — J449 Chronic obstructive pulmonary disease, unspecified: Secondary | ICD-10-CM | POA: Diagnosis not present

## 2022-01-30 DIAGNOSIS — N182 Chronic kidney disease, stage 2 (mild): Secondary | ICD-10-CM | POA: Diagnosis not present

## 2022-01-30 DIAGNOSIS — E119 Type 2 diabetes mellitus without complications: Secondary | ICD-10-CM | POA: Diagnosis not present

## 2022-01-30 DIAGNOSIS — I5032 Chronic diastolic (congestive) heart failure: Secondary | ICD-10-CM

## 2022-01-30 DIAGNOSIS — I252 Old myocardial infarction: Secondary | ICD-10-CM

## 2022-01-30 DIAGNOSIS — G40909 Epilepsy, unspecified, not intractable, without status epilepticus: Secondary | ICD-10-CM | POA: Diagnosis not present

## 2022-01-30 DIAGNOSIS — E785 Hyperlipidemia, unspecified: Secondary | ICD-10-CM | POA: Diagnosis not present

## 2022-01-30 DIAGNOSIS — I110 Hypertensive heart disease with heart failure: Secondary | ICD-10-CM

## 2022-01-30 DIAGNOSIS — Z87891 Personal history of nicotine dependence: Secondary | ICD-10-CM | POA: Insufficient documentation

## 2022-01-30 DIAGNOSIS — E1122 Type 2 diabetes mellitus with diabetic chronic kidney disease: Secondary | ICD-10-CM | POA: Diagnosis not present

## 2022-01-30 DIAGNOSIS — K8020 Calculus of gallbladder without cholecystitis without obstruction: Secondary | ICD-10-CM | POA: Insufficient documentation

## 2022-01-30 DIAGNOSIS — Z8542 Personal history of malignant neoplasm of other parts of uterus: Secondary | ICD-10-CM | POA: Insufficient documentation

## 2022-01-30 DIAGNOSIS — K8689 Other specified diseases of pancreas: Secondary | ICD-10-CM | POA: Diagnosis not present

## 2022-01-30 HISTORY — PX: FINE NEEDLE ASPIRATION: SHX5430

## 2022-01-30 HISTORY — PX: EUS: SHX5427

## 2022-01-30 HISTORY — PX: ESOPHAGOGASTRODUODENOSCOPY: SHX5428

## 2022-01-30 LAB — GLUCOSE, CAPILLARY: Glucose-Capillary: 81 mg/dL (ref 70–99)

## 2022-01-30 SURGERY — UPPER ENDOSCOPIC ULTRASOUND (EUS) RADIAL
Anesthesia: Monitor Anesthesia Care

## 2022-01-30 MED ORDER — LIDOCAINE HCL 1 % IJ SOLN
INTRAMUSCULAR | Status: AC
  Filled 2022-01-30: qty 20

## 2022-01-30 MED ORDER — CIPROFLOXACIN HCL 500 MG PO TABS: 500.0000 mg | ORAL_TABLET | Freq: Two times a day (BID) | ORAL | 0 refills | Status: DC

## 2022-01-30 MED ORDER — LACTATED RINGERS IV SOLN
INTRAVENOUS | Status: DC
  Administered 2022-01-30: 1000 mL via INTRAVENOUS

## 2022-01-30 MED ORDER — CIPROFLOXACIN IN D5W 400 MG/200ML IV SOLN
INTRAVENOUS | Status: DC | PRN
  Administered 2022-01-30: 400 mg via INTRAVENOUS

## 2022-01-30 MED ORDER — PROPOFOL 500 MG/50ML IV EMUL
INTRAVENOUS | Status: DC | PRN
  Administered 2022-01-30: 100 ug/kg/min via INTRAVENOUS

## 2022-01-30 MED ORDER — PROPOFOL 10 MG/ML IV BOLUS
INTRAVENOUS | Status: DC | PRN
  Administered 2022-01-30 (×3): 20 mg via INTRAVENOUS

## 2022-01-30 MED ORDER — EPHEDRINE SULFATE-NACL 50-0.9 MG/10ML-% IV SOSY
PREFILLED_SYRINGE | INTRAVENOUS | Status: DC | PRN
  Administered 2022-01-30: 10 mg via INTRAVENOUS

## 2022-01-30 MED ORDER — PROPOFOL 1000 MG/100ML IV EMUL
INTRAVENOUS | Status: AC
  Filled 2022-01-30: qty 100

## 2022-01-30 MED ORDER — CIPROFLOXACIN IN D5W 400 MG/200ML IV SOLN
INTRAVENOUS | Status: AC
  Filled 2022-01-30: qty 200

## 2022-01-30 MED ORDER — LIDOCAINE 2% (20 MG/ML) 5 ML SYRINGE
INTRAMUSCULAR | Status: DC | PRN
  Administered 2022-01-30: 80 mg via INTRAVENOUS

## 2022-01-30 NOTE — Transfer of Care (Signed)
Immediate Anesthesia Transfer of Care Note  Patient: [name]  Procedure(s) Performed: UPPER ENDOSCOPIC ULTRASOUND (EUS) RADIAL ESOPHAGOGASTRODUODENOSCOPY (EGD) FINE NEEDLE ASPIRATION (FNA) LINEAR  Patient Location: PACU  Anesthesia Type:MAC  Level of Consciousness: awake, alert  and oriented  Airway & Oxygen Therapy: Patient Spontanous Breathing and Patient connected to face mask oxygen  Post-op Assessment: Report given to RN and Post -op Vital signs reviewed and stable  Post vital signs: Reviewed and stable  Last Vitals:  Vitals Value Taken Time  BP    Temp    Pulse 72 01/30/22 0819  Resp 21 01/30/22 0819  SpO2 100 % 01/30/22 0819  Vitals shown include unvalidated device data.  Last Pain:  Vitals:   01/30/22 0653  TempSrc: Temporal  PainSc: 0-No pain         Complications: No notable events documented.

## 2022-01-30 NOTE — H&P (Signed)
HPI: This is a woman with seemingly incidental lesion in pancreatic head.  Referred for this EUS by AE, MS after brief hospital consult 6 weeks ago.    She's never had problems with her pancreas (however same lesion was present on CT about 2 years ago). No pancreatic disease in family. No weight loss. Never etoh abuser.  She has lower abd pains, no upper abd pains.  Stopped plavix 5 days ago   ROS: complete GI ROS as described in HPI, all other review negative.  Constitutional:  No unintentional weight loss   Past Medical History:  Diagnosis Date   acute myeloblastic leukemia (AML) dx'd 06/2014   Acute myelogenous leukemia ([place]) 02/2014   Acute pancreatitis    Arthritis    "back" (09/17/2017)   Atrial flutter (HCC)    Cardiomyopathy (HCC)    Chronic kidney disease, stage II (mild)    Chronic lower back pain    Colon polyps 04/29/2010   TUBULAR ADENOMA AND A SERRATED ADENOMA   COPD (chronic obstructive pulmonary disease) ([place])    CORONARY ARTERY DISEASE 12/24/2007   DIABETES MELLITUS, TYPE II 07/13/2007   Family history of cervical cancer    Family history of esophageal cancer    Family history of leukemia    Family history of lung cancer    Family history of skin cancer    History of blood transfusion 2015   "related to leukemia"   History of kidney stones 12/24/2007   History of uterine cancer    HYPERLIPIDEMIA 12/24/2007   HYPERTENSION 07/13/2007   NSTEMI (non-ST elevated myocardial infarction) ([place]) 09/17/2017   Unspecified disease of pancreas    Uterine cancer ([place])     Past Surgical History:  Procedure Laterality Date   ABDOMINAL HYSTERECTOMY     "still have my ovaries"   BREAST BIOPSY Left 09/22/2019   BREAST LUMPECTOMY Left 10/24/2019   BREAST LUMPECTOMY WITH RADIOACTIVE SEED LOCALIZATION Left 10/24/2019   Procedure: LEFT BREAST LUMPECTOMY WITH RADIOACTIVE SEED LOCALIZATION;  Surgeon: Jovita Kussmaul, MD;  Location: [facility];  Service: General;   Laterality: Left;   CARDIAC CATHETERIZATION  2002   CARDIAC CATHETERIZATION  09/17/2017   CATARACT EXTRACTION W/ INTRAOCULAR LENS  IMPLANT, BILATERAL Bilateral    COLONOSCOPY W/ BIOPSIES AND POLYPECTOMY  2011   CORONARY STENT INTERVENTION N/A 09/21/2017   Procedure: CORONARY STENT INTERVENTION;  Surgeon: Troy Sine, MD;  Location: [facility];  Service: Cardiovascular;  Laterality: N/A;   DILATION AND CURETTAGE OF UTERUS     EXCISIONAL HEMORRHOIDECTOMY  X 2   GANGLION CYST EXCISION Left X 3   INTRAVASCULAR PRESSURE WIRE/FFR STUDY N/A 09/17/2017   Procedure: INTRAVASCULAR PRESSURE WIRE/FFR STUDY;  Surgeon: Nelva Bush, MD;  Location: [facility];  Service: Cardiovascular;  Laterality: N/A;   NASAL SINUS SURGERY     PORTA CATH INSERTION Right 2013   RIGHT/LEFT HEART CATH AND CORONARY ANGIOGRAPHY N/A 09/17/2017   Procedure: RIGHT/LEFT HEART CATH AND CORONARY ANGIOGRAPHY;  Surgeon: Nelva Bush, MD;  Location: [facility];  Service: Cardiovascular;  Laterality: N/A;   TUBAL LIGATION      Current Facility-Administered Medications  Medication Dose Route Frequency Provider Last Rate Last Admin   lactated ringers infusion   Intravenous Continuous Milus Banister, MD       Facility-Administered Medications Ordered in Other Encounters  Medication Dose Route Frequency Provider Last Rate Last Admin   sodium chloride 0.9 % 1,000 mL with magnesium sulfate  2 g infusion   Intravenous Continuous Ladell Pier, MD   Stopped at 08/04/14 1441    Allergies as of 12/10/2021   (No Known Allergies)    Family History  Problem Relation Age of Onset   COPD Father    Esophageal cancer Father    Breast cancer Sister 34   Lung cancer Sister    Esophageal cancer Brother    Suicidality Brother    Lung cancer Sister    Lung cancer Sister    Cervical cancer Sister    Congestive Heart Failure Mother    Heart attack Maternal Grandmother    Skin cancer Maternal  Grandfather    Heart attack Maternal Grandfather    Stroke Paternal Grandmother 35   Heart attack Paternal Grandfather    Cancer Maternal Uncle        unknown type, dx. >65   Cancer Paternal Aunt        unknown type, dx. >50   Cancer Paternal Uncle        unknown type, dx. >50   Cirrhosis Maternal Uncle    Skin cancer Daughter    Leukemia Cousin        maternal first cousin; dx. >50, in remission   Cancer Niece        unknown type behind her ear, dx. 30s/40s   Colon cancer Neg Hx     Social History   Socioeconomic History   Marital status: Married    Spouse name: Not on file   Number of children: Not on file   Years of education: Not on file   Highest education level: Not on file  Occupational History   Occupation: works in a lab    Comment: makes glasses  Tobacco Use   Smoking status: Former    Packs/day: 1.00    Years: 20.00    Pack years: 20.00    Types: Cigarettes    Quit date: 12/08/2000    Years since quitting: 21.1   Smokeless tobacco: Never  Vaping Use   Vaping Use: Never used  Substance and Sexual Activity   Alcohol use: No    Alcohol/week: 0.0 standard drinks   Drug use: No   Sexual activity: Never  Other Topics Concern   Not on file  Social History Narrative   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    Social Determinants of Radio broadcast assistant Strain: Not on file  Food Insecurity: Not on file  Transportation Needs: Not on file  Physical Activity: Not on file  Stress: Not on file  Social Connections: Not on file  Intimate Partner Violence: Not on file     Physical Exam: BP (!) 175/37    Pulse 61    Temp 97.8 F (36.6 C) (Temporal)    Resp 20    SpO2 100%  Constitutional: generally well-appearing Psychiatric: alert and oriented x3 Abdomen: soft, nontender, nondistended, no obvious ascites, no peritoneal signs, normal bowel sounds No peripheral edema noted in lower extremities  Assessment and plan: 81 y.o. female with  incidentally abnormal pancreas  For eus evaluation today  Please see the "Patient Instructions" section for addition details about the plan.  Owens Loffler, MD [facility] 01/30/2022, 7:12 AM

## 2022-01-30 NOTE — Op Note (Addendum)
[facility] Patient Name: [name] Procedure Date: 01/30/2022 MRN: [id number] Attending MD: Milus Banister , MD Date of Birth: [birth year] CSN: [id number] Age: 81 Admit Type: Outpatient Procedure:                Upper EUS Indications:              Incidental lesion in pancreatic head (complex,                            clearlyl involves dilated main PD, probably present                            since CT 05/2020). Referred for direct EUS by AE, MS                            after hospital consult 6 weeks ago. She's never had                            problems with her pancreas (however same lesion was                            present on CT about 2 years ago). No pancreatic                            disease in family. No weight loss. Never etoh                            abuser. She has lower abd pains but no upper abd                            pains. Providers:                Milus Banister, MD, Carlyn Reichert, RN, Frazier Richards, Technician Referring MD:             Nicoletta Ba, [place]; Lucio Edward, MD; Scarlette Shorts, MD Medicines:                Monitored Anesthesia Care, cipro 400mg  IV Complications:            No immediate complications. Estimated blood loss:                            None. Estimated Blood Loss:     Estimated blood loss: none. Procedure:                Pre-Anesthesia Assessment:                           - Prior to the procedure, a History and Physical                            was performed, and patient medications and  allergies were reviewed. The patient's tolerance of                            previous anesthesia was also reviewed. The risks                            and benefits of the procedure and the sedation                            options and risks were discussed with the patient.                            All questions were answered, and informed consent                             was obtained. Prior Anticoagulants: The patient has                            taken Plavix (clopidogrel), last dose was 5 days                            prior to procedure. ASA Grade Assessment: III - A                            patient with severe systemic disease. After                            reviewing the risks and benefits, the patient was                            deemed in satisfactory condition to undergo the                            procedure.                           After obtaining informed consent, the endoscope was                            passed under direct vision. Throughout the                            procedure, the patient's blood pressure, pulse, and                            oxygen saturations were monitored continuously. The                            GF-[id number]-[id number] ([id number]) Olympus radial ultrasound                            scope was introduced through the mouth, and  advanced to the second part of duodenum. The                            [id number] ([id number]) Olympus linear ultrasound scope                            was introduced through the mouth, and advanced to                            the second part of duodenum. The upper EUS was                            accomplished without difficulty. The patient                            tolerated the procedure well. Scope In: Scope Out: Findings:      ENDOSCOPIC FINDING (limted views with radial, linear echoendoscopes): :      The examined esophagus was endoscopically normal.      The entire examined stomach was endoscopically normal.      The examined duodenum was endoscopically normal including normal       appearing major papilla.      ENDOSONOGRAPHIC FINDING: :      1. Well circumscribed complex cystic lesion in the head of the pancreas.       This very heterogeneous cystic measures 4.6cm maximally, appears to       contain gelatinous consistency material and it  clearly communicates with       a dilated main pancreatic duct (23mm in head, 33mm in body). Multiple       areas of shadowing mural calcifications limit visualization somewhat       however no obvious solid components are seen. I sampled the lesion with       two transduodenal passes (1 with 22 guage, 1 with 19 guage) using EUS       FNA needles. These aspirates yielded a small amount of very thick,       gelatinous blood tinged and somewhat purulent material which was all       sent for cytology evaluation. There was not enough aspirate even with       the larger bore 19 guage needle to also send for CEA, amylase testing.      2. Main pancreatic duct dilated and somewhat ectatic, as above.      3. Pancreatic parenchyma in the neck, body and tail was atrophic.      4. CBD was normal, non-dilated.      5. Several small shadowing gallstones in the gallbladder.      6. Limited views of the liver, spleen, portal and splenic vessels were       all normal. Impression:               - No obvious signs of portal hypertension (limited                            views with echoendoscopes however).                           - Unusual, very heterogeneous 4.6cm cystic lesion  in the head of pancreas that clearly communicates                            with dilated main pancreatic duct. There are                            scattered mural calicfications in the wall of the                            lesion, suggesting some chronicity and CT in 05/2020                            showed abnormal main pancreatic duct. The cystic                            lesion contains very thick, gelatinous, blood                            tinged, somewhat purulent appearing fluid that was                            difficult to aspirate even with large bore 19 guage                            needle. Preliminary cytology review showed mucin,                            debris, blood, fibrin and  possibly purulence. No                            neoplastic changes noted. Await final cytology                            review. This may represent main duct IPMN, perhaps                            a pancreatic pseudocyst. Given suggestion of                            purulence I am going to put her on 1 week of twice                            daily cipro for now. I don't think it is causing                            any symptoms and given lack of weight loss I doubt                            this harbors cancer currently. Moderate Sedation:      Not Applicable - Patient had care per Anesthesia. Recommendation:           - Discharge patient to home. Procedure Code(s):        ---  Professional ---                           [phone], Esophagogastroduodenoscopy, flexible,                            transoral; with transendoscopic ultrasound-guided                            intramural or transmural fine needle                            aspiration/biopsy(s), (includes endoscopic                            ultrasound examination limited to the esophagus,                            stomach or duodenum, and adjacent structures) Diagnosis Code(s):        --- Professional ---                           S92.3, Cyst of pancreas CPT copyright 2019 American Medical Association. All rights reserved. The codes documented in this report are preliminary and upon coder review may  be revised to meet current compliance requirements. Milus Banister, MD 01/30/2022 8:34:30 AM This report has been signed electronically. Number of Addenda: 0

## 2022-01-30 NOTE — Discharge Instructions (Signed)
YOU HAD AN ENDOSCOPIC PROCEDURE TODAY: Refer to the procedure report and other information in the discharge instructions given to you for any specific questions about what was found during the examination. If this information does not answer your questions, please call [place] office at [phone] to clarify.  ° °YOU SHOULD EXPECT: Some feelings of bloating in the abdomen. Passage of more gas than usual. Walking can help get rid of the air that was put into your GI tract during the procedure and reduce the bloating. If you had a lower endoscopy (such as a colonoscopy or flexible sigmoidoscopy) you may notice spotting of blood in your stool or on the toilet paper. Some abdominal soreness may be present for a day or two, also. ° °DIET: Your first meal following the procedure should be a light meal and then it is ok to progress to your normal diet. A half-sandwich or bowl of soup is an example of a good first meal. Heavy or fried foods are harder to digest and may make you feel nauseous or bloated. Drink plenty of fluids but you should avoid alcoholic beverages for 24 hours. If you had a esophageal dilation, please see attached instructions for diet.   ° °ACTIVITY: Your care partner should take you home directly after the procedure. You should plan to take it easy, moving slowly for the rest of the day. You can resume normal activity the day after the procedure however YOU SHOULD NOT DRIVE, use power tools, machinery or perform tasks that involve climbing or major physical exertion for 24 hours (because of the sedation medicines used during the test).  ° °SYMPTOMS TO REPORT IMMEDIATELY: °A gastroenterologist can be reached at any hour. Please call [phone]  for any of the following symptoms:  °Following lower endoscopy (colonoscopy, flexible sigmoidoscopy) °Excessive amounts of blood in the stool  °Significant tenderness, worsening of abdominal pains  °Swelling of the abdomen that is new, acute  °Fever of 100° or  higher  °Following upper endoscopy (EGD, EUS, ERCP, esophageal dilation) °Vomiting of blood or coffee ground material  °New, significant abdominal pain  °New, significant chest pain or pain under the shoulder blades  °Painful or persistently difficult swallowing  °New shortness of breath  °Black, tarry-looking or red, bloody stools ° °FOLLOW UP:  °If any biopsies were taken you will be contacted by phone or by letter within the next 1-3 weeks. Call [phone]  if you have not heard about the biopsies in 3 weeks.  °Please also call with any specific questions about appointments or follow up tests. ° °

## 2022-01-30 NOTE — Anesthesia Postprocedure Evaluation (Signed)
Anesthesia Post Note  Patient: [name]  Procedure(s) Performed: UPPER ENDOSCOPIC ULTRASOUND (EUS) RADIAL ESOPHAGOGASTRODUODENOSCOPY (EGD) FINE NEEDLE ASPIRATION (FNA) LINEAR     Patient location during evaluation: Endoscopy Anesthesia Type: MAC Level of consciousness: awake and alert Pain management: pain level controlled Vital Signs Assessment: post-procedure vital signs reviewed and stable Respiratory status: spontaneous breathing, nonlabored ventilation, respiratory function stable and patient connected to nasal cannula oxygen Cardiovascular status: blood pressure returned to baseline and stable Postop Assessment: no apparent nausea or vomiting Anesthetic complications: no   No notable events documented.  Last Vitals:  Vitals:   01/30/22 0829 01/30/22 0839  BP: 136/90 (!) 155/53  Pulse: 71 74  Resp: 19 17  Temp:    SpO2: 100% 98%    Last Pain:  Vitals:   01/30/22 0839  TempSrc:   PainSc: 0-No pain                 Consepcion Utt L Hamna Asa

## 2022-01-31 ENCOUNTER — Encounter (HOSPITAL_COMMUNITY): Payer: Self-pay | Admitting: Gastroenterology

## 2022-01-31 LAB — CYTOLOGY - NON PAP

## 2022-02-05 ENCOUNTER — Other Ambulatory Visit: Payer: Self-pay

## 2022-02-05 NOTE — Progress Notes (Signed)
The proposed treatment discussed in conference is for discussion purpose only and is not a binding recommendation.  The patients have not been physically examined, or presented with their treatment options.  Therefore, final treatment plans cannot be decided.  

## 2022-02-07 ENCOUNTER — Inpatient Hospital Stay: Payer: Medicare HMO | Attending: Oncology | Admitting: Nurse Practitioner

## 2022-02-07 DIAGNOSIS — K8689 Other specified diseases of pancreas: Secondary | ICD-10-CM | POA: Insufficient documentation

## 2022-02-07 DIAGNOSIS — Z79899 Other long term (current) drug therapy: Secondary | ICD-10-CM | POA: Insufficient documentation

## 2022-02-07 DIAGNOSIS — E119 Type 2 diabetes mellitus without complications: Secondary | ICD-10-CM | POA: Insufficient documentation

## 2022-02-07 DIAGNOSIS — I4891 Unspecified atrial fibrillation: Secondary | ICD-10-CM | POA: Insufficient documentation

## 2022-02-07 DIAGNOSIS — I2510 Atherosclerotic heart disease of native coronary artery without angina pectoris: Secondary | ICD-10-CM | POA: Insufficient documentation

## 2022-02-07 DIAGNOSIS — C9201 Acute myeloblastic leukemia, in remission: Secondary | ICD-10-CM | POA: Insufficient documentation

## 2022-02-07 DIAGNOSIS — M899 Disorder of bone, unspecified: Secondary | ICD-10-CM | POA: Insufficient documentation

## 2022-02-07 DIAGNOSIS — C50912 Malignant neoplasm of unspecified site of left female breast: Secondary | ICD-10-CM | POA: Insufficient documentation

## 2022-02-14 ENCOUNTER — Inpatient Hospital Stay: Payer: Medicare HMO | Admitting: Nurse Practitioner

## 2022-02-14 ENCOUNTER — Other Ambulatory Visit: Payer: Self-pay

## 2022-02-14 VITALS — BP 163/60 | HR 64 | Temp 97.8°F | Resp 20 | Ht 62.0 in | Wt 157.8 lb

## 2022-02-14 DIAGNOSIS — Z170 Estrogen receptor positive status [ER+]: Secondary | ICD-10-CM

## 2022-02-14 DIAGNOSIS — I4891 Unspecified atrial fibrillation: Secondary | ICD-10-CM | POA: Diagnosis not present

## 2022-02-14 DIAGNOSIS — C50412 Malignant neoplasm of upper-outer quadrant of left female breast: Secondary | ICD-10-CM

## 2022-02-14 DIAGNOSIS — I2510 Atherosclerotic heart disease of native coronary artery without angina pectoris: Secondary | ICD-10-CM | POA: Diagnosis not present

## 2022-02-14 DIAGNOSIS — E119 Type 2 diabetes mellitus without complications: Secondary | ICD-10-CM | POA: Diagnosis not present

## 2022-02-14 DIAGNOSIS — C9201 Acute myeloblastic leukemia, in remission: Secondary | ICD-10-CM

## 2022-02-14 DIAGNOSIS — K8689 Other specified diseases of pancreas: Secondary | ICD-10-CM | POA: Diagnosis not present

## 2022-02-14 DIAGNOSIS — M899 Disorder of bone, unspecified: Secondary | ICD-10-CM | POA: Diagnosis not present

## 2022-02-14 DIAGNOSIS — C50912 Malignant neoplasm of unspecified site of left female breast: Secondary | ICD-10-CM | POA: Diagnosis not present

## 2022-02-14 DIAGNOSIS — Z79899 Other long term (current) drug therapy: Secondary | ICD-10-CM | POA: Diagnosis not present

## 2022-02-14 MED ORDER — LETROZOLE 2.5 MG PO TABS: 2.5000 mg | ORAL_TABLET | Freq: Every day | ORAL | 3 refills | Status: AC

## 2022-02-14 NOTE — Progress Notes (Signed)
?[facility] ?OFFICE PROGRESS NOTE ? ? ?Diagnosis: AML, breast cancer ? ?INTERVAL HISTORY:  ? ?[name] returns as scheduled.  Her case with presented at the GI tumor conference.  She is seen today for further discussion regarding the pancreatic head mass and bone lesions. ? ?She is tired.  She works full-time.  Stable chronic back pain.  She completed the course of Cipro as prescribed.  No fever. ? ?Objective: ? ?Vital signs in last 24 hours: ? ?Blood pressure (!) 163/60, pulse 64, temperature 97.8 ?F (36.6 ?C), temperature source Oral, resp. rate 20, height $RemoveBe'5\' 2"'jBfBKDtMl$  (1.575 m), weight 157 lb 12.8 oz (71.6 kg), SpO2 98 %. ?  ?Resp: Distant breath sounds.  No respiratory distress. ?Cardio: Regular rate and rhythm. ?GI: No hepatosplenomegaly. ?Vascular: No leg edema. ?Port-A-Cath without erythema. ? ?Lab Results: ? ?Lab Results  ?Component Value Date  ? WBC 4.9 01/22/2022  ? HGB 10.1 (L) 01/22/2022  ? HCT 31.1 (L) 01/22/2022  ? MCV 92.0 01/22/2022  ? PLT 137 (L) 01/22/2022  ? NEUTROABS 3.1 01/22/2022  ? ? ?Imaging: ? ?No results found. ? ?Medications: I have reviewed the patient's current medications. ? ?Assessment/Plan: ?Acute myelogenous leukemia, monocytic differentiation diagnosed in March of 2015   ?Induction cytarabine/daunorubicin (7+3) followed by reinduction with cytarabine/daunorubicin and zinecard (5+2) with a recovering bone marrow 05/16/2014 consistent with remission   ?Status post cycle 1 high-dose araC consolidation 05/30/2014   ?Status post cycle 2 high-dose araC consolidation 07/11/2014. ?Status post cycle 3 high-dose araC consolidation 08/22/2014. ?Bone marrow 09/15/2014 showed necrotic marrow with minute focus of myeloid precursors. No evidence of viable acute leukemia. ?2.  History of pancytopenia secondary to chemotherapy.   ?3. C. difficile colitis-06/09/2014 treated with Flagyl   ?4. Diabetes   ?5. renal insufficiency   ?6. history of hypokalemia and hypomagnesemia. ?7. history of  atrial fibrillation   ?9. history of coronary artery disease   ?10. Hospitalization with febrile neutropenia/sepsis secondary to Escherichia coli bacteremia 09/02/2014. ?11. Shingles involving the right back October 2015. ?12. Meningioma on brain CT 05/24/2014 and brain MRI 05/24/2014; brain CT 12/05/2021-stable calcified meningioma along the anterior falx; brain MRI 12/06/2021 with meningioma measuring 17 mm.  Seen by neurosurgery, no acute neurosurgical intervention indicated. ?13. Hospitalization 08/24/2017 through 08/28/2017 with acute CHF. ?14.  Admission 11/21/2017 with expressive aphasia, seizure activity noted on EEG, placed on Keppra ?15.  Left breast cancer, T1NX, status post a left lumpectomy 10/24/2019-1.5 cm, grade 2, associated with high-grade DCIS with necrosis, ER 95%, PR 95%, HER-2 negative, KI-6710% ?Letrozole 11/02/2019 ?16.  ER 05/08/2020-dyspnea ?CT chest 05/08/2020-negative for pulmonary embolism, new patchy sclerosis throughout the thoracic skeleton concerning for bone metastases ?MRI thoracic spine 06/08/2020-consistent with successfully treated marrow replacement process in 2015, current area of concern at T9 could represent a benign vascular lesion ?17.  Admission 12/05/2021 through 12/09/2021 with seizures, hyperglycemia ?18.  CT abdomen/pelvis 12/07/2021-cystic pancreas head lesion, cirrhotic liver, lytic and sclerotic bone lesions.  Bone lesions felt to  ?19.  MRI abdomen 12/09/2021-large cystic lesion within the head of the pancreas, cirrhosis, no suspicious bone lesions on upper EUS 01/30/2022-well-circumscribed complex cystic lesion in the head of the pancreas, measures 4.6 cm, appears to contain gelatinous consistency material and clearly communicates with a dilated main pancreatic duct.  Cytology, no malignant cells identified.  Treated with Cipro.  Per GI note repeat CT 3 to 4 months. ?  ? ?Disposition: [name] appears stable.  Her case was presented at the GI tumor  conference.  Cytology on  the cystic pancreatic head lesion was negative for malignancy.  Repeat CT in 3 to 4 months planned.  The bone lesions are likely related to previous AML.  No further evaluation needed. ? ?She is scheduled to see Dr. Florene Glen at [facility] 03/14/2022.  She will return for a port flush here 05/09/2022.  We will see her in follow-up with a port flush 07/04/2022.  She will contact the office in the interim with any problems. ? ?Patient seen with Dr. [name]. ? ? ? ?Ned Card ANP/GNP-BC  ? ?02/14/2022  ?3:19 PM ?This was a shared visit with Ned Card.  [name] case was presented at the GI tumor conference.  The cystic pancreas lesion is felt to be benign.  She will have CT follow-up with gastroenterology.  The bone lesions are felt to be a sequelae of AML treatment. ? ?The plan is to continue routine follow-up of the AML. ? ?I was present for greater than 50% of today's visit.  I performed medical decision making. ? ?Julieanne Manson, MD ? ? ? ? ? ? ?

## 2022-02-15 ENCOUNTER — Encounter: Payer: Self-pay | Admitting: Oncology

## 2022-02-26 ENCOUNTER — Other Ambulatory Visit: Payer: Self-pay | Admitting: Nurse Practitioner

## 2022-02-26 ENCOUNTER — Other Ambulatory Visit: Payer: Self-pay | Admitting: General Surgery

## 2022-02-26 DIAGNOSIS — Z9889 Other specified postprocedural states: Secondary | ICD-10-CM

## 2022-02-26 DIAGNOSIS — C50412 Malignant neoplasm of upper-outer quadrant of left female breast: Secondary | ICD-10-CM

## 2022-02-26 DIAGNOSIS — K869 Disease of pancreas, unspecified: Secondary | ICD-10-CM

## 2022-02-26 DIAGNOSIS — C9201 Acute myeloblastic leukemia, in remission: Secondary | ICD-10-CM

## 2022-02-27 ENCOUNTER — Other Ambulatory Visit: Payer: Self-pay

## 2022-02-27 ENCOUNTER — Telehealth: Payer: Self-pay

## 2022-02-27 NOTE — Telephone Encounter (Signed)
-----   Message from Owens Shark, NP sent at 02/26/2022  3:42 PM EDT ----- ?Please let her know we have ordered a CT scan to follow-up the pancreas lesion in early May. ? ?

## 2022-02-27 NOTE — Telephone Encounter (Signed)
Printer out an appointment reminder and mailed out (CT scan 5/12) at [facility] ?

## 2022-02-27 NOTE — Telephone Encounter (Signed)
Patient gave verbal understanding ?

## 2022-03-04 ENCOUNTER — Other Ambulatory Visit: Payer: Self-pay | Admitting: Cardiovascular Disease

## 2022-03-04 DIAGNOSIS — I5033 Acute on chronic diastolic (congestive) heart failure: Secondary | ICD-10-CM

## 2022-03-05 ENCOUNTER — Other Ambulatory Visit: Payer: Self-pay | Admitting: *Deleted

## 2022-03-05 MED ORDER — "BD SAFETYGLIDE SYRINGE/NEEDLE 25G X 1"" 3 ML MISC": 11 refills | Status: DC

## 2022-03-05 NOTE — Telephone Encounter (Signed)
Spoke with patient regarding Torsemide. ? ?Torsemide '20mg'$  was discontinued at discharge from hospital 12/09/21. Patient states she was not aware of this change and has been taking Torsemide '20mg'$  2  tablets every AM and 1 tablet every PM. ? ?Patient is requesting refill on this medication, she states she has enough to last a week and a half. ? ?Will forward to Dr. Acie Fredrickson to advise on patient continuing this medication. ?

## 2022-03-05 NOTE — Telephone Encounter (Signed)
Left message for patient to callback to see if she has been taking the Torsemide. ?

## 2022-03-11 DIAGNOSIS — Z961 Presence of intraocular lens: Secondary | ICD-10-CM | POA: Diagnosis not present

## 2022-03-11 DIAGNOSIS — E119 Type 2 diabetes mellitus without complications: Secondary | ICD-10-CM | POA: Diagnosis not present

## 2022-03-11 LAB — HM DIABETES EYE EXAM

## 2022-03-11 NOTE — Telephone Encounter (Signed)
Spoke with patient regarding Dr. Elmarie Shiley recommendation. ? ?Per Dr. Acie Fredrickson: ?Lets have her continue Torsemide 40 mg a day ( will omit the PM dosing for now)  ?Please refill  ?Please have her continue her potassium 10 meq a day  ?She needs to avoid any excess salt or salty foods.  ? ?Check a bmp in 2 weeks  ? ?PN  ? ?Torsemide '40mg'$  QD ordered and sent to pharmacy of choice. ? ?BMP ordered and scheduled for 03/25/22. ? ?Patient verbalized understanding. ?

## 2022-03-13 ENCOUNTER — Encounter: Payer: Self-pay | Admitting: Internal Medicine

## 2022-03-14 DIAGNOSIS — E119 Type 2 diabetes mellitus without complications: Secondary | ICD-10-CM | POA: Diagnosis not present

## 2022-03-14 DIAGNOSIS — E538 Deficiency of other specified B group vitamins: Secondary | ICD-10-CM | POA: Diagnosis not present

## 2022-03-14 DIAGNOSIS — C50912 Malignant neoplasm of unspecified site of left female breast: Secondary | ICD-10-CM | POA: Diagnosis not present

## 2022-03-14 DIAGNOSIS — D0512 Intraductal carcinoma in situ of left breast: Secondary | ICD-10-CM | POA: Diagnosis not present

## 2022-03-14 DIAGNOSIS — N189 Chronic kidney disease, unspecified: Secondary | ICD-10-CM | POA: Diagnosis not present

## 2022-03-14 DIAGNOSIS — D696 Thrombocytopenia, unspecified: Secondary | ICD-10-CM | POA: Diagnosis not present

## 2022-03-14 DIAGNOSIS — R0600 Dyspnea, unspecified: Secondary | ICD-10-CM | POA: Diagnosis not present

## 2022-03-14 DIAGNOSIS — I129 Hypertensive chronic kidney disease with stage 1 through stage 4 chronic kidney disease, or unspecified chronic kidney disease: Secondary | ICD-10-CM | POA: Diagnosis not present

## 2022-03-14 DIAGNOSIS — M545 Low back pain, unspecified: Secondary | ICD-10-CM | POA: Diagnosis not present

## 2022-03-14 DIAGNOSIS — I10 Essential (primary) hypertension: Secondary | ICD-10-CM | POA: Diagnosis not present

## 2022-03-14 DIAGNOSIS — N179 Acute kidney failure, unspecified: Secondary | ICD-10-CM | POA: Diagnosis not present

## 2022-03-14 DIAGNOSIS — I480 Paroxysmal atrial fibrillation: Secondary | ICD-10-CM | POA: Diagnosis not present

## 2022-03-14 DIAGNOSIS — E1122 Type 2 diabetes mellitus with diabetic chronic kidney disease: Secondary | ICD-10-CM | POA: Diagnosis not present

## 2022-03-14 DIAGNOSIS — D649 Anemia, unspecified: Secondary | ICD-10-CM | POA: Diagnosis not present

## 2022-03-14 DIAGNOSIS — C9200 Acute myeloblastic leukemia, not having achieved remission: Secondary | ICD-10-CM | POA: Diagnosis not present

## 2022-03-14 DIAGNOSIS — C9201 Acute myeloblastic leukemia, in remission: Secondary | ICD-10-CM | POA: Diagnosis not present

## 2022-03-18 ENCOUNTER — Telehealth: Payer: Self-pay | Admitting: *Deleted

## 2022-03-18 NOTE — Telephone Encounter (Signed)
Ms. Cotrell called office asking to schedule MD visit. Made her aware of CT scan on 5/12 and prep. She will have a family member pick up her oral contrast. Informed her she is scheduled for July to see Dr. Drake Leach was not aware. Printed schedules for May-July and mailed to her home per request. Informed her we will call her with CT results and can adjust MD visit if needed at that time. ? ?

## 2022-03-19 ENCOUNTER — Other Ambulatory Visit: Payer: Self-pay | Admitting: Internal Medicine

## 2022-03-19 DIAGNOSIS — Z9889 Other specified postprocedural states: Secondary | ICD-10-CM

## 2022-03-21 ENCOUNTER — Encounter: Payer: Self-pay | Admitting: Oncology

## 2022-03-21 ENCOUNTER — Ambulatory Visit
Admission: RE | Admit: 2022-03-21 | Discharge: 2022-03-21 | Disposition: A | Discharge status: Home / Self Care | Payer: Medicare HMO | Source: Ambulatory Visit | Attending: General Surgery | Admitting: General Surgery

## 2022-03-21 DIAGNOSIS — R928 Other abnormal and inconclusive findings on diagnostic imaging of breast: Secondary | ICD-10-CM | POA: Diagnosis not present

## 2022-03-21 DIAGNOSIS — Z9889 Other specified postprocedural states: Secondary | ICD-10-CM

## 2022-03-24 ENCOUNTER — Telehealth: Payer: Self-pay

## 2022-03-24 MED ORDER — TORSEMIDE 20 MG PO TABS: 20.0000 mg | ORAL_TABLET | Freq: Every day | ORAL | Status: DC

## 2022-03-24 NOTE — Telephone Encounter (Signed)
Spoke with patient to discuss change in Torsemide per lab results received from [facility] office. Creatinine 1.94 on 03/14/22. ? ?Per Dr. Acie Fredrickson, patient to decrease Torsemide to '20mg'$  once daily. Recheck BMP in 1 month. ? ?Lab appt scheduled for 04/23/22. ? ?Patient verbalized understanding of the above changes and expressed appreciation for call. ?

## 2022-03-25 ENCOUNTER — Other Ambulatory Visit: Payer: Medicare HMO

## 2022-03-25 ENCOUNTER — Telehealth: Payer: Self-pay

## 2022-03-25 NOTE — Telephone Encounter (Signed)
Patient called in states she has an appointment of 04/18/22 and she needs the contrast to drink for her appointment. Called and speak with the patient to let her know the CT scan is with out contrast. Patient gave verbal understanding and had no further questions or concerns. ?

## 2022-03-28 ENCOUNTER — Other Ambulatory Visit: Payer: Self-pay | Admitting: Nurse Practitioner

## 2022-04-03 ENCOUNTER — Telehealth: Payer: Self-pay | Admitting: Neurology

## 2022-04-03 NOTE — Telephone Encounter (Signed)
Attempted to call pt, pt has refills on file  ?

## 2022-04-03 NOTE — Telephone Encounter (Signed)
Pt is requesting a refill for levETIRAcetam (KEPPRA) 500 MG tablet . ? ?Pharmacy: [facility]  ? ?

## 2022-04-16 ENCOUNTER — Ambulatory Visit (INDEPENDENT_AMBULATORY_CARE_PROVIDER_SITE_OTHER): Payer: Medicare HMO | Admitting: Internal Medicine

## 2022-04-16 ENCOUNTER — Encounter: Payer: Self-pay | Admitting: Internal Medicine

## 2022-04-16 ENCOUNTER — Emergency Department (HOSPITAL_COMMUNITY): Payer: Medicare HMO

## 2022-04-16 ENCOUNTER — Encounter (HOSPITAL_COMMUNITY): Payer: Self-pay | Admitting: Emergency Medicine

## 2022-04-16 ENCOUNTER — Observation Stay (HOSPITAL_COMMUNITY)
Admission: EM | Admit: 2022-04-16 | Discharge: 2022-04-21 | Disposition: A | Discharge status: Home / Self Care | Payer: Medicare HMO | Attending: Internal Medicine | Admitting: Internal Medicine

## 2022-04-16 VITALS — BP 150/70 | HR 75 | Temp 98.1°F | Ht 62.0 in | Wt 163.0 lb

## 2022-04-16 DIAGNOSIS — R569 Unspecified convulsions: Secondary | ICD-10-CM

## 2022-04-16 DIAGNOSIS — Z853 Personal history of malignant neoplasm of breast: Secondary | ICD-10-CM | POA: Insufficient documentation

## 2022-04-16 DIAGNOSIS — R600 Localized edema: Secondary | ICD-10-CM | POA: Insufficient documentation

## 2022-04-16 DIAGNOSIS — K7460 Unspecified cirrhosis of liver: Secondary | ICD-10-CM | POA: Insufficient documentation

## 2022-04-16 DIAGNOSIS — I358 Other nonrheumatic aortic valve disorders: Secondary | ICD-10-CM | POA: Insufficient documentation

## 2022-04-16 DIAGNOSIS — R7989 Other specified abnormal findings of blood chemistry: Secondary | ICD-10-CM | POA: Insufficient documentation

## 2022-04-16 DIAGNOSIS — I4892 Unspecified atrial flutter: Secondary | ICD-10-CM | POA: Insufficient documentation

## 2022-04-16 DIAGNOSIS — J449 Chronic obstructive pulmonary disease, unspecified: Secondary | ICD-10-CM | POA: Insufficient documentation

## 2022-04-16 DIAGNOSIS — I5033 Acute on chronic diastolic (congestive) heart failure: Secondary | ICD-10-CM | POA: Diagnosis not present

## 2022-04-16 DIAGNOSIS — R195 Other fecal abnormalities: Secondary | ICD-10-CM | POA: Insufficient documentation

## 2022-04-16 DIAGNOSIS — I350 Nonrheumatic aortic (valve) stenosis: Secondary | ICD-10-CM | POA: Insufficient documentation

## 2022-04-16 DIAGNOSIS — D320 Benign neoplasm of cerebral meninges: Secondary | ICD-10-CM | POA: Insufficient documentation

## 2022-04-16 DIAGNOSIS — Z794 Long term (current) use of insulin: Secondary | ICD-10-CM

## 2022-04-16 DIAGNOSIS — G8929 Other chronic pain: Secondary | ICD-10-CM

## 2022-04-16 DIAGNOSIS — Z170 Estrogen receptor positive status [ER+]: Secondary | ICD-10-CM

## 2022-04-16 DIAGNOSIS — E8809 Other disorders of plasma-protein metabolism, not elsewhere classified: Secondary | ICD-10-CM | POA: Insufficient documentation

## 2022-04-16 DIAGNOSIS — R1084 Generalized abdominal pain: Secondary | ICD-10-CM

## 2022-04-16 DIAGNOSIS — M542 Cervicalgia: Secondary | ICD-10-CM | POA: Insufficient documentation

## 2022-04-16 DIAGNOSIS — R2681 Unsteadiness on feet: Secondary | ICD-10-CM | POA: Diagnosis not present

## 2022-04-16 DIAGNOSIS — C7951 Secondary malignant neoplasm of bone: Secondary | ICD-10-CM | POA: Insufficient documentation

## 2022-04-16 DIAGNOSIS — Z79899 Other long term (current) drug therapy: Secondary | ICD-10-CM | POA: Insufficient documentation

## 2022-04-16 DIAGNOSIS — R188 Other ascites: Secondary | ICD-10-CM | POA: Diagnosis not present

## 2022-04-16 DIAGNOSIS — G40209 Localization-related (focal) (partial) symptomatic epilepsy and epileptic syndromes with complex partial seizures, not intractable, without status epilepticus: Secondary | ICD-10-CM | POA: Diagnosis present

## 2022-04-16 DIAGNOSIS — M549 Dorsalgia, unspecified: Secondary | ICD-10-CM | POA: Diagnosis not present

## 2022-04-16 DIAGNOSIS — Z8542 Personal history of malignant neoplasm of other parts of uterus: Secondary | ICD-10-CM | POA: Insufficient documentation

## 2022-04-16 DIAGNOSIS — Z955 Presence of coronary angioplasty implant and graft: Secondary | ICD-10-CM | POA: Insufficient documentation

## 2022-04-16 DIAGNOSIS — Z7901 Long term (current) use of anticoagulants: Secondary | ICD-10-CM | POA: Diagnosis not present

## 2022-04-16 DIAGNOSIS — Z801 Family history of malignant neoplasm of trachea, bronchus and lung: Secondary | ICD-10-CM | POA: Insufficient documentation

## 2022-04-16 DIAGNOSIS — M6281 Muscle weakness (generalized): Secondary | ICD-10-CM | POA: Insufficient documentation

## 2022-04-16 DIAGNOSIS — E785 Hyperlipidemia, unspecified: Secondary | ICD-10-CM | POA: Diagnosis present

## 2022-04-16 DIAGNOSIS — Z7902 Long term (current) use of antithrombotics/antiplatelets: Secondary | ICD-10-CM | POA: Insufficient documentation

## 2022-04-16 DIAGNOSIS — D638 Anemia in other chronic diseases classified elsewhere: Secondary | ICD-10-CM | POA: Insufficient documentation

## 2022-04-16 DIAGNOSIS — Z87891 Personal history of nicotine dependence: Secondary | ICD-10-CM | POA: Diagnosis not present

## 2022-04-16 DIAGNOSIS — M7989 Other specified soft tissue disorders: Secondary | ICD-10-CM | POA: Insufficient documentation

## 2022-04-16 DIAGNOSIS — M545 Low back pain, unspecified: Secondary | ICD-10-CM | POA: Insufficient documentation

## 2022-04-16 DIAGNOSIS — N1832 Chronic kidney disease, stage 3b: Secondary | ICD-10-CM

## 2022-04-16 DIAGNOSIS — Z66 Do not resuscitate: Secondary | ICD-10-CM | POA: Diagnosis not present

## 2022-04-16 DIAGNOSIS — N183 Chronic kidney disease, stage 3 unspecified: Secondary | ICD-10-CM

## 2022-04-16 DIAGNOSIS — I2510 Atherosclerotic heart disease of native coronary artery without angina pectoris: Secondary | ICD-10-CM

## 2022-04-16 DIAGNOSIS — I700 Atherosclerosis of aorta: Secondary | ICD-10-CM | POA: Diagnosis not present

## 2022-04-16 DIAGNOSIS — I509 Heart failure, unspecified: Secondary | ICD-10-CM | POA: Diagnosis not present

## 2022-04-16 DIAGNOSIS — E119 Type 2 diabetes mellitus without complications: Secondary | ICD-10-CM

## 2022-04-16 DIAGNOSIS — R9431 Abnormal electrocardiogram [ECG] [EKG]: Secondary | ICD-10-CM | POA: Insufficient documentation

## 2022-04-16 DIAGNOSIS — R000 Tachycardia, unspecified: Secondary | ICD-10-CM | POA: Diagnosis not present

## 2022-04-16 DIAGNOSIS — K8689 Other specified diseases of pancreas: Secondary | ICD-10-CM | POA: Insufficient documentation

## 2022-04-16 DIAGNOSIS — Z7984 Long term (current) use of oral hypoglycemic drugs: Secondary | ICD-10-CM | POA: Insufficient documentation

## 2022-04-16 DIAGNOSIS — Z8619 Personal history of other infectious and parasitic diseases: Secondary | ICD-10-CM | POA: Insufficient documentation

## 2022-04-16 DIAGNOSIS — I252 Old myocardial infarction: Secondary | ICD-10-CM | POA: Insufficient documentation

## 2022-04-16 DIAGNOSIS — J90 Pleural effusion, not elsewhere classified: Secondary | ICD-10-CM | POA: Insufficient documentation

## 2022-04-16 DIAGNOSIS — C9200 Acute myeloblastic leukemia, not having achieved remission: Secondary | ICD-10-CM | POA: Diagnosis present

## 2022-04-16 DIAGNOSIS — R0600 Dyspnea, unspecified: Secondary | ICD-10-CM | POA: Insufficient documentation

## 2022-04-16 DIAGNOSIS — Z8249 Family history of ischemic heart disease and other diseases of the circulatory system: Secondary | ICD-10-CM | POA: Insufficient documentation

## 2022-04-16 DIAGNOSIS — Z79811 Long term (current) use of aromatase inhibitors: Secondary | ICD-10-CM | POA: Insufficient documentation

## 2022-04-16 DIAGNOSIS — R2689 Other abnormalities of gait and mobility: Secondary | ICD-10-CM | POA: Insufficient documentation

## 2022-04-16 DIAGNOSIS — I493 Ventricular premature depolarization: Secondary | ICD-10-CM | POA: Diagnosis not present

## 2022-04-16 DIAGNOSIS — Z8719 Personal history of other diseases of the digestive system: Secondary | ICD-10-CM | POA: Insufficient documentation

## 2022-04-16 DIAGNOSIS — I480 Paroxysmal atrial fibrillation: Secondary | ICD-10-CM | POA: Diagnosis not present

## 2022-04-16 DIAGNOSIS — R109 Unspecified abdominal pain: Secondary | ICD-10-CM | POA: Diagnosis present

## 2022-04-16 DIAGNOSIS — K862 Cyst of pancreas: Secondary | ICD-10-CM | POA: Insufficient documentation

## 2022-04-16 DIAGNOSIS — I440 Atrioventricular block, first degree: Secondary | ICD-10-CM | POA: Insufficient documentation

## 2022-04-16 DIAGNOSIS — Z7951 Long term (current) use of inhaled steroids: Secondary | ICD-10-CM | POA: Insufficient documentation

## 2022-04-16 DIAGNOSIS — F419 Anxiety disorder, unspecified: Secondary | ICD-10-CM | POA: Diagnosis not present

## 2022-04-16 DIAGNOSIS — E1122 Type 2 diabetes mellitus with diabetic chronic kidney disease: Secondary | ICD-10-CM | POA: Diagnosis not present

## 2022-04-16 DIAGNOSIS — I130 Hypertensive heart and chronic kidney disease with heart failure and stage 1 through stage 4 chronic kidney disease, or unspecified chronic kidney disease: Secondary | ICD-10-CM | POA: Insufficient documentation

## 2022-04-16 DIAGNOSIS — I5043 Acute on chronic combined systolic (congestive) and diastolic (congestive) heart failure: Secondary | ICD-10-CM | POA: Diagnosis not present

## 2022-04-16 DIAGNOSIS — C9201 Acute myeloblastic leukemia, in remission: Secondary | ICD-10-CM | POA: Insufficient documentation

## 2022-04-16 DIAGNOSIS — Z803 Family history of malignant neoplasm of breast: Secondary | ICD-10-CM | POA: Insufficient documentation

## 2022-04-16 DIAGNOSIS — Z79891 Long term (current) use of opiate analgesic: Secondary | ICD-10-CM | POA: Insufficient documentation

## 2022-04-16 DIAGNOSIS — J9811 Atelectasis: Secondary | ICD-10-CM | POA: Insufficient documentation

## 2022-04-16 DIAGNOSIS — R0609 Other forms of dyspnea: Secondary | ICD-10-CM | POA: Insufficient documentation

## 2022-04-16 DIAGNOSIS — R911 Solitary pulmonary nodule: Secondary | ICD-10-CM | POA: Diagnosis present

## 2022-04-16 DIAGNOSIS — C50412 Malignant neoplasm of upper-outer quadrant of left female breast: Secondary | ICD-10-CM

## 2022-04-16 DIAGNOSIS — R0602 Shortness of breath: Secondary | ICD-10-CM | POA: Diagnosis not present

## 2022-04-16 DIAGNOSIS — I3139 Other pericardial effusion (noninflammatory): Secondary | ICD-10-CM | POA: Insufficient documentation

## 2022-04-16 DIAGNOSIS — E8770 Fluid overload, unspecified: Secondary | ICD-10-CM | POA: Insufficient documentation

## 2022-04-16 DIAGNOSIS — I110 Hypertensive heart disease with heart failure: Secondary | ICD-10-CM | POA: Diagnosis not present

## 2022-04-16 DIAGNOSIS — K6389 Other specified diseases of intestine: Secondary | ICD-10-CM | POA: Diagnosis not present

## 2022-04-16 DIAGNOSIS — I10 Essential (primary) hypertension: Secondary | ICD-10-CM

## 2022-04-16 DIAGNOSIS — I429 Cardiomyopathy, unspecified: Secondary | ICD-10-CM | POA: Insufficient documentation

## 2022-04-16 DIAGNOSIS — Z862 Personal history of diseases of the blood and blood-forming organs and certain disorders involving the immune mechanism: Secondary | ICD-10-CM | POA: Insufficient documentation

## 2022-04-16 LAB — COMPREHENSIVE METABOLIC PANEL
ALT: 40 U/L (ref 0–44)
AST: 61 U/L — ABNORMAL HIGH (ref 15–41)
Albumin: 2.9 g/dL — ABNORMAL LOW (ref 3.5–5.0)
Alkaline Phosphatase: 175 U/L — ABNORMAL HIGH (ref 38–126)
Anion gap: 5 (ref 5–15)
BUN: 13 mg/dL (ref 8–23)
CO2: 25 mmol/L (ref 22–32)
Calcium: 7.9 mg/dL — ABNORMAL LOW (ref 8.9–10.3)
Chloride: 113 mmol/L — ABNORMAL HIGH (ref 98–111)
Creatinine, Ser: 1.63 mg/dL — ABNORMAL HIGH (ref 0.44–1.00)
GFR, Estimated: 32 mL/min — ABNORMAL LOW (ref >60)
Glucose, Bld: 256 mg/dL — ABNORMAL HIGH (ref 70–99)
Potassium: 3.9 mmol/L (ref 3.5–5.1)
Sodium: 143 mmol/L (ref 135–145)
Total Bilirubin: 0.7 mg/dL (ref 0.3–1.2)
Total Protein: 5.8 g/dL — ABNORMAL LOW (ref 6.5–8.1)

## 2022-04-16 LAB — CBC WITH DIFFERENTIAL/PLATELET
Abs Immature Granulocytes: 0.02 10*3/uL (ref 0.00–0.07)
Basophils Absolute: 0 10*3/uL (ref 0.0–0.1)
Basophils Relative: 0 %
Eosinophils Absolute: 0.1 10*3/uL (ref 0.0–0.5)
Eosinophils Relative: 1 %
HCT: 35.1 % — ABNORMAL LOW (ref 36.0–46.0)
Hemoglobin: 11.6 g/dL — ABNORMAL LOW (ref 12.0–15.0)
Immature Granulocytes: 0 %
Lymphocytes Relative: 24 %
Lymphs Abs: 1.4 10*3/uL (ref 0.7–4.0)
MCH: 31.1 pg (ref 26.0–34.0)
MCHC: 33 g/dL (ref 30.0–36.0)
MCV: 94.1 fL (ref 80.0–100.0)
Monocytes Absolute: 0.4 10*3/uL (ref 0.1–1.0)
Monocytes Relative: 8 %
Neutro Abs: 3.8 10*3/uL (ref 1.7–7.7)
Neutrophils Relative %: 67 %
Platelets: 132 10*3/uL — ABNORMAL LOW (ref 150–400)
RBC: 3.73 MIL/uL — ABNORMAL LOW (ref 3.87–5.11)
RDW: 13.3 % (ref 11.5–15.5)
WBC: 5.7 10*3/uL (ref 4.0–10.5)
nRBC: 0 % (ref 0.0–0.2)

## 2022-04-16 LAB — MAGNESIUM: Magnesium: 1.9 mg/dL (ref 1.7–2.4)

## 2022-04-16 LAB — POCT GLYCOSYLATED HEMOGLOBIN (HGB A1C): Hemoglobin A1C: 8 % — AB (ref 4.0–5.6)

## 2022-04-16 LAB — BRAIN NATRIURETIC PEPTIDE: B Natriuretic Peptide: 549.2 pg/mL — ABNORMAL HIGH (ref 0.0–100.0)

## 2022-04-16 MED ORDER — HYDROCODONE-ACETAMINOPHEN 5-325 MG PO TABS: 1.0000 | ORAL_TABLET | Freq: Four times a day (QID) | ORAL | 0 refills | Status: DC | PRN

## 2022-04-16 MED ORDER — LORAZEPAM 0.5 MG PO TABS: ORAL_TABLET | ORAL | 2 refills | Status: DC

## 2022-04-16 MED ORDER — TRUE METRIX GO GLUCOSE METER W/DEVICE KIT: PACK | 0 refills | Status: DC

## 2022-04-16 NOTE — ED Provider Triage Note (Signed)
Emergency Medicine Provider Triage Evaluation Note ? ?[name] , a 81 y.o. female  was evaluated in triage.  Pt complains of shortness of breath, orthopnea, progressively worsening peripheral edema.  She also endorses neck pain as of today.  No known injury.  Reports compliance with her medication.  She was seen by her PCP today who encouraged her to double her torsemide dose.  ? ?Review of Systems  ?Positive: As above ?Negative: As above ? ?Physical Exam  ?BP (!) 169/107   Pulse 97   Temp 99.1 ?F (37.3 ?C) (Oral)   Resp 18   SpO2 98%  ?Gen:   Awake, no distress   ?Resp:  Normal effort  ?MSK:   Moves extremities without difficulty  ?Other:  Bilateral pitting edema ? ?Medical Decision Making  ?Medically screening exam initiated at 8:18 PM.  Appropriate orders placed.  SAMYAH BILBO was informed that the remainder of the evaluation will be completed by another provider, this initial triage assessment does not replace that evaluation, and the importance of remaining in the ED until their evaluation is complete. ? ? ?  ?Evlyn Courier, PA-C ?04/16/22 2019 ? ?

## 2022-04-16 NOTE — Progress Notes (Signed)
? ? ? ?Established Patient Office Visit ? ? ? ? ?CC/Reason for Visit: Follow-up chronic medical conditions ? ?HPI: [name] is a 81 y.o. female who is coming in today for the above mentioned reasons.  She has a complex past medical history.  At this point I see her mainly for hypertension.  She also has a Teacher, adult education with me for hydrocodone for chronic back pain and lorazepam for her severe anxiety.  She is requesting refills today.  She is supposed to be established with an endocrinologist for her labile diabetes but it has been sometime since she has seen them.  I last saw her for hospital follow-up in January.  She had just been diagnosed with seizures.  She has not been taking her Keppra for reasons that are unknown.  She is complaining of significant shortness of breath.  She cannot speak in full sentences.  She has significant lower extremity edema.  She states that has been ongoing for about a month.  She has an appointment scheduled with her cardiologist for May 17. ? ?Past Medical/Surgical History: ?Past Medical History:  ?Diagnosis Date  ? acute myeloblastic leukemia (AML) dx'd 06/2014  ? Acute myelogenous leukemia ([place]) 02/2014  ? Acute pancreatitis   ? Arthritis   ? "back" (09/17/2017)  ? Atrial flutter ([place])   ? Cardiomyopathy ([place])   ? Chronic kidney disease, stage II (mild)   ? Chronic lower back pain   ? Colon polyps 04/29/2010  ? TUBULAR ADENOMA AND A SERRATED ADENOMA  ? COPD (chronic obstructive pulmonary disease) ([place])   ? CORONARY ARTERY DISEASE 12/24/2007  ? DIABETES MELLITUS, TYPE II 07/13/2007  ? Family history of cervical cancer   ? Family history of esophageal cancer   ? Family history of leukemia   ? Family history of lung cancer   ? Family history of skin cancer   ? History of blood transfusion 2015  ? "related to leukemia"  ? History of kidney stones 12/24/2007  ? History of uterine cancer   ? HYPERLIPIDEMIA 12/24/2007  ? HYPERTENSION 07/13/2007  ? NSTEMI (non-ST elevated myocardial  infarction) ([place]) 09/17/2017  ? Unspecified disease of pancreas   ? Uterine cancer ([place])   ? ? ?Past Surgical History:  ?Procedure Laterality Date  ? ABDOMINAL HYSTERECTOMY    ? "still have my ovaries"  ? BREAST BIOPSY Left 09/22/2019  ? BREAST LUMPECTOMY Left 10/24/2019  ? BREAST LUMPECTOMY WITH RADIOACTIVE SEED LOCALIZATION Left 10/24/2019  ? Procedure: LEFT BREAST LUMPECTOMY WITH RADIOACTIVE SEED LOCALIZATION;  Surgeon: Jovita Kussmaul, MD;  Location: [facility];  Service: General;  Laterality: Left;  ? CARDIAC CATHETERIZATION  2002  ? CARDIAC CATHETERIZATION  09/17/2017  ? CATARACT EXTRACTION W/ INTRAOCULAR LENS  IMPLANT, BILATERAL Bilateral   ? COLONOSCOPY W/ BIOPSIES AND POLYPECTOMY  2011  ? CORONARY STENT INTERVENTION N/A 09/21/2017  ? Procedure: CORONARY STENT INTERVENTION;  Surgeon: Troy Sine, MD;  Location: [facility];  Service: Cardiovascular;  Laterality: N/A;  ? DILATION AND CURETTAGE OF UTERUS    ? ESOPHAGOGASTRODUODENOSCOPY N/A 01/30/2022  ? Procedure: ESOPHAGOGASTRODUODENOSCOPY (EGD);  Surgeon: Milus Banister, MD;  Location: [facility];  Service: Endoscopy;  Laterality: N/A;  ? EUS N/A 01/30/2022  ? Procedure: UPPER ENDOSCOPIC ULTRASOUND (EUS) RADIAL;  Surgeon: Milus Banister, MD;  Location: [facility];  Service: Endoscopy;  Laterality: N/A;  ? EXCISIONAL HEMORRHOIDECTOMY  X 2  ? FINE NEEDLE ASPIRATION N/A 01/30/2022  ? Procedure: FINE NEEDLE ASPIRATION (  FNA) LINEAR;  Surgeon: Milus Banister, MD;  Location: [facility];  Service: Endoscopy;  Laterality: N/A;  ? GANGLION CYST EXCISION Left X 3  ? INTRAVASCULAR PRESSURE WIRE/FFR STUDY N/A 09/17/2017  ? Procedure: INTRAVASCULAR PRESSURE WIRE/FFR STUDY;  Surgeon: Nelva Bush, MD;  Location: [facility];  Service: Cardiovascular;  Laterality: N/A;  ? NASAL SINUS SURGERY    ? PORTA CATH INSERTION Right 2013  ? RIGHT/LEFT HEART CATH AND CORONARY ANGIOGRAPHY N/A 09/17/2017  ? Procedure: RIGHT/LEFT HEART CATH  AND CORONARY ANGIOGRAPHY;  Surgeon: Nelva Bush, MD;  Location: [facility];  Service: Cardiovascular;  Laterality: N/A;  ? TUBAL LIGATION    ? ? ?Social History: ? reports that she quit smoking about 21 years ago. Her smoking use included cigarettes. She has a 20.00 pack-year smoking history. She has never used smokeless tobacco. She reports that she does not drink alcohol and does not use drugs. ? ?Allergies: ?No Known Allergies ? ?Family History:  ?Family History  ?Problem Relation Age of Onset  ? COPD Father   ? Esophageal cancer Father   ? Breast cancer Sister 38  ? Lung cancer Sister   ? Esophageal cancer Brother   ? Suicidality Brother   ? Lung cancer Sister   ? Lung cancer Sister   ? Cervical cancer Sister   ? Congestive Heart Failure Mother   ? Heart attack Maternal Grandmother   ? Skin cancer Maternal Grandfather   ? Heart attack Maternal Grandfather   ? Stroke Paternal Grandmother 51  ? Heart attack Paternal Grandfather   ? Cancer Maternal Uncle   ?     unknown type, dx. >65  ? Cancer Paternal Aunt   ?     unknown type, dx. >50  ? Cancer Paternal Uncle   ?     unknown type, dx. >50  ? Cirrhosis Maternal Uncle   ? Skin cancer Daughter   ? Leukemia Cousin   ?     maternal first cousin; dx. >50, in remission  ? Cancer Niece   ?     unknown type behind her ear, dx. 30s/40s  ? Colon cancer Neg Hx   ? ? ? ?Current Outpatient Medications:  ?  amLODipine (NORVASC) 10 MG tablet, TAKE 1 TABLET EVERY DAY, Disp: 90 tablet, Rfl: 1 ?  atorvastatin (LIPITOR) 40 MG tablet, TAKE 1 TABLET EVERY DAY, Disp: 90 tablet, Rfl: 1 ?  clopidogrel (PLAVIX) 75 MG tablet, TAKE 1 TABLET EVERY EVENING, Disp: 90 tablet, Rfl: 1 ?  Continuous Blood Gluc Sensor (DEXCOM G6 SENSOR) MISC, 1 Device by Does not apply route as directed., Disp: 9 each, Rfl: 3 ?  Continuous Blood Gluc Transmit (DEXCOM G6 TRANSMITTER) MISC, 1 Device by Does not apply route as directed., Disp: 1 each, Rfl: 3 ?  cyanocobalamin (,VITAMIN B-12,) 1000 MCG/ML  injection, INJECT 1ML IN THE MUSCLE EVERY 30 DAYS AS DIRECTED, Disp: 3 mL, Rfl: 3 ?  fluticasone furoate-vilanterol (BREO ELLIPTA) 200-25 MCG/ACT AEPB, Inhale 1 puff into the lungs as needed (wheezing SOB)., Disp: , Rfl:  ?  glucose blood (TRUE METRIX BLOOD GLUCOSE TEST) test strip, TEST BLOOD SUGAR THREE TIMES DAILY, Disp: 300 strip, Rfl: 2 ?  HYDROcodone-acetaminophen (NORCO) 5-325 MG tablet, Take 1 tablet by mouth every 6 (six) hours as needed for moderate pain., Disp: 120 tablet, Rfl: 0 ?  HYDROcodone-acetaminophen (NORCO) 5-325 MG tablet, Take 1 tablet by mouth every 6 (six) hours as needed for moderate pain., Disp: 120 tablet,  Rfl: 0 ?  insulin glargine, 2 Unit Dial, (TOUJEO MAX SOLOSTAR) 300 UNIT/ML Solostar Pen, Inject 16 Units into the skin at bedtime., Disp: 15 mL, Rfl: 1 ?  Insulin Pen Needle 32G X 4 MM MISC, 1 Device by Does not apply route in the morning, at noon, in the evening, and at bedtime., Disp: 400 each, Rfl: 3 ?  isosorbide mononitrate (IMDUR) 30 MG 24 hr tablet, TAKE 1 AND 1/2 TABLETS EVERY DAY (Patient taking differently: Take 30 mg by mouth daily. TAKE 1 AND 1/2 TABLETS EVERY DAY), Disp: 135 tablet, Rfl: 1 ?  letrozole (FEMARA) 2.5 MG tablet, Take 1 tablet (2.5 mg total) by mouth daily., Disp: 90 tablet, Rfl: 3 ?  lipase/protease/amylase (CREON) 36000 UNITS CPEP capsule, Take 2 capsules (72,000 Units total) by mouth 3 (three) times daily with meals. (Patient taking differently: Take 72,000 Units by mouth 3 (three) times daily with meals. Pt is taking as needed due to constipation), Disp: 48 capsule, Rfl: 0 ?  MAGNESIUM OXIDE 400 PO, Take 400 mg by mouth 2 (two) times daily., Disp: , Rfl:  ?  metoprolol succinate (TOPROL-XL) 100 MG 24 hr tablet, TAKE 1 TABLET EVERY DAY, Disp: 90 tablet, Rfl: 1 ?  nitroGLYCERIN (NITROSTAT) 0.4 MG SL tablet, PLACE 1 TABLET (0.4 MG TOTAL) UNDER THE TONGUE EVERY 5 (FIVE) MINUTES AS NEEDED FOR CHEST PAIN., Disp: 25 tablet, Rfl: 0 ?  polyethylene glycol (MIRALAX /  GLYCOLAX) 17 g packet, Take 17 g by mouth daily as needed for mild constipation., Disp: , Rfl:  ?  potassium chloride (KLOR-CON) 10 MEQ tablet, TAKE 1 TABLET EVERY DAY, Disp: 90 tablet, Rfl: 1 ?  SYRINGE-N

## 2022-04-16 NOTE — Patient Instructions (Signed)
-  Nice seeing you today!! ? ?-Until you see cardiology, take 2 tablets of torsemide 20 mg instead of 1 tablet. ? ?-If SOB worsens before then, you will need to visit the ED for evaluation. ?

## 2022-04-16 NOTE — ED Triage Notes (Signed)
Pt seen by her physician today and lasix increased to attempt to reduce fluid retention until appt with cardiology next week. Was told if things got worse she should present to the ED.  Pt states she was lying down and was short of breath and saw something on the tv and her husband told her that the tv was not on  Pt states she felt she needed evaluation after that and did not want to wait until late tonight in case she got worse.  ? ?

## 2022-04-17 ENCOUNTER — Observation Stay (HOSPITAL_BASED_OUTPATIENT_CLINIC_OR_DEPARTMENT_OTHER): Payer: Medicare HMO

## 2022-04-17 ENCOUNTER — Encounter (HOSPITAL_COMMUNITY): Payer: Self-pay | Admitting: Internal Medicine

## 2022-04-17 ENCOUNTER — Emergency Department (HOSPITAL_COMMUNITY): Payer: Medicare HMO

## 2022-04-17 ENCOUNTER — Other Ambulatory Visit: Payer: Self-pay

## 2022-04-17 DIAGNOSIS — I5033 Acute on chronic diastolic (congestive) heart failure: Secondary | ICD-10-CM | POA: Diagnosis not present

## 2022-04-17 DIAGNOSIS — I5021 Acute systolic (congestive) heart failure: Secondary | ICD-10-CM

## 2022-04-17 DIAGNOSIS — R0602 Shortness of breath: Secondary | ICD-10-CM | POA: Diagnosis not present

## 2022-04-17 DIAGNOSIS — I700 Atherosclerosis of aorta: Secondary | ICD-10-CM | POA: Diagnosis not present

## 2022-04-17 DIAGNOSIS — R911 Solitary pulmonary nodule: Secondary | ICD-10-CM | POA: Diagnosis present

## 2022-04-17 DIAGNOSIS — M7989 Other specified soft tissue disorders: Secondary | ICD-10-CM

## 2022-04-17 DIAGNOSIS — R7989 Other specified abnormal findings of blood chemistry: Secondary | ICD-10-CM | POA: Diagnosis not present

## 2022-04-17 DIAGNOSIS — K7460 Unspecified cirrhosis of liver: Secondary | ICD-10-CM | POA: Diagnosis not present

## 2022-04-17 DIAGNOSIS — K6389 Other specified diseases of intestine: Secondary | ICD-10-CM | POA: Diagnosis not present

## 2022-04-17 DIAGNOSIS — R188 Other ascites: Secondary | ICD-10-CM | POA: Diagnosis not present

## 2022-04-17 DIAGNOSIS — Z66 Do not resuscitate: Secondary | ICD-10-CM | POA: Diagnosis present

## 2022-04-17 DIAGNOSIS — I480 Paroxysmal atrial fibrillation: Secondary | ICD-10-CM | POA: Diagnosis not present

## 2022-04-17 DIAGNOSIS — R109 Unspecified abdominal pain: Secondary | ICD-10-CM | POA: Diagnosis present

## 2022-04-17 LAB — LIPASE, BLOOD: Lipase: 17 U/L (ref 11–51)

## 2022-04-17 LAB — ECHOCARDIOGRAM COMPLETE
AR max vel: 1.55 cm2
AV Area VTI: 1.28 cm2
AV Area mean vel: 1.45 cm2
AV Mean grad: 17 mmHg
AV Peak grad: 28.1 mmHg
Ao pk vel: 2.65 m/s
Calc EF: 58.5 %
Height: 62 in
S' Lateral: 2.4 cm
Single Plane A2C EF: 52.4 %
Single Plane A4C EF: 65.4 %
Weight: 2608 oz

## 2022-04-17 LAB — C DIFFICILE QUICK SCREEN W PCR REFLEX
C Diff antigen: NEGATIVE
C Diff interpretation: NOT DETECTED
C Diff toxin: NEGATIVE

## 2022-04-17 LAB — TROPONIN I (HIGH SENSITIVITY)
Troponin I (High Sensitivity): 16 ng/L (ref <18)
Troponin I (High Sensitivity): 15 ng/L (ref <18)

## 2022-04-17 LAB — D-DIMER, QUANTITATIVE: D-Dimer, Quant: 2.98 ug/mL-FEU — ABNORMAL HIGH (ref 0.00–0.50)

## 2022-04-17 LAB — GLUCOSE, CAPILLARY
Glucose-Capillary: 307 mg/dL — ABNORMAL HIGH (ref 70–99)
Glucose-Capillary: 217 mg/dL — ABNORMAL HIGH (ref 70–99)
Glucose-Capillary: 130 mg/dL — ABNORMAL HIGH (ref 70–99)

## 2022-04-17 LAB — CBG MONITORING, ED: Glucose-Capillary: 257 mg/dL — ABNORMAL HIGH (ref 70–99)

## 2022-04-17 MED ORDER — LORAZEPAM 0.5 MG PO TABS
0.5000 mg | ORAL_TABLET | Freq: Three times a day (TID) | ORAL | Status: DC | PRN
  Administered 2022-04-18 – 2022-04-21 (×3): 0.5 mg via ORAL
  Filled 2022-04-17 (×3): qty 1

## 2022-04-17 MED ORDER — MAGNESIUM OXIDE -MG SUPPLEMENT 400 (240 MG) MG PO TABS
400.0000 mg | ORAL_TABLET | Freq: Two times a day (BID) | ORAL | Status: DC
  Administered 2022-04-17 – 2022-04-21 (×9): 400 mg via ORAL
  Filled 2022-04-17 (×9): qty 1

## 2022-04-17 MED ORDER — METOPROLOL SUCCINATE ER 100 MG PO TB24
100.0000 mg | ORAL_TABLET | Freq: Every day | ORAL | Status: DC
  Administered 2022-04-17: 100 mg via ORAL
  Filled 2022-04-17: qty 4

## 2022-04-17 MED ORDER — ONDANSETRON HCL 4 MG/2ML IJ SOLN
4.0000 mg | Freq: Four times a day (QID) | INTRAMUSCULAR | Status: DC | PRN
Start: 2022-04-17 — End: 2022-04-21

## 2022-04-17 MED ORDER — FUROSEMIDE 10 MG/ML IJ SOLN
60.0000 mg | Freq: Once | INTRAMUSCULAR | Status: AC
  Administered 2022-04-17: 60 mg via INTRAVENOUS
  Filled 2022-04-17: qty 6

## 2022-04-17 MED ORDER — LEVETIRACETAM 500 MG PO TABS
500.0000 mg | ORAL_TABLET | Freq: Two times a day (BID) | ORAL | Status: DC
Start: 2022-04-17 — End: 2022-04-21
  Administered 2022-04-17 – 2022-04-21 (×9): 500 mg via ORAL
  Filled 2022-04-17 (×9): qty 1

## 2022-04-17 MED ORDER — INSULIN ASPART 100 UNIT/ML IJ SOLN: 0.0000 [IU] | Freq: Every day | INTRAMUSCULAR | Status: DC

## 2022-04-17 MED ORDER — LETROZOLE 2.5 MG PO TABS
2.5000 mg | ORAL_TABLET | Freq: Every day | ORAL | Status: DC
  Administered 2022-04-17 – 2022-04-21 (×5): 2.5 mg via ORAL
  Filled 2022-04-17 (×6): qty 1

## 2022-04-17 MED ORDER — INSULIN GLARGINE-YFGN 100 UNIT/ML ~~LOC~~ SOLN
16.0000 [IU] | Freq: Every day | SUBCUTANEOUS | Status: DC
  Administered 2022-04-17: 16 [IU] via SUBCUTANEOUS
  Filled 2022-04-17 (×2): qty 0.16

## 2022-04-17 MED ORDER — ONDANSETRON HCL 4 MG PO TABS: 4.0000 mg | ORAL_TABLET | Freq: Four times a day (QID) | ORAL | Status: DC | PRN

## 2022-04-17 MED ORDER — CLOPIDOGREL BISULFATE 75 MG PO TABS
75.0000 mg | ORAL_TABLET | Freq: Every evening | ORAL | Status: DC
  Administered 2022-04-17 – 2022-04-20 (×4): 75 mg via ORAL
  Filled 2022-04-17 (×4): qty 1

## 2022-04-17 MED ORDER — FUROSEMIDE 10 MG/ML IJ SOLN: 40.0000 mg | Freq: Once | INTRAMUSCULAR | Status: DC

## 2022-04-17 MED ORDER — ISOSORBIDE MONONITRATE ER 30 MG PO TB24
30.0000 mg | ORAL_TABLET | Freq: Every day | ORAL | Status: DC
  Administered 2022-04-17 – 2022-04-21 (×5): 30 mg via ORAL
  Filled 2022-04-17 (×5): qty 1

## 2022-04-17 MED ORDER — INSULIN ASPART 100 UNIT/ML IJ SOLN
0.0000 [IU] | Freq: Three times a day (TID) | INTRAMUSCULAR | Status: DC
  Administered 2022-04-17: 5 [IU] via SUBCUTANEOUS

## 2022-04-17 MED ORDER — ACETAMINOPHEN 650 MG RE SUPP: 650.0000 mg | Freq: Four times a day (QID) | RECTAL | Status: DC | PRN

## 2022-04-17 MED ORDER — HYDRALAZINE HCL 20 MG/ML IJ SOLN: 5.0000 mg | INTRAMUSCULAR | Status: DC | PRN

## 2022-04-17 MED ORDER — ENSURE ENLIVE PO LIQD
237.0000 mL | Freq: Two times a day (BID) | ORAL | Status: DC
  Administered 2022-04-18 – 2022-04-19 (×3): 237 mL via ORAL

## 2022-04-17 MED ORDER — IOHEXOL 350 MG/ML SOLN
80.0000 mL | Freq: Once | INTRAVENOUS | Status: AC | PRN
  Administered 2022-04-17: 80 mL via INTRAVENOUS

## 2022-04-17 MED ORDER — ACETAMINOPHEN 325 MG PO TABS
650.0000 mg | ORAL_TABLET | Freq: Four times a day (QID) | ORAL | Status: DC | PRN
  Administered 2022-04-19: 650 mg via ORAL
  Filled 2022-04-17 (×2): qty 2

## 2022-04-17 MED ORDER — SODIUM CHLORIDE 0.9% FLUSH
3.0000 mL | Freq: Two times a day (BID) | INTRAVENOUS | Status: DC
  Administered 2022-04-17: 3 mL via INTRAVENOUS

## 2022-04-17 MED ORDER — ENOXAPARIN SODIUM 30 MG/0.3ML IJ SOSY
30.0000 mg | PREFILLED_SYRINGE | INTRAMUSCULAR | Status: DC
  Administered 2022-04-17: 30 mg via SUBCUTANEOUS
  Filled 2022-04-17: qty 0.3

## 2022-04-17 MED ORDER — FUROSEMIDE 10 MG/ML IJ SOLN
40.0000 mg | Freq: Two times a day (BID) | INTRAMUSCULAR | Status: DC
  Administered 2022-04-17 – 2022-04-18 (×3): 40 mg via INTRAVENOUS
  Filled 2022-04-17 (×4): qty 4

## 2022-04-17 MED ORDER — FENTANYL CITRATE PF 50 MCG/ML IJ SOSY
25.0000 ug | PREFILLED_SYRINGE | Freq: Once | INTRAMUSCULAR | Status: AC
Start: 2022-04-17 — End: 2022-04-17
  Administered 2022-04-17: 25 ug via INTRAVENOUS
  Filled 2022-04-17: qty 1

## 2022-04-17 MED ORDER — TRAZODONE HCL 50 MG PO TABS: 25.0000 mg | ORAL_TABLET | Freq: Every evening | ORAL | Status: DC | PRN

## 2022-04-17 MED ORDER — ATORVASTATIN CALCIUM 40 MG PO TABS
40.0000 mg | ORAL_TABLET | Freq: Every day | ORAL | Status: DC
  Administered 2022-04-17 – 2022-04-21 (×5): 40 mg via ORAL
  Filled 2022-04-17 (×5): qty 1

## 2022-04-17 MED ORDER — HYDROCODONE-ACETAMINOPHEN 5-325 MG PO TABS
1.0000 | ORAL_TABLET | Freq: Four times a day (QID) | ORAL | Status: DC | PRN
  Administered 2022-04-17 – 2022-04-20 (×8): 1 via ORAL
  Filled 2022-04-17 (×9): qty 1

## 2022-04-17 NOTE — ED Provider Notes (Signed)
?[facility] ?Provider Note ? ? ?CSN: [id number] ?Arrival date & time: 04/16/22  2005 ? ?  ? ?History ? ?Chief Complaint  ?Patient presents with  ? Shortness of Breath  ? ? ?[name] is a 81 y.o. female. ? ?The history is provided by the patient and medical records.  ?Shortness of Breath ?[name] is a 81 y.o. female who presents to the Emergency Department complaining of ornis of breath.  She presents to the emergency department for evaluation of shortness of breath.  She states that she has experienced bilateral lower extremity edema, right greater than left for about 1 month.  Over the last 2 to 3 days she has experienced increased shortness of breath.  She normally is able to ambulate independently and without difficulty.  She works.  She states that now she cannot walk to the bathroom without feeling short of breath.  No fevers, chest pain, cough, vomiting.  She does have occasional diarrhea.  She did have some left-sided neck pain yesterday, none today.  She saw her cardiologist 1 month ago and had her torsemide decreased.  Today she saw her PCP, who recommended increasing her torsemide.  She did take an extra dose when she got home but still felt short of breath, which prompted her to present to the emergency department for further evaluation.  She is making urine but feels like it is decreased compared to her normal. She also complains of abdominal pain, scheduled for CT scan on Friday.  She is unable to states how long pain has been present.   ? ?She has a history of CHF, coronary artery disease, stage III CKD, acute myelogenous leukemia, A-fib, breast cancer ?  ? ?Home Medications ?Prior to Admission medications   ?Medication Sig Start Date End Date Taking? Authorizing Provider  ?amLODipine (NORVASC) 10 MG tablet TAKE 1 TABLET EVERY DAY ?Patient taking differently: Take 10 mg by mouth daily. 12/30/21  Yes Isaac Bliss, Rayford Halsted, MD  ?atorvastatin (LIPITOR)  40 MG tablet TAKE 1 TABLET EVERY DAY ?Patient taking differently: Take 40 mg by mouth daily. 12/30/21  Yes Erline Hau, MD  ?Blood Glucose Monitoring Suppl (TRUE METRIX GO GLUCOSE METER) w/Device KIT USE TO CHECK BLOOD SUGARS THREE TIMES A DAY AND PRN ?Patient taking differently: 3 (three) times daily. 04/16/22  Yes Isaac Bliss, Rayford Halsted, MD  ?clopidogrel (PLAVIX) 75 MG tablet TAKE 1 TABLET EVERY EVENING ?Patient taking differently: Take 75 mg by mouth every evening. 12/30/21  Yes Isaac Bliss, Rayford Halsted, MD  ?cyanocobalamin (,VITAMIN B-12,) 1000 MCG/ML injection INJECT 1ML IN THE MUSCLE EVERY 30 DAYS AS DIRECTED ?Patient taking differently: Inject 1,000 mcg into the muscle every 30 (thirty) days. 07/11/21  Yes Isaac Bliss, Rayford Halsted, MD  ?fluticasone furoate-vilanterol (BREO ELLIPTA) 200-25 MCG/ACT AEPB Inhale 1 puff into the lungs as needed (wheezing SOB).   Yes [provider]  ?glucose blood (TRUE METRIX BLOOD GLUCOSE TEST) test strip TEST BLOOD SUGAR THREE TIMES DAILY ?Patient taking differently: 3 (three) times daily. 05/23/21  Yes Isaac Bliss, Rayford Halsted, MD  ?HYDROcodone-acetaminophen (NORCO) 5-325 MG tablet Take 1 tablet by mouth every 6 (six) hours as needed for moderate pain. 04/16/22  Yes Erline Hau, MD  ?insulin glargine, 2 Unit Dial, (TOUJEO MAX SOLOSTAR) 300 UNIT/ML Solostar Pen Inject 16 Units into the skin at bedtime. 04/10/21  Yes Shamleffer, Melanie Crazier, MD  ?isosorbide mononitrate (IMDUR) 30 MG 24 hr tablet TAKE 1 AND  1/2 TABLETS EVERY DAY ?Patient taking differently: Take 30 mg by mouth daily. TAKE 1 AND 1/2 TABLETS EVERY DAY 12/30/21  Yes Isaac Bliss, Rayford Halsted, MD  ?letrozole [facility]) 2.5 MG tablet Take 1 tablet (2.5 mg total) by mouth daily. 02/14/22  Yes Owens Shark, NP  ?levETIRAcetam (KEPPRA) 500 MG tablet Take 1 tablet (500 mg total) by mouth 2 (two) times daily. 01/20/22 04/20/22 Yes Alric Ran, MD  ?lipase/protease/amylase (CREON)  36000 UNITS CPEP capsule Take 2 capsules (72,000 Units total) by mouth 3 (three) times daily with meals. ?Patient taking differently: Take 72,000 Units by mouth 3 (three) times daily as needed (loose stools). 12/17/21  Yes Esterwood, Amy S, PA-C  ?LORazepam (ATIVAN) 0.5 MG tablet TAKE 1 TABLET BY MOUTH EVERY 8 HOURS AS NEEDED FOR ANXIETY(DO NOT EXCEED MORE THAN 2 TABS IN 24 HRS) ?Patient taking differently: Take 0.5 mg by mouth every 8 (eight) hours as needed for anxiety. 04/16/22  Yes Erline Hau, MD  ?MAGNESIUM OXIDE 400 PO Take 400 mg by mouth 2 (two) times daily. 06/08/18  Yes [provider]  ?metoprolol succinate (TOPROL-XL) 100 MG 24 hr tablet TAKE 1 TABLET EVERY DAY ?Patient taking differently: Take 100 mg by mouth daily. 12/30/21  Yes Isaac Bliss, Rayford Halsted, MD  ?nitroGLYCERIN (NITROSTAT) 0.4 MG SL tablet PLACE 1 TABLET (0.4 MG TOTAL) UNDER THE TONGUE EVERY 5 (FIVE) MINUTES AS NEEDED FOR CHEST PAIN. 05/23/21  Yes Isaac Bliss, Rayford Halsted, MD  ?polyethylene glycol (MIRALAX / GLYCOLAX) 17 g packet Take 17 g by mouth daily as needed for mild constipation.   Yes [provider]  ?potassium chloride (KLOR-CON) 10 MEQ tablet TAKE 1 TABLET EVERY DAY ?Patient taking differently: Take 10 mEq by mouth daily. 12/30/21  Yes Isaac Bliss, Rayford Halsted, MD  ?Continuous Blood Gluc Sensor (DEXCOM G6 SENSOR) MISC 1 Device by Does not apply route as directed. ?Patient not taking: Reported on 04/17/2022 04/10/21   Shamleffer, Melanie Crazier, MD  ?Continuous Blood Gluc Transmit (DEXCOM G6 TRANSMITTER) MISC 1 Device by Does not apply route as directed. ?Patient not taking: Reported on 04/17/2022 04/10/21   Shamleffer, Melanie Crazier, MD  ?Insulin Pen Needle 32G X 4 MM MISC 1 Device by Does not apply route in the morning, at noon, in the evening, and at bedtime. 04/10/21   Shamleffer, Melanie Crazier, MD  ?SYRINGE-NEEDLE, DISP, 3 ML (BD SAFETYGLIDE SYRINGE/NEEDLE) 25G X 1" 3 ML MISC Use for B12  injections 03/05/22   Isaac Bliss, Rayford Halsted, MD  ?torsemide (DEMADEX) 20 MG tablet Take 1 tablet (20 mg total) by mouth daily. 03/24/22   Nahser, Wonda Cheng, MD  ?   ? ?Allergies    ?Patient has no known allergies.   ? ?Review of Systems   ?Review of Systems  ?Respiratory:  Positive for shortness of breath.   ?All other systems reviewed and are negative. ? ?Physical Exam ?Updated Vital Signs ?BP (!) 152/67   Pulse 70   Temp 99.1 ?F (37.3 ?C) (Oral)   Resp (!) 22   SpO2 96%  ?Physical Exam ?Vitals and nursing note reviewed.  ?Constitutional:   ?   Appearance: She is well-developed.  ?HENT:  ?   Head: Normocephalic and atraumatic.  ?Cardiovascular:  ?   Rate and Rhythm: Tachycardia present. Rhythm irregular.  ?   Heart sounds: No murmur heard. ?Pulmonary:  ?   Effort: Pulmonary effort is normal. No respiratory distress.  ?   Breath sounds: Normal breath sounds.  ?  Abdominal:  ?   Palpations: Abdomen is soft.  ?   Tenderness: There is no abdominal tenderness. There is no guarding or rebound.  ?Musculoskeletal:     ?   General: No tenderness.  ?   Comments: 2+ DP pulses bilaterally.  2+ pitting edema to bilateral lower extremities, right slightly greater than left  ?Skin: ?   General: Skin is warm and dry.  ?Neurological:  ?   Mental Status: She is alert and oriented to person, place, and time.  ?Psychiatric:     ?   Behavior: Behavior normal.  ? ? ?ED Results / Procedures / Treatments   ?Labs ?(all labs ordered are listed, but only abnormal results are displayed) ?Labs Reviewed  ?CBC WITH DIFFERENTIAL/PLATELET - Abnormal; Notable for the following components:  ?    Result Value  ? RBC 3.73 (*)   ? Hemoglobin 11.6 (*)   ? HCT 35.1 (*)   ? Platelets 132 (*)   ? All other components within normal limits  ?COMPREHENSIVE METABOLIC PANEL - Abnormal; Notable for the following components:  ? Chloride 113 (*)   ? Glucose, Bld 256 (*)   ? Creatinine, Ser 1.63 (*)   ? Calcium 7.9 (*)   ? Total Protein 5.8 (*)   ? Albumin 2.9  (*)   ? AST 61 (*)   ? Alkaline Phosphatase 175 (*)   ? GFR, Estimated 32 (*)   ? All other components within normal limits  ?BRAIN NATRIURETIC PEPTIDE - Abnormal; Notable for the following components:  ? B Natr

## 2022-04-17 NOTE — Progress Notes (Signed)
VASCULAR LAB ? ? ? ?Bilateral lower extremity venous duplex has been performed. ? ?See CV proc for preliminary results. ? ? ?Zoraya Fiorenza, RVT ?04/17/2022, 12:41 PM ? ?

## 2022-04-17 NOTE — Progress Notes (Addendum)
HEMATOLOGY-ONCOLOGY PROGRESS NOTE ? 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 ?Status post cycle 3 high-dose araC consolidation 08/22/2014. ?Bone marrow 09/15/2014 showed necrotic marrow with minute focus of myeloid precursors. No evidence of viable acute leukemia. ?2.  History of pancytopenia secondary to chemotherapy.   ?3. C. difficile colitis-06/09/2014 treated with Flagyl   ?4. Diabetes   ?5. renal insufficiency   ?6. history of hypokalemia and hypomagnesemia. ?7. history of atrial fibrillation   ?9. history of coronary artery disease   ?10. Hospitalization with febrile neutropenia/sepsis secondary to Escherichia coli bacteremia 09/02/2014. ?11. Shingles involving Catherine [name] right back October 2015. ?12. Meningioma on brain CT 05/24/2014 and brain MRI 05/24/2014; brain CT 12/05/2021-stable calcified meningioma along Catherine [name] anterior falx; brain MRI 12/06/2021 with meningioma measuring 17 mm.  Seen by neurosurgery, no acute neurosurgical intervention indicated. ?13. Hospitalization 08/24/2017 through 08/28/2017 with acute CHF. ?14.  Admission 11/21/2017 with expressive aphasia, seizure activity noted on EEG, placed on Keppra ?15.  Left breast cancer, T1NX, status post a left lumpectomy 10/24/2019-1.5 cm, grade 2, associated with high-grade DCIS with necrosis, ER 95%, PR 95%, HER-2 negative, KI-6710% ?Letrozole 11/02/2019 ?16.  ER 05/08/2020-dyspnea ?CT chest 05/08/2020-negative for pulmonary embolism, new patchy sclerosis throughout Catherine [name] thoracic skeleton concerning for bone metastases ?MRI thoracic spine 06/08/2020-consistent with successfully treated marrow  replacement process in 2015, current area of concern at T9 could represent a benign vascular lesion ?17.  Admission 12/05/2021 through 12/09/2021 with seizures, hyperglycemia ?18.  CT abdomen/pelvis 12/07/2021-cystic pancreas head lesion, cirrhotic liver, lytic and sclerotic bone lesions.   ?19.  MRI abdomen 12/09/2021-large cystic lesion within Catherine [name] head of Catherine [name] pancreas, cirrhosis, no suspicious bone lesions on upper EUS 01/30/2022-well-circumscribed complex cystic lesion in Catherine [name] head of Catherine [name] pancreas, measures 4.6 cm, appears to contain gelatinous consistency material and clearly communicates with a dilated main pancreatic duct.  Cytology, no malignant cells identified.  Treated with Cipro.  Per GI note repeat CT 3 to 4 months. ?20.  CTA chest and CT abdomen/pelvis 04/18/1999 23-4 mm right lower lobe pulmonary nodule, stable pancreatic head lesion, cirrhotic liver, diffuse lytic and sclerotic lesions in Catherine [name] bones ?21.  Hospital admission 04/16/2022-congestive heart failure ? ?Catherine [name] [name] has been [name] due to CHF.  Catherine [name] [name] has a history of AML which is in remission as well as a history of left breast cancer.  Catherine [name] [name] was recently found to have a pancreatic mass which was biopsied.  No malignant cells were identified.  Repeat imaging was due later this week.  Catherine [name] [name] also has known bone lesions which were reviewed with radiology and thought to be related to her prior treatment for her AML. ? ?Discussed imaging findings with Catherine [name] [name].  Catherine [name] [name] is aware that we will review Catherine [name] scans in detail.  However, her pancreatic lesion appears to be overall stable.  Her bone lesions were thought to be related to her prior treatment for AML.  However, we will review very closely. ? ?Cardiology is following Catherine [name] [name] and adjusting her diuretics.  Overall, Catherine [name] [name] is feeling better at this time. ? ?Recommendations: ?1.  Management of congestive heart failure per cardiology/primary team. ?2.  We will review CT images with close  attention to bone lesions. ?3.  Continue letrozole ?4.  Call oncology as needed, outpatient follow[name] ?5.  Access Port-A-Cath for IV access ? ?Outpatient follow-up will be scheduled at [name]. ? ?Mikey Bussing, DNP, AGPCNP-BC, AOCNP ?Catherine [name] [name] is well-known to me with a remote history of AML and a history of breast cancer diagnosed in 2020.  Catherine [name] [name] was interviewed and examined.  Catherine [name] [name] is currently taking adjuvant letrozole for breast cancer.  Catherine [name] [name] is admitted with a CHF exacerbation. ?Catherine [name] [name] has a history of bone lesions noted on previous imaging studies.  Catherine [name] lesions are felt to be related to sclerosis from previously treated AML.  There is no clinical history to suggest progression of breast cancer or another malignancy.  I reviewed Catherine [name] current CT images.  Catherine [name] bone lesions do not appear different compared remote CTs. Catherine [name] [name] is followed by Dr. Ardis [name] for a pancreas lesion, likely benign. ? ?I recommend Catherine [name] [name] continue letrozole.  Outpatient follow-up [name]. ? ?I was present for greater than 50% of [name]'s visit.  I performed medical decision making. ? ?SUBJECTIVE: Catherine [name] [name] is followed by our office for a history of AML currently in remission and history of left breast DCIS.  Recently noted to have a cystic lesion in Catherine [name] pancreatic head as well as sclerotic bone lesions.  Cytology from [name] cystic pancreatic head lesion was negative for malignancy.  Case previously presented GI tumor conference and recommendation was for repeat CT in 3 to 4 months.  Additionally, imaging was reviewed with radiology who felt bone lesions were likely related to previous AML.  No further work-up was recommended.  Now admitted with CHF exacerbation.  Has been seen by cardiology.  Diuretics are being adjusted.  Catherine [name] [name] had a CTA chest and CT abdomen/pelvis performed on admission which showed no PE but did show a moderate right pleural effusion and small left pleural  effusion, 4 mm right lower lobe pulmonary nodule which was new, complex lesion of Catherine [name] pancreatic head with pancreatic ductal dilatation which is unchanged, cirrhotic liver, diffuse lytic and sclerotic lesions in Catherine [name] bone suspicious for metastasis. ? ?This [name], Catherine [name] [name] reports that Catherine [name] [name] feels well overall.  Breathing is stable.  Still has some lower extremity edema but overall improved.  No pain reported. ? ?Oncology History  ?Carcinoma of upper-outer quadrant of left breast in female, estrogen receptor positive ([place])  ?10/17/2019 Initial Diagnosis  ? Carcinoma of upper-outer quadrant of left breast in female, estrogen receptor positive ([place]) ? ?  ?10/17/2019 Cancer Staging  ? Staging form: Breast, AJCC 8th Edition ?- Clinical stage from 10/17/2019: Stage IA (cT1c, cN0, cM0, G2, ER+, PR+, HER2-) - Signed by Eppie Gibson, MD on 10/17/2019 ? ?  ?12/29/2019 Genetic Testing  ? Negative genetic testing:  No pathogenic variants detected on [name] CustomNext-Cancer+RNAinsight panel. Two variants of uncertain significance were detected - one in Catherine [name] BRIP1 gene called c.1595T>C (p.M532T), and one in Catherine [name] PALB2 gene called c.2881C>A (p.L961M). Catherine [name] report date is 12/29/2019. ? ?C[facility] [name] CustomNext-Cancer+RNAinsight panel offered by [facility] includes sequencing and rearrangement analysis for up to 91 genes, which included Catherine [name] following 47 genes for Catherine [name] [name]:  APC*, ATM*, AXIN2, BARD1, BMPR1A, BRCA1*, BRCA2*, BRIP1*, CDH1*, CDK4, CDKN2A, CHEK2*, DICER1, EPCAM, GREM1, HOXB13, MEN1, MLH1*, MSH2*, MSH3, MSH6*, MUTYH*, NBN, NF1*, NF2, NTHL1, PALB2*, PMS2*, POLD1, POLE, PTEN*, RAD51C*, RAD51D*, RECQL, RET, SDHA, SDHAF2, SDHB, SDHC, SDHD, SMAD4, SMARCA4, STK11, TP53*, TSC1, TSC2, and VHL.  DNA and RNA analyses performed  for * genes.  ?  ? ? ?PHYSICAL EXAMINATION: ? ?Vitals:  ? 04/17/22 1415 04/17/22 1448  ?BP: (!) 184/68 (!) 158/62  ?Pulse: 96 91  ?Resp: 20 20  ?Temp:  98 ?F (36.7 ?C)  ?SpO2: 97% 96%  ? ?Filed Weights  ?  04/17/22 1448  ?Weight: 69 kg  ? ? ?Intake/Output from previous day: ?No intake/output data recorded. ? ?Physical Exam ?Vitals reviewed.  ?Constitutional:   ?   General: Catherine [name] [name] is not in acute distress. ?HENT:  ?   Head: [place]

## 2022-04-17 NOTE — Consult Note (Addendum)
?Cardiology Consultation:  ? ?Patient ID: [name] ?MRN: [id number]; DOB: 15-Oct-1941 ? ?Admit date: 04/16/2022 ?Date of Consult: 04/17/2022 ? ?PCP:  Isaac Bliss, Rayford Halsted, MD ?  ?[facility] Providers ?Cardiologist:  Mertie Moores, MD      ? ? ?Patient Profile:  ? ?[name] is a 81 y.o. female with a hx of CAD 3 vessel disease, NSTEMI, combined systolic and diastolic HF.  CKD, AML, hx atrial flutter, DM-2, chronic back pain, uterine cancer who is being seen 04/17/2022 for the evaluation of CHF at the request of Dr. Lorin Mercy. ? ?History of Present Illness:  ? ?[name] with hx as above and CAD with cath in 2018 with multivessel disease including sequential 40% ostial and 50% distal LMCA stenoses, sequential proximal and mid LAD stenoses of 50-60%, 40% distal LCx lesion, and 80% proximal to mid RCA stenosis. FFR of the LAD is hemodynamically significant (FFR 0.72).  she had consult with Dr. Servando Snare with [facility] and it was felt she was too high risk for CABG.  She then underwent PCI to [facility] with DES Xience.   Plavix continued and ASA stopped.  She has issues with anemia with her AML.  She has CHF on torsemide.  Other hx of seizures in Dec 2022.  ? ?Last echo 2021 with EF 55-60% no RWMA, trivial MR,  ? ?She was seen by her PCP yesterday and dx with HF exacerbation.  Her Cr had increased and MD had decreased her torsemide from 3 tabs to 1 tablet per day.  Yesterday her torsemide was increased.  Instructed to go to ER if increased SOB.  She could hardly go from room to room at home due to significant SOB  ? ?Pt presented to ER last pm after SOB with lying down and possible hallucinations.  Also with abd pain.  She rec'd lasix 60 mg IV  she put out 750 of urine.  She has had 3 BMs.  ? ? ?EKG:  The EKG was personally reviewed and demonstrates:  read as atrial fib but appears SR 1st degree AV block with PACS rt ward axis and non specific ST abnormalities   follow up EKG SR with PACs and 1st degree AV block. Marland Kitchen   ?Telemetry:  Telemetry was personally reviewed and demonstrates:  SR with 1st degree AV block  ? ?Labs on arrival Na 143, K+ 3.9 glucose 256 Cr 1.63 Albumin 2.9  ?BNP 549  ?Hgb 11.6 WBC 5.7 plts 132 ?A1C 8.0 ?Hs troponin 16 & 15  ? ?2V CXR small Rt pl effusion, minimal Rt basilar subsegmental atelectasis. ?CTA of chest with neg PE,  amall pericardial effusion, mod Rt pl effusion and sm. Left pl effusion with atelectasis at lung bases. Possible duodenitis, possible pancreatitis, cirrhotic liver indeterminate hypodensity in the inferior right lobe of the liver and no abnormality seen on more recent MRI.  Mesenteric edema and trace ascites. Diffuse lytic and sclerotic lesions in the bones, suspicious for metastatic disease. ? ? ?BP has been elevated 178/71 to 167/65 ( on arrival in ER 169/107) ?Denies any chest pain.  ? ?Past Medical History:  ?Diagnosis Date  ? Acute myelogenous leukemia ([place]) 02/2014  ? Acute pancreatitis   ? Arthritis   ? "back" (09/17/2017)  ? Atrial flutter ([place])   ? Cardiomyopathy ([place])   ? Chronic kidney disease, stage II (mild)   ? Chronic lower back pain   ? Colon polyps 04/29/2010  ? TUBULAR ADENOMA AND A SERRATED ADENOMA  ?  COPD (chronic obstructive pulmonary disease) ([place])   ? CORONARY ARTERY DISEASE 12/24/2007  ? DIABETES MELLITUS, TYPE II 07/13/2007  ? History of blood transfusion 2015  ? "related to leukemia"  ? History of kidney stones 12/24/2007  ? History of uterine cancer   ? HYPERLIPIDEMIA 12/24/2007  ? HYPERTENSION 07/13/2007  ? NSTEMI (non-ST elevated myocardial infarction) ([place]) 09/17/2017  ? Uterine cancer ([place])   ? ? ?Past Surgical History:  ?Procedure Laterality Date  ? ABDOMINAL HYSTERECTOMY    ? "still have my ovaries"  ? BREAST BIOPSY Left 09/22/2019  ? BREAST LUMPECTOMY Left 10/24/2019  ? BREAST LUMPECTOMY WITH RADIOACTIVE SEED LOCALIZATION Left 10/24/2019  ? Procedure: LEFT BREAST LUMPECTOMY WITH RADIOACTIVE SEED LOCALIZATION;  Surgeon: Jovita Kussmaul, MD;  Location:  [facility];  Service: General;  Laterality: Left;  ? CARDIAC CATHETERIZATION  2002  ? CARDIAC CATHETERIZATION  09/17/2017  ? CATARACT EXTRACTION W/ INTRAOCULAR LENS  IMPLANT, BILATERAL Bilateral   ? COLONOSCOPY W/ BIOPSIES AND POLYPECTOMY  2011  ? CORONARY STENT INTERVENTION N/A 09/21/2017  ? Procedure: CORONARY STENT INTERVENTION;  Surgeon: Troy Sine, MD;  Location: [facility];  Service: Cardiovascular;  Laterality: N/A;  ? DILATION AND CURETTAGE OF UTERUS    ? ESOPHAGOGASTRODUODENOSCOPY N/A 01/30/2022  ? Procedure: ESOPHAGOGASTRODUODENOSCOPY (EGD);  Surgeon: Milus Banister, MD;  Location: [facility];  Service: Endoscopy;  Laterality: N/A;  ? EUS N/A 01/30/2022  ? Procedure: UPPER ENDOSCOPIC ULTRASOUND (EUS) RADIAL;  Surgeon: Milus Banister, MD;  Location: [facility];  Service: Endoscopy;  Laterality: N/A;  ? EXCISIONAL HEMORRHOIDECTOMY  X 2  ? FINE NEEDLE ASPIRATION N/A 01/30/2022  ? Procedure: FINE NEEDLE ASPIRATION (FNA) LINEAR;  Surgeon: Milus Banister, MD;  Location: [facility];  Service: Endoscopy;  Laterality: N/A;  ? GANGLION CYST EXCISION Left X 3  ? INTRAVASCULAR PRESSURE WIRE/FFR STUDY N/A 09/17/2017  ? Procedure: INTRAVASCULAR PRESSURE WIRE/FFR STUDY;  Surgeon: Nelva Bush, MD;  Location: [facility];  Service: Cardiovascular;  Laterality: N/A;  ? NASAL SINUS SURGERY    ? PORTA CATH INSERTION Right 2013  ? RIGHT/LEFT HEART CATH AND CORONARY ANGIOGRAPHY N/A 09/17/2017  ? Procedure: RIGHT/LEFT HEART CATH AND CORONARY ANGIOGRAPHY;  Surgeon: Nelva Bush, MD;  Location: [facility];  Service: Cardiovascular;  Laterality: N/A;  ? TUBAL LIGATION    ?  ? ? ? ?Inpatient Medications: ?Scheduled Meds: ? atorvastatin  40 mg Oral Daily  ? clopidogrel  75 mg Oral QPM  ? enoxaparin (LOVENOX) injection  30 mg Subcutaneous Q24H  ? furosemide  40 mg Intravenous BID  ? insulin aspart  0-15 Units Subcutaneous TID WC  ? insulin aspart  0-5 Units Subcutaneous QHS   ? insulin glargine (2 Unit Dial)  16 Units Subcutaneous QHS  ? isosorbide mononitrate  30 mg Oral Daily  ? letrozole  2.5 mg Oral Daily  ? levETIRAcetam  500 mg Oral BID  ? magnesium oxide  400 mg Oral BID  ? metoprolol succinate  100 mg Oral Daily  ? sodium chloride flush  3 mL Intravenous Q12H  ? ?Continuous Infusions: ? ?PRN Meds: ?acetaminophen **OR** acetaminophen, hydrALAZINE, HYDROcodone-acetaminophen, LORazepam, ondansetron **OR** ondansetron (ZOFRAN) IV, traZODone ? ?Allergies:   No Known Allergies ? ?Social History:   ?Social History  ? ?Socioeconomic History  ? Marital status: Married  ?  Spouse name: Not on file  ? Number of children: Not on file  ? Years of education: Not on file  ?  Highest education level: Not on file  ?Occupational History  ? Occupation: works in a lab  ?  Comment: makes glasses  ?Tobacco Use  ? Smoking status: Former  ?  Packs/day: 1.00  ?  Years: 20.00  ?  Pack years: 20.00  ?  Types: Cigarettes  ?  Quit date: 12/08/2000  ?  Years since quitting: 21.3  ? Smokeless tobacco: Never  ?Vaping Use  ? Vaping Use: Never used  ?Substance and Sexual Activity  ? Alcohol use: No  ?  Alcohol/week: 0.0 standard drinks  ? Drug use: No  ? Sexual activity: Never  ?Other Topics Concern  ? Not on file  ?Social History Narrative  ? 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 ?Social Determinants of Health  ? ?Financial Resource Strain: Not on file  ?Food Insecurity: Not on file  ?Transportation Needs: Not on file  ?Physical Activity: Not on file  ?Stress: Not on file  ?Social Connections: Not on file  ?Intimate Partner Violence: Not on file  ?  ?Family History:   ? ?Family History  ?Problem Relation Age of Onset  ? COPD Father   ? Esophageal cancer Father   ? Breast cancer Sister 47  ? Lung cancer Sister   ? Esophageal cancer Brother   ? Suicidality Brother   ? Lung cancer Sister   ? Lung cancer Sister   ? Cervical cancer Sister   ? Congestive Heart Failure Mother   ? Heart attack Maternal  Grandmother   ? Skin cancer Maternal Grandfather   ? Heart attack Maternal Grandfather   ? Stroke Paternal Grandmother 25  ? Heart attack Paternal Grandfather   ? Cancer Maternal Uncle   ?     unknown type, dx. >6

## 2022-04-17 NOTE — ED Notes (Signed)
Pt with pain and pressure in her abdomen - feels like she needs to use bathroom - output >750 on purewick - pt abdomen firm - Pt has had 3 BMs - MD aware  ?

## 2022-04-17 NOTE — ED Notes (Signed)
PT OFF THE FLOOR '@348'$  ?

## 2022-04-17 NOTE — ED Notes (Signed)
Lab to add-on lipase  

## 2022-04-17 NOTE — ED Notes (Signed)
Pt ambulated with little assistance - Pt O2 stayed at 95% and above while ambulating - Pt denies increasing SOB, dizziness, or weakness while ambulating  ?

## 2022-04-17 NOTE — Progress Notes (Signed)
?  Echocardiogram ?2D Echocardiogram has been performed. ? ?Fidel Levy ?04/17/2022, 2:17 PM ?

## 2022-04-17 NOTE — ED Notes (Signed)
Pt taken to CT.

## 2022-04-17 NOTE — Progress Notes (Signed)
Initial Nutrition Assessment ? ?DOCUMENTATION CODES:  ? ?Not applicable ? ?INTERVENTION:  ?Once diet advances, provide Ensure Enlive po BID, each supplement provides 350 kcal and 20 grams of protein. ? ?NUTRITION DIAGNOSIS:  ? ?Increased nutrient needs related to chronic illness (CHF) as evidenced by estimated needs. ? ?GOAL:  ? ?Patient will meet greater than or equal to 90% of their needs ? ?MONITOR:  ? ?PO intake, Supplement acceptance, Diet advancement, Labs, Weight trends, Skin, I & O's ? ?REASON FOR ASSESSMENT:  ? ?Consult ?Assessment of nutrition requirement/status ? ?ASSESSMENT:  ? ?81 y.o. female with a hx of CAD, NSTEMI, combined systolic and diastolic HF, CKD, AML in remission, hx atrial flutter, DM-2, left breast DCIS presents with shortness of breath. ? ?Pt diuresing. Pt unavailable during attempted time of contact. Pt is currently on a clear liquid diet. Orders in place to advance to a carb modified heart healthy as tolerated. Unable to complete Nutrition-Focused physical exam at this time. RD to order nutritional supplements to aid in caloric and protein needs.  ? ?Labs and medications reviewed.  ? ?Diet Order:   ?Diet Order   ? ?       ?  Diet clear liquid Room service appropriate? Yes; Fluid consistency: Thin; Fluid restriction: 1500 mL Fluid  Diet effective now       ?  ? ?  ?  ? ?  ? ? ?EDUCATION NEEDS:  ? ?Not appropriate for education at this time ? ?Skin:  Skin Assessment: Reviewed RN Assessment ? ?Last BM:  Unknown ? ?Height:  ? ?Ht Readings from Last 1 Encounters:  ?04/17/22 '5\' 2"'$  (1.575 m)  ? ? ?Weight:  ? ?Wt Readings from Last 1 Encounters:  ?04/17/22 69 kg  ? ?BMI:  Body mass index is 27.82 kg/m?. ? ?Estimated Nutritional Needs:  ? ?Kcal:  1750-1900 ? ?Protein:  80-90 grams ? ?Fluid:  1.5 L/day ? ?Corrin Parker, MS, RD, LDN ?RD pager number/after hours weekend pager number on Amion. ? ?

## 2022-04-17 NOTE — H&P (Signed)
?History and Physical  ? ? ?Patient: [name] DHR:[id number] DOB: [birth year] ?DOA: 04/16/2022 ?DOS: the patient was seen and examined on 04/17/2022 ?PCP: Isaac Bliss, Rayford Halsted, MD  ?Patient coming from: Home - lives with husband; NOK: Gabryella, [place], [phone] ? ? ?Chief Complaint: SOB ? ?HPI: Catherine KOCOUREK is a 81 y.o. female with medical history significant of AML (2015); chronic back pain; COPD; CAD; chronic diastolic CHF; DM; HTN; HLD; and breast cancer presenting with SOB.  She began having SOB earlier this week.  She saw her PCP yesterday and her torsemide dose was increased.  She was told that if it didn't get better she should come to the ER.  She was too SOB to successfully move around her house and so decided to come in.  +orthopnea.   ? ?She has been having chronic stool issues.  She said at one point it looked like "spaghetti sauce with oily coating".  She has seen her doctor and they weren't worried about it.  She is having repeated BMs with some abdominal pain. ? ? ? ?ER Course:  CHF - cardiology decreased Torsemide dose recently and now overloaded.  Also with abdominal pain.  PE study negative other than moderate effusions.  CT abd with mesenteric edema and peripancreatic stranding. ? ? ? ? ?Review of Systems: As mentioned in the history of present illness. All other systems reviewed and are negative. ?Past Medical History:  ?Diagnosis Date  ? Acute myelogenous leukemia ([place]) 02/2014  ? Acute pancreatitis   ? Arthritis   ? "back" (09/17/2017)  ? Atrial flutter ([place])   ? Cardiomyopathy ([place])   ? Chronic kidney disease, stage II (mild)   ? Chronic lower back pain   ? Colon polyps 04/29/2010  ? TUBULAR ADENOMA AND A SERRATED ADENOMA  ? COPD (chronic obstructive pulmonary disease) ([place])   ? CORONARY ARTERY DISEASE 12/24/2007  ? DIABETES MELLITUS, TYPE II 07/13/2007  ? History of blood transfusion 2015  ? "related to leukemia"  ? History of kidney stones 12/24/2007  ? History of uterine  cancer   ? HYPERLIPIDEMIA 12/24/2007  ? HYPERTENSION 07/13/2007  ? NSTEMI (non-ST elevated myocardial infarction) ([place]) 09/17/2017  ? Uterine cancer ([place])   ? ?Past Surgical History:  ?Procedure Laterality Date  ? ABDOMINAL HYSTERECTOMY    ? "still have my ovaries"  ? BREAST BIOPSY Left 09/22/2019  ? BREAST LUMPECTOMY Left 10/24/2019  ? BREAST LUMPECTOMY WITH RADIOACTIVE SEED LOCALIZATION Left 10/24/2019  ? Procedure: LEFT BREAST LUMPECTOMY WITH RADIOACTIVE SEED LOCALIZATION;  Surgeon: Jovita Kussmaul, MD;  Location: [facility];  Service: General;  Laterality: Left;  ? CARDIAC CATHETERIZATION  2002  ? CARDIAC CATHETERIZATION  09/17/2017  ? CATARACT EXTRACTION W/ INTRAOCULAR LENS  IMPLANT, BILATERAL Bilateral   ? COLONOSCOPY W/ BIOPSIES AND POLYPECTOMY  2011  ? CORONARY STENT INTERVENTION N/A 09/21/2017  ? Procedure: CORONARY STENT INTERVENTION;  Surgeon: Troy Sine, MD;  Location: [facility];  Service: Cardiovascular;  Laterality: N/A;  ? DILATION AND CURETTAGE OF UTERUS    ? ESOPHAGOGASTRODUODENOSCOPY N/A 01/30/2022  ? Procedure: ESOPHAGOGASTRODUODENOSCOPY (EGD);  Surgeon: Milus Banister, MD;  Location: [facility];  Service: Endoscopy;  Laterality: N/A;  ? EUS N/A 01/30/2022  ? Procedure: UPPER ENDOSCOPIC ULTRASOUND (EUS) RADIAL;  Surgeon: Milus Banister, MD;  Location: [facility];  Service: Endoscopy;  Laterality: N/A;  ? EXCISIONAL HEMORRHOIDECTOMY  X 2  ? FINE NEEDLE ASPIRATION N/A 01/30/2022  ? Procedure: FINE NEEDLE  ASPIRATION (FNA) LINEAR;  Surgeon: Milus Banister, MD;  Location: [facility];  Service: Endoscopy;  Laterality: N/A;  ? GANGLION CYST EXCISION Left X 3  ? INTRAVASCULAR PRESSURE WIRE/FFR STUDY N/A 09/17/2017  ? Procedure: INTRAVASCULAR PRESSURE WIRE/FFR STUDY;  Surgeon: Nelva Bush, MD;  Location: [facility];  Service: Cardiovascular;  Laterality: N/A;  ? NASAL SINUS SURGERY    ? PORTA CATH INSERTION Right 2013  ? RIGHT/LEFT HEART CATH AND CORONARY  ANGIOGRAPHY N/A 09/17/2017  ? Procedure: RIGHT/LEFT HEART CATH AND CORONARY ANGIOGRAPHY;  Surgeon: Nelva Bush, MD;  Location: [facility];  Service: Cardiovascular;  Laterality: N/A;  ? TUBAL LIGATION    ? ?Social History:  reports that she quit smoking about 21 years ago. Her smoking use included cigarettes. She has a 20.00 pack-year smoking history. She has never used smokeless tobacco. She reports that she does not drink alcohol and does not use drugs. ? ?No Known Allergies ? ?Family History  ?Problem Relation Age of Onset  ? COPD Father   ? Esophageal cancer Father   ? Breast cancer Sister 59  ? Lung cancer Sister   ? Esophageal cancer Brother   ? Suicidality Brother   ? Lung cancer Sister   ? Lung cancer Sister   ? Cervical cancer Sister   ? Congestive Heart Failure Mother   ? Heart attack Maternal Grandmother   ? Skin cancer Maternal Grandfather   ? Heart attack Maternal Grandfather   ? Stroke Paternal Grandmother 90  ? Heart attack Paternal Grandfather   ? Cancer Maternal Uncle   ?     unknown type, dx. >65  ? Cancer Paternal Aunt   ?     unknown type, dx. >50  ? Cancer Paternal Uncle   ?     unknown type, dx. >50  ? Cirrhosis Maternal Uncle   ? Skin cancer Daughter   ? Leukemia Cousin   ?     maternal first cousin; dx. >50, in remission  ? Cancer Niece   ?     unknown type behind her ear, dx. 30s/40s  ? Colon cancer Neg Hx   ? ? ?Prior to Admission medications   ?Medication Sig Start Date End Date Taking? Authorizing Provider  ?amLODipine (NORVASC) 10 MG tablet TAKE 1 TABLET EVERY DAY ?Patient taking differently: Take 10 mg by mouth daily. 12/30/21  Yes Isaac Bliss, Rayford Halsted, MD  ?atorvastatin (LIPITOR) 40 MG tablet TAKE 1 TABLET EVERY DAY ?Patient taking differently: Take 40 mg by mouth daily. 12/30/21  Yes Erline Hau, MD  ?Blood Glucose Monitoring Suppl (TRUE METRIX GO GLUCOSE METER) w/Device KIT USE TO CHECK BLOOD SUGARS THREE TIMES A DAY AND PRN ?Patient taking  differently: 3 (three) times daily. 04/16/22  Yes Isaac Bliss, Rayford Halsted, MD  ?clopidogrel (PLAVIX) 75 MG tablet TAKE 1 TABLET EVERY EVENING ?Patient taking differently: Take 75 mg by mouth every evening. 12/30/21  Yes Isaac Bliss, Rayford Halsted, MD  ?cyanocobalamin (,VITAMIN B-12,) 1000 MCG/ML injection INJECT 1ML IN THE MUSCLE EVERY 30 DAYS AS DIRECTED ?Patient taking differently: Inject 1,000 mcg into the muscle every 30 (thirty) days. 07/11/21  Yes Isaac Bliss, Rayford Halsted, MD  ?fluticasone furoate-vilanterol (BREO ELLIPTA) 200-25 MCG/ACT AEPB Inhale 1 puff into the lungs as needed (wheezing SOB).   Yes [provider]  ?glucose blood (TRUE METRIX BLOOD GLUCOSE TEST) test strip TEST BLOOD SUGAR THREE TIMES DAILY ?Patient taking differently: 3 (three) times daily. 05/23/21  Yes  Isaac Bliss, Rayford Halsted, MD  ?HYDROcodone-acetaminophen [facility]) 5-325 MG tablet Take 1 tablet by mouth every 6 (six) hours as needed for moderate pain. 04/16/22  Yes Erline Hau, MD  ?insulin glargine, 2 Unit Dial, (TOUJEO MAX SOLOSTAR) 300 UNIT/ML Solostar Pen Inject 16 Units into the skin at bedtime. 04/10/21  Yes Shamleffer, Melanie Crazier, MD  ?isosorbide mononitrate (IMDUR) 30 MG 24 hr tablet TAKE 1 AND 1/2 TABLETS EVERY DAY ?Patient taking differently: Take 30 mg by mouth daily. TAKE 1 AND 1/2 TABLETS EVERY DAY 12/30/21  Yes Isaac Bliss, Rayford Halsted, MD  ?letrozole [facility]) 2.5 MG tablet Take 1 tablet (2.5 mg total) by mouth daily. 02/14/22  Yes Owens Shark, NP  ?levETIRAcetam (KEPPRA) 500 MG tablet Take 1 tablet (500 mg total) by mouth 2 (two) times daily. 01/20/22 04/20/22 Yes Alric Ran, MD  ?lipase/protease/amylase (CREON) 36000 UNITS CPEP capsule Take 2 capsules (72,000 Units total) by mouth 3 (three) times daily with meals. ?Patient taking differently: Take 72,000 Units by mouth 3 (three) times daily as needed (loose stools). 12/17/21  Yes Esterwood, Amy S, PA-C  ?LORazepam (ATIVAN) 0.5 MG tablet  TAKE 1 TABLET BY MOUTH EVERY 8 HOURS AS NEEDED FOR ANXIETY(DO NOT EXCEED MORE THAN 2 TABS IN 24 HRS) ?Patient taking differently: Take 0.5 mg by mouth every 8 (eight) hours as needed for anxiety. 04/16/22  Yes [place]

## 2022-04-17 NOTE — ED Notes (Signed)
MD approve pt to have something to drink ?

## 2022-04-18 ENCOUNTER — Encounter (HOSPITAL_COMMUNITY): Payer: Self-pay | Admitting: Internal Medicine

## 2022-04-18 ENCOUNTER — Ambulatory Visit (HOSPITAL_BASED_OUTPATIENT_CLINIC_OR_DEPARTMENT_OTHER): Payer: Medicare HMO | Attending: Nurse Practitioner

## 2022-04-18 ENCOUNTER — Other Ambulatory Visit (HOSPITAL_COMMUNITY): Payer: Self-pay

## 2022-04-18 DIAGNOSIS — I480 Paroxysmal atrial fibrillation: Secondary | ICD-10-CM

## 2022-04-18 DIAGNOSIS — I5033 Acute on chronic diastolic (congestive) heart failure: Secondary | ICD-10-CM | POA: Diagnosis not present

## 2022-04-18 LAB — BASIC METABOLIC PANEL
Anion gap: 7 (ref 5–15)
BUN: 12 mg/dL (ref 8–23)
CO2: 28 mmol/L (ref 22–32)
Calcium: 7.9 mg/dL — ABNORMAL LOW (ref 8.9–10.3)
Chloride: 105 mmol/L (ref 98–111)
Creatinine, Ser: 1.28 mg/dL — ABNORMAL HIGH (ref 0.44–1.00)
GFR, Estimated: 42 mL/min — ABNORMAL LOW (ref >60)
Glucose, Bld: 53 mg/dL — ABNORMAL LOW (ref 70–99)
Potassium: 2.9 mmol/L — ABNORMAL LOW (ref 3.5–5.1)
Sodium: 140 mmol/L (ref 135–145)

## 2022-04-18 LAB — GASTROINTESTINAL PANEL BY PCR, STOOL (REPLACES STOOL CULTURE)

## 2022-04-18 LAB — CBC
HCT: 32.1 % — ABNORMAL LOW (ref 36.0–46.0)
Hemoglobin: 10.9 g/dL — ABNORMAL LOW (ref 12.0–15.0)
MCH: 30.8 pg (ref 26.0–34.0)
MCHC: 34 g/dL (ref 30.0–36.0)
MCV: 90.7 fL (ref 80.0–100.0)
Platelets: 115 10*3/uL — ABNORMAL LOW (ref 150–400)
RBC: 3.54 MIL/uL — ABNORMAL LOW (ref 3.87–5.11)
RDW: 13.2 % (ref 11.5–15.5)
WBC: 6.1 10*3/uL (ref 4.0–10.5)
nRBC: 0 % (ref 0.0–0.2)

## 2022-04-18 LAB — GLUCOSE, CAPILLARY
Glucose-Capillary: 61 mg/dL — ABNORMAL LOW (ref 70–99)
Glucose-Capillary: 64 mg/dL — ABNORMAL LOW (ref 70–99)
Glucose-Capillary: 152 mg/dL — ABNORMAL HIGH (ref 70–99)
Glucose-Capillary: 153 mg/dL — ABNORMAL HIGH (ref 70–99)
Glucose-Capillary: 295 mg/dL — ABNORMAL HIGH (ref 70–99)
Glucose-Capillary: 441 mg/dL — ABNORMAL HIGH (ref 70–99)
Glucose-Capillary: 393 mg/dL — ABNORMAL HIGH (ref 70–99)

## 2022-04-18 MED ORDER — INSULIN ASPART 100 UNIT/ML IJ SOLN
0.0000 [IU] | Freq: Three times a day (TID) | INTRAMUSCULAR | Status: DC
  Administered 2022-04-18: 2 [IU] via SUBCUTANEOUS
  Administered 2022-04-18: 5 [IU] via SUBCUTANEOUS

## 2022-04-18 MED ORDER — SODIUM CHLORIDE 0.9% FLUSH: 10.0000 mL | INTRAVENOUS | Status: DC | PRN

## 2022-04-18 MED ORDER — SACUBITRIL-VALSARTAN 24-26 MG PO TABS
1.0000 | ORAL_TABLET | Freq: Two times a day (BID) | ORAL | Status: DC
  Administered 2022-04-18 – 2022-04-21 (×7): 1 via ORAL
  Filled 2022-04-18 (×7): qty 1

## 2022-04-18 MED ORDER — INSULIN GLARGINE-YFGN 100 UNIT/ML ~~LOC~~ SOLN
10.0000 [IU] | Freq: Every day | SUBCUTANEOUS | Status: DC
  Administered 2022-04-18: 10 [IU] via SUBCUTANEOUS
  Filled 2022-04-18 (×2): qty 0.1

## 2022-04-18 MED ORDER — GLUCOSE 40 % PO GEL
ORAL | Status: AC
Start: 2022-04-18 — End: 2022-04-18
  Administered 2022-04-18: 31 g
  Filled 2022-04-18: qty 1

## 2022-04-18 MED ORDER — ENOXAPARIN SODIUM 40 MG/0.4ML IJ SOSY
40.0000 mg | PREFILLED_SYRINGE | INTRAMUSCULAR | Status: DC
  Administered 2022-04-18: 40 mg via SUBCUTANEOUS
  Filled 2022-04-18: qty 0.4

## 2022-04-18 MED ORDER — METOPROLOL SUCCINATE ER 25 MG PO TB24
25.0000 mg | ORAL_TABLET | Freq: Every day | ORAL | Status: DC
  Administered 2022-04-18 – 2022-04-21 (×4): 25 mg via ORAL
  Filled 2022-04-18 (×4): qty 1

## 2022-04-18 MED ORDER — SODIUM CHLORIDE 0.9% FLUSH
10.0000 mL | Freq: Two times a day (BID) | INTRAVENOUS | Status: DC
  Administered 2022-04-18 – 2022-04-20 (×6): 10 mL

## 2022-04-18 MED ORDER — LOPERAMIDE HCL 2 MG PO CAPS
2.0000 mg | ORAL_CAPSULE | Freq: Once | ORAL | Status: AC
  Administered 2022-04-18: 2 mg via ORAL
  Filled 2022-04-18: qty 1

## 2022-04-18 MED ORDER — CHLORHEXIDINE GLUCONATE CLOTH 2 % EX PADS
6.0000 | MEDICATED_PAD | Freq: Every day | CUTANEOUS | Status: DC
  Administered 2022-04-18 – 2022-04-21 (×4): 6 via TOPICAL

## 2022-04-18 MED ORDER — APIXABAN 2.5 MG PO TABS
2.5000 mg | ORAL_TABLET | Freq: Two times a day (BID) | ORAL | Status: DC
  Administered 2022-04-18 – 2022-04-21 (×7): 2.5 mg via ORAL
  Filled 2022-04-18 (×7): qty 1

## 2022-04-18 MED ORDER — INSULIN ASPART 100 UNIT/ML IJ SOLN
0.0000 [IU] | Freq: Every day | INTRAMUSCULAR | Status: DC
  Administered 2022-04-18 – 2022-04-19 (×2): 5 [IU] via SUBCUTANEOUS
  Administered 2022-04-20: 2 [IU] via SUBCUTANEOUS

## 2022-04-18 MED ORDER — INSULIN ASPART 100 UNIT/ML IJ SOLN
0.0000 [IU] | Freq: Three times a day (TID) | INTRAMUSCULAR | Status: DC
  Administered 2022-04-19: 5 [IU] via SUBCUTANEOUS
  Administered 2022-04-19: 9 [IU] via SUBCUTANEOUS
  Administered 2022-04-20: 3 [IU] via SUBCUTANEOUS
  Administered 2022-04-20 – 2022-04-21 (×2): 2 [IU] via SUBCUTANEOUS
  Administered 2022-04-21: 5 [IU] via SUBCUTANEOUS

## 2022-04-18 MED ORDER — POTASSIUM CHLORIDE CRYS ER 20 MEQ PO TBCR
40.0000 meq | EXTENDED_RELEASE_TABLET | Freq: Three times a day (TID) | ORAL | Status: AC
  Administered 2022-04-18 (×3): 40 meq via ORAL
  Filled 2022-04-18 (×3): qty 2

## 2022-04-18 NOTE — Evaluation (Signed)
Occupational Therapy Evaluation ?Patient Details ?Name: [name] ?MRN: [id number] ?DOB: 1941-08-25 ?Today's Date: 04/18/2022 ? ? ?History of Present Illness Patient is a 81 yo female presenting to the ED with SOB on 04/17/22, admitted same day with CHF exacerbation. PMH includes:  AML (2015); chronic back pain; COPD; CAD; chronic diastolic CHF; DM; HTN; HLD; NSTEMI, CAD 3 vessel disease, and breast and uterine cancer  ? ?Clinical Impression ?  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 Evaluation limited due to frequent stools needing to return to [facility] prior to full assessment of functional mobility. Patient is currently min A for lower body ADLs, and supervision without need for AD for sit<>stand transfers occasionally reaching out for surfaces). OT will continue to follow acutely, but recommending no OT follow up at discharge.  ?   ? ?Recommendations for follow up therapy are one component of a multi-disciplinary discharge planning process, led by the attending physician.  Recommendations may be updated based on patient status, additional functional criteria and insurance authorization.  ? ?Follow Up Recommendations ? No OT follow up  ?  ?Assistance Recommended at Discharge PRN  ?Patient can return home with the following A little help with bathing/dressing/bathroom;A little help with walking and/or transfers;Assistance with cooking/housework;Assist for transportation ? ?  ?Functional Status Assessment ? Patient has had a recent decline in their functional status and demonstrates the ability to make significant improvements in function in a reasonable and predictable amount of time.  ?Equipment Recommendations ? None recommended by OT  ?  ?Recommendations for Other Services   ? ? ?  ?Precautions / Restrictions Precautions ?Precautions: Fall ?Restrictions ?Weight Bearing Restrictions: No  ? ?  ? ?Mobility Bed Mobility ?  ?  ?  ?  ?  ?  ?  ?General  bed mobility comments: up on [facility] [address] upon OT arrival ?  ? ?Transfers ?Overall transfer level: Needs assistance ?  ?Transfers: Sit to/from Stand ?Sit to Stand: Supervision ?  ?  ?  ?  ?  ?General transfer comment: supervision for safety no need for AD ?  ? ?  ?Balance Overall balance assessment: Mild deficits observed, not formally tested ?  ?  ?  ?  ?  ?  ?  ?  ?  ?  ?  ?  ?  ?  ?  ?  ?  ?  ?   ? ?ADL either performed or assessed with clinical judgement  ? ?ADL Overall ADL's : Needs assistance/impaired ?Eating/Feeding: Set up;Sitting ?  ?Grooming: Wash/dry hands;Set up;Standing ?  ?Upper Body Bathing: Set up;Sitting ?  ?Lower Body Bathing: Minimal assistance;Sitting/lateral leans;Sit to/from stand ?  ?Upper Body Dressing : Set up;Sitting ?  ?Lower Body Dressing: Minimal assistance;Sitting/lateral leans;Sit to/from stand ?  ?Toilet Transfer: Min guard;BSC/3in1 ?  ?Toileting- Clothing Manipulation and Hygiene: Set up;Sitting/lateral lean ?  ?  ?  ?Functional mobility during ADLs: Minimal assistance ?General ADL Comments: Patient demonstrating minimal decrease activity tolerance and minimal discomfort due to frequent stools  ? ? ? ?Vision Baseline Vision/History: 1 Wears glasses (Readers) ?Ability to See in Adequate Light: 0 Adequate ?Patient Visual Report: No change from baseline ?   ?   ?Perception   ?  ?Praxis   ?  ? ?Pertinent Vitals/Pain Pain Assessment ?Pain Assessment: Faces ?Faces Pain Scale: Hurts a little bit ?Pain Location: generalized discomfort due to frequent stools ?Pain Descriptors / Indicators: Discomfort, Grimacing ?Pain Intervention(s): Repositioned, Limited activity  within patient's tolerance, Monitored during session  ? ? ? ?Hand Dominance   ?  ?Extremity/Trunk Assessment Upper Extremity Assessment ?Upper Extremity Assessment: Overall WFL for tasks assessed ?  ?Lower Extremity Assessment ?Lower Extremity Assessment: Overall WFL for tasks assessed;Defer to PT evaluation ?  ?Cervical / Trunk  Assessment ?Cervical / Trunk Assessment: Kyphotic (Minimally) ?  ?Communication Communication ?Communication: No difficulties ?  ?Cognition Arousal/Alertness: Awake/alert ?Behavior During Therapy: [facility] for tasks assessed/performed ?Overall Cognitive Status: Within Functional Limits for tasks assessed ?  ?  ?  ?  ?  ?  ?  ?  ?  ?  ?  ?  ?  ?  ?  ?  ?  ?  ?  ?General Comments    ? ?  ?Exercises   ?  ?Shoulder Instructions    ? ? ?Home Living Family/patient expects to be discharged to:: Private residence ?Living Arrangements: Spouse/significant other ?Available Help at Discharge: Family ?Type of Home: House ?Home Access: Stairs to enter ?Entrance Stairs-Number of Steps: 1 ?Entrance Stairs-Rails: None ?Home Layout: One level ?  ?  ?Bathroom Shower/Tub: Tub/shower unit ?  ?Bathroom Toilet: Handicapped height ?  ?  ?Home Equipment: None ?  ?  ?  ? ?  ?Prior Functioning/Environment Prior Level of Function : Independent/Modified Independent;Working/employed;Driving ?  ?  ?  ?  ?  ?  ?Mobility Comments: works making eye glasses ?ADLs Comments: independent in ADLs and IADLs ?  ? ?  ?  ?OT Problem List: Decreased activity tolerance;Cardiopulmonary status limiting activity;Increased edema ?  ?   ?OT Treatment/Interventions: Self-care/ADL training;Therapeutic exercise;Energy conservation;DME and/or AE instruction;Therapeutic activities;Balance training;Patient/family education  ?  ?OT Goals(Current goals can be found in the care plan section) Acute Rehab OT Goals ?Patient Stated Goal: to feel better ?OT Goal Formulation: With patient ?Time For Goal Achievement: 05/02/22 ?Potential to Achieve Goals: Good ?ADL Goals ?Pt Will Perform Lower Body Bathing: Independently;sitting/lateral leans;sit to/from stand ?Pt Will Perform Lower Body Dressing: Independently;with adaptive equipment;sitting/lateral leans ?Pt Will Transfer to Toilet: Independently;ambulating ?Additional ADL Goal #1: Patient will demonstrate increased activity tolerance  to complete functional task in standing for 3-5 minutes without need for seated rest break. ?Additional ADL Goal #2: Patient will verbalize 3 strategies for energy conservation for safe discharge home.  ?OT Frequency: Min 2X/week ?  ? ?Co-evaluation   ?  ?  ?  ?  ? ?  ?AM-PAC OT "6 Clicks" Daily Activity     ?Outcome Measure Help from another person eating meals?: A Little ?Help from another person taking care of personal grooming?: A Little ?Help from another person toileting, which includes using toliet, bedpan, or urinal?: A Little ?Help from another person bathing (including washing, rinsing, drying)?: A Little ?Help from another person to put on and taking off regular upper body clothing?: A Little ?Help from another person to put on and taking off regular lower body clothing?: A Little ?6 Click Score: 18 ?  ?End of Session Nurse Communication: Mobility status ? ?Activity Tolerance: Patient limited by fatigue (Session limited due to frequent stools) ?Patient left: with call bell/phone within reach;Other (comment) (on [facility]) ? ?OT Visit Diagnosis: Unsteadiness on feet (R26.81);Muscle weakness (generalized) (M62.81)  ?              ?Time: 6384-6659 ?OT Time Calculation (min): 10 min ?Charges:  OT General Charges ?$OT Visit: 1 Visit ?OT Evaluation ?$OT Eval Moderate Complexity: 1 Mod ? ?Corinne Ports E. Luvada Salamone, OTR/L ?Acute Rehabilitation Services ?[phone] ?[phone]  ? ?  Corinne Ports Redith Drach ?04/18/2022, 11:50 AM ?

## 2022-04-18 NOTE — Evaluation (Signed)
Physical Therapy Evaluation ?Patient Details ?Name: [name] ?MRN: [id number] ?DOB: 1940-12-27 ?Today's Date: 04/18/2022 ? ?History of Present Illness ? Patient is a 81 yo female presenting to the ED with SOB on 04/17/22, admitted same day with CHF exacerbation. PMH includes:  AML (2015); chronic back pain; COPD; CAD; chronic diastolic CHF; DM; HTN; HLD; NSTEMI, CAD 3 vessel disease, and breast and uterine cancer ?  ?Clinical Impression ? Pt in bed upon arrival of PT, agreeable to evaluation at this time. Prior to admission the pt was completely independent, still working (making eye glasses), driving, and able to complete all ADLs and IADLs without need for DME. The pt does report little activity or exercise on consistent basis, and progressive difficulty with endurance. The pt was able to maintain SpO2 > 93% on RA with activity, but does report 3/4 DOE with activity. However, the pt states the 150 ft she walked today during the session is farther than she would ever walk at home. Pt encouraged to take up progressive walking program and she expressed verbal understanding. Will maintain on caseload during admission to maintain mobility, strength, and endurance, do not anticipate follow up needs at d/c.   ?   ?   ? ?Recommendations for follow up therapy are one component of a multi-disciplinary discharge planning process, led by the attending physician.  Recommendations may be updated based on patient status, additional functional criteria and insurance authorization. ? ?Follow Up Recommendations No PT follow up ? ?  ?Assistance Recommended at Discharge Intermittent Supervision/Assistance  ?Patient can return home with the following ?   ? ?  ?Equipment Recommendations None recommended by PT  ?Recommendations for Other Services ?    ?  ?Functional Status Assessment Patient has had a recent decline in their functional status and demonstrates the ability to make significant improvements in function in a reasonable and  predictable amount of time.  ? ?  ?Precautions / Restrictions Precautions ?Precautions: Fall ?Restrictions ?Weight Bearing Restrictions: No  ? ?  ? ?Mobility ? Bed Mobility ?Overal bed mobility: Independent ?  ?  ?  ?  ?  ?  ?General bed mobility comments: pt completing without increased time or assist ?  ? ?Transfers ?Overall transfer level: Needs assistance ?Equipment used: None ?Transfers: Sit to/from Stand ?Sit to Stand: Supervision ?  ?  ?  ?  ?  ?General transfer comment: supervision for safety no need for AD ?  ? ?Ambulation/Gait ?Ambulation/Gait assistance: Supervision ?Gait Distance (Feet): 150 Feet ?Assistive device: None ?Gait Pattern/deviations: Step-through pattern, Decreased stride length ?Gait velocity: 0.71 m/s ?Gait velocity interpretation: 1.31 - 2.62 ft/sec, indicative of limited community ambulator ?  ?General Gait Details: pt with small strides but no overt LOB. reports her SOB is better than on admission, only SOB at and of ambulation. SpO2 remained > 93% with all mobility. ? ?  ? ?Balance Overall balance assessment: Mild deficits observed, not formally tested ?  ?  ?  ?  ?  ?  ?  ?  ?  ?  ?  ?  ?  ?  ?  ?  ?  ?  ?   ? ? ? ?Pertinent Vitals/Pain Pain Assessment ?Pain Assessment: Faces ?Faces Pain Scale: Hurts a little bit ?Pain Location: generalized discomfort due to frequent stools ?Pain Descriptors / Indicators: Discomfort, Grimacing ?Pain Intervention(s): Monitored during session, Repositioned  ? ? ?Home Living Family/patient expects to be discharged to:: Private residence ?Living Arrangements: Spouse/significant other ?Available Help at Discharge:  Family (lives with "disabled" spouse) ?Type of Home: House ?Home Access: Stairs to enter ?Entrance Stairs-Rails: None ?Entrance Stairs-Number of Steps: 1 ?  ?Home Layout: One level ?Home Equipment: None ?   ?  ?Prior Function Prior Level of Function : Independent/Modified Independent;Working/employed;Driving ?  ?  ?  ?  ?  ?  ?Mobility Comments:  works making eye glasses ?ADLs Comments: independent in ADLs and IADLs ?  ? ? ?Hand Dominance  ?   ? ?  ?Extremity/Trunk Assessment  ? Upper Extremity Assessment ?Upper Extremity Assessment: Defer to OT evaluation ?  ? ?Lower Extremity Assessment ?Lower Extremity Assessment: Overall WFL for tasks assessed ?  ? ?Cervical / Trunk Assessment ?Cervical / Trunk Assessment: Kyphotic (Minimally)  ?Communication  ? Communication: No difficulties  ?Cognition Arousal/Alertness: Awake/alert ?Behavior During Therapy: [facility] for tasks assessed/performed ?Overall Cognitive Status: Within Functional Limits for tasks assessed ?  ?  ?  ?  ?  ?  ?  ?  ?  ?  ?  ?  ?  ?  ?  ?  ?  ?  ?  ? ?  ?General Comments General comments (skin integrity, edema, etc.): VSS on RA with gait and rest. ? ?  ?   ? ?Assessment/Plan  ?  ?PT Assessment Patient needs continued PT services  ?PT Problem List Cardiopulmonary status limiting activity;Decreased activity tolerance ? ?   ?  ?PT Treatment Interventions DME instruction;Gait training;Stair training;Functional mobility training;Therapeutic exercise;Therapeutic activities;Patient/family education   ? ?PT Goals (Current goals can be found in the Care Plan section)  ?Acute Rehab PT Goals ?Patient Stated Goal: return home and to work ?PT Goal Formulation: With patient ?Time For Goal Achievement: 05/02/22 ?Potential to Achieve Goals: Good ? ?  ?Frequency Min 2X/week ?  ? ? ?   ?AM-PAC PT "6 Clicks" Mobility  ?Outcome Measure Help needed turning from your back to your side while in a flat bed without using bedrails?: None ?Help needed moving from lying on your back to sitting on the side of a flat bed without using bedrails?: None ?Help needed moving to and from a bed to a chair (including a wheelchair)?: A Little ?Help needed standing up from a chair using your arms (e.g., wheelchair or bedside chair)?: A Little ?Help needed to walk in hospital room?: A Little ?Help needed climbing 3-5 steps with a railing? :  A Little ?6 Click Score: 20 ? ?  ?End of Session Equipment Utilized During Treatment: Gait belt ?Activity Tolerance: Patient tolerated treatment well ?Patient left: in bed;with call bell/phone within reach ?Nurse Communication: Mobility status ?PT Visit Diagnosis: Other abnormalities of gait and mobility (R26.89) ?  ? ?Time: 9163-8466 ?PT Time Calculation (min) (ACUTE ONLY): 13 min ? ? ?Charges:   PT Evaluation ?$PT Eval Low Complexity: 1 Low ?  ?  ?   ? ? ?West Carbo, PT, DPT  ? ?Acute Rehabilitation Department ?Pager #: [phone] ? ?Sandra Cockayne ?04/18/2022, 2:51 PM ? ?

## 2022-04-18 NOTE — Progress Notes (Signed)
Heart Failure Nurse Navigator Progress Note ? ?PCP: Isaac Bliss, Rayford Halsted, MD ?PCP-Cardiologist: Cathie Olden ?Admission Diagnosis: Acute congestive heart failure, generalized abdomen pain.  ?Admitted from: Home ? ?Presentation:   ?[name] presented with shortness of breath,2+  bilateral edema x 1 month, still works part-time, and has noticed more shortness of breath with walking to the bathroom. Has some complaints about abdomen pain. BNP 549.2, EKG reports Atrial Fibrillation, IV lasix given, and admitted.  ? ?Patient was educated on the sign and symptoms of heart failure, daily weights, when to call her doctor or come to the ER. Diet/ fluid restrictions, the importance of taking all her medication as prescribed, and following up with all her medical appointments. Patient voiced her understanding of education and is scheduled to come to HF TOC on 04/29/2022 @ 3 pm.  ? ?ECHO/ LVEF: 60-65% ? ?Clinical Course: ? ?Past Medical History:  ?Diagnosis Date  ? Acute myelogenous leukemia ([place]) 02/2014  ? Acute pancreatitis   ? Arthritis   ? "back" (09/17/2017)  ? Atrial flutter ([place])   ? Cardiomyopathy ([place])   ? Chronic kidney disease, stage II (mild)   ? Chronic lower back pain   ? Colon polyps 04/29/2010  ? TUBULAR ADENOMA AND A SERRATED ADENOMA  ? COPD (chronic obstructive pulmonary disease) ([place])   ? CORONARY ARTERY DISEASE 12/24/2007  ? DIABETES MELLITUS, TYPE II 07/13/2007  ? History of blood transfusion 2015  ? "related to leukemia"  ? History of kidney stones 12/24/2007  ? History of uterine cancer   ? HYPERLIPIDEMIA 12/24/2007  ? HYPERTENSION 07/13/2007  ? NSTEMI (non-ST elevated myocardial infarction) ([place]) 09/17/2017  ? Uterine cancer ([place])   ?  ? ?Social History  ? ?Socioeconomic History  ? Marital status: Married  ?  Spouse name: Karalina Tift  ? Number of children: 1  ? Years of education: Not on file  ? Highest education level: 9th grade  ?Occupational History  ? Occupation: works in a lab  ?  Comment:  Make eye glasses part time  ?Tobacco Use  ? Smoking status: Former  ?  Packs/day: 1.00  ?  Years: 20.00  ?  Pack years: 20.00  ?  Types: Cigarettes  ?  Quit date: 12/08/2000  ?  Years since quitting: 21.3  ? Smokeless tobacco: Never  ?Vaping Use  ? Vaping Use: Never used  ?Substance and Sexual Activity  ? Alcohol use: No  ?  Alcohol/week: 0.0 standard drinks  ? Drug use: No  ? Sexual activity: Never  ?Other Topics Concern  ? Not on file  ?Social History Narrative  ? 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 ?Social Determinants of Health  ? ?Financial Resource Strain: Low Risk   ? Difficulty of Paying Living Expenses: Not hard at all  ?Food Insecurity: No Food Insecurity  ? Worried About Charity fundraiser in the Last Year: Never true  ? Ran Out of Food in the Last Year: Never true  ?Transportation Needs: No Transportation Needs  ? Lack of Transportation (Medical): No  ? Lack of Transportation (Non-Medical): No  ?Physical Activity: Not on file  ?Stress: Not on file  ?Social Connections: Not on file  ? ?Education Assessment and Provision: ? ?Detailed education and instructions provided on heart failure disease management including the following: ? ?Signs and symptoms of Heart Failure ?When to call the physician ?Importance of daily weights ?Low sodium diet ?Fluid restriction ?Medication management ?Anticipated future follow-up appointments ? ?Patient education  given on each of the above topics.  Patient acknowledges understanding via teach back method and acceptance of all instructions. ? ?Education Materials:  "Living Better With Heart Failure" Booklet, HF zone tool, & Daily Weight Tracker Tool. ? ?Patient has scale at home: yes ?Patient has pill box at home: NA ?   ?High Risk Criteria for Readmission and/or Poor Patient Outcomes: ?Heart failure hospital admissions (last 6 months): 1  ?No Show rate: 6 % ?Difficult social situation: No ?Demonstrates medication adherence: yes ?Primary Language:  English ?Literacy level: Can read some, writing, and comprehension.  ? ?Barriers of Care:   ?Daily weights ? ? ?Considerations/Referrals:  ? ?Referral made to Heart Failure Pharmacist Stewardship: yes ?Referral made to Heart Failure CSW/NCM TOC: No needs ?Referral made to Heart & Vascular TOC clinic: yes, 04/29/22 @ 3 pm ? ?Items for Follow-up on DC/TOC: ?Daily weights ?Diet/fluid restrictions ? ? ?Earnestine Leys, BSN, RN ?Heart Failure Nurse Navigator ?Secure Chat Only   ?

## 2022-04-18 NOTE — Progress Notes (Signed)
?PROGRESS NOTE ? ? ? ?MADALYNNE GUTMANN  GXQ:[id number] DOB: January 16, 1941 DOA: 04/16/2022 ?PCP: Isaac Bliss, Rayford Halsted, MD  ?[name] is a 81 y.o. female with medical history significant of AML (2015); chronic back pain; COPD; CAD; chronic diastolic CHF; DM; HTN; HLD; and breast cancer presenting with SOB.  She began having SOB earlier this week.  She saw her PCP yesterday and her torsemide dose was increased, despite this continued to have dyspnea on exertion and orthopnea, presented to the ED. ?-Labs noted creatinine of 1.6, BNP of 549, troponin was negative ?-CT chest noted moderate right pleural effusion, left small left effusion, right lower lobe pulmonary nodule complex lesion in the pancreatic head and ductal dilation which is unchanged and cirrhotic liver findings with indeterminate hypodensity ? ?Subjective: ?Feels better, breathing is improving ? ?Assessment and Plan: ? ?Acute on chronic diastolic CHF ?-Clinically improving, continue IV Lasix today ?-Cardiology following, ?-2D echo with preserved EF, mild to moderate aortic stenosis ?-She is 1 L negative, monitor daily weights ?-BMP in a.m. ?-Cirrhosis, hypoalbuminemia also contributing to fluid overload ? ?Chronic diarrhea ?-CT with some pancreatic stranding and previously noted and stable pancreatic head mass s/p EUS and negative cytology ?-Normal lipase, Negative C diff ?-GI pathogen panel is negative ?-Previously on Creon but she reports not taking regularly because it causes constipation,  ?-Suspect malabsorption, Imodium x1, attempt to restart Creon if diarrhea persists ?  ?H/o AML and recent breast cancer ?-Imaging today with concern for sclerotic mets ?-Oncology following, previously noted on imaging studies ?-Continue letrozole ?-At this time conservative to be in AML remission and on surveillance ?-Follow-up with Dr. [name] ?  ?Seizure d/o ?-Continue Keppra ?  ?COPD ?-Uses Breo prn ?  ?HTN ?-Continue amlodipine, metoprolol ?-Will also add prn  hydralazine ?  ?HLD ?-Continue Lipitor ?  ?DM ?-Last A1c was 5.2 ?-Continue Toujeo (Semglee per formulary) ?-Will cover with moderate-scale SSI for now ?  ?Chronic pain ?-I have reviewed this patient in the [place] Controlled Substances Reporting System.  She is receiving medications from only one provider and appears to be taking them as prescribed. ?-She is not at particularly high risk of opioid misuse, diversion, or overdose.  ?-Continue Norco, Ativan ?  ?CAD ?-s/p stent ?-Too high risk for CABG in 2018 ?-Continue Plavix, Imdur ?-Stable at this time ?  ?Pulmonary nodule ?-New 4 mm nodule ?-Given 20 pack-year history, would recommend f/u with repeat CT scan ?  ?Stage 3b CKD ?-Improving with diuresis, monitor ? ?  ?Advance Care Planning:   Code Status: DNR  ?  ?Consults: Cardiology; oncology; CHF navigator; [facility] team; PT/OT ?  ?DVT Prophylaxis: Lovenox ?Disposition Plan: Home likely 2 to 3 days ? ?Consultants:  ? ? ?Procedures:  ? ?Antimicrobials:  ? ? ?Objective: ?Vitals:  ? 04/17/22 2006 04/18/22 0106 04/18/22 0629 04/18/22 0906  ?BP: (!) 139/95 (!) 144/59 (!) 164/70 (!) 153/67  ?Pulse: 70 65 (!) 58 (!) 59  ?Resp: '18 18 18 16  '$ ?Temp: 98.2 ?F (36.8 ?C) 97.7 ?F (36.5 ?C) (!) 97.3 ?F (36.3 ?C)   ?TempSrc: Oral Oral Oral   ?SpO2: 97% 94% 96%   ?Weight:   68.3 kg   ?Height:      ? ? ?Intake/Output Summary (Last 24 hours) at 04/18/2022 0956 ?Last data filed at 04/18/2022 0636 ?Gross per 24 hour  ?Intake 959 ml  ?Output 1000 ml  ?Net -41 ml  ? ?Filed Weights  ? 04/17/22 1448 04/18/22 0629  ?Weight: 69 kg 68.3 kg  ? ? ?  Examination: ? ?General exam: Appears calm and comfortable  ?HEENT: Positive JVD, port noted ?Respiratory system:  fine basilar Rales ?Cardiovascular system: S1 & S2 heard, RRR.  ?Abd: nondistended, soft and nontender.Normal bowel sounds heard. ?Central nervous system: Alert and oriented. No focal neurological deficits. ?Extremities: 1+ edema ?Skin: No rashes ?Psychiatry:  Mood & affect appropriate.  ? ? ? ?Data  Reviewed:  ? ?CBC: ?Recent Labs  ?Lab 04/16/22 ?2022 04/18/22 ?0345  ?WBC 5.7 6.1  ?NEUTROABS 3.8  --   ?HGB 11.6* 10.9*  ?HCT 35.1* 32.1*  ?MCV 94.1 90.7  ?PLT 132* 115*  ? ?Basic Metabolic Panel: ?Recent Labs  ?Lab 04/16/22 ?2022 04/18/22 ?0345  ?NA 143 140  ?K 3.9 2.9*  ?CL 113* 105  ?CO2 25 28  ?GLUCOSE 256* 53*  ?BUN 13 12  ?CREATININE 1.63* 1.28*  ?CALCIUM 7.9* 7.9*  ?MG 1.9  --   ? ?GFR: ?Estimated Creatinine Clearance: 31.8 mL/min (A) (by C-G formula based on SCr of 1.28 mg/dL (H)). ?Liver Function Tests: ?Recent Labs  ?Lab 04/16/22 ?2022  ?AST 61*  ?ALT 40  ?ALKPHOS 175*  ?BILITOT 0.7  ?PROT 5.8*  ?ALBUMIN 2.9*  ? ?Recent Labs  ?Lab 04/17/22 ?0440  ?LIPASE 17  ? ?No results for input(s): AMMONIA in the last 168 hours. ?Coagulation Profile: ?No results for input(s): INR, PROTIME in the last 168 hours. ?Cardiac Enzymes: ?No results for input(s): CKTOTAL, CKMB, CKMBINDEX, TROPONINI in the last 168 hours. ?BNP (last 3 results) ?No results for input(s): PROBNP in the last 8760 hours. ?HbA1C: ?Recent Labs  ?  04/16/22 ?1550  ?HGBA1C 8.0*  ? ?CBG: ?Recent Labs  ?Lab 04/17/22 ?1551 04/17/22 ?2121 04/18/22 ?7628 04/18/22 ?3151 04/18/22 ?0740  ?GLUCAP 217* 130* 61* 64* 152*  ? ?Lipid Profile: ?No results for input(s): CHOL, HDL, LDLCALC, TRIG, CHOLHDL, LDLDIRECT in the last 72 hours. ?Thyroid Function Tests: ?No results for input(s): TSH, T4TOTAL, FREET4, T3FREE, THYROIDAB in the last 72 hours. ?Anemia Panel: ?No results for input(s): VITAMINB12, FOLATE, FERRITIN, TIBC, IRON, RETICCTPCT in the last 72 hours. ?Urine analysis: ?   ?Component Value Date/Time  ? COLORURINE STRAW (A) 12/05/2021 2233  ? APPEARANCEUR CLEAR 12/05/2021 2233  ? LABSPEC 1.011 12/05/2021 2233  ? LABSPEC 1.015 08/04/2014 1439  ? PHURINE 5.0 12/05/2021 2233  ? GLUCOSEU >=500 (A) 12/05/2021 2233  ? GLUCOSEU Negative 08/04/2014 1439  ? HGBUR SMALL (A) 12/05/2021 2233  ? HGBUR negative 10/10/2010 1607  ? [place] NEGATIVE 12/05/2021 2233  ?  BILIRUBINUR Negative 08/04/2014 1439  ? [place] NEGATIVE 12/05/2021 2233  ? PROTEINUR 30 (A) 12/05/2021 2233  ? UROBILINOGEN 0.2 08/04/2014 1439  ? NITRITE NEGATIVE 12/05/2021 2233  ? LEUKOCYTESUR NEGATIVE 12/05/2021 2233  ? LEUKOCYTESUR Negative 08/04/2014 1439  ? ?Sepsis Labs: ?'@LABRCNTIP'$ (procalcitonin:4,lacticidven:4) ? ?) ?Recent Results (from the past 240 hour(s))  ?Gastrointestinal Panel by PCR , Stool     Status: None  ? Collection Time: 04/17/22  9:29 AM  ? Specimen: Stool  ?Result Value Ref Range Status  ? Campylobacter species NOT DETECTED NOT DETECTED Final  ? Plesimonas shigelloides NOT DETECTED NOT DETECTED Final  ? Salmonella species NOT DETECTED NOT DETECTED Final  ? Yersinia enterocolitica NOT DETECTED NOT DETECTED Final  ? Vibrio species NOT DETECTED NOT DETECTED Final  ? Vibrio cholerae NOT DETECTED NOT DETECTED Final  ? Enteroaggregative E coli (EAEC) NOT DETECTED NOT DETECTED Final  ? Enteropathogenic E coli (EPEC) NOT DETECTED NOT DETECTED Final  ? Enterotoxigenic E coli (ETEC) NOT DETECTED NOT DETECTED Final  ?  Shiga like toxin producing E coli (STEC) NOT DETECTED NOT DETECTED Final  ? Shigella/Enteroinvasive E coli (EIEC) NOT DETECTED NOT DETECTED Final  ? Cryptosporidium NOT DETECTED NOT DETECTED Final  ? Cyclospora cayetanensis NOT DETECTED NOT DETECTED Final  ? Entamoeba histolytica NOT DETECTED NOT DETECTED Final  ? Giardia lamblia NOT DETECTED NOT DETECTED Final  ? Adenovirus F40/41 NOT DETECTED NOT DETECTED Final  ? Astrovirus NOT DETECTED NOT DETECTED Final  ? Norovirus GI/GII NOT DETECTED NOT DETECTED Final  ? Rotavirus A NOT DETECTED NOT DETECTED Final  ? Sapovirus (I, II, IV, and V) NOT DETECTED NOT DETECTED Final  ?  Comment: Performed at [facility], [address], [place], [place] [address]  ?C Difficile Quick Screen w PCR reflex     Status: None  ? Collection Time: 04/17/22  9:29 AM  ? Specimen: STOOL  ?Result Value Ref Range Status  ? C Diff antigen NEGATIVE  NEGATIVE Final  ? C Diff toxin NEGATIVE NEGATIVE Final  ? C Diff interpretation No C. difficile detected.  Final  ?  Comment: Performed at [facility], [place] [address] [place], [place] [address]  ?  ? ?Rad

## 2022-04-18 NOTE — Progress Notes (Signed)
Hypoglycemic Event ? ?CBG: 51 ? ?Treatment: 8 oz juice/soda ? ?Symptoms: None ? ?Follow-up CBG: [id number] CBG Result:64 ? ?Possible Reasons for Event: Unknown ? ? ? ? ?[name] [name] ? ? ?

## 2022-04-18 NOTE — Plan of Care (Signed)
?  Problem: Cardiac: ?Goal: Ability to achieve and maintain adequate cardiopulmonary perfusion will improve ?Outcome: Progressing ?  ?Problem: Clinical Measurements: ?Goal: Diagnostic test results will improve ?Outcome: Progressing ?Goal: Respiratory complications will improve ?Outcome: Progressing ?Goal: Cardiovascular complication will be avoided ?Outcome: Progressing ?  ?Problem: Coping: ?Goal: Level of anxiety will decrease ?Outcome: Progressing ?  ?Problem: Elimination: ?Goal: Will not experience complications related to bowel motility ?Outcome: Progressing ?  ?Problem: Pain Managment: ?Goal: General experience of comfort will improve ?Outcome: Progressing ?  ?Problem: Safety: ?Goal: Ability to remain free from injury will improve ?Outcome: Progressing ?  ?

## 2022-04-18 NOTE — Progress Notes (Signed)
Patient CBG 441. MD notified.  ?

## 2022-04-18 NOTE — TOC Progression Note (Signed)
Transition of Care (TOC) - Progression Note  ? ? ?Patient Details  ?Name: [name] ?MRN: [id number] ?Date of Birth: 02-23-1941 ? ?Transition of Care (TOC) CM/SW Contact  ?Zenon Mayo, RN ?Phone Number: ?04/18/2022, 3:27 PM ? ?Clinical Narrative:    ?From home, indep, HF, pancreatitis, diverticulitis, diarrhea, conts on IV lasix, TOC will continue to follow for dc needs. ? ? ?  ?  ? ?Expected Discharge Plan and Services ?  ?  ?  ?  ?  ?                ?  ?  ?  ?  ?  ?  ?  ?  ?  ?  ? ? ?Social Determinants of Health (SDOH) Interventions ?Food Insecurity Interventions: Intervention Not Indicated ?Financial Strain Interventions: Intervention Not Indicated ?Housing Interventions: Intervention Not Indicated ?Transportation Interventions: Intervention Not Indicated ? ?Readmission Risk Interventions ?   ? View : No data to display.  ?  ?  ?  ? ? ?

## 2022-04-18 NOTE — Progress Notes (Addendum)
Heart Failure Stewardship Pharmacist Progress Note ? ? ?PCP: Isaac Bliss, Rayford Halsted, MD ?PCP-Cardiologist: Mertie Moores, MD  ? ? ?HPI:  ?81 yo female with PMH of HLD, multi-vessel CAD s/p PCI to RCA with residual LAD disease in 2018 (not a CABG candidate), CKD II, T2DM, atrial flutter, uterine cancer and AML (in remission).  Echo 05/11 with LVEF 60-65%, mild LVH, normal RV, and mild/moderate aortic stenosis.  EF improved from 40-45% in 08/2017.  Presented to ED from PCP appointment with significant dyspnea, orthopnea and LEE despite taking an extra dose of PTA torsemide; also found to be in atrial fibrillation.  ? ?Current HF Medications: ?Diuretic: furosemide 40 mg IV twice daily (IV 60 mg x1 in ED) ?Other: magnesium oxide 400 mg BID, potassium 40 mEq x3 ? ?Prior to admission HF Medications: ?Diuretic: torsemide 20 mg daily ?Beta blocker: metoprolol succinate 100 mg daily  ?Other: magnesium oxide 400 mg twice daily ? ?Pertinent Lab Values: ?Serum creatinine 1.28 (down), BUN 12, Potassium 2.9, Sodium 140, BNP 549 (05/10), Magnesium 1.9, A1c 8% ? ?Vital Signs: ?Weight: 150.5 lbs (admission weight: 152 lbs) ?Blood pressure: labile 140-50s/50-70  ?Heart rate: 50s  ?I/O: -200 cc yesterday; net +139 L ? ?Medication Assistance / Insurance Benefits Check: ?Does the patient have prescription insurance?  Yes ?Type of insurance plan: Medicare ? ?Outpatient Pharmacy:  ?Prior to admission outpatient pharmacy: [place] ?Is the patient willing to use [facility] pharmacy at discharge? Pending ?Is the patient willing to transition their outpatient pharmacy to utilize a [facility] outpatient pharmacy?   Pending ?  ? ?Assessment: ?1. Acute on chronic diastolic CHF (LVEF 76-22%). NYHA class II symptoms. ?- Continue furosemide IV 40 mg BID ?- Continue magnesium oxide 400 mg BID ?- Continue potassium 40 mEq x3 ?  ?Plan: ?1) Medication changes recommended at this time: ?- Restart PTA metoprolol succinate at a  lower dose, 25 mg daily - adherent to beta blocker at home, want to avoid further decompensation ?- Start Entresto 24-26 mg twice daily ?- Consider adding Jardiance 10 mg daily (eGFR 42) over the weekend pending renal function with IV diuresis to optimize HFpEF GDMT ? ?2) Patient assistance: ?- Copays: Delene Loll and Jardiance = $45, Farxiga = $95 ?- pending patient discussion ? ?3)  Education  ?- To be completed prior to discharge ?- Discontinue PTA amlodipine on discharge ? ?Laurey Arrow, PharmD ?PGY1 Pharmacy Resident ?04/18/2022  10:40 AM ? ?

## 2022-04-18 NOTE — Progress Notes (Signed)
Patient noted to have some episodes of bradycardia 47 bpm and now sustaining in the 50's on assessment pt asymptomatic  and BP WNL, Md made aware  ?

## 2022-04-18 NOTE — Progress Notes (Signed)
? ?Progress Note ? ?Patient Name: [name] ?Date of Encounter: 04/18/2022 ? ?[facility] Cardiologist: Mertie Moores, MD   ? ?Subjective  ? ?81 year old female with a history of coronary artery disease, status post stenting.  She was not thought to be a candidate for coronary artery bypass grafting.  She has a history of combined systolic and diastolic congestive heart failure, CKD, AML, atrial flutter, uterine cancer.  She was admitted yesterday with a CHF exacerbation. ? ?She is feeling much better today  ? ?Inpatient Medications  ?  ?Scheduled Meds: ? atorvastatin  40 mg Oral Daily  ? Chlorhexidine Gluconate Cloth  6 each Topical Daily  ? clopidogrel  75 mg Oral QPM  ? enoxaparin (LOVENOX) injection  40 mg Subcutaneous Q24H  ? feeding supplement  237 mL Oral BID BM  ? furosemide  40 mg Intravenous BID  ? insulin aspart  0-9 Units Subcutaneous TID WC  ? insulin glargine-yfgn  10 Units Subcutaneous QHS  ? isosorbide mononitrate  30 mg Oral Daily  ? letrozole  2.5 mg Oral Daily  ? levETIRAcetam  500 mg Oral BID  ? loperamide  2 mg Oral Once  ? magnesium oxide  400 mg Oral BID  ? potassium chloride  40 mEq Oral TID  ? sodium chloride flush  10-40 mL Intracatheter Q12H  ? sodium chloride flush  3 mL Intravenous Q12H  ? ?Continuous Infusions: ? ?PRN Meds: ?acetaminophen **OR** acetaminophen, hydrALAZINE, HYDROcodone-acetaminophen, LORazepam, ondansetron **OR** ondansetron (ZOFRAN) IV, sodium chloride flush, sodium chloride flush, traZODone  ? ?Vital Signs  ?  ?Vitals:  ? 04/17/22 1552 04/17/22 2006 04/18/22 0106 04/18/22 0629  ?BP: (!) 147/53 (!) 139/95 (!) 144/59 (!) 164/70  ?Pulse: 70 70 65 (!) 58  ?Resp: '18 18 18 18  '$ ?Temp: 98.1 ?F (36.7 ?C) 98.2 ?F (36.8 ?C) 97.7 ?F (36.5 ?C) (!) 97.3 ?F (36.3 ?C)  ?TempSrc: Oral Oral Oral Oral  ?SpO2: 96% 97% 94% 96%  ?Weight:    68.3 kg  ?Height:      ? ? ?Intake/Output Summary (Last 24 hours) at 04/18/2022 0853 ?Last data filed at 04/18/2022 0636 ?Gross per 24 hour   ?Intake 959 ml  ?Output 1000 ml  ?Net -41 ml  ? ? ?  04/18/2022  ?  6:29 AM 04/17/2022  ?  2:48 PM 04/16/2022  ?  3:44 PM  ?Last 3 Weights  ?Weight (lbs) 150 lb 8 oz 152 lb 1.9 oz 163 lb  ?Weight (kg) 68.266 kg 69 kg 73.936 kg  ?   ? ?Telemetry  ?Atrial fib, controlled V response  Personally Reviewed ? ?ECG  ?  ? - Personally Reviewed ? ?Physical Exam  ? ?GEN: elderly female,  No acute distress.   ?Neck: No JVD ?Cardiac: irreg. Irreg.  ?Respiratory: Clear to auscultation bilaterally. ?GI: Soft, nontender, non-distended  ?MS: No edema; No deformity. ?Neuro:  Nonfocal  ?Psych: Normal affect  ? ?Labs  ?  ?High Sensitivity Troponin:   ?Recent Labs  ?Lab 04/17/22 ?0209 04/17/22 ?0423  ?TROPONINIHS 16 15  ?   ?Chemistry ?Recent Labs  ?Lab 04/16/22 ?2022 04/18/22 ?0345  ?NA 143 140  ?K 3.9 2.9*  ?CL 113* 105  ?CO2 25 28  ?GLUCOSE 256* 53*  ?BUN 13 12  ?CREATININE 1.63* 1.28*  ?CALCIUM 7.9* 7.9*  ?MG 1.9  --   ?PROT 5.8*  --   ?ALBUMIN 2.9*  --   ?AST 61*  --   ?ALT 40  --   ?ALKPHOS 175*  --   ?  BILITOT 0.7  --   ?GFRNONAA 32* 42*  ?ANIONGAP 5 7  ?  ?Lipids No results for input(s): CHOL, TRIG, HDL, LABVLDL, LDLCALC, CHOLHDL in the last 168 hours.  ?Hematology ?Recent Labs  ?Lab 04/16/22 ?2022 04/18/22 ?0345  ?WBC 5.7 6.1  ?RBC 3.73* 3.54*  ?HGB 11.6* 10.9*  ?HCT 35.1* 32.1*  ?MCV 94.1 90.7  ?MCH 31.1 30.8  ?MCHC 33.0 34.0  ?RDW 13.3 13.2  ?PLT 132* 115*  ? ?Thyroid No results for input(s): TSH, FREET4 in the last 168 hours.  ?BNP ?Recent Labs  ?Lab 04/16/22 ?2022  ?BNP 549.2*  ?  ?DDimer  ?Recent Labs  ?Lab 04/17/22 ?0209  ?DDIMER 2.98*  ?  ? ?Radiology  ?  ?DG Chest 2 View ? ?Result Date: 04/16/2022 ?CLINICAL DATA:  Dyspnea. EXAM: CHEST - 2 VIEW COMPARISON:  October 03, 2020. FINDINGS: Stable cardiomediastinal silhouette. Right internal jugular Port-A-Cath is unchanged. Left lung is clear. Minimal right basilar atelectasis or asymmetric edema is noted with small right pleural effusion. Bony thorax is unremarkable. IMPRESSION:  Minimal right basilar subsegmental atelectasis or edema is noted with small right pleural effusion. Aortic Atherosclerosis (ICD10-I70.0). Electronically Signed   By: Marijo Conception M.D.   On: 04/16/2022 21:04  ? ?CT Angio Chest PE W/Cm &/Or Wo Cm ? ?Result Date: 04/17/2022 ?CLINICAL DATA:  Pulmonary embolism suspected, positive D-dimer. Shortness of breath, peripheral edema. Abdominal pain. EXAM: CT ANGIOGRAPHY CHEST CT ABDOMEN AND PELVIS WITH CONTRAST TECHNIQUE: Multidetector CT imaging of the chest was performed using the standard protocol during bolus administration of intravenous contrast. Multiplanar CT image reconstructions and MIPs were obtained to evaluate the vascular anatomy. Multidetector CT imaging of the abdomen and pelvis was performed using the standard protocol during bolus administration of intravenous contrast. RADIATION DOSE REDUCTION: This exam was performed according to the departmental dose-optimization program which includes automated exposure control, adjustment of the mA and/or kV according to patient size and/or use of iterative reconstruction technique. CONTRAST:  38m OMNIPAQUE IOHEXOL 350 MG/ML SOLN COMPARISON:  12/07/2021, 05/08/2020, 12/09/2021. FINDINGS: CTA CHEST FINDINGS Cardiovascular: The heart is enlarged and there is a small pericardial effusion. Multi-vessel coronary artery calcifications are noted. A chest port terminates at the cavoatrial junction. There is atherosclerotic calcification of the aorta without evidence of aneurysm. The pulmonary trunk is normal in caliber. No pulmonary artery filling defect is identified. Mediastinum/Nodes: No enlarged mediastinal, hilar, or axillary lymph nodes. The thyroid gland is heterogeneous in appearance. The trachea and esophagus demonstrate no significant findings. Lungs/Pleura: Mild atelectasis is noted bilaterally. There is a 4 mm nodule in the right lower lobe, axial image 93. There is a moderate pleural effusion on the right and a  small pleural effusion on the left. No pneumothorax. Musculoskeletal: No chest wall mass. Diffuse mixed lytic and sclerotic lesions are noted in the bones. No acute fracture is identified. Review of the MIP images confirms the above findings. CT ABDOMEN and PELVIS FINDINGS Hepatobiliary: The liver has a lobular contour. A vague hypoenhancing region is noted in the inferior aspect of the anterior right lobe of the liver measuring 1.4 cm. No biliary ductal dilatation. A few stones are present in the gallbladder. Pancreas: A complex hypodense structure with calcifications is noted at the head of the pancreas measuring 4.7 x 3.6 cm. Diffuse pancreatic ductal dilatation is noted measuring up to 8 mm, unchanged. There is atrophy pancreatic body and tail. There is suggestion of mild fat stranding at the pancreatic head adjacent to the second portion  of the duodenum. Spleen: Normal in size without focal abnormality. Adrenals/Urinary Tract: The adrenal glands are stable. A punctate calcification is noted in the mid left kidney. Cysts are noted in the kidneys bilaterally. No hydronephrosis. The bladder is unremarkable. Stomach/Bowel: The stomach is within normal limits. There is thickening of the walls of the duodenum with mild adjacent fat stranding at the pancreatic head. No bowel obstruction, free air, or pneumatosis. A normal appendix is seen in the right lower quadrant. Vascular/Lymphatic: Aortic atherosclerosis. No enlarged abdominal or pelvic lymph nodes. Reproductive: Status post hysterectomy. No adnexal masses. Other: Mesenteric edema is noted. A small amount of ascites is noted in all 4 quadrants. Musculoskeletal: Scattered lytic and sclerotic lesions are present in the bones. No acute fracture. Review of the MIP images confirms the above findings. IMPRESSION: 1. No evidence of pulmonary embolism. 2. Moderate right pleural effusion and small left pleural effusion with atelectasis at the lung bases. 3. 4 mm right  lower lobe pulmonary nodule, new from the previous exam. No follow-up needed if patient is low-risk.This recommendation follows the consensus statement: Guidelines for Management of Incidental Pulmonary Nodules

## 2022-04-18 NOTE — Progress Notes (Signed)
Hypoglycemic Event ? ?CBG: 64 ? ?Treatment: 1 tube glucose gel ? ?Symptoms: None ? ?Follow-up CBG: IDPO:[id number] CBG Result:152 ? ?Possible Reasons for Event: Inadequate meal intake ? ? ? ? ? ?[name] [name] ? ? ?

## 2022-04-19 DIAGNOSIS — I5033 Acute on chronic diastolic (congestive) heart failure: Secondary | ICD-10-CM | POA: Diagnosis not present

## 2022-04-19 LAB — CBC
HCT: 34 % — ABNORMAL LOW (ref 36.0–46.0)
Hemoglobin: 11.1 g/dL — ABNORMAL LOW (ref 12.0–15.0)
MCH: 30.2 pg (ref 26.0–34.0)
MCHC: 32.6 g/dL (ref 30.0–36.0)
MCV: 92.4 fL (ref 80.0–100.0)
Platelets: 119 K/uL — ABNORMAL LOW (ref 150–400)
RBC: 3.68 MIL/uL — ABNORMAL LOW (ref 3.87–5.11)
RDW: 13.2 % (ref 11.5–15.5)
WBC: 5.1 K/uL (ref 4.0–10.5)
nRBC: 0 % (ref 0.0–0.2)

## 2022-04-19 LAB — GLUCOSE, CAPILLARY
Glucose-Capillary: 68 mg/dL — ABNORMAL LOW (ref 70–99)
Glucose-Capillary: 88 mg/dL (ref 70–99)
Glucose-Capillary: 93 mg/dL (ref 70–99)
Glucose-Capillary: 282 mg/dL — ABNORMAL HIGH (ref 70–99)
Glucose-Capillary: 384 mg/dL — ABNORMAL HIGH (ref 70–99)
Glucose-Capillary: 466 mg/dL — ABNORMAL HIGH (ref 70–99)

## 2022-04-19 LAB — BASIC METABOLIC PANEL WITH GFR
Anion gap: 3 — ABNORMAL LOW (ref 5–15)
BUN: 15 mg/dL (ref 8–23)
CO2: 31 mmol/L (ref 22–32)
Calcium: 7.8 mg/dL — ABNORMAL LOW (ref 8.9–10.3)
Chloride: 106 mmol/L (ref 98–111)
Creatinine, Ser: 1.5 mg/dL — ABNORMAL HIGH (ref 0.44–1.00)
GFR, Estimated: 35 mL/min — ABNORMAL LOW (ref >60)
Glucose, Bld: 78 mg/dL (ref 70–99)
Potassium: 4.4 mmol/L (ref 3.5–5.1)
Sodium: 140 mmol/L (ref 135–145)

## 2022-04-19 MED ORDER — INSULIN GLARGINE-YFGN 100 UNIT/ML ~~LOC~~ SOLN
8.0000 [IU] | Freq: Every day | SUBCUTANEOUS | Status: DC
  Administered 2022-04-19 – 2022-04-20 (×2): 8 [IU] via SUBCUTANEOUS
  Filled 2022-04-19 (×3): qty 0.08

## 2022-04-19 MED ORDER — TORSEMIDE 20 MG PO TABS
40.0000 mg | ORAL_TABLET | Freq: Every day | ORAL | Status: DC
  Administered 2022-04-19 – 2022-04-21 (×3): 40 mg via ORAL
  Filled 2022-04-19 (×3): qty 2

## 2022-04-19 MED ORDER — EMPAGLIFLOZIN 25 MG PO TABS
25.0000 mg | ORAL_TABLET | Freq: Every day | ORAL | Status: DC
  Administered 2022-04-20 – 2022-04-21 (×2): 25 mg via ORAL
  Filled 2022-04-19 (×2): qty 1

## 2022-04-19 MED ORDER — FUROSEMIDE 10 MG/ML IJ SOLN: 40.0000 mg | Freq: Once | INTRAMUSCULAR | Status: DC

## 2022-04-19 MED ORDER — EMPAGLIFLOZIN 10 MG PO TABS
10.0000 mg | ORAL_TABLET | Freq: Every day | ORAL | Status: DC
  Administered 2022-04-19: 10 mg via ORAL
  Filled 2022-04-19: qty 1

## 2022-04-19 NOTE — Progress Notes (Signed)
Mobility Specialist Progress Note ? ? 04/19/22 1104  ?Mobility  ?Activity Ambulated independently in hallway  ?Level of Assistance Independent after set-up  ?Assistive Device None  ?Distance Ambulated (ft) 260 ft  ?Activity Response Tolerated well  ?$Mobility charge 1 Mobility  ? ?Pre Mobility: 64 HR, 135/41 BP ?During Mobility: 75 HR ?Post Mobility: 70 HR ? ?Received pt in bed having no complaints and agreeable to mobility. Asymptomatic throughout ambulation, returned back to bed w/ call bell in reach and all needs met. ? ?Holland Falling ?Mobility Specialist ?Phone Number [phone] ? ?

## 2022-04-19 NOTE — Progress Notes (Signed)
Hypoglycemic Event ? ?CBG: 68 ? ?Treatment: 8 oz juice/soda ? ?Symptoms: None ? ?Follow-up CBG Time: 8921 CBG Result:93 ? ?Possible Reasons for Event: Unknown ? ?Comments/MD notified:Protocol followed ? ? ? ?[facility] ? ? ?

## 2022-04-19 NOTE — Progress Notes (Signed)
?PROGRESS NOTE ? ? ? ?BENEDETTA SUNDSTROM  NLZ:[id number] DOB: 1941/07/26 DOA: 04/16/2022 ?PCP: Isaac Bliss, Rayford Halsted, MD  ?[name] is a 81 y.o. female with medical history significant of AML (2015); chronic back pain; COPD; CAD; chronic diastolic CHF; DM; HTN; HLD; and breast cancer presenting with SOB.  She began having SOB earlier this week.  She saw her PCP yesterday and her torsemide dose was increased, despite this continued to have dyspnea on exertion and orthopnea, presented to the ED. ?-Labs noted creatinine of 1.6, BNP of 549, troponin was negative ?-CT chest noted moderate right pleural effusion, left small left effusion, right lower lobe pulmonary nodule complex lesion in the pancreatic head and ductal dilation which is unchanged and cirrhotic liver findings with indeterminate hypodensity ? ?Subjective: ?Feels better, breathing is improving ? ?Assessment and Plan: ? ?Acute on chronic diastolic CHF ?-Clinically improving, continue IV Lasix today ?-Cardiology following, ?-2D echo with preserved EF, mild to moderate aortic stenosis ?-Improving with diuretics, urine output and weight inaccurate ?-Cirrhosis, hypoalbuminemia also contributing to fluid overload ?-Switch to torsemide today ?-BMP in a.m., increase activity ? ?Chronic diarrhea ?-CT with some pancreatic stranding and previously noted and stable pancreatic head mass s/p EUS and negative cytology ?-Normal lipase, C. difficile and GI pathogen panel was negative ?-Previously on Creon but she reports not taking regularly because it causes constipation,  ?-Suspect malabsorption, improving, Imodium x1 given yesterday, discussed using Creon few times a week ?  ?H/o AML and recent breast cancer ?-Imaging this admission with concern for sclerotic mets ?-Oncology following, previously noted on imaging studies ?-Continue letrozole ?-At this time considered to be in AML remission and on surveillance ?-Follow-up with Dr. [name] ?  ?Seizure d/o ?-Continue  Keppra ?  ?COPD ?-Uses Breo prn ?  ?HTN ?-Continue amlodipine, metoprolol ?-Will also add prn hydralazine ?  ?HLD ?-Continue Lipitor ?  ?DM ?-Last A1c was 5.2 ?-Continue Toujeo (Semglee per formulary) ?-Will cover with moderate-scale SSI for now ?  ?Chronic pain ?-Continue Norco, Ativan ?  ?CAD ?-s/p stent ?-Too high risk for CABG in 2018 ?-Continue Plavix, Imdur ?-Stable at this time ?  ?Pulmonary nodule ?-New 4 mm nodule ?-Given 20 pack-year history, would recommend f/u with repeat CT scan ?  ?Stage 3b CKD ?-Improving with diuresis, monitor ? ?  ?Advance Care Planning:   Code Status: DNR  ?  ?Consults: Cardiology; oncology; CHF navigator; [facility] team; PT/OT ?  ?DVT Prophylaxis: Lovenox ?Disposition Plan: Home likely 1 to 2 days ? ?Consultants:  ?Cardiology ? ?Procedures:  ? ?Antimicrobials:  ? ? ?Objective: ?Vitals:  ? 04/18/22 2019 04/19/22 0413 04/19/22 0919 04/19/22 9024  ?BP: (!) 142/31 (!) 138/55 (!) 128/48   ?Pulse: 62 60  69  ?Resp: 18 16    ?Temp: 98 ?F (36.7 ?C) 98 ?F (36.7 ?C)    ?TempSrc: Oral Oral    ?SpO2: 94% 97%    ?Weight:  68.8 kg    ?Height:      ? ? ?Intake/Output Summary (Last 24 hours) at 04/19/2022 1110 ?Last data filed at 04/19/2022 0413 ?Gross per 24 hour  ?Intake 790 ml  ?Output 1300 ml  ?Net -510 ml  ? ?Filed Weights  ? 04/17/22 1448 04/18/22 0629 04/19/22 0413  ?Weight: 69 kg 68.3 kg 68.8 kg  ? ? ?Examination: ? ?General exam: Pleasant elderly female laying in bed, AAOx3, no distress ?HEENT: Positive JVD, port noted ?CVS: S1-S2, regular rhythm ?Lungs: Rare basilar rales otherwise clear ?Abdomen: Soft, nontender, bowel sounds  present ?Extremities: Trace edema  ?Skin: No rashes ?Psychiatry:  Mood & affect appropriate.  ? ? ? ?Data Reviewed:  ? ?CBC: ?Recent Labs  ?Lab 04/16/22 ?2022 04/18/22 ?0345 04/19/22 ?3154  ?WBC 5.7 6.1 5.1  ?NEUTROABS 3.8  --   --   ?HGB 11.6* 10.9* 11.1*  ?HCT 35.1* 32.1* 34.0*  ?MCV 94.1 90.7 92.4  ?PLT 132* 115* 119*  ? ?Basic Metabolic Panel: ?Recent Labs  ?Lab  04/16/22 ?2022 04/18/22 ?0345 04/19/22 ?0086  ?NA 143 140 140  ?K 3.9 2.9* 4.4  ?CL 113* 105 106  ?CO2 '25 28 31  '$ ?GLUCOSE 256* 53* 78  ?BUN '13 12 15  '$ ?CREATININE 1.63* 1.28* 1.50*  ?CALCIUM 7.9* 7.9* 7.8*  ?MG 1.9  --   --   ? ?GFR: ?Estimated Creatinine Clearance: 27.2 mL/min (A) (by C-G formula based on SCr of 1.5 mg/dL (H)). ?Liver Function Tests: ?Recent Labs  ?Lab 04/16/22 ?2022  ?AST 61*  ?ALT 40  ?ALKPHOS 175*  ?BILITOT 0.7  ?PROT 5.8*  ?ALBUMIN 2.9*  ? ?Recent Labs  ?Lab 04/17/22 ?0440  ?LIPASE 17  ? ?No results for input(s): AMMONIA in the last 168 hours. ?Coagulation Profile: ?No results for input(s): INR, PROTIME in the last 168 hours. ?Cardiac Enzymes: ?No results for input(s): CKTOTAL, CKMB, CKMBINDEX, TROPONINI in the last 168 hours. ?BNP (last 3 results) ?No results for input(s): PROBNP in the last 8760 hours. ?HbA1C: ?Recent Labs  ?  04/16/22 ?1550  ?HGBA1C 8.0*  ? ?CBG: ?Recent Labs  ?Lab 04/18/22 ?2130 04/18/22 ?2258 04/19/22 ?7619 04/19/22 ?5093 04/19/22 ?2671  ?GLUCAP 441* 393* 68* 93 88  ? ?Lipid Profile: ?No results for input(s): CHOL, HDL, LDLCALC, TRIG, CHOLHDL, LDLDIRECT in the last 72 hours. ?Thyroid Function Tests: ?No results for input(s): TSH, T4TOTAL, FREET4, T3FREE, THYROIDAB in the last 72 hours. ?Anemia Panel: ?No results for input(s): VITAMINB12, FOLATE, FERRITIN, TIBC, IRON, RETICCTPCT in the last 72 hours. ?Urine analysis: ?   ?Component Value Date/Time  ? COLORURINE STRAW (A) 12/05/2021 2233  ? APPEARANCEUR CLEAR 12/05/2021 2233  ? LABSPEC 1.011 12/05/2021 2233  ? LABSPEC 1.015 08/04/2014 1439  ? PHURINE 5.0 12/05/2021 2233  ? GLUCOSEU >=500 (A) 12/05/2021 2233  ? GLUCOSEU Negative 08/04/2014 1439  ? HGBUR SMALL (A) 12/05/2021 2233  ? HGBUR negative 10/10/2010 1607  ? [place] NEGATIVE 12/05/2021 2233  ? BILIRUBINUR Negative 08/04/2014 1439  ? [place] NEGATIVE 12/05/2021 2233  ? PROTEINUR 30 (A) 12/05/2021 2233  ? UROBILINOGEN 0.2 08/04/2014 1439  ? NITRITE NEGATIVE  12/05/2021 2233  ? LEUKOCYTESUR NEGATIVE 12/05/2021 2233  ? LEUKOCYTESUR Negative 08/04/2014 1439  ? ?Sepsis Labs: ?'@LABRCNTIP'$ (procalcitonin:4,lacticidven:4) ? ?) ?Recent Results (from the past 240 hour(s))  ?Gastrointestinal Panel by PCR , Stool     Status: None  ? Collection Time: 04/17/22  9:29 AM  ? Specimen: Stool  ?Result Value Ref Range Status  ? Campylobacter species NOT DETECTED NOT DETECTED Final  ? Plesimonas shigelloides NOT DETECTED NOT DETECTED Final  ? Salmonella species NOT DETECTED NOT DETECTED Final  ? Yersinia enterocolitica NOT DETECTED NOT DETECTED Final  ? Vibrio species NOT DETECTED NOT DETECTED Final  ? Vibrio cholerae NOT DETECTED NOT DETECTED Final  ? Enteroaggregative E coli (EAEC) NOT DETECTED NOT DETECTED Final  ? Enteropathogenic E coli (EPEC) NOT DETECTED NOT DETECTED Final  ? Enterotoxigenic E coli (ETEC) NOT DETECTED NOT DETECTED Final  ? Shiga like toxin producing E coli (STEC) NOT DETECTED NOT DETECTED Final  ? Shigella/Enteroinvasive E coli (EIEC) NOT DETECTED  NOT DETECTED Final  ? Cryptosporidium NOT DETECTED NOT DETECTED Final  ? Cyclospora cayetanensis NOT DETECTED NOT DETECTED Final  ? Entamoeba histolytica NOT DETECTED NOT DETECTED Final  ? Giardia lamblia NOT DETECTED NOT DETECTED Final  ? Adenovirus F40/41 NOT DETECTED NOT DETECTED Final  ? Astrovirus NOT DETECTED NOT DETECTED Final  ? Norovirus GI/GII NOT DETECTED NOT DETECTED Final  ? Rotavirus A NOT DETECTED NOT DETECTED Final  ? Sapovirus (I, II, IV, and V) NOT DETECTED NOT DETECTED Final  ?  Comment: Performed at [facility], [address] [place], [place] [address]  ?C Difficile Quick Screen w PCR reflex     Status: None  ? Collection Time: 04/17/22  9:29 AM  ? Specimen: STOOL  ?Result Value Ref Range Status  ? C Diff antigen NEGATIVE NEGATIVE Final  ? C Diff toxin NEGATIVE NEGATIVE Final  ? C Diff interpretation No C. difficile detected.  Final  ?  Comment: Performed at [facility], [place]  [address], [place], [place] [address]  ?  ? ?Radiology Studies: ?ECHOCARDIOGRAM COMPLETE ? ?Result Date: 04/17/2022 ?   ECHOCARDIOGRAM REPORT   Patient Name:   JHOSELIN CRUME Date of Exam: 04/17/2022 Medical Rec #:  [id number]

## 2022-04-19 NOTE — Progress Notes (Signed)
?Cardiology Progress Note  ?Patient ID: [name] ?MRN: [id number] ?DOB: 17-Mar-1941 ?Date of Encounter: 04/19/2022 ? ?Primary Cardiologist: Mertie Moores, MD ? ?Subjective  ? ?Chief Complaint: SOB ? ?HPI: Breathing improved but not back to baseline.  -510 cc. ? ?ROS:  ?All other ROS reviewed and negative. Pertinent positives noted in the HPI.    ? ?Inpatient Medications  ?Scheduled Meds: ? apixaban  2.5 mg Oral BID  ? atorvastatin  40 mg Oral Daily  ? Chlorhexidine Gluconate Cloth  6 each Topical Daily  ? clopidogrel  75 mg Oral QPM  ? feeding supplement  237 mL Oral BID BM  ? furosemide  40 mg Intravenous BID  ? insulin aspart  0-5 Units Subcutaneous QHS  ? insulin aspart  0-9 Units Subcutaneous TID WC  ? insulin glargine-yfgn  8 Units Subcutaneous QHS  ? isosorbide mononitrate  30 mg Oral Daily  ? letrozole  2.5 mg Oral Daily  ? levETIRAcetam  500 mg Oral BID  ? magnesium oxide  400 mg Oral BID  ? metoprolol succinate  25 mg Oral Daily  ? sacubitril-valsartan  1 tablet Oral BID  ? sodium chloride flush  10-40 mL Intracatheter Q12H  ? sodium chloride flush  3 mL Intravenous Q12H  ? ?Continuous Infusions: ? ?PRN Meds: ?acetaminophen **OR** acetaminophen, hydrALAZINE, HYDROcodone-acetaminophen, LORazepam, ondansetron **OR** ondansetron (ZOFRAN) IV, sodium chloride flush, sodium chloride flush, traZODone  ? ?Vital Signs  ? ?Vitals:  ? 04/18/22 1549 04/18/22 1717 04/18/22 2019 04/19/22 0413  ?BP: (!) 140/49 (!) 146/47 (!) 142/31 (!) 138/55  ?Pulse: 60 (!) 58 62 60  ?Resp: '15  18 16  '$ ?Temp: 97.9 ?F (36.6 ?C)  98 ?F (36.7 ?C) 98 ?F (36.7 ?C)  ?TempSrc: Oral  Oral Oral  ?SpO2:   94% 97%  ?Weight:    68.8 kg  ?Height:      ? ? ?Intake/Output Summary (Last 24 hours) at 04/19/2022 0828 ?Last data filed at 04/19/2022 0413 ?Gross per 24 hour  ?Intake 790 ml  ?Output 1300 ml  ?Net -510 ml  ? ? ?  04/19/2022  ?  4:13 AM 04/18/2022  ?  6:29 AM 04/17/2022  ?  2:48 PM  ?Last 3 Weights  ?Weight (lbs) 151 lb 11.2 oz 150 lb 8 oz 152 lb  1.9 oz  ?Weight (kg) 68.811 kg 68.266 kg 69 kg  ?   ? ?Telemetry  ?Overnight telemetry shows sinus bradycardia heart rate 50s, first-degree AV block, which I personally reviewed.  ? ? ?Physical Exam  ? ?Vitals:  ? 04/18/22 1549 04/18/22 1717 04/18/22 2019 04/19/22 0413  ?BP: (!) 140/49 (!) 146/47 (!) 142/31 (!) 138/55  ?Pulse: 60 (!) 58 62 60  ?Resp: '15  18 16  '$ ?Temp: 97.9 ?F (36.6 ?C)  98 ?F (36.7 ?C) 98 ?F (36.7 ?C)  ?TempSrc: Oral  Oral Oral  ?SpO2:   94% 97%  ?Weight:    68.8 kg  ?Height:      ?  ?Intake/Output Summary (Last 24 hours) at 04/19/2022 0828 ?Last data filed at 04/19/2022 0413 ?Gross per 24 hour  ?Intake 790 ml  ?Output 1300 ml  ?Net -510 ml  ?  ? ?  04/19/2022  ?  4:13 AM 04/18/2022  ?  6:29 AM 04/17/2022  ?  2:48 PM  ?Last 3 Weights  ?Weight (lbs) 151 lb 11.2 oz 150 lb 8 oz 152 lb 1.9 oz  ?Weight (kg) 68.811 kg 68.266 kg 69 kg  ?  Body mass index  is 27.75 kg/m?.  ?General: Well nourished, well developed, in no acute distress ?Head: Atraumatic, normal size  ?Eyes: PEERLA, EOMI  ?Neck: Supple, no JVD ?Endocrine: No thryomegaly ?Cardiac: Normal S1, S2; RRR; no murmurs, rubs, or gallops ?Lungs: Rales at the lung bases ?Abd: Soft, nontender, no hepatomegaly  ?Ext: No edema, pulses 2+ ?Musculoskeletal: No deformities, BUE and BLE strength normal and equal ?Skin: Warm and dry, no rashes   ?Neuro: Alert and oriented to person, place, time, and situation, CNII-XII grossly intact, no focal deficits  ?Psych: Normal mood and affect  ? ?Labs  ?High Sensitivity Troponin:   ?Recent Labs  ?Lab 04/17/22 ?0209 04/17/22 ?0423  ?TROPONINIHS 16 15  ?   ?Cardiac EnzymesNo results for input(s): TROPONINI in the last 168 hours. No results for input(s): TROPIPOC in the last 168 hours.  ?Chemistry ?Recent Labs  ?Lab 04/16/22 ?2022 04/18/22 ?0345 04/19/22 ?2585  ?NA 143 140 140  ?K 3.9 2.9* 4.4  ?CL 113* 105 106  ?CO2 '25 28 31  '$ ?GLUCOSE 256* 53* 78  ?BUN '13 12 15  '$ ?CREATININE 1.63* 1.28* 1.50*  ?CALCIUM 7.9* 7.9* 7.8*  ?PROT 5.8*   --   --   ?ALBUMIN 2.9*  --   --   ?AST 61*  --   --   ?ALT 40  --   --   ?ALKPHOS 175*  --   --   ?BILITOT 0.7  --   --   ?GFRNONAA 32* 42* 35*  ?ANIONGAP 5 7 3*  ?  ?Hematology ?Recent Labs  ?Lab 04/16/22 ?2022 04/18/22 ?0345 04/19/22 ?2778  ?WBC 5.7 6.1 5.1  ?RBC 3.73* 3.54* 3.68*  ?HGB 11.6* 10.9* 11.1*  ?HCT 35.1* 32.1* 34.0*  ?MCV 94.1 90.7 92.4  ?MCH 31.1 30.8 30.2  ?MCHC 33.0 34.0 32.6  ?RDW 13.3 13.2 13.2  ?PLT 132* 115* 119*  ? ?BNP ?Recent Labs  ?Lab 04/16/22 ?2022  ?BNP 549.2*  ?  ?DDimer  ?Recent Labs  ?Lab 04/17/22 ?0209  ?DDIMER 2.98*  ?  ? ?Radiology  ?ECHOCARDIOGRAM COMPLETE ? ?Result Date: 04/17/2022 ?   ECHOCARDIOGRAM REPORT   Patient Name:   [name] Date of Exam: 04/17/2022 Medical Rec #:  [id number]     Height:       62.0 in Accession #:    [id number]    Weight:       163.0 lb Date of Birth:  Nov 29, 1941     BSA:          1.753 m? Patient Age:    81 years      BP:           169/75 mmHg Patient Gender: F             HR:           84 bpm. Exam Location:  Inpatient Procedure: 2D Echo, Color Doppler and Cardiac Doppler                                 MODIFIED REPORT:  This report was modified by Lyman Bishop MD on 04/17/2022 due to Added details                            regarding aortic stenosis.  Indications:     CHF-Acute Systolic 619.50 / D32.67  History:         Patient has prior history  of Echocardiogram examinations, most                  recent 05/08/2020. Cardiomyopathy, CAD and Previous Myocardial                  Infarction, COPD, Arrythmias:Atrial Flutter; Risk                  Factors:Hypertension, Diabetes and Dyslipidemia.  Sonographer:     Bernadene Person RDCS Referring Phys:  [place] Diagnosing Phys: Lyman Bishop MD IMPRESSIONS  1. Left ventricular ejection fraction, by estimation, is 60 to 65%. The left ventricle has normal function. The left ventricle has no regional wall motion abnormalities. There is mild left ventricular hypertrophy. Left ventricular diastolic  function could not be evaluated.  2. Right ventricular systolic function is normal. The right ventricular size is normal. There is normal pulmonary artery systolic pressure. The estimated right ventricular systolic pressure is 08.6 mmHg.  3. A small pericardial effusion is present. The pericardial effusion is circumferential. There is no evidence of cardiac tamponade.  4. The mitral valve is abnormal. Trivial mitral valve regurgitation.  5. The aortic valve was not well visualized. There is mild calcification of the aortic valve. Aortic valve regurgitation is not visualized. Mild to moderate aortic valve stenosis. Aortic valve area, by VTI measures 1.28 cm?Marland Kitchen Aortic valve mean gradient measures 17.0 mmHg. Aortic valve Vmax measures 2.65 m/s. DI is 0.41.  6. The inferior vena cava is normal in size with <50% respiratory variability, suggesting right atrial pressure of 8 mmHg. Comparison(s): Changes from prior study are noted. 05/08/2020: LVEF 55-60%, small pericardial effusion. FINDINGS  Left Ventricle: Left ventricular ejection fraction, by estimation, is 60 to 65%. The left ventricle has normal function. The left ventricle has no regional wall motion abnormalities. The left ventricular internal cavity size was normal in size. There is  mild left ventricular hypertrophy. Left ventricular diastolic function could not be evaluated due to atrial flutter. Left ventricular diastolic function could not be evaluated. Right Ventricle: The right ventricular size is normal. No increase in right ventricular wall thickness. Right ventricular systolic function is normal. There is normal pulmonary artery systolic pressure. The tricuspid regurgitant velocity is 1.93 m/s, and  with an assumed right atrial pressure of 3 mmHg, the estimated right ventricular systolic pressure is 76.1 mmHg. Left Atrium: Left atrial size was normal in size. Right Atrium: Right atrial size was normal in size. Pericardium: A small pericardial effusion is  present. The pericardial effusion is circumferential. There is no evidence of cardiac tamponade. Mitral Valve: The mitral valve is abnormal. There is moderate calcification of the anterior mitral valve leaf

## 2022-04-20 DIAGNOSIS — I5033 Acute on chronic diastolic (congestive) heart failure: Secondary | ICD-10-CM | POA: Diagnosis not present

## 2022-04-20 LAB — BASIC METABOLIC PANEL
Anion gap: 8 (ref 5–15)
BUN: 20 mg/dL (ref 8–23)
CO2: 30 mmol/L (ref 22–32)
Calcium: 7.8 mg/dL — ABNORMAL LOW (ref 8.9–10.3)
Chloride: 101 mmol/L (ref 98–111)
Creatinine, Ser: 1.63 mg/dL — ABNORMAL HIGH (ref 0.44–1.00)
GFR, Estimated: 32 mL/min — ABNORMAL LOW (ref >60)
Glucose, Bld: 90 mg/dL (ref 70–99)
Potassium: 4.1 mmol/L (ref 3.5–5.1)
Sodium: 139 mmol/L (ref 135–145)

## 2022-04-20 LAB — GLUCOSE, CAPILLARY
Glucose-Capillary: 160 mg/dL — ABNORMAL HIGH (ref 70–99)
Glucose-Capillary: 166 mg/dL — ABNORMAL HIGH (ref 70–99)
Glucose-Capillary: 201 mg/dL — ABNORMAL HIGH (ref 70–99)
Glucose-Capillary: 229 mg/dL — ABNORMAL HIGH (ref 70–99)
Glucose-Capillary: 83 mg/dL (ref 70–99)

## 2022-04-20 MED ORDER — LIDOCAINE 5 % EX PTCH
1.0000 | MEDICATED_PATCH | CUTANEOUS | Status: DC
  Administered 2022-04-20 – 2022-04-21 (×2): 1 via TRANSDERMAL
  Filled 2022-04-20 (×2): qty 1

## 2022-04-20 MED ORDER — HYDROXYZINE HCL 10 MG PO TABS
10.0000 mg | ORAL_TABLET | Freq: Once | ORAL | Status: AC | PRN
  Administered 2022-04-20: 10 mg via ORAL
  Filled 2022-04-20: qty 1

## 2022-04-20 NOTE — Plan of Care (Signed)

## 2022-04-20 NOTE — Discharge Instructions (Signed)
Information on my medicine - ELIQUIS? (apixaban) ? ?This medication education was reviewed with me or my healthcare representative as part of my discharge preparation.  ? ?Why was Eliquis? prescribed for you? ?Eliquis? was prescribed for you to reduce the risk of forming blood clots that can cause a stroke if you have a medical condition called atrial fibrillation (a type of irregular heartbeat) OR to reduce the risk of a blood clots forming after orthopedic surgery. ? ?What do You need to know about Eliquis? ? ?Take your Eliquis? TWICE DAILY - one tablet in the morning and one tablet in the evening with or without food.  It would be best to take the doses about the same time each day. ? ?If you have difficulty swallowing the tablet whole please discuss with your pharmacist how to take the medication safely. ? ?Take Eliquis? exactly as prescribed by your doctor and DO NOT stop taking Eliquis? without talking to the doctor who prescribed the medication.  Stopping may increase your risk of developing a new clot or stroke.  Refill your prescription before you run out. ? ?After discharge, you should have regular check-up appointments with your healthcare provider that is prescribing your Eliquis?.  In the future your dose may need to be changed if your kidney function or weight changes by a significant amount or as you get older. ? ?What do you do if you miss a dose? ?If you miss a dose, take it as soon as you remember on the same day and resume taking twice daily.  Do not take more than one dose of ELIQUIS at the same time. ? ?Important Safety Information ?A possible side effect of Eliquis? is bleeding. You should call your healthcare provider right away if you experience any of the following: ?Bleeding from an injury or your nose that does not stop. ?Unusual colored urine (red or dark [place]) or unusual colored stools (red or black). ?Unusual bruising for unknown reasons. ?A serious fall or if you hit your head (even  if there is no bleeding). ? ?Some medicines may interact with Eliquis? and might increase your risk of bleeding or clotting while on Eliquis?. To help avoid this, consult your healthcare provider or pharmacist prior to using any new prescription or non-prescription medications, including herbals, vitamins, non-steroidal anti-inflammatory drugs (NSAIDs) and supplements. ? ?This website has more information on Eliquis? (apixaban): http://www.eliquis.com/eliquis/home ?  ?

## 2022-04-20 NOTE — Progress Notes (Signed)
?PROGRESS NOTE ? ? ? ?[name]  XBJ:[id number] DOB: 05/31/1941 DOA: 04/16/2022 ?PCP: Isaac Bliss, Rayford Halsted, MD  ?[name] is a 81 y.o. female with medical history significant of AML (2015); chronic back pain; COPD; CAD; chronic diastolic CHF; DM; HTN; HLD; and breast cancer presenting with SOB.  She began having SOB earlier this week.  She saw her PCP yesterday and her torsemide dose was increased, despite this continued to have dyspnea on exertion and orthopnea, presented to the ED. ?-Labs noted creatinine of 1.6, BNP of 549, troponin was negative ?-CT chest noted moderate right pleural effusion, left small left effusion, right lower lobe pulmonary nodule complex lesion in the pancreatic head and ductal dilation which is unchanged and cirrhotic liver findings with indeterminate hypodensity ? ?Subjective: ?-Feels better today, having more abdominal discomfort and low back pain ? ?Assessment and Plan: ? ?Acute on chronic diastolic CHF ?-Clinically improving,, cards following ?-2D echo with preserved EF, mild to moderate aortic stenosis ?-Urine output and weight are inaccurate, clinically appears close to euvolemic ?-Cirrhosis, hypoalbuminemia also contributing to fluid overload ?-Now on torsemide ?-Home tomorrow if abdominal and low back pain improving ? ?Low back pain, ?-Suspect related to diffuse lytic and sclerotic bone lesions ?-Has chronic back pain with some worsening now ?-Add lidocaine patch, Voltaren gel, continue hydrocodone ? ?Chronic diarrhea ?-CT with some pancreatic stranding and previously noted and stable pancreatic head mass s/p EUS and negative cytology ?-Normal lipase, C. difficile and GI pathogen panel was negative ?-Previously on Creon but she reports not taking regularly because it causes constipation,  ?-Suspect malabsorption, improving, Imodium x1 given 5/12, discussed using Creon few times a week ?  ?H/o AML and recent breast cancer ?-Imaging this admission with concern for  sclerotic mets ?-Oncology following, previously noted on imaging studies ?-Continue letrozole ?-At this time considered to be in AML remission and on surveillance ?-Follow-up with Dr. [name] ?  ?Seizure d/o ?-Continue Keppra ?  ?COPD ?-Uses Breo prn ?  ?HTN ?-Continue amlodipine, metoprolol ?-Will also add prn hydralazine ?  ?HLD ?-Continue Lipitor ?  ?DM ?-Last A1c was 5.2 ?-Continue Toujeo (Semglee per formulary) ?-Will cover with moderate-scale SSI for now ?  ?CAD ?-s/p stent ?-Too high risk for CABG in 2018 ?-Continue Plavix, Imdur ?-Stable at this time ?  ?Pulmonary nodule ?-New 4 mm nodule ?-Given 20 pack-year history, would recommend f/u with repeat CT scan ?  ?Stage 3b CKD ?-Improving with diuresis, monitor ? ?  ?Advance Care Planning:   Code Status: DNR  ?  ?Consults: Cardiology; oncology; CHF navigator; [facility] team; PT/OT ?  ?DVT Prophylaxis: Lovenox ?Disposition Plan: Home tomorrow if stable ? ?Consultants:  ?Cardiology ? ?Procedures:  ? ?Antimicrobials:  ? ? ?Objective: ?Vitals:  ? 04/19/22 0921 04/19/22 1925 04/20/22 0440 04/20/22 0746  ?BP:  (!) 167/60 (!) 130/40 (!) 148/54  ?Pulse: 69 74 (!) 59 69  ?Resp:    18  ?Temp:  98 ?F (36.7 ?C) 97.8 ?F (36.6 ?C) 98.1 ?F (36.7 ?C)  ?TempSrc:  Oral Oral Oral  ?SpO2:  97% 94% 94%  ?Weight:   69 kg   ?Height:      ? ? ?Intake/Output Summary (Last 24 hours) at 04/20/2022 1123 ?Last data filed at 04/20/2022 0747 ?Gross per 24 hour  ?Intake 1200 ml  ?Output 1800 ml  ?Net -600 ml  ? ?Filed Weights  ? 04/18/22 0629 04/19/22 0413 04/20/22 0440  ?Weight: 68.3 kg 68.8 kg 69 kg  ? ? ?Examination: ? ?  General exam: Elderly chronically ill female laying in bed, uncomfortable appearing, AAOx3 ?CVS: S1-S2, regular rate rhythm ?Lungs: Decreased breath sounds at the bases otherwise clear ?Abdomen: Soft, nontender, bowel sounds present ?Extremities: No edema  ?Skin: No rashes ?Psychiatry:  Mood & affect appropriate.  ? ? ? ?Data Reviewed:  ? ?CBC: ?Recent Labs  ?Lab 04/16/22 ?2022  04/18/22 ?0345 04/19/22 ?6160  ?WBC 5.7 6.1 5.1  ?NEUTROABS 3.8  --   --   ?HGB 11.6* 10.9* 11.1*  ?HCT 35.1* 32.1* 34.0*  ?MCV 94.1 90.7 92.4  ?PLT 132* 115* 119*  ? ?Basic Metabolic Panel: ?Recent Labs  ?Lab 04/16/22 ?2022 04/18/22 ?0345 04/19/22 ?7371 04/20/22 ?0440  ?NA 143 140 140 139  ?K 3.9 2.9* 4.4 4.1  ?CL 113* 105 106 101  ?CO2 '25 28 31 30  '$ ?GLUCOSE 256* 53* 78 90  ?BUN '13 12 15 20  '$ ?CREATININE 1.63* 1.28* 1.50* 1.63*  ?CALCIUM 7.9* 7.9* 7.8* 7.8*  ?MG 1.9  --   --   --   ? ?GFR: ?Estimated Creatinine Clearance: 25.1 mL/min (A) (by C-G formula based on SCr of 1.63 mg/dL (H)). ?Liver Function Tests: ?Recent Labs  ?Lab 04/16/22 ?2022  ?AST 61*  ?ALT 40  ?ALKPHOS 175*  ?BILITOT 0.7  ?PROT 5.8*  ?ALBUMIN 2.9*  ? ?Recent Labs  ?Lab 04/17/22 ?0440  ?LIPASE 17  ? ?No results for input(s): AMMONIA in the last 168 hours. ?Coagulation Profile: ?No results for input(s): INR, PROTIME in the last 168 hours. ?Cardiac Enzymes: ?No results for input(s): CKTOTAL, CKMB, CKMBINDEX, TROPONINI in the last 168 hours. ?BNP (last 3 results) ?No results for input(s): PROBNP in the last 8760 hours. ?HbA1C: ?No results for input(s): HGBA1C in the last 72 hours. ? ?CBG: ?Recent Labs  ?Lab 04/19/22 ?1122 04/19/22 ?1641 04/19/22 ?2107 04/20/22 ?0038 04/20/22 ?0626  ?GLUCAP 282* 384* 466* 166* 83  ? ?Lipid Profile: ?No results for input(s): CHOL, HDL, LDLCALC, TRIG, CHOLHDL, LDLDIRECT in the last 72 hours. ?Thyroid Function Tests: ?No results for input(s): TSH, T4TOTAL, FREET4, T3FREE, THYROIDAB in the last 72 hours. ?Anemia Panel: ?No results for input(s): VITAMINB12, FOLATE, FERRITIN, TIBC, IRON, RETICCTPCT in the last 72 hours. ?Urine analysis: ?   ?Component Value Date/Time  ? COLORURINE STRAW (A) 12/05/2021 2233  ? APPEARANCEUR CLEAR 12/05/2021 2233  ? LABSPEC 1.011 12/05/2021 2233  ? LABSPEC 1.015 08/04/2014 1439  ? PHURINE 5.0 12/05/2021 2233  ? GLUCOSEU >=500 (A) 12/05/2021 2233  ? GLUCOSEU Negative 08/04/2014 1439  ? HGBUR  SMALL (A) 12/05/2021 2233  ? HGBUR negative 10/10/2010 1607  ? [place] NEGATIVE 12/05/2021 2233  ? BILIRUBINUR Negative 08/04/2014 1439  ? [place] NEGATIVE 12/05/2021 2233  ? PROTEINUR 30 (A) 12/05/2021 2233  ? UROBILINOGEN 0.2 08/04/2014 1439  ? NITRITE NEGATIVE 12/05/2021 2233  ? LEUKOCYTESUR NEGATIVE 12/05/2021 2233  ? LEUKOCYTESUR Negative 08/04/2014 1439  ? ?Sepsis Labs: ?'@LABRCNTIP'$ (procalcitonin:4,lacticidven:4) ? ?) ?Recent Results (from the past 240 hour(s))  ?Gastrointestinal Panel by PCR , Stool     Status: None  ? Collection Time: 04/17/22  9:29 AM  ? Specimen: Stool  ?Result Value Ref Range Status  ? Campylobacter species NOT DETECTED NOT DETECTED Final  ? Plesimonas shigelloides NOT DETECTED NOT DETECTED Final  ? Salmonella species NOT DETECTED NOT DETECTED Final  ? Yersinia enterocolitica NOT DETECTED NOT DETECTED Final  ? Vibrio species NOT DETECTED NOT DETECTED Final  ? Vibrio cholerae NOT DETECTED NOT DETECTED Final  ? Enteroaggregative E coli (EAEC) NOT DETECTED NOT DETECTED Final  ?  Enteropathogenic E coli (EPEC) NOT DETECTED NOT DETECTED Final  ? Enterotoxigenic E coli (ETEC) NOT DETECTED NOT DETECTED Final  ? Shiga like toxin producing E coli (STEC) NOT DETECTED NOT DETECTED Final  ? Shigella/Enteroinvasive E coli (EIEC) NOT DETECTED NOT DETECTED Final  ? Cryptosporidium NOT DETECTED NOT DETECTED Final  ? Cyclospora cayetanensis NOT DETECTED NOT DETECTED Final  ? Entamoeba histolytica NOT DETECTED NOT DETECTED Final  ? Giardia lamblia NOT DETECTED NOT DETECTED Final  ? Adenovirus F40/41 NOT DETECTED NOT DETECTED Final  ? Astrovirus NOT DETECTED NOT DETECTED Final  ? Norovirus GI/GII NOT DETECTED NOT DETECTED Final  ? Rotavirus A NOT DETECTED NOT DETECTED Final  ? Sapovirus (I, II, IV, and V) NOT DETECTED NOT DETECTED Final  ?  Comment: Performed at [facility], [address] [place], [place] [address]  ?C Difficile Quick Screen w PCR reflex     Status: None  ? Collection Time:  04/17/22  9:29 AM  ? Specimen: STOOL  ?Result Value Ref Range Status  ? C Diff antigen NEGATIVE NEGATIVE Final  ? C Diff toxin NEGATIVE NEGATIVE Final  ? C Diff interpretation No C. difficile detected.  Final

## 2022-04-20 NOTE — Progress Notes (Signed)
?Cardiology Progress Note  ?Patient ID: [name] ?MRN: [id number] ?DOB: [birth year] ?Date of Encounter: 04/20/2022 ? ?Primary Cardiologist: Mertie Moores, MD ? ?Subjective  ? ?Chief Complaint: Back Pain ? ?HPI: Breathing well.  Euvolemic.  Back on torsemide.  Reports back pain. ? ?ROS:  ?All other ROS reviewed and negative. Pertinent positives noted in the HPI.    ? ?Inpatient Medications  ?Scheduled Meds: ? apixaban  2.5 mg Oral BID  ? atorvastatin  40 mg Oral Daily  ? Chlorhexidine Gluconate Cloth  6 each Topical Daily  ? clopidogrel  75 mg Oral QPM  ? empagliflozin  25 mg Oral Daily  ? feeding supplement  237 mL Oral BID BM  ? insulin aspart  0-5 Units Subcutaneous QHS  ? insulin aspart  0-9 Units Subcutaneous TID WC  ? insulin glargine-yfgn  8 Units Subcutaneous QHS  ? isosorbide mononitrate  30 mg Oral Daily  ? letrozole  2.5 mg Oral Daily  ? levETIRAcetam  500 mg Oral BID  ? magnesium oxide  400 mg Oral BID  ? metoprolol succinate  25 mg Oral Daily  ? sacubitril-valsartan  1 tablet Oral BID  ? sodium chloride flush  10-40 mL Intracatheter Q12H  ? sodium chloride flush  3 mL Intravenous Q12H  ? torsemide  40 mg Oral Daily  ? ?Continuous Infusions: ? ?PRN Meds: ?acetaminophen **OR** acetaminophen, hydrALAZINE, HYDROcodone-acetaminophen, LORazepam, ondansetron **OR** ondansetron (ZOFRAN) IV, sodium chloride flush, traZODone  ? ?Vital Signs  ? ?Vitals:  ? 04/19/22 0921 04/19/22 1925 04/20/22 0440 04/20/22 0746  ?BP:  (!) 167/60 (!) 130/40 (!) 148/54  ?Pulse: 69 74 (!) 59 69  ?Resp:    18  ?Temp:  98 ?F (36.7 ?C) 97.8 ?F (36.6 ?C) 98.1 ?F (36.7 ?C)  ?TempSrc:  Oral Oral Oral  ?SpO2:  97% 94% 94%  ?Weight:   69 kg   ?Height:      ? ? ?Intake/Output Summary (Last 24 hours) at 04/20/2022 0758 ?Last data filed at 04/20/2022 0747 ?Gross per 24 hour  ?Intake 1440 ml  ?Output 1800 ml  ?Net -360 ml  ? ? ?  04/20/2022  ?  4:40 AM 04/19/2022  ?  4:13 AM 04/18/2022  ?  6:29 AM  ?Last 3 Weights  ?Weight (lbs) 152 lb 3.2 oz 151 lb  11.2 oz 150 lb 8 oz  ?Weight (kg) 69.037 kg 68.811 kg 68.266 kg  ?   ? ?Telemetry  ?Overnight telemetry shows SB 50-60 bpm, which I personally reviewed.  ? ? ?Physical Exam  ? ?Vitals:  ? 04/19/22 0921 04/19/22 1925 04/20/22 0440 04/20/22 0746  ?BP:  (!) 167/60 (!) 130/40 (!) 148/54  ?Pulse: 69 74 (!) 59 69  ?Resp:    18  ?Temp:  98 ?F (36.7 ?C) 97.8 ?F (36.6 ?C) 98.1 ?F (36.7 ?C)  ?TempSrc:  Oral Oral Oral  ?SpO2:  97% 94% 94%  ?Weight:   69 kg   ?Height:      ?  ?Intake/Output Summary (Last 24 hours) at 04/20/2022 0758 ?Last data filed at 04/20/2022 0747 ?Gross per 24 hour  ?Intake 1440 ml  ?Output 1800 ml  ?Net -360 ml  ?  ? ?  04/20/2022  ?  4:40 AM 04/19/2022  ?  4:13 AM 04/18/2022  ?  6:29 AM  ?Last 3 Weights  ?Weight (lbs) 152 lb 3.2 oz 151 lb 11.2 oz 150 lb 8 oz  ?Weight (kg) 69.037 kg 68.811 kg 68.266 kg  ?  Body mass  index is 27.84 kg/m?.  ?General: Well nourished, well developed, in no acute distress ?Head: Atraumatic, normal size  ?Eyes: PEERLA, EOMI  ?Neck: Supple, no JVD ?Endocrine: No thryomegaly ?Cardiac: Normal S1, S2; irregular rhythm 2/6 SEM ?Lungs: Expiratory wheezing noted ?Abd: Soft, nontender, no hepatomegaly  ?Ext: No edema, pulses 2+ ?Musculoskeletal: No deformities, BUE and BLE strength normal and equal ?Skin: Warm and dry, no rashes   ?Neuro: Alert and oriented to person, place, time, and situation, CNII-XII grossly intact, no focal deficits  ?Psych: Normal mood and affect  ? ?Labs  ?High Sensitivity Troponin:   ?Recent Labs  ?Lab 04/17/22 ?0209 04/17/22 ?0423  ?TROPONINIHS 16 15  ?   ?Cardiac EnzymesNo results for input(s): TROPONINI in the last 168 hours. No results for input(s): TROPIPOC in the last 168 hours.  ?Chemistry ?Recent Labs  ?Lab 04/16/22 ?2022 04/18/22 ?0345 04/19/22 ?4193 04/20/22 ?0440  ?NA 143 140 140 139  ?K 3.9 2.9* 4.4 4.1  ?CL 113* 105 106 101  ?CO2 '25 28 31 30  '$ ?GLUCOSE 256* 53* 78 90  ?BUN '13 12 15 20  '$ ?CREATININE 1.63* 1.28* 1.50* 1.63*  ?CALCIUM 7.9* 7.9* 7.8* 7.8*   ?PROT 5.8*  --   --   --   ?ALBUMIN 2.9*  --   --   --   ?AST 61*  --   --   --   ?ALT 40  --   --   --   ?ALKPHOS 175*  --   --   --   ?BILITOT 0.7  --   --   --   ?GFRNONAA 32* 42* 35* 32*  ?ANIONGAP 5 7 3* 8  ?  ?Hematology ?Recent Labs  ?Lab 04/16/22 ?2022 04/18/22 ?0345 04/19/22 ?7902  ?WBC 5.7 6.1 5.1  ?RBC 3.73* 3.54* 3.68*  ?HGB 11.6* 10.9* 11.1*  ?HCT 35.1* 32.1* 34.0*  ?MCV 94.1 90.7 92.4  ?MCH 31.1 30.8 30.2  ?MCHC 33.0 34.0 32.6  ?RDW 13.3 13.2 13.2  ?PLT 132* 115* 119*  ? ?BNP ?Recent Labs  ?Lab 04/16/22 ?2022  ?BNP 549.2*  ?  ?DDimer  ?Recent Labs  ?Lab 04/17/22 ?0209  ?DDIMER 2.98*  ?  ? ?Radiology  ?No results found. ? ?Cardiac Studies  ?TTE 04/17/2022 ? 1. Left ventricular ejection fraction, by estimation, is 60 to 65%. The  ?left ventricle has normal function. The left ventricle has no regional  ?wall motion abnormalities. There is mild left ventricular hypertrophy.  ?Left ventricular diastolic function  ?could not be evaluated.  ? 2. Right ventricular systolic function is normal. The right ventricular  ?size is normal. There is normal pulmonary artery systolic pressure. The  ?estimated right ventricular systolic pressure is 40.9 mmHg.  ? 3. A small pericardial effusion is present. The pericardial effusion is  ?circumferential. There is no evidence of cardiac tamponade.  ? 4. The mitral valve is abnormal. Trivial mitral valve regurgitation.  ? 5. The aortic valve was not well visualized. There is mild calcification  ?of the aortic valve. Aortic valve regurgitation is not visualized. Mild to  ?moderate aortic valve stenosis. Aortic valve area, by VTI measures 1.28  ?cm?Marland Kitchen Aortic valve mean gradient  ?measures 17.0 mmHg. Aortic valve Vmax measures 2.65 m/s. DI is 0.41.  ? 6. The inferior vena cava is normal in size with <50% respiratory  ?variability, suggesting right atrial pressure of 8 mmHg.  ? ?Patient Profile  ?MARY-ANNE POLIZZI is a 81 y.o. female with CAD, diastolic heart failure, diabetes, atrial  flutter who was admitted on 04/16/2022 with acute on chronic diastolic heart failure. ? ?Assessment & Plan  ? ?#Acute on chronic diastolic heart failure ?-Doing well.  Back on torsemide 40 mg daily. ?-Jardiance 10 mg daily added. ?-Entresto added Friday by primary cardiologist.  We will continue this.  On metoprolol succinate 25 mg daily. ?-Okay for discharge from heart failure standpoint. ? ?#Paroxysmal atrial fibrillation ?-Diagnosed on admission.  On Eliquis 2.5 mg twice daily.  On metoprolol succinate.  We will continue these. ? ?#Sinus bradycardia ?#First-degree AV block ?-No symptoms from this.  Okay to continue metoprolol. ? ?#CAD ?-Multivessel CAD on left heart cath in 2018.  Not a CABG candidate.  Underwent PCI to the RCA.  No symptoms of angina.  On Imdur.  On a beta-blocker.  We will continue medical therapy for now. ? ?[facility] will sign off.   ?Medication Recommendations: Metoprolol succinate 25 mg daily, Entresto 24-26 mg twice daily, stop amlodipine, continue Eliquis 2.5 mg twice daily, Lipitor 40 mg daily, Plavix 75 mg daily, torsemide 40 mg daily ?Other recommendations (labs, testing, etc): None ?Follow up as an outpatient: We will arrange outpatient heart failure follow-up appointment in 1 week. ? ?For questions or updates, please contact [facility] ?Please consult [email or web address] for contact info under  ? ? ?Signed, ?[place] T. Audie Box, MD, [facility] ?[place]  ?04/20/2022 7:58 AM  ? ?

## 2022-04-21 ENCOUNTER — Other Ambulatory Visit (HOSPITAL_COMMUNITY): Payer: Self-pay

## 2022-04-21 ENCOUNTER — Encounter: Payer: Self-pay | Admitting: Oncology

## 2022-04-21 DIAGNOSIS — I5033 Acute on chronic diastolic (congestive) heart failure: Secondary | ICD-10-CM | POA: Diagnosis not present

## 2022-04-21 LAB — CBC
HCT: 31.6 % — ABNORMAL LOW (ref 36.0–46.0)
Hemoglobin: 10.1 g/dL — ABNORMAL LOW (ref 12.0–15.0)
MCH: 29.5 pg (ref 26.0–34.0)
MCHC: 32 g/dL (ref 30.0–36.0)
MCV: 92.4 fL (ref 80.0–100.0)
Platelets: 101 10*3/uL — ABNORMAL LOW (ref 150–400)
RBC: 3.42 MIL/uL — ABNORMAL LOW (ref 3.87–5.11)
RDW: 13.2 % (ref 11.5–15.5)
WBC: 4.3 10*3/uL (ref 4.0–10.5)
nRBC: 0 % (ref 0.0–0.2)

## 2022-04-21 LAB — BASIC METABOLIC PANEL
Anion gap: 6 (ref 5–15)
BUN: 27 mg/dL — ABNORMAL HIGH (ref 8–23)
CO2: 29 mmol/L (ref 22–32)
Calcium: 7.5 mg/dL — ABNORMAL LOW (ref 8.9–10.3)
Chloride: 102 mmol/L (ref 98–111)
Creatinine, Ser: 1.76 mg/dL — ABNORMAL HIGH (ref 0.44–1.00)
GFR, Estimated: 29 mL/min — ABNORMAL LOW (ref >60)
Glucose, Bld: 210 mg/dL — ABNORMAL HIGH (ref 70–99)
Potassium: 4 mmol/L (ref 3.5–5.1)
Sodium: 137 mmol/L (ref 135–145)

## 2022-04-21 LAB — GLUCOSE, CAPILLARY
Glucose-Capillary: 188 mg/dL — ABNORMAL HIGH (ref 70–99)
Glucose-Capillary: 276 mg/dL — ABNORMAL HIGH (ref 70–99)

## 2022-04-21 MED ORDER — EMPAGLIFLOZIN 25 MG PO TABS
25.0000 mg | ORAL_TABLET | Freq: Every day | ORAL | 0 refills | Status: DC
  Filled 2022-04-21: qty 30, 30d supply, fill #0

## 2022-04-21 MED ORDER — TORSEMIDE 20 MG PO TABS
40.0000 mg | ORAL_TABLET | Freq: Every day | ORAL | 0 refills | Status: DC
  Filled 2022-04-21: qty 60, 30d supply, fill #0

## 2022-04-21 MED ORDER — ISOSORBIDE MONONITRATE ER 30 MG PO TB24: 30.0000 mg | ORAL_TABLET | Freq: Every day | ORAL | Status: DC

## 2022-04-21 MED ORDER — SACUBITRIL-VALSARTAN 24-26 MG PO TABS
1.0000 | ORAL_TABLET | Freq: Two times a day (BID) | ORAL | 0 refills | Status: DC
  Filled 2022-04-21: qty 60, 30d supply, fill #0

## 2022-04-21 MED ORDER — HEPARIN SOD (PORK) LOCK FLUSH 100 UNIT/ML IV SOLN
500.0000 [IU] | INTRAVENOUS | Status: AC | PRN
  Administered 2022-04-21: 500 [IU]

## 2022-04-21 MED ORDER — LIDOCAINE 4 % EX PTCH
1.0000 | MEDICATED_PATCH | CUTANEOUS | 0 refills | Status: DC
  Filled 2022-04-21: qty 30, 30d supply, fill #0

## 2022-04-21 MED ORDER — METOPROLOL SUCCINATE ER 25 MG PO TB24
25.0000 mg | ORAL_TABLET | Freq: Every day | ORAL | 0 refills | Status: DC
  Filled 2022-04-21: qty 30, 30d supply, fill #0

## 2022-04-21 MED ORDER — APIXABAN 2.5 MG PO TABS
2.5000 mg | ORAL_TABLET | Freq: Two times a day (BID) | ORAL | 0 refills | Status: DC
  Filled 2022-04-21: qty 60, 30d supply, fill #0

## 2022-04-21 NOTE — TOC Benefit Eligibility Note (Signed)
Patient Advocate Encounter ?  ?Insurance verification completed.   ?  ?The patient is currently admitted and upon discharge could be taking ENTRESTO 24-26 MG. ?  ?The current 30 day co-pay is, $45.  ? ?The patient is insured through [place]. ? ? ?   ?

## 2022-04-21 NOTE — Progress Notes (Signed)
Patient is alert and oriented x 4. Ambulated in the hallway this morning with Physical therapy. Denies pain. Ate breakfast and lunch. Pt is ready to go home. No signs nor symptoms of distress noted. ?

## 2022-04-21 NOTE — Plan of Care (Signed)

## 2022-04-21 NOTE — Progress Notes (Signed)
Mobility Specialist Progress Note: ? ? 04/21/22 0930  ?Mobility  ?Activity Ambulated with assistance in hallway  ?Level of Assistance Standby assist, set-up cues, supervision of patient - no hands on  ?Assistive Device None  ?Distance Ambulated (ft) 250 ft  ?Activity Response Tolerated well  ?$Mobility charge 1 Mobility  ? ?Pt agreeable to mobility session. No physical assistance required. C/o back pain (chronic), otherwise asx. Pt left sitting EOB with all needs met.  ? ?[name] ?Acute Rehab ?Secure Chat or ?Office Phone: [phone] ? ?

## 2022-04-21 NOTE — TOC Transition Note (Signed)
Transition of Care (TOC) - CM/SW Discharge Note ? ? ?Patient Details  ?Name: [name] ?MRN: [id number] ?Date of Birth: [birth year] ? ?Transition of Care (TOC) CM/SW Contact:  ?Zenon Mayo, RN ?Phone Number: ?04/21/2022, 10:08 AM ? ? ?Clinical Narrative:    ?Patient is for dc today, she states her spouse is coming to pick her up this am , she has no needs.  ? ? ?  ?  ? ? ?Patient Goals and CMS Choice ?  ?  ?  ? ?Discharge Placement ?  ?           ?  ?  ?  ?  ? ?Discharge Plan and Services ?  ?  ?           ?  ?  ?  ?  ?  ?  ?  ?  ?  ?  ? ?Social Determinants of Health (SDOH) Interventions ?Food Insecurity Interventions: Intervention Not Indicated ?Financial Strain Interventions: Intervention Not Indicated ?Housing Interventions: Intervention Not Indicated ?Transportation Interventions: Intervention Not Indicated ? ? ?Readmission Risk Interventions ?   ? View : No data to display.  ?  ?  ?  ? ? ? ? ? ?

## 2022-04-21 NOTE — Discharge Summary (Signed)
Physician Discharge Summary  ?[name] CZY:[id number] DOB: 09/02/1941 DOA: 04/16/2022 ? ?PCP: [name], Catherine Halsted, MD ? ?Admit date: 04/16/2022 ?Discharge date: 04/21/2022 ? ?Time spent: 35 minutes ? ?Recommendations for Outpatient Follow-up:  ?PCP Dr. Jerilee [name] in 1 to 2 weeks, please check BMP at follow-up ?Cardiology Dr. Acie [name] in 1 month ?Oncology Dr. Benay [name] in 2 to 3 weeks ? ? ?Discharge Diagnoses:  ?Principal Problem: ?  Acute on chronic diastolic CHF (congestive heart failure) ([place]) ?Bone metastasis ?  Abdominal pain ?  Hyperlipidemia ?  Essential hypertension ?  Chronic back pain ?  AML (acute myeloblastic leukemia) ([place]) ?  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  Type 2 diabetes mellitus with stage 3 chronic kidney disease, with long-term current use of insulin ([name]) ?  Carcinoma of upper-outer quadrant of left breast in female, estrogen receptor positive ([name]) ?  Pulmonary nodule ?  DNR (do not resuscitate) ?  Paroxysmal atrial fibrillation (HCC) ? ? ?Discharge Condition: Stable ? ?Diet recommendation: Low-sodium, diabetic ? ?Filed Weights  ? 04/19/22 0413 04/20/22 0440 04/21/22 0525  ?Weight: 68.8 kg 69 kg 68.5 kg  ? ? ?History of present illness:  ?[name] is a 81 y.o. female with medical history significant of AML (2015); chronic back pain; COPD; CAD; chronic diastolic CHF; DM; HTN; HLD; and breast cancer presenting with SOB.  She began having SOB earlier this week.  She saw her PCP yesterday and her torsemide dose was increased, despite this continued to have dyspnea on exertion and orthopnea, presented to the ED. ?-Labs noted creatinine of 1.6, BNP of 549, troponin was negative ?-CT chest noted moderate right pleural effusion, left small left effusion, right lower lobe pulmonary nodule complex lesion in the pancreatic head and ductal dilation which is unchanged  and cirrhotic liver findings with indeterminate hypodensity ? ?Hospital Course:  ? ?Acute on chronic diastolic CHF ?-Clinically improved with diuresis, hypoalbuminemia and cirrhosis contributing to volume overload ?-2D echo with preserved EF, mild to moderate aortic stenosis ?-Transitioned back to torsemide at discharge ?  ?Low back pain, ?-Likely related to diffuse lytic and sclerotic bone lesions ?-Has chronic back pain at baseline ?-Added lidocaine patch, continue hydrocodone ?  ?Chronic diarrhea ?-CT with some pancreatic stranding and previously noted and stable pancreatic head mass s/p EUS and negative cytology ?-Normal lipase, C. difficile and GI pathogen panel was negative ?-Previously on Creon but she reports not taking regularly because it causes constipation,  ?-Suspect malabsorption, improving, Imodium x1 given 5/12, discussed using Creon few times a week ?  ?H/o AML and recent breast cancer ?-Imaging this admission with concern for sclerotic mets ?-Oncology following, these findings previously noted on imaging studies ?-Continue letrozole ?-AML is under surveillance ?-Seen by Dr. Benay [name] during this admission, recommended follow-up in the clinic ?  ?Seizure d/o ?-Continue Keppra ?  ?COPD ?-Uses Breo prn ?  ?HTN ?-Continue amlodipine, metoprolol ?  ?HLD ?-Continue Lipitor ?  ?DM ?-Last A1c was 5.2 ?-Continue Toujeo, recommend decreasing dose if she develops hypoglycemia, CBGs have been stable inpatient ?  ?CAD ?-s/p stent ?-Too high risk for CABG in 2018 ?-Continue Plavix, Imdur ?-Stable at this time ?  ?Pulmonary nodule ?-New 4 mm nodule ?-Given 20 pack-year history, would recommend f/u with repeat CT scan ?  ?Stage 3b CKD ?-Improving with diuresis, now stable ? ?Discharge Exam: ?Vitals:  ? 04/21/22 0525 04/21/22 0740  ?BP: (!) 132/52 (!) 163/76  ?  Pulse: 69 73  ?Resp: 18 18  ?Temp: 98 ?F (36.7 ?C) 97.9 ?F (36.6 ?C)  ?SpO2: 96% 97%  ? ?General exam: Elderly chronically ill female laying in bed,  uncomfortable appearing, AAOx3 ?CVS: S1-S2, regular rate rhythm ?Lungs: Decreased breath sounds at the bases otherwise clear ?Abdomen: Soft, nontender, bowel sounds present ?Extremities: No edema  ?Skin: No rashes ?Psychiatry:  Mood & affect appropriate.  ?  ? ?Discharge Instructions ? ? ?Discharge Instructions   ? ? Diet - low sodium heart healthy   Complete by: As directed ?  ? Increase activity slowly   Complete by: As directed ?  ? ?  ? ?Allergies as of 04/21/2022   ?No Known Allergies ?  ? ?  ?Medication List  ?  ? ?STOP taking these medications   ? ?amLODipine 10 MG tablet ?Commonly known as: NORVASC ?  ? ?  ? ?TAKE these medications   ? ?atorvastatin 40 MG tablet ?Commonly known as: LIPITOR ?TAKE 1 TABLET EVERY DAY ?  ?BD SafetyGlide Syringe/Needle 25G X 1" 3 ML Misc ?Generic drug: SYRINGE-NEEDLE (DISP) 3 ML ?Use for B12 injections ?  ?clopidogrel 75 MG tablet ?Commonly known as: PLAVIX ?TAKE 1 TABLET EVERY EVENING ?  ?cyanocobalamin 1000 MCG/ML injection ?Commonly known as: (VITAMIN B-12) ?INJECT 1ML IN THE MUSCLE EVERY 30 DAYS AS DIRECTED ?What changed:  ?how much to take ?how to take this ?when to take this ?additional instructions ?  ?Eliquis 2.5 MG Tabs tablet ?Generic drug: apixaban ?Take 1 tablet (2.5 mg total) by mouth 2 (two) times daily. ?  ?Entresto 24-26 MG ?Generic drug: sacubitril-valsartan ?Take 1 tablet by mouth 2 (two) times daily. ?  ?fluticasone furoate-vilanterol 200-25 MCG/ACT Aepb ?Commonly known as: BREO ELLIPTA ?Inhale 1 puff into the lungs as needed (wheezing SOB). ?  ?HYDROcodone-acetaminophen 5-325 MG tablet ?Commonly known as: Norco ?Take 1 tablet by mouth every 6 (six) hours as needed for moderate pain. ?  ?Insulin Pen Needle 32G X 4 MM Misc ?1 Device by Does not apply route in the morning, at noon, in the evening, and at bedtime. ?  ?isosorbide mononitrate 30 MG 24 hr tablet ?Commonly known as: IMDUR ?Take 1 tablet (30 mg total) by mouth daily. TAKE 1 AND 1/2 TABLETS EVERY DAY ?   ?Jardiance 25 MG Tabs tablet ?Generic drug: empagliflozin ?Take 1 tablet (25 mg total) by mouth daily. ?  ?letrozole 2.5 MG tablet ?Commonly known as: [place] ?Take 1 tablet (2.5 mg total) by mouth daily. ?  ?levETIRAcetam 500 MG tablet ?Commonly known as: Keppra ?Take 1 tablet (500 mg total) by mouth 2 (two) times daily. ?  ?lipase/protease/amylase 36000 UNITS Cpep capsule ?Commonly known as: Creon ?Take 2 capsules (72,000 Units total) by mouth 3 (three) times daily with meals. ?What changed:  ?when to take this ?reasons to take this ?  ?LORazepam 0.5 MG tablet ?Commonly known as: ATIVAN ?TAKE 1 TABLET BY MOUTH EVERY 8 HOURS AS NEEDED FOR ANXIETY(DO NOT EXCEED MORE THAN 2 TABS IN 24 HRS) ?What changed:  ?how much to take ?how to take this ?when to take this ?reasons to take this ?additional instructions ?  ?MAGNESIUM OXIDE 400 PO ?Take 400 mg by mouth 2 (two) times daily. ?  ?metoprolol succinate 25 MG 24 hr tablet ?Commonly known as: TOPROL-XL ?Take 1 tablet (25 mg total) by mouth daily. Take with or immediately following a meal. ?What changed:  ?medication strength ?how much to take ?additional instructions ?  ?nitroGLYCERIN 0.4 MG SL tablet ?  Commonly known as: NITROSTAT ?PLACE 1 TABLET (0.4 MG TOTAL) UNDER THE TONGUE EVERY 5 (FIVE) MINUTES AS NEEDED FOR CHEST PAIN. ?  ?polyethylene glycol 17 g packet ?Commonly known as: MIRALAX / GLYCOLAX ?Take 17 g by mouth daily as needed for mild constipation. ?  ?potassium chloride 10 MEQ tablet ?Commonly known as: KLOR-CON ?TAKE 1 TABLET EVERY DAY ?  ?torsemide 20 MG tablet ?Commonly known as: DEMADEX ?Take 2 tablets (40 mg total) by mouth daily. ?What changed: how much to take ?  ?Toujeo Max SoloStar 300 UNIT/ML Solostar Pen ?Generic drug: insulin glargine (2 Unit Dial) ?Inject 16 Units into the skin at bedtime. ?  ?True Metrix Blood Glucose Test test strip ?Generic drug: glucose blood ?TEST BLOOD SUGAR THREE TIMES DAILY ?What changed:  ?when to take this ?additional  instructions ?  ?True Metrix Go Glucose Meter w/Device Kit ?USE TO CHECK BLOOD SUGARS THREE TIMES A DAY AND PRN ?What changed:  ?when to take this ?additional instructions ?  ? ?  ? ?No Known Allergies ? Follow-up

## 2022-04-21 NOTE — Plan of Care (Signed)
?Problem: Education: ?Goal: Ability to demonstrate management of disease process will improve ?04/21/2022 1113 by Serafina Mitchell, RN ?Outcome: Adequate for Discharge ?04/21/2022 1019 by Serafina Mitchell, RN ?Outcome: Progressing ?Goal: Ability to verbalize understanding of medication therapies will improve ?04/21/2022 1113 by Serafina Mitchell, RN ?Outcome: Adequate for Discharge ?04/21/2022 1019 by Serafina Mitchell, RN ?Outcome: Progressing ?Goal: Individualized Educational Video(s) ?04/21/2022 1113 by Serafina Mitchell, RN ?Outcome: Adequate for Discharge ?04/21/2022 1019 by Serafina Mitchell, RN ?Outcome: Progressing ?  ?Problem: Activity: ?Goal: Capacity to carry out activities will improve ?04/21/2022 1113 by Serafina Mitchell, RN ?Outcome: Adequate for Discharge ?04/21/2022 1019 by Serafina Mitchell, RN ?Outcome: Progressing ?  ?Problem: Cardiac: ?Goal: Ability to achieve and maintain adequate cardiopulmonary perfusion will improve ?04/21/2022 1113 by Serafina Mitchell, RN ?Outcome: Adequate for Discharge ?04/21/2022 1019 by Serafina Mitchell, RN ?Outcome: Progressing ?  ?Problem: Increased Nutrient Needs (NI-5.1) ?Goal: Food and/or nutrient delivery ?Description: Individualized approach for food/nutrient provision. ?Outcome: Adequate for Discharge ?  ?Problem: Education: ?Goal: Knowledge of General Education information will improve ?Description: Including pain rating scale, medication(s)/side effects and non-pharmacologic comfort measures ?04/21/2022 1113 by Serafina Mitchell, RN ?Outcome: Adequate for Discharge ?04/21/2022 1019 by Serafina Mitchell, RN ?Outcome: Progressing ?  ?Problem: Health Behavior/Discharge Planning: ?Goal: Ability to manage health-related needs will improve ?04/21/2022 1113 by Serafina Mitchell, RN ?Outcome: Adequate for Discharge ?04/21/2022 1019 by Serafina Mitchell, RN ?Outcome: Progressing ?  ?Problem: Clinical Measurements: ?Goal: Ability to maintain clinical  measurements within normal limits will improve ?04/21/2022 1113 by Serafina Mitchell, RN ?Outcome: Adequate for Discharge ?04/21/2022 1019 by Serafina Mitchell, RN ?Outcome: Progressing ?Goal: Will remain free from infection ?04/21/2022 1113 by Serafina Mitchell, RN ?Outcome: Adequate for Discharge ?04/21/2022 1019 by Serafina Mitchell, RN ?Outcome: Progressing ?Goal: Diagnostic test results will improve ?04/21/2022 1113 by Serafina Mitchell, RN ?Outcome: Adequate for Discharge ?04/21/2022 1019 by Serafina Mitchell, RN ?Outcome: Progressing ?Goal: Respiratory complications will improve ?04/21/2022 1113 by Serafina Mitchell, RN ?Outcome: Adequate for Discharge ?04/21/2022 1019 by Serafina Mitchell, RN ?Outcome: Progressing ?Goal: Cardiovascular complication will be avoided ?04/21/2022 1113 by Serafina Mitchell, RN ?Outcome: Adequate for Discharge ?04/21/2022 1019 by Serafina Mitchell, RN ?Outcome: Progressing ?  ?Problem: Activity: ?Goal: Risk for activity intolerance will decrease ?04/21/2022 1113 by Serafina Mitchell, RN ?Outcome: Adequate for Discharge ?04/21/2022 1019 by Serafina Mitchell, RN ?Outcome: Progressing ?  ?Problem: Nutrition: ?Goal: Adequate nutrition will be maintained ?04/21/2022 1113 by Serafina Mitchell, RN ?Outcome: Adequate for Discharge ?04/21/2022 1019 by Serafina Mitchell, RN ?Outcome: Progressing ?  ?Problem: Coping: ?Goal: Level of anxiety will decrease ?04/21/2022 1113 by Serafina Mitchell, RN ?Outcome: Adequate for Discharge ?04/21/2022 1019 by Serafina Mitchell, RN ?Outcome: Progressing ?  ?Problem: Elimination: ?Goal: Will not experience complications related to bowel motility ?04/21/2022 1113 by Serafina Mitchell, RN ?Outcome: Adequate for Discharge ?04/21/2022 1019 by Serafina Mitchell, RN ?Outcome: Progressing ?Goal: Will not experience complications related to urinary retention ?04/21/2022 1113 by Serafina Mitchell, RN ?Outcome: Adequate for Discharge ?04/21/2022  1019 by Serafina Mitchell, RN ?Outcome: Progressing ?  ?Problem: Pain Managment: ?Goal: General experience of comfort will improve ?04/21/2022 1113 by Serafina Mitchell, RN ?Outcome: Adequate for Discharge ?04/21/2022 1019 by Serafina Mitchell, RN ?Outcome: Progressing ?  ?Problem: Safety: ?Goal: Ability to remain free from injury will improve ?04/21/2022 1113 by Serafina Mitchell, RN ?Outcome: Adequate  for Discharge ?04/21/2022 1019 by Serafina Mitchell, RN ?Outcome: Progressing ?  ?Problem: Skin Integrity: ?Goal: Risk for impaired skin integrity will decrease ?04/21/2022 1113 by Serafina Mitchell, RN ?Outcome: Adequate for Discharge ?04/21/2022 1019 by Serafina Mitchell, RN ?Outcome: Progressing ?  ?Problem: Acute Rehab OT Goals (only OT should resolve) ?Goal: Pt. Will Perform Lower Body Bathing ?Outcome: Adequate for Discharge ?Goal: Pt. Will Perform Lower Body Dressing ?Outcome: Adequate for Discharge ?Goal: Pt. Will Transfer To Toilet ?Outcome: Adequate for Discharge ?Goal: OT Additional ADL Goal #1 ?Outcome: Adequate for Discharge ?Goal: OT Additional ADL Goal #2 ?Outcome: Adequate for Discharge ?  ?Problem: Acute Rehab PT Goals(only PT should resolve) ?Goal: Patient Will Transfer Sit To/From Stand ?Outcome: Adequate for Discharge ?Goal: Pt Will Ambulate ?Outcome: Adequate for Discharge ?Goal: Pt Will Go Up/Down Stairs ?Outcome: Adequate for Discharge ?Goal: Pt/caregiver will Perform Home Exercise Program ?Outcome: Adequate for Discharge ?  ?

## 2022-04-21 NOTE — Progress Notes (Signed)
In to see patient with RN in room and pt reports she is back to her baseline is not concerned about any of her basic ADLs. Looking forward to getting back to work. OT will sign off. ? ?[place], OTR/L ?Acute Rehab Services ?Pager [phone] ?Office [phone] ? ? ?

## 2022-04-21 NOTE — TOC Benefit Eligibility Note (Signed)
Patient Advocate Encounter ?  ?Insurance verification completed.   ?  ?The patient is currently admitted and upon discharge could be taking APIXABAN 2.5 MG. ?  ?The current 30 day co-pay is, $45.  ? ?The patient is insured through [place]. ? ? ?   ?

## 2022-04-22 ENCOUNTER — Telehealth: Payer: Self-pay | Admitting: *Deleted

## 2022-04-22 NOTE — Chronic Care Management (AMB) (Signed)
  Care Management   Outreach Note  04/22/2022 Name: [name] MRN: [id number] DOB: 15-Oct-1941  Referred by: Isaac Bliss, Rayford Halsted, MD Reason for referral : Care Coordination (Initial outreach to schedule referral with [facility])   An unsuccessful telephone outreach was attempted today. The patient was referred to the case management team for assistance with care management and care coordination.   Follow Up Plan:  A HIPAA compliant phone message was left for the patient providing contact information and requesting a return call.  The care management team will reach out to the patient again over the next 7 days.  If patient returns call to provider office, please advise to call [place]* at [phone].*  [place] Management  Direct Dial: [phone]

## 2022-04-23 ENCOUNTER — Telehealth: Payer: Self-pay | Admitting: *Deleted

## 2022-04-23 ENCOUNTER — Other Ambulatory Visit: Payer: Medicare HMO

## 2022-04-23 ENCOUNTER — Inpatient Hospital Stay: Payer: Medicare HMO | Admitting: Internal Medicine

## 2022-04-23 NOTE — Telephone Encounter (Signed)
Attempted to reach patient to f/u on status since D/C from hospital. Left VM requesting return call with any questions or concerns related to Dr. [name]. ?

## 2022-04-24 ENCOUNTER — Telehealth: Payer: Self-pay | Admitting: Internal Medicine

## 2022-04-24 NOTE — Telephone Encounter (Signed)
Pt call and stated she want dr. Jerilee Hoh to call in something for itching she stated she is itching all over.

## 2022-04-25 NOTE — Telephone Encounter (Signed)
Patient called back and was informed of the message below.  Attempted to schedule the patient an appt next with her PCP and suddenly could not hear her on the line.  I called the patient back and left a detailed message requesting the patient call back for an appt with Dr Jerilee Hoh.

## 2022-04-25 NOTE — Telephone Encounter (Signed)
Left a message for the patient to return my call.  

## 2022-04-27 NOTE — Progress Notes (Incomplete)
HEART & VASCULAR TRANSITION OF CARE CONSULT NOTE     Referring Physician: Dr. Broadus John Primary Care: Primary Cardiologist:  HPI: Referred to clinic by Dr. Broadus John with [facility] for heart failure consultation. 81 y.o. female with history of CAD with multivessel CAD (not CABG candidate) s/p PCI to RCA, chronic systolic CHF with recovered EF, HTN, HLD, hx AFL, COPD, hx AML in remission, breast cancer and uterine cancer, CKD 3b, cirrhosis of liver  Admitted earlier this month with a/c diastolic CHF in setting of new AF.  Converted to SR ***. She was diuresed with IV lasix then transitioned to PO torsemide.   Cardiac Testing    Review of Systems: [y] = yes, _0  = no   General: Weight gain _1 ; Weight loss _2 ; Anorexia _3 ; Fatigue _4 ; Fever _5 ; Chills _6 ; Weakness _7   Cardiac: Chest pain/pressure _8 ; Resting SOB _9 ; Exertional SOB _10 ; Orthopnea _11 ; Pedal Edema _12 ; Palpitations _13 ; Syncope _14 ; Presyncope _15 ; Paroxysmal nocturnal dyspnea_16   Pulmonary: Cough _17 ; Wheezing_18 ; Hemoptysis_19 ; Sputum _20 ; Snoring _21   GI: Vomiting_22 ; Dysphagia_23 ; Melena_24 ; Hematochezia _25 ; Heartburn_26 ; Abdominal pain _27 ; Constipation _28 ; Diarrhea _29 ; BRBPR _30   GU: Hematuria_31 ; Dysuria _32 ; Nocturia_33   Vascular: Pain in legs with walking _34 ; Pain in feet with lying flat _35 ; Non-healing sores _36 ; Stroke _37 ; TIA _38 ; Slurred speech _39 ;  Neuro: Headaches_40 ; Vertigo_41 ; Seizures_42 ; Paresthesias_43 ;Blurred vision _44 ; Diplopia _45 ; Vision changes _46   Ortho/Skin: Arthritis _47 ; Joint pain _48 ; Muscle pain _49 ; Joint swelling _50 ; Back Pain _51 ; Rash _52   Psych: Depression_53 ; Anxiety_54   Heme: Bleeding problems _55 ; Clotting disorders _56 ; Anemia _57   Endocrine: Diabetes _58 ; Thyroid dysfunction_59    Past Medical History:  Diagnosis Date   Acute myelogenous leukemia ([place]) 02/2014   Acute pancreatitis    Arthritis    "back" (09/17/2017)   Atrial flutter (HCC)    Cardiomyopathy ([place])     Chronic kidney disease, stage II (mild)    Chronic lower back pain    Colon polyps 04/29/2010   TUBULAR ADENOMA AND A SERRATED ADENOMA   COPD (chronic obstructive pulmonary disease) ([place])    CORONARY ARTERY DISEASE 12/24/2007   DIABETES MELLITUS, TYPE II 07/13/2007   History of blood transfusion 2015   "related to leukemia"   History of kidney stones 12/24/2007   History of uterine cancer    HYPERLIPIDEMIA 12/24/2007   HYPERTENSION 07/13/2007   NSTEMI (non-ST elevated myocardial infarction) ([place]) 09/17/2017   Uterine cancer (HCC)     Current Outpatient Medications  Medication Sig Dispense Refill   apixaban (ELIQUIS) 2.5 MG TABS tablet Take 1 tablet (2.5 mg total) by mouth 2 (two) times daily. 60 tablet 0   atorvastatin (LIPITOR) 40 MG tablet TAKE 1 TABLET EVERY DAY (Patient taking differently: Take 40 mg by mouth daily.) 90 tablet 1   Blood Glucose Monitoring Suppl (TRUE METRIX GO GLUCOSE METER) w/Device KIT USE TO CHECK BLOOD SUGARS THREE TIMES A DAY AND PRN (Patient taking differently: 3 (three) times daily.) 1 kit 0   clopidogrel (PLAVIX) 75 MG tablet TAKE 1 TABLET EVERY EVENING (Patient taking differently: Take 75 mg by mouth every evening.) 90 tablet 1   cyanocobalamin (,VITAMIN B-12,) 1000  MCG/ML injection INJECT 1ML IN THE MUSCLE EVERY 30 DAYS AS DIRECTED (Patient taking differently: Inject 1,000 mcg into the muscle every 30 (thirty) days.) 3 mL 3   empagliflozin (JARDIANCE) 25 MG TABS tablet Take 1 tablet (25 mg total) by mouth daily. 30 tablet 0   fluticasone furoate-vilanterol (BREO ELLIPTA) 200-25 MCG/ACT AEPB Inhale 1 puff into the lungs as needed (wheezing SOB).     glucose blood (TRUE METRIX BLOOD GLUCOSE TEST) test strip TEST BLOOD SUGAR THREE TIMES DAILY (Patient taking differently: 3 (three) times daily.) 300 strip 2   HYDROcodone-acetaminophen (NORCO) 5-325 MG tablet Take 1 tablet by mouth every 6 (six) hours as needed for moderate pain. 120 tablet 0   insulin  glargine, 2 Unit Dial, (TOUJEO MAX SOLOSTAR) 300 UNIT/ML Solostar Pen Inject 16 Units into the skin at bedtime. 15 mL 1   Insulin Pen Needle 32G X 4 MM MISC 1 Device by Does not apply route in the morning, at noon, in the evening, and at bedtime. 400 each 3   isosorbide mononitrate (IMDUR) 30 MG 24 hr tablet Take 1 tablet (30 mg total) by mouth daily. TAKE 1 AND 1/2 TABLETS EVERY DAY     letrozole (FEMARA) 2.5 MG tablet Take 1 tablet (2.5 mg total) by mouth daily. 90 tablet 3   levETIRAcetam (KEPPRA) 500 MG tablet Take 1 tablet (500 mg total) by mouth 2 (two) times daily. 180 tablet 4   lidocaine 4 % Place 1 patch onto the skin daily. 30 patch 0   lipase/protease/amylase (CREON) 36000 UNITS CPEP capsule Take 2 capsules (72,000 Units total) by mouth 3 (three) times daily with meals. (Patient taking differently: Take 72,000 Units by mouth 3 (three) times daily as needed (loose stools).) 48 capsule 0   LORazepam (ATIVAN) 0.5 MG tablet TAKE 1 TABLET BY MOUTH EVERY 8 HOURS AS NEEDED FOR ANXIETY(DO NOT EXCEED MORE THAN 2 TABS IN 24 HRS) (Patient taking differently: Take 0.5 mg by mouth every 8 (eight) hours as needed for anxiety.) 60 tablet 2   MAGNESIUM OXIDE 400 PO Take 400 mg by mouth 2 (two) times daily.     metoprolol succinate (TOPROL-XL) 25 MG 24 hr tablet Take 1 tablet (25 mg total) by mouth daily. Take with or immediately following a meal. 30 tablet 0   nitroGLYCERIN (NITROSTAT) 0.4 MG SL tablet PLACE 1 TABLET (0.4 MG TOTAL) UNDER THE TONGUE EVERY 5 (FIVE) MINUTES AS NEEDED FOR CHEST PAIN. 25 tablet 0   polyethylene glycol (MIRALAX / GLYCOLAX) 17 g packet Take 17 g by mouth daily as needed for mild constipation.     potassium chloride (KLOR-CON) 10 MEQ tablet TAKE 1 TABLET EVERY DAY (Patient taking differently: Take 10 mEq by mouth daily.) 90 tablet 1   sacubitril-valsartan (ENTRESTO) 24-26 MG Take 1 tablet by mouth 2 (two) times daily. 60 tablet 0   SYRINGE-NEEDLE, DISP, 3 ML (BD SAFETYGLIDE  SYRINGE/NEEDLE) 25G X 1" 3 ML MISC Use for B12 injections 100 each 11   torsemide (DEMADEX) 20 MG tablet Take 2 tablets (40 mg total) by mouth daily. 60 tablet 0   No current facility-administered medications for this encounter.   Facility-Administered Medications Ordered in Other Encounters  Medication Dose Route Frequency Provider Last Rate Last Admin   sodium chloride 0.9 % 1,000 mL with magnesium sulfate 2 g infusion   Intravenous Continuous Ladell Pier, MD   Stopped at 08/04/14 1441    No Known Allergies    Social History  Socioeconomic History   Marital status: Married    Spouse name: Ashauna Bertholf   Number of children: 1   Years of education: Not on file   Highest education level: 9th grade  Occupational History   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  Tobacco Use   Smoking status: Former    Packs/day: 1.00    Years: 20.00    Pack years: 20.00    Types: Cigarettes    Quit date: 12/08/2000    Years since quitting: 21.3   Smokeless tobacco: Never  Vaping Use   Vaping Use: Never used  Substance and Sexual Activity   Alcohol use: No    Alcohol/week: 0.0 standard drinks   Drug use: No   Sexual activity: Never  Other Topics Concern   Not on file  Social History Narrative   Still works full-time assembling eye glasses _0    Social Determinants of Radio broadcast assistant Strain: Low Risk    Difficulty of Paying Living Expenses: Not hard at all  Food Insecurity: No Food Insecurity   Worried About Charity fundraiser in the Last Year: Never true   Arboriculturist in the Last Year: Never true  Transportation Needs: No Transportation Needs   Lack of Transportation (Medical): No   Lack of Transportation (Non-Medical): No  Physical Activity: Not on file  Stress: Not on file  Social Connections: Not on file  Intimate Partner Violence: Not on file      Family History  Problem Relation Age of Onset   COPD Father    Esophageal  cancer Father    Breast cancer Sister 44   Lung cancer Sister    Esophageal cancer Brother    Suicidality Brother    Lung cancer Sister    Lung cancer Sister    Cervical cancer Sister    Congestive Heart Failure Mother    Heart attack Maternal Grandmother    Skin cancer Maternal Grandfather    Heart attack Maternal Grandfather    Stroke Paternal Grandmother 19   Heart attack Paternal Grandfather    Cancer Maternal Uncle        unknown type, dx. >65   Cancer Paternal Aunt        unknown type, dx. >50   Cancer Paternal Uncle        unknown type, dx. >50   Cirrhosis Maternal Uncle    Skin cancer Daughter    Leukemia Cousin        maternal first cousin; dx. >50, in remission   Cancer Niece        unknown type behind her ear, dx. 30s/40s   Colon cancer Neg Hx     There were no vitals filed for this visit.  PHYSICAL EXAM: General:  Well appearing. No respiratory difficulty HEENT: normal Neck: supple. no JVD. Carotids 2+ bilat; no bruits. No lymphadenopathy or thryomegaly appreciated. Cor: PMI nondisplaced. Regular rate & rhythm. No rubs, gallops or murmurs. Lungs: clear Abdomen: soft, nontender, nondistended. No hepatosplenomegaly. No bruits or masses. Good bowel sounds. Extremities: no cyanosis, clubbing, rash, edema Neuro: alert & oriented x 3, cranial nerves grossly intact. moves all 4 extremities w/o difficulty. Affect pleasant.  ECG:   ASSESSMENT & PLAN: HFmEF with recovered EF:  2. CAD:  3. Hx AFL/Afib:  4. AML Hx breast cancer and uterine cancer  5. CKD IIIb:     NYHA *** GDMT  Diuretic- BB- Ace/ARB/ARNI  MRA SGLT2i    Referred to HFSW (PCP, Medications, Transportation, ETOH Abuse, Drug Abuse, Insurance, Financial ): Yes or No Refer to Pharmacy: Yes or No Refer to Home Health: Yes on No Refer to Advanced Heart Failure Clinic: Yes or no  Refer to General Cardiology: Yes or No  Follow up

## 2022-04-28 ENCOUNTER — Inpatient Hospital Stay (HOSPITAL_COMMUNITY): Admit: 2022-04-28 | Discharge: 2022-04-28 | Disposition: A | Discharge status: Home / Self Care | Payer: Medicare HMO

## 2022-04-28 ENCOUNTER — Telehealth (HOSPITAL_COMMUNITY): Payer: Self-pay | Admitting: *Deleted

## 2022-04-28 NOTE — Telephone Encounter (Signed)
Call attempted to confirm HV TOC appt 3pm on 04/28/22. HIPPA appropriate VM left with callback number.   Earnestine Leys, BSN, Clinical cytogeneticist Only

## 2022-04-30 NOTE — Chronic Care Management (AMB) (Signed)
  Care Management   Outreach Note  04/30/2022 Name: [name] MRN: [id number] DOB: 21-Jul-1941  Referred by: Isaac Bliss, Rayford Halsted, MD Reason for referral : Care Coordination (Initial outreach to schedule referral with [facility])   A second unsuccessful telephone outreach was attempted today. The patient was referred to the case management team for assistance with care management and care coordination.   Follow Up Plan:  A HIPAA compliant phone message was left for the patient providing contact information and requesting a return call.  The care management team will reach out to the patient again over the next 7 days.  If patient returns call to provider office, please advise to call [place]* at [phone].*  [place] Management  Direct Dial: [phone]

## 2022-05-06 NOTE — Chronic Care Management (AMB) (Signed)
  Care Management   Note  05/06/2022 Name: JOHNNYE SANDFORD MRN: [id number] DOB: March 03, 1941  MAYSOON LOZADA is a 81 y.o. year old female who is a primary care patient of Isaac Bliss, Rayford Halsted, MD. I reached out to [name] by phone today offer care coordination services.   Ms. Cribb was given information about care management services today including:  Care management services include personalized support from designated clinical staff supervised by her physician, including individualized plan of care and coordination with other care providers 24/7 contact phone numbers for assistance for urgent and routine care needs. The patient may stop care management services at any time by phone call to the office staff.  Patient agreed to services and verbal consent obtained.   Follow up plan: Telephone appointment with care management team member scheduled for:05/12/22  [place] Management  Direct Dial: [phone]

## 2022-05-08 ENCOUNTER — Telehealth (HOSPITAL_BASED_OUTPATIENT_CLINIC_OR_DEPARTMENT_OTHER): Payer: Self-pay

## 2022-05-08 ENCOUNTER — Ambulatory Visit (HOSPITAL_COMMUNITY)
Admission: RE | Admit: 2022-05-08 | Discharge: 2022-05-08 | Disposition: A | Discharge status: Home / Self Care | Payer: Medicare HMO | Source: Ambulatory Visit | Attending: Cardiology | Admitting: Cardiology

## 2022-05-08 ENCOUNTER — Other Ambulatory Visit (HOSPITAL_COMMUNITY): Payer: Self-pay

## 2022-05-08 ENCOUNTER — Telehealth (HOSPITAL_COMMUNITY): Payer: Self-pay | Admitting: *Deleted

## 2022-05-08 VITALS — BP 140/72 | HR 65 | Wt 160.0 lb

## 2022-05-08 DIAGNOSIS — Z856 Personal history of leukemia: Secondary | ICD-10-CM | POA: Diagnosis not present

## 2022-05-08 DIAGNOSIS — I5042 Chronic combined systolic (congestive) and diastolic (congestive) heart failure: Secondary | ICD-10-CM | POA: Diagnosis not present

## 2022-05-08 DIAGNOSIS — I350 Nonrheumatic aortic (valve) stenosis: Secondary | ICD-10-CM | POA: Diagnosis not present

## 2022-05-08 DIAGNOSIS — I2510 Atherosclerotic heart disease of native coronary artery without angina pectoris: Secondary | ICD-10-CM | POA: Diagnosis not present

## 2022-05-08 DIAGNOSIS — Z955 Presence of coronary angioplasty implant and graft: Secondary | ICD-10-CM | POA: Insufficient documentation

## 2022-05-08 DIAGNOSIS — I3139 Other pericardial effusion (noninflammatory): Secondary | ICD-10-CM | POA: Diagnosis not present

## 2022-05-08 DIAGNOSIS — E785 Hyperlipidemia, unspecified: Secondary | ICD-10-CM | POA: Insufficient documentation

## 2022-05-08 DIAGNOSIS — I130 Hypertensive heart and chronic kidney disease with heart failure and stage 1 through stage 4 chronic kidney disease, or unspecified chronic kidney disease: Secondary | ICD-10-CM | POA: Diagnosis not present

## 2022-05-08 DIAGNOSIS — I4892 Unspecified atrial flutter: Secondary | ICD-10-CM | POA: Diagnosis not present

## 2022-05-08 DIAGNOSIS — Z7984 Long term (current) use of oral hypoglycemic drugs: Secondary | ICD-10-CM | POA: Diagnosis not present

## 2022-05-08 DIAGNOSIS — I10 Essential (primary) hypertension: Secondary | ICD-10-CM

## 2022-05-08 DIAGNOSIS — Z79899 Other long term (current) drug therapy: Secondary | ICD-10-CM | POA: Insufficient documentation

## 2022-05-08 DIAGNOSIS — Z8542 Personal history of malignant neoplasm of other parts of uterus: Secondary | ICD-10-CM | POA: Diagnosis not present

## 2022-05-08 DIAGNOSIS — I5032 Chronic diastolic (congestive) heart failure: Secondary | ICD-10-CM | POA: Insufficient documentation

## 2022-05-08 DIAGNOSIS — Z853 Personal history of malignant neoplasm of breast: Secondary | ICD-10-CM | POA: Diagnosis not present

## 2022-05-08 DIAGNOSIS — I441 Atrioventricular block, second degree: Secondary | ICD-10-CM | POA: Insufficient documentation

## 2022-05-08 DIAGNOSIS — K7460 Unspecified cirrhosis of liver: Secondary | ICD-10-CM | POA: Diagnosis not present

## 2022-05-08 DIAGNOSIS — J449 Chronic obstructive pulmonary disease, unspecified: Secondary | ICD-10-CM | POA: Insufficient documentation

## 2022-05-08 DIAGNOSIS — Z7901 Long term (current) use of anticoagulants: Secondary | ICD-10-CM | POA: Diagnosis not present

## 2022-05-08 DIAGNOSIS — I4891 Unspecified atrial fibrillation: Secondary | ICD-10-CM | POA: Insufficient documentation

## 2022-05-08 DIAGNOSIS — N1832 Chronic kidney disease, stage 3b: Secondary | ICD-10-CM | POA: Insufficient documentation

## 2022-05-08 LAB — BASIC METABOLIC PANEL
Anion gap: 6 (ref 5–15)
BUN: 25 mg/dL — ABNORMAL HIGH (ref 8–23)
CO2: 22 mmol/L (ref 22–32)
Calcium: 7.1 mg/dL — ABNORMAL LOW (ref 8.9–10.3)
Chloride: 114 mmol/L — ABNORMAL HIGH (ref 98–111)
Creatinine, Ser: 1.92 mg/dL — ABNORMAL HIGH (ref 0.44–1.00)
GFR, Estimated: 26 mL/min — ABNORMAL LOW (ref >60)
Glucose, Bld: 72 mg/dL (ref 70–99)
Potassium: 3.4 mmol/L — ABNORMAL LOW (ref 3.5–5.1)
Sodium: 142 mmol/L (ref 135–145)

## 2022-05-08 LAB — BRAIN NATRIURETIC PEPTIDE: B Natriuretic Peptide: 447.5 pg/mL — ABNORMAL HIGH (ref 0.0–100.0)

## 2022-05-08 MED ORDER — ENTRESTO 49-51 MG PO TABS: 1.0000 | ORAL_TABLET | Freq: Two times a day (BID) | ORAL | 11 refills | Status: DC

## 2022-05-08 MED ORDER — EMPAGLIFLOZIN 25 MG PO TABS: 25.0000 mg | ORAL_TABLET | Freq: Every day | ORAL | 11 refills | Status: DC

## 2022-05-08 MED ORDER — METOPROLOL SUCCINATE ER 25 MG PO TB24: 12.5000 mg | ORAL_TABLET | Freq: Every day | ORAL | 11 refills | Status: DC

## 2022-05-08 NOTE — Telephone Encounter (Signed)
Transitions of Care Pharmacy   Call attempted for a pharmacy transitions of care follow-up. HIPAA appropriate voicemail was left with call back information provided.   Call attempt #1. Will follow-up in 2-3 days.   Darcus Austin, [facility] 05/08/2022 1:49 PM

## 2022-05-08 NOTE — Telephone Encounter (Signed)
Call attempted to confirm HV TOC appt 3 pm on 05/08/22. HIPPA appropriate VM left with callback number.    Earnestine Leys, BSN, Clinical cytogeneticist Only

## 2022-05-08 NOTE — Patient Instructions (Addendum)
Entresto Changed to 49-'51mg'$  Twice daily  Decrease Tprol XL to 12.'5mg'$  (1/2 Tab) daily.  Labs done today, your results will be available in MyChart, we will contact you for abnormal readings.  Thank you for allowing [place] to provider your heart failure care after your recent hospitalization. Please follow-up with Dr. Acie Fredrickson as scheduled.  If you have any questions, issues, or concerns before your next appointment please call our office at [phone], opt. 2 and leave a message for the triage nurse.

## 2022-05-08 NOTE — Progress Notes (Signed)
HEART & VASCULAR TRANSITION OF CARE CONSULT NOTE     Referring Physician: Dr. Broadus John Primary Care: Isaac Bliss, Rayford Halsted, MD Primary Cardiologist: Mertie Moores, MD   HPI: Referred to clinic by Dr. Broadus John with [facility] for heart failure consultation. 81 y.o. female with history of CAD with multivessel CAD (not CABG candidate) s/p PCI to RCA, chronic systolic CHF with recovered EF, chronic diastolic heart failure, HTN, HLD, hx AFL, COPD, hx AML in remission, breast cancer and uterine cancer w/ concerns for sclerotic mets on recent imaging (oncology following), CKD 3b and cirrhosis of the liver.  Admitted 1/61 with a/c diastolic CHF in setting of new AF.  Denied CP. HS trop 16>>15. Echo EF 60-65%, RV normal.  Placed on Eliquis and metoprolol. Spontaneously converted to SR. She was diuresed with IV lasix then transitioned to PO torsemide. D/c wt 150 lb. Referred to [facility] clinic.   Presents to clinic today for assessment. Wt up post hospital at 159 lb. Reports full med compliance. Notes b/l LEE (2+ on exam). Watching sodium/fluid intake. Denies resting dyspnea. SOB w/ activity, NYHA Class II.   EKG shows 2nd degree Type I AV block, 55 bpm. Asymptomatic. Denies fatigue. No syncope/near syncope.   Cardiac Testing   2D Echo 5/23 Left ventricular ejection fraction, by estimation, is 60 to 65%. The left ventricle has normal function. The left ventricle has no regional wall motion abnormalities. There is mild left ventricular hypertrophy. Left ventricular diastolic function could not be evaluated. 1. Right ventricular systolic function is normal. The right ventricular size is normal. There is normal pulmonary artery systolic pressure. The estimated right ventricular systolic pressure is 09.6 mmHg. 2. A small pericardial effusion is present. The pericardial effusion is circumferential. There is no evidence of cardiac tamponade. 3. 4. The mitral valve is abnormal. Trivial mitral valve  regurgitation. The aortic valve was not well visualized. There is mild calcification of the aortic valve. Aortic valve regurgitation is not visualized. Mild to moderate aortic valve stenosis. Aortic valve area, by VTI measures 1.28 cm. Aortic valve mean gradient measures 17.0 mmHg. Aortic valve Vmax measures 2.65 m/s. DI is 0.41. 5. The inferior vena cava is normal in size with <50% respiratory variability, suggesting right atrial pressure of 8 mmHg.   Review of Systems: [y] = yes, [ ]  = no   General: Weight gain [Y ]; Weight loss [ ] ; Anorexia [ ] ; Fatigue [ ] ; Fever [ ] ; Chills [ ] ; Weakness [ ]   Cardiac: Chest pain/pressure [ ] ; Resting SOB [ ] ; Exertional SOB [ ] ; Orthopnea [ ] ; Pedal Edema [ Y]; Palpitations [ ] ; Syncope [ ] ; Presyncope [ ] ; Paroxysmal nocturnal dyspnea[ ]   Pulmonary: Cough [ ] ; Wheezing[ ] ; Hemoptysis[ ] ; Sputum [ ] ; Snoring [ ]   GI: Vomiting[ ] ; Dysphagia[ ] ; Melena[ ] ; Hematochezia [ ] ; Heartburn[ ] ; Abdominal pain [ ] ; Constipation [ ] ; Diarrhea [ ] ; BRBPR [ ]   GU: Hematuria[ ] ; Dysuria [ ] ; Nocturia[ ]   Vascular: Pain in legs with walking [ ] ; Pain in feet with lying flat [ ] ; Non-healing sores [ ] ; Stroke [ ] ; TIA [ ] ; Slurred speech [ ] ;  Neuro: Headaches[ ] ; Vertigo[ ] ; Seizures[ ] ; Paresthesias[ ] ;Blurred vision [ ] ; Diplopia [ ] ; Vision changes [ ]   Ortho/Skin: Arthritis [ ] ; Joint pain [ ] ; Muscle pain [ ] ; Joint swelling [ ] ; Back Pain [ Y]; Rash [ ]   Psych: Depression[ ] ; Anxiety[ ]   Heme: Bleeding problems [ ] ; Clotting disorders [ ] ;  Anemia [ ]   Endocrine: Diabetes [ ] ; Thyroid dysfunction[ ]    Past Medical History:  Diagnosis Date   Acute myelogenous leukemia ([place]) 02/2014   Acute pancreatitis    Arthritis    "back" (09/17/2017)   Atrial flutter ([facility])    Cardiomyopathy ([place])    Chronic kidney disease, stage II (mild)    Chronic lower back pain    Colon polyps 04/29/2010   TUBULAR ADENOMA AND A SERRATED ADENOMA   COPD (chronic obstructive  pulmonary disease) ([place])    CORONARY ARTERY DISEASE 12/24/2007   DIABETES MELLITUS, TYPE II 07/13/2007   History of blood transfusion 2015   "related to leukemia"   History of kidney stones 12/24/2007   History of uterine cancer    HYPERLIPIDEMIA 12/24/2007   HYPERTENSION 07/13/2007   NSTEMI (non-ST elevated myocardial infarction) ([place]) 09/17/2017   Uterine cancer (HCC)     Current Outpatient Medications  Medication Sig Dispense Refill   apixaban (ELIQUIS) 2.5 MG TABS tablet Take 1 tablet (2.5 mg total) by mouth 2 (two) times daily. 60 tablet 0   atorvastatin (LIPITOR) 40 MG tablet TAKE 1 TABLET EVERY DAY (Patient taking differently: Take 40 mg by mouth daily.) 90 tablet 1   Blood Glucose Monitoring Suppl (TRUE METRIX GO GLUCOSE METER) w/Device KIT USE TO CHECK BLOOD SUGARS THREE TIMES A DAY AND PRN (Patient taking differently: 3 (three) times daily.) 1 kit 0   clopidogrel (PLAVIX) 75 MG tablet TAKE 1 TABLET EVERY EVENING (Patient taking differently: Take 75 mg by mouth every evening.) 90 tablet 1   cyanocobalamin (,VITAMIN B-12,) 1000 MCG/ML injection INJECT 1ML IN THE MUSCLE EVERY 30 DAYS AS DIRECTED (Patient taking differently: Inject 1,000 mcg into the muscle every 30 (thirty) days.) 3 mL 3   empagliflozin (JARDIANCE) 25 MG TABS tablet Take 1 tablet (25 mg total) by mouth daily. 30 tablet 0   fluticasone furoate-vilanterol (BREO ELLIPTA) 200-25 MCG/ACT AEPB Inhale 1 puff into the lungs as needed (wheezing SOB).     glucose blood (TRUE METRIX BLOOD GLUCOSE TEST) test strip TEST BLOOD SUGAR THREE TIMES DAILY (Patient taking differently: 3 (three) times daily.) 300 strip 2   HYDROcodone-acetaminophen (NORCO) 5-325 MG tablet Take 1 tablet by mouth every 6 (six) hours as needed for moderate pain. 120 tablet 0   insulin glargine, 2 Unit Dial, (TOUJEO MAX SOLOSTAR) 300 UNIT/ML Solostar Pen Inject 16 Units into the skin at bedtime. 15 mL 1   Insulin Pen Needle 32G X 4 MM MISC 1 Device by Does  not apply route in the morning, at noon, in the evening, and at bedtime. 400 each 3   isosorbide mononitrate (IMDUR) 30 MG 24 hr tablet Take 1 tablet (30 mg total) by mouth daily. TAKE 1 AND 1/2 TABLETS EVERY DAY     letrozole (FEMARA) 2.5 MG tablet Take 1 tablet (2.5 mg total) by mouth daily. 90 tablet 3   lidocaine 4 % Place 1 patch onto the skin daily. 30 patch 0   lipase/protease/amylase (CREON) 36000 UNITS CPEP capsule Take 2 capsules (72,000 Units total) by mouth 3 (three) times daily with meals. (Patient taking differently: Take 72,000 Units by mouth 3 (three) times daily as needed (loose stools).) 48 capsule 0   LORazepam (ATIVAN) 0.5 MG tablet TAKE 1 TABLET BY MOUTH EVERY 8 HOURS AS NEEDED FOR ANXIETY(DO NOT EXCEED MORE THAN 2 TABS IN 24 HRS) (Patient taking differently: Take 0.5 mg by mouth every 8 (eight) hours as needed for anxiety.) 60 tablet  2   MAGNESIUM OXIDE 400 PO Take 400 mg by mouth 2 (two) times daily.     metoprolol succinate (TOPROL-XL) 25 MG 24 hr tablet Take 1 tablet (25 mg total) by mouth daily. Take with or immediately following a meal. 30 tablet 0   nitroGLYCERIN (NITROSTAT) 0.4 MG SL tablet PLACE 1 TABLET (0.4 MG TOTAL) UNDER THE TONGUE EVERY 5 (FIVE) MINUTES AS NEEDED FOR CHEST PAIN. 25 tablet 0   polyethylene glycol (MIRALAX / GLYCOLAX) 17 g packet Take 17 g by mouth daily as needed for mild constipation.     potassium chloride (KLOR-CON) 10 MEQ tablet TAKE 1 TABLET EVERY DAY (Patient taking differently: Take 10 mEq by mouth daily.) 90 tablet 1   sacubitril-valsartan (ENTRESTO) 24-26 MG Take 1 tablet by mouth 2 (two) times daily. 60 tablet 0   SYRINGE-NEEDLE, DISP, 3 ML (BD SAFETYGLIDE SYRINGE/NEEDLE) 25G X 1" 3 ML MISC Use for B12 injections 100 each 11   torsemide (DEMADEX) 20 MG tablet Take 2 tablets (40 mg total) by mouth daily. 60 tablet 0   levETIRAcetam (KEPPRA) 500 MG tablet Take 1 tablet (500 mg total) by mouth 2 (two) times daily. 180 tablet 4   No current  facility-administered medications for this encounter.   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  Tobacco Use   Smoking status: Former    Packs/day: 1.00    Years: 20.00    Pack years: 20.00    Types: Cigarettes    Quit date: 12/08/2000    Years since quitting: 21.4   Smokeless tobacco: Never  Vaping Use   Vaping Use: Never used  Substance and Sexual Activity   Alcohol use: No    Alcohol/week: 0.0 standard drinks   Drug use: No   Sexual activity: Never  Other Topics Concern   Not on file  Social History Narrative   Still works full-time assembling eye glasses $[id number]$    Social Determinants of Radio broadcast assistant Strain: Low Risk    Difficulty of Paying Living Expenses: Not hard at all  Food Insecurity: No Food Insecurity   Worried About Charity fundraiser in the Last Year: Never true   Arboriculturist in the Last Year: Never true  Transportation Needs: No Transportation Needs   Lack of Transportation (Medical): No   Lack of Transportation (Non-Medical): No  Physical Activity: Not on file  Stress: Not on file  Social Connections: Not on file  Intimate Partner Violence: Not on file      Family History  Problem Relation Age of Onset   COPD Father    Esophageal cancer Father    Breast cancer Sister 67   Lung cancer Sister    Esophageal cancer Brother    Suicidality Brother    Lung cancer Sister    Lung cancer Sister    Cervical  cancer Sister  Congestive Heart Failure Mother    Heart attack Maternal Grandmother    Skin cancer Maternal Grandfather    Heart attack Maternal Grandfather    Stroke Paternal Grandmother 69   Heart attack Paternal Grandfather    Cancer Maternal Uncle        unknown type, dx. >31   Cancer Paternal Aunt        unknown type, dx. >50   Cancer Paternal Uncle        unknown type, dx. >50   Cirrhosis Maternal Uncle    Skin cancer Daughter    Leukemia Cousin        maternal first cousin; dx. >87, in remission   Cancer Niece        unknown type behind her ear, dx. 30s/40s   Colon cancer Neg Hx     Vitals:   05/08/22 1519  BP: 140/72  Pulse: 65  SpO2: 97%  Weight: 72.6 kg    PHYSICAL EXAM: General:  Well appearing elderly female. No respiratory difficulty HEENT: normal Neck: supple. JVD 8 cm. Carotids 2+ bilat; no bruits. No lymphadenopathy or thryomegaly appreciated. Cor: PMI nondisplaced. Irregular rhythm, slow rate. 2/6 AS murmur Lungs: clear Abdomen: soft, nontender, nondistended. No hepatosplenomegaly. No bruits or masses. Good bowel sounds. Extremities: no cyanosis, clubbing, rash, 2+ b/l LEE  Neuro: alert & oriented x 3, cranial nerves grossly intact. moves all 4 extremities w/o difficulty. Affect pleasant.  ECG: 2nd Degree AV block type 1   ASSESSMENT & PLAN: H/o HRrEF>>HFimEF/ Chronic Diastolic Heart Failure: - Echo 2018 EF down 40-45% - Echo 5/23 EF 60-65%, normal RV, mild-mod AS - NYHA Class II. Mild volume overload on exam. BP moderately elevated - Increase Entresto to 49-51 mg bid - Continue Jardiance 25 mg daily  - Continue Torsemide 40 mg daily. May need increase to 60 mg daily pending BNP level. Will follow - Consider addition of low dose spiro next pending SCr/K  - Encouraged TED hoses  - Check BMP and BNP today  - Encouraged low sodium diet + fluid restriction    2. CAD: - multivessel CAD (not CABG candidate) s/p PCI to RCA - stable w/o CP  - on  statin  + ? blocker - no ASA w/ Eliquis use   3. Hx AFL/Afib: - EKG today shows type I 2nd deg AVB, HR 55 bpm. Asymptomatic - Reduce metoprolol to 12.5 mg daily  - Eliquis 2.5 mg bid (reduced dose given age and renal fx). Denies abnormal bleeding    4. Malignancy (H/o AML, Breast cancer and Uterine cancer) - per recent hospital records, concern for sclerotic mets on recent imaging - oncology following   5. CKD IIIb: - BL Scr 1.5-1.7  - BMP today     NYHA II GDMT  Diuretic- Torsemide 40 mg daily  BB- Toprol XL 12.5 mg daily  Ace/ARB/ARNI  Entresto 49-51 mg bid MRA No CKD  SGLT2i Jardiance 25 mg daily     Referred to HFSW (PCP, Medications, Transportation, ETOH Abuse, Drug Abuse, Insurance, Financial ):  No  Refer to Pharmacy:  No Refer to Home Health: No Refer to Advanced Heart Failure Clinic: No  Refer to General Cardiology: Yes (already followed by Dr. Acie Fredrickson)  Follow up  Keep f/u w/ Dr. Cathie Olden on 6/12.

## 2022-05-09 ENCOUNTER — Inpatient Hospital Stay: Payer: Medicare HMO

## 2022-05-12 ENCOUNTER — Ambulatory Visit: Payer: Medicare HMO

## 2022-05-12 DIAGNOSIS — I10 Essential (primary) hypertension: Secondary | ICD-10-CM

## 2022-05-12 DIAGNOSIS — I5043 Acute on chronic combined systolic (congestive) and diastolic (congestive) heart failure: Secondary | ICD-10-CM

## 2022-05-12 DIAGNOSIS — Z794 Long term (current) use of insulin: Secondary | ICD-10-CM

## 2022-05-12 DIAGNOSIS — I480 Paroxysmal atrial fibrillation: Secondary | ICD-10-CM

## 2022-05-12 DIAGNOSIS — E1122 Type 2 diabetes mellitus with diabetic chronic kidney disease: Secondary | ICD-10-CM

## 2022-05-12 DIAGNOSIS — E785 Hyperlipidemia, unspecified: Secondary | ICD-10-CM

## 2022-05-12 NOTE — Patient Instructions (Signed)
Visit Information  Thank you for taking time to visit with me today. Please don't hesitate to contact me if I can be of assistance to you before our next scheduled telephone appointment.  Following are the goals we discussed today:  Take all medications as prescribed Attend all scheduled provider appointments Call pharmacy for medication refills 3-7 days in advance of running out of medications Call provider office for new concerns or questions  call office if I gain more than 2 pounds in one day or 5 pounds in one week keep legs up while sitting track weight in diary watch for swelling in feet, ankles and legs every day weigh myself daily follow rescue plan if symptoms flare-up eat more whole grains, fruits and vegetables, lean meats and healthy fats keep appointment with eye doctor check blood sugar at prescribed times: once daily and when you have symptoms of low or high blood sugar take the blood sugar log to all doctor visits fill half of plate with vegetables manage portion size switch to sugar-free drinks check pulse (heart) rate once a day make a plan to eat healthy take medicine as prescribed check blood pressure daily keep a blood pressure log limit salt intake to $RemoveB'2000mg'HdueVfzs$ /day call for medicine refill 2 or 3 days before it runs out take all medications exactly as prescribed call doctor with any symptoms you believe are related to your medicine Heart Failure Action Plan A heart failure action plan helps you understand what to do when you have symptoms of heart failure. Your action plan is a color-coded plan that lists the symptoms to watch for and indicates what actions to take. If you have symptoms in the red zone, you need medical care right away. If you have symptoms in the yellow zone, you are having problems. If you have symptoms in the green zone, you are doing well. Follow the plan that was created by you and your health care provider. Review your plan each time you  visit your health care provider. Red zone These signs and symptoms mean you should get medical help right away: You have trouble breathing when resting. You have a dry cough that is getting worse. You have swelling or pain in your legs or abdomen that is getting worse. You suddenly gain more than 2-3 lb (0.9-1.4 kg) in 24 hours, or more than 5 lb (2.3 kg) in a week. This amount may be more or less depending on your condition. You have trouble staying awake or you feel confused. You have chest pain. You do not have an appetite. You pass out. You have worsening sadness or depression. If you have any of these symptoms, call your local emergency services (911 in the [place] right away. Do not drive yourself to the hospital. Yellow zone These signs and symptoms mean your condition may be getting worse and you should make some changes: You have trouble breathing when you are active, or you need to sleep with your head raised on extra pillows to help you breathe. You have swelling in your legs or abdomen. You gain 2-3 lb (0.9-1.4 kg) in 24 hours, or 5 lb (2.3 kg) in a week. This amount may be more or less depending on your condition. You get tired easily. You have trouble sleeping. You have a dry cough. If you have any of these symptoms: Contact your health care provider within the next day. Your health care provider may adjust your medicines. Green zone These signs mean you are doing well and  can continue what you are doing: You do not have shortness of breath. You have very little swelling or no new swelling. Your weight is stable (no gain or loss). You have a normal activity level. You do not have chest pain or any other new symptoms. Follow these instructions at home: Take over-the-counter and prescription medicines only as told by your health care provider. Weigh yourself daily. Your target weight is __________ lb (__________ kg). Call your health care provider if you gain more than  __________ lb (__________ kg) in 24 hours, or more than __________ lb (__________ kg) in a week. Health care provider name: _____________________________________________________ Health care provider phone number: _____________________________________________________ Eat a heart-healthy diet. Work with a diet and nutrition specialist (dietitian) to create an eating plan that is best for you. Keep all follow-up visits. This is important. Where to find more information American Heart Association: www.heart.org Summary A heart failure action plan helps you understand what to do when you have symptoms of heart failure. Follow the action plan that was created by you and your health care provider. Get help right away if you have any symptoms in the red zone. This information is not intended to replace advice given to you by your health care provider. Make sure you discuss any questions you have with your health care provider. Document Revised: 07/09/2020 Document Reviewed: 07/09/2020 Elsevier Patient Education  [place] next appointment is by telephone on 06/09/22 at 9 AM  Please call the care guide team at [phone] if you need to cancel or reschedule your appointment.   If you are experiencing a Mental Health or [place] or need someone to talk to, please call the Suicide and Crisis Lifeline: 988 call the [place] National Suicide Prevention Lifeline: (514)100-2120 or TTY: 731-735-8558 TTY 848-690-2597) to talk to a trained counselor call 1-800-273-TALK (toll free, 24 hour hotline) go to [facility] [address] [place] [phone]) call 911   Following is a copy of your full plan of care:  Care Plan : RN Care Manager Plan of Care  Updates made by Dimitri Ped, RN since 05/12/2022 12:00 AM     Problem: Disease Management and Care Coordination Needs (CHF, DM, Atrial fibrillation,  HTN, HLD)   Priority: High      Long-Range Goal: Establish Plan of Care for Care Coordination and Disease Management Needs(CHF, DM, Atrial fibrillation,  HTN, HLD)   Start Date: 05/12/2022  Expected End Date: 11/08/2022  Priority: High  Note:   Current Barriers:  Knowledge Deficits related to plan of care for management of Atrial Fibrillation, CHF, HTN, HLD, and DMII  States she has not been weighting daily but she weighted 162 this morning. States she does have swelling in her legs and she sometimes wears her compression hose.  States she gets short of breath with exertion and denies any chest pains. States she checks her CBG daily and has ranged in the 150's in the morning this last week.  States she tries to watch the salt and sugars in her diet.  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 States her leukemia is in remission and she sees Dr. [name] regularly.  States her chronic back pain is controlled with her pain medication RNCM Clinical Goal(s):  Patient will verbalize understanding of plan for management of Atrial Fibrillation, CHF, HTN, HLD, and DMII as evidenced by voiced adherence to plan of care verbalize basic  understanding of  Atrial Fibrillation, CHF, HTN, HLD, and DMII disease process and self health management plan as evidenced by voiced understanding and teach back  take all medications exactly as prescribed and will call provider for medication related questions as evidenced by dispense report and pt verbalization  attend all scheduled medical appointments: Cardiology 05/19/22, Oncology 06/30/22 as evidenced by medical records demonstrate Improved adherence to prescribed treatment plan for Atrial Fibrillation, CHF, HTN, HLD, and DMII as evidenced by readings within limits, voiced adherence to plan of care continue to work with RN Care Manager to address care management and care coordination needs related to  Atrial Fibrillation, CHF, HTN, HLD, and DMII as evidenced by adherence to CM Team  Scheduled appointments not experience hospital admission as evidenced by review of EMR. Hospital Admissions in last 6 months = 2  through collaboration with RN Care manager, provider, and care team.   Interventions: 1:1 collaboration with primary care provider regarding development and update of comprehensive plan of care as evidenced by provider attestation and co-signature Inter-disciplinary care team collaboration (see longitudinal plan of care) Evaluation of current treatment plan related to  self management and patient's adherence to plan as established by provider   AFIB Interventions: (Status:  New goal.) Long Term Goal   Counseled on increased risk of stroke due to Afib and benefits of anticoagulation for stroke prevention Reviewed importance of adherence to anticoagulant exactly as prescribed Counseled on bleeding risk associated with Eliquis and importance of self-monitoring for signs/symptoms of bleeding Afib action plan reviewed   Heart Failure Interventions:  (Status:  New goal.) Long Term Goal Basic overview and discussion of pathophysiology of Heart Failure reviewed Provided education on low sodium diet Reviewed Heart Failure Action Plan in depth and provided written copy Assessed need for readable accurate scales in home Provided education about placing scale on hard, flat surface Advised patient to weigh each morning after emptying bladder Discussed importance of daily weight and advised patient to weigh and record daily Discussed the importance of keeping all appointments with provider Reviewed to weigh every day and keep log of weights  .  Mailed [place]  calendar   Diabetes Interventions:  (Status:  New goal.) Long Term Goal Assessed patient's understanding of A1c goal: <8% Provided education to patient about basic DM disease process Reviewed medications with patient and discussed importance of medication adherence Discussed plans with patient for  ongoing care management follow up and provided patient with direct contact information for care management team Provided patient with written educational materials related to hypo and hyperglycemia and importance of correct treatment Advised patient, providing education and rationale, to check cbg daily and record, calling provider for findings outside established parameters Screening for signs and symptoms of depression related to chronic disease state  Assessed social determinant of health barriers Lab Results  Component Value Date   HGBA1C 8.0 (A) 04/16/2022   Hyperlipidemia Interventions:  (Status:  New goal.) Long Term Goal Medication review performed; medication list updated in electronic medical record.  Counseled on importance of regular laboratory monitoring as prescribed Reviewed role and benefits of statin for ASCVD risk reduction  Hypertension Interventions:  (Status:  New goal.) Long Term Goal Last practice recorded BP readings:  BP Readings from Last 3 Encounters:  05/08/22 140/72  04/21/22 (!) 163/76  04/16/22 (!) 150/70  Most recent eGFR/CrCl:  Lab Results  Component Value Date   EGFR 37 (L) 09/04/2017    No components found for: CRCL  Evaluation  of current treatment plan related to hypertension self management and patient's adherence to plan as established by provider Reviewed medications with patient and discussed importance of compliance Advised patient, providing education and rationale, to monitor blood pressure daily and record, calling PCP for findings outside established parameters  Patient Goals/Self-Care Activities: Take all medications as prescribed Attend all scheduled provider appointments Call pharmacy for medication refills 3-7 days in advance of running out of medications Call provider office for new concerns or questions  call office if I gain more than 2 pounds in one day or 5 pounds in one week keep legs up while sitting track weight in  diary watch for swelling in feet, ankles and legs every day weigh myself daily follow rescue plan if symptoms flare-up eat more whole grains, fruits and vegetables, lean meats and healthy fats keep appointment with eye doctor check blood sugar at prescribed times: once daily and when you have symptoms of low or high blood sugar take the blood sugar log to all doctor visits fill half of plate with vegetables manage portion size switch to sugar-free drinks check pulse (heart) rate once a day make a plan to eat healthy take medicine as prescribed check blood pressure daily keep a blood pressure log limit salt intake to $RemoveB'2000mg'GBPfogrB$ /day call for medicine refill 2 or 3 days before it runs out take all medications exactly as prescribed call doctor with any symptoms you believe are related to your medicine  Follow Up Plan:  Telephone follow up appointment with care management team member scheduled for:  06/09/22 The patient has been provided with contact information for the care management team and has been advised to call with any health related questions or concerns.       Ms. Buren was given information about Care Management services by the embedded care coordination team including:  Care Management services include personalized support from designated clinical staff supervised by her physician, including individualized plan of care and coordination with other care providers 24/7 contact phone numbers for assistance for urgent and routine care needs. The patient may stop CCM services at any time (effective at the end of the month) by phone call to the office staff.  Patient agreed to services and verbal consent obtained.   The patient verbalized understanding of instructions, educational materials, and care plan provided today and agreed to receive a mailed copy of patient instructions, educational materials, and care plan.   Telephone follow up appointment with care management team member  scheduled for: 06/09/22 at [place], [facility], CDE Care Management Coordinator [facility] [phone]

## 2022-05-12 NOTE — Chronic Care Management (AMB) (Signed)
Care Management    RN Visit Note  05/12/2022 Name: [name] MRN: [id number] DOB: 04-Jun-1941  Subjective: [name] is a 81 y.o. year old female who is a primary care patient of [name], Catherine Halsted, MD. The care management team was consulted for assistance with disease management and care coordination needs.    Engaged with patient by telephone for initial visit in response to provider referral for case management and/or care coordination services.   Consent to Services:   [name] was given information about Care Management services today including:  Care Management services includes personalized support from designated clinical staff supervised by her physician, including individualized plan of care and coordination with other care providers 24/7 contact phone numbers for assistance for urgent and routine care needs. The patient may stop case management services at any time by phone call to the office staff.  Patient agreed to services and consent obtained.   Assessment: Review of patient past medical history, allergies, medications, health status, including review of consultants reports, laboratory and other test data, was performed as part of comprehensive evaluation and provision of chronic care management services.   SDOH (Social Determinants of Health) assessments and interventions performed:  SDOH Interventions    Flowsheet Row Most Recent Value  SDOH Interventions   Food Insecurity Interventions Intervention Not Indicated  Financial Strain Interventions Intervention Not Indicated  Housing Interventions Intervention Not Indicated  Stress Interventions Intervention Not Indicated        Care Plan  No Known Allergies  Outpatient Encounter Medications as of 05/12/2022  Medication Sig   apixaban (ELIQUIS) 2.5 MG TABS tablet Take 1 tablet (2.5 mg total) by mouth 2 (two) times daily.   atorvastatin (LIPITOR) 40 MG tablet TAKE 1 TABLET EVERY DAY   Blood Glucose  Monitoring Suppl (TRUE METRIX GO GLUCOSE METER) w/Device KIT USE TO CHECK BLOOD SUGARS THREE TIMES A DAY AND PRN   clopidogrel (PLAVIX) 75 MG tablet TAKE 1 TABLET EVERY EVENING   cyanocobalamin (,VITAMIN B-12,) 1000 MCG/ML injection INJECT 1ML IN THE MUSCLE EVERY 30 DAYS AS DIRECTED   empagliflozin (JARDIANCE) 25 MG TABS tablet Take 1 tablet (25 mg total) by mouth daily.   fluticasone furoate-vilanterol (BREO ELLIPTA) 200-25 MCG/ACT AEPB Inhale 1 puff into the lungs as needed (wheezing SOB).   glucose blood (TRUE METRIX BLOOD GLUCOSE TEST) test strip TEST BLOOD SUGAR THREE TIMES DAILY   HYDROcodone-acetaminophen (NORCO) 5-325 MG tablet Take 1 tablet by mouth every 6 (six) hours as needed for moderate pain.   insulin glargine, 2 Unit Dial, (TOUJEO MAX SOLOSTAR) 300 UNIT/ML Solostar Pen Inject 16 Units into the skin at bedtime.   Insulin Pen Needle 32G X 4 MM MISC 1 Device by Does not apply route in the morning, at noon, in the evening, and at bedtime.   isosorbide mononitrate (IMDUR) 30 MG 24 hr tablet Take 1 tablet (30 mg total) by mouth daily. TAKE 1 AND 1/2 TABLETS EVERY DAY   letrozole (FEMARA) 2.5 MG tablet Take 1 tablet (2.5 mg total) by mouth daily.   levETIRAcetam (KEPPRA) 500 MG tablet Take 1 tablet (500 mg total) by mouth 2 (two) times daily.   lidocaine 4 % Place 1 patch onto the skin daily.   lipase/protease/amylase (CREON) 36000 UNITS CPEP capsule Take 2 capsules (72,000 Units total) by mouth 3 (three) times daily with meals.   LORazepam (ATIVAN) 0.5 MG tablet TAKE 1 TABLET BY MOUTH EVERY 8 HOURS AS NEEDED FOR ANXIETY(DO NOT  EXCEED MORE THAN 2 TABS IN 24 HRS)   MAGNESIUM OXIDE 400 PO Take 400 mg by mouth 2 (two) times daily.   metoprolol succinate (TOPROL-XL) 25 MG 24 hr tablet Take 0.5 tablets (12.5 mg total) by mouth daily. Take with or immediately following a meal.   nitroGLYCERIN (NITROSTAT) 0.4 MG SL tablet PLACE 1 TABLET (0.4 MG TOTAL) UNDER THE TONGUE EVERY 5 (FIVE) MINUTES AS  NEEDED FOR CHEST PAIN.   polyethylene glycol (MIRALAX / GLYCOLAX) 17 g packet Take 17 g by mouth daily as needed for mild constipation.   potassium chloride (KLOR-CON) 10 MEQ tablet TAKE 1 TABLET EVERY DAY   sacubitril-valsartan (ENTRESTO) 49-51 MG Take 1 tablet by mouth 2 (two) times daily.   SYRINGE-NEEDLE, DISP, 3 ML (BD SAFETYGLIDE SYRINGE/NEEDLE) 25G X 1" 3 ML MISC Use for B12 injections   torsemide (DEMADEX) 20 MG tablet Take 2 tablets (40 mg total) by mouth daily.   Facility-Administered Encounter Medications as of 05/12/2022  Medication   sodium chloride 0.9 % 1,000 mL with magnesium sulfate 2 g infusion    Patient Active Problem List   Diagnosis Date Noted   Paroxysmal atrial fibrillation ([place])    Abdominal pain 04/17/2022   Pulmonary nodule 04/17/2022   DNR (do not resuscitate) 04/17/2022   Acute renal failure superimposed on stage 4 chronic kidney disease ([place]) 12/06/2021   Hyperglycemia 12/06/2021   Seizure ([place]) 12/05/2021   Acute on chronic diastolic CHF (congestive heart failure) ([place]) 05/30/2020   Genetic testing 12/29/2019   Family history of esophageal cancer    Family history of lung cancer    Family history of cervical cancer    Family history of skin cancer    Family history of leukemia    History of uterine cancer    Carcinoma of upper-outer quadrant of left breast in female, estrogen receptor positive ([place]) 10/17/2019   Type 2 diabetes mellitus with hyperglycemia, with long-term current use of insulin ([place]) 05/04/2019   Type 2 diabetes mellitus with stage 3 chronic kidney disease, with long-term current use of insulin ([place]) 05/04/2019   Diabetes mellitus ([place]) 05/04/2019   Morbid (severe) obesity due to excess calories ([place]) 10/27/2018   Localization-related (focal) (partial) symptomatic epilepsy and epileptic syndromes with complex partial seizures, not intractable, without status epilepticus ([place]) 10/27/2018   Expressive aphasia 11/21/2017   Hyponatremia  11/21/2017   TIA (transient ischemic attack)    Chest pain 09/24/2017   History of non-ST elevation myocardial infarction (NSTEMI)    Chronic combined systolic and diastolic heart failure ([place])    Vitamin B12 deficiency 10/29/2016   Port catheter in place 05/30/2016   Other pancytopenia ([place]) 08/09/2014   Leukemia, acute ([place]) 06/08/2014   History of TIA (transient ischemic attack) 05/24/2014   CKD (chronic kidney disease) stage 4, GFR 15-29 ml/min (H[facility]) 05/24/2014   AML (acute myeloblastic leukemia) ([place]) 05/24/2014   Acute leukemia ([place]) 03/02/2014   Cervical spine pain 02/24/2014   Alkaline phosphatase elevation 02/24/2014   Chronic back pain 02/13/2014   PAF- flutter 02/04/2014   Pancreatitis 01/29/2014   Lesion of pancreas 01/29/2014   HIP PAIN, LEFT 01/26/2008   Hyperlipidemia 12/24/2007   CAD (coronary artery disease) 12/24/2007   CHEST PAIN, ATYPICAL 12/24/2007   NEPHROLITHIASIS, HX OF 12/24/2007   Insulin dependent diabetes mellitus 07/13/2007   Essential hypertension 07/13/2007    Conditions to be addressed/monitored: Atrial Fibrillation, CHF, HTN, HLD, and DMII  Care Plan : RN Care Manager Plan of Care  Updates  made by Dimitri Ped, RN since 05/12/2022 12:00 AM     Problem: Disease Management and Care Coordination Needs (CHF, DM, Atrial fibrillation,  HTN, HLD)   Priority: High     Long-Range Goal: Establish Plan of Care for Care Coordination and Disease Management Needs(CHF, DM, Atrial fibrillation,  HTN, HLD)   Start Date: 05/12/2022  Expected End Date: 11/08/2022  Priority: High  Note:   Current Barriers:  Knowledge Deficits related to plan of care for management of Atrial Fibrillation, CHF, HTN, HLD, and DMII  States she has not been weighting daily but she weighted 162 this morning. States she does have swelling in her legs and she sometimes wears her compression hose.  States she gets short of breath with exertion and denies any chest pains. States she  checks her CBG daily and has ranged in the 150's in the morning this last week.  States she tries to watch the salt and sugars in her diet.  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 States her leukemia is in remission and she sees Dr. [name] regularly.  States her chronic back pain is controlled with her pain medication RNCM Clinical Goal(s):  Patient will verbalize understanding of plan for management of Atrial Fibrillation, CHF, HTN, HLD, and DMII as evidenced by voiced adherence to plan of care verbalize basic understanding of  Atrial Fibrillation, CHF, HTN, HLD, and DMII disease process and self health management plan as evidenced by voiced understanding and teach back  take all medications exactly as prescribed and will call provider for medication related questions as evidenced by dispense report and pt verbalization  attend all scheduled medical appointments: Cardiology 05/19/22, Oncology 06/30/22 as evidenced by medical records demonstrate Improved adherence to prescribed treatment plan for Atrial Fibrillation, CHF, HTN, HLD, and DMII as evidenced by readings within limits, voiced adherence to plan of care continue to work with RN Care Manager to address care management and care coordination needs related to  Atrial Fibrillation, CHF, HTN, HLD, and DMII as evidenced by adherence to CM Team Scheduled appointments not experience hospital admission as evidenced by review of EMR. Hospital Admissions in last 6 months = 2  through collaboration with RN Care manager, provider, and care team.   Interventions: 1:1 collaboration with primary care provider regarding development and update of comprehensive plan of care as evidenced by provider attestation and co-signature Inter-disciplinary care team collaboration (see longitudinal plan of care) Evaluation of current treatment plan related to  self management and patient's adherence to plan as established by provider   AFIB  Interventions: (Status:  New goal.) Long Term Goal   Counseled on increased risk of stroke due to Afib and benefits of anticoagulation for stroke prevention Reviewed importance of adherence to anticoagulant exactly as prescribed Counseled on bleeding risk associated with Eliquis and importance of self-monitoring for signs/symptoms of bleeding Afib action plan reviewed   Heart Failure Interventions:  (Status:  New goal.) Long Term Goal Basic overview and discussion of pathophysiology of Heart Failure reviewed Provided education on low sodium diet Reviewed Heart Failure Action Plan in depth and provided written copy Assessed need for readable accurate scales in home Provided education about placing scale on hard, flat surface Advised patient to weigh each morning after emptying bladder Discussed importance of daily weight and advised patient to weigh and record daily Discussed the importance of keeping all appointments with provider Reviewed to weigh every day and keep log of weights  .  Mailed [place]  calendar   Diabetes Interventions:  (Status:  New goal.) Long Term Goal Assessed patient's understanding of A1c goal: <8% Provided education to patient about basic DM disease process Reviewed medications with patient and discussed importance of medication adherence Discussed plans with patient for ongoing care management follow up and provided patient with direct contact information for care management team Provided patient with written educational materials related to hypo and hyperglycemia and importance of correct treatment Advised patient, providing education and rationale, to check cbg daily and record, calling provider for findings outside established parameters Screening for signs and symptoms of depression related to chronic disease state  Assessed social determinant of health barriers Lab Results  Component Value Date   HGBA1C 8.0 (A) 04/16/2022   Hyperlipidemia  Interventions:  (Status:  New goal.) Long Term Goal Medication review performed; medication list updated in electronic medical record.  Counseled on importance of regular laboratory monitoring as prescribed Reviewed role and benefits of statin for ASCVD risk reduction  Hypertension Interventions:  (Status:  New goal.) Long Term Goal Last practice recorded BP readings:  BP Readings from Last 3 Encounters:  05/08/22 140/72  04/21/22 (!) 163/76  04/16/22 (!) 150/70  Most recent eGFR/CrCl:  Lab Results  Component Value Date   EGFR 37 (L) 09/04/2017    No components found for: CRCL  Evaluation of current treatment plan related to hypertension self management and patient's adherence to plan as established by provider Reviewed medications with patient and discussed importance of compliance Advised patient, providing education and rationale, to monitor blood pressure daily and record, calling PCP for findings outside established parameters  Patient Goals/Self-Care Activities: Take all medications as prescribed Attend all scheduled provider appointments Call pharmacy for medication refills 3-7 days in advance of running out of medications Call provider office for new concerns or questions  call office if I gain more than 2 pounds in one day or 5 pounds in one week keep legs up while sitting track weight in diary watch for swelling in feet, ankles and legs every day weigh myself daily follow rescue plan if symptoms flare-up eat more whole grains, fruits and vegetables, lean meats and healthy fats keep appointment with eye doctor check blood sugar at prescribed times: once daily and when you have symptoms of low or high blood sugar take the blood sugar log to all doctor visits fill half of plate with vegetables manage portion size switch to sugar-free drinks check pulse (heart) rate once a day make a plan to eat healthy take medicine as prescribed check blood pressure daily keep a  blood pressure log limit salt intake to 2038m/day call for medicine refill 2 or 3 days before it runs out take all medications exactly as prescribed call doctor with any symptoms you believe are related to your medicine  Follow Up Plan:  Telephone follow up appointment with care management team member scheduled for:  06/09/22 The patient has been provided with contact information for the care management team and has been advised to call with any health related questions or concerns.       Plan: Telephone follow up appointment with care management team member scheduled for:  06/09/22 The patient has been provided with contact information for the care management team and has been advised to call with any health related questions or concerns.   MPeter GarterRN, BJackquline Denmark CDE Care Management Coordinator [facility] ([phone]

## 2022-05-14 ENCOUNTER — Other Ambulatory Visit (HOSPITAL_BASED_OUTPATIENT_CLINIC_OR_DEPARTMENT_OTHER): Payer: Self-pay

## 2022-05-14 ENCOUNTER — Telehealth (HOSPITAL_BASED_OUTPATIENT_CLINIC_OR_DEPARTMENT_OTHER): Payer: Self-pay

## 2022-05-14 NOTE — Telephone Encounter (Signed)
Pharmacy Transitions of Care Follow-up Telephone Call  Date of discharge: 04/21/22  Discharge Diagnosis: CHF  How have you been since you were released from the hospital? Patient was currently at work, but just went over all of her medications last week with a nurse and had no further questions or concerns.   Medication changes made at discharge: START taking these medications  START taking these medications  Eliquis 2.5 MG Tabs tablet Generic drug: apixaban Take 1 tablet (2.5 mg total) by mouth 2 (two) times daily.  Entresto 24-26 MG Generic drug: sacubitril-valsartan Take 1 tablet by mouth 2 (two) times daily.  Jardiance 25 MG Tabs tablet Generic drug: empagliflozin Take 1 tablet (25 mg total) by mouth daily.  lidocaine 4 % Place 1 patch onto the skin daily.   CHANGE how you take these medications  CHANGE how you take these medications  cyanocobalamin 1000 MCG/ML injection Commonly known as: (VITAMIN B-12) INJECT 1ML IN THE MUSCLE EVERY 30 DAYS AS DIRECTED What changed:  how much to take how to take this when to take this additional instructions  lipase/protease/amylase 36000 UNITS Cpep capsule Commonly known as: Creon Take 2 capsules (72,000 Units total) by mouth 3 (three) times daily with meals. What changed:  when to take this reasons to take this  LORazepam 0.5 MG tablet Commonly known as: ATIVAN TAKE 1 TABLET BY MOUTH EVERY 8 HOURS AS NEEDED FOR ANXIETY(DO NOT EXCEED MORE THAN 2 TABS IN 24 HRS) What changed:  how much to take how to take this when to take this reasons to take this additional instructions  metoprolol succinate 25 MG 24 hr tablet Commonly known as: TOPROL-XL Take 1 tablet (25 mg total) by mouth daily. Take with or immediately following a meal. What changed:  medication strength how much to take additional instructions  torsemide 20 MG tablet Commonly known as: DEMADEX Take 2 tablets (40 mg total) by mouth daily. What changed: how much  to take  True Metrix Blood Glucose Test test strip Generic drug: glucose blood TEST BLOOD SUGAR THREE TIMES DAILY What changed:  when to take this additional instructions  True Metrix Go Glucose Meter w/Device Kit USE TO CHECK BLOOD SUGARS THREE TIMES A DAY AND PRN What changed:  when to take this additional instructions   CONTINUE taking these medications  CONTINUE taking these medications  atorvastatin 40 MG tablet Commonly known as: LIPITOR TAKE 1 TABLET EVERY DAY  BD SafetyGlide Syringe/Needle 25G X 1" 3 ML Misc Generic drug: SYRINGE-NEEDLE (DISP) 3 ML Use for B12 injections  clopidogrel 75 MG tablet Commonly known as: PLAVIX TAKE 1 TABLET EVERY EVENING  fluticasone furoate-vilanterol 200-25 MCG/ACT Aepb Commonly known as: BREO ELLIPTA  HYDROcodone-acetaminophen 5-325 MG tablet Commonly known as: Norco Take 1 tablet by mouth every 6 (six) hours as needed for moderate pain.  Insulin Pen Needle 32G X 4 MM Misc 1 Device by Does not apply route in the morning, at noon, in the evening, and at bedtime.  isosorbide mononitrate 30 MG 24 hr tablet Commonly known as: IMDUR Take 1 tablet (30 mg total) by mouth daily. TAKE 1 AND 1/2 TABLETS EVERY DAY  letrozole 2.5 MG tablet Commonly known as: FEMARA Take 1 tablet (2.5 mg total) by mouth daily.  levETIRAcetam 500 MG tablet Commonly known as: Keppra Take 1 tablet (500 mg total) by mouth 2 (two) times daily.  MAGNESIUM OXIDE 400 PO  nitroGLYCERIN 0.4 MG SL tablet Commonly known as: NITROSTAT PLACE 1 TABLET (0.4 MG TOTAL)  UNDER THE TONGUE EVERY 5 (FIVE) MINUTES AS NEEDED FOR CHEST PAIN.  polyethylene glycol 17 g packet Commonly known as: MIRALAX / GLYCOLAX  potassium chloride 10 MEQ tablet Commonly known as: KLOR-CON TAKE 1 TABLET EVERY DAY  Toujeo Max SoloStar 300 UNIT/ML Solostar Pen Generic drug: insulin glargine (2 Unit Dial) Inject 16 Units into the skin at bedtime.   STOP taking these medications  STOP taking these  medications  amLODipine 10 MG tablet Commonly known as: NORVASC    Medication changes verified by the patient? yes    Medication Accessibility:  Home Pharmacy: [facility]   Was the patient provided with refills on discharged medications? no   Have all prescriptions been transferred from [facility] to home pharmacy? N/a   Is the patient able to afford medications? yes Notable copays: 0 Eligible patient assistance: no    Medication Review: APIXABAN (ELIQUIS)  Apixaban 2.5 mg BID  - Discussed importance of taking medication around the same time everyday  - Reviewed potential DDIs with patient  - Advised patient of medications to avoid (NSAIDs, ASA)  - Educated that Tylenol (acetaminophen) will be the preferred analgesic to prevent risk of bleeding  - Emphasized importance of monitoring for signs and symptoms of bleeding (abnormal bruising, prolonged bleeding, nose bleeds, bleeding from gums, discolored urine, black tarry stools)  - Advised patient to alert all providers of anticoagulation therapy prior to starting a new medication or having a procedure   Follow-up Appointments: Date Visit Type Length Department    05/08/2022  3:00 PM HEART VASCULAR TOC NEW 60 min [facility] [place] [[id number]]  Patient Instructions:            05/12/2022  9:00 AM CCM TELEPHONE CALL 60 min [place] [facility]    05/19/2022  3:00 PM OFFICE VISIT 20 min [place] [facility] [[id number]]    If their condition worsens, is the pt aware to call PCP or go to the Emergency Dept.? yes  Final Patient Assessment: Patient was currently at work, but just went over all of her medications last week with a nurse and had no further questions or concerns.   Darcus Austin, [place] 05/14/2022 10:30 AM

## 2022-05-16 ENCOUNTER — Telehealth (HOSPITAL_COMMUNITY): Payer: Self-pay

## 2022-05-16 NOTE — Telephone Encounter (Addendum)
Letter mailed. ----- Message from Consuelo Pandy, PA-C sent at 05/09/2022  1:02 PM EDT ----- SCr up slightly. K low but Entrseto dose was increased.   Needs repeat BMP and BNP in 1wk

## 2022-05-18 NOTE — Progress Notes (Unsigned)
Cardiology Office Note:    Date:  05/19/2022   ID:  [name], DOB 26-Feb-1941, MRN [id number]  PCP:  Isaac Bliss, Rayford Halsted, MD  Cardiologist:  Annsleigh Dragoo   Referring MD: Isaac Bliss, Estel*   Chief Complaint  Patient presents with   Congestive Heart Failure        Coronary Artery Disease         Previous notes    [name] is a 81 y.o. female with a hx of coronary artery disease and a non-ST segment elevation myocardial infarction.  She was admitted in #2018 with a non-ST segment elevation myocardial infarction in the setting of acute combined systolic and diastolic congestive heart failure.  Her ejection fraction was 40-45%.  Patient has chronic kidney disease and we treat her medically in an effort to avoid cardiac catheterization at that time.  She was seen in follow-up on October 10 and continued to have shortness of breath and occasional chest tightness.  We ultimately did do heart cath which showed severe three-vessel coronary artery disease.  She was seen by Dr. Servando Snare for surgical consultation.  She was thought to be too high risk for CABG. She subsequently had PCI to her right coronary artery.  He was found to have a moderate LAD stenosis with an FFR of 0.72 at that time.   She is been treated medically with the thought that we would intervene on the LAD if she continued to have episodes of chest pain and shortness of breath.  She is feeling well.   Some dyspnea.   No CP   May 30, 2020:  [name]  is seen today for follow-up of her coronary artery disease and chronic combined systolic and diastolic congestive heart failure.  She was seen in the emergency room for congestive heart failure on June 1.  She was not admitted but was sent home with the home health CHF nurses.  She received IV Lasix for several days. She had a recent echocardiogram performed May 08, 2020 which reveals normal left ventricular systolic function with EF of 55 to 60%. There is trivial  mitral regurgitation.    She was changed from furosemide to torsemide and is still feeling quite well. Still has some leg edema.   Breathing is much better.  Tries to avoid salt  We discussed leg elevation and compression hose.   Labs from June 16 reveal a creatinine of 1.64.  Her potassium is normal. I considered trying an ARB but since her creatinine has gone up a little bit I suspect that she will be better off staying with what she is on now.   Oct. 28, 2021: [name] is seen today for follow up of her CAD and CHF. Eats fast foods several times a week . Has had some leg swelling.   Is also short of breath  Has hx of leukemia. Hb is down to 7.  This may be partially responsible for her dyspnea and leg swelling   Her medical doctor increased her Demadex to 60 mg a day  She does not think that she has put out much additional urine today   Dec. 13, 2021: [name] is seen today for follow up of her CAD and CHF She was very anemic when I last saw her in Oct.  She has a hx of acute myelogenous leukemia  Has had some leg swelling ,  Better after she increased her torsemide  She takes torsemide 40 daily, every so often she  will add torsemide 20 mg in the evening to help with swelling .  We will increase her prescribed dose so that she will have enough tablets and not run out .  Check lipids and liver enz.  Today    Feb. 17, 2023 [name] is seen today for follow up of her CAD , CHF, Was diagnosed with seizures in Dec.   Those are well controlled at this point  No cp Has chronic dyspnea.  Her anemia is better   HbA1C is elevated.   Glucose levels are improving   May 19, 2022 [name] is seen today for follow up of her CAD, CHF  Was in the hospital in May for Afib and chronic diastolic chf She was started on Eliquis 2.5 mg twice a day.  She has been itching since she was in the hospital ( may be related to Eliquis )  Was also started on Entresto  Was on Torsemide prior to hospitalization /  prior to itching  I doubt this itching is due to [place] suggests the itching may be the entresto.  Will DC entresto and try valsartan 80 mg a day   Having lots of leg swelling  Does not elevated her legs.  No compression hose  We discussed compression hose Lounge doctor leg rest  , she has a hospital bed that can raise her legs.    Past Medical History:  Diagnosis Date   Acute myelogenous leukemia ([place]) 02/2014   Acute pancreatitis    Arthritis    "back" (09/17/2017)   Atrial flutter ([place])    Cardiomyopathy ([place])    Chronic kidney disease, stage II (mild)    Chronic lower back pain    Colon polyps 04/29/2010   TUBULAR ADENOMA AND A SERRATED ADENOMA   COPD (chronic obstructive pulmonary disease) ([place])    CORONARY ARTERY DISEASE 12/24/2007   DIABETES MELLITUS, TYPE II 07/13/2007   History of blood transfusion 2015   "related to leukemia"   History of kidney stones 12/24/2007   History of uterine cancer    HYPERLIPIDEMIA 12/24/2007   HYPERTENSION 07/13/2007   NSTEMI (non-ST elevated myocardial infarction) ([place]) 09/17/2017   Uterine cancer ([place])     Past Surgical History:  Procedure Laterality Date   ABDOMINAL HYSTERECTOMY     "still have my ovaries"   BREAST BIOPSY Left 09/22/2019   BREAST LUMPECTOMY Left 10/24/2019   BREAST LUMPECTOMY WITH RADIOACTIVE SEED LOCALIZATION Left 10/24/2019   Procedure: LEFT BREAST LUMPECTOMY WITH RADIOACTIVE SEED LOCALIZATION;  Surgeon: Jovita Kussmaul, MD;  Location: [facility];  Service: General;  Laterality: Left;   CARDIAC CATHETERIZATION  2002   CARDIAC CATHETERIZATION  09/17/2017   CATARACT EXTRACTION W/ INTRAOCULAR LENS  IMPLANT, BILATERAL Bilateral    COLONOSCOPY W/ BIOPSIES AND POLYPECTOMY  2011   CORONARY STENT INTERVENTION N/A 09/21/2017   Procedure: CORONARY STENT INTERVENTION;  Surgeon: Troy Sine, MD;  Location: [facility];  Service: Cardiovascular;  Laterality: N/A;    DILATION AND CURETTAGE OF UTERUS     ESOPHAGOGASTRODUODENOSCOPY N/A 01/30/2022   Procedure: ESOPHAGOGASTRODUODENOSCOPY (EGD);  Surgeon: Milus Banister, MD;  Location: [facility];  Service: Endoscopy;  Laterality: N/A;   EUS N/A 01/30/2022   Procedure: UPPER ENDOSCOPIC ULTRASOUND (EUS) RADIAL;  Surgeon: Milus Banister, MD;  Location: [facility];  Service: Endoscopy;  Laterality: N/A;   EXCISIONAL HEMORRHOIDECTOMY  X 2   FINE NEEDLE ASPIRATION N/A 01/30/2022   Procedure: FINE  NEEDLE ASPIRATION (FNA) LINEAR;  Surgeon: Milus Banister, MD;  Location: [facility];  Service: Endoscopy;  Laterality: N/A;   GANGLION CYST EXCISION Left X 3   INTRAVASCULAR PRESSURE WIRE/FFR STUDY N/A 09/17/2017   Procedure: INTRAVASCULAR PRESSURE WIRE/FFR STUDY;  Surgeon: Nelva Bush, MD;  Location: [facility];  Service: Cardiovascular;  Laterality: N/A;   NASAL SINUS SURGERY     PORTA CATH INSERTION Right 2013   RIGHT/LEFT HEART CATH AND CORONARY ANGIOGRAPHY N/A 09/17/2017   Procedure: RIGHT/LEFT HEART CATH AND CORONARY ANGIOGRAPHY;  Surgeon: Nelva Bush, MD;  Location: [facility];  Service: Cardiovascular;  Laterality: N/A;   TUBAL LIGATION      Current Medications: Current Meds  Medication Sig   apixaban (ELIQUIS) 2.5 MG TABS tablet Take 1 tablet (2.5 mg total) by mouth 2 (two) times daily.   atorvastatin (LIPITOR) 40 MG tablet TAKE 1 TABLET EVERY DAY   Blood Glucose Monitoring Suppl (TRUE METRIX GO GLUCOSE METER) w/Device KIT USE TO CHECK BLOOD SUGARS THREE TIMES A DAY AND PRN   clopidogrel (PLAVIX) 75 MG tablet TAKE 1 TABLET EVERY EVENING   cyanocobalamin (,VITAMIN B-12,) 1000 MCG/ML injection INJECT 1ML IN THE MUSCLE EVERY 30 DAYS AS DIRECTED   empagliflozin (JARDIANCE) 25 MG TABS tablet Take 1 tablet (25 mg total) by mouth daily.   fluticasone furoate-vilanterol (BREO ELLIPTA) 200-25 MCG/ACT AEPB Inhale 1 puff into the lungs as needed (wheezing SOB).   glucose blood (TRUE  METRIX BLOOD GLUCOSE TEST) test strip TEST BLOOD SUGAR THREE TIMES DAILY   HYDROcodone-acetaminophen (NORCO) 5-325 MG tablet Take 1 tablet by mouth every 6 (six) hours as needed for moderate pain.   insulin glargine, 2 Unit Dial, (TOUJEO MAX SOLOSTAR) 300 UNIT/ML Solostar Pen Inject 16 Units into the skin at bedtime.   Insulin Pen Needle 32G X 4 MM MISC 1 Device by Does not apply route in the morning, at noon, in the evening, and at bedtime.   isosorbide mononitrate (IMDUR) 30 MG 24 hr tablet Take 1 tablet (30 mg total) by mouth daily. TAKE 1 AND 1/2 TABLETS EVERY DAY   letrozole (FEMARA) 2.5 MG tablet Take 1 tablet (2.5 mg total) by mouth daily.   levETIRAcetam (KEPPRA) 500 MG tablet Take 1 tablet (500 mg total) by mouth 2 (two) times daily.   lidocaine 4 % Place 1 patch onto the skin daily.   lipase/protease/amylase (CREON) 36000 UNITS CPEP capsule Take 2 capsules (72,000 Units total) by mouth 3 (three) times daily with meals.   LORazepam (ATIVAN) 0.5 MG tablet TAKE 1 TABLET BY MOUTH EVERY 8 HOURS AS NEEDED FOR ANXIETY(DO NOT EXCEED MORE THAN 2 TABS IN 24 HRS)   MAGNESIUM OXIDE 400 PO Take 400 mg by mouth 2 (two) times daily.   metoprolol succinate (TOPROL-XL) 25 MG 24 hr tablet Take 0.5 tablets (12.5 mg total) by mouth daily. Take with or immediately following a meal.   nitroGLYCERIN (NITROSTAT) 0.4 MG SL tablet PLACE 1 TABLET (0.4 MG TOTAL) UNDER THE TONGUE EVERY 5 (FIVE) MINUTES AS NEEDED FOR CHEST PAIN.   polyethylene glycol (MIRALAX / GLYCOLAX) 17 g packet Take 17 g by mouth daily as needed for mild constipation.   potassium chloride (KLOR-CON) 10 MEQ tablet TAKE 1 TABLET EVERY DAY   potassium chloride (KLOR-CON) 10 MEQ tablet Take 1 tablet by mouth twice daily for 3 days, then resume once daily dosing thereafter   SYRINGE-NEEDLE, DISP, 3 ML (BD SAFETYGLIDE SYRINGE/NEEDLE) 25G X 1" 3 ML MISC  Use for B12 injections   torsemide (DEMADEX) 20 MG tablet Take 2 tablets (40 mg total) by mouth daily.    torsemide (DEMADEX) 20 MG tablet Take 2 tablets ($RemoveBe'40mg'zGVNUHrca$ ) by mouth twice daily for 3 days, then resume once daily dosing thereafter   valsartan (DIOVAN) 80 MG tablet Take 1 tablet (80 mg total) by mouth daily.   [DISCONTINUED] sacubitril-valsartan (ENTRESTO) 49-51 MG Take 1 tablet by mouth 2 (two) times daily.     Allergies:   Patient has no known allergies.   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  Tobacco Use   Smoking status: Former    Packs/day: 1.00    Years: 20.00    Total pack years: 20.00    Types: Cigarettes    Quit date: 12/08/2000    Years since quitting: 21.4   Smokeless tobacco: Never  Vaping Use   Vaping Use: Never used  Substance and Sexual Activity   Alcohol use: No    Alcohol/week: 0.0 standard drinks of alcohol   Drug use: No   Sexual activity: Never  Other Topics Concern   Not on file  Social History Narrative   Still works full-time assembling eye glasses $RemoveBef'@75'xPEwDmCtgu$    Social Determinants of Health   Financial Resource Strain: Low Risk  (05/12/2022)   Overall Financial Resource Strain (CARDIA)    Difficulty of Paying Living Expenses: Not hard at all  Food Insecurity: No Food Insecurity (05/12/2022)   Hunger Vital Sign    Worried About Running Out of Food in the Last Year: Never true    [place] in the Last Year: Never true  Transportation Needs: No Transportation Needs (05/12/2022)   PRAPARE - Hydrologist (Medical): No    Lack of Transportation (Non-Medical): No  Physical Activity: Not on file  Stress: No Stress Concern Present (05/12/2022)   [place]    Feeling of Stress : Not at all  Social Connections: Not on file      Family History: The patient's family history includes Breast cancer (age of onset: 54) in her sister; COPD in her father; Cancer in her maternal uncle, niece, paternal aunt, and paternal uncle; Cervical cancer in her sister; Cirrhosis in her maternal uncle; Congestive Heart Failure in her mother; Esophageal cancer in her brother and father; Heart attack in her maternal grandfather, maternal grandmother, and paternal grandfather; Leukemia in her cousin; Lung cancer in her sister, sister, and sister; Skin cancer in her daughter and maternal grandfather; Stroke (age of onset: 98) in her paternal grandmother; Suicidality in her brother. There is no history of Colon cancer.  ROS:   Please see the history of present illness.     All other systems reviewed and are negative.  EKGs/Labs/Other Studies Reviewed:    The following studies were reviewed today:   EKG:    Recent Labs: 01/16/2022: TSH 2.570 04/16/2022: ALT 40; Magnesium 1.9 04/21/2022: Hemoglobin 10.1; Platelets 101 05/08/2022: B Natriuretic Peptide 447.5; BUN 25; Creatinine, Ser 1.92; Potassium 3.4; Sodium 142  Recent Lipid Panel    Component Value Date/Time   CHOL 112 11/19/2020 0827   TRIG 94 11/19/2020  0827   HDL 49 11/19/2020 0827   CHOLHDL 2.3 11/19/2020 0827   CHOLHDL 4.5 11/22/2017 0354   VLDL 44 (H) 11/22/2017 0354   LDLCALC 45 11/19/2020 0827    Physical Exam:     Physical Exam: Blood pressure 130/68, pulse 66, height $RemoveBe'5\' 2"'YMTcllZah$  (1.575 m), weight 162 lb 12.8 oz (73.8 kg), SpO2 97 %.  GEN: Elderly, chronically ill female, examined in wheelchair HEENT: Normal NECK: No JVD; No carotid bruits LYMPHATICS: No lymphadenopathy CARDIAC: RRR , no murmurs, rubs, gallops RESPIRATORY:  Clear to auscultation without rales, wheezing or rhonchi  ABDOMEN: Soft, non-tender, non-distended MUSCULOSKELETAL: 2-3+ bilateral pitting edema.  Right greater than left. SKIN: Warm and dry NEUROLOGIC:  Alert and oriented x 3     ASSESSMENT:     1. Chronic combined systolic and diastolic heart failure ([place])   2. Essential hypertension     PLAN:     1.  Coronary artery disease:   She denies having any angina.   2.  Essential hypertension:    Blood pressure looks fairly well controlled.   3.  Chronic diastolic congestive heart failure:: She was started on Entresto during the hospitalization.  She has been itching since that time.  We will discontinue the Entresto and try her on valsartan 80 mg a day to see if this helps with the itching.  She has significant leg edema.  She spends much of the day with her legs down.  I have encouraged her to elevate her legs especially at night.  I given her instructions on how to order a lounge doctor leg rest.  We will increase her torsemide to 40 mg twice a day for 3 days and increase her potassium chloride to 10 mEq twice a day for 3 days.  Hopefully this will help with some of her leg edema.     4.  Hyperlipidemia :   Managed by her primary medical doctor.  5.  Total body itching: She has had itching since she left the hospital.  She was started on Entresto as well as Eliquis.  Our Pharm.D. thinks that it is more likely the [facility].  We will discontinue Entresto and start her on valsartan alone to see if this helps.  If that does not solve the problem then we may consider starting her on hydralazine instead of the valsartan.  She return to see an APP in 3 months.  Medication Adjustments/Labs and Tests Ordered: Current medicines are reviewed at length with the patient today.  Concerns regarding medicines are outlined above.  Orders Placed This Encounter  Procedures   Basic metabolic panel   Meds ordered this encounter  Medications   valsartan (DIOVAN) 80 MG tablet    Sig: Take 1 tablet (80 mg total) by mouth daily.    Dispense:  90 tablet    Refill:  3    Please d/c entresto   torsemide (DEMADEX) 20 MG tablet    Sig: Take 2 tablets ($RemoveBe'40mg'QFzTakywe$ ) by mouth twice daily for 3 days, then  resume once daily dosing thereafter    Dispense:  120 tablet    Refill:  3   potassium chloride (KLOR-CON) 10 MEQ tablet    Sig: Take 1 tablet by mouth twice daily for 3 days, then resume once daily dosing thereafter    Dispense:  90 tablet    Refill:  3     Signed, Mertie Moores, MD  05/19/2022 5:50 PM    [place]

## 2022-05-19 ENCOUNTER — Ambulatory Visit: Payer: Medicare HMO | Admitting: Cardiovascular Disease

## 2022-05-19 ENCOUNTER — Encounter: Payer: Self-pay | Admitting: Cardiovascular Disease

## 2022-05-19 VITALS — BP 130/68 | HR 66 | Ht 62.0 in | Wt 162.8 lb

## 2022-05-19 DIAGNOSIS — I5042 Chronic combined systolic (congestive) and diastolic (congestive) heart failure: Secondary | ICD-10-CM

## 2022-05-19 DIAGNOSIS — I10 Essential (primary) hypertension: Secondary | ICD-10-CM | POA: Diagnosis not present

## 2022-05-19 MED ORDER — VALSARTAN 80 MG PO TABS: 80.0000 mg | ORAL_TABLET | Freq: Every day | ORAL | 3 refills | Status: DC

## 2022-05-19 MED ORDER — POTASSIUM CHLORIDE ER 10 MEQ PO TBCR: EXTENDED_RELEASE_TABLET | ORAL | 3 refills | Status: DC

## 2022-05-19 MED ORDER — TORSEMIDE 20 MG PO TABS: ORAL_TABLET | ORAL | 3 refills | Status: DC

## 2022-05-19 NOTE — Patient Instructions (Signed)
For your  leg edema you  should do  the following 1. Leg elevation - I recommend the Lounge Dr. Leg rest.  See below for details  2. Salt restriction  -  Use potassium chloride instead of regular salt as a salt substitute. 3. Walk regularly 4. Compression hose - [facility] [employer] store  5. Weight loss   Available on [email or web address] Or  Go to [email or web address]    Medication Instructions:  STOP Entresto START Valsartan '80mg'$  daily INCREASE Torsemide to '40mg'$  twice daily for 3 days, then resume at once daily INCREASE Potassium to 32mq twice daily for 3 days, then resume to once daily *If you need a refill on your cardiac medications before your next appointment, please call your pharmacy*   Lab Work: BMET today If you have labs (blood work) drawn today and your tests are completely normal, you will receive your results only by: MMaud(if you have MyChart) OR A paper copy in the mail If you have any lab test that is abnormal or we need to change your treatment, we will call you to review the results.   Testing/Procedures: NONE   Follow-Up: [facility] and your health needs are our priority.  As part of our continuing mission to provide you with exceptional heart care, we have created designated Provider Care Teams.  These Care Teams include your primary Cardiologist (physician) and Advanced Practice Providers (APPs -  Physician Assistants and Nurse Practitioners) who all work together to provide you with the care you need, when you need it.  We recommend signing up for the patient portal called "MyChart".  Sign up information is provided on this After Visit Summary.  MyChart is used to connect with patients for Virtual Visits (Telemedicine).  Patients are able to view lab/test results, encounter notes, upcoming appointments, etc.  Non-urgent messages can be sent to your provider as well.   To learn more about what you can do with MyChart, go to [email or web address]     Your next appointment:   3 month(s)  The format for your next appointment:   In Person  Provider:   MChristen Bameor SRichardson Dopp{     Important Information About Sugar

## 2022-05-20 LAB — BASIC METABOLIC PANEL
BUN/Creatinine Ratio: 17 (ref 12–28)
BUN: 26 mg/dL (ref 8–27)
CO2: 19 mmol/L — ABNORMAL LOW (ref 20–29)
Calcium: 7.2 mg/dL — ABNORMAL LOW (ref 8.7–10.3)
Chloride: 113 mmol/L — ABNORMAL HIGH (ref 96–106)
Creatinine, Ser: 1.49 mg/dL — ABNORMAL HIGH (ref 0.57–1.00)
Glucose: 113 mg/dL — ABNORMAL HIGH (ref 70–99)
Potassium: 3.5 mmol/L (ref 3.5–5.2)
Sodium: 147 mmol/L — ABNORMAL HIGH (ref 134–144)
eGFR: 35 mL/min/{1.73_m2} — ABNORMAL LOW (ref >59)

## 2022-06-09 ENCOUNTER — Ambulatory Visit: Payer: Medicare HMO

## 2022-06-09 DIAGNOSIS — I10 Essential (primary) hypertension: Secondary | ICD-10-CM

## 2022-06-09 DIAGNOSIS — N1832 Chronic kidney disease, stage 3b: Secondary | ICD-10-CM

## 2022-06-09 DIAGNOSIS — I480 Paroxysmal atrial fibrillation: Secondary | ICD-10-CM

## 2022-06-09 DIAGNOSIS — I5043 Acute on chronic combined systolic (congestive) and diastolic (congestive) heart failure: Secondary | ICD-10-CM

## 2022-06-09 NOTE — Patient Instructions (Addendum)
Visit Information  Thank you for taking time to visit with me today. Please don't hesitate to contact me if I can be of assistance to you before our next scheduled telephone appointment.  Following are the goals we discussed today:  Take all medications as prescribed Attend all scheduled provider appointments Call pharmacy for medication refills 3-7 days in advance of running out of medications Call provider office for new concerns or questions  call office if I gain more than 2 pounds in one day or 5 pounds in one week keep legs up while sitting track weight in diary watch for swelling in feet, ankles and legs every day weigh myself daily follow rescue plan if symptoms flare-up eat more whole grains, fruits and vegetables, lean meats and healthy fats keep appointment with eye doctor check blood sugar at prescribed times: once daily and when you have symptoms of low or high blood sugar take the blood sugar log to all doctor visits fill half of plate with vegetables manage portion size switch to sugar-free drinks check pulse (heart) rate once a day make a plan to eat healthy take medicine as prescribed check blood pressure daily keep a blood pressure log limit salt intake to $RemoveB'2000mg'gqMxCizX$ /day call for medicine refill 2 or 3 days before it runs out take all medications exactly as prescribed call doctor with any symptoms you believe are related to your medicine Type 2 Diabetes Mellitus, Self-Care, Adult When you have type 2 diabetes (type 2 diabetes mellitus), you must make sure your blood sugar (glucose) stays in a healthy range. You can do this with: Nutrition. Exercise. Lifestyle changes. Medicines or insulin, if needed. Support from your doctors and others. What are the risks? Having type 2 diabetes can raise your risk for other long-term (chronic) health problems. You may get medicines to help prevent these problems. How to stay aware of your blood sugar  Check your blood sugar  level every day, as often as told. Have your A1C (hemoglobin A1C) level checked two or more times a year. Have it checked more often if told. Your doctor will set personal treatment goals for you. In general, you should have these blood sugar levels: Before meals: 80-130 mg/dL (4.4-7.2 mmol/L). After meals: below 180 mg/dL (10 mmol/L). A1C: less than 7%. How to manage high and low blood sugar Symptoms of high blood sugar High blood sugar is also called hyperglycemia. Know the symptoms of high blood sugar. These may include: More thirst. Hunger. Feeling very tired. Needing to pee (urinate) more often than normal. Seeing things blurry. Symptoms of low blood sugar Low blood sugar is also called hypoglycemia. This is when blood sugar is at or below 70 mg/dL (3.9 mmol/L). Symptoms may include: Hunger. Feeling worried or nervous (anxious). Feeling sweaty and cold to the touch (clammy). Being dizzy or light-headed. Feeling sleepy. A fast heartbeat. Feeling grouchy (irritable). Tingling or loss of feeling (numbness) around your mouth, lips, or tongue. Restless sleep. Diabetes medicines can cause low blood sugar. You are more at risk: While you exercise. After exercise. During sleep. When you are sick. When you skip meals or do not eat for a long time. Treating low blood sugar If you think you have low blood sugar, eat or drink something sugary right away. Keep 15 grams of a fast-acting carb (carbohydrate) with you all the time. Make sure your family and friends know how to treat you if you cannot treat yourself. Treating very low blood sugar Severe hypoglycemia is when your  blood sugar is at or below 54 mg/dL (3 mmol/L). Severe hypoglycemia is an emergency. Get medical help right away. Call your local emergency services (911 in the [place] Do not wait to see if the symptoms will go away. Do not drive yourself to the hospital. You may need a glucagon shot if you have very low blood sugar  and you cannot eat or drink. Have a family member or friend learn how to check your blood sugar and how to give you a glucagon shot. Ask your doctor if you should have a kit for glucagon shots. Follow these instructions at home: Medicines Take prescribed insulin or diabetes medicines as told by your health care provider. Do not run out of insulin or other medicines. Plan ahead. If you use insulin, change the amount you take based on how active you are and what foods you eat. Your doctor will tell you how to do this. Take over-the-counter and prescription medicines only as told by your doctor. Eating and drinking  Eat healthy foods. These include: Low-fat (lean) proteins. Complex carbs, such as whole grains. Fresh fruits and vegetables. Low-fat dairy products. Healthy fats. Meet with a food expert (dietitian) to make an eating plan. Follow instructions from your doctor about what you cannot eat or drink. Drink enough fluid to keep your pee (urine) pale yellow. Keep track of carbs that you eat. Read food labels and learn serving sizes of foods. Follow your sick-day plan when you cannot eat or drink as normal. Make this plan with your doctor so it is ready to use. Activity Exercise as told by your doctor. You may need to: Do stretching and strength exercises two or more times a week. Do 150 minutes or more of exercise each week that makes your heart beat faster and makes you sweat. Spread out your exercise over 3 or more days a week. Do not go more than 2 days in a row without exercise. Talk with your doctor before you start a new exercise. Your doctor may tell you to change: How much insulin or medicines you take. How much food you eat. Lifestyle Do not smoke or use any products that contain nicotine or tobacco. If you need help quitting, ask your doctor. If you drink alcohol and your doctor says that it is safe for you: Limit how much you have to: 0-1 drink a day for women who are not  pregnant. 0-2 drinks a day for men. Know how much alcohol is in your drink. In the [place] one drink equals one 12 oz bottle of beer (355 mL), one 5 oz glass of wine (148 mL), or one 1 oz glass of hard liquor (44 mL). Learn to deal with stress. If you need help, ask your doctor. Body care  Stay up to date with your shots (immunizations). Have your eyes and feet checked by a doctor as often as told. Check your skin and feet every day. Check for cuts, bruises, redness, blisters, or sores. Brush your teeth and gums two times a day. Floss one or more times a day. Go to the dentist one or more times every 6 months. Stay at a healthy weight. General instructions Share your diabetes care plan with: Your work or school. People you live with. Carry a card or wear jewelry that says you have diabetes. Keep all follow-up visits. Questions to ask your doctor Do I need to meet with a certified expert in diabetes education and care? Where can I find a support  group? Where to find more information For help and guidance and more information about diabetes, please go to: American Diabetes Association: www.diabetes.org American Association of Diabetes Care and Education Specialists: www.diabeteseducator.org International Diabetes Federation: MemberVerification.ca Summary When you have type 2 diabetes, you must make sure your blood sugar (glucose) stays in a healthy range. You can do this with nutrition, exercise, medicines and insulin, and support from doctors and others. Check your blood sugar every day, or as often as told. Having diabetes can raise your risk for other long-term health problems. You may get medicines to help prevent these problems. Share your diabetes management plan with people at work, school, and home. Keep all follow-up visits. This information is not intended to replace advice given to you by your health care provider. Make sure you discuss any questions you have with your health care  provider. Document Revised: 02/18/2021 Document Reviewed: 02/18/2021 Elsevier Patient Education  [place] next appointment is by telephone on 07/08/22 at 9 AM  Please call the care guide team at [phone] if you need to cancel or reschedule your appointment.   If you are experiencing a Mental Health or [place] or need someone to talk to, please call the Suicide and Crisis Lifeline: 988 call the [place] National Suicide Prevention Lifeline: 937-368-1646 or TTY: 401-104-9602 TTY 561-126-8108) to talk to a trained counselor call 1-800-273-TALK (toll free, 24 hour hotline) go to [facility] [address], [place] [phone]) call 911   The patient verbalized understanding of instructions, educational materials, and care plan provided today and agreed to receive a mailed copy of patient instructions, educational materials, and care plan.   Telephone follow up appointment with care management team member scheduled for: The patient has been provided with contact information for the care management team and has been advised to call with any health related questions or concerns.   Peter Garter RN, Jackquline Denmark, CDE Care Management Coordinator [facility] [phone]

## 2022-06-09 NOTE — Chronic Care Management (AMB) (Signed)
Care Management    RN Visit Note  06/09/2022 Name: [name] MRN: [id number] DOB: [birth year]  Subjective: [name] is a 81 y.o. year old female who is a primary care patient of Isaac Bliss, Rayford Halsted, MD. The care management team was consulted for assistance with disease management and care coordination needs.    Engaged with patient by telephone for follow up visit in response to provider referral for case management and/or care coordination services.   Consent to Services:   Ms. Mccranie was given information about Care Management services today including:  Care Management services includes personalized support from designated clinical staff supervised by her physician, including individualized plan of care and coordination with other care providers 24/7 contact phone numbers for assistance for urgent and routine care needs. The patient may stop case management services at any time by phone call to the office staff.  Patient agreed to services and consent obtained.   Assessment: Review of patient past medical history, allergies, medications, health status, including review of consultants reports, laboratory and other test data, was performed as part of comprehensive evaluation and provision of chronic care management services.   SDOH (Social Determinants of Health) assessments and interventions performed:    Care Plan  No Known Allergies  Outpatient Encounter Medications as of 06/09/2022  Medication Sig   apixaban (ELIQUIS) 2.5 MG TABS tablet Take 1 tablet (2.5 mg total) by mouth 2 (two) times daily.   atorvastatin (LIPITOR) 40 MG tablet TAKE 1 TABLET EVERY DAY   Blood Glucose Monitoring Suppl (TRUE METRIX GO GLUCOSE METER) w/Device KIT USE TO CHECK BLOOD SUGARS THREE TIMES A DAY AND PRN   clopidogrel (PLAVIX) 75 MG tablet TAKE 1 TABLET EVERY EVENING   cyanocobalamin (,VITAMIN B-12,) 1000 MCG/ML injection INJECT 1ML IN THE MUSCLE EVERY 30 DAYS AS DIRECTED   empagliflozin  (JARDIANCE) 25 MG TABS tablet Take 1 tablet (25 mg total) by mouth daily.   fluticasone furoate-vilanterol (BREO ELLIPTA) 200-25 MCG/ACT AEPB Inhale 1 puff into the lungs as needed (wheezing SOB).   glucose blood (TRUE METRIX BLOOD GLUCOSE TEST) test strip TEST BLOOD SUGAR THREE TIMES DAILY   HYDROcodone-acetaminophen (NORCO) 5-325 MG tablet Take 1 tablet by mouth every 6 (six) hours as needed for moderate pain.   insulin glargine, 2 Unit Dial, (TOUJEO MAX SOLOSTAR) 300 UNIT/ML Solostar Pen Inject 16 Units into the skin at bedtime.   Insulin Pen Needle 32G X 4 MM MISC 1 Device by Does not apply route in the morning, at noon, in the evening, and at bedtime.   isosorbide mononitrate (IMDUR) 30 MG 24 hr tablet Take 1 tablet (30 mg total) by mouth daily. TAKE 1 AND 1/2 TABLETS EVERY DAY   letrozole (FEMARA) 2.5 MG tablet Take 1 tablet (2.5 mg total) by mouth daily.   levETIRAcetam (KEPPRA) 500 MG tablet Take 1 tablet (500 mg total) by mouth 2 (two) times daily.   lidocaine 4 % Place 1 patch onto the skin daily.   lipase/protease/amylase (CREON) 36000 UNITS CPEP capsule Take 2 capsules (72,000 Units total) by mouth 3 (three) times daily with meals.   LORazepam (ATIVAN) 0.5 MG tablet TAKE 1 TABLET BY MOUTH EVERY 8 HOURS AS NEEDED FOR ANXIETY(DO NOT EXCEED MORE THAN 2 TABS IN 24 HRS)   MAGNESIUM OXIDE 400 PO Take 400 mg by mouth 2 (two) times daily.   metoprolol succinate (TOPROL-XL) 25 MG 24 hr tablet Take 0.5 tablets (12.5 mg total) by mouth daily. Take with  or immediately following a meal.   nitroGLYCERIN (NITROSTAT) 0.4 MG SL tablet PLACE 1 TABLET (0.4 MG TOTAL) UNDER THE TONGUE EVERY 5 (FIVE) MINUTES AS NEEDED FOR CHEST PAIN.   polyethylene glycol (MIRALAX / GLYCOLAX) 17 g packet Take 17 g by mouth daily as needed for mild constipation.   potassium chloride (KLOR-CON) 10 MEQ tablet TAKE 1 TABLET EVERY DAY   potassium chloride (KLOR-CON) 10 MEQ tablet Take 1 tablet by mouth twice daily for 3 days, then  resume once daily dosing thereafter   SYRINGE-NEEDLE, DISP, 3 ML (BD SAFETYGLIDE SYRINGE/NEEDLE) 25G X 1" 3 ML MISC Use for B12 injections   torsemide (DEMADEX) 20 MG tablet Take 2 tablets (40 mg total) by mouth daily.   torsemide (DEMADEX) 20 MG tablet Take 2 tablets (25m) by mouth twice daily for 3 days, then resume once daily dosing thereafter   valsartan (DIOVAN) 80 MG tablet Take 1 tablet (80 mg total) by mouth daily.   Facility-Administered Encounter Medications as of 06/09/2022  Medication   sodium chloride 0.9 % 1,000 mL with magnesium sulfate 2 g infusion    Patient Active Problem List   Diagnosis Date Noted   Paroxysmal atrial fibrillation ([facility]    Abdominal pain 04/17/2022   Pulmonary nodule 04/17/2022   DNR (do not resuscitate) 04/17/2022   Acute renal failure superimposed on stage 4 chronic kidney disease ([facility] 12/06/2021   Hyperglycemia 12/06/2021   Seizure ([facility] 12/05/2021   Acute on chronic diastolic CHF (congestive heart failure) ([facility] 05/30/2020   Genetic testing 12/29/2019   Family history of esophageal cancer    Family history of lung cancer    Family history of cervical cancer    Family history of skin cancer    Family history of leukemia    History of uterine cancer    Carcinoma of upper-outer quadrant of left breast in female, estrogen receptor positive ([facility] 10/17/2019   Type 2 diabetes mellitus with hyperglycemia, with [name]-term current use of insulin ([facility] 05/04/2019   Type 2 diabetes mellitus with stage 3 chronic kidney disease, with [name]-term current use of insulin ([facility] 05/04/2019   Diabetes mellitus ([facility] 05/04/2019   Morbid (severe) obesity due to excess calories ([facility] 10/27/2018   Localization-related (focal) (partial) symptomatic epilepsy and epileptic syndromes with complex partial seizures, not intractable, without status epilepticus ([facility] 10/27/2018   Expressive aphasia 11/21/2017   Hyponatremia 11/21/2017   TIA (transient ischemic attack)     Chest pain 09/24/2017   History of non-ST elevation myocardial infarction (NSTEMI)    Chronic combined systolic and diastolic heart failure ([facility]    Vitamin B12 deficiency 10/29/2016   Port catheter in place 05/30/2016   Other pancytopenia ([facility] 08/09/2014   Leukemia, acute ([facility] 06/08/2014   History of TIA (transient ischemic attack) 05/24/2014   CKD (chronic kidney disease) stage 4, GFR 15-29 ml/min (HCC) 05/24/2014   AML (acute myeloblastic leukemia) ([facility] [place] 05/24/2014   Acute leukemia ([facility] 03/02/2014   Cervical spine pain 02/24/2014   Alkaline phosphatase elevation 02/24/2014   Chronic back pain 02/13/2014   PAF- flutter 02/04/2014   Pancreatitis 01/29/2014   Lesion of pancreas 01/29/2014   HIP PAIN, LEFT 01/26/2008   Hyperlipidemia 12/24/2007   CAD (coronary artery disease) 12/24/2007   CHEST PAIN, ATYPICAL 12/24/2007   NEPHROLITHIASIS, HX OF 12/24/2007   Insulin dependent diabetes mellitus 07/13/2007   Essential hypertension 07/13/2007    Conditions to be addressed/monitored: Atrial Fibrillation, CHF, HTN, HLD, and DMII  Care Plan : RN Care Manager  Plan of Care  Updates made by Dimitri Ped, RN since 06/09/2022 12:00 AM     Problem: Disease Management and Care Coordination Needs (CHF, DM, Atrial fibrillation,  HTN, HLD)   Priority: High     [name]-Range Goal: Establish Plan of Care for Care Coordination and Disease Management Needs(CHF, DM, Atrial fibrillation,  HTN, HLD)   Start Date: 05/12/2022  Expected End Date: 11/08/2022  Priority: High  Note:   Current Barriers:  Knowledge Deficits related to plan of care for management of Atrial Fibrillation, CHF, HTN, HLD, and DMII  States her weights have gone down and she weighted 150 this morning. States she is wearing her compression hose more and she is has bought the pillow to elevate her legs that Dr. Acie Fredrickson recommended.  States she is still itching all over and changing the Entresto did not help.  States she gets short  of breath with exertion and denies any chest pains. States she checks her CBG daily and has ranged in the 150's in the morning this last week.  States she tries to watch the salt and sugars in her diet.  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 States her leukemia is in remission and she sees Dr. [name] regularly.  States her chronic back pain is controlled with her pain medication. States she needs refills on her pain medications RNCM Clinical Goal(s):  Patient will verbalize understanding of plan for management of Atrial Fibrillation, CHF, HTN, HLD, and DMII as evidenced by voiced adherence to plan of care verbalize basic understanding of  Atrial Fibrillation, CHF, HTN, HLD, and DMII disease process and self health management plan as evidenced by voiced understanding and teach back  take all medications exactly as prescribed and will call provider for medication related questions as evidenced by dispense report and pt verbalization  attend all scheduled medical appointments: Cardiology 05/19/22, Oncology 06/30/22 as evidenced by medical records demonstrate Improved adherence to prescribed treatment plan for Atrial Fibrillation, CHF, HTN, HLD, and DMII as evidenced by readings within limits, voiced adherence to plan of care continue to work with RN Care Manager to address care management and care coordination needs related to  Atrial Fibrillation, CHF, HTN, HLD, and DMII as evidenced by adherence to CM Team Scheduled appointments not experience hospital admission as evidenced by review of EMR. Hospital Admissions in last 6 months = 2  through collaboration with RN Care manager, provider, and care team.   Interventions: 1:1 collaboration with primary care provider regarding development and update of comprehensive plan of care as evidenced by provider attestation and co-signature Inter-disciplinary care team collaboration (see longitudinal plan of care) Evaluation of current  treatment plan related to  self management and patient's adherence to plan as established by provider Communicated to provider of pts continued itching and need for refills of hydrocodone/acetaminophen and lorazepam    AFIB Interventions: (Status:  Goal on track:  Yes.) [name] Term Goal   Counseled on increased risk of stroke due to Afib and benefits of anticoagulation for stroke prevention Reviewed importance of adherence to anticoagulant exactly as prescribed Counseled on bleeding risk associated with Eliquis and importance of self-monitoring for signs/symptoms of bleeding Counseled on seeking medical attention after a head injury or if there is blood in the urine/stool Afib action plan reviewed   Heart Failure Interventions:  (Status:  Goal on track:  Yes.) [name] Term Goal Basic overview and discussion of pathophysiology of Heart Failure reviewed Provided education  on low sodium diet Reviewed Heart Failure Action Plan in depth and provided written copy Assessed need for readable accurate scales in home Provided education about placing scale on hard, flat surface Advised patient to weigh each morning after emptying bladder Discussed importance of daily weight and advised patient to weigh and record daily Discussed the importance of keeping all appointments with provider Reviewed to continue to wear her compression hose especially when she is at work and to keep legs elevated as much as possible. Reinforced to weigh every day and keep log of weights   Diabetes Interventions:  (Status:  Goal on track:  Yes.) [name] Term Goal Assessed patient's understanding of A1c goal: <8% Provided education to patient about basic DM disease process Reviewed medications with patient and discussed importance of medication adherence Discussed plans with patient for ongoing care management follow up and provided patient with direct contact information for care management team Provided patient with written  educational materials related to hypo and hyperglycemia and importance of correct treatment Advised patient, providing education and rationale, to check cbg daily and record, calling provider for findings outside established parameters Reviewed to follow a low carbohydrate low salt diet and to watch portion sizes Lab Results  Component Value Date   HGBA1C 8.0 (A) 04/16/2022   Hyperlipidemia Interventions:  (Status:  Goal on track:  Yes.) [name] Term Goal Medication review performed; medication list updated in electronic medical record.  Counseled on importance of regular laboratory monitoring as prescribed Reviewed role and benefits of statin for ASCVD risk reduction Reviewed importance of limiting foods high in cholesterol  Hypertension Interventions:  (Status:  Goal on track:  Yes.) [name] Term Goal Last practice recorded BP readings:  BP Readings from Last 3 Encounters:  05/19/22 130/68  05/08/22 140/72  04/21/22 (!) 163/76  Most recent eGFR/CrCl:  Lab Results  Component Value Date   EGFR 37 (L) 09/04/2017    No components found for: CRCL  Evaluation of current treatment plan related to hypertension self management and patient's adherence to plan as established by provider Reviewed medications with patient and discussed importance of compliance Advised patient, providing education and rationale, to monitor blood pressure daily and record, calling PCP for findings outside established parameters Provided education on prescribed diet low CHO, low sodium  Patient Goals/Self-Care Activities: Take all medications as prescribed Attend all scheduled provider appointments Call pharmacy for medication refills 3-7 days in advance of running out of medications Call provider office for new concerns or questions  call office if I gain more than 2 pounds in one day or 5 pounds in one week keep legs up while sitting track weight in diary watch for swelling in feet, ankles and legs every  day weigh myself daily follow rescue plan if symptoms flare-up eat more whole grains, fruits and vegetables, lean meats and healthy fats keep appointment with eye doctor check blood sugar at prescribed times: once daily and when you have symptoms of low or high blood sugar take the blood sugar log to all doctor visits fill half of plate with vegetables manage portion size switch to sugar-free drinks check pulse (heart) rate once a day make a plan to eat healthy take medicine as prescribed check blood pressure daily keep a blood pressure log limit salt intake to 2069m/day call for medicine refill 2 or 3 days before it runs out take all medications exactly as prescribed call doctor with any symptoms you believe are related to your medicine  Follow Up Plan:  Telephone follow up appointment with care management team member scheduled for:  07/08/22 The patient has been provided with contact information for the care management team and has been advised to call with any health related questions or concerns.       Plan: Telephone follow up appointment with care management team member scheduled for:  07/08/22 The patient has been provided with contact information for the care management team and has been advised to call with any health related questions or concerns.   Peter Garter RN, Jackquline Denmark, CDE Care Management Coordinator [facility] [phone]

## 2022-06-30 ENCOUNTER — Inpatient Hospital Stay: Payer: Medicare HMO | Admitting: Oncology

## 2022-06-30 ENCOUNTER — Inpatient Hospital Stay: Payer: Medicare HMO | Attending: Oncology

## 2022-06-30 ENCOUNTER — Inpatient Hospital Stay: Payer: Medicare HMO

## 2022-06-30 VITALS — BP 134/60 | HR 58 | Temp 98.1°F | Resp 18 | Ht 62.0 in | Wt 149.0 lb

## 2022-06-30 DIAGNOSIS — Z7901 Long term (current) use of anticoagulants: Secondary | ICD-10-CM | POA: Insufficient documentation

## 2022-06-30 DIAGNOSIS — I509 Heart failure, unspecified: Secondary | ICD-10-CM | POA: Insufficient documentation

## 2022-06-30 DIAGNOSIS — D649 Anemia, unspecified: Secondary | ICD-10-CM | POA: Insufficient documentation

## 2022-06-30 DIAGNOSIS — C7951 Secondary malignant neoplasm of bone: Secondary | ICD-10-CM | POA: Diagnosis not present

## 2022-06-30 DIAGNOSIS — Z79811 Long term (current) use of aromatase inhibitors: Secondary | ICD-10-CM | POA: Diagnosis not present

## 2022-06-30 DIAGNOSIS — I4891 Unspecified atrial fibrillation: Secondary | ICD-10-CM | POA: Diagnosis not present

## 2022-06-30 DIAGNOSIS — Z170 Estrogen receptor positive status [ER+]: Secondary | ICD-10-CM | POA: Diagnosis not present

## 2022-06-30 DIAGNOSIS — C50912 Malignant neoplasm of unspecified site of left female breast: Secondary | ICD-10-CM | POA: Insufficient documentation

## 2022-06-30 DIAGNOSIS — Z95828 Presence of other vascular implants and grafts: Secondary | ICD-10-CM

## 2022-06-30 DIAGNOSIS — Z452 Encounter for adjustment and management of vascular access device: Secondary | ICD-10-CM | POA: Diagnosis not present

## 2022-06-30 DIAGNOSIS — D696 Thrombocytopenia, unspecified: Secondary | ICD-10-CM | POA: Insufficient documentation

## 2022-06-30 DIAGNOSIS — I2510 Atherosclerotic heart disease of native coronary artery without angina pectoris: Secondary | ICD-10-CM | POA: Insufficient documentation

## 2022-06-30 DIAGNOSIS — C9201 Acute myeloblastic leukemia, in remission: Secondary | ICD-10-CM | POA: Diagnosis not present

## 2022-06-30 DIAGNOSIS — Z86711 Personal history of pulmonary embolism: Secondary | ICD-10-CM | POA: Insufficient documentation

## 2022-06-30 LAB — CBC WITH DIFFERENTIAL (CANCER CENTER ONLY)
Abs Immature Granulocytes: 0.01 K/uL (ref 0.00–0.07)
Basophils Absolute: 0 K/uL (ref 0.0–0.1)
Basophils Relative: 0 %
Eosinophils Absolute: 0.2 K/uL (ref 0.0–0.5)
Eosinophils Relative: 4 %
HCT: 31.8 % — ABNORMAL LOW (ref 36.0–46.0)
Hemoglobin: 10.4 g/dL — ABNORMAL LOW (ref 12.0–15.0)
Immature Granulocytes: 0 %
Lymphocytes Relative: 22 %
Lymphs Abs: 1.3 K/uL (ref 0.7–4.0)
MCH: 30.1 pg (ref 26.0–34.0)
MCHC: 32.7 g/dL (ref 30.0–36.0)
MCV: 91.9 fL (ref 80.0–100.0)
Monocytes Absolute: 0.5 K/uL (ref 0.1–1.0)
Monocytes Relative: 9 %
Neutro Abs: 3.7 K/uL (ref 1.7–7.7)
Neutrophils Relative %: 65 %
Platelet Count: 117 K/uL — ABNORMAL LOW (ref 150–400)
RBC: 3.46 MIL/uL — ABNORMAL LOW (ref 3.87–5.11)
RDW: 13.7 % (ref 11.5–15.5)
WBC Count: 5.7 K/uL (ref 4.0–10.5)
nRBC: 0 % (ref 0.0–0.2)

## 2022-06-30 MED ORDER — HEPARIN SOD (PORK) LOCK FLUSH 100 UNIT/ML IV SOLN
500.0000 [IU] | Freq: Once | INTRAVENOUS | Status: AC
  Administered 2022-06-30: 500 [IU] via INTRAVENOUS

## 2022-06-30 MED ORDER — SODIUM CHLORIDE 0.9% FLUSH
10.0000 mL | Freq: Once | INTRAVENOUS | Status: AC
  Administered 2022-06-30: 10 mL via INTRAVENOUS

## 2022-06-30 NOTE — Progress Notes (Signed)
[facility] OFFICE PROGRESS NOTE   Diagnosis: AML, breast cancer, pancreas lesion  INTERVAL HISTORY:   [name] was admitted 04/16/2022 with congestive heart failure.  A CT during that hospital admission revealed a stable pancreas lesion and evidence of "bone metastases ".  She continues letrozole.  No hot flashes.  Chronic arthralgias.  Good appetite.  She is working.  A bilateral mammogram 03/21/2022 revealed no evidence of malignancy.  Objective:  Vital signs in last 24 hours:  Blood pressure 134/60, pulse (!) 58, temperature 98.1 F (36.7 C), temperature source Oral, resp. rate 18, height $RemoveBe'5\' 2"'WcZTNHPHB$  (1.575 m), weight 149 lb (67.6 kg), SpO2 100 %.    Lymphatics: No cervical, supraclavicular, axillary, or inguinal nodes.  Prominent bilateral axillary fat pad, slightly nodular on the left greater than right Resp: Lungs clear bilaterally Cardio: Regular rhythm GI: No hepatosplenomegaly Vascular: Trace edema at the left greater than right lower leg  Breast: Status post left lumpectomy.  No evidence for local tumor recurrence.  No mass in either breast   Lab Results:  Lab Results  Component Value Date   WBC 5.7 06/30/2022   HGB 10.4 (L) 06/30/2022   HCT 31.8 (L) 06/30/2022   MCV 91.9 06/30/2022   PLT 117 (L) 06/30/2022   NEUTROABS 3.7 06/30/2022    CMP  Lab Results  Component Value Date   NA 147 (H) 05/19/2022   K 3.5 05/19/2022   CL 113 (H) 05/19/2022   CO2 19 (L) 05/19/2022   GLUCOSE 113 (H) 05/19/2022   BUN 26 05/19/2022   CREATININE 1.49 (H) 05/19/2022   CALCIUM 7.2 (L) 05/19/2022   PROT 5.8 (L) 04/16/2022   ALBUMIN 2.9 (L) 04/16/2022   AST 61 (H) 04/16/2022   ALT 40 04/16/2022   ALKPHOS 175 (H) 04/16/2022   BILITOT 0.7 04/16/2022   GFRNONAA 26 (L) 05/08/2022   GFRAA 39 (L) 10/04/2020    Lab Results  Component Value Date   CEA1 9.5 (H) 12/08/2021   YIF027 32 01/22/2022     Medications: I have reviewed the patient's current  medications.   Assessment/Plan:  Acute myelogenous leukemia, monocytic differentiation diagnosed in March of 2015   Induction cytarabine/daunorubicin (7+3) followed by reinduction with cytarabine/daunorubicin and zinecard (5+2) with a recovering bone marrow 05/16/2014 consistent with remission   Status post cycle 1 high-dose araC consolidation 05/30/2014   Status post cycle 2 high-dose araC consolidation 07/11/2014. Status post cycle 3 high-dose araC consolidation 08/22/2014. Bone marrow 09/15/2014 showed necrotic marrow with minute focus of myeloid precursors. No evidence of viable acute leukemia. 2.  History of pancytopenia secondary to chemotherapy.   3. C. difficile colitis-06/09/2014 treated with Flagyl   4. Diabetes   5. renal insufficiency   6. history of hypokalemia and hypomagnesemia. 7. history of atrial fibrillation   9. history of coronary artery disease   10. Hospitalization with febrile neutropenia/sepsis secondary to Escherichia coli bacteremia 09/02/2014. 11. Shingles involving the right back October 2015. 12. Meningioma on brain CT 05/24/2014 and brain MRI 05/24/2014; brain CT 12/05/2021-stable calcified meningioma along the anterior falx; brain MRI 12/06/2021 with meningioma measuring 17 mm.  Seen by neurosurgery, no acute neurosurgical intervention indicated. 22. Hospitalization 08/24/2017 through 08/28/2017 with acute CHF. 14.  Admission 11/21/2017 with expressive aphasia, seizure activity noted on EEG, placed on Keppra 15.  Left breast cancer, T1NX, status post a left lumpectomy 10/24/2019-1.5 cm, grade 2, associated with high-grade DCIS with necrosis, ER 95%, PR 95%, HER-2 negative, KI-6710% Letrozole 11/02/2019 16.  ER 05/08/2020-dyspnea CT chest 05/08/2020-negative for pulmonary embolism, new patchy sclerosis throughout the thoracic skeleton concerning for bone metastases MRI thoracic spine 06/08/2020-consistent with successfully treated marrow replacement process in  2015, current area of concern at T9 could represent a benign vascular lesion 17.  Admission 12/05/2021 through 12/09/2021 with seizures, hyperglycemia 18.  CT abdomen/pelvis 12/07/2021-cystic pancreas head lesion, cirrhotic liver, lytic and sclerotic bone lesions.  Bone lesions felt to  19.  MRI abdomen 12/09/2021-large cystic lesion within the head of the pancreas, cirrhosis, no suspicious bone lesions on upper EUS 01/30/2022-well-circumscribed complex cystic lesion in the head of the pancreas, measures 4.6 cm, appears to contain gelatinous consistency material and clearly communicates with a dilated main pancreatic duct.  Cytology, no malignant cells identified.  Treated with Cipro.  Per GI note repeat CT 3 to 4 months.6 CT abdomen/pelvis 04/17/2022-unchanged complex lesion in the pancreas head, cirrhosis with indeterminate right liver lesion (no abnormality seen on MRI), lytic and sclerotic bone lesions, few right pulmonary nodules-unchanged compared to 2015 20.  Admission with congestive heart failure 04/16/2022    Disposition: [name] remains in clinical remission from breast cancer.  She will continue letrozole.  She is followed by Dr. Ardis Hughs for the cystic pancreas head mass, unchanged on a CT 04/17/2022.  "Bone lesions "noted on repeat imaging studies are likely related to changes from treatment of AML.  She remains in remission from AML.  The persistent mild thrombocytopenia and anemia are likely related to cirrhosis with the anemia in part due to renal insufficiency.  [name] would like to keep the Port-A-Cath in place.  She will return for Port-A-Cath flush in 8 weeks.  She will be scheduled for an office visit in 16 weeks.  Betsy Coder, MD  06/30/2022  3:28 PM

## 2022-07-08 ENCOUNTER — Ambulatory Visit: Payer: Medicare HMO

## 2022-07-08 DIAGNOSIS — I5043 Acute on chronic combined systolic (congestive) and diastolic (congestive) heart failure: Secondary | ICD-10-CM

## 2022-07-08 DIAGNOSIS — I480 Paroxysmal atrial fibrillation: Secondary | ICD-10-CM

## 2022-07-08 DIAGNOSIS — N1832 Chronic kidney disease, stage 3b: Secondary | ICD-10-CM

## 2022-07-08 NOTE — Chronic Care Management (AMB) (Signed)
Care Management    RN Visit Note  07/08/2022 Name: [name] MRN: [id number] DOB: 06-06-1941  Subjective: [name] is a 81 y.o. year old female who is a primary care patient of Philip Aspen, Limmie Patricia, MD. The care management team was consulted for assistance with disease management and care coordination needs.    Engaged with patient by telephone for follow up visit in response to provider referral for case management and/or care coordination services.   Consent to Services:   Ms. Jenson was given information about Care Management services today including:  Care Management services includes personalized support from designated clinical staff supervised by her physician, including individualized plan of care and coordination with other care providers 24/7 contact phone numbers for assistance for urgent and routine care needs. The patient may stop case management services at any time by phone call to the office staff.  Patient agreed to services and consent obtained.   Assessment: Review of patient past medical history, allergies, medications, health status, including review of consultants reports, laboratory and other test data, was performed as part of comprehensive evaluation and provision of chronic care management services.   SDOH (Social Determinants of Health) assessments and interventions performed:    Care Plan  No Known Allergies  Outpatient Encounter Medications as of 07/08/2022  Medication Sig   apixaban (ELIQUIS) 2.5 MG TABS tablet Take 1 tablet (2.5 mg total) by mouth 2 (two) times daily.   atorvastatin (LIPITOR) 40 MG tablet TAKE 1 TABLET EVERY DAY   Blood Glucose Monitoring Suppl (TRUE METRIX GO GLUCOSE METER) w/Device KIT USE TO CHECK BLOOD SUGARS THREE TIMES A DAY AND PRN   clopidogrel (PLAVIX) 75 MG tablet TAKE 1 TABLET EVERY EVENING   cyanocobalamin (,VITAMIN B-12,) 1000 MCG/ML injection INJECT IN THE MUSCLE EVERY 30 DAYS AS DIRECTED   empagliflozin  (JARDIANCE) 25 MG TABS tablet Take 1 tablet (25 mg total) by mouth daily.   fluticasone furoate-vilanterol (BREO ELLIPTA) 200-25 MCG/ACT AEPB Inhale 1 puff into the lungs as needed (wheezing SOB).   glucose blood (TRUE METRIX BLOOD GLUCOSE TEST) test strip TEST BLOOD SUGAR THREE TIMES DAILY   HYDROcodone-acetaminophen (NORCO) 5-325 MG tablet Take 1 tablet by mouth every 6 (six) hours as needed for moderate pain.   insulin glargine, 2 Unit Dial, (TOUJEO MAX SOLOSTAR) 300 UNIT/ML Solostar Pen Inject 16 Units into the skin at bedtime.   Insulin Pen Needle 32G X 4 MM MISC 1 Device by Does not apply route in the morning, at noon, in the evening, and at bedtime.   isosorbide mononitrate (IMDUR) 30 MG 24 hr tablet Take 1 tablet (30 mg total) by mouth daily. TAKE 1 AND 1/2 TABLETS EVERY DAY   letrozole (FEMARA) 2.5 MG tablet Take 1 tablet (2.5 mg total) by mouth daily.   levETIRAcetam (KEPPRA) 500 MG tablet Take 1 tablet (500 mg total) by mouth 2 (two) times daily.   lidocaine 4 % Place 1 patch onto the skin daily. (Patient not taking: Reported on 06/30/2022)   lipase/protease/amylase (CREON) 36000 UNITS CPEP capsule Take 2 capsules (72,000 Units total) by mouth 3 (three) times daily with meals. (Patient taking differently: Take 72,000 Units by mouth as needed.)   LORazepam (ATIVAN) 0.5 MG tablet TAKE 1 TABLET BY MOUTH EVERY 8 HOURS AS NEEDED FOR ANXIETY(DO NOT EXCEED MORE THAN 2 TABS IN 24 HRS)   MAGNESIUM OXIDE 400 PO Take 400 mg by mouth 2 (two) times daily.   metoprolol succinate (TOPROL-XL)  25 MG 24 hr tablet Take 0.5 tablets (12.5 mg total) by mouth daily. Take with or immediately following a meal.   nitroGLYCERIN (NITROSTAT) 0.4 MG SL tablet PLACE 1 TABLET (0.4 MG TOTAL) UNDER THE TONGUE EVERY 5 (FIVE) MINUTES AS NEEDED FOR CHEST PAIN. (Patient not taking: Reported on 06/30/2022)   polyethylene glycol (MIRALAX / GLYCOLAX) 17 g packet Take 17 g by mouth daily as needed for mild constipation.   potassium  chloride (KLOR-CON) 10 MEQ tablet TAKE 1 TABLET EVERY DAY   potassium chloride (KLOR-CON) 10 MEQ tablet Take 1 tablet by mouth twice daily for 3 days, then resume once daily dosing thereafter   SYRINGE-NEEDLE, DISP, 3 ML (BD SAFETYGLIDE SYRINGE/NEEDLE) 25G X 1" 3 ML MISC Use for B12 injections   torsemide (DEMADEX) 20 MG tablet Take 2 tablets (40 mg total) by mouth daily.   torsemide (DEMADEX) 20 MG tablet Take 2 tablets ($RemoveBe'40mg'uoRidggGK$ ) by mouth twice daily for 3 days, then resume once daily dosing thereafter   valsartan (DIOVAN) 80 MG tablet Take 1 tablet (80 mg total) by mouth daily.   Facility-Administered Encounter Medications as of 07/08/2022  Medication   sodium chloride 0.9 % 1,000 mL with magnesium sulfate 2 g infusion    Patient Active Problem List   Diagnosis Date Noted   Paroxysmal atrial fibrillation ([place])    Abdominal pain 04/17/2022   Pulmonary nodule 04/17/2022   DNR (do not resuscitate) 04/17/2022   Acute renal failure superimposed on stage 4 chronic kidney disease ([place]) 12/06/2021   Hyperglycemia 12/06/2021   Seizure ([place]) 12/05/2021   Acute on chronic diastolic CHF (congestive heart failure) ([place]) 05/30/2020   Genetic testing 12/29/2019   Family history of esophageal cancer    Family history of lung cancer    Family history of cervical cancer    Family history of skin cancer    Family history of leukemia    History of uterine cancer    Carcinoma of upper-outer quadrant of left breast in female, estrogen receptor positive ([place]) 10/17/2019   Type 2 diabetes mellitus with hyperglycemia, with long-term current use of insulin ([place]) 05/04/2019   Type 2 diabetes mellitus with stage 3 chronic kidney disease, with long-term current use of insulin ([place]) 05/04/2019   Diabetes mellitus ([place]) 05/04/2019   Morbid (severe) obesity due to excess calories ([place]) 10/27/2018   Localization-related (focal) (partial) symptomatic epilepsy and epileptic syndromes with complex partial seizures, not  intractable, without status epilepticus ([place]) 10/27/2018   Expressive aphasia 11/21/2017   Hyponatremia 11/21/2017   TIA (transient ischemic attack)    Chest pain 09/24/2017   History of non-ST elevation myocardial infarction (NSTEMI)    Chronic combined systolic and diastolic heart failure ([place])    Vitamin B12 deficiency 10/29/2016   Port catheter in place 05/30/2016   Other pancytopenia ([place]) 08/09/2014   Leukemia, acute ([place]) 06/08/2014   History of TIA (transient ischemic attack) 05/24/2014   CKD (chronic kidney disease) stage 4, GFR 15-29 ml/min ([facility]) 05/24/2014   AML (acute myeloblastic leukemia) ([place]) 05/24/2014   Acute leukemia ([place]) 03/02/2014   Cervical spine pain 02/24/2014   Alkaline phosphatase elevation 02/24/2014   Chronic back pain 02/13/2014   PAF- flutter 02/04/2014   Pancreatitis 01/29/2014   Lesion of pancreas 01/29/2014   HIP PAIN, LEFT 01/26/2008   Hyperlipidemia 12/24/2007   CAD (coronary artery disease) 12/24/2007   CHEST PAIN, ATYPICAL 12/24/2007   NEPHROLITHIASIS, HX OF 12/24/2007   Insulin dependent diabetes mellitus 07/13/2007   Essential hypertension  07/13/2007    Conditions to be addressed/monitored: Atrial Fibrillation, CHF, HTN, and DMII  Care Plan : [place] of Care  Updates made by Dimitri Ped, RN since 07/08/2022 12:00 AM  Completed 07/08/2022   Problem: Disease Management and Care Coordination Needs (CHF, DM, Atrial fibrillation,  HTN, HLD) Resolved 07/08/2022  Priority: High     Long-Range Goal: Establish Plan of Care for Care Coordination and Disease Management Needs(CHF, DM, Atrial fibrillation,  HTN, HLD) Completed 07/08/2022  Start Date: 05/12/2022  Expected End Date: 11/08/2022  Priority: High  Note:   Case closed goals met Current Barriers:  Knowledge Deficits related to plan of care for management of Atrial Fibrillation, CHF, HTN, HLD, and DMII  States her weights have gone down and she weighted 149 this morning.  States she is wearing her compression hose and she is using the pillow to elevate her legs that Dr. Acie Fredrickson recommended.  States her itching comes and goes. States she gets short of breath with exertion and denies any chest pains. States she checks her CBG daily and has ranged in the 150's in the morning this last week.  States she tries to watch the salt and sugars in her diet.  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 States her leukemia is in remission and she sees Dr. [name] regularly.  States her chronic back pain is controlled with her pain medication. States she does not feel she needs RNCM calls anymore RNCM Clinical Goal(s):  Patient will verbalize understanding of plan for management of Atrial Fibrillation, CHF, HTN, HLD, and DMII as evidenced by voiced adherence to plan of care verbalize basic understanding of  Atrial Fibrillation, CHF, HTN, HLD, and DMII disease process and self health management plan as evidenced by voiced understanding and teach back  take all medications exactly as prescribed and will call provider for medication related questions as evidenced by dispense report and pt verbalization  attend all scheduled medical appointments: Cardiology 08/21/22 as evidenced by medical records demonstrate Improved adherence to prescribed treatment plan for Atrial Fibrillation, CHF, HTN, HLD, and DMII as evidenced by readings within limits, voiced adherence to plan of care continue to work with RN Care Manager to address care management and care coordination needs related to  Atrial Fibrillation, CHF, HTN, HLD, and DMII as evidenced by adherence to CM Team Scheduled appointments not experience hospital admission as evidenced by review of EMR. Hospital Admissions in last 6 months = 2  through collaboration with RN Care manager, provider, and care team.   Interventions: 1:1 collaboration with primary care provider regarding development and update of comprehensive plan  of care as evidenced by provider attestation and co-signature Inter-disciplinary care team collaboration (see longitudinal plan of care) Evaluation of current treatment plan related to  self management and patient's adherence to plan as established by provider Communicated to provider of pts continued itching and need for refills of hydrocodone/acetaminophen and lorazepam    AFIB Interventions: (Status:  Goal Met.) Long Term Goal   Counseled on increased risk of stroke due to Afib and benefits of anticoagulation for stroke prevention Reviewed importance of adherence to anticoagulant exactly as prescribed Counseled on bleeding risk associated with Eliquis and importance of self-monitoring for signs/symptoms of bleeding Counseled on seeking medical attention after a head injury or if there is blood in the urine/stool Afib action plan reviewed   Heart Failure Interventions:  (Status:  Goal Met.) Long Term Goal Basic  overview and discussion of pathophysiology of Heart Failure reviewed Provided education on low sodium diet Reviewed Heart Failure Action Plan in depth and provided written copy Assessed need for readable accurate scales in home Provided education about placing scale on hard, flat surface Advised patient to weigh each morning after emptying bladder Discussed importance of daily weight and advised patient to weigh and record daily Discussed the importance of keeping all appointments with provider Reinforced to continue to wear her compression hose especially when she is at work and to keep legs elevated as much as possible. Reinforced to weigh every day and keep log of weights   Diabetes Interventions:  (Status:  Goal Met.) Long Term Goal Assessed patient's understanding of A1c goal: <8% Provided education to patient about basic DM disease process Reviewed medications with patient and discussed importance of medication adherence Discussed plans with patient for ongoing care  management follow up and provided patient with direct contact information for care management team Provided patient with written educational materials related to hypo and hyperglycemia and importance of correct treatment Advised patient, providing education and rationale, to check cbg daily and record, calling provider for findings outside established parameters  Reviewed to have foot exam regularly/ Reinforced to follow a low carbohydrate low salt diet and to watch portion sizes Lab Results  Component Value Date   HGBA1C 8.0 (A) 04/16/2022   Hyperlipidemia Interventions:  (Status:  Goal Met.) Long Term Goal Medication review performed; medication list updated in electronic medical record.  Counseled on importance of regular laboratory monitoring as prescribed Reviewed role and benefits of statin for ASCVD risk reduction Reviewed importance of limiting foods high in cholesterol  Hypertension Interventions:  (Status:  Goal Met.) Long Term Goal Last practice recorded BP readings:  BP Readings from Last 3 Encounters:  06/30/22 134/60  05/19/22 130/68  05/08/22 140/72  Most recent eGFR/CrCl:  Lab Results  Component Value Date   EGFR 37 (L) 09/04/2017    No components found for: CRCL  Evaluation of current treatment plan related to hypertension self management and patient's adherence to plan as established by provider Reviewed medications with patient and discussed importance of compliance Advised patient, providing education and rationale, to monitor blood pressure daily and record, calling PCP for findings outside established parameters Provided education on prescribed diet low CHO, low sodium  Patient Goals/Self-Care Activities: Take all medications as prescribed Attend all scheduled provider appointments Call pharmacy for medication refills 3-7 days in advance of running out of medications Call provider office for new concerns or questions  call office if I gain more than 2 pounds  in one day or 5 pounds in one week keep legs up while sitting track weight in diary watch for swelling in feet, ankles and legs every day weigh myself daily follow rescue plan if symptoms flare-up eat more whole grains, fruits and vegetables, lean meats and healthy fats keep appointment with eye doctor check blood sugar at prescribed times: once daily and when you have symptoms of low or high blood sugar take the blood sugar log to all doctor visits fill half of plate with vegetables manage portion size switch to sugar-free drinks check pulse (heart) rate once a day make a plan to eat healthy take medicine as prescribed check blood pressure daily keep a blood pressure log limit salt intake to $RemoveB'2000mg'gzSsxLZG$ /day call for medicine refill 2 or 3 days before it runs out take all medications exactly as prescribed call doctor with any symptoms you believe are related  to your medicine  Follow Up Plan:  The patient has been provided with contact information for the care management team and has been advised to call with any health related questions or concerns.  No further follow up required: Case closed goals met       Plan: The patient has been provided with contact information for the care management team and has been advised to call with any health related questions or concerns.  No further follow up required: Case closed goals met  Peter Garter RN, [facility], CDE Care Management Coordinator [facility] [phone]

## 2022-07-08 NOTE — Patient Instructions (Signed)
Visit Information Case closed goals met Thank you for allowing me to share the care management and care coordination services that are available to you as part of your health plan and services through your primary care provider and medical home. Please reach out to me at [phone] if the care management/care coordination team may be of assistance to you in the future.   Lynniah Janoski RN, BSN,CCM, CDE Care Management Coordinator [facility] [phone]   

## 2022-08-05 ENCOUNTER — Other Ambulatory Visit: Payer: Self-pay | Admitting: Internal Medicine

## 2022-08-05 DIAGNOSIS — E1165 Type 2 diabetes mellitus with hyperglycemia: Secondary | ICD-10-CM

## 2022-08-05 DIAGNOSIS — G8929 Other chronic pain: Secondary | ICD-10-CM

## 2022-08-05 DIAGNOSIS — F419 Anxiety disorder, unspecified: Secondary | ICD-10-CM

## 2022-08-05 NOTE — Telephone Encounter (Signed)
Patient needs Hydrocodone and Lorazepam called in to [facility] on [address].  Patient needs all the rest of her medications called in to [facility].

## 2022-08-07 MED ORDER — HYDROCODONE-ACETAMINOPHEN 5-325 MG PO TABS: 1.0000 | ORAL_TABLET | Freq: Four times a day (QID) | ORAL | 0 refills | Status: DC | PRN

## 2022-08-07 MED ORDER — LORAZEPAM 0.5 MG PO TABS: ORAL_TABLET | ORAL | 0 refills | Status: DC

## 2022-08-07 NOTE — Telephone Encounter (Signed)
Refill sent.

## 2022-08-12 ENCOUNTER — Telehealth: Payer: Self-pay | Admitting: Internal Medicine

## 2022-08-12 ENCOUNTER — Other Ambulatory Visit: Payer: Self-pay | Admitting: Internal Medicine

## 2022-08-12 DIAGNOSIS — N1832 Chronic kidney disease, stage 3b: Secondary | ICD-10-CM

## 2022-08-12 NOTE — Progress Notes (Unsigned)
Name: [name]  Age/ Sex: 81 y.o., female   MRN/ DOB: [id number], May 19, 1941     PCP: Isaac Bliss, Rayford Halsted, MD   Reason for Endocrinology Evaluation: Type 2 Diabetes Mellitus  Initial Endocrine Consultative Visit: 05/04/2019    PATIENT IDENTIFIER: [name] is a 81 y.o. 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 The patient has followed with Endocrinology clinic since 05/04/2019 for consultative assistance with management of her diabetes.  DIABETIC HISTORY:  [name] was diagnosed with T2DM in 2008, she was on metformin, SU and pioglitazone has been on and started insulin therapy many years ago. Her hemoglobin A1c has ranged from 6.6% in 2015, peaking at 12.4% in 2018.   Dexcom cost prohibitive 04/2021    She continues to work at an [employer] place.   SUBJECTIVE:   During the last visit (08/14/2021): A1c was 8.0 % , We adjusted MDI regimen      Today (08/13/2022): Ms. [name] is here for a follow up on diabetes management.  She has not been to our clinic in a year.  She checks her blood sugars occasionally .She denies hypoglycemic episodes.  She is getting a new meter through her insurance    She continues to follow-up with oncology with AML in remission Admission for CHF May/2023 She was seen by GI for incidental finding of a pancreatic head lesion  Yesterday she didn't take her novolog with Supper, Post meal Bg was 400 mg/dL last night , took 10 units of novolog post meal followed by .   She is not on Jardiance per pt due to cost  Denies nausea or vomiting  Has occasional loose stools    HOME DIABETES REGIMEN:  Toujeo 16 units QHS  Novolog  10 units TID QAC CF: Novolog (BG -140/25)       GLUCOSE METER: did not bring       HISTORY:  Past Medical History:  Past Medical History:  Diagnosis Date   Acute myelogenous leukemia ([place]) 02/2014   Acute pancreatitis     Arthritis    "back" (09/17/2017)   Atrial flutter ([facility])    Cardiomyopathy ([place])    Chronic kidney disease, stage II (mild)    Chronic lower back pain    Colon polyps 04/29/2010   TUBULAR ADENOMA AND A SERRATED ADENOMA   COPD (chronic obstructive pulmonary disease) ([place])    CORONARY ARTERY DISEASE 12/24/2007   DIABETES MELLITUS, TYPE II 07/13/2007   History of blood transfusion 2015   "related to leukemia"   History of kidney stones 12/24/2007   History of uterine cancer    HYPERLIPIDEMIA 12/24/2007   HYPERTENSION 07/13/2007   NSTEMI (non-ST elevated myocardial infarction) ([place]) 09/17/2017   Uterine cancer ([place])    Past Surgical History:  Past Surgical History:  Procedure Laterality Date   ABDOMINAL HYSTERECTOMY     "still have my ovaries"   BREAST BIOPSY Left 09/22/2019   BREAST LUMPECTOMY Left 10/24/2019   BREAST LUMPECTOMY WITH RADIOACTIVE SEED LOCALIZATION Left 10/24/2019   Procedure: LEFT BREAST LUMPECTOMY WITH RADIOACTIVE SEED LOCALIZATION;  Surgeon: Jovita Kussmaul, MD;  Location: [facility];  Service: General;  Laterality: Left;   CARDIAC CATHETERIZATION  2002   CARDIAC CATHETERIZATION  09/17/2017   CATARACT EXTRACTION W/ INTRAOCULAR LENS  IMPLANT, BILATERAL Bilateral    COLONOSCOPY W/ BIOPSIES AND POLYPECTOMY  2011   CORONARY STENT INTERVENTION N/A 09/21/2017  Procedure: CORONARY STENT INTERVENTION;  Surgeon: Troy Sine, MD;  Location: [facility];  Service: Cardiovascular;  Laterality: N/A;   DILATION AND CURETTAGE OF UTERUS     ESOPHAGOGASTRODUODENOSCOPY N/A 01/30/2022   Procedure: ESOPHAGOGASTRODUODENOSCOPY (EGD);  Surgeon: Milus Banister, MD;  Location: [facility];  Service: Endoscopy;  Laterality: N/A;   EUS N/A 01/30/2022   Procedure: UPPER ENDOSCOPIC ULTRASOUND (EUS) RADIAL;  Surgeon: Milus Banister, MD;  Location: [facility];  Service: Endoscopy;  Laterality: N/A;   EXCISIONAL HEMORRHOIDECTOMY  X 2   FINE NEEDLE ASPIRATION N/A  01/30/2022   Procedure: FINE NEEDLE ASPIRATION (FNA) LINEAR;  Surgeon: Milus Banister, MD;  Location: [facility];  Service: Endoscopy;  Laterality: N/A;   GANGLION CYST EXCISION Left X 3   INTRAVASCULAR PRESSURE WIRE/FFR STUDY N/A 09/17/2017   Procedure: INTRAVASCULAR PRESSURE WIRE/FFR STUDY;  Surgeon: Nelva Bush, MD;  Location: [facility];  Service: Cardiovascular;  Laterality: N/A;   NASAL SINUS SURGERY     PORTA CATH INSERTION Right 2013   RIGHT/LEFT HEART CATH AND CORONARY ANGIOGRAPHY N/A 09/17/2017   Procedure: RIGHT/LEFT HEART CATH AND CORONARY ANGIOGRAPHY;  Surgeon: Nelva Bush, MD;  Location: [facility];  Service: Cardiovascular;  Laterality: N/A;   TUBAL LIGATION     Social History:  reports that she quit smoking about 21 years ago. Her smoking use included cigarettes. She has a 20.00 pack-year smoking history. She has never used smokeless tobacco. She reports that she does not drink alcohol and does not use drugs. Family History:  Family History  Problem Relation Age of Onset   COPD Father    Esophageal cancer Father    Breast cancer Sister 31   Lung cancer Sister    Esophageal cancer Brother    Suicidality Brother    Lung cancer Sister    Lung cancer Sister    Cervical cancer Sister    Congestive Heart Failure Mother    Heart attack Maternal Grandmother    Skin cancer Maternal Grandfather    Heart attack Maternal Grandfather    Stroke Paternal Grandmother 69   Heart attack Paternal Grandfather    Cancer Maternal Uncle        unknown type, dx. >65   Cancer Paternal Aunt        unknown type, dx. >50   Cancer Paternal Uncle        unknown type, dx. >50   Cirrhosis Maternal Uncle    Skin cancer Daughter    Leukemia Cousin        maternal first cousin; dx. >50, in remission   Cancer Niece        unknown type behind her ear, dx. 30s/40s   Colon cancer Neg Hx      HOME MEDICATIONS: Allergies as of 08/13/2022   No Known Allergies       Medication List        Accurate as of August 13, 2022  1:18 PM. If you have any questions, ask your nurse or doctor.          atorvastatin 40 MG tablet Commonly known as: LIPITOR TAKE 1 TABLET EVERY DAY   BD SafetyGlide Syringe/Needle 25G X 1" 3 ML Misc Generic drug: SYRINGE-NEEDLE (DISP) 3 ML Use for B12 injections   clopidogrel 75 MG tablet Commonly known as: PLAVIX TAKE 1 TABLET EVERY EVENING   cyanocobalamin 1000 MCG/ML injection Commonly known as: VITAMIN B12 INJECT 1ML IN THE MUSCLE EVERY 30 DAYS AS  DIRECTED   Eliquis 2.5 MG Tabs tablet Generic drug: apixaban Take 1 tablet (2.5 mg total) by mouth 2 (two) times daily.   empagliflozin 25 MG Tabs tablet Commonly known as: JARDIANCE Take 1 tablet (25 mg total) by mouth daily.   fluticasone furoate-vilanterol 200-25 MCG/ACT Aepb Commonly known as: BREO ELLIPTA Inhale 1 puff into the lungs as needed (wheezing SOB).   HYDROcodone-acetaminophen 5-325 MG tablet Commonly known as: Norco Take 1 tablet by mouth every 6 (six) hours as needed for moderate pain.   Insulin Pen Needle 32G X 4 MM Misc 1 Device by Does not apply route in the morning, at noon, in the evening, and at bedtime.   isosorbide mononitrate 30 MG 24 hr tablet Commonly known as: IMDUR Take 1 tablet (30 mg total) by mouth daily. TAKE 1 AND 1/2 TABLETS EVERY DAY   letrozole 2.5 MG tablet Commonly known as: FEMARA Take 1 tablet (2.5 mg total) by mouth daily.   levETIRAcetam 500 MG tablet Commonly known as: Keppra Take 1 tablet (500 mg total) by mouth 2 (two) times daily.   lidocaine 4 % Place 1 patch onto the skin daily.   lipase/protease/amylase 36000 UNITS Cpep capsule Commonly known as: Creon Take 2 capsules (72,000 Units total) by mouth 3 (three) times daily with meals. What changed:  when to take this reasons to take this   LORazepam 0.5 MG tablet Commonly known as: ATIVAN TAKE 1 TABLET BY MOUTH EVERY 8 HOURS AS NEEDED FOR  ANXIETY(DO NOT EXCEED MORE THAN 2 TABS IN 24 HRS)   MAGNESIUM OXIDE 400 PO Take 400 mg by mouth 2 (two) times daily.   metoprolol succinate 25 MG 24 hr tablet Commonly known as: TOPROL-XL Take 0.5 tablets (12.5 mg total) by mouth daily. Take with or immediately following a meal.   nitroGLYCERIN 0.4 MG SL tablet Commonly known as: NITROSTAT PLACE 1 TABLET (0.4 MG TOTAL) UNDER THE TONGUE EVERY 5 (FIVE) MINUTES AS NEEDED FOR CHEST PAIN.   polyethylene glycol 17 g packet Commonly known as: MIRALAX / GLYCOLAX Take 17 g by mouth daily as needed for mild constipation.   potassium chloride 10 MEQ tablet Commonly known as: KLOR-CON TAKE 1 TABLET EVERY DAY   potassium chloride 10 MEQ tablet Commonly known as: KLOR-CON Take 1 tablet by mouth twice daily for 3 days, then resume once daily dosing thereafter   torsemide 20 MG tablet Commonly known as: DEMADEX Take 2 tablets (40 mg total) by mouth daily.   torsemide 20 MG tablet Commonly known as: DEMADEX Take 2 tablets ($RemoveBe'40mg'uAgfwUqjt$ ) by mouth twice daily for 3 days, then resume once daily dosing thereafter   Toujeo Max SoloStar 300 UNIT/ML Solostar Pen Generic drug: insulin glargine (2 Unit Dial) Inject 16 Units into the skin at bedtime.   True Metrix Air Glucose Meter w/Device Kit USE AS DIRECTED   True Metrix Blood Glucose Test test strip Generic drug: glucose blood TEST BLOOD SUGAR THREE TIMES DAILY   valsartan 80 MG tablet Commonly known as: Diovan Take 1 tablet (80 mg total) by mouth daily.         OBJECTIVE:   Vital Signs: BP 122/76 (BP Location: Left Arm, Patient Position: Sitting, Cuff Size: Large)   Pulse 84   Ht $R'5\' 2"'wt$  (1.575 m)   Wt 152 lb (68.9 kg)   SpO2 94%   BMI 27.80 kg/m   Wt Readings from Last 3 Encounters:  08/13/22 152 lb (68.9 kg)  06/30/22 149 lb (67.6 kg)  05/19/22 162 lb 12.8 oz (73.8 kg)     Exam: General: Pt appears well and is in NAD  Lungs: Clear with good BS bilat with no rales, rhonchi, or  wheezes  Heart: RRR with normal S1 and S2 and no gallops; no murmurs; no rub  Extremities: 1+  pretibial edema.   Neuro: MS is good with appropriate affect, pt is alert and Ox3       DM foot exam:08/13/2022 The skin of the feet is intact without sores or ulcerations. The pedal pulses are 1+ on right and 1+ on left. The sensation is intact  to a screening 5.07, 10 gram monofilament at the right great toe        DATA REVIEWED:  Lab Results  Component Value Date   HGBA1C 9.9 (A) 08/13/2022   HGBA1C 8.0 (A) 04/16/2022   HGBA1C 11.3 (H) 12/06/2021     Latest Reference Range & Units 05/19/22 16:05  Sodium 134 - 144 mmol/L 147 (H)  Potassium 3.5 - 5.2 mmol/L 3.5  Chloride 96 - 106 mmol/L 113 (H)  CO2 20 - 29 mmol/L 19 (L)  Glucose 70 - 99 mg/dL 113 (H)  BUN 8 - 27 mg/dL 26  Creatinine 0.57 - 1.00 mg/dL 1.49 (H)  Calcium 8.7 - 10.3 mg/dL 7.2 (L)  BUN/Creatinine Ratio 12 - 28  17  eGFR >59 mL/min/1.73 35 (L)     ASSESSMENT / PLAN / RECOMMENDATIONS:   1) Type 2 Diabetes Mellitus, Poorly controlled  , With CKD III , neuropathy and Macrovascular complications - Most recent A1c of 9.9 %. Goal A1c < 7.5 %.     -Patient with multiple social determinants -Dexcom is cost prohibitive -SGLT2 inhibitors are cost prohibitive -She is currently paying $95 for one type of insulin -Patient with worsening glycemic control, and I suspect she has been rationing insulin intake due to cost.  When I went to refill her NovoLog I did not see it on her medication list -She was provided with [employer] patient assistance papers so we can fill for her insulin - Limited glycemic agents due to CKD and hx of pancreatitis  -I have advised the patient again to use her NovoLog before each meal rather than after the meal, she had a post supper BG reading of over 400 mg/DL, she took 10 units of NovoLog and waited a couple more hours and took another 10 units.  I have asked the patient to separate NovoLog intake by 3-4  hours to prevent insulin stacking and hypoglycemia   MEDICATIONS: Continue Toujeo 16 units daily  Take Novolog 10  units TIDQAC Continue CF: Novolog ( BG - 140/25)   EDUCATION / INSTRUCTIONS: BG monitoring instructions: Patient is instructed to check her blood sugars 3-4 times a day, before meals and bedtime . Call [facility] if: BG persistently < 70  I reviewed the Rule of 15 for the treatment of hypoglycemia in detail with the patient. Literature supplied.  2) Diabetic complications:  Eye: Does not have known diabetic retinopathy.  Neuro/ Feet: Does  have known diabetic peripheral neuropathy. Renal: Patient does have known baseline CKD.        F/U in 6 months    Signed electronically by: Mack Guise, MD  [facility]  [facility] [address] [place], [place] [address] Phone: [phone] FAX: [phone]   CC: Isaac Bliss, Rayford Halsted, MD [place] [address] Phone: [phone]  Fax: [phone]  Return to Endocrinology clinic as below: Future Appointments  Date Time Provider [place]  08/13/2022  1:20 PM Tore Carreker, Melanie Crazier, MD [facility] None  08/21/2022  3:35 PM Swinyer, Lanice Schwab, NP [facility]  08/25/2022  3:30 PM [facility] FLUSH ROOM [facility] None  10/22/2022  2:30 PM [facility] FLUSH ROOM [facility] None  10/22/2022  2:30 PM [facility] PHLEBOTOMIST [facility] None  10/22/2022  2:45 PM Owens Shark, NP [facility] None  01/22/2023  2:45 PM Alric Ran, MD [facility] None

## 2022-08-12 NOTE — Telephone Encounter (Signed)
Pt called for refill of TOUJEO SOLOSTAR 300 UNIT/ML SOPN. Reseached chart and found that it was not prescribed by her PCP here.

## 2022-08-13 ENCOUNTER — Encounter: Payer: Self-pay | Admitting: Internal Medicine

## 2022-08-13 ENCOUNTER — Ambulatory Visit: Payer: Medicare HMO | Admitting: Internal Medicine

## 2022-08-13 ENCOUNTER — Other Ambulatory Visit: Payer: Self-pay | Admitting: *Deleted

## 2022-08-13 VITALS — BP 122/76 | HR 84 | Ht 62.0 in | Wt 152.0 lb

## 2022-08-13 DIAGNOSIS — N1832 Chronic kidney disease, stage 3b: Secondary | ICD-10-CM | POA: Diagnosis not present

## 2022-08-13 DIAGNOSIS — E1122 Type 2 diabetes mellitus with diabetic chronic kidney disease: Secondary | ICD-10-CM | POA: Diagnosis not present

## 2022-08-13 DIAGNOSIS — E1159 Type 2 diabetes mellitus with other circulatory complications: Secondary | ICD-10-CM | POA: Diagnosis not present

## 2022-08-13 DIAGNOSIS — Z794 Long term (current) use of insulin: Secondary | ICD-10-CM

## 2022-08-13 DIAGNOSIS — E1165 Type 2 diabetes mellitus with hyperglycemia: Secondary | ICD-10-CM | POA: Diagnosis not present

## 2022-08-13 DIAGNOSIS — F419 Anxiety disorder, unspecified: Secondary | ICD-10-CM

## 2022-08-13 LAB — POCT GLYCOSYLATED HEMOGLOBIN (HGB A1C): Hemoglobin A1C: 9.9 % — AB (ref 4.0–5.6)

## 2022-08-13 LAB — POCT GLUCOSE (DEVICE FOR HOME USE): Glucose Fasting, POC: 163 mg/dL — AB (ref 70–99)

## 2022-08-13 MED ORDER — TOUJEO MAX SOLOSTAR 300 UNIT/ML ~~LOC~~ SOPN: 16.0000 [IU] | PEN_INJECTOR | Freq: Every day | SUBCUTANEOUS | 3 refills | Status: DC

## 2022-08-13 MED ORDER — INSULIN PEN NEEDLE 32G X 4 MM MISC: 1.0000 | Freq: Four times a day (QID) | 3 refills | Status: DC

## 2022-08-13 MED ORDER — NOVOLOG FLEXPEN 100 UNIT/ML ~~LOC~~ SOPN
PEN_INJECTOR | SUBCUTANEOUS | 3 refills | Status: DC
Start: 2022-08-13 — End: 2022-09-05

## 2022-08-13 MED ORDER — NOVOLOG FLEXPEN 100 UNIT/ML ~~LOC~~ SOPN: PEN_INJECTOR | SUBCUTANEOUS | 3 refills | Status: DC

## 2022-08-13 NOTE — Progress Notes (Signed)
Cardiology Office Note:    Date:  08/21/2022   ID:  [name], DOB 11/19/1941, MRN [id number]  PCP:  Isaac Bliss, Rayford Halsted, MD   [facility] Providers Cardiologist:  Mertie Moores, MD     Referring MD: Isaac Bliss, Estel*   Chief Complaint: follow-up leg edema  History of Present Illness:    [name] is a pleasant 81 y.o. female with a hx of CAD s/p NSTEMI, CKD, anemia, persistent atrial fibrillation on chronic anticoagulation.   She was admitted in 2018 with non-ST segment elevation MI in the setting of acute combined systolic and diastolic congestive heart failure.  Ejection fraction 40 to 45%.  History of CKD, treated medically in order to avoid cardiac catheterization at that time.  Underwent cardiac catheterization 09/17/2017 which revealed significant multivessel coronary artery disease, including sequential 40% ostial and 50% distal LMCA stenosis, sequential proximal and mid LAD stenosis of 50 to 60%, 40% distal LCx lesion and 80% proximal to mid RCA stenosis.  FFR of LAD hemodynamically significant (0.72).  Surgical consultation with CT surgery felt to be too high risk for CABG.  She subsequently had PCI of RCA with finding of moderate LAD stenosis to be treated medically with possible future intervention if symptomatic.  Continued medical management, found to be quite anemic 11/2020 with history of AML.  Leg swelling had improved on torsemide. Has chronic dyspnea.  Found to be in atrial fibrillation June 2023 and started on Eliquis 2.5 mg twice daily. Also started on Entresto during admission.   She was last seen in our office on 05/19/2022 by Dr. Acie Fredrickson at which time she continued to have significant leg edema.  Admitted to spending much of the day with her legs down.  Was advised to elevate legs and increase torsemide 40 mg twice daily for 3 days. Having itching felt to be 2/2 to [facility] per Pharm D, switched to valsartan 80 mg daily. Was advised to return in  3 months for follow-up.  Today, she is here alone for follow-up. Reports that one of her medications is going to br $500 so she told Altha not to send it. Has completely run out but does not know which medication. Through further questioning and deductive reasoning, I feel it is likely Eliquis or Jardiance.  I emphasized the importance of anticoagulation in the setting of atrial fibrillation due to risk of stroke.  Her CHA2DS2-VASc = 6. We discussed alternatives to Eliquis. She continues to work making eye glasses. She denies chest pain, shortness of breath, lower extremity edema, fatigue, palpitations, melena, hematuria, hemoptysis, diaphoresis, weakness, presyncope, syncope, orthopnea, and PND. Leg edema has improved with leg elevation using lounge doctor and she takes additional torsemide on occasion which helps. She is a poor historian and does not seem to grasp the gravity of her multiple chronic diseases. 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   Past Medical History:  Diagnosis Date   Acute myelogenous leukemia ([place]) 02/2014   Acute pancreatitis    Arthritis    "back" (09/17/2017)   Atrial flutter (HCC)    Cardiomyopathy (HCC)    Chronic kidney disease, stage II (mild)    Chronic lower back pain    Colon polyps 04/29/2010   TUBULAR ADENOMA AND A SERRATED ADENOMA   COPD (chronic obstructive pulmonary disease) ([place])    CORONARY ARTERY DISEASE 12/24/2007   DIABETES MELLITUS, TYPE II  07/13/2007   History of blood transfusion 2015   "related to leukemia"   History of kidney stones 12/24/2007   History of uterine cancer    HYPERLIPIDEMIA 12/24/2007   HYPERTENSION 07/13/2007   NSTEMI (non-ST elevated myocardial infarction) ([place]) 09/17/2017   Uterine cancer [facility])     Past Surgical History:  Procedure Laterality Date   ABDOMINAL HYSTERECTOMY     "still have my ovaries"   BREAST BIOPSY Left  09/22/2019   BREAST LUMPECTOMY Left 10/24/2019   BREAST LUMPECTOMY WITH RADIOACTIVE SEED LOCALIZATION Left 10/24/2019   Procedure: LEFT BREAST LUMPECTOMY WITH RADIOACTIVE SEED LOCALIZATION;  Surgeon: Jovita Kussmaul, MD;  Location: [facility];  Service: General;  Laterality: Left;   CARDIAC CATHETERIZATION  2002   CARDIAC CATHETERIZATION  09/17/2017   CATARACT EXTRACTION W/ INTRAOCULAR LENS  IMPLANT, BILATERAL Bilateral    COLONOSCOPY W/ BIOPSIES AND POLYPECTOMY  2011   CORONARY STENT INTERVENTION N/A 09/21/2017   Procedure: CORONARY STENT INTERVENTION;  Surgeon: Troy Sine, MD;  Location: [place] [facility];  Service: Cardiovascular;  Laterality: N/A;   DILATION AND CURETTAGE OF UTERUS     ESOPHAGOGASTRODUODENOSCOPY N/A 01/30/2022   Procedure: ESOPHAGOGASTRODUODENOSCOPY (EGD);  Surgeon: Milus Banister, MD;  Location: [facility];  Service: Endoscopy;  Laterality: N/A;   EUS N/A 01/30/2022   Procedure: UPPER ENDOSCOPIC ULTRASOUND (EUS) RADIAL;  Surgeon: Milus Banister, MD;  Location: [facility];  Service: Endoscopy;  Laterality: N/A;   EXCISIONAL HEMORRHOIDECTOMY  X 2   FINE NEEDLE ASPIRATION N/A 01/30/2022   Procedure: FINE NEEDLE ASPIRATION (FNA) LINEAR;  Surgeon: Milus Banister, MD;  Location: [facility];  Service: Endoscopy;  Laterality: N/A;   GANGLION CYST EXCISION Left X 3   INTRAVASCULAR PRESSURE WIRE/FFR STUDY N/A 09/17/2017   Procedure: INTRAVASCULAR PRESSURE WIRE/FFR STUDY;  Surgeon: Nelva Bush, MD;  Location: [place] [facility];  Service: Cardiovascular;  Laterality: N/A;   NASAL SINUS SURGERY     PORTA CATH INSERTION Right 2013   RIGHT/LEFT HEART CATH AND CORONARY ANGIOGRAPHY N/A 09/17/2017   Procedure: RIGHT/LEFT HEART CATH AND CORONARY ANGIOGRAPHY;  Surgeon: Nelva Bush, MD;  Location: [place] [facility];  Service: Cardiovascular;  Laterality: N/A;   TUBAL LIGATION      Current Medications: Current Meds  Medication Sig   apixaban  (ELIQUIS) 2.5 MG TABS tablet Take 1 tablet (2.5 mg total) by mouth 2 (two) times daily.   atorvastatin (LIPITOR) 40 MG tablet TAKE 1 TABLET EVERY DAY   Blood Glucose Monitoring Suppl (TRUE METRIX AIR GLUCOSE METER) w/Device KIT USE AS DIRECTED   clopidogrel (PLAVIX) 75 MG tablet TAKE 1 TABLET EVERY EVENING   cyanocobalamin (,VITAMIN B-12,) 1000 MCG/ML injection INJECT 1ML IN THE MUSCLE EVERY 30 DAYS AS DIRECTED   empagliflozin (JARDIANCE) 25 MG TABS tablet Take 1 tablet (25 mg total) by mouth daily.   fluticasone furoate-vilanterol (BREO ELLIPTA) 200-25 MCG/ACT AEPB Inhale 1 puff into the lungs as needed (wheezing SOB).   HYDROcodone-acetaminophen (NORCO) 5-325 MG tablet Take 1 tablet by mouth every 6 (six) hours as needed for moderate pain.   insulin aspart (NOVOLOG FLEXPEN) 100 UNIT/ML FlexPen Max daily 45 units   insulin glargine, 2 Unit Dial, (TOUJEO MAX SOLOSTAR) 300 UNIT/ML Solostar Pen Inject 16 Units into the skin at bedtime.   Insulin Pen Needle 32G X 4 MM MISC 1 Device by Does not apply route in the morning, at noon, in the evening, and at bedtime.   isosorbide mononitrate (IMDUR) 30  MG 24 hr tablet Take 1 tablet (30 mg total) by mouth daily. TAKE 1 AND 1/2 TABLETS EVERY DAY   letrozole (FEMARA) 2.5 MG tablet Take 1 tablet (2.5 mg total) by mouth daily.   levETIRAcetam (KEPPRA) 500 MG tablet Take 1 tablet (500 mg total) by mouth 2 (two) times daily.   lidocaine 4 % Place 1 patch onto the skin daily.   lipase/protease/amylase (CREON) 36000 UNITS CPEP capsule Take 2 capsules (72,000 Units total) by mouth 3 (three) times daily with meals. (Patient taking differently: Take 72,000 Units by mouth as needed.)   LORazepam (ATIVAN) 0.5 MG tablet TAKE 1 TABLET BY MOUTH EVERY 8 HOURS AS NEEDED FOR ANXIETY(DO NOT EXCEED MORE THAN 2 TABS IN 24 HRS)   MAGNESIUM OXIDE 400 PO Take 400 mg by mouth 2 (two) times daily.   metoprolol succinate (TOPROL-XL) 25 MG 24 hr tablet Take 0.5 tablets (12.5 mg total)  by mouth daily. Take with or immediately following a meal.   nitroGLYCERIN (NITROSTAT) 0.4 MG SL tablet PLACE 1 TABLET (0.4 MG TOTAL) UNDER THE TONGUE EVERY 5 (FIVE) MINUTES AS NEEDED FOR CHEST PAIN.   polyethylene glycol (MIRALAX / GLYCOLAX) 17 g packet Take 17 g by mouth daily as needed for mild constipation.   potassium chloride (KLOR-CON) 10 MEQ tablet TAKE 1 TABLET EVERY DAY   potassium chloride (KLOR-CON) 10 MEQ tablet Take 1 tablet by mouth twice daily for 3 days, then resume once daily dosing thereafter   SYRINGE-NEEDLE, DISP, 3 ML (BD SAFETYGLIDE SYRINGE/NEEDLE) 25G X 1" 3 ML MISC Use for B12 injections   torsemide (DEMADEX) 20 MG tablet Take 2 tablets (40 mg total) by mouth daily.   torsemide (DEMADEX) 20 MG tablet Take 2 tablets (67m) by mouth twice daily for 3 days, then resume once daily dosing thereafter   TRUE METRIX BLOOD GLUCOSE TEST test strip TEST BLOOD SUGAR THREE TIMES DAILY   valsartan (DIOVAN) 80 MG tablet Take 1 tablet (80 mg total) by mouth daily.     Allergies:   Patient has no known allergies.   Social History   Socioeconomic History   Marital status: Married    Spouse name: WCeara Wrightson  Number of children: 1   Years of education: Not on file   Highest education level: 9th grade  Occupational History   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  Tobacco Use   Smoking status: Former    Packs/day: 1.00    Years: 20.00    Total pack years: 20.00    Types: Cigarettes    Quit date: 12/08/2000    Years since quitting: 21.7   Smokeless tobacco: Never  Vaping Use   Vaping Use: Never used  Substance and Sexual Activity   Alcohol use: No    Alcohol/week: 0.0 standard drinks of alcohol   Drug use: No   Sexual activity: Never  Other Topics Concern   Not on file  Social History Narrative   Still works full-time assembling eye glasses _0    Social Determinants of Health   Financial Resource Strain: Low Risk  (05/12/2022)   Overall  Financial Resource Strain (CARDIA)    Difficulty of Paying Living Expenses: Not hard at all  Food Insecurity: No Food Insecurity (05/12/2022)   Hunger Vital Sign    Worried About Running Out of Food in the Last Year: Never true    R[place]in the Last Year: Never true  Transportation  Needs: No Transportation Needs (05/12/2022)   PRAPARE - Hydrologist (Medical): No    Lack of Transportation (Non-Medical): No  Physical Activity: Not on file  Stress: No Stress Concern Present (05/12/2022)   [place]    Feeling of Stress : Not at all  Social Connections: Not on file     Family History: The patient's family history includes Breast cancer (age of onset: 63) in her sister; COPD in her father; Cancer in her maternal uncle, niece, paternal aunt, and paternal uncle; Cervical cancer in her sister; Cirrhosis in her maternal uncle; Congestive Heart Failure in her mother; Esophageal cancer in her brother and father; Heart attack in her maternal grandfather, maternal grandmother, and paternal grandfather; Leukemia in her cousin; Lung cancer in her sister, sister, and sister; Skin cancer in her daughter and maternal grandfather; Stroke (age of onset: 53) in her paternal grandmother; Suicidality in her brother. There is no history of Colon cancer.  ROS:   Please see the history of present illness.   All other systems reviewed and are negative.  Labs/Other Studies Reviewed:    The following studies were reviewed today:  Echo 04/17/22  1. Left ventricular ejection fraction, by estimation, is 60 to 65%. The  left ventricle has normal function. The left ventricle has no regional  wall motion abnormalities. There is mild left ventricular hypertrophy.  Left ventricular diastolic function  could not be evaluated.   2. Right ventricular systolic function is normal. The right ventricular  size is normal. There  is normal pulmonary artery systolic pressure. The  estimated right ventricular systolic pressure is 95.6 mmHg.   3. A small pericardial effusion is present. The pericardial effusion is  circumferential. There is no evidence of cardiac tamponade.   4. The mitral valve is abnormal. Trivial mitral valve regurgitation.   5. The aortic valve was not well visualized. There is mild calcification  of the aortic valve. Aortic valve regurgitation is not visualized. Mild to  moderate aortic valve stenosis. Aortic valve area, by VTI measures 1.28  cm. Aortic valve mean gradient  measures 17.0 mmHg. Aortic valve Vmax measures 2.65 m/s. DI is 0.41.   6. The inferior vena cava is normal in size with <50% respiratory  variability, suggesting right atrial pressure of 8 mmHg.   Comparison(s): Changes from prior study are noted. 05/08/2020: LVEF 55-60%,  small pericardial effusion.   Coronary Stent Intervention 09/21/17  A STENT SIERRA 2.50 X 23 MM drug eluting stent was successfully placed. Prox RCA to Mid RCA lesion, 90 %stenosed. Post intervention, there is a 0% residual stenosis.   Difficult but successful PCI to the mid RCA with ultimate insertion of a 2.523 mm Xience Sierra DES stent postdilated to 2.8 mm with the 90% stenosis being reduced to 0%.   RECOMMENDATION: The patient should continue with DAPT, probably indefinitely with her concomitant CAD since she was turned down for CABG revascularization.  Plan to hydrate the patient.  Increased medical therapy.  Consider staged PCI to the LAD since FFR was positive later this week or possibly in several weeks to allow for stabilization of renal function in this patient with renal insufficiency who already has undergone cath on 10/12 and PCI today vs medical therapy unless increase in symptoms.   R/LHC 09/17/17  Conclusions: Significant multivessel coronary artery disease, including sequential 40% ostial and 50% distal LMCA stenoses, sequential  proximal and mid LAD stenoses of  50-60%, 40% distal LCx lesion, and 80% proximal to mid RCA stenosis. FFR of the LAD is hemodynamically significant (FFR 0.72). Mildly elevated left and right heart filling pressures. Normal Fick cardiac output   Recommendations: Admit for medical optimization and cardiac surgery consultation for bypass, given the patient's multivessel CAD, diabetes mellitus, and reduced LVEF. Gentle hydration tonight. Restart diuresis tomorrow based on renal function. Aggressive secondary prevention.    Recent Labs: 01/16/2022: TSH 2.570 04/16/2022: ALT 40; Magnesium 1.9 05/08/2022: B Natriuretic Peptide 447.5 05/19/2022: BUN 26; Creatinine, Ser 1.49; Potassium 3.5; Sodium 147 06/30/2022: Hemoglobin 10.4; Platelet Count 117  Recent Lipid Panel    Component Value Date/Time   CHOL 112 11/19/2020 0827   TRIG 94 11/19/2020 0827   HDL 49 11/19/2020 0827   CHOLHDL 2.3 11/19/2020 0827   CHOLHDL 4.5 11/22/2017 0354   VLDL 44 (H) 11/22/2017 0354   LDLCALC 45 11/19/2020 0827     Risk Assessment/Calculations:    CHA2DS2-VASc Score = 6  This indicates a 9.7% annual risk of stroke. The patient's score is based upon: CHF History: 1 HTN History: 1 Diabetes History: 1 Stroke History: 0 Vascular Disease History: 0 Age Score: 2 Gender Score: 1    Physical Exam:    VS:  BP 136/78   Pulse 66   Ht _0  (1.575 m)   Wt 150 lb 12.8 oz (68.4 kg)   SpO2 96%   BMI 27.58 kg/m     Wt Readings from Last 3 Encounters:  08/21/22 150 lb 12.8 oz (68.4 kg)  08/13/22 152 lb (68.9 kg)  06/30/22 149 lb (67.6 kg)     GEN:  Well nourished, well developed in no acute distress HEENT: Normal NECK: No JVD; No carotid bruits CARDIAC: Irregular RR, soft systolic murmur. No rubs, gallops RESPIRATORY:  Clear to auscultation without rales, wheezing or rhonchi  ABDOMEN: Soft, non-tender, non-distended MUSCULOSKELETAL:  No edema; No deformity. 2+ pedal pulses, equal bilaterally SKIN: Warm  and dry NEUROLOGIC:  Alert and oriented x 3 PSYCHIATRIC:  Normal affect   EKG:  EKG is not ordered today.    Diagnoses:    1. Leg swelling   2. Chronic combined systolic and diastolic heart failure ([place])   3. Essential hypertension   4. Paroxysmal atrial fibrillation (HCC)   5. Secondary hypercoagulable state ([place])   6. Coronary artery disease involving native coronary artery of native heart without angina pectoris   7. Nonrheumatic aortic valve stenosis    Assessment and Plan:     CAD without angina: Significant multivessel CAD on cath 09/2017. Felt too high risk for CABG. S/p DES to proximal to mid RCA lesion which was 90% stenosed, consideration for staged PCI to LAD at that time, has continued medical therapy. She denies chest pain, dyspnea, or other symptoms concerning for angina.  No indication for further ischemic evaluation at this time.  Not on aspirin, due to [facility]. Continue Plavix, atorvastatin, Jardiance, isosorbide, metoprolol, valsartan. Will recheck lipids at next office visit.   PAF: Has likely run out of Eliquis due to cost. I emphasized the high risk of stroke off oral anticoagulation. Provided options for her to consider including Coumadin, Xarelto, and Pradaxa.  Asked her to call her insurance company for preferred medication. Advised that coumadin therapy requires careful monitoring with routine in-office INR checks. One box of Eliquis samples provided and I have asked her to call tomorrow to determine further management.  She denies worsening symptoms of A-fib.  Chronic combined CHF:  LVEF 60 to 65%, mild LVH, indeterminate diastolic function. She denies dyspnea, orthopnea, PND. Has chronic leg edema which she reports has improved with leg elevation and increased diuretics. Does not appear volume overloaded on exam today. Need to assess if she has also stopped Jardiance due to cost.   Aortic stenosis: Mild to moderate aortic valve stenosis with mean gradient 17 mmHg on  echo 04/2022.  Soft murmur on exam. She denies dyspnea, chest pain, presyncope, syncope, palpitations. We will continue to monitor clinically for now with repeat echo in 1 year.   Leg edema: Continues to have intermittent leg edema which has improved with leg elevation. She increases diuretic for 3 days on occasion and symptoms improve. No significant swelling today.   Hypertension: BP is well-controlled.  She does not monitor on a consistent basis.  No medication changes today.   Disposition: 3 months with Dr. Acie Fredrickson or APP  Medication Adjustments/Labs and Tests Ordered: Current medicines are reviewed at length with the patient today.  Concerns regarding medicines are outlined above.  No orders of the defined types were placed in this encounter.  No orders of the defined types were placed in this encounter.   Patient Instructions  Medication Instructions:   Your physician recommends that you continue on your current medications as directed. Please refer to the Current Medication list given to you today.   *If you need a refill on your cardiac medications before your next appointment, please call your pharmacy*   Lab Work:  None ordered.  If you have labs (blood work) drawn today and your tests are completely normal, you will receive your results only by: [place] (if you have MyChart) OR A paper copy in the mail If you have any lab test that is abnormal or we need to change your treatment, we will call you to review the results.   Testing/Procedures:  None ordered.   Follow-Up: At [facility] [place], you and your health needs are our priority.  As part of our continuing mission to provide you with exceptional heart care, we have created designated Provider Care Teams.  These Care Teams include your primary Cardiologist (physician) and Advanced Practice Providers (APPs -  Physician Assistants and Nurse Practitioners) who all work together to provide you with the care  you need, when you need it.  We recommend signing up for the patient portal called "MyChart".  Sign up information is provided on this After Visit Summary.  MyChart is used to connect with patients for Virtual Visits (Telemedicine).  Patients are able to view lab/test results, encounter notes, upcoming appointments, etc.  Non-urgent messages can be sent to your provider as well.   To learn more about what you can do with MyChart, go to [email or web address].    Your next appointment:   3 month(s)  The format for your next appointment:   In Person  Provider:   Christen Bame, NP  Other Instructions  For a stroke:  Coumadin is the cheapest medication/Requires office visit.  Check with insurance about other options:  Xarelto Eliquis  Pradaxa   Call us at [phone] talk with triage nurse tomorrow and let them know your decision.   Important Information About Sugar         Signed, Emmaline Life, NP  08/21/2022 5:00 PM    [place]

## 2022-08-13 NOTE — Patient Instructions (Addendum)
-   Continue Toujeo  16  units daily  - Continue  Novolog 10 units with each meal  - Novolog correctional insulin: ADD extra units on insulin to your meal-time Novolog dose if your blood sugars are higher than 165. Use the scale below to help guide you:   Blood sugar before meal Number of units to inject  Less than 165 0 unit  166 -  200 1 units  201 -  235 2 units  236 -  270 3 units  271 -  305 4 units  306 -  340 5 units  341 -  375 6 units  376 -  410 7 units  411 -  445 8 units         HOW TO TREAT LOW BLOOD SUGARS (Blood sugar LESS THAN 70 MG/DL) Please follow the RULE OF 15 for the treatment of hypoglycemia treatment (when your (blood sugars are less than 70 mg/dL)   STEP 1: Take 15 grams of carbohydrates when your blood sugar is low, which includes:  3-4 GLUCOSE TABS  OR 3-4 OZ OF JUICE OR REGULAR SODA OR ONE TUBE OF GLUCOSE GEL    STEP 2: RECHECK blood sugar in 15 MINUTES STEP 3: If your blood sugar is still low at the 15 minute recheck --> then, go back to STEP 1 and treat AGAIN with another 15 grams of carbohydrates.

## 2022-08-14 ENCOUNTER — Other Ambulatory Visit: Payer: Self-pay | Admitting: *Deleted

## 2022-08-14 DIAGNOSIS — F419 Anxiety disorder, unspecified: Secondary | ICD-10-CM

## 2022-08-21 ENCOUNTER — Encounter: Payer: Self-pay | Admitting: Nurse Practitioner

## 2022-08-21 ENCOUNTER — Other Ambulatory Visit: Payer: Self-pay | Admitting: Internal Medicine

## 2022-08-21 ENCOUNTER — Ambulatory Visit: Payer: Medicare HMO | Attending: Nurse Practitioner | Admitting: Nurse Practitioner

## 2022-08-21 VITALS — BP 136/78 | HR 66 | Ht 62.0 in | Wt 150.8 lb

## 2022-08-21 DIAGNOSIS — I2510 Atherosclerotic heart disease of native coronary artery without angina pectoris: Secondary | ICD-10-CM | POA: Diagnosis not present

## 2022-08-21 DIAGNOSIS — I480 Paroxysmal atrial fibrillation: Secondary | ICD-10-CM

## 2022-08-21 DIAGNOSIS — M7989 Other specified soft tissue disorders: Secondary | ICD-10-CM | POA: Diagnosis not present

## 2022-08-21 DIAGNOSIS — I5042 Chronic combined systolic (congestive) and diastolic (congestive) heart failure: Secondary | ICD-10-CM

## 2022-08-21 DIAGNOSIS — I350 Nonrheumatic aortic (valve) stenosis: Secondary | ICD-10-CM | POA: Diagnosis not present

## 2022-08-21 DIAGNOSIS — I10 Essential (primary) hypertension: Secondary | ICD-10-CM

## 2022-08-21 DIAGNOSIS — D6869 Other thrombophilia: Secondary | ICD-10-CM

## 2022-08-21 NOTE — Patient Instructions (Addendum)
Medication Instructions:   Your physician recommends that you continue on your current medications as directed. Please refer to the Current Medication list given to you today.   *If you need a refill on your cardiac medications before your next appointment, please call your pharmacy*   Lab Work:  None ordered.  If you have labs (blood work) drawn today and your tests are completely normal, you will receive your results only by: [place] (if you have MyChart) OR A paper copy in the mail If you have any lab test that is abnormal or we need to change your treatment, we will call you to review the results.   Testing/Procedures:  None ordered.   Follow-Up: At [facility], you and your health needs are our priority.  As part of our continuing mission to provide you with exceptional heart care, we have created designated Provider Care Teams.  These Care Teams include your primary Cardiologist (physician) and Advanced Practice Providers (APPs -  Physician Assistants and Nurse Practitioners) who all work together to provide you with the care you need, when you need it.  We recommend signing up for the patient portal called "MyChart".  Sign up information is provided on this After Visit Summary.  MyChart is used to connect with patients for Virtual Visits (Telemedicine).  Patients are able to view lab/test results, encounter notes, upcoming appointments, etc.  Non-urgent messages can be sent to your provider as well.   To learn more about what you can do with MyChart, go to [email or web address].    Your next appointment:   3 month(s)  The format for your next appointment:   In Person  Provider:   Christen Bame, NP  Other Instructions  For a stroke:  Coumadin is the cheapest medication/Requires office visit.  Check with insurance about other options:  Xarelto Eliquis  Pradaxa   Call us at [phone] talk with triage nurse tomorrow and let them know your  decision.   Important Information About Sugar

## 2022-08-25 ENCOUNTER — Telehealth: Payer: Self-pay | Admitting: Internal Medicine

## 2022-08-25 ENCOUNTER — Inpatient Hospital Stay: Payer: Medicare HMO | Attending: Oncology

## 2022-08-25 ENCOUNTER — Telehealth: Payer: Self-pay | Admitting: Nurse Practitioner

## 2022-08-25 VITALS — BP 141/53 | HR 68 | Temp 98.1°F | Resp 18 | Ht 62.0 in | Wt 155.4 lb

## 2022-08-25 DIAGNOSIS — Z95828 Presence of other vascular implants and grafts: Secondary | ICD-10-CM

## 2022-08-25 DIAGNOSIS — C9201 Acute myeloblastic leukemia, in remission: Secondary | ICD-10-CM | POA: Insufficient documentation

## 2022-08-25 DIAGNOSIS — Z452 Encounter for adjustment and management of vascular access device: Secondary | ICD-10-CM | POA: Diagnosis not present

## 2022-08-25 DIAGNOSIS — M25559 Pain in unspecified hip: Secondary | ICD-10-CM

## 2022-08-25 MED ORDER — HEPARIN SOD (PORK) LOCK FLUSH 100 UNIT/ML IV SOLN
500.0000 [IU] | Freq: Once | INTRAVENOUS | Status: AC
  Administered 2022-08-25: 500 [IU] via INTRAVENOUS

## 2022-08-25 MED ORDER — HEPARIN SOD (PORK) LOCK FLUSH 100 UNIT/ML IV SOLN
500.0000 [IU] | Freq: Once | INTRAVENOUS | Status: AC | PRN
  Administered 2022-08-25: 500 [IU] via INTRAVENOUS

## 2022-08-25 MED ORDER — SODIUM CHLORIDE 0.9% FLUSH
10.0000 mL | Freq: Once | INTRAVENOUS | Status: AC
  Administered 2022-08-25: 10 mL via INTRAVENOUS

## 2022-08-25 NOTE — Telephone Encounter (Signed)
Pt requesting provider order a bath stool for her. She asks that it is a stool, with no back and for someone to call her to confirm it has been ordered

## 2022-08-25 NOTE — Patient Instructions (Signed)

## 2022-08-25 NOTE — Telephone Encounter (Signed)
Leaving 2 weeks of Eliquis 2.5 mg tablets samples and a pt assistance application at the front desk for pt to pick up.  Exp: 03/2023    Lot#   [id number]   thanks Lynn,LPN

## 2022-08-25 NOTE — Telephone Encounter (Signed)
Last OV 04/16/22

## 2022-08-25 NOTE — Telephone Encounter (Signed)
**Note De-Identified  Obfuscation** The pt states that she will call [employer] to ask if she is eligible for approval in their Eliquis asst program prior to filling out the application that we are leaving for her to pick up and will let us know if she is not eligible and cannot afford her Eliquis.  She is aware that we are leaving her 2 weeks of Eliquis 2.5 mg samples and a [employer] application for Eliquis asst in the front office at Dr Elmarie Shiley office at [facility] [place], [address] for her to pick up.  She thanked me for calling her back to discuss [employer].

## 2022-08-25 NOTE — Telephone Encounter (Signed)
Pt c/o medication issue:  1. Name of Medication: Aspirin- Coumadin  2. How are you currently taking this medication (dosage and times per day)?   3. Are you having a reaction (difficulty breathing--STAT)?   4. What is your medication issue? She wants to know if Aspirin is just as good as taking Coumadin?

## 2022-08-25 NOTE — Telephone Encounter (Signed)
Called patient back about her message. Patient stated that she does not want to go on Coumandin and wondered if she could go on just Aspirin. Informed patient that these two drugs do not do the same thing. Patient stated she cannot afford Eliquis. Patient given patient assistance information for Eliquis. Informed her that a message would be sent to our preauth nurse to see if she can help in any way. Patient verbalized understanding.

## 2022-08-26 NOTE — Addendum Note (Signed)
Addended by: Darlina Rumpf A on: 08/26/2022 11:21 AM   Modules accepted: Orders

## 2022-08-26 NOTE — Telephone Encounter (Signed)
Order has been placed for a bath stool with no back.

## 2022-08-29 ENCOUNTER — Emergency Department (HOSPITAL_COMMUNITY): Payer: Worker's Compensation

## 2022-08-29 ENCOUNTER — Encounter (HOSPITAL_COMMUNITY): Payer: Self-pay

## 2022-08-29 ENCOUNTER — Other Ambulatory Visit: Payer: Self-pay

## 2022-08-29 ENCOUNTER — Inpatient Hospital Stay (HOSPITAL_COMMUNITY)
Admission: EM | Admit: 2022-08-29 | Discharge: 2022-09-05 | DRG: 481 | Disposition: A | Discharge status: Rehabilitation Facility | Payer: Worker's Compensation | Attending: Internal Medicine | Admitting: Internal Medicine

## 2022-08-29 DIAGNOSIS — K5900 Constipation, unspecified: Secondary | ICD-10-CM | POA: Diagnosis not present

## 2022-08-29 DIAGNOSIS — C9201 Acute myeloblastic leukemia, in remission: Secondary | ICD-10-CM | POA: Diagnosis present

## 2022-08-29 DIAGNOSIS — Z825 Family history of asthma and other chronic lower respiratory diseases: Secondary | ICD-10-CM

## 2022-08-29 DIAGNOSIS — Z961 Presence of intraocular lens: Secondary | ICD-10-CM | POA: Diagnosis present

## 2022-08-29 DIAGNOSIS — W010XXA Fall on same level from slipping, tripping and stumbling without subsequent striking against object, initial encounter: Secondary | ICD-10-CM | POA: Diagnosis present

## 2022-08-29 DIAGNOSIS — I739 Peripheral vascular disease, unspecified: Secondary | ICD-10-CM | POA: Diagnosis not present

## 2022-08-29 DIAGNOSIS — I4819 Other persistent atrial fibrillation: Secondary | ICD-10-CM | POA: Diagnosis present

## 2022-08-29 DIAGNOSIS — R569 Unspecified convulsions: Secondary | ICD-10-CM | POA: Diagnosis not present

## 2022-08-29 DIAGNOSIS — I10 Essential (primary) hypertension: Secondary | ICD-10-CM | POA: Diagnosis present

## 2022-08-29 DIAGNOSIS — Z01818 Encounter for other preprocedural examination: Secondary | ICD-10-CM | POA: Diagnosis not present

## 2022-08-29 DIAGNOSIS — K7460 Unspecified cirrhosis of liver: Secondary | ICD-10-CM | POA: Diagnosis present

## 2022-08-29 DIAGNOSIS — S72001A Fracture of unspecified part of neck of right femur, initial encounter for closed fracture: Secondary | ICD-10-CM

## 2022-08-29 DIAGNOSIS — E1165 Type 2 diabetes mellitus with hyperglycemia: Secondary | ICD-10-CM | POA: Diagnosis present

## 2022-08-29 DIAGNOSIS — Y990 Civilian activity done for income or pay: Secondary | ICD-10-CM

## 2022-08-29 DIAGNOSIS — E1169 Type 2 diabetes mellitus with other specified complication: Secondary | ICD-10-CM | POA: Diagnosis not present

## 2022-08-29 DIAGNOSIS — D631 Anemia in chronic kidney disease: Secondary | ICD-10-CM | POA: Diagnosis present

## 2022-08-29 DIAGNOSIS — I2510 Atherosclerotic heart disease of native coronary artery without angina pectoris: Secondary | ICD-10-CM | POA: Diagnosis not present

## 2022-08-29 DIAGNOSIS — Z9842 Cataract extraction status, left eye: Secondary | ICD-10-CM

## 2022-08-29 DIAGNOSIS — S72009A Fracture of unspecified part of neck of unspecified femur, initial encounter for closed fracture: Secondary | ICD-10-CM | POA: Diagnosis not present

## 2022-08-29 DIAGNOSIS — Z95828 Presence of other vascular implants and grafts: Secondary | ICD-10-CM | POA: Diagnosis not present

## 2022-08-29 DIAGNOSIS — I130 Hypertensive heart and chronic kidney disease with heart failure and stage 1 through stage 4 chronic kidney disease, or unspecified chronic kidney disease: Secondary | ICD-10-CM | POA: Diagnosis present

## 2022-08-29 DIAGNOSIS — Z8049 Family history of malignant neoplasm of other genital organs: Secondary | ICD-10-CM

## 2022-08-29 DIAGNOSIS — S72101A Unspecified trochanteric fracture of right femur, initial encounter for closed fracture: Secondary | ICD-10-CM | POA: Diagnosis not present

## 2022-08-29 DIAGNOSIS — N184 Chronic kidney disease, stage 4 (severe): Secondary | ICD-10-CM | POA: Diagnosis not present

## 2022-08-29 DIAGNOSIS — Z803 Family history of malignant neoplasm of breast: Secondary | ICD-10-CM

## 2022-08-29 DIAGNOSIS — G40909 Epilepsy, unspecified, not intractable, without status epilepticus: Secondary | ICD-10-CM | POA: Diagnosis present

## 2022-08-29 DIAGNOSIS — E569 Vitamin deficiency, unspecified: Secondary | ICD-10-CM | POA: Diagnosis not present

## 2022-08-29 DIAGNOSIS — I252 Old myocardial infarction: Secondary | ICD-10-CM

## 2022-08-29 DIAGNOSIS — R600 Localized edema: Secondary | ICD-10-CM | POA: Diagnosis not present

## 2022-08-29 DIAGNOSIS — M1990 Unspecified osteoarthritis, unspecified site: Secondary | ICD-10-CM | POA: Diagnosis not present

## 2022-08-29 DIAGNOSIS — Z7902 Long term (current) use of antithrombotics/antiplatelets: Secondary | ICD-10-CM

## 2022-08-29 DIAGNOSIS — G8929 Other chronic pain: Secondary | ICD-10-CM | POA: Diagnosis present

## 2022-08-29 DIAGNOSIS — E559 Vitamin D deficiency, unspecified: Secondary | ICD-10-CM | POA: Diagnosis present

## 2022-08-29 DIAGNOSIS — Z87891 Personal history of nicotine dependence: Secondary | ICD-10-CM

## 2022-08-29 DIAGNOSIS — Z794 Long term (current) use of insulin: Secondary | ICD-10-CM | POA: Diagnosis not present

## 2022-08-29 DIAGNOSIS — N183 Chronic kidney disease, stage 3 unspecified: Secondary | ICD-10-CM | POA: Diagnosis not present

## 2022-08-29 DIAGNOSIS — I480 Paroxysmal atrial fibrillation: Secondary | ICD-10-CM | POA: Diagnosis not present

## 2022-08-29 DIAGNOSIS — Z823 Family history of stroke: Secondary | ICD-10-CM

## 2022-08-29 DIAGNOSIS — M19041 Primary osteoarthritis, right hand: Secondary | ICD-10-CM | POA: Diagnosis not present

## 2022-08-29 DIAGNOSIS — N390 Urinary tract infection, site not specified: Secondary | ICD-10-CM | POA: Diagnosis not present

## 2022-08-29 DIAGNOSIS — I4891 Unspecified atrial fibrillation: Secondary | ICD-10-CM | POA: Diagnosis not present

## 2022-08-29 DIAGNOSIS — W19XXXA Unspecified fall, initial encounter: Secondary | ICD-10-CM | POA: Diagnosis not present

## 2022-08-29 DIAGNOSIS — Z170 Estrogen receptor positive status [ER+]: Secondary | ICD-10-CM

## 2022-08-29 DIAGNOSIS — K862 Cyst of pancreas: Secondary | ICD-10-CM | POA: Diagnosis not present

## 2022-08-29 DIAGNOSIS — N19 Unspecified kidney failure: Secondary | ICD-10-CM | POA: Diagnosis not present

## 2022-08-29 DIAGNOSIS — Z808 Family history of malignant neoplasm of other organs or systems: Secondary | ICD-10-CM

## 2022-08-29 DIAGNOSIS — S72141D Displaced intertrochanteric fracture of right femur, subsequent encounter for closed fracture with routine healing: Secondary | ICD-10-CM | POA: Diagnosis not present

## 2022-08-29 DIAGNOSIS — M1611 Unilateral primary osteoarthritis, right hip: Secondary | ICD-10-CM | POA: Diagnosis not present

## 2022-08-29 DIAGNOSIS — E11649 Type 2 diabetes mellitus with hypoglycemia without coma: Secondary | ICD-10-CM | POA: Diagnosis present

## 2022-08-29 DIAGNOSIS — R338 Other retention of urine: Secondary | ICD-10-CM

## 2022-08-29 DIAGNOSIS — Z7901 Long term (current) use of anticoagulants: Secondary | ICD-10-CM

## 2022-08-29 DIAGNOSIS — M25551 Pain in right hip: Secondary | ICD-10-CM | POA: Diagnosis not present

## 2022-08-29 DIAGNOSIS — C9200 Acute myeloblastic leukemia, not having achieved remission: Secondary | ICD-10-CM | POA: Diagnosis present

## 2022-08-29 DIAGNOSIS — R339 Retention of urine, unspecified: Secondary | ICD-10-CM | POA: Diagnosis present

## 2022-08-29 DIAGNOSIS — C9291 Myeloid leukemia, unspecified in remission: Secondary | ICD-10-CM | POA: Diagnosis not present

## 2022-08-29 DIAGNOSIS — J449 Chronic obstructive pulmonary disease, unspecified: Secondary | ICD-10-CM | POA: Diagnosis not present

## 2022-08-29 DIAGNOSIS — R079 Chest pain, unspecified: Secondary | ICD-10-CM | POA: Diagnosis not present

## 2022-08-29 DIAGNOSIS — D649 Anemia, unspecified: Secondary | ICD-10-CM | POA: Diagnosis not present

## 2022-08-29 DIAGNOSIS — N179 Acute kidney failure, unspecified: Secondary | ICD-10-CM | POA: Diagnosis not present

## 2022-08-29 DIAGNOSIS — C50412 Malignant neoplasm of upper-outer quadrant of left female breast: Secondary | ICD-10-CM

## 2022-08-29 DIAGNOSIS — N189 Chronic kidney disease, unspecified: Secondary | ICD-10-CM | POA: Diagnosis not present

## 2022-08-29 DIAGNOSIS — I5042 Chronic combined systolic (congestive) and diastolic (congestive) heart failure: Secondary | ICD-10-CM

## 2022-08-29 DIAGNOSIS — Z8249 Family history of ischemic heart disease and other diseases of the circulatory system: Secondary | ICD-10-CM

## 2022-08-29 DIAGNOSIS — M7989 Other specified soft tissue disorders: Secondary | ICD-10-CM | POA: Diagnosis not present

## 2022-08-29 DIAGNOSIS — G894 Chronic pain syndrome: Secondary | ICD-10-CM | POA: Diagnosis not present

## 2022-08-29 DIAGNOSIS — Z7984 Long term (current) use of oral hypoglycemic drugs: Secondary | ICD-10-CM

## 2022-08-29 DIAGNOSIS — E8721 Acute metabolic acidosis: Secondary | ICD-10-CM | POA: Diagnosis not present

## 2022-08-29 DIAGNOSIS — E1122 Type 2 diabetes mellitus with diabetic chronic kidney disease: Secondary | ICD-10-CM | POA: Diagnosis not present

## 2022-08-29 DIAGNOSIS — S72141A Displaced intertrochanteric fracture of right femur, initial encounter for closed fracture: Principal | ICD-10-CM | POA: Diagnosis present

## 2022-08-29 DIAGNOSIS — Z8542 Personal history of malignant neoplasm of other parts of uterus: Secondary | ICD-10-CM

## 2022-08-29 DIAGNOSIS — D696 Thrombocytopenia, unspecified: Secondary | ICD-10-CM | POA: Diagnosis present

## 2022-08-29 DIAGNOSIS — G9389 Other specified disorders of brain: Secondary | ICD-10-CM | POA: Diagnosis not present

## 2022-08-29 DIAGNOSIS — Z800 Family history of malignant neoplasm of digestive organs: Secondary | ICD-10-CM

## 2022-08-29 DIAGNOSIS — Z23 Encounter for immunization: Secondary | ICD-10-CM | POA: Diagnosis not present

## 2022-08-29 DIAGNOSIS — I5030 Unspecified diastolic (congestive) heart failure: Secondary | ICD-10-CM | POA: Diagnosis not present

## 2022-08-29 DIAGNOSIS — C9290 Myeloid leukemia, unspecified, not having achieved remission: Secondary | ICD-10-CM | POA: Diagnosis not present

## 2022-08-29 DIAGNOSIS — Z801 Family history of malignant neoplasm of trachea, bronchus and lung: Secondary | ICD-10-CM

## 2022-08-29 DIAGNOSIS — I509 Heart failure, unspecified: Secondary | ICD-10-CM | POA: Diagnosis not present

## 2022-08-29 DIAGNOSIS — Z853 Personal history of malignant neoplasm of breast: Secondary | ICD-10-CM

## 2022-08-29 DIAGNOSIS — Z9841 Cataract extraction status, right eye: Secondary | ICD-10-CM

## 2022-08-29 DIAGNOSIS — Z9889 Other specified postprocedural states: Secondary | ICD-10-CM | POA: Diagnosis not present

## 2022-08-29 DIAGNOSIS — J90 Pleural effusion, not elsewhere classified: Secondary | ICD-10-CM | POA: Diagnosis not present

## 2022-08-29 DIAGNOSIS — Z79899 Other long term (current) drug therapy: Secondary | ICD-10-CM | POA: Diagnosis not present

## 2022-08-29 DIAGNOSIS — S72144S Nondisplaced intertrochanteric fracture of right femur, sequela: Secondary | ICD-10-CM | POA: Diagnosis not present

## 2022-08-29 DIAGNOSIS — E785 Hyperlipidemia, unspecified: Secondary | ICD-10-CM | POA: Diagnosis present

## 2022-08-29 DIAGNOSIS — Z806 Family history of leukemia: Secondary | ICD-10-CM

## 2022-08-29 DIAGNOSIS — Z955 Presence of coronary angioplasty implant and graft: Secondary | ICD-10-CM

## 2022-08-29 DIAGNOSIS — I5032 Chronic diastolic (congestive) heart failure: Secondary | ICD-10-CM | POA: Diagnosis not present

## 2022-08-29 DIAGNOSIS — M549 Dorsalgia, unspecified: Secondary | ICD-10-CM | POA: Diagnosis not present

## 2022-08-29 DIAGNOSIS — S72144A Nondisplaced intertrochanteric fracture of right femur, initial encounter for closed fracture: Secondary | ICD-10-CM | POA: Diagnosis not present

## 2022-08-29 DIAGNOSIS — I429 Cardiomyopathy, unspecified: Secondary | ICD-10-CM | POA: Diagnosis not present

## 2022-08-29 DIAGNOSIS — S0990XA Unspecified injury of head, initial encounter: Secondary | ICD-10-CM | POA: Diagnosis not present

## 2022-08-29 DIAGNOSIS — N281 Cyst of kidney, acquired: Secondary | ICD-10-CM | POA: Diagnosis not present

## 2022-08-29 DIAGNOSIS — D62 Acute posthemorrhagic anemia: Secondary | ICD-10-CM | POA: Diagnosis not present

## 2022-08-29 DIAGNOSIS — B961 Klebsiella pneumoniae [K. pneumoniae] as the cause of diseases classified elsewhere: Secondary | ICD-10-CM | POA: Diagnosis not present

## 2022-08-29 DIAGNOSIS — R7989 Other specified abnormal findings of blood chemistry: Secondary | ICD-10-CM | POA: Diagnosis not present

## 2022-08-29 DIAGNOSIS — N1832 Chronic kidney disease, stage 3b: Secondary | ICD-10-CM | POA: Diagnosis not present

## 2022-08-29 LAB — CBC
HCT: 33.6 % — ABNORMAL LOW (ref 36.0–46.0)
Hemoglobin: 11.1 g/dL — ABNORMAL LOW (ref 12.0–15.0)
MCH: 30.5 pg (ref 26.0–34.0)
MCHC: 33 g/dL (ref 30.0–36.0)
MCV: 92.3 fL (ref 80.0–100.0)
Platelets: 120 10*3/uL — ABNORMAL LOW (ref 150–400)
RBC: 3.64 MIL/uL — ABNORMAL LOW (ref 3.87–5.11)
RDW: 13.1 % (ref 11.5–15.5)
WBC: 9.5 10*3/uL (ref 4.0–10.5)
nRBC: 0 % (ref 0.0–0.2)

## 2022-08-29 LAB — BASIC METABOLIC PANEL
Anion gap: 8 (ref 5–15)
BUN: 49 mg/dL — ABNORMAL HIGH (ref 8–23)
CO2: 16 mmol/L — ABNORMAL LOW (ref 22–32)
Calcium: 7.3 mg/dL — ABNORMAL LOW (ref 8.9–10.3)
Chloride: 116 mmol/L — ABNORMAL HIGH (ref 98–111)
Creatinine, Ser: 2.41 mg/dL — ABNORMAL HIGH (ref 0.44–1.00)
GFR, Estimated: 20 mL/min — ABNORMAL LOW (ref >60)
Glucose, Bld: 242 mg/dL — ABNORMAL HIGH (ref 70–99)
Potassium: 3.5 mmol/L (ref 3.5–5.1)
Sodium: 140 mmol/L (ref 135–145)

## 2022-08-29 LAB — TYPE AND SCREEN
ABO/RH(D): O NEG
Antibody Screen: NEGATIVE

## 2022-08-29 LAB — GLUCOSE, CAPILLARY
Glucose-Capillary: 195 mg/dL — ABNORMAL HIGH (ref 70–99)
Glucose-Capillary: 211 mg/dL — ABNORMAL HIGH (ref 70–99)

## 2022-08-29 MED ORDER — CEFAZOLIN SODIUM-DEXTROSE 2-4 GM/100ML-% IV SOLN
2.0000 g | INTRAVENOUS | Status: AC
  Filled 2022-08-29: qty 100

## 2022-08-29 MED ORDER — HYDROMORPHONE HCL 1 MG/ML IJ SOLN
0.5000 mg | Freq: Once | INTRAMUSCULAR | Status: AC
  Administered 2022-08-29: 0.5 mg via INTRAVENOUS
  Filled 2022-08-29: qty 1

## 2022-08-29 MED ORDER — INSULIN GLARGINE (2 UNIT DIAL) 300 UNIT/ML ~~LOC~~ SOPN: 16.0000 [IU] | PEN_INJECTOR | Freq: Every day | SUBCUTANEOUS | Status: DC

## 2022-08-29 MED ORDER — OXYCODONE HCL 5 MG PO TABS
5.0000 mg | ORAL_TABLET | ORAL | Status: DC | PRN
  Administered 2022-08-29 – 2022-08-30 (×3): 10 mg via ORAL
  Administered 2022-08-31 (×2): 5 mg via ORAL
  Administered 2022-08-31 – 2022-09-02 (×2): 10 mg via ORAL
  Administered 2022-09-02 (×2): 5 mg via ORAL
  Administered 2022-09-03 – 2022-09-05 (×7): 10 mg via ORAL
  Filled 2022-08-29 (×4): qty 2
  Filled 2022-08-29: qty 1
  Filled 2022-08-29: qty 2
  Filled 2022-08-29: qty 1
  Filled 2022-08-29 (×3): qty 2
  Filled 2022-08-29: qty 1
  Filled 2022-08-29 (×5): qty 2
  Filled 2022-08-29: qty 1

## 2022-08-29 MED ORDER — SODIUM CHLORIDE 0.9 % IV BOLUS
1000.0000 mL | Freq: Once | INTRAVENOUS | Status: AC
  Administered 2022-08-29: 1000 mL via INTRAVENOUS

## 2022-08-29 MED ORDER — LORAZEPAM 0.5 MG PO TABS
0.5000 mg | ORAL_TABLET | Freq: Four times a day (QID) | ORAL | Status: DC | PRN
  Administered 2022-08-30 – 2022-09-01 (×3): 0.5 mg via ORAL
  Filled 2022-08-29 (×2): qty 1

## 2022-08-29 MED ORDER — INSULIN GLARGINE-YFGN 100 UNIT/ML ~~LOC~~ SOLN
12.0000 [IU] | Freq: Every day | SUBCUTANEOUS | Status: DC
  Administered 2022-08-29 – 2022-08-30 (×2): 12 [IU] via SUBCUTANEOUS
  Filled 2022-08-29 (×6): qty 0.12

## 2022-08-29 MED ORDER — HYDROMORPHONE HCL 1 MG/ML IJ SOLN
1.0000 mg | INTRAMUSCULAR | Status: DC | PRN
  Administered 2022-08-29 – 2022-09-05 (×19): 1 mg via INTRAVENOUS
  Filled 2022-08-29 (×19): qty 1

## 2022-08-29 MED ORDER — DOCUSATE SODIUM 100 MG PO CAPS
100.0000 mg | ORAL_CAPSULE | Freq: Two times a day (BID) | ORAL | Status: DC
  Administered 2022-08-29: 100 mg via ORAL
  Filled 2022-08-29: qty 1

## 2022-08-29 MED ORDER — INSULIN ASPART 100 UNIT/ML IJ SOLN: 0.0000 [IU] | Freq: Every day | INTRAMUSCULAR | Status: DC

## 2022-08-29 MED ORDER — BISACODYL 5 MG PO TBEC: 5.0000 mg | DELAYED_RELEASE_TABLET | Freq: Every day | ORAL | Status: DC | PRN

## 2022-08-29 MED ORDER — LEVETIRACETAM 500 MG PO TABS
500.0000 mg | ORAL_TABLET | Freq: Two times a day (BID) | ORAL | Status: DC
  Administered 2022-08-29 – 2022-09-05 (×12): 500 mg via ORAL
  Filled 2022-08-29 (×14): qty 1

## 2022-08-29 MED ORDER — INSULIN ASPART 100 UNIT/ML IJ SOLN
0.0000 [IU] | Freq: Three times a day (TID) | INTRAMUSCULAR | Status: DC
  Administered 2022-08-30 – 2022-09-01 (×5): 3 [IU] via SUBCUTANEOUS
  Administered 2022-09-02: 5 [IU] via SUBCUTANEOUS

## 2022-08-29 MED ORDER — METHOCARBAMOL 500 MG PO TABS
500.0000 mg | ORAL_TABLET | Freq: Four times a day (QID) | ORAL | Status: DC | PRN
  Administered 2022-08-29 – 2022-08-30 (×2): 500 mg via ORAL
  Filled 2022-08-29 (×2): qty 1

## 2022-08-29 MED ORDER — ATORVASTATIN CALCIUM 40 MG PO TABS
40.0000 mg | ORAL_TABLET | Freq: Every day | ORAL | Status: DC
  Administered 2022-08-30 – 2022-09-05 (×7): 40 mg via ORAL
  Filled 2022-08-29 (×7): qty 1

## 2022-08-29 MED ORDER — ONDANSETRON HCL 4 MG/2ML IJ SOLN: 4.0000 mg | Freq: Four times a day (QID) | INTRAMUSCULAR | Status: DC | PRN

## 2022-08-29 MED ORDER — ONDANSETRON HCL 4 MG/2ML IJ SOLN
4.0000 mg | Freq: Once | INTRAMUSCULAR | Status: AC
  Administered 2022-08-29: 4 mg via INTRAVENOUS
  Filled 2022-08-29: qty 2

## 2022-08-29 MED ORDER — ORAL CARE MOUTH RINSE: 15.0000 mL | OROMUCOSAL | Status: DC | PRN

## 2022-08-29 MED ORDER — INFLUENZA VAC A&B SA ADJ QUAD 0.5 ML IM PRSY
0.5000 mL | PREFILLED_SYRINGE | INTRAMUSCULAR | Status: AC
  Administered 2022-08-31: 0.5 mL via INTRAMUSCULAR
  Filled 2022-08-29: qty 0.5

## 2022-08-29 MED ORDER — POLYETHYLENE GLYCOL 3350 17 G PO PACK: 17.0000 g | PACK | Freq: Every day | ORAL | Status: DC | PRN

## 2022-08-29 MED ORDER — METHOCARBAMOL 1000 MG/10ML IJ SOLN: 500.0000 mg | Freq: Four times a day (QID) | INTRAVENOUS | Status: DC | PRN

## 2022-08-29 MED ORDER — ISOSORBIDE MONONITRATE ER 30 MG PO TB24
45.0000 mg | ORAL_TABLET | Freq: Every day | ORAL | Status: DC
  Administered 2022-08-30 – 2022-08-31 (×2): 45 mg via ORAL
  Filled 2022-08-29 (×2): qty 2

## 2022-08-29 NOTE — Anesthesia Preprocedure Evaluation (Signed)
Anesthesia Evaluation  Patient identified by MRN, date of birth, ID band Patient awake    Reviewed: Allergy & Precautions, NPO status , Patient's Chart, lab work & pertinent test results  Airway Mallampati: II  TM Distance: >3 FB Neck ROM: Full    Dental  (+) Edentulous Upper, Edentulous Lower   Pulmonary COPD, former smoker,    Pulmonary exam normal        Cardiovascular hypertension, Pt. on medications and Pt. on home beta blockers + CAD, + Past MI (2018), + CABG and +CHF   Rhythm:Regular Rate:Normal  ECHO 05/23: IMPRESSIONS  1. Left ventricular ejection fraction, by estimation, is 60 to 65%. The  left ventricle has normal function. The left ventricle has no regional  wall motion abnormalities. There is mild left ventricular hypertrophy.  Left ventricular diastolic function  could not be evaluated.  2. Right ventricular systolic function is normal. The right ventricular  size is normal. There is normal pulmonary artery systolic pressure. The  estimated right ventricular systolic pressure is 16.1 mmHg.  3. A small pericardial effusion is present. The pericardial effusion is  circumferential. There is no evidence of cardiac tamponade.  4. The mitral valve is abnormal. Trivial mitral valve regurgitation.  5. The aortic valve was not well visualized. There is mild calcification  of the aortic valve. Aortic valve regurgitation is not visualized. Mild to  moderate aortic valve stenosis. Aortic valve area, by VTI measures 1.28  cm. Aortic valve mean gradient  measures 17.0 mmHg. Aortic valve Vmax measures 2.65 m/s. DI is 0.41.  6. The inferior vena cava is normal in size with <50% respiratory  variability, suggesting right atrial pressure of 8 mmHg.   Comparison(s): Changes from prior study are noted. 05/08/2020: LVEF 55-60%,  small pericardial effusion.    Neuro/Psych Seizures -,  negative psych ROS    GI/Hepatic negative GI ROS, Neg liver ROS,   Endo/Other  diabetes, Type 2, Insulin Dependent  Renal/GU CRFRenal disease  negative genitourinary   Musculoskeletal  (+) Arthritis , Osteoarthritis,  Right hip fx   Abdominal Normal abdominal exam  (+)   Peds  Hematology  (+) Blood dyscrasia, anemia ,   Anesthesia Other Findings   Reproductive/Obstetrics                           Anesthesia Physical Anesthesia Plan  ASA: 3  Anesthesia Plan: General   Post-op Pain Management:    Induction: Intravenous  PONV Risk Score and Plan: 3 and Ondansetron, Dexamethasone and Treatment may vary due to age or medical condition  Airway Management Planned: Mask and Oral ETT  Additional Equipment: None  Intra-op Plan:   Post-operative Plan: Extubation in OR  Informed Consent: I have reviewed the patients History and Physical, chart, labs and discussed the procedure including the risks, benefits and alternatives for the proposed anesthesia with the patient or authorized representative who has indicated his/her understanding and acceptance.     Dental advisory given  Plan Discussed with: CRNA  Anesthesia Plan Comments: (Lab Results      Component                Value               Date                      WBC  9.5                 08/29/2022                HGB                      11.1 (L)            08/29/2022                HCT                      33.6 (L)            08/29/2022                MCV                      92.3                08/29/2022                PLT                      120 (L)             08/29/2022           Lab Results      Component                Value               Date                      NA                       140                 08/29/2022                K                        3.5                 08/29/2022                CO2                      16 (L)              08/29/2022                GLUCOSE                   242 (H)             08/29/2022                BUN                      49 (H)              08/29/2022                CREATININE               2.41 (H)            08/29/2022  CALCIUM                  7.3 (L)             08/29/2022                EGFR                     35 (L)              05/19/2022                GFRNONAA                 20 (L)              08/29/2022          )       Anesthesia Quick Evaluation

## 2022-08-29 NOTE — H&P (Signed)
History and Physical    Patient: [name] EBR:[id number] DOB: 03/11/1941 DOA: 08/29/2022 DOS: the patient was seen and examined on 08/29/2022 PCP: Isaac Bliss, Rayford Halsted, MD  Patient coming from: Home - lives with husband; NOK:  [place], [place], [phone]     Chief Complaint: Fall  HPI: [name] is a 81 y.o. female with medical history significant of AML (remote); afib on Eliquis; chronic back pain; COPD; CAD; DM; HTN; HLD; chronic diastolic CHF; uterine/breast cancer; and CAD presenting with a fall.    ER Course:  Working an tripped.  R hip fracture.  On Eliquis, ?last dose.  Head CT negative.  CT shows fracture, for repair tomorrow with Dr. Marcelino Scot.     Review of Systems: As mentioned in the history of present illness. All other systems reviewed and are negative. Past Medical History:  Diagnosis Date   Acute myelogenous leukemia ([place]) 02/2014   Acute pancreatitis    Arthritis    "back" (09/17/2017)   Atrial flutter ([place])    Cardiomyopathy ([place])    Chronic kidney disease, stage II (mild)    Chronic lower back pain    Colon polyps 04/29/2010   TUBULAR ADENOMA AND A SERRATED ADENOMA   COPD (chronic obstructive pulmonary disease) ([place])    CORONARY ARTERY DISEASE 12/24/2007   DIABETES MELLITUS, TYPE II 07/13/2007   History of blood transfusion 2015   "related to leukemia"   History of kidney stones 12/24/2007   History of uterine cancer    HYPERLIPIDEMIA 12/24/2007   HYPERTENSION 07/13/2007   NSTEMI (non-ST elevated myocardial infarction) ([place]) 09/17/2017   Uterine cancer ([place])    Past Surgical History:  Procedure Laterality Date   ABDOMINAL HYSTERECTOMY     "still have my ovaries"   BREAST BIOPSY Left 09/22/2019   BREAST LUMPECTOMY Left 10/24/2019   BREAST LUMPECTOMY WITH RADIOACTIVE SEED LOCALIZATION Left 10/24/2019   Procedure: LEFT BREAST LUMPECTOMY WITH RADIOACTIVE SEED LOCALIZATION;  Surgeon: Jovita Kussmaul, MD;  Location: [facility];  Service: General;  Laterality: Left;   CARDIAC CATHETERIZATION  2002   CARDIAC CATHETERIZATION  09/17/2017   CATARACT EXTRACTION W/ INTRAOCULAR LENS  IMPLANT, BILATERAL Bilateral    COLONOSCOPY W/ BIOPSIES AND POLYPECTOMY  2011   CORONARY STENT INTERVENTION N/A 09/21/2017   Procedure: CORONARY STENT INTERVENTION;  Surgeon: Troy Sine, MD;  Location: [place];  Service: Cardiovascular;  Laterality: N/A;   DILATION AND CURETTAGE OF UTERUS     ESOPHAGOGASTRODUODENOSCOPY N/A 01/30/2022   Procedure: ESOPHAGOGASTRODUODENOSCOPY (EGD);  Surgeon: Milus Banister, MD;  Location: [facility];  Service: Endoscopy;  Laterality: N/A;   EUS N/A 01/30/2022   Procedure: UPPER ENDOSCOPIC ULTRASOUND (EUS) RADIAL;  Surgeon: Milus Banister, MD;  Location: [facility];  Service: Endoscopy;  Laterality: N/A;   EXCISIONAL HEMORRHOIDECTOMY  X 2   FINE NEEDLE ASPIRATION N/A 01/30/2022   Procedure: FINE NEEDLE ASPIRATION (FNA) LINEAR;  Surgeon: Milus Banister, MD;  Location: [facility];  Service: Endoscopy;  Laterality: N/A;   GANGLION CYST EXCISION Left X 3   INTRAVASCULAR PRESSURE WIRE/FFR STUDY N/A 09/17/2017   Procedure: INTRAVASCULAR PRESSURE WIRE/FFR STUDY;  Surgeon: Nelva Bush, MD;  Location: [facility];  Service: Cardiovascular;  Laterality: N/A;   NASAL SINUS SURGERY     PORTA CATH INSERTION Right 2013   RIGHT/LEFT HEART CATH AND CORONARY ANGIOGRAPHY N/A 09/17/2017   Procedure: RIGHT/LEFT HEART CATH AND CORONARY ANGIOGRAPHY;  Surgeon: Nelva Bush, MD;  Location:  [facility];  Service: Cardiovascular;  Laterality: N/A;   TUBAL LIGATION     Social History:  reports that she quit smoking about 21 years ago. Her smoking use included cigarettes. She has a 20.00 pack-year smoking history. She has never used smokeless tobacco. She reports that she does not drink alcohol and does not use drugs.  No Known Allergies  Family History  Problem Relation  Age of Onset   COPD Father    Esophageal cancer Father    Breast cancer Sister 84   Lung cancer Sister    Esophageal cancer Brother    Suicidality Brother    Lung cancer Sister    Lung cancer Sister    Cervical cancer Sister    Congestive Heart Failure Mother    Heart attack Maternal Grandmother    Skin cancer Maternal Grandfather    Heart attack Maternal Grandfather    Stroke Paternal Grandmother 51   Heart attack Paternal Grandfather    Cancer Maternal Uncle        unknown type, dx. >65   Cancer Paternal Aunt        unknown type, dx. >50   Cancer Paternal Uncle        unknown type, dx. >50   Cirrhosis Maternal Uncle    Skin cancer Daughter    Leukemia Cousin        maternal first cousin; dx. >50, in remission   Cancer Niece        unknown type behind her ear, dx. 30s/40s   Colon cancer Neg Hx     Prior to Admission medications   Medication Sig Start Date End Date Taking? Authorizing Provider  apixaban (ELIQUIS) 2.5 MG TABS tablet Take 1 tablet (2.5 mg total) by mouth 2 (two) times daily. 04/21/22   Domenic Polite, MD  atorvastatin (LIPITOR) 40 MG tablet TAKE 1 TABLET EVERY DAY 12/30/21   Isaac Bliss, Rayford Halsted, MD  Blood Glucose Monitoring Suppl (TRUE METRIX AIR GLUCOSE METER) w/Device KIT USE AS DIRECTED 08/12/22   Isaac Bliss, Rayford Halsted, MD  clopidogrel (PLAVIX) 75 MG tablet TAKE 1 TABLET EVERY EVENING 12/30/21   Isaac Bliss, Rayford Halsted, MD  cyanocobalamin (VITAMIN B12) 1000 MCG/ML injection INJECT 1ML IN THE MUSCLE EVERY 30 DAYS AS DIRECTED 08/28/22   Isaac Bliss, Rayford Halsted, MD  empagliflozin (JARDIANCE) 25 MG TABS tablet Take 1 tablet (25 mg total) by mouth daily. 05/08/22   Lyda Jester M, PA-C  fluticasone furoate-vilanterol (BREO ELLIPTA) 200-25 MCG/ACT AEPB Inhale 1 puff into the lungs as needed (wheezing SOB).    [provider]  HYDROcodone-acetaminophen (NORCO) 5-325 MG tablet Take 1 tablet by mouth every 6 (six) hours as needed for moderate  pain. 08/07/22   Isaac Bliss, Rayford Halsted, MD  insulin aspart (NOVOLOG FLEXPEN) 100 UNIT/ML FlexPen Max daily 45 units 08/13/22   Shamleffer, Melanie Crazier, MD  insulin glargine, 2 Unit Dial, (TOUJEO MAX SOLOSTAR) 300 UNIT/ML Solostar Pen Inject 16 Units into the skin at bedtime. 08/13/22   Shamleffer, Melanie Crazier, MD  Insulin Pen Needle 32G X 4 MM MISC 1 Device by Does not apply route in the morning, at noon, in the evening, and at bedtime. 08/13/22   Shamleffer, Melanie Crazier, MD  isosorbide mononitrate (IMDUR) 30 MG 24 hr tablet Take 1 tablet (30 mg total) by mouth daily. TAKE 1 AND 1/2 TABLETS EVERY DAY 04/21/22   Domenic Polite, MD  letrozole [facility]) 2.5 MG tablet Take 1 tablet (2.5 mg total)  by mouth daily. 02/14/22   Owens Shark, NP  levETIRAcetam (KEPPRA) 500 MG tablet Take 1 tablet (500 mg total) by mouth 2 (two) times daily. 01/20/22   Alric Ran, MD  lidocaine 4 % Place 1 patch onto the skin daily. 04/21/22   Domenic Polite, MD  lipase/protease/amylase (CREON) 36000 UNITS CPEP capsule Take 2 capsules (72,000 Units total) by mouth 3 (three) times daily with meals. Patient taking differently: Take 72,000 Units by mouth as needed. 12/17/21   Esterwood, Amy S, PA-C  LORazepam (ATIVAN) 0.5 MG tablet TAKE 1 TABLET BY MOUTH EVERY 8 HOURS AS NEEDED FOR ANXIETY(DO NOT EXCEED MORE THAN 2 TABS IN 24 HRS) 08/07/22   Isaac Bliss, Rayford Halsted, MD  MAGNESIUM OXIDE 400 PO Take 400 mg by mouth 2 (two) times daily. 06/08/18   [provider]  metoprolol succinate (TOPROL-XL) 25 MG 24 hr tablet Take 0.5 tablets (12.5 mg total) by mouth daily. Take with or immediately following a meal. 05/08/22   Simmons, Brittainy M, PA-C  nitroGLYCERIN (NITROSTAT) 0.4 MG SL tablet PLACE 1 TABLET (0.4 MG TOTAL) UNDER THE TONGUE EVERY 5 (FIVE) MINUTES AS NEEDED FOR CHEST PAIN. 05/23/21   Isaac Bliss, Rayford Halsted, MD  polyethylene glycol (MIRALAX / GLYCOLAX) 17 g packet Take 17 g by mouth daily as needed for mild  constipation.    [provider]  potassium chloride (KLOR-CON) 10 MEQ tablet TAKE 1 TABLET EVERY DAY 12/30/21   Isaac Bliss, Rayford Halsted, MD  potassium chloride (KLOR-CON) 10 MEQ tablet Take 1 tablet by mouth twice daily for 3 days, then resume once daily dosing thereafter 05/19/22   Nahser, Wonda Cheng, MD  SYRINGE-NEEDLE, DISP, 3 ML (BD SAFETYGLIDE SYRINGE/NEEDLE) 25G X 1" 3 ML MISC Use for B12 injections 03/05/22   Isaac Bliss, Rayford Halsted, MD  torsemide (DEMADEX) 20 MG tablet Take 2 tablets (40 mg total) by mouth daily. 04/21/22   Domenic Polite, MD  torsemide (DEMADEX) 20 MG tablet Take 2 tablets ($RemoveBe'40mg'TkfvivHrt$ ) by mouth twice daily for 3 days, then resume once daily dosing thereafter 05/19/22   Nahser, Wonda Cheng, MD  TRUE METRIX BLOOD GLUCOSE TEST test strip TEST BLOOD SUGAR THREE TIMES DAILY 08/12/22   Isaac Bliss, Rayford Halsted, MD  valsartan (DIOVAN) 80 MG tablet Take 1 tablet (80 mg total) by mouth daily. 05/19/22   Nahser, Wonda Cheng, MD    Physical Exam: Vitals:   08/29/22 1430 08/29/22 1445 08/29/22 1732 08/29/22 1745  BP: (!) 132/48 (!) 129/47  129/76  Pulse: (!) 53 (!) 49  62  Resp: (!) 22 (!) 21  20  Temp:   (!) 97.4 F (36.3 C)   TempSrc:   Oral   SpO2: 90% (!) 88%  (!) 88%  Weight:      Height:       General:  Appears to be in pain at rest and with severe discomfort with minimal movement Eyes:  EOMI, normal lids, iris ENT:  grossly normal hearing, lips & tongue, mmm; artificial dentition Neck:  no LAD, masses or thyromegaly Cardiovascular:  RRR, no m/r/g. No LE edema.  Respiratory:   CTA bilaterally with no wheezes/rales/rhonchi.  Normal respiratory effort. Abdomen:  soft, NT, ND Skin:  no rash or induration seen on limited exam Musculoskeletal:  marked R hip pain with minimal movement but no obvious limb shortening Lower extremity:  No LE edema.  Limited foot exam with no ulcerations.  2+ distal pulses. Psychiatric:  blunted mood and affect, speech fluent  and appropriate,  AOx3 Neurologic:  CN 2-12 grossly intact, moves all extremities in coordinated fashion other than RLE   Radiological Exams on Admission: Independently reviewed - see discussion in A/P where applicable  CT HEAD WO CONTRAST ( )  Result Date: 08/29/2022 CLINICAL DATA:  Trauma, fall EXAM: CT HEAD WITHOUT CONTRAST TECHNIQUE: Contiguous axial images were obtained from the base of the skull through the vertex without intravenous contrast. RADIATION DOSE REDUCTION: This exam was performed according to the departmental dose-optimization program which includes automated exposure control, adjustment of the mA and/or kV according to patient size and/or use of iterative reconstruction technique. COMPARISON:  12/05/2021 FINDINGS: Brain: No acute intracranial findings are seen in noncontrast CT brain. There are no signs of bleeding within the cranium. Cortical sulci are prominent. There is decreased density in periventricular and subcortical white matter. There is 1.7 cm dense calcification inseparable from the anterior falx, possibly meningioma with no significant interval change. There is no adjacent edema. Vascular: There are scattered arterial calcifications. Skull: No acute findings are seen. Sinuses/Orbits: There is mild mucosal thickening in ethmoid and sphenoid sinuses. Other: None. IMPRESSION: No acute intracranial findings are seen. Atrophy. Small-vessel disease. There is 1.7 cm dense calcification inseparable from anterior falx suggesting meningioma with no significant interval change. Electronically Signed   By: Ernie Avena M.D.   On: 08/29/2022 15:45   DG FEMUR, MIN 2 VIEWS RIGHT  Result Date: 08/29/2022 CLINICAL DATA:  Larey Seat from standing while at work.  Right hip pain. EXAM: RIGHT FEMUR 2 VIEWS COMPARISON:  Right hip radiographs 08/29/2022, CT abdomen and pelvis 04/17/2022 FINDINGS: Mild-to-moderate medial compartment of the knee joint space narrowing. There is subtle linear lucency overlying  the right intertrochanteric region on lateral view. Mild chronic enthesopathic change at the quadriceps insertion on the patella. No joint effusion. Mild right femoroacetabular joint space narrowing and peripheral acetabular degenerative osteophytosis. There are dense vascular calcifications. IMPRESSION: There is craniocaudal linear lucency overlying the superior intertrochanteric region on lateral view only. This could represent an overlying skin marking. It is difficult to exclude an acute nondisplaced fracture in this region. Electronically Signed   By: Neita Garnet M.D.   On: 08/29/2022 15:27   DG Tibia/Fibula Right  Result Date: 08/29/2022 CLINICAL DATA:  Trauma, fall EXAM: RIGHT TIBIA AND FIBULA - 2 VIEW COMPARISON:  None Available. FINDINGS: No recent fracture or dislocation is seen. Osteopenia is seen in bony structures. There is soft tissue swelling around the ankle. There is edema in subcutaneous plane in the lower leg. IMPRESSION: No fracture or dislocation is seen in right tibia and fibula. Electronically Signed   By: Ernie Avena M.D.   On: 08/29/2022 15:24   DG Chest 1 View  Result Date: 08/29/2022 CLINICAL DATA:  Fall.  RIGHT-sided severe pain EXAM: CHEST  1 VIEW COMPARISON:  04/16/2022 FINDINGS: RIGHT-sided port. Enlarged cardiac silhouette. RIGHT pleural effusion. No pneumothorax. No fracture. IMPRESSION: 1. Chronic RIGHT effusion. 2. Cardiomegaly. 3. No acute chest trauma. Electronically Signed   By: Genevive Bi M.D.   On: 08/29/2022 14:04   DG HIP UNILAT W OR W/O PELVIS 2-3 VIEWS RIGHT  Result Date: 08/29/2022 CLINICAL DATA:  Fall, right hip pain EXAM: DG HIP (WITH OR WITHOUT PELVIS) 2-3V RIGHT COMPARISON:  04/17/2022 FINDINGS: Bones appear diffusely demineralized. There is no evidence of hip fracture or dislocation. Joint spaces are relatively well preserved. Atherosclerotic vascular calcifications. IMPRESSION: No evidence of right hip fracture or dislocation.  Electronically Signed   By: Janyth Pupa  Plundo D.O.   On: 08/29/2022 14:02    EKG: Independently reviewed.  Afib with rate 61; nonspecific ST changes with no evidence of acute ischemia   Labs on Admission: I have personally reviewed the available labs and imaging studies at the time of the admission.  Pertinent labs:    CO2 16 Glucose 242 BUN 49/Creatinine 2.41/GFR 20; baseline GFR 25-29 WBC 9.5 Hgb 11.1 Platelets 120   Assessment and Plan: Principal Problem:   Hip fracture (HCC) Active Problems:   Hyperlipidemia   Essential hypertension   Chronic back pain   CKD (chronic kidney disease) stage 4, GFR 15-29 ml/min (HCC)   AML (acute myeloblastic leukemia) ([place])   Port catheter in place   Type 2 diabetes mellitus with hyperglycemia, with long-term current use of insulin (HCC)   Carcinoma of upper-outer quadrant of left breast in female, estrogen receptor positive (HCC)   Seizure (HCC)   Paroxysmal atrial fibrillation (HCC)    Hip fracture -Apparently mechanical fall resulting in hip fracture -Orthopedics consulted -NPO after midnight in anticipation of surgical repair tomorrow -SCDs overnight, start Lovenox post-operatively (or as per ortho) -Pain control with Tylenol, Robaxin, Oxycodone, and Dilaudid prn; she has chronic pain syndrome and so may be more resistant to narcotics than some other patients -TOC team consult for rehab placement -Will need PT consult post-operatively -Hip fracture order set utilized -TXA per orthopedics -Fascia iliacus block ordered per anesthesia  Pre-operative stratification -Orthopedic/spinal surgery is associated with an intermediate (1-5%) cardiovascular risk for cardiac death and nonfatal MI -With her h/o insulin-requiring DM, CAD, CHF, and kidney disease, her revised cardiac index gives a risk estimate of 15% - high risk -Because of this risk, she is recommended to have pre-operative EKG testing prior to surgery; this was done in the  ER -Her Detsky's Modified Cardiac Risk Index score is 15, with a low cardiac risk -Although her risk is elevated by her cardiac index, she has not had recent issues and so it is reasonable for her to go to the OR without additional evaluation  Afib -She reports that she was started on Eliquis fairly recently -She is finding it difficult to afford this medication and so has only been taking it once a day -She is uncertain whether she last took it this AM or last night -She thinks she may need to go on Coumadin due to cost -Will hold for now but needs ongoing discussion when considering which medication to restart (Eliquis needs to be BID to be effective)  Chronic diastolic CHF -She was admitted for this issue last in 04/2022 and did not have frank pulmonary edema at that time -Echo on 5/11 with preserved EF -She currently does not endorse symptoms suggestive of decompensation -Previously started on Entresto but this was stopped due to "itching" and changed to valsartan -Hold Jardiance  H/o AML and recent breast cancer -Followed by Dr. [name], last seen 7/24 -She is in clinical remission and osseous changes on prior imaging are thought to be residual from AML treatment (also in remission from this) -She has a port-a-cath because she wanted to keep it, currently accessed -Hold letrozole for now, as this may increase her osteoporosis risk; it was started in 10/2019 and ongoing use will need to be d/w oncology  Seizure d/o -Continue Keppra   COPD -Uses Breo prn; will hold and use albuterol instead   HTN -Continue amlodipine, metoprolol -Will also add prn hydralazine   HLD -Continue Lipitor   DM -Last A1c was  9.9, indicating poor control -Continue Toujeo (Semglee per formulary) -Will cover with moderate-scale SSI for now -DM coordinator consulted   Chronic pain -I have reviewed this patient in the [place] Controlled Substances Reporting System.  She is receiving medications from  only one provider and appears to be taking them as prescribed. -She is at high risk of opioid misuse or diversion -Continue Ativan -She is also in significant pain for now and less likely to respond to typical pain medications given her tolerance   CAD -s/p stent -Too high risk for CABG in 2018 -Denies CP -Continue Imdur -Resume Plavix post-procedure   Stage 4 CKD -Appears to be mildly worse than prior baseline - new AKI vs. Progressive disease? -Needs ongoing monitoring -Hold diuresis for now -Hold Jardiance and valsartan  Goals of care -She currently states that she would prefer to be a full code, but only for a short period of time      Advance Care Planning:   Code Status: Full Code   Consults: Orthopedics; diabetes coordinator; anesthesia; nutrition; TOC team; will need PT post-operatively  DVT Prophylaxis: SCDs  Family Communication: None present; she declined to have me call her husband at the time of admission  Severity of Illness: The appropriate patient status for this patient is INPATIENT. Inpatient status is judged to be reasonable and necessary in order to provide the required intensity of service to ensure the patient's safety. The patient's presenting symptoms, physical exam findings, and initial radiographic and laboratory data in the context of their chronic comorbidities is felt to place them at high risk for further clinical deterioration. Furthermore, it is not anticipated that the patient will be medically stable for discharge from the hospital within 2 midnights of admission.   * I certify that at the point of admission it is my clinical judgment that the patient will require inpatient hospital care spanning beyond 2 midnights from the point of admission due to high intensity of service, high risk for further deterioration and high frequency of surveillance required.*  Author: Karmen Bongo, MD 08/29/2022 6:06 PM  For on call review [email or web address].

## 2022-08-29 NOTE — ED Triage Notes (Signed)
Pt fell from standing position while at work, denies LOC, c/o right hip pain

## 2022-08-29 NOTE — ED Provider Notes (Addendum)
[facility] EMERGENCY DEPARTMENT Provider Note   CSN: [id number] Arrival date & time: 08/29/22  1250     History  Chief Complaint  Patient presents with   [name]    [name] is a 81 y.o. female.  Pt s/p fall just pta today. Was at work, states foot got caught on something, tripped, fell forward and onto right hip. C/o right hip pain post fall, constant, dull, non radiating. No associated numbness/weakness. Denies faintness or dizziness prior to fall, indicates felt fine/at baseline earlier today. Denies fever or chills. No loc. No severe headaches. No neck or back pain. Denies other pain or injury.   The history is provided by the patient, a friend, medical records and the EMS personnel.  Fall Pertinent negatives include no chest pain, no abdominal pain, no headaches and no shortness of breath.       Home Medications Prior to Admission medications   Medication Sig Start Date End Date Taking? Authorizing Provider  apixaban (ELIQUIS) 2.5 MG TABS tablet Take 1 tablet (2.5 mg total) by mouth 2 (two) times daily. 04/21/22   Domenic Polite, MD  atorvastatin (LIPITOR) 40 MG tablet TAKE 1 TABLET EVERY DAY 12/30/21   Isaac Bliss, Rayford Halsted, MD  Blood Glucose Monitoring Suppl (TRUE METRIX AIR GLUCOSE METER) w/Device KIT USE AS DIRECTED 08/12/22   Isaac Bliss, Rayford Halsted, MD  clopidogrel (PLAVIX) 75 MG tablet TAKE 1 TABLET EVERY EVENING 12/30/21   Isaac Bliss, Rayford Halsted, MD  cyanocobalamin (VITAMIN B12) 1000 MCG/ML injection INJECT 1ML IN THE MUSCLE EVERY 30 DAYS AS DIRECTED 08/28/22   Isaac Bliss, Rayford Halsted, MD  empagliflozin (JARDIANCE) 25 MG TABS tablet Take 1 tablet (25 mg total) by mouth daily. 05/08/22   Lyda Jester M, PA-C  fluticasone furoate-vilanterol (BREO ELLIPTA) 200-25 MCG/ACT AEPB Inhale 1 puff into the lungs as needed (wheezing SOB).    [provider]  HYDROcodone-acetaminophen (NORCO) 5-325 MG tablet Take 1 tablet by mouth every  6 (six) hours as needed for moderate pain. 08/07/22   Isaac Bliss, Rayford Halsted, MD  insulin aspart (NOVOLOG FLEXPEN) 100 UNIT/ML FlexPen Max daily 45 units 08/13/22   Shamleffer, Melanie Crazier, MD  insulin glargine, 2 Unit Dial, (TOUJEO MAX SOLOSTAR) 300 UNIT/ML Solostar Pen Inject 16 Units into the skin at bedtime. 08/13/22   Shamleffer, Melanie Crazier, MD  Insulin Pen Needle 32G X 4 MM MISC 1 Device by Does not apply route in the morning, at noon, in the evening, and at bedtime. 08/13/22   Shamleffer, Melanie Crazier, MD  isosorbide mononitrate (IMDUR) 30 MG 24 hr tablet Take 1 tablet (30 mg total) by mouth daily. TAKE 1 AND 1/2 TABLETS EVERY DAY 04/21/22   Domenic Polite, MD  letrozole [facility]) 2.5 MG tablet Take 1 tablet (2.5 mg total) by mouth daily. 02/14/22   Owens Shark, NP  levETIRAcetam (KEPPRA) 500 MG tablet Take 1 tablet (500 mg total) by mouth 2 (two) times daily. 01/20/22   Alric Ran, MD  lidocaine 4 % Place 1 patch onto the skin daily. 04/21/22   Domenic Polite, MD  lipase/protease/amylase (CREON) 36000 UNITS CPEP capsule Take 2 capsules (72,000 Units total) by mouth 3 (three) times daily with meals. Patient taking differently: Take 72,000 Units by mouth as needed. 12/17/21   Esterwood, Amy S, PA-C  LORazepam (ATIVAN) 0.5 MG tablet TAKE 1 TABLET BY MOUTH EVERY 8 HOURS AS NEEDED FOR ANXIETY(DO NOT EXCEED MORE THAN 2 TABS IN 24 HRS) 08/07/22  Isaac Bliss, Rayford Halsted, MD  MAGNESIUM OXIDE 400 PO Take 400 mg by mouth 2 (two) times daily. 06/08/18   [provider]  metoprolol succinate (TOPROL-XL) 25 MG 24 hr tablet Take 0.5 tablets (12.5 mg total) by mouth daily. Take with or immediately following a meal. 05/08/22   Simmons, Brittainy M, PA-C  nitroGLYCERIN (NITROSTAT) 0.4 MG SL tablet PLACE 1 TABLET (0.4 MG TOTAL) UNDER THE TONGUE EVERY 5 (FIVE) MINUTES AS NEEDED FOR CHEST PAIN. 05/23/21   Isaac Bliss, Rayford Halsted, MD  polyethylene glycol (MIRALAX / GLYCOLAX) 17 g packet Take 17  g by mouth daily as needed for mild constipation.    [provider]  potassium chloride (KLOR-CON) 10 MEQ tablet TAKE 1 TABLET EVERY DAY 12/30/21   Isaac Bliss, Rayford Halsted, MD  potassium chloride (KLOR-CON) 10 MEQ tablet Take 1 tablet by mouth twice daily for 3 days, then resume once daily dosing thereafter 05/19/22   Nahser, Wonda Cheng, MD  SYRINGE-NEEDLE, DISP, 3 ML (BD SAFETYGLIDE SYRINGE/NEEDLE) 25G X 1" 3 ML MISC Use for B12 injections 03/05/22   Isaac Bliss, Rayford Halsted, MD  torsemide (DEMADEX) 20 MG tablet Take 2 tablets (40 mg total) by mouth daily. 04/21/22   Domenic Polite, MD  torsemide (DEMADEX) 20 MG tablet Take 2 tablets (65m) by mouth twice daily for 3 days, then resume once daily dosing thereafter 05/19/22   Nahser, PWonda Cheng MD  TRUE METRIX BLOOD GLUCOSE TEST test strip TEST BLOOD SUGAR THREE TIMES DAILY 08/12/22   HIsaac Bliss ERayford Halsted MD  valsartan (DIOVAN) 80 MG tablet Take 1 tablet (80 mg total) by mouth daily. 05/19/22   Nahser, PWonda Cheng MD      Allergies    Patient has no known allergies.    Review of Systems   Review of Systems  Constitutional:  Negative for chills and fever.  Eyes:  Negative for pain and visual disturbance.  Respiratory:  Negative for shortness of breath.   Cardiovascular:  Negative for chest pain.  Gastrointestinal:  Negative for abdominal pain, nausea and vomiting.  Genitourinary:  Negative for flank pain.  Musculoskeletal:  Negative for back pain and neck pain.  Skin:  Negative for wound.  Neurological:  Negative for weakness, numbness and headaches.  Psychiatric/Behavioral:  Negative for confusion.     Physical Exam Updated Vital Signs BP 128/70 (BP Location: Right Arm)   Pulse (!) 51   Temp 98 F (36.7 C) (Oral)   Resp 19   Ht 1.575 m (5' 2")   Wt 68 kg   SpO2 95%   BMI 27.44 kg/m  Physical Exam Vitals and nursing note reviewed.  Constitutional:      Appearance: Normal appearance. She is well-developed.  HENT:      Head: Atraumatic.     Nose: Nose normal.     Mouth/Throat:     Mouth: Mucous membranes are moist.  Eyes:     General: No scleral icterus.    Conjunctiva/sclera: Conjunctivae normal.     Pupils: Pupils are equal, round, and reactive to light.  Neck:     Vascular: No carotid bruit.     Trachea: No tracheal deviation.  Cardiovascular:     Rate and Rhythm: Bradycardia present. Rhythm irregular.     Pulses: Normal pulses.     Heart sounds: Normal heart sounds. No murmur heard.    No friction rub. No gallop.  Pulmonary:     Effort: Pulmonary effort is normal. No respiratory distress.  Breath sounds: Normal breath sounds.  Chest:     Chest wall: No tenderness.  Abdominal:     General: There is no distension.     Palpations: Abdomen is soft.     Tenderness: There is no abdominal tenderness.     Comments: No abd contusion or bruising.   Genitourinary:    Comments: No cva tenderness.  Musculoskeletal:        General: No swelling.     Cervical back: Normal range of motion and neck supple. No rigidity or tenderness. No muscular tenderness.     Comments: Tenderness/pain right hip, otherwise good rom bil extremities without pain or other focal bony tenderness. Distal pulses palp bil. Pelvis grossly stable. CTLS spine, non tender, aligned, no step off.    Skin:    General: Skin is warm and dry.     Findings: No rash.  Neurological:     Mental Status: She is alert.     Comments: Alert, speech normal.  GCS 15. Motor/sens grossly intact bil. RLE nvi.   Psychiatric:        Mood and Affect: Mood normal.     ED Results / Procedures / Treatments   Labs (all labs ordered are listed, but only abnormal results are displayed) Results for orders placed or performed during the hospital encounter of 08/29/22  CBC  Result Value Ref Range   WBC 9.5 4.0 - 10.5 K/uL   RBC 3.64 (L) 3.87 - 5.11 MIL/uL   Hemoglobin 11.1 (L) 12.0 - 15.0 g/dL   HCT 33.6 (L) 36.0 - 46.0 %   MCV 92.3 80.0 - 100.0 fL    MCH 30.5 26.0 - 34.0 pg   MCHC 33.0 30.0 - 36.0 g/dL   RDW 13.1 11.5 - 15.5 %   Platelets 120 (L) 150 - 400 K/uL   nRBC 0.0 0.0 - 0.2 %  Basic metabolic panel  Result Value Ref Range   Sodium 140 135 - 145 mmol/L   Potassium 3.5 3.5 - 5.1 mmol/L   Chloride 116 (H) 98 - 111 mmol/L   CO2 16 (L) 22 - 32 mmol/L   Glucose, Bld 242 (H) 70 - 99 mg/dL   BUN 49 (H) 8 - 23 mg/dL   Creatinine, Ser 2.41 (H) 0.44 - 1.00 mg/dL   Calcium 7.3 (L) 8.9 - 10.3 mg/dL   GFR, Estimated 20 (L) >60 mL/min   Anion gap 8 5 - 15  Type and screen  Result Value Ref Range   ABO/RH(D) O NEG    Antibody Screen NEG    Sample Expiration      09/01/2022,2359 Performed at [facility], [address], [place],  [address]    *Note: Due to a large number of results and/or encounters for the requested time period, some results have not been displayed. A complete set of results can be found in Results Review.     EKG None  Radiology CT HEAD WO CONTRAST (5MM)  Result Date: 08/29/2022 CLINICAL DATA:  Trauma, fall EXAM: CT HEAD WITHOUT CONTRAST TECHNIQUE: Contiguous axial images were obtained from the base of the skull through the vertex without intravenous contrast. RADIATION DOSE REDUCTION: This exam was performed according to the departmental dose-optimization program which includes automated exposure control, adjustment of the mA and/or kV according to patient size and/or use of iterative reconstruction technique. COMPARISON:  12/05/2021 FINDINGS: Brain: No acute intracranial findings are seen in noncontrast CT brain. There are no signs of bleeding within the  cranium. Cortical sulci are prominent. There is decreased density in periventricular and subcortical white matter. There is 1.7 cm dense calcification inseparable from the anterior falx, possibly meningioma with no significant interval change. There is no adjacent edema. Vascular: There are scattered arterial calcifications. Skull: No acute  findings are seen. Sinuses/Orbits: There is mild mucosal thickening in ethmoid and sphenoid sinuses. Other: None. IMPRESSION: No acute intracranial findings are seen. Atrophy. Small-vessel disease. There is 1.7 cm dense calcification inseparable from anterior falx suggesting meningioma with no significant interval change. Electronically Signed   By: Elmer Picker M.D.   On: 08/29/2022 15:45   DG FEMUR, MIN 2 VIEWS RIGHT  Result Date: 08/29/2022 CLINICAL DATA:  [place] from standing while at work.  Right hip pain. EXAM: RIGHT FEMUR 2 VIEWS COMPARISON:  Right hip radiographs 08/29/2022, CT abdomen and pelvis 04/17/2022 FINDINGS: Mild-to-moderate medial compartment of the knee joint space narrowing. There is subtle linear lucency overlying the right intertrochanteric region on lateral view. Mild chronic enthesopathic change at the quadriceps insertion on the patella. No joint effusion. Mild right femoroacetabular joint space narrowing and peripheral acetabular degenerative osteophytosis. There are dense vascular calcifications. IMPRESSION: There is craniocaudal linear lucency overlying the superior intertrochanteric region on lateral view only. This could represent an overlying skin marking. It is difficult to exclude an acute nondisplaced fracture in this region. Electronically Signed   By: Yvonne Kendall M.D.   On: 08/29/2022 15:27   DG Tibia/Fibula Right  Result Date: 08/29/2022 CLINICAL DATA:  Trauma, fall EXAM: RIGHT TIBIA AND FIBULA - 2 VIEW COMPARISON:  None Available. FINDINGS: No recent fracture or dislocation is seen. Osteopenia is seen in bony structures. There is soft tissue swelling around the ankle. There is edema in subcutaneous plane in the lower leg. IMPRESSION: No fracture or dislocation is seen in right tibia and fibula. Electronically Signed   By: Elmer Picker M.D.   On: 08/29/2022 15:24   DG Chest 1 View  Result Date: 08/29/2022 CLINICAL DATA:  Fall.  RIGHT-sided severe pain  EXAM: CHEST  1 VIEW COMPARISON:  04/16/2022 FINDINGS: RIGHT-sided port. Enlarged cardiac silhouette. RIGHT pleural effusion. No pneumothorax. No fracture. IMPRESSION: 1. Chronic RIGHT effusion. 2. Cardiomegaly. 3. No acute chest trauma. Electronically Signed   By: Suzy Bouchard M.D.   On: 08/29/2022 14:04   DG HIP UNILAT W OR W/O PELVIS 2-3 VIEWS RIGHT  Result Date: 08/29/2022 CLINICAL DATA:  Fall, right hip pain EXAM: DG HIP (WITH OR WITHOUT PELVIS) 2-3V RIGHT COMPARISON:  04/17/2022 FINDINGS: Bones appear diffusely demineralized. There is no evidence of hip fracture or dislocation. Joint spaces are relatively well preserved. Atherosclerotic vascular calcifications. IMPRESSION: No evidence of right hip fracture or dislocation. Electronically Signed   By: Davina Poke D.O.   On: 08/29/2022 14:02    Procedures Procedures    Medications Ordered in ED Medications  HYDROmorphone (DILAUDID) injection 0.5 mg (has no administration in time range)  ondansetron (ZOFRAN) injection 4 mg (has no administration in time range)    ED Course/ Medical Decision Making/ A&P                           Medical Decision Making Problems Addressed: AKI (acute kidney injury) [facility]): acute illness or injury that poses a threat to life or bodily functions Chronic kidney disease (CKD), stage IV (severe) ([place]): chronic illness or injury with exacerbation, progression, or side effects of treatment that poses a threat to life or  bodily functions Closed displaced intertrochanteric fracture of right femur, initial encounter [facility]): acute illness or injury with systemic symptoms that poses a threat to life or bodily functions Fall from slip, trip, or stumble, initial encounter: acute illness or injury with systemic symptoms that poses a threat to life or bodily functions Long term current use of anticoagulant therapy: chronic illness or injury that poses a threat to life or bodily functions Paroxysmal atrial  fibrillation [facility]): chronic illness or injury that poses a threat to life or bodily functions Thrombocytopenia ([place]): chronic illness or injury  Amount and/or Complexity of Data Reviewed Independent Historian: EMS    Details: hx External Data Reviewed: notes. Labs: ordered. Decision-making details documented in ED Course. Radiology: ordered and independent interpretation performed. Decision-making details documented in ED Course. ECG/medicine tests: ordered and independent interpretation performed. Decision-making details documented in ED Course. Discussion of management or test interpretation with external provider(s): Ortho, medicine, discussed pt  Risk Prescription drug management. Parenteral controlled substances. Decision regarding hospitalization.   Iv ns. Continuous pulse ox and cardiac monitoring. Labs ordered/sent. Imaging ordered.   Reviewed nursing notes and prior charts for additional history. External reports reviewed. Additional history from:  Cardiac monitor: sinus rhythm, rate 50.  Labs reviewed/interpreted by me - wbc normal. Aki on ckd. Ns bolus.   Xrays reviewed/interpreted by me - no def fx. Will get ct.   CT reviewed/interpreted by me - no hem on head ct. Hip ct with intertroch fx.   Ortho consulted. Discussed w Dr Marcelino Scot - will take to OR.  Note that pt indicates is still taking eliquis, but cannot remember if she last took it last evening or this AM.   Dr Marcelino Scot indicates will tentatively plan to take to OR tomorrow - keep NPO post MN tonight.  Hold eliquis.  Dilaudid iv. Zofran iv.   Recheck pain improved.   Medicine consulted for admission.  Discussed with Dr Lorin Mercy - will admit.           Final Clinical Impression(s) / ED Diagnoses Final diagnoses:  None    Rx / DC Orders ED Discharge Orders          Ordered    CT HEAD WO CONTRAST (5MM)        08/29/22 1322               Lajean Saver, MD 08/29/22 1650

## 2022-08-29 NOTE — Progress Notes (Signed)
Consult request received for right intertrochanteric hip fracture. Have discussed with the requesting MD patient's stability, pain control, and ongoing work up. I have reviewed x-rays and developed a provisional plan.   Full consultation to follow. Some confusion regarding patient's last dose of Eliquis. Patient is currently posted for right hip fracture repair at 0800 tomorrow.  Appreciate Medicine Service.  Altamese Hinsdale, MD [facility], [facility] [phone]

## 2022-08-29 NOTE — Consult Note (Addendum)
Orthopaedic Trauma Service (OTS) Consultation   Patient ID: [name] MRN: [id number] DOB/AGE: [birth year] 81 y.o.   Reason for Consult: right intertrochanteric hip fracture Referring Physician: Lajean Saver, MD  HPI: [name] is an 81 y.o. female who sustained ground level fall at work today with resulting right hip pain. Plain films inconclusive but CT scan confirmed. Pain is currently moderately severe even at rest but just had stool, aching and dull, sharp and severe with motion, without associated distal tingling or numbness, and improved with narcotics. Denies antecedent hip pain. Eager to return to work in the [employer]. Has a port in place right chest.   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   Acute pancreatitis    Arthritis    "back" (09/17/2017)   Atrial flutter ([name])    Cardiomyopathy ([place])    Chronic kidney disease, stage II (mild)    Chronic lower back pain    Colon polyps 04/29/2010   TUBULAR ADENOMA AND A SERRATED ADENOMA   COPD (chronic obstructive pulmonary disease) ([name])    CORONARY ARTERY DISEASE 12/24/2007   DIABETES MELLITUS, TYPE II 07/13/2007   History of blood transfusion 2015   "related to leukemia"   History of kidney stones 12/24/2007   History of uterine cancer    HYPERLIPIDEMIA 12/24/2007   HYPERTENSION 07/13/2007   NSTEMI (non-ST elevated myocardial infarction) ([place]) 09/17/2017   Uterine cancer ([name])     Past Surgical History:  Procedure Laterality Date   ABDOMINAL HYSTERECTOMY     "still have my ovaries"   BREAST BIOPSY Left 09/22/2019   BREAST LUMPECTOMY Left 10/24/2019   BREAST LUMPECTOMY WITH RADIOACTIVE SEED LOCALIZATION Left 10/24/2019   Procedure: LEFT BREAST LUMPECTOMY WITH RADIOACTIVE SEED LOCALIZATION;  Surgeon: Jovita Kussmaul, MD;  Location: [facility];  Service: General;  Laterality: Left;   CARDIAC CATHETERIZATION  2002   CARDIAC CATHETERIZATION   09/17/2017   CATARACT EXTRACTION W/ INTRAOCULAR LENS  IMPLANT, BILATERAL Bilateral    COLONOSCOPY W/ BIOPSIES AND POLYPECTOMY  2011   CORONARY STENT INTERVENTION N/A 09/21/2017   Procedure: CORONARY STENT INTERVENTION;  Surgeon: Troy Sine, MD;  Location: [facility];  Service: Cardiovascular;  Laterality: N/A;   DILATION AND CURETTAGE OF UTERUS     ESOPHAGOGASTRODUODENOSCOPY N/A 01/30/2022   Procedure: ESOPHAGOGASTRODUODENOSCOPY (EGD);  Surgeon: Milus Banister, MD;  Location: [facility];  Service: Endoscopy;  Laterality: N/A;   EUS N/A 01/30/2022   Procedure: UPPER ENDOSCOPIC ULTRASOUND (EUS) RADIAL;  Surgeon: Milus Banister, MD;  Location: [facility];  Service: Endoscopy;  Laterality: N/A;   EXCISIONAL HEMORRHOIDECTOMY  X 2   FINE NEEDLE ASPIRATION N/A 01/30/2022   Procedure: FINE NEEDLE ASPIRATION (FNA) LINEAR;  Surgeon: Milus Banister, MD;  Location: [facility];  Service: Endoscopy;  Laterality: N/A;   GANGLION CYST EXCISION Left X 3   INTRAVASCULAR PRESSURE WIRE/FFR STUDY N/A 09/17/2017   Procedure: INTRAVASCULAR PRESSURE WIRE/FFR STUDY;  Surgeon: Nelva Bush, MD;  Location: [facility];  Service: Cardiovascular;  Laterality: N/A;   NASAL SINUS SURGERY     PORTA CATH INSERTION Right 2013   RIGHT/LEFT HEART CATH AND CORONARY ANGIOGRAPHY N/A 09/17/2017   Procedure: RIGHT/LEFT HEART CATH AND CORONARY ANGIOGRAPHY;  Surgeon: Nelva Bush, MD;  Location: [facility];  Service: Cardiovascular;  Laterality: N/A;   TUBAL LIGATION      Family History  Problem Relation  Age of Onset   COPD Father    Esophageal cancer Father    Breast cancer Sister 49   Lung cancer Sister    Esophageal cancer Brother    Suicidality Brother    Lung cancer Sister    Lung cancer Sister    Cervical cancer Sister    Congestive Heart Failure Mother    Heart attack Maternal Grandmother    Skin cancer Maternal Grandfather    Heart attack Maternal Grandfather    Stroke  Paternal Grandmother 41   Heart attack Paternal Grandfather    Cancer Maternal Uncle        unknown type, dx. >65   Cancer Paternal Aunt        unknown type, dx. >50   Cancer Paternal Uncle        unknown type, dx. >50   Cirrhosis Maternal Uncle    Skin cancer Daughter    Leukemia Cousin        maternal first cousin; dx. >50, in remission   Cancer Niece        unknown type behind her ear, dx. 30s/40s   Colon cancer Neg Hx     Social History:  reports that she quit smoking about 21 years ago. Her smoking use included cigarettes. She has a 20.00 pack-year smoking history. She has never used smokeless tobacco. She reports that she does not drink alcohol and does not use drugs.  Allergies: No Known Allergies  Medications: I have reviewed her medications.  Results for orders placed or performed during the hospital encounter of 08/29/22 (from the past 48 hour(s))  CBC     Status: Abnormal   Collection Time: 08/29/22  2:22 PM  Result Value Ref Range   WBC 9.5 4.0 - 10.5 K/uL   RBC 3.64 (L) 3.87 - 5.11 MIL/uL   Hemoglobin 11.1 (L) 12.0 - 15.0 g/dL   HCT 33.6 (L) 36.0 - 46.0 %   MCV 92.3 80.0 - 100.0 fL   MCH 30.5 26.0 - 34.0 pg   MCHC 33.0 30.0 - 36.0 g/dL   RDW 13.1 11.5 - 15.5 %   Platelets 120 (L) 150 - 400 K/uL   nRBC 0.0 0.0 - 0.2 %    Comment: Performed at [facility], [place] [address], [place], [place] [address]  Basic metabolic panel     Status: Abnormal   Collection Time: 08/29/22  2:22 PM  Result Value Ref Range   Sodium 140 135 - 145 mmol/L   Potassium 3.5 3.5 - 5.1 mmol/L   Chloride 116 (H) 98 - 111 mmol/L   CO2 16 (L) 22 - 32 mmol/L   Glucose, Bld 242 (H) 70 - 99 mg/dL    Comment: Glucose reference range applies only to samples taken after fasting for at least 8 hours.   BUN 49 (H) 8 - 23 mg/dL   Creatinine, Ser 2.41 (H) 0.44 - 1.00 mg/dL   Calcium 7.3 (L) 8.9 - 10.3 mg/dL   GFR, Estimated 20 (L) >60 mL/min    Comment: (NOTE) Calculated using the CKD-EPI  Creatinine Equation (2021)    Anion gap 8 5 - 15    Comment: Performed at [facility] [address] [place], [place] [address]  Type and screen     Status: None   Collection Time: 08/29/22  2:25 PM  Result Value Ref Range   ABO/RH(D) O NEG    Antibody Screen NEG    Sample Expiration  09/01/2022,2359 Performed at [facility] [address], [place], [place] [address]    *Note: Due to a large number of results and/or encounters for the requested time period, some results have not been displayed. A complete set of results can be found in Results Review.    CT Hip Right Wo Contrast  Result Date: 08/29/2022 CLINICAL DATA:  Hip trauma, fracture suspected, xray done r/o occult hip or pelvic/pubic rami fx EXAM: CT OF THE RIGHT HIP WITHOUT CONTRAST TECHNIQUE: Multidetector CT imaging of the right hip was performed according to the standard protocol. Multiplanar CT image reconstructions were also generated. RADIATION DOSE REDUCTION: This exam was performed according to the departmental dose-optimization program which includes automated exposure control, adjustment of the mA and/or kV according to patient size and/or use of iterative reconstruction technique. COMPARISON:  Same day radiographs FINDINGS: Bones/Joint/Cartilage There is a nondisplaced fracture of the intertrochanteric right proximal femur, and comminuted involvement of the greater trochanter. Unchanged diffuse mixed sclerosis and lucency within visible pelvis. Ligaments Suboptimally assessed by CT. Muscles and Tendons No acute myotendinous abnormality by CT. Soft tissues No focal fluid collection.  Soft tissue swelling along the hip. IMPRESSION: Nondisplaced intertrochanteric fracture of the right proximal femur, with comminuted involvement of the greater trochanter. Unchanged diffuse mixed sclerosis and lucency within the visible pelvis, suggestive of metastatic disease, as noted on prior exams. Electronically Signed   By:  Maurine Simmering M.D.   On: 08/29/2022 15:56   CT HEAD WO CONTRAST (5MM)  Result Date: 08/29/2022 CLINICAL DATA:  Trauma, fall EXAM: CT HEAD WITHOUT CONTRAST TECHNIQUE: Contiguous axial images were obtained from the base of the skull through the vertex without intravenous contrast. RADIATION DOSE REDUCTION: This exam was performed according to the departmental dose-optimization program which includes automated exposure control, adjustment of the mA and/or kV according to patient size and/or use of iterative reconstruction technique. COMPARISON:  12/05/2021 FINDINGS: Brain: No acute intracranial findings are seen in noncontrast CT brain. There are no signs of bleeding within the cranium. Cortical sulci are prominent. There is decreased density in periventricular and subcortical white matter. There is 1.7 cm dense calcification inseparable from the anterior falx, possibly meningioma with no significant interval change. There is no adjacent edema. Vascular: There are scattered arterial calcifications. Skull: No acute findings are seen. Sinuses/Orbits: There is mild mucosal thickening in ethmoid and sphenoid sinuses. Other: None. IMPRESSION: No acute intracranial findings are seen. Atrophy. Small-vessel disease. There is 1.7 cm dense calcification inseparable from anterior falx suggesting meningioma with no significant interval change. Electronically Signed   By: Elmer Picker M.D.   On: 08/29/2022 15:45   DG FEMUR, MIN 2 VIEWS RIGHT  Result Date: 08/29/2022 CLINICAL DATA:  [place] from standing while at work.  Right hip pain. EXAM: RIGHT FEMUR 2 VIEWS COMPARISON:  Right hip radiographs 08/29/2022, CT abdomen and pelvis 04/17/2022 FINDINGS: Mild-to-moderate medial compartment of the knee joint space narrowing. There is subtle linear lucency overlying the right intertrochanteric region on lateral view. Mild chronic enthesopathic change at the quadriceps insertion on the patella. No joint effusion. Mild right  femoroacetabular joint space narrowing and peripheral acetabular degenerative osteophytosis. There are dense vascular calcifications. IMPRESSION: There is craniocaudal linear lucency overlying the superior intertrochanteric region on lateral view only. This could represent an overlying skin marking. It is difficult to exclude an acute nondisplaced fracture in this region. Electronically Signed   By: Yvonne Kendall M.D.   On: 08/29/2022 15:27   DG Tibia/Fibula Right  Result Date: 08/29/2022 CLINICAL DATA:  Trauma, fall EXAM: RIGHT TIBIA AND FIBULA - 2 VIEW COMPARISON:  None Available. FINDINGS: No recent fracture or dislocation is seen. Osteopenia is seen in bony structures. There is soft tissue swelling around the ankle. There is edema in subcutaneous plane in the lower leg. IMPRESSION: No fracture or dislocation is seen in right tibia and fibula. Electronically Signed   By: Elmer Picker M.D.   On: 08/29/2022 15:24   DG Chest 1 View  Result Date: 08/29/2022 CLINICAL DATA:  Fall.  RIGHT-sided severe pain EXAM: CHEST  1 VIEW COMPARISON:  04/16/2022 FINDINGS: RIGHT-sided port. Enlarged cardiac silhouette. RIGHT pleural effusion. No pneumothorax. No fracture. IMPRESSION: 1. Chronic RIGHT effusion. 2. Cardiomegaly. 3. No acute chest trauma. Electronically Signed   By: Suzy Bouchard M.D.   On: 08/29/2022 14:04   DG HIP UNILAT W OR W/O PELVIS 2-3 VIEWS RIGHT  Result Date: 08/29/2022 CLINICAL DATA:  Fall, right hip pain EXAM: DG HIP (WITH OR WITHOUT PELVIS) 2-3V RIGHT COMPARISON:  04/17/2022 FINDINGS: Bones appear diffusely demineralized. There is no evidence of hip fracture or dislocation. Joint spaces are relatively well preserved. Atherosclerotic vascular calcifications. IMPRESSION: No evidence of right hip fracture or dislocation. Electronically Signed   By: Davina Poke D.O.   On: 08/29/2022 14:02    Intake/Output    None    ROS multiple as above Blood pressure 129/76, pulse 62,  temperature (!) 97.4 F (36.3 C), temperature source Oral, resp. rate 20, height '5\' 2"'$  (1.575 m), weight 68 kg, SpO2 (!) 88 %. Physical Exam Very pleasant and alert but in substantial pain having just had her BM changed Tachycardic No audible wheezes or chest retractions RLE No knee tenderness; hip is tender  Edema/ swelling controlled  Sens: DPN, SPN, TN intact  Motor: EHL, FHL, and lessor toe ext and flex all intact grossly  Brisk cap refill, warm to touch, DP 2+ Gait: could not observe Coordination and balance: could not observe   Assessment/Plan:  Nondisplaced right hip intertrochanteric fracture for IM nailing tomorrow 0800; pain control tonight; no role for traction as is nondisplaced at this time.  Eliquis--hold tonight and resume post op Multiple medical problems including afib, IDDM, COPD, CAD    Weightbearing: WBAT RLE AFTER surgery Insicional and dressing care: OK to remove dressings 48 hrs and leave open to air with dry gauze PRN and Reinforce dressings as needed Orthopedic device(s): none Showering: yes after 48 hrs VTE prophylaxis:  resume Eliquis at prior dose   Pain control: Norco Follow - up plan: 2 weeks Contact information:  Altamese Chan[place] MD, Ainsley Spinner PA   Altamese [place], MD [facility], [facility] [phone]  08/29/2022, 7:10 PM  [facility] [place] [id number] [phone] Jenetta [phone] (F)    After 5pm and on the weekends please log on to Amion, go to orthopaedics and the look under the [facility] Call for the provider(s) on call. You can also call our office at [phone] and then follow the prompts to be connected to the call team.

## 2022-08-30 ENCOUNTER — Encounter (HOSPITAL_COMMUNITY): Payer: Self-pay | Admitting: Internal Medicine

## 2022-08-30 ENCOUNTER — Inpatient Hospital Stay (HOSPITAL_COMMUNITY): Payer: Worker's Compensation | Admitting: Anesthesiology

## 2022-08-30 ENCOUNTER — Encounter (HOSPITAL_COMMUNITY)
Admission: EM | Disposition: A | Discharge status: Rehabilitation Facility | Payer: Self-pay | Source: Home / Self Care | Attending: Internal Medicine

## 2022-08-30 ENCOUNTER — Inpatient Hospital Stay (HOSPITAL_COMMUNITY): Payer: Worker's Compensation

## 2022-08-30 ENCOUNTER — Other Ambulatory Visit: Payer: Self-pay

## 2022-08-30 DIAGNOSIS — I10 Essential (primary) hypertension: Secondary | ICD-10-CM

## 2022-08-30 DIAGNOSIS — I2510 Atherosclerotic heart disease of native coronary artery without angina pectoris: Secondary | ICD-10-CM

## 2022-08-30 DIAGNOSIS — Z87891 Personal history of nicotine dependence: Secondary | ICD-10-CM

## 2022-08-30 DIAGNOSIS — I130 Hypertensive heart and chronic kidney disease with heart failure and stage 1 through stage 4 chronic kidney disease, or unspecified chronic kidney disease: Secondary | ICD-10-CM

## 2022-08-30 DIAGNOSIS — N189 Chronic kidney disease, unspecified: Secondary | ICD-10-CM

## 2022-08-30 DIAGNOSIS — I509 Heart failure, unspecified: Secondary | ICD-10-CM | POA: Diagnosis not present

## 2022-08-30 DIAGNOSIS — M549 Dorsalgia, unspecified: Secondary | ICD-10-CM

## 2022-08-30 DIAGNOSIS — E1122 Type 2 diabetes mellitus with diabetic chronic kidney disease: Secondary | ICD-10-CM

## 2022-08-30 DIAGNOSIS — C9201 Acute myeloblastic leukemia, in remission: Secondary | ICD-10-CM

## 2022-08-30 DIAGNOSIS — N184 Chronic kidney disease, stage 4 (severe): Secondary | ICD-10-CM

## 2022-08-30 DIAGNOSIS — S72141A Displaced intertrochanteric fracture of right femur, initial encounter for closed fracture: Secondary | ICD-10-CM | POA: Diagnosis not present

## 2022-08-30 DIAGNOSIS — Z794 Long term (current) use of insulin: Secondary | ICD-10-CM

## 2022-08-30 DIAGNOSIS — S72001A Fracture of unspecified part of neck of right femur, initial encounter for closed fracture: Secondary | ICD-10-CM

## 2022-08-30 DIAGNOSIS — G8929 Other chronic pain: Secondary | ICD-10-CM

## 2022-08-30 HISTORY — PX: INTRAMEDULLARY (IM) NAIL INTERTROCHANTERIC: SHX5875

## 2022-08-30 LAB — CBC
HCT: 34.2 % — ABNORMAL LOW (ref 36.0–46.0)
Hemoglobin: 11.1 g/dL — ABNORMAL LOW (ref 12.0–15.0)
MCH: 30.7 pg (ref 26.0–34.0)
MCHC: 32.5 g/dL (ref 30.0–36.0)
MCV: 94.5 fL (ref 80.0–100.0)
Platelets: 114 10*3/uL — ABNORMAL LOW (ref 150–400)
RBC: 3.62 MIL/uL — ABNORMAL LOW (ref 3.87–5.11)
RDW: 13.3 % (ref 11.5–15.5)
WBC: 7.7 10*3/uL (ref 4.0–10.5)
nRBC: 0 % (ref 0.0–0.2)

## 2022-08-30 LAB — GLUCOSE, CAPILLARY
Glucose-Capillary: 204 mg/dL — ABNORMAL HIGH (ref 70–99)
Glucose-Capillary: 177 mg/dL — ABNORMAL HIGH (ref 70–99)
Glucose-Capillary: 113 mg/dL — ABNORMAL HIGH (ref 70–99)
Glucose-Capillary: 119 mg/dL — ABNORMAL HIGH (ref 70–99)
Glucose-Capillary: 156 mg/dL — ABNORMAL HIGH (ref 70–99)
Glucose-Capillary: 156 mg/dL — ABNORMAL HIGH (ref 70–99)
Glucose-Capillary: 128 mg/dL — ABNORMAL HIGH (ref 70–99)
Glucose-Capillary: 128 mg/dL — ABNORMAL HIGH (ref 70–99)

## 2022-08-30 LAB — BASIC METABOLIC PANEL
Anion gap: 11 (ref 5–15)
BUN: 50 mg/dL — ABNORMAL HIGH (ref 8–23)
CO2: 16 mmol/L — ABNORMAL LOW (ref 22–32)
Calcium: 7.7 mg/dL — ABNORMAL LOW (ref 8.9–10.3)
Chloride: 114 mmol/L — ABNORMAL HIGH (ref 98–111)
Creatinine, Ser: 2.56 mg/dL — ABNORMAL HIGH (ref 0.44–1.00)
GFR, Estimated: 18 mL/min — ABNORMAL LOW (ref >60)
Glucose, Bld: 198 mg/dL — ABNORMAL HIGH (ref 70–99)
Potassium: 3.7 mmol/L (ref 3.5–5.1)
Sodium: 141 mmol/L (ref 135–145)

## 2022-08-30 LAB — MRSA NEXT GEN BY PCR, NASAL: MRSA by PCR Next Gen: NOT DETECTED

## 2022-08-30 LAB — VITAMIN D 25 HYDROXY (VIT D DEFICIENCY, FRACTURES): Vit D, 25-Hydroxy: 4.68 ng/mL — ABNORMAL LOW (ref 30–100)

## 2022-08-30 SURGERY — FIXATION, FRACTURE, INTERTROCHANTERIC, WITH INTRAMEDULLARY ROD
Anesthesia: General | Laterality: Right

## 2022-08-30 MED ORDER — ORAL CARE MOUTH RINSE: 15.0000 mL | Freq: Once | OROMUCOSAL | Status: AC

## 2022-08-30 MED ORDER — ACETAMINOPHEN 10 MG/ML IV SOLN: 1000.0000 mg | Freq: Once | INTRAVENOUS | Status: DC | PRN

## 2022-08-30 MED ORDER — FENTANYL CITRATE (PF) 100 MCG/2ML IJ SOLN
25.0000 ug | INTRAMUSCULAR | Status: DC | PRN
  Administered 2022-08-30: 50 ug via INTRAVENOUS

## 2022-08-30 MED ORDER — FENTANYL CITRATE (PF) 100 MCG/2ML IJ SOLN
INTRAMUSCULAR | Status: AC
  Filled 2022-08-30: qty 2

## 2022-08-30 MED ORDER — GLYCOPYRROLATE PF 0.2 MG/ML IJ SOSY
PREFILLED_SYRINGE | INTRAMUSCULAR | Status: AC
  Filled 2022-08-30: qty 1

## 2022-08-30 MED ORDER — DEXAMETHASONE SODIUM PHOSPHATE 10 MG/ML IJ SOLN
INTRAMUSCULAR | Status: AC
  Filled 2022-08-30: qty 1

## 2022-08-30 MED ORDER — CHLORHEXIDINE GLUCONATE 0.12 % MT SOLN: 15.0000 mL | Freq: Once | OROMUCOSAL | Status: AC

## 2022-08-30 MED ORDER — ONDANSETRON HCL 4 MG/2ML IJ SOLN: 4.0000 mg | Freq: Four times a day (QID) | INTRAMUSCULAR | Status: DC | PRN

## 2022-08-30 MED ORDER — ALBUMIN HUMAN 5 % IV SOLN: INTRAVENOUS | Status: DC | PRN

## 2022-08-30 MED ORDER — VITAMIN D (ERGOCALCIFEROL) 1.25 MG (50000 UNIT) PO CAPS
50000.0000 [IU] | ORAL_CAPSULE | ORAL | Status: DC
  Administered 2022-08-30: 50000 [IU] via ORAL
  Filled 2022-08-30: qty 1

## 2022-08-30 MED ORDER — FENTANYL CITRATE (PF) 100 MCG/2ML IJ SOLN: 25.0000 ug | INTRAMUSCULAR | Status: DC | PRN

## 2022-08-30 MED ORDER — ONDANSETRON HCL 4 MG PO TABS: 4.0000 mg | ORAL_TABLET | Freq: Four times a day (QID) | ORAL | Status: DC | PRN

## 2022-08-30 MED ORDER — EPHEDRINE SULFATE-NACL 50-0.9 MG/10ML-% IV SOSY
PREFILLED_SYRINGE | INTRAVENOUS | Status: DC | PRN
  Administered 2022-08-30: 5 mg via INTRAVENOUS

## 2022-08-30 MED ORDER — INSULIN ASPART 100 UNIT/ML IJ SOLN: 0.0000 [IU] | INTRAMUSCULAR | Status: DC | PRN

## 2022-08-30 MED ORDER — CHLORHEXIDINE GLUCONATE CLOTH 2 % EX PADS
6.0000 | MEDICATED_PAD | Freq: Every day | CUTANEOUS | Status: DC
  Administered 2022-08-30 – 2022-09-04 (×6): 6 via TOPICAL

## 2022-08-30 MED ORDER — 0.9 % SODIUM CHLORIDE (POUR BTL) OPTIME
TOPICAL | Status: DC | PRN
  Administered 2022-08-30: 1000 mL

## 2022-08-30 MED ORDER — GLYCOPYRROLATE PF 0.2 MG/ML IJ SOSY
PREFILLED_SYRINGE | INTRAMUSCULAR | Status: DC | PRN
  Administered 2022-08-30: .2 mg via INTRAVENOUS

## 2022-08-30 MED ORDER — PROPOFOL 10 MG/ML IV BOLUS
INTRAVENOUS | Status: AC
  Filled 2022-08-30: qty 20

## 2022-08-30 MED ORDER — PHENYLEPHRINE HCL (PRESSORS) 10 MG/ML IV SOLN
INTRAVENOUS | Status: DC | PRN
  Administered 2022-08-30: 120 ug via INTRAVENOUS
  Administered 2022-08-30: 160 ug via INTRAVENOUS
  Administered 2022-08-30: 80 ug via INTRAVENOUS

## 2022-08-30 MED ORDER — ALBUTEROL SULFATE HFA 108 (90 BASE) MCG/ACT IN AERS
INHALATION_SPRAY | RESPIRATORY_TRACT | Status: DC | PRN
  Administered 2022-08-30: 3 via RESPIRATORY_TRACT

## 2022-08-30 MED ORDER — PHENYLEPHRINE 80 MCG/ML (10ML) SYRINGE FOR IV PUSH (FOR BLOOD PRESSURE SUPPORT)
PREFILLED_SYRINGE | INTRAVENOUS | Status: AC
  Filled 2022-08-30: qty 10

## 2022-08-30 MED ORDER — CEFAZOLIN SODIUM-DEXTROSE 2-3 GM-%(50ML) IV SOLR
INTRAVENOUS | Status: DC | PRN
  Administered 2022-08-30: 2 g via INTRAVENOUS

## 2022-08-30 MED ORDER — ADULT MULTIVITAMIN W/MINERALS CH
1.0000 | ORAL_TABLET | Freq: Every day | ORAL | Status: DC
  Administered 2022-08-30 – 2022-09-05 (×7): 1 via ORAL
  Filled 2022-08-30 (×7): qty 1

## 2022-08-30 MED ORDER — MENTHOL 3 MG MT LOZG: 1.0000 | LOZENGE | OROMUCOSAL | Status: DC | PRN

## 2022-08-30 MED ORDER — PROPOFOL 10 MG/ML IV BOLUS
INTRAVENOUS | Status: DC | PRN
  Administered 2022-08-30: 60 mg via INTRAVENOUS

## 2022-08-30 MED ORDER — FENTANYL CITRATE (PF) 250 MCG/5ML IJ SOLN
INTRAMUSCULAR | Status: DC | PRN
  Administered 2022-08-30 (×2): 50 ug via INTRAVENOUS

## 2022-08-30 MED ORDER — ROCURONIUM BROMIDE 10 MG/ML (PF) SYRINGE
PREFILLED_SYRINGE | INTRAVENOUS | Status: DC | PRN
  Administered 2022-08-30: 50 mg via INTRAVENOUS

## 2022-08-30 MED ORDER — ONDANSETRON HCL 4 MG/2ML IJ SOLN
INTRAMUSCULAR | Status: DC | PRN
  Administered 2022-08-30: 4 mg via INTRAVENOUS

## 2022-08-30 MED ORDER — METOCLOPRAMIDE HCL 5 MG/ML IJ SOLN: 5.0000 mg | Freq: Three times a day (TID) | INTRAMUSCULAR | Status: DC | PRN

## 2022-08-30 MED ORDER — PHENYLEPHRINE HCL-NACL 20-0.9 MG/250ML-% IV SOLN
INTRAVENOUS | Status: DC | PRN
  Administered 2022-08-30: 50 ug/min via INTRAVENOUS

## 2022-08-30 MED ORDER — EPHEDRINE 5 MG/ML INJ
INTRAVENOUS | Status: AC
  Filled 2022-08-30: qty 5

## 2022-08-30 MED ORDER — PHENOL 1.4 % MT LIQD: 1.0000 | OROMUCOSAL | Status: DC | PRN

## 2022-08-30 MED ORDER — ROCURONIUM BROMIDE 10 MG/ML (PF) SYRINGE
PREFILLED_SYRINGE | INTRAVENOUS | Status: AC
  Filled 2022-08-30: qty 10

## 2022-08-30 MED ORDER — METOCLOPRAMIDE HCL 5 MG PO TABS: 5.0000 mg | ORAL_TABLET | Freq: Three times a day (TID) | ORAL | Status: DC | PRN

## 2022-08-30 MED ORDER — ALUM & MAG HYDROXIDE-SIMETH 200-200-20 MG/5ML PO SUSP
15.0000 mL | Freq: Once | ORAL | Status: DC | PRN
  Filled 2022-08-30: qty 30

## 2022-08-30 MED ORDER — LIDOCAINE 2% (20 MG/ML) 5 ML SYRINGE
INTRAMUSCULAR | Status: DC | PRN
  Administered 2022-08-30: 60 mg via INTRAVENOUS

## 2022-08-30 MED ORDER — SUGAMMADEX SODIUM 200 MG/2ML IV SOLN
INTRAVENOUS | Status: DC | PRN
  Administered 2022-08-30: 300 mg via INTRAVENOUS

## 2022-08-30 MED ORDER — ALBUTEROL SULFATE HFA 108 (90 BASE) MCG/ACT IN AERS
INHALATION_SPRAY | RESPIRATORY_TRACT | Status: AC
  Filled 2022-08-30: qty 6.7

## 2022-08-30 MED ORDER — CHLORHEXIDINE GLUCONATE 0.12 % MT SOLN
OROMUCOSAL | Status: AC
  Filled 2022-08-30: qty 15

## 2022-08-30 MED ORDER — LIDOCAINE 2% (20 MG/ML) 5 ML SYRINGE
INTRAMUSCULAR | Status: AC
  Filled 2022-08-30: qty 5

## 2022-08-30 MED ORDER — CEFAZOLIN SODIUM-DEXTROSE 2-4 GM/100ML-% IV SOLN
2.0000 g | Freq: Four times a day (QID) | INTRAVENOUS | Status: AC
  Administered 2022-08-30 (×2): 2 g via INTRAVENOUS
  Filled 2022-08-30 (×2): qty 100

## 2022-08-30 MED ORDER — TRANEXAMIC ACID-NACL 1000-0.7 MG/100ML-% IV SOLN
1000.0000 mg | Freq: Once | INTRAVENOUS | Status: AC
  Administered 2022-08-30: 1000 mg via INTRAVENOUS
  Filled 2022-08-30: qty 100

## 2022-08-30 MED ORDER — DEXAMETHASONE SODIUM PHOSPHATE 10 MG/ML IJ SOLN
INTRAMUSCULAR | Status: DC | PRN
  Administered 2022-08-30: 5 mg via INTRAVENOUS

## 2022-08-30 MED ORDER — ACETAMINOPHEN 500 MG PO TABS
1000.0000 mg | ORAL_TABLET | Freq: Three times a day (TID) | ORAL | Status: DC
  Administered 2022-08-30 – 2022-09-05 (×18): 1000 mg via ORAL
  Filled 2022-08-30 (×19): qty 2

## 2022-08-30 MED ORDER — SODIUM CHLORIDE 0.9 % IV SOLN: INTRAVENOUS | Status: DC

## 2022-08-30 MED ORDER — ONDANSETRON HCL 4 MG/2ML IJ SOLN
INTRAMUSCULAR | Status: AC
  Filled 2022-08-30: qty 2

## 2022-08-30 MED ORDER — FENTANYL CITRATE (PF) 250 MCG/5ML IJ SOLN
INTRAMUSCULAR | Status: AC
  Filled 2022-08-30: qty 5

## 2022-08-30 MED ORDER — DOCUSATE SODIUM 100 MG PO CAPS
100.0000 mg | ORAL_CAPSULE | Freq: Two times a day (BID) | ORAL | Status: DC
  Administered 2022-08-30 – 2022-09-05 (×13): 100 mg via ORAL
  Filled 2022-08-30 (×13): qty 1

## 2022-08-30 MED ORDER — GLUCERNA SHAKE PO LIQD
237.0000 mL | Freq: Two times a day (BID) | ORAL | Status: DC
  Administered 2022-08-30 – 2022-09-04 (×5): 237 mL via ORAL

## 2022-08-30 SURGICAL SUPPLY — 48 items
BAG COUNTER SPONGE SURGICOUNT (BAG) ×2 IMPLANT
BAG SPNG CNTER NS LX DISP (BAG) ×1
BIT DRILL CANN LG 4.3MM (BIT) IMPLANT
BNDG COHESIVE 6X5 TAN STRL LF (GAUZE/BANDAGES/DRESSINGS) IMPLANT
BRUSH SCRUB EZ PLAIN DRY (MISCELLANEOUS) ×4 IMPLANT
COVER SURGICAL LIGHT HANDLE (MISCELLANEOUS) ×4 IMPLANT
DRAPE C-ARMOR (DRAPES) ×2 IMPLANT
DRAPE HALF SHEET 40X57 (DRAPES) IMPLANT
DRAPE INCISE IOBAN 66X45 STRL (DRAPES) ×2 IMPLANT
DRAPE ORTHO SPLIT 77X108 STRL (DRAPES) ×2
DRAPE SURG ORHT 6 SPLT 77X108 (DRAPES) IMPLANT
DRAPE U-SHAPE 47X51 STRL (DRAPES) ×2 IMPLANT
DRESSING MEPILEX FLEX 4X4 (GAUZE/BANDAGES/DRESSINGS) IMPLANT
DRILL BIT CANN LG 4.3MM (BIT) ×1
DRSG EMULSION OIL 3X3 NADH (GAUZE/BANDAGES/DRESSINGS) ×2 IMPLANT
DRSG MEPILEX BORDER 4X4 (GAUZE/BANDAGES/DRESSINGS) ×2 IMPLANT
DRSG MEPILEX BORDER 4X8 (GAUZE/BANDAGES/DRESSINGS) ×2 IMPLANT
DRSG MEPILEX FLEX 4X4 (GAUZE/BANDAGES/DRESSINGS) ×2
ELECT REM PT RETURN 9FT ADLT (ELECTROSURGICAL) ×1
ELECTRODE REM PT RTRN 9FT ADLT (ELECTROSURGICAL) ×2 IMPLANT
GLOVE BIO SURGEON STRL SZ8 (GLOVE) ×2 IMPLANT
GLOVE BIOGEL PI IND STRL 8 (GLOVE) ×2 IMPLANT
GLOVE SURG ORTHO LTX SZ7.5 (GLOVE) ×4 IMPLANT
GOWN STRL REUS W/ TWL LRG LVL3 (GOWN DISPOSABLE) ×4 IMPLANT
GOWN STRL REUS W/ TWL XL LVL3 (GOWN DISPOSABLE) ×2 IMPLANT
GOWN STRL REUS W/TWL LRG LVL3 (GOWN DISPOSABLE) ×1
GOWN STRL REUS W/TWL XL LVL3 (GOWN DISPOSABLE) ×1
GUIDEPIN VERSANAIL DSP 3.2X444 (ORTHOPEDIC DISPOSABLE SUPPLIES) IMPLANT
GUIDEWIRE BALL NOSE 100CM (WIRE) IMPLANT
HFN LAG SCREW 10.5MM X 85MM (Screw) IMPLANT
KIT BASIN OR (CUSTOM PROCEDURE TRAY) ×2 IMPLANT
KIT TURNOVER KIT B (KITS) ×2 IMPLANT
NAIL HIP FRACT 130D 11X180 (Screw) IMPLANT
NS IRRIG 1000ML POUR BTL (IV SOLUTION) ×2 IMPLANT
PACK GENERAL/GYN (CUSTOM PROCEDURE TRAY) ×2 IMPLANT
PAD ARMBOARD 7.5X6 YLW CONV (MISCELLANEOUS) ×4 IMPLANT
SCREW BONE CORTICAL 5.0X36 (Screw) IMPLANT
STAPLER VISISTAT 35W (STAPLE) ×2 IMPLANT
STOCKINETTE IMPERVIOUS LG (DRAPES) IMPLANT
SUT ETHILON 2 0 FS 18 (SUTURE) ×2 IMPLANT
SUT VIC AB 0 CT1 27 (SUTURE) ×1
SUT VIC AB 0 CT1 27XBRD ANBCTR (SUTURE) ×2 IMPLANT
SUT VIC AB 1 CT1 27 (SUTURE) ×1
SUT VIC AB 1 CT1 27XBRD ANBCTR (SUTURE) ×2 IMPLANT
SUT VIC AB 2-0 CT1 27 (SUTURE) ×1
SUT VIC AB 2-0 CT1 TAPERPNT 27 (SUTURE) ×2 IMPLANT
TOWEL GREEN STERILE (TOWEL DISPOSABLE) ×4 IMPLANT
WATER STERILE IRR 1000ML POUR (IV SOLUTION) ×2 IMPLANT

## 2022-08-30 NOTE — Progress Notes (Signed)
PROGRESS NOTE    [name]  DGU:[id number] DOB: [birth year] DOA: 08/29/2022 PCP: Isaac Bliss, Rayford Halsted, MD   Brief Narrative:  81 y.o. female with medical history significant of AML (remote); afib on Eliquis; chronic back pain; COPD; CAD; DM; HTN; HLD; chronic diastolic CHF; uterine/breast cancer; and CAD presented with a fall and was found to have right hip fracture.  CT of the head was negative for acute intracranial abnormity.  Orthopedics was consulted.  Assessment & Plan:   Right hip fracture after mechanical fall -Orthopedics following and planning for surgical intervention today.  Follow further orthopedics recommendations.  Continue pain management. -Fall precautions  Persistent A-fib -Mild intermittent bradycardia noted.  Eliquis on hold.  Resume Eliquis once cleared by orthopedics  Chronic diastolic CHF -Echo on 9/56/3875 had shown EF of 60 to 65%.  Currently showing no signs of volume overload.  Strict input and output.  Daily weights. -Losartan and Jardiance on hold.  Outpatient follow-up with cardiology  History of AML in remission and recent breast cancer -Outpatient follow-up with oncology/Dr. [name].  Letrozole on hold for now.  Seizure disorder -Continue Keppra  Acute kidney injury on chronic kidney disease stage IV Acute metabolic acidosis -Baseline creatinine around 1.5-1.9.  Creatinine 2.56 this morning.  Start gentle hydration.  Repeat a.m. labs  Anemia of chronic disease--possibly from renal failure.  Hemoglobin stable.  Monitor intermittently  Thrombocytopenia -Questionable cause.  Monitor intermittently.  No signs of bleeding currently.  Mellitus type II with hyperglycemia -Continue long-acting insulin along with CBGs with SSI  Hyperlipidemia Continue statin  Hypertension -Monitor blood pressure.  Continue Imdur.  Amlodipine and metoprolol on hold for now  CAD -Status post history of stenting -no chest pain. -Continue Imdur.  Resume  Plavix postprocedure  COPD -Stable.  Continue albuterol as needed  Chronic pain -Continue current pain management.  Outpatient follow-up with PCP/pain management  DVT prophylaxis: SCDs Code Status: Full Family Communication: None at bedside Disposition Plan: Status is: Inpatient Remains inpatient appropriate because: Of severity of illness  Consultants: Orthopedics  Procedures: None  Antimicrobials: Perioperative   Subjective: Patient seen and examined at bedside.  Complains of intermittent right hip pain.  No overnight fever, vomiting, seizures reported.  Objective: Vitals:   08/29/22 2333 08/30/22 0322 08/30/22 0734 08/30/22 0753  BP: (!) 116/51 119/62 128/67   Pulse: 66 60 (!) 47   Resp: 17 19 (!) 8   Temp: 97.6 F (36.4 C) 98 F (36.7 C) 98 F (36.7 C)   TempSrc: Oral Oral Oral   SpO2: 93% 93% 96%   Weight:    72.5 kg  Height:    5' 2.01" (1.575 m)    Intake/Output Summary (Last 24 hours) at 08/30/2022 1003 Last data filed at 08/30/2022 0945 Gross per 24 hour  Intake 250 ml  Output 25 ml  Net 225 ml   Filed Weights   08/29/22 1259 08/29/22 2039 08/30/22 0753  Weight: 68 kg 72.5 kg 72.5 kg    Examination:  General exam: Appears calm and comfortable.  Currently on room air. Respiratory system: Bilateral decreased breath sounds at bases with some scattered crackles Cardiovascular system: S1 & S2 heard, intermittent bradycardia present  gastrointestinal system: Abdomen is nondistended, soft and nontender. Normal bowel sounds heard. Extremities: No cyanosis, clubbing, edema  Central nervous system: Alert and oriented.  Slow to respond.  No focal neurological deficits. Moving extremities Skin: No rashes, lesions or ulcers Psychiatry: Very flat.  No signs of agitation.  Data Reviewed: I have personally reviewed following labs and imaging studies  CBC: Recent Labs  Lab 08/29/22 1422 08/30/22 0400  WBC 9.5 7.7  HGB 11.1* 11.1*  HCT 33.6* 34.2*  MCV  92.3 94.5  PLT 120* 335*   Basic Metabolic Panel: Recent Labs  Lab 08/29/22 1422 08/30/22 0400  NA 140 141  K 3.5 3.7  CL 116* 114*  CO2 16* 16*  GLUCOSE 242* 198*  BUN 49* 50*  CREATININE 2.41* 2.56*  CALCIUM 7.3* 7.7*   GFR: Estimated Creatinine Clearance: 16.4 mL/min (A) (by C-G formula based on SCr of 2.56 mg/dL (H)). Liver Function Tests: No results for input(s): "AST", "ALT", "ALKPHOS", "BILITOT", "PROT", "ALBUMIN" in the last 168 hours. No results for input(s): "LIPASE", "AMYLASE" in the last 168 hours. No results for input(s): "AMMONIA" in the last 168 hours. Coagulation Profile: No results for input(s): "INR", "PROTIME" in the last 168 hours. Cardiac Enzymes: No results for input(s): "CKTOTAL", "CKMB", "CKMBINDEX", "TROPONINI" in the last 168 hours. BNP (last 3 results) No results for input(s): "PROBNP" in the last 8760 hours. HbA1C: No results for input(s): "HGBA1C" in the last 72 hours. CBG: Recent Labs  Lab 08/29/22 2115 08/29/22 2338 08/30/22 0320 08/30/22 0548  GLUCAP 195* 211* 204* 177*   Lipid Profile: No results for input(s): "CHOL", "HDL", "LDLCALC", "TRIG", "CHOLHDL", "LDLDIRECT" in the last 72 hours. Thyroid Function Tests: No results for input(s): "TSH", "T4TOTAL", "FREET4", "T3FREE", "THYROIDAB" in the last 72 hours. Anemia Panel: No results for input(s): "VITAMINB12", "FOLATE", "FERRITIN", "TIBC", "IRON", "RETICCTPCT" in the last 72 hours. Sepsis Labs: No results for input(s): "PROCALCITON", "LATICACIDVEN" in the last 168 hours.  Recent Results (from the past 240 hour(s))  MRSA Next Gen by PCR, Nasal     Status: None   Collection Time: 08/29/22  9:30 PM   Specimen: Nasal Mucosa; Nasal Swab  Result Value Ref Range Status   MRSA by PCR Next Gen NOT DETECTED NOT DETECTED Final    Comment: (NOTE) The GeneXpert MRSA Assay (FDA approved for NASAL specimens only), is one component of a comprehensive MRSA colonization surveillance program. It  is not intended to diagnose MRSA infection nor to guide or monitor treatment for MRSA infections. Test performance is not FDA approved in patients less than 101 years old. Performed at [facility], [place] [address] [place], [place] [address]          Radiology Studies: CT Hip Right Wo Contrast  Result Date: 08/29/2022 CLINICAL DATA:  Hip trauma, fracture suspected, xray done r/o occult hip or pelvic/pubic rami fx EXAM: CT OF THE RIGHT HIP WITHOUT CONTRAST TECHNIQUE: Multidetector CT imaging of the right hip was performed according to the standard protocol. Multiplanar CT image reconstructions were also generated. RADIATION DOSE REDUCTION: This exam was performed according to the departmental dose-optimization program which includes automated exposure control, adjustment of the mA and/or kV according to patient size and/or use of iterative reconstruction technique. COMPARISON:  Same day radiographs FINDINGS: Bones/Joint/Cartilage There is a nondisplaced fracture of the intertrochanteric right proximal femur, and comminuted involvement of the greater trochanter. Unchanged diffuse mixed sclerosis and lucency within visible pelvis. Ligaments Suboptimally assessed by CT. Muscles and Tendons No acute myotendinous abnormality by CT. Soft tissues No focal fluid collection.  Soft tissue swelling along the hip. IMPRESSION: Nondisplaced intertrochanteric fracture of the right proximal femur, with comminuted involvement of the greater trochanter. Unchanged diffuse mixed sclerosis and lucency within the visible pelvis, suggestive of metastatic disease, as noted on  prior exams. Electronically Signed   By: Maurine Simmering M.D.   On: 08/29/2022 15:56   CT HEAD WO CONTRAST (5MM)  Result Date: 08/29/2022 CLINICAL DATA:  Trauma, fall EXAM: CT HEAD WITHOUT CONTRAST TECHNIQUE: Contiguous axial images were obtained from the base of the skull through the vertex without intravenous contrast. RADIATION DOSE REDUCTION:  This exam was performed according to the departmental dose-optimization program which includes automated exposure control, adjustment of the mA and/or kV according to patient size and/or use of iterative reconstruction technique. COMPARISON:  12/05/2021 FINDINGS: Brain: No acute intracranial findings are seen in noncontrast CT brain. There are no signs of bleeding within the cranium. Cortical sulci are prominent. There is decreased density in periventricular and subcortical white matter. There is 1.7 cm dense calcification inseparable from the anterior falx, possibly meningioma with no significant interval change. There is no adjacent edema. Vascular: There are scattered arterial calcifications. Skull: No acute findings are seen. Sinuses/Orbits: There is mild mucosal thickening in ethmoid and sphenoid sinuses. Other: None. IMPRESSION: No acute intracranial findings are seen. Atrophy. Small-vessel disease. There is 1.7 cm dense calcification inseparable from anterior falx suggesting meningioma with no significant interval change. Electronically Signed   By: Elmer Picker M.D.   On: 08/29/2022 15:45   DG FEMUR, MIN 2 VIEWS RIGHT  Result Date: 08/29/2022 CLINICAL DATA:  [place] [address] from standing while at work.  Right hip pain. EXAM: RIGHT FEMUR 2 VIEWS COMPARISON:  Right hip radiographs 08/29/2022, CT abdomen and pelvis 04/17/2022 FINDINGS: Mild-to-moderate medial compartment of the knee joint space narrowing. There is subtle linear lucency overlying the right intertrochanteric region on lateral view. Mild chronic enthesopathic change at the quadriceps insertion on the patella. No joint effusion. Mild right femoroacetabular joint space narrowing and peripheral acetabular degenerative osteophytosis. There are dense vascular calcifications. IMPRESSION: There is craniocaudal linear lucency overlying the superior intertrochanteric region on lateral view only. This could represent an overlying skin marking. It is  difficult to exclude an acute nondisplaced fracture in this region. Electronically Signed   By: Yvonne Kendall M.D.   On: 08/29/2022 15:27   DG Tibia/Fibula Right  Result Date: 08/29/2022 CLINICAL DATA:  Trauma, fall EXAM: RIGHT TIBIA AND FIBULA - 2 VIEW COMPARISON:  None Available. FINDINGS: No recent fracture or dislocation is seen. Osteopenia is seen in bony structures. There is soft tissue swelling around the ankle. There is edema in subcutaneous plane in the lower leg. IMPRESSION: No fracture or dislocation is seen in right tibia and fibula. Electronically Signed   By: Elmer Picker M.D.   On: 08/29/2022 15:24   DG Chest 1 View  Result Date: 08/29/2022 CLINICAL DATA:  Fall.  RIGHT-sided severe pain EXAM: CHEST  1 VIEW COMPARISON:  04/16/2022 FINDINGS: RIGHT-sided port. Enlarged cardiac silhouette. RIGHT pleural effusion. No pneumothorax. No fracture. IMPRESSION: 1. Chronic RIGHT effusion. 2. Cardiomegaly. 3. No acute chest trauma. Electronically Signed   By: Suzy Bouchard M.D.   On: 08/29/2022 14:04   DG HIP UNILAT W OR W/O PELVIS 2-3 VIEWS RIGHT  Result Date: 08/29/2022 CLINICAL DATA:  Fall, right hip pain EXAM: DG HIP (WITH OR WITHOUT PELVIS) 2-3V RIGHT COMPARISON:  04/17/2022 FINDINGS: Bones appear diffusely demineralized. There is no evidence of hip fracture or dislocation. Joint spaces are relatively well preserved. Atherosclerotic vascular calcifications. IMPRESSION: No evidence of right hip fracture or dislocation. Electronically Signed   By: Davina Poke D.O.   On: 08/29/2022 14:02        Scheduled Meds:  [  MAR Hold] atorvastatin  40 mg Oral Daily   [MAR Hold] Chlorhexidine Gluconate Cloth  6 each Topical Q0600   [MAR Hold] docusate sodium  100 mg Oral BID   influenza vaccine adjuvanted  0.5 mL Intramuscular Tomorrow-1000   [MAR Hold] insulin aspart  0-15 Units Subcutaneous TID WC   [MAR Hold] insulin aspart  0-5 Units Subcutaneous QHS   [MAR Hold] insulin  glargine-yfgn  12 Units Subcutaneous QHS   [MAR Hold] isosorbide mononitrate  45 mg Oral Daily   [MAR Hold] levETIRAcetam  500 mg Oral BID   Continuous Infusions:  sodium chloride     sodium chloride 200 mL/hr at 08/30/22 0941   [MAR Hold]  ceFAZolin (ANCEF) IV     [MAR Hold] methocarbamol (ROBAXIN) IV            Aline August, MD [facility] 08/30/2022, 10:03 AM

## 2022-08-30 NOTE — Progress Notes (Signed)
CSW acknowledges consult for SNF/HH. The patient will require PT/OT evaluations. TOC will assist with disposition planning once the evaluations have been completed.  °  °TOC will continue to follow.    °

## 2022-08-30 NOTE — Progress Notes (Signed)
Initial Nutrition Assessment  DOCUMENTATION CODES:   Not applicable  INTERVENTION:  Continue current diet as ordered Glucerna Shake po BID, each supplement provides 220 kcal and 10 grams of protein MVI with minerals daily Replace vitamin d: 50,000 IU 1x/week x 8 weeks  NUTRITION DIAGNOSIS:   Increased nutrient needs related to post-op healing as evidenced by estimated needs.  GOAL:   Patient will meet greater than or equal to 90% of their needs  MONITOR:   PO intake, Weight trends, Skin, Labs, Supplement acceptance  REASON FOR ASSESSMENT:   Consult Hip fracture protocol  ASSESSMENT:   Pt with hx of HTN, HLD, CAD, CKD, COPD, DM type 2, hx NSTEMI, and hx of uterine cancer and leukemia, presented to ED after a fall at work earlier in the day. Imaging showed a right hip fx.  Pt out of room in OR at the time of assessment. No intake recorded this admission but weight appears stable over the last 6 months.  Vitamin D labs drawn by Ortho and resulted extremely low. Will order replacement and follow-up with pt for hx and physical exam.  Nutritionally Relevant Medications: Scheduled Meds:  atorvastatin  40 mg Oral Daily   docusate sodium  100 mg Oral BID   insulin aspart  0-15 Units Subcutaneous TID WC   insulin aspart  0-5 Units Subcutaneous QHS   insulin glargine-yfgn  12 Units Subcutaneous QHS   Continuous Infusions:  sodium chloride 75 mL/hr at 08/30/22 1500   PRN Meds: bisacodyl, metoCLOPramide, ondansetron, polyethylene glycol  Labs Reviewed: BUN 50, creatinine 2.56 CBG ranges from 113-204 mg/dL over the last 24 hours Vitamin D 4.68  NUTRITION - FOCUSED PHYSICAL EXAM: Defer to in-person assessment  Diet Order:   Diet Order             Diet Carb Modified Fluid consistency: Thin; Room service appropriate? Yes  Diet effective now                   EDUCATION NEEDS:   No education needs have been identified at this time  Skin:  Skin Assessment:  Reviewed RN Assessment  Last BM:  pror to admission  Height:   Ht Readings from Last 1 Encounters:  08/30/22 5' 2.01" (1.575 m)    Weight:   Wt Readings from Last 1 Encounters:  08/30/22 72.5 kg    Ideal Body Weight:  50 kg  BMI:  Body mass index is 29.23 kg/m.  Estimated Nutritional Needs:   Kcal:  1600-1800 kcal/d  Protein:  80-100g/d  Fluid:  >/=1.6L/d    Ranell Patrick, RD, LDN Clinical Dietitian RD pager # available in [facility]  After hours/weekend pager # available in [employer]

## 2022-08-30 NOTE — Anesthesia Procedure Notes (Signed)
Procedure Name: Intubation Date/Time: 08/30/2022 8:38 AM  Performed by: Betha Loa, CRNAPre-anesthesia Checklist: Patient identified, Emergency Drugs available, Suction available and Patient being monitored Patient Re-evaluated:Patient Re-evaluated prior to induction Oxygen Delivery Method: Circle System Utilized Preoxygenation: Pre-oxygenation with 100% oxygen Induction Type: IV induction Ventilation: Mask ventilation without difficulty Laryngoscope Size: Mac and 3 Grade View: Grade I Tube type: Oral Tube size: 7.0 mm Number of attempts: 1 Airway Equipment and Method: Stylet and Oral airway Placement Confirmation: ETT inserted through vocal cords under direct vision, positive ETCO2 and breath sounds checked- equal and bilateral Secured at: 20 cm Tube secured with: Tape Dental Injury: Teeth and Oropharynx as per pre-operative assessment

## 2022-08-30 NOTE — Anesthesia Postprocedure Evaluation (Signed)
Anesthesia Post Note  Patient: [name]  Procedure(s) Performed: INTRAMEDULLARY (IM) NAIL INTERTROCHANTERIC (Right)     Patient location during evaluation: PACU Anesthesia Type: General Level of consciousness: awake and alert Pain management: pain level controlled Vital Signs Assessment: post-procedure vital signs reviewed and stable Respiratory status: spontaneous breathing, nonlabored ventilation, respiratory function stable and patient connected to nasal cannula oxygen Cardiovascular status: blood pressure returned to baseline and stable Postop Assessment: no apparent nausea or vomiting Anesthetic complications: no   No notable events documented.  Last Vitals:  Vitals:   08/30/22 1900 08/30/22 1939  BP: (!) 111/56   Pulse: 65   Resp: 13   Temp: 36.8 C 36.8 C  SpO2: 92%     Last Pain:  Vitals:   08/30/22 1939  TempSrc: Oral  PainSc:                  March Rummage Andrue Dini

## 2022-08-30 NOTE — Transfer of Care (Signed)
Immediate Anesthesia Transfer of Care Note  Patient: [name]  Procedure(s) Performed: INTRAMEDULLARY (IM) NAIL INTERTROCHANTERIC (Right)  Patient Location: PACU  Anesthesia Type:General  Level of Consciousness: awake, alert , oriented, patient cooperative and responds to stimulation  Airway & Oxygen Therapy: Patient Spontanous Breathing and Patient connected to nasal cannula oxygen  Post-op Assessment: Report given to RN and Post -op Vital signs reviewed and stable  Post vital signs: Reviewed and stable  Last Vitals:  Vitals Value Taken Time  BP    Temp    Pulse 74 08/30/22 1008  Resp 14 08/30/22 1008  SpO2 92 % 08/30/22 1008  Vitals shown include unvalidated device data.  Last Pain:  Vitals:   08/30/22 0734  TempSrc: Oral  PainSc:          Complications: No notable events documented.

## 2022-08-30 NOTE — Op Note (Signed)
08/30/2022  10:16 AM  PATIENT:  [name]  [birth year] female   MEDICAL RECORD NUMBER: [id number]  PRE-OPERATIVE DIAGNOSIS:  Right Intertrochanteric Hip Fx  POST-OPERATIVE DIAGNOSIS:  Right Intertrochanteric Hip Fx  PROCEDURE:  INTRAMEDULLARY NAILING OF THE RIGHT HIP using a short 11 mm Biomet Affixus nail, statically locked.  SURGEON:  [name]. [name], M.D.  ASSISTANT:  Catherine Spinner, PA-C.  ANESTHESIA:  General.  COMPLICATIONS:  None.  ESTIMATED BLOOD LOSS:  Less than 150 mL.  DISPOSITION:  To PACU.  CONDITION:  Stable.  DELAY START OF DVT PROPHYLAXIS BECAUSE OF BLEEDING RISK: NO  BRIEF SUMMARY AND INDICATION OF PROCEDURE:  [name] is a 81 y.o. year- old with multiple medical problems.  I discussed with the patient risks and benefits of surgical treatment including the potential for malunion, nonunion, symptomatic hardware, heart attack, bleeding, stroke, neurovascular injury, bleeding, and others.  After full discussion, the patient and family wished to proceed.  BRIEF SUMMARY OF PROCEDURE:  The patient was taken to the operating room where general anesthesia was induced.  She was positioned supine on the radiolucent fracture table. Reduction was confirmed of the fractured proximal femur on both AP and lateral xray views. A thorough scrub and wash with chlorhexidine and then Betadine scrub and paint was performed.  After sterile drapes and time-out, a long instrument was used to identify the appropriate starting position under C-arm on both AP and lateral images.  A 3 cm incision was made proximal to the greater trochanter.  The curved cannulated awl was inserted just medial to the tip of the lateral trochanter and then the starting guidewire advanced into the proximal femur.  This was checked on AP and lateral views.  The starting reamer was engaged with the soft tissue protected by a sleeve.  The short 11 mm nail was then inserted to the appropriate depth. The  guidewire for the lag screw was then inserted with the appropriate anteversion to make sure it was in a center-center position.  This was measured and the lag screw placed with excellent purchase and position checked on both views.  The set screw was then engaged within the groove of the lag screw, which was allowed to telescope.  Compression was achieved with the screw.  This was followed by placement of one distal locking screw using the jig.  This was confirmed on AP and lateral images. Wounds were irrigated thoroughly, closed in a standard layered fashion. Sterile gently compressive dressings were applied.  Catherine Spinner, PA-C, assisted throughout and an assistant was necessary to control the leg as we were on a flat top table rather than traction table.  The patient was awakened from anesthesia and transported to the PACU in stable condition.  PROGNOSIS:  The patient will be weightbearing as tolerated with physical therapy beginning DVT prophylaxis as soon as deemed stable by the Primary Care Service.  She has no range of motion precautions.  We will continue to follow through at the hospital.  Anticipate follow up in the office in 2 weeks for removal of sutures and further evaluation. I have notified her daughter who is her healthcare POA, who has indicated that patient's husband will be unable to assist her at home.   [name]. [name], M.D.

## 2022-08-30 NOTE — Progress Notes (Signed)
Patient complains of indigestion and is asking if there is a medication available she can take. No prn available for indigestion. MD paged. Continue to monitor.

## 2022-08-30 NOTE — Progress Notes (Addendum)
Pain much improved overnight. No change in examination this morning. Cr 2.5 and is elevated from 1.5 month ago with persistent elevation over the last 4 months.  The risks and benefits of right hip surgery were discussed with the patient, including the possibility of infection, nerve injury, vessel injury, wound breakdown, arthritis, symptomatic hardware, DVT/ PE, loss of motion, malunion, nonunion, and need for further surgery among others. These risks were acknowledged and consent provided to proceed.  Altamese Monong[place], MD [facility], [facility] [phone]

## 2022-08-30 NOTE — Plan of Care (Signed)
  Problem: Education: Goal: Knowledge of General Education information will improve Description: Including pain rating scale, medication(s)/side effects and non-pharmacologic comfort measures Outcome: Progressing   Problem: Health Behavior/Discharge Planning: Goal: Ability to manage health-related needs will improve Outcome: Progressing   Problem: Clinical Measurements: Goal: Will remain free from infection Outcome: Progressing   Problem: Activity: Goal: Risk for activity intolerance will decrease Outcome: Progressing   Problem: Nutrition: Goal: Adequate nutrition will be maintained Outcome: Progressing   Problem: Pain Managment: Goal: General experience of comfort will improve Outcome: Progressing   Problem: Skin Integrity: Goal: Risk for impaired skin integrity will decrease Outcome: Progressing

## 2022-08-31 ENCOUNTER — Inpatient Hospital Stay (HOSPITAL_COMMUNITY): Payer: Worker's Compensation

## 2022-08-31 DIAGNOSIS — C9201 Acute myeloblastic leukemia, in remission: Secondary | ICD-10-CM

## 2022-08-31 DIAGNOSIS — I10 Essential (primary) hypertension: Secondary | ICD-10-CM

## 2022-08-31 DIAGNOSIS — N184 Chronic kidney disease, stage 4 (severe): Secondary | ICD-10-CM

## 2022-08-31 DIAGNOSIS — Z794 Long term (current) use of insulin: Secondary | ICD-10-CM

## 2022-08-31 DIAGNOSIS — S72001A Fracture of unspecified part of neck of right femur, initial encounter for closed fracture: Secondary | ICD-10-CM

## 2022-08-31 DIAGNOSIS — I480 Paroxysmal atrial fibrillation: Secondary | ICD-10-CM

## 2022-08-31 DIAGNOSIS — E1165 Type 2 diabetes mellitus with hyperglycemia: Secondary | ICD-10-CM

## 2022-08-31 DIAGNOSIS — R569 Unspecified convulsions: Secondary | ICD-10-CM

## 2022-08-31 LAB — CBC WITH DIFFERENTIAL/PLATELET
Abs Immature Granulocytes: 0.02 10*3/uL (ref 0.00–0.07)
Basophils Absolute: 0 10*3/uL (ref 0.0–0.1)
Basophils Relative: 0 %
Eosinophils Absolute: 0 10*3/uL (ref 0.0–0.5)
Eosinophils Relative: 0 %
HCT: 29.9 % — ABNORMAL LOW (ref 36.0–46.0)
Hemoglobin: 9.2 g/dL — ABNORMAL LOW (ref 12.0–15.0)
Immature Granulocytes: 0 %
Lymphocytes Relative: 12 %
Lymphs Abs: 0.9 10*3/uL (ref 0.7–4.0)
MCH: 29.7 pg (ref 26.0–34.0)
MCHC: 30.8 g/dL (ref 30.0–36.0)
MCV: 96.5 fL (ref 80.0–100.0)
Monocytes Absolute: 0.5 10*3/uL (ref 0.1–1.0)
Monocytes Relative: 7 %
Neutro Abs: 6 10*3/uL (ref 1.7–7.7)
Neutrophils Relative %: 81 %
Platelets: 89 10*3/uL — ABNORMAL LOW (ref 150–400)
RBC: 3.1 MIL/uL — ABNORMAL LOW (ref 3.87–5.11)
RDW: 13.5 % (ref 11.5–15.5)
WBC: 7.5 10*3/uL (ref 4.0–10.5)
nRBC: 0 % (ref 0.0–0.2)

## 2022-08-31 LAB — GLUCOSE, CAPILLARY
Glucose-Capillary: 161 mg/dL — ABNORMAL HIGH (ref 70–99)
Glucose-Capillary: 58 mg/dL — ABNORMAL LOW (ref 70–99)
Glucose-Capillary: 53 mg/dL — ABNORMAL LOW (ref 70–99)
Glucose-Capillary: 59 mg/dL — ABNORMAL LOW (ref 70–99)
Glucose-Capillary: 94 mg/dL (ref 70–99)
Glucose-Capillary: 80 mg/dL (ref 70–99)
Glucose-Capillary: 82 mg/dL (ref 70–99)

## 2022-08-31 LAB — BASIC METABOLIC PANEL
Anion gap: 6 (ref 5–15)
BUN: 51 mg/dL — ABNORMAL HIGH (ref 8–23)
CO2: 18 mmol/L — ABNORMAL LOW (ref 22–32)
Calcium: 7.5 mg/dL — ABNORMAL LOW (ref 8.9–10.3)
Chloride: 117 mmol/L — ABNORMAL HIGH (ref 98–111)
Creatinine, Ser: 3.24 mg/dL — ABNORMAL HIGH (ref 0.44–1.00)
GFR, Estimated: 14 mL/min — ABNORMAL LOW (ref >60)
Glucose, Bld: 162 mg/dL — ABNORMAL HIGH (ref 70–99)
Potassium: 3.8 mmol/L (ref 3.5–5.1)
Sodium: 141 mmol/L (ref 135–145)

## 2022-08-31 LAB — MAGNESIUM: Magnesium: 2.1 mg/dL (ref 1.7–2.4)

## 2022-08-31 LAB — VITAMIN D 25 HYDROXY (VIT D DEFICIENCY, FRACTURES): Vit D, 25-Hydroxy: 4.88 ng/mL — ABNORMAL LOW (ref 30–100)

## 2022-08-31 MED ORDER — SODIUM CHLORIDE 0.9 % IV SOLN
10.0000 mg | Freq: Once | INTRAVENOUS | Status: AC
  Administered 2022-09-01: 10 mg via INTRAVENOUS
  Filled 2022-08-31: qty 1

## 2022-08-31 MED ORDER — ALUM & MAG HYDROXIDE-SIMETH 200-200-20 MG/5ML PO SUSP
15.0000 mL | Freq: Four times a day (QID) | ORAL | Status: DC | PRN
  Administered 2022-08-31 – 2022-09-03 (×6): 15 mL via ORAL
  Filled 2022-08-31 (×7): qty 30

## 2022-08-31 MED ORDER — SODIUM CHLORIDE 0.9% FLUSH
10.0000 mL | Freq: Two times a day (BID) | INTRAVENOUS | Status: DC
  Administered 2022-08-31 – 2022-09-05 (×9): 10 mL

## 2022-08-31 MED ORDER — SODIUM CHLORIDE 0.9% FLUSH: 10.0000 mL | INTRAVENOUS | Status: DC | PRN

## 2022-08-31 MED ORDER — APIXABAN 2.5 MG PO TABS
2.5000 mg | ORAL_TABLET | Freq: Two times a day (BID) | ORAL | Status: DC
  Administered 2022-08-31 – 2022-09-05 (×11): 2.5 mg via ORAL
  Filled 2022-08-31 (×11): qty 1

## 2022-08-31 MED ORDER — DEXTROSE 50 % IV SOLN
INTRAVENOUS | Status: AC
  Administered 2022-08-31: 12.5 g via INTRAVENOUS
  Filled 2022-08-31: qty 50

## 2022-08-31 MED ORDER — DEXTROSE 50 % IV SOLN: 12.5000 g | INTRAVENOUS | Status: AC

## 2022-08-31 NOTE — Plan of Care (Signed)
  Problem: Education: Goal: Knowledge of General Education information will improve Description: Including pain rating scale, medication(s)/side effects and non-pharmacologic comfort measures Outcome: Progressing   Problem: Health Behavior/Discharge Planning: Goal: Ability to manage health-related needs will improve Outcome: Progressing   Problem: Clinical Measurements: Goal: Will remain free from infection Outcome: Progressing Goal: Cardiovascular complication will be avoided Outcome: Progressing   Problem: Nutrition: Goal: Adequate nutrition will be maintained Outcome: Progressing   Problem: Coping: Goal: Level of anxiety will decrease Outcome: Progressing   Problem: Skin Integrity: Goal: Risk for impaired skin integrity will decrease Outcome: Progressing

## 2022-08-31 NOTE — Evaluation (Signed)
Physical Therapy Evaluation Patient Details Name: [name] MRN: [id number] DOB: March 23, 1941 Today's Date: 08/31/2022  History of Present Illness  Pt is an 81 y.o. female admitted 08/29/22 after fall at work sustaining R intertrochanteric hip fx; also with persistent afib. S/p R hip IMN 9/23. PMH includes DM, CAD, COPD, cardiomyopathy, CKD 2, aflutter, HTN, arthritis, acute myelogenous leukemia, breast CA, uterine CA, seizure disorder.   Clinical Impression  Pt presents with an overall decrease in functional mobility secondary to above. PTA, pt independent, drives, works Paramedic, lives with husband. Educ re: precautions, positioning, activity recommendations, incentive spirometer use (pt pulling ~720m). Today, pt able to initiate standing at taking steps with RW and modA+2. Pt limited by generalized weakness, decreased activity tolerance, impaired balance strategies and pain. Pt would benefit from intensive AIR-level therapies to maximize functional mobility and independence prior to return home.      Recommendations for follow up therapy are one component of a multi-disciplinary discharge planning process, led by the attending physician.  Recommendations may be updated based on patient status, additional functional criteria and insurance authorization.  Follow Up Recommendations Acute inpatient rehab (3hours/day)      Assistance Recommended at Discharge Frequent or constant Supervision/Assistance  Patient can return home with the following  A lot of help with walking and/or transfers;A lot of help with bathing/dressing/bathroom;Assistance with cooking/housework;Assist for transportation;Help with stairs or ramp for entrance    Equipment Recommendations  (TBD)  Recommendations for Other Services   Rehab Consult   Functional Status Assessment Patient has had a recent decline in their functional status and demonstrates the ability to make significant improvements in function in a  reasonable and predictable amount of time.     Precautions / Restrictions Precautions Precautions: Fall;Other (comment) Precaution Comments: Watch HR, SpO2 (does not wear baseline) Restrictions Weight Bearing Restrictions: Yes RLE Weight Bearing: Weight bearing as tolerated      Mobility  Bed Mobility Overal bed mobility: Needs Assistance Bed Mobility: Supine to Sit     Supine to sit: Mod assist, HOB elevated     General bed mobility comments: increased time and effort, modA for RLE management and trunk elevation    Transfers Overall transfer level: Needs assistance Equipment used: Rolling walker (2 wheels) Transfers: Sit to/from Stand, Bed to chair/wheelchair/BSC Sit to Stand: Mod assist, +2 physical assistance   Step pivot transfers: Mod assist, +2 physical assistance       General transfer comment: modA+2 for trunk elevation with 2x standing from EOB to RW, cues for hand placement and sequencing; very slow step pivot from bed to recliner, pt scooting L foot due to difficulty accepting weight onto RLE with noted R knee instability, max cues for sequencing; increased fatigue requiring chair brought behind patient to complete transfer    Ambulation/Gait                  Stairs            Wheelchair Mobility    Modified Rankin (Stroke Patients Only)       Balance Overall balance assessment: Needs assistance Sitting-balance support: No upper extremity supported, Feet supported Sitting balance-Leahy Scale: Fair     Standing balance support: Reliant on assistive device for balance, During functional activity Standing balance-Leahy Scale: Poor Standing balance comment: reliant on BUE support and external assist  Pertinent Vitals/Pain Pain Assessment Pain Assessment: Faces Faces Pain Scale: Hurts even more Pain Location: R hip Pain Descriptors / Indicators: Discomfort, Grimacing, Guarding Pain  Intervention(s): Monitored during session, Limited activity within patient's tolerance, Repositioned    Home Living Family/patient expects to be discharged to:: Private residence Living Arrangements: Spouse/significant other Available Help at Discharge: Family;Available PRN/intermittently Type of Home: House Home Access: Stairs to enter Entrance Stairs-Rails: None Entrance Stairs-Number of Steps: 1   Home Layout: One level Home Equipment: Conservation officer, nature (2 wheels) Additional Comments: reports rolling walker will not fit well in home    Prior Function Prior Level of Function : Independent/Modified Independent;Working/employed;Driving             Mobility Comments: Independent without DME, drives, works making eye glasses (since 1984) ADLs Comments: Independent with ADL/iADLs. Reports she does majority of household tasks, but husband is capable of them and managing his own meds     Hand Dominance        Extremity/Trunk Assessment   Upper Extremity Assessment Upper Extremity Assessment: Overall WFL for tasks assessed    Lower Extremity Assessment Lower Extremity Assessment: RLE deficits/detail RLE Deficits / Details: s/p R hip IMN with expected post-op pain and weakness; R knee flex/ext 3/5, hip <3/5       Communication   Communication: No difficulties  Cognition Arousal/Alertness: Awake/alert Behavior During Therapy: WFL for tasks assessed/performed Overall Cognitive Status: Within Functional Limits for tasks assessed                                          General Comments General comments (skin integrity, edema, etc.): HR 40s-50s, SpO2 88% on RA, 2L O2 replaced at end of session with SpO2 91%    Exercises Other Exercises Other Exercises: incentive spirometer x10 with good technique - pt pulling ~750m   Assessment/Plan    PT Assessment Patient needs continued PT services  PT Problem List Decreased strength;Decreased range of  motion;Decreased activity tolerance;Decreased balance;Decreased mobility;Decreased knowledge of use of DME;Decreased knowledge of precautions;Cardiopulmonary status limiting activity;Pain       PT Treatment Interventions DME instruction;Gait training;Stair training;Functional mobility training;Therapeutic activities;Therapeutic exercise;Balance training;Patient/family education;Wheelchair mobility training    PT Goals (Current goals can be found in the Care Plan section)  Acute Rehab PT Goals [facility]: With patient Time For Goal Achievement: 09/14/22 Potential to Achieve Goals: Good    Frequency Min 5X/week     Co-evaluation PT/OT/SLP Co-Evaluation/Treatment: Yes Reason for Co-Treatment: For patient/therapist safety;To address functional/ADL transfers           AM-PAC PT "6 Clicks" Mobility  Outcome Measure Help needed turning from your back to your side while in a flat bed without using bedrails?: A Lot Help needed moving from lying on your back to sitting on the side of a flat bed without using bedrails?: A Lot Help needed moving to and from a bed to a chair (including a wheelchair)?: Total Help needed standing up from a chair using your arms (e.g., wheelchair or bedside chair)?: Total Help needed to walk in hospital room?: Total Help needed climbing 3-5 steps with a railing? : Total 6 Click Score: 8    End of Session Equipment Utilized During Treatment: Gait belt;Oxygen Activity Tolerance: Patient tolerated treatment well Patient left: with call bell/phone within reach;in chair;with chair alarm set Nurse Communication: Mobility  status;Need for lift equipment PT Visit Diagnosis: Other abnormalities of gait and mobility (R26.89);Muscle weakness (generalized) (M62.81);Pain Pain - Right/Left: Right Pain - part of body: Hip    Time: 0931-1000 PT Time Calculation (min) (ACUTE ONLY): 29 min   Charges:   PT  Evaluation $PT Eval Moderate Complexity: [place], PT, DPT Acute Rehabilitation Services  Personal: [facility] Office: [place] 08/31/2022, 11:25 AM

## 2022-08-31 NOTE — Progress Notes (Signed)
Patient reports nose "burning" and is asking to remove the oxygen. She does need some oxygen support, as she will desaturate down to 86-89% on room air, and lower if sleeping.  Humidifier applied to oxygen. Continue to monitor.  Patient refused ice pack to hip at 0001 and 0230. Educated patient on benefits of ice pack to hip post surgery. She still refuses.

## 2022-08-31 NOTE — Progress Notes (Addendum)
Subjective: 1 Day Post-Op s/p Procedure(s): INTRAMEDULLARY (IM) NAIL INTERTROCHANTERIC   Patient is alert, oriented, laying in bed. States pain is so far well controlled, she has already been up this morning with minimal pain and feels like it went well. No bowel movement yet. Denies chest pain, SOB, Calf pain. No nausea/vomiting or abdominal pain. No other complaints.   Objective:  PE: VITALS:   Vitals:   08/30/22 1900 08/30/22 1939 08/30/22 2313 08/31/22 0332  BP: (!) 111/56   (!) 116/49  Pulse: 65   66  Resp: '13  15 12  '$ Temp: 98.2 F (36.8 C) 98.3 F (36.8 C) 98.5 F (36.9 C) 98.3 F (36.8 C)  TempSrc: Oral Oral Oral Oral  SpO2: 92%   92%  Weight:    80.4 kg  Height:       General: elderly female, in no acute distress Resp: nasal cannula in place, normal respiratory effort  GB:TDVV, nontender abdomen MSK: proximal surgical dressing without drainage. Moderate drainage seen on distal dressing, changed to new mepilex today. Dorsiflexion and plantarflexion intact. Sensation intact to all aspects of foot. + DP pulse, foot warm and well perfused. Moderate edema at ankle and foot, no ecchymoses to RLE.    LABS  Results for orders placed or performed during the hospital encounter of 08/29/22 (from the past 24 hour(s))  Glucose, capillary     Status: Abnormal   Collection Time: 08/30/22 10:03 AM  Result Value Ref Range   Glucose-Capillary 113 (H) 70 - 99 mg/dL  Glucose, capillary     Status: Abnormal   Collection Time: 08/30/22 11:20 AM  Result Value Ref Range   Glucose-Capillary 119 (H) 70 - 99 mg/dL  Glucose, capillary     Status: Abnormal   Collection Time: 08/30/22  4:12 PM  Result Value Ref Range   Glucose-Capillary 156 (H) 70 - 99 mg/dL  Glucose, capillary     Status: Abnormal   Collection Time: 08/30/22  7:12 PM  Result Value Ref Range   Glucose-Capillary 156 (H) 70 - 99 mg/dL  Glucose, capillary     Status: Abnormal   Collection Time: 08/30/22 10:50 PM   Result Value Ref Range   Glucose-Capillary 128 (H) 70 - 99 mg/dL  Glucose, capillary     Status: Abnormal   Collection Time: 08/30/22 11:11 PM  Result Value Ref Range   Glucose-Capillary 128 (H) 70 - 99 mg/dL  Basic metabolic panel     Status: Abnormal   Collection Time: 08/31/22  5:33 AM  Result Value Ref Range   Sodium 141 135 - 145 mmol/L   Potassium 3.8 3.5 - 5.1 mmol/L   Chloride 117 (H) 98 - 111 mmol/L   CO2 18 (L) 22 - 32 mmol/L   Glucose, Bld 162 (H) 70 - 99 mg/dL   BUN 51 (H) 8 - 23 mg/dL   Creatinine, Ser 3.24 (H) 0.44 - 1.00 mg/dL   Calcium 7.5 (L) 8.9 - 10.3 mg/dL   GFR, Estimated 14 (L) >60 mL/min   Anion gap 6 5 - 15  Magnesium     Status: None   Collection Time: 08/31/22  5:33 AM  Result Value Ref Range   Magnesium 2.1 1.7 - 2.4 mg/dL  CBC with Differential/Platelet     Status: Abnormal   Collection Time: 08/31/22  5:33 AM  Result Value Ref Range   WBC 7.5 4.0 - 10.5 K/uL   RBC 3.10 (L) 3.87 - 5.11 MIL/uL   Hemoglobin  9.2 (L) 12.0 - 15.0 g/dL   HCT 29.9 (L) 36.0 - 46.0 %   MCV 96.5 80.0 - 100.0 fL   MCH 29.7 26.0 - 34.0 pg   MCHC 30.8 30.0 - 36.0 g/dL   RDW 13.5 11.5 - 15.5 %   Platelets 89 (L) 150 - 400 K/uL   nRBC 0.0 0.0 - 0.2 %   Neutrophils Relative % 81 %   Neutro Abs 6.0 1.7 - 7.7 K/uL   Lymphocytes Relative 12 %   Lymphs Abs 0.9 0.7 - 4.0 K/uL   Monocytes Relative 7 %   Monocytes Absolute 0.5 0.1 - 1.0 K/uL   Eosinophils Relative 0 %   Eosinophils Absolute 0.0 0.0 - 0.5 K/uL   Basophils Relative 0 %   Basophils Absolute 0.0 0.0 - 0.1 K/uL   Immature Granulocytes 0 %   Abs Immature Granulocytes 0.02 0.00 - 0.07 K/uL  Glucose, capillary     Status: Abnormal   Collection Time: 08/31/22  6:20 AM  Result Value Ref Range   Glucose-Capillary 161 (H) 70 - 99 mg/dL   *Note: Due to a large number of results and/or encounters for the requested time period, some results have not been displayed. A complete set of results can be found in Results Review.     DG Hand Complete Right  Result Date: 08/30/2022 CLINICAL DATA:  Proximal thumb pain. EXAM: RIGHT HAND - COMPLETE 3+ VIEW COMPARISON:  None Available. FINDINGS: There is no evidence of fracture or dislocation. Osteoarthritic changes at the first carpo/metacarpal joint with joint space narrowing and subchondral sclerosis. IMPRESSION: Osteoarthritic changes at the first carpo/metacarpal joint. Electronically Signed   By: Fidela Salisbury M.D.   On: 08/30/2022 15:02   DG HIP UNILAT WITH PELVIS 2-3 VIEWS RIGHT  Result Date: 08/30/2022 CLINICAL DATA:  Postop. EXAM: DG HIP (WITH OR WITHOUT PELVIS) 2-3V RIGHT COMPARISON:  Preoperative imaging. FINDINGS: Femoral intramedullary nail is distal locking and trans trochanteric screw fixation of intertrochanteric femur fracture. Fracture line is not well visible, although alignment is unchanged. No new or periprosthetic fracture. Recent postsurgical change includes air and edema in the soft tissues. IMPRESSION: ORIF of right intertrochanteric femur fracture, no immediate postoperative complication. Electronically Signed   By: Keith Rake M.D.   On: 08/30/2022 10:46   DG FEMUR, MIN 2 VIEWS RIGHT  Result Date: 08/30/2022 CLINICAL DATA:  IM nail. EXAM: RIGHT FEMUR 2 VIEWS COMPARISON:  Preoperative radiograph. FINDINGS: Seven fluoroscopic spot views of the right femur obtained in the operating room. Interval intramedullary nail with trans trochanteric and distal locking screw fixation of proximal femur fracture. There is also a single fluoroscopic spot view of the ankle. Fluoroscopy time 67 seconds. Dose 19.32 mGy. IMPRESSION: Intraoperative fluoroscopy during ORIF of right femur fracture. Electronically Signed   By: Keith Rake M.D.   On: 08/30/2022 10:16   CT Hip Right Wo Contrast  Result Date: 08/29/2022 CLINICAL DATA:  Hip trauma, fracture suspected, xray done r/o occult hip or pelvic/pubic rami fx EXAM: CT OF THE RIGHT HIP WITHOUT CONTRAST  TECHNIQUE: Multidetector CT imaging of the right hip was performed according to the standard protocol. Multiplanar CT image reconstructions were also generated. RADIATION DOSE REDUCTION: This exam was performed according to the departmental dose-optimization program which includes automated exposure control, adjustment of the mA and/or kV according to patient size and/or use of iterative reconstruction technique. COMPARISON:  Same day radiographs FINDINGS: Bones/Joint/Cartilage There is a nondisplaced fracture of the intertrochanteric right proximal femur, and  comminuted involvement of the greater trochanter. Unchanged diffuse mixed sclerosis and lucency within visible pelvis. Ligaments Suboptimally assessed by CT. Muscles and Tendons No acute myotendinous abnormality by CT. Soft tissues No focal fluid collection.  Soft tissue swelling along the hip. IMPRESSION: Nondisplaced intertrochanteric fracture of the right proximal femur, with comminuted involvement of the greater trochanter. Unchanged diffuse mixed sclerosis and lucency within the visible pelvis, suggestive of metastatic disease, as noted on prior exams. Electronically Signed   By: Maurine Simmering M.D.   On: 08/29/2022 15:56   CT HEAD WO CONTRAST (5MM)  Result Date: 08/29/2022 CLINICAL DATA:  Trauma, fall EXAM: CT HEAD WITHOUT CONTRAST TECHNIQUE: Contiguous axial images were obtained from the base of the skull through the vertex without intravenous contrast. RADIATION DOSE REDUCTION: This exam was performed according to the departmental dose-optimization program which includes automated exposure control, adjustment of the mA and/or kV according to patient size and/or use of iterative reconstruction technique. COMPARISON:  12/05/2021 FINDINGS: Brain: No acute intracranial findings are seen in noncontrast CT brain. There are no signs of bleeding within the cranium. Cortical sulci are prominent. There is decreased density in periventricular and subcortical  white matter. There is 1.7 cm dense calcification inseparable from the anterior falx, possibly meningioma with no significant interval change. There is no adjacent edema. Vascular: There are scattered arterial calcifications. Skull: No acute findings are seen. Sinuses/Orbits: There is mild mucosal thickening in ethmoid and sphenoid sinuses. Other: None. IMPRESSION: No acute intracranial findings are seen. Atrophy. Small-vessel disease. There is 1.7 cm dense calcification inseparable from anterior falx suggesting meningioma with no significant interval change. Electronically Signed   By: Elmer Picker M.D.   On: 08/29/2022 15:45   DG FEMUR, MIN 2 VIEWS RIGHT  Result Date: 08/29/2022 CLINICAL DATA:  [place] from standing while at work.  Right hip pain. EXAM: RIGHT FEMUR 2 VIEWS COMPARISON:  Right hip radiographs 08/29/2022, CT abdomen and pelvis 04/17/2022 FINDINGS: Mild-to-moderate medial compartment of the knee joint space narrowing. There is subtle linear lucency overlying the right intertrochanteric region on lateral view. Mild chronic enthesopathic change at the quadriceps insertion on the patella. No joint effusion. Mild right femoroacetabular joint space narrowing and peripheral acetabular degenerative osteophytosis. There are dense vascular calcifications. IMPRESSION: There is craniocaudal linear lucency overlying the superior intertrochanteric region on lateral view only. This could represent an overlying skin marking. It is difficult to exclude an acute nondisplaced fracture in this region. Electronically Signed   By: Yvonne Kendall M.D.   On: 08/29/2022 15:27   DG Tibia/Fibula Right  Result Date: 08/29/2022 CLINICAL DATA:  Trauma, fall EXAM: RIGHT TIBIA AND FIBULA - 2 VIEW COMPARISON:  None Available. FINDINGS: No recent fracture or dislocation is seen. Osteopenia is seen in bony structures. There is soft tissue swelling around the ankle. There is edema in subcutaneous plane in the lower leg.  IMPRESSION: No fracture or dislocation is seen in right tibia and fibula. Electronically Signed   By: Elmer Picker M.D.   On: 08/29/2022 15:24   DG Chest 1 View  Result Date: 08/29/2022 CLINICAL DATA:  Fall.  RIGHT-sided severe pain EXAM: CHEST  1 VIEW COMPARISON:  04/16/2022 FINDINGS: RIGHT-sided port. Enlarged cardiac silhouette. RIGHT pleural effusion. No pneumothorax. No fracture. IMPRESSION: 1. Chronic RIGHT effusion. 2. Cardiomegaly. 3. No acute chest trauma. Electronically Signed   By: Suzy Bouchard M.D.   On: 08/29/2022 14:04   DG HIP UNILAT W OR W/O PELVIS 2-3 VIEWS RIGHT  Result Date: 08/29/2022  CLINICAL DATA:  Fall, right hip pain EXAM: DG HIP (WITH OR WITHOUT PELVIS) 2-3V RIGHT COMPARISON:  04/17/2022 FINDINGS: Bones appear diffusely demineralized. There is no evidence of hip fracture or dislocation. Joint spaces are relatively well preserved. Atherosclerotic vascular calcifications. IMPRESSION: No evidence of right hip fracture or dislocation. Electronically Signed   By: Davina Poke D.O.   On: 08/29/2022 14:02    Assessment/Plan: 81 yo female with right hip fracture 1 Day Post-Op s/p Procedure(s): INTRAMEDULLARY (IM) NAIL INTERTROCHANTERIC  Weightbearing: WBAT RLE, up with therapy Insicional and dressing care: reinforce as needed, distal dressing changed today to new mepliex VTE prophylaxis: Eliquis restarted, Hbg 9.2 this morning Pain control: continue current regimen, pain so far well controlled  Follow - up plan: in 2 weeks with Dr. Marcelino Scot Dispo: pending PT/OT evals   Contact information:   Merlene Pulling, PA-C Weekdays 8-5  After hours and holidays please check [email or web address] for group call information for [facility]  [name] 08/31/2022, 8:23 AM

## 2022-08-31 NOTE — Significant Event (Signed)
Rapid Response Event Note   Reason for Call :  2nd set of eyes, lethargy  Pt her s/p fall with hip fx of which was repaired yesterday.   Initial Focused Assessment:  Pt lying in bed with eyes open, alert and oriented, in no visible distress. Pt denies CP but does c/o indigestion which is new for her. Pupils 2s, equal, and reactive. Pt moving all extremities and following commands. Breathing is unlabored, lungs are clear. ABD is large, soft. Skin warm to touch.   T-97.5, HR-62, BP-123/55, RR-19, SpO2-96% on 3L [place], CBG-82.  Interventions:  No RRT interventions needed.   Plan of Care:  Pt neuro intact and not sleepy on my exam, however, pt said she has been very sleepy today. She thinks it could be because her blood sugar is low as this happens to her at home. She is having indigestion despite maalox. ??whether another medication for indigestion would benefit  her?? She was given dilaudid at 2135 however pt was sleepy prior to this per bedside RN. Continue to monitor pt. Call RRT if further assistance needed.   Event Summary:   MD Notified: Dr. Myna Hidalgo notified PTA RRT Call [place], Mamoru Takeshita Anderson, RN

## 2022-08-31 NOTE — Progress Notes (Signed)
Orthopedic Tech Progress Note Patient Details:  [name] 30-Nov-1941 [id number]  Patient ID: [name], female   DOB: [birth year], 81 y.o.   MRN: [id number] The RN called to ask about the OHF. I explained that the patient does not meet the requirements for an OHF. No one over 70 is supposed to have an OHF. Karolee Stamps 08/31/2022, 11:42 PM

## 2022-08-31 NOTE — Progress Notes (Signed)
PROGRESS NOTE    [name]  TOI:[id number] DOB: [birth year] DOA: 08/29/2022 PCP: Isaac Bliss, Rayford Halsted, MD   Brief Narrative:  81 y.o. female with medical history significant of AML (remote); afib on Eliquis; chronic back pain; COPD; CAD; DM; HTN; HLD; chronic diastolic CHF; uterine/breast cancer; and CAD presented with a fall and was found to have right hip fracture.  CT of the head was negative for acute intracranial abnormity.  Orthopedics was consulted.  She underwent surgical intervention on 08/30/2022.  Assessment & Plan:   Right hip fracture after mechanical fall -underwent surgical intervention on 08/30/2022. -Follow further orthopedics recommendations.  Continue pain management.  Eliquis has been resumed. -Fall precautions -PT/OT eval.  Persistent A-fib -Mild intermittent bradycardia noted.  Eliquis has been resumed postprocedure.  Chronic diastolic CHF -Echo on 8/33/8250 had shown EF of 60 to 65%.  Currently showing no signs of volume overload.  Strict input and output.  Daily weights. -Losartan and Jardiance on hold.  Outpatient follow-up with cardiology  History of AML in remission and recent breast cancer -Outpatient follow-up with oncology/Dr. [name].  Letrozole on hold for now.  Seizure disorder -Continue Keppra  Acute kidney injury on chronic kidney disease stage IV Acute metabolic acidosis -Baseline creatinine around 1.5-1.9.  Creatinine has worsened to 3.24 this morning.  Increase normal saline to 100 cc an hour.  Repeat a.m. labs  Anemia of chronic disease--possibly from renal failure.  Hemoglobin stable.  Monitor intermittently  Thrombocytopenia -Questionable cause.  Monitor intermittently.  No signs of bleeding currently.  Diabetes mellitus type II with hyperglycemia -Continue long-acting insulin along with CBGs with SSI  Hyperlipidemia -Continue statin  Hypertension -Monitor blood pressure.  Continue Imdur.  Amlodipine and metoprolol on hold  for now  CAD -Status post history of stenting -no chest pain. -Continue Imdur.  Resume Plavix postprocedure  COPD -Stable.  Continue albuterol as needed  Chronic pain -Continue current pain management.  Outpatient follow-up with PCP/pain management  DVT prophylaxis: SCDs Code Status: Full Family Communication: None at bedside Disposition Plan: Status is: Inpatient Remains inpatient appropriate because: Of severity of illness. may need SNF placement.  Consultants: Orthopedics  Procedures: Intramedullary nailing of the right hip on 08/30/2022 Antimicrobials: Perioperative   Subjective: Patient seen and examined at bedside.  No seizures, vomiting, fever reported.  Complains of indigestion.  Complains of intermittent right hip pain.  Objective: Vitals:   08/30/22 1900 08/30/22 1939 08/30/22 2313 08/31/22 0332  BP: (!) 111/56   (!) 116/49  Pulse: 65   66  Resp: '13  15 12  '$ Temp: 98.2 F (36.8 C) 98.3 F (36.8 C) 98.5 F (36.9 C) 98.3 F (36.8 C)  TempSrc: Oral Oral Oral Oral  SpO2: 92%   92%  Weight:    80.4 kg  Height:        Intake/Output Summary (Last 24 hours) at 08/31/2022 0750 Last data filed at 08/30/2022 1500 Gross per 24 hour  Intake 578.82 ml  Output 25 ml  Net 553.82 ml    Filed Weights   08/29/22 2039 08/30/22 0753 08/31/22 0332  Weight: 72.5 kg 72.5 kg 80.4 kg    Examination:  General: On room air.  No distress.  Elderly female sitting in chair. ENT/neck: No thyromegaly.  JVD is not elevated  respiratory: Decreased breath sounds at bases bilaterally with some crackles; no wheezing CVS: S1-S2 heard, rate controlled Abdominal: Soft, nontender, slightly distended; no organomegaly, normal bowel sounds are heard Extremities: Trace lower extremity edema; no  cyanosis  CNS: Awake and alert.  Slow to respond.  No focal neurologic deficit.  Moves extremities Lymph: No obvious lymphadenopathy Skin: No obvious ecchymosis/lesions  psych: Currently not  agitated.  Affect is flat.   Musculoskeletal: No obvious other joint swelling/deformity.  Right hip tenderness present    Data Reviewed: I have personally reviewed following labs and imaging studies  CBC: Recent Labs  Lab 08/29/22 1422 08/30/22 0400 08/31/22 0533  WBC 9.5 7.7 7.5  NEUTROABS  --   --  6.0  HGB 11.1* 11.1* 9.2*  HCT 33.6* 34.2* 29.9*  MCV 92.3 94.5 96.5  PLT 120* 114* 89*    Basic Metabolic Panel: Recent Labs  Lab 08/29/22 1422 08/30/22 0400 08/31/22 0533  NA 140 141 141  K 3.5 3.7 3.8  CL 116* 114* 117*  CO2 16* 16* 18*  GLUCOSE 242* 198* 162*  BUN 49* 50* 51*  CREATININE 2.41* 2.56* 3.24*  CALCIUM 7.3* 7.7* 7.5*  MG  --   --  2.1    GFR: Estimated Creatinine Clearance: 13.6 mL/min (A) (by C-G formula based on SCr of 3.24 mg/dL (H)). Liver Function Tests: No results for input(s): "AST", "ALT", "ALKPHOS", "BILITOT", "PROT", "ALBUMIN" in the last 168 hours. No results for input(s): "LIPASE", "AMYLASE" in the last 168 hours. No results for input(s): "AMMONIA" in the last 168 hours. Coagulation Profile: No results for input(s): "INR", "PROTIME" in the last 168 hours. Cardiac Enzymes: No results for input(s): "CKTOTAL", "CKMB", "CKMBINDEX", "TROPONINI" in the last 168 hours. BNP (last 3 results) No results for input(s): "PROBNP" in the last 8760 hours. HbA1C: No results for input(s): "HGBA1C" in the last 72 hours. CBG: Recent Labs  Lab 08/30/22 1612 08/30/22 1912 08/30/22 2250 08/30/22 2311 08/31/22 0620  GLUCAP 156* 156* 128* 128* 161*    Lipid Profile: No results for input(s): "CHOL", "HDL", "LDLCALC", "TRIG", "CHOLHDL", "LDLDIRECT" in the last 72 hours. Thyroid Function Tests: No results for input(s): "TSH", "T4TOTAL", "FREET4", "T3FREE", "THYROIDAB" in the last 72 hours. Anemia Panel: No results for input(s): "VITAMINB12", "FOLATE", "FERRITIN", "TIBC", "IRON", "RETICCTPCT" in the last 72 hours. Sepsis Labs: No results for input(s):  "PROCALCITON", "LATICACIDVEN" in the last 168 hours.  Recent Results (from the past 240 hour(s))  MRSA Next Gen by PCR, Nasal     Status: None   Collection Time: 08/29/22  9:30 PM   Specimen: Nasal Mucosa; Nasal Swab  Result Value Ref Range Status   MRSA by PCR Next Gen NOT DETECTED NOT DETECTED Final    Comment: (NOTE) The GeneXpert MRSA Assay (FDA approved for NASAL specimens only), is one component of a comprehensive MRSA colonization surveillance program. It is not intended to diagnose MRSA infection nor to guide or monitor treatment for MRSA infections. Test performance is not FDA approved in patients less than 108 years old. Performed at [facility], [place] [address] [place], [place] [address]          Radiology Studies: DG Hand Complete Right  Result Date: 08/30/2022 CLINICAL DATA:  Proximal thumb pain. EXAM: RIGHT HAND - COMPLETE 3+ VIEW COMPARISON:  None Available. FINDINGS: There is no evidence of fracture or dislocation. Osteoarthritic changes at the first carpo/metacarpal joint with joint space narrowing and subchondral sclerosis. IMPRESSION: Osteoarthritic changes at the first carpo/metacarpal joint. Electronically Signed   By: Fidela Salisbury M.D.   On: 08/30/2022 15:02   DG HIP UNILAT WITH PELVIS 2-3 VIEWS RIGHT  Result Date: 08/30/2022 CLINICAL DATA:  Postop. EXAM: DG HIP (  WITH OR WITHOUT PELVIS) 2-3V RIGHT COMPARISON:  Preoperative imaging. FINDINGS: Femoral intramedullary nail is distal locking and trans trochanteric screw fixation of intertrochanteric femur fracture. Fracture line is not well visible, although alignment is unchanged. No new or periprosthetic fracture. Recent postsurgical change includes air and edema in the soft tissues. IMPRESSION: ORIF of right intertrochanteric femur fracture, no immediate postoperative complication. Electronically Signed   By: Keith Rake M.D.   On: 08/30/2022 10:46   DG FEMUR, MIN 2 VIEWS RIGHT  Result Date:  08/30/2022 CLINICAL DATA:  IM nail. EXAM: RIGHT FEMUR 2 VIEWS COMPARISON:  Preoperative radiograph. FINDINGS: Seven fluoroscopic spot views of the right femur obtained in the operating room. Interval intramedullary nail with trans trochanteric and distal locking screw fixation of proximal femur fracture. There is also a single fluoroscopic spot view of the ankle. Fluoroscopy time 67 seconds. Dose 19.32 mGy. IMPRESSION: Intraoperative fluoroscopy during ORIF of right femur fracture. Electronically Signed   By: Keith Rake M.D.   On: 08/30/2022 10:16   CT Hip Right Wo Contrast  Result Date: 08/29/2022 CLINICAL DATA:  Hip trauma, fracture suspected, xray done r/o occult hip or pelvic/pubic rami fx EXAM: CT OF THE RIGHT HIP WITHOUT CONTRAST TECHNIQUE: Multidetector CT imaging of the right hip was performed according to the standard protocol. Multiplanar CT image reconstructions were also generated. RADIATION DOSE REDUCTION: This exam was performed according to the departmental dose-optimization program which includes automated exposure control, adjustment of the mA and/or kV according to patient size and/or use of iterative reconstruction technique. COMPARISON:  Same day radiographs FINDINGS: Bones/Joint/Cartilage There is a nondisplaced fracture of the intertrochanteric right proximal femur, and comminuted involvement of the greater trochanter. Unchanged diffuse mixed sclerosis and lucency within visible pelvis. Ligaments Suboptimally assessed by CT. Muscles and Tendons No acute myotendinous abnormality by CT. Soft tissues No focal fluid collection.  Soft tissue swelling along the hip. IMPRESSION: Nondisplaced intertrochanteric fracture of the right proximal femur, with comminuted involvement of the greater trochanter. Unchanged diffuse mixed sclerosis and lucency within the visible pelvis, suggestive of metastatic disease, as noted on prior exams. Electronically Signed   By: Maurine Simmering M.D.   On: 08/29/2022  15:56   CT HEAD WO CONTRAST (5MM)  Result Date: 08/29/2022 CLINICAL DATA:  Trauma, fall EXAM: CT HEAD WITHOUT CONTRAST TECHNIQUE: Contiguous axial images were obtained from the base of the skull through the vertex without intravenous contrast. RADIATION DOSE REDUCTION: This exam was performed according to the departmental dose-optimization program which includes automated exposure control, adjustment of the mA and/or kV according to patient size and/or use of iterative reconstruction technique. COMPARISON:  12/05/2021 FINDINGS: Brain: No acute intracranial findings are seen in noncontrast CT brain. There are no signs of bleeding within the cranium. Cortical sulci are prominent. There is decreased density in periventricular and subcortical white matter. There is 1.7 cm dense calcification inseparable from the anterior falx, possibly meningioma with no significant interval change. There is no adjacent edema. Vascular: There are scattered arterial calcifications. Skull: No acute findings are seen. Sinuses/Orbits: There is mild mucosal thickening in ethmoid and sphenoid sinuses. Other: None. IMPRESSION: No acute intracranial findings are seen. Atrophy. Small-vessel disease. There is 1.7 cm dense calcification inseparable from anterior falx suggesting meningioma with no significant interval change. Electronically Signed   By: Elmer Picker M.D.   On: 08/29/2022 15:45   DG FEMUR, MIN 2 VIEWS RIGHT  Result Date: 08/29/2022 CLINICAL DATA:  [place] from standing while at work.  Right hip  pain. EXAM: RIGHT FEMUR 2 VIEWS COMPARISON:  Right hip radiographs 08/29/2022, CT abdomen and pelvis 04/17/2022 FINDINGS: Mild-to-moderate medial compartment of the knee joint space narrowing. There is subtle linear lucency overlying the right intertrochanteric region on lateral view. Mild chronic enthesopathic change at the quadriceps insertion on the patella. No joint effusion. Mild right femoroacetabular joint space narrowing  and peripheral acetabular degenerative osteophytosis. There are dense vascular calcifications. IMPRESSION: There is craniocaudal linear lucency overlying the superior intertrochanteric region on lateral view only. This could represent an overlying skin marking. It is difficult to exclude an acute nondisplaced fracture in this region. Electronically Signed   By: Yvonne Kendall M.D.   On: 08/29/2022 15:27   DG Tibia/Fibula Right  Result Date: 08/29/2022 CLINICAL DATA:  Trauma, fall EXAM: RIGHT TIBIA AND FIBULA - 2 VIEW COMPARISON:  None Available. FINDINGS: No recent fracture or dislocation is seen. Osteopenia is seen in bony structures. There is soft tissue swelling around the ankle. There is edema in subcutaneous plane in the lower leg. IMPRESSION: No fracture or dislocation is seen in right tibia and fibula. Electronically Signed   By: Elmer Picker M.D.   On: 08/29/2022 15:24   DG Chest 1 View  Result Date: 08/29/2022 CLINICAL DATA:  Fall.  RIGHT-sided severe pain EXAM: CHEST  1 VIEW COMPARISON:  04/16/2022 FINDINGS: RIGHT-sided port. Enlarged cardiac silhouette. RIGHT pleural effusion. No pneumothorax. No fracture. IMPRESSION: 1. Chronic RIGHT effusion. 2. Cardiomegaly. 3. No acute chest trauma. Electronically Signed   By: Suzy Bouchard M.D.   On: 08/29/2022 14:04   DG HIP UNILAT W OR W/O PELVIS 2-3 VIEWS RIGHT  Result Date: 08/29/2022 CLINICAL DATA:  Fall, right hip pain EXAM: DG HIP (WITH OR WITHOUT PELVIS) 2-3V RIGHT COMPARISON:  04/17/2022 FINDINGS: Bones appear diffusely demineralized. There is no evidence of hip fracture or dislocation. Joint spaces are relatively well preserved. Atherosclerotic vascular calcifications. IMPRESSION: No evidence of right hip fracture or dislocation. Electronically Signed   By: Davina Poke D.O.   On: 08/29/2022 14:02        Scheduled Meds:  acetaminophen  1,000 mg Oral Q8H   apixaban  2.5 mg Oral BID   atorvastatin  40 mg Oral Daily    Chlorhexidine Gluconate Cloth  6 each Topical Q0600   docusate sodium  100 mg Oral BID   feeding supplement (GLUCERNA SHAKE)  237 mL Oral BID BM   influenza vaccine adjuvanted  0.5 mL Intramuscular Tomorrow-1000   insulin aspart  0-15 Units Subcutaneous TID WC   insulin aspart  0-5 Units Subcutaneous QHS   insulin glargine-yfgn  12 Units Subcutaneous QHS   isosorbide mononitrate  45 mg Oral Daily   levETIRAcetam  500 mg Oral BID   multivitamin with minerals  1 tablet Oral Daily   sodium chloride flush  10-40 mL Intracatheter Q12H   Vitamin D (Ergocalciferol)  50,000 Units Oral Q7 days   Continuous Infusions:  sodium chloride 75 mL/hr at 08/31/22 3546   methocarbamol (ROBAXIN) IV            Aline August, MD [facility] 08/31/2022, 7:50 AM

## 2022-08-31 NOTE — Evaluation (Signed)
Occupational Therapy Evaluation Patient Details Name: [name] MRN: [id number] DOB: 03/02/1941 Today's Date: 08/31/2022   History of Present Illness Pt is an 81 y.o. female admitted 08/29/22 after fall at work sustaining R intertrochanteric hip fx; also with persistent afib. S/p R hip IMN 9/23. PMH includes DM, CAD, COPD, cardiomyopathy, CKD 2, aflutter, HTN, arthritis, acute myelogenous leukemia, breast CA, uterine CA, seizure disorder.   Clinical Impression   Pt is typically independent in ADL and mobility, works Equities trader, drives. Today is painful in R hip, max A for LB ADL, set up for grooming, self-feeding, mod A for UB bathing, Mod A +2 for transfers with pivotal steps to recliner requiring cues for sequencing and compensatory strategies. HR 40-50 throughout and Pt required 2L O2 to maintain SpO2 >90% when she is  not typically on O2. At this time recommending AIR post-acute to maximize safety and independence in ADL and functional transfers as Pt is motivated to return to PLOF and working/driving.      Recommendations for follow up therapy are one component of a multi-disciplinary discharge planning process, led by the attending physician.  Recommendations may be updated based on patient status, additional functional criteria and insurance authorization.   Follow Up Recommendations  Acute inpatient rehab (3hours/day)    Assistance Recommended at Discharge Frequent or constant Supervision/Assistance  Patient can return home with the following Two people to help with walking and/or transfers;A lot of help with bathing/dressing/bathroom;Assistance with cooking/housework;Assist for transportation;Help with stairs or ramp for entrance    Functional Status Assessment  Patient has had a recent decline in their functional status and demonstrates the ability to make significant improvements in function in a reasonable and predictable amount of time.  Equipment  Recommendations  Tub/shower seat;BSC/3in1    Recommendations for Other Services Rehab consult;PT consult     Precautions / Restrictions Precautions Precautions: Fall;Other (comment) Precaution Comments: Watch HR, SpO2 (does not wear baseline) Restrictions Weight Bearing Restrictions: Yes RLE Weight Bearing: Weight bearing as tolerated      Mobility Bed Mobility Overal bed mobility: Needs Assistance Bed Mobility: Supine to Sit     Supine to sit: Mod assist, HOB elevated     General bed mobility comments: increased time and effort, modA for RLE management and trunk elevation    Transfers Overall transfer level: Needs assistance Equipment used: Rolling walker (2 wheels) Transfers: Sit to/from Stand, Bed to chair/wheelchair/BSC Sit to Stand: Mod assist, +2 physical assistance     Step pivot transfers: Mod assist, +2 physical assistance     General transfer comment: modA+2 for trunk elevation with 2x standing from EOB to RW, cues for hand placement and sequencing; very slow step pivot from bed to recliner, pt scooting L foot due to difficulty accepting weight onto RLE with noted R knee instability, max cues for sequencing; increased fatigue requiring chair brought behind patient to complete transfer      Balance Overall balance assessment: Needs assistance Sitting-balance support: No upper extremity supported, Feet supported Sitting balance-Leahy Scale: Fair     Standing balance support: Reliant on assistive device for balance, During functional activity Standing balance-Leahy Scale: Poor Standing balance comment: reliant on BUE support and external assist                           ADL either performed or assessed with clinical judgement   ADL Overall ADL's : Needs assistance/impaired Eating/Feeding: Set up;Sitting   Grooming: Wash/dry  hands;Wash/dry face;Set up;Sitting Grooming Details (indicate cue type and reason): sitting EOB Upper Body Bathing:  Moderate assistance   Lower Body Bathing: Maximal assistance   Upper Body Dressing : Minimal assistance;Sitting   Lower Body Dressing: Maximal assistance;Sitting/lateral leans   Toilet Transfer: Moderate assistance;+2 for physical assistance;+2 for safety/equipment;Stand-pivot;BSC/3in1;Rolling walker (2 wheels) Toilet Transfer Details (indicate cue type and reason): cues for sequencing with weight shifts Toileting- Clothing Manipulation and Hygiene: Maximal assistance;+2 for safety/equipment;Sit to/from stand       Functional mobility during ADLs: Moderate assistance;+2 for physical assistance;+2 for safety/equipment;Rolling walker (2 wheels);Cueing for sequencing General ADL Comments: decreased access to LB for ADL, increased work for transfers, watch HR     Vision Baseline Vision/History: 1 Wears glasses Ability to See in Adequate Light: 0 Adequate Patient Visual Report: No change from baseline Vision Assessment?: No apparent visual deficits     Perception     Praxis      Pertinent Vitals/Pain Pain Assessment Pain Assessment: Faces Faces Pain Scale: Hurts even more Pain Location: R hip Pain Descriptors / Indicators: Discomfort, Grimacing, Guarding Pain Intervention(s): Monitored during session, Repositioned     Hand Dominance     Extremity/Trunk Assessment Upper Extremity Assessment Upper Extremity Assessment: Overall WFL for tasks assessed   Lower Extremity Assessment Lower Extremity Assessment: Defer to PT evaluation RLE Deficits / Details: s/p R hip IMN with expected post-op pain and weakness; R knee flex/ext 3/5, hip <3/5       Communication Communication Communication: No difficulties   Cognition Arousal/Alertness: Awake/alert Behavior During Therapy: WFL for tasks assessed/performed Overall Cognitive Status: Within Functional Limits for tasks assessed                                       General Comments  HR 40s-50s, SpO2 88% on RA,  2L O2 replaced at end of session with SpO2 91%    Exercises Other Exercises Other Exercises: incentive spirometer x10 with good technique - pt pulling ~735m   Shoulder Instructions      Home Living Family/patient expects to be discharged to:: Private residence Living Arrangements: Spouse/significant other Available Help at Discharge: Family;Available PRN/intermittently Type of Home: House Home Access: Stairs to enter [employer] Steps: 1 Entrance Stairs-Rails: None Home Layout: One level     Bathroom Shower/Tub: TTeacher, early years/pre Handicapped height     Home Equipment: RConservation officer, nature(2 wheels)   Additional Comments: reports rolling walker will not fit well in home, husband cannot assist - daughter is HCPOA      Prior Functioning/Environment Prior Level of Function : Independent/Modified Independent;Working/employed;Driving             Mobility Comments: Independent without DME, drives, works making eye glasses (since 1984) ADLs Comments: Independent with ADL/iADLs. Reports she does majority of household tasks, but husband is capable of them and managing his own meds        OT Problem List: Decreased strength;Decreased range of motion;Decreased activity tolerance;Impaired balance (sitting and/or standing);Decreased safety awareness;Decreased knowledge of use of DME or AE;Cardiopulmonary status limiting activity;Obesity;Pain      OT Treatment/Interventions: Self-care/ADL training;DME and/or AE instruction;Therapeutic activities;Patient/family education;Balance training    OT Goals(Current goals can be found in the care plan section) Acute Rehab OT Goals Patient Stated Goal: to get back to work OT Goal Formulation: With patient Time For Goal Achievement: 09/14/22 Potential to Achieve Goals: Good  ADL Goals Pt Will Perform Grooming: with supervision;standing Pt Will Perform Upper Body Dressing: with modified independence;sitting Pt  Will Perform Lower Body Dressing: with supervision;sit to/from stand Pt Will Transfer to Toilet: with supervision;ambulating Pt Will Perform Toileting - Clothing Manipulation and hygiene: with supervision;sitting/lateral leans Additional ADL Goal #1: Pt will perform bed mobility at supervision level prior to engaging in ADL  OT Frequency: Min 2X/week    Co-evaluation PT/OT/SLP Co-Evaluation/Treatment: Yes Reason for Co-Treatment: For patient/therapist safety;To address functional/ADL transfers PT goals addressed during session: Mobility/safety with mobility;Balance;Proper use of DME;Strengthening/ROM OT goals addressed during session: ADL's and self-care;Proper use of Adaptive equipment and DME;Strengthening/ROM      AM-PAC OT "6 Clicks" Daily Activity     Outcome Measure Help from another person eating meals?: None Help from another person taking care of personal grooming?: A Little Help from another person toileting, which includes using toliet, bedpan, or urinal?: A Lot Help from another person bathing (including washing, rinsing, drying)?: A Lot Help from another person to put on and taking off regular upper body clothing?: A Little Help from another person to put on and taking off regular lower body clothing?: A Lot 6 Click Score: 16   End of Session Equipment Utilized During Treatment: Gait belt;Rolling walker (2 wheels);Oxygen (2L) Nurse Communication: Mobility status;Precautions  Activity Tolerance: Patient tolerated treatment well Patient left: in chair;with call bell/phone within reach;with chair alarm set  OT Visit Diagnosis: Unsteadiness on feet (R26.81);Other abnormalities of gait and mobility (R26.89);History of falling (Z91.81);Muscle weakness (generalized) (M62.81);Pain Pain - Right/Left: Right Pain - part of body: Hip                Time: 0931-1000 OT Time Calculation (min): 29 min Charges:  OT General Charges $OT Visit: 1 Visit OT Evaluation $OT Eval Moderate  Complexity: [place] OTR/L Acute Rehabilitation Services Office: [place] 08/31/2022, 1:10 PM

## 2022-08-31 NOTE — Progress Notes (Signed)
ANTICOAGULATION CONSULT NOTE - Initial Consult  Pharmacy Consult for apixaban Indication: atrial fibrillation  No Known Allergies  Patient Measurements: Height: 5' 2.01" (157.5 cm) Weight: 80.4 kg (177 lb 4 oz) IBW/kg (Calculated) : 50.12  Vital Signs: Temp: 98.3 F (36.8 C) (09/24 0332) Temp Source: Oral (09/24 0332) BP: 116/49 (09/24 0332) Pulse Rate: 66 (09/24 0332)  Labs: Recent Labs    08/29/22 1422 08/30/22 0400 08/31/22 0533  HGB 11.1* 11.1* 9.2*  HCT 33.6* 34.2* 29.9*  PLT 120* 114* 89*  CREATININE 2.41* 2.56* 3.24*    Estimated Creatinine Clearance: 13.6 mL/min (A) (by C-G formula based on SCr of 3.24 mg/dL (H)).   Medical History: Past Medical History:  Diagnosis Date   Acute myelogenous leukemia ([place]) 02/2014   Acute pancreatitis    Arthritis    "back" (09/17/2017)   Atrial flutter (HCC)    Cardiomyopathy ([place])    Chronic kidney disease, stage II (mild)    Chronic lower back pain    Colon polyps 04/29/2010   TUBULAR ADENOMA AND A SERRATED ADENOMA   COPD (chronic obstructive pulmonary disease) ([place])    CORONARY ARTERY DISEASE 12/24/2007   DIABETES MELLITUS, TYPE II 07/13/2007   History of blood transfusion 2015   "related to leukemia"   History of kidney stones 12/24/2007   History of uterine cancer    HYPERLIPIDEMIA 12/24/2007   HYPERTENSION 07/13/2007   NSTEMI (non-ST elevated myocardial infarction) ([place]) 09/17/2017   Uterine cancer (HCC)     Medications:  Medications Prior to Admission  Medication Sig Dispense Refill Last Dose   apixaban (ELIQUIS) 2.5 MG TABS tablet Take 1 tablet (2.5 mg total) by mouth 2 (two) times daily. 60 tablet 0 08/29/2022 at 0900   atorvastatin (LIPITOR) 40 MG tablet TAKE 1 TABLET EVERY DAY (Patient taking differently: Take 40 mg by mouth every evening.) 90 tablet 1 08/28/2022   clopidogrel (PLAVIX) 75 MG tablet TAKE 1 TABLET EVERY EVENING (Patient taking differently: Take 75 mg by mouth every evening.) 90 tablet 1  08/28/2022   cyanocobalamin (VITAMIN B12) 1000 MCG/ML injection INJECT 1ML IN THE MUSCLE EVERY 30 DAYS AS DIRECTED 3 mL 3 Past Month   empagliflozin (JARDIANCE) 25 MG TABS tablet Take 1 tablet (25 mg total) by mouth daily. 30 tablet 11 08/29/2022   fluticasone furoate-vilanterol (BREO ELLIPTA) 200-25 MCG/ACT AEPB Inhale 1 puff into the lungs as needed (wheezing SOB).   Past Month   HYDROcodone-acetaminophen (NORCO) 5-325 MG tablet Take 1 tablet by mouth every 6 (six) hours as needed for moderate pain. 120 tablet 0 08/28/2022   insulin aspart (NOVOLOG FLEXPEN) 100 UNIT/ML FlexPen Max daily 45 units 45 mL 3 08/29/2022   insulin glargine, 2 Unit Dial, (TOUJEO MAX SOLOSTAR) 300 UNIT/ML Solostar Pen Inject 16 Units into the skin at bedtime. 15 mL 3 08/28/2022   isosorbide mononitrate (IMDUR) 30 MG 24 hr tablet Take 1 tablet (30 mg total) by mouth daily. TAKE 1 AND 1/2 TABLETS EVERY DAY   08/29/2022   letrozole (FEMARA) 2.5 MG tablet Take 1 tablet (2.5 mg total) by mouth daily. 90 tablet 3 08/29/2022   levETIRAcetam (KEPPRA) 500 MG tablet Take 1 tablet (500 mg total) by mouth 2 (two) times daily. 180 tablet 4 08/29/2022   metoprolol succinate (TOPROL-XL) 25 MG 24 hr tablet Take 0.5 tablets (12.5 mg total) by mouth daily. Take with or immediately following a meal. 30 tablet 11 08/29/2022 at 0900   nitroGLYCERIN (NITROSTAT) 0.4 MG SL tablet PLACE 1 TABLET (0.4  MG TOTAL) UNDER THE TONGUE EVERY 5 (FIVE) MINUTES AS NEEDED FOR CHEST PAIN. 25 tablet 0 unk   potassium chloride (KLOR-CON) 10 MEQ tablet Take 1 tablet by mouth twice daily for 3 days, then resume once daily dosing thereafter 90 tablet 3 08/29/2022   torsemide (DEMADEX) 20 MG tablet Take 2 tablets (73m) by mouth twice daily for 3 days, then resume once daily dosing thereafter 120 tablet 3 08/29/2022   valsartan (DIOVAN) 80 MG tablet Take 1 tablet (80 mg total) by mouth daily. 90 tablet 3 08/29/2022   Blood Glucose Monitoring Suppl (TRUE METRIX AIR GLUCOSE METER)  w/Device KIT USE AS DIRECTED 1 kit 0    Insulin Pen Needle 32G X 4 MM MISC 1 Device by Does not apply route in the morning, at noon, in the evening, and at bedtime. 400 each 3    lidocaine 4 % Place 1 patch onto the skin daily. 30 patch 0    lipase/protease/amylase (CREON) 36000 UNITS CPEP capsule Take 2 capsules (72,000 Units total) by mouth 3 (three) times daily with meals. (Patient taking differently: Take 72,000 Units by mouth as needed.) 48 capsule 0    LORazepam (ATIVAN) 0.5 MG tablet TAKE 1 TABLET BY MOUTH EVERY 8 HOURS AS NEEDED FOR ANXIETY(DO NOT EXCEED MORE THAN 2 TABS IN 24 HRS) 60 tablet 0    MAGNESIUM OXIDE 400 PO Take 400 mg by mouth 2 (two) times daily.      polyethylene glycol (MIRALAX / GLYCOLAX) 17 g packet Take 17 g by mouth daily as needed for mild constipation.      potassium chloride (KLOR-CON) 10 MEQ tablet TAKE 1 TABLET EVERY DAY (Patient taking differently: Take 10 mEq by mouth daily.) 90 tablet 1    SYRINGE-NEEDLE, DISP, 3 ML (BD SAFETYGLIDE SYRINGE/NEEDLE) 25G X 1" 3 ML MISC Use for B12 injections 100 each 11    torsemide (DEMADEX) 20 MG tablet Take 2 tablets (40 mg total) by mouth daily. (Patient not taking: Reported on 08/29/2022) 60 tablet 0 Not Taking   TRUE METRIX BLOOD GLUCOSE TEST test strip TEST BLOOD SUGAR THREE TIMES DAILY 300 strip 2     Assessment: Pharmacy consulted to resume PTA apixaban if hemoglobin >8 and no transfusions on POD1. Hemoglobin this AM is 9.2 and no transfusions required overnight. Renal function is markedly lower. PTA dose is appropriate based on age, wt, and creatinine. Pharmacy will resume and sign off.  Plan:  Start apixaban 2.5 mg twice daily  Thank you for allowing pharmacy to participate in this patient's care.  NReatha Harps PharmD PGY2 Pharmacy Resident 08/31/2022 7:11 AM Check [email or web address] for unit specific pharmacy number

## 2022-09-01 DIAGNOSIS — N184 Chronic kidney disease, stage 4 (severe): Secondary | ICD-10-CM

## 2022-09-01 DIAGNOSIS — S72001A Fracture of unspecified part of neck of right femur, initial encounter for closed fracture: Secondary | ICD-10-CM

## 2022-09-01 DIAGNOSIS — R338 Other retention of urine: Secondary | ICD-10-CM

## 2022-09-01 DIAGNOSIS — I10 Essential (primary) hypertension: Secondary | ICD-10-CM

## 2022-09-01 LAB — CBC
HCT: 27.7 % — ABNORMAL LOW (ref 36.0–46.0)
Hemoglobin: 8.9 g/dL — ABNORMAL LOW (ref 12.0–15.0)
MCH: 30.8 pg (ref 26.0–34.0)
MCHC: 32.1 g/dL (ref 30.0–36.0)
MCV: 95.8 fL (ref 80.0–100.0)
Platelets: 97 10*3/uL — ABNORMAL LOW (ref 150–400)
RBC: 2.89 MIL/uL — ABNORMAL LOW (ref 3.87–5.11)
RDW: 13.7 % (ref 11.5–15.5)
WBC: 8.2 10*3/uL (ref 4.0–10.5)
nRBC: 0 % (ref 0.0–0.2)

## 2022-09-01 LAB — URINALYSIS, MICROSCOPIC (REFLEX)

## 2022-09-01 LAB — GLUCOSE, CAPILLARY
Glucose-Capillary: 122 mg/dL — ABNORMAL HIGH (ref 70–99)
Glucose-Capillary: 152 mg/dL — ABNORMAL HIGH (ref 70–99)
Glucose-Capillary: 161 mg/dL — ABNORMAL HIGH (ref 70–99)
Glucose-Capillary: 192 mg/dL — ABNORMAL HIGH (ref 70–99)

## 2022-09-01 LAB — URINALYSIS, ROUTINE W REFLEX MICROSCOPIC
Bilirubin Urine: NEGATIVE
Glucose, UA: NEGATIVE mg/dL
Ketones, ur: NEGATIVE mg/dL
Nitrite: NEGATIVE
Protein, ur: NEGATIVE mg/dL
Specific Gravity, Urine: 1.025 (ref 1.005–1.030)
pH: 5 (ref 5.0–8.0)

## 2022-09-01 LAB — BASIC METABOLIC PANEL
Anion gap: 10 (ref 5–15)
BUN: 56 mg/dL — ABNORMAL HIGH (ref 8–23)
CO2: 16 mmol/L — ABNORMAL LOW (ref 22–32)
Calcium: 7.5 mg/dL — ABNORMAL LOW (ref 8.9–10.3)
Chloride: 115 mmol/L — ABNORMAL HIGH (ref 98–111)
Creatinine, Ser: 3.85 mg/dL — ABNORMAL HIGH (ref 0.44–1.00)
GFR, Estimated: 11 mL/min — ABNORMAL LOW (ref >60)
Glucose, Bld: 120 mg/dL — ABNORMAL HIGH (ref 70–99)
Potassium: 4.1 mmol/L (ref 3.5–5.1)
Sodium: 141 mmol/L (ref 135–145)

## 2022-09-01 LAB — SODIUM, URINE, RANDOM: Sodium, Ur: 18 mmol/L

## 2022-09-01 LAB — MAGNESIUM: Magnesium: 2.3 mg/dL (ref 1.7–2.4)

## 2022-09-01 MED ORDER — INSULIN GLARGINE-YFGN 100 UNIT/ML ~~LOC~~ SOLN
6.0000 [IU] | Freq: Every day | SUBCUTANEOUS | Status: DC
  Administered 2022-09-01: 6 [IU] via SUBCUTANEOUS
  Filled 2022-09-01 (×2): qty 0.06

## 2022-09-01 MED ORDER — SODIUM BICARBONATE 8.4 % IV SOLN
INTRAVENOUS | Status: DC
  Filled 2022-09-01: qty 150
  Filled 2022-09-01 (×2): qty 1000

## 2022-09-01 NOTE — TOC Progression Note (Signed)
Transition of Care [facility]) - Progression Note    Patient Details  Name: [name] MRN: [id number] Date of Birth: Jan 12, 1941  Transition of Care [facility] CM/SW [place], RN Phone Number:[phone]  09/01/2022, 8:45 AM  Clinical Narrative:    TOC acknowledges general consult for home health/DME/SNF needs/ TOC will follow and workup per therapy recommendations.         Expected Discharge Plan and Services                                                 Social Determinants of Health (SDOH) Interventions    Readmission Risk Interventions     No data to display

## 2022-09-01 NOTE — Progress Notes (Signed)
PT Cancellation Note  Patient Details Name: [name] MRN: [id number] DOB: 03/02/1941   Cancelled Treatment:    Reason Eval/Treat Not Completed: Patient declined, no reason specified, pt declining all mobility despite max encouragement, stating "rough morning". Will check back as schedule allows to continue with PT POC.  Audry Riles. PTA Acute Rehabilitation Services Office: [place] 09/01/2022, 10:33 AM

## 2022-09-01 NOTE — Consult Note (Signed)
[name] Admit Date: 08/29/2022 09/01/2022 Rexene Agent Requesting Physician:  Starla Link MD  Reason for Consult:  AKI on CKD3b HPI:  63F PMH including A-fib on apixaban, COPD, CAD, DM 2 on Jardiance, hypertension on ARB and torsemide, hyperlipidemia, HFpEF, history of AML uterine and breast cancer, seizure disorder who presented on 9/22 to the ED after a mechanical fall at work.  She works in the [employer].  She was found to have a right hip fracture and underwent surgical repair on 9/23 with orthopedics.  Presenting creatinine was 2.4, trended below, worsened to 3.8 today.  Additionally serum bicarbonate is 16 with an anion gap of 10, K of 4.1.  Renal ultrasound 9/24 with medical renal disease, 10.8 cm right, 9.5 cm left kidney with no evidence of acute obstruction or mass.  No urine analysis from presentation onwards.  No identified NSAID use.  No other obvious nephrotoxins.  CK not checked but patient states was not lying on the ground very long.  Urine output of 0.6 L today, she did poorly with in and out catheterization and now has a Foley catheter draining clear yellow urine.  She is receiving D5 with 3 A of sodium bicarbonate at 125 mL/h.  No dyspnea, peripheral edema, chest pain.   Creatinine (mg/dL)  Date Value  01/22/2022 1.53 (H)  09/23/2021 1.45 (H)  06/03/2021 1.74 (H)  05/02/2021 1.29 (H)  02/18/2021 1.52 (H)  01/01/2021 1.62 (H)  10/10/2020 1.81 (H)  03/29/2019 1.48 (H)  11/16/2018 1.24 (H)  08/16/2018 2.32 (H)  09/04/2017 1.4 (H)  03/06/2017 1.4 (H)  12/05/2016 1.4 (H)  08/25/2016 1.4 (H)  05/30/2016 1.4 (H)  02/29/2016 1.7 (H)  11/23/2015 1.4 (H)  08/27/2015 1.4 (H)  05/24/2015 1.6 (H)  02/26/2015 1.5 (H)   Creatinine, Ser (mg/dL)  Date Value  09/01/2022 3.85 (H)  08/31/2022 3.24 (H)  08/30/2022 2.56 (H)  08/29/2022 2.41 (H)  05/19/2022 1.49 (H)  05/08/2022 1.92 (H)  04/21/2022 1.76 (H)  04/20/2022 1.63 (H)  04/19/2022 1.50 (H)  04/18/2022 1.28  (H)  ] I/Os: I/O last 3 completed shifts: In: 1883 [I.V.:1883] Out: -    ROS NSAIDS: No identified exposure IV Contrast no exposure TMP/SMX no use Hypotension no identified exposure Balance of 12 systems is negative w/ exceptions as above  PMH  Past Medical History:  Diagnosis Date   Acute myelogenous leukemia ([place]) 02/2014   Acute pancreatitis    Arthritis    "back" (09/17/2017)   Atrial flutter (HCC)    Cardiomyopathy ([place])    Chronic kidney disease, stage II (mild)    Chronic lower back pain    Colon polyps 04/29/2010   TUBULAR ADENOMA AND A SERRATED ADENOMA   COPD (chronic obstructive pulmonary disease) ([place])    CORONARY ARTERY DISEASE 12/24/2007   DIABETES MELLITUS, TYPE II 07/13/2007   History of blood transfusion 2015   "related to leukemia"   History of kidney stones 12/24/2007   History of uterine cancer    HYPERLIPIDEMIA 12/24/2007   HYPERTENSION 07/13/2007   NSTEMI (non-ST elevated myocardial infarction) ([place]) 09/17/2017   Uterine cancer ([place])    PSH  Past Surgical History:  Procedure Laterality Date   ABDOMINAL HYSTERECTOMY     "still have my ovaries"   BREAST BIOPSY Left 09/22/2019   BREAST LUMPECTOMY Left 10/24/2019   BREAST LUMPECTOMY WITH RADIOACTIVE SEED LOCALIZATION Left 10/24/2019   Procedure: LEFT BREAST LUMPECTOMY WITH RADIOACTIVE SEED LOCALIZATION;  Surgeon: Jovita Kussmaul, MD;  Location: [facility];  Service: General;  Laterality: Left;   CARDIAC CATHETERIZATION  2002   CARDIAC CATHETERIZATION  09/17/2017   CATARACT EXTRACTION W/ INTRAOCULAR LENS  IMPLANT, BILATERAL Bilateral    COLONOSCOPY W/ BIOPSIES AND POLYPECTOMY  2011   CORONARY STENT INTERVENTION N/A 09/21/2017   Procedure: CORONARY STENT INTERVENTION;  Surgeon: Troy Sine, MD;  Location: [facility];  Service: Cardiovascular;  Laterality: N/A;   DILATION AND CURETTAGE OF UTERUS     ESOPHAGOGASTRODUODENOSCOPY N/A 01/30/2022   Procedure:  ESOPHAGOGASTRODUODENOSCOPY (EGD);  Surgeon: Milus Banister, MD;  Location: [facility];  Service: Endoscopy;  Laterality: N/A;   EUS N/A 01/30/2022   Procedure: UPPER ENDOSCOPIC ULTRASOUND (EUS) RADIAL;  Surgeon: Milus Banister, MD;  Location: [facility];  Service: Endoscopy;  Laterality: N/A;   EXCISIONAL HEMORRHOIDECTOMY  X 2   FINE NEEDLE ASPIRATION N/A 01/30/2022   Procedure: FINE NEEDLE ASPIRATION (FNA) LINEAR;  Surgeon: Milus Banister, MD;  Location: [facility];  Service: Endoscopy;  Laterality: N/A;   GANGLION CYST EXCISION Left X 3   INTRAVASCULAR PRESSURE WIRE/FFR STUDY N/A 09/17/2017   Procedure: INTRAVASCULAR PRESSURE WIRE/FFR STUDY;  Surgeon: Nelva Bush, MD;  Location: [facility];  Service: Cardiovascular;  Laterality: N/A;   NASAL SINUS SURGERY     PORTA CATH INSERTION Right 2013   RIGHT/LEFT HEART CATH AND CORONARY ANGIOGRAPHY N/A 09/17/2017   Procedure: RIGHT/LEFT HEART CATH AND CORONARY ANGIOGRAPHY;  Surgeon: Nelva Bush, MD;  Location: [facility];  Service: Cardiovascular;  Laterality: N/A;   TUBAL LIGATION     FH  Family History  Problem Relation Age of Onset   COPD Father    Esophageal cancer Father    Breast cancer Sister 74   Lung cancer Sister    Esophageal cancer Brother    Suicidality Brother    Lung cancer Sister    Lung cancer Sister    Cervical cancer Sister    Congestive Heart Failure Mother    Heart attack Maternal Grandmother    Skin cancer Maternal Grandfather    Heart attack Maternal Grandfather    Stroke Paternal Grandmother 39   Heart attack Paternal Grandfather    Cancer Maternal Uncle        unknown type, dx. >65   Cancer Paternal Aunt        unknown type, dx. >50   Cancer Paternal Uncle        unknown type, dx. >50   Cirrhosis Maternal Uncle    Skin cancer Daughter    Leukemia Cousin        maternal first cousin; dx. >50, in remission   Cancer Niece        unknown type behind her ear, dx. 30s/40s    Colon cancer Neg Hx    SH  reports that she quit smoking about 21 years ago. Her smoking use included cigarettes. She has a 20.00 pack-year smoking history. She has never used smokeless tobacco. She reports that she does not drink alcohol and does not use drugs. Allergies No Known Allergies Home medications Prior to Admission medications   Medication Sig Start Date End Date Taking? Authorizing Provider  apixaban (ELIQUIS) 2.5 MG TABS tablet Take 1 tablet (2.5 mg total) by mouth 2 (two) times daily. 04/21/22  Yes Domenic Polite, MD  atorvastatin (LIPITOR) 40 MG tablet TAKE 1 TABLET EVERY DAY Patient taking differently: Take 40 mg by mouth every evening. 12/30/21  Yes Isaac Bliss, Rayford Halsted, MD  clopidogrel (PLAVIX) 75 MG tablet TAKE 1 TABLET EVERY EVENING Patient taking differently: Take 75 mg by mouth every evening. 12/30/21  Yes Isaac Bliss, Rayford Halsted, MD  cyanocobalamin (VITAMIN B12) 1000 MCG/ML injection INJECT 1ML IN THE MUSCLE EVERY 30 DAYS AS DIRECTED 08/28/22  Yes Isaac Bliss, Rayford Halsted, MD  empagliflozin (JARDIANCE) 25 MG TABS tablet Take 1 tablet (25 mg total) by mouth daily. 05/08/22  Yes Simmons, Brittainy M, PA-C  fluticasone furoate-vilanterol (BREO ELLIPTA) 200-25 MCG/ACT AEPB Inhale 1 puff into the lungs as needed (wheezing SOB).   Yes [provider]  HYDROcodone-acetaminophen (NORCO) 5-325 MG tablet Take 1 tablet by mouth every 6 (six) hours as needed for moderate pain. 08/07/22  Yes Isaac Bliss, Rayford Halsted, MD  insulin aspart (NOVOLOG FLEXPEN) 100 UNIT/ML FlexPen Max daily 45 units 08/13/22  Yes Shamleffer, Melanie Crazier, MD  insulin glargine, 2 Unit Dial, (TOUJEO MAX SOLOSTAR) 300 UNIT/ML Solostar Pen Inject 16 Units into the skin at bedtime. 08/13/22  Yes Shamleffer, Melanie Crazier, MD  isosorbide mononitrate (IMDUR) 30 MG 24 hr tablet Take 1 tablet (30 mg total) by mouth daily. TAKE 1 AND 1/2 TABLETS EVERY DAY 04/21/22  Yes Domenic Polite, MD  letrozole  [facility]) 2.5 MG tablet Take 1 tablet (2.5 mg total) by mouth daily. 02/14/22  Yes Owens Shark, NP  levETIRAcetam (KEPPRA) 500 MG tablet Take 1 tablet (500 mg total) by mouth 2 (two) times daily. 01/20/22  Yes Alric Ran, MD  metoprolol succinate (TOPROL-XL) 25 MG 24 hr tablet Take 0.5 tablets (12.5 mg total) by mouth daily. Take with or immediately following a meal. 05/08/22  Yes Simmons, Brittainy M, PA-C  nitroGLYCERIN (NITROSTAT) 0.4 MG SL tablet PLACE 1 TABLET (0.4 MG TOTAL) UNDER THE TONGUE EVERY 5 (FIVE) MINUTES AS NEEDED FOR CHEST PAIN. 05/23/21  Yes Isaac Bliss, Rayford Halsted, MD  potassium chloride (KLOR-CON) 10 MEQ tablet Take 1 tablet by mouth twice daily for 3 days, then resume once daily dosing thereafter 05/19/22  Yes Nahser, Wonda Cheng, MD  torsemide (DEMADEX) 20 MG tablet Take 2 tablets ($RemoveBe'40mg'HADaBiMPT$ ) by mouth twice daily for 3 days, then resume once daily dosing thereafter 05/19/22  Yes Nahser, Wonda Cheng, MD  valsartan (DIOVAN) 80 MG tablet Take 1 tablet (80 mg total) by mouth daily. 05/19/22  Yes Nahser, Wonda Cheng, MD  Blood Glucose Monitoring Suppl (TRUE METRIX AIR GLUCOSE METER) w/Device KIT USE AS DIRECTED 08/12/22   Isaac Bliss, Rayford Halsted, MD  Insulin Pen Needle 32G X 4 MM MISC 1 Device by Does not apply route in the morning, at noon, in the evening, and at bedtime. 08/13/22   Shamleffer, Melanie Crazier, MD  lidocaine 4 % Place 1 patch onto the skin daily. 04/21/22   Domenic Polite, MD  lipase/protease/amylase (CREON) 36000 UNITS CPEP capsule Take 2 capsules (72,000 Units total) by mouth 3 (three) times daily with meals. Patient taking differently: Take 72,000 Units by mouth as needed. 12/17/21   Esterwood, Amy S, PA-C  LORazepam (ATIVAN) 0.5 MG tablet TAKE 1 TABLET BY MOUTH EVERY 8 HOURS AS NEEDED FOR ANXIETY(DO NOT EXCEED MORE THAN 2 TABS IN 24 HRS) 08/07/22   Isaac Bliss, Rayford Halsted, MD  MAGNESIUM OXIDE 400 PO Take 400 mg by mouth 2 (two) times daily. 06/08/18   [provider]   polyethylene glycol (MIRALAX / GLYCOLAX) 17 g packet Take 17 g by mouth daily as needed for mild constipation.    [provider]  potassium chloride (KLOR-CON) 10 MEQ  tablet TAKE 1 TABLET EVERY DAY Patient taking differently: Take 10 mEq by mouth daily. 12/30/21   Isaac Bliss, Rayford Halsted, MD  SYRINGE-NEEDLE, DISP, 3 ML (BD SAFETYGLIDE SYRINGE/NEEDLE) 25G X 1" 3 ML MISC Use for B12 injections 03/05/22   Isaac Bliss, Rayford Halsted, MD  torsemide (DEMADEX) 20 MG tablet Take 2 tablets (40 mg total) by mouth daily. Patient not taking: Reported on 08/29/2022 04/21/22   Domenic Polite, MD  TRUE METRIX BLOOD GLUCOSE TEST test strip TEST BLOOD SUGAR THREE TIMES DAILY 08/12/22   Isaac Bliss, Rayford Halsted, MD    Current Medications Scheduled Meds:  acetaminophen  1,000 mg Oral Q8H   apixaban  2.5 mg Oral BID   atorvastatin  40 mg Oral Daily   Chlorhexidine Gluconate Cloth  6 each Topical Q0600   docusate sodium  100 mg Oral BID   feeding supplement (GLUCERNA SHAKE)  237 mL Oral BID BM   insulin aspart  0-15 Units Subcutaneous TID WC   insulin aspart  0-5 Units Subcutaneous QHS   insulin glargine-yfgn  6 Units Subcutaneous QHS   levETIRAcetam  500 mg Oral BID   multivitamin with minerals  1 tablet Oral Daily   sodium chloride flush  10-40 mL Intracatheter Q12H   Vitamin D (Ergocalciferol)  50,000 Units Oral Q7 days   Continuous Infusions:  methocarbamol (ROBAXIN) IV     sodium bicarbonate 150 mEq in dextrose 5 % 1,150 mL infusion 125 mL/hr at 09/01/22 1044   PRN Meds:.alum & mag hydroxide-simeth, bisacodyl, HYDROmorphone (DILAUDID) injection, LORazepam, menthol-cetylpyridinium **OR** phenol, methocarbamol **OR** methocarbamol (ROBAXIN) IV, metoCLOPramide **OR** metoCLOPramide (REGLAN) injection, ondansetron **OR** ondansetron (ZOFRAN) IV, mouth rinse, oxyCODONE, polyethylene glycol, sodium chloride flush  CBC Recent Labs  Lab 08/30/22 0400 08/31/22 0533 09/01/22 0540  WBC 7.7 7.5  8.2  NEUTROABS  --  6.0  --   HGB 11.1* 9.2* 8.9*  HCT 34.2* 29.9* 27.7*  MCV 94.5 96.5 95.8  PLT 114* 89* 97*   Basic Metabolic Panel Recent Labs  Lab 08/29/22 1422 08/30/22 0400 08/31/22 0533 09/01/22 0540  NA 140 141 141 141  K 3.5 3.7 3.8 4.1  CL 116* 114* 117* 115*  CO2 16* 16* 18* 16*  GLUCOSE 242* 198* 162* 120*  BUN 49* 50* 51* 56*  CREATININE 2.41* 2.56* 3.24* 3.85*  CALCIUM 7.3* 7.7* 7.5* 7.5*    Physical Exam   Blood pressure 127/61, pulse 71, temperature (!) 97.5 F (36.4 C), temperature source Oral, resp. rate 17, height 5' 2.01" (1.575 m), weight 81.1 kg, SpO2 99 %. GEN: NAD, elderly, conversant ENT: NCAT EYES: EOMI CV: Regular, normal S1 and S2 PULM: Clear bilaterally ABD: Soft, nontender SKIN: No identified rashes or lesions EXT: No peripheral edema GU Foley catheter Nonfocal, CN II through XII grossly intact  Assessment 83F AoCKD3 in setting of hip fracture and surgical repair 9/23  AKI on CKD3b BL SCr 1.6; no outpt nephrologist Renal US 9/24 no acute/obstructive findings No UA from admission onwards ?hypovolemia, ? ATN Holding torsemide and valsartan home meds Rec IVFs at this time No RRT indications NAGMA probably from #1, on NaHCO3 gtt, CTM S/p R hip fx and repair 9/23 ANemia, mild Mild TCP AFib on apixaban DM2, SGLT2 held HTN ARB and diuretic held COPD HFpEF Hx/o AML, uterine and breast Ca  Plan As above Check UA, U Na Cont supportie care Cont IVFs Cont with Foley catheter Daily weights, Daily Renal Panel, Strict I/Os, Avoid nephrotoxins (NSAIDs, judicious IV Contrast)W Will follow  along   Rexene Agent, MD  09/01/2022, 1:45 PM

## 2022-09-01 NOTE — Progress Notes (Addendum)
Inpatient Rehab Coordinator Note:  I met with patient at bedside to discuss CIR recommendations and goals/expectations of CIR stay.  We reviewed 3 hrs/day of therapy, physician follow up, and average length of stay 2 weeks (dependent upon progress) with goals of mod I. Patient is interested in pursuing potential CIR admission. She will need to demonstrate tolerance for therapy at this level prior to submitting insurance authorization. Insurance may not approve but will, submit if appears to be a potential candidate. Will continue to follow.   Rehab Admissons Coordinator [place], [place], [place] [phone]

## 2022-09-01 NOTE — Progress Notes (Signed)
Patient did not void all night. She did have the urge on two occassions, and it went away. Bladder scan showed 560. I&O order obtained.   3 attempts made with I&O without success.

## 2022-09-01 NOTE — Care Management Important Message (Signed)
Important Message  Patient Details  Name: [name] MRN: [id number] Date of Birth: 1941/04/12   Medicare Important Message Given:  Yes     Orbie Pyo 09/01/2022, 2:25 PM

## 2022-09-01 NOTE — Progress Notes (Signed)
PROGRESS NOTE    [name]  ASN:[id number] DOB: Dec 05, 1941 DOA: 08/29/2022 PCP: Isaac Bliss, Rayford Halsted, MD   Brief Narrative:  81 y.o. female with medical history significant of AML (remote); afib on Eliquis; chronic back pain; COPD; CAD; DM; HTN; HLD; chronic diastolic CHF; uterine/breast cancer; and CAD presented with a fall and was found to have right hip fracture.  CT of the head was negative for acute intracranial abnormity.  Orthopedics was consulted.  She underwent surgical intervention on 08/30/2022.  Assessment & Plan:   Right hip fracture after mechanical fall -underwent surgical intervention on 08/30/2022. -Follow further orthopedics recommendations.  Continue pain management.  Eliquis has been resumed. -Fall precautions -PT/OT recommend CIR. CIR consulted  Persistent A-fib -Mild intermittent bradycardia noted.  Eliquis has been resumed postprocedure.  Chronic diastolic CHF -Echo on 1/93/7902 had shown EF of 60 to 65%.  Currently showing no signs of volume overload.  Strict input and output.  Daily weights. -Losartan and Jardiance on hold.  Outpatient follow-up with cardiology  History of AML in remission and recent breast cancer -Outpatient follow-up with oncology/Dr. [name].  Letrozole on hold for now.  Seizure disorder -Continue Keppra  Acute kidney injury on chronic kidney disease stage IV Acute metabolic acidosis -Baseline creatinine around 1.5-1.9.  Creatinine has worsened to 3.85 this morning.  Switch IV fluids to bicarb drip.  Renal ultrasound negative for hydronephrosis.  Repeat a.m. labs  Acute urinary retention -Possibly from recent surgery and immobility.  3 unsuccessful attempts at in and out cath overnight.  Will try and place Foley catheter by nursing staff.  If unsuccessful, will consult urology.  Anemia of chronic disease--possibly from renal failure.  Hemoglobin stable.  Monitor intermittently  Thrombocytopenia -Questionable cause.  Monitor  intermittently.  No signs of bleeding currently.  Diabetes mellitus type II with hyperglycemia and hypoglycemia -Has had episodes of hypoglycemia.  Decrease dose of acting insulin.  Continue CBGs with SSI  Hyperlipidemia -Continue statin  Hypertension -Blood pressure intermittently on the lower side.  DC Imdur.  Amlodipine and metoprolol on hold for now  CAD -Status post history of stenting -no chest pain. -Continue Imdur.  Resume Plavix postprocedure once cleared by orthopedics  COPD -Stable.  Continue albuterol as needed  Chronic pain -Continue current pain management.  Outpatient follow-up with PCP/pain management  DVT prophylaxis: SCDs Code Status: Full Family Communication: None at bedside Disposition Plan: Status is: Inpatient Remains inpatient appropriate because: Of severity of illness. may need SNF placement.  Consultants: Orthopedics  Procedures: Intramedullary nailing of the right hip on 08/30/2022 Antimicrobials: Perioperative   Subjective: Patient seen and examined at bedside.  No fever, agitation, vomiting reported.  Patient had episodes of hypoglycemia yesterday as per nursing staff.  Patient also has had urinary retention as per nursing staff with 3 unsuccessful attempts at in and out catheterization overnight. Objective: Vitals:   08/31/22 1925 09/01/22 0016 09/01/22 0418 09/01/22 0658  BP: 115/62 (!) 113/52 (!) 124/47   Pulse: (!) 51 66 67 79  Resp: '11 11 16 15  '$ Temp: 98 F (36.7 C) 98.2 F (36.8 C) (!) 97.5 F (36.4 C)   TempSrc: Oral Oral    SpO2:  (!) 88% 92% (!) 87%  Weight:    81.1 kg  Height:        Intake/Output Summary (Last 24 hours) at 09/01/2022 0711 Last data filed at 08/31/2022 1800 Gross per 24 hour  Intake 1883.04 ml  Output --  Net 1883.04 ml  Filed Weights   08/30/22 0753 08/31/22 0332 09/01/22 0658  Weight: 72.5 kg 80.4 kg 81.1 kg    Examination:  General: No acute distress currently.  On 2 to 3 L oxygen via nasal  cannula.  Elderly female sitting in chair. ENT/neck: No obvious JVD elevation or neck masses palpable respiratory: Bilateral decreased breath sounds at bases with scattered crackles and intermittent tachypnea  CVS: Rate mostly controlled; S1 and S2 are heard Abdominal: Soft, nontender, has mild distention; no organomegaly, bowel sounds are heard normally Extremities: No clubbing: Mild lower extremity edema present CNS: Alert; still slow to respond.  No focal neurologic deficit.  Able to move extremities lymph: No palpable cervical lymphadenopathy Skin: No obvious petechiae/lesions  psych: Flat affect mostly.  Not agitated currently.   Musculoskeletal: No obvious other joint swelling/deformity.  Right hip tenderness present    Data Reviewed: I have personally reviewed following labs and imaging studies  CBC: Recent Labs  Lab 08/29/22 1422 08/30/22 0400 08/31/22 0533 09/01/22 0540  WBC 9.5 7.7 7.5 8.2  NEUTROABS  --   --  6.0  --   HGB 11.1* 11.1* 9.2* 8.9*  HCT 33.6* 34.2* 29.9* 27.7*  MCV 92.3 94.5 96.5 95.8  PLT 120* 114* 89* 97*    Basic Metabolic Panel: Recent Labs  Lab 08/29/22 1422 08/30/22 0400 08/31/22 0533 09/01/22 0540  NA 140 141 141 141  K 3.5 3.7 3.8 4.1  CL 116* 114* 117* 115*  CO2 16* 16* 18* 16*  GLUCOSE 242* 198* 162* 120*  BUN 49* 50* 51* 56*  CREATININE 2.41* 2.56* 3.24* 3.85*  CALCIUM 7.3* 7.7* 7.5* 7.5*  MG  --   --  2.1 2.3    GFR: Estimated Creatinine Clearance: 11.5 mL/min (A) (by C-G formula based on SCr of 3.85 mg/dL (H)). Liver Function Tests: No results for input(s): "AST", "ALT", "ALKPHOS", "BILITOT", "PROT", "ALBUMIN" in the last 168 hours. No results for input(s): "LIPASE", "AMYLASE" in the last 168 hours. No results for input(s): "AMMONIA" in the last 168 hours. Coagulation Profile: No results for input(s): "INR", "PROTIME" in the last 168 hours. Cardiac Enzymes: No results for input(s): "CKTOTAL", "CKMB", "CKMBINDEX",  "TROPONINI" in the last 168 hours. BNP (last 3 results) No results for input(s): "PROBNP" in the last 8760 hours. HbA1C: No results for input(s): "HGBA1C" in the last 72 hours. CBG: Recent Labs  Lab 08/31/22 1343 08/31/22 1445 08/31/22 1608 08/31/22 2118 09/01/22 0614  GLUCAP 59* 94 80 82 122*    Lipid Profile: No results for input(s): "CHOL", "HDL", "LDLCALC", "TRIG", "CHOLHDL", "LDLDIRECT" in the last 72 hours. Thyroid Function Tests: No results for input(s): "TSH", "T4TOTAL", "FREET4", "T3FREE", "THYROIDAB" in the last 72 hours. Anemia Panel: No results for input(s): "VITAMINB12", "FOLATE", "FERRITIN", "TIBC", "IRON", "RETICCTPCT" in the last 72 hours. Sepsis Labs: No results for input(s): "PROCALCITON", "LATICACIDVEN" in the last 168 hours.  Recent Results (from the past 240 hour(s))  MRSA Next Gen by PCR, Nasal     Status: None   Collection Time: 08/29/22  9:30 PM   Specimen: Nasal Mucosa; Nasal Swab  Result Value Ref Range Status   MRSA by PCR Next Gen NOT DETECTED NOT DETECTED Final    Comment: (NOTE) The GeneXpert MRSA Assay (FDA approved for NASAL specimens only), is one component of a comprehensive MRSA colonization surveillance program. It is not intended to diagnose MRSA infection nor to guide or monitor treatment for MRSA infections. Test performance is not FDA approved in patients less than 2  years old. Performed at [facility], [place] [address], [place], [place] [address]          Radiology Studies: US RENAL  Result Date: 08/31/2022 CLINICAL DATA:  Renal failure EXAM: RENAL / URINARY TRACT ULTRASOUND COMPLETE COMPARISON:  None Available. FINDINGS: Right Kidney: Renal measurements: 10.8 x 5.7 x 5.2 cm = volume: 160 a mL. Contains a 1.7 cm cyst. No follow-up imaging recommended for the cyst. Subjectively thinned cortex. Increased cortical echogenicity. Left Kidney: Renal measurements: 9.5 x 5.1 x 5.7 cm = volume: 145 mL. Subjectively thinned cortex.  Increased cortical echogenicity. Bladder: Appears normal for degree of bladder distention. Other: None. IMPRESSION: Medical renal disease with increased cortical echogenicity bilaterally. No other significant abnormalities. Electronically Signed   By: Dorise Bullion III M.D.   On: 08/31/2022 13:06   DG Hand Complete Right  Result Date: 08/30/2022 CLINICAL DATA:  Proximal thumb pain. EXAM: RIGHT HAND - COMPLETE 3+ VIEW COMPARISON:  None Available. FINDINGS: There is no evidence of fracture or dislocation. Osteoarthritic changes at the first carpo/metacarpal joint with joint space narrowing and subchondral sclerosis. IMPRESSION: Osteoarthritic changes at the first carpo/metacarpal joint. Electronically Signed   By: Fidela Salisbury M.D.   On: 08/30/2022 15:02   DG HIP UNILAT WITH PELVIS 2-3 VIEWS RIGHT  Result Date: 08/30/2022 CLINICAL DATA:  Postop. EXAM: DG HIP (WITH OR WITHOUT PELVIS) 2-3V RIGHT COMPARISON:  Preoperative imaging. FINDINGS: Femoral intramedullary nail is distal locking and trans trochanteric screw fixation of intertrochanteric femur fracture. Fracture line is not well visible, although alignment is unchanged. No new or periprosthetic fracture. Recent postsurgical change includes air and edema in the soft tissues. IMPRESSION: ORIF of right intertrochanteric femur fracture, no immediate postoperative complication. Electronically Signed   By: Keith Rake M.D.   On: 08/30/2022 10:46   DG FEMUR, MIN 2 VIEWS RIGHT  Result Date: 08/30/2022 CLINICAL DATA:  IM nail. EXAM: RIGHT FEMUR 2 VIEWS COMPARISON:  Preoperative radiograph. FINDINGS: Seven fluoroscopic spot views of the right femur obtained in the operating room. Interval intramedullary nail with trans trochanteric and distal locking screw fixation of proximal femur fracture. There is also a single fluoroscopic spot view of the ankle. Fluoroscopy time 67 seconds. Dose 19.32 mGy. IMPRESSION: Intraoperative fluoroscopy during ORIF of  right femur fracture. Electronically Signed   By: Keith Rake M.D.   On: 08/30/2022 10:16        Scheduled Meds:  acetaminophen  1,000 mg Oral Q8H   apixaban  2.5 mg Oral BID   atorvastatin  40 mg Oral Daily   Chlorhexidine Gluconate Cloth  6 each Topical Q0600   docusate sodium  100 mg Oral BID   feeding supplement (GLUCERNA SHAKE)  237 mL Oral BID BM   insulin aspart  0-15 Units Subcutaneous TID WC   insulin aspart  0-5 Units Subcutaneous QHS   insulin glargine-yfgn  12 Units Subcutaneous QHS   isosorbide mononitrate  45 mg Oral Daily   levETIRAcetam  500 mg Oral BID   multivitamin with minerals  1 tablet Oral Daily   sodium chloride flush  10-40 mL Intracatheter Q12H   Vitamin D (Ergocalciferol)  50,000 Units Oral Q7 days   Continuous Infusions:  sodium chloride 100 mL/hr at 08/31/22 1827   methocarbamol (ROBAXIN) IV            Aline August, MD [facility] 09/01/2022, 7:11 AM

## 2022-09-01 NOTE — Inpatient Diabetes Management (Addendum)
Inpatient Diabetes Program Recommendations  AACE/ADA: New Consensus Statement on Inpatient Glycemic Control (2015)  Target Ranges:  Prepandial:   less than 140 mg/dL      Peak postprandial:   less than 180 mg/dL (1-2 hours)      Critically ill patients:  140 - 180 mg/dL    Latest Reference Range & Units 08/13/22 13:17  Hemoglobin A1C 4.0 - 5.6 % 9.9 !  !: Data is abnormal  Latest Reference Range & Units 08/31/22 06:20 08/31/22 11:29 08/31/22 12:11 08/31/22 13:43 08/31/22 14:45 08/31/22 16:08 08/31/22 21:18  Glucose-Capillary 70 - 99 mg/dL 161 (H)  3 units Novolog _0  58 (L) 53 (L) 59 (L) 94 80 82  Semglee HELD per MD order  (H): Data is abnormally high (L): Data is abnormally low  Latest Reference Range & Units 09/01/22 06:14  Glucose-Capillary 70 - 99 mg/dL 122 (H)  (H): Data is abnormally high   Admit with: Hip Fracture  History: DM2, CKD, AML  Home DM Meds: Jardiance 25 mg daily        Novolog Max 45 units        Toujeo 16 units QHS  Current Orders: Novolog Moderate Correction Scale/ SSI (0-15 units) TID AC + HS      Semglee 6 units QHS    Underwent Surgical Repair of Hip 9/23  Hypoglycemia noted afternoon 9/24--Semglee was HELD per MD last PM--Semglee dose reduced for tonight--agree  Endocrinologist: Dr. Kelton Pillar with [facility] Last seen 08/13/2022 Reported to ENDO she was rationing insulin Provided with the [employer] patient assistance program application by ENDO team Was Instructed to take the following: Continue Toujeo 16 units daily  Take Novolog 10  units TID QAC Continue CF: Novolog ( BG - 140/25)   Addendum 11:40am--Met w/ pt at bedside.  Pt appeared sleepy and answered "Yes" to all my questions but did not elaborate much with me when I asked her questions about her rationing insulin at home, inability to afford meds, etc.  Per ENDO visit notes, ENDO was trying to get pt enrolled into the [employer] patient assistance program, and I asked pt about this, but all  she would reply was "Yes" they are working on that.  I tried to verify her home insulin regimen (see above) and pt just replied "Yes" she is taking Toujeo and Novolog.  Pt did not answer my question about who lives in the home with her--Looks like she may have been independent per PT notes prior to hospital stay.  Per chart review, looks like pt may require either CIR or SNF for Rehab after d/c.  Will follow.   --Will follow patient during hospitalization--  Wyn Quaker RN, MSN, [place] Diabetes Coordinator Inpatient Glycemic Control Team Team Pager: [phone] (8a-5p)

## 2022-09-01 NOTE — Progress Notes (Signed)
Inpatient Rehab Admissions Coordinator:    Consult received for potential admit to CIR. Patient refusing PT this am. Will continue to follow to see how she progresses with therapy over next 1-2 days.  Rehab Admissons Coordinator [place], [place], [place] [phone]

## 2022-09-02 ENCOUNTER — Encounter (HOSPITAL_COMMUNITY): Payer: Self-pay | Admitting: Orthopedic Surgery

## 2022-09-02 DIAGNOSIS — I480 Paroxysmal atrial fibrillation: Secondary | ICD-10-CM

## 2022-09-02 DIAGNOSIS — C9201 Acute myeloblastic leukemia, in remission: Secondary | ICD-10-CM

## 2022-09-02 DIAGNOSIS — N184 Chronic kidney disease, stage 4 (severe): Secondary | ICD-10-CM

## 2022-09-02 DIAGNOSIS — N179 Acute kidney failure, unspecified: Secondary | ICD-10-CM

## 2022-09-02 DIAGNOSIS — R338 Other retention of urine: Secondary | ICD-10-CM

## 2022-09-02 DIAGNOSIS — I10 Essential (primary) hypertension: Secondary | ICD-10-CM

## 2022-09-02 DIAGNOSIS — S72001A Fracture of unspecified part of neck of right femur, initial encounter for closed fracture: Secondary | ICD-10-CM

## 2022-09-02 LAB — BASIC METABOLIC PANEL
Anion gap: 7 (ref 5–15)
BUN: 61 mg/dL — ABNORMAL HIGH (ref 8–23)
CO2: 21 mmol/L — ABNORMAL LOW (ref 22–32)
Calcium: 7.1 mg/dL — ABNORMAL LOW (ref 8.9–10.3)
Chloride: 112 mmol/L — ABNORMAL HIGH (ref 98–111)
Creatinine, Ser: 3.54 mg/dL — ABNORMAL HIGH (ref 0.44–1.00)
GFR, Estimated: 12 mL/min — ABNORMAL LOW (ref >60)
Glucose, Bld: 230 mg/dL — ABNORMAL HIGH (ref 70–99)
Potassium: 3.6 mmol/L (ref 3.5–5.1)
Sodium: 140 mmol/L (ref 135–145)

## 2022-09-02 LAB — CBC WITH DIFFERENTIAL/PLATELET
Abs Immature Granulocytes: 0.02 10*3/uL (ref 0.00–0.07)
Basophils Absolute: 0 10*3/uL (ref 0.0–0.1)
Basophils Relative: 0 %
Eosinophils Absolute: 0.1 10*3/uL (ref 0.0–0.5)
Eosinophils Relative: 3 %
HCT: 27.2 % — ABNORMAL LOW (ref 36.0–46.0)
Hemoglobin: 8.7 g/dL — ABNORMAL LOW (ref 12.0–15.0)
Immature Granulocytes: 0 %
Lymphocytes Relative: 19 %
Lymphs Abs: 1 10*3/uL (ref 0.7–4.0)
MCH: 30.1 pg (ref 26.0–34.0)
MCHC: 32 g/dL (ref 30.0–36.0)
MCV: 94.1 fL (ref 80.0–100.0)
Monocytes Absolute: 0.5 10*3/uL (ref 0.1–1.0)
Monocytes Relative: 10 %
Neutro Abs: 3.7 10*3/uL (ref 1.7–7.7)
Neutrophils Relative %: 68 %
Platelets: 92 10*3/uL — ABNORMAL LOW (ref 150–400)
RBC: 2.89 MIL/uL — ABNORMAL LOW (ref 3.87–5.11)
RDW: 13.6 % (ref 11.5–15.5)
WBC: 5.4 10*3/uL (ref 4.0–10.5)
nRBC: 0 % (ref 0.0–0.2)

## 2022-09-02 LAB — GLUCOSE, CAPILLARY
Glucose-Capillary: 215 mg/dL — ABNORMAL HIGH (ref 70–99)
Glucose-Capillary: 99 mg/dL (ref 70–99)
Glucose-Capillary: 110 mg/dL — ABNORMAL HIGH (ref 70–99)
Glucose-Capillary: 114 mg/dL — ABNORMAL HIGH (ref 70–99)

## 2022-09-02 LAB — MAGNESIUM: Magnesium: 2.5 mg/dL — ABNORMAL HIGH (ref 1.7–2.4)

## 2022-09-02 MED ORDER — IPRATROPIUM-ALBUTEROL 0.5-2.5 (3) MG/3ML IN SOLN
3.0000 mL | Freq: Four times a day (QID) | RESPIRATORY_TRACT | Status: DC | PRN
  Administered 2022-09-02 – 2022-09-04 (×2): 3 mL via RESPIRATORY_TRACT
  Filled 2022-09-02: qty 9
  Filled 2022-09-02: qty 3

## 2022-09-02 MED ORDER — LACTATED RINGERS IV SOLN
INTRAVENOUS | Status: DC
  Administered 2022-09-02: 75 mL/h via INTRAVENOUS

## 2022-09-02 MED ORDER — INSULIN GLARGINE-YFGN 100 UNIT/ML ~~LOC~~ SOLN
8.0000 [IU] | Freq: Every day | SUBCUTANEOUS | Status: DC
  Administered 2022-09-02: 8 [IU] via SUBCUTANEOUS
  Filled 2022-09-02 (×2): qty 0.08

## 2022-09-02 NOTE — Progress Notes (Signed)
PROGRESS NOTE    [name]  QIW:[id number] DOB: 1941-05-06 DOA: 08/29/2022 PCP: Isaac Bliss, Rayford Halsted, MD   Brief Narrative:  81 y.o. female with medical history significant of AML (remote); afib on Eliquis; chronic back pain; COPD; CAD; DM; HTN; HLD; chronic diastolic CHF; uterine/breast cancer; and CAD presented with a fall and was found to have right hip fracture.  CT of the head was negative for acute intracranial abnormity.  Orthopedics was consulted.  She underwent surgical intervention on 08/30/2022.  Subsequently, she was found to have acute kidney injury.  Nephrology was consulted.  Assessment & Plan:   Right hip fracture after mechanical fall -underwent surgical intervention on 08/30/2022. -Follow further orthopedics recommendations.  Continue pain management.  Eliquis has been resumed. -Fall precautions -PT/OT recommend CIR. CIR consulted  Persistent A-fib -Mild intermittent bradycardia noted.  Eliquis has been resumed postprocedure.  Chronic diastolic CHF -Echo on 03/24/4080 had shown EF of 60 to 65%.  Currently showing no signs of volume overload.  Strict input and output.  Daily weights. -Losartan and Jardiance on hold.  Outpatient follow-up with cardiology  History of AML in remission and recent breast cancer -Outpatient follow-up with oncology/Dr. [name].  Letrozole on hold for now.  Seizure disorder -Continue Keppra  Acute kidney injury on chronic kidney disease stage IV Acute metabolic acidosis -Baseline creatinine around 1.5-1.9.  Creatinine improving today to 3.54.  Currently on bicarb drip.  Acidosis improving.  Nephrology following.  Renal ultrasound negative for hydronephrosis.  Repeat a.m. labs  Acute urinary retention -Possibly from recent surgery and immobility.  3 unsuccessful attempts at in and out cath overnight on 09/01/2022.  Foley catheter was subsequently placed.  Anemia of chronic disease--possibly from renal failure.  Hemoglobin stable.   Monitor intermittently  Thrombocytopenia -Questionable cause.  Monitor intermittently.  No signs of bleeding currently.  Diabetes mellitus type II with hyperglycemia and hypoglycemia -Has had episodes of hypoglycemia.  Continue long-acting insulin.  Continue CBGs with SSI  Hyperlipidemia -Continue statin  Hypertension -Blood pressure intermittently on the lower side.  DC'd Imdur on 09/01/2022.  Amlodipine and metoprolol on hold for now  CAD -Status post history of stenting -no chest pain. -Continue Imdur.  Resume Plavix postprocedure once cleared by orthopedics  COPD -Stable.  Continue albuterol as needed  Chronic pain -Continue current pain management.  Outpatient follow-up with PCP/pain management  DVT prophylaxis: SCDs Code Status: Full Family Communication: None at bedside Disposition Plan: Status is: Inpatient Remains inpatient appropriate because: Of severity of illness.  Will need CIR placement.  Consultants: Orthopedics/nephrology  Procedures: Intramedullary nailing of the right hip on 08/30/2022 Antimicrobials: Perioperative   Subjective: Patient seen and examined at bedside.  Poor historian.  No fever, agitation, seizures or vomiting reported.   Objective: Vitals:   09/01/22 1948 09/01/22 2341 09/02/22 0423 09/02/22 0510  BP: (!) 123/44 (!) 97/51 (!) 128/55   Pulse: 71 73 80 77  Resp: (!) '30 16 19 '$ (!) 22  Temp: 98.6 F (37 C) 98.2 F (36.8 C)    TempSrc: Oral Oral    SpO2: 92% 97% 96% 98%  Weight:      Height:        Intake/Output Summary (Last 24 hours) at 09/02/2022 0723 Last data filed at 09/02/2022 0522 Gross per 24 hour  Intake 667.55 ml  Output 1025 ml  Net -357.45 ml    Filed Weights   08/30/22 0753 08/31/22 0332 09/01/22 0658  Weight: 72.5 kg 80.4 kg 81.1 kg  Examination:  General: On room air currently.  No distress.  Elderly female lying in bed.   ENT/neck: No obvious thyromegaly or JVD elevation noted  respiratory: Decreased  breath sounds at bases bilaterally with some crackles and intermittently tachypneic  CVS: S1-S2 heard; rate controlled  abdominal: Soft, nontender, still distended; no organomegaly, normal bowel sounds heard  extremities: Bilateral lower extremity edema present; no cyanosis CNS: Awake; still slow to respond.  Poor historian.  No focal neurologic deficit.  Moving extremities.   Lymph: No obvious lymphadenopathy noted  skin: No obvious ecchymosis/rashes  psych: No signs of agitation.  Affect is mostly flat. Musculoskeletal: No obvious other joint swelling/deformity.  Right hip tenderness present    Data Reviewed: I have personally reviewed following labs and imaging studies  CBC: Recent Labs  Lab 08/29/22 1422 08/30/22 0400 08/31/22 0533 09/01/22 0540 09/02/22 0325  WBC 9.5 7.7 7.5 8.2 5.4  NEUTROABS  --   --  6.0  --  3.7  HGB 11.1* 11.1* 9.2* 8.9* 8.7*  HCT 33.6* 34.2* 29.9* 27.7* 27.2*  MCV 92.3 94.5 96.5 95.8 94.1  PLT 120* 114* 89* 97* 92*    Basic Metabolic Panel: Recent Labs  Lab 08/29/22 1422 08/30/22 0400 08/31/22 0533 09/01/22 0540 09/02/22 0325  NA 140 141 141 141 140  K 3.5 3.7 3.8 4.1 3.6  CL 116* 114* 117* 115* 112*  CO2 16* 16* 18* 16* 21*  GLUCOSE 242* 198* 162* 120* 230*  BUN 49* 50* 51* 56* 61*  CREATININE 2.41* 2.56* 3.24* 3.85* 3.54*  CALCIUM 7.3* 7.7* 7.5* 7.5* 7.1*  MG  --   --  2.1 2.3 2.5*    GFR: Estimated Creatinine Clearance: 12.5 mL/min (A) (by C-G formula based on SCr of 3.54 mg/dL (H)). Liver Function Tests: No results for input(s): "AST", "ALT", "ALKPHOS", "BILITOT", "PROT", "ALBUMIN" in the last 168 hours. No results for input(s): "LIPASE", "AMYLASE" in the last 168 hours. No results for input(s): "AMMONIA" in the last 168 hours. Coagulation Profile: No results for input(s): "INR", "PROTIME" in the last 168 hours. Cardiac Enzymes: No results for input(s): "CKTOTAL", "CKMB", "CKMBINDEX", "TROPONINI" in the last 168 hours. BNP  (last 3 results) No results for input(s): "PROBNP" in the last 8760 hours. HbA1C: No results for input(s): "HGBA1C" in the last 72 hours. CBG: Recent Labs  Lab 09/01/22 0614 09/01/22 1100 09/01/22 1624 09/01/22 2111 09/02/22 0618  GLUCAP 122* 152* 161* 192* 215*    Lipid Profile: No results for input(s): "CHOL", "HDL", "LDLCALC", "TRIG", "CHOLHDL", "LDLDIRECT" in the last 72 hours. Thyroid Function Tests: No results for input(s): "TSH", "T4TOTAL", "FREET4", "T3FREE", "THYROIDAB" in the last 72 hours. Anemia Panel: No results for input(s): "VITAMINB12", "FOLATE", "FERRITIN", "TIBC", "IRON", "RETICCTPCT" in the last 72 hours. Sepsis Labs: No results for input(s): "PROCALCITON", "LATICACIDVEN" in the last 168 hours.  Recent Results (from the past 240 hour(s))  MRSA Next Gen by PCR, Nasal     Status: None   Collection Time: 08/29/22  9:30 PM   Specimen: Nasal Mucosa; Nasal Swab  Result Value Ref Range Status   MRSA by PCR Next Gen NOT DETECTED NOT DETECTED Final    Comment: (NOTE) The GeneXpert MRSA Assay (FDA approved for NASAL specimens only), is one component of a comprehensive MRSA colonization surveillance program. It is not intended to diagnose MRSA infection nor to guide or monitor treatment for MRSA infections. Test performance is not FDA approved in patients less than 32 years old. Performed at [facility], [address], [place],  [address]          Radiology Studies: US RENAL  Result Date: 08/31/2022 CLINICAL DATA:  Renal failure EXAM: RENAL / URINARY TRACT ULTRASOUND COMPLETE COMPARISON:  None Available. FINDINGS: Right Kidney: Renal measurements: 10.8 x 5.7 x 5.2 cm = volume: 160 a mL. Contains a 1.7 cm cyst. No follow-up imaging recommended for the cyst. Subjectively thinned cortex. Increased cortical echogenicity. Left Kidney: Renal measurements: 9.5 x 5.1 x 5.7 cm = volume: 145 mL. Subjectively thinned cortex. Increased cortical echogenicity.  Bladder: Appears normal for degree of bladder distention. Other: None. IMPRESSION: Medical renal disease with increased cortical echogenicity bilaterally. No other significant abnormalities. Electronically Signed   By: Dorise Bullion III M.D.   On: 08/31/2022 13:06        Scheduled Meds:  acetaminophen  1,000 mg Oral Q8H   apixaban  2.5 mg Oral BID   atorvastatin  40 mg Oral Daily   Chlorhexidine Gluconate Cloth  6 each Topical Q0600   docusate sodium  100 mg Oral BID   feeding supplement (GLUCERNA SHAKE)  237 mL Oral BID BM   insulin aspart  0-15 Units Subcutaneous TID WC   insulin aspart  0-5 Units Subcutaneous QHS   insulin glargine-yfgn  6 Units Subcutaneous QHS   levETIRAcetam  500 mg Oral BID   multivitamin with minerals  1 tablet Oral Daily   sodium chloride flush  10-40 mL Intracatheter Q12H   Vitamin D (Ergocalciferol)  50,000 Units Oral Q7 days   Continuous Infusions:  methocarbamol (ROBAXIN) IV     sodium bicarbonate 150 mEq in dextrose 5 % 1,150 mL infusion 125 mL/hr at 09/02/22 0454          Aline August, MD [facility] 09/02/2022, 7:23 AM

## 2022-09-02 NOTE — Progress Notes (Signed)
Inpatient Rehab Admissions Coordinator:    Per therapy, patient demonstrates improved tolerance for intensive rehab today. I spoke with patient's daughter, [name] on phone. Patient and daughter would like to pursue CIR. Daughter verifies that patient lives with her spouse who can provide 24/7 supervision and if additional assist is needed, daughter can help. Will submit information to insurance for prior authorization. Continue to follow for potential CIR admit pending insurance and bed availability.  Rehab Admissons Coordinator [place], [place], [place] [phone]

## 2022-09-02 NOTE — Inpatient Diabetes Management (Signed)
Inpatient Diabetes Program Recommendations  AACE/ADA: New Consensus Statement on Inpatient Glycemic Control (2015)  Target Ranges:  Prepandial:   less than 140 mg/dL      Peak postprandial:   less than 180 mg/dL (1-2 hours)      Critically ill patients:  140 - 180 mg/dL    Latest Reference Range & Units 09/01/22 06:14 09/01/22 11:00 09/01/22 16:24 09/01/22 21:11  Glucose-Capillary 70 - 99 mg/dL 122 (H) 152 (H)  3 units Novolog  161 (H)  3 units Novolog  192 (H)    6 units Semglee  (H): Data is abnormally high  Latest Reference Range & Units 09/02/22 06:18  Glucose-Capillary 70 - 99 mg/dL 215 (H)  (H): Data is abnormally high   Home DM Meds: Jardiance 25 mg daily                              Novolog Max 45 units                              Toujeo 16 units QHS   Current Orders: Novolog Moderate Correction Scale/ SSI (0-15 units) TID AC + HS                            Semglee 6 units QHS     MD--Hypoglycemia noted afternoon 9/24--Semglee dose reduced  Note pt received 6 units Semglee last PM--CBG 215 this AM  May consider slight increase of Semglee for tonight to 8 units QHS       Endocrinologist: Dr. Kelton Pillar with [facility] Last seen 08/13/2022 Reported to ENDO she was rationing insulin Provided with the [employer] patient assistance program application by ENDO team Was Instructed to take the following: Continue Toujeo 16 units daily  Take Novolog 10  units TID QAC Continue CF: Novolog ( BG - 140/25)    --Will follow patient during hospitalization--  Wyn Quaker RN, MSN, [place] Diabetes Coordinator Inpatient Glycemic Control Team Team Pager: [phone] (8a-5p)

## 2022-09-02 NOTE — Progress Notes (Signed)
Admit: 08/29/2022 LOS: 4  59F AoCKD3 in setting of hip fracture and surgical repair 9/23  Subjective:  C/o pain in hip Seen in room Decent UOP  1L SCr 3.85-->3.54, K and HCO3 ok UA w/o hematuria or pyuria or protein   09/25 0701 - 09/26 0700 In: 667.6 [P.O.:125; I.V.:542.6] Out: 1025 [Urine:1025]  Filed Weights   08/30/22 0753 08/31/22 0332 09/01/22 0658  Weight: 72.5 kg 80.4 kg 81.1 kg    Scheduled Meds:  acetaminophen  1,000 mg Oral Q8H   apixaban  2.5 mg Oral BID   atorvastatin  40 mg Oral Daily   Chlorhexidine Gluconate Cloth  6 each Topical Q0600   docusate sodium  100 mg Oral BID   feeding supplement (GLUCERNA SHAKE)  237 mL Oral BID BM   insulin aspart  0-15 Units Subcutaneous TID WC   insulin aspart  0-5 Units Subcutaneous QHS   insulin glargine-yfgn  8 Units Subcutaneous QHS   levETIRAcetam  500 mg Oral BID   multivitamin with minerals  1 tablet Oral Daily   sodium chloride flush  10-40 mL Intracatheter Q12H   Vitamin D (Ergocalciferol)  50,000 Units Oral Q7 days   Continuous Infusions:  lactated ringers 75 mL/hr (09/02/22 1008)   methocarbamol (ROBAXIN) IV     PRN Meds:.alum & mag hydroxide-simeth, bisacodyl, HYDROmorphone (DILAUDID) injection, LORazepam, menthol-cetylpyridinium **OR** phenol, methocarbamol **OR** methocarbamol (ROBAXIN) IV, metoCLOPramide **OR** metoCLOPramide (REGLAN) injection, ondansetron **OR** ondansetron (ZOFRAN) IV, mouth rinse, oxyCODONE, polyethylene glycol, sodium chloride flush  Current Labs: reviewed    Physical Exam:  Blood pressure (!) 117/103, pulse 80, temperature 98.3 F (36.8 C), temperature source Oral, resp. rate (!) 23, height 5' 2.01" (1.575 m), weight 81.1 kg, SpO2 98 %. GEN: NAD, elderly, conversant ENT: NCAT EYES: EOMI CV: Regular, normal S1 and S2 PULM: Clear bilaterally ABD: Soft, nontender SKIN: No identified rashes or lesions EXT: No peripheral edema GU Foley catheter Nonfocal, CN II through XII grossly  intact  A AKI on CKD3b BL SCr 1.6; no outpt nephrologist Renal US 9/24 no acute/obstructive findings UA 9/25 w/o RBC, WBC, protein ?hypovolemia, ? ATN Holding torsemide and valsartan home meds No RRT indications NAGMA probably from #1, improved with IVFs S/p R hip fx and repair 9/23 ANemia, mild Mild TCP AFib on apixaban DM2, SGLT2 held HTN ARB and diuretic held COPD HFpEF Hx/o AML, uterine and breast Ca  P Recovering GFR Change to  LR @ 53m/h for another 24h until adequate PO Daily weights, Daily Renal Panel, Strict I/Os, Avoid nephrotoxins (NSAIDs, judicious IV Contrast)  Medication Issues; Preferred narcotic agents for pain control are hydromorphone, fentanyl, and methadone. Morphine should not be used.  Baclofen should be avoided Avoid oral sodium phosphate and magnesium citrate based laxatives / bowel preps   RRexene Agent MD  09/02/2022, 11:14 AM  Recent Labs  Lab 08/31/22 0533 09/01/22 0540 09/02/22 0325  NA 141 141 140  K 3.8 4.1 3.6  CL 117* 115* 112*  CO2 18* 16* 21*  GLUCOSE 162* 120* 230*  BUN 51* 56* 61*  CREATININE 3.24* 3.85* 3.54*  CALCIUM 7.5* 7.5* 7.1*   Recent Labs  Lab 08/31/22 0533 09/01/22 0540 09/02/22 0325  WBC 7.5 8.2 5.4  NEUTROABS 6.0  --  3.7  HGB 9.2* 8.9* 8.7*  HCT 29.9* 27.7* 27.2*  MCV 96.5 95.8 94.1  PLT 89* 97* 92*

## 2022-09-02 NOTE — PMR Pre-admission (Signed)
PMR Admission Coordinator Pre-Admission Assessment  Patient: [name] is an 81 y.o., female MRN: [id number] DOB: September 26, 1941 Height: 5' 2.01" (157.5 cm) Weight: 81.1 kg  Insurance Information HMO: Yes     PRIMARY: Forensic scientist  (worker's comp)    Case #: [id number]     Subscriber: Patient CM Name: [name], RN CCM     Phone#: [phone] (cell) [phone] ( office)     Fax#: [phone]  -Please send updates weekly- starting 24/2/35 Pre-Cert#: [id number]        The "Data Collection Information Summary" for patients in Inpatient Rehabilitation Facilities with attached "Privacy Act [place] Records" was provided and verbally reviewed with: Patient and Family  Emergency Contact Information Contact Information     Name Relation Home Work Mobile   Robinson Daughter   [phone]   Mozelle, Remlinger [phone]     ellen,wilbert Brother   [phone]   Alphia Moh   [phone]       Current Medical History  Patient Admitting Diagnosis: Hip fracture, acute kidney injury History of Present Illness: [name]. Campus is a 81 year old right-handed female with history of myelogenous leukemia in remission, atrial fibrillation on Eliquis, chronic back pain, seizure disorder maintained on Keppra, COPD, quit smoking 21 years ago,, CAD with stenting maintained on Plavix, diabetes mellitus, hypertension, chronic diastolic congestive heart failure, uterine/breast cancer, CKD stage III.  Per chart review patient lives with spouse.  1 level home one-step to entry.  Independent prior to admission and driving.  She still works Public librarian.  Presented 08/29/2022 after sustaining ground-level fall while at work.  No loss of consciousness.  Cranial CT scan negative for acute changes.  X-rays and imaging revealed right intertrochanteric hip fracture.  Underwent intramedullary nailing of right hip 08/30/2022 per Dr. Marcelino Scot..  Weightbearing as tolerated right  lower extremity.  She continues on chronic Eliquis as prior to admission as well as her Plavix.  Acute blood loss anemia 9.5 and monitored.  Renal services for history of AKI on CKD creatinine baseline 2.41 on discharge peaking at 3.8.  She did initially receive gentle IV fluids.  Renal ultrasound no hydronephrosis and latest creatinine 1.80.  Bouts of urinary retention Foley tube placed 9/25 and placed on Flomax.  Therapy evaluations completed due to patient decreased functional mobility was admitted for a comprehensive rehab program.     Patient's medical record from [facility] has been reviewed by the rehabilitation admission coordinator and physician.  Past Medical History  Past Medical History:  Diagnosis Date   Acute myelogenous leukemia ([place]) 02/2014   Acute pancreatitis    Arthritis    "back" (09/17/2017)   Atrial flutter ([facility])    Cardiomyopathy ([place])    Chronic kidney disease, stage II (mild)    Chronic lower back pain    Colon polyps 04/29/2010   TUBULAR ADENOMA AND A SERRATED ADENOMA   COPD (chronic obstructive pulmonary disease) ([place])    CORONARY ARTERY DISEASE 12/24/2007   DIABETES MELLITUS, TYPE II 07/13/2007   History of blood transfusion 2015   "related to leukemia"   History of kidney stones 12/24/2007   History of uterine cancer    HYPERLIPIDEMIA 12/24/2007   HYPERTENSION 07/13/2007   NSTEMI (non-ST elevated myocardial infarction) ([place]) 09/17/2017   Uterine cancer ([place])     Has the patient had major surgery during 100 days prior to admission? Yes  Family History   family history includes Breast cancer (age of onset: 46) in her sister; COPD  in her father; Cancer in her maternal uncle, niece, paternal aunt, and paternal uncle; Cervical cancer in her sister; Cirrhosis in her maternal uncle; Congestive Heart Failure in her mother; Esophageal cancer in her brother and father; Heart attack in her maternal grandfather, maternal grandmother, and paternal grandfather;  Leukemia in her cousin; Lung cancer in her sister, sister, and sister; Skin cancer in her daughter and maternal grandfather; Stroke (age of onset: 57) in her paternal grandmother; Suicidality in her brother.  Current Medications  Current Facility-Administered Medications:    acetaminophen (TYLENOL) tablet 1,000 mg, 1,000 mg, Oral, Q8H, Ainsley Spinner, PA-C, 1,000 mg at 09/02/22 1455   alum & mag hydroxide-simeth (MAALOX/MYLANTA) 200-200-20 MG/5ML suspension 15 mL, 15 mL, Oral, Q6H PRN, Starla Link, Kshitiz, MD, 15 mL at 09/02/22 1141   apixaban (ELIQUIS) tablet 2.5 mg, 2.5 mg, Oral, BID, Wise, Nason S, RPH, 2.5 mg at 09/02/22 1001   atorvastatin (LIPITOR) tablet 40 mg, 40 mg, Oral, Daily, Ainsley Spinner, PA-C, 40 mg at 09/02/22 1001   bisacodyl (DULCOLAX) EC tablet 5 mg, 5 mg, Oral, Daily PRN, Ainsley Spinner, PA-C   Chlorhexidine Gluconate Cloth 2 % PADS 6 each, 6 each, Topical, Q0600, Ainsley Spinner, PA-C, 6 each at 09/02/22 0610   docusate sodium (COLACE) capsule 100 mg, 100 mg, Oral, BID, Ainsley Spinner, PA-C, 100 mg at 09/02/22 1001   feeding supplement (GLUCERNA SHAKE) (GLUCERNA SHAKE) liquid 237 mL, 237 mL, Oral, BID BM, Alekh, Kshitiz, MD, 237 mL at 08/31/22 1600   HYDROmorphone (DILAUDID) injection 1 mg, 1 mg, Intravenous, Q2H PRN, Ainsley Spinner, PA-C, 1 mg at 09/02/22 0958   insulin aspart (novoLOG) injection 0-15 Units, 0-15 Units, Subcutaneous, TID WC, Ainsley Spinner, PA-C, 5 Units at 09/02/22 6384   insulin aspart (novoLOG) injection 0-5 Units, 0-5 Units, Subcutaneous, QHS, Ainsley Spinner, PA-C   insulin glargine-yfgn (SEMGLEE) injection 8 Units, 8 Units, Subcutaneous, QHS, Alekh, Kshitiz, MD   lactated ringers infusion, , Intravenous, Continuous, Pearson Grippe B, MD, Last Rate: 75 mL/hr at 09/02/22 1500, Infusion Verify at 09/02/22 1500   levETIRAcetam (KEPPRA) tablet 500 mg, 500 mg, Oral, BID, Ainsley Spinner, PA-C, 500 mg at 09/02/22 1001   LORazepam (ATIVAN) tablet 0.5 mg, 0.5 mg, Oral, Q6H PRN, Ainsley Spinner, PA-C,  0.5 mg at 09/01/22 6659   menthol-cetylpyridinium (CEPACOL) lozenge 3 mg, 1 lozenge, Oral, PRN **OR** phenol (CHLORASEPTIC) mouth spray 1 spray, 1 spray, Mouth/Throat, PRN, Ainsley Spinner, PA-C   methocarbamol (ROBAXIN) tablet 500 mg, 500 mg, Oral, Q6H PRN, 500 mg at 08/30/22 1748 **OR** methocarbamol (ROBAXIN) 500 mg in dextrose 5 % 50 mL IVPB, 500 mg, Intravenous, Q6H PRN, Ainsley Spinner, PA-C   metoCLOPramide (REGLAN) tablet 5-10 mg, 5-10 mg, Oral, Q8H PRN **OR** metoCLOPramide (REGLAN) injection 5-10 mg, 5-10 mg, Intravenous, Q8H PRN, Ainsley Spinner, PA-C   multivitamin with minerals tablet 1 tablet, 1 tablet, Oral, Daily, Alekh, Kshitiz, MD, 1 tablet at 09/02/22 1001   ondansetron (ZOFRAN) tablet 4 mg, 4 mg, Oral, Q6H PRN **OR** ondansetron (ZOFRAN) injection 4 mg, 4 mg, Intravenous, Q6H PRN, Ainsley Spinner, PA-C   Oral care mouth rinse, 15 mL, Mouth Rinse, PRN, Ainsley Spinner, PA-C   oxyCODONE (Oxy IR/ROXICODONE) immediate release tablet 5-10 mg, 5-10 mg, Oral, Q4H PRN, Ainsley Spinner, PA-C, 10 mg at 09/02/22 1455   polyethylene glycol (MIRALAX / GLYCOLAX) packet 17 g, 17 g, Oral, Daily PRN, Ainsley Spinner, PA-C   sodium chloride flush (NS) 0.9 % injection 10-40 mL, 10-40 mL, Intracatheter, Q12H, Alekh, Kshitiz, MD, 10 mL at  09/02/22 1001   sodium chloride flush (NS) 0.9 % injection 10-40 mL, 10-40 mL, Intracatheter, PRN, Starla Link, Kshitiz, MD   Vitamin D (Ergocalciferol) (DRISDOL) 1.25 MG (50000 UNIT) capsule 50,000 Units, 50,000 Units, Oral, Q7 days, Starla Link, Kshitiz, MD, 50,000 Units at 08/30/22 1750  Facility-Administered Medications Ordered in Other Encounters:    sodium chloride 0.9 % 1,000 mL with magnesium sulfate 2 g infusion, , Intravenous, Continuous, Ladell Pier, MD, Stopped at 08/04/14 1441  Patients Current Diet:  Diet Order             Diet Carb Modified Fluid consistency: Thin; Room service appropriate? Yes  Diet effective now                   Precautions /  Restrictions Precautions Precautions: Fall Precaution Comments: Watch HR, SpO2 (does not wear baseline) Restrictions Weight Bearing Restrictions: Yes RLE Weight Bearing: Weight bearing as tolerated   Has the patient had 2 or more falls or a fall with injury in the past year? Yes  Prior Activity Level    Prior Functional Level Self Care: Did the patient need help bathing, dressing, using the toilet or eating? Independent  Indoor Mobility: Did the patient need assistance with walking from room to room (with or without device)? Independent  Stairs: Did the patient need assistance with internal or external stairs (with or without device)? Independent  Functional Cognition: Did the patient need help planning regular tasks such as shopping or remembering to take medications? Independent  Patient Information Are you of Hispanic, Latino/a,or Spanish origin?: A. No, not of Hispanic, Latino/a, or Spanish origin What is your race?: A. White Do you need or want an interpreter to communicate with a doctor or health care staff?: 0. No  Patient's Response To:  Health Literacy and Transportation Is the patient able to respond to health literacy and transportation needs?: Yes Health Literacy - How often do you need to have someone help you when you read instructions, pamphlets, or other written material from your doctor or pharmacy?: Never In the past 12 months, has lack of transportation kept you from medical appointments or from getting medications?: No In the past 12 months, has lack of transportation kept you from meetings, work, or from getting things needed for daily living?: No  Home Assistive Devices / [place] Devices/Equipment: Dentures (specify type), Eyeglasses Home Equipment: Conservation officer, nature (2 wheels)  Prior Device Use: Indicate devices/aids used by the patient prior to current illness, exacerbation or injury? None of the above  Current Functional  Level Cognition  Overall Cognitive Status: Within Functional Limits for tasks assessed Orientation Level: Oriented X4    Extremity Assessment (includes Sensation/Coordination)  Upper Extremity Assessment: Overall WFL for tasks assessed, Generalized weakness  Lower Extremity Assessment: Defer to PT evaluation RLE Deficits / Details: s/p R hip IMN with expected post-op pain and weakness; R knee flex/ext 3/5, hip <3/5    ADLs  Overall ADL's : Needs assistance/impaired Eating/Feeding: Set up, Sitting Grooming: Wash/dry hands, Wash/dry face, Set up, Sitting Grooming Details (indicate cue type and reason): sitting EOB Upper Body Bathing: Moderate assistance Lower Body Bathing: Maximal assistance Upper Body Dressing : Minimal assistance, Sitting Upper Body Dressing Details (indicate cue type and reason): extra gown Lower Body Dressing: Maximal assistance, Sitting/lateral leans Lower Body Dressing Details (indicate cue type and reason): unable to perform figure 4 - will need AE training Toilet Transfer: Moderate assistance, +2 for physical assistance, +2 for safety/equipment (stedy) Toilet Transfer Details (  indicate cue type and reason): cues for sequencing with weight shifts Toileting- Clothing Manipulation and Hygiene: Maximal assistance, +2 for safety/equipment, Sit to/from stand Functional mobility during ADLs: Moderate assistance, +2 for physical assistance, +2 for safety/equipment (stedy) General ADL Comments: decreased access to LB for ADL, weezing today    Mobility  Overal bed mobility: Needs Assistance Bed Mobility: Supine to Sit Supine to sit: Mod assist, HOB elevated General bed mobility comments: increased time and effort, modA for RLE management, trunk elevation, and scooting hips to EOB    Transfers  Overall transfer level: Needs assistance Equipment used: Ambulation equipment used Transfers: Sit to/from Stand Sit to Stand: Mod assist, +2 physical assistance, Min  assist Bed to/from chair/wheelchair/BSC transfer type:: Via Lift equipment Step pivot transfers: Mod assist, +2 physical assistance Transfer via Lift Equipment: Stedy General transfer comment: heavy modA+2 to stand from slightly elevated EOB into stedy frame; 5x sit<>stand from stedy frame with minA+1-2, heavy reliance on BUE support, verbal cues for trunk/hip extension to achieve fully standing; modA+2 for eccentric lowering to sit in recliner    Ambulation / Gait / Stairs / Wheelchair Mobility       Posture / Balance Dynamic Sitting Balance Sitting balance - Comments: improved sitting balance Balance Overall balance assessment: Needs assistance Sitting-balance support: No upper extremity supported, Feet supported Sitting balance-Leahy Scale: Fair Sitting balance - Comments: improved sitting balance Standing balance support: Reliant on assistive device for balance, During functional activity Standing balance-Leahy Scale: Poor Standing balance comment: reliant on BUE support standing in stedy frame with min guard    Special needs/care consideration Diabetic management     Previous Home Environment (from acute therapy documentation) Living Arrangements: Spouse/significant other  Lives With: Spouse Available Help at Discharge: Family, Available PRN/intermittently Type of Home: House Home Layout: One level Home Access: Stairs to enter Entrance Stairs-Rails: None Entrance Stairs-Number of Steps: 1 Bathroom Shower/Tub: Chiropodist: Handicapped height Home Care Services: No Additional Comments: reports rolling walker will not fit well in home, husband cannot assist - daughter is HCPOA  Discharge Living Setting Plans for Discharge Living Setting: Patient's home, Lives with (comment) (spouse) Type of Home at Discharge: House Discharge Home Layout: One level Discharge Home Access: Stairs to enter Entrance Stairs-Rails: None Entrance Stairs-Number of Steps:  1 Discharge Bathroom Shower/Tub: Tub/shower unit Discharge Bathroom Toilet: Handicapped height Does the patient have any problems obtaining your medications?: No  Social/Family/Support Systems Patient Roles: Spouse Anticipated Caregiver: spouse, Gwyndolyn Saxon, daughter [name] Anticipated Caregiver's Contact Information: [name] [phone] Ability/Limitations of Caregiver: Spouse can provide 24/7 supervision. Dtr can provide min A if needed PRN Caregiver Availability: 24/7 Discharge Plan Discussed with Primary Caregiver: Yes Is Caregiver In Agreement with Plan?: Yes Does Caregiver/Family have Issues with Lodging/Transportation while Pt is in Rehab?: No  Goals Patient/Family Goal for Rehab: Mod I OT, PT Expected length of stay: 14 days Pt/Family Agrees to Admission and willing to participate: Yes Program Orientation Provided & Reviewed with Pt/Caregiver Including Roles  & Responsibilities: Yes  Barriers to Discharge: Insurance for SNF coverage  Decrease burden of Care through IP rehab admission: Othern/a  Possible need for SNF placement upon discharge: not anticipated  Patient Condition: I have reviewed medical records from [facility], spoken with  [facility] , and patient and daughter. I met with patient at the bedside and discussed via phone for inpatient rehabilitation assessment.  Patient will benefit from ongoing PT and OT, can actively participate in 3 hours of therapy a day 5 days  of the week, and can make measurable gains during the admission.  Patient will also benefit from the coordinated team approach during an Inpatient Acute Rehabilitation admission.  The patient will receive intensive therapy as well as Rehabilitation physician, nursing, social worker, and care management interventions.  Due to safety, skin/wound care, disease management, medication administration, pain management, and patient education the patient requires 24 hour a day rehabilitation nursing.  The patient is currently Mod A  +2 with mobility and basic ADLs.  Discharge setting and therapy post discharge at home with home health is anticipated.  Patient has agreed to participate in the Acute Inpatient Rehabilitation Program and will admit today, 09/05/22.  Preadmission Screen Completed By:  Nelly Laurence, 09/02/2022 3:09 PM ______________________________________________________________________   Discussed status with Dr. Curlene Dolphin on 09/05/22 at 10:00 am and received approval for admission today.  Admission Coordinator:  Nelly Laurence, time 10:58 /Date 09/05/22   Assessment/Plan: Diagnosis: R hip fracture after fall   Does the need for close, 24 hr/day Medical supervision in concert with the patient's rehab needs make it unreasonable for this patient to be served in a less intensive setting? Yes Co-Morbidities requiring supervision/potential complications: Afib,CHF, Seizure Disorder, AML, breast cancer, anemia, DMII, HLD,CAD,COPD Due to bladder management, bowel management, safety, skin/wound care, disease management, medication administration, pain management, and patient education, does the patient require 24 hr/day rehab nursing? Yes Does the patient require coordinated care of a physician, rehab nurse, PT, OT, and SLP to address physical and functional deficits in the context of the above medical diagnosis(es)? Yes Addressing deficits in the following areas: balance, endurance, locomotion, strength, transferring, bowel/bladder control, bathing, dressing, feeding, grooming, toileting, and psychosocial support Can the patient actively participate in an intensive therapy program of at least 3 hrs of therapy 5 days a week? Yes The potential for patient to make measurable gains while on inpatient rehab is excellent Anticipated functional outcomes upon discharge from inpatient rehab: modified independent PT, modified independent OT, n/a SLP Estimated rehab length of stay to reach the above functional goals is:  14 Anticipated discharge destination: Home 10. Overall Rehab/Functional Prognosis: excellent   MD Signature: Jennye Boroughs

## 2022-09-02 NOTE — Progress Notes (Signed)
Occupational Therapy Treatment Patient Details Name: [name] MRN: [id number] DOB: 05/15/1941 Today's Date: 09/02/2022   History of present illness Pt is an 81 y.o. female admitted 08/29/22 after fall at work sustaining R intertrochanteric hip fx; also with persistent afib. S/p R hip IMN 9/23. PMH includes DM, CAD, COPD, cardiomyopathy, CKD 2, aflutter, HTN, arthritis, acute myelogenous leukemia, breast CA, uterine CA, seizure disorder.   OT comments  Pt eager for therapy today, making progress towards OT goals and motivated to demonstrate tolerance for AIR level therapy. More edema in LLE today, stedy used for transfers with mod A +2. Improved sitting balance for ADL participation, Pt remains max A for LB ADL - will benefit from AE education. HR remained in 70's today throughout session, SPO2 with good pleth was >90% on RA. Current POC at AIR level rehab remains essential and appropriate.    Recommendations for follow up therapy are one component of a multi-disciplinary discharge planning process, led by the attending physician.  Recommendations may be updated based on patient status, additional functional criteria and insurance authorization.    Follow Up Recommendations  Acute inpatient rehab (3hours/day)    Assistance Recommended at Discharge Frequent or constant Supervision/Assistance  Patient can return home with the following  Two people to help with walking and/or transfers;A lot of help with bathing/dressing/bathroom;Assistance with cooking/housework;Assist for transportation;Help with stairs or ramp for entrance   Equipment Recommendations  Tub/shower seat;BSC/3in1    Recommendations for Other Services Rehab consult;PT consult    Precautions / Restrictions Precautions Precautions: Fall;Other (comment) Precaution Comments: Watch HR, SpO2 (does not wear baseline) Restrictions Weight Bearing Restrictions: Yes RLE Weight Bearing: Weight bearing as tolerated       Mobility  Bed Mobility Overal bed mobility: Needs Assistance Bed Mobility: Supine to Sit     Supine to sit: Mod assist, HOB elevated     General bed mobility comments: increased time and effort, modA for RLE management, trunk elevation, and scooting hips to EOB    Transfers Overall transfer level: Needs assistance Equipment used: Ambulation equipment used Transfers: Sit to/from Stand Sit to Stand: Mod assist, +2 physical assistance, Min assist     Step pivot transfers: Mod assist, +2 physical assistance     General transfer comment: heavy modA+2 to stand from slightly elevated EOB into stedy frame; 5x sit<>stand from stedy frame with minA+1-2, heavy reliance on BUE support, verbal cues for trunk/hip extension to achieve fully standing; modA+2 for eccentric lowering to sit in recliner Transfer via Lift Equipment: Stedy   Balance Overall balance assessment: Needs assistance Sitting-balance support: No upper extremity supported, Feet supported Sitting balance-Leahy Scale: Fair Sitting balance - Comments: improved sitting balance   Standing balance support: Reliant on assistive device for balance, During functional activity Standing balance-Leahy Scale: Poor Standing balance comment: reliant on BUE support standing in stedy frame with min guard                           ADL either performed or assessed with clinical judgement   ADL Overall ADL's : Needs assistance/impaired Eating/Feeding: Set up;Sitting   Grooming: Wash/dry hands;Wash/dry face;Set up;Sitting Grooming Details (indicate cue type and reason): sitting EOB Upper Body Bathing: Moderate assistance   Lower Body Bathing: Maximal assistance   Upper Body Dressing : Minimal assistance;Sitting Upper Body Dressing Details (indicate cue type and reason): extra gown Lower Body Dressing: Maximal assistance;Sitting/lateral leans Lower Body Dressing Details (indicate cue type and reason):  unable to perform figure 4 - will  need AE training Toilet Transfer: Moderate assistance;+2 for physical assistance;+2 for safety/equipment (stedy) Toilet Transfer Details (indicate cue type and reason): cues for sequencing with weight shifts Toileting- Clothing Manipulation and Hygiene: Maximal assistance;+2 for safety/equipment;Sit to/from stand       Functional mobility during ADLs: Moderate assistance;+2 for physical assistance;+2 for safety/equipment (stedy) General ADL Comments: decreased access to LB for ADL, weezing today    Extremity/Trunk Assessment Upper Extremity Assessment Upper Extremity Assessment: Overall WFL for tasks assessed;Generalized weakness   Lower Extremity Assessment RLE Deficits / Details: s/p R hip IMN with expected post-op pain and weakness; R knee flex/ext 3/5, hip <3/5        Vision   Vision Assessment?: No apparent visual deficits   Perception     Praxis      Cognition Arousal/Alertness: Awake/alert Behavior During Therapy: WFL for tasks assessed/performed Overall Cognitive Status: Within Functional Limits for tasks assessed                                          Exercises Other Exercises Other Exercises: incentive spirometer x10 with good technique - pt pulling ~728m    Shoulder Instructions       General Comments when reliable pleth, SpO2 94-95% on RA, HR 70s; post-mobility BP 148/52. notable wheezing, which pt reports she has at baseline occasionally. noted RLE swelling, BLEs elevated end of session and encouraged ankle pumps/quad sets    Pertinent Vitals/ Pain       Pain Assessment Pain Assessment: Faces Faces Pain Scale: Hurts even more Pain Location: R hip Pain Descriptors / Indicators: Discomfort, Grimacing, Guarding Pain Intervention(s): Monitored during session, Repositioned, Ice applied, Premedicated before session  Home Living                                          Prior Functioning/Environment               Frequency  Min 2X/week        Progress Toward Goals  OT Goals(current goals can now be found in the care plan section)  Progress towards OT goals: Progressing toward goals  Acute Rehab OT Goals Patient Stated Goal: get back to inpatient rehab OT Goal Formulation: With patient Time For Goal Achievement: 09/14/22 Potential to Achieve Goals: Good ADL Goals Pt Will Perform Grooming: with supervision;standing Pt Will Perform Upper Body Dressing: with modified independence;sitting Pt Will Perform Lower Body Dressing: with supervision;sit to/from stand Pt Will Transfer to Toilet: with supervision;ambulating Pt Will Perform Toileting - Clothing Manipulation and hygiene: with supervision;sitting/lateral leans Additional ADL Goal #1: Pt will perform bed mobility at supervision level prior to engaging in ADL  Plan Discharge plan remains appropriate    Co-evaluation    PT/OT/SLP Co-Evaluation/Treatment: Yes Reason for Co-Treatment: For patient/therapist safety;To address functional/ADL transfers PT goals addressed during session: Mobility/safety with mobility;Balance;Proper use of DME;Strengthening/ROM OT goals addressed during session: ADL's and self-care;Proper use of Adaptive equipment and DME;Strengthening/ROM      AM-PAC OT "6 Clicks" Daily Activity     Outcome Measure   Help from another person eating meals?: None Help from another person taking care of personal grooming?: A Little Help from another person toileting, which includes using toliet, bedpan, or urinal?: A  Lot Help from another person bathing (including washing, rinsing, drying)?: A Lot Help from another person to put on and taking off regular upper body clothing?: A Little Help from another person to put on and taking off regular lower body clothing?: A Lot 6 Click Score: 16    End of Session Equipment Utilized During Treatment: Gait belt (stedy)  OT Visit Diagnosis: Unsteadiness on feet (R26.81);Other  abnormalities of gait and mobility (R26.89);History of falling (Z91.81);Muscle weakness (generalized) (M62.81);Pain Pain - Right/Left: Right Pain - part of body: Hip   Activity Tolerance Patient tolerated treatment well   Patient Left in chair;with call bell/phone within reach;with chair alarm set   Nurse Communication Mobility status;Precautions        Time: 1004-1030 OT Time Calculation (min): 26 min  Charges: OT General Charges $OT Visit: 1 Visit OT Treatments $Self Care/Home Management : 8-22 mins  Jesse Sans OTR/L Acute Rehabilitation Services Office: [place] 09/02/2022, 1:46 PM

## 2022-09-02 NOTE — Progress Notes (Signed)
Noted with audible exp.wheezing. MD made aware. Duoneb nebulizer  given, with relief after few min.

## 2022-09-02 NOTE — Progress Notes (Signed)
Physical Therapy Treatment Patient Details Name: [name] MRN: [id number] DOB: 02/21/1941 Today's Date: 09/02/2022   History of Present Illness Pt is an 81 y.o. female admitted 08/29/22 after fall at work sustaining R intertrochanteric hip fx; also with persistent afib. S/p R hip IMN 9/23. PMH includes DM, CAD, COPD, cardiomyopathy, CKD 2, aflutter, HTN, arthritis, acute myelogenous leukemia, breast CA, uterine CA, seizure disorder.   PT Comments    Pt progressing with mobility. Today's session focused on transfer training and standing tolerance with stedy standing frame; pt requiring up to modA+2 for mobility. Pt remains limited by generalized weakness, decreased activity tolerance, impaired balance strategies/postural reactions and R hip pain. Continue to recommend intensive AIR-level therapies to maximize functional mobility and independence prior to return home.  SpO2 94-95% on RA (when pleth reliable) HR 70s Post-mobility BP 148/52    Recommendations for follow up therapy are one component of a multi-disciplinary discharge planning process, led by the attending physician.  Recommendations may be updated based on patient status, additional functional criteria and insurance authorization.  Follow Up Recommendations  Acute inpatient rehab (3hours/day)     Assistance Recommended at Discharge Frequent or constant Supervision/Assistance  Patient can return home with the following A lot of help with walking and/or transfers;A lot of help with bathing/dressing/bathroom;Assistance with cooking/housework;Assist for transportation;Help with stairs or ramp for entrance   Equipment Recommendations   (TBD)    Recommendations for Other Services       Precautions / Restrictions Precautions Precautions: Fall;Other (comment) Precaution Comments: Watch HR, SpO2 (does not wear baseline) Restrictions Weight Bearing Restrictions: Yes RLE Weight Bearing: Weight bearing as tolerated      Mobility  Bed Mobility Overal bed mobility: Needs Assistance Bed Mobility: Supine to Sit     Supine to sit: Mod assist, HOB elevated     General bed mobility comments: increased time and effort, modA for RLE management, trunk elevation, and scooting hips to EOB    Transfers Overall transfer level: Needs assistance Equipment used: Ambulation equipment used Transfers: Sit to/from Stand Sit to Stand: Mod assist, +2 physical assistance, Min assist           General transfer comment: heavy modA+2 to stand from slightly elevated EOB into stedy frame; 5x sit<>stand from stedy frame with minA+1-2, heavy reliance on BUE support, verbal cues for trunk/hip extension to achieve fully standing; modA+2 for eccentric lowering to sit in recliner Transfer via Lift Equipment: Stedy  Ambulation/Gait                   Stairs             Wheelchair Mobility    Modified Rankin (Stroke Patients Only)       Balance Overall balance assessment: Needs assistance Sitting-balance support: No upper extremity supported, Feet supported Sitting balance-Leahy Scale: Fair Sitting balance - Comments: improved sitting balance   Standing balance support: Reliant on assistive device for balance, During functional activity Standing balance-Leahy Scale: Poor Standing balance comment: reliant on BUE support standing in stedy frame with min guard                            Cognition Arousal/Alertness: Awake/alert Behavior During Therapy: WFL for tasks assessed/performed Overall Cognitive Status: Within Functional Limits for tasks assessed  Exercises      General Comments General comments (skin integrity, edema, etc.): when reliable pleth, SpO2 94-95% on RA, HR 70s; post-mobility BP 148/52. notable wheezing, which pt reports she has at baseline occasionally. noted RLE swelling, BLEs elevated end of session and  encouraged ankle pumps/quad sets      Pertinent Vitals/Pain Pain Assessment Pain Assessment: Faces Faces Pain Scale: Hurts even more Pain Location: R hip Pain Descriptors / Indicators: Discomfort, Grimacing, Guarding Pain Intervention(s): Monitored during session, Limited activity within patient's tolerance    Home Living                          Prior Function            PT Goals (current goals can now be found in the care plan section) Progress towards PT goals: Progressing toward goals    Frequency    Min 5X/week      PT Plan Current plan remains appropriate    Co-evaluation              AM-PAC PT "6 Clicks" Mobility   Outcome Measure  Help needed turning from your back to your side while in a flat bed without using bedrails?: A Lot Help needed moving from lying on your back to sitting on the side of a flat bed without using bedrails?: A Lot Help needed moving to and from a bed to a chair (including a wheelchair)?: Total Help needed standing up from a chair using your arms (e.g., wheelchair or bedside chair)?: Total Help needed to walk in hospital room?: Total Help needed climbing 3-5 steps with a railing? : Total 6 Click Score: 8    End of Session Equipment Utilized During Treatment: Gait belt Activity Tolerance: Patient tolerated treatment well Patient left: in chair;with call bell/phone within reach;with chair alarm set Nurse Communication: Mobility status;Need for lift equipment PT Visit Diagnosis: Other abnormalities of gait and mobility (R26.89);Muscle weakness (generalized) (M62.81);Pain Pain - Right/Left: Right Pain - part of body: Hip     Time: 6270-3500 PT Time Calculation (min) (ACUTE ONLY): 28 min  Charges:  $Therapeutic Activity: 8-22 mins                     Mabeline Caras, PT, DPT Acute Rehabilitation Services  Personal: [place] [facility] Office: [place] 09/02/2022, 1:20 PM

## 2022-09-03 DIAGNOSIS — R338 Other retention of urine: Secondary | ICD-10-CM | POA: Diagnosis not present

## 2022-09-03 DIAGNOSIS — E1165 Type 2 diabetes mellitus with hyperglycemia: Secondary | ICD-10-CM | POA: Diagnosis not present

## 2022-09-03 DIAGNOSIS — S72001A Fracture of unspecified part of neck of right femur, initial encounter for closed fracture: Secondary | ICD-10-CM | POA: Diagnosis not present

## 2022-09-03 DIAGNOSIS — I480 Paroxysmal atrial fibrillation: Secondary | ICD-10-CM | POA: Diagnosis not present

## 2022-09-03 LAB — GLUCOSE, CAPILLARY
Glucose-Capillary: 57 mg/dL — ABNORMAL LOW (ref 70–99)
Glucose-Capillary: 120 mg/dL — ABNORMAL HIGH (ref 70–99)
Glucose-Capillary: 118 mg/dL — ABNORMAL HIGH (ref 70–99)
Glucose-Capillary: 69 mg/dL — ABNORMAL LOW (ref 70–99)
Glucose-Capillary: 89 mg/dL (ref 70–99)
Glucose-Capillary: 97 mg/dL (ref 70–99)
Glucose-Capillary: 89 mg/dL (ref 70–99)

## 2022-09-03 LAB — CBC WITH DIFFERENTIAL/PLATELET
Abs Immature Granulocytes: 0.02 10*3/uL (ref 0.00–0.07)
Basophils Absolute: 0 10*3/uL (ref 0.0–0.1)
Basophils Relative: 0 %
Eosinophils Absolute: 0.2 10*3/uL (ref 0.0–0.5)
Eosinophils Relative: 3 %
HCT: 28.1 % — ABNORMAL LOW (ref 36.0–46.0)
Hemoglobin: 9.7 g/dL — ABNORMAL LOW (ref 12.0–15.0)
Immature Granulocytes: 0 %
Lymphocytes Relative: 15 %
Lymphs Abs: 0.8 10*3/uL (ref 0.7–4.0)
MCH: 31.5 pg (ref 26.0–34.0)
MCHC: 34.5 g/dL (ref 30.0–36.0)
MCV: 91.2 fL (ref 80.0–100.0)
Monocytes Absolute: 0.5 10*3/uL (ref 0.1–1.0)
Monocytes Relative: 8 %
Neutro Abs: 4.2 10*3/uL (ref 1.7–7.7)
Neutrophils Relative %: 74 %
Platelets: 111 10*3/uL — ABNORMAL LOW (ref 150–400)
RBC: 3.08 MIL/uL — ABNORMAL LOW (ref 3.87–5.11)
RDW: 13.5 % (ref 11.5–15.5)
WBC: 5.7 10*3/uL (ref 4.0–10.5)
nRBC: 0 % (ref 0.0–0.2)

## 2022-09-03 LAB — BASIC METABOLIC PANEL
Anion gap: 11 (ref 5–15)
BUN: 57 mg/dL — ABNORMAL HIGH (ref 8–23)
CO2: 22 mmol/L (ref 22–32)
Calcium: 7.5 mg/dL — ABNORMAL LOW (ref 8.9–10.3)
Chloride: 110 mmol/L (ref 98–111)
Creatinine, Ser: 2.71 mg/dL — ABNORMAL HIGH (ref 0.44–1.00)
GFR, Estimated: 17 mL/min — ABNORMAL LOW (ref >60)
Glucose, Bld: 90 mg/dL (ref 70–99)
Potassium: 3.6 mmol/L (ref 3.5–5.1)
Sodium: 143 mmol/L (ref 135–145)

## 2022-09-03 LAB — MAGNESIUM: Magnesium: 2.7 mg/dL — ABNORMAL HIGH (ref 1.7–2.4)

## 2022-09-03 MED ORDER — FUROSEMIDE 10 MG/ML IJ SOLN
40.0000 mg | Freq: Once | INTRAMUSCULAR | Status: AC
  Administered 2022-09-03: 40 mg via INTRAVENOUS
  Filled 2022-09-03: qty 4

## 2022-09-03 MED ORDER — INSULIN GLARGINE-YFGN 100 UNIT/ML ~~LOC~~ SOLN
7.0000 [IU] | Freq: Every day | SUBCUTANEOUS | Status: DC
  Administered 2022-09-03: 7 [IU] via SUBCUTANEOUS
  Filled 2022-09-03 (×2): qty 0.07

## 2022-09-03 NOTE — Progress Notes (Signed)
Inpatient Diabetes Program Recommendations  AACE/ADA: New Consensus Statement on Inpatient Glycemic Control (2015)  Target Ranges:  Prepandial:   less than 140 mg/dL      Peak postprandial:   less than 180 mg/dL (1-2 hours)      Critically ill patients:  140 - 180 mg/dL   Lab Results  Component Value Date   GLUCAP 120 (H) 09/03/2022   HGBA1C 9.9 (A) 08/13/2022    Review of Glycemic Control  Latest Reference Range & Units 09/02/22 06:18 09/02/22 11:33 09/02/22 16:03 09/02/22 21:27 09/03/22 06:12 09/03/22 06:50  Glucose-Capillary 70 - 99 mg/dL 215 (H) 99 110 (H) 114 (H) 57 (L) 120 (H)   Diabetes history: DM Outpatient Diabetes medications:  Toujeo 16 units daily, Novolog 10 units tid with meals, CF: Novolog (BG-140/25) Current orders for Inpatient glycemic control:  Novolog 0-15 units tid with meals and HS Semglee 8 units q HS Inpatient Diabetes Program Recommendations:    Consider reducing Semglee to 7 units q HS.  Also consider reducing Novolog correction to sensitive (0-9 units) tid with meals.   Thanks,  Adah Perl, RN, BC-ADM Inpatient Diabetes Coordinator Pager [phone]  (8a-5p)

## 2022-09-03 NOTE — Progress Notes (Signed)
Mobility Specialist Progress Note    09/03/22 1402  Mobility  Activity Transferred to/from [facility]  Level of Assistance Moderate assist, patient does 50-74%  Assistive Device Stedy  RLE Weight Bearing WBAT  Activity Response Tolerated well  $Mobility charge 1 Mobility   Pt requesting to attempt BM. Left with call bell in reach.   Hildred Alamin Mobility Specialist

## 2022-09-03 NOTE — Progress Notes (Signed)
Hypoglycemic Event  CBG: 69  Treatment: 8 oz juice/soda  Symptoms: None  Follow-up CBG: Time:1230 CBG Result:118  Possible Reasons for Event: Inadequate meal intake  Comments/MD notified:DR. Xu notified    Collene Mares I

## 2022-09-03 NOTE — Progress Notes (Signed)
Admit: 08/29/2022 LOS: 5  [name] AoCKD3 in setting of hip fracture and surgical repair 9/23  Subjective:  No interval events Seen in room 1.3L UOP SCr good improvement to 2.7, K 3.6 Still seems poor PO   09/26 0701 - 09/27 0700 In: 2080 [P.O.:745; I.V.:1335] Out: 1325 [Urine:1325]  Filed Weights   08/30/22 0753 08/31/22 0332 09/01/22 0658  Weight: 72.5 kg 80.4 kg 81.1 kg    Scheduled Meds:  acetaminophen  1,000 mg Oral Q8H   apixaban  2.5 mg Oral BID   atorvastatin  40 mg Oral Daily   Chlorhexidine Gluconate Cloth  6 each Topical Q0600   docusate sodium  100 mg Oral BID   feeding supplement (GLUCERNA SHAKE)  237 mL Oral BID BM   insulin aspart  0-15 Units Subcutaneous TID WC   insulin aspart  0-5 Units Subcutaneous QHS   insulin glargine-yfgn  8 Units Subcutaneous QHS   levETIRAcetam  500 mg Oral BID   multivitamin with minerals  1 tablet Oral Daily   sodium chloride flush  10-40 mL Intracatheter Q12H   Vitamin D (Ergocalciferol)  50,000 Units Oral Q7 days   Continuous Infusions:  lactated ringers 75 mL/hr at 09/03/22 0450   methocarbamol (ROBAXIN) IV     PRN Meds:.alum & mag hydroxide-simeth, bisacodyl, HYDROmorphone (DILAUDID) injection, ipratropium-albuterol, LORazepam, menthol-cetylpyridinium **OR** phenol, methocarbamol **OR** methocarbamol (ROBAXIN) IV, metoCLOPramide **OR** metoCLOPramide (REGLAN) injection, ondansetron **OR** ondansetron (ZOFRAN) IV, mouth rinse, oxyCODONE, polyethylene glycol, sodium chloride flush  Current Labs: reviewed    Physical Exam:  Blood pressure (!) 144/92, pulse 84, temperature (!) 97.1 F (36.2 C), temperature source Oral, resp. rate 20, height 5' 2.01" (1.575 m), weight 81.1 kg, SpO2 99 %. GEN: NAD, elderly, conversant ENT: NCAT EYES: EOMI CV: Regular, normal S1 and S2 PULM: Clear bilaterally ABD: Soft, nontender SKIN: No identified rashes or lesions EXT: No peripheral edema GU Foley catheter Nonfocal, CN II through XII  grossly intact  A AKI on CKD3b, nonoliguric, resolviing BL SCr 1.6; no outpt nephrologist Renal US 9/24 no acute/obstructive findings UA 9/25 w/o RBC, WBC, protein Improving with supportive care, IVFs, holding torsemide and valsartan home meds NAGMA probably from #1, improved with IVFs S/p R hip fx and repair 9/23 Anemia, mild Mild TCP AFib on apixaban DM2, SGLT2 held HTN ARB and diuretic held COPD HFpEF Hx/o AML, uterine and breast Ca  P Recovering GFR Cont  LR @ 64m/h for another 24h until adequate PO  Will sign off for now.  Please call with any questions or concerns.  Pt does not need follow up with nephrology.  PCP can follow labs upon discharge and refer as necessary.    RRexene Agent MD  09/03/2022, 10:47 AM  Recent Labs  Lab 09/01/22 0540 09/02/22 0325 09/03/22 0322  NA 141 140 143  K 4.1 3.6 3.6  CL 115* 112* 110  CO2 16* 21* 22  GLUCOSE 120* 230* 90  BUN 56* 61* 57*  CREATININE 3.85* 3.54* 2.71*  CALCIUM 7.5* 7.1* 7.5*    Recent Labs  Lab 08/31/22 0533 09/01/22 0540 09/02/22 0325 09/03/22 0322  WBC 7.5 8.2 5.4 5.7  NEUTROABS 6.0  --  3.7 4.2  HGB 9.2* 8.9* 8.7* 9.7*  HCT 29.9* 27.7* 27.2* 28.1*  MCV 96.5 95.8 94.1 91.2  PLT 89* 97* 92* 111*

## 2022-09-03 NOTE — Progress Notes (Signed)
PROGRESS NOTE    ELZADA PYTEL  OVF:[id number] DOB: 06/11/1941 DOA: 08/29/2022 PCP: Isaac Bliss, Rayford Halsted, MD   Brief Narrative:  81 y.o. female with medical history significant of AML (remote); afib on Eliquis; chronic back pain; COPD; CAD; DM; HTN; HLD; chronic diastolic CHF; uterine/breast cancer; and CAD presented with a fall and was found to have right hip fracture.  CT of the head was negative for acute intracranial abnormity.  Orthopedics was consulted.  She underwent surgical intervention on 08/30/2022.  Subsequently, she was found to have acute kidney injury.  Nephrology was consulted.  Assessment & Plan:   Right hip fracture after mechanical fall,  reports tripped by a rug at work -underwent surgical intervention on 08/30/2022. -Follow further orthopedics recommendations.  Continue pain management.  Eliquis has been resumed. -Fall precautions -PT/OT recommend CIR. CIR consulted  Persistent A-fib -Mild intermittent bradycardia noted.  Eliquis has been resumed postprocedure.  Chronic diastolic CHF -Echo on 8/41/6606 had shown EF of 60 to 65%.  Currently showing no signs of volume overload.  Strict input and output.  Daily weights. -Losartan and Jardiance on hold.  -appear volume overloaded, start iv lasix, monitor result, wean oxygen  History of AML in remission and recent breast cancer -Outpatient follow-up with oncology/Dr. [name].  Letrozole on hold for now.  Seizure disorder -Continue Keppra  Acute kidney injury on chronic kidney disease stage IV/Acute metabolic acidosis -Baseline creatinine around 1.5-1.9.  Creatinine peaked at  3.85, Renal ultrasound negative for hydronephrosis. -acidosis resolved, off bicarb drip.  Appreciate nephrology input Cr improving, appear volume overloaded today, hold ivf, monitor volume status  Acute urinary retention -Possibly from recent surgery and immobility.  3 unsuccessful attempts at in and out cath overnight on 09/01/2022.   Foley catheter was subsequently placed on 9/25  Anemia of chronic disease--possibly from renal failure.  Hemoglobin stable.  Monitor intermittently  Thrombocytopenia -Questionable cause.  Monitor intermittently.  No signs of bleeding currently.  Diabetes mellitus type II with hyperglycemia and hypoglycemia -Has had episodes of hypoglycemia.  decrease long-acting insulin.  Continue CBGs with SSI  Hyperlipidemia -Continue statin  Hypertension -Blood pressure intermittently on the lower side.  DC'd Imdur on 09/01/2022.  Amlodipine and metoprolol on hold as well -bp stable, likely able to resume bp meds gradually from 9/28  CAD -Status post history of stenting -no chest pain. -Continue Imdur.  Resume Plavix postprocedure once cleared by orthopedics  COPD -Stable.  Continue albuterol as needed  Chronic pain -Continue current pain management.  Outpatient follow-up with PCP/pain management  DVT prophylaxis: SCDs Code Status: Full Family Communication: None at bedside Disposition Plan: Remains inpatient appropriate because: Of severity of illness.  Monitor volume status, renal function, Will need CIR placement.  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 Lives with his husband   Objective: Vitals:   09/03/22 0209 09/03/22 0423 09/03/22 0748 09/03/22 1209  BP:  (!) 146/52 (!) 144/92 (!) 149/57  Pulse:  85 84 84  Resp: (!) '23 18 20 18  '$ Temp:  98.3 F (36.8 C) (!) 97.1 F (36.2 C) 97.7 F (36.5 C)  TempSrc:  Oral  Oral Oral  SpO2:  93% 99% 96%  Weight:      Height:         Intake/Output Summary (Last 24 hours) at 09/03/2022 1446 Last data filed at 09/03/2022 0450 Gross per 24 hour  Intake 1580 ml  Output 1325 ml  Net 255 ml   Filed Weights   08/30/22 0753 08/31/22 0332 09/01/22 0658  Weight: 72.5 kg 80.4 kg 81.1 kg    Examination:  General:    Elderly female sitting up in chair, c/o right leg pain, reports legs are swollen ENT/neck: No obvious thyromegaly or JVD elevation noted  respiratory: Decreased breath sounds at bases bilaterally with some crackles and intermittently tachypneic  CVS: S1-S2 heard; rate controlled  abdominal: Soft, nontender, still distended; no organomegaly, normal bowel sounds heard  extremities: Bilateral lower extremity edema present; no cyanosis CNS: Awake; still slow to respond.  Poor historian.  No focal neurologic deficit.  Moving extremities.   Lymph: No obvious lymphadenopathy noted  skin: No obvious ecchymosis/rashes  psych: No signs of agitation.  Affect is mostly flat. Musculoskeletal: No obvious other joint swelling/deformity.  Right hip tenderness present    Data Reviewed: I have personally reviewed following labs and imaging studies  CBC: Recent Labs  Lab 08/30/22 0400 08/31/22 0533 09/01/22 0540 09/02/22 0325 09/03/22 0322  WBC 7.7 7.5 8.2 5.4 5.7  NEUTROABS  --  6.0  --  3.7 4.2  HGB 11.1* 9.2* 8.9* 8.7* 9.7*  HCT 34.2* 29.9* 27.7* 27.2* 28.1*  MCV 94.5 96.5 95.8 94.1 91.2  PLT 114* 89* 97* 92* 235*   Basic Metabolic Panel: Recent Labs  Lab 08/30/22 0400 08/31/22 0533 09/01/22 0540 09/02/22 0325 09/03/22 0322  NA 141 141 141 140 143  K 3.7 3.8 4.1 3.6 3.6  CL 114* 117* 115* 112* 110  CO2 16* 18* 16* 21* 22  GLUCOSE 198* 162* 120* 230* 90  BUN 50* 51* 56* 61* 57*  CREATININE 2.56* 3.24* 3.85* 3.54* 2.71*  CALCIUM 7.7* 7.5* 7.5* 7.1* 7.5*  MG  --  2.1 2.3 2.5* 2.7*   GFR: Estimated Creatinine Clearance: 16.3 mL/min (A) (by C-G formula based on SCr of 2.71 mg/dL (H)). Liver Function  Tests: No results for input(s): "AST", "ALT", "ALKPHOS", "BILITOT", "PROT", "ALBUMIN" in the last 168 hours. No results for input(s): "LIPASE", "AMYLASE" in the last 168 hours. No results for input(s): "AMMONIA" in the last 168 hours. Coagulation Profile: No results for input(s): "INR", "PROTIME" in the last 168 hours. Cardiac Enzymes: No results for input(s): "CKTOTAL", "CKMB", "CKMBINDEX", "TROPONINI" in the last 168 hours. BNP (last 3 results) No results for input(s): "PROBNP" in the last 8760 hours. HbA1C: No results for input(s): "HGBA1C" in the last 72 hours. CBG: Recent Labs  Lab 09/03/22 0612 09/03/22 0650 09/03/22 1138 09/03/22 1213 09/03/22 1229  GLUCAP 57* 120* 69* 89 118*   Lipid Profile: No results for input(s): "CHOL", "HDL", "LDLCALC", "TRIG", "CHOLHDL", "LDLDIRECT" in the last 72 hours. Thyroid Function Tests: No results for input(s): "TSH", "T4TOTAL", "FREET4", "T3FREE", "THYROIDAB" in the last 72 hours. Anemia Panel: No results for input(s): "VITAMINB12", "FOLATE", "FERRITIN", "TIBC", "IRON", "RETICCTPCT" in the last 72 hours. Sepsis Labs: No results for input(s): "PROCALCITON", "LATICACIDVEN" in the last 168 hours.  Recent Results (from the past 240 hour(s))  MRSA Next Gen by PCR, Nasal     Status: None   Collection Time: 08/29/22  9:30 PM   Specimen: Nasal Mucosa; Nasal Swab  Result Value Ref Range Status  MRSA by PCR Next Gen NOT DETECTED NOT DETECTED Final    Comment: (NOTE) The GeneXpert MRSA Assay (FDA approved for NASAL specimens only), is one component of a comprehensive MRSA colonization surveillance program. It is not intended to diagnose MRSA infection nor to guide or monitor treatment for MRSA infections. Test performance is not FDA approved in patients less than 27 years old. Performed at [facility], [place] [address] [place], [place] [address]          Radiology Studies: No results found.      Scheduled Meds:   acetaminophen  1,000 mg Oral Q8H   apixaban  2.5 mg Oral BID   atorvastatin  40 mg Oral Daily   Chlorhexidine Gluconate Cloth  6 each Topical Q0600   docusate sodium  100 mg Oral BID   feeding supplement (GLUCERNA SHAKE)  237 mL Oral BID BM   insulin aspart  0-15 Units Subcutaneous TID WC   insulin aspart  0-5 Units Subcutaneous QHS   insulin glargine-yfgn  7 Units Subcutaneous QHS   levETIRAcetam  500 mg Oral BID   multivitamin with minerals  1 tablet Oral Daily   sodium chloride flush  10-40 mL Intracatheter Q12H   Vitamin D (Ergocalciferol)  50,000 Units Oral Q7 days   Continuous Infusions:  lactated ringers 75 mL/hr at 09/03/22 0450   methocarbamol (ROBAXIN) IV            Florencia Reasons, MD PhD FACP [facility] 09/03/2022, 2:46 PM

## 2022-09-03 NOTE — Progress Notes (Signed)
Inpatient Rehab Admissions Coordinator:    I continue to await insurance auth regarding potential CIR admit. Updated Pt. States she'd be open to SNF if insurance denies. I will continue to follow for potential admit pending insurance auth.   Clemens Catholic, [place], [place] Admissions Coordinator  [phone] ([place]) [phone] (office)

## 2022-09-03 NOTE — Progress Notes (Signed)
Physical Therapy Treatment Patient Details Name: [name] MRN: [id number] DOB: 1941-11-08 Today's Date: 09/03/2022   History of Present Illness Pt is an 81 y.o. female admitted 08/29/22 after fall at work sustaining R intertrochanteric hip fx; also with persistent afib. S/p R hip IMN 9/23. PMH includes DM, CAD, COPD, cardiomyopathy, CKD 2, aflutter, HTN, arthritis, acute myelogenous leukemia, breast CA, uterine CA, seizure disorder.    PT Comments    Pt making gradual progress.  She was OOB using STEDY with mobility specialist and then to/from [facility] prior to PT arrival , so she did have some fatigue but agreeable to PT.  Mod-max x 2 to stand with STEDY, worked on weight shifting/pre gait with min guard, and returned to bed.  Good effort and participation from pt.  Pt does have significant wheezing that increased with activity - difficulty getting O2 reading but when achieved was 100%.     Recommendations for follow up therapy are one component of a multi-disciplinary discharge planning process, led by the attending physician.  Recommendations may be updated based on patient status, additional functional criteria and insurance authorization.  Follow Up Recommendations  Acute inpatient rehab (3hours/day)     Assistance Recommended at Discharge Frequent or constant Supervision/Assistance  Patient can return home with the following Assistance with cooking/housework;Assist for transportation;Help with stairs or ramp for entrance;Two people to help with walking and/or transfers;Two people to help with bathing/dressing/bathroom   Equipment Recommendations  Other (comment) (defer to post acute)    Recommendations for Other Services       Precautions / Restrictions Precautions Precautions: Fall Precaution Comments: Watch HR, SpO2 (does not wear baseline) Restrictions RLE Weight Bearing: Weight bearing as tolerated     Mobility  Bed Mobility Overal bed mobility: Needs Assistance Bed  Mobility: Sit to Supine       Sit to supine: Max assist, +2 for physical assistance, HOB elevated   General bed mobility comments: Assist for trunk and legs - helicopter technique    Transfers Overall transfer level: Needs assistance Equipment used: Ambulation equipment used Transfers: Sit to/from Stand Sit to Stand: Mod assist, Max assist, +2 physical assistance           General transfer comment: From chair: attempted x 1 with gait belt and pt unable, next attempt used pad under buttock and pt able to stand with max x 2.  From STEDY: mod A x 2 to rise.  Pt had recently gotten up to chair around 1300, transfer to/from [facility] ~1415, and was fatigued Transfer via Lift Equipment: Stedy  Ambulation/Gait             Pre-gait activities: Worked on standing tall with buttock tucked in STEDY.  Had pt weight shifting and progressing to lifting heels (rotating sides) x 10.  Pt initially having difficutly lifting R heel but able to progress.  Stood for 2-3 mins     Marine scientist Rankin (Stroke Patients Only)       Balance Overall balance assessment: Needs assistance Sitting-balance support: No upper extremity supported, Feet supported Sitting balance-Leahy Scale: Fair     Standing balance support: Reliant on assistive device for balance, During functional activity Standing balance-Leahy Scale: Poor Standing balance comment: reliant on BUE support standing in stedy frame with min guard; Pt preferring to lean on elbows but with cues able to progress to using hands, shoulders up, buttock tucked  Cognition Arousal/Alertness: Awake/alert Behavior During Therapy: WFL for tasks assessed/performed Overall Cognitive Status: Within Functional Limits for tasks assessed                                          Exercises General Exercises - Lower Extremity Ankle Circles/Pumps: AROM,  Both, 10 reps, Seated Quad Sets: AROM, Both, 10 reps, Supine Long Arc Quad: AROM, Left, AAROM, Right, 10 reps, Seated    General Comments General comments (skin integrity, edema, etc.): Pt with significant wheezing with activity.  Difficulty getting O2 reading but once achieved was 100% on 2 L.      Pertinent Vitals/Pain Pain Assessment Pain Assessment: 0-10 Pain Score:  (0/10 pre, 7/10 with transfers, 5/10 post) Pain Descriptors / Indicators: Discomfort, Grimacing, Guarding Pain Intervention(s): Limited activity within patient's tolerance, Monitored during session    Home Living                          Prior Function            PT Goals (current goals can now be found in the care plan section) Progress towards PT goals: Progressing toward goals    Frequency    Min 5X/week      PT Plan Current plan remains appropriate    Co-evaluation              AM-PAC PT "6 Clicks" Mobility   Outcome Measure  Help needed turning from your back to your side while in a flat bed without using bedrails?: A Lot Help needed moving from lying on your back to sitting on the side of a flat bed without using bedrails?: A Lot Help needed moving to and from a bed to a chair (including a wheelchair)?: Total Help needed standing up from a chair using your arms (e.g., wheelchair or bedside chair)?: Total Help needed to walk in hospital room?: Total Help needed climbing 3-5 steps with a railing? : Total 6 Click Score: 8    End of Session Equipment Utilized During Treatment: Gait belt Activity Tolerance: Patient tolerated treatment well Patient left: with call bell/phone within reach;in bed;with bed alarm set;with SCD's reapplied Nurse Communication: Mobility status;Need for lift equipment PT Visit Diagnosis: Other abnormalities of gait and mobility (R26.89);Muscle weakness (generalized) (M62.81);Pain Pain - Right/Left: Right Pain - part of body: Hip     Time:  8416-6063 PT Time Calculation (min) (ACUTE ONLY): 31 min  Charges:  $Therapeutic Exercise: 8-22 mins $Therapeutic Activity: 8-22 mins                     Abran Richard, PT Acute Rehab [facility] Re[facility] [phone]    Karlton Lemon 09/03/2022, 3:46 PM

## 2022-09-03 NOTE — Progress Notes (Signed)
Mobility Specialist Progress Note    09/03/22 1324  Mobility  Activity Transferred from bed to chair  Level of Assistance +2 (takes two people)  Assistive Device Stedy  RLE Weight Bearing WBAT  Activity Response Tolerated well  $Mobility charge 1 Mobility   Post-Mobility: 99 HR  Pt received and agreeable. Left with call bell in reach.   Hildred Alamin Mobility Specialist

## 2022-09-04 DIAGNOSIS — R338 Other retention of urine: Secondary | ICD-10-CM | POA: Diagnosis not present

## 2022-09-04 DIAGNOSIS — Z794 Long term (current) use of insulin: Secondary | ICD-10-CM

## 2022-09-04 DIAGNOSIS — I480 Paroxysmal atrial fibrillation: Secondary | ICD-10-CM

## 2022-09-04 DIAGNOSIS — E1165 Type 2 diabetes mellitus with hyperglycemia: Secondary | ICD-10-CM | POA: Diagnosis not present

## 2022-09-04 DIAGNOSIS — S72001A Fracture of unspecified part of neck of right femur, initial encounter for closed fracture: Secondary | ICD-10-CM | POA: Diagnosis not present

## 2022-09-04 LAB — GLUCOSE, CAPILLARY
Glucose-Capillary: 69 mg/dL — ABNORMAL LOW (ref 70–99)
Glucose-Capillary: 85 mg/dL (ref 70–99)
Glucose-Capillary: 113 mg/dL — ABNORMAL HIGH (ref 70–99)
Glucose-Capillary: 173 mg/dL — ABNORMAL HIGH (ref 70–99)
Glucose-Capillary: 166 mg/dL — ABNORMAL HIGH (ref 70–99)

## 2022-09-04 LAB — BASIC METABOLIC PANEL
Anion gap: 13 (ref 5–15)
BUN: 52 mg/dL — ABNORMAL HIGH (ref 8–23)
CO2: 20 mmol/L — ABNORMAL LOW (ref 22–32)
Calcium: 7.6 mg/dL — ABNORMAL LOW (ref 8.9–10.3)
Chloride: 113 mmol/L — ABNORMAL HIGH (ref 98–111)
Creatinine, Ser: 2.02 mg/dL — ABNORMAL HIGH (ref 0.44–1.00)
GFR, Estimated: 24 mL/min — ABNORMAL LOW (ref >60)
Glucose, Bld: 165 mg/dL — ABNORMAL HIGH (ref 70–99)
Potassium: 4.2 mmol/L (ref 3.5–5.1)
Sodium: 146 mmol/L — ABNORMAL HIGH (ref 135–145)

## 2022-09-04 MED ORDER — SODIUM BICARBONATE 650 MG PO TABS
650.0000 mg | ORAL_TABLET | Freq: Two times a day (BID) | ORAL | Status: DC
  Administered 2022-09-04 – 2022-09-05 (×3): 650 mg via ORAL
  Filled 2022-09-04 (×3): qty 1

## 2022-09-04 MED ORDER — INSULIN ASPART 100 UNIT/ML IJ SOLN
0.0000 [IU] | Freq: Three times a day (TID) | INTRAMUSCULAR | Status: DC
  Administered 2022-09-04 – 2022-09-05 (×2): 2 [IU] via SUBCUTANEOUS
  Administered 2022-09-05: 1 [IU] via SUBCUTANEOUS

## 2022-09-04 MED ORDER — CLOPIDOGREL BISULFATE 75 MG PO TABS
75.0000 mg | ORAL_TABLET | Freq: Every evening | ORAL | Status: DC
  Administered 2022-09-04: 75 mg via ORAL
  Filled 2022-09-04: qty 1

## 2022-09-04 MED ORDER — INSULIN GLARGINE-YFGN 100 UNIT/ML ~~LOC~~ SOLN
6.0000 [IU] | Freq: Every day | SUBCUTANEOUS | Status: DC
  Administered 2022-09-04: 6 [IU] via SUBCUTANEOUS
  Filled 2022-09-04 (×3): qty 0.06

## 2022-09-04 MED ORDER — TAMSULOSIN HCL 0.4 MG PO CAPS
0.4000 mg | ORAL_CAPSULE | Freq: Every day | ORAL | Status: DC
  Administered 2022-09-04: 0.4 mg via ORAL
  Filled 2022-09-04: qty 1

## 2022-09-04 MED ORDER — SENNOSIDES-DOCUSATE SODIUM 8.6-50 MG PO TABS
1.0000 | ORAL_TABLET | Freq: Two times a day (BID) | ORAL | Status: DC
  Administered 2022-09-04 – 2022-09-05 (×3): 1 via ORAL
  Filled 2022-09-04 (×3): qty 1

## 2022-09-04 MED ORDER — POLYETHYLENE GLYCOL 3350 17 G PO PACK
17.0000 g | PACK | Freq: Every day | ORAL | Status: DC
  Administered 2022-09-04 – 2022-09-05 (×2): 17 g via ORAL
  Filled 2022-09-04 (×2): qty 1

## 2022-09-04 NOTE — Progress Notes (Signed)
Hypoglycemic Event  CBG: 69  Treatment: 4 oz juice/soda  Symptoms: None  Follow-up CBG: Time:645  CBG Result:85   Possible Reasons for Event: Medication regimen:    Comments/MD notified:N/A    Jarrett Soho C Tom-Johnson

## 2022-09-04 NOTE — Progress Notes (Signed)
IP rehab admissions - I have received a denial from [facility] for acute inpatient rehab admission.  Recommend pursuit of SNF placement at this point.  I will let patient and family know of denial when I round this morning.  Call for questions.  [phone]

## 2022-09-04 NOTE — Progress Notes (Signed)
IP rehab admissions - I received information that patient has a workers comp claim due to her accident.  I will open the case with workers comp and I will request authorization for acute inpatient rehab admission.  Will update all once we have a decision from workers comp.  Call me for questions.  [phone]

## 2022-09-04 NOTE — Progress Notes (Signed)
Physical Therapy Treatment Patient Details Name: [name] MRN: [id number] DOB: 1941-03-04 Today's Date: 09/04/2022   History of Present Illness Pt is an 81 y.o. female admitted 08/29/22 after fall at work sustaining R intertrochanteric hip fx; also with persistent afib. S/p R hip IMN 9/23. PMH includes DM, CAD, COPD, cardiomyopathy, CKD 2, aflutter, HTN, arthritis, acute myelogenous leukemia, breast CA, uterine CA, seizure disorder.    PT Comments    Pt received supine and agreeable to session with continued progress towards acute goals. Pt able to complete bed mobility with mod assist to bring RLE to and off EOB, with pt also needing assist to elevate trunk. Pt with increased standing tolerance this session and demonstrating ability to come to standing with up to mod assist of 1 in stedy frame. Continued to work on weight shifting/,arching/pre gait with pt able to clear R foot from stedy platform this session  but unable to accept adequate weight on RLE to lift L foot. Plan to trial gait with upright EVA walker next session. Pt continues to benefit from skilled PT services to progress toward functional mobility goals.    Recommendations for follow up therapy are one component of a multi-disciplinary discharge planning process, led by the attending physician.  Recommendations may be updated based on patient status, additional functional criteria and insurance authorization.  Follow Up Recommendations  Acute inpatient rehab (3hours/day)     Assistance Recommended at Discharge Frequent or constant Supervision/Assistance  Patient can return home with the following Assistance with cooking/housework;Assist for transportation;Help with stairs or ramp for entrance;Two people to help with walking and/or transfers;Two people to help with bathing/dressing/bathroom   Equipment Recommendations  Other (comment) (defer to post acute)    Recommendations for Other Services       Precautions /  Restrictions Precautions Precautions: Fall Precaution Comments: Watch HR, SpO2 (does not wear baseline) Restrictions Weight Bearing Restrictions: Yes RLE Weight Bearing: Weight bearing as tolerated     Mobility  Bed Mobility Overal bed mobility: Needs Assistance Bed Mobility: Supine to Sit     Supine to sit: Mod assist, HOB elevated     General bed mobility comments: Assist for trunk and legs with cues for sequencing    Transfers Overall transfer level: Needs assistance Equipment used: Ambulation equipment used Transfers: Sit to/from Stand Sit to Stand: Mod assist, Min assist           General transfer comment: From chair: mod assist to come to standing for EOB, cues for upright posture and to tuck hips, min assist from stready seat Transfer via Lift Equipment: Stedy  Ambulation/Gait             Pre-gait activities: continued working on standing tall with buttock tucked in stedy frame. weight shifting and progressing to lifting RLE with pt able to clear foot x10, attempting to lift L foot with pt able when leaning upper body over steady rail but unable when pushing through hands on stedy rail.     Stairs             Wheelchair Mobility    Modified Rankin (Stroke Patients Only)       Balance Overall balance assessment: Needs assistance Sitting-balance support: No upper extremity supported, Feet supported Sitting balance-Leahy Scale: Fair Sitting balance - Comments: improved sitting balance   Standing balance support: Reliant on assistive device for balance, During functional activity Standing balance-Leahy Scale: Poor Standing balance comment: reliant on BUE support standing in stedy frame with min  guard; Pt preferring to lean on elbows but with cues able to progress to using hands, shoulders up, buttock tucked                            Cognition Arousal/Alertness: Awake/alert Behavior During Therapy: WFL for tasks  assessed/performed Overall Cognitive Status: Within Functional Limits for tasks assessed                                          Exercises General Exercises - Lower Extremity Ankle Circles/Pumps: AROM, Both, 10 reps, Seated Long Arc Quad: AROM, Left, AAROM, Right, 10 reps, Seated Hip Flexion/Marching: AROM, AAROM, Right, Left, 10 reps, Seated    General Comments General comments (skin integrity, edema, etc.): VSS on supplemental O2      Pertinent Vitals/Pain Pain Assessment Pain Assessment: Faces Faces Pain Scale: Hurts little more Pain Location: R hip Pain Descriptors / Indicators: Discomfort, Grimacing, Guarding Pain Intervention(s): Monitored during session, Limited activity within patient's tolerance, Repositioned    Home Living                          Prior Function            PT Goals (current goals can now be found in the care plan section) Acute Rehab PT Goals Patient Stated Goal: post-acute rehab at [facility] PT Goal Formulation: With patient Time For Goal Achievement: 09/14/22    Frequency    Min 5X/week      PT Plan Current plan remains appropriate    Co-evaluation              AM-PAC PT "6 Clicks" Mobility   Outcome Measure  Help needed turning from your back to your side while in a flat bed without using bedrails?: A Lot Help needed moving from lying on your back to sitting on the side of a flat bed without using bedrails?: A Lot Help needed moving to and from a bed to a chair (including a wheelchair)?: Total Help needed standing up from a chair using your arms (e.g., wheelchair or bedside chair)?: Total Help needed to walk in hospital room?: Total Help needed climbing 3-5 steps with a railing? : Total 6 Click Score: 8    End of Session   Activity Tolerance: Patient tolerated treatment well Patient left: with call bell/phone within reach;in chair Nurse Communication: Mobility status PT Visit Diagnosis: Other  abnormalities of gait and mobility (R26.89);Muscle weakness (generalized) (M62.81);Pain Pain - Right/Left: Right Pain - part of body: Hip     Time: 8101-7510 PT Time Calculation (min) (ACUTE ONLY): 34 min  Charges:  $Therapeutic Exercise: 8-22 mins $Therapeutic Activity: 8-22 mins                     Shada Nienaber R. PTA Acute Rehabilitation Services Office: [place] 09/04/2022, 11:43 AM

## 2022-09-04 NOTE — Progress Notes (Signed)
Inpatient Diabetes Program Recommendations  AACE/ADA: New Consensus Statement on Inpatient Glycemic Control (2015)  Target Ranges:  Prepandial:   less than 140 mg/dL      Peak postprandial:   less than 180 mg/dL (1-2 hours)      Critically ill patients:  140 - 180 mg/dL   Lab Results  Component Value Date   GLUCAP 113 (H) 09/04/2022   HGBA1C 9.9 (A) 08/13/2022    Review of Glycemic Control  Latest Reference Range & Units 09/04/22 06:16 09/04/22 06:35 09/04/22 11:12  Glucose-Capillary 70 - 99 mg/dL 69 (L) 85 113 (H)   Diabetes history: DM  Outpatient Diabetes medications:  Toujeo 16 units daily, Novolog 10 units tid with meals, CF: Novolog (BG-140/25) Current orders for Inpatient glycemic control:  Novolog 0-15 units tid with meals and HS Semglee 7 units q HS Inpatient Diabetes Program Recommendations:   Consider reducing Novolog correction to very sensitive (0-6 units) tid with meals and reduce Semglee to 6 units q HS.   Thanks,  Adah Perl, RN, BC-ADM Inpatient Diabetes Coordinator Pager [phone]  (8a-5p)

## 2022-09-04 NOTE — Progress Notes (Signed)
Mobility Specialist Progress Note    09/04/22 1340  Mobility  Activity Transferred from chair to bed  Level of Assistance Minimal assist, patient does 75% or more  Assistive Device Stedy  RLE Weight Bearing WBAT  Activity Response Tolerated well  $Mobility charge 1 Mobility   Pt received c/o pain. Requesting to sit EOB. Left with bed alarm on and call bell in reach.   Hildred Alamin Mobility Specialist

## 2022-09-04 NOTE — Consult Note (Addendum)
   [facility] Inpatient Consult   09/04/2022  HANNAH STRADER 12-26-1940 [id number]   [place] [facility] [ACO] Patient: [place] [employer]   Primary Care Provider: Isaac Bliss, Rayford Halsted, MD, [place] [facility]  is with a Care Management team and program and is listed for the [facility] follow up needs for care coordination   1:51 pm Patient is for [facility] inpatient rehab, met with the patient at the bedside, patient seated up in chair. Patient confirms plan for inpatient rehab.  Patient endorses  [facility] as her PCP office. Left a card at bedside with other paperwork on bedside table.   09/04/22  3:16 pm Patient screened for hospitalization with noted extreme high risk score for unplanned readmission risk.  Reviewed to assess for potential [place] [facility] service needs for post hospital transition for readmission prevention needs.  Review of patient's medical record reveals patient is being considered for an inpatient rehabilitation transition with insurance approval as reviewed of CIR consult.  Patient was previously active in a care coordination program noted.   Plan:  Continue to follow progress and disposition to assess for post hospital care management needs for transition.   For questions contact:    [name], RN BSN [place]  [phone] business mobile phone Toll free office [phone]  *[place]  [phone] Fax number: [phone] [email or web address]

## 2022-09-04 NOTE — Progress Notes (Signed)
Nutrition Follow-up  DOCUMENTATION CODES:   Not applicable  INTERVENTION:  -Continue carb modified diet as tolerated. -Provide Glucerna BID (vanilla only) -Provide aggressive bowel regimen (no BM x 6 days)  NUTRITION DIAGNOSIS:  Increased nutrient needs related to post-op healing as evidenced by estimated needs.  GOAL:  Patient will meet greater than or equal to 90% of their needs progressing  MONITOR:  PO intake, Weight trends, Skin, Labs, Supplement acceptance  REASON FOR ASSESSMENT:  Follow Up Hip fracture protocol  ASSESSMENT:  Pt with hx of HTN, HLD, CAD, CKD, COPD, DM type 2, hx NSTEMI, and hx of uterine cancer and leukemia, presented to ED after a fall at work earlier in the day. Imaging showed a right hip fx.  Documented po intake of 10-50% of meals. Pt likely not meeting estimated nutrient needs. Visited pt at bedside, pt reports poor appetite due to reflux, pain and constipation. Last documented BM 9/22. Pt started on senna, miralax and colace. Thinks she may eat better when pain is better controlled and she has BM. Ordered Glucerna BID, but has been refusing due to flavor. Pt agreeable to vanilla only. Discussed the importance of adequate nutrition for healing.   Labs reviewed: Na:146, K:4.2, BG:69-165, BUN:52, Cr:2.02, GFR:24  Medications reviewed and include: lipitor, plavix, colace, novolog, semglee, mvi, miralax, ergocalciferol  Diet Order:   Diet Order             Diet Carb Modified Fluid consistency: Thin; Room service appropriate? Yes  Diet effective now                   EDUCATION NEEDS:  No education needs have been identified at this time  Skin:  Skin Assessment: Reviewed RN Assessment  Last BM:  pror to admission  Height:  Ht Readings from Last 1 Encounters:  08/30/22 5' 2.01" (1.575 m)   Weight:  Wt Readings from Last 1 Encounters:  09/01/22 81.1 kg   Ideal Body Weight:  50 kg  BMI:  Body mass index is 32.69 kg/m.  Estimated  Nutritional Needs:   Kcal:  1600-1800 kcal/d  Protein:  80-100g/d  Fluid:  >/=1.6L/d   Candise Bowens, MS, RD, LDN, CNSC See AMiON for contact information

## 2022-09-04 NOTE — Progress Notes (Addendum)
PROGRESS NOTE    [name]  NGE:[id number] DOB: January 28, 1941 DOA: 08/29/2022 PCP: Isaac Bliss, Rayford Halsted, MD   Brief Narrative:  81 y.o. female with medical history significant of AML (remote); afib on Eliquis; chronic back pain; COPD; CAD; DM; HTN; HLD; chronic diastolic CHF; uterine/breast cancer; and CAD presented with a fall and was found to have right hip fracture.  CT of the head was negative for acute intracranial abnormity.  Orthopedics was consulted.  She underwent surgical intervention on 08/30/2022.  Subsequently, she was found to have acute kidney injury.  Nephrology was consulted.  Assessment & Plan:   Right hip fracture after mechanical fall,  reports tripped by a rug at work -underwent surgical intervention on 08/30/2022. -Follow further orthopedics recommendations.  Continue pain management.  Eliquis has been resumed. -Fall precautions -PT/OT recommend CIR. CIR consulted  Persistent A-fib -Mild intermittent bradycardia noted.  Eliquis has been resumed postprocedure.  Chronic diastolic CHF -Echo on 03/09/271 had shown EF of 60 to 65%.  Currently showing no signs of volume overload.  Strict input and output.  Daily weights. -Losartan and Jardiance on hold.  -appear volume overloaded, start iv lasix, monitor result, wean oxygen  History of AML in remission and recent breast cancer -Outpatient follow-up with oncology/Dr. [name].  Letrozole on hold for now.  Seizure disorder -Continue Keppra  Acute kidney injury on chronic kidney disease stage IV/Acute metabolic acidosis -Baseline creatinine around 1.5-1.9.  Creatinine peaked at  3.85, Renal ultrasound negative for hydronephrosis. -acidosis resolved, off bicarb drip.  Appreciate nephrology input Cr improving, appear volume overloaded today, hold ivf, monitor volume status  Acute urinary retention -Possibly from recent surgery and immobility.  3 unsuccessful attempts at in and out cath overnight on 09/01/2022.   Foley catheter was subsequently placed on 9/25  Anemia of chronic disease--possibly from renal failure.  Hemoglobin stable.  Monitor intermittently  Thrombocytopenia -Questionable cause.  Monitor intermittently.  No signs of bleeding currently.  Diabetes mellitus type II with hyperglycemia and hypoglycemia -Has had episodes of hypoglycemia.  Continue to decrease long-acting insulin.  Continue CBGs with SSI sensitive scale  Hyperlipidemia -Continue statin  Hypertension -Blood pressure intermittently on the lower side.  DC'd Imdur on 09/01/2022.  Amlodipine and metoprolol on hold as well -bp stable, likely able to resume bp meds gradually from 9/28  CAD -Status post history of stenting -no chest pain. -Continue Imdur.  Resume Plavix   COPD -Stable.  Continue albuterol as needed  Chronic pain -Continue current pain management.  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  Monitor volume status, renal function, Will need CIR placement.  Consultants: Orthopedics/nephrology  Procedures: Intramedullary nailing of the right hip on 08/30/2022 Antimicrobials: Perioperative   Subjective:   She received one dose of  iv lasix yesterday with 2.6 liter urine output, less edematous, she is on 2liter oxygen,  02 sats > 98%, am lab pending, will wean oxygen, depend on am lab , may or may not need another dose of lasix reports being constipated, start stool softener  Poor Oral intake, does not want eat   Objective: Vitals:   09/03/22 1933 09/03/22 2335 09/04/22 0354 09/04/22 0734  BP: (!) 119/58 (!) 151/49 (!) 161/58 (!) 144/59  Pulse: 87 86 89  89  Resp: '18 17 20 17  '$ Temp: 98.1 F (36.7 C) 97.9 F  (36.6 C) 98.1 F (36.7 C) 97.8 F (36.6 C)  TempSrc: Oral Oral Oral   SpO2: 99% 98% 98% 99%  Weight:      Height:        Intake/Output Summary (Last 24 hours) at 09/04/2022 1050 Last data filed at 09/04/2022 3546 Gross per 24 hour  Intake 650 ml  Output 2650 ml  Net -2000 ml   Filed Weights   08/30/22 0753 08/31/22 0332 09/01/22 0658  Weight: 72.5 kg 80.4 kg 81.1 kg    Examination:  General:    Elderly female sitting up in chair, c/o right leg pain, reports legs are swollen ENT/neck: No obvious thyromegaly or JVD elevation noted  respiratory: Decreased breath sounds at bases bilaterally with some crackles and intermittently tachypneic  CVS: S1-S2 heard; rate controlled  abdominal: Soft, nontender, still distended; no organomegaly, normal bowel sounds heard  extremities: Bilateral lower extremity edema present; no cyanosis CNS: Awake; still slow to respond.  Poor historian.  No focal neurologic deficit.  Moving extremities.   Lymph: No obvious lymphadenopathy noted  skin: No obvious ecchymosis/rashes  psych: No signs of agitation.  Affect is mostly flat. Musculoskeletal: No obvious other joint swelling/deformity.  Right hip tenderness present    Data Reviewed: I have personally reviewed following labs and imaging studies  CBC: Recent Labs  Lab 08/30/22 0400 08/31/22 0533 09/01/22 0540 09/02/22 0325 09/03/22 0322  WBC 7.7 7.5 8.2 5.4 5.7  NEUTROABS  --  6.0  --  3.7 4.2  HGB 11.1* 9.2* 8.9* 8.7* 9.7*  HCT 34.2* 29.9* 27.7* 27.2* 28.1*  MCV 94.5 96.5 95.8 94.1 91.2  PLT 114* 89* 97* 92* 568*   Basic Metabolic Panel: Recent Labs  Lab 08/30/22 0400 08/31/22 0533 09/01/22 0540 09/02/22 0325 09/03/22 0322  NA 141 141 141 140 143  K 3.7 3.8 4.1 3.6 3.6  CL 114* 117* 115* 112* 110  CO2 16* 18* 16* 21* 22  GLUCOSE 198* 162* 120* 230* 90  BUN 50* 51* 56* 61* 57*  CREATININE 2.56* 3.24* 3.85* 3.54* 2.71*  CALCIUM 7.7* 7.5* 7.5* 7.1* 7.5*  MG  --  2.1 2.3 2.5*  2.7*   GFR: Estimated Creatinine Clearance: 16.3 mL/min (A) (by C-G formula based on SCr of 2.71 mg/dL (H)). Liver Function Tests: No results for input(s): "AST", "ALT", "ALKPHOS", "BILITOT", "PROT", "ALBUMIN" in the last 168 hours. No results for input(s): "LIPASE", "AMYLASE" in the last 168 hours. No results for input(s): "AMMONIA" in the last 168 hours. Coagulation Profile: No results for input(s): "INR", "PROTIME" in the last 168 hours. Cardiac Enzymes: No results for input(s): "CKTOTAL", "CKMB", "CKMBINDEX", "TROPONINI" in the last 168 hours. BNP (last 3 results) No results for input(s): "PROBNP" in the last 8760 hours. HbA1C: No results for input(s): "HGBA1C" in the last 72 hours. CBG: Recent Labs  Lab 09/03/22 1229 09/03/22 1626 09/03/22 2138 09/04/22 0616 09/04/22 0635  GLUCAP 118* 97 89 69* 85   Lipid Profile: No results for input(s): "CHOL", "HDL", "LDLCALC", "TRIG", "CHOLHDL", "LDLDIRECT" in the last 72 hours. Thyroid Function Tests: No results for input(s): "TSH", "T4TOTAL", "FREET4", "T3FREE", "THYROIDAB" in the last 72 hours. Anemia Panel: No results for input(s): "VITAMINB12", "FOLATE", "FERRITIN", "TIBC", "IRON", "RETICCTPCT" in the last 72 hours. Sepsis Labs: No results for input(s): "PROCALCITON", "LATICACIDVEN" in the last 168 hours.  Recent Results (from the past 240 hour(s))  MRSA Next Gen by PCR,  Nasal     Status: None   Collection Time: 08/29/22  9:30 PM   Specimen: Nasal Mucosa; Nasal Swab  Result Value Ref Range Status   MRSA by PCR Next Gen NOT DETECTED NOT DETECTED Final    Comment: (NOTE) The GeneXpert MRSA Assay (FDA approved for NASAL specimens only), is one component of a comprehensive MRSA colonization surveillance program. It is not intended to diagnose MRSA infection nor to guide or monitor treatment for MRSA infections. Test performance is not FDA approved in patients less than 90 years old. Performed at [facility], [place] [address] [place], [place] [address]          Radiology Studies: No results found.      Scheduled Meds:  acetaminophen  1,000 mg Oral Q8H   apixaban  2.5 mg Oral BID   atorvastatin  40 mg Oral Daily   Chlorhexidine Gluconate Cloth  6 each Topical Q0600   docusate sodium  100 mg Oral BID   feeding supplement (GLUCERNA SHAKE)  237 mL Oral BID BM   insulin aspart  0-15 Units Subcutaneous TID WC   insulin aspart  0-5 Units Subcutaneous QHS   insulin glargine-yfgn  7 Units Subcutaneous QHS   levETIRAcetam  500 mg Oral BID   multivitamin with minerals  1 tablet Oral Daily   polyethylene glycol  17 g Oral Daily   senna-docusate  1 tablet Oral BID   sodium chloride flush  10-40 mL Intracatheter Q12H   Vitamin D (Ergocalciferol)  50,000 Units Oral Q7 days   Continuous Infusions:  methocarbamol (ROBAXIN) IV            Florencia Reasons, MD PhD FACP [facility] 09/04/2022, 10:50 AM

## 2022-09-05 ENCOUNTER — Other Ambulatory Visit: Payer: Self-pay

## 2022-09-05 ENCOUNTER — Encounter (HOSPITAL_COMMUNITY): Payer: Self-pay | Admitting: Physical Medicine and Rehabilitation

## 2022-09-05 ENCOUNTER — Inpatient Hospital Stay (HOSPITAL_COMMUNITY)
Admission: RE | Admit: 2022-09-05 | Discharge: 2022-10-01 | DRG: 560 | Disposition: A | Discharge status: Home Health | Payer: Worker's Compensation | Source: Intra-hospital | Attending: Physical Medicine and Rehabilitation | Admitting: Physical Medicine and Rehabilitation

## 2022-09-05 DIAGNOSIS — M7989 Other specified soft tissue disorders: Secondary | ICD-10-CM | POA: Diagnosis not present

## 2022-09-05 DIAGNOSIS — I10 Essential (primary) hypertension: Secondary | ICD-10-CM | POA: Diagnosis not present

## 2022-09-05 DIAGNOSIS — Z8049 Family history of malignant neoplasm of other genital organs: Secondary | ICD-10-CM

## 2022-09-05 DIAGNOSIS — M25551 Pain in right hip: Secondary | ICD-10-CM | POA: Diagnosis not present

## 2022-09-05 DIAGNOSIS — R338 Other retention of urine: Secondary | ICD-10-CM | POA: Diagnosis not present

## 2022-09-05 DIAGNOSIS — S72144A Nondisplaced intertrochanteric fracture of right femur, initial encounter for closed fracture: Secondary | ICD-10-CM | POA: Diagnosis not present

## 2022-09-05 DIAGNOSIS — K649 Unspecified hemorrhoids: Secondary | ICD-10-CM | POA: Diagnosis not present

## 2022-09-05 DIAGNOSIS — Z825 Family history of asthma and other chronic lower respiratory diseases: Secondary | ICD-10-CM

## 2022-09-05 DIAGNOSIS — W1830XD Fall on same level, unspecified, subsequent encounter: Secondary | ICD-10-CM | POA: Diagnosis not present

## 2022-09-05 DIAGNOSIS — I130 Hypertensive heart and chronic kidney disease with heart failure and stage 1 through stage 4 chronic kidney disease, or unspecified chronic kidney disease: Secondary | ICD-10-CM | POA: Diagnosis present

## 2022-09-05 DIAGNOSIS — S72141A Displaced intertrochanteric fracture of right femur, initial encounter for closed fracture: Secondary | ICD-10-CM | POA: Diagnosis present

## 2022-09-05 DIAGNOSIS — R7989 Other specified abnormal findings of blood chemistry: Secondary | ICD-10-CM

## 2022-09-05 DIAGNOSIS — D62 Acute posthemorrhagic anemia: Secondary | ICD-10-CM | POA: Diagnosis present

## 2022-09-05 DIAGNOSIS — C9290 Myeloid leukemia, unspecified, not having achieved remission: Secondary | ICD-10-CM | POA: Diagnosis present

## 2022-09-05 DIAGNOSIS — M479 Spondylosis, unspecified: Secondary | ICD-10-CM | POA: Diagnosis present

## 2022-09-05 DIAGNOSIS — K869 Disease of pancreas, unspecified: Secondary | ICD-10-CM | POA: Diagnosis present

## 2022-09-05 DIAGNOSIS — Z800 Family history of malignant neoplasm of digestive organs: Secondary | ICD-10-CM

## 2022-09-05 DIAGNOSIS — E669 Obesity, unspecified: Secondary | ICD-10-CM | POA: Diagnosis present

## 2022-09-05 DIAGNOSIS — S72144S Nondisplaced intertrochanteric fracture of right femur, sequela: Secondary | ICD-10-CM

## 2022-09-05 DIAGNOSIS — Z806 Family history of leukemia: Secondary | ICD-10-CM

## 2022-09-05 DIAGNOSIS — K5900 Constipation, unspecified: Secondary | ICD-10-CM | POA: Diagnosis present

## 2022-09-05 DIAGNOSIS — I4819 Other persistent atrial fibrillation: Secondary | ICD-10-CM | POA: Diagnosis present

## 2022-09-05 DIAGNOSIS — M62838 Other muscle spasm: Secondary | ICD-10-CM | POA: Diagnosis not present

## 2022-09-05 DIAGNOSIS — Z955 Presence of coronary angioplasty implant and graft: Secondary | ICD-10-CM

## 2022-09-05 DIAGNOSIS — I5032 Chronic diastolic (congestive) heart failure: Secondary | ICD-10-CM

## 2022-09-05 DIAGNOSIS — I252 Old myocardial infarction: Secondary | ICD-10-CM

## 2022-09-05 DIAGNOSIS — S72141D Displaced intertrochanteric fracture of right femur, subsequent encounter for closed fracture with routine healing: Principal | ICD-10-CM

## 2022-09-05 DIAGNOSIS — N179 Acute kidney failure, unspecified: Secondary | ICD-10-CM | POA: Diagnosis not present

## 2022-09-05 DIAGNOSIS — N1832 Chronic kidney disease, stage 3b: Secondary | ICD-10-CM | POA: Diagnosis present

## 2022-09-05 DIAGNOSIS — Z803 Family history of malignant neoplasm of breast: Secondary | ICD-10-CM

## 2022-09-05 DIAGNOSIS — I2510 Atherosclerotic heart disease of native coronary artery without angina pectoris: Secondary | ICD-10-CM

## 2022-09-05 DIAGNOSIS — E559 Vitamin D deficiency, unspecified: Secondary | ICD-10-CM | POA: Diagnosis present

## 2022-09-05 DIAGNOSIS — I5042 Chronic combined systolic (congestive) and diastolic (congestive) heart failure: Secondary | ICD-10-CM

## 2022-09-05 DIAGNOSIS — Z87891 Personal history of nicotine dependence: Secondary | ICD-10-CM

## 2022-09-05 DIAGNOSIS — B961 Klebsiella pneumoniae [K. pneumoniae] as the cause of diseases classified elsewhere: Secondary | ICD-10-CM | POA: Diagnosis not present

## 2022-09-05 DIAGNOSIS — Z7901 Long term (current) use of anticoagulants: Secondary | ICD-10-CM

## 2022-09-05 DIAGNOSIS — R609 Edema, unspecified: Secondary | ICD-10-CM | POA: Diagnosis not present

## 2022-09-05 DIAGNOSIS — I429 Cardiomyopathy, unspecified: Secondary | ICD-10-CM | POA: Diagnosis present

## 2022-09-05 DIAGNOSIS — Z823 Family history of stroke: Secondary | ICD-10-CM

## 2022-09-05 DIAGNOSIS — Z853 Personal history of malignant neoplasm of breast: Secondary | ICD-10-CM

## 2022-09-05 DIAGNOSIS — E1122 Type 2 diabetes mellitus with diabetic chronic kidney disease: Secondary | ICD-10-CM | POA: Diagnosis present

## 2022-09-05 DIAGNOSIS — J449 Chronic obstructive pulmonary disease, unspecified: Secondary | ICD-10-CM | POA: Diagnosis present

## 2022-09-05 DIAGNOSIS — S72114A Nondisplaced fracture of greater trochanter of right femur, initial encounter for closed fracture: Secondary | ICD-10-CM | POA: Diagnosis not present

## 2022-09-05 DIAGNOSIS — Z8249 Family history of ischemic heart disease and other diseases of the circulatory system: Secondary | ICD-10-CM

## 2022-09-05 DIAGNOSIS — N183 Chronic kidney disease, stage 3 unspecified: Secondary | ICD-10-CM | POA: Diagnosis not present

## 2022-09-05 DIAGNOSIS — Z95828 Presence of other vascular implants and grafts: Secondary | ICD-10-CM

## 2022-09-05 DIAGNOSIS — Z808 Family history of malignant neoplasm of other organs or systems: Secondary | ICD-10-CM

## 2022-09-05 DIAGNOSIS — Z794 Long term (current) use of insulin: Secondary | ICD-10-CM

## 2022-09-05 DIAGNOSIS — Z87828 Personal history of other (healed) physical injury and trauma: Secondary | ICD-10-CM | POA: Diagnosis not present

## 2022-09-05 DIAGNOSIS — Z8542 Personal history of malignant neoplasm of other parts of uterus: Secondary | ICD-10-CM

## 2022-09-05 DIAGNOSIS — G8929 Other chronic pain: Secondary | ICD-10-CM | POA: Diagnosis present

## 2022-09-05 DIAGNOSIS — N390 Urinary tract infection, site not specified: Secondary | ICD-10-CM | POA: Diagnosis not present

## 2022-09-05 DIAGNOSIS — C9291 Myeloid leukemia, unspecified in remission: Secondary | ICD-10-CM | POA: Diagnosis present

## 2022-09-05 DIAGNOSIS — R569 Unspecified convulsions: Secondary | ICD-10-CM

## 2022-09-05 DIAGNOSIS — Z7951 Long term (current) use of inhaled steroids: Secondary | ICD-10-CM

## 2022-09-05 DIAGNOSIS — Z7984 Long term (current) use of oral hypoglycemic drugs: Secondary | ICD-10-CM

## 2022-09-05 DIAGNOSIS — Z6831 Body mass index (BMI) 31.0-31.9, adult: Secondary | ICD-10-CM

## 2022-09-05 DIAGNOSIS — Z79811 Long term (current) use of aromatase inhibitors: Secondary | ICD-10-CM

## 2022-09-05 DIAGNOSIS — E785 Hyperlipidemia, unspecified: Secondary | ICD-10-CM

## 2022-09-05 DIAGNOSIS — S72001A Fracture of unspecified part of neck of right femur, initial encounter for closed fracture: Secondary | ICD-10-CM | POA: Diagnosis not present

## 2022-09-05 DIAGNOSIS — Z79899 Other long term (current) drug therapy: Secondary | ICD-10-CM

## 2022-09-05 DIAGNOSIS — Z801 Family history of malignant neoplasm of trachea, bronchus and lung: Secondary | ICD-10-CM

## 2022-09-05 DIAGNOSIS — G40909 Epilepsy, unspecified, not intractable, without status epilepticus: Secondary | ICD-10-CM | POA: Diagnosis present

## 2022-09-05 DIAGNOSIS — R339 Retention of urine, unspecified: Secondary | ICD-10-CM | POA: Diagnosis present

## 2022-09-05 DIAGNOSIS — M545 Low back pain, unspecified: Secondary | ICD-10-CM | POA: Diagnosis present

## 2022-09-05 DIAGNOSIS — N184 Chronic kidney disease, stage 4 (severe): Secondary | ICD-10-CM | POA: Diagnosis not present

## 2022-09-05 LAB — CBC WITH DIFFERENTIAL/PLATELET
Abs Immature Granulocytes: 0.01 10*3/uL (ref 0.00–0.07)
Basophils Absolute: 0 10*3/uL (ref 0.0–0.1)
Basophils Relative: 0 %
Eosinophils Absolute: 0.2 10*3/uL (ref 0.0–0.5)
Eosinophils Relative: 5 %
HCT: 29 % — ABNORMAL LOW (ref 36.0–46.0)
Hemoglobin: 9.5 g/dL — ABNORMAL LOW (ref 12.0–15.0)
Immature Granulocytes: 0 %
Lymphocytes Relative: 19 %
Lymphs Abs: 0.9 10*3/uL (ref 0.7–4.0)
MCH: 30.5 pg (ref 26.0–34.0)
MCHC: 32.8 g/dL (ref 30.0–36.0)
MCV: 93.2 fL (ref 80.0–100.0)
Monocytes Absolute: 0.5 10*3/uL (ref 0.1–1.0)
Monocytes Relative: 10 %
Neutro Abs: 3 10*3/uL (ref 1.7–7.7)
Neutrophils Relative %: 66 %
Platelets: 117 10*3/uL — ABNORMAL LOW (ref 150–400)
RBC: 3.11 MIL/uL — ABNORMAL LOW (ref 3.87–5.11)
RDW: 13.5 % (ref 11.5–15.5)
WBC: 4.6 10*3/uL (ref 4.0–10.5)
nRBC: 0 % (ref 0.0–0.2)

## 2022-09-05 LAB — GLUCOSE, CAPILLARY
Glucose-Capillary: 132 mg/dL — ABNORMAL HIGH (ref 70–99)
Glucose-Capillary: 168 mg/dL — ABNORMAL HIGH (ref 70–99)
Glucose-Capillary: 163 mg/dL — ABNORMAL HIGH (ref 70–99)
Glucose-Capillary: 209 mg/dL — ABNORMAL HIGH (ref 70–99)

## 2022-09-05 LAB — BASIC METABOLIC PANEL
Anion gap: 3 — ABNORMAL LOW (ref 5–15)
BUN: 48 mg/dL — ABNORMAL HIGH (ref 8–23)
CO2: 29 mmol/L (ref 22–32)
Calcium: 7.7 mg/dL — ABNORMAL LOW (ref 8.9–10.3)
Chloride: 112 mmol/L — ABNORMAL HIGH (ref 98–111)
Creatinine, Ser: 1.8 mg/dL — ABNORMAL HIGH (ref 0.44–1.00)
GFR, Estimated: 28 mL/min — ABNORMAL LOW (ref >60)
Glucose, Bld: 154 mg/dL — ABNORMAL HIGH (ref 70–99)
Potassium: 4.4 mmol/L (ref 3.5–5.1)
Sodium: 144 mmol/L (ref 135–145)

## 2022-09-05 MED ORDER — OXYCODONE HCL 5 MG PO TABS
5.0000 mg | ORAL_TABLET | ORAL | Status: DC | PRN
  Administered 2022-09-05: 10 mg via ORAL
  Filled 2022-09-05: qty 2

## 2022-09-05 MED ORDER — TAMSULOSIN HCL 0.4 MG PO CAPS
0.4000 mg | ORAL_CAPSULE | Freq: Every day | ORAL | Status: DC
  Administered 2022-09-05 – 2022-09-30 (×26): 0.4 mg via ORAL
  Filled 2022-09-05 (×26): qty 1

## 2022-09-05 MED ORDER — ALUM & MAG HYDROXIDE-SIMETH 200-200-20 MG/5ML PO SUSP
15.0000 mL | Freq: Four times a day (QID) | ORAL | Status: DC | PRN
  Administered 2022-09-07 – 2022-09-19 (×6): 15 mL via ORAL
  Filled 2022-09-05 (×6): qty 30

## 2022-09-05 MED ORDER — ATORVASTATIN CALCIUM 40 MG PO TABS
40.0000 mg | ORAL_TABLET | Freq: Every day | ORAL | Status: DC
  Administered 2022-09-06 – 2022-10-01 (×26): 40 mg via ORAL
  Filled 2022-09-05 (×26): qty 1

## 2022-09-05 MED ORDER — INSULIN GLARGINE-YFGN 100 UNIT/ML ~~LOC~~ SOLN
6.0000 [IU] | Freq: Every day | SUBCUTANEOUS | Status: DC
  Administered 2022-09-05 – 2022-09-14 (×10): 6 [IU] via SUBCUTANEOUS
  Filled 2022-09-05 (×15): qty 0.06

## 2022-09-05 MED ORDER — DOCUSATE SODIUM 100 MG PO CAPS
100.0000 mg | ORAL_CAPSULE | Freq: Two times a day (BID) | ORAL | Status: DC
  Administered 2022-09-05 – 2022-10-01 (×52): 100 mg via ORAL
  Filled 2022-09-05 (×53): qty 1

## 2022-09-05 MED ORDER — SODIUM BICARBONATE 650 MG PO TABS
650.0000 mg | ORAL_TABLET | Freq: Two times a day (BID) | ORAL | Status: DC
  Administered 2022-09-05 – 2022-10-01 (×52): 650 mg via ORAL
  Filled 2022-09-05 (×52): qty 1

## 2022-09-05 MED ORDER — FUROSEMIDE 10 MG/ML IJ SOLN
40.0000 mg | Freq: Once | INTRAMUSCULAR | Status: AC
  Administered 2022-09-05: 40 mg via INTRAVENOUS
  Filled 2022-09-05: qty 4

## 2022-09-05 MED ORDER — LEVETIRACETAM 500 MG PO TABS
500.0000 mg | ORAL_TABLET | Freq: Two times a day (BID) | ORAL | Status: DC
  Administered 2022-09-05 – 2022-10-01 (×52): 500 mg via ORAL
  Filled 2022-09-05 (×52): qty 1

## 2022-09-05 MED ORDER — OXYCODONE HCL 5 MG PO TABS
10.0000 mg | ORAL_TABLET | ORAL | Status: DC | PRN
  Administered 2022-09-06 – 2022-10-01 (×70): 10 mg via ORAL
  Filled 2022-09-05 (×73): qty 2

## 2022-09-05 MED ORDER — LORAZEPAM 0.5 MG PO TABS
0.5000 mg | ORAL_TABLET | Freq: Four times a day (QID) | ORAL | Status: DC | PRN
  Administered 2022-09-09 – 2022-09-30 (×5): 0.5 mg via ORAL
  Filled 2022-09-05 (×5): qty 1

## 2022-09-05 MED ORDER — ONDANSETRON HCL 4 MG PO TABS: 4.0000 mg | ORAL_TABLET | Freq: Four times a day (QID) | ORAL | Status: DC | PRN

## 2022-09-05 MED ORDER — ONDANSETRON HCL 4 MG/2ML IJ SOLN: 4.0000 mg | Freq: Four times a day (QID) | INTRAMUSCULAR | Status: DC | PRN

## 2022-09-05 MED ORDER — TORSEMIDE 20 MG PO TABS
20.0000 mg | ORAL_TABLET | Freq: Every day | ORAL | Status: DC
  Administered 2022-09-06 – 2022-09-09 (×4): 20 mg via ORAL
  Filled 2022-09-05 (×4): qty 1

## 2022-09-05 MED ORDER — CAMPHOR-MENTHOL 0.5-0.5 % EX LOTN
TOPICAL_LOTION | CUTANEOUS | Status: DC | PRN
  Filled 2022-09-05: qty 222

## 2022-09-05 MED ORDER — VITAMIN D (ERGOCALCIFEROL) 1.25 MG (50000 UNIT) PO CAPS
50000.0000 [IU] | ORAL_CAPSULE | ORAL | Status: DC
  Administered 2022-09-06 – 2022-09-27 (×4): 50000 [IU] via ORAL
  Filled 2022-09-05 (×4): qty 1

## 2022-09-05 MED ORDER — VITAMIN D (ERGOCALCIFEROL) 1.25 MG (50000 UNIT) PO CAPS: 50000.0000 [IU] | ORAL_CAPSULE | ORAL | Status: DC

## 2022-09-05 MED ORDER — GLUCERNA SHAKE PO LIQD: 237.0000 mL | Freq: Two times a day (BID) | ORAL | 0 refills | Status: DC

## 2022-09-05 MED ORDER — TOUJEO MAX SOLOSTAR 300 UNIT/ML ~~LOC~~ SOPN: 6.0000 [IU] | PEN_INJECTOR | Freq: Every day | SUBCUTANEOUS | 0 refills | Status: DC

## 2022-09-05 MED ORDER — POLYETHYLENE GLYCOL 3350 17 G PO PACK: 17.0000 g | PACK | Freq: Every day | ORAL | 0 refills | Status: DC

## 2022-09-05 MED ORDER — INSULIN ASPART 100 UNIT/ML IJ SOLN
0.0000 [IU] | Freq: Three times a day (TID) | INTRAMUSCULAR | Status: DC
  Administered 2022-09-05 – 2022-09-06 (×3): 2 [IU] via SUBCUTANEOUS
  Administered 2022-09-06: 5 [IU] via SUBCUTANEOUS
  Administered 2022-09-07 (×2): 1 [IU] via SUBCUTANEOUS
  Administered 2022-09-07: 2 [IU] via SUBCUTANEOUS
  Administered 2022-09-08: 1 [IU] via SUBCUTANEOUS
  Administered 2022-09-08: 2 [IU] via SUBCUTANEOUS
  Administered 2022-09-08: 1 [IU] via SUBCUTANEOUS
  Administered 2022-09-09 (×2): 2 [IU] via SUBCUTANEOUS
  Administered 2022-09-10: 1 [IU] via SUBCUTANEOUS
  Administered 2022-09-10 – 2022-09-11 (×2): 2 [IU] via SUBCUTANEOUS
  Administered 2022-09-12: 3 [IU] via SUBCUTANEOUS
  Administered 2022-09-12: 1 [IU] via SUBCUTANEOUS
  Administered 2022-09-13 – 2022-09-15 (×3): 2 [IU] via SUBCUTANEOUS
  Administered 2022-09-15: 1 [IU] via SUBCUTANEOUS

## 2022-09-05 MED ORDER — INSULIN ASPART 100 UNIT/ML IJ SOLN: 0.0000 [IU] | Freq: Three times a day (TID) | INTRAMUSCULAR | 11 refills | Status: DC

## 2022-09-05 MED ORDER — CLOPIDOGREL BISULFATE 75 MG PO TABS
75.0000 mg | ORAL_TABLET | Freq: Every evening | ORAL | Status: DC
  Administered 2022-09-05 – 2022-09-30 (×26): 75 mg via ORAL
  Filled 2022-09-05 (×26): qty 1

## 2022-09-05 MED ORDER — METHOCARBAMOL 1000 MG/10ML IJ SOLN: 500.0000 mg | Freq: Four times a day (QID) | INTRAVENOUS | Status: DC | PRN

## 2022-09-05 MED ORDER — ACETAMINOPHEN 325 MG PO TABS
650.0000 mg | ORAL_TABLET | Freq: Four times a day (QID) | ORAL | Status: DC | PRN
  Administered 2022-09-06 – 2022-09-24 (×9): 650 mg via ORAL
  Filled 2022-09-05 (×11): qty 2

## 2022-09-05 MED ORDER — ISOSORBIDE MONONITRATE ER 30 MG PO TB24: 30.0000 mg | ORAL_TABLET | Freq: Every day | ORAL | Status: DC

## 2022-09-05 MED ORDER — OXYCODONE HCL 5 MG PO TABS
5.0000 mg | ORAL_TABLET | ORAL | Status: DC | PRN
  Administered 2022-09-06 – 2022-09-27 (×12): 5 mg via ORAL
  Filled 2022-09-05 (×13): qty 1

## 2022-09-05 MED ORDER — METHOCARBAMOL 500 MG PO TABS
500.0000 mg | ORAL_TABLET | Freq: Four times a day (QID) | ORAL | Status: DC | PRN
  Administered 2022-09-06 – 2022-09-15 (×21): 500 mg via ORAL
  Filled 2022-09-05 (×22): qty 1

## 2022-09-05 MED ORDER — SENNOSIDES-DOCUSATE SODIUM 8.6-50 MG PO TABS: 1.0000 | ORAL_TABLET | Freq: Every day | ORAL | Status: DC

## 2022-09-05 MED ORDER — IPRATROPIUM-ALBUTEROL 0.5-2.5 (3) MG/3ML IN SOLN
3.0000 mL | RESPIRATORY_TRACT | Status: DC | PRN
  Administered 2022-09-06 – 2022-09-18 (×15): 3 mL via RESPIRATORY_TRACT
  Filled 2022-09-05 (×15): qty 3

## 2022-09-05 MED ORDER — APIXABAN 2.5 MG PO TABS
2.5000 mg | ORAL_TABLET | Freq: Two times a day (BID) | ORAL | Status: DC
  Administered 2022-09-05 – 2022-09-08 (×7): 2.5 mg via ORAL
  Filled 2022-09-05 (×8): qty 1

## 2022-09-05 MED ORDER — ADULT MULTIVITAMIN W/MINERALS CH
1.0000 | ORAL_TABLET | Freq: Every day | ORAL | Status: DC
  Administered 2022-09-06 – 2022-10-01 (×26): 1 via ORAL
  Filled 2022-09-05 (×26): qty 1

## 2022-09-05 MED ORDER — POLYETHYLENE GLYCOL 3350 17 G PO PACK
17.0000 g | PACK | Freq: Every day | ORAL | Status: DC
  Administered 2022-09-06 – 2022-10-01 (×25): 17 g via ORAL
  Filled 2022-09-05 (×26): qty 1

## 2022-09-05 MED ORDER — POLYETHYLENE GLYCOL 3350 17 G PO PACK
17.0000 g | PACK | Freq: Every day | ORAL | Status: DC | PRN
  Administered 2022-09-17: 17 g via ORAL

## 2022-09-05 MED ORDER — SENNOSIDES-DOCUSATE SODIUM 8.6-50 MG PO TABS
1.0000 | ORAL_TABLET | Freq: Two times a day (BID) | ORAL | Status: DC
  Administered 2022-09-05 – 2022-10-01 (×52): 1 via ORAL
  Filled 2022-09-05 (×52): qty 1

## 2022-09-05 NOTE — Progress Notes (Addendum)
Inpatient Rehab Admissions Coordinator:   Addendum: We have bed available today and patient is medically ready to admit.   Appears that we have approval from worker's comp to admit patient to CIR. Will continue to follow for medical readiness and bed availability.   Rehab Admissons Coordinator [place], [place], [place] [phone]

## 2022-09-05 NOTE — Progress Notes (Signed)
Inpatient Rehabilitation Admission Medication Review by a Pharmacist  A complete drug regimen review was completed for this patient to identify any potential clinically significant medication issues.  High Risk Drug Classes Is patient taking? Indication by Medication  Antipsychotic No   Anticoagulant Yes Eliquis -PAF  Antibiotic No   Opioid Yes Oxycodone- pain; h/o chronic pack pain  Antiplatelet Yes Plavix-CAD  Hypoglycemics/insulin Yes Insulin Semglee, SSI aspart- DM  Vasoactive Medication No   Chemotherapy No   Other Yes Atorvastatin-HLD Albuterol- COPD Keppra- seizure disorder Lorazepam- anxiety Tamsulosin-urinary retention     Type of Medication Issue Identified Description of Issue Recommendation(s)  Drug Interaction(s) (clinically significant)     Duplicate Therapy     Allergy     No Medication Administration End Date     Incorrect Dose     Additional Drug Therapy Needed     Significant med changes from prior encounter (inform family/care partners about these prior to discharge). Jardiance,  Imdur, Creon, Metoprolol XL, Potassium po , Valsartan, torsemide, Breo Ellipta MDI, Femara (letrozole) Restart PTA meds when and if necessary during CIR admission or at time of discharge, if warranted   Other       Clinically significant medication issues were identified that warrant physician communication and completion of prescribed/recommended actions by midnight of the next day:  No  Name of provider notified for urgent issues identified:   Provider Method of Notification:    Pharmacist comments:   Time spent performing this drug regimen review (minutes):  [place], RPh Clinical Pharmacist  09/05/2022 4:22 PM

## 2022-09-05 NOTE — Discharge Summary (Addendum)
Discharge Summary  [name] TKF:[id number] DOB: 22-Oct-1941  PCP: [name], Catherine Patricia, MD  Admit date: 08/29/2022 Discharge date: 09/05/2022  30 Day Unplanned Readmission Risk Score    Flowsheet Row ED to Hosp-Admission (Current) from 08/29/2022 in [facility] PROGRESSIVE CARE  30 Day Unplanned Readmission Risk Score (%) 33.04 Filed at 09/05/2022 0801       This score is the patient's risk of an unplanned readmission within 30 days of being discharged (0 -100%). The score is based on dignosis, age, lab data, medications, orders, and past utilization.   Low:  0-14.9   Medium: 15-21.9   High: 22-29.9   Extreme: 30 and above         Time spent: , more than 50% time spent on coordination of care.   Patient is to discharge to CIR, please do voiding trial in the next 1-2 days.  Recommendations for Outpatient Follow-up after CIR stay:  F/u with PCP within a week  for hospital discharge follow up, repeat cbc/bmp at follow up F/u with ortho F/u with cardiology as scheduled   F/u with oncology as scheduled   Discharge Diagnoses:  Active Hospital Problems   Diagnosis Date Noted   Hip fracture (HCC) 08/29/2022   Acute urinary retention 09/01/2022   Paroxysmal atrial fibrillation (HCC)    Acute renal failure superimposed on stage 4 chronic kidney disease (HCC) 12/06/2021   Seizure (HCC) 12/05/2021   Carcinoma of upper-outer quadrant of left breast in female, estrogen receptor positive (HCC) 10/17/2019   Type 2 diabetes mellitus with hyperglycemia, with long-term current use of insulin (HCC) 05/04/2019   Port catheter in place 05/30/2016   CKD (chronic kidney disease) stage 4, GFR 15-29 ml/min (HCC) 05/24/2014   AML (acute myeloblastic leukemia) (HCC) 05/24/2014   Chronic back pain 02/13/2014   Hyperlipidemia 12/24/2007   Essential hypertension 07/13/2007    Resolved Hospital Problems  No resolved problems to display.    Discharge Condition: stable  Diet  recommendation: heart healthy/carb modified  Filed Weights   08/30/22 0753 08/31/22 0332 09/01/22 0658  Weight: 72.5 kg 80.4 kg 81.1 kg    History of present illness: ( per admitting MD Dr [name]) [name] is a 81 y.o. female with medical history significant of AML (remote); afib on Eliquis; chronic back pain; COPD; CAD; DM; HTN; HLD; chronic diastolic CHF; uterine/breast cancer; and CAD presenting with a fall, Working and tripped.  R hip fracture, Head CT negative, ortho consulted   Hospital Course:  Principal Problem:   Hip fracture (HCC) Active Problems:   Hyperlipidemia   Essential hypertension   Chronic back pain   CKD (chronic kidney disease) stage 4, GFR 15-29 ml/min (HCC)   AML (acute myeloblastic leukemia) (HCC)   Port catheter in place   Type 2 diabetes mellitus with hyperglycemia, with long-term current use of insulin (HCC)   Carcinoma of upper-outer quadrant of left breast in female, estrogen receptor positive (HCC)   Seizure (HCC)   Acute renal failure superimposed on stage 4 chronic kidney disease (HCC)   Paroxysmal atrial fibrillation (HCC)   Acute urinary retention   Right hip fracture after mechanical fall,  reports tripped by a rug at work -underwent surgical intervention on 08/30/2022. Management per ortho - Eliquis has been resumed. -Fall precautions -PT/OT recommend CIR and accepted today. -Follow-up x-rays right hip and 1 week             Removal of sutures around 09/12/2022  Follow-up with orthopedic trauma service upon discharge    severe vitamin D deficiency.  25 OH vitamin D is 4.88 ng/mL.  Supplement   Acute urinary retention -Possibly from recent surgery and immobility.  3 unsuccessful attempts at in and out cath overnight on 09/01/2022.  Foley catheter was subsequently placed on 9/25 Voiding trial in 1-2 days while in CIR   Acute kidney injury on chronic kidney disease stage IV/Acute metabolic acidosis -Baseline creatinine around  1.5-1.9.  Creatinine peaked at  3.85, Renal ultrasound negative for hydronephrosis. -acidosis resolved, off bicarb drip.  Appreciate nephrology input -Cr back to baseline    Chronic diastolic CHF -Echo on 03/21/2394 had shown EF of 60 to 65%.   -Losartan and Jardiance held in the hospital due to [facility], aki resolved, meds resumed at discharge -appear volume overloaded towards the end of  hospital stay,   iv lasix on 9/28 and 9/29,  resume home meds torsamide at discharge -continue monitor volume status, wean oxygen  Persistent A-fib -rate mostly controlled with Mild intermittent bradycardia noted.  Eliquis has been resumed postprocedure.  CAD -Status post history of stenting -no chest pain. -Continue Imdur.  Resume Plavix ,statin   Hypertension -bp meds held around 9/25 due to low normal bp, now bp is elevated, gradually restart home bp meds -continue monitor bp at CIR, adjust bp meds as needed   Diabetes mellitus type II with hyperglycemia and hypoglycemia -Has had episodes of hypoglycemia likely due to poor oral intake, insulin dose adjusted -need to continue adjust insulin during CIR stay   Hyperlipidemia -Continue statin        Anemia of chronic disease--  Hemoglobin stable.  Monitor intermittently   Thrombocytopenia, mild  -per Dr [name] likely related to cirrhosis with the anemia in part due to renal insufficiency.    Monitor intermittently.  No signs of bleeding currently.    Seizure disorder -Continue Keppra   COPD -Stable.  Continue albuterol as needed   Chronic pain -Continue current pain management.  Outpatient follow-up with PCP/pain management   She is followed by Dr. Ardis [name] for the cystic pancreas head mass, unchanged on a CT 04/17/2022. History of AML in remission and breast cancer in remission -Outpatient follow-up with oncology/Dr. Benay [name].  resume Letrozole. Per Dr [name] "Bone lesions "noted on repeat imaging studies are likely related to changes  from treatment of AML. [name] would like to keep the Port-A-Cath in place.  She will return for Port-A-Cath flush in 8 weeks.    Discharge Exam: BP 135/76 (BP Location: Left Arm)   Pulse 81   Temp 98.4 F (36.9 C)   Resp 20   Ht 5' 2.01" (1.575 m)   Wt 81.1 kg   SpO2 96%   BMI 32.69 kg/m   General: frail, aaox3, NAD Cardiovascular: IRRR, bilateral lower extremity pitting edema is improving Respiratory: normal respiratory effort     Discharge Instructions     Diet - low sodium heart healthy   Complete by: As directed    Carb modified diet, fluids restriction 1200cc   Discharge wound care:   Complete by: As directed    Per ortho instruction   Increase activity slowly   Complete by: As directed       Allergies as of 09/05/2022   No Known Allergies      Medication List     STOP taking these medications    MAGNESIUM OXIDE 400 PO   NovoLOG FlexPen 100 UNIT/ML FlexPen Generic drug: insulin  aspart Replaced by: insulin aspart 100 UNIT/ML injection   potassium chloride 10 MEQ tablet Commonly known as: KLOR-CON       TAKE these medications    atorvastatin 40 MG tablet Commonly known as: LIPITOR TAKE 1 TABLET EVERY DAY What changed: when to take this   BD SafetyGlide Syringe/Needle 25G X 1" 3 ML Misc Generic drug: SYRINGE-NEEDLE (DISP) 3 ML Use for B12 injections   clopidogrel 75 MG tablet Commonly known as: PLAVIX TAKE 1 TABLET EVERY EVENING   cyanocobalamin 1000 MCG/ML injection Commonly known as: VITAMIN B12 INJECT 1ML IN THE MUSCLE EVERY 30 DAYS AS DIRECTED   Eliquis 2.5 MG Tabs tablet Generic drug: apixaban Take 1 tablet (2.5 mg total) by mouth 2 (two) times daily.   empagliflozin 25 MG Tabs tablet Commonly known as: JARDIANCE Take 1 tablet (25 mg total) by mouth daily.   feeding supplement (GLUCERNA SHAKE) Liqd Take 237 mLs by mouth 2 (two) times daily between meals.   fluticasone furoate-vilanterol 200-25 MCG/ACT Aepb Commonly known  as: BREO ELLIPTA Inhale 1 puff into the lungs as needed (wheezing SOB).   HYDROcodone-acetaminophen 5-325 MG tablet Commonly known as: Norco Take 1 tablet by mouth every 6 (six) hours as needed for moderate pain.   insulin aspart 100 UNIT/ML injection Commonly known as: novoLOG Inject 0-9 Units into the skin 3 (three) times daily with meals. Before each meal 3 times a day, 140-199 - 2 units, 200-250 - 4 units, 251-299 - 6 units,  300-349 - 8 units,  350 or above 10 units. Insulin PEN if approved, provide syringes and needles if needed. Replaces: NovoLOG FlexPen 100 UNIT/ML FlexPen   Insulin Pen Needle 32G X 4 MM Misc 1 Device by Does not apply route in the morning, at noon, in the evening, and at bedtime.   isosorbide mononitrate 30 MG 24 hr tablet Commonly known as: IMDUR Take 1 tablet (30 mg total) by mouth daily. TAKE 1 AND 1/2 TABLETS EVERY DAY  Hold if sbp less than 100 Start taking on: September 09, 2022 What changed:  additional instructions These instructions start on September 09, 2022. If you are unsure what to do until then, ask your doctor or other care provider.   letrozole 2.5 MG tablet Commonly known as: FEMARA Take 1 tablet (2.5 mg total) by mouth daily.   levETIRAcetam 500 MG tablet Commonly known as: Keppra Take 1 tablet (500 mg total) by mouth 2 (two) times daily.   lidocaine 4 % Place 1 patch onto the skin daily.   lipase/protease/amylase 36000 UNITS Cpep capsule Commonly known as: Creon Take 2 capsules (72,000 Units total) by mouth 3 (three) times daily with meals. What changed:  when to take this reasons to take this   LORazepam 0.5 MG tablet Commonly known as: ATIVAN TAKE 1 TABLET BY MOUTH EVERY 8 HOURS AS NEEDED FOR ANXIETY(DO NOT EXCEED MORE THAN 2 TABS IN 24 HRS)   metoprolol succinate 25 MG 24 hr tablet Commonly known as: TOPROL-XL Take 0.5 tablets (12.5 mg total) by mouth daily. Take with or immediately following a meal.   nitroGLYCERIN 0.4 MG  SL tablet Commonly known as: NITROSTAT PLACE 1 TABLET (0.4 MG TOTAL) UNDER THE TONGUE EVERY 5 (FIVE) MINUTES AS NEEDED FOR CHEST PAIN.   polyethylene glycol 17 g packet Commonly known as: MIRALAX / GLYCOLAX Take 17 g by mouth daily for 14 days. What changed:  when to take this reasons to take this   senna-docusate 8.6-50 MG tablet Commonly  known as: Senokot-S Take 1 tablet by mouth at bedtime for 7 days.   torsemide 20 MG tablet Commonly known as: DEMADEX Take 2 tablets (40 mg total) by mouth daily. What changed: Another medication with the same name was removed. Continue taking this medication, and follow the directions you see here.   Toujeo Max SoloStar 300 UNIT/ML Solostar Pen Generic drug: insulin glargine (2 Unit Dial) Inject 6 Units into the skin at bedtime. What changed: how much to take   True Metrix Air Glucose Meter w/Device Kit USE AS DIRECTED   True Metrix Blood Glucose Test test strip Generic drug: glucose blood TEST BLOOD SUGAR THREE TIMES DAILY   valsartan 80 MG tablet Commonly known as: Diovan Take 1 tablet (80 mg total) by mouth daily.   Vitamin D (Ergocalciferol) 1.25 MG (50000 UNIT) Caps capsule Commonly known as: DRISDOL Take 1 capsule (50,000 Units total) by mouth every 7 (seven) days. Start taking on: September 06, 2022               Discharge Care Instructions  (From admission, onward)           Start     Ordered   09/05/22 0000  Discharge wound care:       Comments: Per ortho instruction   09/05/22 1103           No Known Allergies    The results of significant diagnostics from this hospitalization (including imaging, microbiology, ancillary and laboratory) are listed below for reference.    Significant Diagnostic Studies: US RENAL  Result Date: 08/31/2022 CLINICAL DATA:  Renal failure EXAM: RENAL / URINARY TRACT ULTRASOUND COMPLETE COMPARISON:  None Available. FINDINGS: Right Kidney: Renal measurements: 10.8 x 5.7 x  5.2 cm = volume: 160 a mL. Contains a 1.7 cm cyst. No follow-up imaging recommended for the cyst. Subjectively thinned cortex. Increased cortical echogenicity. Left Kidney: Renal measurements: 9.5 x 5.1 x 5.7 cm = volume: 145 mL. Subjectively thinned cortex. Increased cortical echogenicity. Bladder: Appears normal for degree of bladder distention. Other: None. IMPRESSION: Medical renal disease with increased cortical echogenicity bilaterally. No other significant abnormalities. Electronically Signed   By: Dorise Bullion III M.D.   On: 08/31/2022 13:06   DG Hand Complete Right  Result Date: 08/30/2022 CLINICAL DATA:  Proximal thumb pain. EXAM: RIGHT HAND - COMPLETE 3+ VIEW COMPARISON:  None Available. FINDINGS: There is no evidence of fracture or dislocation. Osteoarthritic changes at the first carpo/metacarpal joint with joint space narrowing and subchondral sclerosis. IMPRESSION: Osteoarthritic changes at the first carpo/metacarpal joint. Electronically Signed   By: Fidela Salisbury M.D.   On: 08/30/2022 15:02   DG HIP UNILAT WITH PELVIS 2-3 VIEWS RIGHT  Result Date: 08/30/2022 CLINICAL DATA:  Postop. EXAM: DG HIP (WITH OR WITHOUT PELVIS) 2-3V RIGHT COMPARISON:  Preoperative imaging. FINDINGS: Femoral intramedullary nail is distal locking and trans trochanteric screw fixation of intertrochanteric femur fracture. Fracture line is not well visible, although alignment is unchanged. No new or periprosthetic fracture. Recent postsurgical change includes air and edema in the soft tissues. IMPRESSION: ORIF of right intertrochanteric femur fracture, no immediate postoperative complication. Electronically Signed   By: Keith Rake M.D.   On: 08/30/2022 10:46   DG FEMUR, MIN 2 VIEWS RIGHT  Result Date: 08/30/2022 CLINICAL DATA:  IM nail. EXAM: RIGHT FEMUR 2 VIEWS COMPARISON:  Preoperative radiograph. FINDINGS: Seven fluoroscopic spot views of the right femur obtained in the operating room. Interval  intramedullary nail with trans trochanteric and distal  locking screw fixation of proximal femur fracture. There is also a single fluoroscopic spot view of the ankle. Fluoroscopy time 67 seconds. Dose 19.32 mGy. IMPRESSION: Intraoperative fluoroscopy during ORIF of right femur fracture. Electronically Signed   By: Keith Rake M.D.   On: 08/30/2022 10:16   CT Hip Right Wo Contrast  Result Date: 08/29/2022 CLINICAL DATA:  Hip trauma, fracture suspected, xray done r/o occult hip or pelvic/pubic rami fx EXAM: CT OF THE RIGHT HIP WITHOUT CONTRAST TECHNIQUE: Multidetector CT imaging of the right hip was performed according to the standard protocol. Multiplanar CT image reconstructions were also generated. RADIATION DOSE REDUCTION: This exam was performed according to the departmental dose-optimization program which includes automated exposure control, adjustment of the mA and/or kV according to patient size and/or use of iterative reconstruction technique. COMPARISON:  Same day radiographs FINDINGS: Bones/Joint/Cartilage There is a nondisplaced fracture of the intertrochanteric right proximal femur, and comminuted involvement of the greater trochanter. Unchanged diffuse mixed sclerosis and lucency within visible pelvis. Ligaments Suboptimally assessed by CT. Muscles and Tendons No acute myotendinous abnormality by CT. Soft tissues No focal fluid collection.  Soft tissue swelling along the hip. IMPRESSION: Nondisplaced intertrochanteric fracture of the right proximal femur, with comminuted involvement of the greater trochanter. Unchanged diffuse mixed sclerosis and lucency within the visible pelvis, suggestive of metastatic disease, as noted on prior exams. Electronically Signed   By: Maurine Simmering M.D.   On: 08/29/2022 15:56   CT HEAD WO CONTRAST (5MM)  Result Date: 08/29/2022 CLINICAL DATA:  Trauma, fall EXAM: CT HEAD WITHOUT CONTRAST TECHNIQUE: Contiguous axial images were obtained from the base of the skull  through the vertex without intravenous contrast. RADIATION DOSE REDUCTION: This exam was performed according to the departmental dose-optimization program which includes automated exposure control, adjustment of the mA and/or kV according to patient size and/or use of iterative reconstruction technique. COMPARISON:  12/05/2021 FINDINGS: Brain: No acute intracranial findings are seen in noncontrast CT brain. There are no signs of bleeding within the cranium. Cortical sulci are prominent. There is decreased density in periventricular and subcortical white matter. There is 1.7 cm dense calcification inseparable from the anterior falx, possibly meningioma with no significant interval change. There is no adjacent edema. Vascular: There are scattered arterial calcifications. Skull: No acute findings are seen. Sinuses/Orbits: There is mild mucosal thickening in ethmoid and sphenoid sinuses. Other: None. IMPRESSION: No acute intracranial findings are seen. Atrophy. Small-vessel disease. There is 1.7 cm dense calcification inseparable from anterior falx suggesting meningioma with no significant interval change. Electronically Signed   By: Elmer Picker M.D.   On: 08/29/2022 15:45   DG FEMUR, MIN 2 VIEWS RIGHT  Result Date: 08/29/2022 CLINICAL DATA:  [place] [address] from standing while at work.  Right hip pain. EXAM: RIGHT FEMUR 2 VIEWS COMPARISON:  Right hip radiographs 08/29/2022, CT abdomen and pelvis 04/17/2022 FINDINGS: Mild-to-moderate medial compartment of the knee joint space narrowing. There is subtle linear lucency overlying the right intertrochanteric region on lateral view. Mild chronic enthesopathic change at the quadriceps insertion on the patella. No joint effusion. Mild right femoroacetabular joint space narrowing and peripheral acetabular degenerative osteophytosis. There are dense vascular calcifications. IMPRESSION: There is craniocaudal linear lucency overlying the superior intertrochanteric region on  lateral view only. This could represent an overlying skin marking. It is difficult to exclude an acute nondisplaced fracture in this region. Electronically Signed   By: Yvonne Kendall M.D.   On: 08/29/2022 15:27   DG Tibia/Fibula Right  Result  Date: 08/29/2022 CLINICAL DATA:  Trauma, fall EXAM: RIGHT TIBIA AND FIBULA - 2 VIEW COMPARISON:  None Available. FINDINGS: No recent fracture or dislocation is seen. Osteopenia is seen in bony structures. There is soft tissue swelling around the ankle. There is edema in subcutaneous plane in the lower leg. IMPRESSION: No fracture or dislocation is seen in right tibia and fibula. Electronically Signed   By: Elmer Picker M.D.   On: 08/29/2022 15:24   DG Chest 1 View  Result Date: 08/29/2022 CLINICAL DATA:  Fall.  RIGHT-sided severe pain EXAM: CHEST  1 VIEW COMPARISON:  04/16/2022 FINDINGS: RIGHT-sided port. Enlarged cardiac silhouette. RIGHT pleural effusion. No pneumothorax. No fracture. IMPRESSION: 1. Chronic RIGHT effusion. 2. Cardiomegaly. 3. No acute chest trauma. Electronically Signed   By: Suzy Bouchard M.D.   On: 08/29/2022 14:04   DG HIP UNILAT W OR W/O PELVIS 2-3 VIEWS RIGHT  Result Date: 08/29/2022 CLINICAL DATA:  Fall, right hip pain EXAM: DG HIP (WITH OR WITHOUT PELVIS) 2-3V RIGHT COMPARISON:  04/17/2022 FINDINGS: Bones appear diffusely demineralized. There is no evidence of hip fracture or dislocation. Joint spaces are relatively well preserved. Atherosclerotic vascular calcifications. IMPRESSION: No evidence of right hip fracture or dislocation. Electronically Signed   By: Davina Poke D.O.   On: 08/29/2022 14:02    Microbiology: Recent Results (from the past 240 hour(s))  MRSA Next Gen by PCR, Nasal     Status: None   Collection Time: 08/29/22  9:30 PM   Specimen: Nasal Mucosa; Nasal Swab  Result Value Ref Range Status   MRSA by PCR Next Gen NOT DETECTED NOT DETECTED Final    Comment: (NOTE) The GeneXpert MRSA Assay (FDA  approved for NASAL specimens only), is one component of a comprehensive MRSA colonization surveillance program. It is not intended to diagnose MRSA infection nor to guide or monitor treatment for MRSA infections. Test performance is not FDA approved in patients less than 64 years old. Performed at [facility], [address] [place], [place] [address]      Labs: Basic Metabolic Panel: Recent Labs  Lab 08/31/22 0533 09/01/22 0540 09/02/22 0325 09/03/22 0322 09/04/22 1148 09/05/22 0646  NA 141 141 140 143 146* 144  K 3.8 4.1 3.6 3.6 4.2 4.4  CL 117* 115* 112* 110 113* 112*  CO2 18* 16* 21* 22 20* 29  GLUCOSE 162* 120* 230* 90 165* 154*  BUN 51* 56* 61* 57* 52* 48*  CREATININE 3.24* 3.85* 3.54* 2.71* 2.02* 1.80*  CALCIUM 7.5* 7.5* 7.1* 7.5* 7.6* 7.7*  MG 2.1 2.3 2.5* 2.7*  --   --    Liver Function Tests: No results for input(s): "AST", "ALT", "ALKPHOS", "BILITOT", "PROT", "ALBUMIN" in the last 168 hours. No results for input(s): "LIPASE", "AMYLASE" in the last 168 hours. No results for input(s): "AMMONIA" in the last 168 hours. CBC: Recent Labs  Lab 08/31/22 0533 09/01/22 0540 09/02/22 0325 09/03/22 0322 09/05/22 0646  WBC 7.5 8.2 5.4 5.7 4.6  NEUTROABS 6.0  --  3.7 4.2 3.0  HGB 9.2* 8.9* 8.7* 9.7* 9.5*  HCT 29.9* 27.7* 27.2* 28.1* 29.0*  MCV 96.5 95.8 94.1 91.2 93.2  PLT 89* 97* 92* 111* 117*   Cardiac Enzymes: No results for input(s): "CKTOTAL", "CKMB", "CKMBINDEX", "TROPONINI" in the last 168 hours. BNP: BNP (last 3 results) Recent Labs    04/16/22 2022 05/08/22 1555  BNP 549.2* 447.5*    ProBNP (last 3 results) No results for input(s): "PROBNP" in the last  8760 hours.  CBG: Recent Labs  Lab 09/04/22 1112 09/04/22 1612 09/04/22 2115 09/05/22 0621 09/05/22 1116  GLUCAP 113* 173* 166* 132* 168*    FURTHER DISCHARGE INSTRUCTIONS:   Get Medicines reviewed and adjusted: Please take all your medications with you for your next visit with  your Primary MD   Laboratory/radiological data: Please request your Primary MD to go over all hospital tests and procedure/radiological results at the follow up, please ask your Primary MD to get all Hospital records sent to his/her office.   In some cases, they will be blood work, cultures and biopsy results pending at the time of your discharge. Please request that your primary care M.D. goes through all the records of your hospital data and follows up on these results.   Also Note the following: If you experience worsening of your admission symptoms, develop shortness of breath, life threatening emergency, suicidal or homicidal thoughts you must seek medical attention immediately by calling 911 or calling your MD immediately  if symptoms less severe.   You must read complete instructions/literature along with all the possible adverse reactions/side effects for all the Medicines you take and that have been prescribed to you. Take any new Medicines after you have completely understood and accpet all the possible adverse reactions/side effects.    Do not drive when taking Pain medications or sleeping medications (Benzodaizepines)   Do not take more than prescribed Pain, Sleep and Anxiety Medications. It is not advisable to combine anxiety,sleep and pain medications without talking with your primary care practitioner   Special Instructions: If you have smoked or chewed Tobacco  in the last 2 yrs please stop smoking, stop any regular Alcohol  and or any Recreational drug use.   Wear Seat belts while driving.   Please note: You were cared for by a hospitalist during your hospital stay. Once you are discharged, your primary care physician will handle any further medical issues. Please note that NO REFILLS for any discharge medications will be authorized once you are discharged, as it is imperative that you return to your primary care physician (or establish a relationship with a primary care  physician if you do not have one) for your post hospital discharge needs so that they can reassess your need for medications and monitor your lab values.     Signed:  Florencia Reasons MD, PhD, FACP  [facility] 09/05/2022, 11:39 AM

## 2022-09-05 NOTE — Progress Notes (Signed)
Discharged to [facility] room 4 by bed awake and alert. Belongings with pt.

## 2022-09-05 NOTE — Progress Notes (Signed)
PMR Admission Coordinator Pre-Admission Assessment   Patient: [name] is an 81 y.o., female MRN: [id number] DOB: Apr 18, 1941 Height: 5' 2.01" (157.5 cm) Weight: 81.1 kg   Insurance Information HMO: Yes     PRIMARY: Forensic scientist  (worker's comp)    Case #: [id number]     Subscriber: Patient CM Name: Catherine Lax, RN CCM     Phone#: [phone] (cell) [phone] ( office)     Fax#: [phone]  -Please send updates weekly- starting 08/14/34 Pre-Cert#: [id number]         The "Data Collection Information Summary" for patients in Inpatient Rehabilitation Facilities with attached "Privacy Act [place] Records" was provided and verbally reviewed with: Patient and Family   Emergency Contact Information Contact Information       Name Relation Home Work Mobile    [name] Daughter     [phone]    Catherine, [name] [phone]        Catherine,[name] Brother     [phone]    [name]     [phone]           Current Medical History  Patient Admitting Diagnosis: Hip fracture, acute kidney injury History of Present Illness: Catherine Thielke. [name] is a 81 year old right-handed female with history of myelogenous leukemia in remission, atrial fibrillation on Eliquis, chronic back pain, seizure disorder maintained on Keppra, COPD, quit smoking 21 years ago,, CAD with stenting maintained on Plavix, diabetes mellitus, hypertension, chronic diastolic congestive heart failure, uterine/breast cancer, CKD stage III.  Per chart review patient lives with spouse.  1 level home one-step to entry.  Independent prior to admission and driving.  She still works Public librarian.  Presented 08/29/2022 after sustaining ground-level fall while at work.  No loss of consciousness.  Cranial CT scan negative for acute changes.  X-rays and imaging revealed right intertrochanteric hip fracture.  Underwent intramedullary nailing of right hip 08/30/2022 per Dr. Marcelino Scot..   Weightbearing as tolerated right lower extremity.  She continues on chronic Eliquis as prior to admission as well as her Plavix.  Acute blood loss anemia 9.5 and monitored.  Renal services for history of AKI on CKD creatinine baseline 2.41 on discharge peaking at 3.8.  She did initially receive gentle IV fluids.  Renal ultrasound no hydronephrosis and latest creatinine 1.80.  Bouts of urinary retention Foley tube placed 9/25 and placed on Flomax.  Therapy evaluations completed due to patient decreased functional mobility was admitted for a comprehensive rehab program.      Patient's medical record from [facility] has been reviewed by the rehabilitation admission coordinator and physician.   Past Medical History      Past Medical History:  Diagnosis Date   Acute myelogenous leukemia ([place]) 02/2014   Acute pancreatitis     Arthritis      "back" (09/17/2017)   Atrial flutter ([facility])     Cardiomyopathy ([place])     Chronic kidney disease, stage II (mild)     Chronic lower back pain     Colon polyps 04/29/2010    TUBULAR ADENOMA AND A SERRATED ADENOMA   COPD (chronic obstructive pulmonary disease) ([place])     CORONARY ARTERY DISEASE 12/24/2007   DIABETES MELLITUS, TYPE II 07/13/2007   History of blood transfusion 2015    "related to leukemia"   History of kidney stones 12/24/2007   History of uterine cancer     HYPERLIPIDEMIA 12/24/2007   HYPERTENSION 07/13/2007   NSTEMI (non-ST elevated myocardial infarction) ([place]) 09/17/2017  Uterine cancer [facility])        Has the patient had major surgery during 100 days prior to admission? Yes   Family History   family history includes Breast cancer (age of onset: 75) in her sister; COPD in her father; Cancer in her maternal uncle, niece, paternal aunt, and paternal uncle; Cervical cancer in her sister; Cirrhosis in her maternal uncle; Congestive Heart Failure in her mother; Esophageal cancer in her brother and father; Heart attack in her maternal grandfather,  maternal grandmother, and paternal grandfather; Leukemia in her cousin; Lung cancer in her sister, sister, and sister; Skin cancer in her daughter and maternal grandfather; Stroke (age of onset: 79) in her paternal grandmother; Suicidality in her brother.   Current Medications   Current Facility-Administered Medications:    acetaminophen (TYLENOL) tablet 1,000 mg, 1,000 mg, Oral, Q8H, Ainsley Spinner, PA-C, 1,000 mg at 09/02/22 1455   alum & mag hydroxide-simeth (MAALOX/MYLANTA) 200-200-20 MG/5ML suspension 15 mL, 15 mL, Oral, Q6H PRN, Starla Link, Kshitiz, MD, 15 mL at 09/02/22 1141   apixaban (ELIQUIS) tablet 2.5 mg, 2.5 mg, Oral, BID, Wise, Nason S, RPH, 2.5 mg at 09/02/22 1001   atorvastatin (LIPITOR) tablet 40 mg, 40 mg, Oral, Daily, Ainsley Spinner, PA-C, 40 mg at 09/02/22 1001   bisacodyl (DULCOLAX) EC tablet 5 mg, 5 mg, Oral, Daily PRN, Ainsley Spinner, PA-C   Chlorhexidine Gluconate Cloth 2 % PADS 6 each, 6 each, Topical, Q0600, Ainsley Spinner, PA-C, 6 each at 09/02/22 0610   docusate sodium (COLACE) capsule 100 mg, 100 mg, Oral, BID, Ainsley Spinner, PA-C, 100 mg at 09/02/22 1001   feeding supplement (GLUCERNA SHAKE) (GLUCERNA SHAKE) liquid 237 mL, 237 mL, Oral, BID BM, Alekh, Kshitiz, MD, 237 mL at 08/31/22 1600   HYDROmorphone (DILAUDID) injection 1 mg, 1 mg, Intravenous, Q2H PRN, Ainsley Spinner, PA-C, 1 mg at 09/02/22 0958   insulin aspart (novoLOG) injection 0-15 Units, 0-15 Units, Subcutaneous, TID WC, Ainsley Spinner, PA-C, 5 Units at 09/02/22 9798   insulin aspart (novoLOG) injection 0-5 Units, 0-5 Units, Subcutaneous, QHS, Ainsley Spinner, PA-C   insulin glargine-yfgn (SEMGLEE) injection 8 Units, 8 Units, Subcutaneous, QHS, Alekh, Kshitiz, MD   lactated ringers infusion, , Intravenous, Continuous, Pearson Grippe B, MD, Last Rate: 75 mL/hr at 09/02/22 1500, Infusion Verify at 09/02/22 1500   levETIRAcetam (KEPPRA) tablet 500 mg, 500 mg, Oral, BID, Ainsley Spinner, PA-C, 500 mg at 09/02/22 1001   LORazepam (ATIVAN) tablet  0.5 mg, 0.5 mg, Oral, Q6H PRN, Ainsley Spinner, PA-C, 0.5 mg at 09/01/22 9211   menthol-cetylpyridinium (CEPACOL) lozenge 3 mg, 1 lozenge, Oral, PRN **OR** phenol (CHLORASEPTIC) mouth spray 1 spray, 1 spray, Mouth/Throat, PRN, Ainsley Spinner, PA-C   methocarbamol (ROBAXIN) tablet 500 mg, 500 mg, Oral, Q6H PRN, 500 mg at 08/30/22 1748 **OR** methocarbamol (ROBAXIN) 500 mg in dextrose 5 % 50 mL IVPB, 500 mg, Intravenous, Q6H PRN, Ainsley Spinner, PA-C   metoCLOPramide (REGLAN) tablet 5-10 mg, 5-10 mg, Oral, Q8H PRN **OR** metoCLOPramide (REGLAN) injection 5-10 mg, 5-10 mg, Intravenous, Q8H PRN, Ainsley Spinner, PA-C   multivitamin with minerals tablet 1 tablet, 1 tablet, Oral, Daily, Alekh, Kshitiz, MD, 1 tablet at 09/02/22 1001   ondansetron (ZOFRAN) tablet 4 mg, 4 mg, Oral, Q6H PRN **OR** ondansetron (ZOFRAN) injection 4 mg, 4 mg, Intravenous, Q6H PRN, Ainsley Spinner, PA-C   Oral care mouth rinse, 15 mL, Mouth Rinse, PRN, Ainsley Spinner, PA-C   oxyCODONE (Oxy IR/ROXICODONE) immediate release tablet 5-10 mg, 5-10 mg, Oral, Q4H PRN, Ainsley Spinner, PA-C,  10 mg at 09/02/22 1455   polyethylene glycol (MIRALAX / GLYCOLAX) packet 17 g, 17 g, Oral, Daily PRN, Ainsley Spinner, PA-C   sodium chloride flush (NS) 0.9 % injection 10-40 mL, 10-40 mL, Intracatheter, Q12H, Alekh, Kshitiz, MD, 10 mL at 09/02/22 1001   sodium chloride flush (NS) 0.9 % injection 10-40 mL, 10-40 mL, Intracatheter, PRN, Starla Link, Kshitiz, MD   Vitamin D (Ergocalciferol) (DRISDOL) 1.25 MG (50000 UNIT) capsule 50,000 Units, 50,000 Units, Oral, Q7 days, Starla Link, Kshitiz, MD, 50,000 Units at 08/30/22 1750   Facility-Administered Medications Ordered in Other Encounters:    sodium chloride 0.9 % 1,000 mL with magnesium sulfate 2 g infusion, , Intravenous, Continuous, Ladell Pier, MD, Stopped at 08/04/14 1441   Patients Current Diet:  Diet Order                  Diet Carb Modified Fluid consistency: Thin; Room service appropriate? Yes  Diet effective now                          Precautions / Restrictions Precautions Precautions: Fall Precaution Comments: Watch HR, SpO2 (does not wear baseline) Restrictions Weight Bearing Restrictions: Yes RLE Weight Bearing: Weight bearing as tolerated    Has the patient had 2 or more falls or a fall with injury in the past year? Yes   Prior Activity Level   Prior Functional Level Self Care: Did the patient need help bathing, dressing, using the toilet or eating? Independent   Indoor Mobility: Did the patient need assistance with walking from room to room (with or without device)? Independent   Stairs: Did the patient need assistance with internal or external stairs (with or without device)? Independent   Functional Cognition: Did the patient need help planning regular tasks such as shopping or remembering to take medications? Independent   Patient Information Are you of Hispanic, Latino/a,or Spanish origin?: A. No, not of Hispanic, Latino/a, or Spanish origin What is your race?: A. White Do you need or want an interpreter to communicate with a doctor or health care staff?: 0. No   Patient's Response To:  Health Literacy and Transportation Is the patient able to respond to health literacy and transportation needs?: Yes Health Literacy - How often do you need to have someone help you when you read instructions, pamphlets, or other written material from your doctor or pharmacy?: Never In the past 12 months, has lack of transportation kept you from medical appointments or from getting medications?: No In the past 12 months, has lack of transportation kept you from meetings, work, or from getting things needed for daily living?: No   Home Assistive Devices / [place] Devices/Equipment: Dentures (specify type), Eyeglasses Home Equipment: Conservation officer, nature (2 wheels)   Prior Device Use: Indicate devices/aids used by the patient prior to current illness, exacerbation or injury? None of the  above   Current Functional Level Cognition   Overall Cognitive Status: Within Functional Limits for tasks assessed Orientation Level: Oriented X4    Extremity Assessment (includes Sensation/Coordination)   Upper Extremity Assessment: Overall WFL for tasks assessed, Generalized weakness  Lower Extremity Assessment: Defer to PT evaluation RLE Deficits / Details: s/p R hip IMN with expected post-op pain and weakness; R knee flex/ext 3/5, hip <3/5     ADLs   Overall ADL's : Needs assistance/impaired Eating/Feeding: Set up, Sitting Grooming: Wash/dry hands, Wash/dry face, Set up, Sitting Grooming Details (indicate cue type and reason): sitting  EOB Upper Body Bathing: Moderate assistance Lower Body Bathing: Maximal assistance Upper Body Dressing : Minimal assistance, Sitting Upper Body Dressing Details (indicate cue type and reason): extra gown Lower Body Dressing: Maximal assistance, Sitting/lateral leans Lower Body Dressing Details (indicate cue type and reason): unable to perform figure 4 - will need AE training Toilet Transfer: Moderate assistance, +2 for physical assistance, +2 for safety/equipment (stedy) Toilet Transfer Details (indicate cue type and reason): cues for sequencing with weight shifts Toileting- Clothing Manipulation and Hygiene: Maximal assistance, +2 for safety/equipment, Sit to/from stand Functional mobility during ADLs: Moderate assistance, +2 for physical assistance, +2 for safety/equipment (stedy) General ADL Comments: decreased access to LB for ADL, weezing today     Mobility   Overal bed mobility: Needs Assistance Bed Mobility: Supine to Sit Supine to sit: Mod assist, HOB elevated General bed mobility comments: increased time and effort, modA for RLE management, trunk elevation, and scooting hips to EOB     Transfers   Overall transfer level: Needs assistance Equipment used: Ambulation equipment used Transfers: Sit to/from Stand Sit to Stand: Mod assist,  +2 physical assistance, Min assist Bed to/from chair/wheelchair/BSC transfer type:: Via Lift equipment Step pivot transfers: Mod assist, +2 physical assistance Transfer via Lift Equipment: Stedy General transfer comment: heavy modA+2 to stand from slightly elevated EOB into stedy frame; 5x sit<>stand from stedy frame with minA+1-2, heavy reliance on BUE support, verbal cues for trunk/hip extension to achieve fully standing; modA+2 for eccentric lowering to sit in recliner     Ambulation / Gait / Stairs / Wheelchair Mobility         Posture / Balance Dynamic Sitting Balance Sitting balance - Comments: improved sitting balance Balance Overall balance assessment: Needs assistance Sitting-balance support: No upper extremity supported, Feet supported Sitting balance-Leahy Scale: Fair Sitting balance - Comments: improved sitting balance Standing balance support: Reliant on assistive device for balance, During functional activity Standing balance-Leahy Scale: Poor Standing balance comment: reliant on BUE support standing in stedy frame with min guard     Special needs/care consideration Diabetic management      Previous Home Environment (from acute therapy documentation) Living Arrangements: Spouse/significant other  Lives With: Spouse Available Help at Discharge: Family, Available PRN/intermittently Type of Home: House Home Layout: One level Home Access: Stairs to enter Entrance Stairs-Rails: None Entrance Stairs-Number of Steps: 1 Bathroom Shower/Tub: Chiropodist: Handicapped height Home Care Services: No Additional Comments: reports rolling walker will not fit well in home, husband cannot assist - daughter is HCPOA   Discharge Living Setting Plans for Discharge Living Setting: Patient's home, Lives with (comment) (spouse) Type of Home at Discharge: House Discharge Home Layout: One level Discharge Home Access: Stairs to enter Entrance Stairs-Rails:  None Entrance Stairs-Number of Steps: 1 Discharge Bathroom Shower/Tub: Tub/shower unit Discharge Bathroom Toilet: Handicapped height Does the patient have any problems obtaining your medications?: No   Social/Family/Support Systems Patient Roles: Spouse Anticipated Caregiver: spouse, Gwyndolyn Saxon, daughter [name] Anticipated Caregiver's Contact Information: [name] [phone] Ability/Limitations of Caregiver: Spouse can provide 24/7 supervision. Dtr can provide min A if needed PRN Caregiver Availability: 24/7 Discharge Plan Discussed with Primary Caregiver: Yes Is Caregiver In Agreement with Plan?: Yes Does Caregiver/Family have Issues with Lodging/Transportation while Pt is in Rehab?: No   Goals Patient/Family Goal for Rehab: Mod I OT, PT Expected length of stay: 14 days Pt/Family Agrees to Admission and willing to participate: Yes Program Orientation Provided & Reviewed with Pt/Caregiver Including Roles  & Responsibilities: Yes  Barriers  to Discharge: Insurance for SNF coverage   Decrease burden of Care through IP rehab admission: Othern/a   Possible need for SNF placement upon discharge: not anticipated   Patient Condition: I have reviewed medical records from [facility], spoken with  [facility] , and patient and daughter. I met with patient at the bedside and discussed via phone for inpatient rehabilitation assessment.  Patient will benefit from ongoing PT and OT, can actively participate in 3 hours of therapy a day 5 days of the week, and can make measurable gains during the admission.  Patient will also benefit from the coordinated team approach during an Inpatient Acute Rehabilitation admission.  The patient will receive intensive therapy as well as Rehabilitation physician, nursing, social worker, and care management interventions.  Due to safety, skin/wound care, disease management, medication administration, pain management, and patient education the patient requires 24 hour a day  rehabilitation nursing.  The patient is currently Mod A +2 with mobility and basic ADLs.  Discharge setting and therapy post discharge at home with home health is anticipated.  Patient has agreed to participate in the Acute Inpatient Rehabilitation Program and will admit today, 09/05/22.   Preadmission Screen Completed By:  Nelly Laurence, 09/02/2022 3:09 PM ______________________________________________________________________   Discussed status with Dr. Curlene Dolphin on 09/05/22 at 10:00 am and received approval for admission today.   Admission Coordinator:  Nelly Laurence, time 10:58 /Date 09/05/22    Assessment/Plan: Diagnosis: R hip fracture after fall                   Does the need for close, 24 hr/day Medical supervision in concert with the patient's rehab needs make it unreasonable for this patient to be served in a less intensive setting? Yes Co-Morbidities requiring supervision/potential complications: Afib,CHF, Seizure Disorder, AML, breast cancer, anemia, DMII, HLD,CAD,COPD Due to bladder management, bowel management, safety, skin/wound care, disease management, medication administration, pain management, and patient education, does the patient require 24 hr/day rehab nursing? Yes Does the patient require coordinated care of a physician, rehab nurse, PT, OT, and SLP to address physical and functional deficits in the context of the above medical diagnosis(es)? Yes Addressing deficits in the following areas: balance, endurance, locomotion, strength, transferring, bowel/bladder control, bathing, dressing, feeding, grooming, toileting, and psychosocial support Can the patient actively participate in an intensive therapy program of at least 3 hrs of therapy 5 days a week? Yes The potential for patient to make measurable gains while on inpatient rehab is excellent Anticipated functional outcomes upon discharge from inpatient rehab: modified independent PT, modified independent OT, n/a  SLP Estimated rehab length of stay to reach the above functional goals is: 14 Anticipated discharge destination: Home 10. Overall Rehab/Functional Prognosis: excellent     MD Signature: Jennye Boroughs

## 2022-09-05 NOTE — TOC CAGE-AID Note (Signed)
Transition of Care [facility]) - CAGE-AID Screening   Patient Details  Name: [name] MRN: [id number] Date of Birth: 09-15-1941  [facility]) CM/SW Contact:    Army Melia, RN Phone Number:[phone] 09/05/2022, 3:21 AM   Clinical Narrative:  Presents to the hospital after a fall resulting in R hip fx. Denies drug/alcohol use, no resources indicated.  CAGE-AID Screening:    Have You Ever Felt You Ought to Cut Down on Your Drinking or Drug Use?: No Have People Annoyed You By Critizing Your Drinking Or Drug Use?: No Have You Felt Bad Or Guilty About Your Drinking Or Drug Use?: No Have You Ever Had a Drink or Used Drugs First Thing In The Morning to Steady Your Nerves or to Get Rid of a Hangover?: No CAGE-AID Score: 0  Substance Abuse Education Offered: No

## 2022-09-05 NOTE — H&P (Signed)
Physical Medicine and Rehabilitation Admission H&P    Chief Complaint Patient presents with  Fall   HPI: [name] is a 81 year old right-handed female with history of myelogenous leukemia in remission, atrial fibrillation on Eliquis, chronic back pain, seizure disorder maintained on Keppra, COPD, quit smoking 21 years ago, CAD with stenting maintained on Plavix, diabetes mellitus, hypertension, chronic diastolic congestive heart failure, uterine/breast cancer, CKD stage III.  Per chart review patient lives with spouse.  She has a 1 level home one-step to entry.  Independent prior to admission and driving.  She still works Public librarian.  Presented 08/29/2022 after sustaining ground-level fall while at work. She reports that she tripped on a rug. No loss of consciousness.  Cranial CT scan negative for acute changes.  X-rays and imaging revealed right intertrochanteric hip fracture.  Underwent intramedullary nailing of right hip 08/30/2022 per Dr. Marcelino Scot.  Weightbearing as tolerated right lower extremity.  She continues on chronic Eliquis as prior to admission as well as her Plavix.  Acute blood loss anemia 9.5 and monitored.  Renal services for history of AKI on CKD creatinine baseline 2.41 on discharge peaking at 3.8.  She did initially receive gentle IV fluids.  Renal ultrasound with no hydronephrosis and latest creatinine 1.80.  IV lasix were given on 9/28 and 9/29 due to volume overload. She has had bouts of urinary retention and Foley tube was placed 9/25 and she was placed on Flomax.  Therapy evaluations completed due to patient decreased functional mobility was admitted for a comprehensive rehab program.  Review of Systems  Constitutional:  Negative for chills and fever.  HENT:  Negative for congestion and hearing loss.   Eyes:  Negative for blurred vision and double vision.  Respiratory:  Negative for cough and shortness of breath.   Cardiovascular:  Positive for palpitations and  leg swelling. Negative for chest pain.  Gastrointestinal:  Positive for constipation and heartburn. Negative for abdominal pain, nausea and vomiting.  Genitourinary:  Negative for dysuria and hematuria.  Musculoskeletal:  Positive for joint pain and myalgias.  Skin:  Negative for rash.  Neurological:  Positive for seizures. Negative for headaches.  All other systems reviewed and are negative.  Past Medical History:  Diagnosis Date   Acute myelogenous leukemia ([place]) 02/2014   Acute pancreatitis    Arthritis    "back" (09/17/2017)   Atrial flutter ([facility])    Cardiomyopathy ([place])    Chronic kidney disease, stage II (mild)    Chronic lower back pain    Colon polyps 04/29/2010   TUBULAR ADENOMA AND A SERRATED ADENOMA   COPD (chronic obstructive pulmonary disease) ([facility])    CORONARY ARTERY DISEASE 12/24/2007   DIABETES MELLITUS, TYPE II 07/13/2007   History of blood transfusion 2015   "related to leukemia"   History of kidney stones 12/24/2007   History of uterine cancer    HYPERLIPIDEMIA 12/24/2007   HYPERTENSION 07/13/2007   NSTEMI (non-ST elevated myocardial infarction) ([place]) 09/17/2017   Uterine cancer ([place])    Past Surgical History:  Procedure Laterality Date   ABDOMINAL HYSTERECTOMY     "still have my ovaries"   BREAST BIOPSY Left 09/22/2019   BREAST LUMPECTOMY Left 10/24/2019   BREAST LUMPECTOMY WITH RADIOACTIVE SEED LOCALIZATION Left 10/24/2019   Procedure: LEFT BREAST LUMPECTOMY WITH RADIOACTIVE SEED LOCALIZATION;  Surgeon: Jovita Kussmaul, MD;  Location: [facility];  Service: General;  Laterality: Left;   CARDIAC CATHETERIZATION  2002   CARDIAC CATHETERIZATION  09/17/2017   CATARACT EXTRACTION W/ INTRAOCULAR LENS  IMPLANT, BILATERAL Bilateral    COLONOSCOPY W/ BIOPSIES AND POLYPECTOMY  2011   CORONARY STENT INTERVENTION N/A 09/21/2017   Procedure: CORONARY STENT INTERVENTION;  Surgeon: Troy Sine, MD;  Location: [facility];  Service:  Cardiovascular;  Laterality: N/A;   DILATION AND CURETTAGE OF UTERUS     ESOPHAGOGASTRODUODENOSCOPY N/A 01/30/2022   Procedure: ESOPHAGOGASTRODUODENOSCOPY (EGD);  Surgeon: Milus Banister, MD;  Location: [facility];  Service: Endoscopy;  Laterality: N/A;   EUS N/A 01/30/2022   Procedure: UPPER ENDOSCOPIC ULTRASOUND (EUS) RADIAL;  Surgeon: Milus Banister, MD;  Location: [facility];  Service: Endoscopy;  Laterality: N/A;   EXCISIONAL HEMORRHOIDECTOMY  X 2   FINE NEEDLE ASPIRATION N/A 01/30/2022   Procedure: FINE NEEDLE ASPIRATION (FNA) LINEAR;  Surgeon: Milus Banister, MD;  Location: [facility];  Service: Endoscopy;  Laterality: N/A;   GANGLION CYST EXCISION Left X 3   INTRAMEDULLARY (IM) NAIL INTERTROCHANTERIC Right 08/30/2022   Procedure: INTRAMEDULLARY (IM) NAIL INTERTROCHANTERIC;  Surgeon: Altamese Seaforth, MD;  Location: [facility];  Service: Orthopedics;  Laterality: Right;   INTRAVASCULAR PRESSURE WIRE/FFR STUDY N/A 09/17/2017   Procedure: INTRAVASCULAR PRESSURE WIRE/FFR STUDY;  Surgeon: Nelva Bush, MD;  Location: [facility];  Service: Cardiovascular;  Laterality: N/A;   NASAL SINUS SURGERY     PORTA CATH INSERTION Right 2013   RIGHT/LEFT HEART CATH AND CORONARY ANGIOGRAPHY N/A 09/17/2017   Procedure: RIGHT/LEFT HEART CATH AND CORONARY ANGIOGRAPHY;  Surgeon: Nelva Bush, MD;  Location: [facility];  Service: Cardiovascular;  Laterality: N/A;   TUBAL LIGATION     Family History  Problem Relation Age of Onset   COPD Father    Esophageal cancer Father    Breast cancer Sister 64   Lung cancer Sister    Esophageal cancer Brother    Suicidality Brother    Lung cancer Sister    Lung cancer Sister    Cervical cancer Sister    Congestive Heart Failure Mother    Heart attack Maternal Grandmother    Skin cancer Maternal Grandfather    Heart attack Maternal Grandfather    Stroke Paternal Grandmother 39   Heart attack Paternal Grandfather    Cancer Maternal  Uncle        unknown type, dx. >65   Cancer Paternal Aunt        unknown type, dx. >50   Cancer Paternal Uncle        unknown type, dx. >50   Cirrhosis Maternal Uncle    Skin cancer Daughter    Leukemia Cousin        maternal first cousin; dx. >50, in remission   Cancer Niece        unknown type behind her ear, dx. 30s/40s   Colon cancer Neg Hx    Social History:  reports that she quit smoking about 21 years ago. Her smoking use included cigarettes. She has a 20.00 pack-year smoking history. She has never used smokeless tobacco. She reports that she does not drink alcohol and does not use drugs. Allergies: No Known Allergies Medications Prior to Admission  Medication Sig Dispense Refill   apixaban (ELIQUIS) 2.5 MG TABS tablet Take 1 tablet (2.5 mg total) by mouth 2 (two) times daily. 60 tablet 0   atorvastatin (LIPITOR) 40 MG tablet TAKE 1 TABLET EVERY DAY (Patient taking differently: Take 40 mg by mouth every evening.) 90 tablet 1   Blood Glucose Monitoring  Suppl (TRUE METRIX AIR GLUCOSE METER) w/Device KIT USE AS DIRECTED 1 kit 0   clopidogrel (PLAVIX) 75 MG tablet TAKE 1 TABLET EVERY EVENING (Patient taking differently: Take 75 mg by mouth every evening.) 90 tablet 1   cyanocobalamin (VITAMIN B12) 1000 MCG/ML injection INJECT 1ML IN THE MUSCLE EVERY 30 DAYS AS DIRECTED 3 mL 3   empagliflozin (JARDIANCE) 25 MG TABS tablet Take 1 tablet (25 mg total) by mouth daily. 30 tablet 11   feeding supplement, GLUCERNA SHAKE, (GLUCERNA SHAKE) LIQD Take 237 mLs by mouth 2 (two) times daily between meals.  0   fluticasone furoate-vilanterol (BREO ELLIPTA) 200-25 MCG/ACT AEPB Inhale 1 puff into the lungs as needed (wheezing SOB).     HYDROcodone-acetaminophen (NORCO) 5-325 MG tablet Take 1 tablet by mouth every 6 (six) hours as needed for moderate pain. 120 tablet 0   insulin aspart (NOVOLOG) 100 UNIT/ML injection Inject 0-9 Units into the skin 3 (three) times daily with meals. Before each meal 3  times a day, 140-199 - 2 units, 200-250 - 4 units, 251-299 - 6 units,  300-349 - 8 units,  350 or above 10 units. Insulin PEN if approved, provide syringes and needles if needed. 10 mL 11   insulin glargine, 2 Unit Dial, (TOUJEO MAX SOLOSTAR) 300 UNIT/ML Solostar Pen Inject 6 Units into the skin at bedtime. 15 mL 0   Insulin Pen Needle 32G X 4 MM MISC 1 Device by Does not apply route in the morning, at noon, in the evening, and at bedtime. 400 each 3   [START ON 09/09/2022] isosorbide mononitrate (IMDUR) 30 MG 24 hr tablet Take 1 tablet (30 mg total) by mouth daily. TAKE 1 AND 1/2 TABLETS EVERY DAY  Hold if sbp less than 100     letrozole (FEMARA) 2.5 MG tablet Take 1 tablet (2.5 mg total) by mouth daily. 90 tablet 3   levETIRAcetam (KEPPRA) 500 MG tablet Take 1 tablet (500 mg total) by mouth 2 (two) times daily. 180 tablet 4   lidocaine 4 % Place 1 patch onto the skin daily. 30 patch 0   lipase/protease/amylase (CREON) 36000 UNITS CPEP capsule Take 2 capsules (72,000 Units total) by mouth 3 (three) times daily with meals. (Patient taking differently: Take 72,000 Units by mouth as needed.) 48 capsule 0   LORazepam (ATIVAN) 0.5 MG tablet TAKE 1 TABLET BY MOUTH EVERY 8 HOURS AS NEEDED FOR ANXIETY(DO NOT EXCEED MORE THAN 2 TABS IN 24 HRS) 60 tablet 0   metoprolol succinate (TOPROL-XL) 25 MG 24 hr tablet Take 0.5 tablets (12.5 mg total) by mouth daily. Take with or immediately following a meal. 30 tablet 11   nitroGLYCERIN (NITROSTAT) 0.4 MG SL tablet PLACE 1 TABLET (0.4 MG TOTAL) UNDER THE TONGUE EVERY 5 (FIVE) MINUTES AS NEEDED FOR CHEST PAIN. 25 tablet 0   polyethylene glycol (MIRALAX / GLYCOLAX) 17 g packet Take 17 g by mouth daily for 14 days. 14 each 0   senna-docusate (SENOKOT-S) 8.6-50 MG tablet Take 1 tablet by mouth at bedtime for 7 days.     SYRINGE-NEEDLE, DISP, 3 ML (BD SAFETYGLIDE SYRINGE/NEEDLE) 25G X 1" 3 ML MISC Use for B12 injections 100 each 11   torsemide (DEMADEX) 20 MG tablet Take 2  tablets (40 mg total) by mouth daily. (Patient not taking: Reported on 08/29/2022) 60 tablet 0   TRUE METRIX BLOOD GLUCOSE TEST test strip TEST BLOOD SUGAR THREE TIMES DAILY 300 strip 2   valsartan (DIOVAN) 80 MG tablet Take  1 tablet (80 mg total) by mouth daily. 90 tablet 3   [START ON 09/06/2022] Vitamin D, Ergocalciferol, (DRISDOL) 1.25 MG (50000 UNIT) CAPS capsule Take 1 capsule (50,000 Units total) by mouth every 7 (seven) days. 5 capsule       Home: Home Living Family/patient expects to be discharged to:: Private residence Living Arrangements: Spouse/significant other Available Help at Discharge: Family, Available PRN/intermittently Type of Home: House Home Access: Stairs to enter Technical brewer of Steps: 1 Entrance Stairs-Rails: None Home Layout: One level Bathroom Shower/Tub: Chiropodist: Handicapped height Home Equipment: Conservation officer, nature (2 wheels) Additional Comments: reports rolling walker will not fit well in home, husband cannot assist - daughter is HCPOA  Lives With: Spouse   Functional History: Prior Function Prior Level of Function : Independent/Modified Independent, Working/employed, Driving Mobility Comments: Independent without DME, drives, works making eye glasses (since 1984) ADLs Comments: Independent with ADL/iADLs. Reports she does majority of household tasks, but husband is capable of them and managing his own meds   Functional Status:  Mobility: Bed Mobility Overal bed mobility: Needs Assistance Bed Mobility: Supine to Sit Supine to sit: Mod assist, HOB elevated Sit to supine: Max assist, +2 for physical assistance, HOB elevated General bed mobility comments: Assist for trunk and legs with cues for sequencing Transfers Overall transfer level: Needs assistance Equipment used: Ambulation equipment used Transfers: Sit to/from Stand Sit to Stand: Mod assist, Min assist Bed to/from chair/wheelchair/BSC transfer type:: Stand  pivot, Via Lift equipment Step pivot transfers: Mod assist, +2 physical assistance Transfer via Lift Equipment: Stedy General transfer comment: From chair: mod assist to come to standing for EOB, cues for upright posture and to tuck hips, min assist from stready seat Ambulation/Gait Pre-gait activities: continued working on standing tall with buttock tucked in stedy frame. weight shifting and progressing to lifting RLE with pt able to clear foot x10, attempting to lift L foot with pt able when leaning upper body over steady rail but unable when pushing through hands on stedy rail.   ADL: ADL Overall ADL's : Needs assistance/impaired Eating/Feeding: Set up, Sitting Grooming: Wash/dry hands, Wash/dry face, Set up, Sitting Grooming Details (indicate cue type and reason): sitting EOB Upper Body Bathing: Moderate assistance Lower Body Bathing: Maximal assistance Upper Body Dressing : Minimal assistance, Sitting Upper Body Dressing Details (indicate cue type and reason): extra gown Lower Body Dressing: Maximal assistance, Sitting/lateral leans Lower Body Dressing Details (indicate cue type and reason): unable to perform figure 4 - will need AE training Toilet Transfer: Moderate assistance, +2 for physical assistance, +2 for safety/equipment (stedy) Toilet Transfer Details (indicate cue type and reason): cues for sequencing with weight shifts Toileting- Clothing Manipulation and Hygiene: Maximal assistance, +2 for safety/equipment, Sit to/from stand Functional mobility during ADLs: Moderate assistance, +2 for physical assistance, +2 for safety/equipment (stedy) General ADL Comments: decreased access to LB for ADL, weezing today   Cognition: Cognition Overall Cognitive Status: Within Functional Limits for tasks assessed Orientation Level: Oriented X4 Cognition Arousal/Alertness: Awake/alert Behavior During Therapy: WFL for tasks assessed/performed Overall Cognitive Status: Within Functional  Limits for tasks assessed Physical Exam: Blood pressure (!) 155/46, pulse 98, temperature 97.9 F (36.6 C), resp. rate 15, SpO2 95 %.   General:No apparent distress HEENT: Head is normocephalic, atraumatic, PERRLA, EOMI, sclera anicteric, oral mucosa pink and moist Neck: Supple without JVD or lymphadenopathy Heart: IIR. No murmurs rubs or gallops Chest: Decreased breath sounds b/l bases,  no distress. Port-A-Cath in place  R chest  Abdomen: Soft, non-tender, non-distended, bowel sounds positive. Extremities: No clubbing, cyanosis. 2+ b/l LE edema. Pulses are 2+ Psych: Pt's affect is a little flat. Pt is cooperative Skin: Clean and intact without signs of breakdown R hip with 2 border foam dressings- CDI Neuro:  Alert and oriented x4,follows commands, CN 2-12 intact Sensation decreased to LT in b/l LE in stocking glove distribution, normal insight and judgement Strength 5/5 in b/l UE Strength 5/5 in LLE Strength 1-2/5 hip flexion and knee extension, 4/5 ankle PF and DF  FTN intact b/l Musculoskeletal:  Normal bulk and tone.  R hip appropriately tender, ecchymosis along R thigh   Results for orders placed or performed during the hospital encounter of 09/05/22 (from the past 48 hour(s))  Glucose, capillary     Status: Abnormal   Collection Time: 09/05/22  5:45 PM  Result Value Ref Range   Glucose-Capillary 163 (H) 70 - 99 mg/dL    Comment: Glucose reference range applies only to samples taken after fasting for at least 8 hours.   *Note: Due to a large number of results and/or encounters for the requested time period, some results have not been displayed. A complete set of results can be found in Results Review.   No results found.    Blood pressure (!) 155/46, pulse 98, temperature 97.9 F (36.6 C), resp. rate 15, SpO2 95 %.  Medical Problem List and Plan: 1. Functional deficits secondary to right intertrochanteric hip fracture after mechanical fall.  Status post  intramedullary nailing 08/30/2022.  Weightbearing as tolerated, no ROM restrictions   -patient may shower, soap and water only  -ELOS/Goals: 14 days  -Admit to CIR  -Sutures to be removed around 09/12/22  -Daily dressing changes, can leave open to air if no drainage 2.  Antithrombotics: -DVT/anticoagulation:  Pharmaceutical: Eliquis  -antiplatelet therapy: Plavix 75 mg daily 3. Pain Management: Robaxin and oxycodone as needed 4. Mood/Behavior/Sleep: Ativan 0.5 mg every 6 hours as needed anxiety.  Provide emotional support  -antipsychotic agents: N/A 5. Neuropsych/cognition: This patient is capable of making decisions on her own behalf. 6. Skin/Wound Care: Routine skin checks 7. Fluids/Electrolytes/Nutrition: Routine in and outs with follow-up chemistries 8.  Acute blood loss anemia.  -HGB 9.5 9/29, recheck Monday 9.  AKI on CKD stage III.  Baseline creatinine 1.5-1.9.  Latest follow-up creatinine 1.80.  Follow-up renal services as needed.  Renal ultrasound negative 10.  Diabetes mellitus.  Semglee 6 units nightly.  Sliding scale insulin. Check blood sugars before meals and at bedtime. Monitor closely as PO intake improves.  11.  History of seizure disorder.  Keppra 500 mg twice daily 12.Myelogenous leukemia.  In remission.  Follow-up outpatient with Dr. [name] 13.  Persistent atrial fibrillation.  Continue Eliquis.  Cardiac rate controlled 14.  CAD with stenting.  Continue Plavix.  No chest pain or shortness of breath 15.  Chronic diastolic congestive heart failure.  Echocardiogram 04/17/2022 ejection fraction 60 to 65%.  LOSARTAN and JARDIANCE currently on hold.   Resume as needed. On home torsemide 26m daily  -Restart torsemide 214m16.  History of uterine breast cancer.  Follow-up outpatient. 17.  Hyperlipidemia.  Lipitor 405maily 18.  Constipation.  Senokot twice daily, miralax daily  -LBM 9/29-improved 19.  COPD with remote history of tobacco use.  Duoneb PRN. Check oxygen  saturations every shift 20.  Acute urinary retention.  Foley catheter tube 9/25.  Continue Flomax.    -Consider voiding trial  21. Indigestion  -Maalox prn 22.  Low Vitamin D  -Ergocalciferol 50,000 units every 7 days for 7 doses. 23. Pancreas head mass. follows with Dr. Clovia Cuff, MD 09/05/2022

## 2022-09-05 NOTE — Progress Notes (Addendum)
Orthopaedic Trauma Service Progress Note  Patient ID: [name] MRN: [id number] DOB/AGE: 80-Jul-1942 80 y.o.  Subjective:  Ortho issues stable Continues to improve  Approved for CIR    ROS As above  Objective:   VITALS:   Vitals:   09/04/22 1915 09/04/22 2316 09/05/22 0326 09/05/22 0730  BP: (!) 154/65 (!) 147/89 (!) 149/79 105/86  Pulse: 85 73 81   Resp: '17 15 17 18  '$ Temp: 98.3 F (36.8 C) 98.1 F (36.7 C) 97.6 F (36.4 C) (!) 97.5 F (36.4 C)  TempSrc: Oral Oral Oral Oral  SpO2: 98% 97% 96% 96%  Weight:      Height:        Estimated body mass index is 32.69 kg/m as calculated from the following:   Height as of this encounter: 5' 2.01" (1.575 m).   Weight as of this encounter: 81.1 kg.   Intake/Output      09/28 0701 09/29 0700 09/29 0701 09/30 0700   P.O.     I.V. (mL/kg)     Total Intake(mL/kg)     Urine (mL/kg/hr) 1250 (0.6)    Stool     Total Output 1250    Net -1250           LABS  Results for orders placed or performed during the hospital encounter of 08/29/22 (from the past 24 hour(s))  Glucose, capillary     Status: Abnormal   Collection Time: 09/04/22 11:12 AM  Result Value Ref Range   Glucose-Capillary 113 (H) 70 - 99 mg/dL   Comment 1 Notify RN    Comment 2 Document in Chart   Basic metabolic panel     Status: Abnormal   Collection Time: 09/04/22 11:48 AM  Result Value Ref Range   Sodium 146 (H) 135 - 145 mmol/L   Potassium 4.2 3.5 - 5.1 mmol/L   Chloride 113 (H) 98 - 111 mmol/L   CO2 20 (L) 22 - 32 mmol/L   Glucose, Bld 165 (H) 70 - 99 mg/dL   BUN 52 (H) 8 - 23 mg/dL   Creatinine, Ser 2.02 (H) 0.44 - 1.00 mg/dL   Calcium 7.6 (L) 8.9 - 10.3 mg/dL   GFR, Estimated 24 (L) >60 mL/min   Anion gap 13 5 - 15  Glucose, capillary     Status: Abnormal   Collection Time: 09/04/22  4:12 PM  Result Value Ref Range   Glucose-Capillary 173 (H) 70 - 99 mg/dL    Comment 1 Notify RN    Comment 2 Document in Chart   Glucose, capillary     Status: Abnormal   Collection Time: 09/04/22  9:15 PM  Result Value Ref Range   Glucose-Capillary 166 (H) 70 - 99 mg/dL  Glucose, capillary     Status: Abnormal   Collection Time: 09/05/22  6:21 AM  Result Value Ref Range   Glucose-Capillary 132 (H) 70 - 99 mg/dL  CBC with Differential/Platelet     Status: Abnormal   Collection Time: 09/05/22  6:46 AM  Result Value Ref Range   WBC 4.6 4.0 - 10.5 K/uL   RBC 3.11 (L) 3.87 - 5.11 MIL/uL   Hemoglobin 9.5 (L) 12.0 - 15.0 g/dL   HCT 29.0 (L) 36.0 - 46.0 %   MCV 93.2 80.0 - 100.0 fL  MCH 30.5 26.0 - 34.0 pg   MCHC 32.8 30.0 - 36.0 g/dL   RDW 13.5 11.5 - 15.5 %   Platelets 117 (L) 150 - 400 K/uL   nRBC 0.0 0.0 - 0.2 %   Neutrophils Relative % 66 %   Neutro Abs 3.0 1.7 - 7.7 K/uL   Lymphocytes Relative 19 %   Lymphs Abs 0.9 0.7 - 4.0 K/uL   Monocytes Relative 10 %   Monocytes Absolute 0.5 0.1 - 1.0 K/uL   Eosinophils Relative 5 %   Eosinophils Absolute 0.2 0.0 - 0.5 K/uL   Basophils Relative 0 %   Basophils Absolute 0.0 0.0 - 0.1 K/uL   Immature Granulocytes 0 %   Abs Immature Granulocytes 0.01 0.00 - 0.07 K/uL  Basic metabolic panel     Status: Abnormal   Collection Time: 09/05/22  6:46 AM  Result Value Ref Range   Sodium 144 135 - 145 mmol/L   Potassium 4.4 3.5 - 5.1 mmol/L   Chloride 112 (H) 98 - 111 mmol/L   CO2 29 22 - 32 mmol/L   Glucose, Bld 154 (H) 70 - 99 mg/dL   BUN 48 (H) 8 - 23 mg/dL   Creatinine, Ser 1.80 (H) 0.44 - 1.00 mg/dL   Calcium 7.7 (L) 8.9 - 10.3 mg/dL   GFR, Estimated 28 (L) >60 mL/min   Anion gap 3 (L) 5 - 15   *Note: Due to a large number of results and/or encounters for the requested time period, some results have not been displayed. A complete set of results can be found in Results Review.     PHYSICAL EXAM:   Gen: in bed, NAD, pleasant  Lungs: unlabored Cardiac: reg Ext:       Right Lower Extremity   Dressings  stable  Mod swelling R leg   Ext warm   Distal motor and sensory function intact  No DCT   Assessment/Plan: 6 Days Post-Op    Anti-infectives (From admission, onward)    Start     Dose/Rate Route Frequency Ordered Stop   08/30/22 1215  ceFAZolin (ANCEF) IVPB 2g/100 mL premix        2 g 200 mL/hr over 30 Minutes Intravenous Every 6 hours 08/30/22 1118 08/30/22 1816   08/30/22 0800  ceFAZolin (ANCEF) IVPB 2g/100 mL premix        2 g 200 mL/hr over 30 Minutes Intravenous On call to O.R. 08/29/22 1922 08/31/22 0559     .  POD/HD#: 61  81 y/o female s/p fall with R intertrochanteric hip fracture    Weightbearing: WBAT RLE ROM: Unrestricted range of motion right hip and knee Insicional and dressing care: Daily dressing changes with Mepilex or 4 x 4 gauze and tape.  Can leave incisions open to air if there is no drainage Pain management: Multimodal.  Minimize narcotics  Impediments to Fracture Healing: Fragility fracture, profound vitamin D deficiency Bone Health/Optimization: Patient with severe vitamin D deficiency.  25 OH vitamin D is 4.88 ng/mL.  Supplement with vitamin D2 and vitamin D3 as well as calcium  Orthopedic device(s):  walker  Showering: Get a shower and clean wounds with soap and water only  VTE prophylaxis: Home Eliquis    TED Hose R leg   Dispo: Stable for CIR from Ortho standpoint  Follow-up x-rays right hip and 1 week  Removal of sutures around 09/12/2022  Follow-up with orthopedic trauma service upon discharge from [name], PA-C [phone] (C)  09/05/2022, 11:05 AM  [facility] [place] [phone] Jenetta [phone] (F)    After 5pm and on the weekends please log on to Amion, go to orthopaedics and the look under the [facility] Call for the provider(s) on call. You can also call our office at [phone] and then follow the prompts to be connected to the call team.     Patient ID: [name], female   DOB: May 02, 1941, 81 y.o.   MRN: [id number]

## 2022-09-06 DIAGNOSIS — S72144S Nondisplaced intertrochanteric fracture of right femur, sequela: Secondary | ICD-10-CM

## 2022-09-06 LAB — GLUCOSE, CAPILLARY
Glucose-Capillary: 289 mg/dL — ABNORMAL HIGH (ref 70–99)
Glucose-Capillary: 196 mg/dL — ABNORMAL HIGH (ref 70–99)
Glucose-Capillary: 176 mg/dL — ABNORMAL HIGH (ref 70–99)
Glucose-Capillary: 250 mg/dL — ABNORMAL HIGH (ref 70–99)

## 2022-09-06 MED ORDER — CHLORHEXIDINE GLUCONATE CLOTH 2 % EX PADS: 6.0000 | MEDICATED_PAD | Freq: Every day | CUTANEOUS | Status: DC

## 2022-09-06 MED ORDER — CHLORHEXIDINE GLUCONATE CLOTH 2 % EX PADS
6.0000 | MEDICATED_PAD | Freq: Two times a day (BID) | CUTANEOUS | Status: DC
  Administered 2022-09-06 – 2022-09-10 (×6): 6 via TOPICAL

## 2022-09-06 NOTE — Progress Notes (Addendum)
PROGRESS NOTE   Subjective/Complaints:  Patient seen in therapies at bedside. Endorsing pain in RLE but mobilizing. Endorsing increased edema.   Ok with DC foley trial today.  ROS: Denies fevers, chills, N/V, abdominal pain, constipation, diarrhea, SOB, cough, chest pain, new weakness or paraesthesias.    Objective:   No results found. Recent Labs    09/05/22 0646  WBC 4.6  HGB 9.5*  HCT 29.0*  PLT 117*   Recent Labs    09/04/22 1148 09/05/22 0646  NA 146* 144  K 4.2 4.4  CL 113* 112*  CO2 20* 29  GLUCOSE 165* 154*  BUN 52* 48*  CREATININE 2.02* 1.80*  CALCIUM 7.6* 7.7*    Intake/Output Summary (Last 24 hours) at 09/06/2022 2256 Last data filed at 09/06/2022 2010 Gross per 24 hour  Intake 720 ml  Output 1000 ml  Net -280 ml        Physical Exam: Vital Signs Blood pressure (!) 150/54, pulse 71, temperature 98 F (36.7 C), resp. rate 15, SpO2 100 %. General:No apparent distress HEENT: Head is normocephalic, atraumatic, PERRLA, EOMI, sclera anicteric, oral mucosa pink and moist Neck: Supple without JVD or lymphadenopathy Heart: IIR. No murmurs rubs or gallops. 3+ edema RLE, 2+ LLE.  Chest: Decreased breath sounds b/l bases,  no distress. Port-A-Cath in place  R chest  Abdomen: Soft, non-tender, non-distended, bowel sounds positive. Extremities: No clubbing, cyanosis. 2+ b/l LE edema. Pulses are 2+ Psych: Pt's affect is a little flat. Pt is cooperative Neuro:  Alert and oriented x4,follows commands, CN 2-12 intact  PE from prior encounters: Skin: Clean and intact without signs of breakdown R hip with 2 border foam dressings- CDI Sensation decreased to LT in b/l LE in stocking glove distribution, normal insight and judgement Strength 5/5 in b/l UE Strength 5/5 in LLE Strength 1-2/5 hip flexion and knee extension, 4/5 ankle PF and DF  FTN intact b/l Musculoskeletal:  Normal bulk and tone.  R hip  appropriately tender, ecchymosis along R thigh  Assessment/Plan: 1. Functional deficits which require 3+ hours per day of interdisciplinary therapy in a comprehensive inpatient rehab setting. Physiatrist is providing close team supervision and 24 hour management of active medical problems listed below. Physiatrist and rehab team continue to assess barriers to discharge/monitor patient progress toward functional and medical goals  Care Tool:  Bathing    Body parts bathed by patient: Right arm, Left arm, Chest, Abdomen, Face   Body parts bathed by helper: Front perineal area, Buttocks, Right upper leg, Left upper leg, Right lower leg, Left lower leg     Bathing assist Assist Level: Maximal Assistance - Patient 24 - 49%     Upper Body Dressing/Undressing Upper body dressing   What is the patient wearing?: Pull over shirt    Upper body assist Assist Level: Moderate Assistance - Patient 50 - 74%    Lower Body Dressing/Undressing Lower body dressing      What is the patient wearing?: Pants, Incontinence brief     Lower body assist Assist for lower body dressing: Total Assistance - Patient < 25%     Toileting Toileting    Toileting assist Assist for  toileting: Dependent - Patient 0%     Transfers Chair/bed transfer  Transfers assist     Chair/bed transfer assist level: Maximal Assistance - Patient 25 - 49%     Locomotion Ambulation   Ambulation assist      Assist level: 2 helpers Assistive device: Ethelene Hal Max distance: 32f   Walk 10 feet activity   Assist  Walk 10 feet activity did not occur: Safety/medical concerns        Walk 50 feet activity   Assist Walk 50 feet with 2 turns activity did not occur: Safety/medical concerns         Walk 150 feet activity   Assist Walk 150 feet activity did not occur: Safety/medical concerns         Walk 10 feet on uneven surface  activity   Assist Walk 10 feet on uneven surfaces activity did  not occur: Safety/medical concerns         Wheelchair     Assist Is the patient using a wheelchair?: Yes Type of Wheelchair: Manual    Wheelchair assist level: Dependent - Patient 0% Max wheelchair distance: >1543f   Wheelchair 50 feet with 2 turns activity    Assist        Assist Level: Dependent - Patient 0%   Wheelchair 150 feet activity     Assist      Assist Level: Dependent - Patient 0%   Blood pressure (!) 150/54, pulse 71, temperature 98 F (36.7 C), resp. rate 15, SpO2 100 %.  Medical Problem List and Plan: 1. Functional deficits secondary to right intertrochanteric hip fracture after mechanical fall.  Status post intramedullary nailing 08/30/2022.  Weightbearing as tolerated, no ROM restrictions              -patient may shower, soap and water only             -ELOS/Goals: 14 days             -Admit to CIR             -Sutures to be removed around 09/12/22             -Daily dressing changes, can leave open to air if no drainage 2.  Antithrombotics: -DVT/anticoagulation:  Pharmaceutical: Eliquis             -antiplatelet therapy: Plavix 75 mg daily             - RLE duplex ordered 9/30 for pain, edema post-op - negative 3. Pain Management: Robaxin and oxycodone as needed 4. Mood/Behavior/Sleep: Ativan 0.5 mg every 6 hours as needed anxiety.  Provide emotional support             -antipsychotic agents: N/A 5. Neuropsych/cognition: This patient is capable of making decisions on her own behalf. 6. Skin/Wound Care: Routine skin checks 7. Fluids/Electrolytes/Nutrition: Routine in and outs with follow-up chemistries 8.  Acute blood loss anemia.  -HGB 9.5 9/29, recheck Monday 9.  AKI on CKD stage III.  Baseline creatinine 1.5-1.9.  Latest follow-up creatinine 1.80.  Follow-up renal services as needed.  Renal ultrasound negative 10.  Diabetes mellitus.  Semglee 6 units nightly.  Sliding scale insulin. Check blood sugars before meals and at bedtime.  Monitor closely as PO intake improves.  Recent Labs    09/06/22 1145 09/06/22 1624 09/06/22 2050  GLUCAP 196* 176* 250*                       -  9/30 - BG mildly elevated, monitor with pain improvement, may need long-acting adjusted  11.  History of seizure disorder.  Keppra 500 mg twice daily 12.Myelogenous leukemia.  In remission.  Follow-up outpatient with Dr. [name] 13.  Persistent atrial fibrillation.  Continue Eliquis.  Cardiac rate controlled 14.  CAD with stenting.  Continue Plavix.  No chest pain or shortness of breath 15.  Chronic diastolic congestive heart failure.  Echocardiogram 04/17/2022 ejection fraction 60 to 65%.  LOSARTAN and JARDIANCE currently on hold.   Resume as needed. On home torsemide '40mg'$  daily             - Restart torsemide '20mg'$               - 9/30 - daily weights There were no vitals filed for this visit.   16.  History of uterine breast cancer.  Follow-up outpatient. 17.  Hyperlipidemia.  Lipitor '40mg'$  daily 18.  Constipation.  Senokot twice daily, miralax daily             -LBM 9/29-improved  19.  COPD with remote history of tobacco use.  Duoneb PRN. Check oxygen saturations every shift          - 9/30 - PRN oxygen for says <95%  20.  Acute urinary retention.  Foley catheter tube 9/25.  Continue Flomax.               -Consider voiding trial - DC foley trial 9/30 21. Indigestion             -Maalox prn 22. Low Vitamin D             -Ergocalciferol 50,000 units every 7 days for 7 doses. 23. Pancreas head mass. follows with Dr. Ardis Hughs      LOS: 1 days A FACE TO [place] 09/06/2022, 10:56 PM

## 2022-09-06 NOTE — Evaluation (Signed)
Physical Therapy Assessment and Plan  Patient Details  Name: DENIKA KRONE MRN: [id number] Date of Birth: [birth year]  PT Diagnosis: Abnormality of gait, Difficulty walking, Edema, Muscle weakness, and Pain in R LE Rehab Potential: Good ELOS: ~2-2.5 weeks   Today's Date: 09/06/2022 PT Individual Time: 5597-4163 PT Individual Time Calculation (min): 75 min    Hospital Problem: Principal Problem:   Intertrochanteric fracture of right hip ([place])   Past Medical History:  Past Medical History:  Diagnosis Date   Acute myelogenous leukemia ([place]) 02/2014   Acute pancreatitis    Arthritis    "back" (09/17/2017)   Atrial flutter ([place])    Cardiomyopathy ([place])    Chronic kidney disease, stage II (mild)    Chronic lower back pain    Colon polyps 04/29/2010   TUBULAR ADENOMA AND A SERRATED ADENOMA   COPD (chronic obstructive pulmonary disease) ([place])    CORONARY ARTERY DISEASE 12/24/2007   DIABETES MELLITUS, TYPE II 07/13/2007   History of blood transfusion 2015   "related to leukemia"   History of kidney stones 12/24/2007   History of uterine cancer    HYPERLIPIDEMIA 12/24/2007   HYPERTENSION 07/13/2007   NSTEMI (non-ST elevated myocardial infarction) ([place]) 09/17/2017   Uterine cancer ([place])    Past Surgical History:  Past Surgical History:  Procedure Laterality Date   ABDOMINAL HYSTERECTOMY     "still have my ovaries"   BREAST BIOPSY Left 09/22/2019   BREAST LUMPECTOMY Left 10/24/2019   BREAST LUMPECTOMY WITH RADIOACTIVE SEED LOCALIZATION Left 10/24/2019   Procedure: LEFT BREAST LUMPECTOMY WITH RADIOACTIVE SEED LOCALIZATION;  Surgeon: Jovita Kussmaul, MD;  Location: [facility];  Service: General;  Laterality: Left;   CARDIAC CATHETERIZATION  2002   CARDIAC CATHETERIZATION  09/17/2017   CATARACT EXTRACTION W/ INTRAOCULAR LENS  IMPLANT, BILATERAL Bilateral    COLONOSCOPY W/ BIOPSIES AND POLYPECTOMY  2011   CORONARY STENT INTERVENTION N/A 09/21/2017   Procedure:  CORONARY STENT INTERVENTION;  Surgeon: Troy Sine, MD;  Location: [facility];  Service: Cardiovascular;  Laterality: N/A;   DILATION AND CURETTAGE OF UTERUS     ESOPHAGOGASTRODUODENOSCOPY N/A 01/30/2022   Procedure: ESOPHAGOGASTRODUODENOSCOPY (EGD);  Surgeon: Milus Banister, MD;  Location: [facility];  Service: Endoscopy;  Laterality: N/A;   EUS N/A 01/30/2022   Procedure: UPPER ENDOSCOPIC ULTRASOUND (EUS) RADIAL;  Surgeon: Milus Banister, MD;  Location: [facility];  Service: Endoscopy;  Laterality: N/A;   EXCISIONAL HEMORRHOIDECTOMY  X 2   FINE NEEDLE ASPIRATION N/A 01/30/2022   Procedure: FINE NEEDLE ASPIRATION (FNA) LINEAR;  Surgeon: Milus Banister, MD;  Location: [facility];  Service: Endoscopy;  Laterality: N/A;   GANGLION CYST EXCISION Left X 3   INTRAMEDULLARY (IM) NAIL INTERTROCHANTERIC Right 08/30/2022   Procedure: INTRAMEDULLARY (IM) NAIL INTERTROCHANTERIC;  Surgeon: Altamese Warrick, MD;  Location: [facility];  Service: Orthopedics;  Laterality: Right;   INTRAVASCULAR PRESSURE WIRE/FFR STUDY N/A 09/17/2017   Procedure: INTRAVASCULAR PRESSURE WIRE/FFR STUDY;  Surgeon: Nelva Bush, MD;  Location: [facility];  Service: Cardiovascular;  Laterality: N/A;   NASAL SINUS SURGERY     PORTA CATH INSERTION Right 2013   RIGHT/LEFT HEART CATH AND CORONARY ANGIOGRAPHY N/A 09/17/2017   Procedure: RIGHT/LEFT HEART CATH AND CORONARY ANGIOGRAPHY;  Surgeon: Nelva Bush, MD;  Location: [facility];  Service: Cardiovascular;  Laterality: N/A;   TUBAL LIGATION      Assessment & Plan Clinical Impression: Patient is a 81 y.o. right-handed female with history  of myelogenous leukemia in remission, atrial fibrillation on Eliquis, chronic back pain, seizure disorder maintained on Keppra, COPD, quit smoking 21 years ago, CAD with stenting maintained on Plavix, diabetes mellitus, hypertension, chronic diastolic congestive heart failure, uterine/breast cancer, CKD stage III.   Per chart review patient lives with spouse.  She has a 1 level home one-step to entry.  Independent prior to admission and driving.  She still works Public librarian.  Presented 08/29/2022 after sustaining ground-level fall while at work. She reports that she tripped on a rug. No loss of consciousness.  Cranial CT scan negative for acute changes.  X-rays and imaging revealed right intertrochanteric hip fracture.  Underwent intramedullary nailing of right hip 08/30/2022 per Dr. Marcelino Scot.  Weightbearing as tolerated right lower extremity.  She continues on chronic Eliquis as prior to admission as well as her Plavix.  Acute blood loss anemia 9.5 and monitored.  Renal services for history of AKI on CKD creatinine baseline 2.41 on discharge peaking at 3.8.  She did initially receive gentle IV fluids.  Renal ultrasound with no hydronephrosis and latest creatinine 1.80.  IV lasix were given on 9/28 and 9/29 due to volume overload. She has had bouts of urinary retention and Foley tube was placed 9/25 and she was placed on Flomax.  Therapy evaluations completed due to patient decreased functional mobility was admitted for a comprehensive rehab program. Patient transferred to CIR on 09/05/2022 .   Patient currently requires  +2 mod assist  with mobility secondary to muscle weakness and muscle joint tightness, decreased cardiorespiratoy endurance and decreased oxygen support, and decreased standing balance, decreased postural control, and decreased balance strategies.  Prior to hospitalization, patient was independent  with mobility and lived with Spouse in a House home.  Home access is 1, ramp at the rear entranceStairs to enter.  Patient will benefit from skilled PT intervention to maximize safe functional mobility, minimize fall risk, and decrease caregiver burden for planned discharge home with 24 hour assist.  Anticipate patient will benefit from follow up [facility] at discharge.  PT - End of Session Activity Tolerance:  Tolerates 30+ min activity with multiple rests Endurance Deficit: Yes Endurance Deficit Description: requires frequent seated rest breaks due to impaired endurance and pt becoming SOB PT Assessment Rehab Potential (ACUTE/IP ONLY): Good PT Barriers to Discharge: Decreased caregiver support PT Patient demonstrates impairments in the following area(s): Balance;Safety;Skin Integrity;Edema;Endurance;Motor;Nutrition;Pain PT Transfers Functional Problem(s): Bed Mobility;Bed to Chair;Car;Furniture PT Locomotion Functional Problem(s): Ambulation;Stairs;Wheelchair Mobility PT Plan PT Intensity: Minimum of 1-2 x/day ,45 to 90 minutes PT Frequency: 5 out of 7 days PT Duration Estimated Length of Stay: ~2-2.5 weeks PT Treatment/Interventions: Ambulation/gait training;Community reintegration;DME/adaptive equipment instruction;Neuromuscular re-education;Psychosocial support;Stair training;UE/LE Strength taining/ROM;Wheelchair propulsion/positioning;Balance/vestibular training;Discharge planning;Functional electrical stimulation;Pain management;Skin care/wound management;Therapeutic Activities;UE/LE Coordination activities;Disease management/prevention;Functional mobility training;Patient/family education;Splinting/orthotics;Therapeutic Exercise;Visual/perceptual remediation/compensation PT Transfers Anticipated Outcome(s): CGA using LRAD PT Locomotion Anticipated Outcome(s): CGA using LRAD PT Recommendation Recommendations for Other Services: Therapeutic Recreation consult Therapeutic Recreation Interventions: Stress management;Outing/community reintergration Follow Up Recommendations: Home health PT;24 hour supervision/assistance Patient destination: Home Equipment Recommended: To be determined   PT Evaluation Precautions/Restrictions Precautions Precautions: Fall;Other (comment) Precaution Comments: monitor SpO2 to maintain >92% (up to 2L of O2 via nasal cannula as needed), hx of seizure disorder, no  R LE ROM restrictions Restrictions Weight Bearing Restrictions: Yes RLE Weight Bearing: Weight bearing as tolerated Pain Pain Assessment Pain Scale: 0-10 Pain Score: 5  Pain Type: Acute pain;Surgical pain Pain Location: Leg Pain Orientation: Right Pain Descriptors / Indicators: Aching  Pain Onset: On-going Patients Stated Pain Goal: 0 Pain Intervention(s): Pain med given for lower pain score than stated, per patient request;Rest;Relaxation;Distraction;Elevated extremity;Emotional support Pain Interference Pain Interference Pain Effect on Sleep: 3. Frequently (states pain meds "ease it off") Pain Interference with Therapy Activities: 2. Occasionally Pain Interference with Day-to-Day Activities: 1. Rarely or not at all Home Living/Prior [place] Available Help at Discharge: Family;Available 24 hours/day (husband can only provide supervision) Type of Home: House Home Access: Stairs to enter [employer] of Steps: 1 step-up on porch with bilateral railings & 1 step-up into the house on front, ramp at the rear entrance Entrance Stairs-Rails: Can reach both Home Layout: One level (has a basement but she doesn't go down there) Bathroom Shower/Tub: Chiropodist: Handicapped height  Lives With: Spouse (husband, Gwyndolyn Saxon) Prior Function Level of Independence: Independent with gait;Independent with transfers;Independent with homemaking with ambulation  Able to Take Stairs?: Yes Driving: Yes Vocation: Part time employment Vocation Requirements: makes eye glasses Vision/Perception  Vision - History Ability to See in Adequate Light: 0 Adequate Perception Perception: Within Functional Limits Praxis Praxis: Intact  Cognition Overall Cognitive Status: Within Functional Limits for tasks assessed Arousal/Alertness: Awake/alert Orientation Level: Oriented X4 Day of Week: Correct Attention: Focused;Sustained;Selective Focused Attention: Appears  intact Sustained Attention: Appears intact Selective Attention: Appears intact Memory: Appears intact Awareness: Appears intact Problem Solving: Appears intact Safety/Judgment: Appears intact Sensation  Sensation Light Touch: Appears Intact Hot/Cold: Not tested Proprioception: Appears Intact Stereognosis: Not tested Coordination Gross Motor Movements are Fluid and Coordinated: No Fine Motor Movements are Fluid and Coordinated: Yes Coordination and Movement Description: impaired due to R LE pain and weakness with generalized deconditioning Motor  Motor Motor: Other (comment) Motor - Skilled Clinical Observations: impaired due to R LE pain and weakness as well as generalized deconditioning   Trunk/Postural Assessment  Cervical Assessment Cervical Assessment: Exceptions to [facility] (slight forward head) Thoracic Assessment Thoracic Assessment: Exceptions to [facility] (rounded shoulders) Lumbar Assessment Lumbar Assessment: Exceptions to [facility] (flexible posterior pelvic tilt in sitting) Postural Control Postural Control: Deficits on evaluation Trunk Control: supervision sitting EOB  Balance Balance Balance Assessed: Yes Static Sitting Balance Static Sitting - Balance Support: Feet supported;Bilateral upper extremity supported Static Sitting - Level of Assistance: 5: Stand by assistance Dynamic Sitting Balance Dynamic Sitting - Balance Support: Feet supported Dynamic Sitting - Level of Assistance: Other (comment) (CGA) Static Standing Balance Static Standing - Balance Support: During functional activity;Bilateral upper extremity supported Static Standing - Level of Assistance: 3: Mod assist Dynamic Standing Balance Dynamic Standing - Balance Support: During functional activity;Bilateral upper extremity supported Dynamic Standing - Level of Assistance: 2: Max assist;Other (comment) (+2 for safety) Extremity Assessment  RLE Assessment RLE Assessment: Exceptions to [facility] Active Range of  Motion (AROM) Comments: limited due to pain General Strength Comments: assessed in sitting - pt reports pain with any resistance applied for strength assessment at any joint RLE Strength Right Hip Flexion: 2+/5 Right Knee Flexion: 2+/5 Right Knee Extension: 3-/5 Right Ankle Dorsiflexion: 3/5 Right Ankle Plantar Flexion: 3/5 LLE Assessment LLE Assessment: Exceptions to [facility] Active Range of Motion (AROM) Comments: WFL General Strength Comments: assessed in sitting LLE Strength Left Hip Flexion: 3+/5 Left Knee Flexion: 4-/5 Left Knee Extension: 4/5 Left Ankle Dorsiflexion: 4/5 Left Ankle Plantar Flexion: 4/5  Care Tool Care Tool Bed Mobility Roll left and right activity   Roll left and right assist level: Moderate Assistance - Patient 50 - 74%    Sit to lying  activity   Sit to lying assist level: Moderate Assistance - Patient 50 - 74%    Lying to sitting on side of bed activity   Lying to sitting on side of bed assist level: the ability to move from lying on the back to sitting on the side of the bed with no back support.: Moderate Assistance - Patient 50 - 74%     Care Tool Transfers Sit to stand transfer   Sit to stand assist level: Moderate Assistance - Patient 50 - 74%    Chair/bed transfer   Chair/bed transfer assist level: Maximal Assistance - Patient 25 - 49%     Toilet transfer   Assist Level: Maximal Assistance - Patient 24 - 49% (to [facility])    Car transfer Car transfer activity did not occur: Safety/medical concerns        Care Tool Locomotion Ambulation   Assist level: 2 helpers Assistive device: Ethelene Hal Max distance: 47ft  Walk 10 feet activity Walk 10 feet activity did not occur: Safety/medical concerns       Walk 50 feet with 2 turns activity Walk 50 feet with 2 turns activity did not occur: Safety/medical concerns      Walk 150 feet activity Walk 150 feet activity did not occur: Safety/medical concerns      Walk 10 feet on uneven surfaces activity  Walk 10 feet on uneven surfaces activity did not occur: Safety/medical concerns      Stairs Stair activity did not occur: Safety/medical concerns        Walk up/down 1 step activity Walk up/down 1 step or curb (drop down) activity did not occur: Safety/medical concerns      Walk up/down 4 steps activity Walk up/down 4 steps activity did not occur: Safety/medical concerns      Walk up/down 12 steps activity Walk up/down 12 steps activity did not occur: Safety/medical concerns      Pick up small objects from floor   Pick up small object from the floor assist level: Dependent - Patient 0%    Wheelchair Is the patient using a wheelchair?: Yes Type of Wheelchair: Manual   Wheelchair assist level: Dependent - Patient 0% Max wheelchair distance: >18ft  Wheel 50 feet with 2 turns activity   Assist Level: Dependent - Patient 0%  Wheel 150 feet activity   Assist Level: Dependent - Patient 0%    Refer to Care Plan for Long Term Goals  SHORT TERM GOAL WEEK 1 PT Short Term Goal 1 (Week 1): Pt will perform supine<>sit with min assist consistently PT Short Term Goal 2 (Week 1): Pt will perform sit<>stands using LRAD with min assist PT Short Term Goal 3 (Week 1): Pt will perform bed<>chair transfers using LRAD with min assist PT Short Term Goal 4 (Week 1): Pt will ambulate at least 37ft using LRAD with min assist (+2 for safety as needed)  Recommendations for other services: Therapeutic Recreation  Stress management and Outing/community reintegration  Skilled Therapeutic Intervention Pt received supine in bed on 2L of O2 via nasal cannula and agreeable to therapy session. Pt remained on 2L of O2 throughout session with SpO2 >95% throughout - pt noted to be wheezing once EOB and she reports she needs a breathing treatment, notified nurse and Respiratory Therapist present during session to administer for improved activity tolerance and participation in PT. Evaluation completed (see details  above) with patient education regarding purpose of PT evaluation, PT POC and goals, therapy schedule, weekly team meetings, and other  CIR information including safety plan and fall risk safety. Pt performed the below functional mobility tasks with the specified levels of skilled cuing and assistance. Pt reports she thinks she may have had a BM in her brief - checked and clean - pt agreeable to transfer to [facility] to attempt continent void - voided bowels (pt has foley and nurse performed foley care after BM). Pt performed stand pivot transfers, no AD, with mod/max assist during session and relies heavily on B UE support on therapist, bedrails, and armrests - +2 assist present for all toileting tasks to perform LB clothing management and peri-care. Gait training initiated using Ethelene Hal with +2 for w/c follow and O2 line management - pt shuffles, slowly slides her feet forward on the ground and is able to take ~6 steps total before reporting significant fatigue and requesting to sit - pt keeps very forward flexed trunk relying heavily on B UE support on AD to maintain upright and off-weight R LE during stance for pain management with therapist guarding R knee for safety (no buckling occurred but pt keeps partial bend). At end of session, pt left seated in w/c with needs in reach and seat belt alarm on - nurse aware of pt's position.  Mobility Bed Mobility Bed Mobility: Sit to Supine;Supine to Sit Rolling Right: Maximal Assistance - Patient 25-49% Rolling Left: Maximal Assistance - Patient 25-49% Supine to Sit: Moderate Assistance - Patient 50-74% Sit to Supine: Moderate Assistance - Patient 50-74% Transfers Transfers: Sit to Stand;Stand to Sit;Stand Pivot Transfers Sit to Stand: Moderate Assistance - Patient 50-74% Stand to Sit: Moderate Assistance - Patient 50-74% Stand Pivot Transfers: Moderate Assistance - Patient 50 - 74%;Maximal Assistance - Patient 25 - 49% Stand Pivot Transfer Details: Tactile cues  for sequencing;Tactile cues for weight shifting;Manual facilitation for weight shifting;Manual facilitation for placement;Verbal cues for sequencing;Verbal cues for technique;Verbal cues for precautions/safety;Tactile cues for weight beaing;Tactile cues for posture Locomotion  Gait Ambulation: Yes Gait Assistance: 2 Helpers;Moderate Assistance - Patient 50-74% (+2 assist for w/c follow and O2 line management) Gait Distance (Feet): 5 Feet Assistive device: Eva walker Gait Assistance Details: Verbal cues for technique;Verbal cues for sequencing;Verbal cues for gait pattern;Tactile cues for initiation;Tactile cues for sequencing;Tactile cues for weight shifting;Verbal cues for precautions/safety;Manual facilitation for weight shifting;Verbal cues for safe use of DME/AE;Tactile cues for posture;Visual cues for safe use of DME/AE Gait Gait: Yes Gait Pattern: Impaired Gait Pattern: Poor foot clearance - left;Poor foot clearance - right;Trunk flexed;Shuffle;Right flexed knee in stance;Decreased hip/knee flexion - right;Decreased hip/knee flexion - left;Decreased step length - left;Step-to pattern Stairs / Additional Locomotion Stairs: No Wheelchair Mobility Wheelchair Mobility: No   Discharge Criteria: Patient will be discharged from PT if patient refuses treatment 3 consecutive times without medical reason, if treatment goals not met, if there is a change in medical status, if patient makes no progress towards goals or if patient is discharged from hospital.  The above assessment, treatment plan, treatment alternatives and goals were discussed and mutually agreed upon: by patient  Tawana Scale , PT, DPT, NCS, CSRS 09/06/2022, 12:50 PM

## 2022-09-06 NOTE — Plan of Care (Signed)
  Problem: Acute Rehab OT Goals (only OT should resolve) Goal: Pt. Will Perform Upper Body Bathing Flowsheets (Taken 09/06/2022 1257) Pt Will Perform Upper Body Bathing: with set-up   Problem: Acute Rehab OT Goals (only OT should resolve) Goal: Pt. Will Perform Lower Body Bathing Flowsheets (Taken 09/06/2022 1257) Pt Will Perform Lower Body Bathing: with set-up   Problem: Acute Rehab OT Goals (only OT should resolve) Goal: Pt. Will Perform Upper Body Dressing Flowsheets (Taken 09/06/2022 1257) Pt Will Perform Upper Body Dressing: with set-up   Problem: Acute Rehab OT Goals (only OT should resolve) Goal: Pt. Will Perform Lower Body Dressing Flowsheets (Taken 09/06/2022 1257) Pt Will Perform Lower Body Dressing:  with supervision  with adaptive equipment   Problem: Acute Rehab OT Goals (only OT should resolve) Goal: Pt. Will Transfer To Toilet Flowsheets (Taken 09/06/2022 1257) Pt Will Transfer to Toilet: with supervision   Problem: Acute Rehab OT Goals (only OT should resolve) Goal: Pt. Will Perform Tub/Shower Transfer Flowsheets (Taken 09/06/2022 1257) Pt Will Perform Tub/Shower Transfer: with min guard assist

## 2022-09-06 NOTE — Plan of Care (Signed)
  Problem: RH Balance Goal: LTG Patient will maintain dynamic sitting balance (PT) Description: LTG:  Patient will maintain dynamic sitting balance with assistance during mobility activities (PT) Flowsheets (Taken 09/06/2022 1846) LTG: Pt will maintain dynamic sitting balance during mobility activities with:: Independent with assistive device  Goal: LTG Patient will maintain dynamic standing balance (PT) Description: LTG:  Patient will maintain dynamic standing balance with assistance during mobility activities (PT) Flowsheets (Taken 09/06/2022 1846) LTG: Pt will maintain dynamic standing balance during mobility activities with:: Contact Guard/Touching assist   Problem: Sit to Stand Goal: LTG:  Patient will perform sit to stand with assistance level (PT) Description: LTG:  Patient will perform sit to stand with assistance level (PT) Flowsheets (Taken 09/06/2022 1846) LTG: PT will perform sit to stand in preparation for functional mobility with assistance level: Contact Guard/Touching assist   Problem: RH Bed Mobility Goal: LTG Patient will perform bed mobility with assist (PT) Description: LTG: Patient will perform bed mobility with assistance, with/without cues (PT). Flowsheets (Taken 09/06/2022 1846) LTG: Pt will perform bed mobility with assistance level of: Supervision/Verbal cueing   Problem: RH Bed to Chair Transfers Goal: LTG Patient will perform bed/chair transfers w/assist (PT) Description: LTG: Patient will perform bed to chair transfers with assistance (PT). Flowsheets (Taken 09/06/2022 1846) LTG: Pt will perform Bed to Chair Transfers with assistance level: Contact Guard/Touching assist   Problem: RH Car Transfers Goal: LTG Patient will perform car transfers with assist (PT) Description: LTG: Patient will perform car transfers with assistance (PT). Flowsheets (Taken 09/06/2022 1846) LTG: Pt will perform car transfers with assist:: Contact Guard/Touching assist   Problem: RH  Ambulation Goal: LTG Patient will ambulate in controlled environment (PT) Description: LTG: Patient will ambulate in a controlled environment, # of feet with assistance (PT). Flowsheets (Taken 09/06/2022 1846) LTG: Pt will ambulate in controlled environ  assist needed:: Contact Guard/Touching assist LTG: Ambulation distance in controlled environment: 41f using LRAD Goal: LTG Patient will ambulate in home environment (PT) Description: LTG: Patient will ambulate in home environment, # of feet with assistance (PT). Flowsheets (Taken 09/06/2022 1846) LTG: Pt will ambulate in home environ  assist needed:: Contact Guard/Touching assist LTG: Ambulation distance in home environment: 438fusing LRAD

## 2022-09-06 NOTE — Progress Notes (Signed)
Occupational Therapy Session Note  Patient Details  Name: [name] MRN: [id number] Date of Birth: 1941-02-26  Today's Date: 09/06/2022 OT Individual Time: 1115-1200 OT Individual Time Calculation (min): 45 min    Short Term Goals: Week 1:  OT Short Term Goal 1 (Week 1): Patient to perform transfer to [facility] with Mod A using LRAD OT Short Term Goal 2 (Week 1): Patient to perform LE dressing with AE as needed OT Short Term Goal 3 (Week 1): Patient to perform bathing with Mod A and AE as needed OT Short Term Goal 4 (Week 1): Patient to perform shower transfer with Mod A and LRAD  Skilled Therapeutic Interventions/Progress Updates:  Balance/vestibular training;Discharge planning;Pain management;Self Care/advanced ADL retraining;Therapeutic Activities;Functional mobility training;Patient/family education;Therapeutic Exercise;UE/LE Strength taining/ROM;DME/adaptive equipment instruction;Community reintegration   Therapy Documentation Precautions:  Precautions Precautions: Fall, Other (comment) Precaution Comments: 2L via [place], hx of seizure disorder, no R LE ROM restrictions Restrictions Weight Bearing Restrictions: Yes RLE Weight Bearing: Weight bearing as tolerated General: General Chart Reviewed: Yes Response to Previous Treatment: Not applicable Family/Caregiver Present: No Vital Signs:  Pain: Pain Assessment Pain Scale: 0-10 Pain Score: 7  Pain Location: Leg Pain Orientation: Right Pain Onset: On-going Patients Stated Pain Goal: 0 Pain Intervention(s): Medication (See eMAR) ADL: ADL Eating: Modified independent Where Assessed-Eating: Bed level Grooming: Setup Where Assessed-Grooming: Wheelchair Upper Body Bathing: Minimal assistance Where Assessed-Upper Body Bathing: Bed level Lower Body Bathing: Dependent Where Assessed-Lower Body Bathing: Bed level Upper Body Dressing: Moderate assistance Where Assessed-Upper Body Dressing: Edge of bed Lower Body Dressing:  Dependent Where Assessed-Lower Body Dressing: Edge of bed Toileting: Dependent Where Assessed-Toileting: Bed level Toilet Transfer: Not assessed Vision Baseline Vision/History: 1 Wears glasses Patient Visual Report: No change from baseline Perception  Perception: Within Functional Limits Praxis Praxis: Intact      Other Treatments:  Patient received still sitting up from AM treatment. Patient reports feeling well and not too fatigued with time OOB. Patient educated on transfer set up and safety and w/c management. Discussed need to [facility] if the home is difficult to navigate with a RW. Patient also stated having a hospital bed she could use. Transfer with max of one stand pivot towards the left leg. Patient able to use the bed rail for support. Sitting EOB with Good static balance. Patient assisted to supine with BLE supported and UB assist. Once in bed patient able to use rails to reposition herself towards the middle of the bed. Assisted to doff pants to ensure catheter was set to drain properly. Patient able to roll with only min/mod assist to [facility] each LE so therapist could pull pants down. Patient able to perform simple UE endurance tasks supine. Maintained O2 above 97% for 5 minutes on room air. Patient requesting to sit on the EOB to eat her lunch. Assisted from supine to sitting with only mod assist to help RLE and to sit all the way up right. Good motivation and participation displayed by patient.   Therapy/Group: Individual Therapy  [name] 09/06/2022, 1:02 PM

## 2022-09-06 NOTE — Evaluation (Signed)
Occupational Therapy Assessment and Plan  Patient Details  Name: [name] MRN: [id number] Date of Birth: 08/08/1941  OT Diagnosis: muscle weakness (generalized) and swelling of limb Rehab Potential:   ELOS: 8   Today's Date: 09/06/2022 OT Individual Time: 2355-7322 OT Individual Time Calculation (min): 26 min     Hospital Problem: Principal Problem:   Intertrochanteric fracture of right hip ([place])   Past Medical History:  Past Medical History:  Diagnosis Date   Acute myelogenous leukemia ([place]) 02/2014   Acute pancreatitis    Arthritis    "back" (09/17/2017)   Atrial flutter ([place])    Cardiomyopathy ([place])    Chronic kidney disease, stage II (mild)    Chronic lower back pain    Colon polyps 04/29/2010   TUBULAR ADENOMA AND A SERRATED ADENOMA   COPD (chronic obstructive pulmonary disease) ([place])    CORONARY ARTERY DISEASE 12/24/2007   DIABETES MELLITUS, TYPE II 07/13/2007   History of blood transfusion 2015   "related to leukemia"   History of kidney stones 12/24/2007   History of uterine cancer    HYPERLIPIDEMIA 12/24/2007   HYPERTENSION 07/13/2007   NSTEMI (non-ST elevated myocardial infarction) ([place]) 09/17/2017   Uterine cancer ([place])    Past Surgical History:  Past Surgical History:  Procedure Laterality Date   ABDOMINAL HYSTERECTOMY     "still have my ovaries"   BREAST BIOPSY Left 09/22/2019   BREAST LUMPECTOMY Left 10/24/2019   BREAST LUMPECTOMY WITH RADIOACTIVE SEED LOCALIZATION Left 10/24/2019   Procedure: LEFT BREAST LUMPECTOMY WITH RADIOACTIVE SEED LOCALIZATION;  Surgeon: Jovita Kussmaul, MD;  Location: [facility];  Service: General;  Laterality: Left;   CARDIAC CATHETERIZATION  2002   CARDIAC CATHETERIZATION  09/17/2017   CATARACT EXTRACTION W/ INTRAOCULAR LENS  IMPLANT, BILATERAL Bilateral    COLONOSCOPY W/ BIOPSIES AND POLYPECTOMY  2011   CORONARY STENT INTERVENTION N/A 09/21/2017   Procedure: CORONARY STENT INTERVENTION;  Surgeon:  Troy Sine, MD;  Location: [facility];  Service: Cardiovascular;  Laterality: N/A;   DILATION AND CURETTAGE OF UTERUS     ESOPHAGOGASTRODUODENOSCOPY N/A 01/30/2022   Procedure: ESOPHAGOGASTRODUODENOSCOPY (EGD);  Surgeon: Milus Banister, MD;  Location: [facility];  Service: Endoscopy;  Laterality: N/A;   EUS N/A 01/30/2022   Procedure: UPPER ENDOSCOPIC ULTRASOUND (EUS) RADIAL;  Surgeon: Milus Banister, MD;  Location: [facility];  Service: Endoscopy;  Laterality: N/A;   EXCISIONAL HEMORRHOIDECTOMY  X 2   FINE NEEDLE ASPIRATION N/A 01/30/2022   Procedure: FINE NEEDLE ASPIRATION (FNA) LINEAR;  Surgeon: Milus Banister, MD;  Location: [facility];  Service: Endoscopy;  Laterality: N/A;   GANGLION CYST EXCISION Left X 3   INTRAMEDULLARY (IM) NAIL INTERTROCHANTERIC Right 08/30/2022   Procedure: INTRAMEDULLARY (IM) NAIL INTERTROCHANTERIC;  Surgeon: Altamese Logan, MD;  Location: [facility];  Service: Orthopedics;  Laterality: Right;   INTRAVASCULAR PRESSURE WIRE/FFR STUDY N/A 09/17/2017   Procedure: INTRAVASCULAR PRESSURE WIRE/FFR STUDY;  Surgeon: Nelva Bush, MD;  Location: [facility];  Service: Cardiovascular;  Laterality: N/A;   NASAL SINUS SURGERY     PORTA CATH INSERTION Right 2013   RIGHT/LEFT HEART CATH AND CORONARY ANGIOGRAPHY N/A 09/17/2017   Procedure: RIGHT/LEFT HEART CATH AND CORONARY ANGIOGRAPHY;  Surgeon: Nelva Bush, MD;  Location: [facility];  Service: Cardiovascular;  Laterality: N/A;   TUBAL LIGATION      Assessment & Plan Clinical Impression: Patient is a 81 year old right-handed female with history of myelogenous leukemia in remission, atrial  fibrillation on Eliquis, chronic back pain, seizure disorder maintained on Keppra, COPD, quit smoking 21 years ago, CAD with stenting maintained on Plavix, diabetes mellitus, hypertension, chronic diastolic congestive heart failure, uterine/breast cancer, CKD stage III.  Per chart review patient lives  with spouse.  She has a 1 level home one-step to entry.  Independent prior to admission and driving.  She still works Public librarian.  Presented 08/29/2022 after sustaining ground-level fall while at work. She reports that she tripped on a rug. No loss of consciousness.  Cranial CT scan negative for acute changes.  X-rays and imaging revealed right intertrochanteric hip fracture.  Underwent intramedullary nailing of right hip 08/30/2022 per Dr. Marcelino Scot.  Weightbearing as tolerated right lower extremity.  She continues on chronic Eliquis as prior to admission as well as her Plavix.  Acute blood loss anemia 9.5 and monitored.  Renal services for history of AKI on CKD creatinine baseline 2.41 on discharge peaking at 3.8.  She did initially receive gentle IV fluids.  Renal ultrasound with no hydronephrosis and latest creatinine 1.80.  IV lasix were given on 9/28 and 9/29 due to volume overload. She has had bouts of urinary retention and Foley tube was placed 9/25 and she was placed on Flomax.  Therapy evaluations completed due to patient decreased functional mobility was admitted for a comprehensive rehab program.    Patient currently requires max with basic self-care skills secondary to muscle weakness and muscle joint tightness.  Prior to hospitalization, patient could complete ADL's with  no assistance .  Patient will benefit from skilled intervention to increase independence with basic self-care skills prior to discharge home with care partner.  Anticipate patient will require intermittent supervision and follow up home health.  OT - End of Session Activity Tolerance: Improving OT Assessment OT Patient demonstrates impairments in the following area(s): Balance;Pain;Endurance OT Basic ADL's Functional Problem(s): Bathing;Dressing;Toileting OT Advanced ADL's Functional Problem(s): Simple Meal Preparation OT Transfers Functional Problem(s): Toilet;Tub/Shower OT Plan OT Intensity: Minimum of 1-2 x/day, 45  to 90 minutes OT Frequency: 5 out of 7 days OT Duration/Estimated Length of Stay: 14 OT Treatment/Interventions: Balance/vestibular training;Discharge planning;Pain management;Self Care/advanced ADL retraining;Therapeutic Activities;Functional mobility training;Patient/family education;Therapeutic Exercise;UE/LE Strength taining/ROM;DME/adaptive equipment instruction;Community reintegration OT Basic Self-Care Anticipated Outcome(s): SBA OT Toileting Anticipated Outcome(s): SBA OT Bathroom Transfers Anticipated Outcome(s): CGA OT Recommendation Patient destination: Home Follow Up Recommendations: Home health OT Equipment Recommended: 3 in 1 bedside comode;Tub/shower bench;Rolling walker with 5" wheels   OT Evaluation Precautions/Restrictions  Precautions Precautions: Fall Precaution Comments: 2L via [place] Restrictions Weight Bearing Restrictions: Yes RLE Weight Bearing: Weight bearing as tolerated General Chart Reviewed: Yes Response to Previous Treatment: Not applicable Family/Caregiver Present: No Vital Signs  Pain Pain Assessment Pain Scale: 0-10 Pain Score: 7  Pain Location: Leg Pain Orientation: Right Pain Onset: On-going Patients Stated Pain Goal: 0 Pain Intervention(s): Medication (See eMAR) Home Living/Prior [place] expects to be discharged to:: Private residence Living Arrangements: Spouse/significant other Available Help at Discharge: Family, Available PRN/intermittently Type of Home: House Home Access: Stairs to enter [employer] of Steps: 1, ramp at the rear entrance Home Layout: One level Bathroom Shower/Tub: Chiropodist: Handicapped height  Lives With: Spouse IADL History Homemaking Responsibilities: Yes Meal Prep Responsibility: Therapist, occupational Responsibility: Primary Cleaning Responsibility: Primary Current License: Yes Mode of Transportation: Car Occupation: Part time employment Prior  Function Level of Independence: Independent with basic ADLs, Independent with gait  Able to Take Stairs?: Yes Driving: Yes Vocation: Part  time employment Vision Baseline Vision/History: 1 Wears glasses Ability to See in Adequate Light: 0 Adequate Patient Visual Report: No change from baseline Perception  Perception: Within Functional Limits Praxis Praxis: Intact Cognition Cognition Overall Cognitive Status: Within Functional Limits for tasks assessed Arousal/Alertness: Awake/alert Orientation Level: Person;Place;Situation Person: Oriented Place: Oriented Situation: Oriented Memory: Appears intact Awareness: Appears intact Problem Solving: Appears intact Safety/Judgment: Appears intact Brief Interview for Mental Status (BIMS) Repetition of Three Words (First Attempt): 2 Temporal Orientation: Year: Correct Temporal Orientation: Month: Accurate within 5 days Temporal Orientation: Day: Incorrect Recall: "Sock": Yes, after cueing ("something to wear") Recall: "Blue": Yes, no cue required Recall: "Bed": Yes, no cue required BIMS Summary Score: 12 Sensation Sensation Light Touch: Appears Intact Coordination Gross Motor Movements are Fluid and Coordinated: Yes Fine Motor Movements are Fluid and Coordinated: Yes Motor  Motor Motor: Within Functional Limits  Trunk/Postural Assessment     Balance   Extremity/Trunk Assessment RUE Assessment RUE Assessment: Within Functional Limits LUE Assessment LUE Assessment: Within Functional Limits  Care Tool Care Tool Self Care Eating        Oral Care         Bathing              Upper Body Dressing(including orthotics)            Lower Body Dressing (excluding footwear)          Putting on/Taking off footwear             Care Tool Toileting Toileting activity         Care Tool Bed Mobility Roll left and right activity        Sit to lying activity        Lying to sitting on side of bed activity          Care Tool Transfers Sit to stand transfer        Chair/bed transfer         Toilet transfer         Care Tool Cognition  Expression of Ideas and Wants    Understanding Verbal and Non-Verbal Content     Memory/Recall Ability     Refer to Care Plan for Long Term Goals  SHORT TERM GOAL WEEK 1 OT Short Term Goal 1 (Week 1): Patient to perform transfer to [facility] with Mod A using LRAD OT Short Term Goal 2 (Week 1): Patient to perform LE dressing with AE as needed OT Short Term Goal 3 (Week 1): Patient to perform bathing with Mod A and AE as needed OT Short Term Goal 4 (Week 1): Patient to perform shower transfer with Mod A and LRAD  Recommendations for other services: None    Skilled Therapeutic Intervention ADL ADL Eating: Modified independent Where Assessed-Eating: Bed level Grooming: Setup Where Assessed-Grooming: Wheelchair Upper Body Bathing: Minimal assistance Where Assessed-Upper Body Bathing: Bed level Lower Body Bathing: Dependent Where Assessed-Lower Body Bathing: Bed level Upper Body Dressing: Moderate assistance Where Assessed-Upper Body Dressing: Edge of bed Lower Body Dressing: Dependent Where Assessed-Lower Body Dressing: Edge of bed Toileting: Dependent Where Assessed-Toileting: Bed level Toilet Transfer: Not assessed Mobility  Bed Mobility Bed Mobility: Rolling Right;Rolling Left Rolling Right: Maximal Assistance - Patient 25-49% Rolling Left: Maximal Assistance - Patient 25-49% Transfers Sit to Stand: Moderate Assistance - Patient 50-74%   Discharge Criteria: Patient will be discharged from OT if patient refuses treatment 3 consecutive times without medical reason, if treatment goals not met, if there is  a change in medical status, if patient makes no progress towards goals or if patient is discharged from hospital.  The above assessment, treatment plan, treatment alternatives and goals were discussed and mutually agreed upon: by  patient  [name] 09/06/2022, 12:53 PM

## 2022-09-07 ENCOUNTER — Inpatient Hospital Stay (HOSPITAL_COMMUNITY): Payer: Worker's Compensation

## 2022-09-07 DIAGNOSIS — M7989 Other specified soft tissue disorders: Secondary | ICD-10-CM | POA: Diagnosis not present

## 2022-09-07 DIAGNOSIS — R609 Edema, unspecified: Secondary | ICD-10-CM | POA: Diagnosis not present

## 2022-09-07 LAB — GLUCOSE, CAPILLARY
Glucose-Capillary: 138 mg/dL — ABNORMAL HIGH (ref 70–99)
Glucose-Capillary: 161 mg/dL — ABNORMAL HIGH (ref 70–99)
Glucose-Capillary: 134 mg/dL — ABNORMAL HIGH (ref 70–99)
Glucose-Capillary: 188 mg/dL — ABNORMAL HIGH (ref 70–99)

## 2022-09-07 NOTE — Progress Notes (Signed)
Lower extremity venous has been completed.   Preliminary results in CV Proc.   [name] [name] 09/07/2022 9:12 AM

## 2022-09-07 NOTE — Discharge Instructions (Addendum)
Inpatient Rehab Discharge Instructions  [name] Discharge date and time: No discharge date for patient encounter.   Activities/Precautions/ Functional Status: Activity: Weightbearing as tolerated Diet: Diabetic diet Wound Care: Routine skin checks Functional status:  ___ No restrictions     ___ Walk up steps independently ___ 24/7 supervision/assistance   ___ Walk up steps with assistance ___ Intermittent supervision/assistance  ___ Bathe/dress independently ___ Walk with walker     _x__ Bathe/dress with assistance ___ Walk Independently    ___ Shower independently ___ Walk with assistance    ___ Shower with assistance ___ No alcohol     ___ Return to work/school ________  COMMUNITY REFERRALS UPON DISCHARGE:    Home Health:   PT     OT                   Agency: Worker's [employer] to Determine Phone:    Medical Equipment/Items Ordered: Engineer, civil (consulting), Tub Producer, television/film/video, 20x16 wheelchair, [employer], Home oxygen concentrator, Bedrail, [employer], 20x16 [employer] and [employer]                                                  Agency/Supplier: [employer]er[employer]   Special Instructions:  No driving smoking or alcohol  My questions have been answered and I understand these instructions. I will adhere to these goals and the provided educational materials after my discharge from the hospital.  Patient/Caregiver Signature _______________________________ Date __________  Clinician Signature _______________________________________ Date __________  Please bring this form and your medication list with you to all your follow-up doctor's appointments.

## 2022-09-07 NOTE — Progress Notes (Signed)
Physical Therapy Session Note  Patient Details  Name: [name] MRN: [id number] Date of Birth: 02-07-1941  Today's Date: 09/07/2022 PT Individual Time: 1416-1456 PT Individual Time Calculation (min): 40 min   Short Term Goals: Week 1:  PT Short Term Goal 1 (Week 1): Pt will perform supine<>sit with min assist consistently PT Short Term Goal 2 (Week 1): Pt will perform sit<>stands using LRAD with min assist PT Short Term Goal 3 (Week 1): Pt will perform bed<>chair transfers using LRAD with min assist PT Short Term Goal 4 (Week 1): Pt will ambulate at least 89f using LRAD with min assist (+2 for safety as needed)  Skilled Therapeutic Interventions/Progress Updates:   Received pt semi-reclined in bed, pt agreeable to PT treatment, and reported pain 5/10 in R hip and low back (premedicated). Session with emphasis on functional mobility/transfers, dynamic standing balance, generalized strengthening and endurance, and gait training. Pt transferred semi-reclined<>sitting EOB with HOB elevated and supervision/CGA using bedrails. Pt stood from elevated EOB with EHarmon Pierwalker and min A and ambulated [id number] and [id number] with EvaHarmon Pierlker and heavy min A. Of note, pt unable to lift feet with tendency to pivot and slide feet along floor. Pt with increased difficulty weight shifting to RLE and relying heavily on BUE support. Took seated rest break EOB then worked on sit<>stands x2 reps from elevated EOB with min A. While standing, worked on R heel raises 2x10 and R toe raises 2x10. Pt transferred sit<>supine with max A and concluded session with pt semi-reclined in bed, needs within reach, and bed alarm on.   SPO2 At rest at start of session on 3L: 96% At rest at start of session on 2L: 94% Sitting EOB on 2L: 95% Sitting EOB after ambulating on 2L: 92%  Therapy Documentation Precautions:  Precautions Precautions: Fall, Other (comment) Precaution Comments: monitor SpO2 to maintain >92% (up to  2L of O2 via nasal cannula as needed), hx of seizure disorder, no R LE ROM restrictions Restrictions Weight Bearing Restrictions: Yes RLE Weight Bearing: Weight bearing as tolerated  Therapy/Group: Individual Therapy AnnAlfonse Alpers, DPT  09/07/2022, 7:24 AM

## 2022-09-08 DIAGNOSIS — S72144S Nondisplaced intertrochanteric fracture of right femur, sequela: Secondary | ICD-10-CM | POA: Diagnosis not present

## 2022-09-08 DIAGNOSIS — S72144A Nondisplaced intertrochanteric fracture of right femur, initial encounter for closed fracture: Secondary | ICD-10-CM | POA: Diagnosis not present

## 2022-09-08 LAB — CBC WITH DIFFERENTIAL/PLATELET
Abs Immature Granulocytes: 0.01 10*3/uL (ref 0.00–0.07)
Basophils Absolute: 0 10*3/uL (ref 0.0–0.1)
Basophils Relative: 0 %
Eosinophils Absolute: 0.3 10*3/uL (ref 0.0–0.5)
Eosinophils Relative: 6 %
HCT: 28.5 % — ABNORMAL LOW (ref 36.0–46.0)
Hemoglobin: 8.9 g/dL — ABNORMAL LOW (ref 12.0–15.0)
Immature Granulocytes: 0 %
Lymphocytes Relative: 19 %
Lymphs Abs: 0.9 10*3/uL (ref 0.7–4.0)
MCH: 30.5 pg (ref 26.0–34.0)
MCHC: 31.2 g/dL (ref 30.0–36.0)
MCV: 97.6 fL (ref 80.0–100.0)
Monocytes Absolute: 0.6 10*3/uL (ref 0.1–1.0)
Monocytes Relative: 12 %
Neutro Abs: 3.1 10*3/uL (ref 1.7–7.7)
Neutrophils Relative %: 63 %
Platelets: 122 10*3/uL — ABNORMAL LOW (ref 150–400)
RBC: 2.92 MIL/uL — ABNORMAL LOW (ref 3.87–5.11)
RDW: 13.2 % (ref 11.5–15.5)
WBC: 5 10*3/uL (ref 4.0–10.5)
nRBC: 0 % (ref 0.0–0.2)

## 2022-09-08 LAB — COMPREHENSIVE METABOLIC PANEL
ALT: 8 U/L (ref 0–44)
AST: 21 U/L (ref 15–41)
Albumin: 2.3 g/dL — ABNORMAL LOW (ref 3.5–5.0)
Alkaline Phosphatase: 111 U/L (ref 38–126)
Anion gap: 6 (ref 5–15)
BUN: 43 mg/dL — ABNORMAL HIGH (ref 8–23)
CO2: 27 mmol/L (ref 22–32)
Calcium: 7.6 mg/dL — ABNORMAL LOW (ref 8.9–10.3)
Chloride: 107 mmol/L (ref 98–111)
Creatinine, Ser: 1.82 mg/dL — ABNORMAL HIGH (ref 0.44–1.00)
GFR, Estimated: 28 mL/min — ABNORMAL LOW (ref >60)
Glucose, Bld: 207 mg/dL — ABNORMAL HIGH (ref 70–99)
Potassium: 4.9 mmol/L (ref 3.5–5.1)
Sodium: 140 mmol/L (ref 135–145)
Total Bilirubin: 1 mg/dL (ref 0.3–1.2)
Total Protein: 4.7 g/dL — ABNORMAL LOW (ref 6.5–8.1)

## 2022-09-08 LAB — GLUCOSE, CAPILLARY
Glucose-Capillary: 125 mg/dL — ABNORMAL HIGH (ref 70–99)
Glucose-Capillary: 149 mg/dL — ABNORMAL HIGH (ref 70–99)
Glucose-Capillary: 161 mg/dL — ABNORMAL HIGH (ref 70–99)
Glucose-Capillary: 171 mg/dL — ABNORMAL HIGH (ref 70–99)

## 2022-09-08 LAB — VITAMIN D 25 HYDROXY (VIT D DEFICIENCY, FRACTURES): Vit D, 25-Hydroxy: 17.01 ng/mL — ABNORMAL LOW (ref 30–100)

## 2022-09-08 LAB — MAGNESIUM: Magnesium: 2.3 mg/dL (ref 1.7–2.4)

## 2022-09-08 MED ORDER — SODIUM CHLORIDE 0.9% FLUSH
10.0000 mL | INTRAVENOUS | Status: DC | PRN
  Administered 2022-09-09 – 2022-09-27 (×4): 10 mL

## 2022-09-08 MED ORDER — EMPAGLIFLOZIN 10 MG PO TABS
10.0000 mg | ORAL_TABLET | Freq: Every day | ORAL | Status: DC
  Filled 2022-09-08: qty 1

## 2022-09-08 MED ORDER — SODIUM CHLORIDE 0.9% FLUSH
10.0000 mL | Freq: Two times a day (BID) | INTRAVENOUS | Status: DC
  Administered 2022-09-08 – 2022-09-28 (×22): 10 mL

## 2022-09-08 MED ORDER — CAMPHOR-MENTHOL 0.5-0.5 % EX LOTN
TOPICAL_LOTION | Freq: Two times a day (BID) | CUTANEOUS | Status: DC
  Administered 2022-09-19: 1 via TOPICAL
  Filled 2022-09-08: qty 222

## 2022-09-08 MED ORDER — CYANOCOBALAMIN 1000 MCG/ML IJ SOLN
1000.0000 ug | Freq: Once | INTRAMUSCULAR | Status: AC
  Administered 2022-09-08: 1000 ug via INTRAMUSCULAR
  Filled 2022-09-08: qty 1

## 2022-09-08 NOTE — Progress Notes (Signed)
[facility] Individual Statement of Services  Patient Name:  [name]  Date:  09/08/2022  Welcome to the [facility].  Our goal is to provide you with an individualized program based on your diagnosis and situation, designed to meet your specific needs.  With this comprehensive rehabilitation program, you will be expected to participate in at least 3 hours of rehabilitation therapies Monday-Friday, with modified therapy programming on the weekends.  Your rehabilitation program will include the following services:  Physical Therapy (PT), Occupational Therapy (OT), Speech Therapy (ST), 24 hour per day rehabilitation nursing, Therapeutic Recreaction (TR), Neuropsychology, Care Coordinator, Rehabilitation Medicine, Nutrition Services, Pharmacy Services, and Other  Weekly team conferences will be held on Wednesdays to discuss your progress.  Your Inpatient Rehabilitation Care Coordinator will talk with you frequently to get your input and to update you on team discussions.  Team conferences with you and your family in attendance may also be held.  Expected length of stay: 14 Days  Overall anticipated outcome:  MOD I  Depending on your progress and recovery, your program may change. Your Inpatient Rehabilitation Care Coordinator will coordinate services and will keep you informed of any changes. Your Inpatient Rehabilitation Care Coordinator's name and contact numbers are listed  below.  The following services may also be recommended but are not provided by the [facility]:   [place] will be made to provide these services after discharge if needed.  Arrangements include referral to agencies that provide these services.  Your insurance has been verified to be:  [employer] Your primary doctor is:  Isaac Bliss, Rayford Halsted, MD  Pertinent information  will be shared with your doctor and your insurance company.  Inpatient Rehabilitation Care Coordinator:  Erlene Quan, [place] or [phone]  Information discussed with and copy given to patient by: Dyanne Iha, 09/08/2022, 10:27 AM

## 2022-09-08 NOTE — Progress Notes (Signed)
Inpatient Rehabilitation Care Coordinator Assessment and Plan Patient Details  Name: [name] MRN: [id number] Date of Birth: Mar 16, 1941  Today's Date: 09/08/2022  Hospital Problems: Principal Problem:   Intertrochanteric fracture of right hip [facility])  Past Medical History:  Past Medical History:  Diagnosis Date   Acute myelogenous leukemia ([place]) 02/2014   Acute pancreatitis    Arthritis    "back" (09/17/2017)   Atrial flutter ([place])    Cardiomyopathy ([place])    Chronic kidney disease, stage II (mild)    Chronic lower back pain    Colon polyps 04/29/2010   TUBULAR ADENOMA AND A SERRATED ADENOMA   COPD (chronic obstructive pulmonary disease) ([place])    CORONARY ARTERY DISEASE 12/24/2007   DIABETES MELLITUS, TYPE II 07/13/2007   History of blood transfusion 2015   "related to leukemia"   History of kidney stones 12/24/2007   History of uterine cancer    HYPERLIPIDEMIA 12/24/2007   HYPERTENSION 07/13/2007   NSTEMI (non-ST elevated myocardial infarction) ([place]) 09/17/2017   Uterine cancer ([place])    Past Surgical History:  Past Surgical History:  Procedure Laterality Date   ABDOMINAL HYSTERECTOMY     "still have my ovaries"   BREAST BIOPSY Left 09/22/2019   BREAST LUMPECTOMY Left 10/24/2019   BREAST LUMPECTOMY WITH RADIOACTIVE SEED LOCALIZATION Left 10/24/2019   Procedure: LEFT BREAST LUMPECTOMY WITH RADIOACTIVE SEED LOCALIZATION;  Surgeon: Jovita Kussmaul, MD;  Location: [place];  Service: General;  Laterality: Left;   CARDIAC CATHETERIZATION  2002   CARDIAC CATHETERIZATION  09/17/2017   CATARACT EXTRACTION W/ INTRAOCULAR LENS  IMPLANT, BILATERAL Bilateral    COLONOSCOPY W/ BIOPSIES AND POLYPECTOMY  2011   CORONARY STENT INTERVENTION N/A 09/21/2017   Procedure: CORONARY STENT INTERVENTION;  Surgeon: Troy Sine, MD;  Location: [place] [facility];  Service: Cardiovascular;  Laterality: N/A;   DILATION AND CURETTAGE OF UTERUS     ESOPHAGOGASTRODUODENOSCOPY  N/A 01/30/2022   Procedure: ESOPHAGOGASTRODUODENOSCOPY (EGD);  Surgeon: Milus Banister, MD;  Location: [facility];  Service: Endoscopy;  Laterality: N/A;   EUS N/A 01/30/2022   Procedure: UPPER ENDOSCOPIC ULTRASOUND (EUS) RADIAL;  Surgeon: Milus Banister, MD;  Location: [facility];  Service: Endoscopy;  Laterality: N/A;   EXCISIONAL HEMORRHOIDECTOMY  X 2   FINE NEEDLE ASPIRATION N/A 01/30/2022   Procedure: FINE NEEDLE ASPIRATION (FNA) LINEAR;  Surgeon: Milus Banister, MD;  Location: [facility];  Service: Endoscopy;  Laterality: N/A;   GANGLION CYST EXCISION Left X 3   INTRAMEDULLARY (IM) NAIL INTERTROCHANTERIC Right 08/30/2022   Procedure: INTRAMEDULLARY (IM) NAIL INTERTROCHANTERIC;  Surgeon: Altamese [place], MD;  Location: [place];  Service: Orthopedics;  Laterality: Right;   INTRAVASCULAR PRESSURE WIRE/FFR STUDY N/A 09/17/2017   Procedure: INTRAVASCULAR PRESSURE WIRE/FFR STUDY;  Surgeon: Nelva Bush, MD;  Location: [place] [facility];  Service: Cardiovascular;  Laterality: N/A;   NASAL SINUS SURGERY     PORTA CATH INSERTION Right 2013   RIGHT/LEFT HEART CATH AND CORONARY ANGIOGRAPHY N/A 09/17/2017   Procedure: RIGHT/LEFT HEART CATH AND CORONARY ANGIOGRAPHY;  Surgeon: Nelva Bush, MD;  Location: [place] [facility];  Service: Cardiovascular;  Laterality: N/A;   TUBAL LIGATION     Social History:  reports that she quit smoking about 21 years ago. Her smoking use included cigarettes. She has a 20.00 pack-year smoking history. She has never used smokeless tobacco. She reports that she does not drink alcohol and does not use drugs.  Family / [place] Patient Roles: Spouse  Spouse/Significant Other: Alfred Levins Children: Quitman Livings (Daughter) Other Supports: Thad Ranger (Brother), Melynda Ripple (Sister) Ability/Limitations of Caregiver: Spouse unable to provide assistance Caregiver Availability: 24/7 Family Dynamics: support from spouse and daughter  Social  History Preferred language: English Religion: [place] - How often do you need to have someone help you when you read instructions, pamphlets, or other written material from your doctor or pharmacy?: Never Guardian/Conservator: Quitman Livings   Abuse/Neglect Abuse/Neglect Assessment Can Be Completed: Yes Physical Abuse: Denies Verbal Abuse: Denies Sexual Abuse: Denies Exploitation of patient/patient's resources: Denies Self-Neglect: Denies  Patient response to: Social Isolation - How often do you feel lonely or isolated from those around you?: Never  Emotional Status Recent Psychosocial Issues: n/a Psychiatric History: n/a Substance Abuse History: n/a  Patient / Family Perceptions, Expectations & Goals Pt/Family understanding of illness & functional limitations: yes Premorbid pt/family roles/activities: MOD I Anticipated changes in roles/activities/participation: spouse able to provide supervision only. Daughter able to asssit PRN Pt/family expectations/goals: MOD I  [employer]: None Premorbid Home Care/DME Agencies: Other (Comment) Librarian, academic) Transportation available at discharge: daughter Is the patient able to respond to transportation needs?: Yes In the past 12 months, has lack of transportation kept you from medical appointments or from getting medications?: No In the past 12 months, has lack of transportation kept you from meetings, work, or from getting things needed for daily living?: No  Discharge Planning Living Arrangements: Spouse/significant other Support Systems: Spouse/significant other, Children Type of Residence: Private residence Insurance Resources: Multimedia programmer (specify) Financial Resources: Family Support Financial Screen Referred: No Living Expenses: Lives with family Does the patient have any problems obtaining your medications?: No Home Management: Independent Patient/Family  Preliminary Plans: Spouse able to assist with some roles and tasks, daughter will assist primarily Care Coordinator Barriers to Discharge: Lack of/limited family support, Insurance for SNF coverage, Decreased caregiver support, Home environment access/layout Care Coordinator Barriers to Discharge Comments: Rolling walker potentially unable to fit in home. Spouse only able to provide supervision DC Planning Additional Notes/Comments: Rolling Walker unable to fit in home per spouse.  Clinical Impression Sw met with patient and introduced self and explained role. Patient plans to discharge home with MOD I/SUP goals. Spouse unable to provide physical assistance, daughter able to provide PRN assistance.   Dyanne Iha 09/08/2022, 12:31 PM

## 2022-09-08 NOTE — Progress Notes (Signed)
Occupational Therapy Session Note  Patient Details  Name: [name] MRN: [id number] Date of Birth: Nov 27, 1941  Today's Date: 09/08/2022 OT Individual Time: 0900-1000 & 1415-1530 OT Individual Time Calculation (min): 60 min & 75 min   Short Term Goals: Week 1:  OT Short Term Goal 1 (Week 1): Patient to perform transfer to [facility] with Mod A using LRAD OT Short Term Goal 2 (Week 1): Patient to perform LE dressing with AE as needed OT Short Term Goal 3 (Week 1): Patient to perform bathing with Mod A and AE as needed OT Short Term Goal 4 (Week 1): Patient to perform shower transfer with Mod A and LRAD  Skilled Therapeutic Interventions/Progress Updates:  Session 1 Skilled OT intervention completed with focus on toileting needs and functional endurance within a bathing/dressing context. Pt received upright in bed, on RA, no c/o pain. Pt indicated she doffed her [place] to "eat better" and forgot to put it back on. Upon vitals check, O2 sat was noted to be at 79% SPO2. Donned [place], with 2L O2 started with pt able to rebound to 98% with pursed lip breathing. Pt indicated she felt SOB, however did not recognize to donn the [place] again, which could indicate mild safety/cog deficits. Nurse notified of the situation. Education provided about only doffing [place] for short periods of time vs long lengths to prevent desaturation. Remained on 2L via [place] with vitals WNL for rest of session.  Pt requested to try and void as she has been cathed and would like to avoid re-cathing if possible. With [facility] semi-flat, pt transitioned to EOB via helicopter method with mod A for RLE management and mod A for trunk elevation to sitting EOB. Encouraged pt to stand with therapist however pt stated "I use that purple thing (in reference to the stedy) and I won't use anything else. Despite encouragement and education provided about purpose of rehab, OT purpose vs nursing assist, pt declined transfer without stedy. Therapist raised BBSC legs to  promote efficiency with sit > stand after toileting needs. Min A sit > stand from elevated bed, then dependent transfer to [facility].  Pt required extended time to attempt void, however was only able to release a couple drops. Pre-threaded brief/pants for energy conservation, then mod A sit > stand using stedy, and therapist switched out [place] for w/c with cues needed for anterior weight shifting, lifting chest and trunk as pt attempts to place head on stedy pull up bar to elevate her body during stand. Total A for donning brief/pants over hips.  Seated at sink pt doffed shirt, completed bathing, with rashes/red splotchy skin noted under bilateral breasts. Pt indicated that she has been putting powder under them however can't remember if nursing has assessed. Nursing notified of status. Cues needed for donning [place] on as pt noticeably became SOB however preferred to keep the [place] off for duration of UB self-care, then re-donned after min A donning of shirt.   BLE noted to have grade 2 pitting edema in especially in the dorsal aspects of the feet with R worse than L. Per MD order, therapist donned knee high TEDs and larger socks dependently for edema management and educated pt on wearing schedule and purpose.  Pt remained seated in w/c, with BLE elevated, ice applied to R hip for pain reduction, belt alarm on and all needs in reach at end of session.  Session 2 Skilled OT intervention completed with focus on toileting needs, functional sit > stands in prep  for ADL, bed mobility education. Pt received on RA, asleep, with 90% SPO2 reading. Able to rebound >92 % with mobility therefore remained on RA during session.  Pt indicated fatigue and pain in RLE, nurse made aware of medication need and in room to administer. With mod encouragement, pt was agreeable to get OOB for therapy, with request to try and void.  Completed bed mobility to EOB with assist for RLE management and trunk elevation with overall mod A with  HOB flat. With encouragement, pt was agreeable to use bilateral platforms RW for toilet transfer. Completed Mod A sit > stand then min A stand pivot to BBSC with cues for RW positioning with some physical assist needed for RW positioning throughout as pt would slide feet vs stepping for pivot. Total A needed for pants management to doff.  Pt indicated increase in pain to 7/10 on RLE with seated position on BBSC. Therapist applied trash can under RLE to minimize pressure on R thigh as well as limit needed knee flexion due to edema.   With extended time, pt was continent of BM only, unable to void, with nursing aware of now 2 failed trials at voiding. Completed mod A sit > stand then total A needed for pericare and pants management. 4 ft ambulatory transfer towards [facility] then stand pivot to EOB with min-mod A. Physical assist needed for RLE due to fatigue.  Applied anti-itch cream to pt's back per request, then pt agreeable to try standing with less reliance on BUE. Pt utilized LUE to push up from bed with R forearm on R platform of RW to stand with min A. Therapist educated pt that she could try using one hand to push up in prep for using a standard walker without platforms by d/c, however pt indicated she feels more comfortable with these right now.  Education provided on how to use hook method of LLE under RLE for maximizing independence with bed mobility. Pt was able to achieve hook method with supervision, however required max A for EOB > supine for BLE management as pt was unable to manage trunk and legs simultaneously. Would benefit from future practice with this. Doffed TEDS, then pt remained semi-upright with NT present at direct care hand off.   Therapy Documentation Precautions:  Precautions Precautions: Fall, Other (comment) Precaution Comments: monitor SpO2 to maintain >92% (up to 2L of O2 via nasal cannula as needed), hx of seizure disorder, no R LE ROM restrictions Restrictions Weight  Bearing Restrictions: Yes RLE Weight Bearing: Weight bearing as tolerated    Therapy/Group: Individual Therapy  Blase Mess, MS, OTR/L  09/08/2022, 3:57 PM

## 2022-09-08 NOTE — Progress Notes (Signed)
Physical Therapy Session Note  Patient Details  Name: [name] MRN: [id number] Date of Birth: [birth year]  Today's Date: 09/08/2022 PT Individual Time: 1100-1155 PT Individual Time Calculation (min): 55 min   Short Term Goals: Week 1:  PT Short Term Goal 1 (Week 1): Pt will perform supine<>sit with min assist consistently PT Short Term Goal 2 (Week 1): Pt will perform sit<>stands using LRAD with min assist PT Short Term Goal 3 (Week 1): Pt will perform bed<>chair transfers using LRAD with min assist PT Short Term Goal 4 (Week 1): Pt will ambulate at least 26f using LRAD with min assist (+2 for safety as needed)  Skilled Therapeutic Interventions/Progress Updates:   Received pt sitting in WC, pt agreeable to PT treatment, and denied any R hip pain at rest but increased with ambulation. Session with emphasis on functional mobility/transfers, generalized strengthening and endurance, and gait training. Located and set up bilateral platform walker and educated pt on proper technique for gait.   Pt transferred sit<>stand with bilateral PFRW and mod A x 3 trials throughout session. Pt required cues to [facility] rather than pulling up on PFRW and to reach back for WC armrests prior to sitting down. Pt ambulated 318ff[place] and 3 ft backwards with bilateral PFRW and min A. Took seated rest break and performed knee extension 2x10 bilaterally (decreased ROM on RLE) and hip flexion 2x10 bilaterally (AAROM on RLE) for active rest break while therapist made further adjustments to walker. Pt then ambulated additional [id number] and 61ft25fckwards followed by additional 3ft 26fwards and 3ft b561fwards. Concluded session with pt sitting in WC, needs within reach, and seatbelt alarm on. Provided pt with fresh drink.   SPO2 At rest on 2L: 99% After ambulating trial 1 on 2L: 98% After ambulating trial 2 and trial 3 on 2L: 97%  Therapy Documentation Precautions:  Precautions Precautions:  Fall, Other (comment) Precaution Comments: monitor SpO2 to maintain >92% (up to 2L of O2 via nasal cannula as needed), hx of seizure disorder, no R LE ROM restrictions Restrictions Weight Bearing Restrictions: Yes RLE Weight Bearing: Weight bearing as tolerated  Therapy/Group: Individual Therapy [name] MAlfonse AlpersPT  09/08/2022, 7:08 AM

## 2022-09-08 NOTE — Progress Notes (Signed)
Inpatient Rehabilitation  Patient information reviewed and entered into eRehab system by Amariona Rathje Eleana Tocco, OTR/L, Rehab Quality Coordinator.   Information including medical coding, functional ability and quality indicators will be reviewed and updated through discharge.   

## 2022-09-08 NOTE — Progress Notes (Signed)
PROGRESS NOTE   Subjective/Complaints:  No new complaints this morning She asks for her monthly B12 shot She asks to see image of the hardware in her hip She says pain is well controlled with pain medications  ROS: Denies fevers, chills, N/V, abdominal pain, diarrhea, SOB, cough, chest pain, new weakness or paraesthesias.  +hip pain well controlled with pain medications  Objective:   VAS [place] LOWER EXTREMITY VENOUS (DVT)  Result Date: 09/07/2022  Lower Venous DVT Study Patient Name:  [name]  Date of Exam:   09/07/2022 Medical Rec #: [id number]      Accession #:    [id number] Date of Birth: May 11, 1941      Patient Gender: F Patient Age:   81 years Exam Location:  [facility] Procedure:      VAS [place] LOWER EXTREMITY VENOUS (DVT) Referring Phys: Durel Salts --------------------------------------------------------------------------------  Indications: Swelling, and Edema.  Comparison Study: 04/17/22 prior Performing Technologist: Archie Patten RVS  Examination Guidelines: A complete evaluation includes B-mode imaging, spectral Doppler, color Doppler, and power Doppler as needed of all accessible portions of each vessel. Bilateral testing is considered an integral part of a complete examination. Limited examinations for reoccurring indications may be performed as noted. The reflux portion of the exam is performed with the patient in reverse Trendelenburg.  +--------+---------------+---------+-----------+----------+--------------------+ RIGHT   CompressibilityPhasicitySpontaneityPropertiesThrombus Aging       +--------+---------------+---------+-----------+----------+--------------------+ CFV     Full           Yes      Yes                                       +--------+---------------+---------+-----------+----------+--------------------+ SFJ     Full                                                               +--------+---------------+---------+-----------+----------+--------------------+ FV Prox Full                                                              +--------+---------------+---------+-----------+----------+--------------------+ FV Mid                 Yes      Yes                  patent by color                                                           doppler              +--------+---------------+---------+-----------+----------+--------------------+  FV                     Yes      Yes                                       Distal                                                                    +--------+---------------+---------+-----------+----------+--------------------+ PFV     Full                                                              +--------+---------------+---------+-----------+----------+--------------------+ POP     Full                                                              +--------+---------------+---------+-----------+----------+--------------------+ PTV     Full           Yes      Yes                                       +--------+---------------+---------+-----------+----------+--------------------+ PERO    Full           Yes      Yes                                       +--------+---------------+---------+-----------+----------+--------------------+   +----+---------------+---------+-----------+----------+--------------+ LEFTCompressibilityPhasicitySpontaneityPropertiesThrombus Aging +----+---------------+---------+-----------+----------+--------------+ CFV Full           Yes      Yes                                 +----+---------------+---------+-----------+----------+--------------+     Summary: RIGHT: - There is no evidence of deep vein thrombosis in the lower extremity.  - No cystic structure found in the popliteal fossa.  LEFT: - No evidence of common femoral vein obstruction.  *See table(s)  above for measurements and observations. Electronically signed by Deitra Mayo MD on 09/07/2022 at 2:00:16 PM.    Final    Recent Labs    09/08/22 0742  WBC 5.0  HGB 8.9*  HCT 28.5*  PLT 122*   Recent Labs    09/08/22 0742  NA 140  K 4.9  CL 107  CO2 27  GLUCOSE 207*  BUN 43*  CREATININE 1.82*  CALCIUM 7.6*    Intake/Output Summary (Last 24 hours) at 09/08/2022 1004 Last data filed at 09/08/2022 0700 Gross per 24 hour  Intake 450 ml  Output 650 ml  Net -200 ml        Physical Exam: Vital Signs Blood pressure (!) 154/58, pulse 91, temperature 98 F (36.7 C), resp. rate 15, weight 78.7 kg, SpO2 98 %. General:No apparent distress, BMI 31.73 HEENT: Head is normocephalic, atraumatic, PERRLA, EOMI, sclera anicteric, oral mucosa pink and moist Neck: Supple without JVD or lymphadenopathy Heart: IIR. No murmurs rubs or gallops. 3+ edema RLE, 2+ LLE.  Chest: Decreased breath sounds b/l bases,  no distress. Port-A-Cath in place  R chest  Abdomen: Soft, non-tender, non-distended, bowel sounds positive. Extremities: No clubbing, cyanosis. 2+ b/l LE edema. Pulses are 2+ Psych: Pt's affect is a little flat. Pt is cooperative Neuro:  Alert and oriented x4,follows commands, CN 2-12 intact  PE from prior encounters: Skin: Clean and intact without signs of breakdown R hip with 2 border foam dressings- CDI Sensation decreased to LT in b/l LE in stocking glove distribution, normal insight and judgement Strength 5/5 in b/l UE Strength 5/5 in LLE Strength 1-2/5 hip flexion and knee extension, 4/5 ankle PF and DF  FTN intact b/l Musculoskeletal:  Normal bulk and tone.  R hip appropriately tender, ecchymosis along R thigh  Assessment/Plan: 1. Functional deficits which require 3+ hours per day of interdisciplinary therapy in a comprehensive inpatient rehab setting. Physiatrist is providing close team supervision and 24 hour management of active medical problems listed  below. Physiatrist and rehab team continue to assess barriers to discharge/monitor patient progress toward functional and medical goals  Care Tool:  Bathing    Body parts bathed by patient: Right arm, Left arm, Chest, Abdomen, Face   Body parts bathed by helper: Front perineal area, Buttocks, Right upper leg, Left upper leg, Right lower leg, Left lower leg     Bathing assist Assist Level: Supervision/Verbal cueing     Upper Body Dressing/Undressing Upper body dressing   What is the patient wearing?: Pull over shirt    Upper body assist Assist Level: Minimal Assistance - Patient > 75%    Lower Body Dressing/Undressing Lower body dressing      What is the patient wearing?: Pants, Incontinence brief     Lower body assist Assist for lower body dressing: Total Assistance - Patient < 25%     Toileting Toileting    Toileting assist Assist for toileting: Maximal Assistance - Patient 25 - 49% (stedy)     Transfers Chair/bed transfer  Transfers assist     Chair/bed transfer assist level: Maximal Assistance - Patient 25 - 49%     Locomotion Ambulation   Ambulation assist      Assist level: Minimal Assistance - Patient > 75% Assistive device: Walker-Eva Max distance: 65f   Walk 10 feet activity   Assist  Walk 10 feet activity did not occur: Safety/medical concerns        Walk 50 feet activity   Assist Walk 50 feet with 2 turns activity did not occur: Safety/medical concerns         Walk 150 feet activity   Assist Walk 150 feet activity did not occur: Safety/medical concerns         Walk 10 feet on uneven surface  activity   Assist Walk 10 feet on uneven surfaces activity did not occur: Safety/medical concerns         Wheelchair     Assist Is the patient using a wheelchair?: Yes Type of Wheelchair: Manual    Wheelchair assist level: Dependent - Patient 0% Max wheelchair distance: >1539f  Wheelchair 50 feet with 2 turns  activity    Assist        Assist Level: Dependent - Patient 0%   Wheelchair 150 feet activity     Assist      Assist Level: Dependent - Patient 0%   Blood pressure (!) 154/58, pulse 91, temperature 98 F (36.7 C), resp. rate 15, weight 78.7 kg, SpO2 98 %.  Medical Problem List and Plan: 1. Functional deficits secondary to right intertrochanteric hip fracture after mechanical fall.  Status post intramedullary nailing 08/30/2022.  Weightbearing as tolerated, no ROM restrictions              -patient may shower, soap and water only             -ELOS/Goals: 14 days             -Sutures to be removed around 09/12/22             -Daily dressing changes, can leave open to air if no drainage  Continue CIR 2.  Antithrombotics: -DVT/anticoagulation:  Pharmaceutical: Eliquis             -antiplatelet therapy: Plavix 75 mg daily             - RLE duplex ordered 9/30 for pain, edema post-op - negative 3. Pain Management: Robaxin and oxycodone as needed. Ordered ice to hip three times per day for 15 minutes each.  4. Anxiety: continue Ativan 0.5 mg every 6 hours as needed anxiety.  Provide emotional support             -antipsychotic agents: N/A 5. Neuropsych/cognition: This patient is capable of making decisions on her own behalf. 6. Skin/Wound Care: Routine skin checks 7. Fluids/Electrolytes/Nutrition: Routine in and outs with follow-up chemistries 8.  Acute blood loss anemia.  -HGB 9.5 9/29, recheck Monday 9.  AKI on CKD stage III.  Baseline creatinine 1.5-1.9.  Latest follow-up creatinine 1.80.  Follow-up renal services as needed.  Renal ultrasound negative 10.  Diabetes mellitus.  Semglee 6 units nightly.  Sliding scale insulin. Check blood sugars before meals and at bedtime. Monitor closely as PO intake improves. Recommended avoiding added sugars and minimizing bread, pasta, and rice in her diet.  Recent Labs    09/07/22 1627 09/07/22 2056 09/08/22 0540  GLUCAP 134* 188* 125*      11.  History of seizure disorder.  Keppra 500 mg twice daily 12.Myelogenous leukemia.  In remission.  Follow-up outpatient with Dr. [name] 13.  Persistent atrial fibrillation.  Continue Eliquis.  Cardiac rate controlled 14.  CAD with stenting.  Continue Plavix.  No chest pain or shortness of breath 15.  Chronic diastolic congestive heart failure.  Echocardiogram 04/17/2022 ejection fraction 60 to 65%.  LOSARTAN and JARDIANCE currently on hold.   Resume as needed. On home torsemide '40mg'$  daily             - Restart torsemide '20mg'$               - weight increased, restart Jeral Pinch Weights   09/07/22 0517 09/08/22 0535  Weight: 77.3 kg 78.7 kg     16.  History of uterine breast cancer.  Follow-up outpatient. 17.  Hyperlipidemia.  Lipitor '40mg'$  daily 18.  Constipation.  Senokot twice daily, miralax daily             -LBM 9/29-improved  19.  COPD with remote history of tobacco use.  Duoneb PRN. Check oxygen saturations every shift          -  9/30 - PRN oxygen for says <95%  20.  Acute urinary retention.  Foley catheter tube 9/25.  Continue Flomax.               -Consider voiding trial - DC foley trial 9/30 21. Indigestion             -Maalox prn 22. Low Vitamin D             -Ergocalciferol 50,000 units every 7 days for 7 doses. 23. Pancreas head mass. follows with Dr. Ardis Hughs 24. Obesity BMI 31.73: provided dietary eduction.  25. 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       LOS: 3 days A FACE TO FACE EVALUATION WAS PERFORMED  Aurorah Schlachter P Conna Terada 09/08/2022, 10:04 AM

## 2022-09-08 NOTE — IPOC Note (Signed)
Overall Plan of Care [facility]) Patient Details Name: [name] MRN: [id number] DOB: [birth year]  Admitting Diagnosis: Intertrochanteric fracture of right hip [facility])  Hospital Problems: Principal Problem:   Intertrochanteric fracture of right hip [facility])     Functional Problem List: Nursing Bowel, Medication Management, Safety, Pain, Endurance  PT Balance, Safety, Skin Integrity, Edema, Endurance, Motor, Nutrition, Pain  OT Balance, Pain, Endurance  SLP    TR         Basic ADL's: OT Bathing, Dressing, Toileting     Advanced  ADL's: OT Simple Meal Preparation     Transfers: PT Bed Mobility, Bed to Chair, Car, Manufacturing systems engineer, Metallurgist: PT Ambulation, Stairs, Emergency planning/management officer     Additional Impairments: OT    SLP        TR      Anticipated Outcomes Item Anticipated Outcome  Self Feeding    Swallowing      Basic self-care  SBA  Toileting  SBA   Bathroom Transfers CGA  Bowel/Bladder  manage bowel w mod I assist  Transfers  CGA using LRAD  Locomotion  CGA using LRAD  Communication     Cognition     Pain  < 4 with prns  Safety/Judgment  manage w cues   Therapy Plan: PT Intensity: Minimum of 1-2 x/day ,45 to 90 minutes PT Frequency: 5 out of 7 days PT Duration Estimated Length of Stay: ~2-2.5 weeks OT Intensity: Minimum of 1-2 x/day, 45 to 90 minutes OT Frequency: 5 out of 7 days OT Duration/Estimated Length of Stay: 14     Team Interventions: Nursing Interventions Patient/Family Education, Bowel Management, Pain Management, Bladder Management, Medication Management, Disease Management/Prevention, Discharge Planning  PT interventions Ambulation/gait training, Community reintegration, DME/adaptive equipment instruction, Neuromuscular re-education, Psychosocial support, Stair training, UE/LE Strength taining/ROM, Wheelchair propulsion/positioning, Training and development officer, Discharge planning, Functional electrical  stimulation, Pain management, Skin care/wound management, Therapeutic Activities, UE/LE Coordination activities, Disease management/prevention, Functional mobility training, Patient/family education, Splinting/orthotics, Therapeutic Exercise, Visual/perceptual remediation/compensation  OT Interventions Balance/vestibular training, Discharge planning, Pain management, Self Care/advanced ADL retraining, Therapeutic Activities, Functional mobility training, Patient/family education, Therapeutic Exercise, UE/LE Strength taining/ROM, DME/adaptive equipment instruction, Community reintegration  SLP Interventions    TR Interventions    SW/CM Interventions Discharge Planning, Psychosocial Support, Patient/Family Education, Disease Management/Prevention   Barriers to Discharge MD  Medical stability  Nursing Decreased caregiver support, Home environment access/layout 1 level 1ste no rails w spouse; dtr to assist prn  PT Decreased caregiver support    OT      SLP      SW Lack of/limited family support, Insurance for SNF coverage, Decreased caregiver support, Home environment access/layout Rolling walker potentially unable to fit in home. Spouse only able to provide supervision   Team Discharge Planning: Destination: PT-Home ,OT- Home , SLP-  Projected Follow-up: PT-Home health PT, 24 hour supervision/assistance, OT-  Home health OT, SLP-  Projected Equipment Needs: PT-To be determined, OT- 3 in 1 bedside comode, Tub/shower bench, Rolling walker with 5" wheels, SLP-  Equipment Details: PT- , OT-  Patient/family involved in discharge planning: PT- Patient,  OT-Patient, SLP-   MD ELOS: 14 days Medical Rehab Prognosis:  Excellent Assessment: The patient has been admitted for CIR therapies with the diagnosis of right hip fracture. The team will be addressing functional mobility, strength, stamina, balance, safety, adaptive techniques and equipment, self-care, bowel and bladder mgt, patient and caregiver  education. Goals have been set at [place]. Anticipated discharge  destination is home.        See Team Conference Notes for weekly updates to the plan of care

## 2022-09-09 ENCOUNTER — Inpatient Hospital Stay (HOSPITAL_COMMUNITY): Payer: Worker's Compensation

## 2022-09-09 DIAGNOSIS — Z87828 Personal history of other (healed) physical injury and trauma: Secondary | ICD-10-CM | POA: Diagnosis not present

## 2022-09-09 DIAGNOSIS — M25551 Pain in right hip: Secondary | ICD-10-CM | POA: Diagnosis not present

## 2022-09-09 DIAGNOSIS — S72114A Nondisplaced fracture of greater trochanter of right femur, initial encounter for closed fracture: Secondary | ICD-10-CM | POA: Diagnosis not present

## 2022-09-09 DIAGNOSIS — S72144A Nondisplaced intertrochanteric fracture of right femur, initial encounter for closed fracture: Secondary | ICD-10-CM | POA: Diagnosis not present

## 2022-09-09 LAB — CBC WITH DIFFERENTIAL/PLATELET
Abs Immature Granulocytes: 0.01 10*3/uL (ref 0.00–0.07)
Basophils Absolute: 0 10*3/uL (ref 0.0–0.1)
Basophils Relative: 0 %
Eosinophils Absolute: 0.3 10*3/uL (ref 0.0–0.5)
Eosinophils Relative: 7 %
HCT: 25.4 % — ABNORMAL LOW (ref 36.0–46.0)
Hemoglobin: 7.8 g/dL — ABNORMAL LOW (ref 12.0–15.0)
Immature Granulocytes: 0 %
Lymphocytes Relative: 19 %
Lymphs Abs: 0.8 10*3/uL (ref 0.7–4.0)
MCH: 29.9 pg (ref 26.0–34.0)
MCHC: 30.7 g/dL (ref 30.0–36.0)
MCV: 97.3 fL (ref 80.0–100.0)
Monocytes Absolute: 0.5 10*3/uL (ref 0.1–1.0)
Monocytes Relative: 11 %
Neutro Abs: 2.7 10*3/uL (ref 1.7–7.7)
Neutrophils Relative %: 63 %
Platelets: 115 10*3/uL — ABNORMAL LOW (ref 150–400)
RBC: 2.61 MIL/uL — ABNORMAL LOW (ref 3.87–5.11)
RDW: 13.2 % (ref 11.5–15.5)
WBC: 4.3 10*3/uL (ref 4.0–10.5)
nRBC: 0 % (ref 0.0–0.2)

## 2022-09-09 LAB — GLUCOSE, CAPILLARY
Glucose-Capillary: 155 mg/dL — ABNORMAL HIGH (ref 70–99)
Glucose-Capillary: 106 mg/dL — ABNORMAL HIGH (ref 70–99)
Glucose-Capillary: 187 mg/dL — ABNORMAL HIGH (ref 70–99)
Glucose-Capillary: 153 mg/dL — ABNORMAL HIGH (ref 70–99)

## 2022-09-09 LAB — OCCULT BLOOD X 1 CARD TO LAB, STOOL: Fecal Occult Bld: NEGATIVE

## 2022-09-09 LAB — TYPE AND SCREEN
ABO/RH(D): O NEG
Antibody Screen: NEGATIVE

## 2022-09-09 MED ORDER — SODIUM CHLORIDE 0.9% IV SOLUTION: Freq: Once | INTRAVENOUS | Status: DC

## 2022-09-09 MED ORDER — MILK AND MOLASSES ENEMA
1.0000 | Freq: Once | RECTAL | Status: DC
  Filled 2022-09-09: qty 240

## 2022-09-09 MED ORDER — TORSEMIDE 20 MG PO TABS
40.0000 mg | ORAL_TABLET | Freq: Every day | ORAL | Status: DC
  Administered 2022-09-10 – 2022-09-11 (×2): 40 mg via ORAL
  Filled 2022-09-09 (×3): qty 2

## 2022-09-09 MED ORDER — APIXABAN 2.5 MG PO TABS
2.5000 mg | ORAL_TABLET | Freq: Two times a day (BID) | ORAL | Status: DC
  Administered 2022-09-09 – 2022-10-01 (×44): 2.5 mg via ORAL
  Filled 2022-09-09 (×44): qty 1

## 2022-09-09 MED ORDER — TORSEMIDE 20 MG PO TABS
20.0000 mg | ORAL_TABLET | Freq: Once | ORAL | Status: AC
  Administered 2022-09-09: 20 mg via ORAL
  Filled 2022-09-09: qty 1

## 2022-09-09 NOTE — Progress Notes (Signed)
Physical Therapy Session Note  Patient Details  Name: [name] MRN: [id number] Date of Birth: 01/11/1941  Today's Date: 09/09/2022 PT Individual Time: 1660-6004 PT Individual Time Calculation (min): 44 min   Short Term Goals: Week 1:  PT Short Term Goal 1 (Week 1): Pt will perform supine<>sit with min assist consistently PT Short Term Goal 2 (Week 1): Pt will perform sit<>stands using LRAD with min assist PT Short Term Goal 3 (Week 1): Pt will perform bed<>chair transfers using LRAD with min assist PT Short Term Goal 4 (Week 1): Pt will ambulate at least 67ft using LRAD with min assist (+2 for safety as needed)  Skilled Therapeutic Interventions/Progress Updates:      Pt sitting in w/c to start - agreeable to PT tx. Reports unrated R hip pain - mobility and repositioning provided for pain management. Pt with HbG of 7.8 per chart review. MD is aware and is present at start of session to discuss hip XR later today to determine if possible hematoma is causing downtrend in HbG.  Donned knee-high compression socks with dependant assist - difficult due to her edema in BLE and pain in RLE.   Pt reporting need to use bathroom prior to assisting her with LB dressing. Wheeled in bathroom with BSC over toilet. Stand<>pivot transfer with modA from w/c to [facility] with patient using grab bars in bathroom for UE support. Pain limiting her transfer and safety awareness. Pt quite fearful of falling during transfer and benefits from cues for calming.   Pt continent of bladder only. Charted in flowsheets.  Returned to w/c via Charlaine Dalton for time management. Able to rise to stand in [place] with minA and can stay standing in [place] with CGA - cues for upright and increasing hip extension while standing. Donned briefs and pants with dependant assist while using the Stedy. Completed x4 stands total during LB dressing and toileting with improved ability to accept weight through RLE each stand.  Focused remainder of  session on w/c mobility for endurance and cardiovascular training.  Pt propelled herself ~40ft with x2 180deg turns with supervision using BUE to propel. Cues for increasing B shldr extension during reaching to improve efficiency. Educated on equal push's to promote straight path and she tended to drift L.   Pt ended session in w/c with all needs met, informed of upcoming therapy schedule.   Therapy Documentation Precautions:  Precautions Precautions: Fall, Other (comment) Precaution Comments: monitor SpO2 to maintain >92% (up to 2L of O2 via nasal cannula as needed), hx of seizure disorder, no R LE ROM restrictions Restrictions Weight Bearing Restrictions: Yes RLE Weight Bearing: Weight bearing as tolerated General:     Therapy/Group: Individual Therapy  Alger Simons 09/09/2022, 7:51 AM

## 2022-09-09 NOTE — Progress Notes (Signed)
Occupational Therapy Session Note  Patient Details  Name: [name] MRN: [id number] Date of Birth: 1941-01-14  Today's Date: 09/09/2022 OT Individual Time: 1027-2536 OT Individual Time Calculation (min): 72 min    Short Term Goals: Week 1:  OT Short Term Goal 1 (Week 1): Patient to perform transfer to [facility] with Mod A using LRAD OT Short Term Goal 2 (Week 1): Patient to perform LE dressing with AE as needed OT Short Term Goal 3 (Week 1): Patient to perform bathing with Mod A and AE as needed OT Short Term Goal 4 (Week 1): Patient to perform shower transfer with Mod A and LRAD  Skilled Therapeutic Interventions/Progress Updates:  Skilled OT intervention completed with focus on toileting needs, d/c planning, functional transfers and activity tolerance to promote independence in BADLs.   Pt received seated in recliner, reported un-rated pain at rest, and increase in pain/soreness with movement. Per review, pt was due for pain meds, however reported wanting to try without. Therapist provided education on pre-medication technique for preventing pain with therapy demands. Wrote down when due for both sets of meds on her whiteboard for visual aid and to promote independence with managing pain med regimen.   Discussed rehab goals and pt's assist available which is very limited due to her disabled husband, therefore educated pt on potential hired caregiver as she will likely need some assist that her husband can't provide upon d/c unless she progresses to a mod I level. Also discussed pt's shower set up with recommendation to try tub/shower transfer in prep for shower tasks at home.  Pt requested to try and void. Completed min A sit > stand with one UE on platform of RW and pushing up with R hand from arm rest of recliner. Attempted short ambulatory transfer however due to fatigue and lack of RLE clearance, transitioned to stand pivot with bilateral PFRW to [facility] with physical assist needed for  advancing R foot for transfer. Transferred at same assist rest of session.  With extended time, pt was only continent of very small BM- charted. Required BLE elevated on BBSC to minimize pressure under R femur. Mod A sit > stand then stand pivot to w/c.  Therapist advised pt to trial pushing up with LUE vs RUE with increased efficiency and balance demonstrated with transferring arm to the platform with only CGA needed vs min A. Also had pt utilized a "command" method for when she needed assist for advancing RLE vs therapist just jumping in, to prep for independence with pre-gait skills. Able to complete stand pivot to EOB per nursing request for scheduled x-ray.  Once EOB, therapist doffed grip sock on RLE to decrease friction, and had pt complete 2x10 heel slides for edema management and in prep for foot clearance during transfers. Pt was able to position BLE in hook method in prep for bed mobility with supervision, then required max A for BLE management to supine in bed. Pt still demo's challenge with side lying method and is unable to do without significant increase in pain.  Pt remained upright in bed, with heating pad applied to thoracic region of back for pain reduction, nursing notified of pain meds request, BLE elevated, bed alarm on and all needs in reach at end of session.  O2 trends during session: Seated in recliner, 1L via [place], 93% SPO2 After stand pivot to BSC, 98% Remained on 1L for rest of/at conclusion of session with SPO2 WNLs    Therapy Documentation Precautions:  Precautions  Precautions: Fall, Other (comment) Precaution Comments: monitor SpO2 to maintain >92% (up to 2L of O2 via nasal cannula as needed), hx of seizure disorder, no R LE ROM restrictions Restrictions Weight Bearing Restrictions: Yes RLE Weight Bearing: Weight bearing as tolerated    Therapy/Group: Individual Therapy  Blase Mess, MS, OTR/L  09/09/2022, 12:10 PM

## 2022-09-09 NOTE — Progress Notes (Signed)
Received a call from [place], reporting [name] HGB 7.8. Yesterday HGB was 8.9.  Note was reviewed,  Ask the nurse to place a order for T&C,. The above was discussed with Catherine Mesi PA.

## 2022-09-09 NOTE — Progress Notes (Signed)
PROGRESS NOTE   Subjective/Complaints: Discussed her drop in hemoglobin, getting XR to assess for hematoma, will hold Eliquis until result, discussed with cardiology. Repeat CBC tomorrow.  She is feeling well otherwise.   ROS: Denies fevers, chills, N/V, abdominal pain, diarrhea, SOB, cough, chest pain, new weakness or paraesthesias.  +hip pain well controlled with pain medications  Objective:   No results found. Recent Labs    09/08/22 0742 09/09/22 0308  WBC 5.0 4.3  HGB 8.9* 7.8*  HCT 28.5* 25.4*  PLT 122* 115*   Recent Labs    09/08/22 0742  NA 140  K 4.9  CL 107  CO2 27  GLUCOSE 207*  BUN 43*  CREATININE 1.82*  CALCIUM 7.6*    Intake/Output Summary (Last 24 hours) at 09/09/2022 1021 Last data filed at 09/09/2022 0735 Gross per 24 hour  Intake 586 ml  Output --  Net 586 ml        Physical Exam: Vital Signs Blood pressure (!) 140/50, pulse 68, temperature 97.8 F (36.6 C), resp. rate 18, weight 79.6 kg, SpO2 95 %. General:No apparent distress, BMI 31.73 HEENT: Head is normocephalic, atraumatic, PERRLA, EOMI, sclera anicteric, oral mucosa pink and moist Neck: Supple without JVD or lymphadenopathy Heart: IIR. No murmurs rubs or gallops. 3+ edema RLE, 2+ LLE.  Chest: Decreased breath sounds b/l bases,  no distress. Port-A-Cath in place  R chest , satting well with ambulation Abdomen: Soft, non-tender, non-distended, bowel sounds positive. Extremities: No clubbing, cyanosis. 2+ b/l LE edema. Pulses are 2+ Psych: Pt's affect is a little flat. Pt is cooperative Neuro:  Alert and oriented x4,follows commands, CN 2-12 intact  PE from prior encounters: Skin: Clean and intact without signs of breakdown R hip with 2 border foam dressings- CDI Sensation decreased to LT in b/l LE in stocking glove distribution, normal insight and judgement Strength 5/5 in b/l UE Strength 5/5 in LLE Strength 1-2/5 hip flexion  and knee extension, 4/5 ankle PF and DF  FTN intact b/l Musculoskeletal:  Normal bulk and tone.  R hip appropriately tender, ecchymosis along R thigh  Assessment/Plan: 1. Functional deficits which require 3+ hours per day of interdisciplinary therapy in a comprehensive inpatient rehab setting. Physiatrist is providing close team supervision and 24 hour management of active medical problems listed below. Physiatrist and rehab team continue to assess barriers to discharge/monitor patient progress toward functional and medical goals  Care Tool:  Bathing    Body parts bathed by patient: Right arm, Left arm, Chest, Abdomen, Face   Body parts bathed by helper: Front perineal area, Buttocks, Right upper leg, Left upper leg, Right lower leg, Left lower leg     Bathing assist Assist Level: Supervision/Verbal cueing     Upper Body Dressing/Undressing Upper body dressing   What is the patient wearing?: Pull over shirt    Upper body assist Assist Level: Minimal Assistance - Patient > 75%    Lower Body Dressing/Undressing Lower body dressing      What is the patient wearing?: Pants, Incontinence brief     Lower body assist Assist for lower body dressing: Total Assistance - Patient < 25%     Toileting  Toileting    Toileting assist Assist for toileting: Maximal Assistance - Patient 25 - 49%     Transfers Chair/bed transfer  Transfers assist     Chair/bed transfer assist level: Maximal Assistance - Patient 25 - 49%     Locomotion Ambulation   Ambulation assist      Assist level: Minimal Assistance - Patient > 75% Assistive device: Ethelene Hal Max distance: 61f   Walk 10 feet activity   Assist  Walk 10 feet activity did not occur: Safety/medical concerns        Walk 50 feet activity   Assist Walk 50 feet with 2 turns activity did not occur: Safety/medical concerns         Walk 150 feet activity   Assist Walk 150 feet activity did not occur:  Safety/medical concerns         Walk 10 feet on uneven surface  activity   Assist Walk 10 feet on uneven surfaces activity did not occur: Safety/medical concerns         Wheelchair     Assist Is the patient using a wheelchair?: Yes Type of Wheelchair: Manual    Wheelchair assist level: Dependent - Patient 0% Max wheelchair distance: >156f   Wheelchair 50 feet with 2 turns activity    Assist        Assist Level: Dependent - Patient 0%   Wheelchair 150 feet activity     Assist      Assist Level: Dependent - Patient 0%   Blood pressure (!) 140/50, pulse 68, temperature 97.8 F (36.6 C), resp. rate 18, weight 79.6 kg, SpO2 95 %.  Medical Problem List and Plan: 1. Functional deficits secondary to right intertrochanteric hip fracture after mechanical fall.  Status post intramedullary nailing 08/30/2022.  Weightbearing as tolerated, no ROM restrictions              -patient may shower, soap and water only             -ELOS/Goals: 14 days             -Sutures to be removed around 09/12/22             -Daily dressing changes, can leave open to air if no drainage  Continue CIR 2.  Antithrombotics: -DVT/anticoagulation:  Pharmaceutical: Eliquis             -antiplatelet therapy: Plavix 75 mg daily             - RLE duplex ordered 9/30 for pain, edema post-op - negative 3. Pain Management: Robaxin and oxycodone as needed. Ordered ice to hip three times per day for 15 minutes each.  4. Anxiety: continue Ativan 0.5 mg every 6 hours as needed anxiety.  Provide emotional support             -antipsychotic agents: N/A 5. Neuropsych/cognition: This patient is capable of making decisions on her own behalf. 6. Skin/Wound Care: Routine skin checks 7. Fluids/Electrolytes/Nutrition: Routine in and outs with follow-up chemistries 8.  Acute blood loss anemia. Drop in hgb to 7.8 on 10/3, repeat tomorrow.  9.  AKI on CKD stage III.  Baseline creatinine 1.5-1.9.  Latest  follow-up creatinine 1.80.  Follow-up renal services as needed.  Renal ultrasound negative 10.  Diabetes mellitus.  Semglee 6 units nightly.  Sliding scale insulin. Check blood sugars before meals and at bedtime. Monitor closely as PO intake improves. Recommended avoiding added sugars and minimizing bread, pasta, and rice in  her diet.  Recent Labs    09/08/22 1613 09/08/22 2114 09/09/22 0632  GLUCAP 161* 171* 155*     11.  History of seizure disorder.  Keppra 500 mg twice daily 12.Myelogenous leukemia.  In remission.  Follow-up outpatient with Dr. [name] 13.  Persistent atrial fibrillation. Discussed with cardiology holding low dose eliquis until XR to assess for hematoma.  Cardiac rate controlled 14.  CAD with stenting.  Continue Plavix.  No chest pain or shortness of breath 15.  Chronic diastolic congestive heart failure.  Echocardiogram 04/17/2022 ejection fraction 60 to 65%.  LOSARTAN and JARDIANCE currently on hold.   Resume as needed. On home torsemide '40mg'$  daily             - Restart torsemide '20mg'$               - weight increased, increase toresemide to '40mg'$  daily Filed Weights   09/07/22 0517 09/08/22 0535 09/09/22 0600  Weight: 77.3 kg 78.7 kg 79.6 kg     16.  History of uterine breast cancer.  Follow-up outpatient. 17.  Hyperlipidemia.  Lipitor '40mg'$  daily 18.  Constipation.  Senokot twice daily, miralax daily             -LBM 9/29-improved  19.  COPD with remote history of tobacco use.  Duoneb PRN. Check oxygen saturations every shift          - 9/30 - PRN oxygen for says <95%  20.  Acute urinary retention.  Foley catheter tube 9/25.  Continue Flomax.               -Consider voiding trial - DC foley trial 9/30 21. Indigestion             -Maalox prn 22. Low Vitamin D             -Ergocalciferol 50,000 units every 7 days for 7 doses. 23. Pancreas head mass. follows with Dr. Ardis Hughs 24. Obesity BMI 31.73-->32.09: provided dietary eduction. Increase torsemide given  increase in weight.  25. 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       LOS: 4 days A FACE TO FACE EVALUATION WAS PERFORMED  Keamber Macfadden P Aydrian Halpin 09/09/2022, 10:21 AM

## 2022-09-09 NOTE — Progress Notes (Signed)
Physical Therapy Session Note  Patient Details  Name: [name] MRN: [id number] Date of Birth: 30-Jun-1941  Today's Date: 09/09/2022 PT Individual Time: 7711-6579 PT Individual Time Calculation (min): 44 min   Short Term Goals: Week 1:  PT Short Term Goal 1 (Week 1): Pt will perform supine<>sit with min assist consistently PT Short Term Goal 2 (Week 1): Pt will perform sit<>stands using LRAD with min assist PT Short Term Goal 3 (Week 1): Pt will perform bed<>chair transfers using LRAD with min assist PT Short Term Goal 4 (Week 1): Pt will ambulate at least 13f using LRAD with min assist (+2 for safety as needed)  Skilled Therapeutic Interventions/Progress Updates:    Pt received sitting EOB eating lunch stating she just got back from x-ray. Pt agreeable to therapy session. Pt received and maintained on 1L of O2 via nasal cannula during session.   Discussed her CLOF and use of B PFRW - pt states it feels like the platforms are too wide, unfortunately there is no way to make them closer without having the ends of the metal poles near her feet, which would likely be unsafe.  Pt reports urgent need to use bathroom.  Sit>stand EOB>B PFRW - pt recalls learning to push up with L hand from EOB for increased ability to rise and steady herself, as learned with OT, upon question cuing - CGA for steadying while rising, pt is slow to come up, and min assist to stabilize AD.  R stand pivot EOB>bari-BSC using B PFRW with CGA for steadying and 2x min assist to step R LE laterally during the transfer and then pt shuffle pivots on her L LE.   Standing with CGA and B UE support on B PFRW during dependent assist LB clothing management. Continent of BM - reports feeling constipated, nurse and MD notified.   L stand pivot bari-BSC>EOB using B PFRW with CGA for steadying and pt demos improved ability to step R LE backwards and in compared to forward and out when transferring in the opposite  direction.  Sit>supine using bedrail with min assist for R LE management onto bed and pt using compensatory strategy of placing her R LE onto her L LE to assist with lifting it.   Pt left supine in bed with needs in reach, bed alarm on, and lines intact.    Therapy Documentation Precautions:  Precautions Precautions: Fall, Other (comment) Precaution Comments: monitor SpO2 to maintain >92% (up to 2L of O2 via nasal cannula as needed), hx of seizure disorder, no R LE ROM restrictions Restrictions Weight Bearing Restrictions: Yes RLE Weight Bearing: Weight bearing as tolerated   Pain: Unrated R LE pain during mobility - provided rest and repositioning with no additional interventions requested at this time.   Therapy/Group: Individual Therapy  CTawana Scale, PT, DPT, NCS, CSRS 09/09/2022, 12:18 PM

## 2022-09-09 NOTE — Progress Notes (Addendum)
Occupational Therapy Session Note  Patient Details  Name: [name] MRN: [id number] Date of Birth: 05/23/1941  Today's Date: 09/09/2022 OT Individual Time: 0413-6438 OT Individual Time Calculation (min): 72 min    Short Term Goals: Week 1:  OT Short Term Goal 1 (Week 1): Patient to perform transfer to [facility] with Mod A using LRAD OT Short Term Goal 2 (Week 1): Patient to perform LE dressing with AE as needed OT Short Term Goal 3 (Week 1): Patient to perform bathing with Mod A and AE as needed OT Short Term Goal 4 (Week 1): Patient to perform shower transfer with Mod A and LRAD Week 2:     Skilled Therapeutic Interventions/Progress Updates:    1:1 Pt received in the w/c and asked for meds. Pt came to edge of w/c to perform right LE exercises. Pt's with increased swelling in bilateral LEs. Maxi Slide place under right foot; performed heel slides to promote knee extension and calf stretches; pt able to perform 20 reps. With assistance against gravity performed seated hip flexion (marching ) on the right 15 reps. These exercises to help be able to maneuver LEs better for increased ADL performance. After rest break- taken in the bathroom and focus on transfers to the [facility] over the commode with platform RW- taking a few steps with min to mod A. Pt able to come up into standing with less assistance when pushing up from the w/c with one UE and the other one on the walker. With cues and min A able to steer RW during the transfer. Performed sit to stand from [facility] in the same manner. And transferred into the recliner again with min to mod A. Left sitting up in the recliner with LEs elevated and ice place on right hip with call bell in hand.   Therapy Documentation Precautions:  Precautions Precautions: Fall, Other (comment) Precaution Comments: monitor SpO2 to maintain >92% (up to 2L of O2 via nasal cannula as needed), hx of seizure disorder, no R LE ROM restrictions Restrictions Weight Bearing  Restrictions: Yes RLE Weight Bearing: Weight bearing as tolerated  Pain: Pt reports pain is high and was asking for meds - RN brought mm relaxer and med. Allowed for rest breaks prn      Therapy/Group: Individual Therapy  Willeen Cass [name] 09/09/2022, 12:00 PM

## 2022-09-09 NOTE — Plan of Care (Signed)
  Problem: RH SKIN INTEGRITY Goal: RH STG ABLE TO PERFORM INCISION/WOUND CARE W/ASSISTANCE Description: STG Able To Perform Incision/Wound Care With min  Assistance. Outcome: Not Progressing;rashes noted underneath breast ; interdry placed per order

## 2022-09-10 DIAGNOSIS — S72144A Nondisplaced intertrochanteric fracture of right femur, initial encounter for closed fracture: Secondary | ICD-10-CM | POA: Diagnosis not present

## 2022-09-10 LAB — GLUCOSE, CAPILLARY
Glucose-Capillary: 109 mg/dL — ABNORMAL HIGH (ref 70–99)
Glucose-Capillary: 150 mg/dL — ABNORMAL HIGH (ref 70–99)
Glucose-Capillary: 171 mg/dL — ABNORMAL HIGH (ref 70–99)
Glucose-Capillary: 184 mg/dL — ABNORMAL HIGH (ref 70–99)

## 2022-09-10 LAB — CBC
HCT: 25.2 % — ABNORMAL LOW (ref 36.0–46.0)
Hemoglobin: 7.8 g/dL — ABNORMAL LOW (ref 12.0–15.0)
MCH: 29.8 pg (ref 26.0–34.0)
MCHC: 31 g/dL (ref 30.0–36.0)
MCV: 96.2 fL (ref 80.0–100.0)
Platelets: 124 10*3/uL — ABNORMAL LOW (ref 150–400)
RBC: 2.62 MIL/uL — ABNORMAL LOW (ref 3.87–5.11)
RDW: 13.3 % (ref 11.5–15.5)
WBC: 4.3 10*3/uL (ref 4.0–10.5)
nRBC: 0 % (ref 0.0–0.2)

## 2022-09-10 NOTE — Progress Notes (Addendum)
Patient ID: [name], female   DOB: 02-03-1941, 81 y.o.   MRN: [id number] Team Conference Report to Patient/Family  Team Conference discussion was reviewed with the patient and caregiver, including goals, any changes in plan of care and target discharge date.  Patient and caregiver express understanding and are in agreement.  The patient has a target discharge date of 09/24/22.  SW spoke with patient and provided team conference updates. Patient pleased with updates, no additional questions or concerns.   Dyanne Iha 09/10/2022, 2:11 PM

## 2022-09-10 NOTE — Progress Notes (Signed)
Physical Therapy Session Note  Patient Details  Name: [name] MRN: [id number] Date of Birth: 1941/07/16  Today's Date: 09/10/2022 PT Individual Time: 0831-0926 PT Individual Time Calculation (min): 55 min   Short Term Goals: Week 1:  PT Short Term Goal 1 (Week 1): Pt will perform supine<>sit with min assist consistently PT Short Term Goal 2 (Week 1): Pt will perform sit<>stands using LRAD with min assist PT Short Term Goal 3 (Week 1): Pt will perform bed<>chair transfers using LRAD with min assist PT Short Term Goal 4 (Week 1): Pt will ambulate at least 75f using LRAD with min assist (+2 for safety as needed)  Skilled Therapeutic Interventions/Progress Updates:   Received pt semi-reclined in bed, pt agreeable to PT treatment, and reported pain 8/10 in R hip - RN notified and present to administer medication and repositioning, rest breaks, and distraction provided to reduce pain levels. Session with emphasis on functional mobility/transfers, generalized strengthening and endurance, dynamic standing balance, and ambulation. Donned ted hose in supine with total A and pt performed the following exercises to warm up with emphasis on LE strength/ROM: -heel slides 2x12 on LLE and 2x5 AAROM on RLE -ankle circles x20 clockwise/counterclockwise bilaterally -hip abduction 2x12 on LLE and 2x8 AAROM on RLE  -hip adduction pillow squeezes 10x5 second isometric hold Of note, pt SOB with bed level exercises and required rest breaks in between exercises. Pt rolled L/R with supervision and use of bedrails to don clean brief with max A and non-skid socks with max A. Pt transferred semi-reclined<>sitting EOB with HOB elevated and mod A for RLE management. Stood from elevated EOB with bilateral PFRW and mod A and required max A to pull pants over hips. Started walking towards door, however due to pain, pt only able to ambulate 334fwith PFRW and min A [facility]  advance RLE towards WC due to pain. Placed shoe cover on RLE to assist in sliding foot forward, however pt reported this did not help. Concluded session with pt sitting in WC, needs within reach, and seatbelt alarm on.   SPO2 At rest on 1L: 93% During exercise on 1L: 89% increasing to 94% At end of session on 1L: 95%  Therapy Documentation Precautions:  Precautions Precautions: Fall, Other (comment) Precaution Comments: monitor SpO2 to maintain >92% (up to 2L of O2 via nasal cannula as needed), hx of seizure disorder, no R LE ROM restrictions Restrictions Weight Bearing Restrictions: Yes RLE Weight Bearing: Weight bearing as tolerated  Therapy/Group: Individual Therapy AnAlfonse AlpersT, DPT  09/10/2022, 7:00 AM

## 2022-09-10 NOTE — Progress Notes (Signed)
Physical Therapy Session Note  Patient Details  Name: [name] MRN: [id number] Date of Birth: 13-May-1941  Today's Date: 09/10/2022 PT Individual Time: 1312-1335 PT Individual Time Calculation (min): 23 min   Short Term Goals: Week 1:  PT Short Term Goal 1 (Week 1): Pt will perform supine<>sit with min assist consistently PT Short Term Goal 2 (Week 1): Pt will perform sit<>stands using LRAD with min assist PT Short Term Goal 3 (Week 1): Pt will perform bed<>chair transfers using LRAD with min assist PT Short Term Goal 4 (Week 1): Pt will ambulate at least 26f using LRAD with min assist (+2 for safety as needed)  Skilled Therapeutic Interventions/[facility] Updates: [facility]. PTA returned a few min later to assist with toileting and return to w/c. Pt noted to be on RA but no SOB nor in acute distress Upon return to room PTA performed Sit to stand with modA with facilitation at hips for anterior weight shifting. Pt noted to have platform on R only at this time. NT present to help with peri-care and pt required modA for LB clothing management. Pt then sat at w/c and discussed wether to return to bed to rest for next OT session or stay up. SpO2 checked prior to leaving room 97% on RA. Pt eventually agreeable to remain in w/c. Pt transported to day room and participated in Cybex Kinetron 80cm/sec 2 x 15 with rest break between bouts. Pt noted initially to keep in very small range but was able to increase RLE range significantly on second bout. SpO2 checked between bouts with pt maintaining 96-97% on RA. Pt transported back to room at end of session and remained in w/c with belt alarm on, call bell within reach and needs met.      Therapy Documentation Precautions:  Precautions Precautions: Fall, Other (comment) Precaution Comments: monitor SpO2 to maintain >92% (up to 2L of O2 via nasal cannula as needed), hx of seizure disorder, no R LE ROM  restrictions Restrictions Weight Bearing Restrictions: Yes RLE Weight Bearing: Weight bearing as tolerated General:   Vital Signs: Therapy Vitals Temp: 98.1 F (36.7 C) Pulse Rate: 64 Resp: 20 BP: (!) 139/49 Patient Position (if appropriate): Sitting Oxygen Therapy SpO2: 98 % O2 Device: Room Air Pain:   Mobility:   Locomotion :    Trunk/Postural Assessment :    Balance:   Exercises:   Other Treatments:      Therapy/Group: Individual Therapy  [name] 09/10/2022, 4:12 PM

## 2022-09-10 NOTE — Progress Notes (Signed)
PROGRESS NOTE   Subjective/Complaints:   Pt asking about xray results as well as Hb-  Hb is stable at 7.8- although had dropped between 10/2 to 10/3 hasn't dropped more.    Still has pain; muscle spasms bothered her overnight- given muscle relaxant- helped.     ROS:   Pt denies SOB, abd pain, CP, N/V/C/D, and vision changes  Objective:   DG HIP UNILAT WITH PELVIS 2-3 VIEWS RIGHT  Result Date: 09/09/2022 CLINICAL DATA:  Hematoma, pain and RIGHT hip. Limited exam due to patient discomfort. EXAM: DG HIP (WITH OR WITHOUT PELVIS) 2-3V RIGHT COMPARISON:  August 30, 2022 FINDINGS: Diffuse demineralization of visualized bones. Post ORIF of the RIGHT hip, nondisplaced intratrochanteric fracture that was present on previous imaging. Intramedullary nailing and dynamic hip screw. No visible signs of fracture. No gross soft tissue swelling. No signs of new fracture. Spinal degenerative changes. Stool throughout the colon as an incidental finding. IMPRESSION: Post ORIF of the RIGHT hip, nondisplaced intratrochanteric fracture that was present on previous imaging. No signs of new fracture. Electronically Signed   By: Zetta Bills M.D.   On: 09/09/2022 12:55   Recent Labs    09/09/22 0308 09/10/22 0410  WBC 4.3 4.3  HGB 7.8* 7.8*  HCT 25.4* 25.2*  PLT 115* 124*   Recent Labs    09/08/22 0742  NA 140  K 4.9  CL 107  CO2 27  GLUCOSE 207*  BUN 43*  CREATININE 1.82*  CALCIUM 7.6*    Intake/Output Summary (Last 24 hours) at 09/10/2022 0903 Last data filed at 09/10/2022 0729 Gross per 24 hour  Intake 832 ml  Output --  Net 832 ml        Physical Exam: Vital Signs Blood pressure (!) 141/43, pulse 78, temperature 98.4 F (36.9 C), resp. rate 16, weight 80.6 kg, SpO2 93 %.    General: awake, alert, appropriate, BMI 32; sitting up in bed; watching TV; NAD HENT: conjugate gaze; oropharynx moist CV: regular rate; no  JVD Pulmonary: CTA B/L; no W/R/R- good air movement GI: soft, NT, ND, (+)BS Psychiatric: appropriate- interactive Neurological: Ox3  clubbing, cyanosis. 2+ b/l LE edema. Pulses are 2+ Psych: Pt's affect is a little flat. Pt is cooperative Neuro:  Alert and oriented x4,follows commands, CN 2-12 intact  PE from prior encounters: Skin: Clean and intact without signs of breakdown R hip with 2 border foam dressings- CDI Sensation decreased to LT in b/l LE in stocking glove distribution, normal insight and judgement Strength 5/5 in b/l UE Strength 5/5 in LLE Strength 1-2/5 hip flexion and knee extension, 4/5 ankle PF and DF  FTN intact b/l Musculoskeletal:  Normal bulk and tone.  R hip appropriately tender, ecchymosis along R thigh  Assessment/Plan: 1. Functional deficits which require 3+ hours per day of interdisciplinary therapy in a comprehensive inpatient rehab setting. Physiatrist is providing close team supervision and 24 hour management of active medical problems listed below. Physiatrist and rehab team continue to assess barriers to discharge/monitor patient progress toward functional and medical goals  Care Tool:  Bathing    Body parts bathed by patient: Right arm, Left arm, Chest, Abdomen, Face  Body parts bathed by helper: Front perineal area, Buttocks, Right upper leg, Left upper leg, Right lower leg, Left lower leg     Bathing assist Assist Level: Supervision/Verbal cueing     Upper Body Dressing/Undressing Upper body dressing   What is the patient wearing?: Pull over shirt    Upper body assist Assist Level: Minimal Assistance - Patient > 75%    Lower Body Dressing/Undressing Lower body dressing      What is the patient wearing?: Pants, Incontinence brief     Lower body assist Assist for lower body dressing: Total Assistance - Patient < 25%     Toileting Toileting    Toileting assist Assist for toileting: Maximal Assistance - Patient 25 - 49%      Transfers Chair/bed transfer  Transfers assist     Chair/bed transfer assist level: Minimal Assistance - Patient > 75% Chair/bed transfer assistive device: Walker (B PFRW)   Locomotion Ambulation   Ambulation assist      Assist level: Minimal Assistance - Patient > 75% Assistive device: Walker-Eva Max distance: 13f   Walk 10 feet activity   Assist  Walk 10 feet activity did not occur: Safety/medical concerns        Walk 50 feet activity   Assist Walk 50 feet with 2 turns activity did not occur: Safety/medical concerns         Walk 150 feet activity   Assist Walk 150 feet activity did not occur: Safety/medical concerns         Walk 10 feet on uneven surface  activity   Assist Walk 10 feet on uneven surfaces activity did not occur: Safety/medical concerns         Wheelchair     Assist Is the patient using a wheelchair?: Yes Type of Wheelchair: Manual    Wheelchair assist level: Dependent - Patient 0% Max wheelchair distance: >1581f   Wheelchair 50 feet with 2 turns activity    Assist        Assist Level: Dependent - Patient 0%   Wheelchair 150 feet activity     Assist      Assist Level: Dependent - Patient 0%   Blood pressure (!) 141/43, pulse 78, temperature 98.4 F (36.9 C), resp. rate 16, weight 80.6 kg, SpO2 93 %.  Medical Problem List and Plan: 1. Functional deficits secondary to right intertrochanteric hip fracture after mechanical fall.  Status post intramedullary nailing 08/30/2022.  Weightbearing as tolerated, no ROM restrictions              -patient may shower, soap and water only             -ELOS/Goals: 14 days             -Sutures to be removed around 09/12/22             Con't CIR- PT and OT 2.  Antithrombotics: -DVT/anticoagulation:  Pharmaceutical: Eliquis             -antiplatelet therapy: Plavix 75 mg daily             - RLE duplex ordered 9/30 for pain, edema post-op - negative 3. Pain  Management: Robaxin and oxycodone as needed. Ordered ice to hip three times per day for 15 minutes each.   10/4- pain controlled when takes prn's. Con't regimen 4. Anxiety: continue Ativan 0.5 mg every 6 hours as needed anxiety.  Provide emotional support             -  antipsychotic agents: N/A 5. Neuropsych/cognition: This patient is capable of making decisions on her own behalf. 6. Skin/Wound Care: Routine skin checks 7. Fluids/Electrolytes/Nutrition: Routine in and outs with follow-up chemistries 8.  Acute blood loss anemia. Drop in hgb to 7.8 on 10/3, repeat tomorrow.   10/4- Hb stable at 7.8- don't see why dropped- Xray looks good.  9.  AKI on CKD stage III.  Baseline creatinine 1.5-1.9.  Latest follow-up creatinine 1.80.  Follow-up renal services as needed.  Renal ultrasound negative 10.  Diabetes mellitus.  Semglee 6 units nightly.  Sliding scale insulin. Check blood sugars before meals and at bedtime. Monitor closely as PO intake improves. Recommended avoiding added sugars and minimizing bread, pasta, and rice in her diet.   10/4- 1 BG >150- otherwise looks good- con't to monitor; continue regimen Recent Labs    09/09/22 1654 09/09/22 2014 09/10/22 0553  GLUCAP 187* 153* 109*     11.  History of seizure disorder.  Keppra 500 mg twice daily 12.Myelogenous leukemia.  In remission.  Follow-up outpatient with Dr. [name] 13.  Persistent atrial fibrillation. Discussed with cardiology holding low dose eliquis until XR to assess for hematoma.  Cardiac rate controlled 14.  CAD with stenting.  Continue Plavix.  No chest pain or shortness of breath 15.  Chronic diastolic congestive heart failure.  Echocardiogram 04/17/2022 ejection fraction 60 to 65%.  LOSARTAN and JARDIANCE currently on hold.   Resume as needed. On home torsemide '40mg'$  daily             - Restart torsemide '20mg'$               - weight increased, increase toresemide to '40mg'$  daily  10/4- Weight up another 1 kg- will con't  Torsemide and if hasn't gone down by tomorrow, might need an increased dose.  Filed Weights   09/08/22 0535 09/09/22 0600 09/10/22 0551  Weight: 78.7 kg 79.6 kg 80.6 kg     16.  History of uterine breast cancer.  Follow-up outpatient. 17.  Hyperlipidemia.  Lipitor '40mg'$  daily 18.  Constipation.  Senokot twice daily, miralax daily             -LBM 9/29-improved  19.  COPD with remote history of tobacco use.  Duoneb PRN. Check oxygen saturations every shift          - 9/30 - PRN oxygen for says <95%  10/4- using O2 most of time 20.  Acute urinary retention.  Foley catheter tube 9/25.  Continue Flomax.               -Consider voiding trial - DC foley trial 9/30 21. Indigestion             -Maalox prn 22. Low Vitamin D             -Ergocalciferol 50,000 units every 7 days for 7 doses. 23. Pancreas head mass. follows with Dr. Ardis Hughs 24. Obesity BMI 31.73-->32.09: provided dietary eduction. Increase torsemide given increase in weight.  25. 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      I spent a total of  36  minutes on total care today- >50% coordination of care- due to review of labs, xrays; and d/w nursing and PA about results.    LOS: 5 days A FACE TO FACE EVALUATION WAS PERFORMED  Shloimy Michalski 09/10/2022, 9:03 AM

## 2022-09-10 NOTE — Progress Notes (Signed)
Occupational Therapy Session Note  Patient Details  Name: [name] MRN: [id number] Date of Birth: 07-Dec-1941  Today's Date: 09/10/2022 OT Individual Time: 2010-0712 & 1975-8832 OT Individual Time Calculation (min): 41 min & 74 min   Short Term Goals: Week 1:  OT Short Term Goal 1 (Week 1): Patient to perform transfer to [facility] with Mod A using LRAD OT Short Term Goal 2 (Week 1): Patient to perform LE dressing with AE as needed OT Short Term Goal 3 (Week 1): Patient to perform bathing with Mod A and AE as needed OT Short Term Goal 4 (Week 1): Patient to perform shower transfer with Mod A and LRAD  Skilled Therapeutic Interventions/Progress Updates:  Session 1 Skilled OT intervention completed with focus on unilateral UE support in stance during ADL simulated task to promote independence in toileting/LB clothing management. Pt received seated in w/c, with no initial c/o pain in RLE, declining pain meds at this time. Agreeable to session. Received on 1L via [place], with 93% SPO2- remained on 1L for duration of session.  Completed sit > stand with mod A using R hand on RW platform and L hand pushing up from w/c. Able to maintain stance with CGA, during retrieval of clips on posterior peri area and pants region with alternating R and L hand. No trouble maintaining stance, therefore encouraged pt to let therapist remove L platform off of walker to trial standing balance with less UE support. Pt reluctant but agreeable.   Able to stand with min A, then complete 2nd trial of clip retrieval with CGA for balance. Pt was able to slide R foot backwards and to the R with no assist however very slow, and only complete 1 L foot step up before pain/fatigue were reported. Pt continued to decline pain meds, however re-education provided on pre-medicating for therapy progression. Pt remained seated in w/c, with BLE elevated, k-pad applied on 50 degrees for cooling effect for pain reduction of R hip, with all needs  in reach at end of session.  Session 2 Skilled OT intervention completed with focus on toileting needs, cardiorespiratory and BUE endurance. Pt received seated on BSC with NT present. Therapist took over at direct care handoff. Sit > stand with mod A using R PFRW, then max A for toileting needs with pt able to self-initiate pulling her own pants up. Therapist switched out [facility] for w/c with pt able to maintain static standing balance with CGA.  Received on RA with SPO2 at 92%. Pt indicated 10/10 pain in R hip, with pt thinking she had pain meds at lunch however per review only received muscle relaxant. Notified nursing of medication need and in room to administer.  Transported dependently in w/c <> ortho gym for energy conservation. Prior to start of SCIFIT, SPO2 reading on RA at 80%, donned [place] with 0.5 L and rebounded to 98% and remained during session. Completed the following intervals: -Forwards, level 3, 3 mins then 2 more mins with rest needed for fatigue -Backwards, level 3, 5 mins continuously with use of pursed lip breathing technique  Back in room completed mod A sit > stand using R PFRW, then ambulated 10 ft from w/c to EOB with min A needed for balance and intermittent assist for RLE clearance during gait. Pt recalled hook method in prep for bed mobility however still required max A for BLE and trunk management. Pt noticeably with raspy breathing, with nursing made aware of pt's request for breathing treatment this evening. Able  to reposition self with max A due to fatigue of RLE. Doffed TEDS with total A. Pt remained semi-supine, with 4 bed rails up per request, bed alarm on and all immediate needs met at end of session.    Therapy Documentation Precautions:  Precautions Precautions: Fall, Other (comment) Precaution Comments: monitor SpO2 to maintain >92% (up to 2L of O2 via nasal cannula as needed), hx of seizure disorder, no R LE ROM restrictions Restrictions Weight Bearing  Restrictions: Yes RLE Weight Bearing: Weight bearing as tolerated    Therapy/Group: Individual Therapy  Blase Mess, MS, OTR/L  09/10/2022, 3:38 PM

## 2022-09-10 NOTE — Patient Care Conference (Signed)
Inpatient RehabilitationTeam Conference and Plan of Care Update Date: 09/10/2022   Time: 11:44 AM    Patient Name: [name]      Medical Record Number: [id number]  Date of Birth: September 29, 1941 Sex: Female         Room/Bed: 4W09C/4W09C-01 Payor Info: Payor: [employer] MEDICARE / Plan: [place] HMO / Product Type: *No Product type* /    Admit Date/Time:  09/05/2022  3:33 PM  Primary Diagnosis:  Intertrochanteric fracture of right hip [facility])  Hospital Problems: Principal Problem:   Intertrochanteric fracture of right hip [facility])    Expected Discharge Date: Expected Discharge Date: 09/24/22  Team Members Present: Physician leading conference: Dr. Jennye Boroughs Social Worker Present: Erlene Quan, BSW Nurse Present: Dorien Chihuahua, RN PT Present: Becky Sax, PT OT Present: Jennefer Bravo, OT SLP Present: Helaine Chess, SLP PPS Coordinator present : Ileana Ladd, PT     Current Status/Progress Goal Weekly Team Focus  Bowel/Bladder   continent of B/B, PVR with I/O cath PRN  Remain continent  Assist with toileting as needed   Swallow/Nutrition/ Hydration             ADL's   Min A UB dressing, Total A LB self-care and toileting  Supervision/CGA  ADL retraining, AE education, generalized strengthening, cardioresp endurance, activity tolerance   Mobility   supine<>sit supervision, sit<>supine max A, sit<>stands from elevated surfaces min A with [place] walker, gait 35f with EHarmon Pierwalker min A  CGA  functional mobility/transfers, generalized strengthening and endurance, dynamic standing balance/coordination, pain management, gait training, and D/C planning   Communication             Safety/Cognition/ Behavioral Observations            Pain   C/O Right leg pain and back pain. 7/10. PRNs available and effective.  Pain </=3/10  Assess Qshift and prn   Skin   Right hip surgical incisons closed with sutures. Right elbow abrasion covered with foam.  Promote healing and  prevention of infection  Assess Qshift and prn     Discharge Planning:  Patient anticipates discharging home with MOD I/SUP goals. Spouse unable to provide physical assistance.   Team Discussion: Patient with low/stable Hgb post right hip fracture. Muscle spasms and pain addressed. DM stable however weight up with LE edema noted; MD to adjust diuretic. Progress limited by pain; continues with O2 @ 1L/min [place].  Patient on target to meet rehab goals: yes, currently needs mod assist for supine - sit. Able to complete sit - stand with bil. platform walker with mod assist and 4-5 steps with min assist. Completes upper body care with min assist but needs total assist for lower body care and toileting. Goals for discharge set for supervision - min assist overall.  *See Care Plan and progress notes for long and short-term goals.   Revisions to Treatment Plan:  N/a   Teaching Needs: Safety, medications, dietary modifications, transfers, toileting, etc  Current Barriers to Discharge: Decreased caregiver support, Home enviroment access/layout, and Weight bearing restrictions  Possible Resolutions to Barriers: Family education     Medical Summary Current Status: increased weight with CHF; torsemide increased; Hb down to 7.8- from 8.9- pain still limiting- plts down to 124- stable to slightly better  Barriers to Discharge: Decreased family/caregiver support;Home enviroment access/layout;Medical stability;Weight bearing restrictions;Weight;Wound care  Barriers to Discharge Comments: might need ot increase Torsemide more; monitor Hb since has dorpped- will recheck Friday with labs; hematoma?  Continued Need for Acute Rehabilitation Level of Care: The patient requires daily medical management by a physician with specialized training in physical medicine and rehabilitation for the following reasons: Direction of a multidisciplinary physical rehabilitation program to maximize functional independence  : Yes Medical management of patient stability for increased activity during participation in an intensive rehabilitation regime.: Yes Analysis of laboratory values and/or radiology reports with any subsequent need for medication adjustment and/or medical intervention. : Yes   I attest that I was present, lead the team conference, and concur with the assessment and plan of the team.   Dorien Chihuahua B 09/10/2022, 4:02 PM

## 2022-09-11 DIAGNOSIS — S72144A Nondisplaced intertrochanteric fracture of right femur, initial encounter for closed fracture: Secondary | ICD-10-CM | POA: Diagnosis not present

## 2022-09-11 DIAGNOSIS — S72144S Nondisplaced intertrochanteric fracture of right femur, sequela: Secondary | ICD-10-CM | POA: Diagnosis not present

## 2022-09-11 LAB — BASIC METABOLIC PANEL
Anion gap: 4 — ABNORMAL LOW (ref 5–15)
BUN: 39 mg/dL — ABNORMAL HIGH (ref 8–23)
CO2: 28 mmol/L (ref 22–32)
Calcium: 7.2 mg/dL — ABNORMAL LOW (ref 8.9–10.3)
Chloride: 103 mmol/L (ref 98–111)
Creatinine, Ser: 1.85 mg/dL — ABNORMAL HIGH (ref 0.44–1.00)
GFR, Estimated: 27 mL/min — ABNORMAL LOW (ref >60)
Glucose, Bld: 126 mg/dL — ABNORMAL HIGH (ref 70–99)
Potassium: 4.3 mmol/L (ref 3.5–5.1)
Sodium: 135 mmol/L (ref 135–145)

## 2022-09-11 LAB — CBC WITH DIFFERENTIAL/PLATELET
Abs Immature Granulocytes: 0.01 10*3/uL (ref 0.00–0.07)
Basophils Absolute: 0 10*3/uL (ref 0.0–0.1)
Basophils Relative: 0 %
Eosinophils Absolute: 0.3 10*3/uL (ref 0.0–0.5)
Eosinophils Relative: 6 %
HCT: 23.9 % — ABNORMAL LOW (ref 36.0–46.0)
Hemoglobin: 7.6 g/dL — ABNORMAL LOW (ref 12.0–15.0)
Immature Granulocytes: 0 %
Lymphocytes Relative: 25 %
Lymphs Abs: 1 10*3/uL (ref 0.7–4.0)
MCH: 30.5 pg (ref 26.0–34.0)
MCHC: 31.8 g/dL (ref 30.0–36.0)
MCV: 96 fL (ref 80.0–100.0)
Monocytes Absolute: 0.5 10*3/uL (ref 0.1–1.0)
Monocytes Relative: 11 %
Neutro Abs: 2.4 10*3/uL (ref 1.7–7.7)
Neutrophils Relative %: 58 %
Platelets: 128 10*3/uL — ABNORMAL LOW (ref 150–400)
RBC: 2.49 MIL/uL — ABNORMAL LOW (ref 3.87–5.11)
RDW: 13.1 % (ref 11.5–15.5)
WBC: 4.2 10*3/uL (ref 4.0–10.5)
nRBC: 0 % (ref 0.0–0.2)

## 2022-09-11 LAB — GLUCOSE, CAPILLARY
Glucose-Capillary: 113 mg/dL — ABNORMAL HIGH (ref 70–99)
Glucose-Capillary: 170 mg/dL — ABNORMAL HIGH (ref 70–99)
Glucose-Capillary: 93 mg/dL (ref 70–99)
Glucose-Capillary: 218 mg/dL — ABNORMAL HIGH (ref 70–99)

## 2022-09-11 LAB — OCCULT BLOOD X 1 CARD TO LAB, STOOL: Fecal Occult Bld: NEGATIVE

## 2022-09-11 MED ORDER — TORSEMIDE 20 MG PO TABS
20.0000 mg | ORAL_TABLET | Freq: Every day | ORAL | Status: DC
  Administered 2022-09-12 – 2022-09-14 (×3): 20 mg via ORAL
  Filled 2022-09-11 (×3): qty 1

## 2022-09-11 MED ORDER — EMPAGLIFLOZIN 25 MG PO TABS
25.0000 mg | ORAL_TABLET | Freq: Every day | ORAL | Status: DC
  Administered 2022-09-11 – 2022-09-18 (×8): 25 mg via ORAL
  Filled 2022-09-11 (×9): qty 1

## 2022-09-11 MED ORDER — ISOSORBIDE MONONITRATE ER 30 MG PO TB24
30.0000 mg | ORAL_TABLET | Freq: Every day | ORAL | Status: DC
  Administered 2022-09-11 – 2022-10-01 (×21): 30 mg via ORAL
  Filled 2022-09-11 (×22): qty 1

## 2022-09-11 MED ORDER — ACETAMINOPHEN 325 MG PO TABS
650.0000 mg | ORAL_TABLET | Freq: Three times a day (TID) | ORAL | Status: DC
  Administered 2022-09-11 – 2022-09-12 (×4): 650 mg via ORAL
  Filled 2022-09-11 (×4): qty 2

## 2022-09-11 MED ORDER — PSYLLIUM 95 % PO PACK
1.0000 | PACK | Freq: Every day | ORAL | Status: DC
  Administered 2022-09-11 – 2022-10-01 (×19): 1 via ORAL
  Filled 2022-09-11 (×23): qty 1

## 2022-09-11 MED ORDER — IRBESARTAN 75 MG PO TABS
75.0000 mg | ORAL_TABLET | Freq: Every day | ORAL | Status: DC
  Administered 2022-09-11 – 2022-10-01 (×21): 75 mg via ORAL
  Filled 2022-09-11 (×21): qty 1

## 2022-09-11 MED ORDER — METOPROLOL SUCCINATE ER 25 MG PO TB24
12.5000 mg | ORAL_TABLET | Freq: Every day | ORAL | Status: DC
  Administered 2022-09-11 – 2022-09-12 (×2): 12.5 mg via ORAL
  Filled 2022-09-11 (×2): qty 1

## 2022-09-11 MED ORDER — CHLORHEXIDINE GLUCONATE CLOTH 2 % EX PADS
6.0000 | MEDICATED_PAD | CUTANEOUS | Status: DC
  Administered 2022-09-11 – 2022-09-27 (×30): 6 via TOPICAL

## 2022-09-11 NOTE — Progress Notes (Signed)
Recreational Therapy Session Note  Patient Details  Name: [name] MRN: [id number] Date of Birth: [birth year] Today's Date: 09/11/2022 [name]  Attempted eval completion, pt with nursing during scheduled time.  Will attempt eval at another time.  09/11/2022, 3:19 PM

## 2022-09-11 NOTE — Progress Notes (Signed)
Nurse informed from therapy that patient was having chest pain. Nurse informed PA and took vital signs. Patient was unable to describe pain, but was thinking it might be gas. Nurse will continue to monitor and get updates from PA.

## 2022-09-11 NOTE — Progress Notes (Addendum)
EKG follow-up for atrial fibrillation.  Patient does have a history of atrial fibrillation maintained on Eliquis.  We will ask cardiology for follow-up.  Advised to resume Jardiance as well as valsartan as prior to admission with her Toprol-XL 12.5 mg daily and Imdur 30 mg daily.

## 2022-09-11 NOTE — Progress Notes (Signed)
In and out cath for patient 568m resulted. Used 14 french foley for cath. Patient wanted to make sure the smallest was used.

## 2022-09-11 NOTE — Progress Notes (Signed)
Physical Therapy Session Note  Patient Details  Name: [name] MRN: [id number] Date of Birth: [birth year]  Today's Date: 09/11/2022 PT Individual Time: 0730-0824 PT Individual Time Calculation (min): 54 min   Short Term Goals: Week 1:  PT Short Term Goal 1 (Week 1): Pt will perform supine<>sit with min assist consistently PT Short Term Goal 2 (Week 1): Pt will perform sit<>stands using LRAD with min assist PT Short Term Goal 3 (Week 1): Pt will perform bed<>chair transfers using LRAD with min assist PT Short Term Goal 4 (Week 1): Pt will ambulate at least 25f using LRAD with min assist (+2 for safety as needed)  Skilled Therapeutic Interventions/Progress Updates:   Received pt sitting on commode working on having BM. Pt agreeable to PT treatment and reported pain 6/10 in R hip (premedicated) - NT arrived requesting stool sample and that pt return to bed at end of session for bladder scan. Session with emphasis on toileting, functional mobility/transfers, dressing, and dynamic standing balance. Pt stood from commode with R PFRW and min A and required total A for peri-care. Pt transferred bedside commode<>WC stand<>pivot with R PFRW and min A (unable to lift RLE, instead just scooting it along floor). Donned ted hose and non-skid socks in sitting with total A for edema management and donned pants with max A. [facility] and mod A and required max A to pull pants over hips. Pt then requested to return to bed and ambulated 539fwith R PFRW and min A to bed (again sliding/pivoting RLE across floor rather than picking it up). Doffed dirty scrub shirt and donned clean one with supervision and pt transferred sit<>supine with max A for BLE management; however pt was able to hook RLE with LLE. [facility] pulling on headboard and with use of Trendelenburg bed position. RN arrived to administer medication and pt performed ankle circles x20 clockwise/counterclockwise  bilaterally and SLR 2x8 bilaterally AAROM. Concluded session with pt semi-reclined in bed, needs within reach, and bed alarm on. Pt left on RA with SPO2 95% - RN made aware.   SPO2 At rest on 2L: 98% Decreased to 1L at rest: 99% Decreased to RA at rest: 97% After ambulating back to bed on RA: 93%   Therapy Documentation Precautions:  Precautions Precautions: Fall, Other (comment) Precaution Comments: monitor SpO2 to maintain >92% (up to 2L of O2 via nasal cannula as needed), hx of seizure disorder, no R LE ROM restrictions Restrictions Weight Bearing Restrictions: Yes RLE Weight Bearing: Weight bearing as tolerated  Therapy/Group: Individual Therapy AnAlfonse AlpersT, DPT  09/11/2022, 6:51 AM

## 2022-09-11 NOTE — Evaluation (Incomplete)
Recreational Therapy Assessment and Plan  Patient Details  Name: [name] MRN: [id number] Date of Birth: 1941/03/12 Today's Date: 09/11/2022  Rehab Potential:  Good ELOS:   d/c 10/18  Assessment Clinical Impression: ***    Plan    Recommendations for other services: {RECOMMENDATIONS FOR OTHER SERVICES:3049016}  Discharge Criteria: Patient will be discharged from TR if patient refuses treatment 3 consecutive times without medical reason.  If treatment goals not met, if there is a change in medical status, if patient makes no progress towards goals or if patient is discharged from hospital.  The above assessment, treatment plan, treatment alternatives and goals were discussed and mutually agreed upon: {Assessment/Treatment Plan Discussed/Agreed:[id number]}  Leopold Smyers 09/11/2022, 3:18 PM

## 2022-09-11 NOTE — Progress Notes (Signed)
PROGRESS NOTE   Subjective/Complaints: Complaining of hip pain and asks if her pain medication could be increased- discussed scheduling Tylenol and she is agreeable.  Later complained of chest pain to nursing and thought this may related to gas   ROS:   Pt denies SOB, abd pain, CP, N/V/C/D, and vision changes, +hip pain  Objective:   DG HIP UNILAT WITH PELVIS 2-3 VIEWS RIGHT  Result Date: 09/09/2022 CLINICAL DATA:  Hematoma, pain and RIGHT hip. Limited exam due to patient discomfort. EXAM: DG HIP (WITH OR WITHOUT PELVIS) 2-3V RIGHT COMPARISON:  August 30, 2022 FINDINGS: Diffuse demineralization of visualized bones. Post ORIF of the RIGHT hip, nondisplaced intratrochanteric fracture that was present on previous imaging. Intramedullary nailing and dynamic hip screw. No visible signs of fracture. No gross soft tissue swelling. No signs of new fracture. Spinal degenerative changes. Stool throughout the colon as an incidental finding. IMPRESSION: Post ORIF of the RIGHT hip, nondisplaced intratrochanteric fracture that was present on previous imaging. No signs of new fracture. Electronically Signed   By: Zetta Bills M.D.   On: 09/09/2022 12:55   Recent Labs    09/10/22 0410 09/11/22 0403  WBC 4.3 4.2  HGB 7.8* 7.6*  HCT 25.2* 23.9*  PLT 124* 128*   Recent Labs    09/11/22 0403  NA 135  K 4.3  CL 103  CO2 28  GLUCOSE 126*  BUN 39*  CREATININE 1.85*  CALCIUM 7.2*    Intake/Output Summary (Last 24 hours) at 09/11/2022 1000 Last data filed at 09/11/2022 0854 Gross per 24 hour  Intake 652 ml  Output 400 ml  Net 252 ml        Physical Exam: Vital Signs Blood pressure (!) 155/55, pulse 77, temperature 98.2 F (36.8 C), temperature source Oral, resp. rate 20, weight 80.4 kg, SpO2 94 %.    General: awake, alert, appropriate, BMI 32; sitting up in bed; watching TV; NAD HENT: conjugate gaze; oropharynx moist CV:  regular rate; no JVD Pulmonary: CTA B/L; no W/R/R- good air movement GI: soft, NT, ND, (+)BS Psychiatric: appropriate- interactive Neurological: Ox3  clubbing, cyanosis. 2+ b/l LE edema. Pulses are 2+ Psych: Pt's affect is a little flat. Pt is cooperative Neuro:  Alert and oriented x4,follows commands, CN 2-12 intact  PE from prior encounters: Skin: Clean and intact without signs of breakdown R hip with 2 border foam dressings- CDI Sensation decreased to LT in b/l LE in stocking glove distribution, normal insight and judgement Strength 5/5 in b/l UE Strength 5/5 in LLE Strength 1-2/5 hip flexion and knee extension, 4/5 ankle PF and DF  FTN intact b/l Musculoskeletal:  Normal bulk and tone.  R hip appropriately tender, ecchymosis along R thigh Ambulating 3 feet minA  Assessment/Plan: 1. Functional deficits which require 3+ hours per day of interdisciplinary therapy in a comprehensive inpatient rehab setting. Physiatrist is providing close team supervision and 24 hour management of active medical problems listed below. Physiatrist and rehab team continue to assess barriers to discharge/monitor patient progress toward functional and medical goals  Care Tool:  Bathing    Body parts bathed by patient: Right arm, Left arm, Chest, Abdomen, Face  Body parts bathed by helper: Front perineal area, Buttocks, Right upper leg, Left upper leg, Right lower leg, Left lower leg     Bathing assist Assist Level: Supervision/Verbal cueing     Upper Body Dressing/Undressing Upper body dressing   What is the patient wearing?: Pull over shirt    Upper body assist Assist Level: Minimal Assistance - Patient > 75%    Lower Body Dressing/Undressing Lower body dressing      What is the patient wearing?: Pants, Incontinence brief     Lower body assist Assist for lower body dressing: Total Assistance - Patient < 25%     Toileting Toileting    Toileting assist Assist for toileting: Maximal  Assistance - Patient 25 - 49%     Transfers Chair/bed transfer  Transfers assist     Chair/bed transfer assist level: Minimal Assistance - Patient > 75% Chair/bed transfer assistive device: Programmer, multimedia   Ambulation assist      Assist level: Minimal Assistance - Patient > 75% Assistive device: Walker-platform Max distance: 53f   Walk 10 feet activity   Assist  Walk 10 feet activity did not occur: Safety/medical concerns        Walk 50 feet activity   Assist Walk 50 feet with 2 turns activity did not occur: Safety/medical concerns         Walk 150 feet activity   Assist Walk 150 feet activity did not occur: Safety/medical concerns         Walk 10 feet on uneven surface  activity   Assist Walk 10 feet on uneven surfaces activity did not occur: Safety/medical concerns         Wheelchair     Assist Is the patient using a wheelchair?: Yes Type of Wheelchair: Manual    Wheelchair assist level: Dependent - Patient 0% Max wheelchair distance: >1530f   Wheelchair 50 feet with 2 turns activity    Assist        Assist Level: Dependent - Patient 0%   Wheelchair 150 feet activity     Assist      Assist Level: Dependent - Patient 0%   Blood pressure (!) 155/55, pulse 77, temperature 98.2 F (36.8 C), temperature source Oral, resp. rate 20, weight 80.4 kg, SpO2 94 %.  Medical Problem List and Plan: 1. Functional deficits secondary to right intertrochanteric hip fracture after mechanical fall.  Status post intramedullary nailing 08/30/2022.  Weightbearing as tolerated, no ROM restrictions              -patient may shower, soap and water only             -ELOS/Goals: 14 days             -Sutures to be removed around 09/12/22             Continue CIR- PT and OT 2.  Antithrombotics: -DVT/anticoagulation:  Pharmaceutical: Eliquis             -antiplatelet therapy: Plavix 75 mg daily             - RLE duplex  ordered 9/30 for pain, edema post-op - negative 3. Pain Management: Robaxin and oxycodone as needed. Ordered ice to hip three times per day for 15 minutes each. Schedule tylenol '650mg'$  TID.  4. Anxiety: continue Ativan 0.5 mg every 6 hours as needed anxiety.  Provide emotional support             -antipsychotic  agents: N/A 5. Neuropsych/cognition: This patient is capable of making decisions on her own behalf. 6. Skin/Wound Care: Routine skin checks 7. Fluids/Electrolytes/Nutrition: Routine in and outs with follow-up chemistries 8.  Acute blood loss anemia. Hgb down to 7.6, repeat tomorrow 9.  AKI on CKD stage III.  Baseline creatinine 1.5-1.9.  Latest follow-up creatinine 1.80.  Follow-up renal services as needed.  Renal ultrasound negative. Currently at baseline, weight stable, will decrease torsemide back to '20mg'$  daily 10.  Diabetes mellitus.  Semglee 6 units nightly.  Sliding scale insulin. Check blood sugars before meals and at bedtime. Monitor closely as PO intake improves. Recommended avoiding added sugars and minimizing bread, pasta, and rice in her diet. Decrease CBG checks to [facility] and d/c HS sliding scale Recent Labs    09/10/22 1704 09/10/22 2038 09/11/22 0608  GLUCAP 171* 184* 113*     11.  History of seizure disorder.  Keppra 500 mg twice daily 12.Myelogenous leukemia.  In remission.  Follow-up outpatient with Dr. [name] 13.  Persistent atrial fibrillation. Discussed with cardiology holding low dose eliquis until XR to assess for hematoma.  Cardiac rate controlled 14.  CAD with stenting.  Continue Plavix.  No chest pain or shortness of breath 15.  Chronic diastolic congestive heart failure.  Echocardiogram 04/17/2022 ejection fraction 60 to 65%.  LOSARTAN and JARDIANCE currently on hold.   Resume as needed. On home torsemide '40mg'$  daily             - Restart torsemide '20mg'$               - weight increased, increase toresemide to '40mg'$  daily  10/4- Weight up another 1 kg- will con't  Torsemide and if hasn't gone down by tomorrow, might need an increased dose.  Filed Weights   09/09/22 0600 09/10/22 0551 09/11/22 0346  Weight: 79.6 kg 80.6 kg 80.4 kg     16.  History of uterine breast cancer.  Follow-up outpatient. 17.  Hyperlipidemia.  Lipitor '40mg'$  daily 18.  Constipation.  Senokot twice daily, miralax daily             -LBM 9/29-improved  19.  COPD with remote history of tobacco use.  Duoneb PRN. Check oxygen saturations every shift          - 9/30 - PRN oxygen for says <95%  10/4- using O2 most of time 20.  Acute urinary retention.  Foley catheter tube 9/25.  Continue Flomax.               -Consider voiding trial - DC foley trial 9/30 21. Indigestion             -Maalox prn 22. Low Vitamin D             -Ergocalciferol 50,000 units every 7 days for 7 doses. 23. Pancreas head mass. follows with Dr. Ardis Hughs 24. Obesity BMI 31.73-->32.09: provided dietary eduction. Increase torsemide given increase in weight.  25. 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      LOS: 6 days A FACE TO FACE EVALUATION WAS PERFORMED  [name] P Malyssa Maris 09/11/2022, 10:00 AM

## 2022-09-11 NOTE — Progress Notes (Signed)
Occupational Therapy Session Note  Patient Details  Name: [name] MRN: [id number] Date of Birth: [birth year]  Today's Date: 09/11/2022 OT Individual Time: 3536-1443 & 1417-1530 OT Individual Time Calculation (min): 57 min & 73 min   Short Term Goals: Week 1:  OT Short Term Goal 1 (Week 1): Patient to perform transfer to [facility] with Mod A using LRAD OT Short Term Goal 2 (Week 1): Patient to perform LE dressing with AE as needed OT Short Term Goal 3 (Week 1): Patient to perform bathing with Mod A and AE as needed OT Short Term Goal 4 (Week 1): Patient to perform shower transfer with Mod A and LRAD  Skilled Therapeutic Interventions/Progress Updates:  Session 1 Skilled OT intervention completed with focus on activity tolerance, A/AA/ROM of RLE to maximize independence in functional bed mobility and transfers. Pt received semi-supine in bed, 7/10 pain in R hip and back, with c/o chest pain also. Received on RA, with SPO2 on dynamap at 79%, added 0.5 L O2 via [place] with rebound to 95% SPO2 and remained for duration of session. Nurse notified of pain medication need and status, with nurse in room to assess vitals and everything WNL.   Pt continues to report that she gets uncomfortable with being unable to lift her own leg and she gets stiff. Education provided on bed level exercises that can be utilized with gait belt as leg lifter to assist with movement of leg in a pain free zone. Completed the following with gait belt looped loosely around R foot and pillow case under R foot for minimizing friction: -heel slides/knee flexion 2x10 -clam shells 2x10  Transitioned to EOB with overall mod A with cues for RLE activation/lateral sliding and trunk elevation hand placement. Min A needed to secure feet on floor. Seated EOB pt completed the following with pillow under R foot: -foot slides forwards/backwards, progressing to AROM knee extension/flex, sustained knee extension with dorsiflexion  Sit > stand  x2 with R PFRW with mod A, able to maintain balance with CGA, with 2 trials of R foot slides forwards/backwards and L and R. Pt still exhibits limited active foot clearance despite multimodal cueing beyond 2 times. Shifted BLE towards HOB with side step method using R PFRW, physical assist needed for R foot. Utilized hook method for max A bed mobility with assist needed for BLE management. Pt remained semi-upright in bed, with BLE elevated, bed alarm on and all needs in reach at end of session.  Session 2 Skilled OT intervention completed with focus on activity tolerance, functional ambulation and sit > stands, BUE endurance. Pt received upright in bed, did not indicate pain just soreness- premedicated, with therapist offering rest breaks and repositioning as needed. Received with [place] donned however not plugged into supplemental O2, with SPO2 reading at 97%. Pt remained on RA for duration of session, with 2 episodes of desat into 80-85% zone, however was able to rebound to >92% with pursed lip breathing.   Transitioned to EOB with overall min A with cues for RLE activation/lateral sliding and trunk elevation hand placement. Sit > stand with min A using R PFRW, then ambulated 10 ft to w/c with min A, and no physical assist for RLE this session after cues provided on weight shifting to LLE for offloading RLE during swing phase of gait. Transported dependently in w/c <> gym for energy conservation.   To prep for sit > stands with standard RW vs platform, pt completed mass practice x10 sit >  stands in parallel bars. Initiated with higher bar placement and BUE on bar to pull up, with pt focusing on anterior weight shifting and RLE WB, then progressed with trials to lower height bar and eventually able to stand with both arms pushing up from w/c arm rests all with CGA for powering up! In stance on 1 trial, was able to weight shift R<>L with CGA for balance with BUE support on bars. Did require several rest breaks  due to conditioning however tolerated activity well.   Seated at low table, pt completed complex peg design on elevated surface, with 2 lb wrist weights donned to each arm, to promote endurance. Pt required min A for correcting mistakes. Back in room, pt stand pivot with min A with R PFRW and max A bed mobility for BLE management to supine. Mod A needed for bed positioning. Pt remained semi-upright, TEDS doffed, [place] in reach if felt SOB however vitals WNL therefore remained on RA, all needs in reach at end of session.   Therapy Documentation Precautions:  Precautions Precautions: Fall, Other (comment) Precaution Comments: monitor SpO2 to maintain >92% (up to 2L of O2 via nasal cannula as needed), hx of seizure disorder, no R LE ROM restrictions Restrictions Weight Bearing Restrictions: Yes RLE Weight Bearing: Weight bearing as tolerated    Therapy/Group: Individual Therapy  Blase Mess, MS, OTR/L  09/11/2022, 3:45 PM

## 2022-09-12 DIAGNOSIS — S72144S Nondisplaced intertrochanteric fracture of right femur, sequela: Secondary | ICD-10-CM | POA: Diagnosis not present

## 2022-09-12 DIAGNOSIS — S72144A Nondisplaced intertrochanteric fracture of right femur, initial encounter for closed fracture: Secondary | ICD-10-CM | POA: Diagnosis not present

## 2022-09-12 LAB — CBC WITH DIFFERENTIAL/PLATELET
Abs Immature Granulocytes: 0.01 10*3/uL (ref 0.00–0.07)
Basophils Absolute: 0 10*3/uL (ref 0.0–0.1)
Basophils Relative: 0 %
Eosinophils Absolute: 0.3 10*3/uL (ref 0.0–0.5)
Eosinophils Relative: 6 %
HCT: 25.4 % — ABNORMAL LOW (ref 36.0–46.0)
Hemoglobin: 7.8 g/dL — ABNORMAL LOW (ref 12.0–15.0)
Immature Granulocytes: 0 %
Lymphocytes Relative: 25 %
Lymphs Abs: 1.2 10*3/uL (ref 0.7–4.0)
MCH: 29.8 pg (ref 26.0–34.0)
MCHC: 30.7 g/dL (ref 30.0–36.0)
MCV: 96.9 fL (ref 80.0–100.0)
Monocytes Absolute: 0.5 10*3/uL (ref 0.1–1.0)
Monocytes Relative: 10 %
Neutro Abs: 2.8 10*3/uL (ref 1.7–7.7)
Neutrophils Relative %: 59 %
Platelets: 131 10*3/uL — ABNORMAL LOW (ref 150–400)
RBC: 2.62 MIL/uL — ABNORMAL LOW (ref 3.87–5.11)
RDW: 13.2 % (ref 11.5–15.5)
WBC: 4.9 10*3/uL (ref 4.0–10.5)
nRBC: 0 % (ref 0.0–0.2)

## 2022-09-12 LAB — GLUCOSE, CAPILLARY
Glucose-Capillary: 96 mg/dL (ref 70–99)
Glucose-Capillary: 121 mg/dL — ABNORMAL HIGH (ref 70–99)
Glucose-Capillary: 170 mg/dL — ABNORMAL HIGH (ref 70–99)

## 2022-09-12 LAB — URINALYSIS, ROUTINE W REFLEX MICROSCOPIC
Bilirubin Urine: NEGATIVE
Glucose, UA: >=500 mg/dL — AB
Hgb urine dipstick: NEGATIVE
Ketones, ur: NEGATIVE mg/dL
Leukocytes,Ua: NEGATIVE
Nitrite: NEGATIVE
Protein, ur: NEGATIVE mg/dL
Specific Gravity, Urine: 1.022 (ref 1.005–1.030)
pH: 5 (ref 5.0–8.0)

## 2022-09-12 MED ORDER — TORSEMIDE 20 MG PO TABS
20.0000 mg | ORAL_TABLET | Freq: Once | ORAL | Status: AC
  Administered 2022-09-12: 20 mg via ORAL
  Filled 2022-09-12: qty 1

## 2022-09-12 MED ORDER — METOPROLOL SUCCINATE ER 25 MG PO TB24
25.0000 mg | ORAL_TABLET | Freq: Every day | ORAL | Status: DC
  Administered 2022-09-13 – 2022-09-17 (×5): 25 mg via ORAL
  Filled 2022-09-12 (×5): qty 1

## 2022-09-12 MED ORDER — ACETAMINOPHEN 500 MG PO TABS
1000.0000 mg | ORAL_TABLET | Freq: Three times a day (TID) | ORAL | Status: DC
  Administered 2022-09-12 – 2022-09-18 (×18): 1000 mg via ORAL
  Filled 2022-09-12 (×18): qty 2

## 2022-09-12 NOTE — Progress Notes (Signed)
PROGRESS NOTE   Subjective/Complaints: Hgb 7.8 this morning, discussed with patient that we will given her break from lab draws until Monday Appreciate Dan reaching out to cardiology regarding afib- several medications resumed   ROS:   Pt denies SOB, abd pain, CP, N/V/C/D, and vision changes, +hip pain- continues to be severe  Objective:   No results found. Recent Labs    09/11/22 0403 09/12/22 0435  WBC 4.2 4.9  HGB 7.6* 7.8*  HCT 23.9* 25.4*  PLT 128* 131*   Recent Labs    09/11/22 0403  NA 135  K 4.3  CL 103  CO2 28  GLUCOSE 126*  BUN 39*  CREATININE 1.85*  CALCIUM 7.2*    Intake/Output Summary (Last 24 hours) at 09/12/2022 1056 Last data filed at 09/12/2022 0830 Gross per 24 hour  Intake 540 ml  Output 750 ml  Net -210 ml        Physical Exam: Vital Signs Blood pressure (!) 134/106, pulse 80, temperature 98.4 F (36.9 C), temperature source Oral, resp. rate 15, weight 77.9 kg, SpO2 100 %.    General: awake, alert, appropriate, BMI 32; sitting up in bed; watching TV; NAD HENT: conjugate gaze; oropharynx moist CV: regular rate; no JVD Pulmonary: CTA B/L; no W/R/R- good air movement GI: soft, NT, ND, (+)BS Psychiatric: appropriate- interactive Neurological: Ox3  clubbing, cyanosis. 2+ b/l LE edema. Pulses are 2+ Psych: Pt's affect is a little flat. Pt is cooperative Neuro:  Alert and oriented x4,follows commands, CN 2-12 intact  PE from prior encounters: Skin: Clean and intact without signs of breakdown R hip with 2 border foam dressings- CDI Sensation decreased to LT in b/l LE in stocking glove distribution, normal insight and judgement Strength 5/5 in b/l UE Strength 5/5 in LLE Strength 1-2/5 hip flexion and knee extension, 4/5 ankle PF and DF  FTN intact b/l Musculoskeletal:  Normal bulk and tone.  R hip appropriately tender, ecchymosis along R thigh Ambulating 3 feet minA GU: total  A for pericare  Assessment/Plan: 1. Functional deficits which require 3+ hours per day of interdisciplinary therapy in a comprehensive inpatient rehab setting. Physiatrist is providing close team supervision and 24 hour management of active medical problems listed below. Physiatrist and rehab team continue to assess barriers to discharge/monitor patient progress toward functional and medical goals  Care Tool:  Bathing    Body parts bathed by patient: Right arm, Left arm, Chest, Abdomen, Face   Body parts bathed by helper: Front perineal area, Buttocks, Right upper leg, Left upper leg, Right lower leg, Left lower leg     Bathing assist Assist Level: Supervision/Verbal cueing     Upper Body Dressing/Undressing Upper body dressing   What is the patient wearing?: Pull over shirt    Upper body assist Assist Level: Minimal Assistance - Patient > 75%    Lower Body Dressing/Undressing Lower body dressing      What is the patient wearing?: Pants     Lower body assist Assist for lower body dressing: Moderate Assistance - Patient 50 - 74%     Toileting Toileting    Toileting assist Assist for toileting: Maximal Assistance - Patient 25 -  49%     Transfers Chair/bed transfer  Transfers assist     Chair/bed transfer assist level: Minimal Assistance - Patient > 75% Chair/bed transfer assistive device: Museum/gallery exhibitions officer assist      Assist level: Minimal Assistance - Patient > 75% Assistive device: Walker-platform Max distance: 69f   Walk 10 feet activity   Assist  Walk 10 feet activity did not occur: Safety/medical concerns        Walk 50 feet activity   Assist Walk 50 feet with 2 turns activity did not occur: Safety/medical concerns         Walk 150 feet activity   Assist Walk 150 feet activity did not occur: Safety/medical concerns         Walk 10 feet on uneven surface  activity   Assist Walk 10 feet on uneven  surfaces activity did not occur: Safety/medical concerns         Wheelchair     Assist Is the patient using a wheelchair?: Yes Type of Wheelchair: Manual    Wheelchair assist level: Dependent - Patient 0% Max wheelchair distance: >1549f   Wheelchair 50 feet with 2 turns activity    Assist        Assist Level: Dependent - Patient 0%   Wheelchair 150 feet activity     Assist      Assist Level: Dependent - Patient 0%   Blood pressure (!) 134/106, pulse 80, temperature 98.4 F (36.9 C), temperature source Oral, resp. rate 15, weight 77.9 kg, SpO2 100 %.  Medical Problem List and Plan: 1. Functional deficits secondary to right intertrochanteric hip fracture after mechanical fall.  Status post intramedullary nailing 08/30/2022.  Weightbearing as tolerated, no ROM restrictions              -patient may shower, soap and water only             -ELOS/Goals: 14 days             -Sutures to be removed around 09/12/22             Continue CIR- PT and OT  Therapy notes reviewed 2.  Atrial fibrillation: Eliquis, toprol XL restarted, will increase to '25mg'$  HS             -antiplatelet therapy: Plavix 75 mg daily             - RLE duplex ordered 9/30 for pain, edema post-op - negative 3. Pain Management: Robaxin and oxycodone as needed. Ordered ice to hip three times per day for 15 minutes each. Increase scheduled tylenol to 1,'0000mg'$  TID.  4. Anxiety: continue Ativan 0.5 mg every 6 hours as needed anxiety.  Provide emotional support             -antipsychotic agents: N/A 5. Neuropsych/cognition: This patient is capable of making decisions on her own behalf. 6. Skin/Wound Care: Routine skin checks 7. Fluids/Electrolytes/Nutrition: Routine in and outs with follow-up chemistries 8.  Acute blood loss anemia. Hgb down to 7.6, repeat tomorrow 9.  AKI on CKD stage III.  Baseline creatinine 1.5-1.9.  Latest follow-up creatinine 1.80.  Follow-up renal services as needed.  Renal  ultrasound negative. Currently at baseline, weight stable, will decrease torsemide back to '20mg'$  daily 10.  Diabetes mellitus.  Semglee 6 units nightly.  Sliding scale insulin. Check blood sugars before meals and at bedtime. Monitor closely as PO intake improves. Recommended avoiding added sugars and minimizing  bread, pasta, and rice in her diet. Decrease CBG checks to [facility] and d/c HS sliding scale Recent Labs    09/11/22 1141 09/11/22 1700 09/11/22 2051  GLUCAP 170* 93 218*     11.  History of seizure disorder.  Keppra 500 mg twice daily 12.Myelogenous leukemia.  In remission.  Follow-up outpatient with Dr. [name] 13.  Persistent atrial fibrillation. Discussed with cardiology holding low dose eliquis until XR to assess for hematoma.  Cardiac rate controlled 14.  CAD with stenting.  Continue Plavix.  No chest pain or shortness of breath 15.  Chronic diastolic congestive heart failure.  Echocardiogram 04/17/2022 ejection fraction 60 to 65%.  LOSARTAN and JARDIANCE currently on hold.   Resume as needed. On home torsemide '40mg'$  daily             - Restart torsemide '20mg'$               - weight increased, increase toresemide to '40mg'$  daily  10/4- Weight up another 1 kg- will con't Torsemide and if hasn't gone down by tomorrow, might need an increased dose.  Filed Weights   09/10/22 0551 09/11/22 0346 09/12/22 0500  Weight: 80.6 kg 80.4 kg 77.9 kg     16.  History of uterine breast cancer.  Follow-up outpatient. 17.  Hyperlipidemia.  Lipitor '40mg'$  daily 18.  Constipation.  Senokot twice daily, miralax daily             -LBM 9/29-improved  19.  COPD with remote history of tobacco use.  Duoneb PRN. Check oxygen saturations every shift. Goal sat is 88-92%, adjusted order 20.  Acute urinary retention.  Foley catheter tube 9/25.  Continue Flomax.               -Consider voiding trial - DC foley trial 9/30 21. Indigestion             -Maalox prn 22. Low Vitamin D             -Ergocalciferol 50,000  units every 7 days for 7 doses. 23. Pancreas head mass. follows with Dr. Ardis Hughs 24. Obesity BMI 31.73-->32.09: provided dietary eduction. Increase torsemide given increase in weight.  25. 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  26. HTN: increase toprol XL to '25mg'$  HS     LOS: 7 days A FACE TO FACE EVALUATION WAS PERFORMED  Martha Clan P Nithila Sumners 09/12/2022, 10:56 AM

## 2022-09-12 NOTE — Progress Notes (Signed)
Physical Therapy Weekly Progress Note  Patient Details  Name: [name] MRN: [id number] Date of Birth: 1941/03/22  Beginning of progress report period: September 06, 2022 End of progress report period: September 12, 2022  Today's Date: 09/12/2022 PT Individual Time: 6644-0347 PT Individual Time Calculation (min): 42 min  Today's Date: 09/12/2022 PT Missed Time: 33 Minutes Missed Time Reason: Toileting  Patient has met 2 of 4 short term goals. Pt demonstrates slow progress towards long term goals. Pt is able to transfer supine<>sitting EOB with as little as supervision using bed features but can require mod A when pain levels are high - requires max A for BLE management for sit<>supine. Pt is able to perform sit<>stands with R PFRW and mod A and stand<>pivot transfers with R PFRW and min A. Pt is only able to ambulate ~73ft with R PFRW and min A prior to stopping due to increased pain and fatigue. Pt has difficulty with foot clearance with gait and tends to shuffle feet along floor rather than trying to pick them up. Pt is limited by pain in R hip, generalized weakness/deconditioning, decreased balance, and poor activity tolerance. Pt has been able to wean from 2L down to RA at times, but can fluctuate and require 0.5-1L O2 with activity.   Patient continues to demonstrate the following deficits muscle weakness and muscle joint tightness, decreased cardiorespiratoy endurance and decreased oxygen support, and decreased standing balance, decreased postural control, and decreased balance strategies and therefore will continue to benefit from skilled PT intervention to increase functional independence with mobility.  Patient progressing toward long term goals..  Continue plan of care.  PT Short Term Goals Week 1:  PT Short Term Goal 1 (Week 1): Pt will perform supine<>sit with min assist consistently PT Short Term Goal 1 - Progress (Week 1): Met PT Short Term Goal 2 (Week 1): Pt will perform  sit<>stands using LRAD with min assist PT Short Term Goal 2 - Progress (Week 1): Progressing toward goal PT Short Term Goal 3 (Week 1): Pt will perform bed<>chair transfers using LRAD with min assist PT Short Term Goal 3 - Progress (Week 1): Met PT Short Term Goal 4 (Week 1): Pt will ambulate at least 49ft using LRAD with min assist (+2 for safety as needed) PT Short Term Goal 4 - Progress (Week 1): Progressing toward goal Week 2:  PT Short Term Goal 1 (Week 2): pt will transfer sit<>supine with min A consistantly PT Short Term Goal 2 (Week 2): Pt will perform sit<>stands using LRAD with min assist PT Short Term Goal 3 (Week 2): Pt will ambulate at least 26ft using LRAD with min assist  Skilled Therapeutic Interventions/Progress Updates:  Ambulation/gait training;Community reintegration;DME/adaptive equipment instruction;Neuromuscular re-education;Psychosocial support;Stair training;UE/LE Strength taining/ROM;Wheelchair propulsion/positioning;Balance/vestibular training;Discharge planning;Functional electrical stimulation;Pain management;Skin care/wound management;Therapeutic Activities;UE/LE Coordination activities;Disease management/prevention;Functional mobility training;Patient/family education;Splinting/orthotics;Therapeutic Exercise;Visual/perceptual remediation/compensation   Today's Interventions: Received pt sitting on commode, reporting just getting there and requesting time to have BM. Allowed time and upon returning pt transferring from bedside commode to [facility] with RN. Pt agreeable to PT treatment and reported pain 8/10 in RLE (premedicated) - repositioning, rest breaks, and distraction done to reduce pain levels. Session with emphasis on functional mobility/transfers, dressing, generalized strengthening and endurance, and standing balance/coordination.  Donned ted hose and non-skid socks seated with total A and MD arrived for morning rounds. Donned pants in sitting with max A and pt stood  with R PFRW and min A and required max A to pull pants  over hips. Pt's HR spiked to 170bpm on dynomat but when taken manually was 75bpm. RN administered medications and insulin injections while pt sat in WC and brushed hair with set up assist. Pt transported to/from room in [facility] dependently for time management purposes. Pt performed seated BLE strengthening on Kinetron at 40 cm/sec for 1 minute x 4 trials with BUE support and emphasis on RLE ROM and strengthening - required rest breaks throughout. Returned to room and concluded session with pt sitting in WC, needs within reach, and seatbelt alarm on. Provided pt with ice pack for R hip. Pt left on RA with SPO2 99%. 33 minutes missed of skilled physical therapy due to toileting.   SPO2 After transferring to [facility] on 2L: 99% At rest on RA: 98% On Kinetron on RA: 92%  Therapy Documentation Precautions:  Precautions Precautions: Fall, Other (comment) Precaution Comments: monitor SpO2 to maintain >92% (up to 2L of O2 via nasal cannula as needed), hx of seizure disorder, no R LE ROM restrictions Restrictions Weight Bearing Restrictions: Yes RLE Weight Bearing: Weight bearing as tolerated  Therapy/Group: Individual Therapy Alfonse Alpers PT, DPT  09/12/2022, 6:59 AM

## 2022-09-12 NOTE — Plan of Care (Signed)
  Problem: Activity: Goal: Ability to ambulate and perform ADLs will improve Outcome: Progressing   Problem: Clinical Measurements: Goal: Postoperative complications will be avoided or minimized Outcome: Progressing   Problem: Pain Management: Goal: Pain level will decrease Outcome: Progressing   

## 2022-09-12 NOTE — Progress Notes (Signed)
Occupational Therapy Session Note  Patient Details  Name: [name] MRN: [id number] Date of Birth: 06-15-1941  Today's Date: 09/12/2022 OT Individual Time: 1443-1540 & 0867-6195 OT Individual Time Calculation (min): 57 min & 67 min OT missed time: 8 mins Missed time reason: pain/fatigue   Short Term Goals: Week 1:  OT Short Term Goal 1 (Week 1): Patient to perform transfer to [facility] with Mod A using LRAD OT Short Term Goal 2 (Week 1): Patient to perform LE dressing with AE as needed OT Short Term Goal 3 (Week 1): Patient to perform bathing with Mod A and AE as needed OT Short Term Goal 4 (Week 1): Patient to perform shower transfer with Mod A and LRAD  Skilled Therapeutic Interventions/Progress Updates:  Session 1 Skilled OT intervention completed with focus on . Pt received seated in w/c, improved pain report this morning after meds (pre-medicated), on RA at 96% SPO2 and remained for duration of session.   Re-education provided about self-bathing vs relying on staff to do it per pt report, to promote independence with self-care needs in prep for home. Seated at sink, pt completed UB bathing with supervision, cues needed for effort and accessing all regions, with therapist assisting donning anti-itch lotion to dry regions pre nursing request. Rash under L breast appear to be improving however still irritated under R with education provided about cleaning/drying and having nursing apply the anti-fungal powder daily if not x2 day. Applied powder with total A for efficiency.  Transported in w/c <> gym for time. Sit > stand with min A using high table for UE support. Able to maintain stance with CGA, with cues needed for avoiding compensatory strategies like leaning stomach on table for balance vs using BLE and core. Completed clip transfer activity with varying clip strengths for static balance without AD. No difficulty in transferring them however pt reluctant to release RUE due to RLE being  weak however no LOB noted.  Completed BLE edema management and strengthening exercises seated in w/c: -knee extension x10 each leg -RLE dorsi and plantar flexion x10  Back in room, assisted pt at the min A ambulatory transfer level using R PFRW about 5 ft to [facility] for toileting needs, total A for doffing pants for urgency. Remained seated on [facility] for attempt for BM with nurse notified of pt status, call bell in reach and all other needs met at end of session.  Session 2 Skilled OT intervention completed with focus on activity tolerance, BUE strengthening to promote cardio-respiratory endurance needed for BADLs. Pt received seated in w/c, 8/10 pain in R hip, pre-medicated however reported medication just didn't have time to kick in yet. Pt agreeable to modified tasks seated at start of session due to pain. On RA, with SPO2 at 96%, remained on RA for duration and conclusion of session.  Transported dependently in w/c <> gym for energy conservation. Completed the following seated in w/c: (With yellow theraband) x10 reps Horizontal abduction Self-anchored shoulder flexion each arm Self-anchored bicep flexion each arm Alternating chest presses Shoulder external rotation Therapist anchored scapular retraction Shoulder diagonal pulls Multimodal cues needed for form and technique throughout.  Mod A sit > stand using R PFRW, with attempt to ambulate for RLE activation and endurance, however pt only able to take 2 steps due to pain with physical facilitation needed for RLE clearance. Pt reported being unable to ambulate anymore due to pain. Transported dependently back to room to assist pt back to bed.  In room,  pt completed mod A sit > stand and min A stand pivot with R PFRW to EOB with assist for RLE as mentioned above. Once seated, pt reported urge to use bathroom. Completed sit > stand and stand pivot at same assist to [facility] with brief falling to floor therefore no assist. Continent of urinary void,  incontinent of small BM, with pt unaware. Pre-threaded brief with total A for time. Mod A sit > stand with pt assisting with front peri-care and donning pants up on L side, therapist completing posterior and overall mod A for toileting needs. Transferred back to EOB with same assist, max A bed mobility to supine, max A for repositioning and BLE placement on pillows. Pt remained semi-upright in bed with bed alarm on, ice on R hip, and all needs met at end of session. Pt missed 8 mins of skilled OT due to pain/fatigue.   Therapy Documentation Precautions:  Precautions Precautions: Fall, Other (comment) Precaution Comments: monitor SpO2 to maintain >92% (up to 2L of O2 via nasal cannula as needed), hx of seizure disorder, no R LE ROM restrictions Restrictions Weight Bearing Restrictions: Yes RLE Weight Bearing: Weight bearing as tolerated    Therapy/Group: Individual Therapy  Blase Mess, MS, OTR/L  09/12/2022, 3:34 PM

## 2022-09-13 LAB — GLUCOSE, CAPILLARY
Glucose-Capillary: 96 mg/dL (ref 70–99)
Glucose-Capillary: 176 mg/dL — ABNORMAL HIGH (ref 70–99)
Glucose-Capillary: 109 mg/dL — ABNORMAL HIGH (ref 70–99)
Glucose-Capillary: 167 mg/dL — ABNORMAL HIGH (ref 70–99)

## 2022-09-14 DIAGNOSIS — I5032 Chronic diastolic (congestive) heart failure: Secondary | ICD-10-CM | POA: Diagnosis not present

## 2022-09-14 DIAGNOSIS — S72144A Nondisplaced intertrochanteric fracture of right femur, initial encounter for closed fracture: Secondary | ICD-10-CM | POA: Diagnosis not present

## 2022-09-14 DIAGNOSIS — N183 Chronic kidney disease, stage 3 unspecified: Secondary | ICD-10-CM

## 2022-09-14 DIAGNOSIS — E1122 Type 2 diabetes mellitus with diabetic chronic kidney disease: Secondary | ICD-10-CM

## 2022-09-14 DIAGNOSIS — Z794 Long term (current) use of insulin: Secondary | ICD-10-CM | POA: Diagnosis not present

## 2022-09-14 DIAGNOSIS — K5900 Constipation, unspecified: Secondary | ICD-10-CM | POA: Diagnosis not present

## 2022-09-14 LAB — GLUCOSE, CAPILLARY
Glucose-Capillary: 98 mg/dL (ref 70–99)
Glucose-Capillary: 153 mg/dL — ABNORMAL HIGH (ref 70–99)
Glucose-Capillary: 101 mg/dL — ABNORMAL HIGH (ref 70–99)
Glucose-Capillary: 163 mg/dL — ABNORMAL HIGH (ref 70–99)

## 2022-09-14 MED ORDER — TORSEMIDE 20 MG PO TABS: 40.0000 mg | ORAL_TABLET | Freq: Every day | ORAL | Status: DC

## 2022-09-14 MED ORDER — TORSEMIDE 20 MG PO TABS
20.0000 mg | ORAL_TABLET | Freq: Every day | ORAL | Status: DC
  Administered 2022-09-15: 20 mg via ORAL
  Filled 2022-09-14: qty 1

## 2022-09-14 MED ORDER — TORSEMIDE 20 MG PO TABS
20.0000 mg | ORAL_TABLET | Freq: Once | ORAL | Status: AC
  Administered 2022-09-14: 20 mg via ORAL
  Filled 2022-09-14: qty 1

## 2022-09-14 NOTE — Progress Notes (Addendum)
PROGRESS NOTE   Subjective/Complaints: Pt reports she has had increased swelling in her legs.  She is elevating her legs to help with this issue. No additional concerns.    ROS:   Pt denies SOB, abd pain, CP, N/V/C/D, and vision changes, +hip pain- continues to be severe  Objective:   No results found. Recent Labs    09/12/22 0435  WBC 4.9  HGB 7.8*  HCT 25.4*  PLT 131*    No results for input(s): "NA", "K", "CL", "CO2", "GLUCOSE", "BUN", "CREATININE", "CALCIUM" in the last 72 hours.   Intake/Output Summary (Last 24 hours) at 09/14/2022 1238 Last data filed at 09/14/2022 0913 Gross per 24 hour  Intake 600 ml  Output 104 ml  Net 496 ml         Physical Exam: Vital Signs Blood pressure (!) 146/45, pulse (!) 47, temperature 98.1 F (36.7 C), resp. rate 15, weight 78.2 kg, SpO2 92 %.    General: awake, alert, appropriate, BMI 32; lying in bed watching TV; NAD HENT: [place], AT , conjugate gaze; oropharynx moist CV: IIR, HR 68, no JVD Pulmonary: CTA B/L; no W/R/R- good air movement GI: soft, NT, ND, (+)BS Psychiatric: appropriate- interactive Neurological: Ox3  No clubbing, cyanosis. 2+ b/l LE edema. Pulses are 2+ Psych: Pt's affect is a little flat. Pt is cooperative Neuro:  Alert and oriented x4,follows commands, CN 2-12 intact  PE from prior encounters: Skin: Clean and intact without signs of breakdown R hip with 2 border foam dressings- CDI Sensation decreased to LT in b/l LE in stocking glove distribution, normal insight and judgement Strength 5/5 in b/l UE Strength 5/5 in LLE Strength 1-2/5 hip flexion and knee extension, 4/5 ankle PF and DF  FTN intact b/l Musculoskeletal:  Normal bulk and tone.  R hip appropriately tender, ecchymosis along R thigh Ambulating 3 feet minA GU: total A for pericare  Assessment/Plan: 1. Functional deficits which require 3+ hours per day of interdisciplinary therapy in  a comprehensive inpatient rehab setting. Physiatrist is providing close team supervision and 24 hour management of active medical problems listed below. Physiatrist and rehab team continue to assess barriers to discharge/monitor patient progress toward functional and medical goals  Care Tool:  Bathing    Body parts bathed by patient: Right arm, Left arm, Chest, Abdomen, Face   Body parts bathed by helper: Front perineal area, Buttocks, Right upper leg, Left upper leg, Right lower leg, Left lower leg     Bathing assist Assist Level: Supervision/Verbal cueing     Upper Body Dressing/Undressing Upper body dressing   What is the patient wearing?: Pull over shirt    Upper body assist Assist Level: Minimal Assistance - Patient > 75%    Lower Body Dressing/Undressing Lower body dressing      What is the patient wearing?: Pants     Lower body assist Assist for lower body dressing: Moderate Assistance - Patient 50 - 74%     Toileting Toileting    Toileting assist Assist for toileting: Moderate Assistance - Patient 50 - 74%     Transfers Chair/bed transfer  Transfers assist     Chair/bed transfer assist level: Minimal  Assistance - Patient > 75% Chair/bed transfer assistive device: Museum/gallery exhibitions officer assist      Assist level: Minimal Assistance - Patient > 75% Assistive device: Walker-platform Max distance: 47f   Walk 10 feet activity   Assist  Walk 10 feet activity did not occur: Safety/medical concerns        Walk 50 feet activity   Assist Walk 50 feet with 2 turns activity did not occur: Safety/medical concerns         Walk 150 feet activity   Assist Walk 150 feet activity did not occur: Safety/medical concerns         Walk 10 feet on uneven surface  activity   Assist Walk 10 feet on uneven surfaces activity did not occur: Safety/medical concerns         Wheelchair     Assist Is the patient using a  wheelchair?: Yes Type of Wheelchair: Manual    Wheelchair assist level: Dependent - Patient 0% Max wheelchair distance: >1571f   Wheelchair 50 feet with 2 turns activity    Assist        Assist Level: Dependent - Patient 0%   Wheelchair 150 feet activity     Assist      Assist Level: Dependent - Patient 0%   Blood pressure (!) 146/45, pulse (!) 47, temperature 98.1 F (36.7 C), resp. rate 15, weight 78.2 kg, SpO2 92 %.  Medical Problem List and Plan: 1. Functional deficits secondary to right intertrochanteric hip fracture after mechanical fall.  Status post intramedullary nailing 08/30/2022.  Weightbearing as tolerated, no ROM restrictions              -patient may shower, soap and water only             -ELOS/Goals: 14 days             -Sutures to be removed around 09/12/22             Continue CIR- PT and OT  Therapy notes reviewed 2.  Atrial fibrillation: Eliquis, toprol XL restarted, will increase to '25mg'$  HS             -antiplatelet therapy: Plavix 75 mg daily             - RLE duplex ordered 9/30 for pain, edema post-op - negative 3. Pain Management: Robaxin and oxycodone as needed. Ordered ice to hip three times per day for 15 minutes each. Increase scheduled tylenol to 1,'0000mg'$  TID.  4. Anxiety: continue Ativan 0.5 mg every 6 hours as needed anxiety.  Provide emotional support             -antipsychotic agents: N/A 5. Neuropsych/cognition: This patient is capable of making decisions on her own behalf. 6. Skin/Wound Care: Routine skin checks 7. Fluids/Electrolytes/Nutrition: Routine in and outs with follow-up chemistries 8.  Acute blood loss anemia. Hgb down to 7.6, repeat tomorrow 9.  AKI on CKD stage III.  Baseline creatinine 1.5-1.9.  Latest follow-up creatinine 1.80.  Follow-up renal services as needed.  Renal ultrasound negative. Currently at baseline, weight stable, will decrease torsemide back to '20mg'$  daily  -Recheck labs tomorrow 10.  Diabetes  mellitus.  Semglee 6 units nightly.  Sliding scale insulin. Check blood sugars before meals and at bedtime. Monitor closely as PO intake improves. Recommended avoiding added sugars and minimizing bread, pasta, and rice in her diet. [facility] HS sliding scale 10/8  Well controlled overall, continue current regimen Recent Labs    09/13/22 2036 09/14/22 0604 09/14/22 1146  GLUCAP 167* 98 153*      11.  History of seizure disorder.  Keppra 500 mg twice daily 12.Myelogenous leukemia.  In remission.  Follow-up outpatient with Dr. [name] 13.  Persistent atrial fibrillation. Discussed with cardiology holding low dose eliquis until XR to assess for hematoma.  Cardiac rate controlled 14.  CAD with stenting.  Continue Plavix.  No chest pain or shortness of breath 15.  Chronic diastolic congestive heart failure.  Echocardiogram 04/17/2022 ejection fraction 60 to 65%.  LOSARTAN and JARDIANCE currently on hold.   Resume as needed. On home torsemide '40mg'$  daily             - Restart torsemide '20mg'$               - weight increased, increase toresemide to '40mg'$  daily  10/4- Weight up another 1 kg- will con't Torsemide and if hasn't gone down by tomorrow, might need an increased dose.   10/8 weights trending up a little, increased leg swelling noted, she is currently on torsemide '20mg'$  daily, will give additional '20mg'$  dose today. Consider increasing to '40mg'$  daily tomorrow after labs complete. Filed Weights   09/12/22 0500 09/13/22 0500 09/14/22 0500  Weight: 77.9 kg 78.1 kg 78.2 kg     16.  History of uterine breast cancer.  Follow-up outpatient. 17.  Hyperlipidemia.  Lipitor '40mg'$  daily 18.  Constipation.  Senokot twice daily, miralax daily             -LBM 9/29-improved  -LBM 10/8-improved 19.  COPD with remote history of tobacco use.  Duoneb PRN. Check oxygen saturations every shift. Goal sat is 88-92%, adjusted order 20.  Acute urinary retention.  Foley catheter tube 9/25.   Continue Flomax.               -Consider voiding trial - DC foley trial 9/30 21. Indigestion             -Maalox prn 22. Low Vitamin D             -Ergocalciferol 50,000 units every 7 days for 7 doses. 23. Pancreas head mass. follows with Dr. Ardis Hughs 24. Obesity BMI 31.73-->32.09: provided dietary eduction. Increase torsemide given increase in weight.  25. 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  26. HTN: increase toprol XL to '25mg'$  HS    LOS: 9 days A FACE TO FACE EVALUATION WAS PERFORMED  Jennye Boroughs 09/14/2022, 12:38 PM

## 2022-09-14 NOTE — Progress Notes (Signed)
Occupational Therapy Session Note  Patient Details  Name: MAELA TAKEDA MRN: [id number] Date of Birth: 05/06/1941  Today's Date: 09/14/2022 OT Individual Time: 0205-0250 OT Individual Time Calculation (min): 45 min    Short Term Goals: Week 1:  OT Short Term Goal 1 (Week 1): Patient to perform transfer to [facility] with Mod A using LRAD OT Short Term Goal 2 (Week 1): Patient to perform LE dressing with AE as needed OT Short Term Goal 3 (Week 1): Patient to perform bathing with Mod A and AE as needed OT Short Term Goal 4 (Week 1): Patient to perform shower transfer with Mod A and LRAD Week 2:     Skilled Therapeutic Interventions/Progress Updates:    The pt was supine in bed with BLE elevated secondary to edema with ice pack on BLE.  The pt worked on [employer] simulated task in dressing using medium grade theraband to improve independence with task completion to reduce the burden of care for others, she was able to execute the task 1 initial cue and 1 physical demonstration and SBA.  The pt went on to complete UBB exercise using a medium grade theraband for shld flex, trumpets, and chest press 2 sets of 10 with rest breaks as needed to improve compliance.  The pt required 2 rest breaks and she was able to demonstrate effective carryover with 1 demonstration to improve UB strength to reduce the burden of care for others. The pt was able to complete towel exercise for shld flexion, horizontal abduction and shld rotation 1 set of 10 with rest breaks as needed. The pt required 1 rest break between exercises. The pt remained at bed LOF with BLE elevated and ice packs in place.  The bed side table and call light were both within reach and the bed alarm was activated.  The pt had no pain response at the time of treatment and all additional needs were addressed.   Therapy Documentation Precautions:  Precautions Precautions: Fall, Other (comment) Precaution Comments: monitor SpO2 to maintain >92% (up to 2L of O2 via  nasal cannula as needed), hx of seizure disorder, no R LE ROM restrictions Restrictions Weight Bearing Restrictions: Yes RLE Weight Bearing: Weight bearing as tolerated  Therapy/Group: Individual Therapy  Yvonne Kendall 09/14/2022, 2:58 PM

## 2022-09-14 NOTE — Progress Notes (Signed)
Physical Therapy Session Note  Patient Details  Name: [name] MRN: [id number] Date of Birth: 1941-06-16  Today's Date: 09/14/2022 PT Individual Time: 6606-0045 PT Individual Time Calculation (min): 35 min  and Today's Date: 09/14/2022 PT Missed Time: 10 Minutes Missed Time Reason: Pain  Short Term Goals: Week 2:  PT Short Term Goal 1 (Week 2): pt will transfer sit<>supine with min A consistantly PT Short Term Goal 2 (Week 2): Pt will perform sit<>stands using LRAD with min assist PT Short Term Goal 3 (Week 2): Pt will ambulate at least 41f using LRAD with min assist  Skilled Therapeutic Interventions/Progress Updates:      Therapy Documentation Precautions:  Precautions Precautions: Fall, Other (comment) Precaution Comments: monitor SpO2 to maintain >92% (up to 2L of O2 via nasal cannula as needed), hx of seizure disorder, no R LE ROM restrictions Restrictions Weight Bearing Restrictions: Yes RLE Weight Bearing: Weight bearing as tolerated  Pt received seated in w/c at bedisde with bilateral elevating leg rest donned. Pt reports increased bilateral LE edema and weeping of fluid from UE. Pt reports edema increases LE pain 9/10 with mobility. Pt requested to return to bed and PT provided asssistance. Pt CGA with sit to stand, stand pivot with R PFRW and lateral side steps toward head of bed. Pt requires mod A for LE management with bed mobility and supervision with bilateral rolling for doffing of pants and placement of bed pan. Pt continent of bladder and PT instructed pt to perform ankle pumps and elevated bilateral LE in bed for edema management. Pt left semi-reclined in bed with all needs in reach and alarm on.    Therapy/Group: Individual Therapy  SVerl DickerSVerl DickerPT, DPT  09/14/2022, 11:36 AM

## 2022-09-15 DIAGNOSIS — S72144S Nondisplaced intertrochanteric fracture of right femur, sequela: Secondary | ICD-10-CM | POA: Diagnosis not present

## 2022-09-15 DIAGNOSIS — S72144A Nondisplaced intertrochanteric fracture of right femur, initial encounter for closed fracture: Secondary | ICD-10-CM | POA: Diagnosis not present

## 2022-09-15 LAB — COMPREHENSIVE METABOLIC PANEL
ALT: 8 U/L (ref 0–44)
AST: 16 U/L (ref 15–41)
Albumin: 2.2 g/dL — ABNORMAL LOW (ref 3.5–5.0)
Alkaline Phosphatase: 116 U/L (ref 38–126)
Anion gap: 6 (ref 5–15)
BUN: 36 mg/dL — ABNORMAL HIGH (ref 8–23)
CO2: 28 mmol/L (ref 22–32)
Calcium: 7.5 mg/dL — ABNORMAL LOW (ref 8.9–10.3)
Chloride: 105 mmol/L (ref 98–111)
Creatinine, Ser: 1.81 mg/dL — ABNORMAL HIGH (ref 0.44–1.00)
GFR, Estimated: 28 mL/min — ABNORMAL LOW (ref >60)
Glucose, Bld: 107 mg/dL — ABNORMAL HIGH (ref 70–99)
Potassium: 4.3 mmol/L (ref 3.5–5.1)
Sodium: 139 mmol/L (ref 135–145)
Total Bilirubin: 0.6 mg/dL (ref 0.3–1.2)
Total Protein: 4.9 g/dL — ABNORMAL LOW (ref 6.5–8.1)

## 2022-09-15 LAB — CBC
HCT: 26.3 % — ABNORMAL LOW (ref 36.0–46.0)
Hemoglobin: 8.2 g/dL — ABNORMAL LOW (ref 12.0–15.0)
MCH: 30.4 pg (ref 26.0–34.0)
MCHC: 31.2 g/dL (ref 30.0–36.0)
MCV: 97.4 fL (ref 80.0–100.0)
Platelets: 180 10*3/uL (ref 150–400)
RBC: 2.7 MIL/uL — ABNORMAL LOW (ref 3.87–5.11)
RDW: 13.4 % (ref 11.5–15.5)
WBC: 5.4 10*3/uL (ref 4.0–10.5)
nRBC: 0 % (ref 0.0–0.2)

## 2022-09-15 LAB — GLUCOSE, CAPILLARY
Glucose-Capillary: 97 mg/dL (ref 70–99)
Glucose-Capillary: 129 mg/dL — ABNORMAL HIGH (ref 70–99)
Glucose-Capillary: 159 mg/dL — ABNORMAL HIGH (ref 70–99)
Glucose-Capillary: 69 mg/dL — ABNORMAL LOW (ref 70–99)
Glucose-Capillary: 117 mg/dL — ABNORMAL HIGH (ref 70–99)
Glucose-Capillary: 97 mg/dL (ref 70–99)

## 2022-09-15 LAB — MAGNESIUM: Magnesium: 1.9 mg/dL (ref 1.7–2.4)

## 2022-09-15 MED ORDER — METHOCARBAMOL 1000 MG/10ML IJ SOLN: 500.0000 mg | Freq: Four times a day (QID) | INTRAVENOUS | Status: DC | PRN

## 2022-09-15 MED ORDER — TORSEMIDE 20 MG PO TABS
20.0000 mg | ORAL_TABLET | Freq: Once | ORAL | Status: AC
  Administered 2022-09-15: 20 mg via ORAL
  Filled 2022-09-15: qty 1

## 2022-09-15 MED ORDER — MILK AND MOLASSES ENEMA
1.0000 | Freq: Once | RECTAL | Status: AC
  Administered 2022-09-15: 240 mL via RECTAL
  Filled 2022-09-15 (×2): qty 240

## 2022-09-15 MED ORDER — INSULIN GLARGINE-YFGN 100 UNIT/ML ~~LOC~~ SOLN
3.0000 [IU] | Freq: Once | SUBCUTANEOUS | Status: AC
  Administered 2022-09-15: 3 [IU] via SUBCUTANEOUS
  Filled 2022-09-15: qty 0.03

## 2022-09-15 MED ORDER — METHOCARBAMOL 500 MG PO TABS
500.0000 mg | ORAL_TABLET | ORAL | Status: DC | PRN
  Administered 2022-09-15 – 2022-09-30 (×29): 500 mg via ORAL
  Filled 2022-09-15 (×30): qty 1

## 2022-09-15 MED ORDER — TORSEMIDE 20 MG PO TABS
40.0000 mg | ORAL_TABLET | Freq: Every day | ORAL | Status: DC
  Administered 2022-09-16 – 2022-10-01 (×16): 40 mg via ORAL
  Filled 2022-09-15 (×16): qty 2

## 2022-09-15 NOTE — Progress Notes (Signed)
Occupational Therapy Session Note  Patient Details  Name: [name] MRN: [id number] Date of Birth: 12/04/1941  Today's Date: 09/15/2022 OT Individual Time: 8588-5027 OT Individual Time Calculation (min): 57 min    Short Term Goals: Week 1:  OT Short Term Goal 1 (Week 1): Patient to perform transfer to [facility] with Mod A using LRAD OT Short Term Goal 2 (Week 1): Patient to perform LE dressing with AE as needed OT Short Term Goal 3 (Week 1): Patient to perform bathing with Mod A and AE as needed OT Short Term Goal 4 (Week 1): Patient to perform shower transfer with Mod A and LRAD  Skilled Therapeutic Interventions/Progress Updates:  Skilled OT intervention completed with focus on activity tolerance, edema management, functional transfers, BUE endurance. Pt received seated in w/c, no initial c/o pain at rest, did have un-rated pain with transfers however was pre-medicated, with rest breaks and repositioning offered for pain reduction. On RA for duration of session with SPO2 WNL.  Transported dependently in w/c <> gym for energy conservation/pain management. Min A sit > stand using R PFRW, then min A stand pivot with pt sliding R foot to nustep seat. Mod A needed for LLE and max A needed for RLE management on/off foot pedals. Pt demonstrated mild anxiety with the mechanism of nustep however with education provided on purpose of the activity for light AROM with use of all 4 extremities for edema management and in prep for functional gait, pt tolerated well. Completed 10 mins on level 1 with all 4 extremities.   Min A sit > stand, then mod A stand pivot with difficulty facilitating RLE due to fatigue/pain with physical assist needed to slide R foot for transfer. Donned BLE leg rests for comfort.  Seated at table, with 2.5 pound wrist weights applied to each UE, pt completed 3 levels of pipe design for BUE endurance, as well as assisting therapist with cleaning pipe pieces. No difficulty  sequencing.  Back in room, pt remained seated in w/c, with BLE elevated, ice applied to R hip for pain reduction and all needs in reach at end of session.   Therapy Documentation Precautions:  Precautions Precautions: Fall, Other (comment) Precaution Comments: monitor SpO2 to maintain >92% (up to 2L of O2 via nasal cannula as needed), hx of seizure disorder, no R LE ROM restrictions Restrictions Weight Bearing Restrictions: Yes RLE Weight Bearing: Weight bearing as tolerated    Therapy/Group: Individual Therapy  Blase Mess, MS, OTR/L  09/15/2022, 12:10 PM

## 2022-09-15 NOTE — Progress Notes (Signed)
Physical Therapy Session Note  Patient Details  Name: [name] MRN: [id number] Date of Birth: 1940-12-25  Today's Date: 09/15/2022 PT Individual Time: [phone] and 1400-1446 PT Individual Time Calculation (min): 55 min  and 46 min Today's Date: 09/15/2022 PT Missed Time: 20 Minutes and 14 minutes Missed Time Reason: Other (Comment) (breathing treatment) and pain  Short Term Goals: Week 1:  PT Short Term Goal 1 (Week 1): Pt will perform supine<>sit with min assist consistently PT Short Term Goal 1 - Progress (Week 1): Met PT Short Term Goal 2 (Week 1): Pt will perform sit<>stands using LRAD with min assist PT Short Term Goal 2 - Progress (Week 1): Progressing toward goal PT Short Term Goal 3 (Week 1): Pt will perform bed<>chair transfers using LRAD with min assist PT Short Term Goal 3 - Progress (Week 1): Met PT Short Term Goal 4 (Week 1): Pt will ambulate at least 38f using LRAD with min assist (+2 for safety as needed) PT Short Term Goal 4 - Progress (Week 1): Progressing toward goal Week 2:  PT Short Term Goal 1 (Week 2): pt will transfer sit<>supine with min A consistantly PT Short Term Goal 2 (Week 2): Pt will perform sit<>stands using LRAD with min assist PT Short Term Goal 3 (Week 2): Pt will ambulate at least 286fusing LRAD with min assist  Skilled Therapeutic Interventions/Progress Updates:   Treatment Session 1 Received pt semi-reclined in bed with RN present setting pt up for breathing treatment. Allowed time for breathing treatment and upon returning, MD present for morning rounds - plan for enema and adjusting medication for LE edema. Pt agreeable to PT treatment and reported pain 6/10 in RLE - RN present to administer pain medication. Session with emphasis on functional mobility/transfers, dressing, generalized strengthening and endurance, dynamic standing balance/coordination, and gait training. Donned ted hose in supine with total A for edema management and pt  transferred supine<>sitting EOB from flat bed with bedrails and mod A for RLE management. Donned pants sitting EOB with max A and stood from elevated EOB with R PFRW and CGA and required max A to pull pants over hips - stand<>pivot bed<>WC with CGA. [facility] and combed hair with set up assist.   [facility]. Sit<>stand with R PFRW and CGA and pt ambulated 16f17f 1 and [id number] with R PFRW and CGA with close WC follow; however pt actually able to clear her feet from floor today instead of dragging/sliding them. Pt limited by pain mostly but also reporting mild SOB. Returned to room and concluded session with pt sitting in WC, needs within reach, and seatbelt alarm on. Pt left on RA with SPO2 >90% and RN aware. 20 minutes missed of skilled physical therapy due to receiving breathing treatment.   SPO2 At rest on RA: 90-93% After ambulating trial 1 on RA: 91% increasing to 94% After ambulating trial 2 on RA: 92%  Treatment Session 2 Received pt sitting in WC reporting "not doing good" stating that pt she had to be disimpacted earlier and c/o ted hose "cutting off circulation". When asked if pt had moved since last PT session, pt stated no - educated pt on importance of moving legs to avoid becoming stiff. Pt agreeable to PT treatment and reported pain 8/10 in RLE (premedicated). Session with emphasis on functional mobility/transfers, generalized strengthening and endurance, and standing balance/coordination. Pt performed the following exercises with  emphasis on LE strength/ROM for warm up: -LAQ 1x10 -hip flexion 1x10 bilaterally (AAROM on RLE) -hip adduction pillow squeezes x12 -heel raises 2x15 Pt performed WC mobility 160f using BUE and supervision to dayroom with emphasis on UE strength. Pt declined any ambulation or any further leg exercises but was agreeable to working on UE exercises and performed the following exercises  seated in WC: -bicep curls with 5lb dowel 2x10 -horizontal chest press with 2lb dowel 2x10 -overhead chest press with 2lb dowel 2x10 [facility] and requested to return to bed. Pt transferred WC<>bed stand<>pivot with R PFRW and min A> doffed pants in standing with max A and transferred sit<>supine with mod A for BLE management. Pt required assist to reposition in middle of bed, then removed ted hose and non-skid socks. Concluded session with pt semi-reclined in bed, needs within reach, and bed alarm on - preparing to receive enema. 14 minutes missed of skilled physical therapy due to pain.   SPO2 At rest on RA: 95%  Therapy Documentation Precautions:  Precautions Precautions: Fall, Other (comment) Precaution Comments: monitor SpO2 to maintain >92% (up to 2L of O2 via nasal cannula as needed), hx of seizure disorder, no R LE ROM restrictions Restrictions Weight Bearing Restrictions: Yes RLE Weight Bearing: Weight bearing as tolerated  Therapy/Group: Individual Therapy AAlfonse AlpersPT, DPT  09/15/2022, 6:57 AM

## 2022-09-15 NOTE — Progress Notes (Signed)
PROGRESS NOTE   Subjective/Complaints: Continues to feel constipated, agreeable to molasses enema today Finds the muscle relaxer to be helpful, she asks if its frequency can be increased   ROS:   Pt denies SOB, abd pain, CP, N/V/C/D, and vision changes, +hip pain- continues to be severe, +bilateral lower extremity swelling  Objective:   No results found. Recent Labs    09/15/22 0414  WBC 5.4  HGB 8.2*  HCT 26.3*  PLT 180   Recent Labs    09/15/22 0414  NA 139  K 4.3  CL 105  CO2 28  GLUCOSE 107*  BUN 36*  CREATININE 1.81*  CALCIUM 7.5*    Intake/Output Summary (Last 24 hours) at 09/15/2022 0940 Last data filed at 09/14/2022 1827 Gross per 24 hour  Intake 234 ml  Output --  Net 234 ml        Physical Exam: Vital Signs Blood pressure 137/72, pulse 60, temperature 97.6 F (36.4 C), resp. rate 17, weight 79.9 kg, SpO2 96 %.    General: awake, alert, appropriate, BMI 32; lying in bed watching TV; NAD HENT: [place], AT , conjugate gaze; oropharynx moist CV: IIR, HR 68, no JVD Pulmonary: CTA B/L; no W/R/R- good air movement GI: soft, NT, ND, (+)BS Psychiatric: appropriate- interactive Neurological: Ox3  No clubbing, cyanosis. 2+ b/l LE edema. Pulses are 2+ Psych: Pt's affect is a little flat. Pt is cooperative Neuro:  Alert and oriented x4,follows commands, CN 2-12 intact  PE from prior encounters: Skin: Clean and intact without signs of breakdown R hip with 2 border foam dressings- CDI Sensation decreased to LT in b/l LE in stocking glove distribution, normal insight and judgement Strength 5/5 in b/l UE Strength 5/5 in LLE Strength 1-2/5 hip flexion and knee extension, 4/5 ankle PF and DF  FTN intact b/l Musculoskeletal:  Normal bulk and tone.  R hip appropriately tender, ecchymosis along R thigh Ambulating 3 feet minA Bilateral lower extremity 2+ edema GU: total A for  pericare  Assessment/Plan: 1. Functional deficits which require 3+ hours per day of interdisciplinary therapy in a comprehensive inpatient rehab setting. Physiatrist is providing close team supervision and 24 hour management of active medical problems listed below. Physiatrist and rehab team continue to assess barriers to discharge/monitor patient progress toward functional and medical goals  Care Tool:  Bathing    Body parts bathed by patient: Right arm, Left arm, Chest, Abdomen, Face   Body parts bathed by helper: Front perineal area, Buttocks, Right upper leg, Left upper leg, Right lower leg, Left lower leg     Bathing assist Assist Level: Supervision/Verbal cueing     Upper Body Dressing/Undressing Upper body dressing   What is the patient wearing?: Pull over shirt    Upper body assist Assist Level: Minimal Assistance - Patient > 75%    Lower Body Dressing/Undressing Lower body dressing      What is the patient wearing?: Pants     Lower body assist Assist for lower body dressing: Moderate Assistance - Patient 50 - 74%     Toileting Toileting    Toileting assist Assist for toileting: Moderate Assistance - Patient 50 - 74%  Transfers Chair/bed transfer  Transfers assist     Chair/bed transfer assist level: Minimal Assistance - Patient > 75% Chair/bed transfer assistive device: Museum/gallery exhibitions officer assist      Assist level: Minimal Assistance - Patient > 75% Assistive device: Walker-platform Max distance: 74f   Walk 10 feet activity   Assist  Walk 10 feet activity did not occur: Safety/medical concerns        Walk 50 feet activity   Assist Walk 50 feet with 2 turns activity did not occur: Safety/medical concerns         Walk 150 feet activity   Assist Walk 150 feet activity did not occur: Safety/medical concerns         Walk 10 feet on uneven surface  activity   Assist Walk 10 feet on uneven  surfaces activity did not occur: Safety/medical concerns         Wheelchair     Assist Is the patient using a wheelchair?: Yes Type of Wheelchair: Manual    Wheelchair assist level: Dependent - Patient 0% Max wheelchair distance: >1525f   Wheelchair 50 feet with 2 turns activity    Assist        Assist Level: Dependent - Patient 0%   Wheelchair 150 feet activity     Assist      Assist Level: Dependent - Patient 0%   Blood pressure 137/72, pulse 60, temperature 97.6 F (36.4 C), resp. rate 17, weight 79.9 kg, SpO2 96 %.  Medical Problem List and Plan: 1. Functional deficits secondary to right intertrochanteric hip fracture after mechanical fall.  Status post intramedullary nailing 08/30/2022.  Weightbearing as tolerated, no ROM restrictions              -patient may shower, soap and water only             -ELOS/Goals: 14 days             -Sutures to be removed around 09/12/22             Continue CIR- PT and OT  Therapy notes reviewed 2.  Atrial fibrillation: Eliquis, toprol XL restarted, will increase to '25mg'$  HS             -antiplatelet therapy: Plavix 75 mg daily             - RLE duplex ordered 9/30 for pain, edema post-op - negative 3. Pain Management: Robaxin and oxycodone as needed. Increase Robaxin frequency to q4H prn. Ordered ice to hip three times per day for 15 minutes each. Increase scheduled tylenol to 1,'0000mg'$  TID.  4. Anxiety: continue Ativan 0.5 mg every 6 hours as needed anxiety.  Provide emotional support             -antipsychotic agents: N/A 5. Neuropsych/cognition: This patient is capable of making decisions on her own behalf. 6. Skin/Wound Care: Routine skin checks 7. Fluids/Electrolytes/Nutrition: Routine in and outs with follow-up chemistries 8.  Acute blood loss anemia. Hgb down to 7.6, repeat tomorrow 9.  AKI on CKD stage III.  Baseline creatinine 1.5-1.9.  Latest follow-up creatinine 1.80.  Follow-up renal services as needed.   Renal ultrasound negative. Currently at baseline, weight stable, will decrease torsemide back to '20mg'$  daily  -Recheck labs tomorrow 10.  Diabetes mellitus.  Semglee 6 units nightly.  Sliding scale insulin. Check blood sugars before meals and at bedtime. Monitor closely as PO intake improves. Recommended avoiding added sugars and  minimizing bread, pasta, and rice in her diet. Decrease CBG checks to [facility] and d/c HS sliding scale 10/8 Well controlled overall, continue current regimen Recent Labs    09/14/22 1639 09/14/22 2043 09/15/22 0551  GLUCAP 101* 163* 97     11.  History of seizure disorder.  Keppra 500 mg twice daily 12.Myelogenous leukemia.  In remission.  Follow-up outpatient with Dr. [name] 13.  Persistent atrial fibrillation. Discussed with cardiology holding low dose eliquis until XR to assess for hematoma.  Cardiac rate controlled 14.  CAD with stenting.  Continue Plavix.  No chest pain or shortness of breath 15.  Chronic diastolic congestive heart failure.  Echocardiogram 04/17/2022 ejection fraction 60 to 65%.  LOSARTAN and JARDIANCE currently on hold.   Resume as needed. Increase torsemide back to home dose of '40mg'$  daily.              - weight increased, increase toresemide to '40mg'$  daily  10/4- Weight up another 1 kg- will con't Torsemide and if hasn't gone down by tomorrow, might need an increased dose.   10/8 weights trending up a little, increased leg swelling noted, she is currently on torsemide '20mg'$  daily, will give additional '20mg'$  dose today. Consider increasing to '40mg'$  daily tomorrow after labs complete. Filed Weights   09/13/22 0500 09/14/22 0500 09/15/22 0500  Weight: 78.1 kg 78.2 kg 79.9 kg     16.  History of uterine breast cancer.  Follow-up outpatient. 17.  Hyperlipidemia.  Lipitor '40mg'$  daily 18.  Constipation.  Senokot twice daily, miralax daily. Molasses enema on 10/9 ordered.  19.  COPD with remote history of tobacco use.  Duoneb PRN. Check oxygen saturations  every shift. Goal sat is 88-92%, adjusted order 20.  Acute urinary retention.  Foley catheter tube 9/25.  Continue Flomax.               -Consider voiding trial - DC foley trial 9/30 21. Indigestion             -Maalox prn 22. Low Vitamin D             -Ergocalciferol 50,000 units every 7 days for 7 doses. 23. Pancreas head mass. follows with Dr. Ardis Hughs 24. Obesity BMI 31.73-->32.09: provided dietary eduction. Increase torsemide given increase in weight.  25. 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  26. HTN: increase toprol XL to '25mg'$  HS    LOS: 10 days A FACE TO FACE EVALUATION WAS PERFORMED  [name] P [name] 09/15/2022, 9:40 AM

## 2022-09-15 NOTE — Progress Notes (Signed)
Occupational Therapy Note  Patient Details  Name: [name] MRN: [id number] Date of Birth: [birth year]  The following the equipment is recommended by occupational therapy to increase pt's ability to perform ADL and decrease burden of care:  **Wide BSC, due to body habitus and to reduce pain on RLE during toileting needs that is significantly increased with just a standard BSC **tub transfer bench to facilitate safe transfers in the bathroom in and out of shower.    Blase Mess, MS, OTR/L  09/15/2022, 12:32 PM

## 2022-09-15 NOTE — Progress Notes (Signed)
Occupational Therapy Weekly Progress Note  Patient Details  Name: [name] MRN: [id number] Date of Birth: 1941/11/28  Beginning of progress report period: September 07, 2022 End of progress report period: September 15, 2022  Patient has met 2 of 4 short term goals. Pt is making slow but steady progress towards LTGs. Pt is at a min A stand pivot transfer level using R PFRW with intermittent physical assist needed for R foot clearance at times. Is able to bathe at an overall mod A level, dress with mod-max A at the sit > stand level and requires mod assist for toileting tasks. Pt has progressed her respiratory endurance to now tolerating activity on RA with SPO2 WNLs vs needed supplemental oxygen. Pt continues to be demonstrate high levels of pain, edema in RLE > LLE, and decreased cardiorespiratory endurance, all which limit her ability to complete BADLs and functional transfers at the CGA-supervision level. Pt's husband is disabled and will have limited physical assist at d/c, therefore would benefit from continued OT to maximize pt's independence.  Patient continues to demonstrate the following deficits: muscle weakness, decreased cardiorespiratoy endurance and decreased oxygen support, decreased coordination, and decreased standing balance and decreased balance strategies and therefore will continue to benefit from skilled OT intervention to enhance overall performance with BADL, iADL, and Reduce care partner burden.  Patient progressing toward long term goals..  Continue plan of care.  OT Short Term Goals Week 1:  OT Short Term Goal 1 (Week 1): Patient to perform transfer to [facility] with Mod A using LRAD OT Short Term Goal 1 - Progress (Week 1): Met OT Short Term Goal 2 (Week 1): Patient to perform LE dressing with AE as needed OT Short Term Goal 2 - Progress (Week 1): Met OT Short Term Goal 3 (Week 1): Patient to perform bathing with Mod A and AE as needed OT Short Term Goal 3 - Progress (Week 1):  Not met OT Short Term Goal 4 (Week 1): Patient to perform shower transfer with Mod A and LRAD OT Short Term Goal 4 - Progress (Week 1): Not met Week 2:  OT Short Term Goal 1 (Week 2): Patient to perform bathing with Mod A and AE as needed OT Short Term Goal 2 (Week 2): Patient to perform bathing with Mod A and AE as needed OT Short Term Goal 3 (Week 2): Pt will demonstrate increased AROM of RLE /R foot clearance during toilet transfers with no physical assist needed for RLE management    Blase Mess, MS, OTR/L  09/15/2022, 12:23 PM

## 2022-09-15 NOTE — Progress Notes (Signed)
Patient ID: [name], female   DOB: December 05, 1941, 81 y.o.   MRN: [id number] Met with the patient to review current situation, rehab process, plan of care pending d/c 09/24/22. Discussed secondary risks including HTN, HLD and DM with HF, CKD and A-fib. Reviewed medications and dietary modification recommendations. Discussed pain is managed wti prns, having trouble moving right leg. Today was the first day post op that she was able to actually lift and move the limb. Problems with constipation post op; on several po medications, enema pending therapy today.  Continue to follow along to address educational needs to facilitate preparation for discharge home. HAs 2 ste without rails and spouse in the home but unable to assist. Margarito Liner

## 2022-09-15 NOTE — Progress Notes (Signed)
Received call from [place] reporting blood sugar 69, lab work reviewed, note was reviewed. She was given a snack. Spoke with pharmacist, Hs insulin Semglee, half dose ordered. [name] verbalizes understanding. We will continue to monitor.

## 2022-09-16 DIAGNOSIS — S72144S Nondisplaced intertrochanteric fracture of right femur, sequela: Secondary | ICD-10-CM | POA: Diagnosis not present

## 2022-09-16 DIAGNOSIS — S72144A Nondisplaced intertrochanteric fracture of right femur, initial encounter for closed fracture: Secondary | ICD-10-CM | POA: Diagnosis not present

## 2022-09-16 LAB — GLUCOSE, CAPILLARY
Glucose-Capillary: 84 mg/dL (ref 70–99)
Glucose-Capillary: 85 mg/dL (ref 70–99)
Glucose-Capillary: 174 mg/dL — ABNORMAL HIGH (ref 70–99)
Glucose-Capillary: 163 mg/dL — ABNORMAL HIGH (ref 70–99)
Glucose-Capillary: 196 mg/dL — ABNORMAL HIGH (ref 70–99)

## 2022-09-16 MED ORDER — INSULIN GLARGINE-YFGN 100 UNIT/ML ~~LOC~~ SOLN
3.0000 [IU] | Freq: Every day | SUBCUTANEOUS | Status: DC
  Filled 2022-09-16: qty 0.03

## 2022-09-16 NOTE — Progress Notes (Signed)
Occupational Therapy Session Note  Patient Details  Name: [name] MRN: [id number] Date of Birth: Apr 07, 1941  Today's Date: 09/16/2022 OT Individual Time: 2355-7322 OT Individual Time Calculation (min): 72 min    Short Term Goals: Week 2:  OT Short Term Goal 1 (Week 2): Patient to perform bathing with Mod A and AE as needed OT Short Term Goal 2 (Week 2): Patient to perform bathing with Mod A and AE as needed OT Short Term Goal 3 (Week 2): Pt will demonstrate increased AROM of RLE /R foot clearance during toilet transfers with no physical assist needed for RLE management  Skilled Therapeutic Interventions/Progress Updates:  Skilled OT intervention completed with focus on d/c planning, activity tolerance, ADL retraining. Pt received seated in w/c, reporting 6/10 pain in R hip with movement vs at rest. Declined need of meds at this time. Therapist offered rest breaks and repositioning for pain reduction throughout.  Pt received with [place] on on 2L, with pt reporting she felt SOB but upon vitals assessment was at 95% SPO2 as pt has weaned off supplemental O2 in the past few days. Pt indicated she had breathing treatment yesterday but despite the number on the dynamap struggles with breathing. Therapist educated pt on COPD and CHF diagnoses from her [place] that influence her breathing abilities. Able to remove supplemental O2, with pt on RA for duration/at conclusion of session with SPO2 > 92%.  Discussed in detail with pt her progress of meeting 2/4 STGs, and options/preferences for discharge. Education provided on SNF and HH therapy services/purposes.  Pt reported the sudden urge to void but inability to hold her void. Incontinent of heavy void upon inspection. Completed toileting with mod A sit > stand using R PFRW, then total A doffing of brief. Able to manage peri-areas with CGA for balance in stance, then threaded brief total A for time, with pt able to donn over hips with min A. Overall min A  at the sit > stand level needed for donning pants with assist only for threading BLE. Would benefit from use of reacher. Doffed grip socks and donned tennis shoes found by therapist in drawers, with education provided on benefits of wearing shoe for pain reduction and proper body mechanics with gait vs flat footed. Doffed/donned shirt with set up A.   In hallway, pt completed min A sit > stand with use of R PFRW, then ambulated 9 ft at CGA level. Verbal cues only, needed for weight shifting to LLE and LUE for offloading RLE for full foot clearance. Pt did demo SOB however with pursed lip breathing strategies throughout gait and slower pace, pt was able to continue. Pain reported with further gait, therefore had to rest with pain subsiding. Pt may benefit from use of L PFRW vs R to assist with weight shifting to the L with more UE support for further RLE clearance due to present weakness.  Pt remained seated in w/c, ice applied to R hip for pain reduction, and all needs met at end of session.   Therapy Documentation Precautions:  Precautions Precautions: Fall, Other (comment) Precaution Comments: monitor SpO2 to maintain >92% (up to 2L of O2 via nasal cannula as needed), hx of seizure disorder, no R LE ROM restrictions Restrictions Weight Bearing Restrictions: Yes RLE Weight Bearing: Weight bearing as tolerated    Therapy/Group: Individual Therapy  Blase Mess, MS, OTR/L  09/16/2022, 12:34 PM

## 2022-09-16 NOTE — Progress Notes (Signed)
Physical Therapy Session Note  Patient Details  Name: [name] MRN: [id number] Date of Birth: Oct 20, 1941  Today's Date: 09/16/2022 PT Individual Time: 0815-0910 and 6734-1937 PT Individual Time Calculation (min): 55 min and 55 min  Short Term Goals: Week 1:  PT Short Term Goal 1 (Week 1): Pt will perform supine<>sit with min assist consistently PT Short Term Goal 1 - Progress (Week 1): Met PT Short Term Goal 2 (Week 1): Pt will perform sit<>stands using LRAD with min assist PT Short Term Goal 2 - Progress (Week 1): Progressing toward goal PT Short Term Goal 3 (Week 1): Pt will perform bed<>chair transfers using LRAD with min assist PT Short Term Goal 3 - Progress (Week 1): Met PT Short Term Goal 4 (Week 1): Pt will ambulate at least 78ft using LRAD with min assist (+2 for safety as needed) PT Short Term Goal 4 - Progress (Week 1): Progressing toward goal Week 2:  PT Short Term Goal 1 (Week 2): pt will transfer sit<>supine with min A consistantly PT Short Term Goal 2 (Week 2): Pt will perform sit<>stands using LRAD with min assist PT Short Term Goal 3 (Week 2): Pt will ambulate at least 6ft using LRAD with min assist  Skilled Therapeutic Interventions/Progress Updates:   Treatment Session 1 Received pt semi-reclined in bed, pt agreeable to PT treatment, and reported pain in RLE (unrated) - RN administered medications during session. Pt reported having increased pain after therapy yesterday and not sleeping well last night. Session with emphasis on functional mobility/transfers, generalized strengthening and endurance, and toileting. Donned thigh high ted hose and non-skid socks in supine dependently for edema management and pt performed the following exercises with emphasis on ROM prior to getting OOB: -heel slides x10 on LLE and x8 on RLE using plastic bag to decrease friction - AAROM on RLE -hip abduction x10 bilaterally using plastic bag to decrease friction - AAROM on RLE -ankle  circles x20 clockwise/counterclockwise bilaterally  Pt required multiple rest breaks in between exercises due to fatigue, SOB, and pain. RN arrived to administer medications and briefly discussed equipment for D/C. Pt reports not having WC and will need one. Pt requesting to try smaller WC - located 18x16 manual WC for pt to try. Pt also reported having 2 STE at front entrance of home but reported having a ramp in the back. MD arrived for morning rounds and pt requested to toilet. Pt transferred supine<>sitting EOB from flat bed with light mod A for RLE management with heavy reliance on bedrails. Pt transferred bed<>bedside commode with R PFRW and CGA and required max A to doff brief. Pt left sitting on commode in care of NT due to time restrictions.   SPO2 At rest on RA: 94%  Treatment Session 2 Received pt sitting in WC, pt agreeable to PT treatment, and stated "I'm okay" when asked current pain level. Session with emphasis on functional mobility/transfers, generalized strengthening and endurance, and gait training. Took measurements to fit pt for 20x16 manual WC and notified CSW. Pt transported to/from room in [facility] depdendelty for time management and energy conservation purposes. Switched platform over to L side of RW and pt stood from [facility] with min A and pt ambulated 73ft with L PFRW and CGA. Pt was able to lift RLE from ground. Pt more motivated this afternoon expressing to therapist she really doesn't want to go to a SNF. Took extended seated rest break and agreed to attempt ambulation without platform. Stood from [facility] with  RW and CGA and ambulated additional 9ft with RW and CGA - pt reported it was much more difficulty but tolerable. Pt requested to work on UE exercises to avoid "overdoing it" in her legs. Pt performed the following exercises with supervision and verbal cues for technique: -trunk rotations with 4lb medicine ball 2x8 bilaterally -rows with yellow TB 2x10 -bicep curls with 6.5lb dowel  1x15 Returned to room and concluded session with pt sitting in WC, needs within reach, and seatbelt alarm on.   SPO2 After ambulating trial 1 and 2: 91%  Therapy Documentation Precautions:  Precautions Precautions: Fall, Other (comment) Precaution Comments: monitor SpO2 to maintain >92% (up to 2L of O2 via nasal cannula as needed), hx of seizure disorder, no R LE ROM restrictions Restrictions Weight Bearing Restrictions: Yes RLE Weight Bearing: Weight bearing as tolerated  Therapy/Group: Individual Therapy Alfonse Alpers PT, DPT  09/16/2022, 6:55 AM

## 2022-09-16 NOTE — Progress Notes (Signed)
PROGRESS NOTE   Subjective/Complaints: Constipation improved with molasses enema Pain is better controlled Swelling is better controlled Decrease insulin given her CBGs being low  ROS:   Pt denies SOB, abd pain, CP, N/V/C/D, and vision changes, +hip pain- continues to be severe, +bilateral lower extremity swelling, constipated improved  Objective:   No results found. Recent Labs    09/15/22 0414  WBC 5.4  HGB 8.2*  HCT 26.3*  PLT 180   Recent Labs    09/15/22 0414  NA 139  K 4.3  CL 105  CO2 28  GLUCOSE 107*  BUN 36*  CREATININE 1.81*  CALCIUM 7.5*    Intake/Output Summary (Last 24 hours) at 09/16/2022 1233 Last data filed at 09/16/2022 0800 Gross per 24 hour  Intake 476 ml  Output --  Net 476 ml        Physical Exam: Vital Signs Blood pressure (!) 154/62, pulse 75, temperature 97.9 F (36.6 C), resp. rate 16, weight 77.1 kg, SpO2 96 %.    General: awake, alert, appropriate, BMI 32; lying in bed watching TV; NAD HENT: [place], AT , conjugate gaze; oropharynx moist CV: IIR, HR 68, no JVD Pulmonary: CTA B/L; no W/R/R- good air movement GI: soft, NT, ND, (+)BS Psychiatric: appropriate- interactive Neurological: Ox3  No clubbing, cyanosis. 2+ b/l LE edema. Pulses are 2+ Psych: Pt's affect is a little flat. Pt is cooperative Neuro:  Alert and oriented x4,follows commands, CN 2-12 intact  PE from prior encounters: Skin: Clean and intact without signs of breakdown R hip with 2 border foam dressings- CDI Sensation decreased to LT in b/l LE in stocking glove distribution, normal insight and judgement Strength 5/5 in b/l UE Strength 5/5 in LLE Strength 1-2/5 hip flexion and knee extension, 4/5 ankle PF and DF  FTN intact b/l Musculoskeletal:  Normal bulk and tone.  R hip appropriately tender, ecchymosis along R thigh Ambulating 3 feet minA Bilateral lower extremity 2+ edema GU: total A for pericare,  continent  Assessment/Plan: 1. Functional deficits which require 3+ hours per day of interdisciplinary therapy in a comprehensive inpatient rehab setting. Physiatrist is providing close team supervision and 24 hour management of active medical problems listed below. Physiatrist and rehab team continue to assess barriers to discharge/monitor patient progress toward functional and medical goals  Care Tool:  Bathing    Body parts bathed by patient: Right arm, Left arm, Chest, Abdomen, Face   Body parts bathed by helper: Front perineal area, Buttocks, Right upper leg, Left upper leg, Right lower leg, Left lower leg     Bathing assist Assist Level: Supervision/Verbal cueing     Upper Body Dressing/Undressing Upper body dressing   What is the patient wearing?: Pull over shirt    Upper body assist Assist Level: Set up assist    Lower Body Dressing/Undressing Lower body dressing      What is the patient wearing?: Pants, Incontinence brief     Lower body assist Assist for lower body dressing: Minimal Assistance - Patient > 75%     Toileting Toileting    Toileting assist Assist for toileting: Minimal Assistance - Patient > 75%     Transfers  Chair/bed transfer  Transfers assist     Chair/bed transfer assist level: Contact Guard/Touching assist Chair/bed transfer assistive device: Museum/gallery exhibitions officer assist      Assist level: Contact Guard/Touching assist Assistive device: Walker-platform Max distance: 77f   Walk 10 feet activity   Assist  Walk 10 feet activity did not occur: Safety/medical concerns        Walk 50 feet activity   Assist Walk 50 feet with 2 turns activity did not occur: Safety/medical concerns         Walk 150 feet activity   Assist Walk 150 feet activity did not occur: Safety/medical concerns         Walk 10 feet on uneven surface  activity   Assist Walk 10 feet on uneven surfaces activity did  not occur: Safety/medical concerns         Wheelchair     Assist Is the patient using a wheelchair?: Yes Type of Wheelchair: Manual    Wheelchair assist level: Dependent - Patient 0% Max wheelchair distance: >1542f   Wheelchair 50 feet with 2 turns activity    Assist        Assist Level: Dependent - Patient 0%   Wheelchair 150 feet activity     Assist      Assist Level: Dependent - Patient 0%   Blood pressure (!) 154/62, pulse 75, temperature 97.9 F (36.6 C), resp. rate 16, weight 77.1 kg, SpO2 96 %.  Medical Problem List and Plan: 1. Functional deficits secondary to right intertrochanteric hip fracture after mechanical fall.  Status post intramedullary nailing 08/30/2022.  Weightbearing as tolerated, no ROM restrictions              -patient may shower, soap and water only             -ELOS/Goals: 14 days             -Sutures to be removed around 09/12/22            Continue CIR- PT and OT  Therapy notes reviewed 2.  Atrial fibrillation: continue Eliquis, toprol XL restarted, will increase to '25mg'$  HS             -antiplatelet therapy: Plavix 75 mg daily             - RLE duplex ordered 9/30 for pain, edema post-op - negative 3. Pain Management: Robaxin and oxycodone as needed. Increase Robaxin frequency to q4H prn. Ordered ice to hip three times per day for 15 minutes each. Increase scheduled tylenol to 1,'0000mg'$  TID.  4. Anxiety: continue Ativan 0.5 mg every 6 hours as needed anxiety.  Provide emotional support             -antipsychotic agents: N/A 5. Neuropsych/cognition: This patient is capable of making decisions on her own behalf. 6. Skin/Wound Care: Routine skin checks 7. Fluids/Electrolytes/Nutrition: Routine in and outs with follow-up chemistries 8.  Acute blood loss anemia. Hgb down to 7.6, repeat tomorrow 9.  AKI on CKD stage III.  Baseline creatinine 1.5-1.9.  Latest follow-up creatinine 1.80.  Follow-up renal services as needed.  Renal  ultrasound negative. Currently at baseline, weight stable, will decrease torsemide back to '20mg'$  daily  -Recheck labs tomorrow 10.  Diabetes mellitus. Decrease semglee to 3 units nightly.  Sliding scale insulin. Check blood sugars before meals and at bedtime. Monitor closely as PO intake improves. Recommended avoiding added sugars and minimizing bread, pasta, and rice  in her diet. Decrease CBG checks to [facility] and d/c HS sliding scale 10/8 Well controlled overall, continue current regimen Recent Labs    09/16/22 0557 09/16/22 0630 09/16/22 1125  GLUCAP 84 85 174*     11.  History of seizure disorder.  continue Keppra 500 mg twice daily 12.Myelogenous leukemia.  In remission.  Follow-up outpatient with Dr. [name] 13.  Persistent atrial fibrillation. Discussed with cardiology holding low dose eliquis until XR to assess for hematoma.  Cardiac rate controlled 14.  CAD with stenting.  Continue Plavix.  No chest pain or shortness of breath 15.  Chronic diastolic congestive heart failure.  Echocardiogram 04/17/2022 ejection fraction 60 to 65%.  LOSARTAN and JARDIANCE currently on hold.   Resume as needed. Increase torsemide back to home dose of '40mg'$  daily. Discussed with patient. Weights now down.  Filed Weights   09/14/22 0500 09/15/22 0500 09/16/22 0500  Weight: 78.2 kg 79.9 kg 77.1 kg     16.  History of uterine breast cancer.  Follow-up outpatient. 17.  Hyperlipidemia.  Lipitor '40mg'$  daily 18.  Constipation.  Senokot twice daily, miralax daily. Molasses enema on 10/9 ordered.  19.  COPD with remote history of tobacco use.  Duoneb PRN. Check oxygen saturations every shift. Goal sat is 88-92%, adjusted order 20.  Acute urinary retention.  Foley catheter tube 9/25. Continue Flomax.               -Consider voiding trial - DC foley trial 9/30 21. Indigestion             -Maalox prn 22. Low Vitamin D             -continue Ergocalciferol 50,000 units every 7 days for 7 doses. 23. Pancreas head mass.  follows with Dr. Ardis Hughs 24. Obesity BMI 31.73-->32.09: provided dietary eduction. Increase torsemide given increase in weight.  25. 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  26. HTN: increase toprol XL to '25mg'$  HS    LOS: 11 days A FACE TO FACE EVALUATION WAS PERFORMED  Clide Deutscher Gussie Murton 09/16/2022, 12:33 PM

## 2022-09-17 DIAGNOSIS — S72144A Nondisplaced intertrochanteric fracture of right femur, initial encounter for closed fracture: Secondary | ICD-10-CM

## 2022-09-17 DIAGNOSIS — S72144S Nondisplaced intertrochanteric fracture of right femur, sequela: Secondary | ICD-10-CM | POA: Diagnosis not present

## 2022-09-17 LAB — GLUCOSE, CAPILLARY
Glucose-Capillary: 163 mg/dL — ABNORMAL HIGH (ref 70–99)
Glucose-Capillary: 197 mg/dL — ABNORMAL HIGH (ref 70–99)
Glucose-Capillary: 222 mg/dL — ABNORMAL HIGH (ref 70–99)
Glucose-Capillary: 198 mg/dL — ABNORMAL HIGH (ref 70–99)

## 2022-09-17 MED ORDER — MELATONIN 3 MG PO TABS
3.0000 mg | ORAL_TABLET | Freq: Every day | ORAL | Status: DC
  Administered 2022-09-17 – 2022-09-30 (×14): 3 mg via ORAL
  Filled 2022-09-17 (×14): qty 1

## 2022-09-17 MED ORDER — OXYCODONE HCL 5 MG PO TABS
10.0000 mg | ORAL_TABLET | Freq: Every day | ORAL | Status: DC
  Administered 2022-09-17 – 2022-10-01 (×15): 10 mg via ORAL
  Filled 2022-09-17 (×17): qty 2

## 2022-09-17 MED ORDER — VITAMIN C 500 MG PO TABS
1000.0000 mg | ORAL_TABLET | Freq: Every day | ORAL | Status: DC
  Administered 2022-09-17 – 2022-10-01 (×15): 1000 mg via ORAL
  Filled 2022-09-17 (×15): qty 2

## 2022-09-17 MED ORDER — INSULIN GLARGINE-YFGN 100 UNIT/ML ~~LOC~~ SOLN
3.0000 [IU] | Freq: Every day | SUBCUTANEOUS | Status: DC
  Administered 2022-09-17: 3 [IU] via SUBCUTANEOUS
  Filled 2022-09-17 (×3): qty 0.03

## 2022-09-17 NOTE — Progress Notes (Signed)
Occupational Therapy Session Note  Patient Details  Name: [name] MRN: [id number] Date of Birth: Nov 27, 1941  Today's Date: 09/17/2022 OT Individual Time: 0800-0900 & 6301-6010 OT Individual Time Calculation (min): 60 min & 75 min   Short Term Goals: Week 2:  OT Short Term Goal 1 (Week 2): Patient to perform bathing with Mod A and AE as needed OT Short Term Goal 2 (Week 2): Patient to perform bathing with Mod A and AE as needed OT Short Term Goal 3 (Week 2): Pt will demonstrate increased AROM of RLE /R foot clearance during toilet transfers with no physical assist needed for RLE management  Skilled Therapeutic Interventions/Progress Updates:  Session 1 Skilled OT intervention completed with focus on ADL retraining, functional transfers and pain management education. Pt received upright in bed, no c/o pain at rest, however upon mobility indicated 6/10 pain in R hip. Therapist re-educated pt on importance of pre-medicating to manage pain in prep for therapies to maximize participation and therapy outcomes. Pt agreeable to call nurse in for medication, and to save the stronger meds for about 30 mins prior to PT session to prepare for ambulation which is what increases her pain the most. Nurse administered meds at start of session.  Completed bed mobility with use of bed features, and min A for RLE only. CGA sit > stand using RW, then CGA stand pivot to w/c with no assist for RLE clearance this session. Seated at sink, pt completed UB bathing with set up A, LB bathing with use of LH sponge supervision for BLE and CGA for peri-areas. Upon stance, pt indicated urge to void. Completed stand pivot at CGA using RW to [facility], continent of void. Min A sit > stand using RW then min A for toileting needs with therapist pre-threading brief and assisting with pulling them up. Stand pivot to w/c at same assist, then donned thigh high TEDS with mod A. Therapist provided education on use of adaptive reacher for  threading of BLE, with pt able to return demonstrate donning of pants at the sit > stand level with supervision today! Donned shoes with total A for time. Pt remained seated in w/c, with ice applied to R hip for pain reduction and all needs met at end of session.  O2 sat On RA, 94% SPO2, remained for duration of session with pursed lip breathing upon SOB  Session 2 Skilled OT intervention completed with focus on self-care needs, ambulatory transfers, and edema management/AROM of RLE. Pt received seated in w/c, un-rated pain in R hip with movement, pre-medicated, with pt indicating that pain is much more tolerable this session having meds prior. Therapist offered rest breaks and repositioning for pain reduction throughout. Received and remained on RA, with SPO2 WNL throughout session.  Per pt, she walked 15 ft with PT which was close to her goal she made with this OT in AM session, therefore therapist utilized hair washing (due to inability to bathe in shower 2/2 port catch) as reward/motivation to continue pushing hard with ambulation. Completed at total A for time using hair washing tray. Pt utilized hair dryer with set up A for BUE endurance to dry hair.  Completed min A sit > stand using RW, then CGA ambulatory toilet transfer about 12 ft from w/c > BSC in bathroom. Min A needed slightly for incline of bathroom threshold however pt actively clearing R foot all by herself. Doffed pants with mod A for time, then pt was continent of void and BM.  Threaded brief, then pt was able to manage posterior peri-care and pants management with CGA. Ambulatory transfer at previous assist, 12 ft to w/c.  Transported dependently in w/c <> gym. Completed mod A sit > stand and min A stand pivot transfer with RW to nustep. Mod A needed for managing BLE onto foot pedals. Completed 10 mins of nustep with all 4 extremities on level 1 for AROM only, with pt tolerating well. Mod A sit > stand and stand pivot to w/c.  Back  in room, therapist assisted pt to EOB per nursing request for suture removal, with total A doffing of pants/TEDS, and max A sit > supine bed mobility. Per pt request, gait belt was applied to RLE for independence with leg mobility when in bed. Pt remained upright, with bed alarm on and all needs in reach at end of session.   Therapy Documentation Precautions:  Precautions Precautions: Fall, Other (comment) Precaution Comments: monitor SpO2 to maintain >92% (up to 2L of O2 via nasal cannula as needed), hx of seizure disorder, no R LE ROM restrictions Restrictions Weight Bearing Restrictions: Yes RLE Weight Bearing: Weight bearing as tolerated    Therapy/Group: Individual Therapy  Blase Mess, MS, OTR/L  09/17/2022, 3:46 PM

## 2022-09-17 NOTE — Patient Care Conference (Signed)
Inpatient RehabilitationTeam Conference and Plan of Care Update Date: 09/17/2022   Time: 11:47 AM   Patient Name: [name]      Medical Record Number: [id number]  Date of Birth: [birth year] Sex: Female         Room/Bed: 4W09C/4W09C-01 Payor Info: Payor: [employer] MEDICARE / Plan: [place] HMO / Product Type: *No Product type* /    Admit Date/Time:  09/05/2022  3:33 PM  Primary Diagnosis:  Intertrochanteric fracture of right hip [facility])  Hospital Problems: Principal Problem:   Intertrochanteric fracture of right hip [facility])    Expected Discharge Date: Expected Discharge Date: 10/01/22  Team Members Present: Physician leading conference: Dr. Leeroy Cha Social Worker Present: Erlene Quan, BSW Nurse Present: Dorien Chihuahua, RN PT Present: Becky Sax, PT OT Present: Jennefer Bravo, OT SLP Present: Helaine Chess, SLP PPS Coordinator present : Gunnar Fusi, SLP     Current Status/Progress Goal Weekly Team Focus  Bowel/Bladder   Continent  Maintain continency  Assess qshift and prn   Swallow/Nutrition/ Hydration             ADL's   Supervision UB bathing/dressing, mod-max LB bathing/dressing, mod A toileting  Supervision/CGA, may need to downgrade or look at extending due to slow progression  general strengthening, RLE foot clearance for functional gait, AE education, activity tolerance   Mobility   bed mobility mod A, sit<>stands and stand<>pivot transfers with R PFRW and CGA/min A, gait 21f with R PFRW and CGA/min A, WC mobility [id number]  CGA  functional mobility/transfers, generalized strengthening and endurance, dynamic standing balance/coordination, pain management, gait training, and D/C planning   Communication             Safety/Cognition/ Behavioral Observations            Pain   Pt is requiring prn pain medication for right hip fx  Patient goal is anthing less than three  Assess qshift and prn   Skin   rt upper hipsurgical wound with  sutures  Surigcal wound will be free from infection  Assess qshift and prn     Discharge Planning:  Worker's comp patient. Discharging home with MOD I/Supervision goals. Spouse unable to provide assistance.   Team Discussion: Patient urinary retention; on flomax. MD renewed home meds. Pain limiting patient; MD added muscle relaxer/ice therapy. Constipation addressed. Slow steady progress noted.  Patient on target to meet rehab goals: yes, currently needs min assist for bed mobility, CGA for transfers and car transfers and can ambulate up to 15' with a RW. Needs supervision for upper body bathing and dressing, min assist for lower body for bathing and dressing with min assist for toileting. Goals for discharge set for supervision.  *See Care Plan and progress notes for long and short-term goals.   Revisions to Treatment Plan:  Extended stay due to progress   Teaching Needs: Safety, medications, secondary risk management, skin care, transfers, toileting, etc.  Current Barriers to Discharge: Decreased caregiver support and Home enviroment access/layout  Possible Resolutions to Barriers: Family education DME: BA-BSC, TTB, w/c, RW HH follow up services     Medical Summary Current Status: bilateral lower extremity edema, hip pain, hip sutures, urinary retension, CKD, constipation  Barriers to Discharge: Medical stability  Barriers to Discharge Comments: bilateral lower extremity edema, hip pain, hip sutures, urinary retension, CKD, constipation Possible Resolutions to [employer]: increase torsemide to '40mg'$  daily, continue tylenol/prn robaxin, remove hip sutures today, monitor creatinine weekly, continue bowel program  Continued Need for Acute Rehabilitation Level of Care: The patient requires daily medical management by a physician with specialized training in physical medicine and rehabilitation for the following reasons: Direction of a multidisciplinary physical  rehabilitation program to maximize functional independence : Yes Medical management of patient stability for increased activity during participation in an intensive rehabilitation regime.: Yes Analysis of laboratory values and/or radiology reports with any subsequent need for medication adjustment and/or medical intervention. : Yes   I attest that I was present, lead the team conference, and concur with the assessment and plan of the team.   Dorien Chihuahua B 09/17/2022, 1:36 PM

## 2022-09-17 NOTE — Progress Notes (Addendum)
PROGRESS NOTE   Subjective/Complaints: Constipation improved Pain improved Swelling and breathing have improved Discussed team conference today  ROS:   Pt denies SOB, abd pain, CP, N/V/C/D, and vision changes, +hip pain- improved, +bilateral lower extremity swelling, constipated improved  Objective:   No results found. Recent Labs    09/15/22 0414  WBC 5.4  HGB 8.2*  HCT 26.3*  PLT 180   Recent Labs    09/15/22 0414  NA 139  K 4.3  CL 105  CO2 28  GLUCOSE 107*  BUN 36*  CREATININE 1.81*  CALCIUM 7.5*    Intake/Output Summary (Last 24 hours) at 09/17/2022 1046 Last data filed at 09/17/2022 0804 Gross per 24 hour  Intake 350 ml  Output --  Net 350 ml        Physical Exam: Vital Signs Blood pressure (!) 134/54, pulse 60, temperature 98 F (36.7 C), resp. rate 16, weight 77.4 kg, SpO2 96 %.    General: awake, alert, appropriate, BMI 31.2; lying in bed watching TV; NAD HENT: [name], AT , conjugate gaze; oropharynx moist CV: IIR, HR 68, no JVD Pulmonary: CTA B/L; no W/R/R- good air movement GI: soft, NT, ND, (+)BS Psychiatric: appropriate- interactive Neurological: Ox3  No clubbing, cyanosis. 2+ b/l LE edema. Pulses are 2+ Psych: Pt's affect is a little flat. Pt is cooperative Neuro:  Alert and oriented x4,follows commands, CN 2-12 intact  PE from prior encounters: Skin: Clean and intact without signs of breakdown R hip with 2 border foam dressings- CDI Sensation decreased to LT in b/l LE in stocking glove distribution, normal insight and judgement Strength 5/5 in b/l UE Strength 5/5 in LLE Strength 1-2/5 hip flexion and knee extension, 4/5 ankle PF and DF  FTN intact b/l Musculoskeletal:  Normal bulk and tone.  R hip appropriately tender, ecchymosis along R thigh Ambulating 3 feet minA Bilateral lower extremity 1+ edema GU: total A for pericare, continent  Assessment/Plan: 1. Functional  deficits which require 3+ hours per day of interdisciplinary therapy in a comprehensive inpatient rehab setting. Physiatrist is providing close team supervision and 24 hour management of active medical problems listed below. Physiatrist and rehab team continue to assess barriers to discharge/monitor patient progress toward functional and medical goals  Care Tool:  Bathing    Body parts bathed by patient: Right arm, Left arm, Chest, Abdomen, Face, Front perineal area, Buttocks, Right upper leg, Left upper leg, Right lower leg, Left lower leg   Body parts bathed by helper: Front perineal area, Buttocks, Right upper leg, Left upper leg, Right lower leg, Left lower leg     Bathing assist Assist Level: Contact Guard/Touching assist     Upper Body Dressing/Undressing Upper body dressing   What is the patient wearing?: Pull over shirt    Upper body assist Assist Level: Set up assist    Lower Body Dressing/Undressing Lower body dressing      What is the patient wearing?: Pants     Lower body assist Assist for lower body dressing: Supervision/Verbal cueing     Toileting Toileting    Toileting assist Assist for toileting: Minimal Assistance - Patient > 75%  Transfers Chair/bed transfer  Transfers assist     Chair/bed transfer assist level: Contact Guard/Touching assist Chair/bed transfer assistive device: Programmer, multimedia   Ambulation assist      Assist level: Contact Guard/Touching assist Assistive device: Walker-rolling Max distance: 88f   Walk 10 feet activity   Assist  Walk 10 feet activity did not occur: Safety/medical concerns        Walk 50 feet activity   Assist Walk 50 feet with 2 turns activity did not occur: Safety/medical concerns         Walk 150 feet activity   Assist Walk 150 feet activity did not occur: Safety/medical concerns         Walk 10 feet on uneven surface  activity   Assist Walk 10 feet on  uneven surfaces activity did not occur: Safety/medical concerns         Wheelchair     Assist Is the patient using a wheelchair?: Yes Type of Wheelchair: Manual    Wheelchair assist level: Dependent - Patient 0% Max wheelchair distance: >1548f   Wheelchair 50 feet with 2 turns activity    Assist        Assist Level: Dependent - Patient 0%   Wheelchair 150 feet activity     Assist      Assist Level: Dependent - Patient 0%   Blood pressure (!) 134/54, pulse 60, temperature 98 F (36.7 C), resp. rate 16, weight 77.4 kg, SpO2 96 %.  Medical Problem List and Plan: 1. Functional deficits secondary to right intertrochanteric hip fracture after mechanical fall.  Status post intramedullary nailing 08/30/2022.  Weightbearing as tolerated, no ROM restrictions              -patient may shower, soap and water only             -ELOS/Goals: 14 days             -Sutures to be removed around 09/12/22            Continue CIR- PT and OT  Therapy notes reviewed  Team conference today 2.  Atrial fibrillation: continue Eliquis, toprol XL restarted, will increase to '25mg'$  HS             -antiplatelet therapy: Plavix 75 mg daily             - RLE duplex ordered 9/30 for pain, edema post-op - negative 3. Pain Management: Robaxin and oxycodone as needed. Increase Robaxin frequency to q4H prn. Ordered ice to hip three times per day for 15 minutes each. Increase scheduled tylenol to 1,'0000mg'$  TID. Schedule oxycodone '10mg'$  daily at 7:30am prior to therapy. Start vitamin C supplement to help with pain sensitivity.  4. Anxiety: continue Ativan 0.5 mg every 6 hours as needed anxiety.  Provide emotional support             -antipsychotic agents: N/A 5. Neuropsych/cognition: This patient is capable of making decisions on her own behalf. 6. Skin/Wound Care: Routine skin checks 7. Fluids/Electrolytes/Nutrition: Routine in and outs with follow-up chemistries 8.  Acute blood loss anemia. Hgb down  to 7.6, repeat tomorrow 9.  AKI on CKD stage III.  Baseline creatinine 1.5-1.9.  Latest follow-up creatinine 1.80.  Follow-up renal services as needed.  Renal ultrasound negative. Currently at baseline, weight stable, will decrease torsemide back to '20mg'$  daily  -Recheck labs tomorrow 10.  Diabetes mellitus. Decrease semglee to 3 units nightly.  Sliding scale insulin. Check  blood sugars before meals and at bedtime. Monitor closely as PO intake improves. Recommended avoiding added sugars and minimizing bread, pasta, and rice in her diet. Decrease CBG checks to AC and d/c HS sliding scale. Restart semglee 3U daily.  Recent Labs    09/16/22 1707 09/16/22 2046 09/17/22 0603  GLUCAP 163* 196* 163*     11.  History of seizure disorder.  continue Keppra 500 mg twice daily 12.Myelogenous leukemia.  In remission.  Follow-up outpatient with Dr. [name] 13.  Persistent atrial fibrillation. Discussed with cardiology holding low dose eliquis until XR to assess for hematoma.  Cardiac rate controlled 14.  CAD with stenting.  continue Plavix.  No chest pain or shortness of breath 15.  Chronic diastolic congestive heart failure.  Echocardiogram 04/17/2022 ejection fraction 60 to 65%.  LOSARTAN and JARDIANCE currently on hold.   Resume as needed. Increase torsemide back to home dose of '40mg'$  daily. Discussed with patient. Weights improved, swelling has improved, discussed with patient Filed Weights   09/15/22 0500 09/16/22 0500 09/17/22 0500  Weight: 79.9 kg 77.1 kg 77.4 kg     16.  History of uterine breast cancer.  Follow-up outpatient. 17.  Hyperlipidemia.  Lipitor '40mg'$  daily 18.  Constipation.  Senokot twice daily, miralax daily. Molasses enema on 10/9 ordered.  19.  COPD with remote history of tobacco use.  Duoneb PRN. Check oxygen saturations every shift. Goal sat is 88-92%, adjusted order 20.  Acute urinary retention.  continue Flomax.   21. Indigestion             -Maalox prn 22. Low Vitamin D              -continue Ergocalciferol 50,000 units every 7 days for 7 doses. 23. Pancreas head mass. follows with Dr. Ardis Hughs 24. Obesity BMI 31.73-->32.09: provided dietary eduction. Increase torsemide given increase in weight.  25. 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  26. HTN: increase toprol XL to '25mg'$  HS, improved control    LOS: 12 days A FACE TO FACE EVALUATION WAS PERFORMED  Martha Clan P Tamaiya Bump 09/17/2022, 10:46 AM

## 2022-09-17 NOTE — Progress Notes (Signed)
Physical Therapy Session Note  Patient Details  Name: [name] MRN: [id number] Date of Birth: 1941-08-16  Today's Date: 09/17/2022 PT Individual Time: 5035-4656 PT Individual Time Calculation (min): 70 min   Short Term Goals: Week 1:  PT Short Term Goal 1 (Week 1): Pt will perform supine<>sit with min assist consistently PT Short Term Goal 1 - Progress (Week 1): Met PT Short Term Goal 2 (Week 1): Pt will perform sit<>stands using LRAD with min assist PT Short Term Goal 2 - Progress (Week 1): Progressing toward goal PT Short Term Goal 3 (Week 1): Pt will perform bed<>chair transfers using LRAD with min assist PT Short Term Goal 3 - Progress (Week 1): Met PT Short Term Goal 4 (Week 1): Pt will ambulate at least 12f using LRAD with min assist (+2 for safety as needed) PT Short Term Goal 4 - Progress (Week 1): Progressing toward goal Week 2:  PT Short Term Goal 1 (Week 2): pt will transfer sit<>supine with min A consistantly PT Short Term Goal 2 (Week 2): Pt will perform sit<>stands using LRAD with min assist PT Short Term Goal 3 (Week 2): Pt will ambulate at least 266fusing LRAD with min assist  Skilled Therapeutic Interventions/Progress Updates:   Received pt sitting in WC, pt agreeable to PT treatment, and reported pain and swelling was much better today and did not rate pain. Session with emphasis on functional mobility/transfers, toileting, generalized strengthening and endurance, dynamic standing balance, simulated car transfers, and gait training. Pt reported urge to void and transferred WC<>toilet with bedside commode over top with RW and min A. Pt required mod A for clothing management and able to void and perform peri-care with supervision. Pt sat in [facility] [facility] [place] and washed hands with set up assist. [facility] purposes.   Pt performed simulated car transfer with RW and max A. Pt required min A to [facility]  seat, CGA to transfer to car, and max A for BLE management in/out of car using gait belt as leg lifter. Pt also required cues to scoot hips back as far as possible to allow room for legs. Pt reported old 20x16 was more comfortable - therefore switched out 18x16 for 20x16 manual WC and pt appreciative. Pt then ambulated 153fith RW and CGA - pt ambulates with decreased cadence and slightly antalgic gait pattern, but was able to clear RLE from floor. Pt then performed seated BUE strengthening on UBE at level 2 for 2 minutes forward and 2 minutes backwards with 1 rest break due to SOB. Pt politely declined any further ambulation due to fatigue and performed the following seated exercises with emphasis on LE strength/ROM/edema management: -LAQ 2x10 bilaterally with 1lb ankle weight on LLE -hip flexion 2x10 bilaterally with 1lb ankle weight on LLE - AAROM using TB on RLE -hip abduction with yellow TB 2x12 Returned to room and concluded session with pt sitting in WC, needs within reach, and seatbelt alarm on. RN present at bedside attending to care.   SPO2 At rest on RA: 87% increasing to 92% with cues for pursed lip breathing After car transfer: 86% increasing to 93% After ambulating: 92%  Therapy Documentation Precautions:  Precautions Precautions: Fall, Other (comment) Precaution Comments: monitor SpO2 to maintain >92% (up to 2L of O2 via nasal cannula as needed), hx of seizure disorder, no R LE ROM restrictions Restrictions Weight Bearing Restrictions: Yes RLE Weight Bearing: Weight  bearing as tolerated  Therapy/Group: Individual Therapy  M    PT, DPT  09/17/2022, 6:58 AM  

## 2022-09-17 NOTE — Progress Notes (Signed)
Patient ID: [name], female   DOB: 12/20/1940, 81 y.o.   MRN: [id number]  Team Conference Report to Patient/Family  Team Conference discussion was reviewed with the patient and caregiver, including goals, any changes in plan of care and target discharge date.  Patient and caregiver express understanding and are in agreement.  The patient has a target discharge date of 10/01/22.  Sw met with patient and provided updates. Patient pleased about extension feeling that she needs more time. No additional questions or concerns Dyanne Iha 09/17/2022, 1:07 PM

## 2022-09-17 NOTE — Plan of Care (Signed)
  Problem: RH Balance Goal: LTG Patient will maintain dynamic standing balance (PT) Description: LTG:  Patient will maintain dynamic standing balance with assistance during mobility activities (PT) Flowsheets (Taken 09/17/2022 1208) LTG: Pt will maintain dynamic standing balance during mobility activities with:: (upgraded due to improved balance/strength) Supervision/Verbal cueing Note: upgraded due to improved balance/strength   Problem: Sit to Stand Goal: LTG:  Patient will perform sit to stand with assistance level (PT) Description: LTG:  Patient will perform sit to stand with assistance level (PT) Flowsheets (Taken 09/17/2022 1208) LTG: PT will perform sit to stand in preparation for functional mobility with assistance level: (upgraded due to improved balance/strength) Supervision/Verbal cueing Note: upgraded due to improved balance/strength   Problem: RH Bed to Chair Transfers Goal: LTG Patient will perform bed/chair transfers w/assist (PT) Description: LTG: Patient will perform bed to chair transfers with assistance (PT). Flowsheets (Taken 09/17/2022 1208) LTG: Pt will perform Bed to Chair Transfers with assistance level: (upgraded due to improved balance/strength) Supervision/Verbal cueing Note: upgraded due to improved balance/strength   Problem: RH Ambulation Goal: LTG Patient will ambulate in controlled environment (PT) Description: LTG: Patient will ambulate in a controlled environment, # of feet with assistance (PT). Flowsheets (Taken 09/17/2022 1208) LTG: Pt will ambulate in controlled environ  assist needed:: (upgraded due to improved balance/strength) Supervision/Verbal cueing LTG: Ambulation distance in controlled environment: 86f with LRAD Goal: LTG Patient will ambulate in home environment (PT) Description: LTG: Patient will ambulate in home environment, # of feet with assistance (PT). Flowsheets (Taken 09/17/2022 1208) LTG: Pt will ambulate in home environ  assist  needed:: (upgraded due to improved balance/strength) Supervision/Verbal cueing LTG: Ambulation distance in home environment: 255fwith LRAD Note: upgraded due to improved balance/strength

## 2022-09-18 ENCOUNTER — Encounter: Payer: Self-pay | Admitting: Oncology

## 2022-09-18 DIAGNOSIS — S72144A Nondisplaced intertrochanteric fracture of right femur, initial encounter for closed fracture: Secondary | ICD-10-CM

## 2022-09-18 LAB — URINALYSIS, ROUTINE W REFLEX MICROSCOPIC
Bilirubin Urine: NEGATIVE
Glucose, UA: >=500 mg/dL — AB
Ketones, ur: NEGATIVE mg/dL
Nitrite: POSITIVE — AB
Protein, ur: NEGATIVE mg/dL
Specific Gravity, Urine: 1.008 (ref 1.005–1.030)
WBC, UA: >50 WBC/hpf — ABNORMAL HIGH (ref 0–5)
pH: 5 (ref 5.0–8.0)

## 2022-09-18 LAB — GLUCOSE, CAPILLARY
Glucose-Capillary: 166 mg/dL — ABNORMAL HIGH (ref 70–99)
Glucose-Capillary: 225 mg/dL — ABNORMAL HIGH (ref 70–99)
Glucose-Capillary: 245 mg/dL — ABNORMAL HIGH (ref 70–99)
Glucose-Capillary: 265 mg/dL — ABNORMAL HIGH (ref 70–99)

## 2022-09-18 MED ORDER — INSULIN GLARGINE-YFGN 100 UNIT/ML ~~LOC~~ SOLN
4.0000 [IU] | Freq: Every day | SUBCUTANEOUS | Status: DC
  Administered 2022-09-18: 4 [IU] via SUBCUTANEOUS
  Filled 2022-09-18 (×2): qty 0.04

## 2022-09-18 MED ORDER — METOPROLOL SUCCINATE ER 25 MG PO TB24
12.5000 mg | ORAL_TABLET | Freq: Every day | ORAL | Status: DC
  Administered 2022-09-18 – 2022-09-30 (×13): 12.5 mg via ORAL
  Filled 2022-09-18 (×13): qty 1

## 2022-09-18 MED ORDER — INSULIN GLARGINE-YFGN 100 UNIT/ML ~~LOC~~ SOLN
6.0000 [IU] | Freq: Every day | SUBCUTANEOUS | Status: DC
  Administered 2022-09-19 – 2022-09-21 (×3): 6 [IU] via SUBCUTANEOUS
  Filled 2022-09-18 (×3): qty 0.06

## 2022-09-18 MED ORDER — IPRATROPIUM-ALBUTEROL 0.5-2.5 (3) MG/3ML IN SOLN
3.0000 mL | Freq: Two times a day (BID) | RESPIRATORY_TRACT | Status: DC
  Administered 2022-09-19 – 2022-09-23 (×9): 3 mL via RESPIRATORY_TRACT
  Filled 2022-09-18 (×9): qty 3

## 2022-09-18 MED ORDER — LIDOCAINE 5 % EX PTCH
1.0000 | MEDICATED_PATCH | CUTANEOUS | Status: DC
  Administered 2022-09-18 – 2022-09-30 (×12): 1 via TRANSDERMAL
  Filled 2022-09-18 (×13): qty 1

## 2022-09-18 MED ORDER — ACETAMINOPHEN 500 MG PO TABS
1000.0000 mg | ORAL_TABLET | Freq: Three times a day (TID) | ORAL | Status: DC
  Administered 2022-09-18 – 2022-10-01 (×38): 1000 mg via ORAL
  Filled 2022-09-18 (×38): qty 2

## 2022-09-18 MED ORDER — CEPHALEXIN 250 MG PO CAPS
500.0000 mg | ORAL_CAPSULE | Freq: Two times a day (BID) | ORAL | Status: DC
  Administered 2022-09-18 – 2022-09-20 (×4): 500 mg via ORAL
  Filled 2022-09-18 (×4): qty 2

## 2022-09-18 MED ORDER — INSULIN GLARGINE-YFGN 100 UNIT/ML ~~LOC~~ SOLN: 4.0000 [IU] | Freq: Every day | SUBCUTANEOUS | Status: DC

## 2022-09-18 NOTE — Progress Notes (Signed)
Occupational Therapy Session Note  Patient Details  Name: DAINELLE HUN MRN: [id number] Date of Birth: 02/06/1941  Today's Date: 09/18/2022 OT Individual Time: 0830-0900 OT Individual Time Calculation (min): 30 min    Short Term Goals: Week 1:  OT Short Term Goal 1 (Week 1): Patient to perform transfer to [facility] with Mod A using LRAD OT Short Term Goal 1 - Progress (Week 1): Met OT Short Term Goal 2 (Week 1): Patient to perform LE dressing with AE as needed OT Short Term Goal 2 - Progress (Week 1): Met OT Short Term Goal 3 (Week 1): Patient to perform bathing with Mod A and AE as needed OT Short Term Goal 3 - Progress (Week 1): Not met OT Short Term Goal 4 (Week 1): Patient to perform shower transfer with Mod A and LRAD OT Short Term Goal 4 - Progress (Week 1): Not met Week 2:  OT Short Term Goal 1 (Week 2): Patient to perform bathing with Mod A and AE as needed OT Short Term Goal 2 (Week 2): Patient to perform bathing with Mod A and AE as needed OT Short Term Goal 3 (Week 2): Pt will demonstrate increased AROM of RLE /R foot clearance during toilet transfers with no physical assist needed for RLE management Week 3:     Skilled Therapeutic Interventions/Progress Updates:    1:1 Pt received in the bed on the bed pan. Self care retraining: Discussed with pt and encouraged her to ask to use the [facility] for more opportunities to get up and transfer- even if she wants to get back in bed. Pt able to roll to her left with supervision with bed rail. Contact guard to get to EOB sitting position with extra time. Assisted with washing pt's LEs and donning thigh high TEDS. Pt able to thread brief and thread pants with reacher. Assisted with pulling up brief and fastening but pt able to perform pants clothing mangement. Pt able to ambulate with RW with min A over to the w/c. Left pt at the sink to finish washing her face and brushing teeth. NT will help with Assisted her back to bedside.  Therapy  Documentation Precautions:  Precautions Precautions: Fall, Other (comment) Precaution Comments: monitor SpO2 to maintain >92% (up to 2L of O2 via nasal cannula as needed), hx of seizure disorder, no R LE ROM restrictions Restrictions Weight Bearing Restrictions: Yes RLE Weight Bearing: Weight bearing as tolerated  Pain:  No c/o pain in session    Therapy/Group: Individual Therapy  Willeen Cass [facility] 09/18/2022, 7:44 PM

## 2022-09-18 NOTE — Progress Notes (Signed)
Physical Therapy Session Note  Patient Details  Name: [name] MRN: [id number] Date of Birth: [birth year]  Today's Date: 09/18/2022 PT Individual Time: 4174-0814 PT Individual Time Calculation (min): 68 min   Short Term Goals: Week 1:  PT Short Term Goal 1 (Week 1): Pt will perform supine<>sit with min assist consistently PT Short Term Goal 1 - Progress (Week 1): Met PT Short Term Goal 2 (Week 1): Pt will perform sit<>stands using LRAD with min assist PT Short Term Goal 2 - Progress (Week 1): Progressing toward goal PT Short Term Goal 3 (Week 1): Pt will perform bed<>chair transfers using LRAD with min assist PT Short Term Goal 3 - Progress (Week 1): Met PT Short Term Goal 4 (Week 1): Pt will ambulate at least 81f using LRAD with min assist (+2 for safety as needed) PT Short Term Goal 4 - Progress (Week 1): Progressing toward goal Week 2:  PT Short Term Goal 1 (Week 2): pt will transfer sit<>supine with min A consistantly PT Short Term Goal 2 (Week 2): Pt will perform sit<>stands using LRAD with min assist PT Short Term Goal 3 (Week 2): Pt will ambulate at least 258fusing LRAD with min assist  Skilled Therapeutic Interventions/Progress Updates:   Received pt sitting in WC, pt agreeable to PT treatment, and denied any pain at rest but reported being sore from previous PT session (premedicated). However, by end of session pt reporting noticeable increase in pain. Pt pleased that she was able to progress gait distance but requested not to "overdo" it today with 3 PT sessions. Session with emphasis on functional mobility/transfers, generalized strengthening and endurance, and toileting. Pt transported outside to [facility] and performed WC mobility 6520fsing BUE and supervision/light min A up/down mild slopes/hills with emphasis on UE strength. Pt then performed the following exercises sitting in sunshine with emphasis on UE strength: -horizontal ER with yellow TB  2x15 -rows with yellow TB 2x15 -tricep extensions with yellow TB 3x10 Pt required multiple, extended rest breaks throughout session due to fatigue. Pt transported back to 4W dayroom in WC [place] and performed seated BLE strengthening on Kinetron at 20 cm/sec for 1 minute x 3 trials with emphasis on glute/quad strength and ROM with BUE support. Pt required increased time and encouragement as pt whining in discomfort from soreness in R hip. Pt reported urge to void and transported back to room. Pt transferred to/from toilet with bedside commode over top with RW and CGA (min A to stand [facility] stiffness in R hip). Pt required mod A for clothing management but able to void and perform hygiene management without assist. Pt sat in WC at sink and washed hands with set up assist. Concluded session with pt sitting in WC, needs within reach, and seatbelt alarm on. RN present at bedside to administer medications.  SPO2 At rest on RA: 90% After working on Kinetron: 88% increasing to 94%  Therapy Documentation Precautions:  Precautions Precautions: Fall, Other (comment) Precaution Comments: monitor SpO2 to maintain >92% (up to 2L of O2 via nasal cannula as needed), hx of seizure disorder, no R LE ROM restrictions Restrictions Weight Bearing Restrictions: Yes RLE Weight Bearing: Weight bearing as tolerated  Therapy/Group: Individual Therapy AnnAlfonse Alpers, DPT  09/18/2022, 7:08 AM

## 2022-09-18 NOTE — Progress Notes (Signed)
PA aware of UA Results.   Yehuda Mao, LPN

## 2022-09-18 NOTE — Progress Notes (Signed)
PROGRESS NOTE   Subjective/Complaints: In pain during therapy this morning. She asks whether tylenol can be scheduled at 5 to get ahead of her pain OT notes cloudy urine. She does urinate every hour. Denies dysuria  ROS:   Pt denies SOB, abd pain, CP, N/V/C/D, and vision changes, +hip pain- improved, +bilateral lower extremity swelling, constipated improved, +urinary frequency  Objective:   No results found. No results for input(s): "WBC", "HGB", "HCT", "PLT" in the last 72 hours.  No results for input(s): "NA", "K", "CL", "CO2", "GLUCOSE", "BUN", "CREATININE", "CALCIUM" in the last 72 hours.   Intake/Output Summary (Last 24 hours) at 09/18/2022 1105 Last data filed at 09/18/2022 0741 Gross per 24 hour  Intake 834 ml  Output 250 ml  Net 584 ml        Physical Exam: Vital Signs Blood pressure (!) 128/58, pulse 70, temperature 98.4 F (36.9 C), resp. rate 15, weight 79.1 kg, SpO2 98 %.    General: awake, alert, appropriate, BMI 31.89; lying in bed watching TV; NAD HENT: [place], AT , conjugate gaze; oropharynx moist CV: IIR, HR 68, no JVD Pulmonary: CTA B/L; no W/R/R- good air movement GI: soft, NT, ND, (+)BS Psychiatric: appropriate- interactive Neurological: Ox3  No clubbing, cyanosis. 2+ b/l LE edema. Pulses are 2+ Psych: Pt's affect is a little flat. Pt is cooperative Neuro:  Alert and oriented x4,follows commands, CN 2-12 intact  PE from prior encounters: Skin: Clean and intact without signs of breakdown R hip with 2 border foam dressings- CDI Sensation decreased to LT in b/l LE in stocking glove distribution, normal insight and judgement Strength 5/5 in b/l UE Strength 5/5 in LLE Strength 1-2/5 hip flexion and knee extension, 4/5 ankle PF and DF  FTN intact b/l Musculoskeletal:  Normal bulk and tone.  R hip appropriately tender, ecchymosis along R thigh Ambulating 3 feet minA Bilateral lower extremity 1+  edema TTP low back GU: total A for pericare, continent  Assessment/Plan: 1. Functional deficits which require 3+ hours per day of interdisciplinary therapy in a comprehensive inpatient rehab setting. Physiatrist is providing close team supervision and 24 hour management of active medical problems listed below. Physiatrist and rehab team continue to assess barriers to discharge/monitor patient progress toward functional and medical goals  Care Tool:  Bathing    Body parts bathed by patient: Right arm, Left arm, Chest, Abdomen, Face, Front perineal area, Buttocks, Right upper leg, Left upper leg, Right lower leg, Left lower leg   Body parts bathed by helper: Front perineal area, Buttocks, Right upper leg, Left upper leg, Right lower leg, Left lower leg     Bathing assist Assist Level: Contact Guard/Touching assist     Upper Body Dressing/Undressing Upper body dressing   What is the patient wearing?: Pull over shirt    Upper body assist Assist Level: Set up assist    Lower Body Dressing/Undressing Lower body dressing      What is the patient wearing?: Pants     Lower body assist Assist for lower body dressing: Supervision/Verbal cueing     Toileting Toileting    Toileting assist Assist for toileting: Contact Guard/Touching assist  Transfers Chair/bed transfer  Transfers assist     Chair/bed transfer assist level: Contact Guard/Touching assist Chair/bed transfer assistive device: Programmer, multimedia   Ambulation assist      Assist level: Contact Guard/Touching assist Assistive device: Walker-rolling Max distance: 106f   Walk 10 feet activity   Assist  Walk 10 feet activity did not occur: Safety/medical concerns  Assist level: Contact Guard/Touching assist Assistive device: Walker-rolling   Walk 50 feet activity   Assist Walk 50 feet with 2 turns activity did not occur: Safety/medical concerns         Walk 150 feet  activity   Assist Walk 150 feet activity did not occur: Safety/medical concerns         Walk 10 feet on uneven surface  activity   Assist Walk 10 feet on uneven surfaces activity did not occur: Safety/medical concerns         Wheelchair     Assist Is the patient using a wheelchair?: Yes Type of Wheelchair: Manual    Wheelchair assist level: Dependent - Patient 0% Max wheelchair distance: >1510f   Wheelchair 50 feet with 2 turns activity    Assist        Assist Level: Dependent - Patient 0%   Wheelchair 150 feet activity     Assist      Assist Level: Dependent - Patient 0%   Blood pressure (!) 128/58, pulse 70, temperature 98.4 F (36.9 C), resp. rate 15, weight 79.1 kg, SpO2 98 %.  Medical Problem List and Plan: 1. Functional deficits secondary to right intertrochanteric hip fracture after mechanical fall.  Status post intramedullary nailing 08/30/2022.  Weightbearing as tolerated, no ROM restrictions              -patient may shower, soap and water only             -ELOS/Goals: 14 days             -Sutures to be removed around 09/12/22            Continue CIR- PT and OT  Therapy notes reviewed 2.  Atrial fibrillation: continue Eliquis, toprol XL restarted, will decrease to 12.'5mg'$  HS             -antiplatelet therapy: Plavix 75 mg daily             - RLE duplex ordered 9/30 for pain, edema post-op - negative 3. Pain Management: Robaxin and oxycodone as needed. Increase Robaxin frequency to q4H prn. Ordered ice to hip three times per day for 15 minutes each. Increase scheduled tylenol to 1,'0000mg'$  TID- changed start time to 5am. Schedule oxycodone '10mg'$  daily at 7:30am prior to therapy. Start vitamin C supplement to help with pain sensitivity.  4. Anxiety: continue Ativan 0.5 mg every 6 hours as needed anxiety.  Provide emotional support             -antipsychotic agents: N/A 5. Neuropsych/cognition: This patient is capable of making decisions on her  own behalf. 6. Skin/Wound Care: Routine skin checks 7. Fluids/Electrolytes/Nutrition: Routine in and outs with follow-up chemistries 8.  Acute blood loss anemia. Hgb down to 7.6, repeat tomorrow 9.  AKI on CKD stage III.  Baseline creatinine 1.5-1.9.  Latest follow-up creatinine 1.80.  Follow-up renal services as needed.  Renal ultrasound negative. Currently at baseline, weight stable, will decrease torsemide back to '20mg'$  daily. Monitor labs weekly. 10.  Diabetes mellitus. Decrease semglee to 3 units nightly.  Sliding scale insulin. Check blood sugars before meals and at bedtime. Monitor closely as PO intake improves. Recommended avoiding added sugars and minimizing bread, pasta, and rice in her diet. Decrease CBG checks to AC and d/c HS sliding scale. Increase Semglee to 4U Recent Labs    09/17/22 1633 09/17/22 2044 09/18/22 0604  GLUCAP 222* 198* 166*     11.  History of seizure disorder.  continue Keppra 500 mg twice daily 12.Myelogenous leukemia.  In remission.  Follow-up outpatient with Dr. [name] 13.  Persistent atrial fibrillation. Discussed with cardiology holding low dose eliquis until XR to assess for hematoma.  Cardiac rate controlled 14.  CAD with stenting.  continue Plavix.  No chest pain or shortness of breath 15.  Chronic diastolic congestive heart failure.  Echocardiogram 04/17/2022 ejection fraction 60 to 65%.  LOSARTAN and JARDIANCE currently on hold.   Resume as needed. Increase torsemide back to home dose of '40mg'$  daily. Discussed with patient. Weights improved, swelling has improved, discussed with patient Filed Weights   09/16/22 0500 09/17/22 0500 09/18/22 0500  Weight: 77.1 kg 77.4 kg 79.1 kg     16.  History of uterine breast cancer.  Follow-up outpatient. 17.  Hyperlipidemia.  Lipitor '40mg'$  daily 18.  Constipation.  Senokot twice daily, miralax daily. Molasses enema on 10/9 ordered.  19.  COPD with remote history of tobacco use.  Duoneb PRN. Check oxygen  saturations every shift. Goal sat is 88-92%, adjusted order 20.  Acute urinary retention.  continue Flomax.   21. Indigestion             -Maalox prn 22. Low Vitamin D             -continue Ergocalciferol 50,000 units every 7 days for 7 doses. 23. Pancreas head mass. follows with Dr. Ardis Hughs 24. Obesity BMI 31.73-->32.09: provided dietary eduction. Increase torsemide given increase in weight.  25. 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  26. HTN: increase toprol XL to '25mg'$  HS, improved control    LOS: 13 days A FACE TO FACE EVALUATION WAS PERFORMED  Georgene Kopper P Arloa Prak 09/18/2022, 11:05 AM

## 2022-09-18 NOTE — Progress Notes (Signed)
Physical Therapy Session Note  Patient Details  Name: [name] MRN: [id number] Date of Birth: 03/12/1941  Today's Date: 09/18/2022 PT Individual Time: 0915-1000 PT Individual Time Calculation (min): 45 min   Short Term Goals: Week 1:  PT Short Term Goal 1 (Week 1): Pt will perform supine<>sit with min assist consistently PT Short Term Goal 1 - Progress (Week 1): Met PT Short Term Goal 2 (Week 1): Pt will perform sit<>stands using LRAD with min assist PT Short Term Goal 2 - Progress (Week 1): Progressing toward goal PT Short Term Goal 3 (Week 1): Pt will perform bed<>chair transfers using LRAD with min assist PT Short Term Goal 3 - Progress (Week 1): Met PT Short Term Goal 4 (Week 1): Pt will ambulate at least 60f using LRAD with min assist (+2 for safety as needed) PT Short Term Goal 4 - Progress (Week 1): Progressing toward goal Week 2:  PT Short Term Goal 1 (Week 2): pt will transfer sit<>supine with min A consistantly PT Short Term Goal 2 (Week 2): Pt will perform sit<>stands using LRAD with min assist PT Short Term Goal 3 (Week 2): Pt will ambulate at least 274fusing LRAD with min assist  Skilled Therapeutic Interventions/Progress Updates:  Patient greeted sitting upright in wheelchair in room and agreeable to PT treatment session. Patient on 1L oxygen via [place] in the room, however removed during treatment session- Patient's sats >92% with exertion and increased to 95% at rest. Patient reporting 6/10 in R hip, however premedicated prior to start of session.   Patient performed various sit/stands with RW and CGA- VC for scooting forward and increased anterior weight shift prior to standing with good improvements and independence noted.   Patient gait trained 2 x 3329with RW and CGA/SBA for safety- Patient demonstrated a step-to gait pattern with decreased stance time on R LE secondary to pain. VC for increased R foot clearance as patient tends to drag that foot, especially as she  fatigued. Shoe-cover donned to R foot during second half of second gait trial in order to improve overall independence and fluidity of gait and increased endurance/activity tolerance instead of ending gait trial secondary to inability to advance R LE.   Patient required an extended seated rest break after each gait trial secondary to impaired endurance/activity tolerance. VC for pursed lip breathing with good improvements in breath rate noted.   Seated therex performed to increased B LE strength and ROM for improved functional mobility and independence- R LAQ with 1.5#, 3 x 10 with 3 second hold  L LAQ with 3#, 3 x 10 with 3 second hold   Patient returned to her room via wheelchair for time management- Once in her room, patient requesting to use the restroom. Patient wheeled into the bathroom and performed sit/stand with RW and MinA for initiating standing secondary to fatigue. Patient then performed stand pivot transfer to raised toilet seat with CGA. Patient left sitting on commode and agreeable to pulling cord when ready to stand- NT notified.    Therapy Documentation Precautions:  Precautions Precautions: Fall, Other (comment) Precaution Comments: monitor SpO2 to maintain >92% (up to 2L of O2 via nasal cannula as needed), hx of seizure disorder, no R LE ROM restrictions Restrictions Weight Bearing Restrictions: Yes RLE Weight Bearing: Weight bearing as tolerated  Therapy/Group: Individual Therapy  Azyria Osmon 09/18/2022, 7:57 AM

## 2022-09-18 NOTE — Progress Notes (Signed)
Physical Therapy Session Note  Patient Details  Name: [name] MRN: [id number] Date of Birth: Jun 21, 1941  Today's Date: 09/18/2022 PT Individual Time: 1300-1335 PT Individual Time Calculation (min): 35 min   Short Term Goals: Week 2:  PT Short Term Goal 1 (Week 2): pt will transfer sit<>supine with min A consistantly PT Short Term Goal 2 (Week 2): Pt will perform sit<>stands using LRAD with min assist PT Short Term Goal 3 (Week 2): Pt will ambulate at least 20f using LRAD with min assist  Skilled Therapeutic Interventions/Progress Updates:     Patient in w/c in the room upon PT arrival. Patient alert and agreeable to PT session. Patient reported 6-7/10 R hip pain during session, RN made aware and provided pain medications. PT provided repositioning, rest breaks, and distraction as pain interventions throughout session.   Patient reports delayed pain medication administration this morning and elevated pain levels throughout the morning and escalating pain this afternoon. Educated on pain medications available, PRN vs scheduled pain medication, and importance of maintaining pain control for improved mobility and exercise tolerance during therapies. Patient appreciative and receptive to education.   Patient performed stand pivot w/c>bed with mod A to stand and min A to pivot and sit using RW. Provided cues for scooting forward, forward weight shift, and placing R foot ahead for reduced weight bearing and hip flexion angle for pain management with mobility.  Patient performed sit to supine with mod A for B lower extremity management.   Performed diaphragmatic breathing with multimodal cues for technique for reduced autonomic nervous system stimulation and improve parasympathetic response x5 min. Used guided imagery for muscle relaxation throughout.   Educated on doffing TED hose prior to bed, patient requested to leave them on for edema control at this time. Applied heating pad to L hip to  continue muscle relaxation following breathing exercises.   Patient in bed at end of session with breaks locked, bed alarm set, 4 rails up per patient request, and all needs within reach.   Therapy Documentation Precautions:  Precautions Precautions: Fall, Other (comment) Precaution Comments: monitor SpO2 to maintain >92% (up to 2L of O2 via nasal cannula as needed), hx of seizure disorder, no R LE ROM restrictions Restrictions Weight Bearing Restrictions: Yes RLE Weight Bearing: Weight bearing as tolerated General: PT Amount of Missed Time (min): 10 Minutes PT Missed Treatment Reason: Pain    Therapy/Group: Individual Therapy  [name] L [name] PT, DPT, NCS, CBIS  09/18/2022, 4:17 PM

## 2022-09-19 ENCOUNTER — Encounter: Payer: Self-pay | Admitting: Oncology

## 2022-09-19 DIAGNOSIS — S72144S Nondisplaced intertrochanteric fracture of right femur, sequela: Secondary | ICD-10-CM

## 2022-09-19 LAB — GLUCOSE, CAPILLARY
Glucose-Capillary: 230 mg/dL — ABNORMAL HIGH (ref 70–99)
Glucose-Capillary: 238 mg/dL — ABNORMAL HIGH (ref 70–99)
Glucose-Capillary: 164 mg/dL — ABNORMAL HIGH (ref 70–99)

## 2022-09-19 MED ORDER — INSULIN ASPART 100 UNIT/ML IJ SOLN
0.0000 [IU] | Freq: Every day | INTRAMUSCULAR | Status: DC
  Administered 2022-09-20: 2 [IU] via SUBCUTANEOUS

## 2022-09-19 MED ORDER — INSULIN ASPART 100 UNIT/ML IJ SOLN
0.0000 [IU] | Freq: Three times a day (TID) | INTRAMUSCULAR | Status: DC
  Administered 2022-09-19: 3 [IU] via SUBCUTANEOUS
  Administered 2022-09-20 (×2): 1 [IU] via SUBCUTANEOUS
  Administered 2022-09-20 – 2022-09-21 (×2): 2 [IU] via SUBCUTANEOUS
  Administered 2022-09-21: 1 [IU] via SUBCUTANEOUS
  Administered 2022-09-22: 2 [IU] via SUBCUTANEOUS
  Administered 2022-09-22 (×2): 1 [IU] via SUBCUTANEOUS
  Administered 2022-09-23 – 2022-09-24 (×2): 2 [IU] via SUBCUTANEOUS

## 2022-09-19 NOTE — Progress Notes (Signed)
Occupational Therapy Session Note  Patient Details  Name: [name] MRN: [id number] Date of Birth: [birth year]  Today's Date: 09/19/2022 OT Individual Time: 0904-1000 & 1303-1400 OT Individual Time Calculation (min): 56 min & 57 min   Short Term Goals: Week 2:  OT Short Term Goal 1 (Week 2): Patient to perform bathing with Mod A and AE as needed OT Short Term Goal 2 (Week 2): Patient to perform bathing with Mod A and AE as needed OT Short Term Goal 3 (Week 2): Pt will demonstrate increased AROM of RLE /R foot clearance during toilet transfers with no physical assist needed for RLE management  Skilled Therapeutic Interventions/Progress Updates:  Session 1 Skilled OT intervention completed with focus on toileting needs, ADL endurance and retraining, functional transfers. Pt received seated in w/c, without pants, expressing frustration regarding nursing "making her" walk to the bathroom from the bed. Therapist offered encouragement for ambulating to bathroom for increased exercise and in prep for bathroom transfers/toileting needs at home. Unrated "soreness" from yesterday reported in R hip, pre-medicated. Therapist offered rest breaks, modified transfers for pain reduction. On RA, with mild SOB, however remained on RA for duration of session with SPO2 WNLs.  Respiratory services in room for breathing treatment, with therapist retrieving hygiene items for ADL session per pt request. Per RT, therapist doffed breathing treatment mask upon return to room due to treatment being finished. Transported w/c into bathroom for pain management, then min A sit > stand using grab bar then mod A stand pivot to [facility] over toilet with HHA only and no AD. Pt was continent of void and BM. CGA sit > stand with use of grab bar only, CGA for pericare with therapist only ensuring cleanliness, and min A needed for donning brief over hips. Mod A stand pivot with HHA and intermittent use of grab bars to w/c with cues for  weight shifting.  Seated at sink, pt completed oral care, and facial hygiene with set up A. UB dressing with set up A. Donned thigh high TEDS for edema with mod A, pants with use of reacher at the sit > stand level with CGA for balance only, and shoes with max A for time. Min A sit > stand using RW then 7 ft ambulatory transfer to recliner with CGA. Encouraged pt to trial recliner for the feature of elevating BLE for edema management as pt had legs down for period of time after toileting this morning and swelling was moderate in both LE. Pt remained seated on w/c cushion for pressure relief on recliner, BLE elevated, pillows placed and heating pad applied to R hip for comfort, fresh diet ginger ale per request and all needs in reach at end of session.  Session 2 Skilled OT intervention completed with focus on toileting needs/positioning and BUE strengthening. Pt received seated in w/c, attempting to wheel herself into the bathroom due to urgent need for BM. Assisted pt with dependent transport in w/c > BSC over toilet, min A sit > stand using RW and CGA stand pivot using RW to toilet. Pt was continent of BM with difficulty expressed passing the last part of the BM. Per pt request, upon inspection in stance, pt appeared to be impacted. Pt indicated that when this happens at baseline a warm water bath normally helps. Nurse notified, and in room to attempt disimpacting, with therapist assisting with min A for heavy anterior lean in stance for task positioning. Assisted with positioning on grey bed pan with warm  water, for pt to loosen stool, with success passing the hard clump. Mod A sit > stand from commode due to extended seated time, then mod A for toileting needs due to fatigue and discomfort. CGA stand pivot to w/c, then completed hang hygiene at sink with set up A.   Pt reported fatigue and 6/10 pain in R hip, nurse aware, with pt agreeable to seated exercises vs returning to bed for rest of session to  minimize pain prior to last PT session. Completed the following to promote strength needed for ADL management and functional transfers: (With yellow theraband) 10 reps Horizontal abduction Self-anchored shoulder flexion each arm Self-anchored bicep flexion each arm Shoulder external rotation Therapist anchored scapular retraction each arm  Pt remained seated in w/c with BLE elevated, prune juice provided per request and all needs in reach at end of session.    Therapy Documentation Precautions:  Precautions Precautions: Fall, Other (comment) Precaution Comments: monitor SpO2 to maintain >92% (up to 2L of O2 via nasal cannula as needed), hx of seizure disorder, no R LE ROM restrictions Restrictions Weight Bearing Restrictions: Yes RLE Weight Bearing: Weight bearing as tolerated    Therapy/Group: Individual Therapy  Blase Mess, MS, OTR/L  09/19/2022, 2:01 PM

## 2022-09-19 NOTE — Progress Notes (Signed)
PROGRESS NOTE   Subjective/Complaints: No new complaints this morning Leg swelling has increased slightly Had BM yesterday but not today yet Pain is stable  ROS:   Pt denies SOB, abd pain, CP, N/V/C/D, and vision changes, +hip pain- improved, +bilateral lower extremity swelling- overall improved, constipated improved, +urinary frequency  Objective:   No results found. No results for input(s): "WBC", "HGB", "HCT", "PLT" in the last 72 hours.  No results for input(s): "NA", "K", "CL", "CO2", "GLUCOSE", "BUN", "CREATININE", "CALCIUM" in the last 72 hours.   Intake/Output Summary (Last 24 hours) at 09/19/2022 0934 Last data filed at 09/18/2022 1859 Gross per 24 hour  Intake 360 ml  Output --  Net 360 ml        Physical Exam: Vital Signs Blood pressure (!) 138/50, pulse 64, temperature 98.4 F (36.9 C), temperature source Oral, resp. rate 20, weight 77.4 kg, SpO2 98 %.    General: awake, alert, appropriate, BMI 31.20; lying in bed watching TV; NAD HENT: [name], AT , conjugate gaze; oropharynx moist CV: IIR, HR 68, no JVD Pulmonary: CTA B/L; no W/R/R- good air movement GI: soft, NT, ND, (+)BS Psychiatric: appropriate- interactive Neurological: Ox3  No clubbing, cyanosis. 2+ b/l LE edema. Pulses are 2+ Psych: Pt's affect is a little flat. Pt is cooperative Neuro:  Alert and oriented x4,follows commands, CN 2-12 intact  PE from prior encounters: Skin: Clean and intact without signs of breakdown R hip with 2 border foam dressings- CDI Sensation decreased to LT in b/l LE in stocking glove distribution, normal insight and judgement Strength 5/5 in b/l UE Strength 5/5 in LLE Strength 1-2/5 hip flexion and knee extension, 4/5 ankle PF and DF  FTN intact b/l Musculoskeletal:  Normal bulk and tone.  R hip appropriately tender, ecchymosis along R thigh Ambulating 3 feet minA Bilateral lower extremity 1-2 edema: mildly  increased today TTP low back GU: total A for pericare, continent  Assessment/Plan: 1. Functional deficits which require 3+ hours per day of interdisciplinary therapy in a comprehensive inpatient rehab setting. Physiatrist is providing close team supervision and 24 hour management of active medical problems listed below. Physiatrist and rehab team continue to assess barriers to discharge/monitor patient progress toward functional and medical goals  Care Tool:  Bathing    Body parts bathed by patient: Right arm, Left arm, Chest, Abdomen, Face, Front perineal area, Buttocks, Right upper leg, Left upper leg, Right lower leg, Left lower leg   Body parts bathed by helper: Front perineal area, Buttocks, Right upper leg, Left upper leg, Right lower leg, Left lower leg     Bathing assist Assist Level: Contact Guard/Touching assist     Upper Body Dressing/Undressing Upper body dressing   What is the patient wearing?: Pull over shirt    Upper body assist Assist Level: Set up assist    Lower Body Dressing/Undressing Lower body dressing      What is the patient wearing?: Pants     Lower body assist Assist for lower body dressing: Supervision/Verbal cueing     Toileting Toileting    Toileting assist Assist for toileting: Contact Guard/Touching assist     Transfers Chair/bed transfer  Transfers  assist     Chair/bed transfer assist level: Contact Guard/Touching assist Chair/bed transfer assistive device: Programmer, multimedia   Ambulation assist      Assist level: Contact Guard/Touching assist Assistive device: Walker-rolling Max distance: 72f   Walk 10 feet activity   Assist  Walk 10 feet activity did not occur: Safety/medical concerns  Assist level: Contact Guard/Touching assist Assistive device: Walker-rolling   Walk 50 feet activity   Assist Walk 50 feet with 2 turns activity did not occur: Safety/medical concerns         Walk 150 feet  activity   Assist Walk 150 feet activity did not occur: Safety/medical concerns         Walk 10 feet on uneven surface  activity   Assist Walk 10 feet on uneven surfaces activity did not occur: Safety/medical concerns         Wheelchair     Assist Is the patient using a wheelchair?: Yes Type of Wheelchair: Manual    Wheelchair assist level: Dependent - Patient 0% Max wheelchair distance: >1536f   Wheelchair 50 feet with 2 turns activity    Assist        Assist Level: Dependent - Patient 0%   Wheelchair 150 feet activity     Assist      Assist Level: Dependent - Patient 0%   Blood pressure (!) 138/50, pulse 64, temperature 98.4 F (36.9 C), temperature source Oral, resp. rate 20, weight 77.4 kg, SpO2 98 %.  Medical Problem List and Plan: 1. Functional deficits secondary to right intertrochanteric hip fracture after mechanical fall.  Status post intramedullary nailing 08/30/2022.  Weightbearing as tolerated, no ROM restrictions              -patient may shower, soap and water only             -ELOS/Goals: 14 days             -Sutures removed 09/12/22             continue CIR- PT and OT  Therapy notes reviewed 2.  Atrial fibrillation: continue Eliquis, toprol XL restarted, will decrease to 12.'5mg'$  HS             -antiplatelet therapy: Plavix 75 mg daily             - RLE duplex ordered 9/30 for pain, edema post-op - negative 3. Pain Management: continue Robaxin and oxycodone as needed. Increase Robaxin frequency to q4H prn. Ordered ice to hip three times per day for 15 minutes each. Increase scheduled tylenol to 1,'0000mg'$  TID- changed start time to 5am. Schedule oxycodone '10mg'$  daily at 7:30am prior to therapy. Start vitamin C supplement to help with pain sensitivity.  4. Anxiety: continue Ativan 0.5 mg every 6 hours as needed anxiety.  Provide emotional support             -antipsychotic agents: N/A 5. Neuropsych/cognition: This patient is capable of  making decisions on her own behalf. 6. Skin/Wound Care: Routine skin checks 7. Fluids/Electrolytes/Nutrition: Routine in and outs with follow-up chemistries 8.  Acute blood loss anemia. Hgb down to 7.6, repeat tomorrow 9.  AKI on CKD stage III.  Baseline creatinine 1.5-1.9.  Latest follow-up creatinine 1.80.  Follow-up renal services as needed.  Renal ultrasound negative. Currently at baseline, weight stable, will decrease torsemide back to '20mg'$  daily. Monitor labs weekly. 10.  Diabetes mellitus. Decrease semglee to 3 units nightly.  Sliding scale insulin.  Check blood sugars before meals and at bedtime. Monitor closely as PO intake improves. Recommended avoiding added sugars and minimizing bread, pasta, and rice in her diet. Decrease CBG checks to AC and d/c HS sliding scale. Increase Semglee to 6U Recent Labs    09/18/22 1154 09/18/22 1623 09/18/22 2110  GLUCAP 225* 245* 265*     11.  History of seizure disorder.  continue Keppra 500 mg twice daily 12.Myelogenous leukemia.  In remission.  Follow-up outpatient with Dr. [name] 13.  Persistent atrial fibrillation. Discussed with cardiology holding low dose eliquis until XR to assess for hematoma.  Cardiac rate controlled 14.  CAD with stenting.  continue Plavix.  No chest pain or shortness of breath 15.  Chronic diastolic congestive heart failure.  Echocardiogram 04/17/2022 ejection fraction 60 to 65%.  LOSARTAN and JARDIANCE currently on hold.   Resume as needed. Increase torsemide back to home dose of '40mg'$  daily. Discussed with patient. Weights improved, swelling has improved, discussed with patient Filed Weights   09/17/22 0500 09/18/22 0500 09/19/22 0605  Weight: 77.4 kg 79.1 kg 77.4 kg     16.  History of uterine breast cancer.  Follow-up outpatient. 17.  Hyperlipidemia.  Lipitor '40mg'$  daily 18.  Constipation.  Senokot twice daily, miralax daily. Molasses enema on 10/9 ordered.  19.  COPD with remote history of tobacco use.  Duoneb PRN.  Check oxygen saturations every shift. Goal sat is 88-92%, adjusted order 20.  Acute urinary retention.  continue Flomax.   21. Indigestion             -Maalox prn 22. Low Vitamin D             -continue Ergocalciferol 50,000 units every 7 days for 7 doses. 23. Pancreas head mass. follows with Dr. Ardis Hughs 24. Obesity BMI 31.73-->32.09: provided dietary eduction. Increase torsemide given increase in weight.  25. 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  26. HTN: increase toprol XL to '25mg'$  HS, improved control 27. UTI: d/c Jardiance, start keflex, f/u UC    LOS: 14 days A FACE TO FACE EVALUATION WAS PERFORMED  [name] [name] 09/19/2022, 9:34 AM

## 2022-09-19 NOTE — Progress Notes (Signed)
Physical Therapy Session Note  Patient Details  Name: YARELIZ THORSTENSON MRN: [id number] Date of Birth: 1941-04-16  Today's Date: 09/19/2022 PT Individual Time: 1445-1516 PT Individual Time Calculation (min): 31 min   Short Term Goals: Week 2:  PT Short Term Goal 1 (Week 2): pt will transfer sit<>supine with min A consistantly PT Short Term Goal 1 - Progress (Week 2): Progressing toward goal PT Short Term Goal 2 (Week 2): Pt will perform sit<>stands using LRAD with min assist PT Short Term Goal 2 - Progress (Week 2): Met PT Short Term Goal 3 (Week 2): Pt will ambulate at least 71ft using LRAD with min assist PT Short Term Goal 3 - Progress (Week 2): Met  Skilled Therapeutic Interventions/Progress Updates: Pt presents sitting in w/c and agreeable to returning to bed and performing LE there ex.  Pt transfers sit to stand w/ min A but verbal cues.  Pt amb x 3' to bed and transferred stand to sit w/ min A w/ LLE positioned for comfort.  Pt required mod A for Les sit to sidelying and then to supine.  Pt performed 3 x 10 AP, GS and QS, w/ education on breathing techniques.  Pt required rest breaks 2/2 fatigue.  Pt remained in bed w/ bed alarm on and all needs in reach.     Therapy Documentation Precautions:  Precautions Precautions: Fall, Other (comment) Precaution Comments: monitor SpO2 to maintain >92% (up to 2L of O2 via nasal cannula as needed), hx of seizure disorder, no R LE ROM restrictions Restrictions Weight Bearing Restrictions: Yes RLE Weight Bearing: Weight bearing as tolerated General:   Vital Signs: Therapy Vitals Temp: 98.4 F (36.9 C) Temp Source: Oral Pulse Rate: 68 Resp: 18 BP: (!) 143/46 Patient Position (if appropriate): Sitting Oxygen Therapy SpO2: 93 % O2 Device: Room Air Pain:0/10 w/o activity, 7/10 w/       Therapy/Group: Individual Therapy  Ladoris Gene 09/19/2022, 3:41 PM

## 2022-09-19 NOTE — Progress Notes (Signed)
Physical Therapy Weekly Progress Note  Patient Details  Name: [name] MRN: [id number] Date of Birth: 27-Jul-1941  Beginning of progress report period: September 06, 2022 End of progress report period: September 19, 2022  Today's Date: 09/19/2022 PT Individual Time: 1100-1200 PT Individual Time Calculation (min): 60 min   Patient has met 2 of 3 short term goals. Pt demonstrates gradual progress towards long term goals. Pt has been more motivated to push through pain and is now on a scheduled pain regimen. Pt currently requires mod A for bed mobility (however, has been able to transfer supine<>sitting EOB with as little as CGA), min A/CGA for sit<>stands with RW, CGA for stand<>pivot transfers, CGA/light min A to ambulate 71f with RW, and supervision for WC mobility up to 1530f Pt continues to be limited by high pain levels in R hip, edema in LEs (although has improved significantly), generalized weakness/deconditioning, and decreased balance. Goals have been upgraded to supervision overall.   Patient continues to demonstrate the following deficits muscle weakness and muscle joint tightness, decreased cardiorespiratoy endurance and decreased oxygen support, and decreased standing balance, decreased postural control, and decreased balance strategies and therefore will continue to benefit from skilled PT intervention to increase functional independence with mobility.  Patient progressing toward long term goals..  Continue plan of care.  PT Short Term Goals Week 2:  PT Short Term Goal 1 (Week 2): pt will transfer sit<>supine with min A consistantly PT Short Term Goal 1 - Progress (Week 2): Progressing toward goal PT Short Term Goal 2 (Week 2): Pt will perform sit<>stands using LRAD with min assist PT Short Term Goal 2 - Progress (Week 2): Met PT Short Term Goal 3 (Week 2): Pt will ambulate at least 2039fsing LRAD with min assist PT Short Term Goal 3 - Progress (Week 2): Met Week 3:  PT  Short Term Goal 1 (Week 3): pt will perfom sit<>stands with LRAD and CGA consistantly PT Short Term Goal 2 (Week 3): pt will ambulate 90f76fth LRAD and CGA PT Short Term Goal 3 (Week 3): pt will perform simulated car transfer with LRAD and min A  Skilled Therapeutic Interventions/Progress Updates:  Ambulation/gait training;Community reintegration;DME/adaptive equipment instruction;Neuromuscular re-education;Psychosocial support;Stair training;UE/LE Strength taining/ROM;Wheelchair propulsion/positioning;Balance/vestibular training;Discharge planning;Functional electrical stimulation;Pain management;Skin care/wound management;Therapeutic Activities;UE/LE Coordination activities;Disease management/prevention;Functional mobility training;Patient/family education;Splinting/orthotics;Therapeutic Exercise;Visual/perceptual remediation/compensation   Today's Interventions: Received pt sitting in recliner, pt agreeable to PT treatment, and reported pain 7/10 in R hip (premedicated). Pt reporting issues with night NT - notified charge nurse of concerns. Session with emphasis on functional mobility/transfers, generalized strengthening and endurance, dynamic standing balance/coordination, toileting, and gait training. Pt transferred sit<>stand with RW and min A and ambulated 20ft70fh RW and CGA fading to close supervision. Pt performed WC mobility 190ft 12fg BUE and supervision to main therapy gym with emphasis on UE strength and cardiovascular endurance. [facility] and light min A and pt ambulated additional 25ft w7fRW and close supervision with WC follow. Pt then stated "I can't do anymore" and politely declined any further standing/walking but was agreeable to seated UE exercises. Pt performed the following exercises with emphasis on UE strength: -seated trunk rotation with 4lb medicine ball 2x20 bilaterally -single arm bicep curls with 4lb dumbbell 2x10 bilaterally -single arm hammer curls with  4lb dumbbell 2x10 bilaterally  -single arm overhead chest press with 3lb dumbbell 2x10 bilaterally Returned to room and pt reported urge to void and transferred to/from toilet with bedside commode over  top with RW and CGA (mod A to stand from [facility] and min A to stand from commode). Pt required mod A for cltohing management and able to void/perform hygeiene management without assist. Concluded session with pt sitting in [facility] with all needs within reach.   Therapy Documentation Precautions:  Precautions Precautions: Fall, Other (comment) Precaution Comments: monitor SpO2 to maintain >92% (up to 2L of O2 via nasal cannula as needed), hx of seizure disorder, no R LE ROM restrictions Restrictions Weight Bearing Restrictions: Yes RLE Weight Bearing: Weight bearing as tolerated  Therapy/Group: Individual Therapy Alfonse Alpers PT, DPT  09/19/2022, 7:20 AM

## 2022-09-20 LAB — GLUCOSE, CAPILLARY
Glucose-Capillary: 140 mg/dL — ABNORMAL HIGH (ref 70–99)
Glucose-Capillary: 131 mg/dL — ABNORMAL HIGH (ref 70–99)
Glucose-Capillary: 162 mg/dL — ABNORMAL HIGH (ref 70–99)
Glucose-Capillary: 207 mg/dL — ABNORMAL HIGH (ref 70–99)

## 2022-09-20 LAB — URINE CULTURE: Culture: >=100000 — AB

## 2022-09-20 MED ORDER — CEPHALEXIN 250 MG PO CAPS
500.0000 mg | ORAL_CAPSULE | Freq: Two times a day (BID) | ORAL | Status: AC
  Administered 2022-09-20 – 2022-09-23 (×7): 500 mg via ORAL
  Filled 2022-09-20 (×7): qty 2

## 2022-09-20 NOTE — Plan of Care (Signed)
  Problem: Consults Goal: RH GENERAL PATIENT EDUCATION Description: See Patient Education module for education specifics. Outcome: Progressing   Problem: RH BOWEL ELIMINATION Goal: RH STG MANAGE BOWEL WITH ASSISTANCE Description: STG Manage Bowel with mod I Assistance. Outcome: Progressing Goal: RH STG MANAGE BOWEL W/MEDICATION W/ASSISTANCE Description: STG Manage Bowel with Medication with mod I Assistance. Outcome: Progressing   Problem: RH SKIN INTEGRITY Goal: RH STG SKIN FREE OF INFECTION/BREAKDOWN Description: W min assist Outcome: Progressing Goal: RH STG ABLE TO PERFORM INCISION/WOUND CARE W/ASSISTANCE Description: STG Able To Perform Incision/Wound Care With min  Assistance. Outcome: Progressing   Problem: RH SAFETY Goal: RH STG ADHERE TO SAFETY PRECAUTIONS W/ASSISTANCE/DEVICE Description: STG Adhere to Safety Precautions With cues Assistance/Device. Outcome: Progressing   Problem: RH PAIN MANAGEMENT Goal: RH STG PAIN MANAGED AT OR BELOW PT'S PAIN GOAL Description: < 4 with prns Outcome: Progressing   Problem: RH KNOWLEDGE DEFICIT GENERAL Goal: RH STG INCREASE KNOWLEDGE OF SELF CARE AFTER HOSPITALIZATION Description: Patient will be able to manage care; direct others to assist using handouts and educational resources independently Outcome: Progressing   Problem: Education: Goal: Verbalization of understanding the information provided (i.e., activity precautions, restrictions, etc) will improve Outcome: Progressing Goal: Individualized Educational Video(s) Outcome: Progressing   Problem: Activity: Goal: Ability to ambulate and perform ADLs will improve Outcome: Progressing   Problem: Clinical Measurements: Goal: Postoperative complications will be avoided or minimized Outcome: Progressing   Problem: Self-Concept: Goal: Ability to maintain and perform role responsibilities to the fullest extent possible will improve Outcome: Progressing   Problem: Pain  Management: Goal: Pain level will decrease Outcome: Progressing   Problem: Education: Goal: Ability to describe self-care measures that may prevent or decrease complications (Diabetes Survival Skills Education) will improve Outcome: Progressing Goal: Individualized Educational Video(s) Outcome: Progressing   Problem: Coping: Goal: Ability to adjust to condition or change in health will improve Outcome: Progressing   Problem: Fluid Volume: Goal: Ability to maintain a balanced intake and output will improve Outcome: Progressing   Problem: Health Behavior/Discharge Planning: Goal: Ability to identify and utilize available resources and services will improve Outcome: Progressing Goal: Ability to manage health-related needs will improve Outcome: Progressing   Problem: Metabolic: Goal: Ability to maintain appropriate glucose levels will improve Outcome: Progressing   Problem: Nutritional: Goal: Maintenance of adequate nutrition will improve Outcome: Progressing Goal: Progress toward achieving an optimal weight will improve Outcome: Progressing   Problem: Skin Integrity: Goal: Risk for impaired skin integrity will decrease Outcome: Progressing   Problem: Tissue Perfusion: Goal: Adequacy of tissue perfusion will improve Outcome: Progressing

## 2022-09-20 NOTE — Progress Notes (Signed)
Occupational Therapy Session Note  Patient Details  Name: [name] MRN: [id number] Date of Birth: May 09, 1941  Today's Date: 09/20/2022 OT Individual Time: 1002-1045  & 1250-1335 OT Individual Time Calculation (min): 43 min & 45 min   Short Term Goals: Week 2:  OT Short Term Goal 1 (Week 2): Patient to perform bathing with Mod A and AE as needed OT Short Term Goal 2 (Week 2): Patient to perform bathing with Mod A and AE as needed OT Short Term Goal 3 (Week 2): Pt will demonstrate increased AROM of RLE /R foot clearance during toilet transfers with no physical assist needed for RLE management  Skilled Therapeutic Interventions/Progress Updates:  Session 1 Skilled OT intervention completed with focus on tub/shower transfers and d/c preparation. Pt received seated in w/c, indicated 5/10 pain in R hip, reporting that she was pre-medicated prior to session and pain was "tolerable." Offered rest breaks and repositioning for pain reduction throughout.  Pt was received on 2L O2 with SPO2 at 100%. Pt has been tolerating RA for past several days and when questioned about why she was wearing the [place], pt indicated she felt SOB. Doffed [employer], with pt on RA, with SPO2 at 93%. Remained on RA for duration of session at 95% SPO2. Re-educated pt about COPD and CHF that may make breathing harder, but to avoid using supplemental O2 if possible vs breathing treatments/pursed lip breathing to help with the comfort aspect of breathing, unless she really needs it to prepare her for discharge. Discussed potential need to have portable O2 sent home with her if she continues to fluctuate. Did leave the [place] on 1 L next to bedside incase pt felt SOB and needed the oxygen though (which pt has independently put on before in similar manner).  Transported dependently in w/c <> ADL bathroom for time. Education provided on DME available such as tub bench that can be purchased for use at home, suggested use of [facility] for ease of  bathing, shower curtain management to prevent water spillage, minimizing stands via lateral leans for pericare, use of grab bars/installation as well as effective safety strategies for exiting the shower to eliminate falls. Pt was able to return demo ambulatory transfer at the [facility] level using RW into bathroom, then mod A sit pivot <> tub bench. Cues needed for sequencing but most physical assist needed managing BLE due to pain/weakness. Discussed her husband's limitation with assisting at the mod A level vs supervision, and that sponge bathing would be a safer/more independent option for her at this time due to the complexity of getting legs in and out and setting up clothes etc. Pt also indicated she doesn't think the RW will fit into bathroom with therapist offering suggestions on use of BSC for toileting outside of bathroom in that case as pt will not be at an ambulatory level without a device by time of discharge.  Back in room, therapist assisted pt to commode with 4 ft ambulatory transfer at [facility] level, continent of void, min A sit > stand and CGA for toileting tasks. Pt remained seated in w/c, with BLE elevated for edema management, and all needs in reach at end of session.  Session 2 Skilled OT intervention completed with focus on toileting needs, functional ambulatory endurance in prep for home management tasks. Pt received in stance with nursing assisting pt with transfer to [facility]. Therapist took over at direct care handoff. Pt did not indicate number for pain, however reported soreness from legs being  elevated in leg rests between sessions. Discussed options for seating at home for elevation/comfort as pt indicates she doesn't have recliner or couch but plans to use w/c at home.   Received on RA and remained with SPO2 WNLs.   Mod A sit > stand using RW, then completed pericare and donning of pants with overall min A for time. CGA stand pivot to w/c using RW.  Transported to hallway, sit > stand  with initial mod A fading to min A on 2nd trial with cues for BLE positioning and pushing up with UE. With CGA, pt ambulated 25 ft + 25 ft using RW, with pursed lip breathing strategies. Pt reports she tends to hold her breath and notices her breathing gets more tired before her leg or body does.  Back in room, pt completed min A stand pivot with RW to EOB, doffed pants, shoes and TEDS with total A for time. Max A bed mobility for BLE management via helicopter method for pain relief. In Trendelenburg position pt is able to bridge and pull self up toward [facility] with supervision. Remained upright in bed, with BLE elevated, R hip with heating pad applied for pain reduction, and all needs in reach at end of session.    Therapy Documentation Precautions:  Precautions Precautions: Fall, Other (comment) Precaution Comments: monitor SpO2 to maintain >92% (up to 2L of O2 via nasal cannula as needed), hx of seizure disorder, no R LE ROM restrictions Restrictions Weight Bearing Restrictions: Yes RLE Weight Bearing: Weight bearing as tolerated    Therapy/Group: Individual Therapy  Blase Mess, MS, OTR/L  09/20/2022, 1:46 PM

## 2022-09-20 NOTE — Progress Notes (Signed)
PROGRESS NOTE   Subjective/Complaints: No new complaints this morning. She feels she is doing well Shows me a tissue with some phlegm production. Michela Pitcher this has occurred once.   ROS:   Pt denies SOB, abd pain, CP, N/V/C/D, and vision changes, +hip pain- improved, +bilateral lower extremity swelling- overall improved, constipated improved, +urinary frequency, +one time phlegm production  Objective:   No results found. No results for input(s): "WBC", "HGB", "HCT", "PLT" in the last 72 hours.  No results for input(s): "NA", "K", "CL", "CO2", "GLUCOSE", "BUN", "CREATININE", "CALCIUM" in the last 72 hours.   Intake/Output Summary (Last 24 hours) at 09/20/2022 1458 Last data filed at 09/19/2022 2023 Gross per 24 hour  Intake 250 ml  Output --  Net 250 ml        Physical Exam: Vital Signs Blood pressure (!) 139/41, pulse 63, temperature 98.3 F (36.8 C), temperature source Oral, resp. rate 18, weight 78.8 kg, SpO2 97 %.    General: awake, alert, appropriate, BMI 31.20; lying in bed watching TV; NAD HENT: [place], AT , conjugate gaze; oropharynx moist CV: IIR, HR 68, no JVD Pulmonary: CTA B/L; no W/R/R- good air movement, breathing comfortably on room air GI: soft, NT, ND, (+)BS Psychiatric: appropriate- interactive Neurological: Ox3  No clubbing, cyanosis. 2+ b/l LE edema. Pulses are 2+ Psych: Pt's affect is a little flat. Pt is cooperative Neuro:  Alert and oriented x4,follows commands, CN 2-12 intact  PE from prior encounters: Skin: Clean and intact without signs of breakdown R hip with 2 border foam dressings- CDI Sensation decreased to LT in b/l LE in stocking glove distribution, normal insight and judgement Strength 5/5 in b/l UE Strength 5/5 in LLE Strength 1-2/5 hip flexion and knee extension, 4/5 ankle PF and DF  FTN intact b/l Musculoskeletal:  Normal bulk and tone.  R hip appropriately tender, ecchymosis  along R thigh Ambulating 3 feet minA Bilateral lower extremity 1-2 edema: mildly increased today TTP low back GU: total A for pericare, continent  Assessment/Plan: 1. Functional deficits which require 3+ hours per day of interdisciplinary therapy in a comprehensive inpatient rehab setting. Physiatrist is providing close team supervision and 24 hour management of active medical problems listed below. Physiatrist and rehab team continue to assess barriers to discharge/monitor patient progress toward functional and medical goals  Care Tool:  Bathing    Body parts bathed by patient: Right arm, Left arm, Chest, Abdomen, Face, Front perineal area, Buttocks, Right upper leg, Left upper leg, Right lower leg, Left lower leg   Body parts bathed by helper: Front perineal area, Buttocks, Right upper leg, Left upper leg, Right lower leg, Left lower leg     Bathing assist Assist Level: Contact Guard/Touching assist     Upper Body Dressing/Undressing Upper body dressing   What is the patient wearing?: Pull over shirt    Upper body assist Assist Level: Set up assist    Lower Body Dressing/Undressing Lower body dressing      What is the patient wearing?: Pants     Lower body assist Assist for lower body dressing: Supervision/Verbal cueing     Toileting Toileting    Toileting assist Assist  for toileting: Contact Guard/Touching assist     Transfers Chair/bed transfer  Transfers assist     Chair/bed transfer assist level: Contact Guard/Touching assist Chair/bed transfer assistive device: Programmer, multimedia   Ambulation assist      Assist level: Contact Guard/Touching assist Assistive device: Walker-rolling Max distance: 51f   Walk 10 feet activity   Assist  Walk 10 feet activity did not occur: Safety/medical concerns  Assist level: Contact Guard/Touching assist Assistive device: Walker-rolling   Walk 50 feet activity   Assist Walk 50 feet with 2  turns activity did not occur: Safety/medical concerns         Walk 150 feet activity   Assist Walk 150 feet activity did not occur: Safety/medical concerns         Walk 10 feet on uneven surface  activity   Assist Walk 10 feet on uneven surfaces activity did not occur: Safety/medical concerns         Wheelchair     Assist Is the patient using a wheelchair?: Yes Type of Wheelchair: Manual    Wheelchair assist level: Supervision/Verbal cueing Max wheelchair distance: 1566f   Wheelchair 50 feet with 2 turns activity    Assist        Assist Level: Supervision/Verbal cueing   Wheelchair 150 feet activity     Assist      Assist Level: Supervision/Verbal cueing   Blood pressure (!) 139/41, pulse 63, temperature 98.3 F (36.8 C), temperature source Oral, resp. rate 18, weight 78.8 kg, SpO2 97 %.  Medical Problem List and Plan: 1. Functional deficits secondary to right intertrochanteric hip fracture after mechanical fall.  Status post intramedullary nailing 08/30/2022.  Weightbearing as tolerated, no ROM restrictions              -patient may shower, soap and water only             -ELOS/Goals: 14 days             -Sutures removed 09/12/22            Continue CIR- PT and OT  Therapy notes reviewed 2.  Atrial fibrillation: continue Eliquis, toprol XL restarted, will decrease to 12.'5mg'$  HS             -antiplatelet therapy: Plavix 75 mg daily             - RLE duplex ordered 9/30 for pain, edema post-op - negative 3. Pain Management: continue Robaxin and oxycodone as needed. Increase Robaxin frequency to q4H prn. Ordered ice to hip three times per day for 15 minutes each. Increase scheduled tylenol to 1,'0000mg'$  TID- changed start time to 5am. Schedule oxycodone '10mg'$  daily at 7:30am prior to therapy. Start vitamin C supplement to help with pain sensitivity. Provided with pain relief journal 4. Anxiety: continue Ativan 0.5 mg every 6 hours as needed anxiety.   Provide emotional support             -antipsychotic agents: N/A 5. Neuropsych/cognition: This patient is capable of making decisions on her own behalf. 6. Skin/Wound Care: Routine skin checks 7. Fluids/Electrolytes/Nutrition: Routine in and outs with follow-up chemistries 8.  Acute blood loss anemia. Hgb down to 7.6, monitor weekly 9.  AKI on CKD stage III.  Baseline creatinine 1.5-1.9.  Latest follow-up creatinine 1.80.  Follow-up renal services as needed.  Renal ultrasound negative. Currently at baseline, weight stable, will decrease torsemide back to '20mg'$  daily. Monitor labs weekly. 10.  Diabetes  mellitus.  Sliding scale insulin. Check blood sugars before meals and at bedtime. Monitor closely as PO intake improves. Recommended avoiding added sugars and minimizing bread, pasta, and rice in her diet. Decrease CBG checks to AC and d/c HS sliding scale. Increase Semglee to 6U. Jardiance d/ced due to UTI Recent Labs    09/19/22 2056 09/20/22 0617 09/20/22 1148  GLUCAP 164* 140* 131*     11.  History of seizure disorder.  continue Keppra 500 mg twice daily 12.Myelogenous leukemia.  In remission.  Follow-up outpatient with Dr. [name] 13.  Persistent atrial fibrillation. Discussed with cardiology holding low dose eliquis until XR to assess for hematoma.  Cardiac rate controlled 14.  CAD with stenting.  continue Plavix.  No chest pain or shortness of breath 15.  Chronic diastolic congestive heart failure.  Echocardiogram 04/17/2022 ejection fraction 60 to 65%.  LOSARTAN and JARDIANCE currently on hold.   Resume as needed. Increase torsemide back to home dose of '40mg'$  daily. Discussed with patient. Weights improved, swelling has improved, discussed with patient Filed Weights   09/18/22 0500 09/19/22 0605 09/20/22 0500  Weight: 79.1 kg 77.4 kg 78.8 kg     16.  History of uterine breast cancer.  Follow-up outpatient. 17.  Hyperlipidemia.  Lipitor '40mg'$  daily 18.  Constipation.  Senokot twice  daily, miralax daily. Molasses enema on 10/9 ordered.  19.  COPD with remote history of tobacco use.  Duoneb PRN. Check oxygen saturations every shift. Goal sat is 88-92%, adjusted order 20.  Acute urinary retention.  continue Flomax.   21. Indigestion             -Maalox prn 22. Low Vitamin D             -continue Ergocalciferol 50,000 units every 7 days for 7 doses. 23. Pancreas head mass. follows with Dr. Ardis Hughs 24. Obesity BMI 31.73-->32.09: provided dietary eduction. Increase torsemide given increase in weight.  25. 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  26. HTN: toprol decreased back to 12.'5mg'$  given low pressures, improved control 27. UTI: d/c Jardiance, start keflex, UC shows >100,000 E coli and Klebsiella, f/u sensitivities    LOS: 15 days A FACE TO FACE EVALUATION WAS PERFORMED  Martha Clan P Fatiha Guzy 09/20/2022, 2:58 PM

## 2022-09-20 NOTE — Progress Notes (Signed)
Physical Therapy Session Note  Patient Details  Name: [name] MRN: [id number] Date of Birth: 27-Feb-1941  Today's Date: 09/20/2022 PT Individual Time: 4782-9562 PT Individual Time Calculation (min): 41 min   Short Term Goals: Week 1:  PT Short Term Goal 1 (Week 1): Pt will perform supine<>sit with min assist consistently PT Short Term Goal 1 - Progress (Week 1): Met PT Short Term Goal 2 (Week 1): Pt will perform sit<>stands using LRAD with min assist PT Short Term Goal 2 - Progress (Week 1): Progressing toward goal PT Short Term Goal 3 (Week 1): Pt will perform bed<>chair transfers using LRAD with min assist PT Short Term Goal 3 - Progress (Week 1): Met PT Short Term Goal 4 (Week 1): Pt will ambulate at least 15f using LRAD with min assist (+2 for safety as needed) PT Short Term Goal 4 - Progress (Week 1): Progressing toward goal Week 2:  PT Short Term Goal 1 (Week 2): pt will transfer sit<>supine with min A consistantly PT Short Term Goal 1 - Progress (Week 2): Progressing toward goal PT Short Term Goal 2 (Week 2): Pt will perform sit<>stands using LRAD with min assist PT Short Term Goal 2 - Progress (Week 2): Met PT Short Term Goal 3 (Week 2): Pt will ambulate at least 265fusing LRAD with min assist PT Short Term Goal 3 - Progress (Week 2): Met  Skilled Therapeutic Interventions/Progress Updates:  Patient seated EOB on entrance to room. Patient alert and agreeable to PT session. No O2 donned.   Patient with no pain complaint at start of session.  SpO2 checked at bedside with reading at 93% and pulse = 68 bpm.  Therapeutic Activity: Transfers: Pt performed sit<>stand and stand pivot transfers throughout session with CGA/ supervision from EOB and from w/c. Provided verbal cues for improved technique to reach supervision with increased forward lean.   Gait Training:  Pt ambulated across room using RW with CGA to retrieve clothes for this day. Demonstrated slow pace with  adequate step height bilaterally and flexed posture. Provided vc/ tc for increasing step height and improved posture. On return trip and upon sitting in w/c at sink, pt requests breathing treatment stating that it her breathing is difficult. Increased breathing rate and volume but pt not in distress. RN notified.  Neuromuscular Re-ed: NMR facilitated during session with focus on standing balance. Pt guided in standing retrieval of items required for dressing and for items required at sinkside. Pt's balance challenged with reach crossing body as well as to edge of BOS. No LOB and one UE hold to RW with other UE reaching. NMR performed for improvements in motor control and coordination, balance, sequencing, judgement, and self confidence/ efficacy in performing all aspects of mobility at highest level of independence.   Patient seated in w/c at end of session with brakes locked, belt alarm set, and all needs within reach. Breathing treatment administered by RN.    Therapy Documentation Precautions:  Precautions Precautions: Fall, Other (comment) Precaution Comments: monitor SpO2 to maintain >92% (up to 2L of O2 via nasal cannula as needed), hx of seizure disorder, no R LE ROM restrictions Restrictions Weight Bearing Restrictions: Yes RLE Weight Bearing: Weight bearing as tolerated General:   Vital Signs: Therapy Vitals Temp: 97.8 F (36.6 C) Temp Source: Oral Pulse Rate: 67 Resp: 17 BP: (!) 156/50 Patient Position (if appropriate): Lying Oxygen Therapy SpO2: 93 % O2 Device: Room Air Pain:  Pain complaint during ambulation in RLE and addressed  with stopping ambulation and repositioning. RN notified for pain medication request from pt.    Therapy/Group: Individual Therapy  Alger Simons PT, DPT, CSRS 09/20/2022, 6:24 PM

## 2022-09-21 LAB — GLUCOSE, CAPILLARY
Glucose-Capillary: 142 mg/dL — ABNORMAL HIGH (ref 70–99)
Glucose-Capillary: 178 mg/dL — ABNORMAL HIGH (ref 70–99)
Glucose-Capillary: 110 mg/dL — ABNORMAL HIGH (ref 70–99)
Glucose-Capillary: 144 mg/dL — ABNORMAL HIGH (ref 70–99)

## 2022-09-21 MED ORDER — INSULIN GLARGINE-YFGN 100 UNIT/ML ~~LOC~~ SOLN
7.0000 [IU] | Freq: Every day | SUBCUTANEOUS | Status: DC
  Administered 2022-09-22 – 2022-09-24 (×3): 7 [IU] via SUBCUTANEOUS
  Filled 2022-09-21 (×3): qty 0.07

## 2022-09-21 NOTE — Progress Notes (Signed)
PROGRESS NOTE   Subjective/Complaints: Sleepy this morning Patient's chart reviewed- No issues reported overnight Vitals signs stable except for elevated SBP and low DBP  ROS:   Pt denies SOB, abd pain, CP, N/V/C/D, and vision changes, +hip pain- improved, +bilateral lower extremity swelling- overall improved, constipated improved, +urinary frequency, +one time phlegm production  Objective:   No results found. No results for input(s): "WBC", "HGB", "HCT", "PLT" in the last 72 hours.  No results for input(s): "NA", "K", "CL", "CO2", "GLUCOSE", "BUN", "CREATININE", "CALCIUM" in the last 72 hours.   Intake/Output Summary (Last 24 hours) at 09/21/2022 0937 Last data filed at 09/21/2022 0742 Gross per 24 hour  Intake 590 ml  Output --  Net 590 ml        Physical Exam: Vital Signs Blood pressure (!) 142/51, pulse 91, temperature 98.1 F (36.7 C), temperature source Oral, resp. rate 18, weight 78.8 kg, SpO2 98 %.    General: awake, alert, appropriate, BMI 31.77; lying in bed watching TV; NAD HENT: [place], AT , conjugate gaze; oropharynx moist CV: IIR, HR 68, no JVD Pulmonary: CTA B/L; no W/R/R- good air movement, breathing comfortably on room air GI: soft, NT, ND, (+)BS Psychiatric: appropriate- interactive Neurological: Ox3  No clubbing, cyanosis. 2+ b/l LE edema. Pulses are 2+ Psych: Pleasant and cheerful most of the time. Pt is cooperative Neuro:  Alert and oriented x4,follows commands, CN 2-12 intact Skin: Clean and intact without signs of breakdown R hip with 2 border foam dressings- CDI Sensation decreased to LT in b/l LE in stocking glove distribution, normal insight and judgement Strength 5/5 in b/l UE Strength 5/5 in LLE Strength 1-2/5 hip flexion and knee extension, 4/5 ankle PF and DF  FTN intact b/l Musculoskeletal:  Normal bulk and tone.  R hip appropriately tender, ecchymosis along R thigh Ambulating 3  feet minA Bilateral lower extremity 1-2 edema: mildly increased today TTP low back GU: total A for pericare, continent  Assessment/Plan: 1. Functional deficits which require 3+ hours per day of interdisciplinary therapy in a comprehensive inpatient rehab setting. Physiatrist is providing close team supervision and 24 hour management of active medical problems listed below. Physiatrist and rehab team continue to assess barriers to discharge/monitor patient progress toward functional and medical goals  Care Tool:  Bathing    Body parts bathed by patient: Right arm, Left arm, Chest, Abdomen, Face, Front perineal area, Buttocks, Right upper leg, Left upper leg, Right lower leg, Left lower leg   Body parts bathed by helper: Front perineal area, Buttocks, Right upper leg, Left upper leg, Right lower leg, Left lower leg     Bathing assist Assist Level: Contact Guard/Touching assist     Upper Body Dressing/Undressing Upper body dressing   What is the patient wearing?: Pull over shirt    Upper body assist Assist Level: Set up assist    Lower Body Dressing/Undressing Lower body dressing      What is the patient wearing?: Pants     Lower body assist Assist for lower body dressing: Supervision/Verbal cueing     Toileting Toileting    Toileting assist Assist for toileting: Contact Guard/Touching assist  Transfers Chair/bed transfer  Transfers assist     Chair/bed transfer assist level: Contact Guard/Touching assist Chair/bed transfer assistive device: Programmer, multimedia   Ambulation assist      Assist level: Contact Guard/Touching assist Assistive device: Walker-rolling Max distance: 36f   Walk 10 feet activity   Assist  Walk 10 feet activity did not occur: Safety/medical concerns  Assist level: Contact Guard/Touching assist Assistive device: Walker-rolling   Walk 50 feet activity   Assist Walk 50 feet with 2 turns activity did not  occur: Safety/medical concerns         Walk 150 feet activity   Assist Walk 150 feet activity did not occur: Safety/medical concerns         Walk 10 feet on uneven surface  activity   Assist Walk 10 feet on uneven surfaces activity did not occur: Safety/medical concerns         Wheelchair     Assist Is the patient using a wheelchair?: Yes Type of Wheelchair: Manual    Wheelchair assist level: Supervision/Verbal cueing Max wheelchair distance: 1554f   Wheelchair 50 feet with 2 turns activity    Assist        Assist Level: Supervision/Verbal cueing   Wheelchair 150 feet activity     Assist      Assist Level: Supervision/Verbal cueing   Blood pressure (!) 142/51, pulse 91, temperature 98.1 F (36.7 C), temperature source Oral, resp. rate 18, weight 78.8 kg, SpO2 98 %.  Medical Problem List and Plan: 1. Functional deficits secondary to right intertrochanteric hip fracture after mechanical fall.  Status post intramedullary nailing 08/30/2022.  Weightbearing as tolerated, no ROM restrictions              -patient may shower, soap and water only             -ELOS/Goals: 14 days             -Sutures removed 09/12/22            Continue CIR- PT and OT  Therapy notes reviewed 2.  Atrial fibrillation: continue Eliquis, toprol XL restarted, will decrease to 12.'5mg'$  HS             -antiplatelet therapy: Plavix 75 mg daily             - RLE duplex ordered 9/30 for pain, edema post-op - negative 3. Pain Management: continue Robaxin and oxycodone as needed. Increase Robaxin frequency to q4H prn. Ordered ice to hip three times per day for 15 minutes each. Increase scheduled tylenol to 1,'0000mg'$  TID- changed start time to 5am. Schedule oxycodone '10mg'$  daily at 7:30am prior to therapy. Start vitamin C supplement to help with pain sensitivity. Provided with pain relief journal 4. Anxiety: continue Ativan 0.5 mg every 6 hours as needed anxiety.  Provide emotional  support             -antipsychotic agents: N/A 5. Neuropsych/cognition: This patient is capable of making decisions on her own behalf. 6. Skin/Wound Care: Routine skin checks 7. Fluids/Electrolytes/Nutrition: Routine in and outs with follow-up chemistries 8.  Acute blood loss anemia. Hgb down to 7.6, monitor weekly 9.  AKI on CKD stage III.  Baseline creatinine 1.5-1.9.  Latest follow-up creatinine 1.80.  Follow-up renal services as needed.  Renal ultrasound negative. Currently at baseline, weight stable, will decrease torsemide back to '20mg'$  daily. Monitor labs weekly. 10.  Diabetes mellitus.  Sliding scale insulin. Check blood sugars before  meals and at bedtime. Monitor closely as PO intake improves. Recommended avoiding added sugars and minimizing bread, pasta, and rice in her diet. Decrease CBG checks to AC and d/c HS sliding scale.Increase Semglee to 7U. Jardiance d/ced due to UTI Recent Labs    09/20/22 1628 09/20/22 2109 09/21/22 0741  GLUCAP 162* 207* 142*     11.  History of seizure disorder.  continue Keppra 500 mg twice daily 12.Myelogenous leukemia.  In remission.  Follow-up outpatient with Dr. [name] 13.  Persistent atrial fibrillation. Discussed with cardiology holding low dose eliquis until XR to assess for hematoma.  Cardiac rate controlled 14.  CAD with stenting.  continue Plavix.  No chest pain or shortness of breath 15.  Chronic diastolic congestive heart failure.  Echocardiogram 04/17/2022 ejection fraction 60 to 65%.  LOSARTAN and JARDIANCE currently on hold.   Resume as needed. Increase torsemide back to home dose of '40mg'$  daily. Discussed with patient. Weights improved, swelling has improved, discussed with patient Filed Weights   09/18/22 0500 09/19/22 0605 09/20/22 0500  Weight: 79.1 kg 77.4 kg 78.8 kg     16.  History of uterine breast cancer.  Follow-up outpatient. 17.  Hyperlipidemia.  Lipitor '40mg'$  daily 18.  Constipation.  Senokot twice daily, miralax daily.  Molasses enema on 10/9 ordered.  19.  COPD with remote history of tobacco use.  Duoneb PRN. Check oxygen saturations every shift. Goal sat is 88-92%, adjusted order 20.  Acute urinary retention.  continue Flomax.   21. Indigestion             -Maalox prn 22. Low Vitamin D             -continue Ergocalciferol 50,000 units every 7 days for 7 doses. 23. Pancreas head mass. follows with Dr. Ardis Hughs 24. Obesity BMI 31.73-->32.09: provided dietary eduction. Increase torsemide given increase in weight.  25. 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  26. HTN: toprol decreased back to 12.'5mg'$  given low pressures, improved control 27. UTI: d/c Jardiance, start keflex, UC shows >100,000 E coli and Klebsiella, f/u sensitivities    LOS: 16 days A FACE TO FACE EVALUATION WAS PERFORMED  Clide Deutscher Chrisanne Loose 09/21/2022, 9:37 AM

## 2022-09-21 NOTE — Plan of Care (Signed)
  Problem: Consults Goal: RH GENERAL PATIENT EDUCATION Description: See Patient Education module for education specifics. Outcome: Progressing   Problem: RH BOWEL ELIMINATION Goal: RH STG MANAGE BOWEL WITH ASSISTANCE Description: STG Manage Bowel with mod I Assistance. Outcome: Progressing Goal: RH STG MANAGE BOWEL W/MEDICATION W/ASSISTANCE Description: STG Manage Bowel with Medication with mod I Assistance. Outcome: Progressing   Problem: RH SKIN INTEGRITY Goal: RH STG SKIN FREE OF INFECTION/BREAKDOWN Description: W min assist Outcome: Progressing Goal: RH STG ABLE TO PERFORM INCISION/WOUND CARE W/ASSISTANCE Description: STG Able To Perform Incision/Wound Care With min  Assistance. Outcome: Progressing   Problem: RH SAFETY Goal: RH STG ADHERE TO SAFETY PRECAUTIONS W/ASSISTANCE/DEVICE Description: STG Adhere to Safety Precautions With cues Assistance/Device. Outcome: Progressing   Problem: RH PAIN MANAGEMENT Goal: RH STG PAIN MANAGED AT OR BELOW PT'S PAIN GOAL Description: < 4 with prns Outcome: Progressing   Problem: RH KNOWLEDGE DEFICIT GENERAL Goal: RH STG INCREASE KNOWLEDGE OF SELF CARE AFTER HOSPITALIZATION Description: Patient will be able to manage care; direct others to assist using handouts and educational resources independently Outcome: Progressing   Problem: Education: Goal: Verbalization of understanding the information provided (i.e., activity precautions, restrictions, etc) will improve Outcome: Progressing Goal: Individualized Educational Video(s) Outcome: Progressing   Problem: Activity: Goal: Ability to ambulate and perform ADLs will improve Outcome: Progressing   Problem: Self-Concept: Goal: Ability to maintain and perform role responsibilities to the fullest extent possible will improve Outcome: Progressing   Problem: Clinical Measurements: Goal: Postoperative complications will be avoided or minimized Outcome: Progressing   Problem: Pain  Management: Goal: Pain level will decrease Outcome: Progressing   Problem: Education: Goal: Ability to describe self-care measures that may prevent or decrease complications (Diabetes Survival Skills Education) will improve Outcome: Progressing Goal: Individualized Educational Video(s) Outcome: Progressing   Problem: Coping: Goal: Ability to adjust to condition or change in health will improve Outcome: Progressing   Problem: Coping: Goal: Ability to adjust to condition or change in health will improve Outcome: Progressing   Problem: Fluid Volume: Goal: Ability to maintain a balanced intake and output will improve Outcome: Progressing   Problem: Health Behavior/Discharge Planning: Goal: Ability to identify and utilize available resources and services will improve Outcome: Progressing Goal: Ability to manage health-related needs will improve Outcome: Progressing   Problem: Skin Integrity: Goal: Risk for impaired skin integrity will decrease Outcome: Progressing   Problem: Tissue Perfusion: Goal: Adequacy of tissue perfusion will improve Outcome: Progressing

## 2022-09-22 DIAGNOSIS — Z794 Long term (current) use of insulin: Secondary | ICD-10-CM

## 2022-09-22 DIAGNOSIS — K5900 Constipation, unspecified: Secondary | ICD-10-CM

## 2022-09-22 DIAGNOSIS — N390 Urinary tract infection, site not specified: Secondary | ICD-10-CM

## 2022-09-22 DIAGNOSIS — E1169 Type 2 diabetes mellitus with other specified complication: Secondary | ICD-10-CM

## 2022-09-22 DIAGNOSIS — N183 Chronic kidney disease, stage 3 unspecified: Secondary | ICD-10-CM

## 2022-09-22 DIAGNOSIS — I10 Essential (primary) hypertension: Secondary | ICD-10-CM

## 2022-09-22 LAB — GLUCOSE, CAPILLARY
Glucose-Capillary: 122 mg/dL — ABNORMAL HIGH (ref 70–99)
Glucose-Capillary: 130 mg/dL — ABNORMAL HIGH (ref 70–99)
Glucose-Capillary: 155 mg/dL — ABNORMAL HIGH (ref 70–99)
Glucose-Capillary: 81 mg/dL (ref 70–99)

## 2022-09-22 NOTE — Progress Notes (Signed)
Occupational Therapy Session Note  Patient Details  Name: SHANORA CHRISTENSEN MRN: [id number] Date of Birth: 01/10/1941  Today's Date: 09/22/2022 OT Individual Time: 4944-9675 OT Individual Time Calculation (min): 60 min    Short Term Goals: Week 2:  OT Short Term Goal 1 (Week 2): Patient to perform bathing with Mod A and AE as needed OT Short Term Goal 2 (Week 2): Patient to perform bathing with Mod A and AE as needed OT Short Term Goal 3 (Week 2): Pt will demonstrate increased AROM of RLE /R foot clearance during toilet transfers with no physical assist needed for RLE management  Skilled Therapeutic Interventions/Progress Updates:    Upon OT arrival, pt seated in w/c reporting "I walked a lot with PT and my pain is at a 6/10 in the leg". Pt agreeable to OT treatment session. Treatment intervention with a focus on self care retraining, UE strengthening, endurance, and standing tolerance. Pt requests to wash her face and use the bathroom. Pt was transported infornt of sink and pt washes her face with Supervision. Pt was transported via w/c with Total A and completes stand pivot transfer to toilet with CGA and performs toileting with CGA. Pt was transported to sink to complete hand hygiene with Supervision. Pt was transported with Total A to dayroom gym via w/c and sits at tabletop with 1.5lb B wrist weights donned to flip over and sort quirkle game pieces based on shape. Pt requires increased time to complete and requires min cueing to recognize errors. Game pieces were divided on tableotp and pt crosses midline to retrieve game piece and perform rainbow arc motion to return game piece to game bag one at a time on opposite side of table. Pt completes on B UE until all game pieces were returned. Minimal rest breaks required to complete task. Pt then completes sit to stand transfer with Min A to stand at tabletop to flip over playing cards and sort based on suit. Pt initially expresses reservations with  transferring but with verbal and tactile cues pt performs safe transfer. Pt stands with body against tabletop for support to use B UE to perform task. Pt able to sort all hearts first then requires seated rest break with CGA. Pt does a second sit to stand transfer demonstrating improved technique and more confidence. Pt stands with CGA to sort the clubs and requires one verbal cue to sort. Pt completes stand to sit transfer with CGA and was transported back to her room via w/c and total A. Pt was left in her w/c at end of session with all needs met and safety measures in place.   Therapy Documentation Precautions:  Precautions Precautions: Fall, Other (comment) Precaution Comments: monitor SpO2 to maintain >92% (up to 2L of O2 via nasal cannula as needed), hx of seizure disorder, no R LE ROM restrictions Restrictions Weight Bearing Restrictions: Yes RLE Weight Bearing: Weight bearing as tolerated  ADL: Grooming: Supervision/safety Where Assessed-Grooming: Sitting at sink, Wheelchair Toileting: Contact guard Where Assessed-Toileting: Bedside Commode (over toilet) Toilet Transfer: Therapist, music Method: Stand pivot Toilet Transfer Equipment: Bedside commode, Grab bars    Therapy/Group: Individual Therapy  Marvetta Gibbons 09/22/2022, 10:25 AM

## 2022-09-22 NOTE — Progress Notes (Signed)
Physical Therapy Session Note  Patient Details  Name: [name] MRN: [id number] Date of Birth: 1941/12/08  Today's Date: 09/22/2022 PT Individual Time: 0730-0840 PT Individual Time Calculation (min): 70 min   Short Term Goals: Week 2:  PT Short Term Goal 1 (Week 2): pt will transfer sit<>supine with min A consistantly PT Short Term Goal 1 - Progress (Week 2): Progressing toward goal PT Short Term Goal 2 (Week 2): Pt will perform sit<>stands using LRAD with min assist PT Short Term Goal 2 - Progress (Week 2): Met PT Short Term Goal 3 (Week 2): Pt will ambulate at least 48f using LRAD with min assist PT Short Term Goal 3 - Progress (Week 2): Met Week 3:  PT Short Term Goal 1 (Week 3): pt will perfom sit<>stands with LRAD and CGA consistantly PT Short Term Goal 2 (Week 3): pt will ambulate 54fwith LRAD and CGA PT Short Term Goal 3 (Week 3): pt will perform simulated car transfer with LRAD and min A  Skilled Therapeutic Interventions/Progress Updates:   Received pt semi-reclined in bed, pt agreeable to PT treatment, and reported pain 6/10 in R hip/knee - RN present to administer pain medication. Session with emphasis on functional mobility/transfers, generalized strengthening and endurance, and gait training. SPO2 91% on RA at rest. Donned thigh high ted hose in supine with total A for edema management and pt transferred semi-reclined<>sitting EOB with HOB elevated and supervision. Donned pants and shoes sitting EOB with max A for time management purposes and stood from elevated EOB with RW and close supervision and pt able to pull pants over hips without assist. RN arrived to change dressings while pt stood with RW and close supervision. Pt transferred stand<>pivot bed<>WC with RW and close supervision and requested breathing treatment. While pt received breathing treatment, therapist created HEP. Provided pt with HEP and educated on frequency/duration/technique for the following  exercises: -heel slides 2x10 -hip abduction 2x10 -LAQ 3x10 -hip flexion 3x10 -glute sets 3x10 -ankle circles 2x20 -seated heel raises 2x10 Pt sat in WC and combed hair and put in dentures independently, then [facility] time management purposes. Pt stood with RW and min A x 1 and CGA x 1 (cues to push up from [facility]) and ambulated 6223f 1 and [id number] with RW and CGA/fading to close supervision. SPO2 86% increasing to 91% after trial 1 and 88% increasing to 92% after trial 2, on RA. Returned to room and concluded session with pt sitting in WC, needs within reach, and seatbelt alarm on. Pt reported fatigue and increased R hip soreness at end of session - provided ice pack for pain relief.   Therapy Documentation Precautions:  Precautions Precautions: Fall, Other (comment) Precaution Comments: monitor SpO2 to maintain >92% (up to 2L of O2 via nasal cannula as needed), hx of seizure disorder, no R LE ROM restrictions Restrictions Weight Bearing Restrictions: Yes RLE Weight Bearing: Weight bearing as tolerated   Therapy/Group: Individual Therapy AnnaAlfonse Alpers DPT  09/22/2022, 6:55 AM

## 2022-09-22 NOTE — Progress Notes (Signed)
Physical Therapy Session Note  Patient Details  Name: [name] MRN: [id number] Date of Birth: [birth year]  Today's Date: 09/22/2022 PT Individual Time: 3664-4034 PT Individual Time Calculation (min): 42 min   Short Term Goals: Week 3:  PT Short Term Goal 1 (Week 3): pt will perfom sit<>stands with LRAD and CGA consistantly PT Short Term Goal 2 (Week 3): pt will ambulate 62f with LRAD and CGA PT Short Term Goal 3 (Week 3): pt will perform simulated car transfer with LRAD and min A  Skilled Therapeutic Interventions/Progress Updates:     Pt received seated in WC and agrees to therapy. Reports pain In R lower extremity > L lower extremity. PT provides gentle mobility and rest breaks to manage pain. WC transport to gym for time management. Pt performs sit to stand and stand pivot transfer to Nustep with minA and cues for hand placement, body mechanics, and positioning. Pt completes Nustep for strengthening as well as increasing ROM in pain R lower extremity. Pt completes at workload of 5 with average steps per minute ~20. PT provides cues for hand and foot placement as well as completion of full available ROM. Pt completes total of x16:00 with several brief rest breaks. Pt noted to be completing larger ROM and appears to be in less pain toward end of session on Nustep. Pt also appears to be dyspneic, with notable heavy breathing for several minutes afterward. Following extended rest break, pt ambulates x50' with RW and CGA/minA, with cues for posture and encouragement to motivate pt. WC transport back to room. Stand pivot back to bed with minA and cue for positioning. Sit to supine with minA. Pt very dyspneic upon return to semi-reclined position and requests oxygen. 2L supplemental oxygen added via nasal cannula. Pt left with alarm intact and all needs within reach.  Therapy Documentation Precautions:  Precautions Precautions: Fall, Other (comment) Precaution Comments: monitor SpO2 to  maintain >92% (up to 2L of O2 via nasal cannula as needed), hx of seizure disorder, no R LE ROM restrictions Restrictions Weight Bearing Restrictions: Yes RLE Weight Bearing: Weight bearing as tolerated    Therapy/Group: Individual Therapy  WBreck Coons PT, DPT 09/22/2022, 4:49 PM

## 2022-09-22 NOTE — Progress Notes (Signed)
PROGRESS NOTE   Subjective/Complaints: Seen in gym working with PT. She report she is walking farther. No new concerns or complaints elicited this AM.  ROS:   Pt denies SOB, abd pain,Headache, CP, N/V/C/D, and vision changes, +hip pain- improved, +bilateral lower extremity swelling- overall improved, constipated improved, +urinary frequency, +one time phlegm production  Objective:   No results found. No results for input(s): "WBC", "HGB", "HCT", "PLT" in the last 72 hours.  No results for input(s): "NA", "K", "CL", "CO2", "GLUCOSE", "BUN", "CREATININE", "CALCIUM" in the last 72 hours.  No intake or output data in the 24 hours ending 09/22/22 1143       Physical Exam: Vital Signs Blood pressure (!) 146/48, pulse 68, temperature 98 F (36.7 C), temperature source Oral, resp. rate 18, weight 76.1 kg, SpO2 98 %.    General: awake, alert, appropriate, NAD BMI 31.77; In gym working with PT HENT: [place], AT , conjugate gaze; oropharynx moist CV: IIR, HR 68, no JVD Pulmonary: CTA B/L; no W/R/R- good air movement, breathing comfortably on room air GI: soft, NT, ND, (+)BS Psychiatric: appropriate- interactive, pleasant Neurological: Ox3  No clubbing, cyanosis. 2+ b/l LE edema. Pulses are 2+ Psych: Pleasant and cheerful most of the time. Pt is cooperative Neuro:  Alert and oriented x4,follows commands, CN 2-12 grossly intact Skin: Clean and intact without signs of breakdown R hip with 2 border foam dressings- CDI Sensation decreased to LT in b/l LE in stocking glove distribution, normal insight and judgement Strength 5/5 in b/l UE Strength 5/5 in LLE Strength 1-2/5 hip flexion and knee extension, 4/5 ankle PF and DF  FTN intact b/l Musculoskeletal:  Normal bulk and tone.  R hip appropriately tender, ecchymosis along R thigh Bilateral lower extremity 1-2 edema TTP low back GU: total A for pericare,  continent  Assessment/Plan: 1. Functional deficits which require 3+ hours per day of interdisciplinary therapy in a comprehensive inpatient rehab setting. Physiatrist is providing close team supervision and 24 hour management of active medical problems listed below. Physiatrist and rehab team continue to assess barriers to discharge/monitor patient progress toward functional and medical goals  Care Tool:  Bathing    Body parts bathed by patient: Right arm, Left arm, Chest, Abdomen, Face, Front perineal area, Buttocks, Right upper leg, Left upper leg, Right lower leg, Left lower leg   Body parts bathed by helper: Front perineal area, Buttocks, Right upper leg, Left upper leg, Right lower leg, Left lower leg     Bathing assist Assist Level: Contact Guard/Touching assist     Upper Body Dressing/Undressing Upper body dressing   What is the patient wearing?: Pull over shirt    Upper body assist Assist Level: Set up assist    Lower Body Dressing/Undressing Lower body dressing      What is the patient wearing?: Pants     Lower body assist Assist for lower body dressing: Supervision/Verbal cueing     Toileting Toileting    Toileting assist Assist for toileting: Contact Guard/Touching assist     Transfers Chair/bed transfer  Transfers assist     Chair/bed transfer assist level: Supervision/Verbal cueing Chair/bed transfer assistive device: Environmental consultant  Ambulation   Ambulation assist      Assist level: Contact Guard/Touching assist Assistive device: Walker-rolling Max distance: 72f   Walk 10 feet activity   Assist  Walk 10 feet activity did not occur: Safety/medical concerns  Assist level: Contact Guard/Touching assist Assistive device: Walker-rolling   Walk 50 feet activity   Assist Walk 50 feet with 2 turns activity did not occur: Safety/medical concerns  Assist level: Contact Guard/Touching assist Assistive device: Walker-rolling     Walk 150 feet activity   Assist Walk 150 feet activity did not occur: Safety/medical concerns         Walk 10 feet on uneven surface  activity   Assist Walk 10 feet on uneven surfaces activity did not occur: Safety/medical concerns         Wheelchair     Assist Is the patient using a wheelchair?: Yes Type of Wheelchair: Manual    Wheelchair assist level: Supervision/Verbal cueing Max wheelchair distance: 1575f   Wheelchair 50 feet with 2 turns activity    Assist        Assist Level: Supervision/Verbal cueing   Wheelchair 150 feet activity     Assist      Assist Level: Supervision/Verbal cueing   Blood pressure (!) 146/48, pulse 68, temperature 98 F (36.7 C), temperature source Oral, resp. rate 18, weight 76.1 kg, SpO2 98 %.  Medical Problem List and Plan: 1. Functional deficits secondary to right intertrochanteric hip fracture after mechanical fall.  Status post intramedullary nailing 08/30/2022.  Weightbearing as tolerated, no ROM restrictions              -patient may shower, soap and water only             -ELOS/Goals: 14 days             -Sutures removed 09/12/22            Continue CIR- PT and OT  -Ambulating farther with PT (reports about 60 feet earlier) 2.  Atrial fibrillation: continue Eliquis, toprol XL restarted, will decrease to 12.'5mg'$  HS             -antiplatelet therapy: Plavix 75 mg daily             - RLE duplex ordered 9/30 for pain, edema post-op - negative 3. Pain Management: continue Robaxin and oxycodone as needed. Increase Robaxin frequency to q4H prn. Ordered ice to hip three times per day for 15 minutes each. Increase scheduled tylenol to 1,'0000mg'$  TID- changed start time to 5am. Schedule oxycodone '10mg'$  daily at 7:30am prior to therapy. Start vitamin C supplement to help with pain sensitivity. Provided with pain relief journal 4. Anxiety: continue Ativan 0.5 mg every 6 hours as needed anxiety.  Provide emotional  support             -antipsychotic agents: N/A 5. Neuropsych/cognition: This patient is capable of making decisions on her own behalf. 6. Skin/Wound Care: Routine skin checks 7. Fluids/Electrolytes/Nutrition: Routine in and outs with follow-up chemistries 8.  Acute blood loss anemia. Hgb down to 7.6, monitor weekly 9.  AKI on CKD stage III.  Baseline creatinine 1.5-1.9.  Latest follow-up creatinine 1.80.  Follow-up renal services as needed.  Renal ultrasound negative. Currently at baseline, weight stable, will decrease torsemide back to '20mg'$  daily. Monitor labs weekly.  -10/16 Scr stable at 1.81, BUN down to 36 10.  Diabetes mellitus.  Sliding scale insulin. Check blood sugars before meals and at bedtime. Monitor  closely as PO intake improves. Recommended avoiding added sugars and minimizing bread, pasta, and rice in her diet. Decrease CBG checks to AC and d/c HS sliding scale.Increase Semglee to 7U. Jardiance d/ced due to UTI Recent Labs    09/21/22 1636 09/21/22 2109 09/22/22 0652  GLUCAP 110* 144* 122*    Glucose overall well controlled  11.  History of seizure disorder.  continue Keppra 500 mg twice daily 12.Myelogenous leukemia.  In remission.  Follow-up outpatient with Dr. [name] 13.  Persistent atrial fibrillation. Discussed with cardiology holding low dose eliquis until XR to assess for hematoma.  Cardiac rate controlled 14.  CAD with stenting.  continue Plavix.  No chest pain or shortness of breath 15.  Chronic diastolic congestive heart failure.  Echocardiogram 04/17/2022 ejection fraction 60 to 65%.  LOSARTAN and JARDIANCE currently on hold.   Resume as needed. Increase torsemide back to home dose of '40mg'$  daily. Discussed with patient. Weights improved, swelling has improved, discussed with patient Filed Weights   09/19/22 0605 09/20/22 0500 09/22/22 0507  Weight: 77.4 kg 78.8 kg 76.1 kg     16.  History of uterine breast cancer.  Follow-up outpatient. 17.  Hyperlipidemia.   Lipitor '40mg'$  daily 18.  Constipation.  Senokot twice daily, miralax daily. Molasses enema on 10/9 ordered.   -10/16 LBM yesterday- having regular BMS, red streaks noted, continue to monitor 19.  COPD with remote history of tobacco use.  Duoneb PRN. Check oxygen saturations every shift. Goal sat is 88-92%, adjusted order 20.  Acute urinary retention.  continue Flomax.   21. Indigestion             -Maalox prn 22. Low Vitamin D             -continue Ergocalciferol 50,000 units every 7 days for 7 doses. 23. Pancreas head mass. follows with Dr. Ardis Hughs 24. Obesity BMI 31.73-->32.09: provided dietary eduction. Increase torsemide given increase in weight.  25. 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  26. HTN: toprol decreased back to 12.'5mg'$  given low pressures, improved control  -10/16 SBP normal to a little high, DBP low. Overall stable, continue to monitor 27. UTI: d/c Jardiance, start keflex, UC shows >100,000 E coli and Klebsiella-Both sensitive to cefazolin    LOS: 17 days A FACE TO [place] 09/22/2022, 11:43 AM

## 2022-09-22 NOTE — Progress Notes (Signed)
Occupational Therapy Session Note  Patient Details  Name: [name] MRN: [id number] Date of Birth: Jun 10, 1941  Today's Date: 09/22/2022 OT Individual Time: 1130-1200 OT Individual Time Calculation (min): 30 min    Short Term Goals: Week 2:  OT Short Term Goal 1 (Week 2): Patient to perform bathing with Mod A and AE as needed OT Short Term Goal 2 (Week 2): Patient to perform bathing with Mod A and AE as needed OT Short Term Goal 3 (Week 2): Pt will demonstrate increased AROM of RLE /R foot clearance during toilet transfers with no physical assist needed for RLE management  Skilled Therapeutic Interventions/Progress Updates: Patient seen for AE training and education on adapting IADL's to improve independence and conserve energy. Continued AE education for dressing. Patient trained on use of sock aide and shoe horn to improve independence while unable to bend to reach feet. Patient aware of using reacher for dressing. Discussed also using the reacher to improve independence with simple kitchen tasks and retrieving items from below knee level to avoid bending and reaching. Patient with good awareness of safety and reports learning ways to conserve energy throughout her battle with leukemia. Continue with skilled OT POC to achieve highest level of function with self care skills.     Therapy Documentation Precautions:  Precautions Precautions: Fall, Other (comment) Precaution Comments: monitor SpO2 to maintain >92% (up to 2L of O2 via nasal cannula as needed), hx of seizure disorder, no R LE ROM restrictions Restrictions Weight Bearing Restrictions: Yes RLE Weight Bearing: Weight bearing as tolerated   Pain:Agreeable to OT treatment with continued 5/10 LE pain.   ADL: ADL Eating: Modified independent Where Assessed-Eating: Bed level Grooming: Supervision/safety Where Assessed-Grooming: Sitting at sink, Wheelchair Upper Body Bathing: Minimal assistance Where Assessed-Upper Body  Bathing: Bed level Lower Body Bathing: Dependent Where Assessed-Lower Body Bathing: Bed level Upper Body Dressing: Moderate assistance Where Assessed-Upper Body Dressing: Edge of bed Lower Body Dressing: Dependent Where Assessed-Lower Body Dressing: Edge of bed Toileting: Contact guard Where Assessed-Toileting: Bedside Commode (over toilet) Toilet Transfer: Contact guard Toilet Transfer Method: Stand pivot Toilet Transfer Equipment: Bedside commode, Grab bars    Therapy/Group: Individual Therapy  [name] 09/22/2022, 1:26 PM

## 2022-09-23 ENCOUNTER — Other Ambulatory Visit: Payer: Self-pay | Admitting: *Deleted

## 2022-09-23 DIAGNOSIS — I10 Essential (primary) hypertension: Secondary | ICD-10-CM

## 2022-09-23 DIAGNOSIS — F419 Anxiety disorder, unspecified: Secondary | ICD-10-CM

## 2022-09-23 DIAGNOSIS — I5042 Chronic combined systolic (congestive) and diastolic (congestive) heart failure: Secondary | ICD-10-CM

## 2022-09-23 LAB — CBC
HCT: 25 % — ABNORMAL LOW (ref 36.0–46.0)
Hemoglobin: 7.9 g/dL — ABNORMAL LOW (ref 12.0–15.0)
MCH: 30.7 pg (ref 26.0–34.0)
MCHC: 31.6 g/dL (ref 30.0–36.0)
MCV: 97.3 fL (ref 80.0–100.0)
Platelets: 175 10*3/uL (ref 150–400)
RBC: 2.57 MIL/uL — ABNORMAL LOW (ref 3.87–5.11)
RDW: 13.9 % (ref 11.5–15.5)
WBC: 3.6 10*3/uL — ABNORMAL LOW (ref 4.0–10.5)
nRBC: 0 % (ref 0.0–0.2)

## 2022-09-23 LAB — GLUCOSE, CAPILLARY
Glucose-Capillary: 90 mg/dL (ref 70–99)
Glucose-Capillary: 186 mg/dL — ABNORMAL HIGH (ref 70–99)
Glucose-Capillary: 91 mg/dL (ref 70–99)

## 2022-09-23 LAB — BASIC METABOLIC PANEL
Anion gap: 8 (ref 5–15)
BUN: 44 mg/dL — ABNORMAL HIGH (ref 8–23)
CO2: 29 mmol/L (ref 22–32)
Calcium: 8.1 mg/dL — ABNORMAL LOW (ref 8.9–10.3)
Chloride: 105 mmol/L (ref 98–111)
Creatinine, Ser: 1.72 mg/dL — ABNORMAL HIGH (ref 0.44–1.00)
GFR, Estimated: 30 mL/min — ABNORMAL LOW (ref >60)
Glucose, Bld: 95 mg/dL (ref 70–99)
Potassium: 4.8 mmol/L (ref 3.5–5.1)
Sodium: 142 mmol/L (ref 135–145)

## 2022-09-23 MED ORDER — VALSARTAN 80 MG PO TABS: 80.0000 mg | ORAL_TABLET | Freq: Every day | ORAL | 0 refills | Status: DC

## 2022-09-23 MED ORDER — LORAZEPAM 0.5 MG PO TABS: ORAL_TABLET | ORAL | 0 refills | Status: DC

## 2022-09-23 MED ORDER — IPRATROPIUM-ALBUTEROL 0.5-2.5 (3) MG/3ML IN SOLN
3.0000 mL | RESPIRATORY_TRACT | Status: DC | PRN
  Administered 2022-09-23 – 2022-09-30 (×12): 3 mL via RESPIRATORY_TRACT
  Filled 2022-09-23 (×14): qty 3

## 2022-09-23 NOTE — Telephone Encounter (Signed)
Last filled by Cathlyn Parsons, PA-C

## 2022-09-23 NOTE — Progress Notes (Signed)
Physical Therapy Session Note  Patient Details  Name: [name] MRN: [id number] Date of Birth: 28-Aug-1941  Today's Date: 09/23/2022 PT Individual Time: 1400-1430 PT Individual Time Calculation (min): 30 min   Short Term Goals: Week 3:  PT Short Term Goal 1 (Week 3): pt will perfom sit<>stands with LRAD and CGA consistantly PT Short Term Goal 2 (Week 3): pt will ambulate 10f with LRAD and CGA PT Short Term Goal 3 (Week 3): pt will perform simulated car transfer with LRAD and min A  Skilled Therapeutic Interventions/Progress Updates:     Pt misses x30 minutes of skilled PT due to RN care. Upon PT follow up[facility]. Reports pain in R hip. Number not provided. Pt provides rest breaks as needed to manage pain, as well as mobility. WC transport to gym for time management. Pt performs sit to stand with RW and cues for hand placement. Pt ambulates x88' with RW and cues to decrease WB through RW for energy conservation, as well as increasing step height on R due to pt consistently dragging R toes during swing phase. Pt takes extended seated rest prior to additional mobility. Following, pt ambulates additional 444 with RW and CGA, with same cues and encouragement to motivate pt. Wc transport back to room. Left seated with all needs within reach.  Therapy Documentation Precautions:  Precautions Precautions: Fall, Other (comment) Precaution Comments: monitor SpO2 to maintain >92% (up to 2L of O2 via nasal cannula as needed), hx of seizure disorder, no R LE ROM restrictions Restrictions Weight Bearing Restrictions: Yes RLE Weight Bearing: Weight bearing as tolerated   Therapy/Group: Individual Therapy  WBreck Coons PT, DPT 09/23/2022, 5:32 PM

## 2022-09-23 NOTE — Progress Notes (Signed)
PROGRESS NOTE   Subjective/Complaints: Still has some hip pain Feels she has not been drinking enough water Says she is developing a hemorrhoid, has been straining, does not feel it is severe enough to warrant magnesium citrate.   ROS:   Pt denies SOB, abd pain,Headache, CP, and vision changes, +hip pain- improved, +bilateral lower extremity swelling- overall improved, +urinary frequency, +one time phlegm production, +constipation  Objective:   No results found. Recent Labs    09/23/22 0405  WBC 3.6*  HGB 7.9*  HCT 25.0*  PLT 175    Recent Labs    09/23/22 0405  NA 142  K 4.8  CL 105  CO2 29  GLUCOSE 95  BUN 44*  CREATININE 1.72*  CALCIUM 8.1*     Intake/Output Summary (Last 24 hours) at 09/23/2022 1218 Last data filed at 09/23/2022 8299 Gross per 24 hour  Intake 472 ml  Output --  Net 472 ml        Physical Exam: Vital Signs Blood pressure (!) 126/57, pulse 81, temperature 98.5 F (36.9 C), temperature source Oral, resp. rate 16, weight 76.1 kg, SpO2 92 %. General: awake, alert, appropriate, NAD BMI 30.68; In gym working with PT HENT: [place], AT , conjugate gaze; oropharynx moist CV: IIR, HR 68, no JVD Pulmonary: CTA B/L; no W/R/R- good air movement, breathing comfortably on room air GI: soft, NT, ND, (+)BS Psychiatric: appropriate- interactive, pleasant Neurological: Ox3  No clubbing, cyanosis. 2+ b/l LE edema. Pulses are 2+ Psych: Pleasant and cheerful most of the time. Pt is cooperative Neuro:  Alert and oriented x4,follows commands, CN 2-12 grossly intact Skin: Clean and intact without signs of breakdown R hip with 2 border foam dressings- CDI Sensation decreased to LT in b/l LE in stocking glove distribution, normal insight and judgement Strength 5/5 in b/l UE Strength 5/5 in LLE Strength 1-2/5 hip flexion and knee extension, 4/5 ankle PF and DF  FTN intact b/l Musculoskeletal:  Normal bulk  and tone.  R hip appropriately tender, ecchymosis along R thigh Bilateral lower extremity 1-2 edema TTP low back Sit to stand CG GU: total A for pericare, continent  Assessment/Plan: 1. Functional deficits which require 3+ hours per day of interdisciplinary therapy in a comprehensive inpatient rehab setting. Physiatrist is providing close team supervision and 24 hour management of active medical problems listed below. Physiatrist and rehab team continue to assess barriers to discharge/monitor patient progress toward functional and medical goals  Care Tool:  Bathing    Body parts bathed by patient: Right arm, Left arm, Chest, Abdomen, Face, Front perineal area, Buttocks, Right upper leg, Left upper leg, Right lower leg, Left lower leg   Body parts bathed by helper: Front perineal area, Buttocks, Right upper leg, Left upper leg, Right lower leg, Left lower leg     Bathing assist Assist Level: Contact Guard/Touching assist     Upper Body Dressing/Undressing Upper body dressing   What is the patient wearing?: Pull over shirt    Upper body assist Assist Level: Set up assist    Lower Body Dressing/Undressing Lower body dressing      What is the patient wearing?: Pants  Lower body assist Assist for lower body dressing: Supervision/Verbal cueing     Toileting Toileting    Toileting assist Assist for toileting: Contact Guard/Touching assist     Transfers Chair/bed transfer  Transfers assist     Chair/bed transfer assist level: Supervision/Verbal cueing Chair/bed transfer assistive device: Programmer, multimedia   Ambulation assist      Assist level: Contact Guard/Touching assist Assistive device: Walker-rolling Max distance: 23f   Walk 10 feet activity   Assist  Walk 10 feet activity did not occur: Safety/medical concerns  Assist level: Contact Guard/Touching assist Assistive device: Walker-rolling   Walk 50 feet activity   Assist Walk  50 feet with 2 turns activity did not occur: Safety/medical concerns  Assist level: Contact Guard/Touching assist Assistive device: Walker-rolling    Walk 150 feet activity   Assist Walk 150 feet activity did not occur: Safety/medical concerns         Walk 10 feet on uneven surface  activity   Assist Walk 10 feet on uneven surfaces activity did not occur: Safety/medical concerns         Wheelchair     Assist Is the patient using a wheelchair?: Yes Type of Wheelchair: Manual    Wheelchair assist level: Supervision/Verbal cueing Max wheelchair distance: 1550f   Wheelchair 50 feet with 2 turns activity    Assist        Assist Level: Supervision/Verbal cueing   Wheelchair 150 feet activity     Assist      Assist Level: Supervision/Verbal cueing   Blood pressure (!) 126/57, pulse 81, temperature 98.5 F (36.9 C), temperature source Oral, resp. rate 16, weight 76.1 kg, SpO2 92 %.  Medical Problem List and Plan: 1. Functional deficits secondary to right intertrochanteric hip fracture after mechanical fall.  Status post intramedullary nailing 08/30/2022.  Weightbearing as tolerated, no ROM restrictions              -patient may shower, soap and water only             -ELOS/Goals: 14 days             -Sutures removed 09/12/22               Continue CIR- PT and OT  -Ambulating farther with PT (reports about 60 feet earlier) 2.  Atrial fibrillation: continue Eliquis, toprol XL restarted, will decrease to 12.'5mg'$  HS             -antiplatelet therapy: Plavix 75 mg daily             - RLE duplex ordered 9/30 for pain, edema post-op - negative 3. Pain Management: continue Robaxin and oxycodone as needed. Increase Robaxin frequency to q4H prn. Ordered ice to hip three times per day for 15 minutes each. Increase scheduled tylenol to 1,'0000mg'$  TID- changed start time to 5am. Schedule oxycodone '10mg'$  daily at 7:30am prior to therapy. Start vitamin C supplement to  help with pain sensitivity. Provided with pain relief journal 4. Anxiety: continue Ativan 0.5 mg every 6 hours as needed anxiety.  Provide emotional support             -antipsychotic agents: N/A 5. Neuropsych/cognition: This patient is capable of making decisions on her own behalf. 6. Skin/Wound Care: Routine skin checks 7. Fluids/Electrolytes/Nutrition: Routine in and outs with follow-up chemistries 8.  Acute blood loss anemia. Hgb down to 7.6, monitor weekly 9.  AKI on CKD stage III.  Baseline creatinine  1.5-1.9.  Latest follow-up creatinine 1.80.  Follow-up renal services as needed.  Renal ultrasound negative. Currently at baseline, weight stable, will decrease torsemide back to '20mg'$  daily. Monitor labs weekly.  -10/16 Scr stable at 1.81, BUN down to 36 10.  Diabetes mellitus.  Sliding scale insulin. Check blood sugars before meals and at bedtime. Monitor closely as PO intake improves. Recommended avoiding added sugars and minimizing bread, pasta, and rice in her diet. Decrease CBG checks to AC and d/c HS sliding scale.Increase Semglee to 7U. Jardiance d/ced due to UTI Recent Labs    09/22/22 2108 09/23/22 0619 09/23/22 1151  GLUCAP 81 90 186*   Glucose overall well controlled  11.  History of seizure disorder.  continue Keppra 500 mg twice daily 12.Myelogenous leukemia.  In remission.  Follow-up outpatient with Dr. [name] 13.  Persistent atrial fibrillation. Discussed with cardiology holding low dose eliquis until XR to assess for hematoma.  Cardiac rate controlled 14.  CAD with stenting.  continue Plavix.  No chest pain or shortness of breath 15.  Chronic diastolic congestive heart failure.  Echocardiogram 04/17/2022 ejection fraction 60 to 65%.  LOSARTAN and JARDIANCE currently on hold.   Resume as needed. Increase torsemide back to home dose of '40mg'$  daily. Discussed with patient. Weights improved, swelling has improved, discussed with patient Filed Weights   09/19/22 0605 09/20/22  0500 09/22/22 0507  Weight: 77.4 kg 78.8 kg 76.1 kg     16.  History of uterine breast cancer.  Follow-up outpatient. 17.  Hyperlipidemia.  Lipitor '40mg'$  daily 18.  Constipation.  Senokot twice daily, miralax daily. Molasses enema on 10/9 ordered.   -10/16 LBM yesterday- having regular BMS, red streaks noted, continue to monitor  -placed nursing order requesting that they bring her 6 bottle waters daily as she does not feel she is drinking enough and prefers bottled water 19.  COPD with remote history of tobacco use.  Duoneb PRN. Check oxygen saturations every shift. Goal sat is 88-92%, adjusted order 20.  Acute urinary retention.  continue Flomax.   21. Indigestion             -Maalox prn 22. Low Vitamin D             -continue Ergocalciferol 50,000 units every 7 days for 7 doses. 23. Pancreas head mass. follows with Dr. Ardis Hughs 24. Obesity BMI 31.73-->32.09: provided dietary eduction. Increase torsemide given increase in weight.  25. 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  26. HTN: toprol decreased back to 12.'5mg'$  given low pressures, improved control, fluctuates, continue to monitor TID 27. UTI: d/c Jardiance, start keflex, UC shows >100,000 E coli and Klebsiella-Both sensitive to cefazolin 28. Hemorrhoid: non-painful, discussed this occurs due to straining form constipation, see#18     LOS: 18 days A FACE TO FACE EVALUATION WAS PERFORMED  Martha Clan P Shamela Haydon 09/23/2022, 12:18 PM

## 2022-09-23 NOTE — Progress Notes (Signed)
Physical Therapy Session Note  Patient Details  Name: [name] MRN: [id number] Date of Birth: Apr 15, 1941  Today's Date: 09/23/2022 PT Individual Time: 1016-1043 PT Individual Time Calculation (min): 27 min   Short Term Goals: Week 3:  PT Short Term Goal 1 (Week 3): pt will perfom sit<>stands with LRAD and CGA consistantly PT Short Term Goal 2 (Week 3): pt will ambulate 67f with LRAD and CGA PT Short Term Goal 3 (Week 3): pt will perform simulated car transfer with LRAD and min A  Skilled Therapeutic Interventions/Progress Updates:      Therapy Documentation Precautions:  Precautions Precautions: Fall, Other (comment) Precaution Comments: monitor SpO2 to maintain >92% (up to 2L of O2 via nasal cannula as needed), hx of seizure disorder, no R LE ROM restrictions Restrictions Weight Bearing Restrictions: Yes RLE Weight Bearing: Weight bearing as tolerated  Pt received seated in w/c at bedside, agreeable to PT session and declines pain. Pt transported by w/c to main gym for time management and energy conservation. Pt negotiated 8 steps SBA with 2 HR's and requires increased time due to activity tolerance deficits. Pt SPO2 93 following activity on RA. Pt transitioned to blocked practice of 5 sit to stand and required 1.52 seconds to complete. Pt required >12 seconds and is therefore at increased risk for falls. Pt transported to room and left seated in w/c at bedside with chair alarm on and all needs within reach.    Therapy/Group: Individual Therapy  SVerl DickerSVerl DickerPT, DPT  09/23/2022, 7:52 AM

## 2022-09-23 NOTE — Progress Notes (Signed)
Physical Therapy Session Note  Patient Details  Name: [name] MRN: [id number] Date of Birth: Mar 30, 1941  Today's Date: 09/23/2022 PT Individual Time: 0240-9735 PT Individual Time Calculation (min): 45 min   Short Term Goals: Week 3:  PT Short Term Goal 1 (Week 3): pt will perfom sit<>stands with LRAD and CGA consistantly PT Short Term Goal 2 (Week 3): pt will ambulate 56f with LRAD and CGA PT Short Term Goal 3 (Week 3): pt will perform simulated car transfer with LRAD and min A  Skilled Therapeutic Interventions/Progress Updates:     [facility] and supervisor in theroom upon PT arrival. Patient alert and agreeable to PT session. Patient reported 6/10 R hip pain and soreness during session, patient pre-medicated just prior to session. PT provided repositioning, rest breaks, and distraction as pain interventions throughout session.   Patient reports disorientation with time this morning due to the clock stopping. Changed clock batteries and set to correct time during patient's session. Focused session on functional mobility and lower extremity exercises this morning.   Therapeutic Activity: Transfers: Patient performed sit to/from stand [facility] from the w/c with CGA using RW. Provided verbal cues for pushing up from arm rests, patient able to teach back scooting forward and forward weight shift. Patient was continent of bowel and bladder during toileting (charted by nursing student in flowsheets). Performed peri-care, donned B thigh high TED hose, pants, clean brief, and B tennis shoes with total A due to patient needing B support on RW for standing balance.   Patient brushed her hair with set up assist w/c level in front of the mirror per patient request. Discussed AD for donning compressions stockings at home during.  Therapeutic Exercise: Patient performed the following exercises with verbal and tactile cues for proper technique. -seated  marching 2x10 -B LAQ 2x10 -B seated clam shells with 5 sec hold 2x10  Patient in w/c in the room at end of session with breaks locked, seat belt alarm set, and all needs within reach.   Therapy Documentation Precautions:  Precautions Precautions: Fall, Other (comment) Precaution Comments: monitor SpO2 to maintain >92% (up to 2L of O2 via nasal cannula as needed), hx of seizure disorder, no R LE ROM restrictions Restrictions Weight Bearing Restrictions: Yes RLE Weight Bearing: Weight bearing as tolerated    Therapy/Group: Individual Therapy  Raquelle Pietro L Kirstie Larsen PT, DPT, NCS, CBIS  09/23/2022, 7:07 PM

## 2022-09-23 NOTE — Progress Notes (Signed)
Occupational Therapy Session Note  Patient Details  Name: [name] MRN: [id number] Date of Birth: July 24, 1941  Today's Date: 09/23/2022 OT Individual Time: 1103-1200 OT Individual Time Calculation (min): 57 min    Short Term Goals: Week 2:  OT Short Term Goal 1 (Week 2): Patient to perform bathing with Mod A and AE as needed OT Short Term Goal 2 (Week 2): Patient to perform bathing with Mod A and AE as needed OT Short Term Goal 3 (Week 2): Pt will demonstrate increased AROM of RLE /R foot clearance during toilet transfers with no physical assist needed for RLE management  Skilled Therapeutic Interventions/Progress Updates:  Skilled OT intervention completed with focus on AROM/strengthening of RLE in prep for functional transfers and toileting needs. Pt received seated in w/c, no initial c/o pain, however reported unrated soreness upon mobility in R hip. Pt was pre-medicated, with therapist offering rest breaks and repositioning throughout for pain reduction.  Seated at sink pt completed facial hygiene with set up A. Assisted pt with CGA sit > stand pushing up with BUE on arm rests, then CGA stand pivot to BBSC. Able to doff pants with min A, then continent of void only. CGA sit > stand and peri-care in stance. Min A needed for donning pants from ankle level over hips. CGA stand pivot to w/c.  Transported dependently in w/c <> gym for energy conservation and pain relief. CGA sit > stand and stand pivot using RW from w/c <> nustep. Completed the following intervals: Level 1, 7 mins Level 3, 7 mins 247 steps total Mild SOB, however SPO2 WNL and pt able to sustain physical activity with no rest breaks using pursed lip breathing  Back in room pt was agreeable to sit in recliner for edema management and to prevent stiffness in RLE in prep for PT session. Transferred via [place] 6 ft ambulatory transfer from w/c > recliner using RW, heating pad applied to R hip for pain reduction and BLE  elevated. Pt remained seated in recliner with all needs met at end of session.  Therapy Documentation Precautions:  Precautions Precautions: Fall, Other (comment) Precaution Comments: monitor SpO2 to maintain >92% (up to 2L of O2 via nasal cannula as needed), hx of seizure disorder, no R LE ROM restrictions Restrictions Weight Bearing Restrictions: Yes RLE Weight Bearing: Weight bearing as tolerated    Therapy/Group: Individual Therapy  Blase Mess, MS, OTR/L  09/23/2022, 12:05 PM

## 2022-09-23 NOTE — Telephone Encounter (Signed)
Valsartan last filled by Nahser, Wonda Cheng, MD

## 2022-09-24 LAB — GLUCOSE, CAPILLARY
Glucose-Capillary: 168 mg/dL — ABNORMAL HIGH (ref 70–99)
Glucose-Capillary: 93 mg/dL (ref 70–99)
Glucose-Capillary: 108 mg/dL — ABNORMAL HIGH (ref 70–99)
Glucose-Capillary: 191 mg/dL — ABNORMAL HIGH (ref 70–99)

## 2022-09-24 MED ORDER — INSULIN ASPART 100 UNIT/ML IJ SOLN
0.0000 [IU] | Freq: Three times a day (TID) | INTRAMUSCULAR | Status: DC
  Administered 2022-09-25 (×2): 1 [IU] via SUBCUTANEOUS
  Administered 2022-09-26: 2 [IU] via SUBCUTANEOUS
  Administered 2022-09-26 – 2022-09-27 (×2): 1 [IU] via SUBCUTANEOUS
  Administered 2022-09-27: 2 [IU] via SUBCUTANEOUS
  Administered 2022-09-28 (×2): 1 [IU] via SUBCUTANEOUS
  Administered 2022-09-29: 2 [IU] via SUBCUTANEOUS

## 2022-09-24 MED ORDER — OXYCODONE HCL 5 MG PO TABS: 2.5000 mg | ORAL_TABLET | ORAL | Status: DC | PRN

## 2022-09-24 MED ORDER — INSULIN GLARGINE-YFGN 100 UNIT/ML ~~LOC~~ SOLN
8.0000 [IU] | Freq: Every day | SUBCUTANEOUS | Status: DC
  Administered 2022-09-25 – 2022-09-29 (×5): 8 [IU] via SUBCUTANEOUS
  Filled 2022-09-24 (×5): qty 0.08

## 2022-09-24 MED ORDER — OXYCODONE HCL 5 MG PO TABS
5.0000 mg | ORAL_TABLET | Freq: Once | ORAL | Status: AC
  Administered 2022-09-24: 5 mg via ORAL
  Filled 2022-09-24: qty 1

## 2022-09-24 NOTE — Progress Notes (Signed)
Occupational Therapy Session Note  Patient Details  Name: [name] MRN: [id number] Date of Birth: 02-05-1941  Today's Date: 09/24/2022 OT Individual Time: 0805-0900 OT Individual Time Calculation (min): 55 min    Short Term Goals: Week 3:  OT Short Term Goal 1 (Week 3): STG = LTG due to ELOS  Skilled Therapeutic Interventions/Progress Updates:  Pt greeted supine in bed, pt agreeable to OT intervention. Pt asking for knee high TEDS, retrieved TEDs and donned with total A from long sitting in bed. Session focus on BADL reeducation, functional mobility, dynamic standing balance and decreasing overall caregiver burden.      Pt on RA upon arrival, Spo2 86% at rest, donned 1.5L with Spo2 returning to 95%. Pt completed supine>sit with CGA with heavy use of bed features, pt reports not having hospital bed at home. Pt donned pants from EOB with CGA via sit>stand, pt abel to stand with CGA with Rw.   Pt completed stand pivot transfer from w/c>toilet with grab bar with CGA, pt needed [place] for 3/3 toileting tasks needing assist for clothing mgmt, + urine void. Pt completed seated hand hygiene at sink with supervision.  Pt transported pt >day room in wc with total A . Worked on functional sit>stands using coneect 4 game with pt completing sit>stand with Rw everytime it was pts turn to participate in game. Pt needed [place] for sit>stands with heavy emphasis on BUEs.  Pt on RA during task with SpO2 91%. Pt transported back to room in wc with total A with   pt left up in w/c with alarm belt activated and all needs within reach.                     Therapy Documentation Precautions:  Precautions Precautions: Fall, Other (comment) Precaution Comments: monitor SpO2 to maintain >92% (up to 2L of O2 via nasal cannula as needed), hx of seizure disorder, no R LE ROM restrictions Restrictions Weight Bearing Restrictions: Yes RLE Weight Bearing: Weight bearing as tolerated   Pain: Unrated pain reported in  R hip, rest breaks and repositioning provided as needed.    Therapy/Group: Individual Therapy  Corinne Ports [facility] 09/24/2022, 12:09 PM

## 2022-09-24 NOTE — Progress Notes (Signed)
Patient ID: [name], female   DOB: 01-13-1941, 81 y.o.   MRN: [id number]  Team Conference Report to Patient/Family  Team Conference discussion was reviewed with the patient and caregiver, including goals, any changes in plan of care and target discharge date.  Patient and caregiver express understanding and are in agreement.  The patient has a target discharge date of 10/01/22.  Sw met with patient and provided team conference updates. Patient does not believe that she is ready for discharge on the 25th, sw will discuss with team. Patient requesting 02 for discharge home. SW will add to patient [facility] DME order.  Dyanne Iha 09/24/2022, 2:00 PM

## 2022-09-24 NOTE — Progress Notes (Signed)
Occupational Therapy Weekly Progress Note  Patient Details  Name: [name] MRN: [id number] Date of Birth: June 27, 1941  Beginning of progress report period: September 15, 2022 End of progress report period: September 24, 2022   Patient has met 3 of 3 short term goals.  Pt is making steady progress towards LTGs. Pt is now at a CGA ambulatory transfer level using standard RW without platforms (required bilateral platforms in last note), is able to bathe at an overall min A level, dress at an overall CGA level with AE and increased time and requires min assist for toileting tasks. Pt continues to be weaned off of supplemental O2 to room air, however still requires cues for pursed lip breathing with SOB with increased mobility and bending during ADL management. Pain has continued to be a factor in her R hip, however pt has significantly improved her independence in her pain med regimen, which has allowed pt to tolerate a higher intensity of therapies. Pt's husband is physically disabled and pt will need to be at a confident supervision level due to limited assist available therefore would benefit from continued OT intervention to maximize her independence and safety upon discharge. Pt would benefit from a caregiver/friend attending education to discuss level of function and safety considerations prior to d/c.   Patient continues to demonstrate the following deficits: muscle weakness, decreased cardiorespiratoy endurance and decreased oxygen support, decreased coordination, and decreased standing balance and therefore will continue to benefit from skilled OT intervention to enhance overall performance with BADL, iADL, and Reduce care partner burden.  Patient progressing toward long term goals..  Continue plan of care.  OT Short Term Goals Week 1:  OT Short Term Goal 1 (Week 1): Patient to perform transfer to [facility] with Mod A using LRAD OT Short Term Goal 1 - Progress (Week 1): Met OT Short Term Goal 2  (Week 1): Patient to perform LE dressing with AE as needed OT Short Term Goal 2 - Progress (Week 1): Met OT Short Term Goal 3 (Week 1): Patient to perform bathing with Mod A and AE as needed OT Short Term Goal 3 - Progress (Week 1): Not met OT Short Term Goal 4 (Week 1): Patient to perform shower transfer with Mod A and LRAD OT Short Term Goal 4 - Progress (Week 1): Not met Week 2:  OT Short Term Goal 1 (Week 2): Patient to perform bathing with Mod A and AE as needed OT Short Term Goal 1 - Progress (Week 2): Met OT Short Term Goal 2 (Week 2): Patient to perform bathing with Mod A and AE as needed OT Short Term Goal 2 - Progress (Week 2): Met OT Short Term Goal 3 (Week 2): Pt will demonstrate increased AROM of RLE /R foot clearance during toilet transfers with no physical assist needed for RLE management OT Short Term Goal 3 - Progress (Week 2): Met Week 3:  OT Short Term Goal 1 (Week 3): STG = LTG due to [place], MS, OTR/L  09/24/2022, 7:47 AM

## 2022-09-24 NOTE — Progress Notes (Signed)
Physical Therapy Session Note  Patient Details  Name: [name] MRN: [id number] Date of Birth: 11/18/1941  Today's Date: 09/24/2022 PT Individual Time: 0945-1100 PT Individual Time Calculation (min): 75 min   Short Term Goals: Week 2:  PT Short Term Goal 1 (Week 2): pt will transfer sit<>supine with min A consistantly PT Short Term Goal 1 - Progress (Week 2): Progressing toward goal PT Short Term Goal 2 (Week 2): Pt will perform sit<>stands using LRAD with min assist PT Short Term Goal 2 - Progress (Week 2): Met PT Short Term Goal 3 (Week 2): Pt will ambulate at least 9ft using LRAD with min assist PT Short Term Goal 3 - Progress (Week 2): Met Week 3:  PT Short Term Goal 1 (Week 3): pt will perfom sit<>stands with LRAD and CGA consistantly PT Short Term Goal 2 (Week 3): pt will ambulate 57ft with LRAD and CGA PT Short Term Goal 3 (Week 3): pt will perform simulated car transfer with LRAD and min A  Skilled Therapeutic Interventions/Progress Updates:   Received pt sitting in WC, pt agreeable to PT treatment, and reported pain 8/10 in RLE when standing (premedicated). Pt also reported feeling extremely sore today from doing so much yesterday. Session with emphasis on functional mobility/transfers, toileting, generalized strengthening and endurance, dynamic standing balance/coordination, gait training, and simulated car transfers. Pt reported urge to void and transferred to<>from toilet with bedside commode overtop using grab bars and heavy min/mod A. Pt required mod A for clothing management and able to void and perform peri-care without assist. Pt performed hand hygiene seated in [facility] then replaced current long knee high ted hose with short thigh high ones as knee high ones creating tourniquet effect. Pt performed WC mobility 1104ft using BUE and supervision to fatigue - emphasis on cardiovascular endurance and UE strengthening. Pt transported to ortho gym in [facility] dependently and performed  simulated car transfer with RW and min A overall. Pt required assist to get RLE in/out of car and needed extended rest break during and after activity due to SOB - SPO2 91% and pt requesting breathing treatment at end of session. Pt then ambulated 57ft with RW and close supervision - limited by RLE pain and fatigue. Due to pain/fatigue, pt performed the following exercises with emphasis on LE strength/ROM: -LAQ 2x12 bilaterally with 1lb ankle weight on LLE -hip flexion 2x12 bilaterally - noted decreased ROM R>L -hip adduction ball squeezes 2x10 -trunk rotations with 4.4lb medicine ball 2x10 MD arrived briefly, then pt transported back to room dependently. Concluded session with pt sitting in WC, needs within reach, and seatbelt alarm on. RN notified pt request for breathing treatment.   Therapy Documentation Precautions:  Precautions Precautions: Fall, Other (comment) Precaution Comments: monitor SpO2 to maintain >92% (up to 2L of O2 via nasal cannula as needed), hx of seizure disorder, no R LE ROM restrictions Restrictions Weight Bearing Restrictions: Yes RLE Weight Bearing: Weight bearing as tolerated  Therapy/Group: Individual Therapy Alfonse Alpers PT, DPT 09/24/2022, 6:58 AM

## 2022-09-24 NOTE — Plan of Care (Signed)
  Problem: Consults Goal: RH GENERAL PATIENT EDUCATION Description: See Patient Education module for education specifics. Outcome: Progressing   Problem: RH SAFETY Goal: RH STG ADHERE TO SAFETY PRECAUTIONS W/ASSISTANCE/DEVICE Description: STG Adhere to Safety Precautions With cues Assistance/Device. Outcome: Progressing   Problem: RH PAIN MANAGEMENT Goal: RH STG PAIN MANAGED AT OR BELOW PT'S PAIN GOAL Description: < 4 with prns Outcome: Progressing   Problem: Clinical Measurements: Goal: Postoperative complications will be avoided or minimized Outcome: Progressing   Problem: Pain Management: Goal: Pain level will decrease Outcome: Progressing   Problem: Skin Integrity: Goal: Risk for impaired skin integrity will decrease Outcome: Progressing

## 2022-09-24 NOTE — Evaluation (Signed)
Recreational Therapy Assessment and Plan  Patient Details  Name: DARNESHA DILORETO MRN: [id number] Date of Birth: December 24, 1940 Today's Date: 09/24/2022  Rehab Potential:  Good ELOS:   d/c 10/25  Assessment   Hospital Problem: Principal Problem:   Intertrochanteric fracture of right hip [facility])     Past Medical History:      Past Medical History:  Diagnosis Date   Acute myelogenous leukemia ([place]) 02/2014   Acute pancreatitis     Arthritis      "back" (09/17/2017)   Atrial flutter ([place])     Cardiomyopathy ([place])     Chronic kidney disease, stage II (mild)     Chronic lower back pain     Colon polyps 04/29/2010    TUBULAR ADENOMA AND A SERRATED ADENOMA   COPD (chronic obstructive pulmonary disease) ([place])     CORONARY ARTERY DISEASE 12/24/2007   DIABETES MELLITUS, TYPE II 07/13/2007   History of blood transfusion 2015    "related to leukemia"   History of kidney stones 12/24/2007   History of uterine cancer     HYPERLIPIDEMIA 12/24/2007   HYPERTENSION 07/13/2007   NSTEMI (non-ST elevated myocardial infarction) ([place]) 09/17/2017   Uterine cancer ([place])      Past Surgical History:       Past Surgical History:  Procedure Laterality Date   ABDOMINAL HYSTERECTOMY        "still have my ovaries"   BREAST BIOPSY Left 09/22/2019   BREAST LUMPECTOMY Left 10/24/2019   BREAST LUMPECTOMY WITH RADIOACTIVE SEED LOCALIZATION Left 10/24/2019    Procedure: LEFT BREAST LUMPECTOMY WITH RADIOACTIVE SEED LOCALIZATION;  Surgeon: Jovita Kussmaul, MD;  Location: [place];  Service: General;  Laterality: Left;   CARDIAC CATHETERIZATION   2002   CARDIAC CATHETERIZATION   09/17/2017   CATARACT EXTRACTION W/ INTRAOCULAR LENS  IMPLANT, BILATERAL Bilateral     COLONOSCOPY W/ BIOPSIES AND POLYPECTOMY   2011   CORONARY STENT INTERVENTION N/A 09/21/2017    Procedure: CORONARY STENT INTERVENTION;  Surgeon: Troy Sine, MD;  Location: [place] CV LAB;  Service: Cardiovascular;   Laterality: N/A;   DILATION AND CURETTAGE OF UTERUS       ESOPHAGOGASTRODUODENOSCOPY N/A 01/30/2022    Procedure: ESOPHAGOGASTRODUODENOSCOPY (EGD);  Surgeon: Milus Banister, MD;  Location: [facility];  Service: Endoscopy;  Laterality: N/A;   EUS N/A 01/30/2022    Procedure: UPPER ENDOSCOPIC ULTRASOUND (EUS) RADIAL;  Surgeon: Milus Banister, MD;  Location: [facility];  Service: Endoscopy;  Laterality: N/A;   EXCISIONAL HEMORRHOIDECTOMY   X 2   FINE NEEDLE ASPIRATION N/A 01/30/2022    Procedure: FINE NEEDLE ASPIRATION (FNA) LINEAR;  Surgeon: Milus Banister, MD;  Location: [facility];  Service: Endoscopy;  Laterality: N/A;   GANGLION CYST EXCISION Left X 3   INTRAMEDULLARY (IM) NAIL INTERTROCHANTERIC Right 08/30/2022    Procedure: INTRAMEDULLARY (IM) NAIL INTERTROCHANTERIC;  Surgeon: Altamese Riceboro, MD;  Location: [place];  Service: Orthopedics;  Laterality: Right;   INTRAVASCULAR PRESSURE WIRE/FFR STUDY N/A 09/17/2017    Procedure: INTRAVASCULAR PRESSURE WIRE/FFR STUDY;  Surgeon: Nelva Bush, MD;  Location: [place] CV LAB;  Service: Cardiovascular;  Laterality: N/A;   NASAL SINUS SURGERY       PORTA CATH INSERTION Right 2013   RIGHT/LEFT HEART CATH AND CORONARY ANGIOGRAPHY N/A 09/17/2017    Procedure: RIGHT/LEFT HEART CATH AND CORONARY ANGIOGRAPHY;  Surgeon: Nelva Bush, MD;  Location: [place] CV LAB;  Service: Cardiovascular;  Laterality: N/A;   TUBAL  LIGATION          Assessment & Plan Clinical Impression: Patient is a 81 y.o. right-handed female with history of myelogenous leukemia in remission, atrial fibrillation on Eliquis, chronic back pain, seizure disorder maintained on Keppra, COPD, quit smoking 21 years ago, CAD with stenting maintained on Plavix, diabetes mellitus, hypertension, chronic diastolic congestive heart failure, uterine/breast cancer, CKD stage III.  Per chart review patient lives with spouse.  She has a 1 level home one-step to entry.  Independent  prior to admission and driving.  She still works Public librarian.  Presented 08/29/2022 after sustaining ground-level fall while at work. She reports that she tripped on a rug. No loss of consciousness.  Cranial CT scan negative for acute changes.  X-rays and imaging revealed right intertrochanteric hip fracture.  Underwent intramedullary nailing of right hip 08/30/2022 per Dr. Marcelino Scot.  Weightbearing as tolerated right lower extremity.  She continues on chronic Eliquis as prior to admission as well as her Plavix.  Acute blood loss anemia 9.5 and monitored.  Renal services for history of AKI on CKD creatinine baseline 2.41 on discharge peaking at 3.8.  She did initially receive gentle IV fluids.  Renal ultrasound with no hydronephrosis and latest creatinine 1.80.  IV lasix were given on 9/28 and 9/29 due to volume overload. She has had bouts of urinary retention and Foley tube was placed 9/25 and she was placed on Flomax.  Therapy evaluations completed due to patient decreased functional mobility was admitted for a comprehensive rehab program. Patient transferred to CIR on 09/05/2022.    Pt presents with decreased activity tolerance, decreased functional mobility, decreased balance, feelings of stress Limiting pt's independence with leisure/community pursuits.  Met with pt today to discuss TR services including leisure education, activity analysis/modifications and stress management.  Also discussed the importance of social, emotional, spiritual health in addition to physical health and their effects on overall health and wellness.  Pt stated understanding.  Plan  Min 1 TR session during LOS  Recommendations for other services: None   Discharge Criteria: Patient will be discharged from TR if patient refuses treatment 3 consecutive times without medical reason.  If treatment goals not met, if there is a change in medical status, if patient makes no progress towards goals or if patient is discharged from  hospital.  The above assessment, treatment plan, treatment alternatives and goals were discussed and mutually agreed upon: by patient  Napoleon 09/24/2022, 11:46 AM

## 2022-09-24 NOTE — Patient Care Conference (Signed)
Inpatient RehabilitationTeam Conference and Plan of Care Update Date: 09/24/2022   Time: 1:46 PM    Patient Name: [name]      Medical Record Number: [id number]  Date of Birth: 04-20-1941 Sex: Female         Room/Bed: 4W09C/4W09C-01 Payor Info: Payor: [employer] COMP / Plan: [employer] / Product Type: *No Product type* /    Admit Date/Time:  09/05/2022  3:33 PM  Primary Diagnosis:  Intertrochanteric fracture of right hip [facility])  Hospital Problems: Principal Problem:   Intertrochanteric fracture of right hip [facility])    Expected Discharge Date: Expected Discharge Date: 10/01/22  Team Members Present: Physician leading conference: Dr. Leeroy Cha Social Worker Present: Erlene Quan, BSW Nurse Present: Dorien Chihuahua, RN PT Present: Becky Sax, PT OT Present: Jennefer Bravo, OT PPS Coordinator present : Gunnar Fusi, SLP     Current Status/Progress Goal Weekly Team Focus  Bowel/Bladder     Continent of bowel and bladder        Swallow/Nutrition/ Hydration             ADL's   Set up A UB bathing/dressing, CGA-min A LB bathing/dressing and toileting  Supervision to CGA, need to upgrade to all supervision due to husband's assist capabilities  endurance, ADL retraining, sit > stands in prep for ADLs, d/c planning   Mobility   supine<>sitting EOB supervision, sit<>supine mod A, transfers with RW CGA, gait 13f with RW CGA/supervision, WC mobility [id number]  upgraded to supervision  functional mobility/transfers, generalized strengthening and endurance, dynamic standing balance/coordination, pain management, gait training, and D/C planning   Communication             Safety/Cognition/ Behavioral Observations            Pain     PRNs for pain control   < 4 with prns   Assess pain and need for medications, effectiveness of meds  Skin     Foam to hip And buttocks   Incision healing, no skin breakdown   Assess skin q shift and keep skin  warm and dry    Discharge Planning:  Worker's comp discharging home, spouse unable to assist   Team Discussion: Patient continues to have right hip pain. UTI addressed and constipation addressed. Edema improved, MD adjusted medications. Wean from O2 limited by anxiety/patient comfort although sats holding above goals of 88% - 92% on RA. Patient on target to meet rehab goals: yes, patient able to complete sit - supine with nod assist but able to ambulate up to 8811with CGA and completed 8 steps. Needs set up for UB bathing and dressing and CGA for bowel body bathing and dressing, toileting. Goals for discharge set for supervision overall.  *See Care Plan and progress notes for long and short-term goals.   Revisions to Treatment Plan:  Upgraded OT goals for supervision.   Teaching Needs: Safety, medications, skin care, dietary modifications, transfers, toileting, etc.  Current Barriers to Discharge: Home enviroment access/layout, Lack of/limited family support, and Weight bearing restrictions  Possible Resolutions to Barriers: Family education DME: RW, TTB, W/C HH follow up services     Medical Summary Current Status: COPD, shortness of breath, bilateral lower extremit swelling, right hip pain, constipation  Barriers to Discharge: Medical stability;Wound care  Barriers to Discharge Comments: COPD, shortness of breath, bilateral lower extremit swelling, right hip pain, constipation Possible Resolutions to [employer]: Discussed that oxygen saturation goal is 88-92 percent, continue torsemide '40mg'$  daily,  continue to monitor right hip incision, added oxycodone 2.5 for mild pain   Continued Need for Acute Rehabilitation Level of Care: The patient requires daily medical management by a physician with specialized training in physical medicine and rehabilitation for the following reasons: Direction of a multidisciplinary physical rehabilitation program to maximize functional  independence : Yes Medical management of patient stability for increased activity during participation in an intensive rehabilitation regime.: Yes Analysis of laboratory values and/or radiology reports with any subsequent need for medication adjustment and/or medical intervention. : Yes   I attest that I was present, lead the team conference, and concur with the assessment and plan of the team.   Dorien Chihuahua B 09/24/2022, 1:46 PM

## 2022-09-24 NOTE — Progress Notes (Signed)
Occupational Therapy Session Note  Patient Details  Name: [name] MRN: [id number] Date of Birth: June 27, 1941  Today's Date: 09/24/2022 OT Individual Time: 0277-4128 OT Individual Time Calculation (min): 70 min    Short Term Goals: Week 2:  OT Short Term Goal 1 (Week 2): Patient to perform bathing with Mod A and AE as needed OT Short Term Goal 2 (Week 2): Patient to perform bathing with Mod A and AE as needed OT Short Term Goal 3 (Week 2): Pt will demonstrate increased AROM of RLE /R foot clearance during toilet transfers with no physical assist needed for RLE management  Skilled Therapeutic Interventions/Progress Updates:  Skilled OT intervention completed with focus on toileting needs, dynamic balance to promote independence with BADLs. Pt received seated in w/c, trying to plug in her phone by herself. Assist provided to plug in phone.   Assisted pt with toileting needs at the CGA sit > stand and stand pivot level using RW to [facility] over toilet. Able to manage doffing brief with supervision, continent of void only. Education provided on technique to use if brief falls to ankles to maximize independence including pulling brief up prior to standing or working on floor retrieval balance to prep for such a task. CGA sit > stand using grab bar and RW, supervision for toileting steps this session! Supervision stand pivot to w/c.  Hand and facial hygiene seated at sink with set up A as therapist provided education on prepping for d/c by not using supplemental O2 at night just for "discomfort." With time, pt was able to report that she feels anxious when SOB, however was very receptive to the facts of O2 saturation levels vs dyspnea. Discussed pt using a pulse ox at home to calm her anxiety regarding "not being able to breathe" as well as for safety.   Transported pt dependently in w/c <> gym for time management. Completed CGA sit > stand and stand pivot from w/c <> EOM using RW.   Blocked  practice x5 sit > stands from initial 20" decreasing to 18.5" per height of w/c with supervision, with pt pushing up with both hands from mat then to RW. Pt indicated her bed at home is 24", therefore anticipate pt can easily manage her bed height.  In stance with supervision to CGA, with one UE on RW for balance, pt placed/removed squigz pieces on high-low mirror, with pt able to place squigz at the bottom of mirror on several accounts with great balance demonstrated. Discussed how this could relate to retrieving brief from ankles in stance if it falls during toileting.  Back in room, pt ambulated about 10 ft from w/c > EOB with CGA, then mod A for bed mobility. Therapist retrieved roho cushion for w/c to improve pressure relief techniques while seated in w/c for extended periods of time to minimize pressure wounds and pain in R hip. Primary PT notified of change made, with potential adjustments and fitting needed in next session. Pt remained upright in bed with bed alarm on and all needs in reach at end of session.   Therapy Documentation Precautions:  Precautions Precautions: Fall, Other (comment) Precaution Comments: monitor SpO2 to maintain >92% (up to 2L of O2 via nasal cannula as needed), hx of seizure disorder, no R LE ROM restrictions Restrictions Weight Bearing Restrictions: Yes RLE Weight Bearing: Weight bearing as tolerated    Therapy/Group: Individual Therapy  Blase Mess, MS, OTR/L  09/24/2022, 3:57 PM

## 2022-09-24 NOTE — Progress Notes (Signed)
PROGRESS NOTE   Subjective/Complaints: No new complaints this morning Her pain continues to be severe.  Swelling is much better Had 2 BM yesterday  ROS:   Pt denies SOB, abd pain,Headache, CP, and vision changes, +hip pain-continues to be severe, +bilateral lower extremity swelling- overall improved, +urinary frequency, +one time phlegm production, +constipation  Objective:   No results found. Recent Labs    09/23/22 0405  WBC 3.6*  HGB 7.9*  HCT 25.0*  PLT 175    Recent Labs    09/23/22 0405  NA 142  K 4.8  CL 105  CO2 29  GLUCOSE 95  BUN 44*  CREATININE 1.72*  CALCIUM 8.1*     Intake/Output Summary (Last 24 hours) at 09/24/2022 1005 Last data filed at 09/24/2022 0840 Gross per 24 hour  Intake 934 ml  Output --  Net 934 ml        Physical Exam: Vital Signs Blood pressure (!) 161/80, pulse 74, temperature 98.1 F (36.7 C), temperature source Oral, resp. rate 16, weight 79.3 kg, SpO2 96 %. General: awake, alert, appropriate, NAD BMI 30.68; In gym working with PT HENT: [place], AT , conjugate gaze; oropharynx moist CV: IIR, HR 68, no JVD Pulmonary: CTA B/L; no W/R/R- good air movement, breathing comfortably on room air GI: soft, NT, ND, (+)BS Psychiatric: appropriate- interactive, pleasant Neurological: Ox3  No clubbing, cyanosis. 2+ b/l LE edema. Pulses are 2+ Psych: Pleasant and cheerful most of the time. Pt is cooperative Neuro:  Alert and oriented x4,follows commands, CN 2-12 grossly intact Skin: Clean and intact without signs of breakdown R hip with 2 border foam dressings- CDI Sensation decreased to LT in b/l LE in stocking glove distribution, normal insight and judgement Strength 5/5 in b/l UE Strength 5/5 in LLE Strength 1-2/5 hip flexion and knee extension, 4/5 ankle PF and DF  FTN intact b/l Musculoskeletal:  Normal bulk and tone.  R hip appropriately tender, ecchymosis along R  thigh Bilateral lower extremity 1-2 edema Ambulating with RW CG TTP low back Sit to stand CG GU: total A for pericare, continent  Assessment/Plan: 1. Functional deficits which require 3+ hours per day of interdisciplinary therapy in a comprehensive inpatient rehab setting. Physiatrist is providing close team supervision and 24 hour management of active medical problems listed below. Physiatrist and rehab team continue to assess barriers to discharge/monitor patient progress toward functional and medical goals  Care Tool:  Bathing    Body parts bathed by patient: Right arm, Left arm, Chest, Abdomen, Face, Front perineal area, Buttocks, Right upper leg, Left upper leg, Right lower leg, Left lower leg   Body parts bathed by helper: Front perineal area, Buttocks, Right upper leg, Left upper leg, Right lower leg, Left lower leg     Bathing assist Assist Level: Contact Guard/Touching assist     Upper Body Dressing/Undressing Upper body dressing   What is the patient wearing?: Pull over shirt    Upper body assist Assist Level: Set up assist    Lower Body Dressing/Undressing Lower body dressing      What is the patient wearing?: Pants     Lower body assist Assist for lower body  dressing: Supervision/Verbal cueing     Toileting Toileting    Toileting assist Assist for toileting: Contact Guard/Touching assist     Transfers Chair/bed transfer  Transfers assist     Chair/bed transfer assist level: Supervision/Verbal cueing Chair/bed transfer assistive device: Programmer, multimedia   Ambulation assist      Assist level: Contact Guard/Touching assist Assistive device: Walker-rolling Max distance: 58f   Walk 10 feet activity   Assist  Walk 10 feet activity did not occur: Safety/medical concerns  Assist level: Contact Guard/Touching assist Assistive device: Walker-rolling   Walk 50 feet activity   Assist Walk 50 feet with 2 turns activity did  not occur: Safety/medical concerns  Assist level: Contact Guard/Touching assist Assistive device: Walker-rolling    Walk 150 feet activity   Assist Walk 150 feet activity did not occur: Safety/medical concerns         Walk 10 feet on uneven surface  activity   Assist Walk 10 feet on uneven surfaces activity did not occur: Safety/medical concerns         Wheelchair     Assist Is the patient using a wheelchair?: Yes Type of Wheelchair: Manual    Wheelchair assist level: Supervision/Verbal cueing Max wheelchair distance: 1562f   Wheelchair 50 feet with 2 turns activity    Assist        Assist Level: Supervision/Verbal cueing   Wheelchair 150 feet activity     Assist      Assist Level: Supervision/Verbal cueing   Blood pressure (!) 161/80, pulse 74, temperature 98.1 F (36.7 C), temperature source Oral, resp. rate 16, weight 79.3 kg, SpO2 96 %.  Medical Problem List and Plan: 1. Functional deficits secondary to right intertrochanteric hip fracture after mechanical fall.  Status post intramedullary nailing 08/30/2022.  Weightbearing as tolerated, no ROM restrictions              -patient may shower, soap and water only             -ELOS/Goals: 14 days             -Sutures removed 09/12/22               Continue CIR- PT and OT  Reviewed therapy notes 2.  Atrial fibrillation: continue Eliquis, toprol XL restarted, will decrease to 12.'5mg'$  HS             -antiplatelet therapy: Plavix 75 mg daily             - RLE duplex ordered 9/30 for pain, edema post-op - negative 3. Pain Management: continue Robaxin and oxycodone as needed. Increase Robaxin frequency to q4H prn. Ordered ice to hip three times per day for 15 minutes each. Increase scheduled tylenol to 1,'0000mg'$  TID- changed start time to 5am. Schedule oxycodone '10mg'$  daily at 7:30am prior to therapy. Start vitamin C supplement to help with pain sensitivity. Provided with pain relief journal. Added  oxycodone 2.'5mg'$  q4H prn for mild pain 4. Anxiety: continue Ativan 0.5 mg every 6 hours as needed anxiety.  Provide emotional support             -antipsychotic agents: N/A 5. Neuropsych/cognition: This patient is capable of making decisions on her own behalf. 6. Skin/Wound Care: Routine skin checks 7. Fluids/Electrolytes/Nutrition: Routine in and outs with follow-up chemistries 8.  Acute blood loss anemia. Hgb down to 7.6, monitor weekly 9.  AKI on CKD stage III.  Baseline creatinine 1.5-1.9.  Latest follow-up creatinine  1.80.  Follow-up renal services as needed.  Renal ultrasound negative. Currently at baseline, weight stable, will decrease torsemide back to '20mg'$  daily. Monitor labs weekly.  -10/16 Scr stable at 1.81, BUN down to 36 10.  Diabetes mellitus.  Sliding scale insulin. Check blood sugars before meals and at bedtime. Monitor closely as PO intake improves. Recommended avoiding added sugars and minimizing bread, pasta, and rice in her diet. Decrease CBG checks to AC and d/c HS sliding scale.Increase Semglee to 7U. Jardiance d/ced due to UTI Recent Labs    09/23/22 1151 09/23/22 1630 09/24/22 0817  GLUCAP 186* 91 168*   Glucose overall well controlled Decreased ISS to no more than 1U, increase Semglee to 8U  11.  History of seizure disorder.  continue Keppra 500 mg twice daily 12.Myelogenous leukemia.  In remission.  Follow-up outpatient with Dr. [name] 13.  Persistent atrial fibrillation. Discussed with cardiology holding low dose eliquis until XR to assess for hematoma.  Cardiac rate controlled 14.  CAD with stenting.  continue Plavix.  No chest pain or shortness of breath 15.  Chronic diastolic congestive heart failure.  Echocardiogram 04/17/2022 ejection fraction 60 to 65%.  LOSARTAN and JARDIANCE currently on hold.   Resume as needed. Increase torsemide back to home dose of '40mg'$  daily. Discussed with patient. Weights improved, swelling has improved, discussed with patient 10/18:  weight up today but swelling has improved, may be associated with increased insulin, continue to monitor.  Filed Weights   09/20/22 0500 09/22/22 0507 09/24/22 0704  Weight: 78.8 kg 76.1 kg 79.3 kg     16.  History of uterine breast cancer.  Follow-up outpatient. 17.  Hyperlipidemia.  Lipitor '40mg'$  daily 18.  Constipation.  Senokot twice daily, miralax daily. Molasses enema on 10/9 ordered.   -10/16 LBM yesterday- having regular BMS, red streaks noted, continue to monitor  -placed nursing order requesting that they bring her 6 bottle waters daily as she does not feel she is drinking enough and prefers bottled water 19.  COPD with remote history of tobacco use.  Duoneb PRN. Check oxygen saturations every shift. Goal sat is 88-92%, adjusted order 20.  Acute urinary retention.  continue Flomax.   21. Indigestion             -Maalox prn 22. Low Vitamin D             -continue Ergocalciferol 50,000 units every 7 days for 7 doses. 23. Pancreas head mass. follows with Dr. Ardis Hughs 24. Obesity BMI 31.73-->32.09: provided dietary eduction. Increase torsemide given increase in weight.  25. 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  26. HTN: toprol decreased back to 12.'5mg'$  given low pressures, improved control, fluctuates, continue to monitor TID 27. UTI: d/c Jardiance, start keflex, UC shows >100,000 E coli and Klebsiella-Both sensitive to cefazolin 28. Hemorrhoid: non-painful, discussed this occurs due to straining form constipation, see#18     LOS: 19 days A FACE TO FACE EVALUATION WAS PERFORMED  Twana Wileman P Shreyansh Tiffany 09/24/2022, 10:05 AM

## 2022-09-25 LAB — GLUCOSE, CAPILLARY
Glucose-Capillary: 141 mg/dL — ABNORMAL HIGH (ref 70–99)
Glucose-Capillary: 84 mg/dL (ref 70–99)
Glucose-Capillary: 160 mg/dL — ABNORMAL HIGH (ref 70–99)
Glucose-Capillary: 156 mg/dL — ABNORMAL HIGH (ref 70–99)

## 2022-09-25 MED ORDER — OXYCODONE HCL 5 MG PO TABS
5.0000 mg | ORAL_TABLET | Freq: Once | ORAL | Status: AC
  Administered 2022-09-25: 5 mg via ORAL
  Filled 2022-09-25: qty 1

## 2022-09-25 NOTE — Progress Notes (Signed)
PROGRESS NOTE   Subjective/Complaints: She does not want to to a nursing home, she prefers home Swelling has improved Had 2 BM today  ROS:   Pt denies SOB currently, but experiences at times, denies abd pain,Headache, CP, and vision changes, +hip pain-continues to be severe, +bilateral lower extremity swelling- overall improved, +urinary frequency, +one time phlegm production, +constipation  Objective:   No results found. Recent Labs    09/23/22 0405  WBC 3.6*  HGB 7.9*  HCT 25.0*  PLT 175    Recent Labs    09/23/22 0405  NA 142  K 4.8  CL 105  CO2 29  GLUCOSE 95  BUN 44*  CREATININE 1.72*  CALCIUM 8.1*     Intake/Output Summary (Last 24 hours) at 09/25/2022 0946 Last data filed at 09/24/2022 2100 Gross per 24 hour  Intake 477 ml  Output 100 ml  Net 377 ml        Physical Exam: Vital Signs Blood pressure (!) 92/56, pulse 66, temperature 98.1 F (36.7 C), temperature source Oral, resp. rate 18, weight 79.3 kg, SpO2 98 %. General: awake, alert, appropriate, NAD BMI 30.68; In gym working with PT HENT: [place], AT , conjugate gaze; oropharynx moist CV: IIR, HR 68, no JVD Pulmonary: CTA B/L; no W/R/R- good air movement, breathing comfortably on room air GI: soft, NT, ND, (+)BS Psychiatric: appropriate- interactive, pleasant Neurological: Ox3  No clubbing, cyanosis. 2+ b/l LE edema. Pulses are 2+ Psych: Pleasant and cheerful most of the time. Pt is cooperative Neuro:  Alert and oriented x4,follows commands, CN 2-12 grossly intact Skin: Clean and intact without signs of breakdown R hip with 2 border foam dressings- CDI Sensation decreased to LT in b/l LE in stocking glove distribution, normal insight and judgement Strength 5/5 in b/l UE Strength 5/5 in LLE Strength 1-2/5 hip flexion and knee extension, 4/5 ankle PF and DF  FTN intact b/l Musculoskeletal:  Normal bulk and tone.  R hip appropriately  tender, ecchymosis along R thigh Bilateral lower extremity 1-2 edema, compression garments in place Ambulating with RW CG TTP low back Sit to stand CG GU: total A for pericare, continent  Assessment/Plan: 1. Functional deficits which require 3+ hours per day of interdisciplinary therapy in a comprehensive inpatient rehab setting. Physiatrist is providing close team supervision and 24 hour management of active medical problems listed below. Physiatrist and rehab team continue to assess barriers to discharge/monitor patient progress toward functional and medical goals  Care Tool:  Bathing    Body parts bathed by patient: Right arm, Left arm, Chest, Abdomen, Face, Front perineal area, Buttocks, Right upper leg, Left upper leg, Right lower leg, Left lower leg   Body parts bathed by helper: Front perineal area, Buttocks, Right upper leg, Left upper leg, Right lower leg, Left lower leg     Bathing assist Assist Level: Contact Guard/Touching assist     Upper Body Dressing/Undressing Upper body dressing   What is the patient wearing?: Pull over shirt    Upper body assist Assist Level: Set up assist    Lower Body Dressing/Undressing Lower body dressing      What is the patient wearing?: Pants  Lower body assist Assist for lower body dressing: Supervision/Verbal cueing     Toileting Toileting    Toileting assist Assist for toileting: Supervision/Verbal cueing     Transfers Chair/bed transfer  Transfers assist     Chair/bed transfer assist level: Supervision/Verbal cueing Chair/bed transfer assistive device: Programmer, multimedia   Ambulation assist      Assist level: Supervision/Verbal cueing Assistive device: Walker-rolling Max distance: 26f   Walk 10 feet activity   Assist  Walk 10 feet activity did not occur: Safety/medical concerns  Assist level: Supervision/Verbal cueing Assistive device: Walker-rolling   Walk 50 feet  activity   Assist Walk 50 feet with 2 turns activity did not occur: Safety/medical concerns  Assist level: Contact Guard/Touching assist Assistive device: Walker-rolling    Walk 150 feet activity   Assist Walk 150 feet activity did not occur: Safety/medical concerns         Walk 10 feet on uneven surface  activity   Assist Walk 10 feet on uneven surfaces activity did not occur: Safety/medical concerns         Wheelchair     Assist Is the patient using a wheelchair?: Yes Type of Wheelchair: Manual    Wheelchair assist level: Supervision/Verbal cueing Max wheelchair distance: 158f   Wheelchair 50 feet with 2 turns activity    Assist        Assist Level: Supervision/Verbal cueing   Wheelchair 150 feet activity     Assist      Assist Level: Supervision/Verbal cueing   Blood pressure (!) 92/56, pulse 66, temperature 98.1 F (36.7 C), temperature source Oral, resp. rate 18, weight 79.3 kg, SpO2 98 %.  Medical Problem List and Plan: 1. Functional deficits secondary to right intertrochanteric hip fracture after mechanical fall.  Status post intramedullary nailing 08/30/2022.  Weightbearing as tolerated, no ROM restrictions              -patient may shower, soap and water only             -ELOS/Goals: 14 days             -Sutures removed 09/12/22               Continue CIR- PT and OT  Reviewed therapy notes  Discussed with patient her preference for home versus SNF 2.  Atrial fibrillation: continue Eliquis, toprol XL restarted, will decrease to 12.'5mg'$  HS             -antiplatelet therapy: Plavix 75 mg daily             - RLE duplex ordered 9/30 for pain, edema post-op - negative 3. Pain Management: continue Robaxin and oxycodone as needed. Increase Robaxin frequency to q4H prn. Ordered ice to hip three times per day for 15 minutes each. Increase scheduled tylenol to 1,'0000mg'$  TID- changed start time to 5am. Schedule oxycodone '10mg'$  daily at 7:30am  prior to therapy. Start vitamin C supplement to help with pain sensitivity. Provided with pain relief journal. Added oxycodone 2.'5mg'$  q4H prn for mild pain 4. Anxiety: continue Ativan 0.5 mg every 6 hours as needed anxiety.  Provide emotional support             -antipsychotic agents: N/A 5. Neuropsych/cognition: This patient is capable of making decisions on her own behalf. 6. Skin/Wound Care: Routine skin checks 7. Fluids/Electrolytes/Nutrition: Routine in and outs with follow-up chemistries 8.  Acute blood loss anemia. Hgb down to 7.6, monitor weekly 9.  AKI on CKD stage III.  Baseline creatinine 1.5-1.9.  Latest follow-up creatinine 1.80.  Follow-up renal services as needed.  Renal ultrasound negative. Currently at baseline, weight stable, will decrease torsemide back to '20mg'$  daily. Monitor labs weekly.  -10/16 Scr stable at 1.81, BUN down to 36 10.  Diabetes mellitus.  Sliding scale insulin. Check blood sugars before meals and at bedtime. Monitor closely as PO intake improves. Recommended avoiding added sugars and minimizing bread, pasta, and rice in her diet. Decrease CBG checks to AC and d/c HS sliding scale.Increase Semglee to 7U. Jardiance d/ced due to UTI Recent Labs    09/24/22 1715 09/24/22 2135 09/25/22 0651  GLUCAP 108* 191* 141*   Glucose overall well controlled Decreased ISS to no more than 1U, increase Semglee to 8U  11.  History of seizure disorder.  continue Keppra 500 mg twice daily 12.Myelogenous leukemia.  In remission.  Follow-up outpatient with Dr. [name] 13.  Persistent atrial fibrillation. Discussed with cardiology holding low dose eliquis until XR to assess for hematoma.  Cardiac rate controlled 14.  CAD with stenting.  continue Plavix.  No chest pain or shortness of breath 15.  Chronic diastolic congestive heart failure.  Echocardiogram 04/17/2022 ejection fraction 60 to 65%.  LOSARTAN and JARDIANCE currently on hold.   Resume as needed. Increase torsemide back to  home dose of '40mg'$  daily. Discussed with patient. Weights improved, swelling has improved, discussed with patient 10/18: weight up today but swelling has improved, may be associated with increased insulin, continue to monitor.  Filed Weights   09/22/22 0507 09/24/22 0704 09/25/22 0309  Weight: 76.1 kg 79.3 kg 79.3 kg     16.  History of uterine breast cancer.  Follow-up outpatient. 17.  Hyperlipidemia.  Lipitor '40mg'$  daily 18.  Constipation.  Senokot twice daily, miralax daily. Molasses enema on 10/9 ordered.   -10/16 LBM yesterday- having regular BMS, red streaks noted, continue to monitor  -placed nursing order requesting that they bring her 6 bottle waters daily as she does not feel she is drinking enough and prefers bottled water 19.  COPD with remote history of tobacco use.  Duoneb PRN- discussed that we can continue inhaler/nebulizer at home. Check oxygen saturations every shift. Goal sat is 88-92%, adjusted order 20.  Acute urinary retention.  improved, continue Flomax.   21. Indigestion             -Maalox prn 22. Low Vitamin D             -continue Ergocalciferol 50,000 units every 7 days for 7 doses. 23. Pancreas head mass. follows with Dr. Ardis Hughs 24. Obesity BMI 31.73-->32.09: provided dietary eduction. Increase torsemide given increase in weight.  25. 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  26. HTN: toprol decreased back to 12.'5mg'$  given low pressures, improved control, fluctuates, continue to monitor TID 27. UTI: d/c Jardiance, start keflex, UC shows >100,000 E coli and Klebsiella-Both sensitive to cefazolin 28. Hemorrhoid: non-painful, discussed this occurs due to straining form constipation, see#18     LOS: 20 days A FACE TO FACE EVALUATION WAS PERFORMED  Clide Deutscher Susy Placzek 09/25/2022, 9:46 AM

## 2022-09-25 NOTE — Group Note (Signed)
Patient Details Name: VIOLETTE MORNEAULT MRN: [id number] DOB: Jan 04, 1941 Today's Date: 09/25/2022 1105-12   Group Description: Stress management: Pt participated in group session with a focus on stress mgmt, education provided on healthy coping strategies, and social interaction. Focus of session on providing coping strategies to manage new diagnosis to allow for improved mental health to increase overall quality of life . Discussed how to break down stressors into "daily hassles," "major life stressors" and "life circumstances" in an effort to allow pts to chunk their stressors into groups and determine where to best put their efforts/time when dealing with stress. Provided active listening, emotional support and therapeutic use of self. Offered education on factors that protect [place] against stress such as "daily uplifts," "healthy coping strategies" and "protective factors." Encouraged all group members to make an effort to actively recall one event from their day that was a daily uplift in an effort to protect their mindset from stressors as well as sharing this information with their caregivers to facilitate improved caregiver communication and decrease overall burden of care.  Issued pt handouts on healthy coping strategies to implement into routine.   Individual level documentation: Patient participated with full collaboration during session.   Pain: no c/o Pain Assessment Pain Score: 4     Simonne Boulos 09/25/2022, 3:10 PM

## 2022-09-25 NOTE — Progress Notes (Signed)
Physical Therapy Session Note  Patient Details  Name: [name] MRN: [id number] Date of Birth: 01-24-1941  Today's Date: 09/25/2022 PT Individual Time: 0731-0826 PT Individual Time Calculation (min): 55 min   Short Term Goals: Week 2:  PT Short Term Goal 1 (Week 2): pt will transfer sit<>supine with min A consistantly PT Short Term Goal 1 - Progress (Week 2): Progressing toward goal PT Short Term Goal 2 (Week 2): Pt will perform sit<>stands using LRAD with min assist PT Short Term Goal 2 - Progress (Week 2): Met PT Short Term Goal 3 (Week 2): Pt will ambulate at least 63ft using LRAD with min assist PT Short Term Goal 3 - Progress (Week 2): Met Week 3:  PT Short Term Goal 1 (Week 3): pt will perfom sit<>stands with LRAD and CGA consistantly PT Short Term Goal 2 (Week 3): pt will ambulate 22ft with LRAD and CGA PT Short Term Goal 3 (Week 3): pt will perform simulated car transfer with LRAD and min A  Skilled Therapeutic Interventions/Progress Updates:   Received pt sitting in [facility] reporting urgent need to have BM - pt reported not calling for nursing and waiting for therapist to arrive to toilet. Encouraged pt not to hold bowels and call for assist and to maximize time for physical therapy related activities. Pt agreeable to PT treatment and reported pain 8/10 in R hip (premedicated) - RN arrived to administer medication. Session with emphasis on functional mobility/transfers, toileting, dressing, generalized strengthening and endurance, and gait training. Pt transferred to/from toilet with bedside commode over top using grab bar and CGA. Pt required min A for brief management and continent of bowel - able to perform peri-care seated with supervision. Pt performed hand hygiene and combed hair seated in WC at sink with mod I. Discussed bed at home and pt reports not wanting hospital bed, as her HOB adjusts - discussed getting bed assist rail due to pt's heavy reliance on rails in hospital.  Donned thigh high ted hose and shoes in sitting with max A for edema management and pt stood with RW and close supervision x 3 trials throughout session and required min A to pull pants over hips - SPO2 89% increasing to 90% with deep breathing. Pt with questions regarding breathing treatments at home; secure chatted MD. Pt then ambulated 30ft with RW and close supervision - limited by fatigue. Provided pt with walker bag and requested order for Roho cushion for pressure relieving purposes. Concluded session with pt sitting in WC, needs within reach, and seatbelt alarm on.   Therapy Documentation Precautions:  Precautions Precautions: Fall, Other (comment) Precaution Comments: monitor SpO2 to maintain >92% (up to 2L of O2 via nasal cannula as needed), hx of seizure disorder, no R LE ROM restrictions Restrictions Weight Bearing Restrictions: Yes RLE Weight Bearing: Weight bearing as tolerated  Therapy/Group: Individual Therapy Alfonse Alpers PT, DPT  09/25/2022, 6:51 AM

## 2022-09-25 NOTE — Progress Notes (Signed)
Physical Therapy Session Note  Patient Details  Name: [name] MRN: [id number] Date of Birth: 12-Jun-1941  Today's Date: 09/25/2022 PT Individual Time: 0900-0945 PT Individual Time Calculation (min): 45 min   Short Term Goals: Week 3:  PT Short Term Goal 1 (Week 3): pt will perfom sit<>stands with LRAD and CGA consistantly PT Short Term Goal 2 (Week 3): pt will ambulate 23f with LRAD and CGA PT Short Term Goal 3 (Week 3): pt will perform simulated car transfer with LRAD and min A  Skilled Therapeutic Interventions/Progress Updates: Pt presented in w/c agreeable to therapy. Pt states pain managed at the moment, rest breaks provided as needed. Pt requesting to use toilet for BM. Due to urgency pt transported to toilet and performed stand pivot with RW CGA. Pt was supervision for LB clothing management and peri-care with continent B&B void (noted in flowsheet). After completion pt ambulated ~841fto w/c with CGA overall. Pt then rested while MD performed daily assessment. Pt then ambulated ~4812fith RW and CGA with step through pattern but significantly decreased self selected gait speed. Pt transported back to room and performed x 5 Sit to stand for BLE strengthening. Pt left in w/c at end of session with belt alarm on, call bell within reach and needs met.      Therapy Documentation Precautions:  Precautions Precautions: Fall, Other (comment) Precaution Comments: monitor SpO2 to maintain >92% (up to 2L of O2 via nasal cannula as needed), hx of seizure disorder, no R LE ROM restrictions Restrictions Weight Bearing Restrictions: Yes RLE Weight Bearing: Weight bearing as tolerated General:   Vital Signs: Therapy Vitals Temp: 98.4 F (36.9 C) Temp Source: Oral Pulse Rate: 72 Resp: 18 BP: (!) 152/49 Patient Position (if appropriate): Sitting Oxygen Therapy SpO2: 96 % O2 Device: Room Air Pain: Pain Assessment Pain Score: 4  Mobility:   Locomotion :    Trunk/Postural  Assessment :    Balance:   Exercises:   Other Treatments:      Therapy/Group: Individual Therapy  Maeson Lourenco 09/25/2022, 4:09 PM

## 2022-09-25 NOTE — Progress Notes (Signed)
Physical Therapy Session Note  Patient Details  Name: [name] MRN: [id number] Date of Birth: [birth year]  Today's Date: 09/25/2022 PT Individual Time: 1345-1415 PT Individual Time Calculation (min): 30 min   Short Term Goals: Week 2:  PT Short Term Goal 1 (Week 2): pt will transfer sit<>supine with min A consistantly PT Short Term Goal 1 - Progress (Week 2): Progressing toward goal PT Short Term Goal 2 (Week 2): Pt will perform sit<>stands using LRAD with min assist PT Short Term Goal 2 - Progress (Week 2): Met PT Short Term Goal 3 (Week 2): Pt will ambulate at least 87f using LRAD with min assist PT Short Term Goal 3 - Progress (Week 2): Met  Skilled Therapeutic Interventions/Progress Updates:     Pt seated in w/c on arrival and agreeable to therapy. Pt reports 7/10 pain, premedicated. Rest and positioning provided as needed. Pt reports urge to use bathroom before leaving room. Pt performed Stand pivot transfer with grab bars. Supervision for 3/3 toileting tasks. Continent urine void. Pt then washed hands at w/c level. Pt transported to therapy gym for time management and energy conservation. Pt ambulated x 67 ft with RW and CGA, max encouragement. Pt with slow speed and antalgic gait pattern. Pt then ambulated another 40 ft after rest break. Pt returned to room and remained in w/c, was left with all needs in reach and alarm active.    Therapy Documentation Precautions:  Precautions Precautions: Fall, Other (comment) Precaution Comments: monitor SpO2 to maintain >92% (up to 2L of O2 via nasal cannula as needed), hx of seizure disorder, no R LE ROM restrictions Restrictions Weight Bearing Restrictions: Yes RLE Weight Bearing: Weight bearing as tolerated General:       Therapy/Group: Individual Therapy  OMickel Fuchs10/19/2023, 3:44 PM

## 2022-09-25 NOTE — Group Note (Signed)
Patient Details Name: [name] MRN: [id number] DOB: 08/12/1941 Today's Date: 09/25/2022  Time Calculation: OT Group Time Calculation OT Group Start Time: 1105 OT Group Stop Time: 7357 OT Group Time Calculation (min): 60 min      Group Description: Stress management: Pt participated in group session with a focus on stress mgmt, education provided on healthy coping strategies, and social interaction. Focus of session on providing coping strategies to manage new diagnosis to allow for improved mental health to increase overall quality of life . Discussed how to break down stressors into "daily hassles," "major life stressors" and "life circumstances" in an effort to allow pts to chunk their stressors into groups and determine where to best put their efforts/time when dealing with stress. Provided active listening, emotional support and therapeutic use of self. Offered education on factors that protect [place] against stress such as "daily uplifts," "healthy coping strategies" and "protective factors." Encouraged all group members to make an effort to actively recall one event from their day that was a daily uplift in an effort to protect their mindset from stressors as well as sharing this information with their caregivers to facilitate improved caregiver communication and decrease overall burden of care.  Issued pt handouts on healthy coping strategies to implement into routine.   Individual level documentation: Patient participated with fair collaboration during session.   Pain: No c/o      Precious Haws 09/25/2022, 3:57 PM

## 2022-09-25 NOTE — Progress Notes (Signed)
Patient ID: [name], female   DOB: August 02, 1941, 81 y.o.   MRN: [id number]  Clinicals sent to [facility] at [place]: [phone]

## 2022-09-25 NOTE — Progress Notes (Incomplete)
Recreational Therapy Session Note  Patient Details  Name: LILYMAE SWIECH MRN: [id number] Date of Birth: August 22, 1941 Today's Date: 09/25/2022  Pain: *** Skilled Therapeutic Interventions/Progress Updates: ***  Therapy/Group: {TR Therapy/Group:3049008}  Activity Level: {TR Activity Level:3049009} Level of assist: {TR Level of Assist:3049010}  Etta Gassett 09/25/2022, 2:17 PM

## 2022-09-25 NOTE — Progress Notes (Signed)
Occupational Therapy Session Note  Patient Details  Name: [name] MRN: [id number] Date of Birth: [birth year]  Today's Date: 09/25/2022 OT Individual Time: 1001-1044 OT Individual Time Calculation (min): 43 min    Short Term Goals: Week 3:  OT Short Term Goal 1 (Week 3): STG = LTG due to ELOS  Skilled Therapeutic Interventions/Progress Updates:  Skilled OT intervention completed with focus on kitchen management education, BUE exercises. Pt received seated in w/c, on RA, no c/o SOB and SPO2 WNL for duration of session. 6/10 pain reported in R hip, nurse in room to administer meds.   Transported pt <> ADL kitchen for time management. In kitchen, pt demonstrated ability to reach into various cabinet heights, open/close fridge door, all at the ambulatory supervision level (up to 20 ft total) with use of RW. Cues were needed for RW positioning when managing doors to prevent posterior LOB. Therapist retrieved walker tray for use with gathering items, with education provided about benefits, purpose and use of the device for increasing independence with IADLs at home. Discussed difference between walker bag and tray with its versatile needs. Notified CSW of need of tray.  Discussed potential SNF d/c if pt is not confident she can manage her CLOF at home with only supervision however pt is adamant about preferring home. Discussed goals to meet and target areas to feel confident in to avoid SNF discharge with pt remaining very motivated.  Back in room, pt completed the following exercises to promote strength needed for RW management during functional transfers- Bicep flexion, each arm, yellow theraband, x10 Horizontal shoulder abduction, yellow theraband, x10  Pt remained seated in w/c, with nursing student present at direct care handoff for assessing vitals, with all needs met at end of session.   Therapy Documentation Precautions:  Precautions Precautions: Fall, Other (comment) Precaution  Comments: monitor SpO2 to maintain >92% (up to 2L of O2 via nasal cannula as needed), hx of seizure disorder, no R LE ROM restrictions Restrictions Weight Bearing Restrictions: Yes RLE Weight Bearing: Weight bearing as tolerated    Therapy/Group: Individual Therapy  Blase Mess, MS, OTR/L  09/25/2022, 12:10 PM

## 2022-09-25 NOTE — Progress Notes (Signed)
Patient ID: [name], female   DOB: December 19, 1940, 81 y.o.   MRN: [id number]  Sw informed by physician patient will like to proceed with discharge plan home. Sw informed worker's comp CM to proceed with orders.   Stacey Rigsbee [email or web address] Office Telephone: [phone] Ext. [phone] Office Fax: [phone]  Work Cell: [phone]

## 2022-09-25 NOTE — Plan of Care (Signed)
  Problem: Sit to Stand Goal: LTG:  Patient will perform sit to stand in prep for activites of daily living with assistance level (OT) Description: LTG:  Patient will perform sit to stand in prep for activites of daily living with assistance level (OT) Flowsheets (Taken 09/25/2022 1526) LTG: PT will perform sit to stand in prep for activites of daily living with assistance level: (upgraded due to improved balance, activity tolerance and pain management) Supervision/Verbal cueing   Problem: RH Dressing Goal: LTG Patient will perform lower body dressing w/assist (OT) Description: LTG: Patient will perform lower body dressing with assist, with/without cues in positioning using equipment (OT) Flowsheets (Taken 09/25/2022 1526) LTG: Pt will perform lower body dressing with assistance level of: (upgraded due to improved balance, activity tolerance and pain management) Supervision/Verbal cueing   Problem: RH Toileting Goal: LTG Patient will perform toileting task (3/3 steps) with assistance level (OT) Description: LTG: Patient will perform toileting task (3/3 steps) with assistance level (OT)  Flowsheets (Taken 09/25/2022 1526) LTG: Pt will perform toileting task (3/3 steps) with assistance level: (upgraded due to improved balance, activity tolerance and pain management) Supervision/Verbal cueing   Problem: RH Toilet Transfers Goal: LTG Patient will perform toilet transfers w/assist (OT) Description: LTG: Patient will perform toilet transfers with assist, with/without cues using equipment (OT) Flowsheets (Taken 09/25/2022 1526) LTG: Pt will perform toilet transfers with assistance level of: (upgraded due to improved balance, activity tolerance and pain management) Supervision/Verbal cueing

## 2022-09-26 LAB — GLUCOSE, CAPILLARY
Glucose-Capillary: 118 mg/dL — ABNORMAL HIGH (ref 70–99)
Glucose-Capillary: 178 mg/dL — ABNORMAL HIGH (ref 70–99)
Glucose-Capillary: 133 mg/dL — ABNORMAL HIGH (ref 70–99)
Glucose-Capillary: 83 mg/dL (ref 70–99)

## 2022-09-26 NOTE — Progress Notes (Signed)
PROGRESS NOTE   Subjective/Complaints: No new complaints this morning She continues to have hip pain but is managing well with current oxycodone regimen  ROS:   Pt denies SOB currently, but experiences at times, denies abd pain,Headache, CP, and vision changes, +hip pain-continues to be severe, but tolerable with oxycodone, +bilateral lower extremity swelling- overall improved, +urinary frequency, +one time phlegm production, +constipation  Objective:   No results found. No results for input(s): "WBC", "HGB", "HCT", "PLT" in the last 72 hours.   No results for input(s): "NA", "K", "CL", "CO2", "GLUCOSE", "BUN", "CREATININE", "CALCIUM" in the last 72 hours.    Intake/Output Summary (Last 24 hours) at 09/26/2022 1112 Last data filed at 09/26/2022 0720 Gross per 24 hour  Intake 250 ml  Output --  Net 250 ml        Physical Exam: Vital Signs Blood pressure (!) 132/52, pulse 76, temperature 97.9 F (36.6 C), temperature source Oral, resp. rate 18, weight 78.7 kg, SpO2 (!) 86 %. General: awake, alert, appropriate, NAD BMI 30.68; In gym working with PT HENT: [place], AT , conjugate gaze; oropharynx moist CV: IIR, HR 68, no JVD Pulmonary: CTA B/L; no W/R/R- good air movement, breathing comfortably on room air GI: soft, NT, ND, (+)BS Psychiatric: appropriate- interactive, pleasant Neurological: Ox3  No clubbing, cyanosis. 2+ b/l LE edema. Pulses are 2+ Psych: Pleasant and cheerful most of the time. Pt is cooperative Neuro:  Alert and oriented x4,follows commands, CN 2-12 grossly intact Skin: Clean and intact without signs of breakdown R hip with 2 border foam dressings- CDI Sensation decreased to LT in b/l LE in stocking glove distribution, normal insight and judgement Strength 5/5 in b/l UE Strength 5/5 in LLE Strength 1-2/5 hip flexion and knee extension, 4/5 ankle PF and DF  FTN intact b/l Musculoskeletal:  Normal bulk  and tone.  R hip appropriately tender, ecchymosis along R thigh Bilateral lower extremity 1-2 edema, compression garments in place Ambulating with RW CG TTP low back Sit to stand CG Transferring with RW CG GU: total A for pericare, continent  Assessment/Plan: 1. Functional deficits which require 3+ hours per day of interdisciplinary therapy in a comprehensive inpatient rehab setting. Physiatrist is providing close team supervision and 24 hour management of active medical problems listed below. Physiatrist and rehab team continue to assess barriers to discharge/monitor patient progress toward functional and medical goals  Care Tool:  Bathing    Body parts bathed by patient: Right arm, Left arm, Chest, Abdomen, Face, Front perineal area, Buttocks, Right upper leg, Left upper leg, Right lower leg, Left lower leg   Body parts bathed by helper: Front perineal area, Buttocks, Right upper leg, Left upper leg, Right lower leg, Left lower leg     Bathing assist Assist Level: Contact Guard/Touching assist     Upper Body Dressing/Undressing Upper body dressing   What is the patient wearing?: Pull over shirt    Upper body assist Assist Level: Set up assist    Lower Body Dressing/Undressing Lower body dressing      What is the patient wearing?: Pants     Lower body assist Assist for lower body dressing: Supervision/Verbal cueing  Toileting Toileting    Toileting assist Assist for toileting: Supervision/Verbal cueing     Transfers Chair/bed transfer  Transfers assist     Chair/bed transfer assist level: Supervision/Verbal cueing Chair/bed transfer assistive device: Programmer, multimedia   Ambulation assist      Assist level: Supervision/Verbal cueing Assistive device: Walker-rolling Max distance: 72f   Walk 10 feet activity   Assist  Walk 10 feet activity did not occur: Safety/medical concerns  Assist level: Supervision/Verbal  cueing Assistive device: Walker-rolling   Walk 50 feet activity   Assist Walk 50 feet with 2 turns activity did not occur: Safety/medical concerns  Assist level: Supervision/Verbal cueing Assistive device: Walker-rolling    Walk 150 feet activity   Assist Walk 150 feet activity did not occur: Safety/medical concerns         Walk 10 feet on uneven surface  activity   Assist Walk 10 feet on uneven surfaces activity did not occur: Safety/medical concerns   Assist level: Supervision/Verbal cueing Assistive device: Walker-rolling   Wheelchair     Assist Is the patient using a wheelchair?: Yes Type of Wheelchair: Manual    Wheelchair assist level: Supervision/Verbal cueing Max wheelchair distance: 1550f   Wheelchair 50 feet with 2 turns activity    Assist        Assist Level: Supervision/Verbal cueing   Wheelchair 150 feet activity     Assist      Assist Level: Supervision/Verbal cueing   Blood pressure (!) 132/52, pulse 76, temperature 97.9 F (36.6 C), temperature source Oral, resp. rate 18, weight 78.7 kg, SpO2 (!) 86 %.  Medical Problem List and Plan: 1. Functional deficits secondary to right intertrochanteric hip fracture after mechanical fall.  Status post intramedullary nailing 08/30/2022.  Weightbearing as tolerated, no ROM restrictions              -patient may shower, soap and water only             -ELOS/Goals: 14 days             -Sutures removed 09/12/22          Continue CIR- PT and OT  Reviewed therapy notes  Discussed with patient her preference for home versus SNF 2.  Atrial fibrillation: continue Eliquis, toprol XL restarted, continue metoprolol to 12.'5mg'$  HS             -antiplatelet therapy: Plavix 75 mg daily             - RLE duplex ordered 9/30 for pain, edema post-op - negative 3. Pain Management: continue Robaxin and oxycodone as needed. Increase Robaxin frequency to q4H prn. Ordered ice to hip three times per day for 15  minutes each. Increase scheduled tylenol to 1,'0000mg'$  TID- changed start time to 5am. Schedule oxycodone '10mg'$  daily at 7:30am prior to therapy. Start vitamin C supplement to help with pain sensitivity. Provided with pain relief journal. Added oxycodone 2.'5mg'$  q4H prn for mild pain 4. Anxiety: continue Ativan 0.5 mg every 6 hours as needed anxiety.  Provide emotional support             -antipsychotic agents: N/A 5. Neuropsych/cognition: This patient is capable of making decisions on her own behalf. 6. Skin/Wound Care: Routine skin checks 7. Fluids/Electrolytes/Nutrition: Routine in and outs with follow-up chemistries 8.  Acute blood loss anemia. Hgb down to 7.6, monitor weekly 9.  AKI on CKD stage III.  Baseline creatinine 1.5-1.9.  Latest follow-up creatinine 1.80.  Follow-up renal services as needed.  Renal ultrasound negative. Currently at baseline, weight stable, will decrease torsemide back to '20mg'$  daily. Monitor labs weekly.  -10/16 Scr stable at 1.81, BUN down to 36 10.  Diabetes mellitus.  Sliding scale insulin. Check blood sugars before meals and at bedtime. Monitor closely as PO intake improves. Recommended avoiding added sugars and minimizing bread, pasta, and rice in her diet. Decrease CBG checks to AC and d/c HS sliding scale.Increase Semglee to 7U. Jardiance d/ced due to UTI Recent Labs    09/25/22 1657 09/25/22 2219 09/26/22 0603  GLUCAP 160* 156* 118*   Glucose overall well controlled Decreased ISS to no more than 1U, increase Semglee to 8U  11.  History of seizure disorder.  continue Keppra 500 mg twice daily 12.Myelogenous leukemia.  In remission.  Follow-up outpatient with Dr. [name] 13.  Persistent atrial fibrillation. Discussed with cardiology holding low dose eliquis until XR to assess for hematoma.  Cardiac rate controlled 14.  CAD with stenting.  continue Plavix.  No chest pain or shortness of breath 15.  Chronic diastolic congestive heart failure.  Echocardiogram  04/17/2022 ejection fraction 60 to 65%.  LOSARTAN and JARDIANCE currently on hold.   Resume as needed. Increase torsemide back to home dose of '40mg'$  daily. Discussed with patient. Weights improved, swelling has improved, discussed with patient 10/18: weight up today but swelling has improved, may be associated with increased insulin, continue to monitor.  Filed Weights   09/24/22 0704 09/25/22 0309 09/26/22 0340  Weight: 79.3 kg 79.3 kg 78.7 kg     16.  History of uterine breast cancer.  Follow-up outpatient. 17.  Hyperlipidemia.  Lipitor '40mg'$  daily 18.  Constipation.  Senokot twice daily, miralax daily. Molasses enema on 10/9 ordered.   -10/16 LBM yesterday- having regular BMS, red streaks noted, continue to monitor  -placed nursing order requesting that they bring her 6 bottle waters daily as she does not feel she is drinking enough and prefers bottled water 19.  COPD with remote history of tobacco use.  Duoneb PRN- discussed that we can continue inhaler/nebulizer at home. Check oxygen saturations every shift. Goal sat is 88-92%, adjusted order. Discussed that she will not need O2 at home.  20.  Acute urinary retention.  improved, continue Flomax.   21. Indigestion             -Maalox prn 22. Low Vitamin D             -continue Ergocalciferol 50,000 units every 7 days for 7 doses. 23. Pancreas head mass. follows with Dr. Ardis Hughs 24. Obesity BMI 31.73-->32.09: provided dietary eduction. Increase torsemide given increase in weight.  25. 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  26. HTN: toprol decreased back to 12.'5mg'$  given low pressures, improved control, fluctuates, continue to monitor TID 27. UTI: d/c Jardiance, start keflex, UC shows >100,000 E coli and Klebsiella-Both sensitive to cefazolin 28. Hemorrhoid: non-painful, discussed this occurs due to straining form constipation, see#18     LOS: 21 days A FACE TO FACE EVALUATION WAS PERFORMED  Garlen Reinig P Wilbur [place] 09/26/2022, 11:12 AM

## 2022-09-26 NOTE — Progress Notes (Signed)
Physical Therapy Weekly Progress Note  Patient Details  Name: [name] MRN: [id number] Date of Birth: 1941-10-29  Beginning of progress report period: September 06, 2022 End of progress report period: September 26, 2022  Patient has met 3 of 3 short term goals. Pt demonstrates gradual progress towards long term goals. Pt is currently able to transfer supine<>sitting EOB with supervision and use of bed features, but requires mod A for sit<>supine for LE management. Pt is able to perform transfers with RW and CGA/close supervision and ambulate up to 43ft with RW and CGA/close supervision. Pt continues to be limited by pain in RLE, generalized weakness/deconditioning, decreased balance, and SOB with activity (however has been weaned to RA).   Patient continues to demonstrate the following deficits muscle weakness and muscle joint tightness, decreased cardiorespiratoy endurance and decreased oxygen support, and decreased standing balance, decreased postural control, and decreased balance strategies and therefore will continue to benefit from skilled PT intervention to increase functional independence with mobility.  Patient progressing toward long term goals..  Continue plan of care.  PT Short Term Goals Week 3:  PT Short Term Goal 1 (Week 3): pt will perfom sit<>stands with LRAD and CGA consistantly PT Short Term Goal 1 - Progress (Week 3): Met PT Short Term Goal 2 (Week 3): pt will ambulate 80ft with LRAD and CGA PT Short Term Goal 2 - Progress (Week 3): Met PT Short Term Goal 3 (Week 3): pt will perform simulated car transfer with LRAD and min A PT Short Term Goal 3 - Progress (Week 3): Met Week 4:  PT Short Term Goal 1 (Week 4): STG=LTG due to LOS  Skilled Therapeutic Interventions/Progress Updates:  Ambulation/gait training;Community reintegration;DME/adaptive equipment instruction;Neuromuscular re-education;Psychosocial support;Stair training;UE/LE Strength taining/ROM;Wheelchair  propulsion/positioning;Balance/vestibular training;Discharge planning;Functional electrical stimulation;Pain management;Skin care/wound management;Therapeutic Activities;UE/LE Coordination activities;Disease management/prevention;Functional mobility training;Patient/family education;Splinting/orthotics;Therapeutic Exercise;Visual/perceptual remediation/compensation   Therapy Documentation Precautions:  Precautions Precautions: Fall, Other (comment) Precaution Comments: monitor SpO2 to maintain >92% (up to 2L of O2 via nasal cannula as needed), hx of seizure disorder, no R LE ROM restrictions Restrictions Weight Bearing Restrictions: Yes RLE Weight Bearing: Weight bearing as tolerated  Therapy/Group: Individual Therapy Alfonse Alpers PT, DPT  09/26/2022, 7:06 AM

## 2022-09-26 NOTE — Progress Notes (Signed)
Physical Therapy Session Note  Patient Details  Name: [name] MRN: [id number] Date of Birth: 23-Aug-1941  Today's Date: 09/26/2022 PT Individual Time: [phone] and [id number] PT Individual Time Calculation (min): 70 min and 70 min  Short Term Goals: Week 3:  PT Short Term Goal 1 (Week 3): pt will perfom sit<>stands with LRAD and CGA consistantly PT Short Term Goal 1 - Progress (Week 3): Met PT Short Term Goal 2 (Week 3): pt will ambulate 66f with LRAD and CGA PT Short Term Goal 2 - Progress (Week 3): Met PT Short Term Goal 3 (Week 3): pt will perform simulated car transfer with LRAD and min A PT Short Term Goal 3 - Progress (Week 3): Met Week 4:  PT Short Term Goal 1 (Week 4): STG=LTG due to LOS  Skilled Therapeutic Interventions/Progress Updates:  Ambulation/gait training;Community reintegration;DME/adaptive equipment instruction;Neuromuscular re-education;Psychosocial support;Stair training;UE/LE Strength taining/ROM;Wheelchair propulsion/positioning;Balance/vestibular training;Discharge planning;Functional electrical stimulation;Pain management;Skin care/wound management;Therapeutic Activities;UE/LE Coordination activities;Disease management/prevention;Functional mobility training;Patient/family education;Splinting/orthotics;Therapeutic Exercise;Visual/perceptual remediation/compensation   Treatment Session 1 Received pt sitting in WC, pt agreeable to PT treatment, and denied any pain at rest (premedicated). Session with emphasis on dressing, functional mobility/transfers, generalized strengthening and endurance, dynamic standing balance, simulated car transfers, and gait training. MD arrived for morning rounds and donned thigh high ted hose and pants with max A and stood with RW and close supervision to pull pants over hips. Pt sat in [place] and washed face with set up assist. Pt requesting breathing treatment but agreeable to wait until after PT session.   Pt performed WC  mobility 1528fusing BUE and supervision - emphasis on UE strength and cardiovascular endurance. Pt transported remainder of way [facility] supervision and performed simulated car transfer with min A for RLE management - SPO2 89% increasing to 93% with pursed lip breathing. Pt then ambulated 103fn uneven surfaces (ramp) with RW and supervision. Pt transported to dayroom and stood with RW and supervision and ambulated [id number] RW and close supervision to fatigue - SPO2 90% increasing to 93% with seated rest break. Pt educated on donning/doffing legrests and able to do so with supervision and min cues. Pt [facility] back [facility]. Concluded session with pt sitting in WC, needs within reach, and seatbelt alarm on. Provided pt with ice pack to R hip.   Treatment Session 2 Received pt sitting in WC, pt agreeable to PT treatment, and reported pain 7/10 in RLE (premedicated) - repositioning, rest breaks, and distraction done to reduce pain levels. Session with emphasis on functional mobility/transfers, toileting, generalized strengthening and endurance, bed mobility, dynamic standing balance, and ambulation. Pt reported urge to void and stood with RW and supervision and ambulated in/out of bathroom with RW and close supervision. Pt able to remove clothes with close supervision and continent of bladder. Pt performed peri-care with supervision and stood from toilet with supervision using grab bar and pulled brief/pants over hips with CGA. Ambulated to bed to work on bed mobility and noted audible wheezing - SPO2 94%; wheezing improved with time but RN notified of need for breathing treatment. Attempted to work on bed mobility from 24in high bed using gait belt as leg lifter, but pt reported feeling anxious trying to get strap around her foot and feeling a panic attack coming on; ultimately requesting to practice this after getting breathing treatment.  Discussed getting bed assist rail due to heavy reliance on  rails for assist and CSW placed order for one. Pt transferred bed<>WC stand<>pivot with RW and supervision and transported to/from room in [facility] dependently for energy conservation purposes. Pt performed the following exercises with supervision and verbal cues for technique with emphasis on LE strength/ROM: -LAQ 2x15 bilaterally -hip flexion 2x15 bilaterally  -heel/toe raises 2x20 -hamstring curls with grn TB 2x15 -hip abduction with grn TB 2x10 Pt then performed seated BLE strengthening on Kinetron at 20 cm/sec for 1 minute x 4 trials with emphasis on glute/quad strength. Returned to room and concluded session with pt sitting in WC, needs within reach, and seatbelt alarm on. RN aware of pt's request for breathing treatment.    Therapy Documentation Precautions:  Precautions Precautions: Fall, Other (comment) Precaution Comments: monitor SpO2 to maintain >92% (up to 2L of O2 via nasal cannula as needed), hx of seizure disorder, no R LE ROM restrictions Restrictions Weight Bearing Restrictions: Yes RLE Weight Bearing: Weight bearing as tolerated  Therapy/Group: Individual Therapy Alfonse Alpers PT, DPT  09/26/2022, 7:07 AM

## 2022-09-26 NOTE — Progress Notes (Signed)
Recreational Therapy Session Note  Patient Details  Name: [name] MRN: [id number] Date of Birth: [birth year] Today's Date: 09/26/2022  Pain: no c/o pain, pt had ice pack on hip during session Skilled Therapeutic Interventions/Progress Updates: Session focused on discharge planning in regards to home management (meals), mobility, community pursuits, support system.  Pt is anxious to return home and states she feel prepared for discharge.  Pt with questions about follow up appointments and therapies.    Lyndon 09/26/2022, 8:50 AM

## 2022-09-26 NOTE — Progress Notes (Signed)
Occupational Therapy Session Note  Patient Details  Name: [name] MRN: [id number] Date of Birth: [birth year]  Today's Date: 09/26/2022 OT Individual Time: 1105-1200 OT Individual Time Calculation (min): 55 min    Short Term Goals: Week 3:  OT Short Term Goal 1 (Week 3): STG = LTG due to ELOS  Skilled Therapeutic Interventions/Progress Updates:  Skilled OT intervention completed with focus on toileting needs/education, BUE exercises. Pt received seated in w/c, no initial c/o pain at rest, however 6/10 pain in R hip with mobility, pre-medicated. Therapist offered repositioning and rest breaks for pain reduction. On RA, with SPO2 WNL, mild SOB during mobility however did not require supplemental O2.  Assisted pt with toileting needs, with supervision for sit > stand using grab bars and stand pivot with min A HHA, doffed pants with supervision for balance. Continent of void only. In prep for d/c, therapist educated pt on use of brief with fasteners for ease with toileting at home vs pull ups. With cues for technique, pt was able to thread her own brief, sit > stand using grab bar with supervision. Therapist donned barrier cream to buttocks per request for itching, and sacral pad for comfort per request. Donned brief/pants over hips with supervision, CGA stand pivot to w/c.   Seated at sink pt completed facial and hand hygiene with mod I. Therapist encouraged mobility however pt indicated too much pain and wanting to rest after PT due to walking 90+ feet!   Seated in w/c, pt completed the following exercises: (With yellow theraband) 10 reps Horizontal abduction Self-anchored shoulder flexion each arm Self-anchored bicep flexion each arm Alternating chest presses Shoulder external rotation Shoulder extension Shoulder diagonal pulls Therapist anchored scapular retraction each arm  Pt remained seated in w/c, with all needs in reach at end of session.   Therapy  Documentation Precautions:  Precautions Precautions: Fall, Other (comment) Precaution Comments: monitor SpO2 to maintain >92% (up to 2L of O2 via nasal cannula as needed), hx of seizure disorder, no R LE ROM restrictions Restrictions Weight Bearing Restrictions: Yes RLE Weight Bearing: Weight bearing as tolerated    Therapy/Group: Individual Therapy  Blase Mess, MS, OTR/L  09/26/2022, 12:07 PM

## 2022-09-26 NOTE — Progress Notes (Signed)
Patient ID: [name], female   DOB: July 05, 1941, 81 y.o.   MRN: [id number]  SW informed patient will required additional DME. SW will submit to [facility].  Bed rail, walker tray and 20x16 roho cushion.

## 2022-09-27 LAB — GLUCOSE, CAPILLARY
Glucose-Capillary: 103 mg/dL — ABNORMAL HIGH (ref 70–99)
Glucose-Capillary: 185 mg/dL — ABNORMAL HIGH (ref 70–99)
Glucose-Capillary: 130 mg/dL — ABNORMAL HIGH (ref 70–99)
Glucose-Capillary: 176 mg/dL — ABNORMAL HIGH (ref 70–99)

## 2022-09-27 MED ORDER — HEPARIN SOD (PORK) LOCK FLUSH 100 UNIT/ML IV SOLN
500.0000 [IU] | INTRAVENOUS | Status: AC | PRN
  Administered 2022-09-27: 500 [IU]

## 2022-09-27 NOTE — Progress Notes (Signed)
Physical Therapy Session Note  Patient Details  Name: [name] MRN: [id number] Date of Birth: [birth year]  Today's Date: 09/27/2022 PT Individual Time: 1400-1442 PT Individual Time Calculation (min): 42 min   Short Term Goals: Week 1:  PT Short Term Goal 1 (Week 1): Pt will perform supine<>sit with min assist consistently PT Short Term Goal 1 - Progress (Week 1): Met PT Short Term Goal 2 (Week 1): Pt will perform sit<>stands using LRAD with min assist PT Short Term Goal 2 - Progress (Week 1): Progressing toward goal PT Short Term Goal 3 (Week 1): Pt will perform bed<>chair transfers using LRAD with min assist PT Short Term Goal 3 - Progress (Week 1): Met PT Short Term Goal 4 (Week 1): Pt will ambulate at least 66f using LRAD with min assist (+2 for safety as needed) PT Short Term Goal 4 - Progress (Week 1): Progressing toward goal  Skilled Therapeutic Interventions/Progress Updates:  Pt was seen bedside in the pm. Pt propelled w/c about 100 feet with B UEs and S. Pt performed multiple transfers with S and rolling walker. Pt ambulated 100 feet with rolling walker and S with verbal cues. Pt performed 1 set x 5 reps and 2 sets x 10 reps step taps for LE strengthening. Pt returned to room after treatment. Pt ambulated about 5 feet with rolling walker and S. Pt transferred edge of bed to supine with min to mod A and verbal cues. Pt left sitting up in bed with bed alarm on and all needs within reach.   Therapy Documentation Precautions:  Precautions Precautions: Fall, Other (comment) Precaution Comments: monitor SpO2 to maintain >92% (up to 2L of O2 via nasal cannula as needed), hx of seizure disorder, no R LE ROM restrictions Restrictions Weight Bearing Restrictions: Yes RLE Weight Bearing: Weight bearing as tolerated General:   Pain: Pt c/o soreness R LE    Therapy/Group: Individual Therapy  MDub Amis10/21/2023, 3:33 PM

## 2022-09-27 NOTE — Progress Notes (Signed)
Occupational Therapy Session Note  Patient Details  Name: [name] MRN: [id number] Date of Birth: 05/03/1941  Today's Date: 09/27/2022 OT Individual Time: 0920-1015 OT Individual Time Calculation (min): 55 min    Short Term Goals: Week 1:  OT Short Term Goal 1 (Week 1): Patient to perform transfer to [facility] with Mod A using LRAD OT Short Term Goal 1 - Progress (Week 1): Met OT Short Term Goal 2 (Week 1): Patient to perform LE dressing with AE as needed OT Short Term Goal 2 - Progress (Week 1): Met OT Short Term Goal 3 (Week 1): Patient to perform bathing with Mod A and AE as needed OT Short Term Goal 3 - Progress (Week 1): Not met OT Short Term Goal 4 (Week 1): Patient to perform shower transfer with Mod A and LRAD OT Short Term Goal 4 - Progress (Week 1): Not met  Skilled Therapeutic Interventions/Progress Updates:    Mrs Brannum, was resting comfortable in bed upon arrival , she indicated that she was a little tired from therapy.  The pt was in a agreement with completing a BADL related task in bathing and dressing.  The pt had no report of pain this treatment session.  The pt presents with a resting 02 saturation of 94%. The pt was MaxA for donning ted hose, she was  able to transfer to EOB from supine with SBA. The pt was able to transfer from EOB to w/c LOF with CGA.  The pt was able to roll to the bathroom door with close S. I then cleared the threshold and the pt was able to approach the grab bar for coming from sit to stand with CGA, she was able to doff her brief and pants with MinA for clearing her feet.  The pt was able to wipe and wash her bottom and perineal area with s/u assist while incorporating the grab bar. The pt required ModA for donning her brief and SBA for her pants. The pt returned to the w/c with initial cues and close S while incorporating the grab bar and w/c handle.  The pt was rolled to the sink area where she was able to brush her teeth, rinse her mouth and comb her  hair with s/u assist. The pt presented with a 02 saturation of 95% with activities of daily living.  The pt completed the OT treatment session performing UB exercises for shld flexion, horizontal abduction, and shld rotation 2 sets of 10 with rest breaks as needed, the pt required 1 rest break between sets.. The pt was instructed in relaxation breathing to improve compliance during task performance and for greater return in relation to her 02 saturation.  The pt remained at w/c LOF with pillows positioned under BLE. Her call light and bedside table were within reach and her chair alarm was activated.    Therapy Documentation Precautions:  Precautions Precautions: Fall, Other (comment) Precaution Comments: monitor SpO2 to maintain >92% (up to 2L of O2 via nasal cannula as needed), hx of seizure disorder, no R LE ROM restrictions Restrictions Weight Bearing Restrictions: Yes RLE Weight Bearing: Weight bearing as tolerated   Therapy/Group: Individual Therapy  Yvonne Kendall 09/27/2022, 12:07 PM

## 2022-09-27 NOTE — Progress Notes (Signed)
PROGRESS NOTE   Subjective/Complaints:  Pt reports a little sore this AM, but pain pretty well controlled at rest.  No issues LBM this AM    ROS:    Pt denies SOB, abd pain, CP, N/V/C/D, and vision changes   Objective:   No results found. No results for input(s): "WBC", "HGB", "HCT", "PLT" in the last 72 hours.   No results for input(s): "NA", "K", "CL", "CO2", "GLUCOSE", "BUN", "CREATININE", "CALCIUM" in the last 72 hours.    Intake/Output Summary (Last 24 hours) at 09/27/2022 1054 Last data filed at 09/27/2022 0700 Gross per 24 hour  Intake 670 ml  Output --  Net 670 ml        Physical Exam: Vital Signs Blood pressure (!) 147/60, pulse (!) 54, temperature 98.1 F (36.7 C), resp. rate 18, weight 78.7 kg, SpO2 92 %.    General: awake, alert, appropriate, BMI 31.73; sitting up in bed; NAD HENT: conjugate gaze; oropharynx moist UV:[id number] bradycardic rate; no JVD Pulmonary: CTA B/L; no W/R/R- good air movement GI: soft, NT, ND, (+)BS Psychiatric: appropriate Neurological: Ox3   No clubbing, cyanosis. 2+ b/l LE edema. Pulses are 2+ Psych: Pleasant and cheerful most of the time. Pt is cooperative Neuro:  Alert and oriented x4,follows commands, CN 2-12 grossly intact Skin: Clean and intact without signs of breakdown R hip with 2 border foam dressings- CDI Sensation decreased to LT in b/l LE in stocking glove distribution, normal insight and judgement Strength 5/5 in b/l UE Strength 5/5 in LLE Strength 1-2/5 hip flexion and knee extension, 4/5 ankle PF and DF  FTN intact b/l Musculoskeletal:  Normal bulk and tone.  R hip appropriately tender, ecchymosis along R thigh Bilateral lower extremity 1-2 edema, compression garments in place Ambulating with RW CG TTP low back Sit to stand CG Transferring with RW CG GU: total A for pericare, continent  Assessment/Plan: 1. Functional deficits which  require 3+ hours per day of interdisciplinary therapy in a comprehensive inpatient rehab setting. Physiatrist is providing close team supervision and 24 hour management of active medical problems listed below. Physiatrist and rehab team continue to assess barriers to discharge/monitor patient progress toward functional and medical goals  Care Tool:  Bathing    Body parts bathed by patient: Right arm, Left arm, Chest, Abdomen, Face, Front perineal area, Buttocks, Right upper leg, Left upper leg, Right lower leg, Left lower leg   Body parts bathed by helper: Front perineal area, Buttocks, Right upper leg, Left upper leg, Right lower leg, Left lower leg     Bathing assist Assist Level: Contact Guard/Touching assist     Upper Body Dressing/Undressing Upper body dressing   What is the patient wearing?: Pull over shirt    Upper body assist Assist Level: Set up assist    Lower Body Dressing/Undressing Lower body dressing      What is the patient wearing?: Pants     Lower body assist Assist for lower body dressing: Supervision/Verbal cueing     Toileting Toileting    Toileting assist Assist for toileting: Supervision/Verbal cueing     Transfers Chair/bed transfer  Transfers assist     Chair/bed transfer assist  level: Supervision/Verbal cueing Chair/bed transfer assistive device: Museum/gallery exhibitions officer assist      Assist level: Supervision/Verbal cueing Assistive device: Walker-rolling Max distance: 16f   Walk 10 feet activity   Assist  Walk 10 feet activity did not occur: Safety/medical concerns  Assist level: Supervision/Verbal cueing Assistive device: Walker-rolling   Walk 50 feet activity   Assist Walk 50 feet with 2 turns activity did not occur: Safety/medical concerns  Assist level: Supervision/Verbal cueing Assistive device: Walker-rolling    Walk 150 feet activity   Assist Walk 150 feet activity did not occur:  Safety/medical concerns         Walk 10 feet on uneven surface  activity   Assist Walk 10 feet on uneven surfaces activity did not occur: Safety/medical concerns   Assist level: Supervision/Verbal cueing Assistive device: Walker-rolling   Wheelchair     Assist Is the patient using a wheelchair?: Yes Type of Wheelchair: Manual    Wheelchair assist level: Supervision/Verbal cueing Max wheelchair distance: 1569f   Wheelchair 50 feet with 2 turns activity    Assist        Assist Level: Supervision/Verbal cueing   Wheelchair 150 feet activity     Assist      Assist Level: Supervision/Verbal cueing   Blood pressure (!) 147/60, pulse (!) 54, temperature 98.1 F (36.7 C), resp. rate 18, weight 78.7 kg, SpO2 92 %.  Medical Problem List and Plan: 1. Functional deficits secondary to right intertrochanteric hip fracture after mechanical fall.  Status post intramedullary nailing 08/30/2022.  Weightbearing as tolerated, no ROM restrictions              -patient may shower, soap and water only             -ELOS/Goals: 14 days             -Sutures removed 09/12/22          Con't CIR- PT and OT  Reviewed therapy notes  Discussed with patient her preference for home versus SNF 2.  Atrial fibrillation: continue Eliquis, toprol XL restarted, continue metoprolol to 12.'5mg'$  HS             -antiplatelet therapy: Plavix 75 mg daily             - RLE duplex ordered 9/30 for pain, edema post-op - negative 3. Pain Management: continue Robaxin and oxycodone as needed. Increase Robaxin frequency to q4H prn. Ordered ice to hip three times per day for 15 minutes each. Increase scheduled tylenol to 1,'0000mg'$  TID- changed start time to 5am. Schedule oxycodone '10mg'$  daily at 7:30am prior to therapy. Start vitamin C supplement to help with pain sensitivity. Provided with pain relief journal. Added oxycodone 2.'5mg'$  q4H prn for mild pain  10/21- a little sore this AM- con't regimen 4.  Anxiety: continue Ativan 0.5 mg every 6 hours as needed anxiety.  Provide emotional support             -antipsychotic agents: N/A 5. Neuropsych/cognition: This patient is capable of making decisions on her own behalf. 6. Skin/Wound Care: Routine skin checks 7. Fluids/Electrolytes/Nutrition: Routine in and outs with follow-up chemistries 8.  Acute blood loss anemia. Hgb down to 7.6, monitor weekly 9.  AKI on CKD stage III.  Baseline creatinine 1.5-1.9.  Latest follow-up creatinine 1.80.  Follow-up renal services as needed.  Renal ultrasound negative. Currently at baseline, weight stable, will decrease torsemide back to '20mg'$  daily. Monitor labs  weekly.  -10/16 Scr stable at 1.81, BUN down to 36 10.  Diabetes mellitus.  Sliding scale insulin. Check blood sugars before meals and at bedtime. Monitor closely as PO intake improves. Recommended avoiding added sugars and minimizing bread, pasta, and rice in her diet. Decrease CBG checks to AC and d/c HS sliding scale.Increase Semglee to 7U. Jardiance d/ced due to UTI Recent Labs    09/26/22 1641 09/26/22 2057 09/27/22 0611  GLUCAP 133* 83 103*   Glucose overall well controlled Decreased ISS to no more than 1U, increase Semglee to 8U  10/21- CBG's controlled- con't regimen 11.  History of seizure disorder.  continue Keppra 500 mg twice daily 12.Myelogenous leukemia.  In remission.  Follow-up outpatient with Dr. [name] 13.  Persistent atrial fibrillation. Discussed with cardiology holding low dose eliquis until XR to assess for hematoma.  Cardiac rate controlled 14.  CAD with stenting.  continue Plavix.  No chest pain or shortness of breath 15.  Chronic diastolic congestive heart failure.  Echocardiogram 04/17/2022 ejection fraction 60 to 65%.  LOSARTAN and JARDIANCE currently on hold.   Resume as needed. Increase torsemide back to home dose of '40mg'$  daily. Discussed with patient. Weights improved, swelling has improved, discussed with patient 10/18:  weight up today but swelling has improved, may be associated with increased insulin, continue to monitor.   10/21- Weight stable- con't regimen Filed Weights   09/24/22 0704 09/25/22 0309 09/26/22 0340  Weight: 79.3 kg 79.3 kg 78.7 kg     16.  History of uterine breast cancer.  Follow-up outpatient. 17.  Hyperlipidemia.  Lipitor '40mg'$  daily 18.  Constipation.  Senokot twice daily, miralax daily. Molasses enema on 10/9 ordered.   -10/16 LBM yesterday- having regular BMS, red streaks noted, continue to monitor  -placed nursing order requesting that they bring her 6 bottle waters daily as she does not feel she is drinking enough and prefers bottled water 19.  COPD with remote history of tobacco use.  Duoneb PRN- discussed that we can continue inhaler/nebulizer at home. Check oxygen saturations every shift. Goal sat is 88-92%, adjusted order. Discussed that she will not need O2 at home.  20.  Acute urinary retention.  improved, continue Flomax.   21. Indigestion             -Maalox prn 22. Low Vitamin D             -continue Ergocalciferol 50,000 units every 7 days for 7 doses. 23. Pancreas head mass. follows with Dr. Ardis Hughs 24. Obesity BMI 31.73-->32.09: provided dietary eduction. Increase torsemide given increase in weight.  25. 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  26. HTN: toprol decreased back to 12.'5mg'$  given low pressures, improved control, fluctuates, continue to monitor TID 27. UTI: d/c Jardiance, start keflex, UC shows >100,000 E coli and Klebsiella-Both sensitive to cefazolin 28. Hemorrhoid: non-painful, discussed this occurs due to straining form constipation, see#18     LOS: 22 days A FACE TO FACE EVALUATION WAS PERFORMED  Navea Woodrow 09/27/2022, 10:54 AM

## 2022-09-28 LAB — GLUCOSE, CAPILLARY
Glucose-Capillary: 133 mg/dL — ABNORMAL HIGH (ref 70–99)
Glucose-Capillary: 225 mg/dL — ABNORMAL HIGH (ref 70–99)
Glucose-Capillary: 95 mg/dL (ref 70–99)
Glucose-Capillary: 134 mg/dL — ABNORMAL HIGH (ref 70–99)

## 2022-09-28 NOTE — Progress Notes (Signed)
Occupational Therapy Session Note  Patient Details  Name: [name] MRN: [id number] Date of Birth: 1941-06-04  Today's Date: 09/28/2022 OT Individual Time: 1050-1205 OT Individual Time Calculation (min): 75 min    Short Term Goals: Week 3:  OT Short Term Goal 1 (Week 3): STG = LTG due to ELOS  Skilled Therapeutic Interventions/Progress Updates:    Upon OT arrival, pt semi recumbent in bed reporting 8/10 pain to R LE and has had her pain medications for the morning. Discussed other pain management techniques such as ice and heat. Treatment intervention with a focus on pain management, functional transfers, strengthening, endurance, and self care retraining. Pt completes supine to sit transfer with SBA and sits EOB to donn socks with use of sock aid and Supervision. Pt completes stand pivot transfer from bed to w/c with CGA. Pt was transported to ortho gym via w/c and total A for energy conservation and completes arm bike for 15 minutes with one longer rest break at 5 minutes. Pt was transported to dayroom gym via w/c and total A and completes stand pivot transfer to EOM with CGA. Pt sits EOM to complete theraband exercises using red theraband for 2x10 reps. Pt completes shoulder flex/ext, elbow flex/ext, scapular prot/retr, triceps extension, chest press. Pt requires short rest breaks and occasional verbal cues for correct form. Pt was encouraged to complete in her room on her down time. Pt completes stand pivot transfer back into w/c with CGA and was transported back to her room with total A requesting to use the bathroom. Pt completes stand step transfer from w/c to toilet and toileting  with CGA. Pt was left on commode to try and have a BM with call light in reach. NT present in the room upon departure.   Therapy Documentation Precautions:  Precautions Precautions: Fall, Other (comment) Precaution Comments: monitor SpO2 to maintain >92% (up to 2L of O2 via nasal cannula as needed), hx of  seizure disorder, no R LE ROM restrictions Restrictions Weight Bearing Restrictions: Yes RLE Weight Bearing: Weight bearing as tolerated     Therapy/Group: Individual Therapy  Marvetta Gibbons 09/28/2022, 11:17 AM

## 2022-09-28 NOTE — Progress Notes (Signed)
Physical Therapy Session Note  Patient Details  Name: [name] MRN: [id number] Date of Birth: Dec 06, 1941  Today's Date: 09/28/2022 PT Individual Time: 6004-5997 PT Individual Time Calculation (min): 56 min   Short Term Goals: Week 4:  PT Short Term Goal 1 (Week 4): STG=LTG due to LOS  Skilled Therapeutic Interventions/Progress Updates:     Pt resting in bed on arrival - awakens to voice and is agreeable to PT tx. Denies resting pain. Assisted her in applying thigh-high compression socks with dependent assist at bed level - 2+ b/l LE edema. minA for threading pants and pt able to pull them up in standing once OOB.   Supine<>sitting EOB with supervision with HOB slightly elevated and heavy reliance of bed rail support. Tennis shoes donned with totalA for time. Sit<>stand to RW from slightly boosted bed with supervision and RW. Stand<>pivot transfers completed with supervision and RW. Completed a few self care tasks at the sink (combing hair, brushing dentures, washing face, etc) without assist at wheelchair level.   Transported in w/c to main rehab gym for time and energy conservation. Gait training 39ft + 63ft (seated rest) with supervision and RW - decreased R foot clearance in swing 2/2 limited hip/knee flexion in swing. No LOB or knee buckling during gait. Distance limited by fatigue.   BLE strengthening with 2lb ankle weights while seated in w/c.  -2x10 LAQ bilaterally -2x10 hip marches bilaterally  Pt reporting need to toilet. Returned to her room at wheelchair level for urgency. Stand-pivot transfer in bathroom using grab bars with supervision - minA needed for safely stepping backwards to toilet (BSC over toilet). Pt able to lower pants in standing without assist. She was continent of bladder and completed pericare without assist. Stand pivot with supervision to return to w/c and then assisted to bed in similar manner using bed rail. She required minA for sit>supine for BLE  management with HOB flat. Repositioned self wtihout assist. Heating pad applied to RLE for pain management. Pt using call bell to call for breathing treatment. Alarm on, all needs met at end of session.  Therapy Documentation Precautions:  Precautions Precautions: Fall, Other (comment) Precaution Comments: monitor SpO2 to maintain >92% (up to 2L of O2 via nasal cannula as needed), hx of seizure disorder, no R LE ROM restrictions Restrictions Weight Bearing Restrictions: Yes RLE Weight Bearing: Weight bearing as tolerated General:    Therapy/Group: Individual Therapy  Alger Simons 09/28/2022, 7:31 AM

## 2022-09-28 NOTE — Discharge Summary (Signed)
Physician Discharge Summary  Patient ID: VERONIQUE WARGA MRN: [id number] DOB/AGE: 04/18/1941 81 y.o.  Admit date: 09/05/2022 Discharge date: 10/01/2022  Discharge Diagnoses:  Principal Problem:   Intertrochanteric fracture of right hip ([place]) DVT prophylaxis Mood stabilization Acute blood loss anemia AKI on CKD stage III Diabetes mellitus History of seizure disorder Myelogenous leukemia CAD with stenting Atrial fibrillation Chronic diastolic congestive heart failure History of uterine/breast cancer Hyperlipidemia Constipation COPD with remote tobacco use Obesity Hypertension Urinary retention-resolved Klebsiella E. coli UTI  Discharged Condition: Stable  Significant Diagnostic Studies: DG HIP UNILAT WITH PELVIS 2-3 VIEWS RIGHT  Result Date: 09/09/2022 CLINICAL DATA:  Hematoma, pain and RIGHT hip. Limited exam due to patient discomfort. EXAM: DG HIP (WITH OR WITHOUT PELVIS) 2-3V RIGHT COMPARISON:  August 30, 2022 FINDINGS: Diffuse demineralization of visualized bones. Post ORIF of the RIGHT hip, nondisplaced intratrochanteric fracture that was present on previous imaging. Intramedullary nailing and dynamic hip screw. No visible signs of fracture. No gross soft tissue swelling. No signs of new fracture. Spinal degenerative changes. Stool throughout the colon as an incidental finding. IMPRESSION: Post ORIF of the RIGHT hip, nondisplaced intratrochanteric fracture that was present on previous imaging. No signs of new fracture. Electronically Signed   By: Zetta Bills M.D.   On: 09/09/2022 12:55   VAS [place] LOWER EXTREMITY VENOUS (DVT)  Result Date: 09/07/2022  Lower Venous DVT Study Patient Name:  JEANNIFER DRAKEFORD  Date of Exam:   09/07/2022 Medical Rec #: [id number]      Accession #:    [id number] Date of Birth: 09-22-1941      Patient Gender: F Patient Age:   81 years Exam Location:  [facility] Procedure:      VAS [place] LOWER EXTREMITY VENOUS (DVT) Referring Phys: Durel Salts  --------------------------------------------------------------------------------  Indications: Swelling, and Edema.  Comparison Study: 04/17/22 prior Performing Technologist: Archie Patten RVS  Examination Guidelines: A complete evaluation includes B-mode imaging, spectral Doppler, color Doppler, and power Doppler as needed of all accessible portions of each vessel. Bilateral testing is considered an integral part of a complete examination. Limited examinations for reoccurring indications may be performed as noted. The reflux portion of the exam is performed with the patient in reverse Trendelenburg.  +--------+---------------+---------+-----------+----------+--------------------+ RIGHT   CompressibilityPhasicitySpontaneityPropertiesThrombus Aging       +--------+---------------+---------+-----------+----------+--------------------+ CFV     Full           Yes      Yes                                       +--------+---------------+---------+-----------+----------+--------------------+ SFJ     Full                                                              +--------+---------------+---------+-----------+----------+--------------------+ FV Prox Full                                                              +--------+---------------+---------+-----------+----------+--------------------+ FV Mid  Yes      Yes                  patent by color                                                           doppler              +--------+---------------+---------+-----------+----------+--------------------+ FV                     Yes      Yes                                       Distal                                                                    +--------+---------------+---------+-----------+----------+--------------------+ PFV     Full                                                               +--------+---------------+---------+-----------+----------+--------------------+ POP     Full                                                              +--------+---------------+---------+-----------+----------+--------------------+ PTV     Full           Yes      Yes                                       +--------+---------------+---------+-----------+----------+--------------------+ PERO    Full           Yes      Yes                                       +--------+---------------+---------+-----------+----------+--------------------+   +----+---------------+---------+-----------+----------+--------------+ LEFTCompressibilityPhasicitySpontaneityPropertiesThrombus Aging +----+---------------+---------+-----------+----------+--------------+ CFV Full           Yes      Yes                                 +----+---------------+---------+-----------+----------+--------------+     Summary: RIGHT: - There is no evidence of deep vein thrombosis in the lower extremity.  - No cystic structure found in the popliteal fossa.  LEFT: - No evidence of common femoral vein obstruction.  *See table(s) above for measurements and observations.  Electronically signed by Deitra Mayo MD on 09/07/2022 at 2:00:16 PM.    Final    US RENAL  Result Date: 08/31/2022 CLINICAL DATA:  Renal failure EXAM: RENAL / URINARY TRACT ULTRASOUND COMPLETE COMPARISON:  None Available. FINDINGS: Right Kidney: Renal measurements: 10.8 x 5.7 x 5.2 cm = volume: 160 a mL. Contains a 1.7 cm cyst. No follow-up imaging recommended for the cyst. Subjectively thinned cortex. Increased cortical echogenicity. Left Kidney: Renal measurements: 9.5 x 5.1 x 5.7 cm = volume: 145 mL. Subjectively thinned cortex. Increased cortical echogenicity. Bladder: Appears normal for degree of bladder distention. Other: None. IMPRESSION: Medical renal disease with increased cortical echogenicity bilaterally. No other significant  abnormalities. Electronically Signed   By: Dorise Bullion III M.D.   On: 08/31/2022 13:06   DG Hand Complete Right  Result Date: 08/30/2022 CLINICAL DATA:  Proximal thumb pain. EXAM: RIGHT HAND - COMPLETE 3+ VIEW COMPARISON:  None Available. FINDINGS: There is no evidence of fracture or dislocation. Osteoarthritic changes at the first carpo/metacarpal joint with joint space narrowing and subchondral sclerosis. IMPRESSION: Osteoarthritic changes at the first carpo/metacarpal joint. Electronically Signed   By: Fidela Salisbury M.D.   On: 08/30/2022 15:02   DG HIP UNILAT WITH PELVIS 2-3 VIEWS RIGHT  Result Date: 08/30/2022 CLINICAL DATA:  Postop. EXAM: DG HIP (WITH OR WITHOUT PELVIS) 2-3V RIGHT COMPARISON:  Preoperative imaging. FINDINGS: Femoral intramedullary nail is distal locking and trans trochanteric screw fixation of intertrochanteric femur fracture. Fracture line is not well visible, although alignment is unchanged. No new or periprosthetic fracture. Recent postsurgical change includes air and edema in the soft tissues. IMPRESSION: ORIF of right intertrochanteric femur fracture, no immediate postoperative complication. Electronically Signed   By: Keith Rake M.D.   On: 08/30/2022 10:46   DG FEMUR, MIN 2 VIEWS RIGHT  Result Date: 08/30/2022 CLINICAL DATA:  IM nail. EXAM: RIGHT FEMUR 2 VIEWS COMPARISON:  Preoperative radiograph. FINDINGS: Seven fluoroscopic spot views of the right femur obtained in the operating room. Interval intramedullary nail with trans trochanteric and distal locking screw fixation of proximal femur fracture. There is also a single fluoroscopic spot view of the ankle. Fluoroscopy time 67 seconds. Dose 19.32 mGy. IMPRESSION: Intraoperative fluoroscopy during ORIF of right femur fracture. Electronically Signed   By: Keith Rake M.D.   On: 08/30/2022 10:16   CT Hip Right Wo Contrast  Result Date: 08/29/2022 CLINICAL DATA:  Hip trauma, fracture suspected, xray done  r/o occult hip or pelvic/pubic rami fx EXAM: CT OF THE RIGHT HIP WITHOUT CONTRAST TECHNIQUE: Multidetector CT imaging of the right hip was performed according to the standard protocol. Multiplanar CT image reconstructions were also generated. RADIATION DOSE REDUCTION: This exam was performed according to the departmental dose-optimization program which includes automated exposure control, adjustment of the mA and/or kV according to patient size and/or use of iterative reconstruction technique. COMPARISON:  Same day radiographs FINDINGS: Bones/Joint/Cartilage There is a nondisplaced fracture of the intertrochanteric right proximal femur, and comminuted involvement of the greater trochanter. Unchanged diffuse mixed sclerosis and lucency within visible pelvis. Ligaments Suboptimally assessed by CT. Muscles and Tendons No acute myotendinous abnormality by CT. Soft tissues No focal fluid collection.  Soft tissue swelling along the hip. IMPRESSION: Nondisplaced intertrochanteric fracture of the right proximal femur, with comminuted involvement of the greater trochanter. Unchanged diffuse mixed sclerosis and lucency within the visible pelvis, suggestive of metastatic disease, as noted on prior exams. Electronically Signed   By: Maurine Simmering M.D.   On: 08/29/2022  15:56   CT HEAD WO CONTRAST (5MM)  Result Date: 08/29/2022 CLINICAL DATA:  Trauma, fall EXAM: CT HEAD WITHOUT CONTRAST TECHNIQUE: Contiguous axial images were obtained from the base of the skull through the vertex without intravenous contrast. RADIATION DOSE REDUCTION: This exam was performed according to the departmental dose-optimization program which includes automated exposure control, adjustment of the mA and/or kV according to patient size and/or use of iterative reconstruction technique. COMPARISON:  12/05/2021 FINDINGS: Brain: No acute intracranial findings are seen in noncontrast CT brain. There are no signs of bleeding within the cranium. Cortical  sulci are prominent. There is decreased density in periventricular and subcortical white matter. There is 1.7 cm dense calcification inseparable from the anterior falx, possibly meningioma with no significant interval change. There is no adjacent edema. Vascular: There are scattered arterial calcifications. Skull: No acute findings are seen. Sinuses/Orbits: There is mild mucosal thickening in ethmoid and sphenoid sinuses. Other: None. IMPRESSION: No acute intracranial findings are seen. Atrophy. Small-vessel disease. There is 1.7 cm dense calcification inseparable from anterior falx suggesting meningioma with no significant interval change. Electronically Signed   By: Elmer Picker M.D.   On: 08/29/2022 15:45   DG FEMUR, MIN 2 VIEWS RIGHT  Result Date: 08/29/2022 CLINICAL DATA:  [place] from standing while at work.  Right hip pain. EXAM: RIGHT FEMUR 2 VIEWS COMPARISON:  Right hip radiographs 08/29/2022, CT abdomen and pelvis 04/17/2022 FINDINGS: Mild-to-moderate medial compartment of the knee joint space narrowing. There is subtle linear lucency overlying the right intertrochanteric region on lateral view. Mild chronic enthesopathic change at the quadriceps insertion on the patella. No joint effusion. Mild right femoroacetabular joint space narrowing and peripheral acetabular degenerative osteophytosis. There are dense vascular calcifications. IMPRESSION: There is craniocaudal linear lucency overlying the superior intertrochanteric region on lateral view only. This could represent an overlying skin marking. It is difficult to exclude an acute nondisplaced fracture in this region. Electronically Signed   By: Yvonne Kendall M.D.   On: 08/29/2022 15:27   DG Tibia/Fibula Right  Result Date: 08/29/2022 CLINICAL DATA:  Trauma, fall EXAM: RIGHT TIBIA AND FIBULA - 2 VIEW COMPARISON:  None Available. FINDINGS: No recent fracture or dislocation is seen. Osteopenia is seen in bony structures. There is soft tissue  swelling around the ankle. There is edema in subcutaneous plane in the lower leg. IMPRESSION: No fracture or dislocation is seen in right tibia and fibula. Electronically Signed   By: Elmer Picker M.D.   On: 08/29/2022 15:24    Labs:  Basic Metabolic Panel: Recent Labs  Lab 09/23/22 0405  NA 142  K 4.8  CL 105  CO2 29  GLUCOSE 95  BUN 44*  CREATININE 1.72*  CALCIUM 8.1*    CBC: Recent Labs  Lab 09/23/22 0405  WBC 3.6*  HGB 7.9*  HCT 25.0*  MCV 97.3  PLT 175    CBG: Recent Labs  Lab 09/27/22 1158 09/27/22 1622 09/27/22 2059 09/28/22 0559 09/28/22 1211  GLUCAP 185* 130* 176* 133* 225*   Family history.  Father with COPD and esophageal cancer.  Brother with esophageal cancer.  Paternal grandmother with CVA.  Denies any colon cancer or rectal cancer  Brief HPI:   ROLENA KNUTSON is a 81 y.o. right-handed female with history of myelogenous leukemia in remission atrial fibrillation on Eliquis chronic back pain seizure disorder maintained on Keppra COPD remote tobacco use CAD with stenting diabetes mellitus hypertension chronic diastolic congestive heart failure uterine/breast cancer, CKD stage III.  Per chart review patient lives with spouse.  Independent prior to admission.  She was still working.  Presented 08/29/2022 after sustaining ground-level fall while at work.  She reports she tripped on a rug.  No loss of consciousness.  Cranial CT scan negative.  X-rays and imaging revealed right intertrochanteric hip fracture.  Underwent intramedullary nailing of right hip 08/30/2022 per Dr. Ginette Pitman.  Weightbearing as tolerated.  Chronic Eliquis ongoing as prior to admission as well as Plavix.  Acute blood loss anemia 9.5 and monitored.  Renal service for history of AKI on CKD creatinine baseline 2.41 on discharge peaking to 3.8.  She did receive gentle IV fluids.  Renal ultrasound negative latest creatinine 1.80.  Intravenous Lasix was given 9/28 9/29 due to volume overload.  Bouts of  urinary retention initially with Foley tube.  Therapy evaluations completed due to patient decreased functional mobility was admitted for a comprehensive rehab program.   Hospital Course: LEIGHLA CHESTNUTT was admitted to rehab 09/05/2022 for inpatient therapies to consist of PT, ST and OT at least three hours five days a week. Past admission physiatrist, therapy team and rehab RN have worked together to provide customized collaborative inpatient rehab.  Pertaining to patient's right intertrochanteric hip fracture.  Status post IM nailing 08/30/2022.  Patient would follow-up orthopedic services.  Weightbearing as tolerated neurovascular sensation intact.  She remained on chronic Eliquis for history of atrial fibrillation cardiac rate controlled also maintained on Plavix for history of CAD with stenting.  Pain managed with use of Lidoderm patch with Robaxin and oxycodone as needed for breakthrough pain.  Mood stabilization with Ativan as needed as well as emotional support provided.  AKI on CKD stage III baseline creatinine 1.5-1.9 and latest creatinine 1.72.  Blood sugars controlled maintained on insulin therapy.  History of seizure disorder with Keppra as prior to admission no seizure activity.  Myelogenous leukemia in remission follow-up outpatient oncology services.  Chronic diastolic congestive heart failure echocardiogram 04/17/2022 ejection fraction 60 to 65%.  Her losartan and Jardiance initially on hold exhibiting no signs of fluid overload she will continue her torsemide as advised.  Lipitor ongoing for hyperlipidemia.  History of COPD remote tobacco use oxygen saturations maintained.  Noted obesity BMI 31.73 dietary follow-up.  Noted E. coli Klebsiella UTI completing course of Keflex.  No dysuria or hematuria.   Blood pressures were monitored on TID basis and controlled  Diabetes has been monitored with ac/hs CBG checks and SSI was use prn for tighter BS control.    Rehab course: During patient's stay  in rehab weekly team conferences were held to monitor patient's progress, set goals and discuss barriers to discharge. At admission, patient required minimal assist to moderate assist sit to stand moderate assist step pivot transfers  Physical exam.  Blood pressure 155/46 pulse 98 temperature 97.9 respirations 18 oxygen saturations 95% room air Constitutional.  No acute distress HEENT Head.  Normocephalic and atraumatic Eyes.  Pupils round and reactive to light no discharge without nystagmus Neck.  Supple nontender no JVD without thyromegaly Cardiac regular rate and rhythm without any extra sounds or murmur heard Abdomen.  Soft nontender positive bowel sounds without rebound Respiratory effort normal no respiratory distress without wheeze Skin.  Incision clean dry and intact without signs of breakdown Neurologic.  Alert oriented x3  He/She  has had improvement in activity tolerance, balance, postural control as well as ability to compensate for deficits. He/She has had improvement in functional use RUE/LUE  and RLE/LLE as  well as improvement in awareness.  Supine to sit edge of bed supervision.  She could put on her shoes with some assistance.  Sit to stand rolling walker from slightly boosted bed with supervision.  Ambulates 95 feet rolling walker contact-guard.  She was able to lower her pants in standing without assistance.  Stand pivot transfers and bathroom using grab bars with supervision.  Full family teaching completed plan discharge to home       Disposition: Discharge to home    Diet: Diabetic diet  Special Instructions: No driving smoking or alcohol  Weightbearing as tolerated right lower extremity  Medications at discharge. 1.  Tylenol as needed 2.  Eliquis 2.5 mg p.o. twice daily 3.  Vitamin C 1000 mg p.o. daily 4.  Lipitor 40 mg p.o. daily 5.  Plavix 75 mg p.o. daily 6.  Colace 100 mg p.o. twice daily 7.  Toujeo 6 units into the skin at bedtime 8.  Diovan 80 mg  daily 9.  Imdur 30 mg p.o. daily 10.  Keppra 500 mg p.o. twice daily 11.  Lidoderm patch change as directed 12.  Ativan 0.5 mg p.o. every 6 hours as needed anxiety 13.  Melatonin 3 mg p.o. nightly 14.  Robaxin 500 mg every 4 hours as needed muscle spasms 15.  Toprol-XL 12.5 mg p.o. nightly 16.  Multivitamin daily 17.  Oxycodone 5 to 10 mg every 4 hours as needed pain 18.  MiraLAX daily hold for loose stool 19.  Metamucil daily 20.  Sodium bicarbonate 650 mg p.o. twice daily 21.  Vitamin D 50,000 units every 7 days 22.  Demadex 40 mg p.o. daily   23.  Jardiance 25 mg daily 24.  Valsartan 80 mg daily 25.  Breo Ellipta 200-25 mcg 1 puff into the lungs as needed  30-35 minutes were spent completing discharge summary and discharge planning    Follow-up Information     Raulkar, Clide Deutscher, MD Follow up.   Specialty: Physical Medicine and Rehabilitation Why: No formal follow-up needed Contact information: [address] [place] [address] [phone]         Altamese Exeter, MD Follow up.   Specialty: Orthopedic Surgery Why: Call for appointment Contact information: [place] [address] [phone]         Nahser, Wonda Cheng, MD Follow up.   Specialty: Cardiology Why: Call for appointment Contact information: [address] [place] [address] [phone]         Ladell Pier, MD Follow up.   Specialty: Oncology Why: Call for appointment as needed Contact information: [place] [address] [phone]                 Signed: Lavon Paganini Brianni Manthe 09/28/2022, 2:05 PM

## 2022-09-29 LAB — GLUCOSE, CAPILLARY
Glucose-Capillary: 89 mg/dL (ref 70–99)
Glucose-Capillary: 167 mg/dL — ABNORMAL HIGH (ref 70–99)
Glucose-Capillary: 100 mg/dL — ABNORMAL HIGH (ref 70–99)
Glucose-Capillary: 159 mg/dL — ABNORMAL HIGH (ref 70–99)

## 2022-09-29 MED ORDER — INSULIN GLARGINE-YFGN 100 UNIT/ML ~~LOC~~ SOLN: 4.0000 [IU] | Freq: Every day | SUBCUTANEOUS | Status: DC

## 2022-09-29 MED ORDER — EMPAGLIFLOZIN 25 MG PO TABS
25.0000 mg | ORAL_TABLET | Freq: Every day | ORAL | Status: DC
  Administered 2022-09-29 – 2022-10-01 (×3): 25 mg via ORAL
  Filled 2022-09-29 (×3): qty 1

## 2022-09-29 MED ORDER — CYANOCOBALAMIN 1000 MCG/ML IJ SOLN
1000.0000 ug | Freq: Once | INTRAMUSCULAR | Status: AC
  Administered 2022-09-29: 1000 ug via INTRAMUSCULAR
  Filled 2022-09-29: qty 1

## 2022-09-29 NOTE — Progress Notes (Signed)
Physical Therapy Session Note  Patient Details  Name: DONNI OGLESBY MRN: [id number] Date of Birth: 09/28/1941  Today's Date: 09/29/2022 PT Individual Time: 1045-1109 PT Individual Time Calculation (min): 24 min   Short Term Goals: Week 4:  PT Short Term Goal 1 (Week 4): STG=LTG due to LOS  Skilled Therapeutic Interventions/Progress Updates:      Pt supine in bed to start - reports 6/10 R hip pain - rest breaks and exercises completed for pain management.   Focused session on supine there-ex at bed level to avoid fatigue and encourage energy conservation for upcoming therapy schedule:  -2x10 hip abduction, bilaterally -2x25 ankle pumps, bilaterally -2x10 heel slides, bilaterally (AAROM for RLE) -2x10 glut sets -2x10 quad sets  Music played (hank williams, willie nelson) to improve engagement and affect.   Pt left in bed at end of session, alarm on with all needs met. RN in room for handoff of care.  Therapy Documentation Precautions:  Precautions Precautions: Fall, Other (comment) Precaution Comments: monitor SpO2 to maintain >92% (up to 2L of O2 via nasal cannula as needed), hx of seizure disorder, no R LE ROM restrictions Restrictions Weight Bearing Restrictions: Yes RLE Weight Bearing: Weight bearing as tolerated General:    Therapy/Group: Individual Therapy  Alger Simons 09/29/2022, 10:51 AM

## 2022-09-29 NOTE — Progress Notes (Signed)
Occupational Therapy Discharge Summary  Patient Details  Name: [name] MRN: [id number] Date of Birth: 01/03/1941  Date of Discharge from [place] 24, 2023   Patient has met 6 of 6 long term goals due to improved activity tolerance, improved balance, ability to compensate for deficits, functional use of  RIGHT lower extremity, improved awareness, and improved coordination.  Patient to discharge at overall Supervision level for ADLs, and mod I for transfers.  Patient's care partner unavailable to provide the necessary physical assistance at discharge but is able to provide 24/7 supervision. Her brother, sister and daughter have all voiced agreement to assist pt with her transition home and with tasks that her husband is unable to.    Reasons goals not met: NA  Recommendation:  Patient will benefit from ongoing skilled OT services in home health setting to continue to advance functional skills in the area of BADL and iADL.  Equipment: Wide BSC, Tub transfers bench, walker tray, and full adaptive equipment hip kit (reacher, long handled shoe horn, sock aid)  Reasons for discharge: treatment goals met  Patient/family agrees with progress made and goals achieved: Yes  OT Discharge Precautions/Restrictions  Precautions Precautions: Fall;Other (comment) Precaution Comments: monitor SpO2 to maintain >92%, hx of seizure disorder, no R LE ROM restrictions Restrictions Weight Bearing Restrictions: No RLE Weight Bearing: Weight bearing as tolerated ADL ADL Equipment Provided: Long-handled sponge, Leg lifter Eating: Independent Where Assessed-Eating: Wheelchair Grooming: Modified independent Where Assessed-Grooming: Sitting at sink Upper Body Bathing: Modified independent Where Assessed-Upper Body Bathing: Sitting at sink Lower Body Bathing: Supervision/safety Where Assessed-Lower Body Bathing: Sitting at sink, Standing at sink Upper Body Dressing: Modified independent  (Device) Where Assessed-Upper Body Dressing: Wheelchair Lower Body Dressing: Supervision/safety Where Assessed-Lower Body Dressing: Sitting at sink, Standing at sink Toileting: Supervision/safety Where Assessed-Toileting: Bedside Commode Toilet Transfer: Close supervision Toilet Transfer Method: Counselling psychologist: Extra wide bedside commode, Grab bars Tub/Shower Transfer: Minimal assistance Tub/Shower Transfer Method: Sit pivot, Ambulating Tub/Shower Equipment: Grab bars, Facilities manager: Curator Method: Ambulating, Sit pivot Youth worker: Radio broadcast assistant, Grab bars Vision Baseline Vision/History: 1 Wears glasses Patient Visual Report: No change from baseline Vision Assessment?: No apparent visual deficits Perception  Perception: Within Functional Limits Praxis Praxis: Intact Cognition Cognition Overall Cognitive Status: Within Functional Limits for tasks assessed Arousal/Alertness: Awake/alert Orientation Level: Person;Place;Situation Person: Oriented Place: Oriented Situation: Oriented Memory: Appears intact Attention: Focused;Sustained;Selective Focused Attention: Appears intact Sustained Attention: Appears intact Selective Attention: Appears intact Awareness: Appears intact Problem Solving: Appears intact Safety/Judgment: Appears intact Brief Interview for Mental Status (BIMS) Repetition of Three Words (First Attempt): 3 Temporal Orientation: Year: Correct Temporal Orientation: Month: Accurate within 5 days Temporal Orientation: Day: Correct Recall: "Sock": Yes, no cue required Recall: "Blue": Yes, no cue required Recall: "Bed": No, could not recall BIMS Summary Score: 13 Sensation Sensation Light Touch: Appears Intact Hot/Cold: Appears Intact Proprioception: Appears Intact Stereognosis: Not tested Coordination Gross Motor Movements are Fluid and Coordinated: No Fine  Motor Movements are Fluid and Coordinated: No Coordination and Movement Description: mildly uncoordinated due to weakness of RLE with generalized deconditioning Finger Nose Finger Test: mild tremors/dysmetria bilaterally however WFL Motor  Motor Motor: Within Functional Limits Mobility  Bed Mobility Bed Mobility: Sit to Supine;Supine to Sit Supine to Sit: Independent with assistive device (using bedrail) Sit to Supine: Independent with assistive device (using leg lifter) Transfers Sit to Stand: Independent with assistive device Stand to Sit: Independent with  assistive device  Trunk/Postural Assessment  Cervical Assessment Cervical Assessment: Exceptions to [facility] (slight forward head) Thoracic Assessment Thoracic Assessment: Exceptions to [facility] (rounded shoulders with mild thoracic kyphosis) Lumbar Assessment Lumbar Assessment: Exceptions to [facility] (flexible posterior pelvic tilt in sitting) Postural Control Postural Control: Within Functional Limits (using B UE support on RW)  Balance Balance Balance Assessed: Yes Standardized Balance Assessment Standardized Balance Assessment: Timed Up and Go Test Timed Up and Go Test TUG: Normal TUG Normal TUG (seconds): 56 (requires use of RW - 9mn5sec on 1st trial) Static Sitting Balance Static Sitting - Balance Support: Feet supported;Bilateral upper extremity supported Static Sitting - Level of Assistance: 6: Modified independent (Device/Increase time) Dynamic Sitting Balance Dynamic Sitting - Balance Support: Feet supported;Bilateral upper extremity supported Dynamic Sitting - Level of Assistance: 6: Modified independent (Device/Increase time) Static Standing Balance Static Standing - Balance Support: During functional activity;Bilateral upper extremity supported Static Standing - Level of Assistance: 5: Stand by assistance Dynamic Standing Balance Dynamic Standing - Balance Support: During functional activity;Bilateral upper extremity  supported Dynamic Standing - Level of Assistance: 5: Stand by assistance Extremity/Trunk Assessment RUE Assessment RUE Assessment: Within Functional Limits LUE Assessment LUE Assessment: Within Functional Limits    E Kluttz, MS, OTR/L  09/30/2022, 3:46 PM

## 2022-09-29 NOTE — Progress Notes (Signed)
PROGRESS NOTE   Subjective/Complaints: No new complaints this morning 6/10 right hip pain Discussed improvements in CBGs, restarting her Jardiance    ROS:    Pt denies SOB, abd pain, CP, N/V/C/D, and vision changes, +right hip pain   Objective:   No results found. No results for input(s): "WBC", "HGB", "HCT", "PLT" in the last 72 hours.   No results for input(s): "NA", "K", "CL", "CO2", "GLUCOSE", "BUN", "CREATININE", "CALCIUM" in the last 72 hours.    Intake/Output Summary (Last 24 hours) at 09/29/2022 1248 Last data filed at 09/29/2022 0835 Gross per 24 hour  Intake 697 ml  Output --  Net 697 ml        Physical Exam: Vital Signs Blood pressure (!) 132/48, pulse 61, temperature 97.6 F (36.4 C), temperature source Oral, resp. rate 18, weight 78.6 kg, SpO2 94 %. General: awake, alert, appropriate, BMI 31.69; sitting up in bed; NAD HENT: conjugate gaze; oropharynx moist NT:[id number] bradycardic rate; no JVD Pulmonary: CTA B/L; no W/R/R- good air movement GI: soft, NT, ND, (+)BS Psychiatric: appropriate Neurological: Ox3   No clubbing, cyanosis. 2+ b/l LE edema. Pulses are 2+ Psych: Pleasant and cheerful most of the time. Pt is cooperative Neuro:  Alert and oriented x4,follows commands, CN 2-12 grossly intact Skin: Clean and intact without signs of breakdown R hip with 2 border foam dressings- CDI Sensation decreased to LT in b/l LE in stocking glove distribution, normal insight and judgement Strength 5/5 in b/l UE Strength 5/5 in LLE Strength 1-2/5 hip flexion and knee extension, 4/5 ankle PF and DF  FTN intact b/l Musculoskeletal:  Normal bulk and tone.  R hip appropriately tender, ecchymosis along R thigh Bilateral lower extremity 1-2 edema, compression garments in place Ambulating with RW CG TTP low back Functional mobility: Can come to edge of bed independently Sit to stand CG Transferring  with RW CG GU: total A for pericare, continent  Assessment/Plan: 1. Functional deficits which require 3+ hours per day of interdisciplinary therapy in a comprehensive inpatient rehab setting. Physiatrist is providing close team supervision and 24 hour management of active medical problems listed below. Physiatrist and rehab team continue to assess barriers to discharge/monitor patient progress toward functional and medical goals  Care Tool:  Bathing    Body parts bathed by patient: Right arm, Left arm, Chest, Abdomen, Face, Front perineal area, Buttocks, Right upper leg, Left upper leg, Right lower leg, Left lower leg   Body parts bathed by helper: Front perineal area, Buttocks, Right upper leg, Left upper leg, Right lower leg, Left lower leg     Bathing assist Assist Level: Contact Guard/Touching assist     Upper Body Dressing/Undressing Upper body dressing   What is the patient wearing?: Pull over shirt    Upper body assist Assist Level: Set up assist    Lower Body Dressing/Undressing Lower body dressing      What is the patient wearing?: Incontinence brief, Pants     Lower body assist Assist for lower body dressing: Supervision/Verbal cueing     Toileting Toileting    Toileting assist Assist for toileting: Independent with assistive device     Transfers Chair/bed transfer  Transfers assist     Chair/bed transfer assist level: Supervision/Verbal cueing Chair/bed transfer assistive device: Programmer, multimedia   Ambulation assist      Assist level: Supervision/Verbal cueing Assistive device: Walker-rolling Max distance: 100   Walk 10 feet activity   Assist  Walk 10 feet activity did not occur: Safety/medical concerns  Assist level: Supervision/Verbal cueing Assistive device: Walker-rolling   Walk 50 feet activity   Assist Walk 50 feet with 2 turns activity did not occur: Safety/medical concerns  Assist level: Supervision/Verbal  cueing Assistive device: Walker-rolling    Walk 150 feet activity   Assist Walk 150 feet activity did not occur: Safety/medical concerns         Walk 10 feet on uneven surface  activity   Assist Walk 10 feet on uneven surfaces activity did not occur: Safety/medical concerns   Assist level: Supervision/Verbal cueing Assistive device: Walker-rolling   Wheelchair     Assist Is the patient using a wheelchair?: Yes Type of Wheelchair: Manual    Wheelchair assist level: Supervision/Verbal cueing Max wheelchair distance: 100    Wheelchair 50 feet with 2 turns activity    Assist        Assist Level: Supervision/Verbal cueing   Wheelchair 150 feet activity     Assist      Assist Level: Supervision/Verbal cueing   Blood pressure (!) 132/48, pulse 61, temperature 97.6 F (36.4 C), temperature source Oral, resp. rate 18, weight 78.6 kg, SpO2 94 %.  Medical Problem List and Plan: 1. Functional deficits secondary to right intertrochanteric hip fracture after mechanical fall.  Status post intramedullary nailing 08/30/2022.  Weightbearing as tolerated, no ROM restrictions              -patient may shower, soap and water only             -ELOS/Goals: 14 days             -Sutures removed 09/12/22          Continue CIR- PT and OT  Reviewed therapy notes  Discussed with patient her preference for home versus SNF 2.  Atrial fibrillation: continue Eliquis, toprol XL restarted, continue metoprolol to 12.'5mg'$  HS             -antiplatelet therapy: Plavix 75 mg daily             - RLE duplex ordered 9/30 for pain, edema post-op - negative 3. Pain Management: continue Robaxin and oxycodone as needed. Increase Robaxin frequency to q4H prn. Ordered ice to hip three times per day for 15 minutes each. Increase scheduled tylenol to 1,'0000mg'$  TID- changed start time to 5am. Schedule oxycodone '10mg'$  daily at 7:30am prior to therapy. Start vitamin C supplement to help with pain  sensitivity. Provided with pain relief journal. Added oxycodone 2.'5mg'$  q4H prn for mild pain  10/21- a little sore this AM- con't regimen 4. Anxiety: continue Ativan 0.5 mg every 6 hours as needed anxiety.  Provide emotional support             -antipsychotic agents: N/A 5. Neuropsych/cognition: This patient is capable of making decisions on her own behalf. 6. Skin/Wound Care: Routine skin checks 7. Fluids/Electrolytes/Nutrition: Routine in and outs with follow-up chemistries 8.  Acute blood loss anemia. Hgb down to 7.6, monitor weekly 9.  AKI on CKD stage III.  Baseline creatinine 1.5-1.9.  Latest follow-up creatinine 1.80.  Follow-up renal services as needed.  Renal ultrasound negative. Currently at baseline,  weight stable, will decrease torsemide back to '20mg'$  daily. Monitor labs weekly.  -10/16 Scr stable at 1.81, BUN down to 36 10.  Diabetes mellitus.  Sliding scale insulin. Check blood sugars before meals and at bedtime. Monitor closely as PO intake improves. Recommended avoiding added sugars and minimizing bread, pasta, and rice in her diet. Decrease CBG checks to AC and d/c HS sliding scale. D/c Semglee. Restarted Jardiance Recent Labs    09/28/22 2058 09/29/22 0605 09/29/22 1128  GLUCAP 134* 89 167*   Glucose overall well controlled Decreased ISS to no more than 1U, increase Semglee to 8U  10/21- CBG's controlled- con't regimen 11.  History of seizure disorder.  continue Keppra 500 mg twice daily 12.Myelogenous leukemia.  In remission.  Follow-up outpatient with Dr. [name] 13.  Persistent atrial fibrillation. Discussed with cardiology holding low dose eliquis until XR to assess for hematoma.  Cardiac rate controlled 14.  CAD with stenting.  continue Plavix.  No chest pain or shortness of breath 15.  Chronic diastolic congestive heart failure.  Echocardiogram 04/17/2022 ejection fraction 60 to 65%.  LOSARTAN and JARDIANCE currently on hold.   Resume as needed. Increase torsemide back  to home dose of '40mg'$  daily. Discussed with patient. Weights improved, swelling has improved, discussed with patient 10/18: weight up today but swelling has improved, may be associated with increased insulin, continue to monitor.   10/21- Weight stable- con't regimen Filed Weights   09/26/22 0340 09/28/22 0504 09/29/22 0500  Weight: 78.7 kg 78.6 kg 78.6 kg     16.  History of uterine breast cancer.  Follow-up outpatient. 17.  Hyperlipidemia.  Lipitor '40mg'$  daily 18.  Constipation.  Senokot twice daily, miralax daily. Molasses enema on 10/9 ordered. Encouraged adequate hydration 19.  COPD with remote history of tobacco use.  Duoneb PRN- discussed that we can continue inhaler/nebulizer at home. Check oxygen saturations every shift. Goal sat is 88-92%, adjusted order. Discussed that she will not need O2 at home.  20.  Acute urinary retention.  improved, continue Flomax.   21. Indigestion             -Maalox prn 22. Low Vitamin D             -continue Ergocalciferol 50,000 units every 7 days for 7 doses. 23. Pancreas head mass. follows with Dr. Ardis Hughs 24. Obesity BMI 31.73-->32.09: provided dietary eduction. Increase torsemide given increase in weight.  25. 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  26. HTN: toprol decreased back to 12.'5mg'$  given low pressures, improved control, fluctuates, continue to monitor TID 27. UTI: d/c Jardiance, start keflex, UC shows >100,000 E coli and Klebsiella-Both sensitive to cefazolin 28. Hemorrhoid: non-painful, discussed this occurs due to straining form constipation, see#18     LOS: 24 days A FACE TO FACE EVALUATION WAS PERFORMED  Clide Deutscher Ramzi Brathwaite 09/29/2022, 12:48 PM

## 2022-09-29 NOTE — Progress Notes (Signed)
Patient ID: [name], female   DOB: Jan 29, 1941, 81 y.o.   MRN: [id number]  Sw informed patient will be able to complete car transfers on day of d/c. Patient also able to navigate steps at [facility] level. No additional questions or concerns.

## 2022-09-29 NOTE — Progress Notes (Signed)
Occupational Therapy Session Note  Patient Details  Name: [name] MRN: [id number] Date of Birth: Apr 21, 1941  Today's Date: 09/29/2022 OT Individual Time: 1115-1155 OT Individual Time Calculation (min): 40 min    Short Term Goals: Week 3:  OT Short Term Goal 1 (Week 3): STG = LTG due to ELOS  Skilled Therapeutic Interventions/Progress Updates:    Pt received in bed and anxious to get to the bathroom. She explained that she needed to get out on the Right side of the bed and she has an adjustable bed at home.  Had pt use bed controls and her leg lifter. Pt able to come to EOB without A.  Helped pt don shoes for time as pt eager to toilet. She stood and ambulated with RW to toilet with supervision and toileted with mod I.  She ambulated to sink to wash hands and comb hair.   Pt then sat in wc with B LE elevated.  Pt engaged in UE exercises she can do at home with yellow theraband in her room.  Exercises focused on upper back and torso strength (single and double arm rows at chest height and over head, torso punches with resistance) but she also worked biceps, triceps.   Pt often getting out of breath and needing a few short rest breaks.   Pt set up with bed tray at wc as lunch about to arrive. In wc with belt alarm on and all needs met.   Therapy Documentation Precautions:  Precautions Precautions: Fall, Other (comment) Precaution Comments: monitor SpO2 to maintain >92% (up to 2L of O2 via nasal cannula as needed), hx of seizure disorder, no R LE ROM restrictions Restrictions Weight Bearing Restrictions: Yes RLE Weight Bearing: Weight bearing as tolerated General:   Vital Signs:  Pain:   ADL: ADL Eating: Modified independent Where Assessed-Eating: Bed level Grooming: Supervision/safety Where Assessed-Grooming: Sitting at sink, Wheelchair Upper Body Bathing: Minimal assistance Where Assessed-Upper Body Bathing: Bed level Lower Body Bathing: Dependent Where Assessed-Lower  Body Bathing: Bed level Upper Body Dressing: Moderate assistance Where Assessed-Upper Body Dressing: Edge of bed Lower Body Dressing: Dependent Where Assessed-Lower Body Dressing: Edge of bed Toileting: Contact guard Where Assessed-Toileting: Bedside Commode (over toilet) Toilet Transfer: Contact guard Toilet Transfer Method: Stand pivot Toilet Transfer Equipment: Bedside commode, Grab bars  Therapy/Group: Individual Therapy  [place] 09/29/2022, 8:31 AM

## 2022-09-29 NOTE — Progress Notes (Signed)
Occupational Therapy Session Note  Patient Details  Name: [name] MRN: [id number] Date of Birth: 04-16-1941  Today's Date: 09/29/2022 OT Individual Time: 4235-3614 OT Individual Time Calculation (min): 60 min    Short Term Goals: Week 3:  OT Short Term Goal 1 (Week 3): STG = LTG due to ELOS  Skilled Therapeutic Interventions/Progress Updates:  Skilled OT intervention completed with focus on AE education, ADL retraining, toileting needs and bed mobility modification education. Pt received seated in w/c, no number given for pain however indicated "I'm alright right now." Pt requesting to freshen up at sink as she was received with no pants on and without TEDS. Received on RA and remained for entirety of session with SPO2 WNLs.  Able to self-propel herself to sink for facial and oral hygiene with mod I. Pt was able to retrieve her fresh pants with mod I at w/c level. Focus of ADLs with pt completing tasks as independently as possible even if requiring extra time, to prep for d/c at supervision level. With sock aid, pt was able to donn thigh high TEDS with min A, discussed her using knee high for ease of management at home. Donned pants with supervision at the sit > stand level using RW. Shoes donned with LH shoe horn and supervision, with cues needed for efficiency. Cues also needed for slowing down for energy conservation, due to SOB and frustration.   Supervision sit > stand and ambulatory transfer using RW from w/c <> BSC in bathroom. Completed toileting steps with supervision, including doffing/threading of new brief. Pt indicated most difficulty with retrieving items, with education provided about how to prep toileting items near her I.e. brief on counter or items next to [facility] to prevent needing assist with gathering them in the middle of toileting.  Pt indicated she still can't ger herself in and out of the bed by herself. She stated she sleeps on R side of bed, with therapist encouraging  pt to enter/exit the bed on the R side for the rest of her stay to get increased practice. Stand pivot with supervision to EOB, then attempted to transition to R sidelying> supine however pt was unable to lift either LE. Issued pt a leg lifter, with education provided on purpose and technique, with pt then able to return demo supervision sitting EOB > supine transition with use of leg lifter to each leg. Cues needed for efficiency but pt able to do without physical assist.   Pt remained upright in bed, with bed alarm on and all needs in reach at end of session.   Therapy Documentation Precautions:  Precautions Precautions: Fall, Other (comment) Precaution Comments: monitor SpO2 to maintain >92% (up to 2L of O2 via nasal cannula as needed), hx of seizure disorder, no R LE ROM restrictions Restrictions Weight Bearing Restrictions: Yes RLE Weight Bearing: Weight bearing as tolerated    Therapy/Group: Individual Therapy  Blase Mess, MS, OTR/L  09/29/2022, 12:13 PM

## 2022-09-29 NOTE — Progress Notes (Signed)
Physical Therapy Session Note  Patient Details  Name: [name] MRN: [id number] Date of Birth: [birth year]  Today's Date: 09/29/2022 PT Individual Time: 5732-2025 and 1550-1630 PT Individual Time Calculation (min): 42 min and 40 min  Short Term Goals: Week 4:  PT Short Term Goal 1 (Week 4): STG=LTG due to LOS  Skilled Therapeutic Interventions/Progress Updates:     Pt received seated in [facility] and agrees to therapy. Reports pain in R lower extremity. Number not provided. PT provides rest breaks as needed to manage pain. WC transport to gym. Pt performs x20 alternating LAQs prior to ambulation. Pt performs sit to stand with RW and cues for initiation and sequencing. Pt ambulates x146' with RW and close supervision, with cues for upright gaze to improve posture and balance, increasing R step height for safety, and decreasing WB through RW for energy conservation and body mechanics. Pt ambulates slowly and requires x1-2 brief standing rest breaks.  Following extended seated break, pt completes x5 reps of sit to stand with RW, and cues for optimal body mechanics. Performed for functional strengthening and transfer training. WC transport back to room. Left seated with alarm intact and all needs within reach.  2nd Session: Pt received seated in [facility] and agrees to therapy. Reports pain in R lower extremity. Pt provides rest breaks and mobility to manage pain. WC transport to gym. Pt performs car transfer following PT demonstration, with verbal cues for positioning and minA to manage R lower extremity. Extended seated rest break following car transfer.  Pt performs x4 3" steps with R handrail and minA to manually facilitate lifting L leg onto step. PT provides cues for step sequencing and body mechanics. Following stairs, PT calls pt's daughter with pt's permission, and provides update on pt's mobility status and recommendations for guarding pt when performing car transfer and stairs upon discharge.  Pt  ambulates to fatigue, x100', with RW and cues for upright gaze to improve posture and balance, and increasing stride length and step height to decrease risk for falls. Stand step transfer to bed with CGA and cues for sequencing. Pt performs sit to supine with leg lifter and no additional assistance, though PT does provide cues for use of momentum. Left supine with alarm intact and all needs within reach.  Therapy Documentation Precautions:  Precautions Precautions: Fall, Other (comment) Precaution Comments: monitor SpO2 to maintain >92% (up to 2L of O2 via nasal cannula as needed), hx of seizure disorder, no R LE ROM restrictions Restrictions Weight Bearing Restrictions: No RLE Weight Bearing: Weight bearing as tolerated   Therapy/Group: Individual Therapy  Breck Coons, PT, DPT 09/29/2022, 4:37 PM

## 2022-09-30 LAB — COMPREHENSIVE METABOLIC PANEL
ALT: 12 U/L (ref 0–44)
AST: 18 U/L (ref 15–41)
Albumin: 2.7 g/dL — ABNORMAL LOW (ref 3.5–5.0)
Alkaline Phosphatase: 155 U/L — ABNORMAL HIGH (ref 38–126)
Anion gap: 10 (ref 5–15)
BUN: 43 mg/dL — ABNORMAL HIGH (ref 8–23)
CO2: 26 mmol/L (ref 22–32)
Calcium: 7.9 mg/dL — ABNORMAL LOW (ref 8.9–10.3)
Chloride: 106 mmol/L (ref 98–111)
Creatinine, Ser: 1.96 mg/dL — ABNORMAL HIGH (ref 0.44–1.00)
GFR, Estimated: 25 mL/min — ABNORMAL LOW (ref >60)
Glucose, Bld: 144 mg/dL — ABNORMAL HIGH (ref 70–99)
Potassium: 4.7 mmol/L (ref 3.5–5.1)
Sodium: 142 mmol/L (ref 135–145)
Total Bilirubin: 0.6 mg/dL (ref 0.3–1.2)
Total Protein: 5.6 g/dL — ABNORMAL LOW (ref 6.5–8.1)

## 2022-09-30 LAB — GLUCOSE, CAPILLARY
Glucose-Capillary: 111 mg/dL — ABNORMAL HIGH (ref 70–99)
Glucose-Capillary: 262 mg/dL — ABNORMAL HIGH (ref 70–99)
Glucose-Capillary: 192 mg/dL — ABNORMAL HIGH (ref 70–99)
Glucose-Capillary: 259 mg/dL — ABNORMAL HIGH (ref 70–99)

## 2022-09-30 LAB — MAGNESIUM: Magnesium: 2.1 mg/dL (ref 1.7–2.4)

## 2022-09-30 MED ORDER — INSULIN GLARGINE-YFGN 100 UNIT/ML ~~LOC~~ SOLN
3.0000 [IU] | Freq: Every day | SUBCUTANEOUS | Status: DC
  Administered 2022-09-30: 3 [IU] via SUBCUTANEOUS
  Filled 2022-09-30 (×3): qty 0.03

## 2022-09-30 MED ORDER — VALSARTAN 80 MG PO TABS: 80.0000 mg | ORAL_TABLET | Freq: Every day | ORAL | 0 refills | Status: DC

## 2022-09-30 MED ORDER — PSYLLIUM 95 % PO PACK: 1.0000 | PACK | Freq: Every day | ORAL | Status: DC

## 2022-09-30 MED ORDER — LEVETIRACETAM 500 MG PO TABS: 500.0000 mg | ORAL_TABLET | Freq: Two times a day (BID) | ORAL | 0 refills | Status: DC

## 2022-09-30 MED ORDER — ISOSORBIDE MONONITRATE ER 30 MG PO TB24: 30.0000 mg | ORAL_TABLET | Freq: Every day | ORAL | 0 refills | Status: AC

## 2022-09-30 MED ORDER — VITAMIN D (ERGOCALCIFEROL) 1.25 MG (50000 UNIT) PO CAPS: 50000.0000 [IU] | ORAL_CAPSULE | ORAL | 0 refills | Status: DC

## 2022-09-30 MED ORDER — ATORVASTATIN CALCIUM 40 MG PO TABS: 40.0000 mg | ORAL_TABLET | Freq: Every day | ORAL | 0 refills | Status: AC

## 2022-09-30 MED ORDER — TORSEMIDE 20 MG PO TABS: 40.0000 mg | ORAL_TABLET | Freq: Every day | ORAL | 0 refills | Status: DC

## 2022-09-30 MED ORDER — POLYETHYLENE GLYCOL 3350 17 G PO PACK: 17.0000 g | PACK | Freq: Every day | ORAL | 0 refills | Status: AC

## 2022-09-30 MED ORDER — CLOPIDOGREL BISULFATE 75 MG PO TABS: 75.0000 mg | ORAL_TABLET | Freq: Every evening | ORAL | 0 refills | Status: AC

## 2022-09-30 MED ORDER — ASCORBIC ACID 1000 MG PO TABS: 1000.0000 mg | ORAL_TABLET | Freq: Every day | ORAL | 0 refills | Status: DC

## 2022-09-30 MED ORDER — METHOCARBAMOL 500 MG PO TABS: 500.0000 mg | ORAL_TABLET | ORAL | 0 refills | Status: AC | PRN

## 2022-09-30 MED ORDER — LIDOCAINE 5 % EX PTCH: 1.0000 | MEDICATED_PATCH | CUTANEOUS | 0 refills | Status: DC

## 2022-09-30 MED ORDER — APIXABAN 2.5 MG PO TABS: 2.5000 mg | ORAL_TABLET | Freq: Two times a day (BID) | ORAL | 0 refills | Status: AC

## 2022-09-30 MED ORDER — SODIUM BICARBONATE 650 MG PO TABS: 650.0000 mg | ORAL_TABLET | Freq: Two times a day (BID) | ORAL | 0 refills | Status: DC

## 2022-09-30 MED ORDER — ADULT MULTIVITAMIN W/MINERALS CH: 1.0000 | ORAL_TABLET | Freq: Every day | ORAL | Status: AC

## 2022-09-30 MED ORDER — MELATONIN 3 MG PO TABS: 3.0000 mg | ORAL_TABLET | Freq: Every day | ORAL | 0 refills | Status: AC

## 2022-09-30 MED ORDER — DOCUSATE SODIUM 100 MG PO CAPS: 100.0000 mg | ORAL_CAPSULE | Freq: Two times a day (BID) | ORAL | 0 refills | Status: AC

## 2022-09-30 MED ORDER — LORAZEPAM 0.5 MG PO TABS: 0.5000 mg | ORAL_TABLET | Freq: Four times a day (QID) | ORAL | 0 refills | Status: DC | PRN

## 2022-09-30 MED ORDER — FLUTICASONE FUROATE-VILANTEROL 200-25 MCG/ACT IN AEPB: 1.0000 | INHALATION_SPRAY | RESPIRATORY_TRACT | 0 refills | Status: AC | PRN

## 2022-09-30 MED ORDER — OXYCODONE HCL 5 MG PO TABS: 5.0000 mg | ORAL_TABLET | ORAL | 0 refills | Status: DC | PRN

## 2022-09-30 MED ORDER — METOPROLOL SUCCINATE ER 25 MG PO TB24: 12.5000 mg | ORAL_TABLET | Freq: Every day | ORAL | 0 refills | Status: AC

## 2022-09-30 MED ORDER — EMPAGLIFLOZIN 25 MG PO TABS: 25.0000 mg | ORAL_TABLET | Freq: Every day | ORAL | 0 refills | Status: DC

## 2022-09-30 NOTE — Progress Notes (Addendum)
Patient ID: [name], female   DOB: Jul 12, 1941, 81 y.o.   MRN: [id number]   Sw recived notification from [facility] with worker's comp), requesting assistance with HH.  SW sent out referral to:  [facility]- patient declined [facility]- declined, oes not work with worker's comp. [facility]- declined, does not work with worker's comp. [facility]- declined, does not work with worker's comp. [facility]- declined, does not work with worker's comp. [facility]- declined, due to worker's comp [name]- Patient decline due to [employer] comp  Sw informed [name]

## 2022-09-30 NOTE — Progress Notes (Signed)
Patient being DC home 10/25. Patient educated on removing throw rugs, follow up with PCP and assigned provided rechecks. Reposition every two hours while awake. Continue to monitor BS, and maintain a  low carb diet. Patient has no questions or concerns about being DC tomorrow.

## 2022-09-30 NOTE — Progress Notes (Signed)
Occupational Therapy Session Note  Patient Details  Name: [name] MRN: [id number] Date of Birth: 1941/06/09  Today's Date: 09/30/2022 OT Individual Time: 1105-1200 OT Individual Time Calculation (min): 55 min    Short Term Goals: Week 3:  OT Short Term Goal 1 (Week 3): STG = LTG due to ELOS  Skilled Therapeutic Interventions/Progress Updates:  Skilled OT intervention completed with focus on self-care needs, functional ambulation in prep for home management, BUE exercises. Pt received seated in w/c, no pain indicated. Pt reported she walked 140 ft yesterday and felt really proud of herself regarding her progress. Therapist offered hair washing (due to chemo port and inability to shower) as a celebratory self-care task prior to discharge, with pt participating in task for UB endurance.   Total A for washing hair at sink side with use of hair washing tray, then pt utilized hair dryer to groom hair with slight SOB demo but pt self-initiating controlled breathing and rest techniques to continue task.  Ambulated 120 ft with supervision using RW from room > gym with w/c follow for fatigue.  Seated EOM, pt completed the following exercises to promote strength for BUE dependence on RW during functional mobility: With 4 pound dowel -Chest presses, bicep flexion, overhead presses (x10)  Sit > stand with mod I and stand pivot to w/c with mod I using RW. Transported back to room, with therapist positioning pt at [facility] in bathroom in prep for toileting needs, with direct care hand off to NT due to therapist time constraint. All needs met at end of session.   Therapy Documentation Precautions:  Precautions Precautions: Fall, Other (comment) Precaution Comments: monitor SpO2 to maintain >92%, hx of seizure disorder, no R LE ROM restrictions Restrictions Weight Bearing Restrictions: Yes RLE Weight Bearing: Weight bearing as tolerated    Therapy/Group: Individual Therapy  Blase Mess, MS,  OTR/L  09/30/2022, 12:04 PM

## 2022-09-30 NOTE — Progress Notes (Signed)
Pt's blood pressure was 151/131 '@2042'$  due to anxiety from needing a breathing treatment.  Pt's vitals rechecked and pt had calmed down by that time.  Blood pressure reading now is 144/44.  Will recheck '@0000'$ .

## 2022-09-30 NOTE — Progress Notes (Signed)
Patient ID: [name], female   DOB: 18-Jan-1941, 81 y.o.   MRN: [id number]  Patient will require a Duoneb nebulizer. Information sent to worker's comp.

## 2022-09-30 NOTE — Progress Notes (Signed)
Occupational Therapy Session Note  Patient Details  Name: [name] MRN: [id number] Date of Birth: 1940/12/13  Today's Date: 09/30/2022 OT Individual Time: 1415-1530 OT Individual Time Calculation (min): 75 min    Short Term Goals: Week 3:  OT Short Term Goal 1 (Week 3): STG = LTG due to ELOS  Skilled Therapeutic Interventions/Progress Updates:     Pt received resting in wc upon OT arrival motivated to participate in therapy and excited to be returning home tomorrow. Pt propelled self in wc to therapy gym with supervision.  Therapeutic Activity: Pt completed therapeutic activities in standing at elevated table with supervision. Pt completed 30 piece puzzle with min A maintaining upright posture and problem solving throughout task. Pt provided short rest break and then returned to standing to complete small peg board activity. Pt copied pattern from picture with 100% accuracy and SBA. Pt was able to remove pegs from peg board with tweezers with SBA. Pt provided short rest break and returning to standing with RW at elevated table to complete game of connect 4. Pt attended to task and place small pieces into slots with smooth movements and accurate coordination.  Therapeutic exercise: Pt was educated on simple UE exercises she could complete at home using water bottles or soup cans and demonstrated good teach back. Pt completed 2x10 reps of bicep curls, chest press, and overhead press using 2# weighted bar while seated in her wc. Pt propelled self back to her room.  Self Care: Pt ambulated to the bathroom with supervision using her RW. Pt toileted (continent void of urine) and completed 3/3 toileitng tasks with supervision. Pt ambulated back to her bed sit>supine using leg lifter supervision. Shortness of breath noted following return to bed d/t decreased activity tolerance, but normal breathing rate returned within a minute and upon elevating HOB. Pt was left resting in her bed with bed alarm on,  call bell in reach and all needs met.   Therapy Documentation Precautions:  Precautions Precautions: Fall, Other (comment) Precaution Comments: monitor SpO2 to maintain >92%, hx of seizure disorder, no R LE ROM restrictions Restrictions Weight Bearing Restrictions: No RLE Weight Bearing: Weight bearing as tolerated   Vital Signs: Therapy Vitals Temp: 98.1 F (36.7 C) Pulse Rate: 72 Resp: 15 BP: (Abnormal) 151/54 Patient Position (if appropriate): Sitting Oxygen Therapy SpO2: 96 % O2 Device: Room Air Pain: 0/10 Pain Assessment Pain Score: 3  ADL: ADL Equipment Provided: Long-handled sponge, Leg lifter Eating: Independent Where Assessed-Eating: Wheelchair Grooming: Modified independent Where Assessed-Grooming: Sitting at sink Upper Body Bathing: Modified independent Where Assessed-Upper Body Bathing: Sitting at sink Lower Body Bathing: Supervision/safety Where Assessed-Lower Body Bathing: Sitting at sink, Standing at sink Upper Body Dressing: Modified independent (Device) Where Assessed-Upper Body Dressing: Wheelchair Lower Body Dressing: Supervision/safety Where Assessed-Lower Body Dressing: Sitting at sink, Standing at sink Toileting: Supervision/safety Where Assessed-Toileting: Bedside Commode Toilet Transfer: Close supervision Toilet Transfer Method: Counselling psychologist: Extra wide bedside commode, Grab bars Tub/Shower Transfer: Minimal assistance Tub/Shower Transfer Method: Sit pivot, Ambulating Tub/Shower Equipment: Grab bars, Facilities manager: Curator Method: Ambulating, Sit pivot Youth worker: Transfer tub bench, Grab bars   Therapy/Group: Individual Therapy  [name] 09/30/2022, 4:32 PM

## 2022-09-30 NOTE — Progress Notes (Signed)
PROGRESS NOTE   Subjective/Complaints: Complains of labial swelling, denies pain, area examined and no significant swelling noted Discussed increased creatinine    ROS:    Pt denies SOB, abd pain, CP, and vision changes, +right hip pain, +constipation well controlled currently with medication   Objective:   No results found. No results for input(s): "WBC", "HGB", "HCT", "PLT" in the last 72 hours.   Recent Labs    09/30/22 0701  NA 142  K 4.7  CL 106  CO2 26  GLUCOSE 144*  BUN 43*  CREATININE 1.96*  CALCIUM 7.9*      Intake/Output Summary (Last 24 hours) at 09/30/2022 1125 Last data filed at 09/30/2022 0808 Gross per 24 hour  Intake 476 ml  Output --  Net 476 ml        Physical Exam: Vital Signs Blood pressure (!) 135/51, pulse 66, temperature 98.5 F (36.9 C), resp. rate 18, weight 78.6 kg, SpO2 96 %. General: awake, alert, appropriate, BMI 31.69; sitting up in bed; NAD HENT: conjugate gaze; oropharynx moist [id number] bradycardic rate; no JVD Pulmonary: CTA B/L; no W/R/R- good air movement GI: soft, NT, ND, (+)BS Psychiatric: appropriate Neurological: Ox3   No clubbing, cyanosis. 2+ b/l LE edema. Pulses are 2+ Psych: Pleasant and cheerful most of the time. Pt is cooperative Neuro:  Alert and oriented x4,follows commands, CN 2-12 grossly intact Skin: Clean and intact without signs of breakdown R hip with 2 border foam dressings- CDI Sensation decreased to LT in b/l LE in stocking glove distribution, normal insight and judgement Strength 5/5 in b/l UE Strength 5/5 in LLE Strength 1-2/5 hip flexion and knee extension, 4/5 ankle PF and DF  FTN intact b/l Musculoskeletal:  Normal bulk and tone.  R hip appropriately tender, ecchymosis along R thigh Bilateral lower extremity 1-2 edema, compression garments in place Ambulating with RW CG TTP low back Functional mobility: Can come to edge of  bed independently Sit to stand CG Transferring with RW CG GU: total A for pericare, continent, labia examined and no significant swelling noted  Assessment/Plan: 1. Functional deficits which require 3+ hours per day of interdisciplinary therapy in a comprehensive inpatient rehab setting. Physiatrist is providing close team supervision and 24 hour management of active medical problems listed below. Physiatrist and rehab team continue to assess barriers to discharge/monitor patient progress toward functional and medical goals  Care Tool:  Bathing    Body parts bathed by patient: Right arm, Left arm, Chest, Abdomen, Face, Front perineal area, Buttocks, Right upper leg, Left upper leg, Right lower leg, Left lower leg   Body parts bathed by helper: Front perineal area, Buttocks, Right upper leg, Left upper leg, Right lower leg, Left lower leg     Bathing assist Assist Level: Contact Guard/Touching assist     Upper Body Dressing/Undressing Upper body dressing   What is the patient wearing?: Pull over shirt    Upper body assist Assist Level: Set up assist    Lower Body Dressing/Undressing Lower body dressing      What is the patient wearing?: Incontinence brief, Pants     Lower body assist Assist for lower body dressing: Supervision/Verbal cueing  Toileting Toileting    Toileting assist Assist for toileting: Independent with assistive device     Transfers Chair/bed transfer  Transfers assist     Chair/bed transfer assist level: Supervision/Verbal cueing Chair/bed transfer assistive device: Programmer, multimedia   Ambulation assist      Assist level: Supervision/Verbal cueing Assistive device: Walker-rolling Max distance: 100   Walk 10 feet activity   Assist  Walk 10 feet activity did not occur: Safety/medical concerns  Assist level: Supervision/Verbal cueing Assistive device: Walker-rolling   Walk 50 feet activity   Assist Walk 50 feet  with 2 turns activity did not occur: Safety/medical concerns  Assist level: Supervision/Verbal cueing Assistive device: Walker-rolling    Walk 150 feet activity   Assist Walk 150 feet activity did not occur: Safety/medical concerns         Walk 10 feet on uneven surface  activity   Assist Walk 10 feet on uneven surfaces activity did not occur: Safety/medical concerns   Assist level: Supervision/Verbal cueing Assistive device: Walker-rolling   Wheelchair     Assist Is the patient using a wheelchair?: Yes Type of Wheelchair: Manual    Wheelchair assist level: Supervision/Verbal cueing Max wheelchair distance: 100    Wheelchair 50 feet with 2 turns activity    Assist        Assist Level: Supervision/Verbal cueing   Wheelchair 150 feet activity     Assist      Assist Level: Supervision/Verbal cueing   Blood pressure (!) 135/51, pulse 66, temperature 98.5 F (36.9 C), resp. rate 18, weight 78.6 kg, SpO2 96 %.  Medical Problem List and Plan: 1. Functional deficits secondary to right intertrochanteric hip fracture after mechanical fall.  Status post intramedullary nailing 08/30/2022.  Weightbearing as tolerated, no ROM restrictions              -patient may shower, soap and water only             -ELOS/Goals: 14 days             -Sutures removed 09/12/22             Continue CIR- PT and OT  Reviewed therapy notes  Discussed with patient her preference for home versus SNF 2.  Atrial fibrillation: continue Eliquis, toprol XL restarted, continue metoprolol to 12.'5mg'$  HS             -antiplatelet therapy: Plavix 75 mg daily             - RLE duplex ordered 9/30 for pain, edema post-op - negative 3. Pain Management: continue Robaxin and oxycodone as needed. Increase Robaxin frequency to q4H prn. Ordered ice to hip three times per day for 15 minutes each. Increase scheduled tylenol to 1,'0000mg'$  TID- changed start time to 5am. Schedule oxycodone '10mg'$  daily at  7:30am prior to therapy. Start vitamin C supplement to help with pain sensitivity. Provided with pain relief journal. Added oxycodone 2.'5mg'$  q4H prn for mild pain  10/21- a little sore this AM- con't regimen 4. Anxiety: continue Ativan 0.5 mg every 6 hours as needed anxiety.  Provide emotional support             -antipsychotic agents: N/A 5. Neuropsych/cognition: This patient is capable of making decisions on her own behalf. 6. Skin/Wound Care: Routine skin checks 7. Fluids/Electrolytes/Nutrition: Routine in and outs with follow-up chemistries 8.  Acute blood loss anemia. Hgb down to 7.6, monitor weekly 9.  AKI on CKD stage III.  Baseline creatinine 1.5-1.9.  Latest follow-up creatinine 1.80.  Follow-up renal services as needed.  Renal ultrasound negative. Currently at baseline, weight stable, will decrease torsemide back to '20mg'$  daily. Monitor labs weekly. Repeat creatinine tomorrow given increase 10.  Diabetes mellitus.  Sliding scale insulin. Check blood sugars before meals and at bedtime. Monitor closely as PO intake improves. Recommended avoiding added sugars and minimizing bread, pasta, and rice in her diet. Decrease CBG checks to AC and d/c HS sliding scale. D/c Semglee. Restarted Jardiance Recent Labs    09/29/22 1631 09/29/22 2110 09/30/22 0618  GLUCAP 100* 159* 111*   Glucose overall well controlled Decreased ISS to no more than 1U, increase Semglee to 8U  10/21- CBG's controlled- con't regimen 11.  History of seizure disorder.  continue Keppra 500 mg twice daily 12.Myelogenous leukemia.  In remission.  Follow-up outpatient with Dr. [name] 13.  Persistent atrial fibrillation. Discussed with cardiology holding low dose eliquis until XR to assess for hematoma.  Cardiac rate controlled 14.  CAD with stenting.  continue Plavix.  No chest pain or shortness of breath 15.  Chronic diastolic congestive heart failure.  Echocardiogram 04/17/2022 ejection fraction 60 to 65%.  LOSARTAN and  JARDIANCE currently on hold.   Resume as needed. Increase torsemide back to home dose of '40mg'$  daily. Discussed with patient. Weights improved, swelling has improved, discussed with patient Filed Weights   09/28/22 0504 09/29/22 0500 09/30/22 0500  Weight: 78.6 kg 78.6 kg 78.6 kg     16.  History of uterine breast cancer.  Follow-up outpatient. 17.  Hyperlipidemia.  Lipitor '40mg'$  daily 18.  Constipation.  Senokot twice daily, miralax daily. Molasses enema on 10/9 ordered. Encouraged adequate hydration 19.  COPD with remote history of tobacco use.  Duoneb PRN- discussed that we can continue inhaler/nebulizer at home. Check oxygen saturations every shift. Goal sat is 88-92%, adjusted order. Discussed that she will not need O2 at home.  20.  Acute urinary retention.  improved, continue Flomax.   21. Indigestion             -Maalox prn 22. Low Vitamin D             -continue Ergocalciferol 50,000 units every 7 days for 7 doses. 23. Pancreas head mass. follows with Dr. Ardis Hughs 24. Obesity BMI 31.73-->32.09: provided dietary eduction. Increase torsemide given increase in weight.  25. 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  26. HTN: toprol decreased back to 12.'5mg'$  given low pressures, improved control, fluctuates, continue to monitor TID 27. UTI: d/c Jardiance, start keflex, UC shows >100,000 E coli and Klebsiella-Both sensitive to cefazolin 28. Hemorrhoid: non-painful, discussed this occurs due to straining form constipation, see#[id number] 29. Perceived labial swelling: examined area and no significant swelling noted     LOS: 25 days A FACE TO FACE EVALUATION WAS PERFORMED  [name] P [name] 09/30/2022, 11:25 AM

## 2022-09-30 NOTE — Discharge Summary (Signed)
Physical Therapy Discharge Summary  Patient Details  Name: [name] MRN: [id number] Date of Birth: 1941-11-12  Date of Discharge from PT service:September 30, 2022  Today's Date: 09/30/2022 PT Individual Time: 0903-1000 PT Individual Time Calculation (min): 57 min    Patient has met 7 of 8 long term goals due to improved activity tolerance, improved balance, improved postural control, increased strength, increased range of motion, decreased pain, ability to compensate for deficits, and functional use of  right lower extremity.  Patient to discharge at an ambulatory level using RW with Supervision.   Patient's care partner is independent to provide the necessary physical assistance at discharge.  Reasons goals not met: Pt requires min assist to manage R LE in/out of the car simulator; however, in pt's personal vehicle she may not require this assistance due to different set-up for LEs.  Recommendation:  Patient will benefit from ongoing skilled PT services in home health setting to continue to advance safe functional mobility, address ongoing impairments in R LE strength and ROM, pain management, R LE WBing tolerance, dynamic standing balance, gait training using LRAD, and minimize fall risk.  Equipment:  20x16 manual WC with bilateral elevating legrests and brake extendors, RW, 20x16 Roho cushion, bed assist rail  Reasons for discharge: treatment goals met and discharge from hospital  Patient/family agrees with progress made and goals achieved: Yes  Skilled Therapeutic Interventions/Progress Updates:  Pt received supine in bed and agreeable to therapy session. MD in/out for morning assessment. Pt reports she is having increased soreness in R LE after increased walking distance during therapy yesterday. Supine>sitting L EOB, HOB flat but using bedrail per home set-up, mod-I. Donned B LE thigh high TED hose min assist for time management, donned tennis shoes max assist for time.    Sit>stand EOB>RW mod-I. R stand pivot EOB>w/c using RW mod-I.  Transported to/from gym in w/c for time management and energy conservation.  Gait training ~65ft using RW with close supervision for safety - continues to demo very slow gait speed with decreased R LE hip/knee flexion during swing phase causing poor foot clearance as well as decreased R LE WBing during stance and pt often off-weighting it with B UE support on AD - cuing for improved upright posture and decreased WBing through UEs.  Pt reports her main concern is getting on/off the porch to enter her home. Therapist provided visual demonstration and education on proper technique/sequencing to step on/off 1 (6" height) step using RW. Pt navigated on/off 1 (6" height) step x2 using RW with CGA progressed to close supervision - leading with L LE on ascent and R LE on descent. SpO2 92% afterward.  Pt denies any additional questions/concerns regarding D/C. Therapist educated pt on recommendation to start with HHPT follow-up and importance of performing her HEP as provided by her primary PT.  Participated in Timed Up and Go (TUG): 1st trial: 20min5sec seconds 2nd trial: 56 seconds  Patient demonstrates high fall risk as indicated by requiring >13.5seconds to complete the TUG. Educated pt on results.  Transported back to her room. Pt requesting to return to bed for pain management. L stand pivot w/c>EOB using RW mod-I. Sit>supine, HOB flat using bedrail and leg lifter, mod-I. Pt left supine in bed, HOB elevated, with needs in reach and bed alarm on.   PT Discharge Precautions/Restrictions Precautions Precautions: Fall;Other (comment) Precaution Comments: monitor SpO2 to maintain >92%, hx of seizure disorder, no R LE ROM restrictions Restrictions Weight Bearing Restrictions: Yes RLE Weight  Bearing: Weight bearing as tolerated Pain Pain Assessment Pain Scale: 0-10 (reports 0/10 pain at rest) Pain Score: 7  Pain Type: Acute  pain;Surgical pain Pain Location: Hip Pain Orientation: Right Pain Descriptors / Indicators: Aching Pain Onset: With Activity Pain Intervention(s): Medication (See eMAR);Rest;Repositioned;Relaxation;Distraction Pain Interference Pain Interference Pain Effect on Sleep: 3. Frequently ("wakes me up a couple times a night.") Pain Interference with Therapy Activities: 3. Frequently Pain Interference with Day-to-Day Activities: 3. Frequently Vision/Perception  Vision - History Ability to See in Adequate Light: 0 Adequate Perception Perception: Within Functional Limits Praxis Praxis: Intact  Cognition Overall Cognitive Status: Within Functional Limits for tasks assessed Arousal/Alertness: Awake/alert Orientation Level: Oriented X4 Day of Week: Correct Attention: Focused;Sustained;Selective Focused Attention: Appears intact Sustained Attention: Appears intact Selective Attention: Appears intact Memory: Appears intact Awareness: Appears intact Problem Solving: Appears intact Safety/Judgment: Appears intact Sensation Sensation Light Touch: Appears Intact Hot/Cold: Not tested Proprioception: Appears Intact Stereognosis: Not tested Coordination Gross Motor Movements are Fluid and Coordinated: No Coordination and Movement Description: continues to be mildly impaired due to R LE pain and weakness with generalized deconditioning Motor  Motor Motor: Other (comment) Motor - Discharge Observations: impaired due to R LE pain and weakness as well as generalized deconditioning; however, improved significantly since initial eval  Mobility  Bed Mobility Bed Mobility: Sit to Supine;Supine to Sit Supine to Sit: Independent with assistive device (using bedrail) Sit to Supine: Independent with assistive device (using leg lifter & bedrail) Transfers Transfers: Sit to Stand;Stand to Sit;Stand Pivot Transfers Sit to Stand: Independent with assistive device Stand to Sit: Independent with  assistive device Stand Pivot Transfers: Independent with assistive device Transfer (Assistive device): Rolling walker Locomotion  Gait Ambulation: Yes Gait Assistance: Supervision/Verbal cueing Gait Distance (Feet): 146 Feet Assistive device: Rolling walker Gait Assistance Details: Verbal cues for precautions/safety;Verbal cues for safe use of DME/AE;Verbal cues for gait pattern Gait Gait: Yes Gait Pattern: Impaired Gait Pattern: Step-through pattern;Decreased stance time - right;Decreased step length - right;Poor foot clearance - right;Trunk flexed;Decreased weight shift to right Gait velocity: significantly decreased Stairs / Additional Locomotion Stairs: Yes Stairs Assistance: Contact Guard/Touching assist (can use RW to step on/off 1 (6" height) step with CGA/supervision) Stair Management Technique: Two rails;Step to pattern;Forwards Number of Stairs: 8 Height of Stairs: 6 Ramp: Supervision/Verbal cueing (using RW) Curb: Contact Guard/Touching assist (using RW) Pick up small object from the floor assist level: Supervision/Verbal cueing Pick up small object from the floor assistive device: using RW and Art gallery manager Mobility: Yes Wheelchair Assistance: Set up Lexicographer: Both upper extremities Wheelchair Parts Management: Needs assistance Distance: 16ft  Trunk/Postural Assessment  Cervical Assessment Cervical Assessment: Exceptions to [facility] (slight forward head) Thoracic Assessment Thoracic Assessment: Exceptions to [facility] (rounded shoulders with mild thoracic kyphosis) Lumbar Assessment Lumbar Assessment: Exceptions to [facility] (flexible posterior pelvic tilt in sitting) Postural Control Postural Control: Within Functional Limits (using B UE support on RW)  Balance Balance Balance Assessed: Yes Standardized Balance Assessment Standardized Balance Assessment: Timed Up and Go Test Timed Up and Go Test TUG: Normal TUG Normal TUG  (seconds): 56 (requires use of RW - 89min5sec on 1st trial) Static Sitting Balance Static Sitting - Balance Support: Feet supported;Bilateral upper extremity supported Static Sitting - Level of Assistance: 6: Modified independent (Device/Increase time) Dynamic Sitting Balance Dynamic Sitting - Balance Support: Feet supported;Bilateral upper extremity supported Dynamic Sitting - Level of Assistance: 6: Modified independent (Device/Increase time) Static Standing Balance Static Standing - Balance Support: During functional activity;Bilateral  upper extremity supported Static Standing - Level of Assistance: 5: Stand by assistance Dynamic Standing Balance Dynamic Standing - Balance Support: During functional activity;Bilateral upper extremity supported Dynamic Standing - Level of Assistance: 5: Stand by assistance Extremity Assessment      RLE Assessment RLE Assessment: Exceptions to [facility] Active Range of Motion (AROM) Comments: limited due to pain General Strength Comments: assessed in sitting - pt reports pain with resistance applied for strength assessment RLE Strength Right Hip Flexion: 2+/5 Right Knee Flexion: 3+/5 (pain started after min resistance applied) Right Knee Extension: 3+/5 (pain started after min resistance applied) Right Ankle Dorsiflexion: 4-/5 Right Ankle Plantar Flexion: 4-/5 LLE Assessment LLE Assessment: Exceptions to [facility] Active Range of Motion (AROM) Comments: WFL General Strength Comments: assessed in sitting LLE Strength Left Hip Flexion: 4-/5 Left Knee Flexion: 4/5 Left Knee Extension: 4+/5 Left Ankle Dorsiflexion: 4+/5 Left Ankle Plantar Flexion: 4+/5   [name] , PT, DPT, NCS, CSRS 09/30/2022, 7:48 AM

## 2022-10-01 ENCOUNTER — Other Ambulatory Visit: Payer: Self-pay

## 2022-10-01 ENCOUNTER — Telehealth: Payer: Self-pay

## 2022-10-01 DIAGNOSIS — I10 Essential (primary) hypertension: Secondary | ICD-10-CM

## 2022-10-01 DIAGNOSIS — I25118 Atherosclerotic heart disease of native coronary artery with other forms of angina pectoris: Secondary | ICD-10-CM

## 2022-10-01 DIAGNOSIS — S72144S Nondisplaced intertrochanteric fracture of right femur, sequela: Secondary | ICD-10-CM

## 2022-10-01 LAB — BASIC METABOLIC PANEL
Anion gap: 10 (ref 5–15)
BUN: 42 mg/dL — ABNORMAL HIGH (ref 8–23)
CO2: 28 mmol/L (ref 22–32)
Calcium: 8 mg/dL — ABNORMAL LOW (ref 8.9–10.3)
Chloride: 106 mmol/L (ref 98–111)
Creatinine, Ser: 1.91 mg/dL — ABNORMAL HIGH (ref 0.44–1.00)
GFR, Estimated: 26 mL/min — ABNORMAL LOW (ref >60)
Glucose, Bld: 154 mg/dL — ABNORMAL HIGH (ref 70–99)
Potassium: 5 mmol/L (ref 3.5–5.1)
Sodium: 144 mmol/L (ref 135–145)

## 2022-10-01 LAB — GLUCOSE, CAPILLARY: Glucose-Capillary: 134 mg/dL — ABNORMAL HIGH (ref 70–99)

## 2022-10-01 NOTE — Progress Notes (Signed)
Patient ID: [name], female   DOB: 19-May-1941, 81 y.o.   MRN: [id number]  Sw informed that patient nebulizer would discharge to patient's home tomorrow or Friday. Physician informed.   Worker's comp able to obtain PT, still in process of obtaining OT. Physician informed.   Patient reports she is ready for discharge, just tired today.

## 2022-10-01 NOTE — Progress Notes (Signed)
Patient being DC today. Patient educated on nebulizer machine and  TX's. No further questions from the patient at this time.

## 2022-10-01 NOTE — Telephone Encounter (Signed)
Pharmacy faxed over refill request for Flutic/Vilan inhaler, Levetiracetam, Isosorbide, Clopidogrel, & Atorvastatin.  Per discharge summary patient is follow up if needed. Requested refills faxed to PCP.

## 2022-10-01 NOTE — Patient Outreach (Signed)
[name] 1941-02-22 [id number]   Post inpatient rehab follow up care coordination for high risk patient  Primary Care Provider: Isaac Bliss, Rayford Halsted, MD  Insurance: [facility] Medicare  Diabetes management Hip fracture  [name], RN BSN [place]  [phone] business mobile phone Toll free office [phone]  *[place]  [phone] Fax number: [phone] [name]

## 2022-10-01 NOTE — Progress Notes (Signed)
Inpatient Rehabilitation Care Coordinator Discharge Note   Patient Details  Name: [name] MRN: [id number] Date of Birth: [birth year]   Discharge location: Home  Length of Stay:    Discharge activity level: CGA/Min  Home/community participation: daughter  Patient response PX:TGGYIR Literacy - How often do you need to have someone help you when you read instructions, pamphlets, or other written material from your doctor or pharmacy?: Never  Patient response SW:NIOEVO Isolation - How often do you feel lonely or isolated from those around you?: Never  Services provided included: SW, Neuropsych, Pharmacy, TR, CM, RN, SLP, OT, PT, RD, MD  Financial Services:  Financial Services Utilized: Other (Comment)    Choices offered to/list presented to: Worker's COMP  Follow-up services arranged:  Home Health           Patient response to transportation need: Is the patient able to respond to transportation needs?: Yes In the past 12 months, has lack of transportation kept you from medical appointments or from getting medications?: No In the past 12 months, has lack of transportation kept you from meetings, work, or from getting things needed for daily living?: No    Comments (or additional information):  Patient/Family verbalized understanding of follow-up arrangements:  Yes  Individual responsible for coordination of the follow-up plan: self or daughter  Confirmed correct DME delivered: Dyanne Iha 10/01/2022    Dyanne Iha

## 2022-10-01 NOTE — Progress Notes (Signed)
Recreational Therapy Discharge Summary Patient Details  Name: [name] MRN: [id number] Date of Birth: March 26, 1941 Today's Date: 10/01/2022   Comments on progress toward goals: Pt has made good progress during LOS and is discharging home today at supervision level.  TR sessions focused on leisure education, activity analysis/modifications & stress management.  Pt is anxious to return home with hopes of returning to work in the future.  Reasons for discharge: discharge from hospital  Follow-up: [place] agrees with progress made and goals achieved: Yes  Jaxston Chohan 10/01/2022, 1:14 PM

## 2022-10-01 NOTE — Progress Notes (Signed)
Inpatient Rehabilitation Discharge Medication Review by a Pharmacist  A complete drug regimen review was completed for this patient to identify any potential clinically significant medication issues.  High Risk Drug Classes Is patient taking? Indication by Medication  Antipsychotic No   Anticoagulant Yes Apixaban-afib  Antibiotic No   Opioid Yes Oxycodone-pain  Antiplatelet Yes Clopidogrel-CAD  Hypoglycemics/insulin Yes Empagliflozin, Toujeo-antihyperglycemia  Vasoactive Medication Yes Isosorbide, valsartan, metoprolol, torsemide-HTN/CAD  Chemotherapy Yes Letrazole-h/o breast cancer  Other Yes Ascorbic acid- deficiency Docusate-constipation Lorazepam-Anxiety Melatonin-sleep Methocarbamol-spasms Sodium bicarb-acidosis Atorvastatin-HLD Creon-pancreatitis Breo-asthma/COPD Keppra-Seizure disorder Miralax-constipation Ergocalciferol-vitamin D deficiency     Type of Medication Issue Identified Description of Issue Recommendation(s)  Drug Interaction(s) (clinically significant)     Duplicate Therapy     Allergy     No Medication Administration End Date     Incorrect Dose     Additional Drug Therapy Needed     Significant med changes from prior encounter (inform family/care partners about these prior to discharge).    Other       Clinically significant medication issues were identified that warrant physician communication and completion of prescribed/recommended actions by midnight of the next day:  No  Name of provider notified for urgent issues identified:   Provider Method of Notification:     Pharmacist comments:   Time spent performing this drug regimen review (minutes):  20   Marrietta Thunder A. Levada Dy, PharmD, BCPS, FNKF Clinical Pharmacist [place] Please utilize [facility] for appropriate phone number to reach the unit pharmacist ([place])  09/30/2022 12:44 PM

## 2022-10-01 NOTE — Progress Notes (Signed)
PROGRESS NOTE   Subjective/Complaints: No new complaints this morning She is on the commode She asks whether her medications can be delivered here    ROS:    Pt denies SOB, abd pain, CP, and vision changes, +right hip pain-improved, +constipation well controlled currently with medication   Objective:   No results found. No results for input(s): "WBC", "HGB", "HCT", "PLT" in the last 72 hours.   Recent Labs    09/30/22 0701 10/01/22 0636  NA 142 144  K 4.7 5.0  CL 106 106  CO2 26 28  GLUCOSE 144* 154*  BUN 43* 42*  CREATININE 1.96* 1.91*  CALCIUM 7.9* 8.0*      Intake/Output Summary (Last 24 hours) at 10/01/2022 0926 Last data filed at 10/01/2022 0719 Gross per 24 hour  Intake 944 ml  Output --  Net 944 ml        Physical Exam: Vital Signs Blood pressure (!) 144/54, pulse 70, temperature 97.8 F (36.6 C), resp. rate 18, weight 78.1 kg, SpO2 95 %. General: awake, alert, appropriate, BMI 31.48; sitting up in bed; NAD HENT: conjugate gaze; oropharynx moist [id number] bradycardic rate; no JVD Pulmonary: CTA B/L; no W/R/R- good air movement GI: soft, NT, ND, (+)BS Psychiatric: appropriate Neurological: Ox3   No clubbing, cyanosis. 2+ b/l LE edema. Pulses are 2+ Psych: Pleasant and cheerful most of the time. Pt is cooperative Neuro:  Alert and oriented x4,follows commands, CN 2-12 grossly intact Skin: Clean and intact without signs of breakdown R hip with 2 border foam dressings- CDI Sensation decreased to LT in b/l LE in stocking glove distribution, normal insight and judgement Strength 5/5 in b/l UE Strength 5/5 in LLE Strength 3/5 hip flexion and knee extension, 4/5 ankle PF and DF  FTN intact b/l Musculoskeletal:  Normal bulk and tone.  R hip appropriately tender, ecchymosis along R thigh Bilateral lower extremity 1-2 edema, compression garments in place Ambulating with RW supervision TTP  low back Functional mobility: Can come to edge of bed independently Sit to stand CG Transferring with RW CG GU: total A for pericare, continent, labia examined and no significant swelling noted  Assessment/Plan: 1. Functional deficits which require 3+ hours per day of interdisciplinary therapy in a comprehensive inpatient rehab setting. Physiatrist is providing close team supervision and 24 hour management of active medical problems listed below. Physiatrist and rehab team continue to assess barriers to discharge/monitor patient progress toward functional and medical goals  Care Tool:  Bathing    Body parts bathed by patient: Right arm, Left arm, Chest, Abdomen, Face, Front perineal area, Buttocks, Right upper leg, Left upper leg, Right lower leg, Left lower leg   Body parts bathed by helper: Front perineal area, Buttocks, Right upper leg, Left upper leg, Right lower leg, Left lower leg     Bathing assist Assist Level: Supervision/Verbal cueing     Upper Body Dressing/Undressing Upper body dressing   What is the patient wearing?: Pull over shirt    Upper body assist Assist Level: Independent with assistive device    Lower Body Dressing/Undressing Lower body dressing      What is the patient wearing?: Incontinence brief, Pants  Lower body assist Assist for lower body dressing: Supervision/Verbal cueing (with reacher and LH shoe horn and sock aid)     Toileting Toileting    Toileting assist Assist for toileting: Independent with assistive device     Transfers Chair/bed transfer  Transfers assist     Chair/bed transfer assist level: Independent with assistive device Chair/bed transfer assistive device: Museum/gallery exhibitions officer assist      Assist level: Supervision/Verbal cueing Assistive device: Walker-rolling Max distance: 155f   Walk 10 feet activity   Assist  Walk 10 feet activity did not occur: Safety/medical  concerns  Assist level: Supervision/Verbal cueing Assistive device: Walker-rolling   Walk 50 feet activity   Assist Walk 50 feet with 2 turns activity did not occur: Safety/medical concerns  Assist level: Supervision/Verbal cueing Assistive device: Walker-rolling    Walk 150 feet activity   Assist Walk 150 feet activity did not occur: Safety/medical concerns (pain limited)         Walk 10 feet on uneven surface  activity   Assist Walk 10 feet on uneven surfaces activity did not occur: Safety/medical concerns   Assist level: Supervision/Verbal cueing Assistive device: Walker-rolling   Wheelchair     Assist Is the patient using a wheelchair?: Yes Type of Wheelchair: Manual    Wheelchair assist level: Set up assist, Supervision/Verbal cueing Max wheelchair distance: 1542f   Wheelchair 50 feet with 2 turns activity    Assist        Assist Level: Supervision/Verbal cueing   Wheelchair 150 feet activity     Assist      Assist Level: Supervision/Verbal cueing   Blood pressure (!) 144/54, pulse 70, temperature 97.8 F (36.6 C), resp. rate 18, weight 78.1 kg, SpO2 95 %.  Medical Problem List and Plan: 1. Functional deficits secondary to right intertrochanteric hip fracture after mechanical fall.  Status post intramedullary nailing 08/30/2022.  Weightbearing as tolerated, no ROM restrictions              -patient may shower, soap and water only             -ELOS/Goals: 14 days             -Sutures removed 09/12/22             d/c home today  Reviewed therapy notes  Discussed with patient her preference for home versus SNF 2.  Atrial fibrillation: continue Eliquis, toprol XL restarted, continue metoprolol to 12.'5mg'$  HS             -antiplatelet therapy: Plavix 75 mg daily             - RLE duplex ordered 9/30 for pain, edema post-op - negative 3. Pain Management: continue Robaxin and oxycodone as needed. Increase Robaxin frequency to q4H prn.  Ordered ice to hip three times per day for 15 minutes each. Increase scheduled tylenol to 1,'0000mg'$  TID- changed start time to 5am. Schedule oxycodone '10mg'$  daily at 7:30am prior to therapy. Start vitamin C supplement to help with pain sensitivity. Provided with pain relief journal. Added oxycodone 2.'5mg'$  q4H prn for mild pain  10/21- a little sore this AM- con't regimen 4. Anxiety: continue Ativan 0.5 mg every 6 hours as needed anxiety.  Provide emotional support             -antipsychotic agents: N/A 5. Neuropsych/cognition: This patient is capable of making decisions on her own behalf. 6. Skin/Wound Care: Routine skin checks  7. Fluids/Electrolytes/Nutrition: Routine in and outs with follow-up chemistries 8.  Acute blood loss anemia. Hgb down to 7.6, monitor weekly 9.  AKI on CKD stage III.  Baseline creatinine 1.5-1.9.  Latest follow-up creatinine 1.80.  Follow-up renal services as needed.  Renal ultrasound negative. Currently at baseline, weight stable, will decrease torsemide back to '20mg'$  daily. Monitor labs weekly. Repeat creatinine tomorrow given increase 10.  Diabetes mellitus.  Sliding scale insulin. Check blood sugars before meals and at bedtime. Monitor closely as PO intake improves. Recommended avoiding added sugars and minimizing bread, pasta, and rice in her diet. Decrease CBG checks to AC and d/c HS sliding scale. D/c Semglee. Restarted Jardiance Recent Labs    09/30/22 1642 09/30/22 2149 10/01/22 0649  GLUCAP 192* 259* 134*   Glucose overall well controlled Add back Semglee 3U daily  10/21- CBG's controlled- con't regimen 11.  History of seizure disorder.  continue Keppra 500 mg twice daily 12.Myelogenous leukemia.  In remission.  Follow-up outpatient with Dr. [name] 13.  Persistent atrial fibrillation. Discussed with cardiology holding low dose eliquis until XR to assess for hematoma.  Cardiac rate controlled 14.  CAD with stenting.  continue Plavix.  No chest pain or shortness  of breath 15.  Chronic diastolic congestive heart failure.  Echocardiogram 04/17/2022 ejection fraction 60 to 65%.  LOSARTAN and JARDIANCE currently on hold.   Resume as needed. Increase torsemide back to home dose of '40mg'$  daily. Discussed with patient. Weights improved, swelling has improved, discussed with patient Filed Weights   09/29/22 0500 09/30/22 0500 10/01/22 0500  Weight: 78.6 kg 78.6 kg 78.1 kg     16.  History of uterine breast cancer.  Follow-up outpatient. 17.  Hyperlipidemia.  Lipitor '40mg'$  daily 18.  Constipation.  Senokot twice daily, miralax daily. Molasses enema on 10/9 ordered. Encouraged adequate hydration 19.  COPD with remote history of tobacco use.  Duoneb PRN- discussed that we can continue inhaler/nebulizer at home. Check oxygen saturations every shift. Goal sat is 88-92%, adjusted order. Discussed that she will not need O2 at home.  20.  Acute urinary retention.  improved, continue Flomax.   21. Indigestion             -Maalox prn 22. Low Vitamin D             -continue Ergocalciferol 50,000 units every 7 days for 7 doses. 23. Pancreas head mass. follows with Dr. Ardis Hughs 24. Obesity BMI 31.73-->32.09: provided dietary eduction. Increase torsemide given increase in weight.  25. 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  26. HTN: toprol decreased back to 12.'5mg'$  given low pressures, improved control, fluctuates, continue to monitor TID 27. UTI: d/c Jardiance, start keflex, UC shows >100,000 E coli and Klebsiella-Both sensitive to cefazolin 28. Hemorrhoid: non-painful, discussed this occurs due to straining form constipation, see#[id number] 29. Perceived labial swelling: examined area and no significant swelling noted     LOS: 26 days A FACE TO FACE EVALUATION WAS PERFORMED  [name] P Lino Wickliff 10/01/2022, 9:26 AM

## 2022-10-02 ENCOUNTER — Encounter: Payer: Self-pay | Admitting: Physical Medicine and Rehabilitation

## 2022-10-02 ENCOUNTER — Telehealth: Payer: Self-pay

## 2022-10-02 ENCOUNTER — Telehealth: Payer: Self-pay | Admitting: *Deleted

## 2022-10-02 NOTE — Telephone Encounter (Signed)
PA for Lidocaine 5% patch entered in Cover My Meds today.

## 2022-10-02 NOTE — Chronic Care Management (AMB) (Signed)
  Care Coordination   Note   10/02/2022 Name: [name] MRN: [id number] DOB: 07-21-1941  [name] is a 81 y.o. year old female who sees Isaac Bliss, Rayford Halsted, MD for primary care. I reached out to [name] by phone today to offer care coordination services.  Referral received   [name] was given information about Care Coordination services today including:   The Care Coordination services include support from the care team which includes your Nurse Coordinator, Clinical Social Worker, or Pharmacist.  The Care Coordination team is here to help remove barriers to the health concerns and goals most important to you. Care Coordination services are voluntary, and the patient may decline or stop services at any time by request to their care team member.   Care Coordination Consent Status: Patient agreed to services and verbal consent obtained.   Follow up plan:  Telephone appointment with care coordination team member scheduled for:  10/03/2022  Encounter Outcome:  Pt. Scheduled from referral   Julian Hy, [place] Direct Dial: [phone]

## 2022-10-02 NOTE — Telephone Encounter (Signed)
Transition Care Management Follow-up Telephone Call Date of discharge and from where: [name] 10-01-22 Dx: Intertrochanteric fracture of right hip How have you been since you were released from the hospital? Feeling ok but sore  Any questions or concerns? no  Items Reviewed: Did the pt receive and understand the discharge instructions provided? Yes  Medications obtained and verified? Yes  Other? No  Any new allergies since your discharge? No  Dietary orders reviewed? Yes Do you have support at home? Yes   Home Care and Equipment/Supplies: Were home health services ordered? Yes- PT If so, what is the name of the agency? Unsure of name  Has the agency set up a time to come to the patient's home? no Were any new equipment or medical supplies ordered?  Yes: walker What is the name of the medical supply agency? Hospital  Were you able to get the supplies/equipment? yes Do you have any questions related to the use of the equipment or supplies? No  Functional Questionnaire: (I = Independent and D = Dependent) ADLs: I- WITH ASSISTANCE   Bathing/Dressing- I-WITH ASSISTANCE   Meal Prep- D  Eating- I  Maintaining continence- I  Transferring/Ambulation- I-WALKER  Managing Meds- I  Follow up appointments reviewed:  PCP Hospital f/u appt confirmed? Yes- Dr Deniece Ree 10-14-22 at 130pm. [facility] f/u appt confirmed? No  Pt aware to make fu appt with orthopaedic surgeon . Are transportation arrangements needed? No  If their condition worsens, is the pt aware to call PCP or go to the Emergency Dept.? Yes Was the patient provided with contact information for the PCP's office or ED? Yes Was to pt encouraged to call back with questions or concerns? Yes   Juanda Crumble LPN [place] Direct Dial [phone]

## 2022-10-03 ENCOUNTER — Telehealth (INDEPENDENT_AMBULATORY_CARE_PROVIDER_SITE_OTHER): Payer: Medicare HMO | Admitting: Family Medicine

## 2022-10-03 ENCOUNTER — Telehealth: Payer: Self-pay | Admitting: Internal Medicine

## 2022-10-03 ENCOUNTER — Ambulatory Visit: Payer: Self-pay | Admitting: *Deleted

## 2022-10-03 ENCOUNTER — Encounter: Payer: Self-pay | Admitting: Family Medicine

## 2022-10-03 ENCOUNTER — Encounter: Payer: Self-pay | Admitting: *Deleted

## 2022-10-03 DIAGNOSIS — J441 Chronic obstructive pulmonary disease with (acute) exacerbation: Secondary | ICD-10-CM | POA: Diagnosis not present

## 2022-10-03 MED ORDER — AZITHROMYCIN 250 MG PO TABS: ORAL_TABLET | ORAL | 0 refills | Status: AC

## 2022-10-03 NOTE — Patient Outreach (Signed)
  Care Coordination   Initial Visit Note   10/03/2022 Name: [name] MRN: [id number] DOB: Dec 23, 1940  GLENDA SPELMAN is a 81 y.o. year old female who sees Isaac Bliss, Rayford Halsted, MD for primary care. I spoke with  [name] by phone today.  What matters to the patients health and wellness today?  "Don't need anything"    Goals Addressed               This Visit's Progress     COMPLETED: "I don't need anything right now" (pt-stated)        Care Coordination Interventions: Advised patient of importance of notifying provider of falls Assessed for signs and symptoms of orthostatic hypotension Assessed for falls since last encounter Assessed patients knowledge of fall risk prevention secondary to previously provided education Screening for signs and symptoms of depression related to chronic disease state  Assessed social determinant of health barriers Verbal education provided for fall prevention  Offered information for possible fall alert system in the home via wrist band or necklace to push button for emergency services (declined). Heart Failure interventions Discussed the importance of keeping all appointments with provider Pt unable to weight strongly encouraged pt to recognize any ongoing swelling to her LE and/or any increase in her SOB related to fluid retention to contact her provider's office for interventions however if acute seek immediately medical care to prevent hospitalization. Pt aware to elevate her LE with any signs of swelling. Reviewed all medications reviewed with no needed refills or issues. Reviewed all scheduled appointments as pt has sufficient transportation. RN offered care management services for ongoing interventions and prevention measures however pt opt to declined all services at this time.         SDOH assessments and interventions completed:  Yes  SDOH Interventions Today    Flowsheet Row Most Recent Value  SDOH Interventions    Food Insecurity Interventions Intervention Not Indicated  Housing Interventions Intervention Not Indicated  Transportation Interventions Intervention Not Indicated  Utilities Interventions Intervention Not Indicated        Care Coordination Interventions Activated:  Yes  Care Coordination Interventions:  Yes, provided   Follow up plan: No further intervention required.   Encounter Outcome:  Pt. Visit Completed   Raina Mina, RN Care Management Coordinator [place] Office [phone]

## 2022-10-03 NOTE — Progress Notes (Signed)
Patient ID: [name], female   DOB: June 03, 1941, 81 y.o.   MRN: [id number]   Virtual Visit via Telephone Note  I connected with [name] on 10/03/22 at  3:00 PM EDT by telephone and verified that I am speaking with the correct person using two identifiers.   I discussed the limitations, risks, security and privacy concerns of performing an evaluation and management service by telephone and the availability of in person appointments. I also discussed with the patient that there may be a patient responsible charge related to this service. The patient expressed understanding and agreed to proceed.  Location patient: home Location provider: work or home office Participants present for the call: patient, provider Patient did not have a visit in the prior 7 days to address this/these issue(s).   History of Present Illness: [name] is an 81 year old female with multiple chronic problems including history of COPD, type 2 diabetes, hypertension, chronic kidney disease.  She had hip fracture back in September which required surgery and was followed by approximately 1 month rehab stay.  She developed yesterday some cough now productive of green sputum.  She is concerned about developing pneumonia.  No fever yet.  She does have COPD.  She feels like she has some occasional wheezing.  She has home nebulizer and using DuoNeb with that.  She has Breo inhaler but not using regularly.  She does have type 2 diabetes.  She states that when she has had similar flareups in the past Zithromax has worked well.  She denies any nausea or vomiting.  Past Medical History:  Diagnosis Date   Acute myelogenous leukemia ([place]) 02/2014   Acute pancreatitis    Arthritis    "back" (09/17/2017)   Atrial flutter ([place])    Cardiomyopathy ([place])    Chronic kidney disease, stage II (mild)    Chronic lower back pain    Colon polyps 04/29/2010   TUBULAR ADENOMA AND A SERRATED ADENOMA   COPD (chronic obstructive pulmonary  disease) ([place])    CORONARY ARTERY DISEASE 12/24/2007   DIABETES MELLITUS, TYPE II 07/13/2007   History of blood transfusion 2015   "related to leukemia"   History of kidney stones 12/24/2007   History of uterine cancer    HYPERLIPIDEMIA 12/24/2007   HYPERTENSION 07/13/2007   NSTEMI (non-ST elevated myocardial infarction) ([place]) 09/17/2017   Uterine cancer ([place])    Past Surgical History:  Procedure Laterality Date   ABDOMINAL HYSTERECTOMY     "still have my ovaries"   BREAST BIOPSY Left 09/22/2019   BREAST LUMPECTOMY Left 10/24/2019   BREAST LUMPECTOMY WITH RADIOACTIVE SEED LOCALIZATION Left 10/24/2019   Procedure: LEFT BREAST LUMPECTOMY WITH RADIOACTIVE SEED LOCALIZATION;  Surgeon: Jovita Kussmaul, MD;  Location: [place] [facility];  Service: General;  Laterality: Left;   CARDIAC CATHETERIZATION  2002   CARDIAC CATHETERIZATION  09/17/2017   CATARACT EXTRACTION W/ INTRAOCULAR LENS  IMPLANT, BILATERAL Bilateral    COLONOSCOPY W/ BIOPSIES AND POLYPECTOMY  2011   CORONARY STENT INTERVENTION N/A 09/21/2017   Procedure: CORONARY STENT INTERVENTION;  Surgeon: Troy Sine, MD;  Location: [facility];  Service: Cardiovascular;  Laterality: N/A;   DILATION AND CURETTAGE OF UTERUS     ESOPHAGOGASTRODUODENOSCOPY N/A 01/30/2022   Procedure: ESOPHAGOGASTRODUODENOSCOPY (EGD);  Surgeon: Milus Banister, MD;  Location: [facility];  Service: Endoscopy;  Laterality: N/A;   EUS N/A 01/30/2022   Procedure: UPPER ENDOSCOPIC ULTRASOUND (EUS) RADIAL;  Surgeon: Milus Banister, MD;  Location: [facility];  Service: Endoscopy;  Laterality: N/A;   EXCISIONAL HEMORRHOIDECTOMY  X 2   FINE NEEDLE ASPIRATION N/A 01/30/2022   Procedure: FINE NEEDLE ASPIRATION (FNA) LINEAR;  Surgeon: Milus Banister, MD;  Location: [facility];  Service: Endoscopy;  Laterality: N/A;   GANGLION CYST EXCISION Left X 3   INTRAMEDULLARY (IM) NAIL INTERTROCHANTERIC Right 08/30/2022   Procedure: INTRAMEDULLARY (IM)  NAIL INTERTROCHANTERIC;  Surgeon: Altamese [place], MD;  Location: [place] [facility];  Service: Orthopedics;  Laterality: Right;   INTRAVASCULAR PRESSURE WIRE/FFR STUDY N/A 09/17/2017   Procedure: INTRAVASCULAR PRESSURE WIRE/FFR STUDY;  Surgeon: Nelva Bush, MD;  Location: [facility];  Service: Cardiovascular;  Laterality: N/A;   NASAL SINUS SURGERY     PORTA CATH INSERTION Right 2013   RIGHT/LEFT HEART CATH AND CORONARY ANGIOGRAPHY N/A 09/17/2017   Procedure: RIGHT/LEFT HEART CATH AND CORONARY ANGIOGRAPHY;  Surgeon: Nelva Bush, MD;  Location: [facility];  Service: Cardiovascular;  Laterality: N/A;   TUBAL LIGATION      reports that she quit smoking about 21 years ago. Her smoking use included cigarettes. She has a 20.00 pack-year smoking history. She has never used smokeless tobacco. She reports that she does not drink alcohol and does not use drugs. family history includes Breast cancer (age of onset: 68) in her sister; COPD in her father; Cancer in her maternal uncle, niece, paternal aunt, and paternal uncle; Cervical cancer in her sister; Cirrhosis in her maternal uncle; Congestive Heart Failure in her mother; Esophageal cancer in her brother and father; Heart attack in her maternal grandfather, maternal grandmother, and paternal grandfather; Leukemia in her cousin; Lung cancer in her sister, sister, and sister; Skin cancer in her daughter and maternal grandfather; Stroke (age of onset: 27) in her paternal grandmother; Suicidality in her brother. No Known Allergies    Observations/Objective: Patient sounds cheerful and well on the phone. I do not appreciate any SOB. Speech and thought processing are grossly intact. Patient reported vitals:  Assessment and Plan:  Acute exacerbation of COPD.  Patient is in no respiratory distress currently.  -Continue nebulizer at home as needed every 6 hours -Start Zithromax.  She has tolerated this well in the past.  We did explain that if  she had any progressive symptoms or specially fever to be in touch -We did discuss the fact that she may need a brief course of prednisone but will try the antibiotics first.  Follow Up Instructions:   99441 5-10 [id number] 11-20 [id number] 21-30 I did not refer this patient for an OV in the next 24 hours for this/these issue(s).  I discussed the assessment and treatment plan with the patient. The patient was provided an opportunity to ask questions and all were answered. The patient agreed with the plan and demonstrated an understanding of the instructions.   The patient was advised to call back or seek an in-person evaluation if the symptoms worsen or if the condition fails to improve as anticipated.  I provided 17 minutes of non-face-to-face time during this encounter.   Carolann Littler, MD

## 2022-10-03 NOTE — Telephone Encounter (Signed)
Patient coughing up green phlegm started yesterday, and getting worse. Asking if someone would call in an antibiotic, does not want it to turn into pneumonia. Declined OV as she was just discharged from hospital on 10/01/22 and not able to travel.

## 2022-10-03 NOTE — Telephone Encounter (Signed)
2:00pm-Spoke with the patient and informed her a visit is needed for evaluation.  Appt scheduled with Dr Elease Hashimoto as PCP is out of the office.

## 2022-10-03 NOTE — Patient Instructions (Signed)
Visit Information  Thank you for taking time to visit with me today. Please don't hesitate to contact me if I can be of assistance to you.   Following are the goals we discussed today:   Goals Addressed               This Visit's Progress     COMPLETED: "I don't need anything right now" (pt-stated)        Care Coordination Interventions: Advised patient of importance of notifying provider of falls Assessed for signs and symptoms of orthostatic hypotension Assessed for falls since last encounter Assessed patients knowledge of fall risk prevention secondary to previously provided education Screening for signs and symptoms of depression related to chronic disease state  Assessed social determinant of health barriers Verbal education provided for fall prevention  Offered information for possible fall alert system in the home via wrist band or necklace to push button for emergency services (declined). Heart Failure interventions Discussed the importance of keeping all appointments with provider Pt unable to weight strongly encouraged pt to recognize any ongoing swelling to her LE and/or any increase in her SOB related to fluid retention to contact her provider's office for interventions however if acute seek immediately medical care to prevent hospitalization. Pt aware to elevate her LE with any signs of swelling. Reviewed all medications reviewed with no needed refills or issues. Reviewed all scheduled appointments as pt has sufficient transportation. RN offered care management services for ongoing interventions and prevention measures however pt opt to declined all services at this time.         Please call the care guide team at [phone] if you need to cancel or reschedule your appointment.   If you are experiencing a Mental Health or [place] or need someone to talk to, please call the Suicide and Crisis Lifeline: 988  The patient verbalized understanding of  instructions, educational materials, and care plan provided today and DECLINED offer to receive copy of patient instructions, educational materials, and care plan.   No further follow up required: No needs  Raina Mina, RN Care Management Coordinator [place] Office [phone]

## 2022-10-06 ENCOUNTER — Telehealth: Payer: Self-pay

## 2022-10-06 NOTE — Telephone Encounter (Signed)
PA for Lidocaine patches approved good until12/31/2024

## 2022-10-11 ENCOUNTER — Other Ambulatory Visit: Payer: Self-pay | Admitting: Physical Medicine and Rehabilitation

## 2022-10-13 ENCOUNTER — Telehealth: Payer: Self-pay | Admitting: Physical Medicine and Rehabilitation

## 2022-10-13 ENCOUNTER — Telehealth: Payer: Self-pay | Admitting: Cardiovascular Disease

## 2022-10-13 DIAGNOSIS — S72101A Unspecified trochanteric fracture of right femur, initial encounter for closed fracture: Secondary | ICD-10-CM

## 2022-10-13 NOTE — Telephone Encounter (Signed)
Orders placed for OT and PT and faxed to the number given.

## 2022-10-13 NOTE — Addendum Note (Signed)
Addended by: Caro Hight on: 10/13/2022 01:35 PM   Modules accepted: Orders

## 2022-10-13 NOTE — Telephone Encounter (Signed)
[name] with [employer] needs an order to start outpatient therapy.  Please fax this order to [phone] or if you have any questions please call her at [phone] ext [phone].

## 2022-10-13 NOTE — Telephone Encounter (Signed)
Pt c/o swelling: STAT is pt has developed SOB within 24 hours  How much weight have you gained and in what time span? 5 lbs in one week   If swelling, where is the swelling located? Both legs and right arm   Are you currently taking a fluid pill? Yes   Are you currently SOB? Yes   Do you have a log of your daily weights (if so, list)? No   Have you gained 3 pounds in a day or 5 pounds in a week? 5 lbs in a week   Have you traveled recently? No

## 2022-10-14 ENCOUNTER — Ambulatory Visit: Payer: Medicare HMO | Admitting: Internal Medicine

## 2022-10-14 NOTE — Telephone Encounter (Signed)
Called patient, but no answer. Left message asking that she call office back at her earliest convenience.  Pt seen in hospital on 09/05/22 (stay lasted 26 days) and discharged on 10/01/22 with instructions per the AVS to call cardiology for appt.  Cannot see where appt was made. Chart shows that patient takes Torsemide '40mg'$  daily. Last creatinine on 10/01/22=1.91.  Will await return call and offer her first available appt.

## 2022-10-14 NOTE — Telephone Encounter (Signed)
Opened in error

## 2022-10-15 ENCOUNTER — Telehealth: Payer: Self-pay | Admitting: Cardiovascular Disease

## 2022-10-15 MED ORDER — NITROGLYCERIN 0.4 MG SL SUBL: 0.4000 mg | SUBLINGUAL_TABLET | SUBLINGUAL | 11 refills | Status: DC | PRN

## 2022-10-15 NOTE — Telephone Encounter (Signed)
*  STAT* If patient is at the pharmacy, call can be transferred to refill team.   1. Which medications need to be refilled? (please list name of each medication and dose if known) nitroGLYCERIN (NITROSTAT) 0.4 MG SL tablet   2. Which pharmacy/location (including street and city if local pharmacy) is medication to be sent to? [facility] [phone] - [place], [place] - [address] AT [place]   3. Do they need a 30 day or 90 day supply? 30 day

## 2022-10-15 NOTE — Telephone Encounter (Signed)
Pt's medication was sent to pt's pharmacy as requested. Confirmation received.  °

## 2022-10-16 ENCOUNTER — Inpatient Hospital Stay (HOSPITAL_COMMUNITY): Payer: Medicare HMO

## 2022-10-16 ENCOUNTER — Inpatient Hospital Stay (HOSPITAL_COMMUNITY)
Admission: EM | Admit: 2022-10-16 | Discharge: 2022-10-29 | DRG: 291 | Disposition: A | Discharge status: Home Health | Payer: Medicare HMO | Attending: Internal Medicine | Admitting: Internal Medicine

## 2022-10-16 ENCOUNTER — Emergency Department (HOSPITAL_COMMUNITY): Payer: Medicare HMO

## 2022-10-16 ENCOUNTER — Other Ambulatory Visit: Payer: Self-pay

## 2022-10-16 ENCOUNTER — Encounter (HOSPITAL_COMMUNITY): Payer: Self-pay | Admitting: Emergency Medicine

## 2022-10-16 DIAGNOSIS — Z9841 Cataract extraction status, right eye: Secondary | ICD-10-CM

## 2022-10-16 DIAGNOSIS — I252 Old myocardial infarction: Secondary | ICD-10-CM

## 2022-10-16 DIAGNOSIS — J918 Pleural effusion in other conditions classified elsewhere: Secondary | ICD-10-CM | POA: Diagnosis present

## 2022-10-16 DIAGNOSIS — R569 Unspecified convulsions: Secondary | ICD-10-CM

## 2022-10-16 DIAGNOSIS — I4819 Other persistent atrial fibrillation: Secondary | ICD-10-CM | POA: Diagnosis present

## 2022-10-16 DIAGNOSIS — C9201 Acute myeloblastic leukemia, in remission: Secondary | ICD-10-CM | POA: Diagnosis present

## 2022-10-16 DIAGNOSIS — I5033 Acute on chronic diastolic (congestive) heart failure: Secondary | ICD-10-CM | POA: Diagnosis not present

## 2022-10-16 DIAGNOSIS — I10 Essential (primary) hypertension: Secondary | ICD-10-CM | POA: Diagnosis not present

## 2022-10-16 DIAGNOSIS — Z7901 Long term (current) use of anticoagulants: Secondary | ICD-10-CM

## 2022-10-16 DIAGNOSIS — G894 Chronic pain syndrome: Secondary | ICD-10-CM | POA: Diagnosis present

## 2022-10-16 DIAGNOSIS — I5020 Unspecified systolic (congestive) heart failure: Secondary | ICD-10-CM | POA: Diagnosis not present

## 2022-10-16 DIAGNOSIS — J449 Chronic obstructive pulmonary disease, unspecified: Secondary | ICD-10-CM | POA: Diagnosis present

## 2022-10-16 DIAGNOSIS — I5042 Chronic combined systolic (congestive) and diastolic (congestive) heart failure: Secondary | ICD-10-CM

## 2022-10-16 DIAGNOSIS — N179 Acute kidney failure, unspecified: Secondary | ICD-10-CM | POA: Diagnosis present

## 2022-10-16 DIAGNOSIS — G8929 Other chronic pain: Secondary | ICD-10-CM | POA: Diagnosis present

## 2022-10-16 DIAGNOSIS — I2510 Atherosclerotic heart disease of native coronary artery without angina pectoris: Secondary | ICD-10-CM | POA: Diagnosis not present

## 2022-10-16 DIAGNOSIS — E669 Obesity, unspecified: Secondary | ICD-10-CM | POA: Diagnosis present

## 2022-10-16 DIAGNOSIS — J90 Pleural effusion, not elsewhere classified: Secondary | ICD-10-CM

## 2022-10-16 DIAGNOSIS — I350 Nonrheumatic aortic (valve) stenosis: Secondary | ICD-10-CM | POA: Diagnosis present

## 2022-10-16 DIAGNOSIS — Z170 Estrogen receptor positive status [ER+]: Secondary | ICD-10-CM

## 2022-10-16 DIAGNOSIS — Z79899 Other long term (current) drug therapy: Secondary | ICD-10-CM

## 2022-10-16 DIAGNOSIS — I509 Heart failure, unspecified: Secondary | ICD-10-CM | POA: Diagnosis not present

## 2022-10-16 DIAGNOSIS — I130 Hypertensive heart and chronic kidney disease with heart failure and stage 1 through stage 4 chronic kidney disease, or unspecified chronic kidney disease: Principal | ICD-10-CM | POA: Diagnosis present

## 2022-10-16 DIAGNOSIS — I480 Paroxysmal atrial fibrillation: Secondary | ICD-10-CM | POA: Diagnosis present

## 2022-10-16 DIAGNOSIS — E1165 Type 2 diabetes mellitus with hyperglycemia: Secondary | ICD-10-CM | POA: Diagnosis present

## 2022-10-16 DIAGNOSIS — R8271 Bacteriuria: Secondary | ICD-10-CM | POA: Diagnosis not present

## 2022-10-16 DIAGNOSIS — E8809 Other disorders of plasma-protein metabolism, not elsewhere classified: Secondary | ICD-10-CM | POA: Diagnosis present

## 2022-10-16 DIAGNOSIS — C50412 Malignant neoplasm of upper-outer quadrant of left female breast: Secondary | ICD-10-CM | POA: Diagnosis present

## 2022-10-16 DIAGNOSIS — I5043 Acute on chronic combined systolic (congestive) and diastolic (congestive) heart failure: Secondary | ICD-10-CM | POA: Diagnosis present

## 2022-10-16 DIAGNOSIS — Z993 Dependence on wheelchair: Secondary | ICD-10-CM

## 2022-10-16 DIAGNOSIS — Z66 Do not resuscitate: Secondary | ICD-10-CM | POA: Diagnosis present

## 2022-10-16 DIAGNOSIS — I25118 Atherosclerotic heart disease of native coronary artery with other forms of angina pectoris: Secondary | ICD-10-CM | POA: Diagnosis not present

## 2022-10-16 DIAGNOSIS — R0600 Dyspnea, unspecified: Secondary | ICD-10-CM | POA: Diagnosis not present

## 2022-10-16 DIAGNOSIS — E1122 Type 2 diabetes mellitus with diabetic chronic kidney disease: Secondary | ICD-10-CM | POA: Diagnosis present

## 2022-10-16 DIAGNOSIS — J439 Emphysema, unspecified: Secondary | ICD-10-CM | POA: Diagnosis not present

## 2022-10-16 DIAGNOSIS — Z955 Presence of coronary angioplasty implant and graft: Secondary | ICD-10-CM

## 2022-10-16 DIAGNOSIS — Z90711 Acquired absence of uterus with remaining cervical stump: Secondary | ICD-10-CM

## 2022-10-16 DIAGNOSIS — G40909 Epilepsy, unspecified, not intractable, without status epilepticus: Secondary | ICD-10-CM | POA: Diagnosis present

## 2022-10-16 DIAGNOSIS — Z961 Presence of intraocular lens: Secondary | ICD-10-CM | POA: Diagnosis present

## 2022-10-16 DIAGNOSIS — N184 Chronic kidney disease, stage 4 (severe): Secondary | ICD-10-CM | POA: Diagnosis present

## 2022-10-16 DIAGNOSIS — Z90710 Acquired absence of both cervix and uterus: Secondary | ICD-10-CM

## 2022-10-16 DIAGNOSIS — Z8542 Personal history of malignant neoplasm of other parts of uterus: Secondary | ICD-10-CM

## 2022-10-16 DIAGNOSIS — J9811 Atelectasis: Secondary | ICD-10-CM | POA: Diagnosis not present

## 2022-10-16 DIAGNOSIS — Z6830 Body mass index (BMI) 30.0-30.9, adult: Secondary | ICD-10-CM

## 2022-10-16 DIAGNOSIS — Z794 Long term (current) use of insulin: Secondary | ICD-10-CM

## 2022-10-16 DIAGNOSIS — Z8719 Personal history of other diseases of the digestive system: Secondary | ICD-10-CM

## 2022-10-16 DIAGNOSIS — Z7902 Long term (current) use of antithrombotics/antiplatelets: Secondary | ICD-10-CM

## 2022-10-16 DIAGNOSIS — K7460 Unspecified cirrhosis of liver: Secondary | ICD-10-CM | POA: Diagnosis present

## 2022-10-16 DIAGNOSIS — Z87442 Personal history of urinary calculi: Secondary | ICD-10-CM

## 2022-10-16 DIAGNOSIS — Z8049 Family history of malignant neoplasm of other genital organs: Secondary | ICD-10-CM

## 2022-10-16 DIAGNOSIS — Z803 Family history of malignant neoplasm of breast: Secondary | ICD-10-CM

## 2022-10-16 DIAGNOSIS — E11649 Type 2 diabetes mellitus with hypoglycemia without coma: Secondary | ICD-10-CM | POA: Diagnosis not present

## 2022-10-16 DIAGNOSIS — Z806 Family history of leukemia: Secondary | ICD-10-CM

## 2022-10-16 DIAGNOSIS — J9621 Acute and chronic respiratory failure with hypoxia: Secondary | ICD-10-CM | POA: Diagnosis present

## 2022-10-16 DIAGNOSIS — Z9842 Cataract extraction status, left eye: Secondary | ICD-10-CM

## 2022-10-16 DIAGNOSIS — Z79811 Long term (current) use of aromatase inhibitors: Secondary | ICD-10-CM

## 2022-10-16 DIAGNOSIS — E785 Hyperlipidemia, unspecified: Secondary | ICD-10-CM | POA: Diagnosis not present

## 2022-10-16 DIAGNOSIS — Z7984 Long term (current) use of oral hypoglycemic drugs: Secondary | ICD-10-CM

## 2022-10-16 DIAGNOSIS — E1169 Type 2 diabetes mellitus with other specified complication: Secondary | ICD-10-CM | POA: Diagnosis not present

## 2022-10-16 DIAGNOSIS — Z801 Family history of malignant neoplasm of trachea, bronchus and lung: Secondary | ICD-10-CM

## 2022-10-16 DIAGNOSIS — Z8249 Family history of ischemic heart disease and other diseases of the circulatory system: Secondary | ICD-10-CM

## 2022-10-16 DIAGNOSIS — R0602 Shortness of breath: Secondary | ICD-10-CM | POA: Diagnosis not present

## 2022-10-16 DIAGNOSIS — Z87891 Personal history of nicotine dependence: Secondary | ICD-10-CM

## 2022-10-16 DIAGNOSIS — Z825 Family history of asthma and other chronic lower respiratory diseases: Secondary | ICD-10-CM

## 2022-10-16 HISTORY — DX: Heart failure, unspecified: I50.9

## 2022-10-16 LAB — ECHOCARDIOGRAM COMPLETE
AR max vel: 0.9 cm2
AV Area VTI: 1.01 cm2
AV Area mean vel: 0.88 cm2
AV Mean grad: 12 mmHg
AV Peak grad: 21.3 mmHg
Ao pk vel: 2.31 m/s
Area-P 1/2: 4.24 cm2
Calc EF: 73.8 %
S' Lateral: 2.9 cm
Single Plane A2C EF: 80.1 %
Single Plane A4C EF: 69.7 %

## 2022-10-16 LAB — CBC WITH DIFFERENTIAL/PLATELET
Abs Immature Granulocytes: 0.01 10*3/uL (ref 0.00–0.07)
Basophils Absolute: 0 10*3/uL (ref 0.0–0.1)
Basophils Relative: 0 %
Eosinophils Absolute: 0.2 10*3/uL (ref 0.0–0.5)
Eosinophils Relative: 4 %
HCT: 28.9 % — ABNORMAL LOW (ref 36.0–46.0)
Hemoglobin: 9.2 g/dL — ABNORMAL LOW (ref 12.0–15.0)
Immature Granulocytes: 0 %
Lymphocytes Relative: 20 %
Lymphs Abs: 1 10*3/uL (ref 0.7–4.0)
MCH: 31.3 pg (ref 26.0–34.0)
MCHC: 31.8 g/dL (ref 30.0–36.0)
MCV: 98.3 fL (ref 80.0–100.0)
Monocytes Absolute: 0.4 10*3/uL (ref 0.1–1.0)
Monocytes Relative: 9 %
Neutro Abs: 3.1 10*3/uL (ref 1.7–7.7)
Neutrophils Relative %: 67 %
Platelets: 136 10*3/uL — ABNORMAL LOW (ref 150–400)
RBC: 2.94 MIL/uL — ABNORMAL LOW (ref 3.87–5.11)
RDW: 15.9 % — ABNORMAL HIGH (ref 11.5–15.5)
WBC: 4.7 10*3/uL (ref 4.0–10.5)
nRBC: 0 % (ref 0.0–0.2)

## 2022-10-16 LAB — HEPATIC FUNCTION PANEL
ALT: 9 U/L (ref 0–44)
AST: 19 U/L (ref 15–41)
Albumin: 2.8 g/dL — ABNORMAL LOW (ref 3.5–5.0)
Alkaline Phosphatase: 116 U/L (ref 38–126)
Bilirubin, Direct: 0.2 mg/dL (ref 0.0–0.2)
Indirect Bilirubin: 0.4 mg/dL (ref 0.3–0.9)
Total Bilirubin: 0.6 mg/dL (ref 0.3–1.2)
Total Protein: 5.6 g/dL — ABNORMAL LOW (ref 6.5–8.1)

## 2022-10-16 LAB — BASIC METABOLIC PANEL
Anion gap: 10 (ref 5–15)
BUN: 78 mg/dL — ABNORMAL HIGH (ref 8–23)
CO2: 19 mmol/L — ABNORMAL LOW (ref 22–32)
Calcium: 6.9 mg/dL — ABNORMAL LOW (ref 8.9–10.3)
Chloride: 111 mmol/L (ref 98–111)
Creatinine, Ser: 3.26 mg/dL — ABNORMAL HIGH (ref 0.44–1.00)
GFR, Estimated: 14 mL/min — ABNORMAL LOW (ref >60)
Glucose, Bld: 90 mg/dL (ref 70–99)
Potassium: 4.4 mmol/L (ref 3.5–5.1)
Sodium: 140 mmol/L (ref 135–145)

## 2022-10-16 LAB — CBG MONITORING, ED: Glucose-Capillary: 60 mg/dL — ABNORMAL LOW (ref 70–99)

## 2022-10-16 LAB — BRAIN NATRIURETIC PEPTIDE: B Natriuretic Peptide: 1198.5 pg/mL — ABNORMAL HIGH (ref 0.0–100.0)

## 2022-10-16 LAB — TROPONIN I (HIGH SENSITIVITY)
Troponin I (High Sensitivity): 10 ng/L (ref <18)
Troponin I (High Sensitivity): 12 ng/L (ref <18)

## 2022-10-16 LAB — GLUCOSE, CAPILLARY: Glucose-Capillary: 135 mg/dL — ABNORMAL HIGH (ref 70–99)

## 2022-10-16 MED ORDER — POLYETHYLENE GLYCOL 3350 17 G PO PACK
17.0000 g | PACK | Freq: Every day | ORAL | Status: DC
  Administered 2022-10-16 – 2022-10-29 (×14): 17 g via ORAL
  Filled 2022-10-16 (×14): qty 1

## 2022-10-16 MED ORDER — APIXABAN 2.5 MG PO TABS
2.5000 mg | ORAL_TABLET | Freq: Two times a day (BID) | ORAL | Status: DC
  Administered 2022-10-16 – 2022-10-29 (×27): 2.5 mg via ORAL
  Filled 2022-10-16 (×28): qty 1

## 2022-10-16 MED ORDER — FUROSEMIDE 10 MG/ML IJ SOLN
40.0000 mg | Freq: Two times a day (BID) | INTRAMUSCULAR | Status: DC
  Administered 2022-10-16 – 2022-10-17 (×2): 40 mg via INTRAVENOUS
  Filled 2022-10-16 (×2): qty 4

## 2022-10-16 MED ORDER — TRAZODONE HCL 50 MG PO TABS
25.0000 mg | ORAL_TABLET | Freq: Every evening | ORAL | Status: DC | PRN
  Administered 2022-10-22 – 2022-10-25 (×4): 25 mg via ORAL
  Filled 2022-10-16 (×4): qty 1

## 2022-10-16 MED ORDER — METOPROLOL SUCCINATE ER 25 MG PO TB24
12.5000 mg | ORAL_TABLET | Freq: Every day | ORAL | Status: DC
  Administered 2022-10-16 – 2022-10-29 (×14): 12.5 mg via ORAL
  Filled 2022-10-16 (×14): qty 1

## 2022-10-16 MED ORDER — LEVETIRACETAM 500 MG PO TABS
500.0000 mg | ORAL_TABLET | Freq: Two times a day (BID) | ORAL | Status: DC
  Administered 2022-10-16 – 2022-10-29 (×27): 500 mg via ORAL
  Filled 2022-10-16 (×27): qty 1

## 2022-10-16 MED ORDER — BISACODYL 5 MG PO TBEC: 5.0000 mg | DELAYED_RELEASE_TABLET | Freq: Every day | ORAL | Status: DC | PRN

## 2022-10-16 MED ORDER — FLUTICASONE FUROATE-VILANTEROL 200-25 MCG/ACT IN AEPB
1.0000 | INHALATION_SPRAY | RESPIRATORY_TRACT | Status: DC | PRN
  Administered 2022-10-19: 1 via RESPIRATORY_TRACT
  Filled 2022-10-16: qty 28

## 2022-10-16 MED ORDER — FUROSEMIDE 10 MG/ML IJ SOLN
40.0000 mg | Freq: Once | INTRAMUSCULAR | Status: AC
  Administered 2022-10-16: 40 mg via INTRAVENOUS
  Filled 2022-10-16: qty 4

## 2022-10-16 MED ORDER — CHLORHEXIDINE GLUCONATE CLOTH 2 % EX PADS
6.0000 | MEDICATED_PAD | Freq: Every day | CUTANEOUS | Status: DC
  Administered 2022-10-17 – 2022-10-29 (×13): 6 via TOPICAL

## 2022-10-16 MED ORDER — SODIUM BICARBONATE 650 MG PO TABS
650.0000 mg | ORAL_TABLET | Freq: Two times a day (BID) | ORAL | Status: DC
  Administered 2022-10-16 – 2022-10-21 (×11): 650 mg via ORAL
  Filled 2022-10-16 (×11): qty 1

## 2022-10-16 MED ORDER — DOCUSATE SODIUM 100 MG PO CAPS
100.0000 mg | ORAL_CAPSULE | Freq: Two times a day (BID) | ORAL | Status: DC
  Administered 2022-10-16 – 2022-10-29 (×27): 100 mg via ORAL
  Filled 2022-10-16 (×27): qty 1

## 2022-10-16 MED ORDER — ATORVASTATIN CALCIUM 40 MG PO TABS
40.0000 mg | ORAL_TABLET | Freq: Every day | ORAL | Status: DC
  Administered 2022-10-16 – 2022-10-29 (×14): 40 mg via ORAL
  Filled 2022-10-16 (×14): qty 1

## 2022-10-16 MED ORDER — ONDANSETRON HCL 4 MG PO TABS: 4.0000 mg | ORAL_TABLET | Freq: Four times a day (QID) | ORAL | Status: DC | PRN

## 2022-10-16 MED ORDER — MELATONIN 3 MG PO TABS
3.0000 mg | ORAL_TABLET | Freq: Every day | ORAL | Status: DC
  Administered 2022-10-16 – 2022-10-28 (×13): 3 mg via ORAL
  Filled 2022-10-16 (×13): qty 1

## 2022-10-16 MED ORDER — ACETAMINOPHEN 325 MG PO TABS
650.0000 mg | ORAL_TABLET | Freq: Four times a day (QID) | ORAL | Status: DC | PRN
  Administered 2022-10-16 – 2022-10-29 (×6): 650 mg via ORAL
  Filled 2022-10-16 (×7): qty 2

## 2022-10-16 MED ORDER — ACETAMINOPHEN 650 MG RE SUPP: 650.0000 mg | Freq: Four times a day (QID) | RECTAL | Status: DC | PRN

## 2022-10-16 MED ORDER — INSULIN GLARGINE (2 UNIT DIAL) 300 UNIT/ML ~~LOC~~ SOPN: 10.0000 [IU] | PEN_INJECTOR | SUBCUTANEOUS | Status: DC

## 2022-10-16 MED ORDER — INSULIN ASPART 100 UNIT/ML IJ SOLN
0.0000 [IU] | Freq: Three times a day (TID) | INTRAMUSCULAR | Status: DC
  Administered 2022-10-16 – 2022-10-18 (×4): 3 [IU] via SUBCUTANEOUS
  Administered 2022-10-18: 2 [IU] via SUBCUTANEOUS
  Administered 2022-10-19: 5 [IU] via SUBCUTANEOUS
  Administered 2022-10-19: 3 [IU] via SUBCUTANEOUS
  Administered 2022-10-20: 5 [IU] via SUBCUTANEOUS
  Administered 2022-10-20: 2 [IU] via SUBCUTANEOUS
  Administered 2022-10-20: 5 [IU] via SUBCUTANEOUS
  Administered 2022-10-21: 11 [IU] via SUBCUTANEOUS
  Administered 2022-10-22: 2 [IU] via SUBCUTANEOUS

## 2022-10-16 MED ORDER — ISOSORBIDE MONONITRATE ER 30 MG PO TB24
30.0000 mg | ORAL_TABLET | Freq: Every day | ORAL | Status: DC
  Administered 2022-10-16: 30 mg via ORAL
  Filled 2022-10-16: qty 1

## 2022-10-16 MED ORDER — LORAZEPAM 0.5 MG PO TABS
0.5000 mg | ORAL_TABLET | Freq: Four times a day (QID) | ORAL | Status: DC | PRN
  Administered 2022-10-17 – 2022-10-26 (×8): 0.5 mg via ORAL
  Filled 2022-10-16 (×8): qty 1

## 2022-10-16 MED ORDER — INSULIN ASPART 100 UNIT/ML IJ SOLN
0.0000 [IU] | Freq: Every day | INTRAMUSCULAR | Status: DC
  Administered 2022-10-17 – 2022-10-19 (×2): 2 [IU] via SUBCUTANEOUS

## 2022-10-16 MED ORDER — HYDRALAZINE HCL 20 MG/ML IJ SOLN: 5.0000 mg | INTRAMUSCULAR | Status: DC | PRN

## 2022-10-16 MED ORDER — METHOCARBAMOL 500 MG PO TABS
500.0000 mg | ORAL_TABLET | ORAL | Status: DC | PRN
  Administered 2022-10-16 – 2022-10-29 (×8): 500 mg via ORAL
  Filled 2022-10-16 (×8): qty 1

## 2022-10-16 MED ORDER — ONDANSETRON HCL 4 MG/2ML IJ SOLN: 4.0000 mg | Freq: Four times a day (QID) | INTRAMUSCULAR | Status: DC | PRN

## 2022-10-16 MED ORDER — PSYLLIUM 95 % PO PACK
1.0000 | PACK | Freq: Every day | ORAL | Status: DC
  Administered 2022-10-17 – 2022-10-29 (×13): 1 via ORAL
  Filled 2022-10-16 (×14): qty 1

## 2022-10-16 MED ORDER — OXYCODONE HCL 5 MG PO TABS
5.0000 mg | ORAL_TABLET | ORAL | Status: DC | PRN
  Administered 2022-10-16 – 2022-10-29 (×44): 5 mg via ORAL
  Filled 2022-10-16 (×44): qty 1

## 2022-10-16 MED ORDER — CLOPIDOGREL BISULFATE 75 MG PO TABS
75.0000 mg | ORAL_TABLET | Freq: Every evening | ORAL | Status: DC
  Administered 2022-10-16 – 2022-10-28 (×13): 75 mg via ORAL
  Filled 2022-10-16 (×13): qty 1

## 2022-10-16 MED ORDER — LETROZOLE 2.5 MG PO TABS
2.5000 mg | ORAL_TABLET | Freq: Every day | ORAL | Status: DC
  Administered 2022-10-16 – 2022-10-29 (×14): 2.5 mg via ORAL
  Filled 2022-10-16 (×14): qty 1

## 2022-10-16 MED ORDER — SODIUM CHLORIDE 0.9% FLUSH
3.0000 mL | Freq: Two times a day (BID) | INTRAVENOUS | Status: DC
  Administered 2022-10-16 – 2022-10-29 (×26): 3 mL via INTRAVENOUS

## 2022-10-16 MED ORDER — FUROSEMIDE 10 MG/ML IJ SOLN
120.0000 mg | Freq: Once | INTRAVENOUS | Status: AC
  Administered 2022-10-16: 120 mg via INTRAVENOUS
  Filled 2022-10-16: qty 10

## 2022-10-16 NOTE — ED Notes (Signed)
Medication requested from pharmacy at this time 

## 2022-10-16 NOTE — H&P (Signed)
History and Physical    Patient: [name] [id number] DOB: 06-Mar-1941 DOA: 10/16/2022 DOS: the patient was seen and examined on 10/16/2022 PCP: Isaac Bliss, Rayford Halsted, MD  Patient coming from: Home - lives with husband; NOK:  [name], [name], [phone]    Chief Complaint: Edema  HPI: [name] is a 81 y.o. female with medical history significant of AML (remote); afib on Eliquis; chronic back pain; COPD; CAD; DM; HTN; HLD; chronic diastolic CHF; uterine/breast cancer; and CAD presenting with edema.  Recent hip fracture, dc on 10/26 from CIR. She was swollen and had trouble lifting her feet because they were too heavy.  She was able to walk about 40 feet at the time of dc but the edema has progressed since she got there.  Her legs feel like they weigh a ton and she is mostly in the wheelchair.  Her arms are also swollen and "will leak water out of them."  Some SOB but not terribly so.  No orthopnea.  No significant PND.  Her husband is able to care for her.       ER Course:  Edema worsening since last admission.  Looks like anasarca, swelling in legs, arms, abdomen.  Tachypnea.  Worsening pleural effusion.  On 2L [place] O2 for comfort/WOB.  Worsened renal function.  Given 1 dose of Lasix.  Bladder scan pending.       Review of Systems: As mentioned in the history of present illness. All other systems reviewed and are negative. Past Medical History:  Diagnosis Date   Acute myelogenous leukemia ([place]) 02/2014   Acute pancreatitis    Arthritis    "back" (09/17/2017)   Atrial flutter (HCC)    Cardiomyopathy (HCC)    CHF (congestive heart failure) (HCC)    Chronic kidney disease, stage II (mild)    Chronic lower back pain    Colon polyps 04/29/2010   TUBULAR ADENOMA AND A SERRATED ADENOMA   COPD (chronic obstructive pulmonary disease) ([place])    CORONARY ARTERY DISEASE 12/24/2007   DIABETES MELLITUS, TYPE II 07/13/2007   History of blood transfusion 2015   "related to  leukemia"   History of kidney stones 12/24/2007   History of uterine cancer    HYPERLIPIDEMIA 12/24/2007   HYPERTENSION 07/13/2007   NSTEMI (non-ST elevated myocardial infarction) ([place]) 09/17/2017   Uterine cancer ([place])    Past Surgical History:  Procedure Laterality Date   ABDOMINAL HYSTERECTOMY     "still have my ovaries"   BREAST BIOPSY Left 09/22/2019   BREAST LUMPECTOMY Left 10/24/2019   BREAST LUMPECTOMY WITH RADIOACTIVE SEED LOCALIZATION Left 10/24/2019   Procedure: LEFT BREAST LUMPECTOMY WITH RADIOACTIVE SEED LOCALIZATION;  Surgeon: Jovita Kussmaul, MD;  Location: [facility];  Service: General;  Laterality: Left;   CARDIAC CATHETERIZATION  2002   CARDIAC CATHETERIZATION  09/17/2017   CATARACT EXTRACTION W/ INTRAOCULAR LENS  IMPLANT, BILATERAL Bilateral    COLONOSCOPY W/ BIOPSIES AND POLYPECTOMY  2011   CORONARY STENT INTERVENTION N/A 09/21/2017   Procedure: CORONARY STENT INTERVENTION;  Surgeon: Troy Sine, MD;  Location: [facility];  Service: Cardiovascular;  Laterality: N/A;   DILATION AND CURETTAGE OF UTERUS     ESOPHAGOGASTRODUODENOSCOPY N/A 01/30/2022   Procedure: ESOPHAGOGASTRODUODENOSCOPY (EGD);  Surgeon: Milus Banister, MD;  Location: [facility];  Service: Endoscopy;  Laterality: N/A;   EUS N/A 01/30/2022   Procedure: UPPER ENDOSCOPIC ULTRASOUND (EUS) RADIAL;  Surgeon: Milus Banister, MD;  Location: [facility];  Service: Endoscopy;  Laterality: N/A;   EXCISIONAL HEMORRHOIDECTOMY  X 2   FINE NEEDLE ASPIRATION N/A 01/30/2022   Procedure: FINE NEEDLE ASPIRATION (FNA) LINEAR;  Surgeon: Milus Banister, MD;  Location: [facility];  Service: Endoscopy;  Laterality: N/A;   GANGLION CYST EXCISION Left X 3   INTRAMEDULLARY (IM) NAIL INTERTROCHANTERIC Right 08/30/2022   Procedure: INTRAMEDULLARY (IM) NAIL INTERTROCHANTERIC;  Surgeon: Altamese [place], MD;  Location: [facility];  Service: Orthopedics;  Laterality: Right;   INTRAVASCULAR PRESSURE  WIRE/FFR STUDY N/A 09/17/2017   Procedure: INTRAVASCULAR PRESSURE WIRE/FFR STUDY;  Surgeon: Nelva Bush, MD;  Location: [facility];  Service: Cardiovascular;  Laterality: N/A;   NASAL SINUS SURGERY     PORTA CATH INSERTION Right 2013   RIGHT/LEFT HEART CATH AND CORONARY ANGIOGRAPHY N/A 09/17/2017   Procedure: RIGHT/LEFT HEART CATH AND CORONARY ANGIOGRAPHY;  Surgeon: Nelva Bush, MD;  Location: [facility];  Service: Cardiovascular;  Laterality: N/A;   TUBAL LIGATION     Social History:  reports that she quit smoking about 21 years ago. Her smoking use included cigarettes. She has a 20.00 pack-year smoking history. She has never used smokeless tobacco. She reports that she does not drink alcohol and does not use drugs.  No Known Allergies  Family History  Problem Relation Age of Onset   COPD Father    Esophageal cancer Father    Breast cancer Sister 40   Lung cancer Sister    Esophageal cancer Brother    Suicidality Brother    Lung cancer Sister    Lung cancer Sister    Cervical cancer Sister    Congestive Heart Failure Mother    Heart attack Maternal Grandmother    Skin cancer Maternal Grandfather    Heart attack Maternal Grandfather    Stroke Paternal Grandmother 37   Heart attack Paternal Grandfather    Cancer Maternal Uncle        unknown type, dx. >65   Cancer Paternal Aunt        unknown type, dx. >50   Cancer Paternal Uncle        unknown type, dx. >50   Cirrhosis Maternal Uncle    Skin cancer Daughter    Leukemia Cousin        maternal first cousin; dx. >50, in remission   Cancer Niece        unknown type behind her ear, dx. 30s/40s   Colon cancer Neg Hx     Prior to Admission medications   Medication Sig Start Date End Date Taking? Authorizing Provider  apixaban (ELIQUIS) 2.5 MG TABS tablet Take 1 tablet (2.5 mg total) by mouth 2 (two) times daily. 09/30/22   Angiulli, Lavon Paganini, PA-C  ascorbic acid (VITAMIN C) 1000 MG tablet Take 1  tablet (1,000 mg total) by mouth daily. 09/30/22   Angiulli, Lavon Paganini, PA-C  atorvastatin (LIPITOR) 40 MG tablet Take 1 tablet (40 mg total) by mouth daily. 09/30/22   Angiulli, Lavon Paganini, PA-C  clopidogrel (PLAVIX) 75 MG tablet Take 1 tablet (75 mg total) by mouth every evening. 09/30/22   Angiulli, Lavon Paganini, PA-C  cyanocobalamin (VITAMIN B12) 1000 MCG/ML injection INJECT 1ML IN THE MUSCLE EVERY 30 DAYS AS DIRECTED 08/28/22   Isaac Bliss, Rayford Halsted, MD  docusate sodium (COLACE) 100 MG capsule Take 1 capsule (100 mg total) by mouth 2 (two) times daily. 09/30/22   Angiulli, Lavon Paganini, PA-C  empagliflozin (JARDIANCE) 25 MG TABS tablet Take 1 tablet (25  mg total) by mouth daily. 09/30/22   Angiulli, Lavon Paganini, PA-C  fluticasone furoate-vilanterol (BREO ELLIPTA) 200-25 MCG/ACT AEPB Inhale 1 puff into the lungs as needed (wheezing SOB). 09/30/22   Angiulli, Lavon Paganini, PA-C  insulin glargine, 2 Unit Dial, (TOUJEO MAX SOLOSTAR) 300 UNIT/ML Solostar Pen Inject 6 Units into the skin at bedtime. 09/05/22   Florencia Reasons, MD  isosorbide mononitrate (IMDUR) 30 MG 24 hr tablet Take 1 tablet (30 mg total) by mouth daily. 09/30/22   Angiulli, Lavon Paganini, PA-C  letrozole [facility]) 2.5 MG tablet Take 1 tablet (2.5 mg total) by mouth daily. 02/14/22   Owens Shark, NP  levETIRAcetam (KEPPRA) 500 MG tablet Take 1 tablet (500 mg total) by mouth 2 (two) times daily. 09/30/22   Angiulli, Lavon Paganini, PA-C  lidocaine (LIDODERM) 5 % Place 1 patch onto the skin daily. Remove & Discard patch within 12 hours or as directed by MD 09/30/22   Shively, Lavon Paganini, PA-C  lipase/protease/amylase (CREON) 36000 UNITS CPEP capsule Take 2 capsules (72,000 Units total) by mouth 3 (three) times daily with meals. Patient taking differently: Take 72,000 Units by mouth as needed. 12/17/21   Esterwood, Amy S, PA-C  LORazepam (ATIVAN) 0.5 MG tablet Take 1 tablet (0.5 mg total) by mouth every 6 (six) hours as needed for anxiety. 09/30/22   Angiulli, Lavon Paganini,  PA-C  melatonin 3 MG TABS tablet Take 1 tablet (3 mg total) by mouth at bedtime. 09/30/22   Angiulli, Lavon Paganini, PA-C  methocarbamol (ROBAXIN) 500 MG tablet Take 1 tablet (500 mg total) by mouth every 4 (four) hours as needed for muscle spasms. 09/30/22   Angiulli, Lavon Paganini, PA-C  metoprolol succinate (TOPROL-XL) 25 MG 24 hr tablet Take 0.5 tablets (12.5 mg total) by mouth daily. Take with or immediately following a meal. 09/30/22   Angiulli, Lavon Paganini, PA-C  Multiple Vitamin (MULTIVITAMIN WITH MINERALS) TABS tablet Take 1 tablet by mouth daily. 09/30/22   Angiulli, Lavon Paganini, PA-C  nitroGLYCERIN (NITROSTAT) 0.4 MG SL tablet Place 1 tablet (0.4 mg total) under the tongue every 5 (five) minutes as needed for chest pain. 10/15/22   Fay Records, MD  oxyCODONE (OXY IR/ROXICODONE) 5 MG immediate release tablet Take 1 tablet (5 mg total) by mouth every 4 (four) hours as needed for moderate pain. 09/30/22   Angiulli, Lavon Paganini, PA-C  polyethylene glycol (MIRALAX / GLYCOLAX) 17 g packet Take 17 g by mouth daily. 09/30/22   Angiulli, Lavon Paganini, PA-C  potassium chloride (KLOR-CON) 10 MEQ tablet Take 10 mEq by mouth daily. 09/07/22   [provider]  psyllium (HYDROCIL/METAMUCIL) 95 % PACK Take 1 packet by mouth daily. 09/30/22   Angiulli, Lavon Paganini, PA-C  sodium bicarbonate 650 MG tablet Take 1 tablet (650 mg total) by mouth 2 (two) times daily. 09/30/22   Angiulli, Lavon Paganini, PA-C  torsemide (DEMADEX) 20 MG tablet Take 2 tablets (40 mg total) by mouth daily. 09/30/22   Angiulli, Lavon Paganini, PA-C  valsartan (DIOVAN) 80 MG tablet Take 1 tablet (80 mg total) by mouth daily. 09/30/22   Angiulli, Lavon Paganini, PA-C  Vitamin D, Ergocalciferol, (DRISDOL) 1.25 MG (50000 UNIT) CAPS capsule Take 1 capsule (50,000 Units total) by mouth every 7 (seven) days. 09/30/22   Cathlyn Parsons, PA-C    Physical Exam: Vitals:   10/16/22 0559 10/16/22 1003 10/16/22 1006 10/16/22 1230  BP: (!) 129/52  134/62 (!) 113/92  Pulse: 68   68 65  Resp: (!) 22  18  Temp: 97.7 F (36.5 C) 98.1 F (36.7 C)    TempSrc: Oral     SpO2: 93%   93%   General:  Appears old, more chronically ill than on prior admission Eyes:  EOMI, normal lids, iris ENT:  grossly normal hearing, lips & tongue, mmm; artificial dentition Neck:  no LAD, masses or thyromegaly Cardiovascular:  RRR, no m/r/g. 2-3+ LE edema.  Respiratory:   CTA bilaterally with no wheezes/rales/rhonchi.  Normal respiratory effort, lying mostly flat on RA. Abdomen:  soft, NT, ND Skin:  no rash or induration seen on limited exam Musculoskeletal:  grossly normal tone BUE/BLE, good ROM, no bony abnormality Psychiatric: blunted mood and affect, speech fluent and appropriate, AOx3 Neurologic:  CN 2-12 grossly intact, moves all extremities in coordinated fashion   Radiological Exams on Admission: Independently reviewed - see discussion in A/P where applicable  DG Chest 2 View  Result Date: 10/16/2022 CLINICAL DATA:  Shortness of breath EXAM: CHEST - 2 VIEW COMPARISON:  08/29/2022 FINDINGS: Redemonstrated right chest port with catheter tip overlying the superior cavoatrial junction. Cardiomegaly. Increased right pleural effusion, now moderate, with associated atelectasis. Trace left pleural effusion. No pneumothorax. No acute osseous abnormality. IMPRESSION: 1. Increased right pleural effusion, now moderate, with associated atelectasis. 2. Trace left pleural effusion. Electronically Signed   By: Merilyn Baba M.D.   On: 10/16/2022 03:15    EKG: Independently reviewed.  Afib with rate 66; low voltage with no evidence of acute ischemia   Labs on Admission: I have personally reviewed the available labs and imaging studies at the time of the admission.  Pertinent labs:    BUN 78/Creatinine 3.26/GFR 15; 42/1.91/26 on 10/25 BNP 1198.5; 447.4 on 05/08/22 HS troponin 10, 12 WBC 4.7 Hgb 9.2   Assessment and Plan: Principal Problem:   Acute on chronic diastolic (congestive)  heart failure (HCC) Active Problems:   Hyperlipidemia   Essential hypertension   CAD (coronary artery disease)   Type 2 diabetes mellitus with hyperglycemia, with long-term current use of insulin (HCC)   Seizure (HCC)   Acute renal failure superimposed on stage 4 chronic kidney disease ([place])   DNR (do not resuscitate)   Paroxysmal atrial fibrillation (HCC)   Chronic pain disorder      Acute on chronic diastolic CHF -Patient with known h/o chronic diastolic CHF presenting with worsening edema, developing anasarca -She was admitted for this issue last in 04/2022 and did not have frank pulmonary edema at that time -Echo on 5/11 with preserved EF; will repeat -Previously started on Entresto but this was stopped due to "itching" and changed to valsartan -Hold Jardiance -CXR without frank pulmonary edema -Markedly elevated BNP compared to prior -Acute decompensated CHF seems probable as diagnosis -Will admit, as per the Emergency HF Mortality Risk Grade.  The patient has: AKI on CKD with >25% decrease in GFR. -CHF order set utilized -Was given Lasix 40 mg x 1 in ER and will repeat with 40 mg IV BID -Continue [place] O2 prn for now -Consult to cardiology   AKI on Stage 4 CKD -Likely associated with diminished renal perfusion in the setting of volume overload -Needs ongoing monitoring -Hold Jardiance and valsartan -Continue bicarb  Afib -Continue Toprol XL for rate control -Continue Eliquis   H/o AML and recent breast cancer -Followed by Dr. [name], last seen 7/24 -She is in clinical remission and osseous changes on prior imaging are thought to be residual from AML treatment (also in remission from this) -She  has a port-a-cath because she wanted to keep it, currently accessed -Continue letrozole for now   Seizure d/o -Continue Keppra   COPD -Uses Breo prn; will continue   HTN -Continue metoprolol -Hold valsatran, Jardiance -Will also add prn hydralazine   HLD -Continue  Lipitor   DM -Last A1c was 9.9, indicating poor control -Hold glargine for now -Will cover with moderate-scale SSI for now   Chronic pain -I have reviewed this patient in the [place] Controlled Substances Reporting System.  She is receiving medications from only one provider and appears to be taking them as prescribed. -She is at high risk of opioid misuse or diversion -Continue Ativan, Robaxin, Oxy  CAD -s/p stent -Too high risk for CABG in 2018 -Denies CP -Continue Imdur, Plavix   DNR  -I have discussed code status with the patient and she would prefer to die a natural death should that situation arise. -She will need a gold out of facility DNR form at the time of discharge     Advance Care Planning:   Code Status: DNR   Consults: Cardiology; CHF navigator; nutrition; PT/OT; TOC team  DVT Prophylaxis: Eliquis  Family Communication: None present; I spoke with her husband by telephone at the time of admission  Severity of Illness: The appropriate patient status for this patient is INPATIENT. Inpatient status is judged to be reasonable and necessary in order to provide the required intensity of service to ensure the patient's safety. The patient's presenting symptoms, physical exam findings, and initial radiographic and laboratory data in the context of their chronic comorbidities is felt to place them at high risk for further clinical deterioration. Furthermore, it is not anticipated that the patient will be medically stable for discharge from the hospital within 2 midnights of admission.   * I certify that at the point of admission it is my clinical judgment that the patient will require inpatient hospital care spanning beyond 2 midnights from the point of admission due to high intensity of service, high risk for further deterioration and high frequency of surveillance required.*  Author: Karmen Bongo, MD 10/16/2022 1:31 PM  For on call review [email or web address].

## 2022-10-16 NOTE — ED Provider Notes (Signed)
[facility] EMERGENCY DEPARTMENT Provider Note   CSN: [id number] Arrival date & time: 10/16/22  0230     History  Chief Complaint  Patient presents with   Edema Legs / SOB     CHF    [name] is a 81 y.o. female.  [name] is a 81 y.o. female with a history of CHF, NSTEMI, atrial flutter, AML, COPD, CKD, hypertension, hyperlipidemia, who presents to the emergency department for evaluation of worsening edema and shortness of breath.  Patient reports that she was admitted to the hospital in September after hip fracture.  She then stayed in inpatient rehab for a month and during this admission she had issues with swelling and edema.  She was discharged from rehab on 10/26 and reports since then despite taking her torsemide she has had worsening lower extremity edema as well as some swelling in her arms and she reports she has had some weeping fluid from her arms and legs.  She does not check her weights regularly but feels as though she is continuing to gain weight.  She also reports that she has been more short of breath over the past week, had contacted her PCP regarding this and was treated for possible COPD exacerbation without improvement.  Patient reports that over the past day or so she has had decreased urinary output.  Denies chest pain or abdominal pain.  No lightheadedness or syncope.  No other aggravating or alleviating factors.  The history is provided by the patient and medical records.       Home Medications Prior to Admission medications   Medication Sig Start Date End Date Taking? Authorizing Provider  apixaban (ELIQUIS) 2.5 MG TABS tablet Take 1 tablet (2.5 mg total) by mouth 2 (two) times daily. 09/30/22   Angiulli, Lavon Paganini, PA-C  ascorbic acid (VITAMIN C) 1000 MG tablet Take 1 tablet (1,000 mg total) by mouth daily. 09/30/22   Angiulli, Lavon Paganini, PA-C  atorvastatin (LIPITOR) 40 MG tablet Take 1 tablet (40 mg total) by mouth daily. 09/30/22    Angiulli, Lavon Paganini, PA-C  clopidogrel (PLAVIX) 75 MG tablet Take 1 tablet (75 mg total) by mouth every evening. 09/30/22   Angiulli, Lavon Paganini, PA-C  cyanocobalamin (VITAMIN B12) 1000 MCG/ML injection INJECT 1ML IN THE MUSCLE EVERY 30 DAYS AS DIRECTED 08/28/22   Isaac Bliss, Rayford Halsted, MD  docusate sodium (COLACE) 100 MG capsule Take 1 capsule (100 mg total) by mouth 2 (two) times daily. 09/30/22   Angiulli, Lavon Paganini, PA-C  empagliflozin (JARDIANCE) 25 MG TABS tablet Take 1 tablet (25 mg total) by mouth daily. 09/30/22   Angiulli, Lavon Paganini, PA-C  fluticasone furoate-vilanterol (BREO ELLIPTA) 200-25 MCG/ACT AEPB Inhale 1 puff into the lungs as needed (wheezing SOB). 09/30/22   Angiulli, Lavon Paganini, PA-C  insulin glargine, 2 Unit Dial, (TOUJEO MAX SOLOSTAR) 300 UNIT/ML Solostar Pen Inject 6 Units into the skin at bedtime. 09/05/22   Florencia Reasons, MD  isosorbide mononitrate (IMDUR) 30 MG 24 hr tablet Take 1 tablet (30 mg total) by mouth daily. 09/30/22   Angiulli, Lavon Paganini, PA-C  letrozole [facility]) 2.5 MG tablet Take 1 tablet (2.5 mg total) by mouth daily. 02/14/22   Owens Shark, NP  levETIRAcetam (KEPPRA) 500 MG tablet Take 1 tablet (500 mg total) by mouth 2 (two) times daily. 09/30/22   Angiulli, Lavon Paganini, PA-C  lidocaine (LIDODERM) 5 % Place 1 patch onto the skin daily. Remove & Discard patch within  12 hours or as directed by MD 09/30/22   Angiulli, Lavon Paganini, PA-C  lipase/protease/amylase (CREON) 36000 UNITS CPEP capsule Take 2 capsules (72,000 Units total) by mouth 3 (three) times daily with meals. Patient taking differently: Take 72,000 Units by mouth as needed. 12/17/21   Esterwood, Amy S, PA-C  LORazepam (ATIVAN) 0.5 MG tablet Take 1 tablet (0.5 mg total) by mouth every 6 (six) hours as needed for anxiety. 09/30/22   Angiulli, Lavon Paganini, PA-C  melatonin 3 MG TABS tablet Take 1 tablet (3 mg total) by mouth at bedtime. 09/30/22   Angiulli, Lavon Paganini, PA-C  methocarbamol (ROBAXIN) 500 MG tablet Take 1  tablet (500 mg total) by mouth every 4 (four) hours as needed for muscle spasms. 09/30/22   Angiulli, Lavon Paganini, PA-C  metoprolol succinate (TOPROL-XL) 25 MG 24 hr tablet Take 0.5 tablets (12.5 mg total) by mouth daily. Take with or immediately following a meal. 09/30/22   Angiulli, Lavon Paganini, PA-C  Multiple Vitamin (MULTIVITAMIN WITH MINERALS) TABS tablet Take 1 tablet by mouth daily. 09/30/22   Angiulli, Lavon Paganini, PA-C  nitroGLYCERIN (NITROSTAT) 0.4 MG SL tablet Place 1 tablet (0.4 mg total) under the tongue every 5 (five) minutes as needed for chest pain. 10/15/22   Fay Records, MD  oxyCODONE (OXY IR/ROXICODONE) 5 MG immediate release tablet Take 1 tablet (5 mg total) by mouth every 4 (four) hours as needed for moderate pain. 09/30/22   Angiulli, Lavon Paganini, PA-C  polyethylene glycol (MIRALAX / GLYCOLAX) 17 g packet Take 17 g by mouth daily. 09/30/22   Angiulli, Lavon Paganini, PA-C  potassium chloride (KLOR-CON) 10 MEQ tablet Take 10 mEq by mouth daily. 09/07/22   [provider]  psyllium (HYDROCIL/METAMUCIL) 95 % PACK Take 1 packet by mouth daily. 09/30/22   Angiulli, Lavon Paganini, PA-C  sodium bicarbonate 650 MG tablet Take 1 tablet (650 mg total) by mouth 2 (two) times daily. 09/30/22   Angiulli, Lavon Paganini, PA-C  torsemide (DEMADEX) 20 MG tablet Take 2 tablets (40 mg total) by mouth daily. 09/30/22   Angiulli, Lavon Paganini, PA-C  valsartan (DIOVAN) 80 MG tablet Take 1 tablet (80 mg total) by mouth daily. 09/30/22   Angiulli, Lavon Paganini, PA-C  Vitamin D, Ergocalciferol, (DRISDOL) 1.25 MG (50000 UNIT) CAPS capsule Take 1 capsule (50,000 Units total) by mouth every 7 (seven) days. 09/30/22   Angiulli, Lavon Paganini, PA-C      Allergies    Patient has no known allergies.    Review of Systems   Review of Systems  Constitutional:  Negative for chills and fever.  HENT: Negative.    Respiratory:  Negative for cough and shortness of breath.   Cardiovascular:  Positive for leg swelling. Negative for chest pain  and palpitations.  Gastrointestinal:  Negative for abdominal pain, nausea and vomiting.  Musculoskeletal:  Positive for myalgias.  All other systems reviewed and are negative.   Physical Exam Updated Vital Signs BP (!) 129/52 (BP Location: Right Arm)   Pulse 68   Temp 97.7 F (36.5 C) (Oral)   Resp (!) 22   SpO2 93%  Physical Exam Vitals and nursing note reviewed.  Constitutional:      General: She is not in acute distress.    Appearance: Normal appearance. She is well-developed. She is ill-appearing. She is not diaphoretic.     Comments: Elderly female alert but ill-appearing  HENT:     Head: Normocephalic and atraumatic.     Mouth/Throat:  Mouth: Mucous membranes are moist.     Pharynx: Oropharynx is clear.  Eyes:     General:        Right eye: No discharge.        Left eye: No discharge.  Cardiovascular:     Rate and Rhythm: Normal rate and regular rhythm.     Pulses: Normal pulses.     Heart sounds: Normal heart sounds.  Pulmonary:     Effort: Pulmonary effort is normal. No respiratory distress.     Breath sounds: Rales present. No wheezing.     Comments: Patient is tachypneic with respiratory rate in the 30s but maintaining O2 sats on room air, able to speak in short sentences, on auscultation breath sounds diminished in the right lung base with crackles in the right midlung field and in the left lung base. Abdominal:     General: Bowel sounds are normal. There is no distension.     Palpations: Abdomen is soft. There is no mass.     Tenderness: There is no abdominal tenderness. There is no guarding.     Comments: Abdomen soft, nondistended, nontender to palpation in all quadrants without guarding or peritoneal signs  Musculoskeletal:        General: No deformity.     Cervical back: Neck supple.     Comments: 2+ pitting edema bilateral lower extremities up to the upper shin and some edema noted to bilateral forearms as well.  Small amounts of clear fluid weeping  from extremities  Skin:    General: Skin is warm and dry.     Capillary Refill: Capillary refill takes less than 2 seconds.  Neurological:     Mental Status: She is alert and oriented to person, place, and time.     Coordination: Coordination normal.     Comments: Speech is clear, able to follow commands Moves extremities without ataxia, coordination intact  Psychiatric:        Mood and Affect: Mood normal.        Behavior: Behavior normal.     ED Results / Procedures / Treatments   Labs (all labs ordered are listed, but only abnormal results are displayed) Labs Reviewed  CBC WITH DIFFERENTIAL/PLATELET - Abnormal; Notable for the following components:      Result Value   RBC 2.94 (*)    Hemoglobin 9.2 (*)    HCT 28.9 (*)    RDW 15.9 (*)    Platelets 136 (*)    All other components within normal limits  BASIC METABOLIC PANEL - Abnormal; Notable for the following components:   CO2 19 (*)    BUN 78 (*)    Creatinine, Ser 3.26 (*)    Calcium 6.9 (*)    GFR, Estimated 14 (*)    All other components within normal limits  BRAIN NATRIURETIC PEPTIDE - Abnormal; Notable for the following components:   B Natriuretic Peptide 1,198.5 (*)    All other components within normal limits  TROPONIN I (HIGH SENSITIVITY)  TROPONIN I (HIGH SENSITIVITY)    EKG EKG Interpretation  Date/Time:  Thursday October 16 2022 02:54:59 EST Ventricular Rate:  66 PR Interval:    QRS Duration: 70 QT Interval:  420 QTC Calculation: 440 R Axis:   127 Text Interpretation: Atrial fibrillation Low voltage QRS Anterolateral infarct , age undetermined Abnormal ECG When compared with ECG of 11-Sep-2022 10:43, PREVIOUS ECG IS PRESENT No significant change since last tracing Confirmed by Isla Pence [phone]) on 10/16/2022 7:08:36 AM  Radiology DG Chest 2 View  Result Date: 10/16/2022 CLINICAL DATA:  Shortness of breath EXAM: CHEST - 2 VIEW COMPARISON:  08/29/2022 FINDINGS: Redemonstrated right chest port  with catheter tip overlying the superior cavoatrial junction. Cardiomegaly. Increased right pleural effusion, now moderate, with associated atelectasis. Trace left pleural effusion. No pneumothorax. No acute osseous abnormality. IMPRESSION: 1. Increased right pleural effusion, now moderate, with associated atelectasis. 2. Trace left pleural effusion. Electronically Signed   By: Merilyn Baba M.D.   On: 10/16/2022 03:15    Procedures Procedures    Medications Ordered in ED Medications  furosemide (LASIX) injection 40 mg (has no administration in time range)    ED Course/ Medical Decision Making/ A&P                           Medical Decision Making  81 y.o. female presents to the ED with complaints of worsening extremity edema and shortness of breath, this involves an extensive number of treatment options, and is a complaint that carries with it a high risk of complications and morbidity.  The differential diagnosis includes acute on chronic heart failure, hepatic failure, renal failure, AKI, anasarca   On arrival pt is nontoxic, vitals significant for tachypnea, maintaining O2 sats, normotensive.   Additional history obtained from chart review. Previous records obtained and reviewed in particular from patient's recent admission and inpatient rehab stay, discharged on 10/26  I ordered medication including 40 mg IV Lasix for edema  Lab Tests:  I Ordered, reviewed, and interpreted labs, which included: No leukocytosis, hemoglobin slightly improved from prior, patient does have worsening renal function with creatinine of 3.26 and BUN of 78, during recent hospitalization last creatinine was 1.91, likely AKI in the setting of heart failure although acute kidney injury could be contributing to fluid overload as well.  BNP is significantly worsened, BNP 400s-500s during recent admission, today 1198.5.  Imaging Studies ordered:  I ordered imaging studies which included chest x-ray, I independently  visualized and interpreted imaging which showed worsening right-sided pleural effusion, now moderate.  Cardiomegaly present as well.  There is some associated atelectasis with pleural effusion.  ED Course:   Patient with worsening peripheral edema no experiencing some swelling in the arms as well.  BNP has more than doubled since recent hospitalization despite taking diuretics at home.  Patient has worsening renal function as well.  No palpable distended bladder on exam but will get bladder scan to see if patient is having any retention or obstruction worsening renal function.  We will also give IV Lasix 40 mg to assess patient's urinary response.  Patient will require admission for acute on chronic heart failure and AKI.  I requested consultation with the hospitalist for admission, case discussed with Dr. Karmen Bongo including pertinent labs, imaging and plan, she will see and admit the patient.    Portions of this note were generated with Lobbyist. Dictation errors may occur despite best attempts at proofreading.         Final Clinical Impression(s) / ED Diagnoses Final diagnoses:  Acute on chronic combined systolic and diastolic heart failure ([place])  AKI (acute kidney injury) [facility] [place])    Rx / DC Orders ED Discharge Orders     None         Jacqlyn Larsen, [place] 10/16/22 0944    Isla Pence, MD 10/16/22 1133

## 2022-10-16 NOTE — ED Notes (Signed)
Echo at bedside at this time

## 2022-10-16 NOTE — ED Notes (Signed)
Pt CBG was low (60), this RN gave the pt some orange juice and 2 crackers.

## 2022-10-16 NOTE — ED Notes (Signed)
ED TO INPATIENT HANDOFF REPORT  ED Nurse Name and Phone #: Matsuko Kretz RN [phone]  S Name/Age/Gender [name] 81 y.o. female Room/Bed: 1C/042C  Code Status   Code Status: DNR  Home/SNF/Other Home Patient oriented to: self, place, time, and situation Is this baseline? Yes   Triage Complete: Triage complete  Chief Complaint Acute on chronic diastolic (congestive) heart failure (HCC) [I50.33]  Triage Note Patient reports worsening edema at lower legs / feet this week with mild SOB , history of CHF .    Allergies No Known Allergies  Level of Care/Admitting Diagnosis ED Disposition     ED Disposition  Admit   Condition  --   Comment  Hospital Area: [facility] [[id number]]  Level of Care: Telemetry Cardiac [[id number]]  May admit patient to [facility] or [facility] if equivalent level of care is available:: No  Covid Evaluation: Asymptomatic - no recent exposure (last 10 days) testing not required  Diagnosis: Acute on chronic diastolic (congestive) heart failure [facility]) [[id number]]  Admitting Physician: Karmen Bongo [[id number]]  Attending Physician: Karmen Bongo [[id number]]  Certification:: I certify this patient will need inpatient services for at least 2 midnights  Estimated Length of Stay: 3          B Medical/Surgery History Past Medical History:  Diagnosis Date   Acute myelogenous leukemia ([place]) 02/2014   Acute pancreatitis    Arthritis    "back" (09/17/2017)   Atrial flutter ([place])    Cardiomyopathy ([place])    CHF (congestive heart failure) ([place])    Chronic kidney disease, stage II (mild)    Chronic lower back pain    Colon polyps 04/29/2010   TUBULAR ADENOMA AND A SERRATED ADENOMA   COPD (chronic obstructive pulmonary disease) ([place])    CORONARY ARTERY DISEASE 12/24/2007   DIABETES MELLITUS, TYPE II 07/13/2007   History of blood transfusion 2015   "related to leukemia"   History of kidney stones 12/24/2007   History of uterine cancer     HYPERLIPIDEMIA 12/24/2007   HYPERTENSION 07/13/2007   NSTEMI (non-ST elevated myocardial infarction) ([place]) 09/17/2017   Uterine cancer ([place])    Past Surgical History:  Procedure Laterality Date   ABDOMINAL HYSTERECTOMY     "still have my ovaries"   BREAST BIOPSY Left 09/22/2019   BREAST LUMPECTOMY Left 10/24/2019   BREAST LUMPECTOMY WITH RADIOACTIVE SEED LOCALIZATION Left 10/24/2019   Procedure: LEFT BREAST LUMPECTOMY WITH RADIOACTIVE SEED LOCALIZATION;  Surgeon: Jovita Kussmaul, MD;  Location: [facility];  Service: General;  Laterality: Left;   CARDIAC CATHETERIZATION  2002   CARDIAC CATHETERIZATION  09/17/2017   CATARACT EXTRACTION W/ INTRAOCULAR LENS  IMPLANT, BILATERAL Bilateral    COLONOSCOPY W/ BIOPSIES AND POLYPECTOMY  2011   CORONARY STENT INTERVENTION N/A 09/21/2017   Procedure: CORONARY STENT INTERVENTION;  Surgeon: Troy Sine, MD;  Location: [facility];  Service: Cardiovascular;  Laterality: N/A;   DILATION AND CURETTAGE OF UTERUS     ESOPHAGOGASTRODUODENOSCOPY N/A 01/30/2022   Procedure: ESOPHAGOGASTRODUODENOSCOPY (EGD);  Surgeon: Milus Banister, MD;  Location: [facility];  Service: Endoscopy;  Laterality: N/A;   EUS N/A 01/30/2022   Procedure: UPPER ENDOSCOPIC ULTRASOUND (EUS) RADIAL;  Surgeon: Milus Banister, MD;  Location: [facility];  Service: Endoscopy;  Laterality: N/A;   EXCISIONAL HEMORRHOIDECTOMY  X 2   FINE NEEDLE ASPIRATION N/A 01/30/2022   Procedure: FINE NEEDLE ASPIRATION (FNA) LINEAR;  Surgeon: Milus Banister, MD;  Location: [facility];  Service: Endoscopy;  Laterality: N/A;   GANGLION CYST EXCISION Left X 3   INTRAMEDULLARY (IM) NAIL INTERTROCHANTERIC Right 08/30/2022   Procedure: INTRAMEDULLARY (IM) NAIL INTERTROCHANTERIC;  Surgeon: Altamese Stuart, MD;  Location: [facility];  Service: Orthopedics;  Laterality: Right;   INTRAVASCULAR PRESSURE WIRE/FFR STUDY N/A 09/17/2017   Procedure: INTRAVASCULAR PRESSURE WIRE/FFR STUDY;   Surgeon: Nelva Bush, MD;  Location: [facility];  Service: Cardiovascular;  Laterality: N/A;   NASAL SINUS SURGERY     PORTA CATH INSERTION Right 2013   RIGHT/LEFT HEART CATH AND CORONARY ANGIOGRAPHY N/A 09/17/2017   Procedure: RIGHT/LEFT HEART CATH AND CORONARY ANGIOGRAPHY;  Surgeon: Nelva Bush, MD;  Location: [facility];  Service: Cardiovascular;  Laterality: N/A;   TUBAL LIGATION       A IV Location/Drains/Wounds Patient Lines/Drains/Airways Status     Active Line/Drains/Airways     Name Placement date Placement time Site Days   Implanted Port Right Chest --  --  Chest  --   Incision (Closed) 08/30/22 Hip Right 08/30/22  0956  -- 47            Intake/Output Last 24 hours No intake or output data in the 24 hours ending 10/16/22 1929  Labs/Imaging Results for orders placed or performed during the hospital encounter of 10/16/22 (from the past 48 hour(s))  CBC with Differential     Status: Abnormal   Collection Time: 10/16/22  3:42 AM  Result Value Ref Range   WBC 4.7 4.0 - 10.5 K/uL   RBC 2.94 (L) 3.87 - 5.11 MIL/uL   Hemoglobin 9.2 (L) 12.0 - 15.0 g/dL   HCT 28.9 (L) 36.0 - 46.0 %   MCV 98.3 80.0 - 100.0 fL   MCH 31.3 26.0 - 34.0 pg   MCHC 31.8 30.0 - 36.0 g/dL   RDW 15.9 (H) 11.5 - 15.5 %   Platelets 136 (L) 150 - 400 K/uL   nRBC 0.0 0.0 - 0.2 %   Neutrophils Relative % 67 %   Neutro Abs 3.1 1.7 - 7.7 K/uL   Lymphocytes Relative 20 %   Lymphs Abs 1.0 0.7 - 4.0 K/uL   Monocytes Relative 9 %   Monocytes Absolute 0.4 0.1 - 1.0 K/uL   Eosinophils Relative 4 %   Eosinophils Absolute 0.2 0.0 - 0.5 K/uL   Basophils Relative 0 %   Basophils Absolute 0.0 0.0 - 0.1 K/uL   Immature Granulocytes 0 %   Abs Immature Granulocytes 0.01 0.00 - 0.07 K/uL    Comment: Performed at [facility], [address], [place], [place] [address]  Basic metabolic panel     Status: Abnormal   Collection Time: 10/16/22  3:42 AM  Result Value Ref Range    Sodium 140 135 - 145 mmol/L   Potassium 4.4 3.5 - 5.1 mmol/L   Chloride 111 98 - 111 mmol/L   CO2 19 (L) 22 - 32 mmol/L   Glucose, Bld 90 70 - 99 mg/dL    Comment: Glucose reference range applies only to samples taken after fasting for at least 8 hours.   BUN 78 (H) 8 - 23 mg/dL   Creatinine, Ser 3.26 (H) 0.44 - 1.00 mg/dL   Calcium 6.9 (L) 8.9 - 10.3 mg/dL   GFR, Estimated 14 (L) >60 mL/min    Comment: (NOTE) Calculated using the CKD-EPI Creatinine Equation (2021)    Anion gap 10 5 - 15    Comment: Performed at [facility],  [address], [place], [place] [address]  Brain natriuretic peptide     Status: Abnormal   Collection Time: 10/16/22  3:42 AM  Result Value Ref Range   B Natriuretic Peptide 1,198.5 (H) 0.0 - 100.0 pg/mL    Comment: Performed at [facility] [address], [place], [place] [address]  Troponin I (High Sensitivity)     Status: None   Collection Time: 10/16/22  3:42 AM  Result Value Ref Range   Troponin I (High Sensitivity) 10 <18 ng/L    Comment: (NOTE) Elevated high sensitivity troponin I (hsTnI) values and significant  changes across serial measurements may suggest ACS but many other  chronic and acute conditions are known to elevate hsTnI results.  Refer to the "Links" section for chest pain algorithms and additional  guidance. Performed at [facility], [place] [address] [place], [place] [address]   Troponin I (High Sensitivity)     Status: None   Collection Time: 10/16/22  5:10 AM  Result Value Ref Range   Troponin I (High Sensitivity) 12 <18 ng/L    Comment: (NOTE) Elevated high sensitivity troponin I (hsTnI) values and significant  changes across serial measurements may suggest ACS but many other  chronic and acute conditions are known to elevate hsTnI results.  Refer to the "Links" section for chest pain algorithms and additional  guidance. Performed at [facility], [place] [address], [place], [place] [address]    Hepatic function panel     Status: Abnormal   Collection Time: 10/16/22  5:10 AM  Result Value Ref Range   Total Protein 5.6 (L) 6.5 - 8.1 g/dL   Albumin 2.8 (L) 3.5 - 5.0 g/dL   AST 19 15 - 41 U/L   ALT 9 0 - 44 U/L   Alkaline Phosphatase 116 38 - 126 U/L   Total Bilirubin 0.6 0.3 - 1.2 mg/dL   Bilirubin, Direct 0.2 0.0 - 0.2 mg/dL   Indirect Bilirubin 0.4 0.3 - 0.9 mg/dL    Comment: Performed at [facility] [address], [place], [place] [address]  CBG monitoring, ED     Status: Abnormal   Collection Time: 10/16/22 12:05 PM  Result Value Ref Range   Glucose-Capillary 60 (L) 70 - 99 mg/dL    Comment: Glucose reference range applies only to samples taken after fasting for at least 8 hours.   Comment 1 Notify RN    Comment 2 Document in Chart    *Note: Due to a large number of results and/or encounters for the requested time period, some results have not been displayed. A complete set of results can be found in Results Review.   US RENAL  Result Date: 10/16/2022 CLINICAL DATA:  AK I EXAM: RENAL / URINARY TRACT ULTRASOUND COMPLETE COMPARISON:  Renal ultrasound dated 08/31/2022 FINDINGS: Right Kidney: Renal measurements: 10.3 x 5.2 x 4.0 cm = volume: 109.8 mL (previously 160 ml). Echogenicity is slightly increased. No hydronephrosis visualized. Within the right kidney there is a 1.4 cm anechoic lesion, compatible with a simple renal cyst, and unchanged from prior exam. Left Kidney: Renal measurements: 9.3 x 4.6 x 4.3 cm = volume: 95.5 mL (previously 145 ml). Echogenicity is slightly increased. No hydronephrosis visualized. Within left kidney, there are newly visualized anechoic lesions along the upper pole measuring up to 1.1 cm and 0.7 cm, which is also likely represent renal cysts and likely poorly visualized on prior exam due to poor visualization of  the left kidney. Bladder: Appears normal for degree of bladder distention. Other: Incidentally noted are bilateral pleural effusions  and possible small amount of perihepatic ascites. IMPRESSION: 1. No hydronephrosis or nephrolithiasis. 2. Findings suggestive of medical renal disease. 3. Incidentally noted are small bilateral pleural effusions and possible perihepatic ascites, new from prior. Electronically Signed   By: Marin Roberts M.D.   On: 10/16/2022 17:41   ECHOCARDIOGRAM COMPLETE  Result Date: 10/16/2022    ECHOCARDIOGRAM REPORT   Patient Name:   [name] Date of Exam: 10/16/2022 Medical Rec #:  [id number]     Height:       62.0 in Accession #:    [id number]    Weight:       172.2 lb Date of Birth:  January 24, 1941     BSA:          1.794 m Patient Age:    39 years      BP:           131/66 mmHg Patient Gender: F             HR:           50 bpm. Exam Location:  Inpatient Procedure: 2D Echo, 3D Echo, Cardiac Doppler and Color Doppler Indications:    I50.40* Unspecified combined systolic (congestive) and diastolic                 (congestive) heart failure  History:        Patient has prior history of Echocardiogram examinations, most                 recent 04/17/2022. CHF, CAD, Abnormal ECG, TIA, Aortic Valve                 Disease, Arrythmias:Atrial Fibrillation and Atrial Flutter,                 Signs/Symptoms:Chest Pain; Risk Factors:Hypertension,                 Dyslipidemia and Diabetes. Aortic stenosis. Breast cancer.                 Seizure.  Sonographer:    Roseanna Rainbow RDCS Referring Phys: [id number] JENNIFER YATES  Sonographer Comments: Technically difficult study due to poor echo windows. Image acquisition challenging due to patient body habitus. IMPRESSIONS  1. Left ventricular ejection fraction, by estimation, is 60 to 65%. The left ventricle has normal function. The left ventricle has no regional wall motion abnormalities. There is mild left ventricular hypertrophy. Left ventricular diastolic parameters are indeterminate.  2. Right ventricular systolic function is normal. The right ventricular size is normal. There is mildly  elevated pulmonary artery systolic pressure.  3. Left atrial size was moderately dilated.  4. The mitral valve is degenerative. Mild mitral valve regurgitation. No evidence of mitral stenosis. Severe mitral annular calcification.  5. Tricuspid valve regurgitation is moderate to severe.  6. The aortic valve is normal in structure. There is severe calcifcation of the aortic valve. There is severe thickening of the aortic valve. Aortic valve regurgitation is not visualized. Mild to moderate aortic valve stenosis.  7. The inferior vena cava is normal in size with greater than 50% respiratory variability, suggesting right atrial pressure of 3 mmHg. FINDINGS  Left Ventricle: Left ventricular ejection fraction, by estimation, is 60 to 65%. The left ventricle has normal function. The left ventricle has no regional wall motion abnormalities. The left ventricular internal cavity size was  normal in size. There is  mild left ventricular hypertrophy. Left ventricular diastolic parameters are indeterminate. Right Ventricle: The right ventricular size is normal. No increase in right ventricular wall thickness. Right ventricular systolic function is normal. There is mildly elevated pulmonary artery systolic pressure. The tricuspid regurgitant velocity is 2.87  m/s, and with an assumed right atrial pressure of 8 mmHg, the estimated right ventricular systolic pressure is 18.5 mmHg. Left Atrium: Left atrial size was moderately dilated. Right Atrium: Right atrial size was normal in size. Pericardium: Trivial pericardial effusion is present. The pericardial effusion is posterior to the left ventricle. Mitral Valve: The mitral valve is degenerative in appearance. There is severe thickening of the mitral valve leaflet(s). There is severe calcification of the mitral valve leaflet(s). Severe mitral annular calcification. Mild mitral valve regurgitation. No evidence of mitral valve stenosis. Tricuspid Valve: The tricuspid valve is normal in  structure. Tricuspid valve regurgitation is moderate to severe. No evidence of tricuspid stenosis. Aortic Valve: The aortic valve is normal in structure. There is severe calcifcation of the aortic valve. There is severe thickening of the aortic valve. Aortic valve regurgitation is not visualized. Mild to moderate aortic stenosis is present. Aortic valve mean gradient measures 12.0 mmHg. Aortic valve peak gradient measures 21.3 mmHg. Aortic valve area, by VTI measures 1.01 cm. Pulmonic Valve: The pulmonic valve was normal in structure. Pulmonic valve regurgitation is trivial. No evidence of pulmonic stenosis. Aorta: The aortic root is normal in size and structure. Venous: The inferior vena cava is normal in size with greater than 50% respiratory variability, suggesting right atrial pressure of 3 mmHg. IAS/Shunts: The interatrial septum appears to be lipomatous. No atrial level shunt detected by color flow Doppler.  LEFT VENTRICLE PLAX 2D LVIDd:         4.00 cm LVIDs:         2.90 cm LV PW:         1.30 cm LV IVS:        1.30 cm LVOT diam:     1.70 cm     3D Volume EF: LV SV:         57          3D EF:        51 % LV SV Index:   32          LV EDV:       125 ml LVOT Area:     2.27 cm    LV ESV:       62 ml                            LV SV:        64 ml  LV Volumes (MOD) LV vol d, MOD A2C: 83.5 ml LV vol d, MOD A4C: 54.1 ml LV vol s, MOD A2C: 16.6 ml LV vol s, MOD A4C: 16.4 ml LV SV MOD A2C:     66.9 ml LV SV MOD A4C:     54.1 ml LV SV MOD BP:      49.8 ml RIGHT VENTRICLE            IVC RV S prime:     8.05 cm/s  IVC diam: 2.20 cm TAPSE (M-mode): 1.9 cm LEFT ATRIUM             Index        RIGHT ATRIUM  Index LA diam:        4.30 cm 2.40 cm/m   RA Area:     13.60 cm LA Vol (A2C):   60.2 ml 33.56 ml/m  RA Volume:   29.70 ml  16.56 ml/m LA Vol (A4C):   64.7 ml 36.07 ml/m LA Biplane Vol: 62.4 ml 34.79 ml/m  AORTIC VALVE AV Area (Vmax):    0.90 cm AV Area (Vmean):   0.88 cm AV Area (VTI):     1.01 cm AV  Vmax:           230.80 cm/s AV Vmean:          159.800 cm/s AV VTI:            0.567 m AV Peak Grad:      21.3 mmHg AV Mean Grad:      12.0 mmHg LVOT Vmax:         91.80 cm/s LVOT Vmean:        62.300 cm/s LVOT VTI:          0.252 m LVOT/AV VTI ratio: 0.44  AORTA Ao Root diam: 3.00 cm Ao Asc diam:  3.00 cm MITRAL VALVE                TRICUSPID VALVE MV Area (PHT): 4.24 cm     TR Peak grad:   32.9 mmHg MV Decel Time: 179 msec     TR Vmax:        287.00 cm/s MV E velocity: 118.00 cm/s                             SHUNTS                             Systemic VTI:  0.25 m                             Systemic Diam: 1.70 cm Jenkins Rouge MD Electronically signed by Jenkins Rouge MD Signature Date/Time: 10/16/2022/3:54:03 PM    Final    DG Chest 2 View  Result Date: 10/16/2022 CLINICAL DATA:  Shortness of breath EXAM: CHEST - 2 VIEW COMPARISON:  08/29/2022 FINDINGS: Redemonstrated right chest port with catheter tip overlying the superior cavoatrial junction. Cardiomegaly. Increased right pleural effusion, now moderate, with associated atelectasis. Trace left pleural effusion. No pneumothorax. No acute osseous abnormality. IMPRESSION: 1. Increased right pleural effusion, now moderate, with associated atelectasis. 2. Trace left pleural effusion. Electronically Signed   By: Merilyn Baba M.D.   On: 10/16/2022 03:15    Pending Labs Unresulted Labs (From admission, onward)     Start     Ordered   10/17/22 7353  Basic metabolic panel  Tomorrow morning,   R        10/16/22 0909   10/17/22 0500  CBC  Tomorrow morning,   R        10/16/22 0909   10/16/22 0709  Urinalysis, Routine w reflex microscopic  Once,   URGENT        10/16/22 0708            Vitals/Pain Today's Vitals   10/16/22 1745 10/16/22 1800 10/16/22 1815 10/16/22 1850  BP: (!) 118/46 131/66    Pulse: 68 (!) 42 67   Resp: (!) 21     Temp:    98  F (36.7 C)  TempSrc:    Oral  SpO2: 94% 98% 91%   PainSc:        Isolation Precautions No  active isolations  Medications Medications  oxyCODONE (Oxy IR/ROXICODONE) immediate release tablet 5 mg (has no administration in time range)  letrozole [facility]) tablet 2.5 mg (2.5 mg Oral Given 10/16/22 1153)  atorvastatin (LIPITOR) tablet 40 mg (40 mg Oral Given 10/16/22 1011)  isosorbide mononitrate (IMDUR) 24 hr tablet 30 mg (30 mg Oral Given 10/16/22 1010)  metoprolol succinate (TOPROL-XL) 24 hr tablet 12.5 mg (12.5 mg Oral Given 10/16/22 1006)  LORazepam (ATIVAN) tablet 0.5 mg (has no administration in time range)  docusate sodium (COLACE) capsule 100 mg (100 mg Oral Given 10/16/22 1010)  polyethylene glycol (MIRALAX / GLYCOLAX) packet 17 g (17 g Oral Given 10/16/22 1329)  psyllium (HYDROCIL/METAMUCIL) 1 packet (1 packet Oral Not Given 10/16/22 1401)  sodium bicarbonate tablet 650 mg (650 mg Oral Given 10/16/22 1329)  apixaban (ELIQUIS) tablet 2.5 mg (2.5 mg Oral Given 10/16/22 1152)  clopidogrel (PLAVIX) tablet 75 mg (75 mg Oral Given 10/16/22 1822)  melatonin tablet 3 mg (has no administration in time range)  levETIRAcetam (KEPPRA) tablet 500 mg (500 mg Oral Given 10/16/22 1011)  methocarbamol (ROBAXIN) tablet 500 mg (has no administration in time range)  fluticasone furoate-vilanterol (BREO ELLIPTA) 200-25 MCG/ACT 1 puff (has no administration in time range)  furosemide (LASIX) injection 40 mg (has no administration in time range)  insulin aspart (novoLOG) injection 0-15 Units (3 Units Subcutaneous Given 10/16/22 1640)  insulin aspart (novoLOG) injection 0-5 Units (has no administration in time range)  sodium chloride flush (NS) 0.9 % injection 3 mL (3 mLs Intravenous Given 10/16/22 1159)  acetaminophen (TYLENOL) tablet 650 mg (650 mg Oral Given 10/16/22 1153)    Or  acetaminophen (TYLENOL) suppository 650 mg ( Rectal See Alternative 10/16/22 1153)  traZODone (DESYREL) tablet 25 mg (has no administration in time range)  bisacodyl (DULCOLAX) EC tablet 5 mg (has no administration in time range)   ondansetron (ZOFRAN) tablet 4 mg (has no administration in time range)    Or  ondansetron (ZOFRAN) injection 4 mg (has no administration in time range)  hydrALAZINE (APRESOLINE) injection 5 mg (has no administration in time range)  furosemide (LASIX) injection 40 mg (40 mg Intravenous Given 10/16/22 1154)  furosemide (LASIX) 120 mg in dextrose 5 % 50 mL IVPB (0 mg Intravenous Stopped 10/16/22 1929)    Mobility walks with device Low fall risk   Focused Assessments Cardiac Assessment Handoff:    Lab Results  Component Value Date   TROPONINI 0.13 ([place]) 09/24/2017   Lab Results  Component Value Date   DDIMER 2.98 ([facility]) 04/17/2022   Does the Patient currently have chest pain? No   , Pulmonary Assessment Handoff:  Lung sounds:   O2 Device: Nasal Cannula O2 Flow Rate (L/min): 3 L/min    R Recommendations: See Admitting Provider Note  Report given to:   Additional Notes:

## 2022-10-16 NOTE — ED Notes (Signed)
Patient assisted to bedpan at this time.

## 2022-10-16 NOTE — ED Notes (Signed)
Patient placed in recliner for comfort 

## 2022-10-16 NOTE — ED Notes (Signed)
PATIENT HAS A PORT AND WANTING BLOOD WORK DRAWN FROM IT,.

## 2022-10-16 NOTE — Consult Note (Addendum)
Cardiology Consultation   Patient ID: [name] MRN: [id number]; DOB: January 27, 1941  Admit date: 10/16/2022 Date of Consult: 10/16/2022  PCP:  Isaac Bliss, Rayford Halsted, MD   [place] Providers Cardiologist:  Mertie Moores, MD   Patient Profile:   [name] is a 81 y.o. female with a hx of CAD s/p PCI-RCA (not a CABG candidate), chronic systolic heart failure with recovered EF, chronic diastolic heart failure, hypertension, hyperlipidemia, atrial flutter, COPD, AML in remission, breast cancer and uterine cancer with concerns for sclerotic mets on recent imaging, CKD 3B, and cirrhosis who is being seen 10/16/2022 for the evaluation of heart failure at the request of Dr. Lorin Mercy.  History of Present Illness:   [name] has a history of CAD with heart catheterization in 2018 showing multivessel disease including sequential 40% ostial and 50% distal left main stenosis, sequential proximal and mid LAD stenosis of 50-60%, 40% distal LCx, and 80% proximal to mid RCA stenosis.  FFR of the LAD was hemodynamically significant.  TCTS was consulted but felt the patient was too high risk for CABG.  She then underwent PCI/DES-mRCA.  She is maintained on Plavix monotherapy.  Echocardiogram at that time showed an LVEF 40-45%.  Follow-up echocardiogram 05/2020 on GDMT revealed a normal EF with diastolic dysfunction.  She was hospitalized in May 2023 with acute on chronic diastolic heart failure.  She also had a new diagnosis of paroxysmal atrial fibrillation (however, prior notes mention PAF).  She was diuresed and discharged with 40 mg torsemide daily, 10 mg Jardiance, and Entresto.  She was discharged on Eliquis 2.5 mg twice daily and metoprolol succinate.  Unfortunately she had itching related to [facility] and was switched to valsartan 80 mg daily.  She was last seen by Dr. Acie Fredrickson 05/19/2022 and was doing well at that time.  She was hospitalized with a hip fracture in September and was  discharged to inpatient rehab for 1 month.  She battled lower extremity swelling and was discharged on 10/02/2022 and reports since being discharged she has had worsening lower extremity swelling despite taking torsemide.  She contacted her PCP and was treated for possible COPD exacerbation without improvement.  Of note, prior hospitalization was complicated by acute urinary retention.  Unfortunately she presented to [facility] 10/15/2022 with worsening edema she also reports reduced urine output. She was admitted and has received 40 mg IV lasix x 1 dose. Cardiology consulted.   During my interview, she states she has not been euvolemic since leaving rehab.  She uptitrated her diuretic regimen to 40 mg torsemide twice daily for 4 days without improvement.  She notes that she did not have an increase in urine output during this time as expected.  She felt short of breath and had increased swelling yesterday and decided to be evaluated.  On my exam, the swelling in her hands has reduced significantly.  She is volume overloaded with edema to her abdomen. Diminished breath sounds on right consistent with pleural effusion on CXR. Bladder scan pending.  She denies shortness of breath, orthopnea, chest pain, and palpitations.  She states that she has had A-fib before.  She has had no bleeding issues on Eliquis.  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  She does state that she is having trouble affording medications, especially Eliquis.  Past Medical History:  Diagnosis Date   Acute myelogenous leukemia ([place]) 02/2014   Acute pancreatitis  Arthritis    "back" (09/17/2017)   Atrial flutter (HCC)    Cardiomyopathy ([place])    CHF (congestive heart failure) (HCC)    Chronic kidney disease, stage II (mild)    Chronic lower back pain    Colon polyps 04/29/2010   TUBULAR ADENOMA AND A SERRATED ADENOMA   COPD (chronic obstructive pulmonary disease) ([place])     CORONARY ARTERY DISEASE 12/24/2007   DIABETES MELLITUS, TYPE II 07/13/2007   History of blood transfusion 2015   "related to leukemia"   History of kidney stones 12/24/2007   History of uterine cancer    HYPERLIPIDEMIA 12/24/2007   HYPERTENSION 07/13/2007   NSTEMI (non-ST elevated myocardial infarction) ([place]) 09/17/2017   Uterine cancer ([place])     Past Surgical History:  Procedure Laterality Date   ABDOMINAL HYSTERECTOMY     "still have my ovaries"   BREAST BIOPSY Left 09/22/2019   BREAST LUMPECTOMY Left 10/24/2019   BREAST LUMPECTOMY WITH RADIOACTIVE SEED LOCALIZATION Left 10/24/2019   Procedure: LEFT BREAST LUMPECTOMY WITH RADIOACTIVE SEED LOCALIZATION;  Surgeon: Jovita Kussmaul, MD;  Location: [place];  Service: General;  Laterality: Left;   CARDIAC CATHETERIZATION  2002   CARDIAC CATHETERIZATION  09/17/2017   CATARACT EXTRACTION W/ INTRAOCULAR LENS  IMPLANT, BILATERAL Bilateral    COLONOSCOPY W/ BIOPSIES AND POLYPECTOMY  2011   CORONARY STENT INTERVENTION N/A 09/21/2017   Procedure: CORONARY STENT INTERVENTION;  Surgeon: Troy Sine, MD;  Location: [place] [facility];  Service: Cardiovascular;  Laterality: N/A;   DILATION AND CURETTAGE OF UTERUS     ESOPHAGOGASTRODUODENOSCOPY N/A 01/30/2022   Procedure: ESOPHAGOGASTRODUODENOSCOPY (EGD);  Surgeon: Milus Banister, MD;  Location: [facility];  Service: Endoscopy;  Laterality: N/A;   EUS N/A 01/30/2022   Procedure: UPPER ENDOSCOPIC ULTRASOUND (EUS) RADIAL;  Surgeon: Milus Banister, MD;  Location: [facility];  Service: Endoscopy;  Laterality: N/A;   EXCISIONAL HEMORRHOIDECTOMY  X 2   FINE NEEDLE ASPIRATION N/A 01/30/2022   Procedure: FINE NEEDLE ASPIRATION (FNA) LINEAR;  Surgeon: Milus Banister, MD;  Location: [facility];  Service: Endoscopy;  Laterality: N/A;   GANGLION CYST EXCISION Left X 3   INTRAMEDULLARY (IM) NAIL INTERTROCHANTERIC Right 08/30/2022   Procedure: INTRAMEDULLARY (IM) NAIL  INTERTROCHANTERIC;  Surgeon: Altamese Ramona, MD;  Location: [place];  Service: Orthopedics;  Laterality: Right;   INTRAVASCULAR PRESSURE WIRE/FFR STUDY N/A 09/17/2017   Procedure: INTRAVASCULAR PRESSURE WIRE/FFR STUDY;  Surgeon: Nelva Bush, MD;  Location: [place] [facility];  Service: Cardiovascular;  Laterality: N/A;   NASAL SINUS SURGERY     PORTA CATH INSERTION Right 2013   RIGHT/LEFT HEART CATH AND CORONARY ANGIOGRAPHY N/A 09/17/2017   Procedure: RIGHT/LEFT HEART CATH AND CORONARY ANGIOGRAPHY;  Surgeon: Nelva Bush, MD;  Location: [place] [facility];  Service: Cardiovascular;  Laterality: N/A;   TUBAL LIGATION       Home Medications:  Prior to Admission medications   Medication Sig Start Date End Date Taking? Authorizing Provider  apixaban (ELIQUIS) 2.5 MG TABS tablet Take 1 tablet (2.5 mg total) by mouth 2 (two) times daily. 09/30/22  Yes Angiulli, Lavon Paganini, PA-C  ascorbic acid (VITAMIN C) 1000 MG tablet Take 1 tablet (1,000 mg total) by mouth daily. 09/30/22  Yes Angiulli, Lavon Paganini, PA-C  atorvastatin (LIPITOR) 40 MG tablet Take 1 tablet (40 mg total) by mouth daily. 09/30/22  Yes Angiulli, Lavon Paganini, PA-C  clopidogrel (PLAVIX) 75 MG tablet Take 1 tablet (75 mg total) by mouth every  evening. 09/30/22  Yes Angiulli, Lavon Paganini, PA-C  cyanocobalamin (VITAMIN B12) 1000 MCG/ML injection INJECT 1ML IN THE MUSCLE EVERY 30 DAYS AS DIRECTED Patient taking differently: Inject 1,000 mcg into the muscle every 30 (thirty) days. 08/28/22  Yes Isaac Bliss, Rayford Halsted, MD  docusate sodium (COLACE) 100 MG capsule Take 1 capsule (100 mg total) by mouth 2 (two) times daily. 09/30/22  Yes Angiulli, Lavon Paganini, PA-C  empagliflozin (JARDIANCE) 25 MG TABS tablet Take 1 tablet (25 mg total) by mouth daily. 09/30/22  Yes Angiulli, Lavon Paganini, PA-C  fluticasone furoate-vilanterol (BREO ELLIPTA) 200-25 MCG/ACT AEPB Inhale 1 puff into the lungs as needed (wheezing SOB). 09/30/22  Yes Angiulli, Lavon Paganini, PA-C   insulin glargine, 2 Unit Dial, (TOUJEO MAX SOLOSTAR) 300 UNIT/ML Solostar Pen Inject 6 Units into the skin at bedtime. Patient taking differently: Inject 10-16 Units into the skin See admin instructions. Inject 10 units into the skin at supper and 16 units at bedtime 09/05/22  Yes Florencia Reasons, MD  isosorbide mononitrate (IMDUR) 30 MG 24 hr tablet Take 1 tablet (30 mg total) by mouth daily. 09/30/22  Yes Angiulli, Lavon Paganini, PA-C  letrozole [facility]) 2.5 MG tablet Take 1 tablet (2.5 mg total) by mouth daily. 02/14/22  Yes Owens Shark, NP  levETIRAcetam (KEPPRA) 500 MG tablet Take 1 tablet (500 mg total) by mouth 2 (two) times daily. 09/30/22  Yes Angiulli, Lavon Paganini, PA-C  LORazepam (ATIVAN) 0.5 MG tablet Take 1 tablet (0.5 mg total) by mouth every 6 (six) hours as needed for anxiety. 09/30/22  Yes Angiulli, Lavon Paganini, PA-C  melatonin 3 MG TABS tablet Take 1 tablet (3 mg total) by mouth at bedtime. 09/30/22  Yes Angiulli, Lavon Paganini, PA-C  methocarbamol (ROBAXIN) 500 MG tablet Take 1 tablet (500 mg total) by mouth every 4 (four) hours as needed for muscle spasms. 09/30/22  Yes Angiulli, Lavon Paganini, PA-C  metoprolol succinate (TOPROL-XL) 25 MG 24 hr tablet Take 0.5 tablets (12.5 mg total) by mouth daily. Take with or immediately following a meal. 09/30/22  Yes Angiulli, Lavon Paganini, PA-C  Multiple Vitamin (MULTIVITAMIN WITH MINERALS) TABS tablet Take 1 tablet by mouth daily. 09/30/22  Yes Angiulli, Lavon Paganini, PA-C  oxyCODONE (OXY IR/ROXICODONE) 5 MG immediate release tablet Take 1 tablet (5 mg total) by mouth every 4 (four) hours as needed for moderate pain. 09/30/22  Yes Angiulli, Lavon Paganini, PA-C  polyethylene glycol (MIRALAX / GLYCOLAX) 17 g packet Take 17 g by mouth daily. 09/30/22  Yes Angiulli, Lavon Paganini, PA-C  potassium chloride (KLOR-CON) 10 MEQ tablet Take 10 mEq by mouth daily. 09/07/22  Yes [provider]  psyllium (HYDROCIL/METAMUCIL) 95 % PACK Take 1 packet by mouth daily. 09/30/22  Yes Angiulli,  Lavon Paganini, PA-C  sodium bicarbonate 650 MG tablet Take 1 tablet (650 mg total) by mouth 2 (two) times daily. 09/30/22  Yes Angiulli, Lavon Paganini, PA-C  torsemide (DEMADEX) 20 MG tablet Take 2 tablets (40 mg total) by mouth daily. 09/30/22  Yes Angiulli, Lavon Paganini, PA-C  valsartan (DIOVAN) 80 MG tablet Take 1 tablet (80 mg total) by mouth daily. 09/30/22  Yes Angiulli, Lavon Paganini, PA-C  nitroGLYCERIN (NITROSTAT) 0.4 MG SL tablet Place 1 tablet (0.4 mg total) under the tongue every 5 (five) minutes as needed for chest pain. Patient not taking: Reported on 10/16/2022 10/15/22   Fay Records, MD  Vitamin D, Ergocalciferol, (DRISDOL) 1.25 MG (50000 UNIT) CAPS capsule Take 1 capsule (50,000 Units total) by mouth  every 7 (seven) days. Patient not taking: Reported on 10/16/2022 09/30/22   Angiulli, Lavon Paganini, PA-C    Inpatient Medications: Scheduled Meds:  apixaban  2.5 mg Oral BID   atorvastatin  40 mg Oral Daily   clopidogrel  75 mg Oral QPM   docusate sodium  100 mg Oral BID   furosemide  40 mg Intravenous BID   insulin aspart  0-15 Units Subcutaneous TID WC   insulin aspart  0-5 Units Subcutaneous QHS   insulin glargine (2 Unit Dial)  10-16 Units Subcutaneous See admin instructions   isosorbide mononitrate  30 mg Oral Daily   letrozole  2.5 mg Oral Daily   levETIRAcetam  500 mg Oral BID   melatonin  3 mg Oral QHS   metoprolol succinate  12.5 mg Oral Daily   polyethylene glycol  17 g Oral Daily   psyllium  1 packet Oral Daily   sodium bicarbonate  650 mg Oral BID   sodium chloride flush  3 mL Intravenous Q12H   Continuous Infusions:  PRN Meds: acetaminophen **OR** acetaminophen, bisacodyl, fluticasone furoate-vilanterol, hydrALAZINE, LORazepam, methocarbamol, ondansetron **OR** ondansetron (ZOFRAN) IV, oxyCODONE, traZODone  Allergies:   No Known Allergies  Social History:   Social History   Socioeconomic History   Marital status: Married    Spouse name: Joniah Bednarski   Number of children:  1   Years of education: Not on file   Highest education level: 9th grade  Occupational History   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  Tobacco Use   Smoking status: Former    Packs/day: 1.00    Years: 20.00    Total pack years: 20.00    Types: Cigarettes    Quit date: 12/08/2000    Years since quitting: 21.8   Smokeless tobacco: Never  Vaping Use   Vaping Use: Never used  Substance and Sexual Activity   Alcohol use: No    Alcohol/week: 0.0 standard drinks of alcohol   Drug use: No   Sexual activity: Never  Other Topics Concern   Not on file  Social History Narrative   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'$    Social Determinants of Health   Financial Resource Strain: Low Risk  (05/12/2022)   Overall Financial Resource Strain (CARDIA)    Difficulty of Paying Living Expenses: Not hard at all  Food Insecurity: No Food Insecurity (10/03/2022)   Hunger Vital Sign    Worried About Running Out of Food in the Last Year: Never true    Ran Out of Food in the Last Year: Never true  Transportation Needs: No Transportation Needs (10/03/2022)   PRAPARE - Hydrologist (Medical): No    Lack of Transportation (Non-Medical): No  Physical Activity: Not on file  Stress: No Stress Concern Present (05/12/2022)   [place]    Feeling of Stress : Not at all  Social Connections: Not on file  Intimate Partner Violence: Not At Risk (08/29/2022)   Humiliation, Afraid, Rape, and Kick questionnaire    Fear of Current or Ex-Partner: No    Emotionally Abused: No    Physically Abused: No    Sexually Abused: No    Family History:    Family History  Problem Relation Age of Onset   COPD Father    Esophageal cancer Father    Breast cancer Sister 92   Lung cancer Sister  Esophageal cancer Brother    Suicidality Brother    Lung cancer Sister    Lung cancer Sister     Cervical cancer Sister    Congestive Heart Failure Mother    Heart attack Maternal Grandmother    Skin cancer Maternal Grandfather    Heart attack Maternal Grandfather    Stroke Paternal Grandmother 34   Heart attack Paternal Grandfather    Cancer Maternal Uncle        unknown type, dx. >65   Cancer Paternal Aunt        unknown type, dx. >50   Cancer Paternal Uncle        unknown type, dx. >50   Cirrhosis Maternal Uncle    Skin cancer Daughter    Leukemia Cousin        maternal first cousin; dx. >50, in remission   Cancer Niece        unknown type behind her ear, dx. 30s/40s   Colon cancer Neg Hx      ROS:  Please see the history of present illness.   All other ROS reviewed and negative.     Physical Exam/Data:   Vitals:   10/16/22 0248 10/16/22 0559 10/16/22 1003 10/16/22 1006  BP: 115/60 (!) 129/52  134/62  Pulse: 67 68  68  Resp: 20 (!) 22    Temp: 98.2 F (36.8 C) 97.7 F (36.5 C) 98.1 F (36.7 C)   TempSrc: Oral Oral    SpO2: 95% 93%     No intake or output data in the 24 hours ending 10/16/22 1234    10/01/2022    5:00 AM 09/30/2022    5:00 AM 09/29/2022    5:00 AM  Last 3 Weights  Weight (lbs) 172 lb 2.9 oz 173 lb 4.5 oz 173 lb 4.5 oz  Weight (kg) 78.1 kg 78.6 kg 78.6 kg     There is no height or weight on file to calculate BMI.  General:  obese female in NAD, pale HEENT: normal Neck: + JVD Vascular: No carotid bruits; Distal pulses 2+ bilaterally Cardiac:  irregular rhythm, bradycardic rate, systolic murmur Lungs:  clear to auscultation bilaterally, no wheezing, rhonchi or rales  Abd: soft, nontender, no hepatomegaly  Ext: 3+ pitting edema - swelling to abdomen Musculoskeletal:  No deformities, BUE and BLE strength normal and equal Skin: warm and dry  Neuro:  CNs 2-12 intact, no focal abnormalities noted Psych:  Normal affect   EKG:  The EKG was personally reviewed and demonstrates:  Afib with VR 66 Telemetry:  Telemetry was personally  reviewed and demonstrates:  atrial fibrillation with VR 60s  Relevant CV Studies:  Echo 04/2022: 1. Left ventricular ejection fraction, by estimation, is 60 to 65%. The  left ventricle has normal function. The left ventricle has no regional  wall motion abnormalities. There is mild left ventricular hypertrophy.  Left ventricular diastolic function  could not be evaluated.   2. Right ventricular systolic function is normal. The right ventricular  size is normal. There is normal pulmonary artery systolic pressure. The  estimated right ventricular systolic pressure is 01.7 mmHg.   3. A small pericardial effusion is present. The pericardial effusion is  circumferential. There is no evidence of cardiac tamponade.   4. The mitral valve is abnormal. Trivial mitral valve regurgitation.   5. The aortic valve was not well visualized. There is mild calcification  of the aortic valve. Aortic valve regurgitation is not visualized. Mild to  moderate aortic  valve stenosis. Aortic valve area, by VTI measures 1.28  cm. Aortic valve mean gradient  measures 17.0 mmHg. Aortic valve Vmax measures 2.65 m/s. DI is 0.41.   6. The inferior vena cava is normal in size with <50% respiratory  variability, suggesting right atrial pressure of 8 mmHg.   Laboratory Data:  High Sensitivity Troponin:   Recent Labs  Lab 10/16/22 0342 10/16/22 0510  TROPONINIHS 10 12     Chemistry Recent Labs  Lab 10/16/22 0342  NA 140  K 4.4  CL 111  CO2 19*  GLUCOSE 90  BUN 78*  CREATININE 3.26*  CALCIUM 6.9*  GFRNONAA 14*  ANIONGAP 10    No results for input(s): "PROT", "ALBUMIN", "AST", "ALT", "ALKPHOS", "BILITOT" in the last 168 hours. Lipids No results for input(s): "CHOL", "TRIG", "HDL", "LABVLDL", "LDLCALC", "CHOLHDL" in the last 168 hours.  Hematology Recent Labs  Lab 10/16/22 0342  WBC 4.7  RBC 2.94*  HGB 9.2*  HCT 28.9*  MCV 98.3  MCH 31.3  MCHC 31.8  RDW 15.9*  PLT 136*   Thyroid No results  for input(s): "TSH", "FREET4" in the last 168 hours.  BNP Recent Labs  Lab 10/16/22 0342  BNP 1,198.5*    DDimer No results for input(s): "DDIMER" in the last 168 hours.   Radiology/Studies:  DG Chest 2 View  Result Date: 10/16/2022 CLINICAL DATA:  Shortness of breath EXAM: CHEST - 2 VIEW COMPARISON:  08/29/2022 FINDINGS: Redemonstrated right chest port with catheter tip overlying the superior cavoatrial junction. Cardiomegaly. Increased right pleural effusion, now moderate, with associated atelectasis. Trace left pleural effusion. No pneumothorax. No acute osseous abnormality. IMPRESSION: 1. Increased right pleural effusion, now moderate, with associated atelectasis. 2. Trace left pleural effusion. Electronically Signed   By: Merilyn Baba M.D.   On: 10/16/2022 03:15     Assessment and Plan:   Acute on chronic diastolic heart failure AKI on CKD 4 History of systolic heart failure with improved EF Hypertension Cirrhosis  GDMT includes: 25 mg Jardiance, 30 mg Imdur, 12.5 mg Toprol, 80 mg valsartan, and 40 mg torsemide - she has received 40 mg IV Lasix, no I&Os charted - Echocardiogram pending - sCr 3.26 - baseline near 1.7-1.8 - BNP 1200, CO2 19 - would hold ARB and jardiance for now - CrCl 17 - hold BB in the setting of decompensated CHF - not pressure room for imdur - consider bladder scan given hx of urinary retention and AKI, will also order a renal ultrasound - consider increasing lasix to 120 mg IV lasix to surmount renal function, but if bladder scan negative, would prefer to involve nephrology  Complex clinical situation given history of HFpEF, AoCKD and cirrhosis. Recommend nephrology consult.   Persistent atrial fibrillation Chronic anticoagulation On 2.5 mg Eliquis twice daily EKG with rate controlled Afib   CAD s/p DES RCA Hyperlipidemia with LDL goal less than 70 Plavix monotherapy in the setting of Eliquis Continue 40 mg Lipitor HS troponin x 2  negative   Chronic anemia Hemoglobin 9.2 - baseline 10-11   Disposition She was working and active until recent hip fracture. She will need patient assistance for eliquis. Will need PT.   Risk Assessment/Risk Scores:     New York Heart Association (NYHA) Functional Class NYHA Class IV  CHA2DS2-VASc Score = 6   This indicates a 9.7% annual risk of stroke. The patient's score is based upon: CHF History: 1 HTN History: 1 Diabetes History: 1 Stroke History: 0 Vascular Disease History:  0 Age Score: 2 Gender Score: 1     For questions or updates, please contact [place] Please consult [email or web address] for contact info under    Signed, Ledora Bottcher, [place]  10/16/2022 12:34 PM  Patient seen and examined with Doreene Adas PA.  Agree as above, with the following exceptions and changes as noted below.  Patient has been noted to have increased lower extremity edema over the last 1 to 2 months since discharge from the hospital with hip fracture in September and inpatient rehab stay.  She independently uptitrated her torsemide to 40 mg twice daily without benefit.  She was having difficulty ambulating due to lower extremity edema and discomfort.  She has acute kidney injury with a baseline creatinine of 1.6-1.7.  She has received 40 mg of IV Lasix and though her upper extremity swelling has improved, she remains significantly volume overloaded.  No shortness of breath greater than her usual but she has not been as active due to discomfort in her legs due to swelling gen: NAD, CV: RRR, no murmurs, Lungs: clear anteriorly, Abd: soft, Extrem: Warm, well perfused, 2-3+ edema, Neuro/Psych: alert and oriented x 3, normal mood and affect. All available labs, radiology testing, previous records reviewed.  Patient has an acutely elevated creatinine which may be cardiorenal in nature.  We will administer Lasix IV 120 mg for Lasix challenge.  If no significant output, consider nephrology  consultation.  Obtain renal ultrasound to exclude hydronephrosis or obstructive process.  She notes no increase in urine output with increasing doses of diuretic.  Bladder scan.  She is in atrial fibrillation on EKG but is rate controlled and continues on Eliquis.  Recommendations discussed with bedside nurse.  Cardiology will follow.  Elouise Munroe, MD 10/16/22 4:19 PM

## 2022-10-16 NOTE — ED Provider Triage Note (Signed)
Emergency Medicine Provider Triage Evaluation Note  [name] , a 81 y.o. female  was evaluated in triage.  Pt complains of SOB and LE edema.  Hospitalized 9/29 to 10/26 for CHF after femur fracture.  States since release she has had worsening swelling of LE despite taking her diuretic.  Denies chest pain.    Review of Systems  Positive: LE edema, SOB Negative: fever  Physical Exam  BP 115/60 (BP Location: Right Arm)   Pulse 67   Temp 98.2 F (36.8 C) (Oral)   Resp 20   SpO2 95%  Gen:   Awake, no distress   Resp:  Normal effort  MSK:   Moves extremities without difficulty  Other:  2+ pitting edema of BLE, grossly symmetric, some fluid weeping  Medical Decision Making  Medically screening exam initiated at 2:49 AM.  Appropriate orders placed.  [name] was informed that the remainder of the evaluation will be completed by another provider, this initial triage assessment does not replace that evaluation, and the importance of remaining in the ED until their evaluation is complete.  LE edema, SOB.  Recent hospitalization for CHF.  Will check EKG, labs, CXR.   Larene Pickett, PA-C 10/16/22 [phone]

## 2022-10-16 NOTE — ED Triage Notes (Signed)
Patient reports worsening edema at lower legs / feet this week with mild SOB , history of CHF .

## 2022-10-16 NOTE — Inpatient Diabetes Management (Signed)
Inpatient Diabetes Program Recommendations  AACE/ADA: New Consensus Statement on Inpatient Glycemic Control (2015)  Target Ranges:  Prepandial:   less than 140 mg/dL      Peak postprandial:   less than 180 mg/dL (1-2 hours)      Critically ill patients:  140 - 180 mg/dL   Lab Results  Component Value Date   GLUCAP 60 (L) 10/16/2022   HGBA1C 9.9 (A) 08/13/2022    Review of Glycemic Control  Latest Reference Range & Units 10/16/22 12:05  Glucose-Capillary 70 - 99 mg/dL 60 (L)   Diabetes history: DM 2 Outpatient Diabetes medications: Toujeo 16 units qhs, Novolog 10 units tid before meals (pt reports this as a sliding scale) Current orders for Inpatient glycemic control:  Novolog 0-15 units tid  A1c 9.9% on 9/6 at her Endocrinology appointment   Spoke with pt at bedside regarding her A1c and glucose control at home. Pt reports being on a sliding scalefor Novolog but says she takes her insulin everyday and checks her glucose up to 4 times a day. She reports having hypoglycemia approx 2 times a week and that her glucose tends are variable. Pt sees Dr. Kelton Pillar at [facility]. She sees her every 3 months. Her next appointment is in December. Encouraged pt to follow up with Dr. Kelton Pillar. Pt can not find a clear pattern with her glucose trends for obvious adjustments.   Thanks, Tama Headings RN, MSN, BC-ADM Inpatient Diabetes Coordinator Team Pager [phone] (8a-5p)

## 2022-10-17 DIAGNOSIS — I5033 Acute on chronic diastolic (congestive) heart failure: Secondary | ICD-10-CM | POA: Diagnosis not present

## 2022-10-17 LAB — BASIC METABOLIC PANEL
Anion gap: 8 (ref 5–15)
BUN: 79 mg/dL — ABNORMAL HIGH (ref 8–23)
CO2: 19 mmol/L — ABNORMAL LOW (ref 22–32)
Calcium: 6.6 mg/dL — ABNORMAL LOW (ref 8.9–10.3)
Chloride: 116 mmol/L — ABNORMAL HIGH (ref 98–111)
Creatinine, Ser: 3.01 mg/dL — ABNORMAL HIGH (ref 0.44–1.00)
GFR, Estimated: 15 mL/min — ABNORMAL LOW (ref >60)
Glucose, Bld: 122 mg/dL — ABNORMAL HIGH (ref 70–99)
Potassium: 4.4 mmol/L (ref 3.5–5.1)
Sodium: 143 mmol/L (ref 135–145)

## 2022-10-17 LAB — GLUCOSE, CAPILLARY
Glucose-Capillary: 166 mg/dL — ABNORMAL HIGH (ref 70–99)
Glucose-Capillary: 100 mg/dL — ABNORMAL HIGH (ref 70–99)
Glucose-Capillary: 191 mg/dL — ABNORMAL HIGH (ref 70–99)
Glucose-Capillary: 187 mg/dL — ABNORMAL HIGH (ref 70–99)
Glucose-Capillary: 201 mg/dL — ABNORMAL HIGH (ref 70–99)

## 2022-10-17 LAB — CBC
HCT: 28.1 % — ABNORMAL LOW (ref 36.0–46.0)
Hemoglobin: 9 g/dL — ABNORMAL LOW (ref 12.0–15.0)
MCH: 30.9 pg (ref 26.0–34.0)
MCHC: 32 g/dL (ref 30.0–36.0)
MCV: 96.6 fL (ref 80.0–100.0)
Platelets: 122 10*3/uL — ABNORMAL LOW (ref 150–400)
RBC: 2.91 MIL/uL — ABNORMAL LOW (ref 3.87–5.11)
RDW: 15.7 % — ABNORMAL HIGH (ref 11.5–15.5)
WBC: 4.3 10*3/uL (ref 4.0–10.5)
nRBC: 0 % (ref 0.0–0.2)

## 2022-10-17 MED ORDER — ISOSORBIDE MONONITRATE ER 30 MG PO TB24
15.0000 mg | ORAL_TABLET | Freq: Every day | ORAL | Status: DC
  Administered 2022-10-17: 15 mg via ORAL
  Filled 2022-10-17: qty 1

## 2022-10-17 MED ORDER — ALBUMIN HUMAN 25 % IV SOLN
25.0000 g | Freq: Four times a day (QID) | INTRAVENOUS | Status: AC
  Administered 2022-10-17 (×2): 25 g via INTRAVENOUS
  Filled 2022-10-17 (×2): qty 100

## 2022-10-17 MED ORDER — FUROSEMIDE 10 MG/ML IJ SOLN
80.0000 mg | Freq: Two times a day (BID) | INTRAMUSCULAR | Status: DC
  Administered 2022-10-18: 80 mg via INTRAVENOUS
  Filled 2022-10-17: qty 8

## 2022-10-17 MED ORDER — FUROSEMIDE 10 MG/ML IJ SOLN
40.0000 mg | Freq: Once | INTRAMUSCULAR | Status: AC
  Administered 2022-10-17: 40 mg via INTRAVENOUS
  Filled 2022-10-17: qty 4

## 2022-10-17 MED ORDER — ENSURE ENLIVE PO LIQD
237.0000 mL | Freq: Two times a day (BID) | ORAL | Status: DC
  Administered 2022-10-17 – 2022-10-21 (×5): 237 mL via ORAL

## 2022-10-17 MED ORDER — FUROSEMIDE 10 MG/ML IJ SOLN: 80.0000 mg | Freq: Two times a day (BID) | INTRAMUSCULAR | Status: DC

## 2022-10-17 MED ORDER — FUROSEMIDE 10 MG/ML IJ SOLN
120.0000 mg | Freq: Once | INTRAVENOUS | Status: AC
  Administered 2022-10-17: 120 mg via INTRAVENOUS
  Filled 2022-10-17: qty 12

## 2022-10-17 NOTE — TOC Initial Note (Addendum)
Transition of Care [facility]) - Initial/Assessment Note    Patient Details  Name: [name] MRN: [id number] Date of Birth: [birth year]  Transition of Care [facility]) CM/SW Contact:    Tom-Johnson, Renea Ee, RN Phone Number: 10/17/2022, 3:42 PM  Clinical Narrative:                  Patient admitted for Acute on Chronic Diastolic CHF. CM consulted for home health PT/OT/RN/Aide. Patient has Generic Worker's Charity fundraiser.  CM spoke with patient at bedside and she requested CM to speak with daughter, [name]. CM called and spoke with [name] who told CM that patient's Worker's Comp claim has ben authorized Neurosurgeon # [id number]) and CM can call to verify. CM contacted Lanney Gins [phone]), patient's claim adjuster who states patient's claim has been authorized. Mickel Baas referred CM to [name] [phone]), patient's Social Worker and she requested to send a copy of the home health order. Order faxed to [phone]) and also emailed to [email or web address] with successful notice received.  Erline Levine states patient is already active with interim Home health with PT discipline and scheduled at [facility] for outpatient OT but needs agency for OT/RN/Aide. States she will be in contact with CM with updates.  CM will continue to follow as patient progresses with care towards discharge.      Expected Discharge Plan: [place] Barriers to Discharge: Continued Medical Work up   Patient Goals and CMS Choice Patient states their goals for this hospitalization and ongoing recovery are:: To return home   Choice offered to / list presented to : Patient, Adult Children (Daughter, [name])  Expected Discharge Plan and Services Expected Discharge Plan: [place]   Discharge Planning Services: CM Consult Post Acute Care Choice: [place] arrangements for the past 2 months: Single Family Home                           HH Arranged: PT, OT, RN,  Nurse's Aide [place] Agency: Other - See comment (Through [place]) Date HH Agency Contacted: 10/17/22 Time HH Agency Contacted: 2423 Representative spoke with at [place]: [name] and Lanney Gins  Prior Living Arrangements/Services Living arrangements for the past 2 months: Single Family Home Lives with:: Spouse Patient language and need for interpreter reviewed:: Yes Do you feel safe going back to the place where you live?: Yes      Need for Family Participation in Patient Care: Yes (Comment) Care giver support system in place?: Yes (comment)   Criminal Activity/Legal Involvement Pertinent to Current Situation/Hospitalization: No - Comment as needed  Activities of Daily Living Home Assistive Devices/Equipment: Gilford Rile (specify type) (front wheeled) ADL Screening (condition at time of admission) Patient's cognitive ability adequate to safely complete daily activities?: Yes Is the patient deaf or have difficulty hearing?: No Does the patient have difficulty seeing, even when wearing glasses/contacts?: No Does the patient have difficulty concentrating, remembering, or making decisions?: No Patient able to express need for assistance with ADLs?: No Does the patient have difficulty dressing or bathing?: No Independently performs ADLs?: Yes (appropriate for developmental age) Does the patient have difficulty walking or climbing stairs?: Yes Weakness of Legs: Both Weakness of Arms/Hands: None  Permission Sought/Granted Permission sought to share information with : Case Manager, Customer service manager, Family Supports Permission granted to share information with : Yes, Verbal Permission Granted  Emotional Assessment Appearance:: Appears stated age Attitude/Demeanor/Rapport: Engaged Affect (typically observed): Accepting, Calm, Hopeful Orientation: : Oriented to Self, Oriented to Place, Oriented to Situation Alcohol / Substance  Use: Not Applicable Psych Involvement: No (comment)  Admission diagnosis:  Acute on chronic combined systolic and diastolic heart failure ([place]) [I50.43] AKI (acute kidney injury) ([place]) [N17.9] Acute on chronic diastolic (congestive) heart failure (HCC) [I50.33] Patient Active Problem List   Diagnosis Date Noted   Acute on chronic diastolic (congestive) heart failure ([place]) 10/16/2022   Chronic pain disorder 10/16/2022   Intertrochanteric fracture of right hip ([place]) 09/05/2022   Acute urinary retention 09/01/2022   Hip fracture ([place]) 08/29/2022   Paroxysmal atrial fibrillation (HCC)    Abdominal pain 04/17/2022   Pulmonary nodule 04/17/2022   DNR (do not resuscitate) 04/17/2022   Acute renal failure superimposed on stage 4 chronic kidney disease ([place]) 12/06/2021   Hyperglycemia 12/06/2021   Seizure ([place]) 12/05/2021   Acute on chronic diastolic CHF (congestive heart failure) ([place]) 05/30/2020   Genetic testing 12/29/2019   Family history of esophageal cancer    Family history of lung cancer    Family history of cervical cancer    Family history of skin cancer    Family history of leukemia    History of uterine cancer    Carcinoma of upper-outer quadrant of left breast in female, estrogen receptor positive ([place]) 10/17/2019   Type 2 diabetes mellitus with hyperglycemia, with long-term current use of insulin ([place]) 05/04/2019   Type 2 diabetes mellitus with stage 3 chronic kidney disease, with long-term current use of insulin ([place]) 05/04/2019   Diabetes mellitus ([place]) 05/04/2019   Morbid (severe) obesity due to excess calories ([place]) 10/27/2018   Localization-related (focal) (partial) symptomatic epilepsy and epileptic syndromes with complex partial seizures, not intractable, without status epilepticus ([place]) 10/27/2018   Expressive aphasia 11/21/2017   Hyponatremia 11/21/2017   TIA (transient ischemic attack)    Chest pain 09/24/2017   History of non-ST elevation myocardial infarction  (NSTEMI)    Chronic combined systolic and diastolic heart failure ([place])    Vitamin B12 deficiency 10/29/2016   Port catheter in place 05/30/2016   Other pancytopenia ([place]) 08/09/2014   Leukemia, acute ([place]) 06/08/2014   History of TIA (transient ischemic attack) 05/24/2014   CKD (chronic kidney disease) stage 4, GFR 15-29 ml/min ([place]) 05/24/2014   AML (acute myeloblastic leukemia) ([place]) 05/24/2014   Acute leukemia ([place]) 03/02/2014   Cervical spine pain 02/24/2014   Alkaline phosphatase elevation 02/24/2014   Chronic back pain 02/13/2014   PAF- flutter 02/04/2014   Pancreatitis 01/29/2014   Lesion of pancreas 01/29/2014   HIP PAIN, LEFT 01/26/2008   Hyperlipidemia 12/24/2007   CAD (coronary artery disease) 12/24/2007   CHEST PAIN, ATYPICAL 12/24/2007   NEPHROLITHIASIS, HX OF 12/24/2007   Insulin dependent diabetes mellitus 07/13/2007   Essential hypertension 07/13/2007   PCP:  Isaac Bliss, Rayford Halsted, MD Pharmacy:   [facility] [phone] - [place], [place] - [address] AT [place] [address] [place] [address] Phone: [phone] Fax: [phone]  [facility] (New Address) - [place], [place] - [address] AT Previously: [address] [place] [address] Phone: [phone] Fax: [phone]  [facility] - [place], [place] [address] Phone: [phone] Fax: [phone]     Social Determinants of  Health (SDOH) Interventions Food Insecurity Interventions: Intervention Not Indicated Housing Interventions: Intervention Not Indicated Utilities Interventions: Intervention Not Indicated  Readmission Risk Interventions     No data to display

## 2022-10-17 NOTE — Progress Notes (Signed)
   Heart Failure Stewardship Pharmacist Progress Note   PCP: Isaac Bliss, Rayford Halsted, MD PCP-Cardiologist: Mertie Moores, MD    HPI:  [name] is an 81 year old female with a PMH of CHF, NSTEMI s/p PCI to RCA, atrial flutter, AML, COPD, CKD, hypertension, hyperlipidemia and hepatic failure who presented with worsening edema and shortness of breath. She was recently discharged from rehab, reports symptom worsening despite adherence to diuretic regimen. In 08/2017, echocardiogram showed LVEF of 40-45% which has since improved to 60-65% on echocardiogram from 04/2022. Patient has previously tried Delene Loll, but it was stopped for 'itching.' AKI present on admission.  Today the patient is diffusely edematous on exam. She is not having dyspnea at rest. She reports minimal urine output thus far, I/Os are not currently being charted. She complains of left heel pain.   Current HF Medications: Diuretic: Lasix 40 mg IV BID Beta Blocker: metoprolol succinate 12.5 mg daily Other: Imdur 30 mg daily  Prior to admission HF Medications: Diuretic: torsemide 40 mg daily  Beta blocker: metoprolol succinate 12.5 mg daily ACE/ARB/ARNI: valsartan 80 mg daily SGLT2i: Jardiance 25 mg daily Other: Imdur 30 mg daily  Pertinent Lab Values: Serum creatinine 3.01, BUN 79, Potassium 4.4, Sodium 143, BNP 1198.5, Magnesium 2.1, A1c 9.9   Vital Signs: Weight: 171.5 lbs (admission weight: 172.2 lbs) Blood pressure: labile, average 120/70s  Heart rate: 60s  I/O: -not reported  Medication Assistance / Insurance Benefits Check: Does the patient have prescription insurance?  Yes Type of insurance plan: Medicare  Does the patient qualify for medication assistance through manufacturers or grants?   Pending Eligible grants and/or patient assistance programs: pending Medication assistance applications in progress: pending  Medication assistance applications approved: pending Approved medication assistance  renewals will be completed by: pending  Outpatient Pharmacy:  Prior to admission outpatient pharmacy: [facility] Is the patient willing to use [facility] at discharge? Yes Is the patient willing to transition their outpatient pharmacy to utilize a [facility] outpatient pharmacy?   Pending    Assessment: 1. Acute on chronic diastolic HFimpEF (LVEF 81-01%), due to ICM. NYHA class III symptoms. - Patient needs strict I/Os along with daily weights. Maintain K >4 and Mg >2. - Scr has improved with mild diuresis. Agree with continuing to hold GDMT for now. Patient has had minimal urine output per nurse report on 40 mg BID of IV Lasix. Agree with increasing to 80 mg BID - BP is labile, will hold on afterload reduction at this time. If BP is consistently elevated in the future can consider hydralazine while AKI present.   Plan: 1) Medication changes recommended at this time: - Agree with increasing to Lasix 80 mg BID - If urine output it not improved today, recommend increasing to 120 mg tomorrow - Reinforce strict I/Os  2) Patient assistance: - Pending  3)  Education  - Initial education done. To be completed prior to discharge  Thank you for allowing pharmacy to participate in this patient's care.  Reatha Harps, PharmD PGY2 Pharmacy Resident 10/17/2022 8:27 AM Check [email or web address] for unit specific pharmacy number

## 2022-10-17 NOTE — Progress Notes (Signed)
Initial Nutrition Assessment  DOCUMENTATION CODES:   Not applicable  INTERVENTION:  - Heart Healthy/Carb Modified diet with 1535m fluid restriction and 2g Na restriction per MD.    - If intake is poor recommend liberalizing to Regular as medically appropriate.  - Ensure Enlive BID to support intake, supplement provides 350 calories and 20g protein   NUTRITION DIAGNOSIS:   Increased nutrient needs related to chronic illness (COPD, CHF, CKD) as evidenced by estimated needs.  GOAL:   Patient will meet greater than or equal to 90% of their needs   MONITOR:   PO intake, Supplement acceptance  REASON FOR ASSESSMENT:   Consult Other (Comment) (nutritional goals)  ASSESSMENT:   81y.o. female with PMH AML (remission), COPD, CKD, CAD, DMII, HTN, HLD, CHF who presented with edema. Noted to have recent hip fx.  Patient reports she is in a lot of pain at time of visit. Inquired what was hurting and patient reported "everything". Able to state a UBW of 150# and that her current weight is up due to fluid. Denies weight changes other than fluid. Per chart review, weight without significant changes from January to September, at which point weight increased significantly, likely due to fluid. Reports eating well PTA, typically has 3 meals a day.  Current appetite is also good. No intake documented yet this admission. Stressed importance of eating well during admission to maintain strength.  Unable to perform NFPE due to patient's pain. Will attempt at next visit.  Medications reviewed and include: Colace, Miralax, Lasix, Insulin,   Labs reviewed:  Creatinine 3.01 (H) HA1C 9.9 BS 60-166 since admit yesterday   NUTRITION - FOCUSED PHYSICAL EXAM: Unable to perform as patient reported to be in severe pain during visit  Diet Order:   Diet Order             Diet heart healthy/carb modified Room service appropriate? Yes; Fluid consistency: Thin; Fluid restriction: 1500 mL Fluid  Diet  effective now                   EDUCATION NEEDS:   No education needs have been identified at this time  Skin:  Skin Assessment: Reviewed RN Assessment  Last BM:  11/9  Height:  Ht Readings from Last 1 Encounters:  10/16/22 '5\' 2"'$  (1.575 m)   Weight:  Wt Readings from Last 1 Encounters:  10/17/22 77.8 kg  Dry weight (prior to edema): 68kg  Ideal Body Weight:  50 kg  BMI:  Body mass index is 31.37 kg/m.  Estimated Nutritional Needs:  Kcal:  1700-1900 Protein:  80-100 grams Fluid:  >/= 1.7L    ASamson FredericRD, LDN For contact information, refer to [facility]

## 2022-10-17 NOTE — Progress Notes (Addendum)
Rounding Note    Patient Name: [name] Date of Encounter: 10/17/2022  [place] Cardiologist: Mertie Moores, MD   Subjective   Denies any chest pain, still short of breath.    Inpatient Medications    Scheduled Meds:  apixaban  2.5 mg Oral BID   atorvastatin  40 mg Oral Daily   Chlorhexidine Gluconate Cloth  6 each Topical Daily   clopidogrel  75 mg Oral QPM   docusate sodium  100 mg Oral BID   furosemide  80 mg Intravenous BID   insulin aspart  0-15 Units Subcutaneous TID WC   insulin aspart  0-5 Units Subcutaneous QHS   letrozole  2.5 mg Oral Daily   levETIRAcetam  500 mg Oral BID   melatonin  3 mg Oral QHS   metoprolol succinate  12.5 mg Oral Daily   polyethylene glycol  17 g Oral Daily   psyllium  1 packet Oral Daily   sodium bicarbonate  650 mg Oral BID   sodium chloride flush  3 mL Intravenous Q12H   Continuous Infusions:  albumin human 25 g (10/17/22 1012)   PRN Meds: acetaminophen **OR** acetaminophen, bisacodyl, fluticasone furoate-vilanterol, hydrALAZINE, LORazepam, methocarbamol, ondansetron **OR** ondansetron (ZOFRAN) IV, oxyCODONE, traZODone   Vital Signs    Vitals:   10/16/22 2104 10/17/22 0052 10/17/22 0400 10/17/22 0714  BP: 134/89 120/73 (!) 137/51 (!) 113/97  Pulse: 61 (!) 56 68 71  Resp: '17 16  20  '$ Temp: 97.7 F (36.5 C) (!) 97.5 F (36.4 C) 98.7 F (37.1 C) 97.6 F (36.4 C)  TempSrc: Oral Oral Oral Oral  SpO2: 100% 100%  93%  Weight: 78.1 kg  77.8 kg   Height: '5\' 2"'$  (1.575 m)       Intake/Output Summary (Last 24 hours) at 10/17/2022 1112 Last data filed at 10/17/2022 0500 Gross per 24 hour  Intake 483 ml  Output --  Net 483 ml      10/17/2022    4:00 AM 10/16/2022    9:04 PM 10/01/2022    5:00 AM  Last 3 Weights  Weight (lbs) 171 lb 8 oz 172 lb 2.9 oz 172 lb 2.9 oz  Weight (kg) 77.792 kg 78.1 kg 78.1 kg      Telemetry    Atrial fibrillation, rate controlled - Personally Reviewed  ECG    Atrial  fibrillation - Personally Reviewed  Physical Exam   GEN: No acute distress.   Neck: No JVD Cardiac: irregular, no murmurs, rubs, or gallops.  Respiratory: diminished breath sound in the right base.  GI: Soft, nontender, non-distended  MS: No edema; No deformity. Neuro:  Nonfocal  Psych: Normal affect   Labs    High Sensitivity Troponin:   Recent Labs  Lab 10/16/22 0342 10/16/22 0510  TROPONINIHS 10 12     Chemistry Recent Labs  Lab 10/16/22 0342 10/16/22 0510 10/17/22 0315  NA 140  --  143  K 4.4  --  4.4  CL 111  --  116*  CO2 19*  --  19*  GLUCOSE 90  --  122*  BUN 78*  --  79*  CREATININE 3.26*  --  3.01*  CALCIUM 6.9*  --  6.6*  PROT  --  5.6*  --   ALBUMIN  --  2.8*  --   AST  --  19  --   ALT  --  9  --   ALKPHOS  --  116  --  BILITOT  --  0.6  --   GFRNONAA 14*  --  15*  ANIONGAP 10  --  8    Lipids No results for input(s): "CHOL", "TRIG", "HDL", "LABVLDL", "LDLCALC", "CHOLHDL" in the last 168 hours.  Hematology Recent Labs  Lab 10/16/22 0342 10/17/22 0315  WBC 4.7 4.3  RBC 2.94* 2.91*  HGB 9.2* 9.0*  HCT 28.9* 28.1*  MCV 98.3 96.6  MCH 31.3 30.9  MCHC 31.8 32.0  RDW 15.9* 15.7*  PLT 136* 122*   Thyroid No results for input(s): "TSH", "FREET4" in the last 168 hours.  BNP Recent Labs  Lab 10/16/22 0342  BNP 1,198.5*    DDimer No results for input(s): "DDIMER" in the last 168 hours.   Radiology    US RENAL  Result Date: 10/16/2022 CLINICAL DATA:  AK I EXAM: RENAL / URINARY TRACT ULTRASOUND COMPLETE COMPARISON:  Renal ultrasound dated 08/31/2022 FINDINGS: Right Kidney: Renal measurements: 10.3 x 5.2 x 4.0 cm = volume: 109.8 mL (previously 160 ml). Echogenicity is slightly increased. No hydronephrosis visualized. Within the right kidney there is a 1.4 cm anechoic lesion, compatible with a simple renal cyst, and unchanged from prior exam. Left Kidney: Renal measurements: 9.3 x 4.6 x 4.3 cm = volume: 95.5 mL (previously 145 ml). Echogenicity  is slightly increased. No hydronephrosis visualized. Within left kidney, there are newly visualized anechoic lesions along the upper pole measuring up to 1.1 cm and 0.7 cm, which is also likely represent renal cysts and likely poorly visualized on prior exam due to poor visualization of the left kidney. Bladder: Appears normal for degree of bladder distention. Other: Incidentally noted are bilateral pleural effusions and possible small amount of perihepatic ascites. IMPRESSION: 1. No hydronephrosis or nephrolithiasis. 2. Findings suggestive of medical renal disease. 3. Incidentally noted are small bilateral pleural effusions and possible perihepatic ascites, new from prior. Electronically Signed   By: Marin Roberts M.D.   On: 10/16/2022 17:41   ECHOCARDIOGRAM COMPLETE  Result Date: 10/16/2022    ECHOCARDIOGRAM REPORT   Patient Name:   [name] Date of Exam: 10/16/2022 Medical Rec #:  [id number]     Height:       62.0 in Accession #:    [id number]    Weight:       172.2 lb Date of Birth:  1941/06/21     BSA:          1.794 m Patient Age:    81 years      BP:           131/66 mmHg Patient Gender: F             HR:           50 bpm. Exam Location:  Inpatient Procedure: 2D Echo, 3D Echo, Cardiac Doppler and Color Doppler Indications:    I50.40* Unspecified combined systolic (congestive) and diastolic                 (congestive) heart failure  History:        Patient has prior history of Echocardiogram examinations, most                 recent 04/17/2022. CHF, CAD, Abnormal ECG, TIA, Aortic Valve                 Disease, Arrythmias:Atrial Fibrillation and Atrial Flutter,                 Signs/Symptoms:Chest Pain; Risk Factors:Hypertension,  Dyslipidemia and Diabetes. Aortic stenosis. Breast cancer.                 Seizure.  Sonographer:    Roseanna Rainbow RDCS Referring Phys: [id number] JENNIFER YATES  Sonographer Comments: Technically difficult study due to poor echo windows. Image acquisition challenging due  to patient body habitus. IMPRESSIONS  1. Left ventricular ejection fraction, by estimation, is 60 to 65%. The left ventricle has normal function. The left ventricle has no regional wall motion abnormalities. There is mild left ventricular hypertrophy. Left ventricular diastolic parameters are indeterminate.  2. Right ventricular systolic function is normal. The right ventricular size is normal. There is mildly elevated pulmonary artery systolic pressure.  3. Left atrial size was moderately dilated.  4. The mitral valve is degenerative. Mild mitral valve regurgitation. No evidence of mitral stenosis. Severe mitral annular calcification.  5. Tricuspid valve regurgitation is moderate to severe.  6. The aortic valve is normal in structure. There is severe calcifcation of the aortic valve. There is severe thickening of the aortic valve. Aortic valve regurgitation is not visualized. Mild to moderate aortic valve stenosis.  7. The inferior vena cava is normal in size with greater than 50% respiratory variability, suggesting right atrial pressure of 3 mmHg. FINDINGS  Left Ventricle: Left ventricular ejection fraction, by estimation, is 60 to 65%. The left ventricle has normal function. The left ventricle has no regional wall motion abnormalities. The left ventricular internal cavity size was normal in size. There is  mild left ventricular hypertrophy. Left ventricular diastolic parameters are indeterminate. Right Ventricle: The right ventricular size is normal. No increase in right ventricular wall thickness. Right ventricular systolic function is normal. There is mildly elevated pulmonary artery systolic pressure. The tricuspid regurgitant velocity is 2.87  m/s, and with an assumed right atrial pressure of 8 mmHg, the estimated right ventricular systolic pressure is 26.3 mmHg. Left Atrium: Left atrial size was moderately dilated. Right Atrium: Right atrial size was normal in size. Pericardium: Trivial pericardial effusion  is present. The pericardial effusion is posterior to the left ventricle. Mitral Valve: The mitral valve is degenerative in appearance. There is severe thickening of the mitral valve leaflet(s). There is severe calcification of the mitral valve leaflet(s). Severe mitral annular calcification. Mild mitral valve regurgitation. No evidence of mitral valve stenosis. Tricuspid Valve: The tricuspid valve is normal in structure. Tricuspid valve regurgitation is moderate to severe. No evidence of tricuspid stenosis. Aortic Valve: The aortic valve is normal in structure. There is severe calcifcation of the aortic valve. There is severe thickening of the aortic valve. Aortic valve regurgitation is not visualized. Mild to moderate aortic stenosis is present. Aortic valve mean gradient measures 12.0 mmHg. Aortic valve peak gradient measures 21.3 mmHg. Aortic valve area, by VTI measures 1.01 cm. Pulmonic Valve: The pulmonic valve was normal in structure. Pulmonic valve regurgitation is trivial. No evidence of pulmonic stenosis. Aorta: The aortic root is normal in size and structure. Venous: The inferior vena cava is normal in size with greater than 50% respiratory variability, suggesting right atrial pressure of 3 mmHg. IAS/Shunts: The interatrial septum appears to be lipomatous. No atrial level shunt detected by color flow Doppler.  LEFT VENTRICLE PLAX 2D LVIDd:         4.00 cm LVIDs:         2.90 cm LV PW:         1.30 cm LV IVS:        1.30 cm LVOT diam:  1.70 cm     3D Volume EF: LV SV:         57          3D EF:        51 % LV SV Index:   32          LV EDV:       125 ml LVOT Area:     2.27 cm    LV ESV:       62 ml                            LV SV:        64 ml  LV Volumes (MOD) LV vol d, MOD A2C: 83.5 ml LV vol d, MOD A4C: 54.1 ml LV vol s, MOD A2C: 16.6 ml LV vol s, MOD A4C: 16.4 ml LV SV MOD A2C:     66.9 ml LV SV MOD A4C:     54.1 ml LV SV MOD BP:      49.8 ml RIGHT VENTRICLE            IVC RV S prime:     8.05  cm/s  IVC diam: 2.20 cm TAPSE (M-mode): 1.9 cm LEFT ATRIUM             Index        RIGHT ATRIUM           Index LA diam:        4.30 cm 2.40 cm/m   RA Area:     13.60 cm LA Vol (A2C):   60.2 ml 33.56 ml/m  RA Volume:   29.70 ml  16.56 ml/m LA Vol (A4C):   64.7 ml 36.07 ml/m LA Biplane Vol: 62.4 ml 34.79 ml/m  AORTIC VALVE AV Area (Vmax):    0.90 cm AV Area (Vmean):   0.88 cm AV Area (VTI):     1.01 cm AV Vmax:           230.80 cm/s AV Vmean:          159.800 cm/s AV VTI:            0.567 m AV Peak Grad:      21.3 mmHg AV Mean Grad:      12.0 mmHg LVOT Vmax:         91.80 cm/s LVOT Vmean:        62.300 cm/s LVOT VTI:          0.252 m LVOT/AV VTI ratio: 0.44  AORTA Ao Root diam: 3.00 cm Ao Asc diam:  3.00 cm MITRAL VALVE                TRICUSPID VALVE MV Area (PHT): 4.24 cm     TR Peak grad:   32.9 mmHg MV Decel Time: 179 msec     TR Vmax:        287.00 cm/s MV E velocity: 118.00 cm/s                             SHUNTS                             Systemic VTI:  0.25 m  Systemic Diam: 1.70 cm Jenkins Rouge MD Electronically signed by Jenkins Rouge MD Signature Date/Time: 10/16/2022/3:54:03 PM    Final    DG Chest 2 View  Result Date: 10/16/2022 CLINICAL DATA:  Shortness of breath EXAM: CHEST - 2 VIEW COMPARISON:  08/29/2022 FINDINGS: Redemonstrated right chest port with catheter tip overlying the superior cavoatrial junction. Cardiomegaly. Increased right pleural effusion, now moderate, with associated atelectasis. Trace left pleural effusion. No pneumothorax. No acute osseous abnormality. IMPRESSION: 1. Increased right pleural effusion, now moderate, with associated atelectasis. 2. Trace left pleural effusion. Electronically Signed   By: Merilyn Baba M.D.   On: 10/16/2022 03:15    Cardiac Studies   Echo 10/16/2022  1. Left ventricular ejection fraction, by estimation, is 60 to 65%. The  left ventricle has normal function. The left ventricle has no regional  wall motion  abnormalities. There is mild left ventricular hypertrophy.  Left ventricular diastolic parameters  are indeterminate.   2. Right ventricular systolic function is normal. The right ventricular  size is normal. There is mildly elevated pulmonary artery systolic  pressure.   3. Left atrial size was moderately dilated.   4. The mitral valve is degenerative. Mild mitral valve regurgitation. No  evidence of mitral stenosis. Severe mitral annular calcification.   5. Tricuspid valve regurgitation is moderate to severe.   6. The aortic valve is normal in structure. There is severe calcifcation  of the aortic valve. There is severe thickening of the aortic valve.  Aortic valve regurgitation is not visualized. Mild to moderate aortic  valve stenosis.   7. The inferior vena cava is normal in size with greater than 50%  respiratory variability, suggesting right atrial pressure of 3 mmHg.    Patient Profile     81 y.o. female with PMH of CAD s/p PCI-RCA (not CABG candidate), chronic diastolic CHF, HTN, HLD, atrial flutter, COPD, AML in remission, breast CA, uterine CA with possible mets on recent imaging, CKD III and cirrhosis presented with worsening LE edema  Assessment & Plan    Acute on chronic diastolic heart failure/LE edema  - Echocardiogram 10/16/2022 EF 60-65%, mildly elevated PASP, mild MR, moderate to severe TR, mild to moderate AS  - Renal doppler 10/16/2022 showed no hydronephrosis or nephrolithiasis, medical renal disease  - LE edema is likely multifactorial with hypoalbuminemia, moderate to severe TR and cirrhosis  - minimal urine output overnight on IV lasix. Previous office weight on 08/21/2022 was around 150 lbs, when she saw Dr. Acie Fredrickson in June 2023, she was 160 lbs. Did not have significant urine output on '40mg'$  BID, switched to '80mg'$  this afternoon. Will give additional '40mg'$  now.   R pleural effusion: at least moderate pleural effusion on the right side, likely contributing to her SOB,  repeat CXR tomorrow AM, may need thoracentesis  Acute on chronic renal insufficiency: previous baseline 1.8, came in with Cr of 3.26. Home jardiance, valsartan and torsemide held.   Hypertension  HLD  Persistent atrial fibrillation: on Eliquis. Rate controlled.       For questions or updates, please contact [place] Please consult [email or web address] for contact info under        Signed, Almyra Deforest, [place]  10/17/2022, 11:12 AM    Patient seen and examined with Almyra Deforest PA.  Agree as above, with the following exceptions and changes as noted below. Feeling better but L heel hurting. Gen: NAD, CV: RRR, Lungs: clear, Abd: soft, Extrem: Warm, well perfused, 1+ edema, Neuro/Psych: alert and  oriented x 3, normal mood and affect. All available labs, radiology testing, previous records reviewed. I/O not complete but kidney function improved suggesting she diuresed well, she concurs. Feels better. Redose lasix 120 mg IV bolus now, recognizing 40 mg has been given twice today. I don't think this will be effective at her current creatinine.  Reassess tomorrow.   Elouise Munroe, MD 10/17/22 1:30 PM

## 2022-10-17 NOTE — Progress Notes (Signed)
Heart Failure Navigator Progress Note  Following this hospitalization to assess for HV TOC readiness.   EF 60-65% IV Diuresis over weekend CKD , watch  Earnestine Leys, BSN, RN Heart Failure Leisure centre manager Chat Only

## 2022-10-17 NOTE — Evaluation (Signed)
Occupational Therapy Evaluation Patient Details Name: [name] MRN: [id number] DOB: December 13, 1940 Today's Date: 10/17/2022   History of Present Illness The pt is an 81 yo female presenting 11/9 with SOB and LE edema, found to have CHF exacerbation. Pt with recent admission for hip fx, d/c from inpatient rehab 10/26. PMH includes: arthritis, cardiomyopathy, CKD IV, CHF, COPD, CAD, DM II, HLD, HTN, and NSTEMI.   Clinical Impression   Prior to this admission, patient living alone and using RW for household distances. Endorses poor activity tolerance and fatigue. Currently, patient is presenting with decreased activity tolerance, increased work of breathing, and need for increased assist with ADLs. Patient is min-mod A for ADLs and transfers (especially if from low surface such as toilet). Patient with increased work of breathing after ambulating to the bathroom, however vital signs stable on room air. OT recommending [place] services at discharge; will continue to follow acutely.      Recommendations for follow up therapy are one component of a multi-disciplinary discharge planning process, led by the attending physician.  Recommendations may be updated based on patient status, additional functional criteria and insurance authorization.   Follow Up Recommendations  Home health OT    Assistance Recommended at Discharge Set up Supervision/Assistance  Patient can return home with the following A little help with walking and/or transfers;A little help with bathing/dressing/bathroom;Assistance with cooking/housework;Help with stairs or ramp for entrance;Assist for transportation    Functional Status Assessment  Patient has had a recent decline in their functional status and demonstrates the ability to make significant improvements in function in a reasonable and predictable amount of time.  Equipment Recommendations  None recommended by OT (Patient has DME needed)    Recommendations for Other  Services       Precautions / Restrictions Precautions Precautions: Fall Precaution Comments: monitor SpO2 to maintain >92%, hx of seizure disorder, no R LE ROM restrictions Restrictions Weight Bearing Restrictions: No RLE Weight Bearing: Weight bearing as tolerated      Mobility Bed Mobility Overal bed mobility: Needs Assistance Bed Mobility: Sit to Supine       Sit to supine: Min assist   General bed mobility comments: minA to bring LE into bed    Transfers Overall transfer level: Needs assistance Equipment used: Rolling walker (2 wheels) Transfers: Sit to/from Stand Sit to Stand: Min guard, Min assist           General transfer comment: min A from EOB, minA from low toilet      Balance Overall balance assessment: Needs assistance Sitting-balance support: No upper extremity supported, Feet supported Sitting balance-Leahy Scale: Fair     Standing balance support: Bilateral upper extremity supported, During functional activity Standing balance-Leahy Scale: Fair Standing balance comment: can static stand without UE support, BUE support for gait                           ADL either performed or assessed with clinical judgement   ADL Overall ADL's : Needs assistance/impaired Eating/Feeding: Set up;Sitting   Grooming: Set up;Sitting   Upper Body Bathing: Minimal assistance;Sitting   Lower Body Bathing: Moderate assistance;Sitting/lateral leans;Sit to/from stand   Upper Body Dressing : Minimal assistance;Sitting   Lower Body Dressing: Moderate assistance;Sitting/lateral leans;Sit to/from stand   Toilet Transfer: Minimal assistance;Moderate assistance;Ambulation;Rolling walker (2 wheels)   Toileting- Clothing Manipulation and Hygiene: Min guard;Sitting/lateral lean;Sit to/from stand       Functional mobility during ADLs: Minimal assistance;Cueing  for sequencing;Cueing for safety;Rolling walker (2 wheels) General ADL Comments: Patient  presenting with decreased activity tolerance, increased work of breathing, and need for increased assist with ADLs     Vision Baseline Vision/History: 1 Wears glasses Ability to See in Adequate Light: 0 Adequate Patient Visual Report: No change from baseline       Perception     Praxis      Pertinent Vitals/Pain Pain Assessment Faces Pain Scale: Hurts a little bit Pain Location: L heel, R hip Pain Descriptors / Indicators: Discomfort     Hand Dominance     Extremity/Trunk Assessment Upper Extremity Assessment Upper Extremity Assessment: Generalized weakness   Lower Extremity Assessment Lower Extremity Assessment: Defer to PT evaluation;Generalized weakness   Cervical / Trunk Assessment Cervical / Trunk Assessment: Other exceptions Cervical / Trunk Exceptions: large body habitus   Communication Communication Communication: No difficulties   Cognition Arousal/Alertness: Awake/alert Behavior During Therapy: WFL for tasks assessed/performed Overall Cognitive Status: Within Functional Limits for tasks assessed                                       General Comments  VSS on RA    Exercises     Shoulder Instructions      Home Living Family/patient expects to be discharged to:: Private residence Living Arrangements: Spouse/significant other Available Help at Discharge: Family;Available 24 hours/day Type of Home: House Home Access: Stairs to enter [employer] of Steps: 1 step-up on porch with bilateral railings & 1 step-up into the house on front, ramp at the rear entrance Entrance Stairs-Rails: Can reach both Home Layout: One level     Bathroom Shower/Tub: Teacher, early years/pre: Handicapped height     Home Equipment: Conservation officer, nature (2 wheels);Tub bench;Wheelchair - manual      Lives With: Spouse    Prior Functioning/Environment Prior Level of Function : Independent/Modified Independent;Working/employed;Driving              Mobility Comments: using RW since d/c from CIR, no falls. ADLs Comments: assist for socks/shoes. independent with use of tub bench        OT Problem List: Decreased strength;Decreased activity tolerance;Impaired balance (sitting and/or standing);Decreased coordination;Decreased cognition;Decreased safety awareness;Decreased knowledge of precautions;Decreased knowledge of use of DME or AE;Cardiopulmonary status limiting activity      OT Treatment/Interventions: Self-care/ADL training;Therapeutic exercise;Energy conservation;DME and/or AE instruction;Manual therapy;Therapeutic activities;Patient/family education;Balance training    OT Goals(Current goals can be found in the care plan section) Acute Rehab OT Goals Patient Stated Goal: to get better OT Goal Formulation: With patient Time For Goal Achievement: 10/31/22 Potential to Achieve Goals: Good ADL Goals Pt Will Perform Lower Body Bathing: Independently;sitting/lateral leans;sit to/from stand Pt Will Perform Lower Body Dressing: Independently;sit to/from stand;sitting/lateral leans Pt Will Transfer to Toilet: Independently Pt Will Perform Toileting - Clothing Manipulation and hygiene: Independently Additional ADL Goal #1: Patient will be able to verbalize 3 strategies for energy conservation and fall prevention for safe discharge home. Additional ADL Goal #2: Patient will be able to state 3 strategies for managing CHF levels to prevent further hospital admissions.  OT Frequency: Min 2X/week    Co-evaluation              AM-PAC OT "6 Clicks" Daily Activity     Outcome Measure Help from another person eating meals?: None Help from another person taking care of personal grooming?: None Help  from another person toileting, which includes using toliet, bedpan, or urinal?: A Little Help from another person bathing (including washing, rinsing, drying)?: A Little Help from another person to put on and taking off regular  upper body clothing?: A Little Help from another person to put on and taking off regular lower body clothing?: A Lot 6 Click Score: 19   End of Session Equipment Utilized During Treatment: Rolling walker (2 wheels);Gait belt Nurse Communication: Mobility status  Activity Tolerance: Patient limited by fatigue;Patient tolerated treatment well Patient left: in bed;with call bell/phone within reach;with bed alarm set (sitting EOB eating lunch)  OT Visit Diagnosis: Unsteadiness on feet (R26.81);Other abnormalities of gait and mobility (R26.89);Muscle weakness (generalized) (M62.81)                Time: 5747-3403 OT Time Calculation (min): 14 min Charges:  OT General Charges $OT Visit: 1 Visit OT Evaluation $OT Eval Moderate Complexity: 1 Mod  Corinne Ports E. Serenity Batley, OTR/L Acute Rehabilitation Services [phone]   Ascencion Dike 10/17/2022, 3:19 PM

## 2022-10-17 NOTE — Progress Notes (Addendum)
PROGRESS NOTE    ROYANNE WARSHAW  HYW:[id number] DOB: 1941-09-07 DOA: 10/16/2022 PCP: Isaac Bliss, Rayford Halsted, MD   81 /F w/ h/o of AML (remote); afib on Eliquis; chronic back pain; COPD; CAD; DM; HTN; HLD; chronic diastolic CHF; uterine/breast cancer; and CAD presenting with edema.  Recent hip fracture, dc on 10/26 from CIR. She was swollen and had trouble lifting her feet because they were too heavy.  Her legs feel like they weigh a ton and she is mostly in the wheelchair.  Her arms are also swollen and "will leak water out of them."  Some SOB  ER Course: BNP 1198, Creat 3.2, hb 9.2  Subjective: Sore in both legs, both thighs tight and swollen  Assessment and Plan:  Acute on chronic diastolic CHF -Patient with known h/o chronic diastolic CHF presenting with worsening edema, developing anasarca, profound upper thigh swelling -Echo on 5/23 with preserved EF; repeat echo ordered on admission we will follow-up -hypoalbuminemia contributing -Previously started on Entresto but this was stopped due to "itching" and changed to valsartan, Hold Jardiance -Increase dose to 80 Mg twice daily, add albumin x2 doses today -GDMT limited by AKI/CKD -Cards consulting   AKI on Stage 4 CKD -Likely associated with diminished renal perfusion in the setting of volume overload, baseline creat 1.8-2, now 3.2 on admission -Renal ultrasound without hydronephrosis, avoid hypotension -Hold Jardiance and valsartan   Afib -Continue Toprol XL for rate control -Continue Eliquis   H/o AML and recent breast cancer -Followed by Dr. [name], last seen 7/24 -She is in clinical remission and osseous changes on prior imaging are thought to be residual from AML treatment  -She has a port-a-cath because she wanted to keep it, currently accessed -Continue letrozole for now   Seizure d/o -Continue Keppra   COPD -Uses Breo prn; will continue   HTN -Continue metoprolol, hold Imdur -Hold valsatran, Jardiance    HLD -Continue Lipitor   DM -Last A1c was 9.9, indicating poor control -Hold glargine for now -SSI   Chronic pain -Continue Ativan, Robaxin, Oxy per home regimen   CAD -s/p stent,  Too high risk for CABG in 2018 -Continue Imdur, Plavix    DVT prophylaxis: apixaban Code Status: DNR Family Communication: None present Disposition Plan: May need rehab again, will request PT OT eval  Consultants:    Procedures:   Antimicrobials:    Objective: Vitals:   10/16/22 2100 10/16/22 2104 10/17/22 0052 10/17/22 0400  BP: (!) 141/56 134/89 120/73 (!) 137/51  Pulse: (!) 116 61 (!) 56 68  Resp: '20 17 16   '$ Temp:  97.7 F (36.5 C) (!) 97.5 F (36.4 C) 98.7 F (37.1 C)  TempSrc:  Oral Oral Oral  SpO2: 98% 100% 100%   Weight:  78.1 kg  77.8 kg  Height:  '5\' 2"'$  (1.575 m)      Intake/Output Summary (Last 24 hours) at 10/17/2022 0610 Last data filed at 10/17/2022 0500 Gross per 24 hour  Intake 483 ml  Output --  Net 483 ml   Filed Weights   10/16/22 2104 10/17/22 0400  Weight: 78.1 kg 77.8 kg    Examination:  General exam: Appears calm and comfortable  Respiratory system: Clear to auscultation Cardiovascular system: S1 & S2 heard, RRR.  Abd: nondistended, soft and nontender.Normal bowel sounds heard. Central nervous system: Alert and oriented. No focal neurological deficits. Extremities: no edema Skin: No rashes Psychiatry:  Mood & affect appropriate.     Data Reviewed:   CBC:  Recent Labs  Lab 10/16/22 0342 10/17/22 0315  WBC 4.7 4.3  NEUTROABS 3.1  --   HGB 9.2* 9.0*  HCT 28.9* 28.1*  MCV 98.3 96.6  PLT 136* 751*   Basic Metabolic Panel: Recent Labs  Lab 10/16/22 0342 10/17/22 0315  NA 140 143  K 4.4 4.4  CL 111 116*  CO2 19* 19*  GLUCOSE 90 122*  BUN 78* 79*  CREATININE 3.26* 3.01*  CALCIUM 6.9* 6.6*   GFR: Estimated Creatinine Clearance: 14.4 mL/min (A) (by C-G formula based on SCr of 3.01 mg/dL (H)). Liver Function Tests: Recent Labs   Lab 10/16/22 0510  AST 19  ALT 9  ALKPHOS 116  BILITOT 0.6  PROT 5.6*  ALBUMIN 2.8*   No results for input(s): "LIPASE", "AMYLASE" in the last 168 hours. No results for input(s): "AMMONIA" in the last 168 hours. Coagulation Profile: No results for input(s): "INR", "PROTIME" in the last 168 hours. Cardiac Enzymes: No results for input(s): "CKTOTAL", "CKMB", "CKMBINDEX", "TROPONINI" in the last 168 hours. BNP (last 3 results) No results for input(s): "PROBNP" in the last 8760 hours. HbA1C: No results for input(s): "HGBA1C" in the last 72 hours. CBG: Recent Labs  Lab 10/16/22 1205 10/16/22 2201  GLUCAP 60* 135*   Lipid Profile: No results for input(s): "CHOL", "HDL", "LDLCALC", "TRIG", "CHOLHDL", "LDLDIRECT" in the last 72 hours. Thyroid Function Tests: No results for input(s): "TSH", "T4TOTAL", "FREET4", "T3FREE", "THYROIDAB" in the last 72 hours. Anemia Panel: No results for input(s): "VITAMINB12", "FOLATE", "FERRITIN", "TIBC", "IRON", "RETICCTPCT" in the last 72 hours. Urine analysis:    Component Value Date/Time   COLORURINE YELLOW 09/18/2022 1302   APPEARANCEUR TURBID (A) 09/18/2022 1302   LABSPEC 1.008 09/18/2022 1302   LABSPEC 1.015 08/04/2014 1439   PHURINE 5.0 09/18/2022 1302   GLUCOSEU >=500 (A) 09/18/2022 1302   GLUCOSEU Negative 08/04/2014 1439   HGBUR MODERATE (A) 09/18/2022 1302   HGBUR negative 10/10/2010 1607   BILIRUBINUR NEGATIVE 09/18/2022 1302   BILIRUBINUR Negative 08/04/2014 1439   KETONESUR NEGATIVE 09/18/2022 1302   PROTEINUR NEGATIVE 09/18/2022 1302   UROBILINOGEN 0.2 08/04/2014 1439   NITRITE POSITIVE (A) 09/18/2022 1302   LEUKOCYTESUR LARGE (A) 09/18/2022 1302   LEUKOCYTESUR Negative 08/04/2014 1439   Sepsis Labs: '@LABRCNTIP'$ (procalcitonin:4,lacticidven:4)  )No results found for this or any previous visit (from the past 240 hour(s)).   Radiology Studies: US RENAL  Result Date: 10/16/2022 CLINICAL DATA:  AK I EXAM: RENAL / URINARY  TRACT ULTRASOUND COMPLETE COMPARISON:  Renal ultrasound dated 08/31/2022 FINDINGS: Right Kidney: Renal measurements: 10.3 x 5.2 x 4.0 cm = volume: 109.8 mL (previously 160 ml). Echogenicity is slightly increased. No hydronephrosis visualized. Within the right kidney there is a 1.4 cm anechoic lesion, compatible with a simple renal cyst, and unchanged from prior exam. Left Kidney: Renal measurements: 9.3 x 4.6 x 4.3 cm = volume: 95.5 mL (previously 145 ml). Echogenicity is slightly increased. No hydronephrosis visualized. Within left kidney, there are newly visualized anechoic lesions along the upper pole measuring up to 1.1 cm and 0.7 cm, which is also likely represent renal cysts and likely poorly visualized on prior exam due to poor visualization of the left kidney. Bladder: Appears normal for degree of bladder distention. Other: Incidentally noted are bilateral pleural effusions and possible small amount of perihepatic ascites. IMPRESSION: 1. No hydronephrosis or nephrolithiasis. 2. Findings suggestive of medical renal disease. 3. Incidentally noted are small bilateral pleural effusions and possible perihepatic ascites, new from prior. Electronically Signed  By: Marin Roberts M.D.   On: 10/16/2022 17:41   ECHOCARDIOGRAM COMPLETE  Result Date: 10/16/2022    ECHOCARDIOGRAM REPORT   Patient Name:   LUISA LOUK Date of Exam: 10/16/2022 Medical Rec #:  [id number]     Height:       62.0 in Accession #:    [id number]    Weight:       172.2 lb Date of Birth:  [birth year]     BSA:          1.794 m Patient Age:    82 years      BP:           131/66 mmHg Patient Gender: F             HR:           50 bpm. Exam Location:  Inpatient Procedure: 2D Echo, 3D Echo, Cardiac Doppler and Color Doppler Indications:    I50.40* Unspecified combined systolic (congestive) and diastolic                 (congestive) heart failure  History:        Patient has prior history of Echocardiogram examinations, most                 recent  04/17/2022. CHF, CAD, Abnormal ECG, TIA, Aortic Valve                 Disease, Arrythmias:Atrial Fibrillation and Atrial Flutter,                 Signs/Symptoms:Chest Pain; Risk Factors:Hypertension,                 Dyslipidemia and Diabetes. Aortic stenosis. Breast cancer.                 Seizure.  Sonographer:    Roseanna Rainbow RDCS Referring Phys: [id number] JENNIFER YATES  Sonographer Comments: Technically difficult study due to poor echo windows. Image acquisition challenging due to patient body habitus. IMPRESSIONS  1. Left ventricular ejection fraction, by estimation, is 60 to 65%. The left ventricle has normal function. The left ventricle has no regional wall motion abnormalities. There is mild left ventricular hypertrophy. Left ventricular diastolic parameters are indeterminate.  2. Right ventricular systolic function is normal. The right ventricular size is normal. There is mildly elevated pulmonary artery systolic pressure.  3. Left atrial size was moderately dilated.  4. The mitral valve is degenerative. Mild mitral valve regurgitation. No evidence of mitral stenosis. Severe mitral annular calcification.  5. Tricuspid valve regurgitation is moderate to severe.  6. The aortic valve is normal in structure. There is severe calcifcation of the aortic valve. There is severe thickening of the aortic valve. Aortic valve regurgitation is not visualized. Mild to moderate aortic valve stenosis.  7. The inferior vena cava is normal in size with greater than 50% respiratory variability, suggesting right atrial pressure of 3 mmHg. FINDINGS  Left Ventricle: Left ventricular ejection fraction, by estimation, is 60 to 65%. The left ventricle has normal function. The left ventricle has no regional wall motion abnormalities. The left ventricular internal cavity size was normal in size. There is  mild left ventricular hypertrophy. Left ventricular diastolic parameters are indeterminate. Right Ventricle: The right ventricular size is  normal. No increase in right ventricular wall thickness. Right ventricular systolic function is normal. There is mildly elevated pulmonary artery systolic pressure. The tricuspid regurgitant velocity is 2.87  m/s, and with an assumed  right atrial pressure of 8 mmHg, the estimated right ventricular systolic pressure is 29.5 mmHg. Left Atrium: Left atrial size was moderately dilated. Right Atrium: Right atrial size was normal in size. Pericardium: Trivial pericardial effusion is present. The pericardial effusion is posterior to the left ventricle. Mitral Valve: The mitral valve is degenerative in appearance. There is severe thickening of the mitral valve leaflet(s). There is severe calcification of the mitral valve leaflet(s). Severe mitral annular calcification. Mild mitral valve regurgitation. No evidence of mitral valve stenosis. Tricuspid Valve: The tricuspid valve is normal in structure. Tricuspid valve regurgitation is moderate to severe. No evidence of tricuspid stenosis. Aortic Valve: The aortic valve is normal in structure. There is severe calcifcation of the aortic valve. There is severe thickening of the aortic valve. Aortic valve regurgitation is not visualized. Mild to moderate aortic stenosis is present. Aortic valve mean gradient measures 12.0 mmHg. Aortic valve peak gradient measures 21.3 mmHg. Aortic valve area, by VTI measures 1.01 cm. Pulmonic Valve: The pulmonic valve was normal in structure. Pulmonic valve regurgitation is trivial. No evidence of pulmonic stenosis. Aorta: The aortic root is normal in size and structure. Venous: The inferior vena cava is normal in size with greater than 50% respiratory variability, suggesting right atrial pressure of 3 mmHg. IAS/Shunts: The interatrial septum appears to be lipomatous. No atrial level shunt detected by color flow Doppler.  LEFT VENTRICLE PLAX 2D LVIDd:         4.00 cm LVIDs:         2.90 cm LV PW:         1.30 cm LV IVS:        1.30 cm LVOT diam:      1.70 cm     3D Volume EF: LV SV:         57          3D EF:        51 % LV SV Index:   32          LV EDV:       125 ml LVOT Area:     2.27 cm    LV ESV:       62 ml                            LV SV:        64 ml  LV Volumes (MOD) LV vol d, MOD A2C: 83.5 ml LV vol d, MOD A4C: 54.1 ml LV vol s, MOD A2C: 16.6 ml LV vol s, MOD A4C: 16.4 ml LV SV MOD A2C:     66.9 ml LV SV MOD A4C:     54.1 ml LV SV MOD BP:      49.8 ml RIGHT VENTRICLE            IVC RV S prime:     8.05 cm/s  IVC diam: 2.20 cm TAPSE (M-mode): 1.9 cm LEFT ATRIUM             Index        RIGHT ATRIUM           Index LA diam:        4.30 cm 2.40 cm/m   RA Area:     13.60 cm LA Vol (A2C):   60.2 ml 33.56 ml/m  RA Volume:   29.70 ml  16.56 ml/m LA Vol (A4C):   64.7 ml 36.07 ml/m LA  Biplane Vol: 62.4 ml 34.79 ml/m  AORTIC VALVE AV Area (Vmax):    0.90 cm AV Area (Vmean):   0.88 cm AV Area (VTI):     1.01 cm AV Vmax:           230.80 cm/s AV Vmean:          159.800 cm/s AV VTI:            0.567 m AV Peak Grad:      21.3 mmHg AV Mean Grad:      12.0 mmHg LVOT Vmax:         91.80 cm/s LVOT Vmean:        62.300 cm/s LVOT VTI:          0.252 m LVOT/AV VTI ratio: 0.44  AORTA Ao Root diam: 3.00 cm Ao Asc diam:  3.00 cm MITRAL VALVE                TRICUSPID VALVE MV Area (PHT): 4.24 cm     TR Peak grad:   32.9 mmHg MV Decel Time: 179 msec     TR Vmax:        287.00 cm/s MV E velocity: 118.00 cm/s                             SHUNTS                             Systemic VTI:  0.25 m                             Systemic Diam: 1.70 cm Jenkins Rouge MD Electronically signed by Jenkins Rouge MD Signature Date/Time: 10/16/2022/3:54:03 PM    Final    DG Chest 2 View  Result Date: 10/16/2022 CLINICAL DATA:  Shortness of breath EXAM: CHEST - 2 VIEW COMPARISON:  08/29/2022 FINDINGS: Redemonstrated right chest port with catheter tip overlying the superior cavoatrial junction. Cardiomegaly. Increased right pleural effusion, now moderate, with associated atelectasis.  Trace left pleural effusion. No pneumothorax. No acute osseous abnormality. IMPRESSION: 1. Increased right pleural effusion, now moderate, with associated atelectasis. 2. Trace left pleural effusion. Electronically Signed   By: Merilyn Baba M.D.   On: 10/16/2022 03:15     Scheduled Meds:  apixaban  2.5 mg Oral BID   atorvastatin  40 mg Oral Daily   Chlorhexidine Gluconate Cloth  6 each Topical Daily   clopidogrel  75 mg Oral QPM   docusate sodium  100 mg Oral BID   furosemide  40 mg Intravenous BID   insulin aspart  0-15 Units Subcutaneous TID WC   insulin aspart  0-5 Units Subcutaneous QHS   isosorbide mononitrate  30 mg Oral Daily   letrozole  2.5 mg Oral Daily   levETIRAcetam  500 mg Oral BID   melatonin  3 mg Oral QHS   metoprolol succinate  12.5 mg Oral Daily   polyethylene glycol  17 g Oral Daily   psyllium  1 packet Oral Daily   sodium bicarbonate  650 mg Oral BID   sodium chloride flush  3 mL Intravenous Q12H   Continuous Infusions:   LOS: 1 day    Time spent: 20mn    PDomenic Polite MD [facility]   10/17/2022, 6:10 AM

## 2022-10-17 NOTE — Evaluation (Signed)
Physical Therapy Evaluation Patient Details Name: [name] MRN: [id number] DOB: [birth year] Today's Date: 10/17/2022  History of Present Illness  The pt is an 81 yo female presenting 11/9 with SOB and LE edema, found to have CHF exacerbation. Pt with recent admission for hip fx, d/c from inpatient rehab 10/26. PMH includes: arthritis, cardiomyopathy, CKD IV, CHF, COPD, CAD, DM II, HLD, HTN, and NSTEMI.   Clinical Impression  Pt in bed upon arrival of PT, agreeable to evaluation at this time. Prior to admission the pt was mobilizing in the home with use of RW after d/c from acute inpatient rehab, reports limited to household distances with RW due to poor endurance. The pt now presents with limitations in functional mobility, strength, power, and endurance due to above dx, and will continue to benefit from skilled PT to address these deficits. The pt required increased time to rise from various seated surfaces, but was able to complete short bout of ambulation in room and hallway with minG and use of RW. She had no overt LOB but is dependent on BUE support at this time and continues to demo slightly antalgic gait following R hip fx. The pt will continue to benefit from skilled PT acutely to progress functional strength, power, and endurance, but will be safe to return home with support from her spouse and HHPT when medically cleared for d/c.       Recommendations for follow up therapy are one component of a multi-disciplinary discharge planning process, led by the attending physician.  Recommendations may be updated based on patient status, additional functional criteria and insurance authorization.  Follow Up Recommendations Home health PT      Assistance Recommended at Discharge Frequent or constant Supervision/Assistance  Patient can return home with the following  A little help with walking and/or transfers;A little help with bathing/dressing/bathroom;Assistance with cooking/housework;Help  with stairs or ramp for entrance    Equipment Recommendations None recommended by PT  Recommendations for Other Services       Functional Status Assessment Patient has had a recent decline in their functional status and demonstrates the ability to make significant improvements in function in a reasonable and predictable amount of time.     Precautions / Restrictions Precautions Precautions: Fall Precaution Comments: monitor SpO2 to maintain >92%, hx of seizure disorder, no R LE ROM restrictions Restrictions Weight Bearing Restrictions: No RLE Weight Bearing: Weight bearing as tolerated      Mobility  Bed Mobility Overal bed mobility: Needs Assistance Bed Mobility: Sit to Supine       Sit to supine: Min assist   General bed mobility comments: minA to bring LE into bed    Transfers Overall transfer level: Needs assistance Equipment used: Rolling walker (2 wheels) Transfers: Sit to/from Stand Sit to Stand: Min guard, Min assist           General transfer comment: minG from recliner, minA from low toilet    Ambulation/Gait Ambulation/Gait assistance: Min guard Gait Distance (Feet): 45 Feet Assistive device: Rolling walker (2 wheels) Gait Pattern/deviations: Step-to pattern, Step-through pattern, Decreased stance time - right, Decreased stride length, Knee flexed in stance - right Gait velocity: decreased Gait velocity interpretation: <1.31 ft/sec, indicative of household ambulator   General Gait Details: RLE in external rotation, less DF. pt reports this is baseline after hip fx     Balance Overall balance assessment: Needs assistance Sitting-balance support: No upper extremity supported, Feet supported Sitting balance-Leahy Scale: [place]     Standing  balance support: Bilateral upper extremity supported, During functional activity Standing balance-Leahy Scale: Fair Standing balance comment: can static stand without UE support, BUE support for gait                              Pertinent Vitals/Pain Pain Assessment Pain Assessment: Faces Faces Pain Scale: Hurts a little bit Pain Location: L heel, R hip Pain Descriptors / Indicators: Discomfort Pain Intervention(s): Limited activity within patient's tolerance, Monitored during session, Repositioned    Home Living Family/patient expects to be discharged to:: Private residence Living Arrangements: Spouse/significant other Available Help at Discharge: Family;Available 24 hours/day Type of Home: House Home Access: Stairs to enter Entrance Stairs-Rails: Can reach both Entrance Stairs-Number of Steps: 1 step-up on porch with bilateral railings & 1 step-up into the house on front, ramp at the rear entrance   Home Layout: One level Home Equipment: Conservation officer, nature (2 wheels);Tub bench;Wheelchair - manual      Prior Function Prior Level of Function : Independent/Modified Independent;Working/employed;Driving             Mobility Comments: using RW since d/c from CIR, no falls. ADLs Comments: assist for socks/shoes. independent with use of tub bench     Hand Dominance        Extremity/Trunk Assessment   Upper Extremity Assessment Upper Extremity Assessment: Generalized weakness    Lower Extremity Assessment Lower Extremity Assessment: Generalized weakness (bilateral edema, RLE with external rotation which pt reports is chronic after hip fx)    Cervical / Trunk Assessment Cervical / Trunk Assessment: Other exceptions Cervical / Trunk Exceptions: large body habitus  Communication   Communication: No difficulties  Cognition Arousal/Alertness: Awake/alert Behavior During Therapy: WFL for tasks assessed/performed Overall Cognitive Status: Within Functional Limits for tasks assessed                                          General Comments General comments (skin integrity, edema, etc.): VSS on RA    Exercises     Assessment/Plan    PT Assessment  Patient needs continued PT services  PT Problem List Decreased strength;Decreased activity tolerance;Decreased range of motion;Decreased balance;Decreased mobility;Cardiopulmonary status limiting activity       PT Treatment Interventions DME instruction;Gait training;Stair training;Functional mobility training;Therapeutic activities;Therapeutic exercise;Balance training;Patient/family education    PT Goals (Current goals can be found in the Care Plan section)  Acute Rehab PT Goals Patient Stated Goal: return home PT Goal Formulation: With patient Time For Goal Achievement: 10/31/22 Potential to Achieve Goals: Good    Frequency Min 3X/week        AM-PAC PT "6 Clicks" Mobility  Outcome Measure Help needed turning from your back to your side while in a flat bed without using bedrails?: A Little Help needed moving from lying on your back to sitting on the side of a flat bed without using bedrails?: A Little Help needed moving to and from a bed to a chair (including a wheelchair)?: A Little Help needed standing up from a chair using your arms (e.g., wheelchair or bedside chair)?: A Little Help needed to walk in hospital room?: A Little Help needed climbing 3-5 steps with a railing? : A Lot 6 Click Score: 17    End of Session Equipment Utilized During Treatment: Gait belt Activity Tolerance: Patient tolerated treatment well Patient left: in bed;with  call bell/phone within reach;with bed alarm set Nurse Communication: Mobility status PT Visit Diagnosis: Unsteadiness on feet (R26.81);Muscle weakness (generalized) (M62.81)    Time: 1594-7076 PT Time Calculation (min) (ACUTE ONLY): 26 min   Charges:   PT Evaluation $PT Eval Low Complexity: 1 Low PT Treatments $Therapeutic Exercise: 8-22 mins        [place] Carbo, PT, DPT   Acute Rehabilitation Department  Sandra Cockayne 10/17/2022, 8:45 AM

## 2022-10-18 ENCOUNTER — Inpatient Hospital Stay (HOSPITAL_COMMUNITY): Payer: Medicare HMO

## 2022-10-18 DIAGNOSIS — I5033 Acute on chronic diastolic (congestive) heart failure: Secondary | ICD-10-CM | POA: Diagnosis not present

## 2022-10-18 LAB — BASIC METABOLIC PANEL
Anion gap: 9 (ref 5–15)
BUN: 79 mg/dL — ABNORMAL HIGH (ref 8–23)
CO2: 21 mmol/L — ABNORMAL LOW (ref 22–32)
Calcium: 6.7 mg/dL — ABNORMAL LOW (ref 8.9–10.3)
Chloride: 115 mmol/L — ABNORMAL HIGH (ref 98–111)
Creatinine, Ser: 2.79 mg/dL — ABNORMAL HIGH (ref 0.44–1.00)
GFR, Estimated: 17 mL/min — ABNORMAL LOW (ref >60)
Glucose, Bld: 113 mg/dL — ABNORMAL HIGH (ref 70–99)
Potassium: 4.5 mmol/L (ref 3.5–5.1)
Sodium: 145 mmol/L (ref 135–145)

## 2022-10-18 LAB — GLUCOSE, CAPILLARY
Glucose-Capillary: 103 mg/dL — ABNORMAL HIGH (ref 70–99)
Glucose-Capillary: 185 mg/dL — ABNORMAL HIGH (ref 70–99)
Glucose-Capillary: 141 mg/dL — ABNORMAL HIGH (ref 70–99)
Glucose-Capillary: 116 mg/dL — ABNORMAL HIGH (ref 70–99)

## 2022-10-18 MED ORDER — IPRATROPIUM-ALBUTEROL 0.5-2.5 (3) MG/3ML IN SOLN
3.0000 mL | RESPIRATORY_TRACT | Status: DC | PRN
  Administered 2022-10-18 – 2022-10-20 (×3): 3 mL via RESPIRATORY_TRACT
  Filled 2022-10-18 (×3): qty 3

## 2022-10-18 MED ORDER — FUROSEMIDE 10 MG/ML IJ SOLN
80.0000 mg | Freq: Two times a day (BID) | INTRAMUSCULAR | Status: DC
  Administered 2022-10-18 – 2022-10-25 (×14): 80 mg via INTRAVENOUS
  Filled 2022-10-18 (×14): qty 8

## 2022-10-18 MED ORDER — FUROSEMIDE 10 MG/ML IJ SOLN: 120.0000 mg | Freq: Two times a day (BID) | INTRAVENOUS | Status: DC

## 2022-10-18 MED ORDER — FUROSEMIDE 10 MG/ML IJ SOLN: 40.0000 mg | Freq: Once | INTRAMUSCULAR | Status: DC

## 2022-10-18 NOTE — Progress Notes (Addendum)
PROGRESS NOTE    [name]  ZOX:[id number] DOB: 08-14-1941 DOA: 10/16/2022 PCP: Isaac Bliss, Rayford Halsted, MD   81 /F w/ h/o of AML (remote); afib on Eliquis; chronic back pain; COPD; CAD; DM; HTN; HLD; chronic diastolic CHF; uterine/breast cancer; and CAD presenting with edema.  Recent hip fracture, dc on 10/26 from CIR. She was swollen and had trouble lifting her feet because they were too heavy.  Her legs feel like they weigh a ton and she is mostly in the wheelchair.  Her arms are also swollen and "will leak water out of them."  Some SOB  ER Course: BNP 1198, Creat 3.2, hb 9.2  Subjective: -starting to improve, urinated a lot, but not recorded  Assessment and Plan:  Acute on chronic diastolic CHF -Patient with known h/o chronic diastolic CHF presenting with worsening edema, developing anasarca, profound upper thigh swelling -Echo on 5/23 with preserved EF; repeat echo unchanged -hypoalbuminemia contributing -Previously started on Entresto but this was stopped due to "itching" and changed to valsartan, Hold Jardiance -continue current dose of IV lasix '80mg'$  BID, Urine output inaccurate, weight down -GDMT limited by AKI/CKD -FU CXR to determine need for Thoracentesis -Cards consulting, BMP in am -OOB, PT eval   AKI on Stage 4 CKD -Likely associated with diminished renal perfusion in the setting of volume overload, baseline creat 1.8-2, now 3.2 on admission -Renal ultrasound without hydronephrosis, avoid hypotension -Hold Jardiance and valsartan   Afib -Continue Toprol XL for rate control -Continue Eliquis   H/o AML and recent breast cancer -Followed by Dr. [name], last seen 7/24 -She is in clinical remission and osseous changes on prior imaging are thought to be residual from AML treatment  -She has a port-a-cath because she wanted to keep it, currently accessed -Continue letrozole for now   Seizure d/o -Continue Keppra   COPD -Uses Breo prn; will continue    HTN -Continue metoprolol, hold Imdur -Hold valsatran, Jardiance   HLD -Continue Lipitor   DM -Last A1c was 9.9, indicating poor control -Hold glargine for now -SSI   Chronic pain -Continue Ativan, Robaxin, Oxy per home regimen   CAD -s/p stent,  Too high risk for CABG in 2018 -Continue Imdur, Plavix    DVT prophylaxis: apixaban Code Status: DNR Family Communication: None present Disposition Plan: May need rehab again, will request PT OT eval  Consultants: Cards   Procedures:   Antimicrobials:    Objective: Vitals:   10/17/22 1658 10/17/22 1935 10/18/22 0628 10/18/22 0734  BP: 117/64 (!) 115/48 (!) 142/52 (!) 116/93  Pulse: 65 75 75 83  Resp: 20 (!) 23 (!) 22 (!) 23  Temp: 98.7 F (37.1 C) 98.7 F (37.1 C) 98.7 F (37.1 C) 97.7 F (36.5 C)  TempSrc: Oral Oral Oral Oral  SpO2: 95% 96% 96% 92%  Weight:   76.6 kg   Height:        Intake/Output Summary (Last 24 hours) at 10/18/2022 0908 Last data filed at 10/18/2022 0839 Gross per 24 hour  Intake 580 ml  Output 1075 ml  Net -495 ml   Filed Weights   10/16/22 2104 10/17/22 0400 10/18/22 0628  Weight: 78.1 kg 77.8 kg 76.6 kg    Examination:  General exam: Appears calm and comfortable  Respiratory system: diminished BS Cardiovascular system: S1 & S2 heard, RRR.  Abd: nondistended, soft and nontender.Normal bowel sounds heard. Central nervous system: Alert and oriented. No focal neurological deficits. Extremities: 2-3+ edema, upper thigh, flank Skin: No  rashes Psychiatry:  Mood & affect appropriate.     Data Reviewed:   CBC: Recent Labs  Lab 10/16/22 0342 10/17/22 0315  WBC 4.7 4.3  NEUTROABS 3.1  --   HGB 9.2* 9.0*  HCT 28.9* 28.1*  MCV 98.3 96.6  PLT 136* 627*   Basic Metabolic Panel: Recent Labs  Lab 10/16/22 0342 10/17/22 0315 10/18/22 0544  NA 140 143 145  K 4.4 4.4 4.5  CL 111 116* 115*  CO2 19* 19* 21*  GLUCOSE 90 122* 113*  BUN 78* 79* 79*  CREATININE 3.26* 3.01*  2.79*  CALCIUM 6.9* 6.6* 6.7*   GFR: Estimated Creatinine Clearance: 15.4 mL/min (A) (by C-G formula based on SCr of 2.79 mg/dL (H)). Liver Function Tests: Recent Labs  Lab 10/16/22 0510  AST 19  ALT 9  ALKPHOS 116  BILITOT 0.6  PROT 5.6*  ALBUMIN 2.8*   No results for input(s): "LIPASE", "AMYLASE" in the last 168 hours. No results for input(s): "AMMONIA" in the last 168 hours. Coagulation Profile: No results for input(s): "INR", "PROTIME" in the last 168 hours. Cardiac Enzymes: No results for input(s): "CKTOTAL", "CKMB", "CKMBINDEX", "TROPONINI" in the last 168 hours. BNP (last 3 results) No results for input(s): "PROBNP" in the last 8760 hours. HbA1C: No results for input(s): "HGBA1C" in the last 72 hours. CBG: Recent Labs  Lab 10/17/22 0718 10/17/22 1109 10/17/22 1701 10/17/22 2118 10/18/22 0626  GLUCAP 100* 191* 187* 201* 103*   Lipid Profile: No results for input(s): "CHOL", "HDL", "LDLCALC", "TRIG", "CHOLHDL", "LDLDIRECT" in the last 72 hours. Thyroid Function Tests: No results for input(s): "TSH", "T4TOTAL", "FREET4", "T3FREE", "THYROIDAB" in the last 72 hours. Anemia Panel: No results for input(s): "VITAMINB12", "FOLATE", "FERRITIN", "TIBC", "IRON", "RETICCTPCT" in the last 72 hours. Urine analysis:    Component Value Date/Time   COLORURINE YELLOW 09/18/2022 1302   APPEARANCEUR TURBID (A) 09/18/2022 1302   LABSPEC 1.008 09/18/2022 1302   LABSPEC 1.015 08/04/2014 1439   PHURINE 5.0 09/18/2022 1302   GLUCOSEU >=500 (A) 09/18/2022 1302   GLUCOSEU Negative 08/04/2014 1439   HGBUR MODERATE (A) 09/18/2022 1302   HGBUR negative 10/10/2010 1607   BILIRUBINUR NEGATIVE 09/18/2022 1302   BILIRUBINUR Negative 08/04/2014 1439   KETONESUR NEGATIVE 09/18/2022 1302   PROTEINUR NEGATIVE 09/18/2022 1302   UROBILINOGEN 0.2 08/04/2014 1439   NITRITE POSITIVE (A) 09/18/2022 1302   LEUKOCYTESUR LARGE (A) 09/18/2022 1302   LEUKOCYTESUR Negative 08/04/2014 1439   Sepsis  Labs: '@LABRCNTIP'$ (procalcitonin:4,lacticidven:4)  )No results found for this or any previous visit (from the past 240 hour(s)).   Radiology Studies: US RENAL  Result Date: 10/16/2022 CLINICAL DATA:  AK I EXAM: RENAL / URINARY TRACT ULTRASOUND COMPLETE COMPARISON:  Renal ultrasound dated 08/31/2022 FINDINGS: Right Kidney: Renal measurements: 10.3 x 5.2 x 4.0 cm = volume: 109.8 mL (previously 160 ml). Echogenicity is slightly increased. No hydronephrosis visualized. Within the right kidney there is a 1.4 cm anechoic lesion, compatible with a simple renal cyst, and unchanged from prior exam. Left Kidney: Renal measurements: 9.3 x 4.6 x 4.3 cm = volume: 95.5 mL (previously 145 ml). Echogenicity is slightly increased. No hydronephrosis visualized. Within left kidney, there are newly visualized anechoic lesions along the upper pole measuring up to 1.1 cm and 0.7 cm, which is also likely represent renal cysts and likely poorly visualized on prior exam due to poor visualization of the left kidney. Bladder: Appears normal for degree of bladder distention. Other: Incidentally noted are bilateral pleural effusions and possible small amount  of perihepatic ascites. IMPRESSION: 1. No hydronephrosis or nephrolithiasis. 2. Findings suggestive of medical renal disease. 3. Incidentally noted are small bilateral pleural effusions and possible perihepatic ascites, new from prior. Electronically Signed   By: Marin Roberts M.D.   On: 10/16/2022 17:41   ECHOCARDIOGRAM COMPLETE  Result Date: 10/16/2022    ECHOCARDIOGRAM REPORT   Patient Name:   [name] Date of Exam: 10/16/2022 Medical Rec #:  [id number]     Height:       62.0 in Accession #:    [id number]    Weight:       172.2 lb Date of Birth:  1941/11/19     BSA:          1.794 m Patient Age:    56 years      BP:           131/66 mmHg Patient Gender: F             HR:           50 bpm. Exam Location:  Inpatient Procedure: 2D Echo, 3D Echo, Cardiac Doppler and Color  Doppler Indications:    I50.40* Unspecified combined systolic (congestive) and diastolic                 (congestive) heart failure  History:        Patient has prior history of Echocardiogram examinations, most                 recent 04/17/2022. CHF, CAD, Abnormal ECG, TIA, Aortic Valve                 Disease, Arrythmias:Atrial Fibrillation and Atrial Flutter,                 Signs/Symptoms:Chest Pain; Risk Factors:Hypertension,                 Dyslipidemia and Diabetes. Aortic stenosis. Breast cancer.                 Seizure.  Sonographer:    Roseanna Rainbow RDCS Referring Phys: [id number] JENNIFER YATES  Sonographer Comments: Technically difficult study due to poor echo windows. Image acquisition challenging due to patient body habitus. IMPRESSIONS  1. Left ventricular ejection fraction, by estimation, is 60 to 65%. The left ventricle has normal function. The left ventricle has no regional wall motion abnormalities. There is mild left ventricular hypertrophy. Left ventricular diastolic parameters are indeterminate.  2. Right ventricular systolic function is normal. The right ventricular size is normal. There is mildly elevated pulmonary artery systolic pressure.  3. Left atrial size was moderately dilated.  4. The mitral valve is degenerative. Mild mitral valve regurgitation. No evidence of mitral stenosis. Severe mitral annular calcification.  5. Tricuspid valve regurgitation is moderate to severe.  6. The aortic valve is normal in structure. There is severe calcifcation of the aortic valve. There is severe thickening of the aortic valve. Aortic valve regurgitation is not visualized. Mild to moderate aortic valve stenosis.  7. The inferior vena cava is normal in size with greater than 50% respiratory variability, suggesting right atrial pressure of 3 mmHg. FINDINGS  Left Ventricle: Left ventricular ejection fraction, by estimation, is 60 to 65%. The left ventricle has normal function. The left ventricle has no regional wall  motion abnormalities. The left ventricular internal cavity size was normal in size. There is  mild left ventricular hypertrophy. Left ventricular diastolic parameters are indeterminate. Right Ventricle: The right ventricular size  is normal. No increase in right ventricular wall thickness. Right ventricular systolic function is normal. There is mildly elevated pulmonary artery systolic pressure. The tricuspid regurgitant velocity is 2.87  m/s, and with an assumed right atrial pressure of 8 mmHg, the estimated right ventricular systolic pressure is 94.8 mmHg. Left Atrium: Left atrial size was moderately dilated. Right Atrium: Right atrial size was normal in size. Pericardium: Trivial pericardial effusion is present. The pericardial effusion is posterior to the left ventricle. Mitral Valve: The mitral valve is degenerative in appearance. There is severe thickening of the mitral valve leaflet(s). There is severe calcification of the mitral valve leaflet(s). Severe mitral annular calcification. Mild mitral valve regurgitation. No evidence of mitral valve stenosis. Tricuspid Valve: The tricuspid valve is normal in structure. Tricuspid valve regurgitation is moderate to severe. No evidence of tricuspid stenosis. Aortic Valve: The aortic valve is normal in structure. There is severe calcifcation of the aortic valve. There is severe thickening of the aortic valve. Aortic valve regurgitation is not visualized. Mild to moderate aortic stenosis is present. Aortic valve mean gradient measures 12.0 mmHg. Aortic valve peak gradient measures 21.3 mmHg. Aortic valve area, by VTI measures 1.01 cm. Pulmonic Valve: The pulmonic valve was normal in structure. Pulmonic valve regurgitation is trivial. No evidence of pulmonic stenosis. Aorta: The aortic root is normal in size and structure. Venous: The inferior vena cava is normal in size with greater than 50% respiratory variability, suggesting right atrial pressure of 3 mmHg.  IAS/Shunts: The interatrial septum appears to be lipomatous. No atrial level shunt detected by color flow Doppler.  LEFT VENTRICLE PLAX 2D LVIDd:         4.00 cm LVIDs:         2.90 cm LV PW:         1.30 cm LV IVS:        1.30 cm LVOT diam:     1.70 cm     3D Volume EF: LV SV:         57          3D EF:        51 % LV SV Index:   32          LV EDV:       125 ml LVOT Area:     2.27 cm    LV ESV:       62 ml                            LV SV:        64 ml  LV Volumes (MOD) LV vol d, MOD A2C: 83.5 ml LV vol d, MOD A4C: 54.1 ml LV vol s, MOD A2C: 16.6 ml LV vol s, MOD A4C: 16.4 ml LV SV MOD A2C:     66.9 ml LV SV MOD A4C:     54.1 ml LV SV MOD BP:      49.8 ml RIGHT VENTRICLE            IVC RV S prime:     8.05 cm/s  IVC diam: 2.20 cm TAPSE (M-mode): 1.9 cm LEFT ATRIUM             Index        RIGHT ATRIUM           Index LA diam:        4.30 cm 2.40 cm/m   RA Area:  13.60 cm LA Vol (A2C):   60.2 ml 33.56 ml/m  RA Volume:   29.70 ml  16.56 ml/m LA Vol (A4C):   64.7 ml 36.07 ml/m LA Biplane Vol: 62.4 ml 34.79 ml/m  AORTIC VALVE AV Area (Vmax):    0.90 cm AV Area (Vmean):   0.88 cm AV Area (VTI):     1.01 cm AV Vmax:           230.80 cm/s AV Vmean:          159.800 cm/s AV VTI:            0.567 m AV Peak Grad:      21.3 mmHg AV Mean Grad:      12.0 mmHg LVOT Vmax:         91.80 cm/s LVOT Vmean:        62.300 cm/s LVOT VTI:          0.252 m LVOT/AV VTI ratio: 0.44  AORTA Ao Root diam: 3.00 cm Ao Asc diam:  3.00 cm MITRAL VALVE                TRICUSPID VALVE MV Area (PHT): 4.24 cm     TR Peak grad:   32.9 mmHg MV Decel Time: 179 msec     TR Vmax:        287.00 cm/s MV E velocity: 118.00 cm/s                             SHUNTS                             Systemic VTI:  0.25 m                             Systemic Diam: 1.70 cm Jenkins Rouge MD Electronically signed by Jenkins Rouge MD Signature Date/Time: 10/16/2022/3:54:03 PM    Final      Scheduled Meds:  apixaban  2.5 mg Oral BID   atorvastatin  40 mg Oral  Daily   Chlorhexidine Gluconate Cloth  6 each Topical Daily   clopidogrel  75 mg Oral QPM   docusate sodium  100 mg Oral BID   feeding supplement  237 mL Oral BID BM   furosemide  80 mg Intravenous BID   insulin aspart  0-15 Units Subcutaneous TID WC   insulin aspart  0-5 Units Subcutaneous QHS   letrozole  2.5 mg Oral Daily   levETIRAcetam  500 mg Oral BID   melatonin  3 mg Oral QHS   metoprolol succinate  12.5 mg Oral Daily   polyethylene glycol  17 g Oral Daily   psyllium  1 packet Oral Daily   sodium bicarbonate  650 mg Oral BID   sodium chloride flush  3 mL Intravenous Q12H   Continuous Infusions:   LOS: 2 days    Time spent: 68mn    PDomenic Polite MD [facility]   10/18/2022, 9:08 AM

## 2022-10-18 NOTE — Progress Notes (Signed)
Rounding Note    Patient Name: [name] Date of Encounter: 10/18/2022  [place] Cardiologist: Mertie Moores, MD   Subjective   Denies any chest pain, still short of breath. Primary complaint is heel pain.  Inpatient Medications    Scheduled Meds:  apixaban  2.5 mg Oral BID   atorvastatin  40 mg Oral Daily   Chlorhexidine Gluconate Cloth  6 each Topical Daily   clopidogrel  75 mg Oral QPM   docusate sodium  100 mg Oral BID   feeding supplement  237 mL Oral BID BM   furosemide  80 mg Intravenous BID   insulin aspart  0-15 Units Subcutaneous TID WC   insulin aspart  0-5 Units Subcutaneous QHS   letrozole  2.5 mg Oral Daily   levETIRAcetam  500 mg Oral BID   melatonin  3 mg Oral QHS   metoprolol succinate  12.5 mg Oral Daily   polyethylene glycol  17 g Oral Daily   psyllium  1 packet Oral Daily   sodium bicarbonate  650 mg Oral BID   sodium chloride flush  3 mL Intravenous Q12H   Continuous Infusions:   PRN Meds: acetaminophen **OR** acetaminophen, bisacodyl, fluticasone furoate-vilanterol, hydrALAZINE, ipratropium-albuterol, LORazepam, methocarbamol, ondansetron **OR** ondansetron (ZOFRAN) IV, oxyCODONE, traZODone   Vital Signs    Vitals:   10/17/22 1658 10/17/22 1935 10/18/22 0628 10/18/22 0734  BP: 117/64 (!) 115/48 (!) 142/52 (!) 116/93  Pulse: 65 75 75 83  Resp: 20 (!) 23 (!) 22 (!) 23  Temp: 98.7 F (37.1 C) 98.7 F (37.1 C) 98.7 F (37.1 C) 97.7 F (36.5 C)  TempSrc: Oral Oral Oral Oral  SpO2: 95% 96% 96% 92%  Weight:   76.6 kg   Height:        Intake/Output Summary (Last 24 hours) at 10/18/2022 1040 Last data filed at 10/18/2022 0839 Gross per 24 hour  Intake 240 ml  Output 1075 ml  Net -835 ml      10/18/2022    6:28 AM 10/17/2022    4:00 AM 10/16/2022    9:04 PM  Last 3 Weights  Weight (lbs) 168 lb 14 oz 171 lb 8 oz 172 lb 2.9 oz  Weight (kg) 76.6 kg 77.792 kg 78.1 kg      Telemetry    Atrial fibrillation, rate  controlled - Personally Reviewed  ECG    Atrial fibrillation - Personally Reviewed  Physical Exam   GEN: No acute distress.   Neck: No JVD Cardiac: irregular, no murmurs, rubs, or gallops.  Respiratory: diminished breath sound in the right base.  GI: Soft, nontender, non-distended  MS: No edema; No deformity. Neuro:  Nonfocal  Psych: Normal affect   Labs    High Sensitivity Troponin:   Recent Labs  Lab 10/16/22 0342 10/16/22 0510  TROPONINIHS 10 12     Chemistry Recent Labs  Lab 10/16/22 0342 10/16/22 0510 10/17/22 0315 10/18/22 0544  NA 140  --  143 145  K 4.4  --  4.4 4.5  CL 111  --  116* 115*  CO2 19*  --  19* 21*  GLUCOSE 90  --  122* 113*  BUN 78*  --  79* 79*  CREATININE 3.26*  --  3.01* 2.79*  CALCIUM 6.9*  --  6.6* 6.7*  PROT  --  5.6*  --   --   ALBUMIN  --  2.8*  --   --   AST  --  19  --   --  ALT  --  9  --   --   ALKPHOS  --  116  --   --   BILITOT  --  0.6  --   --   GFRNONAA 14*  --  15* 17*  ANIONGAP 10  --  8 9    Lipids No results for input(s): "CHOL", "TRIG", "HDL", "LABVLDL", "LDLCALC", "CHOLHDL" in the last 168 hours.  Hematology Recent Labs  Lab 10/16/22 0342 10/17/22 0315  WBC 4.7 4.3  RBC 2.94* 2.91*  HGB 9.2* 9.0*  HCT 28.9* 28.1*  MCV 98.3 96.6  MCH 31.3 30.9  MCHC 31.8 32.0  RDW 15.9* 15.7*  PLT 136* 122*   Thyroid No results for input(s): "TSH", "FREET4" in the last 168 hours.  BNP Recent Labs  Lab 10/16/22 0342  BNP 1,198.5*    DDimer No results for input(s): "DDIMER" in the last 168 hours.   Radiology    US RENAL  Result Date: 10/16/2022 CLINICAL DATA:  AK I EXAM: RENAL / URINARY TRACT ULTRASOUND COMPLETE COMPARISON:  Renal ultrasound dated 08/31/2022 FINDINGS: Right Kidney: Renal measurements: 10.3 x 5.2 x 4.0 cm = volume: 109.8 mL (previously 160 ml). Echogenicity is slightly increased. No hydronephrosis visualized. Within the right kidney there is a 1.4 cm anechoic lesion, compatible with a simple renal  cyst, and unchanged from prior exam. Left Kidney: Renal measurements: 9.3 x 4.6 x 4.3 cm = volume: 95.5 mL (previously 145 ml). Echogenicity is slightly increased. No hydronephrosis visualized. Within left kidney, there are newly visualized anechoic lesions along the upper pole measuring up to 1.1 cm and 0.7 cm, which is also likely represent renal cysts and likely poorly visualized on prior exam due to poor visualization of the left kidney. Bladder: Appears normal for degree of bladder distention. Other: Incidentally noted are bilateral pleural effusions and possible small amount of perihepatic ascites. IMPRESSION: 1. No hydronephrosis or nephrolithiasis. 2. Findings suggestive of medical renal disease. 3. Incidentally noted are small bilateral pleural effusions and possible perihepatic ascites, new from prior. Electronically Signed   By: Marin Roberts M.D.   On: 10/16/2022 17:41   ECHOCARDIOGRAM COMPLETE  Result Date: 10/16/2022    ECHOCARDIOGRAM REPORT   Patient Name:   [name] Date of Exam: 10/16/2022 Medical Rec #:  [id number]     Height:       62.0 in Accession #:    [id number]    Weight:       172.2 lb Date of Birth:  Jul 03, 1941     BSA:          1.794 m Patient Age:    81 years      BP:           131/66 mmHg Patient Gender: F             HR:           50 bpm. Exam Location:  Inpatient Procedure: 2D Echo, 3D Echo, Cardiac Doppler and Color Doppler Indications:    I50.40* Unspecified combined systolic (congestive) and diastolic                 (congestive) heart failure  History:        Patient has prior history of Echocardiogram examinations, most                 recent 04/17/2022. CHF, CAD, Abnormal ECG, TIA, Aortic Valve  Disease, Arrythmias:Atrial Fibrillation and Atrial Flutter,                 Signs/Symptoms:Chest Pain; Risk Factors:Hypertension,                 Dyslipidemia and Diabetes. Aortic stenosis. Breast cancer.                 Seizure.  Sonographer:    Roseanna Rainbow RDCS  Referring Phys: [id number] JENNIFER YATES  Sonographer Comments: Technically difficult study due to poor echo windows. Image acquisition challenging due to patient body habitus. IMPRESSIONS  1. Left ventricular ejection fraction, by estimation, is 60 to 65%. The left ventricle has normal function. The left ventricle has no regional wall motion abnormalities. There is mild left ventricular hypertrophy. Left ventricular diastolic parameters are indeterminate.  2. Right ventricular systolic function is normal. The right ventricular size is normal. There is mildly elevated pulmonary artery systolic pressure.  3. Left atrial size was moderately dilated.  4. The mitral valve is degenerative. Mild mitral valve regurgitation. No evidence of mitral stenosis. Severe mitral annular calcification.  5. Tricuspid valve regurgitation is moderate to severe.  6. The aortic valve is normal in structure. There is severe calcifcation of the aortic valve. There is severe thickening of the aortic valve. Aortic valve regurgitation is not visualized. Mild to moderate aortic valve stenosis.  7. The inferior vena cava is normal in size with greater than 50% respiratory variability, suggesting right atrial pressure of 3 mmHg. FINDINGS  Left Ventricle: Left ventricular ejection fraction, by estimation, is 60 to 65%. The left ventricle has normal function. The left ventricle has no regional wall motion abnormalities. The left ventricular internal cavity size was normal in size. There is  mild left ventricular hypertrophy. Left ventricular diastolic parameters are indeterminate. Right Ventricle: The right ventricular size is normal. No increase in right ventricular wall thickness. Right ventricular systolic function is normal. There is mildly elevated pulmonary artery systolic pressure. The tricuspid regurgitant velocity is 2.87  m/s, and with an assumed right atrial pressure of 8 mmHg, the estimated right ventricular systolic pressure is 14.4 mmHg.  Left Atrium: Left atrial size was moderately dilated. Right Atrium: Right atrial size was normal in size. Pericardium: Trivial pericardial effusion is present. The pericardial effusion is posterior to the left ventricle. Mitral Valve: The mitral valve is degenerative in appearance. There is severe thickening of the mitral valve leaflet(s). There is severe calcification of the mitral valve leaflet(s). Severe mitral annular calcification. Mild mitral valve regurgitation. No evidence of mitral valve stenosis. Tricuspid Valve: The tricuspid valve is normal in structure. Tricuspid valve regurgitation is moderate to severe. No evidence of tricuspid stenosis. Aortic Valve: The aortic valve is normal in structure. There is severe calcifcation of the aortic valve. There is severe thickening of the aortic valve. Aortic valve regurgitation is not visualized. Mild to moderate aortic stenosis is present. Aortic valve mean gradient measures 12.0 mmHg. Aortic valve peak gradient measures 21.3 mmHg. Aortic valve area, by VTI measures 1.01 cm. Pulmonic Valve: The pulmonic valve was normal in structure. Pulmonic valve regurgitation is trivial. No evidence of pulmonic stenosis. Aorta: The aortic root is normal in size and structure. Venous: The inferior vena cava is normal in size with greater than 50% respiratory variability, suggesting right atrial pressure of 3 mmHg. IAS/Shunts: The interatrial septum appears to be lipomatous. No atrial level shunt detected by color flow Doppler.  LEFT VENTRICLE PLAX 2D LVIDd:  4.00 cm LVIDs:         2.90 cm LV PW:         1.30 cm LV IVS:        1.30 cm LVOT diam:     1.70 cm     3D Volume EF: LV SV:         57          3D EF:        51 % LV SV Index:   32          LV EDV:       125 ml LVOT Area:     2.27 cm    LV ESV:       62 ml                            LV SV:        64 ml  LV Volumes (MOD) LV vol d, MOD A2C: 83.5 ml LV vol d, MOD A4C: 54.1 ml LV vol s, MOD A2C: 16.6 ml LV vol s, MOD  A4C: 16.4 ml LV SV MOD A2C:     66.9 ml LV SV MOD A4C:     54.1 ml LV SV MOD BP:      49.8 ml RIGHT VENTRICLE            IVC RV S prime:     8.05 cm/s  IVC diam: 2.20 cm TAPSE (M-mode): 1.9 cm LEFT ATRIUM             Index        RIGHT ATRIUM           Index LA diam:        4.30 cm 2.40 cm/m   RA Area:     13.60 cm LA Vol (A2C):   60.2 ml 33.56 ml/m  RA Volume:   29.70 ml  16.56 ml/m LA Vol (A4C):   64.7 ml 36.07 ml/m LA Biplane Vol: 62.4 ml 34.79 ml/m  AORTIC VALVE AV Area (Vmax):    0.90 cm AV Area (Vmean):   0.88 cm AV Area (VTI):     1.01 cm AV Vmax:           230.80 cm/s AV Vmean:          159.800 cm/s AV VTI:            0.567 m AV Peak Grad:      21.3 mmHg AV Mean Grad:      12.0 mmHg LVOT Vmax:         91.80 cm/s LVOT Vmean:        62.300 cm/s LVOT VTI:          0.252 m LVOT/AV VTI ratio: 0.44  AORTA Ao Root diam: 3.00 cm Ao Asc diam:  3.00 cm MITRAL VALVE                TRICUSPID VALVE MV Area (PHT): 4.24 cm     TR Peak grad:   32.9 mmHg MV Decel Time: 179 msec     TR Vmax:        287.00 cm/s MV E velocity: 118.00 cm/s                             SHUNTS  Systemic VTI:  0.25 m                             Systemic Diam: 1.70 cm Jenkins Rouge MD Electronically signed by Jenkins Rouge MD Signature Date/Time: 10/16/2022/3:54:03 PM    Final     Cardiac Studies   Echo 10/16/2022  1. Left ventricular ejection fraction, by estimation, is 60 to 65%. The  left ventricle has normal function. The left ventricle has no regional  wall motion abnormalities. There is mild left ventricular hypertrophy.  Left ventricular diastolic parameters  are indeterminate.   2. Right ventricular systolic function is normal. The right ventricular  size is normal. There is mildly elevated pulmonary artery systolic  pressure.   3. Left atrial size was moderately dilated.   4. The mitral valve is degenerative. Mild mitral valve regurgitation. No  evidence of mitral stenosis. Severe mitral  annular calcification.   5. Tricuspid valve regurgitation is moderate to severe.   6. The aortic valve is normal in structure. There is severe calcifcation  of the aortic valve. There is severe thickening of the aortic valve.  Aortic valve regurgitation is not visualized. Mild to moderate aortic  valve stenosis.   7. The inferior vena cava is normal in size with greater than 50%  respiratory variability, suggesting right atrial pressure of 3 mmHg.    Patient Profile     81 y.o. female with PMH of CAD s/p PCI-RCA (not CABG candidate), chronic diastolic CHF, HTN, HLD, atrial flutter, COPD, AML in remission, breast CA, uterine CA with possible mets on recent imaging, CKD III and cirrhosis presented with worsening LE edema  Assessment & Plan    Acute on chronic diastolic heart failure/LE edema  - Echocardiogram 10/16/2022 EF 60-65%, mildly elevated PASP, mild MR, moderate to severe TR, mild to moderate AS  - Renal doppler 10/16/2022 showed no hydronephrosis or nephrolithiasis, medical renal disease  - LE edema is likely multifactorial with hypoalbuminemia, moderate to severe TR and cirrhosis  - UOP is not accurate. Purewick has been on and off. There are multiple unrecorded voids. I agree with continuing lasix IV 80 BID.  R pleural effusion: at least moderate pleural effusion on the right side, likely contributing to her SOB, repeat CXR tomorrow AM, may need thoracentesis  Acute on chronic renal insufficiency: previous baseline 1.8, came in with Cr of 3.26. Home jardiance, valsartan and torsemide held. Cr improving.  Hypertension  HLD  Persistent atrial fibrillation: on Eliquis. Rate controlled.       For questions or updates, please contact [name] Please consult [email or web address] for contact info under    Melida Quitter, MD 10/18/22 10:40 AM

## 2022-10-19 DIAGNOSIS — I5033 Acute on chronic diastolic (congestive) heart failure: Secondary | ICD-10-CM | POA: Diagnosis not present

## 2022-10-19 LAB — GLUCOSE, CAPILLARY
Glucose-Capillary: 109 mg/dL — ABNORMAL HIGH (ref 70–99)
Glucose-Capillary: 213 mg/dL — ABNORMAL HIGH (ref 70–99)
Glucose-Capillary: 169 mg/dL — ABNORMAL HIGH (ref 70–99)
Glucose-Capillary: 242 mg/dL — ABNORMAL HIGH (ref 70–99)

## 2022-10-19 LAB — COMPREHENSIVE METABOLIC PANEL
ALT: 9 U/L (ref 0–44)
AST: 13 U/L — ABNORMAL LOW (ref 15–41)
Albumin: 3 g/dL — ABNORMAL LOW (ref 3.5–5.0)
Alkaline Phosphatase: 100 U/L (ref 38–126)
Anion gap: 10 (ref 5–15)
BUN: 79 mg/dL — ABNORMAL HIGH (ref 8–23)
CO2: 21 mmol/L — ABNORMAL LOW (ref 22–32)
Calcium: 6.7 mg/dL — ABNORMAL LOW (ref 8.9–10.3)
Chloride: 114 mmol/L — ABNORMAL HIGH (ref 98–111)
Creatinine, Ser: 2.53 mg/dL — ABNORMAL HIGH (ref 0.44–1.00)
GFR, Estimated: 19 mL/min — ABNORMAL LOW (ref >60)
Glucose, Bld: 114 mg/dL — ABNORMAL HIGH (ref 70–99)
Potassium: 4.5 mmol/L (ref 3.5–5.1)
Sodium: 145 mmol/L (ref 135–145)
Total Bilirubin: 0.6 mg/dL (ref 0.3–1.2)
Total Protein: 5.5 g/dL — ABNORMAL LOW (ref 6.5–8.1)

## 2022-10-19 LAB — CBC
HCT: 29.3 % — ABNORMAL LOW (ref 36.0–46.0)
Hemoglobin: 9.4 g/dL — ABNORMAL LOW (ref 12.0–15.0)
MCH: 31.5 pg (ref 26.0–34.0)
MCHC: 32.1 g/dL (ref 30.0–36.0)
MCV: 98.3 fL (ref 80.0–100.0)
Platelets: 115 10*3/uL — ABNORMAL LOW (ref 150–400)
RBC: 2.98 MIL/uL — ABNORMAL LOW (ref 3.87–5.11)
RDW: 16.2 % — ABNORMAL HIGH (ref 11.5–15.5)
WBC: 6.1 10*3/uL (ref 4.0–10.5)
nRBC: 0 % (ref 0.0–0.2)

## 2022-10-19 MED ORDER — METOLAZONE 2.5 MG PO TABS
2.5000 mg | ORAL_TABLET | Freq: Once | ORAL | Status: AC
  Administered 2022-10-19: 2.5 mg via ORAL
  Filled 2022-10-19: qty 1

## 2022-10-19 NOTE — Progress Notes (Addendum)
Rounding Note    Patient Name: [name] Date of Encounter: 10/19/2022  [place] Cardiologist: Mertie Moores, MD   Subjective   Denies any chest pain, still short of breath. Primary complaint is heel pain.  Inpatient Medications    Scheduled Meds:  apixaban  2.5 mg Oral BID   atorvastatin  40 mg Oral Daily   Chlorhexidine Gluconate Cloth  6 each Topical Daily   clopidogrel  75 mg Oral QPM   docusate sodium  100 mg Oral BID   feeding supplement  237 mL Oral BID BM   furosemide  80 mg Intravenous BID   insulin aspart  0-15 Units Subcutaneous TID WC   insulin aspart  0-5 Units Subcutaneous QHS   letrozole  2.5 mg Oral Daily   levETIRAcetam  500 mg Oral BID   melatonin  3 mg Oral QHS   metoprolol succinate  12.5 mg Oral Daily   polyethylene glycol  17 g Oral Daily   psyllium  1 packet Oral Daily   sodium bicarbonate  650 mg Oral BID   sodium chloride flush  3 mL Intravenous Q12H   Continuous Infusions:   PRN Meds: acetaminophen **OR** acetaminophen, bisacodyl, fluticasone furoate-vilanterol, hydrALAZINE, ipratropium-albuterol, LORazepam, methocarbamol, ondansetron **OR** ondansetron (ZOFRAN) IV, oxyCODONE, traZODone   Vital Signs    Vitals:   10/18/22 0734 10/18/22 1957 10/19/22 0344 10/19/22 1019  BP: (!) 116/93 125/65 111/71 (!) 138/59  Pulse: 83 72 69   Resp: (!) 23 (!) '25 20 20  '$ Temp: 97.7 F (36.5 C) (!) 97.5 F (36.4 C) 97.7 F (36.5 C)   TempSrc: Oral Oral Oral   SpO2: 92% 93% 100%   Weight:   77.5 kg   Height:        Intake/Output Summary (Last 24 hours) at 10/19/2022 1108 Last data filed at 10/19/2022 1020 Gross per 24 hour  Intake 540 ml  Output 1550 ml  Net -1010 ml      10/19/2022    3:44 AM 10/18/2022    6:28 AM 10/17/2022    4:00 AM  Last 3 Weights  Weight (lbs) 170 lb 13.7 oz 168 lb 14 oz 171 lb 8 oz  Weight (kg) 77.5 kg 76.6 kg 77.792 kg      Telemetry    Atrial fibrillation, rate controlled. One episode of  NSVT this AM - Personally Reviewed  ECG    Atrial fibrillation - Personally Reviewed  Physical Exam   GEN: No acute distress.   Neck: No JVD Cardiac: irregular, no murmurs, rubs, or gallops.  Respiratory: diminished breath sound in the right base.  GI: Soft, nontender, non-distended  MS: 2+ edema; No deformity. Neuro:  Nonfocal  Psych: Normal affect   Labs    High Sensitivity Troponin:   Recent Labs  Lab 10/16/22 0342 10/16/22 0510  TROPONINIHS 10 12     Chemistry Recent Labs  Lab 10/16/22 0510 10/17/22 0315 10/18/22 0544 10/19/22 0530  NA  --  143 145 145  K  --  4.4 4.5 4.5  CL  --  116* 115* 114*  CO2  --  19* 21* 21*  GLUCOSE  --  122* 113* 114*  BUN  --  79* 79* 79*  CREATININE  --  3.01* 2.79* 2.53*  CALCIUM  --  6.6* 6.7* 6.7*  PROT 5.6*  --   --  5.5*  ALBUMIN 2.8*  --   --  3.0*  AST 19  --   --  13*  ALT 9  --   --  9  ALKPHOS 116  --   --  100  BILITOT 0.6  --   --  0.6  GFRNONAA  --  15* 17* 19*  ANIONGAP  --  '8 9 10    '$ Lipids No results for input(s): "CHOL", "TRIG", "HDL", "LABVLDL", "LDLCALC", "CHOLHDL" in the last 168 hours.  Hematology Recent Labs  Lab 10/16/22 0342 10/17/22 0315 10/19/22 0530  WBC 4.7 4.3 6.1  RBC 2.94* 2.91* 2.98*  HGB 9.2* 9.0* 9.4*  HCT 28.9* 28.1* 29.3*  MCV 98.3 96.6 98.3  MCH 31.3 30.9 31.5  MCHC 31.8 32.0 32.1  RDW 15.9* 15.7* 16.2*  PLT 136* 122* 115*   Thyroid No results for input(s): "TSH", "FREET4" in the last 168 hours.  BNP Recent Labs  Lab 10/16/22 0342  BNP 1,198.5*    DDimer No results for input(s): "DDIMER" in the last 168 hours.   Radiology    DG CHEST PORT 1 VIEW  Result Date: 10/18/2022 CLINICAL DATA:  Dyspnea.  History of CHF EXAM: PORTABLE CHEST 1 VIEW COMPARISON:  10/16/2022 FINDINGS: Right Port-A-Cath tip at superior caval/atrial junction. Midline trachea. Mild cardiomegaly. Small right pleural effusion is unchanged. No pneumothorax. No congestive failure. Right greater than left  base airspace disease is similar. IMPRESSION: 1. No significant change in right greater than left base atelectasis with small right pleural effusion. Given chronicity of right effusion, recommend radiographic follow-up until clearing to exclude an underlying obstructive mass. 2.   Cardiomegaly without congestive failure. Electronically Signed   By: Abigail Miyamoto M.D.   On: 10/18/2022 08:46    Cardiac Studies   Echo 10/16/2022  1. Left ventricular ejection fraction, by estimation, is 60 to 65%. The  left ventricle has normal function. The left ventricle has no regional  wall motion abnormalities. There is mild left ventricular hypertrophy.  Left ventricular diastolic parameters  are indeterminate.   2. Right ventricular systolic function is normal. The right ventricular  size is normal. There is mildly elevated pulmonary artery systolic  pressure.   3. Left atrial size was moderately dilated.   4. The mitral valve is degenerative. Mild mitral valve regurgitation. No  evidence of mitral stenosis. Severe mitral annular calcification.   5. Tricuspid valve regurgitation is moderate to severe.   6. The aortic valve is normal in structure. There is severe calcifcation  of the aortic valve. There is severe thickening of the aortic valve.  Aortic valve regurgitation is not visualized. Mild to moderate aortic  valve stenosis.   7. The inferior vena cava is normal in size with greater than 50%  respiratory variability, suggesting right atrial pressure of 3 mmHg.    Patient Profile     81 y.o. female with PMH of CAD s/p PCI-RCA (not CABG candidate), chronic diastolic CHF, HTN, HLD, atrial flutter, COPD, AML in remission, breast CA, uterine CA with possible mets on recent imaging, CKD III and cirrhosis presented with worsening LE edema  Assessment & Plan    Acute on chronic diastolic heart failure/LE edema  - Echocardiogram 10/16/2022 EF 60-65%, mildly elevated PASP, mild MR, moderate to severe TR,  mild to moderate AS  - Renal doppler 10/16/2022 showed no hydronephrosis or nephrolithiasis, medical renal disease  - LE edema is likely multifactorial with hypoalbuminemia, moderate to severe TR and cirrhosis  - UOP is not accurate; weight up today. Continue aggressive diuresis.  R pleural effusion: at least moderate pleural effusion on the  right side, likely contributing to her SOB, repeat CXR tomorrow AM, may need thoracentesis  Acute on chronic renal insufficiency: previous baseline 1.8, came in with Cr of 3.26. Home jardiance, valsartan and torsemide held. Cr improving.  Hypertension  HLD  Persistent atrial fibrillation: on Eliquis. Rate controlled.       For questions or updates, please contact [place] Please consult [email or web address] for contact info under    Melida Quitter, MD 10/19/22 11:08 AM

## 2022-10-19 NOTE — Progress Notes (Signed)
PROGRESS NOTE    [name]  UXL:[id number] DOB: 03-Aug-1941 DOA: 10/16/2022 PCP: Isaac Bliss, Rayford Halsted, MD   52 /F w/ h/o of AML (remote); afib on Eliquis; chronic back pain; COPD; CAD; DM; HTN; HLD; chronic diastolic CHF; uterine/breast cancer; and CAD presenting with edema.  Recent hip fracture, dc on 10/26 from CIR. She was swollen and had trouble lifting her feet because they were too heavy.  Her legs feel like they weigh a ton and she is mostly in the wheelchair.  Her arms are also swollen and "will leak water out of them."  Some SOB  ER Course: BNP 1198, Creat 3.2, hb 9.2  Subjective: -starting to improve, urinated a lot, but not recorded  Assessment and Plan:  Acute on chronic diastolic CHF -Patient with known h/o chronic diastolic CHF presenting with worsening edema, developing anasarca, profound upper thigh swelling -Echo on 5/23 with preserved EF; repeat echo unchanged, RV function is normal -hypoalbuminemia contributing -Previously started on Entresto but this was stopped due to "itching" and changed to valsartan, Hold Jardiance -continue current dose of IV lasix '80mg'$  BID, poor urine output recorded, will add metolazone today -GDMT limited by AKI/CKD -Repeat chest x-ray with small right pleural effusion, does not need thoracentesis -Cards consulting, BMP in am -OOB, PT eval   AKI on Stage 4 CKD -Likely cardiorenal, baseline creat 1.8-2, 3.2 on admission -Now improving, monitor with diuresis -Renal ultrasound without hydronephrosis, avoid hypotension -Hold Jardiance and valsartan   Afib -Continue Toprol XL for rate control -Continue Eliquis   H/o AML and recent breast cancer -Followed by Dr. [name], last seen 7/24 -She is in clinical remission and osseous changes on prior imaging are thought to be residual from AML treatment  -She has a port-a-cath because she wanted to keep it, currently accessed -Continue letrozole for now   Seizure d/o -Continue  Keppra   COPD -Uses Breo prn; will continue   HTN -Continue metoprolol, hold Imdur -Hold valsatran, Jardiance   HLD -Continue Lipitor   DM -Last A1c was 9.9, indicating poor control -Hold glargine for now -SSI   Chronic pain -Continue Ativan, Robaxin, Oxy per home regimen   CAD -s/p stent,  Too high risk for CABG in 2018 -Continue Imdur, Plavix    DVT prophylaxis: apixaban Code Status: DNR Family Communication: None present Disposition Plan: May need rehab again, will request PT OT eval  Consultants: Cards   Procedures:   Antimicrobials:    Objective: Vitals:   10/18/22 0734 10/18/22 1957 10/19/22 0344 10/19/22 1019  BP: (!) 116/93 125/65 111/71 (!) 138/59  Pulse: 83 72 69   Resp: (!) 23 (!) '25 20 20  '$ Temp: 97.7 F (36.5 C) (!) 97.5 F (36.4 C) 97.7 F (36.5 C)   TempSrc: Oral Oral Oral   SpO2: 92% 93% 100%   Weight:   77.5 kg   Height:        Intake/Output Summary (Last 24 hours) at 10/19/2022 1026 Last data filed at 10/19/2022 1020 Gross per 24 hour  Intake 540 ml  Output 1550 ml  Net -1010 ml   Filed Weights   10/17/22 0400 10/18/22 0628 10/19/22 0344  Weight: 77.8 kg 76.6 kg 77.5 kg    Examination:  General exam: Obese pleasant female sitting up in bed, AAOx3, no distress HEENT: Positive JVD CVS: S1-S2, irregularly irregular rhythm Lungs: Decreased breath sounds the bases Abdomen: Soft, nontender, bowel sounds present Extremities: 2+ edema predominantly in upper thigh and flank Psychiatry:  Mood & affect appropriate.     Data Reviewed:   CBC: Recent Labs  Lab 10/16/22 0342 10/17/22 0315 10/19/22 0530  WBC 4.7 4.3 6.1  NEUTROABS 3.1  --   --   HGB 9.2* 9.0* 9.4*  HCT 28.9* 28.1* 29.3*  MCV 98.3 96.6 98.3  PLT 136* 122* 027*   Basic Metabolic Panel: Recent Labs  Lab 10/16/22 0342 10/17/22 0315 10/18/22 0544 10/19/22 0530  NA 140 143 145 145  K 4.4 4.4 4.5 4.5  CL 111 116* 115* 114*  CO2 19* 19* 21* 21*  GLUCOSE  90 122* 113* 114*  BUN 78* 79* 79* 79*  CREATININE 3.26* 3.01* 2.79* 2.53*  CALCIUM 6.9* 6.6* 6.7* 6.7*   GFR: Estimated Creatinine Clearance: 17.1 mL/min (A) (by C-G formula based on SCr of 2.53 mg/dL (H)). Liver Function Tests: Recent Labs  Lab 10/16/22 0510 10/19/22 0530  AST 19 13*  ALT 9 9  ALKPHOS 116 100  BILITOT 0.6 0.6  PROT 5.6* 5.5*  ALBUMIN 2.8* 3.0*   No results for input(s): "LIPASE", "AMYLASE" in the last 168 hours. No results for input(s): "AMMONIA" in the last 168 hours. Coagulation Profile: No results for input(s): "INR", "PROTIME" in the last 168 hours. Cardiac Enzymes: No results for input(s): "CKTOTAL", "CKMB", "CKMBINDEX", "TROPONINI" in the last 168 hours. BNP (last 3 results) No results for input(s): "PROBNP" in the last 8760 hours. HbA1C: No results for input(s): "HGBA1C" in the last 72 hours. CBG: Recent Labs  Lab 10/18/22 0626 10/18/22 1141 10/18/22 1538 10/18/22 2204 10/19/22 0614  GLUCAP 103* 185* 141* 116* 109*   Lipid Profile: No results for input(s): "CHOL", "HDL", "LDLCALC", "TRIG", "CHOLHDL", "LDLDIRECT" in the last 72 hours. Thyroid Function Tests: No results for input(s): "TSH", "T4TOTAL", "FREET4", "T3FREE", "THYROIDAB" in the last 72 hours. Anemia Panel: No results for input(s): "VITAMINB12", "FOLATE", "FERRITIN", "TIBC", "IRON", "RETICCTPCT" in the last 72 hours. Urine analysis:    Component Value Date/Time   COLORURINE YELLOW 09/18/2022 1302   APPEARANCEUR TURBID (A) 09/18/2022 1302   LABSPEC 1.008 09/18/2022 1302   LABSPEC 1.015 08/04/2014 1439   PHURINE 5.0 09/18/2022 1302   GLUCOSEU >=500 (A) 09/18/2022 1302   GLUCOSEU Negative 08/04/2014 1439   HGBUR MODERATE (A) 09/18/2022 1302   HGBUR negative 10/10/2010 1607   BILIRUBINUR NEGATIVE 09/18/2022 1302   BILIRUBINUR Negative 08/04/2014 1439   KETONESUR NEGATIVE 09/18/2022 1302   PROTEINUR NEGATIVE 09/18/2022 1302   UROBILINOGEN 0.2 08/04/2014 1439   NITRITE  POSITIVE (A) 09/18/2022 1302   LEUKOCYTESUR LARGE (A) 09/18/2022 1302   LEUKOCYTESUR Negative 08/04/2014 1439   Sepsis Labs: '@LABRCNTIP'$ (procalcitonin:4,lacticidven:4)  )No results found for this or any previous visit (from the past 240 hour(s)).   Radiology Studies: DG CHEST PORT 1 VIEW  Result Date: 10/18/2022 CLINICAL DATA:  Dyspnea.  History of CHF EXAM: PORTABLE CHEST 1 VIEW COMPARISON:  10/16/2022 FINDINGS: Right Port-A-Cath tip at superior caval/atrial junction. Midline trachea. Mild cardiomegaly. Small right pleural effusion is unchanged. No pneumothorax. No congestive failure. Right greater than left base airspace disease is similar. IMPRESSION: 1. No significant change in right greater than left base atelectasis with small right pleural effusion. Given chronicity of right effusion, recommend radiographic follow-up until clearing to exclude an underlying obstructive mass. 2.   Cardiomegaly without congestive failure. Electronically Signed   By: Abigail Miyamoto M.D.   On: 10/18/2022 08:46     Scheduled Meds:  apixaban  2.5 mg Oral BID   atorvastatin  40 mg Oral Daily  Chlorhexidine Gluconate Cloth  6 each Topical Daily   clopidogrel  75 mg Oral QPM   docusate sodium  100 mg Oral BID   feeding supplement  237 mL Oral BID BM   furosemide  80 mg Intravenous BID   insulin aspart  0-15 Units Subcutaneous TID WC   insulin aspart  0-5 Units Subcutaneous QHS   letrozole  2.5 mg Oral Daily   levETIRAcetam  500 mg Oral BID   melatonin  3 mg Oral QHS   metoprolol succinate  12.5 mg Oral Daily   polyethylene glycol  17 g Oral Daily   psyllium  1 packet Oral Daily   sodium bicarbonate  650 mg Oral BID   sodium chloride flush  3 mL Intravenous Q12H   Continuous Infusions:   LOS: 3 days    Time spent: 71mn    PDomenic Polite MD [facility]   10/19/2022, 10:26 AM

## 2022-10-20 DIAGNOSIS — N179 Acute kidney failure, unspecified: Secondary | ICD-10-CM

## 2022-10-20 DIAGNOSIS — I5033 Acute on chronic diastolic (congestive) heart failure: Secondary | ICD-10-CM | POA: Diagnosis not present

## 2022-10-20 LAB — GLUCOSE, CAPILLARY
Glucose-Capillary: 201 mg/dL — ABNORMAL HIGH (ref 70–99)
Glucose-Capillary: 237 mg/dL — ABNORMAL HIGH (ref 70–99)
Glucose-Capillary: 131 mg/dL — ABNORMAL HIGH (ref 70–99)
Glucose-Capillary: 44 mg/dL — CL (ref 70–99)
Glucose-Capillary: 47 mg/dL — ABNORMAL LOW (ref 70–99)
Glucose-Capillary: 70 mg/dL (ref 70–99)

## 2022-10-20 LAB — BASIC METABOLIC PANEL
Anion gap: 12 (ref 5–15)
BUN: 78 mg/dL — ABNORMAL HIGH (ref 8–23)
CO2: 22 mmol/L (ref 22–32)
Calcium: 6.9 mg/dL — ABNORMAL LOW (ref 8.9–10.3)
Chloride: 111 mmol/L (ref 98–111)
Creatinine, Ser: 2.33 mg/dL — ABNORMAL HIGH (ref 0.44–1.00)
GFR, Estimated: 21 mL/min — ABNORMAL LOW (ref >60)
Glucose, Bld: 175 mg/dL — ABNORMAL HIGH (ref 70–99)
Potassium: 4.1 mmol/L (ref 3.5–5.1)
Sodium: 145 mmol/L (ref 135–145)

## 2022-10-20 LAB — MAGNESIUM: Magnesium: 1.7 mg/dL (ref 1.7–2.4)

## 2022-10-20 MED ORDER — INSULIN DETEMIR 100 UNIT/ML ~~LOC~~ SOLN
15.0000 [IU] | Freq: Every day | SUBCUTANEOUS | Status: DC
  Administered 2022-10-20: 15 [IU] via SUBCUTANEOUS
  Filled 2022-10-20 (×2): qty 0.15

## 2022-10-20 MED ORDER — INSULIN DETEMIR 100 UNIT/ML ~~LOC~~ SOLN
10.0000 [IU] | Freq: Every day | SUBCUTANEOUS | Status: DC
  Filled 2022-10-20: qty 0.1

## 2022-10-20 MED ORDER — METOLAZONE 5 MG PO TABS
5.0000 mg | ORAL_TABLET | Freq: Once | ORAL | Status: AC
  Administered 2022-10-20: 5 mg via ORAL
  Filled 2022-10-20: qty 1

## 2022-10-20 MED ORDER — GLUCOSE 40 % PO GEL
2.0000 | ORAL | Status: AC
  Administered 2022-10-20: 62 g via ORAL
  Filled 2022-10-20: qty 2

## 2022-10-20 NOTE — Progress Notes (Signed)
Physical Therapy Treatment Patient Details Name: [name] MRN: [id number] DOB: 07-11-1941 Today's Date: 10/20/2022   History of Present Illness The pt is an 81 yo female presenting 11/9 with SOB and LE edema, found to have CHF exacerbation. Pt with recent admission for hip fx, d/c from inpatient rehab 10/26. PMH includes: arthritis, cardiomyopathy, CKD IV, CHF, COPD, CAD, DM II, HLD, HTN, and NSTEMI.    PT Comments    The pt was agreeable to session, able to demo good improvements in total ambulation distance, but remains limited in endurance at this time. She completed 56f+40ft +124f+1560fall with seated rest 3-5 min) with pt reporting SOB as limiting factor rather than pain. Will continue to benefit from skilled PT to progress functional strength and endurance, but is making good progress at this time.    Recommendations for follow up therapy are one component of a multi-disciplinary discharge planning process, led by the attending physician.  Recommendations may be updated based on patient status, additional functional criteria and insurance authorization.  Follow Up Recommendations  Home health PT     Assistance Recommended at Discharge Frequent or constant Supervision/Assistance  Patient can return home with the following A little help with walking and/or transfers;A little help with bathing/dressing/bathroom;Assistance with cooking/housework;Help with stairs or ramp for entrance   Equipment Recommendations  None recommended by PT    Recommendations for Other Services       Precautions / Restrictions Precautions Precautions: Fall Precaution Comments: monitor SpO2 to maintain >92%, hx of seizure disorder, no R LE ROM restrictions Restrictions Weight Bearing Restrictions: No RLE Weight Bearing: Weight bearing as tolerated     Mobility  Bed Mobility Overal bed mobility: Needs Assistance             General bed mobility comments: pt OOB in recliner at start and  end of session    Transfers Overall transfer level: Needs assistance Equipment used: Rolling walker (2 wheels) Transfers: Sit to/from Stand Sit to Stand: Min guard, Min assist           General transfer comment: minG from recliner and chair, minA from low toilet    Ambulation/Gait Ambulation/Gait assistance: Min guard Gait Distance (Feet): 60 Feet (+40 +10 +15 (all with seated rest 3-5 min)) Assistive device: Rolling walker (2 wheels) Gait Pattern/deviations: Step-to pattern, Step-through pattern, Decreased stance time - right, Decreased stride length, Knee flexed in stance - right Gait velocity: decreased Gait velocity interpretation: <1.31 ft/sec, indicative of household ambulator   General Gait Details: RLE in external rotation, less DF. pt reports this is baseline after hip fx      Balance Overall balance assessment: Needs assistance Sitting-balance support: No upper extremity supported, Feet supported Sitting balance-Leahy Scale: Fair     Standing balance support: Bilateral upper extremity supported, During functional activity Standing balance-Leahy Scale: Fair Standing balance comment: can static stand without UE support, BUE support for gait                            Cognition Arousal/Alertness: Awake/alert Behavior During Therapy: WFL for tasks assessed/performed Overall Cognitive Status: Within Functional Limits for tasks assessed                                             General Comments General comments (skin integrity, edema, etc.):  VSS on RA at rest, SpO2 95% on 2L, RR to 22. increased SOB and wheezing with exertion      Pertinent Vitals/Pain Pain Assessment Pain Assessment: Faces Faces Pain Scale: Hurts a little bit Pain Location: L heel, R hip Pain Descriptors / Indicators: Discomfort Pain Intervention(s): Monitored during session     PT Goals (current goals can now be found in the care plan section) Acute  Rehab PT Goals Patient Stated Goal: return home PT Goal Formulation: With patient Time For Goal Achievement: 10/31/22 Potential to Achieve Goals: Good Progress towards PT goals: Progressing toward goals    Frequency    Min 3X/week      PT Plan Current plan remains appropriate       AM-PAC PT "6 Clicks" Mobility   Outcome Measure  Help needed turning from your back to your side while in a flat bed without using bedrails?: A Little Help needed moving from lying on your back to sitting on the side of a flat bed without using bedrails?: A Little Help needed moving to and from a bed to a chair (including a wheelchair)?: A Little Help needed standing up from a chair using your arms (e.g., wheelchair or bedside chair)?: A Little Help needed to walk in hospital room?: A Little Help needed climbing 3-5 steps with a railing? : A Lot 6 Click Score: 17    End of Session Equipment Utilized During Treatment: Gait belt;Oxygen Activity Tolerance: Patient tolerated treatment well Patient left: in chair;with call bell/phone within reach Nurse Communication: Mobility status PT Visit Diagnosis: Unsteadiness on feet (R26.81);Muscle weakness (generalized) (M62.81)     Time: 1443-1540 PT Time Calculation (min) (ACUTE ONLY): 30 min  Charges:  $Therapeutic Exercise: 23-37 mins                     West Carbo, PT, DPT   Acute Rehabilitation Department   Sandra Cockayne 10/20/2022, 9:11 AM

## 2022-10-20 NOTE — Progress Notes (Signed)
   Heart Failure Stewardship Pharmacist Progress Note   PCP: Isaac Bliss, Rayford Halsted, MD PCP-Cardiologist: Mertie Moores, MD    HPI:  [name] is an 81 year old female with a PMH of CHF, NSTEMI s/p PCI to RCA, atrial flutter, AML, COPD, CKD, hypertension, hyperlipidemia and hepatic failure who presented with worsening edema and shortness of breath. She was recently discharged from rehab, reports symptom worsening despite adherence to diuretic regimen. In 08/2017, echocardiogram showed LVEF of 40-45% which has since improved to 60-65% on echocardiogram from 04/2022. Patient has previously tried Delene Loll, but it was stopped for 'itching.' AKI present on admission.  Current HF Medications: Diuretic: Lasix 80 mg IV BID Beta Blocker: metoprolol succinate 12.5 mg daily  Prior to admission HF Medications: Diuretic: torsemide 40 mg daily  Beta blocker: metoprolol succinate 12.5 mg daily ACE/ARB/ARNI: valsartan 80 mg daily SGLT2i: Jardiance 25 mg daily Other: Imdur 30 mg daily  Pertinent Lab Values: Serum creatinine 2.33, BUN 78, Potassium 4.1, Sodium 145, BNP 1198.5, Magnesium 1.7, A1c 9.9   Vital Signs: Weight: 169.3 lbs (admission weight: 172.2 lbs) Blood pressure: labile, average 130/40s  Heart rate: 80s  I/O: net -1.3L yesterday, -1.7L this admission  Medication Assistance / Insurance Benefits Check: Does the patient have prescription insurance?  Yes Type of insurance plan: Medicare  Does the patient qualify for medication assistance through manufacturers or grants?   Pending Eligible grants and/or patient assistance programs: pending Medication assistance applications in progress: pending  Medication assistance applications approved: pending Approved medication assistance renewals will be completed by: pending  Outpatient Pharmacy:  Prior to admission outpatient pharmacy: [facility] Is the patient willing to use [facility] at discharge? Yes Is the patient willing  to transition their outpatient pharmacy to utilize a [facility] outpatient pharmacy?   Pending    Assessment: 1. Acute on chronic diastolic HFimpEF (LVEF 57-32%) due to ICM. NYHA class III symptoms. - Continue strict I/Os along with daily weights. Maintain K >4 and Mg >2. - Scr remains elevated, but has improved on Lasix 80 mg BID. Remains markedly volume overloaded. Decent urine output with Lasix 80 mg BID. - BP is elevated, if SBP remains >130, then can consider hydralazine while AKI present.    Plan: 1) Medication changes recommended at this time: - Continue IV Lasix 80 mg BID, may need to increase to 120 mg tomorrow - Replace Mg with 2g IV   2) Patient assistance: - Pending  3)  Education  - Initial education done. To be completed prior to discharge  Thank you for allowing pharmacy to participate in this patient's care.  Reatha Harps, PharmD PGY2 Pharmacy Resident 10/20/2022 5:39 AM Check [email or web address] for unit specific pharmacy number

## 2022-10-20 NOTE — Progress Notes (Signed)
Rounding Note    Patient Name: [name] Date of Encounter: 10/20/2022  [place] Cardiologist: Mertie Moores, MD   Subjective   No acute overnight events. Patient is still having shortness of breath when ambulating around her room. Currently requiring 2L of O2 via nasal cannula (not on any at home). No chest pain.   Inpatient Medications    Scheduled Meds:  apixaban  2.5 mg Oral BID   atorvastatin  40 mg Oral Daily   Chlorhexidine Gluconate Cloth  6 each Topical Daily   clopidogrel  75 mg Oral QPM   docusate sodium  100 mg Oral BID   feeding supplement  237 mL Oral BID BM   furosemide  80 mg Intravenous BID   insulin aspart  0-15 Units Subcutaneous TID WC   insulin aspart  0-5 Units Subcutaneous QHS   insulin detemir  10 Units Subcutaneous Daily   letrozole  2.5 mg Oral Daily   levETIRAcetam  500 mg Oral BID   melatonin  3 mg Oral QHS   metolazone  5 mg Oral Once   metoprolol succinate  12.5 mg Oral Daily   polyethylene glycol  17 g Oral Daily   psyllium  1 packet Oral Daily   sodium bicarbonate  650 mg Oral BID   sodium chloride flush  3 mL Intravenous Q12H   Continuous Infusions:  PRN Meds: acetaminophen **OR** acetaminophen, bisacodyl, fluticasone furoate-vilanterol, hydrALAZINE, ipratropium-albuterol, LORazepam, methocarbamol, ondansetron **OR** ondansetron (ZOFRAN) IV, oxyCODONE, traZODone   Vital Signs    Vitals:   10/19/22 2000 10/20/22 0325 10/20/22 0332 10/20/22 0810  BP:   (!) 134/48 (!) 144/96  Pulse:   81 84  Resp: _0 Temp:   98 F (36.7 C) 97.8 F (36.6 C)  TempSrc:   Oral Oral  SpO2:    91%  Weight:  76.8 kg    Height:        Intake/Output Summary (Last 24 hours) at 10/20/2022 0946 Last data filed at 10/20/2022 2505 Gross per 24 hour  Intake 1414 ml  Output 2850 ml  Net -1436 ml      10/20/2022    3:25 AM 10/19/2022    3:44 AM 10/18/2022    6:28 AM  Last 3 Weights  Weight (lbs) 169 lb 5 oz 170 lb 13.7 oz  168 lb 14 oz  Weight (kg) 76.8 kg 77.5 kg 76.6 kg      Telemetry    Atrial fibrillation with rates in the 80s to 90s. - Personally Reviewed  ECG    No new ECG tracings today. - Personally Reviewed  Physical Exam   GEN: No acute distress.   Neck: Elevated JVD. Cardiac: Irregularly irregular rhythm with normal rate. No murmurs, rubs, or gallops.  Respiratory: No significant increased work of breathing. Decreased breath sounds in bilateral bases. Faint wheezes anteriorly.  GI: Soft, non-distended, and non-tender to palpation. MS: 3+ pitting edema of bilateral lower extremities. No deformity. Skin: Warm and skin. Neuro:  No focal deficits. Psych: Normal affect. Responds appropriately.  Labs    High Sensitivity Troponin:   Recent Labs  Lab 10/16/22 0342 10/16/22 0510  TROPONINIHS 10 12     Chemistry Recent Labs  Lab 10/16/22 0510 10/17/22 0315 10/18/22 0544 10/19/22 0530 10/20/22 0540  NA  --    < > 145 145 145  K  --    < > 4.5 4.5 4.1  CL  --    < >  115* 114* 111  CO2  --    < > 21* 21* 22  GLUCOSE  --    < > 113* 114* 175*  BUN  --    < > 79* 79* 78*  CREATININE  --    < > 2.79* 2.53* 2.33*  CALCIUM  --    < > 6.7* 6.7* 6.9*  MG  --   --   --   --  1.7  PROT 5.6*  --   --  5.5*  --   ALBUMIN 2.8*  --   --  3.0*  --   AST 19  --   --  13*  --   ALT 9  --   --  9  --   ALKPHOS 116  --   --  100  --   BILITOT 0.6  --   --  0.6  --   GFRNONAA  --    < > 17* 19* 21*  ANIONGAP  --    < > _0 < > = values in this interval not displayed.    Lipids No results for input(s): "CHOL", "TRIG", "HDL", "LABVLDL", "LDLCALC", "CHOLHDL" in the last 168 hours.  Hematology Recent Labs  Lab 10/16/22 0342 10/17/22 0315 10/19/22 0530  WBC 4.7 4.3 6.1  RBC 2.94* 2.91* 2.98*  HGB 9.2* 9.0* 9.4*  HCT 28.9* 28.1* 29.3*  MCV 98.3 96.6 98.3  MCH 31.3 30.9 31.5  MCHC 31.8 32.0 32.1  RDW 15.9* 15.7* 16.2*  PLT 136* 122* 115*   Thyroid No results for input(s): "TSH",  "FREET4" in the last 168 hours.  BNP Recent Labs  Lab 10/16/22 0342  BNP 1,198.5*    DDimer No results for input(s): "DDIMER" in the last 168 hours.   Radiology    No results found.  Cardiac Studies   Echocardiogram 10/16/2022: Impressions: 1. Left ventricular ejection fraction, by estimation, is 60 to 65%. The  left ventricle has normal function. The left ventricle has no regional  wall motion abnormalities. There is mild left ventricular hypertrophy.  Left ventricular diastolic parameters  are indeterminate.   2. Right ventricular systolic function is normal. The right ventricular  size is normal. There is mildly elevated pulmonary artery systolic  pressure.   3. Left atrial size was moderately dilated.   4. The mitral valve is degenerative. Mild mitral valve regurgitation. No  evidence of mitral stenosis. Severe mitral annular calcification.   5. Tricuspid valve regurgitation is moderate to severe.   6. The aortic valve is normal in structure. There is severe calcifcation  of the aortic valve. There is severe thickening of the aortic valve.  Aortic valve regurgitation is not visualized. Mild to moderate aortic  valve stenosis.   7. The inferior vena cava is normal in size with greater than 50%  respiratory variability, suggesting right atrial pressure of 3 mmHg.   Patient Profile     81 y.o. female with a history of CAD with NSTEMI s/p DES to RCA in 2018, chronic combined CHF with EF of 40-45% at time of NSTEMI but has since normalized, persistent atrial fibrillation on Eliquis, cirrhosis, hypertension, hyperlipidemia, type 2 diabetes mellitus, COPD, uterine cancer, breast cancer, and AML with who was admitted on 10/16/2022 for acute on chronic CHF and AKI after presenting with worsening lower extremity edema.  Assessment & Plan    Acute on Chronic Combined CHF with Recovered EF LVEF as low as 40-45% in 2018 at time  of NSTEMI but has since recovered. Patient presented with  worsening lower extremity edema. BNP elevated 1,198. Chest x-ray showed increased right pleural effusion (now moderate) with associated atelectasis and trace left pleural effusion. Echo showed LVEF of 60-65% with no regional wall motion abnormalities, normal RV, moderately dilated left atrium, severe MAC with mild MAC, severe aortic valve calcification with mild to moderate AS. She is being diuresed with IV Lasix. Documented urinary output of 2.65 L yesterday and net negative 1.9 L since admission. Weight down 3lbs since admission. Renal function slowly improving. - Still significantly volume overloaded. - Continue IV Lasix 22m twice daily. Also getting Metolazone 540mtoday. Agree with this. - Home Losartan and Jardiance currently on hold due to renal function. GFR currently <21. - Continue to monitor daily weights, strict I/Os, and renal function.  CAD History of NSTEMI in 2018 at which time cardiac catheterization showed multivessel CAD. Felt to be too high risk for CABG at that time so underwent successful PCI with DES to RCA. - No angina. - No aspirin due to DOAC. - Continue beta-blocker and statin.  Persistent Atrial Fibrillation Rate controlled. - Continue Toprol-XL 12.65m63maily. - Continue chronic anticoagulation with Eliquis 2.65mg53mice daily (reduced dose due to age and renal function).  Hypertension BP mildly elevated at times. - Continue Toprol-XL 12.65mg 70mly. - Continue diuresis as above. - Home Valsartan and Imdur currently on hold.    Hyperlipidemia - Continue Lipitor 40mg 60my.  Acute vs CKD Stage III Creatinine 3.26 on admission. Baseline around 1.8 to 2.0. Likely due to renal congestion.  - Slowly improving with diuresis. Creatinine 2.33 today. - Continue to monitor closely with diuresis.  Otherwise, per primary team: - Type 2 diabetes mellitus: hemoglobin A1c 9.9% - AML  - History of breast cancer and uterine cancer - Seizure disorder - Chronic pain  For  questions or updates, please contact [facility] consult [email or web address] for contact info under        Signed, CallieDarreld Mclean  10/20/2022, 9:46 AM

## 2022-10-20 NOTE — Consult Note (Signed)
   [facility] Inpatient Consult   10/20/2022  [name] July 20, 1941 [id number]  [place] [employer] [ACO] Patient:  [place] [employer]  Primary Care Provider:  Isaac Bliss, Rayford Halsted, MD, [place] at [facility]  Patient screened for 4 hospitalizations in the past 6 months with noted extreme high risk score for unplanned readmission risk. Reviewed to assess for potential [place] [facility] service needs for post hospital transition for care coordination.  Review of patient's electronic medical record reveals patient has had a long history with [facility] Care Management Met with patient at the bedside, sitting on the side of the bed. Introduced Armed forces training and education officer to patient and for post hospital needs.  Patient's main concern today is getting the fluid off and getting a "kit" to assist her with getting her TED hose on.  Reviewed for post hospital needs. Patient denies SDOH issues for transportation, food, or needs.  She endorses PCP and encouraged annual wellness visit due.  Gave patient an appointment reminder card with a 24 hour nurse advise line number.  Plan:  Spoke with inpatient [facility] RNCM and the kit is a purchase item from the gift shop and she is working with the patient on it. Continue to follow progress and disposition to assess for post hospital community care coordination/management needs.  Referral request for community care coordination:  Of note, [place] does not replace or interfere with any arrangements made by the Inpatient Transition of Care team.  For questions contact:   [name], RN BSN [place]  [phone] business mobile phone Toll free office [phone]  *[place]  [phone] Fax number: [phone] [email or web address]

## 2022-10-20 NOTE — Progress Notes (Signed)
Heart Failure Navigator Progress Note  Following this hospitalization to assess for HV TOC readiness.   EF 60-65 % HFpEF Continued diuresis x 4 days per MD  Earnestine Leys, BSN, RN Heart Failure Nurse Navigator Secure Chat Only

## 2022-10-20 NOTE — Plan of Care (Signed)
  Problem: Education: Goal: Ability to demonstrate management of disease process will improve Outcome: Progressing   Problem: Cardiac: Goal: Ability to achieve and maintain adequate cardiopulmonary perfusion will improve Outcome: Progressing   Problem: Education: Goal: Knowledge of General Education information will improve Description: Including pain rating scale, medication(s)/side effects and non-pharmacologic comfort measures Outcome: Progressing   Problem: Health Behavior/Discharge Planning: Goal: Ability to manage health-related needs will improve Outcome: Progressing   Problem: Clinical Measurements: Goal: Ability to maintain clinical measurements within normal limits will improve Outcome: Progressing

## 2022-10-20 NOTE — Progress Notes (Addendum)
PROGRESS NOTE    [name]  GYK:[id number] DOB: 29-Sep-1941 DOA: 10/16/2022 PCP: Isaac Bliss, Rayford Halsted, MD   65 /F w/ h/o of AML (remote); afib on Eliquis; chronic back pain; COPD; CAD; DM; HTN; HLD; chronic diastolic CHF; uterine/breast cancer; and CAD presenting with edema.  Recent hip fracture, dc on 10/26 from CIR. She was swollen and had trouble lifting her feet because they were too heavy.  Her legs feel like they weigh a ton and she is mostly in the wheelchair.  Her arms are also swollen and "will leak water out of them."  Some SOB  ER Course: BNP 1198, Creat 3.2, hb 9.2  Subjective: -Feels better overall, swelling starting to improve, still significantly tight and swollen  Assessment and Plan:  Acute on chronic diastolic CHF -Patient with known h/o chronic diastolic CHF presenting with worsening edema, anasarca, profound upper thigh swelling -Echo on 5/23 with preserved EF; repeat echo unchanged, RV function is normal -hypoalbuminemia contributing -Previously started on Entresto but this was stopped due to "itching" and changed to valsartan, Hold Jardiance -continue current dose of IV lasix '80mg'$  BID, urine output inaccurate, 2 L negative charted, repeat metolazone today -GDMT limited by AKI/CKD -Repeat chest x-ray with small right pleural effusion, does not need thoracentesis -Cards consulting, BMP in am -OOB, PT eval> Home health PT recommended   AKI on Stage 4 CKD -Likely cardiorenal, baseline creat 1.8-2, 3.2 on admission -Now improving, monitor with diuresis -Renal ultrasound without hydronephrosis, avoid hypotension -Hold Jardiance and valsartan  Asymptomatic bacteriuria -No symptoms of UTI, monitor off antibiotics   Persistent Afib -Rate controlled, continue Toprol -Continue Eliquis   H/o AML and recent breast cancer -Followed by Dr. [name], last seen 7/24 -She is in clinical remission and osseous changes on prior imaging are thought to be residual from  AML treatment  -She has a port-a-cath because she wanted to keep it, currently accessed -Continue letrozole for now   Seizure d/o -Continue Keppra   COPD -Uses Breo prn; will continue   HTN -Continue metoprolol, hold Imdur -Hold valsatran, Jardiance   HLD -Continue Lipitor   DM -Last A1c was 9.9, indicating poor control -Restart glargine -SSI   Chronic pain -Continue Ativan, Robaxin, Oxy per home regimen   CAD -s/p stent,  Too high risk for CABG in 2018 -Continue Imdur, Plavix    DVT prophylaxis: apixaban Code Status: DNR Family Communication: None present Disposition Plan: May need rehab again, will request PT OT eval  Consultants: Cards   Procedures:   Antimicrobials:    Objective: Vitals:   10/19/22 2000 10/20/22 0325 10/20/22 0332 10/20/22 0810  BP:   (!) 134/48 (!) 144/96  Pulse:   81 84  Resp: '20  20 19  '$ Temp:   98 F (36.7 C) 97.8 F (36.6 C)  TempSrc:   Oral Oral  SpO2:    91%  Weight:  76.8 kg    Height:        Intake/Output Summary (Last 24 hours) at 10/20/2022 0946 Last data filed at 10/20/2022 7939 Gross per 24 hour  Intake 1414 ml  Output 2850 ml  Net -1436 ml   Filed Weights   10/18/22 0628 10/19/22 0344 10/20/22 0325  Weight: 76.6 kg 77.5 kg 76.8 kg    Examination:  General exam: Obese pleasant female sitting up in bed, AAOx3, no distress HEENT: Positive JVD CVS: S1-S2, irregularly irregular rhythm Lungs: Decreased breath sounds the bases Abdomen: Soft, nontender, bowel sounds present Extremities: 2+  edema predominantly in upper thigh and flank Psychiatry:  Mood & affect appropriate.     Data Reviewed:   CBC: Recent Labs  Lab 10/16/22 0342 10/17/22 0315 10/19/22 0530  WBC 4.7 4.3 6.1  NEUTROABS 3.1  --   --   HGB 9.2* 9.0* 9.4*  HCT 28.9* 28.1* 29.3*  MCV 98.3 96.6 98.3  PLT 136* 122* 048*   Basic Metabolic Panel: Recent Labs  Lab 10/16/22 0342 10/17/22 0315 10/18/22 0544 10/19/22 0530 10/20/22 0540   NA 140 143 145 145 145  K 4.4 4.4 4.5 4.5 4.1  CL 111 116* 115* 114* 111  CO2 19* 19* 21* 21* 22  GLUCOSE 90 122* 113* 114* 175*  BUN 78* 79* 79* 79* 78*  CREATININE 3.26* 3.01* 2.79* 2.53* 2.33*  CALCIUM 6.9* 6.6* 6.7* 6.7* 6.9*  MG  --   --   --   --  1.7   GFR: Estimated Creatinine Clearance: 18.5 mL/min (A) (by C-G formula based on SCr of 2.33 mg/dL (H)). Liver Function Tests: Recent Labs  Lab 10/16/22 0510 10/19/22 0530  AST 19 13*  ALT 9 9  ALKPHOS 116 100  BILITOT 0.6 0.6  PROT 5.6* 5.5*  ALBUMIN 2.8* 3.0*   No results for input(s): "LIPASE", "AMYLASE" in the last 168 hours. No results for input(s): "AMMONIA" in the last 168 hours. Coagulation Profile: No results for input(s): "INR", "PROTIME" in the last 168 hours. Cardiac Enzymes: No results for input(s): "CKTOTAL", "CKMB", "CKMBINDEX", "TROPONINI" in the last 168 hours. BNP (last 3 results) No results for input(s): "PROBNP" in the last 8760 hours. HbA1C: No results for input(s): "HGBA1C" in the last 72 hours. CBG: Recent Labs  Lab 10/19/22 0614 10/19/22 1125 10/19/22 1553 10/19/22 2054 10/20/22 0654  GLUCAP 109* 213* 169* 242* 201*   Lipid Profile: No results for input(s): "CHOL", "HDL", "LDLCALC", "TRIG", "CHOLHDL", "LDLDIRECT" in the last 72 hours. Thyroid Function Tests: No results for input(s): "TSH", "T4TOTAL", "FREET4", "T3FREE", "THYROIDAB" in the last 72 hours. Anemia Panel: No results for input(s): "VITAMINB12", "FOLATE", "FERRITIN", "TIBC", "IRON", "RETICCTPCT" in the last 72 hours. Urine analysis:    Component Value Date/Time   COLORURINE YELLOW 09/18/2022 1302   APPEARANCEUR TURBID (A) 09/18/2022 1302   LABSPEC 1.008 09/18/2022 1302   LABSPEC 1.015 08/04/2014 1439   PHURINE 5.0 09/18/2022 1302   GLUCOSEU >=500 (A) 09/18/2022 1302   GLUCOSEU Negative 08/04/2014 1439   HGBUR MODERATE (A) 09/18/2022 1302   HGBUR negative 10/10/2010 1607   BILIRUBINUR NEGATIVE 09/18/2022 1302    BILIRUBINUR Negative 08/04/2014 1439   KETONESUR NEGATIVE 09/18/2022 1302   PROTEINUR NEGATIVE 09/18/2022 1302   UROBILINOGEN 0.2 08/04/2014 1439   NITRITE POSITIVE (A) 09/18/2022 1302   LEUKOCYTESUR LARGE (A) 09/18/2022 1302   LEUKOCYTESUR Negative 08/04/2014 1439   Sepsis Labs: '@LABRCNTIP'$ (procalcitonin:4,lacticidven:4)  )No results found for this or any previous visit (from the past 240 hour(s)).   Radiology Studies: No results found.   Scheduled Meds:  apixaban  2.5 mg Oral BID   atorvastatin  40 mg Oral Daily   Chlorhexidine Gluconate Cloth  6 each Topical Daily   clopidogrel  75 mg Oral QPM   docusate sodium  100 mg Oral BID   feeding supplement  237 mL Oral BID BM   furosemide  80 mg Intravenous BID   insulin aspart  0-15 Units Subcutaneous TID WC   insulin aspart  0-5 Units Subcutaneous QHS   insulin detemir  10 Units Subcutaneous Daily   letrozole  2.5 mg Oral Daily   levETIRAcetam  500 mg Oral BID   melatonin  3 mg Oral QHS   metolazone  5 mg Oral Once   metoprolol succinate  12.5 mg Oral Daily   polyethylene glycol  17 g Oral Daily   psyllium  1 packet Oral Daily   sodium bicarbonate  650 mg Oral BID   sodium chloride flush  3 mL Intravenous Q12H   Continuous Infusions:   LOS: 4 days    Time spent: 36mn    PDomenic Polite MD [facility]   10/20/2022, 9:46 AM

## 2022-10-20 NOTE — Progress Notes (Signed)
Hypoglycemic event, patient actually asymptomatic.  CBG 44, 47. Oral gel given- recheck is 70.

## 2022-10-20 NOTE — Progress Notes (Signed)
Occupational Therapy Treatment Patient Details Name: [name] MRN: [id number] DOB: 1941/09/15 Today's Date: 10/20/2022   History of present illness The pt is an 81 yo female presenting 11/9 with SOB and LE edema, found to have CHF exacerbation. Pt with recent admission for hip fx, d/c from inpatient rehab 10/26. PMH includes: arthritis, cardiomyopathy, CKD IV, CHF, COPD, CAD, DM II, HLD, HTN, and NSTEMI.   OT comments  [name] is making steady progress. Pt transfers OOB and ambulated to the sink with min G and RW. She tolerated standing for grooming tasks for ~2 minutes prior to needed sitting rest break. Grooming completed in sitting for energy conservation. Pt ambulated to bathroom fro toileting with min A to boost from lower toilet height. SOB and wheezing noted with exertion. OT to continue to follow, POC remains appropriate.    Recommendations for follow up therapy are one component of a multi-disciplinary discharge planning process, led by the attending physician.  Recommendations may be updated based on patient status, additional functional criteria and insurance authorization.    Follow Up Recommendations  Home health OT     Assistance Recommended at Discharge Set up Supervision/Assistance  Patient can return home with the following  A little help with walking and/or transfers;A little help with bathing/dressing/bathroom;Assistance with cooking/housework;Help with stairs or ramp for entrance;Assist for transportation   Equipment Recommendations  None recommended by OT       Precautions / Restrictions Precautions Precautions: Fall Precaution Comments: monitor SpO2 to maintain >92%, hx of seizure disorder, no R LE ROM restrictions Restrictions Weight Bearing Restrictions: No RLE Weight Bearing: Weight bearing as tolerated       Mobility Bed Mobility Overal bed mobility: Needs Assistance Bed Mobility: Supine to Sit, Sit to Supine     Supine to sit: Supervision Sit to  supine: Supervision        Transfers Overall transfer level: Needs assistance Equipment used: Rolling walker (2 wheels) Transfers: Sit to/from Stand Sit to Stand: Min assist           General transfer comment: min A from lower toilet height     Balance Overall balance assessment: Needs assistance Sitting-balance support: No upper extremity supported, Feet supported Sitting balance-Leahy Scale: Fair     Standing balance support: Bilateral upper extremity supported, During functional activity Standing balance-Leahy Scale: Fair                             ADL either performed or assessed with clinical judgement   ADL Overall ADL's : Needs assistance/impaired     Grooming: Set up;Sitting Grooming Details (indicate cue type and reason): initially standing at the sink, Geneticist, molecular Transfer: Minimal assistance Toilet Transfer Details (indicate cue type and reason): min A to stand from lower toilet height Toileting- Clothing Manipulation and Hygiene: Supervision/safety;Sitting/lateral lean       Functional mobility during ADLs: Minimal assistance General ADL Comments: stood at the sink for 2 minutes prior to needing a sitting break.    Extremity/Trunk Assessment Upper Extremity Assessment Upper Extremity Assessment: Generalized weakness   Lower Extremity Assessment Lower Extremity Assessment: Defer to PT evaluation        Vision   Vision Assessment?: No apparent visual deficits          Cognition Arousal/Alertness: Awake/alert Behavior During Therapy: WFL for tasks assessed/performed Overall Cognitive Status: Within  Functional Limits for tasks assessed                                 General Comments: limited self monitoring of symptoms noted              General Comments SOB and wheezing with exertion    Pertinent Vitals/ Pain       Pain Assessment Pain Assessment: Faces Faces Pain Scale: Hurts a  little bit Pain Location: L hip Pain Descriptors / Indicators: Discomfort Pain Intervention(s): Limited activity within patient's tolerance, Premedicated before session   Frequency  Min 2X/week        Progress Toward Goals  OT Goals(current goals can now be found in the care plan section)  Progress towards OT goals: Progressing toward goals  Acute Rehab OT Goals Patient Stated Goal: to feel better OT Goal Formulation: With patient Time For Goal Achievement: 10/31/22 Potential to Achieve Goals: Good ADL Goals Pt Will Perform Upper Body Bathing: with set-up Pt Will Perform Lower Body Bathing: Independently;sitting/lateral leans;sit to/from stand Pt Will Perform Upper Body Dressing: with set-up Pt Will Perform Lower Body Dressing: Independently;sit to/from stand;sitting/lateral leans Pt Will Transfer to Toilet: Independently Pt Will Perform Toileting - Clothing Manipulation and hygiene: Independently Pt Will Perform Tub/Shower Transfer: with min guard assist Additional ADL Goal #1: Patient will be able to verbalize 3 strategies for energy conservation and fall prevention for safe discharge home. Additional ADL Goal #2: Patient will be able to state 3 strategies for managing CHF levels to prevent further hospital admissions.  Plan Discharge plan remains appropriate       AM-PAC OT "6 Clicks" Daily Activity     Outcome Measure   Help from another person eating meals?: None Help from another person taking care of personal grooming?: A Little Help from another person toileting, which includes using toliet, bedpan, or urinal?: A Little Help from another person bathing (including washing, rinsing, drying)?: A Little Help from another person to put on and taking off regular upper body clothing?: None Help from another person to put on and taking off regular lower body clothing?: A Little 6 Click Score: 20    End of Session Equipment Utilized During Treatment: Rolling walker (2  wheels);Oxygen  OT Visit Diagnosis: Unsteadiness on feet (R26.81);Other abnormalities of gait and mobility (R26.89);Muscle weakness (generalized) (M62.81)   Activity Tolerance Patient tolerated treatment well   Patient Left in bed;with call bell/phone within reach;with bed alarm set   Nurse Communication Mobility status       Time: 6659-9357 OT Time Calculation (min): 23 min  Charges: OT General Charges $OT Visit: 1 Visit OT Treatments $Self Care/Home Management : 23-37 mins   Elliot Cousin 10/20/2022, 3:04 PM

## 2022-10-21 DIAGNOSIS — I5043 Acute on chronic combined systolic (congestive) and diastolic (congestive) heart failure: Secondary | ICD-10-CM

## 2022-10-21 DIAGNOSIS — I5033 Acute on chronic diastolic (congestive) heart failure: Secondary | ICD-10-CM | POA: Diagnosis not present

## 2022-10-21 DIAGNOSIS — N179 Acute kidney failure, unspecified: Secondary | ICD-10-CM | POA: Diagnosis not present

## 2022-10-21 LAB — BASIC METABOLIC PANEL
Anion gap: 11 (ref 5–15)
BUN: 78 mg/dL — ABNORMAL HIGH (ref 8–23)
CO2: 24 mmol/L (ref 22–32)
Calcium: 6.9 mg/dL — ABNORMAL LOW (ref 8.9–10.3)
Chloride: 111 mmol/L (ref 98–111)
Creatinine, Ser: 2.03 mg/dL — ABNORMAL HIGH (ref 0.44–1.00)
GFR, Estimated: 24 mL/min — ABNORMAL LOW (ref >60)
Glucose, Bld: 74 mg/dL (ref 70–99)
Potassium: 3.8 mmol/L (ref 3.5–5.1)
Sodium: 146 mmol/L — ABNORMAL HIGH (ref 135–145)

## 2022-10-21 LAB — GLUCOSE, CAPILLARY
Glucose-Capillary: 53 mg/dL — ABNORMAL LOW (ref 70–99)
Glucose-Capillary: 88 mg/dL (ref 70–99)
Glucose-Capillary: 136 mg/dL — ABNORMAL HIGH (ref 70–99)
Glucose-Capillary: 305 mg/dL — ABNORMAL HIGH (ref 70–99)
Glucose-Capillary: 111 mg/dL — ABNORMAL HIGH (ref 70–99)
Glucose-Capillary: 63 mg/dL — ABNORMAL LOW (ref 70–99)
Glucose-Capillary: 66 mg/dL — ABNORMAL LOW (ref 70–99)
Glucose-Capillary: 181 mg/dL — ABNORMAL HIGH (ref 70–99)

## 2022-10-21 LAB — MAGNESIUM: Magnesium: 1.7 mg/dL (ref 1.7–2.4)

## 2022-10-21 MED ORDER — MAGNESIUM SULFATE 2 GM/50ML IV SOLN
2.0000 g | Freq: Once | INTRAVENOUS | Status: AC
  Administered 2022-10-21: 2 g via INTRAVENOUS
  Filled 2022-10-21: qty 50

## 2022-10-21 MED ORDER — GLUCOSE 40 % PO GEL
ORAL | Status: AC
  Administered 2022-10-21: 31 g
  Filled 2022-10-21: qty 1

## 2022-10-21 MED ORDER — GLUCOSE 40 % PO GEL
2.0000 | ORAL | Status: AC
  Administered 2022-10-21: 62 g via ORAL
  Filled 2022-10-21: qty 2

## 2022-10-21 MED ORDER — INSULIN DETEMIR 100 UNIT/ML ~~LOC~~ SOLN
8.0000 [IU] | Freq: Every day | SUBCUTANEOUS | Status: DC
  Administered 2022-10-21 – 2022-10-29 (×9): 8 [IU] via SUBCUTANEOUS
  Filled 2022-10-21 (×9): qty 0.08

## 2022-10-21 NOTE — Plan of Care (Signed)
  Problem: Education: Goal: Ability to demonstrate management of disease process will improve Outcome: Progressing   Problem: Education: Goal: Ability to verbalize understanding of medication therapies will improve Outcome: Progressing   Problem: Activity: Goal: Capacity to carry out activities will improve Outcome: Progressing   Problem: Cardiac: Goal: Ability to achieve and maintain adequate cardiopulmonary perfusion will improve Outcome: Progressing   Problem: Education: Goal: Knowledge of General Education information will improve Description: Including pain rating scale, medication(s)/side effects and non-pharmacologic comfort measures Outcome: Progressing   Problem: Health Behavior/Discharge Planning: Goal: Ability to manage health-related needs will improve Outcome: Progressing

## 2022-10-21 NOTE — Inpatient Diabetes Management (Signed)
Inpatient Diabetes Program Recommendations  AACE/ADA: New Consensus Statement on Inpatient Glycemic Control (2015)  Target Ranges:  Prepandial:   less than 140 mg/dL      Peak postprandial:   less than 180 mg/dL (1-2 hours)      Critically ill patients:  140 - 180 mg/dL   Lab Results  Component Value Date   GLUCAP 136 (H) 10/21/2022   HGBA1C 9.9 (A) 08/13/2022    Review of Glycemic Control  Latest Reference Range & Units 10/20/22 06:54 10/20/22 11:15 10/20/22 15:44 10/20/22 21:27 10/20/22 21:30 10/20/22 21:57 10/21/22 06:13 10/21/22 06:44 10/21/22 08:04  Glucose-Capillary 70 - 99 mg/dL 201 (H)  Novolog 5 units 237 (H)  Novolog 5 units  Levemir 15 units 131 (H)  Novolog 2 units given late at 1819 44 (LL) 47 (L) 70 53 (L) 88 136 (H)   Diabetes history: DM 2 Outpatient Diabetes medications: Toujeo 16 units qhs, Novolog 10 units tid before meals (pt reports this as a sliding scale) Current orders for Inpatient glycemic control:  Novolog 0-15 units tid + hs Levemir 8 units Daily  Ensure Elive bid between meals A1c 9.9% on 9/6 at her Endocrinology appointment   Note Levemir reduced from 15 to 8 units. New dose of 8 units to be given today.  -   Reduce Novolog correction to 0-9 units tid + hs scale  Thanks, Tama Headings RN, MSN, BC-ADM Inpatient Diabetes Coordinator Team Pager [phone] (8a-5p)

## 2022-10-21 NOTE — Progress Notes (Signed)
Rounding Note    Patient Name: [name] Date of Encounter: 10/21/2022  [place] Cardiologist: Catherine Moores, MD   Subjective   Feeling well.  Her breathing is improving.    Inpatient Medications    Scheduled Meds:  apixaban  2.5 mg Oral BID   atorvastatin  40 mg Oral Daily   Chlorhexidine Gluconate Cloth  6 each Topical Daily   clopidogrel  75 mg Oral QPM   docusate sodium  100 mg Oral BID   feeding supplement  237 mL Oral BID BM   furosemide  80 mg Intravenous BID   insulin aspart  0-15 Units Subcutaneous TID WC   insulin aspart  0-5 Units Subcutaneous QHS   insulin detemir  8 Units Subcutaneous Daily   letrozole  2.5 mg Oral Daily   levETIRAcetam  500 mg Oral BID   melatonin  3 mg Oral QHS   metoprolol succinate  12.5 mg Oral Daily   polyethylene glycol  17 g Oral Daily   psyllium  1 packet Oral Daily   sodium chloride flush  3 mL Intravenous Q12H   Continuous Infusions:  PRN Meds: acetaminophen **OR** acetaminophen, bisacodyl, fluticasone furoate-vilanterol, hydrALAZINE, ipratropium-albuterol, LORazepam, methocarbamol, ondansetron **OR** ondansetron (ZOFRAN) IV, oxyCODONE, traZODone   Vital Signs    Vitals:   10/21/22 0309 10/21/22 0314 10/21/22 0500 10/21/22 0805  BP: (!) 99/41 (!) 111/41  (!) 158/71  Pulse: 73   79  Resp: (!) 21   17  Temp: (!) 97.4 F (36.3 C)   97.6 F (36.4 C)  TempSrc: Oral   Oral  SpO2: 92% 96%  95%  Weight:   74.9 kg   Height:        Intake/Output Summary (Last 24 hours) at 10/21/2022 0947 Last data filed at 10/21/2022 0807 Gross per 24 hour  Intake 480 ml  Output 1800 ml  Net -1320 ml      10/21/2022    5:00 AM 10/20/2022    3:25 AM 10/19/2022    3:44 AM  Last 3 Weights  Weight (lbs) 165 lb 2 oz 169 lb 5 oz 170 lb 13.7 oz  Weight (kg) 74.9 kg 76.8 kg 77.5 kg      Telemetry    Atrial fibrillation.  Rate less than 100 bpm.- Personally Reviewed  ECG    N/a - Personally Reviewed  Physical  Exam   VS:  BP (!) 158/71 (BP Location: Right Arm)   Pulse 79   Temp 97.6 F (36.4 C) (Oral)   Resp 17   Ht '5\' 2"'$  (1.575 m)   Wt 74.9 kg   SpO2 95%   BMI 30.20 kg/m  , BMI Body mass index is 30.2 kg/m. GENERAL: Chronically ill-appearing.  No acute distress. HEENT: Pupils equal round and reactive, fundi not visualized, oral mucosa unremarkable NECK:  + jugular venous distention, waveform within normal limits, carotid upstroke brisk and symmetric, no bruits, no thyromegaly LUNGS:  Clear to auscultation bilaterally HEART:  RRR.  PMI not displaced or sustained,S1 and S2 within normal limits, no S3, no S4, no clicks, no rubs, no murmurs ABD:  Flat, positive bowel sounds normal in frequency in pitch, no bruits, no rebound, no guarding, no midline pulsatile mass, no hepatomegaly, no splenomegaly EXT:  2 plus pulses throughout, 2+ LE edema to the knee bilaterally, no cyanosis no clubbing SKIN:  No rashes no nodules NEURO:  Cranial nerves II through XII grossly intact, motor grossly intact throughout PSYCH:  Cognitively  intact, oriented to person place and time   Labs    High Sensitivity Troponin:   Recent Labs  Lab 10/16/22 0342 10/16/22 0510  TROPONINIHS 10 12     Chemistry Recent Labs  Lab 10/16/22 0510 10/17/22 0315 10/19/22 0530 10/20/22 0540 10/21/22 0317 10/21/22 0500  NA  --    < > 145 145 146*  --   K  --    < > 4.5 4.1 3.8  --   CL  --    < > 114* 111 111  --   CO2  --    < > 21* 22 24  --   GLUCOSE  --    < > 114* 175* 74  --   BUN  --    < > 79* 78* 78*  --   CREATININE  --    < > 2.53* 2.33* 2.03*  --   CALCIUM  --    < > 6.7* 6.9* 6.9*  --   MG  --   --   --  1.7  --  1.7  PROT 5.6*  --  5.5*  --   --   --   ALBUMIN 2.8*  --  3.0*  --   --   --   AST 19  --  13*  --   --   --   ALT 9  --  9  --   --   --   ALKPHOS 116  --  100  --   --   --   BILITOT 0.6  --  0.6  --   --   --   GFRNONAA  --    < > 19* 21* 24*  --   ANIONGAP  --    < > '10 12 11  '$ --     < > = values in this interval not displayed.    Lipids No results for input(s): "CHOL", "TRIG", "HDL", "LABVLDL", "LDLCALC", "CHOLHDL" in the last 168 hours.  Hematology Recent Labs  Lab 10/16/22 0342 10/17/22 0315 10/19/22 0530  WBC 4.7 4.3 6.1  RBC 2.94* 2.91* 2.98*  HGB 9.2* 9.0* 9.4*  HCT 28.9* 28.1* 29.3*  MCV 98.3 96.6 98.3  MCH 31.3 30.9 31.5  MCHC 31.8 32.0 32.1  RDW 15.9* 15.7* 16.2*  PLT 136* 122* 115*   Thyroid No results for input(s): "TSH", "FREET4" in the last 168 hours.  BNP Recent Labs  Lab 10/16/22 0342  BNP 1,198.5*    DDimer No results for input(s): "DDIMER" in the last 168 hours.   Radiology    No results found.  Cardiac Studies   Echo 10/16/2022:   1. Left ventricular ejection fraction, by estimation, is 60 to 65%. The  left ventricle has normal function. The left ventricle has no regional  wall motion abnormalities. There is mild left ventricular hypertrophy.  Left ventricular diastolic parameters  are indeterminate.   2. Right ventricular systolic function is normal. The right ventricular  size is normal. There is mildly elevated pulmonary artery systolic  pressure.   3. Left atrial size was moderately dilated.   4. The mitral valve is degenerative. Mild mitral valve regurgitation. No  evidence of mitral stenosis. Severe mitral annular calcification.   5. Tricuspid valve regurgitation is moderate to severe.   6. The aortic valve is normal in structure. There is severe calcifcation  of the aortic valve. There is severe thickening of the aortic valve.  Aortic valve regurgitation is not visualized.  Mild to moderate aortic  valve stenosis.   7. The inferior vena cava is normal in size with greater than 50%  respiratory variability, suggesting right atrial pressure of 3 mmHg.   Patient Profile     [name] is an 29F with CAD s/p NSTEMI and RCA PCI, HFmrEF and recovered LV function, persistent atrial fibrillation, mild-moderate aortic  stenosis, hypertension, hyperlipidemia, DM, COPD, breast cancer, AML, and cirrhosis admitted with acute on chronic diastolic heart failure with anasarca and AKI.   Assessment & Plan    #Acute on chronic diastolic heart failure: # Hypertension; # AKI: LVEF was 40 to 45% in 2018 at the time of her NSTEMI.  It is since recovered and was 60 to 65% this admission.  She was admitted with anasarca and BNP 1198.  She also had pleural effusions.  Diuresis has been complicated by renal dysfunction.  Yesterday she was net -1 L and renal function continues to improve.  She did receive a dose of metolazone yesterday.  However, it was 2 hours after her Lasix dose.  Sodium today is 145.  We will hold off on additional metolazone today.  We will plan to give her a dose tomorrow.  We will also apply TED hose.  Home ARB and SGLT2 inhibitor are on hold in the setting of her renal dysfunction.  I suspect that low albumin is also contributing.  She still has a supplemental oxygen requirement which is not something she uses at home.  # CAD s/p RCA PCI:  NSTEMI in 2018.  She has multivessel disease but was no a candidate for CABG.  Continue with medical management (Plavix, atorvastatin and metoprolol).  She has no angina.     # Persistent atrial fibrillation:  She remains in atrial fibrillation.  Rates are controlled on metoprolol.  Continue Eliquis.       For questions or updates, please contact [place] Please consult [email or web address] for contact info under        Signed, Skeet Latch, MD  10/21/2022, 9:47 AM

## 2022-10-21 NOTE — Progress Notes (Signed)
Spoke with patients daughter, gave her an update. Will notify patient.

## 2022-10-21 NOTE — Progress Notes (Signed)
PROGRESS NOTE    [name]  EXB:[id number] DOB: 03-23-1941 DOA: 10/16/2022 PCP: Isaac Bliss, Rayford Halsted, MD   34 /F w/ h/o of AML (remote); afib on Eliquis; chronic back pain; COPD; CAD; DM; HTN; HLD; chronic diastolic CHF; uterine/breast cancer; and CAD presenting with edema.  Recent hip fracture, dc on 10/26 from CIR. She was swollen and had trouble lifting her feet because they were too heavy.  Her legs feel like they weigh a ton and she is mostly in the wheelchair.  Her arms are also swollen and "will leak water out of them."  Some SOB  ER Course: BNP 1198, Creat 3.2, hb 9.2 -Improving with diuresis  Subjective: -Continues to feel better, swelling improving, thighs not back to baseline yet  Assessment and Plan:  Acute on chronic diastolic CHF -Patient with known h/o chronic diastolic CHF presenting with worsening edema, anasarca, profound upper thigh swelling -Echo on 5/23 with preserved EF; repeat echo unchanged, RV function is normal -hypoalbuminemia contributing -Previously started on Entresto but this was stopped due to "itching" and changed to valsartan, Hold Jardiance -Continue Lasix 80 Mg twice daily, weight down 7 lbs, urine output inaccurate, at least 3.2 L negative -GDMT limited by AKI/CKD -Cards following, BMP in a.m. -OOB, PT eval> Home health PT recommended   AKI on Stage 4 CKD -Likely cardiorenal, baseline creat 1.8-2, 3.2 on admission -Now improving, monitor with diuresis -Renal ultrasound without hydronephrosis, avoid hypotension -Hold Jardiance and valsartan  Asymptomatic bacteriuria -No symptoms of UTI, monitor off antibiotics   Persistent Afib -Rate controlled, continue Toprol -Continue Eliquis   H/o AML and recent breast cancer -Followed by Dr. [name], last seen 7/24 -She is in clinical remission and osseous changes on prior imaging are thought to be residual from AML treatment  -She has a port-a-cath because she wanted to keep it, currently  accessed -Continue letrozole for now   Seizure d/o -Continue Keppra   COPD -Uses Breo prn; will continue   HTN -Continue metoprolol, hold Imdur -Hold valsatran, Jardiance   HLD -Continue Lipitor   DM -Last A1c was 9.9, indicating poor control -Hypoglycemic overnight, decreased insulin dose   Chronic pain -Continue Ativan, Robaxin, Oxy per home regimen   CAD -s/p stent,  Too high risk for CABG in 2018 -Continue Imdur, Plavix    DVT prophylaxis: apixaban Code Status: DNR Family Communication: None present Disposition Plan: Home with home health services, likely 48 hours  Consultants: Cards   Procedures:   Antimicrobials:    Objective: Vitals:   10/21/22 0500 10/21/22 0805 10/21/22 1109 10/21/22 1112  BP:  (!) 158/71 (!) 146/50   Pulse:  79 73   Resp:  17 19   Temp:  97.6 F (36.4 C) 97.6 F (36.4 C)   TempSrc:  Oral Oral   SpO2:  95% 90% 98%  Weight: 74.9 kg     Height:        Intake/Output Summary (Last 24 hours) at 10/21/2022 1150 Last data filed at 10/21/2022 1027 Gross per 24 hour  Intake 480 ml  Output 1800 ml  Net -1320 ml   Filed Weights   10/19/22 0344 10/20/22 0325 10/21/22 0500  Weight: 77.5 kg 76.8 kg 74.9 kg    Examination:  General exam: Obese pleasant female sitting up in bed, AAOx3, no distress HEENT: Positive JVD CVS: S1-S2, irregularly irregular rhythm Lungs: Decreased breath sounds to bases Abdomen: Soft, nontender, bowel sounds present Extremities: 1-2+ edema predominantly in upper thigh and flank Psychiatry:  Mood & affect appropriate.     Data Reviewed:   CBC: Recent Labs  Lab 10/16/22 0342 10/17/22 0315 10/19/22 0530  WBC 4.7 4.3 6.1  NEUTROABS 3.1  --   --   HGB 9.2* 9.0* 9.4*  HCT 28.9* 28.1* 29.3*  MCV 98.3 96.6 98.3  PLT 136* 122* 607*   Basic Metabolic Panel: Recent Labs  Lab 10/17/22 0315 10/18/22 0544 10/19/22 0530 10/20/22 0540 10/21/22 0317 10/21/22 0500  NA 143 145 145 145 146*  --    K 4.4 4.5 4.5 4.1 3.8  --   CL 116* 115* 114* 111 111  --   CO2 19* 21* 21* 22 24  --   GLUCOSE 122* 113* 114* 175* 74  --   BUN 79* 79* 79* 78* 78*  --   CREATININE 3.01* 2.79* 2.53* 2.33* 2.03*  --   CALCIUM 6.6* 6.7* 6.7* 6.9* 6.9*  --   MG  --   --   --  1.7  --  1.7   GFR: Estimated Creatinine Clearance: 20.9 mL/min (A) (by C-G formula based on SCr of 2.03 mg/dL (H)). Liver Function Tests: Recent Labs  Lab 10/16/22 0510 10/19/22 0530  AST 19 13*  ALT 9 9  ALKPHOS 116 100  BILITOT 0.6 0.6  PROT 5.6* 5.5*  ALBUMIN 2.8* 3.0*   No results for input(s): "LIPASE", "AMYLASE" in the last 168 hours. No results for input(s): "AMMONIA" in the last 168 hours. Coagulation Profile: No results for input(s): "INR", "PROTIME" in the last 168 hours. Cardiac Enzymes: No results for input(s): "CKTOTAL", "CKMB", "CKMBINDEX", "TROPONINI" in the last 168 hours. BNP (last 3 results) No results for input(s): "PROBNP" in the last 8760 hours. HbA1C: No results for input(s): "HGBA1C" in the last 72 hours. CBG: Recent Labs  Lab 10/20/22 2157 10/21/22 0613 10/21/22 0644 10/21/22 0804 10/21/22 1112  GLUCAP 70 53* 88 136* 305*   Lipid Profile: No results for input(s): "CHOL", "HDL", "LDLCALC", "TRIG", "CHOLHDL", "LDLDIRECT" in the last 72 hours. Thyroid Function Tests: No results for input(s): "TSH", "T4TOTAL", "FREET4", "T3FREE", "THYROIDAB" in the last 72 hours. Anemia Panel: No results for input(s): "VITAMINB12", "FOLATE", "FERRITIN", "TIBC", "IRON", "RETICCTPCT" in the last 72 hours. Urine analysis:    Component Value Date/Time   COLORURINE YELLOW 09/18/2022 1302   APPEARANCEUR TURBID (A) 09/18/2022 1302   LABSPEC 1.008 09/18/2022 1302   LABSPEC 1.015 08/04/2014 1439   PHURINE 5.0 09/18/2022 1302   GLUCOSEU >=500 (A) 09/18/2022 1302   GLUCOSEU Negative 08/04/2014 1439   HGBUR MODERATE (A) 09/18/2022 1302   HGBUR negative 10/10/2010 1607   BILIRUBINUR NEGATIVE 09/18/2022 1302    BILIRUBINUR Negative 08/04/2014 1439   KETONESUR NEGATIVE 09/18/2022 1302   PROTEINUR NEGATIVE 09/18/2022 1302   UROBILINOGEN 0.2 08/04/2014 1439   NITRITE POSITIVE (A) 09/18/2022 1302   LEUKOCYTESUR LARGE (A) 09/18/2022 1302   LEUKOCYTESUR Negative 08/04/2014 1439   Sepsis Labs: '@LABRCNTIP'$ (procalcitonin:4,lacticidven:4)  )No results found for this or any previous visit (from the past 240 hour(s)).   Radiology Studies: No results found.   Scheduled Meds:  apixaban  2.5 mg Oral BID   atorvastatin  40 mg Oral Daily   Chlorhexidine Gluconate Cloth  6 each Topical Daily   clopidogrel  75 mg Oral QPM   docusate sodium  100 mg Oral BID   feeding supplement  237 mL Oral BID BM   furosemide  80 mg Intravenous BID   insulin aspart  0-15 Units Subcutaneous TID WC   insulin  aspart  0-5 Units Subcutaneous QHS   insulin detemir  8 Units Subcutaneous Daily   letrozole  2.5 mg Oral Daily   levETIRAcetam  500 mg Oral BID   melatonin  3 mg Oral QHS   metoprolol succinate  12.5 mg Oral Daily   polyethylene glycol  17 g Oral Daily   psyllium  1 packet Oral Daily   sodium chloride flush  3 mL Intravenous Q12H   Continuous Infusions:   LOS: 5 days    Time spent: 85mn    PDomenic Polite MD [facility]   10/21/2022, 11:50 AM

## 2022-10-21 NOTE — Progress Notes (Signed)
Hypoglycemic Event  CBG: 66  Treatment: 1 tube glucose gel, orange juice  Symptoms: None  Follow-up CBG: Time 0300,9233  CBG Result:111  Possible Reasons for Event: Unknown  Comments/MD notified:Joseph MD notified.    Julianne Handler

## 2022-10-21 NOTE — Progress Notes (Signed)
   10/21/22 1205  Mobility  Activity Ambulated with assistance in hallway  Level of Assistance Contact guard assist, steadying assist  Assistive Device Front wheel walker  Distance Ambulated (ft) 40 ft  Activity Response Tolerated well  Mobility Referral Yes  $Mobility charge 1 Mobility   Mobility Specialist Progress Note  Pt was in bed and agreeable. Mobility cut short d/t c/o leg pain. Returned to bed w/ all needs met and NT in room.   Lucious Groves Mobility Specialist

## 2022-10-21 NOTE — Progress Notes (Signed)
CBG 53, oral glucose given again and recheck= 88.

## 2022-10-21 NOTE — Progress Notes (Signed)
   Heart Failure Stewardship Pharmacist Progress Note   PCP: Isaac Bliss, Rayford Halsted, MD PCP-Cardiologist: Mertie Moores, MD    HPI:  [name] is an 81 year old female with a PMH of CHF, NSTEMI s/p PCI to RCA, atrial flutter, AML, COPD, CKD, hypertension, hyperlipidemia and hepatic failure who presented with worsening edema and shortness of breath. She was recently discharged from rehab, reports symptom worsening despite adherence to diuretic regimen. In 08/2017, echocardiogram showed LVEF of 40-45% which has since improved to 60-65% on echocardiogram from 04/2022. Patient has previously tried Delene Loll, but it was stopped for 'itching.' AKI present on admission.  Peripheral edema is improved but still present. She reports continued orthopnea. Urine output is likely not accurate given weight continues to trend down significantly.  Current HF Medications: Diuretic: Lasix 80 mg IV BID Beta Blocker: metoprolol succinate 12.5 mg daily  Prior to admission HF Medications: Diuretic: torsemide 40 mg daily  Beta blocker: metoprolol succinate 12.5 mg daily ACE/ARB/ARNI: valsartan 80 mg daily SGLT2i: Jardiance 25 mg daily Other: Imdur 30 mg daily  Pertinent Lab Values: Serum creatinine 2.03, BUN 78, Potassium 3.8, Sodium 146, BNP 1198.5, Magnesium 1.7, A1c 9.9   Vital Signs: Weight: 165.12 lbs (admission weight: 172.2 lbs) Blood pressure: labile, average 110/40s  Heart rate: 60-70s  I/O: net -0.6L yesterday, -2.8L this admission  Medication Assistance / Insurance Benefits Check: Does the patient have prescription insurance?  Yes Type of insurance plan: Medicare  Does the patient qualify for medication assistance through manufacturers or grants?   Pending Eligible grants and/or patient assistance programs: pending Medication assistance applications in progress: pending  Medication assistance applications approved: pending Approved medication assistance renewals will be completed by:  pending  Outpatient Pharmacy:  Prior to admission outpatient pharmacy: [facility] Is the patient willing to use [facility] at discharge? Yes Is the patient willing to transition their outpatient pharmacy to utilize a [facility] outpatient pharmacy?   Pending    Assessment: 1. Acute on chronic diastolic HFimpEF (LVEF 16-07%) due to ICM. NYHA class III symptoms. - Continue strict I/Os along with daily weights. Maintain K >4 and Mg >2. - Scr remains elevated, but has improved with diuresis. Remains markedly volume overloaded, urine output has decreased, however, weight has significantly down. Na continues to trend up. Continuing metolazone with IV diuresis will help mitigate hypernatremia in addition to increasing urine output. - BP is declining from SBP of 130-150 yesterday to 111 this AM, will continue to monitor  Plan: 1) Medication changes recommended at this time: - Give metolazone 5 mg x 1  - Give Mg 2g IV x1  2) Patient assistance: - Pending  3)  Education  - Initial education done. To be completed prior to discharge  Thank you for allowing pharmacy to participate in this patient's care.  Reatha Harps, PharmD PGY2 Pharmacy Resident 10/21/2022 7:12 AM Check [email or web address] for unit specific pharmacy number

## 2022-10-22 ENCOUNTER — Other Ambulatory Visit: Payer: Medicare HMO

## 2022-10-22 ENCOUNTER — Ambulatory Visit: Payer: Medicare HMO | Admitting: Nurse Practitioner

## 2022-10-22 DIAGNOSIS — I4819 Other persistent atrial fibrillation: Secondary | ICD-10-CM

## 2022-10-22 DIAGNOSIS — N179 Acute kidney failure, unspecified: Secondary | ICD-10-CM

## 2022-10-22 LAB — CBC
HCT: 26.1 % — ABNORMAL LOW (ref 36.0–46.0)
Hemoglobin: 8.6 g/dL — ABNORMAL LOW (ref 12.0–15.0)
MCH: 31.6 pg (ref 26.0–34.0)
MCHC: 33 g/dL (ref 30.0–36.0)
MCV: 96 fL (ref 80.0–100.0)
Platelets: 93 10*3/uL — ABNORMAL LOW (ref 150–400)
RBC: 2.72 MIL/uL — ABNORMAL LOW (ref 3.87–5.11)
RDW: 15.7 % — ABNORMAL HIGH (ref 11.5–15.5)
WBC: 4.2 10*3/uL (ref 4.0–10.5)
nRBC: 0 % (ref 0.0–0.2)

## 2022-10-22 LAB — GLUCOSE, CAPILLARY
Glucose-Capillary: 124 mg/dL — ABNORMAL HIGH (ref 70–99)
Glucose-Capillary: 192 mg/dL — ABNORMAL HIGH (ref 70–99)
Glucose-Capillary: 244 mg/dL — ABNORMAL HIGH (ref 70–99)
Glucose-Capillary: 167 mg/dL — ABNORMAL HIGH (ref 70–99)

## 2022-10-22 LAB — BASIC METABOLIC PANEL
Anion gap: 8 (ref 5–15)
BUN: 72 mg/dL — ABNORMAL HIGH (ref 8–23)
CO2: 25 mmol/L (ref 22–32)
Calcium: 6.8 mg/dL — ABNORMAL LOW (ref 8.9–10.3)
Chloride: 109 mmol/L (ref 98–111)
Creatinine, Ser: 1.93 mg/dL — ABNORMAL HIGH (ref 0.44–1.00)
GFR, Estimated: 26 mL/min — ABNORMAL LOW (ref >60)
Glucose, Bld: 125 mg/dL — ABNORMAL HIGH (ref 70–99)
Potassium: 3.7 mmol/L (ref 3.5–5.1)
Sodium: 142 mmol/L (ref 135–145)

## 2022-10-22 LAB — MAGNESIUM: Magnesium: 2.1 mg/dL (ref 1.7–2.4)

## 2022-10-22 MED ORDER — INSULIN ASPART 100 UNIT/ML IJ SOLN
0.0000 [IU] | Freq: Three times a day (TID) | INTRAMUSCULAR | Status: DC
  Administered 2022-10-22: 3 [IU] via SUBCUTANEOUS
  Administered 2022-10-22: 2 [IU] via SUBCUTANEOUS
  Administered 2022-10-23: 5 [IU] via SUBCUTANEOUS
  Administered 2022-10-23: 2 [IU] via SUBCUTANEOUS
  Administered 2022-10-24 – 2022-10-25 (×3): 3 [IU] via SUBCUTANEOUS
  Administered 2022-10-25: 5 [IU] via SUBCUTANEOUS
  Administered 2022-10-25: 1 [IU] via SUBCUTANEOUS
  Administered 2022-10-26: 7 [IU] via SUBCUTANEOUS
  Administered 2022-10-26: 3 [IU] via SUBCUTANEOUS
  Administered 2022-10-27: 2 [IU] via SUBCUTANEOUS
  Administered 2022-10-27: 5 [IU] via SUBCUTANEOUS
  Administered 2022-10-27 – 2022-10-28 (×2): 3 [IU] via SUBCUTANEOUS
  Administered 2022-10-28: 1 [IU] via SUBCUTANEOUS
  Administered 2022-10-28: 3 [IU] via SUBCUTANEOUS
  Administered 2022-10-29: 7 [IU] via SUBCUTANEOUS

## 2022-10-22 NOTE — Progress Notes (Signed)
   10/22/22 1100  Mobility  Activity Ambulated with assistance in hallway  Level of Assistance Standby assist, set-up cues, supervision of patient - no hands on  Assistive Device Four wheel walker  Distance Ambulated (ft) 280 ft  Activity Response Tolerated well  Mobility Referral Yes  $Mobility charge 1 Mobility   Mobility Specialist Progress Note  Pt was in bed and agreeable. Had no c/o pain during ambulation. Returned to bed w/ all needs met and call bell in reach.  Lucious Groves Mobility Specialist

## 2022-10-22 NOTE — Progress Notes (Addendum)
PROGRESS NOTE    [name]  WEX:[id number] DOB: June 21, 1941 DOA: 10/16/2022 PCP: Isaac Bliss, Rayford Halsted, MD   24 /F w/ h/o of AML (remote); afib on Eliquis; chronic back pain; COPD; CAD; DM; HTN; HLD; chronic diastolic CHF; uterine/breast cancer; and CAD presenting with edema.  Recent hip fracture, dc on 10/26 from CIR. She was swollen and had trouble lifting her feet because they were too heavy.  Her legs feel like they weigh a ton and she is mostly in the wheelchair.  Her arms are also swollen and "will leak water out of them."  Some SOB  ER Course: BNP 1198, Creat 3.2, hb 9.2 -Improving with diuresis  Subjective: -Continues to feel better, thigh swelling is starting to improve, able to walk a bit more  Assessment and Plan:  Acute on chronic diastolic CHF -Patient with known h/o chronic diastolic CHF presenting with worsening edema, anasarca, profound upper thigh swelling -Echo on 5/23 with preserved EF; repeat echo unchanged, RV function is normal -hypoalbuminemia contributing -Previously started on Entresto but this was stopped due to "itching" and changed to valsartan, Hold Jardiance -Continue Lasix 80 Mg twice daily, weight down 12 lbs, urine output inaccurate, 4.9 liters negative recorded -GDMT limited by AKI/CKD -Cards following, BMP in a.m. -OOB, PT eval> Home health PT recommended   AKI on Stage 4 CKD -Likely cardiorenal, baseline creat 1.8-2, 3.2 on admission -Now improving, monitor with diuresis -Renal ultrasound without hydronephrosis, avoid hypotension -Hold Jardiance and valsartan  Asymptomatic bacteriuria -No symptoms of UTI, monitor off antibiotics   Persistent Afib -Rate controlled, continue Toprol -Continue Eliquis   H/o AML and recent breast cancer -Followed by Dr. [name], last seen 7/24 -She is in clinical remission and osseous changes on prior imaging are thought to be residual from AML treatment  -She has a port-a-cath because she wanted to  keep it, currently accessed -Continue letrozole for now   Seizure d/o -Continue Keppra   COPD -Uses Breo prn; will continue   HTN -Continue metoprolol, hold Imdur -Hold valsatran, Jardiance   HLD -Continue Lipitor   DM -Last A1c was 9.9, indicating poor control -Hypoglycemia noted, decreased insulin dose   Chronic pain -Continue Ativan, Robaxin, Oxy per home regimen   CAD -s/p stent,  Too high risk for CABG in 2018 -Continue Imdur, Plavix  Chronic thrombocytopenia -With mild worsening, will trend   DVT prophylaxis: apixaban Code Status: DNR Family Communication: None present Disposition Plan: Home with home health services, likely 48 hours  Consultants: Cards   Procedures:   Antimicrobials:    Objective: Vitals:   10/21/22 1635 10/21/22 1926 10/22/22 0342 10/22/22 0343  BP: (!) 141/56 (!) 136/57 (!) 145/57   Pulse: 66 75    Resp: 20 20 (!) 22   Temp: 97.7 F (36.5 C) 98.1 F (36.7 C) 98.1 F (36.7 C)   TempSrc: Oral Oral Oral   SpO2: 96% 95% 96%   Weight:    72.9 kg  Height:        Intake/Output Summary (Last 24 hours) at 10/22/2022 1021 Last data filed at 10/22/2022 0825 Gross per 24 hour  Intake 600 ml  Output 2250 ml  Net -1650 ml   Filed Weights   10/20/22 0325 10/21/22 0500 10/22/22 0343  Weight: 76.8 kg 74.9 kg 72.9 kg    Examination:  Obese pleasant female sitting up in bed, AAOx3, no distress HEENT: Positive JVD CVS: S1-S2, irregularly irregular rhythm Lungs: Decreased breath sounds to bases Abdomen: Soft, nontender,  bowel sounds present Extremities: 2+ edema, predominantly in both upper thighs Psychiatry:  Mood & affect appropriate.   Data Reviewed:   CBC: Recent Labs  Lab 10/16/22 0342 10/17/22 0315 10/19/22 0530 10/22/22 0354  WBC 4.7 4.3 6.1 4.2  NEUTROABS 3.1  --   --   --   HGB 9.2* 9.0* 9.4* 8.6*  HCT 28.9* 28.1* 29.3* 26.1*  MCV 98.3 96.6 98.3 96.0  PLT 136* 122* 115* 93*   Basic Metabolic Panel: Recent  Labs  Lab 10/18/22 0544 10/19/22 0530 10/20/22 0540 10/21/22 0317 10/21/22 0500 10/22/22 0354  NA 145 145 145 146*  --  142  K 4.5 4.5 4.1 3.8  --  3.7  CL 115* 114* 111 111  --  109  CO2 21* 21* 22 24  --  25  GLUCOSE 113* 114* 175* 74  --  125*  BUN 79* 79* 78* 78*  --  72*  CREATININE 2.79* 2.53* 2.33* 2.03*  --  1.93*  CALCIUM 6.7* 6.7* 6.9* 6.9*  --  6.8*  MG  --   --  1.7  --  1.7 2.1   GFR: Estimated Creatinine Clearance: 21.7 mL/min (A) (by C-G formula based on SCr of 1.93 mg/dL (H)). Liver Function Tests: Recent Labs  Lab 10/16/22 0510 10/19/22 0530  AST 19 13*  ALT 9 9  ALKPHOS 116 100  BILITOT 0.6 0.6  PROT 5.6* 5.5*  ALBUMIN 2.8* 3.0*   No results for input(s): "LIPASE", "AMYLASE" in the last 168 hours. No results for input(s): "AMMONIA" in the last 168 hours. Coagulation Profile: No results for input(s): "INR", "PROTIME" in the last 168 hours. Cardiac Enzymes: No results for input(s): "CKTOTAL", "CKMB", "CKMBINDEX", "TROPONINI" in the last 168 hours. BNP (last 3 results) No results for input(s): "PROBNP" in the last 8760 hours. HbA1C: No results for input(s): "HGBA1C" in the last 72 hours. CBG: Recent Labs  Lab 10/21/22 1638 10/21/22 1649 10/21/22 1729 10/21/22 2120 10/22/22 0606  GLUCAP 66* 63* 111* 181* 124*   Lipid Profile: No results for input(s): "CHOL", "HDL", "LDLCALC", "TRIG", "CHOLHDL", "LDLDIRECT" in the last 72 hours. Thyroid Function Tests: No results for input(s): "TSH", "T4TOTAL", "FREET4", "T3FREE", "THYROIDAB" in the last 72 hours. Anemia Panel: No results for input(s): "VITAMINB12", "FOLATE", "FERRITIN", "TIBC", "IRON", "RETICCTPCT" in the last 72 hours. Urine analysis:    Component Value Date/Time   COLORURINE YELLOW 09/18/2022 1302   APPEARANCEUR TURBID (A) 09/18/2022 1302   LABSPEC 1.008 09/18/2022 1302   LABSPEC 1.015 08/04/2014 1439   PHURINE 5.0 09/18/2022 1302   GLUCOSEU >=500 (A) 09/18/2022 1302   GLUCOSEU  Negative 08/04/2014 1439   HGBUR MODERATE (A) 09/18/2022 1302   HGBUR negative 10/10/2010 1607   BILIRUBINUR NEGATIVE 09/18/2022 1302   BILIRUBINUR Negative 08/04/2014 1439   KETONESUR NEGATIVE 09/18/2022 1302   PROTEINUR NEGATIVE 09/18/2022 1302   UROBILINOGEN 0.2 08/04/2014 1439   NITRITE POSITIVE (A) 09/18/2022 1302   LEUKOCYTESUR LARGE (A) 09/18/2022 1302   LEUKOCYTESUR Negative 08/04/2014 1439   Sepsis Labs: '@LABRCNTIP'$ (procalcitonin:4,lacticidven:4)  )No results found for this or any previous visit (from the past 240 hour(s)).   Radiology Studies: No results found.   Scheduled Meds:  apixaban  2.5 mg Oral BID   atorvastatin  40 mg Oral Daily   Chlorhexidine Gluconate Cloth  6 each Topical Daily   clopidogrel  75 mg Oral QPM   docusate sodium  100 mg Oral BID   feeding supplement  237 mL Oral BID BM  furosemide  80 mg Intravenous BID   insulin aspart  0-9 Units Subcutaneous TID WC   insulin detemir  8 Units Subcutaneous Daily   letrozole  2.5 mg Oral Daily   levETIRAcetam  500 mg Oral BID   melatonin  3 mg Oral QHS   metoprolol succinate  12.5 mg Oral Daily   polyethylene glycol  17 g Oral Daily   psyllium  1 packet Oral Daily   sodium chloride flush  3 mL Intravenous Q12H   Continuous Infusions:   LOS: 6 days    Time spent: 29mn    PDomenic Polite MD [facility]   10/22/2022, 10:21 AM

## 2022-10-22 NOTE — Progress Notes (Addendum)
Rounding Note    Patient Name: [name] Date of Encounter: 10/22/2022  [place] Cardiologist: Mertie Moores, MD   Subjective   Patient states she is feeling overall much improved, she noted her swelling of legs and thighs are much improved.  She denied SOB, chest pain.   Inpatient Medications    Scheduled Meds:  apixaban  2.5 mg Oral BID   atorvastatin  40 mg Oral Daily   Chlorhexidine Gluconate Cloth  6 each Topical Daily   clopidogrel  75 mg Oral QPM   docusate sodium  100 mg Oral BID   feeding supplement  237 mL Oral BID BM   furosemide  80 mg Intravenous BID   insulin aspart  0-15 Units Subcutaneous TID WC   insulin aspart  0-5 Units Subcutaneous QHS   insulin detemir  8 Units Subcutaneous Daily   letrozole  2.5 mg Oral Daily   levETIRAcetam  500 mg Oral BID   melatonin  3 mg Oral QHS   metoprolol succinate  12.5 mg Oral Daily   polyethylene glycol  17 g Oral Daily   psyllium  1 packet Oral Daily   sodium chloride flush  3 mL Intravenous Q12H   Continuous Infusions:  PRN Meds: acetaminophen **OR** acetaminophen, bisacodyl, fluticasone furoate-vilanterol, hydrALAZINE, ipratropium-albuterol, LORazepam, methocarbamol, ondansetron **OR** ondansetron (ZOFRAN) IV, oxyCODONE, traZODone   Vital Signs    Vitals:   10/21/22 1635 10/21/22 1926 10/22/22 0342 10/22/22 0343  BP: (!) 141/56 (!) 136/57 (!) 145/57   Pulse: 66 75    Resp: 20 20 (!) 22   Temp: 97.7 F (36.5 C) 98.1 F (36.7 C) 98.1 F (36.7 C)   TempSrc: Oral Oral Oral   SpO2: 96% 95% 96%   Weight:    72.9 kg  Height:        Intake/Output Summary (Last 24 hours) at 10/22/2022 0916 Last data filed at 10/22/2022 0825 Gross per 24 hour  Intake 600 ml  Output 2250 ml  Net -1650 ml      10/22/2022    3:43 AM 10/21/2022    5:00 AM 10/20/2022    3:25 AM  Last 3 Weights  Weight (lbs) 160 lb 12.8 oz 165 lb 2 oz 169 lb 5 oz  Weight (kg) 72.938 kg 74.9 kg 76.8 kg      Telemetry     A fib with ventricular rate of  70s - Personally Reviewed  ECG    N/A today - Personally Reviewed  Physical Exam   GEN: No acute distress.  Frail elderly  Neck: No JVD Cardiac: Irregularly irregular, no murmurs, rubs, or gallops.  Respiratory: Clear to auscultation bilaterally. On room air GI: Soft, nontender MS: BLE TEDs on, 1+ BLE edema up to thighs  Neuro:  Nonfocal  Psych: Normal affect   Labs    High Sensitivity Troponin:   Recent Labs  Lab 10/16/22 0342 10/16/22 0510  TROPONINIHS 10 12     Chemistry Recent Labs  Lab 10/16/22 0510 10/17/22 0315 10/19/22 0530 10/20/22 0540 10/21/22 0317 10/21/22 0500 10/22/22 0354  NA  --    < > 145 145 146*  --  142  K  --    < > 4.5 4.1 3.8  --  3.7  CL  --    < > 114* 111 111  --  109  CO2  --    < > 21* 22 24  --  25  GLUCOSE  --    < >  114* 175* 74  --  125*  BUN  --    < > 79* 78* 78*  --  72*  CREATININE  --    < > 2.53* 2.33* 2.03*  --  1.93*  CALCIUM  --    < > 6.7* 6.9* 6.9*  --  6.8*  MG  --   --   --  1.7  --  1.7 2.1  PROT 5.6*  --  5.5*  --   --   --   --   ALBUMIN 2.8*  --  3.0*  --   --   --   --   AST 19  --  13*  --   --   --   --   ALT 9  --  9  --   --   --   --   ALKPHOS 116  --  100  --   --   --   --   BILITOT 0.6  --  0.6  --   --   --   --   GFRNONAA  --    < > 19* 21* 24*  --  26*  ANIONGAP  --    < > '10 12 11  '$ --  8   < > = values in this interval not displayed.    Lipids No results for input(s): "CHOL", "TRIG", "HDL", "LABVLDL", "LDLCALC", "CHOLHDL" in the last 168 hours.  Hematology Recent Labs  Lab 10/17/22 0315 10/19/22 0530 10/22/22 0354  WBC 4.3 6.1 4.2  RBC 2.91* 2.98* 2.72*  HGB 9.0* 9.4* 8.6*  HCT 28.1* 29.3* 26.1*  MCV 96.6 98.3 96.0  MCH 30.9 31.5 31.6  MCHC 32.0 32.1 33.0  RDW 15.7* 16.2* 15.7*  PLT 122* 115* 93*   Thyroid No results for input(s): "TSH", "FREET4" in the last 168 hours.  BNP Recent Labs  Lab 10/16/22 0342  BNP 1,198.5*    DDimer No results for  input(s): "DDIMER" in the last 168 hours.   Radiology    No results found.  Cardiac Studies   Echo from 10/16/22:   1. Left ventricular ejection fraction, by estimation, is 60 to 65%. The  left ventricle has normal function. The left ventricle has no regional  wall motion abnormalities. There is mild left ventricular hypertrophy.  Left ventricular diastolic parameters  are indeterminate.   2. Right ventricular systolic function is normal. The right ventricular  size is normal. There is mildly elevated pulmonary artery systolic  pressure.   3. Left atrial size was moderately dilated.   4. The mitral valve is degenerative. Mild mitral valve regurgitation. No  evidence of mitral stenosis. Severe mitral annular calcification.   5. Tricuspid valve regurgitation is moderate to severe.   6. The aortic valve is normal in structure. There is severe calcifcation  of the aortic valve. There is severe thickening of the aortic valve.  Aortic valve regurgitation is not visualized. Mild to moderate aortic  valve stenosis. Aortic valve mean gradient measures 12.0 mmHg. Aortic valve peak gradient  measures 21.3 mmHg. Aortic valve area, by VTI measures 1.01 cm.   7. The inferior vena cava is normal in size with greater than 50%  respiratory variability, suggesting right atrial pressure of 3 mmHg.    Patient Profile     81 y.o. female with complex PMH of CAD s/p PCI-RCA (not a CABG candidate), ischemic cardiomyopathy with recovered EF, chronic diastolic heart failure, HTN, HLD, type 2 DM, atrial flutter/fibrillation,  COPD, AML in remission, left breast cancer s/p lumpectomy on letrozole, meningioma, CKD IV, seizure disorder, recent hip fracture with discharge to CIR on 10/02/22,  cardiology is consulted and following for CHF exacerbation.   Assessment & Plan    Acute on chronic diastolic heart failure  Anasarca  - presented with anasarca - BNP 1198, POA - LVEF 40-45% in 2018 in the setting of  NSTEMI, has recovered, LVEF 60-65% this admission - ReDS obtained today 32%, appreciate pharmacy team, likely close to euvolemia  - diuresis has been limiting due to AKI, Cr peaked at 3.26 on 10/16/22, downtrended to 1.93, baseline appears around 1.7-1.9, likely CRS, Net -4.9L, weight is down from 172 to 160 ibs since admission, suspect hypoalbuminemia and prolonged bed rest also contributing to leg edema, would continue IV Lasix '80mg'$  BID for another 24 hours, not metolazone was given 11/12 and 11/13, may repeat id needed  - GDMT: PTA valsartan and jardiance held since admission due to AKI, GFR <30, would avoid ACEI/ARB/ARNI/MRA and SGLT2I at this time  Multivessel CAD with hx of PCI to RCA 2018 Hx of ischemic cardiomyopathy   - Hs trop negative x2 - deemed not a candidate for CABG in 2018  - no agnina  - continue medical therapy with plavix, metoprolol, lipitor   Persistent atrial fibrillation - rate controlled, continue PTA metoprolol and eliquis   AKI on CKD IV Normocytic anemia  COPD AML in remission Left breast cancer on hormonal therapy  Seizure disorder  Type 2 DM Chronic pain  Debility  - per primary team    For questions or updates, please contact [place] Please consult [email or web address] for contact info under        Signed, Margie Billet, NP  10/22/2022, 9:16 AM

## 2022-10-22 NOTE — Plan of Care (Signed)
°  Problem: Education: °Goal: Ability to demonstrate management of disease process will improve °Outcome: Progressing °  °Problem: Cardiac: °Goal: Ability to achieve and maintain adequate cardiopulmonary perfusion will improve °Outcome: Progressing °  °Problem: Education: °Goal: Knowledge of General Education information will improve °Description: Including pain rating scale, medication(s)/side effects and non-pharmacologic comfort measures °Outcome: Progressing °  °

## 2022-10-22 NOTE — Progress Notes (Addendum)
REDS Clip  READING = 32% (20-35% = normal >35% = volume overload) Station A, 31 inches

## 2022-10-22 NOTE — Progress Notes (Signed)
Occupational Therapy Treatment Patient Details Name: [name] MRN: [id number] DOB: [birth year] Today's Date: 10/22/2022   History of present illness The pt is an 81 yo female presenting 11/9 with SOB and LE edema, found to have CHF exacerbation. Pt with recent admission for hip fx, d/c from inpatient rehab 10/26. PMH includes: arthritis, cardiomyopathy, CKD IV, CHF, COPD, CAD, DM II, HLD, HTN, and NSTEMI.   OT comments  [name] is making steady progress, she was eating EOB upon arrival on 1L. Removed [place] with O2 88-92% throughout session. Returned pt on 1L at the end of the session. Overall pt is superivsion level for most transfers and mobility with RW, however continues to need min A to stand from lower toilet height. She remains limited by activity tolerance and needs sitting rest breaks after ~2-3 minutes of standing task. OT to continue to follow acutely. POC remains appropriate.    Recommendations for follow up therapy are one component of a multi-disciplinary discharge planning process, led by the attending physician.  Recommendations may be updated based on patient status, additional functional criteria and insurance authorization.    Follow Up Recommendations  Home health OT     Assistance Recommended at Discharge Set up Supervision/Assistance  Patient can return home with the following  A little help with walking and/or transfers;A little help with bathing/dressing/bathroom;Assistance with cooking/housework;Help with stairs or ramp for entrance;Assist for transportation   Equipment Recommendations  None recommended by OT       Precautions / Restrictions Precautions Precautions: Fall Precaution Comments: monitor SpO2 to maintain >92%, hx of seizure disorder, no R LE ROM restrictions Restrictions Weight Bearing Restrictions: No RLE Weight Bearing: Weight bearing as tolerated       Mobility Bed Mobility               General bed mobility comments: EOB upon arrival     Transfers Overall transfer level: Needs assistance Equipment used: Rolling walker (2 wheels) Transfers: Sit to/from Stand Sit to Stand: Min assist           General transfer comment: min A from lower toilet height     Balance Overall balance assessment: Needs assistance Sitting-balance support: No upper extremity supported, Feet supported Sitting balance-Leahy Scale: Fair     Standing balance support: Single extremity supported, During functional activity Standing balance-Leahy Scale: Fair           ADL either performed or assessed with clinical judgement   ADL Overall ADL's : Needs assistance/impaired Eating/Feeding: Independent;Sitting Eating/Feeding Details (indicate cue type and reason): eating EOB upon arrival         Toilet Transfer: Minimal assistance;Regular Toilet;Grab bars Toilet Transfer Details (indicate cue type and reason): min A to rise from lower toilet height Toileting- Clothing Manipulation and Hygiene: Modified independent;Sitting/lateral lean       Functional mobility during ADLs: Minimal assistance;Rolling walker (2 wheels) General ADL Comments: continues to be limited by activity tolerance and increase in SOB    Extremity/Trunk Assessment Upper Extremity Assessment Upper Extremity Assessment: Generalized weakness   Lower Extremity Assessment Lower Extremity Assessment: Defer to PT evaluation        Vision   Vision Assessment?: No apparent visual deficits          Cognition Arousal/Alertness: Awake/alert Behavior During Therapy: WFL for tasks assessed/performed Overall Cognitive Status: Within Functional Limits for tasks assessed  General Comments: limited self monitoring of symptoms noted              General Comments SpO2 88091% on RA with activity. Retunrd 1L [place] at the end of the session    Pertinent Vitals/ Pain       Pain Assessment Pain Assessment:  Faces   Frequency  Min 2X/week        Progress Toward Goals  OT Goals(current goals can now be found in the care plan section)  Progress towards OT goals: Progressing toward goals  Acute Rehab OT Goals Patient Stated Goal: to go home OT Goal Formulation: With patient Time For Goal Achievement: 10/31/22 Potential to Achieve Goals: Good ADL Goals Pt Will Perform Upper Body Bathing: with set-up Pt Will Perform Lower Body Bathing: Independently;sitting/lateral leans;sit to/from stand Pt Will Perform Upper Body Dressing: with set-up Pt Will Perform Lower Body Dressing: Independently;sit to/from stand;sitting/lateral leans Pt Will Transfer to Toilet: Independently Pt Will Perform Toileting - Clothing Manipulation and hygiene: Independently Pt Will Perform Tub/Shower Transfer: with min guard assist Additional ADL Goal #1: Patient will be able to verbalize 3 strategies for energy conservation and fall prevention for safe discharge home. Additional ADL Goal #2: Patient will be able to state 3 strategies for managing CHF levels to prevent further hospital admissions.  Plan Discharge plan remains appropriate       AM-PAC OT "6 Clicks" Daily Activity     Outcome Measure   Help from another person eating meals?: None Help from another person taking care of personal grooming?: A Little Help from another person toileting, which includes using toliet, bedpan, or urinal?: A Little Help from another person bathing (including washing, rinsing, drying)?: A Little Help from another person to put on and taking off regular upper body clothing?: None Help from another person to put on and taking off regular lower body clothing?: A Little 6 Click Score: 20    End of Session Equipment Utilized During Treatment: Rolling walker (2 wheels)  OT Visit Diagnosis: Unsteadiness on feet (R26.81);Other abnormalities of gait and mobility (R26.89);Muscle weakness (generalized) (M62.81)   Activity  Tolerance Patient tolerated treatment well   Patient Left in chair;with call bell/phone within reach   Nurse Communication Mobility status        Time: 4481-8563 OT Time Calculation (min): 19 min  Charges: OT General Charges $OT Visit: 1 Visit OT Treatments $Self Care/Home Management : 8-22 mins    Elliot Cousin 10/22/2022, 12:24 PM

## 2022-10-22 NOTE — Telephone Encounter (Signed)
Called and spoke with daughter about initial call that was unreturned. She states patient is now back in the hospital and has been for 1 week. Says that they are diuresing her there and they will follow-up once discharged. Of note, pt currently has appt scheduled with Christen Bame on  11/28/22.

## 2022-10-22 NOTE — Progress Notes (Signed)
Heart Failure Nurse Navigator Progress Note  PCP: Isaac Bliss, Rayford Halsted, MD PCP-Cardiologist: Nah[place] Admission Diagnosis: Acute on chronic combined systolic and diastolic heart failure, Acute kidney injury.  Admitted from: Home  Presentation:   [name] presented with worsening 2+ bilateral lower extremity edema, up thru abdomen. Shortness of breath, decrease urine output.  Was in hospital for a hip fracture in September, went to rehab , was admitted for fluid overload. Patient reported she has been taking all her medications and watching her fluids/ diet. She uses a walker for now after her hip surgery and doesn't move around a lot, and hasn't been weighing herself regularly. BP 129/52, HR 68, BNP 1.198. EKG shows Atrial fibrillation. IV lasix given,   Patient was seen in the HF TOC in May 2023 for fluid overload. Now readmitted . Patient was educated again on the sign and symptoms of heart failure, daily weights, when to call her doctor or go to the ED. Diet/ fluid restrictions, taking all her medications as prescribed, and attending all her doctor appointments. Patient verbalized her understanding, she is scheduled for a HF TOC follow up appointment on 11/04/2022 @ 2 pm.   ECHO/ LVEF: 60-65 % HFrEF  Clinical Course:  Past Medical History:  Diagnosis Date   Acute myelogenous leukemia ([place]) 02/2014   Acute pancreatitis    Arthritis    "back" (09/17/2017)   Atrial flutter (HCC)    Cardiomyopathy (HCC)    CHF (congestive heart failure) (HCC)    Chronic kidney disease, stage II (mild)    Chronic lower back pain    Colon polyps 04/29/2010   TUBULAR ADENOMA AND A SERRATED ADENOMA   COPD (chronic obstructive pulmonary disease) ([place])    CORONARY ARTERY DISEASE 12/24/2007   DIABETES MELLITUS, TYPE II 07/13/2007   History of blood transfusion 2015   "related to leukemia"   History of kidney stones 12/24/2007   History of uterine cancer    HYPERLIPIDEMIA 12/24/2007    HYPERTENSION 07/13/2007   NSTEMI (non-ST elevated myocardial infarction) ([place]) 09/17/2017   Uterine cancer ([place])      Social History   Socioeconomic History   Marital status: Married    Spouse name: Marshawn Ninneman   Number of children: 1   Years of education: Not on file   Highest education level: 9th grade  Occupational History   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  Tobacco Use   Smoking status: Former    Packs/day: 1.00    Years: 20.00    Total pack years: 20.00    Types: Cigarettes    Quit date: 12/08/2000    Years since quitting: 21.8   Smokeless tobacco: Never  Vaping Use   Vaping Use: Never used  Substance and Sexual Activity   Alcohol use: No    Alcohol/week: 0.0 standard drinks of alcohol   Drug use: No   Sexual activity: Never  Other Topics Concern   Not on file  Social History Narrative   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'$    Social Determinants of Health   Financial Resource Strain: Low Risk  (10/22/2022)   Overall Financial Resource Strain (CARDIA)    Difficulty of Paying Living Expenses: Not very hard  Food Insecurity: No Food Insecurity (10/22/2022)   Hunger Vital Sign    Worried About Running Out of Food in the Last Year: Never true    Ran Out of Food in the Last Year: Never  true  Transportation Needs: No Transportation Needs (10/22/2022)   PRAPARE - Hydrologist (Medical): No    Lack of Transportation (Non-Medical): No  Physical Activity: Not on file  Stress: No Stress Concern Present (05/12/2022)   [place]    Feeling of Stress : Not at all  Social Connections: Not on file   Education Assessment and Provision:  Detailed education and instructions provided on heart failure disease management including the following:  Signs and symptoms of Heart Failure When to call the physician Importance of daily  weights Low sodium diet Fluid restriction Medication management Anticipated future follow-up appointments  Patient education given on each of the above topics.  Patient acknowledges understanding via teach back method and acceptance of all instructions.  Education Materials:  "Living Better With Heart Failure" Booklet, HF zone tool, & Daily Weight Tracker Tool.  Patient has scale at home: Yes Patient has pill box at home: Yes    High Risk Criteria for Readmission and/or Poor Patient Outcomes: Heart failure hospital admissions (last 6 months): 2  No Show rate: 6% Difficult social situation: No Demonstrates medication adherence: Yes Primary Language: English Literacy level: Reading, writing, and comprehension.   Barriers of Care:   Daily weight Cont. Diet/ fluid restrictions  Considerations/Referrals:   Referral made to Heart Failure Pharmacist Stewardship: Yes Referral made to Heart Failure CSW/NCM TOC: No Referral made to Heart & Vascular TOC clinic: Yes, 11/04/22 @ 2 pm   Items for Follow-up on DC/TOC: Daily weights Continued Diet/ fluid restrictions   Earnestine Leys, BSN, RN Heart Failure Transport planner Only

## 2022-10-22 NOTE — Progress Notes (Signed)
Nutrition Follow-up  DOCUMENTATION CODES:   Not applicable  INTERVENTION:  - Heart Healthy/Carb Modified diet with 1584m fluid restriction and 2g Na restriction per MD.      - Discontinue Ensure Enlive supplements as patient eating very well and endorses good appetite.  NUTRITION DIAGNOSIS:   Increased nutrient needs related to chronic illness (COPD, CHF, CKD) as evidenced by estimated needs. *remains applicable  GOAL:   Patient will meet greater than or equal to 90% of their needs *being met  MONITOR:   PO intake, Weight trends, I & O's  REASON FOR ASSESSMENT:   Consult Diet education (Heart Failure diet education)  ASSESSMENT:   81y.o. female with PMH AML (remission), COPD, CKD, CAD, DMII, HTN, HLD, CHF who presented with edema. Noted to have recent hip fx.  Patient reports eating well over the past few days. Having 3 meals a day and endorses good appetite. Documented to be consuming 50-100% of needs. Consuming Ensure when provided.  Weight noted to be decreased since admission however patient admitted with edema and on lasix so suspect changes are a result of fluid loss.  % Intake of Meals: 11/11: 75% dinner 11/12: 50% breakfast, 100% lunch, 100% dinner 11/13: 100% breakfast, 100% dinner 11/15 100% breakfast   RD provided "Heart Failure Nutrition Therapy" handout from the Academy of Nutrition and Dietetics. Provided examples on ways to decrease sodium intake in diet. Discouraged intake of processed foods and use of salt shaker. Encouraged fresh fruits and vegetables as well as whole grain sources of carbohydrates to maximize fiber intake.  RD discussed why it is important for patient to adhere to diet recommendations, and emphasized the role of fluids, foods to avoid, and importance of weighing self daily. Teach back method used. Expect fair compliance.   Medications reviewed and include: Colace, Miralax, Lasix  Labs reviewed:  Creatinine 1.93 HA1C 9.9 Blood  glucose 63-305 x24 hours   NUTRITION - FOCUSED PHYSICAL EXAM:  Flowsheet Row Most Recent Value  Orbital Region Mild depletion  Upper Arm Region Mild depletion  Thoracic and Lumbar Region No depletion  Buccal Region Mild depletion  Temple Region Mild depletion  Clavicle Bone Region Mild depletion  Clavicle and Acromion Bone Region Mild depletion  Scapular Bone Region Unable to assess  Dorsal Hand Mild depletion  Patellar Region No depletion  Anterior Thigh Region No depletion  Posterior Calf Region No depletion  Edema (RD Assessment) Moderate  Hair Reviewed  Eyes Reviewed  Mouth Reviewed  Skin Reviewed  Nails Reviewed       Diet Order:   Diet Order             Diet heart healthy/carb modified Room service appropriate? Yes; Fluid consistency: Thin; Fluid restriction: 1500 mL Fluid  Diet effective now                   EDUCATION NEEDS:   Education needs have been addressed  Skin:  Skin Assessment: Reviewed RN Assessment  Last BM:  11/14  Height:  Ht Readings from Last 1 Encounters:  10/16/22 _0  (1.575 m)   Weight:  Wt Readings from Last 1 Encounters:  10/22/22 72.9 kg    Ideal Body Weight:  50 kg  BMI:  Body mass index is 29.41 kg/m.  Estimated Nutritional Needs:  Kcal:  1700-1900 Protein:  80-100 grams Fluid:  >/= 1.7L    ASamson FredericRD, LDN For contact information, refer to [facility]

## 2022-10-22 NOTE — Progress Notes (Signed)
   10/22/22 1440  Mobility  Activity Ambulated with assistance to bathroom  Level of Assistance Standby assist, set-up cues, supervision of patient - no hands on  Assistive Device Front wheel walker  Distance Ambulated (ft) 10 ft  Activity Response Tolerated well  Mobility Referral Yes   Mobility Specialist Progress Note  Pt requesting to go to the BR. Had no c/o pain throughout ambulation. Returned to bed w/ all needs met and call bell in reach.   Lucious Groves Mobility Specialist

## 2022-10-22 NOTE — Progress Notes (Signed)
Physical Therapy Treatment Patient Details Name: [name] MRN: [id number] DOB: August 27, 1941 Today's Date: 10/22/2022   History of Present Illness The pt is an 81 yo female presenting 11/9 with SOB and LE edema, found to have CHF exacerbation. Pt with recent admission for hip fx, d/c from inpatient rehab 10/26. PMH includes: arthritis, cardiomyopathy, CKD IV, CHF, COPD, CAD, DM II, HLD, HTN, and NSTEMI.    PT Comments    Pt is making good, steady progress with mobility, ambulating an increased distance of up to ~130 ft with a rollator and min guard assist today. She continues to fatigue fairly quickly, needing a several minute duration seated rest break afterwards with SpO2 noted to drop as low as 85% on 2L O2 (<1 min of seated rest break to recover to 94%). She also demonstrated fatigue after x5 rep sit to/from stand using bil upper extremities to assist in powering up to stand. Will continue to follow acutely. Current recommendations remain appropriate.   Recommendations for follow up therapy are one component of a multi-disciplinary discharge planning process, led by the attending physician.  Recommendations may be updated based on patient status, additional functional criteria and insurance authorization.  Follow Up Recommendations  Home health PT     Assistance Recommended at Discharge Frequent or constant Supervision/Assistance  Patient can return home with the following A little help with walking and/or transfers;A little help with bathing/dressing/bathroom;Assistance with cooking/housework;Help with stairs or ramp for entrance   Equipment Recommendations  None recommended by PT    Recommendations for Other Services       Precautions / Restrictions Precautions Precautions: Fall Precaution Comments: monitor SpO2 to maintain >92%, hx of seizure disorder, no R LE ROM restrictions Restrictions Weight Bearing Restrictions: No RLE Weight Bearing: Weight bearing as tolerated      Mobility  Bed Mobility Overal bed mobility: Needs Assistance Bed Mobility: Supine to Sit     Supine to sit: Supervision, HOB elevated     General bed mobility comments: Supervision for safety    Transfers Overall transfer level: Needs assistance Equipment used: Rollator (4 wheels) Transfers: Sit to/from Stand Sit to Stand: Min guard, Min assist           General transfer comment: minG from recliner 6x and EOB 1x, minA from low toilet 1x    Ambulation/Gait Ambulation/Gait assistance: Min guard Gait Distance (Feet): 130 Feet (x2 bouts of ~15 ft > ~130 ft) Assistive device: Rollator (4 wheels) Gait Pattern/deviations: Step-through pattern, Decreased stance time - right, Decreased stride length, Knee flexed in stance - right, Trunk flexed Gait velocity: decreased Gait velocity interpretation: <1.31 ft/sec, indicative of household ambulator   General Gait Details: RLE in external rotation, less DF. Per note 10/20/22, pt reports this is baseline after hip fx. Pt with slow, small steps and flexed posture. Min guard assist for safety, no LOB but mild instability noted   Stairs             Wheelchair Mobility    Modified Rankin (Stroke Patients Only)       Balance Overall balance assessment: Needs assistance Sitting-balance support: No upper extremity supported, Feet supported Sitting balance-Leahy Scale: Fair     Standing balance support: Bilateral upper extremity supported, During functional activity, No upper extremity supported Standing balance-Leahy Scale: Fair Standing balance comment: can stand to comb hair at sink without UE support, Bil UE support for gait  Cognition Arousal/Alertness: Awake/alert Behavior During Therapy: WFL for tasks assessed/performed Overall Cognitive Status: Within Functional Limits for tasks assessed                                          Exercises Other  Exercises Other Exercises: sit <> stand 5x from recliner, using UEs    General Comments General comments (skin integrity, edema, etc.): SpO2 down to 85% on 2L with gait, needing <1 min to recover to 94% on 2L with sitting rest      Pertinent Vitals/Pain Pain Assessment Pain Assessment: 0-10 Pain Score: 7  Pain Location: bil legs Pain Descriptors / Indicators: Discomfort, Spasm Pain Intervention(s): Limited activity within patient's tolerance, Monitored during session, Premedicated before session, Repositioned    Home Living                          Prior Function            PT Goals (current goals can now be found in the care plan section) Acute Rehab PT Goals Patient Stated Goal: return home PT Goal Formulation: With patient Time For Goal Achievement: 10/31/22 Potential to Achieve Goals: Good Progress towards PT goals: Progressing toward goals    Frequency    Min 3X/week      PT Plan Current plan remains appropriate    Co-evaluation              AM-PAC PT "6 Clicks" Mobility   Outcome Measure  Help needed turning from your back to your side while in a flat bed without using bedrails?: A Little Help needed moving from lying on your back to sitting on the side of a flat bed without using bedrails?: A Little Help needed moving to and from a bed to a chair (including a wheelchair)?: A Little Help needed standing up from a chair using your arms (e.g., wheelchair or bedside chair)?: A Little Help needed to walk in hospital room?: A Little Help needed climbing 3-5 steps with a railing? : A Lot 6 Click Score: 17    End of Session Equipment Utilized During Treatment: Oxygen Activity Tolerance: Patient tolerated treatment well Patient left: in chair;with call bell/phone within reach;Other (comment) (pt verbalized she would not get up alone)   PT Visit Diagnosis: Unsteadiness on feet (R26.81);Muscle weakness (generalized) (M62.81);Other abnormalities of  gait and mobility (R26.89)     Time: 3546-5681 PT Time Calculation (min) (ACUTE ONLY): 28 min  Charges:  $Gait Training: 8-22 mins $Therapeutic Exercise: 8-22 mins                     Moishe Spice, PT, DPT Acute Rehabilitation Services  Office: [place] 10/22/2022, 5:17 PM

## 2022-10-22 NOTE — Progress Notes (Addendum)
   Heart Failure Stewardship Pharmacist Progress Note   PCP: Isaac Bliss, Rayford Halsted, MD PCP-Cardiologist: Mertie Moores, MD    HPI:  [name] is an 81 year old female with a PMH of CHF, NSTEMI s/p PCI to RCA, atrial flutter, AML, COPD, CKD, hypertension, hyperlipidemia and hepatic failure who presented with worsening edema and shortness of breath. She was recently discharged from rehab, reports symptom worsening despite adherence to diuretic regimen. In 08/2017, echocardiogram showed LVEF of 40-45% which has since improved to 60-65% on echocardiogram from 04/2022. Patient has previously tried Delene Loll, but it was stopped for 'itching.' AKI present on admission.  Peripheral edema is improved but still present. Urine output is likely not accurate given weight continues to trend down significantly.  Current HF Medications: Diuretic: Lasix 80 mg IV BID Beta Blocker: metoprolol succinate 12.5 mg daily  Prior to admission HF Medications: Diuretic: torsemide 40 mg daily  Beta blocker: metoprolol succinate 12.5 mg daily ACE/ARB/ARNI: valsartan 80 mg daily SGLT2i: Jardiance 25 mg daily Other: Imdur 30 mg daily  Pertinent Lab Values: Serum creatinine 1.93, BUN 72, Potassium 3.7, Sodium 142, BNP 1198.5, Magnesium 2.1, A1c 9.9   Vital Signs: Weight: 160.8 lbs (admission weight: 172.2 lbs) Blood pressure: labile, average 130-140/50s  Heart rate: 60-70s  I/O: net -2.1L yesterday, -4.7L this admission  Medication Assistance / Insurance Benefits Check: Does the patient have prescription insurance?  Yes Type of insurance plan: Medicare  Does the patient qualify for medication assistance through manufacturers or grants?   Pending Eligible grants and/or patient assistance programs: pending Medication assistance applications in progress: pending  Medication assistance applications approved: pending Approved medication assistance renewals will be completed by: pending  Outpatient  Pharmacy:  Prior to admission outpatient pharmacy: [facility] Is the patient willing to use [facility] at discharge? Yes Is the patient willing to transition their outpatient pharmacy to utilize a [facility] outpatient pharmacy?   Pending    Assessment: 1. Acute on chronic diastolic HFimpEF (LVEF 05-39%) due to ICM. NYHA class III symptoms. - Continue strict I/Os along with daily weights. Maintain K >4 and Mg >2. Weight is discordant with I/Os - Scr remains elevated, but has improved with diuresis. REDS reading showing no lung edema, but patient reports peripheral edema still present. Na improved from yesterday. Can transition to PO diuretics tomorrow. - BP remains elevated with SBP in 140s. Can consider afterload reduction with hydralazine if needed. Plan: 1) Medication changes recommended at this time: - Continue IV diuresis today x 1 more day - Give Kcl 40 meq once, then 20 meq at noon for replenishment with diuresis  2) Patient assistance: - Pending  3)  Education  - Initial education done. To be completed prior to discharge  Thank you for allowing pharmacy to participate in this patient's care.  Reatha Harps, PharmD PGY2 Pharmacy Resident 10/22/2022 6:52 AM Check [email or web address] for unit specific pharmacy number

## 2022-10-23 DIAGNOSIS — I5043 Acute on chronic combined systolic (congestive) and diastolic (congestive) heart failure: Secondary | ICD-10-CM

## 2022-10-23 LAB — GLUCOSE, CAPILLARY
Glucose-Capillary: 181 mg/dL — ABNORMAL HIGH (ref 70–99)
Glucose-Capillary: 258 mg/dL — ABNORMAL HIGH (ref 70–99)
Glucose-Capillary: 95 mg/dL (ref 70–99)
Glucose-Capillary: 152 mg/dL — ABNORMAL HIGH (ref 70–99)

## 2022-10-23 LAB — BASIC METABOLIC PANEL
Anion gap: 8 (ref 5–15)
BUN: 74 mg/dL — ABNORMAL HIGH (ref 8–23)
CO2: 27 mmol/L (ref 22–32)
Calcium: 7.3 mg/dL — ABNORMAL LOW (ref 8.9–10.3)
Chloride: 109 mmol/L (ref 98–111)
Creatinine, Ser: 1.82 mg/dL — ABNORMAL HIGH (ref 0.44–1.00)
GFR, Estimated: 28 mL/min — ABNORMAL LOW (ref >60)
Glucose, Bld: 124 mg/dL — ABNORMAL HIGH (ref 70–99)
Potassium: 3.9 mmol/L (ref 3.5–5.1)
Sodium: 144 mmol/L (ref 135–145)

## 2022-10-23 MED ORDER — METOLAZONE 5 MG PO TABS
5.0000 mg | ORAL_TABLET | Freq: Once | ORAL | Status: AC
  Administered 2022-10-23: 5 mg via ORAL
  Filled 2022-10-23: qty 1

## 2022-10-23 MED ORDER — ISOSORBIDE MONONITRATE ER 30 MG PO TB24
30.0000 mg | ORAL_TABLET | Freq: Every day | ORAL | Status: DC
  Administered 2022-10-23: 30 mg via ORAL
  Filled 2022-10-23: qty 1

## 2022-10-23 NOTE — Progress Notes (Signed)
Progress Note   Patient: [name] SEG:[id number] DOB: [birth year] DOA: 10/16/2022     7 DOS: the patient was seen and examined on 10/23/2022   Brief hospital course: [name] was admitted to the hospital with the working diagnosis of heart failure decompensation.   81  yo female with the past medical history of AML, atrial fibrillation, COPD, coronary artery disease, T2DM, hypertension and heart failure who presented with edema. Patient had a recent hip fracture on 10/02/22 and had therapy at inpatient rehab. She noted worsening edema and decreased mobility, associated with dyspnea. On her initial physical examination her blood pressure was 129/52, HR 68, RR 22 and 02 saturation 93%, lungs with no wheezing or rhonchi, no rales, heart with S1 and S2 present and rhythmic, abdomen with no distention, positive lower extremity edema.   Na 140, K 4,4 Cl 111, bicarbonate 19, glucose 90 bun 78 cr 3,26  BNP 1,198 High sensitive troponin 10 and 12  Wbc 4,7 hgb 9,2 plt 136   Chest radiograph with cardiomegaly, with bilateral pleural effusions, more right than left, with bilateral hilar vascular congestion. Positive port on SVC RA junction.   EKG 66 bpm, normal axis, qtc with no prolongation, atrial fibrillation rhythm with no significant ST segment or T wave changes, low voltage.  Patient was placed on furosemide for diuresis.  Echocardiogram with preserved LV systolic function.    Assessment and Plan: * Acute on chronic diastolic (congestive) heart failure (HCC) Echocardiogram with preserved LV systolic function with EF 60 to 65%, mild LVH. RV systolic function is preserved. Moderate dilatation LA, moderate to severe TR, RVSP 40,9 mmHg. Trivial pericardial effusion. Mild to moderate aortic stenosis.   Urine output is 2,450 ml over last 24 hrs Systolic blood pressure 737 to 136 mmHg.  Plan to continue aggressive diuresis with furosemide IV 80 mg bid and metolazone Continue  metoprolol. Limited pharmacologic options due to reduced GFR.    AKI (acute kidney injury) (HCC) CKD stage 4.   Renal function with serum cr at 1,80 with K at 4,4 and serum bicarbonate at 29. Plan to continue aggressive diuresis, avoid hypotension and nephrotoxic medications.   Essential hypertension Continue blood pressure control with metoprolol and continue diuresis.  Close blood pressure monitoring   CAD (coronary artery disease) No chest pain, continue with antiplatelet therapy/   Persistent atrial fibrillation (HCC) Continue rate control with metoprolol and continue anticoagulation with apixaban.   Type 2 diabetes mellitus with hyperlipidemia (HCC) Continue glucose cover and monitoring with insulin sliding scale Basal insulin 8 units Fasting glucose this am 154 mg/dl.   Continue statin therapy with atorvastatin   Seizure ([place]) No active seizures  Continue with keppra and as needed lorazepam.   Chronic pain disorder Continue with analgesics Patient very weak and debilitated, at home uses walker and wheelchair for mobility. Poor prognosis.   DNR (do not resuscitate) DNR         Subjective: Patient with improvement of her symptoms but not back to baseline, continue to have dyspnea and edema   Physical Exam: Vitals:   10/23/22 0357 10/23/22 0400 10/23/22 0846 10/23/22 1115  BP: (!) 154/48  (!) 134/43 (!) 136/53  Pulse:   78 68  Resp: (!) '21  18 18  '$ Temp: 97.8 F (36.6 C)  97.8 F (36.6 C) (!) 97.5 F (36.4 C)  TempSrc: Oral  Oral Oral  SpO2: 97% 98% 94% 93%  Weight: 71.8 kg     Height:  Neurology awake and alert, deconditioned and ill looking appearing ENT with positive pallor Cardiovascular with S1 and S2 present, irregularly irregular with positive systolic murmur at the left lower sternal border. Mild JVD Positive pitting lower extremity edema, +++ at the thighs.  Compression stockings in place  Respiratory with scattered rhonchi and  rales Data Reviewed:    Family Communication: no family at the bedside   Disposition: Status is: Inpatient Remains inpatient appropriate because: IV diuresis   Planned Discharge Destination: Home  Author: Tawni Millers, MD 10/23/2022 11:55 AM  For on call review [email or web address].

## 2022-10-23 NOTE — Assessment & Plan Note (Addendum)
CKD stage 4.   Renal function with serum cr at 2,36 with K at 4,4 and serum bicarbonate at 33. Hold on diuretic therapy for now and follow up renal function in am. Avoid hypotension and nephrotoxic medications.

## 2022-10-23 NOTE — Assessment & Plan Note (Signed)
No chest pain, continue with antiplatelet therapy/

## 2022-10-23 NOTE — Assessment & Plan Note (Signed)
Continue with analgesics Patient very weak and debilitated, at home uses walker and wheelchair for mobility. Poor prognosis.

## 2022-10-23 NOTE — Progress Notes (Addendum)
   Heart Failure Stewardship Pharmacist Progress Note   PCP: Isaac Bliss, Rayford Halsted, MD PCP-Cardiologist: Mertie Moores, MD    HPI:  [name] is an 81 year old female with a PMH of CHF, NSTEMI s/p PCI to RCA, atrial flutter, AML, COPD, CKD, hypertension, hyperlipidemia and hepatic failure who presented with worsening edema and shortness of breath. She was recently discharged from rehab, reports symptom worsening despite adherence to diuretic regimen. In 08/2017, echocardiogram showed LVEF of 40-45% which has since improved to 60-65% on echocardiogram from 04/2022. Patient has previously tried Delene Loll, but it was stopped for 'itching.' AKI present on admission.  Peripheral edema is significantly improved, but remains present. Patient has no shortness of breath at rest this morning. Ambulation is improving, but she still becomes short of breath with exertion. Renal function has improved with creatinine near her baseline of ~1.6.   Current HF Medications: Diuretic: Lasix 80 mg IV BID Beta Blocker: metoprolol succinate 12.5 mg daily  Prior to admission HF Medications: Diuretic: torsemide 40 mg daily  Beta blocker: metoprolol succinate 12.5 mg daily ACE/ARB/ARNI: valsartan 80 mg daily SGLT2i: Jardiance 25 mg daily Other: Imdur 30 mg daily  Pertinent Lab Values: Serum creatinine 1.82, BUN 74, Potassium 3.9, Sodium 144, BNP 1198.5, Magnesium 2.1, A1c 9.9   Vital Signs: Weight: 1658.29 lbs (admission weight: 172.2 lbs) Blood pressure: labile, average 150/50s  Heart rate: 60-70s  I/O: net -1.3L yesterday, -6.5L this admission  Medication Assistance / Insurance Benefits Check: Does the patient have prescription insurance?  Yes Type of insurance plan: Medicare  Does the patient qualify for medication assistance through manufacturers or grants?   Pending Eligible grants and/or patient assistance programs: pending Medication assistance applications in progress: pending  Medication  assistance applications approved: pending Approved medication assistance renewals will be completed by: pending  Outpatient Pharmacy:  Prior to admission outpatient pharmacy: [facility] Is the patient willing to use [place] at discharge? Yes Is the patient willing to transition their outpatient pharmacy to utilize a [facility] outpatient pharmacy?   Pending   Assessment: 1. Acute on chronic diastolic HFimpEF (LVEF 37-04%) due to ICM. NYHA class III symptoms. - Continue strict I/Os along with daily weights. Maintain K >4 and Mg >2. Weight is discordant with I/Os earlier this admit, now UOP is well charted. - Scr remains elevated, but has improved with diuresis. Peripheral edema still present, but improved after compression hose.  - BP remains elevated with SBP in 150s. Can consider afterload reduction tomorrow if creatinine continues to improve.  Plan: 1) Medication changes recommended at this time: - Continue IV diuresis today x 1 more day  2) Patient assistance: - Pending  3)  Education  - Initial education done. To be completed prior to discharge  Thank you for allowing pharmacy to participate in this patient's care.  Reatha Harps, PharmD PGY2 Pharmacy Resident 10/23/2022 6:49 AM Check [email or web address] for unit specific pharmacy number

## 2022-10-23 NOTE — Assessment & Plan Note (Addendum)
No active seizures  Continue with keppra and as needed lorazepam.

## 2022-10-23 NOTE — Assessment & Plan Note (Addendum)
Her glucose remained well controlled during her hospitalization, she received insulin sliding scale and basal insulin.  At the time of her discharge her serum fasting glucose is 131 mg/dl.   Continue statin therapy with atorvastatin

## 2022-10-23 NOTE — Progress Notes (Signed)
Mobility Specialist Progress Note    10/23/22 0935  Mobility  Activity Ambulated with assistance in hallway  Level of Assistance Contact guard assist, steadying assist  Assistive Device Four wheel walker  Distance Ambulated (ft) 270 ft (50+220)  RLE Weight Bearing WBAT  Activity Response Tolerated fair  Mobility Referral Yes  $Mobility charge 1 Mobility   Pre-Mobility: 90 HR During Mobility: 120 HR, 95-97% SpO2 on 2LO2 Post-Mobility: 80 HR  Pt received in chair and agreeable. No complaints on walk. Took x1 seated rest break ~2 minutes. Encouraged pursed lip breathing. Required 2LO2 to maintain SpO2 >/=88% with a good pleth. Returned to chair with call bell in reach.    Hildred Alamin Mobility Specialist  Please Psychologist, sport and exercise or Rehab Office at [phone]

## 2022-10-23 NOTE — Assessment & Plan Note (Signed)
Continue rate control with metoprolol and continue anticoagulation with apixaban.

## 2022-10-23 NOTE — Assessment & Plan Note (Addendum)
Continue blood pressure control with metoprolol  Close blood pressure monitoring

## 2022-10-23 NOTE — Assessment & Plan Note (Signed)
DNR

## 2022-10-23 NOTE — Progress Notes (Addendum)
Rounding Note    Patient Name: [name] Date of Encounter: 10/23/2022  [place] Cardiologist: Mertie Moores, MD   Subjective   No acute overnight events. Patient is doing well this morning. She continues to diuresis well. O2 sats currently around 94-96% on room air (briefly dropped to the high 80s low 90s after talking for a while but quickly came back up after taking a few deep breaths). She still reports that she is getting short of breath if she moves around a lot but does states she was able to walk down the hall yesterday without stopping. She still has significant lower extremity edema but it is improving. No chest pain. Not aware of her atrial fibrillation.  Inpatient Medications    Scheduled Meds:  apixaban  2.5 mg Oral BID   atorvastatin  40 mg Oral Daily   Chlorhexidine Gluconate Cloth  6 each Topical Daily   clopidogrel  75 mg Oral QPM   docusate sodium  100 mg Oral BID   furosemide  80 mg Intravenous BID   insulin aspart  0-9 Units Subcutaneous TID WC   insulin detemir  8 Units Subcutaneous Daily   letrozole  2.5 mg Oral Daily   levETIRAcetam  500 mg Oral BID   melatonin  3 mg Oral QHS   metoprolol succinate  12.5 mg Oral Daily   polyethylene glycol  17 g Oral Daily   psyllium  1 packet Oral Daily   sodium chloride flush  3 mL Intravenous Q12H   Continuous Infusions:  PRN Meds: acetaminophen **OR** acetaminophen, bisacodyl, fluticasone furoate-vilanterol, hydrALAZINE, ipratropium-albuterol, LORazepam, methocarbamol, ondansetron **OR** ondansetron (ZOFRAN) IV, oxyCODONE, traZODone   Vital Signs    Vitals:   10/22/22 1845 10/22/22 1937 10/23/22 0357 10/23/22 0400  BP: (!) 158/49 (!) 151/61 (!) 154/48   Pulse: 79 86    Resp: (!) 23 (!) 22 (!) 21   Temp:  97.7 F (36.5 C) 97.8 F (36.6 C)   TempSrc:  Oral Oral   SpO2: 97% 100% 97% 98%  Weight:   71.8 kg   Height:        Intake/Output Summary (Last 24 hours) at 10/23/2022 0733 Last  data filed at 10/23/2022 2993 Gross per 24 hour  Intake 720 ml  Output 2450 ml  Net -1730 ml      10/23/2022    3:57 AM 10/22/2022    3:43 AM 10/21/2022    5:00 AM  Last 3 Weights  Weight (lbs) 158 lb 4.6 oz 160 lb 12.8 oz 165 lb 2 oz  Weight (kg) 71.8 kg 72.938 kg 74.9 kg      Telemetry    Atrial fibrillation with rates in the 60s to 80s. - Personally Reviewed  ECG    No new ECG tracing today. - Personally Reviewed  Physical Exam   GEN: No acute distress.   Neck: No JVD noted with patient sitting upright at 90 degrees. Cardiac: Irregularly irregular rhythm with normal rate. No murmurs, rubs, or gallops.  Respiratory: No increased work of breathing. Decreased breath sounds in bilateral bases (right > left). No wheezes, rhonchi, or rales appreciated. GI: Soft, non-distended, and non-tender. MS: 2+ pitting edema up to thighs (improving). No deformity. Skin: Warm and dry. Neuro:  No focal deficits. Psych: Normal affect. Responds appropriately.  Labs    High Sensitivity Troponin:   Recent Labs  Lab 10/16/22 0342 10/16/22 0510  TROPONINIHS 10 12     Chemistry Recent [employer]  10/19/22 0530 10/20/22 0540 10/21/22 0317 10/21/22 0500 10/22/22 0354 10/23/22 0412  NA 145 145 146*  --  142 144  K 4.5 4.1 3.8  --  3.7 3.9  CL 114* 111 111  --  109 109  CO2 21* 22 24  --  25 27  GLUCOSE 114* 175* 74  --  125* 124*  BUN 79* 78* 78*  --  72* 74*  CREATININE 2.53* 2.33* 2.03*  --  1.93* 1.82*  CALCIUM 6.7* 6.9* 6.9*  --  6.8* 7.3*  MG  --  1.7  --  1.7 2.1  --   PROT 5.5*  --   --   --   --   --   ALBUMIN 3.0*  --   --   --   --   --   AST 13*  --   --   --   --   --   ALT 9  --   --   --   --   --   ALKPHOS 100  --   --   --   --   --   BILITOT 0.6  --   --   --   --   --   GFRNONAA 19* 21* 24*  --  26* 28*  ANIONGAP _0 --  8 8    Lipids No results for input(s): "CHOL", "TRIG", "HDL", "LABVLDL", "LDLCALC", "CHOLHDL" in the last 168 hours.   Hematology Recent Labs  Lab 10/17/22 0315 10/19/22 0530 10/22/22 0354  WBC 4.3 6.1 4.2  RBC 2.91* 2.98* 2.72*  HGB 9.0* 9.4* 8.6*  HCT 28.1* 29.3* 26.1*  MCV 96.6 98.3 96.0  MCH 30.9 31.5 31.6  MCHC 32.0 32.1 33.0  RDW 15.7* 16.2* 15.7*  PLT 122* 115* 93*   Thyroid No results for input(s): "TSH", "FREET4" in the last 168 hours.  BNPNo results for input(s): "BNP", "PROBNP" in the last 168 hours.  DDimer No results for input(s): "DDIMER" in the last 168 hours.   Radiology    No results found.  Cardiac Studies   Echocardiogram 10/16/2022: Impressions: 1. Left ventricular ejection fraction, by estimation, is 60 to 65%. The  left ventricle has normal function. The left ventricle has no regional  wall motion abnormalities. There is mild left ventricular hypertrophy.  Left ventricular diastolic parameters  are indeterminate.   2. Right ventricular systolic function is normal. The right ventricular  size is normal. There is mildly elevated pulmonary artery systolic  pressure.   3. Left atrial size was moderately dilated.   4. The mitral valve is degenerative. Mild mitral valve regurgitation. No  evidence of mitral stenosis. Severe mitral annular calcification.   5. Tricuspid valve regurgitation is moderate to severe.   6. The aortic valve is normal in structure. There is severe calcifcation  of the aortic valve. There is severe thickening of the aortic valve.  Aortic valve regurgitation is not visualized. Mild to moderate aortic  valve stenosis.   7. The inferior vena cava is normal in size with greater than 50%  respiratory variability, suggesting right atrial pressure of 3 mmHg.   Patient Profile     81 y.o. female with a history of CAD with NSTEMI s/p DES to RCA in 2018, chronic combined CHF with EF of 40-45% at time of NSTEMI but has since normalized, persistent atrial fibrillation on Eliquis, cirrhosis, hypertension, hyperlipidemia, type 2 diabetes mellitus, COPD,  uterine cancer, breast cancer, and AML with  who was admitted on 10/16/2022 for acute on chronic CHF and AKI after presenting with worsening lower extremity edema.   Assessment & Plan    Acute on Chronic Combined CHF with Recovered EF LVEF as low as 40-45% in 2018 at time of NSTEMI but has since recovered. Patient presented with worsening lower extremity edema. BNP elevated 1,198. Chest x-ray showed increased right pleural effusion (now moderate) with associated atelectasis and trace left pleural effusion. Echo showed LVEF of 60-65% with no regional wall motion abnormalities, normal RV, moderately dilated left atrium, severe MAC with mild MAC, severe aortic valve calcification with mild to moderate AS. She is being diuresed with IV Lasix and has also received a couple of doses of Metolazone. Documented urinary output of 2.45 L yesterday and net negative 6.49 L since admission. Weight down 14 lbs since admission. Renal function slowly improving.  - She still has significant lower extremity edema and decreased breath sounds in bilateral bases (right > left). However, ReDs clip reading 32% yesterday.  - Continue IV Lasix 80m twice daily for now. - Continue Toprol-XL 12.534mdaily. - Home Losartan and Jardiance currently on hold due to renal function. GFR currently 28. - Albumin was 3.0 on admission which is likely contributing. Will defer management of this to primary team.  - Continue to monitor daily weights, strict I/Os, and renal function.   CAD History of NSTEMI in 2018 at which time cardiac catheterization showed multivessel CAD. Felt to be too high risk for CABG at that time so underwent successful PCI with DES to RCA. - No angina. - No aspirin due to DOAC. - Continue beta-blocker and statin.   Persistent Atrial Fibrillation Rate controlled. - Continue Toprol-XL 12.30m62maily. - Continue chronic anticoagulation with Eliquis 2.30mg44mice daily (reduced dose due to age and renal function).    Hypertension BP mildly elevated at times. - Continue Toprol-XL 12.30mg 29mly. - Will restart home Imdur 30mg 60my. - Home Valsartan has been on hold due to AKI.     Hyperlipidemia - Continue Lipitor 40mg d27m.   Acute vs CKD Stage III Creatinine 3.26 on admission. Baseline around 1.8 to 2.0. Likely due to renal congestion. Renal ultrasound negative for hydronephrosis and suggestive of medical renal disease. - Creatinine back to baseline today at 1.82.  - Continue to monitor closely with diuresis.   Otherwise, per primary team: - Type 2 diabetes mellitus: hemoglobin A1c 9.9% - AML  - History of breast cancer and uterine cancer - Seizure disorder - Chronic pain  For questions or updates, please contact [facility] consult [email or web address] for contact info under        Signed, Jordynn Marcella Darreld Mclean 10/23/2022, 7:33 AM    ADDENDUM:  Discussed with Dr. RandolpOval Linsey low diastolic BP will hold off on adding Imdur for now and continue to monitor BP.  Cai Anfinson Darreld Mclean11/16/2023 10:18 AM

## 2022-10-23 NOTE — Assessment & Plan Note (Addendum)
Echocardiogram with preserved LV systolic function with EF 60 to 65%, mild LVH. RV systolic function is preserved. Moderate dilatation LA, moderate to severe TR, RVSP 40,9 mmHg. Trivial pericardial effusion. Mild to moderate aortic stenosis.   Urine output is 3,500 ml over last 24 hrs Systolic blood pressure 320 to 150 mmHg.  Had one dose of metolazone Continue with metoprolol. Hold on spironolactone due to worsening serum cr  Limited pharmacologic options due to reduced GFR.    Acute on chronic hypoxemic respiratory failure due to acute cardiogenic pulmonary edema Clinically improving with diuresis.

## 2022-10-23 NOTE — Plan of Care (Signed)
  Problem: Clinical Measurements: Goal: Will remain free from infection Outcome: Completed/Met   Problem: Nutrition: Goal: Adequate nutrition will be maintained Outcome: Completed/Met   

## 2022-10-23 NOTE — Hospital Course (Addendum)
[name] was admitted to the hospital with the working diagnosis of heart failure decompensation.   81  yo female with the past medical history of AML, atrial fibrillation, COPD, coronary artery disease, T2DM, hypertension and heart failure who presented with edema. Patient had a recent hip fracture on 10/02/22 and had therapy at inpatient rehab. She noted worsening edema and decreased mobility, associated with dyspnea. On her initial physical examination her blood pressure was 129/52, HR 68, RR 22 and 02 saturation 93%, lungs with no wheezing or rhonchi, no rales, heart with S1 and S2 present and rhythmic, abdomen with no distention, positive lower extremity edema.   Na 140, K 4,4 Cl 111, bicarbonate 19, glucose 90 bun 78 cr 3,26  BNP 1,198 High sensitive troponin 10 and 12  Wbc 4,7 hgb 9,2 plt 136   Chest radiograph with cardiomegaly, with bilateral pleural effusions, more right than left, with bilateral hilar vascular congestion. Positive port on SVC RA junction.   EKG 66 bpm, normal axis, qtc with no prolongation, atrial fibrillation rhythm with no significant ST segment or T wave changes, low voltage.  Patient was placed on furosemide for diuresis.  Echocardiogram with preserved LV systolic function.   11/17 her volume status is improving with diuresis.  11/18 volume status continue to improve, possible dc home tomorrow

## 2022-10-24 DIAGNOSIS — G894 Chronic pain syndrome: Secondary | ICD-10-CM

## 2022-10-24 DIAGNOSIS — I10 Essential (primary) hypertension: Secondary | ICD-10-CM

## 2022-10-24 DIAGNOSIS — Z66 Do not resuscitate: Secondary | ICD-10-CM

## 2022-10-24 DIAGNOSIS — I25118 Atherosclerotic heart disease of native coronary artery with other forms of angina pectoris: Secondary | ICD-10-CM

## 2022-10-24 DIAGNOSIS — R569 Unspecified convulsions: Secondary | ICD-10-CM

## 2022-10-24 LAB — BASIC METABOLIC PANEL
Anion gap: 11 (ref 5–15)
BUN: 69 mg/dL — ABNORMAL HIGH (ref 8–23)
CO2: 29 mmol/L (ref 22–32)
Calcium: 7.6 mg/dL — ABNORMAL LOW (ref 8.9–10.3)
Chloride: 103 mmol/L (ref 98–111)
Creatinine, Ser: 1.67 mg/dL — ABNORMAL HIGH (ref 0.44–1.00)
GFR, Estimated: 31 mL/min — ABNORMAL LOW (ref >60)
Glucose, Bld: 102 mg/dL — ABNORMAL HIGH (ref 70–99)
Potassium: 3.7 mmol/L (ref 3.5–5.1)
Sodium: 143 mmol/L (ref 135–145)

## 2022-10-24 LAB — GLUCOSE, CAPILLARY
Glucose-Capillary: 97 mg/dL (ref 70–99)
Glucose-Capillary: 110 mg/dL — ABNORMAL HIGH (ref 70–99)
Glucose-Capillary: 229 mg/dL — ABNORMAL HIGH (ref 70–99)
Glucose-Capillary: 226 mg/dL — ABNORMAL HIGH (ref 70–99)
Glucose-Capillary: 120 mg/dL — ABNORMAL HIGH (ref 70–99)

## 2022-10-24 LAB — MAGNESIUM: Magnesium: 1.8 mg/dL (ref 1.7–2.4)

## 2022-10-24 MED ORDER — MAGNESIUM SULFATE 2 GM/50ML IV SOLN
2.0000 g | Freq: Once | INTRAVENOUS | Status: AC
  Administered 2022-10-24: 2 g via INTRAVENOUS
  Filled 2022-10-24: qty 50

## 2022-10-24 MED ORDER — SPIRONOLACTONE 25 MG PO TABS
25.0000 mg | ORAL_TABLET | Freq: Every day | ORAL | Status: DC
  Administered 2022-10-24 – 2022-10-25 (×2): 25 mg via ORAL
  Filled 2022-10-24 (×2): qty 1

## 2022-10-24 MED ORDER — POTASSIUM CHLORIDE CRYS ER 20 MEQ PO TBCR: 20.0000 meq | EXTENDED_RELEASE_TABLET | Freq: Once | ORAL | Status: DC

## 2022-10-24 MED ORDER — ALUM & MAG HYDROXIDE-SIMETH 200-200-20 MG/5ML PO SUSP
15.0000 mL | Freq: Two times a day (BID) | ORAL | Status: DC | PRN
  Administered 2022-10-24: 15 mL via ORAL
  Filled 2022-10-24: qty 30

## 2022-10-24 NOTE — Progress Notes (Signed)
   10/24/22 1209  Mobility  Activity Ambulated with assistance in hallway  Level of Assistance Contact guard assist, steadying assist  Assistive Device Four wheel walker  Distance Ambulated (ft) 230 ft  Activity Response Tolerated well  Mobility Referral Yes  $Mobility charge 1 Mobility   Mobility Specialist Progress Note  Pt was in bed and agreeable. X1 standing break d/t SOB. Had no c/o pain throughout ambulation. Returned to bed w/ all needs met and call bell in reach.   Lucious Groves Mobility Specialist

## 2022-10-24 NOTE — Progress Notes (Signed)
   Heart Failure Stewardship Pharmacist Progress Note   PCP: Isaac Bliss, Rayford Halsted, MD PCP-Cardiologist: Mertie Moores, MD    HPI:  [name] is an 81 year old female with a PMH of CHF, NSTEMI s/p PCI to RCA, atrial flutter, AML, COPD, CKD, hypertension, hyperlipidemia and hepatic failure who presented with worsening edema and shortness of breath. She was recently discharged from rehab, reports symptom worsening despite adherence to diuretic regimen. In 08/2017, echocardiogram showed LVEF of 40-45% which has since improved to 60-65% on echocardiogram from 04/2022. Patient has previously tried Delene Loll, but it was stopped for 'itching.' AKI present on admission.  Current HF Medications: Diuretic: Lasix 80 mg IV BID Beta Blocker: metoprolol succinate 12.5 mg daily  Prior to admission HF Medications: Diuretic: torsemide 40 mg daily  Beta blocker: metoprolol succinate 12.5 mg daily ACE/ARB/ARNI: valsartan 80 mg daily SGLT2i: Jardiance 25 mg daily Other: Imdur 30 mg daily  Pertinent Lab Values: Serum creatinine 1.67, BUN 69, Potassium 3.7, Sodium 143, BNP 1198.5, Magnesium 1.8, A1c 9.9   Vital Signs: Weight: 155 lbs (admission weight: 172.2 lbs) Blood pressure: 130-150/60s Heart rate: 70s  I/O: -2.8L yesterday, net -8.6L this admission  Medication Assistance / Insurance Benefits Check: Does the patient have prescription insurance?  Yes Type of insurance plan: Medicare  Does the patient qualify for medication assistance through manufacturers or grants?   Pending Eligible grants and/or patient assistance programs: pending Medication assistance applications in progress: pending  Medication assistance applications approved: pending Approved medication assistance renewals will be completed by: pending  Outpatient Pharmacy:  Prior to admission outpatient pharmacy: [facility] Is the patient willing to use [place] at discharge? Yes Is the patient willing to transition  their outpatient pharmacy to utilize a [facility] outpatient pharmacy?   Pending   Assessment: 1. Acute on chronic diastolic HFimpEF (LVEF 36-46%) due to ICM. NYHA class III symptoms. - Continue strict I/Os along with daily weights. Maintain K >4 and Mg >2. Weight is discordant with I/Os earlier this admit, now UOP is well charted. Scr has improved with diuresis. Peripheral edema still present, but improved after compression hose. Consider transitioning to torsemide with PRN metolazone at discharge. - Consider restarting ARB (intolerance to Entresto in the past with itching)  - BP remains elevated with SBP in 130-150s. Consider starting spironolactone 25 mg daily.  - Consider restarting Jardiance on discharge  Plan: 1) Medication changes recommended at this time: - Start spironolactone 25 mg daily - Consider transitioning back to torsemide with PRN metolazone at discharge  2) Patient assistance: - Pending  3)  Education  - Initial education done. To be completed prior to discharge  Kerby Nora, PharmD, BCPS Heart Failure Stewardship Pharmacist Phone [phone]  Please check [email or web address] for unit-specific pharmacist phone numbers

## 2022-10-24 NOTE — Progress Notes (Signed)
Physical Therapy Treatment Patient Details Name: [name] MRN: [id number] DOB: 17-Jul-1941 Today's Date: 10/24/2022   History of Present Illness The pt is an 81 yo female presenting 11/9 with SOB and LE edema, found to have CHF exacerbation. Pt with recent admission for hip fx, d/c from inpatient rehab 10/26. PMH includes: arthritis, cardiomyopathy, CKD IV, CHF, COPD, CAD, DM II, HLD, HTN, and NSTEMI.    PT Comments    The pt is agreeable to session with focus on progressing gait and exercises for R hip. The pt continues to demo good independence with sit-stand transfers, and safe use of rollator with transfers and gait. She demos good stability and progression of endurance for longer hallway ambulation at this time. Continues to demo deficits in RLE strength and stability resulting in decreased stride and clearance. Pt then given standing exercises for R hip stabilization and strengthening which she tolerated well. Dependent on BUE support for balance. Recommendations remain appropriate at this time.     Recommendations for follow up therapy are one component of a multi-disciplinary discharge planning process, led by the attending physician.  Recommendations may be updated based on patient status, additional functional criteria and insurance authorization.  Follow Up Recommendations  Home health PT     Assistance Recommended at Discharge Frequent or constant Supervision/Assistance  Patient can return home with the following A little help with walking and/or transfers;A little help with bathing/dressing/bathroom;Assistance with cooking/housework;Help with stairs or ramp for entrance   Equipment Recommendations  None recommended by PT    Recommendations for Other Services       Precautions / Restrictions Precautions Precautions: Fall Precaution Comments: monitor SpO2 to maintain >92%, hx of seizure disorder, no R LE ROM restrictions Restrictions Weight Bearing Restrictions:  No RLE Weight Bearing: Weight bearing as tolerated     Mobility  Bed Mobility Overal bed mobility: Needs Assistance             General bed mobility comments: pt OOB in recliner at start and end of session    Transfers Overall transfer level: Needs assistance Equipment used: Rollator (4 wheels) Transfers: Sit to/from Stand Sit to Stand: Min guard           General transfer comment: minG to power up to standing, no overt LOB or need for assist. good control of rollator    Ambulation/Gait Ambulation/Gait assistance: Min guard Gait Distance (Feet): 250 Feet Assistive device: Rollator (4 wheels) Gait Pattern/deviations: Step-through pattern, Decreased stance time - right, Decreased stride length, Knee flexed in stance - right, Trunk flexed Gait velocity: 0.19 m/s Gait velocity interpretation: <1.31 ft/sec, indicative of household ambulator   General Gait Details: RLE in external rotation, less DF, progressivly decreased clarance with fatigue and slowed gait. no overt LOB    Balance Overall balance assessment: Needs assistance Sitting-balance support: No upper extremity supported, Feet supported Sitting balance-Leahy Scale: Fair     Standing balance support: Bilateral upper extremity supported, During functional activity, No upper extremity supported Standing balance-Leahy Scale: Fair Standing balance comment: can stand to comb hair at sink without UE support, Bil UE support for gait                            Cognition Arousal/Alertness: Awake/alert Behavior During Therapy: WFL for tasks assessed/performed Overall Cognitive Status: Within Functional Limits for tasks assessed  Exercises Total Joint Exercises Hip ABduction/ADduction: AROM, Both, 10 reps, Standing Knee Flexion: AROM, Both, 10 reps, Standing Marching in Standing: AROM, Both, 10 reps, Standing Standing Hip Extension: AROM, Both,  10 reps    General Comments General comments (skin integrity, edema, etc.): VSS on RA      Pertinent Vitals/Pain Pain Assessment Pain Assessment: No/denies pain Faces Pain Scale: Hurts little more Pain Location: BLE Pain Descriptors / Indicators: Discomfort, Spasm Pain Intervention(s): Limited activity within patient's tolerance, Monitored during session, Repositioned, Ice applied     PT Goals (current goals can now be found in the care plan section) Acute Rehab PT Goals Patient Stated Goal: return home PT Goal Formulation: With patient Time For Goal Achievement: 10/31/22 Potential to Achieve Goals: Good Progress towards PT goals: Progressing toward goals    Frequency    Min 3X/week      PT Plan Current plan remains appropriate       AM-PAC PT "6 Clicks" Mobility   Outcome Measure  Help needed turning from your back to your side while in a flat bed without using bedrails?: A Little Help needed moving from lying on your back to sitting on the side of a flat bed without using bedrails?: A Little Help needed moving to and from a bed to a chair (including a wheelchair)?: A Little Help needed standing up from a chair using your arms (e.g., wheelchair or bedside chair)?: A Little Help needed to walk in hospital room?: A Little Help needed climbing 3-5 steps with a railing? : A Lot 6 Click Score: 17    End of Session Equipment Utilized During Treatment: Oxygen Activity Tolerance: Patient tolerated treatment well Patient left: in chair;with call bell/phone within reach Nurse Communication: Mobility status PT Visit Diagnosis: Unsteadiness on feet (R26.81);Muscle weakness (generalized) (M62.81);Other abnormalities of gait and mobility (R26.89)     Time: 1660-6301 PT Time Calculation (min) (ACUTE ONLY): 31 min  Charges:  $Gait Training: 8-22 mins $Therapeutic Exercise: 8-22 mins                     West Carbo, PT, DPT   Acute Rehabilitation Department   Sandra Cockayne 10/24/2022, 1:58 PM

## 2022-10-24 NOTE — Progress Notes (Addendum)
Progress Note   Patient: [name] JKK:[id number] DOB: 08/18/1941 DOA: 10/16/2022     8 DOS: the patient was seen and examined on 10/24/2022   Brief hospital course: [name] was admitted to the hospital with the working diagnosis of heart failure decompensation.   81  yo female with the past medical history of AML, atrial fibrillation, COPD, coronary artery disease, T2DM, hypertension and heart failure who presented with edema. Patient had a recent hip fracture on 10/02/22 and had therapy at inpatient rehab. She noted worsening edema and decreased mobility, associated with dyspnea. On her initial physical examination her blood pressure was 129/52, HR 68, RR 22 and 02 saturation 93%, lungs with no wheezing or rhonchi, no rales, heart with S1 and S2 present and rhythmic, abdomen with no distention, positive lower extremity edema.   Na 140, K 4,4 Cl 111, bicarbonate 19, glucose 90 bun 78 cr 3,26  BNP 1,198 High sensitive troponin 10 and 12  Wbc 4,7 hgb 9,2 plt 136   Chest radiograph with cardiomegaly, with bilateral pleural effusions, more right than left, with bilateral hilar vascular congestion. Positive port on SVC RA junction.   EKG 66 bpm, normal axis, qtc with no prolongation, atrial fibrillation rhythm with no significant ST segment or T wave changes, low voltage.  Patient was placed on furosemide for diuresis.  Echocardiogram with preserved LV systolic function.   11/17 her volume status is improving with diuresis.   Assessment and Plan: * Acute on chronic diastolic (congestive) heart failure (HCC) Echocardiogram with preserved LV systolic function with EF 60 to 65%, mild LVH. RV systolic function is preserved. Moderate dilatation LA, moderate to severe TR, RVSP 40,9 mmHg. Trivial pericardial effusion. Mild to moderate aortic stenosis.   Urine output is 2,800 ml over last 24 hrs Systolic blood pressure 716 to 150 mmHg.  Aggressive diuresis with furosemide IV 80 mg bid and  metolazone Continue metoprolol. Added spironolactone today  Limited pharmacologic options due to reduced GFR.    AKI (acute kidney injury) (HCC) CKD stage 4.   Patient with improvement in volume status, renal function today with serum cr at 1,67 with K at 3,7 and serum bicarbonate at 29, Mg 1,8  Plan to continue aggressive diuresis, avoid hypotension and nephrotoxic medications.  Add  2 g Mag sulfate to prevent hypomagnesemia.   Essential hypertension Continue blood pressure control with metoprolol and continue diuresis.  Close blood pressure monitoring   CAD (coronary artery disease) No chest pain, continue with antiplatelet therapy/   Persistent atrial fibrillation (HCC) Continue rate control with metoprolol and continue anticoagulation with apixaban.   Type 2 diabetes mellitus with hyperlipidemia (HCC) Continue glucose cover and monitoring with insulin sliding scale Basal insulin 8 units Fasting glucose this am '102mg'$ /dl.   Continue statin therapy with atorvastatin   Seizure ([place]) No active seizures  Continue with keppra and as needed lorazepam.   Chronic pain disorder Continue with analgesics Patient very weak and debilitated, at home uses walker and wheelchair for mobility. Poor prognosis.   DNR (do not resuscitate) DNR         Subjective: Patient with improvement in dyspnea and edema, today she is on room air   Physical Exam: Vitals:   10/24/22 0318 10/24/22 0806 10/24/22 1024 10/24/22 1136  BP: 139/64 (!) 136/39 (!) 162/66 (!) 154/59  Pulse: 74 79 71 68  Resp: 16 19 (!) 21 18  Temp: (!) 97.5 F (36.4 C) 97.6 F (36.4 C)  97.8 F (36.6  C)  TempSrc: Oral Oral  Oral  SpO2: 91% 90% 93% 94%  Weight: 70.5 kg     Height:       Neurology awake and alert ENT with mild pallor Cardiovascular with S1 and S2 present and with no gallops, rubs or murmurs No JVD Improved edema at her thighs Respiratory with no rales or wheezing Abdomen with no distention   Data Reviewed:    Family Communication: no family at the bedside   Disposition: Status is: Inpatient Remains inpatient appropriate because: heart failure   Planned Discharge Destination: Home      Author: Tawni Millers, MD 10/24/2022 3:10 PM  For on call review [email or web address].

## 2022-10-24 NOTE — Progress Notes (Signed)
Rounding Note    Patient Name: [name] Date of Encounter: 10/24/2022  [place] Cardiologist: Catherine Moores, MD   Subjective   Feeling well.  Her breathing is improving.    Inpatient Medications    Scheduled Meds:  apixaban  2.5 mg Oral BID   atorvastatin  40 mg Oral Daily   Chlorhexidine Gluconate Cloth  6 each Topical Daily   clopidogrel  75 mg Oral QPM   docusate sodium  100 mg Oral BID   furosemide  80 mg Intravenous BID   insulin aspart  0-9 Units Subcutaneous TID WC   insulin detemir  8 Units Subcutaneous Daily   letrozole  2.5 mg Oral Daily   levETIRAcetam  500 mg Oral BID   melatonin  3 mg Oral QHS   metoprolol succinate  12.5 mg Oral Daily   polyethylene glycol  17 g Oral Daily   psyllium  1 packet Oral Daily   sodium chloride flush  3 mL Intravenous Q12H   Continuous Infusions:  PRN Meds: acetaminophen **OR** acetaminophen, alum & mag hydroxide-simeth, bisacodyl, fluticasone furoate-vilanterol, ipratropium-albuterol, LORazepam, methocarbamol, ondansetron **OR** ondansetron (ZOFRAN) IV, oxyCODONE, traZODone   Vital Signs    Vitals:   10/23/22 1115 10/23/22 1923 10/24/22 0318 10/24/22 0806  BP: (!) 136/53 (!) 159/67 139/64 (!) 136/39  Pulse: 68 74 74 79  Resp: '18 16 16 19  '$ Temp: (!) 97.5 F (36.4 C) (!) 97.3 F (36.3 C) (!) 97.5 F (36.4 C) 97.6 F (36.4 C)  TempSrc: Oral Oral Oral Oral  SpO2: 93% 95% 91% 90%  Weight:   70.5 kg   Height:        Intake/Output Summary (Last 24 hours) at 10/24/2022 0831 Last data filed at 10/24/2022 0600 Gross per 24 hour  Intake 460 ml  Output 2700 ml  Net -2240 ml      10/24/2022    3:18 AM 10/23/2022    3:57 AM 10/22/2022    3:43 AM  Last 3 Weights  Weight (lbs) 155 lb 6.8 oz 158 lb 4.6 oz 160 lb 12.8 oz  Weight (kg) 70.5 kg 71.8 kg 72.938 kg      Telemetry    Atrial fibrillation.  Rate less than 100 bpm.- Personally Reviewed  ECG    N/a - Personally Reviewed  Physical Exam    VS:  BP (!) 136/39 (BP Location: Left Arm)   Pulse 79   Temp 97.6 F (36.4 C) (Oral)   Resp 19   Ht '5\' 2"'$  (1.575 m)   Wt 70.5 kg   SpO2 90%   BMI 28.43 kg/m  , BMI Body mass index is 28.43 kg/m. GENERAL: Chronically ill-appearing.  No acute distress. HEENT: Pupils equal round and reactive, fundi not visualized, oral mucosa unremarkable NECK:  + jugular venous distention, waveform within normal limits, carotid upstroke brisk and symmetric, no bruits, no thyromegaly LUNGS:  Clear to auscultation bilaterally HEART:  RRR.  PMI not displaced or sustained,S1 and S2 within normal limits, no S3, no S4, no clicks, no rubs, no murmurs ABD:  Flat, positive bowel sounds normal in frequency in pitch, no bruits, no rebound, no guarding, no midline pulsatile mass, no hepatomegaly, no splenomegaly EXT:  2 plus pulses throughout, 2+ LE edema to the mid tibia bilaterally, no cyanosis no clubbing SKIN:  No rashes no nodules NEURO:  Cranial nerves II through XII grossly intact, motor grossly intact throughout PSYCH:  Cognitively intact, oriented to person place and time  Labs    High Sensitivity Troponin:   Recent Labs  Lab 10/16/22 0342 10/16/22 0510  TROPONINIHS 10 12     Chemistry Recent Labs  Lab 10/19/22 0530 10/20/22 0540 10/21/22 0500 10/22/22 0354 10/23/22 0412 10/24/22 0330  NA 145   < >  --  142 144 143  K 4.5   < >  --  3.7 3.9 3.7  CL 114*   < >  --  109 109 103  CO2 21*   < >  --  '25 27 29  '$ GLUCOSE 114*   < >  --  125* 124* 102*  BUN 79*   < >  --  72* 74* 69*  CREATININE 2.53*   < >  --  1.93* 1.82* 1.67*  CALCIUM 6.7*   < >  --  6.8* 7.3* 7.6*  MG  --    < > 1.7 2.1  --  1.8  PROT 5.5*  --   --   --   --   --   ALBUMIN 3.0*  --   --   --   --   --   AST 13*  --   --   --   --   --   ALT 9  --   --   --   --   --   ALKPHOS 100  --   --   --   --   --   BILITOT 0.6  --   --   --   --   --   GFRNONAA 19*   < >  --  26* 28* 31*  ANIONGAP 10   < >  --  '8 8 11   '$ <  > = values in this interval not displayed.    Lipids No results for input(s): "CHOL", "TRIG", "HDL", "LABVLDL", "LDLCALC", "CHOLHDL" in the last 168 hours.  Hematology Recent Labs  Lab 10/19/22 0530 10/22/22 0354  WBC 6.1 4.2  RBC 2.98* 2.72*  HGB 9.4* 8.6*  HCT 29.3* 26.1*  MCV 98.3 96.0  MCH 31.5 31.6  MCHC 32.1 33.0  RDW 16.2* 15.7*  PLT 115* 93*   Thyroid No results for input(s): "TSH", "FREET4" in the last 168 hours.  BNP No results for input(s): "BNP", "PROBNP" in the last 168 hours.   DDimer No results for input(s): "DDIMER" in the last 168 hours.   Radiology    No results found.  Cardiac Studies   Echo 10/16/2022:   1. Left ventricular ejection fraction, by estimation, is 60 to 65%. The  left ventricle has normal function. The left ventricle has no regional  wall motion abnormalities. There is mild left ventricular hypertrophy.  Left ventricular diastolic parameters  are indeterminate.   2. Right ventricular systolic function is normal. The right ventricular  size is normal. There is mildly elevated pulmonary artery systolic  pressure.   3. Left atrial size was moderately dilated.   4. The mitral valve is degenerative. Mild mitral valve regurgitation. No  evidence of mitral stenosis. Severe mitral annular calcification.   5. Tricuspid valve regurgitation is moderate to severe.   6. The aortic valve is normal in structure. There is severe calcifcation  of the aortic valve. There is severe thickening of the aortic valve.  Aortic valve regurgitation is not visualized. Mild to moderate aortic  valve stenosis.   7. The inferior vena cava is normal in size with greater than 50%  respiratory variability, suggesting right atrial  pressure of 3 mmHg.   Patient Profile     [name] is an 18F with CAD s/p NSTEMI and RCA PCI, HFmrEF and recovered LV function, persistent atrial fibrillation, mild-moderate aortic stenosis, hypertension, hyperlipidemia, DM, COPD, breast  cancer, AML, and cirrhosis admitted with acute on chronic diastolic heart failure with anasarca and AKI.   Assessment & Plan    # Acute on chronic diastolic heart failure: # Hypertension; # AKI: LVEF was 40 to 45% in 2018 at the time of her NSTEMI.  It is since recovered and was 60 to 65% this admission.  She was admitted with anasarca and BNP 1198.  She also had pleural effusions.  Diuresis has been complicated by renal dysfunction. Renal function continues to improve.  She was net -2.1L yesterday.  She responded well to a dose of metolazone yesterday.  Continue IV lasix today.  At home she was on torsemide '40mg'$  po daily.  Resume home Jardiance as renal function improves. Resume home valsartan as renal function allows.  She had intolerance to [facility].  Start spironolactone today.  # CAD s/p RCA PCI:  # Hyperlipidemia: NSTEMI in 2018.  She has multivessel disease but was no a candidate for CABG.  Continue with medical management (Plavix, atorvastatin and metoprolol).  She has no angina.     # Persistent atrial fibrillation:  She remains in atrial fibrillation.  Rates are controlled on metoprolol.  Continue Eliquis.      For questions or updates, please contact [place] Please consult [email or web address] for contact info under        Signed, Skeet Latch, MD  10/24/2022, 8:31 AM

## 2022-10-24 NOTE — Plan of Care (Signed)
  Problem: Activity: Goal: Capacity to carry out activities will improve Outcome: Progressing   Problem: Education: Goal: Knowledge of General Education information will improve Description: Including pain rating scale, medication(s)/side effects and non-pharmacologic comfort measures Outcome: Progressing

## 2022-10-25 DIAGNOSIS — J439 Emphysema, unspecified: Secondary | ICD-10-CM

## 2022-10-25 DIAGNOSIS — J449 Chronic obstructive pulmonary disease, unspecified: Secondary | ICD-10-CM | POA: Diagnosis present

## 2022-10-25 LAB — BASIC METABOLIC PANEL
Anion gap: 10 (ref 5–15)
BUN: 65 mg/dL — ABNORMAL HIGH (ref 8–23)
CO2: 32 mmol/L (ref 22–32)
Calcium: 7.7 mg/dL — ABNORMAL LOW (ref 8.9–10.3)
Chloride: 101 mmol/L (ref 98–111)
Creatinine, Ser: 1.88 mg/dL — ABNORMAL HIGH (ref 0.44–1.00)
GFR, Estimated: 27 mL/min — ABNORMAL LOW (ref >60)
Glucose, Bld: 90 mg/dL (ref 70–99)
Potassium: 3.9 mmol/L (ref 3.5–5.1)
Sodium: 143 mmol/L (ref 135–145)

## 2022-10-25 LAB — CBC
HCT: 27.8 % — ABNORMAL LOW (ref 36.0–46.0)
Hemoglobin: 8.9 g/dL — ABNORMAL LOW (ref 12.0–15.0)
MCH: 30.6 pg (ref 26.0–34.0)
MCHC: 32 g/dL (ref 30.0–36.0)
MCV: 95.5 fL (ref 80.0–100.0)
Platelets: 90 10*3/uL — ABNORMAL LOW (ref 150–400)
RBC: 2.91 MIL/uL — ABNORMAL LOW (ref 3.87–5.11)
RDW: 15.3 % (ref 11.5–15.5)
WBC: 3.9 10*3/uL — ABNORMAL LOW (ref 4.0–10.5)
nRBC: 0 % (ref 0.0–0.2)

## 2022-10-25 LAB — GLUCOSE, CAPILLARY
Glucose-Capillary: 124 mg/dL — ABNORMAL HIGH (ref 70–99)
Glucose-Capillary: 295 mg/dL — ABNORMAL HIGH (ref 70–99)
Glucose-Capillary: 222 mg/dL — ABNORMAL HIGH (ref 70–99)
Glucose-Capillary: 156 mg/dL — ABNORMAL HIGH (ref 70–99)

## 2022-10-25 MED ORDER — HYDROCORTISONE 1 % EX CREA
1.0000 | TOPICAL_CREAM | Freq: Three times a day (TID) | CUTANEOUS | Status: DC | PRN
  Filled 2022-10-25: qty 28

## 2022-10-25 NOTE — Progress Notes (Addendum)
Progress Note   Patient: [name] PRF:[id number] DOB: [birth year] DOA: 10/16/2022     9 DOS: the patient was seen and examined on 10/25/2022   Brief hospital course: [name] was admitted to the hospital with the working diagnosis of heart failure decompensation.   81  yo female with the past medical history of AML, atrial fibrillation, COPD, coronary artery disease, T2DM, hypertension and heart failure who presented with edema. Patient had a recent hip fracture on 10/02/22 and had therapy at inpatient rehab. She noted worsening edema and decreased mobility, associated with dyspnea. On her initial physical examination her blood pressure was 129/52, HR 68, RR 22 and 02 saturation 93%, lungs with no wheezing or rhonchi, no rales, heart with S1 and S2 present and rhythmic, abdomen with no distention, positive lower extremity edema.   Na 140, K 4,4 Cl 111, bicarbonate 19, glucose 90 bun 78 cr 3,26  BNP 1,198 High sensitive troponin 10 and 12  Wbc 4,7 hgb 9,2 plt 136   Chest radiograph with cardiomegaly, with bilateral pleural effusions, more right than left, with bilateral hilar vascular congestion. Positive port on SVC RA junction.   EKG 66 bpm, normal axis, qtc with no prolongation, atrial fibrillation rhythm with no significant ST segment or T wave changes, low voltage.  Patient was placed on furosemide for diuresis.  Echocardiogram with preserved LV systolic function.   11/17 her volume status is improving with diuresis.  11/18 volume status continue to improve, possible dc home tomorrow   Assessment and Plan: * Acute on chronic diastolic (congestive) heart failure (HCC) Echocardiogram with preserved LV systolic function with EF 60 to 65%, mild LVH. RV systolic function is preserved. Moderate dilatation LA, moderate to severe TR, RVSP 40,9 mmHg. Trivial pericardial effusion. Mild to moderate aortic stenosis.   Urine output is 3,500 ml over last 24 hrs Systolic blood pressure 935  to 150 mmHg.  Had one dose of metolazone Continue with metoprolol. Hold on spironolactone due to worsening serum cr  Limited pharmacologic options due to reduced GFR.    Acute on chronic hypoxemic respiratory failure due to acute cardiogenic pulmonary edema Clinically improving with diuresis.   AKI (acute kidney injury) (HCC) CKD stage 4.   Renal function with serum cr at 1,8 with K at 3,9 and serum bicarbonate at 32. Possible transition to oral diuretic therapy.  Follow up renal function in am Holding on spironolactone    Essential hypertension Continue blood pressure control with metoprolol  Close blood pressure monitoring   CAD (coronary artery disease) No chest pain, continue with antiplatelet therapy/   Persistent atrial fibrillation (HCC) Continue rate control with metoprolol and continue anticoagulation with apixaban.   Type 2 diabetes mellitus with hyperlipidemia (HCC) Continue glucose cover and monitoring with insulin sliding scale Basal insulin 8 units Fasting glucose this am 90 mg/dl.   Continue statin therapy with atorvastatin   Seizure ([place]) No active seizures  Continue with keppra and as needed lorazepam.   Chronic pain disorder Continue with analgesics Patient very weak and debilitated, at home uses walker and wheelchair for mobility. Poor prognosis.   COPD (chronic obstructive pulmonary disease) (HCC) No clinical signs of exacerbation Patient has chronic hypoxemic respiratory failure with supplemental 02 at home Continue bronchodilator therapy.   DNR (do not resuscitate) DNR         Subjective: Patient with improvement in dyspnea, she has been able to ambulate with walker and supplemental 02 per [facility]   Physical Exam: Vitals:  10/24/22 2037 10/25/22 0342 10/25/22 0759 10/25/22 0826  BP:  (!) 143/70 (!) 155/53 120/62  Pulse:  85 (!) 58 70  Resp: 18 (!) 22  17  Temp:  97.6 F (36.4 C)    TempSrc:  Oral    SpO2:  92% 93% 93%  Weight:   67.4 kg    Height:       Neurology awake and alert ENT with mild pallor Cardiovascular with S1 and S2 present with no gallops, rubs or murmurs No JVD Trace lower extremity edema Respiratory with scattered rhonchi, with no rales or wheezing Abdomen with no distention  Data Reviewed:    Family Communication: no family at the bedside. I spoke with patient's daughter over the phone we talked in detail about patient's condition, plan of care and prognosis and all questions were addressed.  Disposition: Status is: Inpatient Remains inpatient appropriate because: heart failure   Planned Discharge Destination: Home      Author: Tawni Millers, MD 10/25/2022 10:13 AM  For on call review [email or web address].

## 2022-10-25 NOTE — Assessment & Plan Note (Signed)
No clinical signs of exacerbation Patient has chronic hypoxemic respiratory failure with supplemental 02 at home Continue bronchodilator therapy.

## 2022-10-25 NOTE — Progress Notes (Addendum)
Rounding Note    Patient Name: [name] Date of Encounter: 10/25/2022  [place] Cardiologist: Catherine Moores, MD   Subjective   SOB improved. No CP.   Inpatient Medications    Scheduled Meds:  apixaban  2.5 mg Oral BID   atorvastatin  40 mg Oral Daily   Chlorhexidine Gluconate Cloth  6 each Topical Daily   clopidogrel  75 mg Oral QPM   docusate sodium  100 mg Oral BID   furosemide  80 mg Intravenous BID   insulin aspart  0-9 Units Subcutaneous TID WC   insulin detemir  8 Units Subcutaneous Daily   letrozole  2.5 mg Oral Daily   levETIRAcetam  500 mg Oral BID   melatonin  3 mg Oral QHS   metoprolol succinate  12.5 mg Oral Daily   polyethylene glycol  17 g Oral Daily   psyllium  1 packet Oral Daily   sodium chloride flush  3 mL Intravenous Q12H   spironolactone  25 mg Oral Daily   Continuous Infusions:  PRN Meds: acetaminophen **OR** acetaminophen, alum & mag hydroxide-simeth, bisacodyl, fluticasone furoate-vilanterol, ipratropium-albuterol, LORazepam, methocarbamol, ondansetron **OR** ondansetron (ZOFRAN) IV, oxyCODONE, traZODone   Vital Signs    Vitals:   10/24/22 2037 10/25/22 0342 10/25/22 0759 10/25/22 0826  BP:  (!) 143/70 (!) 155/53 120/62  Pulse:  85 (!) 58 70  Resp: 18 (!) 22  17  Temp:  97.6 F (36.4 C)    TempSrc:  Oral    SpO2:  92% 93% 93%  Weight:  67.4 kg    Height:        Intake/Output Summary (Last 24 hours) at 10/25/2022 1000 Last data filed at 10/25/2022 0826 Gross per 24 hour  Intake 1143 ml  Output 3500 ml  Net -2357 ml       10/25/2022    3:42 AM 10/24/2022    3:18 AM 10/23/2022    3:57 AM  Last 3 Weights  Weight (lbs) 148 lb 9.4 oz 155 lb 6.8 oz 158 lb 4.6 oz  Weight (kg) 67.4 kg 70.5 kg 71.8 kg      Telemetry    Atrial fibrillation with CVR.- Personally Reviewed  ECG    N/a - Personally Reviewed  Physical Exam   VS:  BP 120/62 (BP Location: Right Arm)   Pulse 70   Temp 97.6 F (36.4 C)  (Oral)   Resp 17   Ht '5\' 2"'$  (1.575 m)   Wt 67.4 kg   SpO2 93%   BMI 27.18 kg/m  , BMI Body mass index is 27.18 kg/m. GEN: Well nourished, well developed in no acute distress HEENT: Normal NECK: No JVD; No carotid bruits LYMPHATICS: No lymphadenopathy CARDIAC:irregularly irregular  no murmurs, rubs, gallops RESPIRATORY:  Clear to auscultation without rales, wheezing or rhonchi  ABDOMEN: Soft, non-tender, non-distended MUSCULOSKELETAL:  trace LE edema; No deformity  SKIN: Warm and dry NEUROLOGIC:  Alert and oriented x 3 PSYCHIATRIC:  Normal affect  Labs    High Sensitivity Troponin:   Recent Labs  Lab 10/16/22 0342 10/16/22 0510  TROPONINIHS 10 12      Chemistry Recent Labs  Lab 10/19/22 0530 10/20/22 0540 10/21/22 0500 10/22/22 0354 10/23/22 0412 10/24/22 0330 10/25/22 0335  NA 145   < >  --  142 144 143 143  K 4.5   < >  --  3.7 3.9 3.7 3.9  CL 114*   < >  --  109 109 103  101  CO2 21*   < >  --  '25 27 29 '$ 32  GLUCOSE 114*   < >  --  125* 124* 102* 90  BUN 79*   < >  --  72* 74* 69* 65*  CREATININE 2.53*   < >  --  1.93* 1.82* 1.67* 1.88*  CALCIUM 6.7*   < >  --  6.8* 7.3* 7.6* 7.7*  MG  --    < > 1.7 2.1  --  1.8  --   PROT 5.5*  --   --   --   --   --   --   ALBUMIN 3.0*  --   --   --   --   --   --   AST 13*  --   --   --   --   --   --   ALT 9  --   --   --   --   --   --   ALKPHOS 100  --   --   --   --   --   --   BILITOT 0.6  --   --   --   --   --   --   GFRNONAA 19*   < >  --  26* 28* 31* 27*  ANIONGAP 10   < >  --  '8 8 11 10   '$ < > = values in this interval not displayed.     Lipids No results for input(s): "CHOL", "TRIG", "HDL", "LABVLDL", "LDLCALC", "CHOLHDL" in the last 168 hours.  Hematology Recent Labs  Lab 10/19/22 0530 10/22/22 0354 10/25/22 0335  WBC 6.1 4.2 3.9*  RBC 2.98* 2.72* 2.91*  HGB 9.4* 8.6* 8.9*  HCT 29.3* 26.1* 27.8*  MCV 98.3 96.0 95.5  MCH 31.5 31.6 30.6  MCHC 32.1 33.0 32.0  RDW 16.2* 15.7* 15.3  PLT 115* 93* 90*     Thyroid No results for input(s): "TSH", "FREET4" in the last 168 hours.  BNP No results for input(s): "BNP", "PROBNP" in the last 168 hours.   DDimer No results for input(s): "DDIMER" in the last 168 hours.   Radiology    No results found.  Cardiac Studies   Echo 10/16/2022:   1. Left ventricular ejection fraction, by estimation, is 60 to 65%. The  left ventricle has normal function. The left ventricle has no regional  wall motion abnormalities. There is mild left ventricular hypertrophy.  Left ventricular diastolic parameters  are indeterminate.   2. Right ventricular systolic function is normal. The right ventricular  size is normal. There is mildly elevated pulmonary artery systolic  pressure.   3. Left atrial size was moderately dilated.   4. The mitral valve is degenerative. Mild mitral valve regurgitation. No  evidence of mitral stenosis. Severe mitral annular calcification.   5. Tricuspid valve regurgitation is moderate to severe.   6. The aortic valve is normal in structure. There is severe calcifcation  of the aortic valve. There is severe thickening of the aortic valve.  Aortic valve regurgitation is not visualized. Mild to moderate aortic  valve stenosis.   7. The inferior vena cava is normal in size with greater than 50%  respiratory variability, suggesting right atrial pressure of 3 mmHg.   Patient Profile     [name] is an 81F with CAD s/p NSTEMI and RCA PCI, HFmrEF and recovered LV function, persistent atrial fibrillation, mild-moderate aortic stenosis, hypertension, hyperlipidemia, DM, COPD,  breast cancer, AML, and cirrhosis admitted with acute on chronic diastolic heart failure with anasarca and AKI.   Assessment & Plan    # Acute on chronic diastolic heart failure: # Hypertension; # AKI: LVEF was 40 to 45% in 2018 at the time of her NSTEMI.  It is since recovered and was 60 to 65% this admission.  - She was admitted with anasarca and BNP 1198.  She  also had pleural effusions.   -Diuresis has been complicated by renal dysfunction. Renal function continues to improve.   -She was net -3.5 L yesterday and is net -10.7 L since admission  -She received metolazone this admission and serum creatinine has bumped from 1.67->>1.88 today -She was on torsemide 40 mg daily at home -Vania Rea is currently on hold due to AKI on CKD  -She has intolerance to [place]. -Spironolactone was started yesterday but she had a bump in her serum creatinine so we will stop spironolactone at this time -? How much of a role low albumin is playing.  Lungs are clear and only trace LE edema>>continue TED hose -she was 73kg when she say Dr. Acie Fredrickson in June 23 and today 67kg -stop diuretics to allow normalization of SCr -Repeat bmet in a.m.  # CAD s/p RCA PCI:  # Hyperlipidemia: NSTEMI in 2018.  She has multivessel disease but was no a candidate for CABG.   -Continue atorvastatin 40 mg daily, Plavix 75 mg daily and Toprol-XL 12.5 mg daily.  No aspirin due to DOAC therapy    # Persistent atrial fibrillation:  She remains in atrial fibrillation.   -Heart rate well controlled in atrial fibrillation  -Continue Toprol-XL 12.5 mg daily as well as apixaban 2.5 mg twice daily dosed for serum creatinine greater than 1.5 and age 58   I have spent a total of 35 minutes with patient reviewing 2D echo , telemetry, EKGs, labs and examining patient as well as establishing an assessment and plan that was discussed with the patient.  > 50% of time was spent in direct patient care.       For questions or updates, please contact [place] Please consult [email or web address] for contact info under        Signed, Fransico Him, MD  10/25/2022, 10:00 AM

## 2022-10-25 NOTE — Progress Notes (Signed)
   10/25/22 1047  Mobility  Activity Ambulated with assistance in hallway  Level of Assistance Standby assist, set-up cues, supervision of patient - no hands on  Assistive Device Four wheel walker  Distance Ambulated (ft) 550 ft  Activity Response Tolerated well  Mobility Referral Yes  $Mobility charge 1 Mobility   Mobility Specialist Progress Note  Received pt in chair having no complaints and agreeable to mobility. Had no c/o pain throughout ambulation.  Left in chair w/ call bell in reach and all needs met.   Lucious Groves Mobility Specialist

## 2022-10-26 ENCOUNTER — Inpatient Hospital Stay (HOSPITAL_COMMUNITY): Payer: Medicare HMO

## 2022-10-26 DIAGNOSIS — E1169 Type 2 diabetes mellitus with other specified complication: Secondary | ICD-10-CM

## 2022-10-26 DIAGNOSIS — E785 Hyperlipidemia, unspecified: Secondary | ICD-10-CM

## 2022-10-26 LAB — BASIC METABOLIC PANEL
Anion gap: 10 (ref 5–15)
BUN: 63 mg/dL — ABNORMAL HIGH (ref 8–23)
CO2: 34 mmol/L — ABNORMAL HIGH (ref 22–32)
Calcium: 7.7 mg/dL — ABNORMAL LOW (ref 8.9–10.3)
Chloride: 97 mmol/L — ABNORMAL LOW (ref 98–111)
Creatinine, Ser: 1.91 mg/dL — ABNORMAL HIGH (ref 0.44–1.00)
GFR, Estimated: 26 mL/min — ABNORMAL LOW (ref >60)
Glucose, Bld: 141 mg/dL — ABNORMAL HIGH (ref 70–99)
Potassium: 4.4 mmol/L (ref 3.5–5.1)
Sodium: 141 mmol/L (ref 135–145)

## 2022-10-26 LAB — GLUCOSE, CAPILLARY
Glucose-Capillary: 215 mg/dL — ABNORMAL HIGH (ref 70–99)
Glucose-Capillary: 316 mg/dL — ABNORMAL HIGH (ref 70–99)
Glucose-Capillary: 86 mg/dL (ref 70–99)
Glucose-Capillary: 177 mg/dL — ABNORMAL HIGH (ref 70–99)

## 2022-10-26 LAB — BRAIN NATRIURETIC PEPTIDE: B Natriuretic Peptide: 791.8 pg/mL — ABNORMAL HIGH (ref 0.0–100.0)

## 2022-10-26 MED ORDER — ALTEPLASE 2 MG IJ SOLR
2.0000 mg | Freq: Once | INTRAMUSCULAR | Status: AC
  Administered 2022-10-26: 2 mg
  Filled 2022-10-26: qty 2

## 2022-10-26 MED ORDER — FUROSEMIDE 10 MG/ML IJ SOLN
60.0000 mg | Freq: Two times a day (BID) | INTRAMUSCULAR | Status: DC
  Administered 2022-10-26 (×2): 60 mg via INTRAVENOUS
  Filled 2022-10-26 (×2): qty 6

## 2022-10-26 NOTE — Progress Notes (Signed)
At bedside for IVT consult. Upon assessment, dual lumen PAC noted to be accessed. Flushed with some resistance and scant blood return noted. HPN appearing to be positioned in the side of the septum. Deaccessed and reaccessed per protocol. Outcome unchanged. Able to flush and only scant blood return noted. TPA instilled at this time.

## 2022-10-26 NOTE — Progress Notes (Signed)
Progress Note   Patient: [name] TOI:[id number] DOB: 1941/04/02 DOA: 10/16/2022     10 DOS: the patient was seen and examined on 10/26/2022   Brief hospital course: Mrs. Prest was admitted to the hospital with the working diagnosis of heart failure decompensation.   81  yo female with the past medical history of AML, atrial fibrillation, COPD, coronary artery disease, T2DM, hypertension and heart failure who presented with edema. Patient had a recent hip fracture on 10/02/22 and had therapy at inpatient rehab. She noted worsening edema and decreased mobility, associated with dyspnea. On her initial physical examination her blood pressure was 129/52, HR 68, RR 22 and 02 saturation 93%, lungs with no wheezing or rhonchi, no rales, heart with S1 and S2 present and rhythmic, abdomen with no distention, positive lower extremity edema.   Na 140, K 4,4 Cl 111, bicarbonate 19, glucose 90 bun 78 cr 3,26  BNP 1,198 High sensitive troponin 10 and 12  Wbc 4,7 hgb 9,2 plt 136   Chest radiograph with cardiomegaly, with bilateral pleural effusions, more right than left, with bilateral hilar vascular congestion. Positive port on SVC RA junction.   EKG 66 bpm, normal axis, qtc with no prolongation, atrial fibrillation rhythm with no significant ST segment or T wave changes, low voltage.  Patient was placed on furosemide for diuresis.  Echocardiogram with preserved LV systolic function.   11/17 her volume status is improving with diuresis.  11/18 volume status continue to improve 11/19 follow up chest radiograph with persistent right pleural effusion, will request US guided thoracentesis.   Assessment and Plan: * Acute on chronic diastolic (congestive) heart failure (HCC) Echocardiogram with preserved LV systolic function with EF 60 to 65%, mild LVH. RV systolic function is preserved. Moderate dilatation LA, moderate to severe TR, RVSP 40,9 mmHg. Trivial pericardial effusion. Mild to moderate aortic  stenosis.   Urine output is 1.500  ml over last 24 hrs Systolic blood pressure 833 to 133 mmHg.  Ap one time dose of metolazone Continue with metoprolol. Hold on spironolactone due to worsening serum cr  Limited pharmacologic options due to reduced GFR.  Plan to resume diuresis with furosemide IV today.    Acute on chronic hypoxemic respiratory failure due to acute cardiogenic pulmonary edema, right pleural effusion.  Plan to request thoracentesis to the right pleural effusion.  Continue oxymetry monitoring and supplemental 02 per [place]  AKI (acute kidney injury) ([place]) CKD stage 4.   Patient continue volume overloaded with pulmonary edema.  Renal function with serum cr at 1.91 with K at 4,4 and serum bicarbonate at 34.  Resume diuresis with furosemide IV 60 mg q12 hrs.  Essential hypertension Continue blood pressure control with metoprolol  Close blood pressure monitoring   CAD (coronary artery disease) No chest pain, continue with antiplatelet therapy/   Persistent atrial fibrillation (HCC) Continue rate control with metoprolol and continue anticoagulation with apixaban.   Type 2 diabetes mellitus with hyperlipidemia (HCC) Continue glucose cover and monitoring with insulin sliding scale Fasting glucose is 141 mg/dl.  Basal insulin 8 units  Continue statin therapy with atorvastatin   Seizure ([place]) No active seizures  Continue with keppra and as needed lorazepam.   Chronic pain disorder Continue with analgesics Patient very weak and debilitated, at home uses walker and wheelchair for mobility. Poor prognosis.   COPD (chronic obstructive pulmonary disease) (HCC) No clinical signs of exacerbation Patient has chronic hypoxemic respiratory failure with supplemental 02 at home Continue bronchodilator therapy.  DNR (do not resuscitate) DNR         Subjective: Patient continue to have dyspnea, better but not back to her baseline her edema has improved.   Physical  Exam: Vitals:   10/26/22 0300 10/26/22 0448 10/26/22 0832 10/26/22 1122  BP:  (!) 119/55 102/60 (!) 133/56  Pulse:  (!) 58  69  Resp:  '20 20 19  '$ Temp:  98.1 F (36.7 C)  97.9 F (36.6 C)  TempSrc:  Oral  Oral  SpO2:  91% 100% 90%  Weight: 63.1 kg     Height:       Neurology awake and alert ENT with mild pallor Cardiovascular with S1 and S2 present, irregularly irregular with no gallops, rubs or murmurs Moderate JVD Trace lower extremity edema Respiratory with rales bilaterally with no wheezing Abdomen not distended  Data Reviewed:    Family Communication: no family at the bedside   Disposition: Status is: Inpatient Remains inpatient appropriate because: heart failure and IV diuresis   Planned Discharge Destination: Home      Author: Tawni Millers, MD 10/26/2022 1:01 PM  For on call review [email or web address].

## 2022-10-26 NOTE — Progress Notes (Signed)
Rounding Note    Patient Name: [name] Date of Encounter: 10/26/2022  [place] Cardiologist: Mertie Moores, MD   Subjective   SOB improved. No CP.   Inpatient Medications    Scheduled Meds:  apixaban  2.5 mg Oral BID   atorvastatin  40 mg Oral Daily   Chlorhexidine Gluconate Cloth  6 each Topical Daily   clopidogrel  75 mg Oral QPM   docusate sodium  100 mg Oral BID   insulin aspart  0-9 Units Subcutaneous TID WC   insulin detemir  8 Units Subcutaneous Daily   letrozole  2.5 mg Oral Daily   levETIRAcetam  500 mg Oral BID   melatonin  3 mg Oral QHS   metoprolol succinate  12.5 mg Oral Daily   polyethylene glycol  17 g Oral Daily   psyllium  1 packet Oral Daily   sodium chloride flush  3 mL Intravenous Q12H   Continuous Infusions:  PRN Meds: acetaminophen **OR** acetaminophen, alum & mag hydroxide-simeth, bisacodyl, fluticasone furoate-vilanterol, hydrocortisone cream, ipratropium-albuterol, LORazepam, methocarbamol, ondansetron **OR** ondansetron (ZOFRAN) IV, oxyCODONE, traZODone   Vital Signs    Vitals:   10/25/22 1946 10/26/22 0300 10/26/22 0448 10/26/22 0832  BP: (!) 159/68  (!) 119/55 102/60  Pulse: 79  (!) 58   Resp: '20  20 20  '$ Temp: 98 F (36.7 C)  98.1 F (36.7 C)   TempSrc: Oral  Oral   SpO2: 92%  91% 100%  Weight:  63.1 kg    Height:        Intake/Output Summary (Last 24 hours) at 10/26/2022 0920 Last data filed at 10/26/2022 0819 Gross per 24 hour  Intake 1200 ml  Output 1750 ml  Net -550 ml       10/26/2022    3:00 AM 10/25/2022    3:42 AM 10/24/2022    3:18 AM  Last 3 Weights  Weight (lbs) 139 lb 1.8 oz 148 lb 9.4 oz 155 lb 6.8 oz  Weight (kg) 63.1 kg 67.4 kg 70.5 kg      Telemetry    Atrial fibrillation with CVR.- Personally Reviewed  ECG    N/a - Personally Reviewed  Physical Exam   VS:  BP 102/60   Pulse (!) 58   Temp 98.1 F (36.7 C) (Oral)   Resp 20   Ht '5\' 2"'$  (1.575 m)   Wt 63.1 kg   SpO2  100%   BMI 25.44 kg/m  , BMI Body mass index is 25.44 kg/m. GEN: Well nourished, well developed in no acute distress HEENT: Normal NECK: No JVD; No carotid bruits LYMPHATICS: No lymphadenopathy CARDIAC:irregularly irregular, no murmurs, rubs, gallops RESPIRATORY:  crackles at bases ABDOMEN: Soft, non-tender, non-distended MUSCULOSKELETAL:  No edema; No deformity  SKIN: Warm and dry NEUROLOGIC:  Alert and oriented x 3 PSYCHIATRIC:  Normal affect  Labs    High Sensitivity Troponin:   Recent Labs  Lab 10/16/22 0342 10/16/22 0510  TROPONINIHS 10 12      Chemistry Recent Labs  Lab 10/21/22 0500 10/22/22 0354 10/23/22 0412 10/24/22 0330 10/25/22 0335 10/26/22 0215  NA  --  142   < > 143 143 141  K  --  3.7   < > 3.7 3.9 4.4  CL  --  109   < > 103 101 97*  CO2  --  25   < > 29 32 34*  GLUCOSE  --  125*   < > 102* 90 141*  BUN  --  72*   < > 69* 65* 63*  CREATININE  --  1.93*   < > 1.67* 1.88* 1.91*  CALCIUM  --  6.8*   < > 7.6* 7.7* 7.7*  MG 1.7 2.1  --  1.8  --   --   GFRNONAA  --  26*   < > 31* 27* 26*  ANIONGAP  --  8   < > '11 10 10   '$ < > = values in this interval not displayed.     Lipids No results for input(s): "CHOL", "TRIG", "HDL", "LABVLDL", "LDLCALC", "CHOLHDL" in the last 168 hours.  Hematology Recent Labs  Lab 10/22/22 0354 10/25/22 0335  WBC 4.2 3.9*  RBC 2.72* 2.91*  HGB 8.6* 8.9*  HCT 26.1* 27.8*  MCV 96.0 95.5  MCH 31.6 30.6  MCHC 33.0 32.0  RDW 15.7* 15.3  PLT 93* 90*    Thyroid No results for input(s): "TSH", "FREET4" in the last 168 hours.  BNP No results for input(s): "BNP", "PROBNP" in the last 168 hours.   DDimer No results for input(s): "DDIMER" in the last 168 hours.   Radiology    No results found.  Cardiac Studies   Echo 10/16/2022:   1. Left ventricular ejection fraction, by estimation, is 60 to 65%. The  left ventricle has normal function. The left ventricle has no regional  wall motion abnormalities. There is mild  left ventricular hypertrophy.  Left ventricular diastolic parameters  are indeterminate.   2. Right ventricular systolic function is normal. The right ventricular  size is normal. There is mildly elevated pulmonary artery systolic  pressure.   3. Left atrial size was moderately dilated.   4. The mitral valve is degenerative. Mild mitral valve regurgitation. No  evidence of mitral stenosis. Severe mitral annular calcification.   5. Tricuspid valve regurgitation is moderate to severe.   6. The aortic valve is normal in structure. There is severe calcifcation  of the aortic valve. There is severe thickening of the aortic valve.  Aortic valve regurgitation is not visualized. Mild to moderate aortic  valve stenosis.   7. The inferior vena cava is normal in size with greater than 50%  respiratory variability, suggesting right atrial pressure of 3 mmHg.   Patient Profile     [name] is an 81F with CAD s/p NSTEMI and RCA PCI, HFmrEF and recovered LV function, persistent atrial fibrillation, mild-moderate aortic stenosis, hypertension, hyperlipidemia, DM, COPD, breast cancer, AML, and cirrhosis admitted with acute on chronic diastolic heart failure with anasarca and AKI.   Assessment & Plan    # Acute on chronic diastolic heart failure: # Hypertension; # AKI: LVEF was 40 to 45% in 2018 at the time of her NSTEMI.  It is since recovered and was 60 to 65% this admission.  -She was admitted with anasarca and BNP 1198.  She also had pleural effusions.   -Diuresis has been complicated by renal dysfunction. Renal function continues to improve.   -She was net -1.5 L yesterday and is net -11.3 L since admission  -She received metolazone this admission and also started on Spiro 11/17 and serum creatinine bumped from 1.67->>1.88 yesterday.   -Diuretics were held yesterday and SCr went from 1.88>>1.91 today -She was on torsemide 40 mg daily at home -[name] is currently on hold due to AKI on CKD   -She has intolerance to [place]. -? How much of a role low albumin is playing.   -she was  73kg when she say Dr. Acie Fredrickson in June 23 and yesterday 67kg and now 63kg -exam difficult in that she also has COPD so unclear if crackles on exam related to COPD or edema -will check BNP today and Cxray today>>if good then I think she is fine to discharge home with early followup next week in [facility] HF clinic to reassess volume -continue to keep off diuretics for now -she wants to go home  # CAD s/p RCA PCI:  # Hyperlipidemia: NSTEMI in 2018.  She has multivessel disease but was no a candidate for CABG.   -Denies any chest pain or pressure -continue Atorvastatin '40mg'$  daily, Plavix '75mg'$  daily and Toprol XL 12.'5mg'$  daily  -No aspirin due to [place] therapy    # Persistent atrial fibrillation:  -She remains in atrial fibrillation.   -HR is adequately controlled -continue Toprol XL 12.'5mg'$  daily and Apixaban 2.'5mg'$  BID (Dosed for age 78 and SCr>1.5)  I have spent a total of 35 minutes with patient reviewing 2D echo , telemetry, EKGs, labs and examining patient as well as establishing an assessment and plan that was discussed with the patient.  > 50% of time was spent in direct patient care.       For questions or updates, please contact [place] Please consult [email or web address] for contact info under        Signed, Fransico Him, MD  10/26/2022, 9:20 AM

## 2022-10-27 ENCOUNTER — Inpatient Hospital Stay (HOSPITAL_COMMUNITY): Payer: Medicare HMO

## 2022-10-27 DIAGNOSIS — J90 Pleural effusion, not elsewhere classified: Secondary | ICD-10-CM

## 2022-10-27 DIAGNOSIS — I5043 Acute on chronic combined systolic (congestive) and diastolic (congestive) heart failure: Secondary | ICD-10-CM

## 2022-10-27 DIAGNOSIS — N179 Acute kidney failure, unspecified: Secondary | ICD-10-CM

## 2022-10-27 HISTORY — PX: IR THORACENTESIS ASP PLEURAL SPACE W/IMG GUIDE: IMG5380

## 2022-10-27 LAB — BASIC METABOLIC PANEL
Anion gap: 9 (ref 5–15)
BUN: 64 mg/dL — ABNORMAL HIGH (ref 8–23)
CO2: 33 mmol/L — ABNORMAL HIGH (ref 22–32)
Calcium: 7.8 mg/dL — ABNORMAL LOW (ref 8.9–10.3)
Chloride: 100 mmol/L (ref 98–111)
Creatinine, Ser: 2.36 mg/dL — ABNORMAL HIGH (ref 0.44–1.00)
GFR, Estimated: 20 mL/min — ABNORMAL LOW (ref >60)
Glucose, Bld: 189 mg/dL — ABNORMAL HIGH (ref 70–99)
Potassium: 4.4 mmol/L (ref 3.5–5.1)
Sodium: 142 mmol/L (ref 135–145)

## 2022-10-27 LAB — GLUCOSE, CAPILLARY
Glucose-Capillary: 166 mg/dL — ABNORMAL HIGH (ref 70–99)
Glucose-Capillary: 285 mg/dL — ABNORMAL HIGH (ref 70–99)
Glucose-Capillary: 229 mg/dL — ABNORMAL HIGH (ref 70–99)
Glucose-Capillary: 108 mg/dL — ABNORMAL HIGH (ref 70–99)

## 2022-10-27 LAB — GRAM STAIN: Gram Stain: NONE SEEN

## 2022-10-27 LAB — LACTATE DEHYDROGENASE, PLEURAL OR PERITONEAL FLUID: LD, Fluid: 52 U/L — ABNORMAL HIGH (ref 3–23)

## 2022-10-27 LAB — BODY FLUID CELL COUNT WITH DIFFERENTIAL
Eos, Fluid: 0 %
Lymphs, Fluid: 31 %
Monocyte-Macrophage-Serous Fluid: 63 % (ref 50–90)
Neutrophil Count, Fluid: 6 % (ref 0–25)
Total Nucleated Cell Count, Fluid: 98 cu mm (ref 0–1000)

## 2022-10-27 LAB — MAGNESIUM: Magnesium: 2 mg/dL (ref 1.7–2.4)

## 2022-10-27 LAB — GLUCOSE, PLEURAL OR PERITONEAL FLUID: Glucose, Fluid: 201 mg/dL

## 2022-10-27 LAB — PROTEIN, PLEURAL OR PERITONEAL FLUID: Total protein, fluid: <3 g/dL

## 2022-10-27 MED ORDER — LIDOCAINE HCL 1 % IJ SOLN
INTRAMUSCULAR | Status: AC
  Administered 2022-10-27: 10 mL
  Filled 2022-10-27: qty 20

## 2022-10-27 NOTE — Progress Notes (Signed)
Rounding Note    Patient Name: [name] Date of Encounter: 10/27/2022  [place] Cardiologist: Mertie Moores, MD   Subjective   Doing better.  Over the weekend walked to the nursing station and back. O2 sat down to 92% after ambulation to bathroom today, on room air. Net diuresis 13 L, weight down 30 pounds since admission. Creatinine has actually increased slightly today, baseline creatinine probably around 1.5-1.7 (eGFR 25-30).  Inpatient Medications    Scheduled Meds:  apixaban  2.5 mg Oral BID   atorvastatin  40 mg Oral Daily   Chlorhexidine Gluconate Cloth  6 each Topical Daily   clopidogrel  75 mg Oral QPM   docusate sodium  100 mg Oral BID   insulin aspart  0-9 Units Subcutaneous TID WC   insulin detemir  8 Units Subcutaneous Daily   letrozole  2.5 mg Oral Daily   levETIRAcetam  500 mg Oral BID   lidocaine       melatonin  3 mg Oral QHS   metoprolol succinate  12.5 mg Oral Daily   polyethylene glycol  17 g Oral Daily   psyllium  1 packet Oral Daily   sodium chloride flush  3 mL Intravenous Q12H   Continuous Infusions:  PRN Meds: acetaminophen **OR** acetaminophen, alum & mag hydroxide-simeth, bisacodyl, fluticasone furoate-vilanterol, hydrocortisone cream, ipratropium-albuterol, lidocaine, LORazepam, methocarbamol, ondansetron **OR** ondansetron (ZOFRAN) IV, oxyCODONE, traZODone   Vital Signs    Vitals:   10/26/22 1942 10/27/22 0121 10/27/22 0456 10/27/22 0835  BP: (!) 152/49  (!) 163/52 (!) 130/55  Pulse: 67  (!) 54 71  Resp: _0 Temp: (!) 97.3 F (36.3 C)  (!) 97.3 F (36.3 C)   TempSrc: Oral  Oral   SpO2: 99%  91% 92%  Weight:  65 kg    Height:        Intake/Output Summary (Last 24 hours) at 10/27/2022 1003 Last data filed at 10/27/2022 0826 Gross per 24 hour  Intake 420 ml  Output 1875 ml  Net -1455 ml      10/27/2022    1:21 AM 10/26/2022    3:00 AM 10/25/2022    3:42 AM  Last 3 Weights  Weight (lbs) 143 lb  4.8 oz 139 lb 1.8 oz 148 lb 9.4 oz  Weight (kg) 65 kg 63.1 kg 67.4 kg      Telemetry    Atrial fibrillation with controlled ventricular rate- Personally Reviewed  ECG    No new tracing- Personally Reviewed  Physical Exam  Appears comfortable. GEN: No acute distress.   Neck: No JVD Cardiac: Irregular, early peaking 2/6 aortic ejection murmur, no diastolic murmurs, rubs, or gallops.  Respiratory: egophony in the right base, otherwise clear to auscultation bilaterally. GI: Soft, nontender, non-distended  MS: No edema; No deformity. Neuro:  Nonfocal  Psych: Normal affect   Labs    High Sensitivity Troponin:   Recent Labs  Lab 10/16/22 0342 10/16/22 0510  TROPONINIHS 10 12     Chemistry Recent Labs  Lab 10/22/22 0354 10/23/22 0412 10/24/22 0330 10/25/22 0335 10/26/22 0215 10/27/22 0318  NA 142   < > 143 143 141 142  K 3.7   < > 3.7 3.9 4.4 4.4  CL 109   < > 103 101 97* 100  CO2 25   < > 29 32 34* 33*  GLUCOSE 125*   < > 102* 90 141* 189*  BUN 72*   < > 69* 65*  63* 64*  CREATININE 1.93*   < > 1.67* 1.88* 1.91* 2.36*  CALCIUM 6.8*   < > 7.6* 7.7* 7.7* 7.8*  MG 2.1  --  1.8  --   --  2.0  GFRNONAA 26*   < > 31* 27* 26* 20*  ANIONGAP 8   < > _0 < > = values in this interval not displayed.    Lipids No results for input(s): "CHOL", "TRIG", "HDL", "LABVLDL", "LDLCALC", "CHOLHDL" in the last 168 hours.  Hematology Recent Labs  Lab 10/22/22 0354 10/25/22 0335  WBC 4.2 3.9*  RBC 2.72* 2.91*  HGB 8.6* 8.9*  HCT 26.1* 27.8*  MCV 96.0 95.5  MCH 31.6 30.6  MCHC 33.0 32.0  RDW 15.7* 15.3  PLT 93* 90*   Thyroid No results for input(s): "TSH", "FREET4" in the last 168 hours.  BNP Recent Labs  Lab 10/26/22 0934  BNP 791.8*    DDimer No results for input(s): "DDIMER" in the last 168 hours.   Radiology    DG Chest 2 View  Result Date: 10/26/2022 CLINICAL DATA:  CHF EXAM: CHEST - 2 VIEW COMPARISON:  CXR 10/18/22 FINDINGS: Right-sided needle access  port in place with the tip near the cavoatrial junction. There are small bilateral pleural effusions. Compared to prior exam there is interval increase in hazy opacity at the right lung base, which could represent a combination of atelectasis and a layering pleural effusion. Visualized upper abdomen is unremarkable. Compared to prior exam there is slightly increased prominence of the right lower lobe lobar pulmonary artery. Cardiac and mediastinal contours are otherwise unchanged. No displaced rib fracture. No pneumothorax. Degenerative changes of the bilateral AC and glenohumeral joints. Vertebral body heights are maintained. IMPRESSION: 1.  Small bilateral pleural effusions, increased from prior. 2. Increased prominence of the right lower lobe lobar pulmonary artery, nonspecific. Electronically Signed   By: Marin Roberts M.D.   On: 10/26/2022 12:00    Cardiac Studies   Echo 10/16/2022:    1. Left ventricular ejection fraction, by estimation, is 60 to 65%. The  left ventricle has normal function. The left ventricle has no regional  wall motion abnormalities. There is mild left ventricular hypertrophy.  Left ventricular diastolic parameters  are indeterminate.   2. Right ventricular systolic function is normal. The right ventricular  size is normal. There is mildly elevated pulmonary artery systolic  pressure.   3. Left atrial size was moderately dilated.   4. The mitral valve is degenerative. Mild mitral valve regurgitation. No  evidence of mitral stenosis. Severe mitral annular calcification.   5. Tricuspid valve regurgitation is moderate to severe.   6. The aortic valve is normal in structure. There is severe calcifcation  of the aortic valve. There is severe thickening of the aortic valve.  Aortic valve regurgitation is not visualized. Mild to moderate aortic  valve stenosis.   7. The inferior vena cava is normal in size with greater than 50%  respiratory variability, suggesting right  atrial pressure of 3 mmHg.   Patient Profile     81 y.o. female with CAD s/p NSTEMI and RCA PCI, HFmrEF and recovered LV function, persistent atrial fibrillation, mild-moderate aortic stenosis, hypertension, hyperlipidemia, DM, COPD, breast cancer, AML, and cirrhosis admitted with acute on chronic diastolic heart failure with anasarca and AKI.   Assessment & Plan    Note plan for right thoracentesis today. May also benefit from incentive spirometer. Seems euvolemic.  Plan to resume oral  diuretics when renal parameters stabilize. Reviewed the importance of sodium reduction in diet and daily weight monitoring.  Call for adjustment in medications if she gains 3 pounds in 24 hours or 5 pounds in a week. Continue low-dose beta-blocker, atorvastatin, clopidogrel.  Eliquis dose adjusted for renal dysfunction. Resume loop diuretic and SGLT2 inhibitor once creatinine returns to baseline.     For questions or updates, please contact [place] Please consult [email or web address] for contact info under        Signed, Sanda Klein, MD  10/27/2022, 10:03 AM

## 2022-10-27 NOTE — Progress Notes (Signed)
Physical Therapy Treatment Patient Details Name: [name] MRN: [id number] DOB: Nov 01, 1941 Today's Date: 10/27/2022   History of Present Illness The pt is an 81 yo female presenting 11/9 with SOB and LE edema, found to have CHF exacerbation. Pt with recent admission for hip fx, d/c from inpatient rehab 10/26. PMH includes: arthritis, cardiomyopathy, CKD IV, CHF, COPD, CAD, DM II, HLD, HTN, and NSTEMI.    PT Comments    The pt was agreeable to session with focus on progressing mobility and ambulation on RA. She tolerated well and continues to demo good safety with use of rollator. Pt with no reports of SOB and SpO2 89-96% on RA with activity at this time. Pt demos good progress with endurance, will continue to work on endurance, stability, and intensity as pt currently ambulates with markedly slowed self-selected gait speed. Pt remains in agreement with plan, continue to recommend HHPT when ready for d/c.  Gait Speed: 0.69ms using rollator (Gait speed < 1.03m indicates increased risk of falls and <0.59m30mindicates increased risk of falls and dependence in ADLs)    Recommendations for follow up therapy are one component of a multi-disciplinary discharge planning process, led by the attending physician.  Recommendations may be updated based on patient status, additional functional criteria and insurance authorization.  Follow Up Recommendations  Home health PT     Assistance Recommended at Discharge Frequent or constant Supervision/Assistance  Patient can return home with the following A little help with walking and/or transfers;A little help with bathing/dressing/bathroom;Assistance with cooking/housework;Help with stairs or ramp for entrance   Equipment Recommendations  None recommended by PT    Recommendations for Other Services       Precautions / Restrictions Precautions Precautions: Fall Precaution Comments: monitor SpO2 to maintain >92%, hx of seizure disorder, no R LE ROM  restrictions Restrictions Weight Bearing Restrictions: No RLE Weight Bearing: Weight bearing as tolerated     Mobility  Bed Mobility Overal bed mobility: Needs Assistance             General bed mobility comments: pt sitting EOB at start and end of session    Transfers Overall transfer level: Needs assistance Equipment used: Rollator (4 wheels) Transfers: Sit to/from Stand Sit to Stand: Min guard           General transfer comment: minG to power up to standing, no overt LOB or need for assist. good control of rollator    Ambulation/Gait Ambulation/Gait assistance: Min guard Gait Distance (Feet): 75 Feet (+ 75 + 150 (total 300 with 2 standing rest breaks)) Assistive device: Rollator (4 wheels) Gait Pattern/deviations: Step-through pattern, Decreased stance time - right, Decreased stride length, Knee flexed in stance - right, Trunk flexed Gait velocity: 0.28 m/s Gait velocity interpretation: <1.31 ft/sec, indicative of household ambulator   General Gait Details: RLE in external rotation, less DF, progressivly decreased clarance with fatigue and slowed gait. no overt LOB. VSS on RA with gait      Balance Overall balance assessment: Needs assistance Sitting-balance support: No upper extremity supported, Feet supported Sitting balance-Leahy Scale: Fair     Standing balance support: Bilateral upper extremity supported, During functional activity, No upper extremity supported Standing balance-Leahy Scale: Fair Standing balance comment: can stand to comb hair at sink without UE support, Bil UE support for gait                            Cognition Arousal/Alertness: Awake/alert Behavior  During Therapy: WFL for tasks assessed/performed Overall Cognitive Status: Within Functional Limits for tasks assessed                                             General Comments General comments (skin integrity, edema, etc.): VSs on RA with gait, SpO2  89-96%      Pertinent Vitals/Pain Pain Assessment Pain Assessment: Faces Faces Pain Scale: No hurt Pain Intervention(s): Monitored during session     PT Goals (current goals can now be found in the care plan section) Acute Rehab PT Goals Patient Stated Goal: return home PT Goal Formulation: With patient Time For Goal Achievement: 10/31/22 Potential to Achieve Goals: Good Progress towards PT goals: Progressing toward goals    Frequency    Min 3X/week      PT Plan Current plan remains appropriate       AM-PAC PT "6 Clicks" Mobility   Outcome Measure  Help needed turning from your back to your side while in a flat bed without using bedrails?: A Little Help needed moving from lying on your back to sitting on the side of a flat bed without using bedrails?: A Little Help needed moving to and from a bed to a chair (including a wheelchair)?: A Little Help needed standing up from a chair using your arms (e.g., wheelchair or bedside chair)?: A Little Help needed to walk in hospital room?: A Little Help needed climbing 3-5 steps with a railing? : A Lot 6 Click Score: 17    End of Session Equipment Utilized During Treatment: Gait belt Activity Tolerance: Patient tolerated treatment well Patient left: with call bell/phone within reach;in bed (sitting EOB) Nurse Communication: Mobility status (pt on RA) PT Visit Diagnosis: Unsteadiness on feet (R26.81);Muscle weakness (generalized) (M62.81);Other abnormalities of gait and mobility (R26.89)     Time: 0240-9735 PT Time Calculation (min) (ACUTE ONLY): 18 min  Charges:  $Therapeutic Exercise: 8-22 mins                     West Carbo, PT, DPT   Acute Rehabilitation Department   Sandra Cockayne 10/27/2022, 5:58 PM

## 2022-10-27 NOTE — Progress Notes (Signed)
Progress Note   Patient: [name] HMC:[id number] DOB: 05-17-1941 DOA: 10/16/2022     11 DOS: the patient was seen and examined on 10/27/2022   Brief hospital course: [name] was admitted to the hospital with the working diagnosis of heart failure decompensation.   81  yo female with the past medical history of AML, atrial fibrillation, COPD, coronary artery disease, T2DM, hypertension and heart failure who presented with edema. Patient had a recent hip fracture on 10/02/22 and had therapy at inpatient rehab. She noted worsening edema and decreased mobility, associated with dyspnea. On her initial physical examination her blood pressure was 129/52, HR 68, RR 22 and 02 saturation 93%, lungs with no wheezing or rhonchi, no rales, heart with S1 and S2 present and rhythmic, abdomen with no distention, positive lower extremity edema.   Na 140, K 4,4 Cl 111, bicarbonate 19, glucose 90 bun 78 cr 3,26  BNP 1,198 High sensitive troponin 10 and 12  Wbc 4,7 hgb 9,2 plt 136   Chest radiograph with cardiomegaly, with bilateral pleural effusions, more right than left, with bilateral hilar vascular congestion. Positive port on SVC RA junction.   EKG 66 bpm, normal axis, qtc with no prolongation, atrial fibrillation rhythm with no significant ST segment or T wave changes, low voltage.  Patient was placed on furosemide for diuresis.  Echocardiogram with preserved LV systolic function.   11/17 her volume status is improving with diuresis.  11/18 volume status continue to improve 11/19 follow up chest radiograph with persistent right pleural effusion, will request US guided thoracentesis.  11/20 right sided thoracentesis 1L fluid removed.   Assessment and Plan: * Acute on chronic diastolic (congestive) heart failure (HCC) Echocardiogram with preserved LV systolic function with EF 60 to 65%, mild LVH. RV systolic function is preserved. Moderate dilatation LA, moderate to severe TR, RVSP 40,9 mmHg.  Trivial pericardial effusion. Mild to moderate aortic stenosis.   Urine output is 2,125 ml over last 24 hrs Systolic blood pressure 662 to 160 mmHg.  Patient had diuresis with furosemide and one dose of metolazone.  Continue with metoprolol.  Acute on chronic hypoxemic respiratory failure due to acute cardiogenic pulmonary edema, right pleural effusion.  Sp thoracentesis, 1 L, follow up with fluid analysis. 02 saturation is 92% on room air.   AKI (acute kidney injury) (HCC) CKD stage 4.   Renal function with serum cr at 2,36 with K at 4,4 and serum bicarbonate at 33. Hold on diuretic therapy for now and follow up renal function in am. Avoid hypotension and nephrotoxic medications.   Essential hypertension Continue blood pressure control with metoprolol  Close blood pressure monitoring   CAD (coronary artery disease) No chest pain, continue with antiplatelet therapy/   Persistent atrial fibrillation (HCC) Continue rate control with metoprolol and continue anticoagulation with apixaban.   Type 2 diabetes mellitus with hyperlipidemia (HCC) Continue glucose cover and monitoring with insulin sliding scale Fasting glucose is 189 mg/dl.  Basal insulin 8 units  Continue statin therapy with atorvastatin   Seizure ([place]) No active seizures  Continue with keppra and as needed lorazepam.   Chronic pain disorder Continue with analgesics Patient very weak and debilitated, at home uses walker and wheelchair for mobility. Poor prognosis.   COPD (chronic obstructive pulmonary disease) (HCC) No clinical signs of exacerbation Patient has chronic hypoxemic respiratory failure with supplemental 02 at home Continue bronchodilator therapy.   DNR (do not resuscitate) DNR         Subjective:  Patient with improvement in dyspnea and edema, she had thoracentesis this am.    Physical Exam: Vitals:   10/26/22 1942 10/27/22 0121 10/27/22 0456 10/27/22 0835  BP: (!) 152/49  (!) 163/52  (!) 130/55  Pulse: 67  (!) 54 71  Resp: '20  19 18  '$ Temp: (!) 97.3 F (36.3 C)  (!) 97.3 F (36.3 C)   TempSrc: Oral  Oral   SpO2: 99%  91% 92%  Weight:  65 kg    Height:       Neurology mild somnolent but easy to arouse,  ENT with mild pallor Cardiovascular with S1 and S2 present, irregularly irregular with no gallops, rubs or murmurs Respiratory with mild rales at bases with no wheezing Abdomen with no distention  No lower extremity edema  Data Reviewed:    Family Communication: no family at the bedside   Disposition: Status is: Inpatient Remains inpatient appropriate because: renal failure   Planned Discharge Destination: Home    Author: Tawni Millers, MD 10/27/2022 12:44 PM  For on call review [email or web address].

## 2022-10-27 NOTE — Procedures (Signed)
Ultrasound-guided diagnostic and therapeutic right sided thoracentesis performed yielding 1 liters of straw colored fluid. No immediate complications.   Diagnostic fluid was sent to the lab for further analysis. Follow-up chest x-ray pending. EBL is < 2 ml.

## 2022-10-27 NOTE — Progress Notes (Signed)
   Heart Failure Stewardship Pharmacist Progress Note   PCP: Isaac Bliss, Rayford Halsted, MD PCP-Cardiologist: Mertie Moores, MD    HPI:  [name] is an 81 year old female with a PMH of CHF, NSTEMI s/p PCI to RCA, atrial flutter, AML, COPD, CKD, hypertension, hyperlipidemia and hepatic failure who presented with worsening edema and shortness of breath. She was recently discharged from rehab, reports symptom worsening despite adherence to diuretic regimen. In 08/2017, echocardiogram showed LVEF of 40-45% which has since improved to 60-65% on echocardiogram from 04/2022. Patient has previously tried Delene Loll, but it was stopped for 'itching.' AKI present on admission. Renal function improved with diuresis initially, but creatinine is up to 2.36 today.   Current HF Medications: Beta Blocker: metoprolol succinate 12.5 mg daily  Prior to admission HF Medications: Diuretic: torsemide 40 mg daily  Beta blocker: metoprolol succinate 12.5 mg daily ACE/ARB/ARNI: valsartan 80 mg daily SGLT2i: Jardiance 25 mg daily Other: Imdur 30 mg daily  Pertinent Lab Values: Serum creatinine 2.36, BUN 64, Potassium 4.4, Sodium 142, BNP 1198.5 on 11/9 down to 792 on 11/19, Magnesium 1.8, A1c 9.9   Vital Signs: Weight: 143.3 lbs (admission weight: 172.2 lbs) Blood pressure: 130-160/60s Heart rate: 70s  I/O: -2.8L yesterday, net -8.6L this admission  Medication Assistance / Insurance Benefits Check: Does the patient have prescription insurance?  Yes Type of insurance plan: Medicare  Does the patient qualify for medication assistance through manufacturers or grants?   Pending Eligible grants and/or patient assistance programs: pending Medication assistance applications in progress: pending  Medication assistance applications approved: pending Approved medication assistance renewals will be completed by: pending  Outpatient Pharmacy:  Prior to admission outpatient pharmacy: [facility] Is the patient  willing to use [facility] at discharge? Yes Is the patient willing to transition their outpatient pharmacy to utilize a [facility] outpatient pharmacy?   Pending   Assessment: 1. Acute on chronic diastolic HFimpEF (LVEF 54-62%) due to ICM. NYHA class III symptoms. - Continue strict I/Os along with daily weights. Maintain K >4 and Mg >2. Weight and edema are significantly down from admission. Patient reports no repiratory issues this morning.  - Creatinine is elevated after diuresis, can consider resuming medications if AKI improves prior to discharge.   Plan: 1) Medication changes recommended at this time: - Hold diuresis today - Will check Mg in AM  2) Patient assistance: - Pending  3)  Education  - Initial education done. To be completed prior to discharge  Thank you for allowing pharmacy to participate in this patient's care.  Reatha Harps, PharmD PGY2 Pharmacy Resident 10/27/2022 8:40 AM Check [email or web address] for unit specific pharmacy number

## 2022-10-27 NOTE — Progress Notes (Signed)
PT Cancellation Note  Patient Details Name: [name] MRN: [id number] DOB: [birth year]   Cancelled Treatment:    Reason Eval/Treat Not Completed: Fatigue/lethargy limiting ability to participate at this time, pt politely asking to rest for a little prior to working with PT will return as time/schedule allows.   West Carbo, PT, DPT   Acute Rehabilitation Department   Sandra Cockayne 10/27/2022, 12:47 PM

## 2022-10-27 NOTE — Plan of Care (Signed)
  Problem: Education: Goal: Ability to demonstrate management of disease process will improve Outcome: Progressing   Problem: Activity: Goal: Capacity to carry out activities will improve Outcome: Progressing   Problem: Education: Goal: Knowledge of General Education information will improve Description: Including pain rating scale, medication(s)/side effects and non-pharmacologic comfort measures Outcome: Progressing   

## 2022-10-27 NOTE — Inpatient Diabetes Management (Signed)
Inpatient Diabetes Program Recommendations  AACE/ADA: New Consensus Statement on Inpatient Glycemic Control (2015)  Target Ranges:  Prepandial:   less than 140 mg/dL      Peak postprandial:   less than 180 mg/dL (1-2 hours)      Critically ill patients:  140 - 180 mg/dL    Latest Reference Range & Units 10/26/22 06:11 10/26/22 11:21 10/26/22 16:09 10/26/22 21:05  Glucose-Capillary 70 - 99 mg/dL 215 (H)  3 units Novolog  8 units Levemir '@0817'$   316 (H)  7 units Novolog  86 177 (H)  (H): Data is abnormally high  Latest Reference Range & Units 10/27/22 06:09 10/27/22 11:27  Glucose-Capillary 70 - 99 mg/dL 166 (H)  2 units Novolog  8 units Levemir '@0835'$  285 (H)  5 units Novolog   (H): Data is abnormally high    Home DM Meds: Jardiance 25 mg daily     Toujeo 10 units at Supper/ 16 units QHS   Current Orders: Levemir 8 units daily  Novolog 0-9 units TID      MD- Looks like pt eating 100% meals.  Afternoon CBGs elevated.  Please consider starting Novolog Meal Coverage:  Novolog 4 units TID with meals HOLD if pt NPO HOLD if pt eats <50% meals    --Will follow patient during hospitalization--  Wyn Quaker RN, MSN, [place] Diabetes Coordinator Inpatient Glycemic Control Team Team Pager: [phone] (8a-5p)

## 2022-10-27 NOTE — Progress Notes (Signed)
Occupational Therapy Treatment Patient Details Name: CHRISTY EHRSAM MRN: [id number] DOB: 1941/06/25 Today's Date: 10/27/2022   History of present illness The pt is an 81 yo female presenting 11/9 with SOB and LE edema, found to have CHF exacerbation. Pt with recent admission for hip fx, d/c from inpatient rehab 10/26. PMH includes: arthritis, cardiomyopathy, CKD IV, CHF, COPD, CAD, DM II, HLD, HTN, and NSTEMI.   OT comments  Pt progressing towards goals, able to complete standing/seated ADLs with min guard-minA, able to demo good use of sock aid to don socks seated EOB. Pt supervision for bed mobility and min guard A for transfers using RW. Pt SpO2 91% on RA after ambulation to bathroom. Pt presenting with impairments listed below, will follow acutely. Continue to recommend HHOT at d/c.   Recommendations for follow up therapy are one component of a multi-disciplinary discharge planning process, led by the attending physician.  Recommendations may be updated based on patient status, additional functional criteria and insurance authorization.    Follow Up Recommendations  Home health OT     Assistance Recommended at Discharge Set up Supervision/Assistance  Patient can return home with the following  A little help with walking and/or transfers;A little help with bathing/dressing/bathroom;Assistance with cooking/housework;Help with stairs or ramp for entrance;Assist for transportation   Equipment Recommendations  None recommended by OT    Recommendations for Other Services PT consult    Precautions / Restrictions Precautions Precautions: Fall Precaution Comments: monitor SpO2 to maintain >92%, hx of seizure disorder, no R LE ROM restrictions Restrictions Weight Bearing Restrictions: No RLE Weight Bearing: Weight bearing as tolerated       Mobility Bed Mobility Overal bed mobility: Needs Assistance Bed Mobility: Supine to Sit     Supine to sit: Supervision, HOB elevated Sit to  supine: Supervision        Transfers Overall transfer level: Needs assistance Equipment used: Rolling walker (2 wheels) Transfers: Sit to/from Stand Sit to Stand: Min guard                 Balance Overall balance assessment: Needs assistance Sitting-balance support: No upper extremity supported, Feet supported Sitting balance-Leahy Scale: Fair     Standing balance support: Bilateral upper extremity supported, During functional activity, No upper extremity supported Standing balance-Leahy Scale: Fair                             ADL either performed or assessed with clinical judgement   ADL Overall ADL's : Needs assistance/impaired     Grooming: Min guard;Standing;Oral care               Lower Body Dressing: Minimal assistance;With adaptive equipment;Sitting/lateral leans Lower Body Dressing Details (indicate cue type and reason): with sock aid Toilet Transfer: Min guard;Rolling walker (2 wheels)   Toileting- Clothing Manipulation and Hygiene: Modified independent;Sitting/lateral lean Toileting - Clothing Manipulation Details (indicate cue type and reason): pericare     Functional mobility during ADLs: Min guard;Rolling walker (2 wheels)      Extremity/Trunk Assessment Upper Extremity Assessment Upper Extremity Assessment: Generalized weakness   Lower Extremity Assessment Lower Extremity Assessment: Defer to PT evaluation        Vision   Vision Assessment?: No apparent visual deficits   Perception Perception Perception: Not tested   Praxis Praxis Praxis: Not tested    Cognition Arousal/Alertness: Awake/alert Behavior During Therapy: WFL for tasks assessed/performed Overall Cognitive Status: Within Functional Limits for tasks  assessed                                 General Comments: limited self monitoring of symptoms noted        Exercises      Shoulder Instructions       General Comments SpO2 92% on RA  after ambulation to sink with good wave pleth    Pertinent Vitals/ Pain       Pain Assessment Pain Assessment: No/denies pain Pain Location: BLE Pain Descriptors / Indicators: Discomfort, Spasm Pain Intervention(s): Limited activity within patient's tolerance, Monitored during session, Repositioned  Home Living                                          Prior Functioning/Environment              Frequency  Min 2X/week        Progress Toward Goals  OT Goals(current goals can now be found in the care plan section)  Progress towards OT goals: Progressing toward goals  Acute Rehab OT Goals Patient Stated Goal: to go home OT Goal Formulation: With patient Time For Goal Achievement: 10/31/22 Potential to Achieve Goals: Good ADL Goals Pt Will Perform Upper Body Bathing: with set-up Pt Will Perform Lower Body Bathing: Independently;sitting/lateral leans;sit to/from stand Pt Will Perform Upper Body Dressing: with set-up Pt Will Perform Lower Body Dressing: Independently;sit to/from stand;sitting/lateral leans Pt Will Transfer to Toilet: Independently Pt Will Perform Toileting - Clothing Manipulation and hygiene: Independently Pt Will Perform Tub/Shower Transfer: with min guard assist Additional ADL Goal #1: Patient will be able to verbalize 3 strategies for energy conservation and fall prevention for safe discharge home. Additional ADL Goal #2: Patient will be able to state 3 strategies for managing CHF levels to prevent further hospital admissions.  Plan Discharge plan remains appropriate    Co-evaluation                 AM-PAC OT "6 Clicks" Daily Activity     Outcome Measure   Help from another person eating meals?: None Help from another person taking care of personal grooming?: A Little Help from another person toileting, which includes using toliet, bedpan, or urinal?: A Little Help from another person bathing (including washing, rinsing,  drying)?: A Little Help from another person to put on and taking off regular upper body clothing?: None Help from another person to put on and taking off regular lower body clothing?: A Little 6 Click Score: 20    End of Session Equipment Utilized During Treatment: Rolling walker (2 wheels)  OT Visit Diagnosis: Unsteadiness on feet (R26.81);Other abnormalities of gait and mobility (R26.89);Muscle weakness (generalized) (M62.81)   Activity Tolerance Patient tolerated treatment well   Patient Left in chair;with call bell/phone within reach   Nurse Communication Mobility status        Time: 0750-0809 OT Time Calculation (min): 19 min  Charges: OT General Charges $OT Visit: 1 Visit OT Treatments $Self Care/Home Management : 8-22 mins  Renaye Rakers, OTD, OTR/L SecureChat Preferred Acute Rehab [phone] - [place] 10/27/2022, 8:13 AM

## 2022-10-27 NOTE — TOC Progression Note (Addendum)
Transition of Care [facility]) - Progression Note    Patient Details  Name: [name] MRN: [id number] Date of Birth: 1941/01/23  Transition of Care [facility]) CM/SW Contact  Zenon Mayo, RN Phone Number: 10/27/2022, 2:54 PM  Clinical Narrative:    Conts with fluid overload, cret is high at 2.36, conts ? If will need thoracentesis todkay.  Workmens Comp is following patient, she is set up with Product/process development scientist for [employer], [place] per [employer].  TOC following.  Erline Levine [place] [phone] (Workmans Youth worker)   Expected Discharge Plan: [place] Barriers to Discharge: Continued Medical Work up  Expected Discharge Plan and Services Expected Discharge Plan: [place]   Discharge Planning Services: CM Consult Post Acute Care Choice: [place] arrangements for the past 2 months: Single Family Home                           HH Arranged: PT, OT, RN, Nurse's Aide [place] Agency: Other - See comment (Through [employer] comp insurance- Forensic scientist) Date [place]: 10/17/22 Time [place]: 7253 Representative spoke with at [place]: [name] and Lanney Gins   Social Determinants of Health (SDOH) Interventions Food Insecurity Interventions: Intervention Not Indicated Housing Interventions: Intervention Not Indicated Transportation Interventions: Intervention Not Indicated Utilities Interventions: Intervention Not Indicated Alcohol Usage Interventions: Intervention Not Indicated (Score <7) Financial Strain Interventions: Intervention Not Indicated  Readmission Risk Interventions     No data to display

## 2022-10-28 ENCOUNTER — Other Ambulatory Visit: Payer: Self-pay

## 2022-10-28 DIAGNOSIS — R569 Unspecified convulsions: Secondary | ICD-10-CM

## 2022-10-28 DIAGNOSIS — I2510 Atherosclerotic heart disease of native coronary artery without angina pectoris: Secondary | ICD-10-CM

## 2022-10-28 LAB — BASIC METABOLIC PANEL
Anion gap: 12 (ref 5–15)
BUN: 61 mg/dL — ABNORMAL HIGH (ref 8–23)
CO2: 33 mmol/L — ABNORMAL HIGH (ref 22–32)
Calcium: 7.8 mg/dL — ABNORMAL LOW (ref 8.9–10.3)
Chloride: 97 mmol/L — ABNORMAL LOW (ref 98–111)
Creatinine, Ser: 1.96 mg/dL — ABNORMAL HIGH (ref 0.44–1.00)
GFR, Estimated: 25 mL/min — ABNORMAL LOW (ref >60)
Glucose, Bld: 131 mg/dL — ABNORMAL HIGH (ref 70–99)
Potassium: 4.5 mmol/L (ref 3.5–5.1)
Sodium: 142 mmol/L (ref 135–145)

## 2022-10-28 LAB — PATHOLOGIST SMEAR REVIEW

## 2022-10-28 LAB — GLUCOSE, CAPILLARY
Glucose-Capillary: 136 mg/dL — ABNORMAL HIGH (ref 70–99)
Glucose-Capillary: 238 mg/dL — ABNORMAL HIGH (ref 70–99)
Glucose-Capillary: 239 mg/dL — ABNORMAL HIGH (ref 70–99)
Glucose-Capillary: 116 mg/dL — ABNORMAL HIGH (ref 70–99)

## 2022-10-28 LAB — MAGNESIUM: Magnesium: 2 mg/dL (ref 1.7–2.4)

## 2022-10-28 MED ORDER — TORSEMIDE 20 MG PO TABS: 40.0000 mg | ORAL_TABLET | Freq: Every day | ORAL | 0 refills | Status: DC

## 2022-10-28 NOTE — Progress Notes (Signed)
Physical Therapy Treatment Patient Details Name: [name] MRN: [id number] DOB: 1941/01/20 Today's Date: 10/28/2022   History of Present Illness The pt is an 81 yo female presenting 11/9 with SOB and LE edema, found to have CHF exacerbation. Pt with recent admission for hip fx, d/c from inpatient rehab 10/26. PMH includes: arthritis, cardiomyopathy, CKD IV, CHF, COPD, CAD, DM II, HLD, HTN, and NSTEMI.    PT Comments    Received pt sitting EOB and agreeable to PT treatment. Pt recalled working with this therapist in CIR a few weeks prior. Pt performed all transfers with rollator and min guard and progressed in ambulation in hallway with rollator and close supervision (445f). Pt actually able to let go of rollator and take a few steps without UE assist and min guard. HR 82bpm sitting EOB at start of session increasing to 102bpm with ambulation. SPO2 dropped to 86% after ambulating but pt able to recover with cues for pursed lip breathing. Pt left sitting EOB safely with all needs within reach. Acute PT to cont to follow.     Recommendations for follow up therapy are one component of a multi-disciplinary discharge planning process, led by the attending physician.  Recommendations may be updated based on patient status, additional functional criteria and insurance authorization.  Follow Up Recommendations  Home health PT     Assistance Recommended at Discharge Intermittent Supervision/Assistance  Patient can return home with the following A little help with walking and/or transfers;A little help with bathing/dressing/bathroom;Assistance with cooking/housework;Help with stairs or ramp for entrance;Assist for transportation   Equipment Recommendations  None recommended by PT    Recommendations for Other Services       Precautions / Restrictions Precautions Precautions: Fall Precaution Comments: monitor SpO2 to maintain >92%, hx of seizure disorder, no R LE ROM  restrictions Restrictions Weight Bearing Restrictions: No     Mobility  Bed Mobility               General bed mobility comments: pt sitting EOB at start and end of session Patient Response: Cooperative  Transfers Overall transfer level: Needs assistance Equipment used: Rollator (4 wheels) Transfers: Sit to/from Stand Sit to Stand: Min guard           General transfer comment: min guard/close supervision to stand from EOB with rollator    Ambulation/Gait Ambulation/Gait assistance: Supervision Gait Distance (Feet): 425 Feet Assistive device: Rollator (4 wheels) Gait Pattern/deviations: Step-through pattern, Decreased stride length, Trunk flexed, Decreased step length - right, Decreased step length - left, Narrow base of support Gait velocity: slightly decreased Gait velocity interpretation: 1.31 - 2.62 ft/sec, indicative of limited community ambulator   General Gait Details: SPO2 dropped to 86% on RA but increased with reminders for pursed lip breathing. Pt able to carry on conversation with therapist while ambulating   Stairs             Wheelchair Mobility    Modified Rankin (Stroke Patients Only)       Balance Overall balance assessment: Needs assistance Sitting-balance support: No upper extremity supported, Feet supported Sitting balance-Leahy Scale: Good Sitting balance - Comments: sitting EOB upon therapist's entry/exit   Standing balance support: Bilateral upper extremity supported, During functional activity, No upper extremity supported Standing balance-Leahy Scale: Fair Standing balance comment: able to maintain static and dynamic standing balance with close supervision  Cognition Arousal/Alertness: Awake/alert Behavior During Therapy: WFL for tasks assessed/performed Overall Cognitive Status: Within Functional Limits for tasks assessed                                 General  Comments: required cues for pursed lip breathing - pt very motivated to get better and return home with husband        Exercises      General Comments General comments (skin integrity, edema, etc.): HR increased to 102bpm with gait, pt denied any SOB      Pertinent Vitals/Pain Pain Assessment Pain Score: 5  Pain Location: RLE Pain Descriptors / Indicators: Discomfort Pain Intervention(s): Monitored during session, Limited activity within patient's tolerance, Repositioned    Home Living                          Prior Function            PT Goals (current goals can now be found in the care plan section) Acute Rehab PT Goals Patient Stated Goal: return home PT Goal Formulation: With patient Time For Goal Achievement: 10/31/22 Potential to Achieve Goals: Good Progress towards PT goals: Progressing toward goals    Frequency    Min 3X/week      PT Plan Current plan remains appropriate    Co-evaluation              AM-PAC PT "6 Clicks" Mobility   Outcome Measure  Help needed turning from your back to your side while in a flat bed without using bedrails?: A Little Help needed moving from lying on your back to sitting on the side of a flat bed without using bedrails?: A Little Help needed moving to and from a bed to a chair (including a wheelchair)?: A Little Help needed standing up from a chair using your arms (e.g., wheelchair or bedside chair)?: A Little Help needed to walk in hospital room?: A Little Help needed climbing 3-5 steps with a railing? : A Lot 6 Click Score: 17    End of Session Equipment Utilized During Treatment: Gait belt Activity Tolerance: Patient tolerated treatment well;Patient limited by fatigue Patient left: in bed;with call bell/phone within reach (sitting EOB safely) Nurse Communication: Mobility status PT Visit Diagnosis: Unsteadiness on feet (R26.81);Muscle weakness (generalized) (M62.81);Other abnormalities of gait and  mobility (R26.89);Pain Pain - Right/Left: Right Pain - part of body: Leg     Time: 8280-0349 PT Time Calculation (min) (ACUTE ONLY): 23 min  Charges:  $Gait Training: 8-22 mins $Therapeutic Activity: 8-22 mins                     Becky Sax PT, DPT  Blenda Nicely 10/28/2022, 9:19 AM

## 2022-10-28 NOTE — Plan of Care (Signed)
  Problem: Education: Goal: Ability to demonstrate management of disease process will improve Outcome: Progressing   Problem: Education: Goal: Ability to verbalize understanding of medication therapies will improve Outcome: Progressing   

## 2022-10-28 NOTE — Progress Notes (Signed)
Rounding Note    Patient Name: [name] Date of Encounter: 10/28/2022  [place] Cardiologist: Mertie Moores, MD   Subjective   She feels that her breathing improved after 1 L right thoracentesis.  Walk to the nurses station without oxygen today, without dyspnea or desaturation.  Able to lie fully supine in bed without dyspnea. Denies any pleuritic discomfort. After a spike yesterday, creatinine is heading back down. Weight is roughly 140 pounds.  Inpatient Medications    Scheduled Meds:  apixaban  2.5 mg Oral BID   atorvastatin  40 mg Oral Daily   Chlorhexidine Gluconate Cloth  6 each Topical Daily   clopidogrel  75 mg Oral QPM   docusate sodium  100 mg Oral BID   insulin aspart  0-9 Units Subcutaneous TID WC   insulin detemir  8 Units Subcutaneous Daily   letrozole  2.5 mg Oral Daily   levETIRAcetam  500 mg Oral BID   melatonin  3 mg Oral QHS   metoprolol succinate  12.5 mg Oral Daily   polyethylene glycol  17 g Oral Daily   psyllium  1 packet Oral Daily   sodium chloride flush  3 mL Intravenous Q12H   Continuous Infusions:  PRN Meds: acetaminophen **OR** acetaminophen, alum & mag hydroxide-simeth, bisacodyl, fluticasone furoate-vilanterol, hydrocortisone cream, ipratropium-albuterol, LORazepam, methocarbamol, ondansetron **OR** ondansetron (ZOFRAN) IV, oxyCODONE, traZODone   Vital Signs    Vitals:   10/28/22 0434 10/28/22 0437 10/28/22 0809 10/28/22 0912  BP:   (!) 148/39   Pulse:   73 82  Resp:   19   Temp:   98.5 F (36.9 C)   TempSrc:   Oral   SpO2: 90%  94%   Weight:  63.3 kg    Height:        Intake/Output Summary (Last 24 hours) at 10/28/2022 1025 Last data filed at 10/28/2022 0750 Gross per 24 hour  Intake 720 ml  Output 950 ml  Net -230 ml      10/28/2022    4:37 AM 10/27/2022    1:21 AM 10/26/2022    3:00 AM  Last 3 Weights  Weight (lbs) 139 lb 8.8 oz 143 lb 4.8 oz 139 lb 1.8 oz  Weight (kg) 63.3 kg 65 kg 63.1 kg       Telemetry    Atrial fibrillation with controlled ventricular response- Personally Reviewed  ECG    No new tracing- Personally Reviewed  Physical Exam  Pale GEN: No acute distress.   Neck: No JVD Cardiac: Irregular, no murmurs, rubs, or gallops.  Respiratory: Mild dullness to percussion in the bases bilaterally, otherwise clear GI: Soft, nontender, non-distended  MS: No edema; No deformity. Neuro:  Nonfocal  Psych: Normal affect   Labs    High Sensitivity Troponin:   Recent Labs  Lab 10/16/22 0342 10/16/22 0510  TROPONINIHS 10 12     Chemistry Recent Labs  Lab 10/24/22 0330 10/25/22 0335 10/26/22 0215 10/27/22 0318 10/28/22 0436  NA 143   < > 141 142 142  K 3.7   < > 4.4 4.4 4.5  CL 103   < > 97* 100 97*  CO2 29   < > 34* 33* 33*  GLUCOSE 102*   < > 141* 189* 131*  BUN 69*   < > 63* 64* 61*  CREATININE 1.67*   < > 1.91* 2.36* 1.96*  CALCIUM 7.6*   < > 7.7* 7.8* 7.8*  MG 1.8  --   --  2.0 2.0  GFRNONAA 31*   < > 26* 20* 25*  ANIONGAP 11   < > '10 9 12   '$ < > = values in this interval not displayed.    Lipids No results for input(s): "CHOL", "TRIG", "HDL", "LABVLDL", "LDLCALC", "CHOLHDL" in the last 168 hours.  Hematology Recent Labs  Lab 10/22/22 0354 10/25/22 0335  WBC 4.2 3.9*  RBC 2.72* 2.91*  HGB 8.6* 8.9*  HCT 26.1* 27.8*  MCV 96.0 95.5  MCH 31.6 30.6  MCHC 33.0 32.0  RDW 15.7* 15.3  PLT 93* 90*   Thyroid No results for input(s): "TSH", "FREET4" in the last 168 hours.  BNP Recent Labs  Lab 10/26/22 0934  BNP 791.8*    DDimer No results for input(s): "DDIMER" in the last 168 hours.   Radiology    IR THORACENTESIS ASP PLEURAL SPACE W/IMG GUIDE  Result Date: 10/27/2022 INDICATION: Patient with history of CHF. Found to have a right sided pleural effusion. Request is for therapeutic and diagnostic right-sided thoracentesis EXAM: ULTRASOUND GUIDED THERAPEUTIC AND DIAGNOSTIC RIGHT-SIDED THORACENTESIS MEDICATIONS: Lidocaine 1% 10 mL  COMPLICATIONS: None immediate. PROCEDURE: An ultrasound guided thoracentesis was thoroughly discussed with the patient and questions answered. The benefits, risks, alternatives and complications were also discussed. The patient understands and wishes to proceed with the procedure. Written consent was obtained. Ultrasound was performed to localize and mark an adequate pocket of fluid in the right chest. The area was then prepped and draped in the normal sterile fashion. 1% Lidocaine was used for local anesthesia. Under ultrasound guidance a 6 Fr Safe-T-Centesis catheter was introduced. Thoracentesis was performed. The catheter was removed and a dressing applied. FINDINGS: A total of approximately 1 L of straw-colored fluid was removed. Samples were sent to the laboratory as requested by the clinical team. IMPRESSION: Successful ultrasound guided therapeutic and diagnostic right-sided thoracentesis yielding 1 L of pleural fluid. Read by: Rushie Nyhan, NP Electronically Signed   By: Corrie Mckusick D.O.   On: 10/27/2022 11:38   DG Chest 1 View  Result Date: 10/27/2022 CLINICAL DATA:  RIGHT-sided thoracentesis. EXAM: CHEST  1 VIEW COMPARISON:  10/18/2022 FINDINGS: Interval reduction in RIGHT pleural fluid. No pneumothorax appreciated. Port in the anterior chest wall with tip in distal SVC. Small LEFT effusion additionally. No pulmonary edema. IMPRESSION: 1. No pneumothorax following RIGHT thoracentesis. 2. Bilateral pleural effusions Electronically Signed   By: Suzy Bouchard M.D.   On: 10/27/2022 10:33   DG Chest 2 View  Result Date: 10/26/2022 CLINICAL DATA:  CHF EXAM: CHEST - 2 VIEW COMPARISON:  CXR 10/18/22 FINDINGS: Right-sided needle access port in place with the tip near the cavoatrial junction. There are small bilateral pleural effusions. Compared to prior exam there is interval increase in hazy opacity at the right lung base, which could represent a combination of atelectasis and a layering  pleural effusion. Visualized upper abdomen is unremarkable. Compared to prior exam there is slightly increased prominence of the right lower lobe lobar pulmonary artery. Cardiac and mediastinal contours are otherwise unchanged. No displaced rib fracture. No pneumothorax. Degenerative changes of the bilateral AC and glenohumeral joints. Vertebral body heights are maintained. IMPRESSION: 1.  Small bilateral pleural effusions, increased from prior. 2. Increased prominence of the right lower lobe lobar pulmonary artery, nonspecific. Electronically Signed   By: Marin Roberts M.D.   On: 10/26/2022 12:00    Cardiac Studies   Echo 10/16/2022:    1. Left ventricular ejection fraction, by estimation, is 60 to 65%.  The  left ventricle has normal function. The left ventricle has no regional  wall motion abnormalities. There is mild left ventricular hypertrophy.  Left ventricular diastolic parameters  are indeterminate.   2. Right ventricular systolic function is normal. The right ventricular  size is normal. There is mildly elevated pulmonary artery systolic  pressure.   3. Left atrial size was moderately dilated.   4. The mitral valve is degenerative. Mild mitral valve regurgitation. No  evidence of mitral stenosis. Severe mitral annular calcification.   5. Tricuspid valve regurgitation is moderate to severe.   6. The aortic valve is normal in structure. There is severe calcifcation  of the aortic valve. There is severe thickening of the aortic valve.  Aortic valve regurgitation is not visualized. Mild to moderate aortic  valve stenosis.   7. The inferior vena cava is normal in size with greater than 50%  respiratory variability, suggesting right atrial pressure of 3 mmHg.   Patient Profile     81 y.o. female with CAD s/p NSTEMI and RCA PCI, HFmrEF and recovered LV function, persistent atrial fibrillation, mild-moderate aortic stenosis, hypertension, hyperlipidemia, DM, COPD, breast cancer, AML, and  cirrhosis admitted with acute on chronic diastolic heart failure with anasarca and AKI.    Assessment & Plan    Appears clinically euvolemic. "Dry weight" appears to be 140-145 pounds. Creatinine is showing improving trend, but is not yet back to baseline. [place] will sign off.   Medication Recommendations:   Eliquis 2.5 mg twice daily Clopidogrel 75 mg once daily Atorvastatin 40 mg daily Metoprolol succinate 12.5 mg daily  Isosorbide mononitrate 30 mg daily Once weight reaches 145 pounds, resume torsemide 40 mg daily   Once creatinine is less than 1.7 restart empagliflozin 25 mg daily and valsartan 80 mg daily (this can be decided at her follow-up appointment in the [facility] clinic)  Other recommendations (labs, testing, etc): Repeat basic metabolic panel 1 week after discharge, when she returns for follow-up Follow up as an outpatient: 11/04/2022 cardiovascular TOC clinic  For questions or updates, please contact [place] Please consult [email or web address] for contact info under        Signed, Sanda Klein, MD  10/28/2022, 10:25 AM

## 2022-10-28 NOTE — Discharge Summary (Signed)
Physician Discharge Summary   Catherine: [name] MRN: [id number] DOB: 01/20/1941  Admit date:     10/16/2022  Discharge date: 10/28/22  Discharge Physician: Jimmy Picket Yula Crotwell   PCP: Isaac Bliss, Rayford Halsted, MD   Recommendations at discharge:    Catherine will continue torsemide 40 mg daily, increase to bid in case of volume overload, weight gain 2 to 3 lbs in 24 hrs or 5 lbs in 7 days.  Follow up renal function and electrolytes in 7 to 10 days Follow up with Dr Jerilee Hoh in 7 to 10 days. Follow up with Cardiology as outpatient Catherine should resume SGLT 2 inh and Valsartan when serum creatinine less than 1,7   Discharge Diagnoses: Principal Problem:   Acute on chronic diastolic (congestive) heart failure (HCC) Active Problems:   AKI (acute kidney injury) ([place])   Essential hypertension   CAD (coronary artery disease)   Persistent atrial fibrillation (HCC)   Type 2 diabetes mellitus with hyperlipidemia (HCC)   Seizure (HCC)   Chronic pain disorder   COPD (chronic obstructive pulmonary disease) ([place])   DNR (do not resuscitate)  Resolved Problems:   * No resolved hospital problems. [facility] Course: [name] was admitted to the hospital with the working diagnosis of heart failure decompensation.   81  yo female with the past medical history of AML, atrial fibrillation, COPD, coronary artery disease, T2DM, hypertension and heart failure who presented with edema. Catherine had a recent hip fracture on 10/02/22 and had therapy at inpatient rehab. She noted worsening edema and decreased mobility, associated with dyspnea. On her initial physical examination her blood pressure was 129/52, HR 68, RR 22 and 02 saturation 93%, lungs with no wheezing or rhonchi, no rales, heart with S1 and S2 present and rhythmic, abdomen with no distention, positive lower extremity edema.   Na 140, K 4,4 Cl 111, bicarbonate 19, glucose 90 bun 78 cr 3,26  BNP 1,198 High sensitive troponin 10 and  12  Wbc 4,7 hgb 9,2 plt 136   Chest radiograph with cardiomegaly, with bilateral pleural effusions, more right than left, with bilateral hilar vascular congestion. Positive port on SVC RA junction.   EKG 66 bpm, normal axis, qtc with no prolongation, atrial fibrillation rhythm with no significant ST segment or T wave changes, low voltage.  Catherine was placed on furosemide for diuresis.  Echocardiogram with preserved LV systolic function.   11/17 her volume status is improving with diuresis.  11/18 volume status continue to improve 11/19 follow up chest radiograph with persistent right pleural effusion, will request US guided thoracentesis.  11/20 right sided thoracentesis 1L fluid removed.  11/21 improved volume status and symptoms, plan to continue medical therapy as outpatient and close follow up as outpatient.   Assessment and Plan: * Acute on chronic diastolic (congestive) heart failure (HCC) Echocardiogram with preserved LV systolic function with EF 60 to 65%, mild LVH. RV systolic function is preserved. Moderate dilatation LA, moderate to severe TR, RVSP 40,9 mmHg. Trivial pericardial effusion. Mild to moderate aortic stenosis.   Catherine was placed on furosemide for diuresis, negative fluid balance was achieved, -11,112 ml, with significant improvement in her symptoms.   Catherine had diuresis with furosemide and one dose of metolazone.  At the time of her discharge she will continue taking metoprolol for B blockade. Diuresis with torsemide 40 mg daily, increase to bid in case of volume overload.  Acute on chronic hypoxemic respiratory failure due to acute cardiogenic pulmonary edema, right  pleural effusion.  Sp thoracentesis, 1 L, follow up with fluid analysis. 02 saturation is 97% on room air.   AKI (acute kidney injury) (HCC) CKD stage 4.   Catherine had aggressive diuresis, her peak serum cr reached 2,36, at the time of her discharge her serum cr is 1,96 with K at 4,5 and serum  bicarbonate at 33.  Plan to continue diuresis with torsemide at home.   Essential hypertension Continue blood pressure control with metoprolol   CAD (coronary artery disease) No chest pain, continue with antiplatelet therapy/   Persistent atrial fibrillation (HCC) Continue rate control with metoprolol and continue anticoagulation with apixaban.   Type 2 diabetes mellitus with hyperlipidemia (HCC) Her glucose remained well controlled during her hospitalization, she received insulin sliding scale and basal insulin.  At the time of her discharge her serum fasting glucose is 131 mg/dl.   Continue statin therapy with atorvastatin   Seizure ([place]) No active seizures  Continue with keppra and as needed lorazepam.   Chronic pain disorder Continue with analgesics Catherine very weak and debilitated, at home uses walker and wheelchair for mobility. Poor prognosis.   COPD (chronic obstructive pulmonary disease) (HCC) No clinical signs of exacerbation Catherine has chronic hypoxemic respiratory failure with supplemental 02 at home Continue bronchodilator therapy.   DNR (do not resuscitate) DNR          Consultants: cardiology  Procedures performed: none   Disposition: Home Diet recommendation:  Cardiac and Carb modified diet DISCHARGE MEDICATION: Allergies as of 10/28/2022   No Known Allergies      Medication List     STOP taking these medications    empagliflozin 25 MG Tabs tablet Commonly known as: JARDIANCE   nitroGLYCERIN 0.4 MG SL tablet Commonly known as: NITROSTAT   potassium chloride 10 MEQ tablet Commonly known as: KLOR-CON   sodium bicarbonate 650 MG tablet   valsartan 80 MG tablet Commonly known as: Diovan   Vitamin D (Ergocalciferol) 1.25 MG (50000 UNIT) Caps capsule Commonly known as: DRISDOL       TAKE these medications    apixaban 2.5 MG Tabs tablet Commonly known as: ELIQUIS Take 1 tablet (2.5 mg total) by mouth 2 (two) times daily.    ascorbic acid 1000 MG tablet Commonly known as: VITAMIN C Take 1 tablet (1,000 mg total) by mouth daily.   atorvastatin 40 MG tablet Commonly known as: LIPITOR Take 1 tablet (40 mg total) by mouth daily.   clopidogrel 75 MG tablet Commonly known as: PLAVIX Take 1 tablet (75 mg total) by mouth every evening.   cyanocobalamin 1000 MCG/ML injection Commonly known as: VITAMIN B12 INJECT 1ML IN THE MUSCLE EVERY 30 DAYS AS DIRECTED What changed: See the new instructions.   docusate sodium 100 MG capsule Commonly known as: COLACE Take 1 capsule (100 mg total) by mouth 2 (two) times daily.   fluticasone furoate-vilanterol 200-25 MCG/ACT Aepb Commonly known as: BREO ELLIPTA Inhale 1 puff into the lungs as needed (wheezing SOB).   isosorbide mononitrate 30 MG 24 hr tablet Commonly known as: IMDUR Take 1 tablet (30 mg total) by mouth daily.   letrozole 2.5 MG tablet Commonly known as: FEMARA Take 1 tablet (2.5 mg total) by mouth daily.   levETIRAcetam 500 MG tablet Commonly known as: Keppra Take 1 tablet (500 mg total) by mouth 2 (two) times daily.   LORazepam 0.5 MG tablet Commonly known as: ATIVAN Take 1 tablet (0.5 mg total) by mouth every 6 (six) hours as needed  for anxiety.   melatonin 3 MG Tabs tablet Take 1 tablet (3 mg total) by mouth at bedtime.   methocarbamol 500 MG tablet Commonly known as: ROBAXIN Take 1 tablet (500 mg total) by mouth every 4 (four) hours as needed for muscle spasms.   metoprolol succinate 25 MG 24 hr tablet Commonly known as: TOPROL-XL Take 0.5 tablets (12.5 mg total) by mouth daily. Take with or immediately following a meal.   multivitamin with minerals Tabs tablet Take 1 tablet by mouth daily.   oxyCODONE 5 MG immediate release tablet Commonly known as: Oxy IR/ROXICODONE Take 1 tablet (5 mg total) by mouth every 4 (four) hours as needed for moderate pain.   polyethylene glycol 17 g packet Commonly known as: MIRALAX / GLYCOLAX Take  17 g by mouth daily.   psyllium 95 % Pack Commonly known as: HYDROCIL/METAMUCIL Take 1 packet by mouth daily.   torsemide 20 MG tablet Commonly known as: DEMADEX Take 2 tablets (40 mg total) by mouth daily.   Toujeo Max SoloStar 300 UNIT/ML Solostar Pen Generic drug: insulin glargine (2 Unit Dial) Inject 6 Units into the skin at bedtime. What changed:  how much to take when to take this additional instructions        Follow-up Information     [place]. Go in 11 day(s).   Specialty: Cardiology Why: Hospital follow up PLEASE bring a current medication list to appointment FREE valet parking, Entrance C, off [employer] information: [id number] [place] [address] [phone]        Isaac Bliss, Rayford Halsted, MD. Go on 11/12/2022.   Specialty: Internal Medicine Why: '@10'$ :30am Contact information: [place] [address] [phone]                Discharge Exam: Filed Weights   10/26/22 0300 10/27/22 0121 10/28/22 0437  Weight: 63.1 kg 65 kg 63.3 kg   BP (!) 142/60   Pulse 64   Temp 98.3 F (36.8 C) (Oral)   Resp 20   Ht '5\' 2"'$  (1.575 m)   Wt 63.3 kg   SpO2 97%   BMI 25.52 kg/m   Catherine with improvement in dyspnea and edema  Neurology awake and alert ENT with positive pallor Cardiovascular with S1 and S2 present, with no gallops, rubs or murmurs Respiratory with no rales or wheezing Abdomen with no distention No lower extremity edema   Condition at discharge: stable  The results of significant diagnostics from this hospitalization (including imaging, microbiology, ancillary and laboratory) are listed below for reference.   Imaging Studies: IR THORACENTESIS ASP PLEURAL SPACE W/IMG GUIDE  Result Date: 10/27/2022 INDICATION: Catherine with history of CHF. Found to have a right sided pleural effusion. Request is for  therapeutic and diagnostic right-sided thoracentesis EXAM: ULTRASOUND GUIDED THERAPEUTIC AND DIAGNOSTIC RIGHT-SIDED THORACENTESIS MEDICATIONS: Lidocaine 1% 10 mL COMPLICATIONS: None immediate. PROCEDURE: An ultrasound guided thoracentesis was thoroughly discussed with the Catherine and questions answered. The benefits, risks, alternatives and complications were also discussed. The Catherine understands and wishes to proceed with the procedure. Written consent was obtained. Ultrasound was performed to localize and mark an adequate pocket of fluid in the right chest. The area was then prepped and draped in the normal sterile fashion. 1% Lidocaine was used for local anesthesia. Under ultrasound guidance a 6 Fr Safe-T-Centesis catheter was introduced. Thoracentesis was performed. The catheter was removed and a dressing applied. FINDINGS: A total  of approximately 1 L of [name]-colored fluid was removed. Samples were sent to the laboratory as requested by the clinical team. IMPRESSION: Successful ultrasound guided therapeutic and diagnostic right-sided thoracentesis yielding 1 L of pleural fluid. Read by: Rushie Nyhan, NP Electronically Signed   By: Corrie Mckusick D.O.   On: 10/27/2022 11:38   DG Chest 1 View  Result Date: 10/27/2022 CLINICAL DATA:  RIGHT-sided thoracentesis. EXAM: CHEST  1 VIEW COMPARISON:  10/18/2022 FINDINGS: Interval reduction in RIGHT pleural fluid. No pneumothorax appreciated. Port in the anterior chest wall with tip in distal SVC. Small LEFT effusion additionally. No pulmonary edema. IMPRESSION: 1. No pneumothorax following RIGHT thoracentesis. 2. Bilateral pleural effusions Electronically Signed   By: Suzy Bouchard M.D.   On: 10/27/2022 10:33   DG Chest 2 View  Result Date: 10/26/2022 CLINICAL DATA:  CHF EXAM: CHEST - 2 VIEW COMPARISON:  CXR 10/18/22 FINDINGS: Right-sided needle access port in place with the tip near the cavoatrial junction. There are small bilateral pleural  effusions. Compared to prior exam there is interval increase in hazy opacity at the right lung base, which could represent a combination of atelectasis and a layering pleural effusion. Visualized upper abdomen is unremarkable. Compared to prior exam there is slightly increased prominence of the right lower lobe lobar pulmonary artery. Cardiac and mediastinal contours are otherwise unchanged. No displaced rib fracture. No pneumothorax. Degenerative changes of the bilateral AC and glenohumeral joints. Vertebral body heights are maintained. IMPRESSION: 1.  Small bilateral pleural effusions, increased from prior. 2. Increased prominence of the right lower lobe lobar pulmonary artery, nonspecific. Electronically Signed   By: Marin Roberts M.D.   On: 10/26/2022 12:00   DG CHEST PORT 1 VIEW  Result Date: 10/18/2022 CLINICAL DATA:  Dyspnea.  History of CHF EXAM: PORTABLE CHEST 1 VIEW COMPARISON:  10/16/2022 FINDINGS: Right Port-A-Cath tip at superior caval/atrial junction. Midline trachea. Mild cardiomegaly. Small right pleural effusion is unchanged. No pneumothorax. No congestive failure. Right greater than left base airspace disease is similar. IMPRESSION: 1. No significant change in right greater than left base atelectasis with small right pleural effusion. Given chronicity of right effusion, recommend radiographic follow-up until clearing to exclude an underlying obstructive mass. 2.   Cardiomegaly without congestive failure. Electronically Signed   By: Abigail Miyamoto M.D.   On: 10/18/2022 08:46   US RENAL  Result Date: 10/16/2022 CLINICAL DATA:  AK I EXAM: RENAL / URINARY TRACT ULTRASOUND COMPLETE COMPARISON:  Renal ultrasound dated 08/31/2022 FINDINGS: Right Kidney: Renal measurements: 10.3 x 5.2 x 4.0 cm = volume: 109.8 mL (previously 160 ml). Echogenicity is slightly increased. No hydronephrosis visualized. Within the right kidney there is a 1.4 cm anechoic lesion, compatible with a simple renal cyst, and  unchanged from prior exam. Left Kidney: Renal measurements: 9.3 x 4.6 x 4.3 cm = volume: 95.5 mL (previously 145 ml). Echogenicity is slightly increased. No hydronephrosis visualized. Within left kidney, there are newly visualized anechoic lesions along the upper pole measuring up to 1.1 cm and 0.7 cm, which is also likely represent renal cysts and likely poorly visualized on prior exam due to poor visualization of the left kidney. Bladder: Appears normal for degree of bladder distention. Other: Incidentally noted are bilateral pleural effusions and possible small amount of perihepatic ascites. IMPRESSION: 1. No hydronephrosis or nephrolithiasis. 2. Findings suggestive of medical renal disease. 3. Incidentally noted are small bilateral pleural effusions and possible perihepatic ascites, new from prior. Electronically Signed   By: Marin Roberts  M.D.   On: 10/16/2022 17:41   ECHOCARDIOGRAM COMPLETE  Result Date: 10/16/2022    ECHOCARDIOGRAM REPORT   [name] Date of Exam: 10/16/2022 Medical Rec #:  [id number]     Height:       62.0 in Accession #:    [id number]    Weight:       172.2 lb Date of Birth:  Aug 28, 1941     BSA:          1.794 m Catherine Age:    44 years      BP:           131/66 mmHg Catherine Gender: F             HR:           50 bpm. Exam Location:  Inpatient Procedure: 2D Echo, 3D Echo, Cardiac Doppler and Color Doppler Indications:    I50.40* Unspecified combined systolic (congestive) and diastolic                 (congestive) heart failure  History:        Catherine has prior history of Echocardiogram examinations, most                 recent 04/17/2022. CHF, CAD, Abnormal ECG, TIA, Aortic Valve                 Disease, Arrythmias:Atrial Fibrillation and Atrial Flutter,                 Signs/Symptoms:Chest Pain; Risk Factors:Hypertension,                 Dyslipidemia and Diabetes. Aortic stenosis. Breast cancer.                 Seizure.  Sonographer:    Roseanna Rainbow RDCS Referring Phys:  [id number] JENNIFER YATES  Sonographer Comments: Technically difficult study due to poor echo windows. Image acquisition challenging due to Catherine body habitus. IMPRESSIONS  1. Left ventricular ejection fraction, by estimation, is 60 to 65%. The left ventricle has normal function. The left ventricle has no regional wall motion abnormalities. There is mild left ventricular hypertrophy. Left ventricular diastolic parameters are indeterminate.  2. Right ventricular systolic function is normal. The right ventricular size is normal. There is mildly elevated pulmonary artery systolic pressure.  3. Left atrial size was moderately dilated.  4. The mitral valve is degenerative. Mild mitral valve regurgitation. No evidence of mitral stenosis. Severe mitral annular calcification.  5. Tricuspid valve regurgitation is moderate to severe.  6. The aortic valve is normal in structure. There is severe calcifcation of the aortic valve. There is severe thickening of the aortic valve. Aortic valve regurgitation is not visualized. Mild to moderate aortic valve stenosis.  7. The inferior vena cava is normal in size with greater than 50% respiratory variability, suggesting right atrial pressure of 3 mmHg. FINDINGS  Left Ventricle: Left ventricular ejection fraction, by estimation, is 60 to 65%. The left ventricle has normal function. The left ventricle has no regional wall motion abnormalities. The left ventricular internal cavity size was normal in size. There is  mild left ventricular hypertrophy. Left ventricular diastolic parameters are indeterminate. Right Ventricle: The right ventricular size is normal. No increase in right ventricular wall thickness. Right ventricular systolic function is normal. There is mildly elevated pulmonary artery systolic pressure. The tricuspid regurgitant velocity is 2.87  m/s, and with an assumed right atrial pressure of 8  mmHg, the estimated right ventricular systolic pressure is 08.6 mmHg. Left Atrium: Left  atrial size was moderately dilated. Right Atrium: Right atrial size was normal in size. Pericardium: Trivial pericardial effusion is present. The pericardial effusion is posterior to the left ventricle. Mitral Valve: The mitral valve is degenerative in appearance. There is severe thickening of the mitral valve leaflet(s). There is severe calcification of the mitral valve leaflet(s). Severe mitral annular calcification. Mild mitral valve regurgitation. No evidence of mitral valve stenosis. Tricuspid Valve: The tricuspid valve is normal in structure. Tricuspid valve regurgitation is moderate to severe. No evidence of tricuspid stenosis. Aortic Valve: The aortic valve is normal in structure. There is severe calcifcation of the aortic valve. There is severe thickening of the aortic valve. Aortic valve regurgitation is not visualized. Mild to moderate aortic stenosis is present. Aortic valve mean gradient measures 12.0 mmHg. Aortic valve peak gradient measures 21.3 mmHg. Aortic valve area, by VTI measures 1.01 cm. Pulmonic Valve: The pulmonic valve was normal in structure. Pulmonic valve regurgitation is trivial. No evidence of pulmonic stenosis. Aorta: The aortic root is normal in size and structure. Venous: The inferior vena cava is normal in size with greater than 50% respiratory variability, suggesting right atrial pressure of 3 mmHg. IAS/Shunts: The interatrial septum appears to be lipomatous. No atrial level shunt detected by color flow Doppler.  LEFT VENTRICLE PLAX 2D LVIDd:         4.00 cm LVIDs:         2.90 cm LV PW:         1.30 cm LV IVS:        1.30 cm LVOT diam:     1.70 cm     3D Volume EF: LV SV:         57          3D EF:        51 % LV SV Index:   32          LV EDV:       125 ml LVOT Area:     2.27 cm    LV ESV:       62 ml                            LV SV:        64 ml  LV Volumes (MOD) LV vol d, MOD A2C: 83.5 ml LV vol d, MOD A4C: 54.1 ml LV vol s, MOD A2C: 16.6 ml LV vol s, MOD A4C: 16.4 ml LV SV  MOD A2C:     66.9 ml LV SV MOD A4C:     54.1 ml LV SV MOD BP:      49.8 ml RIGHT VENTRICLE            IVC RV S prime:     8.05 cm/s  IVC diam: 2.20 cm TAPSE (M-mode): 1.9 cm LEFT ATRIUM             Index        RIGHT ATRIUM           Index LA diam:        4.30 cm 2.40 cm/m   RA Area:     13.60 cm LA Vol (A2C):   60.2 ml 33.56 ml/m  RA Volume:   29.70 ml  16.56 ml/m LA Vol (A4C):   64.7 ml 36.07 ml/m LA Biplane Vol: 62.4 ml  34.79 ml/m  AORTIC VALVE AV Area (Vmax):    0.90 cm AV Area (Vmean):   0.88 cm AV Area (VTI):     1.01 cm AV Vmax:           230.80 cm/s AV Vmean:          159.800 cm/s AV VTI:            0.567 m AV Peak Grad:      21.3 mmHg AV Mean Grad:      12.0 mmHg LVOT Vmax:         91.80 cm/s LVOT Vmean:        62.300 cm/s LVOT VTI:          0.252 m LVOT/AV VTI ratio: 0.44  AORTA Ao Root diam: 3.00 cm Ao Asc diam:  3.00 cm MITRAL VALVE                TRICUSPID VALVE MV Area (PHT): 4.24 cm     TR Peak grad:   32.9 mmHg MV Decel Time: 179 msec     TR Vmax:        287.00 cm/s MV E velocity: 118.00 cm/s                             SHUNTS                             Systemic VTI:  0.25 m                             Systemic Diam: 1.70 cm Jenkins Rouge MD Electronically signed by Jenkins Rouge MD Signature Date/Time: 10/16/2022/3:54:03 PM    Final    DG Chest 2 View  Result Date: 10/16/2022 CLINICAL DATA:  Shortness of breath EXAM: CHEST - 2 VIEW COMPARISON:  08/29/2022 FINDINGS: Redemonstrated right chest port with catheter tip overlying the superior cavoatrial junction. Cardiomegaly. Increased right pleural effusion, now moderate, with associated atelectasis. Trace left pleural effusion. No pneumothorax. No acute osseous abnormality. IMPRESSION: 1. Increased right pleural effusion, now moderate, with associated atelectasis. 2. Trace left pleural effusion. Electronically Signed   By: Merilyn Baba M.D.   On: 10/16/2022 03:15    Microbiology: Results for orders placed or performed during the  hospital encounter of 10/16/22  Culture, body fluid w Gram Stain-bottle     Status: None (Preliminary result)   Collection Time: 10/27/22 10:26 AM   Specimen: Pleura  Result Value Ref Range Status   Specimen Description PLEURAL  Final   Special Requests NONE  Final   Culture   Final    NO GROWTH < 24 HOURS Performed at [facility] [address] [place], [place] [address]    Report Status PENDING  Incomplete  Gram stain     Status: None   Collection Time: 10/27/22 10:26 AM   Specimen: Pleura  Result Value Ref Range Status   Specimen Description PLEURAL  Final   Special Requests NONE  Final   Gram Stain   Final    NO ORGANISMS SEEN NO WBC SEEN Performed at [facility], [place] [address], [place], [place] [address]    Report Status 10/27/2022 FINAL  Final   *Note: Due to a large number of results and/or encounters for the requested time period, some results have not  been displayed. A complete set of results can be found in Results Review.    Labs: CBC: Recent Labs  Lab 10/22/22 0354 10/25/22 0335  WBC 4.2 3.9*  HGB 8.6* 8.9*  HCT 26.1* 27.8*  MCV 96.0 95.5  PLT 93* 90*   Basic Metabolic Panel: Recent Labs  Lab 10/22/22 0354 10/23/22 0412 10/24/22 0330 10/25/22 0335 10/26/22 0215 10/27/22 0318 10/28/22 0436  NA 142   < > 143 143 141 142 142  K 3.7   < > 3.7 3.9 4.4 4.4 4.5  CL 109   < > 103 101 97* 100 97*  CO2 25   < > 29 32 34* 33* 33*  GLUCOSE 125*   < > 102* 90 141* 189* 131*  BUN 72*   < > 69* 65* 63* 64* 61*  CREATININE 1.93*   < > 1.67* 1.88* 1.91* 2.36* 1.96*  CALCIUM 6.8*   < > 7.6* 7.7* 7.7* 7.8* 7.8*  MG 2.1  --  1.8  --   --  2.0 2.0   < > = values in this interval not displayed.   Liver Function Tests: No results for input(s): "AST", "ALT", "ALKPHOS", "BILITOT", "PROT", "ALBUMIN" in the last 168 hours. CBG: Recent Labs  Lab 10/27/22 1127 10/27/22 1602 10/27/22 2109 10/28/22 0607 10/28/22 1054  GLUCAP 285* 229* 108* 136*  238*    Discharge time spent: greater than 30 minutes.  Signed: Tawni Millers, MD [facility] 10/28/2022

## 2022-10-28 NOTE — Progress Notes (Signed)
   Heart Failure Stewardship Pharmacist Progress Note   PCP: Isaac Bliss, Rayford Halsted, MD PCP-Cardiologist: Mertie Moores, MD    HPI:  [name] is an 81 year old female with a PMH of CHF, NSTEMI s/p PCI to RCA, atrial flutter, AML, COPD, CKD, hypertension, hyperlipidemia and hepatic failure who presented with worsening edema and shortness of breath. She was recently discharged from rehab, reports symptom worsening despite adherence to diuretic regimen. In 08/2017, echocardiogram showed LVEF of 40-45% which has since improved to 60-65% on echocardiogram from 04/2022. Patient has previously tried Delene Loll, but it was stopped for 'itching.' AKI present on admission. Renal function improved with diuresis initially, but increase to 2.36 on 11/20.   Current HF Medications: Beta Blocker: metoprolol succinate 12.5 mg daily  Prior to admission HF Medications: Diuretic: torsemide 40 mg daily  Beta blocker: metoprolol succinate 12.5 mg daily ACE/ARB/ARNI: valsartan 80 mg daily SGLT2i: Jardiance 25 mg daily Other: Imdur 30 mg daily  Pertinent Lab Values: Serum creatinine 1.96, BUN 64, Potassium 4.5, Sodium 142, BNP 1198.5 on 11/9 down to 792 on 11/19, Magnesium 2, A1c 9.9   Vital Signs: Weight: 139.55 lbs (admission weight: 172.2 lbs) Blood pressure: 130-150/50s Heart rate: 70s  I/O: -0.9L yesterday, net -7.8L this admission (inaccurate)  Medication Assistance / Insurance Benefits Check: Does the patient have prescription insurance?  Yes Type of insurance plan: Medicare  Does the patient qualify for medication assistance through manufacturers or grants?   Pending Eligible grants and/or patient assistance programs: pending Medication assistance applications in progress: pending  Medication assistance applications approved: pending Approved medication assistance renewals will be completed by: pending  Outpatient Pharmacy:  Prior to admission outpatient pharmacy: [facility] Is the  patient willing to use [facility] at discharge? Yes Is the patient willing to transition their outpatient pharmacy to utilize a [facility] outpatient pharmacy?   Pending   Assessment: 1. Acute on chronic diastolic HFimpEF (LVEF 54-65%) due to ICM. NYHA class III symptoms. - Continue strict I/Os along with daily weights. Maintain K >4 and Mg >2. Weight is significantly down from admission. Peripheral edema has resolved. Patient reports no repiratory issues this morning.  - Creatinine is trending back down after holding diuresis. AKI likely related to intravascular volume depletion. Can consider resuming medications if AKI improves prior to discharge or at follow-up.  Plan: 1) Medication changes recommended at this time: - Hold diuresis again today  2) Patient assistance: - Pending  3)  Education  - Initial education done. To be completed prior to discharge  Thank you for allowing pharmacy to participate in this patient's care.  Reatha Harps, PharmD PGY2 Pharmacy Resident 10/28/2022 5:31 AM Check [email or web address] for unit specific pharmacy number

## 2022-10-28 NOTE — Progress Notes (Signed)
Nutrition Follow-up  DOCUMENTATION CODES:   Not applicable  INTERVENTION:  Encourage adequate PO intake Ensure Enlive po BID, each supplement provides 350 kcal and 20 grams of protein.  NUTRITION DIAGNOSIS:   Increased nutrient needs related to chronic illness (COPD, CHF, CKD) as evidenced by estimated needs.  Ongoing  GOAL:   Patient will meet greater than or equal to 90% of their needs  Goal met via meal intake  MONITOR:   PO intake, Weight trends, I & O's  REASON FOR ASSESSMENT:   Consult Diet education (Heart Failure diet education)  ASSESSMENT:   81 y.o. female with PMH AML (remission), COPD, CKD, CAD, DMII, HTN, HLD, CHF who presented with edema. Noted to have recent hip fx.  11/20 s/p thoracentesis- yield 1L fluid  Plans for discharge today or tomorrow.   Spoke with pt at bedside. She reports doing well today. She endorses having a good appetite and has been continuing to eat at least 90-100% of all meals.   Admit weight: 78.1 kg Current weight: 63.3 kg  Medications: colace, SSI 0-9 units TID, levemir 8 units daily, melatonin, miralax, psyllium  Labs: BUN 61, Cr 1.96, Ca 7.8, GFR 25, CBG's 108-285 x24 hours  Edema: mild pitting BLE  UOP: 925m x24 hours +2575mx12 hours I/O's:-13L since admit  Diet Order:   Diet Order             Diet - low sodium heart healthy           Diet heart healthy/carb modified Room service appropriate? Yes; Fluid consistency: Thin; Fluid restriction: 1500 mL Fluid  Diet effective now                   EDUCATION NEEDS:   Education needs have been addressed  Skin:  Skin Assessment: Reviewed RN Assessment  Last BM:  11/14  Height:   Ht Readings from Last 1 Encounters:  10/16/22 _0  (1.575 m)    Weight:   Wt Readings from Last 1 Encounters:  10/28/22 63.3 kg    Ideal Body Weight:  50 kg  BMI:  Body mass index is 25.52 kg/m.  Estimated Nutritional Needs:   Kcal:  1700-1900  Protein:  80-100  grams  Fluid:  >/= 1.7L  AlClayborne DanaRDN, LDN Clinical Nutrition

## 2022-10-28 NOTE — TOC Progression Note (Addendum)
Transition of Care [facility]) - Progression Note    Patient Details  Name: [name] MRN: [id number] Date of Birth: 1941-04-23  Transition of [facility]) CM/SW Contact  Zenon Mayo, RN Phone Number: 10/28/2022, 9:59 AM  Clinical Narrative:    Lasix on hold secondary to elevated cret, TOC following. Will let Workmens Comp rep know when she is ready for dc.  Will fax dx summary to [phone].  Per MD she will be for dc today.     Expected Discharge Plan: [place] Barriers to Discharge: Continued Medical Work up  Expected Discharge Plan and Services Expected Discharge Plan: [place]   Discharge Planning Services: CM Consult Post Acute Care Choice: [place] arrangements for the past 2 months: Single Family Home                           HH Arranged: PT, OT, RN, Nurse's Aide [place] Agency: Other - See comment (Through [employer] comp insurance- Forensic scientist) Date [place]: 10/17/22 Time Regino Ramirez: [id number] Representative spoke with at [place]: [name] and Lanney Gins   Social Determinants of Health (SDOH) Interventions Food Insecurity Interventions: Intervention Not Indicated Housing Interventions: Intervention Not Indicated Transportation Interventions: Intervention Not Indicated Utilities Interventions: Intervention Not Indicated Alcohol Usage Interventions: Intervention Not Indicated (Score <7) Financial Strain Interventions: Intervention Not Indicated  Readmission Risk Interventions     No data to display

## 2022-10-28 NOTE — TOC Transition Note (Addendum)
Transition of Care [facility]) - CM/SW Discharge Note   Patient Details  Name: BRIGHID KOCH MRN: [id number] Date of Birth: 08-24-1941  Transition of Care [facility]) CM/SW Contact:  Zenon Mayo, RN Phone Number: 10/28/2022, 10:07 AM   Clinical Narrative:    Patient is for dc today, NCM notified Stacie the [address] with [facility].  They will be providing a HHRN,HHAIDE for patient.   75- Per MD patient states she feels weak so he will plan to dc her tomorrow.  NCM notified [name].  NCM also scanned dc summary to [place] at [email or web address].  Erline Levine states if she goes home tomorrow the [facility]  Merchant navy officer) agency may not have any one to come out to see her on Thanksgiving. NCM also notified [place] and they informed NCM of the same thing that they will not have anyone that can come out on the Holiday, Also [employer] will be covering for her tomorrow her phone number is [phone] and her email is [email or web address].      Final next level of care: [place] Barriers to Discharge: No Barriers Identified   Patient Goals and CMS Choice Patient states their goals for this hospitalization and ongoing recovery are:: return home   Choice offered to / list presented to : Patient, Adult Children (Daughter, [name])  Discharge Placement                       Discharge Plan and Services   Discharge Planning Services: CM Consult Post Acute Care Choice: Home Health                    HH Arranged: RN, Nurse's Aide [place] Agency:  Merchant navy officer) Date HH Agency Contacted: 10/28/22 Time HH Agency Contacted: 1007 Representative spoke with at [place]: [place]  Social Determinants of Health (SDOH) Interventions Food Insecurity Interventions: Intervention Not Indicated Housing Interventions: Intervention Not Indicated Transportation Interventions: Intervention Not Indicated Utilities Interventions:  Intervention Not Indicated Alcohol Usage Interventions: Intervention Not Indicated (Score <7) Financial Strain Interventions: Intervention Not Indicated   Readmission Risk Interventions     No data to display

## 2022-10-29 ENCOUNTER — Other Ambulatory Visit: Payer: Self-pay | Admitting: Physical Medicine and Rehabilitation

## 2022-10-29 DIAGNOSIS — R569 Unspecified convulsions: Secondary | ICD-10-CM

## 2022-10-29 LAB — BASIC METABOLIC PANEL
Anion gap: 8 (ref 5–15)
BUN: 59 mg/dL — ABNORMAL HIGH (ref 8–23)
CO2: 32 mmol/L (ref 22–32)
Calcium: 7.6 mg/dL — ABNORMAL LOW (ref 8.9–10.3)
Chloride: 99 mmol/L (ref 98–111)
Creatinine, Ser: 2.01 mg/dL — ABNORMAL HIGH (ref 0.44–1.00)
GFR, Estimated: 25 mL/min — ABNORMAL LOW (ref >60)
Glucose, Bld: 123 mg/dL — ABNORMAL HIGH (ref 70–99)
Potassium: 4.4 mmol/L (ref 3.5–5.1)
Sodium: 139 mmol/L (ref 135–145)

## 2022-10-29 LAB — GLUCOSE, CAPILLARY
Glucose-Capillary: 119 mg/dL — ABNORMAL HIGH (ref 70–99)
Glucose-Capillary: 340 mg/dL — ABNORMAL HIGH (ref 70–99)

## 2022-10-29 MED ORDER — CYANOCOBALAMIN 1000 MCG/ML IJ SOLN
1000.0000 ug | Freq: Once | INTRAMUSCULAR | Status: AC
  Administered 2022-10-29: 1000 ug via INTRAMUSCULAR
  Filled 2022-10-29: qty 1

## 2022-10-29 MED ORDER — HEPARIN SOD (PORK) LOCK FLUSH 100 UNIT/ML IV SOLN
500.0000 [IU] | INTRAVENOUS | Status: AC | PRN
  Administered 2022-10-29: 500 [IU]

## 2022-10-29 MED ORDER — LEVETIRACETAM 500 MG PO TABS: 500.0000 mg | ORAL_TABLET | Freq: Two times a day (BID) | ORAL | 0 refills | Status: DC

## 2022-10-29 NOTE — Consult Note (Signed)
   [facility] CM Inpatient Consult   10/29/2022  EMMRY HINSCH [birth year] [id number]   Follow up note:  LLOS 13 days   Discussed for potential transitioning home with [facility] set up with Worker's comp.  Plan:  Patient will have follow up for readmission prevention for extreme high risk score for unplanned readmission risk needs with the Transition of Care follow up with PCP as patient states she is followed by the [facility] Comp team also. She states no SDOH needs for food, transportation or medication at current review.  For questions please contact:  [name], RN BSN [place]  [phone] business mobile phone Toll free office [phone]  *[place]  [phone] Fax number: [phone] [email or web address]

## 2022-10-29 NOTE — Progress Notes (Signed)
Patient states that she takes cyanocobalamin inj every month and it is due now. She would like it before she is discharged on 11/22 so that she will not miss her dose D/T the holidays.

## 2022-10-29 NOTE — Progress Notes (Signed)
   Heart Failure Stewardship Pharmacist Progress Note   PCP: Isaac Bliss, Rayford Halsted, MD PCP-Cardiologist: Mertie Moores, MD    HPI:  [name] is an 81 year old female with a PMH of CHF, NSTEMI s/p PCI to RCA, atrial flutter, AML, COPD, CKD, hypertension, hyperlipidemia and hepatic failure who presented with worsening edema and shortness of breath. She was recently discharged from rehab, reports symptom worsening despite adherence to diuretic regimen. In 08/2017, echocardiogram showed LVEF of 40-45% which has since improved to 60-65% on echocardiogram from 04/2022. Patient has previously tried Delene Loll, but it was stopped for 'itching.' AKI present on admission.   Discharge HF Medications: Diuretic: torsemide 40 mg daily (40 mg BID if weight gain) Beta Blocker: metoprolol succinate 12.5 mg daily Other: Imdur 30 mg daily  Prior to admission HF Medications: Diuretic: torsemide 40 mg daily  Beta blocker: metoprolol succinate 12.5 mg daily ACE/ARB/ARNI: valsartan 80 mg daily SGLT2i: Jardiance 25 mg daily Other: Imdur 30 mg daily  Pertinent Lab Values: Serum creatinine 2.01, BUN 59, Potassium 4.4, Sodium 139, BNP 1198.5 on 11/9 down to 792 on 11/19, Magnesium 2.0, A1c 9.9   Vital Signs: Weight: 139 lbs (admission weight: 172.2 lbs) Blood pressure: 110-150/50s Heart rate: 70s  I/O: -0.8L yesterday, net -13.3L this admission (inaccurate)  Medication Assistance / Insurance Benefits Check: Does the patient have prescription insurance?  Yes Type of insurance plan: Medicare  Outpatient Pharmacy:  Prior to admission [facility] pharmacy[facility] Is the patient willing to use [facility] at discharge? Yes Is the patient willing to transition their outpatient pharmacy to utilize a [facility] outpatient pharmacy?   Pending   Assessment: 1. Acute on chronic diastolic HFimpEF (LVEF 30-13%) due to ICM. NYHA class II symptoms. - Continue strict I/Os along with daily weights.  Maintain K >4 and Mg >2. Weight is significantly down from admission. Peripheral edema has resolved. Agree with resuming torsemide on discharge. - Can consider resuming GDMT if AKI improves at follow-up.  Plan: 1) Medication changes recommended at this time: - Hold diuresis again today  2) Patient assistance: - None pending  3)  Education  - Patient has been educated on current HF medications and potential additions to HF medication regimen - Patient verbalizes understanding that over the next few months, these medication doses may change and more medications may be added to optimize HF regimen - Patient has been educated on basic disease state pathophysiology and goals of therapy  Kerby Nora, PharmD, BCPS Heart Failure Stewardship Pharmacist Phone [phone]

## 2022-10-29 NOTE — Progress Notes (Signed)
Occupational Therapy Treatment Patient Details Name: [name] MRN: [id number] DOB: 01/08/1941 Today's Date: 10/29/2022   History of present illness The pt is an 81 yo female presenting 11/9 with SOB and LE edema, found to have CHF exacerbation. Pt with recent admission for hip fx, d/c from inpatient rehab 10/26. PMH includes: arthritis, cardiomyopathy, CKD IV, CHF, COPD, CAD, DM II, HLD, HTN, and NSTEMI.   OT comments  Pt progressing towards goals this session, completing ADLs with mod I - min guard A, supervision for bed mobility and min guard for transfers with RW. Pt VSS throughout session, and continues to demonstrate good use of sock aid administered in previous session. Pt presenting with impairments listed below, will follow acutely. Continue to recommend HHOT at d/c.   Recommendations for follow up therapy are one component of a multi-disciplinary discharge planning process, led by the attending physician.  Recommendations may be updated based on patient status, additional functional criteria and insurance authorization.    Follow Up Recommendations  Home health OT     Assistance Recommended at Discharge Set up Supervision/Assistance  Patient can return home with the following  A little help with walking and/or transfers;A little help with bathing/dressing/bathroom;Assistance with cooking/housework;Help with stairs or ramp for entrance;Assist for transportation   Equipment Recommendations  None recommended by OT    Recommendations for Other Services PT consult    Precautions / Restrictions Precautions Precautions: Fall Precaution Comments: monitor SpO2 to maintain >92%, hx of seizure disorder, no R LE ROM restrictions Restrictions Weight Bearing Restrictions: No RLE Weight Bearing: Weight bearing as tolerated       Mobility Bed Mobility Overal bed mobility: Needs Assistance Bed Mobility: Supine to Sit     Supine to sit: Supervision, HOB elevated           Transfers Overall transfer level: Needs assistance Equipment used: Rolling walker (2 wheels) Transfers: Sit to/from Stand Sit to Stand: Min guard                 Balance Overall balance assessment: Needs assistance Sitting-balance support: No upper extremity supported, Feet supported Sitting balance-Leahy Scale: Good Sitting balance - Comments: able to reach outside BOS without LOB   Standing balance support: Bilateral upper extremity supported, During functional activity, No upper extremity supported Standing balance-Leahy Scale: Fair Standing balance comment: can static stand without AD                           ADL either performed or assessed with clinical judgement   ADL Overall ADL's : Needs assistance/impaired                     Lower Body Dressing: Supervision/safety;With adaptive equipment;Sitting/lateral leans Lower Body Dressing Details (indicate cue type and reason): with sock aid Toilet Transfer: Min guard;Rolling walker (2 wheels)   Toileting- Clothing Manipulation and Hygiene: Modified independent;Sitting/lateral lean Toileting - Clothing Manipulation Details (indicate cue type and reason): pericare     Functional mobility during ADLs: Min guard;Rolling walker (2 wheels)      Extremity/Trunk Assessment Upper Extremity Assessment Upper Extremity Assessment: Generalized weakness   Lower Extremity Assessment Lower Extremity Assessment: Defer to PT evaluation        Vision   Vision Assessment?: No apparent visual deficits   Perception Perception Perception: Not tested   Praxis Praxis Praxis: Not tested    Cognition Arousal/Alertness: Awake/alert Behavior During Therapy: [facility] for tasks assessed/performed Overall Cognitive  Status: Within Functional Limits for tasks assessed                                          Exercises      Shoulder Instructions       General Comments VSS on RA     Pertinent Vitals/ Pain       Pain Assessment Pain Assessment: No/denies pain  Home Living                                          Prior Functioning/Environment              Frequency  Min 2X/week        Progress Toward Goals  OT Goals(current goals can now be found in the care plan section)  Progress towards OT goals: Progressing toward goals  Acute Rehab OT Goals Patient Stated Goal: none stated OT Goal Formulation: With patient Time For Goal Achievement: 10/31/22 Potential to Achieve Goals: Good ADL Goals Pt Will Perform Upper Body Bathing: with set-up Pt Will Perform Lower Body Bathing: Independently;sitting/lateral leans;sit to/from stand Pt Will Perform Upper Body Dressing: with set-up Pt Will Perform Lower Body Dressing: Independently;sit to/from stand;sitting/lateral leans Pt Will Transfer to Toilet: Independently Pt Will Perform Toileting - Clothing Manipulation and hygiene: Independently Pt Will Perform Tub/Shower Transfer: with min guard assist Additional ADL Goal #1: Patient will be able to verbalize 3 strategies for energy conservation and fall prevention for safe discharge home. Additional ADL Goal #2: Patient will be able to state 3 strategies for managing CHF levels to prevent further hospital admissions.  Plan Discharge plan remains appropriate    Co-evaluation                 AM-PAC OT "6 Clicks" Daily Activity     Outcome Measure   Help from another person eating meals?: None Help from another person taking care of personal grooming?: A Little Help from another person toileting, which includes using toliet, bedpan, or urinal?: A Little Help from another person bathing (including washing, rinsing, drying)?: A Little Help from another person to put on and taking off regular upper body clothing?: None Help from another person to put on and taking off regular lower body clothing?: A Little 6 Click Score: 20    End  of Session Equipment Utilized During Treatment: Gait belt;Rolling walker (2 wheels)  OT Visit Diagnosis: Unsteadiness on feet (R26.81);Other abnormalities of gait and mobility (R26.89);Muscle weakness (generalized) (M62.81)   Activity Tolerance Patient tolerated treatment well   Patient Left in chair;with call bell/phone within reach;with chair alarm set   Nurse Communication Mobility status        Time: 7741-2878 OT Time Calculation (min): 22 min  Charges: OT General Charges $OT Visit: 1 Visit OT Treatments $Self Care/Home Management : 8-22 mins  Renaye Rakers, OTD, OTR/L SecureChat Preferred Acute Rehab [phone] - [place] 10/29/2022, 10:59 AM

## 2022-11-01 LAB — CULTURE, BODY FLUID W GRAM STAIN -BOTTLE: Culture: NO GROWTH

## 2022-11-03 ENCOUNTER — Telehealth: Payer: Self-pay | Admitting: *Deleted

## 2022-11-03 NOTE — Telephone Encounter (Signed)
1st attempt to call the patient TCM

## 2022-11-04 ENCOUNTER — Telehealth: Payer: Self-pay

## 2022-11-04 ENCOUNTER — Encounter (HOSPITAL_COMMUNITY): Payer: Self-pay

## 2022-11-04 ENCOUNTER — Ambulatory Visit (HOSPITAL_COMMUNITY)
Admit: 2022-11-04 | Discharge: 2022-11-04 | Disposition: A | Discharge status: Home / Self Care | Payer: Medicare HMO | Attending: Physician Assistant | Admitting: Physician Assistant

## 2022-11-04 VITALS — BP 116/58 | HR 63 | Wt 143.2 lb

## 2022-11-04 DIAGNOSIS — I130 Hypertensive heart and chronic kidney disease with heart failure and stage 1 through stage 4 chronic kidney disease, or unspecified chronic kidney disease: Secondary | ICD-10-CM | POA: Insufficient documentation

## 2022-11-04 DIAGNOSIS — I5032 Chronic diastolic (congestive) heart failure: Secondary | ICD-10-CM | POA: Insufficient documentation

## 2022-11-04 DIAGNOSIS — Z955 Presence of coronary angioplasty implant and graft: Secondary | ICD-10-CM | POA: Diagnosis not present

## 2022-11-04 DIAGNOSIS — I359 Nonrheumatic aortic valve disorder, unspecified: Secondary | ICD-10-CM

## 2022-11-04 DIAGNOSIS — Z7902 Long term (current) use of antithrombotics/antiplatelets: Secondary | ICD-10-CM | POA: Insufficient documentation

## 2022-11-04 DIAGNOSIS — Z853 Personal history of malignant neoplasm of breast: Secondary | ICD-10-CM | POA: Insufficient documentation

## 2022-11-04 DIAGNOSIS — I4891 Unspecified atrial fibrillation: Secondary | ICD-10-CM | POA: Insufficient documentation

## 2022-11-04 DIAGNOSIS — I2510 Atherosclerotic heart disease of native coronary artery without angina pectoris: Secondary | ICD-10-CM | POA: Diagnosis not present

## 2022-11-04 DIAGNOSIS — C9201 Acute myeloblastic leukemia, in remission: Secondary | ICD-10-CM | POA: Diagnosis not present

## 2022-11-04 DIAGNOSIS — J90 Pleural effusion, not elsewhere classified: Secondary | ICD-10-CM | POA: Diagnosis not present

## 2022-11-04 DIAGNOSIS — N1832 Chronic kidney disease, stage 3b: Secondary | ICD-10-CM | POA: Diagnosis not present

## 2022-11-04 DIAGNOSIS — I4819 Other persistent atrial fibrillation: Secondary | ICD-10-CM

## 2022-11-04 DIAGNOSIS — R42 Dizziness and giddiness: Secondary | ICD-10-CM | POA: Diagnosis not present

## 2022-11-04 DIAGNOSIS — Z79899 Other long term (current) drug therapy: Secondary | ICD-10-CM | POA: Insufficient documentation

## 2022-11-04 DIAGNOSIS — Z9889 Other specified postprocedural states: Secondary | ICD-10-CM | POA: Insufficient documentation

## 2022-11-04 LAB — BASIC METABOLIC PANEL
Anion gap: 16 — ABNORMAL HIGH (ref 5–15)
BUN: 77 mg/dL — ABNORMAL HIGH (ref 8–23)
CO2: 22 mmol/L (ref 22–32)
Calcium: 6.5 mg/dL — ABNORMAL LOW (ref 8.9–10.3)
Chloride: 100 mmol/L (ref 98–111)
Creatinine, Ser: 3.56 mg/dL — ABNORMAL HIGH (ref 0.44–1.00)
GFR, Estimated: 12 mL/min — ABNORMAL LOW (ref >60)
Glucose, Bld: 270 mg/dL — ABNORMAL HIGH (ref 70–99)
Potassium: 4.1 mmol/L (ref 3.5–5.1)
Sodium: 138 mmol/L (ref 135–145)

## 2022-11-04 LAB — MISC LABCORP TEST (SEND OUT): Labcorp test code: 9985

## 2022-11-04 LAB — BRAIN NATRIURETIC PEPTIDE: B Natriuretic Peptide: 560.3 pg/mL — ABNORMAL HIGH (ref 0.0–100.0)

## 2022-11-04 NOTE — Telephone Encounter (Signed)
Called to confirm Heart & Vascular Transitions of Care appointment at 2 pm on 11/04/22. Patient reminded to bring all medications and pill box organizer with them. Confirmed patient has transportation. Gave directions, instructed to utilize [place] parking.  Confirmed appointment prior to ending call.    Earnestine Leys, BSN, Clinical cytogeneticist Only

## 2022-11-04 NOTE — H&P (View-Only) (Signed)
  HEART & VASCULAR TRANSITION OF CARE NOTE     Referring Physician: Dr. Joseph Primary Care: Hernandez Acosta, Estela Y, MD Primary Cardiologist: Philip Nahser, MD   HPI: Referred to clinic by Dr. Joseph with [facility] for heart failure consultation. 81 y.o. 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  Admitted 5/23 with a/c diastolic CHF in setting of new AF.  Denied CP. HS trop 16>>15. Echo EF 60-65%, RV normal.  Placed on Eliquis and metoprolol. Spontaneously converted to SR. She was diuresed with IV lasix then transitioned to PO torsemide. D/c wt 150 lb. Seen in TOC for follow-up. She was mildly volume up. Entresto increased. She was referred back to Cardiology.  Patient readmitted in September 2023 with hip fracutre after a fall and underwent surgical intervention. Coarse c/b urinary retention and AKI. Diuretics held but later restarted. Discharged to CIR and eventually returned home on 10/01/22.  Readmission 10/16/22 with acute on chronic diastolic CHF and pleural effusion. Had been dealing with lower extremity edema during prior admission. This continued to worsen after discharge despite taking diuretics. Diuresed with IV lasix and had thoracentesis for R pleural effusion. Course c/b mild AKI. SGLT2i and ARB held at discharge. Discharged on 40 mg Torsemide daily, weight 139 lb.   Echo 11/23: EF 60-65%, RV okay, moderate LAE, mild MR, severe MAC, moderate to severe TR, severely calcified aortic valve with mild to moderate AS (mean gradient 12 mmHg, AVA by VTI 1.01 cm2)  Here today for hospital follow-up. Has been doing well. Has been getting around with a walker since her hip fracture in September. Strength gradually  improving but can't walk far distances. Weight has been stable around 139 lb since discharge. Taking all meds as prescribed. No dyspnea, orthopnea, PND or lower extremity edema. Notes intermittent lightheadedness with position changes.  Lives at home with her husband. Manages her own meds. 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    Review of Systems: Cardiac/Respiratory. Negative except as mentioned in HPI   Past Medical History:  Diagnosis Date   Acute myelogenous leukemia (HCC) 02/2014   Acute pancreatitis    Arthritis    "back" (09/17/2017)   Atrial flutter (HCC)    Cardiomyopathy (HCC)    CHF (congestive heart failure) (HCC)    Chronic kidney disease, stage II (mild)    Chronic lower back pain    Colon polyps 04/29/2010   TUBULAR ADENOMA AND A SERRATED ADENOMA   COPD (chronic obstructive pulmonary disease) (HCC)    CORONARY ARTERY DISEASE 12/24/2007   DIABETES MELLITUS, TYPE II 07/13/2007   History of blood transfusion 2015   "related to leukemia"   History of kidney stones 12/24/2007   History of uterine cancer    HYPERLIPIDEMIA 12/24/2007   HYPERTENSION 07/13/2007   NSTEMI (non-ST elevated myocardial infarction) (HCC) 09/17/2017   Uterine cancer (HCC)     Current Outpatient Medications  Medication Sig Dispense Refill   apixaban (ELIQUIS) 2.5 MG TABS tablet Take 1 tablet (2.5 mg total) by mouth 2 (two) times daily. 60 tablet 0   ascorbic acid (VITAMIN C) 1000 MG tablet Take 1 tablet (1,000 mg total) by mouth daily. 30 tablet   0   atorvastatin (LIPITOR) 40 MG tablet Take 1 tablet (40 mg total) by mouth daily. 30 tablet 0   clopidogrel (PLAVIX) 75 MG tablet Take 1 tablet (75 mg total) by mouth every evening. 30 tablet 0   cyanocobalamin (VITAMIN B12) 1000 MCG/ML injection INJECT 1ML IN THE MUSCLE EVERY 30 DAYS AS DIRECTED (Patient taking differently: Inject 1,000 mcg into the muscle every 30 (thirty) days.) 3 mL 3    docusate sodium (COLACE) 100 MG capsule Take 1 capsule (100 mg total) by mouth 2 (two) times daily. 10 capsule 0   fluticasone furoate-vilanterol (BREO ELLIPTA) 200-25 MCG/ACT AEPB Inhale 1 puff into the lungs as needed (wheezing SOB). 1 each 0   insulin glargine, 2 Unit Dial, (TOUJEO MAX SOLOSTAR) 300 UNIT/ML Solostar Pen Inject 6 Units into the skin at bedtime. (Patient taking differently: Inject 10-16 Units into the skin See admin instructions. Inject 10 units into the skin at supper and 16 units at bedtime) 15 mL 0   isosorbide mononitrate (IMDUR) 30 MG 24 hr tablet Take 1 tablet (30 mg total) by mouth daily. 30 tablet 0   letrozole (FEMARA) 2.5 MG tablet Take 1 tablet (2.5 mg total) by mouth daily. 90 tablet 3   levETIRAcetam (KEPPRA) 500 MG tablet TAKE 1 TABLET(500 MG) BY MOUTH TWICE DAILY 180 tablet 0   LORazepam (ATIVAN) 0.5 MG tablet Take 1 tablet (0.5 mg total) by mouth every 6 (six) hours as needed for anxiety. 30 tablet 0   melatonin 3 MG TABS tablet Take 1 tablet (3 mg total) by mouth at bedtime. (Patient taking differently: Take 3 mg by mouth at bedtime. As needed) 30 tablet 0   methocarbamol (ROBAXIN) 500 MG tablet Take 1 tablet (500 mg total) by mouth every 4 (four) hours as needed for muscle spasms. 60 tablet 0   metoprolol succinate (TOPROL-XL) 25 MG 24 hr tablet Take 0.5 tablets (12.5 mg total) by mouth daily. Take with or immediately following a meal. 30 tablet 0   Multiple Vitamin (MULTIVITAMIN WITH MINERALS) TABS tablet Take 1 tablet by mouth daily.     oxyCODONE (OXY IR/ROXICODONE) 5 MG immediate release tablet Take 1 tablet (5 mg total) by mouth every 4 (four) hours as needed for moderate pain. 30 tablet 0   polyethylene glycol (MIRALAX / GLYCOLAX) 17 g packet Take 17 g by mouth daily. 14 each 0   psyllium (HYDROCIL/METAMUCIL) 95 % PACK Take 1 packet by mouth daily. 240 each    torsemide (DEMADEX) 20 MG tablet Take 2 tablets (40 mg total) by mouth daily. Take 2 tablets twice a  day in case of weight gain 2 to 3 lbs in 24 hrs or 5 lbs in 7 days. 60 tablet 0   No current facility-administered medications for this encounter.   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  Tobacco Use   Smoking status: Former      Packs/day: 1.00    Years: 20.00    Total pack years: 20.00    Types: Cigarettes    Quit date: 12/08/2000    Years since quitting: 21.9   Smokeless tobacco: Never  Vaping Use   Vaping Use: Never used  Substance and Sexual Activity   Alcohol use: No    Alcohol/week: 0.0 standard drinks of alcohol   Drug use: No   Sexual activity: Never  Other Topics Concern   Not on file  Social History Narrative   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   Social Determinants of Health   Financial Resource Strain: Low Risk  (10/22/2022)   Overall Financial Resource Strain (CARDIA)    Difficulty of Paying Living Expenses: Not very hard  Food Insecurity: No Food Insecurity (11/04/2022)   Hunger Vital Sign    Worried About Running Out of Food in the Last Year: Never true    Ran Out of Food in the Last Year: Never true  Transportation Needs: No Transportation Needs (11/04/2022)   PRAPARE - Transportation    Lack of Transportation (Medical): No    Lack of Transportation (Non-Medical): No  Physical Activity: Not on file  Stress: No Stress Concern Present (05/12/2022)   [employer] - Occupational Stress  Questionnaire    Feeling of Stress : Not at all  Social Connections: Not on file  Intimate Partner Violence: Not At Risk (10/16/2022)   Humiliation, Afraid, Rape, and Kick questionnaire    Fear of Current or Ex-Partner: No    Emotionally Abused: No    Physically Abused: No    Sexually Abused: No      Family History  Problem Relation Age of Onset   COPD Father    Esophageal cancer Father    Breast cancer Sister 50   Lung cancer Sister    Esophageal cancer Brother    Suicidality Brother    Lung cancer Sister    Lung cancer Sister    Cervical cancer Sister    Congestive Heart Failure Mother    Heart attack Maternal Grandmother    Skin cancer Maternal Grandfather    Heart attack Maternal Grandfather    Stroke Paternal Grandmother 58   Heart attack Paternal Grandfather    Cancer Maternal Uncle        unknown type, dx. >65   Cancer Paternal Aunt        unknown type, dx. >50   Cancer Paternal Uncle        unknown type, dx. >50   Cirrhosis Maternal Uncle    Skin cancer Daughter    Leukemia Cousin        maternal first cousin; dx. >50, in remission   Cancer Niece        unknown type behind her ear, dx. 30s/40s   Colon cancer Neg Hx     Vitals:   11/04/22 1416  BP: (!) 116/58  Pulse: 63  SpO2: 98%  Weight: 65 kg (143 lb 3.2 oz)     PHYSICAL EXAM: General:  Elderly female, ambulated into clinic with a walker HEENT: normal Neck: supple. no JVD. Carotids 2+ bilat; no bruits. Cor: PMI nondisplaced. Regular rate & rhythm. No rubs, gallops, 3/6 AS murmur Lungs: clear Abdomen: soft, nontender, nondistended.  Extremities: no cyanosis, clubbing, rash, edema Neuro: alert & orientedx3, cranial nerves grossly intact. moves all 4 extremities w/o difficulty. Affect pleasant   ECG: Afib 65 bpm   ASSESSMENT & PLAN: HFmEF with recovered   EF: - Echo 2018 EF down 40-45% - Echo 5/23 EF 60-65%, normal RV, mild-mod AS - Echo 11/23 (reviewed by Dr. Sabharwal): EF 60-65%, RV okay,  moderate LAE, severe MAC with mild MR, moderate TR, mild to moderate AS with severe calcification of aortic valve and unknown density on subvalvular surface - NYHA Class III, confounded by functional impairment following recent hip fracture. Volume okay on exam. Continue 40 mg Torsemide daily. - Did not tolerate entresto due to itching - Jardiance and Valsartan stopped during recent admit d/t AKI. Consider adding back if renal function improved on today's labs but need to be cautious given intermittent orthostatic dizziness. - Check BMP and BNP today   2. CAD: - multivessel CAD (not CABG candidate) s/p PCI to RCA in 2018 - stable w/o CP  - on statin + ? blocker + imdur - no ASA w/ Eliquis use. Has been on clopidogrel as single antiplatelet per Cardiology.  3. Hx AFL/Afib: - Looks like she has been back in afib since at least September.  - Rate controlled.  - Continue Toprol XL 12.5 mg daily - Eliquis 2.5 mg bid (reduced dose given age and renal fx). Denies abnormal bleeding    4. Malignancy (H/o AML treated with doxorubicin, Breast cancer) - now in remission - oncology following   5. CKD IIIb: - Baseline Scr 1.5-1, AKI during recent admit with Scr up to 2.36  - Labs today  6. Aortic valve disorder - Echo during recent admit reviewed by Dr. Sabharwal. EF preserved, severely calcified aortic valve with mild to moderate AS and an unknown density on subvalvular surface - Unable to assess with cMRI d/t nailing in hip - Recommend TEE to better assess. This will be arranged in next few weeks  7. R pleural effusion - S/p thoracentesis during recent admit - No malignant cells on cytology  NYHA II GDMT  Diuretic- Torsemide 40 mg daily  BB- Toprol XL 12.5 mg daily  Ace/ARB/ARNI - No, recently stopped d/t AKI and orthostatic dizziness MRA hold off SGLT2i- No, recently stopped d/t AKI and orthostatic dizziness Add back GDMT pending labs  Referred to HFSW (PCP, Medications,  Transportation, ETOH Abuse, Drug Abuse, Insurance, Financial ):  No  Refer to Pharmacy:  No Refer to Home Health: No Refer to Advanced Heart Failure Clinic: Pending TEE Refer to General Cardiology: No, already established and has f/u scheduled  Follow up  TEE with Dr. Sabharwal 11/19/22 and f/u 12/15/22 (if need to keep), keep f/u with Cardiology on 11/28/22 

## 2022-11-04 NOTE — Patient Instructions (Addendum)
Thank you for letting us provide your medical care today at the [facility] clinic. See below for changes that may have been adjusted at your appointment.  No medication changes today. You had labwork done today --we will only call you with abnormal blood results.   You are scheduled for TEE on December 13th at 11:30  Please arrive at 10:30. Nothing by mouth prior to your TEE procedure except medications.  Please come to Admitting --entrance A prior to procedure.  Your physician has requested that you have a TEE. During a TEE, sound waves are used to create images of your heart. It provides your doctor with information about the size and shape of your heart and how well your heart's chambers and valves are working. In this test, a transducer is attached to the end of a flexible tube that's guided down your throat and into your esophagus (the tube leading from you mouth to your stomach) to get a more detailed image of your heart. You are not awake for the procedure. Please see the instruction sheet given to you today. For further information please visit [email or web address].   Special Instructions // Education:  Do the following things EVERYDAY: Weigh yourself in the morning before breakfast. Write it down and keep it in a log. Take your medicines as prescribed Eat low salt foods--Limit salt (sodium) to 2000 mg per day.  Stay as active as you can everyday Limit all fluids for the day to less than 2 liters  Follow-Up in: Dr Daniel Nones on January 8th at 2pm.

## 2022-11-04 NOTE — Patient Outreach (Signed)
  Care Coordination TOC Note Transition Care Management Follow-up Telephone Call Date of discharge and from where: 10/29/22-[facility] Dx: Acute on chronic HF How have you been since you were released from the hospital? Call completed with daughter/caregiver-Penny. She reports patient is doing well. She was able to go to the grocery store the other day. Patient is determined to return back to work as soon as possible. Any questions or concerns? No  Items Reviewed: Did the pt receive and understand the discharge instructions provided? Yes  Medications obtained and verified?  Unable to review meds-daughter does not have med list or meds with her at present Other? Yes  Any new allergies since your discharge? No  Dietary orders reviewed? Yes Do you have support at home? Yes   Home Care and Equipment/Supplies: Were home health services ordered? yes If so, what is the name of the agency? [facility]  Has the agency set up a time to come to the patient's home? yes Were any new equipment or medical supplies ordered?  No What is the name of the medical supply agency? N/A Were you able to get the supplies/equipment? not applicable Do you have any questions related to the use of the equipment or supplies? No  Functional Questionnaire: (I = Independent and D = Dependent) ADLs: I  Bathing/Dressing- I  Meal Prep- I  Eating- I  Maintaining continence- I  Transferring/Ambulation- I  Managing Meds- I  Follow up appointments reviewed:  PCP Hospital f/u appt confirmed? Yes  Scheduled to see Dr. Isaac Bliss on 11/12/22 @ [facility] f/u appt confirmed? Yes  Scheduled to see [facility] on today. Are transportation arrangements needed? No  If their condition worsens, is the pt aware to call PCP or go to the Emergency Dept.? Yes Was the patient provided with contact information for the PCP's office or ED? Yes Was to pt encouraged to call back with questions or  concerns? Yes  SDOH assessments and interventions completed:   Yes  Care Coordination Interventions Activated:  Yes   Care Coordination Interventions:  Education provided    Encounter Outcome:  Pt. Visit Completed    Enzo Montgomery, RN,BSN,CCM [facility] Telephonic Care Management Coordinator Direct Phone: [phone] Toll Free: [phone] Fax: [phone]

## 2022-11-04 NOTE — Progress Notes (Cosign Needed Addendum)
HEART & VASCULAR TRANSITION OF CARE NOTE     Referring Physician: Dr. Broadus John Primary Care: Isaac Bliss, Rayford Halsted, MD Primary Cardiologist: Mertie Moores, MD   HPI: Referred to clinic by Dr. Broadus John with [facility] for heart failure consultation. 81 y.o. 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  Admitted 1/30 with a/c diastolic CHF in setting of new AF.  Denied CP. HS trop 16>>15. Echo EF 60-65%, RV normal.  Placed on Eliquis and metoprolol. Spontaneously converted to SR. She was diuresed with IV lasix then transitioned to PO torsemide. D/c wt 150 lb. Seen in [facility] for follow-up. She was mildly volume up. Entresto increased. She was referred back to Cardiology.  Patient readmitted in September 2023 with hip fracutre after a fall and underwent surgical intervention. Coarse c/b urinary retention and AKI. Diuretics held but later restarted. Discharged to CIR and eventually returned home on 10/01/22.  Readmission 10/16/22 with acute on chronic diastolic CHF and pleural effusion. Had been dealing with lower extremity edema during prior admission. This continued to worsen after discharge despite taking diuretics. Diuresed with IV lasix and had thoracentesis for R pleural effusion. Course c/b mild AKI. SGLT2i and ARB held at discharge. Discharged on 40 mg Torsemide daily, weight 139 lb.   Echo 11/23: EF 60-65%, RV okay, moderate LAE, mild MR, severe MAC, moderate to severe TR, severely calcified aortic valve with mild to moderate AS (mean gradient 12 mmHg, AVA by VTI 1.01 cm2)  Here today for hospital follow-up. Has been doing well. Has been getting around with a walker since her hip fracture in September. Strength gradually  improving but can't walk far distances. Weight has been stable around 139 lb since discharge. Taking all meds as prescribed. No dyspnea, orthopnea, PND or lower extremity edema. Notes intermittent lightheadedness with position changes.  Lives at home with her husband. Manages her own meds. 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    Review of Systems: Cardiac/Respiratory. Negative except as mentioned in HPI   Past Medical History:  Diagnosis Date   Acute myelogenous leukemia ([place]) 02/2014   Acute pancreatitis    Arthritis    "back" (09/17/2017)   Atrial flutter (HCC)    Cardiomyopathy (HCC)    CHF (congestive heart failure) (HCC)    Chronic kidney disease, stage II (mild)    Chronic lower back pain    Colon polyps 04/29/2010   TUBULAR ADENOMA AND A SERRATED ADENOMA   COPD (chronic obstructive pulmonary disease) ([place])    CORONARY ARTERY DISEASE 12/24/2007   DIABETES MELLITUS, TYPE II 07/13/2007   History of blood transfusion 2015   "related to leukemia"   History of kidney stones 12/24/2007   History of uterine cancer    HYPERLIPIDEMIA 12/24/2007   HYPERTENSION 07/13/2007   NSTEMI (non-ST elevated myocardial infarction) ([place]) 09/17/2017   Uterine cancer (HCC)     Current Outpatient Medications  Medication Sig Dispense Refill   apixaban (ELIQUIS) 2.5 MG TABS tablet Take 1 tablet (2.5 mg total) by mouth 2 (two) times daily. 60 tablet 0   ascorbic acid (VITAMIN C) 1000 MG tablet Take 1 tablet (1,000 mg total) by mouth daily. 30 tablet  0   atorvastatin (LIPITOR) 40 MG tablet Take 1 tablet (40 mg total) by mouth daily. 30 tablet 0   clopidogrel (PLAVIX) 75 MG tablet Take 1 tablet (75 mg total) by mouth every evening. 30 tablet 0   cyanocobalamin (VITAMIN B12) 1000 MCG/ML injection INJECT 1ML IN THE MUSCLE EVERY 30 DAYS AS DIRECTED (Patient taking differently: Inject 1,000 mcg into the muscle every 30 (thirty) days.) 3 mL 3    docusate sodium (COLACE) 100 MG capsule Take 1 capsule (100 mg total) by mouth 2 (two) times daily. 10 capsule 0   fluticasone furoate-vilanterol (BREO ELLIPTA) 200-25 MCG/ACT AEPB Inhale 1 puff into the lungs as needed (wheezing SOB). 1 each 0   insulin glargine, 2 Unit Dial, (TOUJEO MAX SOLOSTAR) 300 UNIT/ML Solostar Pen Inject 6 Units into the skin at bedtime. (Patient taking differently: Inject 10-16 Units into the skin See admin instructions. Inject 10 units into the skin at supper and 16 units at bedtime) 15 mL 0   isosorbide mononitrate (IMDUR) 30 MG 24 hr tablet Take 1 tablet (30 mg total) by mouth daily. 30 tablet 0   letrozole (FEMARA) 2.5 MG tablet Take 1 tablet (2.5 mg total) by mouth daily. 90 tablet 3   levETIRAcetam (KEPPRA) 500 MG tablet TAKE 1 TABLET(500 MG) BY MOUTH TWICE DAILY 180 tablet 0   LORazepam (ATIVAN) 0.5 MG tablet Take 1 tablet (0.5 mg total) by mouth every 6 (six) hours as needed for anxiety. 30 tablet 0   melatonin 3 MG TABS tablet Take 1 tablet (3 mg total) by mouth at bedtime. (Patient taking differently: Take 3 mg by mouth at bedtime. As needed) 30 tablet 0   methocarbamol (ROBAXIN) 500 MG tablet Take 1 tablet (500 mg total) by mouth every 4 (four) hours as needed for muscle spasms. 60 tablet 0   metoprolol succinate (TOPROL-XL) 25 MG 24 hr tablet Take 0.5 tablets (12.5 mg total) by mouth daily. Take with or immediately following a meal. 30 tablet 0   Multiple Vitamin (MULTIVITAMIN WITH MINERALS) TABS tablet Take 1 tablet by mouth daily.     oxyCODONE (OXY IR/ROXICODONE) 5 MG immediate release tablet Take 1 tablet (5 mg total) by mouth every 4 (four) hours as needed for moderate pain. 30 tablet 0   polyethylene glycol (MIRALAX / GLYCOLAX) 17 g packet Take 17 g by mouth daily. 14 each 0   psyllium (HYDROCIL/METAMUCIL) 95 % PACK Take 1 packet by mouth daily. 240 each    torsemide (DEMADEX) 20 MG tablet Take 2 tablets (40 mg total) by mouth daily. Take 2 tablets twice a  day in case of weight gain 2 to 3 lbs in 24 hrs or 5 lbs in 7 days. 60 tablet 0   No current facility-administered medications for this encounter.   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  (10/22/2022)   Overall Financial Resource Strain (CARDIA)    Difficulty of Paying Living Expenses: Not very hard  Food Insecurity: No Food Insecurity (11/04/2022)   Hunger Vital Sign    Worried About Running Out of Food in the Last Year: Never true    [place] in the Last Year: Never true  Transportation Needs: No Transportation Needs (11/04/2022)   PRAPARE - Hydrologist (Medical): No    Lack of Transportation (Non-Medical): No  Physical Activity: Not on file  Stress: No Stress Concern Present (05/12/2022)   [place]    Feeling of Stress : Not at all  Social Connections: Not on file  Intimate Partner Violence: Not At Risk (10/16/2022)   Humiliation, Afraid, Rape, and Kick questionnaire    Fear of Current or Ex-Partner: No    Emotionally Abused: No    Physically Abused: No    Sexually Abused: No      Family History  Problem Relation Age of Onset   COPD Father    Esophageal cancer Father    Breast cancer Sister 54   Lung cancer Sister    Esophageal cancer Brother    Suicidality Brother    Lung cancer Sister    Lung cancer Sister    Cervical cancer Sister    Congestive Heart Failure Mother    Heart attack Maternal Grandmother    Skin cancer Maternal Grandfather    Heart attack Maternal Grandfather    Stroke Paternal Grandmother 24   Heart attack Paternal Grandfather    Cancer Maternal Uncle        unknown type, dx. >65   Cancer Paternal Aunt        unknown type, dx. >50   Cancer Paternal Uncle        unknown type, dx. >50   Cirrhosis Maternal Uncle    Skin cancer Daughter    Leukemia Cousin        maternal first cousin; dx. >50, in remission   Cancer Niece        unknown type behind her ear, dx. 30s/40s   Colon cancer Neg Hx     Vitals:   11/04/22 1416  BP: (!) 116/58  Pulse: 63  SpO2: 98%  Weight: 65 kg (143 lb 3.2 oz)     PHYSICAL EXAM: General:  Elderly female, ambulated into clinic with a walker HEENT: normal Neck: supple. no JVD. Carotids 2+ bilat; no bruits. Cor: PMI nondisplaced. Regular rate & rhythm. No rubs, gallops, 3/6 AS murmur Lungs: clear Abdomen: soft, nontender, nondistended.  Extremities: no cyanosis, clubbing, rash, edema Neuro: alert & orientedx3, cranial nerves grossly intact. moves all 4 extremities w/o difficulty. Affect pleasant   ECG: Afib 65 bpm   ASSESSMENT & PLAN: HFmEF with recovered  EF: - Echo 2018 EF down 40-45% - Echo 5/23 EF 60-65%, normal RV, mild-mod AS - Echo 11/23 (reviewed by Dr. Daniel Nones): EF 60-65%, RV okay,  moderate LAE, severe MAC with mild MR, moderate TR, mild to moderate AS with severe calcification of aortic valve and unknown density on subvalvular surface - NYHA Class III, confounded by functional impairment following recent hip fracture. Volume okay on exam. Continue 40 mg Torsemide daily. - Did not tolerate entresto due to itching - Jardiance and Valsartan stopped during recent admit d/t AKI. Consider adding back if renal function improved on today's labs but need to be cautious given intermittent orthostatic dizziness. - Check BMP and BNP today   2. CAD: - multivessel CAD (not CABG candidate) s/p PCI to RCA in 2018 - stable w/o CP  - on statin + ? blocker + imdur - no ASA w/ Eliquis use. Has been on clopidogrel as single antiplatelet per Cardiology.  3. Hx AFL/Afib: - Looks like she has been back in afib since at least September.  - Rate controlled.  - Continue Toprol XL 12.5 mg daily - Eliquis 2.5 mg bid (reduced dose given age and renal fx). Denies abnormal bleeding    4. Malignancy (H/o AML treated with doxorubicin, Breast cancer) - now in remission - oncology following   5. CKD IIIb: - Baseline Scr 1.5-1, AKI during recent admit with Scr up to 2.36  - Labs today  6. Aortic valve disorder - Echo during recent admit reviewed by Dr. Daniel Nones. EF preserved, severely calcified aortic valve with mild to moderate AS and an unknown density on subvalvular surface - Unable to assess with cMRI d/t nailing in hip - Recommend TEE to better assess. This will be arranged in next few weeks  7. R pleural effusion - S/p thoracentesis during recent admit - No malignant cells on cytology  NYHA II GDMT  Diuretic- Torsemide 40 mg daily  BB- Toprol XL 12.5 mg daily  Ace/ARB/ARNI - No, recently stopped d/t AKI and orthostatic dizziness MRA hold off SGLT2i- No, recently stopped d/t AKI and orthostatic dizziness Add back GDMT pending labs  Referred to HFSW (PCP, Medications,  Transportation, ETOH Abuse, Drug Abuse, Insurance, Financial ):  No  Refer to Pharmacy:  No Refer to Home Health: No Refer to Advanced Heart Failure Clinic: Pending TEE Refer to General Cardiology: No, already established and has f/u scheduled  Follow up  TEE with Dr. Daniel Nones 11/19/22 and f/u 12/15/22 (if need to keep), keep f/u with Cardiology on 11/28/22

## 2022-11-05 ENCOUNTER — Telehealth (HOSPITAL_COMMUNITY): Payer: Self-pay | Admitting: *Deleted

## 2022-11-05 DIAGNOSIS — Z79899 Other long term (current) drug therapy: Secondary | ICD-10-CM

## 2022-11-05 DIAGNOSIS — I5032 Chronic diastolic (congestive) heart failure: Secondary | ICD-10-CM

## 2022-11-05 NOTE — Telephone Encounter (Signed)
Called patient per Jeneen Rinks, PA with lab results and medication changes from yesterday's visit:  Kidney function worse with lower fluid on board. Patient could be dry. Stop Torsemide for now (patient has not taken today's dose). Repeat lab Friday, 11/07/22 at 12:30  Pt verbalized understanding of same.

## 2022-11-06 ENCOUNTER — Encounter (HOSPITAL_COMMUNITY): Payer: Self-pay

## 2022-11-06 ENCOUNTER — Emergency Department (HOSPITAL_COMMUNITY): Payer: Medicare HMO

## 2022-11-06 ENCOUNTER — Emergency Department (HOSPITAL_COMMUNITY)
Admission: EM | Admit: 2022-11-06 | Discharge: 2022-11-07 | Disposition: A | Discharge status: Home / Self Care | Payer: Medicare HMO | Attending: Emergency Medicine | Admitting: Emergency Medicine

## 2022-11-06 DIAGNOSIS — Z7901 Long term (current) use of anticoagulants: Secondary | ICD-10-CM | POA: Insufficient documentation

## 2022-11-06 DIAGNOSIS — E162 Hypoglycemia, unspecified: Secondary | ICD-10-CM | POA: Insufficient documentation

## 2022-11-06 DIAGNOSIS — E11649 Type 2 diabetes mellitus with hypoglycemia without coma: Secondary | ICD-10-CM | POA: Diagnosis not present

## 2022-11-06 DIAGNOSIS — J90 Pleural effusion, not elsewhere classified: Secondary | ICD-10-CM | POA: Diagnosis not present

## 2022-11-06 DIAGNOSIS — Z794 Long term (current) use of insulin: Secondary | ICD-10-CM | POA: Insufficient documentation

## 2022-11-06 LAB — COMPREHENSIVE METABOLIC PANEL
ALT: 13 U/L (ref 0–44)
AST: 17 U/L (ref 15–41)
Albumin: 3 g/dL — ABNORMAL LOW (ref 3.5–5.0)
Alkaline Phosphatase: 102 U/L (ref 38–126)
Anion gap: 17 — ABNORMAL HIGH (ref 5–15)
BUN: 93 mg/dL — ABNORMAL HIGH (ref 8–23)
CO2: 17 mmol/L — ABNORMAL LOW (ref 22–32)
Calcium: 5.9 mg/dL — CL (ref 8.9–10.3)
Chloride: 108 mmol/L (ref 98–111)
Creatinine, Ser: 4.33 mg/dL — ABNORMAL HIGH (ref 0.44–1.00)
GFR, Estimated: 10 mL/min — ABNORMAL LOW (ref >60)
Glucose, Bld: 43 mg/dL — CL (ref 70–99)
Potassium: 3.3 mmol/L — ABNORMAL LOW (ref 3.5–5.1)
Sodium: 142 mmol/L (ref 135–145)
Total Bilirubin: 0.5 mg/dL (ref 0.3–1.2)
Total Protein: 6 g/dL — ABNORMAL LOW (ref 6.5–8.1)

## 2022-11-06 LAB — CBC WITH DIFFERENTIAL/PLATELET
Abs Immature Granulocytes: 0.02 10*3/uL (ref 0.00–0.07)
Basophils Absolute: 0 10*3/uL (ref 0.0–0.1)
Basophils Relative: 0 %
Eosinophils Absolute: 0.2 10*3/uL (ref 0.0–0.5)
Eosinophils Relative: 2 %
HCT: 28.1 % — ABNORMAL LOW (ref 36.0–46.0)
Hemoglobin: 9.2 g/dL — ABNORMAL LOW (ref 12.0–15.0)
Immature Granulocytes: 0 %
Lymphocytes Relative: 17 %
Lymphs Abs: 1.4 10*3/uL (ref 0.7–4.0)
MCH: 31.7 pg (ref 26.0–34.0)
MCHC: 32.7 g/dL (ref 30.0–36.0)
MCV: 96.9 fL (ref 80.0–100.0)
Monocytes Absolute: 0.7 10*3/uL (ref 0.1–1.0)
Monocytes Relative: 8 %
Neutro Abs: 6.1 10*3/uL (ref 1.7–7.7)
Neutrophils Relative %: 73 %
Platelets: 216 10*3/uL (ref 150–400)
RBC: 2.9 MIL/uL — ABNORMAL LOW (ref 3.87–5.11)
RDW: 14 % (ref 11.5–15.5)
WBC: 8.5 10*3/uL (ref 4.0–10.5)
nRBC: 0 % (ref 0.0–0.2)

## 2022-11-06 LAB — CBG MONITORING, ED
Glucose-Capillary: 135 mg/dL — ABNORMAL HIGH (ref 70–99)
Glucose-Capillary: 38 mg/dL — CL (ref 70–99)
Glucose-Capillary: 41 mg/dL — CL (ref 70–99)
Glucose-Capillary: 118 mg/dL — ABNORMAL HIGH (ref 70–99)
Glucose-Capillary: 116 mg/dL — ABNORMAL HIGH (ref 70–99)

## 2022-11-06 MED ORDER — DEXTROSE 50 % IV SOLN
50.0000 mL | Freq: Once | INTRAVENOUS | Status: AC
  Administered 2022-11-06: 50 mL via INTRAVENOUS
  Filled 2022-11-06: qty 50

## 2022-11-06 MED ORDER — CALCIUM GLUCONATE-NACL 1-0.675 GM/50ML-% IV SOLN
1.0000 g | Freq: Once | INTRAVENOUS | Status: AC
  Administered 2022-11-06: 1000 mg via INTRAVENOUS
  Filled 2022-11-06: qty 50

## 2022-11-06 NOTE — ED Triage Notes (Signed)
Felt bad yesterday. Really bad today. Sister checked her pt's blood sugar today at 17:00 - it was 39. Ate a sugar cake. Repeat glucose 112.

## 2022-11-06 NOTE — ED Provider Notes (Addendum)
[facility] EMERGENCY DEPARTMENT Provider Note   CSN: [id number] Arrival date & time: 11/06/22  2024     History  Chief Complaint  Patient presents with   Hypoglycemia    [name] is a 81 y.o. female.  The history is provided by the patient.  Hypoglycemia Initial blood sugar:  38 Blood sugar after intervention:  135 Severity:  Mild Onset quality:  Sudden Timing:  Constant Progression:  Unchanged Chronicity:  New Context: decreased oral intake   Relieved by:  Nothing Ineffective treatments:  None tried Associated symptoms: decreased responsiveness   Risk factors comment:  DM, CHF, HTN, HLD      Home Medications Prior to Admission medications   Medication Sig Start Date End Date Taking? Authorizing Provider  apixaban (ELIQUIS) 2.5 MG TABS tablet Take 1 tablet (2.5 mg total) by mouth 2 (two) times daily. 09/30/22   Angiulli, Lavon Paganini, PA-C  ascorbic acid (VITAMIN C) 1000 MG tablet Take 1 tablet (1,000 mg total) by mouth daily. 09/30/22   Angiulli, Lavon Paganini, PA-C  atorvastatin (LIPITOR) 40 MG tablet Take 1 tablet (40 mg total) by mouth daily. 09/30/22   Angiulli, Lavon Paganini, PA-C  clopidogrel (PLAVIX) 75 MG tablet Take 1 tablet (75 mg total) by mouth every evening. 09/30/22   Angiulli, Lavon Paganini, PA-C  cyanocobalamin (VITAMIN B12) 1000 MCG/ML injection INJECT 1ML IN THE MUSCLE EVERY 30 DAYS AS DIRECTED Patient taking differently: Inject 1,000 mcg into the muscle every 30 (thirty) days. 08/28/22   Isaac Bliss, Rayford Halsted, MD  docusate sodium (COLACE) 100 MG capsule Take 1 capsule (100 mg total) by mouth 2 (two) times daily. 09/30/22   Angiulli, Lavon Paganini, PA-C  fluticasone furoate-vilanterol (BREO ELLIPTA) 200-25 MCG/ACT AEPB Inhale 1 puff into the lungs as needed (wheezing SOB). 09/30/22   Angiulli, Lavon Paganini, PA-C  insulin glargine, 2 Unit Dial, (TOUJEO MAX SOLOSTAR) 300 UNIT/ML Solostar Pen Inject 6 Units into the skin at bedtime. Patient taking  differently: Inject 10-16 Units into the skin See admin instructions. Inject 10 units into the skin at supper and 16 units at bedtime 09/05/22   Florencia Reasons, MD  isosorbide mononitrate (IMDUR) 30 MG 24 hr tablet Take 1 tablet (30 mg total) by mouth daily. 09/30/22   Angiulli, Lavon Paganini, PA-C  letrozole [facility]) 2.5 MG tablet Take 1 tablet (2.5 mg total) by mouth daily. 02/14/22   Owens Shark, NP  levETIRAcetam (KEPPRA) 500 MG tablet TAKE 1 TABLET(500 MG) BY MOUTH TWICE DAILY 10/29/22   Raulkar, Clide Deutscher, MD  LORazepam (ATIVAN) 0.5 MG tablet Take 1 tablet (0.5 mg total) by mouth every 6 (six) hours as needed for anxiety. 09/30/22   Angiulli, Lavon Paganini, PA-C  melatonin 3 MG TABS tablet Take 1 tablet (3 mg total) by mouth at bedtime. Patient taking differently: Take 3 mg by mouth at bedtime. As needed 09/30/22   Angiulli, Lavon Paganini, PA-C  methocarbamol (ROBAXIN) 500 MG tablet Take 1 tablet (500 mg total) by mouth every 4 (four) hours as needed for muscle spasms. 09/30/22   Angiulli, Lavon Paganini, PA-C  metoprolol succinate (TOPROL-XL) 25 MG 24 hr tablet Take 0.5 tablets (12.5 mg total) by mouth daily. Take with or immediately following a meal. 09/30/22   Angiulli, Lavon Paganini, PA-C  Multiple Vitamin (MULTIVITAMIN WITH MINERALS) TABS tablet Take 1 tablet by mouth daily. 09/30/22   Angiulli, Lavon Paganini, PA-C  oxyCODONE (OXY IR/ROXICODONE) 5 MG immediate release tablet Take 1 tablet (5 mg total)  by mouth every 4 (four) hours as needed for moderate pain. 09/30/22   Angiulli, Lavon Paganini, PA-C  polyethylene glycol (MIRALAX / GLYCOLAX) 17 g packet Take 17 g by mouth daily. 09/30/22   Angiulli, Lavon Paganini, PA-C  psyllium (HYDROCIL/METAMUCIL) 95 % PACK Take 1 packet by mouth daily. 09/30/22   Angiulli, Lavon Paganini, PA-C      Allergies    Patient has no known allergies.    Review of Systems   Review of Systems  Constitutional:  Positive for decreased responsiveness.    Physical Exam Updated Vital Signs  ED Triage Vitals   Enc Vitals Group     BP 11/06/22 2037 109/63     Pulse Rate 11/06/22 2037 80     Resp 11/06/22 2037 (!) 22     Temp 11/06/22 2037 (!) 97.3 F (36.3 C)     Temp src --      SpO2 11/06/22 2037 96 %     Weight --      Height --      Head Circumference --      Peak Flow --      Pain Score 11/06/22 2051 0     Pain Loc --      Pain Edu? --      Excl. in [place]? --     Physical Exam Vitals and nursing note reviewed.  Constitutional:      General: She is not in acute distress.    Appearance: She is well-developed. She is not ill-appearing.  HENT:     Head: Normocephalic and atraumatic.     Nose: Nose normal.     Mouth/Throat:     Mouth: Mucous membranes are moist.  Eyes:     Extraocular Movements: Extraocular movements intact.     Conjunctiva/sclera: Conjunctivae normal.     Pupils: Pupils are equal, round, and reactive to light.  Cardiovascular:     Rate and Rhythm: Normal rate and regular rhythm.     Pulses: Normal pulses.     Heart sounds: Normal heart sounds. No murmur heard. Pulmonary:     Effort: Pulmonary effort is normal. No respiratory distress.     Breath sounds: Normal breath sounds.  Abdominal:     Palpations: Abdomen is soft.     Tenderness: There is no abdominal tenderness.  Musculoskeletal:        General: No swelling.     Cervical back: Normal range of motion and neck supple.  Skin:    General: Skin is warm and dry.     Capillary Refill: Capillary refill takes less than 2 seconds.  Neurological:     General: No focal deficit present.     Mental Status: She is alert and oriented to person, place, and time.     Cranial Nerves: No cranial nerve deficit.     Sensory: No sensory deficit.     Motor: No weakness.     Coordination: Coordination normal.  Psychiatric:        Mood and Affect: Mood normal.     ED Results / Procedures / Treatments   Labs (all labs ordered are listed, but only abnormal results are displayed) Labs Reviewed  CBC WITH  DIFFERENTIAL/PLATELET - Abnormal; Notable for the following components:      Result Value   RBC 2.90 (*)    Hemoglobin 9.2 (*)    HCT 28.1 (*)    All other components within normal limits  COMPREHENSIVE METABOLIC PANEL - Abnormal; Notable for  the following components:   Potassium 3.3 (*)    CO2 17 (*)    Glucose, Bld 43 (*)    BUN 93 (*)    Creatinine, Ser 4.33 (*)    Calcium 5.9 (*)    Total Protein 6.0 (*)    Albumin 3.0 (*)    GFR, Estimated 10 (*)    Anion gap 17 (*)    All other components within normal limits  CBG MONITORING, ED - Abnormal; Notable for the following components:   Glucose-Capillary 38 (*)    All other components within normal limits  CBG MONITORING, ED - Abnormal; Notable for the following components:   Glucose-Capillary 41 (*)    All other components within normal limits  CBG MONITORING, ED - Abnormal; Notable for the following components:   Glucose-Capillary 135 (*)    All other components within normal limits  CBG MONITORING, ED - Abnormal; Notable for the following components:   Glucose-Capillary 118 (*)    All other components within normal limits  CBG MONITORING, ED - Abnormal; Notable for the following components:   Glucose-Capillary 116 (*)    All other components within normal limits  URINALYSIS, ROUTINE W REFLEX MICROSCOPIC  CBG MONITORING, ED    EKG None  Radiology DG Chest Portable 1 View  Result Date: 11/06/2022 CLINICAL DATA:  Hypoglycemia EXAM: PORTABLE CHEST 1 VIEW COMPARISON:  10/27/2022 FINDINGS: Single frontal view of the chest demonstrates a stable right chest wall port. The cardiac silhouette is enlarged. There is patchy consolidation at the left lung base, which may reflect airspace disease or atelectasis. Small bilateral pleural effusions are noted. No pneumothorax. No acute bony abnormalities. IMPRESSION: 1. Patchy left basilar consolidation which may reflect airspace disease or atelectasis. 2. Trace bilateral pleural  effusions. 3. Stable enlarged cardiac silhouette. Electronically Signed   By: Randa Ngo M.D.   On: 11/06/2022 21:33    Procedures .Critical Care  Performed by: Lennice Sites, DO Authorized by: Lennice Sites, DO   Critical care provider statement:    Critical care time (minutes):  40   Critical care was necessary to treat or prevent imminent or life-threatening deterioration of the following conditions:  Endocrine crisis   Critical care was time spent personally by me on the following activities:  Blood draw for specimens, development of treatment plan with patient or surrogate, evaluation of patient's response to treatment, examination of patient, obtaining history from patient or surrogate, ordering and performing treatments and interventions, ordering and review of laboratory studies, ordering and review of radiographic studies, pulse oximetry, re-evaluation of patient's condition and review of old charts   I assumed direction of critical care for this patient from another provider in my specialty: no       Medications Ordered in ED Medications  dextrose 50 % solution 50 mL (50 mLs Intravenous Given 11/06/22 2059)  calcium gluconate 1 g/ 50 mL sodium chloride IVPB (0 mg Intravenous Stopped 11/06/22 2325)    ED Course/ Medical Decision Making/ A&P                           Medical Decision Making Amount and/or Complexity of Data Reviewed Labs: ordered. Radiology: ordered.  Risk Prescription drug management.   KEOSHA ROSSA is here with weakness.  Found to have blood sugar of 38 in triage.  She takes long-acting glargine insulin.  She thinks she took about 10 units this afternoon before eating dinner because her sugar  was in the 300s.  She was feeling weak and was having hard time walking.  She had a family member come over and they checked her sugar and it was in the 64s.  They did not call EMS and drove here instead.  Patient arrives with a low blood sugar here of 38.  She  was given an amp of D50.  Blood sugars arrived now to 135.  Vital signs are unremarkable.  Mental status has improved.  She is pretty confident she took 10 units of her glargine sometime this afternoon but did not eat.  She states that she takes 10 units with supper and then 16 units at bedtime.  But possibly that maybe she is taking 10 units with some other meals as well.  She is not having any chest pain or shortness of breath.  She was recently discharged after having a lot of fluid taken off she states.  Has a history of heart failure.  She is not having any difficulty breathing.  Overall we will let her eat and drink and continue to trend blood sugars.  I am little bit concerned that this is a long-acting insulin and that quite sure if she has been taking more than she is telling me.  CBC, BMP ordered.  Per my review and interpretation of labs, no significant leukocytosis or anemia.  Repeat blood sugar 118 and 116.  Calcium low at 5.9.  This seems likely to be somewhat around her baseline.  Will give a dose IV calcium.  Suspect that this is from diuretic use.  Patient handed off to oncoming ED staff with her pending some repeat CBGs.  Will see if maybe we can get her home because it is possible that she might of taken her normal dose of insulin but just did not eat.  Please see oncoming ED providers note for further results, evaluation, disposition of the patient.   Final Clinical Impression(s) / ED Diagnoses Final diagnoses:  Hypoglycemia    Rx / DC Orders ED Discharge Orders     None         Lennice Sites, DO 11/06/22 [place], [place], DO 11/06/22 2352

## 2022-11-07 ENCOUNTER — Other Ambulatory Visit (HOSPITAL_COMMUNITY): Payer: Medicare HMO

## 2022-11-07 ENCOUNTER — Telehealth (HOSPITAL_COMMUNITY): Payer: Self-pay | Admitting: *Deleted

## 2022-11-07 ENCOUNTER — Other Ambulatory Visit: Payer: Self-pay

## 2022-11-07 LAB — CBG MONITORING, ED
Glucose-Capillary: 109 mg/dL — ABNORMAL HIGH (ref 70–99)
Glucose-Capillary: 121 mg/dL — ABNORMAL HIGH (ref 70–99)
Glucose-Capillary: 123 mg/dL — ABNORMAL HIGH (ref 70–99)
Glucose-Capillary: 148 mg/dL — ABNORMAL HIGH (ref 70–99)

## 2022-11-07 MED ORDER — OXYCODONE-ACETAMINOPHEN 5-325 MG PO TABS
1.0000 | ORAL_TABLET | Freq: Once | ORAL | Status: AC
  Administered 2022-11-07: 1 via ORAL
  Filled 2022-11-07: qty 1

## 2022-11-07 NOTE — ED Provider Notes (Signed)
Patient got to me for monitoring of blood sugars.  Patient with hypoglycemia in the setting of long-acting insulin injections.  It is not clear exactly how many or when she took the injections.  Patient was therefore monitored through the night and has done well, no recurrent hypoglycemic episodes.  She will be appropriate for discharge, follow-up with her doctor   Orpah Greek, MD 11/07/22 [phone]

## 2022-11-07 NOTE — Telephone Encounter (Signed)
Pt left vm stating she had labs in the ED this morning and asked does she need to come back for lab appt this afternoon. I called pt back no answer.

## 2022-11-10 ENCOUNTER — Inpatient Hospital Stay: Payer: Medicare HMO

## 2022-11-10 ENCOUNTER — Telehealth: Payer: Self-pay

## 2022-11-10 ENCOUNTER — Encounter: Payer: Self-pay | Admitting: Nurse Practitioner

## 2022-11-10 ENCOUNTER — Inpatient Hospital Stay: Payer: Medicare HMO | Attending: Oncology

## 2022-11-10 ENCOUNTER — Inpatient Hospital Stay (HOSPITAL_BASED_OUTPATIENT_CLINIC_OR_DEPARTMENT_OTHER): Payer: Medicare HMO | Admitting: Nurse Practitioner

## 2022-11-10 VITALS — BP 106/90 | HR 64 | Temp 98.2°F | Resp 18 | Wt 148.6 lb

## 2022-11-10 DIAGNOSIS — C9201 Acute myeloblastic leukemia, in remission: Secondary | ICD-10-CM | POA: Diagnosis not present

## 2022-11-10 DIAGNOSIS — Z452 Encounter for adjustment and management of vascular access device: Secondary | ICD-10-CM | POA: Diagnosis not present

## 2022-11-10 DIAGNOSIS — Z170 Estrogen receptor positive status [ER+]: Secondary | ICD-10-CM

## 2022-11-10 DIAGNOSIS — C50912 Malignant neoplasm of unspecified site of left female breast: Secondary | ICD-10-CM | POA: Diagnosis not present

## 2022-11-10 DIAGNOSIS — Z95828 Presence of other vascular implants and grafts: Secondary | ICD-10-CM

## 2022-11-10 DIAGNOSIS — C50412 Malignant neoplasm of upper-outer quadrant of left female breast: Secondary | ICD-10-CM | POA: Diagnosis not present

## 2022-11-10 DIAGNOSIS — Z79811 Long term (current) use of aromatase inhibitors: Secondary | ICD-10-CM | POA: Diagnosis not present

## 2022-11-10 LAB — CBC WITH DIFFERENTIAL (CANCER CENTER ONLY)
Abs Immature Granulocytes: 0.02 10*3/uL (ref 0.00–0.07)
Basophils Absolute: 0 10*3/uL (ref 0.0–0.1)
Basophils Relative: 0 %
Eosinophils Absolute: 0.3 10*3/uL (ref 0.0–0.5)
Eosinophils Relative: 4 %
HCT: 28.3 % — ABNORMAL LOW (ref 36.0–46.0)
Hemoglobin: 9.1 g/dL — ABNORMAL LOW (ref 12.0–15.0)
Immature Granulocytes: 0 %
Lymphocytes Relative: 16 %
Lymphs Abs: 1 10*3/uL (ref 0.7–4.0)
MCH: 30.5 pg (ref 26.0–34.0)
MCHC: 32.2 g/dL (ref 30.0–36.0)
MCV: 95 fL (ref 80.0–100.0)
Monocytes Absolute: 0.5 10*3/uL (ref 0.1–1.0)
Monocytes Relative: 8 %
Neutro Abs: 4.5 10*3/uL (ref 1.7–7.7)
Neutrophils Relative %: 72 %
Platelet Count: 181 10*3/uL (ref 150–400)
RBC: 2.98 MIL/uL — ABNORMAL LOW (ref 3.87–5.11)
RDW: 14.1 % (ref 11.5–15.5)
WBC Count: 6.3 10*3/uL (ref 4.0–10.5)
nRBC: 0 % (ref 0.0–0.2)

## 2022-11-10 MED ORDER — HEPARIN SOD (PORK) LOCK FLUSH 100 UNIT/ML IV SOLN
500.0000 [IU] | Freq: Once | INTRAVENOUS | Status: AC | PRN
  Administered 2022-11-10: 500 [IU] via INTRAVENOUS

## 2022-11-10 MED ORDER — SODIUM CHLORIDE 0.9% FLUSH
10.0000 mL | Freq: Once | INTRAVENOUS | Status: AC
  Administered 2022-11-10: 10 mL via INTRAVENOUS

## 2022-11-10 NOTE — Progress Notes (Signed)
[facility] OFFICE PROGRESS NOTE   Diagnosis: AML, breast cancer, pancreas lesion  INTERVAL HISTORY:   [name] returns for follow-up.  She was hospitalized in September with a right hip fracture following a fall.  She was hospitalized in November with heart failure.  She was seen in the emergency department a few days ago with low blood sugar.  She continues letrozole.  No hot flashes.  She has chronic joint pains.  No bleeding.  No fever.  She feels that her blood sugar is low at present.  She thinks it was 54 when she left the house.  Objective:  Vital signs in last 24 hours:  Blood pressure (!) 106/90, pulse 64, temperature 98.2 F (36.8 C), temperature source Temporal, resp. rate 18, weight 148 lb 9.6 oz (67.4 kg), SpO2 98 %.    HEENT: No thrush or ulcers. Lymphatics: No palpable cervical, supraclavicular or axillary lymph nodes. Resp: Lungs clear bilaterally. Cardio: Regular rate and rhythm. GI: No hepatosplenomegaly. Vascular: Trace pitting edema lower leg bilaterally. Breast: Status post left lumpectomy.  No evidence for local tumor recurrence.  No mass palpated in either breast. Skin: Large ecchymosis right lower arm.   Lab Results:  Lab Results  Component Value Date   WBC 6.3 11/10/2022   HGB 9.1 (L) 11/10/2022   HCT 28.3 (L) 11/10/2022   MCV 95.0 11/10/2022   PLT 181 11/10/2022   NEUTROABS 4.5 11/10/2022    Imaging:  No results found.  Medications: I have reviewed the patient's current medications.  Assessment/Plan: Acute myelogenous leukemia, monocytic differentiation diagnosed in March of 2015   Induction cytarabine/daunorubicin (7+3) followed by reinduction with cytarabine/daunorubicin and zinecard (5+2) with a recovering bone marrow 05/16/2014 consistent with remission   Status post cycle 1 high-dose araC consolidation 05/30/2014   Status post cycle 2 high-dose araC consolidation 07/11/2014. Status post cycle 3 high-dose araC  consolidation 08/22/2014. Bone marrow 09/15/2014 showed necrotic marrow with minute focus of myeloid precursors. No evidence of viable acute leukemia. 2.  History of pancytopenia secondary to chemotherapy.   3. C. difficile colitis-06/09/2014 treated with Flagyl   4. Diabetes   5. renal insufficiency   6. history of hypokalemia and hypomagnesemia. 7. history of atrial fibrillation   9. history of coronary artery disease   10. Hospitalization with febrile neutropenia/sepsis secondary to Escherichia coli bacteremia 09/02/2014. 11. Shingles involving the right back October 2015. 12. Meningioma on brain CT 05/24/2014 and brain MRI 05/24/2014; brain CT 12/05/2021-stable calcified meningioma along the anterior falx; brain MRI 12/06/2021 with meningioma measuring 17 mm.  Seen by neurosurgery, no acute neurosurgical intervention indicated. 52. Hospitalization 08/24/2017 through 08/28/2017 with acute CHF. 14.  Admission 11/21/2017 with expressive aphasia, seizure activity noted on EEG, placed on Keppra 15.  Left breast cancer, T1NX, status post a left lumpectomy 10/24/2019-1.5 cm, grade 2, associated with high-grade DCIS with necrosis, ER 95%, PR 95%, HER-2 negative, KI-6710% Letrozole 11/02/2019 16.  ER 05/08/2020-dyspnea CT chest 05/08/2020-negative for pulmonary embolism, new patchy sclerosis throughout the thoracic skeleton concerning for bone metastases MRI thoracic spine 06/08/2020-consistent with successfully treated marrow replacement process in 2015, current area of concern at T9 could represent a benign vascular lesion 17.  Admission 12/05/2021 through 12/09/2021 with seizures, hyperglycemia 18.  CT abdomen/pelvis 12/07/2021-cystic pancreas head lesion, cirrhotic liver, lytic and sclerotic bone lesions.  Bone lesions felt to  19.  MRI abdomen 12/09/2021-large cystic lesion within the head of the pancreas, cirrhosis, no suspicious bone lesions on upper EUS 01/30/2022-well-circumscribed complex  cystic  lesion in the head of the pancreas, measures 4.6 cm, appears to contain gelatinous consistency material and clearly communicates with a dilated main pancreatic duct.  Cytology, no malignant cells identified.  Treated with Cipro.  Per GI note repeat CT 3 to 4 months.6 CT abdomen/pelvis 04/17/2022-unchanged complex lesion in the pancreas head, cirrhosis with indeterminate right liver lesion (no abnormality seen on MRI), lytic and sclerotic bone lesions, few right pulmonary nodules-unchanged compared to 2015 20.  Admission with congestive heart failure 04/16/2022, November 2023 21.  Admission in September with right hip fracture following a fall    Disposition: [name] remains in clinical remission from breast cancer.  She will continue letrozole.  She remains in remission from AML.  She was concerned blood sugar was low in the office.  Fingerstick glucose returned at 239.  She will return for a port flush in 8 weeks.  Follow-up appointment in 16 weeks.    Ned Card ANP/GNP-BC   11/10/2022  3:16 PM

## 2022-11-10 NOTE — Telephone Encounter (Signed)
Glucose is 239

## 2022-11-12 ENCOUNTER — Inpatient Hospital Stay: Payer: Medicare HMO | Admitting: Internal Medicine

## 2022-11-12 ENCOUNTER — Telehealth: Payer: Self-pay | Admitting: Cardiovascular Disease

## 2022-11-12 NOTE — Telephone Encounter (Signed)
Spoke with [name],PT with [name] who was providing an FYI regarding pt's fluid status.  Attempted phone call to pt and reached pt's daughter [name], [place] who states she is not with the pt.  Advised of PT's concerns regarding volume overload.  Reviewed hospital discharge recommendations per hospitalist to increase Torsemide '40mg'$  to twice daily for weight gain weight gain 2 to 3 lbs in 24 hrs or 5 lbs in 7 days.  Pt missed her appointment with her PCP today.  Encouraged pt's daughter to reschedule asap.  Pt's daughter also states she was unaware of TEE procedure that was scheduled for 11/19/2022.  Encouraged daughter to review hospital discharge papers for scheduled follow up appointments.  Reviewed ED precautions.  Pt's daughter verbalizes understanding and agrees with current plan.  Physician Discharge Summary    Patient: [name] MRN: [id number] DOB: 1941/06/30  Admit date:     10/16/2022  Discharge date: 10/28/22  Discharge Physician: Jimmy Picket Arrien    PCP: Isaac Bliss, Rayford Halsted, MD    Recommendations at discharge:     Patient will continue torsemide 40 mg daily, increase to bid in case of volume overload, weight gain 2 to 3 lbs in 24 hrs or 5 lbs in 7 days.

## 2022-11-12 NOTE — Telephone Encounter (Signed)
Pt c/o swelling: STAT is pt has developed SOB within 24 hours  If swelling, where is the swelling located? Right Leg - Pitting Edema  How much weight have you gained and in what time span? 7 pounds  Have you gained 3 pounds in a day or 5 pounds in a week? Yes  Do you have a log of your daily weights (if so, list)? No  Are you currently taking a fluid pill? Yes  Are you currently SOB? No  Have you traveled recently? No

## 2022-11-18 ENCOUNTER — Ambulatory Visit: Payer: Medicare HMO | Admitting: Cardiovascular Disease

## 2022-11-18 ENCOUNTER — Encounter (HOSPITAL_COMMUNITY): Payer: Self-pay

## 2022-11-18 ENCOUNTER — Telehealth (HOSPITAL_COMMUNITY): Payer: Self-pay

## 2022-11-18 ENCOUNTER — Other Ambulatory Visit (HOSPITAL_COMMUNITY): Payer: Self-pay

## 2022-11-18 DIAGNOSIS — I359 Nonrheumatic aortic valve disorder, unspecified: Secondary | ICD-10-CM

## 2022-11-18 DIAGNOSIS — I5032 Chronic diastolic (congestive) heart failure: Secondary | ICD-10-CM

## 2022-11-18 NOTE — Telephone Encounter (Signed)
Pre procedure call to remind patient about procedure for tomorrow. Aware of nothing to eat or drink after midnight. Holding insulin night before. Has transportation

## 2022-11-19 ENCOUNTER — Encounter (HOSPITAL_COMMUNITY): Payer: Self-pay | Admitting: Cardiology

## 2022-11-19 ENCOUNTER — Ambulatory Visit (HOSPITAL_BASED_OUTPATIENT_CLINIC_OR_DEPARTMENT_OTHER): Payer: Medicare HMO | Admitting: Anesthesiology

## 2022-11-19 ENCOUNTER — Ambulatory Visit (HOSPITAL_COMMUNITY): Payer: Medicare HMO | Admitting: Anesthesiology

## 2022-11-19 ENCOUNTER — Ambulatory Visit (HOSPITAL_BASED_OUTPATIENT_CLINIC_OR_DEPARTMENT_OTHER)
Admission: RE | Admit: 2022-11-19 | Discharge: 2022-11-19 | Disposition: A | Discharge status: Home / Self Care | Payer: Medicare HMO | Source: Ambulatory Visit | Attending: Cardiology | Admitting: Cardiology

## 2022-11-19 ENCOUNTER — Encounter (HOSPITAL_COMMUNITY)
Admission: RE | Disposition: A | Discharge status: Home / Self Care | Payer: Self-pay | Source: Home / Self Care | Attending: Cardiology

## 2022-11-19 ENCOUNTER — Ambulatory Visit (HOSPITAL_COMMUNITY)
Admission: RE | Admit: 2022-11-19 | Discharge: 2022-11-19 | Disposition: A | Discharge status: Home / Self Care | Payer: Medicare HMO | Attending: Cardiology | Admitting: Cardiology

## 2022-11-19 ENCOUNTER — Other Ambulatory Visit: Payer: Self-pay

## 2022-11-19 DIAGNOSIS — J90 Pleural effusion, not elsewhere classified: Secondary | ICD-10-CM | POA: Insufficient documentation

## 2022-11-19 DIAGNOSIS — Z87891 Personal history of nicotine dependence: Secondary | ICD-10-CM | POA: Diagnosis not present

## 2022-11-19 DIAGNOSIS — I4891 Unspecified atrial fibrillation: Secondary | ICD-10-CM | POA: Insufficient documentation

## 2022-11-19 DIAGNOSIS — I5032 Chronic diastolic (congestive) heart failure: Secondary | ICD-10-CM | POA: Insufficient documentation

## 2022-11-19 DIAGNOSIS — Z79899 Other long term (current) drug therapy: Secondary | ICD-10-CM | POA: Insufficient documentation

## 2022-11-19 DIAGNOSIS — E1122 Type 2 diabetes mellitus with diabetic chronic kidney disease: Secondary | ICD-10-CM | POA: Diagnosis not present

## 2022-11-19 DIAGNOSIS — Z794 Long term (current) use of insulin: Secondary | ICD-10-CM | POA: Insufficient documentation

## 2022-11-19 DIAGNOSIS — I252 Old myocardial infarction: Secondary | ICD-10-CM | POA: Diagnosis not present

## 2022-11-19 DIAGNOSIS — Z853 Personal history of malignant neoplasm of breast: Secondary | ICD-10-CM | POA: Insufficient documentation

## 2022-11-19 DIAGNOSIS — I2510 Atherosclerotic heart disease of native coronary artery without angina pectoris: Secondary | ICD-10-CM

## 2022-11-19 DIAGNOSIS — I083 Combined rheumatic disorders of mitral, aortic and tricuspid valves: Secondary | ICD-10-CM | POA: Insufficient documentation

## 2022-11-19 DIAGNOSIS — N1832 Chronic kidney disease, stage 3b: Secondary | ICD-10-CM | POA: Insufficient documentation

## 2022-11-19 DIAGNOSIS — E785 Hyperlipidemia, unspecified: Secondary | ICD-10-CM | POA: Diagnosis not present

## 2022-11-19 DIAGNOSIS — I359 Nonrheumatic aortic valve disorder, unspecified: Secondary | ICD-10-CM | POA: Diagnosis not present

## 2022-11-19 DIAGNOSIS — Z7901 Long term (current) use of anticoagulants: Secondary | ICD-10-CM | POA: Diagnosis not present

## 2022-11-19 DIAGNOSIS — J449 Chronic obstructive pulmonary disease, unspecified: Secondary | ICD-10-CM | POA: Insufficient documentation

## 2022-11-19 DIAGNOSIS — K7460 Unspecified cirrhosis of liver: Secondary | ICD-10-CM | POA: Diagnosis not present

## 2022-11-19 DIAGNOSIS — I509 Heart failure, unspecified: Secondary | ICD-10-CM

## 2022-11-19 DIAGNOSIS — I130 Hypertensive heart and chronic kidney disease with heart failure and stage 1 through stage 4 chronic kidney disease, or unspecified chronic kidney disease: Secondary | ICD-10-CM | POA: Diagnosis not present

## 2022-11-19 DIAGNOSIS — C9201 Acute myeloblastic leukemia, in remission: Secondary | ICD-10-CM | POA: Insufficient documentation

## 2022-11-19 DIAGNOSIS — I358 Other nonrheumatic aortic valve disorders: Secondary | ICD-10-CM

## 2022-11-19 DIAGNOSIS — Z955 Presence of coronary angioplasty implant and graft: Secondary | ICD-10-CM | POA: Insufficient documentation

## 2022-11-19 DIAGNOSIS — I110 Hypertensive heart disease with heart failure: Secondary | ICD-10-CM | POA: Diagnosis not present

## 2022-11-19 DIAGNOSIS — Z7902 Long term (current) use of antithrombotics/antiplatelets: Secondary | ICD-10-CM | POA: Diagnosis not present

## 2022-11-19 DIAGNOSIS — I700 Atherosclerosis of aorta: Secondary | ICD-10-CM | POA: Diagnosis not present

## 2022-11-19 HISTORY — PX: TEE WITHOUT CARDIOVERSION: SHX5443

## 2022-11-19 LAB — GLUCOSE, CAPILLARY: Glucose-Capillary: 131 mg/dL — ABNORMAL HIGH (ref 70–99)

## 2022-11-19 SURGERY — ECHOCARDIOGRAM, TRANSESOPHAGEAL
Anesthesia: Monitor Anesthesia Care

## 2022-11-19 MED ORDER — PROPOFOL 10 MG/ML IV BOLUS
INTRAVENOUS | Status: DC | PRN
  Administered 2022-11-19 (×2): 20 mg via INTRAVENOUS
  Administered 2022-11-19: 10 mg via INTRAVENOUS

## 2022-11-19 MED ORDER — LIDOCAINE HCL (CARDIAC) PF 100 MG/5ML IV SOSY
PREFILLED_SYRINGE | INTRAVENOUS | Status: DC | PRN
  Administered 2022-11-19: 50 mg via INTRATRACHEAL

## 2022-11-19 MED ORDER — SODIUM CHLORIDE 0.9 % IV SOLN: INTRAVENOUS | Status: DC

## 2022-11-19 MED ORDER — PROPOFOL 500 MG/50ML IV EMUL
INTRAVENOUS | Status: DC | PRN
  Administered 2022-11-19: 100 ug/kg/min via INTRAVENOUS

## 2022-11-19 MED ORDER — PHENYLEPHRINE 80 MCG/ML (10ML) SYRINGE FOR IV PUSH (FOR BLOOD PRESSURE SUPPORT)
PREFILLED_SYRINGE | INTRAVENOUS | Status: DC | PRN
  Administered 2022-11-19: 80 ug via INTRAVENOUS
  Administered 2022-11-19: 40 ug via INTRAVENOUS

## 2022-11-19 MED ORDER — BUTAMBEN-TETRACAINE-BENZOCAINE 2-2-14 % EX AERO
INHALATION_SPRAY | CUTANEOUS | Status: DC | PRN
  Administered 2022-11-19: 2 via TOPICAL

## 2022-11-19 NOTE — Anesthesia Preprocedure Evaluation (Addendum)
Anesthesia Evaluation  Patient identified by MRN, date of birth, ID band Patient awake    Reviewed: Allergy & Precautions, NPO status , Patient's Chart, lab work & pertinent test results  History of Anesthesia Complications Negative for: history of anesthetic complications  Airway Mallampati: II  TM Distance: >3 FB Neck ROM: Full    Dental  (+) Edentulous Lower, Edentulous Upper   Pulmonary COPD, former smoker   Pulmonary exam normal        Cardiovascular hypertension, + CAD, + Past MI and +CHF  Normal cardiovascular exam+ dysrhythmias Atrial Fibrillation (-) pacemaker  TTE 10/16/22: EF 60-65%, mild LVH, mild pHTN, moderate LAE, mild MR, moderate to severe TR, mild to moderate AS    Neuro/Psych Seizures -,  TIA   GI/Hepatic negative GI ROS, Neg liver ROS,,,  Endo/Other  diabetes, Type 2, Insulin Dependent    Renal/GU Renal InsufficiencyRenal disease  negative genitourinary   Musculoskeletal  (+) Arthritis ,    Abdominal   Peds  Hematology  (+) Blood dyscrasia (Hgb 9.1), anemia   Anesthesia Other Findings Day of surgery medications reviewed with patient.  Reproductive/Obstetrics negative OB ROS                             Anesthesia Physical Anesthesia Plan  ASA: 4  Anesthesia Plan: MAC   Post-op Pain Management: Minimal or no pain anticipated   Induction:   PONV Risk Score and Plan: 2 and Treatment may vary due to age or medical condition and Propofol infusion  Airway Management Planned: Natural Airway and Nasal Cannula  Additional Equipment: None  Intra-op Plan:   Post-operative Plan:   Informed Consent: I have reviewed the patients History and Physical, chart, labs and discussed the procedure including the risks, benefits and alternatives for the proposed anesthesia with the patient or authorized representative who has indicated his/her understanding and acceptance.        Plan Discussed with: CRNA  Anesthesia Plan Comments:         Anesthesia Quick Evaluation

## 2022-11-19 NOTE — Anesthesia Postprocedure Evaluation (Signed)
Anesthesia Post Note  Patient: [name]  Procedure(s) Performed: TRANSESOPHAGEAL ECHOCARDIOGRAM (TEE)     Patient location during evaluation: PACU Anesthesia Type: MAC Level of consciousness: awake and alert Pain management: pain level controlled Vital Signs Assessment: post-procedure vital signs reviewed and stable Respiratory status: spontaneous breathing, nonlabored ventilation and respiratory function stable Cardiovascular status: blood pressure returned to baseline Postop Assessment: no apparent nausea or vomiting Anesthetic complications: no   No notable events documented.  Last Vitals:  Vitals:   11/19/22 1210 11/19/22 1220  BP: 121/80 (!) 140/74  Pulse: 69 74  Resp: 15 16  Temp:    SpO2: 96% 95%    Last Pain:  Vitals:   11/19/22 1220  TempSrc:   PainSc: 0-No pain                 Marthenia Rolling

## 2022-11-19 NOTE — Anesthesia Procedure Notes (Signed)
Procedure Name: MAC Date/Time: 11/19/2022 11:35 AM  Performed by: Mariea Clonts, CRNAPre-anesthesia Checklist: Patient identified, Emergency Drugs available, Suction available, Patient being monitored and Timeout performed Patient Re-evaluated:Patient Re-evaluated prior to induction Oxygen Delivery Method: Nasal cannula and Simple face mask

## 2022-11-19 NOTE — Transfer of Care (Signed)
Immediate Anesthesia Transfer of Care Note  Patient: [name]  Procedure(s) Performed: TRANSESOPHAGEAL ECHOCARDIOGRAM (TEE)  Patient Location: Endoscopy Unit  Anesthesia Type:MAC  Level of Consciousness: awake, alert , and oriented  Airway & Oxygen Therapy: Patient Spontanous Breathing and Patient connected to nasal cannula oxygen  Post-op Assessment: Report given to RN and Post -op Vital signs reviewed and stable  Post vital signs: Reviewed and stable  Last Vitals:  Vitals Value Taken Time  BP 122/81 11/19/22 1200  Temp    Pulse 72 11/19/22 1200  Resp 18 11/19/22 1200  SpO2 97 % 11/19/22 1200  Vitals shown include unvalidated device data.  Last Pain:  Vitals:   11/19/22 1159  TempSrc:   PainSc: 0-No pain         Complications: No notable events documented.

## 2022-11-19 NOTE — Discharge Instructions (Addendum)

## 2022-11-21 NOTE — Interval H&P Note (Signed)
History and Physical Interval Note:  11/21/2022 3:51 PM  [name]  has presented today for surgery, with the diagnosis of AORTIC VALVE DISEASE.  The various methods of treatment have been discussed with the patient and family. After consideration of risks, benefits and other options for treatment, the patient has consented to  Procedure(s): TRANSESOPHAGEAL ECHOCARDIOGRAM (TEE) (N/A) as a surgical intervention.  The patient's history has been reviewed, patient examined, no change in status, stable for surgery.  I have reviewed the patient's chart and labs.  Questions were answered to the patient's satisfaction.     Kainoa Swoboda

## 2022-11-23 ENCOUNTER — Encounter (HOSPITAL_COMMUNITY): Payer: Self-pay | Admitting: Cardiology

## 2022-11-25 DIAGNOSIS — R000 Tachycardia, unspecified: Secondary | ICD-10-CM | POA: Diagnosis not present

## 2022-11-25 DIAGNOSIS — E161 Other hypoglycemia: Secondary | ICD-10-CM | POA: Diagnosis not present

## 2022-11-25 DIAGNOSIS — E162 Hypoglycemia, unspecified: Secondary | ICD-10-CM | POA: Diagnosis not present

## 2022-11-25 DIAGNOSIS — R55 Syncope and collapse: Secondary | ICD-10-CM | POA: Diagnosis not present

## 2022-11-28 ENCOUNTER — Ambulatory Visit: Payer: Medicare HMO | Attending: Cardiovascular Disease | Admitting: Nurse Practitioner

## 2022-11-28 ENCOUNTER — Telehealth: Payer: Self-pay | Admitting: *Deleted

## 2022-11-28 NOTE — Progress Notes (Deleted)
Cardiology Office Note:    Date:  11/28/2022   ID:  [name], DOB 01-Apr-1941, MRN [id number]  PCP:  [name], Catherine Halsted, MD   [facility] Providers Cardiologist:  Catherine Moores, MD     Referring MD: [name], Estel*   Chief Complaint: follow-up leg edema  History of Present Illness:    [name] is a pleasant 81 y.o. 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   She was admitted in 2018 with non-ST segment elevation MI in the setting of acute combined systolic and diastolic congestive heart failure.  Ejection fraction 40 to 45%.  History of CKD, treated medically in order to avoid cardiac catheterization at that time.  Underwent cardiac catheterization 09/17/2017 which revealed significant multivessel coronary artery disease, including sequential 40% ostial and 50% distal LMCA stenosis, sequential proximal and mid LAD stenosis of 50 to 60%, 40% distal LCx lesion and 80% proximal to mid RCA stenosis.  FFR of LAD hemodynamically significant (0.72).  Surgical consultation with CT surgery felt to be too high risk for CABG.  She subsequently had PCI of RCA with finding of moderate LAD stenosis to be treated medically with possible future intervention if symptomatic.  Continued medical management, found to be quite anemic 11/2020 with history of AML.  Leg swelling had improved on torsemide. Has chronic dyspnea.  Found to be in atrial fibrillation June 2023 and started on Eliquis 2.5 mg twice daily. Also started on Entresto during admission.   Seen on 05/19/2022 by [name] at which time she continued to have significant leg edema.  Admitted to spending much of  the day with her legs down.  Was advised to elevate legs and increase torsemide 40 mg twice daily for 3 days. Having itching felt to be 2/2 to [facility] per Pharm D, switched to valsartan 80 mg daily. Was advised to return in 3 months for follow-up.  Seen by me on 08/21/22. Did not get one of her medications because it is $500. Has completely run out but does not know which medication. Felt likely to be Eliquis or Jardiance.  I emphasized the importance of anticoagulation in the setting of atrial fibrillation due to risk of stroke.  Her CHA2DS2-VASc = 6. We discussed alternatives to Eliquis. She continues to work making eye glasses. Denied chest pain, shortness of breath, lower extremity edema, fatigue, palpitations, melena, hematuria, hemoptysis, diaphoresis, weakness, presyncope, syncope, orthopnea, and PND. Leg edema has improved with leg elevation using lounge doctor and she takes additional torsemide on occasion which helps. Is a poor historian and does not seem to grasp the gravity of her multiple chronic diseases. Says she will not qualify for patient assistance for cost of medications due to continues to work and [name].   Admission 9/22-9/29/23 s/p fall and fracture of right hip and surgical repair on 9/23.  Hospitalization complicated by acute urinary retention.  Creatinine peaked at 3.85, renal ultrasound negative for hydronephrosis.  SCr back to baseline of 1.5-1.9 at discharge. Cardiac medications were resumed at d/c including Eliquis. She proceeded to inpatient rehab and was d/ced on 10/01/22.  Admission 11/9-11/21/23. Seen in consult by cardiology on 11/9 for evaluation of heart failure. Admitted with acute on  chronic diastolic CHF, anasarca. Reported that she battled lower extremity swelling continued after discharge despite taking torsemide.  She had increased her torsemide to 40 mg twice daily for 4 days without improvement.  She did not have an increase in urine output during  this time as expected.  She felt short of breath and had increased swelling the day of admission.  CXR revealed right pleural effusion. BNP 1200.  Underwent thoracentesis for R pleural effusion.  Course C/B mild AKI.  SGLT 2i and ARB held at discharged.  Discharged on 40 mg torsemide daily, weight 139 lb.  Echo 11/9 with EF 60 to 65%, RV okay, moderate LAE, mild MR, severe MAC, moderate to severe TR, severely calcified aortic valve with mild to moderate AAS (mean gradient 12 mmHg, AVA by VTI 1.01 cm.    Seen by Catherine Rinks, NP, AHF clinic on 11/04/22.  Weight stable.  Using a walker since hip fracture, strength improving but unable to walk long distances.  Intermittent lightheadedness with position changes. Labs 11/28 revealed creatinine 3.56, bnp 560.  She was advised to hold torsemide and have repeat lab work on 12/1.  Past Medical History:  Diagnosis Date   Acute myelogenous leukemia ([place]) 02/2014   Acute pancreatitis    Arthritis    "back" (09/17/2017)   Atrial flutter (HCC)    Cardiomyopathy (HCC)    CHF (congestive heart failure) (HCC)    Chronic kidney disease, stage II (mild)    Chronic lower back pain    Colon polyps 04/29/2010   TUBULAR ADENOMA AND A SERRATED ADENOMA   COPD (chronic obstructive pulmonary disease) ([place])    CORONARY ARTERY DISEASE 12/24/2007   DIABETES MELLITUS, TYPE II 07/13/2007   History of blood transfusion 2015   "related to leukemia"   History of kidney stones 12/24/2007   History of uterine cancer    HYPERLIPIDEMIA 12/24/2007   HYPERTENSION 07/13/2007   NSTEMI (non-ST elevated myocardial infarction) ([place]) 09/17/2017   Uterine cancer ([place])     Past Surgical History:  Procedure Laterality Date   ABDOMINAL HYSTERECTOMY     "still have my ovaries"   BREAST BIOPSY Left 09/22/2019   BREAST LUMPECTOMY Left 10/24/2019   BREAST LUMPECTOMY WITH RADIOACTIVE SEED LOCALIZATION Left 10/24/2019   Procedure: LEFT BREAST LUMPECTOMY WITH RADIOACTIVE SEED  LOCALIZATION;  Surgeon: Catherine Kussmaul, MD;  Location: [place] [facility];  Service: General;  Laterality: Left;   CARDIAC CATHETERIZATION  2002   CARDIAC CATHETERIZATION  09/17/2017   CATARACT EXTRACTION W/ INTRAOCULAR LENS  IMPLANT, BILATERAL Bilateral    COLONOSCOPY W/ BIOPSIES AND POLYPECTOMY  2011   CORONARY STENT INTERVENTION N/A 09/21/2017   Procedure: CORONARY STENT INTERVENTION;  Surgeon: Troy Sine, MD;  Location: [place] [facility];  Service: Cardiovascular;  Laterality: N/A;   DILATION AND CURETTAGE OF UTERUS     ESOPHAGOGASTRODUODENOSCOPY N/A 01/30/2022   Procedure: ESOPHAGOGASTRODUODENOSCOPY (EGD);  Surgeon: Milus Banister, MD;  Location: [facility];  Service: Endoscopy;  Laterality: N/A;   EUS N/A 01/30/2022   Procedure: UPPER ENDOSCOPIC ULTRASOUND (EUS) RADIAL;  Surgeon: Milus Banister, MD;  Location: [facility];  Service: Endoscopy;  Laterality: N/A;   EXCISIONAL HEMORRHOIDECTOMY  X 2   FINE NEEDLE ASPIRATION N/A 01/30/2022   Procedure: FINE NEEDLE ASPIRATION (FNA) LINEAR;  Surgeon: Milus Banister, MD;  Location: [facility];  Service: Endoscopy;  Laterality: N/A;   GANGLION CYST EXCISION Left X 3   INTRAMEDULLARY (IM) NAIL INTERTROCHANTERIC Right 08/30/2022   Procedure:  INTRAMEDULLARY (IM) NAIL INTERTROCHANTERIC;  Surgeon: Altamese Roy, MD;  Location: [place];  Service: Orthopedics;  Laterality: Right;   INTRAVASCULAR PRESSURE WIRE/FFR STUDY N/A 09/17/2017   Procedure: INTRAVASCULAR PRESSURE WIRE/FFR STUDY;  Surgeon: Nelva Bush, MD;  Location: [place] [facility];  Service: Cardiovascular;  Laterality: N/A;   IR THORACENTESIS ASP PLEURAL SPACE W/IMG GUIDE  10/27/2022   NASAL SINUS SURGERY     PORTA CATH INSERTION Right 2013   RIGHT/LEFT HEART CATH AND CORONARY ANGIOGRAPHY N/A 09/17/2017   Procedure: RIGHT/LEFT HEART CATH AND CORONARY ANGIOGRAPHY;  Surgeon: Nelva Bush, MD;  Location: [facility];  Service: Cardiovascular;  Laterality: N/A;    TEE WITHOUT CARDIOVERSION N/A 11/19/2022   Procedure: TRANSESOPHAGEAL ECHOCARDIOGRAM (TEE);  Surgeon: Hebert Soho, DO;  Location: [facility];  Service: Cardiovascular;  Laterality: N/A;   TUBAL LIGATION      Current Medications: No outpatient medications have been marked as taking for the 11/28/22 encounter (Appointment) with Ann Maki, Lanice Schwab, NP.     Allergies:   Patient has no known allergies.   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  Tobacco Use   Smoking status: Former    Packs/day: 1.00    Years: 20.00    Total pack years: 20.00    Types: Cigarettes    Quit date: 12/08/2000    Years since quitting: 21.9   Smokeless tobacco: Never  Vaping Use   Vaping Use: Never used  Substance and Sexual Activity   Alcohol use: No    Alcohol/week: 0.0 standard drinks of alcohol   Drug use: No   Sexual activity: Never  Other Topics Concern   Not on file  Social History Narrative   Still works full-time assembling eye glasses _0    Social Determinants of Health   Financial Resource Strain: [place]  (10/22/2022)   Overall Financial Resource Strain (CARDIA)    Difficulty of Paying Living Expenses: Not very hard  Food Insecurity: No Food Insecurity (11/04/2022)   Hunger Vital Sign    Worried About Running Out of Food in the Last Year: Never true    [place] in the Last Year: Never true  Transportation Needs: No Transportation Needs (11/04/2022)   PRAPARE - Hydrologist (Medical): No    Lack of Transportation (Non-Medical): No  Physical Activity: Not on file  Stress: No Stress Concern Present (05/12/2022)   [place]    Feeling  of Stress : Not at all  Social Connections: Not on file     Family History: The patient's family history includes Breast cancer (age of onset: 34) in her sister; COPD in her father; Cancer in her maternal uncle, niece, paternal aunt, and paternal uncle; Cervical cancer in her sister; Cirrhosis in her maternal uncle; Congestive Heart Failure in her mother; Esophageal cancer in her brother and father; Heart attack in her maternal grandfather, maternal grandmother, and paternal grandfather; Leukemia in her cousin; Lung cancer in her sister, sister, and sister; Skin cancer in her daughter and maternal grandfather; Stroke (age of onset: 3) in her paternal grandmother; Suicidality in her brother. There is no history of Colon  cancer.  ROS:   Please see the history of present illness.   All other systems reviewed and are negative.  Labs/Other Studies Reviewed:    The following studies were reviewed today:  Echo 10/16/22 1. Left ventricular ejection fraction, by estimation, is 60 to 65%. The  left ventricle has normal function. The left ventricle has no regional  wall motion abnormalities. There is mild left ventricular hypertrophy.  Left ventricular diastolic parameters  are indeterminate.   2. Right ventricular systolic function is normal. The right ventricular  size is normal. There is mildly elevated pulmonary artery systolic  pressure.   3. Left atrial size was moderately dilated.   4. The mitral valve is degenerative. Mild mitral valve regurgitation. No  evidence of mitral stenosis. Severe mitral annular calcification.   5. Tricuspid valve regurgitation is moderate to severe.   6. The aortic valve is normal in structure. There is severe calcifcation  of the aortic valve. There is severe thickening of the aortic valve.  Aortic valve regurgitation is not visualized. Mild to moderate aortic  valve stenosis.   7. The inferior vena cava is normal in size with greater than 50%  respiratory  variability, suggesting right atrial pressure of 3 mmHg.    Echo 04/17/22  1. Left ventricular ejection fraction, by estimation, is 60 to 65%. The  left ventricle has normal function. The left ventricle has no regional  wall motion abnormalities. There is mild left ventricular hypertrophy.  Left ventricular diastolic function  could not be evaluated.   2. Right ventricular systolic function is normal. The right ventricular  size is normal. There is normal pulmonary artery systolic pressure. The  estimated right ventricular systolic pressure is 74.1 mmHg.   3. A small pericardial effusion is present. The pericardial effusion is  circumferential. There is no evidence of cardiac tamponade.   4. The mitral valve is abnormal. Trivial mitral valve regurgitation.   5. The aortic valve was not well visualized. There is mild calcification  of the aortic valve. Aortic valve regurgitation is not visualized. Mild to  moderate aortic valve stenosis. Aortic valve area, by VTI measures 1.28  cm. Aortic valve mean gradient  measures 17.0 mmHg. Aortic valve Vmax measures 2.65 m/s. DI is 0.41.   6. The inferior vena cava is normal in size with <50% respiratory  variability, suggesting right atrial pressure of 8 mmHg.   Comparison(s): Changes from prior study are noted. 05/08/2020: LVEF 55-60%,  small pericardial effusion.   Coronary Stent Intervention 09/21/17  A STENT SIERRA 2.50 X 23 MM drug eluting stent was successfully placed. Prox RCA to Mid RCA lesion, 90 %stenosed. Post intervention, there is a 0% residual stenosis.   Difficult but successful PCI to the mid RCA with ultimate insertion of a 2.523 mm Xience Sierra DES stent postdilated to 2.8 mm with the 90% stenosis being reduced to 0%.   RECOMMENDATION: The patient should continue with DAPT, probably indefinitely with her concomitant CAD since she was turned down for CABG revascularization.  Plan to hydrate the patient.  Increased medical  therapy.  Consider staged PCI to the LAD since FFR was positive later this week or possibly in several weeks to allow for stabilization of renal function in this patient with renal insufficiency who already has undergone cath on 10/12 and PCI today vs medical therapy unless increase in symptoms.   R/LHC 09/17/17  Conclusions: Significant multivessel coronary artery disease, including sequential 40% ostial and 50% distal LMCA stenoses, sequential proximal  and mid LAD stenoses of 50-60%, 40% distal LCx lesion, and 80% proximal to mid RCA stenosis. FFR of the LAD is hemodynamically significant (FFR 0.72). Mildly elevated left and right heart filling pressures. Normal Fick cardiac output   Recommendations: Admit for medical optimization and cardiac surgery consultation for bypass, given the patient's multivessel CAD, diabetes mellitus, and reduced LVEF. Gentle hydration tonight. Restart diuresis tomorrow based on renal function. Aggressive secondary prevention.    Recent Labs: 01/16/2022: TSH 2.570 10/28/2022: Magnesium 2.0 11/04/2022: B Natriuretic Peptide 560.3 11/06/2022: ALT 13; BUN 93; Creatinine, Ser 4.33; Potassium 3.3; Sodium 142 11/10/2022: Hemoglobin 9.1; Platelet Count 181  Recent Lipid Panel    Component Value Date/Time   CHOL 112 11/19/2020 0827   TRIG 94 11/19/2020 0827   HDL 49 11/19/2020 0827   CHOLHDL 2.3 11/19/2020 0827   CHOLHDL 4.5 11/22/2017 0354   VLDL 44 (H) 11/22/2017 0354   LDLCALC 45 11/19/2020 0827     Risk Assessment/Calculations:    CHA2DS2-VASc Score = 6  This indicates a 9.7% annual risk of stroke. The patient's score is based upon: CHF History: 1 HTN History: 1 Diabetes History: 1 Stroke History: 0 Vascular Disease History: 0 Age Score: 2 Gender Score: 1    Physical Exam:    VS:  There were no vitals taken for this visit.    Wt Readings from Last 3 Encounters:  11/19/22 147 lb 11.3 oz (67 kg)  11/10/22 148 lb 9.6 oz (67.4 kg)  11/06/22  143 lb 1.3 oz (64.9 kg)     GEN:  Well nourished, well developed in no acute distress HEENT: Normal NECK: No JVD; No carotid bruits CARDIAC: Irregular RR, soft systolic murmur. No rubs, gallops RESPIRATORY:  Clear to auscultation without rales, wheezing or rhonchi  ABDOMEN: Soft, non-tender, non-distended MUSCULOSKELETAL:  No edema; No deformity. 2+ pedal pulses, equal bilaterally SKIN: Warm and dry NEUROLOGIC:  Alert and oriented x 3 PSYCHIATRIC:  Normal affect   EKG:  EKG is not ordered today.    Diagnoses:    No diagnosis found.  Assessment and Plan:     Acute on chronic combined CHF:   CKD: CAD without angina: Significant multivessel CAD on cath 09/2017. Felt too high risk for CABG. S/p DES to proximal to mid RCA lesion which was 90% stenosed, consideration for staged PCI to LAD at that time, has continued medical therapy. She denies chest pain, dyspnea, or other symptoms concerning for angina.  No indication for further ischemic evaluation at this time.  Not on aspirin, due to [facility]. Continue Plavix, atorvastatin, Jardiance, isosorbide, metoprolol, valsartan. Will recheck lipids at next office visit.   PAF: Has likely run out of Eliquis due to cost. I emphasized the high risk of stroke off oral anticoagulation. Provided options for her to consider including Coumadin, Xarelto, and Pradaxa.  Asked her to call her insurance company for preferred medication. Advised that coumadin therapy requires careful monitoring with routine in-office INR checks. One box of Eliquis samples provided and I have asked her to call tomorrow to determine further management.  She denies worsening symptoms of A-fib.  Aortic stenosis: Mild to moderate aortic valve stenosis with mean gradient 17 mmHg on echo 04/2022.  Soft murmur on exam. She denies dyspnea, chest pain, presyncope, syncope, palpitations. We will continue to monitor clinically for now with repeat echo in 1 year.   Leg edema: Continues to have  intermittent leg edema which has improved with leg elevation. She increases diuretic for 3 days  on occasion and symptoms improve. No significant swelling today.   Hypertension: BP is well-controlled.  She does not monitor on a consistent basis.  No medication changes today.   Disposition: ***  Medication Adjustments/Labs and Tests Ordered: Current medicines are reviewed at length with the patient today.  Concerns regarding medicines are outlined above.  No orders of the defined types were placed in this encounter.  No orders of the defined types were placed in this encounter.   There are no Patient Instructions on file for this visit.   Signed, Emmaline Life, NP  11/28/2022 5:19 AM    [place]

## 2022-11-28 NOTE — Telephone Encounter (Signed)
S/w Sheran Luz at [facility] @ [phone] pt has not been seen in office. Will send to [facility] to [place].

## 2022-11-29 ENCOUNTER — Other Ambulatory Visit: Payer: Self-pay | Admitting: Physical Medicine and Rehabilitation

## 2022-11-29 DIAGNOSIS — R569 Unspecified convulsions: Secondary | ICD-10-CM

## 2022-12-05 ENCOUNTER — Telehealth: Payer: Self-pay | Admitting: Cardiovascular Disease

## 2022-12-05 NOTE — Telephone Encounter (Signed)
Patient calling in to report leg swelling and foot pain, states she wants to be seen today. Patient states it leaves dents when she presses her fingers into the swollen area of her lower legs/feet. She states she is a little SOB but it improves with rest. Patient reports she fell last week 12/22 and again on 12/27. She reports a hip fracture in recent months for which she just started outpatient physical therapy, she is currently using a walker. She states she thinks her legs were a little swollen on 12/27 when she fell the second time. On 12/4 at oncology visit with notes state trace bilateral edema. Patient also had TEE this month on 12/13. She currently has no diuretics on her medication list.  Last Cr on 11/30 was 4.33. Telephone note from [employer] states "S/w Lattie Haw and [place] at [facility] @ [phone] pt has not been seen in office." Patient is unable to recall if she had any repeat lab work or any other follow up visits with any of her physicians about her Cr. Her last recorded weight in epic was 148 lbs at her oncology visit on 12/4. Today she reports her weight is 156 lbs. BP 105/43 and HR 73 on her home blood pressure machine.   I advised patient to go to the ER as I was concerned for her unaddressed Creatinine. I advised that we would not be able to give her diuretics without a recheck of her lab work. I provided education that an elevated creatinine could be contributing to her falls. Patient agrees to report to ER.

## 2022-12-05 NOTE — Telephone Encounter (Signed)
Pt c/o swelling: STAT is pt has developed SOB within 24 hours  If swelling, where is the swelling located?   Feet  How much weight have you gained and in what time span? Unknown  Have you gained 3 pounds in a day or 5 pounds in a week?   No  Do you have a log of your daily weights (if so, list)? No  Are you currently taking a fluid pill?   No  Are you currently SOB?   A little  Have you traveled recently?   No  Patient states her legs are hurting and she is in pain.

## 2022-12-06 ENCOUNTER — Other Ambulatory Visit: Payer: Self-pay

## 2022-12-06 ENCOUNTER — Emergency Department (HOSPITAL_COMMUNITY): Payer: Medicare HMO

## 2022-12-06 ENCOUNTER — Inpatient Hospital Stay (HOSPITAL_COMMUNITY)
Admission: EM | Admit: 2022-12-06 | Discharge: 2022-12-26 | DRG: 673 | Disposition: A | Discharge status: Home Health | Payer: Medicare HMO | Attending: Internal Medicine | Admitting: Internal Medicine

## 2022-12-06 ENCOUNTER — Encounter (HOSPITAL_COMMUNITY): Payer: Self-pay

## 2022-12-06 DIAGNOSIS — R5383 Other fatigue: Secondary | ICD-10-CM | POA: Diagnosis not present

## 2022-12-06 DIAGNOSIS — Z961 Presence of intraocular lens: Secondary | ICD-10-CM | POA: Diagnosis present

## 2022-12-06 DIAGNOSIS — N2581 Secondary hyperparathyroidism of renal origin: Secondary | ICD-10-CM | POA: Diagnosis present

## 2022-12-06 DIAGNOSIS — N179 Acute kidney failure, unspecified: Secondary | ICD-10-CM | POA: Diagnosis not present

## 2022-12-06 DIAGNOSIS — Z79899 Other long term (current) drug therapy: Secondary | ICD-10-CM

## 2022-12-06 DIAGNOSIS — D631 Anemia in chronic kidney disease: Secondary | ICD-10-CM | POA: Diagnosis present

## 2022-12-06 DIAGNOSIS — I96 Gangrene, not elsewhere classified: Secondary | ICD-10-CM | POA: Diagnosis not present

## 2022-12-06 DIAGNOSIS — N3000 Acute cystitis without hematuria: Secondary | ICD-10-CM | POA: Diagnosis present

## 2022-12-06 DIAGNOSIS — I480 Paroxysmal atrial fibrillation: Secondary | ICD-10-CM | POA: Diagnosis present

## 2022-12-06 DIAGNOSIS — G8929 Other chronic pain: Secondary | ICD-10-CM | POA: Diagnosis present

## 2022-12-06 DIAGNOSIS — Z515 Encounter for palliative care: Secondary | ICD-10-CM

## 2022-12-06 DIAGNOSIS — C9201 Acute myeloblastic leukemia, in remission: Secondary | ICD-10-CM | POA: Diagnosis present

## 2022-12-06 DIAGNOSIS — E11621 Type 2 diabetes mellitus with foot ulcer: Secondary | ICD-10-CM | POA: Diagnosis present

## 2022-12-06 DIAGNOSIS — M25562 Pain in left knee: Secondary | ICD-10-CM | POA: Diagnosis not present

## 2022-12-06 DIAGNOSIS — Z87898 Personal history of other specified conditions: Secondary | ICD-10-CM | POA: Diagnosis not present

## 2022-12-06 DIAGNOSIS — I70222 Atherosclerosis of native arteries of extremities with rest pain, left leg: Secondary | ICD-10-CM | POA: Diagnosis present

## 2022-12-06 DIAGNOSIS — E1151 Type 2 diabetes mellitus with diabetic peripheral angiopathy without gangrene: Secondary | ICD-10-CM | POA: Diagnosis present

## 2022-12-06 DIAGNOSIS — I959 Hypotension, unspecified: Secondary | ICD-10-CM | POA: Diagnosis not present

## 2022-12-06 DIAGNOSIS — Z9841 Cataract extraction status, right eye: Secondary | ICD-10-CM

## 2022-12-06 DIAGNOSIS — Z8249 Family history of ischemic heart disease and other diseases of the circulatory system: Secondary | ICD-10-CM

## 2022-12-06 DIAGNOSIS — J9811 Atelectasis: Secondary | ICD-10-CM | POA: Diagnosis not present

## 2022-12-06 DIAGNOSIS — L97529 Non-pressure chronic ulcer of other part of left foot with unspecified severity: Secondary | ICD-10-CM | POA: Diagnosis present

## 2022-12-06 DIAGNOSIS — Z1152 Encounter for screening for COVID-19: Secondary | ICD-10-CM

## 2022-12-06 DIAGNOSIS — Z79811 Long term (current) use of aromatase inhibitors: Secondary | ICD-10-CM

## 2022-12-06 DIAGNOSIS — Z853 Personal history of malignant neoplasm of breast: Secondary | ICD-10-CM

## 2022-12-06 DIAGNOSIS — Z8542 Personal history of malignant neoplasm of other parts of uterus: Secondary | ICD-10-CM

## 2022-12-06 DIAGNOSIS — R609 Edema, unspecified: Secondary | ICD-10-CM | POA: Diagnosis not present

## 2022-12-06 DIAGNOSIS — Z043 Encounter for examination and observation following other accident: Secondary | ICD-10-CM | POA: Diagnosis not present

## 2022-12-06 DIAGNOSIS — R531 Weakness: Secondary | ICD-10-CM | POA: Diagnosis not present

## 2022-12-06 DIAGNOSIS — J449 Chronic obstructive pulmonary disease, unspecified: Secondary | ICD-10-CM | POA: Diagnosis present

## 2022-12-06 DIAGNOSIS — I2510 Atherosclerotic heart disease of native coronary artery without angina pectoris: Secondary | ICD-10-CM | POA: Diagnosis present

## 2022-12-06 DIAGNOSIS — I5033 Acute on chronic diastolic (congestive) heart failure: Secondary | ICD-10-CM | POA: Diagnosis present

## 2022-12-06 DIAGNOSIS — E785 Hyperlipidemia, unspecified: Secondary | ICD-10-CM | POA: Diagnosis not present

## 2022-12-06 DIAGNOSIS — Z8049 Family history of malignant neoplasm of other genital organs: Secondary | ICD-10-CM

## 2022-12-06 DIAGNOSIS — E11649 Type 2 diabetes mellitus with hypoglycemia without coma: Secondary | ICD-10-CM | POA: Diagnosis present

## 2022-12-06 DIAGNOSIS — Z862 Personal history of diseases of the blood and blood-forming organs and certain disorders involving the immune mechanism: Secondary | ICD-10-CM | POA: Diagnosis not present

## 2022-12-06 DIAGNOSIS — N184 Chronic kidney disease, stage 4 (severe): Secondary | ICD-10-CM | POA: Diagnosis present

## 2022-12-06 DIAGNOSIS — Z7189 Other specified counseling: Secondary | ICD-10-CM | POA: Diagnosis not present

## 2022-12-06 DIAGNOSIS — E7800 Pure hypercholesterolemia, unspecified: Secondary | ICD-10-CM | POA: Diagnosis present

## 2022-12-06 DIAGNOSIS — Z800 Family history of malignant neoplasm of digestive organs: Secondary | ICD-10-CM

## 2022-12-06 DIAGNOSIS — G9389 Other specified disorders of brain: Secondary | ICD-10-CM | POA: Diagnosis not present

## 2022-12-06 DIAGNOSIS — E1122 Type 2 diabetes mellitus with diabetic chronic kidney disease: Secondary | ICD-10-CM | POA: Diagnosis present

## 2022-12-06 DIAGNOSIS — E782 Mixed hyperlipidemia: Secondary | ICD-10-CM | POA: Diagnosis not present

## 2022-12-06 DIAGNOSIS — B961 Klebsiella pneumoniae [K. pneumoniae] as the cause of diseases classified elsewhere: Secondary | ICD-10-CM | POA: Diagnosis present

## 2022-12-06 DIAGNOSIS — D638 Anemia in other chronic diseases classified elsewhere: Secondary | ICD-10-CM | POA: Diagnosis not present

## 2022-12-06 DIAGNOSIS — Z803 Family history of malignant neoplasm of breast: Secondary | ICD-10-CM

## 2022-12-06 DIAGNOSIS — Y929 Unspecified place or not applicable: Secondary | ICD-10-CM | POA: Diagnosis not present

## 2022-12-06 DIAGNOSIS — Z7901 Long term (current) use of anticoagulants: Secondary | ICD-10-CM

## 2022-12-06 DIAGNOSIS — R569 Unspecified convulsions: Secondary | ICD-10-CM | POA: Diagnosis present

## 2022-12-06 DIAGNOSIS — R0600 Dyspnea, unspecified: Secondary | ICD-10-CM | POA: Diagnosis not present

## 2022-12-06 DIAGNOSIS — I5032 Chronic diastolic (congestive) heart failure: Secondary | ICD-10-CM | POA: Diagnosis not present

## 2022-12-06 DIAGNOSIS — Z9842 Cataract extraction status, left eye: Secondary | ICD-10-CM

## 2022-12-06 DIAGNOSIS — Z806 Family history of leukemia: Secondary | ICD-10-CM

## 2022-12-06 DIAGNOSIS — M47816 Spondylosis without myelopathy or radiculopathy, lumbar region: Secondary | ICD-10-CM | POA: Diagnosis not present

## 2022-12-06 DIAGNOSIS — L97522 Non-pressure chronic ulcer of other part of left foot with fat layer exposed: Secondary | ICD-10-CM | POA: Diagnosis not present

## 2022-12-06 DIAGNOSIS — E8809 Other disorders of plasma-protein metabolism, not elsewhere classified: Secondary | ICD-10-CM | POA: Diagnosis not present

## 2022-12-06 DIAGNOSIS — S3993XA Unspecified injury of pelvis, initial encounter: Secondary | ICD-10-CM | POA: Diagnosis not present

## 2022-12-06 DIAGNOSIS — I082 Rheumatic disorders of both aortic and tricuspid valves: Secondary | ICD-10-CM | POA: Diagnosis present

## 2022-12-06 DIAGNOSIS — M25552 Pain in left hip: Secondary | ICD-10-CM | POA: Diagnosis not present

## 2022-12-06 DIAGNOSIS — E1169 Type 2 diabetes mellitus with other specified complication: Secondary | ICD-10-CM

## 2022-12-06 DIAGNOSIS — M545 Low back pain, unspecified: Secondary | ICD-10-CM | POA: Diagnosis not present

## 2022-12-06 DIAGNOSIS — Z7902 Long term (current) use of antithrombotics/antiplatelets: Secondary | ICD-10-CM

## 2022-12-06 DIAGNOSIS — L0390 Cellulitis, unspecified: Secondary | ICD-10-CM | POA: Diagnosis not present

## 2022-12-06 DIAGNOSIS — E1165 Type 2 diabetes mellitus with hyperglycemia: Secondary | ICD-10-CM | POA: Diagnosis present

## 2022-12-06 DIAGNOSIS — I70229 Atherosclerosis of native arteries of extremities with rest pain, unspecified extremity: Secondary | ICD-10-CM | POA: Diagnosis not present

## 2022-12-06 DIAGNOSIS — Z87891 Personal history of nicotine dependence: Secondary | ICD-10-CM

## 2022-12-06 DIAGNOSIS — W1830XA Fall on same level, unspecified, initial encounter: Secondary | ICD-10-CM | POA: Diagnosis present

## 2022-12-06 DIAGNOSIS — Z825 Family history of asthma and other chronic lower respiratory diseases: Secondary | ICD-10-CM

## 2022-12-06 DIAGNOSIS — Z1611 Resistance to penicillins: Secondary | ICD-10-CM | POA: Diagnosis present

## 2022-12-06 DIAGNOSIS — Z955 Presence of coronary angioplasty implant and graft: Secondary | ICD-10-CM

## 2022-12-06 DIAGNOSIS — M549 Dorsalgia, unspecified: Secondary | ICD-10-CM | POA: Diagnosis not present

## 2022-12-06 DIAGNOSIS — J90 Pleural effusion, not elsewhere classified: Secondary | ICD-10-CM | POA: Diagnosis not present

## 2022-12-06 DIAGNOSIS — I4891 Unspecified atrial fibrillation: Secondary | ICD-10-CM | POA: Diagnosis not present

## 2022-12-06 DIAGNOSIS — M48061 Spinal stenosis, lumbar region without neurogenic claudication: Secondary | ICD-10-CM | POA: Diagnosis not present

## 2022-12-06 DIAGNOSIS — Z794 Long term (current) use of insulin: Secondary | ICD-10-CM | POA: Diagnosis not present

## 2022-12-06 DIAGNOSIS — N189 Chronic kidney disease, unspecified: Secondary | ICD-10-CM | POA: Diagnosis not present

## 2022-12-06 DIAGNOSIS — E13621 Other specified diabetes mellitus with foot ulcer: Secondary | ICD-10-CM | POA: Diagnosis not present

## 2022-12-06 DIAGNOSIS — M2578 Osteophyte, vertebrae: Secondary | ICD-10-CM | POA: Diagnosis not present

## 2022-12-06 DIAGNOSIS — S60412A Abrasion of right middle finger, initial encounter: Secondary | ICD-10-CM | POA: Diagnosis present

## 2022-12-06 DIAGNOSIS — E119 Type 2 diabetes mellitus without complications: Secondary | ICD-10-CM

## 2022-12-06 DIAGNOSIS — Z66 Do not resuscitate: Secondary | ICD-10-CM | POA: Diagnosis present

## 2022-12-06 DIAGNOSIS — K5900 Constipation, unspecified: Secondary | ICD-10-CM | POA: Diagnosis not present

## 2022-12-06 DIAGNOSIS — I130 Hypertensive heart and chronic kidney disease with heart failure and stage 1 through stage 4 chronic kidney disease, or unspecified chronic kidney disease: Secondary | ICD-10-CM | POA: Diagnosis present

## 2022-12-06 DIAGNOSIS — I252 Old myocardial infarction: Secondary | ICD-10-CM

## 2022-12-06 DIAGNOSIS — D649 Anemia, unspecified: Secondary | ICD-10-CM | POA: Diagnosis not present

## 2022-12-06 DIAGNOSIS — I739 Peripheral vascular disease, unspecified: Secondary | ICD-10-CM | POA: Diagnosis not present

## 2022-12-06 DIAGNOSIS — J811 Chronic pulmonary edema: Secondary | ICD-10-CM | POA: Diagnosis not present

## 2022-12-06 DIAGNOSIS — Z808 Family history of malignant neoplasm of other organs or systems: Secondary | ICD-10-CM

## 2022-12-06 DIAGNOSIS — M2012 Hallux valgus (acquired), left foot: Secondary | ICD-10-CM | POA: Diagnosis not present

## 2022-12-06 DIAGNOSIS — N390 Urinary tract infection, site not specified: Secondary | ICD-10-CM | POA: Diagnosis not present

## 2022-12-06 DIAGNOSIS — E872 Acidosis, unspecified: Secondary | ICD-10-CM | POA: Diagnosis present

## 2022-12-06 DIAGNOSIS — R54 Age-related physical debility: Secondary | ICD-10-CM | POA: Diagnosis present

## 2022-12-06 DIAGNOSIS — Z801 Family history of malignant neoplasm of trachea, bronchus and lung: Secondary | ICD-10-CM

## 2022-12-06 DIAGNOSIS — E876 Hypokalemia: Secondary | ICD-10-CM | POA: Diagnosis not present

## 2022-12-06 DIAGNOSIS — R0602 Shortness of breath: Secondary | ICD-10-CM | POA: Diagnosis not present

## 2022-12-06 DIAGNOSIS — I70245 Atherosclerosis of native arteries of left leg with ulceration of other part of foot: Secondary | ICD-10-CM | POA: Diagnosis not present

## 2022-12-06 DIAGNOSIS — M79662 Pain in left lower leg: Secondary | ICD-10-CM | POA: Diagnosis not present

## 2022-12-06 DIAGNOSIS — Z823 Family history of stroke: Secondary | ICD-10-CM

## 2022-12-06 DIAGNOSIS — M7732 Calcaneal spur, left foot: Secondary | ICD-10-CM | POA: Diagnosis not present

## 2022-12-06 DIAGNOSIS — N281 Cyst of kidney, acquired: Secondary | ICD-10-CM | POA: Diagnosis not present

## 2022-12-06 LAB — CBG MONITORING, ED: Glucose-Capillary: 131 mg/dL — ABNORMAL HIGH (ref 70–99)

## 2022-12-06 NOTE — ED Notes (Signed)
Sister Melynda Ripple [phone] would like an update asap

## 2022-12-06 NOTE — ED Triage Notes (Signed)
Increased generalized wekaness/malaise/dizziness x2days. HX CHF

## 2022-12-06 NOTE — ED Notes (Signed)
RN attempted to place IV, unsuccessful.

## 2022-12-06 NOTE — ED Provider Notes (Signed)
[facility] EMERGENCY DEPARTMENT Provider Note   CSN: [id number] Arrival date & time: 12/06/22  2100     History  Chief Complaint  Patient presents with   Weakness    [name] is a 81 y.o. female.  Patient here with generalized weakness.  Pain all over her body.  She has a history of hypertension, high cholesterol, CAD, diabetes, atrial flutter, CKD, leukemia history.  She has a port.  She is on Eliquis.  Maybe she had a fall yesterday and hurt her buttocks.  She has chest pain and shortness of breath and overall she just does not feel well.  Nothing makes it worse or better.  The history is provided by the patient.       Home Medications Prior to Admission medications   Medication Sig Start Date End Date Taking? Authorizing Provider  apixaban (ELIQUIS) 2.5 MG TABS tablet Take 1 tablet (2.5 mg total) by mouth 2 (two) times daily. 09/30/22   Angiulli, Lavon Paganini, PA-C  ascorbic acid (VITAMIN C) 1000 MG tablet Take 1 tablet (1,000 mg total) by mouth daily. 09/30/22   Angiulli, Lavon Paganini, PA-C  atorvastatin (LIPITOR) 40 MG tablet Take 1 tablet (40 mg total) by mouth daily. 09/30/22   Angiulli, Lavon Paganini, PA-C  clopidogrel (PLAVIX) 75 MG tablet Take 1 tablet (75 mg total) by mouth every evening. 09/30/22   Angiulli, Lavon Paganini, PA-C  cyanocobalamin (VITAMIN B12) 1000 MCG/ML injection INJECT 1ML IN THE MUSCLE EVERY 30 DAYS AS DIRECTED Patient taking differently: Inject 1,000 mcg into the muscle every 30 (thirty) days. 08/28/22   Isaac Bliss, Rayford Halsted, MD  docusate sodium (COLACE) 100 MG capsule Take 1 capsule (100 mg total) by mouth 2 (two) times daily. 09/30/22   Angiulli, Lavon Paganini, PA-C  fluticasone furoate-vilanterol (BREO ELLIPTA) 200-25 MCG/ACT AEPB Inhale 1 puff into the lungs as needed (wheezing SOB). 09/30/22   Angiulli, Lavon Paganini, PA-C  insulin glargine, 2 Unit Dial, (TOUJEO MAX SOLOSTAR) 300 UNIT/ML Solostar Pen Inject 6 Units into the skin at  bedtime. Patient taking differently: Inject 10-16 Units into the skin See admin instructions. Inject 10 units into the skin at supper and 16 units at bedtime 09/05/22   Florencia Reasons, MD  isosorbide mononitrate (IMDUR) 30 MG 24 hr tablet Take 1 tablet (30 mg total) by mouth daily. 09/30/22   Angiulli, Lavon Paganini, PA-C  letrozole [facility]) 2.5 MG tablet Take 1 tablet (2.5 mg total) by mouth daily. 02/14/22   Owens Shark, NP  levETIRAcetam (KEPPRA) 500 MG tablet TAKE 1 TABLET(500 MG) BY MOUTH TWICE DAILY 12/02/22   Raulkar, Clide Deutscher, MD  LORazepam (ATIVAN) 0.5 MG tablet Take 1 tablet (0.5 mg total) by mouth every 6 (six) hours as needed for anxiety. 09/30/22   Angiulli, Lavon Paganini, PA-C  melatonin 3 MG TABS tablet Take 1 tablet (3 mg total) by mouth at bedtime. Patient taking differently: Take 3 mg by mouth at bedtime. As needed 09/30/22   Angiulli, Lavon Paganini, PA-C  methocarbamol (ROBAXIN) 500 MG tablet Take 1 tablet (500 mg total) by mouth every 4 (four) hours as needed for muscle spasms. 09/30/22   Angiulli, Lavon Paganini, PA-C  metoprolol succinate (TOPROL-XL) 25 MG 24 hr tablet Take 0.5 tablets (12.5 mg total) by mouth daily. Take with or immediately following a meal. 09/30/22   Angiulli, Lavon Paganini, PA-C  Multiple Vitamin (MULTIVITAMIN WITH MINERALS) TABS tablet Take 1 tablet by mouth daily. 09/30/22   Thurman, Lavon Paganini,  PA-C  oxyCODONE (OXY IR/ROXICODONE) 5 MG immediate release tablet Take 1 tablet (5 mg total) by mouth every 4 (four) hours as needed for moderate pain. 09/30/22   Angiulli, Lavon Paganini, PA-C  polyethylene glycol (MIRALAX / GLYCOLAX) 17 g packet Take 17 g by mouth daily. 09/30/22   Angiulli, Lavon Paganini, PA-C  psyllium (HYDROCIL/METAMUCIL) 95 % PACK Take 1 packet by mouth daily. 09/30/22   Angiulli, Lavon Paganini, PA-C      Allergies    Patient has no known allergies.    Review of Systems   Review of Systems  Physical Exam Updated Vital Signs BP (!) 89/56 (BP Location: Right Arm)   Pulse (!) 50    Temp (!) 97.5 F (36.4 C) (Oral)   Resp 18   SpO2 98%  Physical Exam Vitals and nursing note reviewed.  Constitutional:      General: She is not in acute distress.    Appearance: She is well-developed. She is ill-appearing.     Comments: Pale  HENT:     Head: Normocephalic and atraumatic.     Nose: Nose normal.     Mouth/Throat:     Mouth: Mucous membranes are moist.  Eyes:     Extraocular Movements: Extraocular movements intact.     Conjunctiva/sclera: Conjunctivae normal.     Pupils: Pupils are equal, round, and reactive to light.  Cardiovascular:     Rate and Rhythm: Normal rate and regular rhythm.     Pulses: Normal pulses.     Heart sounds: Normal heart sounds. No murmur heard. Pulmonary:     Effort: Pulmonary effort is normal. No respiratory distress.     Breath sounds: Normal breath sounds.  Abdominal:     Palpations: Abdomen is soft.     Tenderness: There is no abdominal tenderness.  Musculoskeletal:        General: No swelling.     Cervical back: Normal range of motion and neck supple.  Skin:    General: Skin is warm and dry.     Capillary Refill: Capillary refill takes less than 2 seconds.  Neurological:     General: No focal deficit present.     Mental Status: She is alert and oriented to person, place, and time.     Cranial Nerves: No cranial nerve deficit.     Sensory: No sensory deficit.     Motor: No weakness.     Coordination: Coordination normal.  Psychiatric:        Mood and Affect: Mood normal.     ED Results / Procedures / Treatments   Labs (all labs ordered are listed, but only abnormal results are displayed) Labs Reviewed  CBG MONITORING, ED - Abnormal; Notable for the following components:      Result Value   Glucose-Capillary 131 (*)    All other components within normal limits  RESP PANEL BY RT-PCR (RSV, FLU A&B, COVID)  [id number]  CBC WITH DIFFERENTIAL/PLATELET  COMPREHENSIVE METABOLIC PANEL  LIPASE, BLOOD  URINALYSIS, ROUTINE W REFLEX  MICROSCOPIC  BRAIN NATRIURETIC PEPTIDE  TROPONIN I (HIGH SENSITIVITY)  TROPONIN I (HIGH SENSITIVITY)    EKG None  Radiology CT Lumbar Spine Wo Contrast  Result Date: 12/06/2022 CLINICAL DATA:  Fall EXAM: CT LUMBAR SPINE WITHOUT CONTRAST TECHNIQUE: Multidetector CT imaging of the lumbar spine was performed without intravenous contrast administration. Multiplanar CT image reconstructions were also generated. RADIATION DOSE REDUCTION: This exam was performed according to the departmental dose-optimization program which includes automated exposure control, adjustment  of the mA and/or kV according to patient size and/or use of iterative reconstruction technique. COMPARISON:  Lumbar spine MRI 08/08/2017 CT abdomen pelvis 04/17/2022 FINDINGS: Segmentation: 5 lumbar type vertebrae. Alignment: Normal Vertebrae: Diffusely abnormal bone marrow with more to multiple Schmorl's nodes, greatest at the superior endplate of L2 and L3. no acute fracture. Paraspinal and other soft tissues: Bilateral pleural effusions, right greater than left. Calcific aortic atherosclerosis. Peripherally calcified low-density pancreatic head mass measures 3.2 cm, decreased in size compared to CT abdomen pelvis 04/17/2022. Disc levels: No spinal canal stenosis. Severe left L5 neural foraminal stenosis. IMPRESSION: 1. Diffusely abnormal bone marrow, unchanged. This may be a sequela of leukemia treatment. 2. No acute fracture. 3. No spinal canal stenosis. Severe left L5 neural foraminal stenosis. 4. Bilateral pleural effusions, right greater than left. 5. Peripherally calcified low-density pancreatic head lesion measures 3.2 cm, decreased in size compared to CT abdomen pelvis 04/17/2022. Aortic Atherosclerosis (ICD10-I70.0). Electronically Signed   By: Ulyses Jarred M.D.   On: 12/06/2022 23:11   CT Head Wo Contrast  Result Date: 12/06/2022 CLINICAL DATA:  Status post fall. EXAM: CT HEAD WITHOUT CONTRAST CT CERVICAL SPINE WITHOUT  CONTRAST TECHNIQUE: Multidetector CT imaging of the head and cervical spine was performed following the standard protocol without intravenous contrast. Multiplanar CT image reconstructions of the cervical spine were also generated. RADIATION DOSE REDUCTION: This exam was performed according to the departmental dose-optimization program which includes automated exposure control, adjustment of the mA and/or kV according to patient size and/or use of iterative reconstruction technique. COMPARISON:  August 29, 2022 FINDINGS: CT HEAD FINDINGS Brain: There is mild cerebral atrophy with widening of the extra-axial spaces and ventricular dilatation. There are areas of decreased attenuation within the white matter tracts of the supratentorial brain, consistent with microvascular disease changes. A stable 1.6 cm x 1.5 cm x 1.6 cm focal calcification is seen along the anterior aspect of the interhemispheric fissure. Vascular: No hyperdense vessels are identified. Skull: Normal. Negative for fracture or focal lesion. Sinuses/Orbits: No acute finding. Other: None. CT CERVICAL SPINE FINDINGS Alignment: Normal. Skull base and vertebrae: No acute fracture. Ill-defined lytic and sclerotic areas are seen scattered throughout the visualized osseous structures. Soft tissues and spinal canal: No prevertebral fluid or swelling. No visible canal hematoma. Disc levels: Mild anterior osteophyte formation is seen at the levels of C3-C4, C4-C5, C5-C6 and C6-C7. There is moderate severity narrowing of the anterior atlantoaxial articulation. Mild multilevel intervertebral disc space narrowing is seen. Bilateral moderate severity multilevel facet joint hypertrophy is noted. Upper chest: Posterior right apical airspace disease is noted with an adjacent pleural effusion. Other: N/A IMPRESSION: 1. No acute intracranial abnormality. 2. Findings suggestive of a stable calcified meningioma along the anterior aspect of the interhemispheric fissure.  3. No acute fracture or subluxation of the cervical spine. 4. Ill-defined lytic and sclerotic areas scattered throughout the visualized osseous structures, concerning for osseous metastatic disease. 5. Posterior right apical airspace disease with an adjacent pleural effusion. Electronically Signed   By: Virgina Norfolk M.D.   On: 12/06/2022 22:31   CT Cervical Spine Wo Contrast  Result Date: 12/06/2022 CLINICAL DATA:  Status post fall. EXAM: CT HEAD WITHOUT CONTRAST CT CERVICAL SPINE WITHOUT CONTRAST TECHNIQUE: Multidetector CT imaging of the head and cervical spine was performed following the standard protocol without intravenous contrast. Multiplanar CT image reconstructions of the cervical spine were also generated. RADIATION DOSE REDUCTION: This exam was performed according to the departmental dose-optimization program which includes automated  exposure control, adjustment of the mA and/or kV according to patient size and/or use of iterative reconstruction technique. COMPARISON:  August 29, 2022 FINDINGS: CT HEAD FINDINGS Brain: There is mild cerebral atrophy with widening of the extra-axial spaces and ventricular dilatation. There are areas of decreased attenuation within the white matter tracts of the supratentorial brain, consistent with microvascular disease changes. A stable 1.6 cm x 1.5 cm x 1.6 cm focal calcification is seen along the anterior aspect of the interhemispheric fissure. Vascular: No hyperdense vessels are identified. Skull: Normal. Negative for fracture or focal lesion. Sinuses/Orbits: No acute finding. Other: None. CT CERVICAL SPINE FINDINGS Alignment: Normal. Skull base and vertebrae: No acute fracture. Ill-defined lytic and sclerotic areas are seen scattered throughout the visualized osseous structures. Soft tissues and spinal canal: No prevertebral fluid or swelling. No visible canal hematoma. Disc levels: Mild anterior osteophyte formation is seen at the levels of C3-C4, C4-C5,  C5-C6 and C6-C7. There is moderate severity narrowing of the anterior atlantoaxial articulation. Mild multilevel intervertebral disc space narrowing is seen. Bilateral moderate severity multilevel facet joint hypertrophy is noted. Upper chest: Posterior right apical airspace disease is noted with an adjacent pleural effusion. Other: N/A IMPRESSION: 1. No acute intracranial abnormality. 2. Findings suggestive of a stable calcified meningioma along the anterior aspect of the interhemispheric fissure. 3. No acute fracture or subluxation of the cervical spine. 4. Ill-defined lytic and sclerotic areas scattered throughout the visualized osseous structures, concerning for osseous metastatic disease. 5. Posterior right apical airspace disease with an adjacent pleural effusion. Electronically Signed   By: Virgina Norfolk M.D.   On: 12/06/2022 22:31   DG Pelvis Portable  Result Date: 12/06/2022 CLINICAL DATA:  Trauma, fall EXAM: PORTABLE PELVIS 1-2 VIEWS COMPARISON:  None Available. FINDINGS: There is previous internal fixation right femur with intramedullary rod. No definite recent displaced fracture is seen. There is joint space narrowing in both hips. Arterial calcifications are seen in soft tissues. Degenerative changes are noted in the visualized lower lumbar spine. IMPRESSION: No recent displaced fracture is seen. Previous internal fixation in right femur. Lumbar spondylosis.  Arteriosclerosis. Electronically Signed   By: Elmer Picker M.D.   On: 12/06/2022 21:58   DG Chest Portable 1 View  Result Date: 12/06/2022 CLINICAL DATA:  Recent fall with weakness, initial encounter EXAM: PORTABLE CHEST 1 VIEW COMPARISON:  11/06/2022 FINDINGS: Cardiac shadow is enlarged but stable. Aortic calcifications are again seen. Chest wall port is again noted on the right and stable. The lungs are well aerated bilaterally. No focal infiltrate is seen. Chronic scarring is noted in the left medial lung base. No acute bony  abnormality is noted. IMPRESSION: No acute abnormality seen. Electronically Signed   By: Inez Catalina M.D.   On: 12/06/2022 21:57    Procedures Procedures    Medications Ordered in ED Medications - No data to display  ED Course/ Medical Decision Making/ A&P                           Medical Decision Making Amount and/or Complexity of Data Reviewed Labs: ordered. Radiology: ordered.   [name] is here with generalized weakness and bodyaches.  History of COPD, hypertension, high cholesterol, CAD, leukemia in remission, breast cancer.  Patient overall with generalized weakness and pain throughout.  She overall has no real focal symptoms.  Differential is wide including cardiac process or pulmonary process.  She is on blood thinner suspect less likely PE.  Could be CAD or infectious process like COVID or flu.  Could be UTI.  Could be acute on chronic pain from her history of leukemia with some bony disease.  Ultimately she arrives with unremarkable vitals.  Triage vital had a blood pressure 89/56 but this was quickly improved and blood pressure is 110/80 on my evaluation.  EKG shows atrial fibrillation with a rate of 50.  She does not have a fever.  She looks pale.  She denies any melena or hematochezia.  Overall we will pursue broad workup with CBC, CMP, lipase, BMP, troponin, chest x-ray, viral swab, urinalysis.  Will get a CT scan of her head, neck, low back and pursue chest x-ray and pelvic x-ray.  Supposedly she might have a fall yesterday and landed on her low back.  Ultimately CT images were done and per my review interpretation there does not appear to be any acute findings.  Some sequelae of cancerous processes.  No obvious pneumonia.  No obvious fracture.  Patient handed off to oncoming ED staff with patient pending blood work and reevaluation.  This chart was dictated using voice recognition software.  Despite best efforts to proofread,  errors can occur which can change the  documentation meaning.         Final Clinical Impression(s) / ED Diagnoses Final diagnoses:  Weakness    Rx / DC Orders ED Discharge Orders     None         Lennice Sites, DO 12/06/22 2338

## 2022-12-06 NOTE — ED Notes (Signed)
Went to patients room to obtain IV access, patient request for port to be accessed instead. Explained to patient RN's are not allowed to access ports at [facility]. Patient verbalized understanding. CT tech reported that imaging did not require IV access. No attempt made at this time. Patient out of unit to CT for ordered imaging.

## 2022-12-07 ENCOUNTER — Encounter (HOSPITAL_COMMUNITY): Payer: Self-pay | Admitting: Internal Medicine

## 2022-12-07 DIAGNOSIS — N184 Chronic kidney disease, stage 4 (severe): Secondary | ICD-10-CM | POA: Diagnosis not present

## 2022-12-07 DIAGNOSIS — I5033 Acute on chronic diastolic (congestive) heart failure: Secondary | ICD-10-CM

## 2022-12-07 DIAGNOSIS — R531 Weakness: Secondary | ICD-10-CM | POA: Diagnosis not present

## 2022-12-07 DIAGNOSIS — N3000 Acute cystitis without hematuria: Secondary | ICD-10-CM

## 2022-12-07 DIAGNOSIS — Z794 Long term (current) use of insulin: Secondary | ICD-10-CM

## 2022-12-07 DIAGNOSIS — E1122 Type 2 diabetes mellitus with diabetic chronic kidney disease: Secondary | ICD-10-CM | POA: Diagnosis not present

## 2022-12-07 DIAGNOSIS — D638 Anemia in other chronic diseases classified elsewhere: Secondary | ICD-10-CM | POA: Diagnosis not present

## 2022-12-07 DIAGNOSIS — E782 Mixed hyperlipidemia: Secondary | ICD-10-CM

## 2022-12-07 DIAGNOSIS — I480 Paroxysmal atrial fibrillation: Secondary | ICD-10-CM | POA: Diagnosis not present

## 2022-12-07 DIAGNOSIS — Z87898 Personal history of other specified conditions: Secondary | ICD-10-CM

## 2022-12-07 LAB — IRON AND TIBC
Iron: 32 ug/dL (ref 28–170)
Saturation Ratios: 29 % (ref 10.4–31.8)
TIBC: 109 ug/dL — ABNORMAL LOW (ref 250–450)
UIBC: 77 ug/dL

## 2022-12-07 LAB — CBC WITH DIFFERENTIAL/PLATELET
Abs Immature Granulocytes: 0.02 10*3/uL (ref 0.00–0.07)
Abs Immature Granulocytes: 0.02 10*3/uL (ref 0.00–0.07)
Basophils Absolute: 0 10*3/uL (ref 0.0–0.1)
Basophils Absolute: 0 10*3/uL (ref 0.0–0.1)
Basophils Relative: 0 %
Basophils Relative: 0 %
Eosinophils Absolute: 0.1 10*3/uL (ref 0.0–0.5)
Eosinophils Absolute: 0.1 10*3/uL (ref 0.0–0.5)
Eosinophils Relative: 2 %
Eosinophils Relative: 2 %
HCT: 23.9 % — ABNORMAL LOW (ref 36.0–46.0)
HCT: 25.6 % — ABNORMAL LOW (ref 36.0–46.0)
Hemoglobin: 7.2 g/dL — ABNORMAL LOW (ref 12.0–15.0)
Hemoglobin: 8.1 g/dL — ABNORMAL LOW (ref 12.0–15.0)
Immature Granulocytes: 0 %
Immature Granulocytes: 1 %
Lymphocytes Relative: 26 %
Lymphocytes Relative: 29 %
Lymphs Abs: 1 10*3/uL (ref 0.7–4.0)
Lymphs Abs: 1.5 10*3/uL (ref 0.7–4.0)
MCH: 29.9 pg (ref 26.0–34.0)
MCH: 30.7 pg (ref 26.0–34.0)
MCHC: 30.1 g/dL (ref 30.0–36.0)
MCHC: 31.6 g/dL (ref 30.0–36.0)
MCV: 97 fL (ref 80.0–100.0)
MCV: 99.2 fL (ref 80.0–100.0)
Monocytes Absolute: 0.3 10*3/uL (ref 0.1–1.0)
Monocytes Absolute: 0.4 10*3/uL (ref 0.1–1.0)
Monocytes Relative: 8 %
Monocytes Relative: 8 %
Neutro Abs: 2.5 10*3/uL (ref 1.7–7.7)
Neutro Abs: 3 10*3/uL (ref 1.7–7.7)
Neutrophils Relative %: 61 %
Neutrophils Relative %: 63 %
Platelets: 124 10*3/uL — ABNORMAL LOW (ref 150–400)
Platelets: 142 10*3/uL — ABNORMAL LOW (ref 150–400)
RBC: 2.41 MIL/uL — ABNORMAL LOW (ref 3.87–5.11)
RBC: 2.64 MIL/uL — ABNORMAL LOW (ref 3.87–5.11)
RDW: 13.8 % (ref 11.5–15.5)
RDW: 13.9 % (ref 11.5–15.5)
WBC: 3.9 10*3/uL — ABNORMAL LOW (ref 4.0–10.5)
WBC: 5 10*3/uL (ref 4.0–10.5)
nRBC: 0 % (ref 0.0–0.2)
nRBC: 0 % (ref 0.0–0.2)

## 2022-12-07 LAB — COMPREHENSIVE METABOLIC PANEL
ALT: 9 U/L (ref 0–44)
ALT: 7 U/L (ref 0–44)
AST: 15 U/L (ref 15–41)
AST: 9 U/L — ABNORMAL LOW (ref 15–41)
Albumin: 2.4 g/dL — ABNORMAL LOW (ref 3.5–5.0)
Albumin: 2.7 g/dL — ABNORMAL LOW (ref 3.5–5.0)
Alkaline Phosphatase: 87 U/L (ref 38–126)
Alkaline Phosphatase: 71 U/L (ref 38–126)
Anion gap: 10 (ref 5–15)
Anion gap: 10 (ref 5–15)
BUN: 73 mg/dL — ABNORMAL HIGH (ref 8–23)
BUN: 75 mg/dL — ABNORMAL HIGH (ref 8–23)
CO2: 14 mmol/L — ABNORMAL LOW (ref 22–32)
CO2: 12 mmol/L — ABNORMAL LOW (ref 22–32)
Calcium: 5.3 mg/dL — CL (ref 8.9–10.3)
Calcium: 5.1 mg/dL — CL (ref 8.9–10.3)
Chloride: 119 mmol/L — ABNORMAL HIGH (ref 98–111)
Chloride: 117 mmol/L — ABNORMAL HIGH (ref 98–111)
Creatinine, Ser: 3.03 mg/dL — ABNORMAL HIGH (ref 0.44–1.00)
Creatinine, Ser: 2.95 mg/dL — ABNORMAL HIGH (ref 0.44–1.00)
GFR, Estimated: 15 mL/min — ABNORMAL LOW (ref >60)
GFR, Estimated: 16 mL/min — ABNORMAL LOW (ref >60)
Glucose, Bld: 120 mg/dL — ABNORMAL HIGH (ref 70–99)
Glucose, Bld: 98 mg/dL (ref 70–99)
Potassium: 4.6 mmol/L (ref 3.5–5.1)
Potassium: 4 mmol/L (ref 3.5–5.1)
Sodium: 143 mmol/L (ref 135–145)
Sodium: 139 mmol/L (ref 135–145)
Total Bilirubin: 0.2 mg/dL — ABNORMAL LOW (ref 0.3–1.2)
Total Bilirubin: 0.6 mg/dL (ref 0.3–1.2)
Total Protein: 5.1 g/dL — ABNORMAL LOW (ref 6.5–8.1)
Total Protein: 5 g/dL — ABNORMAL LOW (ref 6.5–8.1)

## 2022-12-07 LAB — URINALYSIS, ROUTINE W REFLEX MICROSCOPIC
Bilirubin Urine: NEGATIVE
Glucose, UA: NEGATIVE mg/dL
Ketones, ur: NEGATIVE mg/dL
Nitrite: NEGATIVE
Protein, ur: 30 mg/dL — AB
Specific Gravity, Urine: 1.013 (ref 1.005–1.030)
WBC, UA: >50 WBC/hpf — ABNORMAL HIGH (ref 0–5)
pH: 5 (ref 5.0–8.0)

## 2022-12-07 LAB — BLOOD GAS, VENOUS
Acid-base deficit: 15 mmol/L — ABNORMAL HIGH (ref 0.0–2.0)
Bicarbonate: 13.3 mmol/L — ABNORMAL LOW (ref 20.0–28.0)
O2 Saturation: 61.4 %
Patient temperature: 37
pCO2, Ven: 39 mmHg — ABNORMAL LOW (ref 44–60)
pH, Ven: 7.14 — CL (ref 7.25–7.43)
pO2, Ven: 38 mmHg (ref 32–45)

## 2022-12-07 LAB — MAGNESIUM: Magnesium: 1.4 mg/dL — ABNORMAL LOW (ref 1.7–2.4)

## 2022-12-07 LAB — CBG MONITORING, ED
Glucose-Capillary: 81 mg/dL (ref 70–99)
Glucose-Capillary: 170 mg/dL — ABNORMAL HIGH (ref 70–99)
Glucose-Capillary: 117 mg/dL — ABNORMAL HIGH (ref 70–99)

## 2022-12-07 LAB — TROPONIN I (HIGH SENSITIVITY)
Troponin I (High Sensitivity): 13 ng/L (ref <18)
Troponin I (High Sensitivity): 9 ng/L (ref <18)

## 2022-12-07 LAB — GLUCOSE, CAPILLARY: Glucose-Capillary: 133 mg/dL — ABNORMAL HIGH (ref 70–99)

## 2022-12-07 LAB — RENAL FUNCTION PANEL
Albumin: 2.9 g/dL — ABNORMAL LOW (ref 3.5–5.0)
Anion gap: 12 (ref 5–15)
BUN: 75 mg/dL — ABNORMAL HIGH (ref 8–23)
CO2: 13 mmol/L — ABNORMAL LOW (ref 22–32)
Calcium: 5.6 mg/dL — CL (ref 8.9–10.3)
Chloride: 117 mmol/L — ABNORMAL HIGH (ref 98–111)
Creatinine, Ser: 3.01 mg/dL — ABNORMAL HIGH (ref 0.44–1.00)
GFR, Estimated: 15 mL/min — ABNORMAL LOW (ref >60)
Glucose, Bld: 143 mg/dL — ABNORMAL HIGH (ref 70–99)
Phosphorus: >30 mg/dL — ABNORMAL HIGH (ref 2.5–4.6)
Potassium: 4.6 mmol/L (ref 3.5–5.1)
Sodium: 142 mmol/L (ref 135–145)

## 2022-12-07 LAB — BRAIN NATRIURETIC PEPTIDE: B Natriuretic Peptide: 849.4 pg/mL — ABNORMAL HIGH (ref 0.0–100.0)

## 2022-12-07 LAB — RESP PANEL BY RT-PCR (RSV, FLU A&B, COVID)  RVPGX2
Influenza A by PCR: NEGATIVE
Influenza B by PCR: NEGATIVE
Resp Syncytial Virus by PCR: NEGATIVE
SARS Coronavirus 2 by RT PCR: NEGATIVE

## 2022-12-07 LAB — LIPASE, BLOOD: Lipase: 25 U/L (ref 11–51)

## 2022-12-07 LAB — FERRITIN: Ferritin: 167 ng/mL (ref 11–307)

## 2022-12-07 LAB — TYPE AND SCREEN
ABO/RH(D): O NEG
Antibody Screen: NEGATIVE

## 2022-12-07 LAB — PHOSPHORUS: Phosphorus: 8.4 mg/dL — ABNORMAL HIGH (ref 2.5–4.6)

## 2022-12-07 LAB — PROCALCITONIN: Procalcitonin: <0.1 ng/mL

## 2022-12-07 LAB — HEMOGLOBIN AND HEMATOCRIT, BLOOD
HCT: 26.5 % — ABNORMAL LOW (ref 36.0–46.0)
Hemoglobin: 8.3 g/dL — ABNORMAL LOW (ref 12.0–15.0)

## 2022-12-07 LAB — TSH: TSH: 2.708 u[IU]/mL (ref 0.350–4.500)

## 2022-12-07 MED ORDER — OXYCODONE-ACETAMINOPHEN 5-325 MG PO TABS
1.0000 | ORAL_TABLET | Freq: Four times a day (QID) | ORAL | Status: DC | PRN
  Administered 2022-12-07 (×2): 2 via ORAL
  Administered 2022-12-08 (×2): 1 via ORAL
  Administered 2022-12-08: 2 via ORAL
  Administered 2022-12-08: 1 via ORAL
  Filled 2022-12-07: qty 2
  Filled 2022-12-07: qty 1
  Filled 2022-12-07 (×2): qty 2
  Filled 2022-12-07: qty 1
  Filled 2022-12-07: qty 2

## 2022-12-07 MED ORDER — ALBUMIN HUMAN 25 % IV SOLN
25.0000 g | Freq: Once | INTRAVENOUS | Status: AC
  Administered 2022-12-07: 25 g via INTRAVENOUS
  Filled 2022-12-07: qty 100

## 2022-12-07 MED ORDER — FUROSEMIDE 10 MG/ML IJ SOLN
20.0000 mg | Freq: Once | INTRAMUSCULAR | Status: AC
  Administered 2022-12-07: 20 mg via INTRAVENOUS
  Filled 2022-12-07: qty 2

## 2022-12-07 MED ORDER — LETROZOLE 2.5 MG PO TABS
2.5000 mg | ORAL_TABLET | Freq: Every day | ORAL | Status: DC
  Administered 2022-12-08 – 2022-12-26 (×19): 2.5 mg via ORAL
  Filled 2022-12-07 (×21): qty 1

## 2022-12-07 MED ORDER — CLOPIDOGREL BISULFATE 75 MG PO TABS
75.0000 mg | ORAL_TABLET | Freq: Every evening | ORAL | Status: DC
  Administered 2022-12-07 – 2022-12-25 (×19): 75 mg via ORAL
  Filled 2022-12-07 (×21): qty 1

## 2022-12-07 MED ORDER — ACETAMINOPHEN 325 MG PO TABS
650.0000 mg | ORAL_TABLET | Freq: Four times a day (QID) | ORAL | Status: DC | PRN
  Administered 2022-12-08 – 2022-12-25 (×7): 650 mg via ORAL
  Filled 2022-12-07 (×9): qty 2

## 2022-12-07 MED ORDER — POLYETHYLENE GLYCOL 3350 17 G PO PACK
17.0000 g | PACK | Freq: Every day | ORAL | Status: DC | PRN
  Administered 2022-12-14: 17 g via ORAL
  Filled 2022-12-07: qty 1

## 2022-12-07 MED ORDER — APIXABAN 2.5 MG PO TABS
2.5000 mg | ORAL_TABLET | Freq: Two times a day (BID) | ORAL | Status: DC
  Administered 2022-12-07 – 2022-12-21 (×29): 2.5 mg via ORAL
  Filled 2022-12-07 (×30): qty 1

## 2022-12-07 MED ORDER — ACETAMINOPHEN 650 MG RE SUPP: 650.0000 mg | Freq: Four times a day (QID) | RECTAL | Status: DC | PRN

## 2022-12-07 MED ORDER — ATORVASTATIN CALCIUM 40 MG PO TABS
40.0000 mg | ORAL_TABLET | Freq: Every day | ORAL | Status: DC
  Administered 2022-12-07 – 2022-12-26 (×20): 40 mg via ORAL
  Filled 2022-12-07 (×22): qty 1

## 2022-12-07 MED ORDER — SODIUM BICARBONATE 650 MG PO TABS
650.0000 mg | ORAL_TABLET | Freq: Three times a day (TID) | ORAL | Status: DC
  Administered 2022-12-07 – 2022-12-09 (×7): 650 mg via ORAL
  Filled 2022-12-07 (×7): qty 1

## 2022-12-07 MED ORDER — HYDROCODONE-ACETAMINOPHEN 5-325 MG PO TABS
1.0000 | ORAL_TABLET | Freq: Four times a day (QID) | ORAL | Status: DC | PRN
  Administered 2022-12-07: 1 via ORAL
  Filled 2022-12-07: qty 1

## 2022-12-07 MED ORDER — NALOXONE HCL 0.4 MG/ML IJ SOLN: 0.4000 mg | INTRAMUSCULAR | Status: DC | PRN

## 2022-12-07 MED ORDER — SODIUM CHLORIDE 0.9 % IV SOLN
1.0000 g | INTRAVENOUS | Status: DC
  Administered 2022-12-07 – 2022-12-08 (×2): 1 g via INTRAVENOUS
  Filled 2022-12-07 (×2): qty 10

## 2022-12-07 MED ORDER — INSULIN GLARGINE-YFGN 100 UNIT/ML ~~LOC~~ SOLN
5.0000 [IU] | Freq: Two times a day (BID) | SUBCUTANEOUS | Status: DC
  Administered 2022-12-08 (×2): 5 [IU] via SUBCUTANEOUS
  Filled 2022-12-07 (×6): qty 0.05

## 2022-12-07 MED ORDER — FUROSEMIDE 10 MG/ML IJ SOLN: 40.0000 mg | Freq: Once | INTRAMUSCULAR | Status: DC

## 2022-12-07 MED ORDER — LORAZEPAM 0.5 MG PO TABS
0.5000 mg | ORAL_TABLET | Freq: Four times a day (QID) | ORAL | Status: DC | PRN
  Administered 2022-12-07 – 2022-12-23 (×12): 0.5 mg via ORAL
  Filled 2022-12-07 (×12): qty 1

## 2022-12-07 MED ORDER — CHLORHEXIDINE GLUCONATE CLOTH 2 % EX PADS
6.0000 | MEDICATED_PAD | Freq: Every day | CUTANEOUS | Status: DC
  Administered 2022-12-08 – 2022-12-18 (×11): 6 via TOPICAL

## 2022-12-07 MED ORDER — SODIUM CHLORIDE 0.9 % IV SOLN
2.0000 g | Freq: Once | INTRAVENOUS | Status: AC
  Administered 2022-12-07: 2 g via INTRAVENOUS
  Filled 2022-12-07: qty 20

## 2022-12-07 MED ORDER — MELATONIN 3 MG PO TABS
3.0000 mg | ORAL_TABLET | Freq: Every evening | ORAL | Status: DC | PRN
  Administered 2022-12-07 – 2022-12-22 (×14): 3 mg via ORAL
  Filled 2022-12-07 (×14): qty 1

## 2022-12-07 MED ORDER — CALCIUM GLUCONATE-NACL 1-0.675 GM/50ML-% IV SOLN
1.0000 g | Freq: Once | INTRAVENOUS | Status: AC
  Administered 2022-12-07: 1000 mg via INTRAVENOUS
  Filled 2022-12-07: qty 50

## 2022-12-07 MED ORDER — HYDROCODONE-ACETAMINOPHEN 5-325 MG PO TABS
1.0000 | ORAL_TABLET | ORAL | Status: DC | PRN
  Administered 2022-12-07: 1 via ORAL
  Filled 2022-12-07: qty 1

## 2022-12-07 MED ORDER — LEVETIRACETAM 500 MG PO TABS
500.0000 mg | ORAL_TABLET | Freq: Two times a day (BID) | ORAL | Status: DC
  Administered 2022-12-07 – 2022-12-26 (×39): 500 mg via ORAL
  Filled 2022-12-07 (×39): qty 1

## 2022-12-07 MED ORDER — INSULIN ASPART 100 UNIT/ML IJ SOLN
0.0000 [IU] | Freq: Three times a day (TID) | INTRAMUSCULAR | Status: DC
  Administered 2022-12-07 – 2022-12-10 (×4): 2 [IU] via SUBCUTANEOUS
  Administered 2022-12-10: 1 [IU] via SUBCUTANEOUS
  Administered 2022-12-11: 2 [IU] via SUBCUTANEOUS
  Administered 2022-12-11: 3 [IU] via SUBCUTANEOUS
  Administered 2022-12-12: 2 [IU] via SUBCUTANEOUS
  Administered 2022-12-12: 3 [IU] via SUBCUTANEOUS
  Administered 2022-12-12 – 2022-12-13 (×2): 2 [IU] via SUBCUTANEOUS
  Administered 2022-12-13 – 2022-12-14 (×2): 3 [IU] via SUBCUTANEOUS
  Administered 2022-12-14: 2 [IU] via SUBCUTANEOUS
  Administered 2022-12-15: 1 [IU] via SUBCUTANEOUS
  Administered 2022-12-15 (×2): 2 [IU] via SUBCUTANEOUS
  Administered 2022-12-16: 3 [IU] via SUBCUTANEOUS
  Administered 2022-12-16: 1 [IU] via SUBCUTANEOUS
  Administered 2022-12-16: 2 [IU] via SUBCUTANEOUS
  Administered 2022-12-17 (×2): 3 [IU] via SUBCUTANEOUS
  Administered 2022-12-18: 2 [IU] via SUBCUTANEOUS
  Administered 2022-12-18: 5 [IU] via SUBCUTANEOUS
  Administered 2022-12-18: 1 [IU] via SUBCUTANEOUS
  Administered 2022-12-19 (×2): 2 [IU] via SUBCUTANEOUS
  Administered 2022-12-20: 3 [IU] via SUBCUTANEOUS
  Administered 2022-12-20: 1 [IU] via SUBCUTANEOUS
  Administered 2022-12-20: 2 [IU] via SUBCUTANEOUS
  Administered 2022-12-21: 5 [IU] via SUBCUTANEOUS
  Administered 2022-12-21: 2 [IU] via SUBCUTANEOUS
  Administered 2022-12-22: 3 [IU] via SUBCUTANEOUS
  Administered 2022-12-22: 2 [IU] via SUBCUTANEOUS
  Administered 2022-12-23 – 2022-12-24 (×2): 5 [IU] via SUBCUTANEOUS
  Administered 2022-12-25: 9 [IU] via SUBCUTANEOUS
  Administered 2022-12-25: 1 [IU] via SUBCUTANEOUS
  Administered 2022-12-26: 3 [IU] via SUBCUTANEOUS

## 2022-12-07 NOTE — Hospital Course (Signed)
[name] is a 81 y.o. female with a history of AML, breast cancer, paroxysmal atrial fibrillation on Eliquis, chronic diastolic heart failure, diabetes mellitus type 2, CKD stage IV. Patient presented secondary to generalized weakness and found to have evidence of UTI, hypocalcemia and acute heart failure. Patient started on calcium replacement, empiric Ceftriaxone and given Lasix.

## 2022-12-07 NOTE — Care Management Obs Status (Signed)
[place] NOTIFICATION   Patient Details  Name: [name] MRN: [id number] Date of Birth: March 05, 1941   Medicare Observation Status Notification Given:  Yes    Rodney Booze, LCSW 12/07/2022, 1:21 PM

## 2022-12-07 NOTE — ED Notes (Signed)
Spoke with MD.  Will order calcium.

## 2022-12-07 NOTE — Care Management CC44 (Signed)
Condition Code 44 Documentation Completed  Patient Details  Name: [name] MRN: [id number] Date of Birth: Aug 28, 1941   Condition Code 44 given:  Yes Patient signature on Condition Code 44 notice:  Yes Documentation of 2 MD's agreement:  Yes Code 44 added to claim:  Yes    Rodney Booze, LCSW 12/07/2022, 1:22 PM

## 2022-12-07 NOTE — ED Notes (Signed)
RN contacted MD Howerter about pt current [place] level.

## 2022-12-07 NOTE — ED Provider Notes (Signed)
  Physical Exam  BP (!) 87/44 (BP Location: Right Arm)   Pulse 60   Temp (!) 97.5 F (36.4 C) (Oral)   Resp 20   SpO2 95%   Physical Exam Vitals and nursing note reviewed.  Constitutional:      General: She is not in acute distress.    Appearance: She is well-developed.  HENT:     Head: Normocephalic and atraumatic.  Eyes:     Conjunctiva/sclera: Conjunctivae normal.  Cardiovascular:     Rate and Rhythm: Normal rate and regular rhythm.     Heart sounds: No murmur heard. Pulmonary:     Effort: Pulmonary effort is normal. No respiratory distress.     Breath sounds: Normal breath sounds.  Abdominal:     Palpations: Abdomen is soft.     Tenderness: There is abdominal tenderness.  Musculoskeletal:        General: No swelling.     Cervical back: Neck supple.     Right lower leg: Edema present.     Left lower leg: Edema present.  Skin:    General: Skin is warm and dry.     Capillary Refill: Capillary refill takes less than 2 seconds.     Findings: Lesion present.  Neurological:     Mental Status: She is alert.  Psychiatric:        Mood and Affect: Mood normal.     Procedures  .Critical Care  Performed by: Teressa Lower, MD Authorized by: Teressa Lower, MD   Critical care provider statement:    Critical care time (minutes):  30   Critical care was necessary to treat or prevent imminent or life-threatening deterioration of the following conditions:  Circulatory failure and endocrine crisis   Critical care was time spent personally by me on the following activities:  Development of treatment plan with patient or surrogate, discussions with consultants, evaluation of patient's response to treatment, examination of patient, ordering and review of laboratory studies, ordering and review of radiographic studies, ordering and performing treatments and interventions, pulse oximetry, re-evaluation of patient's condition and review of old charts   ED Course / MDM    Medical  Decision Making Amount and/or Complexity of Data Reviewed Labs: ordered. Radiology: ordered.  Risk Prescription drug management. Decision regarding hospitalization.   Patient received an handoff.  Generalized weakness and pain in the lower extremities and between the shoulder blades.  Imaging at time of signout showing possible new metastatic lesions in the C-spine but x-ray imaging reassuring and no fracture in the L-spine.  Pending laboratory evaluation at time of signout.  Laboratory valuation with a significant hypocalcemia to 5.3, hypoalbuminemia to 2.4, hemoglobin slightly decreased to 8.1, BNP elevated to 849.4 which is an elevation for this patient, high-sensitivity troponin is normal, urinalysis concerning for underlying urinary tract infection with large leuk esterase, greater than 50 white blood cells and many bacteria.  Urine culture sent and ceftriaxone initiated.  Patient did have an episode of hypotension that rapidly corrected with albumin administration.  Diuresis begun with Lasix and calcium repleted with calcium gluconate.  Patient require hospital admission for fluid overload, urinary tract infection and electrolyte repletion.       Teressa Lower, MD 12/07/22 [phone]

## 2022-12-07 NOTE — H&P (Addendum)
History and Physical      [name] LPF:[id number] DOB: 1941/10/18 DOA: 12/06/2022  PCP: Isaac Bliss, Rayford Halsted, MD  Patient coming from: home   I have personally briefly reviewed patient's old medical records in [place]  Chief Complaint: Generalized weakness  HPI: [name] is a 81 y.o. female with medical history significant for AML, breast cancer, paroxysmal atrial fibrillation chronically anticoagulated on Eliquis, chronic diastolic heart failure, type 2 diabetes mellitus, CKD 4 associated baseline creatinine 1.8-3.0, anemia of chronic disease associated baseline hemoglobin 8-10, who is admitted to [facility] on 12/06/2022 with suspected acute cystitis after presenting from home to [facility] ED complaining of generalized weakness.   Patient reports approximately 1 week generalized weakness in the absence of any acute focal weakness.  She notes that this resulted in a ground-level mechanical fall earlier in the day, but notes that she did not hit her head as a component of this fall.  She notes some worsening of her chronic midline low back discomfort after this fall, but denies any new acute focal weakness or any acute physical numbness/paresthesias involving the lower extremities.  In the setting of history of paroxysmal atrial fibrillation she is chronically anticoagulated on Eliquis.  Denies any ensuing headache, or new onset neck discomfort.  Denies any acute dysuria or gross hematuria.  She does however note recent worsening of edema in the bilateral lower extremities with nonproductive cough, in the absence of associated shortness of breath, orthopnea, or PND.  No recent chest pain, palpitations, diaphoresis.   She has a reported history of both AML as well as breast cancer, both reported to be in remission.  She also has a history of chronic diastolic heart failure, with most recent complete echo performed on 10/16/22, which is notable for LVEF 60 to 65%, no focal  motion normalities, indeterminate diastolic parameters, normal right ventricular systolic function, moderately dilated left atrium, moderate to severe tricuspid regurgitation, mild to moderate aortic stenosis.  Medical history also notable for stage IV CKD with baseline creatinine 1.8-3.0, with most recent prior creatinine data points noted to be 4.33 on 11/06/2022 as well as 3.56 on 11/04/2022.  Does not appear to be on any scheduled diuretic medications as an outpatient.     ED Course:  Vital signs in the ED were notable for the following: Atrial fibrillation, heart rates in the 50s and 60s; stop blood pressures initially in the high 80s, with ensuing improvement into the 110s mmHg following dose of albumin, as further detailed below; respiratory rate 18-20, oxygen saturation 95 to 90% on room air.  Labs were notable for the following: CMP notable for potassium 4.6, creatinine 3.03, glucose 120, calcium, adjusted for mild hypoalbuminemia noted to be 6.5, albumin 2.4, otherwise liver enzymes within normal limits.  BNP 849 compared to 560 on 11/04/2022.  Troponin high noted to be 13.  CBC notable for the following: Open cell count 5000, hemoglobin 8.1 associated with normocytic/normochromic properties as well as nonelevated RDW, relative to most recent prior hemoglobin of 9.1 on 11/10/2022.  Urinalysis associated with cloudy appearing specimen notable for greater than 50 white blood cells, many bacteria, large leukocyte esterase, discussed epithelial cells as well as showing 30 protein.  COVID, influenza, RSV PCR negative.  Urine culture pending.  Per my interpretation, EKG in ED demonstrated the following: Low voltage study, which appears to show atrial fibrillation with heart rate 61, without overt T wave or ST changes, including no overt ST elevation.  Imaging and additional notable ED work-up: Chest x-ray shows no evidence of acute cardiopulmonary process, CT head showed no evidence of acute  intracranial process, including no evidence of intracranial hemorrhage.  CT cervical spine showed no evidence of acute cervical spine fracture or subluxation injury, also showed some ill-defined lytic and sclerotic areas scattered throughout the osseous structures.  CT lumbar spine showed severe left L5 neuroforaminal stenosis, showing no evidence of spinal canal stenosis, and also showing evidence of bilateral pleural effusions, right greater than left.  While in the ED, the following were administered: Albumin 25%, 25 g IV x 1 dose, Lasix 40 mg IV x 1 dose, calcium gluconate 1 g IV x 1 dose, Rocephin, Norco 5/325 mg p.o. x 1.  Subsequently, the patient was admitted for further evaluation and management to suspected acute cystitis in setting of presenting generalized weakness, with suspicion for acute on chronic diastolic heart failure as well as evidence of hypocalcemia.    Review of Systems: As per HPI otherwise 10 point review of systems negative.   Past Medical History:  Diagnosis Date   Acute myelogenous leukemia ([place]) 02/2014   Acute pancreatitis    Arthritis    "back" (09/17/2017)   Atrial flutter (HCC)    Cardiomyopathy (HCC)    CHF (congestive heart failure) (HCC)    Chronic kidney disease, stage II (mild)    Chronic lower back pain    Colon polyps 04/29/2010   TUBULAR ADENOMA AND A SERRATED ADENOMA   COPD (chronic obstructive pulmonary disease) ([place])    CORONARY ARTERY DISEASE 12/24/2007   DIABETES MELLITUS, TYPE II 07/13/2007   History of blood transfusion 2015   "related to leukemia"   History of kidney stones 12/24/2007   History of uterine cancer    HYPERLIPIDEMIA 12/24/2007   HYPERTENSION 07/13/2007   NSTEMI (non-ST elevated myocardial infarction) ([place]) 09/17/2017   Uterine cancer ([place])     Past Surgical History:  Procedure Laterality Date   ABDOMINAL HYSTERECTOMY     "still have my ovaries"   BREAST BIOPSY Left 09/22/2019   BREAST LUMPECTOMY Left 10/24/2019    BREAST LUMPECTOMY WITH RADIOACTIVE SEED LOCALIZATION Left 10/24/2019   Procedure: LEFT BREAST LUMPECTOMY WITH RADIOACTIVE SEED LOCALIZATION;  Surgeon: Jovita Kussmaul, MD;  Location: [place];  Service: General;  Laterality: Left;   CARDIAC CATHETERIZATION  2002   CARDIAC CATHETERIZATION  09/17/2017   CATARACT EXTRACTION W/ INTRAOCULAR LENS  IMPLANT, BILATERAL Bilateral    COLONOSCOPY W/ BIOPSIES AND POLYPECTOMY  2011   CORONARY STENT INTERVENTION N/A 09/21/2017   Procedure: CORONARY STENT INTERVENTION;  Surgeon: Troy Sine, MD;  Location: [facility];  Service: Cardiovascular;  Laterality: N/A;   DILATION AND CURETTAGE OF UTERUS     ESOPHAGOGASTRODUODENOSCOPY N/A 01/30/2022   Procedure: ESOPHAGOGASTRODUODENOSCOPY (EGD);  Surgeon: Milus Banister, MD;  Location: [facility];  Service: Endoscopy;  Laterality: N/A;   EUS N/A 01/30/2022   Procedure: UPPER ENDOSCOPIC ULTRASOUND (EUS) RADIAL;  Surgeon: Milus Banister, MD;  Location: [facility];  Service: Endoscopy;  Laterality: N/A;   EXCISIONAL HEMORRHOIDECTOMY  X 2   FINE NEEDLE ASPIRATION N/A 01/30/2022   Procedure: FINE NEEDLE ASPIRATION (FNA) LINEAR;  Surgeon: Milus Banister, MD;  Location: [facility];  Service: Endoscopy;  Laterality: N/A;   GANGLION CYST EXCISION Left X 3   INTRAMEDULLARY (IM) NAIL INTERTROCHANTERIC Right 08/30/2022   Procedure: INTRAMEDULLARY (IM) NAIL INTERTROCHANTERIC;  Surgeon: Altamese Puryear, MD;  Location: [facility];  Service: Orthopedics;  Laterality: Right;   INTRAVASCULAR PRESSURE WIRE/FFR STUDY N/A 09/17/2017   Procedure: INTRAVASCULAR PRESSURE WIRE/FFR STUDY;  Surgeon: Nelva Bush, MD;  Location: [facility];  Service: Cardiovascular;  Laterality: N/A;   IR THORACENTESIS ASP PLEURAL SPACE W/IMG GUIDE  10/27/2022   NASAL SINUS SURGERY     PORTA CATH INSERTION Right 2013   RIGHT/LEFT HEART CATH AND CORONARY ANGIOGRAPHY N/A 09/17/2017   Procedure: RIGHT/LEFT HEART CATH  AND CORONARY ANGIOGRAPHY;  Surgeon: Nelva Bush, MD;  Location: [facility];  Service: Cardiovascular;  Laterality: N/A;   TEE WITHOUT CARDIOVERSION N/A 11/19/2022   Procedure: TRANSESOPHAGEAL ECHOCARDIOGRAM (TEE);  Surgeon: Hebert Soho, DO;  Location: [facility];  Service: Cardiovascular;  Laterality: N/A;   TUBAL LIGATION      Social History:  reports that she quit smoking about 22 years ago. Her smoking use included cigarettes. She has a 20.00 pack-year smoking history. She has never used smokeless tobacco. She reports that she does not drink alcohol and does not use drugs.   No Known Allergies  Family History  Problem Relation Age of Onset   COPD Father    Esophageal cancer Father    Breast cancer Sister 57   Lung cancer Sister    Esophageal cancer Brother    Suicidality Brother    Lung cancer Sister    Lung cancer Sister    Cervical cancer Sister    Congestive Heart Failure Mother    Heart attack Maternal Grandmother    Skin cancer Maternal Grandfather    Heart attack Maternal Grandfather    Stroke Paternal Grandmother 17   Heart attack Paternal Grandfather    Cancer Maternal Uncle        unknown type, dx. >65   Cancer Paternal Aunt        unknown type, dx. >50   Cancer Paternal Uncle        unknown type, dx. >50   Cirrhosis Maternal Uncle    Skin cancer Daughter    Leukemia Cousin        maternal first cousin; dx. >50, in remission   Cancer Niece        unknown type behind her ear, dx. 30s/40s   Colon cancer Neg Hx     Family history reviewed and not pertinent    Prior to Admission medications   Medication Sig Start Date End Date Taking? Authorizing Provider  apixaban (ELIQUIS) 2.5 MG TABS tablet Take 1 tablet (2.5 mg total) by mouth 2 (two) times daily. 09/30/22   Angiulli, Lavon Paganini, PA-C  ascorbic acid (VITAMIN C) 1000 MG tablet Take 1 tablet (1,000 mg total) by mouth daily. 09/30/22   Angiulli, Lavon Paganini, PA-C  atorvastatin (LIPITOR) 40  MG tablet Take 1 tablet (40 mg total) by mouth daily. 09/30/22   Angiulli, Lavon Paganini, PA-C  clopidogrel (PLAVIX) 75 MG tablet Take 1 tablet (75 mg total) by mouth every evening. 09/30/22   Angiulli, Lavon Paganini, PA-C  cyanocobalamin (VITAMIN B12) 1000 MCG/ML injection INJECT 1ML IN THE MUSCLE EVERY 30 DAYS AS DIRECTED Patient taking differently: Inject 1,000 mcg into the muscle every 30 (thirty) days. 08/28/22   Isaac Bliss, Rayford Halsted, MD  docusate sodium (COLACE) 100 MG capsule Take 1 capsule (100 mg total) by mouth 2 (two) times daily. 09/30/22   Angiulli, Lavon Paganini, PA-C  fluticasone furoate-vilanterol (BREO ELLIPTA) 200-25 MCG/ACT AEPB Inhale 1 puff into the lungs as needed (wheezing SOB). 09/30/22   Angiulli, Lavon Paganini, PA-C  insulin  glargine, 2 Unit Dial, (TOUJEO MAX SOLOSTAR) 300 UNIT/ML Solostar Pen Inject 6 Units into the skin at bedtime. Patient taking differently: Inject 10-16 Units into the skin See admin instructions. Inject 10 units into the skin at supper and 16 units at bedtime 09/05/22   Florencia Reasons, MD  isosorbide mononitrate (IMDUR) 30 MG 24 hr tablet Take 1 tablet (30 mg total) by mouth daily. 09/30/22   Angiulli, Lavon Paganini, PA-C  letrozole [facility]) 2.5 MG tablet Take 1 tablet (2.5 mg total) by mouth daily. 02/14/22   Owens Shark, NP  levETIRAcetam (KEPPRA) 500 MG tablet TAKE 1 TABLET(500 MG) BY MOUTH TWICE DAILY 12/02/22   Raulkar, Clide Deutscher, MD  LORazepam (ATIVAN) 0.5 MG tablet Take 1 tablet (0.5 mg total) by mouth every 6 (six) hours as needed for anxiety. 09/30/22   Angiulli, Lavon Paganini, PA-C  melatonin 3 MG TABS tablet Take 1 tablet (3 mg total) by mouth at bedtime. Patient taking differently: Take 3 mg by mouth at bedtime. As needed 09/30/22   Angiulli, Lavon Paganini, PA-C  methocarbamol (ROBAXIN) 500 MG tablet Take 1 tablet (500 mg total) by mouth every 4 (four) hours as needed for muscle spasms. 09/30/22   Angiulli, Lavon Paganini, PA-C  metoprolol succinate (TOPROL-XL) 25 MG 24 hr tablet  Take 0.5 tablets (12.5 mg total) by mouth daily. Take with or immediately following a meal. 09/30/22   Angiulli, Lavon Paganini, PA-C  Multiple Vitamin (MULTIVITAMIN WITH MINERALS) TABS tablet Take 1 tablet by mouth daily. 09/30/22   Angiulli, Lavon Paganini, PA-C  oxyCODONE (OXY IR/ROXICODONE) 5 MG immediate release tablet Take 1 tablet (5 mg total) by mouth every 4 (four) hours as needed for moderate pain. 09/30/22   Angiulli, Lavon Paganini, PA-C  polyethylene glycol (MIRALAX / GLYCOLAX) 17 g packet Take 17 g by mouth daily. 09/30/22   Angiulli, Lavon Paganini, PA-C  psyllium (HYDROCIL/METAMUCIL) 95 % PACK Take 1 packet by mouth daily. 09/30/22   Cathlyn Parsons, PA-C     Objective    Physical Exam: Vitals:   12/06/22 2105 12/06/22 2113 12/06/22 2357 12/07/22 0138  BP:  (!) 89/56 110/61 (!) 87/44  Pulse:  (!) 50 (!) 57 60  Resp:  _0 Temp:  (!) 97.5 F (36.4 C) (!) 97.5 F (36.4 C)   TempSrc:  Oral Oral   SpO2: 97% 98% 98% 95%    General: appears to be stated age; alert, oriented Skin: warm, dry, no rash Head:  AT/[place] Mouth:  Oral mucosa membranes appear moist, normal dentition Neck: supple; trachea midline Heart:  RRR; did not appreciate any M/R/G Lungs: Diminished bibasilar breath sounds, but did not appreciate any wheezes, rales, or rhonchi Abdomen: + BS; soft, ND, NT Vascular: 2+ pedal pulses b/l; 2+ radial pulses b/l Extremities: 1-2+ edema bilateral lower extremities, no muscle wasting Neuro: strength and sensation intact in upper and lower extremities b/l   Labs on Admission: I have personally reviewed following labs and imaging studies  CBC: Recent Labs  Lab 12/07/22 0019  WBC 5.0  NEUTROABS 3.0  HGB 8.1*  HCT 25.6*  MCV 97.0  PLT 355*   Basic Metabolic Panel: Recent Labs  Lab 12/07/22 0019  NA 143  K 4.6  CL 119*  CO2 14*  GLUCOSE 120*  BUN 73*  CREATININE 3.03*  CALCIUM 5.3*   GFR: CrCl cannot be calculated (Unknown ideal weight.). Liver Function  Tests: Recent Labs  Lab 12/07/22 0019  AST 15  ALT 9  ALKPHOS 87  BILITOT 0.2*  PROT 5.1*  ALBUMIN 2.4*   Recent Labs  Lab 12/07/22 0019  LIPASE 25   No results for input(s): "AMMONIA" in the last 168 hours. Coagulation Profile: No results for input(s): "INR", "PROTIME" in the last 168 hours. Cardiac Enzymes: No results for input(s): "CKTOTAL", "CKMB", "CKMBINDEX", "TROPONINI" in the last 168 hours. BNP (last 3 results) No results for input(s): "PROBNP" in the last 8760 hours. HbA1C: No results for input(s): "HGBA1C" in the last 72 hours. CBG: Recent Labs  Lab 12/06/22 2151  GLUCAP 131*   Lipid Profile: No results for input(s): "CHOL", "HDL", "LDLCALC", "TRIG", "CHOLHDL", "LDLDIRECT" in the last 72 hours. Thyroid Function Tests: No results for input(s): "TSH", "T4TOTAL", "FREET4", "T3FREE", "THYROIDAB" in the last 72 hours. Anemia Panel: No results for input(s): "VITAMINB12", "FOLATE", "FERRITIN", "TIBC", "IRON", "RETICCTPCT" in the last 72 hours. Urine analysis:    Component Value Date/Time   COLORURINE YELLOW 12/07/2022 0019   APPEARANCEUR CLOUDY (A) 12/07/2022 0019   LABSPEC 1.013 12/07/2022 0019   LABSPEC 1.015 08/04/2014 1439   PHURINE 5.0 12/07/2022 0019   GLUCOSEU NEGATIVE 12/07/2022 0019   GLUCOSEU Negative 08/04/2014 1439   HGBUR SMALL (A) 12/07/2022 0019   HGBUR negative 10/10/2010 1607   BILIRUBINUR NEGATIVE 12/07/2022 0019   BILIRUBINUR Negative 08/04/2014 1439   KETONESUR NEGATIVE 12/07/2022 0019   PROTEINUR 30 (A) 12/07/2022 0019   UROBILINOGEN 0.2 08/04/2014 1439   NITRITE NEGATIVE 12/07/2022 0019   LEUKOCYTESUR LARGE (A) 12/07/2022 0019   LEUKOCYTESUR Negative 08/04/2014 1439    Radiological Exams on Admission: CT Lumbar Spine Wo Contrast  Result Date: 12/06/2022 CLINICAL DATA:  Fall EXAM: CT LUMBAR SPINE WITHOUT CONTRAST TECHNIQUE: Multidetector CT imaging of the lumbar spine was performed without intravenous contrast administration.  Multiplanar CT image reconstructions were also generated. RADIATION DOSE REDUCTION: This exam was performed according to the departmental dose-optimization program which includes automated exposure control, adjustment of the mA and/or kV according to patient size and/or use of iterative reconstruction technique. COMPARISON:  Lumbar spine MRI 08/08/2017 CT abdomen pelvis 04/17/2022 FINDINGS: Segmentation: 5 lumbar type vertebrae. Alignment: Normal Vertebrae: Diffusely abnormal bone marrow with more to multiple Schmorl's nodes, greatest at the superior endplate of L2 and L3. no acute fracture. Paraspinal and other soft tissues: Bilateral pleural effusions, right greater than left. Calcific aortic atherosclerosis. Peripherally calcified low-density pancreatic head mass measures 3.2 cm, decreased in size compared to CT abdomen pelvis 04/17/2022. Disc levels: No spinal canal stenosis. Severe left L5 neural foraminal stenosis. IMPRESSION: 1. Diffusely abnormal bone marrow, unchanged. This may be a sequela of leukemia treatment. 2. No acute fracture. 3. No spinal canal stenosis. Severe left L5 neural foraminal stenosis. 4. Bilateral pleural effusions, right greater than left. 5. Peripherally calcified low-density pancreatic head lesion measures 3.2 cm, decreased in size compared to CT abdomen pelvis 04/17/2022. Aortic Atherosclerosis (ICD10-I70.0). Electronically Signed   By: Ulyses Jarred M.D.   On: 12/06/2022 23:11   CT Head Wo Contrast  Result Date: 12/06/2022 CLINICAL DATA:  Status post fall. EXAM: CT HEAD WITHOUT CONTRAST CT CERVICAL SPINE WITHOUT CONTRAST TECHNIQUE: Multidetector CT imaging of the head and cervical spine was performed following the standard protocol without intravenous contrast. Multiplanar CT image reconstructions of the cervical spine were also generated. RADIATION DOSE REDUCTION: This exam was performed according to the departmental dose-optimization program which includes automated exposure  control, adjustment of the mA and/or kV according to patient size and/or use of iterative reconstruction technique. COMPARISON:  August 29, 2022 FINDINGS: CT HEAD FINDINGS Brain: There is mild cerebral atrophy with widening of the extra-axial spaces and ventricular dilatation. There are areas of decreased attenuation within the white matter tracts of the supratentorial brain, consistent with microvascular disease changes. A stable 1.6 cm x 1.5 cm x 1.6 cm focal calcification is seen along the anterior aspect of the interhemispheric fissure. Vascular: No hyperdense vessels are identified. Skull: Normal. Negative for fracture or focal lesion. Sinuses/Orbits: No acute finding. Other: None. CT CERVICAL SPINE FINDINGS Alignment: Normal. Skull base and vertebrae: No acute fracture. Ill-defined lytic and sclerotic areas are seen scattered throughout the visualized osseous structures. Soft tissues and spinal canal: No prevertebral fluid or swelling. No visible canal hematoma. Disc levels: Mild anterior osteophyte formation is seen at the levels of C3-C4, C4-C5, C5-C6 and C6-C7. There is moderate severity narrowing of the anterior atlantoaxial articulation. Mild multilevel intervertebral disc space narrowing is seen. Bilateral moderate severity multilevel facet joint hypertrophy is noted. Upper chest: Posterior right apical airspace disease is noted with an adjacent pleural effusion. Other: N/A IMPRESSION: 1. No acute intracranial abnormality. 2. Findings suggestive of a stable calcified meningioma along the anterior aspect of the interhemispheric fissure. 3. No acute fracture or subluxation of the cervical spine. 4. Ill-defined lytic and sclerotic areas scattered throughout the visualized osseous structures, concerning for osseous metastatic disease. 5. Posterior right apical airspace disease with an adjacent pleural effusion. Electronically Signed   By: Virgina Norfolk M.D.   On: 12/06/2022 22:31   CT Cervical Spine  Wo Contrast  Result Date: 12/06/2022 CLINICAL DATA:  Status post fall. EXAM: CT HEAD WITHOUT CONTRAST CT CERVICAL SPINE WITHOUT CONTRAST TECHNIQUE: Multidetector CT imaging of the head and cervical spine was performed following the standard protocol without intravenous contrast. Multiplanar CT image reconstructions of the cervical spine were also generated. RADIATION DOSE REDUCTION: This exam was performed according to the departmental dose-optimization program which includes automated exposure control, adjustment of the mA and/or kV according to patient size and/or use of iterative reconstruction technique. COMPARISON:  August 29, 2022 FINDINGS: CT HEAD FINDINGS Brain: There is mild cerebral atrophy with widening of the extra-axial spaces and ventricular dilatation. There are areas of decreased attenuation within the white matter tracts of the supratentorial brain, consistent with microvascular disease changes. A stable 1.6 cm x 1.5 cm x 1.6 cm focal calcification is seen along the anterior aspect of the interhemispheric fissure. Vascular: No hyperdense vessels are identified. Skull: Normal. Negative for fracture or focal lesion. Sinuses/Orbits: No acute finding. Other: None. CT CERVICAL SPINE FINDINGS Alignment: Normal. Skull base and vertebrae: No acute fracture. Ill-defined lytic and sclerotic areas are seen scattered throughout the visualized osseous structures. Soft tissues and spinal canal: No prevertebral fluid or swelling. No visible canal hematoma. Disc levels: Mild anterior osteophyte formation is seen at the levels of C3-C4, C4-C5, C5-C6 and C6-C7. There is moderate severity narrowing of the anterior atlantoaxial articulation. Mild multilevel intervertebral disc space narrowing is seen. Bilateral moderate severity multilevel facet joint hypertrophy is noted. Upper chest: Posterior right apical airspace disease is noted with an adjacent pleural effusion. Other: N/A IMPRESSION: 1. No acute  intracranial abnormality. 2. Findings suggestive of a stable calcified meningioma along the anterior aspect of the interhemispheric fissure. 3. No acute fracture or subluxation of the cervical spine. 4. Ill-defined lytic and sclerotic areas scattered throughout the visualized osseous structures, concerning for osseous metastatic disease. 5. Posterior right apical airspace disease with an adjacent pleural effusion. Electronically Signed  By: Virgina Norfolk M.D.   On: 12/06/2022 22:31   DG Pelvis Portable  Result Date: 12/06/2022 CLINICAL DATA:  Trauma, fall EXAM: PORTABLE PELVIS 1-2 VIEWS COMPARISON:  None Available. FINDINGS: There is previous internal fixation right femur with intramedullary rod. No definite recent displaced fracture is seen. There is joint space narrowing in both hips. Arterial calcifications are seen in soft tissues. Degenerative changes are noted in the visualized lower lumbar spine. IMPRESSION: No recent displaced fracture is seen. Previous internal fixation in right femur. Lumbar spondylosis.  Arteriosclerosis. Electronically Signed   By: Elmer Picker M.D.   On: 12/06/2022 21:58   DG Chest Portable 1 View  Result Date: 12/06/2022 CLINICAL DATA:  Recent fall with weakness, initial encounter EXAM: PORTABLE CHEST 1 VIEW COMPARISON:  11/06/2022 FINDINGS: Cardiac shadow is enlarged but stable. Aortic calcifications are again seen. Chest wall port is again noted on the right and stable. The lungs are well aerated bilaterally. No focal infiltrate is seen. Chronic scarring is noted in the left medial lung base. No acute bony abnormality is noted. IMPRESSION: No acute abnormality seen. Electronically Signed   By: Inez Catalina M.D.   On: 12/06/2022 21:57      Assessment/Plan   Principal Problem:   Acute cystitis Active Problems:   Paroxysmal atrial fibrillation (HCC)   Hyperlipidemia   Anemia of chronic disease   CKD (chronic kidney disease) stage 4, GFR 15-29 ml/min  (HCC)   DM2 (diabetes mellitus, type 2) (HCC)   Acute on chronic diastolic CHF (congestive heart failure) (HCC)   Generalized weakness   Hypocalcemia   History of seizures     #) Acute cystitis: Diagnosis will the basis of 1 week of progressive generalized weakness with presenting urinalysis demonstrating cloudy appearing specimen, significant pyuria, many bacteria, large leukocyte Estrace, and no evidence of squamous epithelial cells to suggest a contaminated specimen.  In the absence of objective fever or leukocytosis, SIRS criteria not met for sepsis at this time.  Urine culture was collected followed by initiation of Rocephin, which will be continued for empiric coverage of suspected acute cystitis.  No additional sources of underlying infection identified at this time, will add on procalcitonin level to further assess.  As noted above, COVID, influenza, RSV PCR were all negative.  Plan: Follow-up for results to urine culture sensitivities.  Continue Rocephin.  Repeat CBC with differential in the morning.           #) Generalized weakness: 1 week of progressive generalized weakness in the absence of any acute focal weakness.  Suspect that this is multifactorial in nature, with suspected contributions from the acute infection in the form of aforementioned acute cystitis.  Will add on procalcitonin level to further assess for any additional underlying infectious process.  Additionally, patient's hemoglobin is noted to be on the low end of her baseline range, likely with a hemodilutional component of the context of suspected acute on chronic diastolic heart failure.  However, will also add on iron studies and further trend hemoglobin as outlined below.   Plan: Further evaluation management of acute cystitis, as above.  Add on procalcitonin level.  CBC with differential as well as CMP in the morning.  Add on serum magnesium level.  Check TSH.  For questions ordered.  Physical  therapy/Occupational Therapy consults ordered for the morning.  Add on iron studies.            #) Acute on chronic diastolic heart failure: dx of acute decompensation  suspected on the basis of presenting worsening of peripheral edema, interval elevation in BNP, along with imaging demonstrating bilateral pleural effusions, as further detailed above. This is in the context of a known history of chronic diastolic heart failure, with most recent echocardiogram performed in November 2023, with results as further detailed above. Etiology leading to presenting acutely decompensated heart failure is currently unclear.  It does not appear that the patient is on any diuretic medications at home. Overall, ACS leading to presenting acutely decompensated heart failure appears less likely at this time in the absence of any recent CP, presenting EKG showing no evidence of acute ischemic changes, as well as non-elevated troponin.   Of note, patient was ordered Lasix 40 mg IV x 1 in the ED. this was initially held due to borderline hypotension, which is shown some improvement following interval albumin, as above.  Lasix has been rescheduled, with dose reduction to 20 mg IV x 1/L this morning, with close monitoring of interval and ensuing blood pressure trend.  In the setting of concern for acute on chronic diastolic heart failure along with presenting heart rates in the 50s - 60s, will hold home metoprolol succinate for now, as well as Imdur.  Overall, delicate fluid balance given her concomitant history of CKD stage IV.   Plan: monitor strict I's & O's and daily weights. Monitor on telemetry, Monitor continuous pulse oximetry.  Lasix 20 mg IV x 1, as above.  Repeat CMP in the morning, including for monitoring trend of potassium, bicarbonate, and renal function in response to interval diuresis efforts. Check serum magnesium level. Close monitoring of ensuing blood pressure response to diuresis efforts, home  Imdur, metoprolol succinate.  Trend troponin.            #) Hypocalcemia: Corrected presenting serum calcium level 6.5, status post 1 g calcium gluconate in the ED this evening.  Will likely require additional calcium supplementation, particular given plan for loop diuretic, as above.  Plan: CMP in the morning.  Check serum magnesium and phosphorus levels.              #) Paroxysmal atrial fibrillation: Documented history of such. In setting of CHA2DS2-VASc score of  9, there is an indication for chronic anticoagulation for thromboembolic prophylaxis. Consistent with this, patient is chronically anticoagulated on Eliquis. Home AV nodal blocking regimen: Metoprolol succinate.  Most recent echocardiogram was performed in November 2023, with results as further detailed above. Presenting EKG demonstrates rate controlled atrial fibrillation, without overt evidence of acute ischemic changes.  Overall, heart rate does appear to be in the 50s to 60s, with borderline soft blood pressures.  Consequently, we will hold next dose of metoprolol succinate.   Plan: monitor strict I's & O's and daily weights. CMP/CBC in AM. Check serum mag level. Continue home Eliquis, will holding next dose of metoprolol succinate, as above.  Monitor on telemetry.  Add on serum magnesium level.                #) Type 2 Diabetes Mellitus: documented history of such. Home insulin regimen: Glargine 10 units subcu every morning as well as glargine 16 units subcu nightly.  Does not appear to be any short acting insulin as an outpatient.  Home oral hypoglycemic agents: None. presenting blood sugar: 120. Most recent A1c noted to be 9.9% on 08/13/2022. in terms of initial dose of basal insulin to be started during this hospitalization, will resume approximately half of outpatient dose in order to  reduce risk for ensuing hypoglycemia   Plan: accuchecks QAC and HS with low dose SSI.  Insulin glargine 5 units  subcu twice daily.               #) Hyperlipidemia: documented h/o such. On high intensity atorvastatin as outpatient.   Plan: continue home statin.               #) History of seizures: Documented history of such, without clinical evidence to suggest active seizures at this time.  Outpatient antiepileptic regimen consists of: Keppra.    Plan: Continue outpatient antiepileptic regimen.                #) CKD Stage 4: Documented history of such, with baseline creatinine 1.8-3.0, with presenting creatinine consistent with this baseline and improving relative to most recent prior serum creatinine data point from the end of November 2023, as further quantified above.   Plan: Monitor strict I's and O's and daily weights.  Attempt to avoid nephrotoxic agents.  CMP/magnesium level in the AM.             #) Anemia of chronic disease: Documented history of such, a/w with baseline hgb range 8-10, with presenting hgb consistent with this range, in the absence of any overt evidence of active bleed.     Plan: Repeat CBC in the morning.  Add on iron studies.  Type and screen.  Also ordered a repeat H&H to be checked at 10 AM in the context of borderline initial soft blood pressures while on chronic anticoagulation in the setting of ground-level mechanical presenting fall.       DVT prophylaxis: SCD's + home Eliquis Code Status: DNR/DNI, consistent with continuous documentation from most recent hospitalization confirmed via patient Family Communication: none Disposition Plan: Per Rounding Team Consults called: none;  Admission status: Inpatient     I SPENT GREATER THAN 75  MINUTES IN CLINICAL CARE TIME/MEDICAL DECISION-MAKING IN COMPLETING THIS ADMISSION.      [place] DO [facility]  From [place]   12/07/2022, 3:02 AM

## 2022-12-07 NOTE — ED Notes (Signed)
Paging Dr Nevada Crane Re ca level

## 2022-12-07 NOTE — Progress Notes (Signed)
PROGRESS NOTE    [name]  ALP:[id number] DOB: 02-21-1941 DOA: 12/06/2022 PCP: Isaac Bliss, Rayford Halsted, MD   Brief Narrative: [name] is a 81 y.o. female with a history of AML, breast cancer, paroxysmal atrial fibrillation on Eliquis, chronic diastolic heart failure, diabetes mellitus type 2, CKD stage IV. Patient presented secondary to generalized weakness and found to have evidence of UTI, hypocalcemia and acute heart failure. Patient started on calcium replacement, empiric Ceftriaxone and given Lasix.   Assessment and Plan:  Acute cystitis Presumed secondary to weakness and abnormal urinalysis. Urine culture obtained and empiric Ceftriaxone started. -Continue Ceftriaxone -Follow-up urine culture  Generalized weakness Noted on admission. Possibly related to acute illness however, possibly related to hypocalcemia. -PT/OT  Acute on chronic diastolic heart failure Present on admission per history. BNP of 849.4. Patient given Lasix 20 mg IV on admission. Currently on room air  Hypocalcemia Patient with associated hyperphosphatemia. Likely related to renal impairment. Patient has received multiple doses of calcium gluconate -Renal diet -Repeat BMP; continue calcium supplementation if not repleted  AKI on CKD stage IV Baseline creatinine of about 2 with more recent creatinine of 4.33. Creatinine of 3.03 on admission. Creatinine stable. Patient appears overloaded, although she was hypotensive on admission. -Repeat BMP  Metabolic acidosis Secondary to CKD -Start sodium bicarb  Hyperlipidemia -Continue Lipitor  Diabetes mellitus, type 2 -Continue Semglee 5 units BID and SSI  Paroxysmal atrial fibrillation -Continue Eliquis  History of seizures -Continue Keppra  Anemia of chronic disease Baseline hemoglobin of about 8-9. Hemoglobin of 8.1 on admission with drift down to 7.2. No evidence of hemorrhage. -Repeat CBC in AM  AML Noted. Patient follows with  Dr. [name] as an outpatient.  CAD Patient with a history of NSTEMI, treated medically. Patient is on Plavix, Lipitor, Imdur and metoprolol succinate. Antihypertensives are held on admission secondary to hypotension. -Continue Plavix   DVT prophylaxis: Eliquis Code Status:   Code Status: DNR Family Communication: None at bedside Disposition Plan: Discharge home likely in 2-3 days pending transition to oral antibiotics/culture data and improvement/stability of hypocalcemia   Consultants:  None  Procedures:  None  Antimicrobials: Ceftriaxone    Subjective: Patient reports back pain which is chronic. She also reports some shoulder pain. No other issues.  Objective: BP 102/65   Pulse 81   Temp (!) 97.5 F (36.4 C) (Oral)   Resp (!) 28   SpO2 99%   Examination:  General exam: Appears calm and comfortable Respiratory system: Clear to auscultation. Respiratory effort normal. Cardiovascular system: S1 & S2 heard. Gastrointestinal system: Abdomen is nondistended, soft and nontender. Normal bowel sounds heard. Central nervous system: Alert and oriented. Musculoskeletal: BLE edema No calf tenderness Psychiatry: Judgement and insight appear normal. Mood & affect appropriate.    Data Reviewed: I have personally reviewed following labs and imaging studies  CBC Lab Results  Component Value Date   WBC 3.9 (L) 12/07/2022   RBC 2.41 (L) 12/07/2022   HGB 7.2 (L) 12/07/2022   HCT 23.9 (L) 12/07/2022   MCV 99.2 12/07/2022   MCH 29.9 12/07/2022   PLT 124 (L) 12/07/2022   MCHC 30.1 12/07/2022   RDW 13.8 12/07/2022   LYMPHSABS 1.0 12/07/2022   MONOABS 0.3 12/07/2022   EOSABS 0.1 12/07/2022   BASOSABS 0.0 35/32/9924     Last metabolic panel Lab Results  Component Value Date   NA 139 12/07/2022   K 4.0 12/07/2022   CL 117 (H) 12/07/2022   CO2  12 (L) 12/07/2022   BUN 75 (H) 12/07/2022   CREATININE 2.95 (H) 12/07/2022   GLUCOSE 98 12/07/2022   GFRNONAA 16 (L)  12/07/2022   GFRAA 39 (L) 10/04/2020   CALCIUM 5.1 (LL) 12/07/2022   PHOS 8.4 (H) 12/07/2022   PROT 5.0 (L) 12/07/2022   ALBUMIN 2.7 (L) 12/07/2022   BILITOT 0.6 12/07/2022   ALKPHOS 71 12/07/2022   AST 9 (L) 12/07/2022   ALT 7 12/07/2022   ANIONGAP 10 12/07/2022    GFR: CrCl cannot be calculated (Unknown ideal weight.).  Recent Results (from the past 240 hour(s))  Resp panel by RT-PCR (RSV, Flu A&B, Covid) Anterior Nasal Swab     Status: None   Collection Time: 12/06/22 11:45 PM   Specimen: Anterior Nasal Swab  Result Value Ref Range Status   SARS Coronavirus 2 by RT PCR NEGATIVE NEGATIVE Final    Comment: (NOTE) SARS-CoV-2 target nucleic acids are NOT DETECTED.  The SARS-CoV-2 RNA is generally detectable in upper respiratory specimens during the acute phase of infection. The lowest concentration of SARS-CoV-2 viral copies this assay can detect is 138 copies/mL. A negative result does not preclude SARS-Cov-2 infection and should not be used as the sole basis for treatment or other patient management decisions. A negative result may occur with  improper specimen collection/handling, submission of specimen other than nasopharyngeal swab, presence of viral mutation(s) within the areas targeted by this assay, and inadequate number of viral copies(<138 copies/mL). A negative result must be combined with clinical observations, patient history, and epidemiological information. The expected result is Negative.  Fact Sheet for Patients:  EntrepreneurPulse.com.au  Fact Sheet for Healthcare Providers:  IncredibleEmployment.be  This test is no t yet approved or cleared by the [place] FDA and  has been authorized for detection and/or diagnosis of SARS-CoV-2 by FDA under an Emergency Use Authorization (EUA). This EUA will remain  in effect (meaning this test can be used) for the duration of the COVID-19 declaration under Section 564(b)(1) of  the Act, 21 U.S.C.section 360bbb-3(b)(1), unless the authorization is terminated  or revoked sooner.       Influenza A by PCR NEGATIVE NEGATIVE Final   Influenza B by PCR NEGATIVE NEGATIVE Final    Comment: (NOTE) The Xpert Xpress SARS-CoV-2/FLU/RSV plus assay is intended as an aid in the diagnosis of influenza from Nasopharyngeal swab specimens and should not be used as a sole basis for treatment. Nasal washings and aspirates are unacceptable for Xpert Xpress SARS-CoV-2/FLU/RSV testing.  Fact Sheet for Patients: EntrepreneurPulse.com.au  Fact Sheet for Healthcare Providers: IncredibleEmployment.be  This test is not yet approved or cleared by the [place] FDA and has been authorized for detection and/or diagnosis of SARS-CoV-2 by FDA under an Emergency Use Authorization (EUA). This EUA will remain in effect (meaning this test can be used) for the duration of the COVID-19 declaration under Section 564(b)(1) of the Act, 21 U.S.C. section 360bbb-3(b)(1), unless the authorization is terminated or revoked.     Resp Syncytial Virus by PCR NEGATIVE NEGATIVE Final    Comment: (NOTE) Fact Sheet for Patients: EntrepreneurPulse.com.au  Fact Sheet for Healthcare Providers: IncredibleEmployment.be  This test is not yet approved or cleared by the [place] FDA and has been authorized for detection and/or diagnosis of SARS-CoV-2 by FDA under an Emergency Use Authorization (EUA). This EUA will remain in effect (meaning this test can be used) for the duration of the COVID-19 declaration under Section 564(b)(1) of the Act, 21 U.S.C. section 360bbb-3(b)(1), unless  the authorization is terminated or revoked.  Performed at [facility], [place] [address] [place], [place] [address]       Radiology Studies: CT Lumbar Spine Wo Contrast  Result Date: 12/06/2022 CLINICAL DATA:  Fall EXAM: CT LUMBAR SPINE  WITHOUT CONTRAST TECHNIQUE: Multidetector CT imaging of the lumbar spine was performed without intravenous contrast administration. Multiplanar CT image reconstructions were also generated. RADIATION DOSE REDUCTION: This exam was performed according to the departmental dose-optimization program which includes automated exposure control, adjustment of the mA and/or kV according to patient size and/or use of iterative reconstruction technique. COMPARISON:  Lumbar spine MRI 08/08/2017 CT abdomen pelvis 04/17/2022 FINDINGS: Segmentation: 5 lumbar type vertebrae. Alignment: Normal Vertebrae: Diffusely abnormal bone marrow with more to multiple Schmorl's nodes, greatest at the superior endplate of L2 and L3. no acute fracture. Paraspinal and other soft tissues: Bilateral pleural effusions, right greater than left. Calcific aortic atherosclerosis. Peripherally calcified low-density pancreatic head mass measures 3.2 cm, decreased in size compared to CT abdomen pelvis 04/17/2022. Disc levels: No spinal canal stenosis. Severe left L5 neural foraminal stenosis. IMPRESSION: 1. Diffusely abnormal bone marrow, unchanged. This may be a sequela of leukemia treatment. 2. No acute fracture. 3. No spinal canal stenosis. Severe left L5 neural foraminal stenosis. 4. Bilateral pleural effusions, right greater than left. 5. Peripherally calcified low-density pancreatic head lesion measures 3.2 cm, decreased in size compared to CT abdomen pelvis 04/17/2022. Aortic Atherosclerosis (ICD10-I70.0). Electronically Signed   By: Ulyses Jarred M.D.   On: 12/06/2022 23:11   CT Head Wo Contrast  Result Date: 12/06/2022 CLINICAL DATA:  Status post fall. EXAM: CT HEAD WITHOUT CONTRAST CT CERVICAL SPINE WITHOUT CONTRAST TECHNIQUE: Multidetector CT imaging of the head and cervical spine was performed following the standard protocol without intravenous contrast. Multiplanar CT image reconstructions of the cervical spine were also generated.  RADIATION DOSE REDUCTION: This exam was performed according to the departmental dose-optimization program which includes automated exposure control, adjustment of the mA and/or kV according to patient size and/or use of iterative reconstruction technique. COMPARISON:  August 29, 2022 FINDINGS: CT HEAD FINDINGS Brain: There is mild cerebral atrophy with widening of the extra-axial spaces and ventricular dilatation. There are areas of decreased attenuation within the white matter tracts of the supratentorial brain, consistent with microvascular disease changes. A stable 1.6 cm x 1.5 cm x 1.6 cm focal calcification is seen along the anterior aspect of the interhemispheric fissure. Vascular: No hyperdense vessels are identified. Skull: Normal. Negative for fracture or focal lesion. Sinuses/Orbits: No acute finding. Other: None. CT CERVICAL SPINE FINDINGS Alignment: Normal. Skull base and vertebrae: No acute fracture. Ill-defined lytic and sclerotic areas are seen scattered throughout the visualized osseous structures. Soft tissues and spinal canal: No prevertebral fluid or swelling. No visible canal hematoma. Disc levels: Mild anterior osteophyte formation is seen at the levels of C3-C4, C4-C5, C5-C6 and C6-C7. There is moderate severity narrowing of the anterior atlantoaxial articulation. Mild multilevel intervertebral disc space narrowing is seen. Bilateral moderate severity multilevel facet joint hypertrophy is noted. Upper chest: Posterior right apical airspace disease is noted with an adjacent pleural effusion. Other: N/A IMPRESSION: 1. No acute intracranial abnormality. 2. Findings suggestive of a stable calcified meningioma along the anterior aspect of the interhemispheric fissure. 3. No acute fracture or subluxation of the cervical spine. 4. Ill-defined lytic and sclerotic areas scattered throughout the visualized osseous structures, concerning for osseous metastatic disease. 5. Posterior right apical  airspace disease with an adjacent pleural  effusion. Electronically Signed   By: Virgina Norfolk M.D.   On: 12/06/2022 22:31   CT Cervical Spine Wo Contrast  Result Date: 12/06/2022 CLINICAL DATA:  Status post fall. EXAM: CT HEAD WITHOUT CONTRAST CT CERVICAL SPINE WITHOUT CONTRAST TECHNIQUE: Multidetector CT imaging of the head and cervical spine was performed following the standard protocol without intravenous contrast. Multiplanar CT image reconstructions of the cervical spine were also generated. RADIATION DOSE REDUCTION: This exam was performed according to the departmental dose-optimization program which includes automated exposure control, adjustment of the mA and/or kV according to patient size and/or use of iterative reconstruction technique. COMPARISON:  August 29, 2022 FINDINGS: CT HEAD FINDINGS Brain: There is mild cerebral atrophy with widening of the extra-axial spaces and ventricular dilatation. There are areas of decreased attenuation within the white matter tracts of the supratentorial brain, consistent with microvascular disease changes. A stable 1.6 cm x 1.5 cm x 1.6 cm focal calcification is seen along the anterior aspect of the interhemispheric fissure. Vascular: No hyperdense vessels are identified. Skull: Normal. Negative for fracture or focal lesion. Sinuses/Orbits: No acute finding. Other: None. CT CERVICAL SPINE FINDINGS Alignment: Normal. Skull base and vertebrae: No acute fracture. Ill-defined lytic and sclerotic areas are seen scattered throughout the visualized osseous structures. Soft tissues and spinal canal: No prevertebral fluid or swelling. No visible canal hematoma. Disc levels: Mild anterior osteophyte formation is seen at the levels of C3-C4, C4-C5, C5-C6 and C6-C7. There is moderate severity narrowing of the anterior atlantoaxial articulation. Mild multilevel intervertebral disc space narrowing is seen. Bilateral moderate severity multilevel facet joint hypertrophy is  noted. Upper chest: Posterior right apical airspace disease is noted with an adjacent pleural effusion. Other: N/A IMPRESSION: 1. No acute intracranial abnormality. 2. Findings suggestive of a stable calcified meningioma along the anterior aspect of the interhemispheric fissure. 3. No acute fracture or subluxation of the cervical spine. 4. Ill-defined lytic and sclerotic areas scattered throughout the visualized osseous structures, concerning for osseous metastatic disease. 5. Posterior right apical airspace disease with an adjacent pleural effusion. Electronically Signed   By: Virgina Norfolk M.D.   On: 12/06/2022 22:31   DG Pelvis Portable  Result Date: 12/06/2022 CLINICAL DATA:  Trauma, fall EXAM: PORTABLE PELVIS 1-2 VIEWS COMPARISON:  None Available. FINDINGS: There is previous internal fixation right femur with intramedullary rod. No definite recent displaced fracture is seen. There is joint space narrowing in both hips. Arterial calcifications are seen in soft tissues. Degenerative changes are noted in the visualized lower lumbar spine. IMPRESSION: No recent displaced fracture is seen. Previous internal fixation in right femur. Lumbar spondylosis.  Arteriosclerosis. Electronically Signed   By: Elmer Picker M.D.   On: 12/06/2022 21:58   DG Chest Portable 1 View  Result Date: 12/06/2022 CLINICAL DATA:  Recent fall with weakness, initial encounter EXAM: PORTABLE CHEST 1 VIEW COMPARISON:  11/06/2022 FINDINGS: Cardiac shadow is enlarged but stable. Aortic calcifications are again seen. Chest wall port is again noted on the right and stable. The lungs are well aerated bilaterally. No focal infiltrate is seen. Chronic scarring is noted in the left medial lung base. No acute bony abnormality is noted. IMPRESSION: No acute abnormality seen. Electronically Signed   By: Inez Catalina M.D.   On: 12/06/2022 21:57      LOS: 0 days    Cordelia Poche, MD [facility] 12/07/2022, 8:45 AM   If  7PM-7AM, please contact night-coverage [email or web address]

## 2022-12-07 NOTE — Care Management Important Message (Signed)
Important Message  Patient Details  Name: AVYANA PUFFENBARGER MRN: [id number] Date of Birth: 11-Oct-1941   Medicare Important Message Given:  Yes     Rodney Booze, LCSW 12/07/2022, 1:21 PM

## 2022-12-07 NOTE — Progress Notes (Signed)
OT Cancellation Note  Patient Details Name: SLOKA VOLANTE MRN: [id number] DOB: 27-Feb-1941   Cancelled Treatment:    Reason Eval/Treat Not Completed: Pain limiting ability to participate (Pt visibly shaking and crying upon arrival with complaint of 10/10 pain. RN made aware. OT to follow up when pt is able to participate.)  Elliot Cousin 12/07/2022, 2:57 PM

## 2022-12-07 NOTE — Progress Notes (Signed)
[facility] Progress Note:   I was paged regarding low calcium result for this patient. I subsequently ordered Calcium gluconate 1 g IV over 1 hour x 1 dose now, and ordered repeat cmp and serum magnesium levels for the AM.    Babs Bertin, DO Hospitalist

## 2022-12-08 ENCOUNTER — Inpatient Hospital Stay (HOSPITAL_COMMUNITY): Payer: Medicare HMO

## 2022-12-08 DIAGNOSIS — N281 Cyst of kidney, acquired: Secondary | ICD-10-CM | POA: Diagnosis not present

## 2022-12-08 DIAGNOSIS — M7732 Calcaneal spur, left foot: Secondary | ICD-10-CM | POA: Diagnosis not present

## 2022-12-08 DIAGNOSIS — N3000 Acute cystitis without hematuria: Secondary | ICD-10-CM | POA: Diagnosis not present

## 2022-12-08 DIAGNOSIS — D649 Anemia, unspecified: Secondary | ICD-10-CM | POA: Diagnosis not present

## 2022-12-08 DIAGNOSIS — D638 Anemia in other chronic diseases classified elsewhere: Secondary | ICD-10-CM | POA: Diagnosis not present

## 2022-12-08 DIAGNOSIS — I70229 Atherosclerosis of native arteries of extremities with rest pain, unspecified extremity: Secondary | ICD-10-CM | POA: Diagnosis not present

## 2022-12-08 DIAGNOSIS — Z1611 Resistance to penicillins: Secondary | ICD-10-CM | POA: Diagnosis present

## 2022-12-08 DIAGNOSIS — N184 Chronic kidney disease, stage 4 (severe): Secondary | ICD-10-CM | POA: Diagnosis present

## 2022-12-08 DIAGNOSIS — E1165 Type 2 diabetes mellitus with hyperglycemia: Secondary | ICD-10-CM | POA: Diagnosis present

## 2022-12-08 DIAGNOSIS — I130 Hypertensive heart and chronic kidney disease with heart failure and stage 1 through stage 4 chronic kidney disease, or unspecified chronic kidney disease: Secondary | ICD-10-CM | POA: Diagnosis not present

## 2022-12-08 DIAGNOSIS — J90 Pleural effusion, not elsewhere classified: Secondary | ICD-10-CM | POA: Diagnosis not present

## 2022-12-08 DIAGNOSIS — Z66 Do not resuscitate: Secondary | ICD-10-CM | POA: Diagnosis present

## 2022-12-08 DIAGNOSIS — Z794 Long term (current) use of insulin: Secondary | ICD-10-CM | POA: Diagnosis not present

## 2022-12-08 DIAGNOSIS — W1830XA Fall on same level, unspecified, initial encounter: Secondary | ICD-10-CM | POA: Diagnosis present

## 2022-12-08 DIAGNOSIS — C9201 Acute myeloblastic leukemia, in remission: Secondary | ICD-10-CM | POA: Diagnosis present

## 2022-12-08 DIAGNOSIS — E785 Hyperlipidemia, unspecified: Secondary | ICD-10-CM

## 2022-12-08 DIAGNOSIS — E1122 Type 2 diabetes mellitus with diabetic chronic kidney disease: Secondary | ICD-10-CM | POA: Diagnosis present

## 2022-12-08 DIAGNOSIS — Z515 Encounter for palliative care: Secondary | ICD-10-CM | POA: Diagnosis not present

## 2022-12-08 DIAGNOSIS — I70222 Atherosclerosis of native arteries of extremities with rest pain, left leg: Secondary | ICD-10-CM | POA: Diagnosis present

## 2022-12-08 DIAGNOSIS — Z87898 Personal history of other specified conditions: Secondary | ICD-10-CM | POA: Diagnosis not present

## 2022-12-08 DIAGNOSIS — R569 Unspecified convulsions: Secondary | ICD-10-CM | POA: Diagnosis present

## 2022-12-08 DIAGNOSIS — I4891 Unspecified atrial fibrillation: Secondary | ICD-10-CM | POA: Diagnosis not present

## 2022-12-08 DIAGNOSIS — L97522 Non-pressure chronic ulcer of other part of left foot with fat layer exposed: Secondary | ICD-10-CM | POA: Diagnosis not present

## 2022-12-08 DIAGNOSIS — I739 Peripheral vascular disease, unspecified: Secondary | ICD-10-CM | POA: Diagnosis not present

## 2022-12-08 DIAGNOSIS — N390 Urinary tract infection, site not specified: Secondary | ICD-10-CM | POA: Diagnosis not present

## 2022-12-08 DIAGNOSIS — Z1152 Encounter for screening for COVID-19: Secondary | ICD-10-CM | POA: Diagnosis not present

## 2022-12-08 DIAGNOSIS — I96 Gangrene, not elsewhere classified: Secondary | ICD-10-CM | POA: Diagnosis not present

## 2022-12-08 DIAGNOSIS — E11621 Type 2 diabetes mellitus with foot ulcer: Secondary | ICD-10-CM | POA: Diagnosis present

## 2022-12-08 DIAGNOSIS — M79662 Pain in left lower leg: Secondary | ICD-10-CM | POA: Diagnosis not present

## 2022-12-08 DIAGNOSIS — I480 Paroxysmal atrial fibrillation: Secondary | ICD-10-CM | POA: Diagnosis present

## 2022-12-08 DIAGNOSIS — J9811 Atelectasis: Secondary | ICD-10-CM | POA: Diagnosis not present

## 2022-12-08 DIAGNOSIS — J811 Chronic pulmonary edema: Secondary | ICD-10-CM | POA: Diagnosis not present

## 2022-12-08 DIAGNOSIS — L0390 Cellulitis, unspecified: Secondary | ICD-10-CM | POA: Diagnosis not present

## 2022-12-08 DIAGNOSIS — R0602 Shortness of breath: Secondary | ICD-10-CM | POA: Diagnosis not present

## 2022-12-08 DIAGNOSIS — M47816 Spondylosis without myelopathy or radiculopathy, lumbar region: Secondary | ICD-10-CM | POA: Diagnosis not present

## 2022-12-08 DIAGNOSIS — M545 Low back pain, unspecified: Secondary | ICD-10-CM | POA: Diagnosis not present

## 2022-12-08 DIAGNOSIS — M2012 Hallux valgus (acquired), left foot: Secondary | ICD-10-CM | POA: Diagnosis not present

## 2022-12-08 DIAGNOSIS — E872 Acidosis, unspecified: Secondary | ICD-10-CM | POA: Diagnosis present

## 2022-12-08 DIAGNOSIS — Z862 Personal history of diseases of the blood and blood-forming organs and certain disorders involving the immune mechanism: Secondary | ICD-10-CM | POA: Diagnosis not present

## 2022-12-08 DIAGNOSIS — J449 Chronic obstructive pulmonary disease, unspecified: Secondary | ICD-10-CM | POA: Diagnosis present

## 2022-12-08 DIAGNOSIS — I5032 Chronic diastolic (congestive) heart failure: Secondary | ICD-10-CM | POA: Diagnosis not present

## 2022-12-08 DIAGNOSIS — I082 Rheumatic disorders of both aortic and tricuspid valves: Secondary | ICD-10-CM | POA: Diagnosis present

## 2022-12-08 DIAGNOSIS — I5033 Acute on chronic diastolic (congestive) heart failure: Secondary | ICD-10-CM | POA: Diagnosis present

## 2022-12-08 DIAGNOSIS — R0600 Dyspnea, unspecified: Secondary | ICD-10-CM | POA: Diagnosis not present

## 2022-12-08 DIAGNOSIS — M25552 Pain in left hip: Secondary | ICD-10-CM | POA: Diagnosis not present

## 2022-12-08 DIAGNOSIS — M25562 Pain in left knee: Secondary | ICD-10-CM | POA: Diagnosis not present

## 2022-12-08 DIAGNOSIS — L97529 Non-pressure chronic ulcer of other part of left foot with unspecified severity: Secondary | ICD-10-CM | POA: Diagnosis present

## 2022-12-08 DIAGNOSIS — E1151 Type 2 diabetes mellitus with diabetic peripheral angiopathy without gangrene: Secondary | ICD-10-CM | POA: Diagnosis present

## 2022-12-08 DIAGNOSIS — Y929 Unspecified place or not applicable: Secondary | ICD-10-CM | POA: Diagnosis not present

## 2022-12-08 DIAGNOSIS — R531 Weakness: Secondary | ICD-10-CM | POA: Diagnosis not present

## 2022-12-08 DIAGNOSIS — E11649 Type 2 diabetes mellitus with hypoglycemia without coma: Secondary | ICD-10-CM | POA: Diagnosis present

## 2022-12-08 DIAGNOSIS — N2581 Secondary hyperparathyroidism of renal origin: Secondary | ICD-10-CM | POA: Diagnosis present

## 2022-12-08 DIAGNOSIS — I70245 Atherosclerosis of native arteries of left leg with ulceration of other part of foot: Secondary | ICD-10-CM | POA: Diagnosis not present

## 2022-12-08 DIAGNOSIS — Z7189 Other specified counseling: Secondary | ICD-10-CM | POA: Diagnosis not present

## 2022-12-08 DIAGNOSIS — N189 Chronic kidney disease, unspecified: Secondary | ICD-10-CM | POA: Diagnosis not present

## 2022-12-08 DIAGNOSIS — E13621 Other specified diabetes mellitus with foot ulcer: Secondary | ICD-10-CM | POA: Diagnosis not present

## 2022-12-08 DIAGNOSIS — D631 Anemia in chronic kidney disease: Secondary | ICD-10-CM | POA: Diagnosis present

## 2022-12-08 DIAGNOSIS — N179 Acute kidney failure, unspecified: Secondary | ICD-10-CM | POA: Diagnosis not present

## 2022-12-08 LAB — COMPREHENSIVE METABOLIC PANEL
ALT: 8 U/L (ref 0–44)
AST: 12 U/L — ABNORMAL LOW (ref 15–41)
Albumin: 2.9 g/dL — ABNORMAL LOW (ref 3.5–5.0)
Alkaline Phosphatase: 98 U/L (ref 38–126)
Anion gap: 15 (ref 5–15)
BUN: 77 mg/dL — ABNORMAL HIGH (ref 8–23)
CO2: 15 mmol/L — ABNORMAL LOW (ref 22–32)
Calcium: 5.6 mg/dL — CL (ref 8.9–10.3)
Chloride: 112 mmol/L — ABNORMAL HIGH (ref 98–111)
Creatinine, Ser: 3.12 mg/dL — ABNORMAL HIGH (ref 0.44–1.00)
GFR, Estimated: 15 mL/min — ABNORMAL LOW (ref >60)
Glucose, Bld: 151 mg/dL — ABNORMAL HIGH (ref 70–99)
Potassium: 4.4 mmol/L (ref 3.5–5.1)
Sodium: 142 mmol/L (ref 135–145)
Total Bilirubin: 0.5 mg/dL (ref 0.3–1.2)
Total Protein: 5.5 g/dL — ABNORMAL LOW (ref 6.5–8.1)

## 2022-12-08 LAB — RENAL FUNCTION PANEL
Albumin: 2.8 g/dL — ABNORMAL LOW (ref 3.5–5.0)
Albumin: 2.7 g/dL — ABNORMAL LOW (ref 3.5–5.0)
Anion gap: 14 (ref 5–15)
Anion gap: 12 (ref 5–15)
BUN: 76 mg/dL — ABNORMAL HIGH (ref 8–23)
BUN: 74 mg/dL — ABNORMAL HIGH (ref 8–23)
CO2: 17 mmol/L — ABNORMAL LOW (ref 22–32)
CO2: 19 mmol/L — ABNORMAL LOW (ref 22–32)
Calcium: 5.9 mg/dL — CL (ref 8.9–10.3)
Calcium: 5.6 mg/dL — CL (ref 8.9–10.3)
Chloride: 111 mmol/L (ref 98–111)
Chloride: 109 mmol/L (ref 98–111)
Creatinine, Ser: 3 mg/dL — ABNORMAL HIGH (ref 0.44–1.00)
Creatinine, Ser: 3.12 mg/dL — ABNORMAL HIGH (ref 0.44–1.00)
GFR, Estimated: 15 mL/min — ABNORMAL LOW (ref >60)
GFR, Estimated: 15 mL/min — ABNORMAL LOW (ref >60)
Glucose, Bld: 110 mg/dL — ABNORMAL HIGH (ref 70–99)
Glucose, Bld: 82 mg/dL (ref 70–99)
Phosphorus: 8.3 mg/dL — ABNORMAL HIGH (ref 2.5–4.6)
Phosphorus: 7.7 mg/dL — ABNORMAL HIGH (ref 2.5–4.6)
Potassium: 4.5 mmol/L (ref 3.5–5.1)
Potassium: 4.5 mmol/L (ref 3.5–5.1)
Sodium: 142 mmol/L (ref 135–145)
Sodium: 140 mmol/L (ref 135–145)

## 2022-12-08 LAB — VITAMIN D 25 HYDROXY (VIT D DEFICIENCY, FRACTURES): Vit D, 25-Hydroxy: 19.58 ng/mL — ABNORMAL LOW (ref 30–100)

## 2022-12-08 LAB — BLOOD GAS, VENOUS
Acid-base deficit: 15.8 mmol/L — ABNORMAL HIGH (ref 0.0–2.0)
Bicarbonate: 13.7 mmol/L — ABNORMAL LOW (ref 20.0–28.0)
Drawn by: 164
O2 Saturation: 71.7 %
Patient temperature: 37
pCO2, Ven: 45 mmHg (ref 44–60)
pH, Ven: 7.09 — CL (ref 7.25–7.43)
pO2, Ven: 49 mmHg — ABNORMAL HIGH (ref 32–45)

## 2022-12-08 LAB — CALCIUM: Calcium: 5.9 mg/dL — CL (ref 8.9–10.3)

## 2022-12-08 LAB — GLUCOSE, CAPILLARY
Glucose-Capillary: 120 mg/dL — ABNORMAL HIGH (ref 70–99)
Glucose-Capillary: 63 mg/dL — ABNORMAL LOW (ref 70–99)
Glucose-Capillary: 67 mg/dL — ABNORMAL LOW (ref 70–99)
Glucose-Capillary: 66 mg/dL — ABNORMAL LOW (ref 70–99)
Glucose-Capillary: 84 mg/dL (ref 70–99)
Glucose-Capillary: 74 mg/dL (ref 70–99)
Glucose-Capillary: 96 mg/dL (ref 70–99)

## 2022-12-08 LAB — URIC ACID: Uric Acid, Serum: 8.5 mg/dL — ABNORMAL HIGH (ref 2.5–7.1)

## 2022-12-08 LAB — CBC
HCT: 26.4 % — ABNORMAL LOW (ref 36.0–46.0)
Hemoglobin: 8.1 g/dL — ABNORMAL LOW (ref 12.0–15.0)
MCH: 30.2 pg (ref 26.0–34.0)
MCHC: 30.7 g/dL (ref 30.0–36.0)
MCV: 98.5 fL (ref 80.0–100.0)
Platelets: 152 10*3/uL (ref 150–400)
RBC: 2.68 MIL/uL — ABNORMAL LOW (ref 3.87–5.11)
RDW: 14.4 % (ref 11.5–15.5)
WBC: 8.9 10*3/uL (ref 4.0–10.5)
nRBC: 0 % (ref 0.0–0.2)

## 2022-12-08 LAB — MAGNESIUM: Magnesium: 1.4 mg/dL — ABNORMAL LOW (ref 1.7–2.4)

## 2022-12-08 LAB — PHOSPHORUS: Phosphorus: 8.6 mg/dL — ABNORMAL HIGH (ref 2.5–4.6)

## 2022-12-08 MED ORDER — MAGNESIUM SULFATE 2 GM/50ML IV SOLN
2.0000 g | INTRAVENOUS | Status: AC
  Administered 2022-12-08: 2 g via INTRAVENOUS
  Filled 2022-12-08: qty 50

## 2022-12-08 MED ORDER — STERILE WATER FOR INJECTION IV SOLN
INTRAVENOUS | Status: AC
  Filled 2022-12-08 (×2): qty 1000

## 2022-12-08 MED ORDER — OXYCODONE-ACETAMINOPHEN 5-325 MG PO TABS
1.0000 | ORAL_TABLET | ORAL | Status: DC | PRN
  Administered 2022-12-08: 1 via ORAL
  Administered 2022-12-09 (×3): 2 via ORAL
  Administered 2022-12-10: 1 via ORAL
  Administered 2022-12-10 – 2022-12-15 (×20): 2 via ORAL
  Administered 2022-12-15: 1 via ORAL
  Administered 2022-12-15 – 2022-12-17 (×8): 2 via ORAL
  Administered 2022-12-17: 1 via ORAL
  Administered 2022-12-18 – 2022-12-26 (×37): 2 via ORAL
  Filled 2022-12-08 (×28): qty 2
  Filled 2022-12-08: qty 1
  Filled 2022-12-08 (×32): qty 2
  Filled 2022-12-08: qty 1
  Filled 2022-12-08 (×9): qty 2

## 2022-12-08 MED ORDER — SODIUM BICARBONATE 8.4 % IV SOLN
100.0000 meq | INTRAVENOUS | Status: AC
  Administered 2022-12-08: 100 meq via INTRAVENOUS
  Filled 2022-12-08: qty 100

## 2022-12-08 MED ORDER — CALCIUM GLUCONATE-NACL 2-0.675 GM/100ML-% IV SOLN
2.0000 g | INTRAVENOUS | Status: AC
  Administered 2022-12-08: 2000 mg via INTRAVENOUS
  Filled 2022-12-08: qty 100

## 2022-12-08 MED ORDER — CALCIUM GLUCONATE-NACL 2-0.675 GM/100ML-% IV SOLN
2.0000 g | Freq: Two times a day (BID) | INTRAVENOUS | Status: AC
  Administered 2022-12-08 – 2022-12-09 (×2): 2000 mg via INTRAVENOUS
  Filled 2022-12-08 (×2): qty 100

## 2022-12-08 MED ORDER — CALCIUM CARBONATE ANTACID 500 MG PO CHEW
1.0000 | CHEWABLE_TABLET | Freq: Three times a day (TID) | ORAL | Status: DC
  Administered 2022-12-08 (×3): 200 mg via ORAL
  Filled 2022-12-08 (×3): qty 1

## 2022-12-08 NOTE — Progress Notes (Signed)
At 1148, pt's CBG was 67. Gave pt 4 fl oz OJ with 2 packets of sugar added. Recheck CBG at 1218 was 63. BP 100/65. Gave pt another 4 fl oz OJ with 4 packets of sugar added. MD Nettey notified. Recheck BP 122/90 and CBG 66. Another 4 fl oz OJ with 4 added packets of sugar, graham crackers, and peanut butter given to patient. Pt up eating lunch, CBG recheck, result of 84. MD Nettey updated.

## 2022-12-08 NOTE — Progress Notes (Signed)
Mobility Specialist - Progress Note   12/08/22 1112  Mobility  Activity Transferred from chair to bed  Level of Assistance Moderate assist, patient does 50-74%  Assistive Device Front wheel walker  Activity Response Tolerated well  Mobility Referral Yes  $Mobility charge 1 Mobility   Pt was received slide down in chair requesting assistance back to bed. Pt was ModA throughout transfer. Pt was left in bed with all needs met and bed alarm on.   Franki Monte  Mobility Specialist Please contact via Solicitor or Rehab office at [phone]

## 2022-12-08 NOTE — Progress Notes (Addendum)
Provider on call ordered STAT venous blood gas which was drawn/sent and results paged to provider via secure chat. Orders had been received for IV Ca gluconate and IV Mag, as well as subsequent orders for 162mq IV Bicarb STAT and initiation of bicarb gtt. IV MG, Bicarb completed, Ca gluconate infusing at this time and awaiting Bicarb gtt from pharmacy. Pt remains alert/oriented x4 VS stable at this time. Will continue to monitor. HJessie Foot RN

## 2022-12-08 NOTE — Progress Notes (Signed)
Isotonic bicarb drip for moderate to severe metabolic acidosis with pH of 7.09 and serum bicarb of 15.    Presented at bedside the patient is alert, complains of back pain.  O2 saturation 98% on 3 L.  Closely monitor volume status while on IV fluid.  Time: 15 minutes

## 2022-12-08 NOTE — Progress Notes (Signed)
Ca 5.6 on am labs. Was previously 5.6 and pt received Ca gluconate 1gm IV at 1820 prior to arrival to unit for admit. Provider on call Dr Nevada Crane notified via secure chat. Awaiting orders. Will continue to monitor. Jessie Foot, RN

## 2022-12-08 NOTE — Evaluation (Signed)
Physical Therapy Evaluation Patient Details Name: [name] MRN: [id number] DOB: 1941-05-16 Today's Date: 12/08/2022  History of Present Illness  82 y.o. female admitted to [facility] on 12/06/2022 with suspected acute cystitis after presenting from home to [facility] ED complaining of generalized weakness and fall. PMH: AML, breast cancer, paroxysmal atrial fibrillation chronically anticoagulated on Eliquis, chronic diastolic heart failure, type 2 diabetes mellitus, CKD 4 associated baseline creatinine 1.8-3.0, anemia of chronic disease associated baseline hemoglobin 8-10,  Clinical Impression  PTA pt living with her husband in single story home with 1 step to enter. Pt uses RW for household mobility, and reports independence with ADLs, family assists with ADLs. Pt is currently limited in mobility by low back pain, and self described anxiety about onset of pain with mobility, in presence of generalized weakness and decreased balance. PT recommending HHPT at discharge. PT will continue to follow acutely and refer to Mobility Specialist.        Recommendations for follow up therapy are one component of a multi-disciplinary discharge planning process, led by the attending physician.  Recommendations may be updated based on patient status, additional functional criteria and insurance authorization.  Follow Up Recommendations Home health PT      Assistance Recommended at Discharge Intermittent Supervision/Assistance  Patient can return home with the following  A little help with walking and/or transfers;A little help with bathing/dressing/bathroom;Assistance with cooking/housework;Direct supervision/assist for medications management;Direct supervision/assist for financial management;Assist for transportation;Help with stairs or ramp for entrance    Equipment Recommendations None recommended by PT     Functional Status Assessment Patient has had a recent decline in their functional status and  demonstrates the ability to make significant improvements in function in a reasonable and predictable amount of time.     Precautions / Restrictions Precautions Precautions: Fall Restrictions Weight Bearing Restrictions: No      Mobility  Bed Mobility               General bed mobility comments: sitting EoB washing face with OT on entry    Transfers Overall transfer level: Needs assistance Equipment used: Rolling walker (2 wheels) Transfers: Sit to/from Stand Sit to Stand: From elevated surface, Min assist           General transfer comment: pt able to power up from elevated surface, requires min A for steadying in standing    Ambulation/Gait Ambulation/Gait assistance: Min assist Gait Distance (Feet): 12 Feet Assistive device: Rolling walker (2 wheels) Gait Pattern/deviations: Step-through pattern, Decreased step length - right, Decreased step length - left, Shuffle, Trunk flexed Gait velocity: slowed Gait velocity interpretation: <1.31 ft/sec, indicative of household ambulator   General Gait Details: min A for steadying with ambulation around bed to recliner with RW, vc for proximity to RW with navigation around obstacles in room      Balance Overall balance assessment: Needs assistance Sitting-balance support: Feet supported, No upper extremity supported Sitting balance-Leahy Scale: Fair     Standing balance support: Bilateral upper extremity supported, During functional activity, Reliant on assistive device for balance Standing balance-Leahy Scale: Poor                               Pertinent Vitals/Pain Pain Assessment Pain Assessment: Faces Faces Pain Scale: Hurts even more Pain Location: low back Pain Descriptors / Indicators: Discomfort Pain Intervention(s): Limited activity within patient's tolerance, Monitored during session, Repositioned, Premedicated before session    Home  Living Family/patient expects to be discharged to::  Private residence Living Arrangements: Spouse/significant other Available Help at Discharge: Family;Available 24 hours/day Type of Home: House Home Access: Stairs to enter Entrance Stairs-Rails: Can reach both Entrance Stairs-Number of Steps: 1  step   Home Layout: One level Home Equipment: Conservation officer, nature (2 wheels);Tub bench;Wheelchair - manual Additional Comments: does not wear O2 at home    Prior Function               Mobility Comments: uses RW ADLs Comments: reports ind, however previous admission needed assist for LB dressing     Hand Dominance   Dominant Hand: Right    Extremity/Trunk Assessment   Upper Extremity Assessment Upper Extremity Assessment: Defer to OT evaluation    Lower Extremity Assessment Lower Extremity Assessment: Generalized weakness    Cervical / Trunk Assessment Cervical / Trunk Assessment: Kyphotic  Communication   Communication: No difficulties  Cognition Arousal/Alertness: Awake/alert Behavior During Therapy: Flat affect Overall Cognitive Status: Within Functional Limits for tasks assessed                                 General Comments: limited conversation due to pain        General Comments General comments (skin integrity, edema, etc.): SpO2 >90%O2 with 3L O2 via [place]  max HR noted with mobility 103 bpm        Assessment/Plan    PT Assessment Patient needs continued PT services  PT Problem List Decreased strength;Decreased activity tolerance;Decreased mobility;Pain       PT Treatment Interventions DME instruction;Gait training;Stair training;Functional mobility training;Therapeutic activities;Therapeutic exercise;Balance training;Patient/family education    PT Goals (Current goals can be found in the Care Plan section)  Acute Rehab PT Goals Patient Stated Goal: have less pain PT Goal Formulation: With patient Time For Goal Achievement: 12/22/22 Potential to Achieve Goals: Fair    Frequency Min  3X/week        AM-PAC PT "6 Clicks" Mobility  Outcome Measure Help needed turning from your back to your side while in a flat bed without using bedrails?: None Help needed moving from lying on your back to sitting on the side of a flat bed without using bedrails?: None Help needed moving to and from a bed to a chair (including a wheelchair)?: A Little Help needed standing up from a chair using your arms (e.g., wheelchair or bedside chair)?: A Little Help needed to walk in hospital room?: A Little Help needed climbing 3-5 steps with a railing? : A Lot 6 Click Score: 19    End of Session Equipment Utilized During Treatment: Gait belt;Oxygen Activity Tolerance: Patient limited by pain Patient left: in chair;with call bell/phone within reach;with chair alarm set Nurse Communication: Mobility status PT Visit Diagnosis: Unsteadiness on feet (R26.81);Other abnormalities of gait and mobility (R26.89);Muscle weakness (generalized) (M62.81);Difficulty in walking, not elsewhere classified (R26.2);Pain Pain - part of body:  (low back)    Time: 5284-1324 PT Time Calculation (min) (ACUTE ONLY): 20 min   Charges:   PT Evaluation $PT Eval Moderate Complexity: 1 Mod          [name] B. Migdalia Dk PT, DPT Acute Rehabilitation Services Please use secure chat or  Call Office [phone]   [place] 12/08/2022, 11:07 AM

## 2022-12-08 NOTE — Progress Notes (Addendum)
PROGRESS NOTE    [name]  RSW:[id number] DOB: May 03, 1941 DOA: 12/06/2022 PCP: Isaac Bliss, Rayford Halsted, MD   Brief Narrative: [name] is a 82 y.o. female with a history of AML, breast cancer, paroxysmal atrial fibrillation on Eliquis, chronic diastolic heart failure, diabetes mellitus type 2, CKD stage IV. Patient presented secondary to generalized weakness and found to have evidence of UTI, hypocalcemia and acute heart failure. Patient started on calcium replacement, empiric Ceftriaxone and given Lasix. Associated hyperphosphatemia.   Assessment and Plan:  Acute cystitis Presumed secondary to weakness and abnormal urinalysis. Urine culture obtained and empiric Ceftriaxone started. Urine culture significant for Klebsiella pneumoniae. -Continue Ceftriaxone -Follow-up urine culture  Generalized weakness Noted on admission. Possibly related to acute illness however, possibly related to hypocalcemia. PT/OT recommend home health on discharge.  Acute on chronic diastolic heart failure Present on admission per history. BNP of 849.4. Patient given Lasix 20 mg IV on admission. Currently on room air  Hypocalcemia Patient with associated hyperphosphatemia. Likely related to renal impairment. Patient has received multiple doses of calcium gluconate; still with persistent hypocalcemia. Corrected calcium for albumin of 6.5 this morning. -Calcium carbonate; treat hyperphosphatemia  AKI on CKD stage IV Baseline creatinine of about 2 with more recent creatinine of 4.33. Creatinine of 3.03 on admission. Creatinine stable. Patient appears overloaded, although she was hypotensive on admission. -Repeat metabolic panel daily  Hyperphosphatemia Likely secondary to kidney disease. -Calcium carbonate -Renal diet  Hypomagnesemia Given magnesium supplementation -Repeat magnesium in AM  Metabolic acidosis Secondary to CKD. Slight worsening. Started on IV sodium bicarb -Continue sodium  bicarb  Hyperlipidemia -Continue Lipitor  Diabetes mellitus, type 2 Hypoglycemia this morning. -Continue SSI -Discontinue Semglee  Paroxysmal atrial fibrillation -Continue Eliquis  History of seizures -Continue Keppra  Anemia of chronic disease Baseline hemoglobin of about 8-9. Hemoglobin of 8.1 on admission with drift down to 7.2. No evidence of hemorrhage. Repeat H&H significant for hemoglobin of 8.3. -Repeat CBC (Ordered and appears to have been canceled by lab); new order placed  AML Noted. In remission. Patient follows with Dr. [name] as an outpatient.  CAD Patient with a history of NSTEMI, treated medically. Patient is on Plavix, Lipitor, Imdur and metoprolol succinate. Antihypertensives are held on admission secondary to hypotension. -Continue Plavix   DVT prophylaxis: Eliquis Code Status:   Code Status: DNR Family Communication: None at bedside. Patient declined for me to update family Disposition Plan: Discharge home likely in 3-5 days pending transition to oral antibiotics/culture data and improvement/stability of hypocalcemia/hyperphosphatemia   Consultants:  None  Procedures:  None  Antimicrobials: Ceftriaxone    Subjective: Patient reports continued back pain. No other concerns this morning. Still confirms she would prefer for me not to update family and states that they are updated.  Objective: BP 120/76 (BP Location: Right Arm)   Pulse 84   Temp 98.1 F (36.7 C) (Oral)   Resp 20   Ht '5\' 2"'$  (1.575 m)   Wt 70.7 kg   SpO2 97%   BMI 28.51 kg/m   Examination:  General exam: Appears calm and comfortable Respiratory system: Clear to auscultation. Respiratory effort normal. Cardiovascular system: S1 & S2 heard, RRR. No murmurs. Gastrointestinal system: Abdomen is nondistended, soft and nontender. Normal bowel sounds heard. Central nervous system: Alert and oriented. No focal neurological deficits. Musculoskeletal: No calf tenderness Skin:  Left posterior shoulder/back ecchymosis noted Psychiatry: Judgement and insight appear normal. Mood & affect appropriate.    Data Reviewed: I have personally  reviewed following labs and imaging studies  CBC Lab Results  Component Value Date   WBC 3.9 (L) 12/07/2022   RBC 2.41 (L) 12/07/2022   HGB 8.3 (L) 12/07/2022   HCT 26.5 (L) 12/07/2022   MCV 99.2 12/07/2022   MCH 29.9 12/07/2022   PLT 124 (L) 12/07/2022   MCHC 30.1 12/07/2022   RDW 13.8 12/07/2022   LYMPHSABS 1.0 12/07/2022   MONOABS 0.3 12/07/2022   EOSABS 0.1 12/07/2022   BASOSABS 0.0 88/41/6606     Last metabolic panel Lab Results  Component Value Date   NA 142 12/08/2022   K 4.4 12/08/2022   CL 112 (H) 12/08/2022   CO2 15 (L) 12/08/2022   BUN 77 (H) 12/08/2022   CREATININE 3.12 (H) 12/08/2022   GLUCOSE 151 (H) 12/08/2022   GFRNONAA 15 (L) 12/08/2022   GFRAA 39 (L) 10/04/2020   CALCIUM 5.6 (LL) 12/08/2022   PHOS 8.6 (H) 12/08/2022   PROT 5.5 (L) 12/08/2022   ALBUMIN 2.9 (L) 12/08/2022   BILITOT 0.5 12/08/2022   ALKPHOS 98 12/08/2022   AST 12 (L) 12/08/2022   ALT 8 12/08/2022   ANIONGAP 15 12/08/2022    GFR: Estimated Creatinine Clearance: 13.2 mL/min (A) (by C-G formula based on SCr of 3.12 mg/dL (H)).  Recent Results (from the past 240 hour(s))  Resp panel by RT-PCR (RSV, Flu A&B, Covid) Anterior Nasal Swab     Status: None   Collection Time: 12/06/22 11:45 PM   Specimen: Anterior Nasal Swab  Result Value Ref Range Status   SARS Coronavirus 2 by RT PCR NEGATIVE NEGATIVE Final    Comment: (NOTE) SARS-CoV-2 target nucleic acids are NOT DETECTED.  The SARS-CoV-2 RNA is generally detectable in upper respiratory specimens during the acute phase of infection. The lowest concentration of SARS-CoV-2 viral copies this assay can detect is 138 copies/mL. A negative result does not preclude SARS-Cov-2 infection and should not be used as the sole basis for treatment or other patient management decisions. A  negative result may occur with  improper specimen collection/handling, submission of specimen other than nasopharyngeal swab, presence of viral mutation(s) within the areas targeted by this assay, and inadequate number of viral copies(<138 copies/mL). A negative result must be combined with clinical observations, patient history, and epidemiological information. The expected result is Negative.  Fact Sheet for Patients:  EntrepreneurPulse.com.au  Fact Sheet for Healthcare Providers:  IncredibleEmployment.be  This test is no t yet approved or cleared by the [place] FDA and  has been authorized for detection and/or diagnosis of SARS-CoV-2 by FDA under an Emergency Use Authorization (EUA). This EUA will remain  in effect (meaning this test can be used) for the duration of the COVID-19 declaration under Section 564(b)(1) of the Act, 21 U.S.C.section 360bbb-3(b)(1), unless the authorization is terminated  or revoked sooner.       Influenza A by PCR NEGATIVE NEGATIVE Final   Influenza B by PCR NEGATIVE NEGATIVE Final    Comment: (NOTE) The Xpert Xpress SARS-CoV-2/FLU/RSV plus assay is intended as an aid in the diagnosis of influenza from Nasopharyngeal swab specimens and should not be used as a sole basis for treatment. Nasal washings and aspirates are unacceptable for Xpert Xpress SARS-CoV-2/FLU/RSV testing.  Fact Sheet for Patients: EntrepreneurPulse.com.au  Fact Sheet for Healthcare Providers: IncredibleEmployment.be  This test is not yet approved or cleared by the [place] FDA and has been authorized for detection and/or diagnosis of SARS-CoV-2 by FDA under an Emergency Use Authorization (EUA). This EUA  will remain in effect (meaning this test can be used) for the duration of the COVID-19 declaration under Section 564(b)(1) of the Act, 21 U.S.C. section 360bbb-3(b)(1), unless the authorization  is terminated or revoked.     Resp Syncytial Virus by PCR NEGATIVE NEGATIVE Final    Comment: (NOTE) Fact Sheet for Patients: EntrepreneurPulse.com.au  Fact Sheet for Healthcare Providers: IncredibleEmployment.be  This test is not yet approved or cleared by the [place] FDA and has been authorized for detection and/or diagnosis of SARS-CoV-2 by FDA under an Emergency Use Authorization (EUA). This EUA will remain in effect (meaning this test can be used) for the duration of the COVID-19 declaration under Section 564(b)(1) of the Act, 21 U.S.C. section 360bbb-3(b)(1), unless the authorization is terminated or revoked.  Performed at [facility], [place] [address], [place], [place] [address]   Urine Culture     Status: Abnormal (Preliminary result)   Collection Time: 12/07/22 12:19 AM   Specimen: Urine, Clean Catch  Result Value Ref Range Status   Specimen Description URINE, CLEAN CATCH  Final   Special Requests NONE  Final   Culture (A)  Final    >=100,000 COLONIES/mL GRAM NEGATIVE RODS SUSCEPTIBILITIES TO FOLLOW Performed at [facility], [place] [address], [place], [place] [address]    Report Status PENDING  Incomplete      Radiology Studies: CT Lumbar Spine Wo Contrast  Result Date: 12/06/2022 CLINICAL DATA:  Fall EXAM: CT LUMBAR SPINE WITHOUT CONTRAST TECHNIQUE: Multidetector CT imaging of the lumbar spine was performed without intravenous contrast administration. Multiplanar CT image reconstructions were also generated. RADIATION DOSE REDUCTION: This exam was performed according to the departmental dose-optimization program which includes automated exposure control, adjustment of the mA and/or kV according to patient size and/or use of iterative reconstruction technique. COMPARISON:  Lumbar spine MRI 08/08/2017 CT abdomen pelvis 04/17/2022 FINDINGS: Segmentation: 5 lumbar type vertebrae. Alignment: Normal Vertebrae: Diffusely  abnormal bone marrow with more to multiple Schmorl's nodes, greatest at the superior endplate of L2 and L3. no acute fracture. Paraspinal and other soft tissues: Bilateral pleural effusions, right greater than left. Calcific aortic atherosclerosis. Peripherally calcified low-density pancreatic head mass measures 3.2 cm, decreased in size compared to CT abdomen pelvis 04/17/2022. Disc levels: No spinal canal stenosis. Severe left L5 neural foraminal stenosis. IMPRESSION: 1. Diffusely abnormal bone marrow, unchanged. This may be a sequela of leukemia treatment. 2. No acute fracture. 3. No spinal canal stenosis. Severe left L5 neural foraminal stenosis. 4. Bilateral pleural effusions, right greater than left. 5. Peripherally calcified low-density pancreatic head lesion measures 3.2 cm, decreased in size compared to CT abdomen pelvis 04/17/2022. Aortic Atherosclerosis (ICD10-I70.0). Electronically Signed   By: Ulyses Jarred M.D.   On: 12/06/2022 23:11   CT Head Wo Contrast  Result Date: 12/06/2022 CLINICAL DATA:  Status post fall. EXAM: CT HEAD WITHOUT CONTRAST CT CERVICAL SPINE WITHOUT CONTRAST TECHNIQUE: Multidetector CT imaging of the head and cervical spine was performed following the standard protocol without intravenous contrast. Multiplanar CT image reconstructions of the cervical spine were also generated. RADIATION DOSE REDUCTION: This exam was performed according to the departmental dose-optimization program which includes automated exposure control, adjustment of the mA and/or kV according to patient size and/or use of iterative reconstruction technique. COMPARISON:  August 29, 2022 FINDINGS: CT HEAD FINDINGS Brain: There is mild cerebral atrophy with widening of the extra-axial spaces and ventricular dilatation. There are areas of decreased attenuation within the white matter tracts of the  supratentorial brain, consistent with microvascular disease changes. A stable 1.6 cm x 1.5 cm x 1.6 cm focal  calcification is seen along the anterior aspect of the interhemispheric fissure. Vascular: No hyperdense vessels are identified. Skull: Normal. Negative for fracture or focal lesion. Sinuses/Orbits: No acute finding. Other: None. CT CERVICAL SPINE FINDINGS Alignment: Normal. Skull base and vertebrae: No acute fracture. Ill-defined lytic and sclerotic areas are seen scattered throughout the visualized osseous structures. Soft tissues and spinal canal: No prevertebral fluid or swelling. No visible canal hematoma. Disc levels: Mild anterior osteophyte formation is seen at the levels of C3-C4, C4-C5, C5-C6 and C6-C7. There is moderate severity narrowing of the anterior atlantoaxial articulation. Mild multilevel intervertebral disc space narrowing is seen. Bilateral moderate severity multilevel facet joint hypertrophy is noted. Upper chest: Posterior right apical airspace disease is noted with an adjacent pleural effusion. Other: N/A IMPRESSION: 1. No acute intracranial abnormality. 2. Findings suggestive of a stable calcified meningioma along the anterior aspect of the interhemispheric fissure. 3. No acute fracture or subluxation of the cervical spine. 4. Ill-defined lytic and sclerotic areas scattered throughout the visualized osseous structures, concerning for osseous metastatic disease. 5. Posterior right apical airspace disease with an adjacent pleural effusion. Electronically Signed   By: Virgina Norfolk M.D.   On: 12/06/2022 22:31   CT Cervical Spine Wo Contrast  Result Date: 12/06/2022 CLINICAL DATA:  Status post fall. EXAM: CT HEAD WITHOUT CONTRAST CT CERVICAL SPINE WITHOUT CONTRAST TECHNIQUE: Multidetector CT imaging of the head and cervical spine was performed following the standard protocol without intravenous contrast. Multiplanar CT image reconstructions of the cervical spine were also generated. RADIATION DOSE REDUCTION: This exam was performed according to the departmental dose-optimization program  which includes automated exposure control, adjustment of the mA and/or kV according to patient size and/or use of iterative reconstruction technique. COMPARISON:  August 29, 2022 FINDINGS: CT HEAD FINDINGS Brain: There is mild cerebral atrophy with widening of the extra-axial spaces and ventricular dilatation. There are areas of decreased attenuation within the white matter tracts of the supratentorial brain, consistent with microvascular disease changes. A stable 1.6 cm x 1.5 cm x 1.6 cm focal calcification is seen along the anterior aspect of the interhemispheric fissure. Vascular: No hyperdense vessels are identified. Skull: Normal. Negative for fracture or focal lesion. Sinuses/Orbits: No acute finding. Other: None. CT CERVICAL SPINE FINDINGS Alignment: Normal. Skull base and vertebrae: No acute fracture. Ill-defined lytic and sclerotic areas are seen scattered throughout the visualized osseous structures. Soft tissues and spinal canal: No prevertebral fluid or swelling. No visible canal hematoma. Disc levels: Mild anterior osteophyte formation is seen at the levels of C3-C4, C4-C5, C5-C6 and C6-C7. There is moderate severity narrowing of the anterior atlantoaxial articulation. Mild multilevel intervertebral disc space narrowing is seen. Bilateral moderate severity multilevel facet joint hypertrophy is noted. Upper chest: Posterior right apical airspace disease is noted with an adjacent pleural effusion. Other: N/A IMPRESSION: 1. No acute intracranial abnormality. 2. Findings suggestive of a stable calcified meningioma along the anterior aspect of the interhemispheric fissure. 3. No acute fracture or subluxation of the cervical spine. 4. Ill-defined lytic and sclerotic areas scattered throughout the visualized osseous structures, concerning for osseous metastatic disease. 5. Posterior right apical airspace disease with an adjacent pleural effusion. Electronically Signed   By: Virgina Norfolk M.D.   On:  12/06/2022 22:31   DG Pelvis Portable  Result Date: 12/06/2022 CLINICAL DATA:  Trauma, fall EXAM: PORTABLE PELVIS 1-2 VIEWS COMPARISON:  None Available.  FINDINGS: There is previous internal fixation right femur with intramedullary rod. No definite recent displaced fracture is seen. There is joint space narrowing in both hips. Arterial calcifications are seen in soft tissues. Degenerative changes are noted in the visualized lower lumbar spine. IMPRESSION: No recent displaced fracture is seen. Previous internal fixation in right femur. Lumbar spondylosis.  Arteriosclerosis. Electronically Signed   By: Elmer Picker M.D.   On: 12/06/2022 21:58   DG Chest Portable 1 View  Result Date: 12/06/2022 CLINICAL DATA:  Recent fall with weakness, initial encounter EXAM: PORTABLE CHEST 1 VIEW COMPARISON:  11/06/2022 FINDINGS: Cardiac shadow is enlarged but stable. Aortic calcifications are again seen. Chest wall port is again noted on the right and stable. The lungs are well aerated bilaterally. No focal infiltrate is seen. Chronic scarring is noted in the left medial lung base. No acute bony abnormality is noted. IMPRESSION: No acute abnormality seen. Electronically Signed   By: Inez Catalina M.D.   On: 12/06/2022 21:57      LOS: 1 day    Cordelia Poche, MD [facility] 12/08/2022, 8:44 AM   If 7PM-7AM, please contact night-coverage [email or web address]

## 2022-12-08 NOTE — Evaluation (Signed)
Occupational Therapy Evaluation Patient Details Name: [name] MRN: [id number] DOB: 04/23/1941 Today's Date: 12/08/2022   History of Present Illness 82 y.o. female admitted to [facility] on 12/06/2022 with suspected acute cystitis after presenting from home to [facility] ED complaining of generalized weakness and fall. PMH: AML, breast cancer, paroxysmal atrial fibrillation chronically anticoagulated on Eliquis, chronic diastolic heart failure, type 2 diabetes mellitus, CKD 4 associated baseline creatinine 1.8-3.0, anemia of chronic disease associated baseline hemoglobin 8-10,   Clinical Impression   Pt reports independence at baseline with ADLs and uses RW for mobility. Pt currently reporting 6/10 back pain despite premedication, needing min-max A for ADLs, min guard for bed mobility, and min A for transfers with RW. VSS on 3L O2 during session. Pt presenting with impairments listed below, will follow acutely. Recommend HHOT at d/c.     Recommendations for follow up therapy are one component of a multi-disciplinary discharge planning process, led by the attending physician.  Recommendations may be updated based on patient status, additional functional criteria and insurance authorization.   Follow Up Recommendations  Home health OT     Assistance Recommended at Discharge Intermittent Supervision/Assistance  Patient can return home with the following A little help with walking and/or transfers;A lot of help with bathing/dressing/bathroom;Assistance with cooking/housework;Assist for transportation;Help with stairs or ramp for entrance;Direct supervision/assist for medications management;Direct supervision/assist for financial management    Functional Status Assessment  Patient has had a recent decline in their functional status and demonstrates the ability to make significant improvements in function in a reasonable and predictable amount of time.  Equipment Recommendations  BSC/3in1     Recommendations for Other Services PT consult     Precautions / Restrictions Precautions Precautions: Fall Restrictions Weight Bearing Restrictions: No      Mobility Bed Mobility Overal bed mobility: Needs Assistance Bed Mobility: Sidelying to Sit   Sidelying to sit: Min guard       General bed mobility comments: increased time and use of bed rails    Transfers Overall transfer level: Needs assistance Equipment used: Rolling walker (2 wheels) Transfers: Sit to/from Stand Sit to Stand: Min assist                  Balance Overall balance assessment: Needs assistance Sitting-balance support: Feet supported Sitting balance-Leahy Scale: Fair     Standing balance support: During functional activity Standing balance-Leahy Scale: Good Standing balance comment: reliant on external support                           ADL either performed or assessed with clinical judgement   ADL Overall ADL's : Needs assistance/impaired Eating/Feeding: Set up   Grooming: Minimal assistance   Upper Body Bathing: Minimal assistance   Lower Body Bathing: Maximal assistance   Upper Body Dressing : Minimal assistance   Lower Body Dressing: Maximal assistance   Toilet Transfer: Minimal assistance;Rolling walker (2 wheels);Ambulation;Regular Toilet           Functional mobility during ADLs: Minimal assistance;Rolling walker (2 wheels)       Vision   Vision Assessment?: No apparent visual deficits     Perception Perception Perception Tested?: No   Praxis Praxis Praxis tested?: Not tested    Pertinent Vitals/Pain Pain Assessment Pain Assessment: Faces Pain Score: 6  Faces Pain Scale: Hurts even more Pain Location: low back Pain Descriptors / Indicators: Discomfort Pain Intervention(s): Limited activity within patient's tolerance  Hand Dominance Right   Extremity/Trunk Assessment Upper Extremity Assessment Upper Extremity Assessment: Generalized  weakness   Lower Extremity Assessment Lower Extremity Assessment: Generalized weakness   Cervical / Trunk Assessment Cervical / Trunk Assessment: Kyphotic   Communication Communication Communication: No difficulties   Cognition Arousal/Alertness: Lethargic Behavior During Therapy: WFL for tasks assessed/performed Overall Cognitive Status: Within Functional Limits for tasks assessed                                 General Comments: slow processing noted, increased time to follow commands     General Comments  SpO2 >90%O2 with 3L O2 via [place]  max HR noted with mobility 103 bpm    Exercises     Shoulder Instructions      Home Living Family/patient expects to be discharged to:: Private residence Living Arrangements: Spouse/significant other Available Help at Discharge: Family;Available 24 hours/day Type of Home: House Home Access: Stairs to enter [employer] of Steps: 1  step Entrance Stairs-Rails: Can reach both Home Layout: One level     Bathroom Shower/Tub: Teacher, early years/pre: Handicapped height     Home Equipment: Conservation officer, nature (2 wheels);Tub bench;Wheelchair - manual   Additional Comments: does not wear O2 at home      Prior Functioning/Environment               Mobility Comments: uses RW ADLs Comments: reports ind, however previous admission needed assist for LB dressing        OT Problem List: Decreased strength;Decreased range of motion;Decreased activity tolerance;Impaired balance (sitting and/or standing);Decreased cognition;Decreased safety awareness;Pain      OT Treatment/Interventions: Self-care/ADL training;Therapeutic exercise;Energy conservation;DME and/or AE instruction;Therapeutic activities;Balance training;Patient/family education    OT Goals(Current goals can be found in the care plan section) Acute Rehab OT Goals Patient Stated Goal: none stated OT Goal Formulation: With patient Time For Goal  Achievement: 12/22/22 Potential to Achieve Goals: Good ADL Goals Pt Will Perform Upper Body Dressing: with modified independence;sitting Pt Will Perform Lower Body Dressing: with modified independence;with adaptive equipment;sit to/from stand;sitting/lateral leans Pt Will Transfer to Toilet: with modified independence;ambulating;regular height toilet Pt Will Perform Tub/Shower Transfer: Tub transfer;Shower transfer;with modified independence;ambulating;tub bench;rolling walker  OT Frequency: Min 2X/week    Co-evaluation              AM-PAC OT "6 Clicks" Daily Activity     Outcome Measure Help from another person eating meals?: None Help from another person taking care of personal grooming?: A Little Help from another person toileting, which includes using toliet, bedpan, or urinal?: A Lot Help from another person bathing (including washing, rinsing, drying)?: A Lot Help from another person to put on and taking off regular upper body clothing?: A Little Help from another person to put on and taking off regular lower body clothing?: A Lot 6 Click Score: 16   End of Session Equipment Utilized During Treatment: Gait belt;Rolling walker (2 wheels);Oxygen Nurse Communication: Mobility status  Activity Tolerance: Patient tolerated treatment well Patient left: Other (comment) (handoff in room to PT)  OT Visit Diagnosis: Unsteadiness on feet (R26.81);Other abnormalities of gait and mobility (R26.89);Muscle weakness (generalized) (M62.81);History of falling (Z91.81)                Time: 3016-0109 OT Time Calculation (min): 17 min Charges:  OT General Charges $OT Visit: 1 Visit OT Evaluation $OT Eval Moderate Complexity: 1 Mod  Genee Rann  K, OTD, OTR/L SecureChat Preferred Acute Rehab [phone]  Erasmus Bistline K Koonce 12/08/2022, 11:07 AM

## 2022-12-09 ENCOUNTER — Inpatient Hospital Stay (HOSPITAL_COMMUNITY): Payer: Medicare HMO

## 2022-12-09 DIAGNOSIS — N3000 Acute cystitis without hematuria: Secondary | ICD-10-CM | POA: Diagnosis not present

## 2022-12-09 DIAGNOSIS — R531 Weakness: Secondary | ICD-10-CM | POA: Diagnosis not present

## 2022-12-09 DIAGNOSIS — E785 Hyperlipidemia, unspecified: Secondary | ICD-10-CM | POA: Diagnosis not present

## 2022-12-09 DIAGNOSIS — I480 Paroxysmal atrial fibrillation: Secondary | ICD-10-CM | POA: Diagnosis not present

## 2022-12-09 LAB — GLUCOSE, CAPILLARY
Glucose-Capillary: 103 mg/dL — ABNORMAL HIGH (ref 70–99)
Glucose-Capillary: 108 mg/dL — ABNORMAL HIGH (ref 70–99)
Glucose-Capillary: 112 mg/dL — ABNORMAL HIGH (ref 70–99)
Glucose-Capillary: 165 mg/dL — ABNORMAL HIGH (ref 70–99)
Glucose-Capillary: 72 mg/dL (ref 70–99)
Glucose-Capillary: 85 mg/dL (ref 70–99)

## 2022-12-09 LAB — RENAL FUNCTION PANEL
Albumin: 2.4 g/dL — ABNORMAL LOW (ref 3.5–5.0)
Anion gap: 12 (ref 5–15)
BUN: 74 mg/dL — ABNORMAL HIGH (ref 8–23)
CO2: 20 mmol/L — ABNORMAL LOW (ref 22–32)
Calcium: 5.7 mg/dL — CL (ref 8.9–10.3)
Chloride: 109 mmol/L (ref 98–111)
Creatinine, Ser: 3.02 mg/dL — ABNORMAL HIGH (ref 0.44–1.00)
GFR, Estimated: 15 mL/min — ABNORMAL LOW (ref >60)
Glucose, Bld: 96 mg/dL (ref 70–99)
Phosphorus: 7.3 mg/dL — ABNORMAL HIGH (ref 2.5–4.6)
Potassium: 3.2 mmol/L — ABNORMAL LOW (ref 3.5–5.1)
Sodium: 141 mmol/L (ref 135–145)

## 2022-12-09 LAB — HEMOGLOBIN AND HEMATOCRIT, BLOOD
HCT: 23.5 % — ABNORMAL LOW (ref 36.0–46.0)
Hemoglobin: 7.2 g/dL — ABNORMAL LOW (ref 12.0–15.0)

## 2022-12-09 LAB — CBC
HCT: 23.8 % — ABNORMAL LOW (ref 36.0–46.0)
Hemoglobin: 7.4 g/dL — ABNORMAL LOW (ref 12.0–15.0)
MCH: 30 pg (ref 26.0–34.0)
MCHC: 31.1 g/dL (ref 30.0–36.0)
MCV: 96.4 fL (ref 80.0–100.0)
Platelets: 134 10*3/uL — ABNORMAL LOW (ref 150–400)
RBC: 2.47 MIL/uL — ABNORMAL LOW (ref 3.87–5.11)
RDW: 14.3 % (ref 11.5–15.5)
WBC: 9.8 10*3/uL (ref 4.0–10.5)
nRBC: 0 % (ref 0.0–0.2)

## 2022-12-09 LAB — URINE CULTURE: Culture: >=100000 — AB

## 2022-12-09 LAB — CALCIUM: Calcium: 5.4 mg/dL — CL (ref 8.9–10.3)

## 2022-12-09 LAB — MAGNESIUM: Magnesium: 1.5 mg/dL — ABNORMAL LOW (ref 1.7–2.4)

## 2022-12-09 MED ORDER — MAGNESIUM SULFATE 2 GM/50ML IV SOLN: 2.0000 g | Freq: Once | INTRAVENOUS | Status: DC

## 2022-12-09 MED ORDER — CALCIUM ACETATE (PHOS BINDER) 667 MG PO CAPS
1334.0000 mg | ORAL_CAPSULE | Freq: Three times a day (TID) | ORAL | Status: DC
  Administered 2022-12-09 – 2022-12-26 (×48): 1334 mg via ORAL
  Filled 2022-12-09 (×48): qty 2

## 2022-12-09 MED ORDER — MAGNESIUM SULFATE 2 GM/50ML IV SOLN
2.0000 g | Freq: Once | INTRAVENOUS | Status: AC
  Administered 2022-12-09: 2 g via INTRAVENOUS
  Filled 2022-12-09: qty 50

## 2022-12-09 MED ORDER — CALCIUM CARBONATE ANTACID 500 MG PO CHEW
400.0000 mg | CHEWABLE_TABLET | Freq: Three times a day (TID) | ORAL | Status: DC
  Administered 2022-12-09: 400 mg via ORAL
  Filled 2022-12-09: qty 2

## 2022-12-09 MED ORDER — SODIUM BICARBONATE 650 MG PO TABS
650.0000 mg | ORAL_TABLET | Freq: Three times a day (TID) | ORAL | Status: DC
  Administered 2022-12-09 – 2022-12-19 (×29): 650 mg via ORAL
  Filled 2022-12-09 (×29): qty 1

## 2022-12-09 MED ORDER — STERILE WATER FOR INJECTION IV SOLN
INTRAVENOUS | Status: DC
  Filled 2022-12-09 (×2): qty 1000

## 2022-12-09 NOTE — Progress Notes (Signed)
Critical Ca  of 5.7 called from Lab. Dr Lonny Prude at bedside and made aware. Updated Stanton Kidney RN as well. Jessie Foot, RN

## 2022-12-09 NOTE — TOC Initial Note (Signed)
Transition of Care [facility]) - Initial/Assessment Note    Patient Details  Name: [name] MRN: [id number] Date of Birth: 07-23-1941  Transition of Care [facility]) CM/SW Contact:    Catherine Roys, RN Phone Number: 12/09/2022, 4:36 PM  Clinical Narrative: Risk for readmission assessment completed. PTA patient was from home with spouse. Patient has a daughter [name] that tries to visit her mom; however, the family has some dynamics that limits visit times. Case Manager was able to speak with the Workers Comp Case Manager [name] following the patient from a previous claim regarding the patient. Workers Comp Tourist information centre manager had concerns regarding the patients diabetes and her meal choices. Case Manager was able to speak with the patient and she is agreeable to home health services- RN, PT,OT, Aide Services- Medicare.gov list presented and patient chose [facility]. Referral submitted to [facility] and start of care to begin within 24-48 hours post transition home. Case Manager educated the patient on the need to allow the agency in the home to reduce the risk for readmission to the hospital. DME bedside commode ordered via [place]. Case Manager will continue to follow for additional transition of care needs as the patient progresses.                Expected Discharge Plan: [place] Barriers to Discharge: Continued Medical Work up   Patient Goals and CMS Choice Patient states their goals for this hospitalization and ongoing recovery are:: patient wants to return home. CMS Medicare.gov Compare Post Acute Care list provided to:: Patient Choice offered to / list presented to : Patient      Expected Discharge Plan and Services In-house Referral: NA Discharge Planning Services: CM Consult Post Acute Care Choice: [place] arrangements for the past 2 months: Single Family Home                 DME Arranged: Bedside commode DME Agency: [employer] Date DME  Agency Contacted: 12/09/22 Time DME Agency Contacted: [phone] Representative spoke with at DME Agency: [facility] HH Arranged: RN, Disease Management, PT, OT, Nurse's Aide [place] Agency: [place] Date [place]: 12/09/22 Time [place]: 1636 Representative spoke with at [place]: [place] Arrangements/Services Living arrangements for the past 2 months: [place] with:: Spouse Patient language and need for interpreter reviewed:: Yes Do you feel safe going back to the place where you live?: Yes      Need for Family Participation in Patient Care: Yes (Comment) Care giver support system in place?: Yes (comment) Current home services: DME Criminal Activity/Legal Involvement Pertinent to Current Situation/Hospitalization: No - Comment as needed  Activities of Daily Living Home Assistive Devices/Equipment: CBG Meter, Walker (specify type) ADL Screening (condition at time of admission) Patient's cognitive ability adequate to safely complete daily activities?: Yes Is the patient deaf or have difficulty hearing?: No Does the patient have difficulty seeing, even when wearing glasses/contacts?: No Does the patient have difficulty concentrating, remembering, or making decisions?: No Patient able to express need for assistance with ADLs?: Yes Does the patient have difficulty dressing or bathing?: No Independently performs ADLs?: Yes (appropriate for developmental age) Does the patient have difficulty walking or climbing stairs?: Yes Weakness of Legs: Both Weakness of Arms/Hands: Both  Permission Sought/Granted Permission sought to share information with : Family Supports, Customer service manager, Case Production designer, theatre/television/film granted to share info w AGENCY: [name]  Emotional Assessment Appearance:: Appears stated age Attitude/Demeanor/Rapport: Engaged Affect (typically observed): Appropriate Orientation: :  Oriented to Self, Oriented to Place, Oriented to  Time, Oriented to Situation Alcohol / Substance Use: Not Applicable Psych Involvement: No (comment)  Admission diagnosis:  Acute cystitis [N30.00] Weakness [R53.1] Patient Active Problem List   Diagnosis Date Noted   Acute cystitis 12/07/2022   Generalized weakness 12/07/2022   Hypocalcemia 12/07/2022   History of seizures 12/07/2022   COPD (chronic obstructive pulmonary disease) ([place]) 10/25/2022   Persistent atrial fibrillation ([place]) 10/22/2022   Acute on chronic diastolic (congestive) heart failure ([place]) 10/16/2022   Chronic pain disorder 10/16/2022   Intertrochanteric fracture of right hip ([place]) 09/05/2022   Acute urinary retention 09/01/2022   Hip fracture ([place]) 08/29/2022   Paroxysmal atrial fibrillation (HCC)    Abdominal pain 04/17/2022   Pulmonary nodule 04/17/2022   DNR (do not resuscitate) 04/17/2022   AKI (acute kidney injury) ([place]) 12/06/2021   Hyperglycemia 12/06/2021   Seizure ([place]) 12/05/2021   Acute on chronic diastolic CHF (congestive heart failure) ([place]) 05/30/2020   Genetic testing 12/29/2019   Family history of esophageal cancer    Family history of lung cancer    Family history of cervical cancer    Family history of skin cancer    Family history of leukemia    History of uterine cancer    Carcinoma of upper-outer quadrant of left breast in female, estrogen receptor positive ([place]) 10/17/2019   Type 2 diabetes mellitus with hyperlipidemia ([place]) 05/04/2019   DM2 (diabetes mellitus, type 2) ([place]) 05/04/2019   Diabetes mellitus ([place]) 05/04/2019   Morbid (severe) obesity due to excess calories ([place]) 10/27/2018   Localization-related (focal) (partial) symptomatic epilepsy and epileptic syndromes with complex partial seizures, not intractable, without status epilepticus ([place]) 10/27/2018   Expressive aphasia 11/21/2017   Hyponatremia 11/21/2017   TIA (transient ischemic attack)    Chest pain 09/24/2017    History of non-ST elevation myocardial infarction (NSTEMI)    Acute on chronic combined systolic and diastolic heart failure ([place])    Vitamin B12 deficiency 10/29/2016   Port catheter in place 05/30/2016   Other pancytopenia ([place]) 08/09/2014   Leukemia, acute ([place]) 06/08/2014   History of TIA (transient ischemic attack) 05/24/2014   CKD (chronic kidney disease) stage 4, GFR 15-29 ml/min (HCC) 05/24/2014   AML (acute myeloblastic leukemia) ([place]) 05/24/2014   Acute leukemia ([place]) 03/02/2014   Cervical spine pain 02/24/2014   Alkaline phosphatase elevation 02/24/2014   Anemia of chronic disease 02/24/2014   Chronic back pain 02/13/2014   PAF- flutter 02/04/2014   Pancreatitis 01/29/2014   Lesion of pancreas 01/29/2014   HIP PAIN, LEFT 01/26/2008   Hyperlipidemia 12/24/2007   CAD (coronary artery disease) 12/24/2007   CHEST PAIN, ATYPICAL 12/24/2007   NEPHROLITHIASIS, HX OF 12/24/2007   Insulin dependent diabetes mellitus 07/13/2007   Essential hypertension 07/13/2007   PCP:  Isaac Bliss, Rayford Halsted, MD Pharmacy:   [facility] [phone] - [place], [place] - [address] AT [place] [address] Phone: [phone] Fax: [phone]  [facility] (New Address) - [place], [place] - [place] AT Previously: [address] [place] [address] Phone: [phone] Fax: [phone]  [facility] - [place] [place] [address] Phone: [phone] Fax: [phone]  Social Determinants of Health (SDOH) Social History: SDOH Screenings   Food Insecurity: No Food Insecurity (12/08/2022)  Housing: Low Risk  (12/08/2022)  Transportation Needs: No Transportation Needs (12/08/2022)  Utilities: Not At Risk (12/08/2022)  Alcohol Screen: Low Risk   (10/22/2022)  Depression (PHQ2-9): Low Risk  (10/03/2022)  Financial Resource Strain: Low Risk  (10/22/2022)  Stress: No Stress Concern Present (05/12/2022)  Tobacco Use: Medium Risk (12/07/2022)   SDOH Interventions: Housing Interventions: Intervention Not Indicated   Readmission Risk Interventions    12/09/2022    4:35 PM  Readmission Risk Prevention Plan  Transportation Screening Complete  Medication Review Press photographer) Complete  HRI or [place] Complete  SW Recovery Care/Counseling Consult Complete  Palliative Care Screening Not [place] Not Applicable

## 2022-12-09 NOTE — Progress Notes (Signed)
PROGRESS NOTE    [name]  XHB:[id number] DOB: 1941/05/25 DOA: 12/06/2022 PCP: Isaac Bliss, Rayford Halsted, MD   Brief Narrative: [name] is a 82 y.o. female with a history of AML, breast cancer, paroxysmal atrial fibrillation on Eliquis, chronic diastolic heart failure, diabetes mellitus type 2, CKD stage IV. Patient presented secondary to generalized weakness and found to have evidence of UTI, hypocalcemia and acute heart failure. Patient started on calcium replacement, empiric Ceftriaxone and given Lasix. Associated hyperphosphatemia.   Assessment and Plan:  Acute cystitis Presumed secondary to weakness and abnormal urinalysis. Urine culture obtained and empiric Ceftriaxone started. Urine culture significant for Klebsiella pneumoniae, resistant to ampicillin and nitrofurantoin. Patient has completed three doses of Ceftriaxone. -Discontinue Ceftriaxone  Generalized weakness Noted on admission. Possibly related to acute illness however, possibly related to hypocalcemia. PT/OT recommend home health on discharge.  Acute on chronic diastolic heart failure Present on admission per history. BNP of 849.4. Patient given Lasix 20 mg IV on admission. Lasix held. No pulmonary edema.  Hypocalcemia Patient with associated hyperphosphatemia. Likely related to renal impairment. Patient has received multiple doses of calcium gluconate; still with persistent hypocalcemia. Corrected calcium for albumin of 7 this morning which is improved from 6.5 the day prior. Ionized calcium (12/29) slightly low. -Calcium acetate  AKI on CKD stage IV Baseline creatinine of about 2 with more recent creatinine of 4.33. Creatinine of 3.03 on admission. Creatinine stable. Patient appears overloaded, although she was hypotensive on admission. Nephrology, Dr. Carolin Sicks, recommends outpatient follow-up -Repeat metabolic panel daily -Renal ultrasound  Hyperphosphatemia Likely secondary to kidney disease.  Discussed with nephrology who recommended transition to calcium acetate -Calcium acetate -Renal diet  Hypomagnesemia Given magnesium supplementation today -Repeat magnesium in AM  Metabolic acidosis Secondary to CKD. Slight worsening. Started on IV sodium bicarb -Continue sodium bicarb  Hyperlipidemia -Continue Lipitor  Diabetes mellitus, type 2 Hypoglycemia on 1/1. Semglee discontinued. -Continue SSI  Paroxysmal atrial fibrillation -Continue Eliquis  History of seizures -Continue Keppra  Anemia of chronic disease Baseline hemoglobin of about 8-9. Hemoglobin of 8.1 on admission with drift down to 7.2. No evidence of hemorrhage. Hemoglobin on CBC this morning is trended down again. -Repeat H&H this afternoon -CBC in AM  AML Noted. In remission. Patient follows with Dr. [name] as an outpatient.  CAD Patient with a history of NSTEMI, treated medically. Patient is on Plavix, Lipitor, Imdur and metoprolol succinate. Antihypertensives are held on admission secondary to hypotension. -Continue Plavix   DVT prophylaxis: Eliquis Code Status:   Code Status: DNR Family Communication: None at bedside. Patient declined for me to update family Disposition Plan: Discharge home likely in 2-4 days pending transition to oral antibiotics/culture data and improvement/stability of hypocalcemia/hyperphosphatemia   Consultants:  None  Procedures:  None  Antimicrobials: Ceftriaxone IV   Subjective: Pain is improved with analgesics. Some mild dyspnea. No other concerns.  Objective: BP 113/67 (BP Location: Right Arm)   Pulse 100   Temp 98.6 F (37 C) (Oral)   Resp 18   Ht '5\' 2"'$  (1.575 m)   Wt 74.5 kg   SpO2 (!) 75%   BMI 30.04 kg/m   Examination:  General exam: Appears calm and comfortable Respiratory system: Clear to auscultation. Respiratory effort normal. Cardiovascular system: S1 & S2 heard, irregular rhythm, normal rate. Gastrointestinal system: Abdomen is  nondistended, soft and nontender. Normal bowel sounds heard. Central nervous system: Alert and oriented. No focal neurological deficits. Musculoskeletal: BLE edema at ankles. No calf tenderness  Psychiatry: Judgement and insight appear normal. Mood & affect appropriate.     Data Reviewed: I have personally reviewed following labs and imaging studies  CBC Lab Results  Component Value Date   WBC 9.8 12/09/2022   RBC 2.47 (L) 12/09/2022   HGB 7.4 (L) 12/09/2022   HCT 23.8 (L) 12/09/2022   MCV 96.4 12/09/2022   MCH 30.0 12/09/2022   PLT 134 (L) 12/09/2022   MCHC 31.1 12/09/2022   RDW 14.3 12/09/2022   LYMPHSABS 1.0 12/07/2022   MONOABS 0.3 12/07/2022   EOSABS 0.1 12/07/2022   BASOSABS 0.0 79/01/4096     Last metabolic panel Lab Results  Component Value Date   NA 141 12/09/2022   K 4.2 12/09/2022   CL 109 12/09/2022   CO2 20 (L) 12/09/2022   BUN 74 (H) 12/09/2022   CREATININE 3.02 (H) 12/09/2022   GLUCOSE 96 12/09/2022   GFRNONAA 15 (L) 12/09/2022   GFRAA 39 (L) 10/04/2020   CALCIUM 5.7 (LL) 12/09/2022   PHOS 7.3 (H) 12/09/2022   PROT 5.5 (L) 12/08/2022   ALBUMIN 2.4 (L) 12/09/2022   BILITOT 0.5 12/08/2022   ALKPHOS 98 12/08/2022   AST 12 (L) 12/08/2022   ALT 8 12/08/2022   ANIONGAP 12 12/09/2022    GFR: Estimated Creatinine Clearance: 14 mL/min (A) (by C-G formula based on SCr of 3.02 mg/dL (H)).  Recent Results (from the past 240 hour(s))  Resp panel by RT-PCR (RSV, Flu A&B, Covid) Anterior Nasal Swab     Status: None   Collection Time: 12/06/22 11:45 PM   Specimen: Anterior Nasal Swab  Result Value Ref Range Status   SARS Coronavirus 2 by RT PCR NEGATIVE NEGATIVE Final    Comment: (NOTE) SARS-CoV-2 target nucleic acids are NOT DETECTED.  The SARS-CoV-2 RNA is generally detectable in upper respiratory specimens during the acute phase of infection. The lowest concentration of SARS-CoV-2 viral copies this assay can detect is 138 copies/mL. A negative result  does not preclude SARS-Cov-2 infection and should not be used as the sole basis for treatment or other patient management decisions. A negative result may occur with  improper specimen collection/handling, submission of specimen other than nasopharyngeal swab, presence of viral mutation(s) within the areas targeted by this assay, and inadequate number of viral copies(<138 copies/mL). A negative result must be combined with clinical observations, patient history, and epidemiological information. The expected result is Negative.  Fact Sheet for Patients:  EntrepreneurPulse.com.au  Fact Sheet for Healthcare Providers:  IncredibleEmployment.be  This test is no t yet approved or cleared by the [place] FDA and  has been authorized for detection and/or diagnosis of SARS-CoV-2 by FDA under an Emergency Use Authorization (EUA). This EUA will remain  in effect (meaning this test can be used) for the duration of the COVID-19 declaration under Section 564(b)(1) of the Act, 21 U.S.C.section 360bbb-3(b)(1), unless the authorization is terminated  or revoked sooner.       Influenza A by PCR NEGATIVE NEGATIVE Final   Influenza B by PCR NEGATIVE NEGATIVE Final    Comment: (NOTE) The Xpert Xpress SARS-CoV-2/FLU/RSV plus assay is intended as an aid in the diagnosis of influenza from Nasopharyngeal swab specimens and should not be used as a sole basis for treatment. Nasal washings and aspirates are unacceptable for Xpert Xpress SARS-CoV-2/FLU/RSV testing.  Fact Sheet for Patients: EntrepreneurPulse.com.au  Fact Sheet for Healthcare Providers: IncredibleEmployment.be  This test is not yet approved or cleared by the [place] and has been authorized  for detection and/or diagnosis of SARS-CoV-2 by FDA under an Emergency Use Authorization (EUA). This EUA will remain in effect (meaning this test can be used) for  the duration of the COVID-19 declaration under Section 564(b)(1) of the Act, 21 U.S.C. section 360bbb-3(b)(1), unless the authorization is terminated or revoked.     Resp Syncytial Virus by PCR NEGATIVE NEGATIVE Final    Comment: (NOTE) Fact Sheet for Patients: EntrepreneurPulse.com.au  Fact Sheet for Healthcare Providers: IncredibleEmployment.be  This test is not yet approved or cleared by the [place] FDA and has been authorized for detection and/or diagnosis of SARS-CoV-2 by FDA under an Emergency Use Authorization (EUA). This EUA will remain in effect (meaning this test can be used) for the duration of the COVID-19 declaration under Section 564(b)(1) of the Act, 21 U.S.C. section 360bbb-3(b)(1), unless the authorization is terminated or revoked.  Performed at [facility], [place] [address] [place], [place] [address]   Urine Culture     Status: Abnormal (Preliminary result)   Collection Time: 12/07/22 12:19 AM   Specimen: Urine, Clean Catch  Result Value Ref Range Status   Specimen Description URINE, CLEAN CATCH  Final   Special Requests NONE  Final   Culture (A)  Final    >=100,000 COLONIES/mL KLEBSIELLA PNEUMONIAE SUSCEPTIBILITIES TO FOLLOW Performed at [facility], [place] [address] [place], [place] [address]    Report Status PENDING  Incomplete      Radiology Studies: DG Lumbar Spine 2-3 Views  Result Date: 12/08/2022 CLINICAL DATA:  Back pain EXAM: LUMBAR SPINE - 2-3 VIEW COMPARISON:  Previous studies including radiograph done on 05/10/2013 and CT done on 12/06/2022 FINDINGS: No recent fracture is seen. Degenerative changes are noted with bony spurs, disc space narrowing and facet hypertrophy at multiple levels in lumbar spine and lower thoracic spine. There is interval progression of degenerative changes. There is slightly inhomogeneous attenuation in the vertebral bodies. Mottled appearance in bone marrow seen in  the CT is difficult to demonstrate in the radiographs. As far as seen, there is no break in the cortical margins. Endplate sclerosis is seen at multiple levels, most likely due to degenerative arthritis. IMPRESSION: No recent fracture is seen. Lumbar spondylosis with interval progression. Alignment of posterior margins of vertebral bodies appears normal. Electronically Signed   By: Elmer Picker M.D.   On: 12/08/2022 20:31      LOS: 2 days    Cordelia Poche, MD [facility] 12/09/2022, 8:03 AM   If 7PM-7AM, please contact night-coverage [email or web address]

## 2022-12-09 NOTE — Progress Notes (Signed)
Dr [place] Crane notified Ca 5.6 on repeat labs drawn this evening. Orders received. Jessie Foot, RN

## 2022-12-09 NOTE — Progress Notes (Signed)
Pts CBG 72, pt did not eat dinner tray and refused snack earlier.  Pt given Nepro nutritional supplement drink per her request for "something like an Ensure drink". Pt did drink entire carton. Will continue to monitor.

## 2022-12-10 ENCOUNTER — Inpatient Hospital Stay (HOSPITAL_COMMUNITY): Payer: Medicare HMO

## 2022-12-10 ENCOUNTER — Encounter (HOSPITAL_COMMUNITY): Payer: Medicare HMO

## 2022-12-10 DIAGNOSIS — N3000 Acute cystitis without hematuria: Secondary | ICD-10-CM | POA: Diagnosis not present

## 2022-12-10 LAB — RENAL FUNCTION PANEL
Albumin: 2.2 g/dL — ABNORMAL LOW (ref 3.5–5.0)
Anion gap: 11 (ref 5–15)
BUN: 73 mg/dL — ABNORMAL HIGH (ref 8–23)
CO2: 22 mmol/L (ref 22–32)
Calcium: 5.8 mg/dL — CL (ref 8.9–10.3)
Chloride: 104 mmol/L (ref 98–111)
Creatinine, Ser: 3.12 mg/dL — ABNORMAL HIGH (ref 0.44–1.00)
GFR, Estimated: 15 mL/min — ABNORMAL LOW (ref >60)
Glucose, Bld: 129 mg/dL — ABNORMAL HIGH (ref 70–99)
Phosphorus: 6.6 mg/dL — ABNORMAL HIGH (ref 2.5–4.6)
Potassium: 3.8 mmol/L (ref 3.5–5.1)
Sodium: 137 mmol/L (ref 135–145)

## 2022-12-10 LAB — GLUCOSE, CAPILLARY
Glucose-Capillary: 124 mg/dL — ABNORMAL HIGH (ref 70–99)
Glucose-Capillary: 169 mg/dL — ABNORMAL HIGH (ref 70–99)
Glucose-Capillary: 178 mg/dL — ABNORMAL HIGH (ref 70–99)
Glucose-Capillary: 183 mg/dL — ABNORMAL HIGH (ref 70–99)
Glucose-Capillary: 221 mg/dL — ABNORMAL HIGH (ref 70–99)

## 2022-12-10 LAB — CBC
HCT: 22.8 % — ABNORMAL LOW (ref 36.0–46.0)
Hemoglobin: 7.4 g/dL — ABNORMAL LOW (ref 12.0–15.0)
MCH: 30.5 pg (ref 26.0–34.0)
MCHC: 32.5 g/dL (ref 30.0–36.0)
MCV: 93.8 fL (ref 80.0–100.0)
Platelets: 128 10*3/uL — ABNORMAL LOW (ref 150–400)
RBC: 2.43 MIL/uL — ABNORMAL LOW (ref 3.87–5.11)
RDW: 14.5 % (ref 11.5–15.5)
WBC: 7.1 10*3/uL (ref 4.0–10.5)
nRBC: 0 % (ref 0.0–0.2)

## 2022-12-10 LAB — MAGNESIUM: Magnesium: 1.7 mg/dL (ref 1.7–2.4)

## 2022-12-10 LAB — PARATHYROID HORMONE, INTACT (NO CA): PTH: 321 pg/mL — ABNORMAL HIGH (ref 15–65)

## 2022-12-10 LAB — CALCIUM, IONIZED: Calcium, Ionized, Serum: 3.1 mg/dL — ABNORMAL LOW (ref 4.5–5.6)

## 2022-12-10 LAB — CALCITRIOL (1,25 DI-OH VIT D): Vit D, 1,25-Dihydroxy: 17.2 pg/mL — ABNORMAL LOW (ref 24.8–81.5)

## 2022-12-10 MED ORDER — MAGNESIUM SULFATE 2 GM/50ML IV SOLN
2.0000 g | Freq: Once | INTRAVENOUS | Status: AC
  Administered 2022-12-10: 2 g via INTRAVENOUS
  Filled 2022-12-10: qty 50

## 2022-12-10 MED ORDER — IPRATROPIUM-ALBUTEROL 0.5-2.5 (3) MG/3ML IN SOLN
3.0000 mL | Freq: Once | RESPIRATORY_TRACT | Status: AC
  Administered 2022-12-10: 3 mL via RESPIRATORY_TRACT
  Filled 2022-12-10: qty 3

## 2022-12-10 MED ORDER — FUROSEMIDE 10 MG/ML IJ SOLN
40.0000 mg | INTRAMUSCULAR | Status: AC
  Administered 2022-12-10: 40 mg via INTRAVENOUS
  Filled 2022-12-10: qty 4

## 2022-12-10 MED ORDER — VITAMIN D (ERGOCALCIFEROL) 1.25 MG (50000 UNIT) PO CAPS
50000.0000 [IU] | ORAL_CAPSULE | ORAL | Status: DC
  Administered 2022-12-10 – 2022-12-24 (×3): 50000 [IU] via ORAL
  Filled 2022-12-10 (×3): qty 1

## 2022-12-10 MED ORDER — MUPIROCIN 2 % EX OINT
TOPICAL_OINTMENT | Freq: Every day | CUTANEOUS | Status: DC
  Administered 2022-12-16 – 2022-12-23 (×3): 1 via TOPICAL
  Filled 2022-12-10: qty 22

## 2022-12-10 MED ORDER — CALCIUM GLUCONATE-NACL 2-0.675 GM/100ML-% IV SOLN
2.0000 g | Freq: Two times a day (BID) | INTRAVENOUS | Status: AC
  Administered 2022-12-10 (×2): 2000 mg via INTRAVENOUS
  Filled 2022-12-10 (×2): qty 100

## 2022-12-10 NOTE — Progress Notes (Signed)
Physical Therapy Treatment Patient Details Name: [name] MRN: [id number] DOB: 04/10/1941 Today's Date: 12/10/2022   History of Present Illness 82 y.o. female admitted to [facility] on 12/06/2022 with suspected acute cystitis after presenting from home to [facility] ED complaining of generalized weakness and fall. PMH: AML, breast cancer, paroxysmal atrial fibrillation chronically anticoagulated on Eliquis, chronic diastolic heart failure, type 2 diabetes mellitus, CKD 4 associated baseline creatinine 1.8-3.0, anemia of chronic disease associated baseline hemoglobin 8-10,    PT Comments    Pt sitting EoB just finished breakfast and requesting to return to bed because she is so cold. Agreeable to get up to chair if she is given warm blankets but reports she can not walk due to increased pain in her L foot. Pt is min A for power up to standing and stepping to recliner on R. Once in chair with feet elevated, PT visualized L foot and dorsum noted to have increased edema and rubor and great toe is dusky purple.  RN and MD notified. Pt report her husband can provide support needed to return home with HHPT.     Recommendations for follow up therapy are one component of a multi-disciplinary discharge planning process, led by the attending physician.  Recommendations may be updated based on patient status, additional functional criteria and insurance authorization.  Follow Up Recommendations  Home health PT     Assistance Recommended at Discharge Intermittent Supervision/Assistance  Patient can return home with the following A little help with walking and/or transfers;A little help with bathing/dressing/bathroom;Assistance with cooking/housework;Direct supervision/assist for medications management;Direct supervision/assist for financial management;Assist for transportation;Help with stairs or ramp for entrance   Equipment Recommendations  None recommended by PT    Recommendations for Other  Services       Precautions / Restrictions Precautions Precautions: Fall Restrictions Weight Bearing Restrictions: No     Mobility  Bed Mobility               General bed mobility comments: sitting EoB finishing breakfast, pt shivering. reluctantly agreeable to getting up to recliner however, reports limited by pain in mobility, weightbearing of L foot.    Transfers Overall transfer level: Needs assistance Equipment used: Rolling walker (2 wheels) Transfers: Sit to/from Stand, Bed to chair/wheelchair/BSC Sit to Stand: From elevated surface, Min assist   Step pivot transfers: Min assist       General transfer comment: pt able to power up from elevated surface, requires min A for steadying in standing and for stepping to recliner on her R    Ambulation/Gait               General Gait Details: deferred due to L foot pain         Balance Overall balance assessment: Needs assistance Sitting-balance support: Feet supported, No upper extremity supported Sitting balance-Leahy Scale: Fair     Standing balance support: Bilateral upper extremity supported, During functional activity, Reliant on assistive device for balance Standing balance-Leahy Scale: Poor                              Cognition Arousal/Alertness: Awake/alert Behavior During Therapy: Flat affect Overall Cognitive Status: Within Functional Limits for tasks assessed  Exercises  Ankle pumps x10     General Comments General comments (skin integrity, edema, etc.): VSS on 3L O2 via [place], pt with complaints of L foot pain, removed sock and dorsum of L foot with increased rubor and edema, L great toe dusky purple. pt reports imaging had been done however unable to find in chart.      Pertinent Vitals/Pain Pain Assessment Pain Assessment: Faces Faces Pain Scale: Hurts even more Pain Location: L foot Pain Descriptors /  Indicators: Discomfort Pain Intervention(s): Limited activity within patient's tolerance, Monitored during session, Repositioned     PT Goals (current goals can now be found in the care plan section) Acute Rehab PT Goals Patient Stated Goal: have less pain PT Goal Formulation: With patient Time For Goal Achievement: 12/22/22 Potential to Achieve Goals: Fair Progress towards PT goals: Not progressing toward goals - comment (limited by L foot pain)    Frequency    Min 3X/week      PT Plan Current plan remains appropriate       AM-PAC PT "6 Clicks" Mobility   Outcome Measure  Help needed turning from your back to your side while in a flat bed without using bedrails?: None Help needed moving from lying on your back to sitting on the side of a flat bed without using bedrails?: None Help needed moving to and from a bed to a chair (including a wheelchair)?: A Little Help needed standing up from a chair using your arms (e.g., wheelchair or bedside chair)?: A Little Help needed to walk in hospital room?: A Little Help needed climbing 3-5 steps with a railing? : A Lot 6 Click Score: 19    End of Session Equipment Utilized During Treatment: Gait belt;Oxygen Activity Tolerance: Patient limited by pain Patient left: in chair;with call bell/phone within reach;with chair alarm set Nurse Communication: Mobility status PT Visit Diagnosis: Unsteadiness on feet (R26.81);Other abnormalities of gait and mobility (R26.89);Muscle weakness (generalized) (M62.81);Difficulty in walking, not elsewhere classified (R26.2);Pain Pain - part of body:  (low back)     Time: 5003-7048 PT Time Calculation (min) (ACUTE ONLY): 23 min  Charges:  $Therapeutic Activity: 23-37 mins                     Yan Okray B. Migdalia Dk PT, DPT Acute Rehabilitation Services Please use secure chat or  Call Office [phone]    [place] 12/10/2022, 9:15 AM

## 2022-12-10 NOTE — Progress Notes (Signed)
  X-cover Note: Bedside RN notes bilateral +3 pitting LE edema with weeping. Pt with wheezing. Stat CXR shows vascular congestion and possible right pleural effusion. RN notes pt has been receiving IV calcium gluconate and IV abx. Pt with hx of diastolic CHF. Will give 40 mg IV lasix.   Kristopher Oppenheim, DO [facility]

## 2022-12-10 NOTE — Progress Notes (Signed)
Mobility Specialist - Progress Note   12/10/22 1052  Mobility  Activity Repositioned in chair  Level of Assistance Minimal assist, patient does 75% or more  Assistive Device None  Activity Response Tolerated well  Mobility Referral Yes  $Mobility charge 1 Mobility   Pt was received in chair and needing to be repositioned. Pt was MinA for chair repositioned. Pt was left in chair with all needs met.  Franki Monte  Mobility Specialist Please contact via Solicitor or Rehab office at [phone]

## 2022-12-10 NOTE — Progress Notes (Signed)
[facility] PROGRESS NOTE  [name] (DOB: 05-27-1941) GUY:[id number] PCP: Isaac Bliss, Rayford Halsted, MD  Brief Narrative: [name] is a 82 y.o. female with a history of AML, breast cancer, paroxysmal atrial fibrillation on Eliquis, chronic diastolic heart failure, diabetes mellitus type 2, CKD stage IV. Patient presented secondary to generalized weakness and found to have evidence of UTI, hypocalcemia and acute heart failure. Patient started on calcium replacement, empiric Ceftriaxone and given Lasix. Associated hyperphosphatemia.   Subjective: Left foot pain is moderate-sever, worse with weight bearing since she dropped a far of pennies on it. She's making normal urine, eating ok, also has pain in her left hip and knee that she says hasn't been checked on.   Objective: BP (!) 121/59 (BP Location: Left Arm)   Pulse 88   Temp (!) 97.5 F (36.4 C) (Oral)   Resp 16   Ht '5\' 2"'$  (1.575 m)   Wt 76.2 kg   SpO2 94%   BMI 30.73 kg/m   Gen: Elderly chronically ill-appearing female in no distress Pulm: Clear, nonlabored  CV: RRR, no MRG, 1+ pitting dependent edema, cut tops of socks to accommodate. GI: Soft, NT, ND, +BS  Neuro: Alert and oriented. No new focal deficits. Ext: Warm, no deformities. Left great toe with dorsal partial skin avulsion with surrounding bruising, no erythema. Bunion deformity noted without palpable crepitance or fluctuance. Dorsum of foot with patchy erythema that is nontender. DP pulses not definitively palpable bilaterally. Cap refill is intact.  Skin: No other rashes, lesions or ulcers on visualized skin   Assessment & Plan: Left foot pain: Has wound on great toe after dropping a jar of pennies on it. No cellulitis is evident at this time.  - Local wound care with bactroban and keeping covered - Check ABIs due to diminished pulses.  - WBAT L foot, can use postop shoe if desired. - XR to r/o fracture. Pt reports falls at home with persistent left hip  and knee pain as well. No definitive evidence of fracture or dislocation by exam. R/o w/XR.  Acute cystitis: Urine culture significant for Klebsiella pneumoniae, resistant to ampicillin and nitrofurantoin.   - Completed ceftriaxone, symptoms resolved.    Generalized weakness: Noted on admission. Possibly related to acute illness however, possibly related to hypocalcemia.  - PT/OT recommend home health on discharge.   Acute on chronic diastolic heart failure Present on admission per history. BNP of 849.4. Patient given Lasix 20 mg IV on admission. Lasix held. No pulmonary edema.   Hypocalcemia: Patient with associated hyperphosphatemia. Likely related to renal impairment.  - Remains low (corrected to 7.2 for hypoalbuminemia). Will give IV calcium x2 today.  - Continue calcium acetate w/hyperphosphatemia.  - Vitamin D deficiency, level is < 20, supplement 50k units weekly for now.  - PTH still pending   AKI on CKD stage IV:  Baseline creatinine of about 2 with more recent creatinine of 4.33. Creatinine of 3.03 on admission and stable since that time. Patient appears overloaded, although she was hypotensive on admission. Renal ultrasound unremarkable.  - Nephrology, Dr. Carolin Sicks, recommends outpatient follow-up - Repeat metabolic panel daily   Hyperphosphatemia: Likely secondary to kidney disease. Discussed with nephrology who recommended transition to calcium acetate.  - Calcium acetate - Renal diet   Hypomagnesemia - Overall improving, will require correction to manage Ca effectively. Supplement again today.   -Repeat magnesium in AM   Metabolic acidosis: Due to renal impairment - Bicarb tabs.   Hyperlipidemia -Continue  Lipitor   T2DM: Chronically uncontrolled with hyperglycemia (HbA1c 9.9%) acute hypoglycemic on 1/1.  - Continue SSI (stopped basal and at inpatient goal) [facility] will be ordered at DC (as well as PT/OT/aide)   Paroxysmal atrial fibrillation -Continue Eliquis    History of seizures -Continue Keppra   Anemia of chronic disease Baseline hemoglobin of about 8-9. Hemoglobin of 8.1 on admission with drift down to 7.2. No evidence of hemorrhage. Hemoglobin on CBC this morning is trended down again. -Repeat H&H this afternoon -CBC in AM   AML Noted. In remission. Patient follows with Dr. [name] as an outpatient.   CAD Patient with a history of NSTEMI, treated medically. Patient is on Plavix, Lipitor, Imdur and metoprolol succinate. Antihypertensives are held on admission secondary to hypotension. -Continue Plavix  Patrecia Pour, MD [facility] [email or web address] 12/10/2022, 4:36 PM

## 2022-12-11 ENCOUNTER — Inpatient Hospital Stay (HOSPITAL_COMMUNITY): Payer: Medicare HMO

## 2022-12-11 DIAGNOSIS — I5033 Acute on chronic diastolic (congestive) heart failure: Secondary | ICD-10-CM | POA: Diagnosis not present

## 2022-12-11 DIAGNOSIS — I70229 Atherosclerosis of native arteries of extremities with rest pain, unspecified extremity: Secondary | ICD-10-CM

## 2022-12-11 DIAGNOSIS — D638 Anemia in other chronic diseases classified elsewhere: Secondary | ICD-10-CM | POA: Diagnosis not present

## 2022-12-11 DIAGNOSIS — N3000 Acute cystitis without hematuria: Secondary | ICD-10-CM | POA: Diagnosis not present

## 2022-12-11 DIAGNOSIS — L0390 Cellulitis, unspecified: Secondary | ICD-10-CM

## 2022-12-11 DIAGNOSIS — R531 Weakness: Secondary | ICD-10-CM | POA: Diagnosis not present

## 2022-12-11 LAB — CBC
HCT: 23.2 % — ABNORMAL LOW (ref 36.0–46.0)
Hemoglobin: 7.5 g/dL — ABNORMAL LOW (ref 12.0–15.0)
MCH: 31 pg (ref 26.0–34.0)
MCHC: 32.3 g/dL (ref 30.0–36.0)
MCV: 95.9 fL (ref 80.0–100.0)
Platelets: 147 10*3/uL — ABNORMAL LOW (ref 150–400)
RBC: 2.42 MIL/uL — ABNORMAL LOW (ref 3.87–5.11)
RDW: 14.3 % (ref 11.5–15.5)
WBC: 6.5 10*3/uL (ref 4.0–10.5)
nRBC: 0 % (ref 0.0–0.2)

## 2022-12-11 LAB — RENAL FUNCTION PANEL
Albumin: 2.3 g/dL — ABNORMAL LOW (ref 3.5–5.0)
Anion gap: 11 (ref 5–15)
BUN: 72 mg/dL — ABNORMAL HIGH (ref 8–23)
CO2: 22 mmol/L (ref 22–32)
Calcium: 6.5 mg/dL — ABNORMAL LOW (ref 8.9–10.3)
Chloride: 100 mmol/L (ref 98–111)
Creatinine, Ser: 3.18 mg/dL — ABNORMAL HIGH (ref 0.44–1.00)
GFR, Estimated: 14 mL/min — ABNORMAL LOW (ref >60)
Glucose, Bld: 266 mg/dL — ABNORMAL HIGH (ref 70–99)
Phosphorus: 5.9 mg/dL — ABNORMAL HIGH (ref 2.5–4.6)
Potassium: 3.5 mmol/L (ref 3.5–5.1)
Sodium: 133 mmol/L — ABNORMAL LOW (ref 135–145)

## 2022-12-11 LAB — GLUCOSE, CAPILLARY
Glucose-Capillary: 250 mg/dL — ABNORMAL HIGH (ref 70–99)
Glucose-Capillary: 156 mg/dL — ABNORMAL HIGH (ref 70–99)
Glucose-Capillary: 136 mg/dL — ABNORMAL HIGH (ref 70–99)

## 2022-12-11 LAB — MAGNESIUM: Magnesium: 2.1 mg/dL (ref 1.7–2.4)

## 2022-12-11 LAB — PARATHYROID HORMONE, INTACT (NO CA): PTH: 307 pg/mL — ABNORMAL HIGH (ref 15–65)

## 2022-12-11 MED ORDER — MEDIHONEY WOUND/BURN DRESSING EX PSTE
1.0000 | PASTE | Freq: Every day | CUTANEOUS | Status: AC
  Administered 2022-12-12 – 2022-12-24 (×13): 1 via TOPICAL
  Filled 2022-12-11 (×2): qty 44

## 2022-12-11 MED ORDER — ALBUMIN HUMAN 25 % IV SOLN
12.5000 g | Freq: Once | INTRAVENOUS | Status: AC
  Administered 2022-12-11: 12.5 g via INTRAVENOUS
  Filled 2022-12-11: qty 50

## 2022-12-11 MED ORDER — CALCIUM GLUCONATE-NACL 2-0.675 GM/100ML-% IV SOLN
2.0000 g | Freq: Two times a day (BID) | INTRAVENOUS | Status: AC
  Administered 2022-12-11 (×2): 2000 mg via INTRAVENOUS
  Filled 2022-12-11 (×2): qty 100

## 2022-12-11 MED ORDER — FUROSEMIDE 10 MG/ML IJ SOLN
40.0000 mg | Freq: Two times a day (BID) | INTRAMUSCULAR | Status: DC
  Administered 2022-12-11 – 2022-12-12 (×3): 40 mg via INTRAVENOUS
  Filled 2022-12-11 (×3): qty 4

## 2022-12-11 NOTE — Progress Notes (Signed)
OT Cancellation Note  Patient Details Name: [name] MRN: [id number] DOB: 04-Oct-1941   Cancelled Treatment:    Reason Eval/Treat Not Completed: Pain limiting ability to participate pt sitting EOB, pt reports increased pain in RLE declining session. Offered bed level ADLS with pt declining.   Harley Alto., COTA/L Acute Rehabilitation Services [phone]   Precious Haws 12/11/2022, 1:36 PM

## 2022-12-11 NOTE — Progress Notes (Signed)
[facility] PROGRESS NOTE  [name] (DOB: 20-Oct-1941) NTI:[id number] PCP: Isaac Bliss, Rayford Halsted, MD  Brief Narrative: [name] is a 82 y.o. female with a history of AML, breast cancer, paroxysmal atrial fibrillation on Eliquis, chronic diastolic heart failure, diabetes mellitus type 2, CKD stage IV. Patient presented secondary to generalized weakness and found to have evidence of UTI, hypocalcemia and acute heart failure. Patient started on calcium replacement, empiric Ceftriaxone and given Lasix. Associated hyperphosphatemia.   Subjective: Pain in feet is stable, does have some trouble getting around from swelling, but is otherwise without complaints.   Objective: BP 130/75   Pulse (!) 108   Temp 97.8 F (36.6 C) (Oral)   Resp 20   Ht '5\' 2"'$  (1.575 m)   Wt 76.9 kg   SpO2 98%   BMI 31.02 kg/m   Gen: Elderly chronically ill-appearing female in no distress Pulm: Crackles at bases, though is nonlabored laying completely flat. No wheezes  CV: RRR, 3+ pitting symmetric LE edema GI: Soft, NT, ND, +BS  Neuro: Alert and oriented. No new focal deficits. Ext: Warm, no deformities Skin: No new rashes, lesions or ulcers on visualized skin   Assessment & Plan: Left foot pain: Has wound on great toe after dropping a jar of pennies on it. No cellulitis is evident at this time.  - Local wound care per WOC - Check ABIs due to diminished pulses.  - WBAT L foot, can use postop shoe if desired. No fracture on radiographs.   Acute cystitis: Urine culture significant for Klebsiella pneumoniae, resistant to ampicillin and nitrofurantoin.   - Completed ceftriaxone, symptoms resolved.    Generalized weakness: Noted on admission. Possibly related to acute illness however, possibly related to hypocalcemia.  - PT/OT recommend home health on discharge.   Acute on chronic diastolic heart failure Present on admission per history. BNP of 849.4. Patient given Lasix 20 mg IV on admission.  Lasix held subsequently and received IV bicarbonate for a while. Overall is increasingly volume up. Weight currently 169lbs (76.9kg), up from admission when it was 155 lbs (70.7kg). EDW thought to be near 63.3kg at last CHF discharge.  - Give lasix '40mg'$  IV BID - Reinforced need for strict I/O, daily weights.  - High suspicion for third spacing related to hypoalbuminemia. Will give albumin today to facilitate diuresis.   Hypocalcemia: Patient with associated hyperphosphatemia. Likely related to renal impairment.  - Remains low though improved (corrects today to 7.9 for hypoalbuminemia). Will repeat IV calcium x2 today.  - Continue calcium acetate w/hyperphosphatemia.  - Vitamin D deficiency, level is < 20, supplement 50k units weekly started - PTH 307 consistent with hyperparathyroidism secondary to vitamin D deficiency and renal failure.   AKI on CKD stage IV:  Baseline creatinine of about 2 with more recent creatinine of 4.33. Creatinine of 3.03 on admission and stable since that time. Patient appears overloaded, although she was hypotensive on admission. Renal ultrasound unremarkable.  - Nephrology, Dr. Carolin Sicks, recommended outpatient follow-up - Repeat metabolic panel daily.    Hyperphosphatemia: Likely secondary to kidney disease. Discussed with nephrology who recommended transition to calcium acetate.  - Calcium acetate - Renal diet   Hypomagnesemia - Improved. Will continue monitoring.   Metabolic acidosis: Due to renal impairment - Improved, will continue bicarbonate tablets.   Hyperlipidemia - Continue atorvastatin   T2DM: Chronically uncontrolled with hyperglycemia (HbA1c 9.9%) acute hypoglycemic on 1/1.  - Continue SSI (stopped basal) - HHRN will be ordered at DC (  as well as PT/OT/aide)   Paroxysmal atrial fibrillation -Continue Eliquis   History of seizures - Continue Keppra   Anemia of chronic disease Baseline hemoglobin of about 8-9. Hemoglobin of 8.1 on  admission, settling in 7's without ongoing bleeding.  - Given her CAD, may transfuse if symptoms develop and hgb < 8   AML Noted. In remission. Patient follows with Dr. [name] as an outpatient.   CAD Patient with a history of NSTEMI, treated medically. Patient is on Plavix, Lipitor, Imdur and metoprolol succinate. Antihypertensives are held on admission secondary to hypotension. -Continue plavix, atorvastatin - Can restart BB, imdur if BP trends continue improving.  Patrecia Pour, MD [facility] [email or web address] 12/11/2022, 2:59 PM

## 2022-12-11 NOTE — Progress Notes (Signed)
ABI's have been completed. Preliminary results can be found in CV Proc through chart review.   12/11/22 12:01 PM [name] RVT

## 2022-12-11 NOTE — Progress Notes (Signed)
Mobility Specialist - Progress Note   12/11/22 1134  Mobility  Activity Dangled on edge of bed  Level of Assistance Moderate assist, patient does 50-74%  Assistive Device Front wheel walker  Activity Response Tolerated well  Mobility Referral Yes  $Mobility charge 1 Mobility   Pt was received in chair and agreeable to session. Pt was ModA to EOB. Session was limited d/t vascular coming in. Pt was returned to supine with all needs met.   Franki Monte  Mobility Specialist Please contact via Solicitor or Rehab office at [phone]

## 2022-12-11 NOTE — Consult Note (Signed)
WOC Nurse Consult Note: Reason for Consult: trauma to left great toe; edema to bilateral lower legs and feet.  CHF exacerbation, currently receiving IV lasix  Wound type: trauma; patient states she hit it on a jar of pennies  Pressure Injury POA: NA  Measurement: generalized edema in feet, left dorsal foot 4 cm x 4 cm erythema with weeping and L great toe anterior aspect 1 cm round non-intact lesion, dark devitalized tissue present  R dorsal foot and right 4th toe bruising from same accident.   Wound bed: 100% devitalized tissue  Drainage (amount, consistency, odor) moderate weeping to bilateral lower legs  Periwound: edema  Dressing procedure/placement/frequency: Cleanse with soap and water, pat dry.  Apply Medihoney to wound on left great toe.  Apply Xeroform to left dorsal foot.  Wrap bilateral legs with kerlix from below toes to below knee and secure with Ace wrap.     Discussed POC with patient and bedside nurse.  Re consult if needed, will not follow at this time. Thanks [employer] MSN, RN-BC, [employer]

## 2022-12-11 NOTE — Consult Note (Signed)
   [employer] CM Inpatient Consult   12/11/2022  [name] 1941/06/16 [id number]  [place] Organization [ACO] Patient: [name]  Primary Care Provider:  Isaac Bliss, Rayford Halsted, MD, [place] [facility], which is listed to provide the transition of care follow up    Patient screened for 4 hospitalizations in 6 months with noted extreme high risk score for unplanned readmission risk and  to assess for restart of potential [facility] service needs for post hospital transition for care coordination.  Review of patient's electronic medical record reveals patient is also with some coverage with worker's comp. Reviewed inpatient [facility] RNCM notes regarding ongoing follow up needs and social issues.   Plan:  Continue to follow progress and disposition to assess for post hospital community care coordination/management needs.  Referral request for community care coordination: ongoing follow up.  Of note, [facility] Care Management/Population Health does not replace or interfere with any arrangements made by the Inpatient Transition of Care team.  For questions contact:   [name], RN BSN [place]  [phone] business mobile phone Toll free office [phone]  *[place]  [phone] Fax number: [phone] [email or web address]

## 2022-12-12 DIAGNOSIS — D638 Anemia in other chronic diseases classified elsewhere: Secondary | ICD-10-CM | POA: Diagnosis not present

## 2022-12-12 DIAGNOSIS — R531 Weakness: Secondary | ICD-10-CM | POA: Diagnosis not present

## 2022-12-12 DIAGNOSIS — N3000 Acute cystitis without hematuria: Secondary | ICD-10-CM | POA: Diagnosis not present

## 2022-12-12 DIAGNOSIS — I5033 Acute on chronic diastolic (congestive) heart failure: Secondary | ICD-10-CM | POA: Diagnosis not present

## 2022-12-12 LAB — RENAL FUNCTION PANEL
Albumin: 2.3 g/dL — ABNORMAL LOW (ref 3.5–5.0)
Anion gap: 12 (ref 5–15)
BUN: 72 mg/dL — ABNORMAL HIGH (ref 8–23)
CO2: 24 mmol/L (ref 22–32)
Calcium: 6.8 mg/dL — ABNORMAL LOW (ref 8.9–10.3)
Chloride: 104 mmol/L (ref 98–111)
Creatinine, Ser: 3.07 mg/dL — ABNORMAL HIGH (ref 0.44–1.00)
GFR, Estimated: 15 mL/min — ABNORMAL LOW (ref >60)
Glucose, Bld: 258 mg/dL — ABNORMAL HIGH (ref 70–99)
Phosphorus: 5.2 mg/dL — ABNORMAL HIGH (ref 2.5–4.6)
Potassium: 3.6 mmol/L (ref 3.5–5.1)
Sodium: 140 mmol/L (ref 135–145)

## 2022-12-12 LAB — GLUCOSE, CAPILLARY
Glucose-Capillary: 206 mg/dL — ABNORMAL HIGH (ref 70–99)
Glucose-Capillary: 172 mg/dL — ABNORMAL HIGH (ref 70–99)
Glucose-Capillary: 180 mg/dL — ABNORMAL HIGH (ref 70–99)
Glucose-Capillary: 143 mg/dL — ABNORMAL HIGH (ref 70–99)

## 2022-12-12 LAB — MAGNESIUM: Magnesium: 1.9 mg/dL (ref 1.7–2.4)

## 2022-12-12 MED ORDER — FUROSEMIDE 10 MG/ML IJ SOLN
80.0000 mg | Freq: Three times a day (TID) | INTRAMUSCULAR | Status: DC
  Administered 2022-12-12 – 2022-12-16 (×10): 80 mg via INTRAVENOUS
  Filled 2022-12-12 (×10): qty 8

## 2022-12-12 NOTE — Consult Note (Signed)
[name] Admit Date: 12/06/2022 12/12/2022 Rexene Agent Requesting Physician:  Bonner Puna MD  Reason for Consult:  AoCKD3/4 HPI:  40F PMH including remote history of AML, breast cancer, uterine cancer, A-fib on apixaban, chronic HFpEF, DM2, CKD with significant lability who presented to the ED 12/30 with weakness and increased lower extremity edema.  Workup identified Klebsiella pneumonia UTI status post course of antibiotics.  Hospital course includes being treated for an acute on chronic HFpEF exacerbation.  Weights are up 7 kg from presentation.  She is currently receiving furosemide 40 mg IV twice daily.  She is on 2 L nasal cannula which has been fairly stable. Chest x-ray 2 days ago with mild vascular congestion and a right effusion.  She feels that her breathing is quite stable.  She has Unna boots on and does have significant pitting edema into the sacral area.  Serum albumin is 2.3.  Urinalyses with very little proteinuria historically.   Urine collected by pure wick.  UOP yesterday was reported at 0.5 L.  As mentioned weights are increased by as much is 7 kg from presentation.  Patient was living at home with husband prior to admission  ROS Balance of 12 systems is negative w/ exceptions as above  PMH  Past Medical History:  Diagnosis Date   Acute myelogenous leukemia ([place]) 02/2014   Acute pancreatitis    Arthritis    "back" (09/17/2017)   Atrial flutter (HCC)    Cardiomyopathy (HCC)    CHF (congestive heart failure) (HCC)    Chronic kidney disease, stage II (mild)    Chronic lower back pain    Colon polyps 04/29/2010   TUBULAR ADENOMA AND A SERRATED ADENOMA   COPD (chronic obstructive pulmonary disease) ([place])    CORONARY ARTERY DISEASE 12/24/2007   DIABETES MELLITUS, TYPE II 07/13/2007   History of blood transfusion 2015   "related to leukemia"   History of kidney stones 12/24/2007   History of uterine cancer    HYPERLIPIDEMIA 12/24/2007   HYPERTENSION 07/13/2007    NSTEMI (non-ST elevated myocardial infarction) ([place]) 09/17/2017   Uterine cancer ([place])    PSH  Past Surgical History:  Procedure Laterality Date   ABDOMINAL HYSTERECTOMY     "still have my ovaries"   BREAST BIOPSY Left 09/22/2019   BREAST LUMPECTOMY Left 10/24/2019   BREAST LUMPECTOMY WITH RADIOACTIVE SEED LOCALIZATION Left 10/24/2019   Procedure: LEFT BREAST LUMPECTOMY WITH RADIOACTIVE SEED LOCALIZATION;  Surgeon: Jovita Kussmaul, MD;  Location: [facility];  Service: General;  Laterality: Left;   CARDIAC CATHETERIZATION  2002   CARDIAC CATHETERIZATION  09/17/2017   CATARACT EXTRACTION W/ INTRAOCULAR LENS  IMPLANT, BILATERAL Bilateral    COLONOSCOPY W/ BIOPSIES AND POLYPECTOMY  2011   CORONARY STENT INTERVENTION N/A 09/21/2017   Procedure: CORONARY STENT INTERVENTION;  Surgeon: Troy Sine, MD;  Location: [facility];  Service: Cardiovascular;  Laterality: N/A;   DILATION AND CURETTAGE OF UTERUS     ESOPHAGOGASTRODUODENOSCOPY N/A 01/30/2022   Procedure: ESOPHAGOGASTRODUODENOSCOPY (EGD);  Surgeon: Milus Banister, MD;  Location: [facility];  Service: Endoscopy;  Laterality: N/A;   EUS N/A 01/30/2022   Procedure: UPPER ENDOSCOPIC ULTRASOUND (EUS) RADIAL;  Surgeon: Milus Banister, MD;  Location: [facility];  Service: Endoscopy;  Laterality: N/A;   EXCISIONAL HEMORRHOIDECTOMY  X 2   FINE NEEDLE ASPIRATION N/A 01/30/2022   Procedure: FINE NEEDLE ASPIRATION (FNA) LINEAR;  Surgeon: Milus Banister, MD;  Location: [facility];  Service: Endoscopy;  Laterality: N/A;   GANGLION CYST EXCISION Left X 3   INTRAMEDULLARY (IM) NAIL INTERTROCHANTERIC Right 08/30/2022   Procedure: INTRAMEDULLARY (IM) NAIL INTERTROCHANTERIC;  Surgeon: Altamese Pawnee, MD;  Location: [facility];  Service: Orthopedics;  Laterality: Right;   INTRAVASCULAR PRESSURE WIRE/FFR STUDY N/A 09/17/2017   Procedure: INTRAVASCULAR PRESSURE WIRE/FFR STUDY;  Surgeon: Nelva Bush, MD;  Location: [facility];  Service: Cardiovascular;  Laterality: N/A;   IR THORACENTESIS ASP PLEURAL SPACE W/IMG GUIDE  10/27/2022   NASAL SINUS SURGERY     PORTA CATH INSERTION Right 2013   RIGHT/LEFT HEART CATH AND CORONARY ANGIOGRAPHY N/A 09/17/2017   Procedure: RIGHT/LEFT HEART CATH AND CORONARY ANGIOGRAPHY;  Surgeon: Nelva Bush, MD;  Location: [facility];  Service: Cardiovascular;  Laterality: N/A;   TEE WITHOUT CARDIOVERSION N/A 11/19/2022   Procedure: TRANSESOPHAGEAL ECHOCARDIOGRAM (TEE);  Surgeon: Hebert Soho, DO;  Location: [facility];  Service: Cardiovascular;  Laterality: N/A;   TUBAL LIGATION     FH  Family History  Problem Relation Age of Onset   COPD Father    Esophageal cancer Father    Breast cancer Sister 61   Lung cancer Sister    Esophageal cancer Brother    Suicidality Brother    Lung cancer Sister    Lung cancer Sister    Cervical cancer Sister    Congestive Heart Failure Mother    Heart attack Maternal Grandmother    Skin cancer Maternal Grandfather    Heart attack Maternal Grandfather    Stroke Paternal Grandmother 42   Heart attack Paternal Grandfather    Cancer Maternal Uncle        unknown type, dx. >65   Cancer Paternal Aunt        unknown type, dx. >50   Cancer Paternal Uncle        unknown type, dx. >50   Cirrhosis Maternal Uncle    Skin cancer Daughter    Leukemia Cousin        maternal first cousin; dx. >50, in remission   Cancer Niece        unknown type behind her ear, dx. 30s/40s   Colon cancer Neg Hx    SH  reports that she quit smoking about 22 years ago. Her smoking use included cigarettes. She has a 20.00 pack-year smoking history. She has never used smokeless tobacco. She reports that she does not drink alcohol and does not use drugs. Allergies No Known Allergies Home medications Prior to Admission medications   Medication Sig Start Date End Date Taking? Authorizing Provider  clopidogrel (PLAVIX) 75 MG tablet Take 1  tablet (75 mg total) by mouth every evening. 09/30/22  Yes Angiulli, Lavon Paganini, PA-C  furosemide (LASIX) 40 MG tablet Take 40 mg by mouth daily.   Yes [provider]  HYDROcodone-acetaminophen (NORCO/VICODIN) 5-325 MG tablet Take 1 tablet by mouth every 6 (six) hours as needed for moderate pain or severe pain. 11/18/22  Yes [provider]  LORazepam (ATIVAN) 0.5 MG tablet Take 1 tablet (0.5 mg total) by mouth every 6 (six) hours as needed for anxiety. 09/30/22  Yes Angiulli, Lavon Paganini, PA-C  nitroGLYCERIN (NITROSTAT) 0.4 MG SL tablet Place 0.4 mg under the tongue every 5 (five) minutes as needed for chest pain. 11/08/22  Yes [provider]  apixaban (ELIQUIS) 2.5 MG TABS tablet Take 1 tablet (2.5 mg total) by mouth 2 (two) times daily. 09/30/22   Angiulli, Lavon Paganini, PA-C  ascorbic acid (VITAMIN C)  1000 MG tablet Take 1 tablet (1,000 mg total) by mouth daily. 09/30/22   Angiulli, Lavon Paganini, PA-C  atorvastatin (LIPITOR) 40 MG tablet Take 1 tablet (40 mg total) by mouth daily. 09/30/22   Angiulli, Lavon Paganini, PA-C  cyanocobalamin (VITAMIN B12) 1000 MCG/ML injection INJECT 1ML IN THE MUSCLE EVERY 30 DAYS AS DIRECTED Patient taking differently: Inject 1,000 mcg into the muscle every 30 (thirty) days. 08/28/22   Isaac Bliss, Rayford Halsted, MD  docusate sodium (COLACE) 100 MG capsule Take 1 capsule (100 mg total) by mouth 2 (two) times daily. 09/30/22   Angiulli, Lavon Paganini, PA-C  fluticasone furoate-vilanterol (BREO ELLIPTA) 200-25 MCG/ACT AEPB Inhale 1 puff into the lungs as needed (wheezing SOB). 09/30/22   Angiulli, Lavon Paganini, PA-C  insulin glargine, 2 Unit Dial, (TOUJEO MAX SOLOSTAR) 300 UNIT/ML Solostar Pen Inject 6 Units into the skin at bedtime. Patient taking differently: Inject 10-16 Units into the skin See admin instructions. Inject 10 units into the skin at supper and 16 units at bedtime 09/05/22   Florencia Reasons, MD  isosorbide mononitrate (IMDUR) 30 MG 24 hr tablet Take 1 tablet (30  mg total) by mouth daily. 09/30/22   Angiulli, Lavon Paganini, PA-C  letrozole [facility]) 2.5 MG tablet Take 1 tablet (2.5 mg total) by mouth daily. 02/14/22   Owens Shark, NP  levETIRAcetam (KEPPRA) 500 MG tablet TAKE 1 TABLET(500 MG) BY MOUTH TWICE DAILY 12/02/22   Raulkar, Clide Deutscher, MD  melatonin 3 MG TABS tablet Take 1 tablet (3 mg total) by mouth at bedtime. Patient taking differently: Take 3 mg by mouth at bedtime. As needed 09/30/22   Angiulli, Lavon Paganini, PA-C  methocarbamol (ROBAXIN) 500 MG tablet Take 1 tablet (500 mg total) by mouth every 4 (four) hours as needed for muscle spasms. 09/30/22   Angiulli, Lavon Paganini, PA-C  metoprolol succinate (TOPROL-XL) 25 MG 24 hr tablet Take 0.5 tablets (12.5 mg total) by mouth daily. Take with or immediately following a meal. 09/30/22   Angiulli, Lavon Paganini, PA-C  Multiple Vitamin (MULTIVITAMIN WITH MINERALS) TABS tablet Take 1 tablet by mouth daily. 09/30/22   Angiulli, Lavon Paganini, PA-C  oxyCODONE (OXY IR/ROXICODONE) 5 MG immediate release tablet Take 1 tablet (5 mg total) by mouth every 4 (four) hours as needed for moderate pain. 09/30/22   Angiulli, Lavon Paganini, PA-C  polyethylene glycol (MIRALAX / GLYCOLAX) 17 g packet Take 17 g by mouth daily. 09/30/22   Angiulli, Lavon Paganini, PA-C  potassium chloride (KLOR-CON) 10 MEQ tablet Take 10 mEq by mouth daily. 11/21/22   [provider]  psyllium (HYDROCIL/METAMUCIL) 95 % PACK Take 1 packet by mouth daily. 09/30/22   Angiulli, Lavon Paganini, PA-C    Current Medications Scheduled Meds:  apixaban  2.5 mg Oral BID   atorvastatin  40 mg Oral Daily   calcium acetate  1,334 mg Oral TID WC   Chlorhexidine Gluconate Cloth  6 each Topical Daily   clopidogrel  75 mg Oral QPM   furosemide  40 mg Intravenous BID   insulin aspart  0-9 Units Subcutaneous TID WC   leptospermum manuka honey  1 Application Topical Daily   letrozole  2.5 mg Oral Daily   levETIRAcetam  500 mg Oral BID   mupirocin ointment   Topical Daily    sodium bicarbonate  650 mg Oral TID   Vitamin D (Ergocalciferol)  50,000 Units Oral Q7 days   Continuous Infusions: PRN Meds:.acetaminophen **OR** acetaminophen, LORazepam, melatonin, naLOXone (NARCAN)  injection, oxyCODONE-acetaminophen,  polyethylene glycol  CBC Recent Labs  Lab 12/07/22 0019 12/07/22 0515 12/07/22 1823 12/09/22 0527 12/09/22 1243 12/10/22 0444 12/11/22 0447  WBC 5.0 3.9*   < > 9.8  --  7.1 6.5  NEUTROABS 3.0 2.5  --   --   --   --   --   HGB 8.1* 7.2*   < > 7.4* 7.2* 7.4* 7.5*  HCT 25.6* 23.9*   < > 23.8* 23.5* 22.8* 23.2*  MCV 97.0 99.2   < > 96.4  --  93.8 95.9  PLT 142* 124*   < > 134*  --  128* 147*   < > = values in this interval not displayed.   Basic Metabolic Panel Recent Labs  Lab 12/08/22 0250 12/08/22 0737 12/08/22 1308 12/08/22 1829 12/09/22 0527 12/09/22 1243 12/10/22 0444 12/11/22 0447 12/12/22 0450  NA 142 142  --  140 141  --  137 133* 140  K 4.4 4.5  --  4.5 3.2*  --  3.8 3.5 3.6  CL 112* 111  --  109 109  --  104 100 104  CO2 15* 17*  --  19* 20*  --  '22 22 24  '$ GLUCOSE 151* 110*  --  82 96  --  129* 266* 258*  BUN 77* 76*  --  74* 74*  --  73* 72* 72*  CREATININE 3.12* 3.00*  --  3.12* 3.02*  --  3.12* 3.18* 3.07*  CALCIUM 5.6* 5.9* 5.9* 5.6* 5.7* 5.4* 5.8* 6.5* 6.8*  PHOS 8.6* 8.3*  --  7.7* 7.3*  --  6.6* 5.9* 5.2*    Physical Exam  Blood pressure 126/83, pulse (!) 144, temperature 97.6 F (36.4 C), temperature source Oral, resp. rate 18, height '5\' 2"'$  (1.575 m), weight 77.2 kg, SpO2 100 %. GEN: Elderly, debilitated female, lying flat in bed comfortable appearing ENT: NCAT EYES: EOMI CV: Irregular, normal rate PULM: Diminished in the bases ABD: Soft, nontender SKIN: Unna boots present EXT: 2-3+ pitting edema in the posterior thighs and sacral region  Assessment 61F with advancing labile CKD, hypervolemia, chronic HFpEF with acute exacerbation, admitted with weakness and UTI.  Advanced CKD 4 with significant lability in  GFR, baseline is quite broad here Hypervolemia with acute on chronic HFpEF exacerbation, poorly diuresing Klebsiella UTI status post antibiotics Progressive frailty/debility A-fib on apixaban Hypocalcemia related to CKD BMD and vitamin D deficiency, replating, asymptomatic, improving Hyperphosphatemia on calcium acetate, improved Anemia, normocytic  Plan Increase Lasix to 80 3 times daily She has had subacute decline in functional status, consider palliative engagement for  goals of care Continue fluid restricted low-sodium diet Daily weights, Daily Renal Panel, Strict I/Os, Avoid nephrotoxins (NSAIDs, judicious IV Contrast) Will follow along   Rexene Agent  12/12/2022, 12:49 PM

## 2022-12-12 NOTE — Progress Notes (Signed)
Physical Therapy Treatment Patient Details Name: [name] MRN: [id number] DOB: [birth year] Today's Date: 12/12/2022   History of Present Illness 82 y.o. female admitted to [facility] on 12/06/2022 with suspected acute cystitis after presenting from home to [facility] ED complaining of generalized weakness and fall. PMH: AML, breast cancer, paroxysmal atrial fibrillation chronically anticoagulated on Eliquis, chronic diastolic heart failure, type 2 diabetes mellitus, CKD 4 associated baseline creatinine 1.8-3.0, anemia of chronic disease associated baseline hemoglobin 8-10,    PT Comments    Pt with good improvement today.  Reports L foot still painful but was able to tolerate some weight on foot to ambulate 15' with RW and min guard.  Pt reports spouse can provide assist and they have w/c if needed.  She required 2 L O2 and fatigued easily.  Continue PT.     Recommendations for follow up therapy are one component of a multi-disciplinary discharge planning process, led by the attending physician.  Recommendations may be updated based on patient status, additional functional criteria and insurance authorization.  Follow Up Recommendations  Home health PT     Assistance Recommended at Discharge Intermittent Supervision/Assistance  Patient can return home with the following A little help with walking and/or transfers;A little help with bathing/dressing/bathroom;Assistance with cooking/housework;Direct supervision/assist for medications management;Direct supervision/assist for financial management;Assist for transportation;Help with stairs or ramp for entrance   Equipment Recommendations  None recommended by PT    Recommendations for Other Services       Precautions / Restrictions Precautions Precautions: Fall Restrictions Weight Bearing Restrictions: Yes LLE Weight Bearing: Weight bearing as tolerated     Mobility  Bed Mobility Overal bed mobility: Needs Assistance Bed Mobility:  Rolling, Sidelying to Sit, Sit to Sidelying Rolling: Supervision Sidelying to sit: Supervision     Sit to sidelying: Supervision General bed mobility comments: Use of bed rail but no assist    Transfers Overall transfer level: Needs assistance Equipment used: Rolling walker (2 wheels) Transfers: Sit to/from Stand Sit to Stand: From elevated surface, Min guard           General transfer comment: Min guard but elevated surface; cues for hand placement    Ambulation/Gait Ambulation/Gait assistance: Min guard Gait Distance (Feet): 15 Feet Assistive device: Rolling walker (2 wheels) Gait Pattern/deviations: Step-to pattern, Decreased stride length Gait velocity: slowed     General Gait Details: ambulated short distance in room , fatigued easily but was able to tolerate weight on L foot   Stairs             Wheelchair Mobility    Modified Rankin (Stroke Patients Only)       Balance Overall balance assessment: Needs assistance Sitting-balance support: No upper extremity supported Sitting balance-Leahy Scale: Good     Standing balance support: Bilateral upper extremity supported, Reliant on assistive device for balance Standing balance-Leahy Scale: Poor Standing balance comment: Steady with RW                            Cognition Arousal/Alertness: Awake/alert Behavior During Therapy: WFL for tasks assessed/performed Overall Cognitive Status: Within Functional Limits for tasks assessed                                          Exercises General Exercises - Lower Extremity Ankle Circles/Pumps: AROM, Both, 20 reps Long  Arc Quad: AROM, Both, 20 reps Hip Flexion/Marching: AAROM, Both, 20 reps Other Exercises Other Exercises: no change in foot pain with exerciswe    General Comments General comments (skin integrity, edema, etc.): VSS on 2 L O2      Pertinent Vitals/Pain Pain Assessment Pain Assessment: 0-10 Pain Score: 6   Pain Location: L foot Pain Descriptors / Indicators: Discomfort Pain Intervention(s): Limited activity within patient's tolerance, Monitored during session    Home Living                          Prior Function            PT Goals (current goals can now be found in the care plan section) Progress towards PT goals: Progressing toward goals    Frequency    Min 3X/week      PT Plan Current plan remains appropriate    Co-evaluation              AM-PAC PT "6 Clicks" Mobility   Outcome Measure  Help needed turning from your back to your side while in a flat bed without using bedrails?: None Help needed moving from lying on your back to sitting on the side of a flat bed without using bedrails?: None Help needed moving to and from a bed to a chair (including a wheelchair)?: A Little Help needed standing up from a chair using your arms (e.g., wheelchair or bedside chair)?: A Little Help needed to walk in hospital room?: A Little Help needed climbing 3-5 steps with a railing? : A Lot 6 Click Score: 19    End of Session Equipment Utilized During Treatment: Gait belt;Oxygen Activity Tolerance: Patient tolerated treatment well Patient left: with call bell/phone within reach;in bed Nurse Communication: Mobility status PT Visit Diagnosis: Unsteadiness on feet (R26.81);Other abnormalities of gait and mobility (R26.89);Muscle weakness (generalized) (M62.81);Difficulty in walking, not elsewhere classified (R26.2);Pain     Time: 1435-1459 PT Time Calculation (min) (ACUTE ONLY): 24 min  Charges:  $Gait Training: 8-22 mins $Therapeutic Exercise: 8-22 mins                     Abran Richard, PT [facility] 12/12/2022, 3:05 PM

## 2022-12-12 NOTE — Progress Notes (Signed)
Occupational Therapy Treatment Patient Details Name: [name] MRN: [id number] DOB: 1941-03-26 Today's Date: 12/12/2022   History of present illness 82 y.o. female admitted to [facility] on 12/06/2022 with suspected acute cystitis after presenting from home to [facility] ED complaining of generalized weakness and fall. PMH: AML, breast cancer, paroxysmal atrial fibrillation chronically anticoagulated on Eliquis, chronic diastolic heart failure, type 2 diabetes mellitus, CKD 4 associated baseline creatinine 1.8-3.0, anemia of chronic disease associated baseline hemoglobin 8-10,   OT comments  Pt in bed upon therapy arrival and agreeable to participate in OT treatment. Session focused on BUE strengthening utilizing yellow resistive band supine. Pt provided with verbal instructions and visual demonstration for proper form and technique with total assist for band set-up prior to starting each exercise. Rest breaks provided during exercises while vitals were monitored. Acute OT will continue to follow patient acutely.    Recommendations for follow up therapy are one component of a multi-disciplinary discharge planning process, led by the attending physician.  Recommendations may be updated based on patient status, additional functional criteria and insurance authorization.    Follow Up Recommendations  Home health OT     Assistance Recommended at Discharge Set up Supervision/Assistance  Patient can return home with the following  A little help with walking and/or transfers;Assistance with cooking/housework;Assist for transportation;Help with stairs or ramp for entrance;Direct supervision/assist for medications management;Direct supervision/assist for financial management;A little help with bathing/dressing/bathroom   Equipment Recommendations  BSC/3in1       Precautions / Restrictions Precautions Precautions: Fall Restrictions Weight Bearing Restrictions: Yes LLE Weight Bearing: Weight  bearing as tolerated       Mobility Bed Mobility Overal bed mobility: Needs Assistance Bed Mobility: Rolling Rolling: Supervision         General bed mobility comments: Use of bed rail but no assist           ADL either performed or assessed with clinical judgement      Cognition Arousal/Alertness: Awake/alert Behavior During Therapy: WFL for tasks assessed/performed Overall Cognitive Status: Within Functional Limits for tasks assessed        Exercises General Exercises - Upper Extremity Shoulder Horizontal ABduction: Strengthening, Both, 10 reps, Supine, Theraband Theraband Level (Shoulder Horizontal Abduction): Level 1 (Yellow) Shoulder Horizontal ADduction: Strengthening, Both, 10 reps, Supine, Theraband Theraband Level (Shoulder Horizontal Adduction): Level 1 (Yellow) Elbow Flexion: Strengthening, Both, 10 reps, Supine, Theraband Theraband Level (Elbow Flexion): Level 1 (Yellow) Elbow Extension: Strengthening, Both, 10 reps, Supine, Theraband Theraband Level (Elbow Extension): Level 1 (Yellow) Other Exercises Other Exercises: BUE, shoulder chest press, supine, 10X, yellow tband       General Comments VSS on 2 L O2    Pertinent Vitals/ Pain       Pain Assessment Pain Assessment: No/denies pain Pain Score: 0-No pain         Frequency  Min 2X/week        Progress Toward Goals  OT Goals(current goals can now be found in the care plan section)  Progress towards OT goals: Progressing toward goals     Plan Discharge plan remains appropriate;Frequency remains appropriate       AM-PAC OT "6 Clicks" Daily Activity     Outcome Measure   Help from another person eating meals?: None Help from another person taking care of personal grooming?: A Little Help from another person toileting, which includes using toliet, bedpan, or urinal?: A Lot Help from another person bathing (including washing, rinsing, drying)?: A Lot Help  from another person to put  on and taking off regular upper body clothing?: A Little Help from another person to put on and taking off regular lower body clothing?: A Lot 6 Click Score: 16    End of Session Equipment Utilized During Treatment: Oxygen  OT Visit Diagnosis: Unsteadiness on feet (R26.81);Other abnormalities of gait and mobility (R26.89);Muscle weakness (generalized) (M62.81);History of falling (Z91.81)   Activity Tolerance Patient tolerated treatment well   Patient Left in bed;with call bell/phone within reach           Time: 1225-8346 OT Time Calculation (min): 13 min  Charges: OT General Charges $OT Visit: 1 Visit OT Treatments $Therapeutic Exercise: 8-22 mins  Ailene Ravel, OTR/L,CBIS  Supplemental OT - [facility] and [facility] Secure Chat Preferred    Sherelle Castelli, Clarene Duke 12/12/2022, 3:29 PM

## 2022-12-12 NOTE — Progress Notes (Signed)
[facility] PROGRESS NOTE  [name] (DOB: Aug 16, 1941) YTK:[id number] PCP: Isaac Bliss, Rayford Halsted, MD  Brief Narrative: [name] is a 82 y.o. female with a history of AML, breast cancer, paroxysmal atrial fibrillation on Eliquis, chronic diastolic heart failure, diabetes mellitus type 2, CKD stage IV. Patient presented secondary to generalized weakness and found to have evidence of UTI, hypocalcemia and acute heart failure. Patient started on calcium replacement, empiric Ceftriaxone and given Lasix. Associated hyperphosphatemia.   Subjective: Does not feel that she urinated very much yesterday, swelling unchanged, some shortness of breath.   Objective: BP 126/83 (BP Location: Left Arm)   Pulse (!) 144   Temp 97.6 F (36.4 C) (Oral)   Resp 18   Ht '5\' 2"'$  (1.575 m)   Wt 77.2 kg   SpO2 100%   BMI 31.13 kg/m   Gen: No distress, chronically ill-appearing Pulm: Diminished at bases, nonlabored  CV: Irreg irreg. Rate in 80's. Significant dependent edema to lower abdomen. GI: Soft, NT, ND, +BS  Neuro: Alert and oriented. No new focal deficits. Ext: Warm, no deformities. +Unna boots Skin: No new rashes, lesions or ulcers on visualized skin   Assessment & Plan: Left foot pain: Has wound on great toe after dropping a jar of pennies on it. No cellulitis is evident at this time. No fracture on radiographs.  - Local wound care per WOC - Continue unna boots - ABI normal on left.  - WBAT L foot, can use postop shoe if desired.   Acute cystitis: Urine culture significant for Klebsiella pneumoniae, resistant to ampicillin and nitrofurantoin.   - Completed ceftriaxone, symptoms resolved.    Generalized weakness: Noted on admission. Possibly related to acute illness however, possibly related to hypocalcemia.  - PT/OT recommend home health on discharge.   Acute on chronic diastolic heart failure Present on admission per history. BNP of 849.4. Patient given Lasix 20 mg IV on  admission. Lasix held subsequently and received IV bicarbonate for a while. Overall is increasingly volume up. Weight currently 169lbs (76.9kg), up from admission when it was 155 lbs (70.7kg). EDW thought to be near 63.3kg at last CHF discharge.  - Appreciate nephrology assistance with sluggish diuresis, augment lasix to '80mg'$  IV q8h.  - Reinforced need for strict I/O, daily weights. Weights still up despite IV lasix and UOP documented as very little, though has been incompletely charted. - High suspicion for third spacing related to hypoalbuminemia.    Hypocalcemia: Patient with associated hyperphosphatemia. Likely related to renal impairment.  - Remains low though improved (corrects today to 7.9 for hypoalbuminemia). Will repeat IV calcium x2 today.  - Continue calcium acetate w/hyperphosphatemia.  - Vitamin D deficiency, level is < 20, supplement 50k units weekly started - PTH 307 consistent with hyperparathyroidism secondary to vitamin D deficiency and renal failure.   AKI on CKD stage IV:  Baseline creatinine of about 2 with more recent creatinine of 4.33. Creatinine of 3.03 on admission and stable since that time. Patient appears overloaded, although she was hypotensive on admission. Renal ultrasound unremarkable.  - Nephrology, Dr. Carolin Sicks, recommended outpatient follow-up - Repeat metabolic panel daily.    Hyperphosphatemia: Likely secondary to kidney disease. Discussed with nephrology who recommended transition to calcium acetate.  - Calcium acetate - Renal diet   Hypomagnesemia - Improved. Will continue monitoring.   Metabolic acidosis: Due to renal impairment - Improved, will continue bicarbonate tablets.   Hyperlipidemia - Continue atorvastatin   T2DM: Chronically uncontrolled with hyperglycemia (HbA1c  9.9%) acute hypoglycemic on 1/1.  - Continue SSI (stopped basal) - HHRN will be ordered at DC (as well as PT/OT/aide)   Paroxysmal atrial fibrillation -Continue Eliquis    History of seizures - Continue Keppra   Anemia of chronic disease Baseline hemoglobin of about 8-9. Hemoglobin of 8.1 on admission, settling in 7's without ongoing bleeding.  - Given her CAD, may transfuse if symptoms develop and hgb < 8. Recheck in AM.   Abnormal right ABI: Noncompressible arteries, abnormal TBI of 0.26.  - Outpatient vascular surgery referral.   AML Noted. In remission. Patient follows with Dr. [name] as an outpatient.   CAD Patient with a history of NSTEMI, treated medically. Patient is on Plavix, Lipitor, Imdur and metoprolol succinate. Antihypertensives are held on admission secondary to hypotension. -Continue plavix, atorvastatin - Can restart BB, imdur if BP trends continue improving.  Patrecia Pour, MD [facility] [email or web address] 12/12/2022, 4:16 PM

## 2022-12-13 DIAGNOSIS — R531 Weakness: Secondary | ICD-10-CM | POA: Diagnosis not present

## 2022-12-13 DIAGNOSIS — N3000 Acute cystitis without hematuria: Secondary | ICD-10-CM | POA: Diagnosis not present

## 2022-12-13 DIAGNOSIS — D638 Anemia in other chronic diseases classified elsewhere: Secondary | ICD-10-CM | POA: Diagnosis not present

## 2022-12-13 DIAGNOSIS — I5033 Acute on chronic diastolic (congestive) heart failure: Secondary | ICD-10-CM | POA: Diagnosis not present

## 2022-12-13 LAB — RENAL FUNCTION PANEL
Albumin: 2.2 g/dL — ABNORMAL LOW (ref 3.5–5.0)
Anion gap: 12 (ref 5–15)
BUN: 69 mg/dL — ABNORMAL HIGH (ref 8–23)
CO2: 22 mmol/L (ref 22–32)
Calcium: 6.6 mg/dL — ABNORMAL LOW (ref 8.9–10.3)
Chloride: 107 mmol/L (ref 98–111)
Creatinine, Ser: 2.87 mg/dL — ABNORMAL HIGH (ref 0.44–1.00)
GFR, Estimated: 16 mL/min — ABNORMAL LOW (ref >60)
Glucose, Bld: 198 mg/dL — ABNORMAL HIGH (ref 70–99)
Phosphorus: 4.8 mg/dL — ABNORMAL HIGH (ref 2.5–4.6)
Potassium: 3.5 mmol/L (ref 3.5–5.1)
Sodium: 141 mmol/L (ref 135–145)

## 2022-12-13 LAB — PREPARE RBC (CROSSMATCH)

## 2022-12-13 LAB — HEMOGLOBIN AND HEMATOCRIT, BLOOD
HCT: 26.1 % — ABNORMAL LOW (ref 36.0–46.0)
Hemoglobin: 8.6 g/dL — ABNORMAL LOW (ref 12.0–15.0)

## 2022-12-13 LAB — CBC
HCT: 21.6 % — ABNORMAL LOW (ref 36.0–46.0)
Hemoglobin: 6.9 g/dL — CL (ref 12.0–15.0)
MCH: 30.5 pg (ref 26.0–34.0)
MCHC: 31.9 g/dL (ref 30.0–36.0)
MCV: 95.6 fL (ref 80.0–100.0)
Platelets: 108 10*3/uL — ABNORMAL LOW (ref 150–400)
RBC: 2.26 MIL/uL — ABNORMAL LOW (ref 3.87–5.11)
RDW: 14 % (ref 11.5–15.5)
WBC: 4.1 10*3/uL (ref 4.0–10.5)
nRBC: 0 % (ref 0.0–0.2)

## 2022-12-13 LAB — MAGNESIUM: Magnesium: 1.8 mg/dL (ref 1.7–2.4)

## 2022-12-13 LAB — GLUCOSE, CAPILLARY
Glucose-Capillary: 209 mg/dL — ABNORMAL HIGH (ref 70–99)
Glucose-Capillary: 178 mg/dL — ABNORMAL HIGH (ref 70–99)
Glucose-Capillary: 103 mg/dL — ABNORMAL HIGH (ref 70–99)
Glucose-Capillary: 260 mg/dL — ABNORMAL HIGH (ref 70–99)

## 2022-12-13 MED ORDER — SENNOSIDES-DOCUSATE SODIUM 8.6-50 MG PO TABS
1.0000 | ORAL_TABLET | Freq: Every day | ORAL | Status: DC
  Administered 2022-12-13 – 2022-12-14 (×2): 1 via ORAL
  Filled 2022-12-13 (×2): qty 1

## 2022-12-13 MED ORDER — SORBITOL 70 % SOLN
30.0000 mL | Freq: Every day | Status: DC | PRN
  Administered 2022-12-14 – 2022-12-18 (×2): 30 mL via ORAL
  Filled 2022-12-13 (×3): qty 30

## 2022-12-13 MED ORDER — SODIUM CHLORIDE 0.9% IV SOLUTION: Freq: Once | INTRAVENOUS | Status: AC

## 2022-12-13 NOTE — Progress Notes (Signed)
Admit: 12/06/2022 LOS: 53  46F with advancing labile CKD, hypervolemia, chronic HFpEF with acute exacerbation, admitted with weakness and UTI.   Subjective:  Patient feels better this morning Creatinine slightly improved, K3.5 Hemoglobin 6.9, to receive PRBC Remains on 2 L nasal cannula UOP only documented 0.6 L, weights are unchanged  01/05 0701 - 01/06 0700 In: -  Out: 600 [Urine:600]  Filed Weights   12/11/22 0455 12/12/22 0534 12/13/22 0339  Weight: 76.9 kg 77.2 kg 78.1 kg    Scheduled Meds:  sodium chloride   Intravenous Once   apixaban  2.5 mg Oral BID   atorvastatin  40 mg Oral Daily   calcium acetate  1,334 mg Oral TID WC   Chlorhexidine Gluconate Cloth  6 each Topical Daily   clopidogrel  75 mg Oral QPM   furosemide  80 mg Intravenous Q8H   insulin aspart  0-9 Units Subcutaneous TID WC   leptospermum manuka honey  1 Application Topical Daily   letrozole  2.5 mg Oral Daily   levETIRAcetam  500 mg Oral BID   mupirocin ointment   Topical Daily   sodium bicarbonate  650 mg Oral TID   Vitamin D (Ergocalciferol)  50,000 Units Oral Q7 days   Continuous Infusions: PRN Meds:.acetaminophen **OR** acetaminophen, LORazepam, melatonin, naLOXone (NARCAN)  injection, oxyCODONE-acetaminophen, polyethylene glycol  Current Labs: reviewed   Physical Exam:  Blood pressure 109/76, pulse 95, temperature 98 F (36.7 C), temperature source Oral, resp. rate 20, height '5\' 2"'$  (1.575 m), weight 78.1 kg, SpO2 100 %. GEN: Elderly, debilitated female, lying flat in bed comfortable appearing ENT: NCAT EYES: EOMI CV: Irregular, normal rate PULM: Diminished in the bases ABD: Soft, nontender SKIN: Unna boots present EXT: 2-3+ pitting edema in the posterior thighs and sacral region  A Advanced CKD 4 with significant lability in GFR, baseline is quite broad here Hypervolemia with acute on chronic HFpEF exacerbation, poorly diuresing Klebsiella UTI status post antibiotics Progressive  frailty/debility A-fib on apixaban Hypocalcemia related to CKD BMD and vitamin D deficiency, replating, asymptomatic, improving Hyperphosphatemia on calcium acetate, improved Anemia, normocytic  P Remains hypervolemic, continue Lasix Daily weights, Daily Renal Panel, Strict I/Os, Avoid nephrotoxins (NSAIDs, judicious IV Contrast)  Medication Issues; Preferred narcotic agents for pain control are hydromorphone, fentanyl, and methadone. Morphine should not be used.  Baclofen should be avoided Avoid oral sodium phosphate and magnesium citrate based laxatives / bowel preps    Pearson Grippe MD 12/13/2022, 10:39 AM  Recent Labs  Lab 12/11/22 0447 12/12/22 0450 12/13/22 0500  NA 133* 140 141  K 3.5 3.6 3.5  CL 100 104 107  CO2 '22 24 22  '$ GLUCOSE 266* 258* 198*  BUN 72* 72* 69*  CREATININE 3.18* 3.07* 2.87*  CALCIUM 6.5* 6.8* 6.6*  PHOS 5.9* 5.2* 4.8*   Recent Labs  Lab 12/07/22 0019 12/07/22 0515 12/07/22 1823 12/10/22 0444 12/11/22 0447 12/13/22 0500  WBC 5.0 3.9*   < > 7.1 6.5 4.1  NEUTROABS 3.0 2.5  --   --   --   --   HGB 8.1* 7.2*   < > 7.4* 7.5* 6.9*  HCT 25.6* 23.9*   < > 22.8* 23.2* 21.6*  MCV 97.0 99.2   < > 93.8 95.9 95.6  PLT 142* 124*   < > 128* 147* 108*   < > = values in this interval not displayed.

## 2022-12-13 NOTE — Progress Notes (Signed)
[facility] PROGRESS NOTE  MANAR SMALLING (DOB: 03/18/1941) OIZ:[id number] PCP: Isaac Bliss, Rayford Halsted, MD  Brief Narrative: [name] is a 82 y.o. female with a history of AML, breast cancer, paroxysmal atrial fibrillation on Eliquis, chronic diastolic heart failure, diabetes mellitus type 2, CKD stage IV. Patient presented secondary to generalized weakness and found to have evidence of UTI, hypocalcemia and acute heart failure. Patient started on calcium replacement, empiric Ceftriaxone and given Lasix. Associated hyperphosphatemia.   Subjective: No bleeding, doesn't think she's had any increase in urination over past 24 hours, maybe a bit more this morning. No chest pain. Stable dyspnea and swelling. Feels constipated.  Objective: BP (!) 129/58   Pulse 77   Temp (!) 97.4 F (36.3 C) (Oral)   Resp 18   Ht '5\' 2"'$  (1.575 m)   Wt 78.1 kg   SpO2 93%   BMI 31.49 kg/m   Gen: Chronically ill-appearing female in no distress Pulm: Diminished throughout without crackles  CV: Irreg irreg with rate in 70-80's GI: Soft, NT, ND, +BS  Neuro: Alert and oriented. No new focal deficits. Ext: Warm, no deformities Skin: No new rashes, lesions or ulcers on visualized skin   Assessment & Plan: Left foot pain: Has wound on great toe after dropping a jar of pennies on it. No cellulitis is evident at this time. No fracture on radiographs.  - Local wound care per WOC - Continue unna boots - ABI normal on left.  - WBAT L foot, can use postop shoe if desired.   Acute cystitis: Urine culture significant for Klebsiella pneumoniae, resistant to ampicillin and nitrofurantoin.   - Completed ceftriaxone, symptoms resolved.    Generalized weakness: Noted on admission. Possibly related to acute illness however, possibly related to hypocalcemia.  - PT/OT recommend home health on discharge.   Acute on chronic diastolic heart failure Present on admission per history. BNP of 849.4. Patient given  Lasix 20 mg IV on admission. Lasix held subsequently and received IV bicarbonate for a while. Overall is increasingly volume up. Weight currently 169lbs (76.9kg), up from admission when it was 155 lbs (70.7kg). EDW thought to be near 63.3kg at last CHF discharge.  - Appreciate nephrology assistance with sluggish diuresis. Reinforced need for strict I/O, daily weights. Weights and UOP stubborn, continue aggressive IV diuresis. - High suspicion for third spacing related to hypoalbuminemia.    Hypocalcemia: Patient with associated hyperphosphatemia. Likely related to renal impairment.  - Remains low, though overall improved and not severe nor symptomatic. - Continue calcium acetate w/hyperphosphatemia.  - Vitamin D deficiency, level is < 20, supplement 50k units weekly started - PTH 307 consistent with hyperparathyroidism/MBD secondary to vitamin D deficiency and renal failure.   AKI on CKD stage IV:  Baseline creatinine of about 2 with more recent creatinine of 4.33. Creatinine of 3.03 on admission and stable since that time. Patient appears overloaded, although she was hypotensive on admission. Renal ultrasound unremarkable.  - Appreciate nephrology assistance.  - Repeat metabolic panel daily.   Anemia of CKD: Baseline hemoglobin of about 8-9. Hemoglobin of 8.1 on admission, down to 6.9g/dl.  - Given her CAD, certainly meets transfusion criteria, will give 1u PRBCs, consented pt.  - Recheck in AM.  Hyperphosphatemia: Likely secondary to kidney disease. Discussed with nephrology who recommended transition to calcium acetate.  - Calcium acetate - Renal diet   Hypomagnesemia - Improved. Will continue monitoring.   Metabolic acidosis: Due to renal impairment - Improved, will continue bicarbonate  tablets.   Constipation:  - Senokot daily, sorbitol prn  Hyperlipidemia - Continue atorvastatin   T2DM: Chronically uncontrolled with hyperglycemia (HbA1c 9.9%) acute hypoglycemic on 1/1.  -  Continue SSI (stopped basal) - HHRN will be ordered at DC (as well as PT/OT/aide)   Paroxysmal atrial fibrillation -Continue Eliquis   History of seizures - Continue Keppra      Abnormal right ABI: Noncompressible arteries, abnormal TBI of 0.26.  - Outpatient vascular surgery referral.   AML Noted. In remission. Patient follows with Dr. [name] as an outpatient.   CAD Patient with a history of NSTEMI, treated medically. Patient is on Plavix, Lipitor, Imdur and metoprolol succinate. Antihypertensives are held on admission secondary to hypotension. -Continue plavix, atorvastatin - Can restart BB, imdur if BP trends continue improving.  Patrecia Pour, MD [facility] [email or web address] 12/13/2022, 4:38 PM

## 2022-12-13 NOTE — Progress Notes (Signed)
Pt's Hgb 6.9; attempted to notify Bridgett Larsson, MD. Awaiting further orders.   Elaina Hoops, RN

## 2022-12-13 NOTE — Plan of Care (Signed)
  Problem: Education: Goal: Knowledge of General Education information will improve Description: Including pain rating scale, medication(s)/side effects and non-pharmacologic comfort measures Outcome: Progressing   Problem: Health Behavior/Discharge Planning: Goal: Ability to manage health-related needs will improve Outcome: Progressing   Problem: Clinical Measurements: Goal: Respiratory complications will improve Outcome: Progressing   Problem: Clinical Measurements: Goal: Cardiovascular complication will be avoided Outcome: Progressing   Problem: Pain Managment: Goal: General experience of comfort will improve Outcome: Progressing   Problem: Skin Integrity: Goal: Risk for impaired skin integrity will decrease Outcome: Progressing

## 2022-12-14 DIAGNOSIS — N3000 Acute cystitis without hematuria: Secondary | ICD-10-CM | POA: Diagnosis not present

## 2022-12-14 DIAGNOSIS — D638 Anemia in other chronic diseases classified elsewhere: Secondary | ICD-10-CM | POA: Diagnosis not present

## 2022-12-14 DIAGNOSIS — R531 Weakness: Secondary | ICD-10-CM | POA: Diagnosis not present

## 2022-12-14 DIAGNOSIS — I5033 Acute on chronic diastolic (congestive) heart failure: Secondary | ICD-10-CM | POA: Diagnosis not present

## 2022-12-14 LAB — CBC
HCT: 27 % — ABNORMAL LOW (ref 36.0–46.0)
Hemoglobin: 8.5 g/dL — ABNORMAL LOW (ref 12.0–15.0)
MCH: 29.8 pg (ref 26.0–34.0)
MCHC: 31.5 g/dL (ref 30.0–36.0)
MCV: 94.7 fL (ref 80.0–100.0)
Platelets: 155 10*3/uL (ref 150–400)
RBC: 2.85 MIL/uL — ABNORMAL LOW (ref 3.87–5.11)
RDW: 15.3 % (ref 11.5–15.5)
WBC: 4.8 10*3/uL (ref 4.0–10.5)
nRBC: 0 % (ref 0.0–0.2)

## 2022-12-14 LAB — RENAL FUNCTION PANEL
Albumin: 2.3 g/dL — ABNORMAL LOW (ref 3.5–5.0)
Anion gap: 10 (ref 5–15)
BUN: 69 mg/dL — ABNORMAL HIGH (ref 8–23)
CO2: 25 mmol/L (ref 22–32)
Calcium: 6.7 mg/dL — ABNORMAL LOW (ref 8.9–10.3)
Chloride: 104 mmol/L (ref 98–111)
Creatinine, Ser: 2.85 mg/dL — ABNORMAL HIGH (ref 0.44–1.00)
GFR, Estimated: 16 mL/min — ABNORMAL LOW (ref >60)
Glucose, Bld: 177 mg/dL — ABNORMAL HIGH (ref 70–99)
Phosphorus: 4.5 mg/dL (ref 2.5–4.6)
Potassium: 3.5 mmol/L (ref 3.5–5.1)
Sodium: 139 mmol/L (ref 135–145)

## 2022-12-14 LAB — TYPE AND SCREEN
ABO/RH(D): O NEG
Antibody Screen: NEGATIVE
Unit division: 0

## 2022-12-14 LAB — BPAM RBC
Blood Product Expiration Date: 202401122359
ISSUE DATE / TIME: 202401061235
Unit Type and Rh: 9500

## 2022-12-14 LAB — GLUCOSE, CAPILLARY
Glucose-Capillary: 168 mg/dL — ABNORMAL HIGH (ref 70–99)
Glucose-Capillary: 226 mg/dL — ABNORMAL HIGH (ref 70–99)
Glucose-Capillary: 91 mg/dL (ref 70–99)
Glucose-Capillary: 204 mg/dL — ABNORMAL HIGH (ref 70–99)

## 2022-12-14 LAB — MAGNESIUM: Magnesium: 1.8 mg/dL (ref 1.7–2.4)

## 2022-12-14 MED ORDER — SENNOSIDES-DOCUSATE SODIUM 8.6-50 MG PO TABS
1.0000 | ORAL_TABLET | Freq: Two times a day (BID) | ORAL | Status: DC
  Administered 2022-12-14 – 2022-12-26 (×20): 1 via ORAL
  Filled 2022-12-14 (×21): qty 1

## 2022-12-14 NOTE — Progress Notes (Signed)
Mobility Specialist - Progress Note   12/14/22 1300  Mobility  Activity Dangled on edge of bed  Level of Assistance Minimal assist, patient does 75% or more  Assistive Device Other (Comment) (Bed rails)  Activity Response Tolerated well  Mobility Referral Yes  $Mobility charge 1 Mobility    Pt received in bed, deferred OOB mobility but agreeable to sit up EOB. MinA to move BLE to floor. Left sitting EOB w/ call bell within reach.   [place] Specialist Please contact via SecureChat or Rehab office at [phone]

## 2022-12-14 NOTE — Progress Notes (Signed)
[facility] PROGRESS NOTE  [name] (DOB: [birth year]) LHT:[id number] PCP: Isaac Bliss, Rayford Halsted, MD  Brief Narrative: [name] is a 82 y.o. female with a history of AML, breast cancer, paroxysmal atrial fibrillation on Eliquis, chronic diastolic heart failure, diabetes mellitus type 2, CKD stage IV. Patient presented secondary to generalized weakness and found to have evidence of UTI, hypocalcemia and acute heart failure. Patient started on calcium replacement, empiric Ceftriaxone and given Lasix. Associated hyperphosphatemia.   Subjective: Feels about the same but urinating more. No chest pains or dyspnea at rest.   Objective: BP (!) 137/55 (BP Location: Left Arm)   Pulse 93   Temp 98 F (36.7 C) (Oral)   Resp 17   Ht '5\' 2"'$  (1.575 m)   Wt 75.7 kg   SpO2 100%   BMI 30.53 kg/m   Gen: Frail, pleasant female in no distress Pulm: Nonlabored, clear  CV: +JVD and pitting dependent edema. RRR, no MRG GI: Soft, NT, ND, +BS  Neuro: Alert and oriented. No new focal deficits. Ext: Warm, no deformities Skin: No new rashes, lesions or ulcers on visualized skin   Assessment & Plan: Left foot pain: Has wound on great toe after dropping a jar of pennies on it. No cellulitis is evident at this time. No fracture on radiographs.  - Local wound care per WOC - Continue unna boots - ABI normal on left.  - WBAT L foot, can use postop shoe if desired.   Acute cystitis: Urine culture significant for Klebsiella pneumoniae, resistant to ampicillin and nitrofurantoin.   - Completed ceftriaxone, symptoms resolved.    Generalized weakness: Noted on admission. Possibly related to acute illness however, possibly related to hypocalcemia.  - PT/OT recommend home health on discharge.   Acute on chronic diastolic heart failure Present on admission per history. BNP of 849.4. Patient given Lasix 20 mg IV on admission. Lasix held subsequently and received IV bicarbonate for a while. Overall  is increasingly volume up. Weight currently 169lbs (76.9kg), up from admission when it was 155 lbs (70.7kg). EDW thought to be near 63.3kg at last CHF discharge.  - Appreciate nephrology assistance with diuresis. UOP picking up with stable Cr, will monitor another day. Perhaps increase dose/add metolazone. - Continue strict I/O, daily weights.  - High suspicion for third spacing related to hypoalbuminemia.    Hypocalcemia: Patient with associated hyperphosphatemia. Likely related to renal impairment.  - Remains low, though overall improved and not severe nor symptomatic. - Continue calcium acetate w/hyperphosphatemia.  - Vitamin D deficiency, level is < 20, supplement 50k units weekly started - PTH 307 consistent with hyperparathyroidism/MBD secondary to vitamin D deficiency and renal failure.   AKI on CKD stage IV: Baseline creatinine of about 2 with more recent creatinine of 4.33. Creatinine of 3.03 on admission and stable since that time. Patient appears overloaded, although she was hypotensive on admission. Renal ultrasound unremarkable.  - Appreciate nephrology assistance.  - Repeat metabolic panel daily.   Anemia of CKD: s/p 1u PRBC 1/6.  - Monitor hgb, up as anticipated.  Hyperphosphatemia: Likely secondary to kidney disease. Discussed with nephrology who recommended transition to calcium acetate.  - Calcium acetate - Renal diet   Hypomagnesemia - Improved. Will continue monitoring.   Metabolic acidosis: Due to renal impairment - Improved, will continue bicarbonate tablets.   Constipation:  - Senokot daily, sorbitol prn  Hyperlipidemia - Continue atorvastatin   T2DM: Chronically uncontrolled with hyperglycemia (HbA1c 9.9%) acutely hypoglycemic on 1/1.  -  Continue SSI (stopped basal) - HHRN will be ordered at DC (as well as PT/OT/aide)   Paroxysmal atrial fibrillation -Continue Eliquis   History of seizures - Continue Keppra    Abnormal right ABI: Noncompressible  arteries, abnormal TBI of 0.26.  - Outpatient vascular surgery referral.   AML: In remission. Patient follows with Dr. [name] as an outpatient.   CAD: Patient with a history of NSTEMI, treated medically.  - Continue plavix, atorvastatin - Can restart BB, imdur if BP trends continue improving.  Patrecia Pour, MD [facility] [email or web address] 12/14/2022, 3:59 PM

## 2022-12-14 NOTE — Progress Notes (Signed)
Admit: 12/06/2022 LOS: 7  35F with advancing labile CKD, hypervolemia, chronic HFpEF with acute exacerbation, admitted with weakness and UTI.   Subjective:  No Interval events Urine output picked up yesterday to 1.8 L Creatinine and BUN unchanged, K3.5 Patient feels better this morning, struggling with constipation Hemoglobin improved to 8.5 after transfusion yesterday  Remains on 2 L nasal cannula  01/06 0701 - 01/07 0700 In: 1171 [P.O.:837; Blood:334] Out: 1800 [Urine:1800]  Filed Weights   12/12/22 0534 12/13/22 0339 12/14/22 0659  Weight: 77.2 kg 78.1 kg 75.7 kg    Scheduled Meds:  apixaban  2.5 mg Oral BID   atorvastatin  40 mg Oral Daily   calcium acetate  1,334 mg Oral TID WC   Chlorhexidine Gluconate Cloth  6 each Topical Daily   clopidogrel  75 mg Oral QPM   furosemide  80 mg Intravenous Q8H   insulin aspart  0-9 Units Subcutaneous TID WC   leptospermum manuka honey  1 Application Topical Daily   letrozole  2.5 mg Oral Daily   levETIRAcetam  500 mg Oral BID   mupirocin ointment   Topical Daily   senna-docusate  1 tablet Oral Daily   sodium bicarbonate  650 mg Oral TID   Vitamin D (Ergocalciferol)  50,000 Units Oral Q7 days   Continuous Infusions: PRN Meds:.acetaminophen **OR** acetaminophen, LORazepam, melatonin, naLOXone (NARCAN)  injection, oxyCODONE-acetaminophen, polyethylene glycol, sorbitol  Current Labs: reviewed   Physical Exam:  Blood pressure (!) 140/66, pulse 93, temperature 97.8 F (36.6 C), temperature source Oral, resp. rate 20, height '5\' 2"'$  (1.575 m), weight 75.7 kg, SpO2 100 %. GEN: Elderly, debilitated female, lying flat in bed comfortable appearing ENT: NCAT EYES: EOMI CV: Irregular, normal rate PULM: Diminished in the bases ABD: Soft, nontender SKIN: Unna boots present EXT: 2-3+ pitting edema in the posterior thighs and sacral region  A Advanced CKD 4 with significant lability in GFR, baseline is quite broad here Hypervolemia with  acute on chronic HFpEF exacerbation, marginally diuresing Klebsiella UTI status post antibiotics Progressive frailty/debility A-fib on apixaban Hypocalcemia related to CKD BMD and vitamin D deficiency, replating, asymptomatic, improving Hyperphosphatemia on calcium acetate, improved Anemia, normocytic  P Remains hypervolemic, continue Lasix Daily weights, Daily Renal Panel, Strict I/Os, Avoid nephrotoxins (NSAIDs, judicious IV Contrast)  Medication Issues; Preferred narcotic agents for pain control are hydromorphone, fentanyl, and methadone. Morphine should not be used.  Baclofen should be avoided Avoid oral sodium phosphate and magnesium citrate based laxatives / bowel preps    Pearson Grippe MD 12/14/2022, 10:10 AM  Recent Labs  Lab 12/12/22 0450 12/13/22 0500 12/14/22 0447  NA 140 141 139  K 3.6 3.5 3.5  CL 104 107 104  CO2 '24 22 25  '$ GLUCOSE 258* 198* 177*  BUN 72* 69* 69*  CREATININE 3.07* 2.87* 2.85*  CALCIUM 6.8* 6.6* 6.7*  PHOS 5.2* 4.8* 4.5    Recent Labs  Lab 12/11/22 0447 12/13/22 0500 12/13/22 2000 12/14/22 0447  WBC 6.5 4.1  --  4.8  HGB 7.5* 6.9* 8.6* 8.5*  HCT 23.2* 21.6* 26.1* 27.0*  MCV 95.9 95.6  --  94.7  PLT 147* 108*  --  155

## 2022-12-15 ENCOUNTER — Encounter (HOSPITAL_COMMUNITY): Payer: Medicare HMO | Admitting: Cardiology

## 2022-12-15 DIAGNOSIS — D638 Anemia in other chronic diseases classified elsewhere: Secondary | ICD-10-CM | POA: Diagnosis not present

## 2022-12-15 DIAGNOSIS — I5033 Acute on chronic diastolic (congestive) heart failure: Secondary | ICD-10-CM | POA: Diagnosis not present

## 2022-12-15 DIAGNOSIS — R531 Weakness: Secondary | ICD-10-CM | POA: Diagnosis not present

## 2022-12-15 DIAGNOSIS — N3000 Acute cystitis without hematuria: Secondary | ICD-10-CM | POA: Diagnosis not present

## 2022-12-15 LAB — RENAL FUNCTION PANEL
Albumin: 2.2 g/dL — ABNORMAL LOW (ref 3.5–5.0)
Anion gap: 10 (ref 5–15)
BUN: 69 mg/dL — ABNORMAL HIGH (ref 8–23)
CO2: 27 mmol/L (ref 22–32)
Calcium: 6.5 mg/dL — ABNORMAL LOW (ref 8.9–10.3)
Chloride: 102 mmol/L (ref 98–111)
Creatinine, Ser: 2.8 mg/dL — ABNORMAL HIGH (ref 0.44–1.00)
GFR, Estimated: 17 mL/min — ABNORMAL LOW (ref >60)
Glucose, Bld: 176 mg/dL — ABNORMAL HIGH (ref 70–99)
Phosphorus: 4.2 mg/dL (ref 2.5–4.6)
Potassium: 3.4 mmol/L — ABNORMAL LOW (ref 3.5–5.1)
Sodium: 139 mmol/L (ref 135–145)

## 2022-12-15 LAB — GLUCOSE, CAPILLARY
Glucose-Capillary: 175 mg/dL — ABNORMAL HIGH (ref 70–99)
Glucose-Capillary: 171 mg/dL — ABNORMAL HIGH (ref 70–99)
Glucose-Capillary: 136 mg/dL — ABNORMAL HIGH (ref 70–99)
Glucose-Capillary: 167 mg/dL — ABNORMAL HIGH (ref 70–99)

## 2022-12-15 LAB — MAGNESIUM: Magnesium: 1.8 mg/dL (ref 1.7–2.4)

## 2022-12-15 MED ORDER — POTASSIUM CHLORIDE CRYS ER 10 MEQ PO TBCR
10.0000 meq | EXTENDED_RELEASE_TABLET | Freq: Two times a day (BID) | ORAL | Status: AC
  Administered 2022-12-15 – 2022-12-16 (×3): 10 meq via ORAL
  Filled 2022-12-15 (×3): qty 1

## 2022-12-15 MED ORDER — MAGNESIUM SULFATE 2 GM/50ML IV SOLN
2.0000 g | Freq: Once | INTRAVENOUS | Status: AC
  Administered 2022-12-15: 2 g via INTRAVENOUS
  Filled 2022-12-15: qty 50

## 2022-12-15 MED ORDER — CALCIUM GLUCONATE-NACL 1-0.675 GM/50ML-% IV SOLN
1.0000 g | Freq: Once | INTRAVENOUS | Status: AC
  Administered 2022-12-15: 1000 mg via INTRAVENOUS
  Filled 2022-12-15: qty 50

## 2022-12-15 NOTE — Progress Notes (Addendum)
OT Cancellation Note  Patient Details Name: BEDELIA PONG MRN: [id number] DOB: 03/01/1941   Cancelled Treatment:    Reason Eval/Treat Not Completed: Other (comment) (pt getting soap suds enema per RN, will reattempt for OT tx as schedule permits)  5427: Reattempted, pt declining therapy today, will follow up tomorrow as schedule permits  Renaye Rakers, OTD, OTR/L SecureChat Preferred Acute Rehab [phone] - [place] 12/15/2022, 11:16 AM

## 2022-12-15 NOTE — Progress Notes (Signed)
[facility] PROGRESS NOTE  [name] (DOB: [birth year]) HWE:[id number] PCP: [name], Catherine Halsted, MD  Brief Narrative: [name] is a 82 y.o. female with a history of AML, breast cancer, paroxysmal atrial fibrillation on Eliquis, chronic diastolic heart failure, diabetes mellitus type 2, CKD stage IV. Patient presented secondary to generalized weakness and found to have evidence of UTI, hypocalcemia and acute heart failure. Patient started on calcium replacement, empiric Ceftriaxone and given Lasix. Associated hyperphosphatemia.   Subjective: No new complaints, feels unchanged from yesterday with some pain in her feet when bearing weight. No chest pain. Does have dyspnea which she believes is stable. Swelling remains significant  Objective: BP 133/62 (BP Location: Left Arm)   Pulse 92   Temp (!) 97.5 F (36.4 C) (Oral)   Resp 18   Ht '5\' 2"'$  (1.575 m)   Wt 75.8 kg   SpO2 100%   BMI 30.58 kg/m   Gen: Elderly frail female in no distress Pulm: Nonlabored, some crackles, diminished  CV: RRR, edema in LE's 2+ including water blisters on toes (limited visualization due to unna boots) GI: Soft, NT, ND, +BS  Neuro: Alert and oriented. No new focal deficits. Ext: Warm, no deformities Skin: No rashes, lesions or ulcers on visualized skin   Assessment & Plan: Left foot pain: Has wound on great toe after dropping a jar of pennies on it. No cellulitis is evident at this time. No fracture on radiographs. Toe is dusky but warm.  - Local wound care per WOC - Continue unna boots - ABI normal on left.  - WBAT L foot, can use postop shoe if desired.   Acute cystitis: Urine culture significant for Klebsiella pneumoniae, resistant to ampicillin and nitrofurantoin.   - Completed ceftriaxone, symptoms resolved.    Generalized weakness: Noted on admission. Possibly related to acute illness however, possibly related to hypocalcemia.  - PT/OT recommend home health on discharge.    Acute on chronic diastolic heart failure Present on admission per history. BNP of 849.4. Patient given Lasix 20 mg IV on admission. Lasix held subsequently and received IV bicarbonate for a while. Overall is increasingly volume up. Weight currently 169lbs (76.9kg), up from admission when it was 155 lbs (70.7kg). EDW thought to be near 63.3kg at last CHF discharge.  - Appreciate nephrology assistance with diuresis. Still sluggish with stable Cr, will monitor another day on 80IV TID. Perhaps increase dose/add metolazone. - Continue strict I/O, daily weights.  - High suspicion for third spacing related to hypoalbuminemia.  - With difficulty diuresing and baseline significant frailty, DNR status is confirmed but ongoing goals of care discussions would be helpful. Will consult palliative care whose assistance is appreciated.   Hypocalcemia: Patient with associated hyperphosphatemia. Likely related to renal impairment.  - Remains low, though overall improved and not severe nor symptomatic. - Continue calcium acetate w/hyperphosphatemia.  - Vitamin D deficiency, level is < 20, supplement 50k units weekly started - PTH 307 consistent with hyperparathyroidism/MBD secondary to vitamin D deficiency and renal failure.   AKI on CKD stage IV: Baseline creatinine of about 2 with more recent creatinine of 4.33. Creatinine of 3.03 on admission and stable since that time. Patient appears overloaded, although she was hypotensive on admission. Renal ultrasound unremarkable.  - Appreciate nephrology assistance.  - Repeat metabolic panel daily.   Anemia of CKD: s/p 1u PRBC 1/6.  - Monitor hgb, up as anticipated.  Hyperphosphatemia: Likely secondary to kidney disease. Discussed with nephrology who recommended transition  to calcium acetate.  - Calcium acetate - Renal diet   Hypomagnesemia - Improved. Will continue monitoring.   Metabolic acidosis: Due to renal impairment - Improved, will continue bicarbonate  tablets.   Constipation:  - Senokot daily, sorbitol prn. Continue this to limit further impaction - s/p disimpaction and soap suds enema  Hyperlipidemia - Continue atorvastatin   T2DM: Chronically uncontrolled with hyperglycemia (HbA1c 9.9%) acutely hypoglycemic on 1/1.  - Continue SSI (stopped basal) - HHRN will be ordered at DC (as well as PT/OT/aide)   Paroxysmal atrial fibrillation -Continue Eliquis   History of seizures - Continue Keppra    Abnormal right ABI: Noncompressible arteries, abnormal TBI of 0.26.  - Outpatient vascular surgery referral. Certainly would not perform angio with current renal function.  AML: In remission. Patient follows with Dr. [name] as an outpatient.   CAD: Patient with a history of NSTEMI, treated medically.  - Continue plavix, atorvastatin - Can restart BB, imdur if BP trends continue improving.  Patrecia Pour, MD [facility] [email or web address] 12/15/2022, 4:32 PM

## 2022-12-15 NOTE — Progress Notes (Addendum)
PT Cancellation Note  Patient Details Name: MALEEAH CROSSMAN MRN: [id number] DOB: 1941/06/13   Cancelled Treatment:    Reason Eval/Treat Not Completed: Medical issues which prohibited therapy. Pt impacted. RN preparing to administer soap suds enema. PT to re-attempt as time allows, and pt able.   1204 addendum: PT re-attempted. Pt stating "I just can't do anything right now. It is still coming out of me like water."   Lorriane Shire 12/15/2022, 11:10 AM

## 2022-12-15 NOTE — Progress Notes (Signed)
Patient ID: [name], female   DOB: February 09, 1941, 82 y.o.   MRN: [id number] [facility] Progress Note   Assessment/ Plan:   1. Acute kidney Injury on chronic kidney disease stage IV: Suspect to be hemodynamically mediated in the setting of CHF exacerbation and urinary tract infection.  Renal function essentially unchanged overnight with decent urine output.  Will continue furosemide at current dose with ongoing monitoring and replace potassium. 2.  Hypokalemia: Mild and secondary to ongoing diuretic use, will replace via oral route. 3.  Klebsiella urinary tract infection: Status post completion of antimicrobial therapy. 4.  Acute exacerbation of diastolic heart failure: Ongoing diuresis with fair urine output and net negative fluid balance. 5.  Hypocalcemia/hyperphosphatemia: This appears to be likely associated with secondary hyperparathyroidism and is improving with ongoing replacement/phosphorus binders.  Subjective:   Reports to be feeling better and still having intermittent shortness of breath with mild activity.  Denies chest pain   Objective:   BP 114/85 (BP Location: Left Arm)   Pulse 98   Temp 98.2 F (36.8 C) (Oral)   Resp 17   Ht '5\' 2"'$  (1.575 m)   Wt 75.8 kg   SpO2 100%   BMI 30.58 kg/m   Intake/Output Summary (Last 24 hours) at 12/15/2022 0944 Last data filed at 12/15/2022 0700 Gross per 24 hour  Intake 237 ml  Output 1200 ml  Net -963 ml   Weight change: 0.395 kg  Physical Exam: Gen: Comfortably sitting propped up in bed with oxygen via [place] CVS: Pulse regular rhythm, normal rate, S1 and S2 normal Resp: Fine rales bilateral lower lung fields, no wheeze/rhonchi Abd: Soft, flat, nontender, bowel sounds normal Ext: 1+ pitting edema bilaterally  Imaging: No results found.  Labs: BMET Recent Labs  Lab 12/09/22 0527 12/09/22 1243 12/10/22 0444 12/11/22 0447 12/12/22 0450 12/13/22 0500 12/14/22 0447 12/15/22 0534  NA 141  --  137 133* 140 141  139 139  K 3.2*  --  3.8 3.5 3.6 3.5 3.5 3.4*  CL 109  --  104 100 104 107 104 102  CO2 20*  --  '22 22 24 22 25 27  '$ GLUCOSE 96  --  129* 266* 258* 198* 177* 176*  BUN 74*  --  73* 72* 72* 69* 69* 69*  CREATININE 3.02*  --  3.12* 3.18* 3.07* 2.87* 2.85* 2.80*  CALCIUM 5.7* 5.4* 5.8* 6.5* 6.8* 6.6* 6.7* 6.5*  PHOS 7.3*  --  6.6* 5.9* 5.2* 4.8* 4.5 4.2   CBC Recent Labs  Lab 12/10/22 0444 12/11/22 0447 12/13/22 0500 12/13/22 2000 12/14/22 0447  WBC 7.1 6.5 4.1  --  4.8  HGB 7.4* 7.5* 6.9* 8.6* 8.5*  HCT 22.8* 23.2* 21.6* 26.1* 27.0*  MCV 93.8 95.9 95.6  --  94.7  PLT 128* 147* 108*  --  155    Medications:     apixaban  2.5 mg Oral BID   atorvastatin  40 mg Oral Daily   calcium acetate  1,334 mg Oral TID WC   Chlorhexidine Gluconate Cloth  6 each Topical Daily   clopidogrel  75 mg Oral QPM   furosemide  80 mg Intravenous Q8H   insulin aspart  0-9 Units Subcutaneous TID WC   leptospermum manuka honey  1 Application Topical Daily   letrozole  2.5 mg Oral Daily   levETIRAcetam  500 mg Oral BID   mupirocin ointment   Topical Daily   senna-docusate  1 tablet Oral BID   sodium  bicarbonate  650 mg Oral TID   Vitamin D (Ergocalciferol)  50,000 Units Oral Q7 days   Elmarie Shiley, MD 12/15/2022, 9:44 AM

## 2022-12-15 NOTE — Progress Notes (Signed)
Mobility Specialist - Progress Note   12/15/22 1224  Mobility  Activity Contraindicated/medical hold   Pt with soap subs enema and is having liquid stool. Will follow up if time permits.   Franki Monte  Mobility Specialist Please contact via Solicitor or Rehab office at [phone]

## 2022-12-16 DIAGNOSIS — R531 Weakness: Secondary | ICD-10-CM | POA: Diagnosis not present

## 2022-12-16 DIAGNOSIS — I5033 Acute on chronic diastolic (congestive) heart failure: Secondary | ICD-10-CM | POA: Diagnosis not present

## 2022-12-16 DIAGNOSIS — N3000 Acute cystitis without hematuria: Secondary | ICD-10-CM | POA: Diagnosis not present

## 2022-12-16 DIAGNOSIS — D638 Anemia in other chronic diseases classified elsewhere: Secondary | ICD-10-CM | POA: Diagnosis not present

## 2022-12-16 LAB — RENAL FUNCTION PANEL
Albumin: 2.3 g/dL — ABNORMAL LOW (ref 3.5–5.0)
Anion gap: 9 (ref 5–15)
BUN: 67 mg/dL — ABNORMAL HIGH (ref 8–23)
CO2: 28 mmol/L (ref 22–32)
Calcium: 6.9 mg/dL — ABNORMAL LOW (ref 8.9–10.3)
Chloride: 103 mmol/L (ref 98–111)
Creatinine, Ser: 2.77 mg/dL — ABNORMAL HIGH (ref 0.44–1.00)
GFR, Estimated: 17 mL/min — ABNORMAL LOW (ref >60)
Glucose, Bld: 140 mg/dL — ABNORMAL HIGH (ref 70–99)
Phosphorus: 3.7 mg/dL (ref 2.5–4.6)
Potassium: 3.7 mmol/L (ref 3.5–5.1)
Sodium: 140 mmol/L (ref 135–145)

## 2022-12-16 LAB — GLUCOSE, CAPILLARY
Glucose-Capillary: 128 mg/dL — ABNORMAL HIGH (ref 70–99)
Glucose-Capillary: 201 mg/dL — ABNORMAL HIGH (ref 70–99)
Glucose-Capillary: 180 mg/dL — ABNORMAL HIGH (ref 70–99)
Glucose-Capillary: 71 mg/dL (ref 70–99)

## 2022-12-16 LAB — MAGNESIUM: Magnesium: 2 mg/dL (ref 1.7–2.4)

## 2022-12-16 LAB — CBC
HCT: 25.5 % — ABNORMAL LOW (ref 36.0–46.0)
Hemoglobin: 8.3 g/dL — ABNORMAL LOW (ref 12.0–15.0)
MCH: 31.1 pg (ref 26.0–34.0)
MCHC: 32.5 g/dL (ref 30.0–36.0)
MCV: 95.5 fL (ref 80.0–100.0)
Platelets: 153 10*3/uL (ref 150–400)
RBC: 2.67 MIL/uL — ABNORMAL LOW (ref 3.87–5.11)
RDW: 14.5 % (ref 11.5–15.5)
WBC: 6.8 10*3/uL (ref 4.0–10.5)
nRBC: 0 % (ref 0.0–0.2)

## 2022-12-16 MED ORDER — FUROSEMIDE 10 MG/ML IJ SOLN
120.0000 mg | Freq: Three times a day (TID) | INTRAVENOUS | Status: DC
  Administered 2022-12-16 – 2022-12-18 (×6): 120 mg via INTRAVENOUS
  Filled 2022-12-16 (×3): qty 10
  Filled 2022-12-16: qty 12
  Filled 2022-12-16: qty 10
  Filled 2022-12-16: qty 12
  Filled 2022-12-16: qty 10
  Filled 2022-12-16: qty 2

## 2022-12-16 MED ORDER — POTASSIUM CHLORIDE CRYS ER 10 MEQ PO TBCR
10.0000 meq | EXTENDED_RELEASE_TABLET | Freq: Two times a day (BID) | ORAL | Status: AC
  Administered 2022-12-16 – 2022-12-17 (×3): 10 meq via ORAL
  Filled 2022-12-16 (×3): qty 1

## 2022-12-16 MED ORDER — METOLAZONE 5 MG PO TABS
2.5000 mg | ORAL_TABLET | Freq: Once | ORAL | Status: AC
  Administered 2022-12-16: 2.5 mg via ORAL
  Filled 2022-12-16: qty 1

## 2022-12-16 NOTE — Plan of Care (Signed)
  Problem: Education: Goal: Knowledge of General Education information will improve Description: Including pain rating scale, medication(s)/side effects and non-pharmacologic comfort measures Outcome: Progressing   Problem: Health Behavior/Discharge Planning: Goal: Ability to manage health-related needs will improve Outcome: Progressing   Problem: Clinical Measurements: Goal: Respiratory complications will improve Outcome: Progressing   Problem: Clinical Measurements: Goal: Cardiovascular complication will be avoided Outcome: Progressing   Problem: Activity: Goal: Risk for activity intolerance will decrease Outcome: Progressing   Problem: Nutrition: Goal: Adequate nutrition will be maintained Outcome: Progressing   Problem: Coping: Goal: Level of anxiety will decrease Outcome: Progressing   Problem: Pain Managment: Goal: General experience of comfort will improve Outcome: Progressing   Problem: Safety: Goal: Ability to remain free from injury will improve Outcome: Progressing   Problem: Skin Integrity: Goal: Risk for impaired skin integrity will decrease Outcome: Progressing   

## 2022-12-16 NOTE — Plan of Care (Signed)

## 2022-12-16 NOTE — Progress Notes (Signed)
Patient ID: [name], female   DOB: 04/03/1941, 82 y.o.   MRN: [id number] [facility] Progress Note   Assessment/ Plan:   1. Acute kidney Injury on chronic kidney disease stage IV: Suspect to be hemodynamically mediated in the setting of CHF exacerbation and urinary tract infection.  Renal function essentially unchanged overnight.  Will increase IV furosemide dose to 120 mg TID and give a single dose of metolazone 2.'5mg'$  (based on poor UOP and unchanged weight overnight). Will replace potassium. 2.  Hypokalemia: Mild and secondary to ongoing diuretic use, replacing via oral route. 3.  Klebsiella urinary tract infection: Status post completion of antimicrobial therapy. 4.  Acute exacerbation of diastolic heart failure: Ongoing diuresis with fair urine output and unchanged weight- increased diuretic dose/added metolazone x 1 dose. 5.  Hypocalcemia/hyperphosphatemia: This appears to be likely associated with secondary hyperparathyroidism and is improving with ongoing replacement/phosphorus binders.  Subjective:   Reports to be still having intermittent shortness of breath with mild activity.  Denies chest pain   Objective:   BP 138/70   Pulse 76   Temp 97.6 F (36.4 C) (Oral)   Resp 19   Ht '5\' 2"'$  (1.575 m)   Wt 75.5 kg   SpO2 98%   BMI 30.44 kg/m   Intake/Output Summary (Last 24 hours) at 12/16/2022 0947 Last data filed at 12/16/2022 0500 Gross per 24 hour  Intake 234 ml  Output 951 ml  Net -717 ml   Weight change: -0.6 kg  Physical Exam: Gen: Comfortably sitting on the side of her bed with oxygen via [place] CVS: Pulse regular rhythm, normal rate, S1 and S2 normal Resp: Fine rales right lung base with no rales on left, no wheeze/rhonchi Abd: Soft, flat, nontender, bowel sounds normal Ext: 1+ pitting edema bilaterally, legs in ACE wraps  Imaging: No results found.  Labs: BMET Recent Labs  Lab 12/10/22 0444 12/11/22 0447 12/12/22 0450 12/13/22 0500  12/14/22 0447 12/15/22 0534 12/16/22 0535  NA 137 133* 140 141 139 139 140  K 3.8 3.5 3.6 3.5 3.5 3.4* 3.7  CL 104 100 104 107 104 102 103  CO2 '22 22 24 22 25 27 28  '$ GLUCOSE 129* 266* 258* 198* 177* 176* 140*  BUN 73* 72* 72* 69* 69* 69* 67*  CREATININE 3.12* 3.18* 3.07* 2.87* 2.85* 2.80* 2.77*  CALCIUM 5.8* 6.5* 6.8* 6.6* 6.7* 6.5* 6.9*  PHOS 6.6* 5.9* 5.2* 4.8* 4.5 4.2 3.7   CBC Recent Labs  Lab 12/11/22 0447 12/13/22 0500 12/13/22 2000 12/14/22 0447 12/16/22 0535  WBC 6.5 4.1  --  4.8 6.8  HGB 7.5* 6.9* 8.6* 8.5* 8.3*  HCT 23.2* 21.6* 26.1* 27.0* 25.5*  MCV 95.9 95.6  --  94.7 95.5  PLT 147* 108*  --  155 153    Medications:     apixaban  2.5 mg Oral BID   atorvastatin  40 mg Oral Daily   calcium acetate  1,334 mg Oral TID WC   Chlorhexidine Gluconate Cloth  6 each Topical Daily   clopidogrel  75 mg Oral QPM   insulin aspart  0-9 Units Subcutaneous TID WC   leptospermum manuka honey  1 Application Topical Daily   letrozole  2.5 mg Oral Daily   levETIRAcetam  500 mg Oral BID   metolazone  2.5 mg Oral Once   mupirocin ointment   Topical Daily   potassium chloride  10 mEq Oral BID   senna-docusate  1 tablet Oral BID  sodium bicarbonate  650 mg Oral TID   Vitamin D (Ergocalciferol)  50,000 Units Oral Q7 days   Elmarie Shiley, MD 12/16/2022, 9:47 AM

## 2022-12-16 NOTE — Progress Notes (Signed)
[facility] PROGRESS NOTE  [name] (DOB: Mar 22, 1941) VXY:[id number] PCP: Isaac Bliss, Rayford Halsted, MD  Brief Narrative: [name] is a 82 y.o. female with a history of AML, breast cancer, paroxysmal atrial fibrillation on Eliquis, chronic diastolic heart failure, diabetes mellitus type 2, CKD stage IV. Patient presented secondary to generalized weakness and found to have evidence of UTI, hypocalcemia and acute heart failure. Patient started on calcium replacement, empiric Ceftriaxone and given Lasix. Associated hyperphosphatemia.   With supplementation of calcium and vitamin D, levels have improved. UTI treated. Renal function remains poor and patient developing increasing volume overload, so IV lasix initiated. Nephrology assisting.  Subjective: Continues to have dyspnea worse with exertion, no other complaints. Feet don't hurt at rest.  Objective: BP 138/70   Pulse 76   Temp 97.6 F (36.4 C) (Oral)   Resp 19   Ht '5\' 2"'$  (1.575 m)   Wt 75.5 kg   SpO2 98%   BMI 30.44 kg/m   Gen: Elderly female in no distress Pulm: Nonlabored with O2, no wheezes, +crackles CV: RRR, edema stable unchanged GI: Soft, NT, ND, +BS  Neuro: Alert and oriented. No new focal deficits. Ext: Warm, no deformities Skin: No new rashes, lesions or ulcers on visualized skin   Assessment & Plan: Left foot pain: Has wound on great toe after dropping a jar of pennies on it. No cellulitis is evident at this time. No fracture on radiographs. Toe is dusky but warm.  - Local wound care per WOC - Continue unna boots - ABI normal on left.  - WBAT L foot, can use postop shoe if desired.   Acute cystitis: Urine culture significant for Klebsiella pneumoniae, resistant to ampicillin and nitrofurantoin.   - Completed ceftriaxone, symptoms resolved.  Generalized weakness: Noted on admission. Possibly related to acute illness however, possibly related to hypocalcemia.  - PT/OT recommend home health on  discharge.   Acute on chronic diastolic heart failure Present on admission per history. BNP of 849.4. Patient given Lasix 20 mg IV on admission. Lasix held subsequently and received IV bicarbonate for a while. Overall is increasingly volume up. Weight currently 75.5kg, up from admission when it was 155 lbs (70.7kg). EDW thought to be near 63.3kg at last CHF discharge.  - Appreciate nephrology assistance with diuresis. Still sluggish with stable Cr, augment today with metolazone. - Continue strict I/O, daily weights.  - High suspicion for third spacing related to hypoalbuminemia.  - With difficulty diuresing and baseline significant frailty, DNR status is confirmed but ongoing goals of care discussions would be helpful. Consulted palliative care whose assistance is appreciated.   Hypocalcemia: Patient with associated hyperphosphatemia. Likely related to renal impairment.  - Remains low, though overall improved and not severe nor symptomatic. - Continue calcium acetate w/hyperphosphatemia.  - Vitamin D deficiency, level is < 20, supplement 50k units weekly started - PTH 307 consistent with hyperparathyroidism/MBD secondary to vitamin D deficiency and renal failure.   AKI on CKD stage IV: Baseline creatinine of about 2 with more recent creatinine of 4.33. Creatinine of 3.03 on admission and stable-to-slightly-improved since that time. Patient appears overloaded, although she was hypotensive on admission. Renal ultrasound unremarkable.  - Appreciate nephrology assistance.  - Repeat metabolic panel daily.   Anemia of CKD: s/p 1u PRBC 1/6.  - Monitor hgb, up as anticipated.  Hyperphosphatemia: Likely secondary to kidney disease. Discussed with nephrology who recommended transition to calcium acetate.  - Calcium acetate - Renal diet   Hypomagnesemia -  Improved. Will continue monitoring.   Metabolic acidosis: Due to renal impairment - Improved, will continue bicarbonate tablets.    Constipation:  - Senokot daily, sorbitol prn. Continue this to limit further impaction - s/p disimpaction and soap suds enema 1/8  Hyperlipidemia - Continue atorvastatin   T2DM: Chronically uncontrolled with hyperglycemia (HbA1c 9.9%) acutely hypoglycemic on 1/1.  - Continue SSI (stopped basal) - HHRN will be ordered at DC (as well as PT/OT/aide)   Paroxysmal atrial fibrillation -Continue Eliquis   History of seizures - Continue Keppra    Abnormal right ABI: Noncompressible arteries, abnormal TBI of 0.26.  - Outpatient vascular surgery referral. Certainly would not perform angio with current renal function.  AML: In remission. Patient follows with Dr. [name] as an outpatient.   CAD: Patient with a history of NSTEMI, treated medically.  - Continue plavix, atorvastatin - Can restart BB, imdur if BP trends continue improving.  Patrecia Pour, MD [facility] [email or web address] 12/16/2022, 2:49 PM

## 2022-12-16 NOTE — Progress Notes (Signed)
Physical Therapy Treatment Patient Details Name: [name] MRN: [id number] DOB: February 12, 1941 Today's Date: 12/16/2022   History of Present Illness 82 y.o. female admitted to [facility] on 12/06/2022 with suspected acute cystitis after presenting from home to [facility] ED complaining of generalized weakness and fall. PMH: AML, breast cancer, paroxysmal atrial fibrillation chronically anticoagulated on Eliquis, chronic diastolic heart failure, type 2 diabetes mellitus, CKD 4 associated baseline creatinine 1.8-3.0, anemia of chronic disease associated baseline hemoglobin 8-10,    PT Comments    Pt required min guard assist transfers and amb 10' with RW. Gait distance limited by L foot pain and fatigue. Pt performed LE exercises seated EOB. Pt remained EOB at end of session. She is mod I bed mobility.    Recommendations for follow up therapy are one component of a multi-disciplinary discharge planning process, led by the attending physician.  Recommendations may be updated based on patient status, additional functional criteria and insurance authorization.  Follow Up Recommendations  Home health PT     Assistance Recommended at Discharge Intermittent Supervision/Assistance  Patient can return home with the following A little help with walking and/or transfers;A little help with bathing/dressing/bathroom;Assistance with cooking/housework;Direct supervision/assist for medications management;Direct supervision/assist for financial management;Assist for transportation;Help with stairs or ramp for entrance   Equipment Recommendations  None recommended by PT    Recommendations for Other Services       Precautions / Restrictions Precautions Precautions: Fall Restrictions LLE Weight Bearing: Weight bearing as tolerated     Mobility  Bed Mobility Overal bed mobility: Needs Assistance Bed Mobility: Sidelying to Sit, Sit to Sidelying   Sidelying to sit: Supervision     Sit to sidelying:  Supervision General bed mobility comments: +rail    Transfers Overall transfer level: Needs assistance Equipment used: Rolling walker (2 wheels) Transfers: Sit to/from Stand Sit to Stand: Min guard, From elevated surface                Ambulation/Gait Ambulation/Gait assistance: Min guard Gait Distance (Feet): 10 Feet Assistive device: Rolling walker (2 wheels) Gait Pattern/deviations: Step-through pattern, Decreased stride length, Trunk flexed Gait velocity: decreased     General Gait Details: Distance limited by fatigue and L foot pain.   Stairs             Wheelchair Mobility    Modified Rankin (Stroke Patients Only)       Balance Overall balance assessment: Needs assistance Sitting-balance support: No upper extremity supported, Feet supported Sitting balance-Leahy Scale: Good     Standing balance support: Bilateral upper extremity supported, Reliant on assistive device for balance, During functional activity Standing balance-Leahy Scale: Poor                              Cognition Arousal/Alertness: Awake/alert Behavior During Therapy: WFL for tasks assessed/performed Overall Cognitive Status: Within Functional Limits for tasks assessed                                          Exercises General Exercises - Lower Extremity Ankle Circles/Pumps: AROM, Both, 10 reps, Seated Long Arc Quad: AROM, Right, Left, 10 reps, Seated Hip ABduction/ADduction: AROM, Both, 10 reps, Seated Hip Flexion/Marching: AROM, Right, Left, 10 reps, Seated    General Comments General comments (skin integrity, edema, etc.): VSS on 2L  Pertinent Vitals/Pain Pain Assessment Pain Assessment: 0-10 Pain Score: 5  Pain Location: L foot Pain Descriptors / Indicators: Discomfort, Guarding, Grimacing Pain Intervention(s): Limited activity within patient's tolerance, Monitored during session    Home Living                           Prior Function            PT Goals (current goals can now be found in the care plan section) Acute Rehab PT Goals Patient Stated Goal: home Progress towards PT goals: Progressing toward goals    Frequency    Min 3X/week      PT Plan Current plan remains appropriate    Co-evaluation              AM-PAC PT "6 Clicks" Mobility   Outcome Measure  Help needed turning from your back to your side while in a flat bed without using bedrails?: None Help needed moving from lying on your back to sitting on the side of a flat bed without using bedrails?: None Help needed moving to and from a bed to a chair (including a wheelchair)?: A Little Help needed standing up from a chair using your arms (e.g., wheelchair or bedside chair)?: A Little Help needed to walk in hospital room?: A Little Help needed climbing 3-5 steps with a railing? : A Lot 6 Click Score: 19    End of Session Equipment Utilized During Treatment: Gait belt;Oxygen Activity Tolerance: Patient tolerated treatment well Patient left: in bed;with call bell/phone within reach;with bed alarm set Nurse Communication: Mobility status PT Visit Diagnosis: Unsteadiness on feet (R26.81);Other abnormalities of gait and mobility (R26.89);Muscle weakness (generalized) (M62.81);Difficulty in walking, not elsewhere classified (R26.2);Pain Pain - Right/Left: Left Pain - part of body: Ankle and joints of foot     Time: 1006-1031 PT Time Calculation (min) (ACUTE ONLY): 25 min  Charges:  $Gait Training: 8-22 mins $Therapeutic Exercise: 8-22 mins                     Lorrin Goodell, PT  Office # [phone] Pager [phone]    [name] 12/16/2022, 11:08 AM

## 2022-12-17 DIAGNOSIS — Z7189 Other specified counseling: Secondary | ICD-10-CM

## 2022-12-17 DIAGNOSIS — D638 Anemia in other chronic diseases classified elsewhere: Secondary | ICD-10-CM | POA: Diagnosis not present

## 2022-12-17 DIAGNOSIS — I5033 Acute on chronic diastolic (congestive) heart failure: Secondary | ICD-10-CM | POA: Diagnosis not present

## 2022-12-17 DIAGNOSIS — Z515 Encounter for palliative care: Secondary | ICD-10-CM | POA: Diagnosis not present

## 2022-12-17 DIAGNOSIS — N3000 Acute cystitis without hematuria: Secondary | ICD-10-CM | POA: Diagnosis not present

## 2022-12-17 DIAGNOSIS — N184 Chronic kidney disease, stage 4 (severe): Secondary | ICD-10-CM | POA: Diagnosis not present

## 2022-12-17 DIAGNOSIS — R531 Weakness: Secondary | ICD-10-CM | POA: Diagnosis not present

## 2022-12-17 LAB — RENAL FUNCTION PANEL
Albumin: 2.4 g/dL — ABNORMAL LOW (ref 3.5–5.0)
Anion gap: 12 (ref 5–15)
BUN: 67 mg/dL — ABNORMAL HIGH (ref 8–23)
CO2: 27 mmol/L (ref 22–32)
Calcium: 7 mg/dL — ABNORMAL LOW (ref 8.9–10.3)
Chloride: 101 mmol/L (ref 98–111)
Creatinine, Ser: 2.36 mg/dL — ABNORMAL HIGH (ref 0.44–1.00)
GFR, Estimated: 20 mL/min — ABNORMAL LOW (ref >60)
Glucose, Bld: 121 mg/dL — ABNORMAL HIGH (ref 70–99)
Phosphorus: 3.7 mg/dL (ref 2.5–4.6)
Potassium: 3.8 mmol/L (ref 3.5–5.1)
Sodium: 140 mmol/L (ref 135–145)

## 2022-12-17 LAB — GLUCOSE, CAPILLARY
Glucose-Capillary: 121 mg/dL — ABNORMAL HIGH (ref 70–99)
Glucose-Capillary: 120 mg/dL — ABNORMAL HIGH (ref 70–99)
Glucose-Capillary: 291 mg/dL — ABNORMAL HIGH (ref 70–99)
Glucose-Capillary: 221 mg/dL — ABNORMAL HIGH (ref 70–99)
Glucose-Capillary: 176 mg/dL — ABNORMAL HIGH (ref 70–99)

## 2022-12-17 LAB — MAGNESIUM: Magnesium: 1.9 mg/dL (ref 1.7–2.4)

## 2022-12-17 MED ORDER — METOLAZONE 5 MG PO TABS
2.5000 mg | ORAL_TABLET | Freq: Once | ORAL | Status: AC
  Administered 2022-12-17: 2.5 mg via ORAL
  Filled 2022-12-17: qty 1

## 2022-12-17 NOTE — Progress Notes (Signed)
Patient ID: [name], female   DOB: 1941/09/27, 82 y.o.   MRN: [id number] [facility] Progress Note   Assessment/ Plan:   1. Acute kidney Injury on chronic kidney disease stage IV: Suspect to be hemodynamically mediated in the setting of CHF exacerbation and urinary tract infection.  Renal function improved overnight.  Will continue IV furosemide 120 mg TID and give another single dose of metolazone 2.'5mg'$  today based on improved UOP/symptoms overnight. 2.  Hypokalemia: Mild and secondary to ongoing diuretic use, replaced via oral route. 3.  Klebsiella urinary tract infection: Status post completion of antimicrobial therapy. 4.  Acute exacerbation of diastolic heart failure: Ongoing diuresis with improved urine output but no weight from this AM. Symptomatically better. 5.  Hypocalcemia/hyperphosphatemia: This appears to be likely associated with secondary hyperparathyroidism and is improving with ongoing replacement/phosphorus binders.  Subjective:   Reports to be feeling better after big bowel movement from having enema. Shortness of breath improved.    Objective:   BP 105/62 (BP Location: Left Arm)   Pulse 98   Temp 97.7 F (36.5 C) (Oral)   Resp 18   Ht '5\' 2"'$  (1.575 m)   Wt 75.5 kg   SpO2 98%   BMI 30.44 kg/m   Intake/Output Summary (Last 24 hours) at 12/17/2022 1032 Last data filed at 12/17/2022 0900 Gross per 24 hour  Intake 599 ml  Output 1700 ml  Net -1101 ml   Weight change:   Physical Exam: Gen: Comfortably resting flat in bed- not on oxygen CVS: Pulse regular rhythm, normal rate, S1 and S2 normal Resp: Fine rales right lung base with no rales on left, no wheeze/rhonchi Abd: Soft, flat, nontender, bowel sounds normal Ext: 1+ pitting edema bilaterally, legs in ACE wraps  Imaging: No results found.  Labs: BMET Recent Labs  Lab 12/11/22 0447 12/12/22 0450 12/13/22 0500 12/14/22 0447 12/15/22 0534 12/16/22 0535 12/17/22 0503  NA 133* 140 141  139 139 140 140  K 3.5 3.6 3.5 3.5 3.4* 3.7 3.8  CL 100 104 107 104 102 103 101  CO2 '22 24 22 25 27 28 27  '$ GLUCOSE 266* 258* 198* 177* 176* 140* 121*  BUN 72* 72* 69* 69* 69* 67* 67*  CREATININE 3.18* 3.07* 2.87* 2.85* 2.80* 2.77* 2.36*  CALCIUM 6.5* 6.8* 6.6* 6.7* 6.5* 6.9* 7.0*  PHOS 5.9* 5.2* 4.8* 4.5 4.2 3.7 3.7   CBC Recent Labs  Lab 12/11/22 0447 12/13/22 0500 12/13/22 2000 12/14/22 0447 12/16/22 0535  WBC 6.5 4.1  --  4.8 6.8  HGB 7.5* 6.9* 8.6* 8.5* 8.3*  HCT 23.2* 21.6* 26.1* 27.0* 25.5*  MCV 95.9 95.6  --  94.7 95.5  PLT 147* 108*  --  155 153    Medications:     apixaban  2.5 mg Oral BID   atorvastatin  40 mg Oral Daily   calcium acetate  1,334 mg Oral TID WC   Chlorhexidine Gluconate Cloth  6 each Topical Daily   clopidogrel  75 mg Oral QPM   insulin aspart  0-9 Units Subcutaneous TID WC   leptospermum manuka honey  1 Application Topical Daily   letrozole  2.5 mg Oral Daily   levETIRAcetam  500 mg Oral BID   metolazone  2.5 mg Oral Once   mupirocin ointment   Topical Daily   potassium chloride  10 mEq Oral BID   senna-docusate  1 tablet Oral BID   sodium bicarbonate  650 mg Oral TID   Vitamin  D (Ergocalciferol)  50,000 Units Oral Q7 days   Elmarie Shiley, MD 12/17/2022, 10:32 AM

## 2022-12-17 NOTE — Plan of Care (Signed)

## 2022-12-17 NOTE — Progress Notes (Signed)
OT Cancellation Note  Patient Details Name: [name] MRN: [id number] DOB: [birth year]   Cancelled Treatment:    Reason Eval/Treat Not Completed: Patient declined, no reason specified (attempted x3, pt declining. RN  reporting pt now getting enema and pt declining therapy for today. Will follow up for OT tx tomorrow as schedule permits.)  Renaye Rakers, OTD, OTR/L SecureChat Preferred Acute Rehab [phone]   Ulla Gallo 12/17/2022, 11:36 AM

## 2022-12-17 NOTE — Consult Note (Signed)
Consultation Note Date: 12/17/2022   Patient Name: [name]  DOB: 05/31/1941  MRN: [id number]  Age / Sex: 82 y.o., female  PCP: Isaac Bliss, Rayford Halsted, MD Referring Physician: Patrecia Pour, MD  Reason for Consultation: Establishing goals of care  HPI/Patient Profile: 82 y.o. female  with past medical history of AML, breast cancer, paroxysmal atrial fibrillation on Eliquis, chronic diastolic heart failure, diabetes mellitus type 2, and CKD stage IV admitted on 12/06/2022 with UTI, hypocalcemia, and acute heart failure.  Renal function was poor and patient was developing increasing volume overload so nephrology was consulted and IV Lasix initiated.  Patient now starting to diurese and feeling better.  PMT consulted to discuss goals of care.  Clinical Assessment and Goals of Care: I have reviewed medical records including EPIC notes, labs and imaging, assessed the patient and then met with patient to discuss diagnosis prognosis, GOC, EOL wishes, disposition and options.  I introduced Palliative Medicine as specialized medical care for people living with serious illness. It focuses on providing relief from the symptoms and stress of a serious illness. The goal is to improve quality of life for both the patient and the family.  Patient lives at home with her spouse.  She tells me he is able to serve as her caregiver when needed.  As far as functional and nutritional status she tells me she is able to get around her house with a walker.  She tells me she has a good appetite.   We discussed patient's current illness and what it means in the larger context of patient's on-going co-morbidities.  Natural disease trajectory and expectations at EOL were discussed.  We discussed her kidney disease as well as her heart disease.  We discussed difficulty of managing both heart and kidney disease as they affect one another.  We discussed her multiple  hospitalizations here recently.  I attempted to elicit values and goals of care important to the patient.  She tells me she is open to any medical interventions that will prolong her life.  She does share she would not want to go through something like CPR and that when it is her "her time" she is ready to go.  She did states she would be open to dialysis if that were ever needed and presented to her as an option.  The difference between aggressive medical intervention and comfort care was considered in light of the patient's goals of care.   Discussed with patient the importance of continued conversation with family and the medical providers regarding overall plan of care and treatment options, ensuring decisions are within the context of the patients values and GOCs.    She is planning on discharge home in the next day or 2 with home health.  She tells me she would want her spouse Gwyndolyn Saxon to serve as Marine scientist if she were ever unable.  Questions and concerns were addressed. The family was encouraged to call with questions or concerns.  Primary Decision Maker PATIENT Spouse willing to serve as decision-maker if patient unable  SUMMARY OF RECOMMENDATIONS   To remain DNR per previous documentation but open to all other medical interventions offered to prolong life Hopeful to DC home with home health in coming days  Discharge Planning: Home with Home Health      Primary Diagnoses: Present on Admission:  Acute cystitis  Acute on chronic diastolic CHF (congestive heart failure) (HCC)  Hypocalcemia  Paroxysmal atrial fibrillation (HCC)  Hyperlipidemia  CKD (chronic  kidney disease) stage 4, GFR 15-29 ml/min (HCC)  Anemia of chronic disease   I have reviewed the medical record, interviewed the patient and family, and examined the patient. The following aspects are pertinent.  Past Medical History:  Diagnosis Date   Acute myelogenous leukemia ([place]) 02/2014   Acute pancreatitis     Arthritis    "back" (09/17/2017)   Atrial flutter (HCC)    Cardiomyopathy (HCC)    CHF (congestive heart failure) (HCC)    Chronic kidney disease, stage II (mild)    Chronic lower back pain    Colon polyps 04/29/2010   TUBULAR ADENOMA AND A SERRATED ADENOMA   COPD (chronic obstructive pulmonary disease) ([place])    CORONARY ARTERY DISEASE 12/24/2007   DIABETES MELLITUS, TYPE II 07/13/2007   History of blood transfusion 2015   "related to leukemia"   History of kidney stones 12/24/2007   History of uterine cancer    HYPERLIPIDEMIA 12/24/2007   HYPERTENSION 07/13/2007   NSTEMI (non-ST elevated myocardial infarction) ([place]) 09/17/2017   Uterine cancer ([place])    Social History   Socioeconomic History   Marital status: Married    Spouse name: Nethra Mehlberg   Number of children: 1   Years of education: Not on file   Highest education level: 9th grade  Occupational History   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  Tobacco Use   Smoking status: Former    Packs/day: 1.00    Years: 20.00    Total pack years: 20.00    Types: Cigarettes    Quit date: 12/08/2000    Years since quitting: 22.0   Smokeless tobacco: Never  Vaping Use   Vaping Use: Never used  Substance and Sexual Activity   Alcohol use: No    Alcohol/week: 0.0 standard drinks of alcohol   Drug use: No   Sexual activity: Never  Other Topics Concern   Not on file  Social History Narrative   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'$    Social Determinants of Health   Financial Resource Strain: Low Risk  (10/22/2022)   Overall Financial Resource Strain (CARDIA)    Difficulty of Paying Living Expenses: Not very hard  Food Insecurity: No Food Insecurity (12/08/2022)   Hunger Vital Sign    Worried About Running Out of Food in the Last Year: Never true    [place] in the Last Year: Never true  Transportation Needs: No Transportation Needs (12/08/2022)   PRAPARE - Armed forces logistics/support/administrative officer (Medical): No    Lack of Transportation (Non-Medical): No  Physical Activity: Not on file  Stress: No Stress Concern Present (05/12/2022)   [place]    Feeling of Stress : Not at all  Social Connections: Not on file   Family History  Problem Relation Age of Onset   COPD Father    Esophageal cancer Father    Breast cancer Sister 12   Lung cancer Sister    Esophageal cancer Brother    Suicidality Brother    Lung cancer Sister    Lung cancer Sister    Cervical cancer Sister    Congestive Heart Failure Mother    Heart attack Maternal Grandmother    Skin cancer Maternal Grandfather    Heart attack Maternal Grandfather    Stroke Paternal Grandmother 41   Heart attack Paternal Grandfather    Cancer Maternal Uncle  unknown type, dx. >65   Cancer Paternal Aunt        unknown type, dx. >50   Cancer Paternal Uncle        unknown type, dx. >50   Cirrhosis Maternal Uncle    Skin cancer Daughter    Leukemia Cousin        maternal first cousin; dx. >83, in remission   Cancer Niece        unknown type behind her ear, dx. 30s/40s   Colon cancer Neg Hx    Scheduled Meds:  apixaban  2.5 mg Oral BID   atorvastatin  40 mg Oral Daily   calcium acetate  1,334 mg Oral TID WC   Chlorhexidine Gluconate Cloth  6 each Topical Daily   clopidogrel  75 mg Oral QPM   insulin aspart  0-9 Units Subcutaneous TID WC   leptospermum manuka honey  1 Application Topical Daily   letrozole  2.5 mg Oral Daily   levETIRAcetam  500 mg Oral BID   mupirocin ointment   Topical Daily   potassium chloride  10 mEq Oral BID   senna-docusate  1 tablet Oral BID   sodium bicarbonate  650 mg Oral TID   Vitamin D (Ergocalciferol)  50,000 Units Oral Q7 days   Continuous Infusions:  furosemide 120 mg (12/17/22 0500)   PRN Meds:.acetaminophen **OR** acetaminophen, LORazepam, melatonin, naLOXone (NARCAN)  injection,  oxyCODONE-acetaminophen, polyethylene glycol, sorbitol No Known Allergies Review of Systems  Constitutional:  Positive for activity change and fatigue.  Cardiovascular:  Positive for leg swelling.  Neurological:  Positive for weakness.    Physical Exam Constitutional:      General: She is not in acute distress.    Appearance: She is ill-appearing.  Pulmonary:     Effort: Pulmonary effort is normal.  Musculoskeletal:     Right lower leg: Edema present.     Left lower leg: Edema present.     Comments: Left great toe bandaged  Skin:    General: Skin is warm and dry.  Neurological:     Mental Status: She is alert and oriented to person, place, and time.     Vital Signs: BP 105/62 (BP Location: Left Arm)   Pulse 98   Temp 97.7 F (36.5 C) (Oral)   Resp 18   Ht '5\' 2"'$  (1.575 m)   Wt 75.5 kg   SpO2 98%   BMI 30.44 kg/m  Pain Scale: 0-10 POSS *See Group Information*: S-Acceptable,Sleep, easy to arouse Pain Score: 0-No pain   SpO2: SpO2: 98 % O2 Device:SpO2: 98 % O2 Flow Rate: .O2 Flow Rate (L/min): 2 L/min  IO: Intake/output summary:  Intake/Output Summary (Last 24 hours) at 12/17/2022 1435 Last data filed at 12/17/2022 1124 Gross per 24 hour  Intake 599 ml  Output 1800 ml  Net -1201 ml    LBM: Last BM Date : 12/15/22 Baseline Weight: Weight: 70.7 kg Most recent weight: Weight: 75.5 kg     Palliative Assessment/Data: PPS 40%     *Please note that this is a verbal dictation therefore any spelling or grammatical errors are due to the "[employer]" system interpretation.  Juel Burrow, DNP, AGNP-C Palliative Medicine Team [phone] Pager: [phone]

## 2022-12-17 NOTE — Progress Notes (Signed)
[facility] PROGRESS NOTE  [name] (DOB: 10-14-1941) YOV:[id number] PCP: Isaac Bliss, Rayford Halsted, MD  Brief Narrative: [name] is a 82 y.o. female with a history of AML, breast cancer, paroxysmal atrial fibrillation on Eliquis, chronic diastolic heart failure, diabetes mellitus type 2, CKD stage IV. Patient presented secondary to generalized weakness and found to have evidence of UTI, hypocalcemia and acute heart failure. Patient started on calcium replacement, empiric Ceftriaxone and given Lasix. Associated hyperphosphatemia.   With supplementation of calcium and vitamin D, levels have improved. UTI treated. Renal function remains poor and patient developing increasing volume overload, so IV lasix initiated. Nephrology assisting.  Subjective: Feels she has finally started urinating more in past 24 hours, swelling already considerably better. No chest pain or dyspnea. Says toe is healing.   Objective: BP 105/62 (BP Location: Left Arm)   Pulse 98   Temp 97.7 F (36.5 C) (Oral)   Resp 18   Ht '5\' 2"'$  (1.575 m)   Wt 75.5 kg   SpO2 98%   BMI 30.44 kg/m   Gen: No distress Pulm: Diminished but clear, nonlabored  CV: RRR, no JVD. Improved LE pitting edema GI: Soft, NT, ND, +BS  Neuro: Alert and oriented. No new focal deficits. Ext: Warm, no deformities Skin: L great toe wrapped, shallow abrasion to right long finger is c/d/I. No new rashes, lesions or ulcers on visualized skin   Assessment & Plan: Left foot pain: Has wound on great toe after dropping a jar of pennies on it. No cellulitis is evident at this time. No fracture on radiographs. Toe is dusky but warm.  - Local wound care per WOC - Continue unna boots - ABI normal on left.  - WBAT L foot, can use postop shoe if desired.   Acute cystitis: Urine culture significant for Klebsiella pneumoniae, resistant to ampicillin and nitrofurantoin.   - Completed ceftriaxone, symptoms resolved.  Generalized weakness:  Noted on admission. Possibly related to acute illness however, possibly related to hypocalcemia.  - PT/OT recommend home health on discharge.   Acute on chronic diastolic heart failure Present on admission per history. BNP of 849.4. Patient given Lasix 20 mg IV on admission. Lasix held subsequently and received IV bicarbonate for a while. Overall is increasingly volume up. Weight currently 75.5kg, up from admission when it was 155 lbs (70.7kg). EDW thought to be near 63.3kg at last CHF discharge.  - Appreciate nephrology assistance with diuresis. Making some headway with diuresis-1.9L since upping lasix and adding metolazone. Creatinine improved and edema already significantly improved. - Continue strict I/O, daily weights.  - High suspicion for third spacing related to hypoalbuminemia.  - DNR status is confirmed but ongoing goals of care discussions would be helpful. Palliative care consulted, whose assistance is appreciated.   Hypocalcemia: Patient with associated hyperphosphatemia. Likely related to renal impairment.  - Remains low, though overall improved and not severe nor symptomatic. - Continue calcium acetate w/hyperphosphatemia.  - Vitamin D deficiency, level is < 20, supplement 50k units weekly started - PTH 307 consistent with hyperparathyroidism/MBD secondary to vitamin D deficiency and renal failure.   AKI on CKD stage IV: Baseline creatinine of about 2 with more recent creatinine of 4.33. Creatinine of 3.03 on admission and stable-to-slightly-improved since that time. Patient appears overloaded, although she was hypotensive on admission. Renal ultrasound unremarkable.  - Appreciate nephrology assistance.  - Repeat metabolic panel daily.   Anemia of CKD: s/p 1u PRBC 1/6.  - Monitor hgb, up as  anticipated.  Hyperphosphatemia: Likely secondary to kidney disease. Discussed with nephrology who recommended transition to calcium acetate.  - Calcium acetate - Renal diet    Hypomagnesemia - Improved. Will continue monitoring.   Metabolic acidosis: Due to renal impairment - Improved, will continue bicarbonate tablets.   Constipation:  - Senokot daily, sorbitol prn. Continue this to limit further impaction - s/p disimpaction and soap suds enema 1/8  Hyperlipidemia - Continue atorvastatin   T2DM: Chronically uncontrolled with hyperglycemia (HbA1c 9.9%) acutely hypoglycemic on 1/1.  - Continue SSI (stopped basal) - HHRN will be ordered at DC (as well as PT/OT/aide)   Paroxysmal atrial fibrillation -Continue Eliquis   History of seizures - Continue Keppra    Abnormal right ABI: Noncompressible arteries, abnormal TBI of 0.26.  - Outpatient vascular surgery referral. Certainly would not perform angio with current renal function.  AML: In remission. Patient follows with Dr. [name] as an outpatient.   CAD: Patient with a history of NSTEMI, treated medically.  - Continue plavix, atorvastatin - Can restart BB, imdur if BP trends continue improving.  Patrecia Pour, MD [facility] [email or web address] 12/17/2022, 7:29 AM

## 2022-12-17 NOTE — Progress Notes (Signed)
PT Cancellation Note  Patient Details Name: [name] MRN: [id number] DOB: 03/19/1941   Cancelled Treatment:    Reason Eval/Treat Not Completed: Other (comment).  Getting enema and will be a hold per nsg for today.  Retry tomorrow.   Ramond Dial 12/17/2022, 12:01 PM  Mee Hives, PT PhD Acute Rehab Dept. Number: [place] and [place]

## 2022-12-17 NOTE — Progress Notes (Signed)
Mobility Specialist - Progress Note   12/17/22 1129  Mobility  Activity Contraindicated/medical hold   Pt currently received soap suds enema. Will follow up.   [name]  Mobility Specialist Please contact via Solicitor or Rehab office at [phone]

## 2022-12-18 DIAGNOSIS — N3000 Acute cystitis without hematuria: Secondary | ICD-10-CM | POA: Diagnosis not present

## 2022-12-18 LAB — RENAL FUNCTION PANEL
Albumin: 2.4 g/dL — ABNORMAL LOW (ref 3.5–5.0)
Anion gap: 9 (ref 5–15)
BUN: 69 mg/dL — ABNORMAL HIGH (ref 8–23)
CO2: 32 mmol/L (ref 22–32)
Calcium: 7.2 mg/dL — ABNORMAL LOW (ref 8.9–10.3)
Chloride: 99 mmol/L (ref 98–111)
Creatinine, Ser: 2.55 mg/dL — ABNORMAL HIGH (ref 0.44–1.00)
GFR, Estimated: 19 mL/min — ABNORMAL LOW (ref >60)
Glucose, Bld: 269 mg/dL — ABNORMAL HIGH (ref 70–99)
Phosphorus: 3.7 mg/dL (ref 2.5–4.6)
Potassium: 3.8 mmol/L (ref 3.5–5.1)
Sodium: 140 mmol/L (ref 135–145)

## 2022-12-18 LAB — CBC
HCT: 26.6 % — ABNORMAL LOW (ref 36.0–46.0)
Hemoglobin: 8.4 g/dL — ABNORMAL LOW (ref 12.0–15.0)
MCH: 30.8 pg (ref 26.0–34.0)
MCHC: 31.6 g/dL (ref 30.0–36.0)
MCV: 97.4 fL (ref 80.0–100.0)
Platelets: 160 10*3/uL (ref 150–400)
RBC: 2.73 MIL/uL — ABNORMAL LOW (ref 3.87–5.11)
RDW: 14.3 % (ref 11.5–15.5)
WBC: 7.4 10*3/uL (ref 4.0–10.5)
nRBC: 0 % (ref 0.0–0.2)

## 2022-12-18 LAB — GLUCOSE, CAPILLARY
Glucose-Capillary: 270 mg/dL — ABNORMAL HIGH (ref 70–99)
Glucose-Capillary: 122 mg/dL — ABNORMAL HIGH (ref 70–99)
Glucose-Capillary: 172 mg/dL — ABNORMAL HIGH (ref 70–99)
Glucose-Capillary: 175 mg/dL — ABNORMAL HIGH (ref 70–99)

## 2022-12-18 LAB — MAGNESIUM: Magnesium: 2 mg/dL (ref 1.7–2.4)

## 2022-12-18 MED ORDER — FUROSEMIDE 10 MG/ML IJ SOLN
80.0000 mg | Freq: Three times a day (TID) | INTRAMUSCULAR | Status: DC
  Administered 2022-12-18 – 2022-12-19 (×3): 80 mg via INTRAVENOUS
  Filled 2022-12-18 (×3): qty 8

## 2022-12-18 MED ORDER — POLYETHYLENE GLYCOL 3350 17 G PO PACK
17.0000 g | PACK | Freq: Two times a day (BID) | ORAL | Status: DC
  Administered 2022-12-18 – 2022-12-26 (×11): 17 g via ORAL
  Filled 2022-12-18 (×11): qty 1

## 2022-12-18 NOTE — Progress Notes (Signed)
PROGRESS NOTE    [name]  NUU:[id number] DOB: [birth year] DOA: 12/06/2022 PCP: Isaac Bliss, Rayford Halsted, MD  Chief Complaint  Patient presents with   Weakness    Brief Narrative:  [name] is [name] 82 y.o. female with [name] history of AML, breast cancer, paroxysmal atrial fibrillation on Eliquis, chronic diastolic heart failure, diabetes mellitus type 2, CKD stage IV. Patient presented secondary to generalized weakness and found to have evidence of UTI, hypocalcemia and acute heart failure. Patient started on calcium replacement, empiric Ceftriaxone and given Lasix. Associated hyperphosphatemia.    With supplementation of calcium and vitamin D, levels have improved. UTI treated. Renal function remains poor and patient developing increasing volume overload, so IV lasix initiated. Nephrology assisting.  Assessment & Plan:   Principal Problem:   Acute cystitis Active Problems:   Paroxysmal atrial fibrillation (HCC)   Hyperlipidemia   Anemia of chronic disease   CKD (chronic kidney disease) stage 4, GFR 15-29 ml/min (HCC)   DM2 (diabetes mellitus, type 2) (HCC)   Acute on chronic diastolic CHF (congestive heart failure) (HCC)   Generalized weakness   Hypocalcemia   History of seizures  Left foot pain: Has wound on great toe after dropping Prescious Hurless jar of pennies on it. No cellulitis is evident at this time. No fracture on radiographs. Toe is dusky but warm.  - Local wound care per WOC - Continue unna boots - ABI normal on left.  - WBAT L foot, can use postop shoe if desired.    Acute cystitis: Urine culture significant for Klebsiella pneumoniae, resistant to ampicillin and nitrofurantoin.   - Completed ceftriaxone, symptoms resolved.   Generalized weakness: Noted on admission. Possibly related to acute illness however, possibly related to hypocalcemia.  - PT/OT recommend home health on discharge.   Acute on chronic diastolic heart failure Present on admission per history. BNP of  849.4. Patient given Lasix 20 mg IV on admission. Lasix held subsequently and received IV bicarbonate for [name] while. Overall is increasingly volume up. Weight currently 75.5kg, up from admission when it was 155 lbs (70.7kg). EDW thought to be near 63.3kg at last CHF discharge.  - Appreciate nephrology assistance with diuresis. Making some headway with diuresis-1.9L since upping lasix and adding metolazone. Creatinine improved and edema already significantly improved. - Continue strict I/O, daily weights.  - High suspicion for third spacing related to hypoalbuminemia.  - DNR status is confirmed but ongoing goals of care discussions would be helpful. Palliative care consulted, whose assistance is appreciated.   Hypocalcemia: Patient with associated hyperphosphatemia. Likely related to renal impairment.  - Remains low, though overall improved and not severe nor symptomatic. - Continue calcium acetate w/hyperphosphatemia.  - Vitamin D deficiency, level is < 20, supplement 50k units weekly started - PTH 307 consistent with hyperparathyroidism/MBD secondary to vitamin D deficiency and renal failure.   AKI on CKD stage IV: Baseline creatinine of about 2 with more recent creatinine of 4.33. Creatinine of 3.03 on admission and stable-to-slightly-improved since that time. Patient appears overloaded, although she was hypotensive on admission. Renal ultrasound unremarkable.  - Appreciate nephrology assistance.  - Repeat metabolic panel daily.    Anemia of CKD: s/p 1u PRBC 1/6.  - Monitor hgb, up as anticipated.   Hyperphosphatemia: Likely secondary to kidney disease. Discussed with nephrology who recommended transition to calcium acetate.  - Calcium acetate - Renal diet   Hypomagnesemia - Improved. Will continue monitoring.   Metabolic acidosis: Due to renal impairment - Improved,  will continue bicarbonate tablets.   Constipation:  - Senokot daily, sorbitol prn. Continue this to limit further  impaction - enema reordered 1/11   Hyperlipidemia - Continue atorvastatin   T2DM: Chronically uncontrolled with hyperglycemia (HbA1c 9.9%) acutely hypoglycemic on 1/1.  - Continue SSI (stopped basal) - HHRN will be ordered at DC (as well as PT/OT/aide)   Paroxysmal atrial fibrillation -Continue Eliquis   History of seizures - Continue Keppra    Abnormal right ABI: Noncompressible arteries, abnormal TBI of 0.26.  - Outpatient vascular surgery referral.    AML: In remission. Patient follows with Dr. [name] as an outpatient.   CAD: Patient with [name] history of NSTEMI, treated medically.  - Continue plavix, atorvastatin - consider restarting BB, imdur    DVT prophylaxis: eliquis Code Status: dnr Family Communication: none Disposition:   Status is: Inpatient Remains inpatient appropriate because: ongoing diuresis   Consultants:  Renal  palliative  Procedures:  Summary:  Right: Resting right ankle-brachial index indicates noncompressible right  lower extremity arteries. The right toe-brachial index is abnormal.   Left: Resting left ankle-brachial index is within normal range.   Unable to obtain TBI due to great toe wound, and absent waveforms.   Antimicrobials:  Anti-infectives (From admission, onward)    Start     Dose/Rate Route Frequency Ordered Stop   12/07/22 2200  cefTRIAXone (ROCEPHIN) 1 g in sodium chloride 0.9 % 100 mL IVPB  Status:  Discontinued        1 g 200 mL/hr over 30 Minutes Intravenous Every 24 hours 12/07/22 0301 12/09/22 1220   12/07/22 0145  cefTRIAXone (ROCEPHIN) 2 g in sodium chloride 0.9 % 100 mL IVPB        2 g 200 mL/hr over 30 Minutes Intravenous  Once 12/07/22 0136 12/07/22 0540       Subjective: No new complaints  Objective: Vitals:   12/18/22 0800 12/18/22 0848 12/18/22 0938 12/18/22 1240  BP: (!) 147/62   126/65  Pulse: 86  86   Resp:    20  Temp:    (!) 97.3 F (36.3 C)  TempSrc:    Oral  SpO2: 97% 95% 98% 96%   Weight:      Height:        Intake/Output Summary (Last 24 hours) at 12/18/2022 1656 Last data filed at 12/18/2022 1251 Gross per 24 hour  Intake 720 ml  Output 2100 ml  Net -1380 ml   Filed Weights   12/15/22 0557 12/16/22 0235 12/18/22 0448  Weight: 75.8 kg 75.5 kg 73 kg    Examination:  General exam: Appears calm and comfortable  Respiratory system: unlabored Cardiovascular system: RRR Gastrointestinal system: Abdomen is nondistended, soft and nontender Central nervous system: Alert and oriented. No focal neurological deficits. Extremities: unna boots, bilateral LE edema   Data Reviewed: I have personally reviewed following labs and imaging studies  CBC: Recent Labs  Lab 12/13/22 0500 12/13/22 2000 12/14/22 0447 12/16/22 0535 12/18/22 0622  WBC 4.1  --  4.8 6.8 7.4  HGB 6.9* 8.6* 8.5* 8.3* 8.4*  HCT 21.6* 26.1* 27.0* 25.5* 26.6*  MCV 95.6  --  94.7 95.5 97.4  PLT 108*  --  155 153 297    Basic Metabolic Panel: Recent Labs  Lab 12/14/22 0447 12/15/22 0534 12/16/22 0535 12/17/22 0503 12/18/22 0622  NA 139 139 140 140 140  K 3.5 3.4* 3.7 3.8 3.8  CL 104 102 103 101 99  CO2 '25 27 28 27 '$ 32  GLUCOSE 177* 176* 140* 121* 269*  BUN 69* 69* 67* 67* 69*  CREATININE 2.85* 2.80* 2.77* 2.36* 2.55*  CALCIUM 6.7* 6.5* 6.9* 7.0* 7.2*  MG 1.8 1.8 2.0 1.9 2.0  PHOS 4.5 4.2 3.7 3.7 3.7    GFR: Estimated Creatinine Clearance: 16.5 mL/min (Greg Eckrich) (by C-G formula based on SCr of 2.55 mg/dL (H)).  Liver Function Tests: Recent Labs  Lab 12/14/22 0447 12/15/22 0534 12/16/22 0535 12/17/22 0503 12/18/22 0622  ALBUMIN 2.3* 2.2* 2.3* 2.4* 2.4*    CBG: Recent Labs  Lab 12/17/22 1717 12/17/22 2117 12/18/22 0757 12/18/22 1222 12/18/22 1634  GLUCAP 221* 176* 270* 122* 172*     No results found for this or any previous visit (from the past 240 hour(s)).       Radiology Studies: No results found.      Scheduled Meds:  apixaban  2.5 mg Oral BID    atorvastatin  40 mg Oral Daily   calcium acetate  1,334 mg Oral TID WC   Chlorhexidine Gluconate Cloth  6 each Topical Daily   clopidogrel  75 mg Oral QPM   furosemide  80 mg Intravenous Q8H   insulin aspart  0-9 Units Subcutaneous TID WC   leptospermum manuka honey  1 Application Topical Daily   letrozole  2.5 mg Oral Daily   levETIRAcetam  500 mg Oral BID   mupirocin ointment   Topical Daily   polyethylene glycol  17 g Oral BID   senna-docusate  1 tablet Oral BID   sodium bicarbonate  650 mg Oral TID   Vitamin D (Ergocalciferol)  50,000 Units Oral Q7 days   Continuous Infusions:   LOS: 10 days    Time spent: over 30 min    Fayrene Helper, MD [facility]   To contact the attending provider between 7A-7P or the covering provider during after hours 7P-7A, please log into the web site [email or web address] and access using universal [place] password for that web site. If you do not have the password, please call the hospital operator.  12/18/2022, 4:56 PM

## 2022-12-18 NOTE — Care Management Important Message (Signed)
Important Message  Patient Details  Name: [name] MRN: [id number] Date of Birth: 1941-04-15   Medicare Important Message Given:  Yes     [name] 12/18/2022, 11:00 AM

## 2022-12-18 NOTE — Progress Notes (Signed)
Patient ID: [name], female   DOB: [birth year], 82 y.o.   MRN: [id number] [facility] Progress Note   Assessment/ Plan:   1. Acute kidney Injury on chronic kidney disease stage IV: Suspect to be hemodynamically mediated in the setting of CHF exacerbation and urinary tract infection.  Renal function relatively stable overnight.  Will decrease IV furosemide to 80 mg TID and plan transitioning to oral furosemide vs torsemide tomorrow. 2.  Hypokalemia: Mild and secondary to ongoing diuretic use, replaced via oral route. 3.  Klebsiella urinary tract infection: Status post completion of antimicrobial therapy. 4.  Acute exacerbation of diastolic heart failure: Ongoing diuresis with improved urine output lower weight from this AM. Continues to show good clinical improvement with diuresis.  5.  Hypocalcemia/hyperphosphatemia: This appears to be likely associated with secondary hyperparathyroidism and is improving with ongoing replacement/phosphorus binders.  Subjective:   Reports continued improvement of shortness of breath and feels that she is stronger with PT.    Objective:   BP (!) 147/62   Pulse 86   Temp (!) 97.3 F (36.3 C) (Oral)   Resp 16   Ht '5\' 2"'$  (1.575 m)   Wt 73 kg   SpO2 98%   BMI 29.43 kg/m   Intake/Output Summary (Last 24 hours) at 12/18/2022 1106 Last data filed at 12/18/2022 0900 Gross per 24 hour  Intake --  Output 2700 ml  Net -2700 ml   Weight change:   Physical Exam: Gen: Comfortably resting flat in bed- not on oxygen CVS: Pulse regular rhythm, normal rate, S1 and S2 normal Resp: Fine rales right lung base with no rales on left, no wheeze/rhonchi Abd: Soft, flat, nontender, bowel sounds normal Ext: Trace-1+ pitting edema  Imaging: No results found.  Labs: BMET Recent Labs  Lab 12/12/22 0450 12/13/22 0500 12/14/22 0447 12/15/22 0534 12/16/22 0535 12/17/22 0503 12/18/22 0622  NA 140 141 139 139 140 140 140  K 3.6 3.5 3.5 3.4* 3.7  3.8 3.8  CL 104 107 104 102 103 101 99  CO2 '24 22 25 27 28 27 '$ 32  GLUCOSE 258* 198* 177* 176* 140* 121* 269*  BUN 72* 69* 69* 69* 67* 67* 69*  CREATININE 3.07* 2.87* 2.85* 2.80* 2.77* 2.36* 2.55*  CALCIUM 6.8* 6.6* 6.7* 6.5* 6.9* 7.0* 7.2*  PHOS 5.2* 4.8* 4.5 4.2 3.7 3.7 3.7   CBC Recent Labs  Lab 12/13/22 0500 12/13/22 2000 12/14/22 0447 12/16/22 0535 12/18/22 0622  WBC 4.1  --  4.8 6.8 7.4  HGB 6.9* 8.6* 8.5* 8.3* 8.4*  HCT 21.6* 26.1* 27.0* 25.5* 26.6*  MCV 95.6  --  94.7 95.5 97.4  PLT 108*  --  155 153 160    Medications:     apixaban  2.5 mg Oral BID   atorvastatin  40 mg Oral Daily   calcium acetate  1,334 mg Oral TID WC   Chlorhexidine Gluconate Cloth  6 each Topical Daily   clopidogrel  75 mg Oral QPM   furosemide  80 mg Intravenous Q8H   insulin aspart  0-9 Units Subcutaneous TID WC   leptospermum manuka honey  1 Application Topical Daily   letrozole  2.5 mg Oral Daily   levETIRAcetam  500 mg Oral BID   mupirocin ointment   Topical Daily   senna-docusate  1 tablet Oral BID   sodium bicarbonate  650 mg Oral TID   Vitamin D (Ergocalciferol)  50,000 Units Oral Q7 days   Elmarie Shiley, MD 12/18/2022, 11:06  AM  

## 2022-12-18 NOTE — Progress Notes (Signed)
C/O constipation. Sorbitol and soap suds enema given with moderate result of soft Gettel stool.

## 2022-12-18 NOTE — Progress Notes (Signed)
Physical Therapy Treatment Patient Details Name: [name] MRN: [id number] DOB: [birth year] Today's Date: 12/18/2022   History of Present Illness 82 y.o. female admitted to [facility] on 12/06/2022 with suspected acute cystitis after presenting from home to [facility] ED complaining of generalized weakness and fall. PMH: AML, breast cancer, paroxysmal atrial fibrillation chronically anticoagulated on Eliquis, chronic diastolic heart failure, type 2 diabetes mellitus, CKD 4 associated baseline creatinine 1.8-3.0, anemia of chronic disease associated baseline hemoglobin 8-10,    PT Comments    Pt reports that she just got back to bed but that she knows that she hasn't been able to work with therapy the past couple days and does not want to refuse. Pt continues to be limited in safe mobility by L foot pain in presence of generalized weakness. Pt is supervision for bed mobility, and min guard for transfers and in room ambulation with RW. D/c plans remain appropriate. PT will continue to follow acutely.    Recommendations for follow up therapy are one component of a multi-disciplinary discharge planning process, led by the attending physician.  Recommendations may be updated based on patient status, additional functional criteria and insurance authorization.  Follow Up Recommendations  Home health PT     Assistance Recommended at Discharge Intermittent Supervision/Assistance  Patient can return home with the following A little help with walking and/or transfers;A little help with bathing/dressing/bathroom;Assistance with cooking/housework;Direct supervision/assist for medications management;Direct supervision/assist for financial management;Assist for transportation;Help with stairs or ramp for entrance   Equipment Recommendations  None recommended by PT       Precautions / Restrictions Precautions Precautions: Fall Restrictions LLE Weight Bearing: Weight bearing as tolerated      Mobility  Bed Mobility Overal bed mobility: Needs Assistance Bed Mobility: Sidelying to Sit, Sit to Sidelying   Sidelying to sit: Supervision     Sit to sidelying: Supervision General bed mobility comments: +rail from flattened bed    Transfers Overall transfer level: Needs assistance Equipment used: Rolling walker (2 wheels) Transfers: Sit to/from Stand Sit to Stand: Min guard, From elevated surface           General transfer comment: good power up and self steadying    Ambulation/Gait Ambulation/Gait assistance: Min guard Gait Distance (Feet): 20 Feet Assistive device: Rolling walker (2 wheels) Gait Pattern/deviations: Step-through pattern, Decreased stride length, Trunk flexed Gait velocity: decreased Gait velocity interpretation: <1.31 ft/sec, indicative of household ambulator   General Gait Details: Distance limited by fatigue and L foot pain. decreased weight shift to L         Balance Overall balance assessment: Needs assistance Sitting-balance support: No upper extremity supported, Feet supported Sitting balance-Leahy Scale: Good     Standing balance support: Bilateral upper extremity supported, Reliant on assistive device for balance, During functional activity Standing balance-Leahy Scale: Poor Standing balance comment: requires B UE support                            Cognition Arousal/Alertness: Awake/alert Behavior During Therapy: WFL for tasks assessed/performed Overall Cognitive Status: Within Functional Limits for tasks assessed                                             General Comments General comments (skin integrity, edema, etc.): VSS on RA      Pertinent Vitals/Pain  Pain Assessment Pain Assessment: 0-10 Pain Score: 5  Pain Location: L foot Pain Descriptors / Indicators: Discomfort, Guarding, Grimacing     PT Goals (current goals can now be found in the care plan section) Acute Rehab PT  Goals Patient Stated Goal: home PT Goal Formulation: With patient Time For Goal Achievement: 12/22/22 Potential to Achieve Goals: Fair Progress towards PT goals: Progressing toward goals    Frequency    Min 3X/week      PT Plan Current plan remains appropriate       AM-PAC PT "6 Clicks" Mobility   Outcome Measure  Help needed turning from your back to your side while in a flat bed without using bedrails?: None Help needed moving from lying on your back to sitting on the side of a flat bed without using bedrails?: None Help needed moving to and from a bed to a chair (including a wheelchair)?: A Little Help needed standing up from a chair using your arms (e.g., wheelchair or bedside chair)?: A Little Help needed to walk in hospital room?: A Little Help needed climbing 3-5 steps with a railing? : A Lot 6 Click Score: 19    End of Session Equipment Utilized During Treatment: Gait belt Activity Tolerance: Patient tolerated treatment well Patient left: in bed;with call bell/phone within reach;with bed alarm set Nurse Communication: Mobility status PT Visit Diagnosis: Unsteadiness on feet (R26.81);Other abnormalities of gait and mobility (R26.89);Muscle weakness (generalized) (M62.81);Difficulty in walking, not elsewhere classified (R26.2);Pain Pain - Right/Left: Left Pain - part of body: Ankle and joints of foot     Time: 7341-9379 PT Time Calculation (min) (ACUTE ONLY): 23 min  Charges:  $Gait Training: 8-22 mins $Therapeutic Activity: 8-22 mins                     [name] B. Migdalia Dk PT, DPT Acute Rehabilitation Services Please use secure chat or  Call Office [phone]    [place] 12/18/2022, 3:30 PM

## 2022-12-18 NOTE — Progress Notes (Signed)
Mobility Specialist - Progress Note   12/18/22 0821  Mobility  Activity Transferred to/from [facility]  Level of Assistance Minimal assist, patient does 75% or more  Assistive Device Other (Comment) (bsc arms)  Activity Response Tolerated well  Mobility Referral Yes  $Mobility charge 1 Mobility   Pt received EOB needing assistance to [facility]. Pt was MinA throughout transfer. Pt was left on [facility] with all needs met.   Franki Monte  Mobility Specialist Please contact via Solicitor or Rehab office at [phone]

## 2022-12-19 DIAGNOSIS — N3000 Acute cystitis without hematuria: Secondary | ICD-10-CM | POA: Diagnosis not present

## 2022-12-19 LAB — CBC WITH DIFFERENTIAL/PLATELET
Abs Immature Granulocytes: 0.05 10*3/uL (ref 0.00–0.07)
Basophils Absolute: 0 10*3/uL (ref 0.0–0.1)
Basophils Relative: 0 %
Eosinophils Absolute: 0.1 10*3/uL (ref 0.0–0.5)
Eosinophils Relative: 1 %
HCT: 27.4 % — ABNORMAL LOW (ref 36.0–46.0)
Hemoglobin: 8.8 g/dL — ABNORMAL LOW (ref 12.0–15.0)
Immature Granulocytes: 1 %
Lymphocytes Relative: 14 %
Lymphs Abs: 1.2 10*3/uL (ref 0.7–4.0)
MCH: 30.8 pg (ref 26.0–34.0)
MCHC: 32.1 g/dL (ref 30.0–36.0)
MCV: 95.8 fL (ref 80.0–100.0)
Monocytes Absolute: 0.6 10*3/uL (ref 0.1–1.0)
Monocytes Relative: 6 %
Neutro Abs: 6.9 10*3/uL (ref 1.7–7.7)
Neutrophils Relative %: 78 %
Platelets: 157 10*3/uL (ref 150–400)
RBC: 2.86 MIL/uL — ABNORMAL LOW (ref 3.87–5.11)
RDW: 14.4 % (ref 11.5–15.5)
WBC: 8.9 10*3/uL (ref 4.0–10.5)
nRBC: 0 % (ref 0.0–0.2)

## 2022-12-19 LAB — COMPREHENSIVE METABOLIC PANEL
ALT: 11 U/L (ref 0–44)
AST: 17 U/L (ref 15–41)
Albumin: 2.5 g/dL — ABNORMAL LOW (ref 3.5–5.0)
Alkaline Phosphatase: 108 U/L (ref 38–126)
Anion gap: 10 (ref 5–15)
BUN: 67 mg/dL — ABNORMAL HIGH (ref 8–23)
CO2: 33 mmol/L — ABNORMAL HIGH (ref 22–32)
Calcium: 7.2 mg/dL — ABNORMAL LOW (ref 8.9–10.3)
Chloride: 96 mmol/L — ABNORMAL LOW (ref 98–111)
Creatinine, Ser: 2.53 mg/dL — ABNORMAL HIGH (ref 0.44–1.00)
GFR, Estimated: 19 mL/min — ABNORMAL LOW (ref >60)
Glucose, Bld: 172 mg/dL — ABNORMAL HIGH (ref 70–99)
Potassium: 3.6 mmol/L (ref 3.5–5.1)
Sodium: 139 mmol/L (ref 135–145)
Total Bilirubin: 0.6 mg/dL (ref 0.3–1.2)
Total Protein: 5.4 g/dL — ABNORMAL LOW (ref 6.5–8.1)

## 2022-12-19 LAB — GLUCOSE, CAPILLARY
Glucose-Capillary: 184 mg/dL — ABNORMAL HIGH (ref 70–99)
Glucose-Capillary: 106 mg/dL — ABNORMAL HIGH (ref 70–99)
Glucose-Capillary: 209 mg/dL — ABNORMAL HIGH (ref 70–99)
Glucose-Capillary: 116 mg/dL — ABNORMAL HIGH (ref 70–99)

## 2022-12-19 LAB — MAGNESIUM: Magnesium: 1.8 mg/dL (ref 1.7–2.4)

## 2022-12-19 LAB — PHOSPHORUS: Phosphorus: 3.3 mg/dL (ref 2.5–4.6)

## 2022-12-19 MED ORDER — FUROSEMIDE 40 MG PO TABS
40.0000 mg | ORAL_TABLET | Freq: Every day | ORAL | Status: DC
  Administered 2022-12-20 – 2022-12-26 (×6): 40 mg via ORAL
  Filled 2022-12-19 (×6): qty 1

## 2022-12-19 MED ORDER — BISACODYL 10 MG RE SUPP: 10.0000 mg | Freq: Every day | RECTAL | Status: DC | PRN

## 2022-12-19 NOTE — Progress Notes (Signed)
PT experienced a 6 beat run of V-Tach. PT did not feel episode and offers no new complaints. Rhythm strip saved to EPIC.

## 2022-12-19 NOTE — Progress Notes (Signed)
Patient ID: [name], female   DOB: 27-Feb-1941, 82 y.o.   MRN: [id number] [facility] Progress Note   Assessment/ Plan:   1. Acute kidney Injury on chronic kidney disease stage IV: Suspect to be hemodynamically mediated in the setting of CHF exacerbation and urinary tract infection.  Renal function remained stable and she appears to be sufficiently volume unloaded with intravenous diuresis (based on physical exam and worsening metabolic alkalosis).  Will discontinue intravenous furosemide (she already had 80 mg IV given today) and begin furosemide 40 mg tomorrow.  If labs appear stable tomorrow morning, she may potentially be discharged home in time for her birthday. 2.  Hypokalemia: Secondary to increased diuresis, this has been corrected with oral replacement.  Discontinue sodium bicarbonate.  3.  Klebsiella urinary tract infection: Status post completion of antimicrobial therapy. 4.  Acute exacerbation of diastolic heart failure: Improved with diuresis and will transition to oral diuretics. 5.  Hypocalcemia/hyperphosphatemia: This appears to be likely associated with secondary hyperparathyroidism and is improving with ongoing replacement/phosphorus binders.  Subjective:   Reports to be feeling better with significant improvement of shortness of breath and feeling less tight in her abdomen.   Objective:   BP (!) 118/48 (BP Location: Left Arm)   Pulse 83   Temp (!) 97.2 F (36.2 C) (Oral)   Resp 18   Ht '5\' 2"'$  (1.575 m)   Wt 71.7 kg   SpO2 98%   BMI 28.90 kg/m   Intake/Output Summary (Last 24 hours) at 12/19/2022 0921 Last data filed at 12/19/2022 0500 Gross per 24 hour  Intake 717 ml  Output 1500 ml  Net -783 ml   Weight change: -1.315 kg  Physical Exam: Gen: Comfortably sitting up on the edge of her bed eating breakfast CVS: Pulse regular rhythm, normal rate, S1 and S2 normal Resp: Fine rales right lung base with no rales on left, no wheeze/rhonchi Abd: Soft,  flat, nontender, bowel sounds normal Ext: Trace-1+ pitting edema  Imaging: No results found.  Labs: BMET Recent Labs  Lab 12/13/22 0500 12/14/22 0447 12/15/22 0534 12/16/22 0535 12/17/22 0503 12/18/22 0622 12/19/22 0303  NA 141 139 139 140 140 140 139  K 3.5 3.5 3.4* 3.7 3.8 3.8 3.6  CL 107 104 102 103 101 99 96*  CO2 '22 25 27 28 27 '$ 32 33*  GLUCOSE 198* 177* 176* 140* 121* 269* 172*  BUN 69* 69* 69* 67* 67* 69* 67*  CREATININE 2.87* 2.85* 2.80* 2.77* 2.36* 2.55* 2.53*  CALCIUM 6.6* 6.7* 6.5* 6.9* 7.0* 7.2* 7.2*  PHOS 4.8* 4.5 4.2 3.7 3.7 3.7 3.3   CBC Recent Labs  Lab 12/14/22 0447 12/16/22 0535 12/18/22 0622 12/19/22 0303  WBC 4.8 6.8 7.4 8.9  NEUTROABS  --   --   --  6.9  HGB 8.5* 8.3* 8.4* 8.8*  HCT 27.0* 25.5* 26.6* 27.4*  MCV 94.7 95.5 97.4 95.8  PLT 155 153 160 157    Medications:     apixaban  2.5 mg Oral BID   atorvastatin  40 mg Oral Daily   calcium acetate  1,334 mg Oral TID WC   Chlorhexidine Gluconate Cloth  6 each Topical Daily   clopidogrel  75 mg Oral QPM   [START ON 12/20/2022] furosemide  40 mg Oral Daily   insulin aspart  0-9 Units Subcutaneous TID WC   leptospermum manuka honey  1 Application Topical Daily   letrozole  2.5 mg Oral Daily   levETIRAcetam  500 mg  Oral BID   mupirocin ointment   Topical Daily   polyethylene glycol  17 g Oral BID   senna-docusate  1 tablet Oral BID   sodium bicarbonate  650 mg Oral TID   Vitamin D (Ergocalciferol)  50,000 Units Oral Q7 days   Elmarie Shiley, MD 12/19/2022, 9:21 AM

## 2022-12-19 NOTE — Progress Notes (Signed)
PROGRESS NOTE    [name]  JSE:[id number] DOB: Nov 28, 1941 DOA: 12/06/2022 PCP: [name], Catherine Halsted, MD  Chief Complaint  Patient presents with   Weakness    Brief Narrative:  [name] is [name] 82 y.o. female with [name] history of AML, breast cancer, paroxysmal atrial fibrillation on Eliquis, chronic diastolic heart failure, diabetes mellitus type 2, CKD stage IV. Patient presented secondary to generalized weakness and found to have evidence of UTI, hypocalcemia and acute heart failure. Patient started on calcium replacement, empiric Ceftriaxone and given Lasix. Associated hyperphosphatemia.    With supplementation of calcium and vitamin D, levels have improved. UTI treated. Renal function remains poor and patient developing increasing volume overload, so IV lasix initiated. Nephrology assisting.  Assessment & Plan:   Principal Problem:   Acute cystitis Active Problems:   Paroxysmal atrial fibrillation (HCC)   Hyperlipidemia   Anemia of chronic disease   CKD (chronic kidney disease) stage 4, GFR 15-29 ml/min (HCC)   DM2 (diabetes mellitus, type 2) (HCC)   Acute on chronic diastolic CHF (congestive heart failure) (HCC)   Generalized weakness   Hypocalcemia   History of seizures  Left foot pain: Has wound on great toe after dropping [name] jar of pennies on it. No cellulitis is evident at this time. No fracture on radiographs. Toe is dusky but warm.  - Local wound care per WOC - Continue unna boots - ABI normal on left.  - WBAT L foot, can use postop shoe if desired.    Acute cystitis: Urine culture significant for Klebsiella pneumoniae, resistant to ampicillin and nitrofurantoin.   - Completed ceftriaxone, symptoms resolved.   Generalized weakness: Noted on admission. Possibly related to acute illness however, possibly related to hypocalcemia.  - PT/OT recommend home health on discharge.   Acute on chronic diastolic heart failure Present on admission per history. BNP of  849.4. Patient given Lasix 20 mg IV on admission. Lasix held subsequently and received IV bicarbonate for [name] while. Overall is increasingly volume up. Weight currently 75.5kg, up from admission when it was 155 lbs (70.7kg). EDW thought to be near 63.3kg at last CHF discharge.  - Appreciate nephrology assistance with diuresis.Transitioned to PO today  Possible discharge tomorrow? - Continue strict I/O, daily weights.  - High suspicion for third spacing related to hypoalbuminemia.  - DNR status is confirmed but ongoing goals of care discussions would be helpful. Palliative care consulted, whose assistance is appreciated.   Hypocalcemia: Patient with associated hyperphosphatemia. Likely related to renal impairment.  - Remains low, though overall improved and not severe nor symptomatic. - Continue calcium acetate w/hyperphosphatemia.  - Vitamin D deficiency, level is < 20, supplement 50k units weekly started - PTH 307 consistent with hyperparathyroidism/MBD secondary to vitamin D deficiency and renal failure.   AKI on CKD stage IV: Baseline creatinine of about 2 with more recent creatinine of 4.33. Creatinine of 3.03 on admission and stable-to-slightly-improved since that time. Patient appears overloaded, although she was hypotensive on admission. Renal ultrasound unremarkable.  - Appreciate nephrology assistance.  - Repeat metabolic panel daily.    Anemia of CKD: s/p 1u PRBC 1/6.  - Monitor hgb, up as anticipated.   Hyperphosphatemia: Likely secondary to kidney disease. Discussed with nephrology who recommended transition to calcium acetate.  - Calcium acetate - Renal diet   Hypomagnesemia - Improved. Will continue monitoring.   Metabolic acidosis: Due to renal impairment - Improved, will continue bicarbonate tablets.   Constipation:  - Senokot daily,  sorbitol prn. Continue this to limit further impaction - enema reordered 1/11   Hyperlipidemia - Continue atorvastatin   T2DM:  Chronically uncontrolled with hyperglycemia (HbA1c 9.9%) acutely hypoglycemic on 1/1.  - Continue SSI (stopped basal) - HHRN will be ordered at DC (as well as PT/OT/aide)   Paroxysmal atrial fibrillation -Continue Eliquis   History of seizures - Continue Keppra    Abnormal right ABI: Noncompressible arteries, abnormal TBI of 0.26.  - Outpatient vascular surgery referral.    AML: In remission. Patient follows with [name] as an outpatient.   CAD: Patient with [name] history of NSTEMI, treated medically.  - Continue plavix, atorvastatin - consider restarting BB, imdur    DVT prophylaxis: eliquis Code Status: dnr Family Communication: none Disposition:   Status is: Inpatient Remains inpatient appropriate because: ongoing diuresis   Consultants:  Renal  palliative  Procedures:  Summary:  Right: Resting right ankle-brachial index indicates noncompressible right  lower extremity arteries. The right toe-brachial index is abnormal.   Left: Resting left ankle-brachial index is within normal range.   Unable to obtain TBI due to great toe wound, and absent waveforms.   Antimicrobials:  Anti-infectives (From admission, onward)    Start     Dose/Rate Route Frequency Ordered Stop   12/07/22 2200  cefTRIAXone (ROCEPHIN) 1 g in sodium chloride 0.9 % 100 mL IVPB  Status:  Discontinued        1 g 200 mL/hr over 30 Minutes Intravenous Every 24 hours 12/07/22 0301 12/09/22 1220   12/07/22 0145  cefTRIAXone (ROCEPHIN) 2 g in sodium chloride 0.9 % 100 mL IVPB        2 g 200 mL/hr over 30 Minutes Intravenous  Once 12/07/22 0136 12/07/22 0540       Subjective: Denied any new complaints  Objective: Vitals:   12/18/22 2023 12/19/22 0532 12/19/22 0737 12/19/22 1158  BP: (!) 141/51 (!) 143/67 (!) 118/48 138/70  Pulse: 91 89 83 90  Resp: '20 19 18 18  '$ Temp: (!) 97.5 F (36.4 C) 98.2 F (36.8 C) (!) 97.2 F (36.2 C) (!) 97.3 F (36.3 C)  TempSrc: Oral Oral Oral Oral  SpO2: 94%  95% 98% 90%  Weight:  71.7 kg    Height:        Intake/Output Summary (Last 24 hours) at 12/19/2022 1636 Last data filed at 12/19/2022 0500 Gross per 24 hour  Intake 477 ml  Output 1500 ml  Net -1023 ml   Filed Weights   12/16/22 0235 12/18/22 0448 12/19/22 0532  Weight: 75.5 kg 73 kg 71.7 kg    Examination:  General: No acute distress. Cardiovascular: RRR Lungs: unlabored Abdomen: Soft, nontender, nondistended Neurological: Alert and oriented 3. Moves all extremities 4. Cranial nerves II through XII grossly intact. Extremities: bilateral LE, dressing to LE   Data Reviewed: I have personally reviewed following labs and imaging studies  CBC: Recent Labs  Lab 12/13/22 0500 12/13/22 2000 12/14/22 0447 12/16/22 0535 12/18/22 0622 12/19/22 0303  WBC 4.1  --  4.8 6.8 7.4 8.9  NEUTROABS  --   --   --   --   --  6.9  HGB 6.9* 8.6* 8.5* 8.3* 8.4* 8.8*  HCT 21.6* 26.1* 27.0* 25.5* 26.6* 27.4*  MCV 95.6  --  94.7 95.5 97.4 95.8  PLT 108*  --  155 153 160 657    Basic Metabolic Panel: Recent Labs  Lab 12/15/22 0534 12/16/22 0535 12/17/22 0503 12/18/22 0622 12/19/22 0303  NA 139  140 140 140 139  K 3.4* 3.7 3.8 3.8 3.6  CL 102 103 101 99 96*  CO2 '27 28 27 '$ 32 33*  GLUCOSE 176* 140* 121* 269* 172*  BUN 69* 67* 67* 69* 67*  CREATININE 2.80* 2.77* 2.36* 2.55* 2.53*  CALCIUM 6.5* 6.9* 7.0* 7.2* 7.2*  MG 1.8 2.0 1.9 2.0 1.8  PHOS 4.2 3.7 3.7 3.7 3.3    GFR: Estimated Creatinine Clearance: 16.4 mL/min (Genette Huertas) (by C-G formula based on SCr of 2.53 mg/dL (H)).  Liver Function Tests: Recent Labs  Lab 12/15/22 0534 12/16/22 0535 12/17/22 0503 12/18/22 0622 12/19/22 0303  AST  --   --   --   --  17  ALT  --   --   --   --  11  ALKPHOS  --   --   --   --  108  BILITOT  --   --   --   --  0.6  PROT  --   --   --   --  5.4*  ALBUMIN 2.2* 2.3* 2.4* 2.4* 2.5*    CBG: Recent Labs  Lab 12/18/22 1222 12/18/22 1634 12/18/22 2110 12/19/22 0732 12/19/22 1154  GLUCAP  122* 172* 175* 184* 106*     No results found for this or any previous visit (from the past 240 hour(s)).       Radiology Studies: No results found.      Scheduled Meds:  apixaban  2.5 mg Oral BID   atorvastatin  40 mg Oral Daily   calcium acetate  1,334 mg Oral TID WC   clopidogrel  75 mg Oral QPM   [START ON 12/20/2022] furosemide  40 mg Oral Daily   insulin aspart  0-9 Units Subcutaneous TID WC   leptospermum manuka honey  1 Application Topical Daily   letrozole  2.5 mg Oral Daily   levETIRAcetam  500 mg Oral BID   mupirocin ointment   Topical Daily   polyethylene glycol  17 g Oral BID   senna-docusate  1 tablet Oral BID   Vitamin D (Ergocalciferol)  50,000 Units Oral Q7 days   Continuous Infusions:   LOS: 11 days    Time spent: over 30 min    Fayrene Helper, MD [facility]   To contact the attending provider between 7A-7P or the covering provider during after hours 7P-7A, please log into the web site [email or web address] and access using universal [place] password for that web site. If you do not have the password, please call the hospital operator.  12/19/2022, 4:36 PM

## 2022-12-19 NOTE — Progress Notes (Signed)
Occupational Therapy Treatment Patient Details Name: [name] MRN: [id number] DOB: 1941/11/30 Today's Date: 12/19/2022   History of present illness 82 y.o. female admitted to [facility] on 12/06/2022 with suspected acute cystitis after presenting from home to [facility] ED complaining of generalized weakness and fall. PMH: AML, breast cancer, paroxysmal atrial fibrillation chronically anticoagulated on Eliquis, chronic diastolic heart failure, type 2 diabetes mellitus, CKD 4 associated baseline creatinine 1.8-3.0, anemia of chronic disease associated baseline hemoglobin 8-10,   OT comments  Pt. Seen for skilled OT treatment session.  Pt. Required max a for lb dressing secondary to care needed to get socks over B dressings on each foot.  Pt. Also reports she usually uses AE for LB ADLs at home.  Safe tech. For stand pivot to/from bsc. No safety concerns or lob noted.  Pt. Requests bed vs chair at end of session.  Cont. With current acute OT POC.  Agree with current d/c recommendations.     Recommendations for follow up therapy are one component of a multi-disciplinary discharge planning process, led by the attending physician.  Recommendations may be updated based on patient status, additional functional criteria and insurance authorization.    Follow Up Recommendations  Home health OT     Assistance Recommended at Discharge Set up Supervision/Assistance  Patient can return home with the following  A little help with walking and/or transfers;Assistance with cooking/housework;Assist for transportation;Help with stairs or ramp for entrance;Direct supervision/assist for medications management;Direct supervision/assist for financial management;A little help with bathing/dressing/bathroom   Equipment Recommendations  BSC/3in1    Recommendations for Other Services      Precautions / Restrictions Precautions Precautions: Fall Restrictions LLE Weight Bearing: Weight bearing as tolerated        Mobility Bed Mobility Overal bed mobility: Needs Assistance Bed Mobility: Sidelying to Sit, Sit to Sidelying Rolling: Supervision Sidelying to sit: Supervision     Sit to sidelying: Supervision General bed mobility comments: +rail from flattened bed    Transfers Overall transfer level: Needs assistance Equipment used: None Transfers: Bed to chair/wheelchair/BSC Sit to Stand: Min guard, From elevated surface Stand pivot transfers: Min guard         General transfer comment: good power up and self steadying     Balance                                           ADL either performed or assessed with clinical judgement   ADL Overall ADL's : Needs assistance/impaired                     Lower Body Dressing: Maximal assistance;Sitting/lateral leans Lower Body Dressing Details (indicate cue type and reason): pt. reports she has AE at home that she uses for LB ADLs. describes a sock aide, reacher possibly, and leg lifter Toilet Transfer: Min guard;Cueing for sequencing;Stand-pivot;BSC/3in1 Toilet Transfer Details (indicate cue type and reason): eob to bsc and back to bed-good tech. no safety concerns noted, good hand placement           General ADL Comments: agreeable to lb dressing and toileting task, declined oob to chair secondary to stating chair was very uncomfortable    Extremity/Trunk Assessment              Vision       Perception     Praxis  Cognition Arousal/Alertness: Awake/alert Behavior During Therapy: WFL for tasks assessed/performed Overall Cognitive Status: Within Functional Limits for tasks assessed                                          Exercises      Shoulder Instructions       General Comments      Pertinent Vitals/ Pain       Pain Assessment Pain Assessment: Faces Faces Pain Scale: Hurts little more Pain Location: L foot Pain Descriptors / Indicators: Discomfort,  Guarding, Grimacing Pain Intervention(s): Limited activity within patient's tolerance, Monitored during session, Repositioned  Home Living                                          Prior Functioning/Environment              Frequency  Min 2X/week        Progress Toward Goals  OT Goals(current goals can now be found in the care plan section)  Progress towards OT goals: Progressing toward goals     Plan Discharge plan remains appropriate;Frequency remains appropriate    Co-evaluation                 AM-PAC OT "6 Clicks" Daily Activity     Outcome Measure   Help from another person eating meals?: None Help from another person taking care of personal grooming?: A Little Help from another person toileting, which includes using toliet, bedpan, or urinal?: A Lot Help from another person bathing (including washing, rinsing, drying)?: A Lot Help from another person to put on and taking off regular upper body clothing?: A Little Help from another person to put on and taking off regular lower body clothing?: A Lot 6 Click Score: 16    End of Session    OT Visit Diagnosis: Unsteadiness on feet (R26.81);Other abnormalities of gait and mobility (R26.89);Muscle weakness (generalized) (M62.81);History of falling (Z91.81)   Activity Tolerance Patient tolerated treatment well   Patient Left in bed;with call bell/phone within reach;with bed alarm set   Nurse Communication          Time: 8119-1478 OT Time Calculation (min): 10 min  Charges: OT General Charges $OT Visit: 1 Visit OT Treatments $Self Care/Home Management : 8-22 mins  [name], COTA/L Acute Rehabilitation [phone]   Clearnce Sorrel Lorraine-COTA/L 12/19/2022, 12:17 PM

## 2022-12-19 NOTE — TOC Progression Note (Signed)
Transition of Care [facility]) - Progression Note    Patient Details  Name: [name] MRN: [id number] Date of Birth: 1941-06-30  Transition of Care [facility]) CM/SW Contact  Graves-Bigelow, Ocie Cornfield, RN Phone Number: 12/19/2022, 2:01 PM  Clinical Narrative: Patient was discussed in morning progression rounds. Patient will have [employer] via [place] via [place] once stable to transition home. DME 3n1 was ordered via [employer] on 12-09-22. Case Manager did try to call the daughter to update her on possible transition home over the weekend- received voicemail. Case Manager will continue to follow for disposition needs.   Expected Discharge Plan: [place] Barriers to Discharge: Continued Medical Work up  Expected Discharge Plan and Services In-house Referral: NA Discharge Planning Services: CM Consult Post Acute Care Choice: [place] arrangements for the past 2 months: Single Family Home                 DME Arranged: Bedside commode DME Agency: [employer] Date DME Agency Contacted: 12/09/22 Time DME Agency Contacted: [phone] Representative spoke with at DME Agency: parachute [place]: RN, Disease Management, PT, OT, Nurse's Aide [place] Agency: [place] Date [place]: 12/09/22 Time [place]: 1636 Representative spoke with at [place]: Nadine ([place]) Interventions [place]: No Food Insecurity (12/08/2022)  Housing: Low Risk  (12/08/2022)  Transportation Needs: No Transportation Needs (12/08/2022)  Utilities: Not At Risk (12/08/2022)  Alcohol Screen: Low Risk  (10/22/2022)  Depression (PHQ2-9): Low Risk  (10/03/2022)  Financial Resource Strain: Low Risk  (10/22/2022)  Stress: No Stress Concern Present (05/12/2022)  Tobacco Use: Medium Risk (12/07/2022)    Readmission Risk Interventions    12/09/2022    4:35 PM  Readmission Risk Prevention Plan  Transportation  Screening Complete  Medication Review (RN Care Manager) Complete  HRI or [place] Complete  SW Recovery Care/Counseling Consult Complete  [place] Not Applicable

## 2022-12-20 DIAGNOSIS — N3000 Acute cystitis without hematuria: Secondary | ICD-10-CM | POA: Diagnosis not present

## 2022-12-20 DIAGNOSIS — I5032 Chronic diastolic (congestive) heart failure: Secondary | ICD-10-CM

## 2022-12-20 DIAGNOSIS — E13621 Other specified diabetes mellitus with foot ulcer: Secondary | ICD-10-CM

## 2022-12-20 DIAGNOSIS — I4891 Unspecified atrial fibrillation: Secondary | ICD-10-CM

## 2022-12-20 LAB — CBC WITH DIFFERENTIAL/PLATELET
Abs Immature Granulocytes: 0.03 10*3/uL (ref 0.00–0.07)
Basophils Absolute: 0 10*3/uL (ref 0.0–0.1)
Basophils Relative: 0 %
Eosinophils Absolute: 0.1 10*3/uL (ref 0.0–0.5)
Eosinophils Relative: 1 %
HCT: 25.9 % — ABNORMAL LOW (ref 36.0–46.0)
Hemoglobin: 8.3 g/dL — ABNORMAL LOW (ref 12.0–15.0)
Immature Granulocytes: 0 %
Lymphocytes Relative: 10 %
Lymphs Abs: 1 10*3/uL (ref 0.7–4.0)
MCH: 30.9 pg (ref 26.0–34.0)
MCHC: 32 g/dL (ref 30.0–36.0)
MCV: 96.3 fL (ref 80.0–100.0)
Monocytes Absolute: 0.5 10*3/uL (ref 0.1–1.0)
Monocytes Relative: 5 %
Neutro Abs: 8 10*3/uL — ABNORMAL HIGH (ref 1.7–7.7)
Neutrophils Relative %: 84 %
Platelets: 149 10*3/uL — ABNORMAL LOW (ref 150–400)
RBC: 2.69 MIL/uL — ABNORMAL LOW (ref 3.87–5.11)
RDW: 14.6 % (ref 11.5–15.5)
WBC: 9.6 10*3/uL (ref 4.0–10.5)
nRBC: 0 % (ref 0.0–0.2)

## 2022-12-20 LAB — MAGNESIUM: Magnesium: 1.9 mg/dL (ref 1.7–2.4)

## 2022-12-20 LAB — RENAL FUNCTION PANEL
Albumin: 2.4 g/dL — ABNORMAL LOW (ref 3.5–5.0)
Anion gap: 13 (ref 5–15)
BUN: 72 mg/dL — ABNORMAL HIGH (ref 8–23)
CO2: 32 mmol/L (ref 22–32)
Calcium: 7.1 mg/dL — ABNORMAL LOW (ref 8.9–10.3)
Chloride: 95 mmol/L — ABNORMAL LOW (ref 98–111)
Creatinine, Ser: 2.39 mg/dL — ABNORMAL HIGH (ref 0.44–1.00)
GFR, Estimated: 20 mL/min — ABNORMAL LOW (ref >60)
Glucose, Bld: 197 mg/dL — ABNORMAL HIGH (ref 70–99)
Phosphorus: 3.4 mg/dL (ref 2.5–4.6)
Potassium: 3.4 mmol/L — ABNORMAL LOW (ref 3.5–5.1)
Sodium: 140 mmol/L (ref 135–145)

## 2022-12-20 LAB — GLUCOSE, CAPILLARY
Glucose-Capillary: 207 mg/dL — ABNORMAL HIGH (ref 70–99)
Glucose-Capillary: 162 mg/dL — ABNORMAL HIGH (ref 70–99)
Glucose-Capillary: 130 mg/dL — ABNORMAL HIGH (ref 70–99)
Glucose-Capillary: 154 mg/dL — ABNORMAL HIGH (ref 70–99)

## 2022-12-20 MED ORDER — POTASSIUM CHLORIDE CRYS ER 20 MEQ PO TBCR
20.0000 meq | EXTENDED_RELEASE_TABLET | Freq: Two times a day (BID) | ORAL | Status: AC
  Administered 2022-12-20 – 2022-12-21 (×3): 20 meq via ORAL
  Filled 2022-12-20 (×3): qty 1

## 2022-12-20 MED ORDER — ISOSORBIDE MONONITRATE ER 30 MG PO TB24
30.0000 mg | ORAL_TABLET | Freq: Every day | ORAL | Status: DC
  Administered 2022-12-20 – 2022-12-26 (×7): 30 mg via ORAL
  Filled 2022-12-20 (×7): qty 1

## 2022-12-20 MED ORDER — METOPROLOL SUCCINATE ER 25 MG PO TB24
12.5000 mg | ORAL_TABLET | Freq: Every day | ORAL | Status: DC
  Administered 2022-12-20 – 2022-12-26 (×7): 12.5 mg via ORAL
  Filled 2022-12-20 (×8): qty 1

## 2022-12-20 NOTE — Progress Notes (Signed)
PROGRESS NOTE    [name]  WEX:[id number] DOB: [birth year] DOA: 12/06/2022 PCP: Isaac Bliss, Rayford Halsted, MD  Chief Complaint  Patient presents with   Weakness    Brief Narrative:  [name] is [name] 82 y.o. female with [name] history of AML, breast cancer, paroxysmal atrial fibrillation on Eliquis, chronic diastolic heart failure, diabetes mellitus type 2, CKD stage IV. Patient presented secondary to generalized weakness and found to have evidence of UTI, hypocalcemia and acute heart failure. Patient started on calcium replacement, empiric Ceftriaxone and given Lasix. Associated hyperphosphatemia.    With supplementation of calcium and vitamin D, levels have improved. UTI treated. Renal function remains poor and patient developing increasing volume overload, so IV lasix initiated. Nephrology assisting.  Assessment & Plan:   Principal Problem:   Acute cystitis Active Problems:   Paroxysmal atrial fibrillation (HCC)   Hyperlipidemia   Anemia of chronic disease   CKD (chronic kidney disease) stage 4, GFR 15-29 ml/min (HCC)   DM2 (diabetes mellitus, type 2) (HCC)   Acute on chronic diastolic CHF (congestive heart failure) (HCC)   Generalized weakness   Hypocalcemia   History of seizures  Left foot pain  Left Great Toe Wound: Has wound on great toe after dropping [name] jar of pennies on it. No cellulitis is evident at this time. No fracture on radiographs.  - Local wound care per WOC - ABI normal on left, unable to obtain TBI due to great toe wound/absent wave forms - L toe with dusky appearance, wound appears concerning - will ask podiatry to evaluate.  vascular involvement requested as well. - WBAT L foot, can use postop shoe if desired.    Acute cystitis: Urine culture significant for Klebsiella pneumoniae, resistant to ampicillin and nitrofurantoin.   - Completed ceftriaxone, symptoms resolved.   Generalized weakness: Noted on admission. Possibly related to acute illness  however, possibly related to hypocalcemia.  - PT/OT recommend home health on discharge.   Acute on chronic diastolic heart failure Present on admission per history. BNP of 849.4. Patient given Lasix 20 mg IV on admission. Lasix held subsequently and received IV bicarbonate for [name] while. Overall is increasingly volume up. Weight currently 75.5kg, up from admission when it was 155 lbs (70.7kg). EDW thought to be near 63.3kg at last CHF discharge.  - Appreciate nephrology assistance with diuresis.Transitioned to PO today (had dose of IV 1/12).  Possible discharge tomorrow? - Continue strict I/O, daily weights.  - High suspicion for third spacing related to hypoalbuminemia.  - DNR status is confirmed but ongoing goals of care discussions would be helpful. Palliative care consulted, whose assistance is appreciated.   Hypocalcemia: Patient with associated hyperphosphatemia. Likely related to renal impairment.  - Remains low, though overall improved and not severe nor symptomatic. - Continue calcium acetate w/hyperphosphatemia.  - Vitamin D deficiency, level is < 20, supplement 50k units weekly started - PTH 307 consistent with hyperparathyroidism/MBD secondary to vitamin D deficiency and renal failure.   AKI on CKD stage IV: Baseline creatinine of about 2 with more recent creatinine of 4.33. Creatinine of 3.03 on admission and stable-to-slightly-improved since that time. Patient appears overloaded, although she was hypotensive on admission. Renal ultrasound unremarkable.  - Appreciate nephrology assistance.  Follow on oral diuresis. - Repeat metabolic panel daily.    Anemia of CKD: s/p 1u PRBC 1/6.  - Monitor hgb, up as anticipated.   Hyperphosphatemia: Likely secondary to kidney disease. Discussed with nephrology who recommended transition to calcium acetate.  -  Calcium acetate - Renal diet   Hypomagnesemia - Improved. Will continue monitoring.   Metabolic acidosis: Due to renal impairment -  Improved, will continue bicarbonate tablets.   Constipation:  - Senokot daily, sorbitol prn. Continue this to limit further impaction - enema reordered 1/11   Hyperlipidemia - Continue atorvastatin   T2DM: Chronically uncontrolled with hyperglycemia (HbA1c 9.9%) acutely hypoglycemic on 1/1.  - Continue SSI (stopped basal) - HHRN will be ordered at DC (as well as PT/OT/aide)   Paroxysmal atrial fibrillation -Continue Eliquis   History of seizures - Continue Keppra    Abnormal right ABI: Noncompressible arteries, abnormal TBI of 0.26.  - Outpatient vascular surgery referral.    AML: In remission. Patient follows with Dr. [name] as an outpatient.   CAD: Patient with [name] history of NSTEMI, treated medically.  - Continue plavix, atorvastatin.  Also on eliquis. - restarting BB, imdur    DVT prophylaxis: eliquis Code Status: dnr Family Communication: none Disposition:   Status is: Inpatient Remains inpatient appropriate because: ongoing diuresis   Consultants:  Renal  palliative  Procedures:  Summary:  Right: Resting right ankle-brachial index indicates noncompressible right  lower extremity arteries. The right toe-brachial index is abnormal.   Left: Resting left ankle-brachial index is within normal range.   Unable to obtain TBI due to great toe wound, and absent waveforms.   Antimicrobials:  Anti-infectives (From admission, onward)    Start     Dose/Rate Route Frequency Ordered Stop   12/07/22 2200  cefTRIAXone (ROCEPHIN) 1 g in sodium chloride 0.9 % 100 mL IVPB  Status:  Discontinued        1 g 200 mL/hr over 30 Minutes Intravenous Every 24 hours 12/07/22 0301 12/09/22 1220   12/07/22 0145  cefTRIAXone (ROCEPHIN) 2 g in sodium chloride 0.9 % 100 mL IVPB        2 g 200 mL/hr over 30 Minutes Intravenous  Once 12/07/22 0136 12/07/22 0540       Subjective: No new complaints  Objective: Vitals:   12/20/22 0041 12/20/22 0618 12/20/22 0732 12/20/22 1135   BP: (!) 142/57 (!) 126/54 122/85 (!) 146/63  Pulse: (!) 106 88 91 84  Resp: '16 20 20 16  '$ Temp: 97.8 F (36.6 C) 97.9 F (36.6 C) 97.6 F (36.4 C) 97.6 F (36.4 C)  TempSrc: Axillary Axillary Oral Oral  SpO2:   90% (!) 88%  Weight:  70.4 kg    Height:        Intake/Output Summary (Last 24 hours) at 12/20/2022 1459 Last data filed at 12/20/2022 0350 Gross per 24 hour  Intake 110 ml  Output 700 ml  Net -590 ml   Filed Weights   12/18/22 0448 12/19/22 0532 12/20/22 0618  Weight: 73 kg 71.7 kg 70.4 kg    Examination:  General: No acute distress. Cardiovascular: RRR Lungs: unlabored Abdomen: Soft, nontender, nondistended Neurological: Alert and oriented 3. Moves all extremities 4. Cranial nerves II through XII grossly intact. Extremities: L first toe mottled, wound on dorsal aspect - intact sensation and motor function   Data Reviewed: I have personally reviewed following labs and imaging studies  CBC: Recent Labs  Lab 12/14/22 0447 12/16/22 0535 12/18/22 0622 12/19/22 0303 12/20/22 0639  WBC 4.8 6.8 7.4 8.9 9.6  NEUTROABS  --   --   --  6.9 8.0*  HGB 8.5* 8.3* 8.4* 8.8* 8.3*  HCT 27.0* 25.5* 26.6* 27.4* 25.9*  MCV 94.7 95.5 97.4 95.8 96.3  PLT 155 153  160 157 149*    Basic Metabolic Panel: Recent Labs  Lab 12/16/22 0535 12/17/22 0503 12/18/22 0622 12/19/22 0303 12/20/22 0639  NA 140 140 140 139 140  K 3.7 3.8 3.8 3.6 3.4*  CL 103 101 99 96* 95*  CO2 28 27 32 33* 32  GLUCOSE 140* 121* 269* 172* 197*  BUN 67* 67* 69* 67* 72*  CREATININE 2.77* 2.36* 2.55* 2.53* 2.39*  CALCIUM 6.9* 7.0* 7.2* 7.2* 7.1*  MG 2.0 1.9 2.0 1.8 1.9  PHOS 3.7 3.7 3.7 3.3 3.4    GFR: Estimated Creatinine Clearance: 17.2 mL/min (Ketsia Linebaugh) (by C-G formula based on SCr of 2.39 mg/dL (H)).  Liver Function Tests: Recent Labs  Lab 12/16/22 0535 12/17/22 0503 12/18/22 0622 12/19/22 0303 12/20/22 0639  AST  --   --   --  17  --   ALT  --   --   --  11  --   ALKPHOS  --   --    --  108  --   BILITOT  --   --   --  0.6  --   PROT  --   --   --  5.4*  --   ALBUMIN 2.3* 2.4* 2.4* 2.5* 2.4*    CBG: Recent Labs  Lab 12/19/22 1154 12/19/22 1646 12/19/22 2124 12/20/22 0731 12/20/22 1132  GLUCAP 106* 209* 116* 207* 162*     No results found for this or any previous visit (from the past 240 hour(s)).       Radiology Studies: No results found.      Scheduled Meds:  apixaban  2.5 mg Oral BID   atorvastatin  40 mg Oral Daily   calcium acetate  1,334 mg Oral TID WC   clopidogrel  75 mg Oral QPM   furosemide  40 mg Oral Daily   insulin aspart  0-9 Units Subcutaneous TID WC   leptospermum manuka honey  1 Application Topical Daily   letrozole  2.5 mg Oral Daily   levETIRAcetam  500 mg Oral BID   mupirocin ointment   Topical Daily   polyethylene glycol  17 g Oral BID   potassium chloride  20 mEq Oral BID   senna-docusate  1 tablet Oral BID   Vitamin D (Ergocalciferol)  50,000 Units Oral Q7 days   Continuous Infusions:   LOS: 12 days    Time spent: over 30 min    Fayrene Helper, MD [facility]   To contact the attending provider between 7A-7P or the covering provider during after hours 7P-7A, please log into the web site [email or web address] and access using universal  password for that web site. If you do not have the password, please call the hospital operator.  12/20/2022, 2:59 PM

## 2022-12-20 NOTE — Progress Notes (Signed)
Mobility Specialist - Progress Note   12/20/22 1255  Mobility  Activity Ambulated with assistance in room  Level of Assistance Minimal assist, patient does 75% or more  Assistive Device Front wheel walker  Distance Ambulated (ft) 20 ft  LLE Weight Bearing WBAT  Activity Response Tolerated well  Mobility Referral Yes  $Mobility charge 1 Mobility    Pre-mobility: 87 HR During mobility:112 HR Post-mobility:84 HR  Pt was received in bed and agreeable to mobility. Session was limited d/t LLE pain. Pt was returned to bed with all needs met.   Franki Monte  Mobility Specialist Please contact via Solicitor or Rehab office at [phone]

## 2022-12-20 NOTE — Progress Notes (Signed)
Patient ID: [name], female   DOB: 09/05/1941, 82 y.o.   MRN: [id number] [facility] Progress Note   Assessment/ Plan:   1. Acute kidney Injury on chronic kidney disease stage IV: Suspect to be hemodynamically mediated in the setting of CHF exacerbation and urinary tract infection.  Renal function stable to possibly slightly better overnight.  Transitioning from intravenous furosemide to oral furosemide today and likely be able to go home later today (on transient potassium supplementation) or tomorrow following verification of labs. 2.  Hypokalemia: Secondary to increased diuresis, oral replacement ordered.  3.  Klebsiella urinary tract infection: Status post completion of antimicrobial therapy. 4.  Acute exacerbation of diastolic heart failure: Appears now to be compensated, transitioned to oral diuretics starting today. 5.  Hypocalcemia: Continue calcium acetate supplementation with meals.  Subjective:   She reports that she continues to feel well and is getting stronger.   Objective:   BP 122/85 (BP Location: Left Arm)   Pulse 91   Temp 97.6 F (36.4 C) (Oral)   Resp 20   Ht '5\' 2"'$  (1.575 m)   Wt 70.4 kg   SpO2 90%   BMI 28.37 kg/m   Intake/Output Summary (Last 24 hours) at 12/20/2022 0909 Last data filed at 12/20/2022 2774 Gross per 24 hour  Intake 110 ml  Output 700 ml  Net -590 ml   Weight change: -1.315 kg  Physical Exam: Gen: Comfortably sitting up on the edge of her bed eating breakfast CVS: Pulse regular rhythm, normal rate, S1 and S2 normal Resp: Fine rales right lung base with no rales on left, no wheeze/rhonchi Abd: Soft, flat, nontender, bowel sounds normal Ext: Trace-1+ pitting edema  Imaging: No results found.  Labs: BMET Recent Labs  Lab 12/14/22 0447 12/15/22 0534 12/16/22 0535 12/17/22 0503 12/18/22 0622 12/19/22 0303 12/20/22 0639  NA 139 139 140 140 140 139 140  K 3.5 3.4* 3.7 3.8 3.8 3.6 3.4*  CL 104 102 103 101 99 96*  95*  CO2 '25 27 28 27 '$ 32 33* 32  GLUCOSE 177* 176* 140* 121* 269* 172* 197*  BUN 69* 69* 67* 67* 69* 67* 72*  CREATININE 2.85* 2.80* 2.77* 2.36* 2.55* 2.53* 2.39*  CALCIUM 6.7* 6.5* 6.9* 7.0* 7.2* 7.2* 7.1*  PHOS 4.5 4.2 3.7 3.7 3.7 3.3 3.4   CBC Recent Labs  Lab 12/16/22 0535 12/18/22 0622 12/19/22 0303 12/20/22 0639  WBC 6.8 7.4 8.9 9.6  NEUTROABS  --   --  6.9 8.0*  HGB 8.3* 8.4* 8.8* 8.3*  HCT 25.5* 26.6* 27.4* 25.9*  MCV 95.5 97.4 95.8 96.3  PLT 153 160 157 149*    Medications:     apixaban  2.5 mg Oral BID   atorvastatin  40 mg Oral Daily   calcium acetate  1,334 mg Oral TID WC   clopidogrel  75 mg Oral QPM   furosemide  40 mg Oral Daily   insulin aspart  0-9 Units Subcutaneous TID WC   leptospermum manuka honey  1 Application Topical Daily   letrozole  2.5 mg Oral Daily   levETIRAcetam  500 mg Oral BID   mupirocin ointment   Topical Daily   polyethylene glycol  17 g Oral BID   potassium chloride  20 mEq Oral BID   senna-docusate  1 tablet Oral BID   Vitamin D (Ergocalciferol)  50,000 Units Oral Q7 days   Elmarie Shiley, MD 12/20/2022, 9:09 AM

## 2022-12-20 NOTE — Consult Note (Signed)
Hospital Consult    Reason for Consult:  left great toe wound Referring Physician:  Hospitalist, Dr. Marcelline Deist MRN #:  [id number]  History of Present Illness: This is a 82 y.o. female with history of atrial fibrillation on Eliquis, chronic diastolic heart failure, diabetes type 2, chronic kidney disease stage IV that vascular surgery has been asked to evaluate a left great toe wound.  Apparently dropped a jar of pennies on it about 15 days ago prior to admission.  Concerned that it is dusky per the hospitalist.  She was admitted due to weakness found to have UTI and also acute kidney injury in the setting of stage IV CKD.  Also some exacerbation of her chronic diastolic heart failure and undergoing diuresis.  ABIs were obtained 12/11/2022 that were noncompressible on the right with a monophasic waveform and 0.95 on the left multiphasic with a flat toe tracing.  She states she is ambulatory with a walker.    Past Medical History:  Diagnosis Date   Acute myelogenous leukemia ([place]) 02/2014   Acute pancreatitis    Arthritis    "back" (09/17/2017)   Atrial flutter (HCC)    Cardiomyopathy (HCC)    CHF (congestive heart failure) (HCC)    Chronic kidney disease, stage II (mild)    Chronic lower back pain    Colon polyps 04/29/2010   TUBULAR ADENOMA AND A SERRATED ADENOMA   COPD (chronic obstructive pulmonary disease) ([place])    CORONARY ARTERY DISEASE 12/24/2007   DIABETES MELLITUS, TYPE II 07/13/2007   History of blood transfusion 2015   "related to leukemia"   History of kidney stones 12/24/2007   History of uterine cancer    HYPERLIPIDEMIA 12/24/2007   HYPERTENSION 07/13/2007   NSTEMI (non-ST elevated myocardial infarction) ([place]) 09/17/2017   Uterine cancer ([place])     Past Surgical History:  Procedure Laterality Date   ABDOMINAL HYSTERECTOMY     "still have my ovaries"   BREAST BIOPSY Left 09/22/2019   BREAST LUMPECTOMY Left 10/24/2019   BREAST LUMPECTOMY WITH RADIOACTIVE SEED  LOCALIZATION Left 10/24/2019   Procedure: LEFT BREAST LUMPECTOMY WITH RADIOACTIVE SEED LOCALIZATION;  Surgeon: Jovita Kussmaul, MD;  Location: [facility];  Service: General;  Laterality: Left;   CARDIAC CATHETERIZATION  2002   CARDIAC CATHETERIZATION  09/17/2017   CATARACT EXTRACTION W/ INTRAOCULAR LENS  IMPLANT, BILATERAL Bilateral    COLONOSCOPY W/ BIOPSIES AND POLYPECTOMY  2011   CORONARY STENT INTERVENTION N/A 09/21/2017   Procedure: CORONARY STENT INTERVENTION;  Surgeon: Troy Sine, MD;  Location: [facility];  Service: Cardiovascular;  Laterality: N/A;   DILATION AND CURETTAGE OF UTERUS     ESOPHAGOGASTRODUODENOSCOPY N/A 01/30/2022   Procedure: ESOPHAGOGASTRODUODENOSCOPY (EGD);  Surgeon: Milus Banister, MD;  Location: [facility];  Service: Endoscopy;  Laterality: N/A;   EUS N/A 01/30/2022   Procedure: UPPER ENDOSCOPIC ULTRASOUND (EUS) RADIAL;  Surgeon: Milus Banister, MD;  Location: [facility];  Service: Endoscopy;  Laterality: N/A;   EXCISIONAL HEMORRHOIDECTOMY  X 2   FINE NEEDLE ASPIRATION N/A 01/30/2022   Procedure: FINE NEEDLE ASPIRATION (FNA) LINEAR;  Surgeon: Milus Banister, MD;  Location: [facility];  Service: Endoscopy;  Laterality: N/A;   GANGLION CYST EXCISION Left X 3   INTRAMEDULLARY (IM) NAIL INTERTROCHANTERIC Right 08/30/2022   Procedure: INTRAMEDULLARY (IM) NAIL INTERTROCHANTERIC;  Surgeon: Altamese Fulton, MD;  Location: [facility];  Service: Orthopedics;  Laterality: Right;   INTRAVASCULAR PRESSURE WIRE/FFR STUDY N/A 09/17/2017   Procedure: INTRAVASCULAR PRESSURE  WIRE/FFR STUDY;  Surgeon: Nelva Bush, MD;  Location: [facility];  Service: Cardiovascular;  Laterality: N/A;   IR THORACENTESIS ASP PLEURAL SPACE W/IMG GUIDE  10/27/2022   NASAL SINUS SURGERY     PORTA CATH INSERTION Right 2013   RIGHT/LEFT HEART CATH AND CORONARY ANGIOGRAPHY N/A 09/17/2017   Procedure: RIGHT/LEFT HEART CATH AND CORONARY ANGIOGRAPHY;  Surgeon: Nelva Bush, MD;  Location: [facility];  Service: Cardiovascular;  Laterality: N/A;   TEE WITHOUT CARDIOVERSION N/A 11/19/2022   Procedure: TRANSESOPHAGEAL ECHOCARDIOGRAM (TEE);  Surgeon: Hebert Soho, DO;  Location: [facility];  Service: Cardiovascular;  Laterality: N/A;   TUBAL LIGATION      Allergies  Allergen Reactions   Chlorhexidine Gluconate Itching and Rash    Prior to Admission medications   Medication Sig Start Date End Date Taking? Authorizing Provider  clopidogrel (PLAVIX) 75 MG tablet Take 1 tablet (75 mg total) by mouth every evening. 09/30/22  Yes Angiulli, Lavon Paganini, PA-C  furosemide (LASIX) 40 MG tablet Take 40 mg by mouth daily.   Yes [provider]  HYDROcodone-acetaminophen (NORCO/VICODIN) 5-325 MG tablet Take 1 tablet by mouth every 6 (six) hours as needed for moderate pain or severe pain. 11/18/22  Yes [provider]  LORazepam (ATIVAN) 0.5 MG tablet Take 1 tablet (0.5 mg total) by mouth every 6 (six) hours as needed for anxiety. 09/30/22  Yes Angiulli, Lavon Paganini, PA-C  nitroGLYCERIN (NITROSTAT) 0.4 MG SL tablet Place 0.4 mg under the tongue every 5 (five) minutes as needed for chest pain. 11/08/22  Yes [provider]  apixaban (ELIQUIS) 2.5 MG TABS tablet Take 1 tablet (2.5 mg total) by mouth 2 (two) times daily. 09/30/22   Angiulli, Lavon Paganini, PA-C  ascorbic acid (VITAMIN C) 1000 MG tablet Take 1 tablet (1,000 mg total) by mouth daily. 09/30/22   Angiulli, Lavon Paganini, PA-C  atorvastatin (LIPITOR) 40 MG tablet Take 1 tablet (40 mg total) by mouth daily. 09/30/22   Angiulli, Lavon Paganini, PA-C  cyanocobalamin (VITAMIN B12) 1000 MCG/ML injection INJECT 1ML IN THE MUSCLE EVERY 30 DAYS AS DIRECTED Patient taking differently: Inject 1,000 mcg into the muscle every 30 (thirty) days. 08/28/22   Isaac Bliss, Rayford Halsted, MD  docusate sodium (COLACE) 100 MG capsule Take 1 capsule (100 mg total) by mouth 2 (two) times daily. 09/30/22   Angiulli,  Lavon Paganini, PA-C  fluticasone furoate-vilanterol (BREO ELLIPTA) 200-25 MCG/ACT AEPB Inhale 1 puff into the lungs as needed (wheezing SOB). 09/30/22   Angiulli, Lavon Paganini, PA-C  insulin glargine, 2 Unit Dial, (TOUJEO MAX SOLOSTAR) 300 UNIT/ML Solostar Pen Inject 6 Units into the skin at bedtime. Patient taking differently: Inject 10-16 Units into the skin See admin instructions. Inject 10 units into the skin at supper and 16 units at bedtime 09/05/22   Florencia Reasons, MD  isosorbide mononitrate (IMDUR) 30 MG 24 hr tablet Take 1 tablet (30 mg total) by mouth daily. 09/30/22   Angiulli, Lavon Paganini, PA-C  letrozole [facility]) 2.5 MG tablet Take 1 tablet (2.5 mg total) by mouth daily. 02/14/22   Owens Shark, NP  levETIRAcetam (KEPPRA) 500 MG tablet TAKE 1 TABLET(500 MG) BY MOUTH TWICE DAILY 12/02/22   Raulkar, Clide Deutscher, MD  melatonin 3 MG TABS tablet Take 1 tablet (3 mg total) by mouth at bedtime. Patient taking differently: Take 3 mg by mouth at bedtime. As needed 09/30/22   Angiulli, Lavon Paganini, PA-C  methocarbamol (ROBAXIN) 500 MG tablet Take 1 tablet (  500 mg total) by mouth every 4 (four) hours as needed for muscle spasms. 09/30/22   Angiulli, Lavon Paganini, PA-C  metoprolol succinate (TOPROL-XL) 25 MG 24 hr tablet Take 0.5 tablets (12.5 mg total) by mouth daily. Take with or immediately following a meal. 09/30/22   Angiulli, Lavon Paganini, PA-C  Multiple Vitamin (MULTIVITAMIN WITH MINERALS) TABS tablet Take 1 tablet by mouth daily. 09/30/22   Angiulli, Lavon Paganini, PA-C  oxyCODONE (OXY IR/ROXICODONE) 5 MG immediate release tablet Take 1 tablet (5 mg total) by mouth every 4 (four) hours as needed for moderate pain. 09/30/22   Angiulli, Lavon Paganini, PA-C  polyethylene glycol (MIRALAX / GLYCOLAX) 17 g packet Take 17 g by mouth daily. 09/30/22   Angiulli, Lavon Paganini, PA-C  potassium chloride (KLOR-CON) 10 MEQ tablet Take 10 mEq by mouth daily. 11/21/22   [provider]  psyllium (HYDROCIL/METAMUCIL) 95 % PACK Take 1 packet by  mouth daily. 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  Tobacco Use   Smoking status: Former    Packs/day: 1.00    Years: 20.00    Total pack years: 20.00    Types: Cigarettes    Quit date: 12/08/2000    Years since quitting: 22.0   Smokeless tobacco: Never  Vaping Use   Vaping Use: Never used  Substance and Sexual Activity   Alcohol use: No    Alcohol/week: 0.0 standard drinks of alcohol   Drug use: No   Sexual activity: Never  Other Topics Concern   Not on file  Social History Narrative   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'$    Social Determinants of Health   Financial Resource Strain: Low Risk  (10/22/2022)   Overall Financial Resource Strain (CARDIA)    Difficulty of Paying Living Expenses: Not very hard  Food Insecurity: No Food Insecurity (12/08/2022)   Hunger Vital Sign    Worried About Running Out of Food in the Last Year: Never true    [place] in the Last Year: Never true  Transportation Needs: No Transportation Needs (12/08/2022)   PRAPARE - Hydrologist (Medical): No    Lack of Transportation (Non-Medical): No  Physical Activity: Not on file  Stress: No Stress Concern Present (05/12/2022)   [place]    Feeling of Stress : Not at all  Social Connections: Not on file  Intimate Partner Violence: Not At Risk (12/08/2022)   Humiliation, Afraid, Rape, and Kick questionnaire    Fear of Current or Ex-Partner: No    Emotionally Abused: No    Physically Abused: No    Sexually Abused: No     Family History  Problem Relation Age of Onset   COPD Father    Esophageal cancer Father     Breast cancer Sister 59   Lung cancer Sister    Esophageal cancer Brother    Suicidality Brother    Lung cancer Sister    Lung cancer Sister    Cervical cancer Sister    Congestive Heart Failure Mother    Heart attack Maternal Grandmother  Skin cancer Maternal Grandfather    Heart attack Maternal Grandfather    Stroke Paternal Grandmother 34   Heart attack Paternal Grandfather    Cancer Maternal Uncle        unknown type, dx. >65   Cancer Paternal Aunt        unknown type, dx. >50   Cancer Paternal Uncle        unknown type, dx. >50   Cirrhosis Maternal Uncle    Skin cancer Daughter    Leukemia Cousin        maternal first cousin; dx. >50, in remission   Cancer Niece        unknown type behind her ear, dx. 30s/40s   Colon cancer Neg Hx     ROS: '[x]'$  Positive   '[ ]'$  Negative   '[ ]'$  All sytems reviewed and are negative  Cardiovascular: '[]'$  chest pain/pressure '[]'$  palpitations '[]'$  SOB lying flat '[]'$  DOE '[]'$  pain in legs while walking '[]'$  pain in legs at rest '[]'$  pain in legs at night '[]'$  non-healing ulcers '[]'$  hx of DVT '[]'$  swelling in legs  Pulmonary: '[]'$  productive cough '[]'$  asthma/wheezing '[]'$  home O2  Neurologic: '[]'$  weakness in '[]'$  arms '[]'$  legs '[]'$  numbness in '[]'$  arms '[]'$  legs '[]'$  hx of CVA '[]'$  mini stroke '[]'$ difficulty speaking or slurred speech '[]'$  temporary loss of vision in one eye '[]'$  dizziness  Hematologic: '[]'$  hx of cancer '[]'$  bleeding problems '[]'$  problems with blood clotting easily  Endocrine:   '[]'$  diabetes '[]'$  thyroid disease  GI '[]'$  vomiting blood '[]'$  blood in stool  GU: '[]'$  CKD/renal failure '[]'$  HD--'[]'$  M/W/F or '[]'$  T/T/S '[]'$  burning with urination '[]'$  blood in urine  Psychiatric: '[]'$  anxiety '[]'$  depression  Musculoskeletal: '[]'$  arthritis '[]'$  joint pain  Integumentary: '[]'$  rashes '[]'$  ulcers  Constitutional: '[]'$  fever '[]'$  chills   Physical Examination  Vitals:   12/20/22 0732 12/20/22 1135  BP: 122/85 (!) 146/63  Pulse: 91 84  Resp: 20 16  Temp:  97.6 F (36.4 C) 97.6 F (36.4 C)  SpO2: 90% (!) 88%   Body mass index is 28.37 kg/m.  General:  NAD Gait: Not observed HENT: WNL, normocephalic Pulmonary: normal non-labored breathing Cardiac: regular, without  Murmurs, rubs or gallops Abdomen:  soft, NT/ND Vascular Exam/Pulses: Weak right femoral pulse Hard to appreciate left femoral pulse No palpable pedal pulses Left great toe ulcer as pictured with ischemic changes Musculoskeletal: no muscle wasting or atrophy  Neurologic: A&O X 3; Appropriate Affect ; SENSATION: normal; MOTOR FUNCTION:  moving all extremities equally. Speech is fluent/normal     CBC    Component Value Date/Time   WBC 9.6 12/20/2022 0639   RBC 2.69 (L) 12/20/2022 0639   HGB 8.3 (L) 12/20/2022 0639   HGB 9.1 (L) 11/10/2022 1412   HGB 11.2 10/06/2017 1632   HGB 9.6 (L) 09/04/2017 1344   HCT 25.9 (L) 12/20/2022 0639   HCT 35.6 10/06/2017 1632   HCT 29.3 (L) 09/04/2017 1344   PLT 149 (L) 12/20/2022 0639   PLT 181 11/10/2022 1412   PLT 213 10/06/2017 1632   MCV 96.3 12/20/2022 0639   MCV 93 10/06/2017 1632   MCV 95.6 09/04/2017 1344   MCH 30.9 12/20/2022 0639   MCHC 32.0 12/20/2022 0639   RDW 14.6 12/20/2022 0639   RDW 15.1 10/06/2017 1632   RDW 14.7 (H) 09/04/2017 1344   LYMPHSABS 1.0 12/20/2022 0639   LYMPHSABS 1.4 09/04/2017 1344   MONOABS 0.5 12/20/2022 1448  MONOABS 0.6 09/04/2017 1344   EOSABS 0.1 12/20/2022 0639   EOSABS 0.1 09/04/2017 1344   BASOSABS 0.0 12/20/2022 0639   BASOSABS 0.0 09/04/2017 1344    BMET    Component Value Date/Time   NA 140 12/20/2022 0639   NA 147 (H) 05/19/2022 1605   NA 144 09/04/2017 1436   K 3.4 (L) 12/20/2022 0639   K 4.1 09/04/2017 1436   CL 95 (L) 12/20/2022 0639   CO2 32 12/20/2022 0639   CO2 26 09/04/2017 1436   GLUCOSE 197 (H) 12/20/2022 0639   GLUCOSE 152 (H) 09/04/2017 1436   BUN 72 (H) 12/20/2022 0639   BUN 26 05/19/2022 1605   BUN 20.6 09/04/2017 1436   CREATININE 2.39 (H)  12/20/2022 0639   CREATININE 1.53 (H) 01/22/2022 1457   CREATININE 1.4 (H) 09/04/2017 1436   CALCIUM 7.1 (L) 12/20/2022 0639   CALCIUM 9.4 09/04/2017 1436   GFRNONAA 20 (L) 12/20/2022 0639   GFRNONAA 34 (L) 01/22/2022 1457   GFRAA 39 (L) 10/04/2020 1404   GFRAA 39 (L) 03/29/2019 1459    COAGS: Lab Results  Component Value Date   INR 1.1 12/08/2021   INR 1.1 12/05/2021   INR 1.02 11/21/2017   PROTIME 13.2 02/29/2016     Non-Invasive Vascular Imaging:    ABI's 12/11/22: Non-compressible right monophasic waveform and 0.95 left multiphasic   ASSESSMENT/PLAN: This is a 82 y.o. female with history of atrial fibrillation on Eliquis, chronic diastolic heart failure, diabetes type 2, chronic kidney disease stage IV that vascular surgery has been asked to evaluate a left great toe wound.  Discussed that her toe pressure is inadequate for wound healing.  I cannot appreciate any pedal pulses on the left even though her ABIs were 0.95 multiphasic.  In addition I have a hard time appreciating a left femoral pulse and she has some iliac disease on a CT scan with contrast earlier this year.  I have recommended aortogram with lower extremity arteriogram with a focus on the left leg.  This will have to be done with CO2 given her AKI on CKD stage IV.  This will is a limb threatening situation for her.  Will need to hold her Eliquis.  Next available spot in the Cath Lab would be Wednesday.  Risks benefits discussed.  Vascular will follow.  Medicine has called podiatry as well.   Marty Heck, MD [facility] Office: [place]

## 2022-12-21 DIAGNOSIS — L97522 Non-pressure chronic ulcer of other part of left foot with fat layer exposed: Secondary | ICD-10-CM

## 2022-12-21 DIAGNOSIS — I96 Gangrene, not elsewhere classified: Secondary | ICD-10-CM

## 2022-12-21 DIAGNOSIS — N3000 Acute cystitis without hematuria: Secondary | ICD-10-CM | POA: Diagnosis not present

## 2022-12-21 LAB — CBC WITH DIFFERENTIAL/PLATELET
Abs Immature Granulocytes: 0.03 10*3/uL (ref 0.00–0.07)
Basophils Absolute: 0 10*3/uL (ref 0.0–0.1)
Basophils Relative: 0 %
Eosinophils Absolute: 0.2 10*3/uL (ref 0.0–0.5)
Eosinophils Relative: 2 %
HCT: 25.4 % — ABNORMAL LOW (ref 36.0–46.0)
Hemoglobin: 8 g/dL — ABNORMAL LOW (ref 12.0–15.0)
Immature Granulocytes: 0 %
Lymphocytes Relative: 12 %
Lymphs Abs: 1 10*3/uL (ref 0.7–4.0)
MCH: 30.5 pg (ref 26.0–34.0)
MCHC: 31.5 g/dL (ref 30.0–36.0)
MCV: 96.9 fL (ref 80.0–100.0)
Monocytes Absolute: 0.6 10*3/uL (ref 0.1–1.0)
Monocytes Relative: 7 %
Neutro Abs: 6.6 10*3/uL (ref 1.7–7.7)
Neutrophils Relative %: 79 %
Platelets: 157 10*3/uL (ref 150–400)
RBC: 2.62 MIL/uL — ABNORMAL LOW (ref 3.87–5.11)
RDW: 14.9 % (ref 11.5–15.5)
WBC: 8.3 10*3/uL (ref 4.0–10.5)
nRBC: 0 % (ref 0.0–0.2)

## 2022-12-21 LAB — COMPREHENSIVE METABOLIC PANEL
ALT: 10 U/L (ref 0–44)
AST: 18 U/L (ref 15–41)
Albumin: 2.4 g/dL — ABNORMAL LOW (ref 3.5–5.0)
Alkaline Phosphatase: 99 U/L (ref 38–126)
Anion gap: 12 (ref 5–15)
BUN: 72 mg/dL — ABNORMAL HIGH (ref 8–23)
CO2: 33 mmol/L — ABNORMAL HIGH (ref 22–32)
Calcium: 7.2 mg/dL — ABNORMAL LOW (ref 8.9–10.3)
Chloride: 97 mmol/L — ABNORMAL LOW (ref 98–111)
Creatinine, Ser: 2.24 mg/dL — ABNORMAL HIGH (ref 0.44–1.00)
GFR, Estimated: 22 mL/min — ABNORMAL LOW (ref >60)
Glucose, Bld: 256 mg/dL — ABNORMAL HIGH (ref 70–99)
Potassium: 3.8 mmol/L (ref 3.5–5.1)
Sodium: 142 mmol/L (ref 135–145)
Total Bilirubin: 0.5 mg/dL (ref 0.3–1.2)
Total Protein: 5.1 g/dL — ABNORMAL LOW (ref 6.5–8.1)

## 2022-12-21 LAB — MAGNESIUM: Magnesium: 1.9 mg/dL (ref 1.7–2.4)

## 2022-12-21 LAB — GLUCOSE, CAPILLARY
Glucose-Capillary: 257 mg/dL — ABNORMAL HIGH (ref 70–99)
Glucose-Capillary: 69 mg/dL — ABNORMAL LOW (ref 70–99)
Glucose-Capillary: 116 mg/dL — ABNORMAL HIGH (ref 70–99)
Glucose-Capillary: 198 mg/dL — ABNORMAL HIGH (ref 70–99)
Glucose-Capillary: 142 mg/dL — ABNORMAL HIGH (ref 70–99)

## 2022-12-21 LAB — PHOSPHORUS: Phosphorus: 3.2 mg/dL (ref 2.5–4.6)

## 2022-12-21 MED ORDER — APIXABAN 2.5 MG PO TABS
2.5000 mg | ORAL_TABLET | Freq: Two times a day (BID) | ORAL | Status: DC
  Administered 2022-12-21: 2.5 mg via ORAL
  Filled 2022-12-21: qty 1

## 2022-12-21 MED ORDER — HEPARIN SOD (PORK) LOCK FLUSH 100 UNIT/ML IV SOLN
500.0000 [IU] | INTRAVENOUS | Status: AC | PRN
  Administered 2022-12-21: 500 [IU]

## 2022-12-21 NOTE — Progress Notes (Signed)
[facility]  Subjective  - no complaints   Objective (!) 107/48 78 98.2 F (36.8 C) (Oral) 16 97%  Intake/Output Summary (Last 24 hours) at 12/21/2022 1157 Last data filed at 12/21/2022 0600 Gross per 24 hour  Intake 350 ml  Output 500 ml  Net -150 ml      Laboratory Lab Results: Recent Labs    12/20/22 0639 12/21/22 0242  WBC 9.6 8.3  HGB 8.3* 8.0*  HCT 25.9* 25.4*  PLT 149* 157   BMET Recent Labs    12/20/22 0639 12/21/22 0242  NA 140 142  K 3.4* 3.8  CL 95* 97*  CO2 32 33*  GLUCOSE 197* 256*  BUN 72* 72*  CREATININE 2.39* 2.24*  CALCIUM 7.1* 7.2*    COAG Lab Results  Component Value Date   INR 1.1 12/08/2021   INR 1.1 12/05/2021   INR 1.02 11/21/2017   PROTIME 13.2 02/29/2016   No results found for: "PTT"  Assessment/Planning:  82 y.o. female with history of atrial fibrillation on Eliquis, chronic diastolic heart failure, diabetes type 2, chronic kidney disease stage IV that vascular surgery has been asked to evaluate a left great toe wound.  Discussed that her toe pressure is inadequate for wound healing.  I cannot appreciate any pedal pulses on the left even though her ABIs were 0.95 multiphasic.  In addition I have a hard time appreciating a left femoral pulse and she has some iliac disease on a CT scan with contrast earlier this year.  I have recommended aortogram with lower extremity arteriogram with a focus on the left leg.  This will have to be done with CO2 given her AKI on CKD stage IV.  This will is a limb threatening situation for her.  Will need to hold her Eliquis for at least 48 hours.  Next available spot in the Cath Lab would be Wednesday.    Marty Heck 12/21/2022 11:57 AM --

## 2022-12-21 NOTE — Progress Notes (Signed)
PROGRESS NOTE    [name]  YNW:[id number] DOB: 10/11/1941 DOA: 12/06/2022 PCP: Isaac Bliss, Rayford Halsted, MD  Chief Complaint  Patient presents with   Weakness    Brief Narrative:  [name] is [name] 82 y.o. female with [name] history of AML, breast cancer, paroxysmal atrial fibrillation on Eliquis, chronic diastolic heart failure, diabetes mellitus type 2, CKD stage IV. Patient presented secondary to generalized weakness and found to have evidence of UTI, hypocalcemia and acute heart failure. Patient started on calcium replacement, empiric Ceftriaxone and given Lasix. Associated hyperphosphatemia.    With supplementation of calcium and vitamin D, levels have improved. UTI treated. Renal function remains poor and patient developing increasing volume overload, so IV lasix initiated. Now on PO diuretics and renal signed off.   Patient with L great toe wound with dusky appearance.  Vascular following, plan for arteriogram Wednesday.  Assessment & Plan:   Principal Problem:   Acute cystitis Active Problems:   Paroxysmal atrial fibrillation (HCC)   Hyperlipidemia   Anemia of chronic disease   CKD (chronic kidney disease) stage 4, GFR 15-29 ml/min (HCC)   DM2 (diabetes mellitus, type 2) (HCC)   Acute on chronic diastolic CHF (congestive heart failure) (HCC)   Generalized weakness   Hypocalcemia   History of seizures  Left foot pain  Left Great Toe Wound: Has wound on great toe after dropping [name] jar of pennies on it. No cellulitis is evident at this time. No fracture on radiographs.  - Local wound care per WOC - ABI normal on left, unable to obtain TBI due to great toe wound/absent wave forms - Vascular c/s, planning for arteriogram with focus on left leg with CO2 given AKI on CKD (Wednesday) - hold eliquis starting 1/15 (48 hrs prior to procedure Wednesday) - appreciate podiatry, daily betadine and xeroform - WBAT L foot, can use postop shoe if desired.    Acute cystitis: Urine  culture significant for Klebsiella pneumoniae, resistant to ampicillin and nitrofurantoin.   - Completed ceftriaxone, symptoms resolved.   Generalized weakness: Noted on admission. Possibly related to acute illness however, possibly related to hypocalcemia.  - PT/OT recommend home health on discharge.   Acute on chronic diastolic heart failure Present on admission per history. BNP of 849.4. Patient given Lasix 20 mg IV on admission. Lasix held subsequently and received IV bicarbonate for Catherine Bartnik while. Overall is increasingly volume up. Weight currently 75.5kg, up from admission when it was 155 lbs (70.7kg). EDW thought to be near 63.3kg at last CHF discharge.  - Appreciate nephrology assistance with diuresis.Transitioned to PO.  Follow volume status. - Continue strict I/O, daily weights.  - High suspicion for third spacing related to hypoalbuminemia.  - DNR status is confirmed but ongoing goals of care discussions would be helpful. Palliative care consulted, whose assistance is appreciated -> DNR, open to all medical interventions offered.   Hypocalcemia: Patient with associated hyperphosphatemia. Likely related to renal impairment.  - Remains low, though overall improved and not severe nor symptomatic. - Continue calcium acetate w/hyperphosphatemia.  - Vitamin D deficiency, level is < 20, supplement 50k units weekly started - PTH 307 consistent with hyperparathyroidism/MBD secondary to vitamin D deficiency and renal failure.   AKI on CKD stage IV: Baseline creatinine of about 2 with more recent creatinine of 4.33. Creatinine of 3.03 on admission and stable-to-slightly-improved since that time. Patient appears overloaded, although she was hypotensive on admission. Renal ultrasound unremarkable.  - Appreciate nephrology assistance.  Follow on  oral diuresis per renal. - renal now signed off 1/14   Anemia of CKD: s/p 1u PRBC 1/6.  - Monitor hgb, up as anticipated.   Hyperphosphatemia: Likely  secondary to kidney disease.  - Calcium acetate - Renal diet   Hypomagnesemia - Improved. Will continue monitoring.   Metabolic acidosis: Due to renal impairment - improved, bicarb d/c'd   Constipation:  - Senokot daily, sorbitol prn. Continue this to limit further impaction - enema reordered 1/11   Hyperlipidemia - Continue atorvastatin   T2DM: Chronically uncontrolled with hyperglycemia (HbA1c 9.9%) acutely hypoglycemic on 1/1.  - Continue SSI (stopped basal) - HHRN will be ordered at DC (as well as PT/OT/aide)   Paroxysmal atrial fibrillation -Continue Eliquis   History of seizures - Continue Keppra    Abnormal right ABI: Noncompressible arteries, abnormal TBI of 0.26.  - appreciate vascular assistance    AML: In remission. Patient follows with Dr. [name] as an outpatient.   CAD: Patient with [name] history of NSTEMI, treated medically.  - Continue plavix, atorvastatin.  Also on eliquis. - restarting BB, imdur    DVT prophylaxis: eliquis Code Status: dnr Family Communication: none Disposition:   Status is: Inpatient Remains inpatient appropriate because: ongoing diuresis   Consultants:  Renal  palliative  Procedures:  Summary:  Right: Resting right ankle-brachial index indicates noncompressible right  lower extremity arteries. The right toe-brachial index is abnormal.   Left: Resting left ankle-brachial index is within normal range.   Unable to obtain TBI due to great toe wound, and absent waveforms.   Antimicrobials:  Anti-infectives (From admission, onward)    Start     Dose/Rate Route Frequency Ordered Stop   12/07/22 2200  cefTRIAXone (ROCEPHIN) 1 g in sodium chloride 0.9 % 100 mL IVPB  Status:  Discontinued        1 g 200 mL/hr over 30 Minutes Intravenous Every 24 hours 12/07/22 0301 12/09/22 1220   12/07/22 0145  cefTRIAXone (ROCEPHIN) 2 g in sodium chloride 0.9 % 100 mL IVPB        2 g 200 mL/hr over 30 Minutes Intravenous  Once 12/07/22 0136  12/07/22 0540       Subjective: C/o toe pain  Objective: Vitals:   12/21/22 0448 12/21/22 0754 12/21/22 1228 12/21/22 1659  BP: (!) 150/78 (!) 107/48 (!) 125/54 (!) 141/78  Pulse: 83 78 84 95  Resp: '16 16 16 18  '$ Temp: 97.6 F (36.4 C) 98.2 F (36.8 C) 97.6 F (36.4 C) 97.6 F (36.4 C)  TempSrc: Oral Oral Oral Axillary  SpO2: 93% 97% 94%   Weight: 72.8 kg     Height:        Intake/Output Summary (Last 24 hours) at 12/21/2022 1934 Last data filed at 12/21/2022 0600 Gross per 24 hour  Intake 350 ml  Output 500 ml  Net -150 ml   Filed Weights   12/19/22 0532 12/20/22 0618 12/21/22 0448  Weight: 71.7 kg 70.4 kg 72.8 kg    Examination:  General: No acute distress. Cardiovascular: RRR Lungs: unlabored Abdomen: Soft, nontender, nondistended  Neurological: Alert and oriented 3. Moves all extremities 4. Cranial nerves II through XII grossly intact. Skin: dusky L first toe partially visible under dressing Extremities: dressings to bilateral LE's   Data Reviewed: I have personally reviewed following labs and imaging studies  CBC: Recent Labs  Lab 12/16/22 0535 12/18/22 0622 12/19/22 0303 12/20/22 0639 12/21/22 0242  WBC 6.8 7.4 8.9 9.6 8.3  NEUTROABS  --   --  6.9 8.0* 6.6  HGB 8.3* 8.4* 8.8* 8.3* 8.0*  HCT 25.5* 26.6* 27.4* 25.9* 25.4*  MCV 95.5 97.4 95.8 96.3 96.9  PLT 153 160 157 149* 010    Basic Metabolic Panel: Recent Labs  Lab 12/17/22 0503 12/18/22 0622 12/19/22 0303 12/20/22 0639 12/21/22 0242  NA 140 140 139 140 142  K 3.8 3.8 3.6 3.4* 3.8  CL 101 99 96* 95* 97*  CO2 27 32 33* 32 33*  GLUCOSE 121* 269* 172* 197* 256*  BUN 67* 69* 67* 72* 72*  CREATININE 2.36* 2.55* 2.53* 2.39* 2.24*  CALCIUM 7.0* 7.2* 7.2* 7.1* 7.2*  MG 1.9 2.0 1.8 1.9 1.9  PHOS 3.7 3.7 3.3 3.4 3.2    GFR: Estimated Creatinine Clearance: 18.4 mL/min (Tremon Sainvil) (by C-G formula based on SCr of 2.24 mg/dL (H)).  Liver Function Tests: Recent Labs  Lab 12/17/22 0503  12/18/22 0622 12/19/22 0303 12/20/22 0639 12/21/22 0242  AST  --   --  17  --  18  ALT  --   --  11  --  10  ALKPHOS  --   --  108  --  99  BILITOT  --   --  0.6  --  0.5  PROT  --   --  5.4*  --  5.1*  ALBUMIN 2.4* 2.4* 2.5* 2.4* 2.4*    CBG: Recent Labs  Lab 12/20/22 2113 12/21/22 0752 12/21/22 1224 12/21/22 1400 12/21/22 1658  GLUCAP 154* 257* 69* 116* 198*     No results found for this or any previous visit (from the past 240 hour(s)).       Radiology Studies: No results found.      Scheduled Meds:  apixaban  2.5 mg Oral BID   atorvastatin  40 mg Oral Daily   calcium acetate  1,334 mg Oral TID WC   clopidogrel  75 mg Oral QPM   furosemide  40 mg Oral Daily   insulin aspart  0-9 Units Subcutaneous TID WC   isosorbide mononitrate  30 mg Oral Daily   leptospermum manuka honey  1 Application Topical Daily   letrozole  2.5 mg Oral Daily   levETIRAcetam  500 mg Oral BID   metoprolol succinate  12.5 mg Oral Daily   mupirocin ointment   Topical Daily   polyethylene glycol  17 g Oral BID   senna-docusate  1 tablet Oral BID   Vitamin D (Ergocalciferol)  50,000 Units Oral Q7 days   Continuous Infusions:   LOS: 13 days    Time spent: over 30 min    Fayrene Helper, MD [facility]   To contact the attending provider between 7A-7P or the covering provider during after hours 7P-7A, please log into the web site [email or web address] and access using universal [place] password for that web site. If you do not have the password, please call the hospital operator.  12/21/2022, 7:34 PM

## 2022-12-21 NOTE — Progress Notes (Signed)
Mobility Specialist - Progress Note   12/21/22 1000  Mobility  Activity Ambulated with assistance in room  Level of Assistance Minimal assist, patient does 75% or more  Assistive Device Front wheel walker  Distance Ambulated (ft) 40 ft  LLE Weight Bearing WBAT  Activity Response Tolerated well  Mobility Referral Yes  $Mobility charge 1 Mobility   Pt was received at EOB and agreeable to mobility. Pt c/o LLE pain throughout ambulation. Pt was returned to bed with all needs met.  Franki Monte  Mobility Specialist Please contact via S[facility] or Rehab office at [phone]

## 2022-12-21 NOTE — Progress Notes (Signed)
Patient ID: [name], female   DOB: [birth year], 82 y.o.   MRN: [id number] [facility] Progress Note   Assessment/ Plan:   1. Acute kidney Injury on chronic kidney disease stage IV: Suspect to be hemodynamically mediated in the setting of CHF exacerbation and urinary tract infection.  Renal function remained stable following transition to oral diuretics and she does not have any notable changes in her respiratory status/volume status.  I will arrange for outpatient follow-up with nephrology. 2.  Ischemic left hallux: Evaluated yesterday by vascular surgery and plans noted for CO2 arteriogram likely on Wednesday 1/17 after Eliquis held until then.  Seen earlier by podiatry who have recommended wound care instructions.  3.  Klebsiella urinary tract infection: Status post completion of antimicrobial therapy. 4.  Acute exacerbation of diastolic heart failure: Appears now to be compensated, transitioned to oral diuretics starting today. 5.  Hypocalcemia: Continue current calcium supplementation, phosphorus level at goal.  Nephrology will sign off and remain available questions or concerns.  I will arrange for outpatient nephrology follow-up.  Subjective:   Seen yesterday by vascular surgery with regards to left foot hallux ischemic ulcer.  82st birthday today.   Objective:   BP (!) 107/48 (BP Location: Left Arm)   Pulse 78   Temp 98.2 F (36.8 C) (Oral)   Resp 16   Ht '5\' 2"'$  (1.575 m)   Wt 72.8 kg   SpO2 97%   BMI 29.35 kg/m   Intake/Output Summary (Last 24 hours) at 12/21/2022 1002 Last data filed at 12/21/2022 0600 Gross per 24 hour  Intake 350 ml  Output 500 ml  Net -150 ml   Weight change: 2.447 kg  Physical Exam: Gen: Resting comfortably in bed. CVS: Pulse regular rhythm, normal rate, S1 and S2 normal Resp: Fine rales right lung base with no rales on left, no wheeze/rhonchi Abd: Soft, flat, nontender, bowel sounds normal Ext: Trace pitting edema bilaterally.   Left foot in Ace wrap dressing.  Imaging: No results found.  Labs: BMET Recent Labs  Lab 12/15/22 0534 12/16/22 0535 12/17/22 0503 12/18/22 0622 12/19/22 0303 12/20/22 0639 12/21/22 0242  NA 139 140 140 140 139 140 142  K 3.4* 3.7 3.8 3.8 3.6 3.4* 3.8  CL 102 103 101 99 96* 95* 97*  CO2 '27 28 27 '$ 32 33* 32 33*  GLUCOSE 176* 140* 121* 269* 172* 197* 256*  BUN 69* 67* 67* 69* 67* 72* 72*  CREATININE 2.80* 2.77* 2.36* 2.55* 2.53* 2.39* 2.24*  CALCIUM 6.5* 6.9* 7.0* 7.2* 7.2* 7.1* 7.2*  PHOS 4.2 3.7 3.7 3.7 3.3 3.4 3.2   CBC Recent Labs  Lab 12/18/22 0622 12/19/22 0303 12/20/22 0639 12/21/22 0242  WBC 7.4 8.9 9.6 8.3  NEUTROABS  --  6.9 8.0* 6.6  HGB 8.4* 8.8* 8.3* 8.0*  HCT 26.6* 27.4* 25.9* 25.4*  MCV 97.4 95.8 96.3 96.9  PLT 160 157 149* 157    Medications:     apixaban  2.5 mg Oral BID   atorvastatin  40 mg Oral Daily   calcium acetate  1,334 mg Oral TID WC   clopidogrel  75 mg Oral QPM   furosemide  40 mg Oral Daily   insulin aspart  0-9 Units Subcutaneous TID WC   isosorbide mononitrate  30 mg Oral Daily   leptospermum manuka honey  1 Application Topical Daily   letrozole  2.5 mg Oral Daily   levETIRAcetam  500 mg Oral BID   metoprolol succinate  12.5  mg Oral Daily   mupirocin ointment   Topical Daily   polyethylene glycol  17 g Oral BID   senna-docusate  1 tablet Oral BID   Vitamin D (Ergocalciferol)  50,000 Units Oral Q7 days   Elmarie Shiley, MD 12/21/2022, 10:02 AM

## 2022-12-21 NOTE — Consult Note (Signed)
PODIATRY CONSULTATION  NAME [name] MRN [id number] DOB 10-May-1941 DOA 12/06/2022   Reason for consult:  Chief Complaint  Patient presents with   Weakness    Attending/Consulting physician: Florene Glen MD  History of present illness: 82 y.o. female with PMHx significant for  afib on eliquis, CHF, DM2, CKD IV  admitted for weakness found to have UTI and AKI. Also has wound on left toe that started when she dropped a jar of pennies on it approx 2 weeks ago. Has been getting progressively darker and not healing appropriately per pt. Has not seen anyone prior to admission for the wound. ABI obtained 1/4 that showed Craig RLE and 0.95 abi L with absent waveform Left hallux. Pt also had XR on admission showing no acute osteolysis/erosions or ephysema and no fracture. She does report some pain in the toe but denies N/V/F/C.   Past Medical History:  Diagnosis Date   Acute myelogenous leukemia ([place]) 02/2014   Acute pancreatitis    Arthritis    "back" (09/17/2017)   Atrial flutter (HCC)    Cardiomyopathy (HCC)    CHF (congestive heart failure) (HCC)    Chronic kidney disease, stage II (mild)    Chronic lower back pain    Colon polyps 04/29/2010   TUBULAR ADENOMA AND A SERRATED ADENOMA   COPD (chronic obstructive pulmonary disease) ([place])    CORONARY ARTERY DISEASE 12/24/2007   DIABETES MELLITUS, TYPE II 07/13/2007   History of blood transfusion 2015   "related to leukemia"   History of kidney stones 12/24/2007   History of uterine cancer    HYPERLIPIDEMIA 12/24/2007   HYPERTENSION 07/13/2007   NSTEMI (non-ST elevated myocardial infarction) ([place]) 09/17/2017   Uterine cancer (HCC)        Latest Ref Rng & Units 12/21/2022    2:42 AM 12/20/2022    6:39 AM 12/19/2022    3:03 AM  CBC  WBC 4.0 - 10.5 K/uL 8.3  9.6  8.9   Hemoglobin 12.0 - 15.0 g/dL 8.0  8.3  8.8   Hematocrit 36.0 - 46.0 % 25.4  25.9  27.4   Platelets 150 - 400 K/uL 157  149  157        Latest Ref Rng & Units  12/21/2022    2:42 AM 12/20/2022    6:39 AM 12/19/2022    3:03 AM  BMP  Glucose 70 - 99 mg/dL 256  197  172   BUN 8 - 23 mg/dL 72  72  67   Creatinine 0.44 - 1.00 mg/dL 2.24  2.39  2.53   Sodium 135 - 145 mmol/L 142  140  139   Potassium 3.5 - 5.1 mmol/L 3.8  3.4  3.6   Chloride 98 - 111 mmol/L 97  95  96   CO2 22 - 32 mmol/L 33  32  33   Calcium 8.9 - 10.3 mg/dL 7.2  7.1  7.2       Physical Exam: Lower Extremity Exam Vasc: R - PT non palpable, DP non palpable. Cap refill < 3 sec to digits  L - PT nonpalpable, DP nonpalpable. Cap refill absent to left hallux  Derm: R - Normal temp/texture/turgor with no open lesion or clinical signs of infection  L - Wound dorsal aspect L hallux with fibrotic tissue base. No erythema or edema of the toe. No deep probing aspect of the wound Black discoloration at the distal toe consistent with early gangrenous changes.     MSK:  R -  No gross deformities. Compartments soft, non-tender, compressible  L -  No gross deformities. Compartments soft, non-tender, compressible  Neuro: R - Gross sensation diminished. Gross motor function intact   L - Gross sensation diminished. Gross motor function intact    ASSESSMENT/PLAN OF CARE 82 y.o. female with PMHx significant for  afib on eliquis, CHF, DM2, CKD IV with dorsal superficial non healing wound to the left hallux with evidence of early gangrene in the toe related to PAD.  - No surgical plans. Recommend continued local wound care. Recommend continued daily betadine and xerform applied to the wound on the dorsal left hallux. - Appreciate vascular plan, agree with need for attempt at revasc to save the left hallux. - as wounds superficial and dry and xr negative for OM no indication for amputation at this time, though discussed with pt that if the toe fails to heal or has worsening gangrenous changes may ultimately need amputation.  - She can follow up with our office and wound care center post discharge   - Anticoagulation: eliquis  - Wound care: daily betadine swab applied to toe and cover with non adherent gauze or xeroform ang 4x4 gauze.  - WB status: WBAT to LLE in post op shoe, ordered. - Podiatry to sign off, please re consult if there is an acute change in the toe concerning for infection   Thank you for the consult.  Please contact me directly with any questions or concerns.           Everitt Amber, DPM [facility]    [address], [place] [address]                Office [phone]  Fax [phone]

## 2022-12-22 DIAGNOSIS — N3000 Acute cystitis without hematuria: Secondary | ICD-10-CM | POA: Diagnosis not present

## 2022-12-22 LAB — COMPREHENSIVE METABOLIC PANEL
ALT: 11 U/L (ref 0–44)
AST: 18 U/L (ref 15–41)
Albumin: 2.4 g/dL — ABNORMAL LOW (ref 3.5–5.0)
Alkaline Phosphatase: 103 U/L (ref 38–126)
Anion gap: 11 (ref 5–15)
BUN: 71 mg/dL — ABNORMAL HIGH (ref 8–23)
CO2: 33 mmol/L — ABNORMAL HIGH (ref 22–32)
Calcium: 7.2 mg/dL — ABNORMAL LOW (ref 8.9–10.3)
Chloride: 97 mmol/L — ABNORMAL LOW (ref 98–111)
Creatinine, Ser: 2.25 mg/dL — ABNORMAL HIGH (ref 0.44–1.00)
GFR, Estimated: 21 mL/min — ABNORMAL LOW (ref >60)
Glucose, Bld: 175 mg/dL — ABNORMAL HIGH (ref 70–99)
Potassium: 4.3 mmol/L (ref 3.5–5.1)
Sodium: 141 mmol/L (ref 135–145)
Total Bilirubin: 0.6 mg/dL (ref 0.3–1.2)
Total Protein: 5.1 g/dL — ABNORMAL LOW (ref 6.5–8.1)

## 2022-12-22 LAB — CBC WITH DIFFERENTIAL/PLATELET
Abs Immature Granulocytes: 0.03 10*3/uL (ref 0.00–0.07)
Basophils Absolute: 0 10*3/uL (ref 0.0–0.1)
Basophils Relative: 0 %
Eosinophils Absolute: 0.2 10*3/uL (ref 0.0–0.5)
Eosinophils Relative: 2 %
HCT: 24.8 % — ABNORMAL LOW (ref 36.0–46.0)
Hemoglobin: 7.5 g/dL — ABNORMAL LOW (ref 12.0–15.0)
Immature Granulocytes: 0 %
Lymphocytes Relative: 14 %
Lymphs Abs: 1.2 10*3/uL (ref 0.7–4.0)
MCH: 30.4 pg (ref 26.0–34.0)
MCHC: 30.2 g/dL (ref 30.0–36.0)
MCV: 100.4 fL — ABNORMAL HIGH (ref 80.0–100.0)
Monocytes Absolute: 0.7 10*3/uL (ref 0.1–1.0)
Monocytes Relative: 8 %
Neutro Abs: 6.6 10*3/uL (ref 1.7–7.7)
Neutrophils Relative %: 76 %
Platelets: 154 10*3/uL (ref 150–400)
RBC: 2.47 MIL/uL — ABNORMAL LOW (ref 3.87–5.11)
RDW: 14.9 % (ref 11.5–15.5)
WBC: 8.7 10*3/uL (ref 4.0–10.5)
nRBC: 0 % (ref 0.0–0.2)

## 2022-12-22 LAB — PHOSPHORUS: Phosphorus: 3.5 mg/dL (ref 2.5–4.6)

## 2022-12-22 LAB — GLUCOSE, CAPILLARY
Glucose-Capillary: 205 mg/dL — ABNORMAL HIGH (ref 70–99)
Glucose-Capillary: 111 mg/dL — ABNORMAL HIGH (ref 70–99)
Glucose-Capillary: 165 mg/dL — ABNORMAL HIGH (ref 70–99)
Glucose-Capillary: 190 mg/dL — ABNORMAL HIGH (ref 70–99)

## 2022-12-22 LAB — MAGNESIUM: Magnesium: 1.9 mg/dL (ref 1.7–2.4)

## 2022-12-22 NOTE — Plan of Care (Signed)
  Problem: Clinical Measurements: ?Goal: Ability to maintain clinical measurements within normal limits will improve ?Outcome: Progressing ?Goal: Will remain free from infection ?Outcome: Progressing ?Goal: Diagnostic test results will improve ?Outcome: Progressing ?Goal: Respiratory complications will improve ?Outcome: Progressing ?Goal: Cardiovascular complication will be avoided ?Outcome: Progressing ?  ?

## 2022-12-22 NOTE — Progress Notes (Addendum)
Progress Note    12/22/2022 7:44 AM * No surgery found *  Subjective:  says that both legs hurt her    Vitals:   12/21/22 2044 12/22/22 0543  BP: (!) 130/51 130/61  Pulse: 99 88  Resp: 20 20  Temp: 97.6 F (36.4 C) 97.6 F (36.4 C)  SpO2: 99% 94%   Physical Exam: Cardiac:  regular Lungs:  non labored Extremities:  Palpable right femoral, no palpable left femoral. No palpable distal pulses bilaterally. Bilateral lower legs and left foot wrapped in kerlix and ACE Neurologic: alert and oriented   CBC    Component Value Date/Time   WBC 8.7 12/22/2022 0310   RBC 2.47 (L) 12/22/2022 0310   HGB 7.5 (L) 12/22/2022 0310   HGB 9.1 (L) 11/10/2022 1412   HGB 11.2 10/06/2017 1632   HGB 9.6 (L) 09/04/2017 1344   HCT 24.8 (L) 12/22/2022 0310   HCT 35.6 10/06/2017 1632   HCT 29.3 (L) 09/04/2017 1344   PLT 154 12/22/2022 0310   PLT 181 11/10/2022 1412   PLT 213 10/06/2017 1632   MCV 100.4 (H) 12/22/2022 0310   MCV 93 10/06/2017 1632   MCV 95.6 09/04/2017 1344   MCH 30.4 12/22/2022 0310   MCHC 30.2 12/22/2022 0310   RDW 14.9 12/22/2022 0310   RDW 15.1 10/06/2017 1632   RDW 14.7 (H) 09/04/2017 1344   LYMPHSABS 1.2 12/22/2022 0310   LYMPHSABS 1.4 09/04/2017 1344   MONOABS 0.7 12/22/2022 0310   MONOABS 0.6 09/04/2017 1344   EOSABS 0.2 12/22/2022 0310   EOSABS 0.1 09/04/2017 1344   BASOSABS 0.0 12/22/2022 0310   BASOSABS 0.0 09/04/2017 1344    BMET    Component Value Date/Time   NA 141 12/22/2022 0310   NA 147 (H) 05/19/2022 1605   NA 144 09/04/2017 1436   K 4.3 12/22/2022 0310   K 4.1 09/04/2017 1436   CL 97 (L) 12/22/2022 0310   CO2 33 (H) 12/22/2022 0310   CO2 26 09/04/2017 1436   GLUCOSE 175 (H) 12/22/2022 0310   GLUCOSE 152 (H) 09/04/2017 1436   BUN 71 (H) 12/22/2022 0310   BUN 26 05/19/2022 1605   BUN 20.6 09/04/2017 1436   CREATININE 2.25 (H) 12/22/2022 0310   CREATININE 1.53 (H) 01/22/2022 1457   CREATININE 1.4 (H) 09/04/2017 1436   CALCIUM 7.2 (L)  12/22/2022 0310   CALCIUM 9.4 09/04/2017 1436   GFRNONAA 21 (L) 12/22/2022 0310   GFRNONAA 34 (L) 01/22/2022 1457   GFRAA 39 (L) 10/04/2020 1404   GFRAA 39 (L) 03/29/2019 1459    INR    Component Value Date/Time   INR 1.1 12/08/2021 1251   INR 1.10 (L) 02/29/2016 1057     Intake/Output Summary (Last 24 hours) at 12/22/2022 0744 Last data filed at 12/21/2022 2242 Gross per 24 hour  Intake --  Output 551 ml  Net -551 ml     Assessment/Plan:  82 y.o. female with PAD with left great toe wound. She does not have palpable left femoral pulse or distal pulses bilaterally. Dr. Carlis Abbott discussed with her recommendation for Angiogram. This is scheduled with CO2 due to her AKI on CKD stage IV. Discussed procedure again with her this morning and answered her questions. Plan is for Aortogram, arteriogram BLE with possible LLE intervention on Wednesday 1/17 with Dr. Virl Cagey.   Karoline Caldwell, PA-C [facility] [phone] 12/22/2022 7:44 AM  I have seen and evaluated the patient. I agree with the PA note  as documented above. Angio Wednesday with Dr. Virl Cagey for left lower extremity tissue loss.  Marty Heck, MD [facility] [place] Office: [phone]

## 2022-12-22 NOTE — Progress Notes (Signed)
Physical Therapy Treatment Patient Details Name: [name] MRN: [id number] DOB: [birth year] Today's Date: 12/22/2022   History of Present Illness 82 y.o. female admitted to [facility] on 12/06/2022 with suspected acute cystitis after presenting from home to [facility] ED complaining of generalized weakness and fall. PMH: AML, breast cancer, paroxysmal atrial fibrillation chronically anticoagulated on Eliquis, chronic diastolic heart failure, type 2 diabetes mellitus, CKD 4 associated baseline creatinine 1.8-3.0, anemia of chronic disease associated baseline hemoglobin 8-10,    PT Comments    Pt supine in bed agreeable to walking with PT. Pt is supervision for bed mobility and min guard to come to standing in RW. Once standing pt reports need to use BSC. Able to pivot to [facility]. Pt with large BM and returned to bed for NT to help clean up. PT will work to progress mobility in next session.     Recommendations for follow up therapy are one component of a multi-disciplinary discharge planning process, led by the attending physician.  Recommendations may be updated based on patient status, additional functional criteria and insurance authorization.  Follow Up Recommendations  Home health PT     Assistance Recommended at Discharge Intermittent Supervision/Assistance  Patient can return home with the following A little help with walking and/or transfers;A little help with bathing/dressing/bathroom;Assistance with cooking/housework;Direct supervision/assist for medications management;Direct supervision/assist for financial management;Assist for transportation;Help with stairs or ramp for entrance   Equipment Recommendations  None recommended by PT    Recommendations for Other Services       Precautions / Restrictions Precautions Precautions: Fall Restrictions Weight Bearing Restrictions: No LLE Weight Bearing: Weight bearing as tolerated     Mobility  Bed Mobility Overal bed mobility:  Needs Assistance Bed Mobility: Sidelying to Sit, Sit to Sidelying   Sidelying to sit: Supervision       General bed mobility comments: +rail from flattened bed    Transfers Overall transfer level: Needs assistance Equipment used: Rolling walker (2 wheels) Transfers: Sit to/from Stand Sit to Stand: Min guard Stand pivot transfers: Min guard         General transfer comment: min guard for stand in RW, with standing pt reports need to use BSC, pivot transfer to [facility] with min guard    Ambulation/Gait               General Gait Details: deferred due to pt having large BM and needing to transfer back to bed for clean up       Balance Overall balance assessment: Needs assistance Sitting-balance support: No upper extremity supported, Feet supported Sitting balance-Leahy Scale: Good     Standing balance support: Bilateral upper extremity supported, Reliant on assistive device for balance, During functional activity Standing balance-Leahy Scale: Poor Standing balance comment: requires B UE support                            Cognition Arousal/Alertness: Awake/alert Behavior During Therapy: WFL for tasks assessed/performed Overall Cognitive Status: Within Functional Limits for tasks assessed                                             General Comments General comments (skin integrity, edema, etc.): VSS on RA      Pertinent Vitals/Pain Pain Assessment Pain Assessment: Faces Faces Pain Scale: Hurts even more Pain Location:  L foot Pain Descriptors / Indicators: Discomfort, Guarding, Grimacing     PT Goals (current goals can now be found in the care plan section) Acute Rehab PT Goals Patient Stated Goal: home PT Goal Formulation: With patient Time For Goal Achievement: 12/22/22 Potential to Achieve Goals: Fair Progress towards PT goals: Not progressing toward goals - comment    Frequency    Min 3X/week      PT Plan Current  plan remains appropriate       AM-PAC PT "6 Clicks" Mobility   Outcome Measure  Help needed turning from your back to your side while in a flat bed without using bedrails?: None Help needed moving from lying on your back to sitting on the side of a flat bed without using bedrails?: None Help needed moving to and from a bed to a chair (including a wheelchair)?: A Little Help needed standing up from a chair using your arms (e.g., wheelchair or bedside chair)?: A Little Help needed to walk in hospital room?: A Little Help needed climbing 3-5 steps with a railing? : A Lot 6 Click Score: 19    End of Session   Activity Tolerance: Patient tolerated treatment well Patient left: in bed;with nursing/sitter in room Nurse Communication: Mobility status PT Visit Diagnosis: Unsteadiness on feet (R26.81);Other abnormalities of gait and mobility (R26.89);Muscle weakness (generalized) (M62.81);Difficulty in walking, not elsewhere classified (R26.2);Pain Pain - Right/Left: Left Pain - part of body: Ankle and joints of foot     Time: 1771-1657 PT Time Calculation (min) (ACUTE ONLY): 19 min  Charges:  $Therapeutic Activity: 8-22 mins                     Hilding Quintanar B. Migdalia Dk PT, DPT Acute Rehabilitation Services Please use secure chat or  Call Office [phone]    [place] 12/22/2022, 2:47 PM

## 2022-12-22 NOTE — Progress Notes (Signed)
Palliative:  PMT continues to follow along. Goals made clear by patient 1/10 - DNR but open to any other medical interventions offered.  Patient has been transitioned to PO diuretics and nephrology has signed off.  Patient now with concern about L great toe wound -  vascular planning for arteriogram Wednesday 1/15.  Please call PMT with any acute needs.  Will continue to follow chart intermittently.   Juel Burrow, DNP, AGNP-C Palliative Medicine Team Team Phone # [phone]  Pager # [phone]  NO CHARGE

## 2022-12-22 NOTE — Progress Notes (Signed)
[facility] PROGRESS NOTE  LEEONA MCCARDLE (DOB: 1941/10/26) CHE:[id number] PCP: Isaac Bliss, Rayford Halsted, MD  Brief Narrative: [name] is a 82 y.o. female with a history of AML, breast cancer, paroxysmal atrial fibrillation on Eliquis, chronic diastolic heart failure, diabetes mellitus type 2, CKD stage IV. Patient presented secondary to generalized weakness and found to have evidence of UTI, hypocalcemia and acute heart failure. Patient started on calcium replacement, empiric Ceftriaxone and given Lasix. Associated hyperphosphatemia.   With supplementation of calcium and vitamin D, levels have improved. UTI treated. Renal function remains poor but stable and tolerated diuresis with nephrology assistance. Plan now is for arteriogram with CO2 on 1/17.   Subjective: Pain controlled, swelling better, nearer to baseline. No new issues. Ready for vascular procedure, asked if they'd put her under on Wednesday.   Objective: BP (!) 136/55 (BP Location: Left Arm)   Pulse 90   Temp 98 F (36.7 C) (Oral)   Resp 20   Ht '5\' 2"'$  (1.575 m)   Wt 73.5 kg   SpO2 91%   BMI 29.64 kg/m   Gen: No distress Pulm: Clear, nonlabored  CV: RRR, 1+ pitting dependent edema GI: Soft, NT, ND, +BS  Neuro: Alert and oriented. No new focal deficits. Ext: Warm, no deformities. R foot wrapped with dry blood, no active discharge/hemorrhage Skin: No new rashes, lesions or ulcers on visualized skin   Assessment & Plan: Left foot pain: Has wound on great toe after dropping a jar of pennies on it. No cellulitis is evident at this time. No fracture on radiographs. Toe is dusky but warm. Diminished pulses and unable to perform TBI.  - VVS consulted, planning arteriogram 1/17. Holding eliquis x48 hours. - Local wound care per WOC - Continue unna boots - WBAT L foot, can use postop shoe if desired.   Acute cystitis: Urine culture significant for Klebsiella pneumoniae, resistant to ampicillin and nitrofurantoin.    - Completed ceftriaxone, symptoms resolved.  Generalized weakness: Noted on admission. Possibly related to acute illness however, possibly related to hypocalcemia.  - PT/OT recommend home health on discharge.   Acute on chronic diastolic heart failure - Weight down, swelling improved, creatinine stable. Nephrology has signed off, pt remains stable on oral diuretic which we will continue. - Continue strict I/O, daily weights.  - High suspicion for third spacing related to hypoalbuminemia.  - DNR status is confirmed but ongoing goals of care discussions would be helpful. Palliative care consulted, whose assistance is appreciated.   Hypocalcemia: Patient with associated hyperphosphatemia. Likely related to renal impairment.  - Remains low, though overall improved and not severe nor symptomatic. - Continue calcium acetate w/hyperphosphatemia.  - Vitamin D deficiency, level is < 20, supplement 50k units weekly started - PTH 307 consistent with hyperparathyroidism/MBD secondary to vitamin D deficiency and renal failure.   AKI on CKD stage IV: Baseline creatinine of about 2 with more recent creatinine of 4.33. Creatinine of 3.03 on admission and stable-to-slightly-improved since that time. Patient appears overloaded, although she was hypotensive on admission. Renal ultrasound unremarkable.  - Appreciate nephrology assistance.  - Repeat metabolic panel daily.   Anemia of CKD: s/p 1u PRBC 1/6.  - Monitor hgb, up as anticipated.  Hyperphosphatemia: Likely secondary to kidney disease. Discussed with nephrology who recommended transition to calcium acetate.  - Calcium acetate - Renal diet   Hypomagnesemia - Improved. Will continue monitoring.   Metabolic acidosis: Due to renal impairment - Improved, will continue bicarbonate tablets.  Constipation:  - Senokot daily, sorbitol prn. Continue this to limit further impaction - s/p disimpaction and soap suds enema 1/8  Hyperlipidemia -  Continue atorvastatin   T2DM: Chronically uncontrolled with hyperglycemia (HbA1c 9.9%) acutely hypoglycemic on 1/1.  - Continue SSI (stopped basal) - HHRN will be ordered at DC (as well as PT/OT/aide)   Paroxysmal atrial fibrillation -Continue Eliquis   History of seizures - Continue Keppra    Abnormal right ABI: Noncompressible arteries, abnormal TBI of 0.26.  - Vascular consulted.  AML: In remission. Patient follows with Dr. [name] as an outpatient.   CAD: Patient with a history of NSTEMI, treated medically.  - Continue plavix, atorvastatin - Can restart BB, imdur if BP trends continue improving.  Patrecia Pour, MD [facility] [email or web address] 12/22/2022, 5:53 PM

## 2022-12-23 DIAGNOSIS — N3000 Acute cystitis without hematuria: Secondary | ICD-10-CM | POA: Diagnosis not present

## 2022-12-23 LAB — GLUCOSE, CAPILLARY
Glucose-Capillary: 271 mg/dL — ABNORMAL HIGH (ref 70–99)
Glucose-Capillary: 91 mg/dL (ref 70–99)

## 2022-12-23 NOTE — Progress Notes (Signed)
[facility] PROGRESS NOTE  [name] (DOB: 1941/12/07) BUL:[id number] PCP: [name]  Brief Narrative: [name] is a 82 y.o. female with a history of AML, breast cancer, paroxysmal atrial fibrillation on Eliquis, chronic diastolic heart failure, diabetes mellitus type 2, CKD stage IV. Patient presented secondary to generalized weakness and found to have evidence of UTI, hypocalcemia and acute heart failure. Patient started on calcium replacement, empiric Ceftriaxone and given Lasix. Associated hyperphosphatemia.   With supplementation of calcium and vitamin D, levels have improved. UTI treated. Renal function remains poor but stable and tolerated diuresis with nephrology assistance. Plan now is for arteriogram with CO2 on 1/17.   Subjective: No new complaints.  Objective: BP 137/69   Pulse 89   Temp 97.6 F (36.4 C) (Oral)   Resp 19   Ht '5\' 2"'$  (1.575 m)   Wt 66.8 kg   SpO2 100%   BMI 26.94 kg/m   Gen: No distress, laying flat Pulm: Nonlabored, no crackles  CV: RRR, pitting edema is stable, ~1+ GI: Soft, NT, ND, +BS  Neuro: Alert and oriented. No new focal deficits. Ext: Warm, no new deformities. Unna boots in place.  Skin: No new rashes, lesions or ulcers on visualized skin   Assessment & Plan: Left foot pain: Has wound on great toe after dropping a jar of pennies on it. No cellulitis is evident at this time. No fracture on radiographs. Toe is dusky but warm. Diminished pulses and unable to perform TBI.  - VVS consulted, planning arteriogram 1/17. Holding eliquis x48 hours. - Local wound care per WOC - Continue unna boots - WBAT L foot, can use postop shoe if desired.   Acute cystitis: Urine culture significant for Klebsiella pneumoniae, resistant to ampicillin and nitrofurantoin.   - Completed ceftriaxone, symptoms resolved.  Generalized weakness: Noted on admission. Possibly related to acute illness however, possibly related to  hypocalcemia.  - PT/OT recommend home health on discharge.   Acute on chronic diastolic heart failure - Weight down, swelling improved, creatinine stable. Nephrology has signed off, pt remains stable on oral diuretic which we will continue. - Continue strict I/O, daily weights. No output documented in past 24 hrs, though weight down to 66kg? - High suspicion for third spacing related to hypoalbuminemia.  - DNR status is confirmed but ongoing goals of care discussions would be helpful. Palliative care consulted, whose assistance is appreciated.   Hypocalcemia: Patient with associated hyperphosphatemia. Likely related to renal impairment.  - Remains low, though overall improved and not severe nor symptomatic. - Continue calcium acetate w/hyperphosphatemia.  - Vitamin D deficiency, level is < 20, supplement 50k units weekly started - PTH 307 consistent with hyperparathyroidism/MBD secondary to vitamin D deficiency and renal failure.   AKI on CKD stage IV: Baseline creatinine of about 2 with more recent creatinine of 4.33. Creatinine of 3.03 on admission and stable-to-slightly-improved since that time. Patient appears overloaded, although she was hypotensive on admission. Renal ultrasound unremarkable.  - Appreciate nephrology assistance.  - Repeat metabolic panel in AM.  Anemia of CKD: s/p 1u PRBC 1/6.  - Monitor hgb, up as anticipated.  Hyperphosphatemia: Likely secondary to kidney disease. Discussed with nephrology who recommended transition to calcium acetate.  - Calcium acetate - Renal diet   Hypomagnesemia - Improved. Will continue monitoring.   Metabolic acidosis: Due to renal impairment - Improved, will continue bicarbonate tablets.   Constipation:  - Senokot daily, sorbitol prn. Continue this to limit further impaction -  s/p disimpaction and soap suds enema 1/8  Hyperlipidemia - Continue atorvastatin   T2DM: Chronically uncontrolled with hyperglycemia (HbA1c 9.9%) acutely  hypoglycemic on 1/1.  - Continue SSI (stopped basal) - HHRN will be ordered at DC (as well as PT/OT/aide)   Paroxysmal atrial fibrillation -Continue Eliquis   History of seizures - Continue Keppra    Abnormal right ABI: Noncompressible arteries, abnormal TBI of 0.26.  - Vascular consulted.  AML: In remission. Patient follows with [name] as an outpatient.   CAD: Patient with a history of NSTEMI, treated medically.  - Continue plavix, atorvastatin - Can restart BB, imdur if BP trends continue improving.  Catherine Pour, [name] [facility] [email or web address] 12/23/2022, 2:29 PM

## 2022-12-23 NOTE — Progress Notes (Addendum)
  Progress Note    12/23/2022 7:05 AM * No surgery date entered *  Patient with Critical limb ischemia with tissue loss to left great toe Scheduled for Angiogram BLE possible intervention on LLE tomorrow 12/24/22 with Dr. Virl Cagey NPO after midnight Consent order placed  Corrina Eveleth, PA-C [facility] [phone] 12/23/2022 7:05 AM  I have seen and evaluated the patient. I agree with the PA note as documented above.  Plan aortogram with lower extremity arteriogram tomorrow with Dr. Virl Cagey in the Cath Lab with focus on left leg.  Please hold DOAC.  N.p.o. after midnight.  Most of this will be done with CO2 given her kidney dysfunction with AKI on CKD stage IV as I discussed with her.  Marty Heck, MD [facility] [place] Office: [phone]

## 2022-12-23 NOTE — Plan of Care (Signed)
  Problem: Clinical Measurements: ?Goal: Ability to maintain clinical measurements within normal limits will improve ?Outcome: Progressing ?Goal: Will remain free from infection ?Outcome: Progressing ?Goal: Diagnostic test results will improve ?Outcome: Progressing ?Goal: Respiratory complications will improve ?Outcome: Progressing ?Goal: Cardiovascular complication will be avoided ?Outcome: Progressing ?  ?

## 2022-12-23 NOTE — Progress Notes (Signed)
Mobility Specialist - Progress Note   12/23/22 1121  Mobility  Activity Ambulated with assistance in room  Level of Assistance Minimal assist, patient does 75% or more  Assistive Device Front wheel walker  Distance Ambulated (ft) 10 ft  LLE Weight Bearing WBAT  Mobility Referral Yes  $Mobility charge 1 Mobility   Pt was received in bed and agreeable to session. No complaints throughout session. Pt refused getting to chair stating that it was uncomfortable. Pt returned to bed with all needs met.   Franki Monte  Mobility Specialist Please contact via Solicitor or Rehab office at [phone]

## 2022-12-23 NOTE — Progress Notes (Signed)
Occupational Therapy Treatment Patient Details Name: [name] MRN: [id number] DOB: [birth year] Today's Date: 12/23/2022   History of present illness 82 y.o. female admitted to [facility] on 12/06/2022 with suspected acute cystitis after presenting from home to [facility] ED complaining of generalized weakness and fall. PMH: AML, breast cancer, paroxysmal atrial fibrillation chronically anticoagulated on Eliquis, chronic diastolic heart failure, type 2 diabetes mellitus, CKD 4 associated baseline creatinine 1.8-3.0, anemia of chronic disease associated baseline hemoglobin 8-10,   OT comments  Pt progressing towards goals, completing transfers and in-room ambulation with supervision using RW. Pt needing max A for LB ADLs. VSS on RA throughout session. Pt presenting with impairments listed below, will follow acutely. Continue to recommend HHOT at d/c.   Recommendations for follow up therapy are one component of a multi-disciplinary discharge planning process, led by the attending physician.  Recommendations may be updated based on patient status, additional functional criteria and insurance authorization.    Follow Up Recommendations  Home health OT     Assistance Recommended at Discharge Set up Supervision/Assistance  Patient can return home with the following  A little help with walking and/or transfers;Assistance with cooking/housework;Assist for transportation;Help with stairs or ramp for entrance;Direct supervision/assist for medications management;Direct supervision/assist for financial management;A little help with bathing/dressing/bathroom   Equipment Recommendations  BSC/3in1    Recommendations for Other Services PT consult    Precautions / Restrictions Precautions Precautions: Fall Restrictions Weight Bearing Restrictions: No LLE Weight Bearing: Weight bearing as tolerated       Mobility Bed Mobility Overal bed mobility: Needs Assistance Bed Mobility: Sidelying to  Sit, Sit to Sidelying Rolling: Supervision Sidelying to sit: Supervision            Transfers Overall transfer level: Needs assistance Equipment used: Rolling walker (2 wheels) Transfers: Sit to/from Stand Sit to Stand: Supervision                 Balance Overall balance assessment: Needs assistance Sitting-balance support: No upper extremity supported, Feet supported Sitting balance-Leahy Scale: Good     Standing balance support: Bilateral upper extremity supported, Reliant on assistive device for balance, During functional activity Standing balance-Leahy Scale: Poor Standing balance comment: requires B UE support                           ADL either performed or assessed with clinical judgement   ADL Overall ADL's : Needs assistance/impaired                         Toilet Transfer: Min guard;Rolling walker (2 wheels);Ambulation;Regular Toilet           Functional mobility during ADLs: Min guard;Rolling walker (2 wheels)      Extremity/Trunk Assessment Upper Extremity Assessment Upper Extremity Assessment: Generalized weakness   Lower Extremity Assessment Lower Extremity Assessment: Defer to PT evaluation        Vision   Vision Assessment?: No apparent visual deficits   Perception Perception Perception: Not tested   Praxis Praxis Praxis: Not tested    Cognition Arousal/Alertness: Awake/alert Behavior During Therapy: WFL for tasks assessed/performed Overall Cognitive Status: Within Functional Limits for tasks assessed                                          Exercises  Shoulder Instructions       General Comments VSS on RA    Pertinent Vitals/ Pain       Pain Assessment Pain Assessment: No/denies pain  Home Living                                          Prior Functioning/Environment              Frequency  Min 2X/week        Progress Toward Goals  OT  Goals(current goals can now be found in the care plan section)  Progress towards OT goals: Progressing toward goals  Acute Rehab OT Goals Patient Stated Goal: none stated OT Goal Formulation: With patient Time For Goal Achievement: 01/06/23 Potential to Achieve Goals: Good ADL Goals Pt Will Perform Lower Body Dressing: with modified independence;with adaptive equipment;sit to/from stand;sitting/lateral leans Pt Will Transfer to Toilet: with modified independence;ambulating;regular height toilet Pt Will Perform Tub/Shower Transfer: Tub transfer;Shower transfer;with modified independence;ambulating;tub bench;rolling walker  Plan Discharge plan remains appropriate;Frequency remains appropriate    Co-evaluation                 AM-PAC OT "6 Clicks" Daily Activity     Outcome Measure   Help from another person eating meals?: None Help from another person taking care of personal grooming?: A Little Help from another person toileting, which includes using toliet, bedpan, or urinal?: A Lot Help from another person bathing (including washing, rinsing, drying)?: A Lot Help from another person to put on and taking off regular upper body clothing?: A Little Help from another person to put on and taking off regular lower body clothing?: A Lot 6 Click Score: 16    End of Session Equipment Utilized During Treatment: Oxygen  OT Visit Diagnosis: Unsteadiness on feet (R26.81);Other abnormalities of gait and mobility (R26.89);Muscle weakness (generalized) (M62.81);History of falling (Z91.81)   Activity Tolerance Patient tolerated treatment well   Patient Left in bed;with call bell/phone within reach;with bed alarm set   Nurse Communication Mobility status        Time: 0912-0930 OT Time Calculation (min): 18 min  Charges: OT General Charges $OT Visit: 1 Visit OT Treatments $Therapeutic Activity: 8-22 mins  Renaye Rakers, OTD, OTR/L SecureChat Preferred Acute Rehab [phone]  [place] 12/23/2022, 11:15 AM

## 2022-12-24 ENCOUNTER — Encounter (HOSPITAL_COMMUNITY)
Admission: EM | Disposition: A | Discharge status: Home Health | Payer: Self-pay | Source: Home / Self Care | Attending: Family Medicine

## 2022-12-24 ENCOUNTER — Other Ambulatory Visit (HOSPITAL_COMMUNITY): Payer: Medicare HMO

## 2022-12-24 ENCOUNTER — Inpatient Hospital Stay (HOSPITAL_COMMUNITY): Payer: Medicare HMO

## 2022-12-24 DIAGNOSIS — N189 Chronic kidney disease, unspecified: Secondary | ICD-10-CM | POA: Diagnosis not present

## 2022-12-24 DIAGNOSIS — I70245 Atherosclerosis of native arteries of left leg with ulceration of other part of foot: Secondary | ICD-10-CM

## 2022-12-24 DIAGNOSIS — Z862 Personal history of diseases of the blood and blood-forming organs and certain disorders involving the immune mechanism: Secondary | ICD-10-CM

## 2022-12-24 DIAGNOSIS — I739 Peripheral vascular disease, unspecified: Secondary | ICD-10-CM | POA: Diagnosis not present

## 2022-12-24 HISTORY — PX: ABDOMINAL AORTOGRAM W/LOWER EXTREMITY: CATH118223

## 2022-12-24 HISTORY — PX: PERIPHERAL VASCULAR BALLOON ANGIOPLASTY: CATH118281

## 2022-12-24 LAB — BASIC METABOLIC PANEL
Anion gap: 9 (ref 5–15)
BUN: 76 mg/dL — ABNORMAL HIGH (ref 8–23)
CO2: 32 mmol/L (ref 22–32)
Calcium: 7 mg/dL — ABNORMAL LOW (ref 8.9–10.3)
Chloride: 100 mmol/L (ref 98–111)
Creatinine, Ser: 2.61 mg/dL — ABNORMAL HIGH (ref 0.44–1.00)
GFR, Estimated: 18 mL/min — ABNORMAL LOW (ref >60)
Glucose, Bld: 309 mg/dL — ABNORMAL HIGH (ref 70–99)
Potassium: 4.4 mmol/L (ref 3.5–5.1)
Sodium: 141 mmol/L (ref 135–145)

## 2022-12-24 LAB — GLUCOSE, CAPILLARY
Glucose-Capillary: 113 mg/dL — ABNORMAL HIGH (ref 70–99)
Glucose-Capillary: 285 mg/dL — ABNORMAL HIGH (ref 70–99)
Glucose-Capillary: 261 mg/dL — ABNORMAL HIGH (ref 70–99)
Glucose-Capillary: 61 mg/dL — ABNORMAL LOW (ref 70–99)
Glucose-Capillary: 71 mg/dL (ref 70–99)
Glucose-Capillary: 117 mg/dL — ABNORMAL HIGH (ref 70–99)
Glucose-Capillary: 133 mg/dL — ABNORMAL HIGH (ref 70–99)

## 2022-12-24 LAB — CBC
HCT: 24.6 % — ABNORMAL LOW (ref 36.0–46.0)
Hemoglobin: 7.6 g/dL — ABNORMAL LOW (ref 12.0–15.0)
MCH: 30.9 pg (ref 26.0–34.0)
MCHC: 30.9 g/dL (ref 30.0–36.0)
MCV: 100 fL (ref 80.0–100.0)
Platelets: 161 10*3/uL (ref 150–400)
RBC: 2.46 MIL/uL — ABNORMAL LOW (ref 3.87–5.11)
RDW: 15.2 % (ref 11.5–15.5)
WBC: 7.1 10*3/uL (ref 4.0–10.5)
nRBC: 0 % (ref 0.0–0.2)

## 2022-12-24 LAB — POCT ACTIVATED CLOTTING TIME
Activated Clotting Time: 244 seconds
Activated Clotting Time: 190 seconds
Activated Clotting Time: 168 seconds
Activated Clotting Time: 179 seconds

## 2022-12-24 SURGERY — ABDOMINAL AORTOGRAM W/LOWER EXTREMITY
Anesthesia: LOCAL | Laterality: Bilateral

## 2022-12-24 MED ORDER — HYDRALAZINE HCL 20 MG/ML IJ SOLN: 5.0000 mg | INTRAMUSCULAR | Status: DC | PRN

## 2022-12-24 MED ORDER — MIDAZOLAM HCL 2 MG/2ML IJ SOLN
INTRAMUSCULAR | Status: DC | PRN
  Administered 2022-12-24: 1 mg via INTRAVENOUS

## 2022-12-24 MED ORDER — FENTANYL CITRATE (PF) 100 MCG/2ML IJ SOLN
INTRAMUSCULAR | Status: AC
  Filled 2022-12-24: qty 2

## 2022-12-24 MED ORDER — HEPARIN (PORCINE) IN NACL 1000-0.9 UT/500ML-% IV SOLN
INTRAVENOUS | Status: AC
  Filled 2022-12-24: qty 1000

## 2022-12-24 MED ORDER — ACETAMINOPHEN 325 MG PO TABS
650.0000 mg | ORAL_TABLET | ORAL | Status: DC | PRN
  Administered 2022-12-25 – 2022-12-26 (×2): 650 mg via ORAL

## 2022-12-24 MED ORDER — HEPARIN SODIUM (PORCINE) 1000 UNIT/ML IJ SOLN
INTRAMUSCULAR | Status: AC
  Filled 2022-12-24: qty 10

## 2022-12-24 MED ORDER — APIXABAN 2.5 MG PO TABS
2.5000 mg | ORAL_TABLET | Freq: Two times a day (BID) | ORAL | Status: DC
  Administered 2022-12-24 – 2022-12-26 (×4): 2.5 mg via ORAL
  Filled 2022-12-24 (×4): qty 1

## 2022-12-24 MED ORDER — ONDANSETRON HCL 4 MG/2ML IJ SOLN: 4.0000 mg | Freq: Four times a day (QID) | INTRAMUSCULAR | Status: DC | PRN

## 2022-12-24 MED ORDER — SODIUM CHLORIDE 0.9 % WEIGHT BASED INFUSION: 1.0000 mL/kg/h | INTRAVENOUS | Status: AC

## 2022-12-24 MED ORDER — DEXTROSE 50 % IV SOLN
INTRAVENOUS | Status: AC
  Filled 2022-12-24: qty 50

## 2022-12-24 MED ORDER — SODIUM CHLORIDE 0.9 % IV SOLN: 250.0000 mL | INTRAVENOUS | Status: DC | PRN

## 2022-12-24 MED ORDER — MIDAZOLAM HCL 2 MG/2ML IJ SOLN
INTRAMUSCULAR | Status: AC
  Filled 2022-12-24: qty 2

## 2022-12-24 MED ORDER — FENTANYL CITRATE (PF) 100 MCG/2ML IJ SOLN
INTRAMUSCULAR | Status: DC | PRN
  Administered 2022-12-24: 50 ug via INTRAVENOUS

## 2022-12-24 MED ORDER — LABETALOL HCL 5 MG/ML IV SOLN: 10.0000 mg | INTRAVENOUS | Status: DC | PRN

## 2022-12-24 MED ORDER — HEPARIN (PORCINE) IN NACL 1000-0.9 UT/500ML-% IV SOLN
INTRAVENOUS | Status: DC | PRN
  Administered 2022-12-24 (×2): 500 mL

## 2022-12-24 MED ORDER — LIDOCAINE HCL (PF) 1 % IJ SOLN
INTRAMUSCULAR | Status: AC
  Filled 2022-12-24: qty 30

## 2022-12-24 MED ORDER — SODIUM CHLORIDE 0.9% FLUSH
3.0000 mL | Freq: Two times a day (BID) | INTRAVENOUS | Status: DC
  Administered 2022-12-25: 3 mL via INTRAVENOUS

## 2022-12-24 MED ORDER — SODIUM CHLORIDE 0.9% FLUSH: 3.0000 mL | INTRAVENOUS | Status: DC | PRN

## 2022-12-24 MED ORDER — DEXTROSE 50 % IV SOLN
12.5000 g | INTRAVENOUS | Status: AC
  Administered 2022-12-24: 12.5 g via INTRAVENOUS

## 2022-12-24 MED ORDER — SODIUM CHLORIDE 0.9 % IV SOLN: INTRAVENOUS | Status: DC

## 2022-12-24 MED ORDER — LIDOCAINE HCL (PF) 1 % IJ SOLN
INTRAMUSCULAR | Status: DC | PRN
  Administered 2022-12-24: 15 mL

## 2022-12-24 MED ORDER — HEPARIN SODIUM (PORCINE) 1000 UNIT/ML IJ SOLN
INTRAMUSCULAR | Status: DC | PRN
  Administered 2022-12-24: 7000 [IU] via INTRAVENOUS

## 2022-12-24 MED ORDER — HEPARIN SODIUM (PORCINE) 5000 UNIT/ML IJ SOLN: 5000.0000 [IU] | Freq: Three times a day (TID) | INTRAMUSCULAR | Status: DC

## 2022-12-24 SURGICAL SUPPLY — 17 items
BALLN IN.PACT DCB 5X40 (BALLOONS) ×2
BALLN MUSTANG 4X60X135 (BALLOONS) ×2
BALLOON MUSTANG 4X60X135 (BALLOONS) IMPLANT
CATH CXI SUPP ST 4FR 90CM (CATHETERS) IMPLANT
CATH OMNI FLUSH 5F 65CM (CATHETERS) IMPLANT
DCB IN.PACT 5X40 (BALLOONS) IMPLANT
DEVICE CONTINUOUS FLUSH (MISCELLANEOUS) IMPLANT
GLIDEWIRE ADV .035X260CM (WIRE) IMPLANT
KIT ANGIASSIST CO2 SYSTEM (KITS) IMPLANT
KIT ENCORE 26 ADVANTAGE (KITS) IMPLANT
KIT PV (KITS) ×3 IMPLANT
SHEATH CATAPULT 6F 45 RDC (SHEATH) IMPLANT
SHEATH PINNACLE 6F 10CM (SHEATH) IMPLANT
STOPCOCK MORSE 400PSI 3WAY (MISCELLANEOUS) IMPLANT
TRANSDUCER W/STOPCOCK (MISCELLANEOUS) ×3 IMPLANT
TRAY PV CATH (CUSTOM PROCEDURE TRAY) ×3 IMPLANT
WIRE BENTSON .035X145CM (WIRE) IMPLANT

## 2022-12-24 NOTE — Progress Notes (Signed)
  Progress Note    12/24/2022 Day of Surgery  Patient with Critical limb ischemia with tissue loss to left great toe Scheduled for Angiogram BLE possible intervention on LLE today. Pt aware this will require a small amount of contrast that could cause renal failure. After discussing the risks and benefits, [name] elected to proceed.    Broadus John [facility] [phone] 12/24/2022 3:25 PM

## 2022-12-24 NOTE — Progress Notes (Signed)
[facility] PROGRESS NOTE   [name] IRW:[id number] DOB: 16-May-1941 DOA: 12/06/2022  PCP: Isaac Bliss, Rayford Halsted, MD  Brief History/Interval Summary:  82 y.o. female with a history of AML, breast cancer, paroxysmal atrial fibrillation on Eliquis, chronic diastolic heart failure, diabetes mellitus type 2, CKD stage IV. Patient presented secondary to generalized weakness and found to have evidence of UTI, hypocalcemia and acute heart failure. Patient started on calcium replacement, empiric Ceftriaxone and given Lasix. Associated hyperphosphatemia.    With supplementation of calcium and vitamin D, levels have improved. UTI treated. Renal function remains poor but stable and tolerated diuresis with nephrology assistance. Plan now is for arteriogram with CO2 on 1/17.     Subjective/Interval History: Patient complains of some difficulty breathing this morning.  Denies any chest pain, nausea or vomiting.     Assessment/Plan:  Left foot pain: Has wound on great toe after dropping a jar of pennies on it.  There was no evidence for infection.  No fractures noted on x-rays. Vascular surgery was consulted.  Plan is for an arteriogram on 1/17.  Eliquis currently on hold.  - Local wound care per WOC - Continue unna boots - WBAT L foot, can use postop shoe if desired.    Acute cystitis Urine culture significant for Klebsiella pneumoniae, resistant to ampicillin and nitrofurantoin.   Completed ceftriaxone, symptoms resolved.   Generalized weakness Noted on admission. Possibly related to acute illness however, possibly related to hypocalcemia.  - PT/OT recommend home health on discharge.   Acute on chronic diastolic heart failure Patient was diuresed.  Weight has decreased.  Noted to be on once daily furosemide. Continue strict ins and outs and daily weights.   Hypocalcemia Patient with associated hyperphosphatemia. Likely related to renal impairment.  - Remains low, though  overall improved and not severe nor symptomatic. - Continue calcium acetate w/hyperphosphatemia.  - Vitamin D deficiency, level is < 20, supplement 50k units weekly started - PTH 307 consistent with hyperparathyroidism/MBD secondary to vitamin D deficiency and renal failure.   AKI on CKD stage IV Baseline creatinine of about 2 with more recent creatinine of 4.33. Creatinine of 3.03 on admission and stable-to-slightly-improved since that time.  Patient was somewhat fluid overloaded.  Currently on once daily furosemide.  Seen by nephrology during this hospital stay.  Renal function stable for the most part.  Creatinine noted to be 2.61 today.  Monitor urine output.  Avoid nephrotoxic agents.  Recheck labs tomorrow.     Anemia of CKD s/p 1u PRBC 1/6.  Hemoglobin has drifted down slightly.  Continue to monitor for now.  No evidence of overt bleeding.   Hyperphosphatemia Likely secondary to kidney disease. Discussed with nephrology who recommended transition to calcium acetate.  - Calcium acetate - Renal diet   Hypomagnesemia - Improved.    Metabolic acidosis Due to renal impairment Bicarbonate levels have improved.  Now off of bicarbonate tablets.   Constipation:  - Senokot daily, sorbitol prn. Continue this to limit further impaction - s/p disimpaction and soap suds enema 1/8   Hyperlipidemia - Continue atorvastatin   T2DM Chronically uncontrolled with hyperglycemia (HbA1c 9.9%) acutely hypoglycemic on 1/1.  - Continue SSI (stopped basal) - HHRN will be ordered at DC (as well as PT/OT/aide)   Paroxysmal atrial fibrillation Noted to be on metoprolol.  Eliquis currently on hold so that she can undergo arteriogram.   History of seizures - Continue Keppra  AML In remission. Patient follows with Dr. [name] as  an outpatient.   CAD Patient with a history of NSTEMI, treated medically.  - Continue plavix, atorvastatin   DVT Prophylaxis: Eliquis on hold Code Status:  DNR Family Communication: Discussed with patient Disposition Plan: Home health when medically stable  Status is: Inpatient Remains inpatient appropriate because: Concerns for lower extremity ischemia      Medications: Scheduled:  atorvastatin  40 mg Oral Daily   calcium acetate  1,334 mg Oral TID WC   clopidogrel  75 mg Oral QPM   furosemide  40 mg Oral Daily   insulin aspart  0-9 Units Subcutaneous TID WC   isosorbide mononitrate  30 mg Oral Daily   leptospermum manuka honey  1 Application Topical Daily   letrozole  2.5 mg Oral Daily   levETIRAcetam  500 mg Oral BID   metoprolol succinate  12.5 mg Oral Daily   mupirocin ointment   Topical Daily   polyethylene glycol  17 g Oral BID   senna-docusate  1 tablet Oral BID   Vitamin D (Ergocalciferol)  50,000 Units Oral Q7 days   Continuous:  sodium chloride     VCB:SWHQPRFFMBWGY **OR** acetaminophen, bisacodyl, LORazepam, melatonin, naLOXone (NARCAN)  injection, oxyCODONE-acetaminophen, polyethylene glycol, sorbitol  Antibiotics: Anti-infectives (From admission, onward)    Start     Dose/Rate Route Frequency Ordered Stop   12/07/22 2200  cefTRIAXone (ROCEPHIN) 1 g in sodium chloride 0.9 % 100 mL IVPB  Status:  Discontinued        1 g 200 mL/hr over 30 Minutes Intravenous Every 24 hours 12/07/22 0301 12/09/22 1220   12/07/22 0145  cefTRIAXone (ROCEPHIN) 2 g in sodium chloride 0.9 % 100 mL IVPB        2 g 200 mL/hr over 30 Minutes Intravenous  Once 12/07/22 0136 12/07/22 0540       Objective:  Vital Signs  Vitals:   12/24/22 0500 12/24/22 0821 12/24/22 0846 12/24/22 0851  BP:  (!) 149/120  (!) 132/57  Pulse:  84 91 78  Resp:  18    Temp:  98 F (36.7 C)    TempSrc:  Oral    SpO2:  100%  100%  Weight: 71.2 kg     Height:        Intake/Output Summary (Last 24 hours) at 12/24/2022 1139 Last data filed at 12/24/2022 0845 Gross per 24 hour  Intake --  Output 550 ml  Net -550 ml   Filed Weights   12/22/22 0543  12/23/22 0426 12/24/22 0500  Weight: 73.5 kg 66.8 kg 71.2 kg    General appearance: Awake alert.  In no distress Resp: Clear to auscultation bilaterally.  Normal effort Cardio: S1-S2 is normal regular.  No S3-S4.  No rubs murmurs or bruit GI: Abdomen is soft.  Nontender nondistended.  Bowel sounds are present normal.  No masses organomegaly   Lab Results:  Data Reviewed: I have personally reviewed following labs and reports of the imaging studies  CBC: Recent Labs  Lab 12/19/22 0303 12/20/22 0639 12/21/22 0242 12/22/22 0310 12/24/22 0512  WBC 8.9 9.6 8.3 8.7 7.1  NEUTROABS 6.9 8.0* 6.6 6.6  --   HGB 8.8* 8.3* 8.0* 7.5* 7.6*  HCT 27.4* 25.9* 25.4* 24.8* 24.6*  MCV 95.8 96.3 96.9 100.4* 100.0  PLT 157 149* 157 154 659    Basic Metabolic Panel: Recent Labs  Lab 12/18/22 0622 12/19/22 0303 12/20/22 0639 12/21/22 0242 12/22/22 0310 12/24/22 0512  NA 140 139 140 142 141 141  K 3.8 3.6  3.4* 3.8 4.3 4.4  CL 99 96* 95* 97* 97* 100  CO2 32 33* 32 33* 33* 32  GLUCOSE 269* 172* 197* 256* 175* 309*  BUN 69* 67* 72* 72* 71* 76*  CREATININE 2.55* 2.53* 2.39* 2.24* 2.25* 2.61*  CALCIUM 7.2* 7.2* 7.1* 7.2* 7.2* 7.0*  MG 2.0 1.8 1.9 1.9 1.9  --   PHOS 3.7 3.3 3.4 3.2 3.5  --     GFR: Estimated Creatinine Clearance: 15.6 mL/min (A) (by C-G formula based on SCr of 2.61 mg/dL (H)).  Liver Function Tests: Recent Labs  Lab 12/18/22 0622 12/19/22 0303 12/20/22 0639 12/21/22 0242 12/22/22 0310  AST  --  17  --  18 18  ALT  --  11  --  10 11  ALKPHOS  --  108  --  99 103  BILITOT  --  0.6  --  0.5 0.6  PROT  --  5.4*  --  5.1* 5.1*  ALBUMIN 2.4* 2.5* 2.4* 2.4* 2.4*     CBG: Recent Labs  Lab 12/22/22 1605 12/22/22 2139 12/23/22 0825 12/23/22 1200 12/24/22 0821  GLUCAP 165* 190* 271* 91 261*     Radiology Studies: No results found.     LOS: 16 days   Shermika Balthaser [employer] on [email or web address]  12/24/2022, 11:39 AM

## 2022-12-24 NOTE — Progress Notes (Signed)
Physical Therapy Treatment Patient Details Name: [name] MRN: [id number] DOB: [birth year] Today's Date: 12/24/2022   History of Present Illness 82 y.o. female admitted to [facility] on 12/06/2022 with suspected acute cystitis after presenting from home to [place] ED complaining of generalized weakness and fall. PMH: AML, breast cancer, paroxysmal atrial fibrillation chronically anticoagulated on Eliquis, chronic diastolic heart failure, type 2 diabetes mellitus, CKD 4 associated baseline creatinine 1.8-3.0, anemia of chronic disease associated baseline hemoglobin 8-10,    PT Comments    Pt on BSC on entry. PT assisted with pericare. Pt eager to ambulate in room prior to her procedure. Pt is min guard for transfers and ambulation with RW. Treatment session limited due to arrival of radiology staff. D/c plans remain appropriate. PT will continue to follow acutely    Recommendations for follow up therapy are one component of a multi-disciplinary discharge planning process, led by the attending physician.  Recommendations may be updated based on patient status, additional functional criteria and insurance authorization.  Follow Up Recommendations  Home health PT     Assistance Recommended at Discharge Intermittent Supervision/Assistance  Patient can return home with the following A little help with walking and/or transfers;A little help with bathing/dressing/bathroom;Assistance with cooking/housework;Direct supervision/assist for medications management;Direct supervision/assist for financial management;Assist for transportation;Help with stairs or ramp for entrance   Equipment Recommendations  None recommended by PT       Precautions / Restrictions Precautions Precautions: Fall Restrictions Weight Bearing Restrictions: No LLE Weight Bearing: Weight bearing as tolerated     Mobility  Bed Mobility Overal bed mobility: Needs Assistance Bed Mobility: Sit to Supine       Sit to  supine: Min assist   General bed mobility comments: min A for bringing LE back into bed    Transfers Overall transfer level: Needs assistance Equipment used: Rolling walker (2 wheels) Transfers: Sit to/from Stand Sit to Stand: Supervision Stand pivot transfers: Min guard         General transfer comment: supervision to come to standing from [facility] for pericare and from Bed to RW for ambulation, min guard for safety for stand step transfer from [facility] to bed    Ambulation/Gait Ambulation/Gait assistance: Min guard Gait Distance (Feet): 40 Feet Assistive device: Rolling walker (2 wheels) Gait Pattern/deviations: Step-through pattern, Decreased stride length, Trunk flexed Gait velocity: decreased Gait velocity interpretation: <1.31 ft/sec, indicative of household ambulator   General Gait Details: min guard fo safety with ambulation to door and back x2         Balance Overall balance assessment: Needs assistance Sitting-balance support: No upper extremity supported, Feet supported Sitting balance-Leahy Scale: Good     Standing balance support: Bilateral upper extremity supported, Reliant on assistive device for balance, During functional activity Standing balance-Leahy Scale: Poor Standing balance comment: requires B UE support                            Cognition Arousal/Alertness: Awake/alert Behavior During Therapy: WFL for tasks assessed/performed Overall Cognitive Status: Within Functional Limits for tasks assessed                                             General Comments General comments (skin integrity, edema, etc.): VSS on RA, SoB with bringing LE back into bed  Pertinent Vitals/Pain Pain Assessment Pain Assessment: Faces Faces Pain Scale: Hurts little more Pain Location: L foot Pain Descriptors / Indicators: Discomfort, Guarding, Grimacing Pain Intervention(s): Limited activity within patient's tolerance, Monitored during  session, Repositioned     PT Goals (current goals can now be found in the care plan section) Acute Rehab PT Goals Patient Stated Goal: home PT Goal Formulation: With patient Time For Goal Achievement: 12/22/22 Potential to Achieve Goals: Fair Progress towards PT goals: Progressing toward goals    Frequency    Min 3X/week      PT Plan Current plan remains appropriate       AM-PAC PT "6 Clicks" Mobility   Outcome Measure  Help needed turning from your back to your side while in a flat bed without using bedrails?: None Help needed moving from lying on your back to sitting on the side of a flat bed without using bedrails?: None Help needed moving to and from a bed to a chair (including a wheelchair)?: A Little Help needed standing up from a chair using your arms (e.g., wheelchair or bedside chair)?: A Little Help needed to walk in hospital room?: A Little Help needed climbing 3-5 steps with a railing? : A Lot 6 Click Score: 19    End of Session Equipment Utilized During Treatment: Gait belt Activity Tolerance: Patient tolerated treatment well Patient left: in bed;with bed alarm set;with call bell/phone within reach Nurse Communication: Mobility status PT Visit Diagnosis: Unsteadiness on feet (R26.81);Other abnormalities of gait and mobility (R26.89);Muscle weakness (generalized) (M62.81);Difficulty in walking, not elsewhere classified (R26.2);Pain Pain - Right/Left: Left Pain - part of body: Ankle and joints of foot     Time: 6144-3154 PT Time Calculation (min) (ACUTE ONLY): 11 min  Charges:  $Therapeutic Exercise: 8-22 mins                     [name] B. Migdalia Dk PT, DPT Acute Rehabilitation Services Please use secure chat or  Call Office [phone]    [place] 12/24/2022, 11:28 AM

## 2022-12-24 NOTE — Progress Notes (Signed)
1730: ACT checked, within parameters to remove sheath. Sheath removed, pressure being held.  1750: Hemostasis achieved, Site is CDI. No rebleeding occurring. Dressing applied. Patient stated discomfort at site, but stated "I just want to eat". Educated on femoral sheath removal instructions and bedrest.

## 2022-12-24 NOTE — Progress Notes (Addendum)
Right groin dressing with leaking around sheath, thin watery, no frank heme.  Dressing changed.  Site soft, R lateral thigh and buttock edema unchanged.  Doppler pulses remain intact.  ACT 190

## 2022-12-24 NOTE — Progress Notes (Signed)
Mobility Specialist - Progress Note   12/24/22 1105  Mobility  Activity Transferred to/from [facility]  Level of Assistance Minimal assist, patient does 75% or more  Assistive Device None  Activity Response Tolerated well  Mobility Referral Yes  $Mobility charge 1 Mobility   Pt was received in bed needing assistance to [facility]. No complaints throughout. Pt was left on BSC with call bell within reach.   Franki Monte  Mobility Specialist Please contact via Solicitor or Rehab office at [phone]

## 2022-12-24 NOTE — Progress Notes (Signed)
Hypoglycemic Event  CBG: 61  Treatment: D50 25 mL (12.5 gm)  Symptoms:  states "I feel weak."  Possible Reasons for Event: Inadequate meal intake Patient is NPO  Comments/MD notified: Dr. [place] Pink notified.  Unable to obtain repeat CBG on floor because patient is going down to cath lab for procedure; cath lab staff notified of hypoglycemia and will recheck.     [name]

## 2022-12-24 NOTE — Progress Notes (Signed)
Patient's left leg perfusion has been optimized. Will defer foot management to Dr. Loel Lofty, and will plan for outpatient follow-up. Pt, husband and daughter aware that she may require amputation in the future which could have poor healing due to her peripheral arterial disease and microvascular disease from longstanding diabetes.  Broadus John MD

## 2022-12-24 NOTE — Op Note (Signed)
Patient name: [name] MRN: [id number] DOB: [birth year] Sex: female  12/24/2022 Pre-operative Diagnosis: Left lower extremity critical limb ischemia with tissue loss Post-operative diagnosis:  Same Surgeon:  Broadus John, MD Procedure Performed: 1.  Ultrasound-guided micropuncture access of the right common femoral artery 2.  Aortogram 3.  Second-order cannulation, left lower extremity angiogram 4.  Drug-coated balloon angioplasty of the superficial femoral artery 5 x 40 mm 5.  Balloon angioplasty of the superficial femoral artery 4 x 40 mm 6.  Moderate sedation time 29 minutes 7.  Contrast volume 70 mL 8.  Manual pressure for manual pressure   Indications: Patient is an 82 year old female who presented to hospital with wounds on the left great toe.  ABIs demonstrated monophasic waveforms with nonpalpable pulses.  She had significant edema.  After discussing the risk and benefits of bilateral lower extremity angiogram with emphasis on the left foot, Verdis Frederickson elected to proceed.  Findings: Severe atherosclerotic disease throughout the lower extremities Aortogram: Bilateral renal arteries patent, no flow-limiting stenosis in the aortoiliac segments bilaterally.  On the left: Patent common femoral artery, patent profunda.  70% stenosis at the ostia of the superficial femoral artery with a 3 cm 70% stenotic lesion in the proximal superficial femoral artery.  Diffuse atherosclerotic disease throughout the superficial femoral artery.  The popliteal artery is widely patent, two-vessel, posterior tibial, peroneal runoff.  The posterior tibial artery continues into the foot via plantar arteries.  On the right: Patent common femoral artery, patent profunda.  60% ostial stenosis at the superficial femoral artery.  Diffuse atherosclerotic disease throughout the superficial femoral artery.  The popliteal artery is widely patent, two-vessel runoff posterior tibial, peroneal artery.  The posterior  tibial artery continues into the foot via the plantar arteries.   Procedure:  The patient was identified in the holding area and taken to room 8.  The patient was then placed supine on the table and prepped and draped in the usual sterile fashion.  A time out was called.  Ultrasound was used to evaluate the right common femoral artery.  It was patent .  A digital ultrasound image was acquired.  A micropuncture needle was used to access the right common femoral artery under ultrasound guidance.  An 018 wire was advanced without resistance and a micropuncture sheath was placed.  The 018 wire was removed and a benson wire was placed.  The micropuncture sheath was exchanged for a 5 french sheath.  An omniflush catheter was advanced over the wire to the level of L-1.  An abdominal angiogram was obtained.  Next, using the omniflush catheter and a benson wire, the aortic bifurcation was crossed and the catheter was placed into theleft external iliac artery and left runoff was obtained.  right runoff was performed via retrograde sheath injections.   I elected to attempt intervention on the left SFA ostial lesion, proximal SFA lesion.  A 6 x 45 cm sheath was brought to the field and parked in the left common femoral artery.  Next, a series of wires and catheters were used to traverse the superficial femoral artery.  Next, a 4 x 40 mm angioplasty balloon was brought to the field and placed across the ostium of the superficial femoral artery.  This was held for 3 minutes.  Following this, the balloon was used on the proximal superficial femoral artery.  Again, the balloon was inflated for 3 minutes.  Follow-up angiography demonstrated significant improvement in the proximal SFA.  There appeared  to be central stenosis at the ostia of the SFA.  A 5 x 40 drug-coated balloon was brought to the field and inflated for 3 minutes.  Follow-up angiography demonstrated excellent result with resolution of flow-limiting  stenosis.    Impression: Successful balloon angioplasty of ostial SFA lesion, proximal SFA lesion.  Two-vessel runoff to the level of the ankle with the posterior tibial artery tingling into the foot. Patient's left lower extremity blood flow has been optimized.  Right leg with nearly-symmetric disease, no intervention.  No wound on the side.  No plan for intervention at this time.    Cassandria Santee, MD [facility] Office: [phone]

## 2022-12-25 ENCOUNTER — Encounter (HOSPITAL_COMMUNITY): Payer: Self-pay | Admitting: Vascular Surgery

## 2022-12-25 DIAGNOSIS — I739 Peripheral vascular disease, unspecified: Secondary | ICD-10-CM | POA: Diagnosis not present

## 2022-12-25 LAB — GLUCOSE, CAPILLARY
Glucose-Capillary: 359 mg/dL — ABNORMAL HIGH (ref 70–99)
Glucose-Capillary: 111 mg/dL — ABNORMAL HIGH (ref 70–99)
Glucose-Capillary: 124 mg/dL — ABNORMAL HIGH (ref 70–99)
Glucose-Capillary: 144 mg/dL — ABNORMAL HIGH (ref 70–99)

## 2022-12-25 LAB — BASIC METABOLIC PANEL
Anion gap: 9 (ref 5–15)
BUN: 76 mg/dL — ABNORMAL HIGH (ref 8–23)
CO2: 31 mmol/L (ref 22–32)
Calcium: 6.6 mg/dL — ABNORMAL LOW (ref 8.9–10.3)
Chloride: 99 mmol/L (ref 98–111)
Creatinine, Ser: 2.43 mg/dL — ABNORMAL HIGH (ref 0.44–1.00)
GFR, Estimated: 20 mL/min — ABNORMAL LOW (ref >60)
Glucose, Bld: 295 mg/dL — ABNORMAL HIGH (ref 70–99)
Potassium: 4.1 mmol/L (ref 3.5–5.1)
Sodium: 139 mmol/L (ref 135–145)

## 2022-12-25 LAB — CBC
HCT: 23.8 % — ABNORMAL LOW (ref 36.0–46.0)
Hemoglobin: 7.2 g/dL — ABNORMAL LOW (ref 12.0–15.0)
MCH: 30.5 pg (ref 26.0–34.0)
MCHC: 30.3 g/dL (ref 30.0–36.0)
MCV: 100.8 fL — ABNORMAL HIGH (ref 80.0–100.0)
Platelets: 153 10*3/uL (ref 150–400)
RBC: 2.36 MIL/uL — ABNORMAL LOW (ref 3.87–5.11)
RDW: 15.5 % (ref 11.5–15.5)
WBC: 8 10*3/uL (ref 4.0–10.5)
nRBC: 0 % (ref 0.0–0.2)

## 2022-12-25 LAB — HEMOGLOBIN AND HEMATOCRIT, BLOOD
HCT: 25.9 % — ABNORMAL LOW (ref 36.0–46.0)
Hemoglobin: 8.2 g/dL — ABNORMAL LOW (ref 12.0–15.0)

## 2022-12-25 LAB — LIPID PANEL
Cholesterol: 76 mg/dL (ref 0–200)
HDL: 36 mg/dL — ABNORMAL LOW (ref >40)
LDL Cholesterol: 25 mg/dL (ref 0–99)
Total CHOL/HDL Ratio: 2.1 RATIO
Triglycerides: 77 mg/dL (ref <150)
VLDL: 15 mg/dL (ref 0–40)

## 2022-12-25 LAB — PREPARE RBC (CROSSMATCH)

## 2022-12-25 MED ORDER — CALCIUM ACETATE (PHOS BINDER) 667 MG PO CAPS: 1334.0000 mg | ORAL_CAPSULE | Freq: Three times a day (TID) | ORAL | 2 refills | Status: AC

## 2022-12-25 MED ORDER — OXYCODONE-ACETAMINOPHEN 5-325 MG PO TABS: 1.0000 | ORAL_TABLET | Freq: Four times a day (QID) | ORAL | 0 refills | Status: DC | PRN

## 2022-12-25 MED ORDER — VITAMIN D (ERGOCALCIFEROL) 1.25 MG (50000 UNIT) PO CAPS: 50000.0000 [IU] | ORAL_CAPSULE | ORAL | 0 refills | Status: DC

## 2022-12-25 MED ORDER — SODIUM CHLORIDE 0.9% IV SOLUTION: Freq: Once | INTRAVENOUS | Status: DC

## 2022-12-25 NOTE — Progress Notes (Signed)
Mobility Specialist - Progress Note   12/25/22 1051  Mobility  Activity Ambulated with assistance in room  Level of Assistance Minimal assist, patient does 75% or more  Assistive Device Front wheel walker  Distance Ambulated (ft) 20 ft  Activity Response Tolerated well  Mobility Referral Yes  $Mobility charge 1 Mobility   Pt was received on BSC and agreeable to mobility. Pt was MaxA for peri care. Pt had no complaints throughout session. Pt was left in chair with all needs met. RN/NT notified.  Franki Monte  Mobility Specialist Please contact via Solicitor or Rehab office at [phone]

## 2022-12-25 NOTE — Progress Notes (Signed)
PHARMACIST LIPID MONITORING   [name] is a 82 y.o. female admitted on 12/06/2022 with PVD.  Pharmacy has been consulted to optimize lipid-lowering therapy with the indication of secondary prevention for clinical ASCVD.  Recent Labs:  Lipid Panel (last 6 months):   Lab Results  Component Value Date   CHOL 76 12/25/2022   TRIG 77 12/25/2022   HDL 36 (L) 12/25/2022   CHOLHDL 2.1 12/25/2022   VLDL 15 12/25/2022   LDLCALC 25 12/25/2022    Hepatic function panel (last 6 months):   Lab Results  Component Value Date   AST 18 12/22/2022   ALT 11 12/22/2022   ALKPHOS 103 12/22/2022   BILITOT 0.6 12/22/2022   BILIDIR 0.2 10/16/2022   IBILI 0.4 10/16/2022    SCr (since admission):   Serum creatinine: 2.61 mg/dL (H) 12/24/22 0512 Estimated creatinine clearance: 15.4 mL/min (A)  Current therapy and lipid therapy tolerance Current lipid-lowering therapy: atorvastatin '40mg'$  Previous lipid-lowering therapies (if applicable): n/a Documented or reported allergies or intolerances to lipid-lowering therapies (if applicable): n/a  Assessment:   Patient is excluded from the protocol due to LDL 25 (ESRD, elevated LFTs, pregnancy/breastfeeding, active liver disease)  Plan:    1.Statin intensity (high intensity recommended for all patients regardless of the LDL):  No statin changes. The patient is already on a high intensity statin.  2.Add ezetimibe (if any one of the following):   Not indicated at this time.  3.Refer to lipid clinic:   No  4.Follow-up with:  Primary care provider - Isaac Bliss, Rayford Halsted, MD  5.Follow-up labs after discharge:  No changes in lipid therapy, repeat a lipid panel in one year.       Einar Grad, PharmD 12/25/2022, 7:23 AM

## 2022-12-25 NOTE — Care Management Important Message (Signed)
Important Message  Patient Details  Name: [name] MRN: [id number] Date of Birth: May 08, 1941   Medicare Important Message Given:  Yes     [name] 12/25/2022, 12:31 PM

## 2022-12-25 NOTE — Progress Notes (Signed)
Inpatient Diabetes Program Recommendations  AACE/ADA: New Consensus Statement on Inpatient Glycemic Control (2015)  Target Ranges:  Prepandial:   less than 140 mg/dL      Peak postprandial:   less than 180 mg/dL (1-2 hours)      Critically ill patients:  140 - 180 mg/dL   Lab Results  Component Value Date   GLUCAP 111 (H) 12/25/2022   HGBA1C 9.9 (A) 08/13/2022    Review of Glycemic Control  Latest Reference Range & Units 12/24/22 21:11 12/25/22 08:33 12/25/22 11:55  Glucose-Capillary 70 - 99 mg/dL 133 (H) 359 (H) 111 (H)  Diabetes history: DM 2 Outpatient Diabetes medications:  Toujeo 6 units q HS (per note patient is taking 10-16 units daily) Current orders for Inpatient glycemic control:  Novolog sensitive tid with meals  Inpatient Diabetes Program Recommendations:    Consider adding Semglee 6 units q HS.  Also consider reducing Novolog correction to very sensitive (0-6 units) tid with meals.    Thanks, Adah Perl, RN, BC-ADM Inpatient Diabetes Coordinator Pager [phone]  (8a-5p)

## 2022-12-25 NOTE — Discharge Instructions (Signed)
  [facility]  Discharge Instructions  Lower Extremity Angiogram; Angioplasty/Stenting  Please refer to the following instructions for your post-procedure care. Your surgeon or physician assistant will discuss any changes with you.  Activity  Avoid lifting more than 8 pounds (1 gallons of milk) for 5 days after your procedure. You may walk as much as you can tolerate. It's OK to drive after 72 hours.  Bathing/Showering  You may shower the day after your procedure. If you have a bandage, you may remove it at 24- 48 hours. Clean your incision site with mild soap and water. Pat the area dry with a clean towel.  Diet  Resume your pre-procedure diet. There are no special food restrictions following this procedure. All patients with peripheral vascular disease should follow a low fat/low cholesterol diet. In order to heal from your surgery, it is CRITICAL to get adequate nutrition. Your body requires vitamins, minerals, and protein. Vegetables are the best source of vitamins and minerals. Vegetables also provide the perfect balance of protein. Processed food has little nutritional value, so try to avoid this.  Medications  Resume taking all of your medications unless your doctor tells you not to. If your incision is causing pain, you may take over-the-counter pain relievers such as acetaminophen (Tylenol)  Follow Up  Follow up will be arranged at the time of your procedure. You may have an office visit scheduled or may be scheduled for surgery. Ask your surgeon if you have any questions.  Please call us immediately for any of the following conditions: .Severe or worsening pain your legs or feet at rest or with walking. .Increased pain, redness, drainage at your groin puncture site. .Fever of 101 degrees or higher. .If you have any mild or slow bleeding from your puncture site: lie down, apply firm constant pressure over the area with a piece of gauze or a  clean wash cloth for 30 minutes- no peeking!, call 911 right away if you are still bleeding after 30 minutes, or if the bleeding is heavy and unmanageable.  Reduce your risk factors of vascular disease:  . Stop smoking. If you would like help call [facility] at 1-800-QUIT-NOW (1-800-784-8669) or [place] at 336-586-4000. . Manage your cholesterol . Maintain a desired weight . Control your diabetes . Keep your blood pressure down .  If you have any questions, please call the office at [phone] 

## 2022-12-25 NOTE — Progress Notes (Signed)
Occupational Therapy Treatment Patient Details Name: [name] MRN: [id number] DOB: 11-Feb-1941 Today's Date: 12/25/2022   History of present illness 82 y.o. female admitted to [facility] on 12/06/2022 with suspected acute cystitis after presenting from home to [facility] ED complaining of generalized weakness and fall. PMH: AML, breast cancer, paroxysmal atrial fibrillation chronically anticoagulated on Eliquis, chronic diastolic heart failure, type 2 diabetes mellitus, CKD 4 associated baseline creatinine 1.8-3.0, anemia of chronic disease associated baseline hemoglobin 8-10,   OT comments  Pt in bed upon therapy arrival. Currently receiving blood transfusion although agreeable to participate in OT treatment session. Pt with increased fatigue and lethargy during session. Session was limited to seated EOB focusing on activity tolerance, bed mobility and sit to stand transitions. Pt was assisted back to bed to rest due to fatigue. Pt is hopeful that she will be going home tomorrow.    Recommendations for follow up therapy are one component of a multi-disciplinary discharge planning process, led by the attending physician.  Recommendations may be updated based on patient status, additional functional criteria and insurance authorization.    Follow Up Recommendations  Home health OT     Assistance Recommended at Discharge Set up Supervision/Assistance  Patient can return home with the following  A little help with walking and/or transfers;Assistance with cooking/housework;Assist for transportation;Help with stairs or ramp for entrance;Direct supervision/assist for medications management;Direct supervision/assist for financial management;A little help with bathing/dressing/bathroom   Equipment Recommendations  BSC/3in1       Precautions / Restrictions Precautions Precautions: Fall Restrictions Weight Bearing Restrictions: No LLE Weight Bearing: Weight bearing as tolerated        Mobility Bed Mobility Overal bed mobility: Needs Assistance Bed Mobility: Sit to Supine, Sidelying to Sit   Sidelying to sit: Supervision, HOB elevated   Sit to supine: Min assist   General bed mobility comments: min A for bringing LE back into bed Patient Response: Cooperative  Transfers Overall transfer level: Needs assistance   Transfers: Sit to/from Stand Sit to Stand: Supervision                     ADL either performed or assessed with clinical judgement      Cognition Arousal/Alertness: Lethargic Behavior During Therapy: Flat affect Overall Cognitive Status: Within Functional Limits for tasks assessed         General Comments: pt extremely fatigued during session with eyes remaining closed for majority of session.                   Pertinent Vitals/ Pain       Pain Assessment Pain Assessment: Faces Faces Pain Scale: No hurt         Frequency  Min 2X/week        Progress Toward Goals  OT Goals(current goals can now be found in the care plan section)  Progress towards OT goals: Progressing toward goals     Plan Discharge plan remains appropriate;Frequency remains appropriate       AM-PAC OT "6 Clicks" Daily Activity     Outcome Measure   Help from another person eating meals?: None Help from another person taking care of personal grooming?: A Little Help from another person toileting, which includes using toliet, bedpan, or urinal?: A Lot Help from another person bathing (including washing, rinsing, drying)?: A Lot Help from another person to put on and taking off regular upper body clothing?: A Little Help from another person to put on and  taking off regular lower body clothing?: A Lot 6 Click Score: 16    End of Session Equipment Utilized During Treatment: Oxygen  OT Visit Diagnosis: Unsteadiness on feet (R26.81);Other abnormalities of gait and mobility (R26.89);Muscle weakness (generalized) (M62.81);History of falling  (Z91.81)      Patient Left in bed;with call bell/phone within reach;with bed alarm set           Time: 1531-1541 OT Time Calculation (min): 10 min  Charges: OT General Charges $OT Visit: 1 Visit OT Treatments $Therapeutic Activity: 8-22 mins  Ailene Ravel, OTR/L,CBIS  Supplemental OT - [facility] and [facility] Secure Chat Preferred    Kaydence Baba, Clarene Duke 12/25/2022, 4:31 PM

## 2022-12-25 NOTE — TOC Progression Note (Signed)
Transition of Care [facility]) - Progression Note    Patient Details  Name: [name] MRN: [id number] Date of Birth: 1941/08/13  Transition of [facility]) CM/SW Contact  Graves-Bigelow, Ocie Cornfield, RN Phone Number: 12/25/2022, 4:12 PM  Clinical Narrative: Case Manager received a call from the Workers Comp Case Manager  [name] and she states that the patients oxygen contract has ended and will be picked up soon. Message sent to the provider; if patient needs oxygen going out patient will need qualifying sats and CM will have to arrange with an agency prior to d/c home. Workers comp will allow the patient to keep the wheelchair in the home. Case Manager will follow up in the morning.   Expected Discharge Plan: [place] Barriers to Discharge: Continued Medical Work up  Expected Discharge Plan and Services In-house Referral: NA Discharge Planning Services: CM Consult Post Acute Care Choice: [place] arrangements for the past 2 months: Single Family Home                 DME Arranged: Bedside commode DME Agency: [employer] Date DME Agency Contacted: 12/09/22 Time DME Agency Contacted: [phone] Representative spoke with at DME Agency: [facility] HH Arranged: RN, Disease Management, PT, OT, Nurse's Aide [place] Agency: [place] Date [place]: 12/09/22 Time [place]: 1636 Representative spoke with at [place]: [place]) Interventions [place]: No Food Insecurity (12/08/2022)  Housing: Low Risk  (12/08/2022)  Transportation Needs: No Transportation Needs (12/08/2022)  Utilities: Not At Risk (12/08/2022)  Alcohol Screen: Low Risk  (10/22/2022)  Depression (PHQ2-9): Low Risk  (10/03/2022)  Financial Resource Strain: Low Risk  (10/22/2022)  Stress: No Stress Concern Present (05/12/2022)  Tobacco Use: Medium Risk (12/25/2022)    Readmission Risk Interventions    12/09/2022     4:35 PM  Readmission Risk Prevention Plan  Transportation Screening Complete  Medication Review (RN Care Manager) Complete  HRI or [place] Complete  SW Recovery Care/Counseling Consult Complete  [place] Not Applicable

## 2022-12-25 NOTE — Progress Notes (Signed)
  Progress Note    12/25/2022 7:49 AM 1 Day Post-Op  Subjective:  no complaints   Vitals:   12/24/22 2111 12/25/22 0547  BP: (!) 120/49 (!) 125/56  Pulse: 94 90  Resp:  20  Temp: 97.6 F (36.4 C) 97.8 F (36.6 C)  SpO2: 93% 100%   Physical Exam: Cardiac:  regular Lungs:  non labored Extremities:  R common femoral access site c/d/I without swelling or hematoma. BLE well perfused and warm with doppler PT/ peroneal signals Abdomen:  soft Neurologic: alert and oriented  CBC    Component Value Date/Time   WBC 8.0 12/25/2022 0610   RBC 2.36 (L) 12/25/2022 0610   HGB 7.2 (L) 12/25/2022 0610   HGB 9.1 (L) 11/10/2022 1412   HGB 11.2 10/06/2017 1632   HGB 9.6 (L) 09/04/2017 1344   HCT 23.8 (L) 12/25/2022 0610   HCT 35.6 10/06/2017 1632   HCT 29.3 (L) 09/04/2017 1344   PLT 153 12/25/2022 0610   PLT 181 11/10/2022 1412   PLT 213 10/06/2017 1632   MCV 100.8 (H) 12/25/2022 0610   MCV 93 10/06/2017 1632   MCV 95.6 09/04/2017 1344   MCH 30.5 12/25/2022 0610   MCHC 30.3 12/25/2022 0610   RDW 15.5 12/25/2022 0610   RDW 15.1 10/06/2017 1632   RDW 14.7 (H) 09/04/2017 1344   LYMPHSABS 1.2 12/22/2022 0310   LYMPHSABS 1.4 09/04/2017 1344   MONOABS 0.7 12/22/2022 0310   MONOABS 0.6 09/04/2017 1344   EOSABS 0.2 12/22/2022 0310   EOSABS 0.1 09/04/2017 1344   BASOSABS 0.0 12/22/2022 0310   BASOSABS 0.0 09/04/2017 1344    BMET    Component Value Date/Time   NA 139 12/25/2022 0610   NA 147 (H) 05/19/2022 1605   NA 144 09/04/2017 1436   K 4.1 12/25/2022 0610   K 4.1 09/04/2017 1436   CL 99 12/25/2022 0610   CO2 31 12/25/2022 0610   CO2 26 09/04/2017 1436   GLUCOSE 295 (H) 12/25/2022 0610   GLUCOSE 152 (H) 09/04/2017 1436   BUN 76 (H) 12/25/2022 0610   BUN 26 05/19/2022 1605   BUN 20.6 09/04/2017 1436   CREATININE 2.43 (H) 12/25/2022 0610   CREATININE 1.53 (H) 01/22/2022 1457   CREATININE 1.4 (H) 09/04/2017 1436   CALCIUM 6.6 (L) 12/25/2022 0610   CALCIUM 9.4  09/04/2017 1436   GFRNONAA 20 (L) 12/25/2022 0610   GFRNONAA 34 (L) 01/22/2022 1457   GFRAA 39 (L) 10/04/2020 1404   GFRAA 39 (L) 03/29/2019 1459    INR    Component Value Date/Time   INR 1.1 12/08/2021 1251   INR 1.10 (L) 02/29/2016 1057     Intake/Output Summary (Last 24 hours) at 12/25/2022 0749 Last data filed at 12/25/2022 0550 Gross per 24 hour  Intake 990.58 ml  Output 750 ml  Net 240.58 ml     Assessment/Plan:  82 y.o. female is s/p Aortogram, arteriogram LLE with balloon and DCB angioplasty of left SFA and  1 Day Post-Op   LLE well perfused and warm with Doppler PT/Pero signals bilaterally Right common femoral access site c/d/I without swelling or hematoma Okay to restart Eliquis Wound care Per Dr. Loel Lofty Continue statin, Plavix, Eliquis Will arrange outpatient follow up in 1 month with LLE arterial duplex and ABIs   Karoline Caldwell, PA-C [facility] [phone] 12/25/2022 7:49 AM

## 2022-12-25 NOTE — Discharge Summary (Signed)
[facility]  Physician Discharge Summary   Patient ID: [name] MRN: [id number] DOB/AGE: [birth year] 82 y.o.  Admit date: 12/06/2022 Discharge date:   12/25/2022   PCP: Isaac Bliss, Rayford Halsted, MD  DISCHARGE DIAGNOSES:    Acute cystitis   Paroxysmal atrial fibrillation ([place])   Hyperlipidemia   Anemia of chronic disease   CKD (chronic kidney disease) stage 4, GFR 15-29 ml/min (HCC)   DM2 (diabetes mellitus, type 2) (HCC)   Acute on chronic diastolic CHF (congestive heart failure) (HCC)   Generalized weakness   Hypocalcemia   History of seizures Peripheral artery disease   RECOMMENDATIONS FOR OUTPATIENT FOLLOW UP: Vascular surgery to arrange outpatient follow-up   Home Health: PT OT aide Equipment/Devices: None  CODE STATUS: DNR  DISCHARGE CONDITION: fair  Diet recommendation: Heart healthy  INITIAL HISTORY: 82 y.o. female with a history of AML, breast cancer, paroxysmal atrial fibrillation on Eliquis, chronic diastolic heart failure, diabetes mellitus type 2, CKD stage IV. Patient presented secondary to generalized weakness and found to have evidence of UTI, hypocalcemia and acute heart failure. Patient started on calcium replacement, empiric Ceftriaxone and given Lasix. Associated hyperphosphatemia.    With supplementation of calcium and vitamin D, levels have improved. UTI treated. Renal function remains poor but stable and tolerated diuresis with nephrology assistance. Plan now is for arteriogram with CO2 on 1/17.     HOSPITAL COURSE:   Peripheral artery disease/limb ischemia/left foot pain: Has wound on great toe after dropping a jar of pennies on it.  There was no evidence for infection.  No fractures noted on x-rays. Vascular surgery was consulted.  Patient underwent arteriogram on 1/17.  Underwent balloon angioplasty.  Symptoms have improved.  Continue with apixaban and Plavix.   On statin.   Acute cystitis Urine culture significant for  Klebsiella pneumoniae, resistant to ampicillin and nitrofurantoin.   Completed ceftriaxone, symptoms resolved.   Generalized weakness Noted on admission. Possibly related to acute illness however, possibly related to hypocalcemia.  - PT/OT recommend home health on discharge.   Acute on chronic diastolic heart failure Patient was diuresed.  Weight has decreased.  Noted to be on once daily furosemide.   Hypocalcemia Patient with associated hyperphosphatemia. Likely related to renal impairment.  - Remains low, though overall improved and not severe nor symptomatic. - Continue calcium acetate w/hyperphosphatemia.  - Vitamin D deficiency, level is < 20, supplement 50k units weekly started - PTH 307 consistent with hyperparathyroidism/MBD secondary to vitamin D deficiency and renal failure.   AKI on CKD stage IV Baseline creatinine of about 2 with more recent creatinine of 4.33. Creatinine of 3.03 on admission and stable-to-slightly-improved since that time.  Patient was somewhat fluid overloaded.  Currently on once daily furosemide.  Seen by nephrology during this hospital stay.  Renal function stable for the most part.     Anemia of CKD s/p 1u PRBC 1/6. Hemoglobin has drifted down slightly and noted to be 7.2 today.  Considering her vascular disease and heart disease she will be transfused 1 unit of PRBC today prior to discharge.     Hyperphosphatemia Likely secondary to kidney disease. Discussed with nephrology who recommended transition to calcium acetate.    Hypomagnesemia - Improved.    Metabolic acidosis Due to renal impairment Bicarbonate levels have improved.  Now off of bicarbonate tablets.   Hyperlipidemia - Continue atorvastatin   T2DM Chronically uncontrolled with hyperglycemia (HbA1c 9.9%)    Paroxysmal atrial fibrillation Noted to be on metoprolol.  Continue  apixaban.   History of seizures - Continue Keppra   AML In remission. Patient follows with Dr. [name]  as an outpatient.   CAD Patient with a history of NSTEMI, treated medically.  - Continue plavix, atorvastatin  Patient is stable.  Okay for discharge after transfusion today.   PERTINENT LABS:  The results of significant diagnostics from this hospitalization (including imaging, microbiology, ancillary and laboratory) are listed below for reference.    Labs:   Basic Metabolic Panel: Recent Labs  Lab 12/19/22 0303 12/20/22 6073 12/21/22 0242 12/22/22 0310 12/24/22 0512 12/25/22 0610  NA 139 140 142 141 141 139  K 3.6 3.4* 3.8 4.3 4.4 4.1  CL 96* 95* 97* 97* 100 99  CO2 33* 32 33* 33* 32 31  GLUCOSE 172* 197* 256* 175* 309* 295*  BUN 67* 72* 72* 71* 76* 76*  CREATININE 2.53* 2.39* 2.24* 2.25* 2.61* 2.43*  CALCIUM 7.2* 7.1* 7.2* 7.2* 7.0* 6.6*  MG 1.8 1.9 1.9 1.9  --   --   PHOS 3.3 3.4 3.2 3.5  --   --    Liver Function Tests: Recent Labs  Lab 12/19/22 0303 12/20/22 0639 12/21/22 0242 12/22/22 0310  AST 17  --  18 18  ALT 11  --  10 11  ALKPHOS 108  --  99 103  BILITOT 0.6  --  0.5 0.6  PROT 5.4*  --  5.1* 5.1*  ALBUMIN 2.5* 2.4* 2.4* 2.4*    CBC: Recent Labs  Lab 12/19/22 0303 12/20/22 0639 12/21/22 0242 12/22/22 0310 12/24/22 0512 12/25/22 0610  WBC 8.9 9.6 8.3 8.7 7.1 8.0  NEUTROABS 6.9 8.0* 6.6 6.6  --   --   HGB 8.8* 8.3* 8.0* 7.5* 7.6* 7.2*  HCT 27.4* 25.9* 25.4* 24.8* 24.6* 23.8*  MCV 95.8 96.3 96.9 100.4* 100.0 100.8*  PLT 157 149* 157 154 161 153    CBG: Recent Labs  Lab 12/24/22 1141 12/24/22 1400 12/24/22 1637 12/24/22 2111 12/25/22 0833  GLUCAP 61* 71 117* 133* 359*     IMAGING STUDIES PERIPHERAL VASCULAR CATHETERIZATION  Result Date: 12/24/2022 Images from the original result were not included. Patient name: [name] MRN: [id number] DOB: 1941/05/30 Sex: female 12/24/2022 Pre-operative Diagnosis: Left lower extremity critical limb ischemia with tissue loss Post-operative diagnosis:  Same Surgeon:  Broadus John, MD  Procedure Performed: 1.  Ultrasound-guided micropuncture access of the right common femoral artery 2.  Aortogram 3.  Second-order cannulation, left lower extremity angiogram 4.  Drug-coated balloon angioplasty of the superficial femoral artery 5 x 40 mm 5.  Balloon angioplasty of the superficial femoral artery 4 x 40 mm 6.  Moderate sedation time 29 minutes 7.  Contrast volume 70 mL 8.  Manual pressure for manual pressure Indications: Patient is an 82 year old female who presented to hospital with wounds on the left great toe.  ABIs demonstrated monophasic waveforms with nonpalpable pulses.  She had significant edema.  After discussing the risk and benefits of bilateral lower extremity angiogram with emphasis on the left foot, [name] elected to proceed. Findings: Severe atherosclerotic disease throughout the lower extremities Aortogram: Bilateral renal arteries patent, no flow-limiting stenosis in the aortoiliac segments bilaterally. On the left: Patent common femoral artery, patent profunda.  70% stenosis at the ostia of the superficial femoral artery with a 3 cm 70% stenotic lesion in the proximal superficial femoral artery.  Diffuse atherosclerotic disease throughout the superficial femoral artery.  The popliteal artery is widely patent, two-vessel, posterior tibial, peroneal runoff.  The posterior tibial artery continues into the foot via plantar arteries. On the right: Patent common femoral artery, patent profunda.  60% ostial stenosis at the superficial femoral artery.  Diffuse atherosclerotic disease throughout the superficial femoral artery.  The popliteal artery is widely patent, two-vessel runoff posterior tibial, peroneal artery.  The posterior tibial artery continues into the foot via the plantar arteries.  Procedure:  The patient was identified in the holding area and taken to room 8.  The patient was then placed supine on the table and prepped and draped in the usual sterile fashion.  A time out was  called.  Ultrasound was used to evaluate the right common femoral artery.  It was patent .  A digital ultrasound image was acquired.  A micropuncture needle was used to access the right common femoral artery under ultrasound guidance.  An 018 wire was advanced without resistance and a micropuncture sheath was placed.  The 018 wire was removed and a benson wire was placed.  The micropuncture sheath was exchanged for a 5 french sheath.  An omniflush catheter was advanced over the wire to the level of L-1.  An abdominal angiogram was obtained.  Next, using the omniflush catheter and a benson wire, the aortic bifurcation was crossed and the catheter was placed into theleft external iliac artery and left runoff was obtained.  right runoff was performed via retrograde sheath injections. I elected to attempt intervention on the left SFA ostial lesion, proximal SFA lesion.  A 6 x 45 cm sheath was brought to the field and parked in the left common femoral artery.  Next, a series of wires and catheters were used to traverse the superficial femoral artery.  Next, a 4 x 40 mm angioplasty balloon was brought to the field and placed across the ostium of the superficial femoral artery.  This was held for 3 minutes.  Following this, the balloon was used on the proximal superficial femoral artery.  Again, the balloon was inflated for 3 minutes. Follow-up angiography demonstrated significant improvement in the proximal SFA.  There appeared to be central stenosis at the ostia of the SFA.  A 5 x 40 drug-coated balloon was brought to the field and inflated for 3 minutes.  Follow-up angiography demonstrated excellent result with resolution of flow-limiting stenosis. Impression: Successful balloon angioplasty of ostial SFA lesion, proximal SFA lesion.  Two-vessel runoff to the level of the ankle with the posterior tibial artery tingling into the foot. Patient's left lower extremity blood flow has been optimized. Right leg with  nearly-symmetric disease, no intervention.  No wound on the side.  No plan for intervention at this time. Cassandria Santee, MD [facility] Office: [phone]   DG CHEST PORT 1 VIEW  Result Date: 12/24/2022 CLINICAL DATA:  Dyspnea, shortness of breath this morning EXAM: PORTABLE CHEST 1 VIEW COMPARISON:  Portable exam 1124 hours compared to 12/10/2022 FINDINGS: RIGHT jugular Port-A-Cath with tip projecting over SVC. Stable heart size and mediastinal contours. Slight pulmonary vascular congestion. Atherosclerotic calcification aorta. Persistent RIGHT pleural effusion and basilar atelectasis. Upper lungs clear. No pneumothorax or acute osseous findings. IMPRESSION: Persistent RIGHT pleural effusion and basilar atelectasis. Aortic Atherosclerosis (ICD10-I70.0). Electronically Signed   By: Lavonia Dana M.D.   On: 12/24/2022 12:05   VAS [place] ABI WITH/WO TBI  Result Date: 12/11/2022  LOWER EXTREMITY DOPPLER STUDY Patient Name:  [name]  Date of Exam:   12/11/2022 Medical Rec #: [id number]  Accession #:    [id number] Date of Birth: 07/11/1941      Patient Gender: F Patient Age:   66 years Exam Location:  [facility] Procedure:      VAS [place] ABI WITH/WO TBI Referring Phys: Vance Gather --------------------------------------------------------------------------------  Indications: Rest pain, and ulceration. High Risk Factors: Hypertension, hyperlipidemia, Diabetes.  Comparison Study: No prior studies. Performing Technologist: Carlos Levering RVT  Examination Guidelines: A complete evaluation includes at minimum, Doppler waveform signals and systolic blood pressure reading at the level of bilateral brachial, anterior tibial, and posterior tibial arteries, when vessel segments are accessible. Bilateral testing is considered an integral part of a complete examination. Photoelectric Plethysmograph (PPG) waveforms and toe systolic pressure readings are included as required and additional  duplex testing as needed. Limited examinations for reoccurring indications may be performed as noted.  ABI Findings: +---------+------------------+-----+----------+--------+ Right    Rt Pressure (mmHg)IndexWaveform  Comment  +---------+------------------+-----+----------+--------+ Brachial 152                    triphasic          +---------+------------------+-----+----------+--------+ PTA      253               1.66 monophasic         +---------+------------------+-----+----------+--------+ DP       254               1.67 monophasic         +---------+------------------+-----+----------+--------+ Great Toe39                0.26                    +---------+------------------+-----+----------+--------+ +--------+------------------+-----+-----------+-------+ Left    Lt Pressure (mmHg)IndexWaveform   Comment +--------+------------------+-----+-----------+-------+ [id number]                    triphasic          +--------+------------------+-----+-----------+-------+ PTA     145               0.95 multiphasic        +--------+------------------+-----+-----------+-------+ DP      91                0.60 monophasic         +--------+------------------+-----+-----------+-------+ +-------+-----------+-----------+------------+------------+ ABI/TBIToday's ABIToday's TBIPrevious ABIPrevious TBI +-------+-----------+-----------+------------+------------+ Right  [place]         0.26                                +-------+-----------+-----------+------------+------------+ Left   0.95                                           +-------+-----------+-----------+------------+------------+  Summary: Right: Resting right ankle-brachial index indicates noncompressible right lower extremity arteries. The right toe-brachial index is abnormal. Left: Resting left ankle-brachial index is within normal range. Unable to obtain TBI due to great toe wound, and absent  waveforms. *See table(s) above for measurements and observations.  Electronically signed by Monica Martinez MD on 12/11/2022 at 3:27:26 PM.    Final    DG CHEST PORT 1 VIEW  Result Date: 12/10/2022 CLINICAL DATA:  Dyspnea EXAM: PORTABLE CHEST 1 VIEW COMPARISON:  12/06/2022 FINDINGS: Cardiac shadow is enlarged but stable. Aortic calcifications are noted. Right chest wall  port is seen. Increased vascular congestion is noted with small right effusion. Patchy airspace opacity in the right base is noted as well. No bony abnormality is seen. IMPRESSION: Mild vascular congestion. Right effusion and associated airspace opacity in the right base. Electronically Signed   By: Inez Catalina M.D.   On: 12/10/2022 22:37   DG Knee 1-2 Views Left  Result Date: 12/10/2022 CLINICAL DATA:  Fall a couple weeks ago.  Lower leg pain. EXAM: LEFT KNEE - 1-2 VIEW COMPARISON:  Right femur and right tibia and fibula radiographs 08/29/2012 FINDINGS: There is diffuse decreased bone mineralization. Minimal medial compartment joint space narrowing. Moderate to severe patellofemoral joint space narrowing. Mild-to-moderate chronic enthesopathic changes at the quadriceps and patellar tendon insertions on the patella. No significant knee joint effusion is seen. No definite acute fracture is seen. No dislocation. Moderate to high-grade vascular calcifications. IMPRESSION: 1. Moderate patellofemoral osteoarthritis. 2. No acute fracture is seen. Electronically Signed   By: Yvonne Kendall M.D.   On: 12/10/2022 13:05   DG Foot Complete Left  Result Date: 12/10/2022 CLINICAL DATA:  [place] [employer] go.  Left foot and toe pain. EXAM: LEFT FOOT - COMPLETE 3+ VIEW COMPARISON:  None available FINDINGS: There is diffuse decreased bone mineralization. Moderate to large plantar calcaneal heel spur. Mild second and third tarsometatarsal joint space narrowing. No definite acute fracture is seen. No dislocation. Mild hallux valgus. IMPRESSION: 1. Moderate to  large plantar calcaneal heel spur. 2. Mild hallux valgus. 3. Within the limitations of diffuse decreased bone mineralization, no definite acute fracture is seen. Electronically Signed   By: Yvonne Kendall M.D.   On: 12/10/2022 13:03   DG HIP UNILAT WITH PELVIS 2-3 VIEWS LEFT  Result Date: 12/10/2022 CLINICAL DATA:  Fall a couple weeks ago. Follow-up. Pain in left hip and left lower leg. EXAM: DG HIP (WITH OR WITHOUT PELVIS) 2-3V LEFT COMPARISON:  AP pelvis 12/06/2022; CT right hip 08/29/2022 FINDINGS: There is again diffuse decreased bone mineralization. Partial visualization of cephalomedullary nail fixating the right intertrochanteric fracture seen on 08/29/2022 CT. Moderate bilateral femoroacetabular joint space narrowing. No definite acute fracture is seen within the left hip. Vascular phleboliths overlie the pelvis. Moderate to high-grade atherosclerotic calcifications. IMPRESSION: No definite acute fracture. Electronically Signed   By: Yvonne Kendall M.D.   On: 12/10/2022 12:59   US RENAL  Result Date: 12/09/2022 CLINICAL DATA:  Acute kidney injury EXAM: RENAL / URINARY TRACT ULTRASOUND COMPLETE COMPARISON:  Ultrasound 08/31/2022 FINDINGS: Right Kidney: Renal measurements: 9.8 x 5.5 x 5.1 cm = volume: 144 mL. Echogenicity within normal limits. No hydronephrosis. Cyst at the lower pole measuring 12 mm, no imaging follow-up is recommended. Left Kidney: Renal measurements: 10.3 x 5.3 x 5.4 cm = volume: 153.3 mL. Echogenicity within normal limits. No hydronephrosis. Small cyst at the midpole measuring 10 mm. Bladder: Appears normal for degree of bladder distention. Other: None. IMPRESSION: Negative for hydronephrosis. Small renal cysts for which no additional imaging follow-up is recommended Electronically Signed   By: Donavan Foil M.D.   On: 12/09/2022 20:47   DG Lumbar Spine 2-3 Views  Result Date: 12/08/2022 CLINICAL DATA:  Back pain EXAM: LUMBAR SPINE - 2-3 VIEW COMPARISON:  Previous studies including  radiograph done on 05/10/2013 and CT done on 12/06/2022 FINDINGS: No recent fracture is seen. Degenerative changes are noted with bony spurs, disc space narrowing and facet hypertrophy at multiple levels in lumbar spine and lower thoracic spine. There is interval progression of degenerative changes. There  is slightly inhomogeneous attenuation in the vertebral bodies. Mottled appearance in bone marrow seen in the CT is difficult to demonstrate in the radiographs. As far as seen, there is no break in the cortical margins. Endplate sclerosis is seen at multiple levels, most likely due to degenerative arthritis. IMPRESSION: No recent fracture is seen. Lumbar spondylosis with interval progression. Alignment of posterior margins of vertebral bodies appears normal. Electronically Signed   By: Elmer Picker M.D.   On: 12/08/2022 20:31   CT Lumbar Spine Wo Contrast  Result Date: 12/06/2022 CLINICAL DATA:  Fall EXAM: CT LUMBAR SPINE WITHOUT CONTRAST TECHNIQUE: Multidetector CT imaging of the lumbar spine was performed without intravenous contrast administration. Multiplanar CT image reconstructions were also generated. RADIATION DOSE REDUCTION: This exam was performed according to the departmental dose-optimization program which includes automated exposure control, adjustment of the mA and/or kV according to patient size and/or use of iterative reconstruction technique. COMPARISON:  Lumbar spine MRI 08/08/2017 CT abdomen pelvis 04/17/2022 FINDINGS: Segmentation: 5 lumbar type vertebrae. Alignment: Normal Vertebrae: Diffusely abnormal bone marrow with more to multiple Schmorl's nodes, greatest at the superior endplate of L2 and L3. no acute fracture. Paraspinal and other soft tissues: Bilateral pleural effusions, right greater than left. Calcific aortic atherosclerosis. Peripherally calcified low-density pancreatic head mass measures 3.2 cm, decreased in size compared to CT abdomen pelvis 04/17/2022. Disc levels: No  spinal canal stenosis. Severe left L5 neural foraminal stenosis. IMPRESSION: 1. Diffusely abnormal bone marrow, unchanged. This may be a sequela of leukemia treatment. 2. No acute fracture. 3. No spinal canal stenosis. Severe left L5 neural foraminal stenosis. 4. Bilateral pleural effusions, right greater than left. 5. Peripherally calcified low-density pancreatic head lesion measures 3.2 cm, decreased in size compared to CT abdomen pelvis 04/17/2022. Aortic Atherosclerosis (ICD10-I70.0). Electronically Signed   By: Ulyses Jarred M.D.   On: 12/06/2022 23:11   CT Head Wo Contrast  Result Date: 12/06/2022 CLINICAL DATA:  Status post fall. EXAM: CT HEAD WITHOUT CONTRAST CT CERVICAL SPINE WITHOUT CONTRAST TECHNIQUE: Multidetector CT imaging of the head and cervical spine was performed following the standard protocol without intravenous contrast. Multiplanar CT image reconstructions of the cervical spine were also generated. RADIATION DOSE REDUCTION: This exam was performed according to the departmental dose-optimization program which includes automated exposure control, adjustment of the mA and/or kV according to patient size and/or use of iterative reconstruction technique. COMPARISON:  August 29, 2022 FINDINGS: CT HEAD FINDINGS Brain: There is mild cerebral atrophy with widening of the extra-axial spaces and ventricular dilatation. There are areas of decreased attenuation within the white matter tracts of the supratentorial brain, consistent with microvascular disease changes. A stable 1.6 cm x 1.5 cm x 1.6 cm focal calcification is seen along the anterior aspect of the interhemispheric fissure. Vascular: No hyperdense vessels are identified. Skull: Normal. Negative for fracture or focal lesion. Sinuses/Orbits: No acute finding. Other: None. CT CERVICAL SPINE FINDINGS Alignment: Normal. Skull base and vertebrae: No acute fracture. Ill-defined lytic and sclerotic areas are seen scattered throughout the  visualized osseous structures. Soft tissues and spinal canal: No prevertebral fluid or swelling. No visible canal hematoma. Disc levels: Mild anterior osteophyte formation is seen at the levels of C3-C4, C4-C5, C5-C6 and C6-C7. There is moderate severity narrowing of the anterior atlantoaxial articulation. Mild multilevel intervertebral disc space narrowing is seen. Bilateral moderate severity multilevel facet joint hypertrophy is noted. Upper chest: Posterior right apical airspace disease is noted with an adjacent pleural effusion. Other: N/A IMPRESSION: 1. No  acute intracranial abnormality. 2. Findings suggestive of a stable calcified meningioma along the anterior aspect of the interhemispheric fissure. 3. No acute fracture or subluxation of the cervical spine. 4. Ill-defined lytic and sclerotic areas scattered throughout the visualized osseous structures, concerning for osseous metastatic disease. 5. Posterior right apical airspace disease with an adjacent pleural effusion. Electronically Signed   By: Virgina Norfolk M.D.   On: 12/06/2022 22:31   CT Cervical Spine Wo Contrast  Result Date: 12/06/2022 CLINICAL DATA:  Status post fall. EXAM: CT HEAD WITHOUT CONTRAST CT CERVICAL SPINE WITHOUT CONTRAST TECHNIQUE: Multidetector CT imaging of the head and cervical spine was performed following the standard protocol without intravenous contrast. Multiplanar CT image reconstructions of the cervical spine were also generated. RADIATION DOSE REDUCTION: This exam was performed according to the departmental dose-optimization program which includes automated exposure control, adjustment of the mA and/or kV according to patient size and/or use of iterative reconstruction technique. COMPARISON:  August 29, 2022 FINDINGS: CT HEAD FINDINGS Brain: There is mild cerebral atrophy with widening of the extra-axial spaces and ventricular dilatation. There are areas of decreased attenuation within the white matter tracts of  the supratentorial brain, consistent with microvascular disease changes. A stable 1.6 cm x 1.5 cm x 1.6 cm focal calcification is seen along the anterior aspect of the interhemispheric fissure. Vascular: No hyperdense vessels are identified. Skull: Normal. Negative for fracture or focal lesion. Sinuses/Orbits: No acute finding. Other: None. CT CERVICAL SPINE FINDINGS Alignment: Normal. Skull base and vertebrae: No acute fracture. Ill-defined lytic and sclerotic areas are seen scattered throughout the visualized osseous structures. Soft tissues and spinal canal: No prevertebral fluid or swelling. No visible canal hematoma. Disc levels: Mild anterior osteophyte formation is seen at the levels of C3-C4, C4-C5, C5-C6 and C6-C7. There is moderate severity narrowing of the anterior atlantoaxial articulation. Mild multilevel intervertebral disc space narrowing is seen. Bilateral moderate severity multilevel facet joint hypertrophy is noted. Upper chest: Posterior right apical airspace disease is noted with an adjacent pleural effusion. Other: N/A IMPRESSION: 1. No acute intracranial abnormality. 2. Findings suggestive of a stable calcified meningioma along the anterior aspect of the interhemispheric fissure. 3. No acute fracture or subluxation of the cervical spine. 4. Ill-defined lytic and sclerotic areas scattered throughout the visualized osseous structures, concerning for osseous metastatic disease. 5. Posterior right apical airspace disease with an adjacent pleural effusion. Electronically Signed   By: Virgina Norfolk M.D.   On: 12/06/2022 22:31   DG Pelvis Portable  Result Date: 12/06/2022 CLINICAL DATA:  Trauma, fall EXAM: PORTABLE PELVIS 1-2 VIEWS COMPARISON:  None Available. FINDINGS: There is previous internal fixation right femur with intramedullary rod. No definite recent displaced fracture is seen. There is joint space narrowing in both hips. Arterial calcifications are seen in soft tissues.  Degenerative changes are noted in the visualized lower lumbar spine. IMPRESSION: No recent displaced fracture is seen. Previous internal fixation in right femur. Lumbar spondylosis.  Arteriosclerosis. Electronically Signed   By: Elmer Picker M.D.   On: 12/06/2022 21:58   DG Chest Portable 1 View  Result Date: 12/06/2022 CLINICAL DATA:  Recent fall with weakness, initial encounter EXAM: PORTABLE CHEST 1 VIEW COMPARISON:  11/06/2022 FINDINGS: Cardiac shadow is enlarged but stable. Aortic calcifications are again seen. Chest wall port is again noted on the right and stable. The lungs are well aerated bilaterally. No focal infiltrate is seen. Chronic scarring is noted in the left medial lung base. No acute bony abnormality is noted. IMPRESSION:  No acute abnormality seen. Electronically Signed   By: Inez Catalina M.D.   On: 12/06/2022 21:57    DISCHARGE EXAMINATION: Vitals:   12/24/22 1755 12/24/22 1818 12/24/22 2111 12/25/22 0547  BP: (!) 122/37 (!) 123/55 (!) 120/49 (!) 125/56  Pulse: 86 83 94 90  Resp: (!) '26 20  20  '$ Temp:  97.9 F (36.6 C) 97.6 F (36.4 C) 97.8 F (36.6 C)  TempSrc:  Oral Oral Oral  SpO2: 91% 92% 93% 100%  Weight:    69 kg  Height:       General appearance: Awake alert.  In no distress Resp: Clear to auscultation bilaterally.  Normal effort Cardio: S1-S2 is normal regular.  No S3-S4.  No rubs murmurs or bruit GI: Abdomen is soft.  Nontender nondistended.  Bowel sounds are present normal.  No masses organomegaly   DISPOSITION: Home  Discharge Instructions     Call MD for:  difficulty breathing, headache or visual disturbances   Complete by: As directed    Call MD for:  extreme fatigue   Complete by: As directed    Call MD for:  persistant dizziness or light-headedness   Complete by: As directed    Call MD for:  persistant nausea and vomiting   Complete by: As directed    Call MD for:  severe uncontrolled pain   Complete by: As directed    Call MD for:   temperature >100.4   Complete by: As directed    Diet - low sodium heart healthy   Complete by: As directed    Diet Carb Modified   Complete by: As directed    Discharge instructions   Complete by: As directed    Please follow instructions given by the vascular surgeon.  Take your medications as prescribed.  You were cared for by a hospitalist during your hospital stay. If you have any questions about your discharge medications or the care you received while you were in the hospital after you are discharged, you can call the unit and asked to speak with the hospitalist on call if the hospitalist that took care of you is not available. Once you are discharged, your primary care physician will handle any further medical issues. Please note that NO REFILLS for any discharge medications will be authorized once you are discharged, as it is imperative that you return to your primary care physician (or establish a relationship with a primary care physician if you do not have one) for your aftercare needs so that they can reassess your need for medications and monitor your lab values. If you do not have a primary care physician, you can call [phone] for a physician referral.   Discharge wound care:   Complete by: As directed    Wound care  Daily      Comments: Apply betadine swab to toe.  Apply Xeroform or non adherent gauze such as adaptic to toe.  Wrap bilateral legs with kerlix from below toes to below knee and secure with Ace wrap.   Increase activity slowly   Complete by: As directed           Allergies as of 12/25/2022       Reactions   Chlorhexidine Gluconate Itching, Rash        Medication List     STOP taking these medications    HYDROcodone-acetaminophen 5-325 MG tablet Commonly known as: NORCO/VICODIN   oxyCODONE 5 MG immediate release tablet Commonly known as: Oxy IR/ROXICODONE  TAKE these medications    apixaban 2.5 MG Tabs tablet Commonly known as:  ELIQUIS Take 1 tablet (2.5 mg total) by mouth 2 (two) times daily.   ascorbic acid 1000 MG tablet Commonly known as: VITAMIN C Take 1 tablet (1,000 mg total) by mouth daily.   atorvastatin 40 MG tablet Commonly known as: LIPITOR Take 1 tablet (40 mg total) by mouth daily.   calcium acetate 667 MG capsule Commonly known as: PHOSLO Take 2 capsules (1,334 mg total) by mouth 3 (three) times daily with meals.   clopidogrel 75 MG tablet Commonly known as: PLAVIX Take 1 tablet (75 mg total) by mouth every evening.   cyanocobalamin 1000 MCG/ML injection Commonly known as: VITAMIN B12 INJECT 1ML IN THE MUSCLE EVERY 30 DAYS AS DIRECTED What changed: See the new instructions.   docusate sodium 100 MG capsule Commonly known as: COLACE Take 1 capsule (100 mg total) by mouth 2 (two) times daily.   fluticasone furoate-vilanterol 200-25 MCG/ACT Aepb Commonly known as: BREO ELLIPTA Inhale 1 puff into the lungs as needed (wheezing SOB).   furosemide 40 MG tablet Commonly known as: LASIX Take 40 mg by mouth daily.   isosorbide mononitrate 30 MG 24 hr tablet Commonly known as: IMDUR Take 1 tablet (30 mg total) by mouth daily.   letrozole 2.5 MG tablet Commonly known as: FEMARA Take 1 tablet (2.5 mg total) by mouth daily.   levETIRAcetam 500 MG tablet Commonly known as: KEPPRA TAKE 1 TABLET(500 MG) BY MOUTH TWICE DAILY   LORazepam 0.5 MG tablet Commonly known as: ATIVAN Take 1 tablet (0.5 mg total) by mouth every 6 (six) hours as needed for anxiety.   melatonin 3 MG Tabs tablet Take 1 tablet (3 mg total) by mouth at bedtime. What changed: additional instructions   methocarbamol 500 MG tablet Commonly known as: ROBAXIN Take 1 tablet (500 mg total) by mouth every 4 (four) hours as needed for muscle spasms.   metoprolol succinate 25 MG 24 hr tablet Commonly known as: TOPROL-XL Take 0.5 tablets (12.5 mg total) by mouth daily. Take with or immediately following a meal.    multivitamin with minerals Tabs tablet Take 1 tablet by mouth daily.   nitroGLYCERIN 0.4 MG SL tablet Commonly known as: NITROSTAT Place 0.4 mg under the tongue every 5 (five) minutes as needed for chest pain.   oxyCODONE-acetaminophen 5-325 MG tablet Commonly known as: PERCOCET/ROXICET Take 1-2 tablets by mouth every 6 (six) hours as needed for severe pain.   polyethylene glycol 17 g packet Commonly known as: MIRALAX / GLYCOLAX Take 17 g by mouth daily.   potassium chloride 10 MEQ tablet Commonly known as: KLOR-CON Take 10 mEq by mouth daily.   psyllium 95 % Pack Commonly known as: HYDROCIL/METAMUCIL Take 1 packet by mouth daily.   Toujeo Max SoloStar 300 UNIT/ML Solostar Pen Generic drug: insulin glargine (2 Unit Dial) Inject 6 Units into the skin at bedtime. What changed:  how much to take when to take this additional instructions   Vitamin D (Ergocalciferol) 1.25 MG (50000 UNIT) Caps capsule Commonly known as: DRISDOL Take 1 capsule (50,000 Units total) by mouth every 7 (seven) days. Start taking on: December 31, 2022               Durable Medical Equipment  (From admission, onward)           Start     Ordered   12/08/22 1825  For home use only DME Bedside commode  Once  Question:  Patient needs a bedside commode to treat with the following condition  Answer:  Generalized weakness   12/08/22 1824   12/08/22 1825  For home use only DME 3 n 1  Once        12/08/22 1824              Discharge Care Instructions  (From admission, onward)           Start     Ordered   12/25/22 0000  Discharge wound care:       Comments: Wound care  Daily      Comments: Apply betadine swab to toe.  Apply Xeroform or non adherent gauze such as adaptic to toe.  Wrap bilateral legs with kerlix from below toes to below knee and secure with Ace wrap.   12/25/22 0911              Follow-up Information     [facility] Follow up.   Why:  Bedside commode- to be delivered to the home. Contact information: [place] [address] [phone]         [facility], [facility] Follow up.   Specialty: Home Health Services Why: Registered Nurse, Physical and Occupational Therapy, Aide for bath-office to call with visit times. Contact information: [address] [place] [address] [phone]         [facility] Follow up in 1 month(s).   Why: The office will call the patient with an appointment Contact information: [place]: 79 minutes  [place]  [facility] Pager on [email or web address]  12/25/2022, 11:25 AM

## 2022-12-26 ENCOUNTER — Telehealth: Payer: Self-pay | Admitting: Physician Assistant

## 2022-12-26 LAB — BPAM RBC
Blood Product Expiration Date: 202401242359
ISSUE DATE / TIME: 202401181347
Unit Type and Rh: 9500

## 2022-12-26 LAB — TYPE AND SCREEN
ABO/RH(D): O NEG
Antibody Screen: NEGATIVE
Unit division: 0

## 2022-12-26 LAB — GLUCOSE, CAPILLARY: Glucose-Capillary: 243 mg/dL — ABNORMAL HIGH (ref 70–99)

## 2022-12-26 MED ORDER — HEPARIN SOD (PORK) LOCK FLUSH 100 UNIT/ML IV SOLN
250.0000 [IU] | INTRAVENOUS | Status: AC | PRN
  Administered 2022-12-26: 250 [IU]

## 2022-12-26 NOTE — Progress Notes (Signed)
Mobility Specialist - Progress Note   12/26/22 1216  Mobility  Activity Ambulated with assistance to bathroom  Level of Assistance Minimal assist, patient does 75% or more  Assistive Device Front wheel walker  Distance Ambulated (ft) 10 ft  Activity Response Tolerated well  Mobility Referral Yes  $Mobility charge 1 Mobility   Pt received in bed needing assistance to BR. Pt was MinA throughout. Pt was left on toilet and told to use call bell when finished.   Franki Monte  Mobility Specialist Please contact via Solicitor or Rehab office at [phone]

## 2022-12-26 NOTE — Progress Notes (Signed)
Mobility Specialist - Progress Note   12/26/22 1111  Mobility  Activity Transferred to/from [facility]  Level of Assistance Minimal assist, patient does 75% or more  Assistive Device Other (Comment) (BSC)  Activity Response Tolerated well  Mobility Referral Yes  $Mobility charge 1 Mobility   Pt was received in bed needing assistance to [facility]. No complaints throughout. Pt unable to have BM. Pt was returned to bed with all need a met.   Franki Monte  Mobility Specialist Please contact via Solicitor or Rehab office at [phone]

## 2022-12-26 NOTE — Progress Notes (Signed)
Patient was discharged yesterday but could not leave due to late completion of blood transfusion.  Patient did not have any issues overnight.  Vital signs are all stable.  Transfusion hemoglobin improved to 8.2.  Has been weaned off of oxygen.  Remained stable for discharge.  Please review discharge summary for further details.  Bonnielee Haff 12/26/2022

## 2022-12-26 NOTE — TOC Transition Note (Signed)
Transition of Care [facility]) - CM/SW Discharge Note   Patient Details  Name: [name] MRN: [id number] Date of Birth: October 27, 1941  [facility] CM/SW Contact:  Bethena Roys, RN Phone Number: 12/26/2022, 11:34 AM   Clinical Narrative: Patient will transition home today. [facility] aware that the patient will transition home and they are following for [facility] needs. Per MD, No oxygen needed for home. No further needs identified at this time.     Final next level of care: Home w Home Health Services Barriers to Discharge: Continued Medical Work up   Patient Goals and CMS Choice CMS Medicare.gov Compare Post Acute Care list provided to:: Patient Choice offered to / list presented to : Patient  Discharge Plan and Services Additional resources added to the After Visit Summary for   In-house Referral: NA Discharge Planning Services: CM Consult Post Acute Care Choice: Home Health          DME Arranged: Bedside commode DME Agency: [facility] Date DME Agency Contacted: 12/09/22 Time DME Agency Contacted: [phone] Representative spoke with at DME Agency: parachute HH Arranged: RN, Disease Management, PT, OT, Nurse's Aide [place] Agency: [place] Date [place]: 12/09/22 Time [place]: 1636 Representative spoke with at [place]: [place] ([place]) Interventions [place]: No Food Insecurity (12/08/2022)  Housing: Low Risk  (12/08/2022)  Transportation Needs: No Transportation Needs (12/08/2022)  Utilities: Not At Risk (12/08/2022)  Alcohol Screen: Low Risk  (10/22/2022)  Depression (PHQ2-9): Low Risk  (10/03/2022)  Financial Resource Strain: Low Risk  (10/22/2022)  Stress: No Stress Concern Present (05/12/2022)  Tobacco Use: Medium Risk (12/25/2022)   Readmission Risk Interventions    12/09/2022    4:35 PM  Readmission Risk Prevention Plan  Transportation Screening Complete  Medication Review  (RN Care Manager) Complete  HRI or [place] Complete  SW Recovery Care/Counseling Consult Complete  [place] Not Applicable

## 2022-12-26 NOTE — Progress Notes (Signed)
Physical Therapy Treatment Patient Details Name: [name] MRN: [id number] DOB: December 26, 1940 Today's Date: 12/26/2022   History of Present Illness 82 y.o. female admitted to [facility] on 12/06/2022 with suspected acute cystitis after presenting from home to [facility] ED complaining of generalized weakness and fall. PMH: AML, breast cancer, paroxysmal atrial fibrillation chronically anticoagulated on Eliquis, chronic diastolic heart failure, type 2 diabetes mellitus, CKD 4 associated baseline creatinine 1.8-3.0, anemia of chronic disease associated baseline hemoglobin 8-10,    PT Comments    Pt continues to progress towards her goals, reporting she is feeling better after surgery. Discussed need for continued mobilization with return home and keeping close watch on any changes to her L foot and leg. Pt verbalizes understanding. Pt with SoB during short bout of ambulation, however maintains SpO2 >93%O2 with ambulation. Pt looking forward to discharge home today.    Recommendations for follow up therapy are one component of a multi-disciplinary discharge planning process, led by the attending physician.  Recommendations may be updated based on patient status, additional functional criteria and insurance authorization.  Follow Up Recommendations  Home health PT     Assistance Recommended at Discharge Intermittent Supervision/Assistance  Patient can return home with the following A little help with walking and/or transfers;A little help with bathing/dressing/bathroom;Assistance with cooking/housework;Direct supervision/assist for medications management;Direct supervision/assist for financial management;Assist for transportation;Help with stairs or ramp for entrance   Equipment Recommendations  None recommended by PT       Precautions / Restrictions Precautions Precautions: Fall Restrictions Weight Bearing Restrictions: No LLE Weight Bearing: Weight bearing as tolerated     Mobility   Bed Mobility Overal bed mobility: Needs Assistance Bed Mobility: Sidelying to Sit, Sit to Sidelying Rolling: Modified independent (Device/Increase time) Sidelying to sit: Modified independent (Device/Increase time)     Sit to sidelying: Modified independent (Device/Increase time) General bed mobility comments: pt uses bed rails and HoB elevated, to perform all bed mobility    Transfers Overall transfer level: Needs assistance Equipment used: Rolling walker (2 wheels) Transfers: Sit to/from Stand Sit to Stand: Supervision           General transfer comment: supervision to come to standing    Ambulation/Gait Ambulation/Gait assistance: Min guard Gait Distance (Feet): 40 Feet Assistive device: Rolling walker (2 wheels) Gait Pattern/deviations: Step-through pattern, Decreased stride length, Trunk flexed Gait velocity: decreased Gait velocity interpretation: <1.31 ft/sec, indicative of household ambulator   General Gait Details: min guard for safety and management of lines with ambulation to door and back x2      Balance Overall balance assessment: Needs assistance Sitting-balance support: No upper extremity supported, Feet supported Sitting balance-Leahy Scale: Good     Standing balance support: Bilateral upper extremity supported, Reliant on assistive device for balance, During functional activity Standing balance-Leahy Scale: Fair Standing balance comment: requires UE support for dynamic activities                            Cognition Arousal/Alertness: Awake/alert Behavior During Therapy: WFL for tasks assessed/performed Overall Cognitive Status: Within Functional Limits for tasks assessed                                             General Comments General comments (skin integrity, edema, etc.): VSS on RA, SpO2 with ambulation >93%O2, max noted HR  110bpm      Pertinent Vitals/Pain Pain Assessment Pain Assessment: 0-10 Pain  Score: 5  Pain Location: L foot Pain Descriptors / Indicators: Discomfort, Guarding, Grimacing Pain Intervention(s): Limited activity within patient's tolerance, Monitored during session, Repositioned     PT Goals (current goals can now be found in the care plan section) Acute Rehab PT Goals Patient Stated Goal: home PT Goal Formulation: With patient Time For Goal Achievement: 12/22/22 Potential to Achieve Goals: Fair Progress towards PT goals: Progressing toward goals    Frequency    Min 3X/week      PT Plan Current plan remains appropriate    Co-evaluation              AM-PAC PT "6 Clicks" Mobility   Outcome Measure  Help needed turning from your back to your side while in a flat bed without using bedrails?: None Help needed moving from lying on your back to sitting on the side of a flat bed without using bedrails?: None Help needed moving to and from a bed to a chair (including a wheelchair)?: A Little Help needed standing up from a chair using your arms (e.g., wheelchair or bedside chair)?: A Little Help needed to walk in hospital room?: A Little Help needed climbing 3-5 steps with a railing? : A Lot 6 Click Score: 19    End of Session Equipment Utilized During Treatment: Gait belt Activity Tolerance: Patient tolerated treatment well Patient left: in bed;with bed alarm set;with call bell/phone within reach Nurse Communication: Mobility status PT Visit Diagnosis: Unsteadiness on feet (R26.81);Other abnormalities of gait and mobility (R26.89);Muscle weakness (generalized) (M62.81);Difficulty in walking, not elsewhere classified (R26.2);Pain Pain - Right/Left: Left Pain - part of body: Ankle and joints of foot     Time: 0802-0824 PT Time Calculation (min) (ACUTE ONLY): 22 min  Charges:  $Therapeutic Exercise: 8-22 mins                     Samera Macy B. Migdalia Dk PT, DPT Acute Rehabilitation Services Please use secure chat or  Call Office [phone]    [place] 12/26/2022, 9:34 AM

## 2022-12-26 NOTE — Telephone Encounter (Signed)
-----  Message from Karoline Caldwell, [place] sent at 12/25/2022  8:08 AM EST ----- S/p Angiogram, angioplasty LLE SFA by Dr. Virl Cagey. She needs follow up in 1 month with left lower extremity arterial duplex and ABI. Thanks

## 2022-12-29 ENCOUNTER — Telehealth: Payer: Self-pay

## 2022-12-29 ENCOUNTER — Telehealth: Payer: Self-pay | Admitting: *Deleted

## 2022-12-29 DIAGNOSIS — I5033 Acute on chronic diastolic (congestive) heart failure: Secondary | ICD-10-CM | POA: Diagnosis not present

## 2022-12-29 DIAGNOSIS — S91102D Unspecified open wound of left great toe without damage to nail, subsequent encounter: Secondary | ICD-10-CM | POA: Diagnosis not present

## 2022-12-29 DIAGNOSIS — I70222 Atherosclerosis of native arteries of extremities with rest pain, left leg: Secondary | ICD-10-CM | POA: Diagnosis not present

## 2022-12-29 DIAGNOSIS — I130 Hypertensive heart and chronic kidney disease with heart failure and stage 1 through stage 4 chronic kidney disease, or unspecified chronic kidney disease: Secondary | ICD-10-CM | POA: Diagnosis not present

## 2022-12-29 DIAGNOSIS — E1151 Type 2 diabetes mellitus with diabetic peripheral angiopathy without gangrene: Secondary | ICD-10-CM | POA: Diagnosis not present

## 2022-12-29 DIAGNOSIS — D631 Anemia in chronic kidney disease: Secondary | ICD-10-CM | POA: Diagnosis not present

## 2022-12-29 DIAGNOSIS — N184 Chronic kidney disease, stage 4 (severe): Secondary | ICD-10-CM | POA: Diagnosis not present

## 2022-12-29 DIAGNOSIS — E1122 Type 2 diabetes mellitus with diabetic chronic kidney disease: Secondary | ICD-10-CM | POA: Diagnosis not present

## 2022-12-29 DIAGNOSIS — J9621 Acute and chronic respiratory failure with hypoxia: Secondary | ICD-10-CM | POA: Diagnosis not present

## 2022-12-29 NOTE — Progress Notes (Signed)
  Care Coordination  Note  12/29/2022 Name: CHAR FELTMAN MRN: [id number] DOB: 05/22/1941  TUWANDA VOKES is a 82 y.o. year old primary care patient of Isaac Bliss, Rayford Halsted, MD. I reached out to [name] by phone today to assist with scheduling a follow up appointment. [name] verbally consented to my assistance.       Follow up plan: Hospital Follow Up appointment scheduled with (Dr Isaac Bliss) on (12/31/2022) at (230pm).  Julian Hy, [place] Direct Dial: [phone]

## 2022-12-29 NOTE — Patient Outreach (Signed)
  Care Coordination TOC Note Transition Care Management Follow-up Telephone Call Date of discharge and from where: 12/26/22-[place]  Dx: "weakness, acute cystitis" How have you been since you were released from the hospital? Patient states she is doing okay. She voices that she "can't walk to good" but is using walker. She is to begin therapy in the home today. Reviewed fall/safety measures. Patient has supportive spouse in the home to assist her.  Any questions or concerns? Yes-Patient reports that she gets a little short winded with exertion but relieved with rest. Her contract with oxygen supplier has ended and she had to have oxygen removed. Per notes in hospital home oxygen was no longer needed per MD. Patient states she will discuss with PCP during follow up appt about being re-evaluated to see if she qualifies for home oxygen.   Items Reviewed: Did the pt receive and understand the discharge instructions provided? Yes  Medications obtained and verified? Yes  Other? No  Any new allergies since your discharge? No  Dietary orders reviewed? Yes Do you have support at home? Yes   Home Care and Equipment/Supplies: Were home health services ordered? yes If so, what is the name of the agency? [facility]  Has the agency set up a time to come to the patient's home? Patient reports someone coming out today at noon to see her Were any new equipment or medical supplies ordered?  Yes: BSC What is the name of the medical supply agency? [employer] Were you able to get the supplies/equipment? Patient reports that [facility] was supposed to be delivered to her home but was delivered to hospital instead. Spouse has spoken with DME supplier as well as hospital staff and was told a BSC would be delivered to patient's home. He will follow up in DME not delivered within the next 24hrs Do you have any questions related to the use of the equipment or supplies? No  Functional Questionnaire: (I = Independent and D =  Dependent) ADLs: A  Bathing/Dressing- A  Meal Prep- A  Eating- I  Maintaining continence- I  Transferring/Ambulation- I  Managing Meds- I  Follow up appointments reviewed: PCP Hospital f/u appt confirmed? No -Patient agreeable to care guide calling to assist with scheduling. [facility] f/u appt confirmed?  Per d/c instructions vein/vascular office is to call patient for appt . Patient aware to call and follow up if she has not heard from in a few days. Are transportation arrangements needed? No  If their condition worsens, is the pt aware to call PCP or go to the Emergency Dept.? Yes Was the patient provided with contact information for the PCP's office or ED? Yes Was to pt encouraged to call back with questions or concerns? Yes  SDOH assessments and interventions completed:   Yes SDOH Interventions Today    Flowsheet Row Most Recent Value  SDOH Interventions   Food Insecurity Interventions Intervention Not Indicated  Transportation Interventions Intervention Not Indicated       Care Coordination Interventions:  PCP follow up appointment requested Education provided    Encounter Outcome:  Pt. Visit Completed    Enzo Montgomery, RN,BSN,CCM [place] Management Telephonic Care Management Coordinator Direct Phone: [phone] Toll Free: [phone] Fax: [phone]

## 2022-12-30 ENCOUNTER — Telehealth: Payer: Self-pay | Admitting: Cardiovascular Disease

## 2022-12-30 ENCOUNTER — Telehealth: Payer: Self-pay | Admitting: Internal Medicine

## 2022-12-30 DIAGNOSIS — E1151 Type 2 diabetes mellitus with diabetic peripheral angiopathy without gangrene: Secondary | ICD-10-CM | POA: Diagnosis not present

## 2022-12-30 DIAGNOSIS — E1122 Type 2 diabetes mellitus with diabetic chronic kidney disease: Secondary | ICD-10-CM | POA: Diagnosis not present

## 2022-12-30 DIAGNOSIS — I5033 Acute on chronic diastolic (congestive) heart failure: Secondary | ICD-10-CM | POA: Diagnosis not present

## 2022-12-30 DIAGNOSIS — D631 Anemia in chronic kidney disease: Secondary | ICD-10-CM | POA: Diagnosis not present

## 2022-12-30 DIAGNOSIS — J9621 Acute and chronic respiratory failure with hypoxia: Secondary | ICD-10-CM | POA: Diagnosis not present

## 2022-12-30 DIAGNOSIS — I70222 Atherosclerosis of native arteries of extremities with rest pain, left leg: Secondary | ICD-10-CM | POA: Diagnosis not present

## 2022-12-30 DIAGNOSIS — I130 Hypertensive heart and chronic kidney disease with heart failure and stage 1 through stage 4 chronic kidney disease, or unspecified chronic kidney disease: Secondary | ICD-10-CM | POA: Diagnosis not present

## 2022-12-30 DIAGNOSIS — N184 Chronic kidney disease, stage 4 (severe): Secondary | ICD-10-CM | POA: Diagnosis not present

## 2022-12-30 DIAGNOSIS — S91102D Unspecified open wound of left great toe without damage to nail, subsequent encounter: Secondary | ICD-10-CM | POA: Diagnosis not present

## 2022-12-30 NOTE — Telephone Encounter (Signed)
Caller stated patient was in the hospital and he met her for the first time today.  Caller reported:  Bilateral below the knee pitting +3, lungs - globally reduced sound, no sounds in the right lower lungs, weight today was 154, weight yesterday was 153.6.  Patient stated she gets tired easily and has SOB. BP today was 86/60. Respiration  - 22, HR 72

## 2022-12-30 NOTE — Telephone Encounter (Signed)
Evaluation performed recommend PT 2x4/1x4. BP 86/60 knees down has +3 edema. Left message with cardiologist. Lung fields reduced amount of noise, no sound in bottom right. Patient only sat in wheelchair because she felt poor.

## 2022-12-30 NOTE — Telephone Encounter (Signed)
Spoke with Clair Gulling from [facility] who states that he just saw the patient and reports that her legs are swollen with 3+ pitting edema. He states that she has not gained any weight. She was 157lbs yesterday and is 154lbs today. He reports decreased lung sounds. She gets short of breath with exertion. Patient refused to do any activities other than sitting in the chair today. Her O2 was 93% at rest. He states when he first arrived after patient was moving around respirations were at 22, after resting came down to 15.   I called the patient who states that she is feeling fine right now. She has no concerns. She states that she is resting. She does admit to shortness of breath with exertion. States that she feels the same as she did when she left the hospital. Does not feel like she did prior to hospitalization. She states her swelling is about the same. Denies any chest pain, dizziness, or lightheadedness. Patient states that she had a nurse come out and arrange her medications so she should be taking her Lasix as prescribed but can't confirm. Patient aware of return to ER precautions.

## 2022-12-31 ENCOUNTER — Ambulatory Visit (INDEPENDENT_AMBULATORY_CARE_PROVIDER_SITE_OTHER): Payer: Medicare HMO | Admitting: Internal Medicine

## 2022-12-31 ENCOUNTER — Encounter: Payer: Self-pay | Admitting: Internal Medicine

## 2022-12-31 ENCOUNTER — Encounter (HOSPITAL_COMMUNITY): Payer: Self-pay | Admitting: Emergency Medicine

## 2022-12-31 ENCOUNTER — Emergency Department (HOSPITAL_COMMUNITY): Payer: Medicare HMO

## 2022-12-31 ENCOUNTER — Inpatient Hospital Stay (HOSPITAL_COMMUNITY)
Admission: EM | Admit: 2022-12-31 | Discharge: 2023-01-09 | DRG: 239 | Disposition: A | Discharge status: Skilled Nursing Facility | Payer: Medicare HMO | Source: Ambulatory Visit | Attending: Internal Medicine | Admitting: Internal Medicine

## 2022-12-31 VITALS — BP 136/51 | HR 66 | Temp 97.5°F

## 2022-12-31 DIAGNOSIS — G40909 Epilepsy, unspecified, not intractable, without status epilepticus: Secondary | ICD-10-CM | POA: Diagnosis present

## 2022-12-31 DIAGNOSIS — Z808 Family history of malignant neoplasm of other organs or systems: Secondary | ICD-10-CM

## 2022-12-31 DIAGNOSIS — Z515 Encounter for palliative care: Secondary | ICD-10-CM | POA: Diagnosis not present

## 2022-12-31 DIAGNOSIS — I70262 Atherosclerosis of native arteries of extremities with gangrene, left leg: Secondary | ICD-10-CM | POA: Diagnosis present

## 2022-12-31 DIAGNOSIS — N184 Chronic kidney disease, stage 4 (severe): Secondary | ICD-10-CM | POA: Diagnosis present

## 2022-12-31 DIAGNOSIS — Z79811 Long term (current) use of aromatase inhibitors: Secondary | ICD-10-CM

## 2022-12-31 DIAGNOSIS — Z66 Do not resuscitate: Secondary | ICD-10-CM | POA: Diagnosis present

## 2022-12-31 DIAGNOSIS — I110 Hypertensive heart disease with heart failure: Secondary | ICD-10-CM | POA: Diagnosis not present

## 2022-12-31 DIAGNOSIS — Z7901 Long term (current) use of anticoagulants: Secondary | ICD-10-CM

## 2022-12-31 DIAGNOSIS — J90 Pleural effusion, not elsewhere classified: Secondary | ICD-10-CM | POA: Diagnosis not present

## 2022-12-31 DIAGNOSIS — Z9841 Cataract extraction status, right eye: Secondary | ICD-10-CM

## 2022-12-31 DIAGNOSIS — I96 Gangrene, not elsewhere classified: Secondary | ICD-10-CM

## 2022-12-31 DIAGNOSIS — E119 Type 2 diabetes mellitus without complications: Secondary | ICD-10-CM | POA: Diagnosis not present

## 2022-12-31 DIAGNOSIS — I2510 Atherosclerotic heart disease of native coronary artery without angina pectoris: Secondary | ICD-10-CM | POA: Diagnosis not present

## 2022-12-31 DIAGNOSIS — I739 Peripheral vascular disease, unspecified: Secondary | ICD-10-CM | POA: Diagnosis present

## 2022-12-31 DIAGNOSIS — Z801 Family history of malignant neoplasm of trachea, bronchus and lung: Secondary | ICD-10-CM

## 2022-12-31 DIAGNOSIS — J9621 Acute and chronic respiratory failure with hypoxia: Principal | ICD-10-CM | POA: Diagnosis present

## 2022-12-31 DIAGNOSIS — Z7952 Long term (current) use of systemic steroids: Secondary | ICD-10-CM

## 2022-12-31 DIAGNOSIS — Z09 Encounter for follow-up examination after completed treatment for conditions other than malignant neoplasm: Secondary | ICD-10-CM | POA: Diagnosis not present

## 2022-12-31 DIAGNOSIS — G8929 Other chronic pain: Secondary | ICD-10-CM | POA: Diagnosis present

## 2022-12-31 DIAGNOSIS — N189 Chronic kidney disease, unspecified: Secondary | ICD-10-CM | POA: Diagnosis not present

## 2022-12-31 DIAGNOSIS — I509 Heart failure, unspecified: Secondary | ICD-10-CM

## 2022-12-31 DIAGNOSIS — J811 Chronic pulmonary edema: Secondary | ICD-10-CM | POA: Diagnosis not present

## 2022-12-31 DIAGNOSIS — Z1152 Encounter for screening for COVID-19: Secondary | ICD-10-CM

## 2022-12-31 DIAGNOSIS — Z8249 Family history of ischemic heart disease and other diseases of the circulatory system: Secondary | ICD-10-CM

## 2022-12-31 DIAGNOSIS — Z794 Long term (current) use of insulin: Secondary | ICD-10-CM

## 2022-12-31 DIAGNOSIS — E11649 Type 2 diabetes mellitus with hypoglycemia without coma: Secondary | ICD-10-CM | POA: Diagnosis not present

## 2022-12-31 DIAGNOSIS — I5033 Acute on chronic diastolic (congestive) heart failure: Secondary | ICD-10-CM | POA: Diagnosis present

## 2022-12-31 DIAGNOSIS — Z79899 Other long term (current) drug therapy: Secondary | ICD-10-CM

## 2022-12-31 DIAGNOSIS — E1122 Type 2 diabetes mellitus with diabetic chronic kidney disease: Secondary | ICD-10-CM | POA: Diagnosis present

## 2022-12-31 DIAGNOSIS — Z90710 Acquired absence of both cervix and uterus: Secondary | ICD-10-CM

## 2022-12-31 DIAGNOSIS — Z79891 Long term (current) use of opiate analgesic: Secondary | ICD-10-CM

## 2022-12-31 DIAGNOSIS — Z87442 Personal history of urinary calculi: Secondary | ICD-10-CM

## 2022-12-31 DIAGNOSIS — I252 Old myocardial infarction: Secondary | ICD-10-CM | POA: Diagnosis not present

## 2022-12-31 DIAGNOSIS — M25552 Pain in left hip: Secondary | ICD-10-CM | POA: Diagnosis not present

## 2022-12-31 DIAGNOSIS — Z86010 Personal history of colonic polyps: Secondary | ICD-10-CM

## 2022-12-31 DIAGNOSIS — Z7902 Long term (current) use of antithrombotics/antiplatelets: Secondary | ICD-10-CM

## 2022-12-31 DIAGNOSIS — R2689 Other abnormalities of gait and mobility: Secondary | ICD-10-CM | POA: Diagnosis not present

## 2022-12-31 DIAGNOSIS — Z955 Presence of coronary angioplasty implant and graft: Secondary | ICD-10-CM

## 2022-12-31 DIAGNOSIS — E1152 Type 2 diabetes mellitus with diabetic peripheral angiopathy with gangrene: Principal | ICD-10-CM | POA: Diagnosis present

## 2022-12-31 DIAGNOSIS — J449 Chronic obstructive pulmonary disease, unspecified: Secondary | ICD-10-CM | POA: Diagnosis present

## 2022-12-31 DIAGNOSIS — D631 Anemia in chronic kidney disease: Secondary | ICD-10-CM | POA: Diagnosis present

## 2022-12-31 DIAGNOSIS — E669 Obesity, unspecified: Secondary | ICD-10-CM | POA: Diagnosis present

## 2022-12-31 DIAGNOSIS — Z741 Need for assistance with personal care: Secondary | ICD-10-CM | POA: Diagnosis not present

## 2022-12-31 DIAGNOSIS — E440 Moderate protein-calorie malnutrition: Secondary | ICD-10-CM | POA: Diagnosis present

## 2022-12-31 DIAGNOSIS — R5383 Other fatigue: Secondary | ICD-10-CM | POA: Diagnosis not present

## 2022-12-31 DIAGNOSIS — R54 Age-related physical debility: Secondary | ICD-10-CM | POA: Diagnosis present

## 2022-12-31 DIAGNOSIS — Z87891 Personal history of nicotine dependence: Secondary | ICD-10-CM | POA: Diagnosis not present

## 2022-12-31 DIAGNOSIS — Z79890 Hormone replacement therapy (postmenopausal): Secondary | ICD-10-CM

## 2022-12-31 DIAGNOSIS — Z8049 Family history of malignant neoplasm of other genital organs: Secondary | ICD-10-CM

## 2022-12-31 DIAGNOSIS — Z9842 Cataract extraction status, left eye: Secondary | ICD-10-CM

## 2022-12-31 DIAGNOSIS — I130 Hypertensive heart and chronic kidney disease with heart failure and stage 1 through stage 4 chronic kidney disease, or unspecified chronic kidney disease: Secondary | ICD-10-CM | POA: Diagnosis present

## 2022-12-31 DIAGNOSIS — I480 Paroxysmal atrial fibrillation: Secondary | ICD-10-CM | POA: Diagnosis present

## 2022-12-31 DIAGNOSIS — M6281 Muscle weakness (generalized): Secondary | ICD-10-CM | POA: Diagnosis not present

## 2022-12-31 DIAGNOSIS — E1165 Type 2 diabetes mellitus with hyperglycemia: Secondary | ICD-10-CM | POA: Diagnosis present

## 2022-12-31 DIAGNOSIS — Z806 Family history of leukemia: Secondary | ICD-10-CM

## 2022-12-31 DIAGNOSIS — M1990 Unspecified osteoarthritis, unspecified site: Secondary | ICD-10-CM | POA: Diagnosis present

## 2022-12-31 DIAGNOSIS — Z6830 Body mass index (BMI) 30.0-30.9, adult: Secondary | ICD-10-CM

## 2022-12-31 DIAGNOSIS — Z8542 Personal history of malignant neoplasm of other parts of uterus: Secondary | ICD-10-CM

## 2022-12-31 DIAGNOSIS — M7989 Other specified soft tissue disorders: Secondary | ICD-10-CM | POA: Diagnosis not present

## 2022-12-31 DIAGNOSIS — M79672 Pain in left foot: Secondary | ICD-10-CM | POA: Diagnosis not present

## 2022-12-31 DIAGNOSIS — Z803 Family history of malignant neoplasm of breast: Secondary | ICD-10-CM

## 2022-12-31 DIAGNOSIS — S98912A Complete traumatic amputation of left foot, level unspecified, initial encounter: Secondary | ICD-10-CM | POA: Diagnosis not present

## 2022-12-31 DIAGNOSIS — E785 Hyperlipidemia, unspecified: Secondary | ICD-10-CM | POA: Diagnosis present

## 2022-12-31 DIAGNOSIS — Z9889 Other specified postprocedural states: Secondary | ICD-10-CM | POA: Diagnosis not present

## 2022-12-31 DIAGNOSIS — S98112A Complete traumatic amputation of left great toe, initial encounter: Secondary | ICD-10-CM | POA: Diagnosis not present

## 2022-12-31 DIAGNOSIS — Z7401 Bed confinement status: Secondary | ICD-10-CM | POA: Diagnosis not present

## 2022-12-31 DIAGNOSIS — R1311 Dysphagia, oral phase: Secondary | ICD-10-CM | POA: Diagnosis not present

## 2022-12-31 DIAGNOSIS — M6259 Muscle wasting and atrophy, not elsewhere classified, multiple sites: Secondary | ICD-10-CM | POA: Diagnosis not present

## 2022-12-31 DIAGNOSIS — C9201 Acute myeloblastic leukemia, in remission: Secondary | ICD-10-CM | POA: Diagnosis present

## 2022-12-31 DIAGNOSIS — Z825 Family history of asthma and other chronic lower respiratory diseases: Secondary | ICD-10-CM

## 2022-12-31 DIAGNOSIS — J9811 Atelectasis: Secondary | ICD-10-CM | POA: Diagnosis not present

## 2022-12-31 DIAGNOSIS — Z888 Allergy status to other drugs, medicaments and biological substances status: Secondary | ICD-10-CM

## 2022-12-31 DIAGNOSIS — Z800 Family history of malignant neoplasm of digestive organs: Secondary | ICD-10-CM

## 2022-12-31 DIAGNOSIS — Z961 Presence of intraocular lens: Secondary | ICD-10-CM | POA: Diagnosis present

## 2022-12-31 DIAGNOSIS — Z823 Family history of stroke: Secondary | ICD-10-CM

## 2022-12-31 LAB — BASIC METABOLIC PANEL
Anion gap: 11 (ref 5–15)
BUN: 85 mg/dL — ABNORMAL HIGH (ref 8–23)
CO2: 27 mmol/L (ref 22–32)
Calcium: 6.2 mg/dL — CL (ref 8.9–10.3)
Chloride: 102 mmol/L (ref 98–111)
Creatinine, Ser: 2.38 mg/dL — ABNORMAL HIGH (ref 0.44–1.00)
GFR, Estimated: 20 mL/min — ABNORMAL LOW (ref >60)
Glucose, Bld: 244 mg/dL — ABNORMAL HIGH (ref 70–99)
Potassium: 4 mmol/L (ref 3.5–5.1)
Sodium: 140 mmol/L (ref 135–145)

## 2022-12-31 LAB — CBC WITH DIFFERENTIAL/PLATELET
Abs Immature Granulocytes: 0.02 10*3/uL (ref 0.00–0.07)
Basophils Absolute: 0 10*3/uL (ref 0.0–0.1)
Basophils Relative: 0 %
Eosinophils Absolute: 0 10*3/uL (ref 0.0–0.5)
Eosinophils Relative: 0 %
HCT: 30.8 % — ABNORMAL LOW (ref 36.0–46.0)
Hemoglobin: 9.6 g/dL — ABNORMAL LOW (ref 12.0–15.0)
Immature Granulocytes: 0 %
Lymphocytes Relative: 13 %
Lymphs Abs: 0.8 10*3/uL (ref 0.7–4.0)
MCH: 31.1 pg (ref 26.0–34.0)
MCHC: 31.2 g/dL (ref 30.0–36.0)
MCV: 99.7 fL (ref 80.0–100.0)
Monocytes Absolute: 0.4 10*3/uL (ref 0.1–1.0)
Monocytes Relative: 6 %
Neutro Abs: 4.6 10*3/uL (ref 1.7–7.7)
Neutrophils Relative %: 81 %
Platelets: 156 10*3/uL (ref 150–400)
RBC: 3.09 MIL/uL — ABNORMAL LOW (ref 3.87–5.11)
RDW: 17.2 % — ABNORMAL HIGH (ref 11.5–15.5)
WBC: 5.8 10*3/uL (ref 4.0–10.5)
nRBC: 0 % (ref 0.0–0.2)

## 2022-12-31 LAB — LACTIC ACID, PLASMA: Lactic Acid, Venous: 1.2 mmol/L (ref 0.5–1.9)

## 2022-12-31 NOTE — ED Provider Triage Note (Signed)
Emergency Medicine Provider Triage Evaluation Note  HITOMI SLAPE , a 82 y.o. female  was evaluated in triage.  Pt complains of worsening wounds on the left foot.  States initially started as a blister.  Has been changing her bandages much as possible, however has had difficulty taking care of it herself at home.  Last changed yesterday.  Noticed increasing redness and decreased sensation.  Now states the toe feels completely numb.  Denies fevers or weight loss.  Hx of DMT2, CKD, leukemia, CHF, CAD, HTN  Review of Systems  Positive:        See above  Negative:   Physical Exam  BP 133/81   Pulse 88   Temp 98.1 F (36.7 C)   Resp 20   Ht '5\' 2"'$  (1.575 m)   Wt 70.3 kg   SpO2 93%   BMI 28.35 kg/m  Gen:   Awake, no distress   Resp:  Normal effort  MSK:   See below Other:  See below         Medical Decision Making  Medically screening exam initiated at 5:42 PM.  Appropriate orders placed.  HARLEM THRESHER was informed that the remainder of the evaluation will be completed by another provider, this initial triage assessment does not replace that evaluation, and the importance of remaining in the ED until their evaluation is complete.  Discussed pt with charge RN, pt to come back to room as soon as possible   Prince Rome, [place] 71/16/57 1748

## 2022-12-31 NOTE — Telephone Encounter (Signed)
Patient was seen in the office 12/31/22 and was sent to the hospital.

## 2022-12-31 NOTE — ED Notes (Signed)
[name] (pt's sister) called and is very concerned. Wants to know if someone is with the patient. She has been informed pt is in triage

## 2022-12-31 NOTE — Progress Notes (Signed)
Established Patient Office Visit     CC/Reason for Visit: Hospital discharge follow-up  HPI: [name] is a 82 y.o. female who is coming in today for the above mentioned reasons. Past Medical History is significant for: Uncontrolled insulin-dependent diabetes, coronary artery disease, hypertension, hyperlipidemia, history of AML.  Most importantly she was recently admitted from 12/06/2022 to 12/25/2022 for generalized weakness and a UTI.  Found to have acute on chronic kidney disease stage IV and acute on chronic heart failure with preserved ejection fraction..  She had a nonhealing wound, vascular performed an arteriogram with balloon angioplasty.  Since being discharged on the 18th, she has been extremely weak, unable to get up on her own, very short of breath with even minimal exertion.  Has been urinating and defecating on herself.  Her only help at home is an elderly brother who is also handicapped.  I received a call from home health yesterday.  She was hypotensive to 86/60, hypoxic, with 3+ edema and very short of breath even with minimal exertion.  Today she is quite ill-appearing, short of breath, unable to get out of her wheelchair, needed to be helped by 2 of our staff just to get out of her vehicle.  She seems very pale.  She tells me she is unable to manage at home.   Past Medical/Surgical History: Past Medical History:  Diagnosis Date   Acute myelogenous leukemia ([place]) 02/2014   Acute pancreatitis    Arthritis    "back" (09/17/2017)   Atrial flutter (HCC)    Cardiomyopathy (HCC)    CHF (congestive heart failure) (HCC)    Chronic kidney disease, stage II (mild)    Chronic lower back pain    Colon polyps 04/29/2010   TUBULAR ADENOMA AND A SERRATED ADENOMA   COPD (chronic obstructive pulmonary disease) ([place])    CORONARY ARTERY DISEASE 12/24/2007   DIABETES MELLITUS, TYPE II 07/13/2007   History of blood transfusion 2015   "related to leukemia"   History of kidney  stones 12/24/2007   History of uterine cancer    HYPERLIPIDEMIA 12/24/2007   HYPERTENSION 07/13/2007   NSTEMI (non-ST elevated myocardial infarction) ([place]) 09/17/2017   Uterine cancer ([place])     Past Surgical History:  Procedure Laterality Date   ABDOMINAL AORTOGRAM W/LOWER EXTREMITY Bilateral 12/24/2022   Procedure: ABDOMINAL AORTOGRAM W/LOWER EXTREMITY;  Surgeon: Broadus John, MD;  Location: [facility];  Service: Cardiovascular;  Laterality: Bilateral;   ABDOMINAL HYSTERECTOMY     "still have my ovaries"   BREAST BIOPSY Left 09/22/2019   BREAST LUMPECTOMY Left 10/24/2019   BREAST LUMPECTOMY WITH RADIOACTIVE SEED LOCALIZATION Left 10/24/2019   Procedure: LEFT BREAST LUMPECTOMY WITH RADIOACTIVE SEED LOCALIZATION;  Surgeon: Jovita Kussmaul, MD;  Location: [facility];  Service: General;  Laterality: Left;   CARDIAC CATHETERIZATION  2002   CARDIAC CATHETERIZATION  09/17/2017   CATARACT EXTRACTION W/ INTRAOCULAR LENS  IMPLANT, BILATERAL Bilateral    COLONOSCOPY W/ BIOPSIES AND POLYPECTOMY  2011   CORONARY STENT INTERVENTION N/A 09/21/2017   Procedure: CORONARY STENT INTERVENTION;  Surgeon: Troy Sine, MD;  Location: [facility];  Service: Cardiovascular;  Laterality: N/A;   DILATION AND CURETTAGE OF UTERUS     ESOPHAGOGASTRODUODENOSCOPY N/A 01/30/2022   Procedure: ESOPHAGOGASTRODUODENOSCOPY (EGD);  Surgeon: Milus Banister, MD;  Location: [facility];  Service: Endoscopy;  Laterality: N/A;   EUS N/A 01/30/2022   Procedure: UPPER ENDOSCOPIC ULTRASOUND (EUS) RADIAL;  Surgeon:  Milus Banister, MD;  Location: [facility];  Service: Endoscopy;  Laterality: N/A;   EXCISIONAL HEMORRHOIDECTOMY  X 2   FINE NEEDLE ASPIRATION N/A 01/30/2022   Procedure: FINE NEEDLE ASPIRATION (FNA) LINEAR;  Surgeon: Milus Banister, MD;  Location: [facility];  Service: Endoscopy;  Laterality: N/A;   GANGLION CYST EXCISION Left X 3   INTRAMEDULLARY (IM) NAIL INTERTROCHANTERIC  Right 08/30/2022   Procedure: INTRAMEDULLARY (IM) NAIL INTERTROCHANTERIC;  Surgeon: Altamese Deckerville, MD;  Location: [facility];  Service: Orthopedics;  Laterality: Right;   INTRAVASCULAR PRESSURE WIRE/FFR STUDY N/A 09/17/2017   Procedure: INTRAVASCULAR PRESSURE WIRE/FFR STUDY;  Surgeon: Nelva Bush, MD;  Location: [facility];  Service: Cardiovascular;  Laterality: N/A;   IR THORACENTESIS ASP PLEURAL SPACE W/IMG GUIDE  10/27/2022   NASAL SINUS SURGERY     PERIPHERAL VASCULAR BALLOON ANGIOPLASTY  12/24/2022   Procedure: PERIPHERAL VASCULAR BALLOON ANGIOPLASTY;  Surgeon: Broadus John, MD;  Location: [facility];  Service: Cardiovascular;;  Lt. SFA   PORTA CATH INSERTION Right 2013   RIGHT/LEFT HEART CATH AND CORONARY ANGIOGRAPHY N/A 09/17/2017   Procedure: RIGHT/LEFT HEART CATH AND CORONARY ANGIOGRAPHY;  Surgeon: Nelva Bush, MD;  Location: [facility];  Service: Cardiovascular;  Laterality: N/A;   TEE WITHOUT CARDIOVERSION N/A 11/19/2022   Procedure: TRANSESOPHAGEAL ECHOCARDIOGRAM (TEE);  Surgeon: Hebert Soho, DO;  Location: [facility];  Service: Cardiovascular;  Laterality: N/A;   TUBAL LIGATION      Social History:  reports that she quit smoking about 22 years ago. Her smoking use included cigarettes. She has a 20.00 pack-year smoking history. She has never used smokeless tobacco. She reports that she does not drink alcohol and does not use drugs.  Allergies: Allergies  Allergen Reactions   Chlorhexidine Gluconate Itching and Rash    Family History:  Family History  Problem Relation Age of Onset   COPD Father    Esophageal cancer Father    Breast cancer Sister 78   Lung cancer Sister    Esophageal cancer Brother    Suicidality Brother    Lung cancer Sister    Lung cancer Sister    Cervical cancer Sister    Congestive Heart Failure Mother    Heart attack Maternal Grandmother    Skin cancer Maternal Grandfather    Heart attack Maternal  Grandfather    Stroke Paternal Grandmother 50   Heart attack Paternal Grandfather    Cancer Maternal Uncle        unknown type, dx. >65   Cancer Paternal Aunt        unknown type, dx. >50   Cancer Paternal Uncle        unknown type, dx. >50   Cirrhosis Maternal Uncle    Skin cancer Daughter    Leukemia Cousin        maternal first cousin; dx. >50, in remission   Cancer Niece        unknown type behind her ear, dx. 30s/40s   Colon cancer Neg Hx      Current Outpatient Medications:    apixaban (ELIQUIS) 2.5 MG TABS tablet, Take 1 tablet (2.5 mg total) by mouth 2 (two) times daily., Disp: 60 tablet, Rfl: 0   ascorbic acid (VITAMIN C) 1000 MG tablet, Take 1 tablet (1,000 mg total) by mouth daily., Disp: 30 tablet, Rfl: 0   atorvastatin (LIPITOR) 40 MG tablet, Take 1 tablet (40 mg total) by mouth daily., Disp: 30 tablet, Rfl: 0  calcium acetate (PHOSLO) 667 MG capsule, Take 2 capsules (1,334 mg total) by mouth 3 (three) times daily with meals., Disp: 180 capsule, Rfl: 2   clopidogrel (PLAVIX) 75 MG tablet, Take 1 tablet (75 mg total) by mouth every evening., Disp: 30 tablet, Rfl: 0   cyanocobalamin (VITAMIN B12) 1000 MCG/ML injection, INJECT 1ML IN THE MUSCLE EVERY 30 DAYS AS DIRECTED (Patient taking differently: Inject 1,000 mcg into the muscle every 30 (thirty) days.), Disp: 3 mL, Rfl: 3   docusate sodium (COLACE) 100 MG capsule, Take 1 capsule (100 mg total) by mouth 2 (two) times daily., Disp: 10 capsule, Rfl: 0   fluticasone furoate-vilanterol (BREO ELLIPTA) 200-25 MCG/ACT AEPB, Inhale 1 puff into the lungs as needed (wheezing SOB)., Disp: 1 each, Rfl: 0   furosemide (LASIX) 40 MG tablet, Take 40 mg by mouth daily., Disp: , Rfl:    insulin glargine, 2 Unit Dial, (TOUJEO MAX SOLOSTAR) 300 UNIT/ML Solostar Pen, Inject 6 Units into the skin at bedtime. (Patient taking differently: Inject 10-16 Units into the skin See admin instructions. Inject 10 units into the skin at supper and 16 units  at bedtime), Disp: 15 mL, Rfl: 0   isosorbide mononitrate (IMDUR) 30 MG 24 hr tablet, Take 1 tablet (30 mg total) by mouth daily., Disp: 30 tablet, Rfl: 0   letrozole (FEMARA) 2.5 MG tablet, Take 1 tablet (2.5 mg total) by mouth daily., Disp: 90 tablet, Rfl: 3   levETIRAcetam (KEPPRA) 500 MG tablet, TAKE 1 TABLET(500 MG) BY MOUTH TWICE DAILY, Disp: 180 tablet, Rfl: 0   LORazepam (ATIVAN) 0.5 MG tablet, Take 1 tablet (0.5 mg total) by mouth every 6 (six) hours as needed for anxiety., Disp: 30 tablet, Rfl: 0   melatonin 3 MG TABS tablet, Take 1 tablet (3 mg total) by mouth at bedtime. (Patient taking differently: Take 3 mg by mouth at bedtime. As needed), Disp: 30 tablet, Rfl: 0   methocarbamol (ROBAXIN) 500 MG tablet, Take 1 tablet (500 mg total) by mouth every 4 (four) hours as needed for muscle spasms., Disp: 60 tablet, Rfl: 0   metoprolol succinate (TOPROL-XL) 25 MG 24 hr tablet, Take 0.5 tablets (12.5 mg total) by mouth daily. Take with or immediately following a meal., Disp: 30 tablet, Rfl: 0   Multiple Vitamin (MULTIVITAMIN WITH MINERALS) TABS tablet, Take 1 tablet by mouth daily., Disp: , Rfl:    nitroGLYCERIN (NITROSTAT) 0.4 MG SL tablet, Place 0.4 mg under the tongue every 5 (five) minutes as needed for chest pain., Disp: , Rfl:    oxyCODONE-acetaminophen (PERCOCET/ROXICET) 5-325 MG tablet, Take 1-2 tablets by mouth every 6 (six) hours as needed for severe pain., Disp: 30 tablet, Rfl: 0   polyethylene glycol (MIRALAX / GLYCOLAX) 17 g packet, Take 17 g by mouth daily., Disp: 14 each, Rfl: 0   potassium chloride (KLOR-CON) 10 MEQ tablet, Take 10 mEq by mouth daily., Disp: , Rfl:    psyllium (HYDROCIL/METAMUCIL) 95 % PACK, Take 1 packet by mouth daily., Disp: 240 each, Rfl:    Vitamin D, Ergocalciferol, (DRISDOL) 1.25 MG (50000 UNIT) CAPS capsule, Take 1 capsule (50,000 Units total) by mouth every 7 (seven) days., Disp: 5 capsule, Rfl: 0 No current facility-administered medications for this  visit.  Facility-Administered Medications Ordered in Other Visits:    sodium chloride 0.9 % 1,000 mL with magnesium sulfate 2 g infusion, , Intravenous, Continuous, Ladell Pier, MD, Stopped at 08/04/14 1441  Review of Systems:  Negative unless indicated in HPI.  Physical Exam: Vitals:   12/31/22 1441  BP: (!) 136/51  Pulse: 66  Temp: (!) 97.5 F (36.4 C)  TempSrc: Oral  SpO2: 91%    There is no height or weight on file to calculate BMI.   Physical Exam Vitals reviewed.  Constitutional:      General: She is in acute distress.     Appearance: Normal appearance. She is ill-appearing.  HENT:     Head: Normocephalic and atraumatic.  Eyes:     Conjunctiva/sclera: Conjunctivae normal.     Pupils: Pupils are equal, round, and reactive to light.  Cardiovascular:     Rate and Rhythm: Normal rate. Rhythm irregular.  Pulmonary:     Effort: Pulmonary effort is normal. Tachypnea present.     Breath sounds: Examination of the right-upper field reveals decreased breath sounds. Examination of the right-middle field reveals decreased breath sounds. Examination of the right-lower field reveals decreased breath sounds. Decreased breath sounds present.  Musculoskeletal:     Right lower leg: 3+ Pitting Edema present.     Left lower leg: 3+ Pitting Edema present.  Skin:    General: Skin is warm and dry.  Neurological:     General: No focal deficit present.     Mental Status: She is alert and oriented to person, place, and time.  Psychiatric:        Mood and Affect: Mood normal.        Behavior: Behavior normal.        Thought Content: Thought content normal.        Judgment: Judgment normal.      Impression and Plan:  Hospital discharge follow-up  -She is hypoxic at rest to 91%, is visibly short of breath, has 3+ pitting edema of bilateral lower extremities.  She also has obliteration of right lung sounds that make me think she might have some pulmonary edema/pleural  effusion. -She has advanced chronic kidney disease and known heart failure with preserved ejection fraction with recent hospitalization for UTI, weakness as well as peripheral vascular disease status post arteriogram and balloon angioplasty of the left lower extremity. -ED evaluation recommended today, I would anticipate potential hospital admission given findings on exam today.  I think it would be worthwhile to consider palliative care consultation during this hospitalization. -If she is discharged home she will need significant resources.  Her only relative is an elderly brother who is handicapped himself.  Time spent:34 minutes reviewing chart, interviewing and examining patient and formulating plan of care.     Lelon Frohlich, MD [facility]

## 2022-12-31 NOTE — ED Triage Notes (Signed)
Pt states she was sent in by her PCP for concern of leg swelling and wound on left foot that has been going on for approx 1 month. Pt states the swelling has made it hard for her to take care of herself at home. Pt also endorses shortness of breath that worsens with exertion for the past couple months.

## 2023-01-01 ENCOUNTER — Inpatient Hospital Stay (HOSPITAL_COMMUNITY): Payer: Medicare HMO

## 2023-01-01 ENCOUNTER — Telehealth: Payer: Self-pay | Admitting: Internal Medicine

## 2023-01-01 ENCOUNTER — Emergency Department (HOSPITAL_COMMUNITY): Payer: Medicare HMO

## 2023-01-01 DIAGNOSIS — Z66 Do not resuscitate: Secondary | ICD-10-CM | POA: Diagnosis present

## 2023-01-01 DIAGNOSIS — N189 Chronic kidney disease, unspecified: Secondary | ICD-10-CM | POA: Diagnosis not present

## 2023-01-01 DIAGNOSIS — I130 Hypertensive heart and chronic kidney disease with heart failure and stage 1 through stage 4 chronic kidney disease, or unspecified chronic kidney disease: Secondary | ICD-10-CM | POA: Diagnosis present

## 2023-01-01 DIAGNOSIS — I96 Gangrene, not elsewhere classified: Secondary | ICD-10-CM | POA: Diagnosis not present

## 2023-01-01 DIAGNOSIS — E669 Obesity, unspecified: Secondary | ICD-10-CM | POA: Diagnosis present

## 2023-01-01 DIAGNOSIS — I480 Paroxysmal atrial fibrillation: Secondary | ICD-10-CM | POA: Diagnosis present

## 2023-01-01 DIAGNOSIS — I5033 Acute on chronic diastolic (congestive) heart failure: Secondary | ICD-10-CM | POA: Diagnosis not present

## 2023-01-01 DIAGNOSIS — I509 Heart failure, unspecified: Secondary | ICD-10-CM | POA: Diagnosis not present

## 2023-01-01 DIAGNOSIS — S98912A Complete traumatic amputation of left foot, level unspecified, initial encounter: Secondary | ICD-10-CM | POA: Diagnosis not present

## 2023-01-01 DIAGNOSIS — J811 Chronic pulmonary edema: Secondary | ICD-10-CM | POA: Diagnosis not present

## 2023-01-01 DIAGNOSIS — J90 Pleural effusion, not elsewhere classified: Secondary | ICD-10-CM | POA: Diagnosis not present

## 2023-01-01 DIAGNOSIS — I70262 Atherosclerosis of native arteries of extremities with gangrene, left leg: Secondary | ICD-10-CM | POA: Diagnosis not present

## 2023-01-01 DIAGNOSIS — Z794 Long term (current) use of insulin: Secondary | ICD-10-CM | POA: Diagnosis not present

## 2023-01-01 DIAGNOSIS — C9201 Acute myeloblastic leukemia, in remission: Secondary | ICD-10-CM | POA: Diagnosis present

## 2023-01-01 DIAGNOSIS — Z7401 Bed confinement status: Secondary | ICD-10-CM | POA: Diagnosis not present

## 2023-01-01 DIAGNOSIS — E1152 Type 2 diabetes mellitus with diabetic peripheral angiopathy with gangrene: Secondary | ICD-10-CM | POA: Diagnosis present

## 2023-01-01 DIAGNOSIS — I2510 Atherosclerotic heart disease of native coronary artery without angina pectoris: Secondary | ICD-10-CM | POA: Diagnosis not present

## 2023-01-01 DIAGNOSIS — S98112A Complete traumatic amputation of left great toe, initial encounter: Secondary | ICD-10-CM | POA: Diagnosis not present

## 2023-01-01 DIAGNOSIS — E1122 Type 2 diabetes mellitus with diabetic chronic kidney disease: Secondary | ICD-10-CM | POA: Diagnosis not present

## 2023-01-01 DIAGNOSIS — M7989 Other specified soft tissue disorders: Secondary | ICD-10-CM | POA: Diagnosis not present

## 2023-01-01 DIAGNOSIS — R5383 Other fatigue: Secondary | ICD-10-CM | POA: Diagnosis not present

## 2023-01-01 DIAGNOSIS — Z9889 Other specified postprocedural states: Secondary | ICD-10-CM | POA: Diagnosis not present

## 2023-01-01 DIAGNOSIS — M25552 Pain in left hip: Secondary | ICD-10-CM | POA: Diagnosis not present

## 2023-01-01 DIAGNOSIS — J9811 Atelectasis: Secondary | ICD-10-CM | POA: Diagnosis not present

## 2023-01-01 DIAGNOSIS — I739 Peripheral vascular disease, unspecified: Secondary | ICD-10-CM | POA: Diagnosis not present

## 2023-01-01 DIAGNOSIS — Z87891 Personal history of nicotine dependence: Secondary | ICD-10-CM | POA: Diagnosis not present

## 2023-01-01 DIAGNOSIS — Z515 Encounter for palliative care: Secondary | ICD-10-CM | POA: Diagnosis not present

## 2023-01-01 DIAGNOSIS — E1165 Type 2 diabetes mellitus with hyperglycemia: Secondary | ICD-10-CM | POA: Diagnosis present

## 2023-01-01 DIAGNOSIS — J9621 Acute and chronic respiratory failure with hypoxia: Secondary | ICD-10-CM

## 2023-01-01 DIAGNOSIS — D631 Anemia in chronic kidney disease: Secondary | ICD-10-CM | POA: Diagnosis not present

## 2023-01-01 DIAGNOSIS — Z79811 Long term (current) use of aromatase inhibitors: Secondary | ICD-10-CM | POA: Diagnosis not present

## 2023-01-01 DIAGNOSIS — N184 Chronic kidney disease, stage 4 (severe): Secondary | ICD-10-CM | POA: Diagnosis present

## 2023-01-01 DIAGNOSIS — J449 Chronic obstructive pulmonary disease, unspecified: Secondary | ICD-10-CM | POA: Diagnosis present

## 2023-01-01 DIAGNOSIS — E440 Moderate protein-calorie malnutrition: Secondary | ICD-10-CM | POA: Diagnosis present

## 2023-01-01 DIAGNOSIS — Z1152 Encounter for screening for COVID-19: Secondary | ICD-10-CM | POA: Diagnosis not present

## 2023-01-01 DIAGNOSIS — E11649 Type 2 diabetes mellitus with hypoglycemia without coma: Secondary | ICD-10-CM | POA: Diagnosis not present

## 2023-01-01 DIAGNOSIS — E785 Hyperlipidemia, unspecified: Secondary | ICD-10-CM | POA: Diagnosis present

## 2023-01-01 DIAGNOSIS — G40909 Epilepsy, unspecified, not intractable, without status epilepticus: Secondary | ICD-10-CM | POA: Diagnosis present

## 2023-01-01 LAB — RESP PANEL BY RT-PCR (RSV, FLU A&B, COVID)  RVPGX2
Influenza A by PCR: NEGATIVE
Influenza B by PCR: NEGATIVE
Resp Syncytial Virus by PCR: NEGATIVE
SARS Coronavirus 2 by RT PCR: NEGATIVE

## 2023-01-01 LAB — LACTIC ACID, PLASMA: Lactic Acid, Venous: 0.7 mmol/L (ref 0.5–1.9)

## 2023-01-01 LAB — CBG MONITORING, ED
Glucose-Capillary: 238 mg/dL — ABNORMAL HIGH (ref 70–99)
Glucose-Capillary: 237 mg/dL — ABNORMAL HIGH (ref 70–99)

## 2023-01-01 LAB — GLUCOSE, CAPILLARY
Glucose-Capillary: 194 mg/dL — ABNORMAL HIGH (ref 70–99)
Glucose-Capillary: 307 mg/dL — ABNORMAL HIGH (ref 70–99)

## 2023-01-01 LAB — BRAIN NATRIURETIC PEPTIDE: B Natriuretic Peptide: 881.2 pg/mL — ABNORMAL HIGH (ref 0.0–100.0)

## 2023-01-01 MED ORDER — METHYLPREDNISOLONE SODIUM SUCC 125 MG IJ SOLR
125.0000 mg | Freq: Once | INTRAMUSCULAR | Status: AC
  Administered 2023-01-01: 125 mg via INTRAVENOUS
  Filled 2023-01-01: qty 2

## 2023-01-01 MED ORDER — APIXABAN 2.5 MG PO TABS
2.5000 mg | ORAL_TABLET | Freq: Two times a day (BID) | ORAL | Status: DC
  Administered 2023-01-01: 2.5 mg via ORAL
  Filled 2023-01-01: qty 1

## 2023-01-01 MED ORDER — FLUTICASONE FUROATE-VILANTEROL 200-25 MCG/ACT IN AEPB
1.0000 | INHALATION_SPRAY | Freq: Every day | RESPIRATORY_TRACT | Status: DC
  Filled 2023-01-01: qty 28

## 2023-01-01 MED ORDER — CALCIUM CARBONATE 1250 (500 CA) MG PO TABS
1250.0000 mg | ORAL_TABLET | Freq: Two times a day (BID) | ORAL | Status: DC
  Administered 2023-01-01: 1250 mg via ORAL
  Filled 2023-01-01: qty 1

## 2023-01-01 MED ORDER — LEVETIRACETAM 500 MG PO TABS
500.0000 mg | ORAL_TABLET | Freq: Two times a day (BID) | ORAL | Status: DC
  Administered 2023-01-01 – 2023-01-09 (×16): 500 mg via ORAL
  Filled 2023-01-01 (×16): qty 1

## 2023-01-01 MED ORDER — ALBUTEROL SULFATE (2.5 MG/3ML) 0.083% IN NEBU
2.5000 mg | INHALATION_SOLUTION | RESPIRATORY_TRACT | Status: DC | PRN
  Filled 2023-01-01: qty 3

## 2023-01-01 MED ORDER — POLYETHYLENE GLYCOL 3350 17 G PO PACK
17.0000 g | PACK | Freq: Every day | ORAL | Status: DC
  Administered 2023-01-01 – 2023-01-09 (×4): 17 g via ORAL
  Filled 2023-01-01 (×6): qty 1

## 2023-01-01 MED ORDER — SODIUM CHLORIDE 0.9% FLUSH: 3.0000 mL | INTRAVENOUS | Status: DC | PRN

## 2023-01-01 MED ORDER — METOPROLOL SUCCINATE ER 25 MG PO TB24
12.5000 mg | ORAL_TABLET | Freq: Every day | ORAL | Status: DC
  Administered 2023-01-01 – 2023-01-09 (×9): 12.5 mg via ORAL
  Filled 2023-01-01 (×9): qty 1

## 2023-01-01 MED ORDER — ACETAMINOPHEN 650 MG RE SUPP: 650.0000 mg | Freq: Four times a day (QID) | RECTAL | Status: DC | PRN

## 2023-01-01 MED ORDER — SODIUM CHLORIDE 0.9 % IV SOLN: 250.0000 mL | INTRAVENOUS | Status: DC | PRN

## 2023-01-01 MED ORDER — SODIUM CHLORIDE 0.9% FLUSH: 10.0000 mL | INTRAVENOUS | Status: DC | PRN

## 2023-01-01 MED ORDER — ACETAMINOPHEN 500 MG PO TABS
1000.0000 mg | ORAL_TABLET | Freq: Once | ORAL | Status: AC
  Administered 2023-01-01: 1000 mg via ORAL
  Filled 2023-01-01: qty 2

## 2023-01-01 MED ORDER — MELATONIN 3 MG PO TABS
3.0000 mg | ORAL_TABLET | Freq: Every day | ORAL | Status: DC
  Administered 2023-01-01 – 2023-01-08 (×8): 3 mg via ORAL
  Filled 2023-01-01 (×8): qty 1

## 2023-01-01 MED ORDER — VITAMIN C 500 MG PO TABS
1000.0000 mg | ORAL_TABLET | Freq: Every day | ORAL | Status: DC
  Administered 2023-01-02 – 2023-01-09 (×7): 1000 mg via ORAL
  Filled 2023-01-01 (×7): qty 2

## 2023-01-01 MED ORDER — ONDANSETRON HCL 4 MG/2ML IJ SOLN: 4.0000 mg | Freq: Four times a day (QID) | INTRAMUSCULAR | Status: DC | PRN

## 2023-01-01 MED ORDER — ACETAMINOPHEN 325 MG PO TABS
650.0000 mg | ORAL_TABLET | Freq: Four times a day (QID) | ORAL | Status: DC | PRN
  Administered 2023-01-03 – 2023-01-09 (×4): 650 mg via ORAL
  Filled 2023-01-01 (×5): qty 2

## 2023-01-01 MED ORDER — CALCIUM GLUCONATE-NACL 1-0.675 GM/50ML-% IV SOLN
1.0000 g | Freq: Once | INTRAVENOUS | Status: AC
  Administered 2023-01-01: 1000 mg via INTRAVENOUS
  Filled 2023-01-01: qty 50

## 2023-01-01 MED ORDER — FUROSEMIDE 10 MG/ML IJ SOLN
80.0000 mg | Freq: Two times a day (BID) | INTRAMUSCULAR | Status: DC
  Administered 2023-01-01 – 2023-01-04 (×6): 80 mg via INTRAVENOUS
  Filled 2023-01-01 (×6): qty 8

## 2023-01-01 MED ORDER — ISOSORBIDE MONONITRATE ER 30 MG PO TB24
30.0000 mg | ORAL_TABLET | Freq: Every day | ORAL | Status: DC
  Administered 2023-01-02 – 2023-01-09 (×7): 30 mg via ORAL
  Filled 2023-01-01 (×7): qty 1

## 2023-01-01 MED ORDER — SODIUM CHLORIDE 0.9% FLUSH
3.0000 mL | Freq: Two times a day (BID) | INTRAVENOUS | Status: DC
  Administered 2023-01-01 – 2023-01-03 (×5): 3 mL via INTRAVENOUS

## 2023-01-01 MED ORDER — HEPARIN (PORCINE) 25000 UT/250ML-% IV SOLN
800.0000 [IU]/h | INTRAVENOUS | Status: AC
  Administered 2023-01-01: 900 [IU]/h via INTRAVENOUS
  Filled 2023-01-01: qty 250

## 2023-01-01 MED ORDER — FLUTICASONE FUROATE-VILANTEROL 200-25 MCG/ACT IN AEPB
1.0000 | INHALATION_SPRAY | Freq: Every day | RESPIRATORY_TRACT | Status: DC
  Administered 2023-01-02 – 2023-01-09 (×8): 1 via RESPIRATORY_TRACT
  Filled 2023-01-01: qty 28

## 2023-01-01 MED ORDER — CALCIUM ACETATE (PHOS BINDER) 667 MG PO CAPS
1334.0000 mg | ORAL_CAPSULE | Freq: Three times a day (TID) | ORAL | Status: DC
  Administered 2023-01-01 – 2023-01-09 (×22): 1334 mg via ORAL
  Filled 2023-01-01 (×22): qty 2

## 2023-01-01 MED ORDER — CLOPIDOGREL BISULFATE 75 MG PO TABS
75.0000 mg | ORAL_TABLET | Freq: Every evening | ORAL | Status: DC
  Administered 2023-01-01 – 2023-01-08 (×8): 75 mg via ORAL
  Filled 2023-01-01 (×8): qty 1

## 2023-01-01 MED ORDER — POTASSIUM CHLORIDE CRYS ER 20 MEQ PO TBCR
20.0000 meq | EXTENDED_RELEASE_TABLET | Freq: Every day | ORAL | Status: DC
  Administered 2023-01-01 – 2023-01-04 (×3): 20 meq via ORAL
  Filled 2023-01-01 (×4): qty 2
  Filled 2023-01-01: qty 1
  Filled 2023-01-01: qty 2

## 2023-01-01 MED ORDER — ATORVASTATIN CALCIUM 40 MG PO TABS
40.0000 mg | ORAL_TABLET | Freq: Every day | ORAL | Status: DC
  Administered 2023-01-01 – 2023-01-09 (×8): 40 mg via ORAL
  Filled 2023-01-01 (×8): qty 1

## 2023-01-01 MED ORDER — CALCITRIOL 0.25 MCG PO CAPS
0.2500 ug | ORAL_CAPSULE | Freq: Every day | ORAL | Status: DC
  Administered 2023-01-01: 0.25 ug via ORAL
  Filled 2023-01-01: qty 1

## 2023-01-01 MED ORDER — NITROGLYCERIN 0.4 MG SL SUBL: 0.4000 mg | SUBLINGUAL_TABLET | SUBLINGUAL | Status: DC | PRN

## 2023-01-01 MED ORDER — INSULIN ASPART 100 UNIT/ML IJ SOLN
0.0000 [IU] | Freq: Three times a day (TID) | INTRAMUSCULAR | Status: DC
  Administered 2023-01-01 (×2): 3 [IU] via SUBCUTANEOUS
  Administered 2023-01-01: 2 [IU] via SUBCUTANEOUS
  Administered 2023-01-02: 3 [IU] via SUBCUTANEOUS
  Administered 2023-01-02: 5 [IU] via SUBCUTANEOUS

## 2023-01-01 MED ORDER — IPRATROPIUM-ALBUTEROL 0.5-2.5 (3) MG/3ML IN SOLN
3.0000 mL | Freq: Once | RESPIRATORY_TRACT | Status: AC
  Administered 2023-01-01: 3 mL via RESPIRATORY_TRACT
  Filled 2023-01-01: qty 3

## 2023-01-01 MED ORDER — HYDRALAZINE HCL 20 MG/ML IJ SOLN: 10.0000 mg | Freq: Three times a day (TID) | INTRAMUSCULAR | Status: DC | PRN

## 2023-01-01 MED ORDER — INSULIN GLARGINE-YFGN 100 UNIT/ML ~~LOC~~ SOLN
16.0000 [IU] | Freq: Every day | SUBCUTANEOUS | Status: DC
  Administered 2023-01-01: 16 [IU] via SUBCUTANEOUS
  Filled 2023-01-01 (×2): qty 0.16

## 2023-01-01 MED ORDER — OXYCODONE-ACETAMINOPHEN 5-325 MG PO TABS
1.0000 | ORAL_TABLET | Freq: Four times a day (QID) | ORAL | Status: DC | PRN
  Administered 2023-01-01: 2 via ORAL
  Administered 2023-01-02 (×2): 1 via ORAL
  Administered 2023-01-03: 2 via ORAL
  Administered 2023-01-03 – 2023-01-04 (×3): 1 via ORAL
  Filled 2023-01-01 (×2): qty 1
  Filled 2023-01-01: qty 2
  Filled 2023-01-01: qty 1
  Filled 2023-01-01: qty 2
  Filled 2023-01-01 (×4): qty 1

## 2023-01-01 MED ORDER — FUROSEMIDE 10 MG/ML IJ SOLN
40.0000 mg | Freq: Once | INTRAMUSCULAR | Status: AC
  Administered 2023-01-01: 40 mg via INTRAVENOUS
  Filled 2023-01-01: qty 4

## 2023-01-01 MED ORDER — INSULIN GLARGINE (2 UNIT DIAL) 300 UNIT/ML ~~LOC~~ SOPN: 6.0000 [IU] | PEN_INJECTOR | Freq: Every day | SUBCUTANEOUS | Status: DC

## 2023-01-01 MED ORDER — ONDANSETRON HCL 4 MG PO TABS: 4.0000 mg | ORAL_TABLET | Freq: Four times a day (QID) | ORAL | Status: DC | PRN

## 2023-01-01 NOTE — ED Notes (Signed)
Pt placed on bedpan

## 2023-01-01 NOTE — Telephone Encounter (Signed)
[facility] is calling and need VO for OT 1x3 , skip a week, 1x1 skip a week and then 1x1 for CHF education, functional mobility, dm foot instructions , adl's, transfer health promotion and safety

## 2023-01-01 NOTE — ED Provider Notes (Addendum)
[facility] Provider Note   CSN: [id number] Arrival date & time: 12/31/22  1532     History  Chief Complaint  Patient presents with   Leg Swelling    Left     [name] is a 82 y.o. female.  HPI     This is an 82 year old female with recent history of hospitalization for heart failure and lower extremity edema.  Additionally was noted to have significant peripheral vascular disease.  While in the hospital she had an aortogram and angioplasty.  Patient returns today after seeing her primary care physician yesterday.  Patient is generally a poor historian.  She is telling me that she has had lower extremity edema and a wound on her left toe.  She states it has been black for approximately 1 week.  I have reviewed her primary physician note.  It appears that she presented to the office today and was in some respiratory distress with concern for acute on chronic heart failure given edema and respiratory presentation.  Patient denies any recent fevers.  No coughs.  She states she has been taking medications as prescribed.  I reviewed her recent admission.  She was admitted for heart failure.  In addition she was evaluated by vascular surgery and had an aortogram of the lower extremities.  She does have significant peripheral vascular disease.  She had sustained a wound to the left great toe after dropping a bag of pennies on it.  I have reviewed the pictures and images of that wound.  Home Medications Prior to Admission medications   Medication Sig Start Date End Date Taking? Authorizing Provider  apixaban (ELIQUIS) 2.5 MG TABS tablet Take 1 tablet (2.5 mg total) by mouth 2 (two) times daily. 09/30/22   Angiulli, Lavon Paganini, PA-C  ascorbic acid (VITAMIN C) 1000 MG tablet Take 1 tablet (1,000 mg total) by mouth daily. 09/30/22   Angiulli, Lavon Paganini, PA-C  atorvastatin (LIPITOR) 40 MG tablet Take 1 tablet (40 mg total) by mouth daily. 09/30/22    Angiulli, Lavon Paganini, PA-C  calcium acetate (PHOSLO) 667 MG capsule Take 2 capsules (1,334 mg total) by mouth 3 (three) times daily with meals. 12/25/22   Bonnielee Haff, MD  clopidogrel (PLAVIX) 75 MG tablet Take 1 tablet (75 mg total) by mouth every evening. 09/30/22   Angiulli, Lavon Paganini, PA-C  cyanocobalamin (VITAMIN B12) 1000 MCG/ML injection INJECT 1ML IN THE MUSCLE EVERY 30 DAYS AS DIRECTED Patient taking differently: Inject 1,000 mcg into the muscle every 30 (thirty) days. 08/28/22   Isaac Bliss, Rayford Halsted, MD  docusate sodium (COLACE) 100 MG capsule Take 1 capsule (100 mg total) by mouth 2 (two) times daily. 09/30/22   Angiulli, Lavon Paganini, PA-C  fluticasone furoate-vilanterol (BREO ELLIPTA) 200-25 MCG/ACT AEPB Inhale 1 puff into the lungs as needed (wheezing SOB). 09/30/22   Angiulli, Lavon Paganini, PA-C  furosemide (LASIX) 40 MG tablet Take 40 mg by mouth daily.    [provider]  insulin glargine, 2 Unit Dial, (TOUJEO MAX SOLOSTAR) 300 UNIT/ML Solostar Pen Inject 6 Units into the skin at bedtime. Patient taking differently: Inject 10-16 Units into the skin See admin instructions. Inject 10 units into the skin at supper and 16 units at bedtime 09/05/22   Florencia Reasons, MD  isosorbide mononitrate (IMDUR) 30 MG 24 hr tablet Take 1 tablet (30 mg total) by mouth daily. 09/30/22   Angiulli, Lavon Paganini, PA-C  letrozole [facility]) 2.5 MG tablet  Take 1 tablet (2.5 mg total) by mouth daily. 02/14/22   Owens Shark, NP  levETIRAcetam (KEPPRA) 500 MG tablet TAKE 1 TABLET(500 MG) BY MOUTH TWICE DAILY 12/02/22   Raulkar, Clide Deutscher, MD  LORazepam (ATIVAN) 0.5 MG tablet Take 1 tablet (0.5 mg total) by mouth every 6 (six) hours as needed for anxiety. 09/30/22   Angiulli, Lavon Paganini, PA-C  melatonin 3 MG TABS tablet Take 1 tablet (3 mg total) by mouth at bedtime. Patient taking differently: Take 3 mg by mouth at bedtime. As needed 09/30/22   Angiulli, Lavon Paganini, PA-C  methocarbamol (ROBAXIN) 500 MG tablet Take 1  tablet (500 mg total) by mouth every 4 (four) hours as needed for muscle spasms. 09/30/22   Angiulli, Lavon Paganini, PA-C  metoprolol succinate (TOPROL-XL) 25 MG 24 hr tablet Take 0.5 tablets (12.5 mg total) by mouth daily. Take with or immediately following a meal. 09/30/22   Angiulli, Lavon Paganini, PA-C  Multiple Vitamin (MULTIVITAMIN WITH MINERALS) TABS tablet Take 1 tablet by mouth daily. 09/30/22   Angiulli, Lavon Paganini, PA-C  nitroGLYCERIN (NITROSTAT) 0.4 MG SL tablet Place 0.4 mg under the tongue every 5 (five) minutes as needed for chest pain. 11/08/22   [provider]  oxyCODONE-acetaminophen (PERCOCET/ROXICET) 5-325 MG tablet Take 1-2 tablets by mouth every 6 (six) hours as needed for severe pain. 12/25/22   Bonnielee Haff, MD  polyethylene glycol (MIRALAX / GLYCOLAX) 17 g packet Take 17 g by mouth daily. 09/30/22   Angiulli, Lavon Paganini, PA-C  potassium chloride (KLOR-CON) 10 MEQ tablet Take 10 mEq by mouth daily. 11/21/22   [provider]  psyllium (HYDROCIL/METAMUCIL) 95 % PACK Take 1 packet by mouth daily. 09/30/22   Angiulli, Lavon Paganini, PA-C  Vitamin D, Ergocalciferol, (DRISDOL) 1.25 MG (50000 UNIT) CAPS capsule Take 1 capsule (50,000 Units total) by mouth every 7 (seven) days. 12/31/22   Bonnielee Haff, MD      Allergies    Chlorhexidine gluconate    Review of Systems   Review of Systems  Constitutional:  Negative for fever.  Respiratory:  Positive for shortness of breath.   Cardiovascular:  Positive for leg swelling.  Skin:  Positive for color change.  All other systems reviewed and are negative.   Physical Exam Updated Vital Signs BP 131/65   Pulse 95   Temp 98 F (36.7 C)   Resp 19   Ht 1.575 m ('5\' 2"'$ )   Wt 70.3 kg   SpO2 99%   BMI 28.35 kg/m  Physical Exam Vitals and nursing note reviewed.  Constitutional:      Appearance: She is well-developed.     Comments: Chronically ill-appearing, obese  HENT:     Head: Normocephalic and atraumatic.      Mouth/Throat:     Mouth: Mucous membranes are moist.  Eyes:     Pupils: Pupils are equal, round, and reactive to light.  Cardiovascular:     Rate and Rhythm: Normal rate and regular rhythm.     Heart sounds: Normal heart sounds.  Pulmonary:     Effort: Pulmonary effort is normal. No respiratory distress.     Breath sounds: Rhonchi present. No wheezing.     Comments: No overt respiratory distress, speaking in full sentences Abdominal:     Palpations: Abdomen is soft.     Tenderness: There is no abdominal tenderness.  Musculoskeletal:     Cervical back: Neck supple.  Skin:    General: Skin is warm and dry.  Comments: Bilateral feet cool but perfused, gangrenous left great toe, dopplerable posterior tibial pulses bilaterally  Neurological:     Mental Status: She is alert and oriented to person, place, and time.  Psychiatric:        Mood and Affect: Mood normal.     ED Results / Procedures / Treatments   Labs (all labs ordered are listed, but only abnormal results are displayed) Labs Reviewed  BASIC METABOLIC PANEL - Abnormal; Notable for the following components:      Result Value   Glucose, Bld 244 (*)    BUN 85 (*)    Creatinine, Ser 2.38 (*)    Calcium 6.2 (*)    GFR, Estimated 20 (*)    All other components within normal limits  CBC WITH DIFFERENTIAL/PLATELET - Abnormal; Notable for the following components:   RBC 3.09 (*)    Hemoglobin 9.6 (*)    HCT 30.8 (*)    RDW 17.2 (*)    All other components within normal limits  BRAIN NATRIURETIC PEPTIDE - Abnormal; Notable for the following components:   B Natriuretic Peptide 881.2 (*)    All other components within normal limits  CULTURE, BLOOD (ROUTINE X 2)  RESP PANEL BY RT-PCR (RSV, FLU A&B, COVID)  [id number]  CULTURE, BLOOD (ROUTINE X 2)  LACTIC ACID, PLASMA  LACTIC ACID, PLASMA  URINALYSIS, ROUTINE W REFLEX MICROSCOPIC    EKG EKG Interpretation  Date/Time:  Thursday January 01 2023 01:32:53 EST Ventricular  Rate:  86 PR Interval:    QRS Duration: 84 QT Interval:  432 QTC Calculation: 517 R Axis:   119 Text Interpretation: Atrial fibrillation Probable anterolateral infarct, age indeterm Prolonged QT interval Baseline wander in lead(s) V4 Confirmed by Thayer Jew [id number]) on 01/01/2023 2:22:33 AM  Radiology DG Chest Portable 1 View  Result Date: 01/01/2023 CLINICAL DATA:  Shortness of breath and leg swelling. EXAM: PORTABLE CHEST 1 VIEW COMPARISON:  December 24, 2022 FINDINGS: There is stable right-sided venous Port-A-Cath positioning. The cardiac silhouette is mildly enlarged and unchanged in size. There is marked severity calcification of the aortic arch. Mild prominence of the bilateral perihilar pulmonary vasculature is noted. Mild atelectasis is seen within the bilateral lung bases. Small bilateral pleural effusions are seen. No pneumothorax is identified. Radiopaque surgical clips are seen in the soft tissues of the left breast. The visualized skeletal structures are unremarkable. IMPRESSION: 1. Stable cardiomegaly with mild pulmonary vascular congestion. 2. Mild bibasilar atelectasis and small bilateral pleural effusions. Electronically Signed   By: Virgina Norfolk M.D.   On: 01/01/2023 01:25   DG Foot Complete Left  Result Date: 12/31/2022 CLINICAL DATA:  Worsening left foot wounds. EXAM: LEFT FOOT - COMPLETE 3+ VIEW COMPARISON:  None Available. FINDINGS: There is no evidence of an acute fracture or dislocation. Diffuse osteopenia is noted without areas of acute cortical destruction. There is a moderate to large plantar calcaneal spur with mild degenerative changes seen within the mid left foot. Moderate severity diffuse soft tissue swelling is seen, more prominent along the dorsal aspect of the left foot. IMPRESSION: Moderate severity diffuse soft tissue swelling without evidence of acute osteomyelitis. MRI correlation is recommended if acute osteomyelitis remains of clinical concern.  Electronically Signed   By: Virgina Norfolk M.D.   On: 12/31/2022 19:16    Procedures .Critical Care  Performed by: Merryl Hacker, MD Authorized by: Merryl Hacker, MD   Critical care provider statement:    Critical care time (minutes):  50  Critical care was necessary to treat or prevent imminent or life-threatening deterioration of the following conditions:  Respiratory failure   Critical care was time spent personally by me on the following activities:  Development of treatment plan with patient or surrogate, discussions with consultants, evaluation of patient's response to treatment, examination of patient, ordering and review of laboratory studies, ordering and review of radiographic studies, ordering and performing treatments and interventions, pulse oximetry, re-evaluation of patient's condition and review of old charts     Medications Ordered in ED Medications  sodium chloride flush (NS) 0.9 % injection 10-40 mL (has no administration in time range)  ipratropium-albuterol (DUONEB) 0.5-2.5 (3) MG/3ML nebulizer solution 3 mL (3 mLs Nebulization Given 01/01/23 0425)  methylPREDNISolone sodium succinate (SOLU-MEDROL) 125 mg/2 mL injection 125 mg (125 mg Intravenous Given 01/01/23 0427)  acetaminophen (TYLENOL) tablet 1,000 mg (1,000 mg Oral Given 01/01/23 0424)  furosemide (LASIX) injection 40 mg (40 mg Intravenous Given 01/01/23 0604)    ED Course/ Medical Decision Making/ A&P Clinical Course as of 01/01/23 0729  Thu Jan 01, 2023  0247 Patient now with worsening tachypnea.  On repeat exam, she now has active wheezing.  History of COPD as well as CHF.  She was placed on 2 L of nasal cannula for O2 sats of 90%.  Given a DuoNeb and Solu-Medrol. [CH]  [id number] Patient clinically improved after DuoNeb.  She seems more comfortable.  There has been delay in her lab work.  Still awaiting COVID and flu testing and BNP.  Likely will be admitted regardless. [CH]  [id number] Spoke with Dr.  Trula Slade, vascular.  Patient will be evaluated. [CH]    Clinical Course User Index [CH] Ajane Novella, Barbette Hair, MD                             Medical Decision Making Amount and/or Complexity of Data Reviewed Labs: ordered. Radiology: ordered.  Risk OTC drugs. Prescription drug management. Decision regarding hospitalization.   This patient presents to the ED for concern of shortness of breath, toe infection, this involves an extensive number of treatment options, and is a complaint that carries with it a high risk of complications and morbidity.  I considered the following differential and admission for this acute, potentially life threatening condition.  The differential diagnosis includes respiratory failure secondary to COPD, CHF, pneumonia, viral infection.  Regarding left great toe, ongoing vascular compromise, acute infection, gangrene  MDM:    This is an 82 year old female who presents with concerns for shortness of breath.  Also complains of worsening discoloration of the left great toe.  Was seen at her primary care office yesterday with concerns for respiratory distress.  Initially did not appear in significant distress but progressively got more tachypneic.  O2 sats 90% on room air.  She was started on 2 L.  She was wheezing.  She was given a DuoNeb and Solu-Medrol.  She does look somewhat volume overloaded with lower extremity edema.  Left great toe when compared to pictures in the chart from her recent hospitalization and appears gangrenous.  It appears to be dry gangrene.  She does have dopplerable posterior tibial pulses.  She recently had vascular procedure for revascularization of that left lower extremity.  Lactate is normal.  Stable elevated creatinine.  No white count.  I suspect that her left great toe may need to be amputated although it does not appear acutely infected or causing her acute presentation  of shortness of breath.  Shortness of breath is much more likely related to  COPD and/or CHF.  BNP remains elevated.  She was diuresed while she was in the hospital earlier this month.  She was given a dose of Lasix.  She was also given a DuoNeb and had some improvement of her symptoms.  Viral testing is pending.  Regardless she will need admission.  Will consult with vascular surgery regarding her gangrenous left great toe.  Otherwise medicine admit for ongoing respiratory support, diuresis.  (Labs, imaging, consults)  Labs: I Ordered, and personally interpreted labs.  The pertinent results include: CBC, BMP, BNP, cultures, lactate  Imaging Studies ordered: I ordered imaging studies including chest x-ray with mild pulmonary vascular congestion and small bilateral pleural effusions, left foot x-ray without osteo I independently visualized and interpreted imaging. I agree with the radiologist interpretation  Additional history obtained from chart review.  External records from outside source obtained and reviewed including discharge summary and vascular notes  Cardiac Monitoring: The patient was maintained on a cardiac monitor.  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   Acute pancreatitis    Arthritis    "back" (09/17/2017)   Atrial flutter ([place])    Cardiomyopathy ([place])    CHF (congestive heart failure) ([place])    Chronic kidney disease, stage II (mild)    Chronic lower back pain    Colon polyps 04/29/2010   TUBULAR ADENOMA AND A SERRATED ADENOMA   COPD (chronic obstructive pulmonary disease) ([place])    CORONARY ARTERY DISEASE 12/24/2007   DIABETES MELLITUS, TYPE  II 07/13/2007   History of blood transfusion 2015   "related to leukemia"   History of kidney stones 12/24/2007   History of uterine cancer    HYPERLIPIDEMIA 12/24/2007   HYPERTENSION 07/13/2007   NSTEMI (non-ST elevated myocardial infarction) ([place]) 09/17/2017   Uterine cancer ([place])      Medicines Meds ordered this encounter  Medications   sodium chloride flush (NS) 0.9 % injection 10-40 mL   ipratropium-albuterol (DUONEB) 0.5-2.5 (3) MG/3ML nebulizer solution 3 mL   methylPREDNISolone sodium succinate (SOLU-MEDROL) 125 mg/2 mL injection 125 mg    IV methylprednisolone will be converted to either a q12h or q24h frequency with the same total daily dose (TDD).  Ordered Dose: 1 to 125 mg TDD; convert to: TDD q24h.  Ordered Dose: 126 to 250 mg TDD; convert to: TDD div q12h.  Ordered Dose: >250 mg TDD; DAW.   acetaminophen (TYLENOL) tablet 1,000 mg   furosemide (LASIX) injection 40 mg    I have reviewed the patients home medicines and have made adjustments as needed  Problem List / ED Course: Problem List Items Addressed This Visit   None Visit Diagnoses     Acute on chronic respiratory failure with hypoxia ([place])    -  Primary   Acute on chronic congestive heart failure, unspecified heart failure type (H[facility])       Relevant Medications   furosemide (LASIX) injection 40 mg (Completed)   PVD (peripheral vascular disease) (HCC)       Relevant Medications   furosemide (LASIX) injection 40 mg (Completed)  Gangrene of toe of left foot ([place])                       Final Clinical Impression(s) / ED Diagnoses Final diagnoses:  Acute on chronic respiratory failure with hypoxia (HCC)  Acute on chronic congestive heart failure, unspecified heart failure type ([place])  PVD (peripheral vascular disease) ([place])  Gangrene of toe of left foot [facility])    Rx / DC Orders ED Discharge Orders     None         Jayanna Kroeger, Barbette Hair, MD 01/01/23 6803    Merryl Hacker,  MD 01/01/23 [phone]

## 2023-01-01 NOTE — ED Notes (Signed)
Provider at bedside

## 2023-01-01 NOTE — Plan of Care (Signed)
  Problem: Activity: Goal: Ability to return to baseline activity level will improve Outcome: Progressing   Problem: Activity: Goal: Capacity to carry out activities will improve Outcome: Progressing   Problem: Coping: Goal: Ability to adjust to condition or change in health will improve Outcome: Progressing

## 2023-01-01 NOTE — ED Notes (Signed)
Pt clean from being incontinent of stool - stool firm and formed

## 2023-01-01 NOTE — Consult Note (Addendum)
[facility] NOTE   MRN : [id number]  Reason for Consult: left GT dry gangrene Referring Physician: ED  History of Present Illness: 82 y/o female with increased ischemic changes toe the left GT.  She has a recent history of left GT non healing wound angiography and balloon angioplasty of the left SFA 12/24/22 by Dr. Virl Cagey.  The popliteal artery is widely patent, two-vessel, posterior tibial, peroneal runoff. The posterior tibial artery continues into the foot via plantar arteries.    Right leg with nearly-symmetric disease, no intervention.  No wound on the side.  No plan for intervention at this time. Today the left GT toe is ischemic and has dry gangrene.    She returned to [facility] ED with a CC: respiratory distress with concern for acute on chronic heart failure given edema and respiratory presentation.    Current Facility-Administered Medications  Medication Dose Route Frequency Provider Last Rate Last Admin   0.9 %  sodium chloride infusion  250 mL Intravenous PRN Ghimire, Henreitta Leber, MD       acetaminophen (TYLENOL) tablet 650 mg  650 mg Oral Q6H PRN Ghimire, Henreitta Leber, MD       Or   acetaminophen (TYLENOL) suppository 650 mg  650 mg Rectal Q6H PRN Ghimire, Henreitta Leber, MD       albuterol (PROVENTIL) (2.5 MG/3ML) 0.083% nebulizer solution 2.5 mg  2.5 mg Nebulization Q2H PRN Ghimire, Henreitta Leber, MD       apixaban (ELIQUIS) tablet 2.5 mg  2.5 mg Oral BID Ghimire, Henreitta Leber, MD       ascorbic acid (VITAMIN C) tablet 1,000 mg  1,000 mg Oral Daily Ghimire, Henreitta Leber, MD       atorvastatin (LIPITOR) tablet 40 mg  40 mg Oral Daily Ghimire, Henreitta Leber, MD       calcium acetate (PHOSLO) capsule 1,334 mg  1,334 mg Oral TID WC Ghimire, Henreitta Leber, MD       clopidogrel (PLAVIX) tablet 75 mg  75 mg Oral QPM Ghimire, Shanker M, MD       fluticasone furoate-vilanterol (BREO ELLIPTA) 200-25 MCG/ACT 1 puff  1 puff Inhalation PRN Ghimire, Henreitta Leber, MD       furosemide  (LASIX) injection 80 mg  80 mg Intravenous Q12H Ghimire, Henreitta Leber, MD       hydrALAZINE (APRESOLINE) injection 10 mg  10 mg Intravenous Q8H PRN Ghimire, Henreitta Leber, MD       insulin aspart (novoLOG) injection 0-9 Units  0-9 Units Subcutaneous TID WC Ghimire, Shanker M, MD       insulin glargine (2 Unit Dial) (TOUJEO MAX) Solostar Pen SOPN 6 Units  6 Units Subcutaneous QHS Ghimire, Henreitta Leber, MD       isosorbide mononitrate (IMDUR) 24 hr tablet 30 mg  30 mg Oral Daily Ghimire, Shanker M, MD       levETIRAcetam (KEPPRA) tablet 500 mg  500 mg Oral BID Ghimire, Henreitta Leber, MD       melatonin tablet 3 mg  3 mg Oral QHS Ghimire, Henreitta Leber, MD       metoprolol succinate (TOPROL-XL) 24 hr tablet 12.5 mg  12.5 mg Oral Daily Ghimire, Henreitta Leber, MD       nitroGLYCERIN (NITROSTAT) SL tablet 0.4 mg  0.4 mg Sublingual Q5 min PRN Jonetta Osgood, MD       ondansetron [facility]) tablet 4 mg  4 mg Oral Q6H PRN Ghimire, Henreitta Leber, MD  Or   ondansetron (ZOFRAN) injection 4 mg  4 mg Intravenous Q6H PRN Ghimire, Henreitta Leber, MD       oxyCODONE-acetaminophen (PERCOCET/ROXICET) 5-325 MG per tablet 1-2 tablet  1-2 tablet Oral Q6H PRN Ghimire, Henreitta Leber, MD       polyethylene glycol (MIRALAX / GLYCOLAX) packet 17 g  17 g Oral Daily Ghimire, Henreitta Leber, MD       potassium chloride (KLOR-CON) CR tablet 20 mEq  20 mEq Oral Daily Ghimire, Henreitta Leber, MD       sodium chloride flush (NS) 0.9 % injection 3 mL  3 mL Intravenous Q12H Ghimire, Henreitta Leber, MD       sodium chloride flush (NS) 0.9 % injection 3 mL  3 mL Intravenous PRN Ghimire, Henreitta Leber, MD       Current Outpatient Medications  Medication Sig Dispense Refill   apixaban (ELIQUIS) 2.5 MG TABS tablet Take 1 tablet (2.5 mg total) by mouth 2 (two) times daily. 60 tablet 0   ascorbic acid (VITAMIN C) 1000 MG tablet Take 1 tablet (1,000 mg total) by mouth daily. 30 tablet 0   atorvastatin (LIPITOR) 40 MG tablet Take 1 tablet (40 mg total) by mouth daily. 30 tablet 0    calcium acetate (PHOSLO) 667 MG capsule Take 2 capsules (1,334 mg total) by mouth 3 (three) times daily with meals. 180 capsule 2   clopidogrel (PLAVIX) 75 MG tablet Take 1 tablet (75 mg total) by mouth every evening. 30 tablet 0   cyanocobalamin (VITAMIN B12) 1000 MCG/ML injection INJECT 1ML IN THE MUSCLE EVERY 30 DAYS AS DIRECTED (Patient taking differently: Inject 1,000 mcg into the muscle every 30 (thirty) days.) 3 mL 3   docusate sodium (COLACE) 100 MG capsule Take 1 capsule (100 mg total) by mouth 2 (two) times daily. 10 capsule 0   fluticasone furoate-vilanterol (BREO ELLIPTA) 200-25 MCG/ACT AEPB Inhale 1 puff into the lungs as needed (wheezing SOB). 1 each 0   furosemide (LASIX) 40 MG tablet Take 40 mg by mouth daily.     insulin glargine, 2 Unit Dial, (TOUJEO MAX SOLOSTAR) 300 UNIT/ML Solostar Pen Inject 6 Units into the skin at bedtime. (Patient taking differently: Inject 10-16 Units into the skin See admin instructions. Inject 10 units into the skin at supper and 16 units at bedtime) 15 mL 0   isosorbide mononitrate (IMDUR) 30 MG 24 hr tablet Take 1 tablet (30 mg total) by mouth daily. 30 tablet 0   letrozole (FEMARA) 2.5 MG tablet Take 1 tablet (2.5 mg total) by mouth daily. 90 tablet 3   levETIRAcetam (KEPPRA) 500 MG tablet TAKE 1 TABLET(500 MG) BY MOUTH TWICE DAILY 180 tablet 0   LORazepam (ATIVAN) 0.5 MG tablet Take 1 tablet (0.5 mg total) by mouth every 6 (six) hours as needed for anxiety. 30 tablet 0   melatonin 3 MG TABS tablet Take 1 tablet (3 mg total) by mouth at bedtime. (Patient taking differently: Take 3 mg by mouth at bedtime. As needed) 30 tablet 0   methocarbamol (ROBAXIN) 500 MG tablet Take 1 tablet (500 mg total) by mouth every 4 (four) hours as needed for muscle spasms. 60 tablet 0   metoprolol succinate (TOPROL-XL) 25 MG 24 hr tablet Take 0.5 tablets (12.5 mg total) by mouth daily. Take with or immediately following a meal. 30 tablet 0   Multiple Vitamin (MULTIVITAMIN  WITH MINERALS) TABS tablet Take 1 tablet by mouth daily.     nitroGLYCERIN (NITROSTAT) 0.4 MG SL  tablet Place 0.4 mg under the tongue every 5 (five) minutes as needed for chest pain.     oxyCODONE-acetaminophen (PERCOCET/ROXICET) 5-325 MG tablet Take 1-2 tablets by mouth every 6 (six) hours as needed for severe pain. 30 tablet 0   polyethylene glycol (MIRALAX / GLYCOLAX) 17 g packet Take 17 g by mouth daily. 14 each 0   potassium chloride (KLOR-CON) 10 MEQ tablet Take 10 mEq by mouth daily.     psyllium (HYDROCIL/METAMUCIL) 95 % PACK Take 1 packet by mouth daily. 240 each    Vitamin D, Ergocalciferol, (DRISDOL) 1.25 MG (50000 UNIT) CAPS capsule Take 1 capsule (50,000 Units total) by mouth every 7 (seven) days. 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   Acute pancreatitis    Arthritis    "back" (09/17/2017)   Atrial flutter (HCC)    Cardiomyopathy ([place])    CHF (congestive heart failure) (HCC)    Chronic kidney disease, stage II (mild)    Chronic lower back pain    Colon polyps 04/29/2010   TUBULAR ADENOMA AND A SERRATED ADENOMA   COPD (chronic obstructive pulmonary disease) ([place] [address] [place])    CORONARY ARTERY DISEASE 12/24/2007   DIABETES MELLITUS, TYPE II 07/13/2007   History of blood transfusion 2015   "related to leukemia"   History of kidney stones 12/24/2007   History of uterine cancer    HYPERLIPIDEMIA 12/24/2007   HYPERTENSION 07/13/2007   NSTEMI (non-ST elevated myocardial infarction) ([place]) 09/17/2017   Uterine cancer ([place])     Past Surgical History:   Procedure Laterality Date   ABDOMINAL AORTOGRAM W/LOWER EXTREMITY Bilateral 12/24/2022   Procedure: ABDOMINAL AORTOGRAM W/LOWER EXTREMITY;  Surgeon: Broadus John, MD;  Location: [place] [facility];  Service: Cardiovascular;  Laterality: Bilateral;   ABDOMINAL HYSTERECTOMY     "still have my ovaries"   BREAST BIOPSY Left 09/22/2019   BREAST LUMPECTOMY Left 10/24/2019   BREAST LUMPECTOMY WITH RADIOACTIVE SEED LOCALIZATION Left 10/24/2019   Procedure: LEFT BREAST LUMPECTOMY WITH RADIOACTIVE SEED LOCALIZATION;  Surgeon: Jovita Kussmaul, MD;  Location: [place];  Service: General;  Laterality: Left;   CARDIAC CATHETERIZATION  2002   CARDIAC CATHETERIZATION  09/17/2017   CATARACT EXTRACTION W/ INTRAOCULAR LENS  IMPLANT, BILATERAL Bilateral    COLONOSCOPY W/ BIOPSIES AND POLYPECTOMY  2011   CORONARY STENT INTERVENTION N/A 09/21/2017   Procedure: CORONARY STENT INTERVENTION;  Surgeon: Troy Sine, MD;  Location: [place] [facility];  Service: Cardiovascular;  Laterality: N/A;   DILATION AND CURETTAGE OF UTERUS     ESOPHAGOGASTRODUODENOSCOPY N/A 01/30/2022   Procedure: ESOPHAGOGASTRODUODENOSCOPY (EGD);  Surgeon: Milus Banister, MD;  Location: [facility];  Service: Endoscopy;  Laterality: N/A;   EUS N/A 01/30/2022   Procedure: UPPER ENDOSCOPIC ULTRASOUND (EUS) RADIAL;  Surgeon: Milus Banister, MD;  Location: [facility];  Service: Endoscopy;  Laterality: N/A;   EXCISIONAL HEMORRHOIDECTOMY  X 2   FINE NEEDLE ASPIRATION N/A 01/30/2022   Procedure: FINE NEEDLE ASPIRATION (FNA) LINEAR;  Surgeon: Milus Banister, MD;  Location: [facility];  Service: Endoscopy;  Laterality: N/A;  GANGLION CYST EXCISION Left X 3   INTRAMEDULLARY (IM) NAIL INTERTROCHANTERIC Right 08/30/2022   Procedure: INTRAMEDULLARY (IM) NAIL INTERTROCHANTERIC;  Surgeon: Altamese Alger, MD;  Location: [place];  Service: Orthopedics;  Laterality: Right;   INTRAVASCULAR PRESSURE WIRE/FFR STUDY N/A 09/17/2017    Procedure: INTRAVASCULAR PRESSURE WIRE/FFR STUDY;  Surgeon: Nelva Bush, MD;  Location: [place] [facility];  Service: Cardiovascular;  Laterality: N/A;   IR THORACENTESIS ASP PLEURAL SPACE W/IMG GUIDE  10/27/2022   NASAL SINUS SURGERY     PERIPHERAL VASCULAR BALLOON ANGIOPLASTY  12/24/2022   Procedure: PERIPHERAL VASCULAR BALLOON ANGIOPLASTY;  Surgeon: Broadus John, MD;  Location: [place] [facility];  Service: Cardiovascular;;  Lt. SFA   PORTA CATH INSERTION Right 2013   RIGHT/LEFT HEART CATH AND CORONARY ANGIOGRAPHY N/A 09/17/2017   Procedure: RIGHT/LEFT HEART CATH AND CORONARY ANGIOGRAPHY;  Surgeon: Nelva Bush, MD;  Location: [place] [facility];  Service: Cardiovascular;  Laterality: N/A;   TEE WITHOUT CARDIOVERSION N/A 11/19/2022   Procedure: TRANSESOPHAGEAL ECHOCARDIOGRAM (TEE);  Surgeon: Hebert Soho, DO;  Location: [facility];  Service: Cardiovascular;  Laterality: N/A;   TUBAL LIGATION      Social History Social History   Tobacco Use   Smoking status: Former    Packs/day: 1.00    Years: 20.00    Total pack years: 20.00    Types: Cigarettes    Quit date: 12/08/2000    Years since quitting: 22.0   Smokeless tobacco: Never  Vaping Use   Vaping Use: Never used  Substance Use Topics   Alcohol use: No    Alcohol/week: 0.0 standard drinks of alcohol   Drug use: No    Family History Family History  Problem Relation Age of Onset   COPD Father    Esophageal cancer Father    Breast cancer Sister 76   Lung cancer Sister    Esophageal cancer Brother    Suicidality Brother    Lung cancer Sister    Lung cancer Sister    Cervical cancer Sister    Congestive Heart Failure Mother    Heart attack Maternal Grandmother    Skin cancer Maternal Grandfather    Heart attack Maternal Grandfather    Stroke Paternal Grandmother 35   Heart attack Paternal Grandfather    Cancer Maternal Uncle        unknown type, dx. >59   Cancer Paternal Aunt        unknown type,  dx. >50   Cancer Paternal Uncle        unknown type, dx. >50   Cirrhosis Maternal Uncle    Skin cancer Daughter    Leukemia Cousin        maternal first cousin; dx. >50, in remission   Cancer Niece        unknown type behind her ear, dx. 30s/40s   Colon cancer Neg Hx     Allergies  Allergen Reactions   Chlorhexidine Gluconate Itching and Rash     REVIEW OF SYSTEMS  General: '[ ]'$  Weight loss, '[ ]'$  Fever, '[ ]'$  chills Neurologic: '[ ]'$  Dizziness, '[ ]'$  Blackouts, '[ ]'$  Seizure '[ ]'$  Stroke, '[ ]'$  "Mini stroke", '[ ]'$  Slurred speech, '[ ]'$  Temporary blindness; '[ ]'$  weakness in arms or legs, '[ ]'$  Hoarseness '[ ]'$  Dysphagia Cardiac: '[ ]'$  Chest pain/pressure, [ x] Shortness of breath at rest '[ ]'$  Shortness of breath with exertion, [ x] Atrial fibrillation or irregular heartbeat  Vascular: '[ ]'$  Pain in legs with  walking, '[ ]'$  Pain in legs at rest, '[ ]'$  Pain in legs at night,  '[ ]'$  Non-healing ulcer, '[ ]'$  Blood clot in vein/DVT,   Pulmonary: '[ ]'$  Home oxygen, '[ ]'$  Productive cough, '[ ]'$  Coughing up blood, '[ ]'$  Asthma,  '[ ]'$  Wheezing '[ ]'$  COPD  Musculoskeletal:  '[ ]'$  Arthritis, '[ ]'$  Low back pain, '[ ]'$  Joint pain Hematologic: '[ ]'$  Easy Bruising, '[ ]'$  Anemia; '[ ]'$  Hepatitis Gastrointestinal: '[ ]'$  Blood in stool, '[ ]'$  Gastroesophageal Reflux/heartburn, Urinary: '[ ]'$  chronic Kidney disease, '[ ]'$  on HD - '[ ]'$  MWF or '[ ]'$  TTHS, '[ ]'$  Burning with urination, '[ ]'$  Difficulty urinating Skin: '[ ]'$  Rashes, '[ ]'$  Wounds Psychological: '[ ]'$  Anxiety, '[ ]'$  Depression  Physical Examination Vitals:   01/01/23 0600 01/01/23 0630 01/01/23 0700 01/01/23 0730  BP: (!) 115/52 125/71 131/65 (!) 135/57  Pulse: 100 (!) 119 95 99  Resp: 20 (!) '21 19 20  '$ Temp: 98 F (36.7 C)     TempSrc:      SpO2: 97% 99% 99% 99%  Weight:      Height:       Body mass index is 28.35 kg/m.  General:  WDWN in NAD HENT: WNL Eyes: Pupils equal Pulmonary: normal non-labored breathing , without Rales, rhonchi,  wheezing Cardiac: RRR, without  Murmurs, rubs or gallops; No  carotid bruits Abdomen: soft, NT, no masses Skin: no rashes, ulcers noted;  left GT Gangrene , no cellulitis; no open wounds;   Vascular Exam/Pulses:Doppler signals left PT/peroneal, right peroneal   Musculoskeletal: no muscle wasting or atrophy; positive B LE edema  Neurologic: A&O X 3; Appropriate Affect ;  SENSATION: normal; MOTOR FUNCTION: motor grossly intact Speech is fluent/normal   Significant Diagnostic Studies: CBC Lab Results  Component Value Date   WBC 5.8 12/31/2022   HGB 9.6 (L) 12/31/2022   HCT 30.8 (L) 12/31/2022   MCV 99.7 12/31/2022   PLT 156 12/31/2022    BMET    Component Value Date/Time   NA 140 12/31/2022 1805   NA 147 (H) 05/19/2022 1605   NA 144 09/04/2017 1436   K 4.0 12/31/2022 1805   K 4.1 09/04/2017 1436   CL 102 12/31/2022 1805   CO2 27 12/31/2022 1805   CO2 26 09/04/2017 1436   GLUCOSE 244 (H) 12/31/2022 1805   GLUCOSE 152 (H) 09/04/2017 1436   BUN 85 (H) 12/31/2022 1805   BUN 26 05/19/2022 1605   BUN 20.6 09/04/2017 1436   CREATININE 2.38 (H) 12/31/2022 1805   CREATININE 1.53 (H) 01/22/2022 1457   CREATININE 1.4 (H) 09/04/2017 1436   CALCIUM 6.2 (LL) 12/31/2022 1805   CALCIUM 9.4 09/04/2017 1436   GFRNONAA 20 (L) 12/31/2022 1805   GFRNONAA 34 (L) 01/22/2022 1457   GFRAA 39 (L) 10/04/2020 1404   GFRAA 39 (L) 03/29/2019 1459   Estimated Creatinine Clearance: 17 mL/min (A) (by C-G formula based on SCr of 2.38 mg/dL (H)).  COAG Lab Results  Component Value Date   INR 1.1 12/08/2021   INR 1.1 12/05/2021   INR 1.02 11/21/2017   PROTIME 13.2 02/29/2016     12/24/21 Recent angiogram: Findings: Severe atherosclerotic disease throughout the lower extremities Aortogram: Bilateral renal arteries patent, no flow-limiting stenosis in the aortoiliac segments bilaterally.   On the left: Patent common femoral artery, patent profunda.  70% stenosis at the ostia of the superficial femoral artery with a 3 cm 70% stenotic lesion in the  proximal superficial  femoral artery.  Diffuse atherosclerotic disease throughout the superficial femoral artery.  The popliteal artery is widely patent, two-vessel, posterior tibial, peroneal runoff.  The posterior tibial artery continues into the foot via plantar arteries.   On the right: Patent common femoral artery, patent profunda.  60% ostial stenosis at the superficial femoral artery.  Diffuse atherosclerotic disease throughout the superficial femoral artery.  The popliteal artery is widely patent, two-vessel runoff posterior tibial, peroneal artery.  The posterior tibial artery continues into the foot via the plantar arteries.             Procedure:  The patient was identified in the holding area and taken to room 8.  The patient was then placed supine on the table and prepped and draped in the usual sterile fashion.  A time out was called.  Ultrasound was used to evaluate the right common femoral artery.  It was patent .  A digital ultrasound image was acquired.  A micropuncture needle was used to access the right common femoral artery under ultrasound guidance.  An 018 wire was advanced without resistance and a micropuncture sheath was placed.  The 018 wire was removed and a benson wire was placed.  The micropuncture sheath was exchanged for a 5 french sheath.  An omniflush catheter was advanced over the wire to the level of L-1.  An abdominal angiogram was obtained.  Next, using the omniflush catheter and a benson wire, the aortic bifurcation was crossed and the catheter was placed into theleft external iliac artery and left runoff was obtained.  right runoff was performed via retrograde sheath injections.     I elected to attempt intervention on the left SFA ostial lesion, proximal SFA lesion.  A 6 x 45 cm sheath was brought to the field and parked in the left common femoral artery.  Next, a series of wires and catheters were used to traverse the superficial femoral artery.  Next, a 4 x 40 mm  angioplasty balloon was brought to the field and placed across the ostium of the superficial femoral artery.  This was held for 3 minutes.  Following this, the balloon was used on the proximal superficial femoral artery.  Again, the balloon was inflated for 3 minutes.   Follow-up angiography demonstrated significant improvement in the proximal SFA.  There appeared to be central stenosis at the ostia of the SFA.  A 5 x 40 drug-coated balloon was brought to the field and inflated for 3 minutes.  Follow-up angiography demonstrated excellent result with resolution of flow-limiting stenosis.       Impression: Successful balloon angioplasty of ostial SFA lesion, proximal SFA lesion.  Two-vessel runoff to the level of the ankle with the posterior tibial artery tingling into the foot. Patient's left lower extremity blood flow has been optimized.  ASSESSMENT/PLAN:   B LE PAD   Left LE GT dry gangrene She has intact signals peroneal and PT on the left, right peroneal/PT Suggest consulting podiatry for the GT.  She is maximally vascularized post  I will order ABI's and arterial duplex of the left LE . angiogram.  She has an intact PT that provides inflow to the foot.  Once she has improved fluid over load and respiratory.    No leukocytosis and afebrile.    Roxy Horseman 01/01/2023 8:14 AM   I agree with the above.  I have seen and evaluated the patient.  She recently underwent angiography with intervention by Dr. Virl Cagey who performed balloon angioplasty  to the left superficial femoral artery.  By physical exam, this appears to remain patent.  She has had progressive gangrenous changes to her left great toe which has demarcated.  This does not appear to be infected, or a septic source at this time.  She will require great toe amputation.  I would consult podiatry to assist with this.  In the meantime, she is being admitted for respiratory distress and heart failure.  We will check ABI and duplex  to confirm that her recent vascular intervention remains intact.  Annamarie Major

## 2023-01-01 NOTE — Telephone Encounter (Signed)
Verbal orders given to [facility].  He is aware that the patient is currently admitted at the hospital.  Verbal orders may be required when the patient is discharged.

## 2023-01-01 NOTE — ED Notes (Signed)
Got patient back in bed from the bedside toilet got patient some warm blankets patient is resting with call bell in reach

## 2023-01-01 NOTE — H&P (View-Only) (Signed)
[facility] NOTE   MRN : [id number]  Reason for Consult: left GT dry gangrene Referring Physician: ED  History of Present Illness: 82 y/o female with increased ischemic changes toe the left GT.  She has a recent history of left GT non healing wound angiography and balloon angioplasty of the left SFA 12/24/22 by Dr. Virl Cagey.  The popliteal artery is widely patent, two-vessel, posterior tibial, peroneal runoff. The posterior tibial artery continues into the foot via plantar arteries.    Right leg with nearly-symmetric disease, no intervention.  No wound on the side.  No plan for intervention at this time. Today the left GT toe is ischemic and has dry gangrene.    She returned to [facility] ED with a CC: respiratory distress with concern for acute on chronic heart failure given edema and respiratory presentation.    Current Facility-Administered Medications  Medication Dose Route Frequency Provider Last Rate Last Admin   0.9 %  sodium chloride infusion  250 mL Intravenous PRN Ghimire, Henreitta Leber, MD       acetaminophen (TYLENOL) tablet 650 mg  650 mg Oral Q6H PRN Ghimire, Henreitta Leber, MD       Or   acetaminophen (TYLENOL) suppository 650 mg  650 mg Rectal Q6H PRN Ghimire, Henreitta Leber, MD       albuterol (PROVENTIL) (2.5 MG/3ML) 0.083% nebulizer solution 2.5 mg  2.5 mg Nebulization Q2H PRN Ghimire, Henreitta Leber, MD       apixaban (ELIQUIS) tablet 2.5 mg  2.5 mg Oral BID Ghimire, Henreitta Leber, MD       ascorbic acid (VITAMIN C) tablet 1,000 mg  1,000 mg Oral Daily Ghimire, Henreitta Leber, MD       atorvastatin (LIPITOR) tablet 40 mg  40 mg Oral Daily Ghimire, Henreitta Leber, MD       calcium acetate (PHOSLO) capsule 1,334 mg  1,334 mg Oral TID WC Ghimire, Henreitta Leber, MD       clopidogrel (PLAVIX) tablet 75 mg  75 mg Oral QPM Ghimire, Shanker M, MD       fluticasone furoate-vilanterol (BREO ELLIPTA) 200-25 MCG/ACT 1 puff  1 puff Inhalation PRN Ghimire, Henreitta Leber, MD       furosemide  (LASIX) injection 80 mg  80 mg Intravenous Q12H Ghimire, Henreitta Leber, MD       hydrALAZINE (APRESOLINE) injection 10 mg  10 mg Intravenous Q8H PRN Ghimire, Henreitta Leber, MD       insulin aspart (novoLOG) injection 0-9 Units  0-9 Units Subcutaneous TID WC Ghimire, Shanker M, MD       insulin glargine (2 Unit Dial) (TOUJEO MAX) Solostar Pen SOPN 6 Units  6 Units Subcutaneous QHS Ghimire, Henreitta Leber, MD       isosorbide mononitrate (IMDUR) 24 hr tablet 30 mg  30 mg Oral Daily Ghimire, Shanker M, MD       levETIRAcetam (KEPPRA) tablet 500 mg  500 mg Oral BID Ghimire, Henreitta Leber, MD       melatonin tablet 3 mg  3 mg Oral QHS Ghimire, Henreitta Leber, MD       metoprolol succinate (TOPROL-XL) 24 hr tablet 12.5 mg  12.5 mg Oral Daily Ghimire, Henreitta Leber, MD       nitroGLYCERIN (NITROSTAT) SL tablet 0.4 mg  0.4 mg Sublingual Q5 min PRN Jonetta Osgood, MD       ondansetron [facility]) tablet 4 mg  4 mg Oral Q6H PRN Ghimire, Henreitta Leber, MD  Or   ondansetron (ZOFRAN) injection 4 mg  4 mg Intravenous Q6H PRN Ghimire, Henreitta Leber, MD       oxyCODONE-acetaminophen (PERCOCET/ROXICET) 5-325 MG per tablet 1-2 tablet  1-2 tablet Oral Q6H PRN Ghimire, Henreitta Leber, MD       polyethylene glycol (MIRALAX / GLYCOLAX) packet 17 g  17 g Oral Daily Ghimire, Henreitta Leber, MD       potassium chloride (KLOR-CON) CR tablet 20 mEq  20 mEq Oral Daily Ghimire, Henreitta Leber, MD       sodium chloride flush (NS) 0.9 % injection 3 mL  3 mL Intravenous Q12H Ghimire, Henreitta Leber, MD       sodium chloride flush (NS) 0.9 % injection 3 mL  3 mL Intravenous PRN Ghimire, Henreitta Leber, MD       Current Outpatient Medications  Medication Sig Dispense Refill   apixaban (ELIQUIS) 2.5 MG TABS tablet Take 1 tablet (2.5 mg total) by mouth 2 (two) times daily. 60 tablet 0   ascorbic acid (VITAMIN C) 1000 MG tablet Take 1 tablet (1,000 mg total) by mouth daily. 30 tablet 0   atorvastatin (LIPITOR) 40 MG tablet Take 1 tablet (40 mg total) by mouth daily. 30 tablet 0    calcium acetate (PHOSLO) 667 MG capsule Take 2 capsules (1,334 mg total) by mouth 3 (three) times daily with meals. 180 capsule 2   clopidogrel (PLAVIX) 75 MG tablet Take 1 tablet (75 mg total) by mouth every evening. 30 tablet 0   cyanocobalamin (VITAMIN B12) 1000 MCG/ML injection INJECT 1ML IN THE MUSCLE EVERY 30 DAYS AS DIRECTED (Patient taking differently: Inject 1,000 mcg into the muscle every 30 (thirty) days.) 3 mL 3   docusate sodium (COLACE) 100 MG capsule Take 1 capsule (100 mg total) by mouth 2 (two) times daily. 10 capsule 0   fluticasone furoate-vilanterol (BREO ELLIPTA) 200-25 MCG/ACT AEPB Inhale 1 puff into the lungs as needed (wheezing SOB). 1 each 0   furosemide (LASIX) 40 MG tablet Take 40 mg by mouth daily.     insulin glargine, 2 Unit Dial, (TOUJEO MAX SOLOSTAR) 300 UNIT/ML Solostar Pen Inject 6 Units into the skin at bedtime. (Patient taking differently: Inject 10-16 Units into the skin See admin instructions. Inject 10 units into the skin at supper and 16 units at bedtime) 15 mL 0   isosorbide mononitrate (IMDUR) 30 MG 24 hr tablet Take 1 tablet (30 mg total) by mouth daily. 30 tablet 0   letrozole (FEMARA) 2.5 MG tablet Take 1 tablet (2.5 mg total) by mouth daily. 90 tablet 3   levETIRAcetam (KEPPRA) 500 MG tablet TAKE 1 TABLET(500 MG) BY MOUTH TWICE DAILY 180 tablet 0   LORazepam (ATIVAN) 0.5 MG tablet Take 1 tablet (0.5 mg total) by mouth every 6 (six) hours as needed for anxiety. 30 tablet 0   melatonin 3 MG TABS tablet Take 1 tablet (3 mg total) by mouth at bedtime. (Patient taking differently: Take 3 mg by mouth at bedtime. As needed) 30 tablet 0   methocarbamol (ROBAXIN) 500 MG tablet Take 1 tablet (500 mg total) by mouth every 4 (four) hours as needed for muscle spasms. 60 tablet 0   metoprolol succinate (TOPROL-XL) 25 MG 24 hr tablet Take 0.5 tablets (12.5 mg total) by mouth daily. Take with or immediately following a meal. 30 tablet 0   Multiple Vitamin (MULTIVITAMIN  WITH MINERALS) TABS tablet Take 1 tablet by mouth daily.     nitroGLYCERIN (NITROSTAT) 0.4 MG SL  tablet Place 0.4 mg under the tongue every 5 (five) minutes as needed for chest pain.     oxyCODONE-acetaminophen (PERCOCET/ROXICET) 5-325 MG tablet Take 1-2 tablets by mouth every 6 (six) hours as needed for severe pain. 30 tablet 0   polyethylene glycol (MIRALAX / GLYCOLAX) 17 g packet Take 17 g by mouth daily. 14 each 0   potassium chloride (KLOR-CON) 10 MEQ tablet Take 10 mEq by mouth daily.     psyllium (HYDROCIL/METAMUCIL) 95 % PACK Take 1 packet by mouth daily. 240 each    Vitamin D, Ergocalciferol, (DRISDOL) 1.25 MG (50000 UNIT) CAPS capsule Take 1 capsule (50,000 Units total) by mouth every 7 (seven) days. 5 capsule 0   Facility-Administered Medications Ordered in Other Encounters  Medication Dose Route Frequency Provider Last Rate Last Admin   sodium chloride 0.9 % 1,000 mL with magnesium sulfate 2 g infusion   Intravenous Continuous Ladell Pier, MD   Stopped at 08/04/14 1441    Pt meds include: Statin :yes Betablocker: yes ASA: No Other anticoagulants/antiplatelets: Eliquis  Past Medical History:  Diagnosis Date   Acute myelogenous leukemia ([place]) 02/2014   Acute pancreatitis    Arthritis    "back" (09/17/2017)   Atrial flutter (H[facility])    Cardiomyopathy ([place])    CHF (congestive heart failure) (HCC)    Chronic kidney disease, stage II (mild)    Chronic lower back pain    Colon polyps 04/29/2010   TUBULAR ADENOMA AND A SERRATED ADENOMA   COPD (chronic obstructive pulmonary disease) ([place])    CORONARY ARTERY DISEASE 12/24/2007   DIABETES MELLITUS, TYPE II 07/13/2007   History of blood transfusion 2015   "related to leukemia"   History of kidney stones 12/24/2007   History of uterine cancer    HYPERLIPIDEMIA 12/24/2007   HYPERTENSION 07/13/2007   NSTEMI (non-ST elevated myocardial infarction) ([place]) 09/17/2017   Uterine cancer ([place])     Past Surgical History:   Procedure Laterality Date   ABDOMINAL AORTOGRAM W/LOWER EXTREMITY Bilateral 12/24/2022   Procedure: ABDOMINAL AORTOGRAM W/LOWER EXTREMITY;  Surgeon: Broadus John, MD;  Location: [place] [facility];  Service: Cardiovascular;  Laterality: Bilateral;   ABDOMINAL HYSTERECTOMY     "still have my ovaries"   BREAST BIOPSY Left 09/22/2019   BREAST LUMPECTOMY Left 10/24/2019   BREAST LUMPECTOMY WITH RADIOACTIVE SEED LOCALIZATION Left 10/24/2019   Procedure: LEFT BREAST LUMPECTOMY WITH RADIOACTIVE SEED LOCALIZATION;  Surgeon: Jovita Kussmaul, MD;  Location: [facility];  Service: General;  Laterality: Left;   CARDIAC CATHETERIZATION  2002   CARDIAC CATHETERIZATION  09/17/2017   CATARACT EXTRACTION W/ INTRAOCULAR LENS  IMPLANT, BILATERAL Bilateral    COLONOSCOPY W/ BIOPSIES AND POLYPECTOMY  2011   CORONARY STENT INTERVENTION N/A 09/21/2017   Procedure: CORONARY STENT INTERVENTION;  Surgeon: Troy Sine, MD;  Location: [place] [facility];  Service: Cardiovascular;  Laterality: N/A;   DILATION AND CURETTAGE OF UTERUS     ESOPHAGOGASTRODUODENOSCOPY N/A 01/30/2022   Procedure: ESOPHAGOGASTRODUODENOSCOPY (EGD);  Surgeon: Milus Banister, MD;  Location: [facility];  Service: Endoscopy;  Laterality: N/A;   EUS N/A 01/30/2022   Procedure: UPPER ENDOSCOPIC ULTRASOUND (EUS) RADIAL;  Surgeon: Milus Banister, MD;  Location: [facility];  Service: Endoscopy;  Laterality: N/A;   EXCISIONAL HEMORRHOIDECTOMY  X 2   FINE NEEDLE ASPIRATION N/A 01/30/2022   Procedure: FINE NEEDLE ASPIRATION (FNA) LINEAR;  Surgeon: Milus Banister, MD;  Location: [facility];  Service: Endoscopy;  Laterality: N/A;  GANGLION CYST EXCISION Left X 3   INTRAMEDULLARY (IM) NAIL INTERTROCHANTERIC Right 08/30/2022   Procedure: INTRAMEDULLARY (IM) NAIL INTERTROCHANTERIC;  Surgeon: Altamese Parryville, MD;  Location: [facility];  Service: Orthopedics;  Laterality: Right;   INTRAVASCULAR PRESSURE WIRE/FFR STUDY N/A 09/17/2017    Procedure: INTRAVASCULAR PRESSURE WIRE/FFR STUDY;  Surgeon: Nelva Bush, MD;  Location: [facility];  Service: Cardiovascular;  Laterality: N/A;   IR THORACENTESIS ASP PLEURAL SPACE W/IMG GUIDE  10/27/2022   NASAL SINUS SURGERY     PERIPHERAL VASCULAR BALLOON ANGIOPLASTY  12/24/2022   Procedure: PERIPHERAL VASCULAR BALLOON ANGIOPLASTY;  Surgeon: Broadus John, MD;  Location: [place] [facility];  Service: Cardiovascular;;  Lt. SFA   PORTA CATH INSERTION Right 2013   RIGHT/LEFT HEART CATH AND CORONARY ANGIOGRAPHY N/A 09/17/2017   Procedure: RIGHT/LEFT HEART CATH AND CORONARY ANGIOGRAPHY;  Surgeon: Nelva Bush, MD;  Location: [facility];  Service: Cardiovascular;  Laterality: N/A;   TEE WITHOUT CARDIOVERSION N/A 11/19/2022   Procedure: TRANSESOPHAGEAL ECHOCARDIOGRAM (TEE);  Surgeon: Hebert Soho, DO;  Location: [facility];  Service: Cardiovascular;  Laterality: N/A;   TUBAL LIGATION      Social History Social History   Tobacco Use   Smoking status: Former    Packs/day: 1.00    Years: 20.00    Total pack years: 20.00    Types: Cigarettes    Quit date: 12/08/2000    Years since quitting: 22.0   Smokeless tobacco: Never  Vaping Use   Vaping Use: Never used  Substance Use Topics   Alcohol use: No    Alcohol/week: 0.0 standard drinks of alcohol   Drug use: No    Family History Family History  Problem Relation Age of Onset   COPD Father    Esophageal cancer Father    Breast cancer Sister 12   Lung cancer Sister    Esophageal cancer Brother    Suicidality Brother    Lung cancer Sister    Lung cancer Sister    Cervical cancer Sister    Congestive Heart Failure Mother    Heart attack Maternal Grandmother    Skin cancer Maternal Grandfather    Heart attack Maternal Grandfather    Stroke Paternal Grandmother 72   Heart attack Paternal Grandfather    Cancer Maternal Uncle        unknown type, dx. >81   Cancer Paternal Aunt        unknown type,  dx. >50   Cancer Paternal Uncle        unknown type, dx. >50   Cirrhosis Maternal Uncle    Skin cancer Daughter    Leukemia Cousin        maternal first cousin; dx. >50, in remission   Cancer Niece        unknown type behind her ear, dx. 30s/40s   Colon cancer Neg Hx     Allergies  Allergen Reactions   Chlorhexidine Gluconate Itching and Rash     REVIEW OF SYSTEMS  General: '[ ]'$  Weight loss, '[ ]'$  Fever, '[ ]'$  chills Neurologic: '[ ]'$  Dizziness, '[ ]'$  Blackouts, '[ ]'$  Seizure '[ ]'$  Stroke, '[ ]'$  "Mini stroke", '[ ]'$  Slurred speech, '[ ]'$  Temporary blindness; '[ ]'$  weakness in arms or legs, '[ ]'$  Hoarseness '[ ]'$  Dysphagia Cardiac: '[ ]'$  Chest pain/pressure, [ x] Shortness of breath at rest '[ ]'$  Shortness of breath with exertion, [ x] Atrial fibrillation or irregular heartbeat  Vascular: '[ ]'$  Pain in legs with  walking, '[ ]'$  Pain in legs at rest, '[ ]'$  Pain in legs at night,  '[ ]'$  Non-healing ulcer, '[ ]'$  Blood clot in vein/DVT,   Pulmonary: '[ ]'$  Home oxygen, '[ ]'$  Productive cough, '[ ]'$  Coughing up blood, '[ ]'$  Asthma,  '[ ]'$  Wheezing '[ ]'$  COPD  Musculoskeletal:  '[ ]'$  Arthritis, '[ ]'$  Low back pain, '[ ]'$  Joint pain Hematologic: '[ ]'$  Easy Bruising, '[ ]'$  Anemia; '[ ]'$  Hepatitis Gastrointestinal: '[ ]'$  Blood in stool, '[ ]'$  Gastroesophageal Reflux/heartburn, Urinary: '[ ]'$  chronic Kidney disease, '[ ]'$  on HD - '[ ]'$  MWF or '[ ]'$  TTHS, '[ ]'$  Burning with urination, '[ ]'$  Difficulty urinating Skin: '[ ]'$  Rashes, '[ ]'$  Wounds Psychological: '[ ]'$  Anxiety, '[ ]'$  Depression  Physical Examination Vitals:   01/01/23 0600 01/01/23 0630 01/01/23 0700 01/01/23 0730  BP: (!) 115/52 125/71 131/65 (!) 135/57  Pulse: 100 (!) 119 95 99  Resp: 20 (!) '21 19 20  '$ Temp: 98 F (36.7 C)     TempSrc:      SpO2: 97% 99% 99% 99%  Weight:      Height:       Body mass index is 28.35 kg/m.  General:  WDWN in NAD HENT: WNL Eyes: Pupils equal Pulmonary: normal non-labored breathing , without Rales, rhonchi,  wheezing Cardiac: RRR, without  Murmurs, rubs or gallops; No  carotid bruits Abdomen: soft, NT, no masses Skin: no rashes, ulcers noted;  left GT Gangrene , no cellulitis; no open wounds;   Vascular Exam/Pulses:Doppler signals left PT/peroneal, right peroneal   Musculoskeletal: no muscle wasting or atrophy; positive B LE edema  Neurologic: A&O X 3; Appropriate Affect ;  SENSATION: normal; MOTOR FUNCTION: motor grossly intact Speech is fluent/normal   Significant Diagnostic Studies: CBC Lab Results  Component Value Date   WBC 5.8 12/31/2022   HGB 9.6 (L) 12/31/2022   HCT 30.8 (L) 12/31/2022   MCV 99.7 12/31/2022   PLT 156 12/31/2022    BMET    Component Value Date/Time   NA 140 12/31/2022 1805   NA 147 (H) 05/19/2022 1605   NA 144 09/04/2017 1436   K 4.0 12/31/2022 1805   K 4.1 09/04/2017 1436   CL 102 12/31/2022 1805   CO2 27 12/31/2022 1805   CO2 26 09/04/2017 1436   GLUCOSE 244 (H) 12/31/2022 1805   GLUCOSE 152 (H) 09/04/2017 1436   BUN 85 (H) 12/31/2022 1805   BUN 26 05/19/2022 1605   BUN 20.6 09/04/2017 1436   CREATININE 2.38 (H) 12/31/2022 1805   CREATININE 1.53 (H) 01/22/2022 1457   CREATININE 1.4 (H) 09/04/2017 1436   CALCIUM 6.2 (LL) 12/31/2022 1805   CALCIUM 9.4 09/04/2017 1436   GFRNONAA 20 (L) 12/31/2022 1805   GFRNONAA 34 (L) 01/22/2022 1457   GFRAA 39 (L) 10/04/2020 1404   GFRAA 39 (L) 03/29/2019 1459   Estimated Creatinine Clearance: 17 mL/min (A) (by C-G formula based on SCr of 2.38 mg/dL (H)).  COAG Lab Results  Component Value Date   INR 1.1 12/08/2021   INR 1.1 12/05/2021   INR 1.02 11/21/2017   PROTIME 13.2 02/29/2016     12/24/21 Recent angiogram: Findings: Severe atherosclerotic disease throughout the lower extremities Aortogram: Bilateral renal arteries patent, no flow-limiting stenosis in the aortoiliac segments bilaterally.   On the left: Patent common femoral artery, patent profunda.  70% stenosis at the ostia of the superficial femoral artery with a 3 cm 70% stenotic lesion in the  proximal superficial  femoral artery.  Diffuse atherosclerotic disease throughout the superficial femoral artery.  The popliteal artery is widely patent, two-vessel, posterior tibial, peroneal runoff.  The posterior tibial artery continues into the foot via plantar arteries.   On the right: Patent common femoral artery, patent profunda.  60% ostial stenosis at the superficial femoral artery.  Diffuse atherosclerotic disease throughout the superficial femoral artery.  The popliteal artery is widely patent, two-vessel runoff posterior tibial, peroneal artery.  The posterior tibial artery continues into the foot via the plantar arteries.             Procedure:  The patient was identified in the holding area and taken to room 8.  The patient was then placed supine on the table and prepped and draped in the usual sterile fashion.  A time out was called.  Ultrasound was used to evaluate the right common femoral artery.  It was patent .  A digital ultrasound image was acquired.  A micropuncture needle was used to access the right common femoral artery under ultrasound guidance.  An 018 wire was advanced without resistance and a micropuncture sheath was placed.  The 018 wire was removed and a benson wire was placed.  The micropuncture sheath was exchanged for a 5 french sheath.  An omniflush catheter was advanced over the wire to the level of L-1.  An abdominal angiogram was obtained.  Next, using the omniflush catheter and a benson wire, the aortic bifurcation was crossed and the catheter was placed into theleft external iliac artery and left runoff was obtained.  right runoff was performed via retrograde sheath injections.     I elected to attempt intervention on the left SFA ostial lesion, proximal SFA lesion.  A 6 x 45 cm sheath was brought to the field and parked in the left common femoral artery.  Next, a series of wires and catheters were used to traverse the superficial femoral artery.  Next, a 4 x 40 mm  angioplasty balloon was brought to the field and placed across the ostium of the superficial femoral artery.  This was held for 3 minutes.  Following this, the balloon was used on the proximal superficial femoral artery.  Again, the balloon was inflated for 3 minutes.   Follow-up angiography demonstrated significant improvement in the proximal SFA.  There appeared to be central stenosis at the ostia of the SFA.  A 5 x 40 drug-coated balloon was brought to the field and inflated for 3 minutes.  Follow-up angiography demonstrated excellent result with resolution of flow-limiting stenosis.       Impression: Successful balloon angioplasty of ostial SFA lesion, proximal SFA lesion.  Two-vessel runoff to the level of the ankle with the posterior tibial artery tingling into the foot. Patient's left lower extremity blood flow has been optimized.  ASSESSMENT/PLAN:   B LE PAD   Left LE GT dry gangrene She has intact signals peroneal and PT on the left, right peroneal/PT Suggest consulting podiatry for the GT.  She is maximally vascularized post  I will order ABI's and arterial duplex of the left LE . angiogram.  She has an intact PT that provides inflow to the foot.  Once she has improved fluid over load and respiratory.    No leukocytosis and afebrile.    Roxy Horseman 01/01/2023 8:14 AM   I agree with the above.  I have seen and evaluated the patient.  She recently underwent angiography with intervention by Dr. Virl Cagey who performed balloon angioplasty  to the left superficial femoral artery.  By physical exam, this appears to remain patent.  She has had progressive gangrenous changes to her left great toe which has demarcated.  This does not appear to be infected, or a septic source at this time.  She will require great toe amputation.  I would consult podiatry to assist with this.  In the meantime, she is being admitted for respiratory distress and heart failure.  We will check ABI and duplex  to confirm that her recent vascular intervention remains intact.  Annamarie Major

## 2023-01-01 NOTE — ED Notes (Signed)
ED TO INPATIENT HANDOFF REPORT  ED Nurse Name and Phone #: Vincente Liberty [phone]  S Name/Age/Gender [name] 82 y.o. female Room/Bed: 043C/043C  Code Status   Code Status: DNR  Home/SNF/Other Home Patient oriented to: self, place, time, and situation Is this baseline? Yes   Triage Complete: Triage complete  Chief Complaint Acute on chronic diastolic CHF (congestive heart failure) ([place]) [I50.33] [name] [I50.9]  Triage Note Pt states she was sent in by her PCP for concern of leg swelling and wound on left foot that has been going on for approx 1 month. Pt states the swelling has made it hard for her to take care of herself at home. Pt also endorses shortness of breath that worsens with exertion for the past couple months.    Allergies Allergies  Allergen Reactions   Chlorhexidine Gluconate Itching and Rash    Level of Care/Admitting Diagnosis ED Disposition     ED Disposition  Admit   Condition  --   Comment  Hospital Area: [facility] [[id number]]  Level of Care: Telemetry Medical [[id number]]  May admit patient to [facility] or [facility] if equivalent level of care is available:: No  Covid Evaluation: Asymptomatic - no recent exposure (last 10 days) testing not required  Diagnosis: CHF exacerbation [facility]) [[id number]]  Admitting Physician: Jonetta Osgood [[id number]]  Attending Physician: Jonetta Osgood [[id number]]  Certification:: I certify this patient will need inpatient services for at least 2 midnights  Estimated Length of Stay: 2          B Medical/Surgery History Past Medical History:  Diagnosis Date   Acute myelogenous leukemia ([place]) 02/2014   Acute pancreatitis    Arthritis    "back" (09/17/2017)   Atrial flutter ([place])    Cardiomyopathy ([place])    CHF (congestive heart failure) ([place])    Chronic kidney disease, stage II (mild)    Chronic lower back pain    Colon polyps 04/29/2010   TUBULAR ADENOMA AND A SERRATED ADENOMA    COPD (chronic obstructive pulmonary disease) ([place])    CORONARY ARTERY DISEASE 12/24/2007   DIABETES MELLITUS, TYPE II 07/13/2007   History of blood transfusion 2015   "related to leukemia"   History of kidney stones 12/24/2007   History of uterine cancer    HYPERLIPIDEMIA 12/24/2007   HYPERTENSION 07/13/2007   NSTEMI (non-ST elevated myocardial infarction) ([place]) 09/17/2017   Uterine cancer ([place])    Past Surgical History:  Procedure Laterality Date   ABDOMINAL AORTOGRAM W/LOWER EXTREMITY Bilateral 12/24/2022   Procedure: ABDOMINAL AORTOGRAM W/LOWER EXTREMITY;  Surgeon: Broadus John, MD;  Location: [facility];  Service: Cardiovascular;  Laterality: Bilateral;   ABDOMINAL HYSTERECTOMY     "still have my ovaries"   BREAST BIOPSY Left 09/22/2019   BREAST LUMPECTOMY Left 10/24/2019   BREAST LUMPECTOMY WITH RADIOACTIVE SEED LOCALIZATION Left 10/24/2019   Procedure: LEFT BREAST LUMPECTOMY WITH RADIOACTIVE SEED LOCALIZATION;  Surgeon: Jovita Kussmaul, MD;  Location: [facility];  Service: General;  Laterality: Left;   CARDIAC CATHETERIZATION  2002   CARDIAC CATHETERIZATION  09/17/2017   CATARACT EXTRACTION W/ INTRAOCULAR LENS  IMPLANT, BILATERAL Bilateral    COLONOSCOPY W/ BIOPSIES AND POLYPECTOMY  2011   CORONARY STENT INTERVENTION N/A 09/21/2017   Procedure: CORONARY STENT INTERVENTION;  Surgeon: Troy Sine, MD;  Location: [facility];  Service: Cardiovascular;  Laterality: N/A;   DILATION AND CURETTAGE OF UTERUS     ESOPHAGOGASTRODUODENOSCOPY  N/A 01/30/2022   Procedure: ESOPHAGOGASTRODUODENOSCOPY (EGD);  Surgeon: Milus Banister, MD;  Location: [facility];  Service: Endoscopy;  Laterality: N/A;   EUS N/A 01/30/2022   Procedure: UPPER ENDOSCOPIC ULTRASOUND (EUS) RADIAL;  Surgeon: Milus Banister, MD;  Location: [facility];  Service: Endoscopy;  Laterality: N/A;   EXCISIONAL HEMORRHOIDECTOMY  X 2   FINE NEEDLE ASPIRATION N/A 01/30/2022   Procedure:  FINE NEEDLE ASPIRATION (FNA) LINEAR;  Surgeon: Milus Banister, MD;  Location: [facility];  Service: Endoscopy;  Laterality: N/A;   GANGLION CYST EXCISION Left X 3   INTRAMEDULLARY (IM) NAIL INTERTROCHANTERIC Right 08/30/2022   Procedure: INTRAMEDULLARY (IM) NAIL INTERTROCHANTERIC;  Surgeon: Altamese Mount Gilead, MD;  Location: [facility];  Service: Orthopedics;  Laterality: Right;   INTRAVASCULAR PRESSURE WIRE/FFR STUDY N/A 09/17/2017   Procedure: INTRAVASCULAR PRESSURE WIRE/FFR STUDY;  Surgeon: Nelva Bush, MD;  Location: [facility];  Service: Cardiovascular;  Laterality: N/A;   IR THORACENTESIS ASP PLEURAL SPACE W/IMG GUIDE  10/27/2022   NASAL SINUS SURGERY     PERIPHERAL VASCULAR BALLOON ANGIOPLASTY  12/24/2022   Procedure: PERIPHERAL VASCULAR BALLOON ANGIOPLASTY;  Surgeon: Broadus John, MD;  Location: [place] [facility];  Service: Cardiovascular;;  Lt. SFA   PORTA CATH INSERTION Right 2013   RIGHT/LEFT HEART CATH AND CORONARY ANGIOGRAPHY N/A 09/17/2017   Procedure: RIGHT/LEFT HEART CATH AND CORONARY ANGIOGRAPHY;  Surgeon: Nelva Bush, MD;  Location: [facility];  Service: Cardiovascular;  Laterality: N/A;   TEE WITHOUT CARDIOVERSION N/A 11/19/2022   Procedure: TRANSESOPHAGEAL ECHOCARDIOGRAM (TEE);  Surgeon: Hebert Soho, DO;  Location: [facility];  Service: Cardiovascular;  Laterality: N/A;   TUBAL LIGATION       A IV Location/Drains/Wounds Patient Lines/Drains/Airways Status     Active Line/Drains/Airways     Name Placement date Placement time Site Days   Implanted Port Right Chest --  --  Chest  --   External Urinary Catheter 12/13/22  1000  --  19   Wound / Incision (Open or Dehisced) 12/07/22 Irritant Dermatitis (Moisture Associated Skin Damage) Abdomen Anterior;Lower MASD/yeast to abdominal fold 12/07/22  2200  Abdomen  25   Wound / Incision (Open or Dehisced) 12/10/22 Non-pressure wound Toe (Comment  which one) Anterior;Left 12/10/22  2200  Toe  (Comment  which one)  22            Intake/Output Last 24 hours  Intake/Output Summary (Last 24 hours) at 01/01/2023 1255 Last data filed at 01/01/2023 1208 Gross per 24 hour  Intake 50 ml  Output --  Net 50 ml    Labs/Imaging Results for orders placed or performed during the hospital encounter of 12/31/22 (from the past 48 hour(s))  Basic metabolic panel     Status: Abnormal   Collection Time: 12/31/22  6:05 PM  Result Value Ref Range   Sodium 140 135 - 145 mmol/L   Potassium 4.0 3.5 - 5.1 mmol/L   Chloride 102 98 - 111 mmol/L   CO2 27 22 - 32 mmol/L   Glucose, Bld 244 (H) 70 - 99 mg/dL    Comment: Glucose reference range applies only to samples taken after fasting for at least 8 hours.   BUN 85 (H) 8 - 23 mg/dL   Creatinine, Ser 2.38 (H) 0.44 - 1.00 mg/dL   Calcium 6.2 (LL) 8.9 - 10.3 mg/dL    Comment: CRITICAL RESULT CALLED TO, READ BACK BY AND VERIFIED WITH P PULLIAM RN 12/31/2022 2001 B NUNNERY   GFR,  Estimated 20 (L) >60 mL/min    Comment: (NOTE) Calculated using the CKD-EPI Creatinine Equation (2021)    Anion gap 11 5 - 15    Comment: Performed at [facility], [place] [address], [place], [place] [address]  CBC with Differential     Status: Abnormal   Collection Time: 12/31/22  6:05 PM  Result Value Ref Range   WBC 5.8 4.0 - 10.5 K/uL   RBC 3.09 (L) 3.87 - 5.11 MIL/uL   Hemoglobin 9.6 (L) 12.0 - 15.0 g/dL   HCT 30.8 (L) 36.0 - 46.0 %   MCV 99.7 80.0 - 100.0 fL   MCH 31.1 26.0 - 34.0 pg   MCHC 31.2 30.0 - 36.0 g/dL   RDW 17.2 (H) 11.5 - 15.5 %   Platelets 156 150 - 400 K/uL   nRBC 0.0 0.0 - 0.2 %   Neutrophils Relative % 81 %   Neutro Abs 4.6 1.7 - 7.7 K/uL   Lymphocytes Relative 13 %   Lymphs Abs 0.8 0.7 - 4.0 K/uL   Monocytes Relative 6 %   Monocytes Absolute 0.4 0.1 - 1.0 K/uL   Eosinophils Relative 0 %   Eosinophils Absolute 0.0 0.0 - 0.5 K/uL   Basophils Relative 0 %   Basophils Absolute 0.0 0.0 - 0.1 K/uL   Immature Granulocytes 0 %   Abs  Immature Granulocytes 0.02 0.00 - 0.07 K/uL    Comment: Performed at [facility] [address], [place], [place] [address]  Culture, blood (routine x 2)     Status: None (Preliminary result)   Collection Time: 12/31/22  6:05 PM   Specimen: BLOOD  Result Value Ref Range   Specimen Description BLOOD RIGHT ANTECUBITAL    Special Requests      BOTTLES DRAWN AEROBIC AND ANAEROBIC Blood Culture adequate volume   Culture      NO GROWTH < 12 HOURS Performed at [facility], [place] [address], [place], [place] [address]    Report Status PENDING   Lactic acid, plasma     Status: None   Collection Time: 12/31/22  6:05 PM  Result Value Ref Range   Lactic Acid, Venous 1.2 0.5 - 1.9 mmol/L    Comment: Performed at [facility] [address] [place], [place] [address]  Lactic acid, plasma     Status: None   Collection Time: 01/01/23  2:05 AM  Result Value Ref Range   Lactic Acid, Venous 0.7 0.5 - 1.9 mmol/L    Comment: Performed at [facility] [address], [place], [place] [address]  Brain natriuretic peptide     Status: Abnormal   Collection Time: 01/01/23  4:11 AM  Result Value Ref Range   B Natriuretic Peptide 881.2 (H) 0.0 - 100.0 pg/mL    Comment: Performed at [facility] [address], [place], [place] [address]  Resp panel by RT-PCR (RSV, Flu A&B, Covid) Anterior Nasal Swab     Status: None   Collection Time: 01/01/23  4:27 AM   Specimen: Anterior Nasal Swab  Result Value Ref Range   SARS Coronavirus 2 by RT PCR NEGATIVE NEGATIVE    Comment: (NOTE) SARS-CoV-2 target nucleic acids are NOT DETECTED.  The SARS-CoV-2 RNA is generally detectable in upper respiratory specimens during the acute phase of infection. The lowest concentration of SARS-CoV-2 viral copies this assay can detect is 138 copies/mL. A negative result does not preclude SARS-Cov-2 infection and  should not be used as the sole basis for treatment or other patient management  decisions. A negative result may occur with  improper specimen collection/handling, submission of specimen other than nasopharyngeal swab, presence of viral mutation(s) within the areas targeted by this assay, and inadequate number of viral copies(<138 copies/mL). A negative result must be combined with clinical observations, patient history, and epidemiological information. The expected result is Negative.  Fact Sheet for Patients:  EntrepreneurPulse.com.au  Fact Sheet for Healthcare Providers:  IncredibleEmployment.be  This test is no t yet approved or cleared by the [place] FDA and  has been authorized for detection and/or diagnosis of SARS-CoV-2 by FDA under an Emergency Use Authorization (EUA). This EUA will remain  in effect (meaning this test can be used) for the duration of the COVID-19 declaration under Section 564(b)(1) of the Act, 21 U.S.C.section 360bbb-3(b)(1), unless the authorization is terminated  or revoked sooner.       Influenza A by PCR NEGATIVE NEGATIVE   Influenza B by PCR NEGATIVE NEGATIVE    Comment: (NOTE) The Xpert Xpress SARS-CoV-2/FLU/RSV plus assay is intended as an aid in the diagnosis of influenza from Nasopharyngeal swab specimens and should not be used as a sole basis for treatment. Nasal washings and aspirates are unacceptable for Xpert Xpress SARS-CoV-2/FLU/RSV testing.  Fact Sheet for Patients: EntrepreneurPulse.com.au  Fact Sheet for Healthcare Providers: IncredibleEmployment.be  This test is not yet approved or cleared by the [place] FDA and has been authorized for detection and/or diagnosis of SARS-CoV-2 by FDA under an Emergency Use Authorization (EUA). This EUA will remain in effect (meaning this test can be used) for the duration of the COVID-19 declaration under Section 564(b)(1) of the Act, 21 U.S.C. section 360bbb-3(b)(1), unless the authorization  is terminated or revoked.     Resp Syncytial Virus by PCR NEGATIVE NEGATIVE    Comment: (NOTE) Fact Sheet for Patients: EntrepreneurPulse.com.au  Fact Sheet for Healthcare Providers: IncredibleEmployment.be  This test is not yet approved or cleared by the [place] FDA and has been authorized for detection and/or diagnosis of SARS-CoV-2 by FDA under an Emergency Use Authorization (EUA). This EUA will remain in effect (meaning this test can be used) for the duration of the COVID-19 declaration under Section 564(b)(1) of the Act, 21 U.S.C. section 360bbb-3(b)(1), unless the authorization is terminated or revoked.  Performed at [facility], [place] [address] [place], [place] [address]   CBG monitoring, ED     Status: Abnormal   Collection Time: 01/01/23  8:35 AM  Result Value Ref Range   Glucose-Capillary 238 (H) 70 - 99 mg/dL    Comment: Glucose reference range applies only to samples taken after fasting for at least 8 hours.   *Note: Due to a large number of results and/or encounters for the requested time period, some results have not been displayed. A complete set of results can be found in Results Review.   DG Chest Portable 1 View  Result Date: 01/01/2023 CLINICAL DATA:  Shortness of breath and leg swelling. EXAM: PORTABLE CHEST 1 VIEW COMPARISON:  December 24, 2022 FINDINGS: There is stable right-sided venous Port-A-Cath positioning. The cardiac silhouette is mildly enlarged and unchanged in size. There is marked severity calcification of the aortic arch. Mild prominence of the bilateral perihilar pulmonary vasculature is noted. Mild atelectasis is seen within the bilateral lung bases. Small bilateral pleural effusions are seen. No pneumothorax is identified. Radiopaque surgical clips are seen in the soft tissues of the left  breast. The visualized skeletal structures are unremarkable. IMPRESSION: 1. Stable cardiomegaly with mild  pulmonary vascular congestion. 2. Mild bibasilar atelectasis and small bilateral pleural effusions. Electronically Signed   By: Virgina Norfolk M.D.   On: 01/01/2023 01:25   DG Foot Complete Left  Result Date: 12/31/2022 CLINICAL DATA:  Worsening left foot wounds. EXAM: LEFT FOOT - COMPLETE 3+ VIEW COMPARISON:  None Available. FINDINGS: There is no evidence of an acute fracture or dislocation. Diffuse osteopenia is noted without areas of acute cortical destruction. There is a moderate to large plantar calcaneal spur with mild degenerative changes seen within the mid left foot. Moderate severity diffuse soft tissue swelling is seen, more prominent along the dorsal aspect of the left foot. IMPRESSION: Moderate severity diffuse soft tissue swelling without evidence of acute osteomyelitis. MRI correlation is recommended if acute osteomyelitis remains of clinical concern. Electronically Signed   By: Virgina Norfolk M.D.   On: 12/31/2022 19:16    Pending Labs Unresulted Labs (From admission, onward)     Start     Ordered   01/02/23 0500  CBC  Tomorrow morning,   R        01/01/23 0813   01/02/23 0500  Renal function panel  Tomorrow morning,   R        01/01/23 0816   12/31/22 1745  Culture, blood (routine x 2)  BLOOD CULTURE X 2,   R (with STAT occurrences)      12/31/22 1745            Vitals/Pain Today's Vitals   01/01/23 0700 01/01/23 0730 01/01/23 1045 01/01/23 1130  BP: 131/65 (!) 135/57 (!) 129/97 123/65  Pulse: 95 99 89 81  Resp: '19 20 17 14  '$ Temp:      TempSrc:      SpO2: 99% 99% 100% 99%  Weight:      Height:      PainSc:        Isolation Precautions No active isolations  Medications Medications  oxyCODONE-acetaminophen (PERCOCET/ROXICET) 5-325 MG per tablet 1-2 tablet (has no administration in time range)  atorvastatin (LIPITOR) tablet 40 mg (40 mg Oral Given 01/01/23 1000)  metoprolol succinate (TOPROL-XL) 24 hr tablet 12.5 mg (12.5 mg Oral Given 01/01/23 0959)   isosorbide mononitrate (IMDUR) 24 hr tablet 30 mg (has no administration in time range)  nitroGLYCERIN (NITROSTAT) SL tablet 0.4 mg (has no administration in time range)  insulin glargine (2 Unit Dial) (TOUJEO MAX) Solostar Pen SOPN 6 Units (has no administration in time range)  calcium acetate (PHOSLO) capsule 1,334 mg (has no administration in time range)  polyethylene glycol (MIRALAX / GLYCOLAX) packet 17 g (17 g Oral Given 01/01/23 1001)  apixaban (ELIQUIS) tablet 2.5 mg (2.5 mg Oral Given 01/01/23 0959)  clopidogrel (PLAVIX) tablet 75 mg (has no administration in time range)  melatonin tablet 3 mg (has no administration in time range)  levETIRAcetam (KEPPRA) tablet 500 mg (500 mg Oral Given 01/01/23 1000)  ascorbic acid (VITAMIN C) tablet 1,000 mg (has no administration in time range)  potassium chloride (KLOR-CON) CR tablet 20 mEq (20 mEq Oral Given 01/01/23 0959)  fluticasone furoate-vilanterol (BREO ELLIPTA) 200-25 MCG/ACT 1 puff (has no administration in time range)  furosemide (LASIX) injection 80 mg (has no administration in time range)  sodium chloride flush (NS) 0.9 % injection 3 mL (3 mLs Intravenous Given 01/01/23 1039)  sodium chloride flush (NS) 0.9 % injection 3 mL (has no administration in time range)  0.9 %  sodium chloride infusion (has no administration in time range)  acetaminophen (TYLENOL) tablet 650 mg (has no administration in time range)    Or  acetaminophen (TYLENOL) suppository 650 mg (has no administration in time range)  ondansetron (ZOFRAN) tablet 4 mg (has no administration in time range)    Or  ondansetron (ZOFRAN) injection 4 mg (has no administration in time range)  albuterol (PROVENTIL) (2.5 MG/3ML) 0.083% nebulizer solution 2.5 mg (has no administration in time range)  hydrALAZINE (APRESOLINE) injection 10 mg (has no administration in time range)  insulin aspart (novoLOG) injection 0-9 Units (3 Units Subcutaneous Given 01/01/23 0958)  calcitRIOL (ROCALTROL)  capsule 0.25 mcg (0.25 mcg Oral Given 01/01/23 0958)  calcium carbonate (OS-CAL - dosed in mg of elemental calcium) tablet 1,250 mg (has no administration in time range)  ipratropium-albuterol (DUONEB) 0.5-2.5 (3) MG/3ML nebulizer solution 3 mL (3 mLs Nebulization Given 01/01/23 0425)  methylPREDNISolone sodium succinate (SOLU-MEDROL) 125 mg/2 mL injection 125 mg (125 mg Intravenous Given 01/01/23 0427)  acetaminophen (TYLENOL) tablet 1,000 mg (1,000 mg Oral Given 01/01/23 0424)  furosemide (LASIX) injection 40 mg (40 mg Intravenous Given 01/01/23 0604)  calcium gluconate 1 g/ 50 mL sodium chloride IVPB (0 mg Intravenous Stopped 01/01/23 1208)    Mobility walks with device     Focused Assessments CHF/cardiac left great toe is black    R Recommendations: See Admitting Provider Note  Report given to:   Additional Notes: pt is on 3LNC does not wear O2 at home

## 2023-01-01 NOTE — H&P (Signed)
History and Physical    Patient: [name] YBO:[id number] DOB: 16-Feb-1941 DOA: 12/31/2022 DOS: the patient was seen and examined on 01/01/2023 PCP: Isaac Bliss, Rayford Halsted, MD  Patient coming from: Home  Chief Complaint:  Chief Complaint  Patient presents with   Leg Swelling    Left    HPI: [name] is a 82 y.o. female with medical history significant of PAD with critical limb ischemia (dry gangrene of left great toe), chronic HFpEF, PAF on Eliquis, CAD (managed medically-Plavix/statin) CKD stage IV, DM-2, Keppra, history of AML (in remission)-sent from PCPs office for evaluation of worsening lower extremity edema, weakness and mild hypoxemia.  Patient was hospitalized from 12/30-1/18 for left foot pain/limb ischemia-underwent arteriogram with balloon angioplasty-during this hospitalization-she was treated for UTI/hypokalemia, AKI, acute on chronic CHF-and subsequently discharged home with home health services.  Per patient-since her discharge on 1/18-she felt relatively okay for a few days-then started having generalized weakness, fatigue, and worsening lower extremity edema.  She has developed more blackish discoloration of her left great toe over the past few days.  Because of these issues-she went to her PCPs office 1/24-she was found to be mildly hypoxic-requiring O2 supplementation-she was thought to have decompensated heart failure-and referred to the emergency room.  In the ED-she was thought to have decompensated heart failure-ED she was wheezing-she was given steroids/DuoNeb/Lasix-started on 2 L of oxygen-and subsequently referred to the hospitalist service.  Patient denies any fever, headache, nausea, vomiting, diarrhea, abdominal pain, dysuria, hematuria.  Review of Systems: As mentioned in the history of present illness. All other systems reviewed and are negative. Past Medical History:  Diagnosis Date   Acute myelogenous leukemia ([place]) 02/2014   Acute  pancreatitis    Arthritis    "back" (09/17/2017)   Atrial flutter (HCC)    Cardiomyopathy (HCC)    CHF (congestive heart failure) (HCC)    Chronic kidney disease, stage II (mild)    Chronic lower back pain    Colon polyps 04/29/2010   TUBULAR ADENOMA AND A SERRATED ADENOMA   COPD (chronic obstructive pulmonary disease) ([place])    CORONARY ARTERY DISEASE 12/24/2007   DIABETES MELLITUS, TYPE II 07/13/2007   History of blood transfusion 2015   "related to leukemia"   History of kidney stones 12/24/2007   History of uterine cancer    HYPERLIPIDEMIA 12/24/2007   HYPERTENSION 07/13/2007   NSTEMI (non-ST elevated myocardial infarction) ([place]) 09/17/2017   Uterine cancer ([place])    Past Surgical History:  Procedure Laterality Date   ABDOMINAL AORTOGRAM W/LOWER EXTREMITY Bilateral 12/24/2022   Procedure: ABDOMINAL AORTOGRAM W/LOWER EXTREMITY;  Surgeon: Broadus John, MD;  Location: [facility];  Service: Cardiovascular;  Laterality: Bilateral;   ABDOMINAL HYSTERECTOMY     "still have my ovaries"   BREAST BIOPSY Left 09/22/2019   BREAST LUMPECTOMY Left 10/24/2019   BREAST LUMPECTOMY WITH RADIOACTIVE SEED LOCALIZATION Left 10/24/2019   Procedure: LEFT BREAST LUMPECTOMY WITH RADIOACTIVE SEED LOCALIZATION;  Surgeon: Jovita Kussmaul, MD;  Location: [facility];  Service: General;  Laterality: Left;   CARDIAC CATHETERIZATION  2002   CARDIAC CATHETERIZATION  09/17/2017   CATARACT EXTRACTION W/ INTRAOCULAR LENS  IMPLANT, BILATERAL Bilateral    COLONOSCOPY W/ BIOPSIES AND POLYPECTOMY  2011   CORONARY STENT INTERVENTION N/A 09/21/2017   Procedure: CORONARY STENT INTERVENTION;  Surgeon: Troy Sine, MD;  Location: [facility];  Service: Cardiovascular;  Laterality: N/A;   DILATION AND CURETTAGE OF UTERUS  ESOPHAGOGASTRODUODENOSCOPY N/A 01/30/2022   Procedure: ESOPHAGOGASTRODUODENOSCOPY (EGD);  Surgeon: Milus Banister, MD;  Location: [facility];  Service:  Endoscopy;  Laterality: N/A;   EUS N/A 01/30/2022   Procedure: UPPER ENDOSCOPIC ULTRASOUND (EUS) RADIAL;  Surgeon: Milus Banister, MD;  Location: [facility];  Service: Endoscopy;  Laterality: N/A;   EXCISIONAL HEMORRHOIDECTOMY  X 2   FINE NEEDLE ASPIRATION N/A 01/30/2022   Procedure: FINE NEEDLE ASPIRATION (FNA) LINEAR;  Surgeon: Milus Banister, MD;  Location: [facility];  Service: Endoscopy;  Laterality: N/A;   GANGLION CYST EXCISION Left X 3   INTRAMEDULLARY (IM) NAIL INTERTROCHANTERIC Right 08/30/2022   Procedure: INTRAMEDULLARY (IM) NAIL INTERTROCHANTERIC;  Surgeon: Altamese Confluence, MD;  Location: [facility];  Service: Orthopedics;  Laterality: Right;   INTRAVASCULAR PRESSURE WIRE/FFR STUDY N/A 09/17/2017   Procedure: INTRAVASCULAR PRESSURE WIRE/FFR STUDY;  Surgeon: Nelva Bush, MD;  Location: [facility];  Service: Cardiovascular;  Laterality: N/A;   IR THORACENTESIS ASP PLEURAL SPACE W/IMG GUIDE  10/27/2022   NASAL SINUS SURGERY     PERIPHERAL VASCULAR BALLOON ANGIOPLASTY  12/24/2022   Procedure: PERIPHERAL VASCULAR BALLOON ANGIOPLASTY;  Surgeon: Broadus John, MD;  Location: [facility];  Service: Cardiovascular;;  Lt. SFA   PORTA CATH INSERTION Right 2013   RIGHT/LEFT HEART CATH AND CORONARY ANGIOGRAPHY N/A 09/17/2017   Procedure: RIGHT/LEFT HEART CATH AND CORONARY ANGIOGRAPHY;  Surgeon: Nelva Bush, MD;  Location: [facility];  Service: Cardiovascular;  Laterality: N/A;   TEE WITHOUT CARDIOVERSION N/A 11/19/2022   Procedure: TRANSESOPHAGEAL ECHOCARDIOGRAM (TEE);  Surgeon: Hebert Soho, DO;  Location: [facility];  Service: Cardiovascular;  Laterality: N/A;   TUBAL LIGATION     Social History:  reports that she quit smoking about 22 years ago. Her smoking use included cigarettes. She has a 20.00 pack-year smoking history. She has never used smokeless tobacco. She reports that she does not drink alcohol and does not use drugs.  Allergies  Allergen  Reactions   Chlorhexidine Gluconate Itching and Rash    Family History  Problem Relation Age of Onset   COPD Father    Esophageal cancer Father    Breast cancer Sister 35   Lung cancer Sister    Esophageal cancer Brother    Suicidality Brother    Lung cancer Sister    Lung cancer Sister    Cervical cancer Sister    Congestive Heart Failure Mother    Heart attack Maternal Grandmother    Skin cancer Maternal Grandfather    Heart attack Maternal Grandfather    Stroke Paternal Grandmother 7   Heart attack Paternal Grandfather    Cancer Maternal Uncle        unknown type, dx. >65   Cancer Paternal Aunt        unknown type, dx. >50   Cancer Paternal Uncle        unknown type, dx. >50   Cirrhosis Maternal Uncle    Skin cancer Daughter    Leukemia Cousin        maternal first cousin; dx. >50, in remission   Cancer Niece        unknown type behind her ear, dx. 30s/40s   Colon cancer Neg Hx     Prior to Admission medications   Medication Sig Start Date End Date Taking? Authorizing Provider  apixaban (ELIQUIS) 2.5 MG TABS tablet Take 1 tablet (2.5 mg total) by mouth 2 (two) times daily. 09/30/22   Angiulli, Lavon Paganini, PA-C  ascorbic acid (VITAMIN C) 1000 MG tablet Take 1 tablet (1,000 mg total) by mouth daily. 09/30/22   Angiulli, Lavon Paganini, PA-C  atorvastatin (LIPITOR) 40 MG tablet Take 1 tablet (40 mg total) by mouth daily. 09/30/22   Angiulli, Lavon Paganini, PA-C  calcium acetate (PHOSLO) 667 MG capsule Take 2 capsules (1,334 mg total) by mouth 3 (three) times daily with meals. 12/25/22   Bonnielee Haff, MD  clopidogrel (PLAVIX) 75 MG tablet Take 1 tablet (75 mg total) by mouth every evening. 09/30/22   Angiulli, Lavon Paganini, PA-C  cyanocobalamin (VITAMIN B12) 1000 MCG/ML injection INJECT 1ML IN THE MUSCLE EVERY 30 DAYS AS DIRECTED Patient taking differently: Inject 1,000 mcg into the muscle every 30 (thirty) days. 08/28/22   Isaac Bliss, Rayford Halsted, MD  docusate sodium (COLACE) 100 MG  capsule Take 1 capsule (100 mg total) by mouth 2 (two) times daily. 09/30/22   Angiulli, Lavon Paganini, PA-C  fluticasone furoate-vilanterol (BREO ELLIPTA) 200-25 MCG/ACT AEPB Inhale 1 puff into the lungs as needed (wheezing SOB). 09/30/22   Angiulli, Lavon Paganini, PA-C  furosemide (LASIX) 40 MG tablet Take 40 mg by mouth daily.    [provider]  insulin glargine, 2 Unit Dial, (TOUJEO MAX SOLOSTAR) 300 UNIT/ML Solostar Pen Inject 6 Units into the skin at bedtime. Patient taking differently: Inject 10-16 Units into the skin See admin instructions. Inject 10 units into the skin at supper and 16 units at bedtime 09/05/22   Florencia Reasons, MD  isosorbide mononitrate (IMDUR) 30 MG 24 hr tablet Take 1 tablet (30 mg total) by mouth daily. 09/30/22   Angiulli, Lavon Paganini, PA-C  letrozole [facility]) 2.5 MG tablet Take 1 tablet (2.5 mg total) by mouth daily. 02/14/22   Owens Shark, NP  levETIRAcetam (KEPPRA) 500 MG tablet TAKE 1 TABLET(500 MG) BY MOUTH TWICE DAILY 12/02/22   Raulkar, Clide Deutscher, MD  LORazepam (ATIVAN) 0.5 MG tablet Take 1 tablet (0.5 mg total) by mouth every 6 (six) hours as needed for anxiety. 09/30/22   Angiulli, Lavon Paganini, PA-C  melatonin 3 MG TABS tablet Take 1 tablet (3 mg total) by mouth at bedtime. Patient taking differently: Take 3 mg by mouth at bedtime. As needed 09/30/22   Angiulli, Lavon Paganini, PA-C  methocarbamol (ROBAXIN) 500 MG tablet Take 1 tablet (500 mg total) by mouth every 4 (four) hours as needed for muscle spasms. 09/30/22   Angiulli, Lavon Paganini, PA-C  metoprolol succinate (TOPROL-XL) 25 MG 24 hr tablet Take 0.5 tablets (12.5 mg total) by mouth daily. Take with or immediately following a meal. 09/30/22   Angiulli, Lavon Paganini, PA-C  Multiple Vitamin (MULTIVITAMIN WITH MINERALS) TABS tablet Take 1 tablet by mouth daily. 09/30/22   Angiulli, Lavon Paganini, PA-C  nitroGLYCERIN (NITROSTAT) 0.4 MG SL tablet Place 0.4 mg under the tongue every 5 (five) minutes as needed for chest pain. 11/08/22    [provider]  oxyCODONE-acetaminophen (PERCOCET/ROXICET) 5-325 MG tablet Take 1-2 tablets by mouth every 6 (six) hours as needed for severe pain. 12/25/22   Bonnielee Haff, MD  polyethylene glycol (MIRALAX / GLYCOLAX) 17 g packet Take 17 g by mouth daily. 09/30/22   Angiulli, Lavon Paganini, PA-C  potassium chloride (KLOR-CON) 10 MEQ tablet Take 10 mEq by mouth daily. 11/21/22   [provider]  psyllium (HYDROCIL/METAMUCIL) 95 % PACK Take 1 packet by mouth daily. 09/30/22   Angiulli, Lavon Paganini, PA-C  Vitamin D, Ergocalciferol, (DRISDOL) 1.25 MG (50000 UNIT) CAPS capsule Take 1 capsule (50,000  Units total) by mouth every 7 (seven) days. 12/31/22   Bonnielee Haff, MD    Physical Exam: Vitals:   01/01/23 0600 01/01/23 0630 01/01/23 0700 01/01/23 0730  BP: (!) 115/52 125/71 131/65 (!) 135/57  Pulse: 100 (!) 119 95 99  Resp: 20 (!) '21 19 20  '$ Temp: 98 F (36.7 C)     TempSrc:      SpO2: 97% 99% 99% 99%  Weight:      Height:       Gen Exam:Alert awake-not in any distress.  Looks frail. HEENT:atraumatic, normocephalic Chest: Diminished air entry at bases bilaterally-few bibasilar rales. CVS:S1S2 regular Abdomen:soft non tender, non distended Extremities:+++ edema.  Gangrenous left great toe. Neurology: Non focal Skin: no rash   Data Reviewed:     Latest Ref Rng & Units 12/31/2022    6:05 PM 12/25/2022    6:40 PM 12/25/2022    6:10 AM  CBC  WBC 4.0 - 10.5 K/uL 5.8   8.0   Hemoglobin 12.0 - 15.0 g/dL 9.6  8.2  7.2   Hematocrit 36.0 - 46.0 % 30.8  25.9  23.8   Platelets 150 - 400 K/uL 156   153        Latest Ref Rng & Units 12/31/2022    6:05 PM 12/25/2022    6:10 AM 12/24/2022    5:12 AM  BMP  Glucose 70 - 99 mg/dL 244  295  309   BUN 8 - 23 mg/dL 85  76  76   Creatinine 0.44 - 1.00 mg/dL 2.38  2.43  2.61   Sodium 135 - 145 mmol/L 140  139  141   Potassium 3.5 - 5.1 mmol/L 4.0  4.1  4.4   Chloride 98 - 111 mmol/L 102  99  100   CO2 22 - 32 mmol/L 27  31  32    Calcium 8.9 - 10.3 mg/dL 6.2  6.6  7.0      12-lead EKG: Atrial fibrillation.  Assessment and Plan: Acute hypoxic respiratory failure Due to HFpEF exacerbation Titrate of oxygen slowly-diuresis with IV Lasix  Acute on chronic HFpEF Grossly volume overloaded IV Lasix Daily weights Strict intake/output  Critical limb ischemia-left toe gangrene S/p arteriogram on 1/17 with balloon angioplasty of ostial SFA lesion, proximal SFA lesion No obvious evidence of infection Await vascular surgery evaluation  Hypocalcemia Recent PTH levels appropriately elevated, vitamin D levels low Will start IV calcium gluconate, oral calcitriol/Os-Cal. Follow calcium levels closely.  CKD stage IV At baseline Follow-up electrolytes closely  Normocytic anemia Due to CKD No evidence of blood loss Follow CBC closely  DM-2 Resume Semglee 6 units/SSI Follow CBG trend  PAF Continue metoprolol/Eliquis   CAD Treated medically-continue Plavix/statin/beta-blocker No current anginal symptoms  HLD Statin  Seizure disorder Keppra  History of AML-in remission Follows with Dr. [name] as an outpatient  Palliative care Very frail DNR in place Multiple medical issues as above-second hospitalization within a week from discharge At risk for further decompensation Have placed palliative care consultation for goals of care-suspect may benefit from establishment of home hospice on discharge.   Advance Care Planning:   Code Status: DNR   Consults:  Vascular surgery Palliative care  Family Communication:  None at bedside  Severity of Illness: The appropriate patient status for this patient is INPATIENT. Inpatient status is judged to be reasonable and necessary in order to provide the required intensity of service to ensure the patient's safety. The patient's presenting symptoms, physical exam  findings, and initial radiographic and laboratory data in the context of their chronic  comorbidities is felt to place them at high risk for further clinical deterioration. Furthermore, it is not anticipated that the patient will be medically stable for discharge from the hospital within 2 midnights of admission.   * I certify that at the point of admission it is my clinical judgment that the patient will require inpatient hospital care spanning beyond 2 midnights from the point of admission due to high intensity of service, high risk for further deterioration and high frequency of surveillance required.*  Author: Oren Binet, MD 01/01/2023 8:16 AM  For on call review [email or web address].

## 2023-01-01 NOTE — Progress Notes (Signed)
ANTICOAGULATION CONSULT NOTE - Initial Consult  Pharmacy Consult for heparin  Indication: atrial fibrillation  Allergies  Allergen Reactions   Chlorhexidine Gluconate Itching and Rash    Patient Measurements: Height: '5\' 2"'$  (157.5 cm) Weight: 70.3 kg (155 lb) IBW/kg (Calculated) : 50.1 HEPARIN DW (KG): 64.9   Vital Signs: Temp: 97.3 F (36.3 C) (01/25 1346) Temp Source: Oral (01/25 1346) BP: 123/57 (01/25 1346) Pulse Rate: 77 (01/25 1346)  Labs: Recent Labs    12/31/22 1805  HGB 9.6*  HCT 30.8*  PLT 156  CREATININE 2.38*    Estimated Creatinine Clearance: 17 mL/min (A) (by C-G formula based on SCr of 2.38 mg/dL (H)).   Medical History: Past Medical History:  Diagnosis Date   Acute myelogenous leukemia ([place]) 02/2014   Acute pancreatitis    Arthritis    "back" (09/17/2017)   Atrial flutter (HCC)    Cardiomyopathy (HCC)    CHF (congestive heart failure) (HCC)    Chronic kidney disease, stage II (mild)    Chronic lower back pain    Colon polyps 04/29/2010   TUBULAR ADENOMA AND A SERRATED ADENOMA   COPD (chronic obstructive pulmonary disease) ([place])    CORONARY ARTERY DISEASE 12/24/2007   DIABETES MELLITUS, TYPE II 07/13/2007   History of blood transfusion 2015   "related to leukemia"   History of kidney stones 12/24/2007   History of uterine cancer    HYPERLIPIDEMIA 12/24/2007   HYPERTENSION 07/13/2007   NSTEMI (non-ST elevated myocardial infarction) ([place]) 09/17/2017   Uterine cancer ([place])      Assessment: Patient admitted to ED for shortness of breath and HFpEF exacerbation. Patient on Eliquis 2.5 mg PO BID PTA for afib. History of CKD IV. Presenting with ischemic limb. Primary team wanting to hold Eliquis for possible toe amputation. Pharmacy consulted to dose heparin.   Heparin to start at the time of next scheduled Eliquis dose. Will monitor with aPTTs since patient is on DOAC PTA. Hgb 9.6 and plt 156. No s/sx bleeding noted in chart.   Goal of  Therapy:  Heparin level 0.3-0.7 units/ml aPTT 66-102 seconds Monitor platelets by anticoagulation protocol: Yes   Plan:  No heparin bolus  Start heparin infusion at 900 units/hr later today at 2200  Will f/u heparin level/aPTT in 8 hours Monitor daily heparin level, aPTT, and CBC Continue to monitor H&H     Gena Fray, PharmD PGY1 Pharmacy Resident   01/01/2023 1:54 PM

## 2023-01-02 DIAGNOSIS — I739 Peripheral vascular disease, unspecified: Secondary | ICD-10-CM | POA: Diagnosis not present

## 2023-01-02 DIAGNOSIS — I96 Gangrene, not elsewhere classified: Secondary | ICD-10-CM

## 2023-01-02 DIAGNOSIS — I509 Heart failure, unspecified: Secondary | ICD-10-CM | POA: Diagnosis not present

## 2023-01-02 DIAGNOSIS — I5033 Acute on chronic diastolic (congestive) heart failure: Secondary | ICD-10-CM | POA: Diagnosis not present

## 2023-01-02 LAB — CBC
HCT: 25.7 % — ABNORMAL LOW (ref 36.0–46.0)
Hemoglobin: 8.2 g/dL — ABNORMAL LOW (ref 12.0–15.0)
MCH: 30.9 pg (ref 26.0–34.0)
MCHC: 31.9 g/dL (ref 30.0–36.0)
MCV: 97 fL (ref 80.0–100.0)
Platelets: 122 10*3/uL — ABNORMAL LOW (ref 150–400)
RBC: 2.65 MIL/uL — ABNORMAL LOW (ref 3.87–5.11)
RDW: 17.2 % — ABNORMAL HIGH (ref 11.5–15.5)
WBC: 7.5 10*3/uL (ref 4.0–10.5)
nRBC: 0 % (ref 0.0–0.2)

## 2023-01-02 LAB — RENAL FUNCTION PANEL
Albumin: 2.2 g/dL — ABNORMAL LOW (ref 3.5–5.0)
Anion gap: 9 (ref 5–15)
BUN: 80 mg/dL — ABNORMAL HIGH (ref 8–23)
CO2: 28 mmol/L (ref 22–32)
Calcium: 5.8 mg/dL — CL (ref 8.9–10.3)
Chloride: 104 mmol/L (ref 98–111)
Creatinine, Ser: 2.09 mg/dL — ABNORMAL HIGH (ref 0.44–1.00)
GFR, Estimated: 23 mL/min — ABNORMAL LOW (ref >60)
Glucose, Bld: 335 mg/dL — ABNORMAL HIGH (ref 70–99)
Phosphorus: 5.7 mg/dL — ABNORMAL HIGH (ref 2.5–4.6)
Potassium: 3.9 mmol/L (ref 3.5–5.1)
Sodium: 141 mmol/L (ref 135–145)

## 2023-01-02 LAB — GLUCOSE, CAPILLARY
Glucose-Capillary: 299 mg/dL — ABNORMAL HIGH (ref 70–99)
Glucose-Capillary: 230 mg/dL — ABNORMAL HIGH (ref 70–99)
Glucose-Capillary: 100 mg/dL — ABNORMAL HIGH (ref 70–99)
Glucose-Capillary: 131 mg/dL — ABNORMAL HIGH (ref 70–99)

## 2023-01-02 LAB — VAS US ABI WITH/WO TBI

## 2023-01-02 LAB — APTT
aPTT: 119 seconds — ABNORMAL HIGH (ref 24–36)
aPTT: 60 seconds — ABNORMAL HIGH (ref 24–36)

## 2023-01-02 LAB — MAGNESIUM: Magnesium: 1.5 mg/dL — ABNORMAL LOW (ref 1.7–2.4)

## 2023-01-02 LAB — HEPARIN LEVEL (UNFRACTIONATED): Heparin Unfractionated: 1.04 IU/mL — ABNORMAL HIGH (ref 0.30–0.70)

## 2023-01-02 MED ORDER — CALCITRIOL 0.5 MCG PO CAPS
0.5000 ug | ORAL_CAPSULE | Freq: Every day | ORAL | Status: DC
  Administered 2023-01-02 – 2023-01-09 (×7): 0.5 ug via ORAL
  Filled 2023-01-02 (×7): qty 1

## 2023-01-02 MED ORDER — CALCIUM CARBONATE 1250 (500 CA) MG PO TABS
1250.0000 mg | ORAL_TABLET | Freq: Three times a day (TID) | ORAL | Status: DC
  Administered 2023-01-02 – 2023-01-09 (×20): 1250 mg via ORAL
  Filled 2023-01-02 (×21): qty 1

## 2023-01-02 MED ORDER — INSULIN GLARGINE-YFGN 100 UNIT/ML ~~LOC~~ SOLN
20.0000 [IU] | Freq: Every day | SUBCUTANEOUS | Status: DC
  Administered 2023-01-02: 20 [IU] via SUBCUTANEOUS
  Filled 2023-01-02: qty 0.2

## 2023-01-02 MED ORDER — MAGNESIUM SULFATE 4 GM/100ML IV SOLN
4.0000 g | Freq: Once | INTRAVENOUS | Status: AC
  Administered 2023-01-02: 4 g via INTRAVENOUS
  Filled 2023-01-02: qty 100

## 2023-01-02 MED ORDER — LORAZEPAM 0.5 MG PO TABS
0.5000 mg | ORAL_TABLET | Freq: Four times a day (QID) | ORAL | Status: DC | PRN
  Administered 2023-01-02 – 2023-01-08 (×5): 0.5 mg via ORAL
  Filled 2023-01-02 (×5): qty 1

## 2023-01-02 NOTE — Consult Note (Signed)
PODIATRY CONSULTATION  NAME [name] MRN [id number] DOB 17-Apr-1941 DOA 12/31/2022   Reason for consult: Gangrene left great toe Chief Complaint  Patient presents with   Leg Swelling    Left    History of present illness: 82 y.o. female PMHx PAD w/ critical limb ischemia, CAD, CKDstageIV, T2DM admitted for worsening lower extremity edema, weakness, and hypoxia. Underwent abdominal aortogram w/ peripheral balloon angioplasty 12/24/2022. Podiatry consulted for management of gangrenous toe.   Past Medical History:  Diagnosis Date   Acute myelogenous leukemia ([place]) 02/2014   Acute pancreatitis    Arthritis    "back" (09/17/2017)   Atrial flutter (HCC)    Cardiomyopathy (HCC)    CHF (congestive heart failure) (HCC)    Chronic kidney disease, stage II (mild)    Chronic lower back pain    Colon polyps 04/29/2010   TUBULAR ADENOMA AND A SERRATED ADENOMA   COPD (chronic obstructive pulmonary disease) ([place])    CORONARY ARTERY DISEASE 12/24/2007   DIABETES MELLITUS, TYPE II 07/13/2007   History of blood transfusion 2015   "related to leukemia"   History of kidney stones 12/24/2007   History of uterine cancer    HYPERLIPIDEMIA 12/24/2007   HYPERTENSION 07/13/2007   NSTEMI (non-ST elevated myocardial infarction) ([place]) 09/17/2017   Uterine cancer (HCC)        Latest Ref Rng & Units 01/02/2023    7:12 AM 12/31/2022    6:05 PM 12/25/2022    6:40 PM  CBC  WBC 4.0 - 10.5 K/uL 7.5  5.8    Hemoglobin 12.0 - 15.0 g/dL 8.2  9.6  8.2   Hematocrit 36.0 - 46.0 % 25.7  30.8  25.9   Platelets 150 - 400 K/uL 122  156         Latest Ref Rng & Units 01/02/2023    5:56 AM 12/31/2022    6:05 PM 12/25/2022    6:10 AM  BMP  Glucose 70 - 99 mg/dL 335  244  295   BUN 8 - 23 mg/dL 80  85  76   Creatinine 0.44 - 1.00 mg/dL 2.09  2.38  2.43   Sodium 135 - 145 mmol/L 141  140  139   Potassium 3.5 - 5.1 mmol/L 3.9  4.0  4.1   Chloride 98 - 111 mmol/L 104  102  99   CO2 22 - 32 mmol/L '28  27   31   '$ Calcium 8.9 - 10.3 mg/dL 5.8  6.2  6.6       Physical Exam: General: The patient is alert and oriented x3 in no acute distress.   Dermatology: Skin is warm, dry and supple bilateral lower extremities. Negative for open lesions or macerations.  Vascular: ABDOMINAL AORTOGRAM W/LOWER EXTREMITY, PERIPHERAL BALLOON ANGIOPLASTY 12/24/2022 DR. J. ELI ROBINS, VVS Impression: Successful balloon angioplasty of ostial SFA lesion, proximal SFA lesion.  Two-vessel runoff to the level of the ankle with the posterior tibial artery tingling into the foot. Patient's left lower extremity blood flow has been optimized.  Neurological: Diminished via light touch  Musculoskeletal Exam: No prior amputations.    ASSESSMENT/PLAN OF CARE Ischemic gangrene left great toe - discussed plan for amputation left great toe tomorrow AM. Patient amenable to plan. Tried to reach out to her Sister but went to voicemail. - Orders placed for inpatient surgery left great toe amputation left.  NPO after midnight. Hold Heparin at midnight. Resume after surgery.  - Surgery tentatively scheduled for 10AM 01/02/2023 -  Will follow     Thank you for the consult.  Please contact me directly via secure chat with any questions or concerns.     Edrick Kins, DPM [facility]  Dr. Edrick Kins, DPM    [address], [place] [address]                Office [phone]  Fax [phone]

## 2023-01-02 NOTE — Progress Notes (Addendum)
ANTICOAGULATION CONSULT NOTE  Pharmacy Consult for heparin  Indication: atrial fibrillation  Allergies  Allergen Reactions   Chlorhexidine Gluconate Itching and Rash    Patient Measurements: Height: '5\' 2"'$  (157.5 cm) Weight: 70.2 kg (154 lb 12.2 oz) IBW/kg (Calculated) : 50.1 HEPARIN DW (KG): 64.9   Vital Signs: BP: 117/57 (01/26 1800) Pulse Rate: 74 (01/26 1800)  Labs: Recent Labs    12/31/22 1805 01/02/23 0556 01/02/23 0712 01/02/23 1823  HGB 9.6*  --  8.2*  --   HCT 30.8*  --  25.7*  --   PLT 156  --  122*  --   APTT  --  119*  --  60*  HEPARINUNFRC  --  1.04*  --   --   CREATININE 2.38* 2.09*  --   --      Estimated Creatinine Clearance: 19.4 mL/min (A) (by C-G formula based on SCr of 2.09 mg/dL (H)).  Assessment: Patient admitted to ED for shortness of breath and HFpEF exacerbation. Patient on Eliquis 2.5 mg PO BID PTA for afib. History of CKD IV. Presenting with ischemic limb. Eliquis on hold for toe amputation. Pharmacy consulted to dose heparin.   aPTT 60 (subtherapeutic) on heparin 750 units/hr. No signs of bleeding.  Goal of Therapy:  Heparin level 0.3-0.7 units/ml aPTT 66-102 seconds Monitor platelets by anticoagulation protocol: Yes   Plan:  Increase heparin to 800 units/hr Check aPTT and heparin level in 8 hours Monitor for signs/symptoms of bleeding  Addendum: Planning for amputation on 1/27 with podiatry. Heparin ordered to stop 1/27 at 0200 and restart at 1400. Will add stop time to current order. Day shift pharmacist to follow up on restarting after OR.  Erskine Speed, PharmD Clinical Pharmacist 01/02/2023 7:30 PM

## 2023-01-02 NOTE — Progress Notes (Addendum)
PROGRESS NOTE        PATIENT DETAILS Name: [name] Age: 82 y.o. Sex: female Date of Birth: 06-Jun-1941 Admit Date: 12/31/2022 Admitting Physician Evalee Mutton Kristeen Mans, MD GYJ:[id number] [name], MD  Brief Summary: Patient is a 82 y.o.  female with medical history significant of PAD with critical limb ischemia (dry gangrene of left great toe), chronic HFpEF, PAF on Eliquis, CAD (managed medically-Plavix/statin) CKD stage IV, DM-2, Keppra, history of AML (in remission)-sent from PCPs office for evaluation of worsening lower extremity edema/hypoxemia-found to have acute on chronic HFpEF and left leg critical limb ischemia with dry gangrene of left great toe.   Significant events: 12/30-1/18>> hospitalization for left lower limb critical ischemia-s/p angiogram with balloon angioplasty-treated for UTI/hypokalemia/AKI/HFpEF exacerbation.  Discharged home with home health.   1/25>> sent to ED by PCP-for SOB/worsening leg swelling-HFpEF exacerbation, dry gangrene of left great toe.  Significant studies: 1/1>>1, 25 vitamin D:17.2 (low) 1/1>>25 vitamin D:19.58 (low) 1/3>>PTH 307 (high) 1/25>> CXR: Pulmonary vascular congestion, small bilateral pleural effusions 1/25>> lower extremity arterial duplex: 50-74% stenosis left superficial femoral artery, left popliteal artery and left posterior tibial artery    Significant microbiology data: 1/24>> blood culture: No growth 1/25>> COVID/influenza/RSV PCR: Negative  Procedures: None  Consults: Vascular surgery Podiatry  Subjective: Lying comfortably in bed-denies any chest pain or shortness of breath.  Some decrease in bilateral lower leg edema.  Left great toe gangrene unchanged.  Objective: Vitals: Blood pressure 136/70, pulse 88, temperature (!) 97.2 F (36.2 C), temperature source Oral, resp. rate 14, height '5\' 2"'$  (1.575 m), weight 70.2 kg, SpO2 91 %.   Exam: Gen Exam:Alert awake-not in any  distress HEENT:atraumatic, normocephalic Chest: B/L clear to auscultation anteriorly CVS:S1S2 regular Abdomen:soft non tender, non distended Extremities:++ edema.  Left great toe gangrene-dry-no discharge/erythema.. Neurology: Non focal-generalized weakness Skin: no rash  Pertinent Labs/Radiology:    Latest Ref Rng & Units 01/02/2023    7:12 AM 12/31/2022    6:05 PM 12/25/2022    6:40 PM  CBC  WBC 4.0 - 10.5 K/uL 7.5  5.8    Hemoglobin 12.0 - 15.0 g/dL 8.2  9.6  8.2   Hematocrit 36.0 - 46.0 % 25.7  30.8  25.9   Platelets 150 - 400 K/uL 122  156      Lab Results  Component Value Date   NA 141 01/02/2023   K 3.9 01/02/2023   CL 104 01/02/2023   CO2 28 01/02/2023      Assessment/Plan: Acute hypoxic respiratory failure Due to HFpEF exacerbation Remains on IV Lasix On minimal amount of oxygen-titrate to room air over the next few days.   Acute on chronic HFpEF Lower extremity edema better but still volume overloaded Continue IV Lasix 80 mg twice daily Discussed with nursing staff regarding strict intake/output.   Follow daily weights/electrolytes   Critical limb ischemia-left toe gangrene S/p arteriogram on 1/17 with balloon angioplasty of ostial SFA lesion, proximal SFA lesion Arterial Doppler on 1/25 showing around 50-74 stenosis of left SFA/popliteal artery/posterior tibial artery Vascular surgery/podiatry following will await further recommendations Podiatry tentatively planning left great toe amputation on 1/27 Left great toe dry gangrene appears unchanged-no evidence of infection-no role for antibiotics.   Hypocalcemia  Recent PTH levels appropriately elevated, vitamin D levels low Just started on calcitriol/Os-Cal yesterday-will increase calcitriol to 0.5 mcg today, change  Os-Cal to 3 times daily dosing Discussed with nephrologist-Dr. Gean Quint on 1/25-if levels do not appropriately increase over the next several days-we will formally consult nephrology. Repeat  electrolytes tomorrow.  Hypomagnesemia Replete/recheck Repletion should help improve calcium levels as well.   CKD stage IV At baseline Follow- electrolytes closely   Normocytic anemia Due to CKD No evidence of blood loss Follow CBC closely   DM-2 (A1c 9.9 on 08/13/2022) CBGs uncontrolled-increase Semglee to 20 units-continue SSI Follow CBG trend and adjust accordingly  Recent Labs    01/01/23 1800 01/01/23 2112 01/02/23 0618  GLUCAP 194* 307* 299*      PAF Continue metoprolol Eliquis on hold-on IV heparin-in anticipation of left great toe amputation over the weekend.    CAD Treated medically-continue Plavix/statin/beta-blocker No current anginal symptoms   HLD Statin   Seizure disorder Keppra   History of AML-in remission Follows with Dr. [name] as an outpatient   Palliative care Very frail DNR in place Multiple medical issues as above-second hospitalization within a week from discharge At risk for further decompensation Have placed palliative care consultation for goals of care-suspect may benefit from establishment of home hospice on discharge.   BMI: Estimated body mass index is 28.31 kg/m as calculated from the following:   Height as of this encounter: '5\' 2"'$  (1.575 m).   Weight as of this encounter: 70.2 kg.   Code status:   Code Status: DNR   DVT Prophylaxis: IV heparin  Family Communication: Daughter-Penny-[phone]-left VM 1/26   Disposition Plan: Status is: Inpatient Remains inpatient appropriate because: Severity of illness.   Planned Discharge Destination:Home   Diet: Diet Order             Diet heart healthy/carb modified Room service appropriate? Yes; Fluid consistency: Thin; Fluid restriction: 1500 mL Fluid  Diet effective now                     Antimicrobial agents: Anti-infectives (From admission, onward)    None        MEDICATIONS: Scheduled Meds:  ascorbic acid  1,000 mg Oral Daily   atorvastatin   40 mg Oral Daily   calcitRIOL  0.5 mcg Oral Daily   calcium acetate  1,334 mg Oral TID WC   calcium carbonate  1,250 mg Oral TID WC   clopidogrel  75 mg Oral QPM   fluticasone furoate-vilanterol  1 puff Inhalation Daily   furosemide  80 mg Intravenous Q12H   insulin aspart  0-9 Units Subcutaneous TID WC   insulin glargine-yfgn  16 Units Subcutaneous QHS   isosorbide mononitrate  30 mg Oral Daily   levETIRAcetam  500 mg Oral BID   melatonin  3 mg Oral QHS   metoprolol succinate  12.5 mg Oral Daily   polyethylene glycol  17 g Oral Daily   potassium chloride  20 mEq Oral Daily   sodium chloride flush  3 mL Intravenous Q12H   Continuous Infusions:  sodium chloride     heparin 750 Units/hr (01/02/23 0756)   PRN Meds:.sodium chloride, acetaminophen **OR** acetaminophen, albuterol, hydrALAZINE, LORazepam, nitroGLYCERIN, ondansetron **OR** ondansetron (ZOFRAN) IV, oxyCODONE-acetaminophen, sodium chloride flush   I have personally reviewed following labs and imaging studies  LABORATORY DATA: CBC: Recent Labs  Lab 12/31/22 1805 01/02/23 0712  WBC 5.8 7.5  NEUTROABS 4.6  --   HGB 9.6* 8.2*  HCT 30.8* 25.7*  MCV 99.7 97.0  PLT 156 122*    Basic Metabolic Panel: Recent Labs  Lab 12/31/22 1805 01/02/23 0556  NA 140 141  K 4.0 3.9  CL 102 104  CO2 27 28  GLUCOSE 244* 335*  BUN 85* 80*  CREATININE 2.38* 2.09*  CALCIUM 6.2* 5.8*  MG  --  1.5*  PHOS  --  5.7*    GFR: Estimated Creatinine Clearance: 19.4 mL/min (A) (by C-G formula based on SCr of 2.09 mg/dL (H)).  Liver Function Tests: Recent Labs  Lab 01/02/23 0556  ALBUMIN 2.2*   No results for input(s): "LIPASE", "AMYLASE" in the last 168 hours. No results for input(s): "AMMONIA" in the last 168 hours.  Coagulation Profile: No results for input(s): "INR", "PROTIME" in the last 168 hours.  Cardiac Enzymes: No results for input(s): "CKTOTAL", "CKMB", "CKMBINDEX", "TROPONINI" in the last 168 hours.  BNP (last 3  results) No results for input(s): "PROBNP" in the last 8760 hours.  Lipid Profile: No results for input(s): "CHOL", "HDL", "LDLCALC", "TRIG", "CHOLHDL", "LDLDIRECT" in the last 72 hours.  Thyroid Function Tests: No results for input(s): "TSH", "T4TOTAL", "FREET4", "T3FREE", "THYROIDAB" in the last 72 hours.  Anemia Panel: No results for input(s): "VITAMINB12", "FOLATE", "FERRITIN", "TIBC", "IRON", "RETICCTPCT" in the last 72 hours.  Urine analysis:    Component Value Date/Time   COLORURINE YELLOW 12/07/2022 0019   APPEARANCEUR CLOUDY (A) 12/07/2022 0019   LABSPEC 1.013 12/07/2022 0019   LABSPEC 1.015 08/04/2014 1439   PHURINE 5.0 12/07/2022 0019   GLUCOSEU NEGATIVE 12/07/2022 0019   GLUCOSEU Negative 08/04/2014 1439   HGBUR SMALL (A) 12/07/2022 0019   HGBUR negative 10/10/2010 1607   BILIRUBINUR NEGATIVE 12/07/2022 0019   BILIRUBINUR Negative 08/04/2014 1439   KETONESUR NEGATIVE 12/07/2022 0019   PROTEINUR 30 (A) 12/07/2022 0019   UROBILINOGEN 0.2 08/04/2014 1439   NITRITE NEGATIVE 12/07/2022 0019   LEUKOCYTESUR LARGE (A) 12/07/2022 0019   LEUKOCYTESUR Negative 08/04/2014 1439    Sepsis Labs: Lactic Acid, Venous    Component Value Date/Time   LATICACIDVEN 0.7 01/01/2023 0205    MICROBIOLOGY: Recent Results (from the past 240 hour(s))  Culture, blood (routine x 2)     Status: None (Preliminary result)   Collection Time: 12/31/22  6:05 PM   Specimen: BLOOD  Result Value Ref Range Status   Specimen Description BLOOD RIGHT ANTECUBITAL  Final   Special Requests   Final    BOTTLES DRAWN AEROBIC AND ANAEROBIC Blood Culture adequate volume   Culture   Final    NO GROWTH < 12 HOURS Performed at [facility], [place] [address] [place], [place] [address]    Report Status PENDING  Incomplete  Resp panel by RT-PCR (RSV, Flu A&B, Covid) Anterior Nasal Swab     Status: None   Collection Time: 01/01/23  4:27 AM   Specimen: Anterior Nasal Swab  Result Value Ref Range  Status   SARS Coronavirus 2 by RT PCR NEGATIVE NEGATIVE Final    Comment: (NOTE) SARS-CoV-2 target nucleic acids are NOT DETECTED.  The SARS-CoV-2 RNA is generally detectable in upper respiratory specimens during the acute phase of infection. The lowest concentration of SARS-CoV-2 viral copies this assay can detect is 138 copies/mL. A negative result does not preclude SARS-Cov-2 infection and should not be used as the sole basis for treatment or other patient management decisions. A negative result may occur with  improper specimen collection/handling, submission of specimen other than nasopharyngeal swab, presence of viral mutation(s) within the areas targeted by this assay, and inadequate number of viral copies(<138 copies/mL). A negative  result must be combined with clinical observations, patient history, and epidemiological information. The expected result is Negative.  Fact Sheet for Patients:  EntrepreneurPulse.com.au  Fact Sheet for Healthcare Providers:  IncredibleEmployment.be  This test is no t yet approved or cleared by the [place] FDA and  has been authorized for detection and/or diagnosis of SARS-CoV-2 by FDA under an Emergency Use Authorization (EUA). This EUA will remain  in effect (meaning this test can be used) for the duration of the COVID-19 declaration under Section 564(b)(1) of the Act, 21 U.S.C.section 360bbb-3(b)(1), unless the authorization is terminated  or revoked sooner.       Influenza A by PCR NEGATIVE NEGATIVE Final   Influenza B by PCR NEGATIVE NEGATIVE Final    Comment: (NOTE) The Xpert Xpress SARS-CoV-2/FLU/RSV plus assay is intended as an aid in the diagnosis of influenza from Nasopharyngeal swab specimens and should not be used as a sole basis for treatment. Nasal washings and aspirates are unacceptable for Xpert Xpress SARS-CoV-2/FLU/RSV testing.  Fact Sheet for  Patients: EntrepreneurPulse.com.au  Fact Sheet for Healthcare Providers: IncredibleEmployment.be  This test is not yet approved or cleared by the [place] FDA and has been authorized for detection and/or diagnosis of SARS-CoV-2 by FDA under an Emergency Use Authorization (EUA). This EUA will remain in effect (meaning this test can be used) for the duration of the COVID-19 declaration under Section 564(b)(1) of the Act, 21 U.S.C. section 360bbb-3(b)(1), unless the authorization is terminated or revoked.     Resp Syncytial Virus by PCR NEGATIVE NEGATIVE Final    Comment: (NOTE) Fact Sheet for Patients: EntrepreneurPulse.com.au  Fact Sheet for Healthcare Providers: IncredibleEmployment.be  This test is not yet approved or cleared by the [place] FDA and has been authorized for detection and/or diagnosis of SARS-CoV-2 by FDA under an Emergency Use Authorization (EUA). This EUA will remain in effect (meaning this test can be used) for the duration of the COVID-19 declaration under Section 564(b)(1) of the Act, 21 U.S.C. section 360bbb-3(b)(1), unless the authorization is terminated or revoked.  Performed at [facility], [place] [address] [place], [place] [address]     RADIOLOGY STUDIES/RESULTS: VAS [place] ABI WITH/WO TBI  Result Date: 01/01/2023  LOWER EXTREMITY DOPPLER STUDY Patient Name:  [name]  Date of Exam:   01/01/2023 Medical Rec #: [id number]      Accession #:    [id number] Date of Birth: [birth year]      Patient Gender: F Patient Age:   62 years Exam Location:  [facility] Procedure:      VAS [place] ABI WITH/WO TBI Referring Phys: EMMA COLLINS --------------------------------------------------------------------------------  Indications: Gangrene. High Risk Factors: Hypertension, hyperlipidemia, Diabetes, past history of                    smoking, prior MI, coronary artery disease. Other  Factors: CHF, CKD, PAD.  Vascular Interventions: 12/24/2022 LLE balloon angioplastry of SFA. Comparison Study: Previous exam 12/11/2022 Performing Technologist: Rogelia Rohrer RVT, RDMS Supporting Technologist: Darlin Coco RDMS, RVT  Examination Guidelines: A complete evaluation includes at minimum, Doppler waveform signals and systolic blood pressure reading at the level of bilateral brachial, anterior tibial, and posterior tibial arteries, when vessel segments are accessible. Bilateral testing is considered an integral part of a complete examination. Photoelectric Plethysmograph (PPG) waveforms and toe systolic pressure readings are included as required and additional duplex testing as needed. Limited examinations for reoccurring indications may be performed as noted.  ABI Findings: +---------+------------------+-----+----------+--------+  Right    Rt Pressure (mmHg)IndexWaveform  Comment  +---------+------------------+-----+----------+--------+ Brachial 123                    triphasic          +---------+------------------+-----+----------+--------+ PTA                             monophasic         +---------+------------------+-----+----------+--------+ DP                              monophasic         +---------+------------------+-----+----------+--------+ Great Toe64                0.50 Abnormal           +---------+------------------+-----+----------+--------+ +---------+------------------+-----+----------+-------+ Left     Lt Pressure (mmHg)IndexWaveform  Comment +---------+------------------+-----+----------+-------+ Brachial 129                    triphasic         +---------+------------------+-----+----------+-------+ PTA                             monophasic        +---------+------------------+-----+----------+-------+ DP                              monophasic        +---------+------------------+-----+----------+-------+ Great Toe0                  0.00 Absent            +---------+------------------+-----+----------+-------+ +-------+-----------+-----------+------------+------------+ ABI/TBIToday's ABIToday's TBIPrevious ABIPrevious TBI +-------+-----------+-----------+------------+------------+ Right  [place]         0.50       [place]          0.26         +-------+-----------+-----------+------------+------------+ Left            Absent     0.95                     +-------+-----------+-----------+------------+------------+  Summary: Right: Resting right ankle-brachial index indicates noncompressible right lower extremity arteries. The right toe-brachial index is abnormal. Left: Resting left ankle-brachial index indicates noncompressible left lower extremity arteries. The left toe-brachial index is absent. *See table(s) above for measurements and observations.     Preliminary    VAS [place] LOWER EXTREMITY ARTERIAL DUPLEX  Result Date: 01/01/2023 LOWER EXTREMITY ARTERIAL DUPLEX STUDY Patient Name:  [name]  Date of Exam:   01/01/2023 Medical Rec #: [id number]      Accession #:    [id number] Date of Birth: 10/02/1941      Patient Gender: F Patient Age:   56 years Exam [facility] Procedure:      VAS [place] LOWER EXTREMITY ARTERIAL DUPLEX Referring Phys: EMMA COLLINS --------------------------------------------------------------------------------   Current ABI: Non-compressible bilaterally Performing Technologist: Darlin Coco RDMS, RVT  Examination Guidelines: A complete evaluation includes B-mode imaging, spectral Doppler, color Doppler, and power Doppler as needed of all accessible portions of each vessel. Bilateral testing is considered an integral part of a complete examination. Limited examinations for reoccurring indications may be performed as noted.   +-----------+--------+-----+--------------+------------------+-----------------+ LEFT       PSV cm/sRatioStenosis      Waveform  Comments           +-----------+--------+-----+--------------+------------------+-----------------+ CFA Mid    148                        biphasic          Turbulent         +-----------+--------+-----+--------------+------------------+-----------------+ DFA        71                         monophasic                          +-----------+--------+-----+--------------+------------------+-----------------+ SFA Prox   170     2:1  50-74%        monophasic                                                  stenosis                                          +-----------+--------+-----+--------------+------------------+-----------------+ SFA Mid    80>157  2:1  50-74%        monophasic        Focal elevation                           stenosis                        of 157            +-----------+--------+-----+--------------+------------------+-----------------+ SFA Distal 54                                                             +-----------+--------+-----+--------------+------------------+-----------------+ POP Prox   111                        monophasic                          +-----------+--------+-----+--------------+------------------+-----------------+ POP Mid    274     2:1  50-74%        monophasic                                                  stenosis                                          +-----------+--------+-----+--------------+------------------+-----------------+ POP Distal 79                         monophasic                          +-----------+--------+-----+--------------+------------------+-----------------+  TP Trunk   65                         monophasic                          +-----------+--------+-----+--------------+------------------+-----------------+ ATA Prox   44                         monophasic                          +-----------+--------+-----+--------------+------------------+-----------------+ ATA  Mid    11                         dampened                                                                  monophasic                          +-----------+--------+-----+--------------+------------------+-----------------+ ATA Distal 28                         monophasic                          +-----------+--------+-----+--------------+------------------+-----------------+ PTA Prox   39                         monophasic                          +-----------+--------+-----+--------------+------------------+-----------------+ PTA Mid    90      2:1  50-74%        monophasic                                                  stenosis                                          +-----------+--------+-----+--------------+------------------+-----------------+ PTA Distal 70                         monophasic                          +-----------+--------+-----+--------------+------------------+-----------------+ PERO Prox  32                         monophasic                          +-----------+--------+-----+--------------+------------------+-----------------+ PERO Mid   32                         monophasic                          +-----------+--------+-----+--------------+------------------+-----------------+  PERO Distal28                         dampened                                                                  monophasic                          +-----------+--------+-----+--------------+------------------+-----------------+  Summary: Left: Multi-focal 50-74% stenosis noted in the superficial femoral artery. 50-74% stenosis noted in the popliteal artery. 50-74% stenosis noted in the posterior tibial artery. Bilateral: Abnormal common femoral waveforms bilaterally suggestive of inflow disease.  See table(s) above for measurements and observations.    Preliminary    DG Chest Portable 1 View  Result Date: 01/01/2023 CLINICAL DATA:   Shortness of breath and leg swelling. EXAM: PORTABLE CHEST 1 VIEW COMPARISON:  December 24, 2022 FINDINGS: There is stable right-sided venous Port-A-Cath positioning. The cardiac silhouette is mildly enlarged and unchanged in size. There is marked severity calcification of the aortic arch. Mild prominence of the bilateral perihilar pulmonary vasculature is noted. Mild atelectasis is seen within the bilateral lung bases. Small bilateral pleural effusions are seen. No pneumothorax is identified. Radiopaque surgical clips are seen in the soft tissues of the left breast. The visualized skeletal structures are unremarkable. IMPRESSION: 1. Stable cardiomegaly with mild pulmonary vascular congestion. 2. Mild bibasilar atelectasis and small bilateral pleural effusions. Electronically Signed   By: Virgina Norfolk M.D.   On: 01/01/2023 01:25   DG Foot Complete Left  Result Date: 12/31/2022 CLINICAL DATA:  Worsening left foot wounds. EXAM: LEFT FOOT - COMPLETE 3+ VIEW COMPARISON:  None Available. FINDINGS: There is no evidence of an acute fracture or dislocation. Diffuse osteopenia is noted without areas of acute cortical destruction. There is a moderate to large plantar calcaneal spur with mild degenerative changes seen within the mid left foot. Moderate severity diffuse soft tissue swelling is seen, more prominent along the dorsal aspect of the left foot. IMPRESSION: Moderate severity diffuse soft tissue swelling without evidence of acute osteomyelitis. MRI correlation is recommended if acute osteomyelitis remains of clinical concern. Electronically Signed   By: Virgina Norfolk M.D.   On: 12/31/2022 19:16     LOS: 1 day   Oren Binet, MD  [facility]    To contact the attending provider between 7A-7P or the covering provider during after hours 7P-7A, please log into the web site [email or web address] and access using universal [place] password for that web site. If you do not have the password, please  call the hospital operator.  01/02/2023, 9:19 AM

## 2023-01-02 NOTE — Progress Notes (Signed)
ANTICOAGULATION CONSULT NOTE  Pharmacy Consult for heparin  Indication: atrial fibrillation Brief A/P: aPTT subtherapeutic Decrease Heparin rate Allergies  Allergen Reactions   Chlorhexidine Gluconate Itching and Rash    Patient Measurements: Height: '5\' 2"'$  (157.5 cm) Weight: 70.2 kg (154 lb 12.2 oz) IBW/kg (Calculated) : 50.1 HEPARIN DW (KG): 64.9   Vital Signs: Temp: 98.2 F (36.8 C) (01/25 1957) Temp Source: Oral (01/25 1957) BP: 100/74 (01/25 1957) Pulse Rate: 88 (01/25 1957)  Labs: Recent Labs    12/31/22 1805 01/02/23 0556  HGB 9.6*  --   HCT 30.8*  --   PLT 156  --   APTT  --  119*  HEPARINUNFRC  --  1.04*  CREATININE 2.38* 2.09*     Estimated Creatinine Clearance: 19.4 mL/min (A) (by C-G formula based on SCr of 2.09 mg/dL (H)).  Assessment: 82 y.o. female admitted with CHF exacerbation, h/o Afib and Eliquis on hold, for heparin  Goal of Therapy:  Heparin level 0.3-0.7 units/ml aPTT 66-102 seconds Monitor platelets by anticoagulation protocol: Yes   Plan:  Decrease Heparin 750 units/hr Check aPTT in 8 hours   Phillis Knack, PharmD, BCPS  01/02/2023 7:01 AM

## 2023-01-02 NOTE — Treatment Plan (Signed)
Attempted to see patient for consultation after discussing her case she was unavailable and off the floor for testing.  I discussed the gangrene with her Sister [name] who was present.  Dr. Amalia Hailey will see tomorrow for formal consultation.  Tentative plan for surgery Saturday a.m. once Eliquis has metabolized  Lanae Crumbly, DPM 01/02/2023

## 2023-01-02 NOTE — H&P (View-Only) (Signed)
PODIATRY CONSULTATION  NAME [name] MRN [id number] DOB Apr 07, 1941 DOA 12/31/2022   Reason for consult: Gangrene left great toe Chief Complaint  Patient presents with   Leg Swelling    Left    History of present illness: 82 y.o. female PMHx PAD w/ critical limb ischemia, CAD, CKDstageIV, T2DM admitted for worsening lower extremity edema, weakness, and hypoxia. Underwent abdominal aortogram w/ peripheral balloon angioplasty 12/24/2022. Podiatry consulted for management of gangrenous toe.   Past Medical History:  Diagnosis Date   Acute myelogenous leukemia ([place]) 02/2014   Acute pancreatitis    Arthritis    "back" (09/17/2017)   Atrial flutter (HCC)    Cardiomyopathy (HCC)    CHF (congestive heart failure) (HCC)    Chronic kidney disease, stage II (mild)    Chronic lower back pain    Colon polyps 04/29/2010   TUBULAR ADENOMA AND A SERRATED ADENOMA   COPD (chronic obstructive pulmonary disease) ([place])    CORONARY ARTERY DISEASE 12/24/2007   DIABETES MELLITUS, TYPE II 07/13/2007   History of blood transfusion 2015   "related to leukemia"   History of kidney stones 12/24/2007   History of uterine cancer    HYPERLIPIDEMIA 12/24/2007   HYPERTENSION 07/13/2007   NSTEMI (non-ST elevated myocardial infarction) ([place]) 09/17/2017   Uterine cancer (HCC)        Latest Ref Rng & Units 01/02/2023    7:12 AM 12/31/2022    6:05 PM 12/25/2022    6:40 PM  CBC  WBC 4.0 - 10.5 K/uL 7.5  5.8    Hemoglobin 12.0 - 15.0 g/dL 8.2  9.6  8.2   Hematocrit 36.0 - 46.0 % 25.7  30.8  25.9   Platelets 150 - 400 K/uL 122  156         Latest Ref Rng & Units 01/02/2023    5:56 AM 12/31/2022    6:05 PM 12/25/2022    6:10 AM  BMP  Glucose 70 - 99 mg/dL 335  244  295   BUN 8 - 23 mg/dL 80  85  76   Creatinine 0.44 - 1.00 mg/dL 2.09  2.38  2.43   Sodium 135 - 145 mmol/L 141  140  139   Potassium 3.5 - 5.1 mmol/L 3.9  4.0  4.1   Chloride 98 - 111 mmol/L 104  102  99   CO2 22 - 32 mmol/L '28  27   31   '$ Calcium 8.9 - 10.3 mg/dL 5.8  6.2  6.6       Physical Exam: General: The patient is alert and oriented x3 in no acute distress.   Dermatology: Skin is warm, dry and supple bilateral lower extremities. Negative for open lesions or macerations.  Vascular: ABDOMINAL AORTOGRAM W/LOWER EXTREMITY, PERIPHERAL BALLOON ANGIOPLASTY 12/24/2022 DR. J. ELI ROBINS, VVS Impression: Successful balloon angioplasty of ostial SFA lesion, proximal SFA lesion.  Two-vessel runoff to the level of the ankle with the posterior tibial artery tingling into the foot. Patient's left lower extremity blood flow has been optimized.  Neurological: Diminished via light touch  Musculoskeletal Exam: No prior amputations.    ASSESSMENT/PLAN OF CARE Ischemic gangrene left great toe - discussed plan for amputation left great toe tomorrow AM. Patient amenable to plan. Tried to reach out to her Sister but went to voicemail. - Orders placed for inpatient surgery left great toe amputation left.  NPO after midnight. Hold Heparin at midnight. Resume after surgery.  - Surgery tentatively scheduled for 10AM 01/02/2023 -  Will follow     Thank you for the consult.  Please contact me directly via secure chat with any questions or concerns.     Edrick Kins, DPM [facility]  Dr. Edrick Kins, DPM    [address], [place] [address]                Office [phone]  Fax [phone]

## 2023-01-02 NOTE — Progress Notes (Addendum)
Progress Note    01/02/2023 7:55 AM Hospital Day 1  Subjective:  sleeping; denies any pain in the left foot  afebrile  Vitals:   01/01/23 1957 01/02/23 0724  BP: 100/74 136/70  Pulse: 88   Resp: 20 14  Temp: 98.2 F (36.8 C) (!) 97.2 F (36.2 C)  SpO2: 91%     Physical Exam: General:  no distress; wakes easily Lungs:  non labored Extremities:  bilateral feet are equally warm with motor and sensory in tact.  Left gangrene of great toe appears stable from yesterday.  CBC    Component Value Date/Time   WBC 7.5 01/02/2023 0712   RBC 2.65 (L) 01/02/2023 0712   HGB 8.2 (L) 01/02/2023 0712   HGB 9.1 (L) 11/10/2022 1412   HGB 11.2 10/06/2017 1632   HGB 9.6 (L) 09/04/2017 1344   HCT 25.7 (L) 01/02/2023 0712   HCT 35.6 10/06/2017 1632   HCT 29.3 (L) 09/04/2017 1344   PLT 122 (L) 01/02/2023 0712   PLT 181 11/10/2022 1412   PLT 213 10/06/2017 1632   MCV 97.0 01/02/2023 0712   MCV 93 10/06/2017 1632   MCV 95.6 09/04/2017 1344   MCH 30.9 01/02/2023 0712   MCHC 31.9 01/02/2023 0712   RDW 17.2 (H) 01/02/2023 0712   RDW 15.1 10/06/2017 1632   RDW 14.7 (H) 09/04/2017 1344   LYMPHSABS 0.8 12/31/2022 1805   LYMPHSABS 1.4 09/04/2017 1344   MONOABS 0.4 12/31/2022 1805   MONOABS 0.6 09/04/2017 1344   EOSABS 0.0 12/31/2022 1805   EOSABS 0.1 09/04/2017 1344   BASOSABS 0.0 12/31/2022 1805   BASOSABS 0.0 09/04/2017 1344    BMET    Component Value Date/Time   NA 141 01/02/2023 0556   NA 147 (H) 05/19/2022 1605   NA 144 09/04/2017 1436   K 3.9 01/02/2023 0556   K 4.1 09/04/2017 1436   CL 104 01/02/2023 0556   CO2 28 01/02/2023 0556   CO2 26 09/04/2017 1436   GLUCOSE 335 (H) 01/02/2023 0556   GLUCOSE 152 (H) 09/04/2017 1436   BUN 80 (H) 01/02/2023 0556   BUN 26 05/19/2022 1605   BUN 20.6 09/04/2017 1436   CREATININE 2.09 (H) 01/02/2023 0556   CREATININE 1.53 (H) 01/22/2022 1457   CREATININE 1.4 (H) 09/04/2017 1436   CALCIUM 5.8 (LL) 01/02/2023 0556   CALCIUM 9.4  09/04/2017 1436   GFRNONAA 23 (L) 01/02/2023 0556   GFRNONAA 34 (L) 01/22/2022 1457   GFRAA 39 (L) 10/04/2020 1404   GFRAA 39 (L) 03/29/2019 1459    INR    Component Value Date/Time   INR 1.1 12/08/2021 1251   INR 1.10 (L) 02/29/2016 1057     Intake/Output Summary (Last 24 hours) at 01/02/2023 0755 Last data filed at 01/02/2023 0300 Gross per 24 hour  Intake 570.61 ml  Output 200 ml  Net 370.61 ml     Assessment/Plan:  82 y.o. female s/p angiogram with balloon angioplasty left SFA 12/24/2022 by Dr. Virl Cagey with worsening left great toe gangrene  Hospital Day 1  -pt left great toe gangrene stable from pictures yesterday.  Bilateral feet are equally warm with sensation and motor in tact.   -ABI [place] and LLE duplex reveals 50-75% stenosis in the SFA and popliteal  and PTA.   MD to review studies.   -recommend podiatry consult as well.    Leontine Locket, PA-C [facility] [phone] 01/02/2023 7:55 AM   I have independently interviewed and examined patient  and agree with PA assessment and plan above.  Duplex and ABIs reviewed appears to have restenosis of the left SFA and will need likely stenting on Monday.  Should be okay for toe amputation tomorrow.  Crystalmarie Yasin C. Donzetta Matters, MD [facility] [place] Office: [phone] Pager: [phone]

## 2023-01-02 NOTE — Progress Notes (Signed)
Heart Failure Navigator Progress Note  Assessed for Heart & Vascular TOC clinic readiness.  Patient does not meet criteria due to Palliative and possible hospice at discharge. Functional decline, EF 60-65%, .   Navigator will sign off at this time. Earnestine Leys, BSN, RN Heart Failure Transport planner Only

## 2023-01-02 NOTE — TOC Progression Note (Signed)
Transition of Care [facility]) - Progression Note    Patient Details  Name: [name] MRN: [id number] Date of Birth: Feb 27, 1941  Transition of [facility]) CM/SW Contact  Zenon Mayo, RN Phone Number: 01/02/2023, 8:55 AM  Clinical Narrative:    From home , presents with acute hypoxic resp failure, acute \ chronic HF, critical limb ischemia, hypocalcemia, CKD 4, consult placed to vascular.  She was recently active with [facility] for [facility], [address], [place]. TOC following.        Expected Discharge Plan and Services                                               Social Determinants of Health (SDOH) Interventions SDOH Screenings   Food Insecurity: No Food Insecurity (12/29/2022)  Housing: Low Risk  (12/08/2022)  Transportation Needs: No Transportation Needs (12/29/2022)  Utilities: Not At Risk (12/08/2022)  Alcohol Screen: Low Risk  (10/22/2022)  Depression (PHQ2-9): Low Risk  (10/03/2022)  Financial Resource Strain: Low Risk  (10/22/2022)  Stress: No Stress Concern Present (05/12/2022)  Tobacco Use: Medium Risk (12/31/2022)    Readmission Risk Interventions    12/09/2022    4:35 PM  Readmission Risk Prevention Plan  Transportation Screening Complete  Medication Review (RN Care Manager) Complete  HRI or [employer] Complete  SW Recovery Care/Counseling Consult Complete  Palliative Care Screening Not [place] Not Applicable

## 2023-01-03 ENCOUNTER — Encounter (HOSPITAL_COMMUNITY)
Admission: EM | Disposition: A | Discharge status: Skilled Nursing Facility | Payer: Self-pay | Source: Ambulatory Visit | Attending: Internal Medicine

## 2023-01-03 ENCOUNTER — Other Ambulatory Visit: Payer: Self-pay

## 2023-01-03 ENCOUNTER — Inpatient Hospital Stay (HOSPITAL_COMMUNITY): Payer: Medicare HMO

## 2023-01-03 ENCOUNTER — Inpatient Hospital Stay (HOSPITAL_COMMUNITY): Payer: Medicare HMO | Admitting: Certified Registered"

## 2023-01-03 ENCOUNTER — Encounter (HOSPITAL_COMMUNITY): Payer: Self-pay | Admitting: Internal Medicine

## 2023-01-03 DIAGNOSIS — I509 Heart failure, unspecified: Secondary | ICD-10-CM | POA: Diagnosis not present

## 2023-01-03 DIAGNOSIS — N189 Chronic kidney disease, unspecified: Secondary | ICD-10-CM

## 2023-01-03 DIAGNOSIS — E1152 Type 2 diabetes mellitus with diabetic peripheral angiopathy with gangrene: Secondary | ICD-10-CM

## 2023-01-03 DIAGNOSIS — I2510 Atherosclerotic heart disease of native coronary artery without angina pectoris: Secondary | ICD-10-CM | POA: Diagnosis not present

## 2023-01-03 DIAGNOSIS — E1122 Type 2 diabetes mellitus with diabetic chronic kidney disease: Secondary | ICD-10-CM

## 2023-01-03 DIAGNOSIS — I96 Gangrene, not elsewhere classified: Secondary | ICD-10-CM

## 2023-01-03 DIAGNOSIS — Z87891 Personal history of nicotine dependence: Secondary | ICD-10-CM

## 2023-01-03 DIAGNOSIS — I130 Hypertensive heart and chronic kidney disease with heart failure and stage 1 through stage 4 chronic kidney disease, or unspecified chronic kidney disease: Secondary | ICD-10-CM | POA: Diagnosis not present

## 2023-01-03 DIAGNOSIS — D631 Anemia in chronic kidney disease: Secondary | ICD-10-CM

## 2023-01-03 DIAGNOSIS — I5033 Acute on chronic diastolic (congestive) heart failure: Secondary | ICD-10-CM

## 2023-01-03 HISTORY — PX: AMPUTATION TOE: SHX6595

## 2023-01-03 LAB — CBC
HCT: 23.9 % — ABNORMAL LOW (ref 36.0–46.0)
Hemoglobin: 7.8 g/dL — ABNORMAL LOW (ref 12.0–15.0)
MCH: 31.2 pg (ref 26.0–34.0)
MCHC: 32.6 g/dL (ref 30.0–36.0)
MCV: 95.6 fL (ref 80.0–100.0)
Platelets: 110 10*3/uL — ABNORMAL LOW (ref 150–400)
RBC: 2.5 MIL/uL — ABNORMAL LOW (ref 3.87–5.11)
RDW: 17.1 % — ABNORMAL HIGH (ref 11.5–15.5)
WBC: 7.2 10*3/uL (ref 4.0–10.5)
nRBC: 0 % (ref 0.0–0.2)

## 2023-01-03 LAB — COMPREHENSIVE METABOLIC PANEL
ALT: 14 U/L (ref 0–44)
AST: 26 U/L (ref 15–41)
Albumin: 2 g/dL — ABNORMAL LOW (ref 3.5–5.0)
Alkaline Phosphatase: 99 U/L (ref 38–126)
Anion gap: 9 (ref 5–15)
BUN: 83 mg/dL — ABNORMAL HIGH (ref 8–23)
CO2: 29 mmol/L (ref 22–32)
Calcium: 6.4 mg/dL — CL (ref 8.9–10.3)
Chloride: 102 mmol/L (ref 98–111)
Creatinine, Ser: 2.2 mg/dL — ABNORMAL HIGH (ref 0.44–1.00)
GFR, Estimated: 22 mL/min — ABNORMAL LOW (ref >60)
Glucose, Bld: 64 mg/dL — ABNORMAL LOW (ref 70–99)
Potassium: 3.9 mmol/L (ref 3.5–5.1)
Sodium: 140 mmol/L (ref 135–145)
Total Bilirubin: 0.4 mg/dL (ref 0.3–1.2)
Total Protein: 4.7 g/dL — ABNORMAL LOW (ref 6.5–8.1)

## 2023-01-03 LAB — GLUCOSE, CAPILLARY
Glucose-Capillary: 22 mg/dL — CL (ref 70–99)
Glucose-Capillary: 99 mg/dL (ref 70–99)
Glucose-Capillary: 125 mg/dL — ABNORMAL HIGH (ref 70–99)
Glucose-Capillary: 47 mg/dL — ABNORMAL LOW (ref 70–99)
Glucose-Capillary: 102 mg/dL — ABNORMAL HIGH (ref 70–99)
Glucose-Capillary: 98 mg/dL (ref 70–99)
Glucose-Capillary: 110 mg/dL — ABNORMAL HIGH (ref 70–99)
Glucose-Capillary: 159 mg/dL — ABNORMAL HIGH (ref 70–99)

## 2023-01-03 LAB — BRAIN NATRIURETIC PEPTIDE: B Natriuretic Peptide: 742.1 pg/mL — ABNORMAL HIGH (ref 0.0–100.0)

## 2023-01-03 LAB — TYPE AND SCREEN
ABO/RH(D): O NEG
Antibody Screen: NEGATIVE

## 2023-01-03 LAB — APTT
aPTT: >200 seconds (ref 24–36)
aPTT: 28 seconds (ref 24–36)
aPTT: >200 seconds (ref 24–36)

## 2023-01-03 LAB — HEPARIN LEVEL (UNFRACTIONATED): Heparin Unfractionated: >1.1 IU/mL — ABNORMAL HIGH (ref 0.30–0.70)

## 2023-01-03 LAB — MAGNESIUM: Magnesium: 2.3 mg/dL (ref 1.7–2.4)

## 2023-01-03 SURGERY — AMPUTATION, TOE
Anesthesia: Monitor Anesthesia Care | Site: Toe | Laterality: Left

## 2023-01-03 MED ORDER — LIDOCAINE HCL 2 % IJ SOLN
INTRAMUSCULAR | Status: DC | PRN
  Administered 2023-01-03: 9 mL

## 2023-01-03 MED ORDER — SODIUM CHLORIDE 0.9 % IV SOLN: INTRAVENOUS | Status: DC

## 2023-01-03 MED ORDER — ACETAMINOPHEN 10 MG/ML IV SOLN: 1000.0000 mg | Freq: Once | INTRAVENOUS | Status: DC | PRN

## 2023-01-03 MED ORDER — LIDOCAINE HCL 2 % IJ SOLN
INTRAMUSCULAR | Status: AC
  Filled 2023-01-03: qty 20

## 2023-01-03 MED ORDER — INSULIN ASPART 100 UNIT/ML IJ SOLN: 0.0000 [IU] | INTRAMUSCULAR | Status: DC | PRN

## 2023-01-03 MED ORDER — PROPOFOL 10 MG/ML IV BOLUS
INTRAVENOUS | Status: DC | PRN
  Administered 2023-01-03: 75 ug/kg/min via INTRAVENOUS

## 2023-01-03 MED ORDER — DEXTROSE 50 % IV SOLN
INTRAVENOUS | Status: AC
  Filled 2023-01-03: qty 50

## 2023-01-03 MED ORDER — DEXTROSE 50 % IV SOLN
INTRAVENOUS | Status: AC
  Administered 2023-01-03: 25 g via INTRAVENOUS
  Filled 2023-01-03: qty 50

## 2023-01-03 MED ORDER — LACTATED RINGERS IV SOLN: INTRAVENOUS | Status: DC | PRN

## 2023-01-03 MED ORDER — FENTANYL CITRATE (PF) 250 MCG/5ML IJ SOLN
INTRAMUSCULAR | Status: DC | PRN
  Administered 2023-01-03: 25 ug via INTRAVENOUS

## 2023-01-03 MED ORDER — BUPIVACAINE HCL (PF) 0.5 % IJ SOLN
INTRAMUSCULAR | Status: DC | PRN
  Administered 2023-01-03: 9 mL

## 2023-01-03 MED ORDER — INSULIN GLARGINE-YFGN 100 UNIT/ML ~~LOC~~ SOLN: 5.0000 [IU] | Freq: Every day | SUBCUTANEOUS | Status: DC

## 2023-01-03 MED ORDER — DEXTROSE 50 % IV SOLN
25.0000 g | INTRAVENOUS | Status: AC
  Filled 2023-01-03: qty 50

## 2023-01-03 MED ORDER — HYDROMORPHONE HCL 1 MG/ML IJ SOLN
1.0000 mg | Freq: Four times a day (QID) | INTRAMUSCULAR | Status: DC | PRN
  Administered 2023-01-03 – 2023-01-04 (×2): 1 mg via INTRAVENOUS
  Filled 2023-01-03 (×2): qty 1

## 2023-01-03 MED ORDER — ORAL CARE MOUTH RINSE
15.0000 mL | Freq: Once | OROMUCOSAL | Status: AC
  Administered 2023-01-03: 15 mL via OROMUCOSAL

## 2023-01-03 MED ORDER — BUPIVACAINE HCL (PF) 0.5 % IJ SOLN
INTRAMUSCULAR | Status: AC
  Filled 2023-01-03: qty 20

## 2023-01-03 MED ORDER — 0.9 % SODIUM CHLORIDE (POUR BTL) OPTIME
TOPICAL | Status: DC | PRN
  Administered 2023-01-03: 1000 mL

## 2023-01-03 MED ORDER — CHLORHEXIDINE GLUCONATE 0.12 % MT SOLN
OROMUCOSAL | Status: AC
  Filled 2023-01-03: qty 15

## 2023-01-03 MED ORDER — CHLORHEXIDINE GLUCONATE 0.12 % MT SOLN: 15.0000 mL | Freq: Once | OROMUCOSAL | Status: DC

## 2023-01-03 MED ORDER — CEFAZOLIN SODIUM-DEXTROSE 2-3 GM-%(50ML) IV SOLR
INTRAVENOUS | Status: DC | PRN
  Administered 2023-01-03: 2 g via INTRAVENOUS

## 2023-01-03 MED ORDER — HEPARIN (PORCINE) 25000 UT/250ML-% IV SOLN
1000.0000 [IU]/h | INTRAVENOUS | Status: DC
  Administered 2023-01-03: 750 [IU]/h via INTRAVENOUS
  Administered 2023-01-04: 1000 [IU]/h via INTRAVENOUS
  Filled 2023-01-03 (×2): qty 250

## 2023-01-03 MED ORDER — PROPOFOL 10 MG/ML IV BOLUS
INTRAVENOUS | Status: AC
  Filled 2023-01-03: qty 20

## 2023-01-03 MED ORDER — DEXTROSE 50 % IV SOLN
25.0000 g | INTRAVENOUS | Status: AC
  Administered 2023-01-03: 25 g via INTRAVENOUS

## 2023-01-03 MED ORDER — FENTANYL CITRATE (PF) 250 MCG/5ML IJ SOLN
INTRAMUSCULAR | Status: AC
  Filled 2023-01-03: qty 5

## 2023-01-03 MED ORDER — DEXTROSE 50 % IV SOLN: 1.0000 | Freq: Once | INTRAVENOUS | Status: DC

## 2023-01-03 MED ORDER — FENTANYL CITRATE (PF) 100 MCG/2ML IJ SOLN: 25.0000 ug | INTRAMUSCULAR | Status: DC | PRN

## 2023-01-03 MED ORDER — CALCIUM GLUCONATE-NACL 2-0.675 GM/100ML-% IV SOLN
2.0000 g | Freq: Once | INTRAVENOUS | Status: AC
  Administered 2023-01-03: 2000 mg via INTRAVENOUS
  Filled 2023-01-03: qty 100

## 2023-01-03 MED ORDER — ONDANSETRON HCL 4 MG/2ML IJ SOLN: 4.0000 mg | Freq: Once | INTRAMUSCULAR | Status: DC | PRN

## 2023-01-03 MED ORDER — POTASSIUM CL IN DEXTROSE 5% 20 MEQ/L IV SOLN
20.0000 meq | INTRAVENOUS | Status: AC
  Administered 2023-01-03: 20 meq via INTRAVENOUS
  Administered 2023-01-03: 100 mL via INTRAVENOUS
  Filled 2023-01-03: qty 1000

## 2023-01-03 SURGICAL SUPPLY — 42 items
ALLOGRAFT SALERA POWDER 80 (Graft) IMPLANT
APL PRP STRL LF DISP 70% ISPRP (MISCELLANEOUS)
BAG COUNTER SPONGE SURGICOUNT (BAG) ×2 IMPLANT
BAG SPNG CNTER NS LX DISP (BAG) ×1
BLADE SURG 15 STRL LF DISP TIS (BLADE) IMPLANT
BLADE SURG 15 STRL SS (BLADE)
BNDG ELASTIC 4X5.8 VLCR STR LF (GAUZE/BANDAGES/DRESSINGS) IMPLANT
BNDG GAUZE DERMACEA FLUFF 4 (GAUZE/BANDAGES/DRESSINGS) ×2 IMPLANT
BNDG GZE DERMACEA 4 6PLY (GAUZE/BANDAGES/DRESSINGS) ×1
CHLORAPREP W/TINT 26 (MISCELLANEOUS) IMPLANT
COVER SURGICAL LIGHT HANDLE (MISCELLANEOUS) ×2 IMPLANT
CUFF TOURN SGL QUICK 18X4 (TOURNIQUET CUFF) ×2 IMPLANT
CUFF TOURN SGL QUICK 24 (TOURNIQUET CUFF)
CUFF TRNQT CYL 24X4X16.5-23 (TOURNIQUET CUFF) IMPLANT
ELECT REM PT RETURN 9FT ADLT (ELECTROSURGICAL)
ELECTRODE REM PT RTRN 9FT ADLT (ELECTROSURGICAL) IMPLANT
GAUZE PAD ABD 8X10 STRL (GAUZE/BANDAGES/DRESSINGS) ×2 IMPLANT
GAUZE SPONGE 4X4 12PLY STRL (GAUZE/BANDAGES/DRESSINGS) ×2 IMPLANT
GAUZE XEROFORM 1X8 LF (GAUZE/BANDAGES/DRESSINGS) ×2 IMPLANT
GLOVE BIO SURGEON STRL SZ8 (GLOVE) ×2 IMPLANT
GLOVE BIOGEL PI IND STRL 8 (GLOVE) ×2 IMPLANT
GOWN STRL REUS W/ TWL LRG LVL3 (GOWN DISPOSABLE) ×4 IMPLANT
GOWN STRL REUS W/TWL LRG LVL3 (GOWN DISPOSABLE) ×2
KIT BASIN OR (CUSTOM PROCEDURE TRAY) ×2 IMPLANT
KIT TURNOVER KIT B (KITS) ×2 IMPLANT
NDL PRECISIONGLIDE 27X1.5 (NEEDLE) ×2 IMPLANT
NEEDLE PRECISIONGLIDE 27X1.5 (NEEDLE) ×1 IMPLANT
NS IRRIG 1000ML POUR BTL (IV SOLUTION) ×2 IMPLANT
PACK ORTHO EXTREMITY (CUSTOM PROCEDURE TRAY) ×2 IMPLANT
PAD ARMBOARD 7.5X6 YLW CONV (MISCELLANEOUS) ×4 IMPLANT
PAD CAST 4YDX4 CTTN HI CHSV (CAST SUPPLIES) ×2 IMPLANT
PADDING CAST COTTON 4X4 STRL (CAST SUPPLIES) ×1
SOL PREP POV-IOD 4OZ 10% (MISCELLANEOUS) ×2 IMPLANT
STAPLER VISISTAT 35W (STAPLE) IMPLANT
SUT PROLENE 4 0 PS 2 18 (SUTURE) IMPLANT
SUT VIC AB 3-0 PS2 18 (SUTURE) IMPLANT
SUT VICRYL 4-0 PS2 18IN ABS (SUTURE) IMPLANT
SYR CONTROL 10ML LL (SYRINGE) ×2 IMPLANT
TOWEL GREEN STERILE (TOWEL DISPOSABLE) ×2 IMPLANT
TOWEL GREEN STERILE FF (TOWEL DISPOSABLE) ×2 IMPLANT
TUBE CONNECTING 12X1/4 (SUCTIONS) ×2 IMPLANT
YANKAUER SUCT BULB TIP NO VENT (SUCTIONS) IMPLANT

## 2023-01-03 NOTE — Progress Notes (Signed)
PROGRESS NOTE        PATIENT DETAILS Name: [name] Age: 82 y.o. Sex: female Date of Birth: 1941-12-02 Admit Date: 12/31/2022 Admitting Physician Catherine Mutton Kristeen Mans, MD DQQ:[id number] Catherine Beals, MD  Brief Summary: Patient is a 82 y.o.  female with medical history significant of PAD with critical limb ischemia (dry gangrene of left great toe), chronic HFpEF, PAF on Eliquis, CAD (managed medically-Plavix/statin) CKD stage IV, DM-2, Keppra, history of AML (in remission)-sent from PCPs office for evaluation of worsening lower extremity edema/hypoxemia-found to have acute on chronic HFpEF and left leg critical limb ischemia with dry gangrene of left great toe.   Significant events: 12/30-1/18>> hospitalization for left lower limb critical ischemia-s/p angiogram with balloon angioplasty-treated for UTI/hypokalemia/AKI/HFpEF exacerbation.  Discharged home with home health.   1/25>> sent to ED by PCP-for SOB/worsening leg swelling-HFpEF exacerbation, dry gangrene of left great toe.  Significant studies: 1/1>>1, 25 vitamin D:17.2 (low) 1/1>>25 vitamin D:19.58 (low) 1/3>>PTH 307 (high) 1/25>> CXR: Pulmonary vascular congestion, small bilateral pleural effusions 1/25>> lower extremity arterial duplex: 50-74% stenosis left superficial femoral artery, left popliteal artery and left posterior tibial artery  1/26.  ABIs. Duplex and ABIs reviewed appears to have restenosis of the left SFA and will need likely stenting on Monday.    Significant microbiology data: 1/24>> blood culture: No growth 1/25>> COVID/influenza/RSV PCR: Negative  Procedures: None  Consults: Vascular surgery Podiatry  Subjective: Patient in bed, appears comfortable, denies any headache, no fever, no chest pain or pressure, no shortness of breath , no abdominal pain. No focal weakness.  Objective: Vitals: Blood pressure 102/62, pulse 98, temperature (!) 97.5 F (36.4 C), temperature  source Oral, resp. rate 16, height '5\' 2"'$  (1.575 m), weight 71 kg, SpO2 94 %.   Exam:  Awake Alert, No new F.N deficits, Normal affect [place].AT,PERRAL Supple Neck, No JVD,   Symmetrical Chest wall movement, Good air movement bilaterally, CTAB RRR,No Gallops, Rubs or new Murmurs,  +ve B.Sounds, Abd Soft, No tenderness,   Left big toe gangrenous and necrotic, 1+ leg edema   Assessment/Plan:  Acute hypoxic respiratory failure 2 acute on chronic diastolic CHF EF 19%. Due to HFpEF exacerbation Remains on IV Lasix, monitor closely. On minimal amount of oxygen-titrate to room air over the next few days. Discussed with nursing staff regarding strict intake/output.   Follow daily weights/electrolytes   Critical limb ischemia-left toe gangrene - seen by both vascular surgery along with podiatry, S/p arteriogram on 1/17 with balloon angioplasty of ostial SFA lesion, proximal SFA lesion, Arterial Doppler on 1/25 Duplex and ABIs reviewed appears to have restenosis of the left SFA, per vascular surgery likely stenting on 01/05/2023, due for left great toe amputation by podiatry on 01/03/2023.  Agreeable for blood transfusion if needed.    Hypocalcemia  Recent PTH levels appropriately elevated, vitamin D levels low Just started on calcitriol/Os-Cal yesterday-will increase calcitriol to 0.5 mcg today, change Os-Cal to 3 times daily dosing IV calcium given on 01/03/2023. Discussed with nephrologist-Dr. Gean Quint on 1/25-if levels do not appropriately increase over the next several days-we will formally consult nephrology. Repeat electrolytes tomorrow.  Hypomagnesemia Replaced   CKD stage IV, baseline creatinine around 2.2 At baseline Follow- electrolytes closely   Normocytic anemia Due to CKD No evidence of blood loss Follow CBC closely   PAF Continue metoprolol Eliquis on hold-on  IV heparin-in anticipation of left great toe amputation over the weekend.    CAD Treated medically-continue  Plavix/statin/beta-blocker No current anginal symptoms   HLD Statin   Seizure disorder Keppra   History of AML-in remission Follows with Dr. [name] as an outpatient  DM-2 (A1c 9.9 on 08/13/2022) Poor glycemic morning of 01/03/2023, glycemic regimen changed while she is NPO.  Repeat surgery on 01/05/2023.  Will place on long-acting insulin once she is done from surgical procedures with cautious CBG monitoring.  Recent Labs    01/03/23 0655 01/03/23 0841 01/03/23 0912  GLUCAP 99 71* 125*       Palliative care Very frail DNR in place Multiple medical issues as above-second hospitalization within a week from discharge At risk for further decompensation Have placed palliative care consultation for goals of care-suspect may benefit from establishment of home hospice on discharge.   BMI: Estimated body mass index is 28.63 kg/m as calculated from the following:   Height as of this encounter: '5\' 2"'$  (1.575 m).   Weight as of this encounter: 71 kg.   Code status:   Code Status: DNR   DVT Prophylaxis: IV heparin Place TED hose Start: 01/03/23 0919 Family Communication: Daughter-[phone]-left VM 1/26   Disposition Plan: Status is: Inpatient Remains inpatient appropriate because: Severity of illness.   Planned Discharge Destination:Home   Diet: Diet Order             Diet NPO time specified  Diet effective midnight                     Antimicrobial agents: Anti-infectives (From admission, onward)    None        MEDICATIONS: Scheduled Meds:  [MAR Hold] ascorbic acid  1,000 mg Oral Daily   [MAR Hold] atorvastatin  40 mg Oral Daily   [MAR Hold] calcitRIOL  0.5 mcg Oral Daily   [MAR Hold] calcium acetate  1,334 mg Oral TID WC   [MAR Hold] calcium carbonate  1,250 mg Oral TID WC   [MAR Hold] clopidogrel  75 mg Oral QPM   dextrose       [MAR Hold] fluticasone furoate-vilanterol  1 puff Inhalation Daily   [MAR Hold] furosemide  80 mg Intravenous  Q12H   [MAR Hold] insulin aspart  0-9 Units Subcutaneous TID WC   [MAR Hold] isosorbide mononitrate  30 mg Oral Daily   [MAR Hold] levETIRAcetam  500 mg Oral BID   [MAR Hold] melatonin  3 mg Oral QHS   [MAR Hold] metoprolol succinate  12.5 mg Oral Daily   [MAR Hold] polyethylene glycol  17 g Oral Daily   [MAR Hold] potassium chloride SA  20 mEq Oral Daily   [MAR Hold] sodium chloride flush  3 mL Intravenous Q12H   Continuous Infusions:  [MAR Hold] sodium chloride     sodium chloride     acetaminophen     [MAR Hold] calcium gluconate 2,000 mg (01/03/23 0858)   dextrose 5 % with KCl 20 mEq / L 20 mEq (01/03/23 0749)   PRN Meds:.[MAR Hold] sodium chloride, acetaminophen, [MAR Hold] acetaminophen **OR** [MAR Hold] acetaminophen, [MAR Hold] albuterol, dextrose, fentaNYL (SUBLIMAZE) injection, [MAR Hold] hydrALAZINE, insulin aspart, [MAR Hold] LORazepam, [MAR Hold] nitroGLYCERIN, [MAR Hold] ondansetron **OR** [MAR Hold] ondansetron (ZOFRAN) IV, ondansetron (ZOFRAN) IV, [MAR Hold] oxyCODONE-acetaminophen, [MAR Hold] sodium chloride flush   I have personally reviewed following labs and imaging studies  LABORATORY DATA:  Recent Labs  Lab 12/31/22 1805 01/02/23 0712 01/03/23  0205  WBC 5.8 7.5 7.2  HGB 9.6* 8.2* 7.8*  HCT 30.8* 25.7* 23.9*  PLT 156 122* 110*  MCV 99.7 97.0 95.6  MCH 31.1 30.9 31.2  MCHC 31.2 31.9 32.6  RDW 17.2* 17.2* 17.1*  LYMPHSABS 0.8  --   --   MONOABS 0.4  --   --   EOSABS 0.0  --   --   BASOSABS 0.0  --   --     Recent Labs  Lab 12/31/22 1805 01/01/23 0205 01/01/23 0411 01/02/23 0556 01/03/23 0205  NA 140  --   --  141 140  K 4.0  --   --  3.9 3.9  CL 102  --   --  104 102  CO2 27  --   --  28 29  ANIONGAP 11  --   --  9 9  GLUCOSE 244*  --   --  335* 64*  BUN 85*  --   --  80* 83*  CREATININE 2.38*  --   --  2.09* 2.20*  AST  --   --   --   --  26  ALT  --   --   --   --  14  ALKPHOS  --   --   --   --  99  BILITOT  --   --   --   --  0.4   ALBUMIN  --   --   --  2.2* 2.0*  LATICACIDVEN 1.2 0.7  --   --   --   BNP  --   --  881.2*  --  742.1*  MG  --   --   --  1.5* 2.3  CALCIUM 6.2*  --   --  5.8* 6.4*    RADIOLOGY STUDIES/RESULTS: VAS [place] LOWER EXTREMITY ARTERIAL DUPLEX  Result Date: 01/02/2023 LOWER EXTREMITY ARTERIAL DUPLEX STUDY Patient Name:  [name]  Date of Exam:   01/01/2023 Medical Rec #: [id number]      Accession #:    [id number] Date of Birth: 05-Nov-1941      Patient Gender: F Patient Age:   46 years Exam [facility]  [facility] Procedure:      VAS [place] LOWER EXTREMITY ARTERIAL DUPLEX Referring Phys: EMMA COLLINS --------------------------------------------------------------------------------  Indications: Gangrene. High Risk Factors: Hypertension, hyperlipidemia, Diabetes, past history of                    smoking, prior MI, coronary artery disease. Other Factors: CHF, CKD, PAD.  Vascular Interventions: 12/24/2022 LLE balloon angioplasty of SFA. Current ABI:            Non-compressible bilaterally Performing Technologist: Rogelia Rohrer RVT, RDMS Supporting Technologist: Darlin Coco RDMS, RVT  Examination Guidelines: A complete evaluation includes B-mode imaging, spectral Doppler, color Doppler, and power Doppler as needed of all accessible portions of each vessel. Bilateral testing is considered an integral part of a complete examination. Limited examinations for reoccurring indications may be performed as noted.   +-----------+--------+-----+--------------+------------------+-----------------+ LEFT       PSV cm/sRatioStenosis      Waveform          Comments          +-----------+--------+-----+--------------+------------------+-----------------+ CFA Mid    148                        biphasic          Turbulent         +-----------+--------+-----+--------------+------------------+-----------------+  DFA        71                         monophasic                           +-----------+--------+-----+--------------+------------------+-----------------+ SFA Prox   170     2:1  50-74%        monophasic                                                  stenosis                                          +-----------+--------+-----+--------------+------------------+-----------------+ SFA Mid    80>157  2:1  50-74%        monophasic        Focal elevation                           stenosis                        of 157            +-----------+--------+-----+--------------+------------------+-----------------+ SFA Distal 54                                                             +-----------+--------+-----+--------------+------------------+-----------------+ POP Prox   111                        monophasic                          +-----------+--------+-----+--------------+------------------+-----------------+ POP Mid    274     2:1  50-74%        monophasic                                                  stenosis                                          +-----------+--------+-----+--------------+------------------+-----------------+ POP Distal 79                         monophasic                          +-----------+--------+-----+--------------+------------------+-----------------+ TP Trunk   65                         monophasic                          +-----------+--------+-----+--------------+------------------+-----------------+  ATA Prox   44                         monophasic                          +-----------+--------+-----+--------------+------------------+-----------------+ ATA Mid    11                         dampened                                                                  monophasic                          +-----------+--------+-----+--------------+------------------+-----------------+ ATA Distal 28                         monophasic                           +-----------+--------+-----+--------------+------------------+-----------------+ PTA Prox   39                         monophasic                          +-----------+--------+-----+--------------+------------------+-----------------+ PTA Mid    90      2:1  50-74%        monophasic                                                  stenosis                                          +-----------+--------+-----+--------------+------------------+-----------------+ PTA Distal 70                         monophasic                          +-----------+--------+-----+--------------+------------------+-----------------+ PERO Prox  32                         monophasic                          +-----------+--------+-----+--------------+------------------+-----------------+ PERO Mid   32                         monophasic                          +-----------+--------+-----+--------------+------------------+-----------------+ PERO Distal28                         dampened  monophasic                          +-----------+--------+-----+--------------+------------------+-----------------+  Summary: Left: Multi-focal 50-74% stenosis noted in the superficial femoral artery. 50-74% stenosis noted in the popliteal artery. 50-74% stenosis noted in the posterior tibial artery. Bilateral: Abnormal common femoral waveforms bilaterally suggestive of inflow disease.  See table(s) above for measurements and observations. Electronically signed by Servando Snare MD on 01/02/2023 at 1:49:40 PM.    Final    VAS [place] ABI WITH/WO TBI  Result Date: 01/02/2023  LOWER EXTREMITY DOPPLER STUDY Patient Name:  [name]  Date of Exam:   01/01/2023 Medical Rec #: [id number]      Accession #:    [id number] Date of Birth: 18-Feb-1941      Patient Gender: F Patient Age:   55 years Exam Location:  [facility] Procedure:      VAS  [place] ABI WITH/WO TBI Referring Phys: EMMA COLLINS --------------------------------------------------------------------------------  Indications: Gangrene. High Risk Factors: Hypertension, hyperlipidemia, Diabetes, past history of                    smoking, prior MI, coronary artery disease. Other Factors: CHF, CKD, PAD.  Vascular Interventions: 12/24/2022 LLE balloon angioplastry of SFA. Comparison Study: Previous exam 12/11/2022 Performing Technologist: Rogelia Rohrer RVT, RDMS Supporting Technologist: Darlin Coco RDMS, RVT  Examination Guidelines: A complete evaluation includes at minimum, Doppler waveform signals and systolic blood pressure reading at the level of bilateral brachial, anterior tibial, and posterior tibial arteries, when vessel segments are accessible. Bilateral testing is considered an integral part of a complete examination. Photoelectric Plethysmograph (PPG) waveforms and toe systolic pressure readings are included as required and additional duplex testing as needed. Limited examinations for reoccurring indications may be performed as noted.  ABI Findings: +---------+------------------+-----+----------+--------+ Right    Rt Pressure (mmHg)IndexWaveform  Comment  +---------+------------------+-----+----------+--------+ Brachial 123                    triphasic          +---------+------------------+-----+----------+--------+ PTA                             monophasic         +---------+------------------+-----+----------+--------+ DP                              monophasic         +---------+------------------+-----+----------+--------+ Great Toe64                0.50 Abnormal           +---------+------------------+-----+----------+--------+ +---------+------------------+-----+----------+-------+ Left     Lt Pressure (mmHg)IndexWaveform  Comment +---------+------------------+-----+----------+-------+ Brachial 129                    triphasic          +---------+------------------+-----+----------+-------+ PTA                             monophasic        +---------+------------------+-----+----------+-------+ DP                              monophasic        +---------+------------------+-----+----------+-------+ Rella Larve  0.00 Absent            +---------+------------------+-----+----------+-------+ +-------+-----------+-----------+------------+------------+ ABI/TBIToday's ABIToday's TBIPrevious ABIPrevious TBI +-------+-----------+-----------+------------+------------+ Right  [place]         0.50       [place]          0.26         +-------+-----------+-----------+------------+------------+ Left   [place]         Absent     0.95                     +-------+-----------+-----------+------------+------------+  Summary: Right: Resting right ankle-brachial index indicates noncompressible right lower extremity arteries. The right toe-brachial index is abnormal. Left: Resting left ankle-brachial index indicates noncompressible left lower extremity arteries. The left toe-brachial index is absent. *See table(s) above for measurements and observations.  Electronically signed by Servando Snare MD on 01/02/2023 at 1:49:25 PM.    Final      LOS: 2 days   Signature  -    Lala Lund M.D on 01/03/2023 at 9:21 AM   -  To page go to [email or web address]

## 2023-01-03 NOTE — Progress Notes (Addendum)
Critical results of aptt <200 and heparin <1.10. MD and Pharmacy notified.

## 2023-01-03 NOTE — Brief Op Note (Signed)
01/03/2023  12:26 PM  PATIENT:  [name]  82 y.o. female  PRE-OPERATIVE DIAGNOSIS:  Left Great Toe Gangreen  POST-OPERATIVE DIAGNOSIS:  Left Great Toe Gangreen  PROCEDURE:  Procedure(s): AMPUTATION LEFT GREAT TOE with Partial First Ray Amputation (Left)  SURGEON:  Surgeon(s) and Role:    Edrick Kins, DPM - Primary  PHYSICIAN ASSISTANT:   ASSISTANTS: none   ANESTHESIA:   MAC + local anesthesia  EBL:  minimal   BLOOD ADMINISTERED:none  DRAINS: none   LOCAL MEDICATIONS USED:  MARCAINE   , LIDOCAINE , and Amount: 15 ml  SPECIMEN:  No Specimen  DISPOSITION OF SPECIMEN:  N/A  COUNTS:  YES  TOURNIQUET:  * No tourniquets in log *  DICTATION: .Dragon Dictation  PLAN OF CARE: Admit to inpatient   PATIENT DISPOSITION:  PACU - hemodynamically stable.   Delay start of Pharmacological VTE agent (>24hrs) due to surgical blood loss or risk of bleeding: not applicable

## 2023-01-03 NOTE — Anesthesia Preprocedure Evaluation (Addendum)
Anesthesia Evaluation  Patient identified by MRN, date of birth, ID band Patient awake    Reviewed: Allergy & Precautions, NPO status , Patient's Chart, lab work & pertinent test results  Airway Mallampati: III  TM Distance: >3 FB Neck ROM: Full    Dental  (+) Edentulous Upper, Edentulous Lower   Pulmonary COPD, former smoker   Pulmonary exam normal        Cardiovascular hypertension, Pt. on home beta blockers + CAD, + Past MI, + Cardiac Stents and +CHF  Normal cardiovascular exam+ dysrhythmias Atrial Fibrillation   ECHO: 1. Left ventricular ejection fraction, by estimation, is 60 to 65%. The  left ventricle has normal function. Left ventricular diastolic function  could not be evaluated.   2. Right ventricular systolic function is normal. The right ventricular  size is normal.   3. Left atrial size was mild to moderately dilated. No left atrial/left  atrial appendage thrombus was detected.   4. The mitral valve is normal in structure. Trivial mitral valve  regurgitation.   5. Tricuspid valve regurgitation is moderate.   6. The aortic valve is calcified. There is moderate calcification of the  aortic valve. Aortic valve regurgitation is not visualized.   7. There is Moderate (Grade III) plaque.     Neuro/Psych Seizures -,  TIA negative psych ROS   GI/Hepatic negative GI ROS, Neg liver ROS,,,  Endo/Other  diabetes, Insulin Dependent  Hypocalcemia   Renal/GU CRFRenal disease (Cr. 2.2)     Musculoskeletal  (+) Arthritis ,    Abdominal   Peds  Hematology  (+) Blood dyscrasia, anemia   Anesthesia Other Findings Left Great Toe Gangrene  Reproductive/Obstetrics                             Anesthesia Physical Anesthesia Plan  ASA: 4  Anesthesia Plan: MAC   Post-op Pain Management:    Induction: Intravenous  PONV Risk Score and Plan: 2 and Ondansetron, Dexamethasone, Propofol infusion  and Treatment may vary due to age or medical condition  Airway Management Planned: Simple Face Mask  Additional Equipment:   Intra-op Plan:   Post-operative Plan:   Informed Consent: I have reviewed the patients History and Physical, chart, labs and discussed the procedure including the risks, benefits and alternatives for the proposed anesthesia with the patient or authorized representative who has indicated his/her understanding and acceptance.   Patient has DNR.  Discussed DNR with patient and Suspend DNR.     Plan Discussed with: CRNA  Anesthesia Plan Comments:         Anesthesia Quick Evaluation

## 2023-01-03 NOTE — Op Note (Signed)
OPERATIVE REPORT Patient name: [name] MRN: [id number] DOB: 09-13-1941  DOS: 01/03/23  Preop Dx: gangrene left great toe Postop Dx: same  Procedure:  1. Partial first ray amputation left  Surgeon: Edrick Kins DPM  Anesthesia: 50-50 mixture of 2% lidocaine plain with 0.5% Marcaine plain totaling 15 infiltrated in the patient's left foot mayo block fashion   Hemostasis: None  EBL: 20 mL Materials: MTF Salera Mini Membrane '80mg'$  Injectables: none Pathology: None  Condition: The patient tolerated the procedure and anesthesia well. No complications noted or reported   Justification for procedure: The patient is a 82 y.o. female who presents today for surgical correction of ischemic gangrene to the left great toe. All conservative modalities of been unsuccessful in providing any sort of satisfactory alleviation of symptoms with the patient. The patient was told benefits as well as possible side effects of the surgery. The patient consented for surgical correction. The patient consent form was reviewed. All patient questions were answered. No guarantees were expressed or implied. The patient and the surgeon both signed the patient consent form with the witness present and placed in the patient's chart.   Procedure in Detail: The patient was brought to the operating room, placed in the operating table in the supine position at which time an aseptic scrub and drape were performed about the patient's respective lower extremity after anesthesia was induced as described above. Attention was then directed to the surgical area where procedure number one commenced.  Procedure #1: Partial first ray amputation left great toe Attention was directed to the left great toe where a fishmouth type incision was planned and made around the first MTP of the foot.  The incision was proximal to any ischemic tissue.  Incision was carried down to the level of joint where the toe was disarticulated and  removed in toto.  After complete removal of the ischemic gangrenous toe, there was not enough soft tissue surrounding the head of the first metatarsal to allow for primary closure.  Decision was made to resect the head of the first metatarsal to allow for better primary closure and more optimal incision healing.  Additional soft tissue dissection was performed using a #15 scalpel to reflect away the soft tissue around the head of the first metatarsal at which time an osteotomy was created at the neck of the metatarsal.  The head was then removed in toto.  Irrigation utilized.  Healthy clean margins noted.  MTF Salera mini membrane amniotic allograft 80 mg was then placed within the amputation site to optimize her potential for healing and primary closure was then achieved using 4-0 Prolene suture.  Dry sterile compressive dressings were then applied to all previously mentioned incision sites about the patient's lower extremity. The patient was then transferred from the operating room to the recovery room having tolerated the procedure and anesthesia well. All vital signs are stable. After a brief stay in the recovery room the patient was readmitted to inpatient room with postoperative orders placed.    IMPRESSION: Clean margins.  Decent perfusion of the amputation site.  Good potential for healing  Edrick Kins, DPM [facility]  Dr. Edrick Kins, DPM    [address], [place] [address]  Office [phone]  Fax [phone]

## 2023-01-03 NOTE — Progress Notes (Signed)
OT Cancellation Note  Patient Details Name: MONTANNA MCBAIN MRN: [id number] DOB: 1941/08/22   Cancelled Treatment:    Reason Eval/Treat Not Completed: Patient at procedure or test/ unavailable (Will follow up for OT evaluation as schedule permits)  Renaye Rakers, OTD, OTR/L [facility] [phone] [place] 01/03/2023, 8:31 AM

## 2023-01-03 NOTE — Progress Notes (Signed)
Patient blood sugar is 22. Ordered hypoglycemia protocol. MD put some order as well.

## 2023-01-03 NOTE — Interval H&P Note (Signed)
History and Physical Interval Note:  01/03/2023 9:47 AM  [name]  has presented today for surgery, with the diagnosis of Left Great Toe Gangreen.  The various methods of treatment have been discussed with the patient and family. After consideration of risks, benefits and other options for treatment, the patient has consented to  Procedure(s): AMPUTATION LEFT GREAT TOE (Left) as a surgical intervention.  The patient's history has been reviewed, patient examined, no change in status, stable for surgery.  I have reviewed the patient's chart and labs.  Questions were answered to the patient's satisfaction.     Edrick Kins

## 2023-01-03 NOTE — Progress Notes (Signed)
Patient's daughter called and asked update regarding her mother's surgery. She claimed that her mother's surgery was done because apparently according to her a surgeon called her around 6AM today and told her that the surgery went well. RN go through the notes and told her that the surgery will be done tomorrow. RN told the daughter to call her back asap. RN secured chat Dr Amalia Hailey and told him about the patient's daughter concerns and questions. Dr Amalia Hailey tried to reach her out but the call went directly to voicemail. RN tried to cll as well and directed to voicemail as well.

## 2023-01-03 NOTE — Progress Notes (Signed)
PT Cancellation Note  Patient Details Name: [name] MRN: [id number] DOB: 07/01/1941   Cancelled Treatment:    Reason Eval/Treat Not Completed: Patient at procedure or test/unavailable  Wyona Almas, PT, DPT Acute Rehabilitation Services Office [place] 01/03/2023, 8:31 AM

## 2023-01-03 NOTE — Progress Notes (Signed)
Patient calcium is 6.4. MD notified.

## 2023-01-03 NOTE — Progress Notes (Signed)
ANTICOAGULATION CONSULT NOTE  Pharmacy Consult for heparin  Indication: atrial fibrillation  Allergies  Allergen Reactions   Chlorhexidine Gluconate Itching and Rash    Patient Measurements: Height: '5\' 2"'$  (157.5 cm) Weight: 71 kg (156 lb 8.4 oz) IBW/kg (Calculated) : 50.1 HEPARIN DW (KG): 65.1   Vital Signs: Temp: 97.1 F (36.2 C) (01/27 1130) Temp Source: Axillary (01/27 0946) BP: 134/56 (01/27 1130) Pulse Rate: 73 (01/27 1130)  Labs: Recent Labs    12/31/22 1805 01/02/23 0556 01/02/23 0712 01/02/23 1823 01/03/23 0205  HGB 9.6*  --  8.2*  --  7.8*  HCT 30.8*  --  25.7*  --  23.9*  PLT 156  --  122*  --  110*  APTT  --  119*  --  60* >200*  HEPARINUNFRC  --  1.04*  --   --  >1.10*  CREATININE 2.38* 2.09*  --   --  2.20*     Estimated Creatinine Clearance: 18.5 mL/min (A) (by C-G formula based on SCr of 2.2 mg/dL (H)).  Assessment: Patient admitted to ED for shortness of breath and HFpEF exacerbation. Patient on Eliquis 2.5 mg PO BID PTA for afib. History of CKD IV. Presenting with ischemic limb. Eliquis on hold for toe amputation. Pharmacy consulted to dose heparin.   Pharmacy to restart heparin at 1400 following procedure on 1/27--heparin has been paused since 1/27 '@0200'$  in anticipation for procedure. Hgb 7.8, plts 110. Most recent aPTT supratherapeutic on 800 units/hr, however aPTT was subtherapeutic at previous rate of 750 units/hr. Will conservatively start infusion at 750 units/hr given recent supratherapeutic aPTT and down-trending hgb/plts.   Goal of Therapy:  Heparin level 0.3-0.7 units/ml aPTT 66-102 seconds Monitor platelets by anticoagulation protocol: Yes   Plan:  Restart heparin gtt @ 750 units/hr on 1/27 '@1400'$  Check aPTT in 8 hours Monitor aPTT, heparin level, CBC, s/sx of bleeding daily    Billey Gosling, PharmD PGY1 Pharmacy Resident 1/27/20241:41 PM

## 2023-01-03 NOTE — Anesthesia Procedure Notes (Signed)
Procedure Name: MAC Date/Time: 01/03/2023 10:14 AM  Performed by: Anastasio Auerbach, CRNAPre-anesthesia Checklist: Emergency Drugs available, Suction available and Patient being monitored Oxygen Delivery Method: Simple face mask Induction Type: IV induction

## 2023-01-03 NOTE — Transfer of Care (Signed)
Immediate Anesthesia Transfer of Care Note  Patient: [name]  Procedure(s) Performed: AMPUTATION LEFT GREAT TOE with Partial First Ray Amputation (Left: Toe)  Patient Location: PACU  Anesthesia Type:General  Level of Consciousness: awake, drowsy, and patient cooperative  Airway & Oxygen Therapy: Patient Spontanous Breathing and Patient connected to nasal cannula oxygen  Post-op Assessment: Report given to RN and Post -op Vital signs reviewed and stable  Post vital signs: Reviewed and stable  Last Vitals:  Vitals Value Taken Time  BP 100/46 01/03/23 1104  Temp    Pulse 71 01/03/23 1107  Resp 19 01/03/23 1107  SpO2 99 % 01/03/23 1107  Vitals shown include unvalidated device data.  Last Pain:  Vitals:   01/03/23 0946  TempSrc: Axillary  PainSc:          Complications: No notable events documented.

## 2023-01-03 NOTE — Anesthesia Postprocedure Evaluation (Signed)
Anesthesia Post Note  Patient: [name]  Procedure(s) Performed: AMPUTATION LEFT GREAT TOE with Partial First Ray Amputation (Left: Toe)     Patient location during evaluation: PACU Anesthesia Type: MAC Level of consciousness: awake Pain management: pain level controlled Vital Signs Assessment: post-procedure vital signs reviewed and stable Respiratory status: spontaneous breathing, nonlabored ventilation and respiratory function stable Cardiovascular status: blood pressure returned to baseline and stable Postop Assessment: no apparent nausea or vomiting Anesthetic complications: no   No notable events documented.  Last Vitals:  Vitals:   01/03/23 1115 01/03/23 1130  BP: 114/60 (!) 134/56  Pulse: 75 73  Resp: 15 18  Temp:  (!) 36.2 C  SpO2: 98% 98%    Last Pain:  Vitals:   01/03/23 1518  TempSrc:   PainSc: Asleep                 Sarabelle Genson P Creed Kail

## 2023-01-04 ENCOUNTER — Encounter (HOSPITAL_COMMUNITY): Payer: Self-pay | Admitting: Podiatry

## 2023-01-04 ENCOUNTER — Inpatient Hospital Stay (HOSPITAL_COMMUNITY): Payer: Medicare HMO

## 2023-01-04 DIAGNOSIS — I5033 Acute on chronic diastolic (congestive) heart failure: Secondary | ICD-10-CM | POA: Diagnosis not present

## 2023-01-04 LAB — GLUCOSE, CAPILLARY
Glucose-Capillary: 29 mg/dL — CL (ref 70–99)
Glucose-Capillary: 151 mg/dL — ABNORMAL HIGH (ref 70–99)
Glucose-Capillary: 54 mg/dL — ABNORMAL LOW (ref 70–99)
Glucose-Capillary: 131 mg/dL — ABNORMAL HIGH (ref 70–99)
Glucose-Capillary: 143 mg/dL — ABNORMAL HIGH (ref 70–99)
Glucose-Capillary: 261 mg/dL — ABNORMAL HIGH (ref 70–99)

## 2023-01-04 LAB — HEPARIN LEVEL (UNFRACTIONATED): Heparin Unfractionated: 0.33 IU/mL (ref 0.30–0.70)

## 2023-01-04 LAB — CBC
HCT: 28.7 % — ABNORMAL LOW (ref 36.0–46.0)
Hemoglobin: 9.4 g/dL — ABNORMAL LOW (ref 12.0–15.0)
MCH: 31.3 pg (ref 26.0–34.0)
MCHC: 32.8 g/dL (ref 30.0–36.0)
MCV: 95.7 fL (ref 80.0–100.0)
Platelets: 146 10*3/uL — ABNORMAL LOW (ref 150–400)
RBC: 3 MIL/uL — ABNORMAL LOW (ref 3.87–5.11)
RDW: 17 % — ABNORMAL HIGH (ref 11.5–15.5)
WBC: 10.8 10*3/uL — ABNORMAL HIGH (ref 4.0–10.5)
nRBC: 0 % (ref 0.0–0.2)

## 2023-01-04 LAB — COMPREHENSIVE METABOLIC PANEL
ALT: 14 U/L (ref 0–44)
AST: 28 U/L (ref 15–41)
Albumin: 2.2 g/dL — ABNORMAL LOW (ref 3.5–5.0)
Alkaline Phosphatase: 105 U/L (ref 38–126)
Anion gap: 11 (ref 5–15)
BUN: 81 mg/dL — ABNORMAL HIGH (ref 8–23)
CO2: 25 mmol/L (ref 22–32)
Calcium: 6.8 mg/dL — ABNORMAL LOW (ref 8.9–10.3)
Chloride: 103 mmol/L (ref 98–111)
Creatinine, Ser: 2.12 mg/dL — ABNORMAL HIGH (ref 0.44–1.00)
GFR, Estimated: 23 mL/min — ABNORMAL LOW (ref >60)
Glucose, Bld: 38 mg/dL — CL (ref 70–99)
Potassium: 3.9 mmol/L (ref 3.5–5.1)
Sodium: 139 mmol/L (ref 135–145)
Total Bilirubin: 0.6 mg/dL (ref 0.3–1.2)
Total Protein: 5.1 g/dL — ABNORMAL LOW (ref 6.5–8.1)

## 2023-01-04 LAB — APTT
aPTT: 52 seconds — ABNORMAL HIGH (ref 24–36)
aPTT: 58 seconds — ABNORMAL HIGH (ref 24–36)

## 2023-01-04 LAB — MAGNESIUM: Magnesium: 2 mg/dL (ref 1.7–2.4)

## 2023-01-04 LAB — BRAIN NATRIURETIC PEPTIDE: B Natriuretic Peptide: 572.6 pg/mL — ABNORMAL HIGH (ref 0.0–100.0)

## 2023-01-04 MED ORDER — SODIUM CHLORIDE 0.9 % IV SOLN
4.0000 g | Freq: Once | INTRAVENOUS | Status: AC
  Administered 2023-01-04: 4 g via INTRAVENOUS
  Filled 2023-01-04: qty 40

## 2023-01-04 MED ORDER — OXYCODONE-ACETAMINOPHEN 5-325 MG PO TABS
1.0000 | ORAL_TABLET | ORAL | Status: DC | PRN
  Administered 2023-01-04 – 2023-01-09 (×15): 2 via ORAL
  Filled 2023-01-04 (×17): qty 2

## 2023-01-04 MED ORDER — NALOXONE HCL 0.4 MG/ML IJ SOLN: 0.4000 mg | INTRAMUSCULAR | Status: DC | PRN

## 2023-01-04 MED ORDER — DEXTROSE 50 % IV SOLN
1.0000 | INTRAVENOUS | Status: DC | PRN
  Administered 2023-01-04 – 2023-01-09 (×2): 50 mL via INTRAVENOUS
  Filled 2023-01-04 (×2): qty 50

## 2023-01-04 MED ORDER — HYDROMORPHONE HCL 1 MG/ML IJ SOLN
0.5000 mg | Freq: Four times a day (QID) | INTRAMUSCULAR | Status: DC | PRN
  Administered 2023-01-04: 0.5 mg via INTRAVENOUS
  Filled 2023-01-04 (×2): qty 0.5

## 2023-01-04 MED ORDER — FUROSEMIDE 10 MG/ML IJ SOLN
80.0000 mg | Freq: Every day | INTRAMUSCULAR | Status: DC
  Administered 2023-01-05 – 2023-01-06 (×2): 80 mg via INTRAVENOUS
  Filled 2023-01-04 (×2): qty 8

## 2023-01-04 MED ORDER — OXYCODONE-ACETAMINOPHEN 5-325 MG PO TABS
1.0000 | ORAL_TABLET | Freq: Once | ORAL | Status: AC
  Administered 2023-01-04: 1 via ORAL
  Filled 2023-01-04: qty 1

## 2023-01-04 MED ORDER — METHOCARBAMOL 500 MG PO TABS
500.0000 mg | ORAL_TABLET | ORAL | Status: DC | PRN
  Administered 2023-01-05 – 2023-01-08 (×5): 500 mg via ORAL
  Filled 2023-01-04 (×5): qty 1

## 2023-01-04 MED ORDER — HYDROMORPHONE HCL 1 MG/ML IJ SOLN
1.0000 mg | Freq: Four times a day (QID) | INTRAMUSCULAR | Status: DC | PRN
  Administered 2023-01-04 – 2023-01-08 (×6): 1 mg via INTRAVENOUS
  Filled 2023-01-04 (×6): qty 1

## 2023-01-04 NOTE — Progress Notes (Signed)
ANTICOAGULATION CONSULT NOTE  Pharmacy Consult for heparin  Indication: atrial fibrillation  Allergies  Allergen Reactions   Chlorhexidine Gluconate Itching and Rash    Patient Measurements: Height: '5\' 2"'$  (157.5 cm) Weight: 71.8 kg (158 lb 4.6 oz) IBW/kg (Calculated) : 50.1 HEPARIN DW (KG): 65.1   Vital Signs: Temp: 97 F (36.1 C) (01/28 1100) Temp Source: Oral (01/28 1100) BP: 148/60 (01/28 1100) Pulse Rate: 91 (01/28 1100)  Labs: Recent Labs     0000 01/02/23 0556 01/02/23 0712 01/02/23 1823 01/03/23 0205 01/03/23 1500 01/03/23 2103 01/04/23 0549 01/04/23 1528  HGB   < >  --  8.2*  --  7.8*  --   --  9.4*  --   HCT  --   --  25.7*  --  23.9*  --   --  28.7*  --   PLT  --   --  122*  --  110*  --   --  146*  --   APTT  --  119*  --    < > >200*   < > >200* 52* 58*  HEPARINUNFRC  --  1.04*  --   --  >1.10*  --   --  0.33  --   CREATININE  --  2.09*  --   --  2.20*  --   --  2.12*  --    < > = values in this interval not displayed.     Estimated Creatinine Clearance: 19.3 mL/min (A) (by C-G formula based on SCr of 2.12 mg/dL (H)).  Assessment: 82 y.o. female admitted with CHF exacerbation, h/o Afib and Eliquis on hold, on IV heparin -aPTT= 58 on 850 units/hr  Goal of Therapy:  Heparin level 0.3-0.7 units/ml aPTT 66-102 seconds Monitor platelets by anticoagulation protocol: Yes   Plan:  Increase Heparin 1000 units/hr aPTT in 8 hrs  Hildred Laser, PharmD Clinical Pharmacist **Pharmacist phone directory can now be found on [email or web address] ([email or web address]).  Listed under [place].

## 2023-01-04 NOTE — Progress Notes (Signed)
Orthopedic Tech Progress Note Patient Details:  CASSANDRA HARBOLD [birth year] [id number]  Patient ID: [name], female   DOB: [birth year], 82 y.o.   MRN: [id number] Reached out to RN via secure chat regarding patients shoe size for post op shoe. Awaiting response. Vernona Rieger 01/04/2023, 6:41 PM

## 2023-01-04 NOTE — Progress Notes (Signed)
PT Cancellation Note  Patient Details Name: [name] MRN: [id number] DOB: 12/06/1941   Cancelled Treatment:    Reason Eval/Treat Not Completed: Patient declined, no reason specified (pt refused due to pain)  Wyona Almas, PT, DPT Acute Rehabilitation Services Office [place] 01/04/2023, 10:33 AM

## 2023-01-04 NOTE — Plan of Care (Signed)
  Problem: Coping: Goal: Ability to adjust to condition or change in health will improve Outcome: Progressing   

## 2023-01-04 NOTE — Progress Notes (Signed)
ANTICOAGULATION CONSULT NOTE  Pharmacy Consult for heparin  Indication: atrial fibrillation Brief A/P: aPTT subtherapeutic  Increase Heparin rate  Allergies  Allergen Reactions   Chlorhexidine Gluconate Itching and Rash    Patient Measurements: Height: '5\' 2"'$  (157.5 cm) Weight: 71.8 kg (158 lb 4.6 oz) IBW/kg (Calculated) : 50.1 HEPARIN DW (KG): 65.1   Vital Signs: Temp: 98.7 F (37.1 C) (01/28 0424) Temp Source: Oral (01/28 0424) BP: 133/48 (01/28 0424) Pulse Rate: 87 (01/28 0424)  Labs: Recent Labs     0000 01/02/23 0556 01/02/23 0712 01/02/23 1823 01/03/23 0205 01/03/23 1500 01/03/23 2103 01/04/23 0549  HGB   < >  --  8.2*  --  7.8*  --   --  9.4*  HCT  --   --  25.7*  --  23.9*  --   --  28.7*  PLT  --   --  122*  --  110*  --   --  146*  APTT  --  119*  --    < > >200* 28 >200* 52*  HEPARINUNFRC  --  1.04*  --   --  >1.10*  --   --  0.33  CREATININE  --  2.09*  --   --  2.20*  --   --  2.12*   < > = values in this interval not displayed.     Estimated Creatinine Clearance: 19.3 mL/min (A) (by C-G formula based on SCr of 2.12 mg/dL (H)).  Assessment: 82 y.o. female admitted with CHF exacerbation, h/o Afib and Eliquis on hold, for heparin  Goal of Therapy:  Heparin level 0.3-0.7 units/ml aPTT 66-102 seconds Monitor platelets by anticoagulation protocol: Yes   Plan:  Increase Heparin 850 units/hr Check aPTT in 8 hours   Phillis Knack, PharmD, BCPS  01/04/2023 7:00 AM

## 2023-01-04 NOTE — Progress Notes (Signed)
  Progress Note    01/04/2023 9:57 AM 1 Day Post-Op  Subjective: Complaining mostly of left hip pain and her diet restrictions  Vitals:   01/04/23 0424 01/04/23 0815  BP: (!) 133/48 (!) 152/60  Pulse: 87 81  Resp: 18 20  Temp: 98.7 F (37.1 C) (!) 97.3 F (36.3 C)  SpO2: 93% 100%    Physical Exam: Awake alert oriented There is a dressing on the left foot  CBC    Component Value Date/Time   WBC 10.8 (H) 01/04/2023 0549   RBC 3.00 (L) 01/04/2023 0549   HGB 9.4 (L) 01/04/2023 0549   HGB 9.1 (L) 11/10/2022 1412   HGB 11.2 10/06/2017 1632   HGB 9.6 (L) 09/04/2017 1344   HCT 28.7 (L) 01/04/2023 0549   HCT 35.6 10/06/2017 1632   HCT 29.3 (L) 09/04/2017 1344   PLT 146 (L) 01/04/2023 0549   PLT 181 11/10/2022 1412   PLT 213 10/06/2017 1632   MCV 95.7 01/04/2023 0549   MCV 93 10/06/2017 1632   MCV 95.6 09/04/2017 1344   MCH 31.3 01/04/2023 0549   MCHC 32.8 01/04/2023 0549   RDW 17.0 (H) 01/04/2023 0549   RDW 15.1 10/06/2017 1632   RDW 14.7 (H) 09/04/2017 1344   LYMPHSABS 0.8 12/31/2022 1805   LYMPHSABS 1.4 09/04/2017 1344   MONOABS 0.4 12/31/2022 1805   MONOABS 0.6 09/04/2017 1344   EOSABS 0.0 12/31/2022 1805   EOSABS 0.1 09/04/2017 1344   BASOSABS 0.0 12/31/2022 1805   BASOSABS 0.0 09/04/2017 1344    BMET    Component Value Date/Time   NA 139 01/04/2023 0549   NA 147 (H) 05/19/2022 1605   NA 144 09/04/2017 1436   K 3.9 01/04/2023 0549   K 4.1 09/04/2017 1436   CL 103 01/04/2023 0549   CO2 25 01/04/2023 0549   CO2 26 09/04/2017 1436   GLUCOSE 38 (LL) 01/04/2023 0549   GLUCOSE 152 (H) 09/04/2017 1436   BUN 81 (H) 01/04/2023 0549   BUN 26 05/19/2022 1605   BUN 20.6 09/04/2017 1436   CREATININE 2.12 (H) 01/04/2023 0549   CREATININE 1.53 (H) 01/22/2022 1457   CREATININE 1.4 (H) 09/04/2017 1436   CALCIUM 6.8 (L) 01/04/2023 0549   CALCIUM 9.4 09/04/2017 1436   GFRNONAA 23 (L) 01/04/2023 0549   GFRNONAA 34 (L) 01/22/2022 1457   GFRAA 39 (L) 10/04/2020  1404   GFRAA 39 (L) 03/29/2019 1459    INR    Component Value Date/Time   INR 1.1 12/08/2021 1251   INR 1.10 (L) 02/29/2016 1057     Intake/Output Summary (Last 24 hours) at 01/04/2023 0957 Last data filed at 01/04/2023 0702 Gross per 24 hour  Intake 1188.89 ml  Output 750 ml  Net 438.89 ml     Assessment/plan:  82 y.o. [name] is now status post left great toe amputation.  Previous duplex and ABI demonstrated concern for restenosis of the left SFA and popliteal arteries and we will plan for angiogram with stenting tomorrow in the Cath Lab.  N.p.o. past midnight.    Lakenzie Mcclafferty C. Donzetta Matters, MD [facility] Office: [phone] Pager: [phone]  01/04/2023 9:57 AM

## 2023-01-04 NOTE — Progress Notes (Signed)
PROGRESS NOTE        PATIENT DETAILS Name: UMEKA [name] Age: 82 y.o. Sex: female Date of Birth: 1941-01-14 Admit Date: 12/31/2022 Admitting Physician Evalee Mutton Kristeen Mans, MD WEX:[id number] [name], MD  Brief Summary: Patient is a 82 y.o.  female with medical history significant of PAD with critical limb ischemia (dry gangrene of left great toe), chronic HFpEF, PAF on Eliquis, CAD (managed medically-Plavix/statin) CKD stage IV, DM-2, Keppra, history of AML (in remission)-sent from PCPs office for evaluation of worsening lower extremity edema/hypoxemia-found to have acute on chronic HFpEF and left leg critical limb ischemia with dry gangrene of left great toe.   Significant events: 12/30-1/18>> hospitalization for left lower limb critical ischemia-s/p angiogram with balloon angioplasty-treated for UTI/hypokalemia/AKI/HFpEF exacerbation.  Discharged home with home health.   1/25>> sent to ED by PCP-for SOB/worsening leg swelling-HFpEF exacerbation, dry gangrene of left great toe.  Significant studies: 1/1>>1, 25 vitamin D:17.2 (low) 1/1>>25 vitamin D:19.58 (low) 1/3>>PTH 307 (high) 1/25>> CXR: Pulmonary vascular congestion, small bilateral pleural effusions 1/25>> lower extremity arterial duplex: 50-74% stenosis left superficial femoral artery, left popliteal artery and left posterior tibial artery  1/26.  ABIs. Duplex and ABIs reviewed appears to have restenosis of the left SFA and will need likely stenting on Monday.    Significant microbiology data: 1/24>> blood culture: No growth 1/25>> COVID/influenza/RSV PCR: Negative  Procedures: 1. Partial first ray amputation left by podiatrist Dr. Amalia Hailey on 01/03/2023  Consults: Vascular surgery Podiatry  Subjective: Patient in bed, appears comfortable, denies any headache, no fever, no chest pain or pressure, no shortness of breath , no abdominal pain. No focal weakness.  Objective: Vitals: Blood  pressure (!) 152/60, pulse 81, temperature (!) 97.3 F (36.3 C), temperature source Oral, resp. rate 20, height '5\' 2"'$  (1.575 m), weight 71.8 kg, SpO2 100 %.   Exam:  Awake Alert, No new F.N deficits, Normal affect [place].AT,PERRAL Supple Neck, No JVD,   Symmetrical Chest wall movement, Good air movement bilaterally, CTAB RRR,No Gallops, Rubs or new Murmurs,  +ve B.Sounds, Abd Soft, No tenderness,   Left foot under bandage, 1+ leg edema   Assessment/Plan:  Acute hypoxic respiratory failure 2 acute on chronic diastolic CHF EF 93%. Due to HFpEF exacerbation Remains on IV Lasix, monitor closely. On minimal amount of oxygen-titrate to room air over the next few days. Discussed with nursing staff regarding strict intake/output.   Follow daily weights/electrolytes   Critical limb ischemia-left toe gangrene - seen by both vascular surgery along with podiatry, S/p arteriogram on 1/17 with balloon angioplasty of ostial SFA lesion, proximal SFA lesion, Arterial Doppler on 1/25 Duplex and ABIs reviewed appears to have restenosis of the left SFA, per vascular surgery likely stenting on 01/05/2023, she underwent Partial first ray amputation left on 01/03/2023 by podiatrist Dr. Amalia Hailey.  Agreeable for blood transfusion if needed.   Chronic left hip pain with acute postop left foot pain.  Pain medications adjusted.  She is chronically on narcotics.  Hypocalcemia  Recent PTH levels appropriately elevated, vitamin D levels low Just started on calcitriol/Os-Cal yesterday-will increase calcitriol to 0.5 mcg today, change Os-Cal to 3 times daily dosing IV calcium given on 01/03/2023. Replace IV calcium on 01/04/2023  Hypomagnesemia Replaced   CKD stage IV, baseline creatinine around 2.2 At baseline Follow- electrolytes closely   Normocytic anemia Due to CKD No evidence  of blood loss Follow CBC closely   PAF Continue metoprolol Eliquis on hold-on IV heparin-in anticipation of left great toe amputation  over the weekend.    CAD Treated medically-continue Plavix/statin/beta-blocker No current anginal symptoms   HLD Statin   Seizure disorder Keppra   History of AML-in remission Follows with Dr. [name] as an outpatient  DM-2 (A1c 9.9 on 08/13/2022) Poor glycemic morning of 01/03/2023, oral intake quite spotty hold all insulin for now and monitor on just low-dose before every meal sliding scale.  Recent Labs    01/04/23 0606 01/04/23 0640 01/04/23 0700  GLUCAP 95* 63* 151*       Palliative care Very frail DNR in place Multiple medical issues as above-second hospitalization within a week from discharge At risk for further decompensation Have placed palliative care consultation for goals of care-suspect may benefit from establishment of home hospice on discharge.   BMI: Estimated body mass index is 28.95 kg/m as calculated from the following:   Height as of this encounter: '5\' 2"'$  (1.575 m).   Weight as of this encounter: 71.8 kg.   Code status:   Code Status: DNR   DVT Prophylaxis: IV heparin SCDs Start: 01/03/23 1525 Place TED hose Start: 01/03/23 0919 Family Communication: Daughter-Penny-[phone]-left VM 1/26   Disposition Plan: Status is: Inpatient Remains inpatient appropriate because: Severity of illness.   Planned Discharge Destination:Home   Diet: Diet Order             Diet heart healthy/carb modified Room service appropriate? Yes; Fluid consistency: Thin; Fluid restriction: 1500 mL Fluid  Diet effective now                     Antimicrobial agents: Anti-infectives (From admission, onward)    None        MEDICATIONS: Scheduled Meds:  ascorbic acid  1,000 mg Oral Daily   atorvastatin  40 mg Oral Daily   calcitRIOL  0.5 mcg Oral Daily   calcium acetate  1,334 mg Oral TID WC   calcium carbonate  1,250 mg Oral TID WC   clopidogrel  75 mg Oral QPM   fluticasone furoate-vilanterol  1 puff Inhalation Daily   furosemide  80 mg  Intravenous Q12H   isosorbide mononitrate  30 mg Oral Daily   levETIRAcetam  500 mg Oral BID   melatonin  3 mg Oral QHS   metoprolol succinate  12.5 mg Oral Daily   polyethylene glycol  17 g Oral Daily   potassium chloride SA  20 mEq Oral Daily   Continuous Infusions:  heparin 850 Units/hr (01/04/23 0703)   PRN Meds:.acetaminophen **OR** acetaminophen, albuterol, dextrose, hydrALAZINE, HYDROmorphone (DILAUDID) injection, LORazepam, naLOXone (NARCAN)  injection, nitroGLYCERIN, ondansetron **OR** ondansetron (ZOFRAN) IV, oxyCODONE-acetaminophen   I have personally reviewed following labs and imaging studies  LABORATORY DATA:  Recent Labs  Lab 12/31/22 1805 01/02/23 0712 01/03/23 0205 01/04/23 0549  WBC 5.8 7.5 7.2 10.8*  HGB 9.6* 8.2* 7.8* 9.4*  HCT 30.8* 25.7* 23.9* 28.7*  PLT 156 122* 110* 146*  MCV 99.7 97.0 95.6 95.7  MCH 31.1 30.9 31.2 31.3  MCHC 31.2 31.9 32.6 32.8  RDW 17.2* 17.2* 17.1* 17.0*  LYMPHSABS 0.8  --   --   --   MONOABS 0.4  --   --   --   EOSABS 0.0  --   --   --   BASOSABS 0.0  --   --   --     Recent Labs  Lab 12/31/22 1805 01/01/23 0205 01/01/23 0411 01/02/23 0556 01/03/23 0205 01/04/23 0549  NA 140  --   --  141 140 139  K 4.0  --   --  3.9 3.9 3.9  CL 102  --   --  104 102 103  CO2 27  --   --  '28 29 25  '$ ANIONGAP 11  --   --  '9 9 11  '$ GLUCOSE 244*  --   --  335* 64* 38*  BUN 85*  --   --  80* 83* 81*  CREATININE 2.38*  --   --  2.09* 2.20* 2.12*  AST  --   --   --   --  26 28  ALT  --   --   --   --  14 14  ALKPHOS  --   --   --   --  99 105  BILITOT  --   --   --   --  0.4 0.6  ALBUMIN  --   --   --  2.2* 2.0* 2.2*  LATICACIDVEN 1.2 0.7  --   --   --   --   BNP  --   --  881.2*  --  742.1* 572.6*  MG  --   --   --  1.5* 2.3 2.0  CALCIUM 6.2*  --   --  5.8* 6.4* 6.8*    RADIOLOGY STUDIES/RESULTS: DG Foot Complete Left  Result Date: 01/03/2023 CLINICAL DATA:  Postoperative. EXAM: LEFT FOOT - COMPLETE 3+ VIEW COMPARISON:   Preoperative radiograph 12/31/2022 FINDINGS: Transmetatarsal amputation of the first ray with resection of the first toe. Expected postsurgical change in the soft tissues. No evidence of acute fracture. Plantar calcaneal spur and Achilles tendon enthesophyte. IMPRESSION: Transmetatarsal amputation of the first ray with resection of the first toe. No immediate postoperative complication. Electronically Signed   By: Keith Rake M.D.   On: 01/03/2023 18:39     LOS: 3 days   Signature  -    Lala Lund M.D on 01/04/2023 at 8:40 AM   -  To page go to [email or web address]

## 2023-01-04 NOTE — Evaluation (Addendum)
Occupational Therapy Evaluation Patient Details Name: [name] MRN: [id number] DOB: [birth year] Today's Date: 01/04/2023   History of Present Illness Pt is an 82 y/o F presenting to ED on 1/24 from PCP for evaluation of worsening LLE edema, found to have acute on chronic HFpEF and LLE ischemia with dry gangrene of L great toe. S/p L first partial ray amputation on 1/27. PMH includes PAD with critical limb ischemia, chronic HFpEF, PAF on Eliquis, CAD, CKD IV, DM2   Clinical Impression   Pt reports using RW at baseline, has needed assist for LB dressing and lives at home with spouse. Pt currently limited by LLE pain and precautions. Pt currently needing set up -max A for ADLs, min A for bed mobility, and mod A for squat pivot transfer to/from [facility]. Pt needing cues to adhere to WB through heel only, cues for safety as pt attempting to sit prior to clearing arm of BSC. Pt presenting with impairments listed below, will follow acutely. Recommend SNF at d/c pending progression.     Recommendations for follow up therapy are one component of a multi-disciplinary discharge planning process, led by the attending physician.  Recommendations may be updated based on patient status, additional functional criteria and insurance authorization.   Follow Up Recommendations  Skilled nursing-short term rehab (<3 hours/day)     Assistance Recommended at Discharge Frequent or constant Supervision/Assistance  Patient can return home with the following A lot of help with walking and/or transfers;A lot of help with bathing/dressing/bathroom;Assistance with cooking/housework;Assist for transportation;Help with stairs or ramp for entrance    Functional Status Assessment  Patient has had a recent decline in their functional status and demonstrates the ability to make significant improvements in function in a reasonable and predictable amount of time.  Equipment Recommendations  BSC/3in1    Recommendations for Other  Services PT consult     Precautions / Restrictions Precautions Precautions: Fall Precaution Comments: watch O2 Restrictions Weight Bearing Restrictions: Yes LLE Weight Bearing: Partial weight bearing LLE Partial Weight Bearing Percentage or Pounds: PWB Heel only, post-op shoe not needed per Dr. Amalia Hailey 1/28 via securechat at 0910am     Mobility Bed Mobility Overal bed mobility: Needs Assistance Bed Mobility: Supine to Sit, Sit to Supine     Supine to sit: Min assist Sit to supine: Min assist   General bed mobility comments: assist to bring BLE's into bed    Transfers Overall transfer level: Needs assistance Equipment used: Rolling walker (2 wheels) Transfers: Sit to/from Stand, Bed to chair/wheelchair/BSC Sit to Stand: Mod assist   Squat pivot transfers: Mod assist       General transfer comment: mod A with cues for safety, pt attempting to sit prior to clearing BSC. pushing with L hand on bed to offload LLE pressure,      Balance Overall balance assessment: Needs assistance Sitting-balance support: No upper extremity supported, Feet supported Sitting balance-Leahy Scale: Good     Standing balance support: Bilateral upper extremity supported, Reliant on assistive device for balance, During functional activity Standing balance-Leahy Scale: Poor Standing balance comment: heavy reliance on RW                           ADL either performed or assessed with clinical judgement   ADL Overall ADL's : Needs assistance/impaired Eating/Feeding: Set up   Grooming: Minimal assistance   Upper Body Bathing: Moderate assistance   Lower Body Bathing: Maximal assistance   Upper  Body Dressing : Moderate assistance   Lower Body Dressing: Maximal assistance   Toilet Transfer: Moderate assistance;Squat-pivot;BSC/3in1   Toileting- Clothing Manipulation and Hygiene: Maximal assistance Toileting - Clothing Manipulation Details (indicate cue type and reason):  pericare in sitting     Functional mobility during ADLs: Moderate assistance;Rolling walker (2 wheels)       Vision   Vision Assessment?: No apparent visual deficits     Perception Perception Perception Tested?: No   Praxis Praxis Praxis tested?: Not tested    Pertinent Vitals/Pain Pain Assessment Pain Assessment: Faces Pain Score: 5  Faces Pain Scale: Hurts little more Pain Location: LLE Pain Descriptors / Indicators: Discomfort Pain Intervention(s): Limited activity within patient's tolerance, Monitored during session, Repositioned, Patient requesting pain meds-RN notified     Hand Dominance Right   Extremity/Trunk Assessment Upper Extremity Assessment Upper Extremity Assessment: Generalized weakness   Lower Extremity Assessment Lower Extremity Assessment: Defer to PT evaluation   Cervical / Trunk Assessment Cervical / Trunk Assessment: Kyphotic   Communication Communication Communication: No difficulties   Cognition Arousal/Alertness: Awake/alert Behavior During Therapy: WFL for tasks assessed/performed, Anxious Overall Cognitive Status: Impaired/Different from baseline Area of Impairment: Memory, Following commands, Safety/judgement                     Memory: Decreased short-term memory Following Commands: Follows one step commands with increased time Safety/Judgement: Decreased awareness of safety     General Comments: anxious, fearful regarding mobility. Needs repetition/increased cues to perform simple tasks     General Comments  VSS on supplemental O2    Exercises     Shoulder Instructions      Home Living Family/patient expects to be discharged to:: Private residence Living Arrangements: Spouse/significant other Available Help at Discharge: Family;Available 24 hours/day Type of Home: House Home Access: Stairs to enter [employer] of Steps: 1  step Entrance Stairs-Rails: Can reach both Home Layout: One level      Bathroom Shower/Tub: Teacher, early years/pre: Handicapped height     Home Equipment: Conservation officer, nature (2 wheels);Tub bench;Wheelchair - manual          Prior Functioning/Environment Prior Level of Function : Independent/Modified Independent;Working/employed;Driving             Mobility Comments: uses RW ADLs Comments: reports ind, however previous admission needed assist for LB dressing        OT Problem List: Decreased strength;Decreased range of motion;Decreased activity tolerance;Impaired balance (sitting and/or standing);Decreased safety awareness      OT Treatment/Interventions: Self-care/ADL training;Therapeutic exercise;Energy conservation;DME and/or AE instruction;Therapeutic activities;Patient/family education;Balance training    OT Goals(Current goals can be found in the care plan section) Acute Rehab OT Goals Patient Stated Goal: none stated OT Goal Formulation: With patient Time For Goal Achievement: 01/18/23 Potential to Achieve Goals: Good ADL Goals Pt Will Perform Upper Body Dressing: sitting;with min guard assist Pt Will Perform Lower Body Dressing: with min assist;with adaptive equipment;sitting/lateral leans;sit to/from stand Pt Will Transfer to Toilet: with min guard assist;ambulating;regular height toilet Pt Will Perform Tub/Shower Transfer: Tub transfer;Shower transfer;ambulating;with min assist;rolling walker;3 in 1 Additional ADL Goal #1: Pt will be able to stand x5 min for functional task in prep for ADLs  OT Frequency: Min 2X/week    Co-evaluation              AM-PAC OT "6 Clicks" Daily Activity     Outcome Measure Help from another person eating meals?: None Help from another person taking care  of personal grooming?: A Little Help from another person toileting, which includes using toliet, bedpan, or urinal?: A Lot Help from another person bathing (including washing, rinsing, drying)?: A Lot Help from another person to put on  and taking off regular upper body clothing?: A Lot Help from another person to put on and taking off regular lower body clothing?: A Lot 6 Click Score: 15   End of Session Equipment Utilized During Treatment: Gait belt;Rolling walker (2 wheels);Oxygen Nurse Communication: Mobility status;Patient requests pain meds;Other (comment);Weight bearing status (pt needs a new purewick)  Activity Tolerance: Patient tolerated treatment well Patient left: in bed;with call bell/phone within reach;with bed alarm set;with family/visitor present  OT Visit Diagnosis: Unsteadiness on feet (R26.81);Other abnormalities of gait and mobility (R26.89);Muscle weakness (generalized) (M62.81)                Time: 2103-1281 OT Time Calculation (min): 26 min Charges:  OT General Charges $OT Visit: 1 Visit OT Evaluation $OT Eval Moderate Complexity: 1 Mod OT Treatments $Self Care/Home Management : 8-22 mins  Renaye Rakers, OTD, OTR/L SecureChat Preferred Acute Rehab [phone]  Renaye Rakers [place] 01/04/2023, 3:19 PM

## 2023-01-04 NOTE — Progress Notes (Signed)
    Subjective: 1 Day Post-Op Procedure(s) (LRB): AMPUTATION LEFT GREAT TOE with Partial First Ray Amputation (Left) Procedure: Partial first ray amputation left DOS: 01/03/2023  Patient doing well.  Resting comfortably in bed.  Relates pain to the left hip.  Objective: Vital signs in last 24 hours: Temp:  [97 F (36.1 C)-98.7 F (37.1 C)] 97 F (36.1 C) (01/28 1100) Pulse Rate:  [81-108] 94 (01/28 1637) Resp:  [14-20] 18 (01/28 1637) BP: (104-152)/(48-71) 126/67 (01/28 1637) SpO2:  [93 %-100 %] 100 % (01/28 1637) Weight:  [71.8 kg] 71.8 kg (01/28 0424)   Recent Labs    01/02/23 0712 01/03/23 0205 01/04/23 0549  HGB 8.2* 7.8* 9.4*   Recent Labs    01/03/23 0205 01/04/23 0549  WBC 7.2 10.8*  RBC 2.50* 3.00*  HCT 23.9* 28.7*  PLT 110* 146*   Recent Labs    01/03/23 0205 01/04/23 0549  NA 140 139  K 3.9 3.9  CL 102 103  CO2 29 25  BUN 83* 81*  CREATININE 2.20* 2.12*  GLUCOSE 64* 38*  CALCIUM 6.4* 6.8*    POD #1 LT foot  Dressings are clean and dry.  No strikethrough.  Skin incision well coapted.  Sutures intact.  No appreciable erythema or edema around the foot.  Skin is warm to touch.  Assessment/Plan: 1 Day Post-Op Procedure(s) (LRB): AMPUTATION LEFT GREAT TOE with Partial First Ray Amputation (Left) DOS: 01/03/2023 -Dressings changed.  Leave clean dry and intact. -WBAT heel pressure in a postsurgical shoe.  Order placed for his postsurgical shoe.  -Plan for abdominal aortogram with lower extremity tomorrow, 01/05/2023 -From a podiatry standpoint the foot is stable.  Okay for discharge -Will follow peripherally and change the dressing later this week if she remains inpatient.  Otherwise leave dressings clean dry and intact until follow-up in the office 1 week post discharge  Edrick Kins 01/04/2023, 4:43 PM  Edrick Kins, DPM [facility]  Dr. Edrick Kins, DPM    [address],  [place] [address]                Office [phone]  Fax [phone]

## 2023-01-04 NOTE — Progress Notes (Signed)
[facility] night cross cover note:   I was notified by RN that patient is c/o poorly controlled pain relating to her left foot following partial left first ray amputation on 1/27 in setting gangrene of left great toe.  Per my chart review, her current pain medication regimen includes Dilaudid 1 mg IV every 6 hours prn as well as Percocet 5/325 mg, 1-2 tabs PO q6h prn.   She recently had a dose of Dilaudid 1 mg IV, which was preceded by 1 tab of the Percocet around 2200.   Per the existing pain med orders, I asked patient's RN to please give another 1 tab of the Percocet 5/325 mg p.o. x 1 dose now.     Babs Bertin, DO Hospitalist

## 2023-01-04 NOTE — Progress Notes (Addendum)
TR[facility] night cross cover note:   I was notified by RN of CBG of 29 this AM. Pt was clammy at the time, but otherwise asymptomatic. Received 8 oz orange juice, with repeat CBG currently pending.   Does not appear to be any basal insulin.  Has orders for sliding scale insulin, but does not appear to have received any sliding scale coverage over the last day.    Babs Bertin, DO Hospitalist

## 2023-01-04 NOTE — Plan of Care (Signed)
  Problem: Education: Goal: Understanding of CV disease, CV risk reduction, and recovery process will improve Outcome: Progressing Goal: Individualized Educational Video(s) Outcome: Progressing   Problem: Activity: Goal: Ability to return to baseline activity level will improve Outcome: Progressing   Problem: Cardiovascular: Goal: Ability to achieve and maintain adequate cardiovascular perfusion will improve Outcome: Progressing Goal: Vascular access site(s) Level 0-1 will be maintained Outcome: Progressing   Problem: Health Behavior/Discharge Planning: Goal: Ability to safely manage health-related needs after discharge will improve Outcome: Progressing   Problem: Education: Goal: Ability to demonstrate management of disease process will improve Outcome: Progressing Goal: Ability to verbalize understanding of medication therapies will improve Outcome: Progressing Goal: Individualized Educational Video(s) Outcome: Progressing   Problem: Activity: Goal: Capacity to carry out activities will improve Outcome: Progressing   Problem: Cardiac: Goal: Ability to achieve and maintain adequate cardiopulmonary perfusion will improve Outcome: Progressing   Problem: Education: Goal: Ability to describe self-care measures that may prevent or decrease complications (Diabetes Survival Skills Education) will improve Outcome: Progressing Goal: Individualized Educational Video(s) Outcome: Progressing   Problem: Coping: Goal: Ability to adjust to condition or change in health will improve Outcome: Progressing   Problem: Fluid Volume: Goal: Ability to maintain a balanced intake and output will improve Outcome: Progressing   Problem: Health Behavior/Discharge Planning: Goal: Ability to identify and utilize available resources and services will improve Outcome: Progressing Goal: Ability to manage health-related needs will improve Outcome: Progressing   Problem: Metabolic: Goal:  Ability to maintain appropriate glucose levels will improve Outcome: Progressing   Problem: Nutritional: Goal: Maintenance of adequate nutrition will improve Outcome: Progressing Goal: Progress toward achieving an optimal weight will improve Outcome: Progressing   Problem: Skin Integrity: Goal: Risk for impaired skin integrity will decrease Outcome: Progressing   Problem: Tissue Perfusion: Goal: Adequacy of tissue perfusion will improve Outcome: Progressing   Problem: Education: Goal: Knowledge of General Education information will improve Description: Including pain rating scale, medication(s)/side effects and non-pharmacologic comfort measures Outcome: Progressing   Problem: Health Behavior/Discharge Planning: Goal: Ability to manage health-related needs will improve Outcome: Progressing   Problem: Clinical Measurements: Goal: Ability to maintain clinical measurements within normal limits will improve Outcome: Progressing Goal: Will remain free from infection Outcome: Progressing Goal: Diagnostic test results will improve Outcome: Progressing Goal: Respiratory complications will improve Outcome: Progressing Goal: Cardiovascular complication will be avoided Outcome: Progressing   Problem: Activity: Goal: Risk for activity intolerance will decrease Outcome: Progressing   Problem: Nutrition: Goal: Adequate nutrition will be maintained Outcome: Progressing   Problem: Coping: Goal: Level of anxiety will decrease Outcome: Progressing   Problem: Elimination: Goal: Will not experience complications related to bowel motility Outcome: Progressing Goal: Will not experience complications related to urinary retention Outcome: Progressing   Problem: Pain Managment: Goal: General experience of comfort will improve Outcome: Progressing   Problem: Safety: Goal: Ability to remain free from injury will improve Outcome: Progressing   Problem: Skin Integrity: Goal: Risk  for impaired skin integrity will decrease Outcome: Progressing

## 2023-01-05 ENCOUNTER — Encounter (HOSPITAL_COMMUNITY)
Admission: EM | Disposition: A | Discharge status: Skilled Nursing Facility | Payer: Self-pay | Source: Ambulatory Visit | Attending: Internal Medicine

## 2023-01-05 ENCOUNTER — Inpatient Hospital Stay: Payer: Medicare HMO

## 2023-01-05 DIAGNOSIS — I70262 Atherosclerosis of native arteries of extremities with gangrene, left leg: Secondary | ICD-10-CM

## 2023-01-05 DIAGNOSIS — I5033 Acute on chronic diastolic (congestive) heart failure: Secondary | ICD-10-CM | POA: Diagnosis not present

## 2023-01-05 HISTORY — PX: PERIPHERAL VASCULAR INTERVENTION: CATH118257

## 2023-01-05 HISTORY — PX: ABDOMINAL AORTOGRAM W/LOWER EXTREMITY: CATH118223

## 2023-01-05 LAB — GLUCOSE, CAPILLARY
Glucose-Capillary: 219 mg/dL — ABNORMAL HIGH (ref 70–99)
Glucose-Capillary: 213 mg/dL — ABNORMAL HIGH (ref 70–99)
Glucose-Capillary: 270 mg/dL — ABNORMAL HIGH (ref 70–99)
Glucose-Capillary: 313 mg/dL — ABNORMAL HIGH (ref 70–99)
Glucose-Capillary: 338 mg/dL — ABNORMAL HIGH (ref 70–99)

## 2023-01-05 LAB — COMPREHENSIVE METABOLIC PANEL
ALT: 11 U/L (ref 0–44)
AST: 21 U/L (ref 15–41)
Albumin: 2.1 g/dL — ABNORMAL LOW (ref 3.5–5.0)
Alkaline Phosphatase: 113 U/L (ref 38–126)
Anion gap: 10 (ref 5–15)
BUN: 75 mg/dL — ABNORMAL HIGH (ref 8–23)
CO2: 26 mmol/L (ref 22–32)
Calcium: 7.3 mg/dL — ABNORMAL LOW (ref 8.9–10.3)
Chloride: 102 mmol/L (ref 98–111)
Creatinine, Ser: 2.29 mg/dL — ABNORMAL HIGH (ref 0.44–1.00)
GFR, Estimated: 21 mL/min — ABNORMAL LOW (ref >60)
Glucose, Bld: 274 mg/dL — ABNORMAL HIGH (ref 70–99)
Potassium: 5.1 mmol/L (ref 3.5–5.1)
Sodium: 138 mmol/L (ref 135–145)
Total Bilirubin: 0.4 mg/dL (ref 0.3–1.2)
Total Protein: 4.7 g/dL — ABNORMAL LOW (ref 6.5–8.1)

## 2023-01-05 LAB — CBC
HCT: 25.7 % — ABNORMAL LOW (ref 36.0–46.0)
Hemoglobin: 8.3 g/dL — ABNORMAL LOW (ref 12.0–15.0)
MCH: 31.3 pg (ref 26.0–34.0)
MCHC: 32.3 g/dL (ref 30.0–36.0)
MCV: 97 fL (ref 80.0–100.0)
Platelets: 92 10*3/uL — ABNORMAL LOW (ref 150–400)
RBC: 2.65 MIL/uL — ABNORMAL LOW (ref 3.87–5.11)
RDW: 16.8 % — ABNORMAL HIGH (ref 11.5–15.5)
WBC: 5.1 10*3/uL (ref 4.0–10.5)
nRBC: 0 % (ref 0.0–0.2)

## 2023-01-05 LAB — CULTURE, BLOOD (ROUTINE X 2)
Culture: NO GROWTH
Special Requests: ADEQUATE

## 2023-01-05 LAB — POCT ACTIVATED CLOTTING TIME
Activated Clotting Time: 163 seconds
Activated Clotting Time: 277 seconds

## 2023-01-05 LAB — BRAIN NATRIURETIC PEPTIDE: B Natriuretic Peptide: 680.6 pg/mL — ABNORMAL HIGH (ref 0.0–100.0)

## 2023-01-05 LAB — APTT: aPTT: 156 seconds — ABNORMAL HIGH (ref 24–36)

## 2023-01-05 LAB — MAGNESIUM: Magnesium: 1.9 mg/dL (ref 1.7–2.4)

## 2023-01-05 LAB — HEPARIN LEVEL (UNFRACTIONATED): Heparin Unfractionated: 0.49 IU/mL (ref 0.30–0.70)

## 2023-01-05 SURGERY — ABDOMINAL AORTOGRAM W/LOWER EXTREMITY
Anesthesia: LOCAL

## 2023-01-05 MED ORDER — HYDRALAZINE HCL 20 MG/ML IJ SOLN: 5.0000 mg | INTRAMUSCULAR | Status: DC | PRN

## 2023-01-05 MED ORDER — LIDOCAINE HCL (PF) 1 % IJ SOLN
INTRAMUSCULAR | Status: AC
  Filled 2023-01-05: qty 30

## 2023-01-05 MED ORDER — SODIUM CHLORIDE 0.9% FLUSH
3.0000 mL | Freq: Two times a day (BID) | INTRAVENOUS | Status: DC
  Administered 2023-01-06: 3 mL via INTRAVENOUS

## 2023-01-05 MED ORDER — SODIUM CHLORIDE 0.9 % IV SOLN
4.0000 g | Freq: Once | INTRAVENOUS | Status: AC
  Administered 2023-01-05: 4 g via INTRAVENOUS
  Filled 2023-01-05: qty 40

## 2023-01-05 MED ORDER — HEPARIN (PORCINE) IN NACL 1000-0.9 UT/500ML-% IV SOLN
INTRAVENOUS | Status: DC | PRN
  Administered 2023-01-05 (×2): 500 mL

## 2023-01-05 MED ORDER — ACETAMINOPHEN 325 MG PO TABS
650.0000 mg | ORAL_TABLET | ORAL | Status: DC | PRN
  Administered 2023-01-05 – 2023-01-07 (×2): 650 mg via ORAL
  Filled 2023-01-05: qty 2

## 2023-01-05 MED ORDER — SODIUM CHLORIDE 0.9 % IV SOLN: 250.0000 mL | INTRAVENOUS | Status: DC | PRN

## 2023-01-05 MED ORDER — INSULIN ASPART 100 UNIT/ML IJ SOLN
5.0000 [IU] | Freq: Once | INTRAMUSCULAR | Status: AC
  Administered 2023-01-05: 5 [IU] via SUBCUTANEOUS

## 2023-01-05 MED ORDER — FENTANYL CITRATE (PF) 100 MCG/2ML IJ SOLN
INTRAMUSCULAR | Status: DC | PRN
  Administered 2023-01-05: 25 ug via INTRAVENOUS
  Administered 2023-01-05: 50 ug via INTRAVENOUS

## 2023-01-05 MED ORDER — FENTANYL CITRATE (PF) 100 MCG/2ML IJ SOLN
INTRAMUSCULAR | Status: AC
  Filled 2023-01-05: qty 2

## 2023-01-05 MED ORDER — CLOPIDOGREL BISULFATE 75 MG PO TABS
ORAL_TABLET | ORAL | Status: AC
  Filled 2023-01-05: qty 1

## 2023-01-05 MED ORDER — SODIUM CHLORIDE 0.9 % IV SOLN: INTRAVENOUS | Status: DC

## 2023-01-05 MED ORDER — HEPARIN SODIUM (PORCINE) 1000 UNIT/ML IJ SOLN
INTRAMUSCULAR | Status: AC
  Filled 2023-01-05: qty 10

## 2023-01-05 MED ORDER — MIDAZOLAM HCL 2 MG/2ML IJ SOLN
INTRAMUSCULAR | Status: AC
  Filled 2023-01-05: qty 2

## 2023-01-05 MED ORDER — IODIXANOL 320 MG/ML IV SOLN
INTRAVENOUS | Status: DC | PRN
  Administered 2023-01-05: 40 mL via INTRA_ARTERIAL

## 2023-01-05 MED ORDER — HEPARIN SODIUM (PORCINE) 1000 UNIT/ML IJ SOLN
INTRAMUSCULAR | Status: DC | PRN
  Administered 2023-01-05: 8000 [IU] via INTRAVENOUS

## 2023-01-05 MED ORDER — LIDOCAINE HCL (PF) 1 % IJ SOLN
INTRAMUSCULAR | Status: DC | PRN
  Administered 2023-01-05: 15 mL

## 2023-01-05 MED ORDER — HYDROMORPHONE HCL 1 MG/ML IJ SOLN
INTRAMUSCULAR | Status: AC
  Filled 2023-01-05: qty 1

## 2023-01-05 MED ORDER — LABETALOL HCL 5 MG/ML IV SOLN: 10.0000 mg | INTRAVENOUS | Status: DC | PRN

## 2023-01-05 MED ORDER — MIDAZOLAM HCL 2 MG/2ML IJ SOLN
INTRAMUSCULAR | Status: DC | PRN
  Administered 2023-01-05: 1 mg via INTRAVENOUS

## 2023-01-05 MED ORDER — PROTAMINE SULFATE 10 MG/ML IV SOLN
INTRAVENOUS | Status: DC | PRN
  Administered 2023-01-05: 50 mg via INTRAVENOUS

## 2023-01-05 MED ORDER — ONDANSETRON HCL 4 MG/2ML IJ SOLN: 4.0000 mg | Freq: Four times a day (QID) | INTRAMUSCULAR | Status: DC | PRN

## 2023-01-05 MED ORDER — SODIUM CHLORIDE 0.9 % WEIGHT BASED INFUSION
1.0000 mL/kg/h | INTRAVENOUS | Status: AC
  Administered 2023-01-05: 1 mL/kg/h via INTRAVENOUS

## 2023-01-05 MED ORDER — SODIUM CHLORIDE 0.9% FLUSH: 3.0000 mL | INTRAVENOUS | Status: DC | PRN

## 2023-01-05 MED ORDER — HEPARIN SODIUM (PORCINE) 5000 UNIT/ML IJ SOLN
5000.0000 [IU] | Freq: Three times a day (TID) | INTRAMUSCULAR | Status: DC
  Administered 2023-01-05 – 2023-01-06 (×2): 5000 [IU] via SUBCUTANEOUS
  Filled 2023-01-05 (×2): qty 1

## 2023-01-05 MED ORDER — PROTAMINE SULFATE 10 MG/ML IV SOLN
INTRAVENOUS | Status: AC
  Filled 2023-01-05: qty 5

## 2023-01-05 SURGICAL SUPPLY — 17 items
BALLN MUSTANG 5X150X135 (BALLOONS) ×2
BALLOON MUSTANG 5X150X135 (BALLOONS) IMPLANT
CATH OMNI FLUSH 5F 65CM (CATHETERS) IMPLANT
GLIDEWIRE ADV .035X260CM (WIRE) IMPLANT
GUIDEWIRE ANGLED .035X150CM (WIRE) IMPLANT
KIT ANGIASSIST CO2 SYSTEM (KITS) IMPLANT
KIT ENCORE 26 ADVANTAGE (KITS) IMPLANT
KIT MICROPUNCTURE NIT STIFF (SHEATH) IMPLANT
KIT PV (KITS) ×3 IMPLANT
SHEATH CATAPULT 6F 45 MP (SHEATH) IMPLANT
SHEATH PINNACLE 5F 10CM (SHEATH) IMPLANT
SHEATH PINNACLE 6F 10CM (SHEATH) IMPLANT
STENT ELUVIA 6X150X130 (Permanent Stent) IMPLANT
STENT ELUVIA 6X80X130 (Permanent Stent) IMPLANT
TRANSDUCER W/STOPCOCK (MISCELLANEOUS) ×3 IMPLANT
TRAY PV CATH (CUSTOM PROCEDURE TRAY) ×3 IMPLANT
WIRE BENTSON .035X145CM (WIRE) IMPLANT

## 2023-01-05 NOTE — Progress Notes (Signed)
ANTICOAGULATION CONSULT NOTE  Pharmacy Consult for heparin  Indication: atrial fibrillation  Allergies  Allergen Reactions   Chlorhexidine Gluconate Itching and Rash    Patient Measurements: Height: '5\' 2"'$  (157.5 cm) Weight: 71.8 kg (158 lb 4.6 oz) IBW/kg (Calculated) : 50.1 HEPARIN DW (KG): 65.1   Vital Signs: Temp: 98.4 F (36.9 C) (01/29 0004) Temp Source: Oral (01/29 0004) BP: 138/52 (01/29 0004) Pulse Rate: 97 (01/29 0004)  Labs: Recent Labs    01/03/23 0205 01/03/23 1500 01/04/23 0549 01/04/23 1528 01/05/23 0031  HGB 7.8*  --  9.4*  --  8.3*  HCT 23.9*  --  28.7*  --  25.7*  PLT 110*  --  146*  --  92*  APTT >200*   < > 52* 58* 156*  HEPARINUNFRC >1.10*  --  0.33  --  0.49  CREATININE 2.20*  --  2.12*  --  2.29*   < > = values in this interval not displayed.     Estimated Creatinine Clearance: 17.9 mL/min (A) (by C-G formula based on SCr of 2.29 mg/dL (H)).  Assessment: 82 y.o. female admitted with CHF exacerbation, h/o Afib and Eliquis on hold, on IV heparin -aPTT= 58 on 850 units/hr  1/29 AM update:  Heparin level therapeutic DC aPTT's  Goal of Therapy:  Heparin level 0.3-0.7 units/mL Monitor platelets by anticoagulation protocol: Yes   Plan:  Cont heparin 1000 units/hr 1000 heparin level  Narda Bonds, PharmD, BCPS Clinical Pharmacist Phone: [phone]

## 2023-01-05 NOTE — Progress Notes (Signed)
PT Cancellation Note  Patient Details Name: [name] MRN: [id number] DOB: [birth year]   Cancelled Treatment:    Reason Eval/Treat Not Completed: Patient at procedure or test/unavailable.  In vasc procedure, retry at another time when pt is able to participate.   Ramond Dial 01/05/2023, 11:42 AM  Mee Hives, PT PhD Acute Rehab Dept. Number: [place] [facility] [place]

## 2023-01-05 NOTE — Progress Notes (Signed)
Site area: rt groin arterial sheath pulled by Murrell Converse, RN Site Prior to Removal:  Level 0 Pressure Applied For: 20 minutes Manual:   yes Patient Status During Pull:  stable Post Pull Site:  Level 0 Post Pull Instructions Given:  yes Post Pull Pulses Present: rt dp dopplered Dressing Applied:  gauze and tegaderm Bedrest begins @ 7253 Comments:

## 2023-01-05 NOTE — Interval H&P Note (Signed)
History and Physical Interval Note:  01/05/2023 10:56 AM  [name]  has presented today for surgery, with the diagnosis of PAD.  The various methods of treatment have been discussed with the patient and family. After consideration of risks, benefits and other options for treatment, the patient has consented to  Procedure(s): ABDOMINAL AORTOGRAM W/LOWER EXTREMITY (N/A) PERIPHERAL VASCULAR INTERVENTION as a surgical intervention.  The patient's history has been reviewed, patient examined, no change in status, stable for surgery.  I have reviewed the patient's chart and labs.  Questions were answered to the patient's satisfaction.     Cherre Robins

## 2023-01-05 NOTE — Progress Notes (Signed)
PROGRESS NOTE        PATIENT DETAILS Name: [name] Age: 82 y.o. Sex: female Date of Birth: 18-Apr-1941 Admit Date: 12/31/2022 Admitting Physician Evalee Mutton Kristeen Mans, MD QIO:[id number] [name], MD  Brief Summary: Patient is a 82 y.o.  female with medical history significant of PAD with critical limb ischemia (dry gangrene of left great toe), chronic HFpEF, PAF on Eliquis, CAD (managed medically-Plavix/statin) CKD stage IV, DM-2, Keppra, history of AML (in remission)-sent from PCPs office for evaluation of worsening lower extremity edema/hypoxemia-found to have acute on chronic HFpEF and left leg critical limb ischemia with dry gangrene of left great toe.   Significant events: 12/30-1/18>> hospitalization for left lower limb critical ischemia-s/p angiogram with balloon angioplasty-treated for UTI/hypokalemia/AKI/HFpEF exacerbation.  Discharged home with home health.   1/25>> sent to ED by PCP-for SOB/worsening leg swelling-HFpEF exacerbation, dry gangrene of left great toe.  Significant studies: 1/1>>1, 25 vitamin D:17.2 (low) 1/1>>25 vitamin D:19.58 (low) 1/3>>PTH 307 (high) 1/25>> CXR: Pulmonary vascular congestion, small bilateral pleural effusions 1/25>> lower extremity arterial duplex: 50-74% stenosis left superficial femoral artery, left popliteal artery and left posterior tibial artery  1/26.  ABIs. Duplex and ABIs reviewed appears to have restenosis of the left SFA and will need likely stenting on Monday.    Significant microbiology data: 1/24>> blood culture: No growth 1/25>> COVID/influenza/RSV PCR: Negative  Procedures: 1. Partial first ray amputation left by podiatrist Dr. Amalia Hailey on 01/03/2023  Consults: Vascular surgery Podiatry  Subjective: Patient in bed resting comfortably denies any headache chest or abdominal pain, no shortness of breath.  Left hip and leg pain much improved on present pain  regimen.  Objective: Vitals: Blood pressure (!) 132/52, pulse 81, temperature 97.6 F (36.4 C), temperature source Oral, resp. rate 16, height '5\' 2"'$  (1.575 m), weight 70.8 kg, SpO2 96 %.   Exam:  Awake Alert, No new F.N deficits, Normal affect [place].AT,PERRAL Supple Neck, No JVD,   Symmetrical Chest wall movement, Good air movement bilaterally, CTAB RRR,No Gallops, Rubs or new Murmurs,  +ve B.Sounds, Abd Soft, No tenderness,   Left foot under bandage, 1+ leg edema   Assessment/Plan:  Acute hypoxic respiratory failure 2 acute on chronic diastolic CHF EF 32%. Due to HFpEF exacerbation Remains on IV Lasix, monitor closely. On minimal amount of oxygen-titrate to room air over the next few days. Discussed with nursing staff regarding strict intake/output.   Follow daily weights/electrolytes   Critical limb ischemia-left toe gangrene - seen by both vascular surgery along with podiatry, S/p arteriogram on 1/17 with balloon angioplasty of ostial SFA lesion, proximal SFA lesion, Arterial Doppler on 1/25 Duplex and ABIs reviewed appears to have restenosis of the left SFA, per vascular surgery likely stenting on 01/05/2023, she underwent Partial first ray amputation left on 01/03/2023 by podiatrist Dr. Amalia Hailey.  Agreeable for blood transfusion if needed.   Chronic left hip pain with acute postop left foot pain.  Pain medications adjusted.  She is chronically on narcotics.  Hypocalcemia  Recent PTH levels appropriately elevated, vitamin D levels low Just started on calcitriol/Os-Cal yesterday-will increase calcitriol to 0.5 mcg today, change Os-Cal to 3 times daily dosing IV calcium given on 01/03/2023. Replace IV calcium on 01/04/2023  Hypomagnesemia Replaced   CKD stage IV, baseline creatinine around 2.2 At baseline Follow- electrolytes closely   Normocytic anemia Due to CKD No  evidence of blood loss Follow CBC closely   PAF Continue metoprolol Eliquis on hold-on IV heparin during her  perioperative period.    CAD Treated medically-continue Plavix/statin/beta-blocker No current anginal symptoms   HLD Statin   Seizure disorder Keppra   History of AML-in remission Follows with Dr. [name] as an outpatient  DM-2 (A1c 9.9 on 08/13/2022) Poor glycemic morning of 01/03/2023, oral intake quite spotty hold all insulin for now and monitor on just low-dose before every meal sliding scale.  Recent Labs    01/04/23 1636 01/04/23 2047 01/05/23 0623  GLUCAP 143* 261* 219*       Palliative care Very frail DNR in place Multiple medical issues as above-second hospitalization within a week from discharge At risk for further decompensation Have placed palliative care consultation for goals of care-suspect may benefit from establishment of home hospice on discharge.   BMI: Estimated body mass index is 28.55 kg/m as calculated from the following:   Height as of this encounter: '5\' 2"'$  (1.575 m).   Weight as of this encounter: 70.8 kg.   Code status:   Code Status: DNR   DVT Prophylaxis: IV heparin SCDs Start: 01/03/23 1525 Place TED hose Start: 01/03/23 0919 Family Communication: Daughter-Penny-[phone]-left voicemail 01/05/2023 at 9:50 AM   Disposition Plan: Status is: Inpatient Remains inpatient appropriate because: Severity of illness.   Planned Discharge Destination:Home   Diet: Diet Order             Diet NPO time specified Except for: Sips with Meds  Diet effective midnight                     Antimicrobial agents: Anti-infectives (From admission, onward)    None        MEDICATIONS: Scheduled Meds:  ascorbic acid  1,000 mg Oral Daily   atorvastatin  40 mg Oral Daily   calcitRIOL  0.5 mcg Oral Daily   calcium acetate  1,334 mg Oral TID WC   calcium carbonate  1,250 mg Oral TID WC   clopidogrel  75 mg Oral QPM   fluticasone furoate-vilanterol  1 puff Inhalation Daily   furosemide  80 mg Intravenous Daily   isosorbide  mononitrate  30 mg Oral Daily   levETIRAcetam  500 mg Oral BID   melatonin  3 mg Oral QHS   metoprolol succinate  12.5 mg Oral Daily   polyethylene glycol  17 g Oral Daily   Continuous Infusions:  sodium chloride 100 mL/hr at 01/05/23 0555   calcium gluconate 4 g (01/05/23 0937)   heparin 1,000 Units/hr (01/05/23 0500)   PRN Meds:.acetaminophen **OR** acetaminophen, albuterol, dextrose, hydrALAZINE, HYDROmorphone (DILAUDID) injection, LORazepam, methocarbamol, naLOXone (NARCAN)  injection, nitroGLYCERIN, ondansetron **OR** ondansetron (ZOFRAN) IV, oxyCODONE-acetaminophen   I have personally reviewed following labs and imaging studies  LABORATORY DATA:  Recent Labs  Lab 12/31/22 1805 01/02/23 0712 01/03/23 0205 01/04/23 0549 01/05/23 0031  WBC 5.8 7.5 7.2 10.8* 5.1  HGB 9.6* 8.2* 7.8* 9.4* 8.3*  HCT 30.8* 25.7* 23.9* 28.7* 25.7*  PLT 156 122* 110* 146* 92*  MCV 99.7 97.0 95.6 95.7 97.0  MCH 31.1 30.9 31.2 31.3 31.3  MCHC 31.2 31.9 32.6 32.8 32.3  RDW 17.2* 17.2* 17.1* 17.0* 16.8*  LYMPHSABS 0.8  --   --   --   --   MONOABS 0.4  --   --   --   --   EOSABS 0.0  --   --   --   --  BASOSABS 0.0  --   --   --   --     Recent Labs  Lab 12/31/22 1805 01/01/23 0205 01/01/23 0411 01/02/23 0556 01/03/23 0205 01/04/23 0549 01/05/23 0031  NA 140  --   --  141 140 139 138  K 4.0  --   --  3.9 3.9 3.9 5.1  CL 102  --   --  104 102 103 102  CO2 27  --   --  '28 29 25 26  '$ ANIONGAP 11  --   --  '9 9 11 10  '$ GLUCOSE 244*  --   --  335* 64* 38* 274*  BUN 85*  --   --  80* 83* 81* 75*  CREATININE 2.38*  --   --  2.09* 2.20* 2.12* 2.29*  AST  --   --   --   --  '26 28 21  '$ ALT  --   --   --   --  '14 14 11  '$ ALKPHOS  --   --   --   --  99 105 113  BILITOT  --   --   --   --  0.4 0.6 0.4  ALBUMIN  --   --   --  2.2* 2.0* 2.2* 2.1*  LATICACIDVEN 1.2 0.7  --   --   --   --   --   BNP  --   --  881.2*  --  742.1* 572.6* 680.6*  MG  --   --   --  1.5* 2.3 2.0 1.9  CALCIUM 6.2*  --   --   5.8* 6.4* 6.8* 7.3*    RADIOLOGY STUDIES/RESULTS: DG HIP UNILAT W OR W/O PELVIS MIN 4 VIEWS LEFT  Result Date: 01/04/2023 CLINICAL DATA:  Left hip pain.  Leg swelling. EXAM: DG HIP (WITH OR WITHOUT PELVIS) LEFT three views COMPARISON:  Left hip radiographs 12/10/2022 FINDINGS: Moderate osteopenia again noted. Vascular calcifications are present. Left hip is located. No acute or healing fractures are present. Right hip ORIF stable. IMPRESSION: 1. No acute or healing fracture. 2. Moderate osteopenia. 3. Vascular calcifications. Electronically Signed   By: San Morelle M.D.   On: 01/04/2023 11:26   DG Foot Complete Left  Result Date: 01/03/2023 CLINICAL DATA:  Postoperative. EXAM: LEFT FOOT - COMPLETE 3+ VIEW COMPARISON:  Preoperative radiograph 12/31/2022 FINDINGS: Transmetatarsal amputation of the first ray with resection of the first toe. Expected postsurgical change in the soft tissues. No evidence of acute fracture. Plantar calcaneal spur and Achilles tendon enthesophyte. IMPRESSION: Transmetatarsal amputation of the first ray with resection of the first toe. No immediate postoperative complication. Electronically Signed   By: Keith Rake M.D.   On: 01/03/2023 18:39     LOS: 4 days   Signature  -    Lala Lund M.D on 01/05/2023 at 9:50 AM   -  To page go to [email or web address]

## 2023-01-05 NOTE — Op Note (Addendum)
DATE OF SERVICE: 01/05/2023  PATIENT:  [name]  82 y.o. female  PRE-OPERATIVE DIAGNOSIS:  Atherosclerosis of native arteries of left lower extremity causing gangrene  POST-OPERATIVE DIAGNOSIS:  Same  PROCEDURE:   1) Ultrasound guided right access 2) Aortogram 3) Left lower extremity angiogram with third order cannulation (~38m contrast) 4) Left femoropopliteal angioplasty and stenting (6x1557m 6x15028m6x80m43muvia) 5) Conscious sedation (39 minutes)   SURGEON:  ThomYevonne AlinewkStanford Breed  ASSISTANT: none  ANESTHESIA:   local and IV sedation  ESTIMATED BLOOD LOSS: minimal  LOCAL MEDICATIONS USED:  LIDOCAINE   COUNTS: confirmed correct.  PATIENT DISPOSITION:  PACU - hemodynamically stable.   Delay start of Pharmacological VTE agent (>24hrs) due to surgical blood loss or risk of bleeding: no  INDICATION FOR PROCEDURE: [name] 82 y29. female with left great toe gangrene. After careful discussion of risks, benefits, and alternatives the patient was offered angiography with intervention. The patient understood and wished to proceed.  OPERATIVE FINDINGS:  Terminal aorta and iliac arteries: Widely patent  Left lower extremity: Common femoral artery: Diffuse disease. widely patent  Profunda femoris artery: Diffuse disease. widely patent  Superficial femoral artery: Diffuse disease. Restenosis of proximal, mid and distal SFA ~70% greatest area of stenosis.  Popliteal artery: Diffuse disease. Above knee popliteal with focal stenosis ~70%. No significant stenosis behind or below the knee. Anterior tibial artery: patent at its origin, tapers to occlusion in calf Tibioperoneal trunk: patent Peroneal artery: patent at its origin, tapers in mid calf Posterior tibial artery: dominant tibial, courses to plantar arch filling the foot Pedal circulation: fills via PT  GLASS score. FP 4. IP 0. Stage III.  WIfI score. 2 / 3 / 1. Stage IV.  DESCRIPTION OF PROCEDURE: After  identification of the patient in the pre-operative holding area, the patient was transferred to the operating room. The patient was positioned supine on the operating room table.  Anesthesia was induced. The groins was prepped and draped in standard fashion. A surgical pause was performed confirming correct patient, procedure, and operative location.  The right groin was anesthetized with subcutaneous injection of 1% lidocaine. Using ultrasound guidance, the right common femoral artery was accessed with micropuncture technique. Fluoroscopy was used to confirm cannulation over the femoral head. The 12F sheath was upsized to 43F.   A Benson wire was advanced into the distal aorta. Over the wire an omni flush catheter was advanced to the level of L2. Aortogram was performed - see above for details.   The left common iliac artery was selected with an omniflush catheter and glidewire advantage guidewire. The wire was advanced into the common femoral artery. Over the wire the omni flush catheter was advanced into the external iliac artery. Selective angiography was performed - see above for details.   The decision was made to intervene. The patient was heparinized with 8,000 units of heparin. The 43F sheath was exchanged for a 43F x 45cm sheath. Selective angiography of the left lower extremity was performed prior to intervention.   The lesions were treated with: Femoropopliteal angioplasty and stenting (6x150mm64m150mm,67m0mm E81ma)  Completion angiography revealed:  Resolution of SFA/Pop disease  The sheath was left in place to be removed in the recovery area.   Conscious sedation was administered with the use of IV fentanyl and midazolam under continuous physician and nurse monitoring.  Heart rate, blood pressure, and oxygen saturation were continuously monitored.  Total sedation time was 39 minutes  Upon  completion of the case instrument and sharps counts were confirmed correct. The patient was  transferred to the  PACU in good condition. I was present for all portions of the procedure.  PLAN: Plavix '75mg'$  PO QD. Eliquis 2.'5mg'$  PO BID. High intensity statin therapy. Optimized from vascular standpoint. Follow up in 4 weeks with ABI and Duplex.  Yevonne Aline. Stanford Breed, MD [facility] Phone Number: [phone] 01/05/2023 10:56 AM

## 2023-01-05 NOTE — Care Management Important Message (Signed)
Important Message  Patient Details  Name: [name] MRN: [id number] Date of Birth: [birth year]   Medicare Important Message Given:  Yes     [name] 01/05/2023, 10:13 AM

## 2023-01-05 NOTE — Progress Notes (Signed)
Orthopedic Tech Progress Note Patient Details:  [name] [birth year] [id number]  Ortho Devices Type of Ortho Device: Postop shoe/boot Ortho Device/Splint Location: LLE Ortho Device/Splint Interventions: Ordered, Application, Removal   Post Interventions Patient Tolerated: Well Instructions Provided: Care of device  Janit Pagan 01/05/2023, 9:45 AM

## 2023-01-05 NOTE — Plan of Care (Signed)
  Problem: Education: Goal: Understanding of CV disease, CV risk reduction, and recovery process will improve Outcome: Progressing Goal: Individualized Educational Video(s) Outcome: Progressing   Problem: Activity: Goal: Ability to return to baseline activity level will improve Outcome: Progressing   Problem: Cardiovascular: Goal: Ability to achieve and maintain adequate cardiovascular perfusion will improve Outcome: Progressing Goal: Vascular access site(s) Level 0-1 will be maintained Outcome: Progressing   Problem: Health Behavior/Discharge Planning: Goal: Ability to safely manage health-related needs after discharge will improve Outcome: Progressing   Problem: Education: Goal: Ability to demonstrate management of disease process will improve Outcome: Progressing Goal: Ability to verbalize understanding of medication therapies will improve Outcome: Progressing Goal: Individualized Educational Video(s) Outcome: Progressing   Problem: Activity: Goal: Capacity to carry out activities will improve Outcome: Progressing   Problem: Cardiac: Goal: Ability to achieve and maintain adequate cardiopulmonary perfusion will improve Outcome: Progressing   Problem: Education: Goal: Ability to describe self-care measures that may prevent or decrease complications (Diabetes Survival Skills Education) will improve Outcome: Progressing Goal: Individualized Educational Video(s) Outcome: Progressing   Problem: Coping: Goal: Ability to adjust to condition or change in health will improve Outcome: Progressing   Problem: Fluid Volume: Goal: Ability to maintain a balanced intake and output will improve Outcome: Progressing   Problem: Health Behavior/Discharge Planning: Goal: Ability to identify and utilize available resources and services will improve Outcome: Progressing Goal: Ability to manage health-related needs will improve Outcome: Progressing   Problem: Metabolic: Goal:  Ability to maintain appropriate glucose levels will improve Outcome: Progressing   Problem: Nutritional: Goal: Maintenance of adequate nutrition will improve Outcome: Progressing Goal: Progress toward achieving an optimal weight will improve Outcome: Progressing   Problem: Skin Integrity: Goal: Risk for impaired skin integrity will decrease Outcome: Progressing   Problem: Tissue Perfusion: Goal: Adequacy of tissue perfusion will improve Outcome: Progressing   Problem: Education: Goal: Knowledge of General Education information will improve Description: Including pain rating scale, medication(s)/side effects and non-pharmacologic comfort measures Outcome: Progressing   Problem: Health Behavior/Discharge Planning: Goal: Ability to manage health-related needs will improve Outcome: Progressing   Problem: Clinical Measurements: Goal: Ability to maintain clinical measurements within normal limits will improve Outcome: Progressing Goal: Will remain free from infection Outcome: Progressing Goal: Diagnostic test results will improve Outcome: Progressing Goal: Respiratory complications will improve Outcome: Progressing Goal: Cardiovascular complication will be avoided Outcome: Progressing   Problem: Activity: Goal: Risk for activity intolerance will decrease Outcome: Progressing   Problem: Nutrition: Goal: Adequate nutrition will be maintained Outcome: Progressing   Problem: Coping: Goal: Level of anxiety will decrease Outcome: Progressing   Problem: Elimination: Goal: Will not experience complications related to bowel motility Outcome: Progressing Goal: Will not experience complications related to urinary retention Outcome: Progressing   Problem: Pain Managment: Goal: General experience of comfort will improve Outcome: Progressing   Problem: Safety: Goal: Ability to remain free from injury will improve Outcome: Progressing   Problem: Skin Integrity: Goal: Risk  for impaired skin integrity will decrease Outcome: Progressing

## 2023-01-06 ENCOUNTER — Encounter (HOSPITAL_COMMUNITY): Payer: Self-pay | Admitting: Vascular Surgery

## 2023-01-06 DIAGNOSIS — I5033 Acute on chronic diastolic (congestive) heart failure: Secondary | ICD-10-CM | POA: Diagnosis not present

## 2023-01-06 LAB — COMPREHENSIVE METABOLIC PANEL
ALT: 11 U/L (ref 0–44)
AST: 24 U/L (ref 15–41)
Albumin: 1.8 g/dL — ABNORMAL LOW (ref 3.5–5.0)
Alkaline Phosphatase: 108 U/L (ref 38–126)
Anion gap: 9 (ref 5–15)
BUN: 69 mg/dL — ABNORMAL HIGH (ref 8–23)
CO2: 27 mmol/L (ref 22–32)
Calcium: 7.7 mg/dL — ABNORMAL LOW (ref 8.9–10.3)
Chloride: 104 mmol/L (ref 98–111)
Creatinine, Ser: 2.25 mg/dL — ABNORMAL HIGH (ref 0.44–1.00)
GFR, Estimated: 21 mL/min — ABNORMAL LOW (ref >60)
Glucose, Bld: 180 mg/dL — ABNORMAL HIGH (ref 70–99)
Potassium: 4.1 mmol/L (ref 3.5–5.1)
Sodium: 140 mmol/L (ref 135–145)
Total Bilirubin: 0.6 mg/dL (ref 0.3–1.2)
Total Protein: 4.5 g/dL — ABNORMAL LOW (ref 6.5–8.1)

## 2023-01-06 LAB — BRAIN NATRIURETIC PEPTIDE: B Natriuretic Peptide: 928.5 pg/mL — ABNORMAL HIGH (ref 0.0–100.0)

## 2023-01-06 LAB — GLUCOSE, CAPILLARY
Glucose-Capillary: 180 mg/dL — ABNORMAL HIGH (ref 70–99)
Glucose-Capillary: 135 mg/dL — ABNORMAL HIGH (ref 70–99)
Glucose-Capillary: 77 mg/dL (ref 70–99)
Glucose-Capillary: 92 mg/dL (ref 70–99)

## 2023-01-06 LAB — CBC
HCT: 24.4 % — ABNORMAL LOW (ref 36.0–46.0)
Hemoglobin: 7.6 g/dL — ABNORMAL LOW (ref 12.0–15.0)
MCH: 30.8 pg (ref 26.0–34.0)
MCHC: 31.1 g/dL (ref 30.0–36.0)
MCV: 98.8 fL (ref 80.0–100.0)
Platelets: 93 10*3/uL — ABNORMAL LOW (ref 150–400)
RBC: 2.47 MIL/uL — ABNORMAL LOW (ref 3.87–5.11)
RDW: 16.3 % — ABNORMAL HIGH (ref 11.5–15.5)
WBC: 4.6 10*3/uL (ref 4.0–10.5)
nRBC: 0 % (ref 0.0–0.2)

## 2023-01-06 LAB — CULTURE, BLOOD (ROUTINE X 2)
Culture: NO GROWTH
Special Requests: ADEQUATE

## 2023-01-06 LAB — LIPID PANEL
Cholesterol: 79 mg/dL (ref 0–200)
HDL: 43 mg/dL (ref >40)
LDL Cholesterol: 27 mg/dL (ref 0–99)
Total CHOL/HDL Ratio: 1.8 RATIO
Triglycerides: 46 mg/dL (ref <150)
VLDL: 9 mg/dL (ref 0–40)

## 2023-01-06 LAB — MAGNESIUM: Magnesium: 1.6 mg/dL — ABNORMAL LOW (ref 1.7–2.4)

## 2023-01-06 MED ORDER — APIXABAN 2.5 MG PO TABS
2.5000 mg | ORAL_TABLET | Freq: Two times a day (BID) | ORAL | Status: DC
  Administered 2023-01-06 – 2023-01-09 (×7): 2.5 mg via ORAL
  Filled 2023-01-06 (×7): qty 1

## 2023-01-06 MED ORDER — FERROUS SULFATE 325 (65 FE) MG PO TABS
325.0000 mg | ORAL_TABLET | Freq: Two times a day (BID) | ORAL | Status: DC
  Administered 2023-01-06 – 2023-01-09 (×6): 325 mg via ORAL
  Filled 2023-01-06 (×6): qty 1

## 2023-01-06 MED ORDER — MAGNESIUM SULFATE 4 GM/100ML IV SOLN
4.0000 g | Freq: Once | INTRAVENOUS | Status: AC
  Administered 2023-01-06: 4 g via INTRAVENOUS
  Filled 2023-01-06: qty 100

## 2023-01-06 MED ORDER — METOLAZONE 5 MG PO TABS
2.5000 mg | ORAL_TABLET | Freq: Once | ORAL | Status: AC
  Administered 2023-01-06: 2.5 mg via ORAL
  Filled 2023-01-06: qty 1

## 2023-01-06 MED ORDER — FUROSEMIDE 10 MG/ML IJ SOLN
60.0000 mg | Freq: Two times a day (BID) | INTRAMUSCULAR | Status: DC
  Administered 2023-01-06 – 2023-01-07 (×2): 60 mg via INTRAVENOUS
  Filled 2023-01-06 (×2): qty 6

## 2023-01-06 MED ORDER — INSULIN ASPART 100 UNIT/ML IJ SOLN
0.0000 [IU] | Freq: Three times a day (TID) | INTRAMUSCULAR | Status: DC
  Administered 2023-01-06: 3 [IU] via SUBCUTANEOUS
  Administered 2023-01-06: 2 [IU] via SUBCUTANEOUS
  Administered 2023-01-07 – 2023-01-08 (×3): 3 [IU] via SUBCUTANEOUS

## 2023-01-06 MED ORDER — INSULIN GLARGINE-YFGN 100 UNIT/ML ~~LOC~~ SOLN
12.0000 [IU] | Freq: Every day | SUBCUTANEOUS | Status: DC
  Administered 2023-01-06 – 2023-01-08 (×3): 12 [IU] via SUBCUTANEOUS
  Filled 2023-01-06 (×3): qty 0.12

## 2023-01-06 MED ORDER — FOLIC ACID 1 MG PO TABS
1.0000 mg | ORAL_TABLET | Freq: Every day | ORAL | Status: DC
  Administered 2023-01-06 – 2023-01-09 (×4): 1 mg via ORAL
  Filled 2023-01-06 (×4): qty 1

## 2023-01-06 NOTE — Progress Notes (Addendum)
PROGRESS NOTE        PATIENT DETAILS Name: [name] Age: 82 y.o. Sex: female Date of Birth: Mar 08, 1941 Admit Date: 12/31/2022 Admitting Physician Catherine Mutton Kristeen Mans, MD ZOX:[id number] Catherine Beals, MD  Brief Summary: Patient is a 82 y.o.  female with medical history significant of PAD with critical limb ischemia (dry gangrene of left great toe), chronic HFpEF, PAF on Eliquis, CAD (managed medically-Plavix/statin) CKD stage IV, DM-2, Keppra, history of AML (in remission)-sent from PCPs office for evaluation of worsening lower extremity edema/hypoxemia-found to have acute on chronic HFpEF and left leg critical limb ischemia with dry gangrene of left great toe.   Significant events: 12/30-1/18>> hospitalization for left lower limb critical ischemia-s/p angiogram with balloon angioplasty-treated for UTI/hypokalemia/AKI/HFpEF exacerbation.  Discharged home with home health.   1/25>> sent to ED by PCP-for SOB/worsening leg swelling-HFpEF exacerbation, dry gangrene of left great toe.  Significant studies: 1/1>>1, 25 vitamin D:17.2 (low) 1/1>>25 vitamin D:19.58 (low) 1/3>>PTH 307 (high) 1/25>> CXR: Pulmonary vascular congestion, small bilateral pleural effusions 1/25>> lower extremity arterial duplex: 50-74% stenosis left superficial femoral artery, left popliteal artery and left posterior tibial artery  1/26.  ABIs. Duplex and ABIs reviewed appears to have restenosis of the left SFA and will need likely stenting on Monday.    Significant microbiology data: 1/24>> blood culture: No growth 1/25>> COVID/influenza/RSV PCR: Negative  Procedures: 1. Partial first ray amputation left by podiatrist Catherine. Amalia [name] on 01/03/2023  01/05/23 - [name] [facility]  PROCEDURE:   1) Ultrasound guided right access 2) Aortogram 3) Left lower extremity angiogram with third order cannulation (~59m contrast) 4) Left femoropopliteal angioplasty and stenting (6x1580m 6x15064m6x80m62mluvia) 5) Conscious sedation (39 minutes)   Consults: Vascular surgery Podiatry  Subjective:  Patient in bed, appears comfortable, denies any headache, no fever, no chest pain or pressure, no shortness of breath , no abdominal pain. No new focal weakness.   Objective: Vitals: Blood pressure (!) 126/54, pulse 80, temperature 97.8 F (36.6 C), temperature source Oral, resp. rate 17, height '5\' 2"'$  (1.575 m), weight 74.9 kg, SpO2 95 %.   Exam:  Awake Alert, No new F.N deficits, Normal affect [name].AT,PERRAL Supple Neck, No JVD,   Symmetrical Chest wall movement, Good air movement bilaterally, CTAB RRR,No Gallops, Rubs or new Murmurs,  +ve B.Sounds, Abd Soft, No tenderness,   Left foot under bandage, 2+ leg edema   Assessment/Plan:  Acute hypoxic respiratory failure 2 acute on chronic diastolic CHF EF 60%.81%e to HFpEF exacerbation Remains on IV Lasix, monitor electrolytes closely. On minimal amount of oxygen-titrate to room air over the next few days. Discussed with nursing staff regarding strict intake/output.   Follow daily weights/electrolytes   Critical limb ischemia-left toe gangrene - seen by both vascular surgery along with podiatry, S/p arteriogram on 1/17 with balloon angioplasty of ostial SFA lesion, proximal SFA lesion, Arterial Doppler on 1/25 Duplex and ABIs reviewed appears to have restenosis of the left SFA, she underwent Partial first ray amputation left on 01/03/2023 by podiatrist Catherine. EvanAmalia [name] by left femoropopliteal angiogram and stenting by Catherine. HawkLuan Pullingcular surgeon on 01/05/2023.  Postprocedure recommendation is to continue the patient on Eliquis, Plavix and statin, agreeable for blood transfusion if needed.   Chronic left hip pain with acute postop left foot pain.  Pain medications adjusted.  She is chronically on narcotics.  Hypocalcemia  Recent PTH levels appropriately elevated, vitamin D levels low Just started on calcitriol/Os-Cal yesterday-will  increase calcitriol to 0.5 mcg today, change Os-Cal to 3 times daily dosing IV calcium given on 01/03/2023. Replace IV calcium on 01/04/2023  Hypomagnesemia Replaced   CKD stage IV, baseline creatinine around 2.2 At baseline Follow- electrolytes closely   Normocytic anemia Due to CKD No evidence of blood loss Follow CBC closely   PAF Continue metoprolol Eliquis on hold-on IV heparin during her perioperative period.    CAD Treated medically-continue Plavix/statin/beta-blocker No current anginal symptoms   HLD Statin   Seizure disorder Keppra   History of AML-in remission Follows with [name] as an outpatient  DM-2 (A1c 9.9 on 08/13/2022) Poor glycemic morning of 01/03/2023, oral intake quite spotty hold all insulin for now and monitor on just low-dose before every meal sliding scale.  Recent Labs    01/05/23 1827 01/05/23 2142 01/06/23 0803  GLUCAP 313* 338* 180*       Palliative care Very frail DNR in place Multiple medical issues as above-second hospitalization within a week from discharge At risk for further decompensation Have placed palliative care consultation for goals of care-suspect may benefit from establishment of home hospice on discharge.   BMI: Estimated body mass index is 30.2 kg/m as calculated from the following:   Height as of this encounter: '5\' 2"'$  (1.575 m).   Weight as of this encounter: 74.9 kg.   Code status:   Code Status: DNR   DVT Prophylaxis: IV heparin heparin injection 5,000 Units Start: 01/05/23 2200 SCD's Start: 01/05/23 1508 SCDs Start: 01/03/23 1525 Place TED hose Start: 01/03/23 0919 Family Communication: Daughter-[name]-[phone]-left voicemail 01/05/2023 at 9:50 AM   Disposition Plan: Status is: Inpatient Remains inpatient appropriate because: Severity of illness.   Planned Discharge Destination:Home   Diet: Diet Order             Diet renal/carb modified with fluid restriction Diet-HS Snack? Nothing;  Fluid restriction: 1200 mL Fluid; Room service appropriate? Yes; Fluid consistency: Thin  Diet effective now                     Antimicrobial agents: Anti-infectives (From admission, onward)    None        MEDICATIONS: Scheduled Meds:  ascorbic acid  1,000 mg Oral Daily   atorvastatin  40 mg Oral Daily   calcitRIOL  0.5 mcg Oral Daily   calcium acetate  1,334 mg Oral TID WC   calcium carbonate  1,250 mg Oral TID WC   clopidogrel  75 mg Oral QPM   ferrous sulfate  325 mg Oral BID WC   fluticasone furoate-vilanterol  1 puff Inhalation Daily   folic acid  1 mg Oral Daily   furosemide  60 mg Intravenous Q12H   heparin  5,000 Units Subcutaneous Q8H   insulin aspart  0-15 Units Subcutaneous TID WC   insulin glargine-yfgn  12 Units Subcutaneous Daily   isosorbide mononitrate  30 mg Oral Daily   levETIRAcetam  500 mg Oral BID   melatonin  3 mg Oral QHS   metoprolol succinate  12.5 mg Oral Daily   polyethylene glycol  17 g Oral Daily   Continuous Infusions:  sodium chloride     magnesium sulfate bolus IVPB     PRN Meds:.sodium chloride, acetaminophen **OR** acetaminophen, acetaminophen, albuterol, dextrose, hydrALAZINE, hydrALAZINE, HYDROmorphone (DILAUDID) injection, labetalol, LORazepam, methocarbamol, naLOXone (NARCAN)  injection, nitroGLYCERIN, ondansetron **OR** ondansetron (ZOFRAN) IV, ondansetron (ZOFRAN)  IV, oxyCODONE-acetaminophen, sodium chloride flush   I have personally reviewed following labs and imaging studies  LABORATORY DATA:  Recent Labs  Lab 12/31/22 1805 01/02/23 0712 01/03/23 0205 01/04/23 0549 01/05/23 0031 01/06/23 0635  WBC 5.8 7.5 7.2 10.8* 5.1 4.6  HGB 9.6* 8.2* 7.8* 9.4* 8.3* 7.6*  HCT 30.8* 25.7* 23.9* 28.7* 25.7* 24.4*  PLT 156 122* 110* 146* 92* 93*  MCV 99.7 97.0 95.6 95.7 97.0 98.8  MCH 31.1 30.9 31.2 31.3 31.3 30.8  MCHC 31.2 31.9 32.6 32.8 32.3 31.1  RDW 17.2* 17.2* 17.1* 17.0* 16.8* 16.3*  LYMPHSABS 0.8  --   --   --   --    --   MONOABS 0.4  --   --   --   --   --   EOSABS 0.0  --   --   --   --   --   BASOSABS 0.0  --   --   --   --   --     Recent Labs  Lab 12/31/22 1805 01/01/23 0205 01/01/23 0411 01/02/23 0556 01/03/23 0205 01/04/23 0549 01/05/23 0031 01/06/23 0635  NA 140  --   --  141 140 139 138 140  K 4.0  --   --  3.9 3.9 3.9 5.1 4.1  CL 102  --   --  104 102 103 102 104  CO2 27  --   --  '28 29 25 26 27  '$ ANIONGAP 11  --   --  '9 9 11 10 9  '$ GLUCOSE 244*  --   --  335* 64* 38* 274* 180*  BUN 85*  --   --  80* 83* 81* 75* 69*  CREATININE 2.38*  --   --  2.09* 2.20* 2.12* 2.29* 2.25*  AST  --   --   --   --  '26 28 21 24  '$ ALT  --   --   --   --  '14 14 11 11  '$ ALKPHOS  --   --   --   --  99 105 113 108  BILITOT  --   --   --   --  0.4 0.6 0.4 0.6  ALBUMIN  --   --   --  2.2* 2.0* 2.2* 2.1* 1.8*  LATICACIDVEN 1.2 0.7  --   --   --   --   --   --   BNP  --   --  881.2*  --  742.1* 572.6* 680.6* 928.5*  MG  --   --   --  1.5* 2.3 2.0 1.9 1.6*  CALCIUM 6.2*  --   --  5.8* 6.4* 6.8* 7.3* 7.7*    RADIOLOGY STUDIES/RESULTS: PERIPHERAL VASCULAR CATHETERIZATION  Result Date: 01/05/2023 DATE OF SERVICE: 01/05/2023  PATIENT:  [name]  82 y.o. female  PRE-OPERATIVE DIAGNOSIS:  Atherosclerosis of native arteries of left lower extremity causing gangrene  POST-OPERATIVE DIAGNOSIS:  Same  PROCEDURE:  1) Ultrasound guided right access 2) Aortogram 3) Left lower extremity angiogram with third order cannulation (~46m contrast) 4) Left femoropopliteal angioplasty and stenting (6x1575m 6x15075m6x80m45muvia) 5) Conscious sedation (39 minutes)   SURGEON:  ThomYevonne AlinewkStanford Breed  ASSISTANT: none  ANESTHESIA:   local and IV sedation  ESTIMATED BLOOD LOSS: minimal  LOCAL MEDICATIONS USED:  LIDOCAINE  COUNTS: confirmed correct.  PATIENT DISPOSITION:  PACU - hemodynamically stable.  Delay start of Pharmacological VTE agent (>24hrs) due to surgical blood loss  or risk of bleeding: no  INDICATION FOR PROCEDURE: SIERRA SPARGO is a 82 y.o. female with left great toe gangrene. After careful discussion of risks, benefits, and alternatives the patient was offered angiography with intervention. The patient understood and wished to proceed.  OPERATIVE FINDINGS: Terminal aorta and iliac arteries: Widely patent  Left lower extremity: Common femoral artery: Diffuse disease. widely patent Profunda femoris artery: Diffuse disease. widely patent Superficial femoral artery: Diffuse disease. Restenosis of proximal, mid and distal SFA ~70% greatest area of stenosis. Popliteal artery: Diffuse disease. Above knee popliteal with focal stenosis ~70%. No significant stenosis behind or below the knee. Anterior tibial artery: patent at its origin, tapers to occlusion in calf Tibioperoneal trunk: patent Peroneal artery: patent at its origin, tapers in mid calf Posterior tibial artery: dominant tibial, courses to plantar arch filling the foot Pedal circulation: fills via PT  GLASS score. FP 4. IP 0. Stage III.  WIfI score. 2 / 3 / 1. Stage IV.  DESCRIPTION OF PROCEDURE: After identification of the patient in the pre-operative holding area, the patient was transferred to the operating room. The patient was positioned supine on the operating room table.  Anesthesia was induced. The groins was prepped and draped in standard fashion. A surgical pause was performed confirming correct patient, procedure, and operative location.  The right groin was anesthetized with subcutaneous injection of 1% lidocaine. Using ultrasound guidance, the right common femoral artery was accessed with micropuncture technique. Fluoroscopy was used to confirm cannulation over the femoral head. The 68F sheath was upsized to 59F.  A Benson wire was advanced into the distal aorta. Over the wire an omni flush catheter was advanced to the level of L2. Aortogram was performed - see above for details.  The left common iliac artery was selected with an omniflush catheter and glidewire  advantage guidewire. The wire was advanced into the common femoral artery. Over the wire the omni flush catheter was advanced into the external iliac artery. Selective angiography was performed - see above for details.  The decision was made to intervene. The patient was heparinized with 8,000 units of heparin. The 59F sheath was exchanged for a 57F x 45cm sheath. Selective angiography of the left lower extremity was performed prior to intervention.  The lesions were treated with: Femoropopliteal angioplasty and stenting (6x14m, 6x1514m 6x8082mluvia)  Completion angiography revealed: Resolution of SFA/Pop disease  The sheath was left in place to be removed in the recovery area.  Conscious sedation was administered with the use of IV fentanyl and midazolam under continuous physician and nurse monitoring.  Heart rate, blood pressure, and oxygen saturation were continuously monitored.  Total sedation time was 39 minutes  Upon completion of the case instrument and sharps counts were confirmed correct. The patient was transferred to the  PACU in good condition. I was present for all portions of the procedure.  PLAN: Plavix '75mg'$  PO QD. Eliquis 2.'5mg'$  PO BID. High intensity statin therapy. Optimized from vascular standpoint. Follow up in 4 weeks with ABI and Duplex.  ThoYevonne AlineawStanford BreedD [facility] Number: [phone]/2024 10:56 AM    DG HIP UNILAT W OR W/O PELVIS MIN 4 VIEWS LEFT  Result Date: 01/04/2023 CLINICAL DATA:  Left hip pain.  Leg swelling. EXAM: DG HIP (WITH OR WITHOUT PELVIS) LEFT three views COMPARISON:  Left hip radiographs 12/10/2022 FINDINGS: Moderate osteopenia again noted. Vascular calcifications are present. Left hip is located. No acute or  healing fractures are present. Right hip ORIF stable. IMPRESSION: 1. No acute or healing fracture. 2. Moderate osteopenia. 3. Vascular calcifications. Electronically Signed   By: San Morelle M.D.    On: 01/04/2023 11:26     LOS: 5 days   Signature  -    Lala Lund M.D on 01/06/2023 at 10:07 AM   -  To page go to [email or web address]

## 2023-01-06 NOTE — Progress Notes (Signed)
Mobility Specialist - Progress Note   01/06/23 1455  Mobility  Activity Dangled on edge of bed  Level of Assistance Moderate assist, patient does 50-74%  Range of Motion/Exercises All extremities  Activity Response Tolerated poorly  Mobility Referral Yes  $Mobility charge 1 Mobility   Pt was received in bed and agreeable to session. Upon getting EOB pt expressed pain in LLE and requested to lay back down. Pt was then agreeable to bed level exercise. Pt was left in bed with all needs met and bed alarm on.   Franki Monte  Mobility Specialist Please contact via Solicitor or Rehab office at [phone]

## 2023-01-06 NOTE — Progress Notes (Addendum)
[facility] [place]  Subjective  - Comfortable, no new complaints   Objective (!) 137/51 86 97.6 F (36.4 C) (Oral) 16 95%  Intake/Output Summary (Last 24 hours) at 01/06/2023 0714 Last data filed at 01/06/2023 0617 Gross per 24 hour  Intake 1504.12 ml  Output 1300 ml  Net 204.12 ml    Right groin soft without hematoma Left doppler PT> peroneal signal   Assessment/Planning: PAD with left history of left GT gangrene POD # 1 angiogram with Left femoropopliteal angioplasty and stenting (6x116m, 6x1516m 6x8028mluvia)   Improved inflow with doppler signals PT.peroneal left LE  PLAN per DR. Nareh Matzke's post op note.: Plavix '75mg'$  PO QD. Eliquis 2.'5mg'$  PO BID. High intensity statin therapy. Optimized from vascular standpoint. Follow up in 4 weeks with ABI and Duplex.   F/U will be arranged call VVS with concerns   EmmRoxy Horseman30/2024 7:14 AM --  Laboratory Lab Results: Recent Labs    01/04/23 0549 01/05/23 0031  WBC 10.8* 5.1  HGB 9.4* 8.3*  HCT 28.7* 25.7*  PLT 146* 92*   BMET Recent Labs    01/04/23 0549 01/05/23 0031  NA 139 138  K 3.9 5.1  CL 103 102  CO2 25 26  GLUCOSE 38* 274*  BUN 81* 75*  CREATININE 2.12* 2.29*  CALCIUM 6.8* 7.3*    COAG Lab Results  Component Value Date   INR 1.1 12/08/2021   INR 1.1 12/05/2021   INR 1.02 11/21/2017   PROTIME 13.2 02/29/2016   No results found for: "PTT"  VASCULAR STAFF ADDENDUM: I have independently interviewed and examined the patient. I agree with the above.  Good technical result from angioplasty + stent of L femoropopliteal arteries. Recommend Plavix / Eliquis / high intensity statin therapy going forward. Follow up with me in 1 month with ABI / LLE duplex Please call for questions.  ThoYevonne AlineawStanford BreedD [facility]AC[facility] Number: ([phone]/2024 8:47 AM

## 2023-01-06 NOTE — TOC Initial Note (Signed)
Transition of Care [facility]) - Initial/Assessment Note    Patient Details  Name: [name] MRN: [id number] Date of Birth: 1941-09-30  [facility]) CM/SW Contact:    Milas Gain, [place] Phone Number: 01/06/2023, 3:33 PM  Clinical Narrative:                  CSW received consult for possible SNF placement at time of discharge. CSW spoke with patient regarding PT recommendation of SNF placement at time of discharge. Patient request for CSW to speak with her spouse regarding her dc plan. CSW spoke with patients spouse Gwyndolyn Saxon regarding PT recommendation for SNF placement for patient at time of dc. Patients spouse reports that PTA patient comes from home with him.Patients spouse expressed understanding of PT recommendation and is agreeable to SNF placement for patient at time of discharge. Patients spouse gave CSW permission to fax out initial referral near the [facility] area for possible SNF placement for patient. CSW discussed insurance authorization process and will provide Medicare SNF ratings list with accepted SNF bed offers when available.   No further questions reported at this time. CSW to continue to follow and assist with discharge planning needs.    Expected Discharge Plan: Skilled Nursing Facility Barriers to Discharge: Continued Medical Work up   Patient Goals and CMS Choice   CMS Medicare.gov Compare Post Acute Care list provided to:: Patient Represenative (must comment) (Patients spouse) Choice offered to / list presented to : Spouse      Expected Discharge Plan and Services In-house Referral: Clinical Social Work     Living arrangements for the past 2 months: Single Family Home                                      Prior Living Arrangements/Services Living arrangements for the past 2 months: Single Family Home Lives with:: Spouse Patient language and need for interpreter reviewed:: Yes Do you feel safe going back to the place where you live?:  No   SNF  Need for Family Participation in Patient Care: Yes (Comment) Care giver support system in place?: Yes (comment)   Criminal Activity/Legal Involvement Pertinent to Current Situation/Hospitalization: No - Comment as needed  Activities of Daily Living      Permission Sought/Granted Permission sought to share information with : Case Manager, Family Supports, Customer service manager Permission granted to share information with : Yes, Verbal Permission Granted  Share Information with NAME: Gwyndolyn Saxon  Permission granted to share info w AGENCY: SNF  Permission granted to share info w Relationship: Spouse  Permission granted to share info w Contact Information: Gwyndolyn Saxon [phone]  Emotional Assessment   Attitude/Demeanor/Rapport: Gracious Affect (typically observed): Calm Orientation: : Oriented to Self, Oriented to Place, Oriented to  Time, Oriented to Situation Alcohol / Substance Use: Not Applicable Psych Involvement: No (comment)  Admission diagnosis:  PVD (peripheral vascular disease) ([place]) [I73.9] CHF exacerbation (HCC) [I50.9] Acute on chronic diastolic CHF (congestive heart failure) (HCC) [I50.33] Acute on chronic respiratory failure with hypoxia (HCC) [J96.21] Gangrene of toe of left foot ([place]) [I96] Acute on chronic congestive heart failure, unspecified heart failure type [facility]) [I50.9] Patient Active Problem List   Diagnosis Date Noted   CHF exacerbation ([place]) 01/01/2023   Acute cystitis 12/07/2022   Generalized weakness 12/07/2022   Hypocalcemia 12/07/2022   History of seizures 12/07/2022   COPD (chronic obstructive pulmonary disease) ([place]) 10/25/2022  Persistent atrial fibrillation ([place]) 10/22/2022   Acute on chronic diastolic (congestive) heart failure ([place]) 10/16/2022   Chronic pain disorder 10/16/2022   Intertrochanteric fracture of right hip ([place]) 09/05/2022   Acute urinary retention 09/01/2022   Hip fracture ([place]) 08/29/2022   Paroxysmal atrial  fibrillation (HCC)    Abdominal pain 04/17/2022   Pulmonary nodule 04/17/2022   DNR (do not resuscitate) 04/17/2022   AKI (acute kidney injury) ([place]) 12/06/2021   Hyperglycemia 12/06/2021   Seizure ([place]) 12/05/2021   Acute on chronic diastolic CHF (congestive heart failure) ([place]) 05/30/2020   Genetic testing 12/29/2019   Family history of esophageal cancer    Family history of lung cancer    Family history of cervical cancer    Family history of skin cancer    Family history of leukemia    History of uterine cancer    Carcinoma of upper-outer quadrant of left breast in female, estrogen receptor positive ([place]) 10/17/2019   Type 2 diabetes mellitus with hyperlipidemia ([place]) 05/04/2019   DM2 (diabetes mellitus, type 2) ([place]) 05/04/2019   Diabetes mellitus ([place]) 05/04/2019   Morbid (severe) obesity due to excess calories ([place]) 10/27/2018   Localization-related (focal) (partial) symptomatic epilepsy and epileptic syndromes with complex partial seizures, not intractable, without status epilepticus ([place]) 10/27/2018   Expressive aphasia 11/21/2017   Hyponatremia 11/21/2017   TIA (transient ischemic attack)    Chest pain 09/24/2017   History of non-ST elevation myocardial infarction (NSTEMI)    Acute on chronic combined systolic and diastolic heart failure ([place])    Vitamin B12 deficiency 10/29/2016   Port catheter in place 05/30/2016   Other pancytopenia ([place]) 08/09/2014   Leukemia, acute ([place]) 06/08/2014   History of TIA (transient ischemic attack) 05/24/2014   CKD (chronic kidney disease) stage 4, GFR 15-29 ml/min (HCC) 05/24/2014   AML (acute myeloblastic leukemia) ([place]) 05/24/2014   Acute leukemia ([place]) 03/02/2014   Cervical spine pain 02/24/2014   Alkaline phosphatase elevation 02/24/2014   Anemia of chronic disease 02/24/2014   Chronic back pain 02/13/2014   PAF- flutter 02/04/2014   Pancreatitis 01/29/2014   Lesion of pancreas 01/29/2014   HIP PAIN, LEFT 01/26/2008    Hyperlipidemia 12/24/2007   CAD (coronary artery disease) 12/24/2007   CHEST PAIN, ATYPICAL 12/24/2007   NEPHROLITHIASIS, HX OF 12/24/2007   Insulin dependent diabetes mellitus 07/13/2007   Essential hypertension 07/13/2007   PCP:  Isaac Bliss, Rayford Halsted, MD Pharmacy:   [facility] [phone] - [place], [place] - [address] AT [place] [address] [place] [address] Phone: [phone] Fax: [phone]  [facility] (New Address) - [place], [place] - [address] AT Previously: [address] [place] [address] [place] [address] Phone: [phone] Fax: [phone]  [facility] - [place], [place] [address] Phone: [phone] Fax: [phone]     Social Determinants of Health (SDOH) Social History: SDOH Screenings   Food Insecurity: No Food Insecurity (12/29/2022)  Housing: Low Risk  (12/08/2022)  Transportation Needs: No Transportation Needs (12/29/2022)  Utilities: Not At Risk (12/08/2022)  Alcohol Screen: Low Risk  (10/22/2022)  Depression (PHQ2-9): Low Risk  (10/03/2022)  Financial Resource Strain: Low Risk  (10/22/2022)  Stress: No Stress Concern Present (05/12/2022)  Tobacco Use: Medium Risk (01/06/2023)   SDOH Interventions:     Readmission Risk Interventions    12/09/2022  4:35 PM  Readmission Risk Prevention Plan  Transportation Screening Complete  Medication Review Press photographer) Complete  HRI or Home Care Consult Complete  SW Recovery Care/Counseling Consult Complete  Palliative Care Screening Not [place] Not Applicable

## 2023-01-06 NOTE — Evaluation (Signed)
Physical Therapy Evaluation Patient Details Name: [name] MRN: [id number] DOB: 12/13/1940 Today's Date: 01/06/2023  History of Present Illness  Pt is an 82 y/o F presenting to ED on 1/24 from PCP for evaluation of worsening LLE edema, found to have acute on chronic HFpEF and LLE ischemia with dry gangrene of L great toe. S/p L first partial ray amputation on 1/27. PMH includes PAD with critical limb ischemia, chronic HFpEF, PAF on Eliquis, CAD, CKD IV, DM2 now s/p angiogram with Left femoropopliteal angioplasty and stenting 1/29.   Clinical Impression  Patient received in bed, she is lethargic, but agreeable to PT assessment. Patient requires mid A for supine to sit with heavy use of bed rail. She is able to sit without external support but has immediate severe pain in left foot with sitting. Patient is unable to attempt standing at this time due to pain. Pain medicine requested from RN. Patient will continue to benefit from skilled PT to improve functional independence, safety and strength.  I am hopeful that when pain is better managed she will progress well with mobility.         Recommendations for follow up therapy are one component of a multi-disciplinary discharge planning process, led by the attending physician.  Recommendations may be updated based on patient status, additional functional criteria and insurance authorization.  Follow Up Recommendations Skilled nursing-short term rehab (<3 hours/day) Can patient physically be transported by private vehicle: No    Assistance Recommended at Discharge Frequent or constant Supervision/Assistance  Patient can return home with the following  A lot of help with walking and/or transfers;A lot of help with bathing/dressing/bathroom;Help with stairs or ramp for entrance;Assist for transportation    Equipment Recommendations None recommended by PT  Recommendations for Other Services       Functional Status Assessment Patient has had a  recent decline in their functional status and demonstrates the ability to make significant improvements in function in a reasonable and predictable amount of time.     Precautions / Restrictions Precautions Precautions: Fall Restrictions Weight Bearing Restrictions: Yes LLE Weight Bearing: Partial weight bearing LLE Partial Weight Bearing Percentage or Pounds: Heel pressure only      Mobility  Bed Mobility Overal bed mobility: Needs Assistance Bed Mobility: Supine to Sit, Sit to Supine     Supine to sit: Min assist Sit to supine: Min assist   General bed mobility comments: assist to raise trunk to seated positiong, and to bring BLE's into bed    Transfers                   General transfer comment: unable to attempt at this time due to severe pain in sitting edge of bed    Ambulation/Gait               General Gait Details: unable to attempt  Stairs            Wheelchair Mobility    Modified Rankin (Stroke Patients Only)       Balance Overall balance assessment: Needs assistance Sitting-balance support: Feet supported Sitting balance-Leahy Scale: Fair                                       Pertinent Vitals/Pain Pain Assessment Pain Assessment: 0-10 Pain Score: 10-Worst pain ever Pain Location: LLE when in dependent position ( hanging off side of bed) Pain  Descriptors / Indicators: Discomfort, Grimacing, Guarding, Moaning Pain Intervention(s): Monitored during session, Repositioned, Patient requesting pain meds-RN notified    Home Living Family/patient expects to be discharged to:: Private residence Living Arrangements: Spouse/significant other Available Help at Discharge: Family;Available 24 hours/day Type of Home: House Home Access: Stairs to enter Entrance Stairs-Rails: Can reach both Entrance Stairs-Number of Steps: 1  step   Home Layout: One level Home Equipment: Conservation officer, nature (2 wheels);Tub bench;Wheelchair -  manual Additional Comments: does not wear O2 at home    Prior Function Prior Level of Function : Independent/Modified Independent;Working/employed;Driving             Mobility Comments: uses RW ADLs Comments: reports ind, however previous admission needed assist for LB dressing     Hand Dominance   Dominant Hand: Right    Extremity/Trunk Assessment   Upper Extremity Assessment Upper Extremity Assessment: Defer to OT evaluation    Lower Extremity Assessment Lower Extremity Assessment: LLE deficits/detail LLE: Unable to fully assess due to pain       Communication   Communication: No difficulties  Cognition Arousal/Alertness: Awake/alert Behavior During Therapy: WFL for tasks assessed/performed, Anxious Overall Cognitive Status: No family/caregiver present to determine baseline cognitive functioning                                          General Comments      Exercises     Assessment/Plan    PT Assessment Patient needs continued PT services  PT Problem List Decreased strength;Decreased activity tolerance;Decreased balance;Decreased mobility;Decreased skin integrity;Pain       PT Treatment Interventions DME instruction;Gait training;Stair training;Functional mobility training;Therapeutic activities;Therapeutic exercise;Balance training;Patient/family education    PT Goals (Current goals can be found in the Care Plan section)  Acute Rehab PT Goals Patient Stated Goal: home PT Goal Formulation: With patient Time For Goal Achievement: 01/20/23 Potential to Achieve Goals: Fair    Frequency Min 3X/week     Co-evaluation               AM-PAC PT "6 Clicks" Mobility  Outcome Measure Help needed turning from your back to your side while in a flat bed without using bedrails?: None Help needed moving from lying on your back to sitting on the side of a flat bed without using bedrails?: A Lot Help needed moving to and from a bed to a  chair (including a wheelchair)?: A Lot Help needed standing up from a chair using your arms (e.g., wheelchair or bedside chair)?: Total Help needed to walk in hospital room?: Total Help needed climbing 3-5 steps with a railing? : Total 6 Click Score: 11    End of Session   Activity Tolerance: Patient limited by pain Patient left: in bed;with bed alarm set;with call bell/phone within reach Nurse Communication: Mobility status PT Visit Diagnosis: Other abnormalities of gait and mobility (R26.89);Pain Pain - Right/Left: Left Pain - part of body: Ankle and joints of foot    Time: 1100-1115 PT Time Calculation (min) (ACUTE ONLY): 15 min   Charges:   PT Evaluation $PT Eval Moderate Complexity: 1 Mod          Elic Vencill, PT, GCS 01/06/23,11:24 AM

## 2023-01-06 NOTE — NC FL2 (Signed)
[place] LEVEL OF CARE FORM     IDENTIFICATION  Patient Name: [name] Birthdate: 11/13/1941 Sex: female Admission Date (Current Location): 12/31/2022  [facility] and [place] Number:  Herbalist and Address:  [facility] [place] [address], [place], [place] [address]      Provider Number: [id number]  Attending Physician Name and Address:  Thurnell Lose, MD  Relative Name and Phone Number:  Gwyndolyn Saxon (Spouse) [phone]    Current Level of Care: Hospital Recommended Level of Care: [place] Prior Approval Number:    Date Approved/Denied:   PASRR Number: [id number]  Discharge Plan: SNF    Current Diagnoses: Patient Active Problem List   Diagnosis Date Noted   CHF exacerbation ([place]) 01/01/2023   Acute cystitis 12/07/2022   Generalized weakness 12/07/2022   Hypocalcemia 12/07/2022   History of seizures 12/07/2022   COPD (chronic obstructive pulmonary disease) ([place]) 10/25/2022   Persistent atrial fibrillation ([place]) 10/22/2022   Acute on chronic diastolic (congestive) heart failure ([place]) 10/16/2022   Chronic pain disorder 10/16/2022   Intertrochanteric fracture of right hip ([place]) 09/05/2022   Acute urinary retention 09/01/2022   Hip fracture ([place]) 08/29/2022   Paroxysmal atrial fibrillation ([place])    Abdominal pain 04/17/2022   Pulmonary nodule 04/17/2022   DNR (do not resuscitate) 04/17/2022   AKI (acute kidney injury) ([place]) 12/06/2021   Hyperglycemia 12/06/2021   Seizure ([place]) 12/05/2021   Acute on chronic diastolic CHF (congestive heart failure) ([place]) 05/30/2020   Genetic testing 12/29/2019   Family history of esophageal cancer    Family history of lung cancer    Family history of cervical cancer    Family history of skin cancer    Family history of leukemia    History of uterine cancer    Carcinoma of upper-outer quadrant of left breast in female, estrogen receptor positive ([place]) 10/17/2019    Type 2 diabetes mellitus with hyperlipidemia ([place]) 05/04/2019   DM2 (diabetes mellitus, type 2) ([place]) 05/04/2019   Diabetes mellitus ([place]) 05/04/2019   Morbid (severe) obesity due to excess calories ([place]) 10/27/2018   Localization-related (focal) (partial) symptomatic epilepsy and epileptic syndromes with complex partial seizures, not intractable, without status epilepticus ([place]) 10/27/2018   Expressive aphasia 11/21/2017   Hyponatremia 11/21/2017   TIA (transient ischemic attack)    Chest pain 09/24/2017   History of non-ST elevation myocardial infarction (NSTEMI)    Acute on chronic combined systolic and diastolic heart failure ([place])    Vitamin B12 deficiency 10/29/2016   Port catheter in place 05/30/2016   Other pancytopenia ([place]) 08/09/2014   Leukemia, acute ([place]) 06/08/2014   History of TIA (transient ischemic attack) 05/24/2014   CKD (chronic kidney disease) stage 4, GFR 15-29 ml/min (HCC) 05/24/2014   AML (acute myeloblastic leukemia) ([place]) 05/24/2014   Acute leukemia ([place]) 03/02/2014   Cervical spine pain 02/24/2014   Alkaline phosphatase elevation 02/24/2014   Anemia of chronic disease 02/24/2014   Chronic back pain 02/13/2014   PAF- flutter 02/04/2014   Pancreatitis 01/29/2014   Lesion of pancreas 01/29/2014   HIP PAIN, LEFT 01/26/2008   Hyperlipidemia 12/24/2007   CAD (coronary artery disease) 12/24/2007   CHEST PAIN, ATYPICAL 12/24/2007   NEPHROLITHIASIS, HX OF 12/24/2007   Insulin dependent diabetes mellitus 07/13/2007   Essential hypertension 07/13/2007    Orientation RESPIRATION BLADDER Height & Weight     Self, Time, Situation, Place  Normal Continent, External catheter (External Urinary Catheter) Weight: 165  lb 2 oz (74.9 kg) Height:  '5\' 2"'$  (157.5 cm)  BEHAVIORAL SYMPTOMS/MOOD NEUROLOGICAL BOWEL NUTRITION STATUS      Continent Diet (Please see discharge summary)  AMBULATORY STATUS COMMUNICATION OF NEEDS Skin   Extensive Assist Verbally Other (Comment)  (Appropriate for ethnicity,dry,Ecchymosis,buttocks,arm,L,Erythema,abdomen,R,Wound/Incision LDAs,Incision closed,foot,L,Wound/Incision open or dehiced Irritant dermatitis (MASD),abdomen,anterior,lower,MASD/yeast to abdominal fold,see additional info)                       Personal Care Assistance Level of Assistance  Bathing, Feeding, Dressing Bathing Assistance: Maximum assistance Feeding assistance: Independent Dressing Assistance: Maximum assistance     Functional Limitations Info  Sight, Hearing, Speech Sight Info: Impaired Hearing Info: Adequate Speech Info: Adequate    SPECIAL CARE FACTORS FREQUENCY  PT (By licensed PT), OT (By licensed OT)     PT Frequency: 5x min weekly OT Frequency: 5x min weekly            Contractures Contractures Info: Not present    Additional Factors Info  Allergies, Code Status, Insulin Sliding Scale, Psychotropic Code Status Info: DNR Allergies Info: Chlorhexidine Gluconate Psychotropic Info: levETIRAcetam (KEPPRA) tablet 500 mg 2 times daily Insulin Sliding Scale Info: insulin aspart (novoLOG) injection 0-15 Units 3 times daily with meals,insulin glargine-yfgn (SEMGLEE) injection 12 Units daily       Current Medications (01/06/2023):  This is the current hospital active medication list Current Facility-Administered Medications  Medication Dose Route Frequency Provider Last Rate Last Admin   0.9 %  sodium chloride infusion  250 mL Intravenous PRN Cherre Robins, MD       acetaminophen (TYLENOL) tablet 650 mg  650 mg Oral Q6H PRN Jonetta Osgood, MD   650 mg at 01/04/23 1660   Or   acetaminophen (TYLENOL) suppository 650 mg  650 mg Rectal Q6H PRN Jonetta Osgood, MD       acetaminophen (TYLENOL) tablet 650 mg  650 mg Oral Q4H PRN Cherre Robins, MD   650 mg at 01/05/23 1523   albuterol (PROVENTIL) (2.5 MG/3ML) 0.083% nebulizer solution 2.5 mg  2.5 mg Nebulization Q2H PRN Jonetta Osgood, MD       apixaban Arne [place]) tablet  2.5 mg  2.5 mg Oral BID Thurnell Lose, MD   2.5 mg at 01/06/23 1154   ascorbic acid (VITAMIN C) tablet 1,000 mg  1,000 mg Oral Daily Jonetta Osgood, MD   1,000 mg at 01/06/23 6301   atorvastatin (LIPITOR) tablet 40 mg  40 mg Oral Daily Jonetta Osgood, MD   40 mg at 01/06/23 0815   calcitRIOL (ROCALTROL) capsule 0.5 mcg  0.5 mcg Oral Daily Jonetta Osgood, MD   0.5 mcg at 01/06/23 0814   calcium acetate (PHOSLO) capsule 1,334 mg  1,334 mg Oral TID WC Jonetta Osgood, MD   1,334 mg at 01/06/23 1154   calcium carbonate (OS-CAL - dosed in mg of elemental calcium) tablet 1,250 mg  1,250 mg Oral TID WC Jonetta Osgood, MD   1,250 mg at 01/06/23 1041   clopidogrel (PLAVIX) tablet 75 mg  75 mg Oral QPM Ghimire, Henreitta Leber, MD   75 mg at 01/05/23 1713   dextrose 50 % solution 50 mL  1 ampule Intravenous Q1H PRN Howerter, Justin B, DO   50 mL at 01/04/23 0641   ferrous sulfate tablet 325 mg  325 mg Oral BID WC Thurnell Lose, MD       fluticasone furoate-vilanterol (BREO ELLIPTA) 200-25 MCG/ACT  1 puff  1 puff Inhalation Daily Jonetta Osgood, MD   1 puff at 76/54/65 0354   folic acid (FOLVITE) tablet 1 mg  1 mg Oral Daily Thurnell Lose, MD   1 mg at 01/06/23 1041   furosemide (LASIX) injection 60 mg  60 mg Intravenous Q12H Thurnell Lose, MD       hydrALAZINE (APRESOLINE) injection 10 mg  10 mg Intravenous Q8H PRN Ghimire, Henreitta Leber, MD       hydrALAZINE (APRESOLINE) injection 5 mg  5 mg Intravenous Q20 Min PRN Cherre Robins, MD       HYDROmorphone (DILAUDID) injection 1 mg  1 mg Intravenous Q6H PRN Thurnell Lose, MD   1 mg at 01/06/23 6568   insulin aspart (novoLOG) injection 0-15 Units  0-15 Units Subcutaneous TID WC Thurnell Lose, MD   2 Units at 01/06/23 1204   insulin glargine-yfgn (SEMGLEE) injection 12 Units  12 Units Subcutaneous Daily Thurnell Lose, MD   12 Units at 01/06/23 1215   isosorbide mononitrate (IMDUR) 24 hr tablet 30 mg  30 mg Oral Daily  Jonetta Osgood, MD   30 mg at 01/06/23 0814   labetalol (NORMODYNE) injection 10 mg  10 mg Intravenous Q10 min PRN Cherre Robins, MD       levETIRAcetam (KEPPRA) tablet 500 mg  500 mg Oral BID Jonetta Osgood, MD   500 mg at 01/06/23 1275   LORazepam (ATIVAN) tablet 0.5 mg  0.5 mg Oral Q6H PRN Jonetta Osgood, MD   0.5 mg at 01/03/23 2157   melatonin tablet 3 mg  3 mg Oral QHS Jonetta Osgood, MD   3 mg at 01/05/23 2340   methocarbamol (ROBAXIN) tablet 500 mg  500 mg Oral Q4H PRN Thurnell Lose, MD   500 mg at 01/06/23 1700   metoprolol succinate (TOPROL-XL) 24 hr tablet 12.5 mg  12.5 mg Oral Daily Jonetta Osgood, MD   12.5 mg at 01/06/23 0814   naloxone (NARCAN) injection 0.4 mg  0.4 mg Intravenous PRN Thurnell Lose, MD       nitroGLYCERIN (NITROSTAT) SL tablet 0.4 mg  0.4 mg Sublingual Q5 min PRN Ghimire, Henreitta Leber, MD       ondansetron [facility]) tablet 4 mg  4 mg Oral Q6H PRN Ghimire, Henreitta Leber, MD       Or   ondansetron (ZOFRAN) injection 4 mg  4 mg Intravenous Q6H PRN Ghimire, Henreitta Leber, MD       ondansetron (ZOFRAN) injection 4 mg  4 mg Intravenous Q6H PRN Cherre Robins, MD       oxyCODONE-acetaminophen (PERCOCET/ROXICET) 5-325 MG per tablet 1-2 tablet  1-2 tablet Oral Q4H PRN Thurnell Lose, MD   2 tablet at 01/06/23 1202   polyethylene glycol (MIRALAX / GLYCOLAX) packet 17 g  17 g Oral Daily Jonetta Osgood, MD   17 g at 01/01/23 1001   sodium chloride flush (NS) 0.9 % injection 3 mL  3 mL Intravenous PRN Cherre Robins, MD       Facility-Administered Medications Ordered in Other Encounters  Medication Dose Route Frequency Provider Last Rate Last Admin   sodium chloride 0.9 % 1,000 mL with magnesium sulfate 2 g infusion   Intravenous Continuous Ladell Pier, MD   Stopped at 08/04/14 1441     Discharge Medications: Please see discharge summary for a list of discharge medications.  Relevant Imaging Results:  Relevant  Lab  Results:   Additional Information [id number] Wound/Incision open or dehiced non-pressure wound,toe,anterior,L  Milas Gain, LCSWA

## 2023-01-07 ENCOUNTER — Other Ambulatory Visit: Payer: Self-pay

## 2023-01-07 DIAGNOSIS — I5033 Acute on chronic diastolic (congestive) heart failure: Secondary | ICD-10-CM | POA: Diagnosis not present

## 2023-01-07 LAB — COMPREHENSIVE METABOLIC PANEL
ALT: 9 U/L (ref 0–44)
AST: 17 U/L (ref 15–41)
Albumin: 1.9 g/dL — ABNORMAL LOW (ref 3.5–5.0)
Alkaline Phosphatase: 97 U/L (ref 38–126)
Anion gap: 9 (ref 5–15)
BUN: 69 mg/dL — ABNORMAL HIGH (ref 8–23)
CO2: 28 mmol/L (ref 22–32)
Calcium: 7.8 mg/dL — ABNORMAL LOW (ref 8.9–10.3)
Chloride: 103 mmol/L (ref 98–111)
Creatinine, Ser: 2.52 mg/dL — ABNORMAL HIGH (ref 0.44–1.00)
GFR, Estimated: 19 mL/min — ABNORMAL LOW (ref >60)
Glucose, Bld: 71 mg/dL (ref 70–99)
Potassium: 3.8 mmol/L (ref 3.5–5.1)
Sodium: 140 mmol/L (ref 135–145)
Total Bilirubin: 0.4 mg/dL (ref 0.3–1.2)
Total Protein: 4.5 g/dL — ABNORMAL LOW (ref 6.5–8.1)

## 2023-01-07 LAB — CBC
HCT: 25.1 % — ABNORMAL LOW (ref 36.0–46.0)
Hemoglobin: 7.9 g/dL — ABNORMAL LOW (ref 12.0–15.0)
MCH: 31 pg (ref 26.0–34.0)
MCHC: 31.5 g/dL (ref 30.0–36.0)
MCV: 98.4 fL (ref 80.0–100.0)
Platelets: 101 10*3/uL — ABNORMAL LOW (ref 150–400)
RBC: 2.55 MIL/uL — ABNORMAL LOW (ref 3.87–5.11)
RDW: 16.5 % — ABNORMAL HIGH (ref 11.5–15.5)
WBC: 5.2 10*3/uL (ref 4.0–10.5)
nRBC: 0 % (ref 0.0–0.2)

## 2023-01-07 LAB — GLUCOSE, CAPILLARY
Glucose-Capillary: 72 mg/dL (ref 70–99)
Glucose-Capillary: 182 mg/dL — ABNORMAL HIGH (ref 70–99)
Glucose-Capillary: 95 mg/dL (ref 70–99)
Glucose-Capillary: 87 mg/dL (ref 70–99)

## 2023-01-07 LAB — MAGNESIUM: Magnesium: 2.3 mg/dL (ref 1.7–2.4)

## 2023-01-07 LAB — BRAIN NATRIURETIC PEPTIDE: B Natriuretic Peptide: 568.5 pg/mL — ABNORMAL HIGH (ref 0.0–100.0)

## 2023-01-07 MED ORDER — FUROSEMIDE 10 MG/ML IJ SOLN
60.0000 mg | Freq: Every day | INTRAMUSCULAR | Status: DC
  Administered 2023-01-08: 60 mg via INTRAVENOUS
  Filled 2023-01-07: qty 6

## 2023-01-07 MED ORDER — PROSOURCE PLUS PO LIQD
30.0000 mL | Freq: Two times a day (BID) | ORAL | Status: DC
  Administered 2023-01-07 – 2023-01-09 (×5): 30 mL via ORAL
  Filled 2023-01-07 (×5): qty 30

## 2023-01-07 NOTE — TOC Progression Note (Signed)
Transition of Care [facility]) - Progression Note    Patient Details  Name: [name] MRN: [id number] Date of Birth: [birth year]  Transition of Care [facility] CM/SW [place], [place] Phone Number: 01/07/2023, 3:36 PM  Clinical Narrative:     CSW provided patients spouse with SNF bed offers for patient. Patients spouse accepted SNF bed offer with [facility]. Kitty with [facility] confirmed SNF bed for patient. CSW started insurance authorization for patient [place] ID# [id number]. CSW will continue to follow and assist with patients dc planning needs.  Expected Discharge Plan: [place] Barriers to Discharge: Continued Medical Work up  Expected Discharge Plan and Services In-house Referral: Clinical Social Work     Living arrangements for the past 2 months: Single Family Home                                       Social Determinants of Health (SDOH) Interventions [place]: No Food Insecurity (01/07/2023)  Housing: Low Risk  (12/08/2022)  Transportation Needs: No Transportation Needs (01/07/2023)  Utilities: Not At Risk (12/08/2022)  Alcohol Screen: Low Risk  (10/22/2022)  Depression (PHQ2-9): Low Risk  (10/03/2022)  Financial Resource Strain: Low Risk  (10/22/2022)  Stress: No Stress Concern Present (05/12/2022)  Tobacco Use: Medium Risk (01/06/2023)    Readmission Risk Interventions    12/09/2022    4:35 PM  Readmission Risk Prevention Plan  Transportation Screening Complete  Medication Review (RN Care Manager) Complete  HRI or [place] Complete  SW Recovery Care/Counseling Consult Complete  Palliative Care Screening Not [place] Not Applicable

## 2023-01-07 NOTE — Progress Notes (Signed)
Mobility Specialist - Progress Note   01/07/23 1559  Mobility  Activity Stood at bedside  Level of Assistance Moderate assist, patient does 50-74%  Assistive Device Front wheel walker  Activity Response Tolerated fair  Mobility Referral Yes  $Mobility charge 1 Mobility   Pt received in bed and agreeable to mobility. Pt was ModA to stand from bed. Pt was able to tolerate 3 minutes before fatiguing and requesting to lay back down. Pt left in bed with all needs met.  Franki Monte  Mobility Specialist Please contact via Solicitor or Rehab office at [phone]

## 2023-01-07 NOTE — Plan of Care (Signed)
  Problem: Education: Goal: Understanding of CV disease, CV risk reduction, and recovery process will improve Outcome: Progressing Goal: Individualized Educational Video(s) Outcome: Progressing   Problem: Activity: Goal: Ability to return to baseline activity level will improve Outcome: Progressing   Problem: Cardiovascular: Goal: Ability to achieve and maintain adequate cardiovascular perfusion will improve Outcome: Progressing Goal: Vascular access site(s) Level 0-1 will be maintained Outcome: Progressing   Problem: Health Behavior/Discharge Planning: Goal: Ability to safely manage health-related needs after discharge will improve Outcome: Progressing   Problem: Education: Goal: Ability to demonstrate management of disease process will improve Outcome: Progressing Goal: Ability to verbalize understanding of medication therapies will improve Outcome: Progressing Goal: Individualized Educational Video(s) Outcome: Progressing   Problem: Activity: Goal: Capacity to carry out activities will improve Outcome: Progressing   Problem: Cardiac: Goal: Ability to achieve and maintain adequate cardiopulmonary perfusion will improve Outcome: Progressing   Problem: Education: Goal: Ability to describe self-care measures that may prevent or decrease complications (Diabetes Survival Skills Education) will improve Outcome: Progressing Goal: Individualized Educational Video(s) Outcome: Progressing   Problem: Coping: Goal: Ability to adjust to condition or change in health will improve Outcome: Progressing   Problem: Fluid Volume: Goal: Ability to maintain a balanced intake and output will improve Outcome: Progressing   Problem: Health Behavior/Discharge Planning: Goal: Ability to identify and utilize available resources and services will improve Outcome: Progressing Goal: Ability to manage health-related needs will improve Outcome: Progressing   Problem: Metabolic: Goal:  Ability to maintain appropriate glucose levels will improve Outcome: Progressing   Problem: Nutritional: Goal: Maintenance of adequate nutrition will improve Outcome: Progressing Goal: Progress toward achieving an optimal weight will improve Outcome: Progressing   Problem: Skin Integrity: Goal: Risk for impaired skin integrity will decrease Outcome: Progressing   Problem: Tissue Perfusion: Goal: Adequacy of tissue perfusion will improve Outcome: Progressing   Problem: Education: Goal: Knowledge of General Education information will improve Description: Including pain rating scale, medication(s)/side effects and non-pharmacologic comfort measures Outcome: Progressing   Problem: Health Behavior/Discharge Planning: Goal: Ability to manage health-related needs will improve Outcome: Progressing   Problem: Clinical Measurements: Goal: Ability to maintain clinical measurements within normal limits will improve Outcome: Progressing Goal: Will remain free from infection Outcome: Progressing Goal: Diagnostic test results will improve Outcome: Progressing Goal: Respiratory complications will improve Outcome: Progressing Goal: Cardiovascular complication will be avoided Outcome: Progressing   Problem: Activity: Goal: Risk for activity intolerance will decrease Outcome: Progressing   Problem: Nutrition: Goal: Adequate nutrition will be maintained Outcome: Progressing   Problem: Coping: Goal: Level of anxiety will decrease Outcome: Progressing   Problem: Elimination: Goal: Will not experience complications related to bowel motility Outcome: Progressing Goal: Will not experience complications related to urinary retention Outcome: Progressing   Problem: Pain Managment: Goal: General experience of comfort will improve Outcome: Progressing   Problem: Safety: Goal: Ability to remain free from injury will improve Outcome: Progressing   Problem: Skin Integrity: Goal: Risk  for impaired skin integrity will decrease Outcome: Progressing

## 2023-01-07 NOTE — Progress Notes (Signed)
PROGRESS NOTE        PATIENT DETAILS Name: [name] Age: 82 y.o. Sex: female Date of Birth: 1941/09/03 Admit Date: 12/31/2022 Admitting Physician Evalee Mutton Kristeen Mans, MD [id number] [name], MD  Brief Summary: Patient is a 82 y.o.  female with medical history significant of PAD with critical limb ischemia (dry gangrene of left great toe), chronic HFpEF, PAF on Eliquis, CAD (managed medically-Plavix/statin) CKD stage IV, DM-2, Keppra, history of AML (in remission)-sent from PCPs office for evaluation of worsening lower extremity edema/hypoxemia-found to have acute on chronic HFpEF and left leg critical limb ischemia with dry gangrene of left great toe.   Significant events: 12/30-1/18>> hospitalization for left lower limb critical ischemia-s/p angiogram with balloon angioplasty-treated for UTI/hypokalemia/AKI/HFpEF exacerbation.  Discharged home with home health.   1/25>> sent to ED by PCP-for SOB/worsening leg swelling-HFpEF exacerbation, dry gangrene of left great toe.  Significant studies: 1/1>>1, 25 vitamin D:17.2 (low) 1/1>>25 vitamin D:19.58 (low) 1/3>>PTH 307 (high) 1/25>> CXR: Pulmonary vascular congestion, small bilateral pleural effusions 1/25>> lower extremity arterial duplex: 50-74% stenosis left superficial femoral artery, left popliteal artery and left posterior tibial artery  1/26.  ABIs. Duplex and ABIs reviewed appears to have restenosis of the left SFA and will need likely stenting on Monday.    Significant microbiology data: 1/24>> blood culture: No growth 1/25>> COVID/influenza/RSV PCR: Negative  Procedures: 1. Partial first ray amputation left by podiatrist Dr. Amalia Hailey on 01/03/2023  01/05/23 - Dr Standley Dakins [facility]  PROCEDURE:   1) Ultrasound guided right access 2) Aortogram 3) Left lower extremity angiogram with third order cannulation (~26m contrast) 4) Left femoropopliteal angioplasty and stenting (6x1529m 6x15080m6x80m60mluvia) 5) Conscious sedation (39 minutes)   Consults: Vascular surgery Podiatry  Subjective:  Patient in bed, appears comfortable, denies any headache, no fever, no chest pain or pressure, no shortness of breath , no abdominal pain. No new focal weakness.  Objective: Vitals: Blood pressure (!) 139/56, pulse 98, temperature 97.8 F (36.6 C), temperature source Oral, resp. rate 20, height '5\' 2"'$  (1.575 m), weight 71.5 kg, SpO2 96 %.   Exam:  Awake Alert, No new F.N deficits, Normal affect [place].AT,PERRAL Supple Neck, No JVD,   Symmetrical Chest wall movement, Good air movement bilaterally, CTAB RRR,No Gallops, Rubs or new Murmurs,  +ve B.Sounds, Abd Soft, No tenderness,   Left foot under bandage, 1+ leg edema   Assessment/Plan:  Acute hypoxic respiratory failure 2 acute on chronic diastolic CHF EF 60%.27%e to HFpEF exacerbation Remains on IV Lasix, monitor electrolytes closely. On minimal amount of oxygen-titrate to room air over the next few days. Discussed with nursing staff regarding strict intake/output.   Follow daily weights/electrolytes   Critical limb ischemia-left toe gangrene - seen by both vascular surgery along with podiatry, S/p arteriogram on 1/17 with balloon angioplasty of ostial SFA lesion, proximal SFA lesion, Arterial Doppler on 1/25 Duplex and ABIs reviewed appears to have restenosis of the left SFA, she underwent Partial first ray amputation left on 01/03/2023 by podiatrist Dr. EvanAmalia Haileyllowed by left femoropopliteal angiogram and stenting by Dr. HawkLuan Pullingcular surgeon on 01/05/2023.  Postprocedure recommendation is to continue the patient on Eliquis, Plavix and statin, agreeable for blood transfusion if needed.   Chronic left hip pain with acute postop left foot pain.  Pain medications adjusted.  She is chronically on narcotics.  Hypocalcemia  Recent PTH levels appropriately elevated, vitamin D levels low Just started on calcitriol/Os-Cal yesterday-will  increase calcitriol to 0.5 mcg today, change Os-Cal to 3 times daily dosing IV calcium given on 01/03/2023. Replaced IV calcium on 01/04/2023, 01/05/23 & 01/06/23 - stable now when corrected for albumin  Hypomagnesemia Replaced   CKD stage IV, baseline creatinine around 2.2 At baseline Follow- electrolytes closely   Normocytic anemia Due to CKD No evidence of blood loss Follow CBC closely   PAF Continue metoprolol Eliquis    Moderate protein calorie malnutrition.  Placed on protein supplements.    CAD Treated medically-continue Plavix/statin/beta-blocker No current anginal symptoms   HLD Statin   Seizure disorder Keppra   History of AML-in remission Follows with Dr. [name] as an outpatient  DM-2 (A1c 9.9 on 08/13/2022) Poor glycemic morning of 01/03/2023, oral intake quite spotty hold all insulin for now and monitor on just low-dose before every meal sliding scale.  Recent Labs    01/06/23 1628 01/06/23 2116 01/07/23 0742  GLUCAP 77 92 72       Palliative care Very frail DNR in place Multiple medical issues as above-second hospitalization within a week from discharge At risk for further decompensation Have placed palliative care consultation for goals of care-suspect may benefit from establishment of home hospice on discharge.   BMI: Estimated body mass index is 28.83 kg/m as calculated from the following:   Height as of this encounter: '5\' 2"'$  (1.575 m).   Weight as of this encounter: 71.5 kg.   Code status:   Code Status: DNR   DVT Prophylaxis: IV heparin apixaban (ELIQUIS) tablet 2.5 mg Start: 01/06/23 1145 SCD's Start: 01/05/23 1508 SCDs Start: 01/03/23 1525 Place TED hose Start: 01/03/23 0919 Family Communication: Daughter-Penny-[phone]-left voicemail 01/05/2023 at 9:50 AM   Disposition Plan: Status is: Inpatient Remains inpatient appropriate because: Severity of illness.   Planned Discharge Destination:Home   Diet: Diet Order              Diet regular Room service appropriate? Yes; Fluid consistency: Thin  Diet effective now                     Antimicrobial agents: Anti-infectives (From admission, onward)    None        MEDICATIONS: Scheduled Meds:  (feeding supplement) PROSource Plus  30 mL Oral BID BM   apixaban  2.5 mg Oral BID   ascorbic acid  1,000 mg Oral Daily   atorvastatin  40 mg Oral Daily   calcitRIOL  0.5 mcg Oral Daily   calcium acetate  1,334 mg Oral TID WC   calcium carbonate  1,250 mg Oral TID WC   clopidogrel  75 mg Oral QPM   ferrous sulfate  325 mg Oral BID WC   fluticasone furoate-vilanterol  1 puff Inhalation Daily   folic acid  1 mg Oral Daily   furosemide  60 mg Intravenous Q12H   insulin aspart  0-15 Units Subcutaneous TID WC   insulin glargine-yfgn  12 Units Subcutaneous Daily   isosorbide mononitrate  30 mg Oral Daily   levETIRAcetam  500 mg Oral BID   melatonin  3 mg Oral QHS   metoprolol succinate  12.5 mg Oral Daily   polyethylene glycol  17 g Oral Daily   Continuous Infusions:  sodium chloride     PRN Meds:.sodium chloride, acetaminophen **OR** acetaminophen, acetaminophen, albuterol, dextrose, hydrALAZINE, hydrALAZINE, HYDROmorphone (DILAUDID) injection, labetalol, LORazepam, methocarbamol, naLOXone (NARCAN)  injection, nitroGLYCERIN,  ondansetron **OR** ondansetron (ZOFRAN) IV, ondansetron (ZOFRAN) IV, oxyCODONE-acetaminophen, sodium chloride flush   I have personally reviewed following labs and imaging studies  LABORATORY DATA:  Recent Labs  Lab 12/31/22 1805 01/02/23 0712 01/03/23 0205 01/04/23 0549 01/05/23 0031 01/06/23 0635 01/07/23 0500  WBC 5.8   < > 7.2 10.8* 5.1 4.6 5.2  HGB 9.6*   < > 7.8* 9.4* 8.3* 7.6* 7.9*  HCT 30.8*   < > 23.9* 28.7* 25.7* 24.4* 25.1*  PLT 156   < > 110* 146* 92* 93* 101*  MCV 99.7   < > 95.6 95.7 97.0 98.8 98.4  MCH 31.1   < > 31.2 31.3 31.3 30.8 31.0  MCHC 31.2   < > 32.6 32.8 32.3 31.1 31.5  RDW 17.2*   < > 17.1*  17.0* 16.8* 16.3* 16.5*  LYMPHSABS 0.8  --   --   --   --   --   --   MONOABS 0.4  --   --   --   --   --   --   EOSABS 0.0  --   --   --   --   --   --   BASOSABS 0.0  --   --   --   --   --   --    < > = values in this interval not displayed.    Recent Labs  Lab 12/31/22 1805 01/01/23 0205 01/01/23 0411 01/03/23 0205 01/04/23 0549 01/05/23 0031 01/06/23 0635 01/07/23 0500 01/07/23 0501  NA 140  --    < > 140 139 138 140 140  --   K 4.0  --    < > 3.9 3.9 5.1 4.1 3.8  --   CL 102  --    < > 102 103 102 104 103  --   CO2 27  --    < > '29 25 26 27 28  '$ --   ANIONGAP 11  --    < > '9 11 10 9 9  '$ --   GLUCOSE 244*  --    < > 64* 38* 274* 180* 71  --   BUN 85*  --    < > 83* 81* 75* 69* 69*  --   CREATININE 2.38*  --    < > 2.20* 2.12* 2.29* 2.25* 2.52*  --   AST  --   --   --  '26 28 21 24 17  '$ --   ALT  --   --   --  '14 14 11 11 9  '$ --   ALKPHOS  --   --   --  99 105 113 108 97  --   BILITOT  --   --   --  0.4 0.6 0.4 0.6 0.4  --   ALBUMIN  --   --    < > 2.0* 2.2* 2.1* 1.8* 1.9*  --   LATICACIDVEN 1.2 0.7  --   --   --   --   --   --   --   BNP  --   --    < > 742.1* 572.6* 680.6* 928.5*  --  568.5*  MG  --   --    < > 2.3 2.0 1.9 1.6*  --  2.3  CALCIUM 6.2*  --    < > 6.4* 6.8* 7.3* 7.7* 7.8*  --    < > = values in this interval not displayed.    RADIOLOGY  STUDIES/RESULTS: PERIPHERAL VASCULAR CATHETERIZATION  Result Date: 01/05/2023 DATE OF SERVICE: 01/05/2023  PATIENT:  [name]  82 y.o. female  PRE-OPERATIVE DIAGNOSIS:  Atherosclerosis of native arteries of left lower extremity causing gangrene  POST-OPERATIVE DIAGNOSIS:  Same  PROCEDURE:  1) Ultrasound guided right access 2) Aortogram 3) Left lower extremity angiogram with third order cannulation (~56m contrast) 4) Left femoropopliteal angioplasty and stenting (6x1531m 6x15051m6x80m65muvia) 5) Conscious sedation (39 minutes)   SURGEON:  ThomYevonne AlinewkStanford Breed  ASSISTANT: none  ANESTHESIA:   local and IV sedation   ESTIMATED BLOOD LOSS: minimal  LOCAL MEDICATIONS USED:  LIDOCAINE  COUNTS: confirmed correct.  PATIENT DISPOSITION:  PACU - hemodynamically stable.  Delay start of Pharmacological VTE agent (>24hrs) due to surgical blood loss or risk of bleeding: no  INDICATION FOR PROCEDURE: MariEMMANI LESUEURa 82 y86. female with left great toe gangrene. After careful discussion of risks, benefits, and alternatives the patient was offered angiography with intervention. The patient understood and wished to proceed.  OPERATIVE FINDINGS: Terminal aorta and iliac arteries: Widely patent  Left lower extremity: Common femoral artery: Diffuse disease. widely patent Profunda femoris artery: Diffuse disease. widely patent Superficial femoral artery: Diffuse disease. Restenosis of proximal, mid and distal SFA ~70% greatest area of stenosis. Popliteal artery: Diffuse disease. Above knee popliteal with focal stenosis ~70%. No significant stenosis behind or below the knee. Anterior tibial artery: patent at its origin, tapers to occlusion in calf Tibioperoneal trunk: patent Peroneal artery: patent at its origin, tapers in mid calf Posterior tibial artery: dominant tibial, courses to plantar arch filling the foot Pedal circulation: fills via PT  GLASS score. FP 4. IP 0. Stage III.  WIfI score. 2 / 3 / 1. Stage IV.  DESCRIPTION OF PROCEDURE: After identification of the patient in the pre-operative holding area, the patient was transferred to the operating room. The patient was positioned supine on the operating room table.  Anesthesia was induced. The groins was prepped and draped in standard fashion. A surgical pause was performed confirming correct patient, procedure, and operative location.  The right groin was anesthetized with subcutaneous injection of 1% lidocaine. Using ultrasound guidance, the right common femoral artery was accessed with micropuncture technique. Fluoroscopy was used to confirm cannulation over the femoral head. The 73F  sheath was upsized to 74F.  A Benson wire was advanced into the distal aorta. Over the wire an omni flush catheter was advanced to the level of L2. Aortogram was performed - see above for details.  The left common iliac artery was selected with an omniflush catheter and glidewire advantage guidewire. The wire was advanced into the common femoral artery. Over the wire the omni flush catheter was advanced into the external iliac artery. Selective angiography was performed - see above for details.  The decision was made to intervene. The patient was heparinized with 8,000 units of heparin. The 74F sheath was exchanged for a 61F x 45cm sheath. Selective angiography of the left lower extremity was performed prior to intervention.  The lesions were treated with: Femoropopliteal angioplasty and stenting (6x150mm77m150mm,29m0mm E86ma)  Completion angiography revealed: Resolution of SFA/Pop disease  The sheath was left in place to be removed in the recovery area.  Conscious sedation was administered with the use of IV fentanyl and midazolam under continuous physician and nurse monitoring.  Heart rate, blood pressure, and oxygen saturation were continuously monitored.  Total sedation time was 39 minutes  Upon completion of the case  instrument and sharps counts were confirmed correct. The patient was transferred to the  PACU in good condition. I was present for all portions of the procedure.  PLAN: Plavix '75mg'$  PO QD. Eliquis 2.'5mg'$  PO BID. High intensity statin therapy. Optimized from vascular standpoint. Follow up in 4 weeks with ABI and Duplex.  Yevonne Aline. Stanford Breed, MD [facility] Phone Number: [phone] 01/05/2023 10:56 AM      LOS: 6 days   Signature  -    Lala Lund M.D on 01/07/2023 at 9:10 AM   -  To page go to [email or web address]

## 2023-01-07 NOTE — Progress Notes (Signed)
Physical Therapy Treatment Patient Details Name: [name] MRN: [id number] DOB: May 07, 1941 Today's Date: 01/07/2023   History of Present Illness Pt is an 82 y/o F presenting to ED on 1/24 from PCP for evaluation of worsening LLE edema, found to have acute on chronic HFpEF and LLE ischemia with dry gangrene of L great toe. S/p L first partial ray amputation on 1/27. PMH includes PAD with critical limb ischemia, chronic HFpEF, PAF on Eliquis, CAD, CKD IV, DM2 now s/p angiogram with Left femoropopliteal angioplasty and stenting 1/29.    PT Comments    Pt had fallen asleep on bed pan and bed had become soaked. Pt is supervision for rolling R/L for removal of bed pan and again for placement of clean linens. Pt request deferring mobility and instead being set up for breakfast. D/c plan remains appropriate. PT will continue to follow acutely.    Recommendations for follow up therapy are one component of a multi-disciplinary discharge planning process, led by the attending physician.  Recommendations may be updated based on patient status, additional functional criteria and insurance authorization.  Follow Up Recommendations  Skilled nursing-short term rehab (<3 hours/day) Can patient physically be transported by private vehicle: No   Assistance Recommended at Discharge Frequent or constant Supervision/Assistance  Patient can return home with the following A lot of help with walking and/or transfers;A lot of help with bathing/dressing/bathroom;Help with stairs or ramp for entrance;Assist for transportation   Equipment Recommendations  None recommended by PT       Precautions / Restrictions Precautions Precautions: Fall Restrictions Weight Bearing Restrictions: Yes LLE Weight Bearing: Partial weight bearing LLE Partial Weight Bearing Percentage or Pounds: Heel pressure only     Mobility  Bed Mobility Overal bed mobility: Needs Assistance Bed Mobility: Rolling Rolling: Supervision          General bed mobility comments: supervision for rolling, R/L to get off bed pan and to place clean linens.               Cognition Arousal/Alertness: Awake/alert Behavior During Therapy: WFL for tasks assessed/performed, Anxious   Area of Impairment: Safety/judgement                         Safety/Judgement: Decreased awareness of safety     General Comments: unaware that she had been on bed pan for over 30 min           General Comments General comments (skin integrity, edema, etc.): Vss on RA      Pertinent Vitals/Pain Pain Assessment Pain Assessment: Faces Faces Pain Scale: Hurts a little bit Pain Location: L LE with movement Pain Descriptors / Indicators: Discomfort, Grimacing, Guarding, Moaning Pain Intervention(s): Monitored during session     PT Goals (current goals can now be found in the care plan section) Acute Rehab PT Goals Patient Stated Goal: home PT Goal Formulation: With patient Time For Goal Achievement: 01/20/23 Potential to Achieve Goals: Fair Progress towards PT goals: Progressing toward goals    Frequency    Min 3X/week      PT Plan Current plan remains appropriate       AM-PAC PT "6 Clicks" Mobility   Outcome Measure  Help needed turning from your back to your side while in a flat bed without using bedrails?: None Help needed moving from lying on your back to sitting on the side of a flat bed without using bedrails?: A Lot Help needed moving to and from  a bed to a chair (including a wheelchair)?: A Lot Help needed standing up from a chair using your arms (e.g., wheelchair or bedside chair)?: Total Help needed to walk in hospital room?: Total Help needed climbing 3-5 steps with a railing? : Total 6 Click Score: 11    End of Session   Activity Tolerance: Patient limited by pain Patient left: in bed;with bed alarm set;with call bell/phone within reach Nurse Communication: Mobility status;Other (comment) (check on  redness on buttock from bed pan) PT Visit Diagnosis: Other abnormalities of gait and mobility (R26.89);Pain Pain - Right/Left: Left Pain - part of body: Ankle and joints of foot     Time: 9604-5409 PT Time Calculation (min) (ACUTE ONLY): 21 min  Charges:  $Therapeutic Activity: 8-22 mins                     Gino Garrabrant B. Migdalia Dk PT, DPT Acute Rehabilitation Services Please use secure chat or  Call Office [phone]    [name] 01/07/2023, 10:09 AM

## 2023-01-07 NOTE — Progress Notes (Signed)
Occupational Therapy Treatment Patient Details Name: [name] MRN: [id number] DOB: 20-Dec-1940 Today's Date: 01/07/2023   History of present illness Pt is an 82 y/o F presenting to ED on 1/24 from PCP for evaluation of worsening LLE edema, found to have acute on chronic HFpEF and LLE ischemia with dry gangrene of L great toe. S/p L first partial ray amputation on 1/27. PMH includes PAD with critical limb ischemia, chronic HFpEF, PAF on Eliquis, CAD, CKD IV, DM2 now s/p angiogram with Left femoropopliteal angioplasty and stenting 1/29.   OT comments  Pt progressing towards goals this session, completing UB/LB ADLs with min A, max A for pericare in standing. Pt min A for bed mobility, benefits from supporting LLE when coming to EOB and slowly lowering to prop up on trash can, and then progressing to lowering to floor. Pt min-mod A for transfers with RW. + BM during session. Pt presenting with impairments listed below, will follow acutely. Continue to recommend SNF at d/c.   Recommendations for follow up therapy are one component of a multi-disciplinary discharge planning process, led by the attending physician.  Recommendations may be updated based on patient status, additional functional criteria and insurance authorization.    Follow Up Recommendations  Skilled nursing-short term rehab (<3 hours/day)     Assistance Recommended at Discharge Frequent or constant Supervision/Assistance  Patient can return home with the following  A lot of help with walking and/or transfers;A lot of help with bathing/dressing/bathroom;Assistance with cooking/housework;Assist for transportation;Help with stairs or ramp for entrance   Equipment Recommendations  BSC/3in1    Recommendations for Other Services PT consult    Precautions / Restrictions Precautions Precautions: Fall Precaution Comments: watch O2 Restrictions Weight Bearing Restrictions: Yes LLE Weight Bearing: Partial weight bearing LLE  Partial Weight Bearing Percentage or Pounds: Heel pressure only; post op shoe in room       Mobility Bed Mobility Overal bed mobility: Needs Assistance Bed Mobility: Rolling   Sidelying to sit: Min assist   Sit to supine: Min assist        Transfers Overall transfer level: Needs assistance Equipment used: Rolling walker (2 wheels) Transfers: Sit to/from Stand, Bed to chair/wheelchair/BSC Sit to Stand: Mod assist, Min assist           General transfer comment: min from elevated bed, mod from [facility]     Balance Overall balance assessment: Needs assistance Sitting-balance support: Feet supported Sitting balance-Leahy Scale: Good Sitting balance - Comments: can reach outside BOS without LOB   Standing balance support: Bilateral upper extremity supported, Reliant on assistive device for balance, During functional activity Standing balance-Leahy Scale: Poor Standing balance comment: heavy reliance on RW                           ADL either performed or assessed with clinical judgement   ADL Overall ADL's : Needs assistance/impaired     Grooming: Set up;Brushing hair;Sitting Grooming Details (indicate cue type and reason): seated EOB         Upper Body Dressing : Minimal assistance;Sitting Upper Body Dressing Details (indicate cue type and reason): donning clean gown Lower Body Dressing: Minimal assistance;Sitting/lateral leans Lower Body Dressing Details (indicate cue type and reason): able to doff post op shoe sitting EOB Toilet Transfer: Minimal assistance;Stand-pivot;BSC/3in1;Rolling walker (2 wheels)   Toileting- Clothing Manipulation and Hygiene: Maximal assistance Toileting - Clothing Manipulation Details (indicate cue type and reason): pericare in standing  Functional mobility during ADLs: Moderate assistance;Rolling walker (2 wheels)      Extremity/Trunk Assessment Upper Extremity Assessment Upper Extremity Assessment: Generalized  weakness   Lower Extremity Assessment Lower Extremity Assessment: Defer to PT evaluation        Vision   Vision Assessment?: No apparent visual deficits   Perception Perception Perception: Not tested   Praxis Praxis Praxis: Not tested    Cognition Arousal/Alertness: Awake/alert Behavior During Therapy: WFL for tasks assessed/performed, Anxious Overall Cognitive Status: No family/caregiver present to determine baseline cognitive functioning                                          Exercises      Shoulder Instructions       General Comments VSS on RA; family present    Pertinent Vitals/ Pain       Pain Assessment Pain Assessment: Faces Pain Score: 3  Faces Pain Scale: Hurts a little bit Pain Location: L LE with movement Pain Descriptors / Indicators: Discomfort Pain Intervention(s): Monitored during session, Premedicated before session, Limited activity within patient's tolerance  Home Living                                          Prior Functioning/Environment              Frequency  Min 2X/week        Progress Toward Goals  OT Goals(current goals can now be found in the care plan section)  Progress towards OT goals: Progressing toward goals  Acute Rehab OT Goals Patient Stated Goal: none stated OT Goal Formulation: With patient Time For Goal Achievement: 01/18/23 Potential to Achieve Goals: Good ADL Goals Pt Will Perform Upper Body Dressing: sitting;with min guard assist Pt Will Perform Lower Body Dressing: with min assist;with adaptive equipment;sitting/lateral leans;sit to/from stand Pt Will Transfer to Toilet: with min guard assist;ambulating;regular height toilet Pt Will Perform Tub/Shower Transfer: Tub transfer;Shower transfer;ambulating;with min assist;rolling walker;3 in 1 Additional ADL Goal #1: Pt will be able to stand x5 min for functional task in prep for ADLs  Plan Discharge plan remains  appropriate;Frequency remains appropriate    Co-evaluation                 AM-PAC OT "6 Clicks" Daily Activity     Outcome Measure   Help from another person eating meals?: None Help from another person taking care of personal grooming?: A Little Help from another person toileting, which includes using toliet, bedpan, or urinal?: A Lot Help from another person bathing (including washing, rinsing, drying)?: A Lot Help from another person to put on and taking off regular upper body clothing?: A Little Help from another person to put on and taking off regular lower body clothing?: A Lot 6 Click Score: 16    End of Session Equipment Utilized During Treatment: Gait belt;Rolling walker (2 wheels);Other (comment) (post op shoe)  OT Visit Diagnosis: Unsteadiness on feet (R26.81);Other abnormalities of gait and mobility (R26.89);Muscle weakness (generalized) (M62.81)   Activity Tolerance Patient tolerated treatment well   Patient Left in bed;with call bell/phone within reach;with bed alarm set;with family/visitor present   Nurse Communication Mobility status        Time: 6203-5597 OT Time Calculation (min): 35 min  Charges: OT General  Charges $OT Visit: 1 Visit OT Treatments $Self Care/Home Management : 8-22 mins $Therapeutic Activity: 8-22 mins  Renaye Rakers, OTD, OTR/L SecureChat Preferred Acute Rehab [phone]   Renaye Rakers [place] 01/07/2023, 3:08 PM

## 2023-01-08 DIAGNOSIS — I5033 Acute on chronic diastolic (congestive) heart failure: Secondary | ICD-10-CM | POA: Diagnosis not present

## 2023-01-08 LAB — COMPREHENSIVE METABOLIC PANEL
ALT: 9 U/L (ref 0–44)
AST: 23 U/L (ref 15–41)
Albumin: 1.8 g/dL — ABNORMAL LOW (ref 3.5–5.0)
Alkaline Phosphatase: 101 U/L (ref 38–126)
Anion gap: 8 (ref 5–15)
BUN: 72 mg/dL — ABNORMAL HIGH (ref 8–23)
CO2: 28 mmol/L (ref 22–32)
Calcium: 7.6 mg/dL — ABNORMAL LOW (ref 8.9–10.3)
Chloride: 102 mmol/L (ref 98–111)
Creatinine, Ser: 2.22 mg/dL — ABNORMAL HIGH (ref 0.44–1.00)
GFR, Estimated: 22 mL/min — ABNORMAL LOW (ref >60)
Glucose, Bld: 146 mg/dL — ABNORMAL HIGH (ref 70–99)
Potassium: 4.1 mmol/L (ref 3.5–5.1)
Sodium: 138 mmol/L (ref 135–145)
Total Bilirubin: 0.5 mg/dL (ref 0.3–1.2)
Total Protein: 4.4 g/dL — ABNORMAL LOW (ref 6.5–8.1)

## 2023-01-08 LAB — CBC WITH DIFFERENTIAL/PLATELET
Abs Immature Granulocytes: 0.02 10*3/uL (ref 0.00–0.07)
Basophils Absolute: 0 10*3/uL (ref 0.0–0.1)
Basophils Relative: 0 %
Eosinophils Absolute: 0.2 10*3/uL (ref 0.0–0.5)
Eosinophils Relative: 4 %
HCT: 23.8 % — ABNORMAL LOW (ref 36.0–46.0)
Hemoglobin: 7.7 g/dL — ABNORMAL LOW (ref 12.0–15.0)
Immature Granulocytes: 0 %
Lymphocytes Relative: 18 %
Lymphs Abs: 0.9 10*3/uL (ref 0.7–4.0)
MCH: 31.4 pg (ref 26.0–34.0)
MCHC: 32.4 g/dL (ref 30.0–36.0)
MCV: 97.1 fL (ref 80.0–100.0)
Monocytes Absolute: 0.5 10*3/uL (ref 0.1–1.0)
Monocytes Relative: 9 %
Neutro Abs: 3.5 10*3/uL (ref 1.7–7.7)
Neutrophils Relative %: 69 %
Platelets: 113 10*3/uL — ABNORMAL LOW (ref 150–400)
RBC: 2.45 MIL/uL — ABNORMAL LOW (ref 3.87–5.11)
RDW: 16.5 % — ABNORMAL HIGH (ref 11.5–15.5)
WBC: 5.1 10*3/uL (ref 4.0–10.5)
nRBC: 0 % (ref 0.0–0.2)

## 2023-01-08 LAB — GLUCOSE, CAPILLARY
Glucose-Capillary: 174 mg/dL — ABNORMAL HIGH (ref 70–99)
Glucose-Capillary: 153 mg/dL — ABNORMAL HIGH (ref 70–99)
Glucose-Capillary: 58 mg/dL — ABNORMAL LOW (ref 70–99)
Glucose-Capillary: 73 mg/dL (ref 70–99)
Glucose-Capillary: 127 mg/dL — ABNORMAL HIGH (ref 70–99)

## 2023-01-08 LAB — BRAIN NATRIURETIC PEPTIDE: B Natriuretic Peptide: 509.9 pg/mL — ABNORMAL HIGH (ref 0.0–100.0)

## 2023-01-08 LAB — MAGNESIUM: Magnesium: 2 mg/dL (ref 1.7–2.4)

## 2023-01-08 MED ORDER — OXYCODONE-ACETAMINOPHEN 5-325 MG PO TABS: 1.0000 | ORAL_TABLET | Freq: Four times a day (QID) | ORAL | 0 refills | Status: AC | PRN

## 2023-01-08 MED ORDER — SPIRONOLACTONE 25 MG PO TABS
25.0000 mg | ORAL_TABLET | Freq: Once | ORAL | Status: AC
  Administered 2023-01-08: 25 mg via ORAL
  Filled 2023-01-08: qty 1

## 2023-01-08 NOTE — Plan of Care (Signed)
  Problem: Education: Goal: Understanding of CV disease, CV risk reduction, and recovery process will improve Outcome: Progressing Goal: Individualized Educational Video(s) Outcome: Progressing   Problem: Activity: Goal: Ability to return to baseline activity level will improve Outcome: Progressing   Problem: Cardiovascular: Goal: Ability to achieve and maintain adequate cardiovascular perfusion will improve Outcome: Progressing Goal: Vascular access site(s) Level 0-1 will be maintained Outcome: Progressing   Problem: Health Behavior/Discharge Planning: Goal: Ability to safely manage health-related needs after discharge will improve Outcome: Progressing   Problem: Education: Goal: Ability to demonstrate management of disease process will improve Outcome: Progressing Goal: Ability to verbalize understanding of medication therapies will improve Outcome: Progressing Goal: Individualized Educational Video(s) Outcome: Progressing   Problem: Activity: Goal: Capacity to carry out activities will improve Outcome: Progressing   Problem: Cardiac: Goal: Ability to achieve and maintain adequate cardiopulmonary perfusion will improve Outcome: Progressing   Problem: Education: Goal: Ability to describe self-care measures that may prevent or decrease complications (Diabetes Survival Skills Education) will improve Outcome: Progressing Goal: Individualized Educational Video(s) Outcome: Progressing   Problem: Coping: Goal: Ability to adjust to condition or change in health will improve Outcome: Progressing   Problem: Fluid Volume: Goal: Ability to maintain a balanced intake and output will improve Outcome: Progressing   Problem: Health Behavior/Discharge Planning: Goal: Ability to identify and utilize available resources and services will improve Outcome: Progressing Goal: Ability to manage health-related needs will improve Outcome: Progressing   Problem: Metabolic: Goal:  Ability to maintain appropriate glucose levels will improve Outcome: Progressing   Problem: Nutritional: Goal: Maintenance of adequate nutrition will improve Outcome: Progressing Goal: Progress toward achieving an optimal weight will improve Outcome: Progressing   Problem: Skin Integrity: Goal: Risk for impaired skin integrity will decrease Outcome: Progressing   Problem: Tissue Perfusion: Goal: Adequacy of tissue perfusion will improve Outcome: Progressing   Problem: Education: Goal: Knowledge of General Education information will improve Description: Including pain rating scale, medication(s)/side effects and non-pharmacologic comfort measures Outcome: Progressing   Problem: Health Behavior/Discharge Planning: Goal: Ability to manage health-related needs will improve Outcome: Progressing   Problem: Clinical Measurements: Goal: Ability to maintain clinical measurements within normal limits will improve Outcome: Progressing Goal: Will remain free from infection Outcome: Progressing Goal: Diagnostic test results will improve Outcome: Progressing Goal: Respiratory complications will improve Outcome: Progressing Goal: Cardiovascular complication will be avoided Outcome: Progressing   Problem: Activity: Goal: Risk for activity intolerance will decrease Outcome: Progressing   Problem: Nutrition: Goal: Adequate nutrition will be maintained Outcome: Progressing   Problem: Coping: Goal: Level of anxiety will decrease Outcome: Progressing   Problem: Elimination: Goal: Will not experience complications related to bowel motility Outcome: Progressing Goal: Will not experience complications related to urinary retention Outcome: Progressing   Problem: Pain Managment: Goal: General experience of comfort will improve Outcome: Progressing   Problem: Safety: Goal: Ability to remain free from injury will improve Outcome: Progressing   Problem: Skin Integrity: Goal: Risk  for impaired skin integrity will decrease Outcome: Progressing

## 2023-01-08 NOTE — Progress Notes (Signed)
Mobility Specialist - Progress Note   01/08/23 1135  Mobility  Activity Transferred to/from [facility]  Level of Assistance Moderate assist, patient does 50-74%  Assistive Device Front wheel walker  Activity Response Tolerated well  Mobility Referral Yes  $Mobility charge 1 Mobility   Pt received in bed and agreeable to session. Upon EOB pt requested to use BSC. Pt was ModA to stand and take steps to [facility]. Further mobility was deferred d/t pt needing time for BM. Pt left on [facility] with all needs met. NT made aware.   Franki Monte  Mobility Specialist Please contact via Solicitor or Rehab office at [phone]

## 2023-01-08 NOTE — Progress Notes (Signed)
PROGRESS NOTE        PATIENT DETAILS Name: [name] Age: 82 y.o. Sex: female Date of Birth: 05/14/1941 Admit Date: 12/31/2022 Admitting Physician Evalee Mutton Kristeen Mans, MD SEG:[id number] [name], MD  Brief Summary: Patient is a 82 y.o.  female with medical history significant of PAD with critical limb ischemia (dry gangrene of left great toe), chronic HFpEF, PAF on Eliquis, CAD (managed medically-Plavix/statin) CKD stage IV, DM-2, Keppra, history of AML (in remission)-sent from PCPs office for evaluation of worsening lower extremity edema/hypoxemia-found to have acute on chronic HFpEF and left leg critical limb ischemia with dry gangrene of left great toe.   Significant events: 12/30-1/18>> hospitalization for left lower limb critical ischemia-s/p angiogram with balloon angioplasty-treated for UTI/hypokalemia/AKI/HFpEF exacerbation.  Discharged home with home health.   1/25>> sent to ED by PCP-for SOB/worsening leg swelling-HFpEF exacerbation, dry gangrene of left great toe.  Significant studies: 1/1>>1, 25 vitamin D:17.2 (low) 1/1>>25 vitamin D:19.58 (low) 1/3>>PTH 307 (high) 1/25>> CXR: Pulmonary vascular congestion, small bilateral pleural effusions 1/25>> lower extremity arterial duplex: 50-74% stenosis left superficial femoral artery, left popliteal artery and left posterior tibial artery  1/26.  ABIs. Duplex and ABIs reviewed appears to have restenosis of the left SFA and will need likely stenting on Monday.    Significant microbiology data: 1/24>> blood culture: No growth 1/25>> COVID/influenza/RSV PCR: Negative  Procedures: 1. Partial first ray amputation left by podiatrist Dr. Amalia Hailey on 01/03/2023  01/05/23 - Dr Standley Dakins [facility]  PROCEDURE:   1) Ultrasound guided right access 2) Aortogram 3) Left lower extremity angiogram with third order cannulation (~2m contrast) 4) Left femoropopliteal angioplasty and stenting (6x1510m [id number]) 5) Conscious sedation (39 minutes)   Consults: Vascular surgery Podiatry  Subjective:  Patient in bed, appears comfortable, denies any headache, no fever, no chest pain or pressure, no shortness of breath , no abdominal pain. No new focal weakness.  Objective: Vitals: Blood pressure (!) 105/58, pulse 72, temperature (!) 97.3 F (36.3 C), temperature source Axillary, resp. rate 20, height '5\' 2"'$  (1.575 m), weight 70.2 kg, SpO2 96 %.   Exam:  Awake Alert, No new F.N deficits, Normal affect [place].AT,PERRAL Supple Neck, No JVD,   Symmetrical Chest wall movement, Good air movement bilaterally, CTAB RRR,No Gallops, Rubs or new Murmurs,  +ve B.Sounds, Abd Soft, No tenderness,   Left foot under bandage, 1+ leg edema   Assessment/Plan:  Acute hypoxic respiratory failure 2 acute on chronic diastolic CHF EF 60%.73%e to HFpEF exacerbation Remains on IV Lasix, monitor electrolytes closely. On minimal amount of oxygen-titrate to room air over the next few days. Discussed with nursing staff regarding strict intake/output.   Follow daily weights/electrolytes   Critical limb ischemia-left toe gangrene - seen by both vascular surgery along with podiatry, S/p arteriogram on 1/17 with balloon angioplasty of ostial SFA lesion, proximal SFA lesion, Arterial Doppler on 1/25 Duplex and ABIs reviewed appears to have restenosis of the left SFA, she underwent Partial first ray amputation left on 01/03/2023 by podiatrist Dr. EvanAmalia Haileyllowed by left femoropopliteal angiogram and stenting by Dr. HawkLuan Pullingcular surgeon on 01/05/2023.  Postprocedure recommendation is to continue the patient on Eliquis, Plavix and statin, agreeable for blood transfusion if needed.  Case discussed with vascular surgery Dr. HawkLuan Pullingy for discharge with outpatient follow-up with him along with Dr. EvanAmalia Hailey recommends  to discharge the patient with the present dressing, first dressing change in his office within a week of  discharge, WBAT heel pressure in a postsurgical shoe, postsurgical shoe room.   Chronic left hip pain with acute postop left foot pain.  Pain medications adjusted.  She is chronically on narcotics.  Hypocalcemia  Recent PTH levels appropriately elevated, vitamin D levels low Just started on calcitriol/Os-Cal yesterday-will increase calcitriol to 0.5 mcg today, change Os-Cal to 3 times daily dosing IV calcium given on 01/03/2023. Replaced IV calcium on 01/04/2023, 01/05/23 & 01/06/23 - stable now when corrected for albumin  Hypomagnesemia Replaced   CKD stage IV, baseline creatinine around 2.2 At baseline Follow- electrolytes closely   Normocytic anemia Due to CKD No evidence of blood loss Follow CBC closely   PAF Continue metoprolol Eliquis    Moderate protein calorie malnutrition.  Placed on protein supplements.    CAD Treated medically-continue Plavix/statin/beta-blocker No current anginal symptoms   HLD Statin   Seizure disorder Keppra   History of AML-in remission Follows with Dr. [name] as an outpatient  DM-2 (A1c 9.9 on 08/13/2022) Poor glycemic morning of 01/03/2023, oral intake quite spotty hold all insulin for now and monitor on just low-dose before every meal sliding scale.  Recent Labs    01/07/23 1549 01/07/23 2256 01/08/23 0749  GLUCAP 95 87 174*       Palliative care Very frail DNR in place Multiple medical issues as above-second hospitalization within a week from discharge At risk for further decompensation Have placed palliative care consultation for goals of care-suspect may benefit from establishment of home hospice on discharge.   BMI: Estimated body mass index is 28.31 kg/m as calculated from the following:   Height as of this encounter: '5\' 2"'$  (1.575 m).   Weight as of this encounter: 70.2 kg.   Code status:   Code Status: DNR   DVT Prophylaxis: IV heparin apixaban (ELIQUIS) tablet 2.5 mg Start: 01/06/23 1145 SCD's Start: 01/05/23  1508 SCDs Start: 01/03/23 1525 Place TED hose Start: 01/03/23 0919 Family Communication: Daughter-Penny-[phone]-left voicemail 01/05/2023 at 9:50 AM   Disposition Plan: Status is: Inpatient Remains inpatient appropriate because: Severity of illness.   Planned Discharge Destination:Home   Diet: Diet Order             Diet regular Room service appropriate? Yes; Fluid consistency: Thin  Diet effective now                     Antimicrobial agents: Anti-infectives (From admission, onward)    None        MEDICATIONS: Scheduled Meds:  (feeding supplement) PROSource Plus  30 mL Oral BID BM   apixaban  2.5 mg Oral BID   ascorbic acid  1,000 mg Oral Daily   atorvastatin  40 mg Oral Daily   calcitRIOL  0.5 mcg Oral Daily   calcium acetate  1,334 mg Oral TID WC   calcium carbonate  1,250 mg Oral TID WC   clopidogrel  75 mg Oral QPM   ferrous sulfate  325 mg Oral BID WC   fluticasone furoate-vilanterol  1 puff Inhalation Daily   folic acid  1 mg Oral Daily   furosemide  60 mg Intravenous Daily   insulin aspart  0-15 Units Subcutaneous TID WC   insulin glargine-yfgn  12 Units Subcutaneous Daily   isosorbide mononitrate  30 mg Oral Daily   levETIRAcetam  500 mg Oral BID   melatonin  3 mg Oral  QHS   metoprolol succinate  12.5 mg Oral Daily   polyethylene glycol  17 g Oral Daily   Continuous Infusions:  sodium chloride     PRN Meds:.sodium chloride, acetaminophen **OR** acetaminophen, acetaminophen, albuterol, dextrose, hydrALAZINE, hydrALAZINE, HYDROmorphone (DILAUDID) injection, labetalol, LORazepam, methocarbamol, naLOXone (NARCAN)  injection, nitroGLYCERIN, ondansetron **OR** ondansetron (ZOFRAN) IV, ondansetron (ZOFRAN) IV, oxyCODONE-acetaminophen, sodium chloride flush   I have personally reviewed following labs and imaging studies  LABORATORY DATA:  Recent Labs  Lab 01/04/23 0549 01/05/23 0031 01/06/23 0635 01/07/23 0500 01/08/23 0506  WBC 10.8*  5.1 4.6 5.2 5.1  HGB 9.4* 8.3* 7.6* 7.9* 7.7*  HCT 28.7* 25.7* 24.4* 25.1* 23.8*  PLT 146* 92* 93* 101* 113*  MCV 95.7 97.0 98.8 98.4 97.1  MCH 31.3 31.3 30.8 31.0 31.4  MCHC 32.8 32.3 31.1 31.5 32.4  RDW 17.0* 16.8* 16.3* 16.5* 16.5*  LYMPHSABS  --   --   --   --  0.9  MONOABS  --   --   --   --  0.5  EOSABS  --   --   --   --  0.2  BASOSABS  --   --   --   --  0.0    Recent Labs  Lab 01/04/23 0549 01/05/23 0031 01/06/23 0635 01/07/23 0500 01/07/23 0501 01/08/23 0506  NA 139 138 140 140  --  138  K 3.9 5.1 4.1 3.8  --  4.1  CL 103 102 104 103  --  102  CO2 '25 26 27 28  '$ --  28  ANIONGAP '11 10 9 9  '$ --  8  GLUCOSE 38* 274* 180* 71  --  146*  BUN 81* 75* 69* 69*  --  72*  CREATININE 2.12* 2.29* 2.25* 2.52*  --  2.22*  AST '28 21 24 17  '$ --  23  ALT '14 11 11 9  '$ --  9  ALKPHOS 105 113 108 97  --  101  BILITOT 0.6 0.4 0.6 0.4  --  0.5  ALBUMIN 2.2* 2.1* 1.8* 1.9*  --  1.8*  BNP 572.6* 680.6* 928.5*  --  568.5* 509.9*  MG 2.0 1.9 1.6*  --  2.3 2.0  CALCIUM 6.8* 7.3* 7.7* 7.8*  --  7.6*    RADIOLOGY STUDIES/RESULTS: No results found.   LOS: 7 days   Signature  -    Lala Lund M.D on 01/08/2023 at 9:21 AM   -  To page go to [email or web address]

## 2023-01-08 NOTE — TOC Progression Note (Signed)
Transition of Care [facility]) - Progression Note    Patient Details  Name: [name] MRN: [id number] Date of Birth: Jul 26, 1941  Transition of [facility]) CM/SW Foster, [place] Phone Number: 01/08/2023, 1:44 PM  Clinical Narrative:     Patient has SN[facility] bed at [facility]. Insurance authorization has been approved. Plan Auth ID# [id number] [place] ID# [id number]. Insurance authorization approved from 2/1-2/5. CSW informed by MD patient not medically ready today. CSW informed facility. CSW will continue to follow and assist with patients dc planning needs.   Expected Discharge Plan: [place] Barriers to Discharge: Continued Medical Work up  Expected Discharge Plan and Services In-house Referral: Clinical Social Work     Living arrangements for the past 2 months: Single Family Home                                       Social Determinants of Health (SDOH) Interventions [place]: No Food Insecurity (01/07/2023)  Housing: Low Risk  (01/07/2023)  Transportation Needs: No Transportation Needs (01/07/2023)  Utilities: Not At Risk (01/07/2023)  Alcohol Screen: Low Risk  (10/22/2022)  Depression (PHQ2-9): Low Risk  (10/03/2022)  Financial Resource Strain: Low Risk  (10/22/2022)  Stress: No Stress Concern Present (05/12/2022)  Tobacco Use: Medium Risk (01/06/2023)    Readmission Risk Interventions    12/09/2022    4:35 PM  Readmission Risk Prevention Plan  Transportation Screening Complete  Medication Review (RN Care Manager) Complete  HRI or Home Care Consult Complete  SW Recovery Care/Counseling Consult Complete  Palliative Care Screening Not [place] Not Applicable

## 2023-01-08 NOTE — Progress Notes (Signed)
    Subjective: 3 Days Post-Op Procedure(s) (LRB): ABDOMINAL AORTOGRAM W/LOWER EXTREMITY (N/A) PERIPHERAL VASCULAR INTERVENTION Procedure: Partial first ray amputation left DOS: 01/03/2023  Patient doing well.  Resting comfortably in bed.  No pain  Objective: Vital signs in last 24 hours: Temp:  [97.3 F (36.3 C)-97.9 F (36.6 C)] 97.5 F (36.4 C) (02/01 1330) Pulse Rate:  [72-95] 95 (02/01 1330) Resp:  [16-20] 18 (02/01 1330) BP: (99-138)/(54-91) 138/86 (02/01 1330) SpO2:  [93 %-98 %] 94 % (02/01 1142) Weight:  [70.2 kg] 70.2 kg (02/01 0328)   Recent Labs    01/06/23 0635 01/07/23 0500 01/08/23 0506  HGB 7.6* 7.9* 7.7*    Recent Labs    01/07/23 0500 01/08/23 0506  WBC 5.2 5.1  RBC 2.55* 2.45*  HCT 25.1* 23.8*  PLT 101* 113*    Recent Labs    01/07/23 0500 01/08/23 0506  NA 140 138  K 3.8 4.1  CL 103 102  CO2 28 28  BUN 69* 72*  CREATININE 2.52* 2.22*  GLUCOSE 71 146*  CALCIUM 7.8* 7.6*       Dressings are clean and dry.  No strikethrough.  Skin incision well coapted.  Sutures intact.  No appreciable erythema or edema around the foot.  Skin is warm to touch.  Assessment/Plan: 3 Days Post-Op Procedure(s) (LRB): ABDOMINAL AORTOGRAM W/LOWER EXTREMITY (N/A) PERIPHERAL VASCULAR INTERVENTION DOS: 01/03/2023 -Dressings changed.  Leave clean dry and intact. -WBAT heel pressure in a postsurgical shoe. - Okay to discharge from podiatric standpoint. -No dressing change until follow-up. -Follow-up with Dr. Amalia Hailey 1 week from discharge -Patient is optimized from vascular standpoint.  We will continue to follow clinically in an outpatient setting to assess for healing.  Felipa Furnace 01/08/2023, 4:01 PM

## 2023-01-08 NOTE — Progress Notes (Signed)
Hypoglycemic Event  CBG: 58  Treatment: 4 oz juice/soda  Symptoms: Pale  Follow-up CBG: Time: 1638 CBG Result:73  Possible Reasons for Event: Inadequate meal intake and Change in activity  Comments/MD notified: Dr. Candiss Norse notified, orders placed to continue to check blood sugars and DC insulin    [name] N [name]

## 2023-01-09 DIAGNOSIS — I110 Hypertensive heart disease with heart failure: Secondary | ICD-10-CM | POA: Diagnosis not present

## 2023-01-09 DIAGNOSIS — I70262 Atherosclerosis of native arteries of extremities with gangrene, left leg: Secondary | ICD-10-CM | POA: Diagnosis not present

## 2023-01-09 DIAGNOSIS — Z741 Need for assistance with personal care: Secondary | ICD-10-CM | POA: Diagnosis not present

## 2023-01-09 DIAGNOSIS — I480 Paroxysmal atrial fibrillation: Secondary | ICD-10-CM | POA: Diagnosis not present

## 2023-01-09 DIAGNOSIS — I2510 Atherosclerotic heart disease of native coronary artery without angina pectoris: Secondary | ICD-10-CM | POA: Diagnosis not present

## 2023-01-09 DIAGNOSIS — I4819 Other persistent atrial fibrillation: Secondary | ICD-10-CM | POA: Diagnosis not present

## 2023-01-09 DIAGNOSIS — E1165 Type 2 diabetes mellitus with hyperglycemia: Secondary | ICD-10-CM | POA: Diagnosis present

## 2023-01-09 DIAGNOSIS — E440 Moderate protein-calorie malnutrition: Secondary | ICD-10-CM | POA: Diagnosis not present

## 2023-01-09 DIAGNOSIS — J9611 Chronic respiratory failure with hypoxia: Secondary | ICD-10-CM | POA: Diagnosis not present

## 2023-01-09 DIAGNOSIS — D638 Anemia in other chronic diseases classified elsewhere: Secondary | ICD-10-CM | POA: Diagnosis present

## 2023-01-09 DIAGNOSIS — R1900 Intra-abdominal and pelvic swelling, mass and lump, unspecified site: Secondary | ICD-10-CM | POA: Diagnosis not present

## 2023-01-09 DIAGNOSIS — R569 Unspecified convulsions: Secondary | ICD-10-CM | POA: Diagnosis not present

## 2023-01-09 DIAGNOSIS — R2689 Other abnormalities of gait and mobility: Secondary | ICD-10-CM | POA: Diagnosis not present

## 2023-01-09 DIAGNOSIS — G9341 Metabolic encephalopathy: Secondary | ICD-10-CM | POA: Diagnosis not present

## 2023-01-09 DIAGNOSIS — G40909 Epilepsy, unspecified, not intractable, without status epilepticus: Secondary | ICD-10-CM | POA: Diagnosis present

## 2023-01-09 DIAGNOSIS — Z7401 Bed confinement status: Secondary | ICD-10-CM | POA: Diagnosis not present

## 2023-01-09 DIAGNOSIS — Z66 Do not resuscitate: Secondary | ICD-10-CM | POA: Diagnosis not present

## 2023-01-09 DIAGNOSIS — J9621 Acute and chronic respiratory failure with hypoxia: Secondary | ICD-10-CM | POA: Diagnosis not present

## 2023-01-09 DIAGNOSIS — I5022 Chronic systolic (congestive) heart failure: Secondary | ICD-10-CM | POA: Diagnosis not present

## 2023-01-09 DIAGNOSIS — I083 Combined rheumatic disorders of mitral, aortic and tricuspid valves: Secondary | ICD-10-CM | POA: Diagnosis present

## 2023-01-09 DIAGNOSIS — R54 Age-related physical debility: Secondary | ICD-10-CM | POA: Diagnosis not present

## 2023-01-09 DIAGNOSIS — I10 Essential (primary) hypertension: Secondary | ICD-10-CM | POA: Diagnosis not present

## 2023-01-09 DIAGNOSIS — E669 Obesity, unspecified: Secondary | ICD-10-CM | POA: Diagnosis present

## 2023-01-09 DIAGNOSIS — R001 Bradycardia, unspecified: Secondary | ICD-10-CM | POA: Diagnosis not present

## 2023-01-09 DIAGNOSIS — I429 Cardiomyopathy, unspecified: Secondary | ICD-10-CM | POA: Diagnosis present

## 2023-01-09 DIAGNOSIS — Z4781 Encounter for orthopedic aftercare following surgical amputation: Secondary | ICD-10-CM | POA: Diagnosis not present

## 2023-01-09 DIAGNOSIS — R9431 Abnormal electrocardiogram [ECG] [EKG]: Secondary | ICD-10-CM | POA: Diagnosis not present

## 2023-01-09 DIAGNOSIS — E11649 Type 2 diabetes mellitus with hypoglycemia without coma: Secondary | ICD-10-CM | POA: Diagnosis not present

## 2023-01-09 DIAGNOSIS — R5383 Other fatigue: Secondary | ICD-10-CM | POA: Diagnosis not present

## 2023-01-09 DIAGNOSIS — I509 Heart failure, unspecified: Secondary | ICD-10-CM | POA: Diagnosis not present

## 2023-01-09 DIAGNOSIS — C9201 Acute myeloblastic leukemia, in remission: Secondary | ICD-10-CM | POA: Diagnosis present

## 2023-01-09 DIAGNOSIS — R188 Other ascites: Secondary | ICD-10-CM | POA: Diagnosis present

## 2023-01-09 DIAGNOSIS — K7460 Unspecified cirrhosis of liver: Secondary | ICD-10-CM | POA: Diagnosis present

## 2023-01-09 DIAGNOSIS — E1122 Type 2 diabetes mellitus with diabetic chronic kidney disease: Secondary | ICD-10-CM | POA: Diagnosis present

## 2023-01-09 DIAGNOSIS — R531 Weakness: Secondary | ICD-10-CM | POA: Diagnosis not present

## 2023-01-09 DIAGNOSIS — R0602 Shortness of breath: Secondary | ICD-10-CM | POA: Diagnosis not present

## 2023-01-09 DIAGNOSIS — R0902 Hypoxemia: Secondary | ICD-10-CM | POA: Diagnosis not present

## 2023-01-09 DIAGNOSIS — D329 Benign neoplasm of meninges, unspecified: Secondary | ICD-10-CM | POA: Diagnosis not present

## 2023-01-09 DIAGNOSIS — Z1152 Encounter for screening for COVID-19: Secondary | ICD-10-CM | POA: Diagnosis not present

## 2023-01-09 DIAGNOSIS — J90 Pleural effusion, not elsewhere classified: Secondary | ICD-10-CM | POA: Diagnosis not present

## 2023-01-09 DIAGNOSIS — J449 Chronic obstructive pulmonary disease, unspecified: Secondary | ICD-10-CM | POA: Diagnosis present

## 2023-01-09 DIAGNOSIS — I4892 Unspecified atrial flutter: Secondary | ICD-10-CM | POA: Diagnosis present

## 2023-01-09 DIAGNOSIS — M6259 Muscle wasting and atrophy, not elsewhere classified, multiple sites: Secondary | ICD-10-CM | POA: Diagnosis not present

## 2023-01-09 DIAGNOSIS — E119 Type 2 diabetes mellitus without complications: Secondary | ICD-10-CM | POA: Diagnosis not present

## 2023-01-09 DIAGNOSIS — E8770 Fluid overload, unspecified: Secondary | ICD-10-CM | POA: Diagnosis not present

## 2023-01-09 DIAGNOSIS — M6281 Muscle weakness (generalized): Secondary | ICD-10-CM | POA: Diagnosis not present

## 2023-01-09 DIAGNOSIS — D696 Thrombocytopenia, unspecified: Secondary | ICD-10-CM | POA: Diagnosis present

## 2023-01-09 DIAGNOSIS — C50912 Malignant neoplasm of unspecified site of left female breast: Secondary | ICD-10-CM | POA: Diagnosis present

## 2023-01-09 DIAGNOSIS — R1311 Dysphagia, oral phase: Secondary | ICD-10-CM | POA: Diagnosis not present

## 2023-01-09 DIAGNOSIS — B379 Candidiasis, unspecified: Secondary | ICD-10-CM | POA: Diagnosis not present

## 2023-01-09 DIAGNOSIS — Z515 Encounter for palliative care: Secondary | ICD-10-CM | POA: Diagnosis not present

## 2023-01-09 DIAGNOSIS — I5033 Acute on chronic diastolic (congestive) heart failure: Secondary | ICD-10-CM | POA: Diagnosis not present

## 2023-01-09 DIAGNOSIS — N184 Chronic kidney disease, stage 4 (severe): Secondary | ICD-10-CM | POA: Diagnosis not present

## 2023-01-09 DIAGNOSIS — I130 Hypertensive heart and chronic kidney disease with heart failure and stage 1 through stage 4 chronic kidney disease, or unspecified chronic kidney disease: Secondary | ICD-10-CM | POA: Diagnosis not present

## 2023-01-09 LAB — CBC WITH DIFFERENTIAL/PLATELET
Abs Immature Granulocytes: 0.01 10*3/uL (ref 0.00–0.07)
Basophils Absolute: 0 10*3/uL (ref 0.0–0.1)
Basophils Relative: 0 %
Eosinophils Absolute: 0.2 10*3/uL (ref 0.0–0.5)
Eosinophils Relative: 3 %
HCT: 24.9 % — ABNORMAL LOW (ref 36.0–46.0)
Hemoglobin: 7.8 g/dL — ABNORMAL LOW (ref 12.0–15.0)
Immature Granulocytes: 0 %
Lymphocytes Relative: 20 %
Lymphs Abs: 1.2 10*3/uL (ref 0.7–4.0)
MCH: 31.1 pg (ref 26.0–34.0)
MCHC: 31.3 g/dL (ref 30.0–36.0)
MCV: 99.2 fL (ref 80.0–100.0)
Monocytes Absolute: 0.6 10*3/uL (ref 0.1–1.0)
Monocytes Relative: 11 %
Neutro Abs: 3.7 10*3/uL (ref 1.7–7.7)
Neutrophils Relative %: 66 %
Platelets: 124 10*3/uL — ABNORMAL LOW (ref 150–400)
RBC: 2.51 MIL/uL — ABNORMAL LOW (ref 3.87–5.11)
RDW: 16.7 % — ABNORMAL HIGH (ref 11.5–15.5)
WBC: 5.7 10*3/uL (ref 4.0–10.5)
nRBC: 0 % (ref 0.0–0.2)

## 2023-01-09 LAB — GLUCOSE, CAPILLARY
Glucose-Capillary: 141 mg/dL — ABNORMAL HIGH (ref 70–99)
Glucose-Capillary: 96 mg/dL (ref 70–99)
Glucose-Capillary: 174 mg/dL — ABNORMAL HIGH (ref 70–99)

## 2023-01-09 LAB — BRAIN NATRIURETIC PEPTIDE: B Natriuretic Peptide: 487.4 pg/mL — ABNORMAL HIGH (ref 0.0–100.0)

## 2023-01-09 LAB — COMPREHENSIVE METABOLIC PANEL
ALT: 9 U/L (ref 0–44)
AST: 18 U/L (ref 15–41)
Albumin: 1.8 g/dL — ABNORMAL LOW (ref 3.5–5.0)
Alkaline Phosphatase: 104 U/L (ref 38–126)
Anion gap: 7 (ref 5–15)
BUN: 74 mg/dL — ABNORMAL HIGH (ref 8–23)
CO2: 28 mmol/L (ref 22–32)
Calcium: 7.7 mg/dL — ABNORMAL LOW (ref 8.9–10.3)
Chloride: 105 mmol/L (ref 98–111)
Creatinine, Ser: 2.56 mg/dL — ABNORMAL HIGH (ref 0.44–1.00)
GFR, Estimated: 18 mL/min — ABNORMAL LOW (ref >60)
Glucose, Bld: 54 mg/dL — ABNORMAL LOW (ref 70–99)
Potassium: 4.1 mmol/L (ref 3.5–5.1)
Sodium: 140 mmol/L (ref 135–145)
Total Bilirubin: 0.5 mg/dL (ref 0.3–1.2)
Total Protein: 4.5 g/dL — ABNORMAL LOW (ref 6.5–8.1)

## 2023-01-09 LAB — MAGNESIUM: Magnesium: 2.2 mg/dL (ref 1.7–2.4)

## 2023-01-09 MED ORDER — HEPARIN SOD (PORK) LOCK FLUSH 100 UNIT/ML IV SOLN
500.0000 [IU] | INTRAVENOUS | Status: AC | PRN
  Administered 2023-01-09: 500 [IU]

## 2023-01-09 MED ORDER — FOLIC ACID 1 MG PO TABS: 1.0000 mg | ORAL_TABLET | Freq: Every day | ORAL | Status: AC

## 2023-01-09 MED ORDER — INSULIN ASPART 100 UNIT/ML FLEXPEN: PEN_INJECTOR | SUBCUTANEOUS | 0 refills | Status: AC

## 2023-01-09 MED ORDER — POTASSIUM CHLORIDE ER 20 MEQ PO TBCR: 10.0000 meq | EXTENDED_RELEASE_TABLET | Freq: Two times a day (BID) | ORAL | Status: DC

## 2023-01-09 MED ORDER — CALCIUM CARBONATE 1250 (500 CA) MG PO TABS: 1250.0000 mg | ORAL_TABLET | Freq: Two times a day (BID) | ORAL | Status: DC

## 2023-01-09 MED ORDER — FERROUS SULFATE 325 (65 FE) MG PO TABS: 325.0000 mg | ORAL_TABLET | Freq: Two times a day (BID) | ORAL | 3 refills | Status: AC

## 2023-01-09 MED ORDER — LORAZEPAM 0.5 MG PO TABS: 0.5000 mg | ORAL_TABLET | Freq: Four times a day (QID) | ORAL | 0 refills | Status: AC | PRN

## 2023-01-09 MED ORDER — SPIRONOLACTONE 25 MG PO TABS
25.0000 mg | ORAL_TABLET | Freq: Once | ORAL | Status: AC
  Administered 2023-01-09: 25 mg via ORAL
  Filled 2023-01-09: qty 1

## 2023-01-09 MED ORDER — FUROSEMIDE 80 MG PO TABS: 80.0000 mg | ORAL_TABLET | Freq: Two times a day (BID) | ORAL | Status: DC

## 2023-01-09 MED ORDER — FUROSEMIDE 10 MG/ML IJ SOLN
80.0000 mg | Freq: Every day | INTRAMUSCULAR | Status: DC
  Administered 2023-01-09: 80 mg via INTRAVENOUS
  Filled 2023-01-09: qty 8

## 2023-01-09 NOTE — Discharge Summary (Signed)
[name] SKA:[id number] DOB: [birth year] DOA: 12/31/2022  PCP: Isaac Bliss, Rayford Halsted, MD  Admit date: 12/31/2022  Discharge date: 01/09/2023  Admitted From: SNF   Disposition:  SNF   Recommendations for Outpatient Follow-up:   Follow up with PCP in 1-2 weeks  PCP Please obtain BMP/CBC, 2 view CXR in 1week,  (see Discharge instructions)   PCP Please follow up on the following pending results: Monitor blood pressure, BMP and diuretic dose closely.  Needs close outpatient follow-up with nephrology along with vascular surgery and podiatry.   Home Health: None   Equipment/Devices: None  Consultations: VVS, Podiatry Discharge Condition: Stable    CODE STATUS: DNR   Diet Recommendation: Heart Healthy with strict 1.5 L fluid restriction per day    Chief Complaint  Patient presents with   Leg Swelling    Left      Brief history of present illness from the day of admission and additional interim summary    82 y.o.  female with medical history significant of PAD with critical limb ischemia (dry gangrene of left great toe), chronic HFpEF, PAF on Eliquis, CAD (managed medically-Plavix/statin) CKD stage IV, DM-2, Keppra, history of AML (in remission)-sent from PCPs office for evaluation of worsening lower extremity edema/hypoxemia-found to have acute on chronic HFpEF and left leg critical limb ischemia with dry gangrene of left great toe.    Significant events: 12/30-1/18>> hospitalization for left lower limb critical ischemia-s/p angiogram with balloon angioplasty-treated for UTI/hypokalemia/AKI/HFpEF exacerbation.  Discharged home with home health.   1/25>> sent to ED by PCP-for SOB/worsening leg swelling-HFpEF exacerbation, dry gangrene of left great toe.   Significant studies: 1/1>>1, 25 vitamin D:17.2  (low) 1/1>>25 vitamin D:19.58 (low) 1/3>>PTH 307 (high) 1/25>> CXR: Pulmonary vascular congestion, small bilateral pleural effusions 1/25>> lower extremity arterial duplex: 50-74% stenosis left superficial femoral artery, left popliteal artery and left posterior tibial artery  1/26.  ABIs. Duplex and ABIs reviewed appears to have restenosis of the left SFA and will need likely stenting on Monday.      Significant microbiology data: 1/24>> blood culture: No growth 1/25>> COVID/influenza/RSV PCR: Negative   Procedures: 1. Partial first ray amputation left by podiatrist Dr. Amalia Hailey on 01/03/2023   01/05/23 - Dr Standley Dakins VVS   PROCEDURE:   1) Ultrasound guided right access 2) Aortogram 3) Left lower extremity angiogram with third order cannulation (~34m contrast) 4) Left femoropopliteal angioplasty and stenting (6x1560m 6x15028m6x80m79muvia) 5) Conscious sedation (39 minutes)                                                                 Hospital Course    Acute hypoxic respiratory failure 2 acute on chronic diastolic CHF EF 60%.20%e to HFpEF exacerbation Was diuresed aggressively with IV Lasix, hypoxia resolved, at rest  stable on room air Currently stable on room air upon exertion requires 2 L nasal cannula oxygen at times, continue that at SNF on a as needed basis.  Increased home dose Lasix and potassium dose, strict 1.5 L fluid restriction per day.  Monitor BMP, calcium, magnesium levels closely along with diuretic dose at SNF.   Critical limb ischemia-left toe gangrene - seen by both vascular surgery along with podiatry, S/p arteriogram on 1/17 with balloon angioplasty of ostial SFA lesion, proximal SFA lesion, Arterial Doppler on 1/25 Duplex and ABIs reviewed appears to have restenosis of the left SFA, she underwent Partial first ray amputation left on 01/03/2023 by podiatrist Dr. Amalia Hailey, followed by left femoropopliteal angiogram and stenting by Dr. Luan Pulling vascular surgeon on  01/05/2023.  Postprocedure recommendation is to continue the patient on Eliquis, Plavix and statin, agreeable for blood transfusion if needed.  Case discussed with vascular surgery Dr. Luan Pulling okay for discharge with outpatient follow-up with him along with Dr. Amalia Hailey who recommends to discharge the patient with the present dressing, first dressing change in his office within a week of discharge, WBAT heel pressure in a postsurgical shoe, postsurgical shoe room.    Chronic left hip pain with acute postop left foot pain.  Pain medications adjusted.  She is chronically on narcotics.  Currently pain-free monitor narcotic and benzodiazepine use closely.   Hypocalcemia  Recent PTH levels appropriately elevated, vitamin D levels low Just started on calcitriol/Os-Cal yesterday-will increase calcitriol to 0.5 mcg today, change Os-Cal to 3 times daily dosing IV calcium given on 01/03/2023. Replaced IV calcium on 01/04/2023, 01/05/23 & 01/06/23 - stable now when corrected for albumin Kindly monitor calcium levels closely at SNF along with PhosLo and calcium acetate dose.  CKD stage IV, baseline creatinine around 2.2 Close to baseline monitor with diuresis will benefit from outpatient nephrology follow-up.  Request SNF MD to monitor diuretic dose and BMP, magnesium and ionized calcium levels closely.     Normocytic anemia with some perioperative blood loss Due to CKD Follow CBC closely, placed on oral iron and folic acid   PAF Continue metoprolol Eliquis    Moderate protein calorie malnutrition.  Placed on protein supplements at the hospital.     CAD Treated medically-continue Plavix/statin/beta-blocker No current anginal symptoms   HLD Statin   Seizure disorder Keppra   History of AML-in remission Follows with Dr. [name] as an outpatient   DM-2 (A1c 9.9 on 08/13/2022) - stable only on sliding scale here, continue CBGs q. ACH S monitoring at SNF with sliding scale.    Discharge diagnosis      Principal Problem:   Acute on chronic diastolic CHF (congestive heart failure) ([place]) Active Problems:   CHF exacerbation [facility])    Discharge instructions    Discharge Instructions     Discharge instructions   Complete by: As directed    Follow with Primary MD Isaac Bliss, Rayford Halsted, MD in 7 days   Get CBC, CMP, magnesium-  checked next visit with your primary MD or SNF MD   Activity: Left leg WBAT heel pressure in a postsurgical shoe, postsurgical shoe worn at all times when she is out of bed, with Full fall precautions use walker/cane & assistance as needed  Disposition SNF  Diet: Heart Healthy with strict 1.5 L fluid restriction per day.  Check your Weight same time everyday, if you gain over 2 pounds, or you develop in leg swelling, experience more shortness of breath or chest pain, call your Primary  MD immediately. Follow Cardiac Low Salt Diet and 1.5 lit/day fluid restriction.  Check CBGs q. [place].  Special Instructions: If you have smoked or chewed Tobacco  in the last 2 yrs please stop smoking, stop any regular Alcohol  and or any Recreational drug use.  On your next visit with your primary care physician please Get Medicines reviewed and adjusted.  Please request your Prim.MD to go over all Hospital Tests and Procedure/Radiological results at the follow up, please get all Hospital records sent to your Prim MD by signing hospital release before you go home.  If you experience worsening of your admission symptoms, develop shortness of breath, life threatening emergency, suicidal or homicidal thoughts you must seek medical attention immediately by calling 911 or calling your MD immediately  if symptoms less severe.  You Must read complete instructions/literature along with all the possible adverse reactions/side effects for all the Medicines you take and that have been prescribed to you. Take any new Medicines after you have completely understood and accpet all the  possible adverse reactions/side effects.   Discharge wound care:   Complete by: As directed    Left foot dressing clean and dry at all times must follow-up with podiatry within a week of discharge for dressing change.   Increase activity slowly   Complete by: As directed        Discharge Medications   Allergies as of 01/09/2023       Reactions   Chlorhexidine Gluconate Itching, Rash        Medication List     STOP taking these medications    Toujeo Max SoloStar 300 UNIT/ML Solostar Pen Generic drug: insulin glargine (2 Unit Dial)       TAKE these medications    apixaban 2.5 MG Tabs tablet Commonly known as: ELIQUIS Take 1 tablet (2.5 mg total) by mouth 2 (two) times daily.   ascorbic acid 1000 MG tablet Commonly known as: VITAMIN C Take 1 tablet (1,000 mg total) by mouth daily.   atorvastatin 40 MG tablet Commonly known as: LIPITOR Take 1 tablet (40 mg total) by mouth daily.   calcium acetate 667 MG capsule Commonly known as: PHOSLO Take 2 capsules (1,334 mg total) by mouth 3 (three) times daily with meals.   calcium carbonate 1250 (500 Ca) MG tablet Commonly known as: OS-CAL - dosed in mg of elemental calcium Take 1 tablet (1,250 mg total) by mouth 2 (two) times daily with a meal.   clopidogrel 75 MG tablet Commonly known as: PLAVIX Take 1 tablet (75 mg total) by mouth every evening.   cyanocobalamin 1000 MCG/ML injection Commonly known as: VITAMIN B12 INJECT 1ML IN THE MUSCLE EVERY 30 DAYS AS DIRECTED What changed: See the new instructions.   docusate sodium 100 MG capsule Commonly known as: COLACE Take 1 capsule (100 mg total) by mouth 2 (two) times daily.   ferrous sulfate 325 (65 FE) MG tablet Take 1 tablet (325 mg total) by mouth 2 (two) times daily with a meal.   fluticasone furoate-vilanterol 200-25 MCG/ACT Aepb Commonly known as: BREO ELLIPTA Inhale 1 puff into the lungs as needed (wheezing SOB).   folic acid 1 MG tablet Commonly known  as: FOLVITE Take 1 tablet (1 mg total) by mouth daily. Start taking on: January 10, 2023   furosemide 80 MG tablet Commonly known as: LASIX Take 1 tablet (80 mg total) by mouth 2 (two) times daily. What changed:  medication strength how much to take when  to take this   insulin aspart 100 UNIT/ML FlexPen Commonly known as: NOVOLOG Before each meal 3 times a day, 140-199 - 2 units, 200-250 - 4 units, 251-299 - 6 units,  300-349 - 8 units,  350 or above 10 units. Insulin PEN if approved, provide syringes and needles if needed.Please switch to any approved short acting Insulin if needed.   isosorbide mononitrate 30 MG 24 hr tablet Commonly known as: IMDUR Take 1 tablet (30 mg total) by mouth daily.   letrozole 2.5 MG tablet Commonly known as: FEMARA Take 1 tablet (2.5 mg total) by mouth daily.   levETIRAcetam 500 MG tablet Commonly known as: KEPPRA TAKE 1 TABLET(500 MG) BY MOUTH TWICE DAILY What changed: See the new instructions.   LORazepam 0.5 MG tablet Commonly known as: ATIVAN Take 1 tablet (0.5 mg total) by mouth every 6 (six) hours as needed for anxiety.   melatonin 3 MG Tabs tablet Take 1 tablet (3 mg total) by mouth at bedtime. What changed:  when to take this reasons to take this   methocarbamol 500 MG tablet Commonly known as: ROBAXIN Take 1 tablet (500 mg total) by mouth every 4 (four) hours as needed for muscle spasms.   metoprolol succinate 25 MG 24 hr tablet Commonly known as: TOPROL-XL Take 0.5 tablets (12.5 mg total) by mouth daily. Take with or immediately following a meal.   multivitamin with minerals Tabs tablet Take 1 tablet by mouth daily.   nitroGLYCERIN 0.4 MG SL tablet Commonly known as: NITROSTAT Place 0.4 mg under the tongue every 5 (five) minutes as needed for chest pain.   oxyCODONE-acetaminophen 5-325 MG tablet Commonly known as: PERCOCET/ROXICET Take 1-2 tablets by mouth every 6 (six) hours as needed for severe pain.   polyethylene  glycol 17 g packet Commonly known as: MIRALAX / GLYCOLAX Take 17 g by mouth daily.   Potassium Chloride ER 20 MEQ Tbcr Take 0.5 tablets (10 mEq total) by mouth 2 (two) times daily. What changed:  medication strength when to take this   psyllium 95 % Pack Commonly known as: HYDROCIL/METAMUCIL Take 1 packet by mouth daily.   Vitamin D (Ergocalciferol) 1.25 MG (50000 UNIT) Caps capsule Commonly known as: DRISDOL Take 1 capsule (50,000 Units total) by mouth every 7 (seven) days.               Discharge Care Instructions  (From admission, onward)           Start     Ordered   01/09/23 0000  Discharge wound care:       Comments: Left foot dressing clean and dry at all times must follow-up with podiatry within a week of discharge for dressing change.   01/09/23 0865             Follow-up Information     Cherre Robins, MD Follow up in 4 week(s).   Specialties: Vascular Surgery, Interventional Cardiology Why: Office will call you to arrange your appt (sent) Contact information: [place] [address] [phone]         Isaac Bliss, Rayford Halsted, MD. Schedule an appointment as soon as possible for a visit in 1 week(s).   Specialty: Internal Medicine Contact information: [place] [address] [phone]         Edrick Kins, DPM. Schedule an appointment as soon as possible for a visit in 5 day(s).   Specialty: Health visitor information: [address]  [place] [address] [phone]                 Major procedures and Radiology Reports - PLEASE review detailed and final reports thoroughly  -      PERIPHERAL VASCULAR CATHETERIZATION  Result Date: 01/05/2023 DATE OF SERVICE: 01/05/2023  PATIENT:  [name]  82 y.o. female  PRE-OPERATIVE DIAGNOSIS:  Atherosclerosis of native arteries of left lower extremity causing gangrene  POST-OPERATIVE DIAGNOSIS:  Same  PROCEDURE:  1)  Ultrasound guided right access 2) Aortogram 3) Left lower extremity angiogram with third order cannulation (~76m contrast) 4) Left femoropopliteal angioplasty and stenting (6x1539m 6x15067m6x80m63muvia) 5) Conscious sedation (39 minutes)   SURGEON:  ThomYevonne AlinewkStanford Breed  ASSISTANT: none  ANESTHESIA:   local and IV sedation  ESTIMATED BLOOD LOSS: minimal  LOCAL MEDICATIONS USED:  LIDOCAINE  COUNTS: confirmed correct.  PATIENT DISPOSITION:  PACU - hemodynamically stable.  Delay start of Pharmacological VTE agent (>24hrs) due to surgical blood loss or risk of bleeding: no  INDICATION FOR PROCEDURE: Catherine GORSLINEa 82 y98. female with left great toe gangrene. After careful discussion of risks, benefits, and alternatives the patient was offered angiography with intervention. The patient understood and wished to proceed.  OPERATIVE FINDINGS: Terminal aorta and iliac arteries: Widely patent  Left lower extremity: Common femoral artery: Diffuse disease. widely patent Profunda femoris artery: Diffuse disease. widely patent Superficial femoral artery: Diffuse disease. Restenosis of proximal, mid and distal SFA ~70% greatest area of stenosis. Popliteal artery: Diffuse disease. Above knee popliteal with focal stenosis ~70%. No significant stenosis behind or below the knee. Anterior tibial artery: patent at its origin, tapers to occlusion in calf Tibioperoneal trunk: patent Peroneal artery: patent at its origin, tapers in mid calf Posterior tibial artery: dominant tibial, courses to plantar arch filling the foot Pedal circulation: fills via PT  GLASS score. FP 4. IP 0. Stage III.  WIfI score. 2 / 3 / 1. Stage IV.  DESCRIPTION OF PROCEDURE: After identification of the patient in the pre-operative holding area, the patient was transferred to the operating room. The patient was positioned supine on the operating room table.  Anesthesia was induced. The groins was prepped and draped in standard fashion. A surgical pause was  performed confirming correct patient, procedure, and operative location.  The right groin was anesthetized with subcutaneous injection of 1% lidocaine. Using ultrasound guidance, the right common femoral artery was accessed with micropuncture technique. Fluoroscopy was used to confirm cannulation over the femoral head. The 71F sheath was upsized to 79F.  A Benson wire was advanced into the distal aorta. Over the wire an omni flush catheter was advanced to the level of L2. Aortogram was performed - see above for details.  The left common iliac artery was selected with an omniflush catheter and glidewire advantage guidewire. The wire was advanced into the common femoral artery. Over the wire the omni flush catheter was advanced into the external iliac artery. Selective angiography was performed - see above for details.  The decision was made to intervene. The patient was heparinized with 8,000 units of heparin. The 79F sheath was exchanged for a 31F x 45cm sheath. Selective angiography of the left lower extremity was performed prior to intervention.  The lesions were treated with: Femoropopliteal angioplasty and stenting (6x150mm75m150mm,32m0mm E39ma)  Completion angiography revealed: Resolution of SFA/Pop disease  The sheath was left in place to be removed in the recovery area.  Conscious sedation  was administered with the use of IV fentanyl and midazolam under continuous physician and nurse monitoring.  Heart rate, blood pressure, and oxygen saturation were continuously monitored.  Total sedation time was 39 minutes  Upon completion of the case instrument and sharps counts were confirmed correct. The patient was transferred to the  PACU in good condition. I was present for all portions of the procedure.  PLAN: Plavix '75mg'$  PO QD. Eliquis 2.'5mg'$  PO BID. High intensity statin therapy. Optimized from vascular standpoint. Follow up in 4 weeks with ABI and Duplex.  Yevonne Aline. Stanford Breed, MD [facility] Phone Number: [phone] 01/05/2023 10:56 AM    DG HIP UNILAT W OR W/O PELVIS MIN 4 VIEWS LEFT  Result Date: 01/04/2023 CLINICAL DATA:  Left hip pain.  Leg swelling. EXAM: DG HIP (WITH OR WITHOUT PELVIS) LEFT three views COMPARISON:  Left hip radiographs 12/10/2022 FINDINGS: Moderate osteopenia again noted. Vascular calcifications are present. Left hip is located. No acute or healing fractures are present. Right hip ORIF stable. IMPRESSION: 1. No acute or healing fracture. 2. Moderate osteopenia. 3. Vascular calcifications. Electronically Signed   By: San Morelle M.D.   On: 01/04/2023 11:26   DG Foot Complete Left  Result Date: 01/03/2023 CLINICAL DATA:  Postoperative. EXAM: LEFT FOOT - COMPLETE 3+ VIEW COMPARISON:  Preoperative radiograph 12/31/2022 FINDINGS: Transmetatarsal amputation of the first ray with resection of the first toe. Expected postsurgical change in the soft tissues. No evidence of acute fracture. Plantar calcaneal spur and Achilles tendon enthesophyte. IMPRESSION: Transmetatarsal amputation of the first ray with resection of the first toe. No immediate postoperative complication. Electronically Signed   By: Keith Rake M.D.   On: 01/03/2023 18:39   VAS [place] LOWER EXTREMITY ARTERIAL DUPLEX  Result Date: 01/02/2023 LOWER EXTREMITY ARTERIAL DUPLEX STUDY Patient Name:  [name]  Date of Exam:   01/01/2023 Medical Rec #: [id number]      Accession #:    [id number] Date of Birth: 09-20-1941      Patient Gender: F Patient Age:   19 years Exam Location:  [facility] Procedure:      VAS [place] LOWER EXTREMITY ARTERIAL DUPLEX Referring Phys: EMMA COLLINS --------------------------------------------------------------------------------  Indications: Gangrene. High Risk Factors: Hypertension, hyperlipidemia, Diabetes, past history of                    smoking, prior MI, coronary artery disease. Other Factors: CHF, CKD, PAD.  Vascular Interventions: 12/24/2022  LLE balloon angioplasty of SFA. Current ABI:            Non-compressible bilaterally Performing Technologist: Rogelia Rohrer RVT, RDMS Supporting Technologist: Darlin Coco RDMS, RVT  Examination Guidelines: A complete evaluation includes B-mode imaging, spectral Doppler, color Doppler, and power Doppler as needed of all accessible portions of each vessel. Bilateral testing is considered an integral part of a complete examination. Limited examinations for reoccurring indications may be performed as noted.   +-----------+--------+-----+--------------+------------------+-----------------+ LEFT       PSV cm/sRatioStenosis      Waveform          Comments          +-----------+--------+-----+--------------+------------------+-----------------+ CFA Mid    148                        biphasic          Turbulent         +-----------+--------+-----+--------------+------------------+-----------------+ DFA  71                         monophasic                          +-----------+--------+-----+--------------+------------------+-----------------+ SFA Prox   170     2:1  50-74%        monophasic                                                  stenosis                                          +-----------+--------+-----+--------------+------------------+-----------------+ SFA Mid    80>157  2:1  50-74%        monophasic        Focal elevation                           stenosis                        of 157            +-----------+--------+-----+--------------+------------------+-----------------+ SFA Distal 54                                                             +-----------+--------+-----+--------------+------------------+-----------------+ POP Prox   111                        monophasic                          +-----------+--------+-----+--------------+------------------+-----------------+ POP Mid    274     2:1  50-74%        monophasic                                                   stenosis                                          +-----------+--------+-----+--------------+------------------+-----------------+ POP Distal 79                         monophasic                          +-----------+--------+-----+--------------+------------------+-----------------+ TP Trunk   65                         monophasic                          +-----------+--------+-----+--------------+------------------+-----------------+ ATA Prox  44                         monophasic                          +-----------+--------+-----+--------------+------------------+-----------------+ ATA Mid    11                         dampened                                                                  monophasic                          +-----------+--------+-----+--------------+------------------+-----------------+ ATA Distal 28                         monophasic                          +-----------+--------+-----+--------------+------------------+-----------------+ PTA Prox   39                         monophasic                          +-----------+--------+-----+--------------+------------------+-----------------+ PTA Mid    90      2:1  50-74%        monophasic                                                  stenosis                                          +-----------+--------+-----+--------------+------------------+-----------------+ PTA Distal 70                         monophasic                          +-----------+--------+-----+--------------+------------------+-----------------+ PERO Prox  32                         monophasic                          +-----------+--------+-----+--------------+------------------+-----------------+ PERO Mid   32                         monophasic                           +-----------+--------+-----+--------------+------------------+-----------------+ PERO Distal28                         dampened  monophasic                          +-----------+--------+-----+--------------+------------------+-----------------+  Summary: Left: Multi-focal 50-74% stenosis noted in the superficial femoral artery. 50-74% stenosis noted in the popliteal artery. 50-74% stenosis noted in the posterior tibial artery. Bilateral: Abnormal common femoral waveforms bilaterally suggestive of inflow disease.  See table(s) above for measurements and observations. Electronically signed by Servando Snare MD on 01/02/2023 at 1:49:40 PM.    Final    VAS [place] ABI WITH/WO TBI  Result Date: 01/02/2023  LOWER EXTREMITY DOPPLER STUDY Patient Name:  [name]  Date of Exam:   01/01/2023 Medical Rec #: [id number]      Accession #:    [id number] Date of Birth: 1941-07-13      Patient Gender: F Patient Age:   11 years Exam Location:  [facility] Procedure:      VAS [place] ABI WITH/WO TBI Referring Phys: EMMA COLLINS --------------------------------------------------------------------------------  Indications: Gangrene. High Risk Factors: Hypertension, hyperlipidemia, Diabetes, past history of                    smoking, prior MI, coronary artery disease. Other Factors: CHF, CKD, PAD.  Vascular Interventions: 12/24/2022 LLE balloon angioplastry of SFA. Comparison Study: Previous exam 12/11/2022 Performing Technologist: Rogelia Rohrer RVT, RDMS Supporting Technologist: Darlin Coco RDMS, RVT  Examination Guidelines: A complete evaluation includes at minimum, Doppler waveform signals and systolic blood pressure reading at the level of bilateral brachial, anterior tibial, and posterior tibial arteries, when vessel segments are accessible. Bilateral testing is considered an integral part of a complete examination. Photoelectric Plethysmograph (PPG)  waveforms and toe systolic pressure readings are included as required and additional duplex testing as needed. Limited examinations for reoccurring indications may be performed as noted.  ABI Findings: +---------+------------------+-----+----------+--------+ Right    Rt Pressure (mmHg)IndexWaveform  Comment  +---------+------------------+-----+----------+--------+ Brachial 123                    triphasic          +---------+------------------+-----+----------+--------+ PTA                             monophasic         +---------+------------------+-----+----------+--------+ DP                              monophasic         +---------+------------------+-----+----------+--------+ Great Toe64                0.50 Abnormal           +---------+------------------+-----+----------+--------+ +---------+------------------+-----+----------+-------+ Left     Lt Pressure (mmHg)IndexWaveform  Comment +---------+------------------+-----+----------+-------+ Brachial 129                    triphasic         +---------+------------------+-----+----------+-------+ PTA                             monophasic        +---------+------------------+-----+----------+-------+ DP                              monophasic        +---------+------------------+-----+----------+-------+ Rella Larve  0.00 Absent            +---------+------------------+-----+----------+-------+ +-------+-----------+-----------+------------+------------+ ABI/TBIToday's ABIToday's TBIPrevious ABIPrevious TBI +-------+-----------+-----------+------------+------------+ Right  [place]         0.50       [place]          0.26         +-------+-----------+-----------+------------+------------+ Left   [place]         Absent     0.95                     +-------+-----------+-----------+------------+------------+  Summary: Right: Resting right ankle-brachial index indicates noncompressible right  lower extremity arteries. The right toe-brachial index is abnormal. Left: Resting left ankle-brachial index indicates noncompressible left lower extremity arteries. The left toe-brachial index is absent. *See table(s) above for measurements and observations.  Electronically signed by Servando Snare MD on 01/02/2023 at 1:49:25 PM.    Final    DG Chest Portable 1 View  Result Date: 01/01/2023 CLINICAL DATA:  Shortness of breath and leg swelling. EXAM: PORTABLE CHEST 1 VIEW COMPARISON:  December 24, 2022 FINDINGS: There is stable right-sided venous Port-A-Cath positioning. The cardiac silhouette is mildly enlarged and unchanged in size. There is marked severity calcification of the aortic arch. Mild prominence of the bilateral perihilar pulmonary vasculature is noted. Mild atelectasis is seen within the bilateral lung bases. Small bilateral pleural effusions are seen. No pneumothorax is identified. Radiopaque surgical clips are seen in the soft tissues of the left breast. The visualized skeletal structures are unremarkable. IMPRESSION: 1. Stable cardiomegaly with mild pulmonary vascular congestion. 2. Mild bibasilar atelectasis and small bilateral pleural effusions. Electronically Signed   By: Virgina Norfolk M.D.   On: 01/01/2023 01:25   DG Foot Complete Left  Result Date: 12/31/2022 CLINICAL DATA:  Worsening left foot wounds. EXAM: LEFT FOOT - COMPLETE 3+ VIEW COMPARISON:  None Available. FINDINGS: There is no evidence of an acute fracture or dislocation. Diffuse osteopenia is noted without areas of acute cortical destruction. There is a moderate to large plantar calcaneal spur with mild degenerative changes seen within the mid left foot. Moderate severity diffuse soft tissue swelling is seen, more prominent along the dorsal aspect of the left foot. IMPRESSION: Moderate severity diffuse soft tissue swelling without evidence of acute osteomyelitis. MRI correlation is recommended if acute osteomyelitis remains of  clinical concern. Electronically Signed   By: Virgina Norfolk M.D.   On: 12/31/2022 19:16   PERIPHERAL VASCULAR CATHETERIZATION  Result Date: 12/24/2022 Images from the original result were not included. Patient name: [name] MRN: [id number] DOB: 10/17/1941 Sex: female 12/24/2022 Pre-operative Diagnosis: Left lower extremity critical limb ischemia with tissue loss Post-operative diagnosis:  Same Surgeon:  Broadus John, MD Procedure Performed: 1.  Ultrasound-guided micropuncture access of the right common femoral artery 2.  Aortogram 3.  Second-order cannulation, left lower extremity angiogram 4.  Drug-coated balloon angioplasty of the superficial femoral artery 5 x 40 mm 5.  Balloon angioplasty of the superficial femoral artery 4 x 40 mm 6.  Moderate sedation time 29 minutes 7.  Contrast volume 70 mL 8.  Manual pressure for manual pressure Indications: Patient is an 82 year old female who presented to hospital with wounds on the left great toe.  ABIs demonstrated monophasic waveforms with nonpalpable pulses.  She had significant edema.  After discussing the risk and benefits of bilateral lower extremity angiogram with emphasis on the left foot, [name] elected to proceed. Findings: Severe atherosclerotic disease throughout the lower  extremities Aortogram: Bilateral renal arteries patent, no flow-limiting stenosis in the aortoiliac segments bilaterally. On the left: Patent common femoral artery, patent profunda.  70% stenosis at the ostia of the superficial femoral artery with a 3 cm 70% stenotic lesion in the proximal superficial femoral artery.  Diffuse atherosclerotic disease throughout the superficial femoral artery.  The popliteal artery is widely patent, two-vessel, posterior tibial, peroneal runoff.  The posterior tibial artery continues into the foot via plantar arteries. On the right: Patent common femoral artery, patent profunda.  60% ostial stenosis at the superficial femoral artery.  Diffuse  atherosclerotic disease throughout the superficial femoral artery.  The popliteal artery is widely patent, two-vessel runoff posterior tibial, peroneal artery.  The posterior tibial artery continues into the foot via the plantar arteries.  Procedure:  The patient was identified in the holding area and taken to room 8.  The patient was then placed supine on the table and prepped and draped in the usual sterile fashion.  A time out was called.  Ultrasound was used to evaluate the right common femoral artery.  It was patent .  A digital ultrasound image was acquired.  A micropuncture needle was used to access the right common femoral artery under ultrasound guidance.  An 018 wire was advanced without resistance and a micropuncture sheath was placed.  The 018 wire was removed and a benson wire was placed.  The micropuncture sheath was exchanged for a 5 french sheath.  An omniflush catheter was advanced over the wire to the level of L-1.  An abdominal angiogram was obtained.  Next, using the omniflush catheter and a benson wire, the aortic bifurcation was crossed and the catheter was placed into theleft external iliac artery and left runoff was obtained.  right runoff was performed via retrograde sheath injections. I elected to attempt intervention on the left SFA ostial lesion, proximal SFA lesion.  A 6 x 45 cm sheath was brought to the field and parked in the left common femoral artery.  Next, a series of wires and catheters were used to traverse the superficial femoral artery.  Next, a 4 x 40 mm angioplasty balloon was brought to the field and placed across the ostium of the superficial femoral artery.  This was held for 3 minutes.  Following this, the balloon was used on the proximal superficial femoral artery.  Again, the balloon was inflated for 3 minutes. Follow-up angiography demonstrated significant improvement in the proximal SFA.  There appeared to be central stenosis at the ostia of the SFA.  A 5 x 40  drug-coated balloon was brought to the field and inflated for 3 minutes.  Follow-up angiography demonstrated excellent result with resolution of flow-limiting stenosis. Impression: Successful balloon angioplasty of ostial SFA lesion, proximal SFA lesion.  Two-vessel runoff to the level of the ankle with the posterior tibial artery tingling into the foot. Patient's left lower extremity blood flow has been optimized. Right leg with nearly-symmetric disease, no intervention.  No wound on the side.  No plan for intervention at this time. Cassandria Santee, MD [facility] Office: [phone]   DG CHEST PORT 1 VIEW  Result Date: 12/24/2022 CLINICAL DATA:  Dyspnea, shortness of breath this morning EXAM: PORTABLE CHEST 1 VIEW COMPARISON:  Portable exam 1124 hours compared to 12/10/2022 FINDINGS: RIGHT jugular Port-A-Cath with tip projecting over SVC. Stable heart size and mediastinal contours. Slight pulmonary vascular congestion. Atherosclerotic calcification aorta. Persistent RIGHT pleural effusion and basilar atelectasis. Upper lungs  clear. No pneumothorax or acute osseous findings. IMPRESSION: Persistent RIGHT pleural effusion and basilar atelectasis. Aortic Atherosclerosis (ICD10-I70.0). Electronically Signed   By: Lavonia Dana M.D.   On: 12/24/2022 12:05   VAS [place] ABI WITH/WO TBI  Result Date: 12/11/2022  LOWER EXTREMITY DOPPLER STUDY Patient Name:  [name]  Date of Exam:   12/11/2022 Medical Rec #: [id number]      Accession #:    [id number] Date of Birth: 05/12/1941      Patient Gender: F Patient Age:   65 years Exam Location:  [facility] Procedure:      VAS [place] ABI WITH/WO TBI Referring Phys: Vance Gather --------------------------------------------------------------------------------  Indications: Rest pain, and ulceration. High Risk Factors: Hypertension, hyperlipidemia, Diabetes.  Comparison Study: No prior studies. Performing Technologist: Carlos Levering RVT  Examination  Guidelines: A complete evaluation includes at minimum, Doppler waveform signals and systolic blood pressure reading at the level of bilateral brachial, anterior tibial, and posterior tibial arteries, when vessel segments are accessible. Bilateral testing is considered an integral part of a complete examination. Photoelectric Plethysmograph (PPG) waveforms and toe systolic pressure readings are included as required and additional duplex testing as needed. Limited examinations for reoccurring indications may be performed as noted.  ABI Findings: +---------+------------------+-----+----------+--------+ Right    Rt Pressure (mmHg)IndexWaveform  Comment  +---------+------------------+-----+----------+--------+ Brachial 152                    triphasic          +---------+------------------+-----+----------+--------+ PTA      253               1.66 monophasic         +---------+------------------+-----+----------+--------+ DP       254               1.67 monophasic         +---------+------------------+-----+----------+--------+ Great Toe39                0.26                    +---------+------------------+-----+----------+--------+ +--------+------------------+-----+-----------+-------+ Left    Lt Pressure (mmHg)IndexWaveform   Comment +--------+------------------+-----+-----------+-------+ [id number]                    triphasic          +--------+------------------+-----+-----------+-------+ PTA     145               0.95 multiphasic        +--------+------------------+-----+-----------+-------+ DP      91                0.60 monophasic         +--------+------------------+-----+-----------+-------+ +-------+-----------+-----------+------------+------------+ ABI/TBIToday's ABIToday's TBIPrevious ABIPrevious TBI +-------+-----------+-----------+------------+------------+ Right  [place]         0.26                                 +-------+-----------+-----------+------------+------------+ Left   0.95                                           +-------+-----------+-----------+------------+------------+  Summary: Right: Resting right ankle-brachial index indicates noncompressible right lower extremity arteries. The right toe-brachial index is abnormal. Left: Resting left ankle-brachial index is within normal range. Unable to obtain  TBI due to great toe wound, and absent waveforms. *See table(s) above for measurements and observations.  Electronically signed by Monica Martinez MD on 12/11/2022 at 3:27:26 PM.    Final    DG CHEST PORT 1 VIEW  Result Date: 12/10/2022 CLINICAL DATA:  Dyspnea EXAM: PORTABLE CHEST 1 VIEW COMPARISON:  12/06/2022 FINDINGS: Cardiac shadow is enlarged but stable. Aortic calcifications are noted. Right chest wall port is seen. Increased vascular congestion is noted with small right effusion. Patchy airspace opacity in the right base is noted as well. No bony abnormality is seen. IMPRESSION: Mild vascular congestion. Right effusion and associated airspace opacity in the right base. Electronically Signed   By: Inez Catalina M.D.   On: 12/10/2022 22:37   DG Knee 1-2 Views Left  Result Date: 12/10/2022 CLINICAL DATA:  Fall a couple weeks ago.  Lower leg pain. EXAM: LEFT KNEE - 1-2 VIEW COMPARISON:  Right femur and right tibia and fibula radiographs 08/29/2012 FINDINGS: There is diffuse decreased bone mineralization. Minimal medial compartment joint space narrowing. Moderate to severe patellofemoral joint space narrowing. Mild-to-moderate chronic enthesopathic changes at the quadriceps and patellar tendon insertions on the patella. No significant knee joint effusion is seen. No definite acute fracture is seen. No dislocation. Moderate to high-grade vascular calcifications. IMPRESSION: 1. Moderate patellofemoral osteoarthritis. 2. No acute fracture is seen. Electronically Signed   By: Yvonne Kendall M.D.   On:  12/10/2022 13:05   DG Foot Complete Left  Result Date: 12/10/2022 CLINICAL DATA:  [place] [employer] go.  Left foot and toe pain. EXAM: LEFT FOOT - COMPLETE 3+ VIEW COMPARISON:  None available FINDINGS: There is diffuse decreased bone mineralization. Moderate to large plantar calcaneal heel spur. Mild second and third tarsometatarsal joint space narrowing. No definite acute fracture is seen. No dislocation. Mild hallux valgus. IMPRESSION: 1. Moderate to large plantar calcaneal heel spur. 2. Mild hallux valgus. 3. Within the limitations of diffuse decreased bone mineralization, no definite acute fracture is seen. Electronically Signed   By: Yvonne Kendall M.D.   On: 12/10/2022 13:03   DG HIP UNILAT WITH PELVIS 2-3 VIEWS LEFT  Result Date: 12/10/2022 CLINICAL DATA:  Fall a couple weeks ago. Follow-up. Pain in left hip and left lower leg. EXAM: DG HIP (WITH OR WITHOUT PELVIS) 2-3V LEFT COMPARISON:  AP pelvis 12/06/2022; CT right hip 08/29/2022 FINDINGS: There is again diffuse decreased bone mineralization. Partial visualization of cephalomedullary nail fixating the right intertrochanteric fracture seen on 08/29/2022 CT. Moderate bilateral femoroacetabular joint space narrowing. No definite acute fracture is seen within the left hip. Vascular phleboliths overlie the pelvis. Moderate to high-grade atherosclerotic calcifications. IMPRESSION: No definite acute fracture. Electronically Signed   By: Yvonne Kendall M.D.   On: 12/10/2022 12:59    Today   Subjective    [name] today has no headache,no chest abdominal pain,no new weakness tingling or numbness, feels much better wants to go to SNF today.    Objective   Blood pressure (!) 144/52, pulse 81, temperature 97.8 F (36.6 C), temperature source Oral, resp. rate 17, height '5\' 2"'$  (1.575 m), weight 70.7 kg, SpO2 94 %.   Intake/Output Summary (Last 24 hours) at 01/09/2023 3151 Last data filed at 01/08/2023 2018 Gross per 24 hour  Intake --  Output 650 ml   Net -650 ml    Exam  Awake Alert, No new F.N deficits,    [place].AT,PERRAL Supple Neck,   Symmetrical Chest wall movement, Good air movement bilaterally, CTAB RRR,No Gallops,   +  ve B.Sounds, Abd Soft, Non tender,  Left foot under bandage, still has 1+ edema   Data Review   Recent Labs  Lab 01/05/23 0031 01/06/23 0635 01/07/23 0500 01/08/23 0506 01/09/23 0400  WBC 5.1 4.6 5.2 5.1 5.7  HGB 8.3* 7.6* 7.9* 7.7* 7.8*  HCT 25.7* 24.4* 25.1* 23.8* 24.9*  PLT 92* 93* 101* 113* 124*  MCV 97.0 98.8 98.4 97.1 99.2  MCH 31.3 30.8 31.0 31.4 31.1  MCHC 32.3 31.1 31.5 32.4 31.3  RDW 16.8* 16.3* 16.5* 16.5* 16.7*  LYMPHSABS  --   --   --  0.9 1.2  MONOABS  --   --   --  0.5 0.6  EOSABS  --   --   --  0.2 0.2  BASOSABS  --   --   --  0.0 0.0    Recent Labs  Lab 01/05/23 0031 01/06/23 0635 01/07/23 0500 01/07/23 0501 01/08/23 0506 01/09/23 0400  NA 138 140 140  --  138 140  K 5.1 4.1 3.8  --  4.1 4.1  CL 102 104 103  --  102 105  CO2 '26 27 28  '$ --  28 28  ANIONGAP '10 9 9  '$ --  8 7  GLUCOSE 274* 180* 71  --  146* 54*  BUN 75* 69* 69*  --  72* 74*  CREATININE 2.29* 2.25* 2.52*  --  2.22* 2.56*  AST '21 24 17  '$ --  23 18  ALT '11 11 9  '$ --  9 9  ALKPHOS 113 108 97  --  101 104  BILITOT 0.4 0.6 0.4  --  0.5 0.5  ALBUMIN 2.1* 1.8* 1.9*  --  1.8* 1.8*  BNP 680.6* 928.5*  --  568.5* 509.9* 487.4*  MG 1.9 1.6*  --  2.3 2.0 2.2  CALCIUM 7.3* 7.7* 7.8*  --  7.6* 7.7*     Total Time in preparing paper work, data evaluation and todays exam - 35 minutes  Signature  -    Lala Lund M.D on 01/09/2023 at 9:37 AM   -  To page go to [email or web address]

## 2023-01-09 NOTE — Progress Notes (Signed)
PT Cancellation Note  Patient Details Name: [name] MRN: [id number] DOB: 1941/06/01   Cancelled Treatment:    Reason Eval/Treat Not Completed: (P) Patient declined, no reason specified;Other (comment) (pt report constipation and fatigue after trying to go) PT will follow back this afternoon as able, however pt scheduled to discharge to [facility] today.  [name] B. Migdalia Dk PT, DPT Acute Rehabilitation Services Please use secure chat or  Call Office [phone]    [place] 01/09/2023, 10:52 AM

## 2023-01-09 NOTE — Care Management Important Message (Signed)
Important Message  Patient Details  Name: [name] MRN: [id number] Date of Birth: May 05, 1941   Medicare Important Message Given:  Yes     [name] 01/09/2023, 10:45 AM

## 2023-01-09 NOTE — TOC Transition Note (Addendum)
Transition of Care [facility]) - CM/SW Discharge Note   Patient Details  Name: [name] MRN: [id number] Date of Birth: 1941-05-06  [facility] CM/SW Contact:  Milas Gain, [place] Phone Number: 01/09/2023, 10:37 AM   Clinical Narrative:     CSW spoke with [facility] who confirmed patient can dc over today if medically ready.CSW informed MD.  Patient will DC to: [facility]   Anticipated DC date: 01/09/2023  Family notified: [name]   Transport by: Corey Harold  ?  Per MD patient ready for DC to [facility] . RN, patient, patient's family, and facility notified of DC. Discharge Summary sent to facility. RN given number for report tele# [phone] RM# [phone]. DC packet on chart. DNR signed by MD attached to patients DC packet. Ambulance transport requested for patient.  CSW signing off.    Final next level of care: Skilled Nursing Facility Barriers to Discharge: No Barriers Identified   Patient Goals and CMS Choice CMS Medicare.gov Compare Post Acute Care list provided to:: Patient Represenative (must comment) (Patients spouse [name]) Choice offered to / list presented to : Spouse  Discharge Placement                Patient chooses bed at: [facility] Patient to be transferred to facility by: [place] Name of family member notified: [name] Patient and family notified of of transfer: 01/09/23  Discharge Plan and Services Additional resources added to the After Visit Summary for   In-house Referral: Clinical Social Work                                   Social Determinants of Health ([place]) Interventions SDOH Screenings   Food Insecurity: No Food Insecurity (01/07/2023)  Housing: Low Risk  (01/07/2023)  Transportation Needs: No Transportation Needs (01/07/2023)  Utilities: Not At Risk (01/07/2023)  Alcohol Screen: Low Risk  (10/22/2022)  Depression (PHQ2-9): Low Risk  (10/03/2022)  Financial Resource Strain: Low  Risk  (10/22/2022)  Stress: No Stress Concern Present (05/12/2022)  Tobacco Use: Medium Risk (01/06/2023)     Readmission Risk Interventions    12/09/2022    4:35 PM  Readmission Risk Prevention Plan  Transportation Screening Complete  Medication Review Press photographer) Complete  HRI or [place] Complete  SW Recovery Care/Counseling Consult Complete  Palliative Care Screening Not [place] Not Applicable

## 2023-01-09 NOTE — Discharge Instructions (Addendum)
Follow with Primary MD Isaac Bliss, Rayford Halsted, MD in 7 days   Get CBC, CMP, magnesium-  checked next visit with your primary MD or SNF MD   Activity: Left leg WBAT heel pressure in a postsurgical shoe, postsurgical shoe worn at all times when she is out of bed, with Full fall precautions use walker/cane & assistance as needed  Disposition SNF  Diet: Heart Healthy with strict 1.5 L fluid restriction per day.  Check your Weight same time everyday, if you gain over 2 pounds, or you develop in leg swelling, experience more shortness of breath or chest pain, call your Primary MD immediately. Follow Cardiac Low Salt Diet and 1.5 lit/day fluid restriction.  Special Instructions: If you have smoked or chewed Tobacco  in the last 2 yrs please stop smoking, stop any regular Alcohol  and or any Recreational drug use.  On your next visit with your primary care physician please Get Medicines reviewed and adjusted.  Please request your Prim.MD to go over all Hospital Tests and Procedure/Radiological results at the follow up, please get all Hospital records sent to your Prim MD by signing hospital release before you go home.  If you experience worsening of your admission symptoms, develop shortness of breath, life threatening emergency, suicidal or homicidal thoughts you must seek medical attention immediately by calling 911 or calling your MD immediately  if symptoms less severe.  You Must read complete instructions/literature along with all the possible adverse reactions/side effects for all the Medicines you take and that have been prescribed to you. Take any new Medicines after you have completely understood and accpet all the possible adverse reactions/side effects.

## 2023-01-10 DIAGNOSIS — I10 Essential (primary) hypertension: Secondary | ICD-10-CM | POA: Diagnosis not present

## 2023-01-10 DIAGNOSIS — I70262 Atherosclerosis of native arteries of extremities with gangrene, left leg: Secondary | ICD-10-CM | POA: Diagnosis not present

## 2023-01-10 DIAGNOSIS — R54 Age-related physical debility: Secondary | ICD-10-CM | POA: Diagnosis not present

## 2023-01-10 DIAGNOSIS — I480 Paroxysmal atrial fibrillation: Secondary | ICD-10-CM | POA: Diagnosis not present

## 2023-01-13 DIAGNOSIS — I480 Paroxysmal atrial fibrillation: Secondary | ICD-10-CM | POA: Diagnosis not present

## 2023-01-13 DIAGNOSIS — I70262 Atherosclerosis of native arteries of extremities with gangrene, left leg: Secondary | ICD-10-CM | POA: Diagnosis not present

## 2023-01-13 DIAGNOSIS — R54 Age-related physical debility: Secondary | ICD-10-CM | POA: Diagnosis not present

## 2023-01-13 DIAGNOSIS — I10 Essential (primary) hypertension: Secondary | ICD-10-CM | POA: Diagnosis not present

## 2023-01-14 ENCOUNTER — Ambulatory Visit (INDEPENDENT_AMBULATORY_CARE_PROVIDER_SITE_OTHER): Payer: Medicare HMO | Admitting: Podiatry

## 2023-01-14 VITALS — BP 136/51

## 2023-01-14 DIAGNOSIS — J9611 Chronic respiratory failure with hypoxia: Secondary | ICD-10-CM | POA: Diagnosis not present

## 2023-01-14 DIAGNOSIS — Z9889 Other specified postprocedural states: Secondary | ICD-10-CM

## 2023-01-14 DIAGNOSIS — I5022 Chronic systolic (congestive) heart failure: Secondary | ICD-10-CM | POA: Diagnosis not present

## 2023-01-14 DIAGNOSIS — Z4781 Encounter for orthopedic aftercare following surgical amputation: Secondary | ICD-10-CM | POA: Diagnosis not present

## 2023-01-14 DIAGNOSIS — R2689 Other abnormalities of gait and mobility: Secondary | ICD-10-CM | POA: Diagnosis not present

## 2023-01-14 DIAGNOSIS — I70262 Atherosclerosis of native arteries of extremities with gangrene, left leg: Secondary | ICD-10-CM | POA: Diagnosis not present

## 2023-01-14 DIAGNOSIS — M6281 Muscle weakness (generalized): Secondary | ICD-10-CM | POA: Diagnosis not present

## 2023-01-14 MED ORDER — CEPHALEXIN 500 MG PO CAPS: 500.0000 mg | ORAL_CAPSULE | Freq: Three times a day (TID) | ORAL | 0 refills | Status: DC

## 2023-01-14 MED ORDER — CEPHALEXIN 500 MG PO CAPS: 500.0000 mg | ORAL_CAPSULE | Freq: Three times a day (TID) | ORAL | 0 refills | Status: AC

## 2023-01-14 NOTE — Progress Notes (Signed)
Chief Complaint  Patient presents with   Routine Post Op    POV#1 left hallux amputation, Patient denies any pain, NO N/V/F/C/SOB, little swelling and redness     Subjective:  Patient presents today status post left great toe amputation/partial first ray.  DOS: 01/03/2023.  Patient states that she is doing well.  She is currently at [facility] and they are changing her dressings daily.  Presenting for outpatient follow-up  Past Medical History:  Diagnosis Date   Acute myelogenous leukemia ([place]) 02/2014   Acute pancreatitis    Arthritis    "back" (09/17/2017)   Atrial flutter (HCC)    Cardiomyopathy (HCC)    CHF (congestive heart failure) (HCC)    Chronic kidney disease, stage II (mild)    Chronic lower back pain    Colon polyps 04/29/2010   TUBULAR ADENOMA AND A SERRATED ADENOMA   COPD (chronic obstructive pulmonary disease) ([place])    CORONARY ARTERY DISEASE 12/24/2007   DIABETES MELLITUS, TYPE II 07/13/2007   History of blood transfusion 2015   "related to leukemia"   History of kidney stones 12/24/2007   History of uterine cancer    HYPERLIPIDEMIA 12/24/2007   HYPERTENSION 07/13/2007   NSTEMI (non-ST elevated myocardial infarction) ([place]) 09/17/2017   Uterine cancer ([place])     Past Surgical History:  Procedure Laterality Date   ABDOMINAL AORTOGRAM W/LOWER EXTREMITY Bilateral 12/24/2022   Procedure: ABDOMINAL AORTOGRAM W/LOWER EXTREMITY;  Surgeon: Broadus John, MD;  Location: [place] [facility];  Service: Cardiovascular;  Laterality: Bilateral;   ABDOMINAL AORTOGRAM W/LOWER EXTREMITY N/A 01/05/2023   Procedure: ABDOMINAL AORTOGRAM W/LOWER EXTREMITY;  Surgeon: Cherre Robins, MD;  Location: [place] [facility];  Service: Cardiovascular;  Laterality: N/A;   ABDOMINAL HYSTERECTOMY     "still have my ovaries"   AMPUTATION TOE Left 01/03/2023   Procedure: AMPUTATION LEFT GREAT TOE with Partial First Ray Amputation;  Surgeon: Edrick Kins, DPM;  Location: [place];  Service:  Podiatry;  Laterality: Left;   BREAST BIOPSY Left 09/22/2019   BREAST LUMPECTOMY Left 10/24/2019   BREAST LUMPECTOMY WITH RADIOACTIVE SEED LOCALIZATION Left 10/24/2019   Procedure: LEFT BREAST LUMPECTOMY WITH RADIOACTIVE SEED LOCALIZATION;  Surgeon: Jovita Kussmaul, MD;  Location: [place];  Service: General;  Laterality: Left;   CARDIAC CATHETERIZATION  2002   CARDIAC CATHETERIZATION  09/17/2017   CATARACT EXTRACTION W/ INTRAOCULAR LENS  IMPLANT, BILATERAL Bilateral    COLONOSCOPY W/ BIOPSIES AND POLYPECTOMY  2011   CORONARY STENT INTERVENTION N/A 09/21/2017   Procedure: CORONARY STENT INTERVENTION;  Surgeon: Troy Sine, MD;  Location: [place] [facility];  Service: Cardiovascular;  Laterality: N/A;   DILATION AND CURETTAGE OF UTERUS     ESOPHAGOGASTRODUODENOSCOPY N/A 01/30/2022   Procedure: ESOPHAGOGASTRODUODENOSCOPY (EGD);  Surgeon: Milus Banister, MD;  Location: [facility];  Service: Endoscopy;  Laterality: N/A;   EUS N/A 01/30/2022   Procedure: UPPER ENDOSCOPIC ULTRASOUND (EUS) RADIAL;  Surgeon: Milus Banister, MD;  Location: [facility];  Service: Endoscopy;  Laterality: N/A;   EXCISIONAL HEMORRHOIDECTOMY  X 2   FINE NEEDLE ASPIRATION N/A 01/30/2022   Procedure: FINE NEEDLE ASPIRATION (FNA) LINEAR;  Surgeon: Milus Banister, MD;  Location: [facility];  Service: Endoscopy;  Laterality: N/A;   GANGLION CYST EXCISION Left X 3   INTRAMEDULLARY (IM) NAIL INTERTROCHANTERIC Right 08/30/2022   Procedure: INTRAMEDULLARY (IM) NAIL INTERTROCHANTERIC;  Surgeon: Altamese Mokane, MD;  Location: [place];  Service: Orthopedics;  Laterality: Right;  INTRAVASCULAR PRESSURE WIRE/FFR STUDY N/A 09/17/2017   Procedure: INTRAVASCULAR PRESSURE WIRE/FFR STUDY;  Surgeon: Nelva Bush, MD;  Location: [place] [facility];  Service: Cardiovascular;  Laterality: N/A;   IR THORACENTESIS ASP PLEURAL SPACE W/IMG GUIDE  10/27/2022   NASAL SINUS SURGERY     PERIPHERAL VASCULAR BALLOON  ANGIOPLASTY  12/24/2022   Procedure: PERIPHERAL VASCULAR BALLOON ANGIOPLASTY;  Surgeon: Broadus John, MD;  Location: [place] [facility];  Service: Cardiovascular;;  Lt. SFA   PERIPHERAL VASCULAR INTERVENTION  01/05/2023   Procedure: PERIPHERAL VASCULAR INTERVENTION;  Surgeon: Cherre Robins, MD;  Location: [place] [facility];  Service: Cardiovascular;;   PORTA CATH INSERTION Right 2013   RIGHT/LEFT HEART CATH AND CORONARY ANGIOGRAPHY N/A 09/17/2017   Procedure: RIGHT/LEFT HEART CATH AND CORONARY ANGIOGRAPHY;  Surgeon: Nelva Bush, MD;  Location: [place] [facility];  Service: Cardiovascular;  Laterality: N/A;   TEE WITHOUT CARDIOVERSION N/A 11/19/2022   Procedure: TRANSESOPHAGEAL ECHOCARDIOGRAM (TEE);  Surgeon: Hebert Soho, DO;  Location: [facility];  Service: Cardiovascular;  Laterality: N/A;   TUBAL LIGATION      Allergies  Allergen Reactions   Chlorhexidine Gluconate Itching and Rash    Objective/Physical Exam Neurovascular status intact.  Incision well coapted with sutures intact.  No active bleeding or drainage.  No dehiscence.  There is some mild localized erythema around the forefoot.  Skin is cool to touch  Assessment: 1. s/p partial first ray amputation LT foot. DOS: 01/03/2023 2. S/p abdominal aortogram with peripheral vascular balloon angioplasty left SFA.  DOS: 12/24/2022 -Patient evaluated. -Patient currently not on any oral antibiotics.  Initial condition of the toe was dry stable gangrene secondary to ischemia. -Prescription for Keflex 500 mg TID #30 -Continue daily dressing changes at [facility] -Return to clinic 2 weeks  Edrick Kins, DPM [facility]  Dr. Edrick Kins, DPM    [address], [place] [address]                Office [phone]  Fax [phone]

## 2023-01-16 DIAGNOSIS — I70262 Atherosclerosis of native arteries of extremities with gangrene, left leg: Secondary | ICD-10-CM | POA: Diagnosis not present

## 2023-01-16 DIAGNOSIS — R54 Age-related physical debility: Secondary | ICD-10-CM | POA: Diagnosis not present

## 2023-01-16 DIAGNOSIS — I10 Essential (primary) hypertension: Secondary | ICD-10-CM | POA: Diagnosis not present

## 2023-01-19 ENCOUNTER — Ambulatory Visit: Payer: Medicare HMO | Admitting: Podiatry

## 2023-01-19 DIAGNOSIS — B379 Candidiasis, unspecified: Secondary | ICD-10-CM | POA: Diagnosis not present

## 2023-01-21 DIAGNOSIS — R2689 Other abnormalities of gait and mobility: Secondary | ICD-10-CM | POA: Diagnosis not present

## 2023-01-21 DIAGNOSIS — I5033 Acute on chronic diastolic (congestive) heart failure: Secondary | ICD-10-CM | POA: Diagnosis not present

## 2023-01-21 DIAGNOSIS — Z4781 Encounter for orthopedic aftercare following surgical amputation: Secondary | ICD-10-CM | POA: Diagnosis not present

## 2023-01-21 DIAGNOSIS — M6281 Muscle weakness (generalized): Secondary | ICD-10-CM | POA: Diagnosis not present

## 2023-01-21 DIAGNOSIS — J9611 Chronic respiratory failure with hypoxia: Secondary | ICD-10-CM | POA: Diagnosis not present

## 2023-01-21 DIAGNOSIS — I5022 Chronic systolic (congestive) heart failure: Secondary | ICD-10-CM | POA: Diagnosis not present

## 2023-01-22 ENCOUNTER — Encounter: Payer: Self-pay | Admitting: Neurology

## 2023-01-22 ENCOUNTER — Ambulatory Visit (INDEPENDENT_AMBULATORY_CARE_PROVIDER_SITE_OTHER): Payer: Medicare HMO | Admitting: Neurology

## 2023-01-22 VITALS — BP 124/64 | HR 73

## 2023-01-22 DIAGNOSIS — R569 Unspecified convulsions: Secondary | ICD-10-CM

## 2023-01-22 DIAGNOSIS — D329 Benign neoplasm of meninges, unspecified: Secondary | ICD-10-CM

## 2023-01-22 MED ORDER — LEVETIRACETAM 500 MG PO TABS: 500.0000 mg | ORAL_TABLET | Freq: Every evening | ORAL | 3 refills | Status: AC

## 2023-01-22 NOTE — Patient Instructions (Signed)
Decrease Keppra to 500 mg nightly Continue your other medications Follow-up in 1 year or sooner if worse.

## 2023-01-22 NOTE — Progress Notes (Signed)
[facility]  PATIENT: [name] DOB: Jul 06, 1941  REQUESTING CLINICIAN: Isaac Bliss, [place]* HISTORY FROM: Patient  REASON FOR VISIT: New onset seizures   HISTORICAL  CHIEF COMPLAINT:  Chief Complaint  Patient presents with   Follow-up    Rm 12 here for yearly f/u. Doing well no complaints.     INTERVAL HISTORY 01/22/2023:  Patient presents today for follow-up, last visit was over a year ago.  Since then she has not had any seizure or seizure-like activity. She is compliant with the Keppra 500 mg twice daily but does report daytime somnolence.  Her main complaint today is a breathing problem and edema, but again in terms of the seizures, she is stable. She is a resident a skilled nursing home facility.    HISTORY OF PRESENT ILLNESS:  This is a 82 year old woman with past medical history of leukemia in remission, heart failure, history of breast cancer, diabetes mellitus, hypertension hyperlipidemia and CKD stage IV who initially presented to the hospital on 12/29 for slurred speech described as inability to get words out.  Patient reported she was aware what was going on but could not speak.  At that time she was found to be hyperglycemic and admitted for insulin drip.  While in the hospital she was noted to have seizure-like activity involving the right eye right side of the lip with associated drooling and aphasia.  She was loaded on Keppra, had a overnight EEG which captured 1 seizure arising from the left temporal region lasting for 3 minutes.  She was then started on Keppra.  She was stabilized and later discharge.  Patient report of only being given 1 month supply of Keppra therefore she ran out 5 days ago.  Since leaving the hospital she has not had any seizure-like activity, denies any additional episode of aphasia. She denies any previous history of seizures  Handedness: right handed   Onset: December 29  Seizure Type: Focal onset  Current  frequency: First lifetime seizures  Any injuries from seizures: No   Seizure risk factors: Denies, but she has a history of cancer  Previous ASMs: None  Currenty ASMs: Levetiracetam 750 mg twice daily  ASMs side effects: Denies  Brain Images: 17 mm anterior falcine meningioma without edema  Previous EEGs: Left temporal slowing, seizures coming from the left temporal region.   OTHER MEDICAL CONDITIONS: Hypertension, hyperlipidemia, diabetes mellitus type 2, leukemia in his remission, history of breast cancer  REVIEW OF SYSTEMS: Full 14 system review of systems performed and negative with exception of: As noted in the HPI.  ALLERGIES: Allergies  Allergen Reactions   Chlorhexidine Gluconate Itching and Rash    HOME MEDICATIONS: Outpatient Medications Prior to Visit  Medication Sig Dispense Refill   apixaban (ELIQUIS) 2.5 MG TABS tablet Take 1 tablet (2.5 mg total) by mouth 2 (two) times daily. 60 tablet 0   ascorbic acid (VITAMIN C) 1000 MG tablet Take 1 tablet (1,000 mg total) by mouth daily. 30 tablet 0   atorvastatin (LIPITOR) 40 MG tablet Take 1 tablet (40 mg total) by mouth daily. 30 tablet 0   calcium acetate (PHOSLO) 667 MG capsule Take 2 capsules (1,334 mg total) by mouth 3 (three) times daily with meals. 180 capsule 2   calcium carbonate (OS-CAL - DOSED IN MG OF ELEMENTAL CALCIUM) 1250 (500 Ca) MG tablet Take 1 tablet (1,250 mg total) by mouth 2 (two) times daily with a meal.     cephALEXin (KEFLEX) 500 MG capsule  Take 1 capsule (500 mg total) by mouth 3 (three) times daily. 30 capsule 0   clopidogrel (PLAVIX) 75 MG tablet Take 1 tablet (75 mg total) by mouth every evening. 30 tablet 0   cyanocobalamin (VITAMIN B12) 1000 MCG/ML injection INJECT 1ML IN THE MUSCLE EVERY 30 DAYS AS DIRECTED (Patient taking differently: Inject 1,000 mcg into the muscle every 30 (thirty) days.) 3 mL 3   docusate sodium (COLACE) 100 MG capsule Take 1 capsule (100 mg total) by mouth 2 (two) times  daily. 10 capsule 0   ferrous sulfate 325 (65 FE) MG tablet Take 1 tablet (325 mg total) by mouth 2 (two) times daily with a meal.  3   fluticasone furoate-vilanterol (BREO ELLIPTA) 200-25 MCG/ACT AEPB Inhale 1 puff into the lungs as needed (wheezing SOB). 1 each 0   folic acid (FOLVITE) 1 MG tablet Take 1 tablet (1 mg total) by mouth daily.     furosemide (LASIX) 80 MG tablet Take 1 tablet (80 mg total) by mouth 2 (two) times daily.     gabapentin (NEURONTIN) 100 MG capsule Take 100 mg by mouth 3 (three) times daily.     insulin aspart (NOVOLOG) 100 UNIT/ML FlexPen Before each meal 3 times a day, 140-199 - 2 units, 200-250 - 4 units, 251-299 - 6 units,  300-349 - 8 units,  350 or above 10 units. Insulin PEN if approved, provide syringes and needles if needed.Please switch to any approved short acting Insulin if needed. 15 mL 0   isosorbide mononitrate (IMDUR) 30 MG 24 hr tablet Take 1 tablet (30 mg total) by mouth daily. 30 tablet 0   letrozole (FEMARA) 2.5 MG tablet Take 1 tablet (2.5 mg total) by mouth daily. 90 tablet 3   LORazepam (ATIVAN) 0.5 MG tablet Take 1 tablet (0.5 mg total) by mouth every 6 (six) hours as needed for anxiety. 5 tablet 0   melatonin 3 MG TABS tablet Take 1 tablet (3 mg total) by mouth at bedtime. (Patient taking differently: Take 3 mg by mouth at bedtime as needed (sleep).) 30 tablet 0   methocarbamol (ROBAXIN) 500 MG tablet Take 1 tablet (500 mg total) by mouth every 4 (four) hours as needed for muscle spasms. 60 tablet 0   metoprolol succinate (TOPROL-XL) 25 MG 24 hr tablet Take 0.5 tablets (12.5 mg total) by mouth daily. Take with or immediately following a meal. 30 tablet 0   Multiple Vitamin (MULTIVITAMIN WITH MINERALS) TABS tablet Take 1 tablet by mouth daily.     nitroGLYCERIN (NITROSTAT) 0.4 MG SL tablet Place 0.4 mg under the tongue every 5 (five) minutes as needed for chest pain.     oxyCODONE-acetaminophen (PERCOCET/ROXICET) 5-325 MG tablet Take 1-2 tablets by  mouth every 6 (six) hours as needed for severe pain. 5 tablet 0   polyethylene glycol (MIRALAX / GLYCOLAX) 17 g packet Take 17 g by mouth daily. 14 each 0   potassium chloride 20 MEQ TBCR Take 0.5 tablets (10 mEq total) by mouth 2 (two) times daily.     psyllium (HYDROCIL/METAMUCIL) 95 % PACK Take 1 packet by mouth daily. 240 each    Vitamin D, Ergocalciferol, (DRISDOL) 1.25 MG (50000 UNIT) CAPS capsule Take 1 capsule (50,000 Units total) by mouth every 7 (seven) days. 5 capsule 0   levETIRAcetam (KEPPRA) 500 MG tablet TAKE 1 TABLET(500 MG) BY MOUTH TWICE DAILY (Patient taking differently: Take 500 mg by mouth 2 (two) times daily.) 180 tablet 0   Facility-Administered Medications Prior  to Visit  Medication Dose Route Frequency Provider Last Rate Last Admin   sodium chloride 0.9 % 1,000 mL with magnesium sulfate 2 g infusion   Intravenous Continuous Ladell Pier, MD   Stopped at 08/04/14 1441    PAST MEDICAL HISTORY: Past Medical History:  Diagnosis Date   Acute myelogenous leukemia ([place]) 02/2014   Acute pancreatitis    Arthritis    "back" (09/17/2017)   Atrial flutter (H[facility])    Cardiomyopathy (H[facility])    CHF (congestive heart failure) (HCC)    Chronic kidney disease, stage II (mild)    Chronic lower back pain    Colon polyps 04/29/2010   TUBULAR ADENOMA AND A SERRATED ADENOMA   COPD (chronic obstructive pulmonary disease) ([place])    CORONARY ARTERY DISEASE 12/24/2007   DIABETES MELLITUS, TYPE II 07/13/2007   History of blood transfusion 2015   "related to leukemia"   History of kidney stones 12/24/2007   History of uterine cancer    HYPERLIPIDEMIA 12/24/2007   HYPERTENSION 07/13/2007   NSTEMI (non-ST elevated myocardial infarction) ([place]) 09/17/2017   Uterine cancer ([place])     PAST SURGICAL HISTORY: Past Surgical History:  Procedure Laterality Date   ABDOMINAL AORTOGRAM W/LOWER EXTREMITY Bilateral 12/24/2022   Procedure: ABDOMINAL AORTOGRAM W/LOWER EXTREMITY;  Surgeon: Broadus John, MD;  Location: [facility];  Service: Cardiovascular;  Laterality: Bilateral;   ABDOMINAL AORTOGRAM W/LOWER EXTREMITY N/A 01/05/2023   Procedure: ABDOMINAL AORTOGRAM W/LOWER EXTREMITY;  Surgeon: Cherre Robins, MD;  Location: [facility];  Service: Cardiovascular;  Laterality: N/A;   ABDOMINAL HYSTERECTOMY     "still have my ovaries"   AMPUTATION TOE Left 01/03/2023   Procedure: AMPUTATION LEFT GREAT TOE with Partial First Ray Amputation;  Surgeon: Edrick Kins, DPM;  Location: [facility];  Service: Podiatry;  Laterality: Left;   BREAST BIOPSY Left 09/22/2019   BREAST LUMPECTOMY Left 10/24/2019   BREAST LUMPECTOMY WITH RADIOACTIVE SEED LOCALIZATION Left 10/24/2019   Procedure: LEFT BREAST LUMPECTOMY WITH RADIOACTIVE SEED LOCALIZATION;  Surgeon: Jovita Kussmaul, MD;  Location: [facility];  Service: General;  Laterality: Left;   CARDIAC CATHETERIZATION  2002   CARDIAC CATHETERIZATION  09/17/2017   CATARACT EXTRACTION W/ INTRAOCULAR LENS  IMPLANT, BILATERAL Bilateral    COLONOSCOPY W/ BIOPSIES AND POLYPECTOMY  2011   CORONARY STENT INTERVENTION N/A 09/21/2017   Procedure: CORONARY STENT INTERVENTION;  Surgeon: Troy Sine, MD;  Location: [facility];  Service: Cardiovascular;  Laterality: N/A;   DILATION AND CURETTAGE OF UTERUS     ESOPHAGOGASTRODUODENOSCOPY N/A 01/30/2022   Procedure: ESOPHAGOGASTRODUODENOSCOPY (EGD);  Surgeon: Milus Banister, MD;  Location: [facility];  Service: Endoscopy;  Laterality: N/A;   EUS N/A 01/30/2022   Procedure: UPPER ENDOSCOPIC ULTRASOUND (EUS) RADIAL;  Surgeon: Milus Banister, MD;  Location: [facility];  Service: Endoscopy;  Laterality: N/A;   EXCISIONAL HEMORRHOIDECTOMY  X 2   FINE NEEDLE ASPIRATION N/A 01/30/2022   Procedure: FINE NEEDLE ASPIRATION (FNA) LINEAR;  Surgeon: Milus Banister, MD;  Location: [facility];  Service: Endoscopy;  Laterality: N/A;   GANGLION CYST EXCISION Left X 3   INTRAMEDULLARY  (IM) NAIL INTERTROCHANTERIC Right 08/30/2022   Procedure: INTRAMEDULLARY (IM) NAIL INTERTROCHANTERIC;  Surgeon: Altamese Republic, MD;  Location: [facility];  Service: Orthopedics;  Laterality: Right;   INTRAVASCULAR PRESSURE WIRE/FFR STUDY N/A 09/17/2017   Procedure: INTRAVASCULAR PRESSURE WIRE/FFR STUDY;  Surgeon: Nelva Bush, MD;  Location: [facility];  Service:  Cardiovascular;  Laterality: N/A;   IR THORACENTESIS ASP PLEURAL SPACE W/IMG GUIDE  10/27/2022   NASAL SINUS SURGERY     PERIPHERAL VASCULAR BALLOON ANGIOPLASTY  12/24/2022   Procedure: PERIPHERAL VASCULAR BALLOON ANGIOPLASTY;  Surgeon: Broadus John, MD;  Location: [facility];  Service: Cardiovascular;;  Lt. SFA   PERIPHERAL VASCULAR INTERVENTION  01/05/2023   Procedure: PERIPHERAL VASCULAR INTERVENTION;  Surgeon: Cherre Robins, MD;  Location: [facility];  Service: Cardiovascular;;   PORTA CATH INSERTION Right 2013   RIGHT/LEFT HEART CATH AND CORONARY ANGIOGRAPHY N/A 09/17/2017   Procedure: RIGHT/LEFT HEART CATH AND CORONARY ANGIOGRAPHY;  Surgeon: Nelva Bush, MD;  Location: [facility];  Service: Cardiovascular;  Laterality: N/A;   TEE WITHOUT CARDIOVERSION N/A 11/19/2022   Procedure: TRANSESOPHAGEAL ECHOCARDIOGRAM (TEE);  Surgeon: Hebert Soho, DO;  Location: [facility];  Service: Cardiovascular;  Laterality: N/A;   TUBAL LIGATION      FAMILY HISTORY: Family History  Problem Relation Age of Onset   COPD Father    Esophageal cancer Father    Breast cancer Sister 50   Lung cancer Sister    Esophageal cancer Brother    Suicidality Brother    Lung cancer Sister    Lung cancer Sister    Cervical cancer Sister    Congestive Heart Failure Mother    Heart attack Maternal Grandmother    Skin cancer Maternal Grandfather    Heart attack Maternal Grandfather    Stroke Paternal Grandmother 45   Heart attack Paternal Grandfather    Cancer Maternal Uncle        unknown type, dx. >65    Cancer Paternal Aunt        unknown type, dx. >50   Cancer Paternal Uncle        unknown type, dx. >50   Cirrhosis Maternal Uncle    Skin cancer Daughter    Leukemia Cousin        maternal first cousin; dx. >50, in remission   Cancer Niece        unknown type behind her ear, dx. 30s/40s   Colon cancer Neg Hx     SOCIAL HISTORY: Social History   Socioeconomic History   Marital status: Married    Spouse name: Taniaya Schaafsma   Number of children: 1   Years of education: Not on file   Highest education level: 9th grade  Occupational History   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  Tobacco Use   Smoking status: Former    Packs/day: 1.00    Years: 20.00    Total pack years: 20.00    Types: Cigarettes    Quit date: 12/08/2000    Years since quitting: 22.1   Smokeless tobacco: Never  Vaping Use   Vaping Use: Never used  Substance and Sexual Activity   Alcohol use: No    Alcohol/week: 0.0 standard drinks of alcohol   Drug use: No   Sexual activity: Never  Other Topics Concern   Not on file  Social History Narrative   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$    Social Determinants of Health   Financial Resource Strain: Low Risk  (10/22/2022)   Overall Financial Resource Strain (CARDIA)    Difficulty of Paying Living Expenses: Not very hard  Food Insecurity: No Food Insecurity (01/07/2023)   Hunger Vital Sign    Worried About Running Out of Food in the Last Year: Never  true    Ran Out of Food in the Last Year: Never true  Transportation Needs: No Transportation Needs (01/07/2023)   PRAPARE - Hydrologist (Medical): No    Lack of Transportation (Non-Medical): No  Physical Activity: Not on file  Stress: No Stress Concern Present (05/12/2022)   [place]    Feeling of Stress : Not at all  Social Connections: Not on file  Intimate Partner  Violence: Not At Risk (01/07/2023)   Humiliation, Afraid, Rape, and Kick questionnaire    Fear of Current or Ex-Partner: No    Emotionally Abused: No    Physically Abused: No    Sexually Abused: No    PHYSICAL EXAM  GENERAL EXAM/CONSTITUTIONAL: Vitals:  Vitals:   01/22/23 1429  BP: 124/64  Pulse: 73  SpO2: 97%   There is no height or weight on file to calculate BMI. Wt Readings from Last 3 Encounters:  01/09/23 155 lb 13.8 oz (70.7 kg)  12/26/22 152 lb 12.8 oz (69.3 kg)  11/19/22 147 lb 11.3 oz (67 kg)   Patient is in no distress; well developed, nourished and groomed; neck is supple. Using supplemental oxygen  EYES: Visual fields full to confrontation, Extraocular movements intacts,  No results found.  MUSCULOSKELETAL: Gait, strength, tone, movements noted in Neurologic exam below  NEUROLOGIC: MENTAL STATUS:      No data to display         awake, alert, oriented to person, place and time recent and remote memory intact normal attention and concentration language fluent, comprehension intact, naming intact fund of knowledge appropriate  CRANIAL NERVE:  2nd, 3rd, 4th, 6 visual fields full to confrontation, extraocular muscles intact, no nystagmus 5th - facial sensation symmetric 7th - facial strength symmetric 8th - hearing intact 9th - palate elevates symmetrically, uvula midline 11th - shoulder shrug symmetric 12th - tongue protrusion midline  MOTOR:  normal bulk and tone, full strength in the BUE, BLE  SENSORY:  normal and symmetric to light touch  COORDINATION:  finger-nose-finger, fine finger movements normal  GAIT/STATION:  Deferred, in a wheelchair    DIAGNOSTIC DATA (LABS, IMAGING, TESTING) - I reviewed patient records, labs, notes, testing and imaging myself where available.  Lab Results  Component Value Date   WBC 5.7 01/09/2023   HGB 7.8 (L) 01/09/2023   HCT 24.9 (L) 01/09/2023   MCV 99.2 01/09/2023   PLT 124 (L) 01/09/2023       Component Value Date/Time   NA 140 01/09/2023 0400   NA 147 (H) 05/19/2022 1605   NA 144 09/04/2017 1436   K 4.1 01/09/2023 0400   K 4.1 09/04/2017 1436   CL 105 01/09/2023 0400   CO2 28 01/09/2023 0400   CO2 26 09/04/2017 1436   GLUCOSE 54 (L) 01/09/2023 0400   GLUCOSE 152 (H) 09/04/2017 1436   BUN 74 (H) 01/09/2023 0400   BUN 26 05/19/2022 1605   BUN 20.6 09/04/2017 1436   CREATININE 2.56 (H) 01/09/2023 0400   CREATININE 1.53 (H) 01/22/2022 1457   CREATININE 1.4 (H) 09/04/2017 1436   CALCIUM 7.7 (L) 01/09/2023 0400   CALCIUM 9.4 09/04/2017 1436   PROT 4.5 (L) 01/09/2023 0400   PROT 5.7 (L) 11/19/2020 0827   PROT 6.3 (L) 09/04/2017 1436   ALBUMIN 1.8 (L) 01/09/2023 0400   ALBUMIN 3.4 (L) 11/19/2020 0827   ALBUMIN 3.4 (L) 09/04/2017 1436   AST 18 01/09/2023 0400  AST 18 01/22/2022 1457   AST 20 09/04/2017 1436   ALT 9 01/09/2023 0400   ALT 13 01/22/2022 1457   ALT 15 09/04/2017 1436   ALKPHOS 104 01/09/2023 0400   ALKPHOS 84 09/04/2017 1436   BILITOT 0.5 01/09/2023 0400   BILITOT 0.4 01/22/2022 1457   BILITOT 0.68 09/04/2017 1436   GFRNONAA 18 (L) 01/09/2023 0400   GFRNONAA 34 (L) 01/22/2022 1457   GFRAA 39 (L) 10/04/2020 1404   GFRAA 39 (L) 03/29/2019 1459   Lab Results  Component Value Date   CHOL 79 01/06/2023   HDL 43 01/06/2023   LDLCALC 27 01/06/2023   TRIG 46 01/06/2023   Lab Results  Component Value Date   HGBA1C 9.9 (A) 08/13/2022   No results found for: "VITAMINB12" Lab Results  Component Value Date   TSH 2.708 12/07/2022    EEG 12/07/2021 - Seizure without clinical signs, left temporal region - Intermittent slow, left temporal region   IMPRESSION: This study showed one seizure without clinical signs on 12/06/2021 at 2237 arising from left temporal region, lasting about 3 minutes. There is also cortical dysfunction in left temporal region likely secondary to underlying structural abnormality.   MRI Brain 12/06/2021 1. Aging brain without  acute finding or explanation for symptoms. 2. 17 mm anterior falcine meningioma.  No adjacent brain edema.  I personally reviewed brain Images and previous EEG reports.   ASSESSMENT AND PLAN  82 y.o. year old female  with vascular risk factor including diabetes mellitus type 2, hypertension, hyperlipidemia, leukemia in remission, history of breast cancer in remission, seizure disorder who is presenting for follow up. In term of the seizures, no seizures or seizures like event for the past year. She is complaint with Keppra 500 mg twice daily but does report daytime somnolence. Her latest GFR decrease to 18, I will further decrease the Keppra to 500 mg nightly. I will see her in 1 year of follow up or sooner if worse. In the case of breakthrough seizure, we will go ahead and increase Keppra back to 500 mg twice daily. At follow up, we will obtain another brain MRI to follow up the meningioma.     1. Seizure ([place])   2. Meningioma [facility])      Patient Instructions  Decrease Keppra to 500 mg nightly Continue your other medications Follow-up in 1 year or sooner if worse.   Per [facility] statutes, patients with seizures are not allowed to drive until they have been seizure-free for six months.  Other recommendations include using caution when using heavy equipment or power tools. Avoid working on ladders or at heights. Take showers instead of baths.  Do not swim alone.  Ensure the water temperature is not too high on the home water heater. Do not go swimming alone. Do not lock yourself in a room alone (i.e. bathroom). When caring for infants or small children, sit down when holding, feeding, or changing them to minimize risk of injury to the child in the event you have a seizure. Maintain good sleep hygiene. Avoid alcohol.  Also recommend adequate sleep, hydration, good diet and minimize stress.   During the Seizure  - First, ensure adequate ventilation and place patients on the floor on  their left side  Loosen clothing around the neck and ensure the airway is patent. If the patient is clenching the teeth, do not force the mouth open with any object as this can cause severe damage - Remove all items  from the surrounding that can be hazardous. The patient may be oblivious to what's happening and may not even know what he or she is doing. If the patient is confused and wandering, either gently guide him/her away and block access to outside areas - Reassure the individual and be comforting - Call 911. In most cases, the seizure ends before EMS arrives. However, there are cases when seizures may last over 3 to 5 minutes. Or the individual may have developed breathing difficulties or severe injuries. If a pregnant patient or a person with diabetes develops a seizure, it is prudent to call an ambulance. - Finally, if the patient does not regain full consciousness, then call EMS. Most patients will remain confused for about 45 to 90 minutes after a seizure, so you must use judgment in calling for help. - Avoid restraints but make sure the patient is in a bed with padded side rails - Place the individual in a lateral position with the neck slightly flexed; this will help the saliva drain from the mouth and prevent the tongue from falling backward - Remove all nearby furniture and other hazards from the area - Provide verbal assurance as the individual is regaining consciousness - Provide the patient with privacy if possible - Call for help and start treatment as ordered by the caregiver   After the Seizure (Postictal Stage)  After a seizure, most patients experience confusion, fatigue, muscle pain and/or a headache. Thus, one should permit the individual to sleep. For the next few days, reassurance is essential. Being calm and helping reorient the person is also of importance.  Most seizures are painless and end spontaneously. Seizures are not harmful to others but can lead to  complications such as stress on the lungs, brain and the heart. Individuals with prior lung problems may develop labored breathing and respiratory distress.     No orders of the defined types were placed in this encounter.   Meds ordered this encounter  Medications   levETIRAcetam (KEPPRA) 500 MG tablet    Sig: Take 1 tablet (500 mg total) by mouth at bedtime.    Dispense:  90 tablet    Refill:  3    **Patient requests 90 days supply**    Return in about 1 year (around 01/23/2024).    Alric Ran, MD 01/22/2023, 3:09 PM  [facility] [address] [place], [place] [address] [phone]

## 2023-01-23 ENCOUNTER — Other Ambulatory Visit: Payer: Self-pay | Admitting: *Deleted

## 2023-01-23 DIAGNOSIS — M79606 Pain in leg, unspecified: Secondary | ICD-10-CM

## 2023-01-26 ENCOUNTER — Inpatient Hospital Stay (HOSPITAL_COMMUNITY)
Admission: EM | Admit: 2023-01-26 | Discharge: 2023-02-06 | DRG: 291 | Disposition: E | Payer: Medicare HMO | Source: Skilled Nursing Facility | Attending: Internal Medicine | Admitting: Internal Medicine

## 2023-01-26 ENCOUNTER — Encounter (HOSPITAL_COMMUNITY): Payer: Self-pay | Admitting: Emergency Medicine

## 2023-01-26 ENCOUNTER — Emergency Department (HOSPITAL_COMMUNITY): Payer: Medicare HMO

## 2023-01-26 ENCOUNTER — Other Ambulatory Visit: Payer: Self-pay

## 2023-01-26 DIAGNOSIS — E785 Hyperlipidemia, unspecified: Secondary | ICD-10-CM | POA: Diagnosis present

## 2023-01-26 DIAGNOSIS — Z515 Encounter for palliative care: Secondary | ICD-10-CM | POA: Diagnosis not present

## 2023-01-26 DIAGNOSIS — Z90710 Acquired absence of both cervix and uterus: Secondary | ICD-10-CM

## 2023-01-26 DIAGNOSIS — I4892 Unspecified atrial flutter: Secondary | ICD-10-CM | POA: Diagnosis present

## 2023-01-26 DIAGNOSIS — R5383 Other fatigue: Secondary | ICD-10-CM | POA: Diagnosis present

## 2023-01-26 DIAGNOSIS — C9200 Acute myeloblastic leukemia, not having achieved remission: Secondary | ICD-10-CM | POA: Diagnosis present

## 2023-01-26 DIAGNOSIS — Z1152 Encounter for screening for COVID-19: Secondary | ICD-10-CM

## 2023-01-26 DIAGNOSIS — E11649 Type 2 diabetes mellitus with hypoglycemia without coma: Secondary | ICD-10-CM | POA: Diagnosis not present

## 2023-01-26 DIAGNOSIS — I130 Hypertensive heart and chronic kidney disease with heart failure and stage 1 through stage 4 chronic kidney disease, or unspecified chronic kidney disease: Secondary | ICD-10-CM | POA: Diagnosis present

## 2023-01-26 DIAGNOSIS — Z955 Presence of coronary angioplasty implant and graft: Secondary | ICD-10-CM

## 2023-01-26 DIAGNOSIS — G9341 Metabolic encephalopathy: Secondary | ICD-10-CM | POA: Insufficient documentation

## 2023-01-26 DIAGNOSIS — E8809 Other disorders of plasma-protein metabolism, not elsewhere classified: Secondary | ICD-10-CM | POA: Diagnosis present

## 2023-01-26 DIAGNOSIS — I429 Cardiomyopathy, unspecified: Secondary | ICD-10-CM | POA: Diagnosis present

## 2023-01-26 DIAGNOSIS — I083 Combined rheumatic disorders of mitral, aortic and tricuspid valves: Secondary | ICD-10-CM | POA: Diagnosis present

## 2023-01-26 DIAGNOSIS — C50912 Malignant neoplasm of unspecified site of left female breast: Secondary | ICD-10-CM | POA: Diagnosis present

## 2023-01-26 DIAGNOSIS — D696 Thrombocytopenia, unspecified: Secondary | ICD-10-CM | POA: Diagnosis present

## 2023-01-26 DIAGNOSIS — R188 Other ascites: Secondary | ICD-10-CM | POA: Diagnosis present

## 2023-01-26 DIAGNOSIS — R001 Bradycardia, unspecified: Secondary | ICD-10-CM | POA: Diagnosis not present

## 2023-01-26 DIAGNOSIS — J449 Chronic obstructive pulmonary disease, unspecified: Secondary | ICD-10-CM | POA: Diagnosis present

## 2023-01-26 DIAGNOSIS — K7460 Unspecified cirrhosis of liver: Secondary | ICD-10-CM | POA: Diagnosis present

## 2023-01-26 DIAGNOSIS — Z7902 Long term (current) use of antithrombotics/antiplatelets: Secondary | ICD-10-CM

## 2023-01-26 DIAGNOSIS — R9431 Abnormal electrocardiogram [ECG] [EKG]: Secondary | ICD-10-CM | POA: Diagnosis not present

## 2023-01-26 DIAGNOSIS — E1165 Type 2 diabetes mellitus with hyperglycemia: Secondary | ICD-10-CM | POA: Diagnosis present

## 2023-01-26 DIAGNOSIS — I509 Heart failure, unspecified: Secondary | ICD-10-CM | POA: Diagnosis not present

## 2023-01-26 DIAGNOSIS — Z89412 Acquired absence of left great toe: Secondary | ICD-10-CM

## 2023-01-26 DIAGNOSIS — F419 Anxiety disorder, unspecified: Secondary | ICD-10-CM | POA: Diagnosis present

## 2023-01-26 DIAGNOSIS — E1151 Type 2 diabetes mellitus with diabetic peripheral angiopathy without gangrene: Secondary | ICD-10-CM | POA: Diagnosis present

## 2023-01-26 DIAGNOSIS — G40909 Epilepsy, unspecified, not intractable, without status epilepticus: Secondary | ICD-10-CM | POA: Diagnosis present

## 2023-01-26 DIAGNOSIS — R0602 Shortness of breath: Secondary | ICD-10-CM | POA: Diagnosis present

## 2023-01-26 DIAGNOSIS — Z7901 Long term (current) use of anticoagulants: Secondary | ICD-10-CM

## 2023-01-26 DIAGNOSIS — Z794 Long term (current) use of insulin: Secondary | ICD-10-CM

## 2023-01-26 DIAGNOSIS — R0902 Hypoxemia: Secondary | ICD-10-CM | POA: Diagnosis not present

## 2023-01-26 DIAGNOSIS — E1169 Type 2 diabetes mellitus with other specified complication: Secondary | ICD-10-CM | POA: Diagnosis present

## 2023-01-26 DIAGNOSIS — E1122 Type 2 diabetes mellitus with diabetic chronic kidney disease: Secondary | ICD-10-CM | POA: Diagnosis present

## 2023-01-26 DIAGNOSIS — R0682 Tachypnea, not elsewhere classified: Secondary | ICD-10-CM | POA: Diagnosis not present

## 2023-01-26 DIAGNOSIS — Z79811 Long term (current) use of aromatase inhibitors: Secondary | ICD-10-CM

## 2023-01-26 DIAGNOSIS — R531 Weakness: Secondary | ICD-10-CM | POA: Diagnosis not present

## 2023-01-26 DIAGNOSIS — G8929 Other chronic pain: Secondary | ICD-10-CM | POA: Diagnosis present

## 2023-01-26 DIAGNOSIS — R54 Age-related physical debility: Secondary | ICD-10-CM | POA: Diagnosis present

## 2023-01-26 DIAGNOSIS — Z888 Allergy status to other drugs, medicaments and biological substances status: Secondary | ICD-10-CM

## 2023-01-26 DIAGNOSIS — Z87891 Personal history of nicotine dependence: Secondary | ICD-10-CM

## 2023-01-26 DIAGNOSIS — Z66 Do not resuscitate: Secondary | ICD-10-CM | POA: Diagnosis present

## 2023-01-26 DIAGNOSIS — E876 Hypokalemia: Secondary | ICD-10-CM | POA: Insufficient documentation

## 2023-01-26 DIAGNOSIS — I4819 Other persistent atrial fibrillation: Secondary | ICD-10-CM | POA: Diagnosis present

## 2023-01-26 DIAGNOSIS — R601 Generalized edema: Secondary | ICD-10-CM | POA: Diagnosis not present

## 2023-01-26 DIAGNOSIS — E8770 Fluid overload, unspecified: Secondary | ICD-10-CM | POA: Diagnosis present

## 2023-01-26 DIAGNOSIS — M545 Low back pain, unspecified: Secondary | ICD-10-CM | POA: Diagnosis present

## 2023-01-26 DIAGNOSIS — E669 Obesity, unspecified: Secondary | ICD-10-CM | POA: Insufficient documentation

## 2023-01-26 DIAGNOSIS — I2510 Atherosclerotic heart disease of native coronary artery without angina pectoris: Secondary | ICD-10-CM | POA: Diagnosis present

## 2023-01-26 DIAGNOSIS — C9201 Acute myeloblastic leukemia, in remission: Secondary | ICD-10-CM | POA: Diagnosis present

## 2023-01-26 DIAGNOSIS — D638 Anemia in other chronic diseases classified elsewhere: Secondary | ICD-10-CM | POA: Diagnosis present

## 2023-01-26 DIAGNOSIS — Z87442 Personal history of urinary calculi: Secondary | ICD-10-CM

## 2023-01-26 DIAGNOSIS — Z79899 Other long term (current) drug therapy: Secondary | ICD-10-CM

## 2023-01-26 DIAGNOSIS — Z8719 Personal history of other diseases of the digestive system: Secondary | ICD-10-CM

## 2023-01-26 DIAGNOSIS — Z8249 Family history of ischemic heart disease and other diseases of the circulatory system: Secondary | ICD-10-CM

## 2023-01-26 DIAGNOSIS — I10 Essential (primary) hypertension: Secondary | ICD-10-CM | POA: Diagnosis not present

## 2023-01-26 DIAGNOSIS — M1990 Unspecified osteoarthritis, unspecified site: Secondary | ICD-10-CM | POA: Diagnosis present

## 2023-01-26 DIAGNOSIS — J9621 Acute and chronic respiratory failure with hypoxia: Secondary | ICD-10-CM | POA: Diagnosis present

## 2023-01-26 DIAGNOSIS — I5033 Acute on chronic diastolic (congestive) heart failure: Secondary | ICD-10-CM | POA: Diagnosis present

## 2023-01-26 DIAGNOSIS — J90 Pleural effusion, not elsewhere classified: Secondary | ICD-10-CM | POA: Diagnosis not present

## 2023-01-26 DIAGNOSIS — N184 Chronic kidney disease, stage 4 (severe): Secondary | ICD-10-CM | POA: Diagnosis present

## 2023-01-26 DIAGNOSIS — Z9981 Dependence on supplemental oxygen: Secondary | ICD-10-CM

## 2023-01-26 DIAGNOSIS — Z6830 Body mass index (BMI) 30.0-30.9, adult: Secondary | ICD-10-CM

## 2023-01-26 DIAGNOSIS — R1900 Intra-abdominal and pelvic swelling, mass and lump, unspecified site: Secondary | ICD-10-CM | POA: Diagnosis not present

## 2023-01-26 DIAGNOSIS — I252 Old myocardial infarction: Secondary | ICD-10-CM

## 2023-01-26 DIAGNOSIS — Z961 Presence of intraocular lens: Secondary | ICD-10-CM | POA: Diagnosis present

## 2023-01-26 DIAGNOSIS — R569 Unspecified convulsions: Secondary | ICD-10-CM

## 2023-01-26 DIAGNOSIS — Z7951 Long term (current) use of inhaled steroids: Secondary | ICD-10-CM

## 2023-01-26 DIAGNOSIS — Z8542 Personal history of malignant neoplasm of other parts of uterus: Secondary | ICD-10-CM

## 2023-01-26 DIAGNOSIS — I110 Hypertensive heart disease with heart failure: Secondary | ICD-10-CM | POA: Diagnosis not present

## 2023-01-26 DIAGNOSIS — E43 Unspecified severe protein-calorie malnutrition: Secondary | ICD-10-CM | POA: Insufficient documentation

## 2023-01-26 DIAGNOSIS — R5381 Other malaise: Secondary | ICD-10-CM | POA: Diagnosis present

## 2023-01-26 LAB — COMPREHENSIVE METABOLIC PANEL
ALT: 11 U/L (ref 0–44)
AST: 16 U/L (ref 15–41)
Albumin: 2 g/dL — ABNORMAL LOW (ref 3.5–5.0)
Alkaline Phosphatase: 106 U/L (ref 38–126)
Anion gap: 10 (ref 5–15)
BUN: 49 mg/dL — ABNORMAL HIGH (ref 8–23)
CO2: 22 mmol/L (ref 22–32)
Calcium: 7.1 mg/dL — ABNORMAL LOW (ref 8.9–10.3)
Chloride: 109 mmol/L (ref 98–111)
Creatinine, Ser: 2.13 mg/dL — ABNORMAL HIGH (ref 0.44–1.00)
GFR, Estimated: 23 mL/min — ABNORMAL LOW (ref >60)
Glucose, Bld: 103 mg/dL — ABNORMAL HIGH (ref 70–99)
Potassium: 4.8 mmol/L (ref 3.5–5.1)
Sodium: 141 mmol/L (ref 135–145)
Total Bilirubin: 0.5 mg/dL (ref 0.3–1.2)
Total Protein: 5 g/dL — ABNORMAL LOW (ref 6.5–8.1)

## 2023-01-26 LAB — LACTIC ACID, PLASMA
Lactic Acid, Venous: 0.9 mmol/L (ref 0.5–1.9)
Lactic Acid, Venous: 0.6 mmol/L (ref 0.5–1.9)

## 2023-01-26 LAB — CBC WITH DIFFERENTIAL/PLATELET
Abs Immature Granulocytes: 0.02 10*3/uL (ref 0.00–0.07)
Basophils Absolute: 0 10*3/uL (ref 0.0–0.1)
Basophils Relative: 0 %
Eosinophils Absolute: 0.1 10*3/uL (ref 0.0–0.5)
Eosinophils Relative: 2 %
HCT: 22.2 % — ABNORMAL LOW (ref 36.0–46.0)
Hemoglobin: 7 g/dL — ABNORMAL LOW (ref 12.0–15.0)
Immature Granulocytes: 1 %
Lymphocytes Relative: 14 %
Lymphs Abs: 0.5 10*3/uL — ABNORMAL LOW (ref 0.7–4.0)
MCH: 32.3 pg (ref 26.0–34.0)
MCHC: 31.5 g/dL (ref 30.0–36.0)
MCV: 102.3 fL — ABNORMAL HIGH (ref 80.0–100.0)
Monocytes Absolute: 0.3 10*3/uL (ref 0.1–1.0)
Monocytes Relative: 8 %
Neutro Abs: 3 10*3/uL (ref 1.7–7.7)
Neutrophils Relative %: 75 %
Platelets: 141 10*3/uL — ABNORMAL LOW (ref 150–400)
RBC: 2.17 MIL/uL — ABNORMAL LOW (ref 3.87–5.11)
RDW: 17.3 % — ABNORMAL HIGH (ref 11.5–15.5)
WBC: 4 10*3/uL (ref 4.0–10.5)
nRBC: 0 % (ref 0.0–0.2)

## 2023-01-26 LAB — MAGNESIUM: Magnesium: 1.5 mg/dL — ABNORMAL LOW (ref 1.7–2.4)

## 2023-01-26 LAB — BRAIN NATRIURETIC PEPTIDE: B Natriuretic Peptide: 677.3 pg/mL — ABNORMAL HIGH (ref 0.0–100.0)

## 2023-01-26 LAB — GLUCOSE, CAPILLARY: Glucose-Capillary: 172 mg/dL — ABNORMAL HIGH (ref 70–99)

## 2023-01-26 LAB — TROPONIN I (HIGH SENSITIVITY)
Troponin I (High Sensitivity): 12 ng/L (ref <18)
Troponin I (High Sensitivity): 12 ng/L (ref <18)

## 2023-01-26 LAB — RESP PANEL BY RT-PCR (RSV, FLU A&B, COVID)  RVPGX2
Influenza A by PCR: NEGATIVE
Influenza B by PCR: NEGATIVE
Resp Syncytial Virus by PCR: NEGATIVE
SARS Coronavirus 2 by RT PCR: NEGATIVE

## 2023-01-26 LAB — CBG MONITORING, ED: Glucose-Capillary: 76 mg/dL (ref 70–99)

## 2023-01-26 MED ORDER — PROCHLORPERAZINE EDISYLATE 10 MG/2ML IJ SOLN: 10.0000 mg | Freq: Four times a day (QID) | INTRAMUSCULAR | Status: DC | PRN

## 2023-01-26 MED ORDER — LETROZOLE 2.5 MG PO TABS
2.5000 mg | ORAL_TABLET | Freq: Every day | ORAL | Status: DC
  Administered 2023-01-27 – 2023-01-30 (×4): 2.5 mg via ORAL
  Filled 2023-01-26 (×4): qty 1

## 2023-01-26 MED ORDER — ACETAMINOPHEN 325 MG PO TABS
650.0000 mg | ORAL_TABLET | Freq: Four times a day (QID) | ORAL | Status: DC | PRN
  Administered 2023-01-27: 650 mg via ORAL
  Filled 2023-01-26: qty 2

## 2023-01-26 MED ORDER — NITROGLYCERIN 0.4 MG SL SUBL: 0.4000 mg | SUBLINGUAL_TABLET | SUBLINGUAL | Status: DC | PRN

## 2023-01-26 MED ORDER — ORAL CARE MOUTH RINSE: 15.0000 mL | OROMUCOSAL | Status: DC | PRN

## 2023-01-26 MED ORDER — FERROUS SULFATE 325 (65 FE) MG PO TABS
325.0000 mg | ORAL_TABLET | Freq: Two times a day (BID) | ORAL | Status: DC
  Administered 2023-01-27: 325 mg via ORAL
  Filled 2023-01-26: qty 1

## 2023-01-26 MED ORDER — CALCIUM ACETATE (PHOS BINDER) 667 MG PO CAPS
1334.0000 mg | ORAL_CAPSULE | Freq: Three times a day (TID) | ORAL | Status: DC
  Administered 2023-01-27 – 2023-01-30 (×11): 1334 mg via ORAL
  Filled 2023-01-26 (×11): qty 2

## 2023-01-26 MED ORDER — DOCUSATE SODIUM 100 MG PO CAPS
100.0000 mg | ORAL_CAPSULE | Freq: Two times a day (BID) | ORAL | Status: DC
  Administered 2023-01-26 – 2023-01-30 (×7): 100 mg via ORAL
  Filled 2023-01-26 (×7): qty 1

## 2023-01-26 MED ORDER — OXYCODONE HCL 5 MG PO TABS
5.0000 mg | ORAL_TABLET | ORAL | Status: DC | PRN
  Administered 2023-01-26 – 2023-01-30 (×8): 5 mg via ORAL
  Filled 2023-01-26 (×9): qty 1

## 2023-01-26 MED ORDER — APIXABAN 2.5 MG PO TABS
2.5000 mg | ORAL_TABLET | Freq: Two times a day (BID) | ORAL | Status: DC
  Administered 2023-01-26 – 2023-01-30 (×8): 2.5 mg via ORAL
  Filled 2023-01-26 (×8): qty 1

## 2023-01-26 MED ORDER — IPRATROPIUM-ALBUTEROL 0.5-2.5 (3) MG/3ML IN SOLN
3.0000 mL | Freq: Four times a day (QID) | RESPIRATORY_TRACT | Status: DC
  Administered 2023-01-26 – 2023-01-27 (×2): 3 mL via RESPIRATORY_TRACT
  Filled 2023-01-26 (×2): qty 3

## 2023-01-26 MED ORDER — FOLIC ACID 1 MG PO TABS
1.0000 mg | ORAL_TABLET | Freq: Every day | ORAL | Status: DC
  Administered 2023-01-27 – 2023-01-30 (×4): 1 mg via ORAL
  Filled 2023-01-26 (×4): qty 1

## 2023-01-26 MED ORDER — LORAZEPAM 0.5 MG PO TABS
0.5000 mg | ORAL_TABLET | Freq: Four times a day (QID) | ORAL | Status: DC | PRN
  Administered 2023-01-26 – 2023-01-27 (×2): 0.5 mg via ORAL
  Filled 2023-01-26 (×2): qty 1

## 2023-01-26 MED ORDER — IRBESARTAN 75 MG PO TABS
75.0000 mg | ORAL_TABLET | Freq: Every day | ORAL | Status: DC
  Administered 2023-01-27: 75 mg via ORAL
  Filled 2023-01-26: qty 1

## 2023-01-26 MED ORDER — GABAPENTIN 100 MG PO CAPS
100.0000 mg | ORAL_CAPSULE | Freq: Three times a day (TID) | ORAL | Status: DC
  Administered 2023-01-26 – 2023-01-30 (×11): 100 mg via ORAL
  Filled 2023-01-26 (×11): qty 1

## 2023-01-26 MED ORDER — POTASSIUM CHLORIDE CRYS ER 10 MEQ PO TBCR
10.0000 meq | EXTENDED_RELEASE_TABLET | Freq: Two times a day (BID) | ORAL | Status: DC
  Administered 2023-01-26 – 2023-01-29 (×7): 10 meq via ORAL
  Filled 2023-01-26 (×8): qty 1

## 2023-01-26 MED ORDER — ATORVASTATIN CALCIUM 40 MG PO TABS
40.0000 mg | ORAL_TABLET | Freq: Every day | ORAL | Status: DC
  Administered 2023-01-27 – 2023-01-30 (×4): 40 mg via ORAL
  Filled 2023-01-26 (×4): qty 1

## 2023-01-26 MED ORDER — FUROSEMIDE 10 MG/ML IJ SOLN
60.0000 mg | Freq: Once | INTRAMUSCULAR | Status: AC
  Administered 2023-01-26: 60 mg via INTRAVENOUS
  Filled 2023-01-26: qty 6

## 2023-01-26 MED ORDER — ISOSORBIDE MONONITRATE ER 30 MG PO TB24
30.0000 mg | ORAL_TABLET | Freq: Every day | ORAL | Status: DC
  Administered 2023-01-27: 30 mg via ORAL
  Filled 2023-01-26: qty 1

## 2023-01-26 MED ORDER — INSULIN ASPART 100 UNIT/ML IJ SOLN
0.0000 [IU] | Freq: Three times a day (TID) | INTRAMUSCULAR | Status: DC
  Administered 2023-01-27: 5 [IU] via SUBCUTANEOUS
  Administered 2023-01-27: 1 [IU] via SUBCUTANEOUS
  Administered 2023-01-28 (×2): 3 [IU] via SUBCUTANEOUS
  Administered 2023-01-29: 2 [IU] via SUBCUTANEOUS
  Administered 2023-01-29: 1 [IU] via SUBCUTANEOUS
  Administered 2023-01-29: 2 [IU] via SUBCUTANEOUS

## 2023-01-26 MED ORDER — FLUTICASONE FUROATE-VILANTEROL 200-25 MCG/ACT IN AEPB
1.0000 | INHALATION_SPRAY | Freq: Every day | RESPIRATORY_TRACT | Status: DC | PRN
  Administered 2023-01-28: 1 via RESPIRATORY_TRACT
  Filled 2023-01-26: qty 28

## 2023-01-26 MED ORDER — ONDANSETRON HCL 4 MG/2ML IJ SOLN: 4.0000 mg | Freq: Four times a day (QID) | INTRAMUSCULAR | Status: DC | PRN

## 2023-01-26 MED ORDER — MELATONIN 3 MG PO TABS: 3.0000 mg | ORAL_TABLET | Freq: Every evening | ORAL | Status: DC | PRN

## 2023-01-26 MED ORDER — ONDANSETRON HCL 4 MG PO TABS: 4.0000 mg | ORAL_TABLET | Freq: Four times a day (QID) | ORAL | Status: DC | PRN

## 2023-01-26 MED ORDER — LEVETIRACETAM 500 MG PO TABS
500.0000 mg | ORAL_TABLET | Freq: Two times a day (BID) | ORAL | Status: DC
  Administered 2023-01-26 – 2023-01-30 (×8): 500 mg via ORAL
  Filled 2023-01-26 (×8): qty 1

## 2023-01-26 MED ORDER — FUROSEMIDE 10 MG/ML IJ SOLN: 40.0000 mg | Freq: Two times a day (BID) | INTRAMUSCULAR | Status: DC

## 2023-01-26 MED ORDER — FUROSEMIDE 10 MG/ML IJ SOLN
60.0000 mg | Freq: Two times a day (BID) | INTRAMUSCULAR | Status: DC
  Administered 2023-01-27: 60 mg via INTRAVENOUS
  Filled 2023-01-26: qty 6

## 2023-01-26 MED ORDER — ACETAMINOPHEN 650 MG RE SUPP: 650.0000 mg | Freq: Four times a day (QID) | RECTAL | Status: DC | PRN

## 2023-01-26 NOTE — ED Notes (Addendum)
ED TO INPATIENT HANDOFF REPORT  ED Nurse Name and Phone #: Effie Shy Name/Age/Gender [name] 82 y.o. female Room/Bed: 019C/019C  Code Status   Code Status: Prior  Home/SNF/Other [facility] Patient oriented to: self, place, time, and situation Is this baseline? Yes   Triage Complete: Triage complete  Chief Complaint Volume overload [E87.70]  Triage Note Patient presents to ed via [facility] from [place] , c/o sob onset last pm states she was worse today , normally uses [place] @2$  prn, however wasn't helping denies cough or chest pain. States her blood sugar dropped this am. Patient was placed on NRB @8$  liters  upon arrival to ED patient was sat at 100 ra, placed patient on 2 liters /[place] for comfort.    Allergies Allergies  Allergen Reactions   Chlorhexidine Gluconate Itching and Rash    Level of Care/Admitting Diagnosis ED Disposition     ED Disposition  Admit   Condition  --   Comment  Hospital Area: [facility] [[id number]]  Level of Care: Telemetry [5]  Admit to tele based on following criteria: Acute CHF  May place patient in observation at [facility] or [facility] if equivalent level of care is available:: No  Covid Evaluation: Asymptomatic - no recent exposure (last 10 days) testing not required  Diagnosis: Volume overload [id number]  Admitting Physician: Reubin Milan [id number]  Attending Physician: Reubin Milan [id number]          B Medical/Surgery History Past Medical History:  Diagnosis Date   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   Acute pancreatitis    Arthritis    "back" (09/17/2017)   Atrial flutter ([place])    Cardiomyopathy ([place])    CHF (congestive heart failure) ([place])    Chronic kidney disease, stage II (mild)    Chronic lower back pain    Colon polyps 04/29/2010   TUBULAR ADENOMA AND A SERRATED ADENOMA   COPD (chronic obstructive pulmonary disease) ([place])    CORONARY ARTERY DISEASE 12/24/2007   DIABETES  MELLITUS, TYPE II 07/13/2007   History of blood transfusion 2015   "related to leukemia"   History of kidney stones 12/24/2007   History of uterine cancer    HYPERLIPIDEMIA 12/24/2007   HYPERTENSION 07/13/2007   NSTEMI (non-ST elevated myocardial infarction) ([place]) 09/17/2017   Uterine cancer ([place])    Past Surgical History:  Procedure Laterality Date   ABDOMINAL AORTOGRAM W/LOWER EXTREMITY Bilateral 12/24/2022   Procedure: ABDOMINAL AORTOGRAM W/LOWER EXTREMITY;  Surgeon: Broadus John, MD;  Location: [facility];  Service: Cardiovascular;  Laterality: Bilateral;   ABDOMINAL AORTOGRAM W/LOWER EXTREMITY N/A 01/05/2023   Procedure: ABDOMINAL AORTOGRAM W/LOWER EXTREMITY;  Surgeon: Cherre Robins, MD;  Location: [facility];  Service: Cardiovascular;  Laterality: N/A;   ABDOMINAL HYSTERECTOMY     "still have my ovaries"   AMPUTATION TOE Left 01/03/2023   Procedure: AMPUTATION LEFT GREAT TOE with Partial [place] Amputation;  Surgeon: Edrick Kins, DPM;  Location: [facility];  Service: Podiatry;  Laterality: Left;   BREAST BIOPSY Left 09/22/2019   BREAST LUMPECTOMY Left 10/24/2019   BREAST LUMPECTOMY WITH RADIOACTIVE SEED LOCALIZATION Left 10/24/2019   Procedure: LEFT BREAST LUMPECTOMY WITH RADIOACTIVE SEED LOCALIZATION;  Surgeon: Jovita Kussmaul, MD;  Location: [facility];  Service: General;  Laterality: Left;   CARDIAC CATHETERIZATION  2002   CARDIAC CATHETERIZATION  09/17/2017   CATARACT EXTRACTION W/ INTRAOCULAR LENS  IMPLANT, BILATERAL Bilateral  COLONOSCOPY W/ BIOPSIES AND POLYPECTOMY  2011   CORONARY STENT INTERVENTION N/A 09/21/2017   Procedure: CORONARY STENT INTERVENTION;  Surgeon: Troy Sine, MD;  Location: [facility];  Service: Cardiovascular;  Laterality: N/A;   DILATION AND CURETTAGE OF UTERUS     ESOPHAGOGASTRODUODENOSCOPY N/A 01/30/2022   Procedure: ESOPHAGOGASTRODUODENOSCOPY (EGD);  Surgeon: Milus Banister, MD;  Location: [facility];  Service: Endoscopy;  Laterality: N/A;   EUS N/A 01/30/2022   Procedure: UPPER ENDOSCOPIC ULTRASOUND (EUS) RADIAL;  Surgeon: Milus Banister, MD;  Location: [facility];  Service: Endoscopy;  Laterality: N/A;   EXCISIONAL HEMORRHOIDECTOMY  X 2   FINE NEEDLE ASPIRATION N/A 01/30/2022   Procedure: FINE NEEDLE ASPIRATION (FNA) LINEAR;  Surgeon: Milus Banister, MD;  Location: [facility];  Service: Endoscopy;  Laterality: N/A;   GANGLION CYST EXCISION Left X 3   INTRAMEDULLARY (IM) NAIL INTERTROCHANTERIC Right 08/30/2022   Procedure: INTRAMEDULLARY (IM) NAIL INTERTROCHANTERIC;  Surgeon: Altamese Farmer, MD;  Location: [facility];  Service: Orthopedics;  Laterality: Right;   INTRAVASCULAR PRESSURE WIRE/FFR STUDY N/A 09/17/2017   Procedure: INTRAVASCULAR PRESSURE WIRE/FFR STUDY;  Surgeon: Nelva Bush, MD;  Location: [facility];  Service: Cardiovascular;  Laterality: N/A;   IR THORACENTESIS ASP PLEURAL SPACE W/IMG GUIDE  10/27/2022   NASAL SINUS SURGERY     PERIPHERAL VASCULAR BALLOON ANGIOPLASTY  12/24/2022   Procedure: PERIPHERAL VASCULAR BALLOON ANGIOPLASTY;  Surgeon: Broadus John, MD;  Location: [facility];  Service: Cardiovascular;;  Lt. SFA   PERIPHERAL VASCULAR INTERVENTION  01/05/2023   Procedure: PERIPHERAL VASCULAR INTERVENTION;  Surgeon: Cherre Robins, MD;  Location: [facility];  Service: Cardiovascular;;   PORTA CATH INSERTION Right 2013   RIGHT/LEFT HEART CATH AND CORONARY ANGIOGRAPHY N/A 09/17/2017   Procedure: RIGHT/LEFT HEART CATH AND CORONARY ANGIOGRAPHY;  Surgeon: Nelva Bush, MD;  Location: [facility];  Service: Cardiovascular;  Laterality: N/A;   TEE WITHOUT CARDIOVERSION N/A 11/19/2022   Procedure: TRANSESOPHAGEAL ECHOCARDIOGRAM (TEE);  Surgeon: Hebert Soho, DO;  Location: [facility];  Service: Cardiovascular;  Laterality: N/A;   TUBAL LIGATION       A IV Location/Drains/Wounds Patient Lines/Drains/Airways Status      Active Line/Drains/Airways     Name Placement date Placement time Site Days   Implanted Port Right Chest --  --  Chest  --   External Urinary Catheter 12/13/22  1000  --  44   Wound / Incision (Open or Dehisced) 12/07/22 Irritant Dermatitis (Moisture Associated Skin Damage) Abdomen Anterior;Lower MASD/yeast to abdominal fold 12/07/22  2200  Abdomen  50            Intake/Output Last 24 hours No intake or output data in the 24 hours ending 01/30/2023 1638  Labs/Imaging Results for orders placed or performed during the hospital encounter of 01/18/2023 (from the past 48 hour(s))  Comprehensive metabolic panel     Status: Abnormal   Collection Time: 01/24/2023 11:45 AM  Result Value Ref Range   Sodium 141 135 - 145 mmol/L   Potassium 4.8 3.5 - 5.1 mmol/L   Chloride 109 98 - 111 mmol/L   CO2 22 22 - 32 mmol/L   Glucose, Bld 103 (H) 70 - 99 mg/dL    Comment: Glucose reference range applies only to samples taken after fasting for at least 8 hours.   BUN 49 (H) 8 - 23 mg/dL   Creatinine, Ser 2.13 (H) 0.44 - 1.00 mg/dL   Calcium 7.1 (  L) 8.9 - 10.3 mg/dL   Total Protein 5.0 (L) 6.5 - 8.1 g/dL   Albumin 2.0 (L) 3.5 - 5.0 g/dL   AST 16 15 - 41 U/L   ALT 11 0 - 44 U/L   Alkaline Phosphatase 106 38 - 126 U/L   Total Bilirubin 0.5 0.3 - 1.2 mg/dL   GFR, Estimated 23 (L) >60 mL/min    Comment: (NOTE) Calculated using the CKD-EPI Creatinine Equation (2021)    Anion gap 10 5 - 15    Comment: Performed at [facility] [address] [place], [place] [address]  Lactic acid, plasma     Status: None   Collection Time: 01/18/2023 11:45 AM  Result Value Ref Range   Lactic Acid, Venous 0.9 0.5 - 1.9 mmol/L    Comment: Performed at [facility] [address], [place], [place] [address]  CBC with Differential     Status: Abnormal   Collection Time: 01/19/2023 11:45 AM  Result Value Ref Range   WBC 4.0 4.0 - 10.5 K/uL   RBC 2.17 (L) 3.87 - 5.11 MIL/uL   Hemoglobin 7.0 (L) 12.0 -  15.0 g/dL   HCT 22.2 (L) 36.0 - 46.0 %   MCV 102.3 (H) 80.0 - 100.0 fL   MCH 32.3 26.0 - 34.0 pg   MCHC 31.5 30.0 - 36.0 g/dL   RDW 17.3 (H) 11.5 - 15.5 %   Platelets 141 (L) 150 - 400 K/uL   nRBC 0.0 0.0 - 0.2 %   Neutrophils Relative % 75 %   Neutro Abs 3.0 1.7 - 7.7 K/uL   Lymphocytes Relative 14 %   Lymphs Abs 0.5 (L) 0.7 - 4.0 K/uL   Monocytes Relative 8 %   Monocytes Absolute 0.3 0.1 - 1.0 K/uL   Eosinophils Relative 2 %   Eosinophils Absolute 0.1 0.0 - 0.5 K/uL   Basophils Relative 0 %   Basophils Absolute 0.0 0.0 - 0.1 K/uL   Immature Granulocytes 1 %   Abs Immature Granulocytes 0.02 0.00 - 0.07 K/uL    Comment: Performed at [facility], [place] [address], [place], [place] [address]  CBG monitoring, ED     Status: None   Collection Time: 01/21/2023  3:30 PM  Result Value Ref Range   Glucose-Capillary 76 70 - 99 mg/dL    Comment: Glucose reference range applies only to samples taken after fasting for at least 8 hours.  Lactic acid, plasma     Status: None   Collection Time: 01/31/2023  3:40 PM  Result Value Ref Range   Lactic Acid, Venous 0.6 0.5 - 1.9 mmol/L    Comment: Performed at [facility], [place] [address], [place], [place] [address]   *Note: Due to a large number of results and/or encounters for the requested time period, some results have not been displayed. A complete set of results can be found in Results Review.   DG Chest 2 View  Result Date: 01/19/2023 CLINICAL DATA:  Shortness of breath EXAM: CHEST - 2 VIEW COMPARISON:  01/01/2023 x-ray FINDINGS: Underinflation. Once again there is an enlarged cardiopericardial silhouette with a calcified aorta and right upper chest port. Increasing small right effusion right-greater-than-left and vascular congestion. No pneumothorax. Overlapping cardiac leads. IMPRESSION: Worsening inflation with increasing small effusions and opacities. Recommend follow-up Electronically Signed   By: Jill Side M.D.   On: 01/14/2023  13:23    Pending Labs Unresulted Labs (From admission, onward)  Start     Ordered   01/23/2023 1111  Resp panel by RT-PCR (RSV, Flu A&B, Covid) Anterior Nasal Swab  (Tier 2 - SymptomaticResp panel by RT-PCR (RSV, Flu A&B, Covid))  Once,   URGENT        01/09/2023 1110   01/11/2023 1110  Brain natriuretic peptide  Once,   URGENT        01/10/2023 1110            Vitals/Pain Today's Vitals   01/17/2023 1045 01/31/2023 1051 01/24/2023 1145 01/24/2023 1518  BP: (!) 116/103 (!) 116/103 (!) 143/54 133/60  Pulse: (!) 122 64 74 95  Resp: 19 20 (!) 24 18  Temp:  (!) 97 F (36.1 C)  97.9 F (36.6 C)  TempSrc:  Oral  Oral  SpO2: (!) 72% 100% 100% 100%  Weight:  71.2 kg    Height:  5' 1"$  (1.549 m)    PainSc:  0-No pain      Isolation Precautions No active isolations  Medications Medications  furosemide (LASIX) injection 60 mg (60 mg Intravenous Given 01/22/2023 1617)    Mobility walks with device     Focused Assessments    R Recommendations: See Admitting Provider Note  Report given to:   Additional Notes:  left toe amputation 3 weeks ago. Port accessed

## 2023-01-26 NOTE — ED Triage Notes (Signed)
Patient presents to ed via [facility] from [place] , c/o sob onset last pm states she was worse today , normally uses [facility] @2$  prn, however wasn't helping denies cough or chest pain. States her blood sugar dropped this am. Patient was placed on NRB @8$  liters  upon arrival to ED patient was sat at 100 ra, placed patient on 2 liters / for comfort.

## 2023-01-26 NOTE — ED Notes (Signed)
Pt complaining of left CP, EKG completed. MD [place] notified

## 2023-01-26 NOTE — ED Notes (Signed)
[facility] transfer was called, and then canceled per charge jamie the pt can not go to [facility]

## 2023-01-26 NOTE — ED Provider Notes (Signed)
[facility] Provider Note   CSN: [id number] Arrival date & time: 01/10/2023  1036     History  Chief Complaint  Patient presents with   ascites/SOB    NAZARET ARRELLANO is a 82 y.o. female.  HPI Patient presents with her sister who assists with history.  In essence the patient has had dyspnea for some time, is a nursing home resident, is oxygen dependent.  She has known vasculopathy, had left great toe amputation last year.  She presents today due to worsening dyspnea over the past day or so.  She is unsure of actual weight gain, notes that she has increased swelling in her lower extremities, torso.  No chest pain, no syncope, no fever.  She has been compliant with all medication.    Home Medications Prior to Admission medications   Medication Sig Start Date End Date Taking? Authorizing Provider  apixaban (ELIQUIS) 2.5 MG TABS tablet Take 1 tablet (2.5 mg total) by mouth 2 (two) times daily. 09/30/22   Angiulli, Lavon Paganini, PA-C  ascorbic acid (VITAMIN C) 1000 MG tablet Take 1 tablet (1,000 mg total) by mouth daily. 09/30/22   Angiulli, Lavon Paganini, PA-C  atorvastatin (LIPITOR) 40 MG tablet Take 1 tablet (40 mg total) by mouth daily. 09/30/22   Angiulli, Lavon Paganini, PA-C  calcium acetate (PHOSLO) 667 MG capsule Take 2 capsules (1,334 mg total) by mouth 3 (three) times daily with meals. 12/25/22   Bonnielee Haff, MD  calcium carbonate (OS-CAL - DOSED IN MG OF ELEMENTAL CALCIUM) 1250 (500 Ca) MG tablet Take 1 tablet (1,250 mg total) by mouth 2 (two) times daily with a meal. 01/09/23   Thurnell Lose, MD  cephALEXin (KEFLEX) 500 MG capsule Take 1 capsule (500 mg total) by mouth 3 (three) times daily. 01/14/23   Edrick Kins, DPM  clopidogrel (PLAVIX) 75 MG tablet Take 1 tablet (75 mg total) by mouth every evening. 09/30/22   Angiulli, Lavon Paganini, PA-C  cyanocobalamin (VITAMIN B12) 1000 MCG/ML injection INJECT 1ML IN THE MUSCLE EVERY 30 DAYS AS  DIRECTED Patient taking differently: Inject 1,000 mcg into the muscle every 30 (thirty) days. 08/28/22   Isaac Bliss, Rayford Halsted, MD  docusate sodium (COLACE) 100 MG capsule Take 1 capsule (100 mg total) by mouth 2 (two) times daily. 09/30/22   Angiulli, Lavon Paganini, PA-C  ferrous sulfate 325 (65 FE) MG tablet Take 1 tablet (325 mg total) by mouth 2 (two) times daily with a meal. 01/09/23   Thurnell Lose, MD  fluticasone furoate-vilanterol (BREO ELLIPTA) 200-25 MCG/ACT AEPB Inhale 1 puff into the lungs as needed (wheezing SOB). 09/30/22   Angiulli, Lavon Paganini, PA-C  folic acid (FOLVITE) 1 MG tablet Take 1 tablet (1 mg total) by mouth daily. 01/10/23   Thurnell Lose, MD  furosemide (LASIX) 80 MG tablet Take 1 tablet (80 mg total) by mouth 2 (two) times daily. 01/09/23   Thurnell Lose, MD  gabapentin (NEURONTIN) 100 MG capsule Take 100 mg by mouth 3 (three) times daily.    [provider]  insulin aspart (NOVOLOG) 100 UNIT/ML FlexPen Before each meal 3 times a day, 140-199 - 2 units, 200-250 - 4 units, 251-299 - 6 units,  300-349 - 8 units,  350 or above 10 units. Insulin PEN if approved, provide syringes and needles if needed.Please switch to any approved short acting Insulin if needed. 01/09/23   Thurnell Lose, MD  isosorbide mononitrate (IMDUR) 30  MG 24 hr tablet Take 1 tablet (30 mg total) by mouth daily. 09/30/22   Angiulli, Lavon Paganini, PA-C  letrozole [facility]) 2.5 MG tablet Take 1 tablet (2.5 mg total) by mouth daily. 02/14/22   Owens Shark, NP  levETIRAcetam (KEPPRA) 500 MG tablet Take 1 tablet (500 mg total) by mouth at bedtime. 01/22/23 01/17/24  Alric Ran, MD  LORazepam (ATIVAN) 0.5 MG tablet Take 1 tablet (0.5 mg total) by mouth every 6 (six) hours as needed for anxiety. 01/09/23   Thurnell Lose, MD  melatonin 3 MG TABS tablet Take 1 tablet (3 mg total) by mouth at bedtime. Patient taking differently: Take 3 mg by mouth at bedtime as needed (sleep). 09/30/22   Angiulli,  Lavon Paganini, PA-C  methocarbamol (ROBAXIN) 500 MG tablet Take 1 tablet (500 mg total) by mouth every 4 (four) hours as needed for muscle spasms. 09/30/22   Angiulli, Lavon Paganini, PA-C  metoprolol succinate (TOPROL-XL) 25 MG 24 hr tablet Take 0.5 tablets (12.5 mg total) by mouth daily. Take with or immediately following a meal. 09/30/22   Angiulli, Lavon Paganini, PA-C  Multiple Vitamin (MULTIVITAMIN WITH MINERALS) TABS tablet Take 1 tablet by mouth daily. 09/30/22   Angiulli, Lavon Paganini, PA-C  nitroGLYCERIN (NITROSTAT) 0.4 MG SL tablet Place 0.4 mg under the tongue every 5 (five) minutes as needed for chest pain. 11/08/22   [provider]  oxyCODONE-acetaminophen (PERCOCET/ROXICET) 5-325 MG tablet Take 1-2 tablets by mouth every 6 (six) hours as needed for severe pain. 01/08/23   Thurnell Lose, MD  polyethylene glycol (MIRALAX / GLYCOLAX) 17 g packet Take 17 g by mouth daily. 09/30/22   Angiulli, Lavon Paganini, PA-C  potassium chloride 20 MEQ TBCR Take 0.5 tablets (10 mEq total) by mouth 2 (two) times daily. 01/09/23   Thurnell Lose, MD  psyllium (HYDROCIL/METAMUCIL) 95 % PACK Take 1 packet by mouth daily. 09/30/22   Angiulli, Lavon Paganini, PA-C  Vitamin D, Ergocalciferol, (DRISDOL) 1.25 MG (50000 UNIT) CAPS capsule Take 1 capsule (50,000 Units total) by mouth every 7 (seven) days. 12/31/22   Bonnielee Haff, MD      Allergies    Chlorhexidine gluconate    Review of Systems   Review of Systems  All other systems reviewed and are negative.   Physical Exam Updated Vital Signs BP (!) 116/103 (BP Location: Right Arm)   Pulse 64   Temp (!) 97 F (36.1 C) (Oral)   Resp 20   Ht 5' 1"$  (1.549 m)   Wt 71.2 kg   SpO2 100%   BMI 29.66 kg/m  Physical Exam Vitals and nursing note reviewed.  Constitutional:      General: She is not in acute distress.    Appearance: She is well-developed.     Comments: Uncomfortable appearing elderly female in no distress  HENT:     Head: Normocephalic and atraumatic.   Eyes:     Conjunctiva/sclera: Conjunctivae normal.  Cardiovascular:     Rate and Rhythm: Normal rate and regular rhythm.  Pulmonary:     Effort: Pulmonary effort is normal. No respiratory distress.     Breath sounds: Normal breath sounds. No stridor.     Comments: Diminished breath sounds, patient now on 3 L via nasal cannula as opposed to 2 L at baseline. Abdominal:     General: There is no distension.  Musculoskeletal:     Right lower leg: Edema present.     Left lower leg: Edema present.  Comments: Left great toe status post amputation, surgical bed appears clean, dry, intact. Left forearm with compression dressing in place due to weeping, no changes according to the patient.  No distal or proximal erythema.  Skin:    General: Skin is warm and dry.  Neurological:     Mental Status: She is alert and oriented to person, place, and time.     Cranial Nerves: No cranial nerve deficit.  Psychiatric:        Mood and Affect: Mood normal.     ED Results / Procedures / Treatments   Labs (all labs ordered are listed, but only abnormal results are displayed) Labs Reviewed  RESP PANEL BY RT-PCR (RSV, FLU A&B, COVID)  [id number]  COMPREHENSIVE METABOLIC PANEL  LACTIC ACID, PLASMA  LACTIC ACID, PLASMA  CBC WITH DIFFERENTIAL/PLATELET  BRAIN NATRIURETIC PEPTIDE    EKG None  Radiology No results found.  Procedures Procedures    Medications Ordered in ED Medications - No data to display  ED Course/ Medical Decision Making/ A&P Clinical Course as of 01/25/2023 1138  Mon Jan 26, 2023  1112 DG Chest 2 View [SH]    Clinical Course User Index [SH] Sherral Hammers, [place]                             Medical Decision Making Patient with multiple medical problems including diabetes, hypertension, home oxygen dependency presents with dyspnea, edema.  Differential includes worsening anasarca, cirrhosis, heart failure, infection, bacteremia, sepsis.  Cardiac monitor 6o's A-fib  abnormal Pulse ox 100% on 3 L nasal cannula abnormal   Amount and/or Complexity of Data Reviewed Independent Historian:     Details: Sister at bedside assists with HPI. External Data Reviewed: notes.    Details: Surgical notes from amputation and hospitalization noted Labs: ordered. Decision-making details documented in ED Course. Radiology: ordered and independent interpretation performed. Decision-making details documented in ED Course. ECG/medicine tests: ordered and independent interpretation performed. Decision-making details documented in ED Course.  Risk Prescription drug management.  Echocardiogram from last month included below: IMPRESSIONS     1. Left ventricular ejection fraction, by estimation, is 60 to 65%. The  left ventricle has normal function. The left ventricle has no regional  wall motion abnormalities. There is mild left ventricular hypertrophy.  Left ventricular diastolic parameters  are indeterminate.   2. Right ventricular systolic function is normal. The right ventricular  size is normal. There is mildly elevated pulmonary artery systolic  pressure.   3. Left atrial size was moderately dilated.   4. The mitral valve is degenerative. Mild mitral valve regurgitation. No  evidence of mitral stenosis. Severe mitral annular calcification.   5. Tricuspid valve regurgitation is moderate to severe.   6. The aortic valve is normal in structure. There is severe calcifcation  of the aortic valve. There is severe thickening of the aortic valve.  Aortic valve regurgitation is not visualized. Mild to moderate aortic  valve stenosis.   7. The inferior vena cava is normal in size with greater than 50%  respiratory variability, suggesting right atrial pressure of 3 mmHg.    4:14 PM Patient awake and alert.  She continues to require supplemental oxygen increased past baseline, she is awaiting additional labs including lactic acid, BNP, has been provided IV Lasix, will  likely require admission pending those lab results.          Final Clinical Impression(s) / ED Diagnoses Final diagnoses:  SOB (shortness of breath)    Rx / DC Orders ED Discharge Orders     None         Carmin Muskrat, MD 02/02/2023 [phone]

## 2023-01-26 NOTE — H&P (Signed)
History and Physical    Patient: [name] [id number] DOB: Oct 24, 1941 DOA: 01/16/2023 DOS: the patient was seen and examined on 01/25/2023 PCP: Isaac Bliss, Rayford Halsted, MD  Patient coming from: SNF  Chief Complaint:  Chief Complaint  Patient presents with   ascites/SOB   HPI: [name] is a 82 y.o. female with medical history significant of AML, pancreatitis, atrial flutter, unspecified cardiomyopathy, CHF, CAD, history of NSTEMI, hypertension, stage IV CKD, chronic lower back pain, colon polyps, COPD on oxygen, type 2 diabetes, nephrolithiasis, uterine cancer, hyperlipidemia, left great toe amputation who is coming from her skilled nursing facility to the emergency department due to dyspnea and ascites. She denied fever, chills, rhinorrhea, sore throat, wheezing or hemoptysis.  No chest pain, palpitations, diaphoresis, PND, but positive orthopnea, ascites and bilateral pitting edema of the lower extremities.  No abdominal pain, nausea, emesis, diarrhea, melena or hematochezia.  She gets occasionally constipated.  No flank pain, dysuria, frequency or hematuria.  No polyuria, polydipsia, polyphagia or blurred vision.  Lab work: Her CBC is her white count of 4.0, hemoglobin 7.0 g/dL platelets 141.  Lactic acid x 2 normal.  CMP showed normal electrolytes after calcium correction.  Glucose 103, BUN 49 and creatinine 2.13 mg/dL.  Total protein was 5.0 and albumin 2.0 g/dL.  The rest of the LFTs were unremarkable.  Imaging: 2 view chest radiograph showing cardiomegaly, worsening inflation with increase in small effusions and opacities bilaterally.   ED course: Initial vital signs were temperature 97 F, pulse 122, respiration 19, BP 116/103 mmHg O2 sat 72% on room air.  Current O2 sat is 100% on nasal cannula 2-1/2 LPM.  She received furosemide 60 mg IVP x 1.  Review of Systems: As mentioned in the history of present illness. All other systems reviewed and are negative. Past Medical  History:  Diagnosis Date   Acute myelogenous leukemia ([place]) 02/2014   Acute pancreatitis    Arthritis    "back" (09/17/2017)   Atrial flutter (HCC)    Cardiomyopathy (HCC)    CHF (congestive heart failure) (HCC)    Chronic kidney disease, stage II (mild)    Chronic lower back pain    Colon polyps 04/29/2010   TUBULAR ADENOMA AND A SERRATED ADENOMA   COPD (chronic obstructive pulmonary disease) ([place])    CORONARY ARTERY DISEASE 12/24/2007   DIABETES MELLITUS, TYPE II 07/13/2007   History of blood transfusion 2015   "related to leukemia"   History of kidney stones 12/24/2007   History of uterine cancer    HYPERLIPIDEMIA 12/24/2007   HYPERTENSION 07/13/2007   NSTEMI (non-ST elevated myocardial infarction) ([place]) 09/17/2017   Uterine cancer ([place])    Past Surgical History:  Procedure Laterality Date   ABDOMINAL AORTOGRAM W/LOWER EXTREMITY Bilateral 12/24/2022   Procedure: ABDOMINAL AORTOGRAM W/LOWER EXTREMITY;  Surgeon: Broadus John, MD;  Location: [facility];  Service: Cardiovascular;  Laterality: Bilateral;   ABDOMINAL AORTOGRAM W/LOWER EXTREMITY N/A 01/05/2023   Procedure: ABDOMINAL AORTOGRAM W/LOWER EXTREMITY;  Surgeon: Cherre Robins, MD;  Location: [facility];  Service: Cardiovascular;  Laterality: N/A;   ABDOMINAL HYSTERECTOMY     "still have my ovaries"   AMPUTATION TOE Left 01/03/2023   Procedure: AMPUTATION LEFT GREAT TOE with Partial [place] Amputation;  Surgeon: Edrick Kins, DPM;  Location: [facility];  Service: Podiatry;  Laterality: Left;   BREAST BIOPSY Left 09/22/2019   BREAST LUMPECTOMY Left 10/24/2019   BREAST LUMPECTOMY WITH RADIOACTIVE SEED LOCALIZATION  Left 10/24/2019   Procedure: LEFT BREAST LUMPECTOMY WITH RADIOACTIVE SEED LOCALIZATION;  Surgeon: Jovita Kussmaul, MD;  Location: [facility];  Service: General;  Laterality: Left;   CARDIAC CATHETERIZATION  2002   CARDIAC CATHETERIZATION  09/17/2017   CATARACT EXTRACTION W/  INTRAOCULAR LENS  IMPLANT, BILATERAL Bilateral    COLONOSCOPY W/ BIOPSIES AND POLYPECTOMY  2011   CORONARY STENT INTERVENTION N/A 09/21/2017   Procedure: CORONARY STENT INTERVENTION;  Surgeon: Troy Sine, MD;  Location: [facility];  Service: Cardiovascular;  Laterality: N/A;   DILATION AND CURETTAGE OF UTERUS     ESOPHAGOGASTRODUODENOSCOPY N/A 01/30/2022   Procedure: ESOPHAGOGASTRODUODENOSCOPY (EGD);  Surgeon: Milus Banister, MD;  Location: [facility];  Service: Endoscopy;  Laterality: N/A;   EUS N/A 01/30/2022   Procedure: UPPER ENDOSCOPIC ULTRASOUND (EUS) RADIAL;  Surgeon: Milus Banister, MD;  Location: [facility];  Service: Endoscopy;  Laterality: N/A;   EXCISIONAL HEMORRHOIDECTOMY  X 2   FINE NEEDLE ASPIRATION N/A 01/30/2022   Procedure: FINE NEEDLE ASPIRATION (FNA) LINEAR;  Surgeon: Milus Banister, MD;  Location: [facility];  Service: Endoscopy;  Laterality: N/A;   GANGLION CYST EXCISION Left X 3   INTRAMEDULLARY (IM) NAIL INTERTROCHANTERIC Right 08/30/2022   Procedure: INTRAMEDULLARY (IM) NAIL INTERTROCHANTERIC;  Surgeon: Altamese Radisson, MD;  Location: [facility];  Service: Orthopedics;  Laterality: Right;   INTRAVASCULAR PRESSURE WIRE/FFR STUDY N/A 09/17/2017   Procedure: INTRAVASCULAR PRESSURE WIRE/FFR STUDY;  Surgeon: Nelva Bush, MD;  Location: [facility];  Service: Cardiovascular;  Laterality: N/A;   IR THORACENTESIS ASP PLEURAL SPACE W/IMG GUIDE  10/27/2022   NASAL SINUS SURGERY     PERIPHERAL VASCULAR BALLOON ANGIOPLASTY  12/24/2022   Procedure: PERIPHERAL VASCULAR BALLOON ANGIOPLASTY;  Surgeon: Broadus John, MD;  Location: [facility];  Service: Cardiovascular;;  Lt. SFA   PERIPHERAL VASCULAR INTERVENTION  01/05/2023   Procedure: PERIPHERAL VASCULAR INTERVENTION;  Surgeon: Cherre Robins, MD;  Location: [facility];  Service: Cardiovascular;;   PORTA CATH INSERTION Right 2013   RIGHT/LEFT HEART CATH AND CORONARY ANGIOGRAPHY N/A  09/17/2017   Procedure: RIGHT/LEFT HEART CATH AND CORONARY ANGIOGRAPHY;  Surgeon: Nelva Bush, MD;  Location: [facility];  Service: Cardiovascular;  Laterality: N/A;   TEE WITHOUT CARDIOVERSION N/A 11/19/2022   Procedure: TRANSESOPHAGEAL ECHOCARDIOGRAM (TEE);  Surgeon: Hebert Soho, DO;  Location: [facility];  Service: Cardiovascular;  Laterality: N/A;   TUBAL LIGATION     Social History:  reports that she quit smoking about 22 years ago. Her smoking use included cigarettes. She has a 20.00 pack-year smoking history. She has never used smokeless tobacco. She reports that she does not drink alcohol and does not use drugs.  Allergies  Allergen Reactions   Chlorhexidine Gluconate Itching and Rash    Family History  Problem Relation Age of Onset   COPD Father    Esophageal cancer Father    Breast cancer Sister 42   Lung cancer Sister    Esophageal cancer Brother    Suicidality Brother    Lung cancer Sister    Lung cancer Sister    Cervical cancer Sister    Congestive Heart Failure Mother    Heart attack Maternal Grandmother    Skin cancer Maternal Grandfather    Heart attack Maternal Grandfather    Stroke Paternal Grandmother 34   Heart attack Paternal Grandfather    Cancer Maternal Uncle        unknown type, dx. >  32   Cancer Paternal Aunt        unknown type, dx. >50   Cancer Paternal Uncle        unknown type, dx. >50   Cirrhosis Maternal Uncle    Skin cancer Daughter    Leukemia Cousin        maternal first cousin; dx. >52, in remission   Cancer Niece        unknown type behind her ear, dx. 30s/40s   Colon cancer Neg Hx     Prior to Admission medications   Medication Sig Start Date End Date Taking? Authorizing Provider  apixaban (ELIQUIS) 2.5 MG TABS tablet Take 1 tablet (2.5 mg total) by mouth 2 (two) times daily. 09/30/22   Angiulli, Lavon Paganini, PA-C  ascorbic acid (VITAMIN C) 1000 MG tablet Take 1 tablet (1,000 mg total) by mouth daily. 09/30/22    Angiulli, Lavon Paganini, PA-C  atorvastatin (LIPITOR) 40 MG tablet Take 1 tablet (40 mg total) by mouth daily. 09/30/22   Angiulli, Lavon Paganini, PA-C  calcium acetate (PHOSLO) 667 MG capsule Take 2 capsules (1,334 mg total) by mouth 3 (three) times daily with meals. 12/25/22   Bonnielee Haff, MD  calcium carbonate (OS-CAL - DOSED IN MG OF ELEMENTAL CALCIUM) 1250 (500 Ca) MG tablet Take 1 tablet (1,250 mg total) by mouth 2 (two) times daily with a meal. 01/09/23   Thurnell Lose, MD  cephALEXin (KEFLEX) 500 MG capsule Take 1 capsule (500 mg total) by mouth 3 (three) times daily. 01/14/23   Edrick Kins, DPM  clopidogrel (PLAVIX) 75 MG tablet Take 1 tablet (75 mg total) by mouth every evening. 09/30/22   Angiulli, Lavon Paganini, PA-C  cyanocobalamin (VITAMIN B12) 1000 MCG/ML injection INJECT 1ML IN THE MUSCLE EVERY 30 DAYS AS DIRECTED Patient taking differently: Inject 1,000 mcg into the muscle every 30 (thirty) days. 08/28/22   Isaac Bliss, Rayford Halsted, MD  docusate sodium (COLACE) 100 MG capsule Take 1 capsule (100 mg total) by mouth 2 (two) times daily. 09/30/22   Angiulli, Lavon Paganini, PA-C  ferrous sulfate 325 (65 FE) MG tablet Take 1 tablet (325 mg total) by mouth 2 (two) times daily with a meal. 01/09/23   Thurnell Lose, MD  fluticasone furoate-vilanterol (BREO ELLIPTA) 200-25 MCG/ACT AEPB Inhale 1 puff into the lungs as needed (wheezing SOB). 09/30/22   Angiulli, Lavon Paganini, PA-C  folic acid (FOLVITE) 1 MG tablet Take 1 tablet (1 mg total) by mouth daily. 01/10/23   Thurnell Lose, MD  furosemide (LASIX) 80 MG tablet Take 1 tablet (80 mg total) by mouth 2 (two) times daily. 01/09/23   Thurnell Lose, MD  gabapentin (NEURONTIN) 100 MG capsule Take 100 mg by mouth 3 (three) times daily.    [provider]  insulin aspart (NOVOLOG) 100 UNIT/ML FlexPen Before each meal 3 times a day, 140-199 - 2 units, 200-250 - 4 units, 251-299 - 6 units,  300-349 - 8 units,  350 or above 10 units. Insulin PEN if  approved, provide syringes and needles if needed.Please switch to any approved short acting Insulin if needed. 01/09/23   Thurnell Lose, MD  isosorbide mononitrate (IMDUR) 30 MG 24 hr tablet Take 1 tablet (30 mg total) by mouth daily. 09/30/22   Angiulli, Lavon Paganini, PA-C  letrozole [facility]) 2.5 MG tablet Take 1 tablet (2.5 mg total) by mouth daily. 02/14/22   Owens Shark, NP  levETIRAcetam (KEPPRA) 500 MG tablet Take 1 tablet (  500 mg total) by mouth at bedtime. 01/22/23 01/17/24  Alric Ran, MD  LORazepam (ATIVAN) 0.5 MG tablet Take 1 tablet (0.5 mg total) by mouth every 6 (six) hours as needed for anxiety. 01/09/23   Thurnell Lose, MD  melatonin 3 MG TABS tablet Take 1 tablet (3 mg total) by mouth at bedtime. Patient taking differently: Take 3 mg by mouth at bedtime as needed (sleep). 09/30/22   Angiulli, Lavon Paganini, PA-C  methocarbamol (ROBAXIN) 500 MG tablet Take 1 tablet (500 mg total) by mouth every 4 (four) hours as needed for muscle spasms. 09/30/22   Angiulli, Lavon Paganini, PA-C  metoprolol succinate (TOPROL-XL) 25 MG 24 hr tablet Take 0.5 tablets (12.5 mg total) by mouth daily. Take with or immediately following a meal. 09/30/22   Angiulli, Lavon Paganini, PA-C  Multiple Vitamin (MULTIVITAMIN WITH MINERALS) TABS tablet Take 1 tablet by mouth daily. 09/30/22   Angiulli, Lavon Paganini, PA-C  nitroGLYCERIN (NITROSTAT) 0.4 MG SL tablet Place 0.4 mg under the tongue every 5 (five) minutes as needed for chest pain. 11/08/22   [provider]  oxyCODONE-acetaminophen (PERCOCET/ROXICET) 5-325 MG tablet Take 1-2 tablets by mouth every 6 (six) hours as needed for severe pain. 01/08/23   Thurnell Lose, MD  polyethylene glycol (MIRALAX / GLYCOLAX) 17 g packet Take 17 g by mouth daily. 09/30/22   Angiulli, Lavon Paganini, PA-C  potassium chloride 20 MEQ TBCR Take 0.5 tablets (10 mEq total) by mouth 2 (two) times daily. 01/09/23   Thurnell Lose, MD  psyllium (HYDROCIL/METAMUCIL) 95 % PACK Take 1 packet by mouth  daily. 09/30/22   Angiulli, Lavon Paganini, PA-C  Vitamin D, Ergocalciferol, (DRISDOL) 1.25 MG (50000 UNIT) CAPS capsule Take 1 capsule (50,000 Units total) by mouth every 7 (seven) days. 12/31/22   Bonnielee Haff, MD    Physical Exam: Vitals:   01/31/2023 1045 01/29/2023 1051 01/19/2023 1145 01/21/2023 1518  BP: (!) 116/103 (!) 116/103 (!) 143/54 133/60  Pulse: (!) 122 64 74 95  Resp: 19 20 (!) 24 18  Temp:  (!) 97 F (36.1 C)  97.9 F (36.6 C)  TempSrc:  Oral  Oral  SpO2: (!) 72% 100% 100% 100%  Weight:  71.2 kg    Height:  5' 1"$  (1.549 m)     Physical Exam Vitals and nursing note reviewed.  Constitutional:      General: She is awake. She is not in acute distress.    Appearance: She is obese.  HENT:     Head: Normocephalic.     Nose: No rhinorrhea.     Mouth/Throat:     Mouth: Mucous membranes are moist.  Eyes:     General: No scleral icterus.    Pupils: Pupils are equal, round, and reactive to light.  Neck:     Vascular: No JVD.  Cardiovascular:     Rate and Rhythm: Normal rate and regular rhythm.     Heart sounds: S1 normal and S2 normal.  Pulmonary:     Breath sounds: Wheezing and rales present.  Abdominal:     General: Bowel sounds are normal. There is no distension.     Palpations: Abdomen is soft.     Tenderness: There is no abdominal tenderness. There is no right CVA tenderness, left CVA tenderness or guarding.  Musculoskeletal:     Cervical back: Neck supple.     Right lower leg: 4+ Pitting Edema present.     Left lower leg: 4+ Pitting Edema present.  Skin:    General: Skin is warm and dry.  Neurological:     General: No focal deficit present.     Mental Status: She is alert and oriented to person, place, and time.  Psychiatric:        Mood and Affect: Mood normal.        Behavior: Behavior normal. Behavior is cooperative.    Data Reviewed:  Results are pending, will review when available. Echocardiogram TTE 10/16/2022 IMPRESSIONS:   1. Left ventricular  ejection fraction, by estimation, is 60 to 65%. The  left ventricle has normal function. The left ventricle has no regional  wall motion abnormalities. There is mild left ventricular hypertrophy.  Left ventricular diastolic parameters  are indeterminate.   2. Right ventricular systolic function is normal. The right ventricular  size is normal. There is mildly elevated pulmonary artery systolic  pressure.   3. Left atrial size was moderately dilated.   4. The mitral valve is degenerative. Mild mitral valve regurgitation. No  evidence of mitral stenosis. Severe mitral annular calcification.   5. Tricuspid valve regurgitation is moderate to severe.   6. The aortic valve is normal in structure. There is severe calcifcation  of the aortic valve. There is severe thickening of the aortic valve.  Aortic valve regurgitation is not visualized. Mild to moderate aortic  valve stenosis.   7. The inferior vena cava is normal in size with greater than 50%  respiratory variability, suggesting right atrial pressure of 3 mmHg.   Echocardiogram TEE 12/09/2022 IMPRESSIONS:   1. Left ventricular ejection fraction, by estimation, is 60 to 65%. The  left ventricle has normal function. Left ventricular diastolic function  could not be evaluated.   2. Right ventricular systolic function is normal. The right ventricular  size is normal.   3. Left atrial size was mild to moderately dilated. No left atrial/left  atrial appendage thrombus was detected.   4. The mitral valve is normal in structure. Trivial mitral valve  regurgitation.   5. Tricuspid valve regurgitation is moderate.   6. The aortic valve is calcified. There is moderate calcification of the  aortic valve. Aortic valve regurgitation is not visualized.   7. There is Moderate (Grade III) plaque.   EKG:  Vent. rate 71 BPM PR interval * ms QRS duration 85 ms QT/QTcB 547/595 ms P-R-T axes * 108 45 Atrial fibrillation Low voltage, extremity and  precordial leads Prolonged QT interval  Assessment and Plan: Principal Problem:   Volume overload With severe hypoalbuminemia. Observation/telemetry. Supplemental oxygen as needed. Sodium and fluid restriction. Continue furosemide 60 mg IVP twice daily. Continue daily ARB. Hold beta-blocker for now. Monitor daily weights, intake and output. Monitor renal function electrolytes.  Active Problems:   Essential hypertension Continue valsartan 80 mg p.o. daily. Resume beta-blocker tomorrow Continue furosemide as above. Monitor blood pressure closely.    CAD (coronary artery disease) On apixaban, Imdur, beta-blocker and statin.    Persistent atrial fibrillation (HCC) CHA2DS2-VASc Score of at least 7. Continue apixaban 5 mg p.o. twice daily. Resume metoprolol tomorrow.    Type 2 diabetes mellitus with hyperlipidemia (HCC) Carbohydrate modified diet. CBG monitoring before meals and bedtime. Regular insulin sliding scale with meals.    Seizure (HCC) Continue Keppra 500 mg p.o. twice daily.    Hyperlipidemia Continue atorvastatin 40 mg p.o. daily    COPD (chronic obstructive pulmonary disease) (HCC) DuoNebs 4 times a day. Continue Breo elliptica twice daily. Follow-up with pulmonary as an outpatient.  CKD (chronic kidney disease) stage 4, GFR 15-29 ml/min (HCC) Around baseline. Monitor renal function electrolytes.    Prolonged QT interval Avoid QT prolonging meds as possible. Continue KCl supplementation Check magnesium level. Replace magnesium as needed. Keep electrolytes optimized. Check EKG in the morning.    Malignant neoplasm of left female breast (HCC) Continue letrozole 2.5 mg p.o. daily.    Protein-calorie malnutrition, severe (HCC) Needs protein supplementation. Consider nutritional services evaluation.     Advance Care Planning:   Code Status: DNR   Consults:   Family Communication: Her Sister [name] was at bedside.  Severity of Illness: The  appropriate patient status for this patient is OBSERVATION. Observation status is judged to be reasonable and necessary in order to provide the required intensity of service to ensure the patient's safety. The patient's presenting symptoms, physical exam findings, and initial radiographic and laboratory data in the context of their medical condition is felt to place them at decreased risk for further clinical deterioration. Furthermore, it is anticipated that the patient will be medically stable for discharge from the hospital within 2 midnights of admission.   Author: Reubin Milan, MD 01/29/2023 4:53 PM  For on call review [email or web address].   This document was prepared using Dragon voice recognition software and may contain some unintended transcription errors.

## 2023-01-27 DIAGNOSIS — I509 Heart failure, unspecified: Secondary | ICD-10-CM

## 2023-01-27 DIAGNOSIS — I429 Cardiomyopathy, unspecified: Secondary | ICD-10-CM | POA: Diagnosis present

## 2023-01-27 DIAGNOSIS — K7460 Unspecified cirrhosis of liver: Secondary | ICD-10-CM | POA: Diagnosis present

## 2023-01-27 DIAGNOSIS — R188 Other ascites: Secondary | ICD-10-CM | POA: Diagnosis present

## 2023-01-27 DIAGNOSIS — R0602 Shortness of breath: Secondary | ICD-10-CM | POA: Diagnosis present

## 2023-01-27 DIAGNOSIS — C50912 Malignant neoplasm of unspecified site of left female breast: Secondary | ICD-10-CM | POA: Diagnosis present

## 2023-01-27 DIAGNOSIS — G9341 Metabolic encephalopathy: Secondary | ICD-10-CM | POA: Diagnosis not present

## 2023-01-27 DIAGNOSIS — E1122 Type 2 diabetes mellitus with diabetic chronic kidney disease: Secondary | ICD-10-CM | POA: Diagnosis present

## 2023-01-27 DIAGNOSIS — J449 Chronic obstructive pulmonary disease, unspecified: Secondary | ICD-10-CM | POA: Diagnosis present

## 2023-01-27 DIAGNOSIS — E11649 Type 2 diabetes mellitus with hypoglycemia without coma: Secondary | ICD-10-CM | POA: Diagnosis not present

## 2023-01-27 DIAGNOSIS — D638 Anemia in other chronic diseases classified elsewhere: Secondary | ICD-10-CM | POA: Diagnosis present

## 2023-01-27 DIAGNOSIS — I2510 Atherosclerotic heart disease of native coronary artery without angina pectoris: Secondary | ICD-10-CM | POA: Diagnosis not present

## 2023-01-27 DIAGNOSIS — E669 Obesity, unspecified: Secondary | ICD-10-CM | POA: Diagnosis present

## 2023-01-27 DIAGNOSIS — D696 Thrombocytopenia, unspecified: Secondary | ICD-10-CM | POA: Diagnosis present

## 2023-01-27 DIAGNOSIS — I4819 Other persistent atrial fibrillation: Secondary | ICD-10-CM | POA: Diagnosis present

## 2023-01-27 DIAGNOSIS — Z515 Encounter for palliative care: Secondary | ICD-10-CM | POA: Diagnosis not present

## 2023-01-27 DIAGNOSIS — E8770 Fluid overload, unspecified: Secondary | ICD-10-CM | POA: Diagnosis not present

## 2023-01-27 DIAGNOSIS — I5033 Acute on chronic diastolic (congestive) heart failure: Secondary | ICD-10-CM | POA: Diagnosis present

## 2023-01-27 DIAGNOSIS — Z66 Do not resuscitate: Secondary | ICD-10-CM | POA: Diagnosis present

## 2023-01-27 DIAGNOSIS — C9201 Acute myeloblastic leukemia, in remission: Secondary | ICD-10-CM | POA: Diagnosis present

## 2023-01-27 DIAGNOSIS — I130 Hypertensive heart and chronic kidney disease with heart failure and stage 1 through stage 4 chronic kidney disease, or unspecified chronic kidney disease: Secondary | ICD-10-CM | POA: Diagnosis present

## 2023-01-27 DIAGNOSIS — J9621 Acute and chronic respiratory failure with hypoxia: Secondary | ICD-10-CM | POA: Diagnosis present

## 2023-01-27 DIAGNOSIS — I4892 Unspecified atrial flutter: Secondary | ICD-10-CM | POA: Diagnosis present

## 2023-01-27 DIAGNOSIS — N184 Chronic kidney disease, stage 4 (severe): Secondary | ICD-10-CM | POA: Diagnosis present

## 2023-01-27 DIAGNOSIS — I083 Combined rheumatic disorders of mitral, aortic and tricuspid valves: Secondary | ICD-10-CM | POA: Diagnosis present

## 2023-01-27 DIAGNOSIS — G40909 Epilepsy, unspecified, not intractable, without status epilepticus: Secondary | ICD-10-CM | POA: Diagnosis present

## 2023-01-27 DIAGNOSIS — I10 Essential (primary) hypertension: Secondary | ICD-10-CM | POA: Diagnosis not present

## 2023-01-27 DIAGNOSIS — E1165 Type 2 diabetes mellitus with hyperglycemia: Secondary | ICD-10-CM | POA: Diagnosis present

## 2023-01-27 DIAGNOSIS — Z1152 Encounter for screening for COVID-19: Secondary | ICD-10-CM | POA: Diagnosis not present

## 2023-01-27 LAB — GLUCOSE, CAPILLARY
Glucose-Capillary: 128 mg/dL — ABNORMAL HIGH (ref 70–99)
Glucose-Capillary: 118 mg/dL — ABNORMAL HIGH (ref 70–99)
Glucose-Capillary: 271 mg/dL — ABNORMAL HIGH (ref 70–99)
Glucose-Capillary: 209 mg/dL — ABNORMAL HIGH (ref 70–99)
Glucose-Capillary: 161 mg/dL — ABNORMAL HIGH (ref 70–99)

## 2023-01-27 LAB — RENAL FUNCTION PANEL
Albumin: 2.1 g/dL — ABNORMAL LOW (ref 3.5–5.0)
Anion gap: 8 (ref 5–15)
BUN: 49 mg/dL — ABNORMAL HIGH (ref 8–23)
CO2: 24 mmol/L (ref 22–32)
Calcium: 7 mg/dL — ABNORMAL LOW (ref 8.9–10.3)
Chloride: 110 mmol/L (ref 98–111)
Creatinine, Ser: 2.23 mg/dL — ABNORMAL HIGH (ref 0.44–1.00)
GFR, Estimated: 22 mL/min — ABNORMAL LOW (ref >60)
Glucose, Bld: 159 mg/dL — ABNORMAL HIGH (ref 70–99)
Phosphorus: 4.5 mg/dL (ref 2.5–4.6)
Potassium: 4.7 mmol/L (ref 3.5–5.1)
Sodium: 142 mmol/L (ref 135–145)

## 2023-01-27 LAB — FOLATE: Folate: >40 ng/mL (ref >5.9)

## 2023-01-27 LAB — FERRITIN: Ferritin: 307 ng/mL (ref 11–307)

## 2023-01-27 LAB — IRON AND TIBC
Iron: 124 ug/dL (ref 28–170)
Saturation Ratios: 91 % — ABNORMAL HIGH (ref 10.4–31.8)
TIBC: 136 ug/dL — ABNORMAL LOW (ref 250–450)
UIBC: 12 ug/dL

## 2023-01-27 LAB — CBC
HCT: 22.9 % — ABNORMAL LOW (ref 36.0–46.0)
Hemoglobin: 6.9 g/dL — CL (ref 12.0–15.0)
MCH: 31.4 pg (ref 26.0–34.0)
MCHC: 30.1 g/dL (ref 30.0–36.0)
MCV: 104.1 fL — ABNORMAL HIGH (ref 80.0–100.0)
Platelets: 173 10*3/uL (ref 150–400)
RBC: 2.2 MIL/uL — ABNORMAL LOW (ref 3.87–5.11)
RDW: 17.4 % — ABNORMAL HIGH (ref 11.5–15.5)
WBC: 4.7 10*3/uL (ref 4.0–10.5)
nRBC: 0 % (ref 0.0–0.2)

## 2023-01-27 LAB — RETICULOCYTES
Immature Retic Fract: 15.4 % (ref 2.3–15.9)
RBC.: 2.53 MIL/uL — ABNORMAL LOW (ref 3.87–5.11)
Retic Count, Absolute: 42 10*3/uL (ref 19.0–186.0)
Retic Ct Pct: 1.7 % (ref 0.4–3.1)

## 2023-01-27 LAB — VITAMIN B12: Vitamin B-12: 664 pg/mL (ref 180–914)

## 2023-01-27 LAB — PREPARE RBC (CROSSMATCH)

## 2023-01-27 MED ORDER — VITAMIN D 25 MCG (1000 UNIT) PO TABS
1000.0000 [IU] | ORAL_TABLET | Freq: Every day | ORAL | Status: DC
  Administered 2023-01-27 – 2023-01-30 (×4): 1000 [IU] via ORAL
  Filled 2023-01-27 (×4): qty 1

## 2023-01-27 MED ORDER — SODIUM CHLORIDE 0.9% IV SOLUTION: Freq: Once | INTRAVENOUS | Status: AC

## 2023-01-27 MED ORDER — ALBUMIN HUMAN 25 % IV SOLN
25.0000 g | Freq: Four times a day (QID) | INTRAVENOUS | Status: AC
  Administered 2023-01-27 (×2): 25 g via INTRAVENOUS
  Filled 2023-01-27 (×2): qty 100

## 2023-01-27 MED ORDER — FERROUS SULFATE 325 (65 FE) MG PO TABS
325.0000 mg | ORAL_TABLET | Freq: Every day | ORAL | Status: DC
  Administered 2023-01-28 – 2023-01-30 (×3): 325 mg via ORAL
  Filled 2023-01-27 (×3): qty 1

## 2023-01-27 MED ORDER — LORAZEPAM 0.5 MG PO TABS
0.5000 mg | ORAL_TABLET | Freq: Two times a day (BID) | ORAL | Status: DC | PRN
  Administered 2023-01-27 – 2023-01-28 (×3): 0.5 mg via ORAL
  Filled 2023-01-27 (×3): qty 1

## 2023-01-27 MED ORDER — FUROSEMIDE 10 MG/ML IJ SOLN
80.0000 mg | Freq: Two times a day (BID) | INTRAMUSCULAR | Status: DC
  Administered 2023-01-27 – 2023-01-29 (×4): 80 mg via INTRAVENOUS
  Filled 2023-01-27 (×4): qty 8

## 2023-01-27 MED ORDER — IPRATROPIUM-ALBUTEROL 0.5-2.5 (3) MG/3ML IN SOLN
3.0000 mL | Freq: Three times a day (TID) | RESPIRATORY_TRACT | Status: DC
  Administered 2023-01-27 – 2023-01-28 (×5): 3 mL via RESPIRATORY_TRACT
  Filled 2023-01-27 (×5): qty 3

## 2023-01-27 NOTE — Consult Note (Addendum)
Cardiology Consultation   Patient ID: [name] MRN: [id number]; DOB: [birth year]  Admit date: 01/19/2023 Date of Consult: 01/27/2023  PCP:  [name], Catherine Halsted, MD   [facility] Prov[facility] Cardiologist:  Catherine Moores, MD       Patient Profile:   [name] is a 82 y.o. female with a hx of CAD, PAD, chronic diastolic heart failure, hypertension, hyperlipidemia, A-fib/atrial flutter, COPD, AML in remission, breast cancer, uterine cancer, CKD stage IIIb, cirrhosis and anemia who is being seen 01/27/2023 for the evaluation of acute on chronic diastolic CHF at the request of [name].  History of Present Illness:   [name] is a 82 year old female with past medical history of CAD, PAD, chronic diastolic heart failure, hypertension, hyperlipidemia, A-fib/atrial flutter, COPD, AML in remission, breast cancer, uterine cancer, CKD stage IIIb, cirrhosis and anemia.  Previous cardiac catheterization in 2018 demonstrated multivessel CAD with sequential 40% ostial and 30% distal left main lesion, 50 to 60% proximal to mid LAD lesion, 40% distal left circumflex, 80% proximal to mid RCA lesion.  She was turned down for CABG by [facility] and subsequently underwent DES to mid RCA stenting.  She has been maintained on Plavix monotherapy.  EF at that time was 40 to 45%, however improved to normal range by follow-up echo in June 2021.  Patient was admitted in May 2023 with acute on chronic diastolic heart failure and new PAF.  Patient was started on Eliquis 5 mg twice a day and metoprolol succinate.  She underwent IV diuresis and was discharged on 40 mg daily of torsemide, 10 mg Jardiance and Entresto.  She had itching associated with Delene Loll and this was later switched to valsartan.  She was admitted in November 2023 with worsening edema despite doubling up torsemide dosage.  She underwent IV diuresis.  Serum creatinine increased to greater than 3 from the previous baseline of 1.7-1.8.   Echocardiogram obtained on 10/27/2022 showed EF of 60 to 65%, mild LVH, mild MR, mild to moderate aortic stenosis, moderate to severe TR.  Some of her edema was contributed by hypoalbuminemia.  She did undergo right thoracentesis with removal 1 L of fluid.  Ultimately, she was discharged on home dose of 40 mg daily of torsemide.  Her dry weight was felt to be around 145 pounds.  She underwent TEE on 11/19/2022 which showed EF 60 to 65%, normal RV, trivial MR, moderate TR, no signs of significant aortic valve disease.   Patient has been admitted from 12/31 - 1/18 due to generalized weakness, urinary tract infection and acute heart failure symptoms.  Patient was also found to have a wound on the left great toe and underwent balloon angioplasty of left SFA and superficial femoral artery by [name].  Postprocedure, patient was resumed on combination of Plavix and Eliquis.  Hospital course complicated by AKI with creatinine up to 4.3 and anemia with hemoglobin of 7.2 require 1 unit of blood transfusion prior to discharge.  She was readmitted from 1/25 - 2/2 for lower extremity edema, acute on chronic diastolic heart failure and critical leg ischemia.  She had dry gangrene of the left great toe.  Lower extremity duplex revealed restenosis of left SFA.  She was seen both by vascular surgery and [name] service.  Blood culture was negative.  She underwent partial first ray amputation by [name] on 1/27.  She underwent stenting and angioplasty of left femoral-popliteal artery.  Patient was discharged to  [facility] on Lasix 80 mg twice a day.   Patient returned [facility] to [facility] on 01/[facility]/2023 with dyspnea and ascites.  Chest x-ray showed worsening inflammation with increasing small effusions and opacities.  Serial troponin was negative.  Initial blood work was significant for creatinine of 2.13, normal electrolyte, albumin was low at 2.0, hemoglobin 7.0.  Viral panel negative.   Patient arrived in atrial fibrillation with controlled ventricular heart rate.  Although she self converted to sinus rhythm for about an hour while in the emergency room, she quickly converted back to atrial fibrillation with controlled ventricular rate.  Looking back, she has been in persistent A-fib since at least September 2023.  BNP was 677.  She did not put out much was 60 mg twice a day of Lasix, IV Lasix has been transitioned to 80 mg twice a day.  On physical exam, she has anasarca with swelling in the lower back, upper extremities and lower extremities.   Past Medical History:  Diagnosis Date   Acute myelogenous leukemia ([place]) 02/2014   Acute pancreatitis    Arthritis    "back" (09/17/2017)   Atrial flutter (HCC)    Cardiomyopathy (HCC)    CHF (congestive heart failure) (HCC)    Chronic kidney disease, stage II (mild)    Chronic lower back pain    Colon polyps 04/29/2010   TUBULAR ADENOMA AND A SERRATED ADENOMA   COPD (chronic obstructive pulmonary disease) ([place])    CORONARY ARTERY DISEASE 12/24/2007   DIABETES MELLITUS, TYPE II 07/13/2007   History of blood transfusion 2015   "related to leukemia"   History of kidney stones 12/24/2007   History of uterine cancer    HYPERLIPIDEMIA 12/24/2007   HYPERTENSION 07/13/2007   NSTEMI (non-ST elevated myocardial infarction) ([place]) 09/17/2017   Uterine cancer ([place])     Past Surgical History:  Procedure Laterality Date   ABDOMINAL AORTOGRAM W/LOWER EXTREMITY Bilateral [name]   Procedure: ABDOMINAL AORTOGRAM W/LOWER EXTREMITY;  Surgeon: Broadus John, MD;  Location: [facility];  Service: Cardiovascular;  Laterality: Bilateral;   ABDOMINAL AORTOGRAM W/LOWER EXTREMITY N/A 01/05/2023   Procedure: ABDOMINAL AORTOGRAM W/LOWER EXTREMITY;  Surgeon: Cherre Robins, MD;  Location: [facility];  Service: Cardiovascular;  Laterality: N/A;   ABDOMINAL HYSTERECTOMY     "still have my ovaries"   AMPUTATION TOE Left 01/03/2023    Procedure: AMPUTATION LEFT GREAT TOE with Partial First Ray Amputation;  Surgeon: Edrick Kins, DPM;  Location: [facility];  Service: [name];  Laterality: Left;   BREAST BIOPSY Left 09/22/2019   BREAST LUMPECTOMY Left 10/24/2019   BREAST LUMPECTOMY WITH RADIOACTIVE SEED LOCALIZATION Left 10/24/2019   Procedure: LEFT BREAST LUMPECTOMY WITH RADIOACTIVE SEED LOCALIZATION;  Surgeon: Jovita Kussmaul, MD;  Location: [facility];  Service: General;  Laterality: Left;   CARDIAC CATHETERIZATION  2002   CARDIAC CATHETERIZATION  09/17/2017   CATARACT EXTRACTION W/ INTRAOCULAR LENS  IMPLANT, BILATERAL Bilateral    COLONOSCOPY W/ BIOPSIES AND POLYPECTOMY  2011   CORONARY STENT INTERVENTION N/A 09/21/2017   Procedure: CORONARY STENT INTERVENTION;  Surgeon: Troy Sine, MD;  Location: [facility];  Service: Cardiovascular;  Laterality: N/A;   DILATION AND CURETTAGE OF UTERUS     ESOPHAGOGASTRODUODENOSCOPY N/A 01/30/2022   Procedure: ESOPHAGOGASTRODUODENOSCOPY (EGD);  Surgeon: Milus Banister, MD;  Location: [facility];  Service: Endoscopy;  Laterality: N/A;   EUS N/A 01/30/2022   Procedure: UPPER ENDOSCOPIC ULTRASOUND (EUS) RADIAL;  Surgeon:  Milus Banister, MD;  Location: [facility];  Service: Endoscopy;  Laterality: N/A;   EXCISIONAL HEMORRHOIDECTOMY  X 2   FINE NEEDLE ASPIRATION N/A 01/30/2022   Procedure: FINE NEEDLE ASPIRATION (FNA) LINEAR;  Surgeon: Milus Banister, MD;  Location: [facility];  Service: Endoscopy;  Laterality: N/A;   GANGLION CYST EXCISION Left X 3   INTRAMEDULLARY (IM) NAIL INTERTROCHANTERIC Right 08/30/2022   Procedure: INTRAMEDULLARY (IM) NAIL INTERTROCHANTERIC;  Surgeon: Altamese Rockholds, MD;  Location: [facility];  Service: Orthopedics;  Laterality: Right;   INTRAVASCULAR PRESSURE WIRE/FFR STUDY N/A 09/17/2017   Procedure: INTRAVASCULAR PRESSURE WIRE/FFR STUDY;  Surgeon: Nelva Bush, MD;  Location: [facility];  Service: Cardiovascular;   Laterality: N/A;   IR THORACENTESIS ASP PLEURAL SPACE W/IMG GUIDE  10/27/2022   NASAL SINUS SURGERY     PERIPHERAL VASCULAR BALLOON ANGIOPLASTY  [name]   Procedure: PERIPHERAL VASCULAR BALLOON ANGIOPLASTY;  Surgeon: Broadus John, MD;  Location: [facility];  Service: Cardiovascular;;  Lt. SFA   PERIPHERAL VASCULAR INTERVENTION  01/05/2023   Procedure: PERIPHERAL VASCULAR INTERVENTION;  Surgeon: Cherre Robins, MD;  Location: [facility];  Service: Cardiovascular;;   PORTA CATH INSERTION Right 2013   RIGHT/LEFT HEART CATH AND CORONARY ANGIOGRAPHY N/A 09/17/2017   Procedure: RIGHT/LEFT HEART CATH AND CORONARY ANGIOGRAPHY;  Surgeon: Nelva Bush, MD;  Location: [facility];  Service: Cardiovascular;  Laterality: N/A;   TEE WITHOUT CARDIOVERSION N/A 11/19/2022   Procedure: TRANSESOPHAGEAL ECHOCARDIOGRAM (TEE);  Surgeon: Hebert Soho, DO;  Location: [facility];  Service: Cardiovascular;  Laterality: N/A;   TUBAL LIGATION       Home Medications:  Prior to Admission medications   Medication Sig Start Date End Date Taking? Authorizing Provider  acetaminophen (TYLENOL) 325 MG tablet Take 650 mg by mouth every 6 (six) hours as needed (pre-pain).   Yes [provider]  apixaban (ELIQUIS) 2.5 MG TABS tablet Take 1 tablet (2.5 mg total) by mouth 2 (two) times daily. 09/30/22  Yes Angiulli, Lavon Paganini, PA-C  ascorbic acid (VITAMIN C) 500 MG tablet Take 1,000 mg by mouth daily.   Yes [provider]  atorvastatin (LIPITOR) 40 MG tablet Take 1 tablet (40 mg total) by mouth daily. Patient taking differently: Take 40 mg by mouth at bedtime. 09/30/22  Yes Angiulli, Lavon Paganini, PA-C  calcium acetate (PHOSLO) 667 MG capsule Take 2 capsules (1,334 mg total) by mouth 3 (three) times daily with meals. 12/25/22  Yes Bonnielee Haff, MD  cephALEXin (KEFLEX) 500 MG capsule Take 1 capsule (500 mg total) by mouth 3 (three) times daily. Patient taking differently: Take  500 mg by mouth 3 (three) times daily. (01/19/23-01/27/23) 01/14/23  Yes Edrick Kins, DPM  Cholecalciferol (VITAMIN D3) 1.25 MG (50000 UT) capsule Take 50,000 Units by mouth every Monday.   Yes [provider]  clopidogrel (PLAVIX) 75 MG tablet Take 1 tablet (75 mg total) by mouth every evening. 09/30/22  Yes Angiulli, Lavon Paganini, PA-C  cyanocobalamin (VITAMIN B12) 1000 MCG/ML injection INJECT 1ML IN THE MUSCLE EVERY 30 DAYS AS DIRECTED Patient taking differently: Inject 1,000 mcg into the muscle every 30 (thirty) days. 08/28/22  Yes [name], Catherine Halsted, MD  docusate sodium (COLACE) 100 MG capsule Take 1 capsule (100 mg total) by mouth 2 (two) times daily. 09/30/22  Yes Angiulli, Lavon Paganini, PA-C  ferrous sulfate 325 (65 FE) MG tablet Take 1 tablet (325 mg total) by mouth 2 (two) times daily with a  meal. Patient taking differently: Take 325 mg by mouth 2 (two) times daily. 01/09/23  Yes Thurnell Lose, MD  fluticasone furoate-vilanterol (BREO ELLIPTA) 200-25 MCG/ACT AEPB Inhale 1 puff into the lungs as needed (wheezing SOB). Patient taking differently: Inhale 1 puff into the lungs every 4 (four) hours as needed (wheezing, shortness of breath). 09/30/22  Yes Angiulli, Lavon Paganini, PA-C  folic acid (FOLVITE) 1 MG tablet Take 1 tablet (1 mg total) by mouth daily. 01/10/23  Yes Thurnell Lose, MD  gabapentin (NEURONTIN) 100 MG capsule Take 100 mg by mouth 3 (three) times daily as needed (breakthrough pain).   Yes [provider]  insulin aspart (NOVOLOG) 100 UNIT/ML FlexPen Before each meal 3 times a day, 140-199 - 2 units, 200-250 - 4 units, 251-299 - 6 units,  300-349 - 8 units,  350 or above 10 units. Insulin PEN if approved, provide syringes and needles if needed.Please switch to any approved short acting Insulin if needed. Patient taking differently: Inject 2-10 Units into the skin See admin instructions. Check fasting blood sugar before each meal and inject 2-10 units per sliding  scale: 140-199 : 2 units 200-250 : 4 units 251-299 : 6 units 300-349 : 8 units 350 or above : 10 units and call MD 01/09/23  Yes Thurnell Lose, MD  insulin glargine (LANTUS) 100 UNIT/ML injection Inject 10 Units into the skin 2 (two) times daily.   Yes [provider]  isosorbide mononitrate (IMDUR) 30 MG 24 hr tablet Take 1 tablet (30 mg total) by mouth daily. 09/30/22  Yes Angiulli, Lavon Paganini, PA-C  letrozole [facility]) 2.5 MG tablet Take 1 tablet (2.5 mg total) by mouth daily. 02/14/22  Yes Owens Shark, NP  levETIRAcetam (KEPPRA) 500 MG tablet Take 1 tablet (500 mg total) by mouth at bedtime. Patient taking differently: Take 500 mg by mouth at bedtime. 01/22/23 01/17/24 Yes Camara, Maryan Puls, MD  LORazepam (ATIVAN) 0.5 MG tablet Take 1 tablet (0.5 mg total) by mouth every 6 (six) hours as needed for anxiety. 01/09/23  Yes Thurnell Lose, MD  melatonin 3 MG TABS tablet Take 1 tablet (3 mg total) by mouth at bedtime. 09/30/22  Yes Angiulli, Lavon Paganini, PA-C  Menthol-Zinc Oxide (MOISTURE BARRIER EX) Apply 1 application  topically See admin instructions. Apply barrier cream to sacrum twice daily and with each incontinent care.   Yes [provider]  methocarbamol (ROBAXIN) 500 MG tablet Take 1 tablet (500 mg total) by mouth every 4 (four) hours as needed for muscle spasms. 09/30/22  Yes Angiulli, Lavon Paganini, PA-C  metoprolol succinate (TOPROL-XL) 25 MG 24 hr tablet Take 0.5 tablets (12.5 mg total) by mouth daily. Take with or immediately following a meal. 09/30/22  Yes Angiulli, Lavon Paganini, PA-C  Multiple Vitamin (MULTIVITAMIN WITH MINERALS) TABS tablet Take 1 tablet by mouth daily. 09/30/22  Yes Angiulli, Lavon Paganini, PA-C  nitroGLYCERIN (NITROSTAT) 0.4 MG SL tablet Place 0.4 mg under the tongue every 5 (five) minutes x 3 doses as needed for chest pain. 11/08/22  Yes [provider]  oxyCODONE-acetaminophen (PERCOCET/ROXICET) 5-325 MG tablet Take 1-2 tablets by mouth every 6 (six)  hours as needed for severe pain. Patient taking differently: Take 2 tablets by mouth every 6 (six) hours as needed for severe pain. 01/08/23  Yes Thurnell Lose, MD  OXYGEN Inhale 2 L/min into the lungs continuous.   Yes [provider]  Oyster Shell 500 MG TABS Take 500 mg by mouth 2 (two)  times daily.   Yes [provider]  polyethylene glycol (MIRALAX / GLYCOLAX) 17 g packet Take 17 g by mouth daily. 09/30/22  Yes Angiulli, Lavon Paganini, PA-C  potassium chloride (KLOR-CON) 10 MEQ tablet Take 10 mEq by mouth 2 (two) times daily.   Yes [provider]  Psyllium (METAMUCIL FIBER SINGLES PO) Take 1 packet by mouth daily.   Yes [provider]  torsemide (DEMADEX) 20 MG tablet Take 20-40 mg by mouth See admin instructions. 9m twice a day for 3 days (01/25/23-01/28/23) then take 20 mg twice daily starting on 2/21.   Yes [provider]    Inpatient Medications: Scheduled Meds:  apixaban  2.5 mg Oral BID   atorvastatin  40 mg Oral Daily   calcium acetate  1,334 mg Oral TID WC   cholecalciferol  1,000 Units Oral Daily   docusate sodium  100 mg Oral BID   [START ON 01/28/2023] ferrous sulfate  325 mg Oral Q breakfast   folic acid  1 mg Oral Daily   furosemide  80 mg Intravenous BID   gabapentin  100 mg Oral TID   insulin aspart  0-9 Units Subcutaneous TID WC   ipratropium-albuterol  3 mL Nebulization TID   letrozole  2.5 mg Oral Daily   levETIRAcetam  500 mg Oral BID   potassium chloride  10 mEq Oral BID   Continuous Infusions:  albumin human 25 g (01/27/23 1123)   PRN Meds: acetaminophen **OR** acetaminophen, fluticasone furoate-vilanterol, LORazepam, melatonin, nitroGLYCERIN, mouth rinse, oxyCODONE, prochlorperazine  Allergies:    Allergies  Allergen Reactions   Hibiclens [Chlorhexidine Gluconate] Itching and Rash    Social History:   Social History   Socioeconomic History   Marital status: Married    Spouse name: WCassity Depolo   Number of children: 1   Years of education: Not on file   Highest education level: 9th grade  Occupational History   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  Tobacco Use   Smoking status: Former    Packs/day: 1.00    Years: 20.00    Total pack years: 20.00    Types: Cigarettes    Quit date: 12/08/2000    Years since quitting: 22.1   Smokeless tobacco: Never  Vaping Use   Vaping Use: Never used  Substance and Sexual Activity   Alcohol use: No    Alcohol/week: 0.0 standard drinks of alcohol   Drug use: No   Sexual activity: Never  Other Topics Concern   Not on file  Social History Narrative   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$    Social Determinants of Health   Financial Resource Strain: Low Risk  (10/22/2022)   Overall Financial Resource Strain (CARDIA)    Difficulty of Paying Living Expenses: Not very hard  Food Insecurity: No Food Insecurity (02/03/2023)   Hunger Vital Sign    Worried About Running Out of Food in the Last Year: Never true    R[place] the Last Year: Never true  Transportation Needs: No Transportation Needs (01/30/2023)   PRAPARE - THydrologist(Medical): No    Lack of Transportation (Non-Medical): No  Physical Activity: Not on file  Stress: No Stress Concern Present (05/12/2022)   [place]   Feeling of Stress : Not at all  Social Connections: Not on file  Intimate Partner Violence:  Not At Risk (01/22/2023)   Humiliation, Afraid, Rape, and Kick questionnaire    Fear of Current or Ex-Partner: No    Emotionally Abused: No    Physically Abused: No    Sexually Abused: No    Family History:    Family History  Problem Relation Age of Onset   COPD Father    Esophageal cancer Father    Breast cancer Sister 24   Lung cancer Sister    Esophageal cancer Brother    Suicidality Brother    Lung cancer Sister    Lung  cancer Sister    Cervical cancer Sister    Congestive Heart Failure Mother    Heart attack Maternal Grandmother    Skin cancer Maternal Grandfather    Heart attack Maternal Grandfather    Stroke Paternal Grandmother 30   Heart attack Paternal Grandfather    Cancer Maternal Uncle        unknown type, dx. >65   Cancer Paternal Aunt        unknown type, dx. >50   Cancer Paternal Uncle        unknown type, dx. >50   Cirrhosis Maternal Uncle    Skin cancer Daughter    Leukemia Cousin        maternal first cousin; dx. >50, in remission   Cancer Niece        unknown type behind her ear, dx. 30s/40s   Colon cancer Neg Hx      ROS:  Please see the history of present illness.   All other ROS reviewed and negative.     Physical Exam/Data:   Vitals:   01/27/23 0815 01/27/23 0818 01/27/23 0833 01/27/23 1106  BP:  139/81 (!) 136/55 (!) 139/58  Pulse:  89 83 82  Resp:  18 18 18  $ Temp:  97.8 F (36.6 C) 97.9 F (36.6 C) 97.6 F (36.4 C)  TempSrc:  Oral Oral Oral  SpO2: 98% 100% 96%   Weight:      Height:        Intake/Output Summary (Last 24 hours) at 01/27/2023 1326 Last data filed at 01/27/2023 1106 Gross per 24 hour  Intake 557 ml  Output 700 ml  Net -143 ml      01/27/2023    3:36 AM 01/22/2023    9:26 PM 01/16/2023   10:51 AM  Last 3 Weights  Weight (lbs) 166 lb 7.2 oz 166 lb 7.2 oz 157 lb  Weight (kg) 75.5 kg 75.5 kg 71.215 kg     Body mass index is 30.44 kg/m.  General:  Well nourished, well developed, in no acute distress HEENT: normal Neck: no JVD Vascular: No carotid bruits; Distal pulses 2+ bilaterally Cardiac:  normal S1, S2; RRR; no murmur  Lungs:  clear to auscultation bilaterally, no wheezing, rhonchi or rales  Abd: soft, nontender, no hepatomegaly  Ext: 2-3+ pitting edema in bilateral lower lower extremity, also has pitting edema in the upper extremity and lower back. Musculoskeletal:  No deformities, BUE and BLE strength normal and equal Skin: warm  and dry  Neuro:  CNs 2-12 intact, no focal abnormalities noted Psych:  Normal affect   EKG:  The EKG was personally reviewed and demonstrates: Atrial fibrillation, low voltage Telemetry:  Telemetry was personally reviewed and demonstrates: Arrived in atrial fibrillation, briefly converted to sinus rhythm yesterday for about an hour before going back into rate controlled A-fib.  Relevant CV Studies:  Echo 10/16/2022  1. Left ventricular ejection fraction,  by estimation, is 60 to 65%. The  left ventricle has normal function. The left ventricle has no regional  wall motion abnormalities. There is mild left ventricular hypertrophy.  Left ventricular diastolic parameters  are indeterminate.   2. Right ventricular systolic function is normal. The right ventricular  size is normal. There is mildly elevated pulmonary artery systolic  pressure.   3. Left atrial size was moderately dilated.   4. The mitral valve is degenerative. Mild mitral valve regurgitation. No  evidence of mitral stenosis. Severe mitral annular calcification.   5. Tricuspid valve regurgitation is moderate to severe.   6. The aortic valve is normal in structure. There is severe calcifcation  of the aortic valve. There is severe thickening of the aortic valve.  Aortic valve regurgitation is not visualized. Mild to moderate aortic  valve stenosis.   7. The inferior vena cava is normal in size with greater than 50%  respiratory variability, suggesting right atrial pressure of 3 mmHg.   Laboratory Data:  High Sensitivity Troponin:   Recent Labs  Lab 01/13/2023 1935 01/10/2023 2205  TROPONINIHS 12 12     Chemistry Recent Labs  Lab 02/04/2023 1145 01/11/2023 1935 01/27/23 0330  NA 141  --  142  K 4.8  --  4.7  CL 109  --  110  CO2 22  --  24  GLUCOSE 103*  --  159*  BUN 49*  --  49*  CREATININE 2.13*  --  2.23*  CALCIUM 7.1*  --  7.0*  MG  --  1.5*  --   GFRNONAA 23*  --  22*  ANIONGAP 10  --  8    Recent Labs  Lab  01/27/2023 1145 01/27/23 0330  PROT 5.0*  --   ALBUMIN 2.0* 2.1*  AST 16  --   ALT 11  --   ALKPHOS 106  --   BILITOT 0.5  --    Lipids No results for input(s): "CHOL", "TRIG", "HDL", "LABVLDL", "LDLCALC", "CHOLHDL" in the last 168 hours.  Hematology Recent Labs  Lab 01/16/2023 1145 01/27/23 0330  WBC 4.0 4.7  RBC 2.17* 2.20*  HGB 7.0* 6.9*  HCT 22.2* 22.9*  MCV 102.3* 104.1*  MCH 32.3 31.4  MCHC 31.5 30.1  RDW 17.3* 17.4*  PLT 141* 173   Thyroid No results for input(s): "TSH", "FREET4" in the last 168 hours.  BNP Recent Labs  Lab 01/30/2023 2205  BNP 677.3*    DDimer No results for input(s): "DDIMER" in the last 168 hours.   Radiology/Studies:  DG Chest 2 View  Result Date: 02/02/2023 CLINICAL DATA:  Shortness of breath EXAM: CHEST - 2 VIEW COMPARISON:  01/01/2023 x-ray FINDINGS: Underinflation. Once again there is an enlarged cardiopericardial silhouette with a calcified aorta and right upper chest port. Increasing small right effusion right-greater-than-left and vascular congestion. No pneumothorax. Overlapping cardiac leads. IMPRESSION: Worsening inflation with increasing small effusions and opacities. Recommend follow-up Electronically Signed   By: Jill Side M.D.   On: 01/25/2023 13:23     Assessment and Plan:   Anasarca/acute on chronic diastolic heart failure  -Worsened by hypoalbuminemia, albumin 2.0.  Receiving IV albumin.  Patient has anasarca with swelling in the arms, legs and lower back, essentially all dependent area.  -Continue with 80 mg twice a day of Lasix.  She did not put out much was 60 mg twice daily of Lasix from yesterday.  She will need high-protein diet.  This is a recurrent issue during multiple  recent hospitalizations.  Persistent atrial fibrillation: Initially diagnosed with new A-fib in May 2023, started on low-dose 2.5 mg twice a day of Eliquis, also on low-dose beta-blocker which is currently held due to acute heart failure. She was in  sinus rhythm until June 2023.  She has since in persistent atrial fibrillation on every single EKG.  She has no cardiac awareness.  While in the emergency room yesterday, she self converted back to sinus rhythm briefly for about an hour before going back into A-fib.  Currently in rate controlled A-fib.  CAD: PCI DES of mid RCA in 2018.  She was turned down for CABG at the time.  Mild chest discomfort yesterday when she first came in with shortness of breath, has since resolved.  Serial troponin negative.  PAD: Underwent left SFA balloon angioplasty on [name], however had restenosis and dry gangrene of the left toe.  She underwent left toe amputation by [name].  She was taken back to the Cath Lab on 01/05/2023 and underwent stenting of left SFA.  No significant leg pain  Hypertension: Home beta-blocker currently on hold.  Hyperlipidemia: On Lipitor  COPD: No signs of acute exacerbation  Severe anemia: Hemoglobin 7 on arrival, 6.9 this morning.  Will defer management to primary team.   Risk Assessment/Risk Scores:        New York Heart Association (NYHA) Functional Class NYHA Class III  CHA2DS2-VASc Score = 7   This indicates a 11.2% annual risk of stroke. The patient's score is based upon: CHF History: 1 HTN History: 1 Diabetes History: 1 Stroke History: 0 Vascular Disease History: 1 Age Score: 2 Gender Score: 1         For questions or updates, please contact [place] Please consult [email or web address] for contact info under    Signed, Almyra Deforest, [place]  01/27/2023 1:26 PM  Personally seen and examined. Agree with above.  82 year old patient with multiple readmissions for fluid overload/anasarca/chronic diastolic heart failure with albumin of 2.1, recent toe amputation secondary to dry gangrene.  Creatinine currently 2.23.  It was as high as 4 late last year during aggressive diuresis.  Family member at bedside.  Diffuse pitting edema noted Appears fairly  comfortable laying in bed.  Acute on chronic diastolic heart failure - Continue with increased IV Lasix dose.  Monitoring creatinine closely.  Unfortunately this seems to be a cycle that is hard to break.  She does not have a large amount of urine protein.  Her albumin of 2.1 is likely nutritional.  Encourage protein supplement, Ensure for instance.  She enjoys strawberry Ensure.  She would also like to have chocolate milk.  She states that this is not allowed on her menu.  I think it makes sense for [place] to give her chocolate milk. -Continue aggressive diuresis. -Previously on SGLT2 inhibitor however I do not think she is a great candidate for this at this point. -Promote activity.  Candee Furbish, MD

## 2023-01-27 NOTE — TOC Progression Note (Signed)
Transition of Care [facility]) - Progression Note    Patient Details  Name: AILANIE HOWMAN MRN: [id number] Date of Birth: August 14, 1941  Transition of Care [facility] CM/SW Contact  Zenon Mayo, RN Phone Number: 01/27/2023, 10:39 AM  Clinical Narrative:    From [facility], had left great toe amputation, she is originally from home with spouse, conts on iv lasix , hgb 6.9, transfusing.  PT to eval.         Expected Discharge Plan and Services                                               Social Determinants of Health (SDOH) Interventions SDOH Screenings   Food Insecurity: No Food Insecurity (01/30/2023)  Housing: Low Risk  (02/02/2023)  Transportation Needs: No Transportation Needs (01/17/2023)  Utilities: Not At Risk (01/16/2023)  Alcohol Screen: Low Risk  (10/22/2022)  Depression (PHQ2-9): Low Risk  (10/03/2022)  Financial Resource Strain: Low Risk  (10/22/2022)  Stress: No Stress Concern Present (05/12/2022)  Tobacco Use: Medium Risk (01/19/2023)    Readmission Risk Interventions    12/09/2022    4:35 PM  Readmission Risk Prevention Plan  Transportation Screening Complete  Medication Review (RN Care Manager) Complete  HRI or [place] Complete  SW Recovery Care/Counseling Consult Complete  Palliative Care Screening Not [place] Not Applicable

## 2023-01-27 NOTE — Progress Notes (Signed)
Hemoglobin 6.9 on morning labs, baseline 7-8 in the setting of CKD stage IV.  No bloody stools or any other obvious bleeding reported by nursing staff.  Type and screen, 1 unit PRBCs ordered after obtaining consent from the patient.  Follow-up posttransfusion H&H.

## 2023-01-27 NOTE — Care Management Obs Status (Signed)
[place] NOTIFICATION   Patient Details  Name: [name] MRN: [id number] Date of Birth: October 26, 1941   Medicare Observation Status Notification Given:  Yes    Zenon Mayo, RN 01/27/2023, 11:26 AM

## 2023-01-27 NOTE — Progress Notes (Addendum)
PROGRESS NOTE    [name]  [id number] DOB: [birth year] DOA: 01/25/2023 PCP: Isaac Bliss, Rayford Halsted, MD  35 /F w/ h/o of chronic diastolic CHF, paroxysmal atrial fibrillation on Eliquis, liver cirrhosis, AML (remote);  chronic back pain; COPD; CAD; DM; HTN; HLD; uterine/breast cancer; and CAD presenting with shortness of breath and edema from [facility].  She was discharged from [facility] on 2/2, treated for critical limb ischemia, left foot gangrene, had balloon angioplasty 1/25 of ostial SFA lesion, proximal SFA lesion and underwent partial first ray amputation by Dr. Amalia Hailey on 1/21.  -In the ER hypoxic, hemoglobin down to 7.0, creatinine 2.1, albumin 2, chest x-ray with cardiomegaly, small pleural effusions, significant abdominal and upper thigh edema on exam   Subjective: Short of breath, tired   Assessment and Plan:  Anasarca Acute on chronic diastolic CHF -Patient with known h/o chronic diastolic CHF presenting with worsening edema, anasarca, profound upper thigh swelling -Echo on 12/23 with preserved EF, normal RV and aortic valve calcification -Has significant hypoalbuminemia, abdominal CT 5/23 indicated liver cirrhosis this is contributing to significant third spacing  -Continue IV Lasix today, increase dose to 80 Mg twice daily, 2 doses of albumin today -Will request cardiology input -GDMT limited by AKI/CKD -BMP in a.m.  Stage 4 CKD - baseline creat 1.8-2,  -Creatinine relatively stable, monitor with diuresis   Liver cirrhosis -? NASH, vs CHF related, concern for cirrhosis noted on CT imaging 5/23, has severe hypoalbuminemia -Diuretics as above  Acute on chronic anemia -Baseline hemoglobin in the 7-7.9 range, now 6.9 this morning, patient denies overt blood loss will transfuse 1 unit of PRBC, check anemia panel   Persistent Afib -Rate controlled, -Continue Eliquis  PAD, Recent left foot gangrene, critical limb ischemia -had balloon angioplasty 1/25 of ostial  SFA lesion, proximal SFA lesion and underwent partial first ray amputation by Dr. Amalia Hailey on 1/21.  -Plavix, Eliquis, statin   H/o AML and recent breast cancer -Followed by Dr. [name], last seen 7/24 -She is in clinical remission and osseous changes on prior imaging are thought to be residual from AML treatment  -She has a port-a-cath because she wanted to keep it, currently accessed -Continue letrozole for now   Seizure d/o -Continue Keppra   COPD -Uses Breo prn; will continue   HLD -Continue Lipitor   DM -CBGs stable, continue current regimen   Chronic pain -Continue  Robaxin, Oxy per home regimen   CAD -s/p stent,  Too high risk for CABG in 2018 -Continue Plavix, apixaban and statin, hold Imdur   Chronic thrombocytopenia -With mild worsening, will trend  ETHICS: Ill debilitated patient with diastolic CHF, multiple cancers currently in remission, COPD, CKD 4, liver cirrhosis, frequent hospitalizations related to fluid overload and other issues, may need palliative care eval this admission, will monitor progress  DVT prophylaxis: Apixaban Code Status: DNR Family Communication: No family at bedside Disposition Plan: Back to SNF once stable  Consultants:    Procedures:   Antimicrobials:    Objective: Vitals:   01/27/23 0758 01/27/23 0815 01/27/23 0818 01/27/23 0833  BP: 139/81  139/81 (!) 136/55  Pulse: 89  89 83  Resp: 18  18 18  $ Temp: 97.8 F (36.6 C)  97.8 F (36.6 C) 97.9 F (36.6 C)  TempSrc: Oral  Oral Oral  SpO2: 100% 98% 100% 96%  Weight:      Height:        Intake/Output Summary (Last 24 hours) at 01/27/2023 1008 Last data filed  at 01/27/2023 0525 Gross per 24 hour  Intake 240 ml  Output 700 ml  Net -460 ml   Filed Weights   01/13/2023 1051 01/25/2023 2126 01/27/23 0336  Weight: 71.2 kg 75.5 kg 75.5 kg    Examination:  General exam: Obese chronically ill female laying in bed, AAOx3 HEENT: Neck obese unable to assess JVD CVS: S1-S2,  regular rhythm Lungs: Decreased breath sounds at the bases  Abdomen: Obese,, firm, distended with edema Extremities: 3+ edema in upper thighs Psychiatry:  Mood & affect appropriate.     Data Reviewed:   CBC: Recent Labs  Lab 01/09/2023 1145 01/27/23 0330  WBC 4.0 4.7  NEUTROABS 3.0  --   HGB 7.0* 6.9*  HCT 22.2* 22.9*  MCV 102.3* 104.1*  PLT 141* [id number]   Basic Metabolic Panel: Recent Labs  Lab 01/22/2023 1145 01/10/2023 1935 01/27/23 0330  NA 141  --  142  K 4.8  --  4.7  CL 109  --  110  CO2 22  --  24  GLUCOSE 103*  --  159*  BUN 49*  --  49*  CREATININE 2.13*  --  2.23*  CALCIUM 7.1*  --  7.0*  MG  --  1.5*  --   PHOS  --   --  4.5   GFR: Estimated Creatinine Clearance: 18.8 mL/min (A) (by C-G formula based on SCr of 2.23 mg/dL (H)). Liver Function Tests: Recent Labs  Lab 01/08/2023 1145 01/27/23 0330  AST 16  --   ALT 11  --   ALKPHOS 106  --   BILITOT 0.5  --   PROT 5.0*  --   ALBUMIN 2.0* 2.1*   No results for input(s): "LIPASE", "AMYLASE" in the last 168 hours. No results for input(s): "AMMONIA" in the last 168 hours. Coagulation Profile: No results for input(s): "INR", "PROTIME" in the last 168 hours. Cardiac Enzymes: No results for input(s): "CKTOTAL", "CKMB", "CKMBINDEX", "TROPONINI" in the last 168 hours. BNP (last 3 results) No results for input(s): "PROBNP" in the last 8760 hours. HbA1C: No results for input(s): "HGBA1C" in the last 72 hours. CBG: Recent Labs  Lab 01/17/2023 1530 02/04/2023 2305 01/27/23 0733  GLUCAP 76 172* 128*   Lipid Profile: No results for input(s): "CHOL", "HDL", "LDLCALC", "TRIG", "CHOLHDL", "LDLDIRECT" in the last 72 hours. Thyroid Function Tests: No results for input(s): "TSH", "T4TOTAL", "FREET4", "T3FREE", "THYROIDAB" in the last 72 hours. Anemia Panel: No results for input(s): "VITAMINB12", "FOLATE", "FERRITIN", "TIBC", "IRON", "RETICCTPCT" in the last 72 hours. Urine analysis:    Component Value Date/Time    COLORURINE YELLOW 12/07/2022 0019   APPEARANCEUR CLOUDY (A) 12/07/2022 0019   LABSPEC 1.013 12/07/2022 0019   LABSPEC 1.015 08/04/2014 1439   PHURINE 5.0 12/07/2022 0019   GLUCOSEU NEGATIVE 12/07/2022 0019   GLUCOSEU Negative 08/04/2014 1439   HGBUR SMALL (A) 12/07/2022 0019   HGBUR negative 10/10/2010 1607   BILIRUBINUR NEGATIVE 12/07/2022 0019   BILIRUBINUR Negative 08/04/2014 1439   KETONESUR NEGATIVE 12/07/2022 0019   PROTEINUR 30 (A) 12/07/2022 0019   UROBILINOGEN 0.2 08/04/2014 1439   NITRITE NEGATIVE 12/07/2022 0019   LEUKOCYTESUR LARGE (A) 12/07/2022 0019   LEUKOCYTESUR Negative 08/04/2014 1439   Sepsis Labs: @LABRCNTIP$ (procalcitonin:4,lacticidven:4)  ) Recent Results (from the past 240 hour(s))  Resp panel by RT-PCR (RSV, Flu A&B, Covid) Anterior Nasal Swab     Status: None   Collection Time: 01/11/2023 11:11 AM   Specimen: Anterior Nasal Swab  Result Value Ref Range Status  SARS Coronavirus 2 by RT PCR NEGATIVE NEGATIVE Final   Influenza A by PCR NEGATIVE NEGATIVE Final   Influenza B by PCR NEGATIVE NEGATIVE Final    Comment: (NOTE) The Xpert Xpress SARS-CoV-2/FLU/RSV plus assay is intended as an aid in the diagnosis of influenza from Nasopharyngeal swab specimens and should not be used as a sole basis for treatment. Nasal washings and aspirates are unacceptable for Xpert Xpress SARS-CoV-2/FLU/RSV testing.  Fact Sheet for Patients: EntrepreneurPulse.com.au  Fact Sheet for Healthcare Providers: IncredibleEmployment.be  This test is not yet approved or cleared by the [place] FDA and has been authorized for detection and/or diagnosis of SARS-CoV-2 by FDA under an Emergency Use Authorization (EUA). This EUA will remain in effect (meaning this test can be used) for the duration of the COVID-19 declaration under Section 564(b)(1) of the Act, 21 U.S.C. section 360bbb-3(b)(1), unless the authorization is terminated  or revoked.     Resp Syncytial Virus by PCR NEGATIVE NEGATIVE Final    Comment: (NOTE) Fact Sheet for Patients: EntrepreneurPulse.com.au  Fact Sheet for Healthcare Providers: IncredibleEmployment.be  This test is not yet approved or cleared by the [place] FDA and has been authorized for detection and/or diagnosis of SARS-CoV-2 by FDA under an Emergency Use Authorization (EUA). This EUA will remain in effect (meaning this test can be used) for the duration of the COVID-19 declaration under Section 564(b)(1) of the Act, 21 U.S.C. section 360bbb-3(b)(1), unless the authorization is terminated or revoked.  Performed at [facility], [place] [address], [place], [place] [address]      Radiology Studies: DG Chest 2 View  Result Date: 01/21/2023 CLINICAL DATA:  Shortness of breath EXAM: CHEST - 2 VIEW COMPARISON:  01/01/2023 x-ray FINDINGS: Underinflation. Once again there is an enlarged cardiopericardial silhouette with a calcified aorta and right upper chest port. Increasing small right effusion right-greater-than-left and vascular congestion. No pneumothorax. Overlapping cardiac leads. IMPRESSION: Worsening inflation with increasing small effusions and opacities. Recommend follow-up Electronically Signed   By: Jill Side M.D.   On: 01/09/2023 13:23     Scheduled Meds:  apixaban  2.5 mg Oral BID   atorvastatin  40 mg Oral Daily   calcium acetate  1,334 mg Oral TID WC   docusate sodium  100 mg Oral BID   ferrous sulfate  325 mg Oral BID WC   folic acid  1 mg Oral Daily   furosemide  80 mg Intravenous BID   gabapentin  100 mg Oral TID   insulin aspart  0-9 Units Subcutaneous TID WC   ipratropium-albuterol  3 mL Nebulization QID   isosorbide mononitrate  30 mg Oral Daily   letrozole  2.5 mg Oral Daily   levETIRAcetam  500 mg Oral BID   potassium chloride  10 mEq Oral BID   Continuous Infusions:  albumin human       LOS: 0 days     Time spent: 62mn    PDomenic Polite MD [facility]   01/27/2023, 10:08 AM

## 2023-01-28 DIAGNOSIS — E8770 Fluid overload, unspecified: Secondary | ICD-10-CM | POA: Diagnosis not present

## 2023-01-28 LAB — BPAM RBC
Blood Product Expiration Date: 202402252359
ISSUE DATE / TIME: 202402200812
Unit Type and Rh: 9500

## 2023-01-28 LAB — GLUCOSE, CAPILLARY
Glucose-Capillary: 207 mg/dL — ABNORMAL HIGH (ref 70–99)
Glucose-Capillary: 82 mg/dL (ref 70–99)
Glucose-Capillary: 223 mg/dL — ABNORMAL HIGH (ref 70–99)
Glucose-Capillary: 169 mg/dL — ABNORMAL HIGH (ref 70–99)

## 2023-01-28 LAB — CBC
HCT: 24.3 % — ABNORMAL LOW (ref 36.0–46.0)
Hemoglobin: 8 g/dL — ABNORMAL LOW (ref 12.0–15.0)
MCH: 32.5 pg (ref 26.0–34.0)
MCHC: 32.9 g/dL (ref 30.0–36.0)
MCV: 98.8 fL (ref 80.0–100.0)
Platelets: 147 10*3/uL — ABNORMAL LOW (ref 150–400)
RBC: 2.46 MIL/uL — ABNORMAL LOW (ref 3.87–5.11)
RDW: 19.9 % — ABNORMAL HIGH (ref 11.5–15.5)
WBC: 6.3 10*3/uL (ref 4.0–10.5)
nRBC: 0 % (ref 0.0–0.2)

## 2023-01-28 LAB — RENAL FUNCTION PANEL
Albumin: 3 g/dL — ABNORMAL LOW (ref 3.5–5.0)
Anion gap: 9 (ref 5–15)
BUN: 57 mg/dL — ABNORMAL HIGH (ref 8–23)
CO2: 23 mmol/L (ref 22–32)
Calcium: 7.3 mg/dL — ABNORMAL LOW (ref 8.9–10.3)
Chloride: 110 mmol/L (ref 98–111)
Creatinine, Ser: 2.27 mg/dL — ABNORMAL HIGH (ref 0.44–1.00)
GFR, Estimated: 21 mL/min — ABNORMAL LOW (ref >60)
Glucose, Bld: 183 mg/dL — ABNORMAL HIGH (ref 70–99)
Phosphorus: 5.1 mg/dL — ABNORMAL HIGH (ref 2.5–4.6)
Potassium: 4.7 mmol/L (ref 3.5–5.1)
Sodium: 142 mmol/L (ref 135–145)

## 2023-01-28 LAB — TYPE AND SCREEN
ABO/RH(D): O NEG
Antibody Screen: NEGATIVE
Unit division: 0

## 2023-01-28 LAB — COMPREHENSIVE METABOLIC PANEL
ALT: 9 U/L (ref 0–44)
AST: 15 U/L (ref 15–41)
Albumin: 3 g/dL — ABNORMAL LOW (ref 3.5–5.0)
Alkaline Phosphatase: 87 U/L (ref 38–126)
Anion gap: 9 (ref 5–15)
BUN: 59 mg/dL — ABNORMAL HIGH (ref 8–23)
CO2: 23 mmol/L (ref 22–32)
Calcium: 7.3 mg/dL — ABNORMAL LOW (ref 8.9–10.3)
Chloride: 109 mmol/L (ref 98–111)
Creatinine, Ser: 2.25 mg/dL — ABNORMAL HIGH (ref 0.44–1.00)
GFR, Estimated: 21 mL/min — ABNORMAL LOW (ref >60)
Glucose, Bld: 183 mg/dL — ABNORMAL HIGH (ref 70–99)
Potassium: 4.6 mmol/L (ref 3.5–5.1)
Sodium: 141 mmol/L (ref 135–145)
Total Bilirubin: 1 mg/dL (ref 0.3–1.2)
Total Protein: 5.5 g/dL — ABNORMAL LOW (ref 6.5–8.1)

## 2023-01-28 MED ORDER — IPRATROPIUM-ALBUTEROL 0.5-2.5 (3) MG/3ML IN SOLN
3.0000 mL | Freq: Four times a day (QID) | RESPIRATORY_TRACT | Status: DC | PRN
  Administered 2023-01-29: 3 mL via RESPIRATORY_TRACT
  Filled 2023-01-28: qty 3

## 2023-01-28 MED ORDER — IPRATROPIUM-ALBUTEROL 0.5-2.5 (3) MG/3ML IN SOLN
3.0000 mL | Freq: Two times a day (BID) | RESPIRATORY_TRACT | Status: DC
  Administered 2023-01-29 – 2023-01-30 (×3): 3 mL via RESPIRATORY_TRACT
  Filled 2023-01-28 (×3): qty 3

## 2023-01-28 MED ORDER — INSULIN GLARGINE-YFGN 100 UNIT/ML ~~LOC~~ SOLN
15.0000 [IU] | Freq: Every day | SUBCUTANEOUS | Status: DC
  Administered 2023-01-28 – 2023-01-29 (×2): 15 [IU] via SUBCUTANEOUS
  Filled 2023-01-28 (×2): qty 0.15

## 2023-01-28 MED ORDER — INSULIN GLARGINE-YFGN 100 UNIT/ML ~~LOC~~ SOLN
15.0000 [IU] | Freq: Every day | SUBCUTANEOUS | Status: DC
  Filled 2023-01-28: qty 0.15

## 2023-01-28 MED ORDER — ENSURE ENLIVE PO LIQD
237.0000 mL | Freq: Two times a day (BID) | ORAL | Status: DC
  Administered 2023-01-28 – 2023-01-30 (×4): 237 mL via ORAL

## 2023-01-28 MED ORDER — INSULIN GLARGINE-YFGN 100 UNIT/ML ~~LOC~~ SOLN: 20.0000 [IU] | Freq: Every day | SUBCUTANEOUS | Status: DC

## 2023-01-28 NOTE — Consult Note (Signed)
[place] Nurse Consult Note: Reason for Consult:Left great toe amputation site. Amputation performed on 01/03/23 by Dr. Amalia Hailey (Podiatric Medicine). Last seen in follow up by that provider in the office on 01/14/23. Wound type:surgical Pressure Injury POA: N/A Wound [id number] approximated incision line Drainage (amount, consistency, odor) N/A Periwound: intact Dressing procedure/placement/frequency: I will provide conservative orders for the daily care of this site using a NS cleanse, dry and cover of the incision line with xeroform gauze (antimicrobial, nonadherent). This is to be topped with dry gauze and secured with a Kerlix roll gauze/paper tape. The bilateral heels are to be floated and a sacral silicone foam placed for PI prevention in these two vulnerable areas. Please consult with Dr. Amalia Hailey if desired for further assessments as needed while in house.  [place] to follow with Dr Amalia Hailey as previously directed for follow up in the outpatient setting.  [place] nursing team will not follow, but will remain available to this patient, the nursing and medical teams.  Please re-consult if needed.  Thank you for inviting [place] to participate in this patient's Plan of Care.  Maudie Flakes, MSN, RN, CNS, [place], Serita Grammes, [employer], [employer] phone:  [phone]

## 2023-01-28 NOTE — Consult Note (Signed)
   [facility] Inpatient Consult   01/28/2023  [name] [birth year] [id number]  [place] Organization [ACO] Patient: [place] Medicare   Primary Care Provider:  Isaac Bliss, Rayford Halsted, MD   Patient screened for hospitalization with noted *** risk score for unplanned readmission risk ***length of stay and *** to assess for potential [place] Management service needs for post hospital transition for care coordination.  Review of patient's electronic medical record reveals patient is ***   Plan:  ***Continue to follow progress and disposition to assess for post hospital community care coordination/management needs.  ***Referral request for community care coordination:  ***Of note, [facility] Care Management/Population Health does not replace or interfere with any arrangements made by the Inpatient Transition of Care team.  For questions contact:   [name], RN BSN [place]  [phone] business mobile phone Toll free office [phone]  *[place]  [phone] Fax number: [phone] [email or web address]

## 2023-01-28 NOTE — Progress Notes (Signed)
Rounding Note    Patient Name: [name] Date of Encounter: 01/28/2023  [place] Cardiologist: Mertie Moores, MD   Subjective   Fairly comfortable laying in bed  Inpatient Medications    Scheduled Meds:  apixaban  2.5 mg Oral BID   atorvastatin  40 mg Oral Daily   calcium acetate  1,334 mg Oral TID WC   cholecalciferol  1,000 Units Oral Daily   docusate sodium  100 mg Oral BID   feeding supplement  237 mL Oral BID BM   ferrous sulfate  325 mg Oral Q breakfast   folic acid  1 mg Oral Daily   furosemide  80 mg Intravenous BID   gabapentin  100 mg Oral TID   insulin aspart  0-9 Units Subcutaneous TID WC   insulin glargine-yfgn  15 Units Subcutaneous Daily   ipratropium-albuterol  3 mL Nebulization TID   letrozole  2.5 mg Oral Daily   levETIRAcetam  500 mg Oral BID   potassium chloride  10 mEq Oral BID   Continuous Infusions:  PRN Meds: acetaminophen **OR** acetaminophen, fluticasone furoate-vilanterol, LORazepam, melatonin, nitroGLYCERIN, mouth rinse, oxyCODONE, prochlorperazine   Vital Signs    Vitals:   01/28/23 0429 01/28/23 0628 01/28/23 0738 01/28/23 1131  BP: (!) 148/61  (!) 150/61 (!) 143/66  Pulse: 99  (!) 104 91  Resp: 18     Temp: 97.8 F (36.6 C)  98 F (36.7 C) 98 F (36.7 C)  TempSrc: Oral  Oral Oral  SpO2: 100% 96% 92% 99%  Weight: 75.8 kg     Height:        Intake/Output Summary (Last 24 hours) at 01/28/2023 1211 Last data filed at 01/28/2023 0300 Gross per 24 hour  Intake 73.15 ml  Output 1400 ml  Net -1326.85 ml      01/28/2023    4:29 AM 01/27/2023    3:36 AM 01/25/2023    9:26 PM  Last 3 Weights  Weight (lbs) 167 lb 1.7 oz 166 lb 7.2 oz 166 lb 7.2 oz  Weight (kg) 75.8 kg 75.5 kg 75.5 kg     Physical Exam   Diffuse anasarca.  Alert and orient x 3.  Labs    High Sensitivity Troponin:   Recent Labs  Lab 01/16/2023 1935 02/03/2023 2205  TROPONINIHS 12 12     Chemistry Recent Labs  Lab 01/19/2023 1145  01/21/2023 1935 01/27/23 0330 01/28/23 0620  NA 141  --  142 142  141  K 4.8  --  4.7 4.7  4.6  CL 109  --  110 110  109  CO2 22  --  24 23  23  $ GLUCOSE 103*  --  159* 183*  183*  BUN 49*  --  49* 57*  59*  CREATININE 2.13*  --  2.23* 2.27*  2.25*  CALCIUM 7.1*  --  7.0* 7.3*  7.3*  MG  --  1.5*  --   --   PROT 5.0*  --   --  5.5*  ALBUMIN 2.0*  --  2.1* 3.0*  3.0*  AST 16  --   --  15  ALT 11  --   --  9  ALKPHOS 106  --   --  87  BILITOT 0.5  --   --  1.0  GFRNONAA 23*  --  22* 21*  21*  ANIONGAP 10  --  8 9  9    $ Lipids No results for input(s): "CHOL", "  TRIG", "HDL", "LABVLDL", "LDLCALC", "CHOLHDL" in the last 168 hours.  Hematology Recent Labs  Lab 01/30/2023 1145 01/27/23 0330 01/27/23 1341 01/28/23 0620  WBC 4.0 4.7  --  6.3  RBC 2.17* 2.20* 2.53* 2.46*  HGB 7.0* 6.9*  --  8.0*  HCT 22.2* 22.9*  --  24.3*  MCV 102.3* 104.1*  --  98.8  MCH 32.3 31.4  --  32.5  MCHC 31.5 30.1  --  32.9  RDW 17.3* 17.4*  --  19.9*  PLT 141* 173  --  147*   Thyroid No results for input(s): "TSH", "FREET4" in the last 168 hours.  BNP Recent Labs  Lab 01/11/2023 2205  BNP 677.3*    DDimer No results for input(s): "DDIMER" in the last 168 hours.   Radiology    DG Chest 2 View  Result Date: 01/28/2023 CLINICAL DATA:  Shortness of breath EXAM: CHEST - 2 VIEW COMPARISON:  01/01/2023 x-ray FINDINGS: Underinflation. Once again there is an enlarged cardiopericardial silhouette with a calcified aorta and right upper chest port. Increasing small right effusion right-greater-than-left and vascular congestion. No pneumothorax. Overlapping cardiac leads. IMPRESSION: Worsening inflation with increasing small effusions and opacities. Recommend follow-up Electronically Signed   By: Jill Side M.D.   On: 01/12/2023 13:23     Assessment & Plan    Anasarca, low albumin, chronic diastolic heart failure - Reviewed Dr. Delene Loll note, perhaps this is secondary to cirrhosis.  Prior  abdominal CT on 5/23 indicated liver cirrhosis. - Continuing with Lasix 80 mg twice a day.  Albumin IV was given.  Protein intake.  Stage IV chronic kidney disease - Creatinine 2.2.  Stable.  Persistent atrial fibrillation - Rate controlled,  on Eliquis.      For questions or updates, please contact [place] Please consult [email or web address] for contact info under        Signed, Candee Furbish, MD  01/28/2023, 12:11 PM

## 2023-01-28 NOTE — Progress Notes (Signed)
PROGRESS NOTE    [name]  [id number] DOB: [birth year] DOA: 01/18/2023 PCP: Isaac Bliss, Rayford Halsted, MD  38 /F w/ h/o of chronic diastolic CHF, paroxysmal atrial fibrillation on Eliquis, liver cirrhosis, AML (remote);  chronic back pain; COPD; CAD; DM; HTN; HLD; uterine/breast cancer; and CAD presenting with shortness of breath and edema from [facility] SN[facility].  She was discharged from [facility] on 2/2, treated for critical limb ischemia, left foot gangrene, had balloon angioplasty 1/25 of ostial SFA lesion, proximal SFA lesion and underwent partial first ray amputation by Dr. Amalia Hailey on 1/21.  -In the ER hypoxic, hemoglobin down to 7.0, creatinine 2.1, albumin 2, chest x-ray with cardiomegaly, small pleural effusions, significant abdominal and upper thigh edema on exam   Subjective: Short of breath, tired   Assessment and Plan:  Anasarca, severe hypoalbuminemia Acute on chronic diastolic CHF -Patient with known h/o chronic diastolic CHF presenting with anasarca, profound upper thigh swelling, flank and lower abdominal swelling -Echo on 12/23 with preserved EF, normal RV and aortic valve calcification -Has significant hypoalbuminemia, abdominal CT 5/23 indicated liver cirrhosis this is contributing to significant third spacing  -Continue IV Lasix 80 Mg twice daily, given 2 doses of IV albumin yesterday, appreciate cardiology input, increase protein intake -Weights likely inaccurate  -GDMT limited by AKI/CKD, BMP in a.m. -PT OT eval  Stage 4 CKD - baseline creat 1.8-2,  -Creatinine relatively stable, monitor with diuresis   Liver cirrhosis -? NASH, vs CHF related, concern for cirrhosis noted on CT imaging 5/23, has severe hypoalbuminemia -Diuretics as above  Acute on chronic anemia -Baseline hemoglobin in the 7-7.9 range, now 6.9 yesterday morning, patient denies overt blood loss will transfused 1 unit of PRBC yesterday, anemia panel suggestive of chronic disease -CBC in a.m.    Persistent Afib -Rate controlled, -Continue Eliquis  PAD, Recent left foot gangrene, critical limb ischemia -had balloon angioplasty 1/25 of ostial SFA lesion, proximal SFA lesion and underwent partial first ray amputation by Dr. Amalia Hailey on 1/21.  -Plavix, Eliquis, statin   H/o AML and recent breast cancer -Followed by Dr. [name], last seen 7/24 -She is in clinical remission and osseous changes on prior imaging are thought to be residual from AML treatment  -She has a port-a-cath because she wanted to keep it, currently accessed -Continue letrozole for now   Seizure d/o -Continue Keppra   COPD -Uses Breo prn; will continue   HLD -Continue Lipitor   DM -CBGs stable, continue current regimen   Chronic pain -Continue  Robaxin, Oxy per home regimen   CAD -s/p stent,  Too high risk for CABG in 2018 -Continue Plavix, apixaban and statin, hold Imdur   Chronic thrombocytopenia -With mild worsening, will trend  ETHICS: Ill debilitated patient with diastolic CHF, multiple cancers currently in remission, COPD, CKD 4, liver cirrhosis, frequent hospitalizations related to fluid overload and other issues, may need palliative care eval this admission, will monitor progress  DVT prophylaxis: Apixaban Code Status: DNR Family Communication: No family at bedside Disposition Plan: Back to SNF once stable  Consultants: Cards   Procedures:   Antimicrobials:    Objective: Vitals:   01/28/23 0049 01/28/23 0429 01/28/23 0628 01/28/23 0738  BP: (!) 153/59 (!) 148/61  (!) 150/61  Pulse: 82 99  (!) 104  Resp: 18 18    Temp: 97.9 F (36.6 C) 97.8 F (36.6 C)  98 F (36.7 C)  TempSrc: Oral Oral  Oral  SpO2: 100% 100% 96% 92%  Weight:  75.8  kg    Height:        Intake/Output Summary (Last 24 hours) at 01/28/2023 1112 Last data filed at 01/28/2023 0300 Gross per 24 hour  Intake 73.15 ml  Output 1400 ml  Net -1326.85 ml   Filed Weights   01/10/2023 2126 01/27/23 0336 01/28/23  0429  Weight: 75.5 kg 75.5 kg 75.8 kg    Examination:  General exam: Obese chronically ill female laying in the bed, AAOx3 HEENT: Neck obese unable to assess JVD CVS: S1-S2, regular rhythm Lungs: Decreased breath sounds at the bases Abdomen: Obese, firm, distended with lower abdominal edema Extremities: 2+ edema in upper thighs  Psychiatry:  Mood & affect appropriate.     Data Reviewed:   CBC: Recent Labs  Lab 02/03/2023 1145 01/27/23 0330 01/28/23 0620  WBC 4.0 4.7 6.3  NEUTROABS 3.0  --   --   HGB 7.0* 6.9* 8.0*  HCT 22.2* 22.9* 24.3*  MCV 102.3* 104.1* 98.8  PLT 141* 173 [id number]*   Basic Metabolic Panel: Recent Labs  Lab 01/29/2023 1145 01/16/2023 1935 01/27/23 0330 01/28/23 0620  NA 141  --  142 142  141  K 4.8  --  4.7 4.7  4.6  CL 109  --  110 110  109  CO2 22  --  24 23  23  $ GLUCOSE 103*  --  159* 183*  183*  BUN 49*  --  49* 57*  59*  CREATININE 2.13*  --  2.23* 2.27*  2.25*  CALCIUM 7.1*  --  7.0* 7.3*  7.3*  MG  --  1.5*  --   --   PHOS  --   --  4.5 5.1*   GFR: Estimated Creatinine Clearance: 18.5 mL/min (A) (by C-G formula based on SCr of 2.27 mg/dL (H)). Liver Function Tests: Recent Labs  Lab 01/14/2023 1145 01/27/23 0330 01/28/23 0620  AST 16  --  15  ALT 11  --  9  ALKPHOS 106  --  87  BILITOT 0.5  --  1.0  PROT 5.0*  --  5.5*  ALBUMIN 2.0* 2.1* 3.0*  3.0*   No results for input(s): "LIPASE", "AMYLASE" in the last 168 hours. No results for input(s): "AMMONIA" in the last 168 hours. Coagulation Profile: No results for input(s): "INR", "PROTIME" in the last 168 hours. Cardiac Enzymes: No results for input(s): "CKTOTAL", "CKMB", "CKMBINDEX", "TROPONINI" in the last 168 hours. BNP (last 3 results) No results for input(s): "PROBNP" in the last 8760 hours. HbA1C: No results for input(s): "HGBA1C" in the last 72 hours. CBG: Recent Labs  Lab 01/27/23 1120 01/27/23 1614 01/27/23 1758 01/27/23 2118 01/28/23 0556  GLUCAP 118* 271*  209* 161* 207*   Lipid Profile: No results for input(s): "CHOL", "HDL", "LDLCALC", "TRIG", "CHOLHDL", "LDLDIRECT" in the last 72 hours. Thyroid Function Tests: No results for input(s): "TSH", "T4TOTAL", "FREET4", "T3FREE", "THYROIDAB" in the last 72 hours. Anemia Panel: Recent Labs    01/27/23 1100 01/27/23 1340 01/27/23 1341  VITAMINB12 664  --   --   FOLATE >40.0  --   --   FERRITIN  --  307  --   TIBC  --  136*  --   IRON  --  124  --   RETICCTPCT  --   --  1.7   Urine analysis:    Component Value Date/Time   COLORURINE YELLOW 12/07/2022 0019   APPEARANCEUR CLOUDY (A) 12/07/2022 0019   LABSPEC 1.013 12/07/2022 0019   LABSPEC 1.015 08/04/2014  [place] 5.0 12/07/2022 0019   GLUCOSEU NEGATIVE 12/07/2022 0019   GLUCOSEU Negative 08/04/2014 1439   HGBUR SMALL (A) 12/07/2022 0019   HGBUR negative 10/10/2010 1607   BILIRUBINUR NEGATIVE 12/07/2022 0019   BILIRUBINUR Negative 08/04/2014 1439   KETONESUR NEGATIVE 12/07/2022 0019   PROTEINUR 30 (A) 12/07/2022 0019   UROBILINOGEN 0.2 08/04/2014 1439   NITRITE NEGATIVE 12/07/2022 0019   LEUKOCYTESUR LARGE (A) 12/07/2022 0019   LEUKOCYTESUR Negative 08/04/2014 1439   Sepsis Labs: @LABRCNTIP$ (procalcitonin:4,lacticidven:4)  ) Recent Results (from the past 240 hour(s))  Resp panel by RT-PCR (RSV, Flu A&B, Covid) Anterior Nasal Swab     Status: None   Collection Time: 01/29/2023 11:11 AM   Specimen: Anterior Nasal Swab  Result Value Ref Range Status   SARS Coronavirus 2 by RT PCR NEGATIVE NEGATIVE Final   Influenza A by PCR NEGATIVE NEGATIVE Final   Influenza B by PCR NEGATIVE NEGATIVE Final    Comment: (NOTE) The Xpert Xpress SARS-CoV-2/FLU/RSV plus assay is intended as an aid in the diagnosis of influenza from Nasopharyngeal swab specimens and should not be used as a sole basis for treatment. Nasal washings and aspirates are unacceptable for Xpert Xpress SARS-CoV-2/FLU/RSV testing.  Fact Sheet for  Patients: EntrepreneurPulse.com.au  Fact Sheet for Healthcare Providers: IncredibleEmployment.be  This test is not yet approved or cleared by the [place] FDA and has been authorized for detection and/or diagnosis of SARS-CoV-2 by FDA under an Emergency Use Authorization (EUA). This EUA will remain in effect (meaning this test can be used) for the duration of the COVID-19 declaration under Section 564(b)(1) of the Act, 21 U.S.C. section 360bbb-3(b)(1), unless the authorization is terminated or revoked.     Resp Syncytial Virus by PCR NEGATIVE NEGATIVE Final    Comment: (NOTE) Fact Sheet for Patients: EntrepreneurPulse.com.au  Fact Sheet for Healthcare Providers: IncredibleEmployment.be  This test is not yet approved or cleared by the [place] FDA and has been authorized for detection and/or diagnosis of SARS-CoV-2 by FDA under an Emergency Use Authorization (EUA). This EUA will remain in effect (meaning this test can be used) for the duration of the COVID-19 declaration under Section 564(b)(1) of the Act, 21 U.S.C. section 360bbb-3(b)(1), unless the authorization is terminated or revoked.  Performed at [facility], [place] [address] [place], [place] [address]      Radiology Studies: DG Chest 2 View  Result Date: 01/09/2023 CLINICAL DATA:  Shortness of breath EXAM: CHEST - 2 VIEW COMPARISON:  01/01/2023 x-ray FINDINGS: Underinflation. Once again there is an enlarged cardiopericardial silhouette with a calcified aorta and right upper chest port. Increasing small right effusion right-greater-than-left and vascular congestion. No pneumothorax. Overlapping cardiac leads. IMPRESSION: Worsening inflation with increasing small effusions and opacities. Recommend follow-up Electronically Signed   By: Jill Side M.D.   On: 01/16/2023 13:23     Scheduled Meds:  apixaban  2.5 mg Oral BID    atorvastatin  40 mg Oral Daily   calcium acetate  1,334 mg Oral TID WC   cholecalciferol  1,000 Units Oral Daily   docusate sodium  100 mg Oral BID   feeding supplement  237 mL Oral BID BM   ferrous sulfate  325 mg Oral Q breakfast   folic acid  1 mg Oral Daily   furosemide  80 mg Intravenous BID   gabapentin  100 mg Oral TID   insulin aspart  0-9 Units Subcutaneous TID WC   insulin glargine-yfgn  15 Units  Subcutaneous Daily   ipratropium-albuterol  3 mL Nebulization TID   letrozole  2.5 mg Oral Daily   levETIRAcetam  500 mg Oral BID   potassium chloride  10 mEq Oral BID   Continuous Infusions:     LOS: 1 day    Time spent: 37mn    PDomenic Polite MD [facility]   01/28/2023, 11:12 AM

## 2023-01-29 ENCOUNTER — Inpatient Hospital Stay (HOSPITAL_COMMUNITY): Payer: Medicare HMO

## 2023-01-29 DIAGNOSIS — E785 Hyperlipidemia, unspecified: Secondary | ICD-10-CM

## 2023-01-29 DIAGNOSIS — I4819 Other persistent atrial fibrillation: Secondary | ICD-10-CM

## 2023-01-29 DIAGNOSIS — D638 Anemia in other chronic diseases classified elsewhere: Secondary | ICD-10-CM

## 2023-01-29 DIAGNOSIS — R9431 Abnormal electrocardiogram [ECG] [EKG]: Secondary | ICD-10-CM

## 2023-01-29 DIAGNOSIS — I2510 Atherosclerotic heart disease of native coronary artery without angina pectoris: Secondary | ICD-10-CM | POA: Diagnosis not present

## 2023-01-29 DIAGNOSIS — I5033 Acute on chronic diastolic (congestive) heart failure: Secondary | ICD-10-CM | POA: Diagnosis not present

## 2023-01-29 DIAGNOSIS — R569 Unspecified convulsions: Secondary | ICD-10-CM

## 2023-01-29 DIAGNOSIS — C50912 Malignant neoplasm of unspecified site of left female breast: Secondary | ICD-10-CM

## 2023-01-29 DIAGNOSIS — E1169 Type 2 diabetes mellitus with other specified complication: Secondary | ICD-10-CM

## 2023-01-29 DIAGNOSIS — R531 Weakness: Secondary | ICD-10-CM

## 2023-01-29 DIAGNOSIS — I10 Essential (primary) hypertension: Secondary | ICD-10-CM | POA: Diagnosis not present

## 2023-01-29 DIAGNOSIS — E8770 Fluid overload, unspecified: Secondary | ICD-10-CM | POA: Diagnosis not present

## 2023-01-29 DIAGNOSIS — N184 Chronic kidney disease, stage 4 (severe): Secondary | ICD-10-CM

## 2023-01-29 DIAGNOSIS — C9201 Acute myeloblastic leukemia, in remission: Secondary | ICD-10-CM

## 2023-01-29 DIAGNOSIS — E669 Obesity, unspecified: Secondary | ICD-10-CM | POA: Insufficient documentation

## 2023-01-29 DIAGNOSIS — R0602 Shortness of breath: Secondary | ICD-10-CM

## 2023-01-29 DIAGNOSIS — Z66 Do not resuscitate: Secondary | ICD-10-CM

## 2023-01-29 LAB — GLUCOSE, CAPILLARY
Glucose-Capillary: 64 mg/dL — ABNORMAL LOW (ref 70–99)
Glucose-Capillary: 92 mg/dL (ref 70–99)
Glucose-Capillary: 168 mg/dL — ABNORMAL HIGH (ref 70–99)
Glucose-Capillary: 125 mg/dL — ABNORMAL HIGH (ref 70–99)
Glucose-Capillary: 153 mg/dL — ABNORMAL HIGH (ref 70–99)
Glucose-Capillary: 196 mg/dL — ABNORMAL HIGH (ref 70–99)
Glucose-Capillary: 92 mg/dL (ref 70–99)

## 2023-01-29 LAB — RENAL FUNCTION PANEL
Albumin: 2.6 g/dL — ABNORMAL LOW (ref 3.5–5.0)
Anion gap: 8 (ref 5–15)
BUN: 63 mg/dL — ABNORMAL HIGH (ref 8–23)
CO2: 24 mmol/L (ref 22–32)
Calcium: 7.3 mg/dL — ABNORMAL LOW (ref 8.9–10.3)
Chloride: 110 mmol/L (ref 98–111)
Creatinine, Ser: 2.05 mg/dL — ABNORMAL HIGH (ref 0.44–1.00)
GFR, Estimated: 24 mL/min — ABNORMAL LOW (ref >60)
Glucose, Bld: 60 mg/dL — ABNORMAL LOW (ref 70–99)
Phosphorus: 4.8 mg/dL — ABNORMAL HIGH (ref 2.5–4.6)
Potassium: 4.7 mmol/L (ref 3.5–5.1)
Sodium: 142 mmol/L (ref 135–145)

## 2023-01-29 LAB — MAGNESIUM: Magnesium: 1.9 mg/dL (ref 1.7–2.4)

## 2023-01-29 LAB — CBC
HCT: 24.3 % — ABNORMAL LOW (ref 36.0–46.0)
Hemoglobin: 7.8 g/dL — ABNORMAL LOW (ref 12.0–15.0)
MCH: 31.8 pg (ref 26.0–34.0)
MCHC: 32.1 g/dL (ref 30.0–36.0)
MCV: 99.2 fL (ref 80.0–100.0)
Platelets: 141 10*3/uL — ABNORMAL LOW (ref 150–400)
RBC: 2.45 MIL/uL — ABNORMAL LOW (ref 3.87–5.11)
RDW: 18.8 % — ABNORMAL HIGH (ref 11.5–15.5)
WBC: 5.4 10*3/uL (ref 4.0–10.5)
nRBC: 0 % (ref 0.0–0.2)

## 2023-01-29 MED ORDER — INSULIN GLARGINE-YFGN 100 UNIT/ML ~~LOC~~ SOLN
12.0000 [IU] | Freq: Every day | SUBCUTANEOUS | Status: DC
  Filled 2023-01-29: qty 0.12

## 2023-01-29 MED ORDER — LORAZEPAM 0.5 MG PO TABS: 0.5000 mg | ORAL_TABLET | Freq: Three times a day (TID) | ORAL | Status: DC | PRN

## 2023-01-29 MED ORDER — FUROSEMIDE 10 MG/ML IJ SOLN
80.0000 mg | Freq: Three times a day (TID) | INTRAMUSCULAR | Status: DC
  Administered 2023-01-29 – 2023-01-30 (×3): 80 mg via INTRAVENOUS
  Filled 2023-01-29 (×3): qty 8

## 2023-01-29 MED ORDER — CLOPIDOGREL BISULFATE 75 MG PO TABS
75.0000 mg | ORAL_TABLET | Freq: Every day | ORAL | Status: DC
  Administered 2023-01-29 – 2023-01-30 (×2): 75 mg via ORAL
  Filled 2023-01-29 (×2): qty 1

## 2023-01-29 MED ORDER — METOPROLOL SUCCINATE ER 25 MG PO TB24
12.5000 mg | ORAL_TABLET | Freq: Every day | ORAL | Status: DC
  Administered 2023-01-29 – 2023-01-30 (×2): 12.5 mg via ORAL
  Filled 2023-01-29 (×2): qty 1

## 2023-01-29 NOTE — Plan of Care (Signed)
  Problem: Education: Goal: Understanding of CV disease, CV risk reduction, and recovery process will improve Outcome: Progressing Goal: Individualized Educational Video(s) Outcome: Progressing   Problem: Activity: Goal: Ability to return to baseline activity level will improve Outcome: Progressing   Problem: Cardiovascular: Goal: Ability to achieve and maintain adequate cardiovascular perfusion will improve Outcome: Progressing Goal: Vascular access site(s) Level 0-1 will be maintained Outcome: Progressing   Problem: Health Behavior/Discharge Planning: Goal: Ability to safely manage health-related needs after discharge will improve Outcome: Progressing   Problem: Education: Goal: Ability to demonstrate management of disease process will improve Outcome: Progressing Goal: Ability to verbalize understanding of medication therapies will improve Outcome: Progressing Goal: Individualized Educational Video(s) Outcome: Progressing   Problem: Activity: Goal: Capacity to carry out activities will improve Outcome: Progressing   Problem: Cardiac: Goal: Ability to achieve and maintain adequate cardiopulmonary perfusion will improve Outcome: Progressing   Problem: Education: Goal: Ability to describe self-care measures that may prevent or decrease complications (Diabetes Survival Skills Education) will improve Outcome: Progressing Goal: Individualized Educational Video(s) Outcome: Progressing   Problem: Coping: Goal: Ability to adjust to condition or change in health will improve Outcome: Progressing   Problem: Fluid Volume: Goal: Ability to maintain a balanced intake and output will improve Outcome: Progressing   Problem: Health Behavior/Discharge Planning: Goal: Ability to identify and utilize available resources and services will improve Outcome: Progressing Goal: Ability to manage health-related needs will improve Outcome: Progressing   Problem: Metabolic: Goal:  Ability to maintain appropriate glucose levels will improve Outcome: Progressing   Problem: Nutritional: Goal: Maintenance of adequate nutrition will improve Outcome: Progressing Goal: Progress toward achieving an optimal weight will improve Outcome: Progressing   Problem: Skin Integrity: Goal: Risk for impaired skin integrity will decrease Outcome: Progressing   Problem: Tissue Perfusion: Goal: Adequacy of tissue perfusion will improve Outcome: Progressing   Problem: Education: Goal: Knowledge of General Education information will improve Description: Including pain rating scale, medication(s)/side effects and non-pharmacologic comfort measures Outcome: Progressing   Problem: Health Behavior/Discharge Planning: Goal: Ability to manage health-related needs will improve Outcome: Progressing   Problem: Clinical Measurements: Goal: Ability to maintain clinical measurements within normal limits will improve Outcome: Progressing Goal: Will remain free from infection Outcome: Progressing Goal: Diagnostic test results will improve Outcome: Progressing Goal: Respiratory complications will improve Outcome: Progressing Goal: Cardiovascular complication will be avoided Outcome: Progressing   Problem: Activity: Goal: Risk for activity intolerance will decrease Outcome: Progressing   Problem: Nutrition: Goal: Adequate nutrition will be maintained Outcome: Progressing   Problem: Coping: Goal: Level of anxiety will decrease Outcome: Progressing   Problem: Elimination: Goal: Will not experience complications related to bowel motility Outcome: Progressing Goal: Will not experience complications related to urinary retention Outcome: Progressing   Problem: Pain Managment: Goal: General experience of comfort will improve Outcome: Progressing   Problem: Safety: Goal: Ability to remain free from injury will improve Outcome: Progressing   Problem: Skin Integrity: Goal: Risk  for impaired skin integrity will decrease Outcome: Progressing

## 2023-01-29 NOTE — Progress Notes (Addendum)
PROGRESS NOTE  [name] [id number] DOB: January 31, 1941   PCP: Isaac Bliss, Rayford Halsted, MD  Patient is from: SNF. [facility]  DOA: 01/18/2023 LOS: 2  Chief complaints Chief Complaint  Patient presents with   ascites/SOB     Brief Narrative / Interim history: 82 year old F with PMH of diastolic CHF, paroxysmal A-fib on Eliquis, liver cirrhosis, COPD, CAD, DM-2, CKD-4, HTN, HLD, chronic back pain, uterine and breast cancer, AML (remote) and recent hospitalization earlier this month when she was treated for left foot gangrene with critical limb ischemia and had partial first ray amputation by Dr. Events on 1/21 followed by PCI of ostial SFA lesion and proximal SFA lesion on 1/25 by vascular surgery returning with shortness of breath and edema.  In the ER hypoxic, Hgb 7.0 (7.8 on 2/2),  Cr 2.1 (baseline), albumin 2, CXR with cardiomegaly, small pleural effusions, significant abdominal and upper thigh edema on exam.  Started on IV Lasix.  Cardiology consulted.  Subjective: Seen and examined earlier this morning.  No major events overnight of this morning.  Slightly hypoglycemic earlier this morning.  Continues to endorse shortness of breath and dry cough.  She also reports anxiety.  She reports taking Ativan 0.5 mg 3 times a day as needed for anxiety which has been reduced to twice daily as needed.  She denies chest pain, GI or UTI symptoms.  Objective: Vitals:   01/29/23 0612 01/29/23 0724 01/29/23 0815 01/29/23 0842  BP: 99/73  (!) 135/56   Pulse: (!) 35 100 91   Resp: 18  20   Temp: 98 F (36.7 C)  97.7 F (36.5 C)   TempSrc: Oral  Oral   SpO2:   92% 99%  Weight:      Height:        Examination:  GENERAL: No apparent distress.  Nontoxic. HEENT: MMM.  Vision and hearing grossly intact.  NECK: Supple.  No apparent JVD.  RESP:  No IWOB.  Diminished aeration at lung bases. CVS: Irregular rhythm.  Normal rate.  Heart sounds normal.  ABD/GI/GU: BS+. Abd soft, NTND.   MSK/EXT:  Moves extremities. No apparent deformity.  1-2 BLE edema. S/p left ray amputation. SKIN: no apparent skin lesion or wound NEURO: Awake, alert and oriented appropriately.  No apparent focal neuro deficit. PSYCH: Calm. Normal affect.   Procedures:  None  Microbiology summarized:   Assessment and plan: Principal Problem:   Volume overload Active Problems:   Acute on chronic diastolic (congestive) heart failure (HCC)   Essential hypertension   CAD (coronary artery disease)   Persistent atrial fibrillation (HCC)   Type 2 diabetes mellitus with hyperlipidemia (HCC)   Seizure (HCC)   Hyperlipidemia   COPD (chronic obstructive pulmonary disease) (HCC)   DNR (do not resuscitate)   Anemia of chronic disease   CKD (chronic kidney disease) stage 4, GFR 15-29 ml/min (HCC)   AML (acute myeloblastic leukemia) (HCC)   Generalized weakness   Prolonged QT interval   Malignant neoplasm of left female breast (HCC)   Obesity (BMI 30-39.9)  Anasarca, severe hypoalbuminemia, acute on chronic diastolic CHF: Presents with anasarca.  TTE with preserved EF and indeterminate DD, normal RV EF and aortic valve calcification.  CT abdomen and pelvis in 04/2022 suggested liver cirrhosis.  CXR this admission with worsening inflation with increased small effusion and opacities.  She has 1-2 BLE edema on my exam.  Diminished aeration at lung bases.  I&O incomplete but net -2.3 L.  No labs this  morning. -Cardiology following-continue IV Lasix 80 mg 3 times daily.  Received IV albumin intermittently. -GDMT-resume Toprol.  -Abdominal ultrasound to assess ascites. -Strict INO, daily weight, renal functions and electrolytes -PT OT eval -Agree with palliative medicine consultation   CKD-4: Stable. Recent Labs    01/04/23 0549 01/05/23 0031 01/06/23 0635 01/07/23 0500 01/08/23 0506 01/09/23 0400 01/15/2023 1145 01/27/23 0330 01/28/23 0620 01/29/23 0304  BUN 81* 75* 69* 69* 72* 74* 49* 49* 57*   59* 63*  CREATININE 2.12* 2.29* 2.25* 2.52* 2.22* 2.56* 2.13* 2.23* 2.27*  2.25* 2.05*  -Continue monitoring   Liver cirrhosis: CT in 04/2022 suggested liver cirrhosis.  Likely NASH.  TTE does not show right heart failure.  -Management as above -Abdominal ultrasound to assess ascites.   Acute on chronic anemia: Hgb 7.0 on presentation likely due to chronic disease and fluid overload.  Transfused 1 unit with appropriate response. Recent Labs    01/04/23 0549 01/05/23 0031 01/06/23 0635 01/07/23 0500 01/08/23 0506 01/09/23 0400 01/21/2023 1145 01/27/23 0330 01/28/23 0620 01/29/23 0304  HGB 9.4* 8.3* 7.6* 7.9* 7.7* 7.8* 7.0* 6.9* 8.0* 7.8*  -Monitor H&H.  Hgb goal > 8.0 given history of CAD -Check anemia panel in the morning -May give additional 1 unit if Hgb under 8 in the morning  History of CAD s/p stent: too high risk for CABG in 2018 -Continue Plavix, apixaban and statin, hold Imdur -May transfuse additional 1 unit to keep Hgb above 8  Persistent Afib: Irregular rhythm but rate controlled. -Continue Eliquis  -Resume home beta-blocker.   PAD, recent left foot gangrene, critical limb ischemia: had balloon angioplasty 1/25 of ostial SFA lesion, proximal SFA lesion and underwent partial first ray amputation by Dr. Amalia Hailey on 1/21.  -Plavix, Eliquis, statin   H/o AML (remote) and recent breast cancer: followed by Dr. [name], last seen 7/24.  In clinical remission and osseous changes on prior imaging are thought to be residual from AML treatment.  She has a port-a-cath because she wanted to keep it, currently accessed -Continue letrozole for now  Uncontrolled IDDM-2 with hypoglycemia and hyperglycemia: A1c 9.9% in 08/2022. Recent Labs  Lab 01/29/23 0331 01/29/23 0357 01/29/23 0522 01/29/23 0613 01/29/23 1103  GLUCAP 64* 92 168* 125* 153*  -Decrease Semglee from 15 to 12 units per recommendation by diabetic coordinator -Continue SSI -Recheck hemoglobin A1c   Seizure d/o:  Stable. -Continue Keppra   COPD/chronic hypoxic RF: Stable. -Uses Breo prn; will continue   HLD -Continue Lipitor  Prolonged QT: -Minimize QT prolonging drugs -Optimize electrolytes   Chronic pain/anxiety -Continue  Robaxin, Oxy per home regimen -Increase lorazepam to 0.5 mg every 8 hours as needed.  Chronic thrombocytopenia -With mild worsening, will trend  Physical deconditioning: Patient from SNF. -PT/OT  Goal of care: DNR/DNI which is appropriate.  Extensive comorbidity as above.  Poor long-term prognosis.  -Will involve palliative if no progress.   Obesity: Body mass index is 30.65 kg/m.           DVT prophylaxis:  apixaban (ELIQUIS) tablet 2.5 mg Start: 01/31/2023 2215 apixaban (ELIQUIS) tablet 2.5 mg  Code Status: DNR/DNI Family Communication: None at bedside Level of care: Telemetry Cardiac Status is: Inpatient Remains inpatient appropriate because: Anasarca/acute on chronic diastolic CHF   Final disposition: SNF Consultants:  Cardiology  55 minutes with more than 50% spent in reviewing records, counseling patient/family and coordinating care.   Sch Meds:  Scheduled Meds:  apixaban  2.5 mg Oral BID   atorvastatin  40  mg Oral Daily   calcium acetate  1,334 mg Oral TID WC   cholecalciferol  1,000 Units Oral Daily   clopidogrel  75 mg Oral Daily   docusate sodium  100 mg Oral BID   feeding supplement  237 mL Oral BID BM   ferrous sulfate  325 mg Oral Q breakfast   folic acid  1 mg Oral Daily   furosemide  80 mg Intravenous TID   gabapentin  100 mg Oral TID   insulin aspart  0-9 Units Subcutaneous TID WC   [START ON 01/30/2023] insulin glargine-yfgn  12 Units Subcutaneous Daily   ipratropium-albuterol  3 mL Nebulization BID   letrozole  2.5 mg Oral Daily   levETIRAcetam  500 mg Oral BID   metoprolol succinate  12.5 mg Oral Daily   potassium chloride  10 mEq Oral BID   Continuous Infusions: PRN Meds:.acetaminophen **OR** acetaminophen,  fluticasone furoate-vilanterol, ipratropium-albuterol, LORazepam, melatonin, nitroGLYCERIN, mouth rinse, oxyCODONE, prochlorperazine  Antimicrobials: Anti-infectives (From admission, onward)    None        I have personally reviewed the following labs and images: CBC: Recent Labs  Lab 01/25/2023 1145 01/27/23 0330 01/28/23 0620 01/29/23 0304  WBC 4.0 4.7 6.3 5.4  NEUTROABS 3.0  --   --   --   HGB 7.0* 6.9* 8.0* 7.8*  HCT 22.2* 22.9* 24.3* 24.3*  MCV 102.3* 104.1* 98.8 99.2  PLT 141* 173 147* 141*   BMP &GFR Recent Labs  Lab 01/11/2023 1145 01/17/2023 1935 01/27/23 0330 01/28/23 0620 01/29/23 0304  NA 141  --  142 142  141 142  K 4.8  --  4.7 4.7  4.6 4.7  CL 109  --  110 110  109 110  CO2 22  --  24 23  23 24  $ GLUCOSE 103*  --  159* 183*  183* 60*  BUN 49*  --  49* 57*  59* 63*  CREATININE 2.13*  --  2.23* 2.27*  2.25* 2.05*  CALCIUM 7.1*  --  7.0* 7.3*  7.3* 7.3*  MG  --  1.5*  --   --   --   PHOS  --   --  4.5 5.1* 4.8*   Estimated Creatinine Clearance: 20.6 mL/min (A) (by C-G formula based on SCr of 2.05 mg/dL (H)). Liver & Pancreas: Recent Labs  Lab 01/25/2023 1145 01/27/23 0330 01/28/23 0620 01/29/23 0304  AST 16  --  15  --   ALT 11  --  9  --   ALKPHOS 106  --  87  --   BILITOT 0.5  --  1.0  --   PROT 5.0*  --  5.5*  --   ALBUMIN 2.0* 2.1* 3.0*  3.0* 2.6*   No results for input(s): "LIPASE", "AMYLASE" in the last 168 hours. No results for input(s): "AMMONIA" in the last 168 hours. Diabetic: No results for input(s): "HGBA1C" in the last 72 hours. Recent Labs  Lab 01/29/23 0331 01/29/23 0357 01/29/23 0522 01/29/23 0613 01/29/23 1103  GLUCAP 64* 92 168* 125* 153*   Cardiac Enzymes: No results for input(s): "CKTOTAL", "CKMB", "CKMBINDEX", "TROPONINI" in the last 168 hours. No results for input(s): "PROBNP" in the last 8760 hours. Coagulation Profile: No results for input(s): "INR", "PROTIME" in the last 168 hours. Thyroid Function  Tests: No results for input(s): "TSH", "T4TOTAL", "FREET4", "T3FREE", "THYROIDAB" in the last 72 hours. Lipid Profile: No results for input(s): "CHOL", "HDL", "LDLCALC", "TRIG", "CHOLHDL", "LDLDIRECT" in the last 72 hours.  Anemia Panel: Recent Labs    01/27/23 1100 01/27/23 1340 01/27/23 1341  VITAMINB12 664  --   --   FOLATE >40.0  --   --   FERRITIN  --  307  --   TIBC  --  136*  --   IRON  --  124  --   RETICCTPCT  --   --  1.7   Urine analysis:    Component Value Date/Time   COLORURINE YELLOW 12/07/2022 0019   APPEARANCEUR CLOUDY (A) 12/07/2022 0019   LABSPEC 1.013 12/07/2022 0019   LABSPEC 1.015 08/04/2014 1439   PHURINE 5.0 12/07/2022 0019   GLUCOSEU NEGATIVE 12/07/2022 0019   GLUCOSEU Negative 08/04/2014 1439   HGBUR SMALL (A) 12/07/2022 0019   HGBUR negative 10/10/2010 1607   BILIRUBINUR NEGATIVE 12/07/2022 0019   BILIRUBINUR Negative 08/04/2014 1439   KETONESUR NEGATIVE 12/07/2022 0019   PROTEINUR 30 (A) 12/07/2022 0019   UROBILINOGEN 0.2 08/04/2014 1439   NITRITE NEGATIVE 12/07/2022 0019   LEUKOCYTESUR LARGE (A) 12/07/2022 0019   LEUKOCYTESUR Negative 08/04/2014 1439   Sepsis Labs: Invalid input(s): "PROCALCITONIN", "LACTICIDVEN"  Microbiology: Recent Results (from the past 240 hour(s))  Resp panel by RT-PCR (RSV, Flu A&B, Covid) Anterior Nasal Swab     Status: None   Collection Time: 02/03/2023 11:11 AM   Specimen: Anterior Nasal Swab  Result Value Ref Range Status   SARS Coronavirus 2 by RT PCR NEGATIVE NEGATIVE Final   Influenza A by PCR NEGATIVE NEGATIVE Final   Influenza B by PCR NEGATIVE NEGATIVE Final    Comment: (NOTE) The Xpert Xpress SARS-CoV-2/FLU/RSV plus assay is intended as an aid in the diagnosis of influenza from Nasopharyngeal swab specimens and should not be used as a sole basis for treatment. Nasal washings and aspirates are unacceptable for Xpert Xpress SARS-CoV-2/FLU/RSV testing.  Fact Sheet for  Patients: EntrepreneurPulse.com.au  Fact Sheet for Healthcare Providers: IncredibleEmployment.be  This test is not yet approved or cleared by the [place] FDA and has been authorized for detection and/or diagnosis of SARS-CoV-2 by FDA under an Emergency Use Authorization (EUA). This EUA will remain in effect (meaning this test can be used) for the duration of the COVID-19 declaration under Section 564(b)(1) of the Act, 21 U.S.C. section 360bbb-3(b)(1), unless the authorization is terminated or revoked.     Resp Syncytial Virus by PCR NEGATIVE NEGATIVE Final    Comment: (NOTE) Fact Sheet for Patients: EntrepreneurPulse.com.au  Fact Sheet for Healthcare Providers: IncredibleEmployment.be  This test is not yet approved or cleared by the [place] FDA and has been authorized for detection and/or diagnosis of SARS-CoV-2 by FDA under an Emergency Use Authorization (EUA). This EUA will remain in effect (meaning this test can be used) for the duration of the COVID-19 declaration under Section 564(b)(1) of the Act, 21 U.S.C. section 360bbb-3(b)(1), unless the authorization is terminated or revoked.  Performed at [facility], [place] [address], [place], [place] [address]     Radiology Studies: No results found.    Jarika Robben T. Eva  If 7PM-7AM, please contact night-coverage [email or web address] 01/29/2023, 12:21 PM

## 2023-01-29 NOTE — Inpatient Diabetes Management (Signed)
Inpatient Diabetes Program Recommendations  AACE/ADA: New Consensus Statement on Inpatient Glycemic Control (2015)  Target Ranges:  Prepandial:   less than 140 mg/dL      Peak postprandial:   less than 180 mg/dL (1-2 hours)      Critically ill patients:  140 - 180 mg/dL   Lab Results  Component Value Date   GLUCAP 125 (H) 01/29/2023   HGBA1C 9.9 (A) 08/13/2022    Latest Reference Range & Units 01/28/23 05:56 01/28/23 11:38 01/28/23 16:31 01/28/23 21:46 01/29/23 03:31 01/29/23 03:57 01/29/23 05:22 01/29/23 06:13  Glucose-Capillary 70 - 99 mg/dL 207 (H) 82 223 (H) 169 (H) 64 (L) 92 168 (H) 125 (H)  (H): Data is abnormally high (L): Data is abnormally low  Diabetes history: DM2 Outpatient Diabetes medications: Lantus 10 BID, Novolog 2-10 TID.  Current orders for Inpatient glycemic control: Semglee 15 QD, 0-9 TID   Inpatient Diabetes Program Recommendations:   Please consider: -Decrease Semglee 12 units qd  Thank you, Nani Gasser. Rohnan Bartleson, RN, MSN, CDE  Diabetes Coordinator Inpatient Glycemic Control Team Team Pager [phone] (8am-5pm) 01/29/2023 10:29 AM

## 2023-01-29 NOTE — Progress Notes (Signed)
Heart Failure Navigator Progress Note  Assessed for Heart & Vascular TOC clinic readiness.  Patient does not meet criteria due to admission appears more related to anasarca with suggestive liver cirrhosis and severe hypoalbuminemia. Admitted for volume overload. Plan for palliative medicine consult.   HF Navigation team will sign-off.   Pricilla Holm, MSN, RN Heart Failure Nurse Navigator

## 2023-01-29 NOTE — Evaluation (Signed)
Occupational Therapy Evaluation Patient Details Name: [name] MRN: [id number] DOB: March 31, 1941 Today's Date: 01/29/2023   History of Present Illness Pt is an 82 y.o. female admitted 2/19 with ascites, SOB, and volume overload. PMH: AML, pancreatitis, atrial flutter, unspecified cardiomyopathy, CHF, CAD, history of NSTEMI, HTN, stage IV CKD, chronic lower back pain, colon polyps, COPD on oxygen, DMII, nephrolithiasis, uterine cancer, hyperlipidemia, left great toe amputation (Jan 2024)   Clinical Impression   Patient admitted for the diagnosis above.  Most recently undergoing short term rehab at a local SNF s/p great toe amputation.  Currently needing up to Mod A for lower body ADL, and Min A for basic transfers.  Continued discomfort from toe amputation, weight bearing restrictions, and now poor activity tolerance are the deficits.  OT indicated in the acute setting to address deficits listed, and continuation of short term rehab at local SNF to maximize her functional status prior to returning home is recommended.        Recommendations for follow up therapy are one component of a multi-disciplinary discharge planning process, led by the attending physician.  Recommendations may be updated based on patient status, additional functional criteria and insurance authorization.   Follow Up Recommendations  Skilled nursing-short term rehab (<3 hours/day)     Assistance Recommended at Discharge Frequent or constant Supervision/Assistance  Patient can return home with the following A lot of help with walking and/or transfers;A lot of help with bathing/dressing/bathroom;Assistance with cooking/housework;Assist for transportation;Help with stairs or ramp for entrance    Functional Status Assessment  Patient has had a recent decline in their functional status and demonstrates the ability to make significant improvements in function in a reasonable and predictable amount of time.  Equipment  Recommendations  BSC/3in1    Recommendations for Other Services       Precautions / Restrictions Precautions Precautions: Fall;Other (comment) Precaution Comments: watch sats Restrictions Weight Bearing Restrictions: Yes LLE Weight Bearing: Partial weight bearing LLE Partial Weight Bearing Percentage or Pounds: heel WB      Mobility Bed Mobility   Bed Mobility: Sit to Supine       Sit to supine: Min assist     Patient Response: Cooperative  Transfers Overall transfer level: Needs assistance Equipment used: Rolling walker (2 wheels) Transfers: Sit to/from Stand, Bed to chair/wheelchair/BSC Sit to Stand: Min assist     Step pivot transfers: Min assist            Balance Overall balance assessment: Needs assistance Sitting-balance support: Feet supported, No upper extremity supported Sitting balance-Leahy Scale: Good     Standing balance support: Reliant on assistive device for balance Standing balance-Leahy Scale: Poor                             ADL either performed or assessed with clinical judgement   ADL Overall ADL's : Needs assistance/impaired Eating/Feeding: Set up;Sitting   Grooming: Wash/dry hands;Wash/dry face;Set up;Sitting   Upper Body Bathing: Set up;Sitting   Lower Body Bathing: Moderate assistance;Sit to/from stand   Upper Body Dressing : Sitting;Minimal assistance   Lower Body Dressing: Moderate assistance;Sit to/from stand   Toilet Transfer: Minimal assistance;Stand-pivot;BSC/3in1;Rolling walker (2 wheels)                   Vision Patient Visual Report: No change from baseline       Perception     Praxis      Pertinent Vitals/Pain Pain  Assessment Pain Assessment: Faces Faces Pain Scale: Hurts a little bit Pain Location: LLE Pain Descriptors / Indicators: Tender Pain Intervention(s): Monitored during session     Hand Dominance Right   Extremity/Trunk Assessment Upper Extremity Assessment Upper  Extremity Assessment: Generalized weakness   Lower Extremity Assessment Lower Extremity Assessment: Defer to PT evaluation LLE Deficits / Details: s/p L great toe amp in Jan 2024. Dressing in place.   Cervical / Trunk Assessment Cervical / Trunk Assessment: Kyphotic   Communication Communication Communication: No difficulties   Cognition Arousal/Alertness: Awake/alert Behavior During Therapy: WFL for tasks assessed/performed, Impulsive Overall Cognitive Status: Within Functional Limits for tasks assessed                           Safety/Judgement: Decreased awareness of safety           General Comments  SpO2 89-92% on 3L    Exercises     Shoulder Instructions      Home Living Family/patient expects to be discharged to:: Skilled nursing facility Living Arrangements: Spouse/significant other Available Help at Discharge: Family;Available 24 hours/day Type of Home: House Home Access: Stairs to enter [employer] of Steps: 1  step Entrance Stairs-Rails: Can reach both Home Layout: One level     Bathroom Shower/Tub: Teacher, early years/pre: Handicapped height     Home Equipment: Conservation officer, nature (2 wheels);Tub bench;Wheelchair - manual   Additional Comments: Pt has been at [facility] for rehab since previous admission.      Prior Functioning/Environment Prior Level of Function : Independent/Modified Independent;Working/employed;Driving             Mobility Comments: uses RW ADLs Comments: spouse assists with ADLs, as needed        OT Problem List: Decreased strength;Decreased range of motion;Decreased activity tolerance;Impaired balance (sitting and/or standing);Decreased safety awareness      OT Treatment/Interventions: Self-care/ADL training;Therapeutic exercise;Energy conservation;DME and/or AE instruction;Therapeutic activities;Patient/family education;Balance training    OT Goals(Current goals can be found in the  care plan section) Acute Rehab OT Goals Patient Stated Goal: Finish rehab and return home OT Goal Formulation: With patient Time For Goal Achievement: 02/12/23 Potential to Achieve Goals: Good ADL Goals Pt Will Perform Grooming: with supervision;standing Pt Will Perform Lower Body Dressing: with supervision;sit to/from stand Pt Will Transfer to Toilet: with supervision;ambulating;regular height toilet Pt/caregiver will Perform Home Exercise Program: Increased strength;Both right and left upper extremity;With theraband;With Supervision  OT Frequency: Min 2X/week    Co-evaluation              AM-PAC OT "6 Clicks" Daily Activity     Outcome Measure Help from another person eating meals?: None Help from another person taking care of personal grooming?: A Little Help from another person toileting, which includes using toliet, bedpan, or urinal?: A Lot Help from another person bathing (including washing, rinsing, drying)?: A Lot Help from another person to put on and taking off regular upper body clothing?: A Little Help from another person to put on and taking off regular lower body clothing?: A Lot 6 Click Score: 16   End of Session Equipment Utilized During Treatment: Rolling walker (2 wheels);Oxygen Nurse Communication: Mobility status  Activity Tolerance: Patient tolerated treatment well Patient left: in bed;with call bell/phone within reach;with bed alarm set;with family/visitor present  OT Visit Diagnosis: Unsteadiness on feet (R26.81);Other abnormalities of gait and mobility (R26.89);Muscle weakness (generalized) (M62.81)  Time: 1132-1150 OT Time Calculation (min): 18 min Charges:  OT General Charges $OT Visit: 1 Visit OT Evaluation $OT Eval Moderate Complexity: 1 Mod  01/29/2023  RP, OTR/L  Acute Rehabilitation Services  Office:  [phone]   Metta Clines 01/29/2023, 11:53 AM

## 2023-01-29 NOTE — Evaluation (Signed)
Physical Therapy Evaluation Patient Details Name: [name] MRN: [id number] DOB: [birth year] Today's Date: 01/29/2023  History of Present Illness  Pt is an 82 y.o. female admitted 2/19 with ascites, SOB, and volume overload. PMH: AML, pancreatitis, atrial flutter, unspecified cardiomyopathy, CHF, CAD, history of NSTEMI, HTN, stage IV CKD, chronic lower back pain, colon polyps, COPD on oxygen, DMII, nephrolithiasis, uterine cancer, hyperlipidemia, left great toe amputation (Jan 2024)   Clinical Impression  Pt admitted with above diagnosis. PTA pt was at [facility] for rehab. Pt underwent L great toe in Jan 2024 and discharged to SNF following that hospitalization. At baseline, pt lives at home with spouse, mod I mobility with RW. Pt currently with functional limitations due to the deficits listed below (see PT Problem List). On eval, pt required mod assist bed mobility, min assist sit to stand, and min assist SPT with RW. Deficits noted in strength, balance, and activity tolerance. Unable to progress gait due to fatigue, SOB/DOE. Pt on 3L continuous O2 with SpO2 89-92%. Pt will benefit from skilled PT to increase their independence and safety with mobility to allow discharge to the venue listed below.          Recommendations for follow up therapy are one component of a multi-disciplinary discharge planning process, led by the attending physician.  Recommendations may be updated based on patient status, additional functional criteria and insurance authorization.  Follow Up Recommendations Skilled nursing-short term rehab (<3 hours/day) Can patient physically be transported by private vehicle: No    Assistance Recommended at Discharge Frequent or constant Supervision/Assistance  Patient can return home with the following  A lot of help with walking and/or transfers;A lot of help with bathing/dressing/bathroom;Help with stairs or ramp for entrance;Assist for transportation    Equipment  Recommendations None recommended by PT  Recommendations for Other Services       Functional Status Assessment Patient has had a recent decline in their functional status and demonstrates the ability to make significant improvements in function in a reasonable and predictable amount of time.     Precautions / Restrictions Precautions Precautions: Fall;Other (comment) Precaution Comments: watch sats Restrictions LLE Weight Bearing: Partial weight bearing LLE Partial Weight Bearing Percentage or Pounds: heel WB      Mobility  Bed Mobility Overal bed mobility: Needs Assistance Bed Mobility: Supine to Sit     Supine to sit: HOB elevated, Mod assist     General bed mobility comments: assist with BLE and trunk, +rail, increased time, use of bed pad to scoot to EOB    Transfers Overall transfer level: Needs assistance Equipment used: Rolling walker (2 wheels) Transfers: Sit to/from Stand, Bed to chair/wheelchair/BSC Sit to Stand: Min assist   Step pivot transfers: Min assist       General transfer comment: assist to power up, pivot transfer with RW bed to recliner toward L    Ambulation/Gait               General Gait Details: unable due to SOB/DOE, fatigue  Stairs            Wheelchair Mobility    Modified Rankin (Stroke Patients Only)       Balance Overall balance assessment: Needs assistance Sitting-balance support: Feet supported, No upper extremity supported Sitting balance-Leahy Scale: Good     Standing balance support: Bilateral upper extremity supported, Reliant on assistive device for balance, During functional activity Standing balance-Leahy Scale: Poor  Pertinent Vitals/Pain Pain Assessment Pain Assessment: Faces Faces Pain Scale: Hurts a little bit Pain Location: LLE Pain Descriptors / Indicators: Sore Pain Intervention(s): Limited activity within patient's tolerance, Monitored during  session, Repositioned    Home Living Family/patient expects to be discharged to:: Private residence Living Arrangements: Spouse/significant other Available Help at Discharge: Family;Available 24 hours/day Type of Home: House Home Access: Stairs to enter Entrance Stairs-Rails: Can reach both Entrance Stairs-Number of Steps: 1  step   Home Layout: One level Home Equipment: Conservation officer, nature (2 wheels);Tub bench;Wheelchair - manual Additional Comments: Pt has been at [facility] for rehab since previous admission.    Prior Function Prior Level of Function : Independent/Modified Independent;Working/employed;Driving             Mobility Comments: uses RW ADLs Comments: spouse assists with ADLs, as needed     Hand Dominance   Dominant Hand: Right    Extremity/Trunk Assessment   Upper Extremity Assessment Upper Extremity Assessment: Defer to OT evaluation    Lower Extremity Assessment Lower Extremity Assessment: Generalized weakness;LLE deficits/detail LLE Deficits / Details: s/p L great toe amp in Jan 2024. Dressing in place.    Cervical / Trunk Assessment Cervical / Trunk Assessment: Kyphotic  Communication   Communication: No difficulties  Cognition Arousal/Alertness: Awake/alert Behavior During Therapy: WFL for tasks assessed/performed, Anxious Overall Cognitive Status: No family/caregiver present to determine baseline cognitive functioning Area of Impairment: Safety/judgement, Memory, Problem solving                     Memory: Decreased short-term memory   Safety/Judgement: Decreased awareness of safety   Problem Solving: Difficulty sequencing, Requires verbal cues          General Comments General comments (skin integrity, edema, etc.): SpO2 89-92% on 3L    Exercises     Assessment/Plan    PT Assessment Patient needs continued PT services  PT Problem List Decreased strength;Decreased activity tolerance;Decreased balance;Decreased  mobility;Decreased skin integrity;Pain;Cardiopulmonary status limiting activity       PT Treatment Interventions DME instruction;Gait training;Stair training;Functional mobility training;Therapeutic activities;Therapeutic exercise;Balance training;Patient/family education    PT Goals (Current goals can be found in the Care Plan section)  Acute Rehab PT Goals Patient Stated Goal: home PT Goal Formulation: With patient Time For Goal Achievement: 02/12/23 Potential to Achieve Goals: Fair    Frequency Min 2X/week     Co-evaluation               AM-PAC PT "6 Clicks" Mobility  Outcome Measure Help needed turning from your back to your side while in a flat bed without using bedrails?: A Little Help needed moving from lying on your back to sitting on the side of a flat bed without using bedrails?: A Lot Help needed moving to and from a bed to a chair (including a wheelchair)?: A Little Help needed standing up from a chair using your arms (e.g., wheelchair or bedside chair)?: A Little Help needed to walk in hospital room?: Total Help needed climbing 3-5 steps with a railing? : Total 6 Click Score: 13    End of Session Equipment Utilized During Treatment: Gait belt Activity Tolerance: Patient tolerated treatment well Patient left: in chair;with call bell/phone within reach;with chair alarm set Nurse Communication: Mobility status PT Visit Diagnosis: Other abnormalities of gait and mobility (R26.89);Pain Pain - Right/Left: Left Pain - part of body: Leg    Time: [id number] PT Time Calculation (min) (ACUTE ONLY): 29 min   Charges:  PT Evaluation $PT Eval Moderate Complexity: 1 Mod PT Treatments $Therapeutic Activity: 8-22 mins        Gloriann Loan., PT  Office # [phone]   Lorriane Shire 01/29/2023, 9:33 AM

## 2023-01-29 NOTE — Progress Notes (Addendum)
Rounding Note    Patient Name: [name] Date of Encounter: 01/29/2023  [place] Cardiologist: Mertie Moores, MD   Subjective   No acute overnight events. Patient still looks short of breath but states she is doing better than yesterday. Edema improving.   Inpatient Medications    Scheduled Meds:  apixaban  2.5 mg Oral BID   atorvastatin  40 mg Oral Daily   calcium acetate  1,334 mg Oral TID WC   cholecalciferol  1,000 Units Oral Daily   clopidogrel  75 mg Oral Daily   docusate sodium  100 mg Oral BID   feeding supplement  237 mL Oral BID BM   ferrous sulfate  325 mg Oral Q breakfast   folic acid  1 mg Oral Daily   furosemide  80 mg Intravenous TID   gabapentin  100 mg Oral TID   insulin aspart  0-9 Units Subcutaneous TID WC   [START ON 01/30/2023] insulin glargine-yfgn  12 Units Subcutaneous Daily   ipratropium-albuterol  3 mL Nebulization BID   letrozole  2.5 mg Oral Daily   levETIRAcetam  500 mg Oral BID   metoprolol succinate  12.5 mg Oral Daily   potassium chloride  10 mEq Oral BID   Continuous Infusions:  PRN Meds: acetaminophen **OR** acetaminophen, fluticasone furoate-vilanterol, ipratropium-albuterol, LORazepam, melatonin, nitroGLYCERIN, mouth rinse, oxyCODONE, prochlorperazine   Vital Signs    Vitals:   01/29/23 0612 01/29/23 0724 01/29/23 0815 01/29/23 0842  BP: 99/73  (!) 135/56   Pulse: (!) 35 100 91   Resp: 18  20   Temp: 98 F (36.7 C)  97.7 F (36.5 C)   TempSrc: Oral  Oral   SpO2:   92% 99%  Weight:      Height:        Intake/Output Summary (Last 24 hours) at 01/29/2023 1228 Last data filed at 01/29/2023 0942 Gross per 24 hour  Intake 474 ml  Output 1000 ml  Net -526 ml      01/29/2023    4:08 AM 01/29/2023   12:18 AM 01/28/2023    4:29 AM  Last 3 Weights  Weight (lbs) 167 lb 8.8 oz 168 lb 14 oz 167 lb 1.7 oz  Weight (kg) 76 kg 76.6 kg 75.8 kg      Telemetry    Atrial fibrillation with rates in the 90s to low  100s. One 10 beat run of NSVT noted. - Personally Reviewed  ECG    No new ECG tracing today. - Personally Reviewed  Physical Exam   Physical Exam per MD:  GEN: Elderly Caucasian female who appear chronically ill.    Cardiac: Irregularly irregular rhythm with normal rate. No murmurs, rubs, or gallops.  Respiratory: Mild increased work of breathing. Clear to auscultation anteriorly. No significant wheezes, rhonchi, or rales appreciated. GI: Soft, non-distended, and non-tender. MS: 2+ pitting edema of bilateral lower extremities.  Skin: Extremely pale but warm and dry. Neuro:  No focal deficits.   Psych: Normal affect. Responds appropriately.  Labs    High Sensitivity Troponin:   Recent Labs  Lab 01/20/2023 1935 01/20/2023 2205  TROPONINIHS 12 12     Chemistry Recent Labs  Lab 02/04/2023 1145 01/14/2023 1935 01/27/23 0330 01/28/23 0620 01/29/23 0304  NA 141  --  142 142  141 142  K 4.8  --  4.7 4.7  4.6 4.7  CL 109  --  110 110  109 110  CO2 22  --  $'24 23  23 24  'p$ GLUCOSE 103*  --  159* 183*  183* 60*  BUN 49*  --  49* 57*  59* 63*  CREATININE 2.13*  --  2.23* 2.27*  2.25* 2.05*  CALCIUM 7.1*  --  7.0* 7.3*  7.3* 7.3*  MG  --  1.5*  --   --   --   PROT 5.0*  --   --  5.5*  --   ALBUMIN 2.0*  --  2.1* 3.0*  3.0* 2.6*  AST 16  --   --  15  --   ALT 11  --   --  9  --   ALKPHOS 106  --   --  87  --   BILITOT 0.5  --   --  1.0  --   GFRNONAA 23*  --  22* 21*  21* 24*  ANIONGAP 10  --  '8 9  9 8    '$ Lipids No results for input(s): "CHOL", "TRIG", "HDL", "LABVLDL", "LDLCALC", "CHOLHDL" in the last 168 hours.  Hematology Recent Labs  Lab 01/27/23 0330 01/27/23 1341 01/28/23 0620 01/29/23 0304  WBC 4.7  --  6.3 5.4  RBC 2.20* 2.53* 2.46* 2.45*  HGB 6.9*  --  8.0* 7.8*  HCT 22.9*  --  24.3* 24.3*  MCV 104.1*  --  98.8 99.2  MCH 31.4  --  32.5 31.8  MCHC 30.1  --  32.9 32.1  RDW 17.4*  --  19.9* 18.8*  PLT 173  --  147* 141*   Thyroid No results for  input(s): "TSH", "FREET4" in the last 168 hours.  BNP Recent Labs  Lab 01/17/2023 2205  BNP 677.3*    DDimer No results for input(s): "DDIMER" in the last 168 hours.   Radiology    No results found.  Cardiac Studies   TTE 10/16/2022: Impressions: 1. Left ventricular ejection fraction, by estimation, is 60 to 65%. The  left ventricle has normal function. The left ventricle has no regional  wall motion abnormalities. There is mild left ventricular hypertrophy.  Left ventricular diastolic parameters  are indeterminate.   2. Right ventricular systolic function is normal. The right ventricular  size is normal. There is mildly elevated pulmonary artery systolic  pressure.   3. Left atrial size was moderately dilated.   4. The mitral valve is degenerative. Mild mitral valve regurgitation. No  evidence of mitral stenosis. Severe mitral annular calcification.   5. Tricuspid valve regurgitation is moderate to severe.   6. The aortic valve is normal in structure. There is severe calcifcation  of the aortic valve. There is severe thickening of the aortic valve.  Aortic valve regurgitation is not visualized. Mild to moderate aortic  valve stenosis.   7. The inferior vena cava is normal in size with greater than 50%  respiratory variability, suggesting right atrial pressure of 3 mmHg.  _______________  TEE 11/19/2022: Impressions:  1. Left ventricular ejection fraction, by estimation, is 60 to 65%. The  left ventricle has normal function. Left ventricular diastolic function  could not be evaluated.   2. Right ventricular systolic function is normal. The right ventricular  size is normal.   3. Left atrial size was mild to moderately dilated. No left atrial/left  atrial appendage thrombus was detected.   4. The mitral valve is normal in structure. Trivial mitral valve  regurgitation.   5. Tricuspid valve regurgitation is moderate.   6. The aortic valve is calcified. There is moderate  calcification of the  aortic valve. Aortic valve regurgitation is not visualized.   7. There is Moderate (Grade III) plaque.   Patient Profile     82 y.o. female with a history of CAD with NSTEMI s/p DES to RCA in 2018, chronic combined CHF with EF of 40-45% at time of NSTEMI but has since normalized, persistent atrial fibrillation/ flutter on Eliquis, PAD s/p left femoropopliteal angioplasty and stenting in 12/2022, cirrhosis, hypertension, hyperlipidemia, type 2 diabetes mellitus, CKD stage IIIb, COPD, uterine cancer, breast cancer, and AML in remission who was admitted on 02/04/2023 for acute on chronic CHF after presenting with dyspnea and ascites.   Assessment & Plan    Acute on Chronic Combined CHF Anasarca Patient has a history of chronic CHF. LVEF as low as 40-45% in 2018 at time of NSTEMI but has since normalized. Last Echo in 111/2023 showed LVEF of 60-65%. Patient presented with significant dyspnea and ascites. BNP elevated at 677. Chest x-ray showed worsening inflation with increasing small effusions and opacities.She was started on IV Lasix. Net negative 1.995 L this admission. Renal function slightly better today.  - Edema is improving but still volume overloaded. - Will increase IV Lasix to '80mg'$  three times daily.  - Agree with restarting Toprol-XL 12.'5mg'$  daily. - No ACEi/ ARB/ ARNI, Spironolactone, or SGLT2 due to renal function. - Continue to monitor daily weights, strict I/Os, and renal function. - She also has known cirrhosis which is contributing. Will defer management of this to primary team.   CAD History of NSTEMI in 2018 s/p DES to RCA.  - No angina. - On Plavix given PCI with recent intervention. - Continue statin.   Persistent Atrial Fibrillation NSVT Telemetry shows atrial fibrillation with rates mostly in the 90s (occasionally up to the 110s). Also showed one 10 beat run of NSVT. Potassium 4.7 today. Magnesium 1.5 on admission and it does not look like this has  been repleted. - Will recheck Magnesium today and anticipate repleting.  - Agree with restarting Toprol-XL 12.'5mg'$  daily. - Continue chronic anticoagulation with Eliquis 2.'5mg'$  twice daily. Reduced dose given age and renal function.  PAD S/p  left femoropopliteal angioplasty and stenting in 12/2022. - Continue Plavix, Eliquis, and statin.  Hypertension She had one soft BP reading of 99/73 this morning but otherwise mostly in the 130s/50s. - Agree with restarting home Toprol-XL 12.'5mg'$  daily. - Continue diuresis as above.  Hyperlipidemia - Continue Lipitor '40mg'$  daily.  CKD IV Creatinine 2.13 on admission which is around baseline. Peaked at 2.27 yesterday but improved to 2.05 today. - Continue to monitor closely with diuresis.  Otherwise, per primary team: - COPD - Cirrhosis - Acute on chronic anemia: Hgb 6.9 on 2/20 s/p 1 unit of PRBCs. Hgb stable at 7.8 today - History of AML and breast cancer - Seizure disorder - Type 2 diabetes mellitus - Chronic pain - Chronic thrombocytopenia   For questions or updates, please contact [facility] Please consult [email or web address] for contact info under        Signed, Darreld Mclean, PA-C  01/29/2023, 12:28 PM    Personally seen and examined. Agree with above.  -Continuing with diuresis.  We increase Lasix to 3 times a day.  Watch for hypotension.  Cirrhosis playing a role.  Albumin in the low 2 range.  Candee Furbish, MD

## 2023-01-30 DIAGNOSIS — R0602 Shortness of breath: Principal | ICD-10-CM

## 2023-01-30 DIAGNOSIS — I2510 Atherosclerotic heart disease of native coronary artery without angina pectoris: Secondary | ICD-10-CM | POA: Diagnosis not present

## 2023-01-30 DIAGNOSIS — E8770 Fluid overload, unspecified: Secondary | ICD-10-CM | POA: Diagnosis not present

## 2023-01-30 DIAGNOSIS — G9341 Metabolic encephalopathy: Secondary | ICD-10-CM | POA: Insufficient documentation

## 2023-01-30 DIAGNOSIS — Z515 Encounter for palliative care: Secondary | ICD-10-CM

## 2023-01-30 DIAGNOSIS — I10 Essential (primary) hypertension: Secondary | ICD-10-CM | POA: Diagnosis not present

## 2023-01-30 DIAGNOSIS — I5033 Acute on chronic diastolic (congestive) heart failure: Secondary | ICD-10-CM | POA: Diagnosis not present

## 2023-01-30 LAB — RENAL FUNCTION PANEL
Albumin: 2.3 g/dL — ABNORMAL LOW (ref 3.5–5.0)
Anion gap: 8 (ref 5–15)
BUN: 75 mg/dL — ABNORMAL HIGH (ref 8–23)
CO2: 24 mmol/L (ref 22–32)
Calcium: 7.1 mg/dL — ABNORMAL LOW (ref 8.9–10.3)
Chloride: 109 mmol/L (ref 98–111)
Creatinine, Ser: 2.23 mg/dL — ABNORMAL HIGH (ref 0.44–1.00)
GFR, Estimated: 22 mL/min — ABNORMAL LOW (ref >60)
Glucose, Bld: 162 mg/dL — ABNORMAL HIGH (ref 70–99)
Phosphorus: 5.4 mg/dL — ABNORMAL HIGH (ref 2.5–4.6)
Potassium: 5.3 mmol/L — ABNORMAL HIGH (ref 3.5–5.1)
Sodium: 141 mmol/L (ref 135–145)

## 2023-01-30 LAB — CBC
HCT: 23.8 % — ABNORMAL LOW (ref 36.0–46.0)
Hemoglobin: 7.2 g/dL — ABNORMAL LOW (ref 12.0–15.0)
MCH: 31 pg (ref 26.0–34.0)
MCHC: 30.3 g/dL (ref 30.0–36.0)
MCV: 102.6 fL — ABNORMAL HIGH (ref 80.0–100.0)
Platelets: 127 10*3/uL — ABNORMAL LOW (ref 150–400)
RBC: 2.32 MIL/uL — ABNORMAL LOW (ref 3.87–5.11)
RDW: 17.7 % — ABNORMAL HIGH (ref 11.5–15.5)
WBC: 4.2 10*3/uL (ref 4.0–10.5)
nRBC: 0 % (ref 0.0–0.2)

## 2023-01-30 LAB — GLUCOSE, CAPILLARY
Glucose-Capillary: 57 mg/dL — ABNORMAL LOW (ref 70–99)
Glucose-Capillary: 165 mg/dL — ABNORMAL HIGH (ref 70–99)
Glucose-Capillary: 164 mg/dL — ABNORMAL HIGH (ref 70–99)

## 2023-01-30 LAB — TSH: TSH: 4.529 u[IU]/mL — ABNORMAL HIGH (ref 0.350–4.500)

## 2023-01-30 LAB — IRON AND TIBC
Iron: 23 ug/dL — ABNORMAL LOW (ref 28–170)
Saturation Ratios: 18 % (ref 10.4–31.8)
TIBC: 130 ug/dL — ABNORMAL LOW (ref 250–450)
UIBC: 107 ug/dL

## 2023-01-30 LAB — HEMOGLOBIN A1C
Hgb A1c MFr Bld: 5.4 % (ref 4.8–5.6)
Mean Plasma Glucose: 108.28 mg/dL

## 2023-01-30 LAB — VITAMIN B12: Vitamin B-12: 466 pg/mL (ref 180–914)

## 2023-01-30 LAB — BRAIN NATRIURETIC PEPTIDE: B Natriuretic Peptide: 894.8 pg/mL — ABNORMAL HIGH (ref 0.0–100.0)

## 2023-01-30 LAB — RETICULOCYTES
Immature Retic Fract: 18.3 % — ABNORMAL HIGH (ref 2.3–15.9)
RBC.: 2.33 MIL/uL — ABNORMAL LOW (ref 3.87–5.11)
Retic Count, Absolute: 31.9 10*3/uL (ref 19.0–186.0)
Retic Ct Pct: 1.4 % (ref 0.4–3.1)

## 2023-01-30 LAB — MAGNESIUM: Magnesium: 1.7 mg/dL (ref 1.7–2.4)

## 2023-01-30 LAB — FERRITIN: Ferritin: 262 ng/mL (ref 11–307)

## 2023-01-30 LAB — FOLATE: Folate: 29.1 ng/mL (ref >5.9)

## 2023-01-30 MED ORDER — HALOPERIDOL 0.5 MG PO TABS: 0.5000 mg | ORAL_TABLET | ORAL | Status: DC | PRN

## 2023-01-30 MED ORDER — ACETAMINOPHEN 325 MG PO TABS
650.0000 mg | ORAL_TABLET | Freq: Four times a day (QID) | ORAL | Status: DC | PRN
  Administered 2023-01-30: 650 mg via ORAL
  Filled 2023-01-30: qty 2

## 2023-01-30 MED ORDER — BISACODYL 10 MG RE SUPP: 10.0000 mg | Freq: Every day | RECTAL | Status: DC | PRN

## 2023-01-30 MED ORDER — BIOTENE DRY MOUTH MT LIQD: 15.0000 mL | OROMUCOSAL | Status: DC | PRN

## 2023-01-30 MED ORDER — HALOPERIDOL LACTATE 5 MG/ML IJ SOLN: 0.5000 mg | INTRAMUSCULAR | Status: DC | PRN

## 2023-01-30 MED ORDER — LORAZEPAM 2 MG/ML IJ SOLN: 1.0000 mg | INTRAMUSCULAR | Status: DC | PRN

## 2023-01-30 MED ORDER — INSULIN GLARGINE-YFGN 100 UNIT/ML ~~LOC~~ SOLN
10.0000 [IU] | Freq: Every day | SUBCUTANEOUS | Status: DC
  Administered 2023-01-30: 10 [IU] via SUBCUTANEOUS
  Filled 2023-01-30: qty 0.1

## 2023-01-30 MED ORDER — METOLAZONE 5 MG PO TABS
5.0000 mg | ORAL_TABLET | Freq: Every day | ORAL | Status: DC
  Administered 2023-01-30: 5 mg via ORAL
  Filled 2023-01-30: qty 1

## 2023-01-30 MED ORDER — ONDANSETRON HCL 4 MG/2ML IJ SOLN: 4.0000 mg | Freq: Four times a day (QID) | INTRAMUSCULAR | Status: DC | PRN

## 2023-01-30 MED ORDER — LORAZEPAM 2 MG/ML PO CONC: 1.0000 mg | ORAL | Status: DC | PRN

## 2023-01-30 MED ORDER — POLYVINYL ALCOHOL 1.4 % OP SOLN: 1.0000 [drp] | Freq: Four times a day (QID) | OPHTHALMIC | Status: DC | PRN

## 2023-01-30 MED ORDER — HYDROMORPHONE HCL 1 MG/ML IJ SOLN
0.5000 mg | INTRAMUSCULAR | Status: DC | PRN
  Administered 2023-01-30 – 2023-01-31 (×3): 0.5 mg via INTRAVENOUS
  Filled 2023-01-30 (×3): qty 0.5

## 2023-01-30 MED ORDER — SODIUM CHLORIDE 0.9 % IV SOLN: 12.5000 mg | Freq: Four times a day (QID) | INTRAVENOUS | Status: DC | PRN

## 2023-01-30 MED ORDER — LORAZEPAM 1 MG PO TABS: 1.0000 mg | ORAL_TABLET | ORAL | Status: DC | PRN

## 2023-01-30 MED ORDER — ALBUMIN HUMAN 25 % IV SOLN
25.0000 g | Freq: Once | INTRAVENOUS | Status: AC
  Administered 2023-01-30: 25 g via INTRAVENOUS
  Filled 2023-01-30: qty 100

## 2023-01-30 MED ORDER — HALOPERIDOL LACTATE 2 MG/ML PO CONC: 0.5000 mg | ORAL | Status: DC | PRN

## 2023-01-30 MED ORDER — ONDANSETRON 4 MG PO TBDP: 4.0000 mg | ORAL_TABLET | Freq: Four times a day (QID) | ORAL | Status: DC | PRN

## 2023-01-30 MED ORDER — ACETAMINOPHEN 650 MG RE SUPP: 650.0000 mg | Freq: Four times a day (QID) | RECTAL | Status: DC | PRN

## 2023-01-30 NOTE — Progress Notes (Signed)
PROGRESS NOTE  [name] [id number] DOB: Nov 19, 1941   PCP: Isaac Bliss, Rayford Halsted, MD  Patient is from: SNF. [facility]  DOA: 01/29/2023 LOS: 3  Chief complaints Chief Complaint  Patient presents with   ascites/SOB     Brief Narrative / Interim history: 82 year old F with PMH of diastolic CHF, paroxysmal A-fib on Eliquis, liver cirrhosis, COPD, CAD, DM-2, CKD-4, HTN, HLD, chronic back pain, uterine and breast cancer, AML (remote) and recent hospitalization earlier this month when she was treated for left foot gangrene with critical limb ischemia and had partial first ray amputation by Dr. Events on 1/21 followed by PCI of ostial SFA lesion and proximal SFA lesion on 1/25 by vascular surgery returning with shortness of breath and edema.  In the ER hypoxic, Hgb 7.0 (7.8 on 2/2),  Cr 2.1 (baseline), albumin 2, CXR with cardiomegaly, small pleural effusions, significant abdominal and upper thigh edema on exam.  Started on IV Lasix.  Cardiology consulted.  Patient was diuresed with IV Lasix, and also received IV albumin intermittently for hypoalbuminemia.  However, she remained fluid overloaded with worsening renal function, anemia and encephalopathy.  Eventually, she was transitioned to full comfort care on 01/30/2023 after discussion with patient's husband and sister, [name].   Subjective: Seen and examined earlier this morning.  No major events overnight of this morning.  Feels tired and sleepy.  Reports shortness of breath.  She has episode of hypoglycemia to 57 earlier this morning.  Creatinine up to 2.23.  BUN is 75.  Objective: Vitals:   01/29/23 2033 01/30/23 0008 01/30/23 0403 01/30/23 0635  BP:  (!) 90/48  110/80  Pulse:  77  77  Resp:  20  20  Temp:  98.1 F (36.7 C)  98.1 F (36.7 C)  TempSrc:  Oral  Oral  SpO2: 98% 100%    Weight:  77.5 kg 77.5 kg   Height:        Examination:  GENERAL: Sleepy and somewhat somnolent. HEENT: MMM.  Vision and hearing grossly  intact.  NECK: Supple.  No apparent JVD.  RESP:  No IWOB.  Diminished aeration bilaterally. CVS:  RRR. Heart sounds normal.  ABD/GI/GU: BS+. Abd soft, NTND.  MSK/EXT:    2+ BLE edema. S/p first left transportation. SKIN: no apparent skin lesion or wound NEURO: Sleepy but wakes to voice.  Falls back asleep quickly.  Follows some commands.  No apparent focal neuro deficit. PSYCH: Sleepy and somewhat somnolent.  Procedures:  None  Microbiology summarized:   Assessment and plan: Principal Problem:   Volume overload Active Problems:   Acute on chronic diastolic (congestive) heart failure (HCC)   Essential hypertension   CAD (coronary artery disease)   Persistent atrial fibrillation (HCC)   Type 2 diabetes mellitus with hyperlipidemia (HCC)   Seizure (HCC)   Hyperlipidemia   COPD (chronic obstructive pulmonary disease) (HCC)   DNR (do not resuscitate)   Anemia of chronic disease   CKD (chronic kidney disease) stage 4, GFR 15-29 ml/min (HCC)   AML (acute myeloblastic leukemia) (HCC)   End of life care   Generalized weakness   Prolonged QT interval   Malignant neoplasm of left female breast (HCC)   Obesity (BMI 30-39.9)   SOB (shortness of breath)  End-of-life care/full comfort care/DNR-had discussion with patient's sister, [name] at bedside and patient's husband over the phone.  Both in agreement that patient is declining.  [name] is concerned that her body is "shutting off".  We have discussed treatment  options including continuing current aggressive intervention or shifting gears to full comfort care.  We have discussed about the details of full comfort care which is symptom management to alleviate pain, air hunger or anxiety so nature can take its course without distress.  [name] and Mr. Rottler believes, full comfort care is to the best of interest at this point.  Patient is not able to have meaningful participation in this conversation due to encephalopathy. -Full comfort care  with as needed meds -RN notified -Anticipate in hospital death  Anasarca, severe hypoalbuminemia, acute on chronic diastolic CHF: No significant improvement despite diuretics.  Renal function worse.  She is also hypotensive.  Declining physically and mentally.    CKD-4/azotemia concerning for uremia.  Not a candidate for dialysis.  Liver cirrhosis: CT in 04/2022 suggested liver cirrhosis.  Likely NASH.  TTE does not show right heart failure.   Acute metabolic encephalopathy: Could be due to uremia.   Acute on chronic anemia: Transfused 1 unit on admission.  Hgb dropping again.  No obvious bleeding.  History of CAD s/p stent: too high risk for CABG in 2018  Persistent Afib: Irregular rhythm but rate controlled.   PAD, recent left foot gangrene, critical limb ischemia: had balloon angioplasty 1/25 of ostial SFA lesion, proximal SFA lesion and underwent partial first ray amputation   H/o AML (remote) and recent breast cancer: followed by Dr. [name], last seen 7/24.  And clinical remission.  Uncontrolled IDDM-2 with hypoglycemia and hyperglycemia: A1c 9.9% in 08/2022.   Seizure d/o: Stable.  COPD/chronic hypoxic RF: Stable.   Prolonged QT:   Chronic pain/anxiety  Chronic thrombocytopenia  Obesity: Body mass index is 31.25 kg/m.           DVT prophylaxis:    Code Status: DNR/DNI Family Communication: Updated patient's sister, [name] at bedside and patient's husband over the phone Level of care: Telemetry Cardiac Status is: Inpatient Remains inpatient appropriate because: End-of-life care   Final disposition: SNF Consultants:  Cardiology  65 minutes with more than 50% spent in reviewing records, counseling patient/family and coordinating care.   Sch Meds:  Scheduled Meds:   Continuous Infusions:  chlorproMAZINE (THORAZINE) 12.5 mg in sodium chloride 0.9 % 25 mL IVPB     PRN Meds:.acetaminophen **OR** acetaminophen, antiseptic oral rinse, bisacodyl,  chlorproMAZINE (THORAZINE) 12.5 mg in sodium chloride 0.9 % 25 mL IVPB, haloperidol **OR** haloperidol **OR** haloperidol lactate, HYDROmorphone (DILAUDID) injection, LORazepam **OR** LORazepam **OR** LORazepam, ondansetron **OR** ondansetron (ZOFRAN) IV, polyvinyl alcohol  Antimicrobials: Anti-infectives (From admission, onward)    None        I have personally reviewed the following labs and images: CBC: Recent Labs  Lab 01/18/2023 1145 01/27/23 0330 01/28/23 0620 01/29/23 0304 01/30/23 0500  WBC 4.0 4.7 6.3 5.4 4.2  NEUTROABS 3.0  --   --   --   --   HGB 7.0* 6.9* 8.0* 7.8* 7.2*  HCT 22.2* 22.9* 24.3* 24.3* 23.8*  MCV 102.3* 104.1* 98.8 99.2 102.6*  PLT 141* 173 147* 141* 127*   BMP &GFR Recent Labs  Lab 01/29/2023 1145 01/09/2023 1935 01/27/23 0330 01/28/23 0620 01/29/23 0304 01/30/23 0500  NA 141  --  142 142  141 142 141  K 4.8  --  4.7 4.7  4.6 4.7 5.3*  CL 109  --  110 110  109 110 109  CO2 22  --  '24 23  23 24 24  '$ GLUCOSE 103*  --  159* 183*  183* 60* 162*  BUN 49*  --  49* 57*  59* 63* 75*  CREATININE 2.13*  --  2.23* 2.27*  2.25* 2.05* 2.23*  CALCIUM 7.1*  --  7.0* 7.3*  7.3* 7.3* 7.1*  MG  --  1.5*  --   --  1.9 1.7  PHOS  --   --  4.5 5.1* 4.8* 5.4*   Estimated Creatinine Clearance: 19.1 mL/min (A) (by C-G formula based on SCr of 2.23 mg/dL (H)). Liver & Pancreas: Recent Labs  Lab 01/10/2023 1145 01/27/23 0330 01/28/23 0620 01/29/23 0304 01/30/23 0500  AST 16  --  15  --   --   ALT 11  --  9  --   --   ALKPHOS 106  --  87  --   --   BILITOT 0.5  --  1.0  --   --   PROT 5.0*  --  5.5*  --   --   ALBUMIN 2.0* 2.1* 3.0*  3.0* 2.6* 2.3*   No results for input(s): "LIPASE", "AMYLASE" in the last 168 hours. No results for input(s): "AMMONIA" in the last 168 hours. Diabetic: Recent Labs    01/30/23 0930  HGBA1C 5.4   Recent Labs  Lab 01/29/23 1618 01/29/23 2156 01/30/23 0641 01/30/23 0926 01/30/23 1134  GLUCAP 196* 92 57* 165* 164*    Cardiac Enzymes: No results for input(s): "CKTOTAL", "CKMB", "CKMBINDEX", "TROPONINI" in the last 168 hours. No results for input(s): "PROBNP" in the last 8760 hours. Coagulation Profile: No results for input(s): "INR", "PROTIME" in the last 168 hours. Thyroid Function Tests: No results for input(s): "TSH", "T4TOTAL", "FREET4", "T3FREE", "THYROIDAB" in the last 72 hours. Lipid Profile: No results for input(s): "CHOL", "HDL", "LDLCALC", "TRIG", "CHOLHDL", "LDLDIRECT" in the last 72 hours. Anemia Panel: Recent Labs    01/27/23 1340 01/27/23 1341 01/30/23 0500 01/30/23 0930  VITAMINB12  --   --   --  466  FERRITIN 307  --  262  --   TIBC 136*  --  130*  --   IRON 124  --  23*  --   RETICCTPCT  --  1.7  --   --    Urine analysis:    Component Value Date/Time   COLORURINE YELLOW 12/07/2022 0019   APPEARANCEUR CLOUDY (A) 12/07/2022 0019   LABSPEC 1.013 12/07/2022 0019   LABSPEC 1.015 08/04/2014 1439   PHURINE 5.0 12/07/2022 0019   GLUCOSEU NEGATIVE 12/07/2022 0019   GLUCOSEU Negative 08/04/2014 1439   HGBUR SMALL (A) 12/07/2022 0019   HGBUR negative 10/10/2010 1607   BILIRUBINUR NEGATIVE 12/07/2022 0019   BILIRUBINUR Negative 08/04/2014 1439   KETONESUR NEGATIVE 12/07/2022 0019   PROTEINUR 30 (A) 12/07/2022 0019   UROBILINOGEN 0.2 08/04/2014 1439   NITRITE NEGATIVE 12/07/2022 0019   LEUKOCYTESUR LARGE (A) 12/07/2022 0019   LEUKOCYTESUR Negative 08/04/2014 1439   Sepsis Labs: Invalid input(s): "PROCALCITONIN", "LACTICIDVEN"  Microbiology: Recent Results (from the past 240 hour(s))  Resp panel by RT-PCR (RSV, Flu A&B, Covid) Anterior Nasal Swab     Status: None   Collection Time: 01/11/2023 11:11 AM   Specimen: Anterior Nasal Swab  Result Value Ref Range Status   SARS Coronavirus 2 by RT PCR NEGATIVE NEGATIVE Final   Influenza A by PCR NEGATIVE NEGATIVE Final   Influenza B by PCR NEGATIVE NEGATIVE Final    Comment: (NOTE) The Xpert Xpress SARS-CoV-2/FLU/RSV plus  assay is intended as an aid in the diagnosis of influenza from Nasopharyngeal swab specimens and should not be  used as a sole basis for treatment. Nasal washings and aspirates are unacceptable for Xpert Xpress SARS-CoV-2/FLU/RSV testing.  Fact Sheet for Patients: EntrepreneurPulse.com.au  Fact Sheet for Healthcare Providers: IncredibleEmployment.be  This test is not yet approved or cleared by the [place] FDA and has been authorized for detection and/or diagnosis of SARS-CoV-2 by FDA under an Emergency Use Authorization (EUA). This EUA will remain in effect (meaning this test can be used) for the duration of the COVID-19 declaration under Section 564(b)(1) of the Act, 21 U.S.C. section 360bbb-3(b)(1), unless the authorization is terminated or revoked.     Resp Syncytial Virus by PCR NEGATIVE NEGATIVE Final    Comment: (NOTE) Fact Sheet for Patients: EntrepreneurPulse.com.au  Fact Sheet for Healthcare Providers: IncredibleEmployment.be  This test is not yet approved or cleared by the [place] FDA and has been authorized for detection and/or diagnosis of SARS-CoV-2 by FDA under an Emergency Use Authorization (EUA). This EUA will remain in effect (meaning this test can be used) for the duration of the COVID-19 declaration under Section 564(b)(1) of the Act, 21 U.S.C. section 360bbb-3(b)(1), unless the authorization is terminated or revoked.  Performed at [facility], [place] [address] [place], [place] [address]     Radiology Studies: [place] ASCITES (ABDOMEN LIMITED)  Result Date: 01/29/2023 CLINICAL DATA:  Ascites EXAM: LIMITED ABDOMEN ULTRASOUND FOR ASCITES TECHNIQUE: Limited ultrasound survey for ascites was performed in all four abdominal quadrants. COMPARISON:  None Available. FINDINGS: Small pleural effusions. Small pockets of ascites are seen near the liver. No large areas. IMPRESSION:  Pleural effusions.  Mild ascites Electronically Signed   By: Jill Side M.D.   On: 01/29/2023 16:27      Zyshonne Malecha T. [place]  If 7PM-7AM, please contact night-coverage [email or web address] 01/30/2023, 12:17 PM

## 2023-01-30 NOTE — Progress Notes (Signed)
A.M. CBG 57. Patient A&O asymptomatic and can manage Oral intake. Orange Juice and Apple sauce given. Will endorse day RN of pending glucose check at 0715.

## 2023-01-30 NOTE — Progress Notes (Addendum)
Rounding Note    Patient Name: [name] Date of Encounter: 01/30/2023  [place] Cardiologist: Mertie Moores, MD   Subjective   Resting comfortably in bed. Weak tired  Inpatient Medications    Scheduled Meds:  apixaban  2.5 mg Oral BID   atorvastatin  40 mg Oral Daily   calcium acetate  1,334 mg Oral TID WC   cholecalciferol  1,000 Units Oral Daily   clopidogrel  75 mg Oral Daily   docusate sodium  100 mg Oral BID   feeding supplement  237 mL Oral BID BM   ferrous sulfate  325 mg Oral Q breakfast   folic acid  1 mg Oral Daily   furosemide  80 mg Intravenous TID   gabapentin  100 mg Oral TID   insulin aspart  0-9 Units Subcutaneous TID WC   insulin glargine-yfgn  10 Units Subcutaneous Daily   ipratropium-albuterol  3 mL Nebulization BID   letrozole  2.5 mg Oral Daily   levETIRAcetam  500 mg Oral BID   metoprolol succinate  12.5 mg Oral Daily   potassium chloride  10 mEq Oral BID   Continuous Infusions:  PRN Meds: acetaminophen **OR** acetaminophen, fluticasone furoate-vilanterol, ipratropium-albuterol, LORazepam, melatonin, nitroGLYCERIN, mouth rinse, oxyCODONE, prochlorperazine   Vital Signs    Vitals:   01/29/23 2033 01/30/23 0008 01/30/23 0403 01/30/23 0635  BP:  (!) 90/48  110/80  Pulse:  77  77  Resp:  20  20  Temp:  98.1 F (36.7 C)  98.1 F (36.7 C)  TempSrc:  Oral  Oral  SpO2: 98% 100%    Weight:  77.5 kg 77.5 kg   Height:        Intake/Output Summary (Last 24 hours) at 01/30/2023 0859 Last data filed at 01/29/2023 1842 Gross per 24 hour  Intake 717 ml  Output 200 ml  Net 517 ml      01/30/2023    4:03 AM 01/30/2023   12:08 AM 01/29/2023    4:08 AM  Last 3 Weights  Weight (lbs) 170 lb 13.7 oz 170 lb 13.7 oz 167 lb 8.8 oz  Weight (kg) 77.5 kg 77.5 kg 76 kg     Physical Exam   GEN: No acute distress.   Neck: No JVD Cardiac: RRR, no murmurs, rubs, or gallops.  Respiratory: Clear to auscultation bilaterally. GI: Soft,  nontender, non-distended  MS: Lower extremity edema mildly improved; No deformity. Neuro:  Nonfocal  Psych: Normal affect   Labs    High Sensitivity Troponin:   Recent Labs  Lab 02/03/2023 1935 01/18/2023 2205  TROPONINIHS 12 12     Chemistry Recent Labs  Lab 01/31/2023 1145 01/14/2023 1935 01/27/23 0330 01/28/23 0620 01/29/23 0304  NA 141  --  142 142  141 142  K 4.8  --  4.7 4.7  4.6 4.7  CL 109  --  110 110  109 110  CO2 22  --  '24 23  23 24  '$ GLUCOSE 103*  --  159* 183*  183* 60*  BUN 49*  --  49* 57*  59* 63*  CREATININE 2.13*  --  2.23* 2.27*  2.25* 2.05*  CALCIUM 7.1*  --  7.0* 7.3*  7.3* 7.3*  MG  --  1.5*  --   --  1.9  PROT 5.0*  --   --  5.5*  --   ALBUMIN 2.0*  --  2.1* 3.0*  3.0* 2.6*  AST 16  --   --  15  --   ALT 11  --   --  9  --   ALKPHOS 106  --   --  87  --   BILITOT 0.5  --   --  1.0  --   GFRNONAA 23*  --  22* 21*  21* 24*  ANIONGAP 10  --  '8 9  9 8    '$ Lipids No results for input(s): "CHOL", "TRIG", "HDL", "LABVLDL", "LDLCALC", "CHOLHDL" in the last 168 hours.  Hematology Recent Labs  Lab 01/27/23 0330 01/27/23 1341 01/28/23 0620 01/29/23 0304  WBC 4.7  --  6.3 5.4  RBC 2.20* 2.53* 2.46* 2.45*  HGB 6.9*  --  8.0* 7.8*  HCT 22.9*  --  24.3* 24.3*  MCV 104.1*  --  98.8 99.2  MCH 31.4  --  32.5 31.8  MCHC 30.1  --  32.9 32.1  RDW 17.4*  --  19.9* 18.8*  PLT 173  --  147* 141*   Thyroid No results for input(s): "TSH", "FREET4" in the last 168 hours.  BNP Recent Labs  Lab 01/08/2023 2205  BNP 677.3*    DDimer No results for input(s): "DDIMER" in the last 168 hours.   Radiology    [place] ASCITES (ABDOMEN LIMITED)  Result Date: 01/29/2023 CLINICAL DATA:  Ascites EXAM: LIMITED ABDOMEN ULTRASOUND FOR ASCITES TECHNIQUE: Limited ultrasound survey for ascites was performed in all four abdominal quadrants. COMPARISON:  None Available. FINDINGS: Small pleural effusions. Small pockets of ascites are seen near the liver. No large areas.  IMPRESSION: Pleural effusions.  Mild ascites Electronically Signed   By: Jill Side M.D.   On: 01/29/2023 16:27    Patient Profile     82 y.o. female with acute on chronic combined heart failure, anasarca, CAD on Plavix 75, persistent atrial fibrillation on Eliquis 2.5 twice daily, NSVT, PAD prior stenting January 2024 left femoral, hypertension, hyperlipidemia on atorvastatin 40, chronic kidney disease stage IV, cirrhosis, COPD, type 2 diabetes  Assessment & Plan   - Fairly meager urine output despite increasing the Lasix to 80 mg 3 times a day -Fairly significant anemia, appears pale hemoglobin 7.8 -Creatinine fairly stable currently 2.05 down from 2.3 -Albumin 2.6 this morning. -Weight continues to increase 166 up to 170. Will try metolazone '5mg'$  -DNR, spoke to Dr. Cyndia Skeeters. Palliative care.     For questions or updates, please contact [place] Please consult [email or web address] for contact info under        Signed, Candee Furbish, MD  01/30/2023, 8:59 AM  '

## 2023-01-30 NOTE — Care Management Important Message (Signed)
Important Message  Patient Details  Name: [name] MRN: [id number] Date of Birth: 13-Oct-1941   Medicare Important Message Given:  Yes     [name] 01/30/2023, 8:55 AM

## 2023-01-30 NOTE — Progress Notes (Signed)
Inpatient Diabetes Program Recommendations  AACE/ADA: New Consensus Statement on Inpatient Glycemic Control (2015)  Target Ranges:  Prepandial:   less than 140 mg/dL      Peak postprandial:   less than 180 mg/dL (1-2 hours)      Critically ill patients:  140 - 180 mg/dL   Lab Results  Component Value Date   GLUCAP 164 (H) 01/30/2023   HGBA1C 5.4 01/30/2023    Review of Glycemic Control  Latest Reference Range & Units 01/29/23 16:18 01/29/23 21:56 01/30/23 06:41 01/30/23 09:26 01/30/23 11:34  Glucose-Capillary 70 - 99 mg/dL 196 (H) 92 57 (L) 165 (H) 164 (H)   Diabetes history: DM 2 Outpatient Diabetes medications:  Novolog 2-10 units tid with meals Lantus 10 units bid Current orders for Inpatient glycemic control:  Semglee 10 units daily Novolog 0-9 units tid with meals   Inpatient Diabetes Program Recommendations:    Note A1C is 5.4%.  Likely needs reduction in home doses of insulins as well.  Agree with reduction in basal insulin today.    Thanks,  Adah Perl, RN, BC-ADM Inpatient Diabetes Coordinator Pager [phone]  (8a-5p)

## 2023-01-31 DIAGNOSIS — I5033 Acute on chronic diastolic (congestive) heart failure: Secondary | ICD-10-CM | POA: Diagnosis not present

## 2023-01-31 MED ORDER — HYDROMORPHONE HCL 1 MG/ML IJ SOLN
1.0000 mg | INTRAMUSCULAR | Status: DC | PRN
  Administered 2023-01-31 – 2023-02-01 (×9): 1 mg via INTRAVENOUS
  Filled 2023-01-31 (×9): qty 1

## 2023-01-31 MED ORDER — FUROSEMIDE 10 MG/ML IJ SOLN
60.0000 mg | Freq: Once | INTRAMUSCULAR | Status: AC
  Administered 2023-01-31: 60 mg via INTRAVENOUS
  Filled 2023-01-31: qty 6

## 2023-01-31 NOTE — Progress Notes (Signed)
EOS: Intermittent periods of alertness & orientation, conversational, following commands. Turns self in bed with minimal assist. Dilaudid PRN given overnight for dyspnea/generalized pain.

## 2023-01-31 NOTE — Progress Notes (Signed)
PROGRESS NOTE  [name] [id number] DOB: [birth year]   PCP: [name], Catherine Halsted, MD  Patient is from: SNF. [facility]  DOA: 01/24/2023 LOS: 4  Brief Narrative / Interim history: 82 year old F with PMH of diastolic CHF, paroxysmal A-fib on Eliquis, liver cirrhosis, COPD, CAD, DM-2, CKD-4, HTN, HLD, chronic back pain, uterine and breast cancer, AML (remote) and recent hospitalization earlier this month when she was treated for left foot gangrene with critical limb ischemia and had partial first ray amputation by Dr. Events on 1/21 followed by PCI of ostial SFA lesion and proximal SFA lesion on 1/25 by vascular surgery returning with shortness of breath and edema.  In the ER hypoxic, Hgb 7.0 (7.8 on 2/2),  Cr 2.1 (baseline), albumin 2, CXR with cardiomegaly, small pleural effusions, significant abdominal and upper thigh edema on exam.  Started on IV Lasix.  Cardiology consulted.  Patient was diuresed with IV Lasix, and also received IV albumin intermittently for hypoalbuminemia.  However, she remained fluid overloaded with worsening renal function, anemia and encephalopathy.  Eventually, she was transitioned to full comfort care on 01/30/2023 after discussion with patient's husband and sister, [name].   Subjective: Patient complains of feeling short of breath.  Denies any chest pain.  Her sister is at the bedside.  Objective: Vitals:   01/30/23 0403 01/30/23 0635 01/30/23 2020 01/31/23 0809  BP:  110/80 (!) 125/57 (!) 144/63  Pulse:  77 90 88  Resp:  '20 20 18  '$ Temp:  98.1 F (36.7 C) 97.9 F (36.6 C) 97.9 F (36.6 C)  TempSrc:  Oral Oral Oral  SpO2:   99% 98%  Weight: 77.5 kg     Height:        Examination:  Awake but lethargic.  In no distress Diminished air entry at the bases.  No definite crackles or wheezing or rhonchi. S1-S2 is normal regular. Abdomen is soft.  Nontender distended. Lower extremity edema is noted.   Assessment and plan:  End-of-life  care/full comfort care/DNR Previous rounding MD held discussions with patient's sister, [name] at bedside and patient's husband over the phone.  Both in agreement that patient is declining.  [name] is concerned that her body is "shutting off".  After further discussions patient will transition to comfort care.  Please review previous notes for details.   Complains of dyspnea this morning.  Will increase the dose of her hydromorphone.  Will give her a dose of furosemide.  Anasarca, severe hypoalbuminemia, acute on chronic diastolic CHF No significant improvement despite diuretics.  Renal function worse.     CKD-4/azotemia concerning for uremia Not a candidate for dialysis.  Liver cirrhosis CT in 04/2022 suggested liver cirrhosis.  Likely NASH.  TTE does not show right heart failure.   Acute metabolic encephalopathy Could be due to uremia.   Acute on chronic anemia Transfused 1 unit on admission.  Hgb dropping again.  No obvious bleeding.  History of CAD s/p stent:  High risk for CABG in 2018  Persistent Afib Irregular rhythm but rate controlled.   PAD, recent left foot gangrene, critical limb ischemia Had balloon angioplasty 1/25 of ostial SFA lesion, proximal SFA lesion and underwent partial first ray amputation   H/o AML (remote) and recent breast cancer Followed by Dr. Benay [name], last seen 7/24.   Uncontrolled IDDM-2 with hypoglycemia and hyperglycemia A1c 9.9% in 08/2022.   Seizure d/o Stable.  COPD/chronic hypoxic RF Stable.   Prolonged QT: Chronic pain/anxiety Chronic thrombocytopenia  Obesity: Body mass index  is 31.25 kg/m.  DVT prophylaxis: Now comfort care Code Status: DNR/DNI Family Communication: Updated patient's sister at bedside.   Disposition: To be determined.  If patient remains stable over the next 24 to 48 hours may need to pursue residential hospice.  Consultants:  Cardiology   Sch Meds:  Scheduled Meds:  furosemide  60 mg Intravenous Once     Continuous Infusions:  chlorproMAZINE (THORAZINE) 12.5 mg in sodium chloride 0.9 % 25 mL IVPB     PRN Meds:.acetaminophen **OR** acetaminophen, antiseptic oral rinse, bisacodyl, chlorproMAZINE (THORAZINE) 12.5 mg in sodium chloride 0.9 % 25 mL IVPB, haloperidol **OR** haloperidol **OR** haloperidol lactate, HYDROmorphone (DILAUDID) injection, LORazepam **OR** LORazepam **OR** LORazepam, ondansetron **OR** ondansetron (ZOFRAN) IV, polyvinyl alcohol  Antimicrobials: Anti-infectives (From admission, onward)    None        CBC: Recent Labs  Lab 01/12/2023 1145 01/27/23 0330 01/28/23 0620 01/29/23 0304 01/30/23 0500  WBC 4.0 4.7 6.3 5.4 4.2  NEUTROABS 3.0  --   --   --   --   HGB 7.0* 6.9* 8.0* 7.8* 7.2*  HCT 22.2* 22.9* 24.3* 24.3* 23.8*  MCV 102.3* 104.1* 98.8 99.2 102.6*  PLT 141* 173 147* 141* 127*    BMP &GFR Recent Labs  Lab 01/16/2023 1145 01/21/2023 1935 01/27/23 0330 01/28/23 0620 01/29/23 0304 01/30/23 0500  NA 141  --  142 142  141 142 141  K 4.8  --  4.7 4.7  4.6 4.7 5.3*  CL 109  --  110 110  109 110 109  CO2 22  --  '24 23  23 24 24  '$ GLUCOSE 103*  --  159* 183*  183* 60* 162*  BUN 49*  --  49* 57*  59* 63* 75*  CREATININE 2.13*  --  2.23* 2.27*  2.25* 2.05* 2.23*  CALCIUM 7.1*  --  7.0* 7.3*  7.3* 7.3* 7.1*  MG  --  1.5*  --   --  1.9 1.7  PHOS  --   --  4.5 5.1* 4.8* 5.4*    Estimated Creatinine Clearance: 19.1 mL/min (A) (by C-G formula based on SCr of 2.23 mg/dL (H)). Liver & Pancreas: Recent Labs  Lab 01/25/2023 1145 01/27/23 0330 01/28/23 0620 01/29/23 0304 01/30/23 0500  AST 16  --  15  --   --   ALT 11  --  9  --   --   ALKPHOS 106  --  87  --   --   BILITOT 0.5  --  1.0  --   --   PROT 5.0*  --  5.5*  --   --   ALBUMIN 2.0* 2.1* 3.0*  3.0* 2.6* 2.3*     Diabetic: Recent Labs    01/30/23 0930  HGBA1C 5.4    Recent Labs  Lab 01/29/23 1618 01/29/23 2156 01/30/23 0641 01/30/23 0926 01/30/23 1134  GLUCAP 196* 92 57*  165* 164*     Thyroid Function Tests: Recent Labs    01/30/23 0930  TSH 4.529*    Anemia Panel: Recent Labs    01/30/23 0500 01/30/23 0930  VITAMINB12  --  466  FOLATE  --  29.1  FERRITIN 262  --   TIBC 130*  --   IRON 23*  --   RETICCTPCT  --  1.4     Microbiology: Recent Results (from the past 240 hour(s))  Resp panel by RT-PCR (RSV, Flu A&B, Covid) Anterior Nasal Swab     Status: None   Collection  Time: 01/11/2023 11:11 AM   Specimen: Anterior Nasal Swab  Result Value Ref Range Status   SARS Coronavirus 2 by RT PCR NEGATIVE NEGATIVE Final   Influenza A by PCR NEGATIVE NEGATIVE Final   Influenza B by PCR NEGATIVE NEGATIVE Final    Comment: (NOTE) The Xpert Xpress SARS-CoV-2/FLU/RSV plus assay is intended as an aid in the diagnosis of influenza from Nasopharyngeal swab specimens and should not be used as a sole basis for treatment. Nasal washings and aspirates are unacceptable for Xpert Xpress SARS-CoV-2/FLU/RSV testing.  Fact Sheet for Patients: EntrepreneurPulse.com.au  Fact Sheet for Healthcare Providers: IncredibleEmployment.be  This test is not yet approved or cleared by the [place] FDA and has been authorized for detection and/or diagnosis of SARS-CoV-2 by FDA under an Emergency Use Authorization (EUA). This EUA will remain in effect (meaning this test can be used) for the duration of the COVID-19 declaration under Section 564(b)(1) of the Act, 21 U.S.C. section 360bbb-3(b)(1), unless the authorization is terminated or revoked.     Resp Syncytial Virus by PCR NEGATIVE NEGATIVE Final    Comment: (NOTE) Fact Sheet for Patients: EntrepreneurPulse.com.au  Fact Sheet for Healthcare Providers: IncredibleEmployment.be  This test is not yet approved or cleared by the [place] FDA and has been authorized for detection and/or diagnosis of SARS-CoV-2 by FDA under an Emergency  Use Authorization (EUA). This EUA will remain in effect (meaning this test can be used) for the duration of the COVID-19 declaration under Section 564(b)(1) of the Act, 21 U.S.C. section 360bbb-3(b)(1), unless the authorization is terminated or revoked.  Performed at [facility], [place] [address], [place], [place] [address]     Radiology Studies: No results found.  [place]  [facility]  If 7PM-7AM, please contact night-coverage [email or web address] 01/31/2023, 10:25 AM

## 2023-01-31 NOTE — Plan of Care (Signed)
  Problem: Activity: Goal: Capacity to carry out activities will improve Outcome: Progressing   Problem: Clinical Measurements: Goal: Ability to maintain clinical measurements within normal limits will improve Outcome: Progressing Goal: Respiratory complications will improve Outcome: Progressing   Problem: Activity: Goal: Risk for activity intolerance will decrease Outcome: Progressing   Problem: Nutrition: Goal: Adequate nutrition will be maintained Outcome: Progressing   Problem: Pain Managment: Goal: General experience of comfort will improve Outcome: Progressing   Problem: Skin Integrity: Goal: Risk for impaired skin integrity will decrease Outcome: Progressing   Problem: Respiratory: Goal: Verbalizations of increased ease of respirations will increase Outcome: Progressing   Problem: Pain Management: Goal: Satisfaction with pain management regimen will improve Outcome: Progressing

## 2023-02-01 DIAGNOSIS — E8770 Fluid overload, unspecified: Secondary | ICD-10-CM | POA: Diagnosis not present

## 2023-02-01 DIAGNOSIS — Z515 Encounter for palliative care: Secondary | ICD-10-CM | POA: Diagnosis not present

## 2023-02-01 MED ORDER — HYDROMORPHONE HCL-NACL 50-0.9 MG/50ML-% IV SOLN: 1.0000 mg/h | INTRAVENOUS | Status: DC

## 2023-02-01 MED ORDER — GLYCOPYRROLATE 0.2 MG/ML IJ SOLN
0.2000 mg | INTRAMUSCULAR | Status: DC | PRN
  Administered 2023-02-01: 0.2 mg via INTRAVENOUS
  Filled 2023-02-01: qty 1

## 2023-02-01 MED ORDER — SODIUM CHLORIDE 0.9 % IV SOLN
1.0000 mg/h | INTRAVENOUS | Status: DC
  Administered 2023-02-01: 1 mg/h via INTRAVENOUS
  Filled 2023-02-01: qty 5

## 2023-02-02 ENCOUNTER — Encounter: Payer: Medicare HMO | Admitting: Podiatry

## 2023-02-06 NOTE — Progress Notes (Signed)
   02/17/2023 1715  Spiritual Encounters  Type of Visit Initial  Care provided to: Pt and family  Conversation partners present during encounter Nurse  Referral source Nurse (RN/NT/LPN)  Reason for visit Patient death  OnCall Visit Yes   Chap received call from RN that Pt was dying and family would like chap to pray.  [name] was currently in the ED but pulled away and was at bedside within 10 minutes, but pt had passed.  [name] spoke with family and prayed over Pt. Chap spent time with family as a compassionate presence.

## 2023-02-06 NOTE — Death Summary Note (Signed)
DEATH SUMMARY   Patient Details  Name: [name] MRN: [id number] DOB: December 29, 1940 [id number] Catherine Beals, MD Admission/Discharge Information   Admit Date:  February 25, 2023  Date of Death: Date of Death: Mar 03, 2023  Time of Death: Time of Death: Mar 17, 1710  Length of Stay: 5   Principle Cause of death: Acute Diastolic CHF  Hospital Diagnoses:   Acute on chronic diastolic (congestive) heart failure (HCC)   Essential hypertension   CAD (coronary artery disease)   Persistent atrial fibrillation (HCC)   Type 2 diabetes mellitus with hyperlipidemia (HCC)   Seizure (HCC)   Hyperlipidemia   COPD (chronic obstructive pulmonary disease) (HCC)   Anemia of chronic disease   CKD (chronic kidney disease) stage 4, GFR 15-29 ml/min (HCC)   Acute metabolic encephalopathy   Brief Narrative: 82 year old F with PMH of diastolic CHF, paroxysmal A-fib on Eliquis, liver cirrhosis, COPD, CAD, DM-2, CKD-4, HTN, HLD, chronic back pain, uterine and breast cancer, AML (remote) and recent hospitalization earlier this month when she was treated for left foot gangrene with critical limb ischemia and had partial first ray amputation on 1/21 followed by PCI of ostial SFA lesion and proximal SFA lesion on 1/25 by vascular surgery returning with shortness of breath and edema.   In the ER hypoxic, Hgb 7.0 (7.8 on 2/2),  Cr 2.1 (baseline), albumin 2, CXR with cardiomegaly, small pleural effusions, significant abdominal and upper thigh edema on exam.  Started on IV Lasix.  Cardiology consulted.   Patient was diuresed with IV Lasix, and also received IV albumin intermittently for hypoalbuminemia.  However, she remained fluid overloaded with worsening renal function, anemia and encephalopathy.  Eventually, she was transitioned to full comfort care on 01/30/2023 after discussion with patient's husband and sister, [name].      Hospital Course:   End-of-life care/full comfort care/DNR Previous rounding MD held  discussions with patient's sister, [name] at bedside and patient's husband over the phone.  Both in agreement that patient is declining.  After further discussions patient was transitioned to comfort care.  PatIEnt subsequently expired on 03/03/2023 at 5:11PM.    Anasarca, severe hypoalbuminemia, acute on chronic diastolic CHF [id number] concerning for uremia Liver cirrhosis Acute metabolic encephalopathy Acute on chronic anemia History of CAD s/p stent:  Persistent Afib PAD, recent left foot gangrene, critical limb ischemia H/o AML (remote) and recent breast cancer Uncontrolled IDDM-2 with hypoglycemia and hyperglycemia Seizure d/o COPD/chronic hypoxic RF Prolonged QT Chronic pain/anxiety Chronic thrombocytopenia   Obesity: Body mass index is 31.25 kg/m.    The results of significant diagnostics from this hospitalization (including imaging, microbiology, ancillary and laboratory) are listed below for reference.   Significant Diagnostic Studies: [place] ASCITES (ABDOMEN LIMITED)  Result Date: 01/29/2023 CLINICAL DATA:  Ascites EXAM: LIMITED ABDOMEN ULTRASOUND FOR ASCITES TECHNIQUE: Limited ultrasound survey for ascites was performed in all four abdominal quadrants. COMPARISON:  None Available. FINDINGS: Small pleural effusions. Small pockets of ascites are seen near the liver. No large areas. IMPRESSION: Pleural effusions.  Mild ascites Electronically Signed   By: Jill Side M.D.   On: 01/29/2023 16:27   DG Chest 2 View  Result Date: 02-25-2023 CLINICAL DATA:  Shortness of breath EXAM: CHEST - 2 VIEW COMPARISON:  01/01/2023 x-ray FINDINGS: Underinflation. Once again there is an enlarged cardiopericardial silhouette with a calcified aorta and right upper chest port. Increasing small right effusion right-greater-than-left and vascular congestion. No pneumothorax. Overlapping cardiac leads. IMPRESSION: Worsening inflation with increasing small effusions and opacities. Recommend follow-up  Electronically Signed   By: Jill Side M.D.   On: 01/25/2023 13:23   PERIPHERAL VASCULAR CATHETERIZATION  Result Date: 01/05/2023 DATE OF SERVICE: 01/05/2023  PATIENT:  [name]  82 y.o. female  PRE-OPERATIVE DIAGNOSIS:  Atherosclerosis of native arteries of left lower extremity causing gangrene  POST-OPERATIVE DIAGNOSIS:  Same  PROCEDURE:  1) Ultrasound guided right access 2) Aortogram 3) Left lower extremity angiogram with third order cannulation (~29m contrast) 4) Left femoropopliteal angioplasty and stenting (6x1522m [id number]) 5) Conscious sedation (39 minutes)   SURGEON:  ThomYevonne AlinewkStanford Breed  ASSISTANT: none  ANESTHESIA:   local and IV sedation  ESTIMATED BLOOD LOSS: minimal  LOCAL MEDICATIONS USED:  LIDOCAINE  COUNTS: confirmed correct.  PATIENT DISPOSITION:  PACU - hemodynamically stable.  Delay start of Pharmacological VTE agent (>24hrs) due to surgical blood loss or risk of bleeding: no  INDICATION FOR PROCEDURE: Catherine LANDENa 82 y43. female with left great toe gangrene. After careful discussion of risks, benefits, and alternatives the patient was offered angiography with intervention. The patient understood and wished to proceed.  OPERATIVE FINDINGS: Terminal aorta and iliac arteries: Widely patent  Left lower extremity: Common femoral artery: Diffuse disease. widely patent Profunda femoris artery: Diffuse disease. widely patent Superficial femoral artery: Diffuse disease. Restenosis of proximal, mid and distal SFA ~70% greatest area of stenosis. Popliteal artery: Diffuse disease. Above knee popliteal with focal stenosis ~70%. No significant stenosis behind or below the knee. Anterior tibial artery: patent at its origin, tapers to occlusion in calf Tibioperoneal trunk: patent Peroneal artery: patent at its origin, tapers in mid calf Posterior tibial artery: dominant tibial, courses to plantar arch filling the foot Pedal circulation: fills via PT  GLASS score. FP 4. IP 0.  Stage III.  WIfI score. 2 / 3 / 1. Stage IV.  DESCRIPTION OF PROCEDURE: After identification of the patient in the pre-operative holding area, the patient was transferred to the operating room. The patient was positioned supine on the operating room table.  Anesthesia was induced. The groins was prepped and draped in standard fashion. A surgical pause was performed confirming correct patient, procedure, and operative location.  The right groin was anesthetized with subcutaneous injection of 1% lidocaine. Using ultrasound guidance, the right common femoral artery was accessed with micropuncture technique. Fluoroscopy was used to confirm cannulation over the femoral head. The 12F sheath was upsized to 41F.  A Benson wire was advanced into the distal aorta. Over the wire an omni flush catheter was advanced to the level of L2. Aortogram was performed - see above for details.  The left common iliac artery was selected with an omniflush catheter and glidewire advantage guidewire. The wire was advanced into the common femoral artery. Over the wire the omni flush catheter was advanced into the external iliac artery. Selective angiography was performed - see above for details.  The decision was made to intervene. The patient was heparinized with 8,000 units of heparin. The 41F sheath was exchanged for a 23F x 45cm sheath. Selective angiography of the left lower extremity was performed prior to intervention.  The lesions were treated with: Femoropopliteal angioplasty and stenting (6x150mm8m150mm,65m0mm E89ma)  Completion angiography revealed: Resolution of SFA/Pop disease  The sheath was left in place to be removed in the recovery area.  Conscious sedation was administered with the use of IV fentanyl and midazolam under continuous physician and nurse monitoring.  Heart rate, blood pressure, and oxygen saturation were continuously  monitored.  Total sedation time was 39 minutes  Upon completion of the case instrument and sharps  counts were confirmed correct. The patient was transferred to the  PACU in good condition. I was present for all portions of the procedure.  PLAN: Plavix '75mg'$  PO QD. Eliquis 2.'5mg'$  PO BID. High intensity statin therapy. Optimized from vascular standpoint. Follow up in 4 weeks with ABI and Duplex.  Yevonne Aline. Stanford Breed, MD [facility] Phone Number: [phone] 01/05/2023 10:56 AM    DG HIP UNILAT W OR W/O PELVIS MIN 4 VIEWS LEFT  Result Date: 01/04/2023 CLINICAL DATA:  Left hip pain.  Leg swelling. EXAM: DG HIP (WITH OR WITHOUT PELVIS) LEFT three views COMPARISON:  Left hip radiographs 12/10/2022 FINDINGS: Moderate osteopenia again noted. Vascular calcifications are present. Left hip is located. No acute or healing fractures are present. Right hip ORIF stable. IMPRESSION: 1. No acute or healing fracture. 2. Moderate osteopenia. 3. Vascular calcifications. Electronically Signed   By: San Morelle M.D.   On: 01/04/2023 11:26   DG Foot Complete Left  Result Date: 01/03/2023 CLINICAL DATA:  Postoperative. EXAM: LEFT FOOT - COMPLETE 3+ VIEW COMPARISON:  Preoperative radiograph 12/31/2022 FINDINGS: Transmetatarsal amputation of the first ray with resection of the first toe. Expected postsurgical change in the soft tissues. No evidence of acute fracture. Plantar calcaneal spur and Achilles tendon enthesophyte. IMPRESSION: Transmetatarsal amputation of the first ray with resection of the first toe. No immediate postoperative complication. Electronically Signed   By: Keith Rake M.D.   On: 01/03/2023 18:39    Microbiology: Recent Results (from the past 240 hour(s))  Resp panel by RT-PCR (RSV, Flu A&B, Covid) Anterior Nasal Swab     Status: None   Collection Time: 01/18/2023 11:11 AM   Specimen: Anterior Nasal Swab  Result Value Ref Range Status   SARS Coronavirus 2 by RT PCR NEGATIVE NEGATIVE Final   Influenza A by PCR NEGATIVE NEGATIVE Final   Influenza B by  PCR NEGATIVE NEGATIVE Final    Comment: (NOTE) The Xpert Xpress SARS-CoV-2/FLU/RSV plus assay is intended as an aid in the diagnosis of influenza from Nasopharyngeal swab specimens and should not be used as a sole basis for treatment. Nasal washings and aspirates are unacceptable for Xpert Xpress SARS-CoV-2/FLU/RSV testing.  Fact Sheet for Patients: EntrepreneurPulse.com.au  Fact Sheet for Healthcare Providers: IncredibleEmployment.be  This test is not yet approved or cleared by the [place] FDA and has been authorized for detection and/or diagnosis of SARS-CoV-2 by FDA under an Emergency Use Authorization (EUA). This EUA will remain in effect (meaning this test can be used) for the duration of the COVID-19 declaration under Section 564(b)(1) of the Act, 21 U.S.C. section 360bbb-3(b)(1), unless the authorization is terminated or revoked.     Resp Syncytial Virus by PCR NEGATIVE NEGATIVE Final    Comment: (NOTE) Fact Sheet for Patients: EntrepreneurPulse.com.au  Fact Sheet for Healthcare Providers: IncredibleEmployment.be  This test is not yet approved or cleared by the [place] FDA and has been authorized for detection and/or diagnosis of SARS-CoV-2 by FDA under an Emergency Use Authorization (EUA). This EUA will remain in effect (meaning this test can be used) for the duration of the COVID-19 declaration under Section 564(b)(1) of the Act, 21 U.S.C. section 360bbb-3(b)(1), unless the authorization is terminated or revoked.  Performed at [facility], [place] [address] [place], [place] [address]       Signed: Bonnielee Haff, MD

## 2023-02-06 NOTE — Progress Notes (Signed)
PROGRESS NOTE  [name] [id number] DOB: [birth year]   PCP: Isaac Bliss, Rayford Halsted, MD  Patient is from: SN[facility]  DOA: 01/17/2023 LOS: 5  Brief Narrative / Interim history: 82 year old F with PMH of diastolic CHF, paroxysmal A-fib on Eliquis, liver cirrhosis, COPD, CAD, DM-2, CKD-4, HTN, HLD, chronic back pain, uterine and breast cancer, AML (remote) and recent hospitalization earlier this month when she was treated for left foot gangrene with critical limb ischemia and had partial first ray amputation by Dr. Events on 1/21 followed by PCI of ostial SFA lesion and proximal SFA lesion on 1/25 by vascular surgery returning with shortness of breath and edema.  In the ER hypoxic, Hgb 7.0 (7.8 on 2/2),  Cr 2.1 (baseline), albumin 2, CXR with cardiomegaly, small pleural effusions, significant abdominal and upper thigh edema on exam.  Started on IV Lasix.  Cardiology consulted.  Patient was diuresed with IV Lasix, and also received IV albumin intermittently for hypoalbuminemia.  However, she remained fluid overloaded with worsening renal function, anemia and encephalopathy.  Eventually, she was transitioned to full comfort care on 01/30/2023 after discussion with patient's husband and sister, [name].   Subjective: Patient noted to be very minimally responsive this morning.  Sister is at the bedside.    Objective: Vitals:   01/30/23 2020 01/31/23 0809 01/31/23 1944 02/27/23 0848  BP: (!) 125/57 (!) 144/63 (!) 135/51 (!) 96/47  Pulse: 90 88 88 98  Resp: '20 18 18 '$ (!) 22  Temp: 97.9 F (36.6 C) 97.9 F (36.6 C) 98.7 F (37.1 C) 98.8 F (37.1 C)  TempSrc: Oral Oral Oral Axillary  SpO2: 99% 98% 95% (!) 83%  Weight:      Height:        Examination:  Minimally responsive Noted to be tachypneic.  Crackles bilateral bases. S1-S2 is normal regular.   Assessment and plan:  End-of-life care/full comfort care/DNR Previous rounding MD held discussions with patient's sister,  [name] at bedside and patient's husband over the phone.  Both in agreement that patient is declining.  [name] is concerned that her body is "shutting off".  After further discussions patient was transitioned to comfort care.  Please review previous notes for details.   Noted to have shallow respirations.  Will initiate Dilaudid infusion.  Robinul as needed.  Discussed with patient's sister at bedside.  Anasarca, severe hypoalbuminemia, acute on chronic diastolic CHF No significant improvement despite diuretics.  Renal function worse.     CKD-4/azotemia concerning for uremia Not a candidate for dialysis.  Liver cirrhosis CT in 04/2022 suggested liver cirrhosis.  Likely NASH.  TTE does not show right heart failure.   Acute metabolic encephalopathy Could be due to uremia.   Acute on chronic anemia Transfused 1 unit on admission.  Hgb dropping again.  No obvious bleeding.  History of CAD s/p stent:  High risk for CABG in 2018  Persistent Afib Irregular rhythm but rate controlled.   PAD, recent left foot gangrene, critical limb ischemia Had balloon angioplasty 1/25 of ostial SFA lesion, proximal SFA lesion and underwent partial first ray amputation   H/o AML (remote) and recent breast cancer Followed by Dr. [name], last seen 7/24.   Uncontrolled IDDM-2 with hypoglycemia and hyperglycemia A1c 9.9% in 08/2022.   Seizure d/o Stable.  COPD/chronic hypoxic RF Stable.   Prolonged QT: Chronic pain/anxiety Chronic thrombocytopenia  Obesity: Body mass index is 31.25 kg/m.  DVT prophylaxis: Now comfort care Code Status: DNR/DNI Family Communication: Updated patient's sister at  bedside.   Disposition: To be determined.  If patient remains stable over the next 24 to 48 hours may need to pursue residential hospice.  Consultants:  Cardiology   Sch Meds:  Scheduled Meds:    Continuous Infusions:  chlorproMAZINE (THORAZINE) 12.5 mg in sodium chloride 0.9 % 25 mL IVPB      HYDROmorphone     PRN Meds:.acetaminophen **OR** acetaminophen, antiseptic oral rinse, bisacodyl, chlorproMAZINE (THORAZINE) 12.5 mg in sodium chloride 0.9 % 25 mL IVPB, glycopyrrolate, haloperidol **OR** haloperidol **OR** haloperidol lactate, HYDROmorphone (DILAUDID) injection, LORazepam **OR** LORazepam **OR** LORazepam, ondansetron **OR** ondansetron (ZOFRAN) IV, polyvinyl alcohol  Antimicrobials: Anti-infectives (From admission, onward)    None        CBC: Recent Labs  Lab 01/08/2023 1145 01/27/23 0330 01/28/23 0620 01/29/23 0304 01/30/23 0500  WBC 4.0 4.7 6.3 5.4 4.2  NEUTROABS 3.0  --   --   --   --   HGB 7.0* 6.9* 8.0* 7.8* 7.2*  HCT 22.2* 22.9* 24.3* 24.3* 23.8*  MCV 102.3* 104.1* 98.8 99.2 102.6*  PLT 141* 173 147* 141* 127*    BMP &GFR Recent Labs  Lab 01/27/2023 1145 01/11/2023 1935 01/27/23 0330 01/28/23 0620 01/29/23 0304 01/30/23 0500  NA 141  --  142 142  141 142 141  K 4.8  --  4.7 4.7  4.6 4.7 5.3*  CL 109  --  110 110  109 110 109  CO2 22  --  '24 23  23 24 24  '$ GLUCOSE 103*  --  159* 183*  183* 60* 162*  BUN 49*  --  49* 57*  59* 63* 75*  CREATININE 2.13*  --  2.23* 2.27*  2.25* 2.05* 2.23*  CALCIUM 7.1*  --  7.0* 7.3*  7.3* 7.3* 7.1*  MG  --  1.5*  --   --  1.9 1.7  PHOS  --   --  4.5 5.1* 4.8* 5.4*    Estimated Creatinine Clearance: 19.1 mL/min (A) (by C-G formula based on SCr of 2.23 mg/dL (H)). Liver & Pancreas: Recent Labs  Lab 01/10/2023 1145 01/27/23 0330 01/28/23 0620 01/29/23 0304 01/30/23 0500  AST 16  --  15  --   --   ALT 11  --  9  --   --   ALKPHOS 106  --  87  --   --   BILITOT 0.5  --  1.0  --   --   PROT 5.0*  --  5.5*  --   --   ALBUMIN 2.0* 2.1* 3.0*  3.0* 2.6* 2.3*     Diabetic: Recent Labs    01/30/23 0930  HGBA1C 5.4    Recent Labs  Lab 01/29/23 1618 01/29/23 2156 01/30/23 0641 01/30/23 0926 01/30/23 1134  GLUCAP 196* 92 57* 165* 164*     Thyroid Function Tests: Recent Labs     01/30/23 0930  TSH 4.529*     Anemia Panel: Recent Labs    01/30/23 0500 01/30/23 0930  VITAMINB12  --  466  FOLATE  --  29.1  FERRITIN 262  --   TIBC 130*  --   IRON 23*  --   RETICCTPCT  --  1.4     Microbiology: Recent Results (from the past 240 hour(s))  Resp panel by RT-PCR (RSV, Flu A&B, Covid) Anterior Nasal Swab     Status: None   Collection Time: 02/04/2023 11:11 AM   Specimen: Anterior Nasal Swab  Result Value Ref Range Status  SARS Coronavirus 2 by RT PCR NEGATIVE NEGATIVE Final   Influenza A by PCR NEGATIVE NEGATIVE Final   Influenza B by PCR NEGATIVE NEGATIVE Final    Comment: (NOTE) The Xpert Xpress SARS-CoV-2/FLU/RSV plus assay is intended as an aid in the diagnosis of influenza from Nasopharyngeal swab specimens and should not be used as a sole basis for treatment. Nasal washings and aspirates are unacceptable for Xpert Xpress SARS-CoV-2/FLU/RSV testing.  Fact Sheet for Patients: EntrepreneurPulse.com.au  Fact Sheet for Healthcare Providers: IncredibleEmployment.be  This test is not yet approved or cleared by the [place] FDA and has been authorized for detection and/or diagnosis of SARS-CoV-2 by FDA under an Emergency Use Authorization (EUA). This EUA will remain in effect (meaning this test can be used) for the duration of the COVID-19 declaration under Section 564(b)(1) of the Act, 21 U.S.C. section 360bbb-3(b)(1), unless the authorization is terminated or revoked.     Resp Syncytial Virus by PCR NEGATIVE NEGATIVE Final    Comment: (NOTE) Fact Sheet for Patients: EntrepreneurPulse.com.au  Fact Sheet for Healthcare Providers: IncredibleEmployment.be  This test is not yet approved or cleared by the [place] FDA and has been authorized for detection and/or diagnosis of SARS-CoV-2 by FDA under an Emergency Use Authorization (EUA). This EUA will remain in effect  (meaning this test can be used) for the duration of the COVID-19 declaration under Section 564(b)(1) of the Act, 21 U.S.C. section 360bbb-3(b)(1), unless the authorization is terminated or revoked.  Performed at [facility], [place] [address], [place], [place] [address]     Radiology Studies: No results found.  [place]  [facility]  If 7PM-7AM, please contact night-coverage [email or web address] 02-07-2023, 9:03 AM

## 2023-02-06 NOTE — Death Summary Note (Incomplete Revision)
DEATH SUMMARY   Patient Details  Name: [name] MRN: [id number] DOB: [birth year] [id number] Catherine Beals, MD Admission/Discharge Information   Admit Date:  01-27-23  Date of Death: Date of Death: 02/02/23  Time of Death: Time of Death: 02/16/1710  Length of Stay: 5   Principle Cause of death: Acute Diastolic CHF  Hospital Diagnoses:   Acute on chronic diastolic (congestive) heart failure (HCC)   Essential hypertension   CAD (coronary artery disease)   Persistent atrial fibrillation (HCC)   Type 2 diabetes mellitus with hyperlipidemia (HCC)   Seizure (HCC)   Hyperlipidemia   COPD (chronic obstructive pulmonary disease) (HCC)   Anemia of chronic disease   CKD (chronic kidney disease) stage 4, GFR 15-29 ml/min (HCC)   Acute metabolic encephalopathy   Brief Narrative: 82 year old F with PMH of diastolic CHF, paroxysmal A-fib on Eliquis, liver cirrhosis, COPD, CAD, DM-2, CKD-4, HTN, HLD, chronic back pain, uterine and breast cancer, AML (remote) and recent hospitalization earlier this month when she was treated for left foot gangrene with critical limb ischemia and had partial first ray amputation on 1/21 followed by PCI of ostial SFA lesion and proximal SFA lesion on 1/25 by vascular surgery returning with shortness of breath and edema.   In the ER hypoxic, Hgb 7.0 (7.8 on 2/2),  Cr 2.1 (baseline), albumin 2, CXR with cardiomegaly, small pleural effusions, significant abdominal and upper thigh edema on exam.  Started on IV Lasix.  Cardiology consulted.   Patient was diuresed with IV Lasix, and also received IV albumin intermittently for hypoalbuminemia.  However, she remained fluid overloaded with worsening renal function, anemia and encephalopathy.  Eventually, she was transitioned to full comfort care on 01/30/2023 after discussion with patient's husband and sister, [name].      Hospital Course:   End-of-life care/full comfort care/DNR Previous rounding MD held  discussions with patient's sister, [name] at bedside and patient's husband over the phone.  Both in agreement that patient is declining.  After further discussions patient was transitioned to comfort care.  PatIEnt subsequently expired on February 02, 2023 at 5:11PM.    Anasarca, severe hypoalbuminemia, acute on chronic diastolic CHF   [id number] concerning for uremia Liver cirrhosis Acute metabolic encephalopathy Acute on chronic anemia History of CAD s/p stent:  Persistent Afib PAD, recent left foot gangrene, critical limb ischemia H/o AML (remote) and recent breast cancer Uncontrolled IDDM-2 with hypoglycemia and hyperglycemia Seizure d/o COPD/chronic hypoxic RF   Prolonged QT Chronic pain/anxiety Chronic thrombocytopenia   Obesity: Body mass index is 31.25 kg/m.    The results of significant diagnostics from this hospitalization (including imaging, microbiology, ancillary and laboratory) are listed below for reference.   Significant Diagnostic Studies: [place] ASCITES (ABDOMEN LIMITED)  Result Date: 01/29/2023 CLINICAL DATA:  Ascites EXAM: LIMITED ABDOMEN ULTRASOUND FOR ASCITES TECHNIQUE: Limited ultrasound survey for ascites was performed in all four abdominal quadrants. COMPARISON:  None Available. FINDINGS: Small pleural effusions. Small pockets of ascites are seen near the liver. No large areas. IMPRESSION: Pleural effusions.  Mild ascites Electronically Signed   By: Jill Side M.D.   On: 01/29/2023 16:27   DG Chest 2 View  Result Date: 01/27/23 CLINICAL DATA:  Shortness of breath EXAM: CHEST - 2 VIEW COMPARISON:  01/01/2023 x-ray FINDINGS: Underinflation. Once again there is an enlarged cardiopericardial silhouette with a calcified aorta and right upper chest port. Increasing small right effusion right-greater-than-left and vascular congestion. No pneumothorax. Overlapping cardiac leads. IMPRESSION: Worsening inflation with increasing small effusions and  opacities. Recommend  follow-up Electronically Signed   By: Jill Side M.D.   On: 01/10/2023 13:23   PERIPHERAL VASCULAR CATHETERIZATION  Result Date: 01/05/2023 DATE OF SERVICE: 01/05/2023  PATIENT:  [name]  82 y.o. female  PRE-OPERATIVE DIAGNOSIS:  Atherosclerosis of native arteries of left lower extremity causing gangrene  POST-OPERATIVE DIAGNOSIS:  Same  PROCEDURE:  1) Ultrasound guided right access 2) Aortogram 3) Left lower extremity angiogram with third order cannulation (~69m contrast) 4) Left femoropopliteal angioplasty and stenting (6x153m [id number]) 5) Conscious sedation (39 minutes)   SURGEON:  ThomYevonne AlinewkStanford Breed  ASSISTANT: none  ANESTHESIA:   local and IV sedation  ESTIMATED BLOOD LOSS: minimal  LOCAL MEDICATIONS USED:  LIDOCAINE  COUNTS: confirmed correct.  PATIENT DISPOSITION:  PACU - hemodynamically stable.  Delay start of Pharmacological VTE agent (>24hrs) due to surgical blood loss or risk of bleeding: no  INDICATION FOR PROCEDURE: [name] 82 y46. female with left great toe gangrene. After careful discussion of risks, benefits, and alternatives the patient was offered angiography with intervention. The patient understood and wished to proceed.  OPERATIVE FINDINGS: Terminal aorta and iliac arteries: Widely patent  Left lower extremity: Common femoral artery: Diffuse disease. widely patent Profunda femoris artery: Diffuse disease. widely patent Superficial femoral artery: Diffuse disease. Restenosis of proximal, mid and distal SFA ~70% greatest area of stenosis. Popliteal artery: Diffuse disease. Above knee popliteal with focal stenosis ~70%. No significant stenosis behind or below the knee. Anterior tibial artery: patent at its origin, tapers to occlusion in calf Tibioperoneal trunk: patent Peroneal artery: patent at its origin, tapers in mid calf Posterior tibial artery: dominant tibial, courses to plantar arch filling the foot Pedal circulation: fills via PT  GLASS score. FP 4.  IP 0. Stage III.  WIfI score. 2 / 3 / 1. Stage IV.  DESCRIPTION OF PROCEDURE: After identification of the patient in the pre-operative holding area, the patient was transferred to the operating room. The patient was positioned supine on the operating room table.  Anesthesia was induced. The groins was prepped and draped in standard fashion. A surgical pause was performed confirming correct patient, procedure, and operative location.  The right groin was anesthetized with subcutaneous injection of 1% lidocaine. Using ultrasound guidance, the right common femoral artery was accessed with micropuncture technique. Fluoroscopy was used to confirm cannulation over the femoral head. The 32F sheath was upsized to 68F.  A Benson wire was advanced into the distal aorta. Over the wire an omni flush catheter was advanced to the level of L2. Aortogram was performed - see above for details.  The left common iliac artery was selected with an omniflush catheter and glidewire advantage guidewire. The wire was advanced into the common femoral artery. Over the wire the omni flush catheter was advanced into the external iliac artery. Selective angiography was performed - see above for details.  The decision was made to intervene. The patient was heparinized with 8,000 units of heparin. The 68F sheath was exchanged for a 30F x 45cm sheath. Selective angiography of the left lower extremity was performed prior to intervention.  The lesions were treated with: Femoropopliteal angioplasty and stenting (6x150mm2m150mm,64m0mm E47ma)  Completion angiography revealed: Resolution of SFA/Pop disease  The sheath was left in place to be removed in the recovery area.  Conscious sedation was administered with the use of IV fentanyl and midazolam under continuous physician and nurse monitoring.  Heart rate, blood pressure, and  oxygen saturation were continuously monitored.  Total sedation time was 39 minutes  Upon completion of the case instrument and  sharps counts were confirmed correct. The patient was transferred to the  PACU in good condition. I was present for all portions of the procedure.  PLAN: Plavix '75mg'$  PO QD. Eliquis 2.'5mg'$  PO BID. High intensity statin therapy. Optimized from vascular standpoint. Follow up in 4 weeks with ABI and Duplex.  Yevonne Aline. Stanford Breed, MD [facility] Phone Number: [phone] 01/05/2023 10:56 AM    DG HIP UNILAT W OR W/O PELVIS MIN 4 VIEWS LEFT  Result Date: 01/04/2023 CLINICAL DATA:  Left hip pain.  Leg swelling. EXAM: DG HIP (WITH OR WITHOUT PELVIS) LEFT three views COMPARISON:  Left hip radiographs 12/10/2022 FINDINGS: Moderate osteopenia again noted. Vascular calcifications are present. Left hip is located. No acute or healing fractures are present. Right hip ORIF stable. IMPRESSION: 1. No acute or healing fracture. 2. Moderate osteopenia. 3. Vascular calcifications. Electronically Signed   By: San Morelle M.D.   On: 01/04/2023 11:26   DG Foot Complete Left  Result Date: 01/03/2023 CLINICAL DATA:  Postoperative. EXAM: LEFT FOOT - COMPLETE 3+ VIEW COMPARISON:  Preoperative radiograph 12/31/2022 FINDINGS: Transmetatarsal amputation of the first ray with resection of the first toe. Expected postsurgical change in the soft tissues. No evidence of acute fracture. Plantar calcaneal spur and Achilles tendon enthesophyte. IMPRESSION: Transmetatarsal amputation of the first ray with resection of the first toe. No immediate postoperative complication. Electronically Signed   By: Keith Rake M.D.   On: 01/03/2023 18:39    Microbiology: Recent Results (from the past 240 hour(s))  Resp panel by RT-PCR (RSV, Flu A&B, Covid) Anterior Nasal Swab     Status: None   Collection Time: 01/29/2023 11:11 AM   Specimen: Anterior Nasal Swab  Result Value Ref Range Status   SARS Coronavirus 2 by RT PCR NEGATIVE NEGATIVE Final   Influenza A by PCR NEGATIVE NEGATIVE Final   Influenza  B by PCR NEGATIVE NEGATIVE Final    Comment: (NOTE) The Xpert Xpress SARS-CoV-2/FLU/RSV plus assay is intended as an aid in the diagnosis of influenza from Nasopharyngeal swab specimens and should not be used as a sole basis for treatment. Nasal washings and aspirates are unacceptable for Xpert Xpress SARS-CoV-2/FLU/RSV testing.  Fact Sheet for Patients: EntrepreneurPulse.com.au  Fact Sheet for Healthcare Providers: IncredibleEmployment.be  This test is not yet approved or cleared by the [place] FDA and has been authorized for detection and/or diagnosis of SARS-CoV-2 by FDA under an Emergency Use Authorization (EUA). This EUA will remain in effect (meaning this test can be used) for the duration of the COVID-19 declaration under Section 564(b)(1) of the Act, 21 U.S.C. section 360bbb-3(b)(1), unless the authorization is terminated or revoked.     Resp Syncytial Virus by PCR NEGATIVE NEGATIVE Final    Comment: (NOTE) Fact Sheet for Patients: EntrepreneurPulse.com.au  Fact Sheet for Healthcare Providers: IncredibleEmployment.be  This test is not yet approved or cleared by the [place] FDA and has been authorized for detection and/or diagnosis of SARS-CoV-2 by FDA under an Emergency Use Authorization (EUA). This EUA will remain in effect (meaning this test can be used) for the duration of the COVID-19 declaration under Section 564(b)(1) of the Act, 21 U.S.C. section 360bbb-3(b)(1), unless the authorization is terminated or revoked.  Performed at [facility], [place] [address] [place], [place] [address]       Signed: Nicki Guadal[place]ara  [place] Pink, MD

## 2023-02-06 NOTE — Progress Notes (Signed)
Patient's sister at bedside. Patient apneic. No breath sounds nor rise and fall of chest. Absent pulse and absent heart tones after listening for 1 full minute by this RN and Beckey Rutter, RN. Time of death 73. RN notified Dr. [place] Pink of death. Chaplin came to bedside and praying with sister and two other family members. Support given to family. Family calling patient's husband to make him aware that patient passed away.

## 2023-02-06 NOTE — Progress Notes (Signed)
30 mL of dilaudid wasted with Beckey Rutter, RN charge nurse.

## 2023-02-06 NOTE — Progress Notes (Signed)
Post mortem care completed and while in the process of moving patient to gurney for transport to morgue, patient's daughter walked in room. Daughter calmly asked if she could spend some time with her mother. Daughter at bedside at this time.

## 2023-02-06 DEATH — deceased

## 2023-02-10 ENCOUNTER — Encounter (HOSPITAL_COMMUNITY): Payer: Medicare HMO

## 2023-02-13 ENCOUNTER — Ambulatory Visit: Payer: Medicare HMO | Admitting: Internal Medicine

## 2023-03-02 ENCOUNTER — Other Ambulatory Visit: Payer: Medicare HMO

## 2023-03-02 ENCOUNTER — Ambulatory Visit: Payer: Medicare HMO | Admitting: Oncology

## 2023-11-23 IMAGING — MR MR HEAD W/O CM
12 of 13 series · 44 of 48 positions shown · non-contrast
Comparison: Head CT from yesterday

CLINICAL DATA: Neuro deficit with acute stroke suspected. Transient
slurred speech. Possible seizure like symptoms.

EXAM:
MRI HEAD WITHOUT CONTRAST
TECHNIQUE: Multiplanar, multiecho pulse sequences of the brain and surrounding
structures were obtained without intravenous contrast.

[Series 5: DWI · axial · 3.0mm · 0.88mm/px · z∈[-68,+94]mm · 8 of 111 slices shown (1 of 4)]
[im 1/111]
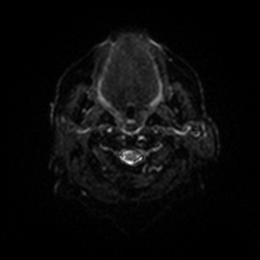
[im 16/111]
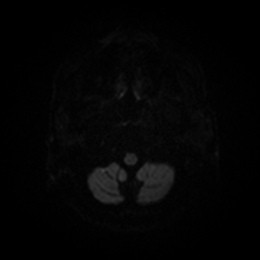
[im 32/111]
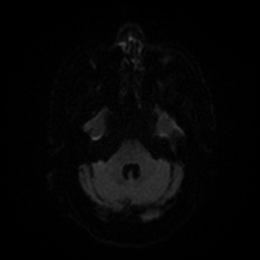
[im 48/111]
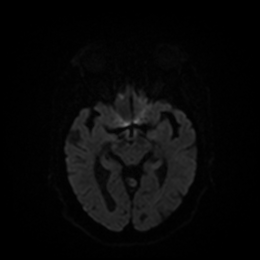
[im 63/111]
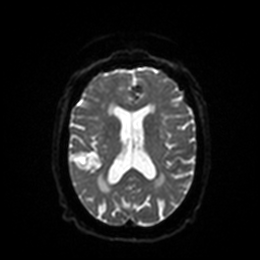
[im 79/111]
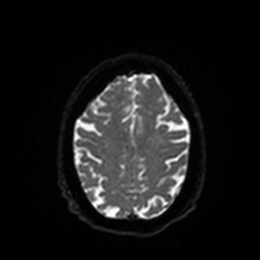
[im 95/111]
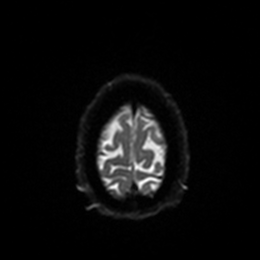
[im 111/111]
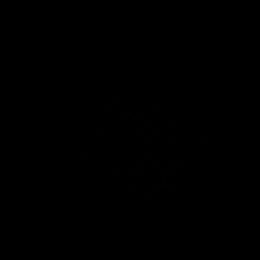

[Series 6: DWI · axial · 3.0mm · 0.88mm/px · z∈[-68,+91]mm · 4 of 55 slices shown (2 of 4)]
[im 1/55]
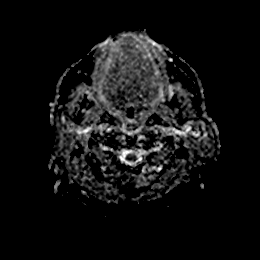
[im 19/55]
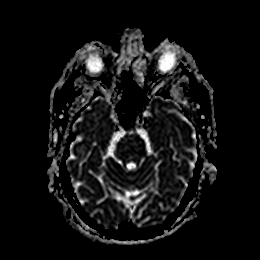
[im 37/55]
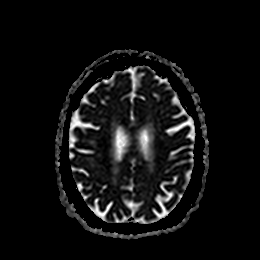
[im 55/55]
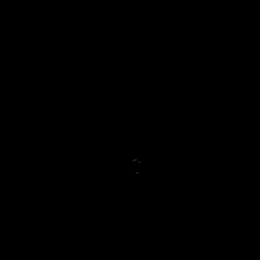

[Series 7: DWI · coronal · 4.0mm · 0.88mm/px · 5 of 76 slices shown (3 of 4)]
[im 1/76]
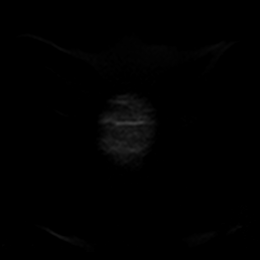
[im 19/76]
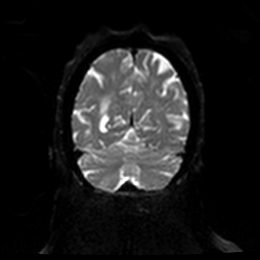
[im 38/76]
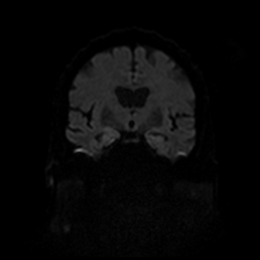
[im 57/76]
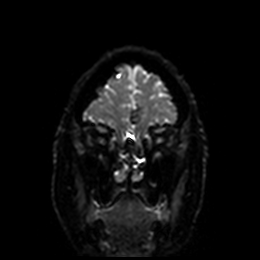
[im 76/76]
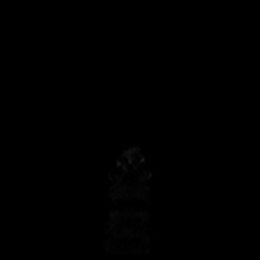

[Series 8: DWI · coronal · 4.0mm · 0.88mm/px · 3 of 38 slices shown (4 of 4)]
[im 1/38]
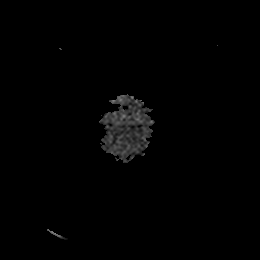
[im 19/38]
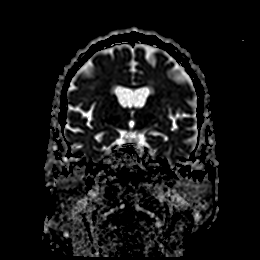
[im 38/38]
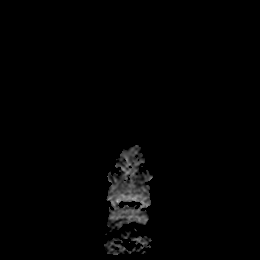

[Series 9: T1 · sagittal · 5.0mm · 0.75mm/px · 2 of 27 slices shown]
[im 1/27]
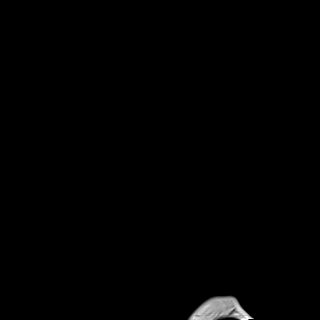
[im 27/27]
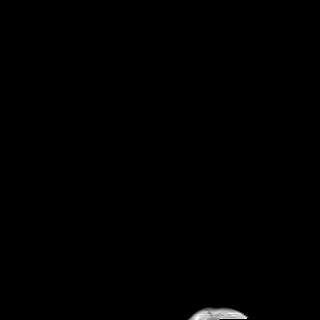

[Series 10: T2 · axial · 5.0mm · 0.72mm/px · z∈[-67,+93]mm · 2 of 28 slices shown (1 of 2)]
[im 1/28]
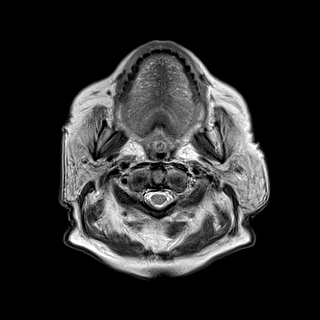
[im 28/28]
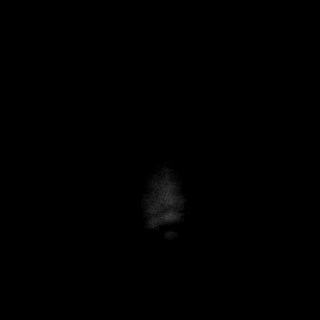

[Series 11: FLAIR · axial · 5.0mm · 0.45mm/px · z∈[-68,+91]mm · 2 of 28 slices shown]
[im 1/28]
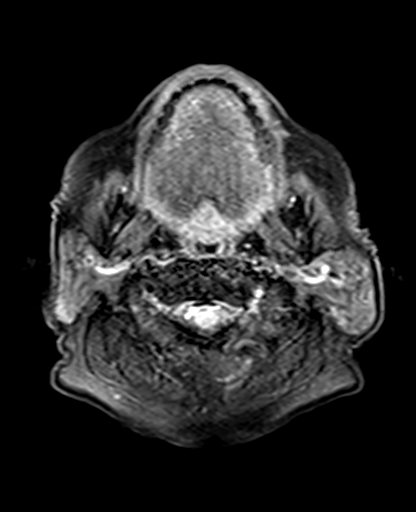
[im 28/28]
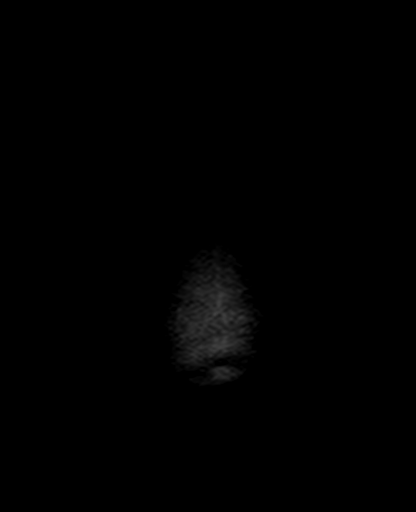

[Series 12: mag_images · axial · 3.0mm · 0.90mm/px · z∈[-73,+89]mm · 4 of 56 slices shown]
[im 1/56]
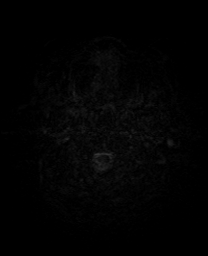
[im 19/56]
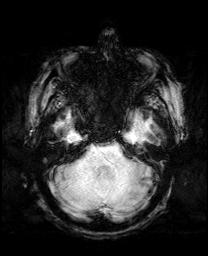
[im 37/56]
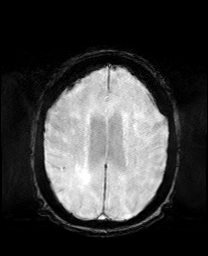
[im 56/56]
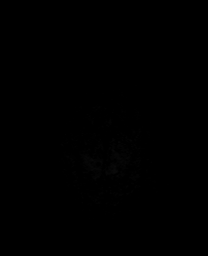

[Series 13: pha_images · axial · 3.0mm · 0.90mm/px · z∈[-67,+83]mm · 4 of 52 slices shown]
[im 1/52]
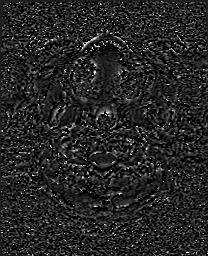
[im 18/52]
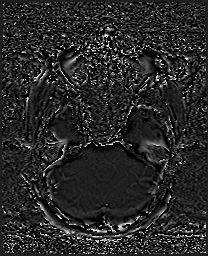
[im 35/52]
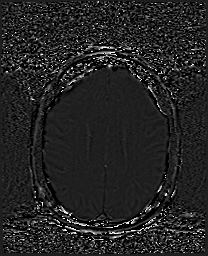
[im 52/52]
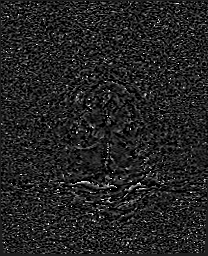

[Series 14: swi_images · axial · 3.0mm · 0.90mm/px · z∈[-73,+89]mm · 4 of 56 slices shown]
[im 1/56]
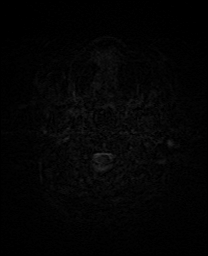
[im 19/56]
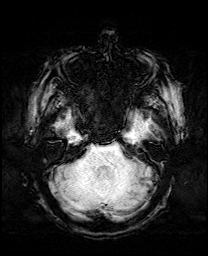
[im 37/56]
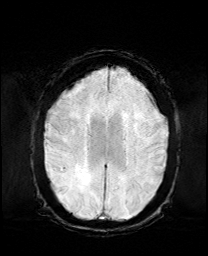
[im 56/56]
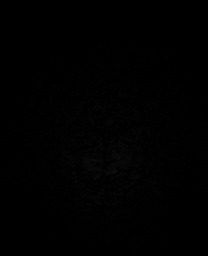

[Series 15: mip_images(sw) · axial · 24.0mm · 0.90mm/px · z∈[-63,+79]mm · 4 of 49 slices shown]
[im 1/49]
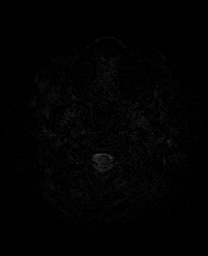
[im 17/49]
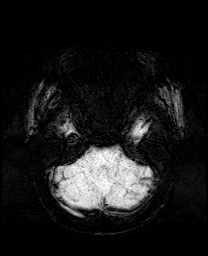
[im 33/49]
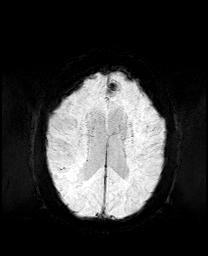
[im 49/49]
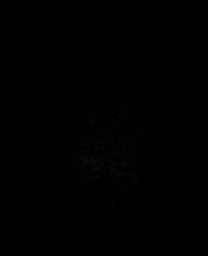

[Series 17: T2 · coronal · 5.0mm · 0.34mm/px · 2 of 32 slices shown (2 of 2)]
[im 1/32]
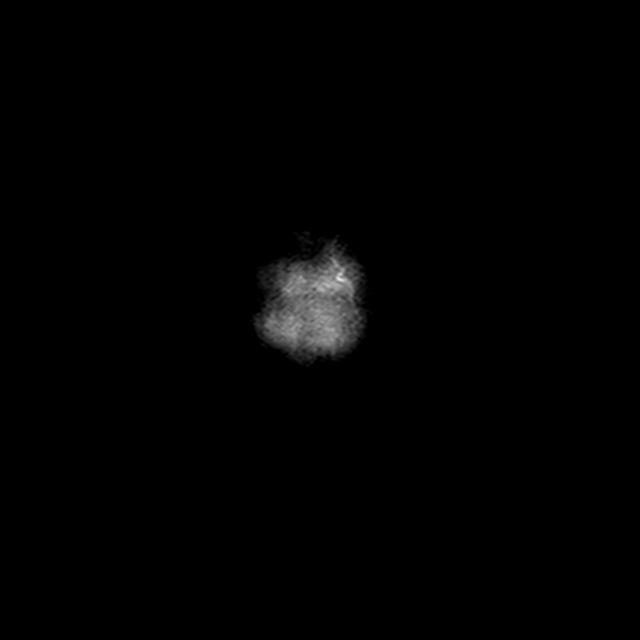
[im 32/32]
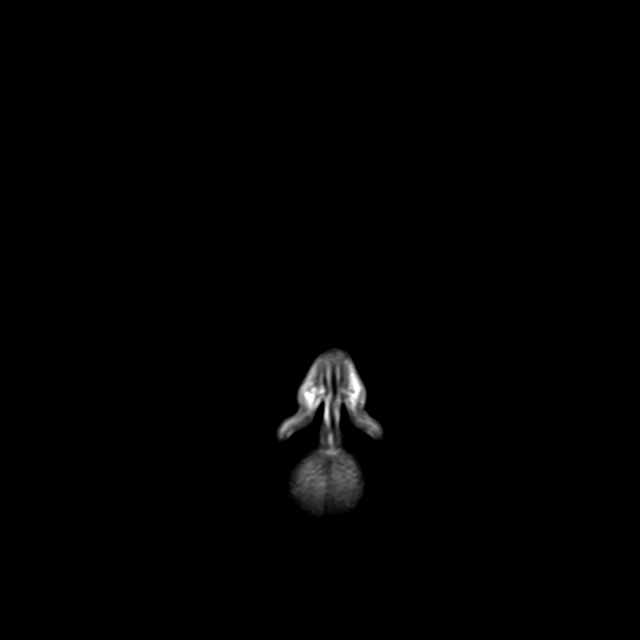

[44 of 48 positions shown; findings below may reference images not displayed]

FINDINGS: Brain: No acute infarction, hemorrhage, hydrocephalus, extra-axial
collection or mass effect. Anterior falx meningioma measuring 17 mm.
Local cortical mass effect without brain edema. Chronic small vessel
ischemia in the deep white matter, mild to moderate for age. Age
normal brain volume

Vascular: Normal flow voids

Skull and upper cervical spine: Normal marrow signal

Sinuses/Orbits: Bilateral cataract resection.
IMPRESSION: 1. Aging brain without acute finding or explanation for symptoms.
2. 17 mm anterior falcine meningioma.  No adjacent brain edema.

## 2024-02-04 ENCOUNTER — Ambulatory Visit: Payer: Medicare HMO | Admitting: Neurology
